# Patient Record
Sex: Male | Born: 1955 | Hispanic: Yes | Marital: Married | Smoking: Never smoker
Health system: Southern US, Community
[De-identification: ages and names within clinical notes are randomized; demographics above are authoritative.]

## PROBLEM LIST (undated history)

## (undated) DIAGNOSIS — I728 Aneurysm of other specified arteries: Secondary | ICD-10-CM

## (undated) DIAGNOSIS — I7121 Aneurysm of the ascending aorta, without rupture: Secondary | ICD-10-CM

## (undated) DIAGNOSIS — N182 Chronic kidney disease, stage 2 (mild): Secondary | ICD-10-CM

## (undated) DIAGNOSIS — M069 Rheumatoid arthritis, unspecified: Secondary | ICD-10-CM

## (undated) DIAGNOSIS — I712 Thoracic aortic aneurysm, without rupture: Secondary | ICD-10-CM

## (undated) DIAGNOSIS — K429 Umbilical hernia without obstruction or gangrene: Secondary | ICD-10-CM

## (undated) DIAGNOSIS — R600 Localized edema: Secondary | ICD-10-CM

## (undated) DIAGNOSIS — I509 Heart failure, unspecified: Secondary | ICD-10-CM

## (undated) DIAGNOSIS — E785 Hyperlipidemia, unspecified: Secondary | ICD-10-CM

## (undated) DIAGNOSIS — Z9989 Dependence on other enabling machines and devices: Secondary | ICD-10-CM

## (undated) DIAGNOSIS — I421 Obstructive hypertrophic cardiomyopathy: Secondary | ICD-10-CM

## (undated) DIAGNOSIS — I493 Ventricular premature depolarization: Secondary | ICD-10-CM

## (undated) DIAGNOSIS — I10 Essential (primary) hypertension: Secondary | ICD-10-CM

## (undated) DIAGNOSIS — M1A9XX0 Chronic gout, unspecified, without tophus (tophi): Secondary | ICD-10-CM

## (undated) DIAGNOSIS — I2510 Atherosclerotic heart disease of native coronary artery without angina pectoris: Secondary | ICD-10-CM

## (undated) DIAGNOSIS — R161 Splenomegaly, not elsewhere classified: Secondary | ICD-10-CM

## (undated) DIAGNOSIS — Z95810 Presence of automatic (implantable) cardiac defibrillator: Secondary | ICD-10-CM

## (undated) DIAGNOSIS — N433 Hydrocele, unspecified: Secondary | ICD-10-CM

## (undated) DIAGNOSIS — I4891 Unspecified atrial fibrillation: Secondary | ICD-10-CM

## (undated) DIAGNOSIS — I700 Atherosclerosis of aorta: Secondary | ICD-10-CM

## (undated) DIAGNOSIS — U071 COVID-19: Secondary | ICD-10-CM

## (undated) DIAGNOSIS — G4733 Obstructive sleep apnea (adult) (pediatric): Secondary | ICD-10-CM

## (undated) DIAGNOSIS — R002 Palpitations: Secondary | ICD-10-CM

## (undated) DIAGNOSIS — J1282 Pneumonia due to coronavirus disease 2019: Secondary | ICD-10-CM

## (undated) HISTORY — DX: Essential (primary) hypertension: I10

## (undated) HISTORY — DX: Aneurysm of the ascending aorta, without rupture: I71.21

## (undated) HISTORY — PX: UMBILICAL HERNIA REPAIR: SHX196

## (undated) HISTORY — DX: Thoracic aortic aneurysm, without rupture: I71.2

## (undated) HISTORY — DX: Chronic kidney disease, stage 2 (mild): N18.2

## (undated) HISTORY — PX: INCISIONAL HERNIA REPAIR: SHX193

## (undated) HISTORY — DX: Aneurysm of other specified arteries: I72.8

## (undated) HISTORY — PX: LAPAROSCOPIC INGUINAL HERNIA REPAIR: SUR788

## (undated) HISTORY — DX: Hyperlipidemia, unspecified: E78.5

## (undated) HISTORY — DX: Obstructive hypertrophic cardiomyopathy: I42.1

## (undated) HISTORY — DX: Umbilical hernia without obstruction or gangrene: K42.9

## (undated) HISTORY — DX: Ventricular premature depolarization: I49.3

---
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
## 2008-12-06 ENCOUNTER — Encounter: Payer: Self-pay | Admitting: Gastroenterology

## 2009-02-23 ENCOUNTER — Ambulatory Visit: Payer: Self-pay

## 2010-11-16 ENCOUNTER — Ambulatory Visit: Payer: Self-pay | Admitting: Cardiology

## 2011-01-11 NOTE — Letter (Signed)
Summary: Pre Visit No Show Letter  [facility]  [address] [place], [place] [address]   Phone: [phone]  Fax: [phone]        December 06, 2008 MRN: [id number]    KAYE MITRO [address] [place], [place]  [address]    Dear Mr. Valenti,   We have been unable to reach you by phone concerning the pre-procedure visit that you missed on 12-06-08. For this reason,your procedure scheduled on          12-17-08  has been cancelled. Our scheduling staff will gladly assist you with rescheduling your appointments at a more convenient time. Please call our office at [phone] between the hours of 8:00am and 5:00pm, press option #2 to reach an appointment scheduler. Please consider updating your contact numbers at this time so that we can reach you by phone in the future with schedule changes or results.    Thank you,    Sherren Kerns RN [facility]

## 2011-03-07 ENCOUNTER — Emergency Department (HOSPITAL_COMMUNITY)
Admission: EM | Admit: 2011-03-07 | Discharge: 2011-03-07 | Disposition: A | Discharge status: Home / Self Care | Payer: BC Managed Care – PPO | Attending: Emergency Medicine | Admitting: Emergency Medicine

## 2011-03-07 DIAGNOSIS — I4949 Other premature depolarization: Secondary | ICD-10-CM | POA: Insufficient documentation

## 2011-03-07 DIAGNOSIS — E876 Hypokalemia: Secondary | ICD-10-CM | POA: Insufficient documentation

## 2011-03-07 DIAGNOSIS — R002 Palpitations: Secondary | ICD-10-CM | POA: Insufficient documentation

## 2011-03-07 DIAGNOSIS — I440 Atrioventricular block, first degree: Secondary | ICD-10-CM | POA: Insufficient documentation

## 2011-03-07 LAB — DIFFERENTIAL
Basophils Absolute: 0 10*3/uL (ref 0.0–0.1)
Basophils Relative: 1 % (ref 0–1)
Eosinophils Absolute: 0.1 10*3/uL (ref 0.0–0.7)
Eosinophils Relative: 1 % (ref 0–5)
Lymphocytes Relative: 26 % (ref 12–46)
Lymphs Abs: 1.5 10*3/uL (ref 0.7–4.0)
Monocytes Absolute: 0.4 10*3/uL (ref 0.1–1.0)
Monocytes Relative: 8 % (ref 3–12)
Neutro Abs: 3.6 10*3/uL (ref 1.7–7.7)
Neutrophils Relative %: 64 % (ref 43–77)

## 2011-03-07 LAB — BASIC METABOLIC PANEL
BUN: 15 mg/dL (ref 6–23)
CO2: 25 mEq/L (ref 19–32)
Calcium: 9.2 mg/dL (ref 8.4–10.5)
Chloride: 105 mEq/L (ref 96–112)
Creatinine, Ser: 1.12 mg/dL (ref 0.4–1.5)
GFR calc Af Amer: >60 mL/min (ref >60)
GFR calc non Af Amer: >60 mL/min (ref >60)
Glucose, Bld: 110 mg/dL — ABNORMAL HIGH (ref 70–99)
Potassium: 3 mEq/L — ABNORMAL LOW (ref 3.5–5.1)
Sodium: 139 mEq/L (ref 135–145)

## 2011-03-07 LAB — CBC
HCT: 40.6 % (ref 39.0–52.0)
Hemoglobin: 14.5 g/dL (ref 13.0–17.0)
MCH: 31.9 pg (ref 26.0–34.0)
MCHC: 35.7 g/dL (ref 30.0–36.0)
MCV: 89.2 fL (ref 78.0–100.0)
Platelets: 195 10*3/uL (ref 150–400)
RBC: 4.55 MIL/uL (ref 4.22–5.81)
RDW: 12.9 % (ref 11.5–15.5)
WBC: 5.6 10*3/uL (ref 4.0–10.5)

## 2012-06-06 ENCOUNTER — Encounter (INDEPENDENT_AMBULATORY_CARE_PROVIDER_SITE_OTHER): Payer: Self-pay | Admitting: General Surgery

## 2012-06-09 ENCOUNTER — Ambulatory Visit (INDEPENDENT_AMBULATORY_CARE_PROVIDER_SITE_OTHER): Payer: BC Managed Care – PPO | Admitting: General Surgery

## 2012-07-22 ENCOUNTER — Encounter (INDEPENDENT_AMBULATORY_CARE_PROVIDER_SITE_OTHER): Payer: Self-pay | Admitting: General Surgery

## 2012-07-23 ENCOUNTER — Ambulatory Visit (INDEPENDENT_AMBULATORY_CARE_PROVIDER_SITE_OTHER): Payer: BC Managed Care – PPO | Admitting: General Surgery

## 2014-04-16 DIAGNOSIS — R079 Chest pain, unspecified: Secondary | ICD-10-CM | POA: Insufficient documentation

## 2014-04-16 DIAGNOSIS — R002 Palpitations: Secondary | ICD-10-CM | POA: Insufficient documentation

## 2014-04-16 DIAGNOSIS — R001 Bradycardia, unspecified: Secondary | ICD-10-CM | POA: Insufficient documentation

## 2014-04-27 DIAGNOSIS — G4730 Sleep apnea, unspecified: Secondary | ICD-10-CM | POA: Insufficient documentation

## 2014-04-27 DIAGNOSIS — E876 Hypokalemia: Secondary | ICD-10-CM

## 2014-04-27 HISTORY — DX: Hypokalemia: E87.6

## 2015-02-22 DIAGNOSIS — M1A0720 Idiopathic chronic gout, left ankle and foot, without tophus (tophi): Secondary | ICD-10-CM | POA: Insufficient documentation

## 2015-02-22 DIAGNOSIS — M1710 Unilateral primary osteoarthritis, unspecified knee: Secondary | ICD-10-CM | POA: Insufficient documentation

## 2015-02-22 DIAGNOSIS — M545 Low back pain, unspecified: Secondary | ICD-10-CM | POA: Insufficient documentation

## 2015-02-22 DIAGNOSIS — I10 Essential (primary) hypertension: Secondary | ICD-10-CM | POA: Insufficient documentation

## 2015-02-22 DIAGNOSIS — R600 Localized edema: Secondary | ICD-10-CM | POA: Insufficient documentation

## 2015-02-22 DIAGNOSIS — Z0000 Encounter for general adult medical examination without abnormal findings: Secondary | ICD-10-CM | POA: Insufficient documentation

## 2015-04-20 DIAGNOSIS — R1031 Right lower quadrant pain: Secondary | ICD-10-CM | POA: Insufficient documentation

## 2015-11-09 ENCOUNTER — Other Ambulatory Visit: Payer: Self-pay

## 2015-11-10 LAB — POTASSIUM: Potassium: 3.6 mmol/L (ref 3.5–5.2)

## 2015-11-10 LAB — URIC ACID: Uric Acid: 7.2 mg/dL (ref 3.7–8.6)

## 2015-12-14 DIAGNOSIS — K635 Polyp of colon: Secondary | ICD-10-CM | POA: Insufficient documentation

## 2015-12-14 DIAGNOSIS — R1031 Right lower quadrant pain: Secondary | ICD-10-CM | POA: Insufficient documentation

## 2015-12-14 DIAGNOSIS — M419 Scoliosis, unspecified: Secondary | ICD-10-CM | POA: Insufficient documentation

## 2016-01-10 DIAGNOSIS — J309 Allergic rhinitis, unspecified: Secondary | ICD-10-CM | POA: Insufficient documentation

## 2016-01-10 DIAGNOSIS — M1A9XX0 Chronic gout, unspecified, without tophus (tophi): Secondary | ICD-10-CM | POA: Insufficient documentation

## 2016-01-10 DIAGNOSIS — G4733 Obstructive sleep apnea (adult) (pediatric): Secondary | ICD-10-CM | POA: Insufficient documentation

## 2016-01-10 DIAGNOSIS — Z79899 Other long term (current) drug therapy: Secondary | ICD-10-CM | POA: Insufficient documentation

## 2016-02-16 ENCOUNTER — Other Ambulatory Visit: Payer: Self-pay | Admitting: Family Medicine

## 2016-02-17 LAB — ELECTROLYTE PANEL
CO2: 19 mmol/L (ref 18–29)
Chloride: 100 mmol/L (ref 96–106)
Potassium: 3.2 mmol/L — ABNORMAL LOW (ref 3.5–5.2)
Sodium: 141 mmol/L (ref 134–144)

## 2016-02-17 LAB — URIC ACID: Uric Acid: 8.3 mg/dL (ref 3.7–8.6)

## 2016-07-10 ENCOUNTER — Other Ambulatory Visit: Payer: Self-pay | Admitting: Family Medicine

## 2016-07-11 LAB — COMPREHENSIVE METABOLIC PANEL
ALT: 22 IU/L (ref 0–44)
AST: 22 IU/L (ref 0–40)
Albumin/Globulin Ratio: 1.9 (ref 1.2–2.2)
Albumin: 4.7 g/dL (ref 3.6–4.8)
Alkaline Phosphatase: 101 IU/L (ref 39–117)
BUN/Creatinine Ratio: 22 (ref 10–24)
BUN: 20 mg/dL (ref 8–27)
Bilirubin Total: 0.5 mg/dL (ref 0.0–1.2)
CO2: 21 mmol/L (ref 18–29)
Calcium: 9.6 mg/dL (ref 8.6–10.2)
Chloride: 98 mmol/L (ref 96–106)
Creatinine, Ser: 0.9 mg/dL (ref 0.76–1.27)
GFR calc Af Amer: 107 mL/min/{1.73_m2} (ref >59)
GFR calc non Af Amer: 93 mL/min/{1.73_m2} (ref >59)
Globulin, Total: 2.5 g/dL (ref 1.5–4.5)
Glucose: 85 mg/dL (ref 65–99)
Potassium: 3.9 mmol/L (ref 3.5–5.2)
Sodium: 139 mmol/L (ref 134–144)
Total Protein: 7.2 g/dL (ref 6.0–8.5)

## 2016-07-11 LAB — URIC ACID: Uric Acid: 9.1 mg/dL — ABNORMAL HIGH (ref 3.7–8.6)

## 2016-09-18 ENCOUNTER — Other Ambulatory Visit: Payer: Self-pay | Admitting: Family Medicine

## 2016-09-19 LAB — CMP12+LP+TP+TSH+6AC+PSA+CBC…
ALT: 28 IU/L (ref 0–44)
AST: 30 IU/L (ref 0–40)
Albumin/Globulin Ratio: 1.8 (ref 1.2–2.2)
Albumin: 4.7 g/dL (ref 3.6–4.8)
Alkaline Phosphatase: 80 IU/L (ref 39–117)
BUN/Creatinine Ratio: 18 (ref 10–24)
BUN: 20 mg/dL (ref 8–27)
Basophils Absolute: 0 10*3/uL (ref 0.0–0.2)
Basos: 1 %
Bilirubin Total: 0.8 mg/dL (ref 0.0–1.2)
Calcium: 9.3 mg/dL (ref 8.6–10.2)
Chloride: 100 mmol/L (ref 96–106)
Chol/HDL Ratio: 5 ratio units (ref 0.0–5.0)
Cholesterol, Total: 196 mg/dL (ref 100–199)
Creatinine, Ser: 1.11 mg/dL (ref 0.76–1.27)
EOS (ABSOLUTE): 0.1 10*3/uL (ref 0.0–0.4)
Eos: 2 %
Estimated CHD Risk: 1 times avg. (ref 0.0–1.0)
Free Thyroxine Index: 2.3 (ref 1.2–4.9)
GFR calc Af Amer: 83 mL/min/{1.73_m2} (ref >59)
GFR calc non Af Amer: 72 mL/min/{1.73_m2} (ref >59)
GGT: 30 IU/L (ref 0–65)
Globulin, Total: 2.6 g/dL (ref 1.5–4.5)
Glucose: 90 mg/dL (ref 65–99)
HDL: 39 mg/dL — ABNORMAL LOW (ref >39)
Hematocrit: 44.9 % (ref 37.5–51.0)
Hemoglobin: 15.6 g/dL (ref 12.6–17.7)
Immature Grans (Abs): 0 10*3/uL (ref 0.0–0.1)
Immature Granulocytes: 0 %
Iron: 149 ug/dL (ref 38–169)
LDH: 248 IU/L — ABNORMAL HIGH (ref 121–224)
LDL Calculated: 128 mg/dL — ABNORMAL HIGH (ref 0–99)
Lymphocytes Absolute: 1.5 10*3/uL (ref 0.7–3.1)
Lymphs: 28 %
MCH: 31.2 pg (ref 26.6–33.0)
MCHC: 34.7 g/dL (ref 31.5–35.7)
MCV: 90 fL (ref 79–97)
Monocytes Absolute: 0.4 10*3/uL (ref 0.1–0.9)
Monocytes: 8 %
Neutrophils Absolute: 3.3 10*3/uL (ref 1.4–7.0)
Neutrophils: 61 %
Phosphorus: 4.1 mg/dL (ref 2.5–4.5)
Platelets: 221 10*3/uL (ref 150–379)
Potassium: 4.1 mmol/L (ref 3.5–5.2)
Prostate Specific Ag, Serum: 1.2 ng/mL (ref 0.0–4.0)
RBC: 5 x10E6/uL (ref 4.14–5.80)
RDW: 13.7 % (ref 12.3–15.4)
Sodium: 139 mmol/L (ref 134–144)
T3 Uptake Ratio: 28 % (ref 24–39)
T4, Total: 8.3 ug/dL (ref 4.5–12.0)
TSH: 1.63 u[IU]/mL (ref 0.450–4.500)
Total Protein: 7.3 g/dL (ref 6.0–8.5)
Triglycerides: 146 mg/dL (ref 0–149)
Uric Acid: 9.4 mg/dL — ABNORMAL HIGH (ref 3.7–8.6)
VLDL Cholesterol Cal: 29 mg/dL (ref 5–40)
WBC: 5.4 10*3/uL (ref 3.4–10.8)

## 2016-09-19 LAB — HGB A1C W/O EAG: Hgb A1c MFr Bld: 5 % (ref 4.8–5.6)

## 2016-10-29 DIAGNOSIS — M25551 Pain in right hip: Secondary | ICD-10-CM | POA: Insufficient documentation

## 2017-01-04 ENCOUNTER — Ambulatory Visit: Payer: Self-pay | Admitting: *Deleted

## 2017-01-04 VITALS — BP 140/88 | HR 53 | Temp 98.8°F

## 2017-01-04 DIAGNOSIS — R0982 Postnasal drip: Secondary | ICD-10-CM

## 2017-01-04 NOTE — Progress Notes (Signed)
Pt in to clinic c/o sore, itchy throat. Sneezing. Denies cough, body aches, chills. Received flu vaccine this year. No fever present or reported. Given cough drops and Benadryl. Use Flonase spray.  F/u from cardiologist visit. Labs WNL in office. Discussed 48hr holter or ziopatch. Declined at that time due to being asymptomatic. F/u visit with cardiology in Feb. Will re-eval need. HR still irregular today, but less frequent irregularities.Return/ER precautions reviewed. No further questions.

## 2017-01-14 ENCOUNTER — Ambulatory Visit: Payer: Self-pay | Admitting: *Deleted

## 2017-01-14 VITALS — BP 154/80 | HR 58

## 2017-01-14 DIAGNOSIS — I10 Essential (primary) hypertension: Secondary | ICD-10-CM

## 2017-01-14 NOTE — Progress Notes (Signed)
Pt in for BP check prior to cardiology f/u appt tomorrow. HR still irregular. Appearing to skip after every 3rd beat. Pt continues to deny sx. Log of BPs sent to pt for appt.

## 2017-02-05 ENCOUNTER — Other Ambulatory Visit: Payer: Self-pay | Admitting: Registered Nurse

## 2017-02-05 ENCOUNTER — Encounter: Payer: Self-pay | Admitting: Registered Nurse

## 2017-02-07 ENCOUNTER — Ambulatory Visit: Payer: Self-pay | Admitting: Registered Nurse

## 2017-02-07 VITALS — BP 160/90 | HR 56

## 2017-02-07 DIAGNOSIS — J00 Acute nasopharyngitis [common cold]: Secondary | ICD-10-CM

## 2017-02-07 DIAGNOSIS — B349 Viral infection, unspecified: Secondary | ICD-10-CM

## 2017-02-07 DIAGNOSIS — R002 Palpitations: Secondary | ICD-10-CM

## 2017-02-07 NOTE — Progress Notes (Signed)
Medication reconciliation performed for medication refills. BP check for refills. Feels fatigued all day. Believes it is r/t the increase in BP med dosage. Denies HTN sx. Reports recent echo normal in setting of irreg HR.   Subjective:    Patient ID: Ryan Glass, male    DOB: 1956/04/29, 61 y.o.   MRN: [id number]  Married 60y/o hispanic male recently had BP medication dose change and echocardiogram for palpitations.  Coworkers sick with URI/flu in building.  He has been feeling fatigued/sweaty on and off with runny nose and muscle aches.  Nose sprays not helping.  Minimal leg swelling when he is taking his lasix.  Denied nausea/vomiting/diarrhea.  PMHx seasonal allergies, hypertension, OSA, colonic polyp, gout, hip pain, lumbago, palpitations, low potassium, hernia PSHx hernia repair and revision      Review of Systems  Constitutional: Positive for fatigue. Negative for activity change, appetite change, chills, diaphoresis, fever and unexpected weight change.  HENT: Positive for congestion, postnasal drip, rhinorrhea and sneezing. Negative for dental problem, drooling, ear discharge, ear pain, facial swelling, hearing loss, mouth sores, nosebleeds, sinus pain, sinus pressure, sore throat, tinnitus, trouble swallowing and voice change.   Eyes: Negative for photophobia, pain, discharge, redness, itching and visual disturbance.  Respiratory: Negative for cough, choking, chest tightness, shortness of breath, wheezing and stridor.   Cardiovascular: Negative for chest pain, palpitations and leg swelling.  Gastrointestinal: Negative for abdominal distention, abdominal pain, blood in stool, constipation, diarrhea, nausea and vomiting.  Endocrine: Negative for cold intolerance and heat intolerance.  Genitourinary: Negative for dysuria.  Musculoskeletal: Positive for myalgias. Negative for arthralgias, back pain, gait problem, joint swelling, neck pain and neck stiffness.  Skin: Negative for color  change, pallor, rash and wound.  Allergic/Immunologic: Positive for environmental allergies. Negative for food allergies and immunocompromised state.  Neurological: Negative for dizziness, tremors, seizures, syncope, facial asymmetry, speech difficulty, weakness, light-headedness, numbness and headaches.  Hematological: Negative for adenopathy. Does not bruise/bleed easily.  Psychiatric/Behavioral: Negative for agitation, behavioral problems, confusion and sleep disturbance.       Objective:   Physical Exam  Constitutional: He is oriented to person, place, and time. Vital signs are normal. He appears well-developed and well-nourished. He is active and cooperative.  Non-toxic appearance. He does not have a sickly appearance. He appears ill. No distress.  HENT:  Head: Normocephalic and atraumatic.  Right Ear: Hearing, external ear and ear canal normal. A middle ear effusion is present.  Left Ear: Hearing, external ear and ear canal normal. A middle ear effusion is present.  Nose: Mucosal edema and rhinorrhea present. No nose lacerations, sinus tenderness, nasal deformity, septal deviation or nasal septal hematoma. No epistaxis.  No foreign bodies. Right sinus exhibits no maxillary sinus tenderness and no frontal sinus tenderness. Left sinus exhibits no maxillary sinus tenderness and no frontal sinus tenderness.  Mouth/Throat: Uvula is midline and mucous membranes are normal. Mucous membranes are not pale, not dry and not cyanotic. He does not have dentures. No oral lesions. No trismus in the jaw. Normal dentition. No dental abscesses, uvula swelling, lacerations or dental caries. Posterior oropharyngeal edema and posterior oropharyngeal erythema present. No oropharyngeal exudate or tonsillar abscesses.  Bilateral allergic shiners; cobblestoning posterior pharynx; bilateral nasal turbinates edema/erythema white discharge; bilateral TMs with air fluid level  Eyes: Conjunctivae, EOM and lids are normal.  Pupils are equal, round, and reactive to light. Right eye exhibits no chemosis, no discharge, no exudate and no hordeolum. No foreign body present in the right eye.  Left eye exhibits no chemosis, no discharge, no exudate and no hordeolum. No foreign body present in the left eye. Right conjunctiva is not injected. Right conjunctiva has no hemorrhage. Left conjunctiva is not injected. Left conjunctiva has no hemorrhage. No scleral icterus. Right eye exhibits normal extraocular motion and no nystagmus. Left eye exhibits normal extraocular motion and no nystagmus. Right pupil is round and reactive. Left pupil is round and reactive. Pupils are equal.  Neck: Trachea normal and normal range of motion. Neck supple. No tracheal tenderness, no spinous process tenderness and no muscular tenderness present. No neck rigidity. No tracheal deviation, no edema, no erythema and normal range of motion present. No thyroid mass and no thyromegaly present.  Cardiovascular: Normal rate, S1 normal, S2 normal, normal heart sounds and intact distal pulses.  An irregularly irregular rhythm present. PMI is not displaced.  Exam reveals no gallop and no friction rub.   No murmur heard. Pulses:      Radial pulses are 2+ on the right side, and 2+ on the left side.  Pulmonary/Chest: Effort normal and breath sounds normal. No stridor. No respiratory distress. He has no decreased breath sounds. He has no wheezes. He has no rhonchi. He has no rales.  sp02 94% room air when speaking  Abdominal: Soft. He exhibits no distension.  Musculoskeletal: Normal range of motion. He exhibits no tenderness.       Right shoulder: Normal.       Left shoulder: Normal.       Right elbow: Normal.      Left elbow: Normal.       Right hip: Normal.       Left hip: Normal.       Right knee: Normal.       Left knee: Normal.       Right ankle: Normal.       Left ankle: Normal.       Cervical back: Normal.       Thoracic back: Normal.       Lumbar back:  Normal.       Right hand: Normal.       Left hand: Normal.       Right lower leg: He exhibits edema.       Left lower leg: He exhibits edema.  Minimal sock line bilateral lower extremities 0-1+/4 edema pitting  Lymphadenopathy:       Head (right side): No submental, no submandibular, no tonsillar, no preauricular, no posterior auricular and no occipital adenopathy present.       Head (left side): No submental, no submandibular, no tonsillar, no preauricular, no posterior auricular and no occipital adenopathy present.    He has no cervical adenopathy.       Right cervical: No superficial cervical, no deep cervical and no posterior cervical adenopathy present.      Left cervical: No superficial cervical, no deep cervical and no posterior cervical adenopathy present.  Neurological: He is alert and oriented to person, place, and time. He displays no atrophy and no tremor. No cranial nerve deficit or sensory deficit. He exhibits normal muscle tone. He displays no seizure activity. Coordination and gait normal. GCS eye subscore is 4. GCS verbal subscore is 5. GCS motor subscore is 6.  Skin: Skin is warm, dry and intact. No abrasion, no bruising, no burn, no ecchymosis, no laceration, no lesion, no petechiae and no rash noted. He is not diaphoretic. No cyanosis or erythema. No pallor. Nails show no clubbing.  Psychiatric: He has a normal mood and affect. His speech is normal and behavior is normal. Judgment and thought content normal. Cognition and memory are normal.  Nursing note and vitals reviewed.         Assessment & Plan:  A-acute rhinitis, viral illness, palpitations  P-Continue flonase 1 spray each nostril BID #1 RF0 Rx given from [facility], saline 2 sprays each nostril q2h prn congestion given 1 UD bottle from clinic stock. Shower twice a day.  Has diclofenac for pain.  Hydrate, rest, elevate legs. If no improvement with plan of care or worsening follow up with [facility] or provider for re-evaluation  .  No evidence of systemic bacterial infection, non toxic and well hydrated.  I do not see where any further testing or imaging is necessary at this time.   I will suggest supportive care, rest, good hygiene and encourage the patient to take adequate fluids.  The patient is to return to clinic or EMERGENCY ROOM if symptoms worsen or change significantly.  Patient verbalized agreement and understanding of treatment plan and had no further questions at this time.   P2:  Hand washing and cover cough  Patient recent EKG and echocardiogram for palpitations normal.  Had cardiology appt.  Discussed palpitations can be caused by medications, electrolyte levels being out of normal range, caffeine and/or illness.  Follow up with cardiology for re-evaluation if worsening in duration/frequency. Consider lab tests as last drawn January 2018. Patient verbalized understanding of information/instructions, agreed with plan of care and had no further questions at this time.

## 2017-02-07 NOTE — Patient Instructions (Addendum)
Start nasal saline 2 sprays each nostril every 2 hours while awake as needed Shower twice a day Continue flonase 1 spray each nostril twice a day Suspect viral illness/and start of seasonal allergy flare If nasonex at home could stop flonase and restart nasonex to see if that works better  Follow up with cardiology if worsening duration or frequency of palpitations/irregular heart beat

## 2017-02-28 ENCOUNTER — Ambulatory Visit: Payer: Self-pay | Admitting: *Deleted

## 2017-02-28 VITALS — BP 154/96 | HR 61

## 2017-02-28 DIAGNOSIS — I10 Essential (primary) hypertension: Secondary | ICD-10-CM

## 2017-02-28 DIAGNOSIS — R5383 Other fatigue: Secondary | ICD-10-CM

## 2017-02-28 NOTE — Progress Notes (Signed)
12:40: Fax received from Dr. America Brown office regarding encounter that was routed to MD. Atenolol "Change back to once daily" per Dr. Lady Gary. Tobi Bastos, CMA contacted pt and informed him and this RN spoke to pt as well. Pt verbalizes understanding to reduce dose back to once daily. No f/u instructions given per Dr. Lady Gary. Asked pt to see RN in one week for BP and HR check. He agrees. No further questions/concerns. Hard copy fax placed in pt's chart.

## 2017-02-28 NOTE — Progress Notes (Signed)
Pt requests BP check. Med change 01/15/17. Atenolol 25mg  increased from once to twice a day. Pt reports extreme fatigue all day now, even to the point of dozing off while driving one morning. Normally to bed at 11pm, now falling asleep early evening with difficulty getting up. Pulse now regular and WNL, 61. No irregularities noted. Pt believes fatigue is r/t dosage increase as it began shortly after starting new dose. Encouraged him to call Dr. office to discuss other options or need for OV. Pt verbalizes understanding and agreement.

## 2017-03-05 ENCOUNTER — Ambulatory Visit: Payer: Self-pay | Admitting: Registered Nurse

## 2017-03-05 VITALS — BP 154/90 | HR 57

## 2017-03-05 DIAGNOSIS — J301 Allergic rhinitis due to pollen: Secondary | ICD-10-CM

## 2017-03-05 DIAGNOSIS — H8113 Benign paroxysmal vertigo, bilateral: Secondary | ICD-10-CM

## 2017-03-05 DIAGNOSIS — H6593 Unspecified nonsuppurative otitis media, bilateral: Secondary | ICD-10-CM

## 2017-03-05 DIAGNOSIS — I10 Essential (primary) hypertension: Secondary | ICD-10-CM

## 2017-03-05 DIAGNOSIS — R002 Palpitations: Secondary | ICD-10-CM

## 2017-03-05 NOTE — Patient Instructions (Addendum)
Take medications as prescribed--refilled amlodipine 10mg  po daily #30 RF0 [facility] for patient contact PCM/cardiology office as patient would like to restart amlodipine 5mg  po BID instead of 10mg  po daily Contact cardiology/Dr Fath and notify him palpitations worsening ER if chest pain, shortness of breath, loss of consciousness, worst headache of life and/or visual changes  Allergic Rhinitis Allergic rhinitis is when the mucous membranes in the nose respond to allergens. Allergens are particles in the air that cause your body to have an allergic reaction. This causes you to release allergic antibodies. Through a chain of events, these eventually cause you to release histamine into the blood stream. Although meant to protect the body, it is this release of histamine that causes your discomfort, such as frequent sneezing, congestion, and an itchy, runny nose. What are the causes? Seasonal allergic rhinitis (hay fever) is caused by pollen allergens that may come from grasses, trees, and weeds. Year-round allergic rhinitis (perennial allergic rhinitis) is caused by allergens such as house dust mites, pet dander, and mold spores. What are the signs or symptoms? Nasal stuffiness (congestion). Itchy, runny nose with sneezing and tearing of the eyes. How is this diagnosed? Your health care provider can help you determine the allergen or allergens that trigger your symptoms. If you and your health care provider are unable to determine the allergen, skin or blood testing may be used. Your health care provider will diagnose your condition after taking your health history and performing a physical exam. Your health care provider may assess you for other related conditions, such as asthma, pink eye, or an ear infection. How is this treated? Allergic rhinitis does not have a cure, but it can be controlled by: Medicines that block allergy symptoms. These may include allergy shots, nasal sprays, and oral  antihistamines. Avoiding the allergen. Hay fever may often be treated with antihistamines in pill or nasal spray forms. Antihistamines block the effects of histamine. There are over-the-counter medicines that may help with nasal congestion and swelling around the eyes. Check with your health care provider before taking or giving this medicine. If avoiding the allergen or the medicine prescribed do not work, there are many new medicines your health care provider can prescribe. Stronger medicine may be used if initial measures are ineffective. Desensitizing injections can be used if medicine and avoidance does not work. Desensitization is when a patient is given ongoing shots until the body becomes less sensitive to the allergen. Make sure you follow up with your health care provider if problems continue. Follow these instructions at home: It is not possible to completely avoid allergens, but you can reduce your symptoms by taking steps to limit your exposure to them. It helps to know exactly what you are allergic to so that you can avoid your specific triggers. Contact a health care provider if: You have a fever. You develop a cough that does not stop easily (persistent). You have shortness of breath. You start wheezing. Symptoms interfere with normal daily activities. This information is not intended to replace advice given to you by your health care provider. Make sure you discuss any questions you have with your health care provider. Document Released: 08/21/2001 Document Revised: 07/27/2016 Document Reviewed: 08/03/2013 Elsevier Interactive Patient Education  2017 [employer].  Palpitations A palpitation is the feeling that your heartbeat is irregular or is faster than normal. It may feel like your heart is fluttering or skipping a beat. Palpitations are usually not a serious problem. They may be caused by  many things, including smoking, caffeine, alcohol, stress, and certain medicines.  Although most causes of palpitations are not serious, palpitations can be a sign of a serious medical problem. In some cases, you may need further medical evaluation. Follow these instructions at home: Pay attention to any changes in your symptoms. Take these actions to help with your condition:  Avoid the following:  Caffeinated coffee, tea, soft drinks, diet pills, and energy drinks.  Chocolate.  Alcohol.  Do not use any tobacco products, such as cigarettes, chewing tobacco, and e-cigarettes. If you need help quitting, ask your health care provider.  Try to reduce your stress and anxiety. Things that can help you relax include:  Yoga.  Meditation.  Physical activity, such as swimming, jogging, or walking.  Biofeedback. This is a method that helps you learn to use your mind to control things in your body, such as your heartbeats.  Get plenty of rest and sleep.  Take over-the-counter and prescription medicines only as told by your health care provider.  Keep all follow-up visits as told by your health care provider. This is important. Contact a health care provider if:  You continue to have a fast or irregular heartbeat after 24 hours.  Your palpitations occur more often. Get help right away if:  You have chest pain or shortness of breath.  You have a severe headache.  You feel dizzy or you faint. This information is not intended to replace advice given to you by your health care provider. Make sure you discuss any questions you have with your health care provider. Document Released: 11/23/2000 Document Revised: 04/30/2016 Document Reviewed: 08/11/2015 Elsevier Interactive Patient Education  2017 [employer]  Benign Positional Vertigo Vertigo is the feeling that you or your surroundings are moving when they are not. Benign positional vertigo is the most common form of vertigo. The cause of this condition is not serious (is benign). This condition is triggered by  certain movements and positions (is positional). This condition can be dangerous if it occurs while you are doing something that could endanger you or others, such as driving. What are the causes? In many cases, the cause of this condition is not known. It may be caused by a disturbance in an area of the inner ear that helps your brain to sense movement and balance. This disturbance can be caused by a viral infection (labyrinthitis), head injury, or repetitive motion. What increases the risk? This condition is more likely to develop in:  Women.  People who are 30 years of age or older. What are the signs or symptoms? Symptoms of this condition usually happen when you move your head or your eyes in different directions. Symptoms may start suddenly, and they usually last for less than a minute. Symptoms may include:  Loss of balance and falling.  Feeling like you are spinning or moving.  Feeling like your surroundings are spinning or moving.  Nausea and vomiting.  Blurred vision.  Dizziness.  Involuntary eye movement (nystagmus). Symptoms can be mild and cause only slight annoyance, or they can be severe and interfere with daily life. Episodes of benign positional vertigo may return (recur) over time, and they may be triggered by certain movements. Symptoms may improve over time. How is this diagnosed? This condition is usually diagnosed by medical history and a physical exam of the head, neck, and ears. You may be referred to a health care provider who specializes in ear, nose, and throat (ENT) problems (otolaryngologist) or a provider  who specializes in disorders of the nervous system (neurologist). You may have additional testing, including:  MRI.  A CT scan.  Eye movement tests. Your health care provider may ask you to change positions quickly while he or she watches you for symptoms of benign positional vertigo, such as nystagmus. Eye movement may be tested with an  electronystagmogram (ENG), caloric stimulation, the Dix-Hallpike test, or the roll test.  An electroencephalogram (EEG). This records electrical activity in your brain.  Hearing tests. How is this treated? Usually, your health care provider will treat this by moving your head in specific positions to adjust your inner ear back to normal. Surgery may be needed in severe cases, but this is rare. In some cases, benign positional vertigo may resolve on its own in 2-4 weeks. Follow these instructions at home: Safety   Move slowly.Avoid sudden body or head movements.  Avoid driving.  Avoid operating heavy machinery.  Avoid doing any tasks that would be dangerous to you or others if a vertigo episode would occur.  If you have trouble walking or keeping your balance, try using a cane for stability. If you feel dizzy or unstable, sit down right away.  Return to your normal activities as told by your health care provider. Ask your health care provider what activities are safe for you. General instructions   Take over-the-counter and prescription medicines only as told by your health care provider.  Avoid certain positions or movements as told by your health care provider.  Drink enough fluid to keep your urine clear or pale yellow.  Keep all follow-up visits as told by your health care provider. This is important. Contact a health care provider if:  You have a fever.  Your condition gets worse or you develop new symptoms.  Your family or friends notice any behavioral changes.  Your nausea or vomiting gets worse.  You have numbness or a "pins and needles" sensation. Get help right away if:  You have difficulty speaking or moving.  You are always dizzy.  You faint.  You develop severe headaches.  You have weakness in your legs or arms.  You have changes in your hearing or vision.  You develop a stiff neck.  You develop sensitivity to light. This information is not  intended to replace advice given to you by your health care provider. Make sure you discuss any questions you have with your health care provider. Document Released: 09/03/2006 Document Revised: 05/03/2016 Document Reviewed: 03/21/2015 Elsevier Interactive Patient Education  2017 [employer].

## 2017-03-05 NOTE — Progress Notes (Signed)
Subjective:    Patient ID: Ryan Glass, male    DOB: 15-Jul-1956, 61 y.o.   MRN: [id number]  60y/o married hispanic male here for not feeling well, dizzyness and palpitations again. Had normal echocardiogram with cardiology Dr Lady Gary at [facility] earlier this year.  Dr Lady Gary prescribed for him to take atenolol 25mg  po BID but patient did not tolerate dosing--falling asleep at work and while driving February so Dr March adjusted dose back to 25mg  po daily.  Patient was feeling better until he ran out of amlodipine last week.  Here today requesting refill of his amlodipine as cannot find his bottle at home.  Last filled January 2018 per paper chart review [facility].  Patient stated ran out of amlodipine 5 days ago still taking all other medications as prescribed. Patient asking if I could change his dosing to 5mg  po BID instead of 10mg  po daily.  Patient reported he has less leg swelling when he was on BID 5mg  dosing.  Patient reported if he takes amlodipine in am has afternoon leg swelling.  If he takes in evening has less leg swelling in am then what occurs in pm with am dosing.  Least leg swelling when he splits dose and takes amlodipine BID. Seasonal allergies acting up also has been using flonase and nasal saline with some relief of symptoms.  Hasn't tried showering prior to bed.  Denied recent illness/cold, swelling of hands/feet, passing out/LOC, visual changes, headache, fever, chills, ear discharge, or rash.      Review of Systems  Constitutional: Negative for activity change, appetite change, chills, diaphoresis, fatigue, fever and unexpected weight change.  HENT: Positive for congestion, postnasal drip and rhinorrhea. Negative for dental problem, drooling, ear discharge, ear pain, facial swelling, hearing loss, mouth sores, nosebleeds, sinus pain, sinus pressure, sneezing, sore throat, tinnitus, trouble swallowing and voice change.   Eyes: Negative for photophobia, pain,  discharge, redness, itching and visual disturbance.  Respiratory: Negative for cough, choking, chest tightness, shortness of breath, wheezing and stridor.   Cardiovascular: Positive for palpitations and leg swelling. Negative for chest pain.  Gastrointestinal: Negative for abdominal distention, abdominal pain, blood in stool, constipation, diarrhea, nausea and vomiting.  Endocrine: Negative for cold intolerance and heat intolerance.  Genitourinary: Negative for dysuria.  Musculoskeletal: Negative for arthralgias, back pain, gait problem, joint swelling, myalgias, neck pain and neck stiffness.  Skin: Negative for color change, pallor, rash and wound.  Allergic/Immunologic: Positive for environmental allergies. Negative for food allergies and immunocompromised state.  Neurological: Positive for dizziness and headaches. Negative for tremors, seizures, syncope, facial asymmetry, speech difficulty, weakness, light-headedness and numbness.  Hematological: Negative for adenopathy. Does not bruise/bleed easily.  Psychiatric/Behavioral: Negative for agitation, behavioral problems, confusion and sleep disturbance.       Objective:   Physical Exam  Constitutional: He is oriented to person, place, and time. Vital signs are normal. He appears well-developed and well-nourished. He is active and cooperative.  Non-toxic appearance. He does not have a sickly appearance. He does not appear ill. No distress.  HENT:  Head: Normocephalic and atraumatic.  Right Ear: Hearing, external ear and ear canal normal. A middle ear effusion is present.  Left Ear: Hearing, external ear and ear canal normal. A middle ear effusion is present.  Nose: Mucosal edema and rhinorrhea present. No nose lacerations, sinus tenderness, nasal deformity, septal deviation or nasal septal hematoma. No epistaxis.  No foreign bodies. Right sinus exhibits no maxillary sinus tenderness and no  frontal sinus tenderness. Left sinus exhibits no  maxillary sinus tenderness and no frontal sinus tenderness.  Mouth/Throat: Uvula is midline and mucous membranes are normal. Mucous membranes are not pale, not dry and not cyanotic. He does not have dentures. No oral lesions. No trismus in the jaw. Normal dentition. No dental abscesses, uvula swelling, lacerations or dental caries. Posterior oropharyngeal edema and posterior oropharyngeal erythema present. No oropharyngeal exudate or tonsillar abscesses.  Cobblestoning posterior pharynx; bilateral allergic shiners; bilateral TMs air fluid level clear; bilateral nasal turbinates edema/erythema clear discharge  Eyes: Conjunctivae, EOM and lids are normal. Pupils are equal, round, and reactive to light. Right eye exhibits no chemosis, no discharge, no exudate and no hordeolum. No foreign body present in the right eye. Left eye exhibits no chemosis, no discharge, no exudate and no hordeolum. No foreign body present in the left eye. Right conjunctiva is not injected. Right conjunctiva has no hemorrhage. Left conjunctiva is not injected. Left conjunctiva has no hemorrhage. No scleral icterus. Right eye exhibits normal extraocular motion and no nystagmus. Left eye exhibits normal extraocular motion and no nystagmus. Right pupil is round and reactive. Left pupil is round and reactive. Pupils are equal.  Neck: Trachea normal and normal range of motion. Neck supple. No tracheal tenderness, no spinous process tenderness and no muscular tenderness present. No neck rigidity. No tracheal deviation, no edema, no erythema and normal range of motion present. No thyroid mass and no thyromegaly present.  Cardiovascular: Normal rate, S1 normal, S2 normal and intact distal pulses.  An irregularly irregular rhythm present. PMI is not displaced.   Pulses:      Radial pulses are 2+ on the right side, and 2+ on the left side.  Pulmonary/Chest: Effort normal and breath sounds normal. No stridor. No respiratory distress. He has no  decreased breath sounds. He has no wheezes. He has no rhonchi. He has no rales.  Abdominal: Soft. He exhibits no distension.  Musculoskeletal: Normal range of motion. He exhibits no edema or tenderness.       Right shoulder: Normal.       Left shoulder: Normal.       Right elbow: Normal.      Left elbow: Normal.       Right hip: Normal.       Left hip: Normal.       Right knee: Normal.       Left knee: Normal.       Right hand: Normal.       Left hand: Normal.  Lymphadenopathy:       Head (right side): No submental, no submandibular, no tonsillar, no preauricular, no posterior auricular and no occipital adenopathy present.       Head (left side): No submental, no submandibular, no tonsillar, no preauricular, no posterior auricular and no occipital adenopathy present.    He has no cervical adenopathy.       Right cervical: No superficial cervical, no deep cervical and no posterior cervical adenopathy present.      Left cervical: No superficial cervical, no deep cervical and no posterior cervical adenopathy present.  Neurological: He is alert and oriented to person, place, and time. He has normal strength. He is not disoriented. He displays no atrophy and no tremor. No cranial nerve deficit or sensory deficit. He exhibits normal muscle tone. He displays no seizure activity. Coordination and gait normal. GCS eye subscore is 4. GCS verbal subscore is 5. GCS motor subscore is 6.  Bilateral hand  grasp equal 5/5; lower extremities 5/5 gait sure and steady in hallway; in/out chair without difficulty  Skin: Skin is warm, dry and intact. No abrasion, no bruising, no burn, no ecchymosis, no laceration, no lesion, no petechiae and no rash noted. He is not diaphoretic. No cyanosis or erythema. No pallor. Nails show no clubbing.  Psychiatric: He has a normal mood and affect. His speech is normal and behavior is normal. Judgment and thought content normal. Cognition and memory are normal.  Nursing note and  vitals reviewed.         Assessment & Plan:  A-Palpitations, Hypertension, Bilateral Otitis Media Effusion; vertigo; seasonal allergic rhinitis  P-Notify PCM/Cardiology Dr Lady Gary Palpitations worsening.  Had run out of amlodipine 10mg  po daily 5 days ago.  Filled 30 days supply for patient from [facility] today and he is to restart medication and follow [facility] with [facility] for BP rechecks this week.  Discussed palpitations could be due to his running out of amlodipine, electrolytes being high or low, caffeine, stress, illness.  Patient to go to ER if worsening, loss of consciousness, visual changes, chest pain, dyspnea/shortness of breath for EKG and labs.  Discussed with patient [facility] does not have EKG machine or same day lab capability.  Patient reported he will follow up with PCM/cardiology regarding amlodipine Rx to see if they will change Rx to 5mg  po BID dosing.  Take medications as presribed by PCM/cardiology.  Patient verbalized understanding information/instructions, agreed with plan of care and had no further questions at this time.  Continue current medications as directed.  Continue to monitor blood pressure at home and maintain log of blood pressure and pulse to bring to follow up appointments.  Continue low sodium diet and exercise program.  Recommended weight loss/weight maintenance to BMI 20-25.  Return to the clinic if any new symptoms.  Patient verbalized agreement and understanding of treatment plan and had no further questions at this time.    Discussed with patient otitis media with effusion probably causing vertigo but could also be age, palpitations, ran out of amlopidine, seasonal allergies.   Discussed signs/symptoms stroke.  Follow up if aphasia, dysphasia, visual changes, weakness, fall, worst headache of life, incoordination, fever, ear discharge. Holding meclizine/antihistamines at this time due to palpitations. Follow up if no  improvement after restarting seasonal allergy medications, amlodipine and showering twice a day plus hydrating. Patient verbalized understanding of information/agreed with plan of care and had no further questions at this time.  Supportive treatment.   No evidence of invasive bacterial infection, non toxic and well hydrated.  This is most likely self limiting viral infection.  I do not see where any further testing or imaging is necessary at this time.   I will suggest supportive care, rest, good hygiene and encourage the patient to take adequate fluids.  The patient is to return to clinic or EMERGENCY ROOM if symptoms worsen or change significantly e.g. ear pain, fever, purulent discharge from ears or bleeding.  Exitcare handout on otitis media with effusion given to patient.  Patient verbalized agreement and understanding of treatment plan.    Patient may use normal saline nasal spray as needed.  Flonase 1 spray each nostril BID at home.  Consider antihistamine but at this time did not recommend due to palpitations.  Other alternative singulair 10mg  po qhs if no improvement with shower at bedtime.  Consider antihistamine or nasal steroid use.  Avoid triggers if possible.  Shower prior  to bedtime if exposed to triggers.  If allergic dust/dust mites recommend mattress/pillow covers/encasements; washing linens, vacuuming, sweeping, dusting weekly.  Call or return to clinic as needed if these symptoms worsen or fail to improve as anticipated.   Exitcare handout on allergic rhinitis given to patient.  Patient verbalized understanding of instructions, agreed with plan of care and had no further questions at this time.  P2:  Avoidance and hand washing.

## 2017-03-08 ENCOUNTER — Ambulatory Visit: Payer: Self-pay | Admitting: *Deleted

## 2017-03-08 VITALS — BP 160/90

## 2017-03-08 DIAGNOSIS — I10 Essential (primary) hypertension: Secondary | ICD-10-CM

## 2017-03-08 NOTE — Progress Notes (Signed)
BP check. Elevated but improved from yesterday. Took extra atenolol dose yesterday afternoon after work. Regular dose at night and extra dose this am. Not as fatigued as he was previously on BID dosing. No palpitations and pulse regular today as opposed to having PVCs yesterday. Is going to try to go back to BID dosing of atenolol to control palpitations.

## 2017-03-18 ENCOUNTER — Ambulatory Visit: Payer: Self-pay | Admitting: *Deleted

## 2017-03-18 VITALS — BP 138/86 | HR 69

## 2017-03-18 DIAGNOSIS — I10 Essential (primary) hypertension: Secondary | ICD-10-CM

## 2017-03-18 NOTE — Progress Notes (Signed)
BP check. Irregular HR returned but PVCs occurring less often. Pt asymptomatic. Feels well on BP med regimen. Returned to twice daily atenolol. Plans to f/u with cardiology in 3-6 months and continue with interval BP checks in clinic.

## 2017-04-04 ENCOUNTER — Ambulatory Visit: Payer: Self-pay | Admitting: Registered Nurse

## 2017-04-04 VITALS — BP 152/105 | HR 57 | Temp 98.1°F | Wt 256.0 lb

## 2017-04-04 DIAGNOSIS — M7989 Other specified soft tissue disorders: Secondary | ICD-10-CM

## 2017-04-04 DIAGNOSIS — R002 Palpitations: Secondary | ICD-10-CM

## 2017-04-04 NOTE — Progress Notes (Signed)
Subjective:    Patient ID: Ryan Glass, male    DOB: 30-Jul-1956, 61 y.o.   MRN: [id number]  61y/o married hispanic male presents to clinic with c/o SOB, fatigue, BLE edema. Reports his amlodipine causes leg swelling normally so he did not take it today so it would not get worse. Normally trace-+1 edema, today +4 BLE.  Pt reports normally able to walk up 15-20 stairs without SOB, today only 3-4 stairs. Feels like he can't catch his breath, needs deeper breaths. Denies chest pain/pressure. Pulse regular today, irregular in recent past due to PVCs. Has taken Furosemide today. 256# today. Last recorded weight 254# at cardiology Office visit 01/15/17.   Allergies acting up had stopped nose spray as cardiology told him could be causing his palpations      Review of Systems  Constitutional: Positive for fatigue. Negative for activity change, appetite change, chills, diaphoresis, fever and unexpected weight change.  HENT: Positive for congestion, postnasal drip and rhinorrhea. Negative for dental problem, drooling, ear discharge, ear pain, facial swelling, hearing loss, mouth sores, nosebleeds, sinus pain, sinus pressure, sneezing, sore throat, tinnitus, trouble swallowing and voice change.   Eyes: Negative for photophobia, pain, discharge, redness, itching and visual disturbance.  Respiratory: Positive for shortness of breath. Negative for cough, choking, chest tightness, wheezing and stridor.   Cardiovascular: Negative for chest pain, palpitations and leg swelling.  Gastrointestinal: Negative for abdominal distention, abdominal pain, blood in stool, constipation, diarrhea, nausea and vomiting.  Endocrine: Negative for cold intolerance and heat intolerance.  Genitourinary: Negative for dysuria.  Musculoskeletal: Positive for joint swelling. Negative for arthralgias, back pain, gait problem, myalgias, neck pain and neck stiffness.  Skin: Negative for color change, pallor, rash and wound.   Allergic/Immunologic: Positive for environmental allergies. Negative for food allergies and immunocompromised state.  Neurological: Negative for dizziness, tremors, seizures, syncope, facial asymmetry, speech difficulty, weakness, light-headedness, numbness and headaches.  Hematological: Negative for adenopathy. Does not bruise/bleed easily.  Psychiatric/Behavioral: Negative for agitation, behavioral problems, confusion and sleep disturbance.       Objective:   Physical Exam  Constitutional: He is oriented to person, place, and time. He appears well-developed and well-nourished. He is active and cooperative.  Non-toxic appearance. He does not have a sickly appearance. He appears ill. No distress.  HENT:  Head: Normocephalic and atraumatic.  Right Ear: Hearing, external ear and ear canal normal. A middle ear effusion is present.  Left Ear: Hearing, external ear and ear canal normal. A middle ear effusion is present.  Nose: Mucosal edema and rhinorrhea present. No nose lacerations, sinus tenderness, nasal deformity, septal deviation or nasal septal hematoma. No epistaxis.  No foreign bodies. Right sinus exhibits no maxillary sinus tenderness and no frontal sinus tenderness. Left sinus exhibits no maxillary sinus tenderness and no frontal sinus tenderness.  Mouth/Throat: Uvula is midline and mucous membranes are normal. Mucous membranes are not pale, not dry and not cyanotic. He does not have dentures. No oral lesions. No trismus in the jaw. Normal dentition. No dental abscesses, uvula swelling, lacerations or dental caries. Posterior oropharyngeal edema and posterior oropharyngeal erythema present. No oropharyngeal exudate or tonsillar abscesses.  Cobblestoning posterior pharynx; bilateral TMs air fluid level clear; bilateral nasal turbinates edema/erythema clear discharge; bilateral allergic shiners  Eyes: Conjunctivae, EOM and lids are normal. Pupils are equal, round, and reactive to light. Right  eye exhibits no chemosis, no discharge, no exudate and no hordeolum. No foreign body present in the right eye. Left eye exhibits  no chemosis, no discharge, no exudate and no hordeolum. No foreign body present in the left eye. Right conjunctiva is not injected. Right conjunctiva has no hemorrhage. Left conjunctiva is not injected. Left conjunctiva has no hemorrhage. No scleral icterus. Right eye exhibits normal extraocular motion and no nystagmus. Left eye exhibits normal extraocular motion and no nystagmus. Right pupil is round and reactive. Left pupil is round and reactive. Pupils are equal.  Neck: Trachea normal and normal range of motion. Neck supple. No tracheal tenderness, no spinous process tenderness and no muscular tenderness present. No neck rigidity. No tracheal deviation, no edema, no erythema and normal range of motion present. No thyroid mass and no thyromegaly present.  Cardiovascular: Normal rate, S1 normal, S2 normal, normal heart sounds and intact distal pulses.  An irregularly irregular rhythm present. PMI is not displaced.  Exam reveals no gallop and no friction rub.   No murmur heard. Pulses:      Radial pulses are 2+ on the right side, and 2+ on the left side.  In 30 seconds had 4 beats pause; 12 beats pause; 5 beats pause; 4 beats pause; 8 beats pause; pitting edema to 2 inches below bilateral knees wearing ankle socks and tennies today  Pulmonary/Chest: Effort normal and breath sounds normal. No stridor. No respiratory distress. He has no decreased breath sounds. He has no wheezes. He has no rhonchi. He has no rales. He exhibits no tenderness.  Speaks full sentences but gets easily winded with activity sp02 within usual range  Abdominal: Soft. He exhibits no distension.  Musculoskeletal: Normal range of motion. He exhibits edema. He exhibits no tenderness or deformity.       Right shoulder: Normal.       Left shoulder: Normal.       Right elbow: Normal.      Left elbow: Normal.        Right hip: Normal.       Left hip: Normal.       Right knee: Normal.       Left knee: Normal.       Right ankle: He exhibits swelling and ecchymosis. He exhibits normal range of motion, no deformity, no laceration and normal pulse. Achilles tendon normal.       Left ankle: He exhibits swelling and ecchymosis. He exhibits normal range of motion, no deformity, no laceration and normal pulse. Achilles tendon normal.       Cervical back: Normal.       Right hand: Normal.       Left hand: Normal.       Right lower leg: He exhibits edema. He exhibits no tenderness, no bony tenderness, no swelling, no deformity and no laceration.       Left lower leg: He exhibits edema. He exhibits no tenderness, no bony tenderness, no swelling, no deformity and no laceration.  Lymphadenopathy:       Head (right side): No submental, no submandibular, no tonsillar, no preauricular, no posterior auricular and no occipital adenopathy present.       Head (left side): No submental, no submandibular, no tonsillar, no preauricular, no posterior auricular and no occipital adenopathy present.    He has no cervical adenopathy.       Right cervical: No superficial cervical, no deep cervical and no posterior cervical adenopathy present.      Left cervical: No superficial cervical, no deep cervical and no posterior cervical adenopathy present.  Neurological: He is alert and oriented to person,  place, and time. He is not disoriented. He displays no atrophy and no tremor. No cranial nerve deficit or sensory deficit. He exhibits normal muscle tone. He displays no seizure activity. Coordination and gait normal. GCS eye subscore is 4. GCS verbal subscore is 5. GCS motor subscore is 6.  Skin: Skin is warm, dry and intact. No abrasion, no bruising, no burn, no ecchymosis, no laceration, no lesion, no petechiae and no rash noted. He is not diaphoretic. No cyanosis or erythema. No pallor. Nails show no clubbing.  Psychiatric: He has a  normal mood and affect. His speech is normal and behavior is normal. Judgment and thought content normal. Cognition and memory are normal.  Nursing note and vitals reviewed.         Assessment & Plan:  A-hypertension, leg swelling, palpitations  P-contact cardiologist office regarding leg swelling and palpitations and weight gain.  Discussed they may increase his lasix dose and to discuss amlodipine dosing/change.  Labs today CMP and magnesium venipuncture x 1 drawn by RN Rolly Salter workman.  Avoid added sodium today.  Hydrate, elevate legs if able.  ER if chest pain, worsening dyspnea with exertion, worsening palpitations.  Continue current medications as prescribed.  Follow up with Rolly Salter tomorrow to review lab results  Continue to monitor blood pressure at home and maintain log of blood pressure and pulse to bring to follow up appointments.  Continue low sodium diet and exercise program.  Recommended weight loss/weight maintenance to BMI 20-25.  Return to the clinic if any new symptoms.  Patient verbalized agreement and understanding of treatment plan and had no further questions at this time.   P2:  Diet and Exercise specific for HTN

## 2017-04-04 NOTE — Patient Instructions (Addendum)
Call cardiologist office and discuss weight gain/worsening swelling legs and palpitations/irregular heart beat Avoid extra salt/caffeine today Blood draw today to check kidney function/electrolytes (potassium/magnesium) Follow up with Rolly Salter RN tomorrow for lab results and repeat weight/BP ER tonight if worsening palpitations, difficulty breathing, chest pain  Heart Failure Heart failure is a condition in which the heart has trouble pumping blood because it has become weak or stiff. This means that the heart does not pump blood efficiently for the body to work well. For some people with heart failure, fluid may back up into the lungs and there may be swelling (edema) in the lower legs. Heart failure is usually a long-term (chronic) condition. It is important for you to take good care of yourself and follow the treatment plan from your health care provider. What are the causes? This condition is caused by some health problems, including:  High blood pressure (hypertension). Hypertension causes the heart muscle to work harder than normal. High blood pressure eventually causes the heart to become stiff and weak.  Coronary artery disease (CAD). CAD is the buildup of cholesterol and fat (plaques) in the arteries of the heart.  Heart attack (myocardial infarction). Injured tissue, which is caused by the heart attack, does not contract as well and the heart's ability to pump blood is weakened.  Abnormal heart valves. When the heart valves do not open and close properly, the heart muscle must pump harder to keep the blood flowing.  Heart muscle disease (cardiomyopathy or myocarditis). Heart muscle disease is damage to the heart muscle from a variety of causes, such as drug or alcohol abuse, infections, or unknown causes. These can increase the risk of heart failure.  Lung disease. When the lungs do not work properly, the heart must work harder. What increases the risk? Risk of heart failure increases  as a person ages. This condition is also more likely to develop in people who:  Are overweight.  Are male.  Smoke or chew tobacco.  Abuse alcohol or illegal drugs.  Have taken medicines that can damage the heart, such as chemotherapy drugs.  Have diabetes.  High blood sugar (glucose) is associated with high fat (lipid) levels in the blood.  Diabetes can also damage tiny blood vessels that carry nutrients to the heart muscle.  Have abnormal heart rhythms.  Have thyroid problems.  Have low blood counts (anemia). What are the signs or symptoms? Symptoms of this condition include:  Shortness of breath with activity, such as when climbing stairs.  Persistent cough.  Swelling of the feet, ankles, legs, or abdomen.  Unexplained weight gain.  Difficulty breathing when lying flat (orthopnea).  Waking from sleep because of the need to sit up and get more air.  Rapid heartbeat.  Fatigue and loss of energy.  Feeling light-headed, dizzy, or close to fainting.  Loss of appetite.  Nausea.  Increased urination during the night (nocturia).  Confusion. How is this diagnosed? This condition is diagnosed based on:  Medical history, symptoms, and a physical exam.  Diagnostic tests, which may include:  Echocardiogram.  Electrocardiogram (ECG).  Chest X-ray.  Blood tests.  Exercise stress test.  Radionuclide scans.  Cardiac catheterization and angiogram. How is this treated? Treatment for this condition is aimed at managing the symptoms of heart failure. Medicines, behavioral changes, or other treatments may be necessary to treat heart failure. Medicines  These may include:  Angiotensin-converting enzyme (ACE) inhibitors. This type of medicine blocks the effects of a blood protein called angiotensin-converting enzyme.  ACE inhibitors relax (dilate) the blood vessels and help to lower blood pressure.  Angiotensin receptor blockers (ARBs). This type of medicine  blocks the actions of a blood protein called angiotensin. ARBs dilate the blood vessels and help to lower blood pressure.  Water pills (diuretics). Diuretics cause the kidneys to remove salt and water from the blood. The extra fluid is removed through urination, leaving a lower volume of blood that the heart has to pump.  Beta blockers. These improve heart muscle strength and they prevent the heart from beating too quickly.  Digoxin. This increases the force of the heartbeat. Healthy behavior changes  These may include:  Reaching and maintaining a healthy weight.  Stopping smoking or chewing tobacco.  Eating heart-healthy foods.  Limiting or avoiding alcohol.  Stopping use of street drugs (illegal drugs).  Physical activity. Other treatments  These may include:  Surgery to open blocked coronary arteries or repair damaged heart valves.  Placement of a biventricular pacemaker to improve heart muscle function (cardiac resynchronization therapy). This device paces both the right ventricle and left ventricle.  Placement of a device to treat serious abnormal heart rhythms (implantable cardioverter defibrillator, or ICD).  Placement of a device to improve the pumping ability of the heart (left ventricular assist device, or LVAD).  Heart transplant. This can cure heart failure, and it is considered for certain patients who do not improve with other therapies. Follow these instructions at home: Medicines   Take over-the-counter and prescription medicines only as told by your health care provider. Medicines are important in reducing the workload of your heart, slowing the progression of heart failure, and improving your symptoms.  Do not stop taking your medicine unless your health care provider told you to do that.  Do not skip any dose of medicine.  Refill your prescriptions before you run out of medicine. You need your medicines every day. Eating and drinking    Eat  heart-healthy foods. Talk with a dietitian to make an eating plan that is right for you.  Choose foods that contain no trans fat and are low in saturated fat and cholesterol. Healthy choices include fresh or frozen fruits and vegetables, fish, lean meats, legumes, fat-free or low-fat dairy products, and whole-grain or high-fiber foods.  Limit salt (sodium) if directed by your health care provider. Sodium restriction may reduce symptoms of heart failure. Ask a dietitian to recommend heart-healthy seasonings.  Use healthy cooking methods instead of frying. Healthy methods include roasting, grilling, broiling, baking, poaching, steaming, and stir-frying.  Limit your fluid intake if directed by your health care provider. Fluid restriction may reduce symptoms of heart failure. Lifestyle   Stop smoking or using chewing tobacco. Nicotine and tobacco can damage your heart and your blood vessels. Do not use nicotine gum or patches before talking to your health care provider.  Limit alcohol intake to no more than 1 drink per day for non-pregnant women and 2 drinks per day for men. One drink equals 12 oz of beer, 5 oz of wine, or 1 oz of hard liquor.  Drinking more than that is harmful to your heart. Tell your health care provider if you drink alcohol several times a week.  Talk with your health care provider about whether any level of alcohol use is safe for you.  If your heart has already been damaged by alcohol or you have severe heart failure, drinking alcohol should be stopped completely.  Stop use of illegal drugs.  Lose weight if directed  by your health care provider. Weight loss may reduce symptoms of heart failure.  Do moderate physical activity if directed by your health care provider. People who are elderly and people with severe heart failure should consult with a health care provider for physical activity recommendations. Monitor important information   Weigh yourself every day.  Keeping track of your weight daily helps you to notice excess fluid sooner.  Weigh yourself every morning after you urinate and before you eat breakfast.  Wear the same amount of clothing each time you weigh yourself.  Record your daily weight. Provide your health care provider with your weight record.  Monitor and record your blood pressure as told by your health care provider.  Check your pulse as told by your health care provider. Dealing with extreme temperatures   If the weather is extremely hot:  Avoid vigorous physical activity.  Use air conditioning or fans or seek a cooler location.  Avoid caffeine and alcohol.  Wear loose-fitting, lightweight, and light-colored clothing.  If the weather is extremely cold:  Avoid vigorous physical activity.  Layer your clothes.  Wear mittens or gloves, a hat, and a scarf when you go outside.  Avoid alcohol. General instructions   Manage other health conditions such as hypertension, diabetes, thyroid disease, or abnormal heart rhythms as told by your health care provider.  Learn to manage stress. If you need help to do this, ask your health care provider.  Plan rest periods when fatigued.  Get ongoing education and support as needed.  Participate in or seek rehabilitation as needed to maintain or improve independence and quality of life.  Stay up to date with immunizations. Keeping current on pneumococcal and influenza immunizations is especially important to prevent respiratory infections.  Keep all follow-up visits as told by your health care provider. This is important. Contact a health care provider if:  You have a rapid weight gain.  You have increasing shortness of breath that is unusual for you.  You are unable to participate in your usual physical activities.  You tire easily.  You cough more than normal, especially with physical activity.  You have any swelling or more swelling in areas such as your hands,  feet, ankles, or abdomen.  You are unable to sleep because it is hard to breathe.  You feel like your heart is beating quickly (palpitations).  You become dizzy or light-headed when you stand up. Get help right away if:  You have difficulty breathing.  You notice or your family notices a change in your awareness, such as having trouble staying awake or having difficulty with concentration.  You have pain or discomfort in your chest.  You have an episode of fainting (syncope). This information is not intended to replace advice given to you by your health care provider. Make sure you discuss any questions you have with your health care provider. Document Released: 11/26/2005 Document Revised: 07/31/2016 Document Reviewed: 06/20/2016 Elsevier Interactive Patient Education  2017 [employer]. Palpitations A palpitation is the feeling that your heartbeat is irregular or is faster than normal. It may feel like your heart is fluttering or skipping a beat. Palpitations are usually not a serious problem. They may be caused by many things, including smoking, caffeine, alcohol, stress, and certain medicines. Although most causes of palpitations are not serious, palpitations can be a sign of a serious medical problem. In some cases, you may need further medical evaluation. Follow these instructions at home: Pay attention to any changes in  your symptoms. Take these actions to help with your condition:  Avoid the following:  Caffeinated coffee, tea, soft drinks, diet pills, and energy drinks.  Chocolate.  Alcohol.  Do not use any tobacco products, such as cigarettes, chewing tobacco, and e-cigarettes. If you need help quitting, ask your health care provider.  Try to reduce your stress and anxiety. Things that can help you relax include:  Yoga.  Meditation.  Physical activity, such as swimming, jogging, or walking.  Biofeedback. This is a method that helps you learn to use your mind to  control things in your body, such as your heartbeats.  Get plenty of rest and sleep.  Take over-the-counter and prescription medicines only as told by your health care provider.  Keep all follow-up visits as told by your health care provider. This is important. Contact a health care provider if:  You continue to have a fast or irregular heartbeat after 24 hours.  Your palpitations occur more often. Get help right away if:  You have chest pain or shortness of breath.  You have a severe headache.  You feel dizzy or you faint. This information is not intended to replace advice given to you by your health care provider. Make sure you discuss any questions you have with your health care provider. Document Released: 11/23/2000 Document Revised: 04/30/2016 Document Reviewed: 08/11/2015 Elsevier Interactive Patient Education  2017 [employer].

## 2017-04-05 ENCOUNTER — Ambulatory Visit: Payer: Self-pay | Admitting: *Deleted

## 2017-04-05 VITALS — BP 158/96 | HR 52

## 2017-04-05 DIAGNOSIS — R600 Localized edema: Secondary | ICD-10-CM

## 2017-04-05 DIAGNOSIS — I10 Essential (primary) hypertension: Secondary | ICD-10-CM

## 2017-04-05 LAB — COMPREHENSIVE METABOLIC PANEL
ALT: 25 IU/L (ref 0–44)
AST: 25 IU/L (ref 0–40)
Albumin/Globulin Ratio: 2 (ref 1.2–2.2)
Albumin: 4.5 g/dL (ref 3.6–4.8)
Alkaline Phosphatase: 76 IU/L (ref 39–117)
BUN/Creatinine Ratio: 15 (ref 10–24)
BUN: 17 mg/dL (ref 8–27)
Bilirubin Total: 0.4 mg/dL (ref 0.0–1.2)
CO2: 22 mmol/L (ref 18–29)
Calcium: 9.2 mg/dL (ref 8.6–10.2)
Chloride: 100 mmol/L (ref 96–106)
Creatinine, Ser: 1.12 mg/dL (ref 0.76–1.27)
GFR calc Af Amer: 82 mL/min/{1.73_m2} (ref >59)
GFR calc non Af Amer: 71 mL/min/{1.73_m2} (ref >59)
Globulin, Total: 2.2 g/dL (ref 1.5–4.5)
Glucose: 100 mg/dL — ABNORMAL HIGH (ref 65–99)
Potassium: 3.7 mmol/L (ref 3.5–5.2)
Sodium: 140 mmol/L (ref 134–144)
Total Protein: 6.7 g/dL (ref 6.0–8.5)

## 2017-04-05 LAB — MAGNESIUM: Magnesium: 2.1 mg/dL (ref 1.6–2.3)

## 2017-04-05 NOTE — Progress Notes (Signed)
Pt seen in clinic yesterday for leg swelling and ShOB. Today sx are improved some. ShOB improved, not quite returned to baseline yet. LE edema +2, improved from +4 yesterday. Pt took an extra dose of Lasix last night. Encouraged to take an extra dose as discussed with NP until swelling has returned to baseline of trace-+1. Pt agreeable to this. Has not called cardiology to discuss sx as they started improving.

## 2017-04-05 NOTE — Progress Notes (Signed)
Spoke with pt this am. Sx improved. Pulse regular, 57. BP 158/96. Did not weigh. Has not called Cardiology. Plans to.

## 2017-04-11 ENCOUNTER — Ambulatory Visit: Payer: Self-pay | Admitting: Registered Nurse

## 2017-04-11 VITALS — BP 159/104 | HR 60 | Temp 97.6°F

## 2017-04-11 DIAGNOSIS — J019 Acute sinusitis, unspecified: Secondary | ICD-10-CM

## 2017-04-11 DIAGNOSIS — I10 Essential (primary) hypertension: Secondary | ICD-10-CM

## 2017-04-11 DIAGNOSIS — H6593 Unspecified nonsuppurative otitis media, bilateral: Secondary | ICD-10-CM

## 2017-04-11 DIAGNOSIS — M7989 Other specified soft tissue disorders: Secondary | ICD-10-CM

## 2017-04-11 MED ORDER — FLUTICASONE PROPIONATE 50 MCG/ACT NA SUSP: 1.0000 | Freq: Two times a day (BID) | NASAL | 0 refills | Status: DC

## 2017-04-11 MED ORDER — SALINE SPRAY 0.65 % NA SOLN: 2.0000 | NASAL | 0 refills | Status: DC

## 2017-04-11 MED ORDER — MONTELUKAST SODIUM 10 MG PO TABS: 10.0000 mg | ORAL_TABLET | Freq: Every day | ORAL | 3 refills | Status: DC

## 2017-04-11 NOTE — Patient Instructions (Signed)
Hypertension Hypertension, commonly called high blood pressure, is when the force of blood pumping through the arteries is too strong. The arteries are the blood vessels that carry blood from the heart throughout the body. Hypertension forces the heart to work harder to pump blood and may cause arteries to become narrow or stiff. Having untreated or uncontrolled hypertension can cause heart attacks, strokes, kidney disease, and other problems. A blood pressure reading consists of a higher number over a lower number. Ideally, your blood pressure should be below 120/80. The first ("top") number is called the systolic pressure. It is a measure of the pressure in your arteries as your heart beats. The second ("bottom") number is called the diastolic pressure. It is a measure of the pressure in your arteries as the heart relaxes. What are the causes? The cause of this condition is not known. What increases the risk? Some risk factors for high blood pressure are under your control. Others are not. Factors you can change   Smoking.  Having type 2 diabetes mellitus, high cholesterol, or both.  Not getting enough exercise or physical activity.  Being overweight.  Having too much fat, sugar, calories, or salt (sodium) in your diet.  Drinking too much alcohol. Factors that are difficult or impossible to change   Having chronic kidney disease.  Having a family history of high blood pressure.  Age. Risk increases with age.  Race. You may be at higher risk if you are African-American.  Gender. Men are at higher risk than women before age 45. After age 65, women are at higher risk than men.  Having obstructive sleep apnea.  Stress. What are the signs or symptoms? Extremely high blood pressure (hypertensive crisis) may cause:  Headache.  Anxiety.  Shortness of breath.  Nosebleed.  Nausea and vomiting.  Severe chest pain.  Jerky movements you cannot control (seizures). How is this  diagnosed? This condition is diagnosed by measuring your blood pressure while you are seated, with your arm resting on a surface. The cuff of the blood pressure monitor will be placed directly against the skin of your upper arm at the level of your heart. It should be measured at least twice using the same arm. Certain conditions can cause a difference in blood pressure between your right and left arms. Certain factors can cause blood pressure readings to be lower or higher than normal (elevated) for a short period of time:  When your blood pressure is higher when you are in a health care provider's office than when you are at home, this is called white coat hypertension. Most people with this condition do not need medicines.  When your blood pressure is higher at home than when you are in a health care provider's office, this is called masked hypertension. Most people with this condition may need medicines to control blood pressure. If you have a high blood pressure reading during one visit or you have normal blood pressure with other risk factors:  You may be asked to return on a different day to have your blood pressure checked again.  You may be asked to monitor your blood pressure at home for 1 week or longer. If you are diagnosed with hypertension, you may have other blood or imaging tests to help your health care provider understand your overall risk for other conditions. How is this treated? This condition is treated by making healthy lifestyle changes, such as eating healthy foods, exercising more, and reducing your alcohol intake. Your health   care provider may prescribe medicine if lifestyle changes are not enough to get your blood pressure under control, and if:  Your systolic blood pressure is above 130.  Your diastolic blood pressure is above 80. Your personal target blood pressure may vary depending on your medical conditions, your age, and other factors. Follow these instructions  at home: Eating and drinking   Eat a diet that is high in fiber and potassium, and low in sodium, added sugar, and fat. An example eating plan is called the DASH (Dietary Approaches to Stop Hypertension) diet. To eat this way:  Eat plenty of fresh fruits and vegetables. Try to fill half of your plate at each meal with fruits and vegetables.  Eat whole grains, such as whole wheat pasta, brown rice, or whole grain bread. Fill about one quarter of your plate with whole grains.  Eat or drink low-fat dairy products, such as skim milk or low-fat yogurt.  Avoid fatty cuts of meat, processed or cured meats, and poultry with skin. Fill about one quarter of your plate with lean proteins, such as fish, chicken without skin, beans, eggs, and tofu.  Avoid premade and processed foods. These tend to be higher in sodium, added sugar, and fat.  Reduce your daily sodium intake. Most people with hypertension should eat less than 1,500 mg of sodium a day.  Limit alcohol intake to no more than 1 drink a day for nonpregnant women and 2 drinks a day for men. One drink equals 12 oz of beer, 5 oz of wine, or 1 oz of hard liquor. Lifestyle   Work with your health care provider to maintain a healthy body weight or to lose weight. Ask what an ideal weight is for you.  Get at least 30 minutes of exercise that causes your heart to beat faster (aerobic exercise) most days of the week. Activities may include walking, swimming, or biking.  Include exercise to strengthen your muscles (resistance exercise), such as pilates or lifting weights, as part of your weekly exercise routine. Try to do these types of exercises for 30 minutes at least 3 days a week.  Do not use any products that contain nicotine or tobacco, such as cigarettes and e-cigarettes. If you need help quitting, ask your health care provider.  Monitor your blood pressure at home as told by your health care provider.  Keep all follow-up visits as told by  your health care provider. This is important. Medicines   Take over-the-counter and prescription medicines only as told by your health care provider. Follow directions carefully. Blood pressure medicines must be taken as prescribed.  Do not skip doses of blood pressure medicine. Doing this puts you at risk for problems and can make the medicine less effective.  Ask your health care provider about side effects or reactions to medicines that you should watch for. Contact a health care provider if:  You think you are having a reaction to a medicine you are taking.  You have headaches that keep coming back (recurring).  You feel dizzy.  You have swelling in your ankles.  You have trouble with your vision. Get help right away if:  You develop a severe headache or confusion.  You have unusual weakness or numbness.  You feel faint.  You have severe pain in your chest or abdomen.  You vomit repeatedly.  You have trouble breathing. Summary  Hypertension is when the force of blood pumping through your arteries is too strong. If this condition is   not controlled, it may put you at risk for serious complications.  Your personal target blood pressure may vary depending on your medical conditions, your age, and other factors. For most people, a normal blood pressure is less than 120/80.  Hypertension is treated with lifestyle changes, medicines, or a combination of both. Lifestyle changes include weight loss, eating a healthy, low-sodium diet, exercising more, and limiting alcohol. This information is not intended to replace advice given to you by your health care provider. Make sure you discuss any questions you have with your health care provider. Document Released: 11/26/2005 Document Revised: 10/24/2016 Document Reviewed: 10/24/2016 Elsevier Interactive Patient Education  2017 [employer] Nasal Allergies Nasal allergies are a reaction to allergens in the air. Allergens are  particles in the air that cause your body to have an allergic reaction. Nasal allergies are not passed from person to person (are not contagious). They cannot be cured, but they can be controlled. What are the causes? Seasonal nasal allergies (hay fever) are caused by pollen allergens that come from grasses, trees, and weeds. Year-round nasal allergies (perennial allergic rhinitis) are caused by allergens such as house dust mites, pet dander, and mold spores. What increases the risk? The following factors may make you more likely to develop this condition:  Having certain health conditions. These include:  Other types of allergies, such as food allergies.  Asthma.  Eczema.  Having a close relative who has allergies or asthma.  Exposure to house dust, pollen, dander, or other allergens at home or at work.  Exposure to air pollution or secondhand smoke when you were a child. What are the signs or symptoms? Symptoms of this condition include:  Sneezing.  Runny nose or stuffy nose (congestion).  Watery (tearing) eyes.  Itchy eyes, nose, mouth, throat, skin, or other area.  Sore throat.  Headache.  Decreased sense of smell or taste.  Fatigue. This may occur if you have trouble sleeping due to allergies.  Swollen eyelids. How is this diagnosed? This condition is diagnosed with a medical history and physical exam. Allergy testing may be done to determine exactly what triggers your nasal allergies. How is this treated? There is no cure for nasal allergies. Treatment focuses on controlling your symptoms, and it may include:  Medicines that block allergy symptoms. These may include allergy shots, nasal sprays, and oral antihistamines.  Avoiding the allergen. Follow these instructions at home:  Avoid the allergen that is causing your symptoms, if possible.  Keep windows closed. If possible, use air conditioning when pollen counts are high.  Do not use fans in your  home.  Do not hang clothes outside to dry.  Wear sunglasses to keep pollen out of your eyes.  Wash your hands right away after you touch household pets.  Take over-the-counter and prescription medicines only as told by your health care provider.  Keep all follow-up visits as told by your health care provider. This is important. Contact a health care provider if:  You have a fever.  You develop a cough that does not go away (is persistent).  You start to wheeze.  Your symptoms do not improve with treatment.  You have thick nasal discharge.  You start to have nosebleeds. Get help right away if:  Your tongue or your lips are swollen.  You have trouble breathing.  You feel light-headed or you feel like you are going to faint.  You have cold sweats. This information is not intended to replace advice given to  you by your health care provider. Make sure you discuss any questions you have with your health care provider. Document Released: 11/26/2005 Document Revised: 04/30/2016 Document Reviewed: 06/08/2015 Else[employer] Interactive Patient Education  2017 [employer] Sinusitis, Adult Sinusitis is soreness and inflammation of your sinuses. Sinuses are hollow spaces in the bones around your face. Your sinuses are located:  Around your eyes.  In the middle of your forehead.  Behind your nose.  In your cheekbones. Your sinuses and nasal passages are lined with a stringy fluid (mucus). Mucus normally drains out of your sinuses. When your nasal tissues become inflamed or swollen, the mucus can become trapped or blocked so air cannot flow through your sinuses. This allows bacteria, viruses, and funguses to grow, which leads to infection. Sinusitis can develop quickly and last for 7?10 days (acute) or for more than 12 weeks (chronic). Sinusitis often develops after a cold. What are the causes? This condition is caused by anything that creates swelling in the sinuses or stops mucus  from draining, including:  Allergies.  Asthma.  Bacterial or viral infection.  Abnormally shaped bones between the nasal passages.  Nasal growths that contain mucus (nasal polyps).  Narrow sinus openings.  Pollutants, such as chemicals or irritants in the air.  A foreign object stuck in the nose.  A fungal infection. This is rare. What increases the risk? The following factors may make you more likely to develop this condition:  Having allergies or asthma.  Having had a recent cold or respiratory tract infection.  Having structural deformities or blockages in your nose or sinuses.  Having a weak immune system.  Doing a lot of swimming or diving.  Overusing nasal sprays.  Smoking. What are the signs or symptoms? The main symptoms of this condition are pain and a feeling of pressure around the affected sinuses. Other symptoms include:  Upper toothache.  Earache.  Headache.  Bad breath.  Decreased sense of smell and taste.  A cough that may get worse at night.  Fatigue.  Fever.  Thick drainage from your nose. The drainage is often green and it may contain pus (purulent).  Stuffy nose or congestion.  Postnasal drip. This is when extra mucus collects in the throat or back of the nose.  Swelling and warmth over the affected sinuses.  Sore throat.  Sensitivity to light. How is this diagnosed? This condition is diagnosed based on symptoms, a medical history, and a physical exam. To find out if your condition is acute or chronic, your health care provider may:  Look in your nose for signs of nasal polyps.  Tap over the affected sinus to check for signs of infection.  View the inside of your sinuses using an imaging device that has a light attached (endoscope). If your health care provider suspects that you have chronic sinusitis, you may also:  Be tested for allergies.  Have a sample of mucus taken from your nose (nasal culture) and checked for  bacteria.  Have a mucus sample examined to see if your sinusitis is related to an allergy. If your sinusitis does not respond to treatment and it lasts longer than 8 weeks, you may have an MRI or CT scan to check your sinuses. These scans also help to determine how severe your infection is. In rare cases, a bone biopsy may be done to rule out more serious types of fungal sinus disease. How is this treated? Treatment for sinusitis depends on the cause and whether your condition  is chronic or acute. If a virus is causing your sinusitis, your symptoms will go away on their own within 10 days. You may be given medicines to relieve your symptoms, including:  Topical nasal decongestants. They shrink swollen nasal passages and let mucus drain from your sinuses.  Antihistamines. These drugs block inflammation that is triggered by allergies. This can help to ease swelling in your nose and sinuses.  Topical nasal corticosteroids. These are nasal sprays that ease inflammation and swelling in your nose and sinuses.  Nasal saline washes. These rinses can help to get rid of thick mucus in your nose. If your condition is caused by bacteria, you will be given an antibiotic medicine. If your condition is caused by a fungus, you will be given an antifungal medicine. Surgery may be needed to correct underlying conditions, such as narrow nasal passages. Surgery may also be needed to remove polyps. Follow these instructions at home: Medicines   Take, use, or apply over-the-counter and prescription medicines only as told by your health care provider. These may include nasal sprays.  If you were prescribed an antibiotic medicine, take it as told by your health care provider. Do not stop taking the antibiotic even if you start to feel better. Hydrate and Humidify   Drink enough water to keep your urine clear or pale yellow. Staying hydrated will help to thin your mucus.  Use a cool mist humidifier to keep the  humidity level in your home above 50%.  Inhale steam for 10-15 minutes, 3-4 times a day or as told by your health care provider. You can do this in the bathroom while a hot shower is running.  Limit your exposure to cool or dry air. Rest   Rest as much as possible.  Sleep with your head raised (elevated).  Make sure to get enough sleep each night. General instructions   Apply a warm, moist washcloth to your face 3-4 times a day or as told by your health care provider. This will help with discomfort.  Wash your hands often with soap and water to reduce your exposure to viruses and other germs. If soap and water are not available, use hand sanitizer.  Do not smoke. Avoid being around people who are smoking (secondhand smoke).  Keep all follow-up visits as told by your health care provider. This is important. Contact a health care provider if:  You have a fever.  Your symptoms get worse.  Your symptoms do not improve within 10 days. Get help right away if:  You have a severe headache.  You have persistent vomiting.  You have pain or swelling around your face or eyes.  You have vision problems.  You develop confusion.  Your neck is stiff.  You have trouble breathing. This information is not intended to replace advice given to you by your health care provider. Make sure you discuss any questions you have with your health care provider. Document Released: 11/26/2005 Document Revised: 07/22/2016 Document Reviewed: 09/21/2015 [employer] Interactive Patient Education  2017 [employer].

## 2017-04-11 NOTE — Progress Notes (Signed)
Pt out of Amlodipine. Has not taken in 5 days. BLE edema +2. ShOB remains the same. More than 1 flight of stairs is difficult for him. Believes some of the ShOB is r/t allergies. Reports some L sided sinus facial pain. Denies nasal congestion/drainage.   Subjective:    Patient ID: Ryan Glass, male    DOB: 02-Dec-1956, 61 y.o.   MRN: [id number]  61y/o married hispanic male ran out of Amlodipine. Has not taken in 5 days. BLE edema +2. ShOB remains the same. More than 1 flight of stairs is difficult for him. Believes some of the SOB is r/t allergies. Reports some L sided sinus facial pain. Denies nasal congestion/drainage.       Review of Systems  Constitutional: Negative for activity change, appetite change, chills, diaphoresis, fatigue, fever and unexpected weight change.  HENT: Positive for congestion, postnasal drip, rhinorrhea, sinus pain and sinus pressure. Negative for dental problem, drooling, ear discharge, ear pain, facial swelling, hearing loss, mouth sores, nosebleeds, sneezing, sore throat, tinnitus, trouble swallowing and voice change.   Eyes: Negative for photophobia, pain, discharge, redness, itching and visual disturbance.  Respiratory: Positive for cough. Negative for choking, chest tightness, shortness of breath, wheezing and stridor.   Cardiovascular: Positive for leg swelling. Negative for chest pain and palpitations.  Gastrointestinal: Negative for abdominal distention, abdominal pain, blood in stool, constipation, diarrhea, nausea and vomiting.  Endocrine: Negative for cold intolerance and heat intolerance.  Genitourinary: Negative for dysuria.  Musculoskeletal: Negative for arthralgias, back pain, gait problem, joint swelling, myalgias, neck pain and neck stiffness.  Skin: Negative for color change, pallor, rash and wound.  Allergic/Immunologic: Positive for environmental allergies. Negative for food allergies and immunocompromised state.  Neurological: Negative for  dizziness, tremors, seizures, syncope, facial asymmetry, speech difficulty, weakness, light-headedness, numbness and headaches.  Hematological: Negative for adenopathy. Does not bruise/bleed easily.  Psychiatric/Behavioral: Negative for agitation, behavioral problems, confusion and sleep disturbance.       Objective:   Physical Exam  Constitutional: He is oriented to person, place, and time. Vital signs are normal. He appears well-developed and well-nourished. He is active and cooperative.  Non-toxic appearance. He does not have a sickly appearance. He appears ill. No distress.  HENT:  Head: Normocephalic and atraumatic.  Right Ear: Hearing, external ear and ear canal normal. A middle ear effusion is present.  Left Ear: Hearing, external ear and ear canal normal. A middle ear effusion is present.  Nose: Mucosal edema and rhinorrhea present. No nose lacerations, sinus tenderness, nasal deformity, septal deviation or nasal septal hematoma. No epistaxis.  No foreign bodies. Right sinus exhibits frontal sinus tenderness. Right sinus exhibits no maxillary sinus tenderness. Left sinus exhibits frontal sinus tenderness. Left sinus exhibits no maxillary sinus tenderness.  Mouth/Throat: Uvula is midline and mucous membranes are normal. Mucous membranes are not pale, not dry and not cyanotic. He does not have dentures. No oral lesions. No trismus in the jaw. Normal dentition. No dental abscesses, uvula swelling, lacerations or dental caries. Posterior oropharyngeal edema and posterior oropharyngeal erythema present. No oropharyngeal exudate or tonsillar abscesses.  Mild frontal tenderness with palpation bilaterally; bilateral allergic shiners; cobblestoning posterior pharynx; bilateral TMs air fluid level clear; bilateral nasal turbinates clear discharge edema erythema  Eyes: EOM and lids are normal. Pupils are equal, round, and reactive to light. Right eye exhibits no chemosis, no discharge, no exudate and no  hordeolum. No foreign body present in the right eye. Left eye exhibits no chemosis, no discharge, no  exudate and no hordeolum. No foreign body present in the left eye. Right conjunctiva is injected. Right conjunctiva has no hemorrhage. Left conjunctiva is injected. Left conjunctiva has no hemorrhage. No scleral icterus. Right eye exhibits normal extraocular motion and no nystagmus. Left eye exhibits normal extraocular motion and no nystagmus. Right pupil is round and reactive. Left pupil is round and reactive. Pupils are equal.  Bilaterally 1-2+/4 bulbar conjunctiva injection  Neck: Trachea normal and normal range of motion. Neck supple. No tracheal tenderness, no spinous process tenderness and no muscular tenderness present. No neck rigidity. No tracheal deviation, no edema, no erythema and normal range of motion present. No thyroid mass and no thyromegaly present.  Cardiovascular: Normal rate, regular rhythm, S1 normal, S2 normal, normal heart sounds and intact distal pulses.  PMI is not displaced.  Exam reveals no gallop and no friction rub.   No murmur heard. Pulses:      Radial pulses are 2+ on the right side, and 2+ on the left side.  Pulmonary/Chest: Effort normal. No stridor. No respiratory distress. He has no decreased breath sounds. He has wheezes in the right upper field and the left upper field. He has no rhonchi. He has no rales.  Faint inspiratory bilateral upper fields wheeze  Abdominal: Soft. He exhibits no distension.  Musculoskeletal: Normal range of motion. He exhibits no tenderness.       Right ankle: He exhibits swelling. He exhibits no ecchymosis and normal pulse. Achilles tendon normal.       Left ankle: He exhibits swelling. He exhibits no ecchymosis. Achilles tendon normal.       Right lower leg: He exhibits edema. He exhibits no tenderness.       Left lower leg: He exhibits edema. He exhibits no tenderness.  1+ bilaterally lower extremity pitting edema  Lymphadenopathy:        Head (right side): No submental, no submandibular, no tonsillar, no preauricular, no posterior auricular and no occipital adenopathy present.       Head (left side): No submental, no submandibular, no tonsillar, no preauricular, no posterior auricular and no occipital adenopathy present.    He has no cervical adenopathy.       Right cervical: No superficial cervical, no deep cervical and no posterior cervical adenopathy present.      Left cervical: No superficial cervical, no deep cervical and no posterior cervical adenopathy present.  Neurological: He is alert and oriented to person, place, and time. He has normal strength. He is not disoriented. He displays no atrophy and no tremor. No cranial nerve deficit or sensory deficit. He exhibits normal muscle tone. He displays no seizure activity. Coordination and gait normal. GCS eye subscore is 4. GCS verbal subscore is 5. GCS motor subscore is 6.  Skin: Skin is warm, dry and intact. No abrasion, no bruising, no burn, no ecchymosis, no laceration, no lesion, no petechiae and no rash noted. He is not diaphoretic. No cyanosis or erythema. No pallor. Nails show no clubbing.  Psychiatric: He has a normal mood and affect. His speech is normal and behavior is normal. Judgment and thought content normal. Cognition and memory are normal.  Nursing note and vitals reviewed.         Assessment & Plan:  A-hypertension, acute rhinosinusitis, bilateral otitis media effusion  P-Patient may use normal saline nasal spray as needed.  Consider antihistamine (allegra 180mg  po daily at home) and flonase 1 spray each nostril bid at home  Rx singulair 10mg  po  qhs #30 Rf3 given today pick up at [employer].  Avoid triggers if possible.  Shower prior to bedtime if exposed to triggers.  If allergic dust/dust mites recommend mattress/pillow covers/encasements; washing linens, vacuuming, sweeping, dusting weekly.  Call or return to clinic as needed if these symptoms  worsen or fail to improve as anticipated.   Exitcare handout on allergic rhinitis given to patient.  Patient verbalized understanding of instructions, agreed with plan of care and had no further questions at this time.  P2:  Avoidance and hand washing.  Supportive treatment.   No evidence of invasive bacterial infection, non toxic and well hydrated.  This is most likely self limiting viral infection.  I do not see where any further testing or imaging is necessary at this time.   I will suggest supportive care, rest, good hygiene and encourage the patient to take adequate fluids.  The patient is to return to clinic or EMERGENCY ROOM if symptoms worsen or change significantly e.g. ear pain, fever, purulent discharge from ears or bleeding.  Exitcare handout on otitis media with effusion given to patient.  Patient verbalized agreement and understanding of treatment plan.    Restart flonase 1 spray each nostril BID, saline 2 sprays each nostril q2h prn congestion.  If no improvement with 48 hours of saline and flonase and singulair will consider antibiotics.  Rx given.  No evidence of systemic bacterial infection, non toxic and well hydrated.  I do not see where any further testing or imaging is necessary at this time.   I will suggest supportive care, rest, good hygiene and encourage the patient to take adequate fluids.  The patient is to return to clinic or EMERGENCY ROOM if symptoms worsen or change significantly.  Exitcare handout on sinusitis given to patient.  Patient verbalized agreement and understanding of treatment plan and had no further questions at this time.   P2:  Hand washing and cover cough  Refilled amlodipine 10mg  po daily #90 RF0 from [facility].  Patient to follow up with Dr [facility] (cardiology) due to bilateral leg swelling improved from last week.  Consider ted hose.  Continue current medications as directed.  Continue to monitor blood pressure at home and maintain log of blood pressure and pulse to  bring to follow up appointments.  Continue low sodium diet and exercise program.  Recommended weight loss/weight maintenance to BMI 20-25.  Return to the clinic if any new symptoms.  Patient verbalized agreement and understanding of treatment plan and had no further questions at this time.   P2:  Diet and Exercise specific for HTN

## 2017-04-16 ENCOUNTER — Ambulatory Visit: Payer: Self-pay | Admitting: Registered Nurse

## 2017-04-16 VITALS — HR 56 | Temp 98.2°F | Resp 18

## 2017-04-16 DIAGNOSIS — H6593 Unspecified nonsuppurative otitis media, bilateral: Secondary | ICD-10-CM

## 2017-04-16 DIAGNOSIS — J019 Acute sinusitis, unspecified: Secondary | ICD-10-CM

## 2017-04-16 NOTE — Progress Notes (Signed)
Subjective:    Patient ID: Ryan Glass, male    DOB: [birth year], 61 y.o.   MRN: [id number]  61y/o married hispanic male here for re-evaluation wheezing and leg swelling.  Patient reported he started singulair 04/11/2017 but ran out of flonase and has not been using nasal saline.  Legs a little more swollen today and he reported intermittent wheezing still especially when going outside.  Has been able to increase activity without getting short of breath e.g. Walking  Has some frontal sinus pressure and nasal congestion along with post nasal drip. Denied fever, chills, rash, nausea, vomiting, diarrhea, dysuria, ear discharge, eye discharge.      Review of Systems  Constitutional: Negative for activity change, appetite change, chills, diaphoresis, fatigue, fever and unexpected weight change.  HENT: Positive for congestion, postnasal drip, sinus pressure and sore throat. Negative for dental problem, drooling, ear discharge, ear pain, facial swelling, hearing loss, mouth sores, nosebleeds, rhinorrhea, sinus pain, sneezing, tinnitus, trouble swallowing and voice change.   Eyes: Negative for photophobia, pain, discharge, redness, itching and visual disturbance.  Respiratory: Positive for wheezing. Negative for cough, choking, chest tightness, shortness of breath and stridor.   Cardiovascular: Positive for leg swelling. Negative for chest pain and palpitations.  Gastrointestinal: Negative for abdominal distention, abdominal pain, blood in stool, constipation, diarrhea, nausea and vomiting.  Endocrine: Negative for cold intolerance and heat intolerance.  Genitourinary: Negative for dysuria.  Musculoskeletal: Negative for arthralgias, back pain, gait problem, joint swelling, myalgias, neck pain and neck stiffness.  Skin: Negative for color change, pallor, rash and wound.  Allergic/Immunologic: Positive for environmental allergies. Negative for food allergies and immunocompromised state.   Neurological: Negative for dizziness, tremors, seizures, syncope, facial asymmetry, speech difficulty, weakness, light-headedness, numbness and headaches.  Hematological: Negative for adenopathy. Does not bruise/bleed easily.  Psychiatric/Behavioral: Negative for agitation, behavioral problems, confusion and sleep disturbance.       Objective:   Physical Exam  Constitutional: He is oriented to person, place, and time. Vital signs are normal. He appears well-developed and well-nourished. He is active and cooperative.  Non-toxic appearance. He does not have a sickly appearance. He does not appear ill. No distress.  HENT:  Head: Normocephalic and atraumatic.  Right Ear: Hearing, external ear and ear canal normal. A middle ear effusion is present.  Left Ear: Hearing, external ear and ear canal normal. A middle ear effusion is present.  Nose: Mucosal edema and rhinorrhea present. No nose lacerations, sinus tenderness, nasal deformity, septal deviation or nasal septal hematoma. No epistaxis.  No foreign bodies. Right sinus exhibits frontal sinus tenderness. Right sinus exhibits no maxillary sinus tenderness. Left sinus exhibits frontal sinus tenderness. Left sinus exhibits no maxillary sinus tenderness.  Mouth/Throat: Uvula is midline and mucous membranes are normal. Mucous membranes are not pale, not dry and not cyanotic. He does not have dentures. No oral lesions. No trismus in the jaw. Normal dentition. No dental abscesses, uvula swelling, lacerations or dental caries. Posterior oropharyngeal edema and posterior oropharyngeal erythema present. No oropharyngeal exudate or tonsillar abscesses.  Bilateral mild frontal tenderness to palpation sinus; clear bilateral discharge nasal turbinates edema/erythema; bilateral allergic shiners lower eyelid swelling; bilateral TMs air fluid level clear; cobblestoning posterior pharynx  Eyes: Conjunctivae, EOM and lids are normal. Pupils are equal, round, and  reactive to light. Right eye exhibits no chemosis, no discharge, no exudate and no hordeolum. No foreign body present in the right eye. Left eye exhibits no chemosis, no discharge, no exudate and no  hordeolum. No foreign body present in the left eye. Right conjunctiva is not injected. Right conjunctiva has no hemorrhage. Left conjunctiva is not injected. Left conjunctiva has no hemorrhage. No scleral icterus. Right eye exhibits normal extraocular motion and no nystagmus. Left eye exhibits normal extraocular motion and no nystagmus. Right pupil is round and reactive. Left pupil is round and reactive. Pupils are equal.  Neck: Trachea normal and normal range of motion. Neck supple. No tracheal tenderness, no spinous process tenderness and no muscular tenderness present. No neck rigidity. No tracheal deviation, no edema, no erythema and normal range of motion present. No thyroid mass and no thyromegaly present.  Cardiovascular: Regular rhythm, S1 normal, S2 normal, normal heart sounds and intact distal pulses.  Bradycardia present.  PMI is not displaced.  Exam reveals no gallop and no friction rub.   No murmur heard. Pulses:      Radial pulses are 2+ on the right side, and 2+ on the left side.  Bilateral lower extremity pitting edema 1-2+/4; able to walk across warehouse to clinic at fast pace and conversationally speak with me  Pulmonary/Chest: Effort normal and breath sounds normal. No stridor. No respiratory distress. He has no decreased breath sounds. He has no wheezes. He has no rhonchi. He has no rales.  No wheezing on auscultation today; speaks full sentences without difficulty  Abdominal: Soft. He exhibits no distension.  Musculoskeletal: Normal range of motion. He exhibits no tenderness.       Right shoulder: Normal.       Left shoulder: Normal.       Right elbow: Normal.      Left elbow: Normal.       Right hip: Normal.       Left hip: Normal.       Right knee: Normal.       Left knee:  Normal.       Right ankle: He exhibits swelling. He exhibits normal range of motion, no ecchymosis, no deformity, no laceration and normal pulse. No tenderness. Achilles tendon normal.       Left ankle: He exhibits swelling. He exhibits normal range of motion, no ecchymosis, no deformity, no laceration and normal pulse. No tenderness. Achilles tendon normal.       Right hand: Normal.       Left hand: Normal.       Right lower leg: He exhibits edema. He exhibits no tenderness, no bony tenderness, no swelling and no deformity.       Left lower leg: He exhibits edema. He exhibits no tenderness, no bony tenderness, no swelling, no deformity and no laceration.  Gait sure and steady in warehouse and clinic; on/off exam table and in/out of chair  Lymphadenopathy:       Head (right side): No submental, no submandibular, no tonsillar, no preauricular, no posterior auricular and no occipital adenopathy present.       Head (left side): No submental, no submandibular, no tonsillar, no preauricular, no posterior auricular and no occipital adenopathy present.    He has no cervical adenopathy.       Right cervical: No superficial cervical, no deep cervical and no posterior cervical adenopathy present.      Left cervical: No superficial cervical, no deep cervical and no posterior cervical adenopathy present.  Neurological: He is alert and oriented to person, place, and time. He has normal strength. He displays no atrophy and no tremor. No cranial nerve deficit or sensory deficit. He exhibits normal muscle  tone. He displays no seizure activity. Coordination and gait normal. GCS eye subscore is 4. GCS verbal subscore is 5. GCS motor subscore is 6.  Bilateral hand grasp equal  Skin: Skin is warm, dry and intact. No abrasion, no bruising, no burn, no ecchymosis, no laceration, no lesion, no petechiae and no rash noted. He is not diaphoretic. No cyanosis or erythema. No pallor. Nails show no clubbing.  Psychiatric: He  has a normal mood and affect. His speech is normal and behavior is normal. Judgment and thought content normal. Cognition and memory are normal.  Nursing note and vitals reviewed.         Assessment & Plan:  A-acute rhinosinusitis and bilateral otitis media effusion  P-Continue singulair 10mg  po qhs and antihistamine OTC po daily at home.  Refilled fluticasone nasal spray 1 spray each nostril BID from [facility] and gave 1 bottle nasal saline to patient from clinic stock use BID in shower and prn 2 sprays each nostril q2h wa prn congestion.  Patient with subacute sinusitis will hold antibiotic at this time and 48 hours use flonase/saline re-evaluate in Thursday clinic if no improvement per patient.  Discussed with patient wheezing he has noticed could be allergy or cardiac related if worsening/palpitations/shortness of breath/chest pain/worst headache of his life seek immediate re-evaluation with cardiology and/or ER of choice.  SP02 and functional exam much improved this week compared to previous and breath sounds without adventitious sounds.  Holding oral steroids at this time due to chronically elevated blood pressure despite medications.  Avoid triggers if possible.  Shower prior to bedtime if exposed to triggers.  If allergic dust/dust mites recommend mattress/pillow covers/encasements; washing linens, vacuuming, sweeping, dusting weekly.  Call or return to clinic as needed if these symptoms worsen or fail to improve as anticipated.   Exitcare handout on allergic rhinitis given to patient.  Patient verbalized understanding of instructions, agreed with plan of care and had no further questions at this time.   Supportive treatment.   No evidence of invasive bacterial infection, non toxic and well hydrated.  This is most likely self limiting viral infection.  I do not see where any further testing or imaging is necessary at this time.   I will suggest supportive care, rest, good hygiene and encourage  the patient to take adequate fluids.  The patient is to return to clinic or EMERGENCY ROOM if symptoms worsen or change significantly e.g. ear pain, fever, purulent discharge from ears or bleeding.  Exitcare handout on otitis media with effusion given to patient.  Patient verbalized agreement and understanding of treatment plan.

## 2017-04-16 NOTE — Patient Instructions (Signed)
Shower twice a day especially before bedtime Use nasal saline in the shower and as needed for congestion each nostril flonase 1 spray each nostril twice a day 15 minutes after nasal saline Follow up re-evaluation if worsening headache, fever greater than 100.5, ear drainage, worsening leg swelling, shortness of breath and/or chest pain  Sinusitis, Adult Sinusitis is soreness and inflammation of your sinuses. Sinuses are hollow spaces in the bones around your face. Your sinuses are located:  Around your eyes.  In the middle of your forehead.  Behind your nose.  In your cheekbones. Your sinuses and nasal passages are lined with a stringy fluid (mucus). Mucus normally drains out of your sinuses. When your nasal tissues become inflamed or swollen, the mucus can become trapped or blocked so air cannot flow through your sinuses. This allows bacteria, viruses, and funguses to grow, which leads to infection. Sinusitis can develop quickly and last for 7?10 days (acute) or for more than 12 weeks (chronic). Sinusitis often develops after a cold. What are the causes? This condition is caused by anything that creates swelling in the sinuses or stops mucus from draining, including:  Allergies.  Asthma.  Bacterial or viral infection.  Abnormally shaped bones between the nasal passages.  Nasal growths that contain mucus (nasal polyps).  Narrow sinus openings.  Pollutants, such as chemicals or irritants in the air.  A foreign object stuck in the nose.  A fungal infection. This is rare. What increases the risk? The following factors may make you more likely to develop this condition:  Having allergies or asthma.  Having had a recent cold or respiratory tract infection.  Having structural deformities or blockages in your nose or sinuses.  Having a weak immune system.  Doing a lot of swimming or diving.  Overusing nasal sprays.  Smoking. What are the signs or symptoms? The main  symptoms of this condition are pain and a feeling of pressure around the affected sinuses. Other symptoms include:  Upper toothache.  Earache.  Headache.  Bad breath.  Decreased sense of smell and taste.  A cough that may get worse at night.  Fatigue.  Fever.  Thick drainage from your nose. The drainage is often green and it may contain pus (purulent).  Stuffy nose or congestion.  Postnasal drip. This is when extra mucus collects in the throat or back of the nose.  Swelling and warmth over the affected sinuses.  Sore throat.  Sensitivity to light. How is this diagnosed? This condition is diagnosed based on symptoms, a medical history, and a physical exam. To find out if your condition is acute or chronic, your health care provider may:  Look in your nose for signs of nasal polyps.  Tap over the affected sinus to check for signs of infection.  View the inside of your sinuses using an imaging device that has a light attached (endoscope). If your health care provider suspects that you have chronic sinusitis, you may also:  Be tested for allergies.  Have a sample of mucus taken from your nose (nasal culture) and checked for bacteria.  Have a mucus sample examined to see if your sinusitis is related to an allergy. If your sinusitis does not respond to treatment and it lasts longer than 8 weeks, you may have an MRI or CT scan to check your sinuses. These scans also help to determine how severe your infection is. In rare cases, a bone biopsy may be done to rule out more serious types of  fungal sinus disease. How is this treated? Treatment for sinusitis depends on the cause and whether your condition is chronic or acute. If a virus is causing your sinusitis, your symptoms will go away on their own within 10 days. You may be given medicines to relieve your symptoms, including:  Topical nasal decongestants. They shrink swollen nasal passages and let mucus drain from your  sinuses.  Antihistamines. These drugs block inflammation that is triggered by allergies. This can help to ease swelling in your nose and sinuses.  Topical nasal corticosteroids. These are nasal sprays that ease inflammation and swelling in your nose and sinuses.  Nasal saline washes. These rinses can help to get rid of thick mucus in your nose. If your condition is caused by bacteria, you will be given an antibiotic medicine. If your condition is caused by a fungus, you will be given an antifungal medicine. Surgery may be needed to correct underlying conditions, such as narrow nasal passages. Surgery may also be needed to remove polyps. Follow these instructions at home: Medicines   Take, use, or apply over-the-counter and prescription medicines only as told by your health care provider. These may include nasal sprays.  If you were prescribed an antibiotic medicine, take it as told by your health care provider. Do not stop taking the antibiotic even if you start to feel better. Hydrate and Humidify   Drink enough water to keep your urine clear or pale yellow. Staying hydrated will help to thin your mucus.  Use a cool mist humidifier to keep the humidity level in your home above 50%.  Inhale steam for 10-15 minutes, 3-4 times a day or as told by your health care provider. You can do this in the bathroom while a hot shower is running.  Limit your exposure to cool or dry air. Rest   Rest as much as possible.  Sleep with your head raised (elevated).  Make sure to get enough sleep each night. General instructions   Apply a warm, moist washcloth to your face 3-4 times a day or as told by your health care provider. This will help with discomfort.  Wash your hands often with soap and water to reduce your exposure to viruses and other germs. If soap and water are not available, use hand sanitizer.  Do not smoke. Avoid being around people who are smoking (secondhand smoke).  Keep all  follow-up visits as told by your health care provider. This is important. Contact a health care provider if:  You have a fever.  Your symptoms get worse.  Your symptoms do not improve within 10 days. Get help right away if:  You have a severe headache.  You have persistent vomiting.  You have pain or swelling around your face or eyes.  You have vision problems.  You develop confusion.  Your neck is stiff.  You have trouble breathing. This information is not intended to replace advice given to you by your health care provider. Make sure you discuss any questions you have with your health care provider. Document Released: 11/26/2005 Document Revised: 07/22/2016 Document Reviewed: 09/21/2015 Elsevier Interactive Patient Education  2017 [employer] Allergic Rhinitis Allergic rhinitis is when the mucous membranes in the nose respond to allergens. Allergens are particles in the air that cause your body to have an allergic reaction. This causes you to release allergic antibodies. Through a chain of events, these eventually cause you to release histamine into the blood stream. Although meant to protect the body, it  is this release of histamine that causes your discomfort, such as frequent sneezing, congestion, and an itchy, runny nose. What are the causes? Seasonal allergic rhinitis (hay fever) is caused by pollen allergens that may come from grasses, trees, and weeds. Year-round allergic rhinitis (perennial allergic rhinitis) is caused by allergens such as house dust mites, pet dander, and mold spores. What are the signs or symptoms?  Nasal stuffiness (congestion).  Itchy, runny nose with sneezing and tearing of the eyes. How is this diagnosed? Your health care provider can help you determine the allergen or allergens that trigger your symptoms. If you and your health care provider are unable to determine the allergen, skin or blood testing may be used. Your health care provider will  diagnose your condition after taking your health history and performing a physical exam. Your health care provider may assess you for other related conditions, such as asthma, pink eye, or an ear infection. How is this treated? Allergic rhinitis does not have a cure, but it can be controlled by:  Medicines that block allergy symptoms. These may include allergy shots, nasal sprays, and oral antihistamines.  Avoiding the allergen. Hay fever may often be treated with antihistamines in pill or nasal spray forms. Antihistamines block the effects of histamine. There are over-the-counter medicines that may help with nasal congestion and swelling around the eyes. Check with your health care provider before taking or giving this medicine. If avoiding the allergen or the medicine prescribed do not work, there are many new medicines your health care provider can prescribe. Stronger medicine may be used if initial measures are ineffective. Desensitizing injections can be used if medicine and avoidance does not work. Desensitization is when a patient is given ongoing shots until the body becomes less sensitive to the allergen. Make sure you follow up with your health care provider if problems continue. Follow these instructions at home: It is not possible to completely avoid allergens, but you can reduce your symptoms by taking steps to limit your exposure to them. It helps to know exactly what you are allergic to so that you can avoid your specific triggers. Contact a health care provider if:  You have a fever.  You develop a cough that does not stop easily (persistent).  You have shortness of breath.  You start wheezing.  Symptoms interfere with normal daily activities. This information is not intended to replace advice given to you by your health care provider. Make sure you discuss any questions you have with your health care provider. Document Released: 08/21/2001 Document Revised: 07/27/2016 Document  Reviewed: 08/03/2013 Elsevier Interactive Patient Education  2017 [employer].

## 2017-04-23 ENCOUNTER — Telehealth: Payer: Self-pay | Admitting: Registered Nurse

## 2017-04-23 ENCOUNTER — Encounter: Payer: Self-pay | Admitting: Registered Nurse

## 2017-04-23 NOTE — Telephone Encounter (Signed)
Married 61y/o male with increased frequency palpations noted when at home in the evening after work; patient still experiencing shortness of breath with exertion also.  Feet/lower leg swelling improved today.  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  Patient stated he is taking all his medications as prescribed.  Showering before bedtime due to high pollen counts/seasonal allergy flare.  Denied fever, nausea ,vomiting, coughing up blood, dizzyness, passing out, headaches, chest pain, visual changes.  Verified with patient how to check his pulse he reported irregular heartbeat.  Discussed with patient recommended EKG/follow up with cardiology today for re-evaluation as EKG not available in [facility].  Instructed patient to contact Dr Lady Gary and [facility].  Discussed with patient to hold any further caffeinated drinks today.  Has had two cups of coffee so far today (tumblers).  Discussed with patient suspect DOE related to his heart but could be lung related due to allergy flare consider PFTs/further work up with PCM/cardiology to rule out asthma/COPD even though history is non-smoker  Seasonal allergies have been worsening.  Patient verbalized understanding information/instructions, agreed with plan of care and had no further questions at this time.

## 2017-04-30 ENCOUNTER — Ambulatory Visit: Payer: Self-pay | Admitting: Registered Nurse

## 2017-04-30 VITALS — BP 160/110 | HR 59

## 2017-04-30 DIAGNOSIS — I10 Essential (primary) hypertension: Secondary | ICD-10-CM

## 2017-04-30 DIAGNOSIS — J302 Other seasonal allergic rhinitis: Secondary | ICD-10-CM

## 2017-04-30 MED ORDER — SALINE SPRAY 0.65 % NA SOLN: 2.0000 | NASAL | 0 refills | Status: DC

## 2017-04-30 MED ORDER — FLUTICASONE PROPIONATE 50 MCG/ACT NA SUSP: 1.0000 | Freq: Two times a day (BID) | NASAL | 0 refills | Status: DC

## 2017-04-30 MED ORDER — ATENOLOL 25 MG PO TABS: 25.0000 mg | ORAL_TABLET | Freq: Two times a day (BID) | ORAL | 0 refills | Status: DC

## 2017-04-30 NOTE — Patient Instructions (Signed)
Allergic Rhinitis Allergic rhinitis is when the mucous membranes in the nose respond to allergens. Allergens are particles in the air that cause your body to have an allergic reaction. This causes you to release allergic antibodies. Through a chain of events, these eventually cause you to release histamine into the blood stream. Although meant to protect the body, it is this release of histamine that causes your discomfort, such as frequent sneezing, congestion, and an itchy, runny nose. What are the causes? Seasonal allergic rhinitis (hay fever) is caused by pollen allergens that may come from grasses, trees, and weeds. Year-round allergic rhinitis (perennial allergic rhinitis) is caused by allergens such as house dust mites, pet dander, and mold spores. What are the signs or symptoms? Nasal stuffiness (congestion). Itchy, runny nose with sneezing and tearing of the eyes. How is this diagnosed? Your health care provider can help you determine the allergen or allergens that trigger your symptoms. If you and your health care provider are unable to determine the allergen, skin or blood testing may be used. Your health care provider will diagnose your condition after taking your health history and performing a physical exam. Your health care provider may assess you for other related conditions, such as asthma, pink eye, or an ear infection. How is this treated? Allergic rhinitis does not have a cure, but it can be controlled by: Medicines that block allergy symptoms. These may include allergy shots, nasal sprays, and oral antihistamines. Avoiding the allergen. Hay fever may often be treated with antihistamines in pill or nasal spray forms. Antihistamines block the effects of histamine. There are over-the-counter medicines that may help with nasal congestion and swelling around the eyes. Check with your health care provider before taking or giving this medicine. If avoiding the allergen or the medicine  prescribed do not work, there are many new medicines your health care provider can prescribe. Stronger medicine may be used if initial measures are ineffective. Desensitizing injections can be used if medicine and avoidance does not work. Desensitization is when a patient is given ongoing shots until the body becomes less sensitive to the allergen. Make sure you follow up with your health care provider if problems continue. Follow these instructions at home: It is not possible to completely avoid allergens, but you can reduce your symptoms by taking steps to limit your exposure to them. It helps to know exactly what you are allergic to so that you can avoid your specific triggers. Contact a health care provider if: You have a fever. You develop a cough that does not stop easily (persistent). You have shortness of breath. You start wheezing. Symptoms interfere with normal daily activities. This information is not intended to replace advice given to you by your health care provider. Make sure you discuss any questions you have with your health care provider. Document Released: 08/21/2001 Document Revised: 07/27/2016 Document Reviewed: 08/03/2013 Elsevier Interactive Patient Education  2017 [employer] Sinus Rinse What is a sinus rinse? A sinus rinse is a simple home treatment that is used to rinse your sinuses with a sterile mixture of salt and water (saline solution). Sinuses are air-filled spaces in your skull behind the bones of your face and forehead that open into your nasal cavity. You will use the following: Saline solution. Neti pot or spray bottle. This releases the saline solution into your nose and through your sinuses. Neti pots and spray bottles can be purchased at [employer], a health food store, or online. When would I  do a sinus rinse? A sinus rinse can help to clear mucus, dirt, dust, or pollen from the nasal cavity. You may do a sinus rinse when you have a cold, a  virus, nasal allergy symptoms, a sinus infection, or stuffiness in the nose or sinuses. If you are considering a sinus rinse: Ask your child's health care provider before performing a sinus rinse on your child. Do not do a sinus rinse if you have had ear or nasal surgery, ear infection, or blocked ears. How do I do a sinus rinse? Wash your hands. Disinfect your device according to the directions provided and then dry it. Use the solution that comes with your device or one that is sold separately in stores. Follow the mixing directions on the package. Fill your device with the amount of saline solution as directed by the device instructions. Stand over a sink and tilt your head sideways over the sink. Place the spout of the device in your upper nostril (the one closer to the ceiling). Gently pour or squeeze the saline solution into the nasal cavity. The liquid should drain to the lower nostril if you are not overly congested. Gently blow your nose. Blowing too hard may cause ear pain. Repeat in the other nostril. Clean and rinse your device with clean water and then air-dry it. Are there risks of a sinus rinse? Sinus rinse is generally very safe and effective. However, there are a few risks, which include: A burning sensation in the sinuses. This may happen if you do not make the saline solution as directed. Make sure to follow all directions when making the saline solution. Infection from contaminated water. This is rare, but possible. Nasal irritation. This information is not intended to replace advice given to you by your health care provider. Make sure you discuss any questions you have with your health care provider. Document Released: 06/23/2014 Document Revised: 10/23/2016 Document Reviewed: 04/13/2014 Elsevier Interactive Patient Education  2017 [employer]. Palpitations A palpitation is the feeling that your heartbeat is irregular or is faster than normal. It may feel like your  heart is fluttering or skipping a beat. Palpitations are usually not a serious problem. They may be caused by many things, including smoking, caffeine, alcohol, stress, and certain medicines. Although most causes of palpitations are not serious, palpitations can be a sign of a serious medical problem. In some cases, you may need further medical evaluation. Follow these instructions at home: Pay attention to any changes in your symptoms. Take these actions to help with your condition:  Avoid the following:  Caffeinated coffee, tea, soft drinks, diet pills, and energy drinks.  Chocolate.  Alcohol.  Do not use any tobacco products, such as cigarettes, chewing tobacco, and e-cigarettes. If you need help quitting, ask your health care provider.  Try to reduce your stress and anxiety. Things that can help you relax include:  Yoga.  Meditation.  Physical activity, such as swimming, jogging, or walking.  Biofeedback. This is a method that helps you learn to use your mind to control things in your body, such as your heartbeats.  Get plenty of rest and sleep.  Take over-the-counter and prescription medicines only as told by your health care provider.  Keep all follow-up visits as told by your health care provider. This is important. Contact a health care provider if:  You continue to have a fast or irregular heartbeat after 24 hours.  Your palpitations occur more often. Get help right away if:  You  have chest pain or shortness of breath.  You have a severe headache.  You feel dizzy or you faint. This information is not intended to replace advice given to you by your health care provider. Make sure you discuss any questions you have with your health care provider. Document Released: 11/23/2000 Document Revised: 04/30/2016 Document Reviewed: 08/11/2015 Elsevier Interactive Patient Education  2017 [employer] Inc[employer] Hypertension Hypertension, commonly called high blood pressure, is when  the force of blood pumping through the arteries is too strong. The arteries are the blood vessels that carry blood from the heart throughout the body. Hypertension forces the heart to work harder to pump blood and may cause arteries to become narrow or stiff. Having untreated or uncontrolled hypertension can cause heart attacks, strokes, kidney disease, and other problems. A blood pressure reading consists of a higher number over a lower number. Ideally, your blood pressure should be below 120/80. The first ("top") number is called the systolic pressure. It is a measure of the pressure in your arteries as your heart beats. The second ("bottom") number is called the diastolic pressure. It is a measure of the pressure in your arteries as the heart relaxes. What are the causes? The cause of this condition is not known. What increases the risk? Some risk factors for high blood pressure are under your control. Others are not. Factors you can change   Smoking.  Having type 2 diabetes mellitus, high cholesterol, or both.  Not getting enough exercise or physical activity.  Being overweight.  Having too much fat, sugar, calories, or salt (sodium) in your diet.  Drinking too much alcohol. Factors that are difficult or impossible to change   Having chronic kidney disease.  Having a family history of high blood pressure.  Age. Risk increases with age.  Race. You may be at higher risk if you are African-American.  Gender. Men are at higher risk than women before age 45. After age 35, women are at higher risk than men.  Having obstructive sleep apnea.  Stress. What are the signs or symptoms? Extremely high blood pressure (hypertensive crisis) may cause:  Headache.  Anxiety.  Shortness of breath.  Nosebleed.  Nausea and vomiting.  Severe chest pain.  Jerky movements you cannot control (seizures). How is this diagnosed? This condition is diagnosed by measuring your blood pressure  while you are seated, with your arm resting on a surface. The cuff of the blood pressure monitor will be placed directly against the skin of your upper arm at the level of your heart. It should be measured at least twice using the same arm. Certain conditions can cause a difference in blood pressure between your right and left arms. Certain factors can cause blood pressure readings to be lower or higher than normal (elevated) for a short period of time:  When your blood pressure is higher when you are in a health care provider's office than when you are at home, this is called white coat hypertension. Most people with this condition do not need medicines.  When your blood pressure is higher at home than when you are in a health care provider's office, this is called masked hypertension. Most people with this condition may need medicines to control blood pressure. If you have a high blood pressure reading during one visit or you have normal blood pressure with other risk factors:  You may be asked to return on a different day to have your blood pressure checked again.  You may be  asked to monitor your blood pressure at home for 1 week or longer. If you are diagnosed with hypertension, you may have other blood or imaging tests to help your health care provider understand your overall risk for other conditions. How is this treated? This condition is treated by making healthy lifestyle changes, such as eating healthy foods, exercising more, and reducing your alcohol intake. Your health care provider may prescribe medicine if lifestyle changes are not enough to get your blood pressure under control, and if:  Your systolic blood pressure is above 130.  Your diastolic blood pressure is above 80. Your personal target blood pressure may vary depending on your medical conditions, your age, and other factors. Follow these instructions at home: Eating and drinking   Eat a diet that is high in fiber and  potassium, and low in sodium, added sugar, and fat. An example eating plan is called the DASH (Dietary Approaches to Stop Hypertension) diet. To eat this way:  Eat plenty of fresh fruits and vegetables. Try to fill half of your plate at each meal with fruits and vegetables.  Eat whole grains, such as whole wheat pasta, brown rice, or whole grain bread. Fill about one quarter of your plate with whole grains.  Eat or drink low-fat dairy products, such as skim milk or low-fat yogurt.  Avoid fatty cuts of meat, processed or cured meats, and poultry with skin. Fill about one quarter of your plate with lean proteins, such as fish, chicken without skin, beans, eggs, and tofu.  Avoid premade and processed foods. These tend to be higher in sodium, added sugar, and fat.  Reduce your daily sodium intake. Most people with hypertension should eat less than 1,500 mg of sodium a day.  Limit alcohol intake to no more than 1 drink a day for nonpregnant women and 2 drinks a day for men. One drink equals 12 oz of beer, 5 oz of wine, or 1 oz of hard liquor. Lifestyle   Work with your health care provider to maintain a healthy body weight or to lose weight. Ask what an ideal weight is for you.  Get at least 30 minutes of exercise that causes your heart to beat faster (aerobic exercise) most days of the week. Activities may include walking, swimming, or biking.  Include exercise to strengthen your muscles (resistance exercise), such as pilates or lifting weights, as part of your weekly exercise routine. Try to do these types of exercises for 30 minutes at least 3 days a week.  Do not use any products that contain nicotine or tobacco, such as cigarettes and e-cigarettes. If you need help quitting, ask your health care provider.  Monitor your blood pressure at home as told by your health care provider.  Keep all follow-up visits as told by your health care provider. This is important. Medicines   Take  over-the-counter and prescription medicines only as told by your health care provider. Follow directions carefully. Blood pressure medicines must be taken as prescribed.  Do not skip doses of blood pressure medicine. Doing this puts you at risk for problems and can make the medicine less effective.  Ask your health care provider about side effects or reactions to medicines that you should watch for. Contact a health care provider if:  You think you are having a reaction to a medicine you are taking.  You have headaches that keep coming back (recurring).  You feel dizzy.  You have swelling in your ankles.  You have  trouble with your vision. Get help right away if:  You develop a severe headache or confusion.  You have unusual weakness or numbness.  You feel faint.  You have severe pain in your chest or abdomen.  You vomit repeatedly.  You have trouble breathing. Summary  Hypertension is when the force of blood pumping through your arteries is too strong. If this condition is not controlled, it may put you at risk for serious complications.  Your personal target blood pressure may vary depending on your medical conditions, your age, and other factors. For most people, a normal blood pressure is less than 120/80.  Hypertension is treated with lifestyle changes, medicines, or a combination of both. Lifestyle changes include weight loss, eating a healthy, low-sodium diet, exercising more, and limiting alcohol. This information is not intended to replace advice given to you by your health care provider. Make sure you discuss any questions you have with your health care provider. Document Released: 11/26/2005 Document Revised: 10/24/2016 Document Reviewed: 10/24/2016 Elsevier Interactive Patient Education  2017 [employer] Inc. DASH Eating Plan DASH stands for "Dietary Approaches to Stop Hypertension." The DASH eating plan is a healthy eating plan that has been shown to reduce high  blood pressure (hypertension). It may also reduce your risk for type 2 diabetes, heart disease, and stroke. The DASH eating plan may also help with weight loss. What are tips for following this plan? General guidelines   Avoid eating more than 2,300 mg (milligrams) of salt (sodium) a day. If you have hypertension, you may need to reduce your sodium intake to 1,500 mg a day.  Limit alcohol intake to no more than 1 drink a day for nonpregnant women and 2 drinks a day for men. One drink equals 12 oz of beer, 5 oz of wine, or 1 oz of hard liquor.  Work with your health care provider to maintain a healthy body weight or to lose weight. Ask what an ideal weight is for you.  Get at least 30 minutes of exercise that causes your heart to beat faster (aerobic exercise) most days of the week. Activities may include walking, swimming, or biking.  Work with your health care provider or diet and nutrition specialist (dietitian) to adjust your eating plan to your individual calorie needs. Reading food labels   Check food labels for the amount of sodium per serving. Choose foods with less than 5 percent of the Daily Value of sodium. Generally, foods with less than 300 mg of sodium per serving fit into this eating plan.  To find whole grains, look for the word "whole" as the first word in the ingredient list. Shopping   Buy products labeled as "low-sodium" or "no salt added."  Buy fresh foods. Avoid canned foods and premade or frozen meals. Cooking   Avoid adding salt when cooking. Use salt-free seasonings or herbs instead of table salt or sea salt. Check with your health care provider or pharmacist before using salt substitutes.  Do not fry foods. Cook foods using healthy methods such as baking, boiling, grilling, and broiling instead.  Cook with heart-healthy oils, such as olive, canola, soybean, or sunflower oil. Meal planning    Eat a balanced diet that includes:  5 or more servings of fruits  and vegetables each day. At each meal, try to fill half of your plate with fruits and vegetables.  Up to 6-8 servings of whole grains each day.  Less than 6 oz of lean meat, poultry, or fish each  day. A 3-oz serving of meat is about the same size as a deck of cards. One egg equals 1 oz.  2 servings of low-fat dairy each day.  A serving of nuts, seeds, or beans 5 times each week.  Heart-healthy fats. Healthy fats called Omega-3 fatty acids are found in foods such as flaxseeds and coldwater fish, like sardines, salmon, and mackerel.  Limit how much you eat of the following:  Canned or prepackaged foods.  Food that is high in trans fat, such as fried foods.  Food that is high in saturated fat, such as fatty meat.  Sweets, desserts, sugary drinks, and other foods with added sugar.  Full-fat dairy products.  Do not salt foods before eating.  Try to eat at least 2 vegetarian meals each week.  Eat more home-cooked food and less restaurant, buffet, and fast food.  When eating at a restaurant, ask that your food be prepared with less salt or no salt, if possible. What foods are recommended? The items listed may not be a complete list. Talk with your dietitian about what dietary choices are best for you. Grains  Whole-grain or whole-wheat bread. Whole-grain or whole-wheat pasta. Brown rice. Orpah Cobb. Bulgur. Whole-grain and low-sodium cereals. Pita bread. Low-fat, low-sodium crackers. Whole-wheat flour tortillas. Vegetables  Fresh or frozen vegetables (raw, steamed, roasted, or grilled). Low-sodium or reduced-sodium tomato and vegetable juice. Low-sodium or reduced-sodium tomato sauce and tomato paste. Low-sodium or reduced-sodium canned vegetables. Fruits  All fresh, dried, or frozen fruit. Canned fruit in natural juice (without added sugar). Meat and other protein foods  Skinless chicken or [place]. Ground chicken or [place]. Pork with fat trimmed off. Fish and seafood. Egg  whites. Dried beans, peas, or lentils. Unsalted nuts, nut butters, and seeds. Unsalted canned beans. Lean cuts of beef with fat trimmed off. Low-sodium, lean deli meat. Dairy  Low-fat (1%) or fat-free (skim) milk. Fat-free, low-fat, or reduced-fat cheeses. Nonfat, low-sodium ricotta or cottage cheese. Low-fat or nonfat yogurt. Low-fat, low-sodium cheese. Fats and oils  Soft margarine without trans fats. Vegetable oil. Low-fat, reduced-fat, or light mayonnaise and salad dressings (reduced-sodium). Canola, safflower, olive, soybean, and sunflower oils. Avocado. Seasoning and other foods  Herbs. Spices. Seasoning mixes without salt. Unsalted popcorn and pretzels. Fat-free sweets. What foods are not recommended? The items listed may not be a complete list. Talk with your dietitian about what dietary choices are best for you. Grains  Baked goods made with fat, such as croissants, muffins, or some breads. Dry pasta or rice meal packs. Vegetables  Creamed or fried vegetables. Vegetables in a cheese sauce. Regular canned vegetables (not low-sodium or reduced-sodium). Regular canned tomato sauce and paste (not low-sodium or reduced-sodium). Regular tomato and vegetable juice (not low-sodium or reduced-sodium). Rosita Fire. Olives. Fruits  Canned fruit in a light or heavy syrup. Fried fruit. Fruit in cream or butter sauce. Meat and other protein foods  Fatty cuts of meat. Ribs. Fried meat. Tomasa Blase. Sausage. Bologna and other processed lunch meats. Salami. Fatback. Hotdogs. Bratwurst. Salted nuts and seeds. Canned beans with added salt. Canned or smoked fish. Whole eggs or egg yolks. Chicken or [place] with skin. Dairy  Whole or 2% milk, cream, and half-and-half. Whole or full-fat cream cheese. Whole-fat or sweetened yogurt. Full-fat cheese. Nondairy creamers. Whipped toppings. Processed cheese and cheese spreads. Fats and oils  Butter. Stick margarine. Lard. Shortening. Ghee. Bacon fat. Tropical oils, such as  coconut, palm kernel, or palm oil. Seasoning and other foods  Salted popcorn and  pretzels. Onion salt, garlic salt, seasoned salt, table salt, and sea salt. Worcestershire sauce. Tartar sauce. Barbecue sauce. Teriyaki sauce. Soy sauce, including reduced-sodium. Steak sauce. Canned and packaged gravies. Fish sauce. Oyster sauce. Cocktail sauce. Horseradish that you find on the shelf. Ketchup. Mustard. Meat flavorings and tenderizers. Bouillon cubes. Hot sauce and Tabasco sauce. Premade or packaged marinades. Premade or packaged taco seasonings. Relishes. Regular salad dressings. Where to find more information:  National Heart, Lung, and Blood Institute: PopSteam.is  American Heart Association: www.heart.org Summary  The DASH eating plan is a healthy eating plan that has been shown to reduce high blood pressure (hypertension). It may also reduce your risk for type 2 diabetes, heart disease, and stroke.  With the DASH eating plan, you should limit salt (sodium) intake to 2,300 mg a day. If you have hypertension, you may need to reduce your sodium intake to 1,500 mg a day.  When on the DASH eating plan, aim to eat more fresh fruits and vegetables, whole grains, lean proteins, low-fat dairy, and heart-healthy fats.  Work with your health care provider or diet and nutrition specialist (dietitian) to adjust your eating plan to your individual calorie needs. This information is not intended to replace advice given to you by your health care provider. Make sure you discuss any questions you have with your health care provider. Document Released: 11/15/2011 Document Revised: 11/19/2016 Document Reviewed: 11/19/2016 Elsevier Interactive Patient Education  2017 [employer].

## 2017-04-30 NOTE — Progress Notes (Addendum)
Subjective:    Patient ID: Ryan Glass, male    DOB: 1956/11/07, 61 y.o.   MRN: [id number]  61y/o married hispanic male presents for medication refill. Ran out of atenolol and potassium medications Saturday. Also needs flonase, Diclofenac and furosemide. BP elevated today. 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  Leg swelling minimal.  Denied visual changes, chest pain, headache.  Noticed irregular pulse again this weekend at home; today not feeling palpitations.  Asking for enough medications to cover his international vacation to [place] end of June/beginning of July also to be dispensed from clinic formulary.  Patient has not called Dr America Brown office regarding palpitations.      Review of Systems  Constitutional: Negative for activity change, appetite change, chills, diaphoresis, fatigue, fever and unexpected weight change.  HENT: Positive for congestion, postnasal drip, rhinorrhea, sinus pressure, sneezing and sore throat. Negative for dental problem, drooling, ear discharge, ear pain, facial swelling, hearing loss, mouth sores, nosebleeds, sinus pain, tinnitus, trouble swallowing and voice change.   Eyes: Negative for photophobia, pain, discharge, redness, itching and visual disturbance.  Respiratory: Positive for shortness of breath. Negative for cough, choking, chest tightness, wheezing and stridor.   Cardiovascular: Positive for palpitations and leg swelling. Negative for chest pain.  Gastrointestinal: Negative for abdominal distention, abdominal pain, blood in stool, constipation, diarrhea, nausea and vomiting.  Endocrine: Negative for cold intolerance and heat intolerance.  Genitourinary: Negative for dysuria.  Musculoskeletal: Positive for myalgias. Negative for arthralgias, back pain, gait problem, joint swelling, neck pain and neck stiffness.  Skin: Negative for color change, pallor, rash and  wound.  Allergic/Immunologic: Positive for environmental allergies. Negative for food allergies and immunocompromised state.  Neurological: Negative for dizziness, tremors, seizures, syncope, facial asymmetry, speech difficulty, weakness, light-headedness, numbness and headaches.  Hematological: Negative for adenopathy. Does not bruise/bleed easily.  Psychiatric/Behavioral: Negative for agitation, behavioral problems, confusion and sleep disturbance.       Objective:   Physical Exam  Constitutional: He is oriented to person, place, and time. He appears well-developed and well-nourished. He is active and cooperative.  Non-toxic appearance. He does not have a sickly appearance. He does not appear ill. No distress.  HENT:  Head: Normocephalic and atraumatic.  Right Ear: Hearing, external ear and ear canal normal. A middle ear effusion is present.  Left Ear: Hearing, external ear and ear canal normal. A middle ear effusion is present.  Nose: Mucosal edema and rhinorrhea present. No nose lacerations, sinus tenderness, nasal deformity, septal deviation or nasal septal hematoma. No epistaxis.  No foreign bodies. Right sinus exhibits no maxillary sinus tenderness and no frontal sinus tenderness. Left sinus exhibits no maxillary sinus tenderness and no frontal sinus tenderness.  Mouth/Throat: Uvula is midline and mucous membranes are normal. Mucous membranes are not pale, not dry and not cyanotic. He does not have dentures. No oral lesions. No trismus in the jaw. Normal dentition. No dental abscesses, uvula swelling, lacerations or dental caries. Posterior oropharyngeal edema and posterior oropharyngeal erythema present. No oropharyngeal exudate or tonsillar abscesses.  Cobblestoning posterior pharynx; bilateral TMs air fluid level clear; bilateral allergic shiners; bilateral nasal turbinates scant yellow discharge  Eyes: Conjunctivae, EOM and lids are normal. Pupils are equal, round, and reactive to light.  Right eye exhibits no chemosis, no discharge, no exudate and no hordeolum. No foreign body present in the right eye. Left eye exhibits no chemosis, no discharge, no  exudate and no hordeolum. No foreign body present in the left eye. Right conjunctiva is not injected. Right conjunctiva has no hemorrhage. Left conjunctiva is not injected. Left conjunctiva has no hemorrhage. No scleral icterus. Right eye exhibits normal extraocular motion and no nystagmus. Left eye exhibits normal extraocular motion and no nystagmus. Right pupil is round and reactive. Left pupil is round and reactive. Pupils are equal.  Neck: Trachea normal, normal range of motion and phonation normal. Neck supple. No tracheal tenderness, no spinous process tenderness and no muscular tenderness present. No neck rigidity. No tracheal deviation, no edema, no erythema and normal range of motion present. No thyroid mass and no thyromegaly present.  Cardiovascular: Normal rate, regular rhythm, S1 normal, S2 normal, normal heart sounds and intact distal pulses.  PMI is not displaced.  Exam reveals no gallop and no friction rub.   No murmur heard. Pulses:      Radial pulses are 2+ on the right side, and 2+ on the left side.  Pulmonary/Chest: Effort normal and breath sounds normal. No stridor. No respiratory distress. He has no decreased breath sounds. He has no wheezes. He has no rhonchi. He has no rales.  Abdominal: Soft. He exhibits no distension. There is no guarding.  Musculoskeletal: He exhibits edema. He exhibits no tenderness or deformity.       Right shoulder: Normal.       Left shoulder: Normal.       Right elbow: Normal.      Left elbow: Normal.       Right hip: Normal.       Left hip: Normal.       Right knee: Normal.       Left knee: Normal.       Right ankle: He exhibits swelling. He exhibits normal range of motion, no ecchymosis, no deformity, no laceration and normal pulse. No tenderness. Achilles tendon normal.       Left  ankle: He exhibits swelling. He exhibits normal range of motion, no ecchymosis, no deformity, no laceration and normal pulse. No tenderness. Achilles tendon normal.       Cervical back: Normal.       Right hand: Normal.       Left hand: Normal.  Scant pitting edema at sockline bilaterally ankle 0-1+/4  Lymphadenopathy:       Head (right side): No submental, no submandibular, no tonsillar, no preauricular, no posterior auricular and no occipital adenopathy present.       Head (left side): No submental, no submandibular, no tonsillar, no preauricular, no posterior auricular and no occipital adenopathy present.    He has no cervical adenopathy.       Right cervical: No superficial cervical, no deep cervical and no posterior cervical adenopathy present.      Left cervical: No superficial cervical, no deep cervical and no posterior cervical adenopathy present.  Neurological: He is alert and oriented to person, place, and time. He has normal strength. He is not disoriented. He displays no atrophy and no tremor. No cranial nerve deficit or sensory deficit. He exhibits normal muscle tone. He displays no seizure activity. Coordination and gait normal. GCS eye subscore is 4. GCS verbal subscore is 5. GCS motor subscore is 6.  Skin: Skin is warm, dry and intact. No abrasion, no bruising, no burn, no ecchymosis, no laceration, no lesion, no petechiae and no rash noted. He is not diaphoretic. No cyanosis or erythema. No pallor. Nails show no clubbing.  Psychiatric: He  has a normal mood and affect. His speech is normal and behavior is normal. Judgment and thought content normal. Cognition and memory are normal.  Nursing note and vitals reviewed.     Assessment & Plan:  A-uncontrolled hypertension, acute seasonal allergic rhinitis and bilateral otitis media effusion  P-Emphasized with patient to count pills left in bottle and not just filling his two week organizer and to plan ahead to prevent running out of  medications on weekends/holidays/clinic closure days.  Rx atenolol 25mg  po BID #[facility] up.  Dispensed another bottle amlodipine 10mg  po daily #30 RF0 to cover international vacation dates June/July.  Dispensed potassium chloride take 2 tabs po BID #240 from [facility] and instructed patient to take his usual dose now as missed doses since Saturday (4 days). Continue current medications as prescribed by Dr (cardiology) and PCM.   Continue to monitor blood pressure at home and maintain log of blood pressure and pulse to bring to follow up appointments.  Continue low sodium diet and exercise program.  Recommended weight loss/weight maintenance to BMI 20-25.  Return to the clinic if any new symptoms. Exitcare handout on hypertension and dash diet given to patient.   Patient verbalized agreement and understanding of treatment plan and had no further questions at this time.   P2:  Diet and Exercise specific for HTN  Continue singulair 10mg  po qhs and antihistamine OTC po daily at home.  Refilled fluticasone Thursday nasal spray 1 spray each nostril BID 2 bottles from [facility].  Continue nasal saline 2 sprays each nostril q2h wa prn congestion at home.  if worsening palpitations/shortness of breath/chest pain/worst headache of his life seek immediate re-evaluation with cardiology and/or ER of choice.  SP02 and functional exam much improved this week compared to previous and breath sounds without adventitious sounds.  Holding oral steroids at this time due to chronically elevated blood pressure despite medications.  Avoid triggers if possible.  Shower prior to bedtime if exposed to triggers.  If allergic dust/dust mites recommend mattress/pillow covers/encasements; washing linens, vacuuming, sweeping, dusting weekly.  Call or return to clinic as needed if these symptoms worsen or fail to improve as anticipated.   Exitcare handout on sinus rinse and allergic rhinitis given to  patient.  Patient verbalized understanding of instructions, agreed with plan of care and had no further questions at this time.   Supportive treatment.   No evidence of invasive bacterial infection, non toxic and well hydrated.  This is most likely self limiting viral infection.  I do not see where any further testing or imaging is necessary at this time.   I will suggest supportive care, rest, good hygiene and encourage the patient to take adequate fluids.  The patient is to return to clinic or EMERGENCY ROOM if symptoms worsen or change significantly e.g. ear pain, fever, purulent discharge from ears or bleeding.  Exitcare handout on otitis media with effusion given to patient.  Patient verbalized agreement and understanding of treatment plan.

## 2017-05-02 ENCOUNTER — Ambulatory Visit: Payer: Self-pay | Admitting: Registered Nurse

## 2017-05-02 VITALS — HR 60 | Resp 18

## 2017-05-02 DIAGNOSIS — I10 Essential (primary) hypertension: Secondary | ICD-10-CM

## 2017-05-02 NOTE — Patient Instructions (Signed)
Palpitaciones (Palpitations) Es la sensacin de sentir que el latido cardaco es irregular o es ms rpido que lo normal. Puede sentir como un aleteo o que falta un latido. Generalmente no es un problema grave. Las causas de las palpitaciones pueden ser diversas, entre ellas, el estrs y consumo de cigarrillos, cafena, alcohol y determinados medicamentos. Si bien la [employer]ley[employer]son de las causas de las palpitaciones no son graves, estas pueden ser un signo de un problema mdico grave. En algunos casos, podra ser necesario hacer ms estudios. INSTRUCCIONES PARA EL CUIDADO EN EL HOGAR Est atento a cualquier cambio en los sntomas. Tome estas medidas para controlar la afeccin:  Evite consumir lo siguiente:  Advice worker que contengan cafena como el caf, el t, los refrescos, las pastillas para Geophysical data processor y las bebidas energizantes.  Chocolate.  Alcohol.  No consuma ningn producto que contenga tabaco, lo que incluye cigarrillos, tabaco de Theatre manager y Administrator, Civil Service. Si necesita ayuda para dejar de fumar, consulte al [employer].  Trate de reducir los niveles de estrs y [place]. Las siguientes actividades pueden ayudarlo a relajarse:  Education administrator yoga.  Meditacin.  Actividad fsica como natacin, trote o caminatas.  Biorretroalimentacin. Este es un mtodo que le ensea a usar la mente para Chief Operating Officer cosas del cuerpo, como los latidos del corazn.  Descanse y duerma lo suficiente.  Tome los medicamentos de venta libre y los recetados solamente como se lo haya indicado el mdico.  Oceanographer a todas las visitas de control como se lo haya indicado el mdico. Esto es importante. SOLICITE ATENCIN MDICA SI:  Contina con latidos cardacos rpidos o irregulares despus de 24 horas.  Las [employer] suceden con ms frecuencia. SOLICITE ATENCIN MDICA DE INMEDIATO SI:  Siente falta de aire o dolor en el pecho.  Tiene un dolor de cabeza intenso.  Se siente mareado o se desmaya. Esta  informacin no tiene Theme park manager el consejo del mdico. Asegrese de hacerle al mdico cualquier pregunta que tenga. Document Released: 09/05/2005 Document Revised: 03/19/2016 Document Reviewed: 08/11/2015 Elsevier Interactive Patient Education  2017 Elsevier Inc. Hipertensin Hypertension La hipertensin, conocida comnmente como presin arterial alta, se produce cuando la sangre bombea en las arterias con mucha fuerza. Las arterias son los vasos sanguneos que transportan la sangre desde el corazn al resto del cuerpo. La hipertensin hace que el corazn haga ms esfuerzo para Insurance account manager y [employer] que las arterias se Armed forces training and education officer o Multimedia programmer. La hipertensin no tratada o no controlada puede causar infarto de miocardio, accidentes cerebrovasculares, enfermedad renal y otros problemas. Una lectura de la presin arterial consiste de un nmero ms alto sobre un nmero ms bajo. En condiciones ideales, la presin arterial debe estar por debajo de 120/80. El primer nmero ("superior") es la presin sistlica. Es la medida de la presin de las arterias cuando el corazn late. El segundo nmero ("inferior") es la presin diastlica. Es la medida de la presin en las arterias cuando el corazn se relaja. Cules son las causas? Se desconoce la causa de esta afeccin. Qu incrementa el riesgo? Algunos factores de riesgo de hipertensin estn bajo su control. Otros no. Factores que puede [employer].  Tener diabetes mellitus tipo 2, colesterol alto, o ambos.  No hacer la cantidad suficiente de actividad fsica o ejercicio.  Tener sobrepeso.  Consumir mucha grasa, azcar, caloras o sal (sodio) en su dieta.  Beber alcohol en exceso. Factores que son difciles o imposibles de modificar   Tener enfermedad renal crnica.  Tener antecedentes familiares  de presin arterial alta.  La edad. Los riesgos aumentan con la edad.  La raza. El riesgo es mayor para las Scientist, research (physical sciences).  El sexo. Antes de los 45aos, los hombres corren ms [employer]. Despus de los 65aos, las mujeres corren ms [employer].  Tener apnea obstructiva del sueo.  El estrs. Cules son los signos o los sntomas? La presin arterial extremadamente alta (crisis hipertensiva) puede provocar:  Dolor de [place].  Ansiedad.  Falta de aire.  Hemorragia nasal.  Nuseas y vmitos.  Dolor de pecho intenso.  Una crisis de movimientos que no puede controlar (convulsiones). Cmo se diagnostica? Esta afeccin se diagnostica midiendo su presin arterial mientras se encuentra sentado, con el brazo apoyado sobre una superficie. El brazalete del tensimetro debe colocarse directamente sobre la piel de la parte superior del brazo y al nivel de su corazn. Debe medirla al [facility] veces en el mismo brazo. Determinadas condiciones pueden causar una diferencia de presin arterial entre el brazo izquierdo y Aeronautical engineer. Ciertos factores pueden provocar que las lecturas de la presin arterial sean inferiores o superiores a lo normal (elevadas) por un perodo corto de tiempo:  Si su presin arterial es ms alta cuando se encuentra en el consultorio del mdico que cuando la mide en su hogar, se denomina "hipertensin de bata blanca". La Harley-Davidson de las personas que tienen esta afeccin no deben ser [employer].  Si su presin arterial es ms alta en el hogar que cuando se encuentra en el consultorio del mdico, se denomina "hipertensin enmascarada". La Harley-Davidson de las personas que tienen esta afeccin deben ser medicadas para Chief Operating Officer la presin arterial. Si tiene una lecturas de presin arterial alta durante una visita o si tiene presin arterial normal con otros factores de riesgo:  Es posible que se le pida que regrese Banker para volver a Chief Operating Officer su presin arterial.  Se le puede pedir que se controle la presin arterial en su casa durante 1 semana o ms. Si se  le diagnostica hipertensin, es posible que se le realicen otros anlisis de sangre o estudios de diagnstico por imgenes para ayudar a su mdico a comprender su riesgo general de tener otras afecciones. Cmo se trata? Esta afeccin se trata haciendo cambios saludables en el estilo de vida, tales como ingerir alimentos saludables, realizar ms ejercicio y reducir el consumo de alcohol. El mdico puede recetarle medicamentos si los cambios en el estilo de vida no son suficientes para Museum/gallery curator la presin arterial y si:  Su presin arterial sistlica est por encima de 130.  Su presin arterial diastlica est por encima de 80. La presin arterial deseada puede variar en funcin de las enfermedades, la edad y otros factores personales. Siga estas instrucciones en su casa: Comida y bebida   Siga una dieta con alto contenido de fibras y [place], y con bajo contenido de sodio, International aid/development worker agregada y Neurosurgeon. Un ejemplo de plan alimenticio es la dieta DASH (Dietary Approaches to Stop Hypertension, Mtodos alimenticios para detener la hipertensin). Para alimentarse de esta manera:  Coma mucha fruta y verdura fresca. Trate de que la mitad del plato de cada comida sea de frutas y verduras.  Coma cereales integrales, como pasta integral, arroz integral y pan integral. Llene aproximadamente un cuarto del plato con cereales integrales.  Coma y beba productos lcteos con bajo contenido de grasa, como leche descremada o yogur bajo en grasas.  Evite la ingesta de cortes de carne grasa, carne  procesada o curada, y carne de ave con piel. Llene aproximadamente un cuarto del plato con protenas magras, como pescado, pollo sin piel, frijoles, huevos y tofu.  Evite ingerir alimentos prehechos o procesados. En general, estos tienen mayor cantidad de sodio, azcar agregada y Steffanie [place].  Reduzca su ingesta diaria de sodio. La mayora de las personas que tienen hipertensin deben comer menos de 1500 mg de sodio por  [employer].  Limite el consumo de alcohol a no ms de 1 medida por da si es mujer y no est Orthoptist y a 2 medidas por da si es hombre. Una medida equivale a 12onzas de cerveza, 5onzas de vino o 1onzas de bebidas alcohlicas de alta graduacin. Estilo de vida   Trabaje con su mdico para mantener un peso saludable o Curator. Pregntele cual es su peso recomendado.  Realice al menos 30 minutos de ejercicio que haga que se acelere su corazn (ejercicio Magazine features editor) la [employer] de la [place]. Estas actividades pueden incluir caminar, nadar o andar en bicicleta.  Incluya ejercicios para fortalecer sus msculos (ejercicios de resistencia), como pilates o levantamiento de pesas, como parte de su rutina semanal de ejercicios. Intente realizar de este tipo de ejercicios al [employer] a la [place].  No consuma ningn producto que contenga nicotina o tabaco, como cigarrillos y Administrator, Civil Service. Si necesita ayuda para dejar de fumar, consulte al mdico.  Contrlese la presin arterial en su casa segn las indicaciones del mdico.  Concurra a todas las visitas de control como se lo haya indicado el mdico. Esto es importante. Medicamentos   [employer] de venta libre y los recetados solamente como se lo haya indicado el mdico. Siga cuidadosamente las indicaciones. Los medicamentos para la presin arterial deben tomarse segn las indicaciones.  No omita las dosis de medicamentos para la presin arterial. Si lo hace, estar en riesgo de tener problemas y puede hacer que los medicamentos sean menos eficaces.  Pregntele a su mdico a qu efectos secundarios o reacciones a los Museum/gallery curator. Comunquese con un mdico si:  Piensa que tiene una reaccin a un medicamento que est tomando.  Tiene dolores de cabeza frecuentes (recurrentes).  Siente mareos.  Tiene hinchazn en los tobillos.  Tiene problemas de visin. Solicite ayuda de  inmediato si:  Siente un dolor de cabeza intenso o confusin.  Siente debilidad inusual o adormecimiento.  Siente que va a desmayarse.  Siente un dolor intenso en el pecho o el abdomen.  Vomita repetidas veces.  Tiene dificultad para respirar. Resumen  La hipertensin se produce cuando la sangre bombea en las arterias con mucha fuerza. Si esta afeccin no se controla, podra correr riesgo de tener complicaciones graves.  La presin arterial deseada puede variar en funcin de las enfermedades, la edad y otros factores personales. Para la [employer], una presin arterial normal es menor que 120/80.  La hipertensin se trata con cambios en el estilo de vida, medicamentos o una combinacin de [employer]. Los [employer] Corporation estilo de vida incluyen prdida de peso, ingerir alimentos sanos, seguir una dieta baja en sodio, hacer ms ejercicio y Glass blower/designer consumo de alcohol. Esta informacin no tiene Theme park manager el consejo del mdico. Asegrese de hacerle al mdico cualquier pregunta que tenga. Document Released: 11/26/2005 Document Revised: 11/07/2016 Document Reviewed: 11/07/2016 Elsevier Interactive Patient Education  2017 [employer].

## 2017-05-02 NOTE — Progress Notes (Signed)
61y/o married hispanic male presents for medication refill running short of atenolol. 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  Leg swelling minimal.  Denied visual changes, chest pain, headache.  Noticed irregular pulse again this weekend at home; today not feeling palpitations.  Asking for enough medications to cover his international vacation to [place] end of June/beginning of July also to be dispensed from clinic formulary.  Patient has not called Dr America Brown office regarding palpitations.     Review of Systems  Constitutional: Negative for activity change, appetite change, chills, diaphoresis, fatigue, fever and unexpected weight change.  HENT: Positive for congestion, postnasal drip, rhinorrhea, sinus pressure, sneezing and sore throat. Negative for dental problem, drooling, ear discharge, ear pain, facial swelling, hearing loss, mouth sores, nosebleeds, sinus pain, tinnitus, trouble swallowing and voice change.   Eyes: Negative for photophobia, pain, discharge, redness, itching and visual disturbance.  Respiratory: Positive for shortness of breath. Negative for cough, choking, chest tightness, wheezing and stridor.   Cardiovascular: Positive for palpitations and leg swelling. Negative for chest pain.  Gastrointestinal: Negative for abdominal distention, abdominal pain, blood in stool, constipation, diarrhea, nausea and vomiting.  Endocrine: Negative for cold intolerance and heat intolerance.  Genitourinary: Negative for dysuria.  Musculoskeletal: Positive for myalgias. Negative for arthralgias, back pain, gait problem, joint swelling, neck pain and neck stiffness.  Skin: Negative for color change, pallor, rash and wound.  Allergic/Immunologic: Positive for environmental allergies. Negative for food allergies and immunocompromised state.  Neurological: Negative for dizziness, tremors, seizures, syncope,  facial asymmetry, speech difficulty, weakness, light-headedness, numbness and headaches.  Hematological: Negative for adenopathy. Does not bruise/bleed easily.  Psychiatric/Behavioral: Negative for agitation, behavioral problems, confusion and sleep disturbance.       Objective:   Physical Exam  Constitutional: He is oriented to person, place, and time. He appears well-developed and well-nourished. He is active and cooperative.  Non-toxic appearance. He does not have a sickly appearance. He does not appear ill. No distress.  HENT:  Head: Normocephalic and atraumatic.  Right Ear: Hearing, external ear and ear canal normal.  Left Ear: Hearing, external ear and ear canal normal. Nose: Mucosal edema and rhinorrhea present. No nose lacerations, sinus tenderness, nasal deformity, septal deviation or nasal septal hematoma. No epistaxis.  No foreign bodies. Right sinus exhibits no maxillary sinus tenderness and no frontal sinus tenderness. Left sinus exhibits no maxillary sinus tenderness and no frontal sinus tenderness.  Mouth/Throat: Uvula is midline and mucous membranes are normal. Mucous membranes are not pale, not dry and not cyanotic. He does not have dentures. No oral lesions. No trismus in the jaw. Normal dentition. No dental abscesses, uvula swelling, lacerations or dental caries. Posterior oropharyngeal edema and posterior oropharyngeal erythema present. No oropharyngeal exudate or tonsillar abscesses.  Cobblestoning posterior pharynx; bilateral allergic shiners; bilateral nasal turbinates scant yellow discharge  Eyes: Conjunctivae, EOM and lids are normal. Pupils are equal, round, and reactive to light. Right eye exhibits no chemosis, no discharge, no exudate and no hordeolum. No foreign body present in the right eye. Left eye exhibits no chemosis, no discharge, no exudate and no hordeolum. No foreign body present in the left eye. Right conjunctiva is not injected. Right conjunctiva has no  hemorrhage. Left conjunctiva is not injected. Left conjunctiva has no hemorrhage. No scleral icterus. Right eye exhibits normal extraocular motion and no nystagmus. Left eye exhibits normal extraocular motion and no nystagmus. Right pupil is  round and reactive. Left pupil is round and reactive. Pupils are equal.  Neck: Trachea normal, normal range of motion and phonation normal. Neck supple. No tracheal tenderness, no spinous process tenderness and no muscular tenderness present. No neck rigidity. No tracheal deviation, no edema, no erythema and normal range of motion present. No thyroid mass and no thyromegaly present.  Cardiovascular: Normal rate, regular rhythm, S1 normal, S2 normal, normal heart sounds and intact distal pulses.  PMI is not displaced.  Exam reveals no gallop and no friction rub.   No murmur heard. Pulses:      Radial pulses are 2+ on the right side, and 2+ on the left side.  Pulmonary/Chest: Effort normal and breath sounds normal. No stridor. No respiratory distress. He has no decreased breath sounds. He has no wheezes. He has no rhonchi. He has no rales.  Abdominal: Soft. He exhibits no distension. There is no guarding.  Musculoskeletal: He exhibits edema. He exhibits no tenderness or deformity.       Right shoulder: Normal.       Left shoulder: Normal.       Right elbow: Normal.      Left elbow: Normal.       Right hip: Normal.       Left hip: Normal.       Right knee: Normal.       Left knee: Normal.       Right ankle: He exhibits swelling. He exhibits normal range of motion, no ecchymosis, no deformity, no laceration and normal pulse. No tenderness. Achilles tendon normal.       Left ankle: He exhibits swelling. He exhibits normal range of motion, no ecchymosis, no deformity, no laceration and normal pulse. No tenderness. Achilles tendon normal.       Cervical back: Normal.       Right hand: Normal.       Left hand: Normal.  Scant pitting edema at sockline bilaterally  ankle 0-1+/4  Lymphadenopathy:       Head (right side): No submental, no submandibular, no tonsillar, no preauricular, no posterior auricular and no occipital adenopathy present.       Head (left side): No submental, no submandibular, no tonsillar, no preauricular, no posterior auricular and no occipital adenopathy present.    He has no cervical adenopathy.       Right cervical: No superficial cervical, no deep cervical and no posterior cervical adenopathy present.      Left cervical: No superficial cervical, no deep cervical and no posterior cervical adenopathy present.  Neurological: He is alert and oriented to person, place, and time. He has normal strength. He is not disoriented. He displays no atrophy and no tremor. No cranial nerve deficit or sensory deficit. He exhibits normal muscle tone. He displays no seizure activity. Coordination and gait normal. GCS eye subscore is 4. GCS verbal subscore is 5. GCS motor subscore is 6.  Skin: Skin is warm, dry and intact. No abrasion, no bruising, no burn, no ecchymosis, no laceration, no lesion, no petechiae and no rash noted. He is not diaphoretic. No cyanosis or erythema. No pallor. Nails show no clubbing.  Psychiatric: He has a normal mood and affect. His speech is normal and behavior is normal. Judgment and thought content normal. Cognition and memory are normal.    Assessment & Plan:  A-uncontrolled hypertension  P-Dispensed atenolol 25mg  po BID #180 RF0 from [facility]. He had not picked up atenolol from [facility] stated he still had a  couple left at home.  Per our records last filled 01/18/2017 received 3 month supply.  Patient has not noticed any further irregular heart beats since Tuesday.  Continue current medications as prescribed by Dr Lady Gary (cardiology) and PCM.   Continue to monitor blood pressure at home and maintain log of blood pressure and pulse to bring to follow up appointments.  Continue low sodium diet and exercise program.  Recommended weight  loss/weight maintenance to BMI 20-25.  Return to the clinic if any new symptoms.   Patient verbalized agreement and understanding of treatment plan and had no further questions at this time.   P2:  Diet and Exercise specific for HTN

## 2017-05-10 ENCOUNTER — Ambulatory Visit: Payer: Self-pay | Admitting: *Deleted

## 2017-05-10 VITALS — BP 136/91 | HR 57

## 2017-05-10 DIAGNOSIS — R002 Palpitations: Secondary | ICD-10-CM

## 2017-05-10 DIAGNOSIS — I499 Cardiac arrhythmia, unspecified: Secondary | ICD-10-CM

## 2017-05-10 DIAGNOSIS — I10 Essential (primary) hypertension: Secondary | ICD-10-CM

## 2017-05-10 NOTE — Progress Notes (Signed)
Pt in for BP and pulse check with RN. BP elevated. Pt reports palpitations were previously intermittent but have now become constant over past week. Checking own pulse throughout day and reports it is always irregular now. Taking atenolol BID, Diovan daily, and lasix daily. Now taking amlodipine 10mg  qhs instead of 5mg  BID. This helps lessen the swelling in his BLE. Today, +1-2.  Pt flying to in 3 weeks, staying for 3 weeks. Encouraged him to call cardiologist for appt prior to traveling since the irreg pulse and his palpitations have become constant and his ShOB has not improved over past 3-4 weeks. Pt agreeable to this. Reports he will call today for an appt.

## 2017-05-17 ENCOUNTER — Ambulatory Visit: Payer: Self-pay | Admitting: *Deleted

## 2017-05-17 VITALS — BP 164/109 | HR 56

## 2017-05-20 ENCOUNTER — Ambulatory Visit: Payer: Self-pay | Admitting: *Deleted

## 2017-05-20 NOTE — Progress Notes (Signed)
Late entry: 05/17/17: Pt denies palpitations. Less ShOB. Has not seen or called cardiology office as advised. BP elevated. Has taken all medications today. Leaving tomorrow for 3 week trip to [place]. ER precautions given, especially regarding flying, DVTs, PE's, etc. Pt verbalizes understanding.

## 2017-05-21 ENCOUNTER — Telehealth: Payer: Self-pay | Admitting: Registered Nurse

## 2017-05-21 ENCOUNTER — Encounter: Payer: Self-pay | Admitting: Registered Nurse

## 2017-05-21 MED ORDER — MONTELUKAST SODIUM 10 MG PO TABS: 10.0000 mg | ORAL_TABLET | Freq: Every day | ORAL | 3 refills | Status: DC

## 2017-05-21 NOTE — Telephone Encounter (Signed)
Patient requested refill singulair 10mg  po qhs #90 RF3.  Patient reported working well for him.  Leaves tomorrow for international vacation x 1 month.  Patient reported he had not scheduled appt with Dr [place].  Denied palpitations at this time or chest pain/shortness of breath.  Has all of his other medications ready for travel.  Patient to follow up with PCM upon return for hypertension and recurrent palpitaitons.  Patient verbalized understanding information/instrucitons, agreed with plan of care and had no further questions at this time.

## 2017-06-18 ENCOUNTER — Ambulatory Visit: Payer: Self-pay | Admitting: Registered Nurse

## 2017-06-18 VITALS — BP 144/99 | HR 59 | Temp 97.9°F

## 2017-06-18 DIAGNOSIS — J0101 Acute recurrent maxillary sinusitis: Secondary | ICD-10-CM

## 2017-06-18 DIAGNOSIS — I10 Essential (primary) hypertension: Secondary | ICD-10-CM

## 2017-06-18 MED ORDER — AMOXICILLIN 875 MG PO TABS: 875.0000 mg | ORAL_TABLET | Freq: Two times a day (BID) | ORAL | 0 refills | Status: DC

## 2017-06-18 MED ORDER — FLUTICASONE PROPIONATE 50 MCG/ACT NA SUSP: 1.0000 | Freq: Two times a day (BID) | NASAL | 0 refills | Status: DC

## 2017-06-18 NOTE — Patient Instructions (Signed)
Schedule follow up with Dr Jabier Mutton for difficulty breathing with exertion consider PFTs (pulmonary function tests) Split flonase 1 spray each nostril twice a day Only take singulair (monteleukast) once a day 10mg  by mouth Start zyrtec 10mg  (cetirizine) by mouth daily OTC Amoxicillin 875mg  by mouth twice a day for 10 days  Shortness of Breath, Adult Shortness of breath is when a person has trouble breathing enough air, or when a person feels like she or he is having trouble breathing in enough air. Shortness of breath could be a sign of medical problem. Follow these instructions at home: Pay attention to any changes in your symptoms. Take these actions to help with your condition:  Do not smoke. Smoking is a common cause of shortness of breath. If you smoke and you need help quitting, ask your health care provider.  Avoid things that can irritate your airways, such as: ? Mold. ? Dust. ? Air pollution. ? Chemical fumes. ? Things that can cause allergy symptoms (allergens), if you have allergies.  Keep your living space clean and free of mold and dust.  Rest as needed. Slowly return to your usual activities.  Take over-the-counter and prescription medicines, including oxygen and inhaled medicines, only as told by your health care provider.  Keep all follow-up visits as told by your health care provider. This is important.  Contact a health care provider if:  Your condition does not improve as soon as expected.  You have a hard time doing your normal activities, even after you rest.  You have new symptoms. Get help right away if:  Your shortness of breath gets worse.  You have shortness of breath when you are resting.  You feel light-headed or you faint.  You have a cough that is not controlled with medicines.  You cough up blood.  You have pain with breathing.  You have pain in your chest, arms, shoulders, or abdomen.  You have a fever.  You cannot walk up stairs or  exercise the way that you normally do. This information is not intended to replace advice given to you by your health care provider. Make sure you discuss any questions you have with your health care provider. Document Released: 08/21/2001 Document Revised: 06/16/2016 Document Reviewed: 05/03/2016 Elsevier Interactive Patient Education  2018 10/21/2001.  Sinusitis, Adult Sinusitis is soreness and inflammation of your sinuses. Sinuses are hollow spaces in the bones around your face. Your sinuses are located:  Around your eyes.  In the middle of your forehead.  Behind your nose.  In your cheekbones.  Your sinuses and nasal passages are lined with a stringy fluid (mucus). Mucus normally drains out of your sinuses. When your nasal tissues become inflamed or swollen, the mucus can become trapped or blocked so air cannot flow through your sinuses. This allows bacteria, viruses, and funguses to grow, which leads to infection. Sinusitis can develop quickly and last for 7?10 days (acute) or for more than 12 weeks (chronic). Sinusitis often develops after a cold. What are the causes? This condition is caused by anything that creates swelling in the sinuses or stops mucus from draining, including:  Allergies.  Asthma.  Bacterial or viral infection.  Abnormally shaped bones between the nasal passages.  Nasal growths that contain mucus (nasal polyps).  Narrow sinus openings.  Pollutants, such as chemicals or irritants in the air.  A foreign object stuck in the nose.  A fungal infection. This is rare.  What increases the risk? The following factors  may make you more likely to develop this condition:  Having allergies or asthma.  Having had a recent cold or respiratory tract infection.  Having structural deformities or blockages in your nose or sinuses.  Having a weak immune system.  Doing a lot of swimming or diving.  Overusing nasal sprays.  Smoking.  What are the signs  or symptoms? The main symptoms of this condition are pain and a feeling of pressure around the affected sinuses. Other symptoms include:  Upper toothache.  Earache.  Headache.  Bad breath.  Decreased sense of smell and taste.  A cough that may get worse at night.  Fatigue.  Fever.  Thick drainage from your nose. The drainage is often green and it may contain pus (purulent).  Stuffy nose or congestion.  Postnasal drip. This is when extra mucus collects in the throat or back of the nose.  Swelling and warmth over the affected sinuses.  Sore throat.  Sensitivity to light.  How is this diagnosed? This condition is diagnosed based on symptoms, a medical history, and a physical exam. To find out if your condition is acute or chronic, your health care provider may:  Look in your nose for signs of nasal polyps.  Tap over the affected sinus to check for signs of infection.  View the inside of your sinuses using an imaging device that has a light attached (endoscope).  If your health care provider suspects that you have chronic sinusitis, you may also:  Be tested for allergies.  Have a sample of mucus taken from your nose (nasal culture) and checked for bacteria.  Have a mucus sample examined to see if your sinusitis is related to an allergy.  If your sinusitis does not respond to treatment and it lasts longer than 8 weeks, you may have an MRI or CT scan to check your sinuses. These scans also help to determine how severe your infection is. In rare cases, a bone biopsy may be done to rule out more serious types of fungal sinus disease. How is this treated? Treatment for sinusitis depends on the cause and whether your condition is chronic or acute. If a virus is causing your sinusitis, your symptoms will go away on their own within 10 days. You may be given medicines to relieve your symptoms, including:  Topical nasal decongestants. They shrink swollen nasal passages and let  mucus drain from your sinuses.  Antihistamines. These drugs block inflammation that is triggered by allergies. This can help to ease swelling in your nose and sinuses.  Topical nasal corticosteroids. These are nasal sprays that ease inflammation and swelling in your nose and sinuses.  Nasal saline washes. These rinses can help to get rid of thick mucus in your nose.  If your condition is caused by bacteria, you will be given an antibiotic medicine. If your condition is caused by a fungus, you will be given an antifungal medicine. Surgery may be needed to correct underlying conditions, such as narrow nasal passages. Surgery may also be needed to remove polyps. Follow these instructions at home: Medicines  Take, use, or apply over-the-counter and prescription medicines only as told by your health care provider. These may include nasal sprays.  If you were prescribed an antibiotic medicine, take it as told by your health care provider. Do not stop taking the antibiotic even if you start to feel better. Hydrate and Humidify  Drink enough water to keep your urine clear or pale yellow. Staying hydrated will help to  thin your mucus.  Use a cool mist humidifier to keep the humidity level in your home above 50%.  Inhale steam for 10-15 minutes, 3-4 times a day or as told by your health care provider. You can do this in the bathroom while a hot shower is running.  Limit your exposure to cool or dry air. Rest  Rest as much as possible.  Sleep with your head raised (elevated).  Make sure to get enough sleep each night. General instructions  Apply a warm, moist washcloth to your face 3-4 times a day or as told by your health care provider. This will help with discomfort.  Wash your hands often with soap and water to reduce your exposure to viruses and other germs. If soap and water are not available, use hand sanitizer.  Do not smoke. Avoid being around people who are smoking (secondhand  smoke).  Keep all follow-up visits as told by your health care provider. This is important. Contact a health care provider if:  You have a fever.  Your symptoms get worse.  Your symptoms do not improve within 10 days. Get help right away if:  You have a severe headache.  You have persistent vomiting.  You have pain or swelling around your face or eyes.  You have vision problems.  You develop confusion.  Your neck is stiff.  You have trouble breathing. This information is not intended to replace advice given to you by your health care provider. Make sure you discuss any questions you have with your health care provider. Document Released: 11/26/2005 Document Revised: 07/22/2016 Document Reviewed: 09/21/2015 Elsevier Interactive Patient Education  2017 [employer].

## 2017-06-18 NOTE — Progress Notes (Signed)
Subjective:    Patient ID: Ryan Glass, male    DOB: 03/21/1956, 61 y.o.   MRN: [id number]  61y/o hispanic married male established patient just returned from international vacation x 3 weeks to [place].  Pt reports maxillary and frontal sinus pain/pressure/HA x1 week. Productive cough with green phlegm.  Pain worst during descent airplane.  Has been taking flonase 2 sprays twice a day and singulair 1-2 tabs po BID due to am rhinitis.  Patient currently not taking any antihistamines.  Palpitations had resolved during vacation but still noticing dyspnea on exertion (SOB)  Ok at rest.  Has not seen Dr Lady Gary or [facility] in previous 2 months.  Due to palpitations prior to vacation had recommended patient see Dr Lady Gary (cardiology) and patient did not schedule appt.  Discussed with patient prior to vacation due to DOE consider PFTs as allergy history also. Patient reports also felt like he had a fever 3 days ago.      Review of Systems  Constitutional: Negative for activity change, appetite change, chills, diaphoresis, fatigue, fever and unexpected weight change.  HENT: Positive for congestion, postnasal drip, rhinorrhea, sinus pain and sinus pressure. Negative for dental problem, drooling, ear discharge, ear pain, facial swelling, hearing loss, mouth sores, nosebleeds, sneezing, sore throat, tinnitus, trouble swallowing and voice change.   Eyes: Negative for photophobia, pain, discharge, redness, itching and visual disturbance.  Respiratory: Positive for cough and shortness of breath. Negative for choking, chest tightness, wheezing and stridor.   Cardiovascular: Negative for chest pain, palpitations and leg swelling.  Gastrointestinal: Negative for abdominal distention, abdominal pain, blood in stool, constipation, diarrhea, nausea and vomiting.  Endocrine: Negative for cold intolerance and heat intolerance.  Genitourinary: Negative for dysuria.  Musculoskeletal: Negative for arthralgias, back pain, gait  problem, joint swelling, myalgias, neck pain and neck stiffness.  Skin: Negative for color change, pallor, rash and wound.  Allergic/Immunologic: Positive for environmental allergies. Negative for food allergies and immunocompromised state.  Neurological: Negative for dizziness, tremors, seizures, syncope, facial asymmetry, speech difficulty, weakness, light-headedness, numbness and headaches.  Hematological: Negative for adenopathy. Does not bruise/bleed easily.  Psychiatric/Behavioral: Negative for agitation, behavioral problems, confusion and sleep disturbance.       Objective:   Physical Exam  Constitutional: He is oriented to person, place, and time. Vital signs are normal. He appears well-developed and well-nourished. He is active and cooperative.  Non-toxic appearance. He does not have a sickly appearance. He appears ill. No distress.  HENT:  Head: Normocephalic and atraumatic.  Right Ear: External ear and ear canal normal. A middle ear effusion is present.  Left Ear: Hearing, external ear and ear canal normal. A middle ear effusion is present.  Nose: Mucosal edema and rhinorrhea present. No nose lacerations, sinus tenderness, nasal deformity, septal deviation or nasal septal hematoma. No epistaxis.  No foreign bodies. Right sinus exhibits maxillary sinus tenderness. Right sinus exhibits no frontal sinus tenderness. Left sinus exhibits maxillary sinus tenderness. Left sinus exhibits no frontal sinus tenderness.  Mouth/Throat: Uvula is midline and mucous membranes are normal. Mucous membranes are not pale, not dry and not cyanotic. He does not have dentures. No oral lesions. No trismus in the jaw. Normal dentition. No dental abscesses, uvula swelling, lacerations or dental caries. Posterior oropharyngeal edema and posterior oropharyngeal erythema present. No oropharyngeal exudate or tonsillar abscesses.  Hoarse voice; bilateral nasal turbinates with edema/erythema clear discharge;  cobblestoning posterior pharynx; bilateral TMs air fluid level clear; bilateral allergic shiners and nonpitting edema 1-2+/4  bilateral lower eyelids  Eyes: Conjunctivae, EOM and lids are normal. Pupils are equal, round, and reactive to light. Right eye exhibits no chemosis, no discharge, no exudate and no hordeolum. No foreign body present in the right eye. Left eye exhibits no chemosis, no discharge, no exudate and no hordeolum. No foreign body present in the left eye. Right conjunctiva is not injected. Right conjunctiva has no hemorrhage. Left conjunctiva is not injected. Left conjunctiva has no hemorrhage. No scleral icterus. Right eye exhibits normal extraocular motion and no nystagmus. Left eye exhibits normal extraocular motion and no nystagmus. Right pupil is round and reactive. Left pupil is round and reactive. Pupils are equal.  Neck: Trachea normal and normal range of motion. Neck supple. No tracheal tenderness, no spinous process tenderness and no muscular tenderness present. No neck rigidity. No tracheal deviation, no edema, no erythema and normal range of motion present. No thyroid mass and no thyromegaly present.  Cardiovascular: Normal rate, regular rhythm, S1 normal, S2 normal, normal heart sounds and intact distal pulses.  PMI is not displaced.  Exam reveals no gallop, no distant heart sounds and no friction rub.   No murmur heard. Pulses:      Radial pulses are 2+ on the right side, and 2+ on the left side.  Radial RRR  Pulmonary/Chest: Effort normal and breath sounds normal. No stridor. No respiratory distress. He has no decreased breath sounds. He has no wheezes. He has no rhonchi. He has no rales.  Abdominal: Soft. Normal appearance. He exhibits no distension, no fluid wave and no ascites. There is no guarding.  Musculoskeletal: Normal range of motion. He exhibits no edema or tenderness.       Right shoulder: Normal.       Left shoulder: Normal.       Right elbow: Normal.      Left  elbow: Normal.       Right hip: Normal.       Left hip: Normal.       Right knee: Normal.       Left knee: Normal.       Cervical back: Normal.       Right hand: Normal.       Left hand: Normal.  Lymphadenopathy:       Head (right side): No submental, no submandibular, no tonsillar, no preauricular, no posterior auricular and no occipital adenopathy present.       Head (left side): No submental, no submandibular, no tonsillar, no preauricular, no posterior auricular and no occipital adenopathy present.    He has no cervical adenopathy.       Right cervical: No superficial cervical, no deep cervical and no posterior cervical adenopathy present.      Left cervical: No superficial cervical, no deep cervical and no posterior cervical adenopathy present.  Neurological: He is alert and oriented to person, place, and time. He has normal strength. He displays no atrophy and no tremor. No cranial nerve deficit or sensory deficit. He exhibits normal muscle tone. He displays no seizure activity. Coordination and gait normal. GCS eye subscore is 4. GCS verbal subscore is 5. GCS motor subscore is 6.  On/off exam table; in/out of chair without difficulty; gait sure and steady hall  Skin: Skin is warm, dry and intact. No abrasion, no bruising, no burn, no ecchymosis, no laceration, no lesion, no petechiae and no rash noted. He is not diaphoretic. No cyanosis or erythema. No pallor. Nails show no clubbing.  Psychiatric: He  has a normal mood and affect. His speech is normal and behavior is normal. Judgment and thought content normal. Cognition and memory are normal.  Nursing note and vitals reviewed.         Assessment & Plan:  A-acute maxillary sinusitis recurrent; acute otitis media effusion bilaterally; hypertension  P-amoxicillin 875mg  po BID x 10 days #20 RF0 dispensed from [facility] start zyrtec 10mg  po daily OTC; singulair 10mg  po qhs only avoid multiple doses per day; flonase refilled 1 spray each  nostril BID #1 RF0 from [facility].  Continue nasal saline 2 sprays each nostril q2h wa OTC.  No evidence of systemic bacterial infection, non toxic and well hydrated.  I do not see where any further testing or imaging is necessary at this time.   I will suggest supportive care, rest, good hygiene and encourage the patient to take adequate fluids.  The patient is to return to clinic or EMERGENCY ROOM if symptoms worsen or change significantly.  Exitcare handout on sinusitis given to patient.  Patient verbalized agreement and understanding of treatment plan and had no further questions at this time.   P2:  Hand washing and cover cough   Patient may use normal saline nasal spray as needed.  Consider antihistamine or nasal steroid use.  Avoid triggers if possible.  Shower prior to bedtime if exposed to triggers.  If allergic dust/dust mites recommend mattress/pillow covers/encasements; washing linens, vacuuming, sweeping, dusting weekly.  Call or return to clinic as needed if these symptoms worsen or fail to improve as anticipated.   Exitcare handout on allergic rhinitis given to patient.  Patient verbalized understanding of instructions, agreed with plan of care and had no further questions at this time.  P2:  Avoidance and hand washing.  Supportive treatment.   No evidence of invasive bacterial infection, non toxic and well hydrated.  This is most likely self limiting viral infection.  I do not see where any further testing or imaging is necessary at this time.   I will suggest supportive care, rest, good hygiene and encourage the patient to take adequate fluids.  The patient is to return to clinic or EMERGENCY ROOM if symptoms worsen or change significantly e.g. ear pain, fever, purulent discharge from ears or bleeding.  Exitcare handout on otitis media with effusion given to patient.  Patient verbalized agreement and understanding of treatment plan.    Schedule follow up with PCM/Dr Fath consider PFTs regarding  DOE and noticed increased exertion with breathing.  Discussed with patient this could be cardiac and/or allergy related.  Continue current medications as directed.  Continue to monitor blood pressure at home and maintain log of blood pressure and pulse to bring to follow up appointments.  Continue low sodium diet and exercise program.  Recommended weight loss/weight maintenance to BMI 20-25.  Return to the clinic if any new symptoms.  Patient verbalized agreement and understanding of treatment plan and had no further questions at this time.   P2:  Diet and Exercise specific for HTN

## 2017-07-15 ENCOUNTER — Ambulatory Visit: Payer: Self-pay | Admitting: *Deleted

## 2017-07-15 VITALS — BP 160/95 | HR 76

## 2017-07-15 DIAGNOSIS — I10 Essential (primary) hypertension: Secondary | ICD-10-CM

## 2017-07-16 ENCOUNTER — Encounter: Payer: Self-pay | Admitting: Registered Nurse

## 2017-07-16 ENCOUNTER — Telehealth: Payer: Self-pay | Admitting: Registered Nurse

## 2017-07-16 NOTE — Telephone Encounter (Signed)
Patient requested bridge refill of medications as appt with cardiology Thursday afternoon Dr Lady Gary but will run out of his medications 8/10 and next available [facility] dispens[facility] clinic 14 Aug.  Palpitations and hypertension continue yesterday despite taking medications as prescribed saw RN Nance Pew in [place] clinic at worksite.  Patient given BP log to share with Dr Lady Gary  BP 07/15/2017 160/95 pulse 76 irregular irregular and patient instructed to contact cardiology by RN.  Patient denied palpitations, chest pain, SOB.  Filled from [facility] atenolol 25mg  po BID #60 Rf0; potassium chloride 2 tabs po BID #120 RF0; diclofenac 75mg  po BID #60 RF0; furoseminde 40mg  po daily #30 RF0 and amlodipine 10mg  po daily #30 RF0.

## 2017-08-20 ENCOUNTER — Encounter: Payer: Self-pay | Admitting: Registered Nurse

## 2017-08-20 ENCOUNTER — Telehealth: Payer: Self-pay | Admitting: Registered Nurse

## 2017-08-20 NOTE — Telephone Encounter (Signed)
Patient saw Dr Lady Gary 07/22/2017 and Dr Meredeth Ide for SOB 07/31/2017 for spirometry.  Has not seen PCM requires new Rx for refills at work pharmacy formulary.  Bridge refill given today 30 day supply potassium chloride take 2 tabs po BID #120 RF0; diclofenac sodium DR 75mg  po BID #60 RF0; amlodipine 10mg  po daily (patient taking 1/2 tab 5mg  po BID #30 RF0; furosemide 40mg  po daily #30 RF0 and atenolol 25mg  po BID #60 RF0  Dispensed from [facility] today to patient and he is to follow up with PCM/cardiology for new Rx.

## 2017-08-30 ENCOUNTER — Ambulatory Visit: Payer: Self-pay | Admitting: *Deleted

## 2017-08-30 VITALS — BP 141/100 | HR 65 | Ht 68.0 in | Wt 247.0 lb

## 2017-08-30 DIAGNOSIS — Z0000 Encounter for general adult medical examination without abnormal findings: Secondary | ICD-10-CM

## 2017-08-30 NOTE — Progress Notes (Signed)
[employer] insurance premium discount evaluation: Labs Drawn. [facility] ROI form signed. Tobacco Free Attestation form signed.  Forms placed in paper chart.  Okay to route to pcp and cardiology per pt.

## 2017-08-31 LAB — CMP12+LP+TP+TSH+6AC+PSA+CBC…
ALT: 30 IU/L (ref 0–44)
AST: 30 IU/L (ref 0–40)
Albumin/Globulin Ratio: 2.2 (ref 1.2–2.2)
Albumin: 4.8 g/dL (ref 3.6–4.8)
Alkaline Phosphatase: 82 IU/L (ref 39–117)
BUN/Creatinine Ratio: 16 (ref 10–24)
BUN: 18 mg/dL (ref 8–27)
Basophils Absolute: 0 10*3/uL (ref 0.0–0.2)
Basos: 1 %
Bilirubin Total: 0.9 mg/dL (ref 0.0–1.2)
Calcium: 9.5 mg/dL (ref 8.6–10.2)
Chloride: 102 mmol/L (ref 96–106)
Chol/HDL Ratio: 5.2 ratio — ABNORMAL HIGH (ref 0.0–5.0)
Cholesterol, Total: 182 mg/dL (ref 100–199)
Creatinine, Ser: 1.12 mg/dL (ref 0.76–1.27)
EOS (ABSOLUTE): 0.1 10*3/uL (ref 0.0–0.4)
Eos: 2 %
Estimated CHD Risk: 1.1 times avg. — ABNORMAL HIGH (ref 0.0–1.0)
Free Thyroxine Index: 1.9 (ref 1.2–4.9)
GFR calc Af Amer: 82 mL/min/{1.73_m2} (ref >59)
GFR calc non Af Amer: 71 mL/min/{1.73_m2} (ref >59)
GGT: 31 IU/L (ref 0–65)
Globulin, Total: 2.2 g/dL (ref 1.5–4.5)
Glucose: 83 mg/dL (ref 65–99)
HDL: 35 mg/dL — ABNORMAL LOW (ref >39)
Hematocrit: 43.8 % (ref 37.5–51.0)
Hemoglobin: 14.7 g/dL (ref 13.0–17.7)
Immature Grans (Abs): 0 10*3/uL (ref 0.0–0.1)
Immature Granulocytes: 0 %
Iron: 96 ug/dL (ref 38–169)
LDH: 218 IU/L (ref 121–224)
LDL Calculated: 119 mg/dL — ABNORMAL HIGH (ref 0–99)
Lymphocytes Absolute: 1.3 10*3/uL (ref 0.7–3.1)
Lymphs: 24 %
MCH: 31.1 pg (ref 26.6–33.0)
MCHC: 33.6 g/dL (ref 31.5–35.7)
MCV: 93 fL (ref 79–97)
Monocytes Absolute: 0.3 10*3/uL (ref 0.1–0.9)
Monocytes: 6 %
Neutrophils Absolute: 3.6 10*3/uL (ref 1.4–7.0)
Neutrophils: 67 %
Phosphorus: 3.7 mg/dL (ref 2.5–4.5)
Platelets: 199 10*3/uL (ref 150–379)
Potassium: 3.9 mmol/L (ref 3.5–5.2)
Prostate Specific Ag, Serum: 1.2 ng/mL (ref 0.0–4.0)
RBC: 4.73 x10E6/uL (ref 4.14–5.80)
RDW: 13.5 % (ref 12.3–15.4)
Sodium: 137 mmol/L (ref 134–144)
T3 Uptake Ratio: 26 % (ref 24–39)
T4, Total: 7.2 ug/dL (ref 4.5–12.0)
TSH: 2 u[IU]/mL (ref 0.450–4.500)
Total Protein: 7 g/dL (ref 6.0–8.5)
Triglycerides: 138 mg/dL (ref 0–149)
Uric Acid: 9 mg/dL — ABNORMAL HIGH (ref 3.7–8.6)
VLDL Cholesterol Cal: 28 mg/dL (ref 5–40)
WBC: 5.4 10*3/uL (ref 3.4–10.8)

## 2017-08-31 LAB — HGB A1C W/O EAG: Hgb A1c MFr Bld: 5.2 % (ref 4.8–5.6)

## 2017-09-02 NOTE — Progress Notes (Signed)
Results reviewed with pt. Low purine diet reviewed with pt. Denies active gout sx. LDL slightly improved from last year, still elevated. HDL worsened. BP elevated. Rechecked today 146/87. Diet and exercise recommendations for lipids, HTN, and wt management (BMI 37) discussed .Routine f/u with pcp and cardiology. Results routed to pcp and cardiology per pt request. Copy of labs provided to pt. No further questions/concerns.

## 2017-09-12 ENCOUNTER — Telehealth: Payer: Self-pay | Admitting: Registered Nurse

## 2017-09-12 ENCOUNTER — Encounter: Payer: Self-pay | Admitting: Registered Nurse

## 2017-09-12 MED ORDER — MONTELUKAST SODIUM 10 MG PO TABS: 10.0000 mg | ORAL_TABLET | Freq: Every day | ORAL | 3 refills | Status: DC

## 2017-09-12 NOTE — Telephone Encounter (Signed)
Patient requested Rx be sent to [facility] for singulair 10mg  po Qhs #90 RF3 as too expensive at $30 for one month supply.  New Rx entered for patient at [facility] and also discussed trying to see if Good Rx discount or cash price is cheaper at pharmacy versus using insurance.  Patient verbalized understanding information/instructions, agreed with plan of care and had no further questions at this time.

## 2017-09-23 ENCOUNTER — Ambulatory Visit: Payer: Self-pay | Admitting: *Deleted

## 2017-09-23 VITALS — BP 150/104

## 2017-09-23 DIAGNOSIS — Z23 Encounter for immunization: Secondary | ICD-10-CM

## 2017-09-23 NOTE — Progress Notes (Signed)
Last tetanus booster 9 years ago. Pt requesting booster today due to working with metal daily.

## 2017-10-03 ENCOUNTER — Telehealth: Payer: Self-pay | Admitting: Registered Nurse

## 2017-10-03 ENCOUNTER — Encounter: Payer: Self-pay | Admitting: Registered Nurse

## 2017-10-03 MED ORDER — FUROSEMIDE 40 MG PO TABS: 40.0000 mg | ORAL_TABLET | Freq: Every day | ORAL | 0 refills | Status: DC

## 2017-10-03 MED ORDER — AMLODIPINE BESYLATE 10 MG PO TABS: 10.0000 mg | ORAL_TABLET | Freq: Every day | ORAL | 0 refills | Status: DC

## 2017-10-03 MED ORDER — ATENOLOL 25 MG PO TABS: 25.0000 mg | ORAL_TABLET | Freq: Two times a day (BID) | ORAL | 0 refills | Status: DC

## 2017-10-03 MED ORDER — POTASSIUM CHLORIDE ER 20 MEQ PO TBCR: 2.0000 | EXTENDED_RELEASE_TABLET | Freq: Two times a day (BID) | ORAL | 0 refills | Status: DC

## 2017-10-03 NOTE — Telephone Encounter (Signed)
Bridge refill of patient work formulary medications.  He is to follow up with PCM/cardiology for renewal of his Rx for atenolol 25mg  po BID, furosemide 40mg  po daily; amlodipine 10mg  po daily and potassium chloride po BID.  Dispensed 30 day supply of each of these medications to patient from [facility] today.  Patient verbalized understanding of information/instructions, agreed with plan of care and had no furhter questions at this time.

## 2017-10-15 ENCOUNTER — Telehealth: Payer: Self-pay | Admitting: Registered Nurse

## 2017-10-15 ENCOUNTER — Encounter: Payer: Self-pay | Admitting: Registered Nurse

## 2017-10-15 MED ORDER — AMLODIPINE BESYLATE 10 MG PO TABS: 10.0000 mg | ORAL_TABLET | Freq: Every day | ORAL | 0 refills | Status: DC

## 2017-10-15 MED ORDER — DICLOFENAC SODIUM 75 MG PO TBEC
75.0000 mg | DELAYED_RELEASE_TABLET | Freq: Two times a day (BID) | ORAL | 0 refills | Status: DC
Start: 2017-10-15 — End: 2017-12-19

## 2017-10-15 NOTE — Telephone Encounter (Signed)
Dr Tresa Endo appt 28 Nov 18 at 1440  for sleep apnea.  Patient has started using breath strips on nose feeling better/breathing easier at night.  He called Dr America Brown office they would like him to do test for asthma but he is wondering why since the other test was normal.  Patient has not picked up singulair because [facility] was [facility] on Sunday when he went there.  Patient reported he called and spoke with Dr America Brown nurse and she was supposed to send magnesium Rx to [facility] and refills for amlodipine and diclofenac to [facility] fax [phone]  Contacted patient's other pharmacy [facility]) and they verified no new/refills for amlodipine or diclofenac received by their facility either.  Patient notified I would bridge refill him another month but we still require Rx for his medication fills routine/chronic.  Discussed that initial breathing test did not have methocholine challenge which tests for asthma.  The test he did ruled out COPD.  Patient verbalized understanding information/instructions, agreed with plan of care and had no further questions at this time.

## 2017-10-16 DIAGNOSIS — Z860100 Personal history of colon polyps, unspecified: Secondary | ICD-10-CM | POA: Insufficient documentation

## 2017-11-06 ENCOUNTER — Ambulatory Visit: Payer: Self-pay | Admitting: Cardiovascular Disease

## 2017-11-11 ENCOUNTER — Ambulatory Visit: Payer: Self-pay | Admitting: *Deleted

## 2017-11-11 VITALS — BP 164/98 | HR 46

## 2017-11-11 DIAGNOSIS — R42 Dizziness and giddiness: Secondary | ICD-10-CM

## 2017-11-11 NOTE — Progress Notes (Signed)
Pt reports dizziness with sudden onset last night in bed. Associated with sweating and feeling hot. Also had one episode diarrhea shortly after this. Some nausea, no vomiting. Tried to lie back down in bed but dizziness/room spinning persisted. Could not sleep. Went and sat in recliner and felt better but some dizziness persisted until this morning at work. He says it is now mostly resolved except if he bends forward, it recurs.  BP elevated today. Pulse irregular, low to mid 40's on average. Typically his pulse has been in mid 50's over past 2-3 months. He reports he took is normal dose of diuretic but that he had to urinate at least 8 times last night, which is much more than usual for him.  Sx could be r/t dehydration, low irregular pulse/high BP, or vertigo (no Hx of per pt). Due to pt's cardiac Hx, suggested he call pcp or cardiology or appt for today in order to have ekg and possibly labs drawn. Pt sts he will try but both offices are difficult to get in to same day. He sts since most sx have resolved now, he is also agreeable to seeing NP in clinic tomorrow if he is unable to be seen today. Pt has no further questions and leaves RN office ambulatory with steady gait.

## 2017-11-21 ENCOUNTER — Ambulatory Visit: Payer: Self-pay | Admitting: *Deleted

## 2017-11-21 VITALS — BP 134/81 | HR 40

## 2017-11-21 DIAGNOSIS — I499 Cardiac arrhythmia, unspecified: Secondary | ICD-10-CM

## 2017-11-21 DIAGNOSIS — I10 Essential (primary) hypertension: Secondary | ICD-10-CM

## 2017-11-21 DIAGNOSIS — J019 Acute sinusitis, unspecified: Secondary | ICD-10-CM

## 2017-11-21 NOTE — Progress Notes (Signed)
Pt reports cold sx. Runny nose, congestion. Ran out of Flonase a while ago so not currently using. Decongestant not offered 2/2 HTN Hx.  BP check today. BP well controlled. Best reading he has had in clinic since April this year. Feels well other than cold sx. Pulse irregular. Denies palpitations. Manual pulse high 40's/low 50's.  Seeing new cardiologist next month for second opinion. Had to reschedule previously arranged appt due to holiday demands at work.  Appt made with NP for tomorrow.

## 2017-11-22 ENCOUNTER — Ambulatory Visit: Payer: Self-pay | Admitting: Registered Nurse

## 2017-11-22 VITALS — BP 138/87 | HR 63 | Temp 98.2°F

## 2017-11-22 DIAGNOSIS — J0101 Acute recurrent maxillary sinusitis: Secondary | ICD-10-CM

## 2017-11-22 DIAGNOSIS — J209 Acute bronchitis, unspecified: Secondary | ICD-10-CM

## 2017-11-22 MED ORDER — SALINE SPRAY 0.65 % NA SOLN: 2.0000 | NASAL | 0 refills | Status: DC

## 2017-11-22 MED ORDER — FLUTICASONE PROPIONATE 50 MCG/ACT NA SUSP
1.0000 | Freq: Two times a day (BID) | NASAL | 0 refills | Status: DC
Start: 2017-11-22 — End: 2018-01-14

## 2017-11-22 MED ORDER — PREDNISONE 10 MG PO TABS: 20.0000 mg | ORAL_TABLET | Freq: Every day | ORAL | 0 refills | Status: DC

## 2017-11-22 MED ORDER — AMOXICILLIN-POT CLAVULANATE 875-125 MG PO TABS: 1.0000 | ORAL_TABLET | Freq: Two times a day (BID) | ORAL | 0 refills | Status: DC

## 2017-11-22 NOTE — Progress Notes (Signed)
Subjective:    Patient ID: Ryan Glass, male    DOB: [birth year], 61 y.o.   MRN: [id number]  61y/o married hispanic male established Pt c/o productive cough, rhinorrhea, chest congestion, sore throat. Denies otalgia. Using nasal saline spray.  Has not picked up his Rx for singulair at [facility] as line too long every time he has gone to get it.  Needs refill on his flonase ran out.  Other coworkers sick with cough and runny nose.  PCM/cardiology appt pending for his hypertension.  Wondering if he should get refill on diclofenac ran out also; he is waiting to discuss with his PCM as knee/back pain intermittent worsened with cold weather.      Review of Systems  Constitutional: Negative for activity change, appetite change, chills, diaphoresis, fatigue, fever and unexpected weight change.  HENT: Positive for congestion, postnasal drip, rhinorrhea, sinus pressure, sinus pain and sore throat. Negative for dental problem, drooling, ear discharge, ear pain, facial swelling, hearing loss, mouth sores, nosebleeds, sneezing, tinnitus, trouble swallowing and voice change.   Eyes: Negative for photophobia, pain, discharge, redness, itching and visual disturbance.  Respiratory: Positive for cough. Negative for choking, chest tightness, shortness of breath, wheezing and stridor.   Cardiovascular: Negative for chest pain, palpitations and leg swelling.  Gastrointestinal: Negative for abdominal distention, abdominal pain, blood in stool, constipation, diarrhea, nausea and vomiting.  Endocrine: Negative for cold intolerance and heat intolerance.  Genitourinary: Negative for dysuria.  Musculoskeletal: Positive for arthralgias. Negative for back pain, gait problem, joint swelling, myalgias, neck pain and neck stiffness.  Skin: Negative for color change, pallor, rash and wound.  Allergic/Immunologic: Positive for environmental allergies. Negative for food allergies and immunocompromised state.  Neurological:  Negative for dizziness, tremors, seizures, syncope, facial asymmetry, speech difficulty, weakness, light-headedness, numbness and headaches.  Hematological: Negative for adenopathy. Does not bruise/bleed easily.  Psychiatric/Behavioral: Negative for agitation, behavioral problems, confusion and sleep disturbance.       Objective:   Physical Exam  Constitutional: He is oriented to person, place, and time. Vital signs are normal. He appears well-developed and well-nourished. He is active and cooperative.  Non-toxic appearance. He does not have a sickly appearance. He appears ill. No distress.  HENT:  Head: Normocephalic and atraumatic.  Right Ear: Hearing, external ear and ear canal normal. A middle ear effusion is present.  Left Ear: Hearing, external ear and ear canal normal. A middle ear effusion is present.  Nose: Mucosal edema and rhinorrhea present. No nose lacerations, sinus tenderness, nasal deformity, septal deviation or nasal septal hematoma. No epistaxis.  No foreign bodies. Right sinus exhibits maxillary sinus tenderness. Right sinus exhibits no frontal sinus tenderness. Left sinus exhibits maxillary sinus tenderness. Left sinus exhibits no frontal sinus tenderness.  Mouth/Throat: Uvula is midline and mucous membranes are normal. Mucous membranes are not pale, not dry and not cyanotic. He does not have dentures. No oral lesions. No trismus in the jaw. Normal dentition. No dental abscesses, uvula swelling, lacerations or dental caries. Posterior oropharyngeal edema and posterior oropharyngeal erythema present. No oropharyngeal exudate or tonsillar abscesses.  Cobblestoning posterior pharynx;  Bilateral TMs air fluid level clear; bilateral allergic shiners; bilateral nasal turbinates edema/erythema clear discharge; nasal congestion/frequent clearing noted in exam room  Eyes: Conjunctivae, EOM and lids are normal. Pupils are equal, round, and reactive to light. Right eye exhibits no chemosis,  no discharge, no exudate and no hordeolum. No foreign body present in the right eye. Left eye exhibits no chemosis, no discharge,  no exudate and no hordeolum. No foreign body present in the left eye. Right conjunctiva is not injected. Right conjunctiva has no hemorrhage. Left conjunctiva is not injected. Left conjunctiva has no hemorrhage. No scleral icterus. Right eye exhibits normal extraocular motion and no nystagmus. Left eye exhibits normal extraocular motion and no nystagmus. Right pupil is round and reactive. Left pupil is round and reactive. Pupils are equal.  Neck: Trachea normal, normal range of motion and phonation normal. Neck supple. No tracheal tenderness and no muscular tenderness present. No neck rigidity. No tracheal deviation, no edema, no erythema and normal range of motion present. No thyroid mass and no thyromegaly present.  Cardiovascular: Normal rate, regular rhythm, S1 normal, S2 normal, normal heart sounds and intact distal pulses. PMI is not displaced. Exam reveals no gallop and no friction rub.  No murmur heard. Pulmonary/Chest: Effort normal. No accessory muscle usage or stridor. No respiratory distress. He has no decreased breath sounds. He has wheezes in the right middle field. He has no rhonchi. He has no rales.  Fine expiratory wheeze RMF; no cough observed in exam room; spoke full sentences without difficulty  Abdominal: Soft. Normal appearance. He exhibits no distension, no fluid wave and no ascites. There is no rigidity and no guarding.  Musculoskeletal: Normal range of motion. He exhibits no edema or tenderness.       Right shoulder: Normal.       Left shoulder: Normal.       Right elbow: Normal.      Left elbow: Normal.       Right hip: Normal.       Left hip: Normal.       Cervical back: Normal.       Thoracic back: Normal.       Right hand: Normal.       Left hand: Normal.  Lymphadenopathy:       Head (right side): No submental, no submandibular, no  tonsillar, no preauricular, no posterior auricular and no occipital adenopathy present.       Head (left side): No submental, no submandibular, no tonsillar, no preauricular, no posterior auricular and no occipital adenopathy present.    He has no cervical adenopathy.       Right cervical: No superficial cervical, no deep cervical and no posterior cervical adenopathy present.      Left cervical: No superficial cervical, no deep cervical and no posterior cervical adenopathy present.  Neurological: He is alert and oriented to person, place, and time. He has normal strength. He displays no atrophy and no tremor. No cranial nerve deficit or sensory deficit. He exhibits normal muscle tone. He displays no seizure activity. Coordination and gait normal. GCS eye subscore is 4. GCS verbal subscore is 5. GCS motor subscore is 6.  On/off exam table; in/out chair without difficulty; gait sure and steady in hallway  Skin: Skin is warm, dry and intact. No abrasion, no bruising, no burn, no ecchymosis, no laceration, no lesion, no petechiae and no rash noted. He is not diaphoretic. No cyanosis or erythema. No pallor. Nails show no clubbing.  Psychiatric: He has a normal mood and affect. His speech is normal and behavior is normal. Judgment and thought content normal. Cognition and memory are normal.  Nursing note and vitals reviewed.         Assessment & Plan:  A-acute recurrent maxillary sinusitis and acute bronchitis  P-Restart flonase 1 spray each nostril BID #1 RF0 dispensed from [facility] to patient, saline  2 sprays each nostril q2h wa prn congestion 1 bottle given from clinic stock  If no improvement with 48 hours of saline and flonase use start augmentin  po BID x 10 days #20 RF0 dispensed from [facility].  Discussed with patient hypokalemia possible medication interaction with his chronic medications and augmentin.  Continue his potassium supplement and eat banana/potato daily if he takes augmentin and return  to clinic if he has symptoms of low potassium.  Electronic Rx given.  Denied personal or family history of ENT cancer.  Shower BID especially prior to bed. No evidence of systemic bacterial infection, non toxic and well hydrated.  I do not see where any further testing or imaging is necessary at this time.   I will suggest supportive care, rest, good hygiene and encourage the patient to take adequate fluids.  The patient is to return to clinic or EMERGENCY ROOM if symptoms worsen or change significantly.  Exitcare handout on sinusitis and sinus rinse given to patient.  Patient verbalized agreement and understanding of treatment plan and had no further questions at this time.   P2:  Hand washing and cover cough   Discussed to use sunscreen/protective clothing as increased risk of sunburn and most common side effect stomach upset take medication with food. Cough lozenges po q2h prn cough given 8 UD from clinic stock.  Prednisone  po daily with breakfast #21 RF0 dispensed from [facility].  Discussed possible side effects increased/decreased appetite, difficulty sleeping, increased blood sugar, increased blood pressure and heart rate.  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   Bronchitis simple, community acquired, may have started as viral (probably respiratory syncytial, parainfluenza, influenza, or adenovirus), but now evidence of acute purulent bronchitis with resultant bronchial edema and mucus formation.  Viruses are the most common cause of bronchial inflammation in otherwise healthy adults with acute bronchitis.  The appearance of sputum is not predictive of whether a bacterial infection is present.  Purulent sputum is most often caused by viral infections.  There are a small portion of those caused by non-viral agents being Mycoplama pneumonia.  Microscopic examination or C&S of sputum in the healthy adult with acute bronchitis is generally not  helpful (usually negative or normal respiratory flora) other considerations being cough from upper respiratory tract infections, sinusitis or allergic syndromes (mild asthma or viral pneumonia).  Differential Diagnoses:  reactive airway disease (asthma, allergic aspergillosis (eosinophilia), chronic bronchitis, respiratory infection (sinusitis, common cold, pneumonia), congestive heart failure, reflux esophagitis, bronchogenic tumor, aspiration syndromes and/or exposure to pulmonary irritants/smoke. Without high fever, severe dyspnea, lack of physical findings or other risk factors, I will hold on a chest radiograph and CBC at this time.  I discussed that approximately 50% of patients with acute bronchitis have a cough that lasts up to three weeks, and 25% for over a month.  Tylenol  one to two tablets every four to six hours as needed for fever or myalgias.  No aspirin. Exitcare handout on bronchitis given to patient.  ER if hemopthysis, SOB, worst chest pain of life.   Patient instructed to follow up in one week or sooner if symptoms worsen.  Patient verbalized agreement and understanding of treatment plan.  P2:  hand washing and cover cough  Patient reported he has plenty of his chronic medications and did not need refills at this time.  Will see PCM within the next month to obtain new Rx for his chronic medications  and fills with [employer] formulary.

## 2017-11-23 NOTE — Patient Instructions (Signed)
Sinusitis, Adult Sinusitis is soreness and inflammation of your sinuses. Sinuses are hollow spaces in the bones around your face. Your sinuses are located:  Around your eyes.  In the middle of your forehead.  Behind your nose.  In your cheekbones.  Your sinuses and nasal passages are lined with a stringy fluid (mucus). Mucus normally drains out of your sinuses. When your nasal tissues become inflamed or swollen, the mucus can become trapped or blocked so air cannot flow through your sinuses. This allows bacteria, viruses, and funguses to grow, which leads to infection. Sinusitis can develop quickly and last for 7?10 days (acute) or for more than 12 weeks (chronic). Sinusitis often develops after a cold. What are the causes? This condition is caused by anything that creates swelling in the sinuses or stops mucus from draining, including:  Allergies.  Asthma.  Bacterial or viral infection.  Abnormally shaped bones between the nasal passages.  Nasal growths that contain mucus (nasal polyps).  Narrow sinus openings.  Pollutants, such as chemicals or irritants in the air.  A foreign object stuck in the nose.  A fungal infection. This is rare.  What increases the risk? The following factors may make you more likely to develop this condition:  Having allergies or asthma.  Having had a recent cold or respiratory tract infection.  Having structural deformities or blockages in your nose or sinuses.  Having a weak immune system.  Doing a lot of swimming or diving.  Overusing nasal sprays.  Smoking.  What are the signs or symptoms? The main symptoms of this condition are pain and a feeling of pressure around the affected sinuses. Other symptoms include:  Upper toothache.  Earache.  Headache.  Bad breath.  Decreased sense of smell and taste.  A cough that may get worse at night.  Fatigue.  Fever.  Thick drainage from your nose. The drainage is often green and  it may contain pus (purulent).  Stuffy nose or congestion.  Postnasal drip. This is when extra mucus collects in the throat or back of the nose.  Swelling and warmth over the affected sinuses.  Sore throat.  Sensitivity to light.  How is this diagnosed? This condition is diagnosed based on symptoms, a medical history, and a physical exam. To find out if your condition is acute or chronic, your health care provider may:  Look in your nose for signs of nasal polyps.  Tap over the affected sinus to check for signs of infection.  View the inside of your sinuses using an imaging device that has a light attached (endoscope).  If your health care provider suspects that you have chronic sinusitis, you may also:  Be tested for allergies.  Have a sample of mucus taken from your nose (nasal culture) and checked for bacteria.  Have a mucus sample examined to see if your sinusitis is related to an allergy.  If your sinusitis does not respond to treatment and it lasts longer than 8 weeks, you may have an MRI or CT scan to check your sinuses. These scans also help to determine how severe your infection is. In rare cases, a bone biopsy may be done to rule out more serious types of fungal sinus disease. How is this treated? Treatment for sinusitis depends on the cause and whether your condition is chronic or acute. If a virus is causing your sinusitis, your symptoms will go away on their own within 10 days. You may be given medicines to relieve your symptoms,   including:  Topical nasal decongestants. They shrink swollen nasal passages and let mucus drain from your sinuses.  Antihistamines. These drugs block inflammation that is triggered by allergies. This can help to ease swelling in your nose and sinuses.  Topical nasal corticosteroids. These are nasal sprays that ease inflammation and swelling in your nose and sinuses.  Nasal saline washes. These rinses can help to get rid of thick mucus in  your nose.  If your condition is caused by bacteria, you will be given an antibiotic medicine. If your condition is caused by a fungus, you will be given an antifungal medicine. Surgery may be needed to correct underlying conditions, such as narrow nasal passages. Surgery may also be needed to remove polyps. Follow these instructions at home: Medicines  Take, use, or apply over-the-counter and prescription medicines only as told by your health care provider. These may include nasal sprays.  If you were prescribed an antibiotic medicine, take it as told by your health care provider. Do not stop taking the antibiotic even if you start to feel better. Hydrate and Humidify  Drink enough water to keep your urine clear or pale yellow. Staying hydrated will help to thin your mucus.  Use a cool mist humidifier to keep the humidity level in your home above 50%.  Inhale steam for 10-15 minutes, 3-4 times a day or as told by your health care provider. You can do this in the bathroom while a hot shower is running.  Limit your exposure to cool or dry air. Rest  Rest as much as possible.  Sleep with your head raised (elevated).  Make sure to get enough sleep each night. General instructions  Apply a warm, moist washcloth to your face 3-4 times a day or as told by your health care provider. This will help with discomfort.  Wash your hands often with soap and water to reduce your exposure to viruses and other germs. If soap and water are not available, use hand sanitizer.  Do not smoke. Avoid being around people who are smoking (secondhand smoke).  Keep all follow-up visits as told by your health care provider. This is important. Contact a health care provider if:  You have a fever.  Your symptoms get worse.  Your symptoms do not improve within 10 days. Get help right away if:  You have a severe headache.  You have persistent vomiting.  You have pain or swelling around your face or  eyes.  You have vision problems.  You develop confusion.  Your neck is stiff.  You have trouble breathing. This information is not intended to replace advice given to you by your health care provider. Make sure you discuss any questions you have with your health care provider. Document Released: 11/26/2005 Document Revised: 07/22/2016 Document Reviewed: 09/21/2015 Elsevier Interactive Patient Education  2017 [employer] Acute Bronchitis, Adult Acute bronchitis is sudden (acute) swelling of the air tubes (bronchi) in the lungs. Acute bronchitis causes these tubes to fill with mucus, which can make it hard to breathe. It can also cause coughing or wheezing. In adults, acute bronchitis usually goes away within 2 weeks. A cough caused by bronchitis may last up to 3 weeks. Smoking, allergies, and asthma can make the condition worse. Repeated episodes of bronchitis may cause further lung problems, such as chronic obstructive pulmonary disease (COPD). What are the causes? This condition can be caused by germs and by substances that irritate the lungs, including:  Cold and flu viruses. This condition  is most often caused by the same virus that causes a cold.  Bacteria.  Exposure to tobacco smoke, dust, fumes, and air pollution.  What increases the risk? This condition is more likely to develop in people who:  Have close contact with someone with acute bronchitis.  Are exposed to lung irritants, such as tobacco smoke, dust, fumes, and vapors.  Have a weak immune system.  Have a respiratory condition such as asthma.  What are the signs or symptoms? Symptoms of this condition include:  A cough.  Coughing up clear, yellow, or green mucus.  Wheezing.  Chest congestion.  Shortness of breath.  A fever.  Body aches.  Chills.  A sore throat.  How is this diagnosed? This condition is usually diagnosed with a physical exam. During the exam, your health care provider may order  tests, such as chest X-rays, to rule out other conditions. He or she may also:  Test a sample of your mucus for bacterial infection.  Check the level of oxygen in your blood. This is done to check for pneumonia.  Do a chest X-ray or lung function testing to rule out pneumonia and other conditions.  Perform blood tests.  Your health care provider will also ask about your symptoms and medical history. How is this treated? Most cases of acute bronchitis clear up over time without treatment. Your health care provider may recommend:  Drinking more fluids. Drinking more makes your mucus thinner, which may make it easier to breathe.  Taking a medicine for a fever or cough.  Taking an antibiotic medicine.  Using an inhaler to help improve shortness of breath and to control a cough.  Using a cool mist vaporizer or humidifier to make it easier to breathe.  Follow these instructions at home: Medicines  Take over-the-counter and prescription medicines only as told by your health care provider.  If you were prescribed an antibiotic, take it as told by your health care provider. Do not stop taking the antibiotic even if you start to feel better. General instructions  Get plenty of rest.  Drink enough fluids to keep your urine clear or pale yellow.  Avoid smoking and secondhand smoke. Exposure to cigarette smoke or irritating chemicals will make bronchitis worse. If you smoke and you need help quitting, ask your health care provider. Quitting smoking will help your lungs heal faster.  Use an inhaler, cool mist vaporizer, or humidifier as told by your health care provider.  Keep all follow-up visits as told by your health care provider. This is important. How is this prevented? To lower your risk of getting this condition again:  Wash your hands often with soap and water. If soap and water are not available, use hand sanitizer.  Avoid contact with people who have cold symptoms.  Try  not to touch your hands to your mouth, nose, or eyes.  Make sure to get the flu shot every year.  Contact a health care provider if:  Your symptoms do not improve in 2 weeks of treatment. Get help right away if:  You cough up blood.  You have chest pain.  You have severe shortness of breath.  You become dehydrated.  You faint or keep feeling like you are going to faint.  You keep vomiting.  You have a severe headache.  Your fever or chills gets worse. This information is not intended to replace advice given to you by your health care provider. Make sure you discuss any questions you have  with your health care provider. Document Released: 01/03/2005 Document Revised: 06/20/2016 Document Reviewed: 05/16/2016 Elsevier Interactive Patient Education  2017 [employer] Sinus Rinse What is a sinus rinse? A sinus rinse is a simple home treatment that is used to rinse your sinuses with a sterile mixture of salt and water (saline solution). Sinuses are air-filled spaces in your skull behind the bones of your face and forehead that open into your nasal cavity. You will use the following:  Saline solution.  Neti pot or spray bottle. This releases the saline solution into your nose and through your sinuses. Neti pots and spray bottles can be purchased at [employer], a health food store, or online.  When would I do a sinus rinse? A sinus rinse can help to clear mucus, dirt, dust, or pollen from the nasal cavity. You may do a sinus rinse when you have a cold, a virus, nasal allergy symptoms, a sinus infection, or stuffiness in the nose or sinuses. If you are considering a sinus rinse:  Ask your child's health care provider before performing a sinus rinse on your child.  Do not do a sinus rinse if you have had ear or nasal surgery, ear infection, or blocked ears.  How do I do a sinus rinse?  Wash your hands.  Disinfect your device according to the directions provided and  then dry it.  Use the solution that comes with your device or one that is sold separately in stores. Follow the mixing directions on the package.  Fill your device with the amount of saline solution as directed by the device instructions.  Stand over a sink and tilt your head sideways over the sink.  Place the spout of the device in your upper nostril (the one closer to the ceiling).  Gently pour or squeeze the saline solution into the nasal cavity. The liquid should drain to the lower nostril if you are not overly congested.  Gently blow your nose. Blowing too hard may cause ear pain.  Repeat in the other nostril.  Clean and rinse your device with clean water and then air-dry it. Are there risks of a sinus rinse? Sinus rinse is generally very safe and effective. However, there are a few risks, which include:  A burning sensation in the sinuses. This may happen if you do not make the saline solution as directed. Make sure to follow all directions when making the saline solution.  Infection from contaminated water. This is rare, but possible.  Nasal irritation.  This information is not intended to replace advice given to you by your health care provider. Make sure you discuss any questions you have with your health care provider. Document Released: 06/23/2014 Document Revised: 10/23/2016 Document Reviewed: 04/13/2014 Elsevier Interactive Patient Education  2017 [employer].

## 2017-12-19 ENCOUNTER — Telehealth: Payer: Self-pay | Admitting: Registered Nurse

## 2017-12-19 ENCOUNTER — Encounter: Payer: Self-pay | Admitting: Registered Nurse

## 2017-12-19 MED ORDER — DICLOFENAC SODIUM 75 MG PO TBEC: 75.0000 mg | DELAYED_RELEASE_TABLET | Freq: Two times a day (BID) | ORAL | 0 refills | Status: DC

## 2017-12-19 NOTE — Telephone Encounter (Signed)
Patient requested refill of diclofenac 75mg  po BID as cold weather flaring his arthritis and cannot see PCM until next month.  Bridge refill #60 RF0 given from [facility] to patient.  His international travel also delayed until Feb to Mar.  Patient reported he has been checking his blood pressure at home and it is much better readings than in clinic.  He will bring in BP machine and log for [place] to review.  He wants to know if he can stop his diuretic because he is urinating for 3 hours constantly in the morning after he takes his pill.  Encouraged patient to continue medication based on his previous clinic blood pressure readings and discussed with him will re-evlauate with Dr [place] and his home BP log.  Patient verbalized understanding information/instructions, agreed with plan of care and had no further questions at this time.

## 2018-01-14 ENCOUNTER — Telehealth: Payer: Self-pay | Admitting: *Deleted

## 2018-01-14 MED ORDER — FLUTICASONE PROPIONATE 50 MCG/ACT NA SUSP: 1.0000 | Freq: Two times a day (BID) | NASAL | 6 refills | Status: DC

## 2018-01-14 NOTE — Telephone Encounter (Signed)
Refilled flonase 1 spray each nostril BID #1 RF6 for patient electronic Rx to pharmacy of his choice.  Patient next appt with PCM 20 Jan 2018 and he is leaving for [place] x 2 weeks 21 Jan 2018.  Patient given 30 day supply of his medications that we carry on formulary today from [facility] atenolol 25mg  po BID #60 RF0, potassium chloride 2 tabs po BID #120 RF0, furosemide 40mg  po daily #30 RF0 and amlodipine 10mg  po daily #30 RF0  Patient verified he is taking medication as prescribed and is expecting PCM to draw labs at this appt.  Patient has been using a machine at home to monitor his blood pressure twice a day.  Instructed patient to bring his machine and log to [facility] appt 20 Jan 2018.  Discussed again with patient weight loss and exercise 150 minutes per day.  Patient taking his blood pressure machine with him on vacation to monitor blood pressure to see if decreases when he doesn't have work stress.  Patient denied palpitations, muscle cramps, fatigue, headache or chest pain.  Patient did state he feels a lot of stress at work and he does think that is raising his blood pressure at work.  Patient still pending scheduling his cardiology follow up with Dr .  Patient is filling his other BP medications/allergy medications at a [facility] because we do not carry them at the [facility] formulary.

## 2018-01-23 ENCOUNTER — Telehealth: Payer: Self-pay | Admitting: *Deleted

## 2018-01-23 NOTE — Telephone Encounter (Signed)
REFERRAL SENT SCHEDULING.

## 2018-02-13 ENCOUNTER — Ambulatory Visit: Payer: Self-pay | Admitting: *Deleted

## 2018-02-13 VITALS — BP 149/88 | HR 44

## 2018-02-13 DIAGNOSIS — I10 Essential (primary) hypertension: Secondary | ICD-10-CM

## 2018-02-13 DIAGNOSIS — I499 Cardiac arrhythmia, unspecified: Secondary | ICD-10-CM

## 2018-02-13 DIAGNOSIS — Z0130 Encounter for examination of blood pressure without abnormal findings: Secondary | ICD-10-CM

## 2018-02-13 NOTE — Progress Notes (Signed)
Requests BP check. Feels well. Asymptomatic. Manual pulse check-irregularly irregular as previous.   Pt voices excitement for upcoming new cardiologist appt next month. Sts he is ready to get his BP more under control and that the MD he is seeing can also manage sleep apnea so he can get a new study or mask if needed.   Pt also concerned he is taking too much Lasix as he feels he has to urinate q15-73min. No obvious signs of dehydration noted as mucous membranes moist, no skin tenting, no tachycardia, no hypotension. Reassured pt that the medication is working well, does not appear to be too high of a dose, and the urinating is the typical action of Lasix, but encouraged him to discuss this at his cardio appt next month.   Also reminded him to inform the MD that they can view our notes from clinic in Epic regarding his BP, irregular pulse, medication changes and fills, and any other communication they may want to have with [place].   He has no further questions or concerns. Recent fill of Diclofenac given to pt with explanation that pcp only sent 2 months worth of fill with Rx and to call in one month for additional refills if he wishes to continue this into the springtime as weather warms up.

## 2018-02-28 ENCOUNTER — Ambulatory Visit: Payer: Self-pay | Admitting: *Deleted

## 2018-02-28 VITALS — BP 144/87 | HR 56

## 2018-02-28 DIAGNOSIS — I10 Essential (primary) hypertension: Secondary | ICD-10-CM

## 2018-02-28 DIAGNOSIS — Z0130 Encounter for examination of blood pressure without abnormal findings: Secondary | ICD-10-CM

## 2018-03-05 ENCOUNTER — Encounter: Payer: Self-pay | Admitting: Registered Nurse

## 2018-03-05 ENCOUNTER — Telehealth: Payer: Self-pay | Admitting: Registered Nurse

## 2018-03-05 MED ORDER — FUROSEMIDE 40 MG PO TABS: 40.0000 mg | ORAL_TABLET | Freq: Every day | ORAL | 0 refills | Status: DC

## 2018-03-05 MED ORDER — AMLODIPINE BESYLATE 10 MG PO TABS: 10.0000 mg | ORAL_TABLET | Freq: Every day | ORAL | 0 refills | Status: DC

## 2018-03-05 MED ORDER — ATENOLOL 25 MG PO TABS: 25.0000 mg | ORAL_TABLET | Freq: Two times a day (BID) | ORAL | 0 refills | Status: DC

## 2018-03-05 MED ORDER — POTASSIUM CHLORIDE ER 20 MEQ PO TBCR: 2.0000 | EXTENDED_RELEASE_TABLET | Freq: Two times a day (BID) | ORAL | 0 refills | Status: DC

## 2018-03-05 NOTE — Telephone Encounter (Signed)
Patient returned from vacation and cardiology appt still pending next month and he will run out of his [facility] formulary medications prior to that time.  Potassium Chloride take 2 tabs po BID #240 RF0, amlodipine 10mg  po daily #90 RF0, lasix 40mg  po daily #60 RF0, atenolol 25mg  po BID #120 RF0 filled from [facility] for patient.  Patient to bring new Rx to clinic after his cardiology appt next month for next fill.  Patient to repeat BP with RN upon medication pickup and will consider CMP as last potassium Sep 2018 WNL but typically lower when tested in spring in the past.  Will discuss with patient at appt tomorrow he scheduled to assess elbow pain.

## 2018-03-06 ENCOUNTER — Ambulatory Visit: Payer: Self-pay | Admitting: Registered Nurse

## 2018-03-06 VITALS — BP 148/86 | HR 55 | Temp 98.0°F

## 2018-03-06 DIAGNOSIS — R2230 Localized swelling, mass and lump, unspecified upper limb: Secondary | ICD-10-CM

## 2018-03-06 DIAGNOSIS — Z8639 Personal history of other endocrine, nutritional and metabolic disease: Secondary | ICD-10-CM

## 2018-03-06 DIAGNOSIS — M7022 Olecranon bursitis, left elbow: Secondary | ICD-10-CM

## 2018-03-06 NOTE — Patient Instructions (Addendum)
Elbow Bursitis Rehab Ask your health care provider which exercises are safe for you. Do exercises exactly as told by your health care provider and adjust them as directed. It is normal to feel mild stretching, pulling, tightness, or discomfort as you do these exercises, but you should stop right away if you feel sudden pain or your pain gets worse. Do not begin these exercises until told by your health care provider. Stretching and range of motion exercises These exercises warm up your muscles and joints and improve the movement and flexibility of your elbow. These exercises also help to relieve pain and swelling. Exercise A: Passive elbow flexion  1. Lie on your back. 2. Extend your left / right arm up into the air, bracing it with your other hand. Allow your left / right arm to relax. 3. Let your left / right elbow bend, allowing your hand to fall slowly toward your chest. You should feel a gentle stretch along the back of your upper arm and elbow. 4. If told by your health care provider, hold a ____none______ hand weight to increase the intensity of this stretch. 5. Hold this position for ___15_______ seconds. 6. Slowly return your left / right arm to the upright position. Repeat _____3_____ times. Complete this exercise ____2______ times a day. Exercise B: Passive elbow extension  1. Lie on your back on a firm bed. Make sure that you are in a comfortable position that allows you relax your arm muscles. 2. Place a folded towel under your left / right upper arm so that your elbow and shoulder are at the same height. 3. Extend your left / right arm so your elbow and hand do not rest on the bed or towel. 4. Let the weight of your hand straighten your elbow. Keep your arm and chest muscles relaxed. You should feel a stretch on the inside of your elbow. 5. If told by your health care provider, increase the intensity of your stretch by adding a small wrist weight or hand weight. 6. Hold this  position for ____15______ seconds. 7. Slowly return to the starting position. Repeat _____3_____ times. Complete this exercise _____2_____ times a day. Strengthening exercises These exercises build strength and endurance in your elbow. Endurance is the ability to use your muscles for a long time, even after they get tired. Exercise C: Elbow extensors, isometric  1. Stand or sit upright on a firm surface. 2. Place your left / right arm so your palm faces your abdomen, at the height of your waist. 3. Place your other hand on the underside of your forearm. Slowly push your left / right arm down, like you were going to straighten your elbow. Resist with your other hand. Push as hard as you can without causing any pain or movement at your left / right elbow. 4. Hold this position for ____15______ seconds. 5. Slowly release the tension in both arms. 6. Let your muscles relax completely before repeating. Repeat _____3_____ times. Complete this exercise ____2______ times a day. Exercise D: Elbow flexors, isometric 1. Stand or sit upright on a firm surface. 2. Place your left / right arm so your hand is palm-up, at the height of your waist. 3. Place your other hand on top of your forearm. Gently push up with your left / right arm, like you were going to bend your elbow. Resist this motion with your other hand. Push as hard as you can without causing any pain or movement at your left / right  elbow. 4. Hold this position for ____15______ seconds. 5. Slowly release the tension in both arms. 6. Let your muscles relax completely before repeating. Repeat _____3_____ times. Complete this exercise ______2____ times a day. This information is not intended to replace advice given to you by your health care provider. Make sure you discuss any questions you have with your health care provider. Document Released: 11/26/2005 Document Revised: 07/31/2016 Document Reviewed: 08/25/2015 Elsevier Interactive Patient  Education  2018 Elsevier Inc. Elbow Bursitis Elbow bursitis is inflammation of the fluid-filled sac (bursa) between the tip of your elbow bone (olecranon) and your skin. Elbow bursitis may also be called olecranon bursitis. Normally, the olecranon bursa has only a small amount of fluid in it to cushion and protect your elbow bone. Elbow bursitis causes fluid to build up inside the bursa. Over time, this swelling and inflammation can cause pain when you bend or lean on your elbow. What are the causes? Elbow bursitis may be caused by:  Elbow injury (acute trauma).  Leaning on hard surfaces for long periods of time.  Infection from an injury that breaks the skin near your elbow.  A bone growth (spur) that forms at the tip of your elbow.  A medical condition that causes inflammation in your body, such as gout or rheumatoid arthritis.  The cause may also be unknown. What are the signs or symptoms? The first sign of elbow bursitis is usually swelling over the tip of your elbow. This can grow to be the size of a golf ball. This may start suddenly or develop gradually. You may also have:  Pain when bending or leaning on your elbow.  Restricted movement of your elbow.  If your bursitis is caused by an infection, symptoms may also include:  Redness, warmth, and tenderness of the elbow.  Drainage of pus from the swollen area over your elbow, if the skin breaks open.  How is this diagnosed? Your health care provider may be able to diagnose elbow bursitis based on your signs and symptoms, especially if you have recently been injured. Your health care provider will also do a physical exam. This may include:  X-rays to look for a bone spur or a bone fracture.  Draining fluid from the bursa to test it for infection.  Blood tests to rule out gout or rheumatoid arthritis.  How is this treated? Treatment for elbow bursitis depends on the cause. Treatment may include:  Medicines. These may  include: ? Over-the-counter medicines to relieve pain and inflammation. ? Antibiotic medicines to fight infection. ? Injections of anti-inflammatory medicines (steroids).  Wrapping your elbow with a bandage.  Draining fluid from the bursa.  Wearing elbow pads.  If your bursitis does not get better with treatment, surgery may be needed to remove the bursa. Follow these instructions at home:  Take medicines only as directed by your health care provider.  If you were prescribed an antibiotic medicine, finish all of it even if you start to feel better.  If your bursitis is caused by an injury, rest your elbow and wear your bandage as directed by your health care provider. You may alsoapply ice to the injured area as directed by your health care provider: ? Put ice in a plastic bag. ? Place a towel between your skin and the bag. ? Leave the ice on for 20 minutes, 2-3 times per day.  Avoid any activities that cause elbow pain.  Use elbow pads or elbow wraps to cushion your elbow. Contact  a health care provider if:  You have a fever.  Your symptoms do not get better with treatment.  Your pain or swelling gets worse.  Your elbow pain or swelling goes away and then returns.  You have drainage of pus from the swollen area over your elbow. This information is not intended to replace advice given to you by your health care provider. Make sure you discuss any questions you have with your health care provider. Document Released: 12/26/2006 Document Revised: 05/03/2016 Document Reviewed: 08/04/2014 Elsevier Interactive Patient Education  2018 Elsevier Inc.  Cisto sinovial (Ganglion Cyst) Um cisto sinovial  um caroo no canceroso preenchido com fluido que ocorre prximo a articulaes ou tendes. Cistos sinoviais crescem em articulaes ou no revestimento de tendes. Eles se desenvolvem mais frequentemente na mo ou no pulso, mas tambm podem se desenvolver no ombro, cotovelo, quadril,  joelho, tornozelo ou p. Os cistos sinoviais redondos ou ovais podem ser do tamanho de uma ervilha ou maiores que [place]. Atividade intensa pode aumentar o tamanho do cisto porque mais fluido comea a se Counselling psychologist. CAUSAS No se sabe o que causa o crescimento de cistos sinoviais. Contudo, eles podem estar relacionados a:  Inflamao ou irritao em torno [employer].  Leses.  Movimentos repetitivos ou uso [employer].  Artrite. FATORES DE RISCO Os fatores de risco incluem:  Ser [employer].  Idade entre 20 e 50 anos. SINAIS E SINTOMAS Os sintomas podem incluir:  Um caroo. Os caroos aparecem mais frequentemente na mo ou no pulso, mas podem ocorrer em [employer] do corpo.  Formigamento.  Dor.  Dormncia.  Fraqueza muscular.  Pegada fraca.  Reduo do Ambulance person. DIAGNSTICO Cistos sinoviais so diagnosticados mais frequentemente com base em um exame fsico. Seu mdico sentir o caroo e poder ilumin-lo. Se ele foi um cisto sinovial, a luz ir atravess-lo. Seu mdico poder solicitar uma radiografia, ultrassonografia ou ressonncia magntica para descartar outros quadros clnicos. TRATAMENTO Cistos sinoviais em geral desaparecem por conta prpria sem tratamento. Em caso de dor ou outros sintomas, tratamento pode ser necessrio. O tratamento tambm ser necessrio caso o cisto sinovial limite seu movimento ou seja infectado. O tratamento pode incluir:  Usar uma rtese ou tala no pulso ou dedo.  Tomar medicamentos anti-inflamatrios.  Drenar o fluido do caroo com uma agulha (aspirao).  Injetar um esteroide na articulao.  Cirurgia para remover o cisto sinovial. INSTRUES PARA TRATAMENTO DOMICILIAR  No pressione o cisto sinovial, nem o espete com agulha ou golpeie.  Tome medicamentos somente conforme orientado por seu mdico.  Use a rtese ou tala conforme orientado por seu mdico.  Observe se ocorrem alteraes em seu cisto  sinovial.  Comparea a todas as consultas de acompanhamento conforme orientado por seu mdico. Isso  importante. PROCURE UM MDICO SE:  Seu cisto sinovial aumentar de tamanho ou ficar mais dolorido.  Ocorrer aumento da vermelhido, listras vermelhas ou inchao.  Pus sair do caroo.  Sentir fraqueza ou dormncia na rea afetada.  Tiver febre ou calafrios. Estas informaes no se destinam a substituir as recomendaes de seu mdico. No deixe de discutir quaisquer dvidas com seu mdico. Document Released: 09/05/2005 Document Revised: 12/17/2014 Document Reviewed: 05/11/2014 Elsevier Interactive Patient Education  2018 Elsevier Inc. Hipocalemia (Hypokalemia) Hipocalemia significa que a quantidade de potssio no sangue  mais baixa que a normal. O potssio  um composto qumico, chamado eletrlito, que auxilia na regulao da quantidade de lquido no corpo. Ele tambm estimula a contrao muscular e ajuda os nervos  funcionarem apropriadamente. A maior parte do potssio do corpo est [employer], e apenas uma pequena quantidade est no sangue. Porque a quantidade no sangue  to pequena, alteraes mnimas podem causar risco de vida. CAUSAS  Antibiticos.  Diarreia e vmitos.  Usar muitos laxantes, o que pode causar diarreia.  Doena renal crnica.  Plulas de gua (diurticos).  Transtornos alimentares (bulimia).  Baixo nvel de magnsio.  Sudorese excessiva. SINAIS E SINTOMAS  Mickel Duhamel.  Constipao.  Fadiga.  Cibras musculares.  Confuso mental.  Batimentos cardacos ignorados ou irregulares (palpitaes).  Formigamento ou dormncia. DIAGNSTICO Seu mdico pode diagnosticar hipocalemia com exames de sangue. Alm de checar seu nvel de potssio, seu mdico pode tambm checar outros testes laboratoriais. TRATAMENTO A hipocalemia pode ser tratada com suplementos de potssio tomados por via oral ou com ajustes feitos na sua medicao atual. Se o seu nvel  de potssio estiver muito baixo, voc pode precisar obter potssio por via intravenosa (IV) e ser monitorado no hospital. Uma dieta rica em potssio tambm  til. Alimentos ricos em potssio so:  Nozes, como amendoins e pistaches.  Sementes, como sementes de [place] e sementes de abbora.  Ervilhas, lentilhas e feijo.  [place] e cereais integrais e pes de farelo.  Frutas e vegetais frescos, como damascos, abacate, bananas, cantalupo, kiwi, laranjas, tomates, aspargo e batatas.  Sucos de Art therapist.  Carnes vermelhas.  Iogurte de fruta. INSTRUES PARA TRATAMENTO DOMICILIAR  Tome todos os medicamentos conforme receitado pelo seu mdico.  Mantenha uma dieta saudvel pela incluso de alimentos nutritivos, como frutas, vegetais, nozes, gros integrais e carnes [place].  Se voc estiver tomando laxante, assegure-se de seguir as orientaes do rtulo. PROCURE UM MDICO SE:  Sua fraqueza piorar.  Sentir seu corao batendo forte ou acelerado.  Vomitar ou tiver diarreia.  For diabtico e tiver problema em manter a glicose sangunea no nvel normal. PROCURE UM MDICO IMEDIATAMENTE SE:  Sentir dor no peito, falta de ar ou tontura.  Vomitar ou tiver diarreia por mais de 2 dias.  Desmaiar. CERTIFIQUE-SE DE:  Compreender estas instrues.  Observar as suas condies.  Procurar um mdico imediatamente se no se sentir bem ou piorar. Estas informaes no se destinam a substituir as recomendaes de seu mdico. No deixe de discutir quaisquer dvidas com seu mdico. Document Released: 11/26/2005 Document Revised: 12/17/2014 Document Reviewed: 06/06/2016 Elsevier Interactive Patient Education  2018 Elsevier Inc.  Ganglion Cyst A ganglion cyst is a noncancerous, fluid-filled lump that occurs near joints or tendons. The ganglion cyst grows out of a joint or the lining of a tendon. It most often develops in the hand or wrist, but it can also develop in the shoulder, elbow,  hip, knee, ankle, or foot. The round or oval ganglion cyst can be the size of a pea or larger than a grape. Increased activity may enlarge the size of the cyst because more fluid starts to build up. What are the causes? It is not known what causes a ganglion cyst to grow. However, it may be related to:  Inflammation or irritation around the joint.  An injury.  Repetitive movements or overuse.  Arthritis.  What increases the risk? Risk factors include:  Being a woman.  Being age 10-50.  What are the signs or symptoms? Symptoms may include:  A lump. This most often appears on the hand or wrist, but it can occur in other areas of the body.  Tingling.  Pain.  Numbness.  Muscle weakness.  Weak grip.  Less  movement in a joint.  How is this diagnosed? Ganglion cysts are most often diagnosed based on a physical exam. Your health care provider will feel the lump and may shine a light alongside it. If it is a ganglion cyst, a light often shines through it. Your health care provider may order an X-ray, ultrasound, or MRI to rule out other conditions. How is this treated? Ganglion cysts usually go away on their own without treatment. If pain or other symptoms are involved, treatment may be needed. Treatment is also needed if the ganglion cyst limits your movement or if it gets infected. Treatment may include:  Wearing a brace or splint on your wrist or finger.  Taking anti-inflammatory medicine.  Draining fluid from the lump with a needle (aspiration).  Injecting a steroid into the joint.  Surgery to remove the ganglion cyst.  Follow these instructions at home:  Do not press on the ganglion cyst, poke it with a needle, or hit it.  Take medicines only as directed by your health care provider.  Wear your brace or splint as directed by your health care provider.  Watch your ganglion cyst for any changes.  Keep all follow-up visits as directed by your health care  provider. This is important. Contact a health care provider if:  Your ganglion cyst becomes larger or more painful.  You have increased redness, red streaks, or swelling.  You have pus coming from the lump.  You have weakness or numbness in the affected area.  You have a fever or chills. This information is not intended to replace advice given to you by your health care provider. Make sure you discuss any questions you have with your health care provider. Document Released: 11/23/2000 Document Revised: 05/03/2016 Document Reviewed: 05/11/2014 Elsevier Interactive Patient Education  2018 [employer].  Hypokalemia Hypokalemia means that the amount of potassium in the blood is lower than normal.Potassium is a chemical that helps regulate the amount of fluid in the body (electrolyte). It also stimulates muscle tightening (contraction) and helps nerves work properly.Normally, most of the body's potassium is inside of cells, and only a very small amount is in the blood. Because the amount in the blood is so small, minor changes to potassium levels in the blood can be life-threatening. What are the causes? This condition may be caused by:  Antibiotic medicine.  Diarrhea or vomiting. Taking too much of a medicine that helps you have a bowel movement (laxative) can cause diarrhea and lead to hypokalemia.  Chronic kidney disease (CKD).  Medicines that help the body get rid of excess fluid (diuretics).  Eating disorders, such as bulimia.  Low magnesium levels in the body.  Sweating a lot.  What are the signs or symptoms? Symptoms of this condition include:  Weakness.  Constipation.  Fatigue.  Muscle cramps.  Mental confusion.  Skipped heartbeats or irregular heartbeat (palpitations).  Tingling or numbness.  How is this diagnosed? This condition is diagnosed with a blood test. How is this treated? Hypokalemia can be treated by taking potassium supplements by mouth or  adjusting the medicines that you take. Treatment may also include eating more foods that contain a lot of potassium. If your potassium level is very low, you may need to get potassium through an IV tube in one of your veins and be monitored in the hospital. Follow these instructions at home:  Take over-the-counter and prescription medicines only as told by your health care provider. This includes vitamins and supplements.  Eat  a healthy diet. A healthy diet includes fresh fruits and vegetables, whole grains, healthy fats, and lean proteins.  If instructed, eat more foods that contain a lot of potassium, such as: ? Nuts, such as peanuts and pistachios. ? Seeds, such as sunflower seeds and pumpkin seeds. ? Peas, lentils, and lima beans. ? Whole grain and bran cereals and breads. ? Fresh fruits and vegetables, such as apricots, avocado, bananas, cantaloupe, kiwi, oranges, tomatoes, asparagus, and potatoes. ? Orange juice. ? Tomato juice. ? Red meats. ? Yogurt.  Keep all follow-up visits as told by your health care provider. This is important. Contact a health care provider if:  You have weakness that gets worse.  You feel your heart pounding or racing.  You vomit.  You have diarrhea.  You have diabetes (diabetes mellitus) and you have trouble keeping your blood sugar (glucose) in your target range. Get help right away if:  You have chest pain.  You have shortness of breath.  You have vomiting or diarrhea that lasts for more than 2 days.  You faint. This information is not intended to replace advice given to you by your health care provider. Make sure you discuss any questions you have with your health care provider. Document Released: 11/26/2005 Document Revised: 07/14/2016 Document Reviewed: 07/14/2016 Elsevier Interactive Patient Education  2018 [employer].

## 2018-03-06 NOTE — Progress Notes (Signed)
Subjective:    Patient ID: Ryan Glass, male    DOB: 03/21/1956, 62 y.o.   MRN: [id number]  61y/o married Hispanic established male patient here for left elbow swelling and pain.  Pain worst with pressure on the elbow leaning on his workstation at work.  Right hand dominant.  PMHx gout, arthritis  Has not been using his diclofenac topical, has been trying to cut down on oral NSAIDS, tylenol.  Recently on vacation to [place].  Has cardiology follow up next month.  Saw PCM in past two months was not having problems at that time.  Denied fever/chills/headache/arm weakness/tingling, rash, bruising or falls.  Patient reports he had labs in February 2019 to check his potassium.  Intermittent palpitations continue no worsening or resolution chronic and has seen cardiology in the past year for this complaint.  Denied chest pain, shortness of breath, swelling in hands/feet, headaches and/or visual changes.     Review of Systems  Constitutional: Negative for activity change, appetite change, chills, diaphoresis, fatigue and fever.  HENT: Negative for trouble swallowing and voice change.   Eyes: Negative for photophobia and visual disturbance.  Respiratory: Negative for cough, chest tightness and shortness of breath.   Cardiovascular: Positive for palpitations. Negative for chest pain and leg swelling.  Gastrointestinal: Negative for diarrhea, nausea and vomiting.  Endocrine: Negative for cold intolerance and heat intolerance.  Genitourinary: Negative for difficulty urinating.  Musculoskeletal: Positive for arthralgias and joint swelling. Negative for gait problem.  Skin: Negative for color change, pallor, rash and wound.  Allergic/Immunologic: Positive for environmental allergies. Negative for food allergies.  Neurological: Negative for dizziness, tremors, seizures, syncope, speech difficulty, weakness, light-headedness, numbness and headaches.  Hematological: Negative for adenopathy. Does not  bruise/bleed easily.  Psychiatric/Behavioral: Negative for agitation, confusion and sleep disturbance.       Objective:   Physical Exam  Constitutional: He is oriented to person, place, and time. Vital signs are normal. He appears well-developed and well-nourished. He is active and cooperative.  Non-toxic appearance. He does not have a sickly appearance. He does not appear ill. No distress.  HENT:  Head: Normocephalic and atraumatic.  Right Ear: Hearing and external ear normal.  Left Ear: Hearing and external ear normal.  Nose: Nose normal.  Mouth/Throat: Uvula is midline, oropharynx is clear and moist and mucous membranes are normal. No oropharyngeal exudate.  Eyes: Pupils are equal, round, and reactive to light. Conjunctivae, EOM and lids are normal. Right eye exhibits no discharge. Left eye exhibits no discharge. No scleral icterus.  Neck: Trachea normal, normal range of motion and phonation normal. Neck supple. No spinous process tenderness and no muscular tenderness present. No neck rigidity. No tracheal deviation, no edema, no erythema and normal range of motion present.  Cardiovascular: Normal rate, regular rhythm, S1 normal, S2 normal, normal heart sounds and intact distal pulses. Exam reveals no gallop, no distant heart sounds and no friction rub.  No murmur heard. Pulses:      Radial pulses are 2+ on the right side, and 2+ on the left side.  Pulmonary/Chest: Effort normal and breath sounds normal. No accessory muscle usage or stridor. No respiratory distress. He has no decreased breath sounds. He has no wheezes. He has no rhonchi. He has no rales.  No cough observed in exam room; spoke full sentences without difficulty  Abdominal: Soft. Normal appearance. He exhibits no distension, no fluid wave and no ascites. There is no rigidity and no guarding.  Musculoskeletal: Normal range of  motion. He exhibits edema and tenderness. He exhibits no deformity.       Right shoulder: Normal.        Left shoulder: Normal.       Right elbow: Normal.      Left elbow: He exhibits swelling. He exhibits normal range of motion, no effusion, no deformity and no laceration. Tenderness found. Olecranon process tenderness noted. No radial head, no medial epicondyle and no lateral epicondyle tenderness noted.       Right wrist: Normal.       Left wrist: Normal.       Right hip: Normal.       Left hip: Normal.       Right knee: Normal.       Left knee: Normal.       Right upper arm: Normal.       Left upper arm: Normal.       Right forearm: Normal.       Left forearm: Normal.       Arms: Olecranon bursa 1-2+/4 nonpitting edema "puffy" slightly tender; full AROM bilateral shoulders/wrists/elbows equal; strength 5/5 bilateral hands; negative neers/atchley scratch/empty can bilaterally; no macular erythema/bruising/abrasions; 2nd MCP nodule1cm mobile with flexion 2nd digit dorsum nontender firm wellcircumscribed no erythema or fluctuance  Lymphadenopathy:    He has no cervical adenopathy.       Right cervical: No superficial cervical, no deep cervical and no posterior cervical adenopathy present.      Left cervical: No superficial cervical, no deep cervical and no posterior cervical adenopathy present.  Neurological: He is alert and oriented to person, place, and time. He has normal strength. He is not disoriented. He displays no atrophy and no tremor. No cranial nerve deficit or sensory deficit. He exhibits normal muscle tone. He displays no seizure activity. Coordination and gait normal. GCS eye subscore is 4. GCS verbal subscore is 5. GCS motor subscore is 6.  Bilateral hand grasp/upper extremity strength equal 5/5; on/off exam table; in/out of chair without difficulty; gait sure and steady in hallway  Skin: Skin is warm, dry and intact. No abrasion, no bruising, no burn, no ecchymosis, no laceration, no lesion, no petechiae and no rash noted. He is not diaphoretic. No cyanosis or erythema. No  pallor. Nails show no clubbing.  Psychiatric: He has a normal mood and affect. His speech is normal and behavior is normal. Judgment and thought content normal. Cognition and memory are normal.          Assessment & Plan:  A-left olecranon bursitis and nodule 2nd MCP joint, hypokalemia hx  P-Avoid direct pressure, massaging left elbow.  Cryotherapy 15 minutes TID, may apply biofreeze 1% QID given 4 UD from clinic stock.  Has diclofenac oral and topical at home.  May apply topical QID or take oral BID.  Avoid impact to left elbow.  Gentle AROM and stetching daily QID.  Exitcare handout on olecranon bursitis printed and given to patient.  DDx gout, arthritis consider imaging and orthopedic consult if no improvement with plan of care.  Patient verbalized understanding information/instructions, agreed with plan of care and had no further questions at this time.  Hypokalemia had labs [facility] in Feb 2019 normal.  Continue KDUR 2 tabs po BID as ordered.  If palpitations worsening increased frequency, weakness, dizzyness, nausea/vomiting, fatigue, muscle spasms follow up for re-evaluation  Keep follow up with cardiology as scheduled next month.  Patient verbalized understanding information/instructions, agreed with plan of care  and had no further questions at this time.  Probable ganglion cyst MCP joint.  Discussed imaging, orthopedics referral/possible surgical removal/biopsy.  Patient requested to hold at this time.  Trial cryotherapy, diclofenac, biofreeze.  Exitcare handout on ganglion cyst.  Patient verbalized understanding information/instructions, agreed with plan of care and had no further questions at this time.

## 2018-03-20 ENCOUNTER — Ambulatory Visit: Payer: Self-pay | Admitting: Registered Nurse

## 2018-03-20 VITALS — BP 139/78 | HR 47 | Temp 98.9°F

## 2018-03-20 DIAGNOSIS — R600 Localized edema: Secondary | ICD-10-CM

## 2018-03-20 DIAGNOSIS — R0789 Other chest pain: Secondary | ICD-10-CM

## 2018-03-20 NOTE — Progress Notes (Signed)
Subjective:    Patient ID: Ryan Glass, male    DOB: 1956/07/04, 62 y.o.   MRN: [id number]  62y/o married [place] male established Pt reports sharp, shooting pain that started last night that occurs in L chest that radiates down L arm. Occurred 3 times last night and 3 times so far today. Will last approx 30-45 seconds then spontaneously resolve.  Denies any assoc sx such as ShOB, diaphoresis, palpitations, etc. Denies feeling irregular HR currently or during episodes, but he has not been able to feel these as often as he previously could approx 2-3 months ago anyway Per RN Nance Pew Manual pulse 48-51 bpm, irregularly irregular during triage. Patient reported he started corn silk oral supplement 2 days ago to help with his gout per recommendation of coworker.  He also restarted singulair last night for his allergies.  He had two cups of coffee this morning.  Denied recent illness, nausea/vomiting/diarrhea/headache/fever/chills/visual changes/dyspnea/dysphagia/dysphasia/swelling in his hands/feet/dysuria or rashes.     Review of Systems  HENT: Positive for postnasal drip and rhinorrhea. Negative for nosebleeds, sinus pressure, sinus pain, tinnitus, trouble swallowing and voice change.   Eyes: Negative for photophobia and visual disturbance.  Respiratory: Negative for cough, choking, chest tightness, shortness of breath, wheezing and stridor.   Cardiovascular: Positive for chest pain and palpitations. Negative for leg swelling.  Gastrointestinal: Positive for blood in stool. Negative for abdominal pain, diarrhea, nausea and vomiting.  Endocrine: Negative for cold intolerance and heat intolerance.  Genitourinary: Negative for difficulty urinating and dysuria.  Musculoskeletal: Negative for gait problem, neck pain and neck stiffness.  Skin: Negative for color change, rash and wound.  Allergic/Immunologic: Positive for environmental allergies. Negative for food allergies.  Neurological:  Negative for dizziness, tremors, seizures, syncope, facial asymmetry, speech difficulty, weakness, light-headedness, numbness and headaches.  Psychiatric/Behavioral: Negative for agitation, confusion and sleep disturbance.       Objective:   Physical Exam  Constitutional: He is oriented to person, place, and time. He appears well-developed and well-nourished. He is active and cooperative.  Non-toxic appearance. He does not have a sickly appearance. He does not appear ill. No distress.  HENT:  Head: Normocephalic and atraumatic.  Right Ear: Hearing and external ear normal.  Left Ear: Hearing and external ear normal.  Nose: Nose normal. No mucosal edema, rhinorrhea, nose lacerations, sinus tenderness, nasal deformity, septal deviation or nasal septal hematoma. No epistaxis.  No foreign bodies. Right sinus exhibits no maxillary sinus tenderness and no frontal sinus tenderness. Left sinus exhibits no maxillary sinus tenderness and no frontal sinus tenderness.  Mouth/Throat: Uvula is midline and mucous membranes are normal. Mucous membranes are not pale, not dry and not cyanotic. No oral lesions. No trismus in the jaw. No dental abscesses or uvula swelling. Posterior oropharyngeal edema and posterior oropharyngeal erythema present. No oropharyngeal exudate or tonsillar abscesses.  Eyes: Pupils are equal, round, and reactive to light. Conjunctivae, EOM and lids are normal. Right eye exhibits no discharge. Left eye exhibits no discharge. No scleral icterus.  Neck: Trachea normal, normal range of motion and phonation normal. Neck supple. No muscular tenderness present. No neck rigidity. No tracheal deviation, no edema, no erythema and normal range of motion present.  Cardiovascular: S1 normal, S2 normal, normal heart sounds and intact distal pulses. A regularly irregular rhythm present. Bradycardia present. Exam reveals no gallop, no distant heart sounds and no friction rub.  No murmur heard. Pulses:       Radial pulses are 2+ on the  right side, and 2+ on the left side.  3 regular than 1 irregular beat on pulse radial and apical in exam room; when walking pulse increases to 60s but when stops walking returns to 40s; bilateral lower legs/ankles 1+/4 pitting edema  Pulmonary/Chest: Effort normal and breath sounds normal. No accessory muscle usage or stridor. No respiratory distress. He has no decreased breath sounds. He has no wheezes. He has no rhonchi. He has no rales.  No cough observed in exam room; spoke full sentences without difficulty  Abdominal: Soft. Normal appearance. He exhibits no distension, no fluid wave and no ascites. Bowel sounds are decreased. There is no tenderness. There is no rigidity and no guarding.  Musculoskeletal: Normal range of motion. He exhibits edema. He exhibits no tenderness or deformity.       Right shoulder: Normal.       Left shoulder: Normal.       Right elbow: Normal.      Left elbow: Normal.       Right hip: Normal.       Left hip: Normal.       Right knee: Normal.       Left knee: Normal.       Right ankle: He exhibits swelling. He exhibits normal range of motion, no ecchymosis, no deformity, no laceration and normal pulse. No tenderness. Achilles tendon normal.       Left ankle: He exhibits swelling. He exhibits normal range of motion, no ecchymosis, no deformity, no laceration and normal pulse. No tenderness. Achilles tendon normal.       Cervical back: Normal.       Thoracic back: Normal.       Lumbar back: Normal.       Right hand: Normal.       Left hand: Normal.       Right lower leg: He exhibits edema. He exhibits no tenderness, no bony tenderness, no swelling, no deformity and no laceration.       Left lower leg: He exhibits edema. He exhibits no tenderness, no bony tenderness, no swelling, no deformity and no laceration.  On/off exam table; in/out of chair without difficulty; bilateral upper and lower extremities strength 5/5 equal; gait sure and  steady in hallway normal heel toe gait  Lymphadenopathy:       Head (right side): No submental, no submandibular, no tonsillar, no preauricular, no posterior auricular and no occipital adenopathy present.       Head (left side): No submental, no submandibular, no tonsillar, no preauricular, no posterior auricular and no occipital adenopathy present.    He has no cervical adenopathy.       Right cervical: No superficial cervical, no deep cervical and no posterior cervical adenopathy present.      Left cervical: No superficial cervical, no deep cervical and no posterior cervical adenopathy present.  Neurological: He is alert and oriented to person, place, and time. He has normal strength. He is not disoriented. He displays no atrophy and no tremor. No cranial nerve deficit or sensory deficit. He exhibits normal muscle tone. He displays no seizure activity. Coordination and gait normal. GCS eye subscore is 4. GCS verbal subscore is 5. GCS motor subscore is 6.  Skin: Skin is warm, dry and intact. No abrasion, no bruising, no burn, no ecchymosis, no petechiae and no rash noted. He is not diaphoretic. No cyanosis or erythema. No pallor. Nails show no clubbing.  Psychiatric: He has a normal mood and affect. His  speech is normal and behavior is normal. Judgment and thought content normal. Cognition and memory are normal.  Nursing note and vitals reviewed.    50 ft hallway walking pulse in 60s denied dizzyness/palpipations/sob/cp; no diaphoresis/sob/dyspnea/wheezing During my exam 3:1 pattern to HR     Assessment & Plan:  A-palpitations and chest pain, seasonal allergic rhinitis  P-Drink gatorade or powerade now low sugar formulation.  Stop corn silk supplement discussed with patient it is a diuretic and probably lowered his electrolyte levels.  Take all his medications as prescribed today  If chest pain returns, worsens in frequency or duration to go to ER/call 911.  Discussed no EKG available at this  clinic [facility].  Must call PCM/cardiology or go to [facility] for EKG and highly encouraged if changes in palpation frequency/duration or chest pain returns especially if accompanied by SOB/diaphoresis/vomiting/visual changes/weakness extremities/dysphagia/dysphasia.  Denied chest pain at time of exam or patient feeling palptiations  Has cardiology appt still pending end of the month keep as scheduled. Notified RN Rolly Salter to draw BMP plus magnesium level if palpitations later today /tomorrow.  Contact 911/EMS for patient transfer if signs/symptoms stroke or recurrence of chest pain and no resolution spontaneously.  Exitcare handout on chest pain, hypokalemia/hypomagnesemia/palptiations.  Patient with history of frequent PVCs on EKG/holter in past year with cardiology along with baseline 0-1+ pitting edema bilateral lower extremities.  Today is consistent with his baseline.  Patient denied DOE any time today or chest pain at rest or with walking at time of appt.  On potassium supplements due to diuretics.  Started herb diuretic on his own this week.  Restarted singulair due to seasonal allergies less likely to be cause of chest pain but discussed with patient if no resolution with stopping corn silk consider trial of stopping singulair also to see if symptoms resolve.  Rest, hydrate alternating gatorade/powerade and water.  Avoid caffeine.  Patient verbalized understanding information/instructions, agreed with plan of care and had no further questions at this time.  Patient may use normal saline nasal spray 2 sprays each nostril q2h wa as needed. flonase 1 spray each nostril BID singulair 10mg  po qhs.  Patient denied personal or family history of ENT cancer.  OTC antihistamine of choice claritin/zyrtec 10mg  po daily.  Avoid triggers if possible.  Shower prior to bedtime if exposed to triggers.  If allergic dust/dust mites recommend mattress/pillow covers/encasements; washing linens, vacuuming, sweeping,  dusting weekly.  Call or return to clinic as needed if these symptoms worsen or fail to improve as anticipated.     Patient verbalized understanding of instructions, agreed with plan of care and had no further questions at this time.  P2:  Avoidance and hand washing.

## 2018-03-20 NOTE — Patient Instructions (Signed)
Nonspecific Chest Pain Chest pain can be caused by many different conditions. There is always a chance that your pain could be related to something serious, such as a heart attack or a blood clot in your lungs. Chest pain can also be caused by conditions that are not life-threatening. If you have chest pain, it is very important to follow up with your health care provider. What are the causes? Causes of this condition include:  Heartburn.  Pneumonia or bronchitis.  Anxiety or stress.  Inflammation around your heart (pericarditis) or lung (pleuritis or pleurisy).  A blood clot in your lung.  A collapsed lung (pneumothorax). This can develop suddenly on its own (spontaneous pneumothorax) or from trauma to the chest.  Shingles infection (varicella-zoster virus).  Heart attack.  Damage to the bones, muscles, and cartilage that make up your chest wall. This can include: ? Bruised bones due to injury. ? Strained muscles or cartilage due to frequent or repeated coughing or overwork. ? Fracture to one or more ribs. ? Sore cartilage due to inflammation (costochondritis).  What increases the risk? Risk factors for this condition may include:  Activities that increase your risk for trauma or injury to your chest.  Respiratory infections or conditions that cause frequent coughing.  Medical conditions or overeating that can cause heartburn.  Heart disease or family history of heart disease.  Conditions or health behaviors that increase your risk of developing a blood clot.  Having had chicken pox (varicella zoster).  What are the signs or symptoms? Chest pain can feel like:  Burning or tingling on the surface of your chest or deep in your chest.  Crushing, pressure, aching, or squeezing pain.  Dull or sharp pain that is worse when you move, cough, or take a deep breath.  Pain that is also felt in your back, neck, shoulder, or arm, or pain that spreads to any of these  areas.  Your chest pain may come and go, or it may stay constant. How is this diagnosed? Lab tests or other studies may be needed to find the cause of your pain. Your health care provider may have you take a test called an ECG (electrocardiogram). An ECG records your heartbeat patterns at the time the test is performed. You may also have other tests, such as:  Transthoracic echocardiogram (TTE). In this test, sound waves are used to create a picture of the heart structures and to look at how blood flows through your heart.  Transesophageal echocardiogram (TEE).This is a more advanced imaging test that takes images from inside your body. It allows your health care provider to see your heart in finer detail.  Cardiac monitoring. This allows your health care provider to monitor your heart rate and rhythm in real time.  Holter monitor. This is a portable device that records your heartbeat and can help to diagnose abnormal heartbeats. It allows your health care provider to track your heart activity for several days, if needed.  Stress tests. These can be done through exercise or by taking medicine that makes your heart beat more quickly.  Blood tests.  Other imaging tests.  How is this treated? Treatment depends on what is causing your chest pain. Treatment may include:  Medicines. These may include: ? Acid blockers for heartburn. ? Anti-inflammatory medicine. ? Pain medicine for inflammatory conditions. ? Antibiotic medicine, if an infection is present. ? Medicines to dissolve blood clots. ? Medicines to treat coronary artery disease (CAD).  Supportive care for conditions that   do not require medicines. This may include: ? Resting. ? Applying heat or cold packs to injured areas. ? Limiting activities until pain decreases.  Follow these instructions at home: Medicines  If you were prescribed an antibiotic, take it as told by your health care provider. Do not stop taking the  antibiotic even if you start to feel better.  Take over-the-counter and prescription medicines only as told by your health care provider. Lifestyle  Do not use any products that contain nicotine or tobacco, such as cigarettes and e-cigarettes. If you need help quitting, ask your health care provider.  Do not drink alcohol.  Make lifestyle changes as directed by your health care provider. These may include: ? Getting regular exercise. Ask your health care provider to suggest some activities that are safe for you. ? Eating a heart-healthy diet. A registered dietitian can help you to learn healthy eating options. ? Maintaining a healthy weight. ? Managing diabetes, if necessary. ? Reducing stress, such as with yoga or relaxation techniques. General instructions  Avoid any activities that bring on chest pain.  If heartburn is the cause for your chest pain, raise (elevate) the head of your bed about 6 inches (15 cm) by putting blocks under the legs. Sleeping with more pillows does not effectively relieve heartburn because it only changes the position of your head.  Keep all follow-up visits as told by your health care provider. This is important. This includes any further testing if your chest pain does not go away. Contact a health care provider if:  Your chest pain does not go away.  You have a rash with blisters on your chest.  You have a fever.  You have chills. Get help right away if:  Your chest pain is worse.  You have a cough that gets worse, or you cough up blood.  You have severe pain in your abdomen.  You have severe weakness.  You faint.  You have sudden, unexplained chest discomfort.  You have sudden, unexplained discomfort in your arms, back, neck, or jaw.  You have shortness of breath at any time.  You suddenly start to sweat, or your skin gets clammy.  You feel nauseous or you vomit.  You suddenly feel light-headed or dizzy.  Your heart begins to beat  quickly, or it feels like it is skipping beats. These symptoms may represent a serious problem that is an emergency. Do not wait to see if the symptoms will go away. Get medical help right away. Call your local emergency services (911 in the [place] Do not drive yourself to the hospital. This information is not intended to replace advice given to you by your health care provider. Make sure you discuss any questions you have with your health care provider. Document Released: 09/05/2005 Document Revised: 08/20/2016 Document Reviewed: 08/20/2016 Elsevier Interactive Patient Education  2017 [employer] Hypomagnesemia Hypomagnesemia is a condition in which the level of magnesium in the blood is low. Magnesium is a mineral that is found in many foods. It is used in many different processes in the body. Hypomagnesemia can affect every organ in the body. It can cause life-threatening problems. What are the causes? Causes of hypomagnesemia include:  Not getting enough magnesium in your diet.  Malnutrition.  Problems with absorbing magnesium from the intestines.  Dehydration.  Alcohol abuse.  Vomiting.  Severe diarrhea.  Some medicines, including medicines that make you urinate more.  Certain diseases, such as kidney disease, diabetes, and overactive thyroid.  What  are the signs or symptoms?  Involuntary shaking or trembling of a body part (tremor).  Confusion.  Muscle weakness.  Sensitivity to light, sound, and touch.  Psychiatric issues, such as depression, irritability, or psychosis.  Sudden tightening of muscles (muscle spasms).  Tingling in the arms and legs.  A feeling of fluttering of the heart. These symptoms are more severe if magnesium levels drop suddenly. How is this diagnosed? To make a diagnosis, your health care provider will do a physical exam and order blood and urine tests. How is this treated? Treatment will depend on the cause and the severity of your  condition. It may involve:  A magnesium supplement. This can be taken in pill form. It can also be given through an IV tube. This is usually done if the condition is severe.  Changes to your diet. You may be directed to eat foods that have a lot of magnesium, such as green leafy vegetables, peas, beans, and nuts.  Eliminating alcohol from your diet.  Follow these instructions at home:  Include foods with magnesium in your diet. Foods that are rich in magnesium include green vegetables, beans, nuts and seeds, and whole grains.  Take medicines only as directed by your health care provider.  Take magnesium supplements if your health care provider instructs you to do that. Take them as directed.  Have your magnesium levels monitored as directed by your health care provider.  When you are active, drink fluids that contain electrolytes.  Keep all follow-up visits as directed by your health care provider. This is important. Contact a health care provider if:  You get worse instead of better.  Your symptoms return. Get help right away if:  Your symptoms are severe. This information is not intended to replace advice given to you by your health care provider. Make sure you discuss any questions you have with your health care provider. Document Released: 08/22/2005 Document Revised: 05/03/2016 Document Reviewed: 07/12/2014 Elsevier Interactive Patient Education  2018 [employer]. Hypokalemia Hypokalemia means that the amount of potassium in the blood is lower than normal.Potassium is a chemical that helps regulate the amount of fluid in the body (electrolyte). It also stimulates muscle tightening (contraction) and helps nerves work properly.Normally, most of the body's potassium is inside of cells, and only a very small amount is in the blood. Because the amount in the blood is so small, minor changes to potassium levels in the blood can be life-threatening. What are the causes? This  condition may be caused by:  Antibiotic medicine.  Diarrhea or vomiting. Taking too much of a medicine that helps you have a bowel movement (laxative) can cause diarrhea and lead to hypokalemia.  Chronic kidney disease (CKD).  Medicines that help the body get rid of excess fluid (diuretics).  Eating disorders, such as bulimia.  Low magnesium levels in the body.  Sweating a lot.  What are the signs or symptoms? Symptoms of this condition include:  Weakness.  Constipation.  Fatigue.  Muscle cramps.  Mental confusion.  Skipped heartbeats or irregular heartbeat (palpitations).  Tingling or numbness.  How is this diagnosed? This condition is diagnosed with a blood test. How is this treated? Hypokalemia can be treated by taking potassium supplements by mouth or adjusting the medicines that you take. Treatment may also include eating more foods that contain a lot of potassium. If your potassium level is very low, you may need to get potassium through an IV tube in one of your veins and  be monitored in the hospital. Follow these instructions at home:  Take over-the-counter and prescription medicines only as told by your health care provider. This includes vitamins and supplements.  Eat a healthy diet. A healthy diet includes fresh fruits and vegetables, whole grains, healthy fats, and lean proteins.  If instructed, eat more foods that contain a lot of potassium, such as: ? Nuts, such as peanuts and pistachios. ? Seeds, such as sunflower seeds and pumpkin seeds. ? Peas, lentils, and lima beans. ? Whole grain and bran cereals and breads. ? Fresh fruits and vegetables, such as apricots, avocado, bananas, cantaloupe, kiwi, oranges, tomatoes, asparagus, and potatoes. ? Orange juice. ? Tomato juice. ? Red meats. ? Yogurt.  Keep all follow-up visits as told by your health care provider. This is important. Contact a health care provider if:  You have weakness that gets  worse.  You feel your heart pounding or racing.  You vomit.  You have diarrhea.  You have diabetes (diabetes mellitus) and you have trouble keeping your blood sugar (glucose) in your target range. Get help right away if:  You have chest pain.  You have shortness of breath.  You have vomiting or diarrhea that lasts for more than 2 days.  You faint. This information is not intended to replace advice given to you by your health care provider. Make sure you discuss any questions you have with your health care provider. Document Released: 11/26/2005 Document Revised: 07/14/2016 Document Reviewed: 07/14/2016 Elsevier Interactive Patient Education  2018 [employer]. Palpitations A palpitation is the feeling that your heartbeat is irregular or is faster than normal. It may feel like your heart is fluttering or skipping a beat. Palpitations are usually not a serious problem. They may be caused by many things, including smoking, caffeine, alcohol, stress, and certain medicines. Although most causes of palpitations are not serious, palpitations can be a sign of a serious medical problem. In some cases, you may need further medical evaluation. Follow these instructions at home: Pay attention to any changes in your symptoms. Take these actions to help with your condition:  Avoid the following: ? Caffeinated coffee, tea, soft drinks, diet pills, and energy drinks. ? Chocolate. ? Alcohol.  Do not use any tobacco products, such as cigarettes, chewing tobacco, and e-cigarettes. If you need help quitting, ask your health care provider.  Try to reduce your stress and anxiety. Things that can help you relax include: ? Yoga. ? Meditation. ? Physical activity, such as swimming, jogging, or walking. ? Biofeedback. This is a method that helps you learn to use your mind to control things in your body, such as your heartbeats.  Get plenty of rest and sleep.  Take over-the-counter and prescription  medicines only as told by your health care provider.  Keep all follow-up visits as told by your health care provider. This is important.  Contact a health care provider if:  You continue to have a fast or irregular heartbeat after 24 hours.  Your palpitations occur more often. Get help right away if:  You have chest pain or shortness of breath.  You have a severe headache.  You feel dizzy or you faint. This information is not intended to replace advice given to you by your health care provider. Make sure you discuss any questions you have with your health care provider. Document Released: 11/23/2000 Document Revised: 04/30/2016 Document Reviewed: 08/11/2015 Elsevier Interactive Patient Education  [employer].

## 2018-03-25 ENCOUNTER — Encounter: Payer: Self-pay | Admitting: Registered Nurse

## 2018-03-25 ENCOUNTER — Telehealth: Payer: Self-pay | Admitting: Registered Nurse

## 2018-03-25 NOTE — Telephone Encounter (Signed)
Contacted patient to follow up regarding chest pain intermittent last week.  Reviewed care everywhere and chart review no visits noted since last appt with [facility].  Patient reported he did not drink gatorade or powerade because it made him nauseas "I had to mix the colonoscopy prep with gatorade and just thinking about it now makes me nauseaus.  So I added a lot of salt to my lunch last Thursday and stopped taking the corn silk supplement and I did not have any further chest pain last week/this weekend or this week"  Patient denied palpitations, shortness of breath, visual changes/headache and is feeling well.  Patient asked if he could take garlic.  Discussed with patient garlic acts similarily to aspirin/thins the blood and could cause bleeding.  Stop garlic if blood noted in urine/stool or bruising noted anywhere on body/gums bleeding.  Discussed with patient taking baby aspirin every day not recommended and each patient to speak with PCM/cardiology before starting a daily aspirin.  Patient has cardiology appt pending 30 Apr and he will speak with cardiologist at that time.  Patient to follow up if chest pain reoccurs same day.  Patient verbalized understanding information/instructions, agreed with plan of care and had no further questions at this time.

## 2018-03-27 ENCOUNTER — Telehealth: Payer: Self-pay | Admitting: Registered Nurse

## 2018-03-27 ENCOUNTER — Encounter: Payer: Self-pay | Admitting: Registered Nurse

## 2018-03-27 NOTE — Telephone Encounter (Signed)
Patient requesting Rx for scopalamine patches for motion sickness planning to go on cruise 20 Apr 2018 and the last time he was on boat had nausea/vomiting and took oral which worked okay but wondering if patch would be better.  Has not tried accupressure bracelet in the past either.  Discussed use with patient and where to buy.  Reviewed Epocrates and it stated scopalamine and potassium chloride solid forms contraaindicated may delay passage through GI tract and increased risk of ulcerative/stenotic lesions.  Will review other options in up to date and follow up with patient.

## 2018-03-27 NOTE — Telephone Encounter (Signed)
Reviewed alternative medications for patient and all antihistamines (meclizine,  Dimenhydrinate)  have same warning as scopalamine with his potassium chloride required for hypokalemia and palpations with furosemide.  Ginger candies, accupressure bands and I can Rx zofran for prn nausea/vomiting for him.  Up to date handout on motion sickness patient information also emailed to patient work address.  Promethazine also interacts with too many of his chronic medications.  Diazepam is another option  But is sedating and unfortunately due to contract constraints I am not allowed to prescribe controlled substances in [facility] for patients but he could discuss with his PCM.  Please notify patient 19 Apr of above and if further questions please telephone me.

## 2018-03-28 NOTE — Telephone Encounter (Signed)
Reviewed all above medication recommendations with pt. He verbalizes understanding regarding avoidance of antihistamines r/t GI ulceration risk as discussed by NP and RN. Asked if stopping potassium for a week was an option, RN informed pt it is not due to risks outweighing benefits-- hypokalemia, palpitations, worsening abnormal rhythms, cardiac arrest vs benefit of nausea/motion sickness relief especially when other alternatives are available. Pt agreeable.  Also advised no promethazine he could ask his pcp about diazepam but declines this, does not want to be sedated.  Reviewed ginger and ginger root options that can be purchased in store and online in many different forms and acupressure bands. Pt sts he will likely look for both of these since they will not interact with anything.  Would like to also have the Zofran prescribed to have on hand if needed.  Pt not leaving for cruise until May 12th. BP checked today 142/98, p48. Asymptomatic with HTN, irregular pulse.

## 2018-04-01 MED ORDER — ONDANSETRON HCL 4 MG PO TABS: 4.0000 mg | ORAL_TABLET | Freq: Two times a day (BID) | ORAL | 0 refills | Status: AC | PRN

## 2018-04-01 NOTE — Addendum Note (Signed)
Addended by: Albina Billet A on: 04/01/2018 11:07 AM   Modules accepted: Orders

## 2018-04-01 NOTE — Telephone Encounter (Signed)
Filled generic zofran for patient 4-8mg  po BID prn nausea/vomiting #20 RF0 for use when on cruise in May 2019.  Electronic Rx to patient pharmacy of choice.

## 2018-04-08 ENCOUNTER — Encounter: Payer: Self-pay | Admitting: Cardiology

## 2018-04-08 ENCOUNTER — Ambulatory Visit (INDEPENDENT_AMBULATORY_CARE_PROVIDER_SITE_OTHER): Payer: PRIVATE HEALTH INSURANCE | Admitting: Cardiology

## 2018-04-08 VITALS — BP 122/88 | HR 62 | Ht 68.0 in | Wt 252.5 lb

## 2018-04-08 DIAGNOSIS — I10 Essential (primary) hypertension: Secondary | ICD-10-CM | POA: Diagnosis not present

## 2018-04-08 DIAGNOSIS — R002 Palpitations: Secondary | ICD-10-CM

## 2018-04-08 DIAGNOSIS — R079 Chest pain, unspecified: Secondary | ICD-10-CM

## 2018-04-08 NOTE — Progress Notes (Signed)
Cardiology Office Note    Date:  04/08/2018   ID:  Ryan Glass, DOB 12-Aug-1956, MRN [id number]  PCP:  [name], MD  Cardiologist:  [name], MD   Chief Complaint  Patient presents with  . New Patient (Initial Visit)    CP and palpitations    History of Present Illness:  Ryan Glass is a 62 y.o. male who is being seen today for the evaluation of chest pain at the request of Ryan Glass, Ryan M, MD.  This is a pleasant 62 year old [place] male with no prior cardiac history recently saw his PCP and complained of sharp shooting pain in the left chest rating down the left arm.  This started on 03/19/2018.  He says it lasted about 30 to 45 seconds at a time but then would spontaneously resolve.  He denies associated symptoms of shortness of breath diaphoresis or rotations.  Apparently he has had palpitations in the past but for the past 2 to 3 months not has not really noticed any palpitations.  His heart rate in PCPs office was noted to be 48 to 51 bpm and was irregular.  Apparently he has had frequent PVCs on Holter monitor in the past and has had a history of lower extremity edema in the past as well.  He also has been taking an herb diuretic on his own and labs done a few days ago showed a potassium 3.8, creatinine 2.05 TSH 0.58 and ALT of 80.   Past Medical History:  Diagnosis Date  . Umbilical hernia     History reviewed. No pertinent surgical history.  Current Medications: Current Meds  Medication Sig  . amLODipine (NORVASC) 10 MG tablet Take 1 tablet (10 mg total) by mouth daily.  Marland Kitchen atenolol (TENORMIN) 25 MG tablet Take 1 tablet (25 mg total) by mouth 2 (two) times daily.  . colchicine 0.6 MG tablet Take 1-2 tablets by mouth daily.  . cyclobenzaprine (FLEXERIL) 5 MG tablet Take 5 mg by mouth.  . diclofenac (VOLTAREN) 75 MG EC tablet Take 1 tablet (75 mg total) by mouth 2 (two) times daily.  . fluticasone (FLONASE) 50 MCG/ACT nasal spray Place 1 spray into both  nostrils 2 (two) times daily.  . furosemide (LASIX) 40 MG tablet Take 1 tablet (40 mg total) by mouth daily.  Marland Kitchen gabapentin (NEURONTIN) 300 MG capsule Take 1 capsule by mouth 3 (three) times daily.  Marland Kitchen losartan-hydrochlorothiazide (HYZAAR) 100-25 MG tablet Take 1 tablet by mouth daily.  . magnesium oxide (MAG-OX) 400 MG tablet Take 1 tablet by mouth daily.  . montelukast (SINGULAIR) 10 MG tablet Take 1 tablet (10 mg total) by mouth at bedtime.  . ondansetron (ZOFRAN) 4 MG tablet Take 1-2 tablets (4-8 mg total) by mouth 2 (two) times daily as needed for up to 10 days for nausea or vomiting.  . Potassium Chloride ER 20 MEQ TBCR Take 2 tablets by mouth 2 (two) times daily.  . sildenafil (REVATIO) 20 MG tablet Take by mouth.    Allergies:   Amlodipine besylate and Aspirin   Social History   Socioeconomic History  . Marital status: Single    Spouse name: Not on file  . Number of children: Not on file  . Years of education: Not on file  . Highest education level: Not on file  Occupational History  . Not on file  Social Needs  . Financial resource strain: Not on file  . Food insecurity:    Worry: Not  on file    Inability: Not on file  . Transportation needs:    Medical: Not on file    Non-medical: Not on file  Tobacco Use  . Smoking status: Never Smoker  . Smokeless tobacco: Never Used  Substance and Sexual Activity  . Alcohol use: Yes  . Drug use: Never  . Sexual activity: Not on file  Lifestyle  . Physical activity:    Days per week: Not on file    Minutes per session: Not on file  . Stress: Not on file  Relationships  . Social connections:    Talks on phone: Not on file    Gets together: Not on file    Attends religious service: Not on file    Active member of club or organization: Not on file    Attends meetings of clubs or organizations: Not on file    Relationship status: Not on file  Other Topics Concern  . Not on file  Social History Narrative  . Not on file      Family History:  The patient's family history is not on file.   ROS:   Please see the history of present illness.    ROS All other systems reviewed and are negative.  No flowsheet data found.     PHYSICAL EXAM:   VS:  BP 122/88   Pulse 62   Ht 5\' 8"  (1.727 Glass)   Wt 252 lb 8 oz (114.5 kg)   SpO2 98%   BMI 38.39 kg/Glass    GEN: Well nourished, well developed, in no acute distress  HEENT: normal  Neck: no JVD, carotid bruits, or masses Cardiac: RRR; no murmurs, rubs, or gallops.  Trace edema.  Intact distal pulses bilaterally.  Respiratory:  clear to auscultation bilaterally, normal work of breathing GI: soft, nontender, nondistended, + BS MS: no deformity or atrophy  Skin: warm and dry, no rash Neuro:  Alert and Oriented x 3, Strength and sensation are intact Psych: euthymic mood, full affect  Wt Readings from Last 3 Encounters:  04/08/18 252 lb 8 oz (114.5 kg)  08/30/17 247 lb (112 kg)  04/04/17 256 lb (116.1 kg)      Studies/Labs Reviewed:   EKG:  EKG is ordered today.  The ekg ordered today demonstrates NSR at 64bpm with PVCs  Recent Labs: 08/30/2017: ALT 30; BUN 18; Creatinine, Ser 1.12; Hemoglobin 14.7; Platelets 199; Potassium 3.9; Sodium 137; TSH 2.000   Lipid Panel    Component Value Date/Time   CHOL 182 08/30/2017 0911   TRIG 138 08/30/2017 0911   HDL 35 (L) 08/30/2017 0911   CHOLHDL 5.2 (H) 08/30/2017 0911   LDLCALC 119 (H) 08/30/2017 0911    Additional studies/ records that were reviewed today include:    Office notes from PCP  ASSESSMENT:    1. Chest pain, unspecified type   2. Palpitations   3. Essential hypertension      PLAN:  In order of problems listed above:  1.  Chest pain -this is atypical and that it is sharp and stabbing and only lasts a few seconds at a time.  He has been having some dyspnea on exertion with minimal activity including going up stairs.  He has a family history of his father having MI in his 82s but the patient  has never smoked.  I will set him up for a 2D echocardiogram to rule out structural heart disease as well as a stress Myoview to rule out inducible ischemia.  2.  Palpitations -he has had a history of PVCs on event monitor in the past.  2D EKG today shows occasional PVCs.  I will repeat event monitor to rule out any other arrhythmia such as atrial fibrillation and assess PVC load.  He is already on atenolol 25 mg twice daily.  3.  Hypertension -BP is controlled on exam today.  He will continue on Hyzaar 100-25 mg daily, atenolol 25 mg twice daily and amlodipine 10 mg daily  4.  Lower extremity edema - this is controlled on Lasix 40 mg daily    Medication Adjustments/Labs and Tests Ordered: Current medicines are reviewed at length with the patient today.  Concerns regarding medicines are outlined above.  Medication changes, Labs and Tests ordered today are listed in the Patient Instructions below.  There are no Patient Instructions on file for this visit.   Signed, [name], MD  04/08/2018 2:20 PM    [facility] [address] [place], [place], [place]  [address] Phone: [phone]; Fax: [phone]

## 2018-04-08 NOTE — Patient Instructions (Signed)
Medication Instructions:  Your physician recommends that you continue on your current medications as directed. Please refer to the Current Medication list given to you today.  If you need a refill on your cardiac medications, please contact your pharmacy first.  Labwork: None ordered   Testing/Procedures: Your physician has requested that you have an echocardiogram. Echocardiography is a painless test that uses sound waves to create images of your heart. It provides your doctor with information about the size and shape of your heart and how well your heart's chambers and valves are working. This procedure takes approximately one hour. There are no restrictions for this procedure.  Your physician has requested that you have en exercise stress myoview. For further information please visit [email or web address] Please follow instruction sheet, as given.  Your physician has recommended that you wear an event monitor. Event monitors are medical devices that record the heart's electrical activity. Doctors most often [place] these monitors to diagnose arrhythmias. Arrhythmias are problems with the speed or rhythm of the heartbeat. The monitor is a small, portable device. You can wear one while you do your normal daily activities. This is usually used to diagnose what is causing palpitations/syncope (passing out).  Follow-Up: Your physician wants you to follow-up as needed with Dr. Mayford Knife   Any Other Special Instructions Will Be Listed Below (If Applicable).   Thank you for choosing [facility]    Lyda Perone, RN  [phone]  If you need a refill on your cardiac medications before your next appointment, please call your pharmacy.

## 2018-04-11 ENCOUNTER — Other Ambulatory Visit: Payer: Self-pay

## 2018-04-11 ENCOUNTER — Ambulatory Visit (HOSPITAL_COMMUNITY): Payer: PRIVATE HEALTH INSURANCE | Attending: Cardiology

## 2018-04-11 DIAGNOSIS — I10 Essential (primary) hypertension: Secondary | ICD-10-CM | POA: Insufficient documentation

## 2018-04-11 DIAGNOSIS — R002 Palpitations: Secondary | ICD-10-CM | POA: Diagnosis not present

## 2018-04-11 DIAGNOSIS — R079 Chest pain, unspecified: Secondary | ICD-10-CM | POA: Insufficient documentation

## 2018-04-14 ENCOUNTER — Encounter: Payer: Self-pay | Admitting: Cardiology

## 2018-04-14 DIAGNOSIS — I77810 Thoracic aortic ectasia: Secondary | ICD-10-CM | POA: Insufficient documentation

## 2018-04-15 ENCOUNTER — Telehealth: Payer: Self-pay

## 2018-04-15 ENCOUNTER — Telehealth: Payer: Self-pay | Admitting: Cardiology

## 2018-04-15 DIAGNOSIS — I77810 Thoracic aortic ectasia: Secondary | ICD-10-CM

## 2018-04-15 NOTE — Addendum Note (Signed)
Addended by: Phineas Semen on: 04/15/2018 04:52 PM   Modules accepted: Orders

## 2018-04-15 NOTE — Telephone Encounter (Signed)
Notes recorded by Phineas Semen, RN on 04/15/2018 at 11:05 AM EDT Patient informed of echo results and informed of Dr. Norris Cross recommendation for a chest and abdominal CT angio to assess entire aorta. Informed taht aortic root is dilated at 55mm and ascending aorta is dilated at 4mm. Patient states he would like for me to speak with his wife in regards to echo results and he is leaving for vacation and would like CT scan done once he returns. I left message on wife's VM to call back in regards to echo results. Informed patient that I would order CT scan to be scheduled once he gets back from vacation. Patient verbalized understanding and thankful for the call.   Notes recorded by Quintella Reichert, MD on 04/14/2018 at 5:24 PM EDT Echo showed normal LVF with EF 60-65% with moderate LVH and increased stiffness of heart muscle, moderately dilated aortic root at 5mm and ascending aorta at 34mm. Please get a chest and abdominal CT angio to assess entire aorta.

## 2018-04-15 NOTE — Telephone Encounter (Signed)
This encounter was created in error - please disregard.

## 2018-04-15 NOTE — Telephone Encounter (Signed)
New message  Pt wife verbalized that she is returning call for RN  Because pt spoken to RN but he is from [place] and wife   stated that he did not understand and he asked that wife call rn

## 2018-04-15 NOTE — Telephone Encounter (Signed)
I returned call to patient's spouse. I explained echo results and Dr. Norris Cross recommendation for Chest and abdominal CTA to assess aorta. Patient informed that our scheduler will call to set up appt. She verbalized understanding and thankful for the call.

## 2018-04-17 ENCOUNTER — Encounter: Payer: Self-pay | Admitting: Registered Nurse

## 2018-04-17 ENCOUNTER — Telehealth: Payer: Self-pay | Admitting: Registered Nurse

## 2018-04-17 ENCOUNTER — Other Ambulatory Visit: Payer: Self-pay | Admitting: *Deleted

## 2018-04-17 NOTE — Telephone Encounter (Signed)
Discussed with patient enlarged aorta on [place] and cardiology recommended CT scan to ensure no aneurysm especially with US findings and chest pain history this winter/spring.  Patient reported he would schedule upon his return from vacation/cruise.  Patient wanted to know what his cost to have the CT scan would be--discussed with him to contact his insurance company and the radiology dept where he is going to schedule exam and they will be able to tell him if copay/deductible and actual cost for procedure.  Discussed with patient important for him to take his blood pressure medications as prescribed to keep blood pressure in normal range as vessal dilation occurs with elevated blood pressures.  Discussed aneurysm is when wall of vessel gets weak like a hernia but the risk of weak area is that it could rupture and bleeding results and due to large size of blood vessel it would be a large/quick blood loss/put his life at risk. Patient verbalized understanding of information, agreed to schedule CT scan and had no further questions at this time.

## 2018-04-17 NOTE — Telephone Encounter (Signed)
Pt returned call to clinic, spoke with NP.

## 2018-04-17 NOTE — Telephone Encounter (Signed)
Error - duplicate

## 2018-04-17 NOTE — Telephone Encounter (Signed)
Patient had questions regarding tests that cardiology ordered stating his US showed enlarged atrium and he is supposed to schedule 1 month cardiac monitor, stress test and CT scan but he was unsure why he needs CT scan.  Discussed with patient I would review his cardiology note in Epic and contact him with further information.  Patient verbalized understanding information/instructions and had no further questions at this time.  Reviewed echo results and cardiology appt note dated 04/08/2018 and tcon 04/15/18  Enlarged aorta on [place]; CT scan recommended by MD Turner.  Telephone message left for patient to contact clinic to review results and Dr Mayford Knife recommendations/check for aortic aneurysm via CT scan due to new sharp chest pain this spring.

## 2018-04-18 ENCOUNTER — Encounter: Payer: Self-pay | Admitting: Cardiology

## 2018-04-29 ENCOUNTER — Other Ambulatory Visit: Payer: PRIVATE HEALTH INSURANCE

## 2018-04-29 DIAGNOSIS — I77810 Thoracic aortic ectasia: Secondary | ICD-10-CM

## 2018-04-30 LAB — BASIC METABOLIC PANEL
BUN/Creatinine Ratio: 17 (ref 10–24)
BUN: 19 mg/dL (ref 8–27)
CO2: 21 mmol/L (ref 20–29)
Calcium: 9.7 mg/dL (ref 8.6–10.2)
Chloride: 102 mmol/L (ref 96–106)
Creatinine, Ser: 1.12 mg/dL (ref 0.76–1.27)
GFR calc Af Amer: 81 mL/min/{1.73_m2} (ref >59)
GFR calc non Af Amer: 70 mL/min/{1.73_m2} (ref >59)
Glucose: 109 mg/dL — ABNORMAL HIGH (ref 65–99)
Potassium: 3.7 mmol/L (ref 3.5–5.2)
Sodium: 142 mmol/L (ref 134–144)

## 2018-05-01 ENCOUNTER — Inpatient Hospital Stay: Admission: RE | Admit: 2018-05-01 | Payer: Self-pay | Source: Ambulatory Visit

## 2018-05-08 ENCOUNTER — Telehealth: Payer: Self-pay | Admitting: Registered Nurse

## 2018-05-08 ENCOUNTER — Encounter: Payer: Self-pay | Admitting: Registered Nurse

## 2018-05-08 MED ORDER — ATENOLOL 25 MG PO TABS
25.0000 mg | ORAL_TABLET | Freq: Two times a day (BID) | ORAL | 0 refills | Status: DC
Start: 2018-05-08 — End: 2018-09-23

## 2018-05-08 MED ORDER — FUROSEMIDE 40 MG PO TABS: 40.0000 mg | ORAL_TABLET | Freq: Every day | ORAL | 0 refills | Status: DC

## 2018-05-08 MED ORDER — POTASSIUM CHLORIDE ER 20 MEQ PO TBCR: 2.0000 | EXTENDED_RELEASE_TABLET | Freq: Two times a day (BID) | ORAL | 0 refills | Status: DC

## 2018-05-08 MED ORDER — AMLODIPINE BESYLATE 10 MG PO TABS: 10.0000 mg | ORAL_TABLET | Freq: Every day | ORAL | 0 refills | Status: DC

## 2018-05-08 NOTE — Telephone Encounter (Signed)
Bridge refill 30 days furosemide 40mg  po daily; potassium chloride take 2 tabs BID #120 RF0 and atenolol 25mg  po BID #60 RF0 dispensed to patient from [facility].  Due to power outtage epic not available for review at that time refilled based on previous paper Rx in his outpatient paper record at [facility].  Discussed with patient he is to follow up with cardiology for new Rx and follow up testing as discussed prior to his vacation/cruise which he just returned to work.  Patient reported he did well and did not have motion sickness due to calmer seas/ocean this vacation.  He was running low on potassium and did not contact clinic last week so has only been taking 1 tab po BID instead of 2 tabs.  Patient needs to reschedule CT scan, HOLTER.  Patient verbalized understanding of information/instructions, agreed with plan of care and had no further questions at this time.  Per reivew of cardiology Dr note patient is to contact his pharmacy for medication refill requests will have RN [facility] contact Dr Mayford Knife office for new Rx amlodipine/atenolol, furosemide and potassium.

## 2018-05-09 NOTE — Telephone Encounter (Signed)
Pt in to clinic reports he has not checked meds filled yesterday for specific needs other than potassium. Will check at home to see if Amlodipine is needed and inform RN on Monday. Believes he is good on all meds except the potassium.   Reports he is scheduled for stress test and holter placement 6/5. Sts he cancelled originally scheduled CT angio chest & abd because he was waiting on [employer] to confirm their authorization directly with him which they had not done yet up until the original scheduled date. Discussed insurance coverage, authorization email he received re: CT angio's, and finance options through [facility]. Directed pt to [facility] customer service for any further clarification and to check his current status of meeting his deductible. Pt appreciative of clarification and sts he will call and reschedule his CT-angio's. Has no further questions/concerns at this time.

## 2018-05-12 ENCOUNTER — Telehealth (HOSPITAL_COMMUNITY): Payer: Self-pay | Admitting: *Deleted

## 2018-05-12 NOTE — Telephone Encounter (Signed)
Patient given detailed instructions per Myocardial Perfusion Study Information Sheet for the test on 05/14/18. Patient notified to arrive 15 minutes early and that it is imperative to arrive on time for appointment to keep from having the test rescheduled.  If you need to cancel or reschedule your appointment, please call the office within 24 hours of your appointment. . Patient verbalized understanding. Zyionna Pesce Jacqueline    

## 2018-05-12 NOTE — Telephone Encounter (Signed)
Noted awaiting further update if he has amlodipine at home or needs bridge refill also

## 2018-05-14 ENCOUNTER — Ambulatory Visit (HOSPITAL_COMMUNITY): Payer: No Typology Code available for payment source | Attending: Cardiology

## 2018-05-14 ENCOUNTER — Encounter (INDEPENDENT_AMBULATORY_CARE_PROVIDER_SITE_OTHER): Payer: Self-pay

## 2018-05-14 ENCOUNTER — Ambulatory Visit: Payer: PRIVATE HEALTH INSURANCE

## 2018-05-14 DIAGNOSIS — R002 Palpitations: Secondary | ICD-10-CM | POA: Insufficient documentation

## 2018-05-14 DIAGNOSIS — I2510 Atherosclerotic heart disease of native coronary artery without angina pectoris: Secondary | ICD-10-CM | POA: Insufficient documentation

## 2018-05-14 DIAGNOSIS — R0609 Other forms of dyspnea: Secondary | ICD-10-CM | POA: Insufficient documentation

## 2018-05-14 DIAGNOSIS — I10 Essential (primary) hypertension: Secondary | ICD-10-CM | POA: Insufficient documentation

## 2018-05-14 DIAGNOSIS — R079 Chest pain, unspecified: Secondary | ICD-10-CM | POA: Insufficient documentation

## 2018-05-14 LAB — MYOCARDIAL PERFUSION IMAGING
LV dias vol: 181 mL (ref 62–150)
LV sys vol: 72 mL
Peak HR: 105 {beats}/min
RATE: 0.29
Rest HR: 65 {beats}/min
SDS: 0
SRS: 0
SSS: 0
TID: 0.92

## 2018-05-14 IMAGING — NM NM MISC PROCEDURE
5 series · 30 of 30 positions shown · non-contrast
Comparison: none

[Series 1: wbr_r-proj_st rest · 6.51mm/px · 6 of 64 frames shown]
[frame 6/64]
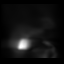
[frame 16/64]
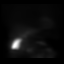
[frame 27/64]
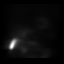
[frame 38/64]
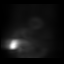
[frame 48/64]
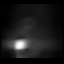
[frame 59/64]
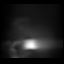

[Series 1: rest · 6.51mm/px · 6 of 64 frames shown]
[frame 6/64]
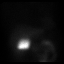
[frame 16/64]
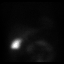
[frame 27/64]
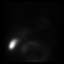
[frame 38/64]
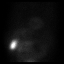
[frame 48/64]
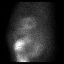
[frame 59/64]
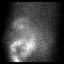

[Series 2: wbr_s-proj_st stress - gated · 6.51mm/px · 6 of 512 frames shown]
[frame 43/512]
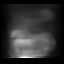
[frame 128/512]
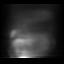
[frame 214/512]
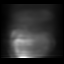
[frame 299/512]
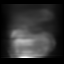
[frame 384/512]
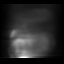
[frame 470/512]
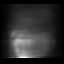

[Series 2: stress - gated · 6.51mm/px · 6 of 512 frames shown]
[frame 43/512  full-range]
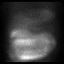
[frame 128/512  full-range]
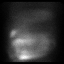
[frame 214/512  full-range]
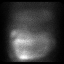
[frame 299/512  full-range]
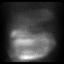
[frame 384/512  full-range]
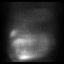
[frame 470/512  full-range]
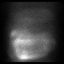

[Series 3: stress - perfusion · 6.51mm/px · 6 of 64 frames shown]
[frame 6/64]
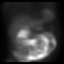
[frame 16/64]
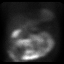
[frame 27/64]
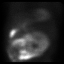
[frame 38/64]
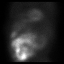
[frame 48/64]
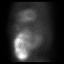
[frame 59/64]
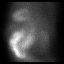

[30 of 30 positions shown; findings below may reference images not displayed]

Canned report from images found in remote index.

Refer to host system for actual result text.

## 2018-05-14 MED ORDER — TECHNETIUM TC 99M TETROFOSMIN IV KIT
30.4000 | PACK | Freq: Once | INTRAVENOUS | Status: AC | PRN
  Administered 2018-05-14: 30.4 via INTRAVENOUS
  Filled 2018-05-14: qty 31

## 2018-05-14 MED ORDER — TECHNETIUM TC 99M TETROFOSMIN IV KIT
10.6000 | PACK | Freq: Once | INTRAVENOUS | Status: AC | PRN
  Administered 2018-05-14: 10.6 via INTRAVENOUS
  Filled 2018-05-14: qty 11

## 2018-05-14 MED ORDER — REGADENOSON 0.4 MG/5ML IV SOLN
0.4000 mg | Freq: Once | INTRAVENOUS | Status: AC
  Administered 2018-05-14: 0.4 mg via INTRAVENOUS

## 2018-05-28 ENCOUNTER — Ambulatory Visit: Payer: PRIVATE HEALTH INSURANCE

## 2018-05-28 ENCOUNTER — Ambulatory Visit (INDEPENDENT_AMBULATORY_CARE_PROVIDER_SITE_OTHER)
Admission: RE | Admit: 2018-05-28 | Discharge: 2018-05-28 | Disposition: A | Discharge status: Home / Self Care | Payer: PRIVATE HEALTH INSURANCE | Source: Ambulatory Visit | Attending: Cardiology | Admitting: Cardiology

## 2018-05-28 DIAGNOSIS — I77810 Thoracic aortic ectasia: Secondary | ICD-10-CM | POA: Diagnosis not present

## 2018-05-28 IMAGING — CT CT ANGIO ABDOMEN
2 of 9 series · 15 of 46 positions shown, 17 images · IV contrast (iopamidol)
Comparison: CT chest, abdomen, and pelvis dated [DATE].

CLINICAL DATA: Thoracic aortic aneurysm follow-up.

EXAM:
CT ANGIOGRAPHY CHEST AND ABDOMEN
TECHNIQUE: Multidetector CT imaging of the chest and abdomen was performed
using the standard protocol during bolus administration of
intravenous contrast. Multiplanar CT image reconstructions and MIPs
were obtained to evaluate the vascular anatomy.
CONTRAST:  100mL [EB] IOPAMIDOL ([EB]) INJECTION 76%

[Series 6: aorta 3.0 i31f 2 · axial · 0.97mm/px · z∈[-462,-78]mm · 12 of 142 slices shown, 14 images]
[im 7/142  soft-tissue]
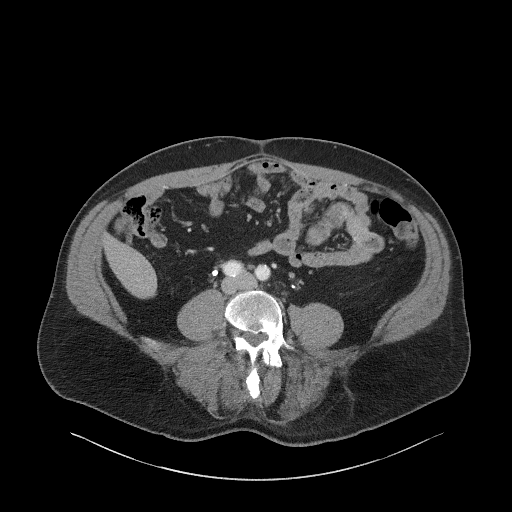
[im 7/142  bone]
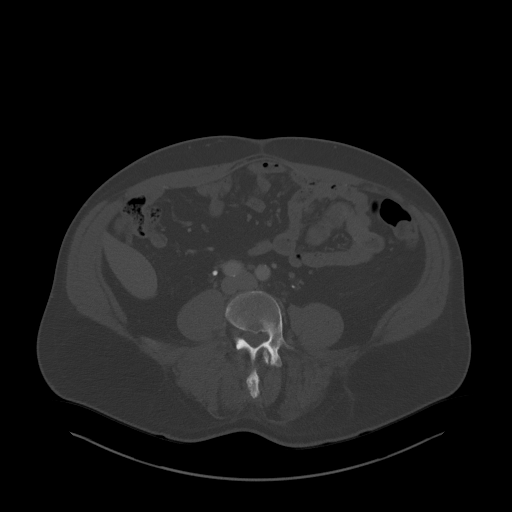
[im 19/142  soft-tissue]
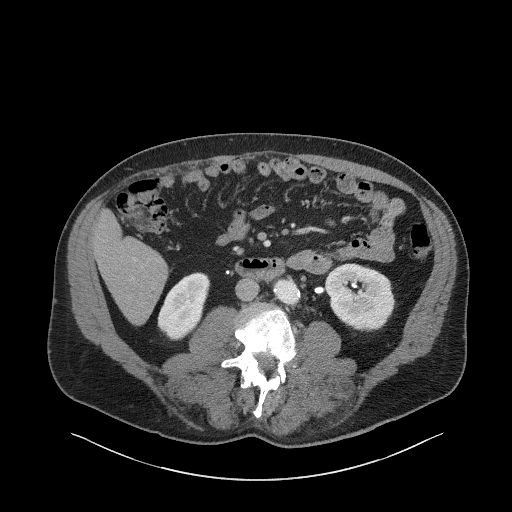
[im 31/142  soft-tissue]
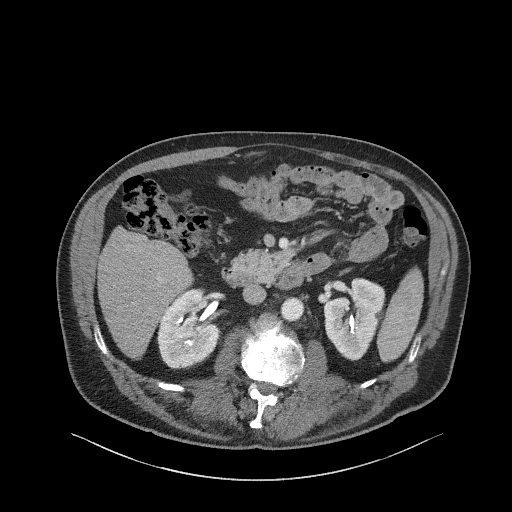
[im 43/142  soft-tissue]
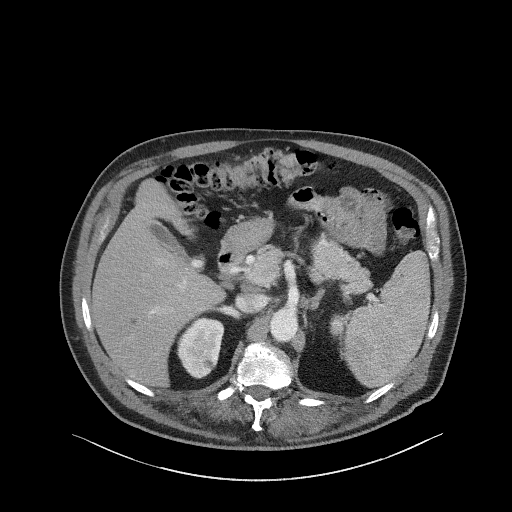
[im 56/142  soft-tissue]
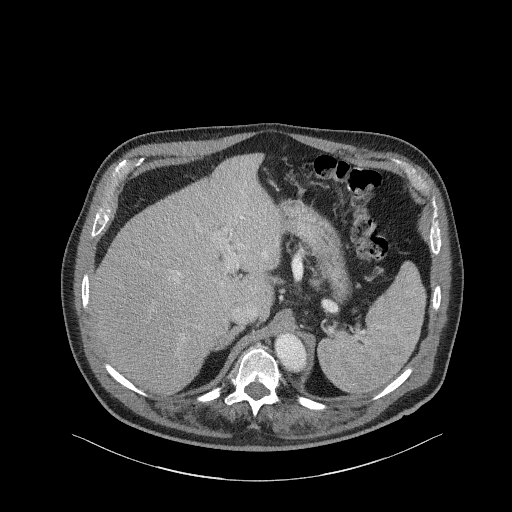
[im 68/142  soft-tissue]
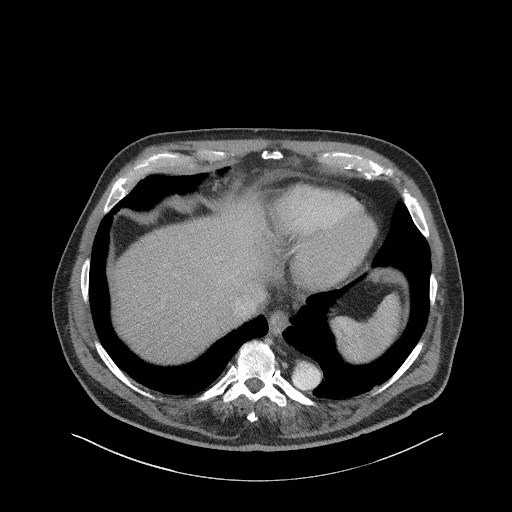
[im 74/142  soft-tissue]
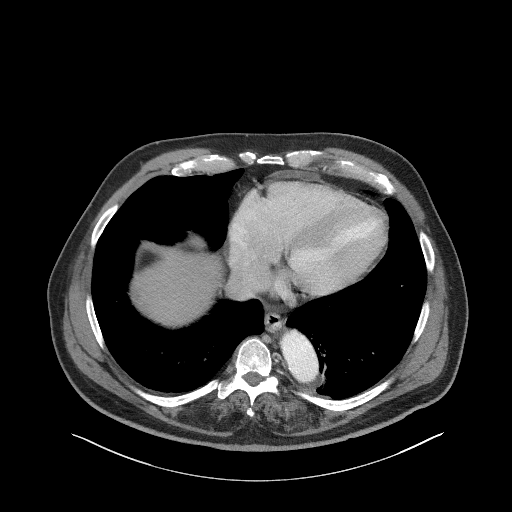
[im 86/142  soft-tissue]
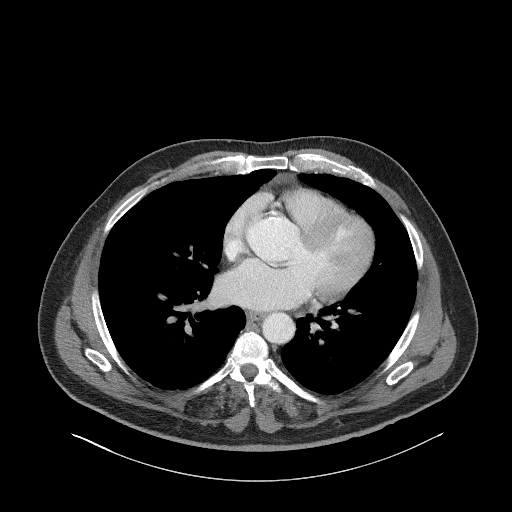
[im 99/142  soft-tissue]
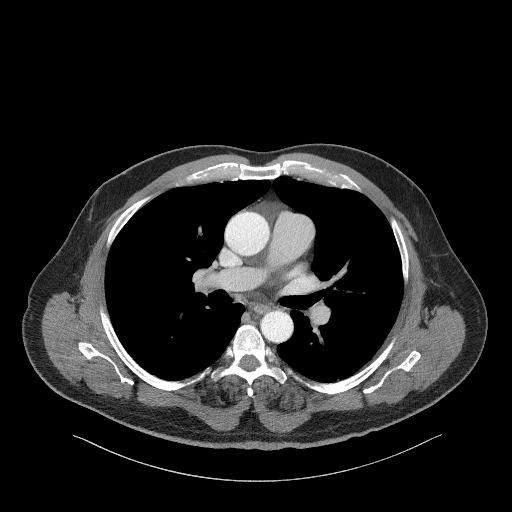
[im 99/142  bone]
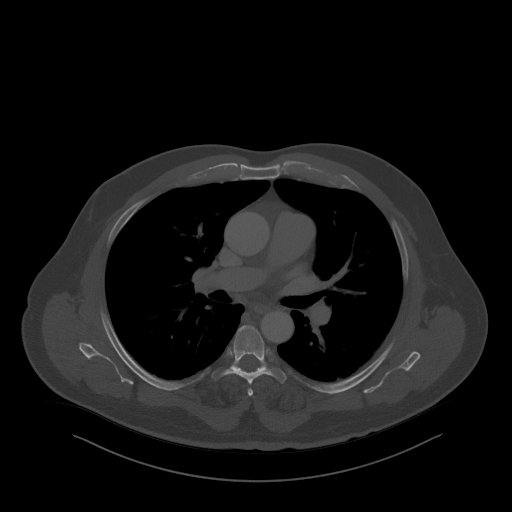
[im 111/142  soft-tissue]
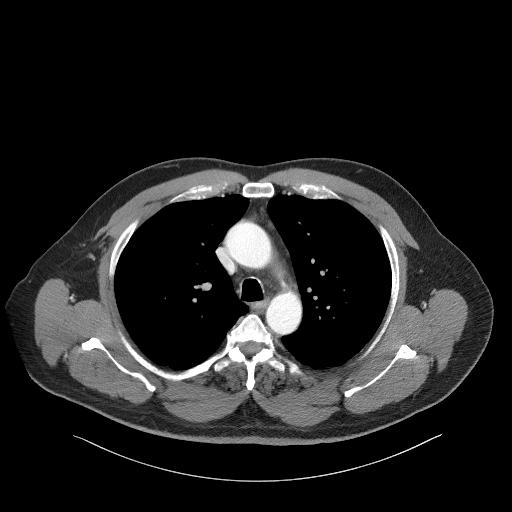
[im 123/142  soft-tissue]
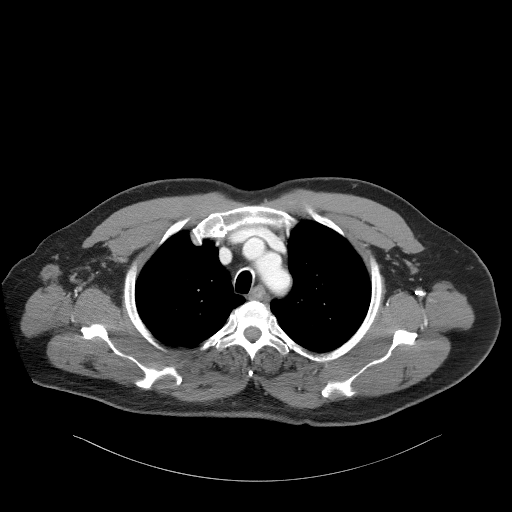
[im 135/142  soft-tissue]
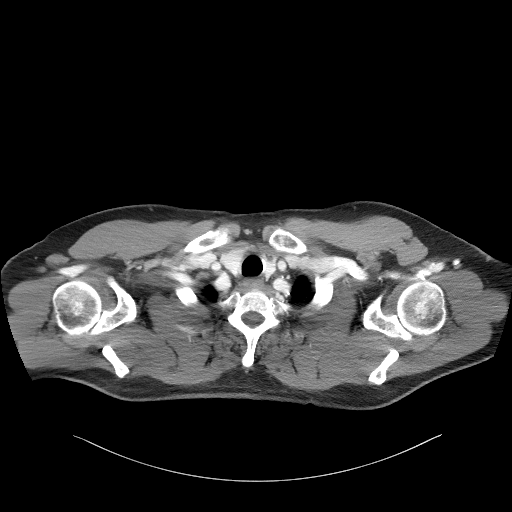

[Series 9: coronals · coronal · 0.83mm/px · 3 of 143 slices shown]
[im 36/143  soft-tissue]
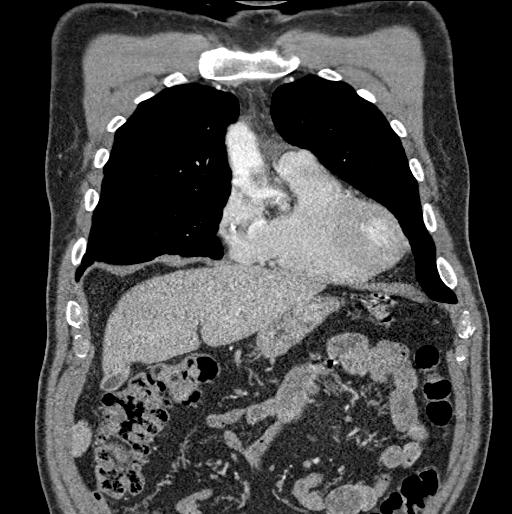
[im 72/143  soft-tissue]
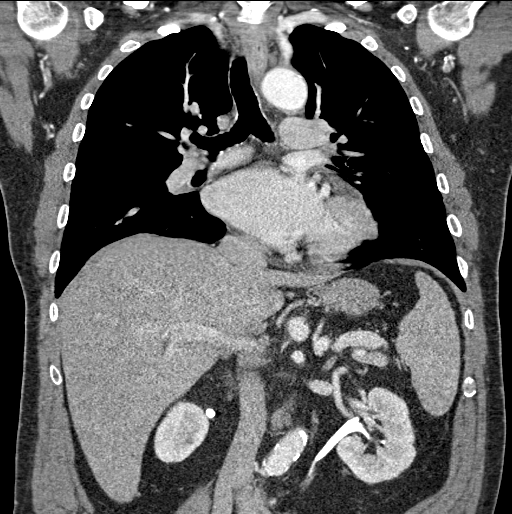
[im 107/143  soft-tissue]
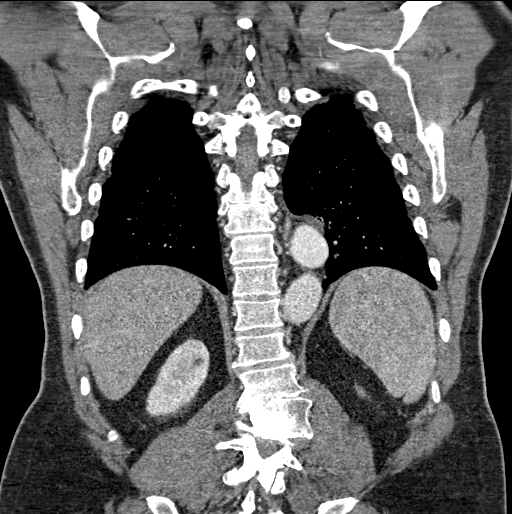

[15 of 46 positions shown; findings below may reference images not displayed]

FINDINGS: CTA CHEST FINDINGS

Cardiovascular: Interval increase in size of ascending thoracic
aortic aneurysm, now measuring 4.3 cm, previously 4.1 cm. No aortic
dissection. Trace pericardial fluid/thickening anteriorly,
unchanged. Three-vessel coronary artery atherosclerosis. No central
pulmonary embolism.

Mediastinum/Nodes: No enlarged mediastinal, hilar, or axillary lymph
nodes. Thyroid gland, trachea, and esophagus demonstrate no
significant findings.

Lungs/Pleura: Unchanged tiny loculated pleural effusion in the
medial aspect of the left hemithorax adjacent to the descending
thoracic aorta. No consolidation or pneumothorax. No suspicious
pulmonary nodule. Minimal bibasilar atelectasis.

Musculoskeletal: No chest wall abnormality. No acute or significant
osseous findings.

Review of the MIP images confirms the above findings.

CTA ABDOMEN FINDINGS

Aorta: Normal caliber aorta without aneurysm, dissection, vasculitis
or significant stenosis. Mild atherosclerotic calcification.

Celiac: Slightly increased ectasia of the proximal celiac artery,
measuring 1.9 cm, previously 1.6 cm. No dissection, vasculitis, or
significant stenosis.

SMA: Patent without evidence of aneurysm, dissection, vasculitis or
significant stenosis.

Renals: Both renal arteries are patent without evidence of aneurysm,
dissection, vasculitis, fibromuscular dysplasia or significant
stenosis.

IMA: Patent proximally without evidence of aneurysm, dissection,
vasculitis or significant stenosis.

Inflow: Patent proximal common iliac arteries without evidence of
aneurysm, dissection, vasculitis or significant stenosis.

Veins: No obvious venous abnormality within the limitations of this
arterial phase study.

Hepatobiliary: Tiny subcentimeter low-density lesion in the right
hepatic lobe remains too small to characterize, but is unchanged. No
new focal liver abnormality. The gallbladder is decompressed. No
biliary dilatation.

Pancreas: Unremarkable. No ductal dilatation or surrounding
inflammatory changes.

Spleen: Mildly enlarged, not significantly changed. No focal
abnormality.

Adrenals/Urinary Tract: The adrenal glands are unremarkable.
Subcentimeter low-density lesions in both kidneys remain too small
to characterize, but are unchanged. No hydronephrosis or renal
calculi.

Stomach/Bowel: The stomach is within normal limits. The visualized
bowel loops are unremarkable. The appendix is normal.

Lymphatic: No enlarged abdominal lymph nodes.

Musculoskeletal: No acute or significant osseous finding. Stable
severe degenerative disc disease at L2-L3 with 6 mm retrolisthesis.

Other: None.

Review of the MIP images confirms the above findings.
IMPRESSION: 1. Slight interval increase in size of ascending thoracic aortic
aneurysm, now measuring 4.3 cm, previously 4.1 cm. Recommend annual
imaging followup by CTA or MRA. This recommendation follows [EB]
ACCF/AHA/AATS/ACR/ASA/SCA/HEC/HEC/HEC/HEC Guidelines for the
Diagnosis and Management of Patients with Thoracic Aortic Disease.
Circulation. [EB]; 121: [id number]
2. Slight interval increase in ectasias of the proximal celiac
artery, now measuring 1.9 cm, previously 1.6 cm. Attention on
follow-up imaging is recommended.
3.  Aortic atherosclerosis ([EB]-[EB]).
4. Mild splenomegaly, unchanged.

## 2018-05-28 MED ORDER — IOPAMIDOL (ISOVUE-370) INJECTION 76%
100.0000 mL | Freq: Once | INTRAVENOUS | Status: AC | PRN
  Administered 2018-05-28: 100 mL via INTRAVENOUS

## 2018-05-30 ENCOUNTER — Telehealth: Payer: Self-pay

## 2018-05-30 DIAGNOSIS — I700 Atherosclerosis of aorta: Secondary | ICD-10-CM

## 2018-05-30 DIAGNOSIS — I719 Aortic aneurysm of unspecified site, without rupture: Secondary | ICD-10-CM

## 2018-05-30 NOTE — Telephone Encounter (Signed)
Notes recorded by Phineas Semen, RN on 05/30/2018 at 5:08 PM EDT Pt made aware of ct scan results. He request that I speak with his wife regarding results. I spoke with pt's wife and made her aware of results and Dr. Norris Cross recommendation to get a chest and abdomen MRI/MRA in 1 year and cardiac MRI. Pt is scheduled for FLP and ALT on 06/03/18 to assess LDL level. He stated understanding and thankful for the call   Notes recorded by Quintella Reichert, MD on 05/29/2018 at 2:41 PM EDT Please get an FLP and ALT. Patient's LDL was not at goal last fall. We will reassess and if greater than 70 will need be placed on statin therapy.   Notes recorded by Quintella Reichert, MD on 05/29/2018 at 2:40 PM EDT Mildly dilated a sending aortic aneurysm at 4.3 cm. Recommend follow-up with MRI/ MRA of the chest and abdomen in 1 year as well as cardiac MRI to assess follow-up on AI and a sending aortic aneurysm as well as celiac artery dilatation

## 2018-06-03 ENCOUNTER — Other Ambulatory Visit: Payer: PRIVATE HEALTH INSURANCE

## 2018-06-03 DIAGNOSIS — I719 Aortic aneurysm of unspecified site, without rupture: Secondary | ICD-10-CM

## 2018-06-03 LAB — LIPID PANEL
Chol/HDL Ratio: 4.6 ratio (ref 0.0–5.0)
Cholesterol, Total: 169 mg/dL (ref 100–199)
HDL: 37 mg/dL — ABNORMAL LOW (ref >39)
LDL Calculated: 109 mg/dL — ABNORMAL HIGH (ref 0–99)
Triglycerides: 116 mg/dL (ref 0–149)
VLDL Cholesterol Cal: 23 mg/dL (ref 5–40)

## 2018-06-03 LAB — ALT: ALT: 32 IU/L (ref 0–44)

## 2018-06-06 ENCOUNTER — Telehealth: Payer: Self-pay

## 2018-06-06 DIAGNOSIS — E785 Hyperlipidemia, unspecified: Secondary | ICD-10-CM

## 2018-06-06 MED ORDER — ATORVASTATIN CALCIUM 10 MG PO TABS: 10.0000 mg | ORAL_TABLET | Freq: Every day | ORAL | 11 refills | Status: DC

## 2018-06-06 NOTE — Telephone Encounter (Signed)
Notes recorded by Phineas Semen, RN on 06/06/2018 at 9:28 AM EDT Pt made aware of lab results. Per Dr. Mayford Knife LDL goal < 70. Pt instructed to Lipitor 10mg  daily and repeat FLP and ALT in 6 weeks. He is in agreement with plan, scheduled for repeat labs on 08/04/18. He stated understanding and thankful for the call  Notes recorded by 08/06/18, MD on 06/04/2018 at 9:55 PM EDT LDL goal < 70. Please start Lipitor 10mg  daily and repeat FLP and ALT in 6 weeks

## 2018-06-17 ENCOUNTER — Encounter: Payer: Self-pay | Admitting: Cardiology

## 2018-07-03 ENCOUNTER — Telehealth: Payer: Self-pay | Admitting: Cardiology

## 2018-07-03 NOTE — Telephone Encounter (Signed)
Tried to contact patient several times for event monitor with no response back mailed letter 06/17/18 -taking order out of system  04/15/18 Called and spoke with patient and he stated that he did not want to be concerned about this test while he is on vacation 5/10-5/19 and would prefer for someone to call him on 5/20 to schedule appt for that week...RG 06-05-18 called to resc monitor, pt states that he will call back on 06-09-18 to resc monitor.dp 06/17/18 LETTER MAILED/D.MILLER

## 2018-07-03 NOTE — Telephone Encounter (Signed)
Left message to call back  

## 2018-07-08 NOTE — Telephone Encounter (Signed)
Letter sent 7/9.

## 2018-07-09 ENCOUNTER — Telehealth: Payer: Self-pay | Admitting: Cardiology

## 2018-07-09 NOTE — Telephone Encounter (Signed)
Follow up:  Patient returning your call, to schedule a event monitor.

## 2018-07-10 ENCOUNTER — Other Ambulatory Visit: Payer: Self-pay

## 2018-07-10 DIAGNOSIS — R002 Palpitations: Secondary | ICD-10-CM

## 2018-07-10 NOTE — Telephone Encounter (Signed)
Follow up    A new order will need to be put in , the old order was cancelled due to lack of response and patient not wanting to have it done.

## 2018-07-10 NOTE — Telephone Encounter (Signed)
Order placed, sent to [facility] to schedule.

## 2018-07-11 ENCOUNTER — Ambulatory Visit (INDEPENDENT_AMBULATORY_CARE_PROVIDER_SITE_OTHER): Payer: No Typology Code available for payment source

## 2018-07-11 DIAGNOSIS — R002 Palpitations: Secondary | ICD-10-CM

## 2018-07-31 ENCOUNTER — Ambulatory Visit: Payer: Self-pay | Admitting: *Deleted

## 2018-07-31 DIAGNOSIS — E7800 Pure hypercholesterolemia, unspecified: Secondary | ICD-10-CM

## 2018-07-31 DIAGNOSIS — Z8639 Personal history of other endocrine, nutritional and metabolic disease: Secondary | ICD-10-CM

## 2018-07-31 DIAGNOSIS — Z79899 Other long term (current) drug therapy: Secondary | ICD-10-CM

## 2018-07-31 NOTE — Progress Notes (Signed)
Cardiology lab orders drawn in clinic per pt request. Lipid panel and ALT ordered for evaluation of Lipitor 10mg  daily that he initiated 8 weeks ago.  BMET added for K+ check per [facility] NP Betancourt.

## 2018-08-01 LAB — BASIC METABOLIC PANEL
BUN/Creatinine Ratio: 15 (ref 10–24)
BUN: 15 mg/dL (ref 8–27)
CO2: 19 mmol/L — ABNORMAL LOW (ref 20–29)
Calcium: 9.6 mg/dL (ref 8.6–10.2)
Chloride: 102 mmol/L (ref 96–106)
Creatinine, Ser: 1.02 mg/dL (ref 0.76–1.27)
GFR calc Af Amer: 91 mL/min/{1.73_m2} (ref >59)
GFR calc non Af Amer: 78 mL/min/{1.73_m2} (ref >59)
Glucose: 80 mg/dL (ref 65–99)
Potassium: 3.9 mmol/L (ref 3.5–5.2)
Sodium: 139 mmol/L (ref 134–144)

## 2018-08-01 LAB — LIPID PANEL
Chol/HDL Ratio: 3.8 ratio (ref 0.0–5.0)
Cholesterol, Total: 156 mg/dL (ref 100–199)
HDL: 41 mg/dL (ref >39)
LDL Calculated: 90 mg/dL (ref 0–99)
Triglycerides: 127 mg/dL (ref 0–149)
VLDL Cholesterol Cal: 25 mg/dL (ref 5–40)

## 2018-08-01 LAB — ALT: ALT: 24 IU/L (ref 0–44)

## 2018-08-04 ENCOUNTER — Other Ambulatory Visit: Payer: Self-pay

## 2018-08-04 ENCOUNTER — Telehealth: Payer: Self-pay

## 2018-08-04 MED ORDER — ATORVASTATIN CALCIUM 20 MG PO TABS: 20.0000 mg | ORAL_TABLET | Freq: Every day | ORAL | 3 refills | Status: DC

## 2018-08-04 NOTE — Progress Notes (Signed)
Left mesg on pt's individual work voicemail that new Rx had been placed for increased dose of Lipitor and refill will be available for pick up in [facility] clinic Tuesday morning. Appt made for 6 weeks out to repeat lipids, ALT in clinic and emailed to pt's calendar. Instructed pt to call or come by office with any questions or concerns.

## 2018-08-04 NOTE — Telephone Encounter (Signed)
Called patient back about lab results. Per Dr. Mayford Knife, LDL goal < 70 with dilated aorta - increase lipitor to 20mg  daily and repeat FLP and ALT in 6 weeks. Patient verbalized understanding. Patient will have lab work done with [facility] and increase lipitor to 20 mg daily.

## 2018-08-04 NOTE — Telephone Encounter (Signed)
-----   Message from Quintella Reichert, MD sent at 08/01/2018  5:52 PM EDT ----- LDL goal < 70 with dilated aorta - increase lipitor to 20mg  daily and repeat FLP and ALT in 6 weeks.

## 2018-08-13 ENCOUNTER — Telehealth: Payer: Self-pay

## 2018-08-13 NOTE — Telephone Encounter (Signed)
Patient is inquiring about the cost of the 24 hr Holter monitor.  His insurance company told him that they will cover 80% of the cost.  Please call patient, thank you.

## 2018-08-13 NOTE — Telephone Encounter (Signed)
Spoke to patient in regards to his Event Monitor.  It was suggested by Dr Mayford Knife that he wear a 24 hr Holter Monitor.  The patient is reluctant because of cost.  I told him to call his insurance company to check the cost and call us if interested.  I told him that the event monitor showed PVCs so the Holter monitor is important.  He verbalized understanding.

## 2018-08-14 ENCOUNTER — Telehealth: Payer: Self-pay | Admitting: Cardiology

## 2018-08-14 DIAGNOSIS — I493 Ventricular premature depolarization: Secondary | ICD-10-CM

## 2018-08-14 NOTE — Telephone Encounter (Signed)
New message   Patient is calling to place an order for a 24 hour monitor and need to know the cost of the monitor. Please call to discuss.

## 2018-08-14 NOTE — Telephone Encounter (Signed)
No message needed °

## 2018-08-18 ENCOUNTER — Ambulatory Visit (INDEPENDENT_AMBULATORY_CARE_PROVIDER_SITE_OTHER): Payer: No Typology Code available for payment source

## 2018-08-18 DIAGNOSIS — I493 Ventricular premature depolarization: Secondary | ICD-10-CM | POA: Diagnosis not present

## 2018-08-25 ENCOUNTER — Telehealth: Payer: Self-pay

## 2018-08-25 DIAGNOSIS — I493 Ventricular premature depolarization: Secondary | ICD-10-CM

## 2018-08-25 NOTE — Telephone Encounter (Signed)
-----   Message from Lars Masson, MD sent at 08/23/2018  8:42 PM EDT ----- Please refer to EP, symptomatic palpitations with very frequent PVCs - 22% of all the beats

## 2018-08-25 NOTE — Telephone Encounter (Signed)
-----   Message from Katarina H Nelson, MD sent at 08/23/2018  8:42 PM EDT ----- Please refer to EP, symptomatic palpitations with very frequent PVCs - 22% of all the beats 

## 2018-08-25 NOTE — Telephone Encounter (Signed)
Notes recorded by Sigurd Sos, RN on 08/25/2018 at 8:34 AM EDT Informed patient of results/recommendations. I put a referral into EP and patient verbalized understanding.

## 2018-09-04 ENCOUNTER — Ambulatory Visit (INDEPENDENT_AMBULATORY_CARE_PROVIDER_SITE_OTHER): Payer: No Typology Code available for payment source | Admitting: Internal Medicine

## 2018-09-04 ENCOUNTER — Encounter: Payer: Self-pay | Admitting: Internal Medicine

## 2018-09-04 ENCOUNTER — Encounter

## 2018-09-04 VITALS — BP 114/74 | Ht 71.0 in | Wt 250.4 lb

## 2018-09-04 DIAGNOSIS — I493 Ventricular premature depolarization: Secondary | ICD-10-CM

## 2018-09-04 MED ORDER — FLECAINIDE ACETATE 100 MG PO TABS: 100.0000 mg | ORAL_TABLET | Freq: Two times a day (BID) | ORAL | 11 refills | Status: DC

## 2018-09-04 NOTE — Progress Notes (Signed)
HPI [name] is referred today by Dr. Mayford Knife for evaluation of PVC's. He is a pleasant middle aged man from [place] who wore a cardiac moniter and was noted to have 21K PVC's in 24 hours. He has minimal symptoms of palpitations but feels tired and has a reduction in energy. He is on multiple meds. He has been on atenolol despite having the PVC's. Allergies  Allergen Reactions  . Amlodipine Besylate Swelling    Leg swelling with 10mg  daily am dosing; decreased with BID 5mg  dosing  . Aspirin Itching     Current Outpatient Medications  Medication Sig Dispense Refill  . atorvastatin (LIPITOR) 20 MG tablet Take 1 tablet (20 mg total) by mouth daily. 90 tablet 3  . colchicine 0.6 MG tablet Take 1-2 tablets by mouth daily.    . cyclobenzaprine (FLEXERIL) 5 MG tablet Take 5 mg by mouth.    . diclofenac (VOLTAREN) 75 MG EC tablet Take 1 tablet (75 mg total) by mouth 2 (two) times daily. 60 tablet 0  . gabapentin (NEURONTIN) 300 MG capsule Take 1 capsule by mouth 3 (three) times daily.    . magnesium oxide (MAG-OX) 400 MG tablet Take 1 tablet by mouth daily.    . montelukast (SINGULAIR) 10 MG tablet Take 1 tablet (10 mg total) by mouth at bedtime. 90 tablet 3  . sildenafil (REVATIO) 20 MG tablet Take by mouth.    amLODipine (NORVASC) 10 MG tablet Take 1 tablet (10 mg total) by mouth daily. 30 tablet 0  . atenolol (TENORMIN) 25 MG tablet Take 1 tablet (25 mg total) by mouth 2 (two) times daily. 60 tablet 0  . fluticasone (FLONASE) 50 MCG/ACT nasal spray Place 1 spray into both nostrils 2 (two) times daily. 16 g 6  . furosemide (LASIX) 40 MG tablet Take 1 tablet (40 mg total) by mouth daily. 30 tablet 0  . losartan-hydrochlorothiazide (HYZAAR) 100-25 MG tablet Take 1 tablet by mouth daily.    . Potassium Chloride ER 20 MEQ TBCR Take 2 tablets by mouth 2 (two) times daily. 120 tablet 0  . sodium chloride (OCEAN) 0.65 % SOLN nasal spray Place 2 sprays into both nostrils every 2 (two) hours  while awake.  0   No current facility-administered medications for this visit.      Past Medical History:  Diagnosis Date  . Dilated aortic root (HCC)    32mm aortic root and 87mm ascending aorta by echo 04/2018  . Umbilical hernia     ROS:   All systems reviewed and negative except as noted in the HPI.   History reviewed. No pertinent surgical history.   History reviewed. No pertinent family history.   Social History   Socioeconomic History  . Marital status: Single    Spouse name: Not on file  . Number of children: Not on file  . Years of education: Not on file  . Highest education level: Not on file  Occupational History  . Not on file  Social Needs  . Financial resource strain: Not on file  . Food insecurity:    Worry: Not on file    Inability: Not on file  . Transportation needs:    Medical: Not on file    Non-medical: Not on file  Tobacco Use  . Smoking status: Never Smoker  . Smokeless tobacco: Never Used  Substance and Sexual Activity  . Alcohol use: Yes  . Drug use: Never  . Sexual activity: Not on  file  Lifestyle  . Physical activity:    Days per week: Not on file    Minutes per session: Not on file  . Stress: Not on file  Relationships  . Social connections:    Talks on phone: Not on file    Gets together: Not on file    Attends religious service: Not on file    Active member of club or organization: Not on file    Attends meetings of clubs or organizations: Not on file    Relationship status: Not on file  . Intimate partner violence:    Fear of current or ex partner: Not on file    Emotionally abused: Not on file    Physically abused: Not on file    Forced sexual activity: Not on file  Other Topics Concern  . Not on file  Social History Narrative  . Not on file     BP 114/74   Ht 5\' 11"  (1.803 m)   Wt 250 lb 6.4 oz (113.6 kg)   SpO2 97%   BMI 34.92 kg/m   Physical Exam:  Well appearing 62 yo man, NAD HEENT:  Unremarkable Neck:  6 cm, JVD, no thyromegally Lymphatics:  No adenopathy Back:  No CVA tenderness Lungs:  Clear with no wheezes HEART:  Regular rate rhythm, no murmurs, no rubs, no clicks Abd:  soft, positive bowel sounds, no organomegally, no rebound, no guarding Ext:  2 plus pulses, no edema, no cyanosis, no clubbing Skin:  No rashes no nodules Neuro:  CN II through XII intact, motor grossly intact  EKG - nsr with PVC's  Assess/Plan: 1. PVC's - I have offered him a trial of flecainide vs catheter ablation. The PVC's appear to be originating near the lateral wall of the inferior apex of the RV.  2. HTN - his blood pressure is controlled.  3. Fatigue - he has preserved LV function. I suspect his symptoms are related to the PVC's but hard to know for sure.  68.D.

## 2018-09-04 NOTE — Patient Instructions (Addendum)
Medication Instructions:  Your physician has recommended you make the following change in your medication:   1.  Start taking flecainide 100 mg --one tablet by mouth twice a day   Labwork:  You will return to [facility] office in 7 days for a 12 lead EKG nurse visit.  Testing/Procedures: Your physician has recommended that you have an ablation. Catheter ablation is a medical procedure used to treat some cardiac arrhythmias (irregular heartbeats). During catheter ablation, a long, thin, flexible tube is put into a blood vessel in your groin (upper thigh), or neck. This tube is called an ablation catheter. It is then guided to your heart through the blood vessel. Radio frequency waves destroy small areas of heart tissue where abnormal heartbeats may cause an arrhythmia to start. Please see the instruction sheet given to you today.  The following days are available for procedures:  If you decide on a day please give me a call:  Dierdre Highman [phone]  Ablation days:  October 4, 7, 28 and 29 November 4, 15, 18, 25    Cardiac Ablation Cardiac ablation is a procedure to disable (ablate) a small amount of heart tissue in very specific places. The heart has many electrical connections. Sometimes these connections are abnormal and can cause the heart to beat very fast or irregularly. Ablating some of the problem areas can improve the heart rhythm or return it to normal. Ablation may be done for people who:  Have Wolff-Parkinson-White syndrome.  Have fast heart rhythms (tachycardia).  Have taken medicines for an abnormal heart rhythm (arrhythmia) that were not effective or caused side effects.  Have a high-risk heartbeat that may be life-threatening.  During the procedure, a small incision is made in the neck or the groin, and a long, thin, flexible tube (catheter) is inserted into the incision and moved to the heart. Small devices (electrodes) on the tip of the catheter will send out  electrical currents. A type of X-ray (fluoroscopy) will be used to help guide the catheter and to provide images of the heart. Tell a health care provider about:  Any allergies you have.  All medicines you are taking, including vitamins, herbs, eye drops, creams, and over-the-counter medicines.  Any problems you or family members have had with anesthetic medicines.  Any blood disorders you have.  Any surgeries you have had.  Any medical conditions you have, such as kidney failure.  Whether you are pregnant or may be pregnant. What are the risks? Generally, this is a safe procedure. However, problems may occur, including:  Infection.  Bruising and bleeding at the catheter insertion site.  Bleeding into the chest, especially into the sac that surrounds the heart. This is a serious complication.  Stroke or blood clots.  Damage to other structures or organs.  Allergic reaction to medicines or dyes.  Need for a permanent pacemaker if the normal electrical system is damaged. A pacemaker is a small computer that sends electrical signals to the heart and helps your heart beat normally.  The procedure not being fully effective. This may not be recognized until months later. Repeat ablation procedures are sometimes required.  What happens before the procedure?  Follow instructions from your health care provider about eating or drinking restrictions.  Ask your health care provider about: ? Changing or stopping your regular medicines. This is especially important if you are taking diabetes medicines or blood thinners. ? Taking medicines such as aspirin and ibuprofen. These medicines can thin your blood. Do  not take these medicines before your procedure if your health care provider instructs you not to.  Plan to have someone take you home from the hospital or clinic.  If you will be going home right after the procedure, plan to have someone with you for 24 hours. What happens during  the procedure?  To lower your risk of infection: ? Your health care team will wash or sanitize their hands. ? Your skin will be washed with soap. ? Hair may be removed from the incision area.  An IV tube will be inserted into one of your veins.  You will be given a medicine to help you relax (sedative).  The skin on your neck or groin will be numbed.  An incision will be made in your neck or your groin.  A needle will be inserted through the incision and into a large vein in your neck or groin.  A catheter will be inserted into the needle and moved to your heart.  Dye may be injected through the catheter to help your surgeon see the area of the heart that needs treatment.  Electrical currents will be sent from the catheter to ablate heart tissue in desired areas. There are three types of energy that may be used to ablate heart tissue: ? Heat (radiofrequency energy). ? Laser energy. ? Extreme cold (cryoablation).  When the necessary tissue has been ablated, the catheter will be removed.  Pressure will be held on the catheter insertion area to prevent excessive bleeding.  A bandage (dressing) will be placed over the catheter insertion area. The procedure may vary among health care providers and hospitals. What happens after the procedure?  Your blood pressure, heart rate, breathing rate, and blood oxygen level will be monitored until the medicines you were given have worn off.  Your catheter insertion area will be monitored for bleeding. You will need to lie still for a few hours to ensure that you do not bleed from the catheter insertion area.  Do not drive for 24 hours or as long as directed by your health care provider. Summary  Cardiac ablation is a procedure to disable (ablate) a small amount of heart tissue in very specific places. Ablating some of the problem areas can improve the heart rhythm or return it to normal.  During the procedure, electrical currents will be  sent from the catheter to ablate heart tissue in desired areas. This information is not intended to replace advice given to you by your health care provider. Make sure you discuss any questions you have with your health care provider. Document Released: 04/14/2009 Document Revised: 10/15/2016 Document Reviewed: 10/15/2016 Elsevier Interactive Patient Education  2018 [employer].   Flecainide tablets What is this medicine? FLECAINIDE (FLEK a nide) is an antiarrhythmic drug. This medicine is used to prevent irregular heart rhythm. It can also slow down fast heartbeats called tachycardia. This medicine may be used for other purposes; ask your health care provider or pharmacist if you have questions. COMMON BRAND NAME(S): Tambocor What should I tell my health care provider before I take this medicine? They need to know if you have any of these conditions: -abnormal levels of potassium in the blood -heart disease including heart rhythm and heart rate problems -kidney or liver disease -recent heart attack -an unusual or allergic reaction to flecainide, local anesthetics, other medicines, foods, dyes, or preservatives -pregnant or trying to get pregnant -breast-feeding How should I use this medicine? Take this medicine by mouth with a  glass of water. Follow the directions on the prescription label. You can take this medicine with or without food. Take your doses at regular intervals. Do not take your medicine more often than directed. Do not stop taking this medicine suddenly. This may cause serious, heart-related side effects. If your doctor wants you to stop the medicine, the dose may be slowly lowered over time to avoid any side effects. Talk to your pediatrician regarding the use of this medicine in children. While this drug may be prescribed for children as young as 1 year of age for selected conditions, precautions do apply. Overdosage: If you think you have taken too much of this medicine  contact a poison control center or emergency room at once. NOTE: This medicine is only for you. Do not share this medicine with others. What if I miss a dose? If you miss a dose, take it as soon as you can. If it is almost time for your next dose, take only that dose. Do not take double or extra doses. What may interact with this medicine? Do not take this medicine with any of the following medications: -amoxapine -arsenic trioxide -certain antibiotics like clarithromycin, erythromycin, gatifloxacin, gemifloxacin, levofloxacin, moxifloxacin, sparfloxacin, or troleandomycin -certain antidepressants called tricyclic antidepressants like amitriptyline, imipramine, or nortriptyline -certain medicines to control heart rhythm like disopyramide, dofetilide, encainide, moricizine, procainamide, propafenone, and quinidine -cisapride -cyclobenzaprine -delavirdine -droperidol -haloperidol -hawthorn -imatinib -levomethadyl -maprotiline -medicines for malaria like chloroquine and halofantrine -pentamidine -phenothiazines like chlorpromazine, mesoridazine, prochlorperazine, thioridazine -pimozide -quinine -ranolazine -ritonavir -sertindole -ziprasidone This medicine may also interact with the following medications: -cimetidine -medicines for angina or high blood pressure -medicines to control heart rhythm like amiodarone and digoxin This list may not describe all possible interactions. Give your health care provider a list of all the medicines, herbs, non-prescription drugs, or dietary supplements you use. Also tell them if you smoke, drink alcohol, or use illegal drugs. Some items may interact with your medicine. What should I watch for while using this medicine? Visit your doctor or health care professional for regular checks on your progress. Because your condition and the use of this medicine carries some risk, it is a good idea to carry an identification card, necklace or bracelet with  details of your condition, medications and doctor or health care professional. Check your blood pressure and pulse rate regularly. Ask your health care professional what your blood pressure and pulse rate should be, and when you should contact him or her. Your doctor or health care professional also may schedule regular blood tests and electrocardiograms to check your progress. You may get drowsy or dizzy. Do not drive, use machinery, or do anything that needs mental alertness until you know how this medicine affects you. Do not stand or sit up quickly, especially if you are an older patient. This reduces the risk of dizzy or fainting spells. Alcohol can make you more dizzy, increase flushing and rapid heartbeats. Avoid alcoholic drinks. What side effects may I notice from receiving this medicine? Side effects that you should report to your doctor or health care professional as soon as possible: -chest pain, continued irregular heartbeats -difficulty breathing -swelling of the legs or feet -trembling, shaking -unusually weak or tired Side effects that usually do not require medical attention (report to your doctor or health care professional if they continue or are bothersome): -blurred vision -constipation -headache -nausea, vomiting -stomach pain This list may not describe all possible side effects. Call your doctor for medical advice about  side effects. You may report side effects to FDA at 1-800-FDA-1088. Where should I keep my medicine? Keep out of the reach of children. Store at room temperature between 15 and 30 degrees C (59 and 86 degrees F). Protect from light. Keep container tightly closed. Throw away any unused medicine after the expiration date. NOTE: This sheet is a summary. It may not cover all possible information. If you have questions about this medicine, talk to your doctor, pharmacist, or health care provider.  2018 Elsevier/Gold Standard (2008-03-31 16:46:09)

## 2018-09-11 ENCOUNTER — Ambulatory Visit: Payer: Self-pay

## 2018-09-16 ENCOUNTER — Ambulatory Visit (INDEPENDENT_AMBULATORY_CARE_PROVIDER_SITE_OTHER): Payer: No Typology Code available for payment source

## 2018-09-16 VITALS — HR 67 | Ht 71.0 in | Wt 250.8 lb

## 2018-09-16 DIAGNOSIS — I493 Ventricular premature depolarization: Secondary | ICD-10-CM

## 2018-09-16 NOTE — Patient Instructions (Signed)
Medication Instructions:  Your physician recommends that you continue on your current medications as directed. Please refer to the Current Medication list given to you today.  Labwork: None ordered.  Testing/Procedures: None ordered.  Follow-Up: Will discuss EKG results with Dr. Ladona Ridgel and call you 09/17/2018.  Any Other Special Instructions Will Be Listed Below (If Applicable).  If you need a refill on your cardiac medications before your next appointment, please call your pharmacy.

## 2018-09-16 NOTE — Progress Notes (Signed)
1.) Reason for visit: EKG for initiation of flecainide 100 mg BID for PVC's  2.) Name of MD requesting visit: Taylor  3.) H&P: Pt started flecainide 100 mg bid 1 week ago for pvc burden.  Pt states first few days on flecainide he felt like he had more energy, but after that he returned to feeling fatigued (how he felt before flecainide)  4.) ROS related to problem:  Repeat EKG today 09/16/2018 shows patient with PVC's in pattern of bigeminy  5.) Assessment and plan per MD: Dr. Katrinka Blazing reviewed EKG.  Nothing to be done today.  Will discuss continued plan of care with Dr. Ladona Ridgel 09/17/2018 and then return call to patient.

## 2018-09-17 ENCOUNTER — Telehealth: Payer: Self-pay

## 2018-09-17 DIAGNOSIS — I493 Ventricular premature depolarization: Secondary | ICD-10-CM

## 2018-09-17 NOTE — Telephone Encounter (Signed)
Call placed to Pt.  Dr. Ladona Ridgel reviewed nurse visit EKG s/p flecainide start.  Per Dr. Richardine Service Pt discontinue flecainide. At this time Dr. Ladona Ridgel would recommend Pt go forward with ablation.  Pt questioned whether there were other medications he could try?  Advised there were but all came with serious risks, side effects and would require another office visit to discuss.  Per Pt copay for additional office visit is prohibitive.  Pt asks when Dr. Ladona Ridgel has next available.  Advised next available procedure day is 10/07/2018.  Pt to discuss with wife.    Will call Pt Friday AM to follow up.

## 2018-09-18 ENCOUNTER — Ambulatory Visit: Payer: Self-pay | Admitting: *Deleted

## 2018-09-18 DIAGNOSIS — Z79899 Other long term (current) drug therapy: Secondary | ICD-10-CM

## 2018-09-18 DIAGNOSIS — E7800 Pure hypercholesterolemia, unspecified: Secondary | ICD-10-CM

## 2018-09-18 NOTE — Progress Notes (Signed)
Labs drawn per order after 07/31/18 draw to increase Lipitor dose and repeat labs in 6 weeks. Will CC ordering provider on results and contact pt via phone upon receipt.

## 2018-09-19 LAB — SPECIMEN STATUS

## 2018-09-19 LAB — LIPID PANEL
Chol/HDL Ratio: 3.3 ratio (ref 0.0–5.0)
Cholesterol, Total: 124 mg/dL (ref 100–199)
HDL: 38 mg/dL — ABNORMAL LOW (ref >39)
LDL Calculated: 61 mg/dL (ref 0–99)
Triglycerides: 126 mg/dL (ref 0–149)
VLDL Cholesterol Cal: 25 mg/dL (ref 5–40)

## 2018-09-19 LAB — ALT: ALT: 28 IU/L (ref 0–44)

## 2018-09-19 NOTE — Progress Notes (Signed)
Reviewed results with pt via phone. Informed pt that LDL is now at goal of <70 and ALT is WNL. He is very pleased about this. Advised him that cardiology office will call with any recommendations or further instructions. He also reports that after discussing with his spouse, he has decided to proceed with the PVC ablation and is expecting a call back today to confirm that scheduling. Encouraged pt to call or f/u in clinic with any further questions/concerns.

## 2018-09-19 NOTE — Telephone Encounter (Signed)
Call returned to Pt.  Pt would like to go ahead and schedule PVC ablation for 10/29.  Will come to office on 09/25/2018 for lab work and to discuss instructions.  Will cont to monitor.

## 2018-09-22 NOTE — Progress Notes (Signed)
Pt advised of Dr. Norris Cross notes to continue current therapy. Results routed to PCP with detail that additional labs can be viewed under Care Everywhere-[place].

## 2018-09-23 ENCOUNTER — Telehealth: Payer: Self-pay | Admitting: Registered Nurse

## 2018-09-23 ENCOUNTER — Encounter: Payer: Self-pay | Admitting: Registered Nurse

## 2018-09-23 MED ORDER — ATENOLOL 25 MG PO TABS: 25.0000 mg | ORAL_TABLET | Freq: Two times a day (BID) | ORAL | 0 refills | Status: DC

## 2018-09-23 MED ORDER — AMLODIPINE BESYLATE 10 MG PO TABS: 10.0000 mg | ORAL_TABLET | Freq: Every day | ORAL | 0 refills | Status: DC

## 2018-09-23 MED ORDER — FUROSEMIDE 40 MG PO TABS: 40.0000 mg | ORAL_TABLET | Freq: Every day | ORAL | 0 refills | Status: DC

## 2018-09-23 MED ORDER — POTASSIUM CHLORIDE ER 20 MEQ PO TBCR: 2.0000 | EXTENDED_RELEASE_TABLET | Freq: Two times a day (BID) | ORAL | 0 refills | Status: DC

## 2018-09-23 MED ORDER — MONTELUKAST SODIUM 10 MG PO TABS: 10.0000 mg | ORAL_TABLET | Freq: Every day | ORAL | 3 refills | Status: DC

## 2018-09-23 MED ORDER — FLUTICASONE PROPIONATE 50 MCG/ACT NA SUSP: 1.0000 | Freq: Two times a day (BID) | NASAL | 6 refills | Status: DC

## 2018-09-23 MED ORDER — SALINE SPRAY 0.65 % NA SOLN: 2.0000 | NASAL | 0 refills | Status: DC

## 2018-09-23 MED ORDER — LOSARTAN POTASSIUM-HCTZ 100-25 MG PO TABS: 1.0000 | ORAL_TABLET | Freq: Every day | ORAL | 0 refills | Status: DC

## 2018-09-23 NOTE — Telephone Encounter (Signed)
Bridge refills of amlodipine 10mg  po daily #90 RF0; atenolol 25mg  po BID #60 RF0, potassium chloride take 2 po BID #120 RF0 and furosemide 40mg  po daily #30 RF0 dispensed from [facility] to patient from [facility] formulary; electronic Rx flonase 1 spray each nostril BID #1 RF6, nasal saline, singulair 10mg  po qhs #90 RF3 and losartan hydrochlorothiazide 100/25mg  po daily #30 RF0 sent to patient pharmacy of choice [facility] and he must pick up.  Patient to get next month supply from cardiology after ablation completed and verified if any dosing changes s/p ablation.  Patient verbalized understanding information/instructions, agreed with plan of care and had no further questions at this time.

## 2018-09-23 NOTE — Telephone Encounter (Signed)
Workup completed

## 2018-09-25 ENCOUNTER — Other Ambulatory Visit: Payer: PRIVATE HEALTH INSURANCE | Admitting: *Deleted

## 2018-09-25 DIAGNOSIS — I493 Ventricular premature depolarization: Secondary | ICD-10-CM

## 2018-09-26 LAB — BASIC METABOLIC PANEL
BUN/Creatinine Ratio: 15 (ref 10–24)
BUN: 19 mg/dL (ref 8–27)
CO2: 24 mmol/L (ref 20–29)
Calcium: 9.4 mg/dL (ref 8.6–10.2)
Chloride: 101 mmol/L (ref 96–106)
Creatinine, Ser: 1.31 mg/dL — ABNORMAL HIGH (ref 0.76–1.27)
GFR calc Af Amer: 67 mL/min/{1.73_m2} (ref >59)
GFR calc non Af Amer: 58 mL/min/{1.73_m2} — ABNORMAL LOW (ref >59)
Glucose: 77 mg/dL (ref 65–99)
Potassium: 3.7 mmol/L (ref 3.5–5.2)
Sodium: 141 mmol/L (ref 134–144)

## 2018-09-26 LAB — CBC WITH DIFFERENTIAL/PLATELET
Basophils Absolute: 0.1 10*3/uL (ref 0.0–0.2)
Basos: 1 %
EOS (ABSOLUTE): 0.1 10*3/uL (ref 0.0–0.4)
Eos: 2 %
Hematocrit: 40.4 % (ref 37.5–51.0)
Hemoglobin: 13.9 g/dL (ref 13.0–17.7)
Immature Grans (Abs): 0 10*3/uL (ref 0.0–0.1)
Immature Granulocytes: 0 %
Lymphocytes Absolute: 1.6 10*3/uL (ref 0.7–3.1)
Lymphs: 29 %
MCH: 32.2 pg (ref 26.6–33.0)
MCHC: 34.4 g/dL (ref 31.5–35.7)
MCV: 94 fL (ref 79–97)
Monocytes Absolute: 0.5 10*3/uL (ref 0.1–0.9)
Monocytes: 9 %
Neutrophils Absolute: 3.2 10*3/uL (ref 1.4–7.0)
Neutrophils: 59 %
Platelets: 179 10*3/uL (ref 150–450)
RBC: 4.32 x10E6/uL (ref 4.14–5.80)
RDW: 12.8 % (ref 12.3–15.4)
WBC: 5.4 10*3/uL (ref 3.4–10.8)

## 2018-10-07 ENCOUNTER — Other Ambulatory Visit: Payer: Self-pay

## 2018-10-07 ENCOUNTER — Ambulatory Visit (HOSPITAL_COMMUNITY)
Admission: RE | Admit: 2018-10-07 | Discharge: 2018-10-07 | Disposition: A | Discharge status: Home / Self Care | Payer: No Typology Code available for payment source | Source: Ambulatory Visit | Attending: Internal Medicine | Admitting: Internal Medicine

## 2018-10-07 ENCOUNTER — Encounter (HOSPITAL_COMMUNITY)
Admission: RE | Disposition: A | Discharge status: Home / Self Care | Payer: Self-pay | Source: Ambulatory Visit | Attending: Internal Medicine

## 2018-10-07 DIAGNOSIS — Z79899 Other long term (current) drug therapy: Secondary | ICD-10-CM | POA: Diagnosis not present

## 2018-10-07 DIAGNOSIS — R5383 Other fatigue: Secondary | ICD-10-CM | POA: Diagnosis not present

## 2018-10-07 DIAGNOSIS — R002 Palpitations: Secondary | ICD-10-CM | POA: Diagnosis present

## 2018-10-07 DIAGNOSIS — I10 Essential (primary) hypertension: Secondary | ICD-10-CM | POA: Diagnosis not present

## 2018-10-07 DIAGNOSIS — Z7951 Long term (current) use of inhaled steroids: Secondary | ICD-10-CM | POA: Insufficient documentation

## 2018-10-07 DIAGNOSIS — I493 Ventricular premature depolarization: Secondary | ICD-10-CM | POA: Insufficient documentation

## 2018-10-07 DIAGNOSIS — Z886 Allergy status to analgesic agent status: Secondary | ICD-10-CM | POA: Insufficient documentation

## 2018-10-07 DIAGNOSIS — Z888 Allergy status to other drugs, medicaments and biological substances status: Secondary | ICD-10-CM | POA: Insufficient documentation

## 2018-10-07 HISTORY — PX: PVC ABLATION: EP1236

## 2018-10-07 SURGERY — PVC ABLATION
Anesthesia: LOCAL

## 2018-10-07 MED ORDER — SODIUM CHLORIDE 0.9% FLUSH: 3.0000 mL | INTRAVENOUS | Status: DC | PRN

## 2018-10-07 MED ORDER — BUPIVACAINE HCL (PF) 0.25 % IJ SOLN
INTRAMUSCULAR | Status: AC
  Filled 2018-10-07: qty 30

## 2018-10-07 MED ORDER — MIDAZOLAM HCL 5 MG/5ML IJ SOLN
INTRAMUSCULAR | Status: AC
  Filled 2018-10-07: qty 5

## 2018-10-07 MED ORDER — HEPARIN (PORCINE) IN NACL 1000-0.9 UT/500ML-% IV SOLN
INTRAVENOUS | Status: DC | PRN
  Administered 2018-10-07: 500 mL

## 2018-10-07 MED ORDER — MIDAZOLAM HCL 5 MG/5ML IJ SOLN
INTRAMUSCULAR | Status: DC | PRN
  Administered 2018-10-07: 2 mg via INTRAVENOUS
  Administered 2018-10-07 (×3): 1 mg via INTRAVENOUS

## 2018-10-07 MED ORDER — SODIUM CHLORIDE 0.9 % IV SOLN: 250.0000 mL | INTRAVENOUS | Status: DC | PRN

## 2018-10-07 MED ORDER — ONDANSETRON HCL 4 MG/2ML IJ SOLN: 4.0000 mg | Freq: Four times a day (QID) | INTRAMUSCULAR | Status: DC | PRN

## 2018-10-07 MED ORDER — SODIUM CHLORIDE 0.9% FLUSH: 3.0000 mL | Freq: Two times a day (BID) | INTRAVENOUS | Status: DC

## 2018-10-07 MED ORDER — FENTANYL CITRATE (PF) 100 MCG/2ML IJ SOLN
INTRAMUSCULAR | Status: AC
  Filled 2018-10-07: qty 2

## 2018-10-07 MED ORDER — HEPARIN SODIUM (PORCINE) 1000 UNIT/ML IJ SOLN
INTRAMUSCULAR | Status: DC | PRN
  Administered 2018-10-07: 1000 [IU] via INTRAVENOUS

## 2018-10-07 MED ORDER — BUPIVACAINE HCL (PF) 0.25 % IJ SOLN
INTRAMUSCULAR | Status: DC | PRN
  Administered 2018-10-07: 30 mL

## 2018-10-07 MED ORDER — FENTANYL CITRATE (PF) 100 MCG/2ML IJ SOLN
INTRAMUSCULAR | Status: DC | PRN
  Administered 2018-10-07: 12.5 ug via INTRAVENOUS
  Administered 2018-10-07 (×2): 25 ug via INTRAVENOUS

## 2018-10-07 MED ORDER — SODIUM CHLORIDE 0.9 % IV SOLN
INTRAVENOUS | Status: DC
  Administered 2018-10-07: 09:00:00 via INTRAVENOUS

## 2018-10-07 MED ORDER — ACETAMINOPHEN 325 MG PO TABS
650.0000 mg | ORAL_TABLET | ORAL | Status: DC | PRN
  Filled 2018-10-07: qty 2

## 2018-10-07 SURGICAL SUPPLY — 11 items
BAG SNAP BAND KOVER 36X36 (MISCELLANEOUS) ×1 IMPLANT
CATH JOSEPH QUAD ALLRED 6F REP (CATHETERS) ×1 IMPLANT
CATH SMTCH THERMOCOOL SF DF (CATHETERS) ×1 IMPLANT
CATH WEBSTER BI DIR CS D-F CRV (CATHETERS) ×1 IMPLANT
PACK EP LATEX FREE (CUSTOM PROCEDURE TRAY) ×2
PACK EP LF (CUSTOM PROCEDURE TRAY) ×1 IMPLANT
PAD PRO RADIOLUCENT 2001M-C (PAD) ×2 IMPLANT
PATCH CARTO3 (PAD) ×1 IMPLANT
SHEATH PINNACLE 6F 10CM (SHEATH) ×1 IMPLANT
SHEATH PINNACLE 8F 10CM (SHEATH) ×2 IMPLANT
TUBING SMART ABLATE COOLFLOW (TUBING) ×1 IMPLANT

## 2018-10-07 NOTE — Discharge Instructions (Signed)

## 2018-10-07 NOTE — H&P (Signed)
HPI Ryan Glass is referred today by Dr. Mayford Knife for evaluation of PVC's. He is a pleasant middle aged (62 years old) man from [place] who wore a cardiac moniter and was noted to have 21K PVC's in 24 hours. He has minimal symptoms of palpitations but feels tired and has a reduction in energy. He is on multiple meds. He has been on atenolol despite having the PVC's.      Allergies  Allergen Reactions  . Amlodipine Besylate Swelling    Leg swelling with 10mg  daily am dosing; decreased with BID 5mg  dosing  . Aspirin Itching           Current Outpatient Medications  Medication Sig Dispense Refill  . atorvastatin (LIPITOR) 20 MG tablet Take 1 tablet (20 mg total) by mouth daily. 90 tablet 3  . colchicine 0.6 MG tablet Take 1-2 tablets by mouth daily.    . cyclobenzaprine (FLEXERIL) 5 MG tablet Take 5 mg by mouth.    . diclofenac (VOLTAREN) 75 MG EC tablet Take 1 tablet (75 mg total) by mouth 2 (two) times daily. 60 tablet 0  . gabapentin (NEURONTIN) 300 MG capsule Take 1 capsule by mouth 3 (three) times daily.    . magnesium oxide (MAG-OX) 400 MG tablet Take 1 tablet by mouth daily.    . montelukast (SINGULAIR) 10 MG tablet Take 1 tablet (10 mg total) by mouth at bedtime. 90 tablet 3  . sildenafil (REVATIO) 20 MG tablet Take by mouth.    amLODipine (NORVASC) 10 MG tablet Take 1 tablet (10 mg total) by mouth daily. 30 tablet 0  . atenolol (TENORMIN) 25 MG tablet Take 1 tablet (25 mg total) by mouth 2 (two) times daily. 60 tablet 0  . fluticasone (FLONASE) 50 MCG/ACT nasal spray Place 1 spray into both nostrils 2 (two) times daily. 16 g 6  . furosemide (LASIX) 40 MG tablet Take 1 tablet (40 mg total) by mouth daily. 30 tablet 0  . losartan-hydrochlorothiazide (HYZAAR) 100-25 MG tablet Take 1 tablet by mouth daily.    . Potassium Chloride ER 20 MEQ TBCR Take 2 tablets by mouth 2 (two) times daily. 120 tablet 0  . sodium chloride (OCEAN) 0.65 % SOLN nasal spray Place 2 sprays into both  nostrils every 2 (two) hours while awake.  0   No current facility-administered medications for this visit.          Past Medical History:  Diagnosis Date  . Dilated aortic root (HCC)    60mm aortic root and 35mm ascending aorta by echo 04/2018  . Umbilical hernia     ROS:   All systems reviewed and negative except as noted in the HPI.   History reviewed. No pertinent surgical history.   History reviewed. No pertinent family history.   Social History        Socioeconomic History  . Marital status: Single    Spouse name: Not on file  . Number of children: Not on file  . Years of education: Not on file  . Highest education level: Not on file  Occupational History  . Not on file  Social Needs  . Financial resource strain: Not on file  . Food insecurity:    Worry: Not on file    Inability: Not on file  . Transportation needs:    Medical: Not on file    Non-medical: Not on file  Tobacco Use  . Smoking status: Never Smoker  . Smokeless tobacco: Never Used  Substance and  Sexual Activity  . Alcohol use: Yes  . Drug use: Never  . Sexual activity: Not on file  Lifestyle  . Physical activity:    Days per week: Not on file    Minutes per session: Not on file  . Stress: Not on file  Relationships  . Social connections:    Talks on phone: Not on file    Gets together: Not on file    Attends religious service: Not on file    Active member of club or organization: Not on file    Attends meetings of clubs or organizations: Not on file    Relationship status: Not on file  . Intimate partner violence:    Fear of current or ex partner: Not on file    Emotionally abused: Not on file    Physically abused: Not on file    Forced sexual activity: Not on file  Other Topics Concern  . Not on file  Social History Narrative  . Not on file     BP 114/74   Ht 5\' 11"  (1.803 m)   Wt 250 lb 6.4 oz (113.6 kg)   SpO2 97%    BMI 34.92 kg/m   Physical Exam:  Well appearing 62 yo man, NAD HEENT: Unremarkable Neck:  6 cm, JVD, no thyromegally Lymphatics:  No adenopathy Back:  No CVA tenderness Lungs:  Clear with no wheezes HEART:  Regular rate rhythm, no murmurs, no rubs, no clicks Abd:  soft, positive bowel sounds, no organomegally, no rebound, no guarding Ext:  2 plus pulses, no edema, no cyanosis, no clubbing Skin:  No rashes no nodules Neuro:  CN II through XII intact, motor grossly intact  EKG - nsr with PVC's  Assess/Plan: 1. PVC's - I have offered him a trial of flecainide vs catheter ablation. The PVC's appear to be originating near the lateral wall of the inferior apex of the RV.  2. HTN - his blood pressure is controlled.  3. Fatigue - he has preserved LV function. I suspect his symptoms are related to the PVC's but hard to know for sure.  68.  EP Attending  Patient seen and examined. Agree with the findings as noted above. The patient presents today for PVC ablation. Since his prior clinic visit, no change.   Gerhard Munch.D.

## 2018-10-07 NOTE — Progress Notes (Signed)
Dr Ladona Ridgel aware of pt's BP.  Does not want to treat at this time.

## 2018-10-07 NOTE — Progress Notes (Addendum)
Site area: RFV x 3 Site Prior to Removal:  Level 0 Pressure Applied For:25 min Manual:   yes Patient Status During Pull:  stable Post Pull Site:  Level 0 Post Pull Instructions Given:  yes Post Pull Pulses Present: palpable Dressing Applied:  clear Bedrest begins @ 1345 till 1945 Comments:

## 2018-10-07 NOTE — Interval H&P Note (Signed)
History and Physical Interval Note:  10/07/2018 10:54 AM  Ryan Glass  has presented today for surgery, with the diagnosis of PVC  The various methods of treatment have been discussed with the patient and family. After consideration of risks, benefits and other options for treatment, the patient has consented to  Procedure(s): PVC ABLATION (N/A) as a surgical intervention .  The patient's history has been reviewed, patient examined, no change in status, stable for surgery.  I have reviewed the patient's chart and labs.  Questions were answered to the patient's satisfaction.     [name]

## 2018-10-08 ENCOUNTER — Encounter (HOSPITAL_COMMUNITY): Payer: Self-pay | Admitting: Internal Medicine

## 2018-10-12 ENCOUNTER — Telehealth: Payer: Self-pay | Admitting: Physician Assistant

## 2018-10-12 NOTE — Telephone Encounter (Signed)
The patient's wife called the answering service after-hours today. Had PVC ablation a few days ago. Overnight developed new swelling, bruising, nodule and some pain at procedure site. Per protocol advised they proceed to ER for eval to r/o post-procedure complication requiring further intervention since office is closed. They will do so and verbalized understanding and gratitude. Ettel Albergo PA-C

## 2018-10-13 ENCOUNTER — Encounter (HOSPITAL_COMMUNITY): Payer: Self-pay | Admitting: Internal Medicine

## 2018-11-05 ENCOUNTER — Ambulatory Visit (INDEPENDENT_AMBULATORY_CARE_PROVIDER_SITE_OTHER): Payer: PRIVATE HEALTH INSURANCE | Admitting: Internal Medicine

## 2018-11-05 ENCOUNTER — Ambulatory Visit: Payer: Self-pay | Admitting: Internal Medicine

## 2018-11-05 ENCOUNTER — Encounter: Payer: Self-pay | Admitting: Internal Medicine

## 2018-11-05 VITALS — BP 128/72 | HR 54 | Ht 69.0 in | Wt 247.0 lb

## 2018-11-05 DIAGNOSIS — I493 Ventricular premature depolarization: Secondary | ICD-10-CM

## 2018-11-05 NOTE — Progress Notes (Signed)
HPI [name] returns today for followup after undergoing PVC ablation several weeks ago. He is a pleasant 62 yo man with over 20K PVC's despite medical therapy with beta blockers. He has done well since his ablation and wants to get off of his beta blocker. No chest pain or sob.  Allergies  Allergen Reactions  . Amlodipine Besylate Swelling    Leg swelling with 10mg  daily am dosing; decreased with BID 5mg  dosing  . Aspirin Itching     Current Outpatient Medications  Medication Sig Dispense Refill  . amLODipine (NORVASC) 10 MG tablet Take 1 tablet (10 mg total) by mouth daily. (Patient taking differently: Take 10 mg by mouth at bedtime. ) 90 tablet 0  . atenolol (TENORMIN) 25 MG tablet Take 1 tablet (25 mg total) by mouth 2 (two) times daily. 60 tablet 0  . atorvastatin (LIPITOR) 20 MG tablet Take 1 tablet (20 mg total) by mouth daily. 90 tablet 3  . colchicine 0.6 MG tablet Take 1-2 tablets by mouth daily as needed (gout).     diclofenac (VOLTAREN) 75 MG EC tablet Take 1 tablet (75 mg total) by mouth 2 (two) times daily. 60 tablet 0  . fluticasone (FLONASE) 50 MCG/ACT nasal spray Place 1 spray into both nostrils 2 (two) times daily. (Patient taking differently: Place 1 spray into both nostrils daily as needed for allergies. ) 16 g 6  . furosemide (LASIX) 40 MG tablet Take 1 tablet (40 mg total) by mouth daily. 30 tablet 0  . losartan-hydrochlorothiazide (HYZAAR) 100-25 MG tablet Take 1 tablet by mouth daily. 30 tablet 0  . magnesium oxide (MAG-OX) 400 MG tablet Take 400 mg by mouth daily.     . montelukast (SINGULAIR) 10 MG tablet Take 1 tablet (10 mg total) by mouth at bedtime. 90 tablet 3  . Potassium Chloride ER 20 MEQ TBCR Take 2 tablets by mouth 2 (two) times daily. 120 tablet 0   No current facility-administered medications for this visit.      Past Medical History:  Diagnosis Date  . Dilated aortic root (HCC)    69mm aortic root and 24mm ascending aorta by echo  04/2018  . Umbilical hernia     ROS:   All systems reviewed and negative except as noted in the HPI.   Past Surgical History:  Procedure Laterality Date  . PVC ABLATION N/A 10/07/2018   Procedure: PVC ABLATION;  Surgeon: 05/2018, MD;  Location: [facility];  Service: Cardiovascular;  Laterality: N/A;     No family history on file.   Social History   Socioeconomic History  . Marital status: Single    Spouse name: Not on file  . Number of children: Not on file  . Years of education: Not on file  . Highest education level: Not on file  Occupational History  . Not on file  Social Needs  . Financial resource strain: Not on file  . Food insecurity:    Worry: Not on file    Inability: Not on file  . Transportation needs:    Medical: Not on file    Non-medical: Not on file  Tobacco Use  . Smoking status: Never Smoker  . Smokeless tobacco: Never Used  Substance and Sexual Activity  . Alcohol use: Yes  . Drug use: Never  . Sexual activity: Not on file  Lifestyle  . Physical activity:    Days per week: Not on file  Minutes per session: Not on file  . Stress: Not on file  Relationships  . Social connections:    Talks on phone: Not on file    Gets together: Not on file    Attends religious service: Not on file    Active member of club or organization: Not on file    Attends meetings of clubs or organizations: Not on file    Relationship status: Not on file  . Intimate partner violence:    Fear of current or ex partner: Not on file    Emotionally abused: Not on file    Physically abused: Not on file    Forced sexual activity: Not on file  Other Topics Concern  . Not on file  Social History Narrative  . Not on file     BP 128/72   Pulse (!) 54   Ht 5\' 9"  (1.753 m)   Wt 247 lb (112 kg)   BMI 36.48 kg/m   Physical Exam:  Well appearing NAD HEENT: Unremarkable Neck:  No JVD, no thyromegally Lymphatics:  No adenopathy Back:  No CVA  tenderness Lungs:  Clear with no wheezes HEART:  Regular rate rhythm, no murmurs, no rubs, no clicks Abd:  soft, positive bowel sounds, no organomegally, no rebound, no guarding Ext:  2 plus pulses, no edema, no cyanosis, no clubbing Skin:  No rashes no nodules Neuro:  CN II through XII intact, motor grossly intact  EKG - nsr with no PVC's   Assess/Plan: 1. PVC's - He has no PVC's on exam today as I listened to 50 heart beats. He will wean off of his atenolol and undergo watchful waiting. 2. HTN - his blood pressure is controlled. Once he is off of the atenolol, we will watch his bp and consider adding some other bp lowering med if needed.  .D.

## 2018-11-05 NOTE — Patient Instructions (Addendum)
Medication Instructions:  Your physician recommends that you continue on your current medications as directed. Please refer to the Current Medication list given to you today.  Wean off your atenolol.  Take one tablet by mouth for one week and then stop taking atenolol.  Labwork: None ordered.  Testing/Procedures: None ordered.  Follow-Up: Your physician wants you to follow-up in: as needed with Dr. Ladona Ridgel.      Any Other Special Instructions Will Be Listed Below (If Applicable).  If you need a refill on your cardiac medications before your next appointment, please call your pharmacy.

## 2018-11-27 ENCOUNTER — Ambulatory Visit: Payer: Self-pay | Admitting: *Deleted

## 2018-11-27 VITALS — BP 131/91 | HR 94

## 2018-11-27 DIAGNOSIS — I10 Essential (primary) hypertension: Secondary | ICD-10-CM

## 2018-11-27 DIAGNOSIS — I493 Ventricular premature depolarization: Secondary | ICD-10-CM

## 2018-11-27 DIAGNOSIS — Z0130 Encounter for examination of blood pressure without abnormal findings: Secondary | ICD-10-CM

## 2018-11-27 NOTE — Progress Notes (Signed)
Medication refill request. Medication reconciliation performed since last cardiology OV MD had pt wean off Atenolol.  At this time this is the only med change per pt. He feels well off this. Does not feel PVCs like he did prior to ablation, nor since coming off Atenolol. Feels like he has some of his energy back.  No f/u appt scheduled with cardiology. Per note, f/u prn with electrophysiology.  Pt had Mag Ox refill from former cardiologist in [facility]. Pt confirmed he plans to stay with [facility], not return to [facility] at this time.

## 2018-12-05 ENCOUNTER — Other Ambulatory Visit: Payer: Self-pay

## 2018-12-05 ENCOUNTER — Other Ambulatory Visit: Payer: Self-pay | Admitting: Registered Nurse

## 2018-12-05 NOTE — Telephone Encounter (Addendum)
Medication prescribed by Dr. Lady Gary, pt's former cardiologist at [facility]. Last Rx'd in Aug 2019. Bridge refill authorized in October to get pt through to [facility] ablation f/u appt with new cardiology office. Pt saw Dr. [name] 11/05/18 and per OV notes, if refills on cardiac meds are needed, pt is to contact his pharmacy. So medication refill denied and RN left message at pharmacy (no one could be reached to speak with) advising them to request this from Dr Lubertha Basque office, being pt's current cardiologist.

## 2018-12-05 NOTE — Telephone Encounter (Signed)
Previously filled by [facility]; pt is requesting Dr. Ladona Ridgel fill this medication.

## 2018-12-07 ENCOUNTER — Other Ambulatory Visit: Payer: Self-pay | Admitting: Registered Nurse

## 2018-12-07 NOTE — Telephone Encounter (Addendum)
Noted if patient has run out will bridge refill losartan hydrochlorothiazide 100/25mg  po daily #30 RF0 until pharmacy is able to communicate with cardiology.

## 2018-12-07 NOTE — Telephone Encounter (Signed)
Bridge refill 30 day supply approved losaratan hydrochlorothiazide 100/25mg  po daily

## 2018-12-08 ENCOUNTER — Telehealth: Payer: Self-pay | Admitting: Internal Medicine

## 2018-12-08 NOTE — Telephone Encounter (Signed)
[facility] pharmacy is requesting a refill on losartan-HCTZ 100-25 mg tablet. Dr. Ladona Ridgel did not prescribe this medication. Would Dr. Ladona Ridgel like to refill this medication? Please address

## 2018-12-08 NOTE — Telephone Encounter (Signed)
Contacted pharmacy to explain to them that Albina Billet, NP prescribed this medication and they needed to contact her office. Pharmacy tech verbalized understanding.

## 2018-12-08 NOTE — Telephone Encounter (Signed)
I think this has been filled by Albina Billet NP

## 2018-12-08 NOTE — Telephone Encounter (Signed)
Pt to follow with [facility] PRN.  Pt should continue to follow with [facility].

## 2018-12-16 ENCOUNTER — Telehealth: Payer: Self-pay | Admitting: *Deleted

## 2018-12-16 ENCOUNTER — Encounter: Payer: Self-pay | Admitting: Registered Nurse

## 2018-12-16 ENCOUNTER — Ambulatory Visit: Payer: Self-pay | Admitting: Registered Nurse

## 2018-12-16 VITALS — BP 152/100 | HR 86 | Temp 97.9°F

## 2018-12-16 DIAGNOSIS — J069 Acute upper respiratory infection, unspecified: Secondary | ICD-10-CM

## 2018-12-16 DIAGNOSIS — B9789 Other viral agents as the cause of diseases classified elsewhere: Principal | ICD-10-CM

## 2018-12-16 NOTE — Telephone Encounter (Signed)
Victorino Dike & Dr. Ladona Ridgel,   Per your t-comm notes 12/08/18, you had directed [facility] to contact our clinic/Tina Betancourt, NP for [name]'s Losartan/HCTZ refill.   I wanted to clarify our position with this patients' meds and request your help with this as well. 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 We do dispense limited medications to employees from our dispensary at no cost to the employee vs them paying a copay at their pharmacy. However, we do not manage chronic Rxs. We need a Rx from the pt's own provider to fill from.  The last fill for [name]'s Losartan/HCTZ from [place] was a bridge refill for 90 days authorizes in October to get him through to his PVC ablation f/u appt with Dr. Ladona Ridgel 11/05/18.    His former cardiologist Dr. Lady Gary managed all of his cardiac/BP meds. However these have not been adjusted in years, until he saw you, despite worsening sx. So since he has now switched his care to you, had his ablation, and was able to wean off Atenolol, we are looking for new Rx's for the remainder of cardiac meds just for general management. Would these be written by you, or will you now have him revert back to seeing Dr. Mayford Knife for general cardiology? If you manage them, do you have a certain time frame you would like him to f/u in vs. prn f/u per your last note?  Meds managed by former cardiologist filled in [facility] health clinic ([facility] in [facility] (will default to print, you may shred as I can view in [facility])):  Amlodipine 10mg  po daily Atorvastatin 20mg  po daily (Rx'd by Dr. Mayford Knife, good until Aug 2020) Furosemide 40mg  po daily Potassium Chloride ER 20mg  po 2 tabs daily  Meds managed by former cardiologist filled externally [facility]):   Losartan-HCTZ 100-25mg  po daily  Magnesium oxide 400mg  po daily  I apologize for the lengthy note. It can be difficult to explain our role and limitations sometimes. Thank you for partnering with [place] in [name]'s care!

## 2018-12-16 NOTE — Patient Instructions (Addendum)
How to Perform a Sinus Rinse A sinus rinse is a home treatment that is used to rinse your sinuses with a sterile mixture of salt and water (saline solution). Sinuses are air-filled spaces in your skull behind the bones of your face and forehead that open into your nasal cavity. A sinus rinse can help to clear mucus, dirt, dust, or pollen from your nasal cavity. You may do a sinus rinse when you have a cold, a virus, nasal allergy symptoms, a sinus infection, or stuffiness in your nose or sinuses. Talk with your health care provider about whether a sinus rinse might help you. What are the risks? A sinus rinse is generally safe and effective. However, there are a few risks, which include:  A burning sensation in your sinuses. This may happen if you do not make the saline solution as directed. Be sure to follow all directions when making the saline solution.  Nasal irritation.  Infection from contaminated water. This is rare, but possible. Do not do a sinus rinse if you have had ear or nasal surgery, ear infection, or blocked ears. Supplies needed:  Saline solution or powder.  Distilled or sterile water may be needed to mix with saline powder. ? You may use boiled and cooled tap water. Boil tap water for 5 minutes; cool until it is lukewarm. Use within 24 hours. ? Do not use regular tap water to mix with the saline solution.  Neti pot or nasal rinse bottle. These supplies release the saline solution into your nose and through your sinuses. Neti pots and nasal rinse bottles can be purchased at [employer], a health food store, or online. How to perform a sinus rinse  1. Wash your hands with soap and water. 2. Wash your device according to the directions that came with the product and then dry it. 3. Use the solution that comes with your product or one that is sold separately in stores. Follow the mixing directions on the package if you need to mix with sterile or distilled  water. 4. Fill the device with the amount of saline solution noted in the device instructions. 5. Stand over a sink and tilt your head sideways over the sink. 6. Place the spout of the device in your upper nostril (the one closer to the ceiling). 7. Gently pour or squeeze the saline solution into your nasal cavity. The liquid should drain out from the lower nostril if you are not too congested. 8. While rinsing, breathe through your open mouth. 9. Gently blow your nose to clear any mucus and rinse solution. Blowing too hard may cause ear pain. 10. Repeat in your other nostril. 11. Clean and rinse your device with clean water and then air-dry it. Talk with your health care provider or pharmacist if you have questions about how to do a sinus rinse. Summary  A sinus rinse is a home treatment that is used to rinse your sinuses with a sterile mixture of salt and water (saline solution).  A sinus rinse is generally safe and effective. Follow all instructions carefully.  Before doing a sinus rinse, talk with your health care provider about whether it would be helpful for you. This information is not intended to replace advice given to you by your health care provider. Make sure you discuss any questions you have with your health care provider. Document Released: 06/23/2014 Document Revised: 09/23/2017 Document Reviewed: 09/23/2017 Elsevier Interactive Patient Education  2019 [employer]. Viral Respiratory Infection A respiratory  infection is an illness that affects part of the respiratory system, such as the lungs, nose, or throat. A respiratory infection that is caused by a virus is called a viral respiratory infection. Common types of viral respiratory infections include:  A cold.  The flu (influenza).  A respiratory syncytial virus (RSV) infection. What are the causes? This condition is caused by a virus. What are the signs or symptoms? Symptoms of this condition include:  A stuffy or  runny nose.  Yellow or green nasal discharge.  A cough.  Sneezing.  Fatigue.  Achy muscles.  A sore throat.  Sweating or chills.  A fever.  A headache. How is this diagnosed? This condition may be diagnosed based on:  Your symptoms.  A physical exam.  Testing of nasal swabs. How is this treated? This condition may be treated with medicines, such as:  Antiviral medicine. This may shorten the length of time a person has symptoms.  Expectorants. These make it easier to cough up mucus.  Decongestant nasal sprays.  Acetaminophen or NSAIDs to relieve fever and pain. Antibiotic medicines are not prescribed for viral infections. This is because antibiotics are designed to kill bacteria. They are not effective against viruses. Follow these instructions at home:  Managing pain and congestion  Take over-the-counter and prescription medicines only as told by your health care provider.  If you have a sore throat, gargle with a salt-water mixture 3-4 times a day or as needed. To make a salt-water mixture, completely dissolve -1 tsp of salt in 1 cup of warm water.  Use nose drops made from salt water to ease congestion and soften raw skin around your nose.  Drink enough fluid to keep your urine pale yellow. This helps prevent dehydration and helps loosen up mucus. General instructions  Rest as much as possible.  Do not drink alcohol.  Do not use any products that contain nicotine or tobacco, such as cigarettes and e-cigarettes. If you need help quitting, ask your health care provider.  Keep all follow-up visits as told by your health care provider. This is important. How is this prevented?   Get an annual flu shot. You may get the flu shot in late summer, fall, or winter. Ask your health care provider when you should get your flu shot.  Avoid exposing others to your respiratory infection. ? Stay home from work or school as told by your health care provider. ? Wash  your hands with soap and water often, especially after you cough or sneeze. If soap and water are not available, use alcohol-based hand sanitizer.  Avoid contact with people who are sick during cold and flu season. This is generally fall and winter. Contact a health care provider if:  Your symptoms last for 10 days or longer.  Your symptoms get worse over time.  You have a fever.  You have severe sinus pain in your face or forehead.  The glands in your jaw or neck become very swollen. Get help right away if you:  Feel pain or pressure in your chest.  Have shortness of breath.  Faint or feel like you will faint.  Have severe and persistent vomiting.  Feel confused or disoriented. Summary  A respiratory infection is an illness that affects part of the respiratory system, such as the lungs, nose, or throat. A respiratory infection that is caused by a virus is called a viral respiratory infection.  Common types of viral respiratory infections are a cold, influenza, and respiratory  syncytial virus (RSV) infection.  Symptoms of this condition include a stuffy or runny nose, cough, sneezing, fatigue, achy muscles, sore throat, and fevers or chills.  Antibiotic medicines are not prescribed for viral infections. This is because antibiotics are designed to kill bacteria. They are not effective against viruses. This information is not intended to replace advice given to you by your health care provider. Make sure you discuss any questions you have with your health care provider. Document Released: 09/05/2005 Document Revised: 01/06/2018 Document Reviewed: 01/06/2018 Elsevier Interactive Patient Education  2019 [employer]  Hypertension Hypertension, commonly called high blood pressure, is when the force of blood pumping through the arteries is too strong. The arteries are the blood vessels that carry blood from the heart throughout the body. Hypertension forces the heart to work harder  to pump blood and may cause arteries to become narrow or stiff. Having untreated or uncontrolled hypertension can cause heart attacks, strokes, kidney disease, and other problems. A blood pressure reading consists of a higher number over a lower number. Ideally, your blood pressure should be below 120/80. The first ("top") number is called the systolic pressure. It is a measure of the pressure in your arteries as your heart beats. The second ("bottom") number is called the diastolic pressure. It is a measure of the pressure in your arteries as the heart relaxes. What are the causes? The cause of this condition is not known. What increases the risk? Some risk factors for high blood pressure are under your control. Others are not. Factors you can change  Smoking.  Having type 2 diabetes mellitus, high cholesterol, or both.  Not getting enough exercise or physical activity.  Being overweight.  Having too much fat, sugar, calories, or salt (sodium) in your diet.  Drinking too much alcohol. Factors that are difficult or impossible to change  Having chronic kidney disease.  Having a family history of high blood pressure.  Age. Risk increases with age.  Race. You may be at higher risk if you are African-American.  Gender. Men are at higher risk than women before age 63. After age 63, women are at higher risk than men.  Having obstructive sleep apnea.  Stress. What are the signs or symptoms? Extremely high blood pressure (hypertensive crisis) may cause:  Headache.  Anxiety.  Shortness of breath.  Nosebleed.  Nausea and vomiting.  Severe chest pain.  Jerky movements you cannot control (seizures). How is this diagnosed? This condition is diagnosed by measuring your blood pressure while you are seated, with your arm resting on a surface. The cuff of the blood pressure monitor will be placed directly against the skin of your upper arm at the level of your heart. It should be  measured at least twice using the same arm. Certain conditions can cause a difference in blood pressure between your right and left arms. Certain factors can cause blood pressure readings to be lower or higher than normal (elevated) for a short period of time:  When your blood pressure is higher when you are in a health care provider's office than when you are at home, this is called white coat hypertension. Most people with this condition do not need medicines.  When your blood pressure is higher at home than when you are in a health care provider's office, this is called masked hypertension. Most people with this condition may need medicines to control blood pressure. If you have a high blood pressure reading during one visit or you have  normal blood pressure with other risk factors:  You may be asked to return on a different day to have your blood pressure checked again.  You may be asked to monitor your blood pressure at home for 1 week or longer. If you are diagnosed with hypertension, you may have other blood or imaging tests to help your health care provider understand your overall risk for other conditions. How is this treated? This condition is treated by making healthy lifestyle changes, such as eating healthy foods, exercising more, and reducing your alcohol intake. Your health care provider may prescribe medicine if lifestyle changes are not enough to get your blood pressure under control, and if:  Your systolic blood pressure is above 130.  Your diastolic blood pressure is above 80. Your personal target blood pressure may vary depending on your medical conditions, your age, and other factors. Follow these instructions at home: Eating and drinking   Eat a diet that is high in fiber and potassium, and low in sodium, added sugar, and fat. An example eating plan is called the DASH (Dietary Approaches to Stop Hypertension) diet. To eat this way: ? Eat plenty of fresh fruits and  vegetables. Try to fill half of your plate at each meal with fruits and vegetables. ? Eat whole grains, such as whole wheat pasta, brown rice, or whole grain bread. Fill about one quarter of your plate with whole grains. ? Eat or drink low-fat dairy products, such as skim milk or low-fat yogurt. ? Avoid fatty cuts of meat, processed or cured meats, and poultry with skin. Fill about one quarter of your plate with lean proteins, such as fish, chicken without skin, beans, eggs, and tofu. ? Avoid premade and processed foods. These tend to be higher in sodium, added sugar, and fat.  Reduce your daily sodium intake. Most people with hypertension should eat less than 1,500 mg of sodium a day.  Limit alcohol intake to no more than 1 drink a day for nonpregnant women and 2 drinks a day for men. One drink equals 12 oz of beer, 5 oz of wine, or 1 oz of hard liquor. Lifestyle   Work with your health care provider to maintain a healthy body weight or to lose weight. Ask what an ideal weight is for you.  Get at least 30 minutes of exercise that causes your heart to beat faster (aerobic exercise) most days of the week. Activities may include walking, swimming, or biking.  Include exercise to strengthen your muscles (resistance exercise), such as pilates or lifting weights, as part of your weekly exercise routine. Try to do these types of exercises for 30 minutes at least 3 days a week.  Do not use any products that contain nicotine or tobacco, such as cigarettes and e-cigarettes. If you need help quitting, ask your health care provider.  Monitor your blood pressure at home as told by your health care provider.  Keep all follow-up visits as told by your health care provider. This is important. Medicines  Take over-the-counter and prescription medicines only as told by your health care provider. Follow directions carefully. Blood pressure medicines must be taken as prescribed.  Do not skip doses of blood  pressure medicine. Doing this puts you at risk for problems and can make the medicine less effective.  Ask your health care provider about side effects or reactions to medicines that you should watch for. Contact a health care provider if:  You think you are having a reaction to  a medicine you are taking.  You have headaches that keep coming back (recurring).  You feel dizzy.  You have swelling in your ankles.  You have trouble with your vision. Get help right away if:  You develop a severe headache or confusion.  You have unusual weakness or numbness.  You feel faint.  You have severe pain in your chest or abdomen.  You vomit repeatedly.  You have trouble breathing. Summary  Hypertension is when the force of blood pumping through your arteries is too strong. If this condition is not controlled, it may put you at risk for serious complications.  Your personal target blood pressure may vary depending on your medical conditions, your age, and other factors. For most people, a normal blood pressure is less than 120/80.  Hypertension is treated with lifestyle changes, medicines, or a combination of both. Lifestyle changes include weight loss, eating a healthy, low-sodium diet, exercising more, and limiting alcohol. This information is not intended to replace advice given to you by your health care provider. Make sure you discuss any questions you have with your health care provider. Document Released: 11/26/2005 Document Revised: 10/24/2016 Document Reviewed: 10/24/2016 Elsevier Interactive Patient Education  2019 [employer].

## 2018-12-16 NOTE — Telephone Encounter (Signed)
New Rxs needed for pt to continue filling meds [facility]. Rxs are dispensed here but managed by pt's provider. Rx's have not been updated by pt's formed cardiologist for years. Now that he has established care with Dr. Mayford Knife, had his ablation, and Dr. Ladona Ridgel has weaned him off Atenolol, updated Rxs are necessary. Pt has not been seen in office by Dr. Mayford Knife since April 2019. 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 Does he need a new OV with Dr. Mayford Knife prior to Rxs being completed? If so, I can inform him of that. 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

## 2018-12-16 NOTE — Telephone Encounter (Signed)
Thank you. I've routed a note to [facility] requesting new Rxs or advisement of f/u with MD Turner.

## 2018-12-16 NOTE — Progress Notes (Signed)
Subjective:    Patient ID: Ryan KindredJose S Wittmeyer, male    DOB: 11/09/1956, 63 y.o.   MRN: [id number]  62y/o [place] established male pt c/o rhinorrhea, HA, sore throat, nonproductive cough, intermittent wheezing,  chills x2 days. Using nasal saline spray, Singulair at home. Flonase using intermittenly prn needs refill.  Discussed losartan hydrochlorothiazide was bridge refilled 30 days by me last week needs to follow up with heartcare for refill long term.  RN Rolly SalterHaley contacting their office as NP refused to fill Rx for patient per [employer] documentation and I bridge refilled to ensure he did not run out of medication.  + sick contacts at work     Review of Systems  Constitutional: Positive for chills and fatigue. Negative for activity change, appetite change, diaphoresis, fever and unexpected weight change.  HENT: Positive for congestion, postnasal drip, rhinorrhea and sore throat. Negative for dental problem, drooling, ear discharge, ear pain, facial swelling, hearing loss, mouth sores, nosebleeds, sinus pressure, sinus pain, sneezing, tinnitus, trouble swallowing and voice change.   Eyes: Negative for photophobia, pain, discharge, redness, itching and visual disturbance.  Respiratory: Positive for cough and wheezing. Negative for choking, chest tightness, shortness of breath and stridor.   Cardiovascular: Negative for chest pain, palpitations and leg swelling.  Gastrointestinal: Negative for abdominal distention, abdominal pain, diarrhea, nausea and vomiting.  Endocrine: Negative for cold intolerance and heat intolerance.  Genitourinary: Negative for dysuria.  Musculoskeletal: Negative for arthralgias, back pain, gait problem, joint swelling, myalgias, neck pain and neck stiffness.  Skin: Negative for color change, pallor, rash and wound.  Allergic/Immunologic: Positive for environmental allergies. Negative for food allergies and immunocompromised state.  Neurological: Positive for headaches.  Negative for dizziness, tremors, seizures, syncope, facial asymmetry, speech difficulty, weakness, light-headedness and numbness.  Hematological: Negative for adenopathy. Does not bruise/bleed easily.  Psychiatric/Behavioral: Positive for sleep disturbance. Negative for agitation, behavioral problems and confusion.       Objective:   Physical Exam Vitals signs and nursing note reviewed.  Constitutional:      General: He is not in acute distress.    Appearance: Normal appearance. He is well-developed. He is obese. He is ill-appearing. He is not toxic-appearing or diaphoretic.  HENT:     Head: Normocephalic and atraumatic.     Jaw: There is normal jaw occlusion. No trismus, tenderness, swelling, pain on movement or malocclusion.     Salivary Glands: Right salivary gland is not diffusely enlarged or tender. Left salivary gland is not diffusely enlarged or tender.     Right Ear: Hearing, ear canal and external ear normal. A middle ear effusion is present.     Left Ear: Hearing, ear canal and external ear normal. A middle ear effusion is present.     Nose: Mucosal edema, congestion and rhinorrhea present. No nasal deformity, septal deviation, signs of injury, laceration or nasal tenderness.     Right Nostril: No epistaxis or occlusion.     Left Nostril: No epistaxis or occlusion.     Right Turbinates: Enlarged and swollen. Not pale.     Left Turbinates: Enlarged and swollen. Not pale.     Right Sinus: No maxillary sinus tenderness or frontal sinus tenderness.     Left Sinus: No maxillary sinus tenderness or frontal sinus tenderness.     Comments: Bilateral allergic shiners; lower eyelids 1+/4 nonpitting edema; clear discharge bilateral nasal turbinates edema/erythema; cobblestoning posterior pharynx; bilateral TMs air fluid level clear    Mouth/Throat:     Lips:  Pink. No lesions.     Mouth: Mucous membranes are moist. Mucous membranes are not pale, not dry and not cyanotic. No lacerations,  oral lesions or angioedema.     Dentition: Normal dentition. Does not have dentures. No dental caries or dental abscesses.     Pharynx: Uvula midline. Posterior oropharyngeal erythema present. No oropharyngeal exudate or uvula swelling.     Tonsils: No tonsillar exudate or tonsillar abscesses. Swelling: 0 on the right. 0 on the left.  Eyes:     General: Lids are normal. No visual field deficit or scleral icterus.       Right eye: No foreign body, discharge or hordeolum.        Left eye: No foreign body, discharge or hordeolum.     Extraocular Movements:     Right eye: Normal extraocular motion and no nystagmus.     Left eye: Normal extraocular motion and no nystagmus.     Conjunctiva/sclera: Conjunctivae normal.     Right eye: Right conjunctiva is not injected. No chemosis, exudate or hemorrhage.    Left eye: Left conjunctiva is not injected. No chemosis, exudate or hemorrhage.    Pupils: Pupils are equal, round, and reactive to light. Pupils are equal.     Right eye: Pupil is round and reactive.     Left eye: Pupil is round and reactive.  Neck:     Musculoskeletal: Normal range of motion and neck supple. Normal range of motion. No edema, erythema, neck rigidity, spinous process tenderness or muscular tenderness.     Thyroid: No thyroid mass or thyromegaly.     Trachea: Trachea and phonation normal. No tracheal tenderness or tracheal deviation.  Cardiovascular:     Rate and Rhythm: Normal rate and regular rhythm.     Chest Wall: PMI is not displaced.     Pulses: Normal pulses.          Radial pulses are 2+ on the right side and 2+ on the left side.     Heart sounds: Normal heart sounds, S1 normal and S2 normal. No murmur. No friction rub. No gallop.   Pulmonary:     Effort: Pulmonary effort is normal. No respiratory distress.     Breath sounds: Normal breath sounds and air entry. No stridor, decreased air movement or transmitted upper airway sounds. No decreased breath sounds, wheezing,  rhonchi or rales.     Comments: Mild intermittent rare cough in exam room; spoke fulls entences without difficulty Abdominal:     General: There is no distension.  Musculoskeletal: Normal range of motion.        General: No tenderness.     Right shoulder: Normal.     Left shoulder: Normal.     Right elbow: Normal.    Left elbow: Normal.     Right hip: Normal.     Left hip: Normal.     Right knee: Normal.     Left knee: Normal.     Cervical back: Normal.     Thoracic back: Normal.     Lumbar back: Normal.     Right hand: Normal.     Left hand: Normal.  Lymphadenopathy:     Head:     Right side of head: No submental, submandibular, tonsillar, preauricular, posterior auricular or occipital adenopathy.     Left side of head: No submental, submandibular, tonsillar, preauricular, posterior auricular or occipital adenopathy.     Cervical: No cervical adenopathy.     Right cervical: No  superficial, deep or posterior cervical adenopathy.    Left cervical: No superficial, deep or posterior cervical adenopathy.  Skin:    General: Skin is warm and dry.     Capillary Refill: Capillary refill takes less than 2 seconds.     Coloration: Skin is not ashen, cyanotic, jaundiced, mottled, pale or sallow.     Findings: No abrasion, bruising, burn, ecchymosis, erythema, laceration, lesion, petechiae, rash or wound.     Nails: There is no clubbing.   Neurological:     Mental Status: He is alert and oriented to person, place, and time.     GCS: GCS eye subscore is 4. GCS verbal subscore is 5. GCS motor subscore is 6.     Cranial Nerves: Cranial nerves are intact. No cranial nerve deficit, dysarthria or facial asymmetry.     Sensory: Sensation is intact. No sensory deficit.     Motor: Motor function is intact. No weakness, tremor, atrophy, abnormal muscle tone or seizure activity.     Coordination: Coordination is intact. Coordination normal.     Gait: Gait is intact. Gait normal.     Comments: Gait  sure and steady in hallway; on/off exam table and in/out of chair without difficulty  Psychiatric:        Attention and Perception: Attention and perception normal.        Mood and Affect: Mood and affect normal.        Speech: Speech normal.        Behavior: Behavior normal. Behavior is cooperative.        Thought Content: Thought content normal.        Cognition and Memory: Cognition and memory normal.        Judgment: Judgment normal.           Assessment & Plan:  A-elevated blood pressure, viral URI without cough  P-consider prednisone if no improvement with plan of care as sp02 decreased today.  Follow up 48 hours for repeat re-evaluation if wheezing worsening, sinus pain/pressure, fever, teeth pain, worsening cough.  Patient may use normal saline nasal spray 2 sprays each nostril q2h wa as needed. flonase 1 spray each nostril BID #1 RF6 sent Oct 2019 electronic to his pharmacy of choice refills remain patient will contact them to pick up today as running low at home.  Given 1 bottle nasal saline from clinic stock.  Phenylephrine 10mg  po q6h prn rhinitis give 4 UD from clinic stock  May use tylenol 1000mg  po QID prn pain.  Discussed post nasal drip causing sore throat.  .  Patient denied personal or family history of ENT cancer.  OTC antihistamine of choice claritin/zyrtec 10mg  po daily.  Avoid triggers if possible.  Shower prior to bedtime   Call or return to clinic as needed if these symptoms worsen or fail to improve as anticipated.   Exitcare handout on nonallergic rhinitis and sinus rinse.  Patient verbalized understanding of instructions, agreed with plan of care and had no further questions at this time.  P2:  Avoidance and hand washing.  Took his medications this am.  Will decrease caffeine intake  Discussed ER if chest pain, worst headache of life, dyspnea or visual changes for re-evaluation.  Will see RN Rolly Salter this week for repeat BP check.  Exitcare handout hypertension.   Patient verbalized understanding information/instructions, agreed with plan of care and had no further questions at this time.

## 2018-12-17 NOTE — Telephone Encounter (Signed)
Ok to reorder but needs to come in for [employer]

## 2018-12-17 NOTE — Telephone Encounter (Signed)
Left patient a voicemail to call back

## 2018-12-17 NOTE — Telephone Encounter (Signed)
Pt requesting refills on these medications Amlodipine, furosemide, potassium chloride ER, losartan-HCTZ and magnesium oxide. Dr. Mayford Knife did not prescribed these medications. Would Dr. Mayford Knife like to refill these medications. Please address

## 2018-12-17 NOTE — Telephone Encounter (Signed)
Patient called today stating that Albina Billet NP refilled the medication as a one time courtesy, but said that his cardiologist would need to continue refilling it.   Patient said that he needs the medication refilled. He would like a call back.

## 2018-12-18 ENCOUNTER — Ambulatory Visit: Payer: Self-pay | Admitting: Registered Nurse

## 2018-12-18 ENCOUNTER — Encounter: Payer: Self-pay | Admitting: Registered Nurse

## 2018-12-18 VITALS — BP 153/105 | HR 75 | Temp 97.7°F

## 2018-12-18 DIAGNOSIS — J069 Acute upper respiratory infection, unspecified: Secondary | ICD-10-CM

## 2018-12-18 DIAGNOSIS — M25542 Pain in joints of left hand: Secondary | ICD-10-CM

## 2018-12-18 DIAGNOSIS — Z79899 Other long term (current) drug therapy: Secondary | ICD-10-CM

## 2018-12-18 MED ORDER — PHENYLEPHRINE HCL 5 MG PO TABS: 5.0000 mg | ORAL_TABLET | Freq: Four times a day (QID) | ORAL | 0 refills | Status: AC | PRN

## 2018-12-18 MED ORDER — SALINE SPRAY 0.65 % NA SOLN: 2.0000 | NASAL | 0 refills | Status: DC

## 2018-12-18 NOTE — Progress Notes (Signed)
Subjective:    Patient ID: Ryan Glass, male    DOB: 15-Apr-1956, 63 y.o.   MRN: [id number]  62y/o [place] established male pt presenting for f/u of viral URI, seen 12/16/2018. Yesterday sx had worsened to where he was having chills, generalized body aches, nonproductive cough he left work early and went home to rest. Sx improved today. Sts he has more energy and chills and body aches still present but less severe. Denies sinus pressure/pain, fever, otalgia. BMET drawn per cardiology request prior to med refills.   Patient also wanted to discuss enlarged knuckles on left hand--5th has been bothering him more recently wondering if we can cut off swollen/enlarged area so he doesn't bump and irritate it.  PMHx gout and arthritis  Last workup with PCM 2018 start to rule out rheumatoid arthritis per chart review Epic care everywhere initial labs completed      Review of Systems  Constitutional: Positive for chills and fatigue. Negative for activity change, appetite change, diaphoresis, fever and unexpected weight change.  HENT: Positive for congestion and postnasal drip. Negative for dental problem, drooling, ear discharge, ear pain, facial swelling, hearing loss, mouth sores, nosebleeds, rhinorrhea, sinus pressure, sinus pain, sneezing, sore throat, tinnitus, trouble swallowing and voice change.   Eyes: Negative for photophobia, pain, discharge, redness, itching and visual disturbance.  Respiratory: Positive for cough. Negative for choking, chest tightness, shortness of breath, wheezing and stridor.   Cardiovascular: Negative for chest pain, palpitations and leg swelling.  Gastrointestinal: Negative for abdominal distention, abdominal pain, blood in stool, constipation, diarrhea, nausea and vomiting.  Endocrine: Negative for cold intolerance and heat intolerance.  Genitourinary: Negative for dysuria.  Musculoskeletal: Positive for arthralgias, joint swelling and myalgias. Negative for back pain,  gait problem, neck pain and neck stiffness.  Skin: Negative for color change, pallor, rash and wound.  Allergic/Immunologic: Positive for environmental allergies. Negative for food allergies and immunocompromised state.  Neurological: Negative for dizziness, tremors, seizures, syncope, facial asymmetry, speech difficulty, weakness, light-headedness, numbness and headaches.  Hematological: Negative for adenopathy. Does not bruise/bleed easily.  Psychiatric/Behavioral: Negative for agitation, behavioral problems, confusion and sleep disturbance.       Objective:   Physical Exam Vitals signs and nursing note reviewed.  Constitutional:      General: He is not in acute distress.    Appearance: Normal appearance. He is well-developed and well-groomed. He is ill-appearing. He is not toxic-appearing or diaphoretic.  HENT:     Head: Normocephalic and atraumatic.     Jaw: There is normal jaw occlusion. No trismus.     Right Ear: Hearing, ear canal and external ear normal. A middle ear effusion is present. There is no impacted cerumen.     Left Ear: Hearing, ear canal and external ear normal. A middle ear effusion is present. There is no impacted cerumen.     Nose: Mucosal edema, congestion and rhinorrhea present. No nasal deformity, septal deviation, laceration or nasal tenderness.     Right Turbinates: Enlarged and swollen. Not pale.     Left Turbinates: Enlarged and swollen. Not pale.     Right Sinus: No maxillary sinus tenderness or frontal sinus tenderness.     Left Sinus: No maxillary sinus tenderness or frontal sinus tenderness.     Mouth/Throat:     Lips: Pink. No lesions.     Mouth: Mucous membranes are moist. Mucous membranes are not pale, not dry and not cyanotic. No injury, lacerations, oral lesions or angioedema.  Dentition: Normal dentition. Does not have dentures. No dental caries or dental abscesses.     Tongue: No lesions.     Pharynx: Uvula midline. Pharyngeal swelling and  posterior oropharyngeal erythema present. No oropharyngeal exudate or uvula swelling.     Tonsils: No tonsillar exudate or tonsillar abscesses. Swelling: 0 on the right. 0 on the left.     Comments: Bilateral allergic shiners; cobblestoning posteiror pharynx; bilateral TMs air fluid level clear; clear discharge bilateral nasal turbinates edema erythema Eyes:     General: Lids are normal. Allergic shiner present. No scleral icterus.       Right eye: No foreign body, discharge or hordeolum.        Left eye: No foreign body, discharge or hordeolum.     Extraocular Movements: Extraocular movements intact.     Right eye: Normal extraocular motion and no nystagmus.     Left eye: Normal extraocular motion and no nystagmus.     Conjunctiva/sclera: Conjunctivae normal.     Right eye: Right conjunctiva is not injected. No chemosis, exudate or hemorrhage.    Left eye: Left conjunctiva is not injected. No chemosis, exudate or hemorrhage.    Pupils: Pupils are equal, round, and reactive to light. Pupils are equal.     Right eye: Pupil is round and reactive.     Left eye: Pupil is round and reactive.  Neck:     Musculoskeletal: Normal range of motion and neck supple. Normal range of motion. No edema, erythema, neck rigidity, spinous process tenderness or muscular tenderness.     Thyroid: No thyroid mass or thyromegaly.     Trachea: Trachea normal. No tracheal tenderness or tracheal deviation.  Cardiovascular:     Rate and Rhythm: Normal rate and regular rhythm.     Chest Wall: PMI is not displaced.     Pulses:          Radial pulses are 2+ on the right side and 2+ on the left side.     Heart sounds: Normal heart sounds, S1 normal and S2 normal. No murmur. No friction rub. No gallop.   Pulmonary:     Effort: Pulmonary effort is normal. No respiratory distress.     Breath sounds: Normal breath sounds and air entry. No stridor, decreased air movement or transmitted upper airway sounds. No decreased breath  sounds, wheezing, rhonchi or rales.     Comments: No cough observed in clinic; spoke full sentences without difficulty Abdominal:     General: There is no distension.     Palpations: Abdomen is soft.  Musculoskeletal: Normal range of motion.        General: No tenderness.     Right shoulder: Normal.     Left shoulder: Normal.     Right elbow: Normal.    Left elbow: Normal.     Right wrist: Normal.     Left wrist: Normal.     Right hip: Normal.     Left hip: Normal.     Right knee: Normal.     Left knee: Normal.     Right ankle: Normal.     Left ankle: Normal.     Cervical back: Normal.     Thoracic back: Normal.     Lumbar back: Normal.     Left hand: He exhibits swelling. He exhibits normal range of motion, no tenderness, no bony tenderness, no deformity and no laceration. Normal sensation noted. Normal strength noted.       Hands:  Comments: Firm nodule anterior MCPs left 2,3 and 5; 1cm or less diameter nonmobile nontender firm well demarcated no increased temperature; skin dry calloused hands  Lymphadenopathy:     Head:     Right side of head: No submental, submandibular, tonsillar, preauricular, posterior auricular or occipital adenopathy.     Left side of head: No submental, submandibular, tonsillar, preauricular, posterior auricular or occipital adenopathy.     Cervical: No cervical adenopathy.     Right cervical: No superficial, deep or posterior cervical adenopathy.    Left cervical: No superficial, deep or posterior cervical adenopathy.  Skin:    General: Skin is warm and dry.     Capillary Refill: Capillary refill takes less than 2 seconds.     Coloration: Skin is not ashen, cyanotic, jaundiced, mottled, pale or sallow.     Findings: No abrasion, abscess, acne, bruising, burn, ecchymosis, erythema, laceration, lesion, petechiae, rash or wound.     Nails: There is no clubbing.   Neurological:     General: No focal deficit present.     Mental Status: He is alert  and oriented to person, place, and time. Mental status is at baseline.     GCS: GCS eye subscore is 4. GCS verbal subscore is 5. GCS motor subscore is 6.     Cranial Nerves: Cranial nerves are intact. No cranial nerve deficit, dysarthria or facial asymmetry.     Sensory: Sensation is intact. No sensory deficit.     Motor: Motor function is intact. No weakness, tremor, atrophy, abnormal muscle tone or seizure activity.     Coordination: Coordination is intact. Coordination normal.     Gait: Gait normal.     Comments: Bilateral hand grasp equal. 5/5; in/out of chair without difficulty; gait sure and steady in hallway  Psychiatric:        Attention and Perception: Attention and perception normal.        Mood and Affect: Mood and affect normal.        Speech: Speech normal.        Behavior: Behavior normal. Behavior is cooperative.        Thought Content: Thought content normal.        Judgment: Judgment normal.           Assessment & Plan:  A-elevated blood pressure, viral URI without cough, left hand joints pain  P-consider prednisone if no improvement with plan of care as sp02 decreased today.avoiding today as bp still elevated discussed with patient  Follow up 48 hours for repeat re-evaluation if wheezing worsening, sinus pain/pressure, fever, teeth pain, worsening cough.  Patient may use normal saline nasal spray 2 sprays each nostril q2h wa as needed. flonase 1 spray each nostril BID #1 RF6 sent Oct 2019 electronic to his pharmacy of choice refills Phenylephrine 5-10mg  po q6h prn rhinitis give 4 UD from clinic stock  May use tylenol 1000mg  po QID prn pain.  Discussed post nasal drip causing sore throat.   Patient denied personal or family history of ENT cancer.  OTC antihistamine of choice claritin/zyrtec 10mg  po daily.  Avoid triggers if possible.  Shower prior to bedtime   Call or return to clinic as needed if these symptoms worsen or fail to improve as anticipated.   Exitcare  handout on viral URI and sinus rinse.  Patient verbalized understanding of instructions, agreed with plan of care and had no further questions at this time.  P2:  Avoidance and hand washing.  BMET  drawn today for cardiology refills.  Took his medications this am.  Will decrease caffeine intake  Discussed ER if chest pain, worst headache of life, dyspnea or visual changes for re-evaluation.  Will see RN Rolly SalterHaley next week for repeat BP check.  Exitcare handout hypertension.  Patient verbalized understanding information/instructions, agreed with plan of care and had no further questions at this time.  Follow up with PCM as started work up for rheumatoid arthritis left hand 2018 initial lab work negative, DDx gout, osteoarthritis, pseudogout  Patient had asked if we could cut off the enlarged portion of MCP joints 2, 3 and 5.  Discussed with him we could not in this clinic but he could discuss with orthopedics to see if surgical candidate.  Discussed with him I thought related to arthritis unsure if osteo/rheumatoid our gout/pseudogout related joint damage.  Exitcare handout gout, arthritis, rheumatoid arthritis.  Continue diclofenac and colchicine prn and watch diet/eat low purine, stay hydrated.  Consider imaging.  Patient verbalized understanding information/instructions, agreed with plan of care and had no further questions at this time.

## 2018-12-18 NOTE — Telephone Encounter (Signed)
Saw Ryan Glass in clinic today for URI f/u. He asked about Rxs. I see Dr. Norris Cross request for a BMET prior to med refills. This was drawn in our clinic today. I will route the results to you tomorrow for review.  His BP has been trending upward recently. He is currently sick so this is likely contributing currently. However, he still likely needs a BP med re-evaluation since they have not been adjusted in many years aside from Dr. Ladona Ridgel removing his Atenolol. What time frame would you like to see him in the office?  Thank you!

## 2018-12-18 NOTE — Patient Instructions (Addendum)
How to Perform a Sinus Rinse A sinus rinse is a home treatment that is used to rinse your sinuses with a sterile mixture of salt and water (saline solution). Sinuses are air-filled spaces in your skull behind the bones of your face and forehead that open into your nasal cavity. A sinus rinse can help to clear mucus, dirt, dust, or pollen from your nasal cavity. You may do a sinus rinse when you have a cold, a virus, nasal allergy symptoms, a sinus infection, or stuffiness in your nose or sinuses. Talk with your health care provider about whether a sinus rinse might help you. What are the risks? A sinus rinse is generally safe and effective. However, there are a few risks, which include:  A burning sensation in your sinuses. This may happen if you do not make the saline solution as directed. Be sure to follow all directions when making the saline solution.  Nasal irritation.  Infection from contaminated water. This is rare, but possible. Do not do a sinus rinse if you have had ear or nasal surgery, ear infection, or blocked ears. Supplies needed:  Saline solution or powder.  Distilled or sterile water may be needed to mix with saline powder. ? You may use boiled and cooled tap water. Boil tap water for 5 minutes; cool until it is lukewarm. Use within 24 hours. ? Do not use regular tap water to mix with the saline solution.  Neti pot or nasal rinse bottle. These supplies release the saline solution into your nose and through your sinuses. Neti pots and nasal rinse bottles can be purchased at [employer], a health food store, or online. How to perform a sinus rinse  1. Wash your hands with soap and water. 2. Wash your device according to the directions that came with the product and then dry it. 3. Use the solution that comes with your product or one that is sold separately in stores. Follow the mixing directions on the package if you need to mix with sterile or distilled  water. 4. Fill the device with the amount of saline solution noted in the device instructions. 5. Stand over a sink and tilt your head sideways over the sink. 6. Place the spout of the device in your upper nostril (the one closer to the ceiling). 7. Gently pour or squeeze the saline solution into your nasal cavity. The liquid should drain out from the lower nostril if you are not too congested. 8. While rinsing, breathe through your open mouth. 9. Gently blow your nose to clear any mucus and rinse solution. Blowing too hard may cause ear pain. 10. Repeat in your other nostril. 11. Clean and rinse your device with clean water and then air-dry it. Talk with your health care provider or pharmacist if you have questions about how to do a sinus rinse. Summary  A sinus rinse is a home treatment that is used to rinse your sinuses with a sterile mixture of salt and water (saline solution).  A sinus rinse is generally safe and effective. Follow all instructions carefully.  Before doing a sinus rinse, talk with your health care provider about whether it would be helpful for you. This information is not intended to replace advice given to you by your health care provider. Make sure you discuss any questions you have with your health care provider. Document Released: 06/23/2014 Document Revised: 09/23/2017 Document Reviewed: 09/23/2017 Elsevier Interactive Patient Education  2019 [employer]. Viral Respiratory Infection A respiratory  infection is an illness that affects part of the respiratory system, such as the lungs, nose, or throat. A respiratory infection that is caused by a virus is called a viral respiratory infection. Common types of viral respiratory infections include:  A cold.  The flu (influenza).  A respiratory syncytial virus (RSV) infection. What are the causes? This condition is caused by a virus. What are the signs or symptoms? Symptoms of this condition include:  A stuffy or  runny nose.  Yellow or green nasal discharge.  A cough.  Sneezing.  Fatigue.  Achy muscles.  A sore throat.  Sweating or chills.  A fever.  A headache. How is this diagnosed? This condition may be diagnosed based on:  Your symptoms.  A physical exam.  Testing of nasal swabs. How is this treated? This condition may be treated with medicines, such as:  Antiviral medicine. This may shorten the length of time a person has symptoms.  Expectorants. These make it easier to cough up mucus.  Decongestant nasal sprays.  Acetaminophen or NSAIDs to relieve fever and pain. Antibiotic medicines are not prescribed for viral infections. This is because antibiotics are designed to kill bacteria. They are not effective against viruses. Follow these instructions at home:  Managing pain and congestion  Take over-the-counter and prescription medicines only as told by your health care provider.  If you have a sore throat, gargle with a salt-water mixture 3-4 times a day or as needed. To make a salt-water mixture, completely dissolve -1 tsp of salt in 1 cup of warm water.  Use nose drops made from salt water to ease congestion and soften raw skin around your nose.  Drink enough fluid to keep your urine pale yellow. This helps prevent dehydration and helps loosen up mucus. General instructions  Rest as much as possible.  Do not drink alcohol.  Do not use any products that contain nicotine or tobacco, such as cigarettes and e-cigarettes. If you need help quitting, ask your health care provider.  Keep all follow-up visits as told by your health care provider. This is important. How is this prevented?   Get an annual flu shot. You may get the flu shot in late summer, fall, or winter. Ask your health care provider when you should get your flu shot.  Avoid exposing others to your respiratory infection. ? Stay home from work or school as told by your health care provider. ? Wash  your hands with soap and water often, especially after you cough or sneeze. If soap and water are not available, use alcohol-based hand sanitizer.  Avoid contact with people who are sick during cold and flu season. This is generally fall and winter. Contact a health care provider if:  Your symptoms last for 10 days or longer.  Your symptoms get worse over time.  You have a fever.  You have severe sinus pain in your face or forehead.  The glands in your jaw or neck become very swollen. Get help right away if you:  Feel pain or pressure in your chest.  Have shortness of breath.  Faint or feel like you will faint.  Have severe and persistent vomiting.  Feel confused or disoriented. Summary  A respiratory infection is an illness that affects part of the respiratory system, such as the lungs, nose, or throat. A respiratory infection that is caused by a virus is called a viral respiratory infection.  Common types of viral respiratory infections are a cold, influenza, and respiratory  syncytial virus (RSV) infection.  Symptoms of this condition include a stuffy or runny nose, cough, sneezing, fatigue, achy muscles, sore throat, and fevers or chills.  Antibiotic medicines are not prescribed for viral infections. This is because antibiotics are designed to kill bacteria. They are not effective against viruses. This information is not intended to replace advice given to you by your health care provider. Make sure you discuss any questions you have with your health care provider. Document Released: 09/05/2005 Document Revised: 01/06/2018 Document Reviewed: 01/06/2018 Elsevier Interactive Patient Education  2019 [employer] Rheumatoid Arthritis Rheumatoid arthritis (RA) is a long-term (chronic) disease that causes inflammation in your joints. RA may start slowly. It usually affects the small joints of the hands and feet. Usually, the same joints are affected on both sides of your body.  Inflammation from RA can also affect other parts of your body, including your heart, eyes, or lungs. RA is an autoimmune disease. That means that your body's defense system (immune system) mistakenly attacks healthy body tissues. There is no cure for RA, but medicines can help your symptoms and halt or slow down the progression of the disease. What are the causes? The exact cause of RA is not known. What increases the risk? This condition is more likely to develop in:  Women.  People who have a family history of RA or other autoimmune diseases. What are the signs or symptoms? Symptoms of this condition vary from person to person. Symptoms usually start gradually. They are often worse in the morning. The first symptom may be morning stiffness that lasts longer than 30 minutes. As RA progresses, symptoms may include:  Pain, stiffness, swelling, warmth, and tenderness in joints on both sides of your body.  Loss of energy.  Loss of appetite.  Weight loss.  Low-grade fever.  Dry eyes and dry mouth.  Firm lumps (rheumatoid nodules) that grow beneath your skin in areas such as your forearm bones near your elbows and on your hands.  Changes in the appearance of joints (deformity) and loss of joint function. Symptoms of RA often come and go. Sometimes, symptoms get worse for a period of time. These are called flares. How is this diagnosed? This condition is diagnosed based on your symptoms, medical history, and physical exam. You may have X-rays or MRI to check for the type of joint changes that are caused by RA. You may also have blood tests to look for:  Proteins (antibodies) that your immune system may make if you have RA. They include rheumatoid factor (RF) and anti-CCP. ? When blood tests show these proteins, you are said to have "seropositive RA." ? When blood tests do not show these proteins, you may have "seronegative RA."  Inflammation in your blood.  A low number of red blood  cells (anemia). How is this treated? The goals of treatment are to relieve pain, reduce inflammation, and slow down or stop joint damage and disability. Treatment may include:  Lifestyle changes. It is important to rest, eat a healthy diet, and exercise.  Medicines. Your health care provider may adjust your medicines every 3 months until treatment goals are reached. Common medicines include: ? Pain relievers (analgesics). ? Corticosteroids and NSAIDs to reduce inflammation. ? Disease-modifying antirheumatic drugs (DMARDs) to try to slow the course of the disease. ? Biologic response modifiers to reduce inflammation and damage.  Physical therapy and occupational therapy.  Surgery, if you have severe joint damage. Joint replacement or fusing of joints may be needed.  Your health care provider will work with you to identify the best treatment option for you based on assessment of the overall disease activity in your body. Follow these instructions at home:  Take over-the-counter and prescription medicines only as told by your health care provider.  Start an exercise program as told by your health care provider.  Rest when you are having a flare.  Return to your normal activities as told by your health care provider. Ask your health care provider what activities are safe for you.  Keep all follow-up visits as told by your health care provider. This is important. Where to find more information  [employer]: www.rheumatology.org  Arthritis Foundation: www.arthritis.org Contact a health care provider if:  You have a flare-up of RA symptoms.  You have a fever.  You have side effects from your medicines. Get help right away if:  You have chest pain.  You have trouble breathing.  You quickly develop a hot, painful joint that is more severe than your usual joint aches. This information is not intended to replace advice given to you by your health care provider.  Make sure you discuss any questions you have with your health care provider. Document Released: 11/23/2000 Document Revised: 05/09/2017 Document Reviewed: 09/08/2015 Elsevier Interactive Patient Education  2019 [employer] Arthritis Arthritis is a term that is commonly used to refer to joint pain or joint disease. There are more than 100 types of arthritis. What are the causes? The most common cause of this condition is wear and tear of a joint. Other causes include:  Gout.  Inflammation of a joint.  An infection of a joint.  Sprains and other injuries near the joint.  A drug reaction or allergic reaction. In some cases, the cause may not be known. What are the signs or symptoms? The main symptom of this condition is pain in the joint with movement. Other symptoms include:  Redness, swelling, or stiffness at a joint.  Warmth coming from the joint.  Fever.  Overall feeling of illness. How is this diagnosed? This condition may be diagnosed with a physical exam and tests, including:  Blood tests.  Urine tests.  Imaging tests, such as MRI, X-rays, or a CT scan. Sometimes, fluid is removed from a joint for testing. How is this treated? Treatment for this condition may involve:  Treatment of the cause, if it is known.  Rest.  Raising (elevating) the joint.  Applying cold or hot packs to the joint.  Medicines to improve symptoms and reduce inflammation.  Injections of a steroid such as cortisone into the joint to help reduce pain and inflammation. Depending on the cause of your arthritis, you may need to make lifestyle changes to reduce stress on your joint. These changes may include exercising more and losing weight. Follow these instructions at home: Medicines  Take over-the-counter and prescription medicines only as told by your health care provider.  Do not take aspirin to relieve pain if gout is suspected. Activity  Rest your joint if told by your health  care provider. Rest is important when your disease is active and your joint feels painful, swollen, or stiff.  Avoid activities that make the pain worse. It is important to balance activity with rest.  Exercise your joint regularly with range-of-motion exercises as told by your health care provider. Try doing low-impact exercise, such as: ? Swimming. ? Water aerobics. ? Biking. ? Walking. Joint Care   If your joint is swollen, keep  it elevated if told by your health care provider.  If your joint feels stiff in the morning, try taking a warm shower.  If directed, apply heat to the joint. If you have diabetes, do not apply heat without permission from your health care provider. ? Put a towel between the joint and the hot pack or heating pad. ? Leave the heat on the area for 20-30 minutes.  If directed, apply ice to the joint: ? Put ice in a plastic bag. ? Place a towel between your skin and the bag. ? Leave the ice on for 20 minutes, 2-3 times per day.  Keep all follow-up visits as told by your health care provider. This is important. Contact a health care provider if:  The pain gets worse.  You have a fever. Get help right away if:  You develop severe joint pain, swelling, or redness.  Many joints become painful and swollen.  You develop severe back pain.  You develop severe weakness in your leg.  You cannot control your bladder or bowels. This information is not intended to replace advice given to you by your health care provider. Make sure you discuss any questions you have with your health care provider. Document Released: 01/03/2005 Document Revised: 05/03/2016 Document Reviewed: 02/21/2015 Elsevier Interactive Patient Education  2019 [employer] Inc[employer] Gout  Gout is a condition that causes painful swelling of the joints. Gout is a type of inflammation of the joints (arthritis). This condition is caused by having too much uric acid in the body. Uric acid is a chemical  that forms when the body breaks down substances called purines. Purines are important for building body proteins. When the body has too much uric acid, sharp crystals can form and build up inside the joints. This causes pain and swelling. Gout attacks can happen quickly and may be very painful (acute gout). Over time, the attacks can affect more joints and become more frequent (chronic gout). Gout can also cause uric acid to build up under the skin and inside the kidneys. What are the causes? This condition is caused by too much uric acid in your blood. This can happen because:  Your kidneys do not remove enough uric acid from your blood. This is the most common cause.  Your body makes too much uric acid. This can happen with some cancers and cancer treatments. It can also occur if your body is breaking down too many red blood cells (hemolytic anemia).  You eat too many foods that are high in purines. These foods include organ meats and some seafood. Alcohol, especially beer, is also high in purines. A gout attack may be triggered by trauma or stress. What increases the risk? You are more likely to develop this condition if you:  Have a family history of gout.  Are male and middle-aged.  Are male and have gone through menopause.  Are obese.  Frequently drink alcohol, especially beer.  Are dehydrated.  Lose weight too quickly.  Have an organ transplant.  Have lead poisoning.  Take certain medicines, including aspirin, cyclosporine, diuretics, levodopa, and niacin.  Have kidney disease.  Have a skin condition called psoriasis. What are the signs or symptoms? An attack of acute gout happens quickly. It usually occurs in just one joint. The most common place is the big toe. Attacks often start at night. Other joints that may be affected include joints of the feet, ankle, knee, fingers, wrist, or elbow. Symptoms of this condition may include:  Severe  pain.  Warmth.  Swelling.  Stiffness.  Tenderness. The affected joint may be very painful to touch.  Shiny, red, or purple skin.  Chills and fever. Chronic gout may cause symptoms more frequently. More joints may be involved. You may also have white or yellow lumps (tophi) on your hands or feet or in other areas near your joints. How is this diagnosed? This condition is diagnosed based on your symptoms, medical history, and physical exam. You may have tests, such as:  Blood tests to measure uric acid levels.  Removal of joint fluid with a thin needle (aspiration) to look for uric acid crystals.  X-rays to look for joint damage. How is this treated? Treatment for this condition has two phases: treating an acute attack and preventing future attacks. Acute gout treatment may include medicines to reduce pain and swelling, including:  NSAIDs.  Steroids. These are strong anti-inflammatory medicines that can be taken by mouth (orally) or injected into a joint.  Colchicine. This medicine relieves pain and swelling when it is taken soon after an attack. It can be given by mouth or through an IV. Preventive treatment may include:  Daily use of smaller doses of NSAIDs or colchicine.  Use of a medicine that reduces uric acid levels in your blood.  Changes to your diet. You may need to see a dietitian about what to eat and drink to prevent gout. Follow these instructions at home: During a gout attack   If directed, put ice on the affected area: ? Put ice in a plastic bag. ? Place a towel between your skin and the bag. ? Leave the ice on for 20 minutes, 2-3 times a day.  Raise (elevate) the affected joint above the level of your heart as often as possible.  Rest the joint as much as possible. If the affected joint is in your leg, you may be given crutches to use.  Follow instructions from your health care provider about eating or drinking restrictions. Avoiding future gout  attacks  Follow a low-purine diet as told by your dietitian or health care provider. Avoid foods and drinks that are high in purines, including liver, kidney, anchovies, asparagus, herring, mushrooms, mussels, and beer.  Maintain a healthy weight or lose weight if you are overweight. If you want to lose weight, talk with your health care provider. It is important that you do not lose weight too quickly.  Start or maintain an exercise program as told by your health care provider. Eating and drinking  Drink enough fluids to keep your urine pale yellow.  If you drink alcohol: ? Limit how much you use to:  0-1 drink a day for women.  0-2 drinks a day for men. ? Be aware of how much alcohol is in your drink. In the [place] one drink equals one 12 oz bottle of beer (355 mL) one 5 oz glass of wine (148 mL), or one 1 oz glass of hard liquor (44 mL). General instructions  Take over-the-counter and prescription medicines only as told by your health care provider.  Do not drive or use heavy machinery while taking prescription pain medicine.  Return to your normal activities as told by your health care provider. Ask your health care provider what activities are safe for you.  Keep all follow-up visits as told by your health care provider. This is important. Contact a health care provider if you have:  Another gout attack.  Continuing symptoms of a gout attack after 10 days  of treatment.  Side effects from your medicines.  Chills or a fever.  Burning pain when you urinate.  Pain in your lower back or belly. Get help right away if you:  Have severe or uncontrolled pain.  Cannot urinate. Summary  Gout is painful swelling of the joints caused by inflammation.  The most common site of pain is the big toe, but it can affect other joints in the body.  Medicines and dietary changes can help to prevent and treat gout attacks. This information is not intended to replace advice given to  you by your health care provider. Make sure you discuss any questions you have with your health care provider. Document Released: 11/23/2000 Document Revised: 06/18/2018 Document Reviewed: 06/18/2018 Elsevier Interactive Patient Education  2019 [employer]  Managing Your Hypertension Hypertension is commonly called high blood pressure. This is when the force of your blood pressing against the walls of your arteries is too strong. Arteries are blood vessels that carry blood from your heart throughout your body. Hypertension forces the heart to work harder to pump blood, and may cause the arteries to become narrow or stiff. Having untreated or uncontrolled hypertension can cause heart attack, stroke, kidney disease, and other problems. What are blood pressure readings? A blood pressure reading consists of a higher number over a lower number. Ideally, your blood pressure should be below 120/80. The first ("top") number is called the systolic pressure. It is a measure of the pressure in your arteries as your heart beats. The second ("bottom") number is called the diastolic pressure. It is a measure of the pressure in your arteries as the heart relaxes. What does my blood pressure reading mean? Blood pressure is classified into four stages. Based on your blood pressure reading, your health care provider may use the following stages to determine what type of treatment you need, if any. Systolic pressure and diastolic pressure are measured in a unit called mm Hg. Normal  Systolic pressure: below 120.  Diastolic pressure: below 80. Elevated  Systolic pressure: 120-129.  Diastolic pressure: below 80. Hypertension stage 1  Systolic pressure: 130-139.  Diastolic pressure: 80-89. Hypertension stage 2  Systolic pressure: 140 or above.  Diastolic pressure: 90 or above. What health risks are associated with hypertension? Managing your hypertension is an important responsibility. Uncontrolled  hypertension can lead to:  A heart attack.  A stroke.  A weakened blood vessel (aneurysm).  Heart failure.  Kidney damage.  Eye damage.  Metabolic syndrome.  Memory and concentration problems. What changes can I make to manage my hypertension? Hypertension can be managed by making lifestyle changes and possibly by taking medicines. Your health care provider will help you make a plan to bring your blood pressure within a normal range. Eating and drinking   Eat a diet that is high in fiber and potassium, and low in salt (sodium), added sugar, and fat. An example eating plan is called the DASH (Dietary Approaches to Stop Hypertension) diet. To eat this way: ? Eat plenty of fresh fruits and vegetables. Try to fill half of your plate at each meal with fruits and vegetables. ? Eat whole grains, such as whole wheat pasta, brown rice, or whole grain bread. Fill about one quarter of your plate with whole grains. ? Eat low-fat diary products. ? Avoid fatty cuts of meat, processed or cured meats, and poultry with skin. Fill about one quarter of your plate with lean proteins such as fish, chicken without skin, beans, eggs, and  tofu. ? Avoid premade and processed foods. These tend to be higher in sodium, added sugar, and fat.  Reduce your daily sodium intake. Most people with hypertension should eat less than 1,500 mg of sodium a day.  Limit alcohol intake to no more than 1 drink a day for nonpregnant women and 2 drinks a day for men. One drink equals 12 oz of beer, 5 oz of wine, or 1 oz of hard liquor. Lifestyle  Work with your health care provider to maintain a healthy body weight, or to lose weight. Ask what an ideal weight is for you.  Get at least 30 minutes of exercise that causes your heart to beat faster (aerobic exercise) most days of the week. Activities may include walking, swimming, or biking.  Include exercise to strengthen your muscles (resistance exercise), such as weight  lifting, as part of your weekly exercise routine. Try to do these types of exercises for 30 minutes at least 3 days a week.  Do not use any products that contain nicotine or tobacco, such as cigarettes and e-cigarettes. If you need help quitting, ask your health care provider.  Control any long-term (chronic) conditions you have, such as high cholesterol or diabetes. Monitoring  Monitor your blood pressure at home as told by your health care provider. Your personal target blood pressure may vary depending on your medical conditions, your age, and other factors.  Have your blood pressure checked regularly, as often as told by your health care provider. Working with your health care provider  Review all the medicines you take with your health care provider because there may be side effects or interactions.  Talk with your health care provider about your diet, exercise habits, and other lifestyle factors that may be contributing to hypertension.  Visit your health care provider regularly. Your health care provider can help you create and adjust your plan for managing hypertension. Will I need medicine to control my blood pressure? Your health care provider may prescribe medicine if lifestyle changes are not enough to get your blood pressure under control, and if:  Your systolic blood pressure is 130 or higher.  Your diastolic blood pressure is 80 or higher. Take medicines only as told by your health care provider. Follow the directions carefully. Blood pressure medicines must be taken as prescribed. The medicine does not work as well when you skip doses. Skipping doses also puts you at risk for problems. Contact a health care provider if:  You think you are having a reaction to medicines you have taken.  You have repeated (recurrent) headaches.  You feel dizzy.  You have swelling in your ankles.  You have trouble with your vision. Get help right away if:  You develop a severe  headache or confusion.  You have unusual weakness or numbness, or you feel faint.  You have severe pain in your chest or abdomen.  You vomit repeatedly.  You have trouble breathing. Summary  Hypertension is when the force of blood pumping through your arteries is too strong. If this condition is not controlled, it may put you at risk for serious complications.  Your personal target blood pressure may vary depending on your medical conditions, your age, and other factors. For most people, a normal blood pressure is less than 120/80.  Hypertension is managed by lifestyle changes, medicines, or both. Lifestyle changes include weight loss, eating a healthy, low-sodium diet, exercising more, and limiting alcohol. This information is not intended to replace advice given to  you by your health care provider. Make sure you discuss any questions you have with your health care provider. Document Released: 08/20/2012 Document Revised: 10/24/2016 Document Reviewed: 10/24/2016 Elsevier Interactive Patient Education  2019 [employer].

## 2018-12-19 LAB — BASIC METABOLIC PANEL
BUN/Creatinine Ratio: 17 (ref 10–24)
BUN: 20 mg/dL (ref 8–27)
CO2: 20 mmol/L (ref 20–29)
Calcium: 9.1 mg/dL (ref 8.6–10.2)
Chloride: 101 mmol/L (ref 96–106)
Creatinine, Ser: 1.17 mg/dL (ref 0.76–1.27)
GFR calc Af Amer: 77 mL/min/{1.73_m2} (ref >59)
GFR calc non Af Amer: 66 mL/min/{1.73_m2} (ref >59)
Glucose: 81 mg/dL (ref 65–99)
Potassium: 3.5 mmol/L (ref 3.5–5.2)
Sodium: 138 mmol/L (ref 134–144)

## 2018-12-19 NOTE — Telephone Encounter (Signed)
Please have patient followup with his PCP for HTN treatment for now and then first available with me

## 2018-12-19 NOTE — Progress Notes (Signed)
Routed result to Dr Mayford Knife this morning. T-comm received from staff at office: "Pt advised Dr. Norris Cross recommendation to see his PMD and will follow up here on 01/16/2019 at 1:20pm.  Pt reports that he is unsure iof her can get in with his PMD prior to that visit.Marland Kitchen advised him will check into sending in a one time refill after Dr. Mayford Knife has a chance to review his BMET drawn yesterday."  Advised pt via private VM of results, asked to verify that he is taking K+ supplement every day, and asked that he inform RN if he hears back from cardiology office on refill status since they were going to "check into one time refill." Will plan to check in with pt upon return to clinic Monday morning to ensure he has heard back from them and if he has medication available until f/u appt.

## 2018-12-19 NOTE — Telephone Encounter (Signed)
Noted. Thank you. If refills are placed, #30 day supplies of any med should go to his pharmacy, as we only have 90 day supplies available. If 90 days, you may enter Amlodipine, Furosemide, Potassium under [facility] pharmacy in [facility] (Will default to print, but you may shred as I will view them on here). Losartan-HCTZ and Mag Oxide should go to external pharmacy as we do not carry these at all. Please let me know if you have any questions. Thank you again.

## 2018-12-19 NOTE — Telephone Encounter (Signed)
Pt advised Dr. Norris Cross recommendation to see his PMD and will follow up here on 01/16/2019 at 1:20pm.  Pt reports that he is unsure iof her can get in with his PMD prior to that visit.Marland Kitchen advised him will check into sending in a one time refill after Dr. Mayford Knife has a chance to review his BMET drawn yesterday.

## 2018-12-22 NOTE — Progress Notes (Signed)
Noted  

## 2018-12-22 NOTE — Progress Notes (Signed)
noted 

## 2018-12-22 NOTE — Telephone Encounter (Signed)
This patient needs to have BP meds and diuretic for LE edema called in by his PCP since he was not referred to me for these problems and his PCP is treating these problems

## 2018-12-23 NOTE — Telephone Encounter (Signed)
Spoke with the patient, Ryan Billet, NP that refilled his medication can only do it one time and PCP is not going to refill HTN medications.

## 2018-12-25 NOTE — Telephone Encounter (Signed)
Below is correct, that NP Betancourt cannot continue managing HTN meds through [facility]. [facility] needed to dispense meds through on-site dispensary.   All HTN Rx's were initiated and managed by pt's former cardiologist Dr. Lady Gary at [facility]. Pt requested referral to [facility] due to the lack of improvement he had for many years under Dr America Brown care. His PCP [name] MD has never managed his HTN meds.   I will advise pt to contact pcp and advise them that cardiology is requesting that they begin managing HTN meds and have pt inform your office of the outcome of that.

## 2018-12-27 NOTE — Telephone Encounter (Signed)
Yes ok to prescribe for 1 month but no more until he has been seen by an extender in our office

## 2018-12-29 MED ORDER — FUROSEMIDE 40 MG PO TABS: 40.0000 mg | ORAL_TABLET | Freq: Every day | ORAL | 0 refills | Status: DC

## 2018-12-29 MED ORDER — AMLODIPINE BESYLATE 10 MG PO TABS: 10.0000 mg | ORAL_TABLET | Freq: Every day | ORAL | 0 refills | Status: DC

## 2018-12-29 MED ORDER — POTASSIUM CHLORIDE ER 20 MEQ PO TBCR: 2.0000 | EXTENDED_RELEASE_TABLET | Freq: Two times a day (BID) | ORAL | 0 refills | Status: DC

## 2018-12-29 MED ORDER — MAGNESIUM OXIDE 400 MG PO TABS: 1.0000 | ORAL_TABLET | Freq: Every day | ORAL | 0 refills | Status: AC

## 2018-12-29 MED ORDER — LOSARTAN POTASSIUM-HCTZ 100-25 MG PO TABS: 1.0000 | ORAL_TABLET | Freq: Every day | ORAL | 0 refills | Status: DC

## 2018-12-29 NOTE — Telephone Encounter (Signed)
Spoke with Dr. Mayford Knife we could prescribe until his appointment with [place] in February. Spoke with the patient he accepted the appointment on 2/7 and the prescriptions were sent in to preferred pharmacy.

## 2018-12-30 ENCOUNTER — Encounter: Payer: Self-pay | Admitting: Cardiology

## 2019-01-16 ENCOUNTER — Ambulatory Visit: Payer: No Typology Code available for payment source | Admitting: Cardiology

## 2019-01-16 ENCOUNTER — Encounter: Payer: Self-pay | Admitting: Cardiology

## 2019-01-16 ENCOUNTER — Telehealth: Payer: Self-pay | Admitting: *Deleted

## 2019-01-16 ENCOUNTER — Encounter (INDEPENDENT_AMBULATORY_CARE_PROVIDER_SITE_OTHER): Payer: Self-pay

## 2019-01-16 VITALS — BP 132/94 | HR 74 | Ht 69.0 in | Wt 245.8 lb

## 2019-01-16 DIAGNOSIS — I712 Thoracic aortic aneurysm, without rupture, unspecified: Secondary | ICD-10-CM

## 2019-01-16 DIAGNOSIS — R079 Chest pain, unspecified: Secondary | ICD-10-CM

## 2019-01-16 DIAGNOSIS — I10 Essential (primary) hypertension: Secondary | ICD-10-CM | POA: Diagnosis not present

## 2019-01-16 DIAGNOSIS — E785 Hyperlipidemia, unspecified: Secondary | ICD-10-CM

## 2019-01-16 DIAGNOSIS — I493 Ventricular premature depolarization: Secondary | ICD-10-CM

## 2019-01-16 DIAGNOSIS — G4733 Obstructive sleep apnea (adult) (pediatric): Secondary | ICD-10-CM

## 2019-01-16 MED ORDER — NEBIVOLOL HCL 5 MG PO TABS: 5.0000 mg | ORAL_TABLET | Freq: Every day | ORAL | 3 refills | Status: DC

## 2019-01-16 NOTE — Telephone Encounter (Addendum)
Per Dr Mayford Knife order placed to [facility] to order a auto titration from 4 to 18 cm H2O for 2 weeks and then get a download. Order fasxed to [id number] Attn:Donna   Kordsmeier, Levin Erp, RN  Reesa Chew, CMA        Hello,  Dr. Mayford Knife ordered CPAP on auto from 4-18 cm H2O and get a download in 2-3 weeks.  Thanks,  [employer]

## 2019-01-16 NOTE — Progress Notes (Signed)
Cardiology Office Note:    Date:  01/16/2019   ID:  Ryan Glass, DOB 08/31/1956, MRN [id number]  PCP:  Angelica ChessmanAguiar, [name], MD  Cardiologist:  No primary care provider on file.    Referring MD: Angelica ChessmanAguiar, [name], MD   Chief Complaint  Patient presents with  . Follow-up    PVCs, HTN    History of Present Illness:    Ryan KindredJose S Maneri is a 63 y.o. 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 Ladona Ridgelaylor, dilated aortic root, hypertension, lower extremity edema and chest pain.  At his last office visit almost a year ago he was complaining of increased palpitations.  Event monitor showed frequent PVCs and 24-hour Holter showed 22% PVC load.Was referred to EP was felt that the PVCs were originating to the lateral wall of the inferior apex of the RV.  He underwent successful pace PVC ablation in November 2019 and was doing well at follow-up visit in November.  The patient went to get off his beta-blocker and this was weaned off.  He is here today for followup and is doing well.  He denies any chest pain or pressure, SOB, DOE, PND, orthopnea, LE edema, dizziness, palpitations or syncope. He is compliant with his meds and is tolerating meds with no SE.    Past Medical History:  Diagnosis Date  . Hyperlipidemia LDL goal <70   . Hypertension   . PVC's (premature ventricular contractions)    Status post PVC ablation by Dr. Ladona Ridgelaylor 2019  . Thoracic aortic aneurysm (HCC)    46 mm aortic root and 39 mm ascending aorta by echo 2019.  Chest CT 05/2018 showed 4.3 cm thoracic aorta.  . Umbilical hernia     Past Surgical History:  Procedure Laterality Date  . PVC ABLATION N/A 10/07/2018   Procedure: PVC ABLATION;  Surgeon: Marinus Mawaylor, Gregg W, MD;  Location: [facility];  Service: Cardiovascular;  Laterality: N/A;    Current Medications: Current Meds  Medication Sig  . amLODipine (NORVASC) 10 MG tablet Take 1 tablet (10 mg total) by mouth daily for 30 days.  Marland Kitchen. atorvastatin (LIPITOR) 20 MG  tablet Take 1 tablet (20 mg total) by mouth daily.  . colchicine 0.6 MG tablet Take 1-2 tablets by mouth daily as needed (gout).   Marland Kitchen. diclofenac (VOLTAREN) 75 MG EC tablet Take 1 tablet (75 mg total) by mouth 2 (two) times daily.  . fluticasone (FLONASE) 50 MCG/ACT nasal spray Place 1 spray into both nostrils 2 (two) times daily.  . furosemide (LASIX) 40 MG tablet Take 1 tablet (40 mg total) by mouth daily for 30 days.  Marland Kitchen. gabapentin (NEURONTIN) 600 MG tablet Take 600 mg by mouth 3 (three) times daily.   Marland Kitchen. losartan-hydrochlorothiazide (HYZAAR) 100-25 MG tablet Take 1 tablet by mouth daily.  . magnesium oxide (MAG-OX) 400 MG tablet Take 1 tablet (400 mg total) by mouth daily for 30 days.  . montelukast (SINGULAIR) 10 MG tablet Take 1 tablet (10 mg total) by mouth at bedtime.  . Potassium Chloride ER 20 MEQ TBCR Take 2 tablets by mouth 2 (two) times daily for 30 days.  . sildenafil (REVATIO) 20 MG tablet Take 1 tablet (20 mg total) by mouth daily as needed.  . sodium chloride (OCEAN) 0.65 % SOLN nasal spray Place 2 sprays into both nostrils every 2 (two) hours while awake.     Allergies:   Amlodipine besylate and Aspirin   Social History  Socioeconomic History  . Marital status: Single    Spouse name: Not on file  . Number of children: Not on file  . Years of education: Not on file  . Highest education level: Not on file  Occupational History  . Not on file  Social Needs  . Financial resource strain: Not on file  . Food insecurity:    Worry: Not on file    Inability: Not on file  . Transportation needs:    Medical: Not on file    Non-medical: Not on file  Tobacco Use  . Smoking status: Never Smoker  . Smokeless tobacco: Never Used  Substance and Sexual Activity  . Alcohol use: Yes  . Drug use: Never  . Sexual activity: Not on file  Lifestyle  . Physical activity:    Days per week: Not on file    Minutes per session: Not on file  . Stress: Not on file  Relationships  .  Social connections:    Talks on phone: Not on file    Gets together: Not on file    Attends religious service: Not on file    Active member of club or organization: Not on file    Attends meetings of clubs or organizations: Not on file    Relationship status: Not on file  Other Topics Concern  . Not on file  Social History Narrative  . Not on file     Family History: The patient's family history is not on file.  ROS:   Please see the history of present illness.    ROS  All other systems reviewed and negative.   EKGs/Labs/Other Studies Reviewed:    The following studies were reviewed today: PVC ablation  EKG:  EKG is not ordered today.    Recent Labs: 09/18/2018: ALT 28 09/25/2018: Hemoglobin 13.9; Platelets 179 12/18/2018: BUN 20; Creatinine, Ser 1.17; Potassium 3.5; Sodium 138   Recent Lipid Panel    Component Value Date/Time   CHOL 124 09/18/2018 0850   TRIG 126 09/18/2018 0850   HDL 38 (L) 09/18/2018 0850   CHOLHDL 3.3 09/18/2018 0850   LDLCALC 61 09/18/2018 0850    Physical Exam:    VS:  BP (!) 132/94   Pulse 74   Ht 5\' 9"  (1.753 m)   Wt 245 lb 12.8 oz (111.5 kg)   SpO2 92%   BMI 36.30 kg/m     Wt Readings from Last 3 Encounters:  01/16/19 245 lb 12.8 oz (111.5 kg)  11/05/18 247 lb (112 kg)  10/07/18 250 lb (113.4 kg)     GEN:  Well nourished, well developed in no acute distress HEENT: Normal NECK: No JVD; No carotid bruits LYMPHATICS: No lymphadenopathy CARDIAC: RRR, no murmurs, rubs, gallops RESPIRATORY:  Clear to auscultation without rales, wheezing or rhonchi  ABDOMEN: Soft, non-tender, non-distended MUSCULOSKELETAL:  No edema; No deformity  SKIN: Warm and dry NEUROLOGIC:  Alert and oriented x 3 PSYCHIATRIC:  Normal affect   ASSESSMENT:    1. PVC (premature ventricular contraction)   2. Thoracic aortic aneurysm without rupture (HCC)   3. Essential hypertension   4. Hyperlipidemia LDL goal <70   5. Chest pain, unspecified type   6.  OSA (obstructive sleep apnea)    PLAN:    In order of problems listed above:  1.  PVCs -he is status post recent PVC ablation by Dr. Ladona Ridgel and was recently seen by Dr. Ladona Ridgel simply and wanted to get off beta-blockers and he has  been titrated off of them and doing well without any palpitations.  2.  Thoracic aortic aneurysm without rupture- echo 04/11/2018 showed aortic root dimension 46 mm and ascending aortic diameter 39 mm.  Chest CT angio 05/28/2018 showed 4.3 cm thoracic aortic aneurysm.  Continue BP control and statin.  We discussed at length today the need to keep his blood pressure under control to prevent enlargement of his thoracic aortic aneurysm.  Encouraged him to try to get into an exercise program to try to lose some weight as well.  3.  Hypertension - BP is borderline controlled on exam today.  He will continue on losartan HCT 100-25 mg daily.  Having a lot of problems with lower extremity edema on the amlodipine so I am going to stop his amlodipine and place him on Bystolic 5 mg daily.  He is going to check his blood pressures daily for a week and call me with results and see if we need to titrate his blood pressure medicines any further..  BP goal is <130/70.  I suspect that his sleep apnea may be playing a role in his hypertension.  4.  Hyperlipidemia - LDL goal is less than 70 given his dilated aorta.  His last LDL was 61 on 09/18/2018 ALT was normal at 28.  He will continue on atorvastatin 20 mg daily.  5.  Chest pain -this has  resolved.  Nuclear stress test 05/2018 showed no ischemia.  2D echo 04/2018 showed normal LV function.  6.  OSA -he has not been compliant in using his CPAP device.  I explained to him that sleep apnea does have an effect on hypertension and we need to try to get his sleep apnea controlled which should help bring his blood pressure down some.  I am going to order a auto titration from 4 to 18 cm H2O for 2 weeks and then get a download.  I encouraged him to be  compliant in using his device all night every night.  Medication Adjustments/Labs and Tests Ordered: Current medicines are reviewed at length with the patient today.  Concerns regarding medicines are outlined above.  No orders of the defined types were placed in this encounter.  No orders of the defined types were placed in this encounter.   Signed, [name], MD  01/16/2019 1:51 PM    [facility]

## 2019-01-16 NOTE — Patient Instructions (Signed)
Medication Instructions:  Stop: Amlodipine  Start: Bystolic 5 mg, daily, by mouth  If you need a refill on your cardiac medications before your next appointment, please call your pharmacy.   Lab work: No If you have labs (blood work) drawn today and your tests are completely normal, you will receive your results only by: Marland Kitchen MyChart Message (if you have MyChart) OR . A paper copy in the mail If you have any lab test that is abnormal or we need to change your treatment, we will call you to review the results.  Testing/Procedures: None  Follow-Up: At [facility], you and your health needs are our priority.  As part of our continuing mission to provide you with exceptional heart care, we have created designated Provider Care Teams.  These Care Teams include your primary Cardiologist (physician) and Advanced Practice Providers (APPs -  Physician Assistants and Nurse Practitioners) who all work together to provide you with the care you need, when you need it. You will need a follow up appointment in 6 months.  Please call our office 2 months in advance to schedule this appointment.  You may see Dr. Mayford Knife   Check your blood pressure daily, 1-2 hours after taking your medication for 1 week and call the office with the results.

## 2019-01-24 ENCOUNTER — Other Ambulatory Visit: Payer: Self-pay | Admitting: Cardiology

## 2019-01-28 NOTE — Telephone Encounter (Signed)
Was only ordered for one month to make sure he attended our OV.

## 2019-01-28 NOTE — Telephone Encounter (Signed)
Pt pharmacy is requesting refill for furosemide and potassium. Pt was instructed to take these for 30 days, does Dr. Mayford Knife want them to be taken long term? Thank you.

## 2019-01-29 DIAGNOSIS — M25561 Pain in right knee: Secondary | ICD-10-CM | POA: Insufficient documentation

## 2019-02-12 ENCOUNTER — Other Ambulatory Visit: Payer: Self-pay | Admitting: Cardiology

## 2019-02-17 ENCOUNTER — Ambulatory Visit: Payer: Self-pay | Admitting: *Deleted

## 2019-02-17 VITALS — BP 182/110

## 2019-02-17 DIAGNOSIS — Z0130 Encounter for examination of blood pressure without abnormal findings: Secondary | ICD-10-CM

## 2019-02-17 DIAGNOSIS — I10 Essential (primary) hypertension: Secondary | ICD-10-CM

## 2019-02-17 NOTE — Progress Notes (Signed)
Pt was supposed to be checking BPs daily x 1 week after starting Bystolic 3 weeks ago. He has not rechecked BP any. Today BP very elevated. Reports he just finished lunch. Had a moderate amount of sodium in it. Requests to return Thursday for repeat check. Denies any palpitations, chest pain, ShOB, arm or jaw pain, n/v. Pulse was regular today. Sts only side effect he has noted since started Bystolic is a headache about 1-2 hours after taking med in the evening. Takes with dinner. Advised him he could push it out a little later to just before bed to try to avoid the evening headache. He sts he will try this. Denies any other questions/concerns.

## 2019-02-23 ENCOUNTER — Telehealth: Payer: Self-pay | Admitting: Cardiology

## 2019-02-23 DIAGNOSIS — I10 Essential (primary) hypertension: Secondary | ICD-10-CM

## 2019-02-23 NOTE — Telephone Encounter (Signed)
Pt c/o BP issue: STAT if pt c/o blurred vision, one-sided weakness or slurred speech  1. What are your last 5 BP readings? 179/117, 190/110, 189/115,   2. Are you having any other symptoms (ex. Dizziness, headache, blurred vision, passed out)? Just a headache. Thinks it's a side effect of the medicine. When he takes the medicine at night, ~1hr later he gets a headache  3. What is your BP issue? BP is too high   Dr. Mayford Knife mentioned that if his BP were to get too high again, that he increase his medication. He wants to know if this will still be the best solution to lower his BP

## 2019-02-23 NOTE — Telephone Encounter (Signed)
I spoke to the patient who is calling because his BP is elevated (179/117; 190/110; 189/115) with headaches.  He was started on Bystolic 5 mg daily at his last OV, but it is not listed on medication list, and was told to call if BP remains elevated.  He would like Dr Norris Cross nurse to call him with any medication adjustments on 3/17.

## 2019-02-24 NOTE — Telephone Encounter (Signed)
Why did they stop the Bystolic

## 2019-02-24 NOTE — Telephone Encounter (Signed)
Spoke with the patient, he has been taking Bystolic, it was cancelled out by a wellness clinic.

## 2019-02-25 MED ORDER — CHLORTHALIDONE 25 MG PO TABS: 25.0000 mg | ORAL_TABLET | Freq: Every day | ORAL | 3 refills | Status: DC

## 2019-02-25 MED ORDER — HYDRALAZINE HCL 10 MG PO TABS: 10.0000 mg | ORAL_TABLET | Freq: Three times a day (TID) | ORAL | 3 refills | Status: DC

## 2019-02-25 MED ORDER — LOSARTAN POTASSIUM 100 MG PO TABS: 100.0000 mg | ORAL_TABLET | Freq: Every day | ORAL | 3 refills | Status: DC

## 2019-02-25 MED ORDER — NEBIVOLOL HCL 5 MG PO TABS: 5.0000 mg | ORAL_TABLET | Freq: Every day | ORAL | 3 refills | Status: DC

## 2019-02-25 NOTE — Telephone Encounter (Signed)
Please find out what his HR has been running and then I will decide what to do with his Bystolic

## 2019-02-25 NOTE — Telephone Encounter (Signed)
He needs to continue on Bystolic.  He NEEDS to stop the Voltaren.  This is likely driving the elevated BP.  He has an aortic aneursym and is at risk for expansion or rupture if we dont get BP under control.  He needs to talk with his PCP or ortho about other pain options but Volatren needs to stop

## 2019-02-25 NOTE — Telephone Encounter (Signed)
The patient did not know his Bystolic was removed from his chart, he has been taking appropriately.

## 2019-02-25 NOTE — Telephone Encounter (Signed)
Spoke with the patient, he expressed understanding about his medication changes. He requested his labs been done at work, sending the message to see if they can draw. The patient is taking Voltaren for chronic pain, he did not want to stop this medication. The patient asked if he was to continue to take Bystolic in addition to the other new medications. Sending to Dr. Mayford Knife for recommendations.

## 2019-02-25 NOTE — Telephone Encounter (Signed)
Please get a copy of the noted from the provider that stopped it

## 2019-02-25 NOTE — Telephone Encounter (Signed)
Please have him stop Voltaren.  Stop Hyzaar and start Losartan 100mg  daily and start Chlorthalidone 25mg  daily.  Also start Hydralazine 10mg  TID.  Check BP and HR twice daily and call on Monday.  If BP remains consistently 170/74mMhg or higher on any day after making med changes he needs to call

## 2019-02-25 NOTE — Telephone Encounter (Signed)
The patient checked his heart rate and blood pressure. It was 193/107 and HR: 55.

## 2019-02-25 NOTE — Telephone Encounter (Signed)
Needs to come to lab for TSH, aldosterone and PRA level, 24 hour urine for cortisol, catecholamines, metanephrines, dopamine, BMET and set up renal duplex to rule out renal artery stenosis.

## 2019-02-26 ENCOUNTER — Other Ambulatory Visit: Payer: No Typology Code available for payment source

## 2019-02-26 ENCOUNTER — Other Ambulatory Visit: Payer: Self-pay

## 2019-02-26 NOTE — Addendum Note (Signed)
Addended by: Dustin Flock on: 02/26/2019 03:57 PM   Modules accepted: Orders

## 2019-02-26 NOTE — Telephone Encounter (Signed)
The patient came to our office for labs, he is going to [facility] on the first floor.

## 2019-02-26 NOTE — Telephone Encounter (Signed)
Spoke with the patient, he will continue to take his bystolic and will stop his Voltaren. I advised him we are still waiting on his office to contact about labs.

## 2019-02-26 NOTE — Telephone Encounter (Signed)
Encounter not needed

## 2019-02-26 NOTE — Telephone Encounter (Signed)
I spoke with [facility] at work today. I informed him that we do not have the capability to complete all of the requested labs, including the 24hr urine collection and that he will need to contact your office to set up a lab appointment for these orders to be collected.   As far as the original Bystolic order, it was not intentionally cancelled. I apologize for the inconvenience and confusion on that. I had to cancel duplicates that pulled in while doing his med reconciliation recently, but was not aware it had actually d/c'd the order. Thank you for renewing that Rx.   Reminded pt to pick up the new medications at his pharmacy to start asap and check BPs with me and/or at home for the next week. Also reiterated need to stop Voltaren and discuss an alternative with primary. He verbalized understanding of all instructions and stated he would call the office to set up the lab appt.

## 2019-02-26 NOTE — Telephone Encounter (Signed)
Thank you so much

## 2019-03-01 LAB — BASIC METABOLIC PANEL
BUN/Creatinine Ratio: 18 (ref 10–24)
BUN: 20 mg/dL (ref 8–27)
CO2: 26 mmol/L (ref 20–29)
Calcium: 9.5 mg/dL (ref 8.6–10.2)
Chloride: 99 mmol/L (ref 96–106)
Creatinine, Ser: 1.14 mg/dL (ref 0.76–1.27)
GFR calc Af Amer: 79 mL/min/{1.73_m2} (ref >59)
GFR calc non Af Amer: 69 mL/min/{1.73_m2} (ref >59)
Glucose: 84 mg/dL (ref 65–99)
Potassium: 3.7 mmol/L (ref 3.5–5.2)
Sodium: 138 mmol/L (ref 134–144)

## 2019-03-01 LAB — TSH: TSH: 2.26 u[IU]/mL (ref 0.450–4.500)

## 2019-03-01 LAB — ALDOSTERONE + RENIN ACTIVITY W/ RATIO
ALDOS/RENIN RATIO: >66.5 — ABNORMAL HIGH (ref 0.0–30.0)
ALDOSTERONE: 11.1 ng/dL (ref 0.0–30.0)
Renin: <0.167 ng/mL/hr — ABNORMAL LOW (ref 0.167–5.380)

## 2019-03-04 LAB — METANEPHRINES, URINE, 24 HOUR
Metaneph Total, Ur: 27 ug/L
Metanephrines, 24H Ur: 76 ug/24 hr (ref 45–290)
Normetanephrine, 24H Ur: 221 ug/24 hr (ref 82–500)
Normetanephrine, Ur: 79 ug/L

## 2019-03-04 LAB — CATECHOLAMINES, FRACTIONATED, URINE, 24 HOUR
Dopamine , 24H Ur: 263 ug/24 hr (ref 0–510)
Dopamine, Rand Ur: 94 ug/L
Epinephrine, 24H Ur: 3 ug/24 hr (ref 0–20)
Epinephrine, Rand Ur: 1 ug/L
Norepinephrine, 24H Ur: 39 ug/24 hr (ref 0–135)
Norepinephrine, Rand Ur: 14 ug/L

## 2019-03-04 LAB — CORTISOL, URINE, FREE
Cortisol (Ur), Free: 14 ug/24 hr (ref 5–64)
Cortisol,F,ug/L,U: 5 ug/L

## 2019-03-06 MED ORDER — HYDRALAZINE HCL 25 MG PO TABS: 25.0000 mg | ORAL_TABLET | Freq: Three times a day (TID) | ORAL | 3 refills | Status: DC

## 2019-03-06 NOTE — Telephone Encounter (Signed)
Sent to J. bowers

## 2019-03-06 NOTE — Telephone Encounter (Signed)
Spoke with the patient, he expressed understanding about increasing his hydralazine. He wanted to wait to see how the medication change did before going to nephology. He had no further questions.

## 2019-03-12 ENCOUNTER — Other Ambulatory Visit: Payer: Self-pay

## 2019-03-12 ENCOUNTER — Ambulatory Visit: Payer: Self-pay | Admitting: *Deleted

## 2019-03-12 VITALS — BP 138/98 | HR 95

## 2019-03-12 DIAGNOSIS — I10 Essential (primary) hypertension: Secondary | ICD-10-CM

## 2019-03-12 DIAGNOSIS — Z0130 Encounter for examination of blood pressure without abnormal findings: Secondary | ICD-10-CM

## 2019-03-12 NOTE — Progress Notes (Signed)
Pt increased Hydralazine to 25mg  TID per cardiology and has been checking BPs at home with improvement to 140's/80's on average. Has noticed an increased pulse rate as well.   Today diastolic is elevated but improved and systolic is much improved. 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  He sts he will try to upload his home BP log tonight or tomorrow for cardiology to review. He is hesitant to see nephrology if BP is improving on increased med. Advised him to just see what Cards recommends after reviewing improved BPs and he is agreeable to this. He has no further questions/concerns.

## 2019-03-19 ENCOUNTER — Telehealth: Payer: Self-pay | Admitting: Cardiology

## 2019-03-19 NOTE — Telephone Encounter (Signed)
  Patient feels he is high risk for the covid19 and he needs a note from his doctor for him to be out of work. Please call to discuss.

## 2019-03-19 NOTE — Telephone Encounter (Signed)
Advised the patient, our office policy is to not write work notes. He expressed understanding.

## 2019-03-23 ENCOUNTER — Encounter (HOSPITAL_COMMUNITY): Payer: Self-pay

## 2019-03-26 ENCOUNTER — Telehealth: Payer: Self-pay | Admitting: Cardiology

## 2019-03-26 NOTE — Telephone Encounter (Signed)
New message  Patient is have trouble with cpap machine pressure. The patient wants it to be set a little higher per Lupita Leash. Please call to discuss. Lupita Leash states that she will fax to [phone] the down load from the cpap machine today.

## 2019-03-27 NOTE — Telephone Encounter (Signed)
Machine is on 4-18 cm H2O this is too low.  Patient wants the pressure increased not getting enough air. please change between 6 and 8 cm. And stay on auto set.  Patient wants a new cpap machine

## 2019-03-28 NOTE — Telephone Encounter (Signed)
Please get me a download on current auto settings

## 2019-03-30 ENCOUNTER — Telehealth: Payer: Self-pay | Admitting: *Deleted

## 2019-03-30 DIAGNOSIS — G4733 Obstructive sleep apnea (adult) (pediatric): Secondary | ICD-10-CM

## 2019-03-30 NOTE — Telephone Encounter (Signed)
-----   Message from Traci R Turner, MD sent at 03/28/2019  8:08 AM EDT ----- Please change auto CPAP to 8-20cm H2O and get a download in 2 weeks.  Please find out what mask patient is wearing 

## 2019-03-30 NOTE — Telephone Encounter (Addendum)
Order placed to [facility] attn: Lupita Leash  lmtcb on what mask patient is using.

## 2019-04-02 ENCOUNTER — Telehealth: Payer: Self-pay | Admitting: *Deleted

## 2019-04-02 NOTE — Telephone Encounter (Signed)
Pressure change has been sent to [facility] and was done on 4/22.  Will get a d/l in 2 weeks Patient is using a full face Mirage Quattro mask.

## 2019-04-02 NOTE — Telephone Encounter (Signed)
Download sent to be scanned.

## 2019-04-02 NOTE — Telephone Encounter (Signed)
-----   Message from Quintella Reichert, MD sent at 03/28/2019  8:08 AM EDT ----- Please change auto CPAP to 8-20cm H2O and get a download in 2 weeks.  Please find out what mask patient is wearing

## 2019-04-14 ENCOUNTER — Telehealth: Payer: Self-pay | Admitting: Cardiology

## 2019-04-14 NOTE — Telephone Encounter (Signed)
New Message:    Patient calling concering a sleep machine. Please call patient.

## 2019-04-20 ENCOUNTER — Telehealth: Payer: Self-pay | Admitting: Cardiology

## 2019-04-20 NOTE — Telephone Encounter (Signed)
This is a Dr. Mayford Knife pt, will route to [address] triage.

## 2019-04-20 NOTE — Telephone Encounter (Signed)
Pt updated with pt instruction for renal duplex. Pt voiced understanding.

## 2019-04-20 NOTE — Telephone Encounter (Signed)
New Message          Patient is calling today to get an explanation of his appt on 05/14.

## 2019-04-23 ENCOUNTER — Ambulatory Visit (HOSPITAL_COMMUNITY)
Admission: RE | Admit: 2019-04-23 | Discharge: 2019-04-23 | Disposition: A | Discharge status: Home / Self Care | Payer: PRIVATE HEALTH INSURANCE | Source: Ambulatory Visit | Attending: Cardiology | Admitting: Cardiology

## 2019-04-23 DIAGNOSIS — I10 Essential (primary) hypertension: Secondary | ICD-10-CM | POA: Insufficient documentation

## 2019-04-23 NOTE — Telephone Encounter (Signed)
Returned call: Patient was in need of a new mask because the one he was given is too big. He has an appointment with [facility] today to get one that fits better. Pt was agreeable to this treatment.

## 2019-04-24 ENCOUNTER — Telehealth: Payer: Self-pay

## 2019-04-24 ENCOUNTER — Telehealth: Payer: Self-pay | Admitting: *Deleted

## 2019-04-24 NOTE — Telephone Encounter (Signed)
Patient is aware and agreeable to AHI being within range at 1.9. Patient is aware and agreeable to being in compliance with machine usage Patient is aware and agreeable to no change in current pressures   

## 2019-04-24 NOTE — Telephone Encounter (Signed)
Notes recorded by Sigurd Sos, RN on 04/24/2019 at 8:34 AM EDT The patient has been notified of the Renal US results and verbalized understanding. All questions (if any) were answered. Sigurd Sos, RN 04/24/2019 8:34 AM

## 2019-04-24 NOTE — Telephone Encounter (Signed)
-----   Message from Quintella Reichert, MD sent at 04/24/2019  8:02 AM EDT ----- No evidence of renal artery stenosis

## 2019-04-24 NOTE — Telephone Encounter (Signed)
-----   Message from Quintella Reichert, MD sent at 04/24/2019  8:00 AM EDT ----- Good AHI and compliance.  Continue current PAP settings.

## 2019-05-11 ENCOUNTER — Telehealth (INDEPENDENT_AMBULATORY_CARE_PROVIDER_SITE_OTHER): Payer: PRIVATE HEALTH INSURANCE | Admitting: Cardiology

## 2019-05-11 DIAGNOSIS — I10 Essential (primary) hypertension: Secondary | ICD-10-CM

## 2019-05-11 NOTE — Telephone Encounter (Signed)
New Message            Patient is calling to see if he can take "Hydroxythloroquine 200 mg"  Another doctor prescribe the medication to the patient for arthritis  The patient states he never gets a call back. Pls call to advise.

## 2019-05-11 NOTE — Telephone Encounter (Signed)
The patient was prescribed Hrdoxychloroquine 200 mg, twice a day. He wants to know if this is safe for his heart.

## 2019-05-12 NOTE — Telephone Encounter (Signed)
Spoke to the patient, he is going to come to the office on 05/15/19 and I will perform an EKG. He is arriving at 10 am. He had no further questions.

## 2019-05-12 NOTE — Telephone Encounter (Signed)
He has no history of prolonged QTc so if he is going to start hydroxychloroquine would repeat an EKG 1 week after starting

## 2019-05-12 NOTE — Telephone Encounter (Signed)
Follow up   Per the previous message patient would like a call back to discuss the medication.

## 2019-05-14 DIAGNOSIS — N5089 Other specified disorders of the male genital organs: Secondary | ICD-10-CM | POA: Insufficient documentation

## 2019-05-15 NOTE — Telephone Encounter (Signed)
EKG reviewed today showing NSR with normal QT interval - please find out how many days he has been on hydroxychloroquine

## 2019-05-15 NOTE — Telephone Encounter (Signed)
The patient started on 05/07/19, 8 days ago.

## 2019-06-24 ENCOUNTER — Other Ambulatory Visit: Payer: Self-pay | Admitting: Registered Nurse

## 2019-06-24 NOTE — Telephone Encounter (Signed)
Electronically approved flonase nasal spray refill for patient to his pharmacy of choice

## 2019-07-13 ENCOUNTER — Encounter: Payer: Self-pay | Admitting: Internal Medicine

## 2019-07-13 ENCOUNTER — Other Ambulatory Visit: Payer: Self-pay

## 2019-07-13 ENCOUNTER — Telehealth: Payer: Self-pay | Admitting: Cardiology

## 2019-07-13 ENCOUNTER — Ambulatory Visit (INDEPENDENT_AMBULATORY_CARE_PROVIDER_SITE_OTHER): Payer: PRIVATE HEALTH INSURANCE | Admitting: Internal Medicine

## 2019-07-13 VITALS — BP 126/67 | HR 76 | Ht 69.0 in | Wt 245.6 lb

## 2019-07-13 DIAGNOSIS — R42 Dizziness and giddiness: Secondary | ICD-10-CM | POA: Diagnosis not present

## 2019-07-13 NOTE — Telephone Encounter (Signed)
Pt and dtr both on phone. Reporting bradycardia this morning.   Per BP cuff, HRs this morning 39 and 35.   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 Pt reports being fatigued/tired.   Denies dizziness/light-headedness/syncope. Advised to keep monitoring and that I would forward this to Dr. [name] for advisement. Aware office would call back today once advised by MD. Both pt and dtr agreeable to plan.

## 2019-07-13 NOTE — Progress Notes (Signed)
Cardiology Office Note   Date:  07/13/2019   ID:  Ryan Glass, Ryan Glass 1956/11/30, MRN [id number]  PCP:  Ryan Chessman, MD  Cardiologist:   Ryan Pates, MD   Patient presents for evaluation of slow heart rate      History of Present Illness: Ryan Glass is a 63 y.o. male with a history of  PVCs.(22% on holter monitor in past)  He is s/p PVC ablation by Rosette Reveal in NOv 2019. PVCs were from RV inflow.  After the procedure he was taken off of b blocker.     The patient also has a hx of dilated aortic root, HTN, chest pain and LE edema   He was last seen in cardiology clinic by Ryan Glass in Feb 2020 At that visit he was placed back on b blocker because of LE swelling with amlodipine   The pt called in today   Said his HR was low and he was feeling a little sleepy   No dizziness    HR on BP monitor in 30s   Told to come in TOday he took BP and BP cuff showed HR in 30;s    BP 130s to 150s/   He says prior to today HR has not been recorded this low  The patient says his breathing  is OK  He denies  chest pressure   No dizziness   No outpatient medications have been marked as taking for the 07/13/19 encounter (Office Visit) with Pricilla Riffle, MD.     Allergies:   Amlodipine besylate and Aspirin   Past Medical History:  Diagnosis Date  . Hyperlipidemia LDL goal <70   . Hypertension   . PVC's (premature ventricular contractions)    Status post PVC ablation by Dr. Ladona Ridgel 2019  . Thoracic aortic aneurysm (HCC)    46 mm aortic root and 39 mm ascending aorta by echo 2019.  Chest CT 05/2018 showed 4.3 cm thoracic aorta.  . Umbilical hernia     Past Surgical History:  Procedure Laterality Date  . PVC ABLATION N/A 10/07/2018   Procedure: PVC ABLATION;  Surgeon: Marinus Maw, MD;  Location: [facility] INVASIVE CV LAB;  Service: Cardiovascular;  Laterality: N/A;     Social History:  The patient  reports that he has never smoked. He has never used smokeless tobacco. He reports current alcohol  use. He reports that he does not use drugs.   Family History:  The patient's family history is not on file.    ROS:  Please see the history of present illness. All other systems are reviewed and  Negative to the above problem except as noted.    PHYSICAL EXAM: VS:  BP 126/67   Pulse 76   Ht 5\' 9"  (1.753 m)   Wt 245 lb 9.6 oz (111.4 kg)   BMI 36.27 kg/m   GEN: Well nourished, well developed, in no acute distress  HEENT: normal  Neck: no JVD, carotid bruits, or masses Cardiac: RRR; no murmurs, rubs, or gallops,no edema  Respiratory:  clear to auscultation bilaterally, normal work of breathing GI: soft, nontender, nondistended, + BS  No hepatomegaly  MS: no deformity Moving all extremities   Skin: warm and dry, no rash Neuro:  Strength and sensation are intact Psych: euthymic mood, full affect   EKG:  EKG is ordered today.  SR 76 bpm with ventricular bigeminy   (PVCs) in LBBB morphology     Lipid Panel  Component Value Date/Time   CHOL 124 09/18/2018 0850   TRIG 126 09/18/2018 0850   HDL 38 (L) 09/18/2018 0850   CHOLHDL 3.3 09/18/2018 0850   LDLCALC 61 09/18/2018 0850      Wt Readings from Last 3 Encounters:  07/13/19 245 lb 9.6 oz (111.4 kg)  01/16/19 245 lb 12.8 oz (111.5 kg)  11/05/18 247 lb (112 kg)      ASSESSMENT AND PLAN:  1  Bradycardia   Pt reports HR from BP monitor of 30s  BP OK   EKG today shows frequent PVCs   Most likely monitor was not able to accurately sense these and read out HR as low     Pt notes some sleepiness but no dizziness  BP again  is OK    2  PVCs   I have reviewed EKG from today with Ryan Glass    It is very different from the preablation  EKG   Today the PVCs appear to be coming from high in LV      Would recomm that the pt get a holter montor to define burden; confirm no other morphologies Will  Also set f/u with Ryan Glass after holter.  3  HTN  Continue current meds        4  CP  Denies     Current medicines are reviewed at  length with the patient today.  The patient does not have concerns regarding medicines.  Signed, Dorris Carnes, MD  07/13/2019 3:31 PM    [facility] [address], [place], [place]  [address] Phone: [phone]; Fax: [phone]

## 2019-07-13 NOTE — Patient Instructions (Addendum)
Medication Instructions:  Your physician recommends that you continue on your current medications as directed. Please refer to the Current Medication list given to you today.  If you need a refill on your cardiac medications before your next appointment, please call your pharmacy.   Lab work: None If you have labs (blood work) drawn today and your tests are completely normal, you will receive your results only by: Marland Kitchen MyChart Message (if you have MyChart) OR . A paper copy in the mail If you have any lab test that is abnormal or we need to change your treatment, we will call you to review the results.  Testing/Procedures: None  Follow-Up: Will be determined later once Dr. [name] reviews your case with another [name].  Any Other Special Instructions Will Be Listed Below (If Applicable).

## 2019-07-13 NOTE — Telephone Encounter (Signed)
New message   STAT if HR is under 50 or over 120 (normal HR is 60-100 beats per minute)  1) What is your heart rate? 39   2) Do you have a log of your heart rate readings (document readings)? yes  3) Do you have any other symptoms? No

## 2019-07-13 NOTE — Telephone Encounter (Signed)
Pt advised of Dr. Theodosia Blender recommendation. Pt agreeable to appt this afternoon w/ DOD, Dr. [name]. Pt aware to wear mask and he will be screened downstairs.      COVID-19 Pre-Screening Questions:  . In the past 7 to 10 days have you had a cough,  shortness of breath, headache, congestion, fever (100 or greater) body aches, chills, sore throat, or sudden loss of taste or sense of smell? NO . Have you been around anyone with known Covid 19.  NO . Have you been around anyone who is awaiting Covid 19 test results in the past 7 to 10 days?  NO . Have you been around anyone who has been exposed to Covid 19, or has mentioned symptoms of Covid 19 within the past 7 to 10 days?  NO  If you have any concerns/questions about symptoms patients report during screening (either on the phone or at threshold). Contact the provider seeing the patient or DOD for further guidance.  If neither are available contact a member of the leadership team.

## 2019-07-13 NOTE — Telephone Encounter (Signed)
Needs to be seen in office today  

## 2019-07-14 ENCOUNTER — Telehealth: Payer: Self-pay | Admitting: Internal Medicine

## 2019-07-14 DIAGNOSIS — I493 Ventricular premature depolarization: Secondary | ICD-10-CM

## 2019-07-14 DIAGNOSIS — R002 Palpitations: Secondary | ICD-10-CM

## 2019-07-14 NOTE — Telephone Encounter (Signed)
SPoke to pt Reviewed EKG with Beckie Salts and compared to previous from October 2019 (preablation)   PVCs are different  From different region  REcomm:   24 hour holter to define burden and morphology F?U in clinic with Beckie Salts in near future

## 2019-07-15 NOTE — Addendum Note (Signed)
Addended by: Stephani Police on: 07/15/2019 08:34 AM   Modules accepted: Orders

## 2019-07-15 NOTE — Telephone Encounter (Signed)
Spoke to the pt and advised him that I will be ordering his monitor and will have his appt made with [name] soon after the we have the results of his monitor... pt agreed and will wait for his phone calls to have this set up.

## 2019-07-17 ENCOUNTER — Telehealth: Payer: Self-pay | Admitting: Radiology

## 2019-07-17 NOTE — Telephone Encounter (Signed)
Enrolled patient for a 3 day Zio monitor to be mailed. Brief instructions were gone over with patient and he knows to expect the monitor to arrive in 3-4 days. 24hr holter was changed to a 3 day Zio for mailing purposes during covid. Patient was instructed he only needed to wear for 1 day per his dr.

## 2019-07-23 ENCOUNTER — Other Ambulatory Visit: Payer: Self-pay

## 2019-07-23 ENCOUNTER — Encounter: Payer: Self-pay | Admitting: Registered Nurse

## 2019-07-23 ENCOUNTER — Telehealth: Payer: Self-pay | Admitting: Registered Nurse

## 2019-07-23 ENCOUNTER — Ambulatory Visit: Payer: Self-pay | Admitting: *Deleted

## 2019-07-23 VITALS — BP 137/98 | HR 68

## 2019-07-23 DIAGNOSIS — I493 Ventricular premature depolarization: Secondary | ICD-10-CM

## 2019-07-23 DIAGNOSIS — I10 Essential (primary) hypertension: Secondary | ICD-10-CM

## 2019-07-23 DIAGNOSIS — Z0130 Encounter for examination of blood pressure without abnormal findings: Secondary | ICD-10-CM

## 2019-07-23 DIAGNOSIS — R002 Palpitations: Secondary | ICD-10-CM

## 2019-07-23 NOTE — Addendum Note (Signed)
Addended by: Beckie Busing on: 07/23/2019 12:28 PM   Modules accepted: Orders

## 2019-07-23 NOTE — Telephone Encounter (Signed)
Patient presented to clinic for blood draw due to palpitations and history of worsening when potassium low.  Last lab draw 04/2019 and results normal low compared to earlier in the year.  Patient is going to check his medication list at home and bring to [place] next week as clinic closed starting tomorrow until 18 Aug and we reconciled his medication list to the best of his memory today.  Patient reported rheumatology started him on hydroxychloroquine in May for arthritis and it has helped to decrease his diclofenac dose to 50mg  po BID prn sometimes only taking once a day.  Previously on diclofenac 75mg  po BID.  He is using tylenol with codeine with prn daily and stated working well to control his pain denied side effects. Discussed with patient hydroxychloroquine has been known to cause palpitations/arrthmias at times and he stated he had seen that on the news due to covid press notices on tv already.  I instructed him to notify his rheumatologist at follow up appt in September that he was having palpitations.   PCM restarted his amlodipine that cardiology discontinued due to BP still not at goal when he was at [facility] office per patient.  He has been taking his potassium and filling at [facility] instead of [employer] formulary.  Patient also reported gout flare knee last week.  Wondering if his uric acid also elevated.  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  He is still taking his lasix.  Patient A&Ox3, skin warm dry and pink, RRR no pedal edema respirations even and unlabored RA, BBS CTA, cap refill less than 2 seconds; PERRL gait sure and steady, radial pulses equal 2+/4 bilateral hand grasp equal in/out of chair without difficulty; spoke full sentences without difficulty wearing universal mask for visit in clinic.  Changed BMP to CMP so I could also evaluate uric acid  level today.  Discussed with patient to avoid concentrated sugars, alcohol, dehydration and take his prescribed medications every day.  Discussed with patient I would call him if any of his labs require medication dose changes otherwise he would receive results message via mychart and he can contact clinic next Tuesday if further questions.  He is to follow up with cardiology as scheduled for his current palpations and seek re-evaluation if worsening with cardiology/ER.  Patient verbalized understanding information/instructions, agreed with plan of care and had no further questions at this time.

## 2019-07-23 NOTE — Addendum Note (Signed)
Addended by: Gerarda Fraction A on: 07/23/2019 11:49 AM   Modules accepted: Orders

## 2019-07-23 NOTE — Progress Notes (Signed)
Pt reports he was seen last week at cardiologist for sleepiness and low HR. HR was recorded in the 30's on his home BP monitor. EKG in office noted frequent PVCs originating from LV, different site than prior, preablation. Order placed for 1 day Zio patch. Being mailed to pt, expecting by end of week. Once zio patch results are back, pt to f/u with EP MD [name]. If repeat ablation needed, pt wants to complete prior to October 1 for insurance deductible purposes.  HR noted to be WNL today. No PVCs or abnormal rhythm noted with manual radial pulse check. Asymptomatic today. BP elevated today. Has had caffeine, 2 cups of coffee today. He reports home BP readings are 130's/70's.

## 2019-07-23 NOTE — Patient Instructions (Signed)
Hypotension As your heart beats, it forces blood through your body. Hypotension, commonly called low blood pressure, is when the force of blood pumping through your arteries is too weak. Arteries are blood vessels that carry blood from the heart throughout the body. Depending on the cause and severity, hypotension may be harmless (benign) or may cause serious problems (be critical). When blood pressure is too low, you may not get enough blood to your brain or to the rest of your organs. This can cause weakness, light-headedness, rapid heartbeat, and fainting. What are the causes? This condition may be caused by:  Blood loss.  Loss of body fluids (dehydration).  Heart problems.  Hormone (endocrine) problems.  Pregnancy.  Severe infection.  Lack of certain nutrients.  Severe allergic reactions (anaphylaxis).  Certain medicines, such as blood pressure medicine or medicines that make the body lose excess fluids (diuretics). Sometimes, hypotension may be caused by not taking medicine as directed, such as taking too much of a certain medicine. What increases the risk? The following factors may make you more likely to develop this condition:  Age. Risk increases as you get older.  Conditions that affect the heart or the central nervous system.  Taking certain medicines, such as blood pressure medicine or diuretics.  Being pregnant. What are the signs or symptoms? Common symptoms of this condition include:  Weakness.  Light-headedness.  Dizziness.  Blurred vision.  Fatigue.  Rapid heartbeat.  Fainting, in severe cases. How is this diagnosed? This condition is diagnosed based on:  Your medical history.  Your symptoms.  Your blood pressure measurement. Your health care provider will check your blood pressure when you are: ? Lying down. ? Sitting. ? Standing. A blood pressure reading is recorded as two numbers, such as "120 over 80" (or 120/80). The first ("top")  number is called the systolic pressure. It is a measure of the pressure in your arteries as your heart beats. The second ("bottom") number is called the diastolic pressure. It is a measure of the pressure in your arteries when your heart relaxes between beats. Blood pressure is measured in a unit called mm Hg. Healthy blood pressure for most adults is 120/80. If your blood pressure is below 90/60, you may be diagnosed with hypotension. Other information or tests that may be used to diagnose hypotension include:  Your other vital signs, such as your heart rate and temperature.  Blood tests.  Tilt table test. For this test, you will be safely secured to a table that moves you from a lying position to an upright position. Your heart rhythm and blood pressure will be monitored during the test. How is this treated? Treatment for this condition may include:  Changing your diet. This may involve eating more salt (sodium) or drinking more water.  Taking medicines to raise your blood pressure.  Changing the dosage of certain medicines you are taking that might be lowering your blood pressure.  Wearing compression stockings. These stockings help to prevent blood clots and reduce swelling in your legs. In some cases, you may need to go to the hospital for:  Fluid replacement. This means you will receive fluids through an IV.  Blood replacement. This means you will receive donated blood through an IV (transfusion).  Treating an infection or heart problems, if this applies.  Monitoring. You may need to be monitored while medicines that you are taking wear off. Follow these instructions at home: Eating and drinking   Drink enough fluid to keep your  urine pale yellow.  Eat a healthy diet, and follow instructions from your health care provider about eating or drinking restrictions. A healthy diet includes: ? Fresh fruits and vegetables. ? Whole grains. ? Lean meats. ? Low-fat dairy  products.  Eat extra salt only as directed. Do not add extra salt to your diet unless your health care provider told you to do that.  Eat frequent, small meals.  Avoid standing up suddenly after eating. Medicines  Take over-the-counter and prescription medicines only as told by your health care provider. ? Follow instructions from your health care provider about changing the dosage of your current medicines, if this applies. ? Do not stop or adjust any of your medicines on your own. General instructions   Wear compression stockings as told by your health care provider.  Get up slowly from lying down or sitting positions. This gives your blood pressure a chance to adjust.  Avoid hot showers and excessive heat as directed by your health care provider.  Return to your normal activities as told by your health care provider. Ask your health care provider what activities are safe for you.  Do not use any products that contain nicotine or tobacco, such as cigarettes, e-cigarettes, and chewing tobacco. If you need help quitting, ask your health care provider.  Keep all follow-up visits as told by your health care provider. This is important. Contact a health care provider if you:  Vomit.  Have diarrhea.  Have a fever for more than 2-3 days.  Feel more thirsty than usual.  Feel weak and tired. Get help right away if you:  Have chest pain.  Have a fast or irregular heartbeat.  Develop numbness in any part of your body.  Cannot move your arms or your legs.  Have trouble speaking.  Become sweaty or feel light-headed.  Faint.  Feel short of breath.  Have trouble staying awake.  Feel confused. Summary  Hypotension is when the force of blood pumping through your arteries is too weak.  Hypotension may be harmless (benign) or may cause serious problems (be critical).  Treatment for this condition may include changing your diet, changing your medicines, and wearing  compression stockings.  In some cases, you may need to go to the hospital for fluid or blood replacement. This information is not intended to replace advice given to you by your health care provider. Make sure you discuss any questions you have with your health care provider. Document Released: 11/26/2005 Document Revised: 05/22/2018 Document Reviewed: 05/22/2018 Elsevier Patient Education  [place]. Palpitations Palpitations are feelings that your heartbeat is irregular or is faster than normal. It may feel like your heart is fluttering or skipping a beat. Palpitations are usually not a serious problem. They may be caused by many things, including smoking, caffeine, alcohol, stress, and certain medicines or drugs. Most causes of palpitations are not serious. However, some palpitations can be a sign of a serious problem. You may need further tests to rule out serious medical problems. Follow these instructions at home:     Pay attention to any changes in your condition. Take these actions to help manage your symptoms: Eating and drinking  Avoid foods and drinks that may cause palpitations. These may include: ? Caffeinated coffee, tea, soft drinks, diet pills, and energy drinks. ? Chocolate. ? Alcohol. Lifestyle  Take steps to reduce your stress and anxiety. Things that can help you relax include: ? Yoga. ? Mind-body activities, such as deep breathing, meditation,  or using words and images to create positive thoughts (guided imagery). ? Physical activity, such as swimming, jogging, or walking. Tell your health care provider if your palpitations increase with activity. If you have chest pain or shortness of breath with activity, do not continue the activity until you are seen by your health care provider. ? Biofeedback. This is a method that helps you learn to use your mind to control things in your body, such as your heartbeat.  Do not use drugs, including cocaine or ecstasy. Do  not use marijuana.  Get plenty of rest and sleep. Keep a regular bed time. General instructions  Take over-the-counter and prescription medicines only as told by your health care provider.  Do not use any products that contain nicotine or tobacco, such as cigarettes and e-cigarettes. If you need help quitting, ask your health care provider.  Keep all follow-up visits as told by your health care provider. This is important. These may include visits for further testing if palpitations do not go away or get worse. Contact a health care provider if you:  Continue to have a fast or irregular heartbeat after 24 hours.  Notice that your palpitations occur more often. Get help right away if you:  Have chest pain or shortness of breath.  Have a severe headache.  Feel dizzy or you faint. Summary  Palpitations are feelings that your heartbeat is irregular or is faster than normal. It may feel like your heart is fluttering or skipping a beat.  Palpitations may be caused by many things, including smoking, caffeine, alcohol, stress, certain medicines, and drugs.  Although most causes of palpitations are not serious, some causes can be a sign of a serious medical problem.  Get help right away if you faint or have chest pain, shortness of breath, a severe headache, or dizziness. This information is not intended to replace advice given to you by your health care provider. Make sure you discuss any questions you have with your health care provider. Document Released: 11/23/2000 Document Revised: 01/08/2018 Document Reviewed: 01/08/2018 Elsevier Patient Education  2020 [employer].

## 2019-07-23 NOTE — Progress Notes (Signed)
Patient with history of hypokalemia on supplementation potassium chloride and last labs May 2020 low normal.  Will do BMP and magnesium nonfasting today.  Noted in patient history of hydroxychloroquine use in past 3 months also.  Has seen cardiology and awaiting zio patch for cardiac monitoring arrival to his house via mail.  BP stable in clinic today.  Avoid known palpitation triggers e.g. not taking potassium supplement oral, increased coffee intake, dehydration.

## 2019-07-24 LAB — COMPREHENSIVE METABOLIC PANEL
ALT: 29 IU/L (ref 0–44)
AST: 30 IU/L (ref 0–40)
Albumin/Globulin Ratio: 2.3 — ABNORMAL HIGH (ref 1.2–2.2)
Albumin: 4.9 g/dL — ABNORMAL HIGH (ref 3.8–4.8)
Alkaline Phosphatase: 85 IU/L (ref 39–117)
BUN/Creatinine Ratio: 16 (ref 10–24)
BUN: 24 mg/dL (ref 8–27)
Bilirubin Total: 0.5 mg/dL (ref 0.0–1.2)
CO2: 21 mmol/L (ref 20–29)
Calcium: 9.5 mg/dL (ref 8.6–10.2)
Chloride: 101 mmol/L (ref 96–106)
Creatinine, Ser: 1.47 mg/dL — ABNORMAL HIGH (ref 0.76–1.27)
GFR calc Af Amer: 58 mL/min/{1.73_m2} — ABNORMAL LOW (ref >59)
GFR calc non Af Amer: 50 mL/min/{1.73_m2} — ABNORMAL LOW (ref >59)
Globulin, Total: 2.1 g/dL (ref 1.5–4.5)
Glucose: 84 mg/dL (ref 65–99)
Potassium: 3.4 mmol/L — ABNORMAL LOW (ref 3.5–5.2)
Sodium: 139 mmol/L (ref 134–144)
Total Protein: 7 g/dL (ref 6.0–8.5)

## 2019-07-24 LAB — MAGNESIUM: Magnesium: 2 mg/dL (ref 1.6–2.3)

## 2019-07-26 ENCOUNTER — Other Ambulatory Visit (INDEPENDENT_AMBULATORY_CARE_PROVIDER_SITE_OTHER): Payer: PRIVATE HEALTH INSURANCE

## 2019-07-26 DIAGNOSIS — I493 Ventricular premature depolarization: Secondary | ICD-10-CM

## 2019-07-26 DIAGNOSIS — R002 Palpitations: Secondary | ICD-10-CM | POA: Diagnosis not present

## 2019-07-27 ENCOUNTER — Telehealth: Payer: Self-pay | Admitting: Internal Medicine

## 2019-07-27 NOTE — Telephone Encounter (Signed)
Patient instructed monitor was ordered by Dr. For 24 hours.  In order to have monitor mailed to patient during covid, we need to order long term holter monitors which are a minimum of 72 hours.  Patient may remove monitor after 24 hours or wear up to 3 days before mailing back to [facility].

## 2019-07-27 NOTE — Telephone Encounter (Signed)
  Patient got the monitor and is confused about if he is to wear the monitor for the three days or just 24 hours. Please call to advise. Can leave detailed message if no answer.

## 2019-07-28 NOTE — Progress Notes (Signed)
Results sent to Ardine Eng, cardiology [place], and EP [name]. Will f/u with pt to schedule nonfasting BMET recheck today. [facility] called to add on Uric acid. Awaiting sample availability confirmation fax.

## 2019-07-28 NOTE — Progress Notes (Signed)
noted 

## 2019-07-30 ENCOUNTER — Ambulatory Visit: Payer: Self-pay | Admitting: *Deleted

## 2019-07-30 ENCOUNTER — Other Ambulatory Visit: Payer: Self-pay

## 2019-07-30 DIAGNOSIS — I493 Ventricular premature depolarization: Secondary | ICD-10-CM

## 2019-07-30 NOTE — Progress Notes (Signed)
Pt reviewed results in MyChart. Drew repeat BMP and uric acid today.  Removed Ziopatch yesterday and plans to mail in tonight.

## 2019-07-30 NOTE — Progress Notes (Signed)
Noted patient reported he has not noticed any palpitations since increasing potassium per my instructions on 24 Jul 2019.  Increased water a little but not too much as he knows this can lower potassium levels further.  Denied swelling hands/feet/muscle weakness/n/v/d.  Discussed lab results should be available tomorrow from [place] to follow up with RN [name].  Patient verbalized understanding information/instructions, agreed with plan of care and had no further questions at this time.

## 2019-07-31 ENCOUNTER — Telehealth: Payer: Self-pay | Admitting: Registered Nurse

## 2019-07-31 ENCOUNTER — Encounter: Payer: Self-pay | Admitting: Registered Nurse

## 2019-07-31 DIAGNOSIS — E876 Hypokalemia: Secondary | ICD-10-CM

## 2019-07-31 LAB — BASIC METABOLIC PANEL
BUN/Creatinine Ratio: 18 (ref 10–24)
BUN: 29 mg/dL — ABNORMAL HIGH (ref 8–27)
CO2: 21 mmol/L (ref 20–29)
Calcium: 9.4 mg/dL (ref 8.6–10.2)
Chloride: 102 mmol/L (ref 96–106)
Creatinine, Ser: 1.6 mg/dL — ABNORMAL HIGH (ref 0.76–1.27)
GFR calc Af Amer: 52 mL/min/{1.73_m2} — ABNORMAL LOW (ref >59)
GFR calc non Af Amer: 45 mL/min/{1.73_m2} — ABNORMAL LOW (ref >59)
Glucose: 86 mg/dL (ref 65–99)
Potassium: 3.7 mmol/L (ref 3.5–5.2)
Sodium: 140 mmol/L (ref 134–144)

## 2019-07-31 LAB — URIC ACID: Uric Acid: 9.8 mg/dL — ABNORMAL HIGH (ref 3.7–8.6)

## 2019-07-31 MED ORDER — SALINE SPRAY 0.65 % NA SOLN: 2.0000 | NASAL | 0 refills | Status: DC

## 2019-07-31 MED ORDER — POTASSIUM CHLORIDE CRYS ER 20 MEQ PO TBCR: EXTENDED_RELEASE_TABLET | ORAL | 0 refills | Status: DC

## 2019-07-31 NOTE — Telephone Encounter (Signed)
Ryan Glass this message keeps coming to me I did send him the low purine diet. Will you follow up with him. I know Ryan Glass is not in the office today.

## 2019-07-31 NOTE — Telephone Encounter (Signed)
I will. Thanks so much.

## 2019-07-31 NOTE — Telephone Encounter (Signed)
See results note hypokalemia resolved with change in potassium chloride dosing (36meq am/hs and 59meq lunch) started 14 Aug but worsening renal function GFR less than 60 and BUN/Cr worsening.  Patient reported started taking amino acid supplement recently.  Will have patient stop use and repeat BMET in one week.  If despite stopping amino acids and hydrating renal function continues to worsen patient will need to discuss stopping nsaids with rheumatology/PCM and discuss his BP meds with cardiology.  Uric acid level elevated patient to avoid known foods to elevate uric acid level and RN [name] to review handout with him today and give him copy.

## 2019-07-31 NOTE — Telephone Encounter (Signed)
Thanks

## 2019-07-31 NOTE — Progress Notes (Signed)
Mesg left on pt's work VM regarding results and notes as above. Have been unable to reach pt today. Laverna Peace NP received pt's question regarding uric acid level and responded to him with instructions on a low purine diet through MyChart. Advised pt on med changes as above and requested he email RN regarding his Gabapentin dosing and knee pain. Advised of repeat labs in one week and sent email calendar invite. Results forwarded to pcp, cardiology, and rheumatology.

## 2019-08-01 NOTE — Telephone Encounter (Signed)
Noted NP [place] sent patient low purine handout.  Will follow up with him on Monday to see if further questions.

## 2019-08-03 NOTE — Progress Notes (Signed)
Spoke with pt in clinic. Reviewed all lab results again. Scheduled him for repeat nonfasting labs for kidney func on Thurs 8/27 at 0930. He has continued his 2 am,1 lunch, 2 pm K+ dosing.   Reviewed possible causes of elevated kidney func results. He reports that he only took half dose of the amino acid supplement for a couple of weeks, not every day, and stopped taking it 3-4 weeks ago.  He also stopped Diclofenac on Friday 07/31/19 due to his concerns regarding kidney func. Also stopped Tylenol #3 as he reported no improvements in pain with this. Sts he was told by his arthritis Dr that she has no further treatment options for him, so he is going to try to find a different provider.  Questioned if he could take a half dose of his Chlorthalidone due to the strong effect of the diuretic. Directed him to discuss this with cardiology, Dr. [name] since she is now managing his BP, and not to adjust or stop BP meds without speaking to her.   Sts he is drinking about 3 30-40oz bottles of water a day. Reports urine is pale yellow.   Confirms he is taking (1) 600mg  tablet of Gabapentin BID.

## 2019-08-04 NOTE — Progress Notes (Signed)
Noted patient has had follow up; repeat labs last drawn 07/30/2019 and next scheduled 08/06/2019

## 2019-08-06 ENCOUNTER — Other Ambulatory Visit: Payer: Self-pay

## 2019-08-06 ENCOUNTER — Ambulatory Visit: Payer: Self-pay | Admitting: *Deleted

## 2019-08-06 VITALS — BP 141/99 | HR 67

## 2019-08-06 DIAGNOSIS — E876 Hypokalemia: Secondary | ICD-10-CM

## 2019-08-06 NOTE — Progress Notes (Signed)
Nonfasting labs to repeat for kidney function. No antiinflammatories at home in 6 days.

## 2019-08-07 ENCOUNTER — Other Ambulatory Visit: Payer: Self-pay | Admitting: Internal Medicine

## 2019-08-07 DIAGNOSIS — I493 Ventricular premature depolarization: Secondary | ICD-10-CM

## 2019-08-07 DIAGNOSIS — R002 Palpitations: Secondary | ICD-10-CM

## 2019-08-07 LAB — BASIC METABOLIC PANEL
BUN/Creatinine Ratio: 17 (ref 10–24)
BUN: 24 mg/dL (ref 8–27)
CO2: 21 mmol/L (ref 20–29)
Calcium: 9.5 mg/dL (ref 8.6–10.2)
Chloride: 102 mmol/L (ref 96–106)
Creatinine, Ser: 1.45 mg/dL — ABNORMAL HIGH (ref 0.76–1.27)
GFR calc Af Amer: 59 mL/min/{1.73_m2} — ABNORMAL LOW (ref >59)
GFR calc non Af Amer: 51 mL/min/{1.73_m2} — ABNORMAL LOW (ref >59)
Glucose: 88 mg/dL (ref 65–99)
Potassium: 3.7 mmol/L (ref 3.5–5.2)
Sodium: 140 mmol/L (ref 134–144)

## 2019-08-07 NOTE — Progress Notes (Signed)
noted 

## 2019-08-07 NOTE — Progress Notes (Signed)
Unable to reach by phone and did not see in dept earlier today. Pt has viewed results in [facility]. Sent email to pt's work email with summary of results and recommendations. Also emailed calendar invite for repeat non fasting labs in one month, 09/03/19 at 0930.

## 2019-08-12 ENCOUNTER — Ambulatory Visit: Payer: PRIVATE HEALTH INSURANCE | Admitting: Physician Assistant

## 2019-08-13 ENCOUNTER — Telehealth: Payer: Self-pay | Admitting: Physician Assistant

## 2019-08-13 ENCOUNTER — Ambulatory Visit (INDEPENDENT_AMBULATORY_CARE_PROVIDER_SITE_OTHER): Payer: PRIVATE HEALTH INSURANCE | Admitting: Physician Assistant

## 2019-08-13 ENCOUNTER — Encounter: Payer: Self-pay | Admitting: Physician Assistant

## 2019-08-13 ENCOUNTER — Encounter: Payer: Self-pay | Admitting: *Deleted

## 2019-08-13 ENCOUNTER — Other Ambulatory Visit: Payer: Self-pay

## 2019-08-13 VITALS — BP 124/71 | HR 76 | Ht 70.0 in | Wt 241.0 lb

## 2019-08-13 DIAGNOSIS — I493 Ventricular premature depolarization: Secondary | ICD-10-CM

## 2019-08-13 DIAGNOSIS — R0602 Shortness of breath: Secondary | ICD-10-CM | POA: Diagnosis not present

## 2019-08-13 DIAGNOSIS — I10 Essential (primary) hypertension: Secondary | ICD-10-CM

## 2019-08-13 MED ORDER — NEBIVOLOL HCL 10 MG PO TABS: 10.0000 mg | ORAL_TABLET | Freq: Every day | ORAL | 3 refills | Status: DC

## 2019-08-13 NOTE — Patient Instructions (Addendum)
Medication Instructions:   START TAKING BYSTOLIC  10 MG ONCE A DAY    If you need a refill on your cardiac medications before your next appointment, please call your pharmacy.   Lab work: NONE ORDERED  TODAY   If you have labs (blood work) drawn today and your tests are completely normal, you will receive your results only by: [facility] MyChart Message (if you have MyChart) OR . A paper copy in the mail If you have any lab test that is abnormal or we need to change your treatment, we will call you to review the results.  Testing/Procedures: Your physician has requested that you have a lexiscan myoview. For further information please visit [email or web address]. Please follow instruction sheet, as given.  Follow-Up: At [facility], you and your health needs are our priority.  As part of our continuing mission to provide you with exceptional heart care, we have created designated Provider Care Teams.  These Care Teams include your primary Cardiologist (physician) and Advanced Practice Providers (APPs -  Physician Assistants and Nurse Practitioners) who all work together to provide you with the care you need, when you need it. You will need a follow up appointment in 1 months. You may see Dr. [name] or one of the following Advanced Practice Providers on your designated Care Team:   [place], PA-C Melina Copa, PA-C . Ermalinda Barrios, PA-C  Any Other Special Instructions Will Be Listed Below (If Applicable).

## 2019-08-13 NOTE — Telephone Encounter (Signed)
Spoke to the patient that we will not be doing the stress test noted after he left had a stress test last year.  He had not scheduled it yet.  I reviewed that his stress test last year was normal, and to further discuss with Dr. [name] any further evaluation she may think is needed.  Let him know as well that I spoke with [name], no need for ablation at this junctur, and agreed with increased BB dose.  He appreciated the follow up call  [name], PA-C

## 2019-08-13 NOTE — Progress Notes (Addendum)
Cardiology Office Note Date:  08/13/2019  Patient ID:  Ryan Glass, Ryan Glass [birth year], MRN [id number] PCP:  [name], MD  Cardiologist/OSA: Dr. [name] Electrophysiologist: Dr. [name]     Chief Complaint: f/u on event monitor  History of Present Illness: Ryan Glass is a 63 y.o. male with history of HTN, HLD, dilated Ao root, and PVC (w.th symptoms of palpitations and fatigue). And OSA (noncompliant with CPAP), chronic pain following wwith rheumatologist "arthralgia", gout  He was referred to [name] after monitoring noted 20K PVC despite being on a BB and underwent PVC ablation Oct/2019. At his f/u with [name] in Nov, he was feeling better and wanted to get off the BB.  Was planned to wean off. He saw Dr. [name] last 01/16/19, he was feeling well, BP not controlled and not using his CPAP, as well having issues with edema, she stopped his amlodipine and started bystolic 5mg  daily It seems after this there was some confusion on how he was taking his meds, he had elevated BP, ultimately his Voltaren and hyzaar were stopped, started on  losartan 100mg  daily Chlorthaidone 25mg  daily Hydralazine 10mg  TID Planned for labs and renal US (noted no RAS)  He had a work in visit with Dr. Leonard Downing 07/13/2019 for findings by his home BP monitor with HR 30's and asked to be seen.  She noted that he had PVCs again, and these likely cause for BP machine report, EKG was reviewed with [name], PVCs were different then the pre-ablation PVCs, and recommended EM to identify frequency, if any other morphologies and follow up.  2 day EM noted  20629 PVCs, 8.2% Patient had a min HR of 41 bpm, max HR of 146 bpm, and avg HR of 62 bpm. Predominant underlying rhythm was Sinus Rhythm. First Degree AV Block was present. Bundle Branch Block/IVCD was present. 1 run of Supraventricular Tachycardia occurred lasting 5 beats with a max rate of 146 bpm (avg 142 bpm). Isolated SVEs were rare (<1.0%), SVE  Couplets were rare (<1.0%), and SVE Triplets were rare (<1.0%). Isolated VEs were frequent (8.2%, 20629), VE Couplets were rare (<1.0%, 527), and VE Triplets were rare (<1.0%, 10). Ventricular Bigeminy and Trigeminy were Present.  PVCs in my review appear to be predominantly of a single morphology though did have another, he also had SR with and without BBB  The patient denies palpitations, skipped, extra beats, no CP or cardiac awareness.  No dizziness, near syncope or syncope. He is still getting winded.  He denies any rest SOB, no symptoms of PND or orthopnea.  No DOE with casual paced walking.  Though with increased pace of walking, staris, or even mild exertional activities he gets SOB.  He moved his daughter to college and carrying her things back/forth, up/down steps made him very SOB.  NO CP.  His BP has been better at home the last couple weeks, and feels like he feels more relaxed in general.  Reports that he is using his CPAP nightly since seeing Dr. [name] last    Past Medical History:  Diagnosis Date  . Hyperlipidemia LDL goal <70   . Hypertension   . PVC's (premature ventricular contractions)    Status post PVC ablation by [name] 2019  . Thoracic aortic aneurysm (HCC)    46 mm aortic root and 39 mm ascending aorta by echo 2019.  Chest CT 05/2018 showed 4.3 cm thoracic aorta.  . Umbilical hernia     Past  Surgical History:  Procedure Laterality Date  . PVC ABLATION N/A 10/07/2018   Procedure: PVC ABLATION;  Surgeon: Marinus Mawaylor, Gregg W, MD;  Location: [facility];  Service: Cardiovascular;  Laterality: N/A;    Current Outpatient Medications  Medication Sig Dispense Refill  . acetaminophen-codeine (TYLENOL #3) 300-30 MG tablet     . amLODipine (NORVASC) 5 MG tablet Take 1 tablet by mouth daily.    Marland Kitchen. atorvastatin (LIPITOR) 20 MG tablet Take 1 tablet (20 mg total) by mouth daily. 90 tablet 3  . chlorthalidone (HYGROTON) 25 MG tablet Take 1 tablet (25 mg total) by  mouth daily. 90 tablet 3  . colchicine 0.6 MG tablet Take 1-2 tablets by mouth daily as needed (gout).     Marland Kitchen. diclofenac (CATAFLAM) 50 MG tablet     . EUFLEXXA 20 MG/2ML SOSY     . fluticasone (FLONASE) 50 MCG/ACT nasal spray INSTILL 1 SPRAY INTO BOTH NOSTRILS TWO TIMES A DAY 16 g 5  . furosemide (LASIX) 40 MG tablet Take 1 tablet (40 mg total) by mouth daily. 90 tablet 3  . gabapentin (NEURONTIN) 600 MG tablet     . hydrALAZINE (APRESOLINE) 25 MG tablet Take 1 tablet (25 mg total) by mouth 3 (three) times daily. 270 tablet 3  . hydroxychloroquine (PLAQUENIL) 200 MG tablet Take by mouth.    . losartan (COZAAR) 100 MG tablet Take 1 tablet (100 mg total) by mouth daily. 90 tablet 3  . montelukast (SINGULAIR) 10 MG tablet Take 1 tablet (10 mg total) by mouth at bedtime. 90 tablet 3  . nabumetone (RELAFEN) 750 MG tablet Relafen 750 mg tablet  Take 1 tablet twice a day by oral route.    . nebivolol (BYSTOLIC) 10 MG tablet Take 1 tablet (10 mg total) by mouth daily. 90 tablet 3  . Potassium Chloride ER 20 MEQ TBCR Take 40 mg by mouth 2 (two) times daily. 486 tablet 3  . potassium chloride SA (K-DUR) 20 MEQ tablet Take 2 tablets (40 mEq total) by mouth 2 (two) times daily in the am and at bedtime. AND 1 tablet (20 mEq total) daily with lunch. 150 tablet 0  . sodium chloride (OCEAN) 0.65 % SOLN nasal spray Place 2 sprays into both nostrils every 2 (two) hours while awake.  0   No current facility-administered medications for this visit.     Allergies:   Amlodipine besylate and Aspirin   Social History:  The patient  reports that he has never smoked. He has never used smokeless tobacco. He reports current alcohol use. He reports that he does not use drugs.   Family History:  The patient's family history includes Cardiomyopathy in his mother; Heart attack in his father.  ROS:  Please see the history of present illness.  All other systems are reviewed and otherwise negative.   PHYSICAL EXAM:  VS:   BP 124/71   Pulse 76   Ht 5\' 10"  (1.778 m)   Wt 241 lb (109.3 kg)   BMI 34.58 kg/m  BMI: Body mass index is 34.58 kg/m. Well nourished, well developed, in no acute distress  HEENT: normocephalic, atraumatic  Neck: no JVD, carotid bruits or masses Cardiac:  RRR; (one extrasystole heard in 60 seconds),no significant murmurs, no rubs, or gallops Lungs:  CTA b/l, no wheezing, rhonchi or rales  Abd: soft, nontender, obese MS: no deformity or atrophy Ext: no edema  Skin: warm and dry, no rash Neuro:  No gross deficits appreciated Psych: euthymic  mood, full affect   EKG:  Not done today   08/07/2019: 2 day monitor [id number] PVC, 8.2% Patient had a min HR of 41 bpm, max HR of 146 bpm, and avg HR of 62 bpm. Predominant underlying rhythm was Sinus Rhythm. First Degree AV Block was present. Bundle Branch Block/IVCD was present. 1 run of Supraventricular Tachycardia occurred lasting 5 beats with a max rate of 146 bpm (avg 142 bpm). Isolated SVEs were rare (<1.0%), SVE Couplets were rare (<1.0%), and SVE Triplets were rare (<1.0%). Isolated VEs were frequent (8.2%, 20629), VE Couplets were rare (<1.0%, 527), and VE Triplets were rare (<1.0%, 10). Ventricular Bigeminy and Trigeminy were Present.  10/07/18: EPS/ablation Results. A. Baseline ECG - the baseline ECG demonstrates NSR with PAC's and PVC's. B. Baseline intervals. The sinus node cycle length was 850 ms.  The PR interval was 198 ms.  The QRS duration was 123 ms.  The HV interval was 60 ms.  The AH interval was 108 ms.  Following ablation, there is no significant change except the sinus node cycle length was 775 ms. C.  Rapid ventricular pacing.  Following ablation, rapid ventricular pacing demonstrated VA dissociation at 600 ms. D.  Programmed ventricular stimulation.  Programmed ventricular stimulation demonstrated VA dissociation at 600 ms. E.  Programmed atrial stimulation.  Programmed atrial stimulation was carried out following  ablation demonstrated an AV node ERP of 500/280.  There is no inducible SVT. F.  Rapid atrial pacing.  Rapid atrial pacing was carried out from the coronary sinus a pacing cycle length of 500 ms and decreased down to 350 ms or AV block was observed.  During rapid atrial pacing, the PR interval was equal to the RR interval but there is no inducible SVT. G.  Arrhythmias observed.  PVCs.  Originating from the RV inflow tract as previously described. H.  Three-dimensional electro-anatomic mapping.  Three-dimensional electro-anatomic mapping was carried out with CARTO.  Originally there was multiple PVC morphologies but very slightly different.  Ablation targeted to the PVC originating from the RV inflow tract, appeared to abolish all of the similarly appearing morphological PVCs. I.  RF energy application.  A total of 17 minutes of RF energy application utilizing a 4 mm irrigated ablation catheter were carried out.   04/11/18: TTE Study Conclusions - Left ventricle: The cavity size was mildly dilated. There was   moderate concentric hypertrophy. Systolic function was normal.   The estimated ejection fraction was in the range of 60% to 65%.   Wall motion was normal; there were no regional wall motion   abnormalities. Features are consistent with a pseudonormal left   ventricular filling pattern, with concomitant abnormal relaxation   and increased filling pressure (grade 2 diastolic dysfunction).   Doppler parameters are consistent with high ventricular filling   pressure. - Aorta: Aortic root dimension: 46 mm (ED). Ascending aortic   diameter: 39 mm (S). - Aortic root: The aortic root was moderately dilated. - Ascending aorta: The ascending aorta was mildly dilated. - Left atrium: The atrium was moderately dilated. - Right ventricle: The cavity size was mildly dilated. Wall   thickness was normal. - Tricuspid valve: There was trivial regurgitation.   Recent Labs: 09/25/2018: Hemoglobin 13.9;  Platelets 179 02/26/2019: TSH 2.260 07/23/2019: ALT 29; Magnesium 2.0 08/06/2019: BUN 24; Creatinine, Ser 1.45; Potassium 3.7; Sodium 140  09/18/2018: Chol/HDL Ratio 3.3; Cholesterol, Total 124; HDL 38; LDL Calculated 61; Triglycerides 126   Estimated Creatinine Clearance: 64.5 mL/min (A) (by  C-G formula based on SCr of 1.45 mg/dL (H)).   Wt Readings from Last 3 Encounters:  08/13/19 241 lb (109.3 kg)  07/13/19 245 lb 9.6 oz (111.4 kg)  01/16/19 245 lb 12.8 oz (111.5 kg)     Other studies reviewed: Additional studies/records reviewed today include: summarized above  ASSESSMENT AND PLAN:  1. PVCs     Prior EKG reviewed by Dr. Ladona Ridgel, morphology of PVC is different then prior     EM appears predominantly to have a single morphology (though had another)     Burden much lower (prior was 22% pre-ablation and a different morphology by notes)  Reviewed monitor findings via telephone with Dr. Ladona Ridgel, no repeat ablation, OK with up-titration of BB       2. HTN 3. Dilated ascending Ao 4. OSA (poor compliance with CPAP)     Recheck on his BP 136/76, I think he has room for additional BB given his Aorta/PVCs     Increase Bystolic to 10mg  daily     Dr.   5. DOE      Likely multifactorial with some DD and obesity, perhaps deconditioning as well     He has some DD on his echo though does not appear volume OL currently     He has no CP, though in an abundance of caution and his significant concerns will have him do a stress test to evaluate an anginal equivalent  ADDENDUM Noted after he left that he had a stress test last year as well...  I will cancel this and defer to Dr. Mayford Knife further work-up, for his DOE        Disposition: F/u with Dr. Mayford Knife in a month to f/u  Current medicines are reviewed at length with the patient today.  The patient did not have any concerns regarding medicines.  Adonis Housekeeper, PA-C 08/13/2019 5:09 PM     [facility] [address] [place] [phone] (office)  [phone] (fax)

## 2019-08-25 NOTE — Telephone Encounter (Signed)
With holter monitor showing much few PVCs would keep on same meds  Reviewed wih Beckie Salts  Does not need to see Dr [name]

## 2019-08-26 ENCOUNTER — Telehealth: Payer: Self-pay | Admitting: Physician Assistant

## 2019-08-26 NOTE — Telephone Encounter (Signed)
Paged by answering service for low blood pressure and pulse.  His blood pressure was 106/56 with pulse of 35 approximately 2 hours ago.  Wife found him very lethargic and drowsy about hour ago.  No syncope.  Patient is feeling fatigue and tired.  He took his losartan, bystolic and hydralazine this morning.  He takes his amlodipine in evening along with hydralazine and recent increase bystolic10 mg. He reports [name] increased Bystolic to 10mg  BID.   During my evaluation his vital improved to 120/74 with pulse of 66 bpm. He felt better.   I have advised patient and wife to hold off antihypertensive regimen this evening. IF SBP goes above 140 he make take hydralazine only.   IF worsening lethargy/dizziness or syncope overnight, patient will seek emergent medical attention. He will call our office in AM with vital reading prior to taking any medications.   Both patient and wife agree with plan and appreciate of call.

## 2019-08-27 ENCOUNTER — Other Ambulatory Visit: Payer: Self-pay

## 2019-08-27 ENCOUNTER — Ambulatory Visit: Payer: Self-pay | Admitting: *Deleted

## 2019-08-27 ENCOUNTER — Telehealth: Payer: Self-pay | Admitting: Cardiology

## 2019-08-27 VITALS — BP 116/68 | HR 40

## 2019-08-27 DIAGNOSIS — I493 Ventricular premature depolarization: Secondary | ICD-10-CM

## 2019-08-27 DIAGNOSIS — Z0130 Encounter for examination of blood pressure without abnormal findings: Secondary | ICD-10-CM

## 2019-08-27 NOTE — Telephone Encounter (Signed)
New message:    Calling to report BP running low HR between 35-42 that is resting manual check. Notes also in Epic.

## 2019-08-27 NOTE — Telephone Encounter (Signed)
Returned call to patient. He states that he called the on call provider last night for low BP and pulse. He was instructed to hold all antihypertensives and only take hydralazine if SBP >140. Patient states that he has not had any of his medicines this AM. He states that he has felt tired and fatigued the past several days. He states that the nurse at his work checked his BP and HR this morning and it was 116/70 and HR 42 bpm. He states that his HR is now 61 bpm. Denies lightheadedness, dizziness, or syncope. Patient has a Hx of PVCs with recent monitor on file. Instructed patient to continue to hold antihypertensive meds and only take hydralazine for SBP >140. ER precautions reviewed with the patient. Will forward to team for review and additional recommendation.

## 2019-08-27 NOTE — Telephone Encounter (Signed)
Please have him come in for EKG to make sure he does not have any heart block - if availaibility see if he can see an extender

## 2019-08-27 NOTE — Telephone Encounter (Signed)
I spoke to the patient about Dr Theodosia Blender recommendations.  He is willing to come in on Friday @ 2 for EKG.  He does not want to make appt with APP due to heavy work load, but I expressed importance.  There are availabilities next week, but he would rather do EKG first and determine from there.

## 2019-08-27 NOTE — Progress Notes (Signed)
Pt in to clinic reporting low BP and HR since last night. Called [facility] on call line and was told to skip BP regimen meds last night and call office this morning with vitals prior to taking meds. Manual BP 116/68 today. HR manually 35-42, irregular, at rest. Pt reports dizziness from last night has resolved. Still feels tired and run down. Also reports a mental fogginess, forgetfulness over past 2 days, eg leaving wallet at home, then leaving it at work.  [facility] office called with this mornings' vitals from clinic per on call PA notes from last night. Pts BP on home monitor at 0730 was 106/56 with "error" on HR, prior to that HR was 35 per home BP machine. Awaiting return call or message.

## 2019-08-28 ENCOUNTER — Ambulatory Visit (INDEPENDENT_AMBULATORY_CARE_PROVIDER_SITE_OTHER): Payer: PRIVATE HEALTH INSURANCE

## 2019-08-28 ENCOUNTER — Telehealth: Payer: Self-pay | Admitting: *Deleted

## 2019-08-28 VITALS — BP 116/78 | HR 71 | Wt 242.0 lb

## 2019-08-28 DIAGNOSIS — I498 Other specified cardiac arrhythmias: Secondary | ICD-10-CM

## 2019-08-28 DIAGNOSIS — I499 Cardiac arrhythmia, unspecified: Secondary | ICD-10-CM | POA: Diagnosis not present

## 2019-08-28 DIAGNOSIS — I10 Essential (primary) hypertension: Secondary | ICD-10-CM

## 2019-08-28 NOTE — Telephone Encounter (Signed)
Called and spoke with pt as f/u for low BP and HR yesterday. He has EKG scheduled at 1400 today at cardiology office. He reports BP this morning at home was 136/62. He held his antihypertensives again as he was instructed to take only hydralazine and only is SBP was >140. Pulse was 62 on monitor this morning. He reports continued fatigue, dizziness which was the worst last night, and mental fog. 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  ER precautions reviewed with pt for over the weekend and encouraged pt to follow recommendations of cardiology office re: ekg results today. Pt verbalizes understanding and agreement with plan of care. No further questions.

## 2019-08-28 NOTE — Progress Notes (Signed)
Pt presents today for a follow up EKG after reporting a low pulse and BP. Pt has c/o DOE, fatigue, daytime somnolence and generalized weakness. Pts BP and HR are WNL today.   EKG was performed and evaluated by DOD, Dr. Rayann Heman. Determined to be bigeminy PVC's, not heart block. Pt ok to go home.   I will forward to Dr. [name] to decide on follow up. EKG to be scanned in.

## 2019-09-01 ENCOUNTER — Other Ambulatory Visit: Payer: Self-pay

## 2019-09-01 ENCOUNTER — Encounter: Payer: Self-pay | Admitting: Registered Nurse

## 2019-09-01 ENCOUNTER — Ambulatory Visit: Payer: Self-pay | Admitting: Registered Nurse

## 2019-09-01 ENCOUNTER — Other Ambulatory Visit: Payer: Self-pay | Admitting: Registered Nurse

## 2019-09-01 VITALS — BP 149/90 | HR 59 | Temp 97.1°F

## 2019-09-01 DIAGNOSIS — R002 Palpitations: Secondary | ICD-10-CM

## 2019-09-01 DIAGNOSIS — I10 Essential (primary) hypertension: Secondary | ICD-10-CM

## 2019-09-01 NOTE — Progress Notes (Signed)
Subjective:    Patient ID: Ryan Glass, male    DOB: 1956/11/01, 63 y.o.   MRN: [id number]  63y/o Hispanic married male established patient; palpitation frequency decreased this morning.  Feeling better today after holding his bystolic last week per cardiology.  Still noticing occasional irregular beats.  SOB/dyspnea and mental fog resolved today and had improved over the course of last week.  Patient had experienced bradycardia and BP lower than his normal which tended to run closer to todays readings most days over the last 3 years prior to his cardiac ablation 2019.  Cardiology adjusted his hypertension medications--bystolic 5 to 10 and hydralazine 10-25 mg doubled Mar 2020. He had cut down on his pain medication diclofenac and tylenol #3 only prn not daily due to Cr increasing/GFR decreasing in August 2020.  Renal function was normal Mar 2020 and Rheumatology had started him on plaquenil/tylenol#12 Apr 2019. He reports he has been taking potassium every day as prescribed TID even though has been on altered BP/diuretic medication schedule x 1 week due to hypotension/bradycardia/palpitations.  Hypokalemia with palpitations worsened 07/23/2019 and resolved with added 81meq at lunch potassium chloride.  Patient due repeat nonfasting BMET scheduled for 09/03/2019 with RN [name] to recheck K and renal function. Per patient this morning BP reading at home was 159/89. Took Hydralazine 25mg  and Potassium chloride 40mg  this morning. Took Bystolic 10mg  last night. In the past week, has only taken Hydralazine twice if SBP >140 per cardiology instructions.  Patient reported has been drinking 3 large cups of water (water bottle) per day "his usual"  Dinner last night had a lot of salt.  Denied gout flare up/ leg swelling this am/headache/visual changes/weakness/tremors/nausea/vomiting or diarrhea.  Pain okay despite cold weather.     Review of Systems  Constitutional: Negative for activity change, appetite change,  chills, diaphoresis, fatigue and fever.  HENT: Negative for trouble swallowing and voice change.   Eyes: Negative for photophobia and visual disturbance.  Respiratory: Positive for shortness of breath. Negative for cough, choking, chest tightness, wheezing and stridor.   Cardiovascular: Positive for palpitations. Negative for chest pain.  Gastrointestinal: Negative for abdominal pain, constipation, diarrhea, nausea and vomiting.  Endocrine: Negative for cold intolerance and heat intolerance.  Genitourinary: Negative for difficulty urinating.  Musculoskeletal: Positive for arthralgias. Negative for gait problem, joint swelling, neck pain and neck stiffness.  Skin: Negative for color change, pallor, rash and wound.  Allergic/Immunologic: Positive for environmental allergies. Negative for food allergies.  Neurological: Negative for dizziness, tremors, seizures, syncope, facial asymmetry, speech difficulty, weakness, light-headedness, numbness and headaches.  Hematological: Negative for adenopathy. Does not bruise/bleed easily.  Psychiatric/Behavioral: Negative for agitation, confusion and sleep disturbance.       Objective:   Physical Exam Vitals signs and nursing note reviewed.  Constitutional:      General: He is awake. He is not in acute distress.    Appearance: Normal appearance. He is well-developed and well-groomed. He is obese. He is not ill-appearing, toxic-appearing or diaphoretic.  HENT:     Head: Normocephalic and atraumatic.     Jaw: There is normal jaw occlusion.     Salivary Glands: Right salivary gland is not diffusely enlarged or tender. Left salivary gland is not diffusely enlarged or tender.     Right Ear: Hearing and external ear normal.     Left Ear: Hearing and external ear normal.     Nose: Nose normal.     Mouth/Throat:     Lips: Pink.  No lesions.     Mouth: Mucous membranes are moist.     Tongue: No lesions.     Pharynx: Oropharynx is clear. Uvula midline.   Eyes:     General: Lids are normal. Vision grossly intact. Gaze aligned appropriately. Allergic shiner present. No visual field deficit or scleral icterus.       Right eye: No discharge.        Left eye: No discharge.     Extraocular Movements: Extraocular movements intact.     Right eye: Normal extraocular motion and no nystagmus.     Left eye: Normal extraocular motion and no nystagmus.     Conjunctiva/sclera: Conjunctivae normal.     Pupils: Pupils are equal, round, and reactive to light.  Neck:     Musculoskeletal: Normal range of motion and neck supple. No edema, erythema, neck rigidity, crepitus, injury or torticollis.     Thyroid: No thyromegaly.     Trachea: Trachea normal.  Cardiovascular:     Rate and Rhythm: Normal rate. Rhythm irregularly irregular. Occasional extrasystoles are present.    Pulses: Normal pulses.          Radial pulses are 2+ on the right side and 2+ on the left side.     Heart sounds: Normal heart sounds.  Pulmonary:     Effort: Pulmonary effort is normal.     Breath sounds: Normal breath sounds and air entry. No stridor, decreased air movement or transmitted upper airway sounds. No decreased breath sounds, wheezing, rhonchi or rales.     Comments: Repeat spo2 room air 98% while sitting and not talking; wearing cloth mask due to covid 19 pandemic; spoke full sentences without difficulty; no cough or throat clearing noted during exam Abdominal:     Palpations: Abdomen is soft.  Musculoskeletal: Normal range of motion.        General: No swelling, tenderness, deformity or signs of injury.     Right shoulder: Normal.     Left shoulder: Normal.     Right elbow: Normal.    Left elbow: Normal.     Right hip: Normal.     Left hip: Normal.     Right knee: Normal.     Left knee: Normal.     Cervical back: Normal.     Thoracic back: Normal.     Lumbar back: Normal.     Right hand: Normal.     Left hand: Normal.     Right lower leg: No edema.     Left lower  leg: No edema.  Lymphadenopathy:     Head:     Right side of head: No submental, submandibular, tonsillar, preauricular, posterior auricular or occipital adenopathy.     Left side of head: No submental, submandibular, tonsillar, preauricular, posterior auricular or occipital adenopathy.     Cervical: No cervical adenopathy.     Right cervical: No superficial cervical adenopathy.    Left cervical: No superficial cervical adenopathy.  Skin:    General: Skin is warm and dry.     Capillary Refill: Capillary refill takes less than 2 seconds.     Coloration: Skin is not ashen, cyanotic, jaundiced, mottled, pale or sallow.     Findings: No abrasion, abscess, acne, bruising, burn, ecchymosis, erythema, signs of injury, laceration, lesion, petechiae, rash or wound.     Nails: There is no clubbing.   Neurological:     General: No focal deficit present.     Mental Status: He is alert  and oriented to person, place, and time. Mental status is at baseline.     GCS: GCS eye subscore is 4. GCS verbal subscore is 5. GCS motor subscore is 6.     Cranial Nerves: Cranial nerves are intact. No cranial nerve deficit, dysarthria or facial asymmetry.     Sensory: Sensation is intact. No sensory deficit.     Motor: Motor function is intact. No weakness, tremor, atrophy, abnormal muscle tone or seizure activity.     Coordination: Coordination is intact.     Gait: Gait is intact. Gait normal.     Comments: In/out of chair without difficulty gait sure and steady in hallway; standing for long periods at workcenter also observed today; bilateral hand grasp 5/5 equal and upper/lower extremity strength equal 5/5  Psychiatric:        Attention and Perception: Attention and perception normal.        Mood and Affect: Mood and affect normal.        Speech: Speech normal.        Behavior: Behavior normal. Behavior is cooperative.        Thought Content: Thought content normal.        Cognition and Memory: Cognition and  memory normal.        Judgment: Judgment normal.      Irregular heartrate; skipped beat averaged every 10 seconds in clinic today Reviewed care everywhere cardiology/rheumatology/PCM notes 2020 and EKG/lab results 2020.     Assessment & Plan:  A-Hypertension, palpitations  P-BMET nonfasting today instead of waiting until Thursday (48 hours).  Discussed with patient to hold the rest of his potassium doses for today.  Discussed with patient high or low potassium can affect his blood pressure/palpitations/weakness/muscle tremors also.   Avoid increased water intake maintain his usual.  Will call him in the morning with his lab results.  Take his blood pressure/diuretic medications per cardiology instructions.  ER precautions given in regards to palpitations if syncope/confusion/worsening/chest pain seek re-evaluation.  VSS in clinic and  symptoms improved but not resolved.  Patient pending follow up with cardiologist/specialist review of his EKG from last week saw NP and determined not in heart block released to home  Exitcare handout palpitations, hyperkalemia and hypokalemia printed and given to patient.  Discussed with patient potassium supplement needs may vary depending on kidney function, diet, water intake and medications especially if holding diuretic.  To ensure if a provider asks him to hold lasix/chlorthalidone he verify what he should do with his potassium.  Reiterated to notify cardiology if experiencing worsening palpitations or changes in his usual blood pressure/pulse (higher or lower than normal and if he is experiencing symptoms e.g. low --mental fog, dyspnea/SOB,  dizzyness, visual changes, nausea/fatigue/vomiting or high (headache,chest pain, visual changes, dyspnea/SOB).  Patient verbalized understanding information/instructions, agreed with plan of care and had no further questions at this time.

## 2019-09-01 NOTE — Telephone Encounter (Signed)
Reviewed cardiology note bigeminy PVCs noted on Friday EKG in their office.  HR and BP normal.  NP clinic today [id number] will check with patient at workcenter for symptoms today if continuing will check potassium level/BMET nonfasting and reinforce ER precautions with him.

## 2019-09-01 NOTE — Patient Instructions (Addendum)
Palpitations Palpitations are feelings that your heartbeat is not normal. Your heartbeat may feel like it is:  Uneven.  Faster than normal.  Fluttering.  Skipping a beat. This is usually not a serious problem. In some cases, you may need tests to rule out any serious problems. Follow these instructions at home: Pay attention to any changes in your condition. Take these actions to help manage your symptoms: Eating and drinking  Avoid: ? Coffee, tea, soft drinks, and energy drinks. ? Chocolate. ? Alcohol. ? Diet pills. Lifestyle   Try to lower your stress. These things can help you relax: ? Yoga. ? Deep breathing and meditation. ? Exercise. ? Using words and images to create positive thoughts (guided imagery). ? Using your mind to control things in your body (biofeedback).  Do not use drugs.  Get plenty of rest and sleep. Keep a regular bed time. General instructions   Take over-the-counter and prescription medicines only as told by your doctor.  Do not use any products that contain nicotine or tobacco, such as cigarettes and e-cigarettes. If you need help quitting, ask your doctor.  Keep all follow-up visits as told by your doctor. This is important. You may need more tests if palpitations do not go away or get worse. Contact a doctor if:  Your symptoms last more than 24 hours.  Your symptoms occur more often. Get help right away if you:  Have chest pain.  Feel short of breath.  Have a very bad headache.  Feel dizzy.  Pass out (faint). Summary  Palpitations are feelings that your heartbeat is uneven or faster than normal. It may feel like your heart is fluttering or skipping a beat.  Avoid food and drinks that may cause palpitations. These include caffeine, chocolate, and alcohol.  Try to lower your stress. Do not smoke or use drugs.  Get help right away if you faint or have chest pain, shortness of breath, a severe headache, or dizziness. This  information is not intended to replace advice given to you by your health care provider. Make sure you discuss any questions you have with your health care provider. Document Released: 09/04/2008 Document Revised: 01/08/2018 Document Reviewed: 01/08/2018 Elsevier Patient Education  2020 [employer]. Hyperkalemia Hyperkalemia is when you have too much potassium in your blood. Potassium helps your body in many ways, but having too much can cause problems. If there is too much potassium in your blood, it can affect how your heart works. Potassium is normally removed from your body by your kidneys. Many things can cause the amount in your blood to be high. Medicines and other treatments can be used to bring the amount to a normal level. Treatment may need to be done in the hospital. Follow these instructions at home:   Take over-the-counter and prescription medicines only as told by your doctor.  Do not take any of the following unless your doctor says it is okay: ? Supplements. ? Natural products. ? Herbs. ? Vitamins.  Limit how much alcohol you drink as told by your doctor.  Do not use drugs. If you need help quitting, ask your doctor.  If you have kidney disease, you may need to follow a low-potassium diet. A food specialist (dietitian) can help you.  Keep all follow-up visits as told by your doctor. This is important. Contact a doctor if:  Your heartbeat is not regular or is very slow.  You feel dizzy (light-headed).  You feel weak.  You feel sick to  your stomach (nauseous).  You have tingling in your hands or feet.  You lose feeling (have numbness) in your hands or feet. Get help right away if:  You are short of breath.  You have chest pain.  You pass out (faint).  You cannot move your muscles. Summary  Hyperkalemia is when you have too much potassium in your blood.  Take over-the-counter and prescription medicines only as told by your doctor.  Limit how much  alcohol you drink as told by your doctor.  Contact a doctor if your heartbeat is not regular. This information is not intended to replace advice given to you by your health care provider. Make sure you discuss any questions you have with your health care provider. Document Released: 11/26/2005 Document Revised: 11/11/2017 Document Reviewed: 11/11/2017 Elsevier Patient Education  2020 [employer].  Hypokalemia Hypokalemia means that the amount of potassium in the blood is lower than normal. Potassium is a chemical (electrolyte) that helps regulate the amount of fluid in the body. It also stimulates muscle tightening (contraction) and helps nerves work properly. Normally, most of the body's potassium is inside cells, and only a very small amount is in the blood. Because the amount in the blood is so small, minor changes to potassium levels in the blood can be life-threatening. What are the causes? This condition may be caused by:  Antibiotic medicine.  Diarrhea or vomiting. Taking too much of a medicine that helps you have a bowel movement (laxative) can cause diarrhea and lead to hypokalemia.  Chronic kidney disease (CKD).  Medicines that help the body get rid of excess fluid (diuretics).  Eating disorders, such as bulimia.  Low magnesium levels in the body.  Sweating a lot. What are the signs or symptoms? Symptoms of this condition include:  Weakness.  Constipation.  Fatigue.  Muscle cramps.  Mental confusion.  Skipped heartbeats or irregular heartbeat (palpitations).  Tingling or numbness. How is this diagnosed? This condition is diagnosed with a blood test. How is this treated? This condition may be treated by:  Taking potassium supplements by mouth.  Adjusting the medicines that you take.  Eating more foods that contain a lot of potassium. If your potassium level is very low, you may need to get potassium through an IV and be monitored in the hospital. Follow  these instructions at home:   Take over-the-counter and prescription medicines only as told by your health care provider. This includes vitamins and supplements.  Eat a healthy diet. A healthy diet includes fresh fruits and vegetables, whole grains, healthy fats, and lean proteins.  If instructed, eat more foods that contain a lot of potassium. This includes: ? Nuts, such as peanuts and pistachios. ? Seeds, such as sunflower seeds and pumpkin seeds. ? Peas, lentils, and lima beans. ? Whole grain and bran cereals and breads. ? Fresh fruits and vegetables, such as apricots, avocado, bananas, cantaloupe, kiwi, oranges, tomatoes, asparagus, and potatoes. ? Orange juice. ? Tomato juice. ? Red meats. ? Yogurt.  Keep all follow-up visits as told by your health care provider. This is important. Contact a health care provider if you:  Have weakness that gets worse.  Feel your heart pounding or racing.  Vomit.  Have diarrhea.  Have diabetes (diabetes mellitus) and you have trouble keeping your blood sugar (glucose) in your target range. Get help right away if you:  Have chest pain.  Have shortness of breath.  Have vomiting or diarrhea that lasts for more than 2 days.  Faint. Summary  Hypokalemia means that the amount of potassium in the blood is lower than normal.  This condition is diagnosed with a blood test.  Hypokalemia may be treated by taking potassium supplements, adjusting the medicines that you take, or eating more foods that are high in potassium.  If your potassium level is very low, you may need to get potassium through an IV and be monitored in the hospital. This information is not intended to replace advice given to you by your health care provider. Make sure you discuss any questions you have with your health care provider. Document Released: 11/26/2005 Document Revised: 07/09/2018 Document Reviewed: 07/09/2018 Elsevier Patient Education  2020 [employer].

## 2019-09-02 LAB — BASIC METABOLIC PANEL
BUN/Creatinine Ratio: 17 (ref 10–24)
BUN: 21 mg/dL (ref 8–27)
CO2: 20 mmol/L (ref 20–29)
Calcium: 9.1 mg/dL (ref 8.6–10.2)
Chloride: 106 mmol/L (ref 96–106)
Creatinine, Ser: 1.22 mg/dL (ref 0.76–1.27)
GFR calc Af Amer: 72 mL/min/{1.73_m2} (ref >59)
GFR calc non Af Amer: 63 mL/min/{1.73_m2} (ref >59)
Glucose: 110 mg/dL — ABNORMAL HIGH (ref 65–99)
Potassium: 3.6 mmol/L (ref 3.5–5.2)
Sodium: 141 mmol/L (ref 134–144)

## 2019-09-03 NOTE — Progress Notes (Signed)
Good morning. Please review previous notes from the past week regarding Ryan Glass's BP readings in [name], [facility]iny EKG on 08/28/19, and recent labs. Pt was instructed to hold all BP meds except Hydralazine if SBP >140. He has taken this for the past 2 days and now has a new onset HA. He is wondering if he needs to restart his other BP meds. Per chart review, after EKG on Friday, we were waiting for Dr. [name] to advise on f/u. Due to fluctuating BPs and pulse, please advise recommendations/appt needs. Thank you.

## 2019-09-03 NOTE — Progress Notes (Signed)
Noted awaiting further guidance from cardiology for patient

## 2019-09-08 ENCOUNTER — Other Ambulatory Visit: Payer: Self-pay | Admitting: Cardiology

## 2019-09-08 NOTE — Progress Notes (Signed)
Pt now scheduled to see Melina Copa, NP on 10/13/2019 for cardiology f/u.

## 2019-09-08 NOTE — Progress Notes (Signed)
Noted appt with cardiology pending

## 2019-09-09 ENCOUNTER — Telehealth: Payer: Self-pay | Admitting: Cardiology

## 2019-09-09 NOTE — Telephone Encounter (Signed)
Left a message for the patient to call back.  

## 2019-09-09 NOTE — Telephone Encounter (Signed)
Patient called stating someone called him yesterday about an appt.

## 2019-09-10 ENCOUNTER — Encounter: Payer: Self-pay | Admitting: Cardiology

## 2019-09-10 ENCOUNTER — Telehealth: Payer: Self-pay

## 2019-09-10 ENCOUNTER — Telehealth: Payer: Self-pay | Admitting: *Deleted

## 2019-09-10 NOTE — Telephone Encounter (Signed)
   TELEPHONE CALL NOTE  This patient has been deemed a candidate for follow-up tele-health visit to limit community exposure during the Covid-19 pandemic. I spoke with the patient via phone to discuss instructions.  A Virtual Office Visit appointment type has been scheduled for 09/11/19 with Dr [name], with "VIDEO" or "TELEPHONE" in the appointment notes - patient prefers Telephone type.   Frederik Schmidt, RN 09/10/2019 12:10 PM   Patient has consented to Virtual Visit by phone with Dr [name] on 10/2 @ 2 pm.

## 2019-09-10 NOTE — Telephone Encounter (Signed)
Patient wanting further advice regarding his medications.  Stated his blood pressure has been reading high for the past week and worsening.  He showed me work BP log 9/29 at 1050 189/107 pulse 52.  9/25 0727 154/95 pulse 54; 1043 149/96 pulse 54 1345 158/98 53; 09/24 @0740  167/101 pulse 54; 100 146/103 pulse 50.  He reports having a headache at some point every day.  Has been taking hydralazine 25mg  po TID prn BP>140/90 for the past week after checking his BP.  Patient also reported he took furosemide 40mg  po this am and stopped his plaquenil 200mg  two days ago because he didn't think it was helping.  He hasn't noticed any skipped beats recently.  Checked manual radial pulse at his desk and 1 irregular beat in 60 seconds HR 58.  Patient is keeping a blood pressure machine at work and one at home.  [facility] will be closed starting tomorrow and reopening 6 Oct at 0830.  Discussed with patient to upload his BP log at home to [facility] when he gets home tonight and follow up with cardiology nurse to reschedule his appt as currently not to be seen until Nov.  Patient notified plaquenil half life 32-50 days per [facility] so he may not notice a difference in how he is feeling for 1-2 months after stopping medication.  Discussed with patient he should notify ordering provider and discuss stopping any medications with pharmacist or provider prior to discontinuing medication as some may require tapering instead of stopping cold [place].  Patient verbalized understanding information/instructions, agreed with plan of care and had no further questions at this time.

## 2019-09-10 NOTE — Telephone Encounter (Signed)
I spoke to the patient and found him an appointment with Dr [name] on 10/2 @ 2:00 pm to f/u on BP medication.  He verbalized understanding.

## 2019-09-10 NOTE — Telephone Encounter (Signed)
I spoke to the patient who is calling because he needs a sooner appointment to discuss medications than 11/3.  Please see if we can get him a virtual with one of Dr Theodosia Blender APPs next week..  Per Dr [name].  Thank you

## 2019-09-10 NOTE — Telephone Encounter (Signed)

## 2019-09-11 ENCOUNTER — Other Ambulatory Visit: Payer: Self-pay

## 2019-09-11 ENCOUNTER — Telehealth (INDEPENDENT_AMBULATORY_CARE_PROVIDER_SITE_OTHER): Payer: PRIVATE HEALTH INSURANCE | Admitting: Cardiology

## 2019-09-11 ENCOUNTER — Encounter: Payer: Self-pay | Admitting: Cardiology

## 2019-09-11 ENCOUNTER — Encounter: Payer: Self-pay | Admitting: *Deleted

## 2019-09-11 VITALS — Ht 70.0 in | Wt 238.0 lb

## 2019-09-11 DIAGNOSIS — E785 Hyperlipidemia, unspecified: Secondary | ICD-10-CM

## 2019-09-11 DIAGNOSIS — I712 Thoracic aortic aneurysm, without rupture, unspecified: Secondary | ICD-10-CM

## 2019-09-11 DIAGNOSIS — I10 Essential (primary) hypertension: Secondary | ICD-10-CM

## 2019-09-11 DIAGNOSIS — I498 Other specified cardiac arrhythmias: Secondary | ICD-10-CM

## 2019-09-11 DIAGNOSIS — G4733 Obstructive sleep apnea (adult) (pediatric): Secondary | ICD-10-CM

## 2019-09-11 MED ORDER — NEBIVOLOL HCL 5 MG PO TABS: 5.0000 mg | ORAL_TABLET | Freq: Every day | ORAL | 4 refills | Status: DC

## 2019-09-11 NOTE — Patient Instructions (Addendum)
Medication Instructions:   PLEASE RESTART BACK TAKING BYSTOLIC 5 MG BY MOUTH DAILY   If you need a refill on your cardiac medications before your next appointment, please call your pharmacy.    Lab work:  ON September 24, 2019 AT 10 AM, SAME DAY AS YOU SEE DAYNA DUNN PA-C--TO CHECK LIPIDS, BMET, AND ALT--PLEASE COME FASTING TO HIS LAB APPOINTMENT  If you have labs (blood work) drawn today and your tests are completely normal, you will receive your results only by: Marland Kitchen MyChart Message (if you have MyChart) OR . A paper copy in the mail If you have any lab test that is abnormal or we need to change your treatment, we will call you to review the results.   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   Follow-Up:  WITH DAYNA DUNN PA-C ON September 24, 2019 AT 10:15 AM AS A REGULAR OFFICE VISIT-YOU WILL HAVE AN EKG DONE AT THIS VISIT--YOU WILL HAVE LAB WORK DONE SAME DAY AT 10 AM  Your physician wants you to follow-up in: 3 MONTHS WITH DR [name] You will receive a reminder letter in the mail two months in advance. If you don't receive a letter, please call our office to schedule the follow-up appointment.

## 2019-09-11 NOTE — Progress Notes (Signed)
Virtual Visit via Video Note   This visit type was conducted due to national recommendations for restrictions regarding the COVID-19 Pandemic (e.g. social distancing) in an effort to limit this patient's exposure and mitigate transmission in our community.  Due to his co-morbid illnesses, this patient is at least at moderate risk for complications without adequate follow up.  This format is felt to be most appropriate for this patient at this time.  All issues noted in this document were discussed and addressed.  A limited physical exam was performed with this format.  Please refer to the patient's chart for his consent to telehealth for [facility].   Evaluation Performed:  Follow-up visit  This visit type was conducted due to national recommendations for restrictions regarding the COVID-19 Pandemic (e.g. social distancing).  This format is felt to be most appropriate for this patient at this time.  All issues noted in this document were discussed and addressed.  No physical exam was performed (except for noted visual exam findings with Video Visits).  Please refer to the patient's chart (MyChart message for video visits and phone note for telephone visits) for the patient's consent to telehealth for [facility].  Date:  09/11/2019   ID:  Ryan Glass, DOB [birth year], MRN [id number]  Patient Location:  Home  Provider location:   [place]  PCP:  [name], MD  Cardiologist:  [name], MD Electrophysiologist:  None   Chief Complaint:  PVCs  History of Present Illness:    Ryan Glass is a 63 y.o. male who presents via audio/video conferencing for a telehealth visit today.    Ryan Glass is a 63 y.o. 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 Ladona Ridgel, dilated aortic root, hypertension, lower extremity edema and chest pain.  At his last office visit almost a year ago he was complaining of increased palpitations.  Event monitor showed frequent PVCs and  24-hour Holter showed 22% PVC load. Was referred to EP was felt that the PVCs were originating to the lateral wall of the inferior apex of the RV.  He underwent successful PVC ablation in November 2019 and was doing well at follow-up visit in November.  The patient went to get off his beta-blocker and this was weaned off.  He is here today for followup and is doing well.  He says about a week ago his BP dropped and noticed that his HR was slow.  He felt fatigued with DOE and weakness.  He stopped his BP meds.  He says it resolved and now his BP is trending upward. Had EKG done in office showing NSR with bigeminal PVCs.  He says that recently his BP has been running 174/60mmHg this am and last night 174/17mHg with HR 60bpm.  He denies any chest pain or pressure, SOB, DOE, PND, orthopnea, LE edema, dizziness, palpitations or syncope. He is compliant with He meds and is tolerating meds with no SE.    The patient does not have symptoms concerning for COVID-19 infection (fever, chills, cough, or new shortness of breath).    Prior CV studies:   The following studies were reviewed today:  none  Past Medical History:  Diagnosis Date  . Hyperlipidemia LDL goal <70   . Hypertension   . PVC's (premature ventricular contractions)    Status post PVC ablation by Dr. Ladona Ridgel 2019  . Thoracic aortic aneurysm (HCC)    46 mm aortic root and 39 mm ascending  aorta by echo 2019.  Chest CT 05/2018 showed 4.3 cm thoracic aorta.  . Umbilical hernia    Past Surgical History:  Procedure Laterality Date  . PVC ABLATION N/A 10/07/2018   Procedure: PVC ABLATION;  Surgeon: Marinus Maw, MD;  [facility];  Service: Cardiovascular;  Laterality: N/A;     Current Meds  Medication Sig  . acetaminophen-codeine (TYLENOL #3) 300-30 MG tablet   . atorvastatin (LIPITOR) 20 MG tablet TAKE ONE TABLET BY MOUTH Ryan  . colchicine 0.6 MG tablet Take 1-2 tablets by mouth Ryan as needed (gout).   Marland Kitchen diclofenac  (CATAFLAM) 50 MG tablet   . fluticasone (FLONASE) 50 MCG/ACT nasal spray INSTILL 1 SPRAY INTO BOTH NOSTRILS TWO TIMES A DAY  . gabapentin (NEURONTIN) 600 MG tablet Take 600 mg by mouth 3 (three) times Ryan.   . hydrALAZINE (APRESOLINE) 25 MG tablet Take 1 tablet (25 mg total) by mouth 3 (three) times Ryan.  . hydroxychloroquine (PLAQUENIL) 200 MG tablet Take by mouth.  . montelukast (SINGULAIR) 10 MG tablet TAKE ONE TABLET BY MOUTH EVERY NIGHT AT BEDTIME  . potassium chloride SA (KLOR-CON) 20 MEQ tablet Take 20 mEq by mouth 2 (two) times Ryan. 2 tablets BID  . [DISCONTINUED] amLODipine (NORVASC) 5 MG tablet Take 1 tablet by mouth Ryan.  . [DISCONTINUED] furosemide (LASIX) 40 MG tablet Take 1 tablet (40 mg total) by mouth Ryan.  . [DISCONTINUED] losartan (COZAAR) 100 MG tablet Take 1 tablet (100 mg total) by mouth Ryan.  . [DISCONTINUED] nebivolol (BYSTOLIC) 10 MG tablet Take 1 tablet (10 mg total) by mouth Ryan.  . [DISCONTINUED] Potassium Chloride ER 20 MEQ TBCR Take 40 mg by mouth 2 (two) times Ryan.     Allergies:   Amlodipine besylate and Aspirin   Social History   Tobacco Use  . Smoking status: Never Smoker  . Smokeless tobacco: Never Used  Substance Use Topics  . Alcohol use: Yes  . Drug use: Never     Family Hx: The patient's family history includes Cardiomyopathy in his mother; Heart attack in his father.  ROS:   Please see the history of present illness.     All other systems reviewed and are negative.   Labs/Other Tests and Data Reviewed:    Recent Labs: 09/25/2018: Hemoglobin 13.9; Platelets 179 02/26/2019: TSH 2.260 07/23/2019: ALT 29; Magnesium 2.0 09/01/2019: BUN 21; Creatinine, Ser 1.22; Potassium 3.6; Sodium 141   Recent Lipid Panel Lab Results  Component Value Date/Time   CHOL 124 09/18/2018 08:50 AM   TRIG 126 09/18/2018 08:50 AM   HDL 38 (L) 09/18/2018 08:50 AM   CHOLHDL 3.3 09/18/2018 08:50 AM   LDLCALC 61 09/18/2018 08:50 AM    Wt  Readings from Last 3 Encounters:  09/11/19 238 lb (108 kg)  08/28/19 242 lb (109.8 kg)  08/13/19 241 lb (109.3 kg)     Objective:    Vital Signs:  Ht 5\' 10"  (1.778 m)   Wt 238 lb (108 kg)   BMI 34.15 kg/m     ASSESSMENT & PLAN:    1.  PVCs -he is s/p PVC ablation but now PVCs are back -PVCs are from different focus than pre ablation -Holter monitor showed PVC load at 8% -EP recommended staying on BB -restart Bystolic 5mg  Ryan since BP elevated.  I do not think his HR was really 35bpm, he has bigeminal PVCs and likely not counted in pulse.   -Followup with PA in 1 week for  in office visit  2.  HTN -BP has trended upward since stopping all BP meds.  -Currently taking Hydralazine 25mg  TID -will add Bystolic 5mg  Ryan back in and have him check his BP Ryan for a week and call with results.  -check BMET in 1 week  3.  Ascending aortic aneursym -4.3cm by Chest CT a year ago -repeat chest CTA to assess for stability  4.  HLD -LDL goal < 70 due to vascular disease -repeat FLP and ALT at time of OV with PA in 1 week -continue atorvastatin 20mg  Ryan  COVID-19 Education: The signs and symptoms of COVID-19 were discussed with the patient and how to seek care for testing (follow up with PCP or arrange E-visit).  The importance of social distancing was discussed today.  Patient Risk:   After full review of this patient's clinical status, I feel that they are at least moderate risk at this time.  Time:   Today, I have spent 20 minutes directly with the patient on telemedicine discussing medical problems including HTN, HDL, PVCs and aortic aneursym.  We also reviewed the symptoms of COVID 19 and the ways to protect against contracting the virus with telehealth technology.  I spent an additional 5 minutes reviewing patient's chart including labs.  Medication Adjustments/Labs and Tests Ordered: Current medicines are reviewed at length with the patient today.  Concerns regarding  medicines are outlined above.  Tests Ordered: No orders of the defined types were placed in this encounter.  Medication Changes: No orders of the defined types were placed in this encounter.   Disposition:  Follow up in 1 week(s) with Melina Copa, PA  Signed, [name], MD  09/11/2019 2:18 PM    [place]

## 2019-09-17 ENCOUNTER — Telehealth: Payer: Self-pay | Admitting: Registered Nurse

## 2019-09-17 DIAGNOSIS — I10 Essential (primary) hypertension: Secondary | ICD-10-CM

## 2019-09-17 NOTE — Telephone Encounter (Signed)
Patient had appt with cardiology 11 Sep 2019.  He was to continue hydralazine 25mg  po TID prn and restart bystolic 5mg  po daily.  Attempted to follow up with patient today to see how his blood pressures have been running, if any worsening of palpitations and if headache resolved but coworker stated he is on vacation and will return Monday 12 Oct.

## 2019-09-22 ENCOUNTER — Other Ambulatory Visit: Payer: Self-pay

## 2019-09-22 ENCOUNTER — Encounter: Payer: Self-pay | Admitting: Physician Assistant

## 2019-09-22 ENCOUNTER — Ambulatory Visit: Payer: Self-pay | Admitting: *Deleted

## 2019-09-22 VITALS — BP 181/112 | HR 68

## 2019-09-22 DIAGNOSIS — Z23 Encounter for immunization: Secondary | ICD-10-CM

## 2019-09-22 DIAGNOSIS — I10 Essential (primary) hypertension: Secondary | ICD-10-CM

## 2019-09-22 DIAGNOSIS — Z0130 Encounter for examination of blood pressure without abnormal findings: Secondary | ICD-10-CM

## 2019-09-22 NOTE — Progress Notes (Signed)
BP elevated today. He reports he is taking Hydralazine TID and Bystolic daily. Denies HTN sx. Updated RN on cardiology plan based on virtual visit conducted 09/11/19. Planning f/u in 1 week, in office visit with PA with fasting lipid panel, BMP, EKG. CTA chest scheduled 10/09/19. Pt voices concern with Cardiology MD and what he felt was their lack of preparation during virtual visit, felt they were suggesting all of the treatments/interventions that they have already completed and felt like things would be missed or unnecessarily  duplicated and cost extra time and money. Explained to pt that some providers review charts ahead of time, some during the appts. Virtual visits are new for many providers and they are trying to juggle in office visits as well. Encouraged pt to discuss concerns with PA during in office visit 10/15 and reminded that clinic is available for questions that he may have regarding processes/tests. He verbalizes understanding. Will attend in office visit on Thursday and update clinic as needed.

## 2019-09-22 NOTE — Progress Notes (Signed)
Cardiology Office Note    Date:  09/24/2019   ID:  Ryan Glass, DOB Mar 19, 1956, MRN [id number]  PCP:  [name], MD  Cardiologist:  [name], MD  Electrophysiologist:  [name], MD   Chief Complaint: f/u BP  History of Present Illness:   Ryan Glass is a 63 y.o. male with history of PVCs s/p PVC ablation by Dr. 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   He was remotely followed by Dr. Lady Gary then established care with Dr. Mayford Knife in 03/2018 for episodic atypical chest pain and frequent PVCs. 2D echo 04/2018 showed EF 60-65%, grade 2 DD, moderately dilated aortic root, trileaflet AV, mildly dilated ascending aorta, moderately dilated LA, mildly dilated RV. Nuclear stress test 05/2018 was normal. 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 He was referred to who felt that the PVCs were originating to the lateral wall of the inferior apex of the RV. He underwent successful PVCablation in November 2019 and initially felt well. The patient wanted to get off his beta-blocker and atenolol was weaned off.   When seen in follow-up 01/2019 his blood pressure was elevated. He was having a lot of edema on amlodipine so was changed to [employer]. He had not been compliant with CPAP which was felt to be contributing. He was seen by DOD Dr. Tenny Craw in 07/2019 as his HR was low in the 30s on home BP cuff. He came to the office and was found to have frequent PVCs (likely not being picked up on BP monitor). Dr. Tenny Craw reviewed with Dr. Ladona Ridgel. The PVCs appeared to be coming from high in the LV, different foci than previous. 2-day event monitor showed min HR of 41 bpm, max HR of 146  bpm, and avg HR of 62, predominant rhythm NSR with first degree AVB, frequent PVCs (8.2%), bundle branch block present, 1 run SVT lasting 5 beats, rare PACs, occasional ventricular bigeminy and trigeminy. He saw Francis Dowse in follow-up with EP 08/13/19 who stated that PVCs by her review appeared to be predominantly of a single morphology though did have another; he also had SR with and without BBB. [name] reviewed with Dr. Ladona Ridgel who felt there was no indication to repeat ablation. She increased Bystolic to 10mg  daily and recommended f/u with Dr. Mayford Knife.   There is a MyChart message 09/10/19 indicating his BP and pulse rate were low so he'd spoken to the on-call provider who recommended to stop all BP medications, and only take hydralazine PRN SBP>140. (Amlodipine, Lasix, losartan and Bystolic were stopped. Of note, his antihypertensive regimen is otherwise a bit tricky to follow. He appears to previously have been on losartan-HCTZ, then chlorthalidone at one point, then losartan on its own without diuretic. At one point over the summer his creatinine got up to 1.6 but do not see that was a time when diuretic was specifically stopped.) He saw Dr. Mayford Knife back virtually 09/11/19 and his blood pressure was trending upward. Dr. Mayford Knife restarted Bystolic 5mg  daily and continued hydralazine.   Last labs 08/2019 showed Cr 1.22, K 3.6, 07/2019 AST/ALT OK, Mg 2.0, 02/2019 TSH wnl, normal catecholamines/metanephrines, abnormal renin/aldo ratio, 09/2018 LDL 61, CBC wnl. He also had a normal renal artery duplex 04/2019. On his  renin/aldo result it states "Normal urinary catecholamines but PRA elevated with normal aldosterone - see telephone note concerning referral to nephrology" but I do not see a corresponding telephone note.  He returns for follow-up overall feeling well. He has not had any further edema. He denies any CP, SOB, palpitations, syncope, or dizziness. His blood pressure has still been running high. He does not  have exact numbers but says it's been variable. He does not recall having any significant low numbers. He does not recall ever being referred to nephrology. He now reports compliance with CPAP.   Past Medical History:  Diagnosis Date  . Aortic atherosclerosis (HCC)   . CKD (chronic kidney disease), stage II   . Hyperlipidemia LDL goal <70   . Hypertension   . Lower extremity edema   . OSA (obstructive sleep apnea)   . PVC's (premature ventricular contractions)    Status post PVC ablation by Dr. Ladona Ridgel 2019 with recurrence of frequent PVCs/bigeminy  . Splenomegaly    a. mild noted by prior CT.  Marland Kitchen Thoracic aortic aneurysm (HCC)    46 mm aortic root and 39 mm ascending aorta by echo 2019.  Chest CT 05/2018 showed 4.3 cm thoracic aorta.  . Umbilical hernia     Past Surgical History:  Procedure Laterality Date  . PVC ABLATION N/A 10/07/2018   Procedure: PVC ABLATION;  Surgeon: Marinus Maw, MD;  Location: [facility] INVASIVE CV LAB;  Service: Cardiovascular;  Laterality: N/A;    Current Medications: Current Meds  Medication Sig  . acetaminophen-codeine (TYLENOL #3) 300-30 MG tablet 1-2 tablets as needed.   Marland Kitchen atorvastatin (LIPITOR) 20 MG tablet TAKE ONE TABLET BY MOUTH DAILY  . colchicine 0.6 MG tablet Take 1-2 tablets by mouth daily as needed (gout).   Marland Kitchen diclofenac (CATAFLAM) 50 MG tablet Take 50 mg by mouth daily.   . fluticasone (FLONASE) 50 MCG/ACT nasal spray INSTILL 1 SPRAY INTO BOTH NOSTRILS TWO TIMES A DAY  . gabapentin (NEURONTIN) 600 MG tablet Take 600 mg by mouth 3 (three) times daily.   . hydrALAZINE (APRESOLINE) 25 MG tablet Take 1 tablet (25 mg total) by mouth 3 (three) times daily.  . hydroxychloroquine (PLAQUENIL) 200 MG tablet Take by mouth daily.   . montelukast (SINGULAIR) 10 MG tablet TAKE ONE TABLET BY MOUTH EVERY NIGHT AT BEDTIME  . nebivolol (BYSTOLIC) 5 MG tablet Take 1 tablet (5 mg total) by mouth daily.  . potassium chloride SA (KLOR-CON) 20 MEQ tablet Take 20 mEq  by mouth 2 (two) times daily. 2 tablets BID     Allergies:   Amlodipine besylate and Aspirin   Social History   Socioeconomic History  . Marital status: Single    Spouse name: Not on file  . Number of children: Not on file  . Years of education: Not on file  . Highest education level: Not on file  Occupational History  . Not on file  Social Needs  . Financial resource strain: Not on file  . Food insecurity    Worry: Not on file    Inability: Not on file  . Transportation needs    Medical: Not on file    Non-medical: Not on file  Tobacco Use  . Smoking status: Never Smoker  . Smokeless tobacco: Never Used  Substance and Sexual Activity  . Alcohol use: Yes  . Drug use: Never  . Sexual activity: Not on file  Lifestyle  . Physical activity    Days per week: Not  on file    Minutes per session: Not on file  . Stress: Not on file  Relationships  . Social Herbalist on phone: Not on file    Gets together: Not on file    Attends religious service: Not on file    Active member of club or organization: Not on file    Attends meetings of clubs or organizations: Not on file    Relationship status: Not on file  Other Topics Concern  . Not on file  Social History Narrative  . Not on file     Family History:  The patient's family history includes Cardiomyopathy in his mother; Heart attack in his father.  ROS:   Please see the history of present illness. All other systems are reviewed and otherwise negative.    EKGs/Labs/Other Studies Reviewed:    Studies reviewed were summarized above.   EKG:  EKG is ordered today, personally reviewed, demonstrating sinus bradycardia 59bpm, 1st degree AVB, rightward axis, one PVC, QTc 432ms, otherwise no acute changes  Recent Labs: 09/25/2018: Hemoglobin 13.9; Platelets 179 02/26/2019: TSH 2.260 07/23/2019: ALT 29; Magnesium 2.0 09/01/2019: BUN 21; Creatinine, Ser 1.22; Potassium 3.6; Sodium 141  Recent Lipid Panel     Component Value Date/Time   CHOL 124 09/18/2018 0850   TRIG 126 09/18/2018 0850   HDL 38 (L) 09/18/2018 0850   CHOLHDL 3.3 09/18/2018 0850   LDLCALC 61 09/18/2018 0850    PHYSICAL EXAM:    VS:  BP (!) 162/90   Pulse (!) 59   Ht 5\' 10"  (1.778 m)   Wt 246 lb 1.9 oz (111.6 kg)   SpO2 97%   BMI 35.31 kg/m   BMI: Body mass index is 35.31 kg/m.  GEN: Well nourished, well developed Hispanic M, in no acute distress HEENT: normocephalic, atraumatic Neck: no JVD, carotid bruits, or masses Cardiac: RRR; no murmurs, rubs, or gallops, no edema  Respiratory:  clear to auscultation bilaterally, normal work of breathing GI: soft, nontender, nondistended, + BS MS: no deformity or atrophy Skin: warm and dry, no rash Neuro:  Alert and Oriented x 3, Strength and sensation are intact, follows commands Psych: euthymic mood, full affect  Wt Readings from Last 3 Encounters:  09/24/19 246 lb 1.9 oz (111.6 kg)  09/11/19 238 lb (108 kg)  08/28/19 242 lb (109.8 kg)     ASSESSMENT & PLAN:   1. Essential HTN - remains uncontrolled at this time. Goal SBP closer to 120 given his TAA. Would recommend to increase hydralazine to 2 tablets (50mg ) TID for now. He says he has plenty of tablets. Will hold off sending in rx as he may require additional dose increase further to 100mg  TID. I asked him to send in his readings for review on Monday (3 hours after taking AM meds). I suspect we would want to add back some degree of losartan, perhaps at 50mg  daily. However, earlier this year he had an abnormal renin/aldo level and nephrology referral was recommended by Dr. [name]. I do not see this was placed. Will refer as previously recommended, and hold off on starting losartan pending their evaluation. Continue Bystolic. 2. Thoracic aortic aneurysm - discussed aneurysm precautions and outlined on AVS. Has f/u CT later this month. BP control will be important. 3. CKD stage II with abnormal renin/aldo level - Dr. [name]  has already ordered f/u labs for today. Refer to nephrology as above. 4. History of PVCs - only one PVC on EKG, and  no significant ectopy on exam today. Continue Bystolic. Tolerating current dose well. 5. Hyperlipidemia - [name] has already placed order for f/u lipid and ALT today.  Disposition: F/u with me in virtual OV in 2 weeks.  Medication Adjustments/Labs and Tests Ordered: Current medicines are reviewed at length with the patient today.  Concerns regarding medicines are outlined above. Medication changes, Labs and Tests ordered today are summarized above and listed in the Patient Instructions accessible in Encounters.   Signed, Laurann Montanaayna N Corabelle Spackman, PA-C  09/24/2019 10:46 AM    [facility] [address], [place], [place]  [address] Phone: [phone]) [phone]; Fax: [phone]) [phone]

## 2019-09-24 ENCOUNTER — Ambulatory Visit (INDEPENDENT_AMBULATORY_CARE_PROVIDER_SITE_OTHER): Payer: PRIVATE HEALTH INSURANCE | Admitting: Physician Assistant

## 2019-09-24 ENCOUNTER — Other Ambulatory Visit: Payer: Self-pay

## 2019-09-24 ENCOUNTER — Encounter: Payer: Self-pay | Admitting: Physician Assistant

## 2019-09-24 ENCOUNTER — Other Ambulatory Visit: Payer: PRIVATE HEALTH INSURANCE | Admitting: *Deleted

## 2019-09-24 ENCOUNTER — Encounter: Payer: Self-pay | Admitting: *Deleted

## 2019-09-24 VITALS — BP 162/90 | HR 59 | Ht 70.0 in | Wt 246.1 lb

## 2019-09-24 DIAGNOSIS — R7989 Other specified abnormal findings of blood chemistry: Secondary | ICD-10-CM

## 2019-09-24 DIAGNOSIS — I498 Other specified cardiac arrhythmias: Secondary | ICD-10-CM

## 2019-09-24 DIAGNOSIS — E785 Hyperlipidemia, unspecified: Secondary | ICD-10-CM

## 2019-09-24 DIAGNOSIS — I712 Thoracic aortic aneurysm, without rupture, unspecified: Secondary | ICD-10-CM

## 2019-09-24 DIAGNOSIS — N182 Chronic kidney disease, stage 2 (mild): Secondary | ICD-10-CM | POA: Diagnosis not present

## 2019-09-24 DIAGNOSIS — I493 Ventricular premature depolarization: Secondary | ICD-10-CM

## 2019-09-24 DIAGNOSIS — I10 Essential (primary) hypertension: Secondary | ICD-10-CM

## 2019-09-24 DIAGNOSIS — G4733 Obstructive sleep apnea (adult) (pediatric): Secondary | ICD-10-CM

## 2019-09-24 LAB — LIPID PANEL
Chol/HDL Ratio: 2.9 ratio (ref 0.0–5.0)
Cholesterol, Total: 104 mg/dL (ref 100–199)
HDL: 36 mg/dL — ABNORMAL LOW (ref >39)
LDL Chol Calc (NIH): 49 mg/dL (ref 0–99)
Triglycerides: 98 mg/dL (ref 0–149)
VLDL Cholesterol Cal: 19 mg/dL (ref 5–40)

## 2019-09-24 LAB — BASIC METABOLIC PANEL
BUN/Creatinine Ratio: 13 (ref 10–24)
BUN: 16 mg/dL (ref 8–27)
CO2: 23 mmol/L (ref 20–29)
Calcium: 9.4 mg/dL (ref 8.6–10.2)
Chloride: 104 mmol/L (ref 96–106)
Creatinine, Ser: 1.24 mg/dL (ref 0.76–1.27)
GFR calc Af Amer: 71 mL/min/{1.73_m2} (ref >59)
GFR calc non Af Amer: 61 mL/min/{1.73_m2} (ref >59)
Glucose: 82 mg/dL (ref 65–99)
Potassium: 4.2 mmol/L (ref 3.5–5.2)
Sodium: 139 mmol/L (ref 134–144)

## 2019-09-24 LAB — ALT: ALT: 21 IU/L (ref 0–44)

## 2019-09-24 MED ORDER — HYDRALAZINE HCL 25 MG PO TABS: 50.0000 mg | ORAL_TABLET | Freq: Three times a day (TID) | ORAL | 3 refills | Status: DC

## 2019-09-24 NOTE — Telephone Encounter (Signed)
Met patient at his workcenter at 1330 late entry.  He stated he met with cardiology PA this morning and had blood drawn and his blood pressure medication was adjusted again increased and he is to take his blood pressure 3 times per day 3 hours after taking his hydralazine new dose of 61m po TID.  Patient reported he will upload his blood pressure log to mychart Sunday night for next telehealth visit with cardiology on Monday.  Patient reported headache today.  Just took new dose hydralazine at 1300 and BP check due 1600 per patient.  Patient has CT scheduled for annual thoracic aneurysm check end of October and he is considering cancelling if copay too high and delaying until next year.  I discussed with patient it is important to continue monitoring aneurysm and keeping his blood pressure under control to prevent aneurysm enlargement. Will follow up with patient next week.  Discussed RN HHildred Alaminin clinic tomorrow and Monday also if he needs assistance/BP check.  Patient verbalized understanding information/instructions, agreed with plan of care and had no further questions at this time.

## 2019-09-24 NOTE — Patient Instructions (Addendum)
Medication Instructions:  Your physician has recommended you make the following change in your medication:  1.  INCREASE the Hydralazine to 2 tablets 3 times a day  *If you need a refill on your cardiac medications before your next appointment, please call your pharmacy*  Lab Work:  If you have labs (blood work) drawn today and your tests are completely normal, you will receive your results only by: Marland Kitchen MyChart Message (if you have MyChart) OR . A paper copy in the mail If you have any lab test that is abnormal or we need to change your treatment, we will call you to review the results.  Testing/Procedures:   You have been referred to [facility] to see a Nephrologist.  They will contact you with an appointment.  Follow-Up: At [facility], you and your health needs are our priority.  As part of our continuing mission to provide you with exceptional heart care, we have created designated Provider Care Teams.  These Care Teams include your primary Cardiologist (physician) and Advanced Practice Providers (APPs -  Physician Assistants and Nurse Practitioners) who all work together to provide you with the care you need, when you need it.  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 The word "aneurysm" refers to a bulge in an artery (blood vessel). Most people think of them in the context of an emergency, but yours was found incidentally. At this point there is nothing you need to do from a procedure standpoint, but there are some important things to keep in mind for day-to-day life.  Mainstays of therapy for aneurysms include very good blood pressure control, healthy lifestyle, and  avoiding tobacco products and street drugs. Research has raised concern that antibiotics in the fluoroquinolone class could be associated with increased risk of having an aneurysm develop or tear. This includes medicines that end in "floxacin," like Cipro or Levaquin. Make sure to discuss this information with other healthcare providers if you require antibiotics.  Since aneurysms can run in families, you should discuss your diagnosis with first degree relatives as they may need to be screened for this. Regular mild-moderate physical exercise is important, but avoid heavy lifting/weight lifting over 30lbs, chopping wood, shoveling snow or digging heavy earth with a shovel. It is best to avoid activities that cause grunting or straining (medically referred to as a "Valsalva maneuver"). This happens when a person bears down against a closed throat to increase the strength of arm or abdominal muscles. There's often a tendency to do this when lifting heavy weights, doing sit-ups, push-ups or chin-ups, etc., but it may be harmful.  This is a finding I would expect to be monitored periodically by your cardiology team. Most unruptured thoracic aortic aneurysms cause no symptoms, so they are often found during exams for other conditions. Contact a health care provider if you develop any discomfort in your upper back, neck, abdomen, trouble swallowing, cough or hoarseness, or unexplained weight loss. Get help right away if you develop severe pain in your upper back or abdomen that may move into your chest and arms, or any other concerning symptoms such as shortness of breath or fever.  The size at  which this typically requires intervention in in the 5.5cm range.       Virtual Visit Pre-Appointment Phone Call  "(Name), I am calling you today to discuss your upcoming appointment. We are currently trying to limit exposure to the virus that causes COVID-19 by seeing patients at home rather than in the  office."  1. "What is the BEST phone number to call the day of the visit?" - include this in appointment notes  2. "Do you have or have access to (through a family member/friend) a smartphone with video capability that we can use for your visit?" a. If yes - list this number in appt notes as "cell" (if different from BEST phone #) and list the appointment type as a VIDEO visit in appointment notes b. If no - list the appointment type as a PHONE visit in appointment notes  3. Confirm consent - "In the setting of the current Covid19 crisis, you are scheduled for a (phone or video) visit with your provider on (date) at (time).  Just as we do with many in-office visits, in order for you to participate in this visit, we must obtain consent.  If you'd like, I can send this to your mychart (if signed up) or email for you to review.  Otherwise, I can obtain your verbal consent now.  All virtual visits are billed to your insurance company just like a normal visit would be.  By agreeing to a virtual visit, we'd like you to understand that the technology does not allow for your provider to perform an examination, and thus may limit your provider's ability to fully assess your condition. If your provider identifies any concerns that need to be evaluated in person, we will make arrangements to do so.  Finally, though the technology is pretty good, we cannot assure that it will always work on either your or our end, and in the setting of a video visit, we may have to convert it to a phone-only visit.  In either situation, we cannot ensure that we have a secure connection.  Are you willing to proceed?" STAFF: Did the patient verbally acknowledge consent to telehealth visit? Document YES/NO here: YES  4. Advise patient to be prepared - "Two hours prior to your appointment, go ahead and check your blood pressure, pulse, oxygen saturation, and your weight (if you have the equipment to check those) and write them all down.  When your visit starts, your provider will ask you for this information. If you have an Apple Watch or Kardia device, please plan to have heart rate information ready on the day of your appointment. Please have a pen and paper handy nearby the day of the visit as well."  5. Give patient instructions for MyChart download to smartphone OR [email or web address] as below if video visit (depending on what platform provider is using)  6. Inform patient they will receive a phone call 15 minutes prior to their appointment time (may be from unknown caller ID) so they should be prepared to answer    TELEPHONE CALL NOTE  Ryan Glass has been deemed a candidate for a follow-up tele-health visit to limit community exposure during the Covid-19 pandemic. I spoke with the patient via phone to ensure availability of phone/video source, confirm preferred email & phone number, and discuss instructions and expectations.  I reminded Ryan Glass to be prepared with any vital sign and/or heart rhythm information that could potentially be obtained via home monitoring, at the time  of his visit. I reminded Ryan Glass to expect a phone call prior to his visit.  Jeanann Lewandowsky, [place] 09/24/2019 10:53 AM   15 Minutes before the appointment time, someone will call you from our office to go over your vital signs, medications, and discuss any symptoms/problems/concerns you may have.  Once they get that information from you, they will let Ryan Glass know you are ready.  At that time you will receive a link via text.  Click on that link and join video.  IF  [email or web address] - The patient will receive a link just prior to their visit by text.     FULL LENGTH CONSENT FOR TELE-HEALTH VISIT   I hereby voluntarily request, consent and authorize [facility] and its employed or contracted physicians, physician assistants, nurse practitioners or other licensed health care professionals (the Practitioner), to provide me with telemedicine  health care services (the "Services") as deemed necessary by the treating Practitioner. I acknowledge and consent to receive the Services by the Practitioner via telemedicine. I understand that the telemedicine visit will involve communicating with the Practitioner through live audiovisual communication technology and the disclosure of certain medical information by electronic transmission. I acknowledge that I have been given the opportunity to request an in-person assessment or other available alternative prior to the telemedicine visit and am voluntarily participating in the telemedicine visit.  I understand that I have the right to withhold or withdraw my consent to the use of telemedicine in the course of my care at any time, without affecting my right to future care or treatment, and that the Practitioner or I may terminate the telemedicine visit at any time. I understand that I have the right to inspect all information obtained and/or recorded in the course of the telemedicine visit and may receive copies of available information for a reasonable fee.  I understand that some of the potential risks of receiving the Services via telemedicine include:  Marland Kitchen Delay or interruption in medical evaluation due to technological equipment failure or disruption; . Information transmitted may not be sufficient (e.g. poor resolution of images) to allow for appropriate medical decision making by the Practitioner; and/or  . In rare instances, security protocols could fail, causing a breach of personal health information.  Furthermore, I acknowledge that it is my responsibility to provide information about my medical history, conditions and care that is complete and accurate to the best of my ability. I acknowledge that Practitioner's advice, recommendations, and/or decision may be based on factors not within their control, such as incomplete or inaccurate data provided by me or distortions of diagnostic images or  specimens that may result from electronic transmissions. I understand that the practice of medicine is not an exact science and that Practitioner makes no warranties or guarantees regarding treatment outcomes. I acknowledge that I will receive a copy of this consent concurrently upon execution via email to the email address I last provided but may also request a printed copy by calling the office of [place].    I understand that my insurance will be billed for this visit.   I have read or had this consent read to me. . I understand the contents of this consent, which adequately explains the benefits and risks of the Services being provided via telemedicine.  . I have been provided ample opportunity to ask questions regarding this consent and the Services and have had my questions answered to my satisfaction. . I give my informed consent for the services  to be provided through the use of telemedicine in my medical care  By participating in this telemedicine visit I agree to the above.

## 2019-09-24 NOTE — Telephone Encounter (Signed)
RN saw pt in clinic 09/22/19. See RN encounter notes.

## 2019-09-24 NOTE — Telephone Encounter (Signed)
Reviewed patient -RN note.  Patient returning to clinic today for repeat BP.  ER precautions to be given again regarding elevated blood pressure.  Consider taking second bystolic tab as patient had been on BID before episode of hypotension and bradycardia.  Please obtain copy of patient BP logs also.

## 2019-09-29 ENCOUNTER — Telehealth: Payer: Self-pay | Admitting: Registered Nurse

## 2019-09-29 ENCOUNTER — Encounter: Payer: Self-pay | Admitting: Registered Nurse

## 2019-09-29 DIAGNOSIS — I10 Essential (primary) hypertension: Secondary | ICD-10-CM

## 2019-09-29 DIAGNOSIS — Z79899 Other long term (current) drug therapy: Secondary | ICD-10-CM

## 2019-09-29 MED ORDER — CHLORTHALIDONE 25 MG PO TABS: ORAL_TABLET | ORAL | 3 refills | Status: DC

## 2019-09-29 NOTE — Telephone Encounter (Signed)
Late entry  Contacted patient as did not see his BP log uploaded to cardiology in Epic today and he told me last week he would send his cardiology provider log on Monday.  He checked his mychart account and draft was saved but not sent and he sent to cardiology and I was able to review in Epic.  His blood pressure still typically running high on new dosing regimen hydralazine and two days with low pulse in the 40s.  Patient reported he has not had increase in palpitations or had chest pain but he has been feeling tired.  Patient to follow up as scheduled with cardiology provider this week, consider calling their office if he does not get a mychart message and continue his BP log.  Patient aware of ER precautions.  Noted in cardiology office visit that they may be increasing his hydralazine dosing this week again and labs drawn 09/24/2019 BMET stable.  Patient verbalized understanding information/instructions, agreed with plan of care and had no further questions at this time.

## 2019-09-29 NOTE — Telephone Encounter (Signed)
His blood pressure does not seem to have changed much with increasing his hydralazine.   I did some digging with the history of his regimen and frequent medicine changes. He'd previously been on chlorthalidone, but a higher dose. It looks like this was stopped in the midst of his low blood pressures.  I would recommend he add back chlorthalidone 25mg  but start 1/2 tablet daily for 3 days and if BP still >888 systolic increase to 1 whole tablet daily. He may have some on hand from earlier rx. Check BMET 1 week. Keep f/u as planned. Send in MyChart message if BPs remain above goal.  Melina Copa PA-C

## 2019-10-01 ENCOUNTER — Encounter: Payer: Self-pay | Admitting: Registered Nurse

## 2019-10-01 ENCOUNTER — Telehealth: Payer: Self-pay | Admitting: Registered Nurse

## 2019-10-01 DIAGNOSIS — I10 Essential (primary) hypertension: Secondary | ICD-10-CM

## 2019-10-01 NOTE — Telephone Encounter (Signed)
Spoke with Ryan Glass over phone this morning. He reports he began taking 1/2 tab Chlorthalidone yesterday. BP this morning at home 168/101 p. 57. Ryan Glass reports just fatigue as HTN sx currently. Plans to increase to 1 tablet Chlorthalidone Saturday if systolic BP remains >497. He does have leftover med at home that he is currently using.  Plan to recheck BMET on 10/27 in employee health clinic. Office lab appt can be cancelled. Will CC provider on results as employee clinic is closed on Wednesdays.   Thank you for working with [place].

## 2019-10-01 NOTE — Telephone Encounter (Signed)
Patient contacted via telephone to follow up HTN after medication/dose changes from cardiology.  He is still feeling tired but denied chest pain/palpitations, visual changes, sob, n/v/d or headache.  RN [name] reviewed cardiology instructions with patient again.  See RN note.  Appt made with RN [name] for blood draw BMET next week per cardiology orders. ER precautions discussed.  HTN stable but uncontrolled and cardiology working on his medication regimen to get it under better control.  Patient to continue his BP logs.   Patient verbalized understanding information/instructions, agreed with plan of care and had no further questions at this time.

## 2019-10-02 NOTE — Telephone Encounter (Signed)
noted 

## 2019-10-06 ENCOUNTER — Other Ambulatory Visit: Payer: Self-pay

## 2019-10-06 ENCOUNTER — Other Ambulatory Visit: Payer: PRIVATE HEALTH INSURANCE

## 2019-10-06 ENCOUNTER — Ambulatory Visit: Payer: Self-pay | Admitting: Registered Nurse

## 2019-10-06 VITALS — BP 181/123 | HR 57

## 2019-10-06 DIAGNOSIS — Z0130 Encounter for examination of blood pressure without abnormal findings: Secondary | ICD-10-CM

## 2019-10-06 DIAGNOSIS — I10 Essential (primary) hypertension: Secondary | ICD-10-CM

## 2019-10-06 NOTE — Progress Notes (Signed)
Subjective:    Patient ID: Ryan Glass, male    DOB: 12-28-1955, 63 y.o.   MRN: [id number]  63y/o Hispanic married established male pt presenting for f/u r/t BP and need for labs per cardiology request after restarting Chlorthalidone last week. Is taking 1 tab daily. Sts his home BP has been running 180's/90's, but at work has been around 200/100. Endorses mild palpitations infrequently. BP very elevated in clinic today. Took BP meds 4 hours ago. Only 16-20oz of coffee today. BMET drawn, nonfasting. CC'd PA [employer] on results. Pt also plans to upload home BP log to MyChart this evening for her to review.  Patient denied chest pain, headache, sob, hand/feet swelling, visual changes, change in his palpitations.  Has decreased his afternoon caffeine intake.  A bit frustrated unable to get blood pressure down  Fatigue/tiredness not as bad today.  Patient asking if wrist bp cuff as accurate as upper arm cuff.       Review of Systems  Constitutional: Positive for fatigue. Negative for activity change, appetite change, chills, diaphoresis and fever.  HENT: Negative for trouble swallowing and voice change.   Eyes: Negative for photophobia and visual disturbance.  Respiratory: Negative for cough, choking, chest tightness, shortness of breath, wheezing and stridor.   Gastrointestinal: Negative for diarrhea, nausea and vomiting.  Endocrine: Negative for cold intolerance and heat intolerance.  Genitourinary: Negative for difficulty urinating.  Musculoskeletal: Negative for gait problem, neck pain and neck stiffness.  Allergic/Immunologic: Positive for environmental allergies. Negative for food allergies.  Neurological: Negative for dizziness, tremors, seizures, syncope, facial asymmetry, speech difficulty, weakness, light-headedness, numbness and headaches.  Hematological: Negative for adenopathy. Does not bruise/bleed easily.  Psychiatric/Behavioral: Negative for agitation, confusion and sleep  disturbance.       Objective:   Physical Exam Vitals signs and nursing note reviewed.  Constitutional:      General: He is awake. He is not in acute distress.    Appearance: Normal appearance. He is well-developed and well-groomed. He is not ill-appearing, toxic-appearing or diaphoretic.  HENT:     Head: Normocephalic and atraumatic.     Jaw: There is normal jaw occlusion.     Right Ear: External ear normal.     Left Ear: External ear normal.     Nose: Nose normal.     Mouth/Throat:     Mouth: Mucous membranes are moist.     Pharynx: Oropharynx is clear.  Eyes:     General: Lids are normal. Vision grossly intact. Gaze aligned appropriately. Allergic shiner present. No visual field deficit or scleral icterus.       Right eye: No discharge.        Left eye: No discharge.     Extraocular Movements: Extraocular movements intact.     Conjunctiva/sclera: Conjunctivae normal.     Pupils: Pupils are equal, round, and reactive to light.  Neck:     Musculoskeletal: Normal range of motion and neck supple. Normal range of motion. No edema, erythema, neck rigidity, crepitus, injury, pain with movement or torticollis.     Trachea: Trachea normal.  Cardiovascular:     Rate and Rhythm: Normal rate and regular rhythm.     Pulses: Normal pulses.          Radial pulses are 2+ on the right side and 2+ on the left side.     Heart sounds: Normal heart sounds.  Pulmonary:     Effort: Pulmonary effort is normal. No respiratory distress.  Breath sounds: Normal breath sounds and air entry. No stridor, decreased air movement or transmitted upper airway sounds. No decreased breath sounds, wheezing, rhonchi or rales.  Abdominal:     General: Abdomen is flat.  Musculoskeletal: Normal range of motion.        General: No swelling, tenderness, deformity or signs of injury.     Right shoulder: Normal.     Left shoulder: Normal.     Right elbow: Normal.    Left elbow: Normal.     Right hip: Normal.      Left hip: Normal.     Right knee: Normal.     Left knee: Normal.     Right ankle: Normal.     Left ankle: Normal.     Cervical back: Normal.     Right hand: Normal.     Left hand: Normal.     Right lower leg: No edema.     Left lower leg: No edema.  Lymphadenopathy:     Head:     Right side of head: No preauricular adenopathy.     Left side of head: No preauricular adenopathy.     Cervical: No cervical adenopathy.     Right cervical: No superficial cervical adenopathy.    Left cervical: No superficial cervical adenopathy.  Skin:    General: Skin is warm and dry.     Capillary Refill: Capillary refill takes less than 2 seconds.     Coloration: Skin is not ashen, cyanotic, jaundiced, mottled, pale or sallow.     Findings: No abrasion, abscess, acne, bruising, burn, ecchymosis, erythema, signs of injury, laceration, lesion, petechiae, rash or wound.     Nails: There is no clubbing.   Neurological:     General: No focal deficit present.     Mental Status: He is alert and oriented to person, place, and time. Mental status is at baseline.     GCS: GCS eye subscore is 4. GCS verbal subscore is 5. GCS motor subscore is 6.     Cranial Nerves: Cranial nerves are intact. No cranial nerve deficit, dysarthria or facial asymmetry.     Sensory: Sensation is intact. No sensory deficit.     Motor: Motor function is intact. No weakness, tremor, atrophy, abnormal muscle tone or seizure activity.     Coordination: Coordination is intact. Coordination normal.     Gait: Gait is intact. Gait normal.     Comments: In/out of chair without difficulty; gait sure and steady in hallway; bilateral hand grasp equal 5/5  Psychiatric:        Attention and Perception: Attention and perception normal.        Mood and Affect: Mood and affect normal.        Speech: Speech normal.        Behavior: Behavior normal. Behavior is cooperative.        Thought Content: Thought content normal.        Cognition and Memory:  Cognition and memory normal.        Judgment: Judgment normal.           Assessment & Plan:  A-essential hypertension  P- Resistant hypertension and patient working with cardiologist to find him regimen he can tolerate.  Bystolic increased to  BID 08/13/2019 lowered his blood pressure and pulse to point of mental fog/bradycardia/hypotension/worsened  palpitations 08/27/2019 stopped and eventually restarted by cardiology daily. TID Hydralazine dose increased and chlorthalidone added back in/lasix stopped.  Plaquenil also started for  arthritis in the past year.  patient to avoid further caffeine intake today.  May continue am coffee at breakfast 8-16 oz but stop lunch/pm coffee/caffeine.  Continue BP medications per cardiology.  Patient is due to take hydralazine 50mg  po lunchtime and recheck his BP on his machine.  Discussed for him to bring his wrist BP machine in from home and will compare with clinic machine.  Patient states numbers on his two machines very close when he checked them against each other.  Hydrate with water to keep urine clear pale yellow and voiding every 4-6 hours while awake.  ER if worst headache of his life, chest pain not relieved with rest, visual changes or syncope.  Lab results will be back tomorrow.  I will call him if anything urgent seen on labs in the morning/[facility] clinic closed tomorrow.  Exitcare handout managing your hypertension.  Patient verbalized understanding information/instructions, agreed with plan of care and had no further questions at this time.

## 2019-10-06 NOTE — Telephone Encounter (Signed)
Patient seen in clinic today for serum draw BMET with RN [facility] cardiology ordered after restarting chlorthalidone 1/2 then whole tab.  Patient has been taking whole tab a couple days now.  Stated feeling a little better.  Blood pressure running higher at work than at home.  Has maintained drinking 1 cup coffee instead of his usual 4 throughout the day large size.  He has been maintaining BP log.  At home 180/85-90 and at work 200/100 this past week.  Encouraged patient to take his medications as prescribed every day and avoid caffeine intake after his breakfast coffee.  ER Precautions discussed with patient e.g. chest pain, dyspnea, worst headache of his life.  Patient reported he doesn't notice palpitations so much but he does have some.  They are not bad at this time.

## 2019-10-06 NOTE — Patient Instructions (Signed)
Managing Your Hypertension Hypertension is commonly called high blood pressure. This is when the force of your blood pressing against the walls of your arteries is too strong. Arteries are blood vessels that carry blood from your heart throughout your body. Hypertension forces the heart to work harder to pump blood, and may cause the arteries to become narrow or stiff. Having untreated or uncontrolled hypertension can cause heart attack, stroke, kidney disease, and other problems. What are blood pressure readings? A blood pressure reading consists of a higher number over a lower number. Ideally, your blood pressure should be below 120/80. The first ("top") number is called the systolic pressure. It is a measure of the pressure in your arteries as your heart beats. The second ("bottom") number is called the diastolic pressure. It is a measure of the pressure in your arteries as the heart relaxes. What does my blood pressure reading mean? Blood pressure is classified into four stages. Based on your blood pressure reading, your health care provider may use the following stages to determine what type of treatment you need, if any. Systolic pressure and diastolic pressure are measured in a unit called mm Hg. Normal  Systolic pressure: below 120.  Diastolic pressure: below 80. Elevated  Systolic pressure: 120-129.  Diastolic pressure: below 80. Hypertension stage 1  Systolic pressure: 130-139.  Diastolic pressure: 80-89. Hypertension stage 2  Systolic pressure: 140 or above.  Diastolic pressure: 90 or above. What health risks are associated with hypertension? Managing your hypertension is an important responsibility. Uncontrolled hypertension can lead to:  A heart attack.  A stroke.  A weakened blood vessel (aneurysm).  Heart failure.  Kidney damage.  Eye damage.  Metabolic syndrome.  Memory and concentration problems. What changes can I make to manage my  hypertension? Hypertension can be managed by making lifestyle changes and possibly by taking medicines. Your health care provider will help you make a plan to bring your blood pressure within a normal range. Eating and drinking   Eat a diet that is high in fiber and potassium, and low in salt (sodium), added sugar, and fat. An example eating plan is called the DASH (Dietary Approaches to Stop Hypertension) diet. To eat this way: ? Eat plenty of fresh fruits and vegetables. Try to fill half of your plate at each meal with fruits and vegetables. ? Eat whole grains, such as whole wheat pasta, brown rice, or whole grain bread. Fill about one quarter of your plate with whole grains. ? Eat low-fat diary products. ? Avoid fatty cuts of meat, processed or cured meats, and poultry with skin. Fill about one quarter of your plate with lean proteins such as fish, chicken without skin, beans, eggs, and tofu. ? Avoid premade and processed foods. These tend to be higher in sodium, added sugar, and fat.  Reduce your daily sodium intake. Most people with hypertension should eat less than 1,500 mg of sodium a day.  Limit alcohol intake to no more than 1 drink a day for nonpregnant women and 2 drinks a day for men. One drink equals 12 oz of beer, 5 oz of wine, or 1 oz of hard liquor. Lifestyle  Work with your health care provider to maintain a healthy body weight, or to lose weight. Ask what an ideal weight is for you.  Get at least 30 minutes of exercise that causes your heart to beat faster (aerobic exercise) most days of the week. Activities may include walking, swimming, or biking.  Include exercise   to strengthen your muscles (resistance exercise), such as weight lifting, as part of your weekly exercise routine. Try to do these types of exercises for 30 minutes at least 3 days a week.  Do not use any products that contain nicotine or tobacco, such as cigarettes and e-cigarettes. If you need help quitting,  ask your health care provider.  Control any long-term (chronic) conditions you have, such as high cholesterol or diabetes. Monitoring  Monitor your blood pressure at home as told by your health care provider. Your personal target blood pressure may vary depending on your medical conditions, your age, and other factors.  Have your blood pressure checked regularly, as often as told by your health care provider. Working with your health care provider  Review all the medicines you take with your health care provider because there may be side effects or interactions.  Talk with your health care provider about your diet, exercise habits, and other lifestyle factors that may be contributing to hypertension.  Visit your health care provider regularly. Your health care provider can help you create and adjust your plan for managing hypertension. Will I need medicine to control my blood pressure? Your health care provider may prescribe medicine if lifestyle changes are not enough to get your blood pressure under control, and if:  Your systolic blood pressure is 130 or higher.  Your diastolic blood pressure is 80 or higher. Take medicines only as told by your health care provider. Follow the directions carefully. Blood pressure medicines must be taken as prescribed. The medicine does not work as well when you skip doses. Skipping doses also puts you at risk for problems. Contact a health care provider if:  You think you are having a reaction to medicines you have taken.  You have repeated (recurrent) headaches.  You feel dizzy.  You have swelling in your ankles.  You have trouble with your vision. Get help right away if:  You develop a severe headache or confusion.  You have unusual weakness or numbness, or you feel faint.  You have severe pain in your chest or abdomen.  You vomit repeatedly.  You have trouble breathing. Summary  Hypertension is when the force of blood pumping  through your arteries is too strong. If this condition is not controlled, it may put you at risk for serious complications.  Your personal target blood pressure may vary depending on your medical conditions, your age, and other factors. For most people, a normal blood pressure is less than 120/80.  Hypertension is managed by lifestyle changes, medicines, or both. Lifestyle changes include weight loss, eating a healthy, low-sodium diet, exercising more, and limiting alcohol. This information is not intended to replace advice given to you by your health care provider. Make sure you discuss any questions you have with your health care provider. Document Released: 08/20/2012 Document Revised: 03/20/2019 Document Reviewed: 10/24/2016 Elsevier Patient Education  2020 [employer]  

## 2019-10-07 ENCOUNTER — Telehealth: Payer: Self-pay | Admitting: *Deleted

## 2019-10-07 LAB — BASIC METABOLIC PANEL
BUN/Creatinine Ratio: 14 (ref 10–24)
BUN: 17 mg/dL (ref 8–27)
CO2: 22 mmol/L (ref 20–29)
Calcium: 9.4 mg/dL (ref 8.6–10.2)
Chloride: 104 mmol/L (ref 96–106)
Creatinine, Ser: 1.23 mg/dL (ref 0.76–1.27)
GFR calc Af Amer: 72 mL/min/{1.73_m2} (ref >59)
GFR calc non Af Amer: 62 mL/min/{1.73_m2} (ref >59)
Glucose: 85 mg/dL (ref 65–99)
Potassium: 4.1 mmol/L (ref 3.5–5.2)
Sodium: 138 mmol/L (ref 134–144)

## 2019-10-07 MED ORDER — AMLODIPINE BESYLATE 5 MG PO TABS: 5.0000 mg | ORAL_TABLET | Freq: Every day | ORAL | 3 refills | Status: DC

## 2019-10-07 NOTE — Telephone Encounter (Signed)
See result note from [facility] yesterday, can you make sure amlodipine gets restarted? Also see commentary about co-management of BP his [employer] team. Melina Copa PA-C

## 2019-10-07 NOTE — Telephone Encounter (Signed)
-----   Message from Charlie Pitter, [place] sent at 10/07/2019  8:02 AM EDT ----- Labs are totally stable after initiation of chlorthalidone. PCP notes indicate blood pressure has still been running high.   When he saw [name] earlier this fall he was on amlodipine 5mg  daily and did not have any swelling at that visit. If he hasn't had any recurrent swelling would re-initiate this medicine next and continue to let us know if BP is running high.  It does look like he is being co-managed by primary care as well. I would feel comfortable with them driving his blood pressure control if he wanted it to come from one source instead of ping ponging back and forth between our office and theirs - consistency will be important given that he's had so many adjustments between different providers. 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 Happy to continue to follow BP but just wanted to make that an option since primary care is so closely involved (does not necessarily need to see cardiology specifically for BP management unless he wants to).  Thanks, [employer]

## 2019-10-08 NOTE — Progress Notes (Signed)
Good morning, Thank you for following up on this. To clarify, Otila Kluver and myself work at [employer] and [employer]. There are 2 of [place] for 400+ employees, so we actually do not provide primary care services. It was due to Ayvin's multiple providers and uncontrolled BP especially in the setting of his PVCs and aneurysm Hx that we got so involved to make sure he was following up and getting the management he badly needed. We do not start or modify chronic meds unless on a temporary basis while working to get a pt in with an external provider. His primary care as previously deferred to cardiology for BP management as well.  Thanks so much for working with [place] and putting up with the multiple communications. Feel free to let us know at any time if you need anything from [place]!

## 2019-10-08 NOTE — Progress Notes (Signed)
noted 

## 2019-10-09 ENCOUNTER — Other Ambulatory Visit: Payer: Self-pay

## 2019-10-09 ENCOUNTER — Ambulatory Visit (INDEPENDENT_AMBULATORY_CARE_PROVIDER_SITE_OTHER)
Admission: RE | Admit: 2019-10-09 | Discharge: 2019-10-09 | Disposition: A | Discharge status: Home / Self Care | Payer: PRIVATE HEALTH INSURANCE | Source: Ambulatory Visit | Attending: Cardiology | Admitting: Cardiology

## 2019-10-09 DIAGNOSIS — I712 Thoracic aortic aneurysm, without rupture, unspecified: Secondary | ICD-10-CM

## 2019-10-09 IMAGING — CT CT ANGIO CHEST
2 of 7 series · 16 of 46 positions shown · IV contrast (OMNIPAQUE 350)
Comparison: Chest CT-[DATE]; [DATE]

CLINICAL DATA: Follow-up thoracic aortic aneurysm.

EXAM:
CT ANGIOGRAPHY CHEST WITH CONTRAST
TECHNIQUE: Multidetector CT imaging of the chest was performed using the
standard protocol during bolus administration of intravenous
contrast. Multiplanar CT image reconstructions and MIPs were
obtained to evaluate the vascular anatomy.
CONTRAST:  100mL OMNIPAQUE IOHEXOL 350 MG/ML SOLN

[Series 4: aorta 3.0 i31f 2 · axial · 0.84mm/px · z∈[-374,-86]mm · 13 of 110 slices shown]
[im 9/110  lung]
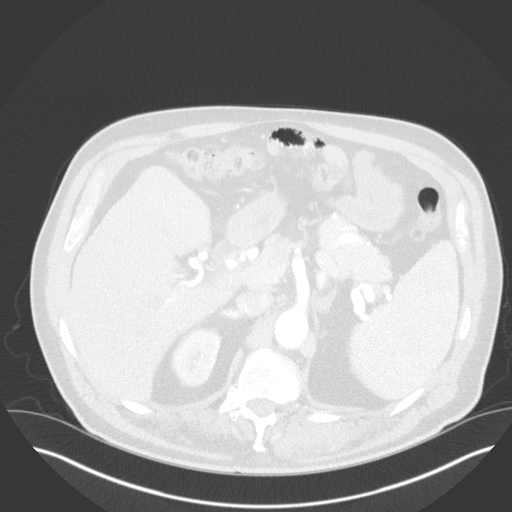
[im 17/110  soft-tissue]
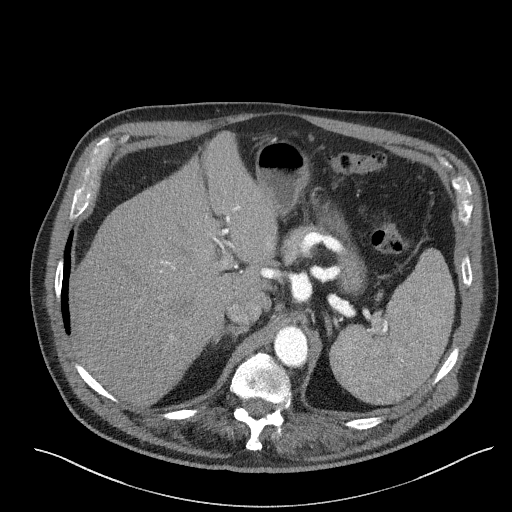
[im 25/110  lung]
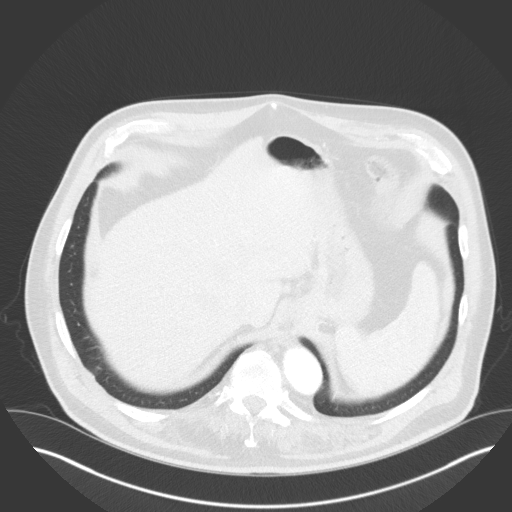
[im 33/110  soft-tissue]
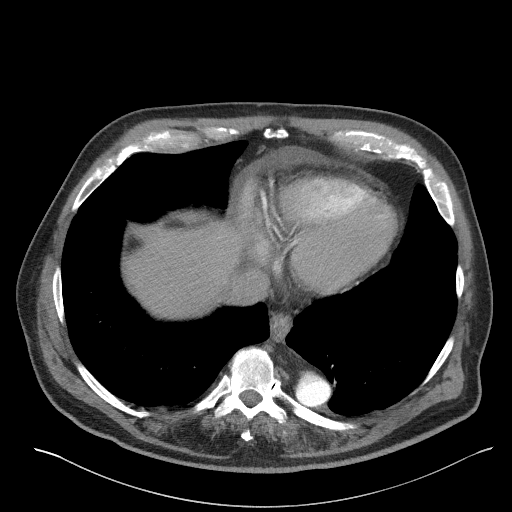
[im 41/110  lung]
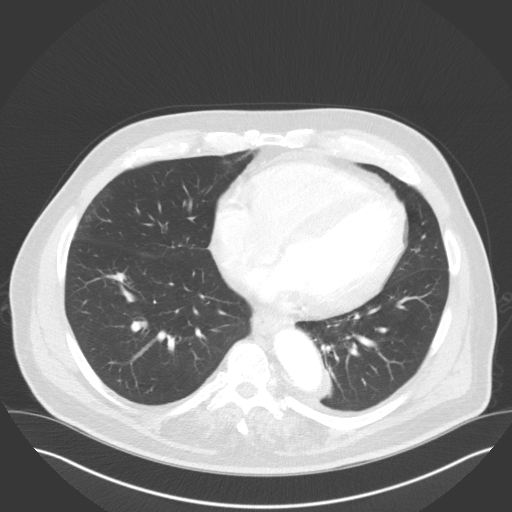
[im 49/110  soft-tissue]
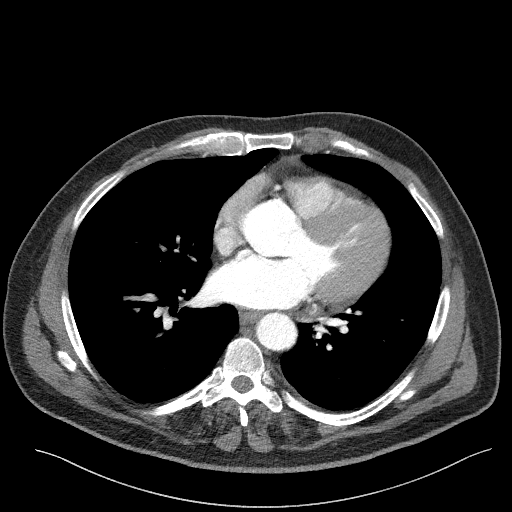
[im 57/110  lung]
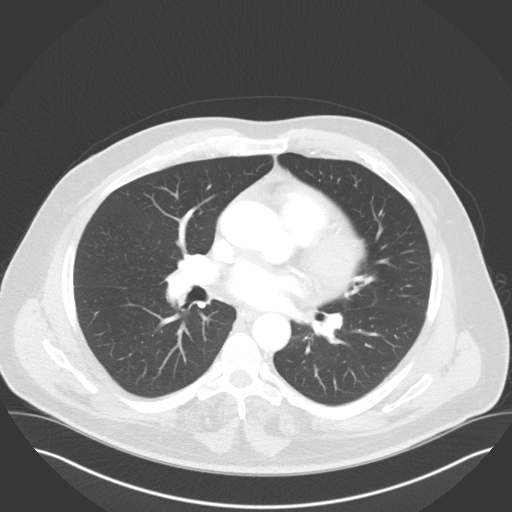
[im 65/110  soft-tissue]
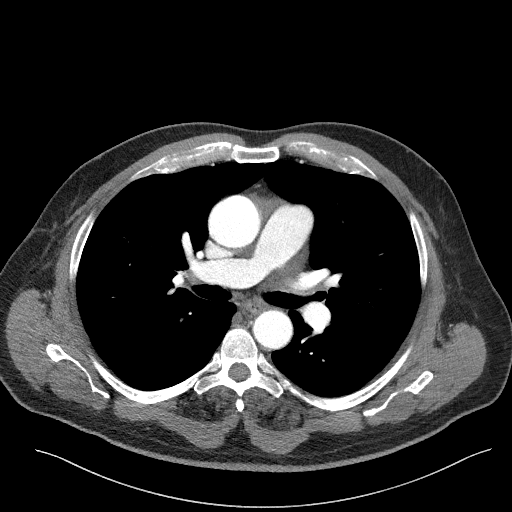
[im 73/110  lung]
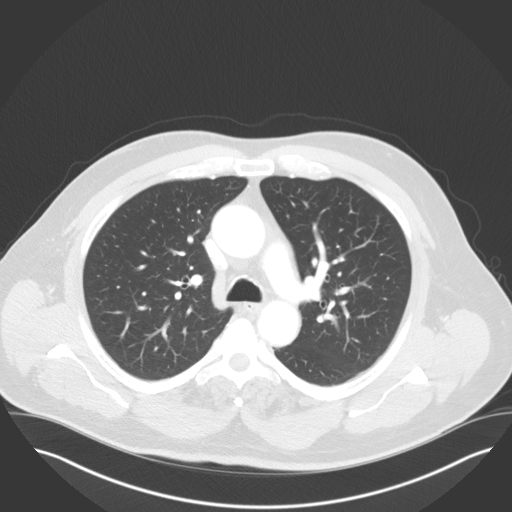
[im 81/110  soft-tissue]
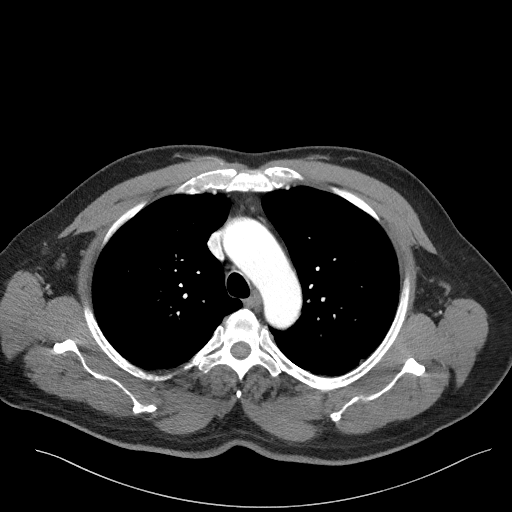
[im 89/110  lung]
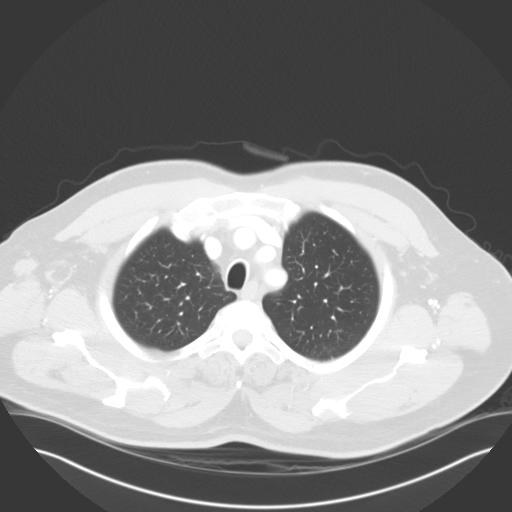
[im 97/110  soft-tissue]
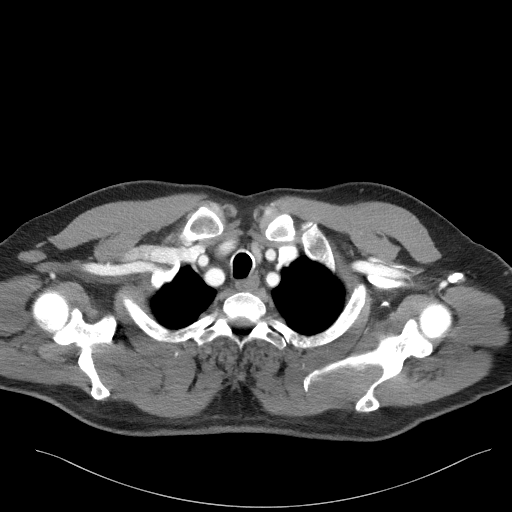
[im 105/110  lung]
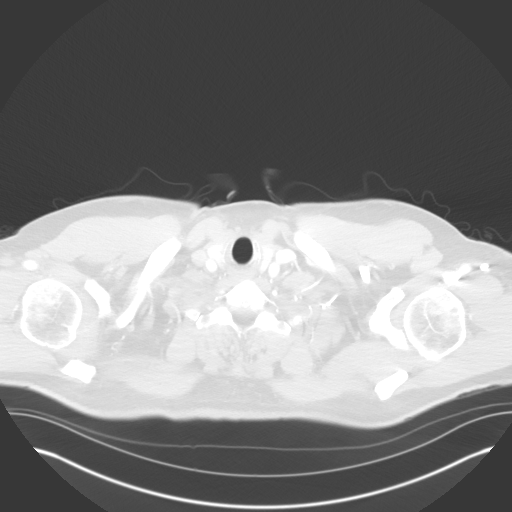

[Series 7: coronals · coronal · 0.65mm/px · 3 of 132 slices shown]
[im 33/132  soft-tissue]
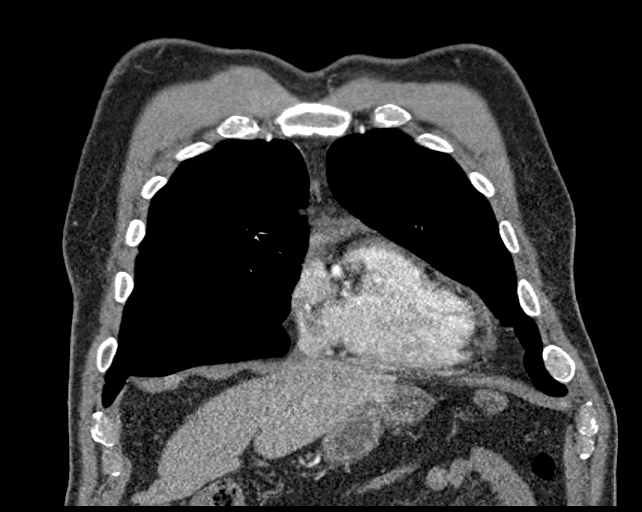
[im 66/132  soft-tissue]
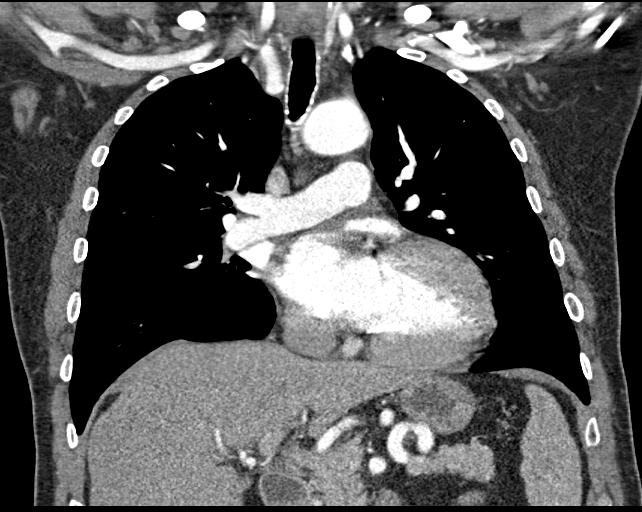
[im 99/132  soft-tissue]
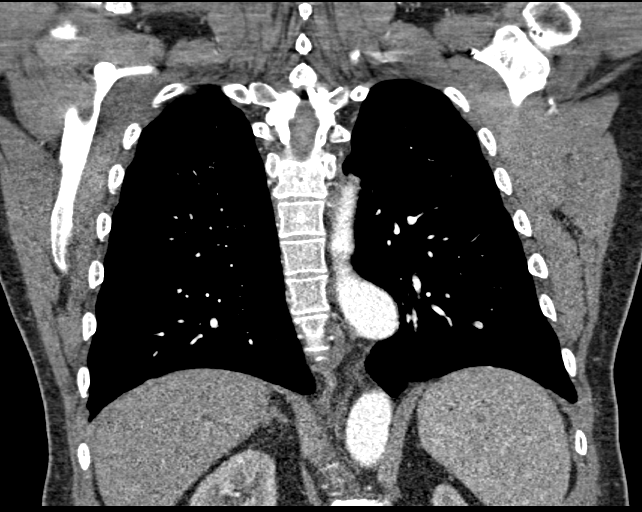

[16 of 46 positions shown; findings below may reference images not displayed]

FINDINGS: Vascular Findings:

Grossly unchanged fusiform aneurysmal dilatation of the ascending
thoracic aorta was measurements as follows. The thoracic aorta
tapers to a normal level at the level of the aortic arch. No
evidence of thoracic aortic dissection or periaortic stranding on
this nongated examination.

There is a minimal amount of calcified atherosclerotic plaque
involving the aortic arch and descending thoracic aorta, not
resulting in a hemodynamically significant stenosis. Note is made of
a small ductus diverticulum. The descending thoracic aorta is noted
to be tortuous, especially at its distal aspect, but of normal
caliber and widely patent.

Conventional configuration of the aortic arch. The branch vessels of
the aortic arch are tortuous though appear widely patent without
hemodynamically significant narrowing.

Cardiomegaly. Trace pericardial effusion with small amount of fluid
seen within the pericardial recess, grossly unchanged.

Although this examination was not tailored for the evaluation the
pulmonary arteries, there are no discrete filling defects within the
central pulmonary arterial tree to suggest central pulmonary
embolism. Borderline enlarged caliber of the main pulmonary artery
measuring 33 mm in diameter.

-------------------------------------------------------------

Thoracic aortic measurements:

Sinotubular junction

42 mm as measured in greatest oblique short axis coronal dimension.

Proximal ascending aorta

43 mm as measured in greatest oblique short axis axial dimension at
the level of the main pulmonary artery (image 46, series 4) and 43
mm in greatest oblique short axis coronal diameter (coronal image
46, series 7), unchanged compared to the [DATE] examination,
previously, 43 mm and 44 mm respectively.

Aortic arch aorta

37 mm as measured in greatest oblique short axis sagittal dimension.

Proximal descending thoracic aorta

33 mm as measured in greatest oblique short axis axial dimension at
the level of the main pulmonary artery.

Distal descending thoracic aorta

29 mm as measured in greatest oblique short axis axial dimension at
the level of the diaphragmatic hiatus.

Review of the MIP images confirms the above findings.

-------------------------------------------------------------

Non-Vascular Findings:

Mediastinum/Lymph Nodes: No bulky mediastinal, hilar axillary
lymphadenopathy.

Lungs/Pleura: No focal airspace opacities. Minimal subsegmental
atelectasis noted about the tortuous descending thoracic aorta,
improved compared to the [EI] examination. Minimal linear
subsegmental atelectasis within the right lower lobe. No new focal
airspace opacities. No pleural effusion or pneumothorax. The central
pulmonary airways appear widely patent.

No discrete pulmonary nodules.

Upper abdomen: Limited early arterial phase evaluation of the upper
abdomen demonstrates fusiform ectasia of the distal aspect of the
celiac trunk prior to the vessel's bifurcation measuring
approximately 1.7 cm in greatest short axis axial diameter (image
96, series 4), unchanged compared to the [EI] examination.

Musculoskeletal: No acute or aggressive osseous abnormalities. Mild
(approximately 25%) compression deformity involving the approximate
T12 vertebral body, unchanged. Mild DDD of C6-C7 with disc space
height loss, endplate irregularity. Regional soft tissues appear
normal. Normal appearance of the thyroid gland.
IMPRESSION: 1. Stable uncomplicated mild fusiform aneurysmal dilatation of the
ascending thoracic aorta measuring 43 mm in diameter, unchanged
compared to the [DATE] examination. Aortic aneurysm NOS
([EI]-[EI]).
2. Similar findings of cardiomegaly, mild enlargement of the caliber
of the main pulmonary artery and trace pericardial effusion. Further
evaluation cardiac echo could be performed as indicated.
3. Stable uncomplicated mild fusiform ectasia of the distal aspect
of the celiac trunk measuring 1.7 cm in diameter, unchanged compared
to the [EI] examination.
4.  Aortic Atherosclerosis ([EI]-[EI]).

## 2019-10-09 MED ORDER — IOHEXOL 350 MG/ML SOLN
100.0000 mL | Freq: Once | INTRAVENOUS | Status: AC | PRN
  Administered 2019-10-09: 100 mL via INTRAVENOUS

## 2019-10-11 NOTE — Progress Notes (Addendum)
Virtual Visit via Video Note   This visit type was conducted due to national recommendations for restrictions regarding the COVID-19 Pandemic (e.g. social distancing) in an effort to limit this patient's exposure and mitigate transmission in our community.  Due to his co-morbid illnesses, this patient is at least at moderate risk for complications without adequate follow up.  This format is felt to be most appropriate for this patient at this time.  All issues noted in this document were discussed and addressed.  A limited physical exam was performed with this format.  The patient's consent for telehealth visit with [facility] was obtained verbally at time of last in-office OV.   The patient was offered a virtual versus in person office visit in an effort to limit physical exposure during the Covid-19 pandemic and they elected to proceed with virtual visit.  Date:  10/12/2019   ID:  Ryan Glass, DOB 26-Nov-1956, MRN [id number]  Patient Location: Car Provider Location: Office ([address] [place])  PCP:  Ryan Peers, MD  Cardiologist:  Ryan Him, MD  Electrophysiologist:  Ryan Peru, MD   Evaluation Performed:  Follow-Up Visit  Chief Complaint:  F/u BP  History of Present Illness:    Ryan Glass is a 63 y.o. 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  He was remotely followed by Dr. Ubaldo Glass then established care with Dr. Radford Glass in 03/2018 for episodic atypical chest pain and frequent PVCs. 2D echo 04/2018 showed EF 60-65%, grade 2 DD, moderately dilated aortic root, trileaflet AV, mildly dilated ascending aorta, moderately dilated LA, mildly dilated RV. Nuclear stress test 05/2018 was normal. 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 He was referred to Ryan Glass who felt that the PVCs were originating to the lateral wall of the inferior apex of the RV. He underwent successful PVCablation in November 2019 and initially did well. Atenolol was weaned off.   When seen in follow-up 01/2019 his blood pressure was elevated. He was having a lot of edema on amlodipine so was changed to [employer]. He had not been compliant with CPAP which was felt to be contributing. He was seen by DOD Dr. Harrington Glass in 07/2019 as his HR was low in the 30s on home BP cuff. He came to the office and was found to have frequent PVCs (likely not being picked up on BP monitor). Dr. Harrington Glass reviewed with Ryan Glass. The PVCs appeared to be coming from high in the LV, different foci than previous. 2-day event monitor showed min HR of 41 bpm, max HR of 146 bpm, and avg HR of 62, predominant rhythm NSR with first degree AVB, frequent PVCs (8.2%), bundle branch block present, 1 run SVT lasting 5 beats, rare PACs, occasional ventricular bigeminy and trigeminy. He saw Ryan Glass in follow-up with EP 08/13/19 who stated that PVCs by her review appeared to be predominantly of a single morphology though did have another; he also had SR with and without BBB. Ryan Glass reviewed with Ryan Glass who felt there was no indication to repeat ablation. She increased Bystolic to 10mg  daily and recommended f/u with Dr. Radford Glass. His blood pressure was normal at  that time. Of note, he also had a normal renal artery duplex 04/2019. On his renin/aldo result, it states "Normal urinary catecholamines but PRA elevated with normal aldosterone - see telephone note concerning referral to nephrology" but I could not find a corresponding telephone note.  There is a MyChart message 09/10/19 indicating his BP and pulse rate were low so he'd spoken to the on-call  provider who recommended to stop all BP medications and only take hydralazine PRN SBP>140. (Amlodipine, Lasix, losartan and Bystolic were stopped. Of note, his antihypertensive regimen is otherwise a bit tricky to follow. He appears to previously have been on losartan-HCTZ, then chlorthalidone at one point, then losartan on its own without diuretic. At one point over the summer his creatinine got up to 1.6 but do not see that was a time when diuretic was specifically stopped.) He saw Dr. Mayford Glass back virtually 09/11/19 and his blood pressure was trending upward. Dr. Mayford Glass restarted Bystolic 5mg  daily and continued hydralazine. I saw Glass back 09/24/19 and his blood pressure was elevated again. Hydralazine was increased and across the course of phone notes we also re-added chlorthalidone and then amlodipine. F/u BMET was stable. Referral to nephrology was placed as Dr. Mayford Glass had previously recommended. Otherwise last labs 09/2019 showed LDL 49, normal ALT, K 4.1, Cr 1.23, 02/2019 TSH, 09/2018 CBC wnl. Follow-up surveillance CTA 10/09/19 showed stable 43mm mild fusiform aneurysmal dilatation of the ascending thoracic aorta, mild enlargement of pulmonary artery and trace pericardial effusion, stable ectasia of celiac trunk, aortic atherosclerosis. Dr. Mayford Glass recommended repeat echo to f/u enlarged pulmonary artery and cardiomegaly which is pending.  He is seen back virtually for follow-up. BP log reviewed (see mychart message). His workplace team is also helping to coordinate his blood pressure and lab checks. BP remains elevated, minimal progress from prior. He does note trace edema with amlodipine but it's tolerable. No recent palpitations, dyspnea or chest pain. He has been compliant with CPAP for most hours of the night. The patient does not have symptoms concerning for COVID-19 infection (fever, chills, cough, or new shortness of breath). We also discussed his CT report.    Past Medical History:  Diagnosis  Date   Aortic atherosclerosis (HCC)    CKD (chronic kidney disease), stage II    Hyperlipidemia LDL goal <70    Hypertension    Lower extremity edema    OSA (obstructive sleep apnea)    PVC's (premature ventricular contractions)    Status post PVC ablation by Dr. Ladona Ridgelaylor 2019 with recurrence of frequent PVCs/bigeminy   Splenomegaly    a. mild noted by prior CT.   Thoracic aortic aneurysm (HCC)    46 mm aortic root and 39 mm ascending aorta by echo 2019.  Chest CT 05/2018 showed 4.3 cm thoracic aorta.   Umbilical hernia    Past Surgical History:  Procedure Laterality Date   PVC ABLATION N/A 10/07/2018   Procedure: PVC ABLATION;  Surgeon: Marinus Mawaylor, Gregg W, MD;  Location: [facility] IN[facility]AS[facility];  Service: Cardiovascular;  Laterality: N/A;     Current Meds  Medication Sig   acetaminophen-codeine (TYLENOL #3) 300-30 MG tablet 1-2 tablets as needed.    amLODipine (NORVASC) 5 MG tablet Take 1 tablet (5 mg total) by mouth daily.   atorvastatin (LIPITOR) 20 MG tablet TAKE ONE TABLET BY MOUTH DAILY   chlorthalidone (HYGROTON) 25 MG tablet Take 1/2 tablet daily for 3 days, if bp is still greater than 130 systolic, increase to 1 tablet daily  colchicine 0.6 MG tablet Take 1-2 tablets by mouth daily as needed (gout).    diclofenac (CATAFLAM) 50 MG tablet Take 50 mg by mouth daily.    fluticasone (FLONASE) 50 MCG/ACT nasal spray INSTILL 1 SPRAY INTO BOTH NOSTRILS TWO TIMES A DAY   gabapentin (NEURONTIN) 600 MG tablet Take 600 mg by mouth 3 (three) times daily.    hydrALAZINE (APRESOLINE) 25 MG tablet Take 2 tablets (50 mg total) by mouth 3 (three) times daily.   hydroxychloroquine (PLAQUENIL) 200 MG tablet Take by mouth daily.    montelukast (SINGULAIR) 10 MG tablet TAKE ONE TABLET BY MOUTH EVERY NIGHT AT BEDTIME   nebivolol (BYSTOLIC) 5 MG tablet Take 1 tablet (5 mg total) by mouth daily.   potassium chloride SA (KLOR-CON) 20 MEQ tablet Take 20 mEq by mouth 2 (two) times  daily. 2 tablets BID     Allergies:   Amlodipine besylate and Aspirin   Social History   Tobacco Use   Smoking status: Never Smoker   Smokeless tobacco: Never Used  Substance Use Topics   Alcohol use: Yes   Drug use: Never     Family Hx: The patient's family history includes Cardiomyopathy in his mother; Heart attack in his father.  ROS:   Please see the history of present illness.    All other systems reviewed and are negative.   Prior CV studies:    Most recent pertinent cardiac studies are outlined above.  Labs/Other Tests and Data Reviewed:    EKG:  An ECG dated 09/24/19 was personally reviewed today and demonstrated:  sinus bradycardia 59bpm, 1st degree AVB, rightward axis, one PVC, QTc , otherwise no acute changes  Recent Labs: 02/26/2019: TSH 2.260 07/23/2019: Magnesium 2.0 09/24/2019: ALT 21 10/06/2019: BUN 17; Creatinine, Ser 1.23; Potassium 4.1; Sodium 138   Recent Lipid Panel Lab Results  Component Value Date/Time   CHOL 104 09/24/2019 11:02 AM   TRIG 98 09/24/2019 11:02 AM   HDL 36 (L) 09/24/2019 11:02 AM   CHOLHDL 2.9 09/24/2019 11:02 AM   LDLCALC 49 09/24/2019 11:02 AM    Wt Readings from Last 3 Encounters:  10/12/19 242 lb (109.8 kg)  09/24/19 246 lb 1.9 oz (111.6 kg)  09/11/19 238 lb (108 kg)     Objective:    Vital Signs:  BP (!) 159/116    Pulse 80    Ht 5\' 10"  (1.778 m)    Wt 242 lb (109.8 kg)    BMI 34.72 kg/m    VS reviewed. General - pleasant hispanic M in no acute distress HEENT - NCAT, EOM intact Pulm - No labored breathing, no coughing during visit, no audible wheezing, speaking in full sentences Neuro - A+Ox3, no slurred speech, answers questions appropriately Psych - Pleasant affect     ASSESSMENT & PLAN:    1. Essential HTN with ongoing medication management - currently uncontrolled but patient is very engaged in his care. His workplace healthcare team is also actively engaged in assisting with this. At one point  he had excellent control earlier this year but regimen was disrupted by labile kidney function and swings towards hypotension as well. He is awaiting renal consultation for low renin ratio as previously requested by Dr. Mayford Knife. He previously had lower extremity edema on higher doses of amlodipine so would hold dose at 5mg  daily for now. Will re-try initiation of losartan at 1/2 tablet of his prior dose which was 100mg . He will take 50mg  daily for now. Recommend BMET at  his workplace on Thursday 11/5 to trend. If BP is still elevated and Cr is stable, will plan to further titrate losartan to 100mg  daily. Will plan virtual f/u again in 2 weeks. 2. CT findings of TAA and enlarged pulmonary artery - Dr. recommended a f/u echocardiogram to reassess his enlarged PA. This is pending. 3. PVCs - quiescent by patient report. Continue BB.  See prior OV from 09/24/19 for comprehensive assessment of other cardiology issues.   COVID-19 Education: Discussed at previous visit, did not revisit today.  Time:   Today, I have spent 11 minutes with the patient with telehealth technology discussing the above problems.     Medication Adjustments/Labs and Tests Ordered: Current medicines are reviewed at length with the patient today.  Concerns regarding medicines are outlined above.   Disposition:  Follow up in 2 weeks virtually with me.  Signed, 09/26/19, PA-C  10/12/2019 3:40 PM    [facility]

## 2019-10-12 ENCOUNTER — Encounter: Payer: Self-pay | Admitting: Physician Assistant

## 2019-10-12 ENCOUNTER — Other Ambulatory Visit: Payer: Self-pay

## 2019-10-12 ENCOUNTER — Telehealth: Payer: Self-pay

## 2019-10-12 ENCOUNTER — Telehealth (INDEPENDENT_AMBULATORY_CARE_PROVIDER_SITE_OTHER): Payer: PRIVATE HEALTH INSURANCE | Admitting: Physician Assistant

## 2019-10-12 VITALS — BP 159/116 | HR 80 | Ht 70.0 in | Wt 242.0 lb

## 2019-10-12 DIAGNOSIS — Z79899 Other long term (current) drug therapy: Secondary | ICD-10-CM

## 2019-10-12 DIAGNOSIS — I493 Ventricular premature depolarization: Secondary | ICD-10-CM

## 2019-10-12 DIAGNOSIS — I712 Thoracic aortic aneurysm, without rupture, unspecified: Secondary | ICD-10-CM

## 2019-10-12 DIAGNOSIS — I517 Cardiomegaly: Secondary | ICD-10-CM

## 2019-10-12 DIAGNOSIS — I10 Essential (primary) hypertension: Secondary | ICD-10-CM

## 2019-10-12 DIAGNOSIS — I288 Other diseases of pulmonary vessels: Secondary | ICD-10-CM

## 2019-10-12 MED ORDER — LOSARTAN POTASSIUM 100 MG PO TABS: 50.0000 mg | ORAL_TABLET | Freq: Every day | ORAL | 3 refills | Status: DC

## 2019-10-12 NOTE — Patient Instructions (Signed)
Medication Instructions:  Your physician has recommended you make the following change in your medication:  1.  START Losartan 100 mg taking ONLY 1/2 TABLET DAILY.. STARTING TODAY ASAP  *If you need a refill on your cardiac medications before your next appointment, please call your pharmacy*  Lab Work: 10/15/2019 AT YOUR WORK:  BMET If you have labs (blood work) drawn today and your tests are completely normal, you will receive your results only by: Marland Kitchen MyChart Message (if you have MyChart) OR . A paper copy in the mail If you have any lab test that is abnormal or we need to change your treatment, we will call you to review the results.  Testing/Procedures: Your physician has requested that you have an echocardiogram 10-19-2019 ARRIVE AT 3:50 P.M. FOR THIS.  Echocardiography is a painless test that uses sound waves to create images of your heart. It provides your doctor with information about the size and shape of your heart and how well your heart's chambers and valves are working. This procedure takes approximately one hour. There are no restrictions for this procedure.    Follow-Up: At [facility], you and your health needs are our priority.  As part of our continuing mission to provide you with exceptional heart care, we have created designated Provider Care Teams.  These Care Teams include your primary Cardiologist (physician) and Advanced Practice Providers (APPs -  Physician Assistants and Nurse Practitioners) who all work together to provide you with the care you need, when you need it.  Your next appointment:   10/26/2019   The format for your next appointment:   Virtual Visit   Provider:   Melina Copa, PA-C  Other Instructions Continue to follow your blood pressure as you have been.

## 2019-10-12 NOTE — Progress Notes (Signed)
Noted patient to have BMET 5 Nov and pending renal c/s/US.  Restarting atenolol 1/2 tab 50mg  per cardiology and holding amlodipine at current dose.

## 2019-10-12 NOTE — Telephone Encounter (Signed)
Notes recorded by Frederik Schmidt, RN on 10/12/2019 at 9:29 AM EST  The patient has been notified of the result and verbalized understanding. All questions (if any) were answered.  Frederik Schmidt, RN 10/12/2019 9:29 AM

## 2019-10-12 NOTE — Telephone Encounter (Signed)
Reviewed at ov

## 2019-10-12 NOTE — Telephone Encounter (Signed)
-----   Message from Sueanne Margarita, MD sent at 10/09/2019  7:55 PM EDT ----- Chest CTA showed stable mild ascending thoracic aortic aneurysm at 23mm unchanged from 2017.  Repeat CTA in 1 year  Please repeat echo to followup on Pulmonary artery enlargement on CT and enlarged LV

## 2019-10-13 ENCOUNTER — Ambulatory Visit: Payer: PRIVATE HEALTH INSURANCE | Admitting: Physician Assistant

## 2019-10-13 NOTE — Progress Notes (Signed)
BMET scheduled for 10/15/19 in clinic. Appt emailed to pt.

## 2019-10-14 NOTE — Progress Notes (Signed)
Late entry; spoke with patient 10/13/2019 at 1215 at his workcenter desk.  He was on break eating snack.  Reported still getting headache in the afternoons and BP still elevated at work and home and had restarted blood pressure medication 1/2 tab per PA Dunn and keeping BP log but hadn't taken his blood pressure this morning. He noticed some lower eyelid swelling and wondering if it is blood pressure medication or something else causing this.  I discussed that if allergies not controlled/sinusitis can also cause lower eyelid swelling.  Patient has been reporting post nasal drip even though he is using flonase and singulair.  Not showering after work every day.  Encouraged patient to use nasal saline and shower after work/prior to bed. He is concerned cardiology going to restart his lasix again and it makes him pee all the time which he doesn't like.  He denied chest pain/vision changes or worst headache of his life.  Wondering if he was going to do heart echo.  I encouraged him to do so.  Patient concerned about missing work.  Discussed with patient they could probably coordinate his echo/office visit if he discussed with staff at cardiology clinic.  Patient stated he would call and follow up with RN [name] for BMET draw also.  Patient verbalized understanding information/instructions, agreed with plan of care and had no further questions at this time.

## 2019-10-14 NOTE — Progress Notes (Signed)
noted 

## 2019-10-15 ENCOUNTER — Ambulatory Visit: Payer: Self-pay | Admitting: *Deleted

## 2019-10-15 ENCOUNTER — Other Ambulatory Visit: Payer: Self-pay

## 2019-10-15 VITALS — BP 147/106 | HR 74

## 2019-10-15 DIAGNOSIS — I10 Essential (primary) hypertension: Secondary | ICD-10-CM

## 2019-10-15 DIAGNOSIS — Z79899 Other long term (current) drug therapy: Secondary | ICD-10-CM

## 2019-10-15 NOTE — Progress Notes (Signed)
BMET per cardiology. Will route results to cardiology PA.

## 2019-10-16 ENCOUNTER — Telehealth: Payer: Self-pay

## 2019-10-16 LAB — BASIC METABOLIC PANEL
BUN/Creatinine Ratio: 15 (ref 10–24)
BUN: 18 mg/dL (ref 8–27)
CO2: 22 mmol/L (ref 20–29)
Calcium: 9.9 mg/dL (ref 8.6–10.2)
Chloride: 106 mmol/L (ref 96–106)
Creatinine, Ser: 1.18 mg/dL (ref 0.76–1.27)
GFR calc Af Amer: 75 mL/min/{1.73_m2} (ref >59)
GFR calc non Af Amer: 65 mL/min/{1.73_m2} (ref >59)
Glucose: 84 mg/dL (ref 65–99)
Potassium: 4 mmol/L (ref 3.5–5.2)
Sodium: 142 mmol/L (ref 134–144)

## 2019-10-16 MED ORDER — LOSARTAN POTASSIUM 100 MG PO TABS: 100.0000 mg | ORAL_TABLET | Freq: Every day | ORAL | 3 refills | Status: DC

## 2019-10-16 NOTE — Telephone Encounter (Signed)
-----   Message from Dayna N Dunn, PA-C sent at 10/16/2019  2:00 PM EST ----- Sending to triage because Jennifer and I are both off. Please let pt know BMET is totally stable. If his BP has still been elevated with resumption of losartan (over 130/80) and tolerating resumption of medicine OK, would go ahead and increase all the way to 1 whole tablet (100mg) daily. Repeat BMET in 1 week and f/u as planned. 

## 2019-10-16 NOTE — Progress Notes (Signed)
Reviewed results with pt over phone. All WNL. Advised pt to keep echo appt and virtual f/u. He verbalizes understanding and agreement. No further questions.

## 2019-10-16 NOTE — Telephone Encounter (Signed)
Notes recorded by Frederik Schmidt, RN on 10/16/2019 at 2:17 PM EST  The patient has been notified of the result and verbalized understanding. All questions (if any) were answered.  Frederik Schmidt, RN 10/16/2019 2:17 PM

## 2019-10-16 NOTE — Telephone Encounter (Signed)
-----   Message from Charlie Pitter, [place] sent at 10/16/2019  2:00 PM EST ----- Sending to triage because Anderson Malta and I are both off. Please let pt know BMET is totally stable. If his BP has still been elevated with resumption of losartan (over 130/80) and tolerating resumption of medicine OK, would go ahead and increase all the way to 1 whole tablet (100mg ) daily. Repeat BMET in 1 week and f/u as planned.

## 2019-10-19 ENCOUNTER — Encounter: Payer: Self-pay | Admitting: Cardiology

## 2019-10-19 ENCOUNTER — Other Ambulatory Visit: Payer: Self-pay

## 2019-10-19 ENCOUNTER — Ambulatory Visit (HOSPITAL_COMMUNITY): Payer: PRIVATE HEALTH INSURANCE | Attending: Internal Medicine

## 2019-10-19 DIAGNOSIS — I517 Cardiomegaly: Secondary | ICD-10-CM | POA: Diagnosis present

## 2019-10-19 DIAGNOSIS — I712 Thoracic aortic aneurysm, without rupture: Secondary | ICD-10-CM | POA: Insufficient documentation

## 2019-10-19 DIAGNOSIS — I288 Other diseases of pulmonary vessels: Secondary | ICD-10-CM | POA: Diagnosis not present

## 2019-10-20 ENCOUNTER — Encounter: Payer: Self-pay | Admitting: Registered Nurse

## 2019-10-20 ENCOUNTER — Telehealth: Payer: Self-pay | Admitting: Registered Nurse

## 2019-10-20 NOTE — Telephone Encounter (Signed)
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  He stated headache no longer occurring in the afternoon with blood pressure medications changes but he is feeling a little tired.  BP still somewhat elevated but a little better per patient.  He didn't have log with him.  He brought in his wrist BP cuff and discussed with him to bring to clinic and RN [name] will take his BP with our machine and his to compare numbers.  Patient reported he is fixing his bicycle at home so he can start exercising that way this week.  He has lab draw scheduled Friday with RN [name] per cardiology for appt scheduled Monday 16 Nov due to increase in his losartan dose.  Nephrology c/s appt pending  Rheumatology scheduled Feb 2021.  Patient was happy his echocardiogram did not show worsening of function.  Discussed with well controlled blood pressure, exercise and healthy diet it could be possible to improve results next year.  Patient had no further questions at this time and verbalized understanding of information/instructions.

## 2019-10-20 NOTE — Progress Notes (Unsigned)
The patient has been notified of the result and verbalized understanding.  All questions (if any) were answered.     

## 2019-10-23 ENCOUNTER — Telehealth: Payer: Self-pay

## 2019-10-23 ENCOUNTER — Other Ambulatory Visit: Payer: Self-pay

## 2019-10-23 ENCOUNTER — Ambulatory Visit: Payer: Self-pay | Admitting: *Deleted

## 2019-10-23 VITALS — BP 159/101 | HR 72

## 2019-10-23 DIAGNOSIS — Z79899 Other long term (current) drug therapy: Secondary | ICD-10-CM

## 2019-10-23 DIAGNOSIS — I10 Essential (primary) hypertension: Secondary | ICD-10-CM

## 2019-10-23 MED ORDER — CHLORTHALIDONE 25 MG PO TABS: 25.0000 mg | ORAL_TABLET | Freq: Every day | ORAL | 3 refills | Status: DC

## 2019-10-23 MED ORDER — NEBIVOLOL HCL 10 MG PO TABS: 10.0000 mg | ORAL_TABLET | Freq: Every day | ORAL | 3 refills | Status: DC

## 2019-10-23 NOTE — Telephone Encounter (Signed)
Left message for patient to call back  

## 2019-10-23 NOTE — Telephone Encounter (Signed)
Error

## 2019-10-23 NOTE — Telephone Encounter (Signed)
As of 11/5 OV, the plan was to get a BMET in 1 week - can you please verify this will be done? 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 Please also clarify rx on his chlorthalidone on med list - I think he is taking 1 whole tablet daily now, just want to be sure so we can accurately reflect this on this list. Let's go up on his Bystolic to 10mg  daily and continue to follow BP like he is doing. Ashwini Jago PA-C

## 2019-10-23 NOTE — Telephone Encounter (Signed)
Good morning, Ryan Glass is actually in clinic with me now. He said he missed your phone call and requested I follow up with you since he is here.   We are drawing the BMET right now. I'l CC Dayna on the results, which will be back in the morning.  He started 1 full tablet 25mg  Chlorthalidone on Day 4 of restarting it. I've made a note on his med list.   I've also informed him of the Bystolic increase to 10mg  daily. He thinks he will have appox 2 weeks supply at home if he doubles up. New Rx to [employer] [facility]	[place] would likely be helpful for him to have available.   He will continue checking BP daily and will upload a new log before each new appt.   Please let me know if there is anything else I can do for you and [facility]. Thanks! Have a great day.       Documentation    Patient was in clinic with Cheri Guppy, RN (see above message). I have sent in an order for new prescription of Bystolic.

## 2019-10-23 NOTE — Telephone Encounter (Signed)
Good morning, Ryan Glass is actually in clinic with me now. He said he missed your phone call and requested I follow up with you since he is here.   We are drawing the BMET right now. I'l CC Dayna on the results, which will be back in the morning.  He started 1 full tablet 25mg  Chlorthalidone on Day 4 of restarting it. I've made a note on his med list.   I've also informed him of the Bystolic increase to 10mg  daily. He thinks he will have appox 2 weeks supply at home if he doubles up. New Rx to [employer] [facility]	[place] would likely be helpful for him to have available.   He will continue checking BP daily and will upload a new log before each new appt.   Please let me know if there is anything else I can do for you and [facility]. Thanks! Have a great day.

## 2019-10-23 NOTE — Telephone Encounter (Signed)
LMTCB 11/13

## 2019-10-23 NOTE — Telephone Encounter (Signed)
Thank you all! Such great teamwork. Appreciative of your help.

## 2019-10-23 NOTE — Progress Notes (Signed)
BMET drawn today. Pt uploaded BP log in MyChart. Cardiology PA recommends increasing Bystolic to 10mg  daily. Pt made aware and confirms he has a couple of weeks supply at home if he doubles dose. Also confirmed he is taking Chlorthalidone 25mg  daily. Pt remains asymptomatic with HTN. Denies palpitations, chest pain, HA, vision changes, etc.

## 2019-10-24 LAB — BASIC METABOLIC PANEL
BUN/Creatinine Ratio: 15 (ref 10–24)
BUN: 18 mg/dL (ref 8–27)
CO2: 19 mmol/L — ABNORMAL LOW (ref 20–29)
Calcium: 9.2 mg/dL (ref 8.6–10.2)
Chloride: 100 mmol/L (ref 96–106)
Creatinine, Ser: 1.2 mg/dL (ref 0.76–1.27)
GFR calc Af Amer: 74 mL/min/{1.73_m2} (ref >59)
GFR calc non Af Amer: 64 mL/min/{1.73_m2} (ref >59)
Glucose: 84 mg/dL (ref 65–99)
Potassium: 3.7 mmol/L (ref 3.5–5.2)
Sodium: 138 mmol/L (ref 134–144)

## 2019-10-25 NOTE — Progress Notes (Signed)
Virtual Visit via Video Note   This visit type was conducted due to national recommendations for restrictions regarding the COVID-19 Pandemic (e.g. social distancing) in an effort to limit this patient's exposure and mitigate transmission in our community.  Due to his co-morbid illnesses, this patient is at least at moderate risk for complications without adequate follow up.  This format is felt to be most appropriate for this patient at this time.  All issues noted in this document were discussed and addressed.  A limited physical exam was performed with this format.  Please refer to the patient's chart for his consent to telehealth for [facility] - verbally consented at last in-office visit.  Date:  10/26/2019   ID:  Ryan Glass, DOB May 31, 1956, MRN [id number]  Patient Location: Car Provider Location: Home  PCP:  [name], MD  Cardiologist:  [name], MD  Electrophysiologist:  [name], MD   Evaluation Performed:  Follow-Up Visit  Chief Complaint: f/u BP  History of Present Illness:    Ryan Glass is a 63 y.o. 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  He was remotely followed by Dr. Lady Gary then established care with Dr. Mayford Knife in 03/2018 for episodic atypical chest pain and frequent PVCs. 2D echo 04/2018 showed EF 60-65%, grade 2 DD, moderately dilated aortic root, trileaflet AV, mildly dilated ascending aorta, moderately dilated LA, mildly dilated RV. Nuclear stress test 05/2018 was normal. 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 He was referred to Dr. Ladona Ridgel who felt that the PVCs were originating to the lateral wall of the inferior apex of the RV. The patient underwent successful PVCablation in November 2019 and initially did well. Atenolol was weaned off.   When seen in follow-up 01/2019 his blood pressure was elevated. He was having a lot of edema on amlodipine so was changed to [employer]. He had not been compliant with CPAP which was felt to be contributing (he has since been compliant). He was seen by DOD Dr. Tenny Craw in 07/2019 as his HR was low in the 30s on home BP monitor. He came to the office and was found to have frequent PVCs (likely not being picked up on BP monitor). Dr. Tenny Craw reviewed with Dr. Ladona Ridgel. The PVCs appeared to be coming from high in the LV, different foci than previous. 2-day event monitor showed min HR of 41 bpm, max HR of 146 bpm, and avg HR of 62, predominant rhythm of NSR with first degree AVB, frequent PVCs (8.2%), bundle branch block present, 1 run SVT lasting 5 beats, rare PACs, occasional ventricular bigeminy and trigeminy. He saw Francis Dowse in follow-up with EP 08/13/19 who stated that PVCs by her review appeared to be predominantly of a single morphology though did have another; he also had SR with and without BBB. [name] reviewed with Dr. Ladona Ridgel who felt there was no indication to repeat ablation. She increased Bystolic to 10mg  daily and recommended f/u with Dr. . His blood pressure was normal at that time. Of note, he also had a normal renal artery duplex 04/2019. On his renin/aldo result, it states "Normal  urinary catecholamines but PRA elevated with normal aldosterone - see telephone note concerning referral to nephrology" but I could not find a corresponding telephone note.  On 08/26/19 the patient called the answering service indicating low pulse rate and blood pressure of 106/56 so he'd spoken to the on-call provider who recommended to stop all BP medications and only take  hydralazine PRN SBP>140. (Amlodipine, Lasix, losartan and Bystolic were stopped. Of note, his antihypertensive regimen had been otherwise a bit tricky to follow. He appears to previously have been on losartan-HCTZ, then chlorthalidone, then losartan on its own without diuretic. At one point over the summer his creatinine got up to 1.6 but do not see that was a time when diuretic was specifically stopped.) F/u EKG in the office to confirm HR actually showed NSR with bigeminal PVCs again, likely accounting for the "low heart rate" on home monitor. After cessation of his antihypertensive regimen, his blood pressure naturally began to increase. Therefore, he has been seen over the last few weeks with gradual re-introduction of his regimen. He is currently on amlodipine 5mg  daily, chlorthalidone 25mg  daily, hydralazine 50mg  TID, losartan 100mg  daily and Bystolic 10mg  daily. His workplace health team is also helping to coordinate his blood pressure and lab checks.  Follow-up surveillance CTA 10/09/19 showed stable 33mm mild fusiform aneurysmal dilatation of the ascending thoracic aorta, mild enlargement of pulmonary artery and trace pericardial effusion, stable ectasia of celiac trunk, aortic atherosclerosis. Dr. [name] recommended repeat echo to f/u enlarged pulmonary artery and cardiomegaly. This showed normal LVEF 60-65%, grade 1 DD, mild MR, aortic root unchanged from prior echo (34mm), no significant pulmonary HTN reported on study. BMET 11/13 showed K 3.7, Cr 1.20 (stable), otherwise last labs 09/2019 showed LDL 49, normal ALT, K 4.1, Cr 1.23, 02/2019 TSH, 09/2018 CBC wnl.  He is seen virtually for follow-up today overall feeling well with recent beta blocker titration. SBP still running 150s but he is feeling well. No recurrent low HR signals from his monitor. No acute complaints otherwise. As always, he is very engaged in his care and asks excellent questions. He received pre-appointment paperwork from  nephrology and will be seeing them soon.  The patient does not have symptoms concerning for COVID-19 infection (fever, chills, cough, or new shortness of breath).    Past Medical History:  Diagnosis Date   Ascending aortic aneurysm ([place])    26mm by echo and 42mm by Chest CTA 10/2019   CKD (chronic kidney disease), stage II    Hyperlipidemia LDL goal <70    Hypertension    Lower extremity edema    OSA (obstructive sleep apnea)    PVC's (premature ventricular contractions)    Status post PVC ablation by [name] 2019 with recurrence of frequent PVCs/bigeminy   Splenomegaly    a. mild noted by prior CT.   Thoracic aortic aneurysm ([facility])    46 mm aortic root and 39 mm ascending aorta by echo 2019.  Chest CT 05/2018 showed 4.3 cm thoracic aorta.   Umbilical hernia    Past Surgical History:  Procedure Laterality Date   PVC ABLATION N/A 10/07/2018   Procedure: PVC ABLATION;  Surgeon: Evans Lance, MD;  Location: [facility];  Service: Cardiovascular;  Laterality: N/A;     Current Meds  Medication Sig   acetaminophen-codeine (TYLENOL #3) 300-30 MG tablet 1-2 tablets as needed.    amLODipine (NORVASC) 5 MG tablet Take 1 tablet (5 mg total) by mouth daily.   atorvastatin (  LIPITOR) 20 MG tablet TAKE ONE TABLET BY MOUTH DAILY   chlorthalidone (HYGROTON) 25 MG tablet Take 1 tablet (25 mg total) by mouth daily.   colchicine 0.6 MG tablet Take 1-2 tablets by mouth daily as needed (gout).    diclofenac (CATAFLAM) 50 MG tablet Take 50 mg by mouth daily.    fluticasone (FLONASE) 50 MCG/ACT nasal spray INSTILL 1 SPRAY INTO BOTH NOSTRILS TWO TIMES A DAY   gabapentin (NEURONTIN) 600 MG tablet Take 600 mg by mouth 3 (three) times daily.    hydrALAZINE (APRESOLINE) 25 MG tablet Take 2 tablets (50 mg total) by mouth 3 (three) times daily.   hydroxychloroquine (PLAQUENIL) 200 MG tablet Take by mouth daily.    losartan (COZAAR) 100 MG tablet Take 1 tablet (100 mg total)  by mouth daily.   montelukast (SINGULAIR) 10 MG tablet TAKE ONE TABLET BY MOUTH EVERY NIGHT AT BEDTIME   nebivolol (BYSTOLIC) 10 MG tablet Take 1 tablet (10 mg total) by mouth daily.   potassium chloride SA (KLOR-CON) 20 MEQ tablet Take 40 mEq by mouth 2 (two) times daily. 2 tablets BID     Allergies:   Amlodipine besylate and Aspirin   Social History   Tobacco Use   Smoking status: Never Smoker   Smokeless tobacco: Never Used  Substance Use Topics   Alcohol use: Yes   Drug use: Never     Family Hx: The patient's family history includes Cardiomyopathy in his mother; Heart attack in his father.  ROS:   Please see the history of present illness.    All other systems reviewed and are negative.   Prior CV studies:    Most recent pertinent cardiac studies are outlined above.  Labs/Other Tests and Data Reviewed:    EKG: An ECG dated 09/24/19 was personally reviewed today and demonstrated:  sinus bradycardia 59bpm, 1st degree AVB, rightward axis, one PVC, QTc 445ms, otherwise no acute changes  Recent Labs: 02/26/2019: TSH 2.260 07/23/2019: Magnesium 2.0 09/24/2019: ALT 21 10/23/2019: BUN 18; Creatinine, Ser 1.20; Potassium 3.7; Sodium 138   Recent Lipid Panel Lab Results  Component Value Date/Time   CHOL 104 09/24/2019 11:02 AM   TRIG 98 09/24/2019 11:02 AM   HDL 36 (L) 09/24/2019 11:02 AM   CHOLHDL 2.9 09/24/2019 11:02 AM   LDLCALC 49 09/24/2019 11:02 AM    Wt Readings from Last 3 Encounters:  10/26/19 236 lb (107 kg)  10/12/19 242 lb (109.8 kg)  09/24/19 246 lb 1.9 oz (111.6 kg)     Objective:    Vital Signs:  BP (!) 151/83    Pulse 68    Ht 5\' 8"  (1.727 m)    Wt 236 lb (107 kg)    BMI 35.88 kg/m    VS reviewed. General - pleasant Hispanic M in no acute distress HEENT - NCAT, EOM intact Pulm - No labored breathing, no coughing during visit, no audible wheezing, speaking in full sentences Neuro - A+Ox3, no slurred speech, answers questions  appropriately Psych - Pleasant affect   ASSESSMENT & PLAN:    1. Essential HTN - blood pressure remains elevated despite multiple medication adjustments. He did not tolerate higher doses of amlodipine due to edema. He has mild edema on current dose but states it's tolerable. Will titrate hydralazine to 100mg  TID. Keep f/u with renal to see if they have any insight given his prior abnormal renin/aldo ratio. Could consider spironolactone as next step in his regimen although he's been less thrilled to take  diuretics. He remains very engaged in his care and will continue to relay updated BP readings via MyChart and with communication through his workplace. 2. TAA and enlarged pulmonary artery - echo reviewed as above. TAA stable. Reviewed with patient today. I previously gave him information on aneurysms in person at last in-person visit. [name] recommended repeat CTA in 1 year (09/2020). Given his young age, it might make more sense to do an MRA to limit radiation exposure. [name] also recommended f/u echo in 10/2020 but it is not clear to me that he would require both.  We can verify arrangement of this study at next OV closer to when it is due. 3. PVCs - no recent low HRs on monitor, which would suggest that PVCs have decreased in burden (he was having pseudobradycardia before, likely from bigeminy). Continue beta blocker as tolerated.  See prior OV from 09/24/19 for comprehensive assessment of other cardiology issues.   COVID-19 Education: Discussed with patient at prior visit so did not revisit today.  Time:   Today, I have spent 15 minutes with the patient with telehealth technology discussing the above problems.     Medication Adjustments/Labs and Tests Ordered: Current medicines are reviewed at length with the patient today.  Concerns regarding medicines are outlined above.   Disposition:  Follow up in 3 months with [name] or myself, OK to be virtual.  Signed, Laurann Montanaayna N Toiya Morrish,  PA-C  10/26/2019 3:33 PM     [facility]

## 2019-10-26 ENCOUNTER — Other Ambulatory Visit: Payer: Self-pay

## 2019-10-26 ENCOUNTER — Telehealth: Payer: Self-pay

## 2019-10-26 ENCOUNTER — Encounter: Payer: Self-pay | Admitting: Physician Assistant

## 2019-10-26 ENCOUNTER — Telehealth (INDEPENDENT_AMBULATORY_CARE_PROVIDER_SITE_OTHER): Payer: PRIVATE HEALTH INSURANCE | Admitting: Physician Assistant

## 2019-10-26 VITALS — BP 151/83 | HR 68 | Ht 68.0 in | Wt 236.0 lb

## 2019-10-26 DIAGNOSIS — I712 Thoracic aortic aneurysm, without rupture, unspecified: Secondary | ICD-10-CM

## 2019-10-26 DIAGNOSIS — I10 Essential (primary) hypertension: Secondary | ICD-10-CM | POA: Diagnosis not present

## 2019-10-26 DIAGNOSIS — I493 Ventricular premature depolarization: Secondary | ICD-10-CM

## 2019-10-26 MED ORDER — HYDRALAZINE HCL 100 MG PO TABS: 100.0000 mg | ORAL_TABLET | Freq: Three times a day (TID) | ORAL | 3 refills | Status: DC

## 2019-10-26 NOTE — Patient Instructions (Addendum)
Medication Instructions:  Your physician has recommended you make the following change in your medication: increase hydralazine to 100 mg by mouth three times daily  *If you need a refill on your cardiac medications before your next appointment, please call your pharmacy*  Lab Work: none If you have labs (blood work) drawn today and your tests are completely normal, you will receive your results only by: Marland Kitchen MyChart Message (if you have MyChart) OR . A paper copy in the mail If you have any lab test that is abnormal or we need to change your treatment, we will call you to review the results.  Testing/Procedures: none  Follow-Up: At [facility], you and your health needs are our priority.  As part of our continuing mission to provide you with exceptional heart care, we have created designated Provider Care Teams.  These Care Teams include your primary Cardiologist (physician) and Advanced Practice Providers (APPs -  Physician Assistants and Nurse Practitioners) who all work together to provide you with the care you need, when you need it.  Your next appointment:   3 months  The format for your next appointment:   Virtual Visit   Provider:   Melina Copa, PA  Other Instructions Continue to check blood pressure and send [place] weekly readings

## 2019-10-26 NOTE — Telephone Encounter (Signed)
-----   Message from Charlie Pitter, PA-C sent at 10/25/2019  8:57 AM EST ----- BMET stable. Continue plan as discussed

## 2019-10-26 NOTE — Telephone Encounter (Signed)
Notes recorded by Frederik Schmidt, RN on 10/26/2019 at 8:26 AM EST  Lpm with results/recommendations 11/16  ------

## 2019-10-27 NOTE — Progress Notes (Signed)
Noted hydralazine 100mg  po TID now and follow up with cardiology in 3 months.  Please contact patient and ensure he is aware and schedule BP follow up with him with his monitor to check against the clinic machine.

## 2019-10-29 NOTE — Progress Notes (Signed)
Good morning. I see that Lisbeth Renshaw is out of the office today. We just spoke with [facility] at work and he reported that he went to pick up his Bystolic refill and had no copay with the 5mg  tabs, but was told he needed to pay >$200 for the 10mg  tabs. Would you please enter a new prescription for him for Bystolic 5mg  #180. This way he also can titrate them as needed as he has had BLE edema with the increased 10mg  dose before. He does report feeling better with the increased Hydralazine dosing. He has been getting readings at home of 130's/80's. Please let me know if you need anything else for [facility]. Thanks for your help!

## 2019-10-29 NOTE — Progress Notes (Signed)
Noted and I visited patient in his workcenter this morning.  Patient feeling much better with new medication regimen and started using a different disposable face mask that fits his face better lower eyelid swelling resolved.

## 2019-10-30 MED ORDER — NEBIVOLOL HCL 5 MG PO TABS: 10.0000 mg | ORAL_TABLET | Freq: Every day | ORAL | 3 refills | Status: DC

## 2019-10-30 NOTE — Addendum Note (Signed)
Addended by: Drue Novel I on: 10/30/2019 12:54 PM   Modules accepted: Orders

## 2019-10-30 NOTE — Progress Notes (Signed)
Triage, can you assist with sending in the Bystolic rx to get 5mg  tabs instead of 10mg  tabs? Should still continue a total of 10mg  daily.    Ryan Glass, just wanted to clarify - Bystolic does not typically cause edema, but amlodipine does - this is why we have only kept him on the 5mg  dose of this.  Thank you again for everyone's help with Ryan Glass. It truly has been a team effort! Ryan Glass

## 2019-10-30 NOTE — Progress Notes (Signed)
noted 

## 2019-10-30 NOTE — Progress Notes (Signed)
Called and spoke to patient. Made him aware that we will send in for bystolic 5 mg tablet. Instructed for patient to take 2 tablets QD for a total of 10 mg QD. Rx sent to preferred pharmacy.

## 2019-10-30 NOTE — Progress Notes (Signed)
Dayna, You are completely correct. My apologies. Thank you for catching that. The cost is Toshiyuki's concern with the 10mg . No idea why insurance makes things this difficult, but it's what they do. Thank you!

## 2019-11-03 ENCOUNTER — Telehealth: Payer: Self-pay | Admitting: Registered Nurse

## 2019-11-03 NOTE — Telephone Encounter (Signed)
Thanks Otila Kluver! Given this specific type of fatigue, I wonder if his CPAP device needs to be looked at as this sounds like it could be related to his OSA. I know lack of use was an issue earlier this year, which was felt to be impacting his BP swings. He more recently reporting using it. I will forward to Gae Bon with our sleep team to discuss with Dr. [name] if there's anything they would do to optimize his settings. Thanks, [employer]

## 2019-11-03 NOTE — Telephone Encounter (Signed)
Patient reported he uploaded his blood pressure log to cardiology yesterday and has been feeling good.  He has noticed he will fall asleep within 5 minutes if sitting in a chair at work or home so he is working standing up at work today at a standing work table.  Discussed with patient sometimes the body needs time to adjust to the new lower blood pressure but to definitely notify PA Dayna if his tiredness/falling asleep during the day not improving or worsening.  Patient to continue his blood pressure log and upload again next week per cardiology.  Patient verbalized understanding information/instructions, agreed with plan of care and had no further questions at this time.

## 2019-11-03 NOTE — Telephone Encounter (Signed)
Noted I will follow up with patient next week on his CPAP compliance also.  [facility] closed until 30 Nov.

## 2019-11-04 NOTE — Telephone Encounter (Signed)
Gae Bon please get a download

## 2019-11-10 ENCOUNTER — Other Ambulatory Visit: Payer: Self-pay | Admitting: Nephrology

## 2019-11-10 NOTE — Telephone Encounter (Signed)
Patient is in air view  Ryan Glass, Ryan Glass 10/11/2019 - 11/09/2019 Patient ID: [id number] DOB: 03/02/1956 Age: 63 years Gender: Male 60.3 Ryan Glass, Ryan Glass Compliance Report Usage 10/11/2019 - 11/09/2019 Usage days 27/30 days (90%) >= 4 hours 27 days (90%) < 4 hours 0 days (0%) Usage hours 150 hours 12 minutes Average usage (total days) 5 hours 0 minutes Average usage (days used) 5 hours 34 minutes Median usage (days used) 5 hours 35 minutes Total used hours (value since last reset - 11/09/2019) 1,190 hours AirSense 10 AutoSet Serial number [id number] Mode AutoSet Min Pressure 8 cmH2O Max Pressure 20 cmH2O EPR Off EPR level 3 Response Standard Therapy Pressure - cmH2O Median: 10.7 95th percentile: 14.3 Maximum: 15.8 Leaks - L/min Median: 27.2 95th percentile: 60.8 Maximum: 99.9 Events per hour AI: 2.0 HI: 3.3 AHI: 5.3 Apnea Index Central: 0.1 Obstructive: 0.2 Unknown: 1.7 RERA Index 2.0 Cheyne-Stokes respiration (average duration per night) 0 minutes (0%) Usage - hours Printed

## 2019-11-10 NOTE — Telephone Encounter (Signed)
Ryan Glass his AHI is 5/hr on auto PAP and he is very compliant with his device

## 2019-11-11 NOTE — Telephone Encounter (Signed)
Some of these BP are outside our ideal reference range.  We have 2 options at this point: 1)  If swelling has not been as much of a problem lately, can try increasing amlodipine back to 10mg  daily. He had edema previously when on this dose, but it's worth retrying - maybe split up into 5mg  BID to see if that reduces edema reaccumulation. I would not yet send in a 10mg  dose yet, but want to see how he does.  Versus option 2-   2) addition of spironolactone 12.5mg  daily. If he did this, I would have him decrease his KCl to 2 tablets (21meq) once daily, with recheck BMET 5-7 days. This is a diuretic which he has been less thrilled about in the past, but does not usually produce as brisk response with urination as Lasix does.  Hannalee Castor PA-C

## 2019-11-11 NOTE — Telephone Encounter (Addendum)
Thank you all 

## 2019-11-12 ENCOUNTER — Other Ambulatory Visit: Payer: Self-pay | Admitting: Cardiology

## 2019-11-12 MED ORDER — AMLODIPINE BESYLATE 5 MG PO TABS: 5.0000 mg | ORAL_TABLET | Freq: Two times a day (BID) | ORAL | 3 refills | Status: DC

## 2019-11-12 MED ORDER — ATORVASTATIN CALCIUM 20 MG PO TABS: 20.0000 mg | ORAL_TABLET | Freq: Every day | ORAL | 3 refills | Status: DC

## 2019-11-12 NOTE — Telephone Encounter (Signed)
Thank you for the great team work!

## 2019-11-12 NOTE — Telephone Encounter (Signed)
Pt called in response to his mychart message re: blood pressure. Pt will try the Amlodipine 5 mg bid. Pt was advised to look for swelling, since he had that reaction in the past with 10 mg tablet. Pt will continue to monitor his bp and send in readings to mychart.

## 2019-11-12 NOTE — Telephone Encounter (Signed)
Noted patient reports he typically falls asleep on the couch for 2 hours before waking up and getting himself to bedroom to sleep and that is why CPAP showing less than 6 hours use per night.  Discussed with patient 5 hours of sleep per night chronically not recommended and to go to sleep so he can rest 7 hours before he has to get up for work otherwise he could be sleep deprived leading to fatigue/jet lag symptoms.  Patient also showed me his BP log from the past week that was sent to [facility] and asked what his BP goal is as he thought 140s/90s with a few 130s/80s was very good.  Discussed with patient 130s/70s most blood pressure readings the goal at this time as we don't want him symptomatic with brain fog/fatigue/dizzyness/postural hypotension either.  I discussed for healthy young adult goal resting BP less than 120/70. Patient also told me he was going to try the split dosing amlodipine 5mg  po BID first to see if he can get to BP goal and if swelling starts again he will then give the spironolactone a trial but he doesn't want to take anything that makes him pee more if he doesn't have to.  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  I reassured him it was due to the cold weather outside as I was cold also standing in his dept but if he has new always feeling cold 24/7 home and work he should notify PCM/cardiology.  Otherwise patient feeling well today not having palpitations/headache/fatigue/swelling ankles/legs.  Patient verbalized understanding information and had no further questions at this time.

## 2019-11-27 NOTE — Telephone Encounter (Signed)
Call placed to pt re: mychart message re: blood pressure and heart rate. Explained message from [employer], PA-C below.  Pt advised that he wasn't really concerned with his heart rate, cause he figured he was skipping a beat, but he was more concerned with his bp.  I advised him that Dayna wasn't too concerned about that since his systolic # was above 836. I discussed with pt the Kardia/Alive Cor monitor to help with accurate heart rate readings and be able to send [place] tracings from his phone. Pt did state he will look into that.  As for bp, pt was advised to continue to monitor it.  If he sees the systolic # drop under 629 to let us know and at time may consider reducing the Bystolic, but for now, we will leave things the way they were. Pt verbalized understanding and was very grateful for the call.

## 2019-11-27 NOTE — Telephone Encounter (Signed)
Please call patient to see how he is feeling with these readings. Feel free to send him this mychart message afterwards - but with the HR readings, just wanted him to have verbal contact.  I'm not too worried about these blood pressure readings since all are above 948 systolic, but we can try decreasing Bystolic back to 5mg  daily. This would also help decreased heart rate. The tricky part about this, though, is that I'm not sure that his heart rate is truly in the 30s-40s. He has a history of frequent PVCs, which do NOT register on a monitor when checking his heart rate. So if he is having a lot of PVCs, his heart rate will show up about half as low as it actually is running. This is what prompted him to see Dr. [name] earlier this year - his home monitor was reading in the 30's but when he came in to the office his heart rate was actually normal, it's just that the PVCs were not registering. I would suggest he look into getting a Kardia/Alive Cor monitor to be able to send [place] a tracing from his phone when this is happening. This will allow [place] to make a quicker more appropriate decision without having to constantly repeat his wearable monitor.  Ryan Glass

## 2019-11-30 ENCOUNTER — Other Ambulatory Visit: Payer: Self-pay | Admitting: Cardiology

## 2019-12-03 ENCOUNTER — Ambulatory Visit: Payer: Self-pay | Admitting: Registered Nurse

## 2019-12-03 ENCOUNTER — Other Ambulatory Visit: Payer: Self-pay

## 2019-12-03 ENCOUNTER — Encounter: Payer: Self-pay | Admitting: Registered Nurse

## 2019-12-03 VITALS — BP 131/76 | HR 63

## 2019-12-03 DIAGNOSIS — R002 Palpitations: Secondary | ICD-10-CM

## 2019-12-03 DIAGNOSIS — J019 Acute sinusitis, unspecified: Secondary | ICD-10-CM

## 2019-12-03 DIAGNOSIS — R600 Localized edema: Secondary | ICD-10-CM

## 2019-12-03 NOTE — Patient Instructions (Signed)
Edema  Edema is an abnormal buildup of fluids in the body tissues and under the skin. Swelling of the legs, feet, and ankles is a common symptom that becomes more likely as you get older. Swelling is also common in looser tissues, like around the eyes. When the affected area is squeezed, the fluid may move out of that spot and leave a dent for a few moments. This dent is called pitting edema. There are many possible causes of edema. Eating too much salt (sodium) and being on your feet or sitting for a long time can cause edema in your legs, feet, and ankles. Hot weather may make edema worse. Common causes of edema include:  Heart failure.  Liver or kidney disease.  Weak leg blood vessels.  Cancer.  An injury.  Pregnancy.  Medicines.  Being obese.  Low protein levels in the blood. Edema is usually painless. Your skin may look swollen or shiny. Follow these instructions at home:  Keep the affected body part raised (elevated) above the level of your heart when you are sitting or lying down.  Do not sit still or stand for long periods of time.  Do not wear tight clothing. Do not wear garters on your upper legs.  Exercise your legs to get your circulation going. This helps to move the fluid back into your blood vessels, and it may help the swelling go down.  Wear elastic bandages or support stockings to reduce swelling as told by your health care provider.  Eat a low-salt (low-sodium) diet to reduce fluid as told by your health care provider.  Depending on the cause of your swelling, you may need to limit how much fluid you drink (fluid restriction).  Take over-the-counter and prescription medicines only as told by your health care provider. Contact a health care provider if:  Your edema does not get better with treatment.  You have heart, liver, or kidney disease and have symptoms of edema.  You have sudden and unexplained weight gain. Get help right away if:  You develop  shortness of breath or chest pain.  You cannot breathe when you lie down.  You develop pain, redness, or warmth in the swollen areas.  You have heart, liver, or kidney disease and suddenly get edema.  You have a fever and your symptoms suddenly get worse. Summary  Edema is an abnormal buildup of fluids in the body tissues and under the skin.  Eating too much salt (sodium) and being on your feet or sitting for a long time can cause edema in your legs, feet, and ankles.  Keep the affected body part raised (elevated) above the level of your heart when you are sitting or lying down. This information is not intended to replace advice given to you by your health care provider. Make sure you discuss any questions you have with your health care provider. Document Released: 11/26/2005 Document Revised: 11/29/2017 Document Reviewed: 12/29/2016 Elsevier Patient Education  2020 [employer]. Palpitations Palpitations are feelings that your heartbeat is irregular or is faster than normal. It may feel like your heart is fluttering or skipping a beat. Palpitations are usually not a serious problem. They may be caused by many things, including smoking, caffeine, alcohol, stress, and certain medicines or drugs. Most causes of palpitations are not serious. However, some palpitations can be a sign of a serious problem. You may need further tests to rule out serious medical problems. Follow these instructions at home:     Pay attention  to any changes in your condition. Take these actions to help manage your symptoms: Eating and drinking  Avoid foods and drinks that may cause palpitations. These may include: ? Caffeinated coffee, tea, soft drinks, diet pills, and energy drinks. ? Chocolate. ? Alcohol. Lifestyle  Take steps to reduce your stress and anxiety. Things that can help you relax include: ? Yoga. ? Mind-body activities, such as deep breathing, meditation, or using words and images to create  positive thoughts (guided imagery). ? Physical activity, such as swimming, jogging, or walking. Tell your health care provider if your palpitations increase with activity. If you have chest pain or shortness of breath with activity, do not continue the activity until you are seen by your health care provider. ? Biofeedback. This is a method that helps you learn to use your mind to control things in your body, such as your heartbeat.  Do not use drugs, including cocaine or ecstasy. Do not use marijuana.  Get plenty of rest and sleep. Keep a regular bed time. General instructions  Take over-the-counter and prescription medicines only as told by your health care provider.  Do not use any products that contain nicotine or tobacco, such as cigarettes and e-cigarettes. If you need help quitting, ask your health care provider.  Keep all follow-up visits as told by your health care provider. This is important. These may include visits for further testing if palpitations do not go away or get worse. Contact a health care provider if you:  Continue to have a fast or irregular heartbeat after 24 hours.  Notice that your palpitations occur more often. Get help right away if you:  Have chest pain or shortness of breath.  Have a severe headache.  Feel dizzy or you faint. Summary  Palpitations are feelings that your heartbeat is irregular or is faster than normal. It may feel like your heart is fluttering or skipping a beat.  Palpitations may be caused by many things, including smoking, caffeine, alcohol, stress, certain medicines, and drugs.  Although most causes of palpitations are not serious, some causes can be a sign of a serious medical problem.  Get help right away if you faint or have chest pain, shortness of breath, a severe headache, or dizziness. This information is not intended to replace advice given to you by your health care provider. Make sure you discuss any questions you have  with your health care provider. Document Released: 11/23/2000 Document Revised: 01/08/2018 Document Reviewed: 01/08/2018 Elsevier Patient Education  2020 [employer].

## 2019-12-03 NOTE — Progress Notes (Signed)
Subjective:    Patient ID: Ryan Glass, male    DOB: 04/25/1956, 63 y.o.   MRN: [id number]  63y/o [place] married established male pt in to clinic at staff request for BP and HR check. Pt concerned with recent BP readings at home that have been in 110's/60's. Questioning if these are too low. Cardiology not concerned with them as long as systolic >782. Pt reports feeling palpitations intermittently but less frequent than prior to ablation. Low HR on automatic home BP machine likely reading PVCs and showing inaccurate HR. Manual pulse check in clinic by RN [name] at 61bpm with full one minute check. 4 irregularities felt during that time; <3sec pause vs dropped beat vs PVC. Reports he has noticed that increased activity causes an increase in his palpitations, eg. Lifting and moving a couch last week caused palpitations, fatigue, and ShOB.  Reviewed Kardia/[employer] monitor benefits as it was suggested by Cardiology for home EKG readings. Pt interested and will look into purchasing with FSA funds during holiday break.  Patient noticed lower eyelids swelling some again hasn't been using nasal saline at work would like refill.  Leg swelling his usual--sock line noticeable.   Patient has noticed increased frequency of palpitations with activity  Review of Systems  Constitutional: Negative for activity change, appetite change, chills, diaphoresis, fatigue and fever.  HENT: Positive for congestion, sinus pressure and sinus pain. Negative for dental problem, drooling, ear discharge, ear pain, facial swelling, hearing loss, mouth sores, nosebleeds, postnasal drip, rhinorrhea, sneezing, sore throat, tinnitus, trouble swallowing and voice change.   Eyes: Negative for photophobia, pain, discharge, redness, itching and visual disturbance.  Respiratory: Positive for shortness of breath. Negative for cough, choking, wheezing and stridor.   Cardiovascular: Positive for palpitations and leg swelling. Negative  for chest pain.  Gastrointestinal: Negative for diarrhea, nausea and vomiting.  Endocrine: Negative for cold intolerance and heat intolerance.  Genitourinary: Negative for difficulty urinating.  Musculoskeletal: Negative for gait problem, neck pain and neck stiffness.  Allergic/Immunologic: Positive for environmental allergies. Negative for food allergies.  Neurological: Positive for weakness. Negative for dizziness, tremors, seizures, syncope, facial asymmetry, speech difficulty, light-headedness, numbness and headaches.  Hematological: Negative for adenopathy. Does not bruise/bleed easily.  Psychiatric/Behavioral: Negative for agitation, confusion and sleep disturbance.       Objective:   Physical Exam Vitals and nursing note reviewed.  Constitutional:      General: He is awake. He is not in acute distress.    Appearance: Normal appearance. He is well-developed and well-groomed. He is not ill-appearing, toxic-appearing or diaphoretic.  HENT:     Head: Normocephalic and atraumatic.     Jaw: There is normal jaw occlusion. No trismus.     Salivary Glands: Right salivary gland is not diffusely enlarged or tender. Left salivary gland is not diffusely enlarged or tender.     Right Ear: Hearing, ear canal and external ear normal. A middle ear effusion is present.     Left Ear: Hearing, ear canal and external ear normal. A middle ear effusion is present.     Nose: Mucosal edema and congestion present. No nasal deformity, septal deviation, laceration or rhinorrhea.     Right Turbinates: Enlarged and swollen. Not pale.     Left Turbinates: Enlarged and swollen. Not pale.     Right Sinus: Maxillary sinus tenderness present. No frontal sinus tenderness.     Left Sinus: Maxillary sinus tenderness present. No frontal sinus tenderness.     Mouth/Throat:  Lips: Pink. No lesions.     Mouth: Mucous membranes are moist. Mucous membranes are not pale, not dry and not cyanotic. No lacerations, oral  lesions or angioedema.     Dentition: No dental abscesses or gum lesions.     Tongue: No lesions. Tongue does not deviate from midline.     Palate: No mass and lesions.     Pharynx: Uvula midline. Pharyngeal swelling and posterior oropharyngeal erythema present. No oropharyngeal exudate or uvula swelling.     Tonsils: No tonsillar exudate or tonsillar abscesses.     Comments: Cobblestoning posterior pharynx; bilateral allergic shiners; bilateral TMs air fluid level; clear discharge bilateral nasal turbinates edema erythema Eyes:     General: Lids are normal. Vision grossly intact. Gaze aligned appropriately. Allergic shiner present. No visual field deficit or scleral icterus.       Right eye: No foreign body, discharge or hordeolum.        Left eye: No foreign body, discharge or hordeolum.     Extraocular Movements: Extraocular movements intact.     Right eye: Normal extraocular motion and no nystagmus.     Left eye: Normal extraocular motion and no nystagmus.     Conjunctiva/sclera: Conjunctivae normal.     Right eye: Right conjunctiva is not injected. No chemosis, exudate or hemorrhage.    Left eye: Left conjunctiva is not injected. No chemosis, exudate or hemorrhage.    Pupils: Pupils are equal, round, and reactive to light. Pupils are equal.     Right eye: Pupil is round and reactive.     Left eye: Pupil is round and reactive.     Comments: Lower eyelids 0-1+/4 nonpitting edema  Neck:     Thyroid: No thyroid mass or thyromegaly.     Trachea: Trachea normal. No tracheal tenderness or tracheal deviation.  Cardiovascular:     Rate and Rhythm: Normal rate. Rhythm irregular.     Chest Wall: PMI is not displaced.     Pulses: No decreased pulses.          Radial pulses are 2+ on the right side and 2+ on the left side.     Heart sounds: Normal heart sounds, S1 normal and S2 normal. No murmur. No friction rub. No gallop.      Comments: I evaluated patient standing and talking; irregular  pulse with pause/skipped beat varying drop/pause 3 beats skip 1; 5 beats skip 1; 12 beats skip 1 then 3 beats skip 1; then 5 beats skip 1 while standing and talking; patient aware of pauses/skipped beats; sock line pitting at ankle 0-1+/4 and up to mid calf with palpation nontender no erythema; RN Rolly Salter copied patient BP log and placed in paper chart. Pulmonary:     Effort: Pulmonary effort is normal. No respiratory distress.     Breath sounds: Normal breath sounds and air entry. No stridor, decreased air movement or transmitted upper airway sounds. No decreased breath sounds, wheezing, rhonchi or rales.     Comments: Spoke full sentences without difficulty; no cough observed; wearing disposable mask due to covid 19 pandemic Abdominal:     General: There is no distension.     Palpations: Abdomen is soft.  Musculoskeletal:        General: Swelling present. No tenderness, deformity or signs of injury. Normal range of motion.     Right shoulder: Normal.     Left shoulder: Normal.     Right elbow: Normal.     Left elbow: Normal.  Right wrist: Normal.     Left wrist: Normal.     Right hand: Normal.     Left hand: Normal.     Cervical back: Normal, normal range of motion and neck supple. No edema, erythema, signs of trauma, rigidity, torticollis or crepitus. No pain with movement. Normal range of motion.     Thoracic back: Normal.     Right hip: Normal.     Left hip: Normal.     Right knee: Normal.     Left knee: Normal.     Right lower leg: 1+ Pitting Edema present.     Left lower leg: 1+ Pitting Edema present.     Comments: Patient carrying lunch box, gift bag to clinic as leaving work after clinic visit; gait sure and steady; bilateral hand grasp equal 5/5; in/out of chair without difficulty; standing and talking in clinic with staff 15 minutes  Lymphadenopathy:     Head:     Right side of head: No submental, submandibular, tonsillar, preauricular, posterior auricular or occipital  adenopathy.     Left side of head: No submental, submandibular, tonsillar, preauricular, posterior auricular or occipital adenopathy.     Cervical: No cervical adenopathy.     Right cervical: No superficial, deep or posterior cervical adenopathy.    Left cervical: No superficial, deep or posterior cervical adenopathy.  Skin:    General: Skin is warm and dry.     Capillary Refill: Capillary refill takes less than 2 seconds.     Coloration: Skin is not ashen, cyanotic, jaundiced, mottled, pale or sallow.     Findings: No abrasion, abscess, acne, bruising, burn, ecchymosis, erythema, signs of injury, laceration, lesion, petechiae, rash or wound.     Nails: There is no clubbing.  Neurological:     General: No focal deficit present.     Mental Status: He is alert and oriented to person, place, and time. Mental status is at baseline.     GCS: GCS eye subscore is 4. GCS verbal subscore is 5. GCS motor subscore is 6.     Cranial Nerves: Cranial nerves are intact. No cranial nerve deficit, dysarthria or facial asymmetry.     Sensory: Sensation is intact. No sensory deficit.     Motor: Motor function is intact. No weakness, tremor, atrophy, abnormal muscle tone or seizure activity.     Coordination: Coordination is intact. Coordination normal.     Gait: Gait is intact. Gait normal.     Comments: Gait sure and steady  Psychiatric:        Attention and Perception: Attention and perception normal.        Mood and Affect: Mood and affect normal.        Speech: Speech normal.        Behavior: Behavior normal. Behavior is cooperative.        Thought Content: Thought content normal.        Cognition and Memory: Cognition and memory normal.        Judgment: Judgment normal.    1+ bilateral lower extremity edema pitting; sitting irregular beat x 3 in 60 seconds; standing and talking skipped beat after 3 regular then 5 regular than 12 regular then 3 regular than 5 regular beats.         Assessment &  Plan:  A-Bilateral lower extremity edema and palpitations, acute rhinosinusitis  P-Patient planning to purchase Kardia app/monitor recommended by PA Dunn (cardiology for christmas on sale) and start using.  Patient  instructed to rest if palpitations worsening with activity and if not improving he should seek evaluation with a provider same day consider labs to check potassium level history of low potassium on diuretics in previous two years; normal this year last check 10/23/2019 .  Discussed there can be different causes e.g. hypokalemia, medication, activity.  Also hypoxia, hypoglycemia, hypomagnesemia or other cardiac dysfunction.  Patient leaving work after clinic visit and planning to go home and rest and celebrate Christmas holiday with his wife and daughters today and tomorrow not working rest of day.  Continue BP log and write symptoms and activities if palpitations occurring more frequently.  This clinic does not have EKG machine to perform EKG while patient having palpitations. Elevate legs when sitting consider compression hose/knee high compression socks while at work as patient typically wears cotton ankle socks.  Exitcare handout printed and given on leg swelling and palpitations. Patient verbalized understanding information/instructions, agreed with plan of care and had no further questions at this time.  Patient may use normal saline nasal spray 2 sprays each nostril q2h wa as needed given 1 bottle from clinic stock encouraged patient to use more frequently then just at home 1-2 times per day.  Exposed to dust/chemicals at work. flonase 50mcg 1 spray each nostril BID. Continue singulair 10mg  po qhs.   Patient denied personal or family history of ENT cancer.   Avoid triggers if possible.  Shower prior to bedtime especially after work.  If allergic dust/dust mites recommend mattress/pillow covers/encasements; washing linens, vacuuming, sweeping, dusting weekly.  Call or return to clinic as needed  if these symptoms worsen or fail to improve as anticipated.  Patient verbalized understanding of instructions, agreed with plan of care and had no further questions at this time.  P2:  Avoidance and hand washing.

## 2019-12-10 ENCOUNTER — Encounter: Payer: Self-pay | Admitting: Registered Nurse

## 2019-12-10 ENCOUNTER — Telehealth: Payer: Self-pay | Admitting: Registered Nurse

## 2019-12-10 NOTE — Telephone Encounter (Signed)
Attempted to follow up with patient 12/08/2019 at workcenter and patient not in the office.  Returned to patient workcenter today and not in office.  Another employee stated that office typically on vacation this week.  Will attempt to contact patient via telephone follow up palpitations.

## 2019-12-10 NOTE — Telephone Encounter (Signed)
Patient contacted via telephone to follow up palpitations from last week.  He reported BP at home 145/80 wake up today  135/60 after breakfast  Headache yesterday and woke up in middle of night didn't sleep well but feeling better/headache resolved now.  Didn't check his blood pressure yesterday evening or night.  Denied frequent palpitations/dyspnea/chest pain.  Patient on vacation until Saturday 12/12/2019 return to work.  Discussed clinic closed until 12/13/2018 after today due to holiday.  Patient will follow up with [facility] next week for re-evaluation.  Patient had no further questions or concerns at this time and verbalized understanding of information/instructions.

## 2019-12-15 ENCOUNTER — Encounter: Payer: Self-pay | Admitting: Registered Nurse

## 2019-12-15 ENCOUNTER — Telehealth: Payer: Self-pay | Admitting: Registered Nurse

## 2019-12-15 NOTE — Telephone Encounter (Signed)
Overall sounds reassuring. Continue current plan! Thank you for the update.

## 2019-12-15 NOTE — Telephone Encounter (Signed)
noted 

## 2019-12-15 NOTE — Telephone Encounter (Signed)
Patient reported during vacation/holiday break he still noted heart skipping beat in afternoons but BP readings good last week.  Discussed with patient to upload his log to mychart. 12/28/2020114/68 HR 66 12/31 at 1015 126/76 hr 68 12/11/2019 at 1114 119/72 HR 59 Pulse RRR no irregular or skipped beats today for 1 minute check patient standing and talking to me in his workcenter. resp even and unlabored.  Trace nonpitting edema ankles.  Patient hasn't ordered kardio machine/app yet but planning to do so.

## 2020-01-19 ENCOUNTER — Telehealth: Payer: PRIVATE HEALTH INSURANCE | Admitting: Physician Assistant

## 2020-02-02 ENCOUNTER — Telehealth: Payer: Self-pay | Admitting: Registered Nurse

## 2020-02-02 ENCOUNTER — Encounter: Payer: Self-pay | Admitting: Registered Nurse

## 2020-02-02 NOTE — Telephone Encounter (Signed)
Patient reported he requested refills at [facility] and told no refills left on amlodipine, losartan or hydralazine.  Patient stated pharmacist contacting cardiology.  Discussed with patient we have amlodipine at [facility] formulary and I could dispense amlodipine to him today/bridge refill as last office visit cardiology 12 November 2019 note states 5mg  po BID #180 RF3.  Filled for patient from [facility] and given 90 day supply amlodipine 5mg  po BID #180 today. Patient said some days better than others for leg swelling.  Today has sock line prior to lunch pitting 1+/4 wearing cotton ankle socks.  Has noticed some palpitations.  Has not app yet.  Rheumatology stopped plaquenil and started him on 3 new medications last week also for arthritis in hands.  He wants to do follow up labs here but rheumatology wants him to be seen in office.  Discussed with patient that this is good plan of care from his provider and once he is on medications/stable he can probably rediscuss with rheumatology to see if labs can be drawn with RN at that time.   Patient also had questions on new medications from rheumatology-methotrexate, prednisone and ??.  Discussed with patient that methotrexate has been used for rheumatoid arthritis, prednisone for gout and arthritis and I will review Epic when I return to my desk as patient stopped me in his workcenter today for third medication name.  Discussed to take prednisone in the morning as can interrupt sleep if taken at night and it can also raise blood pressure to monitor his BP logs for trends.  Patient verbalized understanding information/instructions, agreed with plan of care and had no further questions at this time.  Reviewed Epic upon return to clinic.  Folic acid 1mg  po daily and Tylenol #3 po BID prescribed again for patient by rheumatology.  Will need to remind him to drink fluids/avoid dehydration as had decreased renal function when taking narcotics  previously.  Patient verbalized understanding information/instructions and had no further questions at this time.

## 2020-02-10 ENCOUNTER — Telehealth: Payer: PRIVATE HEALTH INSURANCE | Admitting: Physician Assistant

## 2020-02-11 ENCOUNTER — Telehealth: Payer: Self-pay | Admitting: Cardiology

## 2020-02-11 NOTE — Telephone Encounter (Signed)
We are recommending the COVID-19 vaccine to all of our patients. Cardiac medications (including blood thinners) should not deter anyone from being vaccinated and there is no need to hold any of those medications prior to vaccine administration.     Currently, there is a hotline to call (active 12/18/19) to schedule vaccination appointments as no walk-ins will be accepted.   Number: [phone].    If an appointment is not available please go to [email or web address] to sign up for notification when additional vaccine appointments are available.   If you have further questions or concerns about the vaccine process, please visit [email or web address] or contact your primary care physician.  I have informed the patient of the instructions. 

## 2020-02-23 ENCOUNTER — Encounter: Payer: Self-pay | Admitting: Registered Nurse

## 2020-02-23 ENCOUNTER — Telehealth: Payer: Self-pay | Admitting: Registered Nurse

## 2020-02-23 NOTE — Telephone Encounter (Signed)
Patient reported he received first covid vaccination and he is feeling slightly fatigued but otherwise well.  He had no further questions or concerns at this time and has second vaccination appt scheduled.  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  Patient verbalized understanding information/instructions, agreed with plan of care and had no further questions at this time.

## 2020-02-28 ENCOUNTER — Other Ambulatory Visit: Payer: Self-pay | Admitting: Cardiology

## 2020-03-01 ENCOUNTER — Telehealth: Payer: Self-pay | Admitting: Registered Nurse

## 2020-03-01 NOTE — Telephone Encounter (Signed)
Patient requested refills on potassium chloride, prednisone and atorvastatin.  He has enough amlodipine for right now.  Discussed with patient all other meds we do not carry at [facility] formulary  Patient will pick up on Thursday from clinic staff.  Patient verbalized understanding information/instructions and had no further questions at this time.

## 2020-03-03 ENCOUNTER — Encounter: Payer: Self-pay | Admitting: Registered Nurse

## 2020-03-03 ENCOUNTER — Ambulatory Visit: Payer: Self-pay | Admitting: Registered Nurse

## 2020-03-03 ENCOUNTER — Other Ambulatory Visit: Payer: Self-pay

## 2020-03-03 VITALS — BP 129/73 | HR 66 | Temp 97.3°F | Resp 16

## 2020-03-03 DIAGNOSIS — K439 Ventral hernia without obstruction or gangrene: Secondary | ICD-10-CM

## 2020-03-03 DIAGNOSIS — R1011 Right upper quadrant pain: Secondary | ICD-10-CM

## 2020-03-03 DIAGNOSIS — M5441 Lumbago with sciatica, right side: Secondary | ICD-10-CM

## 2020-03-03 NOTE — Patient Instructions (Signed)
Enlarged Spleen  An enlarged spleen (splenomegaly) is when the spleen is larger than normal. The spleen is an organ that is located in the upper left area of the abdomen, just under the ribs. The spleen is like a storage unit for red blood cells, and it also works to filter and clean the blood. It destroys cells that are damaged or worn out. The spleen is also important for fighting disease. This condition is usually noticed when the spleen is almost twice its normal size. An enlarged spleen is usually a sign of another health problem. What are the causes? This condition may be caused by:  Mononucleosis and other viral infections.  Infection with certain bacteria or parasites.  Liver failure (cirrhosis) and other liver diseases.  Blood diseases, such as hemolytic anemia.  Blood cancers, such as leukemia or Hodgkin's disease. Other causes include:  Tumors and fluid-filled sacs (cysts).  Metabolic disorders, such as Gaucher disease or Niemann-Pick disease.  Pressure or blood clots in the veins of the spleen.  Connective tissue disorders, such as lupus or rheumatoid arthritis. What are the signs or symptoms? Symptoms of this condition include:  Pain in the upper left part of the abdomen. The pain may spread to the left shoulder or get worse when you take a breath.  Feeling full without eating or after eating only a small amount.  Feeling tired.  Chronic infections.  Bleeding or bruising easily. In some cases, there are no symptoms. How is this diagnosed? This condition is diagnosed based on:  A physical exam. Your health care provider will feel the left upper part of your abdomen.  Tests, such as: ? Blood tests to check red and white blood cells and other proteins and enzymes. ? A biopsy. During a biopsy, a tissue sample of the liver or bone marrow may be removed and looked at under a microscope. A biopsy may be done if there is concern that the liver or bone marrow is  causing the enlarged spleen. ? An abdominal ultrasound. ? A CT scan. ? An MRI. How is this treated? Treatment for this condition depends on the cause. Treatment aims to:  Manage the conditions that cause enlargement of the spleen.  Reduce the size of the spleen. Treatment may include:  Medicines to treat infection or disease.  Radiation therapy.  Blood transfusions. If these treatments do not help or if the cause cannot be found, surgery to remove the spleen (splenectomy) may be recommended. After surgery, you may need to have vaccinations or take antibiotics to prevent infections. Follow these instructions at home: Medicines  Take over-the-counter and prescription medicines only as told by your health care provider.  If you were prescribed an antibiotic medicine, take it as told by your health care provider. Do not stop taking the antibiotic even if you start to feel better.  Talk with your health care provider about whether you need vaccinations to help prevent infections. This may be needed if treatment included surgery to remove the spleen. Not having a spleen makes certain infections more dangerous because it weakens the body's disease-fighting system (immune system). General instructions  Follow instructions from your health care provider about limiting your activities. To avoid injury or a ruptured spleen, make sure you: ? Avoid contact sports. ? Wear a seat belt in the car.  Keep all follow-up visits as told by your health care provider. This is important. Contact a health care provider if:  Your symptoms do not improve as expected.  You have a fever or chills.  You feel generally ill.  You have increased pain when you take in a breath. Get help right away if you:  Experience an injury or impact to the spleen area.  Have abdominal pain that becomes severe.  Feel dizzy or you faint.  Feel very weak.  Have cold and clammy skin.  Have sweating for no  reason.  Have chest pain or difficulty breathing. Summary  An enlarged spleen (splenomegaly) is when the spleen is larger than normal.  An enlarged spleen is usually a sign of another health problem.  This condition is treated with medicines, radiation therapy, blood transfusions, vaccines, or surgery. This information is not intended to replace advice given to you by your health care provider. Make sure you discuss any questions you have with your health care provider. Document Revised: 12/25/2018 Document Reviewed: 10/14/2018 Elsevier Patient Education  2020 [employer] Ventral Hernia  A ventral hernia is a bulge of tissue from inside the abdomen that pushes through a weak area of the muscles that form the front wall of the abdomen. The tissues inside the abdomen are inside a sac (peritoneum). These tissues include the small intestine, large intestine, and the fatty tissue that covers the intestines (omentum). Sometimes, the bulge that forms a hernia contains intestines. Other hernias contain only fat. Ventral hernias do not go away without surgical treatment. There are several types of ventral hernias. You may have:  A hernia at an incision site from previous abdominal surgery (incisional hernia).  A hernia just above the belly button (epigastric hernia), or at the belly button (umbilical hernia). These types of hernias can develop from heavy lifting or straining.  A hernia that comes and goes (reducible hernia). It may be visible only when you lift or strain. This type of hernia can be pushed back into the abdomen (reduced).  A hernia that traps abdominal tissue inside the hernia (incarcerated hernia). This type of hernia does not reduce.  A hernia that cuts off blood flow to the tissues inside the hernia (strangulated hernia). The tissues can start to die if this happens. This is a very painful bulge that cannot be reduced. A strangulated hernia is a medical emergency. What are the  causes? This condition is caused by abdominal tissue putting pressure on an area of weakness in the abdominal muscles. What increases the risk? The following factors may make you more likely to develop this condition:  Being male.  Being 9 or older.  Being overweight or obese.  Having had previous abdominal surgery, especially if there was an infection after surgery.  Having had an injury to the abdominal wall.  Having had several pregnancies.  Having a buildup of fluid inside the abdomen (ascites). What are the signs or symptoms? The only symptom of a ventral hernia may be a painless bulge in the abdomen. A reducible hernia may be visible only when you strain, cough, or lift. Other symptoms may include:  Dull pain.  A feeling of pressure. Signs and symptoms of a strangulated hernia may include:  Increasing pain.  Nausea and vomiting.  Pain when pressing on the hernia.  The skin over the hernia turning red or purple.  Constipation.  Blood in the stool (feces). How is this diagnosed? This condition may be diagnosed based on:  Your symptoms.  Your medical history.  A physical exam. You may be asked to cough or strain while standing. These actions increase the pressure inside your abdomen and  force the hernia through the opening in your muscles. Your health care provider may try to reduce the hernia by pressing on it.  Imaging studies, such as an ultrasound or CT scan. How is this treated? This condition is treated with surgery. If you have a strangulated hernia, surgery is done as soon as possible. If your hernia is small and not incarcerated, you may be asked to lose some weight before surgery. Follow these instructions at home:  Follow instructions from your health care provider about eating or drinking restrictions.  If you are overweight, your health care provider may recommend that you increase your activity level and eat a healthier diet.  Do not lift  anything that is heavier than 10 lb (4.5 kg).  Return to your normal activities as told by your health care provider. Ask your health care provider what activities are safe for you. You may need to avoid activities that increase pressure on your hernia.  Take over-the-counter and prescription medicines only as told by your health care provider.  Keep all follow-up visits as told by your health care provider. This is important. Contact a health care provider if:  Your hernia gets larger.  Your hernia becomes painful. Get help right away if:  Your hernia becomes increasingly painful.  You have pain along with any of the following: ? Changes in skin color in the area of the hernia. ? Nausea. ? Vomiting. ? Fever. Summary  A ventral hernia is a bulge of tissue from inside the abdomen that pushes through a weak area of the muscles that form the front wall of the abdomen.  This condition is treated with surgery, which may be urgent depending on your hernia.  Do not lift anything that is heavier than 10 lb (4.5 kg), and follow activity instructions from your health care provider. This information is not intended to replace advice given to you by your health care provider. Make sure you discuss any questions you have with your health care provider. Document Revised: 01/08/2018 Document Reviewed: 06/17/2017 Elsevier Patient Education  2020 [employer] Radicular Pain Radicular pain is a type of pain that spreads from your back or neck along a spinal nerve. Spinal nerves are nerves that leave the spinal cord and go to the muscles. Radicular pain is sometimes called radiculopathy, radiculitis, or a pinched nerve. When you have this type of pain, you may also have weakness, numbness, or tingling in the area of your body that is supplied by the nerve. The pain may feel sharp and burning. Depending on which spinal nerve is affected, the pain may occur in the:  Neck area (cervical radicular  pain). You may also feel pain, numbness, weakness, or tingling in the arms.  Mid-spine area (thoracic radicular pain). You would feel this pain in the back and chest. This type is rare.  Lower back area (lumbar radicular pain). You would feel this pain as low back pain. You may feel pain, numbness, weakness, or tingling in the buttocks or legs. Sciatica is a type of lumbar radicular pain that shoots down the back of the leg. Radicular pain occurs when one of the spinal nerves becomes irritated or squeezed (compressed). It is often caused by something pushing on a spinal nerve, such as one of the bones of the spine (vertebrae) or one of the round cushions between vertebrae (intervertebral disks). This can result from:  An injury.  Wear and tear or aging of a disk.  The growth of a bone  spur that pushes on the nerve. Radicular pain often goes away when you follow instructions from your health care provider for relieving pain at home. Follow these instructions at home: Managing pain      If directed, put ice on the affected area: ? Put ice in a plastic bag. ? Place a towel between your skin and the bag. ? Leave the ice on for 20 minutes, 2-3 times a day.  If directed, apply heat to the affected area as often as told by your health care provider. Use the heat source that your health care provider recommends, such as a moist heat pack or a heating pad. ? Place a towel between your skin and the heat source. ? Leave the heat on for 20-30 minutes. ? Remove the heat if your skin turns bright red. This is especially important if you are unable to feel pain, heat, or cold. You may have a greater risk of getting burned. Activity   Do not sit or rest in bed for long periods of time.  Try to stay as active as possible. Ask your health care provider what type of exercise or activity is best for you.  Avoid activities that make your pain worse, such as bending and lifting.  Do not lift anything  that is heavier than 10 lb (4.5 kg), or the limit that you are told, until your health care provider says that it is safe.  Practice using proper technique when lifting items. Proper lifting technique involves bending your knees and rising up.  Do strength and range-of-motion exercises only as told by your health care provider or physical therapist. General instructions  Take over-the-counter and prescription medicines only as told by your health care provider.  Pay attention to any changes in your symptoms.  Keep all follow-up visits as told by your health care provider. This is important. ? Your health care provider may send you to a physical therapist to help with this pain. Contact a health care provider if:  Your pain and other symptoms get worse.  Your pain medicine is not helping.  Your pain has not improved after a few weeks of home care.  You have a fever. Get help right away if:  You have severe pain, weakness, or numbness.  You have difficulty with bladder or bowel control. Summary  Radicular pain is a type of pain that spreads from your back or neck along a spinal nerve.  When you have radicular pain, you may also have weakness, numbness, or tingling in the area of your body that is supplied by the nerve.  The pain may feel sharp or burning.  Radicular pain may be treated with ice, heat, medicines, or physical therapy. This information is not intended to replace advice given to you by your health care provider. Make sure you discuss any questions you have with your health care provider. Document Revised: 06/10/2018 Document Reviewed: 06/10/2018 Elsevier Patient Education  [place]. Sciatica  Sciatica is pain, numbness, weakness, or tingling along the path of the sciatic nerve. The sciatic nerve starts in the lower back and runs down the back of each leg. The nerve controls the muscles in the lower leg and in the back of the knee. It also provides feeling  (sensation) to the back of the thigh, the lower leg, and the sole of the foot. Sciatica is a symptom of another medical condition that pinches or puts pressure on the sciatic nerve. Sciatica most often only affects one  side of the body. Sciatica usually goes away on its own or with treatment. In some cases, sciatica may come back (recur). What are the causes? This condition is caused by pressure on the sciatic nerve or pinching of the nerve. This may be the result of:  A disk in between the bones of the spine bulging out too far (herniated disk).  Age-related changes in the spinal disks.  A pain disorder that affects a muscle in the buttock.  Extra bone growth near the sciatic nerve.  A break (fracture) of the pelvis.  Pregnancy.  Tumor. This is rare. What increases the risk? The following factors may make you more likely to develop this condition:  Playing sports that place pressure or stress on the spine.  Having poor strength and flexibility.  A history of back injury or surgery.  Sitting for long periods of time.  Doing activities that involve repetitive bending or lifting.  Obesity. What are the signs or symptoms? Symptoms can vary from mild to very severe, and they may include:  Any of these problems in the lower back, leg, hip, or buttock: ? Mild tingling, numbness, or dull aches. ? Burning sensations. ? Sharp pains.  Numbness in the back of the calf or the sole of the foot.  Leg weakness.  Severe back pain that makes movement difficult. Symptoms may get worse when you cough, sneeze, or laugh, or when you sit or stand for long periods of time. How is this diagnosed? This condition may be diagnosed based on:  Your symptoms and medical history.  A physical exam.  Blood tests.  Imaging tests, such as: ? X-rays. ? MRI. ? CT scan. How is this treated? In many cases, this condition improves on its own without treatment. However, treatment may  include:  Reducing or modifying physical activity.  Exercising and stretching.  Icing and applying heat to the affected area.  Medicines that help to: ? Relieve pain and swelling. ? Relax your muscles.  Injections of medicines that help to relieve pain, irritation, and inflammation around the sciatic nerve (steroids).  Surgery. Follow these instructions at home: Medicines  Take over-the-counter and prescription medicines only as told by your health care provider.  Ask your health care provider if the medicine prescribed to you: ? Requires you to avoid driving or using heavy machinery. ? Can cause constipation. You may need to take these actions to prevent or treat constipation:  Drink enough fluid to keep your urine pale yellow.  Take over-the-counter or prescription medicines.  Eat foods that are high in fiber, such as beans, whole grains, and fresh fruits and vegetables.  Limit foods that are high in fat and processed sugars, such as fried or sweet foods. Managing pain      If directed, put ice on the affected area. ? Put ice in a plastic bag. ? Place a towel between your skin and the bag. ? Leave the ice on for 20 minutes, 2-3 times a day.  If directed, apply heat to the affected area. Use the heat source that your health care provider recommends, such as a moist heat pack or a heating pad. ? Place a towel between your skin and the heat source. ? Leave the heat on for 20-30 minutes. ? Remove the heat if your skin turns bright red. This is especially important if you are unable to feel pain, heat, or cold. You may have a greater risk of getting burned. Activity   Return to your  normal activities as told by your health care provider. Ask your health care provider what activities are safe for you.  Avoid activities that make your symptoms worse.  Take brief periods of rest throughout the day. ? When you rest for longer periods, mix in some mild activity or  stretching between periods of rest. This will help to prevent stiffness and pain. ? Avoid sitting for long periods of time without moving. Get up and move around at least one time each hour.  Exercise and stretch regularly, as told by your health care provider.  Do not lift anything that is heavier than 10 lb (4.5 kg) while you have symptoms of sciatica. When you do not have symptoms, you should still avoid heavy lifting, especially repetitive heavy lifting.  When you lift objects, always use proper lifting technique, which includes: ? Bending your knees. ? Keeping the load close to your body. ? Avoiding twisting. General instructions  Maintain a healthy weight. Excess weight puts extra stress on your back.  Wear supportive, comfortable shoes. Avoid wearing high heels.  Avoid sleeping on a mattress that is too soft or too hard. A mattress that is firm enough to support your back when you sleep may help to reduce your pain.  Keep all follow-up visits as told by your health care provider. This is important. Contact a health care provider if:  You have pain that: ? Wakes you up when you are sleeping. ? Gets worse when you lie down. ? Is worse than you have experienced in the past. ? Lasts longer than 4 weeks.  You have an unexplained weight loss. Get help right away if:  You are not able to control when you urinate or have bowel movements (incontinence).  You have: ? Weakness in your lower back, pelvis, buttocks, or legs that gets worse. ? Redness or swelling of your back. ? A burning sensation when you urinate. Summary  Sciatica is pain, numbness, weakness, or tingling along the path of the sciatic nerve.  This condition is caused by pressure on the sciatic nerve or pinching of the nerve.  Sciatica can cause pain, numbness, or tingling in the lower back, legs, hips, and buttocks.  Treatment often includes rest, exercise, medicines, and applying ice or heat. This  information is not intended to replace advice given to you by your health care provider. Make sure you discuss any questions you have with your health care provider. Document Revised: 12/15/2018 Document Reviewed: 12/15/2018 Elsevier Patient Education  2020 [employer]. Sciatica Rehab Ask your health care provider which exercises are safe for you. Do exercises exactly as told by your health care provider and adjust them as directed. It is normal to feel mild stretching, pulling, tightness, or discomfort as you do these exercises. Stop right away if you feel sudden pain or your pain gets worse. Do not begin these exercises until told by your health care provider. Stretching and range-of-motion exercises These exercises warm up your muscles and joints and improve the movement and flexibility of your hips and back. These exercises also help to relieve pain, numbness, and tingling. Sciatic nerve glide 1. Sit in a chair with your head facing down toward your chest. Place your hands behind your back. Let your shoulders slump forward. 2. Slowly straighten one of your legs while you tilt your head back as if you are looking toward the ceiling. Only straighten your leg as far as you can without making your symptoms worse. 3. Hold this position  for ____15______ seconds. 4. Slowly return to the starting position. 5. Repeat with your other leg. Repeat _____3_____ times. Complete this exercise ____3______ times a day. Knee to chest with hip adduction and internal rotation  1. Lie on your back on a firm surface with both legs straight. 2. Bend one of your knees and move it up toward your chest until you feel a gentle stretch in your lower back and buttock. Then, move your knee toward the shoulder that is on the opposite side from your leg. This is hip adduction and internal rotation. ? Hold your leg in this position by holding on to the front of your knee. 3. Hold this position for ____15______  seconds. 4. Slowly return to the starting position. 5. Repeat with your other leg. Repeat _____3_____ times. Complete this exercise ____3______ times a day. Prone extension on elbows  1. Lie on your abdomen on a firm surface. A bed may be too soft for this exercise. 2. Prop yourself up on your elbows. 3. Use your arms to help lift your chest up until you feel a gentle stretch in your abdomen and your lower back. ? This will place some of your body weight on your elbows. If this is uncomfortable, try stacking pillows under your chest. ? Your hips should stay down, against the surface that you are lying on. Keep your hip and back muscles relaxed. 4. Hold this position for ___15_______ seconds. 5. Slowly relax your upper body and return to the starting position. Repeat _____3_____ times. Complete this exercise ___3_______ times a day. Strengthening exercises These exercises build strength and endurance in your back. Endurance is the ability to use your muscles for a long time, even after they get tired. Pelvic tilt This exercise strengthens the muscles that lie deep in the abdomen. 1. Lie on your back on a firm surface. Bend your knees and keep your feet flat on the floor. 2. Tense your abdominal muscles. Tip your pelvis up toward the ceiling and flatten your lower back into the floor. ? To help with this exercise, you may place a small towel under your lower back and try to push your back into the towel. 3. Hold this position for _____15_____ seconds. 4. Let your muscles relax completely before you repeat this exercise. Repeat ____3______ times. Complete this exercise _____3_____ times a day. Alternating arm and leg raises  1. Get on your hands and knees on a firm surface. If you are on a hard floor, you may want to use padding, such as an exercise mat, to cushion your knees. 2. Line up your arms and legs. Your hands should be directly below your shoulders, and your knees should be directly  below your hips. 3. Lift your left leg behind you. At the same time, raise your right arm and straighten it in front of you. ? Do not lift your leg higher than your hip. ? Do not lift your arm higher than your shoulder. ? Keep your abdominal and back muscles tight. ? Keep your hips facing the ground. ? Do not arch your back. ? Keep your balance carefully, and do not hold your breath. 4. Hold this position for ____15______ seconds. 5. Slowly return to the starting position. 6. Repeat with your right leg and your left arm. Repeat ____3______ times. Complete this exercise _____3_____ times a day. Posture and body mechanics Good posture and healthy body mechanics can help to relieve stress in your body's tissues and joints. Body mechanics refers to  the movements and positions of your body while you do your daily activities. Posture is part of body mechanics. Good posture means:  Your spine is in its natural S-curve position (neutral).  Your shoulders are pulled back slightly.  Your head is not tipped forward. Follow these guidelines to improve your posture and body mechanics in your everyday activities. Standing   When standing, keep your spine neutral and your feet about hip width apart. Keep a slight bend in your knees. Your ears, shoulders, and hips should line up.  When you do a task in which you stand in one place for a long time, place one foot up on a stable object that is 2-4 inches (5-10 cm) high, such as a footstool. This helps keep your spine neutral. Sitting   When sitting, keep your spine neutral and keep your feet flat on the floor. Use a footrest, if necessary, and keep your thighs parallel to the floor. Avoid rounding your shoulders, and avoid tilting your head forward.  When working at a desk or a computer, keep your desk at a height where your hands are slightly lower than your elbows. Slide your chair under your desk so you are close enough to maintain good  posture.  When working at a computer, place your monitor at a height where you are looking straight ahead and you do not have to tilt your head forward or downward to look at the screen. Resting  When lying down and resting, avoid positions that are most painful for you.  If you have pain with activities such as sitting, bending, stooping, or squatting, lie in a position in which your body does not bend very much. For example, avoid curling up on your side with your arms and knees near your chest (fetal position).  If you have pain with activities such as standing for a long time or reaching with your arms, lie with your spine in a neutral position and bend your knees slightly. Try the following positions: ? Lying on your side with a pillow between your knees. ? Lying on your back with a pillow under your knees. Lifting   When lifting objects, keep your feet at least shoulder width apart and tighten your abdominal muscles.  Bend your knees and hips and keep your spine neutral. It is important to lift using the strength of your legs, not your back. Do not lock your knees straight out.  Always ask for help to lift heavy or awkward objects. This information is not intended to replace advice given to you by your health care provider. Make sure you discuss any questions you have with your health care provider. Document Revised: 03/20/2019 Document Reviewed: 12/18/2018 [employer] Patient Education  2020 [employer].

## 2020-03-03 NOTE — Telephone Encounter (Signed)
Patient planning month long trip to [place] in May will need refill amlodipine 5mg  po BID (says he has enough for now) and more potassium take 2 po BID #240 dispensed today as did not have enough stock on hand to dispense full quantity to patient today.  He will also need prednisone 10mg  po daily filled as only 84 days dispensed today.  Atorvastatin 20mg  po daily #90 RF0 dispensed from [facility] to patient today and he may need early dispense to ensure he does not run out while traveling internationally.  Discussed with patient and notified him these were the only Rx we are able to fill currently on his medication list.  Patient verbalized understanding information/instructions and had no further questions at this time.

## 2020-03-03 NOTE — Progress Notes (Addendum)
Subjective:    Patient ID: Ryan Glass, male    DOB: 05/09/1956, 64 y.o.   MRN: [id number]  63y/o hispanic male established married patient here for evaluation right abdomen pain and back pain right sided going down his right leg.  Patient has history of umbilical hernia and right inguinal repair with mesh 2016.  Patient reported worst pain in the mornings 9/10 improves as the day goes on.  Eating doesn't change the pain.  He felt like throwing up today and stopped the car while driving on side of the road but he didn't throw up.  If he burps it feels like pressure is relieving.  Denied fever/chills/diarrhea/changes in stool.  He has been taking prednisone for inflammatory arthritis starting 01/29/2020.  He is wondering if he has pancreatitis.  Wants me to review his last abdomen scan results again with him in clinic today and obtain a printed copy.  Wondering if diclofenac he has left over at home would help.  He takes tylenol #3 300/30mg  po prn not every day also for chronic pain.  History of gout also.  Also on atorvastatin for hyperlipidemia.  BMI 38.55  Denied loss of bowel/bladder control, saddle paresthesias or arm/leg weakness.     Review of Systems  Constitutional: Negative for activity change, appetite change, chills, diaphoresis, fatigue, fever and unexpected weight change.  HENT: Negative for trouble swallowing and voice change.   Eyes: Negative for photophobia and visual disturbance.  Respiratory: Negative for cough, shortness of breath, wheezing and stridor.   Cardiovascular: Negative for chest pain and palpitations.  Gastrointestinal: Positive for abdominal pain and nausea. Negative for blood in stool, constipation, diarrhea and vomiting.  Endocrine: Negative for cold intolerance and heat intolerance.  Genitourinary: Negative for difficulty urinating.  Musculoskeletal: Positive for arthralgias, back pain and myalgias. Negative for gait problem, neck pain and neck stiffness.    Skin: Negative for rash.  Allergic/Immunologic: Positive for environmental allergies and immunocompromised state. Negative for food allergies.  Neurological: Negative for dizziness, tremors, seizures, syncope, facial asymmetry, speech difficulty, weakness, light-headedness, numbness and headaches.  Hematological: Negative for adenopathy. Does not bruise/bleed easily.  Psychiatric/Behavioral: Negative for agitation, confusion and sleep disturbance.       Objective:   Physical Exam Vitals reviewed.  Constitutional:      General: He is awake.     Appearance: Normal appearance. He is well-developed and well-groomed. He is obese. He is not ill-appearing, toxic-appearing or diaphoretic.  HENT:     Head: Normocephalic and atraumatic.     Jaw: There is normal jaw occlusion.     Salivary Glands: Right salivary gland is not diffusely enlarged or tender. Left salivary gland is not diffusely enlarged or tender.     Right Ear: Hearing and external ear normal.     Left Ear: Hearing and external ear normal.     Nose: Nose normal.     Mouth/Throat:     Lips: Pink. No lesions.     Mouth: Mucous membranes are moist. No angioedema.     Pharynx: Oropharynx is clear.  Eyes:     General: Lids are normal. Vision grossly intact. Gaze aligned appropriately. Allergic shiner present. No visual field deficit or scleral icterus.       Right eye: No discharge.        Left eye: No discharge.     Extraocular Movements: Extraocular movements intact.     Conjunctiva/sclera: Conjunctivae normal.     Pupils: Pupils are equal, round, and  reactive to light.  Neck:     Thyroid: No thyromegaly.     Trachea: Trachea normal.  Cardiovascular:     Rate and Rhythm: Normal rate and regular rhythm.     Pulses: Normal pulses.          Radial pulses are 2+ on the right side and 2+ on the left side.     Heart sounds: Normal heart sounds, S1 normal and S2 normal. Heart sounds not distant. No murmur. No friction rub. No  gallop.      Comments: Bilateral ankle sock lines Pulmonary:     Effort: Pulmonary effort is normal. No respiratory distress.     Breath sounds: Normal breath sounds and air entry. No stridor, decreased air movement or transmitted upper airway sounds. No decreased breath sounds, wheezing, rhonchi or rales.     Comments: Wearing cloth mask due to covid 19 pandemic; spoke full sentences without difficulty; no cough observed in clinic Abdominal:     General: Bowel sounds are decreased.     Palpations: Abdomen is soft. There is no shifting dullness, fluid wave or pulsatile mass.     Tenderness: There is abdominal tenderness in the right upper quadrant, right lower quadrant, epigastric area and left upper quadrant. There is no right CVA tenderness, left CVA tenderness, guarding or rebound. Negative signs include Murphy's sign.     Hernia: A hernia is present. Hernia is present in the ventral area. There is no hernia in the umbilical area.       Comments: Dull to percussion x 4 quads; tender but not guarding or exquisitely TTP; hypoactive bowel sounds x 4 quads; irregularly shaped ventral hernia linear proximal to epigastric but widens as approaches umbilicus 4-6cmx7cm bulging without abdominal crunch and with sitting today  Musculoskeletal:        General: Swelling present. No tenderness, deformity or signs of injury.     Right shoulder: Normal.     Left shoulder: Normal.     Right elbow: Normal.     Left elbow: Normal.     Cervical back: Normal range of motion and neck supple. No swelling, edema, deformity, erythema, signs of trauma, lacerations, rigidity, torticollis or crepitus.     Thoracic back: No swelling, edema, deformity, signs of trauma, lacerations or tenderness. No scoliosis.     Lumbar back: No swelling, edema, deformity, signs of trauma or lacerations. Decreased range of motion. No scoliosis.       Back:     Right lower leg: Edema present.     Left lower leg: Edema present.   Lymphadenopathy:     Head:     Right side of head: No submental, submandibular, tonsillar or preauricular adenopathy.     Left side of head: No submental, submandibular, tonsillar or preauricular adenopathy.     Cervical: No cervical adenopathy.     Right cervical: No superficial cervical adenopathy.    Left cervical: No superficial cervical adenopathy.  Skin:    General: Skin is warm.     Capillary Refill: Capillary refill takes less than 2 seconds.     Coloration: Skin is not ashen, cyanotic, jaundiced, mottled, pale or sallow.     Findings: No abrasion, abscess, acne, bruising, burn, ecchymosis, erythema, signs of injury, laceration, lesion, petechiae, rash or wound.     Nails: There is no clubbing.  Neurological:     General: No focal deficit present.     Mental Status: He is alert and oriented to person, place,  and time. Mental status is at baseline.     GCS: GCS eye subscore is 4. GCS verbal subscore is 5. GCS motor subscore is 6.     Cranial Nerves: Cranial nerves are intact. No cranial nerve deficit, dysarthria or facial asymmetry.     Sensory: Sensation is intact. No sensory deficit.     Motor: Motor function is intact. No weakness, tremor, atrophy, abnormal muscle tone or seizure activity.     Coordination: Coordination is intact. Coordination normal.     Gait: Gait is intact. Gait normal.     Comments: Gait sure and steady in exam room/hall; in/out of chair without difficulty; sitting to lying supine on exam table slow due to worsening pain back spasms and assist to move from supine to sitting; bilateral hand grasp and lower extremity strength equal 5/5  Psychiatric:        Attention and Perception: Attention and perception normal.        Mood and Affect: Mood and affect normal.        Speech: Speech normal.        Behavior: Behavior normal. Behavior is cooperative.        Thought Content: Thought content normal.        Cognition and Memory: Cognition and memory normal.         Judgment: Judgment normal.       CLINICAL DATA:  Thoracic aortic aneurysm follow-up.  EXAM: CT ANGIOGRAPHY CHEST AND ABDOMEN  TECHNIQUE: Multidetector CT imaging of the chest and abdomen was performed using the standard protocol during bolus administration of intravenous contrast. Multiplanar CT image reconstructions and MIPs were obtained to evaluate the vascular anatomy.  CONTRAST:  ISOVUE-370 IOPAMIDOL (ISOVUE-370) INJECTION 76%  COMPARISON:  CT chest, abdomen, and pelvis dated February 09, 2016.  FINDINGS: CTA CHEST FINDINGS  Cardiovascular: Interval increase in size of ascending thoracic aortic aneurysm, now measuring 4.3 cm, previously 4.1 cm. No aortic dissection. Trace pericardial fluid/thickening anteriorly, unchanged. Three-vessel coronary artery atherosclerosis. No central pulmonary embolism.  Mediastinum/Nodes: No enlarged mediastinal, hilar, or axillary lymph nodes. Thyroid gland, trachea, and esophagus demonstrate no significant findings.  Lungs/Pleura: Unchanged tiny loculated pleural effusion in the medial aspect of the left hemithorax adjacent to the descending thoracic aorta. No consolidation or pneumothorax. No suspicious pulmonary nodule. Minimal bibasilar atelectasis.  Musculoskeletal: No chest wall abnormality. No acute or significant osseous findings.  Review of the MIP images confirms the above findings.  CTA ABDOMEN FINDINGS  Aorta: Normal caliber aorta without aneurysm, dissection, vasculitis or significant stenosis. Mild atherosclerotic calcification.  Celiac: Slightly increased ectasia of the proximal celiac artery, measuring 1.9 cm, previously 1.6 cm. No dissection, vasculitis, or significant stenosis.  SMA: Patent without evidence of aneurysm, dissection, vasculitis or significant stenosis.  Renals: Both renal arteries are patent without evidence of aneurysm, dissection, vasculitis, fibromuscular dysplasia or  significant stenosis.  IMA: Patent proximally without evidence of aneurysm, dissection, vasculitis or significant stenosis.  Inflow: Patent proximal common iliac arteries without evidence of aneurysm, dissection, vasculitis or significant stenosis.  Veins: No obvious venous abnormality within the limitations of this arterial phase study.  Hepatobiliary: Tiny subcentimeter low-density lesion in the right hepatic lobe remains too small to characterize, but is unchanged. No new focal liver abnormality. The gallbladder is decompressed. No biliary dilatation.  Pancreas: Unremarkable. No ductal dilatation or surrounding inflammatory changes.  Spleen: Mildly enlarged, not significantly changed. No focal abnormality.  Adrenals/Urinary Tract: The adrenal glands are unremarkable. Subcentimeter low-density lesions in both  kidneys remain too small to characterize, but are unchanged. No hydronephrosis or renal calculi.  Stomach/Bowel: The stomach is within normal limits. The visualized bowel loops are unremarkable. The appendix is normal.  Lymphatic: No enlarged abdominal lymph nodes.  Musculoskeletal: No acute or significant osseous finding. Stable severe degenerative disc disease at L2-L3 with 6 mm retrolisthesis.  Other: None.  Review of the MIP images confirms the above findings.  IMPRESSION: 1. Slight interval increase in size of ascending thoracic aortic aneurysm, now measuring 4.3 cm, previously 4.1 cm. Recommend annual imaging followup by CTA or MRA. This recommendation follows 2010 ACCF/AHA/AATS/ACR/ASA/SCA/SCAI/SIR/STS/SVM Guidelines for the Diagnosis and Management of Patients with Thoracic Aortic Disease. Circulation. 2010; 121: K481-E563 2. Slight interval increase in ectasias of the proximal celiac artery, now measuring 1.9 cm, previously 1.6 cm. Attention on follow-up imaging is recommended. 3.  Aortic atherosclerosis (ICD10-I70.0). 4. Mild  splenomegaly, unchanged.   Electronically Signed   By: Obie Dredge M.D.   On: 05/29/2018 08:17  CLINICAL DATA:  Follow-up thoracic aortic aneurysm.  EXAM: CT ANGIOGRAPHY CHEST WITH CONTRAST  TECHNIQUE: Multidetector CT imaging of the chest was performed using the standard protocol during bolus administration of intravenous contrast. Multiplanar CT image reconstructions and MIPs were obtained to evaluate the vascular anatomy.  CONTRAST:  OMNIPAQUE IOHEXOL 350 MG/ML SOLN  COMPARISON:  Chest CT-05/28/2018; 02/09/2016  FINDINGS: Vascular Findings:  Grossly unchanged fusiform aneurysmal dilatation of the ascending thoracic aorta was measurements as follows. The thoracic aorta tapers to a normal level at the level of the aortic arch. No evidence of thoracic aortic dissection or periaortic stranding on this nongated examination.  There is a minimal amount of calcified atherosclerotic plaque involving the aortic arch and descending thoracic aorta, not resulting in a hemodynamically significant stenosis. Note is made of a small ductus diverticulum. The descending thoracic aorta is noted to be tortuous, especially at its distal aspect, but of normal caliber and widely patent.  Conventional configuration of the aortic arch. The branch vessels of the aortic arch are tortuous though appear widely patent without hemodynamically significant narrowing.  Cardiomegaly. Trace pericardial effusion with small amount of fluid seen within the pericardial recess, grossly unchanged.  Although this examination was not tailored for the evaluation the pulmonary arteries, there are no discrete filling defects within the central pulmonary arterial tree to suggest central pulmonary embolism. Borderline enlarged caliber of the main pulmonary artery measuring 33 mm in diameter.  -------------------------------------------------------------  Thoracic aortic  measurements:  Sinotubular junction  42 mm as measured in greatest oblique short axis coronal dimension.  Proximal ascending aorta  43 mm as measured in greatest oblique short axis axial dimension at the level of the main pulmonary artery (image 46, series 4) and 43 mm in greatest oblique short axis coronal diameter (coronal image 46, series 7), unchanged compared to the 02/2016 examination, previously, 43 mm and 44 mm respectively.  Aortic arch aorta  37 mm as measured in greatest oblique short axis sagittal dimension.  Proximal descending thoracic aorta  33 mm as measured in greatest oblique short axis axial dimension at the level of the main pulmonary artery.  Distal descending thoracic aorta  29 mm as measured in greatest oblique short axis axial dimension at the level of the diaphragmatic hiatus.  Review of the MIP images confirms the above findings.  -------------------------------------------------------------  Non-Vascular Findings:  Mediastinum/Lymph Nodes: No bulky mediastinal, hilar axillary lymphadenopathy.  Lungs/Pleura: No focal airspace opacities. Minimal subsegmental atelectasis noted about the tortuous descending thoracic  aorta, improved compared to the 2017 examination. Minimal linear subsegmental atelectasis within the right lower lobe. No new focal airspace opacities. No pleural effusion or pneumothorax. The central pulmonary airways appear widely patent.  No discrete pulmonary nodules.  Upper abdomen: Limited early arterial phase evaluation of the upper abdomen demonstrates fusiform ectasia of the distal aspect of the celiac trunk prior to the vessel's bifurcation measuring approximately 1.7 cm in greatest short axis axial diameter (image 96, series 4), unchanged compared to the 2017 examination.  Musculoskeletal: No acute or aggressive osseous abnormalities. Mild (approximately 25%) compression deformity involving the  approximate T12 vertebral body, unchanged. Mild DDD of C6-C7 with disc space height loss, endplate irregularity. Regional soft tissues appear normal. Normal appearance of the thyroid gland.  IMPRESSION: 1. Stable uncomplicated mild fusiform aneurysmal dilatation of the ascending thoracic aorta measuring 43 mm in diameter, unchanged compared to the 02/2016 examination. Aortic aneurysm NOS (ICD10-I71.9). 2. Similar findings of cardiomegaly, mild enlargement of the caliber of the main pulmonary artery and trace pericardial effusion. Further evaluation cardiac echo could be performed as indicated. 3. 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 4.  Aortic Atherosclerosis (ICD10-I70.0).   Electronically Signed   By: Simonne Come M.D.   On: 10/09/2019 15:53 CLINICAL DATA: Right lower quadrant pain x2 months, groin pain  EXAM: CT ABDOMEN AND PELVIS WITH CONTRAST  TECHNIQUE: Multidetector CT imaging of the abdomen and pelvis was performed using the standard protocol following bolus administration of intravenous contrast.  CONTRAST: 100 mL Omnipaque 350 IV  COMPARISON: None.  FINDINGS: Lower chest: Focal patchy opacity in the right middle lobe (series 3/image 10), likely atelectasis.  Hepatobiliary: Liver is within normal limits. No suspicious/enhancing hepatic lesions.  Gallbladder is unremarkable. No intrahepatic or extrahepatic ductal dilatation.  Pancreas: Within normal limits.  Spleen: Within normal limits.  Adrenals/Urinary Tract: Adrenal glands are within normal limits.  Kidneys are within normal limits. No hydronephrosis.  Bladder is underdistended but unremarkable.  Stomach/Bowel: Stomach is within normal limits.  No evidence of bowel obstruction.  Normal appendix.  Vascular/Lymphatic: Atherosclerotic calcifications of the  abdominal aorta and branch vessels.  No suspicious abdominopelvic lymphadenopathy.  Reproductive: Prostate is grossly unremarkable.  Other: No abdominopelvic ascites.  Small fat containing right inguinal hernia.  Small fat containing periumbilical hernia.  Musculoskeletal: Degenerative changes of the visualized thoracolumbar spine, most prominent at L1-2 and L2-3.  IMPRESSION: No evidence of bowel obstruction. Normal appendix.  Small fat containing right inguinal hernia. Small fat containing periumbilical hernia.   Electronically Signed  By: Charline Bills M.D.  On: 07/07/2015 08:19  Other Result Information  Interface, Rad Results In - 07/07/2015  8:21 AM EDT CLINICAL DATA:  Right lower quadrant pain x2 months, groin pain  EXAM: CT ABDOMEN AND PELVIS WITH CONTRAST  TECHNIQUE: Multidetector CT imaging of the abdomen and pelvis was performed using the standard protocol following bolus administration of intravenous contrast.  CONTRAST:  100 mL Omnipaque 350 IV  COMPARISON:  None.  FINDINGS: Lower chest: Focal patchy opacity in the right middle lobe (series 3/image 10), likely atelectasis.  Hepatobiliary: Liver is within normal limits. No suspicious/enhancing hepatic lesions.  Gallbladder is unremarkable. No intrahepatic or extrahepatic ductal dilatation.  Pancreas: Within normal limits.  Spleen: Within normal limits.  Adrenals/Urinary Tract: Adrenal glands are within normal limits.  Kidneys are within normal limits.  No hydronephrosis.  Bladder is underdistended but unremarkable.  Stomach/Bowel: Stomach is within normal limits.  No evidence of bowel obstruction.  Normal appendix.  Vascular/Lymphatic: Atherosclerotic calcifications of the abdominal aorta and branch vessels.  No suspicious abdominopelvic lymphadenopathy.  Reproductive: Prostate is grossly unremarkable.  Other: No abdominopelvic ascites.  Small fat containing right  inguinal hernia.  Small fat containing periumbilical hernia.  Musculoskeletal: Degenerative changes of the visualized thoracolumbar spine, most prominent at L1-2 and L2-3.  IMPRESSION: No evidence of bowel obstruction.  Normal appendix.  Small fat containing right inguinal hernia. Small fat containing periumbilical hernia.   Electronically Signed   By: Julian Hy M.D.   On: 07/07/2015 08:19    normal renal ultrasound May 2020 84  84  81     BUN 8 - 27 mg/dL 24  20  20    Creatinine, Ser 0.76 - 1.27 mg/dL 1.47High   1.14  1.17   GFR calc non Af Amer >59 mL/min/1.73 50Low   69  66   GFR calc Af Amer >59 mL/min/1.73 58Low   79  77   BUN/Creatinine Ratio 10 - 24 16  18  17    Sodium 134 - 144 mmol/L 139  138  138   Potassium 3.5 - 5.2 mmol/L 3.4Low   3.7  3.5   Chloride 96 - 106 mmol/L 101  99  101   CO2 20 - 29 mmol/L 21  26  20    Calcium 8.6 - 10.2 mg/dL 9.5  9.5  9.1   Total Protein 6.0 - 8.5 g/dL 7.0     Albumin 3.8 - 4.8 g/dL 4.9High      Globulin, Total 1.5 - 4.5 g/dL 2.1     Albumin/Globulin Ratio 1.2 - 2.2 2.3High      Bilirubin Total 0.0 - 1.2 mg/dL 0.5     Alkaline Phosphatase 39 - 117 IU/L 85     AST 0 - 40 IU/L 30     ALT 0 - 44 IU/L 29      84  84  85     BUN 8 - 27 mg/dL 18  18  17    Creatinine, Ser 0.76 - 1.27 mg/dL 1.20  1.18  1.23   GFR calc non Af Amer >59 mL/min/1.73 64  65  62   GFR calc Af Amer >59 mL/min/1.73 74  75  72   BUN/Creatinine Ratio 10 - 24 15  15  14    Sodium 134 - 144 mmol/L 138  142  138   Potassium 3.5 - 5.2 mmol/L 3.7  4.0  4.1   Chloride 96 - 106 mmol/L 100  106  104   CO2 20 - 29 mmol/L 19Low   22  22   Calcium 8.6 - 10.2 mg/dL 9.2  9.9  9.4    Results for Ryan, Glass (MRN [id number]) as of 03/03/2020 19:40  Ref. Range 09/25/2018 15:57  WBC Latest Ref Range: 3.4 - 10.8 x10E3/uL 5.4  RBC Latest Ref Range: 4.14 - 5.80 x10E6/uL 4.32  Hemoglobin Latest Ref Range: 13.0 - 17.7 g/dL 13.9  HCT Latest Ref Range: 37.5 - 51.0 %  40.4  MCV Latest Ref Range: 79 - 97 fL 94  MCH Latest Ref Range: 26.6 - 33.0 pg 32.2  MCHC Latest Ref Range: 31.5 - 35.7 g/dL 34.4  RDW Latest Ref Range: 12.3 - 15.4 % 12.8  Platelets Latest Ref Range: 150 - 450 x10E3/uL 179  Neutrophils Latest Ref Range: Not Estab. % 59  Immature Granulocytes Latest Ref Range: Not Estab. %  0  NEUT# Latest Ref Range: 1.4 - 7.0 x10E3/uL 3.2  Lymphocyte # Latest Ref Range: 0.7 - 3.1 x10E3/uL 1.6  Monocytes Absolute Latest Ref Range: 0.1 - 0.9 x10E3/uL 0.5  Basophils Absolute Latest Ref Range: 0.0 - 0.2 x10E3/uL 0.1  Immature Grans (Abs) Latest Ref Range: 0.0 - 0.1 x10E3/uL 0.0  Lymphs Latest Ref Range: Not Estab. % 29  Monocytes Latest Ref Range: Not Estab. % 9  Basos Latest Ref Range: Not Estab. % 1  Eos Latest Ref Range: Not Estab. % 2  EOS (ABSOLUTE) Latest Ref Range: 0.0 - 0.4 x10E3/uL 0.1      There is mild levoscoliosis and multilevel degenerative disc disease of the upper and mid lumbar spine. There is no compression fracture.   Electronically Signed ByLinton Rump On: 02/22/2015 10:33  Result Narrative  CLINICAL DATA:Several week history of low back pain without known injury, heavy lifting is performed day daily  EXAM: LUMBAR SPINE - 2-3 VIEW  COMPARISON:None.  FINDINGS: The lumbar vertebral bodies are preserved in height. There is grade 1 anterolisthesis of L3 with respect L2. No pars defects are demonstrated. There is high-grade disc space narrowing at L1-2 and L2-3. There is gentle levo curvature centered at the L1-L2 level. The pedicles and transverse processes are intact. The observed portions of the sacrum are normal.  Other Result Information  Interface, Rad Results In - 08/06/2017 11:41 AM EDT CLINICAL DATA:  Several week history of low back pain without known injury, heavy lifting is performed day daily  EXAM: LUMBAR SPINE - 2-3 VIEW  COMPARISON:  None.  FINDINGS: The lumbar vertebral bodies are  preserved in height. There is grade 1 anterolisthesis of L3 with respect L2. No pars defects are demonstrated. There is high-grade disc space narrowing at L1-2 and L2-3. There is gentle levo curvature centered at the L1-L2 level. The pedicles and transverse processes are intact. The observed portions of the sacrum are normal.  CONCLUSION: There is mild levoscoliosis and multilevel degenerative disc disease of the upper and mid lumbar spine. There is no compression fracture.   Electronically Signed   By: David  [place]   On: 02/22/2015 10:33    Assessment & Plan:  A-ventral hernia, abdomen pain, low back pain right with right radicular symptoms leg acute  P-Ventral hernia/bulge without abdominal crunch.  Patient unsure if this is worsening for him and this is first time I have noted on exam at rest sitting or supine.  Discussed with patient I recommended follow up with PCM/general surgeon if new pain with bulge.  This type of hernia can be fixed with surgery also just like his umbilical hernia/may or may not use mesh again depends on surgeon.  Unable to print CT abdomen results from Epic for patient sent to him electronically securely per his request.  If painful with or without vomiting/bloody stools seek re-evaluation same day discussed these are symptoms of incarcerated bowel and may need same day surgical intervention to prevent bowel damage or tissue death.  Discussed avoid heavy lifting/breath holding with lifting.  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  Prednisone can be irritating to stomach and elevates blood sugar levels so irritating to pancreas/more insulin needed to be produced.    Encouraged weight loss and avoid weight gain as this puts more pressure on abdominal muscles. 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    Exitcare handout on ventral hernia, enlarged spleen printed and given to patient. Patient verbalized  understanding  information/instructions, agreed with plan of care and had no further questions at this time.   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  Discussed some movement/lifting could have caused that to shift worsening his normal daily back pain and now having radicular symptoms.  Sometimes surgery is done for these type of problems to fuse joint.  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 Home stretches recommended to patient-e.g.lumbar/hip AROM gentle, knee to chest and rock side to side on back. Avoid prolonged static positions.  Self massage or professional prn, foam roller use or tennis/racquetball.  Heat/cryotherapy 15 minutes QID prn.  Consider thermacare patch.  Consider physical therapy referral if no improvement with prescribed therapy from [facility] and/or chiropractic care.  Ensure ergonomics correct desk at work avoid repetitive motions if possible/holding phone/laptop in hand use desk/stand and/or break up lifting items into smaller loads/weights.  Patient was instructed to rest, ice, and ROM exercises.  Activity as tolerated.   Follow up if symptoms persist or worsen especially if loss of bowel/bladder control, arm/leg weakness and/or saddle paresthesias.  Exitcare handout on low back pain with sciatica  rehab exercises given to patient.  Patient verbalized agreement and understanding of treatment plan and had no further questions at this time.  P2:  Injury Prevention and Fitness.  Telephone message left for patient at 1948 checking up on his pain/symptoms.  Stated I would call back again at 1955 since my number comes up private/blocked when dialing from home.  No answer to second call telephone message left for patient I would send him my chart message and call him at work tomorrow to follow up.

## 2020-03-04 ENCOUNTER — Telehealth: Payer: Self-pay | Admitting: Registered Nurse

## 2020-03-04 NOTE — Telephone Encounter (Signed)
Patient contacted via telephone.  He reported back pain same as usual upon awakening this am but doing the stretches helped today.  Patient wondering if he should have surgery on his back and hernia.  Discussed with patient he may find that physical therapy is helpful and may not need surgery but other option to consult with neurosurgeon. Patient asked that I resend his CT abdomen scan results to his gmail account as he did not receive in his work email today.  Patient will check his junk folder on Monday.   Patient reports ventral hernia/bulge still present but nontender.   Denied n/v/d/dysuria. Instructed patient to follow up with his general surgeon who performed his umbilical and incisional hernia repair previously.  Patient reported he would like a new Development worker, international aid.  Discussed with patient RN Rolly Salter can review network provider list with him next week.  Patient may get referral from me or PCM for general surgery, neurosurgery, PT.  Encouraged patient to talk with PCM at next follow up visit regarding his hernia, back pain also.  I recommend exercise, core strengthening and weight loss.  Patient to follow up if new or worsening symptoms for re-evaluation.  Patient verbalized understanding information/instructions, agreed with plan of care and had no further questions at this time.

## 2020-03-07 NOTE — Telephone Encounter (Signed)
Email sent to pt that included a list of general surgery offices and providers that are covered under his insurance and are accepting new patients.

## 2020-03-08 ENCOUNTER — Telehealth: Payer: Self-pay | Admitting: Registered Nurse

## 2020-03-08 ENCOUNTER — Other Ambulatory Visit: Payer: Self-pay

## 2020-03-08 NOTE — Telephone Encounter (Signed)
Patient reported pain improved with exercises and feeling much better with stretching before he gets out of bed in the morning.  Patient reported he received email from RN Rolly Salter with general surgery providers and he will review them.  He does think ventral hernia has enlarged not hurting today; po intake per usual along with voiding and stooling.  Patient given another bottle of prednisone 10mg  po daily #21 RF0 as shipment received and still awaiting shipment of potassium chloride to dispense his other bottles prior to vacation.  Patient has not counted his amlodipine yet to see if will need early fill to international travel in 30 days and will be gone for 30 days in planned.  Patient verbalized understanding information/instructions, agreed with plan of care and had no further questions at this time.

## 2020-03-08 NOTE — Telephone Encounter (Signed)
Spoke with patient in his workcenter today.  He received email from [employer] and will review list of providers for general surgery that are network for his insurance.

## 2020-03-08 NOTE — Telephone Encounter (Signed)
noted 

## 2020-03-10 NOTE — Telephone Encounter (Signed)
Followed up with patient today denied pain and reported he was going to get his second covid vaccination today.  He has not picked a general surgeon off list yet.  He did not have any questions or concerns today.

## 2020-03-21 ENCOUNTER — Other Ambulatory Visit: Payer: Self-pay | Admitting: Registered Nurse

## 2020-03-25 ENCOUNTER — Other Ambulatory Visit: Payer: Self-pay | Admitting: Nephrology

## 2020-03-25 DIAGNOSIS — I10 Essential (primary) hypertension: Secondary | ICD-10-CM

## 2020-03-25 DIAGNOSIS — N182 Chronic kidney disease, stage 2 (mild): Secondary | ICD-10-CM

## 2020-04-07 ENCOUNTER — Other Ambulatory Visit: Payer: Self-pay | Admitting: Urology

## 2020-04-14 ENCOUNTER — Telehealth: Payer: Self-pay | Admitting: Registered Nurse

## 2020-04-14 MED ORDER — AMLODIPINE BESYLATE 5 MG PO TABS: 5.0000 mg | ORAL_TABLET | Freq: Two times a day (BID) | ORAL | 0 refills | Status: DC

## 2020-04-14 NOTE — Telephone Encounter (Signed)
Patient has planned trip to [place] to visit family starting 08 May 2020 and return 08 Jun 2020.  Has received covid vaccinations.  Receives some of his medications from work [employer].  Amlodipine 5mg  po BID #45 RF0, prednisone 10mg  po daily #21 RF0 and atorvastatin 20mg  po daily #90 RF0 dispensed from [facility] to patient today as he reported he will run out during his trip of these medications when he counted pills at home.  Patient last dispense from [facility] formulary 03/03/2020  Prednisone 10mg  #84 atorvastatin 20mg  #90 Potassium Chloride #240 additional prednisone 10mg  #21 on 03/08/2020.  Patient reported he has plenty of potassium at home due to fill from 03/05/2020 also.  Discussed social distancing/mask wear and handwashing/sanitizing with patient during travel.  Patient also requested 1 bottle nasal saline prn use from clinic stock for seasonal allergic rhinitis  Patient verbalized understanding information/instructions, agreed with plan of care and had no further questions at this time.

## 2020-04-25 ENCOUNTER — Other Ambulatory Visit: Payer: Self-pay | Admitting: Nephrology

## 2020-04-26 ENCOUNTER — Other Ambulatory Visit: Payer: PRIVATE HEALTH INSURANCE

## 2020-04-28 ENCOUNTER — Encounter: Payer: Self-pay | Admitting: Registered Nurse

## 2020-04-28 ENCOUNTER — Other Ambulatory Visit: Payer: Self-pay

## 2020-04-28 ENCOUNTER — Ambulatory Visit: Payer: Self-pay | Admitting: Registered Nurse

## 2020-04-28 VITALS — BP 142/85 | HR 60 | Temp 96.7°F

## 2020-04-28 DIAGNOSIS — M25511 Pain in right shoulder: Secondary | ICD-10-CM

## 2020-04-28 DIAGNOSIS — R5383 Other fatigue: Secondary | ICD-10-CM

## 2020-04-28 NOTE — Progress Notes (Signed)
Subjective:    Patient ID: Ryan Glass, male    DOB: [birth year], 64 y.o.   MRN: [id number]  64y/o [place] established male pt c/o fatigue and reporting low oxygen readings on home pulse oximeter. States readings are fine in the morning 96% and above. In the afternoons last few days it has been reading 88-91%. Pt also reports fatigue and somnolence. Will sit at computer at work and after 2 minutes, has to get up and move around or feels like he is going to fall asleep. Going to bed around 2100 and wakes up 0400 as working early shift.  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  Eating lunch typically 1300 daily.  Shift ends 1400.  Takes his blood pressure at home but not every day.  Some blood pressure medication am dosing others afternoon/pm. BP  Log 04/15 1345 120/70 hr 56  03/26/2020@0700  133/82 hr 55; 04/25/2020 128/83 HR 58; 04/28/2020 0920 113/82 HR 59  Also c/o R shoulder pain. No known acute injury. Has noticed more painful reaching up to shoulder level; turning knobs on tubs at work and sliding doors on cabinets  Right hand dominant  Was started on methotrexate and daily prednisone oral in Feb 2021 per Epic by Rheumatology for inflammatory arthritis.  Patient also reports he is postponing his trip to [place] next week to Sept/October due to high Covid case rates there currently and low vaccination rate; businesses close at 8pm and known extended family/friends have been in hospital recently for 60 days.      Review of Systems  Constitutional: Positive for fatigue. Negative for activity change, appetite change, chills, diaphoresis and fever.  HENT: Negative for trouble swallowing and voice change.   Eyes: Negative for photophobia and visual disturbance.  Respiratory: Negative for cough, shortness of breath, wheezing and stridor.   Cardiovascular: Positive for palpitations and leg swelling. Negative for chest pain.  Gastrointestinal: Negative for  abdominal distention, abdominal pain, diarrhea, nausea and vomiting.  Endocrine: Negative for cold intolerance and heat intolerance.  Genitourinary: Negative for difficulty urinating.  Musculoskeletal: Positive for arthralgias and myalgias. Negative for gait problem, joint swelling, neck pain and neck stiffness.  Allergic/Immunologic: Positive for environmental allergies and immunocompromised state. Negative for food allergies.  Neurological: Negative for dizziness, tremors, seizures, syncope, facial asymmetry, speech difficulty, weakness, light-headedness, numbness and headaches.  Hematological: Negative for adenopathy. Does not bruise/bleed easily.  Psychiatric/Behavioral: Negative for agitation, confusion and sleep disturbance.       Objective:   Physical Exam Vitals and nursing note reviewed.  Constitutional:      General: He is awake.     Appearance: Normal appearance. He is well-developed and well-groomed. He is obese.  HENT:     Head: Normocephalic and atraumatic.     Jaw: There is normal jaw occlusion.     Salivary Glands: Right salivary gland is not diffusely enlarged or tender. Left salivary gland is not diffusely enlarged or tender.     Right Ear: Hearing and external ear normal.     Left Ear: Hearing and external ear normal.     Nose: Nose normal.     Mouth/Throat:     Mouth: No angioedema.     Pharynx: Oropharynx is clear.  Eyes:     General: Lids are normal. Vision grossly intact. Gaze aligned appropriately. Allergic shiner present. No visual field deficit or scleral icterus.       Right eye: No discharge.  Left eye: No discharge.     Extraocular Movements: Extraocular movements intact.     Conjunctiva/sclera: Conjunctivae normal.     Pupils: Pupils are equal, round, and reactive to light.  Neck:     Thyroid: No thyromegaly.     Trachea: Trachea and phonation normal.  Cardiovascular:     Rate and Rhythm: Normal rate and regular rhythm.     Pulses: Normal  pulses.          Radial pulses are 2+ on the right side and 2+ on the left side.     Heart sounds: Normal heart sounds.  Pulmonary:     Effort: Pulmonary effort is normal. No respiratory distress.     Breath sounds: Normal breath sounds and air entry. No stridor, decreased air movement or transmitted upper airway sounds. No decreased breath sounds, wheezing, rhonchi or rales.     Comments: Wearing disposable surgical mask due to covid 19 pandemic; spoke full sentences without difficulty; no cough observed in clinic; walked with patient back to dept and he did have some SOB with exertion "you walk really fast" Abdominal:     General: There is no distension.     Palpations: Abdomen is soft.  Musculoskeletal:        General: No swelling, tenderness or deformity.     Right shoulder: No swelling, deformity, effusion, laceration, tenderness, bony tenderness or crepitus. Decreased range of motion. Normal strength. Normal pulse.     Left shoulder: Normal. No swelling, deformity, effusion, laceration, tenderness, bony tenderness or crepitus. Normal range of motion. Normal strength. Normal pulse.     Right upper arm: Normal. No swelling, edema, deformity, lacerations, tenderness or bony tenderness.     Left upper arm: Normal.     Right elbow: Normal.     Left elbow: Normal.     Right hand: Normal.     Left hand: Normal.     Cervical back: Normal, normal range of motion and neck supple.     Thoracic back: Normal.     Lumbar back: Normal.     Right hip: Normal.     Left hip: Normal.     Right knee: Normal.     Left knee: Normal.     Right lower leg: 1+ Edema present.     Left lower leg: 1+ Edema present.     Comments: Slow to raise right hand from 45-75 degrees laterally (abduction); frontal flexion AROM normal/equal speed to left; slow to drop from 90 to 45 degrees; negative empty beer can/neers/atchley scratch tests; pain with adduction against resistance and without resistance; bicipital  groove/tendon/muscle right not TTP; AC joint/clavicle right not TTP  Lymphadenopathy:     Head:     Right side of head: No submental, submandibular or preauricular adenopathy.     Left side of head: No submental, submandibular or preauricular adenopathy.     Cervical: No cervical adenopathy.     Right cervical: No superficial cervical adenopathy.    Left cervical: No superficial cervical adenopathy.  Skin:    General: Skin is warm and dry.     Capillary Refill: Capillary refill takes less than 2 seconds.     Coloration: Skin is not ashen, cyanotic, jaundiced, mottled, pale or sallow.     Findings: No abrasion, abscess, acne, bruising, burn, ecchymosis, erythema, signs of injury, laceration, lesion, petechiae, rash or wound.     Nails: There is no clubbing.  Neurological:     General: No focal deficit present.  Mental Status: He is alert and oriented to person, place, and time. Mental status is at baseline.     GCS: GCS eye subscore is 4. GCS verbal subscore is 5. GCS motor subscore is 6.     Cranial Nerves: Cranial nerves are intact. No cranial nerve deficit, dysarthria or facial asymmetry.     Sensory: Sensation is intact. No sensory deficit.     Motor: Motor function is intact. No weakness, tremor, atrophy, abnormal muscle tone or seizure activity.     Coordination: Coordination is intact. Coordination normal.     Gait: Gait is intact. Gait normal.     Comments: Gait sure and steady in clinic; in/out of chair without difficulty; bilateral hand grasp equal 5/5  Psychiatric:        Attention and Perception: Attention and perception normal.        Mood and Affect: Mood and affect normal.        Speech: Speech normal.        Behavior: Behavior normal. Behavior is cooperative.        Thought Content: Thought content normal.        Cognition and Memory: Cognition and memory normal.        Judgment: Judgment normal.     sp02 did not drop below 96% on room air with continuous  monitoring in clinic during history and physical.  Patient brought his unit and it correlated with clinic equipment.      Assessment & Plan:  A-fatigue, acute right shoulder pain initial visit  P-Discussed could be blood pressure related as had  brain fog/fatigue when blood pressure/heart rate 110s/50s and HR 50s last year.  Will change BP checks to afternoon when fatigue worse as am readings stable and not in range of previous symptoms.  Patient denied any worsening of palpitations recently and noticing them less currently.  Never did buy app to monitor on phone recommended by cardiology.  Last labs this winter.  Denied palpitations and exam today no irregular HR.  Methotrexate per up to date can cause fatigue and last CBC May 2020 (no hx anemia or abnormal TSH per chart review paper and Epic)  CMET Nov 2020 discussed with patient can draw executive panel next week if no abnormal sp02 and bp/hr checks at goal and not low.  Patient reported he has been taking methotrexate for about a month now steady.  Discussed with patient common side effects of methotrexate per up to date.  Encouraged to continue scheduling 8 hours sleep per night to avoid sleep deprivation and wearing his cpap as history of sleep apnea which can also contribute to fatigue.  Exitcare handouts printed and given on living with sleep apnea, fatigue, and methotrexate oral.  Patient verbalized understanding information/instructions, agreed with plan of care and had no further questions at this time.  He will follow up in 1 week with blood pressure log afternoon/evening or when symptomatic measurements sp02/HR/BP for re-evaluation.   Shoulder pain start home exercise program with exercises from shoulder impingement rehab, shoulder pain and shoulder impingement syndrome exitcare printed and given to patient.  Encouraged him to schedule with [employer] office (network provider for patient insurance) for further evaluation and  treatment shoulder impingement symptoms. Discussed probable xrays/MRI/possible injection/PT typical course conservative care. Patient was instructed to rest at home from repetitive motions/impact to right hand/arm, ice/cryotherapy 15 minutes TID prn pain/swelling, and ROM exercises. Activity as tolerated. Patient has rx for tylenol #3 prn use pain and  I recommended biofreeze/tylenol 1000mg  po every 6 hours as needed for pain instead of NSAIDS at this time. Follow up if symptoms persist or worsen while waiting for orthopedics appt/scheduling. Patient verbalized agreement and understanding of treatment plan and had no further questions at this time. P2: Injury Prevention and Fitness.

## 2020-04-28 NOTE — Patient Instructions (Addendum)
Living With Sleep Apnea Sleep apnea is a condition in which breathing pauses or becomes shallow during sleep. Sleep apnea is most commonly caused by a collapsed or blocked airway. People with sleep apnea snore loudly and have times when they gasp and stop breathing for 10 seconds or more during sleep. This happens over and over during the night. This disrupts your sleep and keeps your body from getting the rest that it needs, which can cause tiredness and lack of energy (fatigue) during the day. The breaks in breathing also interrupt the deep sleep that you need to feel rested. Even if you do not completely wake up from the gaps in breathing, your sleep may not be restful. You may also have a headache in the morning and low energy during the day, and you may feel anxious or depressed. How can sleep apnea affect me? Sleep apnea increases your chances of extreme tiredness during the day (daytime fatigue). It can also increase your risk for health conditions, such as:  Heart attack.  Stroke.  Diabetes.  Heart failure.  Irregular heartbeat.  High blood pressure. If you have daytime fatigue as a result of sleep apnea, you may be more likely to:  Perform poorly at school or work.  Fall asleep while driving.  Have difficulty with attention.  Develop depression or anxiety.  Become severely overweight (obese).  Have sexual dysfunction. What actions can I take to manage sleep apnea? Sleep apnea treatment   If you were given a device to open your airway while you sleep, use it only as told by your health care provider. You may be given: ? An oral appliance. This is a custom-made mouthpiece that shifts your lower jaw forward. ? A continuous positive airway pressure (CPAP) device. This device blows air through a mask when you breathe out (exhale). ? A nasal expiratory positive airway pressure (EPAP) device. This device has valves that you put into each nostril. ? A bi-level positive airway  pressure (BPAP) device. This device blows air through a mask when you breathe in (inhale) and breathe out (exhale).  You may need surgery if other treatments do not work for you. Sleep habits  Go to sleep and wake up at the same time every day. This helps set your internal clock (circadian rhythm) for sleeping. ? If you stay up later than usual, such as on weekends, try to get up in the morning within 2 hours of your normal wake time.  Try to get at least 7-9 hours of sleep each night.  Stop computer, tablet, and mobile phone use a few hours before bedtime.  Do not take long naps during the day. If you nap, limit it to 30 minutes.  Have a relaxing bedtime routine. Reading or listening to music may relax you and help you sleep.  Use your bedroom only for sleep. ? Keep your television and computer out of your bedroom. ? Keep your bedroom cool, dark, and quiet. ? Use a supportive mattress and pillows.  Follow your health care provider's instructions for other changes to sleep habits. Nutrition  Do not eat heavy meals in the evening.  Do not have caffeine in the later part of the day. The effects of caffeine can last for more than 5 hours.  Follow your health care provider's or dietitian's instructions for any diet changes. Lifestyle      Do not drink alcohol before bedtime. Alcohol can cause you to fall asleep at first, but then it can cause  you to wake up in the middle of the night and have trouble getting back to sleep.  Do not use any products that contain nicotine or tobacco, such as cigarettes and e-cigarettes. If you need help quitting, ask your health care provider. Medicines  Take over-the-counter and prescription medicines only as told by your health care provider.  Do not use over-the-counter sleep medicine. You can become dependent on this medicine, and it can make sleep apnea worse.  Do not use medicines, such as sedatives and narcotics, unless told by your health  care provider. Activity  Exercise on most days, but avoid exercising in the evening. Exercising near bedtime can interfere with sleeping.  If possible, spend time outside every day. Natural light helps regulate your circadian rhythm. General information  Lose weight if you need to, and maintain a healthy weight.  Keep all follow-up visits as told by your health care provider. This is important.  If you are having surgery, make sure to tell your health care provider that you have sleep apnea. You may need to bring your device with you. Where to find more information Learn more about sleep apnea and daytime fatigue from:  American Sleep Association: sleepassociation.[place]: sleepfoundation.org  National Heart, Lung, and Blood Institute: https://www.[place].biz/ Summary  Sleep apnea can cause daytime fatigue and other serious health conditions.  Both sleep apnea and daytime fatigue can be bad for your health and well-being.  You may need to wear a device while sleeping to help keep your airway open.  If you are having surgery, make sure to tell your health care provider that you have sleep apnea. You may need to bring your device with you.  Making changes to sleep habits, diet, lifestyle, and activity can help you manage sleep apnea. This information is not intended to replace advice given to you by your health care provider. Make sure you discuss any questions you have with your health care provider. Document Revised: 03/20/2019 Document Reviewed: 02/20/2018 Elsevier Patient Education  [place]. Fatigue If you have fatigue, you feel tired all the time and have a lack of energy or a lack of motivation. Fatigue may make it difficult to start or complete tasks because of exhaustion. In general, occasional or mild fatigue is often a normal response to activity or life. However, long-lasting (chronic) or extreme fatigue may be a symptom of a medical  condition. Follow these instructions at home: General instructions  Watch your fatigue for any changes.  Go to bed and get up at the same time every day.  Avoid fatigue by pacing yourself during the day and getting enough sleep at night.  Maintain a healthy weight. Medicines  Take over-the-counter and prescription medicines only as told by your health care provider.  Take a multivitamin, if told by your health care provider.  Do not use herbal or dietary supplements unless they are approved by your health care provider. Activity   Exercise regularly, as told by your health care provider.  Use or practice techniques to help you relax, such as yoga, tai chi, meditation, or massage therapy. Eating and drinking   Avoid heavy meals in the evening.  Eat a well-balanced diet, which includes lean proteins, whole grains, plenty of fruits and vegetables, and low-fat dairy products.  Avoid consuming too much caffeine.  Avoid the use of alcohol.  Drink enough fluid to keep your urine pale yellow. Lifestyle  Change situations that cause you stress. Try to keep your  work and personal schedule in balance.  Do not use any products that contain nicotine or tobacco, such as cigarettes and e-cigarettes. If you need help quitting, ask your health care provider.  Do not use drugs. Contact a health care provider if:  Your fatigue does not get better.  You have a fever.  You suddenly lose or gain weight.  You have headaches.  You have trouble falling asleep or sleeping through the night.  You feel angry, guilty, anxious, or sad.  You are unable to have a bowel movement (constipation).  Your skin is dry.  You have swelling in your legs or another part of your body. Get help right away if:  You feel confused.  Your vision is blurry.  You feel faint or you pass out.  You have a severe headache.  You have severe pain in your abdomen, your back, or the area between your  waist and hips (pelvis).  You have chest pain, shortness of breath, or an irregular or fast heartbeat.  You are unable to urinate, or you urinate less than normal.  You have abnormal bleeding, such as bleeding from the rectum, vagina, nose, lungs, or nipples.  You vomit blood.  You have thoughts about hurting yourself or others. If you ever feel like you may hurt yourself or others, or have thoughts about taking your own life, get help right away. You can go to your nearest emergency department or call:  Your local emergency services (911 in the [place]  A suicide crisis helpline, such as the [place] at 910-449-8487. This is open 24 hours a day. Summary  If you have fatigue, you feel tired all the time and have a lack of energy or a lack of motivation.  Fatigue may make it difficult to start or complete tasks because of exhaustion.  Long-lasting (chronic) or extreme fatigue may be a symptom of a medical condition.  Exercise regularly, as told by your health care provider.  Change situations that cause you stress. Try to keep your work and personal schedule in balance. This information is not intended to replace advice given to you by your health care provider. Make sure you discuss any questions you have with your health care provider. Document Revised: 06/17/2019 Document Reviewed: 08/21/2017 Elsevier Patient Education  [place]. Shoulder Pain Many things can cause shoulder pain, including:  An injury to the shoulder.  Overuse of the shoulder.  Arthritis. The source of the pain can be:  Inflammation.  An injury to the shoulder joint.  An injury to a tendon, ligament, or bone. Follow these instructions at home: Pay attention to changes in your symptoms. Let your health care provider know about them. Follow these instructions to relieve your pain. If you have a sling:  Wear the sling as told by your health care provider. Remove  it only as told by your health care provider.  Loosen the sling if your fingers tingle, become numb, or turn cold and blue.  Keep the sling clean.  If the sling is not waterproof: ? Do not let it get wet. Remove it to shower or bathe.  Move your arm as little as possible, but keep your hand moving to prevent swelling. Managing pain, stiffness, and swelling   If directed, put ice on the painful area: ? Put ice in a plastic bag. ? Place a towel between your skin and the bag. ? Leave the ice on for 20 minutes, 2-3 times per day.  Stop applying ice if it does not help with the pain.  Squeeze a soft ball or a foam pad as much as possible. This helps to keep the shoulder from swelling. It also helps to strengthen the arm. General instructions  Take over-the-counter and prescription medicines only as told by your health care provider.  Keep all follow-up visits as told by your health care provider. This is important. Contact a health care provider if:  Your pain gets worse.  Your pain is not relieved with medicines.  New pain develops in your arm, hand, or fingers. Get help right away if:  Your arm, hand, or fingers: ? Tingle. ? Become numb. ? Become swollen. ? Become painful. ? Turn white or blue. Summary  Shoulder pain can be caused by an injury, overuse, or arthritis.  Pay attention to changes in your symptoms. Let your health care provider know about them.  This condition may be treated with a sling, ice, and pain medicines.  Contact your health care provider if the pain gets worse or new pain develops. Get help right away if your arm, hand, or fingers tingle or become numb, swollen, or painful.  Keep all follow-up visits as told by your health care provider. This is important. This information is not intended to replace advice given to you by your health care provider. Make sure you discuss any questions you have with your health care provider. Document Revised:  06/10/2018 Document Reviewed: 06/10/2018 Elsevier Patient Education  [place]. Shoulder Impingement Syndrome Rehab Ask your health care provider which exercises are safe for you. Do exercises exactly as told by your health care provider and adjust them as directed. It is normal to feel mild stretching, pulling, tightness, or discomfort as you do these exercises. Stop right away if you feel sudden pain or your pain gets worse. Do not begin these exercises until told by your health care provider. Stretching and range-of-motion exercise This exercise warms up your muscles and joints and improves the movement and flexibility of your shoulder. This exercise also helps to relieve pain and stiffness. Passive horizontal adduction In passive adduction, you use your other hand to move the injured arm toward your body. The injured arm does not move on its own. In this movement, your arm is moved across your body in the horizontal plane (horizontal adduction). 1. Sit or stand and pull your left / right elbow across your chest, toward your other shoulder. Stop when you feel a gentle stretch in the back of your shoulder and upper arm. ? Keep your arm at shoulder height. ? Keep your arm as close to your body as you comfortably can. 2. Hold for ____5-15______ seconds. 3. Slowly return to the starting position. Repeat ______3____ times. Complete this exercise ____3______ times a day. Strengthening exercises These exercises build strength and endurance in your shoulder. Endurance is the ability to use your muscles for a long time, even after they get tired. External rotation, isometric This is an exercise in which you press the back of your wrist against a door frame without moving your shoulder joint (isometric). 1. Stand or sit in a doorway, facing the door frame. 2. Bend your left / right elbow and place the back of your wrist against the door frame. Only the back of your wrist should be touching the  frame. Keep your upper arm at your side. 3. Gently press your wrist against the door frame, as if you are trying to push your arm away  from your abdomen (external rotation). Press as hard as you are able without pain. ? Avoid shrugging your shoulder while you press your wrist against the door frame. Keep your shoulder blade tucked down toward the middle of your back. 4. Hold for ___5-15_______ seconds. 5. Slowly release the tension, and relax your muscles completely before you repeat the exercise. Repeat ____3______ times. Complete this exercise ____3______ times a day. Internal rotation, isometric This is an exercise in which you press your palm against a door frame without moving your shoulder joint (isometric). 1. Stand or sit in a doorway, facing the door frame. 2. Bend your left / right elbow and place the palm of your hand against the door frame. Only your palm should be touching the frame. Keep your upper arm at your side. 3. Gently press your hand against the door frame, as if you are trying to push your arm toward your abdomen (internal rotation). Press as hard as you are able without pain. ? Avoid shrugging your shoulder while you press your hand against the door frame. Keep your shoulder blade tucked down toward the middle of your back. 4. Hold for ___5-15_______ seconds. 5. Slowly release the tension, and relax your muscles completely before you repeat the exercise. Repeat _____3_____ times. Complete this exercise _____3_____ times a day. Scapular protraction, supine  1. Lie on your back on a firm surface (supine position). Hold a _____none_____ weight in your left / right hand. 2. Raise your left / right arm straight into the air so your hand is directly above your shoulder joint. 3. Push the weight into the air so your shoulder (scapula) lifts off the surface that you are lying on. The scapula will push up or forward (protraction). Do not move your head, neck, or back. 4. Hold for  __5-15________ seconds. 5. Slowly return to the starting position. Let your muscles relax completely before you repeat this exercise. Repeat ___3_______ times. Complete this exercise ____3______ times a day. Scapular retraction  1. Sit in a stable chair without armrests, or stand up. 2. Secure an exercise band to a stable object in front of you so the band is at shoulder height. 3. Hold one end of the exercise band in each hand. Your palms should face down. 4. Squeeze your shoulder blades together (retraction) and move your elbows slightly behind you. Do not shrug your shoulders upward while you do this. 5. Hold for ____5-15______ seconds. 6. Slowly return to the starting position. Repeat _____3_____ times. Complete this exercise _______3___ times a day. Shoulder extension  1. Sit in a stable chair without armrests, or stand up. 2. Secure an exercise band to a stable object in front of you so the band is above shoulder height. 3. Hold one end of the exercise band in each hand. 4. Straighten your elbows and lift your hands up to shoulder height. 5. Squeeze your shoulder blades together and pull your hands down to the sides of your thighs (extension). Stop when your hands are straight down by your sides. Do not let your hands go behind your body. 6. Hold for __5-15______ seconds. 7. Slowly return to the starting position. Repeat _____3_____ times. Complete this exercise ______3____ times a day. This information is not intended to replace advice given to you by your health care provider. Make sure you discuss any questions you have with your health care provider. Document Revised: 03/20/2019 Document Reviewed: 12/22/2018 Elsevier Patient Education  [place]. Shoulder Impingement Syndrome  Shoulder impingement syndrome  is a condition that causes pain when connective tissues (tendons) surrounding the shoulder joint become pinched. These tendons are part of the group of muscles and  tissues that help to stabilize the shoulder (rotator cuff). Beneath the rotator cuff is a fluid-filled sac (bursa) that allows the muscles and tendons to glide smoothly. The bursa may become swollen or irritated (bursitis). Bursitis, swelling in the rotator cuff tendons, or both conditions can decrease how much space is under a bone in the shoulder joint (acromion), resulting in impingement. What are the causes? Shoulder impingement syndrome may be caused by bursitis or swelling of the rotator cuff tendons, which may result from:  Repetitive overhead arm movements.  Falling onto the shoulder.  Weakness in the shoulder muscles. What increases the risk? You may be more likely to develop this condition if you:  Play sports that involve throwing, such as baseball.  Participate in sports such as tennis, volleyball, and swimming.  Work as a Curator, Games developer, or Architect. Some people are also more likely to develop impingement syndrome because of the shape of their acromion bone. What are the signs or symptoms? The main symptom of this condition is pain on the front or side of the shoulder. The pain may:  Get worse when lifting or raising the arm.  Get worse at night.  Wake you up from sleeping.  Feel sharp when the shoulder is moved and then fade to an ache. Other symptoms may include:  Tenderness.  Stiffness.  Inability to raise the arm above shoulder level or behind the body.  Weakness. How is this diagnosed? This condition may be diagnosed based on:  Your symptoms and medical history.  A physical exam.  Imaging tests, such as: ? X-rays. ? MRI. ? Ultrasound. How is this treated? This condition may be treated by:  Resting your shoulder and avoiding all activities that cause pain or put stress on the shoulder.  Icing your shoulder.  NSAIDs to help reduce pain and swelling.  One or more injections of medicines to numb the area and reduce  inflammation.  Physical therapy.  Surgery. This may be needed if nonsurgical treatments have not helped. Surgery may involve repairing the rotator cuff, reshaping the acromion, or removing the bursa. Follow these instructions at home: Managing pain, stiffness, and swelling   If directed, put ice on the injured area. ? Put ice in a plastic bag. ? Place a towel between your skin and the bag. ? Leave the ice on for 20 minutes, 2-3 times a day. Activity  Rest and return to your normal activities as told by your health care provider. Ask your health care provider what activities are safe for you.  Do exercises as told by your health care provider. General instructions  Do not use any products that contain nicotine or tobacco, such as cigarettes, e-cigarettes, and chewing tobacco. These can delay healing. If you need help quitting, ask your health care provider.  Ask your health care provider when it is safe for you to drive.  Take over-the-counter and prescription medicines only as told by your health care provider.  Keep all follow-up visits as told by your health care provider. This is important. How is this prevented?  Give your body time to rest between periods of activity.  Be safe and responsible while being active. This will help you avoid falls.  Maintain physical fitness, including strength and flexibility. Contact a health care provider if:  Your symptoms have not improved after 1-2  months of treatment and rest.  You cannot lift your arm away from your body. Summary  Shoulder impingement syndrome is a condition that causes pain when connective tissues (tendons) surrounding the shoulder joint become pinched.  The main symptom of this condition is pain on the front or side of the shoulder.  This condition is usually treated with rest, ice, and pain medicines as needed. This information is not intended to replace advice given to you by your health care provider. Make  sure you discuss any questions you have with your health care provider. Document Revised: 03/20/2019 Document Reviewed: 05/21/2018 Elsevier Patient Education  [place]. Methotrexate tablets What is this medicine? METHOTREXATE (METH oh TREX ate) is a chemotherapy drug used to treat cancer including breast cancer, leukemia, and lymphoma. This medicine can also be used to treat psoriasis and certain kinds of arthritis. This medicine may be used for other purposes; ask your health care provider or pharmacist if you have questions. COMMON BRAND NAME(S): Rheumatrex, Trexall What should I tell my health care provider before I take this medicine? They need to know if you have any of these conditions:  fluid in the stomach area or lungs  if you often drink alcohol  infection or immune system problems  kidney disease or on hemodialysis  liver disease  low blood counts, like low white cell, platelet, or red cell counts  lung disease  radiation therapy  stomach ulcers  ulcerative colitis  an unusual or allergic reaction to methotrexate, other medicines, foods, dyes, or preservatives  pregnant or trying to get pregnant  breast-feeding How should I use this medicine? Take this medicine by mouth with a glass of water. Follow the directions on the prescription label. Take your medicine at regular intervals. Do not take it more often than directed. Do not stop taking except on your doctor's advice. Make sure you know why you are taking this medicine and how often you should take it. If this medicine is used for a condition that is not cancer, like arthritis or psoriasis, it should be taken weekly, NOT daily. Taking this medicine more often than directed can cause serious side effects, even death. Talk to your healthcare provider about safe handling and disposal of this medicine. You may need to take special precautions. Talk to your pediatrician regarding the use of this medicine in  children. While this drug may be prescribed for selected conditions, precautions do apply. Overdosage: If you think you have taken too much of this medicine contact a poison control center or emergency room at once. NOTE: This medicine is only for you. Do not share this medicine with others. What if I miss a dose? If you miss a dose, talk with your doctor or health care professional. Do not take double or extra doses. What may interact with this medicine? This medicine may interact with the following medication:  acitretin  aspirin and aspirin-like medicines including salicylates  azathioprine  certain antibiotics like penicillins, tetracycline, and chloramphenicol  cyclosporine  gold  hydroxychloroquine  live virus vaccines  NSAIDs, medicines for pain and inflammation, like ibuprofen or naproxen  other cytotoxic agents  penicillamine  phenylbutazone  phenytoin  probenecid  retinoids such as isotretinoin and tretinoin  steroid medicines like prednisone or cortisone  sulfonamides like sulfasalazine and trimethoprim/sulfamethoxazole  theophylline This list may not describe all possible interactions. Give your health care provider a list of all the medicines, herbs, non-prescription drugs, or dietary supplements you use. Also tell them if  you smoke, drink alcohol, or use illegal drugs. Some items may interact with your medicine. What should I watch for while using this medicine? Avoid alcoholic drinks. This medicine can make you more sensitive to the sun. Keep out of the sun. If you cannot avoid being in the sun, wear protective clothing and use sunscreen. Do not use sun lamps or tanning beds/booths. You may need blood work done while you are taking this medicine. Call your doctor or health care professional for advice if you get a fever, chills or sore throat, or other symptoms of a cold or flu. Do not treat yourself. This drug decreases your body's ability to fight  infections. Try to avoid being around people who are sick. This medicine may increase your risk to bruise or bleed. Call your doctor or health care professional if you notice any unusual bleeding. Check with your doctor or health care professional if you get an attack of severe diarrhea, nausea and vomiting, or if you sweat a lot. The loss of too much body fluid can make it dangerous for you to take this medicine. Talk to your doctor about your risk of cancer. You may be more at risk for certain types of cancers if you take this medicine. Both men and women must use effective birth control with this medicine. Do not become pregnant while taking this medicine or until at least 1 normal menstrual cycle has occurred after stopping it. Women should inform their doctor if they wish to become pregnant or think they might be pregnant. Men should not father a child while taking this medicine and for 3 months after stopping it. There is a potential for serious side effects to an unborn child. Talk to your health care professional or pharmacist for more information. Do not breast-feed an infant while taking this medicine. What side effects may I notice from receiving this medicine? Side effects that you should report to your doctor or health care professional as soon as possible:  allergic reactions like skin rash, itching or hives, swelling of the face, lips, or tongue  breathing problems or shortness of breath  diarrhea  dry, nonproductive cough  low blood counts - this medicine may decrease the number of white blood cells, red blood cells and platelets. You may be at increased risk for infections and bleeding.  mouth sores  redness, blistering, peeling or loosening of the skin, including inside the mouth  signs of infection - fever or chills, cough, sore throat, pain or trouble passing urine  signs and symptoms of bleeding such as bloody or black, tarry stools; red or dark-brown urine; spitting up  blood or brown material that looks like coffee grounds; red spots on the skin; unusual bruising or bleeding from the eye, gums, or nose  signs and symptoms of kidney injury like trouble passing urine or change in the amount of urine  signs and symptoms of liver injury like dark yellow or brown urine; general ill feeling or flu-like symptoms; light-colored stools; loss of appetite; nausea; right upper belly pain; unusually weak or tired; yellowing of the eyes or skin Side effects that usually do not require medical attention (report to your doctor or health care professional if they continue or are bothersome):  dizziness  hair loss  tiredness  upset stomach  vomiting This list may not describe all possible side effects. Call your doctor for medical advice about side effects. You may report side effects to FDA at 1-800-FDA-1088. Where should I keep my  medicine? Keep out of the reach of children. Store at room temperature between 20 and 25 degrees C (68 and 77 degrees F). Protect from light. Throw away any unused medicine after the expiration date. NOTE: This sheet is a summary. It may not cover all possible information. If you have questions about this medicine, talk to your doctor, pharmacist, or health care provider.  2020 Elsevier/Gold Standard (2017-07-18 13:38:43)

## 2020-05-03 ENCOUNTER — Telehealth: Payer: Self-pay | Admitting: Registered Nurse

## 2020-05-03 ENCOUNTER — Encounter: Payer: Self-pay | Admitting: Registered Nurse

## 2020-05-03 NOTE — Telephone Encounter (Signed)
Patient seen at his desk in workcenter today.  Has not been checking BP in afternoon as requested.  Still feeling tired/fatigued.  Has been checking spo2 stated high 80s to low 90s over weekend with his home monitor.  Today 95% sp02 room air and HR 65.  Patient thinks he has appt with rheumatology at [facility] today unsure of time reviewed epic no time noted in care everywhere instructed patient to call provider office to verify date/time of appt as care everywhere stated 05/02/2020 for rheumatology.  Patient will check his blood pressure in the afternoon along with pulse over the next week and bring log to clinic for me to review next week.  Discussed if BP and pulse normal e.g. 120s/70s and HR 60s will check labs if none drawn with rheumatology appt as due CBC  See office note last week.  Patient verbalized understanding information/instructions, agreed with plan of care and had no further questions at this time.

## 2020-05-10 NOTE — Telephone Encounter (Signed)
Patient on vacation this week per coworker.  Attempted to follow up with him at desk for BP log readings from last week today.

## 2020-05-12 ENCOUNTER — Telehealth: Payer: Self-pay

## 2020-05-12 ENCOUNTER — Encounter: Payer: Self-pay | Admitting: Registered Nurse

## 2020-05-12 NOTE — Telephone Encounter (Signed)
Reviewed blood pressure pm log I requested patient to completed started 5/26-05/11/2020 19-2130 readings with BP cuff automatic and sp02 fingertip monitor  105-165/58-89 HR 35-80 and sp02 room air  91-96 HR 31-69.  Patient sent log to his cardiology provider today also and she reviewed and did not recommend medication changes today.  He is due to follow up with her in September if feeling well.  SBP goal 110s per her note.  My chart message sent to patient as he is offsite this week on vacation per coworker.  RN Rolly Salter to schedule appt with patient onsite next week to check for irregular HR/palpitations/BP while at work and sp02 verification.

## 2020-05-12 NOTE — Telephone Encounter (Signed)
I spoke to the patient and informed him that Dayna thought that his BP was great.  I advised him to call his PCP and let them know of his present symptoms.  He verbalized understanding.

## 2020-05-17 NOTE — Telephone Encounter (Signed)
Patient seen in workcenter.  He reported had follow up with rheumatology last week Thursday and provider trying to get insurance approval for injectable medication to treat his condition.  His rheumatologist also notified him of slight anemia.  Patient reported he has noted when heart rate slow increased frequency of palpitations.  Blood pressure reading unchanged over the weekend.  Discussed with patient slow heartrate, low BP and anemia may all be making him feel tired/fatigued.  Reinforced with patient if symptomatic or worsening symptoms/palpitations to ensure he notifies his cardiology provider.  Patient asked for me to review his labs in computer if able to see rheumatology results.  Notified patient I would do so when I returned to my workstation and would email him if any concerns or follow up instructions.  Patient verbalized understanding information/instructions, agreed with plan of care and had no further questions at this time.

## 2020-05-17 NOTE — Telephone Encounter (Signed)
Mild anemia noted on rheumatology labs.  Email sent to patient notifying him his methotrexate can cause anemia but also wanted to make sure he is not having changes in stools e.g. bright red blood, black, tarry or coffee ground stools.  Clinic closed tomorrow but will be onsite again 05/19/2020 and follow up with patient.  Noted rheumatology seeking preapproval for enbrel use (injection) per Dr Herma Carson note care everywhere and follow up in 3 months.

## 2020-05-19 NOTE — Telephone Encounter (Signed)
Followed up with patient at workcenter today.  Denied changes in bowel habits/consistency/color.  Tired/sleepy yesterday driving home after work and when checked sp02 noted HR slow at 40. Patient has noticed when he is feeling very sleepy his heart rate tends to be slower. Discussed with patient to continue symptom log along with heart rate, spo2 and BP and share with [place] and cardiology team especially if symptoms daily.  Discussed iron rich foods, encouraged him to have iron rich food e.g. meat, sardines, haddock, perch, tuna, salmon, oysters, mussels, nuts, broccoli, dried apricots/peaches/plums, beans/lentils, iron fortified cereals/breads, wheat germ, egg noodles enriched cooked, pumpkin,sesame,squash seeds, tofu every meal and per up to date 4-10% patients on methotrexate develop anemia.  Keep scheduled follow up with rheumatology 3 months.  Still awaiting approval from insurance for enbrel.  Discussed with patient chronic diseases, gout, rheumatoid arthritis can cause elevated ESR.  Patient verbalized understanding information/instructions, agreed with plan of care and had no further questions at this time.

## 2020-05-24 NOTE — Telephone Encounter (Signed)
Patient telephoned clinic today stated BP good this weekend 119/59 not as tired/fatigued, continuing to keep log.  Denied palpitation worsening from baseline, syncope, LOC, n/v/d.  Reinforced with patient to continue daily BP/pulse log and to write next to BP if having any symptoms e.g. fatigue, sleepy, palpitations, dizzy, SOB.  Patient stated he would continue log and add symptoms when occur and share with his cardiology provider.  Patient did not have any concerns today feeling well.  Patient verbalized understanding information/instructions and had no further questions at this time.

## 2020-05-26 ENCOUNTER — Other Ambulatory Visit: Payer: Self-pay | Admitting: Registered Nurse

## 2020-06-07 ENCOUNTER — Encounter (HOSPITAL_BASED_OUTPATIENT_CLINIC_OR_DEPARTMENT_OTHER): Payer: Self-pay | Admitting: Urology

## 2020-06-08 ENCOUNTER — Encounter (HOSPITAL_BASED_OUTPATIENT_CLINIC_OR_DEPARTMENT_OTHER): Payer: Self-pay | Admitting: Urology

## 2020-06-08 ENCOUNTER — Other Ambulatory Visit: Payer: Self-pay

## 2020-06-08 NOTE — Progress Notes (Signed)
Spoke w/ via phone for pre-op interview--- PT Lab needs dos---- Istat               Lab results------ current ekg dated 09-14-2019 in epic/ chart COVID test ------ 06-10-2020 @ 1505 Arrive at ------- 1015 NPO after ------ MN w/ exception clear liquids until 0815 then nothing by mouth Medications to take morning of surgery ----- Gabapentin, Hydralazine, Norvasc, Prednisone w/ sips of water Diabetic medication ----- n/a Patient Special Instructions ----- n/a Pre-Op special Istructions ----- n/a Patient verbalized understanding of instructions that were given at this phone interview. Patient denies shortness of breath, chest pain, fever, cough a this phone interview.   Anesthesia Review:  Hx PVCs s/p ablation 2019;  HTN, OSA with cpap ; RA; Chronic gout;  Chart to be reviewed by anesthesia.  PCP: Dr Elvera Lennox. Braxton Feathers  04-28-2020 epic) Cardiologist : Dr Karie Schwalbe. Turner Theron Arista 10-26-2019 epic) Rheumatology: Dr. Ulis Rias (lov 05-12-2020 epic) Nephrology:  Virgina Norfolk PA (lov 10-29-2019 scanned in media) Chest x-ray :  CTA 10-09-2019 epic EKG : 09-14-2019 epic Echo : 10-19-2019 epic Event monitor:  08-07-2019 epic Stress test:  Nuclear 05-14-2018 epic Cardiac Cath : no Activity level:  States no sob with normal activities / stairs , only gets sob if run or go up stairs fast Sleep Study/ CPAP : YES/YES Fasting Blood Sugar :      / Checks Blood Sugar -- times a day:   N/A Blood Thinner/ Instructions Maurice Small Dose: NO ASA / Instructions/ Last Dose :  NO

## 2020-06-09 NOTE — Progress Notes (Signed)
Spoke with angela kabbe np ok to proceed

## 2020-06-09 NOTE — Anesthesia Preprocedure Evaluation (Addendum)
Anesthesia Evaluation  Patient identified by MRN, date of birth, ID band Patient awake    Reviewed: Allergy & Precautions, NPO status , Patient's Chart, lab work & pertinent test results, reviewed documented beta blocker date and time   Airway Mallampati: II  TM Distance: >3 FB Neck ROM: Full    Dental no notable dental hx. (+) Teeth Intact, Caps   Pulmonary sleep apnea and Continuous Positive Airway Pressure Ventilation , COPD,    Pulmonary exam normal breath sounds clear to auscultation       Cardiovascular hypertension, Pt. on medications and Pt. on home beta blockers Normal cardiovascular exam+ dysrhythmias  Rhythm:Regular Rate:Normal  Hx/o PVC's S/P ablation with recurrence  Ascending Aortic aneurysm Stable at 21mm  EKG 09/24/19 Sinus bradycardia, 1st degree AV block, PVC's, right axis  Echo 10/19/19 1. Left ventricular ejection fraction, by visual estimation, is 60 to 65%. The left ventricle has normal function. There is moderately increased left ventricular hypertrophy.  2. Left ventricular diastolic parameters are consistent with Grade I diastolic dysfunction (impaired relaxation).  3. Global right ventricle has normal systolic function.The right ventricular size is normal. No increase in right ventricular wall thickness.  4. Left atrial size was normal.  5. Right atrial size was normal.  6. The mitral valve is normal in structure. Mild mitral valve  regurgitation.  7. The tricuspid valve is normal in structure. Tricuspid valve regurgitation is trivial.  8. The aortic valve is tricuspid. Aortic valve regurgitation is not visualized.  9. The pulmonic valve was grossly normal. Pulmonic valve regurgitation is not visualized.  10. Aneurysm of the aortic root, measuring 46 mm.  11. Aortic root is unchanged from previous echo.    Neuro/Psych negative neurological ROS  negative psych ROS   GI/Hepatic negative GI  ROS, Neg liver ROS,   Endo/Other  Hyperlipidemia Obesity Gout  Renal/GU Renal InsufficiencyRenal disease   Left Hydrocele    Musculoskeletal  (+) Arthritis , Rheumatoid disorders,    Abdominal (+) + obese,   Peds  Hematology negative hematology ROS (+)   Anesthesia Other Findings   Reproductive/Obstetrics                            Anesthesia Physical Anesthesia Plan  ASA: III  Anesthesia Plan: General   Post-op Pain Management:    Induction: Intravenous  PONV Risk Score and Plan: 4 or greater and Ondansetron, Midazolam, Dexamethasone and Treatment may vary due to age or medical condition  Airway Management Planned: LMA  Additional Equipment:   Intra-op Plan:   Post-operative Plan: Extubation in OR  Informed Consent: I have reviewed the patients History and Physical, chart, labs and discussed the procedure including the risks, benefits and alternatives for the proposed anesthesia with the patient or authorized representative who has indicated his/her understanding and acceptance.     Dental advisory given  Plan Discussed with: CRNA and Surgeon  Anesthesia Plan Comments: (See APP note by Joslyn Hy, FNP)       Anesthesia Quick Evaluation

## 2020-06-09 NOTE — Progress Notes (Signed)
Anesthesia Chart Review:   Case: [id number] Date/Time: 06/14/20 1200   Procedure: LEFT  HYDROCELECTOMY ADULT (Left )   Anesthesia type: General   Pre-op diagnosis: left hydrocelectomy   Location: [facility] OR ROOM 1 / [facility]   Surgeons: Noel Christmas, MD      DISCUSSION: Pt is 64 years old with hx thoracic aortic aneurysm (79mm by 10/09/19 CT), PVCs (s/p ablation 10/07/18 with recurrence; per Dr. Ladona Ridgel, no indication for repeat ablation), HTN, CKD, OSA, RA   PROVIDERS: - PCP is [name], MD (notes in care everywhere)  - Cardiologist is [name], MD. Last virtual office visit 10/26/19 with Ronie Spies, PA - EP cardiologist is [name], MD. Last office visit 08/13/19 with Francis Dowse, PA   LABS:  Will be obtained day of surgery   IMAGES: CT angio chest aorta 10/09/19:  1. Stable uncomplicated mild fusiform aneurysmal dilatation of the ascending thoracic aorta measuring 43 mm in diameter, unchanged compared to the 02/2016 examination.  2. Similar findings of cardiomegaly, mild enlargement of the caliber of the main pulmonary artery and trace pericardial effusion. Further evaluation cardiac echo could be performed as indicated. 3. 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 4.  Aortic Atherosclerosis    Renal artery [place] 04/23/19:  - Right: No evidence of right renal artery stenosis. RRV flow present. Normal right Resisitive Index. Normal cortical thickness of right kidney. Normal size right kidney.  - Left: No evidence of left renal artery stenosis. LRV flow present. Normal cortical thickness of the left kidney. Normal left Resistive Index. Normal size of left kidney.  - Mesenteric: Normal Superior Mesenteric artery findings. Areas of limited visceral study include mesenteric arteries, unable to visualize the celiac artery well enough to obtain  Doppler signal.    EKG 09/24/19: sinus bradycardia with 1st degree AV block and occasional PVC   CV: Echo 10/19/19:  1. Left ventricular ejection fraction, by visual estimation, is 60 to 65%. The left ventricle has normal function. There is moderately increased left ventricular hypertrophy.  2. Left ventricular diastolic parameters are consistent with Grade I diastolic dysfunction (impaired relaxation).  3. Global right ventricle has normal systolic function.The right ventricular size is normal. No increase in right ventricular wall thickness.  4. Left atrial size was normal.  5. Right atrial size was normal.  6. The mitral valve is normal in structure. Mild mitral valve regurgitation.  7. The tricuspid valve is normal in structure. Tricuspid valve regurgitation is trivial.  8. The aortic valve is tricuspid. Aortic valve regurgitation is not visualized.  9. The pulmonic valve was grossly normal. Pulmonic valve regurgitation is not visualized.  10. Aneurysm of the aortic root, measuring 46 mm.  11. Aortic root is unchanged from previous echo.  Cardiac event monitor 08/07/19:  - Patient had a min HR of 41 bpm, max HR of 146 bpm, and avg HR of 62 bpm.  - Predominant underlying rhythm was Sinus Rhythm. First Degree AV Block was present. Bundle Branch Block/IVCD was present.  - 1 run of Supraventricular Tachycardia occurred lasting 5 beats with a max rate of 146 bpm (avg 142 bpm). Isolated SVEs were rare (<1.0%), SVE Couplets were rare (<1.0%), and SVE Triplets were rare (<1.0%). Isolated VEs were frequent (8.2%, 20629), VE Couplets were rare (<1.0%, 527), and VE Triplets were rare (<1.0%, 10). Ventricular Bigeminy and Trigeminy were Present.  Nuclear stress test 05/14/18:   Nuclear stress EF: 61%.  There was no ST segment deviation noted during stress.  The study is normal.  The left ventricular ejection fraction is normal (55-65%).   Past Medical History:  Diagnosis Date   . Ascending aortic aneurysm (HCC)    72mm by echo 10-19-2019 and 25mm by Chest CTA 10/09/2019 (both in epic)  . Bilateral lower extremity edema   . Chronic gout without tophus    followed by dr Sharmon Revere    (06-08-2020  per pt last episode right knee 3 wks ago)  . CKD (chronic kidney disease), stage II    nephrology--- Virgina Norfolk PA (10-29-2019 note in epic scanned in  media)  . Hypertension    followed by cardiology, dr t. turner   (05-14-2018 nuclear study in epic , normal perfusion with nuclear ef 61%)  . Left hydrocele   . OSA on CPAP    per pt uses every night  . Palpitations followed by dr t. Mayford Knife   06-08-2020  still feels palipations due to PVCs when exertion but not with chest pain/ discomfort  . PVC's (premature ventricular contractions) cardiologist--- dr t. turner   Status post PVC ablation by Dr. Ladona Ridgel 2019 with recurrence of frequent PVCs/bigeminy;  prior pseudobradycardia r/t pvcs  . Rheumatoid arthritis involving multiple sites [facility])    rheumotology--- dr a. Sharmon Revere  ([facility] in [place])    Past Surgical History:  Procedure Laterality Date  . INCISIONAL HERNIA REPAIR  02-23-2016   @[facility]    LAPAROSCOPIC  . LAPAROSCOPIC INGUINAL HERNIA REPAIR Bilateral 08-22-2015  @[facility]    AND UMBILICAL HERNIA REPAIR  . PVC ABLATION N/A 10/07/2018   Procedure: PVC ABLATION;  Surgeon: , MD;  Location: [facility] INVASIVE CV LAB;  Service: Cardiovascular;  Laterality: N/A;  . UMBILICAL HERNIA REPAIR  child    MEDICATIONS: No current facility-administered medications for this encounter.   10/09/2018 acetaminophen-codeine (TYLENOL #3) 300-30 MG tablet  . allopurinol (ZYLOPRIM) 100 MG tablet  . atorvastatin (LIPITOR) 20 MG tablet  . chlorthalidone (HYGROTON) 25 MG tablet  . colchicine 0.6 MG tablet  . fluticasone (FLONASE) 50 MCG/ACT nasal spray  . folic acid (FOLVITE) 1 MG tablet  . gabapentin (NEURONTIN) 600 MG tablet  . hydrALAZINE (APRESOLINE) 100 MG tablet  . KLOR-CON M20 20  MEQ tablet  . losartan (COZAAR) 100 MG tablet  . montelukast (SINGULAIR) 10 MG tablet  . nebivolol (BYSTOLIC) 5 MG tablet  . predniSONE (DELTASONE) 5 MG tablet  . amLODipine (NORVASC) 5 MG tablet    If labs acceptable day of surgery, I anticipate pt can proceed with surgery as scheduled.  Marinus Maw, FNP-BC [facility]/Anesthesiology Phone: [phone] 06/09/2020 11:45 AM

## 2020-06-10 ENCOUNTER — Other Ambulatory Visit (HOSPITAL_COMMUNITY)
Admission: RE | Admit: 2020-06-10 | Discharge: 2020-06-10 | Disposition: A | Discharge status: Home / Self Care | Payer: PRIVATE HEALTH INSURANCE | Source: Ambulatory Visit | Attending: Urology | Admitting: Urology

## 2020-06-10 DIAGNOSIS — Z20822 Contact with and (suspected) exposure to covid-19: Secondary | ICD-10-CM | POA: Insufficient documentation

## 2020-06-10 DIAGNOSIS — Z01812 Encounter for preprocedural laboratory examination: Secondary | ICD-10-CM | POA: Insufficient documentation

## 2020-06-10 LAB — SARS CORONAVIRUS 2 (TAT 6-24 HRS): SARS Coronavirus 2: NEGATIVE

## 2020-06-11 ENCOUNTER — Other Ambulatory Visit: Payer: Self-pay

## 2020-06-11 ENCOUNTER — Ambulatory Visit
Admission: RE | Admit: 2020-06-11 | Discharge: 2020-06-11 | Disposition: A | Discharge status: Home / Self Care | Payer: PRIVATE HEALTH INSURANCE | Source: Ambulatory Visit | Attending: Nephrology | Admitting: Nephrology

## 2020-06-11 DIAGNOSIS — I10 Essential (primary) hypertension: Secondary | ICD-10-CM

## 2020-06-11 DIAGNOSIS — N182 Chronic kidney disease, stage 2 (mild): Secondary | ICD-10-CM

## 2020-06-11 IMAGING — MR MR MRA ABDOMEN W/ OR W/O CM
10 of 14 series · 36 of 48 positions shown · IV contrast (multihance)
Comparison: Chest CT [DATE], CT [DATE]

CLINICAL DATA: 64-year-old male with a history hypertension, stage
2 chronic kidney disease

EXAM:
MRI ABDOMEN WITHOUT AND WITH CONTRAST
TECHNIQUE: Multiplanar multisequence MR imaging of the abdomen was performed
both before and after the administration of intravenous contrast.
CONTRAST:  20mL MULTIHANCE GADOBENATE DIMEGLUMINE 529 MG/ML IV SOLN

[Series 3: bSSFP · coronal · 5.0mm · 1.41mm/px · 1 of 22 slices shown (1 of 2)]
[im 1/22]
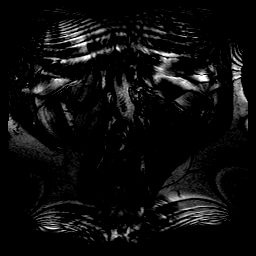

[Series 4: bSSFP · axial · 6.0mm · 1.64mm/px · 1 of 21 slices shown (2 of 2)]
[im 1/21]
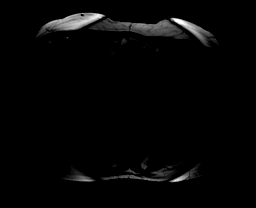

[Series 5: T2 fat-sat · coronal · 5.0mm · 1.64mm/px · 1 of 24 slices shown]
[im 1/24]
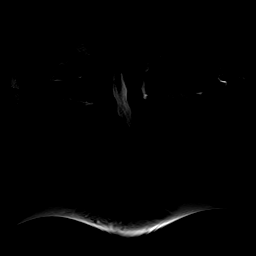

[Series 6: T2 · axial · 6.0mm · 0.82mm/px · z∈[-69,+171]mm · 2 of 33 slices shown]
[im 1/33]
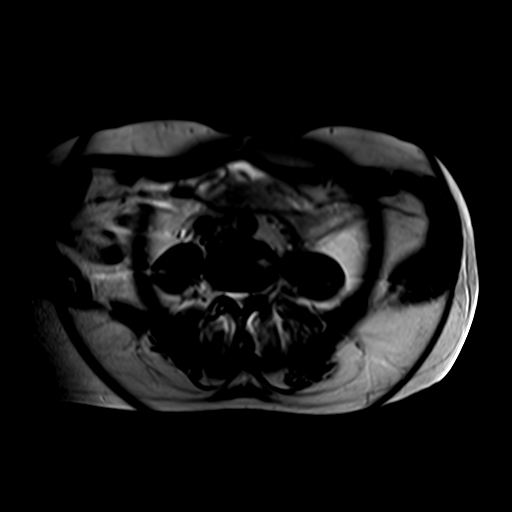
[im 33/33]
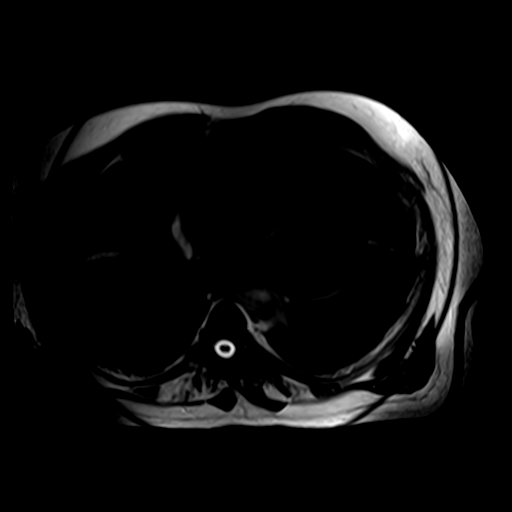

[Series 7: T1 · axial · 6.0mm · 1.64mm/px · z∈[-68,+96]mm · 2 of 23 slices shown (1 of 2)]
[im 1/23]
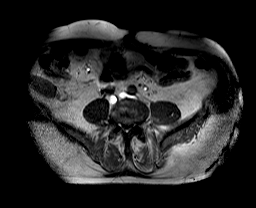
[im 23/23]
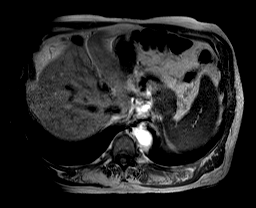

[Series 8: T1 · coronal · 5.5mm · 1.48mm/px · 2 of 19 slices shown (2 of 2)]
[im 1/19]
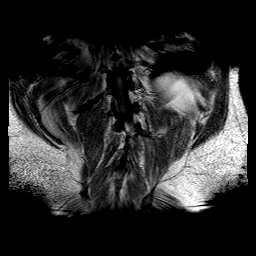
[im 19/19]
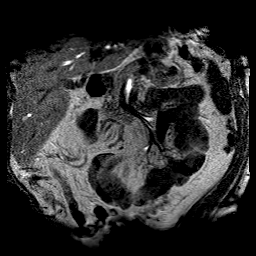

[Series 9: T1 dynamic · axial · non-contrast · 3.5mm · 0.82mm/px · z∈[-68,+96]mm · 4 of 48 slices shown]
[im 1/48]
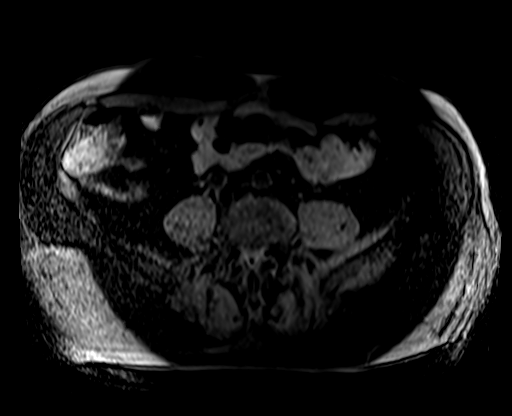
[im 16/48]
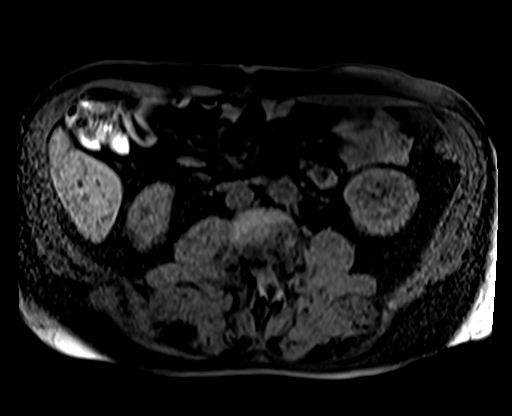
[im 32/48]
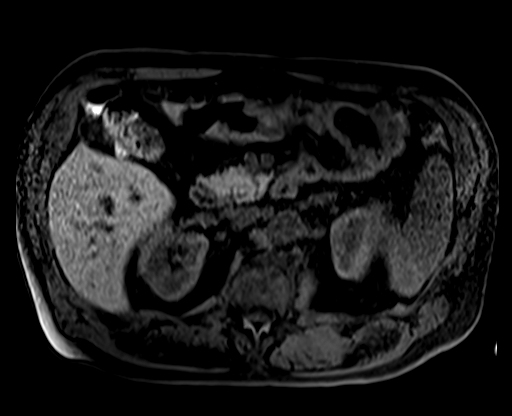
[im 48/48]
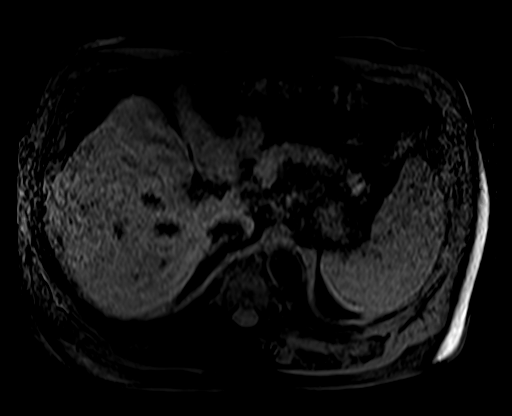

[Series 11: pre 3d_tt=1.0s · coronal · non-contrast · 1.2mm · 0.94mm/px · 8 of 96 slices shown]
[im 1/96]
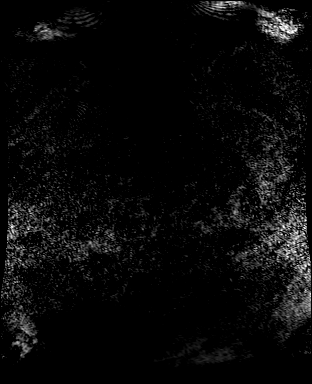
[im 14/96]
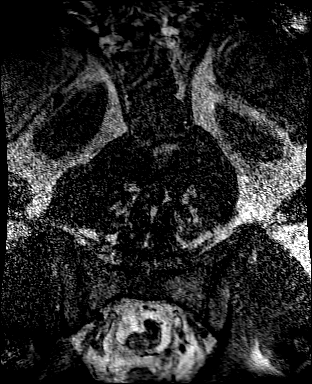
[im 28/96]
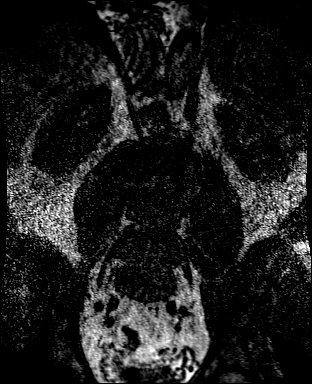
[im 41/96]
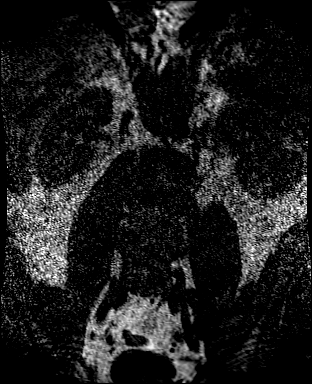
[im 55/96]
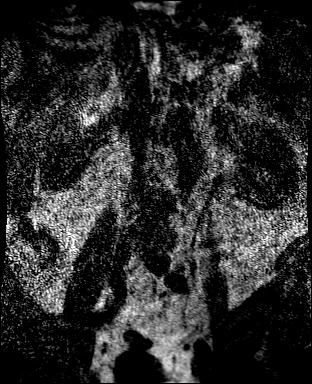
[im 68/96]
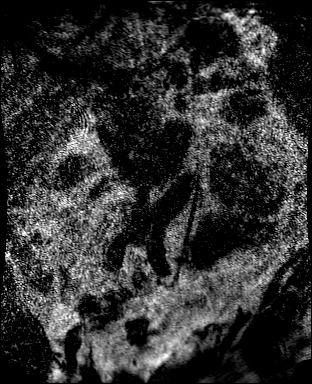
[im 82/96]
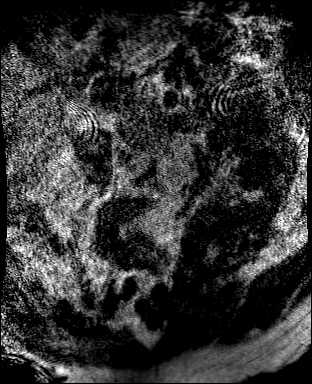
[im 96/96]
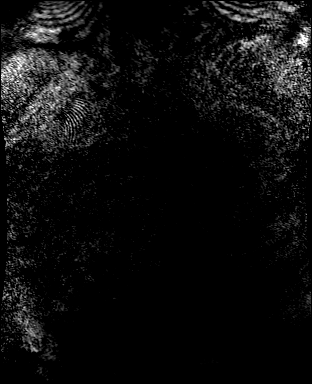

[Series 13: mra_tt=1.0s · coronal · 1.2mm · 0.94mm/px · 8 of 96 slices shown]
[im 1/96]
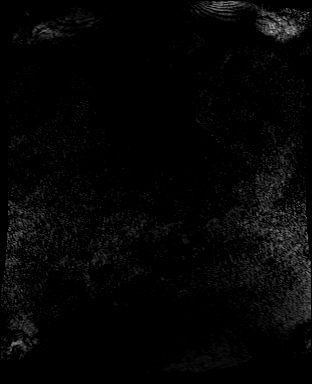
[im 14/96]
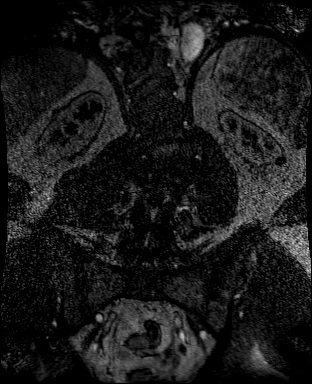
[im 28/96]
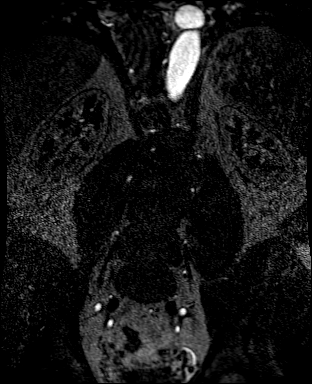
[im 41/96]
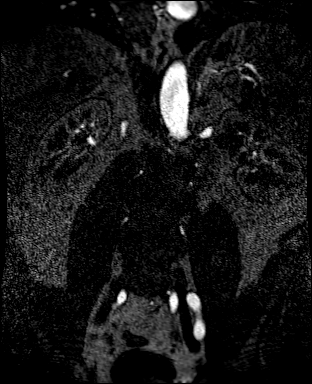
[im 55/96]
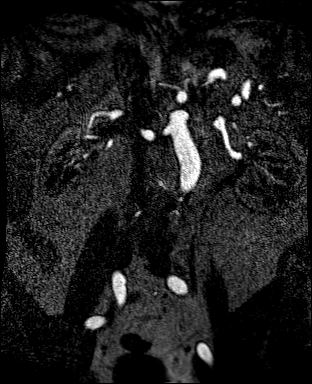
[im 68/96]
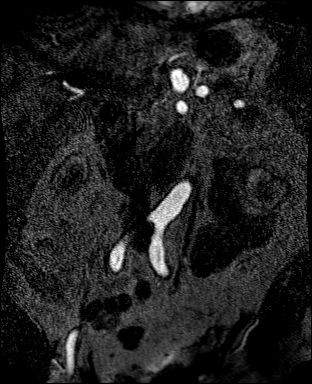
[im 82/96]
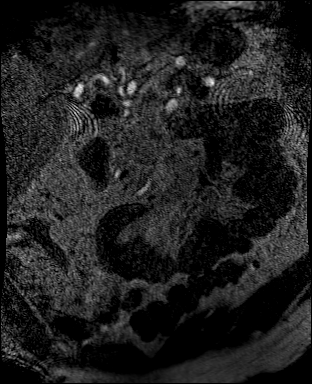
[im 96/96]
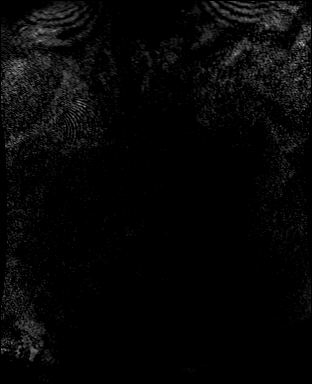

[Series 14: mra_tt=1.0s_sub · coronal · 1.2mm · 0.94mm/px · 7 of 96 slices shown]
[im 1/96]
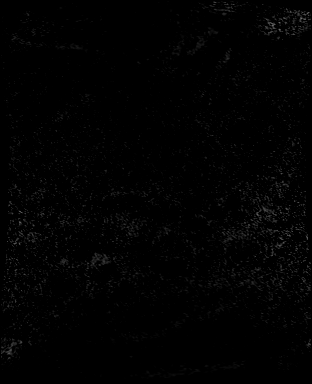
[im 14/96]
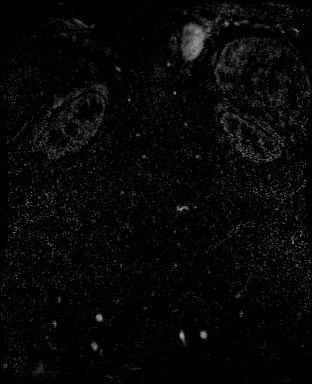
[im 28/96]
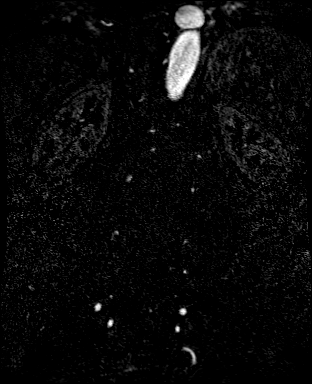
[im 41/96]
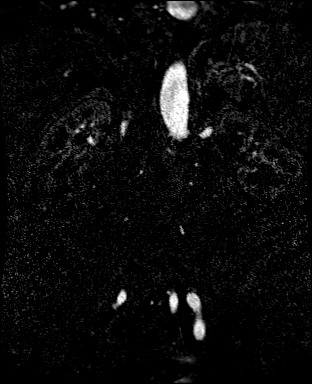
[im 55/96]
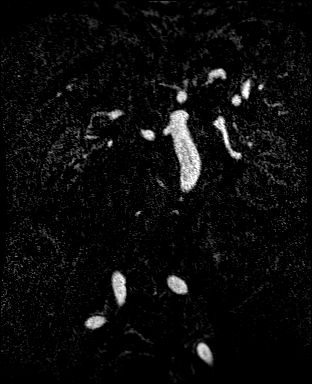
[im 68/96]
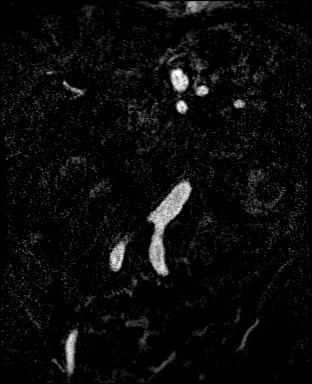
[im 82/96]
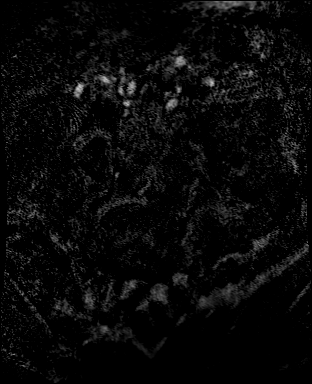

[36 of 48 positions shown; findings below may reference images not displayed]

FINDINGS: VASCULAR:

Aorta:

Tortuous lower thoracic and abdominal aorta, similar to the
comparison. No aneurysm, dissection, or periaortic
fluid/inflammatory changes. No significant calcified or noncalcified
plaque.

Mesenteric arteries: Origin of the celiac artery patent. Ectasia of
the celiac artery just after the origin is similar to the comparison
CT.

SMA patent without significant atherosclerosis. IMA origin is
patent. No significant atherosclerotic changes at the origin of the
mesenteric arteries.

Renal arteries:

Right: Single right renal artery. No pre hilar branches. No
significant stenosis or significant atherosclerotic changes at the
origin the right renal artery. There is no beading of the artery.
Anterior and posterior division rami are patent at the hilum of the
right kidney.

Left:

Single left renal artery. No significant stenosis or atherosclerotic
changes at the origin of left renal artery. No beading. No pre hilar
branches. The rami at the division in the hilum of the left kidney
are patent.

Visualized iliac arteries are patent. No beading of the iliac
arteries to suggest FMD. Bilateral hypogastric arteries are patent.

Lower chest: No acute finding

Hepatobiliary: Uniform signal of the visualized liver. Unremarkable
gallbladder.

Pancreas:  Unremarkable

Spleen: Greatest diameter of the spleen measures over 14 cm. Uniform
signal. No significant change in the diameter compared to prior CT

Adrenals/Urinary Tract: Unremarkable appearance of the adrenal
glands.

Symmetric kidneys.  No hydronephrosis.

Small bilateral T2 hyperintense cysts of the kidneys, compatible
with simple cysts. All of these measure 10 mm or less.

Stomach/Bowel: Unremarkable appearance of the visualized GI tract

Other:  None

Musculoskeletal: Scoliotic curvature of the thoracolumbar spine,
incompletely imaged. Degenerative changes.
IMPRESSION: Single renal arteries bilaterally with no evidence of high-grade
stenosis or FMD.

Unchanged configuration of the proximal celiac artery, with ectasia.

Splenomegaly.

## 2020-06-11 MED ORDER — GADOBENATE DIMEGLUMINE 529 MG/ML IV SOLN
20.0000 mL | Freq: Once | INTRAVENOUS | Status: AC | PRN
  Administered 2020-06-11: 20 mL via INTRAVENOUS

## 2020-06-14 ENCOUNTER — Encounter (HOSPITAL_BASED_OUTPATIENT_CLINIC_OR_DEPARTMENT_OTHER)
Admission: RE | Disposition: A | Discharge status: Home / Self Care | Payer: Self-pay | Source: Home / Self Care | Attending: Urology

## 2020-06-14 ENCOUNTER — Other Ambulatory Visit: Payer: Self-pay

## 2020-06-14 ENCOUNTER — Ambulatory Visit (HOSPITAL_BASED_OUTPATIENT_CLINIC_OR_DEPARTMENT_OTHER): Payer: No Typology Code available for payment source | Admitting: Emergency Medicine

## 2020-06-14 ENCOUNTER — Ambulatory Visit (HOSPITAL_BASED_OUTPATIENT_CLINIC_OR_DEPARTMENT_OTHER)
Admission: RE | Admit: 2020-06-14 | Discharge: 2020-06-14 | Disposition: A | Discharge status: Home / Self Care | Payer: No Typology Code available for payment source | Attending: Urology | Admitting: Urology

## 2020-06-14 ENCOUNTER — Encounter (HOSPITAL_BASED_OUTPATIENT_CLINIC_OR_DEPARTMENT_OTHER): Payer: Self-pay | Admitting: Urology

## 2020-06-14 DIAGNOSIS — Z6834 Body mass index (BMI) 34.0-34.9, adult: Secondary | ICD-10-CM | POA: Diagnosis not present

## 2020-06-14 DIAGNOSIS — E669 Obesity, unspecified: Secondary | ICD-10-CM | POA: Diagnosis not present

## 2020-06-14 DIAGNOSIS — E785 Hyperlipidemia, unspecified: Secondary | ICD-10-CM | POA: Insufficient documentation

## 2020-06-14 DIAGNOSIS — N433 Hydrocele, unspecified: Secondary | ICD-10-CM | POA: Insufficient documentation

## 2020-06-14 DIAGNOSIS — Z79899 Other long term (current) drug therapy: Secondary | ICD-10-CM | POA: Diagnosis not present

## 2020-06-14 DIAGNOSIS — I10 Essential (primary) hypertension: Secondary | ICD-10-CM | POA: Diagnosis not present

## 2020-06-14 DIAGNOSIS — M1990 Unspecified osteoarthritis, unspecified site: Secondary | ICD-10-CM | POA: Insufficient documentation

## 2020-06-14 HISTORY — DX: Obstructive sleep apnea (adult) (pediatric): Z99.89

## 2020-06-14 HISTORY — DX: Hydrocele, unspecified: N43.3

## 2020-06-14 HISTORY — DX: Obstructive sleep apnea (adult) (pediatric): G47.33

## 2020-06-14 HISTORY — DX: Rheumatoid arthritis, unspecified: M06.9

## 2020-06-14 HISTORY — DX: Chronic gout, unspecified, without tophus (tophi): M1A.9XX0

## 2020-06-14 HISTORY — DX: Localized edema: R60.0

## 2020-06-14 HISTORY — PX: HYDROCELE EXCISION: SHX482

## 2020-06-14 HISTORY — DX: Palpitations: R00.2

## 2020-06-14 LAB — POCT I-STAT, CHEM 8
BUN: 17 mg/dL (ref 8–23)
Calcium, Ion: 1.28 mmol/L (ref 1.15–1.40)
Chloride: 101 mmol/L (ref 98–111)
Creatinine, Ser: 1.1 mg/dL (ref 0.61–1.24)
Glucose, Bld: 104 mg/dL — ABNORMAL HIGH (ref 70–99)
HCT: 39 % (ref 39.0–52.0)
Hemoglobin: 13.3 g/dL (ref 13.0–17.0)
Potassium: 3.1 mmol/L — ABNORMAL LOW (ref 3.5–5.1)
Sodium: 140 mmol/L (ref 135–145)
TCO2: 24 mmol/L (ref 22–32)

## 2020-06-14 SURGERY — HYDROCELECTOMY
Anesthesia: General | Site: Scrotum | Laterality: Left

## 2020-06-14 MED ORDER — BUPIVACAINE HCL (PF) 0.25 % IJ SOLN
INTRAMUSCULAR | Status: DC | PRN
  Administered 2020-06-14: 10 mL

## 2020-06-14 MED ORDER — EPHEDRINE SULFATE 50 MG/ML IJ SOLN
INTRAMUSCULAR | Status: DC | PRN
  Administered 2020-06-14: 10 mg via INTRAVENOUS

## 2020-06-14 MED ORDER — DOCUSATE SODIUM 100 MG PO CAPS
100.0000 mg | ORAL_CAPSULE | Freq: Two times a day (BID) | ORAL | 2 refills | Status: DC
Start: 2020-06-14 — End: 2020-08-11

## 2020-06-14 MED ORDER — ONDANSETRON HCL 4 MG/2ML IJ SOLN
INTRAMUSCULAR | Status: AC
  Filled 2020-06-14: qty 2

## 2020-06-14 MED ORDER — HYDROCODONE-ACETAMINOPHEN 5-325 MG PO TABS: 1.0000 | ORAL_TABLET | ORAL | 0 refills | Status: DC | PRN

## 2020-06-14 MED ORDER — GLYCOPYRROLATE PF 0.2 MG/ML IJ SOSY
PREFILLED_SYRINGE | INTRAMUSCULAR | Status: AC
  Filled 2020-06-14: qty 1

## 2020-06-14 MED ORDER — CEFAZOLIN SODIUM-DEXTROSE 2-4 GM/100ML-% IV SOLN
2.0000 g | INTRAVENOUS | Status: AC
  Administered 2020-06-14: 2 g via INTRAVENOUS

## 2020-06-14 MED ORDER — FENTANYL CITRATE (PF) 100 MCG/2ML IJ SOLN
INTRAMUSCULAR | Status: DC | PRN
  Administered 2020-06-14 (×2): 50 ug via INTRAVENOUS

## 2020-06-14 MED ORDER — GLYCOPYRROLATE 0.2 MG/ML IJ SOLN
INTRAMUSCULAR | Status: DC | PRN
  Administered 2020-06-14: .2 mg via INTRAVENOUS

## 2020-06-14 MED ORDER — OXYCODONE HCL 5 MG/5ML PO SOLN: 5.0000 mg | Freq: Once | ORAL | Status: DC | PRN

## 2020-06-14 MED ORDER — LIDOCAINE HCL (CARDIAC) PF 100 MG/5ML IV SOSY
PREFILLED_SYRINGE | INTRAVENOUS | Status: DC | PRN
  Administered 2020-06-14: 60 mg via INTRAVENOUS

## 2020-06-14 MED ORDER — MIDAZOLAM HCL 2 MG/2ML IJ SOLN
INTRAMUSCULAR | Status: AC
  Filled 2020-06-14: qty 2

## 2020-06-14 MED ORDER — LIDOCAINE 2% (20 MG/ML) 5 ML SYRINGE
INTRAMUSCULAR | Status: AC
  Filled 2020-06-14: qty 5

## 2020-06-14 MED ORDER — PROPOFOL 10 MG/ML IV BOLUS
INTRAVENOUS | Status: AC
  Filled 2020-06-14: qty 20

## 2020-06-14 MED ORDER — MEPERIDINE HCL 25 MG/ML IJ SOLN: 6.2500 mg | INTRAMUSCULAR | Status: DC | PRN

## 2020-06-14 MED ORDER — EPHEDRINE 5 MG/ML INJ
INTRAVENOUS | Status: AC
  Filled 2020-06-14: qty 10

## 2020-06-14 MED ORDER — FENTANYL CITRATE (PF) 100 MCG/2ML IJ SOLN
INTRAMUSCULAR | Status: AC
  Filled 2020-06-14: qty 2

## 2020-06-14 MED ORDER — ONDANSETRON HCL 4 MG/2ML IJ SOLN: 4.0000 mg | Freq: Once | INTRAMUSCULAR | Status: DC | PRN

## 2020-06-14 MED ORDER — DEXAMETHASONE SODIUM PHOSPHATE 10 MG/ML IJ SOLN
INTRAMUSCULAR | Status: DC | PRN
  Administered 2020-06-14: 10 mg via INTRAVENOUS

## 2020-06-14 MED ORDER — DEXAMETHASONE SODIUM PHOSPHATE 10 MG/ML IJ SOLN
INTRAMUSCULAR | Status: AC
  Filled 2020-06-14: qty 1

## 2020-06-14 MED ORDER — LACTATED RINGERS IV SOLN: INTRAVENOUS | Status: DC

## 2020-06-14 MED ORDER — MIDAZOLAM HCL 5 MG/5ML IJ SOLN
INTRAMUSCULAR | Status: DC | PRN
  Administered 2020-06-14: 2 mg via INTRAVENOUS

## 2020-06-14 MED ORDER — ONDANSETRON HCL 4 MG/2ML IJ SOLN
INTRAMUSCULAR | Status: DC | PRN
  Administered 2020-06-14: 4 mg via INTRAVENOUS

## 2020-06-14 MED ORDER — PROPOFOL 10 MG/ML IV BOLUS
INTRAVENOUS | Status: DC | PRN
  Administered 2020-06-14: 200 mg via INTRAVENOUS

## 2020-06-14 MED ORDER — FENTANYL CITRATE (PF) 100 MCG/2ML IJ SOLN: 25.0000 ug | INTRAMUSCULAR | Status: DC | PRN

## 2020-06-14 MED ORDER — CEFAZOLIN SODIUM-DEXTROSE 2-4 GM/100ML-% IV SOLN
INTRAVENOUS | Status: AC
  Filled 2020-06-14: qty 100

## 2020-06-14 MED ORDER — OXYCODONE HCL 5 MG PO TABS: 5.0000 mg | ORAL_TABLET | Freq: Once | ORAL | Status: DC | PRN

## 2020-06-14 SURGICAL SUPPLY — 44 items
ADH SKN CLS APL DERMABOND .7 (GAUZE/BANDAGES/DRESSINGS) ×1
BLADE CLIPPER SENSICLIP SURGIC (BLADE) ×2 IMPLANT
BLADE HEX COATED 2.75 (ELECTRODE) ×2 IMPLANT
BLADE SURG 15 STRL LF DISP TIS (BLADE) ×1 IMPLANT
BLADE SURG 15 STRL SS (BLADE) ×2
BNDG GAUZE ELAST 4 BULKY (GAUZE/BANDAGES/DRESSINGS) ×2 IMPLANT
BRIEF STRETCH FOR OB PAD LRG (UNDERPADS AND DIAPERS) ×2 IMPLANT
COVER BACK TABLE 60X90IN (DRAPES) ×2 IMPLANT
COVER MAYO STAND STRL (DRAPES) ×2 IMPLANT
COVER WAND RF STERILE (DRAPES) ×2 IMPLANT
DERMABOND ADVANCED (GAUZE/BANDAGES/DRESSINGS) ×1
DERMABOND ADVANCED .7 DNX12 (GAUZE/BANDAGES/DRESSINGS) IMPLANT
DISSECTOR ROUND CHERRY 3/8 STR (MISCELLANEOUS) IMPLANT
DRAIN PENROSE 0.25X18 (DRAIN) ×2 IMPLANT
DRAPE LAPAROTOMY 100X72 PEDS (DRAPES) ×2 IMPLANT
DRSG TEGADERM 4X4.75 (GAUZE/BANDAGES/DRESSINGS) IMPLANT
ELECT REM PT RETURN 9FT ADLT (ELECTROSURGICAL) ×2
ELECTRODE REM PT RTRN 9FT ADLT (ELECTROSURGICAL) ×1 IMPLANT
GLOVE BIO SURGEON STRL SZ 6.5 (GLOVE) ×2 IMPLANT
GOWN STRL REUS W/TWL LRG LVL3 (GOWN DISPOSABLE) ×2 IMPLANT
KIT TURNOVER CYSTO (KITS) ×2 IMPLANT
NDL HYPO 25X1 1.5 SAFETY (NEEDLE) ×1 IMPLANT
NEEDLE HYPO 25X1 1.5 SAFETY (NEEDLE) ×2 IMPLANT
NS IRRIG 500ML POUR BTL (IV SOLUTION) ×1 IMPLANT
PENCIL BUTTON HOLSTER BLD 10FT (ELECTRODE) ×2 IMPLANT
SET BASIN DAY SURGERY F.S. (CUSTOM PROCEDURE TRAY) ×2 IMPLANT
SUPPORTER ATHLETIC XL (MISCELLANEOUS) ×2
SUPPORTER ATHLETIC XL 3X44-50X (MISCELLANEOUS) IMPLANT
SUT CHROMIC 3 0 SH 27 (SUTURE) ×1 IMPLANT
SUT ETHILON 4 0 PS 2 18 (SUTURE) ×1 IMPLANT
SUT MNCRL AB 4-0 PS2 18 (SUTURE) ×1 IMPLANT
SUT VIC AB 2-0 SH 27 (SUTURE) ×2
SUT VIC AB 2-0 SH 27XBRD (SUTURE) IMPLANT
SUT VIC AB 3-0 SH 27 (SUTURE) ×2
SUT VIC AB 3-0 SH 27X BRD (SUTURE) IMPLANT
SUT VIC AB 3-0 SH 27XBRD (SUTURE) ×1 IMPLANT
SUT VIC AB 4-0 SH 27 (SUTURE)
SUT VIC AB 4-0 SH 27XANBCTRL (SUTURE) ×1 IMPLANT
SYR CONTROL 10ML LL (SYRINGE) ×2 IMPLANT
TOWEL OR 17X26 10 PK STRL BLUE (TOWEL DISPOSABLE) ×3 IMPLANT
TRAY DSU PREP LF (CUSTOM PROCEDURE TRAY) ×2 IMPLANT
TUBE CONNECTING 12X1/4 (SUCTIONS) ×2 IMPLANT
WATER STERILE IRR 500ML POUR (IV SOLUTION) IMPLANT
YANKAUER SUCT BULB TIP NO VENT (SUCTIONS) ×2 IMPLANT

## 2020-06-14 NOTE — Op Note (Signed)
.  Operative Note  Preoperative diagnosis:  1. Left hydrocele  Postoperative diagnosis: 1. Left hydrocele  Procedure(s): 1. Left hydrocelectomy  Surgeon: Kasandra Knudsen, MD  Assistants: none  Anesthesia: Gen.  Complications: none immediate  EBL: 5 mL  Specimens: 1. none  Drains/Catheters: 1. none  Condition following The operation: stable  Intraoperative findings: left hydrocele  Indication: the patient is a 64 year old man with scrotal swelling found to have a left hydrocele confirmed by ultrasound.  Description of procedure:  The patient was identified and consent was obtained. He was taken back to the operating room and placed in the supine position. He was placed under general anesthesia and prepped and draped in the standard sterile fashion.Perioperative antibiotic was given. Timeout was performed. A 4 cm left hemiscrotal transverse incision was made. This was carried down through the dartos and the testicle along with the hydrocele sac was delivered onto the operative field. The gubernacular attachments were released with a combination of blunt dissection and electrocautery. Spot electrocautery was used for hemostasis as necessary. Care was taken to not use electrocautery close to the cord itself. The hydrocele sac was incised and fluid was evacuated. I extended the hydrocele sac incision proximally and distally. Excess sac was excised. This was discarded. The hydrocele sac was everted around the testicle and loosely reapproximated with a 3-0 chromic. Hemostasis was obtained carefully with Bovie electrocautery. I irrigated the scrotal cavity and there was good hemostasis. The testicle was delivered back into the scrotum in its proper anatomical position. The dartos was closed with a running 2-0 vicryl. The skin was closed with interrupted 3-0 chromic sutures. %0.25 marcaine was instilled for anesthetic affect. Dermabond was applied and then a dressing along with scrotal support  was applied. This concluded the operation. The patient tolerated the procedure well and was stable postoperatively.  Plan: The patient will return in several weeks for postoperative wound check.

## 2020-06-14 NOTE — Discharge Instructions (Signed)
Discharge instructions following scrotal surgery  Call your doctor for:  Fever is greater than 100.5  Severe nausea or vomiting  Increasing pain not controlled by pain medication  Increasing redness or drainage from incisions  The number for questions or concerns is [phone]  Activity level: No lifting greater than 10 pounds (about equal to milk) for the next 4 weeks or until cleared to do so at follow-up appointment.  Otherwise activity as tolerated by comfort level.  Diet: May resume your regular diet as tolerated  Driving: No driving while still taking opiate pain medications (weight at least 6-8 hours after last dose).  No driving if you still sore from surgery as it may limit her ability to react quickly if necessary.   Shower/bath: May shower and get incision wet pad dry immediately following.  Do not scrub vigorously for the next 2-3 weeks.  Do not soak incision (ID soaking in bath or swimming) until told he may do so by Dr., as this may promote a wound infection.  Wound care: He may cover wounds with sterile gauze as needed to prevent incisions rubbing on close follow-up in any seepage.  Where tight fitting underpants/scrotal support for at least 2 weeks.  He should apply cold compresses (ice or sac of frozen peas/corn) to your scrotum for at least 48 hours to reduce the swelling for 15 minutes at a time indirectly.  You should expect that his scrotum will swell up initially and then get smaller over the next 2-4 weeks.   Post Anesthesia Home Care Instructions  Activity: Get plenty of rest for the remainder of the day. A responsible individual must stay with you for 24 hours following the procedure.  For the next 24 hours, DO NOT: -Drive a car -Advertising copywriter -Drink alcoholic beverages -Take any medication unless instructed by your physician -Make any legal decisions or sign important papers.  Meals: Start with liquid foods such as gelatin or soup. Progress  to regular foods as tolerated. Avoid greasy, spicy, heavy foods. If nausea and/or vomiting occur, drink only clear liquids until the nausea and/or vomiting subsides. Call your physician if vomiting continues.  Special Instructions/Symptoms: Your throat may feel dry or sore from the anesthesia or the breathing tube placed in your throat during surgery. If this causes discomfort, gargle with warm salt water. The discomfort should disappear within 24 hours.

## 2020-06-14 NOTE — Anesthesia Postprocedure Evaluation (Signed)
Anesthesia Post Note  Patient: Ryan Glass  Procedure(s) Performed: LEFT  HYDROCELECTOMY ADULT (Left Scrotum)     Patient location during evaluation: PACU Anesthesia Type: General Level of consciousness: awake and alert and oriented Pain management: pain level controlled Vital Signs Assessment: post-procedure vital signs reviewed and stable Respiratory status: spontaneous breathing, nonlabored ventilation and respiratory function stable Cardiovascular status: blood pressure returned to baseline and stable Postop Assessment: no apparent nausea or vomiting Anesthetic complications: no   No complications documented.  Last Vitals:  Vitals:   06/14/20 1345 06/14/20 1400  BP: 136/82 128/83  Pulse: 71 73  Resp: 20 19  Temp:  36.7 C  SpO2: 96% 95%    Last Pain:  Vitals:   06/14/20 1400  TempSrc:   PainSc: 0-No pain                 Sacora Hawbaker A.

## 2020-06-14 NOTE — H&P (Signed)
CC/HPI: cc: scrotal swelling   11/03/19: 64 year old man with 3-4 month history of left-sided scrotal swelling. Scrotal ultrasound obtained confirms a large left-sided hydrocele and much smaller right hydrocele. Patient is not in any pain but is bothered by the pulling/hanging of the testicle. It is most bothersome at night when he is not wearing underwear. He denies significant urinary symptoms but does have nocturia 0-2 times at night depending on what he drinks. He denies history of hematuria, stones, UTIs or family history of prostate cancer.   06/10/20: Patient with above noted hx. He is scheduled for left hydrocelectomy on 7/6 with Dr. Arita Miss. Today he states that overall he continues to do well since prior office visit. He continues to have intermittent pulling/hanging sensation of his testicle. He denies significant increase in size since prior office visit. He denies any current pain or erythema to his testicle. He denies any changes in voiding symptoms. No complaints of gross hematuria or dysuria. He denies any recent complaints of fever, chills, nausea, vomiting, or increased shortness of breath. He denies any significant cardiac history and is not on any blood thinners.     ALLERGIES: Aspirin    MEDICATIONS: Acetaminophen-Codeine  Amlodipine Besylate 5 mg tablet  Atorvastatin Calcium 20 mg tablet  Bystolic 5 mg tablet  Chlorthalidone 25 mg tablet  Diclofenac Sodium  Embral  Flonase Allergy Relief  Furosemide 40 mg tablet  Gabapentin 600 mg tablet  Hydralazine Hcl  Hydroxychloroquine Sulfate 200 mg tablet  Losartan Potassium 100 mg tablet  Montelukast Sodium 10 mg tablet  Potassium Chloride     GU PSH: None   NON-GU PSH: None   GU PMH: Hydrocele (Stable), Left, Discussed ultrasound findings with the patient and this is confirmed on physical exam. I discussed management options for the hydrocele which include observation, aspiration and surgery. Patient is bothered enough by the  size that he would like to proceed with a left hydrocelectomy. I reviewed the risks and benefits of the hydrocelectomy including pain, bleeding, hematoma, infection, damage to surrounding structures including testicular artery and vas deferens, need for future treatment, and recurrence. Patient would like to schedule surgery for February 1 work is a little bit slower. We discussed postoperative care including no heavy lifting more than 10 lb for 4-6 weeks. I recommend that he take approximately 7 days off of work. - 11/03/2019    NON-GU PMH: None   FAMILY HISTORY: None   SOCIAL HISTORY: Marital Status: Married Preferred Language: English; Ethnicity: ; Race: Other Race Current Smoking Status: Patient has never smoked.   Tobacco Use Assessment Completed: Used Tobacco in last 30 days? Social Drinker.  Drinks 1 caffeinated drink per day.    REVIEW OF SYSTEMS:    GU Review Male:   Patient denies frequent urination, hard to postpone urination, burning/ pain with urination, get up at night to urinate, leakage of urine, stream starts and stops, trouble starting your stream, have to strain to urinate , erection problems, and penile pain.  Gastrointestinal (Upper):   Patient denies nausea, vomiting, and indigestion/ heartburn.  Gastrointestinal (Lower):   Patient denies diarrhea and constipation.  Constitutional:   Patient denies fever, night sweats, weight loss, and fatigue.  Skin:   Patient denies skin rash/ lesion and itching.  Eyes:   Patient denies blurred vision and double vision.  Ears/ Nose/ Throat:   Patient denies sore throat and sinus problems.  Hematologic/Lymphatic:   Patient denies swollen glands and easy bruising.  Cardiovascular:   Patient denies  leg swelling and chest pains.  Respiratory:   Patient denies cough and shortness of breath.  Endocrine:   Patient denies excessive thirst.  Musculoskeletal:   Patient denies back pain and joint pain.  Neurological:   Patient denies  headaches and dizziness.  Psychologic:   Patient denies depression and anxiety.   VITAL SIGNS:      06/10/2020 08:21 AM  Weight 242 lb / 109.77 kg  Height 69 in / 175.26 cm  BP 114/70 mmHg  Pulse 63 /min  Temperature 97.5 F / 36.3 C  BMI 35.7 kg/m   GU PHYSICAL EXAMINATION:    Scrotum: No lesions. No edema. No cysts. No warts.  Epididymides: Right: no spermatocele, no masses, no cysts, no tenderness, no induration, no enlargement. Left: no spermatocele, no masses, no cysts, no tenderness, no induration, no enlargement.  Testes: 3-5 cm hydrocele left testis. < 3 cm hydrocele right testis. No tenderness, no swelling, no enlargement left testis. No tenderness, no swelling, no enlargement right testis. Normal location left testis. Normal location right testis. No mass, no cyst, no varicocele left testis. No mass, no cyst, no varicocele right testis.   Sphincter Tone: Normal sphincter. No rectal tenderness. No rectal mass.    MULTI-SYSTEM PHYSICAL EXAMINATION:    Constitutional: Well-nourished. No physical deformities. Normally developed. Good grooming.  Respiratory: Normal breath sounds. No labored breathing, no use of accessory muscles.   Cardiovascular: Regular rate and rhythm. No murmur, no gallop. Normal temperature, normal extremity pulses, no swelling, no varicosities.   Neurologic / Psychiatric: Oriented to time, oriented to place, oriented to person. No depression, no anxiety, no agitation.  Gastrointestinal: No mass, no tenderness, no rigidity, non obese abdomen.  Musculoskeletal: Normal gait and station of head and neck.     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 We reviewed his upcoming procedure in detail and I answered all of his questions to the best of my ability. We reviewed preoperative instructions. He will keep scheduled follow-up for upcoming left hydrocelectomy on 07/06. We discussed he may call the office should he have any questions or concerns that arise in the interim. He voiced understanding.

## 2020-06-14 NOTE — Transfer of Care (Signed)
Immediate Anesthesia Transfer of Care Note  Patient: Ryan Glass  Procedure(s) Performed: Procedure(s) (LRB): LEFT  HYDROCELECTOMY ADULT (Left)  Patient Location: PACU  Anesthesia Type: General  Level of Consciousness: awake, sedated, patient cooperative and responds to stimulation  Airway & Oxygen Therapy: Patient Spontanous Breathing and Patient connected to [place] 02 and soft FM   Post-op Assessment: Report given to PACU RN, Post -op Vital signs reviewed and stable and Patient moving all extremities  Post vital signs: Reviewed and stable  Complications: No apparent anesthesia complications

## 2020-06-14 NOTE — Interval H&P Note (Signed)
History and Physical Interval Note:  06/14/2020 12:04 PM  Ryan Glass  has presented today for surgery, with the diagnosis of left hydrocelectomy.  The various methods of treatment have been discussed with the patient and family. After consideration of risks, benefits and other options for treatment, the patient has consented to  Procedure(s): LEFT  HYDROCELECTOMY ADULT (Left) as a surgical intervention.  The patient's history has been reviewed, patient examined, no change in status, stable for surgery.  I have reviewed the patient's chart and labs.  Questions were answered to the patient's satisfaction.     [name]

## 2020-06-14 NOTE — Anesthesia Procedure Notes (Signed)
Procedure Name: LMA Insertion Date/Time: 06/14/2020 12:49 PM Performed by: Jessica Priest, CRNA Pre-anesthesia Checklist: Patient identified, Emergency Drugs available, Suction available, Patient being monitored and Timeout performed Patient Re-evaluated:Patient Re-evaluated prior to induction Oxygen Delivery Method: Circle system utilized Preoxygenation: Pre-oxygenation with 100% oxygen Induction Type: IV induction Ventilation: Mask ventilation without difficulty LMA: LMA inserted LMA Size: 5.0 Number of attempts: 1 Airway Equipment and Method: Bite block Placement Confirmation: positive ETCO2,  breath sounds checked- equal and bilateral and CO2 detector Tube secured with: Tape Dental Injury: Teeth and Oropharynx as per pre-operative assessment

## 2020-06-15 ENCOUNTER — Encounter (HOSPITAL_BASED_OUTPATIENT_CLINIC_OR_DEPARTMENT_OTHER): Payer: Self-pay | Admitting: Urology

## 2020-07-12 ENCOUNTER — Emergency Department (HOSPITAL_COMMUNITY): Payer: No Typology Code available for payment source

## 2020-07-12 ENCOUNTER — Other Ambulatory Visit: Payer: Self-pay

## 2020-07-12 ENCOUNTER — Encounter (HOSPITAL_COMMUNITY): Payer: Self-pay

## 2020-07-12 DIAGNOSIS — N182 Chronic kidney disease, stage 2 (mild): Secondary | ICD-10-CM | POA: Diagnosis not present

## 2020-07-12 DIAGNOSIS — Z79899 Other long term (current) drug therapy: Secondary | ICD-10-CM | POA: Diagnosis not present

## 2020-07-12 DIAGNOSIS — R0602 Shortness of breath: Secondary | ICD-10-CM | POA: Diagnosis present

## 2020-07-12 DIAGNOSIS — U071 COVID-19: Secondary | ICD-10-CM | POA: Insufficient documentation

## 2020-07-12 DIAGNOSIS — I129 Hypertensive chronic kidney disease with stage 1 through stage 4 chronic kidney disease, or unspecified chronic kidney disease: Secondary | ICD-10-CM | POA: Insufficient documentation

## 2020-07-12 LAB — CBC WITH DIFFERENTIAL/PLATELET
Abs Immature Granulocytes: 0.02 10*3/uL (ref 0.00–0.07)
Basophils Absolute: 0 10*3/uL (ref 0.0–0.1)
Basophils Relative: 0 %
Eosinophils Absolute: 0 10*3/uL (ref 0.0–0.5)
Eosinophils Relative: 0 %
HCT: 37.9 % — ABNORMAL LOW (ref 39.0–52.0)
Hemoglobin: 12.8 g/dL — ABNORMAL LOW (ref 13.0–17.0)
Immature Granulocytes: 1 %
Lymphocytes Relative: 12 %
Lymphs Abs: 0.5 10*3/uL — ABNORMAL LOW (ref 0.7–4.0)
MCH: 34 pg (ref 26.0–34.0)
MCHC: 33.8 g/dL (ref 30.0–36.0)
MCV: 100.5 fL — ABNORMAL HIGH (ref 80.0–100.0)
Monocytes Absolute: 0.7 10*3/uL (ref 0.1–1.0)
Monocytes Relative: 18 %
Neutro Abs: 2.8 10*3/uL (ref 1.7–7.7)
Neutrophils Relative %: 69 %
Platelets: 163 10*3/uL (ref 150–400)
RBC: 3.77 MIL/uL — ABNORMAL LOW (ref 4.22–5.81)
RDW: 14 % (ref 11.5–15.5)
WBC: 4.1 10*3/uL (ref 4.0–10.5)
nRBC: 0 % (ref 0.0–0.2)

## 2020-07-12 LAB — COMPREHENSIVE METABOLIC PANEL
ALT: 28 U/L (ref 0–44)
AST: 29 U/L (ref 15–41)
Albumin: 4 g/dL (ref 3.5–5.0)
Alkaline Phosphatase: 71 U/L (ref 38–126)
Anion gap: 10 (ref 5–15)
BUN: 25 mg/dL — ABNORMAL HIGH (ref 8–23)
CO2: 23 mmol/L (ref 22–32)
Calcium: 8.5 mg/dL — ABNORMAL LOW (ref 8.9–10.3)
Chloride: 101 mmol/L (ref 98–111)
Creatinine, Ser: 1.48 mg/dL — ABNORMAL HIGH (ref 0.61–1.24)
GFR calc Af Amer: 57 mL/min — ABNORMAL LOW (ref >60)
GFR calc non Af Amer: 49 mL/min — ABNORMAL LOW (ref >60)
Glucose, Bld: 90 mg/dL (ref 70–99)
Potassium: 3.7 mmol/L (ref 3.5–5.1)
Sodium: 134 mmol/L — ABNORMAL LOW (ref 135–145)
Total Bilirubin: 0.7 mg/dL (ref 0.3–1.2)
Total Protein: 7 g/dL (ref 6.5–8.1)

## 2020-07-12 IMAGING — CR DG CHEST 2V
2 series · 2 of 2 positions shown · non-contrast
Comparison: CT [DATE]

CLINICAL DATA: [5T] positive, shortness of breath

EXAM:
CHEST - 2 VIEW

[w chest pa]
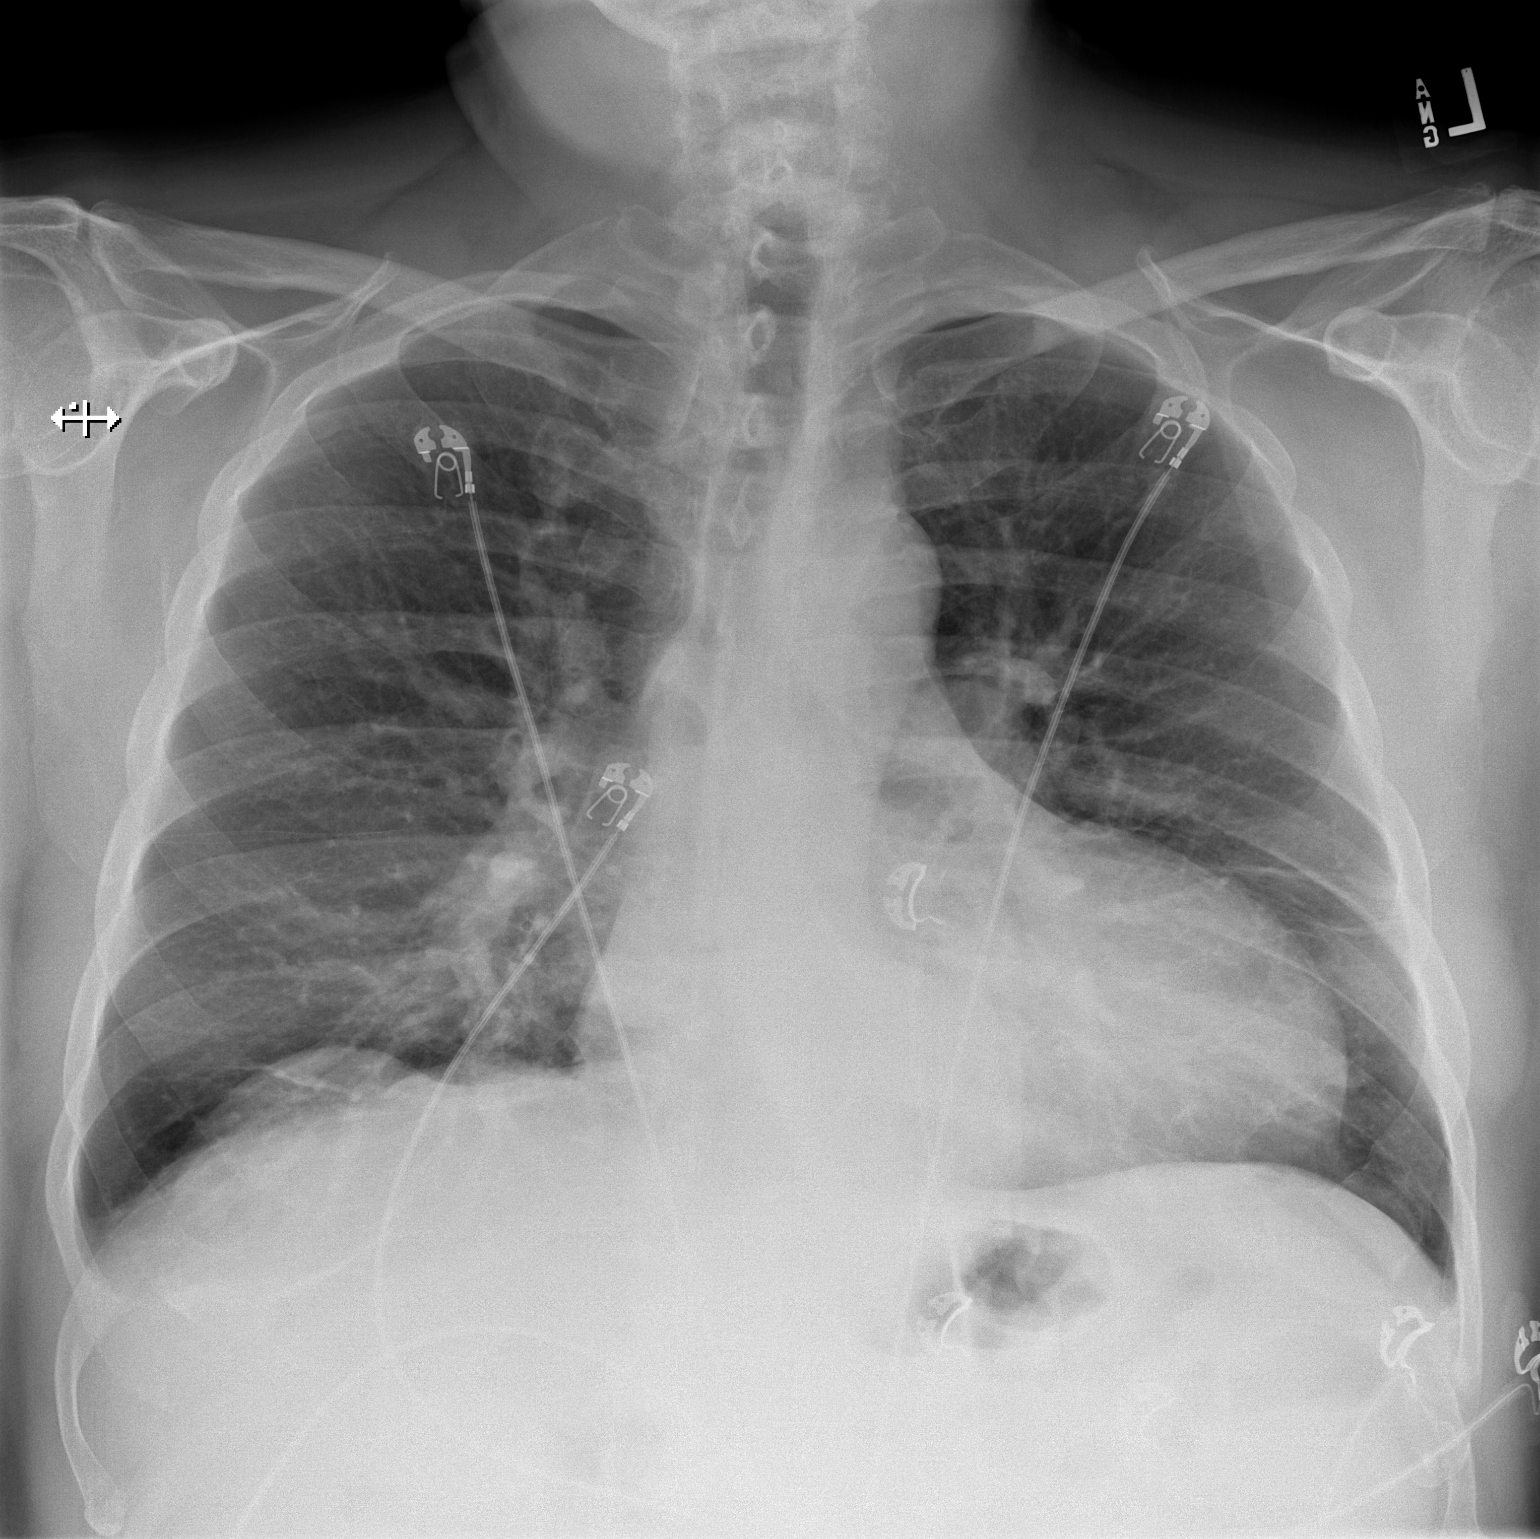

[w chest lat]
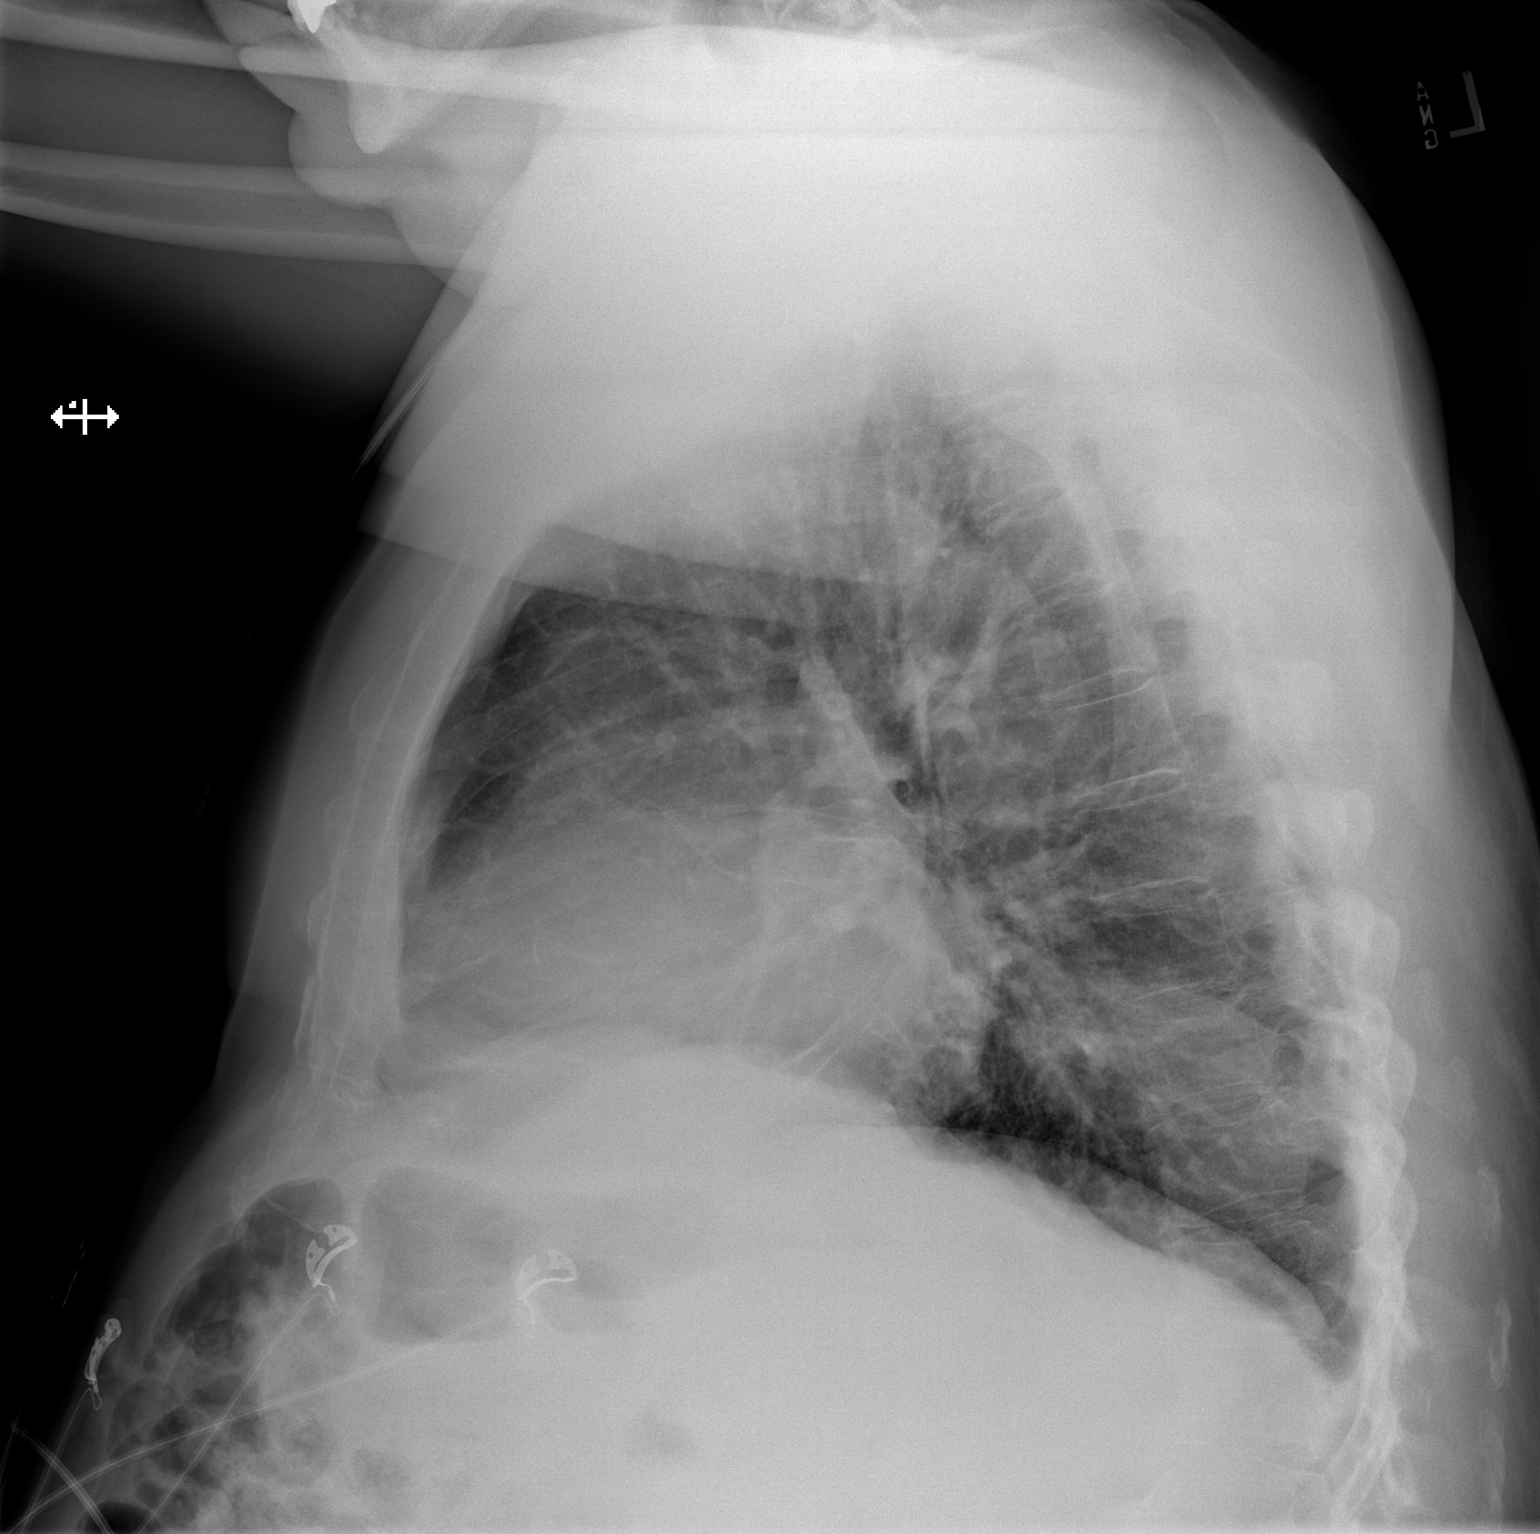

[2 of 2 positions shown; findings below may reference images not displayed]

FINDINGS: There are low lung volumes with central vascular crowding and
additional bandlike opacities in the periphery of the left mid lung
which could reflect subsegmental atelectasis or scarring. More
patchy right infrahilar opacity with airways thickening is present.
No pneumothorax or visible effusion. Cardiomediastinal contours are
unremarkable. No acute osseous or soft tissue abnormality.
Degenerative changes are present in the imaged spine and shoulders.
Telemetry leads overlie the chest.
IMPRESSION: Low volumes and atelectasis with central vascular crowding.

Patchy opacity with airways thickening in the right infrahilar lung
could reflect early infection in the setting of [5T] positivity.

## 2020-07-12 MED ORDER — ACETAMINOPHEN 325 MG PO TABS
650.0000 mg | ORAL_TABLET | Freq: Once | ORAL | Status: AC | PRN
  Administered 2020-07-12: 650 mg via ORAL
  Filled 2020-07-12: qty 2

## 2020-07-12 MED ORDER — SODIUM CHLORIDE 0.9% FLUSH: 3.0000 mL | Freq: Once | INTRAVENOUS | Status: DC

## 2020-07-12 NOTE — ED Triage Notes (Signed)
Patient arrived stating that he has been feeling bad since Friday and tested positive for Covid-19 this week. Reports seen by his PCP and sent here for evaluation due to low oxygen saturation of 89%. Patient 94% in triage. Patient has complaints of shortness of breath.

## 2020-07-13 ENCOUNTER — Emergency Department (HOSPITAL_COMMUNITY)
Admission: EM | Admit: 2020-07-13 | Discharge: 2020-07-13 | Disposition: A | Discharge status: Home / Self Care | Payer: No Typology Code available for payment source | Attending: Emergency Medicine | Admitting: Emergency Medicine

## 2020-07-13 ENCOUNTER — Telehealth: Payer: Self-pay | Admitting: Infectious Diseases

## 2020-07-13 ENCOUNTER — Other Ambulatory Visit: Payer: Self-pay | Admitting: Infectious Diseases

## 2020-07-13 DIAGNOSIS — U071 COVID-19: Secondary | ICD-10-CM

## 2020-07-13 DIAGNOSIS — Z6837 Body mass index (BMI) 37.0-37.9, adult: Secondary | ICD-10-CM

## 2020-07-13 MED ORDER — ALBUTEROL SULFATE HFA 108 (90 BASE) MCG/ACT IN AERS
2.0000 | INHALATION_SPRAY | Freq: Once | RESPIRATORY_TRACT | Status: AC
  Administered 2020-07-13: 2 via RESPIRATORY_TRACT
  Filled 2020-07-13: qty 6.7

## 2020-07-13 NOTE — Telephone Encounter (Signed)
Called to discuss with patient about Covid symptoms and the use of Regeneron, a monoclonal antibody infusion for those with mild to moderate Covid symptoms and at a high risk of hospitalization.  Pt is qualified for this infusion at the [facility] infusion center due to Age >55 and BMI>35.    He was seen at [facility] ED today with SOB x 2d.    He had his March 11th his first Pfizer and 2nd April 1st.   Consent obtained and he is scheduled for infusion Friday.

## 2020-07-13 NOTE — ED Provider Notes (Signed)
[facility]-EMERGENCY DEPT Provider Note   CSN: [id number] Arrival date & time: 07/12/20  1919     History Chief Complaint  Patient presents with  . Shortness of Breath    Ryan Glass is a 64 y.o. male.  The history is provided by the patient and medical records. No language interpreter was used.  Shortness of Breath    64 year old male with significant history of hypertension, CKD, rheumatoid arthritis, ascending aortic aneurysm recently test positive for COVID-19 presenting complaining of shortness of breath.  Patient report for the past 4 to 5 days he has been feeling weak, tired, throbbing headache, and for the past 2 days he also endorsed increase shortness of breath.  Patient also reported having a fever as high as 101 at home improved with Motrin.  He was seen by his PCP yesterday for his symptoms.  During his visit, his O2 sats was noted to be at 89%.  He subsequently tested positive for COVID-19 and PCP recommend patient come to ER for further evaluation.  Today patient states he is feeling better.  He denies any loss of taste or smell denies nausea vomiting or diarrhea denies any significant cough.  He has sleep apnea and uses CPAP.  He denies tobacco abuse.  He denies any recent sick contact.  He has had Covid vaccination with [employer] vaccination in March.  He does not wear oxygen at home.  Furthermore, patient was recently diagnosed with a hydrocele involving his left testicle subsequently developed complication including infection.  He is currently being cared for by his nurse practitioner with regular dressing changes.  He is due to have his wound dressing changed today.  He denies worsening scrotal pain or abdominal pain.    Past Medical History:  Diagnosis Date  . Ascending aortic aneurysm (HCC)    35mm by echo 10-19-2019 and 25mm by Chest CTA 10/09/2019 (both in epic)  . Bilateral lower extremity edema   . Chronic gout without tophus    followed  by dr Sharmon Revere    (06-08-2020  per pt last episode right knee 3 wks ago)  . CKD (chronic kidney disease), stage II    nephrology--- Virgina Norfolk PA (10-29-2019 note in epic scanned in  media)  . Hypertension    followed by cardiology, dr t. turner   (05-14-2018 nuclear study in epic , normal perfusion with nuclear ef 61%)  . Left hydrocele   . OSA on CPAP    per pt uses every night  . Palpitations followed by dr t. Mayford Knife   06-08-2020  still feels palipations due to PVCs when exertion but not with chest pain/ discomfort  . PVC's (premature ventricular contractions) cardiologist--- dr t. turner   Status post PVC ablation by Dr. Ladona Ridgel 2019 with recurrence of frequent PVCs/bigeminy;  prior pseudobradycardia r/t pvcs  . Rheumatoid arthritis involving multiple sites [facility])    rheumotology--- dr a. Sharmon Revere  ([facility] in [place])    Patient Active Problem List   Diagnosis Date Noted  . Ascending aortic aneurysm (HCC)   . Testicular swelling 05/14/2019  . Pain in right knee 01/29/2019  . PVC's (premature ventricular contractions)   . Thoracic aortic aneurysm (HCC)   . Hyperlipidemia LDL goal <70   . PVC (premature ventricular contraction) 10/07/2018  . Dilated aortic root (HCC)   . Hip pain, acute, right 10/29/2016  . Allergic rhinitis 01/10/2016  . Chronic gout without tophus 01/10/2016  . Drug therapy 01/10/2016  . OSA (obstructive  sleep apnea) 01/10/2016  . Colonic polyp 12/14/2015  . Rt groin pain 12/14/2015  . Scoliosis of thoracolumbar spine 12/14/2015  . Right lower quadrant abdominal pain 04/20/2015  . Arthritis of knee 02/22/2015  . Edema of extremities 02/22/2015  . Essential hypertension 02/22/2015  . Left-sided low back pain without sciatica 02/22/2015  . Preventative health care 02/22/2015  . Low serum potassium level 04/27/2014  . Sleep apnea 04/27/2014  . Bradycardia by electrocardiogram 04/16/2014  . Chest pain 04/16/2014    Past Surgical History:  Procedure  Laterality Date  . HYDROCELE EXCISION Left 06/14/2020   Procedure: LEFT  HYDROCELECTOMY ADULT;  Surgeon: Noel Christmas, MD;  Location[facility];  Service: Urology;  Laterality: Left;  . INCISIONAL HERNIA REPAIR  02-23-2016   @[facility]    LAPAROSCOPIC  . LAPAROSCOPIC INGUINAL HERNIA REPAIR Bilateral 08-22-2015  @[facility]    AND UMBILICAL HERNIA REPAIR  . PVC ABLATION N/A 10/07/2018   Procedure: PVC ABLATION;  Surgeon: Marinus Maw, MD;  Location: [facility] INVASIVE CV [facility];  Service: Cardiovascular;  Laterality: N/A;  . UMBILICAL HERNIA REPAIR  child       Family History  Problem Relation Age of Onset  . Cardiomyopathy Mother   . Heart attack Father     Social History   Tobacco Use  . Smoking status: Never Smoker  . Smokeless tobacco: Never Used  Vaping Use  . Vaping Use: Never used  Substance Use Topics  . Alcohol use: Yes    Alcohol/week: 5.0 - 7.0 standard drinks    Types: 5 - 7 Cans of beer per week  . Drug use: Never    Home Medications Prior to Admission medications   Medication Sig Start Date End Date Taking? Authorizing Provider  acetaminophen-codeine (TYLENOL #3) 300-30 MG tablet 1-2 tablets as needed.  05/06/19   [provider]  allopurinol (ZYLOPRIM) 100 MG tablet Take 100 mg by mouth at bedtime.  03/03/20   [provider]  amLODipine (NORVASC) 5 MG tablet Take 1 tablet (5 mg total) by mouth 2 (two) times daily. 04/14/20 05/29/20  Betancourt, Jarold Song, NP  atorvastatin (LIPITOR) 20 MG tablet Take 1 tablet (20 mg total) by mouth daily. Patient taking differently: Take 20 mg by mouth at bedtime.  11/12/19   Quintella Reichert, MD  chlorthalidone (HYGROTON) 25 MG tablet Take 1 tablet (25 mg total) by mouth daily. 10/23/19   Dunn, Tacey Ruiz, PA-C  colchicine 0.6 MG tablet Take 1-2 tablets by mouth daily as needed (gout).  01/31/16   [provider]  docusate sodium (COLACE) 100 MG capsule Take 1 capsule (100 mg total) by mouth 2 (two) times daily.  06/14/20 06/14/21  Noel Christmas, MD  fluticasone (FLONASE) 50 MCG/ACT nasal spray SPRAY ONE SPRAY IN EACH NOSTRIL TWICE DAILY Patient taking differently: Place 1 spray into both nostrils in the morning and at bedtime.  03/22/20   Betancourt, Jarold Song, NP  folic acid (FOLVITE) 1 MG tablet Take 1 tablet by mouth daily. 01/29/20   [provider]  gabapentin (NEURONTIN) 600 MG tablet Take 600 mg by mouth 3 (three) times daily.  02/04/19   [provider]  hydrALAZINE (APRESOLINE) 100 MG tablet Take 1 tablet (100 mg total) by mouth 3 (three) times daily. Patient taking differently: Take 100 mg by mouth 3 (three) times daily.  10/26/19   Dunn, Tacey Ruiz, PA-C  HYDROcodone-acetaminophen (NORCO/VICODIN) 5-325 MG tablet Take 1 tablet by mouth every 4 (four) hours as  needed for moderate pain. 06/14/20 06/14/21  Noel Christmas, MD  KLOR-CON M20 20 MEQ tablet TAKE 2 TABLETS BY MOUTH TWO TIMES A DAY 02/29/20   Dunn, Dayna N, PA-C  losartan (COZAAR) 100 MG tablet TAKE ONE TABLET BY MOUTH DAILY Patient taking differently: Take 100 mg by mouth daily.  02/29/20   Dunn, Dayna N, PA-C  montelukast (SINGULAIR) 10 MG tablet TAKE ONE TABLET BY MOUTH EVERY NIGHT AT BEDTIME 05/27/20   Betancourt, Inetta Fermo A, NP  nebivolol (BYSTOLIC) 5 MG tablet Take 2 tablets (10 mg total) by mouth daily. Patient taking differently: Take 10 mg by mouth at bedtime.  10/30/19   Dunn, Tacey Ruiz, PA-C  predniSONE (DELTASONE) 5 MG tablet Take 10 mg by mouth daily.  01/29/20   [provider]    Allergies    Amlodipine besylate and Aspirin  Review of Systems   Review of Systems  Respiratory: Positive for shortness of breath.   All other systems reviewed and are negative.   Physical Exam Updated Vital Signs BP (!) 148/74 (BP Location: Right Arm)   Pulse 77   Temp 100.3 F (37.9 C) (Oral)   Resp (!) 22   Ht 5\' 8"  (1.727 m)   Wt 111.6 kg   SpO2 95%   BMI 37.40 kg/m   Physical Exam Vitals and nursing note reviewed.    Constitutional:      General: He is not in acute distress.    Appearance: He is well-developed.  HENT:     Head: Atraumatic.  Eyes:     Conjunctiva/sclera: Conjunctivae normal.  Cardiovascular:     Rate and Rhythm: Normal rate and regular rhythm.     Pulses: Normal pulses.     Heart sounds: Normal heart sounds.  Pulmonary:     Effort: Pulmonary effort is normal.     Breath sounds: Normal breath sounds. No wheezing, rhonchi or rales.  Abdominal:     Palpations: Abdomen is soft.     Tenderness: There is no abdominal tenderness.  Genitourinary:    Comments: Chaperone Abby Wooden, RN, was present during scrotal examination.  Left scrotum with an open wound with packing in place.  Packing removed, good granular tissue noted. Musculoskeletal:     Cervical back: Neck supple.  Skin:    Findings: No rash.  Neurological:     Mental Status: He is alert and oriented to person, place, and time.  Psychiatric:        Mood and Affect: Mood normal.     ED Results / Procedures / Treatments   Labs (all labs ordered are listed, but only abnormal results are displayed) Labs Reviewed  COMPREHENSIVE METABOLIC PANEL - Abnormal; Notable for the following components:      Result Value   Sodium 134 (*)    BUN 25 (*)    Creatinine, Ser 1.48 (*)    Calcium 8.5 (*)    GFR calc non Af Amer 49 (*)    GFR calc Af Amer 57 (*)    All other components within normal limits  CBC WITH DIFFERENTIAL/PLATELET - Abnormal; Notable for the following components:   RBC 3.77 (*)    Hemoglobin 12.8 (*)    HCT 37.9 (*)    MCV 100.5 (*)    Lymphs Abs 0.5 (*)    All other components within normal limits    EKG EKG Interpretation  Date/Time:  Tuesday July 12 2020 20:20:47 EDT Ventricular Rate:  74 PR Interval:  QRS Duration: 162 QT Interval:  424 QTC Calculation: 471 R Axis:   92 Text Interpretation: Sinus rhythm Supraventricular bigeminy Borderline prolonged PR interval Probable left atrial  enlargement Right bundle branch block Baseline wander in lead(s) V2 12 Lead; Mason-Likar with QRS widening Otherwise no significant change Confirmed by Melene Plan [phone]) on 07/13/2020 12:16:51 AM   Radiology DG Chest 2 View  Result Date: 07/12/2020 CLINICAL DATA:  COVID-19 positive, shortness of breath EXAM: CHEST - 2 VIEW COMPARISON:  CT 10/09/2019 FINDINGS: There are low lung volumes with central vascular crowding and additional bandlike opacities in the periphery of the left mid lung which could reflect subsegmental atelectasis or scarring. More patchy right infrahilar opacity with airways thickening is present. No pneumothorax or visible effusion. Cardiomediastinal contours are unremarkable. No acute osseous or soft tissue abnormality. Degenerative changes are present in the imaged spine and shoulders. Telemetry leads overlie the chest. IMPRESSION: Low volumes and atelectasis with central vascular crowding. Patchy opacity with airways thickening in the right infrahilar lung could reflect early infection in the setting of COVID-19 positivity. Electronically Signed   By: Kreg Shropshire M.D.   On: 07/12/2020 21:08    Procedures Procedures (including critical care time)  Medications Ordered in ED Medications  sodium chloride flush (NS) 0.9 % injection 3 mL (has no administration in time range)  albuterol (VENTOLIN HFA) 108 (90 Base) MCG/ACT inhaler 2 puff (has no administration in time range)  acetaminophen (TYLENOL) tablet 650 mg (650 mg Oral Given 07/12/20 2014)    ED Course  I have reviewed the triage vital signs and the nursing notes.  Pertinent labs & imaging results that were available during my care of the patient were reviewed by me and considered in my medical decision making (see chart for details).    MDM Rules/Calculators/A&P                          BP 102/80   Pulse 70   Temp 100.3 F (37.9 C) (Oral)   Resp 20   Ht 5\' 8"  (1.727 m)   Wt 111.6 kg   SpO2 98%   BMI 37.40 kg/m    Final Clinical Impression(s) / ED Diagnoses Final diagnoses:  COVID-19 virus infection    Rx / DC Orders ED Discharge Orders    None     10:05 AM Patient presents due to tested positive for COVID-19 at his PCP office and was noted to be hypoxic with an O2 sats of 89% on room air during his visit yesterday.  At this time patient states he is feeling better and denies any significant shortness of breath.  He does not have any evidence of hypoxia in the ED, O2 sats is 97% on room air.  He was febrile but improved with antipyretic.  Chest x-ray does shows patchy opacity with airway thickening in the right infrahilar lung which could reflect early infection in the setting of COVID-19.  Labs otherwise at his baseline.  Patient request to be discharged.  I did reach out to our monoclonal antibody infusion center as patient is a potential candidate for infusion.  They will reach out to him.  Patient also has a wound to his scrotum that is needed to have his dressing changed.  I was able to change this wet-to-dry wound dressing and applied another ABD.  The wound is well in appearance.   Ryan Glass was evaluated in Emergency Department on 07/13/2020 for the  symptoms described in the history of present illness. He was evaluated in the context of the global COVID-19 pandemic, which necessitated consideration that the patient might be at risk for infection with the SARS-CoV-2 virus that causes COVID-19. Institutional protocols and algorithms that pertain to the evaluation of patients at risk for COVID-19 are in a state of rapid change based on information released by regulatory bodies including the CDC and federal and state organizations. These policies and algorithms were followed during the patient's care in the ED.    Fayrene Helper, PA-C 07/13/20 1025    Arby Barrette, MD 07/13/20 1325

## 2020-07-13 NOTE — Medical Student Note (Signed)
[facility]-EMERGENCY DEPT Provider Student Note For educational purposes for Medical, PA and NP students only and not part of the legal medical record.   CSN: [id number] Arrival date & time: 07/12/20  1919      History   Chief Complaint Chief Complaint  Patient presents with  . Shortness of Breath    HPI Ryan Glass is a 64 y.o. male here for SOB after testing positive for COVID on Friday k after noticing fatigue and headache for the past week. He was vaccinated in March and denies any sick contacts. Reports that his O2 sat was 89% at PCP. He denies significant coughing and sore throat, endorses some rhinorrhea. He took one Motrin tablet QD for the past 2 days for a fever of 100.7. Has not taken any other medications. He states that he feels much better since last week. He would like to be discharged but is also requesting a surgical wound in his scrotum to be packed, per his ortho PA.  Other relevant medical hx includes: OSA for which he uses a CPAP. He wants to know if this is OK to continue using.   Lab abnormalities: slightly anemic (12.8) elevated BUN and creatinine (CKD), calcium a little low  CXR:   HPI  Past Medical History:  Diagnosis Date  . Ascending aortic aneurysm (HCC)    41mm by echo 10-19-2019 and 22mm by Chest CTA 10/09/2019 (both in epic)  . Bilateral lower extremity edema   . Chronic gout without tophus    followed by dr Sharmon Revere    (06-08-2020  per pt last episode right knee 3 wks ago)  . CKD (chronic kidney disease), stage II    nephrology--- [facility]gina Norfolk PA (10-29-2019 note in epic scanned in  media)  . Hypertension    followed by cardiology, dr t. turner   (05-14-2018 nuclear study in epic , normal perfusion with nuclear ef 61%)  . Left hydrocele   . OSA on CPAP    per pt uses every night  . Palpitations followed by dr t. Mayford Knife   06-08-2020  still feels palipations due to PVCs when exertion but not with chest pain/ discomfort  . PVC's (premature  ventricular contractions) cardiologist--- dr t. turner   Status post PVC ablation by Dr. Ladona Ridgel 2019 with recurrence of frequent PVCs/bigeminy;  prior pseudobradycardia r/t pvcs  . Rheumatoid arthritis involving multiple sites [facility])    rheumotology--- dr a. Sharmon Revere  ([facility] in [place])    Patient Active Problem List   Diagnosis Date Noted  . Ascending aortic aneurysm (HCC)   . Testicular swelling 05/14/2019  . Pain in right knee 01/29/2019  . PVC's (premature ventricular contractions)   . Thoracic aortic aneurysm (HCC)   . Hyperlipidemia LDL goal <70   . PVC (premature ventricular contraction) 10/07/2018  . Dilated aortic root (HCC)   . Hip pain, acute, right 10/29/2016  . Allergic rhinitis 01/10/2016  . Chronic gout without tophus 01/10/2016  . Drug therapy 01/10/2016  . OSA (obstructive sleep apnea) 01/10/2016  . Colonic polyp 12/14/2015  . Rt groin pain 12/14/2015  . Scoliosis of thoracolumbar spine 12/14/2015  . Right lower quadrant abdominal pain 04/20/2015  . Arthritis of knee 02/22/2015  . Edema of extremities 02/22/2015  . Essential hypertension 02/22/2015  . Left-sided low back pain without sciatica 02/22/2015  . Preventative health care 02/22/2015  . Low serum potassium level 04/27/2014  . Sleep apnea 04/27/2014  . Bradycardia by electrocardiogram 04/16/2014  . Chest pain  04/16/2014    Past Surgical History:  Procedure Laterality Date  . HYDROCELE EXCISION Left 06/14/2020   Procedure: LEFT  HYDROCELECTOMY ADULT;  Surgeon: Noel Christmas, MD;  Location: [facility];  Service: Urology;  Laterality: Left;  . INCISIONAL HERNIA REPAIR  02-23-2016   @[facility]    LAPAROSCOPIC  . LAPAROSCOPIC INGUINAL HERNIA REPAIR Bilateral 08-22-2015  @[facility]    AND UMBILICAL HERNIA REPAIR  . PVC ABLATION N/A 10/07/2018   Procedure: PVC ABLATION;  Surgeon: , MD;  Location: [facility] INVAS[facility];  Service: Cardiovascular;  Laterality: N/A;  . UMBILICAL HERNIA REPAIR   child       Home Medications    Prior to Admission medications   Medication Sig Start Date End Date Taking? Authorizing Provider  acetaminophen-codeine (TYLENOL #3) 300-30 MG tablet 1-2 tablets as needed.  05/06/19   [provider]  allopurinol (ZYLOPRIM) 100 MG tablet Take 100 mg by mouth at bedtime.  03/03/20   [provider]  amLODipine (NORVASC) 5 MG tablet Take 1 tablet (5 mg total) by mouth 2 (two) times daily. 04/14/20 05/29/20  Betancourt, 06/14/20, NP  atorvastatin (LIPITOR) 20 MG tablet Take 1 tablet (20 mg total) by mouth daily. Patient taking differently: Take 20 mg by mouth at bedtime.  11/12/19   Jarold Song, MD  chlorthalidone (HYGROTON) 25 MG tablet Take 1 tablet (25 mg total) by mouth daily. 10/23/19   Dunn, Quintella Reichert, PA-C  colchicine 0.6 MG tablet Take 1-2 tablets by mouth daily as needed (gout).  01/31/16   [provider]  docusate sodium (COLACE) 100 MG capsule Take 1 capsule (100 mg total) by mouth 2 (two) times daily. 06/14/20 06/14/21  08/15/20, MD  fluticasone (FLONASE) 50 MCG/ACT nasal spray SPRAY ONE SPRAY IN EACH NOSTRIL TWICE DAILY Patient taking differently: Place 1 spray into both nostrils in the morning and at bedtime.  03/22/20   Betancourt, Noel Christmas, NP  folic acid (FOLVITE) 1 MG tablet Take 1 tablet by mouth daily. 01/29/20   [provider]  gabapentin (NEURONTIN) 600 MG tablet Take 600 mg by mouth 3 (three) times daily.  02/04/19   [provider]  hydrALAZINE (APRESOLINE) 100 MG tablet Take 1 tablet (100 mg total) by mouth 3 (three) times daily. Patient taking differently: Take 100 mg by mouth 3 (three) times daily.  10/26/19   Dunn, 02/06/19, PA-C  HYDROcodone-acetaminophen (NORCO/VICODIN) 5-325 MG tablet Take 1 tablet by mouth every 4 (four) hours as needed for moderate pain. 06/14/20 06/14/21  08/15/20, MD  KLOR-CON M20 20 MEQ tablet TAKE 2 TABLETS BY MOUTH TWO TIMES A DAY 02/29/20   Dunn, Dayna N, PA-C   losartan (COZAAR) 100 MG tablet TAKE ONE TABLET BY MOUTH DAILY Patient taking differently: Take 100 mg by mouth daily.  02/29/20   Dunn, Dayna N, PA-C  montelukast (SINGULAIR) 10 MG tablet TAKE ONE TABLET BY MOUTH EVERY NIGHT AT BEDTIME 05/27/20   Betancourt, 03/02/20 A, NP  nebivolol (BYSTOLIC) 5 MG tablet Take 2 tablets (10 mg total) by mouth daily. Patient taking differently: Take 10 mg by mouth at bedtime.  10/30/19   Dunn, Inetta Fermo, PA-C  predniSONE (DELTASONE) 5 MG tablet Take 10 mg by mouth daily.  01/29/20   [provider]    Family History Family History  Problem Relation Age of Onset  . Cardiomyopathy Mother   . Heart attack Father     Social History Social History  Tobacco Use  . Smoking status: Never Smoker  . Smokeless tobacco: Never Used  Vaping Use  . Vaping Use: Never used  Substance Use Topics  . Alcohol use: Yes    Alcohol/week: 5.0 - 7.0 standard drinks    Types: 5 - 7 Cans of beer per week  . Drug use: Never     Allergies   Amlodipine besylate and Aspirin   Review of Systems Review of Systems   Physical Exam Updated Vital Signs BP (!) 148/74 (BP Location: Right Arm)   Pulse 77   Temp 100.3 F (37.9 C) (Oral)   Resp (!) 22   Ht 5\' 8"  (1.727 m)   Wt 111.6 kg   SpO2 95%   BMI 37.40 kg/m   Physical Exam Heart  Lungs   ED Treatments / Results  Labs (all labs ordered are listed, but only abnormal results are displayed) Labs Reviewed  COMPREHENSIVE METABOLIC PANEL - Abnormal; Notable for the following components:      Result Value   Sodium 134 (*)    BUN 25 (*)    Creatinine, Ser 1.48 (*)    Calcium 8.5 (*)    GFR calc non Af Amer 49 (*)    GFR calc Af Amer 57 (*)    All other components within normal limits  CBC WITH DIFFERENTIAL/PLATELET - Abnormal; Notable for the following components:   RBC 3.77 (*)    Hemoglobin 12.8 (*)    HCT 37.9 (*)    MCV 100.5 (*)    Lymphs Abs 0.5 (*)    All other components within normal limits     EKG  Radiology DG Chest 2 View  Result Date: 07/12/2020 CLINICAL DATA:  COVID-19 positive, shortness of breath EXAM: CHEST - 2 VIEW COMPARISON:  CT 10/09/2019 FINDINGS: There are low lung volumes with central vascular crowding and additional bandlike opacities in the periphery of the left mid lung which could reflect subsegmental atelectasis or scarring. More patchy right infrahilar opacity with airways thickening is present. No pneumothorax or visible effusion. Cardiomediastinal contours are unremarkable. No acute osseous or soft tissue abnormality. Degenerative changes are present in the imaged spine and shoulders. Telemetry leads overlie the chest. IMPRESSION: Low volumes and atelectasis with central vascular crowding. Patchy opacity with airways thickening in the right infrahilar lung could reflect early infection in the setting of COVID-19 positivity. Electronically Signed   By: Kreg Shropshire M.D.   On: 07/12/2020 21:08    Procedures Procedures (including critical care time)  Medications Ordered in ED Medications  sodium chloride flush (NS) 0.9 % injection 3 mL (has no administration in time range)  acetaminophen (TYLENOL) tablet 650 mg (650 mg Oral Given 07/12/20 2014)     Initial Impression / Assessment and Plan / ED Course  I have reviewed the triage vital signs and the nursing notes.  Pertinent labs & imaging results that were available during my care of the patient were reviewed by me and considered in my medical decision making (see chart for details).   Final Clinical Impressions(s) / ED Diagnoses   Final diagnoses:  None    New Prescriptions New Prescriptions   No medications on file

## 2020-07-13 NOTE — ED Notes (Addendum)
Ambulated pt around room to monitor O2.  O2 remained between 93 and 94.

## 2020-07-13 NOTE — Progress Notes (Signed)
I connected by phone with Ryan Glass on 07/13/2020 at 4:55 PM to discuss the potential use of a new treatment for mild to moderate COVID-19 viral infection in non-hospitalized patients.  This patient is a 64 y.o. male that meets the FDA criteria for Emergency Use Authorization of COVID monoclonal antibody casirivimab/imdevimab.  Has a (+) direct SARS-CoV-2 viral test result  Has mild or moderate COVID-19   Is NOT hospitalized due to COVID-19  Is within 10 days of symptom onset  Has at least one of the high risk factor(s) for progression to severe COVID-19 and/or hospitalization as defined in EUA.  Specific high risk criteria : BMI > 25   I have spoken and communicated the following to the patient or parent/caregiver regarding COVID monoclonal antibody treatment:  1. FDA has authorized the emergency use for the treatment of mild to moderate COVID-19 in adults and pediatric patients with positive results of direct SARS-CoV-2 viral testing who are 2 years of age and older weighing at least 40 kg, and who are at high risk for progressing to severe COVID-19 and/or hospitalization.  2. The significant known and potential risks and benefits of COVID monoclonal antibody, and the extent to which such potential risks and benefits are unknown.  3. Information on available alternative treatments and the risks and benefits of those alternatives, including clinical trials.  4. Patients treated with COVID monoclonal antibody should continue to self-isolate and use infection control measures (e.g., wear mask, isolate, social distance, avoid sharing personal items, clean and disinfect "high touch" surfaces, and frequent handwashing) according to CDC guidelines.   5. The patient or parent/caregiver has the option to accept or refuse COVID monoclonal antibody treatment.  After reviewing this information with the patient, The patient agreed to proceed with receiving casirivimab\imdevimab infusion and  will be provided a copy of the Fact sheet prior to receiving the infusion.   [name] 07/13/2020 4:55 PM

## 2020-07-13 NOTE — ED Notes (Signed)
Pt in bed resting comfortably. No needs expressed at this time.

## 2020-07-13 NOTE — ED Notes (Signed)
Greta Doom, PA at bedside. Scrotal wound dressing changed.

## 2020-07-13 NOTE — Discharge Instructions (Signed)
You have been diagnosed with COVID-19 infection.  Use albuterol inhaler 2 puffs every 4 hours as needed for shortness of breath.  Take Tylenol or ibuprofen at home as needed for fever and body aches.  Follow instruction below.  I have reached out to the infusion center to see if you are a candidate for outpatient treatment.  You will likely receive a phone call from the center in the next several days for an appointment if you qualify.  Please purchase a oximeter to monitor your oxygen level at home.  If you notice also level is below 90 and you are having worsening shortness of breath, please return to the ER for further management.

## 2020-07-14 MED ORDER — SODIUM CHLORIDE 0.9 % IV SOLN
Freq: Once | INTRAVENOUS | Status: AC
  Filled 2020-07-14: qty 600

## 2020-07-15 ENCOUNTER — Ambulatory Visit (HOSPITAL_COMMUNITY)
Admission: RE | Admit: 2020-07-15 | Discharge: 2020-07-15 | Disposition: A | Discharge status: Home / Self Care | Payer: PRIVATE HEALTH INSURANCE | Source: Ambulatory Visit | Attending: Pulmonary Disease | Admitting: Pulmonary Disease

## 2020-07-15 DIAGNOSIS — Z6837 Body mass index (BMI) 37.0-37.9, adult: Secondary | ICD-10-CM | POA: Diagnosis not present

## 2020-07-15 DIAGNOSIS — U071 COVID-19: Secondary | ICD-10-CM | POA: Insufficient documentation

## 2020-07-15 MED ORDER — SODIUM CHLORIDE 0.9 % IV SOLN: INTRAVENOUS | Status: DC | PRN

## 2020-07-15 MED ORDER — EPINEPHRINE 0.3 MG/0.3ML IJ SOAJ: 0.3000 mg | Freq: Once | INTRAMUSCULAR | Status: DC | PRN

## 2020-07-15 MED ORDER — DIPHENHYDRAMINE HCL 50 MG/ML IJ SOLN: 50.0000 mg | Freq: Once | INTRAMUSCULAR | Status: DC | PRN

## 2020-07-15 MED ORDER — ALBUTEROL SULFATE HFA 108 (90 BASE) MCG/ACT IN AERS: 2.0000 | INHALATION_SPRAY | Freq: Once | RESPIRATORY_TRACT | Status: DC | PRN

## 2020-07-15 MED ORDER — FAMOTIDINE IN NACL 20-0.9 MG/50ML-% IV SOLN: 20.0000 mg | Freq: Once | INTRAVENOUS | Status: DC | PRN

## 2020-07-15 MED ORDER — METHYLPREDNISOLONE SODIUM SUCC 125 MG IJ SOLR: 125.0000 mg | Freq: Once | INTRAMUSCULAR | Status: DC | PRN

## 2020-07-15 NOTE — Progress Notes (Signed)
Wound care performed to patient's left anterior scrotum. Wound packed with 2, 2x2 moist gauzes and covered with an abdominal pad. Pt tolerated fine.

## 2020-07-15 NOTE — Discharge Instructions (Signed)

## 2020-07-15 NOTE — Progress Notes (Signed)
  Diagnosis: COVID-19  Physician:dr patrick wright   Procedure: [facility] Med: casirivimab\imdevimab infusion - Provided patient with casirivimab\imdevimab fact sheet for patients, parents and caregivers prior to infusion.  Complications: No immediate complications noted.  Discharge: Discharged home   Ryan Glass R 07/15/2020   

## 2020-08-03 ENCOUNTER — Other Ambulatory Visit: Payer: Self-pay | Admitting: Cardiology

## 2020-08-09 ENCOUNTER — Encounter: Payer: Self-pay | Admitting: Registered Nurse

## 2020-08-09 ENCOUNTER — Telehealth: Payer: Self-pay | Admitting: Registered Nurse

## 2020-08-09 NOTE — Telephone Encounter (Signed)
Patient returned to work s/p hydrocelectomy, post op surgical infection and covid 19 infection received monoclonal antibodies.  Patient to send me image of his vaccination card as he states he received third dose covid vaccine after monoclonal antibodies at [facility].  Discussed with patient he will probably need to repeat his third dose since given within days of monoclonal antibody infusion and I would consult with ID Dr Drue Second to verify.  Patient to send me picture of his vaccination card to upload to epic.  Patient positive test 07/11/2020 at [facility].  Seen at [facility] this week also for ear pain and was diagnosed with bronchitis and given azithromycin for 5 days.  Still having right ear discomfort requested appt with NP Thursday at 1130 scheduled for patient.

## 2020-08-11 ENCOUNTER — Other Ambulatory Visit: Payer: Self-pay

## 2020-08-11 ENCOUNTER — Ambulatory Visit: Payer: Self-pay | Admitting: Registered Nurse

## 2020-08-11 ENCOUNTER — Encounter: Payer: Self-pay | Admitting: Registered Nurse

## 2020-08-11 VITALS — BP 114/72 | HR 71 | Temp 98.2°F

## 2020-08-11 DIAGNOSIS — H6993 Unspecified Eustachian tube disorder, bilateral: Secondary | ICD-10-CM

## 2020-08-11 DIAGNOSIS — G8929 Other chronic pain: Secondary | ICD-10-CM

## 2020-08-11 DIAGNOSIS — J301 Allergic rhinitis due to pollen: Secondary | ICD-10-CM

## 2020-08-11 DIAGNOSIS — M79672 Pain in left foot: Secondary | ICD-10-CM

## 2020-08-11 DIAGNOSIS — H6983 Other specified disorders of Eustachian tube, bilateral: Secondary | ICD-10-CM

## 2020-08-11 MED ORDER — LORATADINE 10 MG PO TABS
10.0000 mg | ORAL_TABLET | Freq: Every day | ORAL | 3 refills | Status: DC
Start: 2020-08-11 — End: 2021-05-26

## 2020-08-11 NOTE — Patient Instructions (Signed)
How to Perform a Sinus Rinse A sinus rinse is a home treatment that is used to rinse your sinuses with a sterile mixture of salt and water (saline solution). Sinuses are air-filled spaces in your skull behind the bones of your face and forehead that open into your nasal cavity. A sinus rinse can help to clear mucus, dirt, dust, or pollen from your nasal cavity. You may do a sinus rinse when you have a cold, a virus, nasal allergy symptoms, a sinus infection, or stuffiness in your nose or sinuses. Talk with your health care provider about whether a sinus rinse might help you. What are the risks? A sinus rinse is generally safe and effective. However, there are a few risks, which include:  A burning sensation in your sinuses. This may happen if you do not make the saline solution as directed. Be sure to follow all directions when making the saline solution.  Nasal irritation.  Infection from contaminated water. This is rare, but possible. Do not do a sinus rinse if you have had ear or nasal surgery, ear infection, or blocked ears. Supplies needed:  Saline solution or powder.  Distilled or sterile water may be needed to mix with saline powder. ? You may use boiled and cooled tap water. Boil tap water for 5 minutes; cool until it is lukewarm. Use within 24 hours. ? Do not use regular tap water to mix with the saline solution.  Neti pot or nasal rinse bottle. These supplies release the saline solution into your nose and through your sinuses. Neti pots and nasal rinse bottles can be purchased at [employer], a health food store, or online. How to perform a sinus rinse  1. Wash your hands with soap and water. 2. Wash your device according to the directions that came with the product and then dry it. 3. Use the solution that comes with your product or one that is sold separately in stores. Follow the mixing directions on the package if you need to mix with sterile or distilled  water. 4. Fill the device with the amount of saline solution noted in the device instructions. 5. Stand over a sink and tilt your head sideways over the sink. 6. Place the spout of the device in your upper nostril (the one closer to the ceiling). 7. Gently pour or squeeze the saline solution into your nasal cavity. The liquid should drain out from the lower nostril if you are not too congested. 8. While rinsing, breathe through your open mouth. 9. Gently blow your nose to clear any mucus and rinse solution. Blowing too hard may cause ear pain. 10. Repeat in your other nostril. 11. Clean and rinse your device with clean water and then air-dry it. Talk with your health care provider or pharmacist if you have questions about how to do a sinus rinse. Summary  A sinus rinse is a home treatment that is used to rinse your sinuses with a sterile mixture of salt and water (saline solution).  A sinus rinse is generally safe and effective. Follow all instructions carefully.  Before doing a sinus rinse, talk with your health care provider about whether it would be helpful for you. This information is not intended to replace advice given to you by your health care provider. Make sure you discuss any questions you have with your health care provider. Document Revised: 09/23/2017 Document Reviewed: 09/23/2017 Elsevier Patient Education  [place]. Allergic Rhinitis, Adult Allergic rhinitis is an allergic reaction  that affects the mucous membrane inside the nose. It causes sneezing, a runny or stuffy nose, and the feeling of mucus going down the back of the throat (postnasal drip). Allergic rhinitis can be mild to severe. There are two types of allergic rhinitis:  Seasonal. This type is also called hay fever. It happens only during certain seasons.  Perennial. This type can happen at any time of the year. What are the causes? This condition happens when the body's defense system (immune system)  responds to certain harmless substances called allergens as though they were germs.  Seasonal allergic rhinitis is triggered by pollen, which can come from grasses, trees, and weeds. Perennial allergic rhinitis may be caused by:  House dust mites.  Pet dander.  Mold spores. What are the signs or symptoms? Symptoms of this condition include:  Sneezing.  Runny or stuffy nose (nasal congestion).  Postnasal drip.  Itchy nose.  Tearing of the eyes.  Trouble sleeping.  Daytime sleepiness. How is this diagnosed? This condition may be diagnosed based on:  Your medical history.  A physical exam.  Tests to check for related conditions, such as: ? Asthma. ? Pink eye. ? Ear infection. ? Upper respiratory infection.  Tests to find out which allergens trigger your symptoms. These may include skin or blood tests. How is this treated? There is no cure for this condition, but treatment can help control symptoms. Treatment may include:  Taking medicines that block allergy symptoms, such as antihistamines. Medicine may be given as a shot, nasal spray, or pill.  Avoiding the allergen.  Desensitization. This treatment involves getting ongoing shots until your body becomes less sensitive to the allergen. This treatment may be done if other treatments do not help.  If taking medicine and avoiding the allergen does not work, new, stronger medicines may be prescribed. Follow these instructions at home:  Find out what you are allergic to. Common allergens include smoke, dust, and pollen.  Avoid the things you are allergic to. These are some things you can do to help avoid allergens: ? Replace carpet with wood, tile, or vinyl flooring. Carpet can trap dander and dust. ? Do not smoke. Do not allow smoking in your home. ? Change your heating and air conditioning filter at least once a month. ? During allergy season:  Keep windows closed as much as possible.  Plan outdoor activities  when pollen counts are lowest. This is usually during the evening hours.  When coming indoors, change clothing and shower before sitting on furniture or bedding.  Take over-the-counter and prescription medicines only as told by your health care provider.  Keep all follow-up visits as told by your health care provider. This is important. Contact a health care provider if:  You have a fever.  You develop a persistent cough.  You make whistling sounds when you breathe (you wheeze).  Your symptoms interfere with your normal daily activities. Get help right away if:  You have shortness of breath. Summary  This condition can be managed by taking medicines as directed and avoiding allergens.  Contact your health care provider if you develop a persistent cough or fever.  During allergy season, keep windows closed as much as possible. This information is not intended to replace advice given to you by your health care provider. Make sure you discuss any questions you have with your health care provider. Document Revised: 11/08/2017 Document Reviewed: 01/03/2017 Elsevier Patient Education  2020 Elsevier Inc. Eustachian Tube Dysfunction  Eustachian tube  dysfunction refers to a condition in which a blockage develops in the narrow passage that connects the middle ear to the back of the nose (eustachian tube). The eustachian tube regulates air pressure in the middle ear by letting air move between the ear and nose. It also helps to drain fluid from the middle ear space. Eustachian tube dysfunction can affect one or both ears. When the eustachian tube does not function properly, air pressure, fluid, or both can build up in the middle ear. What are the causes? This condition occurs when the eustachian tube becomes blocked or cannot open normally. Common causes of this condition include:  Ear infections.  Colds and other infections that affect the nose, mouth, and throat (upper respiratory  tract).  Allergies.  Irritation from cigarette smoke.  Irritation from stomach acid coming up into the esophagus (gastroesophageal reflux). The esophagus is the tube that carries food from the mouth to the stomach.  Sudden changes in air pressure, such as from descending in an airplane or scuba diving.  Abnormal growths in the nose or throat, such as: ? Growths that line the nose (nasal polyps). ? Abnormal growth of cells (tumors). ? Enlarged tissue at the back of the throat (adenoids). What increases the risk? You are more likely to develop this condition if:  You smoke.  You are overweight.  You are a child who has: ? Certain birth defects of the mouth, such as cleft palate. ? Large tonsils or adenoids. What are the signs or symptoms? Common symptoms of this condition include:  A feeling of fullness in the ear.  Ear pain.  Clicking or popping noises in the ear.  Ringing in the ear.  Hearing loss.  Loss of balance.  Dizziness. Symptoms may get worse when the air pressure around you changes, such as when you travel to an area of high elevation, fly on an airplane, or go scuba diving. How is this diagnosed? This condition may be diagnosed based on:  Your symptoms.  A physical exam of your ears, nose, and throat.  Tests, such as those that measure: ? The movement of your eardrum (tympanogram). ? Your hearing (audiometry). How is this treated? Treatment depends on the cause and severity of your condition.  In mild cases, you may relieve your symptoms by moving air into your ears. This is called "popping the ears."  In more severe cases, or if you have symptoms of fluid in your ears, treatment may include: ? Medicines to relieve congestion (decongestants). ? Medicines that treat allergies (antihistamines). ? Nasal sprays or ear drops that contain medicines that reduce swelling (steroids). ? A procedure to drain the fluid in your eardrum (myringotomy). In this  procedure, a small tube is placed in the eardrum to:  Drain the fluid.  Restore the air in the middle ear space. ? A procedure to insert a balloon device through the nose to inflate the opening of the eustachian tube (balloon dilation). Follow these instructions at home: Lifestyle  Do not do any of the following until your health care provider approves: ? Travel to high altitudes. ? Fly in airplanes. ? Work in a P[employer] scheme manager or room. ? Scuba dive.  Do not use any products that contain nicotine or tobacco, such as cigarettes and e-cigarettes. If you need help quitting, ask your health care provider.  Keep your ears dry. Wear fitted earplugs during showering and bathing. Dry your ears completely after. General instructions  Take over-the-counter and prescription medicines only as  told by your health care provider.  Use techniques to help pop your ears as recommended by your health care provider. These may include: ? Chewing gum. ? Yawning. ? Frequent, forceful swallowing. ? Closing your mouth, holding your nose closed, and gently blowing as if you are trying to blow air out of your nose.  Keep all follow-up visits as told by your health care provider. This is important. Contact a health care provider if:  Your symptoms do not go away after treatment.  Your symptoms come back after treatment.  You are unable to pop your ears.  You have: ? A fever. ? Pain in your ear. ? Pain in your head or neck. ? Fluid draining from your ear.  Your hearing suddenly changes.  You become very dizzy.  You lose your balance. Summary  Eustachian tube dysfunction refers to a condition in which a blockage develops in the eustachian tube.  It can be caused by ear infections, allergies, inhaled irritants, or abnormal growths in the nose or throat.  Symptoms include ear pain, hearing loss, or ringing in the ears.  Mild cases are treated with maneuvers to unblock the ears, such as  yawning or ear popping.  Severe cases are treated with medicines. Surgery may also be done (rare). This information is not intended to replace advice given to you by your health care provider. Make sure you discuss any questions you have with your health care provider. Document Revised: 03/18/2018 Document Reviewed: 03/18/2018 Elsevier Patient Education  2020 [employer]. How to Perform a Sinus Rinse A sinus rinse is a home treatment that is used to rinse your sinuses with a sterile mixture of salt and water (saline solution). Sinuses are air-filled spaces in your skull behind the bones of your face and forehead that open into your nasal cavity. A sinus rinse can help to clear mucus, dirt, dust, or pollen from your nasal cavity. You may do a sinus rinse when you have a cold, a virus, nasal allergy symptoms, a sinus infection, or stuffiness in your nose or sinuses. Talk with your health care provider about whether a sinus rinse might help you. What are the risks? A sinus rinse is generally safe and effective. However, there are a few risks, which include:  A burning sensation in your sinuses. This may happen if you do not make the saline solution as directed. Be sure to follow all directions when making the saline solution.  Nasal irritation.  Infection from contaminated water. This is rare, but possible. Do not do a sinus rinse if you have had ear or nasal surgery, ear infection, or blocked ears. Supplies needed:  Saline solution or powder.  Distilled or sterile water may be needed to mix with saline powder. ? You may use boiled and cooled tap water. Boil tap water for 5 minutes; cool until it is lukewarm. Use within 24 hours. ? Do not use regular tap water to mix with the saline solution.  Neti pot or nasal rinse bottle. These supplies release the saline solution into your nose and through your sinuses. Neti pots and nasal rinse bottles can be purchased at [employer], a health  food store, or online. How to perform a sinus rinse  12. Wash your hands with soap and water. 13. Wash your device according to the directions that came with the product and then dry it. 14. Use the solution that comes with your product or one that is sold separately in stores. Follow the  mixing directions on the package if you need to mix with sterile or distilled water. 15. Fill the device with the amount of saline solution noted in the device instructions. 16. Stand over a sink and tilt your head sideways over the sink. 17. Place the spout of the device in your upper nostril (the one closer to the ceiling). 18. Gently pour or squeeze the saline solution into your nasal cavity. The liquid should drain out from the lower nostril if you are not too congested. 19. While rinsing, breathe through your open mouth. 20. Gently blow your nose to clear any mucus and rinse solution. Blowing too hard may cause ear pain. 21. Repeat in your other nostril. 22. Clean and rinse your device with clean water and then air-dry it. Talk with your health care provider or pharmacist if you have questions about how to do a sinus rinse. Summary  A sinus rinse is a home treatment that is used to rinse your sinuses with a sterile mixture of salt and water (saline solution).  A sinus rinse is generally safe and effective. Follow all instructions carefully.  Before doing a sinus rinse, talk with your health care provider about whether it would be helpful for you. This information is not intended to replace advice given to you by your health care provider. Make sure you discuss any questions you have with your health care provider. Document Revised: 09/23/2017 Document Reviewed: 09/23/2017 Elsevier Patient Education  2020 Else[employer] Inc. Foot Pain Many things can cause foot pain. Some common causes are:  An injury.  A sprain.  Arthritis.  Blisters.  Bunions. Follow these instructions at home: Managing pain,  stiffness, and swelling If directed, put ice on the painful area:  Put ice in a plastic bag.  Place a towel between your skin and the bag.  Leave the ice on for 20 minutes, 2-3 times a day.  Activity  Do not stand or walk for long periods.  Return to your normal activities as told by your health care provider. Ask your health care provider what activities are safe for you.  Do stretches to relieve foot pain and stiffness as told by your health care provider.  Do not lift anything that is heavier than 10 lb (4.5 kg), or the limit that you are told, until your health care provider says that it is safe. Lifting a lot of weight can put added pressure on your feet. Lifestyle  Wear comfortable, supportive shoes that fit you well. Do not wear high heels.  Keep your feet clean and dry. General instructions  Take over-the-counter and prescription medicines only as told by your health care provider.  Rub your foot gently.  Pay attention to any changes in your symptoms.  Keep all follow-up visits as told by your health care provider. This is important. Contact a health care provider if:  Your pain does not get better after a few days of self-care.  Your pain gets worse.  You cannot stand on your foot. Get help right away if:  Your foot is numb or tingling.  Your foot or toes are swollen.  Your foot or toes turn white or blue.  You have warmth and redness along your foot. Summary  Common causes of foot pain are injury, sprain, arthritis, blisters or bunions.  Ice, medicines, and comfortable shoes may help foot pain.  Contact your health care provider if your pain does not get better after a few days of self-care. This information is not  intended to replace advice given to you by your health care provider. Make sure you discuss any questions you have with your health care provider. Document Revised: 09/11/2018 Document Reviewed: 09/11/2018 Elsevier Patient Education  2020  [employer].

## 2020-08-11 NOTE — Progress Notes (Signed)
Subjective:    Patient ID: Ryan Glass, male    DOB: 1956/07/12, 64 y.o.   MRN: [id number]  64y/o [place] male established patient here for re-evaluation ear pain/tinnitus and recommendation if he is to repeat his covid booster since he received it a couple days after monoclonal antibody infusion and if he has to delay his flu vaccination for same reason. Positive covid test 07/11/2020 symptomatic home sp02 less than 90% went to [facility] ER for evaluation and scheduled for Monoclonal antibodies received on 15 Jul 2020.  Completed an azithromycin course 8/29 given to him at urgent care for pneumonia/bronchitis.  Patient also had hydrocelectomy in July 2021 and wound infection care provider with [facility].  Patient reported surgical site doing much better now.  Patient needs another bottle of nasal saline, taking his singulair and flonase every day.  Had diphenhydramine with monoclonal infusion and denied urinary retention or dysuria.  Just returned to work this week, stamina still a little less than usual.  Left foot pain also wondering if lump can get shaved off.  Has follow up with rheumatology this month.  Typically gout pain ankle not 5th toe joint/foot that is bothering him now.  He stated lump started a couple years ago.  Denied hot/red to touch but leather shoes pressing on area bothering him along with if he sleeps on his side and pressure over affected area into mattress keeps him from falling asleep.  Denied trauma.  Buying wide shoes to try and accomodate width of foot but not helping any more.  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  PMHx seasonal allergies, gout, rheumatoid arthritis     Review of Systems  Constitutional: Positive for fatigue. Negative for activity change, appetite change, chills, diaphoresis and fever.  HENT: Positive for postnasal drip. Negative for trouble swallowing and voice change.   Eyes: Negative for photophobia and visual disturbance.   Respiratory: Negative for cough, shortness of breath, wheezing and stridor.   Cardiovascular: Negative for chest pain and leg swelling.  Gastrointestinal: Negative for diarrhea, nausea and vomiting.  Endocrine: Negative for cold intolerance and heat intolerance.  Genitourinary: Negative for difficulty urinating.  Musculoskeletal: Positive for arthralgias. Negative for back pain, gait problem, neck pain and neck stiffness.  Skin: Negative for rash.  Allergic/Immunologic: Positive for environmental allergies and immunocompromised state. Negative for food allergies.  Neurological: Negative for dizziness, tremors, seizures, syncope, speech difficulty, weakness, light-headedness, numbness and headaches.  Hematological: Negative for adenopathy. Does not bruise/bleed easily.  Psychiatric/Behavioral: Positive for sleep disturbance. Negative for agitation and confusion.       Objective:   Physical Exam Vitals and nursing note reviewed.  Constitutional:      General: He is awake. He is not in acute distress.    Appearance: Normal appearance. He is well-developed and well-groomed. He is obese. He is not ill-appearing, toxic-appearing or diaphoretic.  HENT:     Head: Normocephalic and atraumatic.     Jaw: There is normal jaw occlusion.     Salivary Glands: Right salivary gland is not diffusely enlarged or tender. Left salivary gland is not diffusely enlarged or tender.     Right Ear: Hearing, ear canal and external ear normal. A middle ear effusion is present. There is no impacted cerumen.     Left Ear: Hearing, ear canal and external ear normal. A middle ear effusion is present. There is no impacted cerumen.     Nose: Nose normal. No congestion.  Right Turbinates: Not enlarged, swollen or pale.     Left Turbinates: Not enlarged, swollen or pale.     Right Sinus: No maxillary sinus tenderness or frontal sinus tenderness.     Left Sinus: No maxillary sinus tenderness or frontal sinus  tenderness.     Mouth/Throat:     Lips: Pink. No lesions.     Mouth: Mucous membranes are moist. No angioedema.     Dentition: No gingival swelling, dental abscesses or gum lesions.     Tongue: No lesions. Tongue does not deviate from midline.     Palate: No mass and lesions.     Pharynx: Uvula midline. Pharyngeal swelling and posterior oropharyngeal erythema present. No oropharyngeal exudate or uvula swelling.     Tonsils: No tonsillar exudate.     Comments: Cobblestoning posterior pharynx; bilateral TMs air fluid level; lower eyelids nonpitting edema 1-2/4 bilaterally; bilateral allergic shiners; nasal turbinates with clear discharge Eyes:     General: Vision grossly intact. Gaze aligned appropriately. Allergic shiner present. No visual field deficit or scleral icterus.       Right eye: No discharge.        Left eye: No discharge.     Extraocular Movements: Extraocular movements intact.     Right eye: Normal extraocular motion and no nystagmus.     Left eye: Normal extraocular motion and no nystagmus.     Conjunctiva/sclera: Conjunctivae normal.     Right eye: Right conjunctiva is not injected. No chemosis.    Left eye: Left conjunctiva is not injected. No chemosis.    Pupils: Pupils are equal, round, and reactive to light.  Neck:     Trachea: Trachea and phonation normal.  Cardiovascular:     Rate and Rhythm: Normal rate and regular rhythm.     Pulses: Normal pulses.          Radial pulses are 2+ on the right side and 2+ on the left side.  Pulmonary:     Effort: Pulmonary effort is normal. No respiratory distress.     Breath sounds: Normal breath sounds and air entry. No stridor, decreased air movement or transmitted upper airway sounds. No wheezing or rhonchi.     Comments: Wearing disposable surgical mask due to covid 19 pandemic; spoke full sentences without difficulty; no cough observed in clinic Abdominal:     Palpations: Abdomen is soft.  Musculoskeletal:        General:  Tenderness and deformity present. No swelling or signs of injury.     Right shoulder: Normal.     Left shoulder: Normal.     Right elbow: Normal.     Left elbow: Normal.     Right hand: Deformity present. No swelling, lacerations, tenderness or bony tenderness. Decreased range of motion. Normal strength. Normal capillary refill. Normal pulse.     Left hand: Deformity present. No swelling, lacerations, tenderness or bony tenderness. Decreased range of motion. Normal strength. Normal capillary refill. Normal pulse.     Cervical back: Normal range of motion and neck supple. No swelling, edema, deformity, erythema, signs of trauma, lacerations, rigidity, tenderness or crepitus. Normal range of motion.     Thoracic back: No swelling, edema, signs of trauma, lacerations or tenderness.     Lumbar back: No swelling, edema, deformity, signs of trauma or lacerations.     Right hip: No deformity, lacerations or crepitus. Normal strength.     Left hip: No deformity, lacerations or crepitus. Normal strength.  Right lower leg: No edema.     Left lower leg: No edema.     Left foot: Normal capillary refill. Deformity, bunion, tenderness and bony tenderness present. No swelling, Charcot foot, foot drop, laceration or crepitus. Normal pulse.       Legs:     Comments: Bilateral hands enlarged MCP joints; MTP joints 1&5 left enlarged and 5th TTP mildly especially laterally no erythema/increased temperature/crepitus/edema/fluctuance 5th MTP or loose body; firm nodule 3cm  Feet:     Left foot:     Skin integrity: Skin integrity normal. No ulcer, blister, skin breakdown, erythema, warmth, callus, dry skin or fissure.  Lymphadenopathy:     Head:     Right side of head: No submental, submandibular, tonsillar, preauricular, posterior auricular or occipital adenopathy.     Left side of head: No submental, submandibular, tonsillar, preauricular, posterior auricular or occipital adenopathy.     Cervical: No cervical  adenopathy.     Right cervical: No superficial cervical adenopathy.    Left cervical: No superficial cervical adenopathy.  Skin:    General: Skin is warm and dry.     Capillary Refill: Capillary refill takes less than 2 seconds.     Coloration: Skin is not ashen, cyanotic, jaundiced, mottled, pale or sallow.     Findings: No abrasion, abscess, acne, bruising, burn, ecchymosis, erythema, signs of injury, laceration, lesion, petechiae, rash or wound.     Nails: There is no clubbing.     Comments: Removed cotton sock left foot for physical exam and leather boat sperry shoes  Neurological:     General: No focal deficit present.     Mental Status: He is alert and oriented to person, place, and time. Mental status is at baseline.     GCS: GCS eye subscore is 4. GCS verbal subscore is 5. GCS motor subscore is 6.     Cranial Nerves: No cranial nerve deficit, dysarthria or facial asymmetry.     Sensory: No sensory deficit.     Motor: No weakness, tremor, atrophy, abnormal muscle tone or seizure activity.     Coordination: Coordination is intact. Coordination normal.     Gait: Gait is intact. Gait normal.     Comments: Gait sure and steady in clinic; on/off exam table without difficulty; bilateral hand grasp equal 5/5  Psychiatric:        Attention and Perception: Attention and perception normal.        Mood and Affect: Mood and affect normal.        Speech: Speech normal.        Behavior: Behavior normal. Behavior is cooperative.        Thought Content: Thought content normal.        Cognition and Memory: Cognition and memory normal.        Judgment: Judgment normal.           Assessment & Plan:  A-seasonal allergic rhinitis, eustachian tube dysfunction, left foot pain  P-Patient may use normal saline nasal spray 2 sprays each nostril q2h wa as needed given 1 bottle from clinic stock, continue flonase 1 spray each nostril BID discussed administration saline first sniff until he  feels draining down throat or out other nostril then wait 10 minutes and blow nose then light inhale flonase shouldn't feel in back of throat flonase. Continue singulair 10mg  po qhs. Patient denied personal or family history of ENT cancer.  OTC antihistamine of choice claritin 10mg  po daily #90 RF3 stop  if urinary retention.  Avoid triggers if possible.  Shower prior to bedtime if exposed to triggers.  If allergic dust/dust mites recommend mattress/pillow covers/encasements; washing linens, vacuuming, sweeping, dusting weekly.  Call or return to clinic as needed if these symptoms worsen or fail to improve as anticipated. Recommended to wait 2 weeks after regeneron before flu vaccination per Tamsen Snider  monoclonal antibody clinic staff; recommended to wait 90 days for covid vaccination--repeat booster 90 days after monoclonal infusion 16 Oct 2020  Reviewed CDC/ACIP/Up to Date and no further guidance could be found at this time if shingles/pneumonia/other vaccinates except Tdap.  Tdap to be given if greater than 5 years and new laceration/puncture wound  Discussed with patient guidance changes daily and to ask when he sees PCM/rheumatology again since he is on prednisone and Embrel.  Case discussed with Dr Sullivan Lone and he had no further guidance at this time regarding timing of vaccinations other than above.  Exitcare handout on allergic rhinitis and sinus rinse printed and given to patient.  Patient verbalized understanding of instructions, agreed with plan of care and had no further questions at this time.  P2:  Avoidance and hand washing.   No evidence of invasive bacterial infection, non toxic and well hydrated.  I do not see where any further testing or imaging is necessary at this time.   I will suggest supportive care, rest, good hygiene and encourage the patient to take adequate fluids.  The patient is to return to clinic or EMERGENCY ROOM if symptoms worsen or change significantly e.g. ear pain, fever,  purulent discharge from ears or bleeding.  Exitcare handout on otitis media and eustachian tube dysfunction printed and given to patient.  Discussed with patient post nasal drip irritates throat/causes swelling blocks eustachian tubes from draining and fluid fills up middle ear.  Bacteria/viruses can grow in fluid and with moving head tube compressed and increases pressure in tube/ear worsening pain.  Studies show will take 30 days for fluid to resolve after post nasal drip controlled with nasal steroid/antihistamine. Antibiotics and steroids do not speed up fluid removal.  Patient verbalized agreement and understanding of treatment plan and had no further questions at this time.  Foot pain discussed I think related to rheumatoid arthritis or gout joint damage/tophi.  Consider podiatry/xray.  Discuss with rheumatologist at his follow up visit.  Prednisone not helping his pain.  Discussed pillow between and under left calf to keep foot off mattress for comfort while sleeping.  Consider canvas or sneaker type fabric shoes that have more give and will not push on affected area as firmly as his current thick leather shoes. Patient also has diclofenac and gabapentin for pain at home. Tylenol 1000mg  po QID prn pain or trial biofreeze topical QID prn pain/cryotherapy 15 minutes QID prn pain and elevate feet/remove shoes when at home/on break prn pain.  Exitcare handout on foot pain printed and given to patient.  Patient verbalized understanding information/instructions, agreed with plan of care and had no further questions at this time.

## 2020-08-23 ENCOUNTER — Telehealth: Payer: Self-pay | Admitting: Registered Nurse

## 2020-08-23 DIAGNOSIS — J301 Allergic rhinitis due to pollen: Secondary | ICD-10-CM

## 2020-08-23 DIAGNOSIS — M79672 Pain in left foot: Secondary | ICD-10-CM

## 2020-08-23 DIAGNOSIS — H6983 Other specified disorders of Eustachian tube, bilateral: Secondary | ICD-10-CM

## 2020-08-23 DIAGNOSIS — G8929 Other chronic pain: Secondary | ICD-10-CM

## 2020-08-23 NOTE — Telephone Encounter (Signed)
Discussed patient recent lab results and xray results with patient at his desk.  He reported he has been sleeping in hammock and reported he wakes up with less back pain then if he sleeps on floor or regular mattress.  Discussed consider podiatry referral for his foot pain if new shoes (new balance have worked well for him in the past current pair "old" per patient/worn out.  Also discussed [employer] on [address] does foot/gait analysis and would advise patient for free what shoes recommended for his foot type no obligation to buy shoes from them to obtain analysis/recommendation.  Patient verbalized understanding when discussed DDD/DJD lumbar spondylosis/anteriolithesis/scoliosis.  GFR  decreased from Feb 2021 patient reported he had not drank much water prior to lab draw and had covid illness previous month.  Patient sp02 at desk 96/97% RA speaking with me.  RR 16  HR 65 examined bilateral canals clear bilateral TMs air fluid level clear no erythema  Patient verbalized understanding information/instructions, agreed with plan of care and will follow up with him later this week when I receive clarification on his covid vaccination from ID.

## 2020-08-23 NOTE — Telephone Encounter (Signed)
Stopped at patient desk for update on his ear symptoms.  Patient denied drainage/fever/chills/worsening pain.  Denied sinus problems/post nasal drip.  Tried rubbing alcohol in ear canal didn't help.  He has questions on xray results foot and back done at rheumatology and also regarding if he needs to repeat his covid 3rd dose vaccination for immunocompromised since monoclonal antibody infusion completed a few days prior to vaccination dose.  DIscussed with patient I would go back to my desk and review his xray reports and call him at [phone] with information.  No answer when I called patient back so left message to follow up Thursday when I return to clinic.  I also discussed previously with patient I needed to follow up with Infectious Disease but I think he will require a repeat third covid dose due to monoclonal antibodies. Need to verify since he also had covid in August timing of next dose but at least 90 days after monoclonal infusion. Flu shot may be given when available.  Specialist reported flu vaccination recommended at least two weeks after monoclonal antibodies.  Patient reported he also had labs done and kidneys okay from what he saw.  CLINICAL DATA: Low back pain   EXAM:  LUMBAR SPINE - 2-3 VIEW   COMPARISON: None.   FINDINGS:  5 nonrib bearing lumbar-type vertebralbodies.   Vertebral body heights are maintained. No acute fracture.  Generalized osteopenia. Severe levoscoliosis of the lumbar spine  centered at L2.   Grade 1 anterolisthesis of L4 on L5 and L5 on S1. No spondylolysis.   Severe degenerative diseasewith disc height loss at L2-3.  Degenerative disease with disc height loss at T12-L1, L1-2, L4-5 and  L5-S1. Bilateral facet arthropathy throughout the lumbar spine most  severe at L4-5 and L5-S1.   SI joints are unremarkable.   Abdominal aortic atherosclerosis.   IMPRESSION:  1. Lumbar spine spondylosis as described above.  2. Severe levoscoliosis of the lumbar  spine centered at L2.    Electronically Signed   By: Elige Ko   On: 08/22/2020 08:16  CLINICAL DATA: Left foot pain. History of rheumatoid arthritis.   EXAM:  LEFT FOOT - COMPLETE 3+ VIEW   COMPARISON: Left foot x-rays dated September 29, 2015.   FINDINGS:  No acute fracture or dislocation. Chronic erosive changes of the  first and fifth MTP joints with surrounding soft tissue swelling. No  progressive joint space narrowing. Unchanged second through fourth  TMT joint space narrowing with subchondral cysts versus erosions.  Bone mineralization is normal.   IMPRESSION:  1. Chronic erosive changes of the first and fifth MTP joints with  surrounding soft tissue swelling, consistent with rheumatoid  arthritis. No progressive erosive changes or joint space narrowing.  2. Unchanged second through fourth TMT joint space narrowing with  subchondral cysts versus erosions.    Electronically Signed  By: Obie Dredge M.D.  On: 08/22/2020 08:19  Sodium 135 - 146 MMOL/L 140        Potassium 3.5 - 5.3 MMOL/L 3.4Low        Chloride 98 - 110 MMOL/L 106        CO2 23 - 30 MMOL/L 25        BUN 8 - 24 MG/DL 30ZSWF        Glucose 70 - 99 MG/DL 093ATFT  Patients taking eltrombopag at doses >/= 100 mg daily may show falsely elevated values of 10% or greater.      Creatinine 0.50 - 1.50 MG/DL 7.32  Calcium 8.5 - 10.5 MG/DL 9.2        Total Protein 6.0 - 8.3 G/DL 6.4  Patients taking eltrombopag at doses >/= 100 mg daily may show falsely elevated values of 10% or greater.      Albumin  3.5 - 5.0 G/DL 4.3        Total Bilirubin 0.1 - 1.2 MG/DL 0.6  Patients taking eltrombopag at doses >/= 100 mg daily may show falsely elevated values of 10% or greater.      Alkaline Phosphatase 25 - 125 IU/L or U/L 68        AST (SGOT) 5 - 40 IU/L or U/L 21        ALT (SGPT) 5 - 50 IU/L or U/L 23        Anion Gap 4 - 14 MMOL/L 8        Est. GFR Non-African American >=60  ML/MIN/1.73 M*2  57Low  GFR estimated by CKD-EPI equations, reportable up to 90 ML/MIN/1.73 M*2      Resulting Agency  [facility]   Specimen Collected: 08/19/20 3:49 PM Last Resulted: 08/19/20 5:23 PM  Received From: [facility]  Result Received: 08/23/20 12:24 PM   Specimen:  Blood  Ref Range & Units 4 d ago  Sed Rate 0 - 15 MM/HR 7      Resulting Agency  [facility]  Specimen Collected: 08/19/20 3:49 PM Last Resulted: 08/19/20 4:32 PM  Received From: [facility]  Result Received: 08/23/20 12:24 PM    Specimen:  Blood  Ref Range & Units 4 d ago  CRP, NON-CARDIAC <5.0 MG/L <5.0      Resulting Agency  [facility]  Specimen Collected: 08/19/20 3:49 PM Last Resulted: 08/19/20 5:23 PM  Received From: [facility]  Result Received: 08/23/20 12:24 PM    Specimen:  Blood  Ref Range & Units 4 d ago  Uric Acid 2.5 - 8.0 MG/DL 7.2      Resulting Agency  [facility]  Specimen Collected: 08/19/20 3:49 PM Last Resulted: 08/19/20 5:23 PM  Received From: [facility]  Result Received: 08/23/20 12:24 PM     Related to CBC and Differential Component 08/19/20 05/12/20 04/24/19 01/20/18 10/29/16 09/29/15  WBC 6.2 7.1 4.9 5.8 -- 5.4   RBC 3.90Low 4.00Low 4.57 4.93 4.85 4.83  Hemoglobin 13.0Low 13.4Low 14.6 15.8 15.1 14.7  Hematocrit 37.7Low 38.7Low 41.3 45.1 44.1 41.6  MCV 96.7High 96.9High 90.4 91.5 90.9 86.1  MCH 33.3High 33.5High 31.9 32.1 31.1 30.4  MCHC 34.4 34.5 35.3 35.1 34.2 35.3  RDW 14.7 15.0 13.5 13.2 13.3 13.9  Platelets 187 184 166 204 194 198  MPV 7.6 8.8 8.7 9.4 10.9 10.6  Neutrophil % 77 78 72 64 -- 55.2  Lymphocyte % 15 14 18 28  28.2 31.6  Monocyte % 6 6 7 6  6.6 9.9  Eosinophil % 0 1 2 1  0.6 2.4   Basophil % 1 1 1 1  0.6 0.7  Neutrophil Absolute 4.8 5.5 3.5 3.7 -- 3.0  Lymphocyte Absolute 0.9Low 1.0 0.9 1.6 1.5 1.7  Monocyte Absolute 0.4 0.4 0.3 0.4 0.4 0.5  Eosinophil Absolute 0.0 0.1 0.1 0.1 0.0 0.1  Basophil Absolute 0.1 0.1 0.0 0.1 0.0 0.0

## 2020-08-26 ENCOUNTER — Telehealth: Payer: Self-pay | Admitting: Registered Nurse

## 2020-08-26 ENCOUNTER — Encounter: Payer: Self-pay | Admitting: Registered Nurse

## 2020-08-26 DIAGNOSIS — E876 Hypokalemia: Secondary | ICD-10-CM

## 2020-08-26 NOTE — Telephone Encounter (Signed)
Follow up from 08/23/2020 noted on labs patient with low potassium level and he is on supplements.  Called patient today to discuss with him most recent blood level and to see if dietary changes or missing doses.  Patient reported he has been forgetting to take his lunch time potassium sometimes but is taking 2 tabs am/pm at home.  Patient denied any worsening of palpitations or new symptoms/cramps/fatigue worsening.  He stated he has been feeling well.  He stated he will try to do better with lunch time dose.  Discussed with patient if he forgets that dose at work to take as soon as he remembers or take 2 the next day at lunch if missed day prior.  Patient verbalized understanding information/instructions and had no further questions at this time.  Patient also reported he had first physical therapy appt and therapist gave him back strengthening exercises and that appt went well and he plans to go to therapy twice a week

## 2020-10-06 ENCOUNTER — Ambulatory Visit: Payer: Self-pay | Admitting: Registered Nurse

## 2020-10-06 ENCOUNTER — Encounter: Payer: Self-pay | Admitting: Registered Nurse

## 2020-10-06 ENCOUNTER — Other Ambulatory Visit: Payer: Self-pay

## 2020-10-06 VITALS — BP 149/85 | HR 75 | Temp 97.5°F

## 2020-10-06 DIAGNOSIS — E876 Hypokalemia: Secondary | ICD-10-CM

## 2020-10-06 DIAGNOSIS — J209 Acute bronchitis, unspecified: Secondary | ICD-10-CM

## 2020-10-06 DIAGNOSIS — J019 Acute sinusitis, unspecified: Secondary | ICD-10-CM

## 2020-10-06 MED ORDER — ALBUTEROL SULFATE 108 (90 BASE) MCG/ACT IN AEPB: 1.0000 | INHALATION_SPRAY | RESPIRATORY_TRACT | 0 refills | Status: DC | PRN

## 2020-10-06 NOTE — Progress Notes (Signed)
Subjective:    Patient ID: Ryan Glass, male    DOB: 06-Aug-1956, 64 y.o.   MRN: [id number]  64y/o Hispanic established male pt reporting shortness of breath with activity since he had Covid 07/13/2020, ER visit and monoclonal antibody infusion. Bronchopneumonia 08/03/2020 treated [facility].  Reports endurance is decreased. Feels very little activity now can make him short of breath and he has to sit and take breaks much more often than he did pre-Covid. At rest O2 sat 91-94% at home.  He has noticed with activity closer to 90%.  He used his albuterol inhaler until he ran out post hospitalization.  Leg swelling his usual no worse.  Has been taking an extra potassium pill as instructed because potassium was low last two blood draws.  Some post nasal drip/congestion with seasonal allergy fall flare.  Would like another bottle of nasal saline  Has been using nasal saline, flonase, singulair.  Denied fever chills/n/v/d/productive cough/teeth hurting or known covid contacts.  He is on methotrexate/embrel and prednisone for psoriatic arthritis.  Finished physical therapy for his back pain/scoliosis last week of September per patient.  Hasn't been doing home exercises as much as when he was in therapy three times per week.  Has been doing hand weights but needs to buy exercise ball and therapy bands for home use still.  Patient reported moving furniture with spouse this weekend and got short of breath/dizzy resolved with rest.   Patient reported had nephrology appt recently also.  Cardiology follow up pending November.     Review of Systems  Constitutional: Positive for fatigue. Negative for activity change, appetite change, chills and diaphoresis.  HENT: Positive for congestion, postnasal drip and sinus pressure. Negative for nosebleeds, sinus pain, sneezing, sore throat, tinnitus and trouble swallowing.   Eyes: Negative for photophobia, pain, discharge, redness, itching and visual disturbance.   Respiratory: Positive for shortness of breath and wheezing. Negative for cough, choking, chest tightness and stridor.   Cardiovascular: Positive for leg swelling. Negative for chest pain.  Gastrointestinal: Negative for abdominal pain, diarrhea, nausea and vomiting.  Endocrine: Negative for cold intolerance and heat intolerance.  Genitourinary: Negative for difficulty urinating.  Musculoskeletal: Positive for back pain. Negative for gait problem, myalgias, neck pain and neck stiffness.  Skin: Negative for color change, pallor, rash and wound.  Allergic/Immunologic: Positive for environmental allergies and immunocompromised state. Negative for food allergies.  Neurological: Positive for dizziness and weakness. Negative for tremors, seizures, syncope, facial asymmetry, speech difficulty, light-headedness, numbness and headaches.  Hematological: Negative for adenopathy. Does not bruise/bleed easily.  Psychiatric/Behavioral: Negative for agitation, confusion and sleep disturbance.       Objective:   Physical Exam Vitals and nursing note reviewed.  Constitutional:      General: He is awake. He is not in acute distress.    Appearance: Normal appearance. He is well-developed and well-groomed. He is obese. He is ill-appearing. He is not toxic-appearing or diaphoretic.     Interventions: He is not intubated. HENT:     Head: Normocephalic and atraumatic.     Jaw: There is normal jaw occlusion.     Salivary Glands: Right salivary gland is not diffusely enlarged or tender. Left salivary gland is not diffusely enlarged or tender.     Right Ear: Hearing, ear canal and external ear normal. A middle ear effusion is present. There is no impacted cerumen.     Left Ear: Hearing, ear canal and external ear normal. A middle ear effusion  is present. There is no impacted cerumen.     Nose: Rhinorrhea present. Rhinorrhea is clear.     Right Turbinates: Not enlarged, swollen or pale.     Left Turbinates: Not  enlarged, swollen or pale.     Right Sinus: No maxillary sinus tenderness or frontal sinus tenderness.     Left Sinus: No maxillary sinus tenderness or frontal sinus tenderness.     Mouth/Throat:     Lips: Pink. No lesions.     Mouth: Mucous membranes are moist. No lacerations, oral lesions or angioedema.     Dentition: No gingival swelling, dental abscesses or gum lesions.     Tongue: No lesions. Tongue does not deviate from midline.     Palate: No mass and lesions.     Pharynx: Uvula midline. Pharyngeal swelling and posterior oropharyngeal erythema present. No oropharyngeal exudate or uvula swelling.     Tonsils: No tonsillar exudate.     Comments: Cobblestoning posterior pharynx; bilateral TMs air fluid level clear; bilateral lower eyelid swelling 1-2+/4 nonpitting; clear discharge bilateral nasal turbinates; bilateral allergic shiners Eyes:     General: Vision grossly intact. Gaze aligned appropriately. Allergic shiner present. No visual field deficit or scleral icterus.       Right eye: No discharge.        Left eye: No discharge.     Extraocular Movements: Extraocular movements intact.     Conjunctiva/sclera: Conjunctivae normal.     Pupils: Pupils are equal, round, and reactive to light.  Neck:     Thyroid: No thyromegaly.     Vascular: No hepatojugular reflux or JVD.     Trachea: Trachea and phonation normal. No tracheal tenderness, abnormal tracheal secretions or tracheal deviation.  Cardiovascular:     Rate and Rhythm: Normal rate and regular rhythm.  No extrasystoles are present.    Chest Wall: PMI is not displaced.     Pulses: Normal pulses. No decreased pulses.          Radial pulses are 2+ on the right side and 2+ on the left side.     Heart sounds: Normal heart sounds, S1 normal and S2 normal. No murmur heard.  No friction rub. No gallop.      Comments: Sock line ankle socks usual mildly pitting 1+ bilaterally lower legs Pulmonary:     Effort: No tachypnea, bradypnea,  accessory muscle usage or respiratory distress. He is not intubated.     Breath sounds: Normal air entry. No stridor or transmitted upper airway sounds. Examination of the right-lower field reveals decreased breath sounds. Examination of the left-lower field reveals decreased breath sounds. Decreased breath sounds present. No wheezing, rhonchi or rales.     Comments: Patient can speak full sentences at rest without difficulty but with ambulation speaks grouping of words between breaths instead of full sentences; RN Rolly Salter did ambulatory sp02 in warehouse with patient and 89-90% RA increased to 92% at rest sitting again; wearing disposable surgical mask due to covid 19 pandemic; no cough observed in clinic Abdominal:     Palpations: Abdomen is soft.  Musculoskeletal:        General: No tenderness or signs of injury.     Right shoulder: No swelling, deformity, laceration or crepitus.     Left shoulder: No swelling, deformity, laceration or crepitus.     Right elbow: No swelling, deformity or lacerations. Normal range of motion.     Left elbow: No swelling, deformity or lacerations. Normal range of motion.  Right hand: Deformity present. No swelling or lacerations. Decreased range of motion.     Left hand: Deformity present. No swelling or lacerations. Decreased range of motion.     Cervical back: Normal range of motion and neck supple. No edema, erythema, signs of trauma, rigidity, tenderness or crepitus. Normal range of motion.     Right lower leg: No tenderness. 1+ Pitting Edema present.     Left lower leg: No tenderness. 1+ Pitting Edema present.     Comments: Hands at baseline  Lymphadenopathy:     Head:     Right side of head: No submental, submandibular, tonsillar, preauricular, posterior auricular or occipital adenopathy.     Left side of head: No submental, submandibular, tonsillar, preauricular, posterior auricular or occipital adenopathy.     Cervical: No cervical adenopathy.      Right cervical: No superficial, deep or posterior cervical adenopathy.    Left cervical: No superficial, deep or posterior cervical adenopathy.  Skin:    General: Skin is warm and dry.     Capillary Refill: Capillary refill takes less than 2 seconds.     Coloration: Skin is not ashen, cyanotic, jaundiced, mottled, pale or sallow.     Findings: No abrasion, abscess, acne, bruising, burn, ecchymosis, erythema, signs of injury, laceration, lesion, petechiae, rash or wound.     Nails: There is no clubbing.  Neurological:     General: No focal deficit present.     Mental Status: He is alert and oriented to person, place, and time. Mental status is at baseline.     GCS: GCS eye subscore is 4. GCS verbal subscore is 5. GCS motor subscore is 6.     Cranial Nerves: Cranial nerves are intact. No cranial nerve deficit, dysarthria or facial asymmetry.     Sensory: Sensation is intact. No sensory deficit.     Motor: Motor function is intact. No weakness, tremor, atrophy, abnormal muscle tone or seizure activity.     Coordination: Coordination is intact. Coordination normal.     Gait: Gait is intact. Gait normal.     Comments: In/out of chair without difficulty and on/off exam table; bilateral hand grasp equal 5/5; gait sure and steady in clinic and hallway  Psychiatric:        Attention and Perception: Attention and perception normal.        Mood and Affect: Mood and affect normal. Mood is not anxious.        Speech: Speech normal.        Behavior: Behavior normal. Behavior is not agitated. Behavior is cooperative.        Thought Content: Thought content normal.        Cognition and Memory: Cognition and memory normal.        Judgment: Judgment normal.      Sodium 135 - 146 MMOL/L 140        Potassium 3.5 - 5.3 MMOL/L 3.4Low        Chloride 98 - 110 MMOL/L 106        CO2 23 - 30 MMOL/L 25        BUN 8 - 24 MG/DL 88PJSR        Glucose 70 - 99 MG/DL 159YVOP  Patients taking eltrombopag  at doses >/= 100 mg daily may show falsely elevated values of 10% or greater.      Creatinine 0.50 - 1.50 MG/DL 9.29        Calcium 8.5 - 10.5 MG/DL 9.2  Total Protein 6.0 - 8.3 G/DL 6.4  Patients taking eltrombopag at doses >/= 100 mg daily may show falsely elevated values of 10% or greater.      Albumin  3.5 - 5.0 G/DL 4.3        Total Bilirubin 0.1 - 1.2 MG/DL 0.6  Patients taking eltrombopag at doses >/= 100 mg daily may show falsely elevated values of 10% or greater.      Alkaline Phosphatase 25 - 125 IU/L or U/L 68        AST (SGOT) 5 - 40 IU/L or U/L 21        ALT (SGPT) 5 - 50 IU/L or U/L 23        Anion Gap 4 - 14 MMOL/L 8        Est. GFR Non-African American >=60 ML/MIN/1.73 M*2  57Low  GFR estimated by CKD-EPI equations, reportable up to 90 ML/MIN/1.73 M*2      Resulting Agency  [facility]   Specimen Collected: 08/19/20 3:49 PM Last Resulted: 08/19/20 5:23 PM  Received From: [facility]  Result Received: 08/23/20 12:24 PM          Assessment & Plan:  A-acute bronchitis, rhinosinusitis and hypokalemia  P-Will check BMET again today to ensure hypokalemia not worsening, pulse regular rate and rhythm today and patient has not noticed flare of palpitations his typical hypokalemia symptom.  Had decreased renal function/low calcium and potassium during covid infection.  Leg swelling at baseline not worse than usual.  Patient will experience some dyspnea with exertion especially when walking cross warehouse at faster than leisurely pace but today even with leisurely pace experiencing some shortness of breath that resolves with sitting 1-2 minutes in clinic.  dyspnea with exertion and sp02 RA lower than his baseline 96-98% office visits this year.  Breath sounds clear but decreased bases.  Patient reported sometimes notes wheezing at home and ran out of albuterol he was using post covid infection.  Electronic  Rx  Refilled albuterol MDI 1-2 puffs po q4-6h prn SOB/wheeze #1 RF0 to his pharmacy of choice [facility].  Start keflex 500mg  po BID x 10 days #30 RF0 dispensed from [facility] to patient.  Patient on prednisone 10mg  po daily for arthritis already and epocrates reports augmentin/doxycycline/azithromycin interacts possibly raising blood levels methotrexate so holding on use of those antibiotics at this time.  If no improvement with albuterol/keflex will send for chest xray and perform CBC.  Rx Albuterol possible side effects increased heart rate, hand tremors.  Patient has home sp02 monitor and BP machine and keeps log.  Encouraged him to continue daily check/log of both and notify clinic staff if decreasing more especially if below 90 and does not improve with rest/albuterol.  Must see provider same day if staying below 90 e.g. PCM/UC/ER.  Discussed I am not in clinic again until next week Tuesday 2 Nov but RN Monday in clinic tomorrow 10/29 and Monday 11/1.  Cardiology follow up pending November already scheduled with PA Dunn.  DDx long covid sequelae, pneumonia, CHF.  Patient has noticed more post nasal drip/congestion weather changed in the past week colder than we have had since May 2021 and windy/rainy/large atmospheric pressure changes this week/thunderstorms discussed could be worsening his symptoms also.   Bronchitis simple, community acquired, may have started as viral (probably respiratory syncytial, parainfluenza, influenza, or adenovirus), but now evidence of acute purulent bronchitis with resultant bronchial edema and mucus  formation.  Viruses are the most common cause of bronchial inflammation in otherwise healthy adults with acute bronchitis.  The appearance of sputum is not predictive of whether a bacterial infection is present.  Purulent sputum is most often caused by viral infections.  There are a small portion of those caused by non-viral agents being Mycoplama pneumonia.  Microscopic  examination or C&S of sputum in the healthy adult with acute bronchitis is generally not helpful (usually negative or normal respiratory flora) other considerations being cough from upper respiratory tract infections, sinusitis or allergic syndromes (mild asthma or viral pneumonia).  Differential Diagnoses:  reactive airway disease (asthma, allergic aspergillosis (eosinophilia), chronic bronchitis, respiratory infection (sinusitis, common cold, pneumonia), congestive heart failure, reflux esophagitis, bronchogenic tumor, aspiration syndromes and/or exposure to pulmonary irritants/smoke. Without high fever, severe dyspnea, lack of physical findings or other risk factors, I will hold on a chest radiograph and CBC at this time.   Exitcare handouts on bronchitis and inhaler use printed and given to patient.  ER if hemopthysis, SOB and sp02 not improving to over 90 with rest/albuterol, worst chest pain of life.   Patient instructed to follow up next week or sooner if symptoms worsen.  Patient verbalized agreement and understanding of treatment plan and had no further questions at this time  P2:  hand washing and cover cough  Discussed this could be virus, cold weather rhinitis or allergy flare with post nasal drip increase.  Would be rare to have breakthrough infection less than 90 days after covid infection and patient had third dose covid vaccine 1 week after infection due to immunocompromised methotrexate use at that time now additionally on daily prednisone and enbrel injections.  Discussed with patient booster dose recommended 6 months covid after 3rd dose but since he had monoclonal antibodies and 3rd dose within 7 days of each other may need to have 3rd dose repeated at 90 days after monoclonal antibody infusion date.  I do recommend flu vaccination also.  Continue flonase 1 spray each nostril BID, saline 2 sprays each nostril q2h wa prn congestion given 1 bottle from clinic stock.  Continue singulair  po  QHS and claritin  po daily.  Start keflex  po BID x 10 days #30 RF0 dispensed from [facility] to patient today  Denied personal or family history of ENT cancer.  Shower BID especially prior to bed. No evidence of systemic bacterial infection, non toxic and well hydrated.  I do not see where any further testing or imaging is necessary at this time.   I will suggest supportive care, rest, good hygiene and encourage the patient to take adequate fluids.  The patient is to return to clinic or EMERGENCY ROOM if symptoms worsen or change significantly.  Exitcare handout on sinusitis and sinus rinse printed and given to patient.  Patient verbalized agreement and understanding of treatment plan and had no further questions at this time.   P2:  Hand washing and cover cough  Patient may use normal saline nasal spray 2 sprays each nostril q2h wa as needed. flonase 1 spray each nostril BID  Singulair  po qhs Patient denied personal or family history of ENT cancer.  OTC antihistamine of choice claritin  po daily.  Avoid triggers if possible.  Shower prior to bedtime if exposed to triggers.  If allergic dust/dust mites recommend mattress/pillow covers/encasements; washing linens, vacuuming, sweeping, dusting weekly.  Call or return to clinic as needed if these symptoms worsen or fail to improve as  anticipated.  Patient verbalized understanding of instructions, agreed with plan of care and had no further questions at this time.  P2:  Avoidance and hand washing.

## 2020-10-06 NOTE — Patient Instructions (Addendum)
Albuterol inhalation powder What is this medicine? ALBUTEROL (al Gaspar Bidding) is a bronchodilator. It helps open up the airways in your lungs to make it easier to breathe. This medicine is used to treat and to prevent bronchospasm. This medicine may be used for other purposes; ask your health care provider or pharmacist if you have questions. COMMON BRAND NAME(S): ProAir digihaler, ProAir RespiClick What should I tell my health care provider before I take this medicine? They need to know if you have any of these conditions:  diabetes  heart disease or irregular heartbeat  high blood pressure  pheochromocytoma  seizures  thyroid disease  an unusual or allergic reaction to albuterol, levalbuterol, lactose, other medicines, foods, dyes, or preservatives  pregnant or trying to get pregnant  breast-feeding How should I use this medicine? This medicine is for inhalation through the mouth. Follow the directions on your prescription label. Take your medicine at regular intervals. Do not use more often than directed. Make sure that you are using your inhaler correctly. Ask you doctor or health care provider if you have any questions. Talk to your pediatrician regarding the use of this medicine in children. While this drug may be prescribed for children as young as 4 years for selected conditions, precautions do apply. Overdosage: If you think you have taken too much of this medicine contact a poison control center or emergency room at once. NOTE: This medicine is only for you. Do not share this medicine with others. What if I miss a dose? If you miss a dose, use it as soon as you can. If it is almost time for your next dose, use only that dose. Do not use double or extra doses. What may interact with this medicine?  anti-infectives like chloroquine and pentamidine  caffeine  cisapride  diuretics  medicines for colds  medicines for depression or for emotional or psychotic  conditions  medicines for weight loss including some herbal products  methadone  some antibiotics like clarithromycin, erythromycin, levofloxacin, and linezolid  some heart medicines  steroid hormones like dexamethasone, cortisone, hydrocortisone  theophylline  thyroid hormones This list may not describe all possible interactions. Give your health care provider a list of all the medicines, herbs, non-prescription drugs, or dietary supplements you use. Also tell them if you smoke, drink alcohol, or use illegal drugs. Some items may interact with your medicine. What should I watch for while using this medicine? Tell your doctor or health care professional if your symptoms do not improve. Do not use extra albuterol. If your asthma or bronchitis gets worse while you are using this medicine, call your doctor right away. What side effects may I notice from receiving this medicine? Side effects that you should report to your doctor or health care professional as soon as possible:  allergic reactions like skin rash, itching or hives, swelling of the face, lips, or tongue  breathing problems  chest pain  feeling faint or lightheaded, falls  high blood pressure  irregular heartbeat  fever  muscle cramps or weakness  pain, tingling, numbness in the hands or feet  vomiting Side effects that usually do not require medical attention (report to your doctor or health care professional if they continue or are bothersome):  changes in taste  cough  dry mouth  headache  nervousness or trembling  stomach upset  stuffy or runny nose  throat irritation  trouble sleeping This list may not describe all possible side effects. Call your doctor for medical  advice about side effects. You may report side effects to FDA at 1-800-FDA-1088. Where should I keep my medicine? Keep out of the reach of children. Store at room temperature between 15 and 25 degrees C (59 and 77 degrees F). Do  not expose inhaler to extreme heat, cold, or humidity. Throw away the inhaler 13 months after removing it from the foil pouch for the first time, when the dose counter displays "0", or after the expiration date on the package, whichever comes first. NOTE: This sheet is a summary. It may not cover all possible information. If you have questions about this medicine, talk to your doctor, pharmacist, or health care provider.  2020 Elsevier/Gold Standard (2019-03-12 12:56:49) Acute Bronchitis, Adult  Acute bronchitis is sudden or acute swelling of the air tubes (bronchi) in the lungs. Acute bronchitis causes these tubes to fill with mucus, which can make it hard to breathe. It can also cause coughing or wheezing. In adults, acute bronchitis usually goes away within 2 weeks. A cough caused by bronchitis may last up to 3 weeks. Smoking, allergies, and asthma can make the condition worse. What are the causes? This condition can be caused by germs and by substances that irritate the lungs, including:  Cold and flu viruses. The most common cause of this condition is the virus that causes the common cold.  Bacteria.  Substances that irritate the lungs, including: ? Smoke from cigarettes and other forms of tobacco. ? Dust and pollen. ? Fumes from chemical products, gases, or burned fuel. ? Other materials that pollute indoor or outdoor air.  Close contact with someone who has acute bronchitis. What increases the risk? The following factors may make you more likely to develop this condition:  A weak body's defense system, also called the immune system.  A condition that affects your lungs and breathing, such as asthma. What are the signs or symptoms? Common symptoms of this condition include:  Lung and breathing problems, such as: ? Coughing. This may bring up clear, yellow, or green mucus from your lungs (sputum). ? Wheezing. ? Having too much mucus in your lungs (chest congestion). ? Having  shortness of breath.  A fever.  Chills.  Aches and pains, including: ? Tightness in your chest and other body aches. ? A sore throat. How is this diagnosed? This condition is usually diagnosed based on:  Your symptoms and medical history.  A physical exam. You may also have other tests, including tests to rule out other conditions, such pneumonia. These tests include:  A test of lung function.  Test of a mucus sample to look for the presence of bacteria.  Tests to check the oxygen level in your blood.  Blood tests.  Chest X-ray. How is this treated? Most cases of acute bronchitis clear up over time without treatment. Your health care provider may recommend:  Drinking more fluids. This can thin your mucus, which may improve your breathing.  Taking a medicine for a fever or cough.  Using a device that gets medicine into your lungs (inhaler) to help improve breathing and control coughing.  Using a vaporizer or a humidifier. These are machines that add water to the air to help you breathe better. Follow these instructions at home: Activity  Get plenty of rest.  Return to your normal activities as told by your health care provider. Ask your health care provider what activities are safe for you. Lifestyle  Drink enough fluid to keep your urine pale yellow.  Do  not drink alcohol.  Do not use any products that contain nicotine or tobacco, such as cigarettes, e-cigarettes, and chewing tobacco. If you need help quitting, ask your health care provider. Be aware that: ? Your bronchitis will get worse if you smoke or breathe in other people's smoke (secondhand smoke). ? Your lungs will heal faster if you quit smoking. General instructions   Take over-the-counter and prescription medicines only as told by your health care provider.  Use an inhaler, vaporizer, or humidifier as told by your health care provider.  If you have a sore throat, gargle with a salt-water mixture  3-4 times a day or as needed. To make a salt-water mixture, completely dissolve -1 tsp (3-6 g) of salt in 1 cup (237 mL) of warm water.  Keep all follow-up visits as told by your health care provider. This is important. How is this prevented? To lower your risk of getting this condition again:  Wash your hands often with soap and water. If soap and water are not available, use hand sanitizer.  Avoid contact with people who have cold symptoms.  Try not to touch your mouth, nose, or eyes with your hands.  Avoid places where there are fumes from chemicals. Breathing these fumes will make your condition worse.  Get the flu shot every year. Contact a health care provider if:  Your symptoms do not improve after 2 weeks of treatment.  You vomit more than once or twice.  You have symptoms of dehydration such as: ? Dark urine. ? Dry skin or eyes. ? Increased thirst. ? Headaches. ? Confusion. ? Muscle cramps. Get help right away if you:  Cough up blood.  Feel pain in your chest.  Have severe shortness of breath.  Faint or keep feeling like you are going to faint.  Have a severe headache.  Have fever or chills that get worse. These symptoms may represent a serious problem that is an emergency. Do not wait to see if the symptoms will go away. Get medical help right away. Call your local emergency services (911 in the [place] Do not drive yourself to the hospital. Summary  Acute bronchitis is sudden (acute) inflammation of the air tubes (bronchi) between the windpipe and the lungs. In adults, acute bronchitis usually goes away within 2 weeks, although coughing may last 3 weeks or longer  Take over-the-counter and prescription medicines only as told by your health care provider.  Drink enough fluid to keep your urine pale yellow.  Contact a health care provider if your symptoms do not improve after 2 weeks of treatment.  Get help right away if you cough up blood, faint, or have  chest pain or shortness of breath. This information is not intended to replace advice given to you by your health care provider. Make sure you discuss any questions you have with your health care provider. Document Revised: 08/10/2019 Document Reviewed: 06/19/2019 Elsevier Patient Education  2020 [employer]. How to Perform a Sinus Rinse A sinus rinse is a home treatment that is used to rinse your sinuses with a sterile mixture of salt and water (saline solution). Sinuses are air-filled spaces in your skull behind the bones of your face and forehead that open into your nasal cavity. A sinus rinse can help to clear mucus, dirt, dust, or pollen from your nasal cavity. You may do a sinus rinse when you have a cold, a virus, nasal allergy symptoms, a sinus infection, or stuffiness in your nose or sinuses. Talk  with your health care provider about whether a sinus rinse might help you. What are the risks? A sinus rinse is generally safe and effective. However, there are a few risks, which include:  A burning sensation in your sinuses. This may happen if you do not make the saline solution as directed. Be sure to follow all directions when making the saline solution.  Nasal irritation.  Infection from contaminated water. This is rare, but possible. Do not do a sinus rinse if you have had ear or nasal surgery, ear infection, or blocked ears. Supplies needed:  Saline solution or powder.  Distilled or sterile water may be needed to mix with saline powder. ? You may use boiled and cooled tap water. Boil tap water for 5 minutes; cool until it is lukewarm. Use within 24 hours. ? Do not use regular tap water to mix with the saline solution.  Neti pot or nasal rinse bottle. These supplies release the saline solution into your nose and through your sinuses. Neti pots and nasal rinse bottles can be purchased at [employer], a health food store, or online. How to perform a sinus rinse  1. Wash  your hands with soap and water. 2. Wash your device according to the directions that came with the product and then dry it. 3. Use the solution that comes with your product or one that is sold separately in stores. Follow the mixing directions on the package if you need to mix with sterile or distilled water. 4. Fill the device with the amount of saline solution noted in the device instructions. 5. Stand over a sink and tilt your head sideways over the sink. 6. Place the spout of the device in your upper nostril (the one closer to the ceiling). 7. Gently pour or squeeze the saline solution into your nasal cavity. The liquid should drain out from the lower nostril if you are not too congested. 8. While rinsing, breathe through your open mouth. 9. Gently blow your nose to clear any mucus and rinse solution. Blowing too hard may cause ear pain. 10. Repeat in your other nostril. 11. Clean and rinse your device with clean water and then air-dry it. Talk with your health care provider or pharmacist if you have questions about how to do a sinus rinse. Summary  A sinus rinse is a home treatment that is used to rinse your sinuses with a sterile mixture of salt and water (saline solution).  A sinus rinse is generally safe and effective. Follow all instructions carefully.  Before doing a sinus rinse, talk with your health care provider about whether it would be helpful for you. This information is not intended to replace advice given to you by your health care provider. Make sure you discuss any questions you have with your health care provider. Document Revised: 09/23/2017 Document Reviewed: 09/23/2017 Elsevier Patient Education  2020 Else[employer] Inc. Sinusitis, Adult Sinusitis is inflammation of your sinuses. Sinuses are hollow spaces in the bones around your face. Your sinuses are located:  Around your eyes.  In the middle of your forehead.  Behind your nose.  In your cheekbones. Mucus normally  drains out of your sinuses. When your nasal tissues become inflamed or swollen, mucus can become trapped or blocked. This allows bacteria, viruses, and fungi to grow, which leads to infection. Most infections of the sinuses are caused by a virus. Sinusitis can develop quickly. It can last for up to 4 weeks (acute) or for more than 12  weeks (chronic). Sinusitis often develops after a cold. What are the causes? This condition is caused by anything that creates swelling in the sinuses or stops mucus from draining. This includes:  Allergies.  Asthma.  Infection from bacteria or viruses.  Deformities or blockages in your nose or sinuses.  Abnormal growths in the nose (nasal polyps).  Pollutants, such as chemicals or irritants in the air.  Infection from fungi (rare). What increases the risk? You are more likely to develop this condition if you:  Have a weak body defense system (immune system).  Do a lot of swimming or diving.  Overuse nasal sprays.  Smoke. What are the signs or symptoms? The main symptoms of this condition are pain and a feeling of pressure around the affected sinuses. Other symptoms include:  Stuffy nose or congestion.  Thick drainage from your nose.  Swelling and warmth over the affected sinuses.  Headache.  Upper toothache.  A cough that may get worse at night.  Extra mucus that collects in the throat or the back of the nose (postnasal drip).  Decreased sense of smell and taste.  Fatigue.  A fever.  Sore throat.  Bad breath. How is this diagnosed? This condition is diagnosed based on:  Your symptoms.  Your medical history.  A physical exam.  Tests to find out if your condition is acute or chronic. This may include: ? Checking your nose for nasal polyps. ? Viewing your sinuses using a device that has a light (endoscope). ? Testing for allergies or bacteria. ? Imaging tests, such as an MRI or CT scan. In rare cases, a bone biopsy may be  done to rule out more serious types of fungal sinus disease. How is this treated? Treatment for sinusitis depends on the cause and whether your condition is chronic or acute.  If caused by a virus, your symptoms should go away on their own within 10 days. You may be given medicines to relieve symptoms. They include: ? Medicines that shrink swollen nasal passages (topical intranasal decongestants). ? Medicines that treat allergies (antihistamines). ? A spray that eases inflammation of the nostrils (topical intranasal corticosteroids). ? Rinses that help get rid of thick mucus in your nose (nasal saline washes).  If caused by bacteria, your health care provider may recommend waiting to see if your symptoms improve. Most bacterial infections will get better without antibiotic medicine. You may be given antibiotics if you have: ? A severe infection. ? A weak immune system.  If caused by narrow nasal passages or nasal polyps, you may need to have surgery. Follow these instructions at home: Medicines  Take, use, or apply over-the-counter and prescription medicines only as told by your health care provider. These may include nasal sprays.  If you were prescribed an antibiotic medicine, take it as told by your health care provider. Do not stop taking the antibiotic even if you start to feel better. Hydrate and humidify   Drink enough fluid to keep your urine pale yellow. Staying hydrated will help to thin your mucus.  Use a cool mist humidifier to keep the humidity level in your home above 50%.  Inhale steam for 10-15 minutes, 3-4 times a day, or as told by your health care provider. You can do this in the bathroom while a hot shower is running.  Limit your exposure to cool or dry air. Rest  Rest as much as possible.  Sleep with your head raised (elevated).  Make sure you get enough sleep  each night. General instructions   Apply a warm, moist washcloth to your face 3-4 times a day or  as told by your health care provider. This will help with discomfort.  Wash your hands often with soap and water to reduce your exposure to germs. If soap and water are not available, use hand sanitizer.  Do not smoke. Avoid being around people who are smoking (secondhand smoke).  Keep all follow-up visits as told by your health care provider. This is important. Contact a health care provider if:  You have a fever.  Your symptoms get worse.  Your symptoms do not improve within 10 days. Get help right away if:  You have a severe headache.  You have persistent vomiting.  You have severe pain or swelling around your face or eyes.  You have vision problems.  You develop confusion.  Your neck is stiff.  You have trouble breathing. Summary  Sinusitis is soreness and inflammation of your sinuses. Sinuses are hollow spaces in the bones around your face.  This condition is caused by nasal tissues that become inflamed or swollen. The swelling traps or blocks the flow of mucus. This allows bacteria, viruses, and fungi to grow, which leads to infection.  If you were prescribed an antibiotic medicine, take it as told by your health care provider. Do not stop taking the antibiotic even if you start to feel better.  Keep all follow-up visits as told by your health care provider. This is important. This information is not intended to replace advice given to you by your health care provider. Make sure you discuss any questions you have with your health care provider. Document Revised: 04/28/2018 Document Reviewed: 04/28/2018 Elsevier Patient Education  2020 [employer].  Shortness of Breath, Adult Shortness of breath is when a person has trouble breathing enough air or when a person feels like she or he is having trouble breathing in enough air. Shortness of breath could be a sign of a medical problem. Follow these instructions at home:   Pay attention to any changes in your  symptoms.  Do not use any products that contain nicotine or tobacco, such as cigarettes, e-cigarettes, and chewing tobacco.  Do not smoke. Smoking is a common cause of shortness of breath. If you need help quitting, ask your health care provider.  Avoid things that can irritate your airways, such as: ? Mold. ? Dust. ? Air pollution. ? Chemical fumes. ? Things that can cause allergy symptoms (allergens), if you have allergies.  Keep your living space clean and free of mold and dust.  Rest as needed. Slowly return to your usual activities.  Take over-the-counter and prescription medicines only as told by your health care provider. This includes oxygen therapy and inhaled medicines.  Keep all follow-up visits as told by your health care provider. This is important. Contact a health care provider if:  Your condition does not improve as soon as expected.  You have a hard time doing your normal activities, even after you rest.  You have new symptoms. Get help right away if:  Your shortness of breath gets worse.  You have shortness of breath when you are resting.  You feel light-headed or you faint.  You have a cough that is not controlled with medicines.  You cough up blood.  You have pain with breathing.  You have pain in your chest, arms, shoulders, or abdomen.  You have a fever.  You cannot walk up stairs or exercise the  way that you normally do. These symptoms may represent a serious problem that is an emergency. Do not wait to see if the symptoms will go away. Get medical help right away. Call your local emergency services (911 in the [place] Do not drive yourself to the hospital. Summary  Shortness of breath is when a person has trouble breathing enough air. It can be a sign of a medical problem.  Avoid things that irritate your lungs, such as smoking, pollution, mold, and dust.  Pay attention to changes in your symptoms and contact your health care provider if you  have a hard time completing daily activities because of shortness of breath. This information is not intended to replace advice given to you by your health care provider. Make sure you discuss any questions you have with your health care provider. Document Revised: 04/28/2018 Document Reviewed: 04/28/2018 Elsevier Patient Education  2020 [employer].  Hypokalemia Hypokalemia means that the amount of potassium in the blood is lower than normal. Potassium is a chemical (electrolyte) that helps regulate the amount of fluid in the body. It also stimulates muscle tightening (contraction) and helps nerves work properly. Normally, most of the body's potassium is inside cells, and only a very small amount is in the blood. Because the amount in the blood is so small, minor changes to potassium levels in the blood can be life-threatening. What are the causes? This condition may be caused by: Antibiotic medicine. Diarrhea or vomiting. Taking too much of a medicine that helps you have a bowel movement (laxative) can cause diarrhea and lead to hypokalemia. Chronic kidney disease (CKD). Medicines that help the body get rid of excess fluid (diuretics). Eating disorders, such as bulimia. Low magnesium levels in the body. Sweating a lot. What are the signs or symptoms? Symptoms of this condition include: Weakness. Constipation. Fatigue. Muscle cramps. Mental confusion. Skipped heartbeats or irregular heartbeat (palpitations). Tingling or numbness. How is this diagnosed? This condition is diagnosed with a blood test. How is this treated? This condition may be treated by: Taking potassium supplements by mouth. Adjusting the medicines that you take. Eating more foods that contain a lot of potassium. If your potassium level is very low, you may need to get potassium through an IV and be monitored in the hospital. Follow these instructions at home:  Take over-the-counter and prescription medicines  only as told by your health care provider. This includes vitamins and supplements. Eat a healthy diet. A healthy diet includes fresh fruits and vegetables, whole grains, healthy fats, and lean proteins. If instructed, eat more foods that contain a lot of potassium. This includes: Nuts, such as peanuts and pistachios. Seeds, such as sunflower seeds and pumpkin seeds. Peas, lentils, and lima beans. Whole grain and bran cereals and breads. Fresh fruits and vegetables, such as apricots, avocado, bananas, cantaloupe, kiwi, oranges, tomatoes, asparagus, and potatoes. Orange juice. Tomato juice. Red meats. Yogurt. Keep all follow-up visits as told by your health care provider. This is important. Contact a health care provider if you: Have weakness that gets worse. Feel your heart pounding or racing. Vomit. Have diarrhea. Have diabetes (diabetes mellitus) and you have trouble keeping your blood sugar (glucose) in your target range. Get help right away if you: Have chest pain. Have shortness of breath. Have vomiting or diarrhea that lasts for more than 2 days. Faint. Summary Hypokalemia means that the amount of potassium in the blood is lower than normal. This condition is diagnosed with a blood test. Hypokalemia  may be treated by taking potassium supplements, adjusting the medicines that you take, or eating more foods that are high in potassium. If your potassium level is very low, you may need to get potassium through an IV and be monitored in the hospital. This information is not intended to replace advice given to you by your health care provider. Make sure you discuss any questions you have with your health care provider. Document Revised: 07/09/2018 Document Reviewed: 07/09/2018 Elsevier Patient Education  2020 [employer].

## 2020-10-07 ENCOUNTER — Telehealth: Payer: Self-pay | Admitting: Physician Assistant

## 2020-10-07 LAB — BASIC METABOLIC PANEL
BUN/Creatinine Ratio: 18 (ref 10–24)
BUN: 19 mg/dL (ref 8–27)
CO2: 21 mmol/L (ref 20–29)
Calcium: 9.7 mg/dL (ref 8.6–10.2)
Chloride: 104 mmol/L (ref 96–106)
Creatinine, Ser: 1.08 mg/dL (ref 0.76–1.27)
GFR calc Af Amer: 83 mL/min/{1.73_m2} (ref >59)
GFR calc non Af Amer: 72 mL/min/{1.73_m2} (ref >59)
Glucose: 97 mg/dL (ref 65–99)
Potassium: 3.7 mmol/L (ref 3.5–5.2)
Sodium: 140 mmol/L (ref 134–144)

## 2020-10-07 NOTE — Progress Notes (Signed)
RN spoke with pt in clinic. Bilateral breath sounds decreased, unchanged from yesterday, but no wheezing, crackling, etc. Pt reported O2 sat of 86-89% at home with 15 min recovery time sitting at table when he arrived home last night. This morning, he used albuterol inh at 0600 and in clinic at 1015 had about 5-7 min recovery period until he felt he was back at his baseline from walking across warehouse to clinic. O2 sat upon arrival was 96% and stayed 95-97% throughout visit.  BLE edema similar to yesterday at about trace-1+. Pt felt like he is a little improved from yesterday. Advised him of [facility] ER recommendations. He reports their office called him this morning and he is aware of recommendations but declines ER at this time. Reviewed red flag symptoms for this weekend, O2 steadily below 90%, chest pain, worsening ShOB, blue tint to lips or face, dizziness, confusion, syncope, etc.  He is concerned for ER wait times as well. Reviewed that [facility] and [facility] tend to have lower waits than main hospitals like [place], [place], [place]. He verbalizes agreement and understanding of red flags and plan of care. Sx and plan discussed with pt with NP Inetta Fermo on speaker phone. Will postpone CBC and chest xray at this time. NP to call pt over the weekend to check in. RN to see pt in clinic on Monday, and scheduled pt for f/u with NP in clinic on Tuesday 11/2, her next day on site.

## 2020-10-07 NOTE — Telephone Encounter (Signed)
Noted PA Dunn recommends patient r/o embolus go to ER.  RN Rolly Salter to follow up with patient this morning to see if symptoms improved with plan of care and if not improved will have patient evaluation by a provider today.

## 2020-10-07 NOTE — Telephone Encounter (Signed)
Placed call to pt.  He has been made aware that Ronie Spies, PA-C's recommendations was for him to go to the ED for evaluation for the problems in phone note below, received from [place], NP.  Pt agreed.

## 2020-10-07 NOTE — Telephone Encounter (Signed)
   Received following message in staff messages -  ===View-only below this line=== ----- Message ----- From: Barbaraann Barthel, NP Sent: 10/06/2020  10:00 PM EDT To: Laurann Montana, PA-C  Ryan Glass having DOE worse than usual drew his BMET today to make sure kidney function/electrolytes not worsening.  He feels this is still covid related restarted albuterol inhaler today.  Cardiac symptoms stable/his baseline e.g. leg swelling and no palpitations/irregular HR in clinic. He is keeping his sp02/bp log at home also 92% sitting 89-90 exertion RA  Sincerely, Albina Billet NP-C     From Tina's note it looks like she is not back in the office until next week - patient appears to have had Covid in August 2021. He is not usually hypoxic so this is very concerning to me. O2 sat is typically 96-97% by our records. Would be concerned that he needs to be ruled out for post-Covid pulmonary embolism high on the differential (and if this is not the case and he is hypoxic from CHF, Covid pneumonitis, residual PNA, this would require more urgent treatment as well). I would recommend he proceed to ED for evaluation. Please call pt to let him know. Will also cc to [facility] to let her know. Selene Peltzer

## 2020-10-09 ENCOUNTER — Telehealth: Payer: Self-pay | Admitting: Registered Nurse

## 2020-10-09 ENCOUNTER — Encounter: Payer: Self-pay | Admitting: Registered Nurse

## 2020-10-09 DIAGNOSIS — J209 Acute bronchitis, unspecified: Secondary | ICD-10-CM

## 2020-10-09 DIAGNOSIS — D649 Anemia, unspecified: Secondary | ICD-10-CM

## 2020-10-09 MED ORDER — ALBUTEROL SULFATE HFA 108 (90 BASE) MCG/ACT IN AERS: 1.0000 | INHALATION_SPRAY | RESPIRATORY_TRACT | 0 refills | Status: DC | PRN

## 2020-10-09 NOTE — Telephone Encounter (Signed)
Telephone message left for patient checking in to see how his breathing and sp02 at home running the past 24 hours.  I will call back again this afternoon after trying to reach a few other patients.  Late entry spoke with patient and RN Rolly Salter via telephone when both in clinic Friday 10/07/2020 at 1024.  Patient verified he spoke with PA Dunn staff and verbalized understanding she would prefer he go to ER today to be checked for pulmonary embolus based on symptoms in past 24 hours.  Discussed with patient I notified PA Dunn of his symptoms since he has upcoming appt with her and to verify if labs need to be drawn again for BMET she requested next week.  Discussed with patient reviewing epic last night 10/06/2020 noted CBC with anemia could be related to methotrexate, occult blood loss or chronic disease anemia.  Holding on CBC and chest xray today as sp02 improved, VSS, symptoms slightly improved and more consistent with bronchitis/early pneumonia which he is being treated for since yesterday.  Patient brought his home sp02 monitor to clinic and it is reading 1-2 % lower than clinic equipment per [employer].  RN Rolly Salter reported patient sp02 RA 92-94% today and trace pitting edema lower extremities today.  Discussed with patient the decreased sp02 could be related to anemia, CHF, bronchitis, pneumonia, covid sequelae, pulmonary embolus, allergy flare.  RN Rolly Salter reported on auscultation lung fields decreased basilar breath sounds again today but otherwise clear.  Patient with shortness of breath after walking across warehouse again but resolves with sitting.  Patient reported he feels about the same but breathing a bit easier since restarting albuterol inhaler and taking keflex.  Reiterated ER precautions with patient e.g. chest pain, syncope, blue lips/face, sp02 RA less than 90% consistently and not improving with albuterol/rest.  Discussed that I would call him this weekend to follow up with him regarding symptoms  and home sp02 monitor readings.  Patient alert and oriented x3, spoke full sentences without difficulty.  Patient verbalized understanding information/instructions, agreed with plan of care and had no further questions at this time.

## 2020-10-09 NOTE — Progress Notes (Signed)
RN Rolly Salter note reviewed and agreed with plan of care

## 2020-10-09 NOTE — Telephone Encounter (Signed)
Patient contacted via telephone.  Reported that he was unable to pick up respiclick proair because pharmacy out of stock and had to order for him will be available Monday or Tuesday but they had the other albuterol available.  Patient reported his albuterol inhaler previously dispensed Aug 2021 still had some doses and he has been using it this weekend.  He is still feeling the same.  Leg swelling the same can just see sock lines.  Denied fever/chills/nausea/vomiting/diarrhea.   Discussed with patient I would call pharmacy to discuss which albuterol they had available and change his Rx if needed so he can pick up today.  Having shortness of breath with going up and down stairs in his house.  Couldn't find his sp02 monitor at home yesterday thinks he left it at work.  Getting headache at night wearing his CPAP so taking it off midway through the night due to pain above his eyes.  He has been taking his keflex and using flonase/nasal saline/singulair and claritin.  Nasal saline typically twice a day.  Wondering if he can take more than that or if salt will affect his blood pressure. Discussed if he was eating a lot of salt it would raise blood pressure/worsen leg swelling but sinus rinse typically only stays local in sinuses and is removed from body with blowing his nose.  Discussed he can use nasal saline every 1-2 hours.  Continue flonase morning and evening (twice a day) and taking his usual allergy medication claritin and singulair once a day 10mg  each.   He reminded me he has appt with PCM later this week and wondering if he should ask for chest xray.  I do agree a chest xray may be indicated this week possibly earlier than his appt at the end of the week and I want him to see RN tomorrow morning in clinic for re-evaluation so she can listen to his lungs and check his oxygen level.  CBC to be drawn nonfasting tomorrow also.  Last CBC Sep improved from Aug 2021 but still slight anemia. Continue ER  precautions if confusion, blue lips/face, chest pain worsening, syncope patient to go to ER for re-evaluation today.   Patient verbalized understanding information/instructions, agreed with plan of care and had no further questions at this time.  Contacted Sep 2021 pharmacy staff and she reported 8.5mg  albuterol MDI available in stock.  New electronic Rx sent to pharmacy and cancelled proair respiclick Rx.  She stated she started processing rx for dispensation and patient can pick up this afternoon.    Patient contacted via telephone again and notified new Rx ordered and can be picked up later today.  Patient verbalized understanding information/instructions, agreed with plan of care and had no further questions at this time.

## 2020-10-09 NOTE — Progress Notes (Signed)
Noted risk of post covid pneumonitis and PE

## 2020-10-10 ENCOUNTER — Ambulatory Visit: Payer: Self-pay | Admitting: *Deleted

## 2020-10-10 ENCOUNTER — Other Ambulatory Visit: Payer: Self-pay

## 2020-10-10 DIAGNOSIS — D649 Anemia, unspecified: Secondary | ICD-10-CM

## 2020-10-10 NOTE — Progress Notes (Signed)
CBC drawn per NP orders. See separate tcon for details.

## 2020-10-10 NOTE — Telephone Encounter (Signed)
Noted improving dyspnea with exertion and resting sp02 RA; still taking keflex, BBS CTA today.  NP appt pending tomorrow continue with plan of care as previously discussed.

## 2020-10-10 NOTE — Telephone Encounter (Signed)
Pt in to clinic for f/u. ShOB upon arrival after walking across warehouse. However not as out of breath as he was on Friday when he did the same thing. About 3-4 minutes for recovery time today. Last used albuterol inh about 0540 today. O2 sat 95% upon arrival. After rest, 94%. Pulse 70, regular. Reports he checked O2 at arrival to work this morning as he left his monitor at work all weekend. Stated that monitor read 91-94% this morning. BP 148/82. Picked up new albuterol yesterday.   BLE edema same as previous, 1+.   Bilateral lungs now CTA, no decreased sounds, wheezing, stridor, etc.   CBC drawn for anemia evaluation today. NP appt scheduled already for tomorrow at 0930.

## 2020-10-11 ENCOUNTER — Ambulatory Visit: Payer: Self-pay | Admitting: Registered Nurse

## 2020-10-11 VITALS — BP 145/93 | HR 62

## 2020-10-11 DIAGNOSIS — J3089 Other allergic rhinitis: Secondary | ICD-10-CM

## 2020-10-11 DIAGNOSIS — D649 Anemia, unspecified: Secondary | ICD-10-CM

## 2020-10-11 DIAGNOSIS — J209 Acute bronchitis, unspecified: Secondary | ICD-10-CM

## 2020-10-11 LAB — CBC WITH DIFFERENTIAL/PLATELET
Basophils Absolute: 0 10*3/uL (ref 0.0–0.2)
Basos: 0 %
EOS (ABSOLUTE): 0 10*3/uL (ref 0.0–0.4)
Eos: 0 %
Hematocrit: 39.1 % (ref 37.5–51.0)
Hemoglobin: 13.8 g/dL (ref 13.0–17.7)
Immature Grans (Abs): 0 10*3/uL (ref 0.0–0.1)
Immature Granulocytes: 0 %
Lymphocytes Absolute: 0.6 10*3/uL — ABNORMAL LOW (ref 0.7–3.1)
Lymphs: 11 %
MCH: 34.9 pg — ABNORMAL HIGH (ref 26.6–33.0)
MCHC: 35.3 g/dL (ref 31.5–35.7)
MCV: 99 fL — ABNORMAL HIGH (ref 79–97)
Monocytes Absolute: 0.2 10*3/uL (ref 0.1–0.9)
Monocytes: 4 %
Neutrophils Absolute: 4.4 10*3/uL (ref 1.4–7.0)
Neutrophils: 85 %
Platelets: 169 10*3/uL (ref 150–450)
RBC: 3.95 x10E6/uL — ABNORMAL LOW (ref 4.14–5.80)
RDW: 14 % (ref 11.6–15.4)
WBC: 5.3 10*3/uL (ref 3.4–10.8)

## 2020-10-11 NOTE — Progress Notes (Signed)
Subjective:    Patient ID: Ryan Glass, male    DOB: January 13, 1956, 64 y.o.   MRN: [id number]  64y/o Hispanic male presenting for follow up of bronchitis/sinusitis. Improving daily. Less ShOB today after walking across warehouse. Less recovery time needed compared to yesterday and Friday. Using Albuterol inh about 3x/day. Feels his fatigue is worst in the evenings after arriving home from work. Got home at 1500 yesterday and slept until 2000.  Able to wear his cpap all night now and not getting headache.  Some ear pain intermittent denied discharge.  Reported he read mychart message on CBC results this morning.  Wondering if he should still get chest xray/discuss with PCM.  covid 07/12/2020 had monoclonal antibody infusion.  Flu shot received at work in the past month.  Leg swelling and back pain his usual not flaring.  Denied palpitations today.     Review of Systems  Constitutional: Positive for fatigue. Negative for activity change, appetite change, chills, diaphoresis and fever.  HENT: Positive for ear pain and postnasal drip. Negative for congestion, dental problem, ear discharge, facial swelling, hearing loss, mouth sores, nosebleeds, rhinorrhea, sinus pressure, sinus pain, sneezing, sore throat, tinnitus and trouble swallowing.   Eyes: Negative for photophobia and visual disturbance.  Respiratory: Positive for shortness of breath. Negative for cough, choking, chest tightness, wheezing and stridor.   Cardiovascular: Positive for leg swelling. Negative for chest pain.  Gastrointestinal: Negative for diarrhea, nausea and vomiting.  Endocrine: Negative for cold intolerance and heat intolerance.  Genitourinary: Negative for difficulty urinating.  Musculoskeletal: Positive for arthralgias, back pain and myalgias. Negative for gait problem, joint swelling, neck pain and neck stiffness.  Skin: Negative for rash.  Allergic/Immunologic: Positive for environmental allergies and immunocompromised  state. Negative for food allergies.  Neurological: Negative for dizziness, tremors, seizures, syncope, facial asymmetry, speech difficulty, weakness, light-headedness, numbness and headaches.  Psychiatric/Behavioral: Negative for agitation, confusion and sleep disturbance.       Objective:   Physical Exam Vitals and nursing note reviewed.  Constitutional:      General: He is awake. He is not in acute distress.    Appearance: Normal appearance. He is well-developed and well-groomed. He is obese. He is not ill-appearing, toxic-appearing or diaphoretic.  HENT:     Head: Normocephalic and atraumatic.     Jaw: There is normal jaw occlusion.     Salivary Glands: Right salivary gland is not diffusely enlarged or tender. Left salivary gland is not diffusely enlarged or tender.     Right Ear: Hearing, ear canal and external ear normal. A middle ear effusion is present. There is no impacted cerumen.     Left Ear: Hearing, ear canal and external ear normal. A middle ear effusion is present. There is no impacted cerumen.     Nose: Nose normal. No congestion or rhinorrhea.     Right Turbinates: Not enlarged, swollen or pale.     Left Turbinates: Not enlarged, swollen or pale.     Right Sinus: No maxillary sinus tenderness or frontal sinus tenderness.     Left Sinus: No maxillary sinus tenderness or frontal sinus tenderness.     Mouth/Throat:     Lips: Pink. No lesions.     Mouth: Mucous membranes are moist. No lacerations, oral lesions or angioedema.     Pharynx: Oropharynx is clear. No oropharyngeal exudate or posterior oropharyngeal erythema.     Comments: Bilateral allergic shiners; lower eyelids 1+/4 nonpitting edema; bilateral TMs air fluid level clear  Eyes:     General: Lids are normal. Vision grossly intact. Gaze aligned appropriately. Allergic shiner present. No visual field deficit or scleral icterus.       Right eye: No discharge.        Left eye: No discharge.     Extraocular Movements:  Extraocular movements intact.     Conjunctiva/sclera: Conjunctivae normal.     Pupils: Pupils are equal, round, and reactive to light.  Neck:     Thyroid: No thyromegaly or thyroid tenderness.     Trachea: Trachea and phonation normal.  Cardiovascular:     Rate and Rhythm: Normal rate and regular rhythm.     Pulses: Normal pulses.          Radial pulses are 2+ on the right side and 2+ on the left side.     Heart sounds: Normal heart sounds. No murmur heard.  No friction rub. No gallop.      Comments: Bilateral sock line 1+ ankles; nonpitting lower extremity equal bilaterally nontender Pulmonary:     Effort: Pulmonary effort is normal. No respiratory distress.     Breath sounds: Normal breath sounds and air entry. No stridor, decreased air movement or transmitted upper airway sounds. No decreased breath sounds, wheezing, rhonchi or rales.     Comments: Patient reported he waited in hallway for a bit before coming into clinic due to line; no shortness of breath noted spoke full sentences without difficulty; no cough observed in exam room; wearing mask due to covid 19 pandemic Abdominal:     Palpations: Abdomen is soft.  Musculoskeletal:        General: No swelling, tenderness or signs of injury. Normal range of motion.     Right shoulder: No swelling, deformity, laceration or crepitus.     Left shoulder: No swelling, deformity, laceration or crepitus.     Right elbow: No swelling, deformity or lacerations.     Left elbow: No swelling, deformity or lacerations.     Right hand: No swelling or lacerations. Normal strength.     Left hand: No swelling or lacerations. Normal strength.     Cervical back: Normal range of motion and neck supple. No swelling, edema, deformity, erythema, signs of trauma, lacerations, rigidity, torticollis, tenderness or crepitus.     Thoracic back: No swelling, edema, signs of trauma or lacerations.     Lumbar back: No swelling, edema, signs of trauma or lacerations.      Right hip: No deformity, lacerations or crepitus. Normal strength.     Left hip: No deformity, lacerations or crepitus. Normal strength.     Right lower leg: 1+ Edema present.     Left lower leg: 1+ Edema present.     Right ankle: Swelling present. No deformity, ecchymosis or lacerations. No tenderness.     Left ankle: Swelling present. No deformity, ecchymosis or lacerations. No tenderness.     Comments: Patient baseline lower extremity swelling and chronic back pain  Lymphadenopathy:     Head:     Right side of head: No submental, submandibular, tonsillar, preauricular, posterior auricular or occipital adenopathy.     Left side of head: No submental, submandibular, tonsillar, preauricular, posterior auricular or occipital adenopathy.     Cervical: No cervical adenopathy.     Right cervical: No superficial cervical adenopathy.    Left cervical: No superficial cervical adenopathy.  Skin:    General: Skin is warm and dry.     Capillary Refill: Capillary refill takes less  than 2 seconds.     Coloration: Skin is not ashen, cyanotic, jaundiced, mottled, pale or sallow.     Findings: No abrasion, abscess, acne, bruising, burn, ecchymosis, erythema, signs of injury, laceration, lesion, petechiae, rash or wound.     Nails: There is no clubbing.  Neurological:     General: No focal deficit present.     Mental Status: He is alert and oriented to person, place, and time. Mental status is at baseline.     GCS: GCS eye subscore is 4. GCS verbal subscore is 5. GCS motor subscore is 6.     Cranial Nerves: Cranial nerves are intact. No cranial nerve deficit, dysarthria or facial asymmetry.     Sensory: Sensation is intact. No sensory deficit.     Motor: Motor function is intact. No weakness, tremor, atrophy, abnormal muscle tone or seizure activity.     Coordination: Coordination is intact. Coordination normal.     Gait: Gait is intact. Gait normal.     Comments: Gait sure and steady in clinic  in/out of chair without difficulty; bilateral hand grasp equal 5/5  Psychiatric:        Attention and Perception: Attention and perception normal.        Mood and Affect: Mood and affect normal.        Speech: Speech normal.        Behavior: Behavior normal. Behavior is cooperative.        Thought Content: Thought content normal.        Cognition and Memory: Cognition and memory normal.        Judgment: Judgment normal.           Assessment & Plan:  A-acute bronchitis subsequent visit, anemia unspecified, nonseasonal allergic rhinitis  P-BMET WNL.  sp02 back to baseline.  Continue albuterol MDI 1-2 puffs po q4-6h prn SOB/wheeze  Finish keflex 500mg  po BID x 10 days started on 28 Oct previously dispensed from [facility] to patient.  Patient on prednisone 10mg  po daily for arthritis already  Rx Albuterol possible side effects increased heart rate, hand tremors.  Patient has home sp02 monitor and BP machine and keeps log.  Encouraged him to continue daily check/log of both and notify clinic staff if decreasing more especially if below 90 and does not improve with rest/albuterol.  Must see provider same day if staying below 90 e.g. PCM/UC/ER.  Discussed I am not in clinic again tomorrow return on 4 Nov and clinic closed 3 Nov.  [facility] and Cardiology follow up pending November already scheduled with PA Dunn.  DDx long covid sequelae, pneumonia, CHF.   Bronchitis simple, community acquired, may have started as viral (probably respiratory syncytial, parainfluenza, influenza, or adenovirus), but now evidence of acute purulent bronchitis with resultant bronchial edema and mucus formation.  Viruses are the most common cause of bronchial inflammation in otherwise healthy adults with acute bronchitis.  The appearance of sputum is not predictive of whether a bacterial infection is present.  Purulent sputum is most often caused by viral infections.  There are a small portion of those caused by non-viral agents  being Mycoplama pneumonia.  Microscopic examination or C&S of sputum in the healthy adult with acute bronchitis is generally not helpful (usually negative or normal respiratory flora) other considerations being cough from upper respiratory tract infections, sinusitis or allergic syndromes (mild asthma or viral pneumonia).  Differential Diagnoses:  reactive airway disease (asthma, allergic aspergillosis (eosinophilia), chronic bronchitis, respiratory infection (sinusitis, common cold,  pneumonia), congestive heart failure, reflux esophagitis, bronchogenic tumor, aspiration syndromes and/or exposure to pulmonary irritants/smoke. Without high fever, severe dyspnea, lack of physical findings or other risk factors, I will hold on a chest radiograph and CBC at this time.  Discuss chest xray with PCM if still feeling dyspnea with exertion as history covid this year and lung inflammation has been noted post covid infection.  Symptoms improving and sp02 back to baseline so holding on imaging today.   Exitcare handouts on bronchitis and inhaler use.  ER if hemopthysis, SOB and sp02 not improving to over 90 with rest/albuterol, worst chest pain of life.   Patient instructed to follow up prn if symptoms worsen.  Patient verbalized agreement and understanding of treatment plan and had no further questions at this time  P2:  hand washing and cover cough  Discussed this could be virus, cold weather rhinitis or allergy flare with post nasal drip increase.  Would be rare to have breakthrough infection less than 90 days after covid infection and patient had third dose covid vaccine 1 week after infection due to immunocompromised methotrexate use at that time now additionally on daily prednisone and enbrel injections.  Discussed with patient booster dose recommended 6 months covid after 3rd dose but since he had monoclonal antibodies and 3rd dose within 7 days of each other may need to have 3rd dose repeated at 90 days after  monoclonal antibody infusion date.  I do recommend flu vaccination also.  Continue flonase 1 spray each nostril BID, saline 2 sprays each nostril q2h wa prn congestion given 1 bottle from clinic stock.  Continue singulair 10mg  po QHS and claritin 10mg  po daily.  Start keflex 500mg  po BID x 10 days #30 RF0 dispensed from [facility] to patient today  Denied personal or family history of ENT cancer.  Shower BID especially prior to bed. No evidence of systemic bacterial infection, non toxic and well hydrated.  I do not see where any further testing or imaging is necessary at this time.   I will suggest supportive care, rest, good hygiene and encourage the patient to take adequate fluids.  The patient is to return to clinic or EMERGENCY ROOM if symptoms worsen or change significantly.  Exitcare handout on sinusitis and sinus rinse printed and given to patient.  Patient verbalized agreement and understanding of treatment plan and had no further questions at this time.   P2:  Hand washing and cover cough  Encouraged patient to continue use normal saline nasal spray 2 sprays each nostril q2h wa as needed. flonase 1 spray each nostril BID  Singulair 10mg  po qhs Patient denied personal or family history of ENT cancer.  OTC antihistamine of choice claritin 10mg  po daily.  Avoid triggers if possible.  Shower prior to bedtime if exposed to triggers.  If allergic dust/dust mites recommend mattress/pillow covers/encasements; washing linens, vacuuming, sweeping, dusting weekly.  Call or return to clinic as needed if these symptoms worsen or fail to improve as anticipated.  Patient verbalized understanding of instructions, agreed with plan of care and had no further questions at this time.  P2:  Avoidance and hand washing.  Anemia could be related to chronic disease (HTN, obesity, arthritis), methotrexate use, post covid sequelae as new this year.  Improving CBC results yesterday but patient still fatigued.  Recent  sinusitis/bronchitis could be exacerbating symptoms as was not tolerating CPAP either/decreased quality of sleep in past week. Patient reported colon cancer screening repeat due this fall encouraged  follow up with his GI provider and to discuss with his rheumatology provider (methotrexate Rx.).  Exitcare handout anemia.  Patient verbalized understanding information/instructions, agreed with plan of care and had no further questions at this time.

## 2020-10-11 NOTE — Patient Instructions (Signed)
How to Use a Metered Dose Inhaler A metered dose inhaler is a handheld device for taking medicine that must be breathed into the lungs (inhaled). The device can be used to deliver a variety of inhaled medicines, including:  Quick relief or rescue medicines, such as bronchodilators.  Controller medicines, such as corticosteroids. The medicine is delivered by pushing down on a metal canister to release a preset amount of spray and medicine. Each device contains the amount of medicine that is needed for a preset number of uses (inhalations). Your health care provider may recommend that you use a spacer with your inhaler to help you take the medicine more effectively. A spacer is a plastic tube with a mouthpiece on one end and an opening that connects to the inhaler on the other end. A spacer holds the medicine in a tube for a short time, which allows you to inhale more medicine. What are the risks? If you do not use your inhaler correctly, medicine might not reach your lungs to help you breathe. Inhaler medicine can cause side effects, such as:  Mouth or throat infection.  Cough.  Hoarseness.  Headache.  Nausea and vomiting.  Lung infection (pneumonia) in people who have a lung condition called COPD. How to use a metered dose inhaler without a spacer  1. Remove the cap from the inhaler. 2. If you are using the inhaler for the first time, shake it for 5 seconds, turn it away from your face, then release 4 puffs into the air. This is called priming. 3. Shake the inhaler for 5 seconds. 4. Position the inhaler so the top of the canister faces up. 5. Put your index finger on the top of the medicine canister. Support the bottom of the inhaler with your thumb. 6. Breathe out normally and as completely as possible, away from the inhaler. 7. Either place the inhaler between your teeth and close your lips tightly around the mouthpiece, or hold the inhaler 1-2 inches (2.5-5 cm) away from your open  mouth. Keep your tongue down out of the way. If you are unsure which technique to use, ask your health care provider. 8. Press the canister down with your index finger to release the medicine, then inhale deeply and slowly through your mouth (not your nose) until your lungs are completely filled. Inhaling should take 4-6 seconds. 9. Hold the medicine in your lungs for 5-10 seconds (10 seconds is best). This helps the medicine get into the small airways of your lungs. 10. With your lips in a tight circle (pursed), breathe out slowly. 11. Repeat steps 3-10 until you have taken the number of puffs that your health care provider directed. Wait about 1 minute between puffs or as directed. 12. Put the cap on the inhaler. 13. If you are using a steroid inhaler, rinse your mouth with water, gargle, and spit out the water. Do not swallow the water. How to use a metered dose inhaler with a spacer  1. Remove the cap from the inhaler. 2. If you are using the inhaler for the first time, shake it for 5 seconds, turn it away from your face, then release 4 puffs into the air. This is called priming. 3. Shake the inhaler for 5 seconds. 4. Place the open end of the spacer onto the inhaler mouthpiece. 5. Position the inhaler so the top of the canister faces up and the spacer mouthpiece faces you. 6. Put your index finger on the top of the medicine canister.  Support the bottom of the inhaler and the spacer with your thumb. 7. Breathe out normally and as completely as possible, away from the spacer. 8. Place the spacer between your teeth and close your lips tightly around it. Keep your tongue down out of the way. 9. Press the canister down with your index finger to release the medicine, then inhale deeply and slowly through your mouth (not your nose) until your lungs are completely filled. Inhaling should take 4-6 seconds. 10. Hold the medicine in your lungs for 5-10 seconds (10 seconds is best). This helps the  medicine get into the small airways of your lungs. 11. With your lips in a tight circle (pursed), breathe out slowly. 12. Repeat steps 3-11 until you have taken the number of puffs that your health care provider directed. Wait about 1 minute between puffs or as directed. 13. Remove the spacer from the inhaler and put the cap on the inhaler. 14. If you are using a steroid inhaler, rinse your mouth with water, gargle, and spit out the water. Do not swallow the water. Follow these instructions at home:  Take your inhaled medicine only as told by your health care provider. Do not use the inhaler more than directed by your health care provider.  Keep all follow-up visits as told by your health care provider. This is important.  If your inhaler has a counter, you can check it to determine how full your inhaler is. If your inhaler does not have a counter, ask your health care provider when you will need to refill your inhaler and write the refill date on a calendar or on your inhaler canister. Note that you cannot know when an inhaler is empty by shaking it.  Follow directions on the package insert for care and cleaning of your inhaler and spacer. Contact a health care provider if:  Symptoms are only partially relieved with your inhaler.  You are having trouble using your inhaler.  You have an increase in phlegm.  You have headaches. Get help right away if:  You feel little or no relief after using your inhaler.  You have dizziness.  You have a fast heart rate.  You have chills or a fever.  You have night sweats.  There is blood in your phlegm. Summary  A metered dose inhaler is a handheld device for taking medicine that must be breathed into the lungs (inhaled).  The medicine is delivered by pushing down on a metal canister to release a preset amount of spray and medicine.  Each device contains the amount of medicine that is needed for a preset number of uses (inhalations). This  information is not intended to replace advice given to you by your health care provider. Make sure you discuss any questions you have with your health care provider. Document Revised: 11/08/2017 Document Reviewed: 10/16/2016 Elsevier Patient Education  [place]. Acute Bronchitis, Adult  Acute bronchitis is sudden or acute swelling of the air tubes (bronchi) in the lungs. Acute bronchitis causes these tubes to fill with mucus, which can make it hard to breathe. It can also cause coughing or wheezing. In adults, acute bronchitis usually goes away within 2 weeks. A cough caused by bronchitis may last up to 3 weeks. Smoking, allergies, and asthma can make the condition worse. What are the causes? This condition can be caused by germs and by substances that irritate the lungs, including:  Cold and flu viruses. The most common cause of this condition is  the virus that causes the common cold.  Bacteria.  Substances that irritate the lungs, including: ? Smoke from cigarettes and other forms of tobacco. ? Dust and pollen. ? Fumes from chemical products, gases, or burned fuel. ? Other materials that pollute indoor or outdoor air.  Close contact with someone who has acute bronchitis. What increases the risk? The following factors may make you more likely to develop this condition:  A weak body's defense system, also called the immune system.  A condition that affects your lungs and breathing, such as asthma. What are the signs or symptoms? Common symptoms of this condition include:  Lung and breathing problems, such as: ? Coughing. This may bring up clear, yellow, or green mucus from your lungs (sputum). ? Wheezing. ? Having too much mucus in your lungs (chest congestion). ? Having shortness of breath.  A fever.  Chills.  Aches and pains, including: ? Tightness in your chest and other body aches. ? A sore throat. How is this diagnosed? This condition is usually diagnosed  based on:  Your symptoms and medical history.  A physical exam. You may also have other tests, including tests to rule out other conditions, such pneumonia. These tests include:  A test of lung function.  Test of a mucus sample to look for the presence of bacteria.  Tests to check the oxygen level in your blood.  Blood tests.  Chest X-ray. How is this treated? Most cases of acute bronchitis clear up over time without treatment. Your health care provider may recommend:  Drinking more fluids. This can thin your mucus, which may improve your breathing.  Taking a medicine for a fever or cough.  Using a device that gets medicine into your lungs (inhaler) to help improve breathing and control coughing.  Using a vaporizer or a humidifier. These are machines that add water to the air to help you breathe better. Follow these instructions at home: Activity  Get plenty of rest.  Return to your normal activities as told by your health care provider. Ask your health care provider what activities are safe for you. Lifestyle  Drink enough fluid to keep your urine pale yellow.  Do not drink alcohol.  Do not use any products that contain nicotine or tobacco, such as cigarettes, e-cigarettes, and chewing tobacco. If you need help quitting, ask your health care provider. Be aware that: ? Your bronchitis will get worse if you smoke or breathe in other people's smoke (secondhand smoke). ? Your lungs will heal faster if you quit smoking. General instructions   Take over-the-counter and prescription medicines only as told by your health care provider.  Use an inhaler, vaporizer, or humidifier as told by your health care provider.  If you have a sore throat, gargle with a salt-water mixture 3-4 times a day or as needed. To make a salt-water mixture, completely dissolve -1 tsp (3-6 g) of salt in 1 cup (237 mL) of warm water.  Keep all follow-up visits as told by your health care provider.  This is important. How is this prevented? To lower your risk of getting this condition again:  Wash your hands often with soap and water. If soap and water are not available, use hand sanitizer.  Avoid contact with people who have cold symptoms.  Try not to touch your mouth, nose, or eyes with your hands.  Avoid places where there are fumes from chemicals. Breathing these fumes will make your condition worse.  Get the flu shot every  year. Contact a health care provider if:  Your symptoms do not improve after 2 weeks of treatment.  You vomit more than once or twice.  You have symptoms of dehydration such as: ? Dark urine. ? Dry skin or eyes. ? Increased thirst. ? Headaches. ? Confusion. ? Muscle cramps. Get help right away if you:  Cough up blood.  Feel pain in your chest.  Have severe shortness of breath.  Faint or keep feeling like you are going to faint.  Have a severe headache.  Have fever or chills that get worse. These symptoms may represent a serious problem that is an emergency. Do not wait to see if the symptoms will go away. Get medical help right away. Call your local emergency services (911 in the [place] Do not drive yourself to the hospital. Summary  Acute bronchitis is sudden (acute) inflammation of the air tubes (bronchi) between the windpipe and the lungs. In adults, acute bronchitis usually goes away within 2 weeks, although coughing may last 3 weeks or longer  Take over-the-counter and prescription medicines only as told by your health care provider.  Drink enough fluid to keep your urine pale yellow.  Contact a health care provider if your symptoms do not improve after 2 weeks of treatment.  Get help right away if you cough up blood, faint, or have chest pain or shortness of breath. This information is not intended to replace advice given to you by your health care provider. Make sure you discuss any questions you have with your health care  provider. Document Revised: 08/10/2019 Document Reviewed: 06/19/2019 Elsevier Patient Education  [place] of Breath, Adult Shortness of breath is when a person has trouble breathing enough air or when a person feels like she or he is having trouble breathing in enough air. Shortness of breath could be a sign of a medical problem. Follow these instructions at home:   Pay attention to any changes in your symptoms.  Do not use any products that contain nicotine or tobacco, such as cigarettes, e-cigarettes, and chewing tobacco.  Do not smoke. Smoking is a common cause of shortness of breath. If you need help quitting, ask your health care provider.  Avoid things that can irritate your airways, such as: ? Mold. ? Dust. ? Air pollution. ? Chemical fumes. ? Things that can cause allergy symptoms (allergens), if you have allergies.  Keep your living space clean and free of mold and dust.  Rest as needed. Slowly return to your usual activities.  Take over-the-counter and prescription medicines only as told by your health care provider. This includes oxygen therapy and inhaled medicines.  Keep all follow-up visits as told by your health care provider. This is important. Contact a health care provider if:  Your condition does not improve as soon as expected.  You have a hard time doing your normal activities, even after you rest.  You have new symptoms. Get help right away if:  Your shortness of breath gets worse.  You have shortness of breath when you are resting.  You feel light-headed or you faint.  You have a cough that is not controlled with medicines.  You cough up blood.  You have pain with breathing.  You have pain in your chest, arms, shoulders, or abdomen.  You have a fever.  You cannot walk up stairs or exercise the way that you normally do. These symptoms may represent a serious problem that is an emergency. Do not wait  to see if the  symptoms will go away. Get medical help right away. Call your local emergency services (911 in the [place] Do not drive yourself to the hospital. Summary  Shortness of breath is when a person has trouble breathing enough air. It can be a sign of a medical problem.  Avoid things that irritate your lungs, such as smoking, pollution, mold, and dust.  Pay attention to changes in your symptoms and contact your health care provider if you have a hard time completing daily activities because of shortness of breath. This information is not intended to replace advice given to you by your health care provider. Make sure you discuss any questions you have with your health care provider. Document Revised: 04/28/2018 Document Reviewed: 04/28/2018 Elsevier Patient Education  [place]. Anemia  Anemia is a condition in which you do not have enough red blood cells or hemoglobin. Hemoglobin is a substance in red blood cells that carries oxygen. When you do not have enough red blood cells or hemoglobin (are anemic), your body cannot get enough oxygen and your organs may not work properly. As a result, you may feel very tired or have other problems. What are the causes? Common causes of anemia include:  Excessive bleeding. Anemia can be caused by excessive bleeding inside or outside the body, including bleeding from the intestine or from periods in women.  Poor nutrition.  Long-lasting (chronic) kidney, thyroid, and liver disease.  Bone marrow disorders.  Cancer and treatments for cancer.  HIV (human immunodeficiency virus) and AIDS (acquired immunodeficiency syndrome).  Treatments for HIV and AIDS.  Spleen problems.  Blood disorders.  Infections, medicines, and autoimmune disorders that destroy red blood cells. What are the signs or symptoms? Symptoms of this condition include:  Minor weakness.  Dizziness.  Headache.  Feeling heartbeats that are irregular or faster than normal  (palpitations).  Shortness of breath, especially with exercise.  Paleness.  Cold sensitivity.  Indigestion.  Nausea.  Difficulty sleeping.  Difficulty concentrating. Symptoms may occur suddenly or develop slowly. If your anemia is mild, you may not have symptoms. How is this diagnosed? This condition is diagnosed based on:  Blood tests.  Your medical history.  A physical exam.  Bone marrow biopsy. Your health care provider may also check your stool (feces) for blood and may do additional testing to look for the cause of your bleeding. You may also have other tests, including:  Imaging tests, such as a CT scan or MRI.  Endoscopy.  Colonoscopy. How is this treated? Treatment for this condition depends on the cause. If you continue to lose a lot of blood, you may need to be treated at a hospital. Treatment may include:  Taking supplements of iron, vitamin Q76, or folic acid.  Taking a hormone medicine (erythropoietin) that can help to stimulate red blood cell growth.  Having a blood transfusion. This may be needed if you lose a lot of blood.  Making changes to your diet.  Having surgery to remove your spleen. Follow these instructions at home:  Take over-the-counter and prescription medicines only as told by your health care provider.  Take supplements only as told by your health care provider.  Follow any diet instructions that you were given.  Keep all follow-up visits as told by your health care provider. This is important. Contact a health care provider if:  You develop new bleeding anywhere in the body. Get help right away if:  You are very weak.  You  are short of breath.  You have pain in your abdomen or chest.  You are dizzy or feel faint.  You have trouble concentrating.  You have bloody or black, tarry stools.  You vomit repeatedly or you vomit up blood. Summary  Anemia is a condition in which you do not have enough red blood cells or  enough of a substance in your red blood cells that carries oxygen (hemoglobin).  Symptoms may occur suddenly or develop slowly.  If your anemia is mild, you may not have symptoms.  This condition is diagnosed with blood tests as well as a medical history and physical exam. Other tests may be needed.  Treatment for this condition depends on the cause of the anemia. This information is not intended to replace advice given to you by your health care provider. Make sure you discuss any questions you have with your health care provider. Document Revised: 11/08/2017 Document Reviewed: 12/28/2016 Elsevier Patient Education  [place].

## 2020-10-13 ENCOUNTER — Telehealth: Payer: Self-pay | Admitting: Registered Nurse

## 2020-10-14 ENCOUNTER — Encounter: Payer: Self-pay | Admitting: Registered Nurse

## 2020-10-14 DIAGNOSIS — E7849 Other hyperlipidemia: Secondary | ICD-10-CM | POA: Insufficient documentation

## 2020-10-14 NOTE — Telephone Encounter (Signed)
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  Patient reported today feeling a little more short of breath with exertion today and had to take off CPAP last night due to headache.  Discussed with patient cold front moved into area, windy and raining today.  Barometric pressure change and he noticed with sinus symptoms.  Continue his allergy medications.  Hasn't used nasal saline that often in past 24 hours increase frequency again.  He has bottle at his desk.  His sp02 monitor at desk showing 96% RA (typically reads 1-2% less than clinic machine).  Speech clear, full sentences without difficulty; no cough/throat/nasal clearing.  Lower extremity edema a little worse day left greater than right pitting edema 1-2+/4 wearing ankle socks and both sock lines noted 1330 today.  Patient has appt with [facility] tomorrow.  He is going to discuss with PCM if chest xray can be done.  Patient also wondering if BP medication that causes leg swelling can be changed.  I discussed with patient PCM/cardiology the best providers to discuss changing BP med regimen as it has taken a long time to get him on right regimen to keep BP at goal.  Patient verbalized understanding information/instructions, agreed with plan of care and had no further questions at this time.

## 2020-10-18 ENCOUNTER — Ambulatory Visit: Payer: Self-pay | Admitting: Registered Nurse

## 2020-10-18 ENCOUNTER — Other Ambulatory Visit: Payer: Self-pay

## 2020-10-18 ENCOUNTER — Encounter: Payer: Self-pay | Admitting: Registered Nurse

## 2020-10-18 VITALS — BP 143/86 | HR 66 | Temp 98.8°F

## 2020-10-18 DIAGNOSIS — Z1211 Encounter for screening for malignant neoplasm of colon: Secondary | ICD-10-CM

## 2020-10-18 DIAGNOSIS — J019 Acute sinusitis, unspecified: Secondary | ICD-10-CM

## 2020-10-18 DIAGNOSIS — R0600 Dyspnea, unspecified: Secondary | ICD-10-CM

## 2020-10-18 DIAGNOSIS — R0609 Other forms of dyspnea: Secondary | ICD-10-CM

## 2020-10-18 DIAGNOSIS — L659 Nonscarring hair loss, unspecified: Secondary | ICD-10-CM

## 2020-10-18 MED ORDER — PREDNISONE 10 MG PO TABS
30.0000 mg | ORAL_TABLET | Freq: Every day | ORAL | 0 refills | Status: AC
Start: 2020-10-18 — End: 2020-10-20

## 2020-10-18 NOTE — Patient Instructions (Signed)
Shortness of Breath, Adult Shortness of breath is when a person has trouble breathing enough air or when a person feels like she or he is having trouble breathing in enough air. Shortness of breath could be a sign of a medical problem. Follow these instructions at home:   Pay attention to any changes in your symptoms.  Do not use any products that contain nicotine or tobacco, such as cigarettes, e-cigarettes, and chewing tobacco.  Do not smoke. Smoking is a common cause of shortness of breath. If you need help quitting, ask your health care provider.  Avoid things that can irritate your airways, such as: ? Mold. ? Dust. ? Air pollution. ? Chemical fumes. ? Things that can cause allergy symptoms (allergens), if you have allergies.  Keep your living space clean and free of mold and dust.  Rest as needed. Slowly return to your usual activities.  Take over-the-counter and prescription medicines only as told by your health care provider. This includes oxygen therapy and inhaled medicines.  Keep all follow-up visits as told by your health care provider. This is important. Contact a health care provider if:  Your condition does not improve as soon as expected.  You have a hard time doing your normal activities, even after you rest.  You have new symptoms. Get help right away if:  Your shortness of breath gets worse.  You have shortness of breath when you are resting.  You feel light-headed or you faint.  You have a cough that is not controlled with medicines.  You cough up blood.  You have pain with breathing.  You have pain in your chest, arms, shoulders, or abdomen.  You have a fever.  You cannot walk up stairs or exercise the way that you normally do. These symptoms may represent a serious problem that is an emergency. Do not wait to see if the symptoms will go away. Get medical help right away. Call your local emergency services (911 in the [place] Do not drive yourself  to the hospital. Summary  Shortness of breath is when a person has trouble breathing enough air. It can be a sign of a medical problem.  Avoid things that irritate your lungs, such as smoking, pollution, mold, and dust.  Pay attention to changes in your symptoms and contact your health care provider if you have a hard time completing daily activities because of shortness of breath. This information is not intended to replace advice given to you by your health care provider. Make sure you discuss any questions you have with your health care provider. Document Revised: 04/28/2018 Document Reviewed: 04/28/2018 Elsevier Patient Education  2020 [employer]. How to Use a Metered Dose Inhaler A metered dose inhaler is a handheld device for taking medicine that must be breathed into the lungs (inhaled). The device can be used to deliver a variety of inhaled medicines, including:  Quick relief or rescue medicines, such as bronchodilators.  Controller medicines, such as corticosteroids. The medicine is delivered by pushing down on a metal canister to release a preset amount of spray and medicine. Each device contains the amount of medicine that is needed for a preset number of uses (inhalations). Your health care provider may recommend that you use a spacer with your inhaler to help you take the medicine more effectively. A spacer is a plastic tube with a mouthpiece on one end and an opening that connects to the inhaler on the other end. A spacer holds the medicine in a  tube for a short time, which allows you to inhale more medicine. What are the risks? If you do not use your inhaler correctly, medicine might not reach your lungs to help you breathe. Inhaler medicine can cause side effects, such as:  Mouth or throat infection.  Cough.  Hoarseness.  Headache.  Nausea and vomiting.  Lung infection (pneumonia) in people who have a lung condition called COPD. How to use a metered dose inhaler  without a spacer  1. Remove the cap from the inhaler. 2. If you are using the inhaler for the first time, shake it for 5 seconds, turn it away from your face, then release 4 puffs into the air. This is called priming. 3. Shake the inhaler for 5 seconds. 4. Position the inhaler so the top of the canister faces up. 5. Put your index finger on the top of the medicine canister. Support the bottom of the inhaler with your thumb. 6. Breathe out normally and as completely as possible, away from the inhaler. 7. Either place the inhaler between your teeth and close your lips tightly around the mouthpiece, or hold the inhaler 1-2 inches (2.5-5 cm) away from your open mouth. Keep your tongue down out of the way. If you are unsure which technique to use, ask your health care provider. 8. Press the canister down with your index finger to release the medicine, then inhale deeply and slowly through your mouth (not your nose) until your lungs are completely filled. Inhaling should take 4-6 seconds. 9. Hold the medicine in your lungs for 5-10 seconds (10 seconds is best). This helps the medicine get into the small airways of your lungs. 10. With your lips in a tight circle (pursed), breathe out slowly. 11. Repeat steps 3-10 until you have taken the number of puffs that your health care provider directed. Wait about 1 minute between puffs or as directed. 12. Put the cap on the inhaler. 13. If you are using a steroid inhaler, rinse your mouth with water, gargle, and spit out the water. Do not swallow the water. How to use a metered dose inhaler with a spacer  1. Remove the cap from the inhaler. 2. If you are using the inhaler for the first time, shake it for 5 seconds, turn it away from your face, then release 4 puffs into the air. This is called priming. 3. Shake the inhaler for 5 seconds. 4. Place the open end of the spacer onto the inhaler mouthpiece. 5. Position the inhaler so the top of the canister faces up  and the spacer mouthpiece faces you. 6. Put your index finger on the top of the medicine canister. Support the bottom of the inhaler and the spacer with your thumb. 7. Breathe out normally and as completely as possible, away from the spacer. 8. Place the spacer between your teeth and close your lips tightly around it. Keep your tongue down out of the way. 9. Press the canister down with your index finger to release the medicine, then inhale deeply and slowly through your mouth (not your nose) until your lungs are completely filled. Inhaling should take 4-6 seconds. 10. Hold the medicine in your lungs for 5-10 seconds (10 seconds is best). This helps the medicine get into the small airways of your lungs. 11. With your lips in a tight circle (pursed), breathe out slowly. 12. Repeat steps 3-11 until you have taken the number of puffs that your health care provider directed. Wait about 1 minute between  puffs or as directed. 13. Remove the spacer from the inhaler and put the cap on the inhaler. 14. If you are using a steroid inhaler, rinse your mouth with water, gargle, and spit out the water. Do not swallow the water. Follow these instructions at home:  Take your inhaled medicine only as told by your health care provider. Do not use the inhaler more than directed by your health care provider.  Keep all follow-up visits as told by your health care provider. This is important.  If your inhaler has a counter, you can check it to determine how full your inhaler is. If your inhaler does not have a counter, ask your health care provider when you will need to refill your inhaler and write the refill date on a calendar or on your inhaler canister. Note that you cannot know when an inhaler is empty by shaking it.  Follow directions on the package insert for care and cleaning of your inhaler and spacer. Contact a health care provider if:  Symptoms are only partially relieved with your inhaler.  You are  having trouble using your inhaler.  You have an increase in phlegm.  You have headaches. Get help right away if:  You feel little or no relief after using your inhaler.  You have dizziness.  You have a fast heart rate.  You have chills or a fever.  You have night sweats.  There is blood in your phlegm. Summary  A metered dose inhaler is a handheld device for taking medicine that must be breathed into the lungs (inhaled).  The medicine is delivered by pushing down on a metal canister to release a preset amount of spray and medicine.  Each device contains the amount of medicine that is needed for a preset number of uses (inhalations). This information is not intended to replace advice given to you by your health care provider. Make sure you discuss any questions you have with your health care provider. Document Revised: 11/08/2017 Document Reviewed: 10/16/2016 Elsevier Patient Education  2020 [employer] Acute Bronchitis, Adult  Acute bronchitis is sudden or acute swelling of the air tubes (bronchi) in the lungs. Acute bronchitis causes these tubes to fill with mucus, which can make it hard to breathe. It can also cause coughing or wheezing. In adults, acute bronchitis usually goes away within 2 weeks. A cough caused by bronchitis may last up to 3 weeks. Smoking, allergies, and asthma can make the condition worse. What are the causes? This condition can be caused by germs and by substances that irritate the lungs, including:  Cold and flu viruses. The most common cause of this condition is the virus that causes the common cold.  Bacteria.  Substances that irritate the lungs, including: ? Smoke from cigarettes and other forms of tobacco. ? Dust and pollen. ? Fumes from chemical products, gases, or burned fuel. ? Other materials that pollute indoor or outdoor air.  Close contact with someone who has acute bronchitis. What increases the risk? The following factors may  make you more likely to develop this condition:  A weak body's defense system, also called the immune system.  A condition that affects your lungs and breathing, such as asthma. What are the signs or symptoms? Common symptoms of this condition include:  Lung and breathing problems, such as: ? Coughing. This may bring up clear, yellow, or green mucus from your lungs (sputum). ? Wheezing. ? Having too much mucus in your lungs (chest congestion). ? Having  shortness of breath.  A fever.  Chills.  Aches and pains, including: ? Tightness in your chest and other body aches. ? A sore throat. How is this diagnosed? This condition is usually diagnosed based on:  Your symptoms and medical history.  A physical exam. You may also have other tests, including tests to rule out other conditions, such pneumonia. These tests include:  A test of lung function.  Test of a mucus sample to look for the presence of bacteria.  Tests to check the oxygen level in your blood.  Blood tests.  Chest X-ray. How is this treated? Most cases of acute bronchitis clear up over time without treatment. Your health care provider may recommend:  Drinking more fluids. This can thin your mucus, which may improve your breathing.  Taking a medicine for a fever or cough.  Using a device that gets medicine into your lungs (inhaler) to help improve breathing and control coughing.  Using a vaporizer or a humidifier. These are machines that add water to the air to help you breathe better. Follow these instructions at home: Activity  Get plenty of rest.  Return to your normal activities as told by your health care provider. Ask your health care provider what activities are safe for you. Lifestyle  Drink enough fluid to keep your urine pale yellow.  Do not drink alcohol.  Do not use any products that contain nicotine or tobacco, such as cigarettes, e-cigarettes, and chewing tobacco. If you need help  quitting, ask your health care provider. Be aware that: ? Your bronchitis will get worse if you smoke or breathe in other people's smoke (secondhand smoke). ? Your lungs will heal faster if you quit smoking. General instructions   Take over-the-counter and prescription medicines only as told by your health care provider.  Use an inhaler, vaporizer, or humidifier as told by your health care provider.  If you have a sore throat, gargle with a salt-water mixture 3-4 times a day or as needed. To make a salt-water mixture, completely dissolve -1 tsp (3-6 g) of salt in 1 cup (237 mL) of warm water.  Keep all follow-up visits as told by your health care provider. This is important. How is this prevented? To lower your risk of getting this condition again:  Wash your hands often with soap and water. If soap and water are not available, use hand sanitizer.  Avoid contact with people who have cold symptoms.  Try not to touch your mouth, nose, or eyes with your hands.  Avoid places where there are fumes from chemicals. Breathing these fumes will make your condition worse.  Get the flu shot every year. Contact a health care provider if:  Your symptoms do not improve after 2 weeks of treatment.  You vomit more than once or twice.  You have symptoms of dehydration such as: ? Dark urine. ? Dry skin or eyes. ? Increased thirst. ? Headaches. ? Confusion. ? Muscle cramps. Get help right away if you:  Cough up blood.  Feel pain in your chest.  Have severe shortness of breath.  Faint or keep feeling like you are going to faint.  Have a severe headache.  Have fever or chills that get worse. These symptoms may represent a serious problem that is an emergency. Do not wait to see if the symptoms will go away. Get medical help right away. Call your local emergency services (911 in the [place] Do not drive yourself to the hospital. Summary  Acute  bronchitis is sudden (acute) inflammation  of the air tubes (bronchi) between the windpipe and the lungs. In adults, acute bronchitis usually goes away within 2 weeks, although coughing may last 3 weeks or longer  Take over-the-counter and prescription medicines only as told by your health care provider.  Drink enough fluid to keep your urine pale yellow.  Contact a health care provider if your symptoms do not improve after 2 weeks of treatment.  Get help right away if you cough up blood, faint, or have chest pain or shortness of breath. This information is not intended to replace advice given to you by your health care provider. Make sure you discuss any questions you have with your health care provider. Document Revised: 08/10/2019 Document Reviewed: 06/19/2019 Elsevier Patient Education  2020 [employer].

## 2020-10-18 NOTE — Progress Notes (Signed)
Subjective:    Patient ID: Ryan Glass, male    DOB: April 05, 1956, 64 y.o.   MRN: [id number]  64y/o hispanic married male here for re-evaluation seen last week for bronchitis/sinusitis 11/2 and was feeling better but worsening again today.  Coworkers telling him he looks a little pale today.  Has not been using albuterol inhaler but still taking singulair and using flonase, nasal saline.  Some frontal headache, denied cough.  Shortness of breath with exertion at work (works in Systems analyst, chemicals) [employer].  Denied fever/chills/hemoptysis/chest pain/n/v/d.  Home sp02 monitor reading a little lower this week again 95% RA.  Saw PCM on Friday was referred to long covid clinic appt but still awaiting scheduling was told they would call him and GI referral received for colon cancer screening and provider office stated unable to complete until Feb 2022.  Patient due Nov for follow up polyps and doesn't want to wait until Feb.  Patient noticed he has been losing more hair recently.  PCM deferred chest xray also per patient.     Review of Systems  Constitutional: Positive for fatigue. Negative for activity change, appetite change, chills, diaphoresis and fever.  HENT: Positive for congestion, postnasal drip, sinus pressure and sinus pain. Negative for drooling, ear discharge, ear pain, facial swelling, hearing loss, mouth sores, nosebleeds, rhinorrhea, sneezing, sore throat, tinnitus, trouble swallowing and voice change.   Eyes: Negative for photophobia and visual disturbance.  Respiratory: Positive for shortness of breath. Negative for cough, choking, wheezing and stridor.   Cardiovascular: Positive for leg swelling. Negative for chest pain and palpitations.  Gastrointestinal: Negative for diarrhea, nausea and vomiting.  Endocrine: Negative for cold intolerance and heat intolerance.  Genitourinary: Negative for difficulty urinating.  Musculoskeletal: Positive for arthralgias, back pain  and myalgias. Negative for gait problem, neck pain and neck stiffness.  Allergic/Immunologic: Positive for environmental allergies and immunocompromised state. Negative for food allergies.  Neurological: Positive for dizziness, weakness, light-headedness and headaches. Negative for tremors, seizures, syncope, facial asymmetry, speech difficulty and numbness.  Hematological: Negative for adenopathy. Does not bruise/bleed easily.  Psychiatric/Behavioral: Negative for agitation, confusion and sleep disturbance.       Objective:   Physical Exam Vitals and nursing note reviewed.  Constitutional:      General: He is awake. He is not in acute distress.    Appearance: Normal appearance. He is well-developed and well-groomed. He is not ill-appearing, toxic-appearing or diaphoretic.  HENT:     Head: Normocephalic and atraumatic.     Jaw: There is normal jaw occlusion.     Salivary Glands: Right salivary gland is not diffusely enlarged. Left salivary gland is not diffusely enlarged.     Right Ear: Hearing and external ear normal.     Left Ear: Hearing and external ear normal.     Nose: Nose normal. No congestion or rhinorrhea.     Mouth/Throat:     Lips: Pink. No lesions.     Mouth: Mucous membranes are moist. No injury, lacerations, oral lesions or angioedema.     Pharynx: Oropharynx is clear.  Eyes:     General: Lids are normal. Vision grossly intact. Gaze aligned appropriately. Allergic shiner present. No visual field deficit or scleral icterus.       Right eye: No discharge.        Left eye: No discharge.     Extraocular Movements: Extraocular movements intact.     Conjunctiva/sclera: Conjunctivae normal.     Right eye: Right conjunctiva  is not injected. No chemosis, exudate or hemorrhage.    Left eye: Left conjunctiva is not injected. No chemosis, exudate or hemorrhage.    Pupils: Pupils are equal, round, and reactive to light.  Neck:     Trachea: Trachea and phonation normal. No  tracheal tenderness or tracheal deviation.  Cardiovascular:     Rate and Rhythm: Normal rate and regular rhythm.     Pulses: Normal pulses.          Radial pulses are 2+ on the right side and 2+ on the left side.     Heart sounds: Normal heart sounds. No murmur heard.  No friction rub. No gallop.      Comments: Trace bilateral lower extremity edema nonpitting Pulmonary:     Effort: Tachypnea present. No respiratory distress.     Breath sounds: Normal air entry. No stridor or transmitted upper airway sounds. Examination of the right-middle field reveals decreased breath sounds. Examination of the left-middle field reveals decreased breath sounds. Examination of the right-lower field reveals decreased breath sounds. Examination of the left-lower field reveals decreased breath sounds. Decreased breath sounds present. No wheezing, rhonchi or rales.     Comments: Speaking short phrases/tachypnea on arrival to clinic after walking across warehouse to clinic but sitting in chair tachypnea resolved RR 16 and spoke full sentences without difficulty within 5 minutes of sitting skin warm dry and pink; wearing disposable face mask due to covid pandemic; no cough/throat clearing/nasal sniffing noted in exam room Abdominal:     General: Abdomen is flat.     Palpations: Abdomen is soft.  Musculoskeletal:        General: No swelling, tenderness, deformity or signs of injury. Normal range of motion.     Right shoulder: Normal.     Left shoulder: Normal.     Right elbow: Normal.     Left elbow: Normal.     Right hand: Normal.     Left hand: Normal.     Cervical back: Normal range of motion and neck supple. No swelling, edema, deformity, erythema, signs of trauma, lacerations, rigidity, torticollis or crepitus. No pain with movement. Normal range of motion.     Thoracic back: No swelling, edema, deformity, signs of trauma or lacerations.     Lumbar back: No swelling, edema, deformity, signs of trauma or  lacerations.     Right lower leg: Edema present.     Left lower leg: Edema present.  Lymphadenopathy:     Cervical: No cervical adenopathy.     Right cervical: No superficial cervical adenopathy.    Left cervical: No superficial cervical adenopathy.  Skin:    General: Skin is warm and dry.     Capillary Refill: Capillary refill takes less than 2 seconds.     Coloration: Skin is not ashen, cyanotic, jaundiced, mottled, pale or sallow.     Findings: No abrasion, abscess, acne, bruising, burn, ecchymosis, erythema, signs of injury, laceration, lesion, petechiae, rash or wound.     Nails: There is no clubbing.  Neurological:     General: No focal deficit present.     Mental Status: He is alert and oriented to person, place, and time. Mental status is at baseline.     GCS: GCS eye subscore is 4. GCS verbal subscore is 5. GCS motor subscore is 6.     Cranial Nerves: Cranial nerves are intact. No cranial nerve deficit, dysarthria or facial asymmetry.     Sensory: Sensation is intact. No sensory  deficit.     Motor: Motor function is intact. No weakness, tremor, atrophy, abnormal muscle tone or seizure activity.     Coordination: Coordination is intact. Coordination normal.     Gait: Gait is intact. Gait normal.     Comments: Gait sure and steady; in/out of chair without difficulty; bilateral hand grasp equal 5/5  Psychiatric:        Attention and Perception: Attention and perception normal.        Mood and Affect: Mood and affect normal.        Speech: Speech normal.        Behavior: Behavior normal. Behavior is cooperative.        Thought Content: Thought content normal.        Cognition and Memory: Cognition and memory normal.        Judgment: Judgment normal.    Patient seen in his workcenter at 1400 was eating lunch/taking break.  sp02 RA 90-95% using his monitor during conversation full sentences. RR16-18  HR stable 71-75.  Patient sitting at his desk.  Feeling a little better after  albuterol MDI 2puffs.  "My oxygen better when I am active versus sitting at my desk and I do feel better working"  Discussed for him to take 30mg  prednisone tomorrow, use albuterol on regular schedule q6h until I see him again Thursday 11/11.  ER precautions as previously discussed.  Patient verbalized understanding information/instructions, agreed with plan of care and had no further questions at this time.       Assessment & Plan:  A-dyspnea with exertion, acute rhinosinusitis, colon cancer screening  Hasn't used albuterol inhaler today use now and recommended every 6 hours on schedule if DOE/shortness of breath not resolving with rest break; increase prednisone to 30mg  x 2 days (previously dispensed 10mg  tabs from [facility] for his daily 10mg  prednisone dosing for rheumatoid arthritis)  sp02 stable from last week but not at his 98% baseline.  [place] today and weather change to 80 degrees from 40-50s last week and still freezing at night.   labs BMET WNL last week.  Lower leg swelling improved today.  Continue albuterol MDI 10-05-2000 1-2 puffs po q4-6h prn SOB/wheeze  Had course of keflex 500mg  po BID x 10 days started on 28 Oct finished. RxAlbuterol possibleside effectsincreased heart rate, hand tremors.Patient has home sp02 monitor and BP machine and keeps log. Encouraged him to continue daily check/log of both and notify clinic staff if decreasing more especially if below 90 and does not improve with rest/albuterol. Must see provider same day if staying below 90 e.g. PCM/UC/ER. Discussed I am not in clinic again tomorrow return on 11 Nov.  I will call him tomorrow to follow up. 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  DDx long covid sequelae, pneumonia, CHF, PE. Bronchitis simple, community acquired, may have started as viral (probably respiratory syncytial, parainfluenza, influenza, or adenovirus), but  now evidence of acute purulent bronchitis with resultant bronchial edema and mucus formation. Viruses are the most common cause of bronchial inflammation in otherwise healthy adults with acute bronchitis. The appearance of sputum is not predictive of whether a bacterial infection is present. Purulent sputum is most often caused by viral infections. There are a small portion of those caused by non-viral agents being Mycoplama pneumonia. Microscopic examination or C&S of sputum in the healthy adult with acute bronchitis is generally not  helpful (usually negative or normal respiratory flora) other considerations being cough from upper respiratory tract infections, sinusitis or allergic syndromes (mild asthma or viral pneumonia). Differential Diagnoses: reactive airway disease (asthma, allergic aspergillosis (eosinophilia), chronic bronchitis, respiratory infection (sinusitis, common cold, pneumonia), congestive heart failure, reflux esophagitis, bronchogenic tumor, aspiration syndromes and/or exposure to pulmonary irritants/smoke. Without high fever, severe dyspnea, lack of physical findings or other risk factors, I will hold on a chest radiograph and CBC at this time. Discuss chest xray with PCM if still feeling dyspnea with exertion as history covid this year and lung inflammation has been noted post covid infection.  Symptoms improving and sp02 back to baseline so holding on imaging today.  Exitcare handoutson shortness of breath, PE, bronchitis and inhaler use. ER if hemopthysis, SOB and sp02 not improving to over 90 with rest/albuterol, worst chest pain of life. Patient instructed to follow up prn if symptoms worsen. Patient verbalized agreement and understanding of treatment planand had no further questions at this timeP2: hand washing and cover cough   Continueflonase 1 spray each nostril BID, patient not using nasal saline as frequently as one week ago--restart nasal saline 2 sprays each  nostril q2h wa prn congestionContinue singulair 10mg  po QHS and claritin 10mg  po daily.Denied personal or family history of ENT cancer. Shower BID especially prior to bed. No evidence of systemic bacterial infection, non toxic and well hydrated. I do not see where any further testing or imaging is necessary at this time. I will suggest supportive care, rest, good hygiene and encourage the patient to take adequate fluids. The patient is to return to clinic or EMERGENCY ROOM if symptoms worsen or change significantly. Patient verbalized agreement and understanding of treatment plan and had no further questions at this time.  P2: Hand washing and cover cough  Patient given listing of [facility] Providers that are network to review.  Discussed [place] typically not taking longer than 1 month for scheduling colon cancer screenings at this time.  Anemia could be related to chronic disease (HTN, obesity, arthritis), methotrexate use, post covid sequelae as new this year but can not rule out recurrent polyps colon/occult blood loss.  Improving CBC results last week but patient still fatigued.  Recent sinusitis/bronchitis could be exacerbating symptoms as was not tolerating CPAP either/decreased quality of sleep in past week. Patient reported colon cancer screening repeat due this fall encouraged follow up with his GI provider and to discuss with his rheumatology provider (methotrexate Rx.).   Patient verbalized understanding information/instructions, agreed with plan of care and had no further questions at this time.  Hair loss could be due to post covid sequelae, methotrexate use, stressors.  Notify long covid clinic provider and rheumatology at next appt.  Patient verbalized understanding information/instructions, agreed with plan of care and had no further questions at this time.

## 2020-10-18 NOTE — Telephone Encounter (Signed)
Patient seen in clinic today see encounter note 10/18/2020

## 2020-10-19 ENCOUNTER — Encounter: Payer: Self-pay | Admitting: Registered Nurse

## 2020-10-19 ENCOUNTER — Telehealth: Payer: Self-pay | Admitting: Registered Nurse

## 2020-10-19 NOTE — Telephone Encounter (Signed)
Patient contacted via telephone to follow up symptoms after starting prednisone 30mg  po qam.  Patient reported feeling a little better today less dyspnea with exertion/shortness of breath and nasal congestion.  Patient reported end of day yesterday he was having a hard time breathing through his nose/congested and he noticed he was mouth breathing later in the day due to nasal congestion, increased nasal mucous and last night had burning in chest that resolved.  Dr office called him today and scheduled his heart US/follow up for 10 Dec per patient also.  sp02 RA patient's monitor ranging 92-94% today  Spoke full sentences without difficulty resp rate 16 A&Ox3 no nasal sniffing/throat clearing or cough noted during 5 minute telephone call.  Patient to follow up in clinic tomorrow for lung sounds, spo2, BP, HR, weight check and symptom update.  Discussed with patient will dispense another bottle of prednisone 10mg  tabs from [facility] and extend his prednisone taper.  He reiterated today that sinus infection/post nasal drip cleared after keflex and this is something new "I may have caught a cold since my nose congestion/increased clear mucous."  Discussed with patient URI viral common in community at this time.  He is still less than 90 days from his covid infection and MAb infusion I do not think this is another covid reinfection.  Continue flonase, aggressive nasal saline use, singulair and albuterol MDI use.  Patient verbalized understanding information/instructions, agreed with plan of care and had no further questions at this time.  He is walk in to clinic for re-evaluation tomorrow between 11-2.

## 2020-10-20 NOTE — Telephone Encounter (Signed)
Saw pt in clinic. Lungs sounds decreased upper lobes for RN. NP reported intermittent infrequent expiratory wheeze R mid lobe. Bilateral bases CTA. O2 sat upon arrival 94%, 96% after sitting 3 minutes. BP 144/88, p77. Returning tomorrow to clinic for BLE edema check and weight check. Got pulmonology appt for 11/15/20. Provided his new bottle of prednisone dispensed from [facility] for the weekend.

## 2020-10-21 ENCOUNTER — Ambulatory Visit: Payer: Self-pay | Admitting: *Deleted

## 2020-10-21 ENCOUNTER — Other Ambulatory Visit: Payer: Self-pay

## 2020-10-21 VITALS — BP 154/94 | HR 69 | Wt 248.0 lb

## 2020-10-21 DIAGNOSIS — I10 Essential (primary) hypertension: Secondary | ICD-10-CM

## 2020-10-21 DIAGNOSIS — R0600 Dyspnea, unspecified: Secondary | ICD-10-CM

## 2020-10-21 DIAGNOSIS — R0609 Other forms of dyspnea: Secondary | ICD-10-CM

## 2020-10-21 NOTE — Telephone Encounter (Signed)
Noted patient seen briefly feeling better on 30mg  prednisone yesterday and lung sounds more airflow noted but mild intermittent wheeze left middle lobe.  Spoke full sentences upon arrival to clinic despite having walked across warehouse.  Discussed I would contact him this weekend via telephone to follow up.  Continue 30mg  prednisone dose Fri, Sat, Sun.  Patient given 21 tabs prednisone 10mg  #21 Rf0 from [facility].  Patient verbalized understanding information/instructions, agreed with plan of care and had no further questions at this time.

## 2020-10-21 NOTE — Progress Notes (Signed)
BLE edema 1+, similar to previous. Wt check 248#. Previous wt found in Epic, 251# on 08/19/20 at Rheumatology. Reports wt is pretty stable for him. At home, he fluctuates between 246-250#. Lung sounds same as yesterday. Intermittent expiratory wheeze R mid lobe. CTA otherwise. Last used Albuterol inh 0545 today. Currently 1110. Going to use it when he returns to workstation.

## 2020-10-23 ENCOUNTER — Other Ambulatory Visit: Payer: Self-pay | Admitting: Physician Assistant

## 2020-10-25 NOTE — Telephone Encounter (Signed)
Patient seen at his desk.  Reported feeling well.  Did not check his sp02 this weekend.  Wondering if he should still take 30mg /3 tabs prednisone per day.  Discussed with patient will need to taper off this week 2 for a couple days then back to his 1 (10mg  daily).  Continue checking his SP02 and if worse DOE or shortness of breath again to notify clinic staff.  Sp02 RA 97% HR 72 with patient monitor at desk.  Will follow up with patient again in 48 hours.  Reminded patient clinic closed tomorrow.  Patient verbalized understanding information/instructions, agreed with plan of care and had no further questions at this time.

## 2020-11-01 NOTE — Telephone Encounter (Signed)
Patient seen at desk reported was taking 15mg -2 5mg  and 1 10mg  for increased prednisone dose not 30mg  Rxd for acute bronchitis.  Patient did not take any extra prednisone today.  Sp02 97% RA HR 63 respirations even and unlabored room air skin warm dry and pink.  Patient reported if still having to walk across half of warehouse length still gets short of breath that resolves after he sits or stands still.  Feeling well at home.  Will follow up with patient again next week.  Discussed with patient Kardia machine cardiology office recommended he purchase is currently on sale.  Patient verbalized understanding information/instructions, agreed with plan of care and had no further questions at this time.

## 2020-11-11 ENCOUNTER — Ambulatory Visit (HOSPITAL_COMMUNITY): Payer: No Typology Code available for payment source | Attending: Cardiovascular Disease

## 2020-11-11 ENCOUNTER — Other Ambulatory Visit: Payer: Self-pay

## 2020-11-11 DIAGNOSIS — I288 Other diseases of pulmonary vessels: Secondary | ICD-10-CM | POA: Insufficient documentation

## 2020-11-11 DIAGNOSIS — I517 Cardiomegaly: Secondary | ICD-10-CM | POA: Insufficient documentation

## 2020-11-11 LAB — ECHOCARDIOGRAM COMPLETE
Area-P 1/2: 2.34 cm2
S' Lateral: 3.1 cm

## 2020-11-14 ENCOUNTER — Telehealth: Payer: Self-pay

## 2020-11-14 ENCOUNTER — Telehealth: Payer: Self-pay | Admitting: *Deleted

## 2020-11-14 DIAGNOSIS — I712 Thoracic aortic aneurysm, without rupture, unspecified: Secondary | ICD-10-CM

## 2020-11-14 DIAGNOSIS — I7121 Aneurysm of the ascending aorta, without rupture: Secondary | ICD-10-CM

## 2020-11-14 NOTE — Telephone Encounter (Signed)
Lab order for BMET placed on this pt per CT protocol. Pt will have BMET drawn prior to scheduled CT Angio of Chest Aorta on 11/29/20, as ordered by Dr. Mayford Knife.

## 2020-11-14 NOTE — Telephone Encounter (Signed)
Left message for patient's wife to call back.  

## 2020-11-14 NOTE — Telephone Encounter (Signed)
-----   Message from [name] Rival, RT sent at 11/14/2020  2:45 PM EST ----- Regarding: Lab Order Needed This PT is scheduled for a CT angio Chest for Dr Mayford Knife on 12/21. Can you please put in an order for a BUN and creatinine?  Thanks, Benna Dunks

## 2020-11-14 NOTE — Telephone Encounter (Signed)
Patient's wife calling for results. She states the patient does not remember them.

## 2020-11-14 NOTE — Telephone Encounter (Signed)
The patient has been notified of the result and verbalized understanding.  All questions (if any) were answered. Theresia Majors, RN 11/14/2020 1:06 PM  Orders placed for chest CT

## 2020-11-14 NOTE — Telephone Encounter (Signed)
-----   Message from Quintella Reichert, MD sent at 11/11/2020  7:31 PM EST ----- Echo showed normal heart function with moderately thickened heart muscle, moderate LAE, mild to moderate AV sclerosis with no stenosis, mildly dilated ascending aorta at 79mm and aortic sinuses at 4.1cm.  Repeat Chest CTA to further assess the aorta

## 2020-11-15 ENCOUNTER — Encounter: Payer: Self-pay | Admitting: Pulmonary Disease

## 2020-11-15 ENCOUNTER — Ambulatory Visit: Payer: No Typology Code available for payment source | Admitting: Pulmonary Disease

## 2020-11-15 ENCOUNTER — Other Ambulatory Visit: Payer: Self-pay

## 2020-11-15 VITALS — BP 134/86 | HR 69 | Ht 68.0 in | Wt 253.0 lb

## 2020-11-15 DIAGNOSIS — U071 COVID-19: Secondary | ICD-10-CM

## 2020-11-15 DIAGNOSIS — R0609 Other forms of dyspnea: Secondary | ICD-10-CM

## 2020-11-15 DIAGNOSIS — R0600 Dyspnea, unspecified: Secondary | ICD-10-CM | POA: Diagnosis not present

## 2020-11-15 DIAGNOSIS — R1310 Dysphagia, unspecified: Secondary | ICD-10-CM | POA: Diagnosis not present

## 2020-11-15 DIAGNOSIS — J1282 Pneumonia due to coronavirus disease 2019: Secondary | ICD-10-CM | POA: Diagnosis not present

## 2020-11-15 NOTE — Progress Notes (Signed)
Synopsis: Referred by Bernadette Hoit, MD for post-covid 19 dyspnea  Subjective:   PATIENT ID: Ryan Glass, Ryan Glass   HPI  Chief Complaint  Patient presents with  . Consult    Pt is being referred due to SOB x3 months now. Pt states it can happen at any time. Pt states he had Covid about 4 months ago and has been having problems with his breathing since.   Ryan Glass is a 64 year old male, never smoker with rheumatoid arthritis and obstructive sleep apnea on CPAP who is referred to pulmonary clinic for Post-covid 19 Pneumonia evaluation.   He continues to have exertional shortness of breath after being diagnosed with covid 19 in 06/2020. He denies cough, wheezing or chest tightness. He also complains of fatigue. He has been using albuterol as needed with some benefit. He denies lower extremity edema.   He has been on CPAP for 10+ years and received a new machine 1-2 years ago. He also reports that the pressure was increased at last visit.    Chest radiograph 07/11/20 shows low lung volumes and atelectasis as well as patchy opacity in the right infrahilar region.  Echo performed on 11/11/20 which showed normal EF and normal RV function.  Past Medical History:  Diagnosis Date  . Ascending aortic aneurysm (HCC)    46mm by echo 10-19-2019 and 43mm by Chest CTA 10/09/2019 (both in epic)  . Bilateral lower extremity edema   . Chronic gout without tophus    followed by dr Sharmon Revere    (06-08-2020  per pt last episode right knee 3 wks ago)  . CKD (chronic kidney disease), stage II    nephrology--- [facility] (10-29-2019 note in epic scanned in  media)  . Hypertension    followed by cardiology, dr t. turner   (05-14-2018 nuclear study in epic , normal perfusion with nuclear ef 61%)  . Left hydrocele   . OSA on CPAP    per pt uses every night  . Palpitations followed by dr t. Mayford Knife   06-08-2020  still feels palipations due to PVCs when  exertion but not with chest pain/ discomfort  . PVC's (premature ventricular contractions) cardiologist--- dr t. turner   Status post PVC ablation by Dr. Ladona Ridgel 2019 with recurrence of frequent PVCs/bigeminy;  prior pseudobradycardia r/t pvcs  . Rheumatoid arthritis involving multiple sites [facility])    rheumotology--- dr a. Sharmon Revere  ([facility] in [place])     Family History  Problem Relation Age of Onset  . Cardiomyopathy Mother   . Heart attack Father      Social History   Socioeconomic History  . Marital status: Married    Spouse name: Not on file  . Number of children: Not on file  . Years of education: Not on file  . Highest education level: Not on file  Occupational History  . Not on file  Tobacco Use  . Smoking status: Never Smoker  . Smokeless tobacco: Never Used  Vaping Use  . Vaping Use: Never used  Substance and Sexual Activity  . Alcohol use: Yes    Alcohol/week: 5.0 - 7.0 standard drinks    Types: 5 - 7 Cans of beer per week  . Drug use: Never  . Sexual activity: Not on file  Other Topics Concern  . Not on file  Social History Narrative  . Not on file   Social Determinants of Health   Financial Resource Strain: Not  on file  Food Insecurity: Not on file  Transportation Needs: Not on file  Physical Activity: Not on file  Stress: Not on file  Social Connections: Not on file  Intimate Partner Violence: Not on file     Allergies  Allergen Reactions  . Amlodipine Besylate Swelling    Leg swelling with 10mg  daily am dosing; decreased with BID 5mg  dosing  . Aspirin Itching     Outpatient Medications Prior to Visit  Medication Sig Dispense Refill  . acetaminophen-codeine (TYLENOL #3) 300-30 MG tablet 1-2 tablets as needed.     albuterol (VENTOLIN HFA) 108 (90 Base) MCG/ACT inhaler Inhale 2 puffs into the lungs every 4 (four) hours as needed for wheezing or shortness of breath.    amLODipine (NORVASC) 5 MG tablet Take 5 mg by mouth in the morning and at  bedtime.    Marland Kitchen atorvastatin (LIPITOR) 20 MG tablet Take 1 tablet (20 mg total) by mouth daily. (Patient taking differently: Take 20 mg by mouth at bedtime. ) 90 tablet 3  . chlorthalidone (HYGROTON) 25 MG tablet TAKE ONE TABLET BY MOUTH DAILY 90 tablet 2  . colchicine 0.6 MG tablet Take 1-2 tablets by mouth daily as needed (gout).     Marland Kitchen diclofenac (VOLTAREN) 75 MG EC tablet Take 75 mg by mouth 2 (two) times daily.    Marland Kitchen SURECLICK 50 MG/ML injection Inject 50 mg into the skin once a week.     . febuxostat (ULORIC) 40 MG tablet Take 1 tablet by mouth daily.    . fluticasone (FLONASE) 50 MCG/ACT nasal spray SPRAY ONE SPRAY IN EACH NOSTRIL TWICE DAILY (Patient taking differently: Place 1 spray into both nostrils in the morning and at bedtime. ) 16 g 4  . folic acid (FOLVITE) 1 MG tablet Take 1 tablet by mouth daily.    Marland Kitchen gabapentin (NEURONTIN) 600 MG tablet Take 600 mg by mouth 3 (three) times daily.     . hydrALAZINE (APRESOLINE) 100 MG tablet Take 1 tablet (100 mg total) by mouth 3 (three) times daily. (Patient taking differently: Take 100 mg by mouth 3 (three) times daily. ) 270 tablet 3  . KLOR-CON M20 20 MEQ tablet TAKE 2 TABLETS BY MOUTH TWO TIMES A DAY 360 tablet 2  . loratadine (CLARITIN) 10 MG tablet Take 1 tablet (10 mg total) by mouth daily. 90 tablet 3  . losartan (COZAAR) 100 MG tablet TAKE ONE TABLET BY MOUTH DAILY (Patient taking differently: Take 100 mg by mouth daily. ) 90 tablet 2  . methotrexate 2.5 MG tablet Take 15 mg by mouth once a week.    . montelukast (SINGULAIR) 10 MG tablet TAKE ONE TABLET BY MOUTH EVERY NIGHT AT BEDTIME 90 tablet 1  . nebivolol (BYSTOLIC) 5 MG tablet Take 2 tablets (10 mg total) by mouth daily. (Patient taking differently: Take 10 mg by mouth at bedtime. ) 180 tablet 3  . predniSONE (DELTASONE) 5 MG tablet Take 10 mg by mouth daily.     . sildenafil (REVATIO) 20 MG tablet Take 1 tablet by mouth daily as needed.    Elgie Collard albuterol (VENTOLIN HFA) 108 (90  Base) MCG/ACT inhaler Inhale 1-2 puffs into the lungs every 4 (four) hours as needed for wheezing or shortness of breath. 8.5 g 0  . amLODipine (NORVASC) 5 MG tablet Take 1 tablet (5 mg total) by mouth 2 (two) times daily. 90 tablet 0   No facility-administered medications prior to visit.    Review  of Systems  Constitutional: Positive for malaise/fatigue. Negative for chills, fever and weight loss.  HENT: Negative for congestion and sore throat.   Eyes: Negative.   Respiratory: Positive for shortness of breath.   Cardiovascular: Negative for chest pain, palpitations, orthopnea, claudication and leg swelling.  Gastrointestinal: Negative for abdominal pain, heartburn, nausea and vomiting.  Genitourinary: Negative.   Musculoskeletal: Negative.   Neurological: Negative for dizziness, weakness and headaches.  Endo/Heme/Allergies: Negative.   Psychiatric/Behavioral: Negative.    Objective:   Vitals:   11/15/20 1539  BP: 134/86  Pulse: 69  SpO2: 93%  Weight: 253 lb (114.8 kg)  Height: 5\' 8"  (1.727 m)     Physical Exam Constitutional:      General: He is not in acute distress.    Appearance: He is obese.  HENT:     Head: Normocephalic and atraumatic.  Eyes:     Conjunctiva/sclera: Conjunctivae normal.  Cardiovascular:     Rate and Rhythm: Normal rate and regular rhythm.     Pulses: Normal pulses.     Heart sounds: Normal heart sounds. No murmur heard.   Pulmonary:     Effort: Pulmonary effort is normal.     Breath sounds: Normal breath sounds. No wheezing, rhonchi or rales.  Abdominal:     General: Bowel sounds are normal.     Palpations: Abdomen is soft.  Musculoskeletal:     Right lower leg: No edema.     Left lower leg: No edema.  Skin:    General: Skin is warm and dry.  Neurological:     General: No focal deficit present.     Mental Status: He is alert.  Psychiatric:        Mood and Affect: Mood normal.        Behavior: Behavior normal.        Thought  Content: Thought content normal.        Judgment: Judgment normal.     CBC    Component Value Date/Time   WBC 5.3 10/10/2020 1148   WBC 4.1 07/12/2020 2230   RBC 3.95 (L) 10/10/2020 1148   RBC 3.77 (L) 07/12/2020 2230   HGB 13.8 10/10/2020 1148   HCT 39.1 10/10/2020 1148   PLT 169 10/10/2020 1148   MCV 99 (H) 10/10/2020 1148   MCH 34.9 (H) 10/10/2020 1148   MCH 34.0 07/12/2020 2230   MCHC 35.3 10/10/2020 1148   MCHC 33.8 07/12/2020 2230   RDW 14.0 10/10/2020 1148   LYMPHSABS 0.6 (L) 10/10/2020 1148   MONOABS 0.7 07/12/2020 2230   EOSABS 0.0 10/10/2020 1148   BASOSABS 0.0 10/10/2020 1148   BMP Latest Ref Rng & Units 10/06/2020 07/12/2020 06/14/2020  Glucose 65 - 99 mg/dL 97 90 08/15/2020)  BUN 8 - 27 mg/dL 19 161(W) 17  Creatinine 0.76 - 1.27 mg/dL 96(E 4.54) 0.98(J  BUN/Creat Ratio 10 - 24 18 - -  Sodium 134 - 144 mmol/L 140 134(L) 140  Potassium 3.5 - 5.2 mmol/L 3.7 3.7 3.1(L)  Chloride 96 - 106 mmol/L 104 101 101  CO2 20 - 29 mmol/L 21 23 -  Calcium 8.6 - 10.2 mg/dL 9.7 1.91) -     Chest imaging: CXR 07/12/20 There are low lung volumes with central vascular crowding and additional bandlike opacities in the periphery of the left mid lung which could reflect subsegmental atelectasis or scarring. More patchy right infrahilar opacity with airways thickening is present. No pneumothorax or visible effusion. Cardiomediastinal contours are unremarkable. No acute  osseous or soft tissue abnormality. Degenerative changes are present in the imaged spine and shoulders. Telemetry leads overlie the chest.  PFT: No flowsheet data found.  Echo: 11/11/20 1. Left ventricular ejection fraction, by estimation, is 60 to 65%. The  left ventricle has normal function. The left ventricle has no regional  wall motion abnormalities. There is moderate left ventricular hypertrophy.  Left ventricular diastolic  parameters were normal.  2. Right ventricular systolic function is normal. The right  ventricular  size is normal.  3. Left atrial size was moderately dilated.  4. Mobile fossa with no obvious PFO by color flow.  5. The mitral valve is normal in structure. Trivial mitral valve  regurgitation. No evidence of mitral stenosis.  6. The aortic valve is tricuspid. Aortic valve regurgitation is not  visualized. Mild to moderate aortic valve sclerosis/calcification is  present, without any evidence of aortic stenosis.  7. Aortic sinuses seem dilated ? 4.1 cm can consider cardiac CTA to  further assess if clinically indicated . Aortic dilatation noted. There is  mild dilatation of the ascending aorta, measuring 39 mm.  8. The inferior vena cava is normal in size with greater than 50%  respiratory variability, suggesting right atrial pressure of 3 mmHg.     Assessment & Plan:   Pneumonia due to COVID-19 virus  Dyspnea on exertion - Plan: Pulmonary Function Test  Dysphagia, unspecified type - Plan: CANCELED: SLP modified barium swallow  Discussion: Ryan Glass is a 63 year old male, never smoker with rheumatoid arthritis and obstructive sleep apnea on CPAP who is referred to pulmonary clinic for Post-covid 19 Pneumonia evaluation.   He continues to have exertional dyspnea which could be related to interstitial lung damage secondary to the covid 19 infection. We will obtain pulmonary function tests to further evaluate this. He also has rheumatoid arthritis which could lead to interstitial lung involvement. Based on any abnormalities of the pulmonary function tests we will consider obtaining a high resolution CT chest scan.   He is to start flovent for any ongoing inflammation that could be occurring from the covid pneumonia.  He does report increased coughing with eating and drinking. He would like to hold off on any barium swallow at this time.   Follow up in 3 months  Melody Comas, MD [facility] Office: [phone]   See Amion for  Pager Details     Current Outpatient Medications:  .  acetaminophen-codeine (TYLENOL #3) 300-30 MG tablet, 1-2 tablets as needed. , Disp: , Rfl:  .  albuterol (VENTOLIN HFA) 108 (90 Base) MCG/ACT inhaler, Inhale 2 puffs into the lungs every 4 (four) hours as needed for wheezing or shortness of breath., Disp: , Rfl:  .  amLODipine (NORVASC) 5 MG tablet, Take 5 mg by mouth in the morning and at bedtime., Disp: , Rfl:  .  atorvastatin (LIPITOR) 20 MG tablet, Take 1 tablet (20 mg total) by mouth daily. (Patient taking differently: Take 20 mg by mouth at bedtime. ), Disp: 90 tablet, Rfl: 3 .  chlorthalidone (HYGROTON) 25 MG tablet, TAKE ONE TABLET BY MOUTH DAILY, Disp: 90 tablet, Rfl: 2 .  colchicine 0.6 MG tablet, Take 1-2 tablets by mouth daily as needed (gout). , Disp: , Rfl:  .  diclofenac (VOLTAREN) 75 MG EC tablet, Take 75 mg by mouth 2 (two) times daily., Disp: , Rfl:  .  ENBREL SURECLICK 50 MG/ML injection, Inject 50 mg into the skin once a week. , Disp: ,  Rfl:  .  febuxostat (ULORIC) 40 MG tablet, Take 1 tablet by mouth daily., Disp: , Rfl:  .  fluticasone (FLONASE) 50 MCG/ACT nasal spray, SPRAY ONE SPRAY IN EACH NOSTRIL TWICE DAILY (Patient taking differently: Place 1 spray into both nostrils in the morning and at bedtime. ), Disp: 16 g, Rfl: 4 .  folic acid (FOLVITE) 1 MG tablet, Take 1 tablet by mouth daily., Disp: , Rfl:  .  gabapentin (NEURONTIN) 600 MG tablet, Take 600 mg by mouth 3 (three) times daily. , Disp: , Rfl:  .  hydrALAZINE (APRESOLINE) 100 MG tablet, Take 1 tablet (100 mg total) by mouth 3 (three) times daily. (Patient taking differently: Take 100 mg by mouth 3 (three) times daily. ), Disp: 270 tablet, Rfl: 3 .  KLOR-CON M20 20 MEQ tablet, TAKE 2 TABLETS BY MOUTH TWO TIMES A DAY, Disp: 360 tablet, Rfl: 2 .  loratadine (CLARITIN) 10 MG tablet, Take 1 tablet (10 mg total) by mouth daily., Disp: 90 tablet, Rfl: 3 .  losartan (COZAAR) 100 MG tablet, TAKE ONE TABLET BY MOUTH DAILY  (Patient taking differently: Take 100 mg by mouth daily. ), Disp: 90 tablet, Rfl: 2 .  methotrexate 2.5 MG tablet, Take 15 mg by mouth once a week., Disp: , Rfl:  .  montelukast (SINGULAIR) 10 MG tablet, TAKE ONE TABLET BY MOUTH EVERY NIGHT AT BEDTIME, Disp: 90 tablet, Rfl: 1 .  nebivolol (BYSTOLIC) 5 MG tablet, Take 2 tablets (10 mg total) by mouth daily. (Patient taking differently: Take 10 mg by mouth at bedtime. ), Disp: 180 tablet, Rfl: 3 .  predniSONE (DELTASONE) 5 MG tablet, Take 10 mg by mouth daily. , Disp: , Rfl:  .  sildenafil (REVATIO) 20 MG tablet, Take 1 tablet by mouth daily as needed., Disp: , Rfl:  .  albuterol (VENTOLIN HFA) 108 (90 Base) MCG/ACT inhaler, Inhale 1-2 puffs into the lungs every 4 (four) hours as needed for wheezing or shortness of breath., Disp: 8.5 g, Rfl: 0 .  amLODipine (NORVASC) 5 MG tablet, Take 1 tablet (5 mg total) by mouth 2 (two) times daily., Disp: 90 tablet, Rfl: 0

## 2020-11-15 NOTE — Patient Instructions (Signed)
We will schedule you for pulmonary function tests  Start flovent 2 puffs twice daily with spacer (rinse mouth after with water)  Continue to use albuterol as needed  We will consider scheduling you for high resolution CT chest scan in the future

## 2020-11-16 ENCOUNTER — Ambulatory Visit (INDEPENDENT_AMBULATORY_CARE_PROVIDER_SITE_OTHER): Payer: No Typology Code available for payment source | Admitting: Pulmonary Disease

## 2020-11-16 DIAGNOSIS — R0600 Dyspnea, unspecified: Secondary | ICD-10-CM | POA: Diagnosis not present

## 2020-11-16 DIAGNOSIS — R0609 Other forms of dyspnea: Secondary | ICD-10-CM

## 2020-11-16 NOTE — Progress Notes (Signed)
Full PFT completed today ? ?

## 2020-11-22 ENCOUNTER — Telehealth: Payer: Self-pay | Admitting: Pulmonary Disease

## 2020-11-22 MED ORDER — FLOVENT HFA 110 MCG/ACT IN AERO
2.0000 | INHALATION_SPRAY | Freq: Two times a day (BID) | RESPIRATORY_TRACT | 11 refills | Status: DC
Start: 2020-11-22 — End: 2020-11-28

## 2020-11-22 NOTE — Telephone Encounter (Signed)
Flovent HFA 110 was prescribed at 12/7 OV but was not sent to pharmacy.  This has been sent to pharmacy.  Pt is aware. Pt is also requesting PFT results.  Dr. Francine Graven please advise on PFT results, thanks!

## 2020-11-24 ENCOUNTER — Other Ambulatory Visit: Payer: No Typology Code available for payment source

## 2020-11-24 ENCOUNTER — Ambulatory Visit: Payer: Self-pay | Admitting: Registered Nurse

## 2020-11-24 ENCOUNTER — Telehealth: Payer: Self-pay | Admitting: Registered Nurse

## 2020-11-24 ENCOUNTER — Other Ambulatory Visit: Payer: Self-pay

## 2020-11-24 ENCOUNTER — Encounter: Payer: Self-pay | Admitting: Registered Nurse

## 2020-11-24 VITALS — BP 143/95 | HR 68 | Temp 98.8°F | Resp 20 | Ht 68.0 in | Wt 244.5 lb

## 2020-11-24 DIAGNOSIS — R0609 Other forms of dyspnea: Secondary | ICD-10-CM

## 2020-11-24 DIAGNOSIS — I10 Essential (primary) hypertension: Secondary | ICD-10-CM

## 2020-11-24 DIAGNOSIS — I712 Thoracic aortic aneurysm, without rupture, unspecified: Secondary | ICD-10-CM

## 2020-11-24 DIAGNOSIS — G9332 Myalgic encephalomyelitis/chronic fatigue syndrome: Secondary | ICD-10-CM

## 2020-11-24 DIAGNOSIS — I7121 Aneurysm of the ascending aorta, without rupture: Secondary | ICD-10-CM

## 2020-11-24 DIAGNOSIS — R0600 Dyspnea, unspecified: Secondary | ICD-10-CM

## 2020-11-24 DIAGNOSIS — U099 Post covid-19 condition, unspecified: Secondary | ICD-10-CM

## 2020-11-24 DIAGNOSIS — R5382 Chronic fatigue, unspecified: Secondary | ICD-10-CM

## 2020-11-24 LAB — PULMONARY FUNCTION TEST
DL/VA % pred: 86 %
DL/VA: 3.6 ml/min/mmHg/L
DLCO cor % pred: 71 %
DLCO cor: 18.55 ml/min/mmHg
DLCO unc % pred: 71 %
DLCO unc: 18.55 ml/min/mmHg
FEF 25-75 Post: 3.83 L/sec
FEF 25-75 Pre: 3.48 L/sec
FEF2575-%Change-Post: 10 %
FEF2575-%Pred-Post: 142 %
FEF2575-%Pred-Pre: 129 %
FEV1-%Change-Post: 0 %
FEV1-%Pred-Post: 101 %
FEV1-%Pred-Pre: 101 %
FEV1-Post: 3.4 L
FEV1-Pre: 3.38 L
FEV1FVC-%Change-Post: 3 %
FEV1FVC-%Pred-Pre: 109 %
FEV6-%Change-Post: -3 %
FEV6-%Pred-Post: 94 %
FEV6-%Pred-Pre: 97 %
FEV6-Post: 4.01 L
FEV6-Pre: 4.14 L
FEV6FVC-%Pred-Post: 105 %
FEV6FVC-%Pred-Pre: 105 %
FVC-%Change-Post: -3 %
FVC-%Pred-Post: 89 %
FVC-%Pred-Pre: 92 %
FVC-Post: 4.01 L
FVC-Pre: 4.14 L
Post FEV1/FVC ratio: 85 %
Post FEV6/FVC ratio: 100 %
Pre FEV1/FVC ratio: 82 %
Pre FEV6/FVC Ratio: 100 %
RV % pred: 121 %
RV: 2.77 L
TLC % pred: 106 %
TLC: 7.23 L

## 2020-11-24 MED ORDER — ALBUTEROL SULFATE HFA 108 (90 BASE) MCG/ACT IN AERS: 1.0000 | INHALATION_SPRAY | RESPIRATORY_TRACT | 0 refills | Status: DC | PRN

## 2020-11-24 NOTE — Progress Notes (Signed)
Subjective:    Patient ID: Ryan Glass, male    DOB: 1956/10/16, 64 y.o.   MRN: [id number]  64y/o married established male patient [place] here for evaluation fatigue, shortness of breath with exertion and joint aches.  I saw pulmonology and had breathing test they ordered fluticasone inhaler but will cost $100 can you fill a different one for me.  Discussed with patient will check with [facility] for alternatives or manufacturer/discount coupons for him and get back with him once able to research other options  Patient also stated albuterol inhaler almost empty needs refill also sent to [facility].  It does help me to breath better for a couple hours since I have been having these problems.  Could this be my heart causing shortness of breath with activity?  Denied any changes in usual leg swelling.  Denied new or worsening palpitations recently.  Pulmonology may do chest CT if symptoms not improving at follow up visit.  I had my lung test done and it showed some problems.  He has cardiology appts for testing scheduled/follow up his chronic conditions. Patient s/p covid Aug 2021 and monoclonal antibody infusion.  Fully covid vaccinated but immunocompromised due to chronic oral steroid use for rheumatoid arthritis.  Temperatures have been fluctuating between freezing at night and 60s during day.  Patient reported thermostat at home set at 42.  Denied known covid contacts or ill coworkers.  Patient reported hasn't check BP at home in a while but has been checking sp02 and it has been his normal.  Would like assistance to set up covid testing to make sure he hasn't been infected again due to worsening fatigue/arthralgias.     Review of Systems  Constitutional: Positive for fatigue. Negative for activity change, appetite change, chills, diaphoresis and fever.  HENT: Negative for trouble swallowing and voice change.   Eyes: Negative for photophobia and visual disturbance.  Respiratory: Positive for  chest tightness and shortness of breath. Negative for cough, wheezing and stridor.   Cardiovascular: Positive for leg swelling.  Gastrointestinal: Negative for diarrhea, nausea and vomiting.  Endocrine: Negative for cold intolerance and heat intolerance.  Genitourinary: Negative for difficulty urinating.  Musculoskeletal: Positive for arthralgias and myalgias. Negative for gait problem, neck pain and neck stiffness.  Skin: Negative for color change, pallor, rash and wound.  Allergic/Immunologic: Positive for environmental allergies and immunocompromised state. Negative for food allergies.  Neurological: Negative for dizziness, tremors, seizures, syncope, facial asymmetry, speech difficulty, weakness, light-headedness, numbness and headaches.  Hematological: Negative for adenopathy. Does not bruise/bleed easily.  Psychiatric/Behavioral: Negative for agitation, confusion and sleep disturbance.       Objective:   Physical Exam Vitals reviewed.  Constitutional:      General: He is awake. He is not in acute distress.Vital signs are normal.     Appearance: Normal appearance. He is well-developed, well-groomed and well-nourished. He is obese. He is ill-appearing. He is not toxic-appearing or diaphoretic.  HENT:     Head: Normocephalic and atraumatic.     Jaw: There is normal jaw occlusion.     Salivary Glands: Right salivary gland is not diffusely enlarged or tender. Left salivary gland is not diffusely enlarged or tender.     Right Ear: Hearing and external ear normal.     Left Ear: Hearing and external ear normal.     Nose: Nose normal. No congestion or rhinorrhea.     Mouth/Throat:     Pharynx: Oropharynx is clear.  Eyes:  General: Lids are normal. Vision grossly intact. Gaze aligned appropriately. Allergic shiner present. No scleral icterus.       Right eye: No discharge.        Left eye: No discharge.     Extraocular Movements: Extraocular movements intact and EOM normal.      Conjunctiva/sclera: Conjunctivae normal.     Pupils: Pupils are equal, round, and reactive to light.  Neck:     Trachea: Trachea normal.  Cardiovascular:     Rate and Rhythm: Normal rate and regular rhythm.     Pulses: Normal pulses and intact distal pulses.          Radial pulses are 2+ on the right side and 2+ on the left side.     Heart sounds: Normal heart sounds. No murmur heard. No friction rub. No gallop.   Pulmonary:     Effort: Pulmonary effort is normal. No respiratory distress.     Breath sounds: Normal breath sounds and air entry. No stridor, decreased air movement or transmitted upper airway sounds. No decreased breath sounds, wheezing, rhonchi or rales.     Comments: Wearing mask due to covid pandemic; spoke full sentences without difficulty after 5 minutes in clinic standing/resting during chart review in epic; no cough in clinic; initially presented with shortness of breath after walking from work[place] across [place] to clinic spoke in short phrases sp02 stable 97% RA Abdominal:     Palpations: Abdomen is soft.  Musculoskeletal:        General: No swelling, tenderness or signs of injury. Normal range of motion.     Right shoulder: No swelling, deformity, effusion or laceration. Normal strength.     Left shoulder: No swelling, deformity, effusion or laceration. Normal strength.     Right elbow: No swelling, deformity, effusion or lacerations.     Left elbow: No swelling, deformity, effusion or lacerations.     Right hand: No swelling or lacerations. Normal strength. Normal capillary refill. Normal pulse.     Left hand: No swelling or lacerations. Normal strength. Normal capillary refill. Normal pulse.     Cervical back: Normal range of motion and neck supple. No swelling, edema, erythema, signs of trauma, lacerations, rigidity, torticollis, tenderness or crepitus. No pain with movement. Normal range of motion.     Thoracic back: No swelling, edema, signs of trauma or  lacerations.     Lumbar back: No swelling, edema, signs of trauma or lacerations.     Right lower leg: 1+ Edema present.     Left lower leg: 2+ Pitting Edema present.  Lymphadenopathy:     Head:     Right side of head: No submental or preauricular adenopathy.     Left side of head: No submental or preauricular adenopathy.     Cervical: No cervical adenopathy.     Right cervical: No superficial cervical adenopathy.    Left cervical: No superficial cervical adenopathy.  Skin:    General: Skin is warm, dry and intact.     Capillary Refill: Capillary refill takes less than 2 seconds.     Coloration: Skin is not ashen, cyanotic, jaundiced, mottled, pale or sallow.     Findings: No abrasion, abscess, acne, bruising, burn, ecchymosis, erythema, signs of injury, laceration, lesion, petechiae, rash or wound. Rash is not crusting, macular, nodular, papular, purpuric, pustular, scaling, urticarial or vesicular.     Nails: There is no clubbing.  Neurological:     General: No focal deficit present.  Mental Status: He is alert and oriented to person, place, and time. Mental status is at baseline.     GCS: GCS eye subscore is 4. GCS verbal subscore is 5. GCS motor subscore is 6.     Cranial Nerves: Cranial nerves are intact. No cranial nerve deficit, dysarthria or facial asymmetry.     Sensory: Sensation is intact. No sensory deficit.     Motor: Motor function is intact. No weakness, tremor, atrophy, abnormal muscle tone or seizure activity.     Coordination: Coordination is intact. Coordination normal.     Gait: Gait is intact. Gait normal.     Comments: Gait sure and steady in clinic; in/out of chair without difficulty; bilateral hand grasp equal 5/5  Psychiatric:        Attention and Perception: Attention and perception normal.        Mood and Affect: Mood and affect, mood and affect normal.        Speech: Speech normal.        Behavior: Behavior normal. Behavior is cooperative.        Thought  Content: Thought content normal.        Cognition and Memory: Cognition, memory and cognition and memory normal.        Judgment: Judgment normal.      Lower eyelids swollen 1+ nonpitting, allergic shiners 1-2+pitting edema left lower extremity and right 1+ Weight 244lbs with shoes on large scale in clinic today Very winded from walking across warehouse when first arrived speaking only in short phrases     Assessment & Plan:  A-fatigue post covid, dyspnea on exertion, elevated blood pressure   P-slightly worse fatigue since covid infection Aug 2021.  May have new viral URI, still mild anemia on labs performed by pulmonology on chart review and cardiology performing follow up testing for his chronic conditions/r/o new post covid changes.  Discussed I can't just look at home and know if fatigue from long covid, cardiovascular or acute illness exacerbation.  It is probably all 3 since viral URI noncovid/nonflu have been circulating in community. Patient with RA colder weather nights could be flaring arthritis. Afebrile today.  At baseline except for elevated BP  Took all his medications today.  Keep scheduled follow up with cardiology.  Patient verbalized understanding information/instructions, agreed with plan of care and had no further questions at this time.  Running low on nasal saline.  Picked up 2 bottles nasal saline from clinic stock one for work and one for home prn use 2 sprays each nostril q2h wa.  Refilled albuterol MDI 1-2 puffs po q4-6h prn prontracted cough/wheeze/chest tightness/shortness of breath not improving with rest after exertion.  PFTs showed mild diffusion less than anticipated rate.  Keep scheduled follow up appts with pulmonology.  Patient verbalized understanding information/instructions, agreed with plan of care and had no further questions at this time.  Patient fully covid vaccinated and boosted and had mab aug 2021 due to his concerns will screen for covid  scheduled tomorrow at [address] [facility] near his home on [address].  Discussed patient to wear mask, stay in car, bring driver's license and insurance card and follow instructions from location staff.  If new or worsening symptoms to email me [email or web address] to discuss further as RN Rolly Salter may not be in clinic tomorrow due to family emergency today.  Due to omicron circulating in [place] breakthrough infection could occur but also noncovid nonflu viruses circulating in community results in fatigue/cough/GI upset.  Eat regular meals/hydrate/pace himself with activities. Exitcare handout fatigue, shortness of breath, managing hypertension. Patient verbalized understanding information/instructions, agreed with plan of care and had no further questions at this time.  Elevated blood pressure avoid caffeine this afternoon.  Ensure taking his prescribed medications every day.  ER if chest pain, worst headache of life, dyspnea or visual changes for re-evaluation.  Patient verbalized understanding information/instructions, agreed with plan of care and had no further questions at this time.

## 2020-11-24 NOTE — Patient Instructions (Addendum)
Fatigue If you have fatigue, you feel tired all the time and have a lack of energy or a lack of motivation. Fatigue may make it difficult to start or complete tasks because of exhaustion. In general, occasional or mild fatigue is often a normal response to activity or life. However, long-lasting (chronic) or extreme fatigue may be a symptom of a medical condition. Follow these instructions at home: General instructions  Watch your fatigue for any changes.  Go to bed and get up at the same time every day.  Avoid fatigue by pacing yourself during the day and getting enough sleep at night.  Maintain a healthy weight. Medicines  Take over-the-counter and prescription medicines only as told by your health care provider.  Take a multivitamin, if told by your health care provider.  Do not use herbal or dietary supplements unless they are approved by your health care provider. Activity   Exercise regularly, as told by your health care provider.  Use or practice techniques to help you relax, such as yoga, tai chi, meditation, or massage therapy. Eating and drinking   Avoid heavy meals in the evening.  Eat a well-balanced diet, which includes lean proteins, whole grains, plenty of fruits and vegetables, and low-fat dairy products.  Avoid consuming too much caffeine.  Avoid the use of alcohol.  Drink enough fluid to keep your urine pale yellow. Lifestyle  Change situations that cause you stress. Try to keep your work and personal schedule in balance.  Do not use any products that contain nicotine or tobacco, such as cigarettes and e-cigarettes. If you need help quitting, ask your health care provider.  Do not use drugs. Contact a health care provider if:  Your fatigue does not get better.  You have a fever.  You suddenly lose or gain weight.  You have headaches.  You have trouble falling asleep or sleeping through the night.  You feel angry, guilty, anxious, or  sad.  You are unable to have a bowel movement (constipation).  Your skin is dry.  You have swelling in your legs or another part of your body. Get help right away if:  You feel confused.  Your vision is blurry.  You feel faint or you pass out.  You have a severe headache.  You have severe pain in your abdomen, your back, or the area between your waist and hips (pelvis).  You have chest pain, shortness of breath, or an irregular or fast heartbeat.  You are unable to urinate, or you urinate less than normal.  You have abnormal bleeding, such as bleeding from the rectum, vagina, nose, lungs, or nipples.  You vomit blood.  You have thoughts about hurting yourself or others. If you ever feel like you may hurt yourself or others, or have thoughts about taking your own life, get help right away. You can go to your nearest emergency department or call:  Your local emergency services (911 in the [place]  A suicide crisis helpline, such as the [place] at 9164775271. This is open 24 hours a day. Summary  If you have fatigue, you feel tired all the time and have a lack of energy or a lack of motivation.  Fatigue may make it difficult to start or complete tasks because of exhaustion.  Long-lasting (chronic) or extreme fatigue may be a symptom of a medical condition.  Exercise regularly, as told by your health care provider.  Change situations that cause you stress. Try to keep your  work and personal schedule in balance. This information is not intended to replace advice given to you by your health care provider. Make sure you discuss any questions you have with your health care provider. Document Revised: 06/17/2019 Document Reviewed: 08/21/2017 Elsevier Patient Education  [place] of Breath, Adult Shortness of breath is when a person has trouble breathing enough air or when a person feels like she or he is having trouble  breathing in enough air. Shortness of breath could be a sign of a medical problem. Follow these instructions at home:   Pay attention to any changes in your symptoms.  Do not use any products that contain nicotine or tobacco, such as cigarettes, e-cigarettes, and chewing tobacco.  Do not smoke. Smoking is a common cause of shortness of breath. If you need help quitting, ask your health care provider.  Avoid things that can irritate your airways, such as: ? Mold. ? Dust. ? Air pollution. ? Chemical fumes. ? Things that can cause allergy symptoms (allergens), if you have allergies.  Keep your living space clean and free of mold and dust.  Rest as needed. Slowly return to your usual activities.  Take over-the-counter and prescription medicines only as told by your health care provider. This includes oxygen therapy and inhaled medicines.  Keep all follow-up visits as told by your health care provider. This is important. Contact a health care provider if:  Your condition does not improve as soon as expected.  You have a hard time doing your normal activities, even after you rest.  You have new symptoms. Get help right away if:  Your shortness of breath gets worse.  You have shortness of breath when you are resting.  You feel light-headed or you faint.  You have a cough that is not controlled with medicines.  You cough up blood.  You have pain with breathing.  You have pain in your chest, arms, shoulders, or abdomen.  You have a fever.  You cannot walk up stairs or exercise the way that you normally do. These symptoms may represent a serious problem that is an emergency. Do not wait to see if the symptoms will go away. Get medical help right away. Call your local emergency services (911 in the [place] Do not drive yourself to the hospital. Summary  Shortness of breath is when a person has trouble breathing enough air. It can be a sign of a medical problem.  Avoid  things that irritate your lungs, such as smoking, pollution, mold, and dust.  Pay attention to changes in your symptoms and contact your health care provider if you have a hard time completing daily activities because of shortness of breath. This information is not intended to replace advice given to you by your health care provider. Make sure you discuss any questions you have with your health care provider. Document Revised: 04/28/2018 Document Reviewed: 04/28/2018 Elsevier Patient Education  [place] Your Hypertension Hypertension is commonly called high blood pressure. This is when the force of your blood pressing against the walls of your arteries is too strong. Arteries are blood vessels that carry blood from your heart throughout your body. Hypertension forces the heart to work harder to pump blood, and may cause the arteries to become narrow or stiff. Having untreated or uncontrolled hypertension can cause heart attack, stroke, kidney disease, and other problems. What are blood pressure readings? A blood pressure reading consists of a higher number over a lower number.  Ideally, your blood pressure should be below 120/80. The first ("top") number is called the systolic pressure. It is a measure of the pressure in your arteries as your heart beats. The second ("bottom") number is called the diastolic pressure. It is a measure of the pressure in your arteries as the heart relaxes. What does my blood pressure reading mean? Blood pressure is classified into four stages. Based on your blood pressure reading, your health care provider may use the following stages to determine what type of treatment you need, if any. Systolic pressure and diastolic pressure are measured in a unit called mm Hg. Normal  Systolic pressure: below 120.  Diastolic pressure: below 80. Elevated  Systolic pressure: 120-129.  Diastolic pressure: below 80. Hypertension stage 1  Systolic pressure:  130-139.  Diastolic pressure: 80-89. Hypertension stage 2  Systolic pressure: 140 or above.  Diastolic pressure: 90 or above. What health risks are associated with hypertension? Managing your hypertension is an important responsibility. Uncontrolled hypertension can lead to:  A heart attack.  A stroke.  A weakened blood vessel (aneurysm).  Heart failure.  Kidney damage.  Eye damage.  Metabolic syndrome.  Memory and concentration problems. What changes can I make to manage my hypertension? Hypertension can be managed by making lifestyle changes and possibly by taking medicines. Your health care provider will help you make a plan to bring your blood pressure within a normal range. Eating and drinking   Eat a diet that is high in fiber and potassium, and low in salt (sodium), added sugar, and fat. An example eating plan is called the DASH (Dietary Approaches to Stop Hypertension) diet. To eat this way: ? Eat plenty of fresh fruits and vegetables. Try to fill half of your plate at each meal with fruits and vegetables. ? Eat whole grains, such as whole wheat pasta, brown rice, or whole grain bread. Fill about one quarter of your plate with whole grains. ? Eat low-fat diary products. ? Avoid fatty cuts of meat, processed or cured meats, and poultry with skin. Fill about one quarter of your plate with lean proteins such as fish, chicken without skin, beans, eggs, and tofu. ? Avoid premade and processed foods. These tend to be higher in sodium, added sugar, and fat.  Reduce your daily sodium intake. Most people with hypertension should eat less than 1,500 mg of sodium a day.  Limit alcohol intake to no more than 1 drink a day for nonpregnant women and 2 drinks a day for men. One drink equals 12 oz of beer, 5 oz of wine, or 1 oz of hard liquor. Lifestyle  Work with your health care provider to maintain a healthy body weight, or to lose weight. Ask what an ideal weight is for  you.  Get at least 30 minutes of exercise that causes your heart to beat faster (aerobic exercise) most days of the week. Activities may include walking, swimming, or biking.  Include exercise to strengthen your muscles (resistance exercise), such as weight lifting, as part of your weekly exercise routine. Try to do these types of exercises for 30 minutes at least 3 days a week.  Do not use any products that contain nicotine or tobacco, such as cigarettes and e-cigarettes. If you need help quitting, ask your health care provider.  Control any long-term (chronic) conditions you have, such as high cholesterol or diabetes. Monitoring  Monitor your blood pressure at home as told by your health care provider. Your personal target blood pressure  may vary depending on your medical conditions, your age, and other factors.  Have your blood pressure checked regularly, as often as told by your health care provider. Working with your health care provider  Review all the medicines you take with your health care provider because there may be side effects or interactions.  Talk with your health care provider about your diet, exercise habits, and other lifestyle factors that may be contributing to hypertension.  Visit your health care provider regularly. Your health care provider can help you create and adjust your plan for managing hypertension. Will I need medicine to control my blood pressure? Your health care provider may prescribe medicine if lifestyle changes are not enough to get your blood pressure under control, and if:  Your systolic blood pressure is 130 or higher.  Your diastolic blood pressure is 80 or higher. Take medicines only as told by your health care provider. Follow the directions carefully. Blood pressure medicines must be taken as prescribed. The medicine does not work as well when you skip doses. Skipping doses also puts you at risk for problems. Contact a health care provider  if:  You think you are having a reaction to medicines you have taken.  You have repeated (recurrent) headaches.  You feel dizzy.  You have swelling in your ankles.  You have trouble with your vision. Get help right away if:  You develop a severe headache or confusion.  You have unusual weakness or numbness, or you feel faint.  You have severe pain in your chest or abdomen.  You vomit repeatedly.  You have trouble breathing. Summary  Hypertension is when the force of blood pumping through your arteries is too strong. If this condition is not controlled, it may put you at risk for serious complications.  Your personal target blood pressure may vary depending on your medical conditions, your age, and other factors. For most people, a normal blood pressure is less than 120/80.  Hypertension is managed by lifestyle changes, medicines, or both. Lifestyle changes include weight loss, eating a healthy, low-sodium diet, exercising more, and limiting alcohol. This information is not intended to replace advice given to you by your health care provider. Make sure you discuss any questions you have with your health care provider. Document Revised: 03/20/2019 Document Reviewed: 10/24/2016 Elsevier Patient Education  [place].  How to Use a Metered Dose Inhaler A metered dose inhaler is a handheld device for taking medicine that must be breathed into the lungs (inhaled). The device can be used to deliver a variety of inhaled medicines, including:  Quick relief or rescue medicines, such as bronchodilators.  Controller medicines, such as corticosteroids. The medicine is delivered by pushing down on a metal canister to release a preset amount of spray and medicine. Each device contains the amount of medicine that is needed for a preset number of uses (inhalations). Your health care provider may recommend that you use a spacer with your inhaler to help you take the medicine more  effectively. A spacer is a plastic tube with a mouthpiece on one end and an opening that connects to the inhaler on the other end. A spacer holds the medicine in a tube for a short time, which allows you to inhale more medicine. What are the risks? If you do not use your inhaler correctly, medicine might not reach your lungs to help you breathe. Inhaler medicine can cause side effects, such as:  Mouth or throat infection.  Cough.  Hoarseness.  Headache.  Nausea and vomiting.  Lung infection (pneumonia) in people who have a lung condition called COPD. How to use a metered dose inhaler without a spacer  1. Remove the cap from the inhaler. 2. If you are using the inhaler for the first time, shake it for 5 seconds, turn it away from your face, then release 4 puffs into the air. This is called priming. 3. Shake the inhaler for 5 seconds. 4. Position the inhaler so the top of the canister faces up. 5. Put your index finger on the top of the medicine canister. Support the bottom of the inhaler with your thumb. 6. Breathe out normally and as completely as possible, away from the inhaler. 7. Either place the inhaler between your teeth and close your lips tightly around the mouthpiece, or hold the inhaler 1-2 inches (2.5-5 cm) away from your open mouth. Keep your tongue down out of the way. If you are unsure which technique to use, ask your health care provider. 8. Press the canister down with your index finger to release the medicine, then inhale deeply and slowly through your mouth (not your nose) until your lungs are completely filled. Inhaling should take 4-6 seconds. 9. Hold the medicine in your lungs for 5-10 seconds (10 seconds is best). This helps the medicine get into the small airways of your lungs. 10. With your lips in a tight circle (pursed), breathe out slowly. 11. Repeat steps 3-10 until you have taken the number of puffs that your health care provider directed. Wait about 1 minute  between puffs or as directed. 12. Put the cap on the inhaler. 13. If you are using a steroid inhaler, rinse your mouth with water, gargle, and spit out the water. Do not swallow the water. How to use a metered dose inhaler with a spacer  1. Remove the cap from the inhaler. 2. If you are using the inhaler for the first time, shake it for 5 seconds, turn it away from your face, then release 4 puffs into the air. This is called priming. 3. Shake the inhaler for 5 seconds. 4. Place the open end of the spacer onto the inhaler mouthpiece. 5. Position the inhaler so the top of the canister faces up and the spacer mouthpiece faces you. 6. Put your index finger on the top of the medicine canister. Support the bottom of the inhaler and the spacer with your thumb. 7. Breathe out normally and as completely as possible, away from the spacer. 8. Place the spacer between your teeth and close your lips tightly around it. Keep your tongue down out of the way. 9. Press the canister down with your index finger to release the medicine, then inhale deeply and slowly through your mouth (not your nose) until your lungs are completely filled. Inhaling should take 4-6 seconds. 10. Hold the medicine in your lungs for 5-10 seconds (10 seconds is best). This helps the medicine get into the small airways of your lungs. 11. With your lips in a tight circle (pursed), breathe out slowly. 12. Repeat steps 3-11 until you have taken the number of puffs that your health care provider directed. Wait about 1 minute between puffs or as directed. 13. Remove the spacer from the inhaler and put the cap on the inhaler. 14. If you are using a steroid inhaler, rinse your mouth with water, gargle, and spit out the water. Do not swallow the water. Follow these instructions at home:  Take your inhaled medicine only  as told by your health care provider. Do not use the inhaler more than directed by your health care provider.  Keep all  follow-up visits as told by your health care provider. This is important.  If your inhaler has a counter, you can check it to determine how full your inhaler is. If your inhaler does not have a counter, ask your health care provider when you will need to refill your inhaler and write the refill date on a calendar or on your inhaler canister. Note that you cannot know when an inhaler is empty by shaking it.  Follow directions on the package insert for care and cleaning of your inhaler and spacer. Contact a health care provider if:  Symptoms are only partially relieved with your inhaler.  You are having trouble using your inhaler.  You have an increase in phlegm.  You have headaches. Get help right away if:  You feel little or no relief after using your inhaler.  You have dizziness.  You have a fast heart rate.  You have chills or a fever.  You have night sweats.  There is blood in your phlegm. Summary  A metered dose inhaler is a handheld device for taking medicine that must be breathed into the lungs (inhaled).  The medicine is delivered by pushing down on a metal canister to release a preset amount of spray and medicine.  Each device contains the amount of medicine that is needed for a preset number of uses (inhalations). This information is not intended to replace advice given to you by your health care provider. Make sure you discuss any questions you have with your health care provider. Document Revised: 11/08/2017 Document Reviewed: 10/16/2016 Elsevier Patient Education  2020 [employer].

## 2020-11-24 NOTE — Telephone Encounter (Signed)
$  100 copay for fluticasone nasal does [facility] have any alternatives?

## 2020-11-24 NOTE — Telephone Encounter (Signed)
Thank you for sending the prescription. His pulmonary function tests show a mild decrease in his diffusion capacity likely related to his covid infection. 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   Thanks again, [employer]

## 2020-11-24 NOTE — Telephone Encounter (Signed)
Called and spoke to pt. Informed him of the recs per Dr. Francine Graven. Pt verbalized understanding and states he was told by the pharmacy that the Flovent will be over $100 the next time he picks up the script. Advised pt to contact his insurance to see why the change and if there are covered alternatives he can afford. Will keep message open to follow up in left messages.

## 2020-11-25 ENCOUNTER — Telehealth: Payer: Self-pay | Admitting: Registered Nurse

## 2020-11-25 ENCOUNTER — Encounter: Payer: Self-pay | Admitting: Registered Nurse

## 2020-11-25 DIAGNOSIS — E876 Hypokalemia: Secondary | ICD-10-CM

## 2020-11-25 DIAGNOSIS — M2550 Pain in unspecified joint: Secondary | ICD-10-CM

## 2020-11-25 LAB — BASIC METABOLIC PANEL
BUN/Creatinine Ratio: 17 (ref 10–24)
BUN: 19 mg/dL (ref 8–27)
CO2: 21 mmol/L (ref 20–29)
Calcium: 9.5 mg/dL (ref 8.6–10.2)
Chloride: 102 mmol/L (ref 96–106)
Creatinine, Ser: 1.11 mg/dL (ref 0.76–1.27)
GFR calc Af Amer: 81 mL/min/{1.73_m2} (ref >59)
GFR calc non Af Amer: 70 mL/min/{1.73_m2} (ref >59)
Glucose: 137 mg/dL — ABNORMAL HIGH (ref 65–99)
Potassium: 3.1 mmol/L — ABNORMAL LOW (ref 3.5–5.2)
Sodium: 140 mmol/L (ref 134–144)

## 2020-11-25 MED ORDER — POTASSIUM CHLORIDE CRYS ER 20 MEQ PO TBCR: EXTENDED_RELEASE_TABLET | ORAL | 0 refills | Status: DC

## 2020-11-25 NOTE — Telephone Encounter (Signed)
Appt made and sent to pt by email. Refill pulled.

## 2020-11-25 NOTE — Telephone Encounter (Signed)
Patient sent BP log for my review as elevated in clinic yesterday acute visit.  BP has trended up over the past 30 days.  Patient reported he had stopped taking potassium chloride at lunch but still taking x2 twice a day.  Instructed to restart at lunch and continue am/pm.  Repeat potassium level in [redacted] week along with magnesium.  Patient with history elevated uric acid/gout and noted yesterday body wide arthralgias since blood being drawn asked if uric acid can be checked also.  Will check discussed diet with patient potassium rich foods and low purine diet.  Hydrate/avoid dehydration.  Has CT next week ordered by cardiology and that is why BMP drawn yesterday.  Fatigue could be related to low potassium.  RN Rolly Salter was unable to find manufacturer couple for fluticasone inhaled.  [facility] only covers advair/generic at lower cost will notify pulmonology office/patient.  My chart message sent to patient.    Patient verbalized understanding nonfasting labs in 1 week with RN Rolly Salter, increase potassium 1 tab lunch daily and continue 2 am/pm by mouth.  No further questions at this time.  Continue BP and sp02 log.  Will dispense another 30 tabs potassium next week to patient from [facility] when I am onsite.

## 2020-11-25 NOTE — Telephone Encounter (Signed)
Good morning. 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 He asked [place] about alternatives for the Flovent inhaler he was Rx'd by Dr. Francine Graven as it will cost $100/mo with his insurance and there are no coupons or discounts available for this. His insurance has not covered this med in a few years. We were able to use fluticasone-salmeterol as an alternative with another employee previously and that has a $10/mo copay. Would this be an acceptable alternative for him in your opinion? If so, could you please place a new Rx for that to his pharmacy, [facility]? If it is not acceptable, please advise so I can go to back to the insurance company for additional help. Thank you.

## 2020-11-25 NOTE — Telephone Encounter (Signed)
Dr. Francine Graven, please see message from Nance Pew, RN and Albina Billet, NP about mutual pt and advise.

## 2020-11-26 NOTE — Telephone Encounter (Signed)
Noted/reviewed RN Rolly Salter note scheduled labs

## 2020-11-27 NOTE — Telephone Encounter (Signed)
Patient contacted via telephone and he reported received negative covid test results today.  Still having knee pain and fatigue.  Feeling a little better since starting potassium at lunch and continuing BID breakfast/dinner.  Has been eating more bananas and trying to decrease purine rich foods such as beer in diet this weekend.  Discussed due to cold weather may want to put heat pack on knees to see if that helps to decrease pain also and hydrate with water in case it is elevated uric acid level/gout flare up along with continuing low purine diet.  Patient scheduled for nonfasting labs on Thursday 23 Dec with RN Rolly Salter.  Discussed message sent to pulmonologist by RN Rolly Salter regarding formulary for [facility] fluticasone alternatives.  Discussed with patient advair has long acting beta agonist and inhaled steroid instead of just inhaled steroid and need approval from pulmonologist to switch.  Patient verbalized understanding information/instructions, agreed with plan of care and had no further questions at this time.  Patient spoke full sentences without difficulty A&Ox3 respirations even and unlabored RA no cough/throat clearing/nasal clearing noted during telephone call duration 5 minutes.

## 2020-11-28 MED ORDER — FLUTICASONE-SALMETEROL 250-50 MCG/DOSE IN AEPB
1.0000 | INHALATION_SPRAY | Freq: Two times a day (BID) | RESPIRATORY_TRACT | 11 refills | Status: DC
Start: 2020-11-28 — End: 2021-09-28

## 2020-11-28 NOTE — Telephone Encounter (Signed)
Medication has been sent to pharmacy as requested. Sending back to [place] as FYI.

## 2020-11-28 NOTE — Telephone Encounter (Signed)
This is an acceptable alternative. I have sent the prescription in. Please let me know if there are any other issues with insurance coverage.  Thanks, Ryan Glass

## 2020-11-28 NOTE — Telephone Encounter (Signed)
Called pt and had to [facility]- will hold in triage since msg not started in triage.

## 2020-11-29 ENCOUNTER — Ambulatory Visit (INDEPENDENT_AMBULATORY_CARE_PROVIDER_SITE_OTHER)
Admission: RE | Admit: 2020-11-29 | Discharge: 2020-11-29 | Disposition: A | Discharge status: Home / Self Care | Payer: No Typology Code available for payment source | Source: Ambulatory Visit | Attending: Cardiology | Admitting: Cardiology

## 2020-11-29 ENCOUNTER — Other Ambulatory Visit: Payer: Self-pay

## 2020-11-29 DIAGNOSIS — I712 Thoracic aortic aneurysm, without rupture, unspecified: Secondary | ICD-10-CM

## 2020-11-29 IMAGING — CT CT ANGIO CHEST
3 of 8 series · 18 of 46 positions shown · IV contrast (OMNIPAQUE 350)
Comparison: CT dated [DATE].

CLINICAL DATA: Thoracic aortic aneurysm follow-up.

EXAM:
CT ANGIOGRAPHY CHEST WITH CONTRAST
TECHNIQUE: Multidetector CT imaging of the chest was performed using the
standard protocol during bolus administration of intravenous
contrast. Multiplanar CT image reconstructions and MIPs were
obtained to evaluate the vascular anatomy.
CONTRAST:  100mL OMNIPAQUE IOHEXOL 350 MG/ML SOLN

[Series 4: aorta 3.0 bf37 2 · axial · 0.83mm/px · z∈[-348,-72]mm · 13 of 108 slices shown]
[im 8/108  lung]
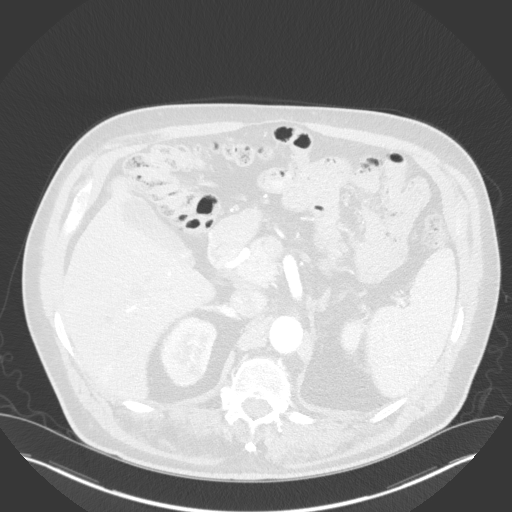
[im 16/108  soft-tissue]
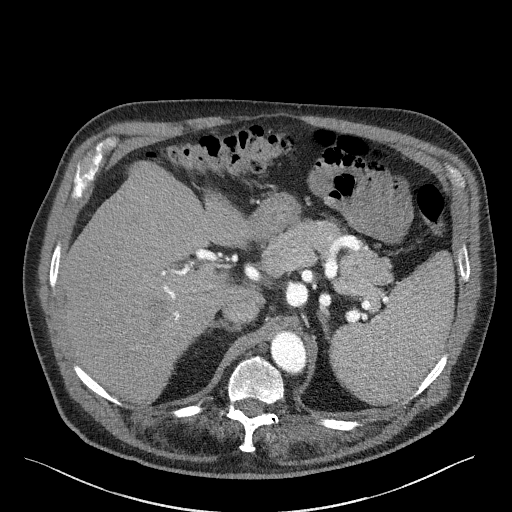
[im 23/108  lung]
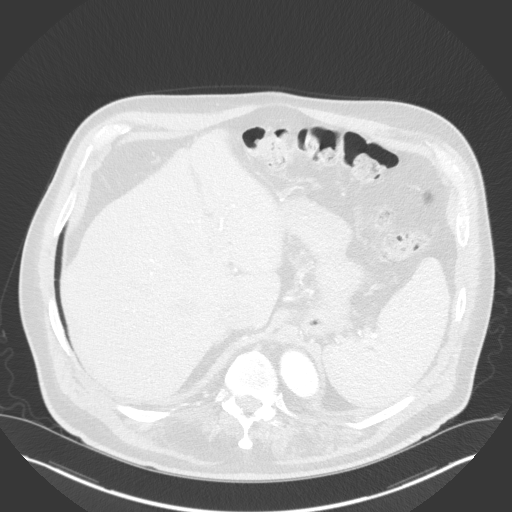
[im 31/108  soft-tissue]
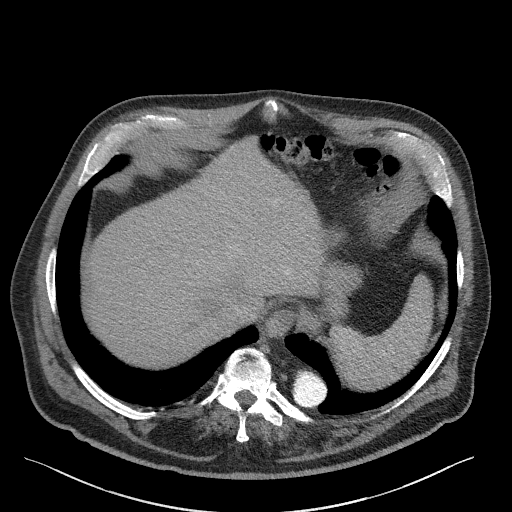
[im 39/108  lung]
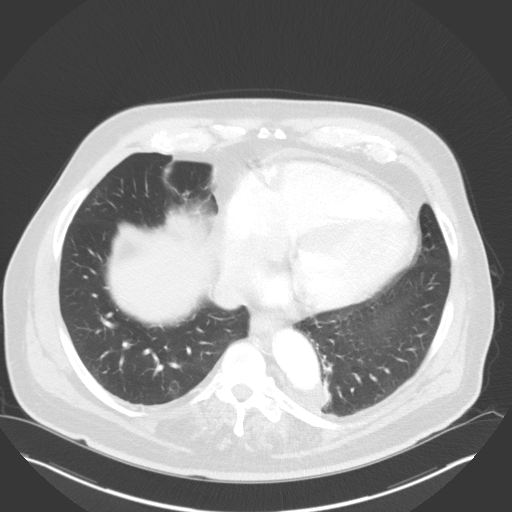
[im 46/108  soft-tissue]
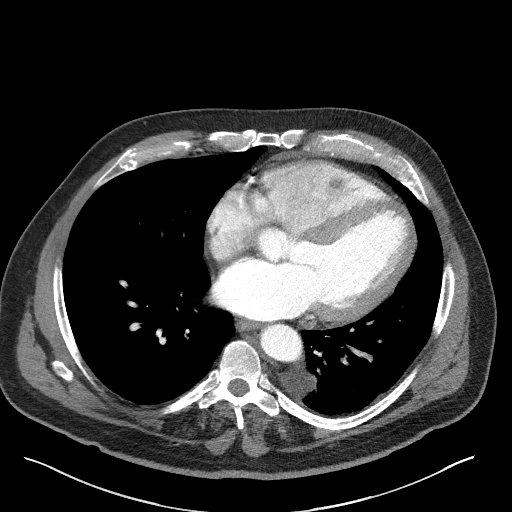
[im 54/108  lung]
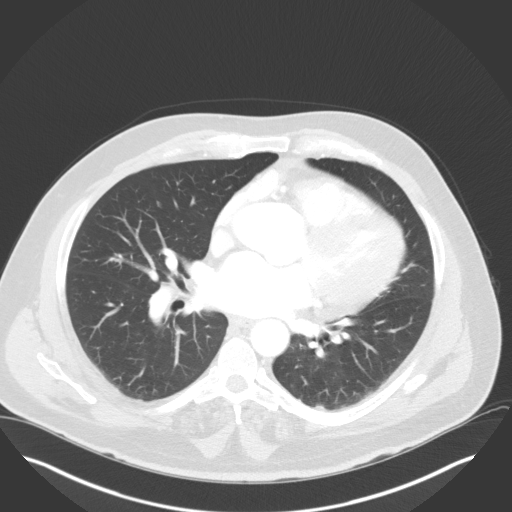
[im 62/108  soft-tissue]
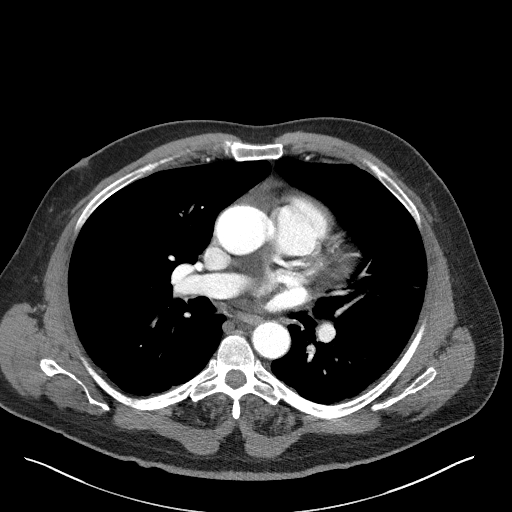
[im 69/108  lung]
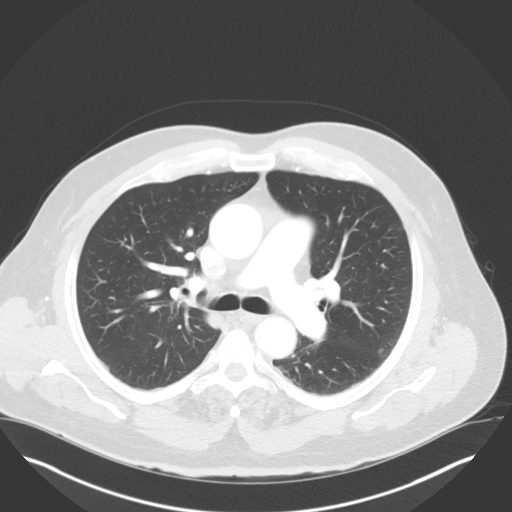
[im 77/108  soft-tissue]
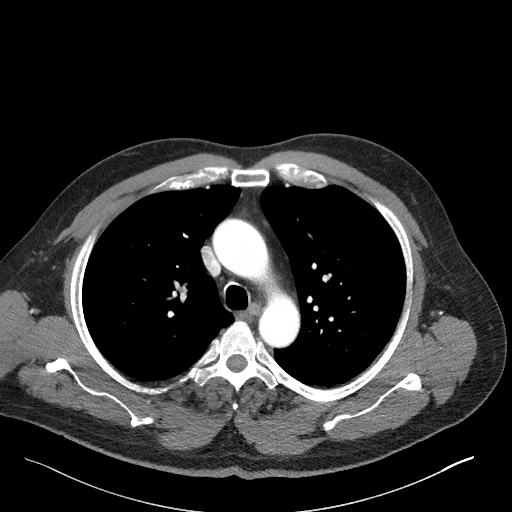
[im 85/108  lung]
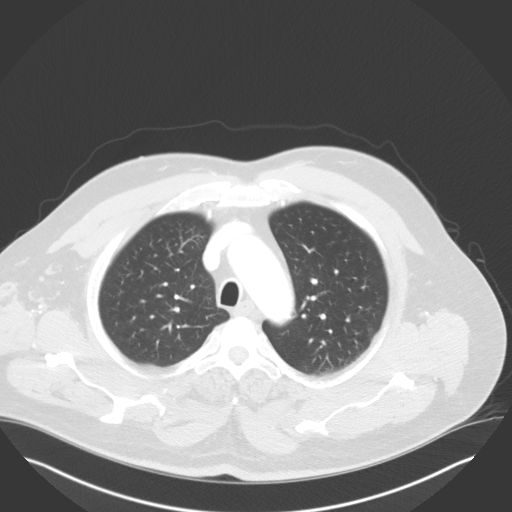
[im 92/108  soft-tissue]
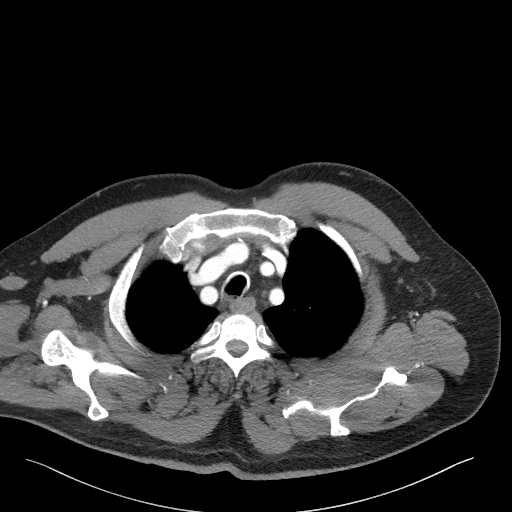
[im 100/108  lung]
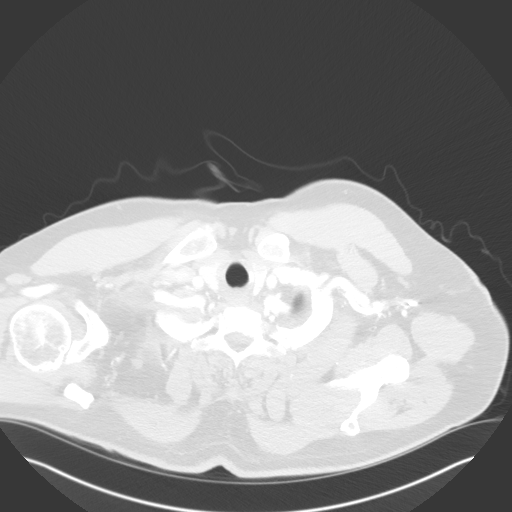

[Series 5: lung · axial · 0.83mm/px · z∈[-348,-304]mm · 2 of 108 slices shown]
[im 8/108  soft-tissue]
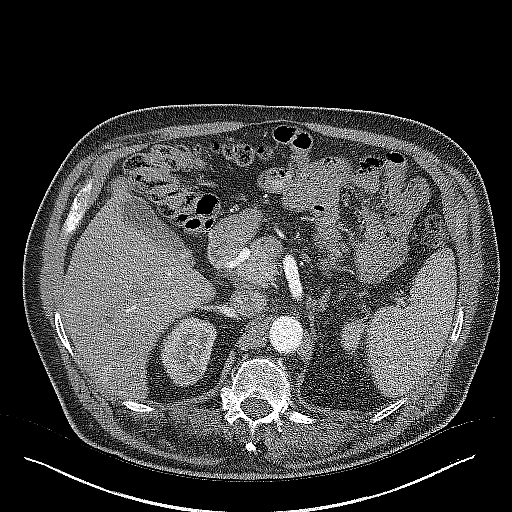
[im 23/108  soft-tissue]
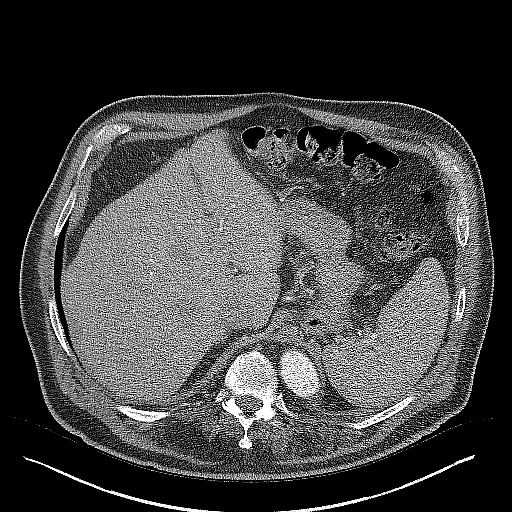

[Series 7: coronals · coronal · 0.66mm/px · 3 of 142 slices shown]
[im 36/142  soft-tissue]
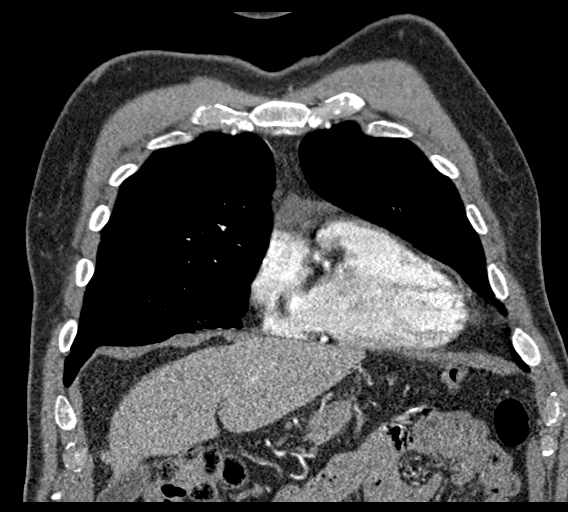
[im 71/142  soft-tissue]
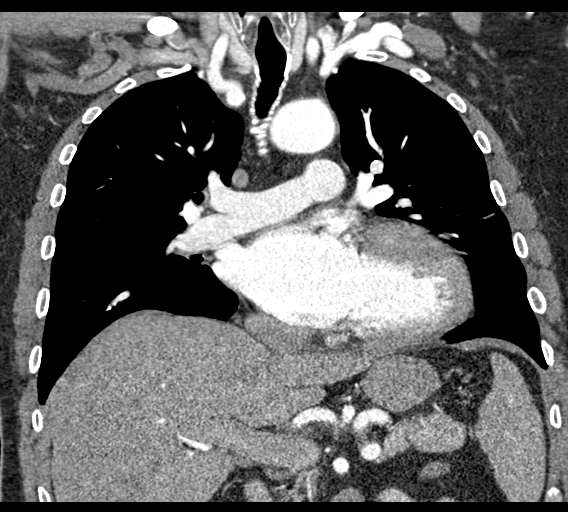
[im 106/142  soft-tissue]
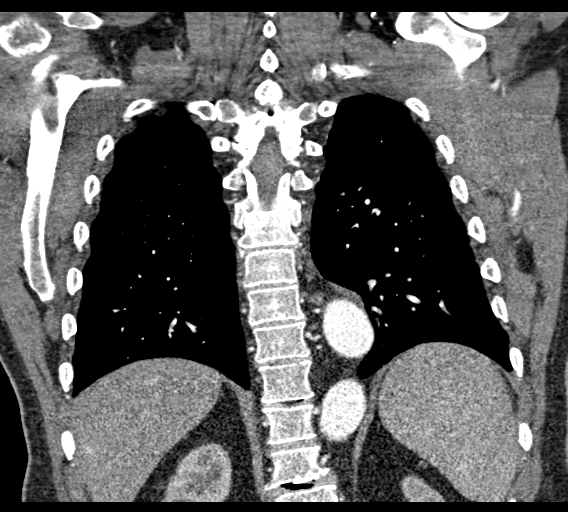

[18 of 46 positions shown; findings below may reference images not displayed]

FINDINGS: Cardiovascular: The ascending thoracic aorta is aneurysmal measuring
approximately 4.4 cm in diameter. The sinus of Valsalva measures
approximately 4.5 cm in diameter. There are mild atherosclerotic
changes of the thoracic aorta. There is no thoracic aortic
dissection. The heart size remains enlarged. There is a small
pericardial effusion. There is no large centrally located pulmonary
embolism.

Mediastinum/Nodes:

-- No mediastinal lymphadenopathy.

-- No hilar lymphadenopathy.

-- No axillary lymphadenopathy.

-- No supraclavicular lymphadenopathy.

--there is a stable small left-sided thyroid nodule measuring
approximately 1.1 cm. No followup recommended (ref: [HOSPITAL]. [DATE]): 143-50).

-  Unremarkable esophagus.

Lungs/Pleura: There is atelectasis at the lung bases. There is a
small left-sided pleural effusion. There is no pneumothorax. No
focal infiltrate. The trachea is unremarkable.

Upper Abdomen: Contrast bolus timing is not optimized for evaluation
of the abdominal organs. There are multiple small hyperattenuating
areas in the patient's liver favored to represent flash hemangiomas.
Again noted is dilatation of the celiac trunk measuring
approximately 1.8 cm.

Musculoskeletal: No chest wall abnormality. No bony spinal canal
stenosis.

Review of the MIP images confirms the above findings.
IMPRESSION: 1. Persistent ascending thoracic aortic aneurysm measuring
approximately 4.4 cm. Recommend annual imaging followup by CTA or
MRA. This recommendation follows [ZP]
ACCF/AHA/AATS/ACR/ASA/SCA/BEU/BEU/BEU/BEU Guidelines for the
Diagnosis and Management of Patients with Thoracic Aortic Disease.
Circulation. [ZP]; 121: [id number]. Aortic aneurysm NOS ([ZP]-[ZP])
2. Stable dilatation of the celiac trunk measuring approximately
cm.
3. Small left-sided pleural effusion.
4. Cardiomegaly with a small pericardial effusion.

Aortic Atherosclerosis ([ZP]-[ZP]).

## 2020-11-29 MED ORDER — IOHEXOL 350 MG/ML SOLN
100.0000 mL | Freq: Once | INTRAVENOUS | Status: AC | PRN
  Administered 2020-11-29: 100 mL via INTRAVENOUS

## 2020-11-29 NOTE — Telephone Encounter (Signed)
Patient given his 30 tabs potassium chloride to take at lunch daily and pending repeat potassium level in 48 hours.  Has CT scan today for cardiology at 1500.  Feeling well and no concerns at this time.

## 2020-11-30 ENCOUNTER — Encounter: Payer: Self-pay | Admitting: Cardiology

## 2020-12-01 ENCOUNTER — Ambulatory Visit: Payer: Self-pay | Admitting: *Deleted

## 2020-12-01 ENCOUNTER — Other Ambulatory Visit: Payer: Self-pay

## 2020-12-01 VITALS — BP 142/85 | HR 74

## 2020-12-01 DIAGNOSIS — E876 Hypokalemia: Secondary | ICD-10-CM

## 2020-12-01 DIAGNOSIS — M2550 Pain in unspecified joint: Secondary | ICD-10-CM

## 2020-12-01 NOTE — Telephone Encounter (Signed)
Noted patient picked up his Rx and started new inhaler denied side effects, voice strong on telephone and no questions or concerns.  Will follow up with cardiology and pulmonology regarding CT scan serial for aneurysm (cardiology) new findings noted lungs and heart.

## 2020-12-01 NOTE — Telephone Encounter (Signed)
Notified pt of approved alternative med on 12/21 and pt stated he was going to go pick it up that evening. He first used last night and this morning. No side effects with it.   He also mentioned that Dr. Francine Graven wanted to see how Chest CTA that was performed on 11/29/20 looked to determine if further imaging was needed. That is available for review now. Thank you for your help with our mutual pt!

## 2020-12-01 NOTE — Progress Notes (Signed)
Non-fasting labs per NP orders. 

## 2020-12-02 LAB — BASIC METABOLIC PANEL
BUN/Creatinine Ratio: 17 (ref 10–24)
BUN: 16 mg/dL (ref 8–27)
CO2: 20 mmol/L (ref 20–29)
Calcium: 9.7 mg/dL (ref 8.6–10.2)
Chloride: 102 mmol/L (ref 96–106)
Creatinine, Ser: 0.92 mg/dL (ref 0.76–1.27)
GFR calc Af Amer: 101 mL/min/{1.73_m2} (ref >59)
GFR calc non Af Amer: 88 mL/min/{1.73_m2} (ref >59)
Glucose: 108 mg/dL — ABNORMAL HIGH (ref 65–99)
Potassium: 3.9 mmol/L (ref 3.5–5.2)
Sodium: 140 mmol/L (ref 134–144)

## 2020-12-02 LAB — URIC ACID: Uric Acid: 4.2 mg/dL (ref 3.8–8.4)

## 2020-12-02 LAB — MAGNESIUM: Magnesium: 1.9 mg/dL (ref 1.6–2.3)

## 2020-12-05 NOTE — Progress Notes (Signed)
Results routed to pcp. Not routed to Rheumatology as pt was seen in their office 12/23 and had same plus additional labs completed r/t joint pain/?gout flare.

## 2020-12-05 NOTE — Telephone Encounter (Signed)
Called and spoke to pt. Pt states he already has a new inhaler and there are no issues. Per pt's chart pt is now using Advair. Pt states he had a CT angio chest/aorta and is questioning if he still needs to have a HRCT and if Dr Francine Graven could look at the recent CT and see what is seen regarding his lungs.   Dr. Francine Graven please advise on if pt needs a HRCT and what the recent CT angio shows regarding his lungs. Thanks.

## 2020-12-06 NOTE — Progress Notes (Signed)
Noted reviewed rheumatology note and patient received joint injection from rheumatology at appt last week

## 2020-12-06 NOTE — Telephone Encounter (Signed)
Patient not in workcenter today when visited will follow up when onsite again 12/08/2020

## 2020-12-07 ENCOUNTER — Other Ambulatory Visit: Payer: Self-pay | Admitting: Physician Assistant

## 2020-12-11 ENCOUNTER — Other Ambulatory Visit: Payer: Self-pay | Admitting: Cardiology

## 2020-12-12 NOTE — Telephone Encounter (Signed)
Dr. Dewald please advise, thanks!  

## 2020-12-13 NOTE — Telephone Encounter (Signed)
We can hold off on HRCT for now. We will see how patient does with inhaler therapy and how his repeat CTA looks.   Thanks, Cletis Athens

## 2020-12-14 NOTE — Telephone Encounter (Signed)
I reviewed the CTA from 12/01/20 and I don't see any parenchymal issues nor is it noted in the radiology read. Based on this I don't think an HRCT would give [place] much more info. His PFTs only show a mild diffusion defect, otherwise spirometry and lung volumes are normal. If his shortness of breath has not improved with the advair he has recently been started on, then I am thinking his dyspnea is less likely related to any underlying lung pathology.    His dyspnea may be related to deconditioning and obesity. We could look into his sleep apnea a bit more, but from my note it seemed like he had a sleep specialist following him. His blood pressure may also be contributing to it as it has been elevated on recent readings.  He certainly can be seen by an NP sooner if he would like.    Thanks, Cletis Athens

## 2020-12-14 NOTE — Telephone Encounter (Signed)
I dont see a HRCT to cancel will forward to triage to close message

## 2020-12-14 NOTE — Telephone Encounter (Signed)
Says he is getting worse with increased dyspnea and wants to be seen sooner.  Wants recommendations .

## 2020-12-15 ENCOUNTER — Other Ambulatory Visit: Payer: Self-pay | Admitting: Physician Assistant

## 2020-12-15 NOTE — Telephone Encounter (Signed)
Pt is returning call regarding results - CB# [phone]

## 2020-12-15 NOTE — Telephone Encounter (Signed)
lmtcb for pt.  

## 2020-12-15 NOTE — Telephone Encounter (Signed)
Called back, no answer.

## 2020-12-20 ENCOUNTER — Telehealth: Payer: Self-pay | Admitting: Registered Nurse

## 2020-12-20 ENCOUNTER — Encounter: Payer: Self-pay | Admitting: Registered Nurse

## 2020-12-20 NOTE — Telephone Encounter (Signed)
Patient is returning phone call. Patient phone number is [phone].

## 2020-12-20 NOTE — Telephone Encounter (Signed)
Lmtcb for pt.  

## 2020-12-20 NOTE — Telephone Encounter (Signed)
Patient is returning phone call. Patient phone number is [phone]. Patient would like to be called at 3:45 pm.

## 2020-12-20 NOTE — Telephone Encounter (Signed)
Called and spoke with pt letting him know the info stated by Dr. Francine Graven and he verbalized understanding. Asked pt if he wanted to be scheduled for a sooner appt by APP or Dr. Francine Graven and he stated at this time he did not want to schedule a sooner appt. Stated to pt if he started having more issues and wanted to be seen to call office for an appt. Nothing further needed.

## 2020-12-20 NOTE — Telephone Encounter (Signed)
LMTCB

## 2020-12-20 NOTE — Telephone Encounter (Signed)
Patient saw email at work covid vaccine booster available at work next week.  He is wondering if he should receive vaccine.  Discussed with patient CDC updated guidance this week for immunocompromised 4th vaccine dose recommended this week and he may schedule appt for dose onsite next Tuesday here at work. Patient needs to bring his vaccine card to clinic for transcription into Epic as does not remember if moderna or pfizer.  Last dose 07/25/2020 #3.  Per CDC "booster dose in people ages 14 years and older who are moderately or severely immunocompromised should receive a single covid 19 booster dose preferably with an mRNA covid 19 vaccine at least 5 months after the additional primary dose".   Patient verbalized understanding information/instructions and had no further questions at this time.

## 2020-12-30 ENCOUNTER — Telehealth: Payer: Self-pay | Admitting: *Deleted

## 2020-12-30 ENCOUNTER — Encounter: Payer: Self-pay | Admitting: Cardiology

## 2020-12-30 ENCOUNTER — Ambulatory Visit (INDEPENDENT_AMBULATORY_CARE_PROVIDER_SITE_OTHER): Payer: No Typology Code available for payment source | Admitting: Cardiology

## 2020-12-30 ENCOUNTER — Other Ambulatory Visit: Payer: Self-pay

## 2020-12-30 VITALS — BP 114/84 | HR 78 | Ht 68.0 in | Wt 257.6 lb

## 2020-12-30 DIAGNOSIS — E7849 Other hyperlipidemia: Secondary | ICD-10-CM | POA: Diagnosis not present

## 2020-12-30 DIAGNOSIS — I493 Ventricular premature depolarization: Secondary | ICD-10-CM

## 2020-12-30 DIAGNOSIS — I7121 Aneurysm of the ascending aorta, without rupture: Secondary | ICD-10-CM

## 2020-12-30 DIAGNOSIS — G4733 Obstructive sleep apnea (adult) (pediatric): Secondary | ICD-10-CM

## 2020-12-30 DIAGNOSIS — I712 Thoracic aortic aneurysm, without rupture, unspecified: Secondary | ICD-10-CM

## 2020-12-30 DIAGNOSIS — R0602 Shortness of breath: Secondary | ICD-10-CM

## 2020-12-30 DIAGNOSIS — I10 Essential (primary) hypertension: Secondary | ICD-10-CM

## 2020-12-30 NOTE — Progress Notes (Signed)
Date:  12/30/2020   ID:  Ryan Glass, DOB [birth year], MRN [id number]  PCP:  [name], MD  Cardiologist:  [name], MD Electrophysiologist:  [name], MD   Chief Complaint:  PVCs  History of Present Illness:    Ryan Glass is a 65 y.o. 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 Ladona Ridgel, dilated aortic root, hypertension, lower extremity edema and chest pain.   Event monitor done for palpitations showed frequent PVCs and 24-hour Holter showed 22% PVC load. He was referred  to EP who felt that the PVCs were originating to the lateral wall of the inferior apex of the RV.  He underwent successful PVC ablation in November 2019. He also has OSA and is on PAP.   He had some problems at one point with fatigue and bradycardia(per patient but felt to be due to nonperfusing PVCs) and stopped all his meds and was seen in the office with EKG showing NSR with bigeminal PVCs and BP was running high as well.  Recently he has been on Bystolic 10mg  daily.   HE is here today for followup and is doing well. He has had SOB for the past 3-4 months after having COVID 19 in August.  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  He also has RA.  He was started on Flovent.  PFTs showed a mild decrease in DLCO felt related to COVID infection.   He had a pleural effusion on the left by CT in Dec 2021.   His pulmonologist wanted to see how he did on inhalers and possibly get HRCT.    He thinks his breathing has improved some.  He denies any chest pain or pressure, PND, orthopnea, LE edema, dizziness, palpitations or syncope. He is compliant with his meds and is tolerating meds with no SE.    He is doing well with his CPAP device and thinks that he has gotten used to it.  He tolerates the mask and feels the pressure is adequate.  Since going on CPAP he feels rested in the am and has no significant daytime sleepiness.  He denies any significant  mouth or nasal dryness or nasal congestion.  He does not think that he snores.     Prior CV studies:   The following studies were reviewed today:  Chest CTA 11/2020  Past Medical History:  Diagnosis Date  . Ascending aortic aneurysm (HCC)    4.4cm on CTA 11/2020  . Bilateral lower extremity edema   . Chronic gout without tophus    followed by dr 12/2020    (06-08-2020  per pt last episode right knee 3 wks ago)  . CKD (chronic kidney disease), stage II    nephrology--- 06-10-2020 [place] (10-29-2019 note in epic scanned in  media)  . Hypertension    followed by cardiology, dr t. Momodou Consiglio   (05-14-2018 nuclear study in epic , normal perfusion with nuclear ef 61%)  . Left hydrocele   . OSA on CPAP    per pt uses every night  . Palpitations followed by dr t. 07-14-2018   06-08-2020  still feels palipations due to PVCs when exertion but not with chest pain/ discomfort  . PVC's (premature ventricular contractions) cardiologist--- dr t. Jacarie Pate   Status post PVC ablation by Dr. 06-10-2020 2019 with recurrence of frequent PVCs/bigeminy;  prior pseudobradycardia r/t pvcs  . Rheumatoid arthritis involving multiple  sites [facility])    rheumotology--- dr a. Sharmon Revere  ([facility] in [place])   Past Surgical History:  Procedure Laterality Date  . HYDROCELE EXCISION Left 06/14/2020   Procedure: LEFT  HYDROCELECTOMY ADULT;  Surgeon: Noel Christmas, MD;  [facility];  Service: Urology;  Laterality: Left;  . INCISIONAL HERNIA REPAIR  02-23-2016   @[facility]    LAPAROSCOPIC  . LAPAROSCOPIC INGUINAL HERNIA REPAIR Bilateral 08-22-2015  @[facility]    AND UMBILICAL HERNIA REPAIR  . PVC ABLATION N/A 10/07/2018   Procedure: PVC ABLATION;  Surgeon: , MD;  Location: [facility] INVASIVE CV LAB;  Service: Cardiovascular;  Laterality: N/A;  . UMBILICAL HERNIA REPAIR  child     Current Meds  Medication Sig  . acetaminophen-codeine (TYLENOL #3) 300-30 MG tablet 1-2 tablets as needed.   10/09/2018 albuterol  (VENTOLIN HFA) 108 (90 Base) MCG/ACT inhaler Inhale 2 puffs into the lungs every 4 (four) hours as needed for wheezing or shortness of breath.  Marinus Maw albuterol (VENTOLIN HFA) 108 (90 Base) MCG/ACT inhaler Inhale 1-2 puffs into the lungs every 4 (four) hours as needed for wheezing or shortness of breath.  Marland Kitchen amLODipine (NORVASC) 5 MG tablet Take 1 tablet (5 mg total) by mouth 2 (two) times daily.  Marland Kitchen atorvastatin (LIPITOR) 20 MG tablet Take 1 tablet (20 mg total) by mouth daily. (Patient taking differently: Take 20 mg by mouth at bedtime.)  . chlorthalidone (HYGROTON) 25 MG tablet TAKE ONE TABLET BY MOUTH DAILY  . colchicine 0.6 MG tablet Take 1-2 tablets by mouth daily as needed (gout).   Marland Kitchen diclofenac (VOLTAREN) 75 MG EC tablet Take 75 mg by mouth 2 (two) times daily.  Marland Kitchen SURECLICK 50 MG/ML injection Inject 50 mg into the skin once a week.   . febuxostat (ULORIC) 40 MG tablet Take 1 tablet by mouth daily.  . fluticasone (FLONASE) 50 MCG/ACT nasal spray SPRAY ONE SPRAY IN EACH NOSTRIL TWICE DAILY (Patient taking differently: Place 1 spray into both nostrils in the morning and at bedtime.)  . Fluticasone-Salmeterol (ADVAIR) 250-50 MCG/DOSE AEPB Inhale 1 puff into the lungs every 12 (twelve) hours.  . folic acid (FOLVITE) 1 MG tablet Take 1 tablet by mouth daily.  Marland Kitchen gabapentin (NEURONTIN) 600 MG tablet Take 600 mg by mouth 3 (three) times daily.   . hydrALAZINE (APRESOLINE) 100 MG tablet Take 1 tablet (100 mg total) by mouth 3 (three) times daily.  Elgie Collard loratadine (CLARITIN) 10 MG tablet Take 1 tablet (10 mg total) by mouth daily.  Marland Kitchen losartan (COZAAR) 100 MG tablet Take 1 tablet (100 mg total) by mouth daily. Please keep upcoming appt in January 2022 with Dr. Marland Kitchen before anymore refills. Thank you  . methotrexate 2.5 MG tablet Take 15 mg by mouth once a week.  . montelukast (SINGULAIR) 10 MG tablet TAKE ONE TABLET BY MOUTH EVERY NIGHT AT BEDTIME  . nebivolol (BYSTOLIC) 5 MG tablet Take 2 tablets (10 mg  total) by mouth at bedtime.  . potassium chloride SA (KLOR-CON M20) 20 MEQ tablet Take 2 tablets (40 mEq total) by mouth 2 (two) times daily AND 1 tablet (20 mEq total) daily with lunch.  . predniSONE (DELTASONE) 5 MG tablet Take 10 mg by mouth daily.   . sildenafil (REVATIO) 20 MG tablet Take 1 tablet by mouth daily as needed.     Allergies:   Amlodipine besylate and Aspirin   Social History   Tobacco Use  . Smoking status: Never Smoker  . Smokeless tobacco:  Never Used  Vaping Use  . Vaping Use: Never used  Substance Use Topics  . Alcohol use: Yes    Alcohol/week: 5.0 - 7.0 standard drinks    Types: 5 - 7 Cans of beer per week  . Drug use: Never     Family Hx: The patient's family history includes Cardiomyopathy in his mother; Heart attack in his father.  ROS:   Please see the history of present illness.     All other systems reviewed and are negative.   Labs/Other Tests and Data Reviewed:    Recent Labs: 07/12/2020: ALT 28 10/10/2020: Hemoglobin 13.8; Platelets 169 12/01/2020: BUN 16; Creatinine, Ser 0.92; Magnesium 1.9; Potassium 3.9; Sodium 140   Recent Lipid Panel Lab Results  Component Value Date/Time   CHOL 104 09/24/2019 11:02 AM   TRIG 98 09/24/2019 11:02 AM   HDL 36 (L) 09/24/2019 11:02 AM   CHOLHDL 2.9 09/24/2019 11:02 AM   LDLCALC 49 09/24/2019 11:02 AM    Wt Readings from Last 3 Encounters:  12/30/20 257 lb 9.6 oz (116.8 kg)  11/24/20 244 lb 8 oz (110.9 kg)  11/15/20 253 lb (114.8 kg)     Objective:    Vital Signs:  BP 114/84   Pulse 78   Ht 5\' 8"  (1.727 m)   Wt 257 lb 9.6 oz (116.8 kg)   SpO2 91%   BMI 39.17 kg/m    GEN: Well nourished, well developed in no acute distress HEENT: Normal NECK: No JVD; No carotid bruits LYMPHATICS: No lymphadenopathy CARDIAC:RRR, no murmurs, rubs, gallops RESPIRATORY:  Clear to auscultation without rales, wheezing or rhonchi  ABDOMEN: Soft, non-tender, non-distended MUSCULOSKELETAL:  No edema; No  deformity  SKIN: Warm and dry NEUROLOGIC:  Alert and oriented x 3 PSYCHIATRIC:  Normal affect    ASSESSMENT & PLAN:    1.  PVCs -s/p PVC ablation  -PVCs reoccurred and are from different focus than pre ablation -Holter monitor showed PVC load at 8% -EP recommended staying on BB -he has not had any palpitations -continue Bystolic 10mg  daily  2.  HTN -BP adequately controlled -continue Bystolic 10mg  daily, Hydralazine 100mg  TID, Chlorthalidone 25mg  daily, Amlodipine 10mg  daily, Losartan 100mg  daily -SCr stable at 0.92 in Dec 2021  3.  Ascending aortic aneurysm/Aortic atherosclerosis -4.4cm by Chest CT a year ago -repeat chest CTA to assess for stability 11/2021 -BP adequately controlled -continue statin  4.  HLD -LDL goal < 70 due to vascular disease -continue atorvastatin 20mg  daily -check FLP and ALT  5.  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  OSA -The patient is tolerating PAP therapy well without any problems. The PAP download was reviewed today and showed an AHI of 11.6/hr on auto CPAP cm H2O with 7% compliance in using more than 4 hours nightly.  The patient has been using and benefiting from PAP use and will continue to benefit from therapy.  -his mask has a large leak likely increasing his apneas -I will order a new mask and encouraged him to use his device every night (he uses 2 machines in different rooms and one has an SD card so this is why his compliance appears low because his download we have today is the newer device)   Medication Adjustments/Labs and Tests Ordered: Current medicines are reviewed at length with the patient today.  Concerns regarding medicines are outlined above.  Tests Ordered: No orders of the defined types were placed  in this encounter.  Medication Changes: No orders of the defined types were placed in this encounter.   Disposition:  followup with me in 1  year  Signed, [name], MD  12/30/2020 3:12 PM    [facility]

## 2020-12-30 NOTE — Telephone Encounter (Signed)
-----   Message from Quintella Reichert, MD sent at 12/30/2020  3:13 PM EST ----- Please order a new full face mask with supplies and get a download in 4 weeks

## 2020-12-30 NOTE — Addendum Note (Signed)
Addended by: Macie Burows on: 12/30/2020 03:24 PM   Modules accepted: Orders

## 2020-12-30 NOTE — Patient Instructions (Signed)
Medication Instructions:  Your physician recommends that you continue on your current medications as directed. Please refer to the Current Medication list given to you today.  *If you need a refill on your cardiac medications before your next appointment, please call your pharmacy*   Lab Work: Tuesday January 03, 2021:  Fasting lipid panel and ALT  If you have labs (blood work) drawn today and your tests are completely normal, you will receive your results only by: Marland Kitchen MyChart Message (if you have MyChart) OR . A paper copy in the mail If you have any lab test that is abnormal or we need to change your treatment, we will call you to review the results.   Testing/Procedures: Your provider requested that you have a Cardiac in December 2022 Cardiac CT Angiography (CTA), is a special type of CT scan that uses a computer to produce multi-dimensional views of major blood vessels throughout the body. In CT angiography, a contrast material is injected through an IV to help visualize the blood vessels   Follow-Up: At [facility], you and your health needs are our priority.  As part of our continuing mission to provide you with exceptional heart care, we have created designated Provider Care Teams.  These Care Teams include your primary Cardiologist (physician) and Advanced Practice Providers (APPs -  Physician Assistants and Nurse Practitioners) who all work together to provide you with the care you need, when you need it.    Your next appointment:   1 year(s)  The format for your next appointment:   In Person  Provider:   You may see [name], MD or one of the following Advanced Practice Providers on your designated Care Team:    Ronie Spies, PA-C  Jacolyn Reedy, PA-C

## 2021-01-03 ENCOUNTER — Encounter: Payer: Self-pay | Admitting: Registered Nurse

## 2021-01-03 ENCOUNTER — Other Ambulatory Visit: Payer: No Typology Code available for payment source

## 2021-01-03 ENCOUNTER — Telehealth: Payer: Self-pay | Admitting: Registered Nurse

## 2021-01-03 ENCOUNTER — Ambulatory Visit: Payer: Self-pay | Admitting: *Deleted

## 2021-01-03 ENCOUNTER — Other Ambulatory Visit: Payer: Self-pay

## 2021-01-03 VITALS — BP 147/99 | HR 65 | Wt 249.0 lb

## 2021-01-03 DIAGNOSIS — R5382 Chronic fatigue, unspecified: Secondary | ICD-10-CM

## 2021-01-03 DIAGNOSIS — U099 Post covid-19 condition, unspecified: Secondary | ICD-10-CM

## 2021-01-03 DIAGNOSIS — G9332 Myalgic encephalomyelitis/chronic fatigue syndrome: Secondary | ICD-10-CM

## 2021-01-03 DIAGNOSIS — R0609 Other forms of dyspnea: Secondary | ICD-10-CM

## 2021-01-03 DIAGNOSIS — R0600 Dyspnea, unspecified: Secondary | ICD-10-CM

## 2021-01-03 DIAGNOSIS — I10 Essential (primary) hypertension: Secondary | ICD-10-CM

## 2021-01-03 NOTE — Telephone Encounter (Signed)
Patient seen in his workcenter today.  Follow up regarding his shortness of breath with exertion and how he is feeling since starting inhaled steroids.  He reported using inhaled steroid inhaler twice a day and sometimes he doesn't think it is dispensing correctly as he can taste medicine sometimes but not other times.  He is still using albuterol inhaler twice a day.  He has definitely noticed when cold outside more symptomatic.  He reported had annual follow up with cardiologist Traci last week and no cardiac concerns.  She thinks his breathing symptoms still related to post covid infection.  Patient hand arthritis and knees flaring.  He is planning to follow up with rheumatology for left knee injection as had right done and it helped for a couple of weeks.  Discussed with patient I would review epic when I returned to my desk and if any other questions or concerns would contact him.  Patient respirations even and unlabored RA no cough/nasal clearing/throat clearing or congestion noted.  Spoke full sentences without difficulty.  PIP/DIP/MCP joints enlarged bilaterally not hot to touch.  Patient reported using ice prn and taking his prescribed medications.  Discussed with patient if hand stiffness recommend gentle AROM/epsom water hot soaks 15 minutes prn or applying heat pack 15 minutes.  If hot/swollen joint I recommend cryotherapy 15 minutes QID prn.  Patient verbalized understanding information/instructions, agreed with plan of care and had no further questions at this time.  Reviewed Epic patient sp02 91% at cardiology and weight gain greater than 10 lbs noted.  Will have patient come to clinic for VS/sp02/weight recheck later today.  RN Rolly Salter notified.

## 2021-01-03 NOTE — Progress Notes (Signed)
Routine BP, Wt, and spot O2 check per NP Inetta Fermo request per [facility] today.

## 2021-01-03 NOTE — Telephone Encounter (Signed)
Order placed to [facility] via community message. 

## 2021-01-03 NOTE — Telephone Encounter (Signed)
Completed. See separate nurse encounter 01/03/21.

## 2021-01-03 NOTE — Telephone Encounter (Signed)
Noted sp02 and weight improved on recheck today.  Patient could not remember eating anything out of ordinary prior to cardiology appt.  He has noticed weight gain over the holidays and trying to lose weight again.  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  Spoke full sentences without difficulty.  No cough/wheeze.

## 2021-01-05 ENCOUNTER — Ambulatory Visit: Payer: Self-pay | Admitting: *Deleted

## 2021-01-05 ENCOUNTER — Telehealth: Payer: Self-pay | Admitting: Registered Nurse

## 2021-01-05 ENCOUNTER — Telehealth: Payer: Self-pay | Admitting: *Deleted

## 2021-01-05 ENCOUNTER — Other Ambulatory Visit: Payer: Self-pay

## 2021-01-05 DIAGNOSIS — E7849 Other hyperlipidemia: Secondary | ICD-10-CM

## 2021-01-05 MED ORDER — AMLODIPINE BESYLATE 5 MG PO TABS: 5.0000 mg | ORAL_TABLET | Freq: Two times a day (BID) | ORAL | 3 refills | Status: DC

## 2021-01-05 MED ORDER — POTASSIUM CHLORIDE CRYS ER 20 MEQ PO TBCR: 40.0000 meq | EXTENDED_RELEASE_TABLET | Freq: Two times a day (BID) | ORAL | 3 refills | Status: DC

## 2021-01-05 MED ORDER — ATORVASTATIN CALCIUM 20 MG PO TABS: 20.0000 mg | ORAL_TABLET | Freq: Every day | ORAL | 3 refills | Status: DC

## 2021-01-05 NOTE — Progress Notes (Signed)
Fasting labs per Cardiology

## 2021-01-05 NOTE — Telephone Encounter (Signed)
Pt's medications were sent to pt's pharmacy as requested. Confirmation received.  

## 2021-01-05 NOTE — Telephone Encounter (Signed)
Patient last filled atorvastatin 20mg  po daily #90 RF0 03/03/2020.  Had lipid panel drawn today for cardiology and pending new Rx.  Bridge refill planned #90 RF0 and patient to bring new Rx for next fill.

## 2021-01-05 NOTE — Telephone Encounter (Signed)
Pt fills some of his meds through workplace dispensary (no cost). Looking for a new Rxs for:  Amlodipine 5mg  po BID #180 RF3  Potassium Chloride po 2 tabs in AM, 1 tab at lunch, and 2 tabs in PM #450 RF3  Last OV 12/30/20  You may enter pharmacy as [facility] and "No Print" as we will pull Rx from within [facility].   Please advise if any questions or concerns.   Drawing FLP and ALT this afternoon. Once results are back tomorrow, will need new Atorvastatin Rx as well (or new Rx if any changes made).   Thank you!

## 2021-01-06 LAB — LIPID PANEL
Chol/HDL Ratio: 2.7 ratio (ref 0.0–5.0)
Cholesterol, Total: 157 mg/dL (ref 100–199)
HDL: 58 mg/dL (ref >39)
LDL Chol Calc (NIH): 87 mg/dL (ref 0–99)
Triglycerides: 58 mg/dL (ref 0–149)
VLDL Cholesterol Cal: 12 mg/dL (ref 5–40)

## 2021-01-06 LAB — ALT: ALT: 34 IU/L (ref 0–44)

## 2021-01-09 ENCOUNTER — Telehealth: Payer: Self-pay | Admitting: *Deleted

## 2021-01-09 DIAGNOSIS — E7849 Other hyperlipidemia: Secondary | ICD-10-CM

## 2021-01-09 MED ORDER — ATORVASTATIN CALCIUM 40 MG PO TABS: 40.0000 mg | ORAL_TABLET | Freq: Every day | ORAL | 3 refills | Status: DC

## 2021-01-09 NOTE — Telephone Encounter (Signed)
-----   Message from Quintella Reichert, MD sent at 01/07/2021  3:21 PM EST ----- LDL not at goal, increase atorvastatin to 40mg  daily and repeat FLP and ALT in 6 weeks

## 2021-01-09 NOTE — Telephone Encounter (Signed)
Pt has been notified of lab results by phone with verbal understanding. Pt is agreeable to plan of care to increase Atorvastatin to 40 mg daily; Rx has been sent to [facility] per pt request; pt also request repeat labs to be done at [employer] as well, pt place of employment. Pt thanked me for the call and the help in letting him get his lab work done at his job since he goes to work early in the morning around 6 am. I will place orders and send in new Rx. Patient notified of result.  Please refer to phone note from today for complete details.   Danielle Rankin, [facility] 01/09/2021 2:41 PM

## 2021-01-12 NOTE — Telephone Encounter (Signed)
See tcon follow up 10/21/2020

## 2021-01-12 NOTE — Telephone Encounter (Signed)
Weight 249lbs on recheck with RN Rolly Salter 01/03/21 VSS  BP improved on recheck 01/05/21

## 2021-01-12 NOTE — Telephone Encounter (Signed)
Patient saw Dr Mayford Knife 12/30/20 cardiology. "Chest CTA showed a stable 4.4cm ascending aortic aneurysm, small pericardial effusion and small left sided pleural effusion.  Pericardial effusion not seen on recent echo. Get chest MRA in 1 year to followup on small aneurysm": Patient seen in clinic this week less shortness of breath with exertion.

## 2021-01-31 ENCOUNTER — Telehealth: Payer: Self-pay | Admitting: *Deleted

## 2021-01-31 DIAGNOSIS — R5383 Other fatigue: Secondary | ICD-10-CM

## 2021-01-31 DIAGNOSIS — R001 Bradycardia, unspecified: Secondary | ICD-10-CM

## 2021-01-31 NOTE — Telephone Encounter (Signed)
Spoke with pt and wife by phone. RN's call woke him up from nap, however he still sounded short of breath even without activity while lying down. Has not checked pulse/oxygen yet. Strongly advised ER evaluation again. Wife reports she agrees but pt is still hesitant to go. She will continue trying to convince him. Reviewed worsening s/sx that would require ER eval. Wife verbalizes understanding of all recommendations. Will f/u with pt again tomorrow morning.

## 2021-01-31 NOTE — Telephone Encounter (Signed)
Pt of [facility]/Dr Mayford Knife. Ryan Glass seen in clinic this morning. Reports ShOB since yesterday. Also felt weak, tired. Able to speak in short sentences only today. Pulse in clinic now 35-44, irregular. O2 sat 96%, BP 126/78. Also had numbness in left arm yesterday. This resolved overnight. Stroke screen negative currently.  EKG capability unavailable.  Please advise if you would like to see [name] in clinic today or have other recommendations.

## 2021-01-31 NOTE — Telephone Encounter (Signed)
Spoke with pt. He is declining ER at this time. He is still at work. Will check in with him again later this afternoon. He sts if any sx worsen, he will proceed to the ED then.

## 2021-01-31 NOTE — Telephone Encounter (Signed)
Please talk to his wife as well to make sure she understands my recommendations

## 2021-01-31 NOTE — Telephone Encounter (Signed)
Chiropodist and Wellness clinic at employer, [employer]. Myself and NP Albina Billet saw him.

## 2021-01-31 NOTE — Telephone Encounter (Signed)
What clinc was he seen in this am

## 2021-01-31 NOTE — Telephone Encounter (Signed)
Spoke with him again. He left work early. He reports fatigue and ShOB slightly worse than earlier today. Last used inhaler around 1130 this morning. I reiterated need for eval in ED. He continues to decline ED. He wants to eat lunch, rest and sts when his wife arrives home from work this afternoon, they will discuss ED. He is going to look for his pulse oximeter at home and monitor O2 level and manually check pulse. I will call him again around 3:30.

## 2021-01-31 NOTE — Telephone Encounter (Signed)
Late entry spoke with patient in clinic earlier today walk in to discuss symptoms he had yesterday and pick up new bottle of nasal saline as running low at home.  Complained of some shortness of breath with exertion today and palpitations. Didn't check sp02 at home this weekend couldn't find sp02 monitor at home. Has been taking his potassium doesn't feel like his usual low potassium level.  Noticed that he gets fatigued and feeling "off" after his weekly methotrexate orally for rheumatoid arthritis.  Last dose yesterday.  Denied leg swelling; checked today and trace at sock line bilateral lower extremities.  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  Breathing effort/pattern similar to post covid symptoms prior to starting inhaled steroids today. Has not used albuterol MDI recently.  Taking his inhaled steroids BID as prescribed.  Skin warm dry and pink  gait sure and steady.  Recommended patient follow up with cardiology and rheumatology since he has noticed these side effects from methotrexate for a while.  Cardiology today since we do not have EKG capability in clinic.  RN Rolly Salter will send PA Dunn message today for patient with VS and symptoms and to find out if he can be seen today or if other recommendations.  Patient reported yesterday had some left arm symptoms which resolved overnight.  Patient stated he will send rheumatology my chart message as due 3 month follow up appt with them next month also.  Reviewed up to date methotrexate side effects with patient include hypotension, chest pain, pericarditis, pericardial effusion, DVT, fatigue, COPD and post-marketing ARDS, pleuritic chest pain, pneumonia.  Patient did not tolerate low BP when HTN meds titrated in the past two years had fatigue when BP lower than 120s/80s and pulse less than 60.  He was very drowsy and fatigued when pulse in 40s or lower.   Patient denied feeling like falling asleep as previous today but feeling fatigued and dyspnea with exertion.  Breathing improved with rest but still 18-20 after sitting in chair 15 minutes today and talking with clinic staff.  Discussed with patient once we heard from cardiology clinic we would update him on recommendations and if any new or worsening symptoms he was to notify clinic staff.  Patient verbalized understanding information/instructions, agreed with plan of care and had no further questions at this time.  Received instructions from Dr Mayford Knife cardiology patient recommended to go to ER [facility] for evaluation today.  Went to patient workcenter and he was eating snack/taking break.  Feeling better, notified of Dr Mayford Knife recommendations for further work up which I agreed patient needs EKG and labs.  He prefers to see if continues to feel better after finishing his snack.  If not feeling better he stated he will talk to supervisor to go home around 1200 today.   If not feeling better after lunch/rest at home he will go to ER. Asked if patient had albuterol inhaler with him at work.  He did; discussed if shortness of breath to take his albuterol MDI 2 puffs to see if shortness of breath improves also.  Patient A&Ox3 spoke full sentences without difficulty sitting at his desk skin warm dry and pink  Discussed if new or worsening symptoms at work I recommend he goes straight to ER and not go home.  Patient stated he would prefer to go to [facility] ER as typically shorter wait time than [facility]  as he lives in [place].  Discussed with patient any of those ERs can handle a cardiac work up if that is what he prefers.  Patient denied dizzyness, chest pain, headache, n/v/d, visual changes at current time.  Biggest complaint fatigue and dyspnea with exertion and palpitations.  Patient with history of ablation for palpitations and worsening palpitations with intermittent low potassium levels on  supplement typically occurs if he skips doses or medication adjustment was made recently since I have known patient.  Patient verbalized understanding information/instructions and had no further questions at this time.  RN Rolly Salter instructed to follow up with patient this afternoon since NP clinic ends at 1200 onsite.  Reviewed RN Rolly Salter notes in Epic this evening.  Will follow up with patient via telephone tomorrow.

## 2021-01-31 NOTE — Telephone Encounter (Signed)
Given his symptoms and low HR he needs to go to ER

## 2021-01-31 NOTE — Telephone Encounter (Signed)
He really needs to go to ER at [facility] to be evaluated - I do not feel comfortable with him waiting given his SOB and low HR

## 2021-02-01 ENCOUNTER — Telehealth: Payer: Self-pay | Admitting: Cardiology

## 2021-02-01 DIAGNOSIS — I712 Thoracic aortic aneurysm, without rupture, unspecified: Secondary | ICD-10-CM | POA: Insufficient documentation

## 2021-02-01 DIAGNOSIS — I7121 Aneurysm of the ascending aorta, without rupture: Secondary | ICD-10-CM | POA: Insufficient documentation

## 2021-02-01 NOTE — Telephone Encounter (Signed)
Follow up with patient via telephone as unable to view his PCM encounter note in full.  Only that EKG performed but no image or results and diagnosis bradycardia and hypertension.  Patient stated he asked PCM office to send EKG to Dr Mayford Knife as it was full of PVCs.  He tried to upload his paper copy to my chart but it stated file too large.  Patient asked if he requires return to work note and I discussed with patient due to his dizziness, shortness of breath and falling this morning at home that yes he does need a work note to return onsite from [facility] or cardiology.  Patient notified HR fax [phone] and Jasmine December extension [phone].  Patient speaking in phrases during conversation with me this afternoon, A&Ox3 no cough/nasal congestion/throat clearing.  Stating feeling better and wants to return to work tomorrow.  Discussed with patient I cannot clear him as I cannot see his EKG and PCM saw him in office today unable to see PE encounter note in Epic at this time.  Patient stated he was going to call PCM/cardiology again for work note and see if anything had been sent to [employer] as he thought HR fax number [phone].  Discussed with patient RN Rolly Salter in the office tomorrow normal hours and I will be onsite 11-2.  Patient verbalized understanding information/instructions, agreed with plan of care and had no further questions at this time.

## 2021-02-01 NOTE — Telephone Encounter (Signed)
SUspect that his SOB is PVC related and likely the reason for his low HR as well.  Please get him in with PA tomorrow

## 2021-02-01 NOTE — Telephone Encounter (Signed)
Reviewed Epic no new encounters for patient noted overnight.  Patient reported 135/65  And repeat 125/58 BPs this morning.  Pulse 36-45.  Still cannot find his home pulse oximeter.  Denied chest pain.  Still not feeling normal.  Feeling out of breath after shower.  Denied n/v/d/fever/chills.  Patient reported had two cups of coffee this morning instead of just one.  Slept better last night but dizzy when he woke up this morning. I discussed hydrating/slow position changes with patient.  He reported he scheduled appt with PCM at 1120 today.  Spouse at work currently and not going to doctor with him.  Patient reported fell in his bedroom this morning due to dizziness.  Discussed not driving if dizzy.  Ate breakfast this morning then took nap.  Feeling a little better now.  Discussed with patient he needs to have evaluation EKG and labs today.  [facility] closed and I am calling him from home.  I will check in with him later this evening again.  Patient spoke full sentences without difficulty respiration rate normal for patient on call today.  No cough/nasal sniffing/congestion or throat clearing.  Patient A&Ox3.  Duration of telephone call 6 minutes. Patient verbalized understanding information/instructions, agreed with plan of care and had no further questions at this time.  HR notified patient personal medical and needs clearance from [facility] to return to work.

## 2021-02-01 NOTE — Telephone Encounter (Signed)
Pt c/o Shortness Of Breath: STAT if SOB developed within the last 24 hours or pt is noticeably SOB on the phone  1. Are you currently SOB (can you hear that pt is SOB on the phone)? Yes I can hear the patient   2. How long have you been experiencing SOB? yesterday  3. Are you SOB when sitting or when up moving around? both  4. Are you currently experiencing any other symptoms? Weak patient went to pcp and they stated that he has PVC.

## 2021-02-01 NOTE — Telephone Encounter (Signed)
The patient went to his PCP today for SOB and bradycardia, an EKG was completed that showed "lots of PVC's" per patient. (he is going to get them to fax this over). They told him to call our office and let us know what is going on. The patient said he is a little short of breath which started a week ago, this is all the time. From PCP note heart rate was 70 at the in office visit and blood pressure 139/80 at 11:40 this am. 105/66 82 current. Patient states HR at home has been in the 30's- 40's at times and he started noticing this yesterday. Yesterday he has had some dizziness and lightheadedness. He has not missed any dose of mediation and the only new medication that he has is a inhaler.    Patient states he needs a doctors note stating it is safe for him to return to work. 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   Will forward to Dr. Mayford Knife and her RN for advisement. ED precautions given.

## 2021-02-02 ENCOUNTER — Encounter: Payer: Self-pay | Admitting: Cardiology

## 2021-02-02 ENCOUNTER — Other Ambulatory Visit: Payer: Self-pay

## 2021-02-02 ENCOUNTER — Ambulatory Visit (INDEPENDENT_AMBULATORY_CARE_PROVIDER_SITE_OTHER): Payer: No Typology Code available for payment source | Admitting: Cardiology

## 2021-02-02 ENCOUNTER — Ambulatory Visit: Payer: Self-pay | Admitting: *Deleted

## 2021-02-02 ENCOUNTER — Ambulatory Visit (INDEPENDENT_AMBULATORY_CARE_PROVIDER_SITE_OTHER): Payer: No Typology Code available for payment source

## 2021-02-02 VITALS — BP 138/76 | HR 50 | Wt 251.0 lb

## 2021-02-02 VITALS — BP 128/96 | HR 68 | Ht 68.0 in | Wt 255.0 lb

## 2021-02-02 DIAGNOSIS — I493 Ventricular premature depolarization: Secondary | ICD-10-CM | POA: Diagnosis not present

## 2021-02-02 DIAGNOSIS — E7849 Other hyperlipidemia: Secondary | ICD-10-CM | POA: Diagnosis not present

## 2021-02-02 DIAGNOSIS — I712 Thoracic aortic aneurysm, without rupture, unspecified: Secondary | ICD-10-CM

## 2021-02-02 DIAGNOSIS — R0602 Shortness of breath: Secondary | ICD-10-CM

## 2021-02-02 DIAGNOSIS — R5383 Other fatigue: Secondary | ICD-10-CM

## 2021-02-02 DIAGNOSIS — R072 Precordial pain: Secondary | ICD-10-CM

## 2021-02-02 DIAGNOSIS — I10 Essential (primary) hypertension: Secondary | ICD-10-CM

## 2021-02-02 DIAGNOSIS — R001 Bradycardia, unspecified: Secondary | ICD-10-CM

## 2021-02-02 LAB — BASIC METABOLIC PANEL
BUN/Creatinine Ratio: 23 (ref 10–24)
BUN: 26 mg/dL (ref 8–27)
CO2: 26 mmol/L (ref 20–29)
Calcium: 10.1 mg/dL (ref 8.6–10.2)
Chloride: 104 mmol/L (ref 96–106)
Creatinine, Ser: 1.11 mg/dL (ref 0.76–1.27)
GFR calc Af Amer: 81 mL/min/{1.73_m2} (ref >59)
GFR calc non Af Amer: 70 mL/min/{1.73_m2} (ref >59)
Glucose: 87 mg/dL (ref 65–99)
Potassium: 3.6 mmol/L (ref 3.5–5.2)
Sodium: 137 mmol/L (ref 134–144)

## 2021-02-02 LAB — CBC
Hematocrit: 41.7 % (ref 37.5–51.0)
Hemoglobin: 14.2 g/dL (ref 13.0–17.7)
MCH: 33.3 pg — ABNORMAL HIGH (ref 26.6–33.0)
MCHC: 34.1 g/dL (ref 31.5–35.7)
MCV: 98 fL — ABNORMAL HIGH (ref 79–97)
Platelets: 178 10*3/uL (ref 150–450)
RBC: 4.26 x10E6/uL (ref 4.14–5.80)
RDW: 14.3 % (ref 11.6–15.4)
WBC: 7.2 10*3/uL (ref 3.4–10.8)

## 2021-02-02 LAB — D-DIMER, QUANTITATIVE: D-DIMER: 0.3 mg/L FEU (ref 0.00–0.49)

## 2021-02-02 LAB — TROPONIN T: Troponin T (Highly Sensitive): 26 ng/L (ref 0–22)

## 2021-02-02 NOTE — Telephone Encounter (Signed)
See RN encounter 2/24. P 45-50 today, irregular. Pt has appt with cardiology this afternoon. He is leaving work now to rest prior to appt as fatigue and ShOB is persistent. Denies chest pain, dizziness.

## 2021-02-02 NOTE — Progress Notes (Signed)
EKG review from pcp office yesterday. Multiple PVCs noted. Vitals check. Pt going home after this to rest prior to Cardiology appt this afternoon.

## 2021-02-02 NOTE — Telephone Encounter (Signed)
Patient seen in clinic today as he was leaving RN appt.  Still reporting dyspnea with exertion but less dizzy today.  Still having palpitations.  Heart rate was low again this am and worsened after taking his medication.  He has log of BP and pulses to show Dr Mayford Knife from yesterday and today at home.  He also had copy of his PCM EKGs (multiple). Patient denied falling since yesterday am and came into work at normal time today but considering going home a little early to rest prior to appt with Dr Mayford Knife, cardiology this afternoon.  Leg swelling same as Tuesday trace nonpitting.  RN Rolly Salter weighed patient today also see RN note.  Manual pulse check 40s by me.  PERRL A&Ox3 speaking full sentences no cough strength extremities 5/5 in/out of chair without difficulty skin warm dry and pink.  Discussed with patient to ask cardiology if he should hold any medicines if pulse below 50 or if BP below specified parameters.  Patient asked if he will need another ablation.  Discussed with patient I do not know but Dr Mayford Knife will do her work up today possibly labs with his history of hypokalemia/worsening bradycardia/palpitations PVCs on EKG. Discussed I would follow up with him via telephone this afternoon or evening. Patient verbalized understanding information/instructions and had no further questions at this time.

## 2021-02-02 NOTE — Patient Instructions (Signed)
Medication Instructions:  Your physician recommends that you continue on your current medications as directed. Please refer to the Current Medication list given to you today.  *If you need a refill on your cardiac medications before your next appointment, please call your pharmacy*   Lab Work: TODAY: BMET, BNP, D-DIMER, TSH, CBC, TROPONIN T  If you have labs (blood work) drawn today and your tests are completely normal, you will receive your results only by: Marland Kitchen MyChart Message (if you have MyChart) OR . A paper copy in the mail If you have any lab test that is abnormal or we need to change your treatment, we will call you to review the results.   Testing/Procedures: Your physician has requested that you have an echocardiogram in 1 week. Echocardiography is a painless test that uses sound waves to create images of your heart. It provides your doctor with information about the size and shape of your heart and how well your heart's chambers and valves are working. This procedure takes approximately one hour. There are no restrictions for this procedure.  Your physician has recommended that you wear an event monitor. Event monitors are medical devices that record the heart's electrical activity. Doctors most often [place] these monitors to diagnose arrhythmias. Arrhythmias are problems with the speed or rhythm of the heartbeat. The monitor is a small, portable device. You can wear one while you do your normal daily activities. This is usually used to diagnose what is causing palpitations/syncope (passing out).  Your physician has requested that you have a lexiscan myoview. For further information please visit [email or web address] Please follow instruction sheet, as given.     Follow-Up: At [facility], you and your health needs are our priority.  As part of our continuing mission to provide you with exceptional heart care, we have created designated Provider Care Teams.  These Care Teams include  your primary Cardiologist (physician) and Advanced Practice Providers (APPs -  Physician Assistants and Nurse Practitioners) who all work together to provide you with the care you need, when you need it.  We recommend signing up for the patient portal called "MyChart".  Sign up information is provided on this After Visit Summary.  MyChart is used to connect with patients for Virtual Visits (Telemedicine).  Patients are able to view lab/test results, encounter notes, upcoming appointments, etc.  Non-urgent messages can be sent to your provider as well.   To learn more about what you can do with MyChart, go to [email or web address].    Your next appointment:   6 month(s)  The format for your next appointment:   In Person  Provider:   [name], MD   Other Instructions You have been referred to see an electrophysiologist.

## 2021-02-02 NOTE — Addendum Note (Signed)
Addended by: Theresia Majors on: 02/02/2021 04:43 PM   Modules accepted: Orders

## 2021-02-02 NOTE — Telephone Encounter (Signed)
Spoke with the patient who states that his heart rate has still been running low still. Has been in the low 40's all morning. He states that he is still short of breath. He denies any chest pain. He does have some lightheadedness at times but does not feel like he is going to pass out. Blood pressures have been in the 120's/60's. I have scheduled the patient with Dr. Mayford Knife today at 4:00pm. Advised patient if symptoms worsen before then, then he needs to go to the ER. Patient verbalized understanding.

## 2021-02-02 NOTE — Progress Notes (Signed)
Date:  02/02/2021   ID:  Ryan Glass, DOB 03-23-1956, MRN [id number]  PCP:  [name], MD  Cardiologist:  [name], MD Electrophysiologist:  [name], MD   Chief Complaint:  PVCs  History of Present Illness:    Ryan Glass is a 65 y.o. 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 Ladona Ridgel, dilated aortic root, hypertension, lower extremity edema and chest pain.   Event monitor done for palpitations showed frequent PVCs and 24-hour Holter showed 22% PVC load. He was referred  to EP who felt that the PVCs were originating to the lateral wall of the inferior apex of the RV.  He underwent successful PVC ablation in November 2019. He also has OSA and is on PAP.   He had some problems at one point with fatigue and bradycardia(per patient but felt to be due to nonperfusing PVCs) and stopped all his meds and was seen in the office with EKG showing NSR with bigeminal PVCs and BP was running high as well.  Recently he has been on Bystolic 10mg  daily.   I saw him last in Jan 2022 and was having problems with  SOB after having COVID 19 in August.  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  He also has RA.  He was started on Flovent.  PFTs showed a mild decrease in DLCO felt related to COVID infection.   He had a pleural effusion on the left by CT in Dec 2021.   His pulmonologist wanted to see how he did on inhalers and possibly get HRCT.    Today he is here because recently he has had more problems with DOE and severe exertional fatigue to the point he really cannot do much in the way of walking or very little exertion.  He also has noticed frequent palpitations as well as low HR in the 30's at times.  He saw his PCP yesterday and EKG showed NSR with very frequent PVCs all of which appeared to be coming from the RVOT.  He also had an episode of left arm pain 2 days ago while sitting that lasted a few hours and resolved on its own.   He did not have chest pain with this but has had some intermittent right sided chest pain with no radiation.    Prior CV studies:   The following studies were reviewed today:  Chest CTA 11/2020  Past Medical History:  Diagnosis Date  . Ascending aortic aneurysm (HCC)    4.4cm on CTA 11/2020  . Bilateral lower extremity edema   . Chronic gout without tophus    followed by dr 12/2020    (06-08-2020  per pt last episode right knee 3 wks ago)  . CKD (chronic kidney disease), stage II    nephrology--- 06-10-2020 [place] (10-29-2019 note in epic scanned in  media)  . Hypertension    followed by cardiology, dr t. Yuniel Blaney   (05-14-2018 nuclear study in epic , normal perfusion with nuclear ef 61%)  . Left hydrocele   . OSA on CPAP    per pt uses every night  . Palpitations followed by dr t. 07-14-2018   06-08-2020  still feels palipations due to PVCs when exertion but not with chest pain/ discomfort  . PVC's (premature ventricular contractions) cardiologist--- dr t. Dyamond Tolosa   Status post PVC ablation by Dr. 06-10-2020 2019 with recurrence of frequent PVCs/bigeminy;  prior pseudobradycardia r/t pvcs  . Rheumatoid arthritis involving multiple sites [facility])    rheumotology--- dr a. Sharmon Revere  ([facility] in [place])   Past Surgical History:  Procedure Laterality Date  . HYDROCELE EXCISION Left 06/14/2020   Procedure: LEFT  HYDROCELECTOMY ADULT;  Surgeon: Noel Christmas, MD;  Location: [facility];  Service: Urology;  Laterality: Left;  . INCISIONAL HERNIA REPAIR  02-23-2016      LAPAROSCOPIC  . LAPAROSCOPIC INGUINAL HERNIA REPAIR Bilateral 08-22-2015     AND UMBILICAL HERNIA REPAIR  . PVC ABLATION N/A 10/07/2018   Procedure: PVC ABLATION;  Surgeon: Marinus Maw, MD;  Location: [facility] INVASIVE CV LAB;  Service: Cardiovascular;  Laterality: N/A;  . UMBILICAL HERNIA REPAIR  child     Current Meds  Medication Sig  . acetaminophen-codeine (TYLENOL #3) 300-30 MG tablet 1-2 tablets as  needed.   Marland Kitchen albuterol (VENTOLIN HFA) 108 (90 Base) MCG/ACT inhaler Inhale 2 puffs into the lungs every 4 (four) hours as needed for wheezing or shortness of breath.  Marland Kitchen amLODipine (NORVASC) 5 MG tablet Take 1 tablet (5 mg total) by mouth 2 (two) times daily.  Marland Kitchen atorvastatin (LIPITOR) 40 MG tablet Take 1 tablet (40 mg total) by mouth daily.  . chlorthalidone (HYGROTON) 25 MG tablet TAKE ONE TABLET BY MOUTH DAILY  . colchicine 0.6 MG tablet Take 1-2 tablets by mouth daily as needed (gout).   Marland Kitchen diclofenac (VOLTAREN) 75 MG EC tablet Take 75 mg by mouth 2 (two) times daily.  Elgie Collard SURECLICK 50 MG/ML injection Inject 50 mg into the skin once a week.   . febuxostat (ULORIC) 40 MG tablet Take 1 tablet by mouth daily.  . fluticasone (FLONASE) 50 MCG/ACT nasal spray SPRAY ONE SPRAY IN EACH NOSTRIL TWICE DAILY  . Fluticasone-Salmeterol (ADVAIR) 250-50 MCG/DOSE AEPB Inhale 1 puff into the lungs every 12 (twelve) hours.  . folic acid (FOLVITE) 1 MG tablet Take 1 tablet by mouth daily.  Marland Kitchen gabapentin (NEURONTIN) 600 MG tablet Take 600 mg by mouth 3 (three) times daily.   . hydrALAZINE (APRESOLINE) 100 MG tablet Take 1 tablet (100 mg total) by mouth 3 (three) times daily.  Marland Kitchen loratadine (CLARITIN) 10 MG tablet Take 1 tablet (10 mg total) by mouth daily.  Marland Kitchen losartan (COZAAR) 100 MG tablet Take 1 tablet (100 mg total) by mouth daily. Please keep upcoming appt in January 2022 with Dr. Mayford Knife before anymore refills. Thank you  . methotrexate 2.5 MG tablet Take 15 mg by mouth once a week.  . montelukast (SINGULAIR) 10 MG tablet TAKE ONE TABLET BY MOUTH EVERY NIGHT AT BEDTIME  . nebivolol (BYSTOLIC) 5 MG tablet Take 2 tablets (10 mg total) by mouth at bedtime.  . potassium chloride SA (KLOR-CON M20) 20 MEQ tablet Take 2 tablets (40 mEq total) by mouth 2 (two) times daily. Take 1 tablet by mouth daily at lunch.  . predniSONE (DELTASONE) 5 MG tablet Take 10 mg by mouth daily.   . sildenafil (REVATIO) 20 MG tablet Take  1 tablet by mouth daily as needed.     Allergies:   Amlodipine besylate and Aspirin   Social History   Tobacco Use  . Smoking status: Never Smoker  . Smokeless tobacco: Never Used  Vaping Use  . Vaping Use: Never used  Substance Use Topics  . Alcohol use: Yes    Alcohol/week: 5.0 - 7.0 standard drinks    Types: 5 - 7 Cans of beer per week  . Drug  use: Never     Family Hx: The patient's family history includes Cardiomyopathy in his mother; Heart attack in his father.  ROS:   Please see the history of present illness.     All other systems reviewed and are negative.   Labs/Other Tests and Data Reviewed:    Recent Labs: 10/10/2020: Hemoglobin 13.8; Platelets 169 12/01/2020: BUN 16; Creatinine, Ser 0.92; Magnesium 1.9; Potassium 3.9; Sodium 140 01/05/2021: ALT 34   Recent Lipid Panel Lab Results  Component Value Date/Time   CHOL 157 01/05/2021 02:09 PM   TRIG 58 01/05/2021 02:09 PM   HDL 58 01/05/2021 02:09 PM   CHOLHDL 2.7 01/05/2021 02:09 PM   LDLCALC 87 01/05/2021 02:09 PM    Wt Readings from Last 3 Encounters:  02/02/21 251 lb (113.9 kg)  01/03/21 249 lb (112.9 kg)  12/30/20 257 lb 9.6 oz (116.8 kg)     Objective:    Vital Signs:  There were no vitals taken for this visit.   GEN: Well nourished, well developed in no acute distress HEENT: Normal NECK: No JVD; No carotid bruits LYMPHATICS: No lymphadenopathy CARDIAC:RRR, no murmurs, rubs, gallops RESPIRATORY:  Clear to auscultation without rales, wheezing or rhonchi  ABDOMEN: Soft, non-tender, non-distended MUSCULOSKELETAL:  No edema; No deformity  SKIN: Warm and dry NEUROLOGIC:  Alert and oriented x 3 PSYCHIATRIC:  Normal affect    ASSESSMENT & PLAN:    1.  PVCs -s/p PVC ablation  -PVCs reoccurred and are from different focus than pre ablation -Holter monitor showed PVC load at 8% -EP recommended staying on BB -recently having more palpitations and PVCs noted at PCP office and EKG confirms this  today -I will repeat a 3 day ziopatch to assess PVC load -continue Bystolic 10mg  daily -he is having a lot of PVCs on EKGs from his PCP all of which appear to be coming from RVOT -I will refer back to Dr. as he is very symptomatic and may need repeat ablation  2.  HTN -BP adequately controlled -continue Bystolic 10mg  daily, Hydralazine 100mg  TID, Chlorthalidone 25mg  daily, Amlodipine 10mg  daily, Losartan 100mg  daily -SCr stable at 0.92 in Dec 2021  3.  Ascending aortic aneurysm/Aortic atherosclerosis -4.4cm by Chest CT  11/2020 -repeat chest CTA to assess for stability 11/2021 -BP adequately controlled -continue statin  4.  HLD -LDL goal < 70 due to vascular disease -continue atorvastatin 20mg  daily -LDL was 87 in January and Atorvastatin was increased to 40mg  daily and he has FLP and ALT pending  5.  DOE -? related to COVID 19 infection with ILD  -followed by Pulmonary and improved with inhalers  -pulmonary considered getting HRCT if sx persist -DOE has worsened and ? If related to PVCs or other etiology -will check BMET, CBC, TSH, DDimer, hsTrop and BNP today -repeat 2D echo to make sure he has not developed a PVC induced DCM -Lexiscan myoview to rule out ischemia>>cannot do coronary CTA due to PVCs -Shared Decision Making/Informed Consent The risks [chest pain, shortness of breath, cardiac arrhythmias, dizziness, blood pressure fluctuations, myocardial infarction, stroke/transient ischemic attack, nausea, vomiting, allergic reaction, radiation exposure, metallic taste sensation and life-threatening complications (estimated to be 1 in 10,000)], benefits (risk stratification, diagnosing coronary artery disease, treatment guidance) and alternatives of a nuclear stress test were discussed in detail with Mr. Ryle and he agrees to proceed.  Medication Adjustments/Labs and Tests Ordered: Current medicines are reviewed at length with the patient today.  Concerns regarding  medicines are outlined above.  Tests Ordered: Orders Placed This Encounter  Procedures  . EKG 12-Lead   Medication Changes: No orders of the defined types were placed in this encounter.   Disposition:  followup with me in 1 year  Signed, [name], MD  02/02/2021 4:12 PM    [facility]

## 2021-02-03 ENCOUNTER — Telehealth: Payer: Self-pay

## 2021-02-03 ENCOUNTER — Telehealth: Payer: Self-pay | Admitting: Cardiology

## 2021-02-03 ENCOUNTER — Other Ambulatory Visit: Payer: No Typology Code available for payment source | Admitting: *Deleted

## 2021-02-03 DIAGNOSIS — R0602 Shortness of breath: Secondary | ICD-10-CM

## 2021-02-03 DIAGNOSIS — I10 Essential (primary) hypertension: Secondary | ICD-10-CM

## 2021-02-03 DIAGNOSIS — R072 Precordial pain: Secondary | ICD-10-CM

## 2021-02-03 LAB — TSH: TSH: 0.897 u[IU]/mL (ref 0.450–4.500)

## 2021-02-03 LAB — PRO B NATRIURETIC PEPTIDE: NT-Pro BNP: 389 pg/mL — ABNORMAL HIGH (ref 0–210)

## 2021-02-03 LAB — TROPONIN T: Troponin T (Highly Sensitive): 28 ng/L (ref 0–22)

## 2021-02-03 MED ORDER — FUROSEMIDE 40 MG PO TABS: 40.0000 mg | ORAL_TABLET | Freq: Every day | ORAL | 3 refills | Status: DC

## 2021-02-03 NOTE — Telephone Encounter (Signed)
The patient has been notified of the result and verbalized understanding.  All questions (if any) were answered. Theresia Majors, RN 02/03/2021 1:58 PM  Patient will stop chlorthalidone and start lasix 40mg  BID for two days then once daily. He will come in for repeat troponin today and repeat BNP, BMET on Monday.

## 2021-02-03 NOTE — Telephone Encounter (Signed)
Patient returned call stated he was told to stop chlorthalidone and start furosemide twice today and tomorrow then once a day.  He has been peeing more today.  Used his home blood pressure machine during telephone call BP 127/66 pulse 90 Patient reported he is still having palpitations.  Patient encouraged to take manual pulse at least once a day and log.  Patient stated his wife will check it for him this weekend.  Some dizziness if he bends over then stands up today.  Discussed with patient he will be urinating more than usual with furosemide and to make slow position changes e.g. lying to sitting then standing slowly this weekend as diuretic will lower blood pressure.  Patient has follow up labs/stress test/imaging scheduled 28 Feb, 1 & 2 Mar all am appts.  He is planning to stay home from work since Dr Mayford Knife stated she needed to see how his labs respond to medication changes before going back to work.  Patient reported still a little short of breath this afternoon.  Patient understood his troponin and pro-BNP elevated.  Patient understands risks of cardiac stress test and is awaiting delivery of his zio patch via mail to apply for monitoring.  Patient A&Ox3 respirations even, spoke full sentences at times and others short phrases.  Still cannot find his home sp02 monitor thinking about buying a new one this weekend since his other one is lost.  Patient had more labs drawn today at cardiology.  Patient asked me to look in epic to see if any work note/return to work note available from Dr Mayford Knife.  I did not see one at this time.  Patient verbalized understanding and had no further questions at this time.  Discussed I will check in with him again this weekend to see if symptoms improving/new or worsening.  Patient stated Dr Mayford Knife was waiting to see what his new lab results showed before writing return to work note.  Discussed with patient I would notify HR he has follow up appts next week with cardiology and work  note pending.  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  Patient verbalized understanding information/instructions, agreed with plan of care and had no further questions at this time.

## 2021-02-03 NOTE — Telephone Encounter (Signed)
Ryan Glass with Dr. Rudene Re office is returning a call to Dr. Norris Cross nurse regarding an MRI request.  She states the patient has not had an MRI with them. However, they are able to fax an EKG report if needed. Please return call to Dr. Rudene Re office to discuss at [phone].

## 2021-02-03 NOTE — Telephone Encounter (Signed)
-----   Message from Quintella Reichert, MD sent at 02/03/2021 12:41 PM EST ----- BNP elevated so DOE may be related to acute CHF - please stop chlorthalidone and change to Lasix 40mg  BID for 2 days and then 40mg  daily.  BMET and BNP on Monday. Please get him back in today for a repeat hsTrop as the first on was minimally elevated but likely related to CHF

## 2021-02-03 NOTE — Telephone Encounter (Signed)
Telephone message left for patient potassium, electrolytes, kidney function, blood sugar, thyroid normal.  ProBNP and troponin elevated patient to follow up with Dr Mayford Knife regarding interpretation of these labs.  Calling to see how he is feeling this morning and if he has zio patch on for monitoring or still waiting for delivery.

## 2021-02-03 NOTE — Telephone Encounter (Signed)
Left message for Ryan Glass letting her know that EKG was received and that we did not need MRI results.

## 2021-02-04 ENCOUNTER — Other Ambulatory Visit: Payer: Self-pay | Admitting: Registered Nurse

## 2021-02-04 DIAGNOSIS — J301 Allergic rhinitis due to pollen: Secondary | ICD-10-CM

## 2021-02-04 LAB — BASIC METABOLIC PANEL
BUN/Creatinine Ratio: 17 (ref 10–24)
BUN: 22 mg/dL (ref 8–27)
CO2: 21 mmol/L (ref 20–29)
Calcium: 10 mg/dL (ref 8.6–10.2)
Chloride: 102 mmol/L (ref 96–106)
Creatinine, Ser: 1.32 mg/dL — ABNORMAL HIGH (ref 0.76–1.27)
GFR calc Af Amer: 65 mL/min/{1.73_m2} (ref >59)
GFR calc non Af Amer: 57 mL/min/{1.73_m2} — ABNORMAL LOW (ref >59)
Glucose: 136 mg/dL — ABNORMAL HIGH (ref 65–99)
Potassium: 3.6 mmol/L (ref 3.5–5.2)
Sodium: 139 mmol/L (ref 134–144)

## 2021-02-04 LAB — PRO B NATRIURETIC PEPTIDE: NT-Pro BNP: 141 pg/mL (ref 0–210)

## 2021-02-05 ENCOUNTER — Encounter: Payer: Self-pay | Admitting: Registered Nurse

## 2021-02-05 NOTE — Telephone Encounter (Signed)
Received pharmacy request for singulair 10mg  po qhs refill for patient.  Electronic refill sent to patient pharmacy of choice #90 [id number].  Reviewed possible adverse reactions as patient with recently worsening palpitations and noted post-marketing that palpitations were reported by patients but no percentage listed in up to date for number of patients reporting this side effect.  Will notify patient as may want to consider stopping singulair to see if palpitations improve.  Last filled 05/27/2020 and initiated 2018 patient taking prn/seasonally not year round.

## 2021-02-05 NOTE — Telephone Encounter (Signed)
Patient contacted via telephone and he reported he has lost 6lbs taking furosemide.  Patient is taking his potassium chloride 2 am 1 lunch and 2 evening consistently every day this weekend.  Patient reported in the past sometimes he would accidentally skip a dose here or there.  Patient reported he is breathing easier now since starting furosemide also.  Patient reported did BID dosing x 2 days and now switching to once a day per his cardiologist Dr Mayford Knife.  Patient reported pulse 75 138/85 yesterday; today 152/89 pulse 53 am; 112/79 pulse 83; 8pm 143/75 pulse 80  Patient A&Ox3 respirations even and unlabored spoke full sentences without difficulty; no cough/nasal congestion/sniffing or throat clearing noted during 9 minute telephone call.  Discussed with patient HR notified he would not be in at beginning of shift due to doctor follow up tests tomorrow.  Patient to notify supervisor/HR if not cleared to return to work tomorrow as on Friday provider stated to patient she would need follow up lab results to determine when he was returning to work.  Patient verbalized understanding information/instructions, agreed with plan of care and had no further questions at this time.

## 2021-02-06 ENCOUNTER — Other Ambulatory Visit: Payer: Self-pay

## 2021-02-06 ENCOUNTER — Telehealth (HOSPITAL_COMMUNITY): Payer: Self-pay

## 2021-02-06 ENCOUNTER — Other Ambulatory Visit: Payer: No Typology Code available for payment source | Admitting: *Deleted

## 2021-02-06 ENCOUNTER — Telehealth: Payer: Self-pay | Admitting: Cardiology

## 2021-02-06 DIAGNOSIS — Z79899 Other long term (current) drug therapy: Secondary | ICD-10-CM

## 2021-02-06 NOTE — Telephone Encounter (Signed)
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

## 2021-02-06 NOTE — Telephone Encounter (Signed)
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

## 2021-02-06 NOTE — Telephone Encounter (Signed)
See newer telephone consults and office visits with cardiology.

## 2021-02-06 NOTE — Telephone Encounter (Signed)
Letter has been faxed.

## 2021-02-06 NOTE — Telephone Encounter (Signed)
Patient wanted to provide a fax number for Dr. Mayford Knife to fax the work letter to his office.  Please fax paperwork to: [phone]

## 2021-02-06 NOTE — Telephone Encounter (Signed)
Spoke with the patient, detailed instructions given. He stated that he would be here for his test. Asked to call back with any questions. S.Romi Rathel EMTP 

## 2021-02-07 ENCOUNTER — Ambulatory Visit (HOSPITAL_COMMUNITY): Payer: No Typology Code available for payment source | Attending: Internal Medicine

## 2021-02-07 ENCOUNTER — Telehealth: Payer: Self-pay | Admitting: *Deleted

## 2021-02-07 ENCOUNTER — Telehealth: Payer: Self-pay

## 2021-02-07 DIAGNOSIS — R002 Palpitations: Secondary | ICD-10-CM

## 2021-02-07 DIAGNOSIS — R072 Precordial pain: Secondary | ICD-10-CM

## 2021-02-07 DIAGNOSIS — Z79899 Other long term (current) drug therapy: Secondary | ICD-10-CM

## 2021-02-07 DIAGNOSIS — I10 Essential (primary) hypertension: Secondary | ICD-10-CM

## 2021-02-07 LAB — MYOCARDIAL PERFUSION IMAGING
LV dias vol: 154 mL (ref 62–150)
LV sys vol: 68 mL
Peak HR: 72 {beats}/min
Rest HR: 56 {beats}/min
SDS: 0
SRS: 0
SSS: 0
TID: 0.95

## 2021-02-07 LAB — BASIC METABOLIC PANEL
BUN/Creatinine Ratio: 20 (ref 10–24)
BUN: 27 mg/dL (ref 8–27)
CO2: 20 mmol/L (ref 20–29)
Calcium: 10.1 mg/dL (ref 8.6–10.2)
Chloride: 102 mmol/L (ref 96–106)
Creatinine, Ser: 1.37 mg/dL — ABNORMAL HIGH (ref 0.76–1.27)
Glucose: 92 mg/dL (ref 65–99)
Potassium: 3.6 mmol/L (ref 3.5–5.2)
Sodium: 143 mmol/L (ref 134–144)
eGFR: 58 mL/min/{1.73_m2} — ABNORMAL LOW (ref >59)

## 2021-02-07 LAB — PRO B NATRIURETIC PEPTIDE: NT-Pro BNP: 47 pg/mL (ref 0–210)

## 2021-02-07 MED ORDER — TECHNETIUM TC 99M TETROFOSMIN IV KIT
30.9000 | PACK | Freq: Once | INTRAVENOUS | Status: AC | PRN
  Administered 2021-02-07: 30.9 via INTRAVENOUS
  Filled 2021-02-07: qty 31

## 2021-02-07 MED ORDER — FUROSEMIDE 20 MG PO TABS: 20.0000 mg | ORAL_TABLET | Freq: Every day | ORAL | 3 refills | Status: DC

## 2021-02-07 MED ORDER — TECHNETIUM TC 99M TETROFOSMIN IV KIT
10.4000 | PACK | Freq: Once | INTRAVENOUS | Status: AC | PRN
  Administered 2021-02-07: 10.4 via INTRAVENOUS
  Filled 2021-02-07: qty 11

## 2021-02-07 MED ORDER — REGADENOSON 0.4 MG/5ML IV SOLN
0.4000 mg | Freq: Once | INTRAVENOUS | Status: AC
  Administered 2021-02-07: 0.4 mg via INTRAVENOUS

## 2021-02-07 NOTE — Telephone Encounter (Signed)
-----   Message from Quintella Reichert, MD sent at 02/07/2021 12:47 PM EST ----- BNP has now normalized but renal function has bumped - please decrease Lasix to 20mg  daily and repeat BMET in 1 week

## 2021-02-07 NOTE — Telephone Encounter (Signed)
The patient has been notified of the result and verbalized understanding.  All questions (if any) were answered. Theresia Majors, RN 02/07/2021 2:42 PM  Patient will decrease Lasix to 20 mg daily. Repeat BMET in one week.

## 2021-02-08 ENCOUNTER — Encounter: Payer: Self-pay | Admitting: *Deleted

## 2021-02-08 ENCOUNTER — Other Ambulatory Visit: Payer: Self-pay

## 2021-02-08 ENCOUNTER — Ambulatory Visit (HOSPITAL_COMMUNITY): Payer: No Typology Code available for payment source | Attending: Cardiology

## 2021-02-08 DIAGNOSIS — R0602 Shortness of breath: Secondary | ICD-10-CM

## 2021-02-08 DIAGNOSIS — I10 Essential (primary) hypertension: Secondary | ICD-10-CM

## 2021-02-08 DIAGNOSIS — I712 Thoracic aortic aneurysm, without rupture, unspecified: Secondary | ICD-10-CM

## 2021-02-08 DIAGNOSIS — E7849 Other hyperlipidemia: Secondary | ICD-10-CM | POA: Diagnosis present

## 2021-02-08 DIAGNOSIS — R072 Precordial pain: Secondary | ICD-10-CM | POA: Diagnosis present

## 2021-02-08 DIAGNOSIS — I493 Ventricular premature depolarization: Secondary | ICD-10-CM | POA: Diagnosis present

## 2021-02-08 LAB — ECHOCARDIOGRAM COMPLETE
Area-P 1/2: 3.33 cm2
S' Lateral: 3.4 cm

## 2021-02-08 NOTE — Telephone Encounter (Signed)
Noted patient completed first two cardiology appts this week and plans to work half days as less shortness of breath.  Will review Epic and see patient in clinic 02/09/2021

## 2021-02-08 NOTE — Telephone Encounter (Signed)
Left message for patient reviewed Epic he is to have follow up BMET in 1 week and decrease furosemide to 20mg  po daily.  Potassium normal.  Slight bump in Cr and GFR will monitor trend next week.  Left message for patient he still needs to drink water do not avoid it entirely.  Nuclear stress test normal results.  Echo done today still waiting for Dr review.  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  Patient to see me tomorrow in clinic.  Patient to call me today if any urgent questions or concerns to [phone]

## 2021-02-09 ENCOUNTER — Encounter: Payer: Self-pay | Admitting: Registered Nurse

## 2021-02-09 ENCOUNTER — Ambulatory Visit: Payer: Self-pay | Admitting: Registered Nurse

## 2021-02-09 VITALS — HR 61 | Resp 16 | Wt 249.0 lb

## 2021-02-09 NOTE — Patient Instructions (Signed)
Palpitations Palpitations are feelings that your heartbeat is irregular or is faster than normal. It may feel like your heart is fluttering or skipping a beat. Palpitations are usually not a serious problem. They may be caused by many things, including smoking, caffeine, alcohol, stress, and certain medicines or drugs. Most causes of palpitations are not serious. However, some palpitations can be a sign of a serious problem. You may need further tests to rule out serious medical problems. Follow these instructions at home: Pay attention to any changes in your condition. Take these actions to help manage your symptoms: Eating and drinking  Avoid foods and drinks that may cause palpitations. These may include: ? Caffeinated coffee, tea, soft drinks, diet pills, and energy drinks. ? Chocolate. ? Alcohol. Lifestyle  Take steps to reduce your stress and anxiety. Things that can help you relax include: ? Yoga. ? Mind-body activities, such as deep breathing, meditation, or using words and images to create positive thoughts (guided imagery). ? Physical activity, such as swimming, jogging, or walking. Tell your health care provider if your palpitations increase with activity. If you have chest pain or shortness of breath with activity, do not continue the activity until you are seen by your health care provider. ? Biofeedback. This is a method that helps you learn to use your mind to control things in your body, such as your heartbeat.  Do not use drugs, including cocaine or ecstasy. Do not use marijuana.  Get plenty of rest and sleep. Keep a regular bed time. General instructions  Take over-the-counter and prescription medicines only as told by your health care provider.  Do not use any products that contain nicotine or tobacco, such as cigarettes and e-cigarettes. If you need help quitting, ask your health care provider.  Keep all follow-up visits as told by your health care provider. This is  important. These may include visits for further testing if palpitations do not go away or get worse.      Contact a health care provider if you:  Continue to have a fast or irregular heartbeat after 24 hours.  Notice that your palpitations occur more often. Get help right away if you:  Have chest pain or shortness of breath.  Have a severe headache.  Feel dizzy or you faint. Summary  Palpitations are feelings that your heartbeat is irregular or is faster than normal. It may feel like your heart is fluttering or skipping a beat.  Palpitations may be caused by many things, including smoking, caffeine, alcohol, stress, certain medicines, and drugs.  Although most causes of palpitations are not serious, some causes can be a sign of a serious medical problem.  Get help right away if you faint or have chest pain, shortness of breath, a severe headache, or dizziness. This information is not intended to replace advice given to you by your health care provider. Make sure you discuss any questions you have with your health care provider. Document Revised: 01/08/2018 Document Reviewed: 01/08/2018 Elsevier Patient Education  2021 [employer]  

## 2021-02-09 NOTE — Progress Notes (Signed)
Subjective:    Patient ID: Ryan Glass, male    DOB: 08/09/1956, 65 y.o.   MRN: [id number]  65y/o married established male here to follow up palpitations.  Seen in his workcenter today.  Weight home scale this morning.  Stated he is to wear his zio patch later today.  Has noticed more palpitations at work this morning and since his lasix dose was decreased to 20mg  by cardiology daily.  Patient concerned he may need another ablation.  Patient glad his stress test turned out okay along with echocardiogram.  Knees have been hurting more.  Notices day of enbrel injection less pain but then next day worse pain.  Noticed some weight gain since lasix decreased also. Has decreased his diclofenac to once a day after told at cardiology kidney function slightly worse.  Previously taking BID on bad knee pain days and usually at least daily. Denied dizziness, vision changes, chest pain.  Some dyspnea with exertion today.  Labs due next week.     Review of Systems  Constitutional: Negative for activity change, appetite change, chills, diaphoresis, fatigue and fever.  HENT: Negative for trouble swallowing and voice change.   Eyes: Negative for photophobia and visual disturbance.  Respiratory: Positive for shortness of breath. Negative for cough, choking, chest tightness, wheezing and stridor.        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  Endocrine: Negative for cold intolerance and heat intolerance.  Genitourinary: Negative for difficulty urinating.  Musculoskeletal: Positive for arthralgias, joint swelling and myalgias. Negative for gait problem, neck pain and neck stiffness.  Skin: Negative for rash.  Allergic/Immunologic: Positive for environmental allergies. Negative for food allergies.  Neurological: Negative for dizziness, tremors, seizures, syncope, facial asymmetry, speech difficulty,  weakness, light-headedness, numbness and headaches.  Hematological: Negative for adenopathy. Does not bruise/bleed easily.  Psychiatric/Behavioral: Negative for agitation, confusion and sleep disturbance.       Objective:   Physical Exam Vitals and nursing note reviewed.  Constitutional:      General: He is awake. He is not in acute distress.    Appearance: Normal appearance. He is well-developed and well-groomed. He is obese. He is not ill-appearing, toxic-appearing or diaphoretic.  HENT:     Head: Normocephalic and atraumatic.     Jaw: There is normal jaw occlusion.     Right Ear: Hearing and external ear normal.     Left Ear: Hearing and external ear normal.     Nose: Nose normal. No congestion or rhinorrhea.     Mouth/Throat:     Pharynx: Oropharynx is clear.  Eyes:     General: Lids are normal. Vision grossly intact. Gaze aligned appropriately. Allergic shiner present. No visual field deficit or scleral icterus.       Right eye: No discharge.        Left eye: No discharge.     Extraocular Movements: Extraocular movements intact.     Conjunctiva/sclera: Conjunctivae normal.     Pupils: Pupils are equal, round, and reactive to light.  Neck:     Trachea: Trachea and phonation normal. No tracheal deviation.  Cardiovascular:     Rate and Rhythm: Normal rate. Rhythm irregularly irregular.     Pulses: Normal pulses.          Radial pulses are 2+ on the right side and 2+ on the left side.     Comments: 1 skipped  beat in 30 seconds Pulmonary:     Effort: Pulmonary effort is normal. No respiratory distress.     Breath sounds: Normal breath sounds and air entry. No stridor or transmitted upper airway sounds. No wheezing or rhonchi.     Comments: No cough noted; spoke full sentences without difficulty; wearing mask due to covid 19 pandemic Abdominal:     Palpations: Abdomen is soft.  Musculoskeletal:        General: Deformity present. No signs of injury.     Cervical back: Normal  range of motion and neck supple. No swelling, edema, erythema, signs of trauma, lacerations, rigidity or crepitus.     Right hip: No lacerations or crepitus. Normal range of motion.     Left hip: No lacerations or crepitus. Normal range of motion.     Right knee: No lacerations. Decreased range of motion.     Left knee: No lacerations. Decreased range of motion.     Right lower leg: No tenderness or bony tenderness. 1+ Edema present.     Left lower leg: No tenderness or bony tenderness. 1+ Pitting Edema present.     Right ankle: Swelling present. No ecchymosis or lacerations. No tenderness. Normal range of motion.     Left ankle: Swelling present. No ecchymosis or lacerations. No tenderness. Normal range of motion.  Lymphadenopathy:     Head:     Right side of head: No preauricular adenopathy.     Left side of head: No preauricular adenopathy.     Cervical: No cervical adenopathy.     Right cervical: No superficial cervical adenopathy.    Left cervical: No superficial cervical adenopathy.  Skin:    General: Skin is warm and dry.     Capillary Refill: Capillary refill takes less than 2 seconds.     Coloration: Skin is not ashen, cyanotic, jaundiced, mottled, pale or sallow.     Findings: No abrasion, abscess, acne, bruising, burn, ecchymosis, erythema, signs of injury, laceration, lesion, petechiae, rash or wound.     Nails: There is no clubbing.  Neurological:     General: No focal deficit present.     Mental Status: He is alert and oriented to person, place, and time. Mental status is at baseline.     GCS: GCS eye subscore is 4. GCS verbal subscore is 5. GCS motor subscore is 6.     Cranial Nerves: Cranial nerves are intact. No cranial nerve deficit, dysarthria or facial asymmetry.     Sensory: Sensation is intact. No sensory deficit.     Motor: Motor function is intact. No weakness, tremor, atrophy, abnormal muscle tone or seizure activity.     Coordination: Coordination is intact.  Coordination normal.     Gait: Gait is intact. Gait normal.     Comments: In/out of chair without difficulty; gait sure and steady; bilateral hand grasp equal 5/5  Psychiatric:        Attention and Perception: Attention and perception normal.        Mood and Affect: Mood and affect normal.        Speech: Speech normal.        Behavior: Behavior normal. Behavior is cooperative.        Thought Content: Thought content normal.        Cognition and Memory: Cognition and memory normal.        Judgment: Judgment normal.           Assessment & Plan:  A-palpitations, bilateral  knee pain   P-Patient noted more palpitations at work today wanted check with clinic staff.  Pending zio patch wear from cardiology came in the mail yesterday and he is supposed to apply tonight.  Had stress test and echocardiogram and labs and meds were adjusted in the past week.  BP back to his normal range and pulse has been running in the 60s now since lasix started.  Patient reported was feeling better with lasix BID dosing and shortness of breath resolved.  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  Taking his potassium as prescribed and all of his other medications.  Enbrel weekly injection was yesterday.  Patient going to call rheumatology to follow up/discuss pain level variations after enbrel injection.  He had decreased diclofenac use to daily since kidney function GFR was decreased Cr elevated at cardiology and that could be why knees hurting a little more also.  Weather temperatures have been swinging hot/cold also and discussed with patient cold weather can worsen arthritis symptoms also.  Patient planning to have BMET drawn with RN Rolly Salter next week ordered by cardiology.  Patient to follow up with cardiology/rheumatology as scheduled and notify his providers if new or worsening symptoms.  Patient verbalized understanding information/instructions,  agreed with plan of care and had no further questions at this time.

## 2021-02-10 ENCOUNTER — Other Ambulatory Visit: Payer: Self-pay

## 2021-02-10 ENCOUNTER — Ambulatory Visit: Payer: Self-pay | Admitting: *Deleted

## 2021-02-10 DIAGNOSIS — H04123 Dry eye syndrome of bilateral lacrimal glands: Secondary | ICD-10-CM

## 2021-02-10 NOTE — Progress Notes (Signed)
EE requests moisturizing eye gtts for allergy symptoms. Given 2 vials and took to employee work station. Follow-up as needed. EE verbalizes understanding.

## 2021-02-13 DIAGNOSIS — R072 Precordial pain: Secondary | ICD-10-CM

## 2021-02-13 DIAGNOSIS — I493 Ventricular premature depolarization: Secondary | ICD-10-CM | POA: Diagnosis not present

## 2021-02-13 DIAGNOSIS — I10 Essential (primary) hypertension: Secondary | ICD-10-CM | POA: Diagnosis not present

## 2021-02-13 DIAGNOSIS — R0602 Shortness of breath: Secondary | ICD-10-CM

## 2021-02-13 DIAGNOSIS — E7849 Other hyperlipidemia: Secondary | ICD-10-CM | POA: Diagnosis not present

## 2021-02-13 DIAGNOSIS — I712 Thoracic aortic aneurysm, without rupture: Secondary | ICD-10-CM

## 2021-02-13 NOTE — Telephone Encounter (Signed)
Orders for repeat BMP this week after seeing bump in creatinine and then lowering Lasix dose to 20mg  noted from cardiology. Reached out to pt, no answer. LVM. Plan to schedule for draw tomorrow 02/14/21.

## 2021-02-14 ENCOUNTER — Telehealth: Payer: Self-pay | Admitting: *Deleted

## 2021-02-14 ENCOUNTER — Other Ambulatory Visit: Payer: Self-pay

## 2021-02-14 ENCOUNTER — Encounter: Payer: Self-pay | Admitting: Registered Nurse

## 2021-02-14 ENCOUNTER — Ambulatory Visit: Payer: Self-pay | Admitting: Registered Nurse

## 2021-02-14 VITALS — BP 146/78 | HR 57 | Temp 98.9°F | Wt 252.0 lb

## 2021-02-14 DIAGNOSIS — R002 Palpitations: Secondary | ICD-10-CM

## 2021-02-14 NOTE — Telephone Encounter (Signed)
Patient had BMET drawn today and BP/weight check see office visit.

## 2021-02-14 NOTE — Telephone Encounter (Signed)
Error. Changed to OV.

## 2021-02-14 NOTE — Telephone Encounter (Signed)
Patient had bmet drawn today and sent to [facility]

## 2021-02-14 NOTE — Progress Notes (Signed)
Subjective:    Patient ID: Ryan Glass, male    DOB: [birth year], 65 y.o.   MRN: [id number]  64y/o Hispanic married established male pt presenting for f/u of palpitations. BMP due today per Cardiology one week after lowering Lasix dose as Creatinine had increased on higher dose. Pt with increased palpitations. Ziopatch was applied yesterday 3/7, 3 day wear. EP appt 03/15/21. Pulse today after walking to clinic, more regular, 80's. After resting in chair, pulse slowed to 40's-mid 50's with more frequent PVCs. Weight up 3# from 02/09/21 OV. Patient reported legs have been more swollen earlier in the day since lasix dose decreased and he has noticed more frequent palpitations and need to urinate more often.  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  Denied chest pain, dizziness, headache, syncope, vision changes.     Review of Systems  Constitutional: Positive for fatigue. Negative for chills, diaphoresis and fever.  HENT: Negative for trouble swallowing and voice change.   Eyes: Negative for photophobia and visual disturbance.  Respiratory: Positive for shortness of breath. Negative for cough, choking, chest tightness, wheezing and stridor.   Cardiovascular: Positive for palpitations and leg swelling. Negative for chest pain.  Gastrointestinal: Negative for diarrhea, nausea and vomiting.  Endocrine: Positive for polyuria.  Genitourinary: Positive for frequency. Negative for difficulty urinating.  Musculoskeletal: Positive for arthralgias and joint swelling. Negative for gait problem, neck pain and neck stiffness.  Skin: Negative for rash.  Allergic/Immunologic: Positive for environmental allergies. Negative for food allergies.  Neurological: Negative for tremors, syncope and weakness.  Hematological: Negative for adenopathy. Does not bruise/bleed easily.  Psychiatric/Behavioral: Negative for agitation, confusion and sleep disturbance.       Objective:    Physical Exam Vitals and nursing note reviewed.  Constitutional:      General: He is awake. He is not in acute distress.    Appearance: Normal appearance. He is well-developed and well-groomed. He is obese. He is not ill-appearing, toxic-appearing or diaphoretic.  HENT:     Head: Normocephalic and atraumatic.     Jaw: There is normal jaw occlusion.     Right Ear: Hearing and external ear normal.     Left Ear: Hearing and external ear normal.     Nose: Nose normal. No congestion or rhinorrhea.     Mouth/Throat:     Pharynx: Oropharynx is clear.  Eyes:     General: Lids are normal. Vision grossly intact. Gaze aligned appropriately. Allergic shiner present. No visual field deficit or scleral icterus.       Right eye: No discharge.        Left eye: No discharge.     Extraocular Movements: Extraocular movements intact.     Conjunctiva/sclera: Conjunctivae normal.     Pupils: Pupils are equal, round, and reactive to light.  Neck:     Trachea: Trachea and phonation normal. No tracheal deviation.  Cardiovascular:     Rate and Rhythm: Rhythm irregular.     Pulses: Normal pulses.          Radial pulses are 2+ on the right side and 2+ on the left side.     Comments: Irregular irregular rhythm; fewer pauses when initially sat down as rate decreased to 40s palpitations became more frequent (irregular rhythm) Pulmonary:     Effort: Pulmonary effort is normal. No respiratory distress.     Breath sounds: Normal breath sounds and air entry. No stridor. No wheezing or rhonchi.  Comments: No cough in clinic; spoke full sentences without difficulty even after immediate arrival walking across warehouse; wearing mask due to covid 19 pandemic Abdominal:     Palpations: Abdomen is soft.  Musculoskeletal:        General: Swelling present. No tenderness, deformity or signs of injury. Normal range of motion.     Right elbow: No swelling, effusion or lacerations. Normal range of motion.     Left elbow:  No swelling, effusion or lacerations. Normal range of motion.     Right hand: No swelling, lacerations or tenderness. Normal strength.     Left hand: No swelling, lacerations or tenderness. Normal strength.     Cervical back: Neck supple. No swelling, edema, deformity, erythema, signs of trauma, lacerations, rigidity, torticollis or crepitus. No pain with movement. Normal range of motion.     Thoracic back: No swelling, edema, signs of trauma or lacerations.     Lumbar back: No swelling, edema, signs of trauma or lacerations.     Right lower leg: 1+ Pitting Edema present.     Left lower leg: 1+ Pitting Edema present.  Lymphadenopathy:     Head:     Right side of head: No submental or preauricular adenopathy.     Left side of head: No submental or preauricular adenopathy.     Cervical: No cervical adenopathy.     Right cervical: No superficial cervical adenopathy.    Left cervical: No superficial cervical adenopathy.  Skin:    General: Skin is warm and dry.     Capillary Refill: Capillary refill takes less than 2 seconds.     Coloration: Skin is not ashen, cyanotic, jaundiced, mottled, pale or sallow.     Findings: No abrasion, abscess, acne, bruising, burn, ecchymosis, erythema, signs of injury, laceration, lesion, petechiae, rash or wound.     Nails: There is no clubbing.  Neurological:     General: No focal deficit present.     Mental Status: He is alert and oriented to person, place, and time. Mental status is at baseline.     GCS: GCS eye subscore is 4. GCS verbal subscore is 5. GCS motor subscore is 6.     Cranial Nerves: Cranial nerves are intact. No cranial nerve deficit, dysarthria or facial asymmetry.     Sensory: Sensation is intact. No sensory deficit.     Motor: Motor function is intact. No weakness, tremor, atrophy, abnormal muscle tone or seizure activity.     Coordination: Coordination is intact. Coordination normal.     Gait: Gait is intact. Gait normal.     Comments:  Gait sure and steady; in/out of chair without difficulty; bilateral hand grasp equal 5/5  Psychiatric:        Attention and Perception: Attention and perception normal.        Mood and Affect: Mood and affect normal.        Speech: Speech normal.        Behavior: Behavior normal. Behavior is cooperative.        Thought Content: Thought content normal.        Cognition and Memory: Cognition and memory normal.        Judgment: Judgment normal.           Assessment & Plan:  A-palpitations  P-patient here for BMET for cardiology and follow up BP/weight/pulse check.  Weight gain since 3/3 and patient has noticed more am edema bilaterally at sock line.  Wearing zio patch and has  follow up scheduled with physiologist.  Taking his medications as prescribed.  Working without difficulty but concerned about palpitations and shortness of breath with exertion not improving long term with medication change directed by cardiology.  Patient to follow up via telephone with cardiology tomorrow as [facility] normal closed day tomorrow/RN NP not onsite Wed/Sat/Sun.  Finish wear of zio patch as instructed by cardiology.  Discussed with patient cardiology may be adjusting his medications again will need to see how kidneys responding to lasix along with electrolytes.  Avoid high caffeine intake, high salt intake, elevate legs when sitting consider compression stockings to knees as wearing ankle socks most days. [facility]care handouts leg swelling, palpitations.  Have re-evaluation same day if syncope, chest pain with a provider. Patient verbalized understanding information/instructions, agreed with plan of care and had no further questions at this time.

## 2021-02-14 NOTE — Patient Instructions (Signed)
Edema  Edema is an abnormal buildup of fluids in the body tissues and under the skin. Swelling of the legs, feet, and ankles is a common symptom that becomes more likely as you get older. Swelling is also common in looser tissues, like around the eyes. When the affected area is squeezed, the fluid may move out of that spot and leave a dent for a few moments. This dent is called pitting edema. There are many possible causes of edema. Eating too much salt (sodium) and being on your feet or sitting for a long time can cause edema in your legs, feet, and ankles. Hot weather may make edema worse. Common causes of edema include:  Heart failure.  Liver or kidney disease.  Weak leg blood vessels.  Cancer.  An injury.  Pregnancy.  Medicines.  Being obese.  Low protein levels in the blood. Edema is usually painless. Your skin may look swollen or shiny. Follow these instructions at home:  Keep the affected body part raised (elevated) above the level of your heart when you are sitting or lying down.  Do not sit still or stand for long periods of time.  Do not wear tight clothing. Do not wear garters on your upper legs.  Exercise your legs to get your circulation going. This helps to move the fluid back into your blood vessels, and it may help the swelling go down.  Wear elastic bandages or support stockings to reduce swelling as told by your health care provider.  Eat a low-salt (low-sodium) diet to reduce fluid as told by your health care provider.  Depending on the cause of your swelling, you may need to limit how much fluid you drink (fluid restriction).  Take over-the-counter and prescription medicines only as told by your health care provider. Contact a health care provider if:  Your edema does not get better with treatment.  You have heart, liver, or kidney disease and have symptoms of edema.  You have sudden and unexplained weight gain. Get help right away if:  You develop  shortness of breath or chest pain.  You cannot breathe when you lie down.  You develop pain, redness, or warmth in the swollen areas.  You have heart, liver, or kidney disease and suddenly get edema.  You have a fever and your symptoms suddenly get worse. Summary  Edema is an abnormal buildup of fluids in the body tissues and under the skin.  Eating too much salt (sodium) and being on your feet or sitting for a long time can cause edema in your legs, feet, and ankles.  Keep the affected body part raised (elevated) above the level of your heart when you are sitting or lying down. This information is not intended to replace advice given to you by your health care provider. Make sure you discuss any questions you have with your health care provider. Document Revised: 04/15/2019 Document Reviewed: 12/29/2016 Elsevier Patient Education  2021 [employer] Inc[employer] Palpitations Palpitations are feelings that your heartbeat is irregular or is faster than normal. It may feel like your heart is fluttering or skipping a beat. Palpitations are usually not a serious problem. They may be caused by many things, including smoking, caffeine, alcohol, stress, and certain medicines or drugs. Most causes of palpitations are not serious. However, some palpitations can be a sign of a serious problem. You may need further tests to rule out serious medical problems. Follow these instructions at home: Pay attention to any changes in your condition. Take  these actions to help manage your symptoms: Eating and drinking  Avoid foods and drinks that may cause palpitations. These may include: ? Caffeinated coffee, tea, soft drinks, diet pills, and energy drinks. ? Chocolate. ? Alcohol. Lifestyle  Take steps to reduce your stress and anxiety. Things that can help you relax include: ? Yoga. ? Mind-body activities, such as deep breathing, meditation, or using words and images to create positive thoughts (guided  imagery). ? Physical activity, such as swimming, jogging, or walking. Tell your health care provider if your palpitations increase with activity. If you have chest pain or shortness of breath with activity, do not continue the activity until you are seen by your health care provider. ? Biofeedback. This is a method that helps you learn to use your mind to control things in your body, such as your heartbeat.  Do not use drugs, including cocaine or ecstasy. Do not use marijuana.  Get plenty of rest and sleep. Keep a regular bed time. General instructions  Take over-the-counter and prescription medicines only as told by your health care provider.  Do not use any products that contain nicotine or tobacco, such as cigarettes and e-cigarettes. If you need help quitting, ask your health care provider.  Keep all follow-up visits as told by your health care provider. This is important. These may include visits for further testing if palpitations do not go away or get worse.      Contact a health care provider if you:  Continue to have a fast or irregular heartbeat after 24 hours.  Notice that your palpitations occur more often. Get help right away if you:  Have chest pain or shortness of breath.  Have a severe headache.  Feel dizzy or you faint. Summary  Palpitations are feelings that your heartbeat is irregular or is faster than normal. It may feel like your heart is fluttering or skipping a beat.  Palpitations may be caused by many things, including smoking, caffeine, alcohol, stress, certain medicines, and drugs.  Although most causes of palpitations are not serious, some causes can be a sign of a serious medical problem.  Get help right away if you faint or have chest pain, shortness of breath, a severe headache, or dizziness. This information is not intended to replace advice given to you by your health care provider. Make sure you discuss any questions you have with your health care  provider. Document Revised: 01/08/2018 Document Reviewed: 01/08/2018 Elsevier Patient Education  2021 [employer].

## 2021-02-15 ENCOUNTER — Other Ambulatory Visit: Payer: Self-pay

## 2021-02-15 ENCOUNTER — Telehealth: Payer: Self-pay

## 2021-02-15 DIAGNOSIS — I10 Essential (primary) hypertension: Secondary | ICD-10-CM

## 2021-02-15 DIAGNOSIS — Z79899 Other long term (current) drug therapy: Secondary | ICD-10-CM

## 2021-02-15 LAB — BASIC METABOLIC PANEL
BUN/Creatinine Ratio: 15 (ref 10–24)
BUN: 18 mg/dL (ref 8–27)
CO2: 18 mmol/L — ABNORMAL LOW (ref 20–29)
Calcium: 9.1 mg/dL (ref 8.6–10.2)
Chloride: 105 mmol/L (ref 96–106)
Creatinine, Ser: 1.21 mg/dL (ref 0.76–1.27)
Glucose: 133 mg/dL — ABNORMAL HIGH (ref 65–99)
Potassium: 3.6 mmol/L (ref 3.5–5.2)
Sodium: 142 mmol/L (ref 134–144)
eGFR: 67 mL/min/{1.73_m2} (ref >59)

## 2021-02-15 MED ORDER — FUROSEMIDE 40 MG PO TABS: 40.0000 mg | ORAL_TABLET | Freq: Every day | ORAL | 3 refills | Status: DC

## 2021-02-15 NOTE — Telephone Encounter (Signed)
The patient has been notified of the result and verbalized understanding.  All questions (if any) were answered. Theresia Majors, RN 02/15/2021 11:09 AM  Patient will increase lasix to 40 mg daily and repeat lab work on Friday.

## 2021-02-15 NOTE — Telephone Encounter (Signed)
-----   Message from Quintella Reichert, MD sent at 02/15/2021  9:56 AM EST ----- Kinnie Feil please have patient increase Lasix back to 40mg  daily and check BMET on Friday.  Please move up appt with Dr Sunday for PVCs and increased palpitations

## 2021-02-16 NOTE — Progress Notes (Signed)
Noted labs rescheduled to Monday 02/20/21 fasting with other ordered labs

## 2021-02-16 NOTE — Progress Notes (Signed)
Pt scheduled for repeat BMP 3/11 at 11:30.

## 2021-02-16 NOTE — Progress Notes (Signed)
Noted follow up BMET scheduled.

## 2021-02-16 NOTE — Progress Notes (Signed)
Pt unavailable for fasting labs tomorrow, as due for repeat FLP and ALT. Rescheduled to Monday 3/14.

## 2021-02-20 ENCOUNTER — Ambulatory Visit: Payer: Self-pay | Admitting: *Deleted

## 2021-02-20 ENCOUNTER — Other Ambulatory Visit: Payer: Self-pay

## 2021-02-20 VITALS — BP 155/93 | HR 71 | Ht 67.5 in | Wt 248.0 lb

## 2021-02-20 DIAGNOSIS — E876 Hypokalemia: Secondary | ICD-10-CM

## 2021-02-20 DIAGNOSIS — E7849 Other hyperlipidemia: Secondary | ICD-10-CM

## 2021-02-20 DIAGNOSIS — I10 Essential (primary) hypertension: Secondary | ICD-10-CM

## 2021-02-20 DIAGNOSIS — Z0000 Encounter for general adult medical examination without abnormal findings: Secondary | ICD-10-CM

## 2021-02-20 NOTE — Telephone Encounter (Signed)
Cardiology ordered increased lasix dose to 40mg  po daily and repeat BMET in one week drawn today.  Creatinine/GFR improved on lasix 20mg  po daily.

## 2021-02-20 NOTE — Telephone Encounter (Signed)
[facility] labs drawn today along with BMET ordered by cardiology after lasix increased to 40mg  again.

## 2021-02-20 NOTE — Progress Notes (Signed)
[employer] insurance premium discount evaluation: Labs Drawn. Replacements ROI form signed. Tobacco Free Attestation form signed.  Forms placed in paper chart.   Routing to pcp, cardiology, and rheumatology.

## 2021-02-21 ENCOUNTER — Ambulatory Visit: Payer: Self-pay | Admitting: *Deleted

## 2021-02-21 VITALS — BP 140/89 | HR 75

## 2021-02-21 DIAGNOSIS — I10 Essential (primary) hypertension: Secondary | ICD-10-CM

## 2021-02-21 LAB — CMP12+LP+TP+TSH+6AC+PSA+CBC…
ALT: 37 IU/L (ref 0–44)
AST: 31 IU/L (ref 0–40)
Albumin/Globulin Ratio: 2.3 — ABNORMAL HIGH (ref 1.2–2.2)
Albumin: 4.8 g/dL (ref 3.8–4.8)
Alkaline Phosphatase: 76 IU/L (ref 44–121)
BUN/Creatinine Ratio: 17 (ref 10–24)
BUN: 21 mg/dL (ref 8–27)
Basophils Absolute: 0 10*3/uL (ref 0.0–0.2)
Basos: 0 %
Bilirubin Total: 0.7 mg/dL (ref 0.0–1.2)
Calcium: 9.7 mg/dL (ref 8.6–10.2)
Chloride: 106 mmol/L (ref 96–106)
Chol/HDL Ratio: 2.1 ratio (ref 0.0–5.0)
Cholesterol, Total: 136 mg/dL (ref 100–199)
Creatinine, Ser: 1.24 mg/dL (ref 0.76–1.27)
EOS (ABSOLUTE): 0 10*3/uL (ref 0.0–0.4)
Eos: 0 %
Estimated CHD Risk: <0.5 times avg. (ref 0.0–1.0)
Free Thyroxine Index: 1.5 (ref 1.2–4.9)
GGT: 27 IU/L (ref 0–65)
Globulin, Total: 2.1 g/dL (ref 1.5–4.5)
Glucose: 108 mg/dL — ABNORMAL HIGH (ref 65–99)
HDL: 64 mg/dL (ref >39)
Hematocrit: 42.7 % (ref 37.5–51.0)
Hemoglobin: 14.4 g/dL (ref 13.0–17.7)
Immature Grans (Abs): 0 10*3/uL (ref 0.0–0.1)
Immature Granulocytes: 0 %
Iron: 80 ug/dL (ref 38–169)
LDH: 272 IU/L — ABNORMAL HIGH (ref 121–224)
LDL Chol Calc (NIH): 60 mg/dL (ref 0–99)
Lymphocytes Absolute: 0.8 10*3/uL (ref 0.7–3.1)
Lymphs: 11 %
MCH: 33.1 pg — ABNORMAL HIGH (ref 26.6–33.0)
MCHC: 33.7 g/dL (ref 31.5–35.7)
MCV: 98 fL — ABNORMAL HIGH (ref 79–97)
Monocytes Absolute: 0.3 10*3/uL (ref 0.1–0.9)
Monocytes: 4 %
Neutrophils Absolute: 5.6 10*3/uL (ref 1.4–7.0)
Neutrophils: 85 %
Phosphorus: 3.4 mg/dL (ref 2.8–4.1)
Platelets: 186 10*3/uL (ref 150–450)
Potassium: 3.7 mmol/L (ref 3.5–5.2)
Prostate Specific Ag, Serum: 1.2 ng/mL (ref 0.0–4.0)
RBC: 4.35 x10E6/uL (ref 4.14–5.80)
RDW: 13.3 % (ref 11.6–15.4)
Sodium: 142 mmol/L (ref 134–144)
T3 Uptake Ratio: 26 % (ref 24–39)
T4, Total: 5.7 ug/dL (ref 4.5–12.0)
TSH: 0.816 u[IU]/mL (ref 0.450–4.500)
Total Protein: 6.9 g/dL (ref 6.0–8.5)
Triglycerides: 55 mg/dL (ref 0–149)
Uric Acid: 8.6 mg/dL — ABNORMAL HIGH (ref 3.8–8.4)
VLDL Cholesterol Cal: 12 mg/dL (ref 5–40)
WBC: 6.7 10*3/uL (ref 3.4–10.8)
eGFR: 65 mL/min/{1.73_m2} (ref >59)

## 2021-02-21 LAB — HGB A1C W/O EAG: Hgb A1c MFr Bld: 5.3 % (ref 4.8–5.6)

## 2021-02-21 NOTE — Progress Notes (Signed)
Yesterday's lab results reviewed. Hard copy of results provided as well as low purine and prediabetes handouts.

## 2021-02-21 NOTE — Patient Instructions (Signed)
Diabetes Care, 44(Suppl 1), D40-C14. https://doi.org/https://doi.org/10.2337/dc21-S003">  Prediabetes Prediabetes is when your blood sugar (blood glucose) level is higher than normal but not high enough for you to be diagnosed with type 2 diabetes. Having prediabetes puts you at risk for developing type 2 diabetes (type 2 diabetes mellitus). With certain lifestyle changes, you may be able to prevent or delay the onset of type 2 diabetes. This is important because type 2 diabetes can lead to serious complications, such as:  Heart disease.  Stroke.  Blindness.  Kidney disease.  Depression.  Poor circulation in the feet and legs. In severe cases, this could lead to surgical removal of a leg (amputation). What are the causes? The exact cause of prediabetes is not known. It may result from insulin resistance. Insulin resistance develops when cells in the body do not respond properly to insulin that the body makes. This can cause excess glucose to build up in the blood. High blood glucose (hyperglycemia) can develop. What increases the risk? The following factors may make you more likely to develop this condition:  You have a family member with type 2 diabetes.  You are older than 45 years.  You had a temporary form of diabetes during a pregnancy (gestational diabetes).  You had polycystic ovary syndrome (PCOS).  You are overweight or obese.  You are inactive (sedentary).  You have a history of heart disease, including problems with cholesterol levels, high levels of blood fats, or high blood pressure. What are the signs or symptoms? You may have no symptoms. If you do have symptoms, they may include:  Increased hunger.  Increased thirst.  Increased urination.  Vision changes, such as blurry vision.  Tiredness (fatigue). How is this diagnosed? This condition can be diagnosed with blood tests. Your blood glucose may be checked with one or more of the following tests:  A  fasting blood glucose (FBG) test. You will not be allowed to eat (you will fast) for at least 8 hours before a blood sample is taken.  An A1C blood test (hemoglobin A1C). This test provides information about blood glucose levels over the previous 2?3 months.  An oral glucose tolerance test (OGTT). This test measures your blood glucose at two points in time: ? After fasting. This is your baseline level. ? Two hours after you drink a beverage that contains glucose. You may be diagnosed with prediabetes if:  Your FBG is 100?125 mg/dL (5.6-6.9 mmol/L).  Your A1C level is 5.7?6.4% (39-46 mmol/mol).  Your OGTT result is 140?199 mg/dL (7.8-11 mmol/L). These blood tests may be repeated to confirm your diagnosis.   How is this treated? Treatment may include dietary and lifestyle changes to help lower your blood glucose and prevent type 2 diabetes from developing. In some cases, medicine may be prescribed to help lower the risk of type 2 diabetes. Follow these instructions at home: Nutrition  Follow a healthy meal plan. This includes eating lean proteins, whole grains, legumes, fresh fruits and vegetables, low-fat dairy products, and healthy fats.  Follow instructions from your health care provider about eating or drinking restrictions.  Meet with a dietitian to create a healthy eating plan that is right for you.   Lifestyle  Do moderate-intensity exercise for at least 30 minutes a day on 5 or more days each week, or as told by your health care provider. A mix of activities may be best, such as: ? Brisk walking, swimming, biking, and weight lifting.  Lose weight as told by your health  care provider. Losing 5-7% of your body weight can reverse insulin resistance.  Do not drink alcohol if: ? Your health care provider tells you not to drink. ? You are pregnant, may be pregnant, or are planning to become pregnant.  If you drink alcohol: ? Limit how much you use to:  0-1 drink a day for  women.  0-2 drinks a day for men. ? Be aware of how much alcohol is in your drink. In the [place] one drink equals one 12 oz bottle of beer (355 mL), one 5 oz glass of wine (148 mL), or one 1 oz glass of hard liquor (44 mL). General instructions  Take over-the-counter and prescription medicines only as told by your health care provider. You may be prescribed medicines that help lower the risk of type 2 diabetes.  Do not use any products that contain nicotine or tobacco, such as cigarettes, e-cigarettes, and chewing tobacco. If you need help quitting, ask your health care provider.  Keep all follow-up visits. This is important. Where to find more information  American Diabetes Association: www.diabetes.org  Academy of Nutrition and Dietetics: www.eatright.org  American Heart Association: www.heart.org Contact a health care provider if:  You have any of these symptoms: ? Increased hunger. ? Increased urination. ? Increased thirst. ? Fatigue. ? Vision changes, such as blurry vision. Get help right away if you:  Have shortness of breath.  Feel confused.  Vomit or feel like you may vomit. Summary  Prediabetes is when your blood sugar (blood glucose)level is higher than normal but not high enough for you to be diagnosed with type 2 diabetes.  Having prediabetes puts you at risk for developing type 2 diabetes (type 2 diabetes mellitus).  Make lifestyle changes such as eating a healthy diet and exercising regularly to help prevent diabetes. Lose weight as told by your health care provider. This information is not intended to replace advice given to you by your health care provider. Make sure you discuss any questions you have with your health care provider. Document Revised: 02/25/2020 Document Reviewed: 02/25/2020 Elsevier Patient Education  2021 Elsevier Inc.  Low-Purine Eating Plan A low-purine eating plan involves making food choices to limit your intake of purine. Purine is  a kind of uric acid. Too much uric acid in your blood can cause certain conditions, such as gout and kidney stones. Eating a low-purine diet can help control these conditions. What are tips for following this plan? Reading food labels  Avoid foods with saturated or Trans fat.  Check the ingredient list of grains-based foods, such as bread and cereal, to make sure that they contain whole grains.  Check the ingredient list of sauces or soups to make sure they do not contain meat or fish.  When choosing soft drinks, check the ingredient list to make sure they do not contain high-fructose corn syrup. Shopping  Buy plenty of fresh fruits and vegetables.  Avoid buying canned or fresh fish.  Buy dairy products labeled as low-fat or nonfat.  Avoid buying premade or processed foods. These foods are often high in fat, salt (sodium), and added sugar.   Cooking  Use olive oil instead of butter when cooking. Oils like olive oil, canola oil, and sunflower oil contain healthy fats. Meal planning  Learn which foods do or do not affect you. If you find out that a food tends to cause your gout symptoms to flare up, avoid eating that food. You can enjoy foods that do not cause  problems. If you have any questions about a food item, talk with your dietitian or health care provider.  Limit foods high in fat, especially saturated fat. Fat makes it harder for your body to get rid of uric acid.  Choose foods that are lower in fat and are lean sources of protein. General guidelines  Limit alcohol intake to no more than 1 drink a day for nonpregnant women and 2 drinks a day for men. One drink equals 12 oz of beer, 5 oz of wine, or 1 oz of hard liquor. Alcohol can affect the way your body gets rid of uric acid.  Drink plenty of water to keep your urine clear or pale yellow. Fluids can help remove uric acid from your body.  If directed by your health care provider, take a vitamin C supplement.  Work with  your health care provider and dietitian to develop a plan to achieve or maintain a healthy weight. Losing weight can help reduce uric acid in your blood. What foods are recommended? The items listed may not be a complete list. Talk with your dietitian about what dietary choices are best for you. Foods low in purines Foods low in purines do not need to be limited. These include:  All fruits.  All low-purine vegetables, pickles, and olives.  Breads, pasta, rice, cornbread, and popcorn. Cake and other baked goods.  All dairy foods.  Eggs, nuts, and nut butters.  Spices and condiments, such as salt, herbs, and vinegar.  Plant oils, butter, and margarine.  Water, sugar-free soft drinks, tea, coffee, and cocoa.  Vegetable-based soups, broths, sauces, and gravies. Foods moderate in purines Foods moderate in purines should be limited to the amounts listed.   cup of asparagus, cauliflower, spinach, mushrooms, or green peas, each day.  2/3 cup uncooked oatmeal, each day.   cup dry wheat bran or wheat germ, each day.  2-3 ounces of meat or poultry, each day.  4-6 ounces of shellfish, such as crab, lobster, oysters, or shrimp, each day.  1 cup cooked beans, peas, or lentils, each day.  Soup, broths, or bouillon made from meat or fish. Limit these foods as much as possible. What foods are not recommended? The items listed may not be a complete list. Talk with your dietitian about what dietary choices are best for you. Limit your intake of foods high in purines, including:  Beer and other alcohol.  Meat-based gravy or sauce.  Canned or fresh fish, such as: ? Anchovies, sardines, herring, and tuna. ? Mussels and scallops. ? Codfish, trout, and haddock.  Tomasa Blase.  Organ meats, such as: ? Liver or kidney. ? Tripe. ? Sweetbreads (thymus gland or pancreas).  Wild Education officer, environmental.  Yeast or yeast extract supplements.  Drinks sweetened with high-fructose corn  syrup. Summary  Eating a low-purine diet can help control conditions caused by too much uric acid in the body, such as gout or kidney stones.  Choose low-purine foods, limit alcohol, and limit foods high in fat.  You will learn over time which foods do or do not affect you. If you find out that a food tends to cause your gout symptoms to flare up, avoid eating that food. This information is not intended to replace advice given to you by your health care provider. Make sure you discuss any questions you have with your health care provider. Document Revised: 03/10/2020 Document Reviewed: 03/10/2020 Elsevier Patient Education  2021 [employer].

## 2021-02-21 NOTE — Telephone Encounter (Signed)
Normal BMET, symptoms improved Creatine still in normal range on lasix 40mg  po daily and patient has had weight loss/decreased swelling and palpitations.  Feeling better but uric acid up low purine diet discussed along with decreasing added sugars in diet.  Patient had family birthday party this weekend and fatty meat and birthday cake probable cause of increase in uric acid level denied gout symptoms at this time.  He also got weight set to start exercising at home.  Discussed with patient start low weight and increased reps due to BP/hypertension and aneurysm history.  Avoid holding breath with lifting and encouraged him to take walks now that outdoor temperatures and weather more pleasant.  Avoid dehydration and drink water versus coffee/caffinated beverages.  Patient verbalized understanding and [facility] handouts printed by RN for patient to discuss with his family members.  Patient will follow up with cardiology.  RN Rolly Salter routed results to Dr Rolly Salter office and patient here today for BP check with RN Mayford Knife.  Patient no dyspnea with exertion/spoke full sentences without difficulty. Patient reported he had read my chart message I sent him earlier today also.  No further questions or concerns from patient at this time.

## 2021-02-21 NOTE — Telephone Encounter (Signed)
Discussed elevated fasting glucose and uric acid with patient today.  Creatinine and GFR stable on lasix 45m po daily.  He will follow up with cardiology and decrease added sugars in diet/avoid dehydration/added sugars in diet and large portions of meat.  Patient given low purine diet/gout and prediabetes handouts from [employer] by [employer] today.  Patient will follow up with cardiology and PCM.  Denied gout symptoms today and started new exercise regimen this weekend.  Has had weight loss/decreased swelling and palpitations with increased lasix dosing from 20 to 493mdaily.  Results for BATIPTON, BALLOWMRN [id number] of 02/21/2021 13:17  Ref. Range 02/06/2021 10:28 02/07/2021 11:05 02/08/2021 08:25 02/14/2021 10:00 02/20/2021 12:15  Sodium Latest Ref Range: 134 - 144 mmol/L 143   142 142  Potassium Latest Ref Range: 3.5 - 5.2 mmol/L 3.6   3.6 3.7  Chloride Latest Ref Range: 96 - 106 mmol/L 102   105 106  CO2 Latest Ref Range: 20 - 29 mmol/L 20   18 (L)   Glucose Latest Ref Range: 65 - 99 mg/dL 92   133 (H) 108 (H)  BUN Latest Ref Range: 8 - 27 mg/dL 27   18 21   Creatinine Latest Ref Range: 0.76 - 1.27 mg/dL 1.37 (H)   1.21 1.24  Calcium Latest Ref Range: 8.6 - 10.2 mg/dL 10.1   9.1 9.7  BUN/Creatinine Ratio Latest Ref Range: 10 - 24  20   15 17   EGFR Latest Ref Range: >59 mL/min/1.73 58 (L)   67 65  Phosphorus Latest Ref Range: 2.8 - 4.1 mg/dL     3.4  Alkaline Phosphatase Latest Ref Range: 44 - 121 IU/L     76  Albumin Latest Ref Range: 3.8 - 4.8 g/dL     4.8  Albumin/Globulin Ratio Latest Ref Range: 1.2 - 2.2      2.3 (H)  Uric Acid Latest Ref Range: 3.8 - 8.4 mg/dL     8.6 (H)

## 2021-02-27 ENCOUNTER — Telehealth: Payer: Self-pay

## 2021-02-27 MED ORDER — DILTIAZEM HCL ER COATED BEADS 180 MG PO CP24: 180.0000 mg | ORAL_CAPSULE | Freq: Every day | ORAL | 3 refills | Status: DC

## 2021-02-27 NOTE — Telephone Encounter (Signed)
-----   Message from Quintella Reichert, MD sent at 02/26/2021  2:41 PM EDT ----- Heart monitor showed increased PVC load of 10% with nonsustained atrial tachycardia as well.  Already on Bystolic 10mg  daily.  Please stop amlodipine and start Cardizem CD 180mg  daily.  FOlowup with PA in 2-3 weeks

## 2021-02-27 NOTE — Telephone Encounter (Signed)
The patient has been notified of the result and verbalized understanding.  All questions (if any) were answered. Theresia Majors, RN 02/27/2021 1:48 PM

## 2021-02-28 ENCOUNTER — Encounter: Payer: Self-pay | Admitting: Registered Nurse

## 2021-02-28 ENCOUNTER — Telehealth: Payer: Self-pay | Admitting: Registered Nurse

## 2021-02-28 NOTE — Telephone Encounter (Addendum)
Patient seen in [facility]center and reported stopped amlodipine today last dose yesterday and was told to start cardiazem by cardiology.  Still having some leg swelling today.  He is wondering when this will go away.  Discussed with patient per epocrates half life amlodipine 30-50 hours so next week we should have a better idea of his baseline ankle swelling off amlodipine and taking cardiazem.  sp02 88-92% RA today BBS CTA spoke full sentences without difficulty left lower extremity pitting edema 1+ and right nonpitting trace.  Skin warm dry and pink.  Feeling well denied palpitations.  Has been taking lasix 40mg  po daily.  Hasn't weighed himself at home this week.  Discussed to take extra prednisone tablet 20mg  total the next 3 days and follow up with me in clinic on 03/02/21 for lung check, weight/BP/leg swelling check.  Discussed I will dispense another prednisone bottle from [facility] to patient on 3/24 also.  Patient verbalized understanding information/instructions, agreed with plan of care and had no further questions at this time.

## 2021-03-02 ENCOUNTER — Ambulatory Visit: Payer: Self-pay | Admitting: *Deleted

## 2021-03-02 ENCOUNTER — Other Ambulatory Visit: Payer: Self-pay

## 2021-03-02 VITALS — BP 139/81 | HR 59 | Wt 249.0 lb

## 2021-03-02 DIAGNOSIS — I10 Essential (primary) hypertension: Secondary | ICD-10-CM

## 2021-03-02 MED ORDER — ALBUTEROL SULFATE HFA 108 (90 BASE) MCG/ACT IN AERS: 2.0000 | INHALATION_SPRAY | RESPIRATORY_TRACT | 2 refills | Status: DC | PRN

## 2021-03-02 NOTE — Telephone Encounter (Signed)
Patient came for follow up BP/weight/sp02 and lung check with RN [name] today.  I met with patient briefly as he was leaving clinic bilateral lower extremities with 0-1+ pitting right and 1-2+ pitting left today.  Weight stable from last clinic visit.  Patient had used albuterol inhaler prior to my assessment BBS CTA and spoke full sentences without difficulty.  Has follow up with cardiology scheduled and picked up formulary [facility] fill prednisone 63m sig take 2 x 3 days #21 RF0 today.  He used his rheumatology pills at home to increase dosing for bronchitis to 270mx 3 days starting 02/28/2021.  Patient reported felt better with increased dosing.  Eyes with grit in am and he has been having more allergic rhinitis/conjunctivitis symptoms with spring bloom.  Taking singulair 1047mo qhs, flonase and nasal saline every day.  Discussed with patient prednisone increases heart rate and BP and do not want to stay on 39m39mnger than needed.  Patient using albuterol inhaler prn wheeze/dyspnea with exertion with good relief this week also  albuterol 90mc87m2 puffs po q4-6h prn wheeze/dyspnea  #1 RF0 sent to his pharmacy of choice electronically today also.  Patient verbalized understanding information/instructions, agreed with plan of care and had no further questions at this time.

## 2021-03-02 NOTE — Progress Notes (Signed)
Pt reports feeling a little more ShOB than usual today.   LLE with pitting 2+ RLE without pitting 2+  O2 sat today 96%  Slight expiratory wheeze R lung. No albuterol use today, only Advair at 0500.   Albuterol inh refill sent to pharmacy of choice. Currently still using BID, AM and QHS.

## 2021-03-04 ENCOUNTER — Other Ambulatory Visit: Payer: Self-pay | Admitting: Physician Assistant

## 2021-03-08 NOTE — Telephone Encounter (Signed)
Attempted to follow up with patient on Tuesday 03/05/21 but he had left work early.  I plan to follow up with him 03/09/21 onsite.

## 2021-03-09 NOTE — Telephone Encounter (Signed)
Patient seen in workcenter.  BBS CTA, sp02 RA 96-97% RA spoke full sentences without difficulty HR 72  Gait sure and steady.  Bilateral ankle/leg edema less than last week 0-1+/4 trace pitting bilaterally.  Patient told me he didn't stop extra prednisone doses and states his arthritis has been feeling better along with his breathing. Patient reported he did enbrel injection yesterday not noticing decreased arthritis symptoms like when he first started medication. But he felt much better with additional 10mg  prednisone per day.  He stated coworkers have noticed he is walking straighter and not with back hunched since starting injectable arthritis medication.  He has appt with rheumatology tomorrow.  Encouraged patient to discuss medications with rheumatologist.   He wants to know if he needs to stop  prednisone 20mg  daily and I emphasized yes as increased steroid doses long term can have negative effects on bones in body, blood pressure, cause weight gain. He is no longer needing for bronchitis.  Patient is supposed to take prednisone 5mg  am/pm for arthritis per rheumatology. Patient stated he has stopped regular sugar sodas, cut sugar in coffee at home in half and has been changing diet to lose weight but no weight lost yet.  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  Patient would like print out of labs to take with him to rheumatology tomorrow.  Discussed RN can print for him.  Patient verbalized understanding information/instructions, agreed with plan of care and had no further questions at this time.

## 2021-03-15 ENCOUNTER — Other Ambulatory Visit: Payer: Self-pay

## 2021-03-15 ENCOUNTER — Ambulatory Visit (INDEPENDENT_AMBULATORY_CARE_PROVIDER_SITE_OTHER): Payer: No Typology Code available for payment source | Admitting: Internal Medicine

## 2021-03-15 ENCOUNTER — Encounter: Payer: Self-pay | Admitting: Internal Medicine

## 2021-03-15 VITALS — BP 134/80 | HR 73 | Ht 70.0 in | Wt 257.0 lb

## 2021-03-15 DIAGNOSIS — Z79899 Other long term (current) drug therapy: Secondary | ICD-10-CM

## 2021-03-15 DIAGNOSIS — I493 Ventricular premature depolarization: Secondary | ICD-10-CM

## 2021-03-15 DIAGNOSIS — R0602 Shortness of breath: Secondary | ICD-10-CM

## 2021-03-15 MED ORDER — FUROSEMIDE 40 MG PO TABS: 40.0000 mg | ORAL_TABLET | Freq: Two times a day (BID) | ORAL | 3 refills | Status: DC

## 2021-03-15 NOTE — Progress Notes (Signed)
HPI Ryan Glass returns today for followup. He is a pleasant 65 yo man with PVC's for which he underwent catheter ablation in 2019. 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 He had 10% PVC's mostly monomorphic on his cardiac monitor. Despite normal LV function he has class 2B dyspnea. He admits to dietary indiscretion with sodium. He denies edema. He has minimal palpitations. He is pending colonoscopy. Allergies  Allergen Reactions  . Amlodipine Besylate Swelling    Leg swelling with 10mg  daily am dosing; decreased with BID 5mg  dosing  . Aspirin Itching     Current Outpatient Medications  Medication Sig Dispense Refill  . acetaminophen-codeine (TYLENOL #3) 300-30 MG tablet 1-2 tablets as needed.     Marland Kitchen albuterol (VENTOLIN HFA) 108 (90 Base) MCG/ACT inhaler Inhale 2 puffs into the lungs every 4 (four) hours as needed for wheezing or shortness of breath. 8 g 2  . atorvastatin (LIPITOR) 40 MG tablet Take 1 tablet (40 mg total) by mouth daily. 90 tablet 3  . colchicine 0.6 MG tablet Take 1-2 tablets by mouth daily as needed (gout).     Marland Kitchen diclofenac (VOLTAREN) 75 MG EC tablet Take 75 mg by mouth 2 (two) times daily.    Marland Kitchen diltiazem (CARDIZEM CD) 180 MG 24 hr capsule Take 1 capsule (180 mg total) by mouth daily. 90 capsule 3  . ENBREL SURECLICK 50 MG/ML injection Inject 50 mg into the skin once a week.     . febuxostat (ULORIC) 40 MG tablet Take 1 tablet by mouth daily.    . fluticasone (FLONASE) 50 MCG/ACT nasal spray SPRAY ONE SPRAY IN EACH NOSTRIL TWICE DAILY 16 g 4  . Fluticasone-Salmeterol (ADVAIR) 250-50 MCG/DOSE AEPB Inhale 1 puff into the lungs every 12 (twelve) hours. 60 each 11  . folic acid (FOLVITE) 1 MG tablet Take 1 tablet by mouth daily.    Marland Kitchen gabapentin (NEURONTIN) 600 MG tablet Take 600 mg by mouth 3 (three)  times daily.     . hydrALAZINE (APRESOLINE) 100 MG tablet Take 1 tablet (100 mg total) by mouth 3 (three) times daily. 270 tablet 0  . loratadine (CLARITIN) 10 MG tablet Take 1 tablet (10 mg total) by mouth daily. 90 tablet 3  . losartan (COZAAR) 100 MG tablet Take 1 tablet (100 mg total) by mouth daily. Please keep upcoming appt in January 2022 with Dr. Mayford Knife before anymore refills. Thank you 30 tablet 0  . methotrexate 2.5 MG tablet Take 15 mg by mouth once a week.    . montelukast (SINGULAIR) 10 MG tablet TAKE ONE TABLET BY MOUTH EVERY NIGHT AT BEDTIME 90 tablet 0  . nebivolol (BYSTOLIC) 5 MG tablet TAKE TWO TABLETS BY MOUTH EVERY NIGHT AT BEDTIME 180 tablet 3  . potassium chloride SA (KLOR-CON M20) 20 MEQ tablet Take 2 tablets (40 mEq total) by mouth 2 (two) times daily. Take 1 tablet by mouth daily at lunch. 450 tablet 3  . predniSONE (DELTASONE) 5 MG tablet Take 10 mg by mouth daily.     . sildenafil (REVATIO) 20 MG tablet Take 1 tablet by mouth daily as needed.    . furosemide (LASIX) 40 MG tablet Take 1 tablet (40 mg total) by mouth 2 (two) times daily. 180 tablet 3   No current facility-administered medications for this visit.  Past Medical History:  Diagnosis Date  . Ascending aortic aneurysm (HCC)    4.4cm on CTA 11/2020 and 4.6cm by echo 02/2021  . Bilateral lower extremity edema   . Chronic gout without tophus    followed by dr Sharmon Revere    (06-08-2020  per pt last episode right knee 3 wks ago)  . CKD (chronic kidney disease), stage II    nephrology--- [facility] (10-29-2019 note in epic scanned in  media)  . Hypertension    followed by cardiology, dr t. turner   (05-14-2018 nuclear study in epic , normal perfusion with nuclear ef 61%)  . Left hydrocele   . OSA on CPAP    per pt uses every night  . Palpitations followed by dr t. Mayford Knife   06-08-2020  still feels palipations due to PVCs when exertion but not with chest pain/ discomfort  . PVC's (premature  ventricular contractions) cardiologist--- dr t. turner   Status post PVC ablation by Dr. Ladona Ridgel 2019 with recurrence of frequent PVCs/bigeminy;  prior pseudobradycardia r/t pvcs  . Rheumatoid arthritis involving multiple sites [facility])    rheumotology--- dr a. ziolkowska  ([facility] in [place])    ROS:   All systems reviewed and negative except as noted in the HPI.   Past Surgical History:  Procedure Laterality Date  . HYDROCELE EXCISION Left 06/14/2020   Procedure: LEFT  HYDROCELECTOMY ADULT;  Surgeon: Noel Christmas, MD;  Location: [facility]  Service: Urology;  Laterality: Left;  . INCISIONAL HERNIA REPAIR  02-23-2016   @[facility]    LAPAROSCOPIC  . LAPAROSCOPIC INGUINAL HERNIA REPAIR Bilateral 08-22-2015  @[facility]    AND UMBILICAL HERNIA REPAIR  . PVC ABLATION N/A 10/07/2018   Procedure: PVC ABLATION;  Surgeon: , MD;  Location: [facility] IN[facility];  Service: Cardiovascular;  Laterality: N/A;  . UMBILICAL HERNIA REPAIR  child     Family History  Problem Relation Age of Onset  . Cardiomyopathy Mother   . Heart attack Father      Social History   Socioeconomic History  . Marital status: Married    Spouse name: Not on file  . Number of children: Not on file  . Years of education: Not on file  . Highest education level: Not on file  Occupational History  . Not on file  Tobacco Use  . Smoking status: Never Smoker  . Smokeless tobacco: Never Used  Vaping Use  . Vaping Use: Never used  Substance and Sexual Activity  . Alcohol use: Yes    Alcohol/week: 5.0 - 7.0 standard drinks    Types: 5 - 7 Cans of beer per week  . Drug use: Never  . Sexual activity: Not on file  Other Topics Concern  . Not on file  Social History Narrative  . Not on file   Social Determinants of Health   Financial Resource Strain: Not on file  Food Insecurity: Not on file  Transportation Needs: Not on file  Physical Activity: Not on file  Stress: Not on file  Social Connections:  Not on file  Intimate Partner Violence: Not on file     BP 134/80   Pulse 73   Ht 5\' 10"  (1.778 m)   Wt 257 lb (116.6 kg)   BMI 36.88 kg/m   Physical Exam:  Well appearing NAD HEENT: Unremarkable Neck:  No JVD, no thyromegally Lymphatics:  No adenopathy Back:  No CVA tenderness Lungs:  Clear with no wheezes HEART:  Regular  rate rhythm, no murmurs, no rubs, no clicks Abd:  soft, positive bowel sounds, no organomegally, no rebound, no guarding Ext:  2 plus pulses, no edema, no cyanosis, no clubbing Skin:  No rashes no nodules Neuro:  CN II through XII intact, motor grossly intact  EKG - nsr with RBBB   Assess/Plan: 1. PVC's - he has developed recurrent PVCs from a different location in the heart. I could ablate but I am certain he would develop yet another spot. I do not think medical therapy is indicated, at least not any AA drugs. He could be switched to a beta blocker. I will defer to Dr. Mayford Knife. 2. Diastolic heart failure -he will increase the lasix to 40 bid and recheck labs. 3. AAA - follow 4. Stage 3 renal insufficiency - he will need this followed carefully with changes in his lasix dose.  Sharlot Gowda Romy Mcgue,MD

## 2021-03-15 NOTE — Patient Instructions (Addendum)
Medication Instructions:  Your physician has recommended you make the following change in your medication:   1.  INCREASE your furosemide 40 mg-  Take one tablet by mouth twice a day  Labwork: You will need a basic metabolic profile (BMP) in 2 weeks.  Please fax results to attn:  Boneta Lucks at [phone]  Testing/Procedures: None ordered.  Follow-Up: Your physician wants you to follow-up in: as needed with [name], MD.  Any Other Special Instructions Will Be Listed Below (If Applicable).  If you need a refill on your cardiac medications before your next appointment, please call your pharmacy.

## 2021-03-16 NOTE — Addendum Note (Signed)
Addended by: Roney Mans A on: 03/16/2021 12:50 PM   Modules accepted: Orders

## 2021-03-30 ENCOUNTER — Other Ambulatory Visit: Payer: Self-pay

## 2021-03-30 ENCOUNTER — Ambulatory Visit: Payer: Self-pay | Admitting: Registered Nurse

## 2021-03-30 ENCOUNTER — Encounter: Payer: Self-pay | Admitting: Registered Nurse

## 2021-03-30 ENCOUNTER — Ambulatory Visit: Payer: Self-pay | Admitting: *Deleted

## 2021-03-30 VITALS — BP 149/102 | HR 60 | Temp 97.8°F

## 2021-03-30 VITALS — BP 155/92 | HR 63

## 2021-03-30 DIAGNOSIS — Z79899 Other long term (current) drug therapy: Secondary | ICD-10-CM

## 2021-03-30 DIAGNOSIS — J019 Acute sinusitis, unspecified: Secondary | ICD-10-CM

## 2021-03-30 DIAGNOSIS — R030 Elevated blood-pressure reading, without diagnosis of hypertension: Secondary | ICD-10-CM

## 2021-03-30 MED ORDER — PREDNISONE 10 MG PO TABS: 20.0000 mg | ORAL_TABLET | Freq: Every day | ORAL | 0 refills | Status: AC

## 2021-03-30 NOTE — Patient Instructions (Signed)
Allergic Rhinitis, Adult  Allergic rhinitis is an allergic reaction that affects the mucous membrane inside the nose. The mucous membrane is the tissue that produces mucus. There are two types of allergic rhinitis:  Seasonal. This type is also called hay fever and happens only during certain seasons.  Perennial. This type can happen at any time of the year. Allergic rhinitis cannot be spread from person to person. This condition can be mild, moderate, or severe. It can develop at any age and may be outgrown. What are the causes? This condition is caused by allergens. These are things that can cause an allergic reaction. Allergens may differ for seasonal allergic rhinitis and perennial allergic rhinitis.  Seasonal allergic rhinitis is triggered by pollen. Pollen can come from grasses, trees, and weeds.  Perennial allergic rhinitis may be triggered by: ? Dust mites. ? Proteins in a pet's urine, saliva, or dander. Dander is dead skin cells from a pet. ? Smoke, mold, or car fumes. What increases the risk? You are more likely to develop this condition if you have a family history of allergies or other conditions related to allergies, including:  Allergic conjunctivitis. This is inflammation of parts of the eyes and eyelids.  Asthma. This condition affects the lungs and makes it hard to breathe.  Atopic dermatitis or eczema. This is long term (chronic) inflammation of the skin.  Food allergies. What are the signs or symptoms? Symptoms of this condition include:  Sneezing or coughing.  A stuffy nose (nasal congestion), itchy nose, or nasal discharge.  Itchy eyes and tearing of the eyes.  A feeling of mucus dripping down the back of your throat (postnasal drip).  Trouble sleeping.  Tiredness or fatigue.  Headache.  Sore throat. How is this diagnosed? This condition may be diagnosed with your symptoms, medical history, and physical exam. Your health care provider may check for  related conditions, such as:  Asthma.  Pink eye. This is eye inflammation caused by infection (conjunctivitis).  Ear infection.  Upper respiratory infection. This is an infection in the nose, throat, or upper airways. You may also have tests to find out which allergens trigger your symptoms. These may include skin tests or blood tests. How is this treated? There is no cure for this condition, but treatment can help control symptoms. Treatment may include:  Taking medicines that block allergy symptoms, such as corticosteroids and antihistamines. Medicine may be given as a shot, nasal spray, or pill.  Avoiding any allergens.  Being exposed again and again to tiny amounts of allergens to help you build a defense against allergens (immunotherapy). This is done if other treatments have not helped. It may include: ? Allergy shots. These are injected medicines that have small amounts of allergen in them. ? Sublingual immunotherapy. This involves taking small doses of a medicine with allergen in it under your tongue. If these treatments do not work, your health care provider may prescribe newer, stronger medicines. Follow these instructions at home: Avoiding allergens Find out what you are allergic to and avoid those allergens. These are some things you can do to help avoid allergens:  If you have perennial allergies: ? Replace carpet with wood, tile, or vinyl flooring. Carpet can trap dander and dust. ? Do not smoke. Do not allow smoking in your home. ? Change your heating and air conditioning filters at least once a month.  If you have seasonal allergies, take these steps during allergy season: ? Keep windows closed as much as possible. ?   Plan outdoor activities when pollen counts are lowest. Check pollen counts before you plan outdoor activities. ? When coming indoors, change clothing and shower before sitting on furniture or bedding.  If you have a pet in the house that produces  allergens: ? Keep the pet out of the bedroom. ? Vacuum, sweep, and dust regularly. General instructions  Take over-the-counter and prescription medicines only as told by your health care provider.  Drink enough fluid to keep your urine pale yellow.  Keep all follow-up visits as told by your health care provider. This is important. Where to find more information  American Academy of Allergy, Asthma & Immunology: www.aaaai.org Contact a health care provider if:  You have a fever.  You develop a cough that does not go away.  You make whistling sounds when you breathe (wheeze).  Your symptoms slow you down or stop you from doing your normal activities each day. Get help right away if:  You have shortness of breath. This symptom may represent a serious problem that is an emergency. Do not wait to see if the symptom will go away. Get medical help right away. Call your local emergency services (911 in the [place] Do not drive yourself to the hospital. Summary  Allergic rhinitis may be managed by taking medicines as directed and avoiding allergens.  If you have seasonal allergies, keep windows closed as much as possible during allergy season.  Contact your health care provider if you develop a fever or a cough that does not go away. This information is not intended to replace advice given to you by your health care provider. Make sure you discuss any questions you have with your health care provider. Document Revised: 01/15/2020 Document Reviewed: 11/24/2019 Elsevier Patient Education  2021 [employer] Nonallergic Rhinitis Nonallergic rhinitis is inflammation of the mucous membrane inside the nose. The mucous membrane is the tissue that produces mucus. This condition is different from having allergic rhinitis, which is an allergy that affects the nose. Allergic rhinitis occurs when the body's defense system, or immune system, reacts to a substance that a person is allergic to  (allergen), such as pollen, pet dander, mold, or dust. Nonallergic rhinitis has many similar symptoms, but it is not caused by allergens. Nonallergic rhinitis can be an acute or chronic problem. This means it can be short-term or long-term. What are the causes? This condition may be caused by many different things. Some common types of nonallergic rhinitis include:  Infectious rhinitis. This is usually caused by an infection in the nose, throat, or upper airways (upper respiratory system).  Vasomotor rhinitis. This is the most common type of chronic nonallergic rhinitis. It is caused by too much blood flow through your nose, and it leads to swelling in your nose. It is triggered by strong odors, cold air, stress, drinking alcohol, cigarette smoke, or changes in the weather.  Occupational rhinitis. This type is caused by triggers in the workplace, such as chemicals, dust, animal dander, or air pollution.  Hormonal rhinitis, in teenage girls and women. This type is caused by an increase in the hormone estrogen and may happen during pregnancy, puberty, or monthly menstrual periods. Hormonal rhinitis gives you fewer symptoms when estrogen levels drop.  Drug-induced rhinitis. Several types of medicines can cause this, such as medicines for high blood pressure or heart disease, aspirin, or NSAIDs.  Nonallergic rhinitis with eosinophilia syndrome (NARES). This type is caused by having too much eosinophil, a type of white blood cell. Other causes  include a reaction to eating hot or spicy foods. This does not usually cause long-term symptoms. In some cases, the cause of nonallergic rhinitis is not known. What increases the risk? You are more likely to develop this condition if:  You are 46-57 years of age.  You are a woman. Women are twice as likely to have this condition. What are the signs or symptoms? Common symptoms of this condition include:  Stuffy nose (nasal congestion).  Runny nose.  A  feeling of mucus dripping down the back of your throat (postnasal drip).  Trouble sleeping.  Tiredness, or fatigue. Other symptoms include:  Sneezing.  Coughing.  Itchy nose.  Bloodshot eyes. How is this diagnosed? This type may be diagnosed based on:  Your symptoms and medical history.  A physical exam.  Allergy testing to rule out allergic rhinitis. You may have skin tests or blood tests. Your health care provider may also take a swab of nasal discharge to look for an increased number of eosinophils. This would be done to confirm a diagnosis of NARES. How is this treated? Treatment for this condition depends on the cause. No single treatment works for everyone. Work with your health care provider to find the best treatment for you. Treatment may include:  Avoiding the things that trigger your symptoms.  Medicines to relieve congestion, such as: ? Steroid nasal spray. There are many types. You may need to try a few to find out which one works best. ? Decongestant medicine. This treats nasal congestion and may be given by mouth or as a nasal spray. These medicines are used only for a short time.  Medicines to relieve a runny nose. These may include antihistamine medicines or anticholinergic nasal sprays.  Nasal irrigation. This involves using a salt-water (saline) spray or saline container called a neti pot. Nasal irrigation helps to clear away mucus and keep your nasal passages moist.  Surgery to remove part of your mucous membrane. This is done in severe cases if the condition has not improved after 6-12 months of treatment.   Follow these instructions at home: Medicines  Take or use over-the-counter and prescription medicines only as told by your health care provider. Do not stop using your medicine even if you start to feel better.  Do not take NSAIDs, such as ibuprofen, or medicines that contain aspirin if they make your symptoms worse. Lifestyle  Do not drink  alcohol if it makes your symptoms worse.  Do not use any products that contain nicotine or tobacco, such as cigarettes, e-cigarettes, and chewing tobacco. If you need help quitting, ask your health care provider.  Avoid secondhand smoke. General instructions  Avoid triggers that make your symptoms worse.  Use nasal irrigation as told by your health care provider.  Get exercise. Exercise may help reduce symptoms for some people.  Sleep with the head of your bed raised. This may reduce nasal congestion when you sleep.  Drink enough fluid to keep your urine pale yellow.  Keep all follow-up visits as told by your health care provider. This is important. Contact a health care provider if:  You have a fever.  Your symptoms are getting worse at home.  Your symptoms do not lessen with medicine.  You develop new symptoms, especially a headache or nosebleed. Summary  Nonallergic rhinitis is inflammation inside the nose that is not caused by allergens. Nonallergic rhinitis can be a short-term or long-term problem.  Treatment may include avoiding the things that trigger your symptoms.  Take or use over-the-counter and prescription medicines only as told by your health care provider. Do not stop using your medicine even if you start to feel better.  Contact a health care provider if your symptoms do not lessen with medicine. This information is not intended to replace advice given to you by your health care provider. Make sure you discuss any questions you have with your health care provider. Document Revised: 10/05/2019 Document Reviewed: 10/05/2019 Elsevier Patient Education  2021 [employer] Inc. How to Perform a Sinus Rinse A sinus rinse is a home treatment that is used to rinse your sinuses with a sterile mixture of salt and water (saline solution). Sinuses are air-filled spaces in your skull behind the bones of your face and forehead that open into your nasal cavity. A sinus rinse can  help to clear mucus, dirt, dust, or pollen from your nasal cavity. You may do a sinus rinse when you have a cold, a virus, nasal allergy symptoms, a sinus infection, or stuffiness in your nose or sinuses. Talk with your health care provider about whether a sinus rinse might help you. What are the risks? A sinus rinse is generally safe and effective. However, there are a few risks, which include:  A burning sensation in your sinuses. This may happen if you do not make the saline solution as directed. Be sure to follow all directions when making the saline solution.  Nasal irritation.  Infection from contaminated water. This is rare, but possible. Do not do a sinus rinse if you have had ear or nasal surgery, ear infection, or blocked ears. Supplies needed:  Saline solution or powder.  Distilled or sterile water to mix with saline powder. ? You may use boiled and cooled tap water. Boil tap water for 5 minutes; cool until it is lukewarm. Use within 24 hours. ? Do not use regular tap water to mix with the saline solution.  Neti pot or nasal rinse bottle. These supplies release the saline solution into your nose and through your sinuses. Neti pots and nasal rinse bottles can be purchased at [employer], a health food store, or online. How to perform a sinus rinse 1. Wash your hands with soap and water. 2. Wash your device according to the directions that came with the product and then dry it. 3. Use the solution that comes with your product or one that is sold separately in stores. Follow the mixing directions on the package to mix with sterile or distilled water. 4. Fill the device with the amount of saline solution noted in the device instructions. 5. Stand over a sink and tilt your head sideways over the sink. 6. Place the spout of the device in your upper nostril (the one closer to the ceiling). 7. Gently pour or squeeze the saline solution into your nasal cavity. The liquid should  drain out from the lower nostril if you are not too congested. 8. While rinsing, breathe through your open mouth. 9. Gently blow your nose to clear any mucus and rinse solution. Blowing too hard may cause ear pain. 10. Repeat in your other nostril. 11. Clean and rinse your device with clean water and then air-dry it. Talk with your health care provider or pharmacist if you have questions about how to do a sinus rinse.   Summary  A sinus rinse is a home treatment that is used to rinse your sinuses with a sterile mixture of salt and water (saline solution).  A sinus rinse  is generally safe and effective. Follow all instructions carefully.  Before doing a sinus rinse, talk with your health care provider about whether it would be helpful for you. This information is not intended to replace advice given to you by your health care provider. Make sure you discuss any questions you have with your health care provider. Document Revised: 09/06/2020 Document Reviewed: 09/06/2020 Elsevier Patient Education  2021 [employer]. Sinusitis, Adult Sinusitis is inflammation of your sinuses. Sinuses are hollow spaces in the bones around your face. Your sinuses are located:  Around your eyes.  In the middle of your forehead.  Behind your nose.  In your cheekbones. Mucus normally drains out of your sinuses. When your nasal tissues become inflamed or swollen, mucus can become trapped or blocked. This allows bacteria, viruses, and fungi to grow, which leads to infection. Most infections of the sinuses are caused by a virus. Sinusitis can develop quickly. It can last for up to 4 weeks (acute) or for more than 12 weeks (chronic). Sinusitis often develops after a cold. What are the causes? This condition is caused by anything that creates swelling in the sinuses or stops mucus from draining. This includes:  Allergies.  Asthma.  Infection from bacteria or viruses.  Deformities or blockages in your nose or  sinuses.  Abnormal growths in the nose (nasal polyps).  Pollutants, such as chemicals or irritants in the air.  Infection from fungi (rare). What increases the risk? You are more likely to develop this condition if you:  Have a weak body defense system (immune system).  Do a lot of swimming or diving.  Overuse nasal sprays.  Smoke. What are the signs or symptoms? The main symptoms of this condition are pain and a feeling of pressure around the affected sinuses. Other symptoms include:  Stuffy nose or congestion.  Thick drainage from your nose.  Swelling and warmth over the affected sinuses.  Headache.  Upper toothache.  A cough that may get worse at night.  Extra mucus that collects in the throat or the back of the nose (postnasal drip).  Decreased sense of smell and taste.  Fatigue.  A fever.  Sore throat.  Bad breath. How is this diagnosed? This condition is diagnosed based on:  Your symptoms.  Your medical history.  A physical exam.  Tests to find out if your condition is acute or chronic. This may include: ? Checking your nose for nasal polyps. ? Viewing your sinuses using a device that has a light (endoscope). ? Testing for allergies or bacteria. ? Imaging tests, such as an MRI or CT scan. In rare cases, a bone biopsy may be done to rule out more serious types of fungal sinus disease. How is this treated? Treatment for sinusitis depends on the cause and whether your condition is chronic or acute.  If caused by a virus, your symptoms should go away on their own within 10 days. You may be given medicines to relieve symptoms. They include: ? Medicines that shrink swollen nasal passages (topical intranasal decongestants). ? Medicines that treat allergies (antihistamines). ? A spray that eases inflammation of the nostrils (topical intranasal corticosteroids). ? Rinses that help get rid of thick mucus in your nose (nasal saline washes).  If caused by  bacteria, your health care provider may recommend waiting to see if your symptoms improve. Most bacterial infections will get better without antibiotic medicine. You may be given antibiotics if you have: ? A severe infection. ? A weak immune  system.  If caused by narrow nasal passages or nasal polyps, you may need to have surgery. Follow these instructions at home: Medicines  Take, use, or apply over-the-counter and prescription medicines only as told by your health care provider. These may include nasal sprays.  If you were prescribed an antibiotic medicine, take it as told by your health care provider. Do not stop taking the antibiotic even if you start to feel better. Hydrate and humidify  Drink enough fluid to keep your urine pale yellow. Staying hydrated will help to thin your mucus.  Use a cool mist humidifier to keep the humidity level in your home above 50%.  Inhale steam for 10-15 minutes, 3-4 times a day, or as told by your health care provider. You can do this in the bathroom while a hot shower is running.  Limit your exposure to cool or dry air.   Rest  Rest as much as possible.  Sleep with your head raised (elevated).  Make sure you get enough sleep each night. General instructions  Apply a warm, moist washcloth to your face 3-4 times a day or as told by your health care provider. This will help with discomfort.  Wash your hands often with soap and water to reduce your exposure to germs. If soap and water are not available, use hand sanitizer.  Do not smoke. Avoid being around people who are smoking (secondhand smoke).  Keep all follow-up visits as told by your health care provider. This is important.   Contact a health care provider if:  You have a fever.  Your symptoms get worse.  Your symptoms do not improve within 10 days. Get help right away if:  You have a severe headache.  You have persistent vomiting.  You have severe pain or swelling around your  face or eyes.  You have vision problems.  You develop confusion.  Your neck is stiff.  You have trouble breathing. Summary  Sinusitis is soreness and inflammation of your sinuses. Sinuses are hollow spaces in the bones around your face.  This condition is caused by nasal tissues that become inflamed or swollen. The swelling traps or blocks the flow of mucus. This allows bacteria, viruses, and fungi to grow, which leads to infection.  If you were prescribed an antibiotic medicine, take it as told by your health care provider. Do not stop taking the antibiotic even if you start to feel better.  Keep all follow-up visits as told by your health care provider. This is important. This information is not intended to replace advice given to you by your health care provider. Make sure you discuss any questions you have with your health care provider. Document Revised: 04/28/2018 Document Reviewed: 04/28/2018 Elsevier Patient Education  2021 [employer].

## 2021-03-30 NOTE — Telephone Encounter (Signed)
RN Rolly Salter saw patient on 03/28/21 for allergies needing more nasal saline.  Contacted patient via telephone today and he feels has sinusitis now and lots of drainage would like NP appt.  Scheduled for 1230 today with me.  Patient verbalized understanding information/instructions, agreed with plan of care and had no further questions at this time.

## 2021-03-30 NOTE — Progress Notes (Signed)
BMP per EP orders after increasing Lasix dose. Pt reports he has to time taking pill just right around his schedule/meetings as he has to urinate every 10-15 minutes for the first 3 hours after taking it.

## 2021-03-30 NOTE — Progress Notes (Signed)
Subjective:    Patient ID: Ryan Glass, male    DOB: 1955-12-13, 65 y.o.   MRN: [id number]  65y/o b[place]ian married male established patient here for evaluation nasal congestion possible sinus infection.  Allergies have been flaring with spring bloom.  Using nasal saline, oral antihistamine unsure which one bought bottle allergy pills at [facility], and nasal Flonase.  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  Having sinus pain/pressure but denied headache/teeth pain/dental problem/ear discharge/fever/chills.  Typically a few days prednisone helps to resolve but patient wondering if he needs antibiotic this time.  Discharge clear still not thick or chunky or foul taste in mouth or smell in nose.  Feeling worse than usual allergies.  Denied sick contacts.  Has had 3 covid vaccinations on methotrexate immunocompromised but has not received second booster yet.  Not having any trouble sleeping.  Rheumatology changing from embrel to humira for arthritis and awaiting insurance authorization approval to pick up new medication  Still on methotrexate and prednisone also.     Review of Systems  Constitutional: Positive for fatigue. Negative for activity change, appetite change, chills, diaphoresis and fever.  HENT: Positive for congestion, postnasal drip, rhinorrhea, sinus pressure, sinus pain and sneezing. Negative for dental problem, drooling, ear discharge, ear pain, facial swelling, hearing loss, mouth sores, nosebleeds, sore throat, tinnitus, trouble swallowing and voice change.   Eyes: Positive for itching. Negative for photophobia, pain, discharge and visual disturbance.  Respiratory: Negative for cough, choking, shortness of breath, wheezing and stridor.   Cardiovascular: Negative for chest pain.  Gastrointestinal: Negative for diarrhea, nausea and vomiting.  Endocrine: Negative for cold intolerance and heat intolerance.  Genitourinary: Negative for difficulty urinating.  Musculoskeletal:  Positive for arthralgias, joint swelling and myalgias. Negative for gait problem, neck pain and neck stiffness.  Skin: Negative for rash.  Allergic/Immunologic: Positive for environmental allergies. Negative for food allergies.  Neurological: Negative for dizziness, tremors, seizures, syncope, facial asymmetry, speech difficulty, weakness, light-headedness, numbness and headaches.  Hematological: Negative for adenopathy. Does not bruise/bleed easily.  Psychiatric/Behavioral: Negative for agitation, confusion and sleep disturbance.       Objective:   Physical Exam Vitals and nursing note reviewed.  Constitutional:      General: He is awake. He is not in acute distress.    Appearance: Normal appearance. He is well-developed and well-groomed. He is obese. He is ill-appearing. He is not toxic-appearing or diaphoretic.  HENT:     Head: Normocephalic and atraumatic.     Jaw: There is normal jaw occlusion. No trismus, tenderness or pain on movement.     Salivary Glands: Right salivary gland is not diffusely enlarged or tender. Left salivary gland is not diffusely enlarged or tender.     Right Ear: Hearing, ear canal and external ear normal. A middle ear effusion is present. There is no impacted cerumen.     Left Ear: Hearing, ear canal and external ear normal. A middle ear effusion is present. There is no impacted cerumen.     Nose: Mucosal edema, congestion and rhinorrhea present. No nasal deformity, septal deviation, signs of injury, laceration or nasal tenderness. Rhinorrhea is clear.     Right Turbinates: Enlarged and swollen. Not pale.     Left Turbinates: Enlarged and swollen. Not pale.     Right Sinus: No maxillary sinus tenderness or frontal sinus tenderness.     Left Sinus: No maxillary sinus tenderness or frontal sinus tenderness.     Mouth/Throat:  Lips: Pink. No lesions.     Mouth: Mucous membranes are moist. Mucous membranes are not pale, not dry and not cyanotic. No injury,  lacerations, oral lesions or angioedema.     Dentition: No dental caries, dental abscesses or gum lesions.     Tongue: No lesions. Tongue does not deviate from midline.     Palate: No mass and lesions.     Pharynx: Uvula midline. Pharyngeal swelling and posterior oropharyngeal erythema present. No oropharyngeal exudate or uvula swelling.     Tonsils: No tonsillar exudate or tonsillar abscesses.     Comments: Cobblestoning posterior pharynx; bilateral allergic shiners; lower eyelids 1+/4 nonpitting edema; bilateral TMs air fluid level clear; clear discharge bilateral nasal turbinates edema/erythema/enlarged; voice nasally/congestion noted Eyes:     General: Lids are normal. Vision grossly intact. Gaze aligned appropriately. Allergic shiner present. No visual field deficit or scleral icterus.       Right eye: No foreign body, discharge or hordeolum.        Left eye: No foreign body, discharge or hordeolum.     Extraocular Movements: Extraocular movements intact.     Right eye: Normal extraocular motion and no nystagmus.     Left eye: Normal extraocular motion and no nystagmus.     Conjunctiva/sclera: Conjunctivae normal.     Right eye: Right conjunctiva is not injected. No chemosis, exudate or hemorrhage.    Left eye: Left conjunctiva is not injected. No chemosis, exudate or hemorrhage.    Pupils: Pupils are equal, round, and reactive to light. Pupils are equal.     Right eye: Pupil is round and reactive.     Left eye: Pupil is round and reactive.  Neck:     Trachea: Trachea and phonation normal. No tracheal tenderness or tracheal deviation.  Cardiovascular:     Rate and Rhythm: Normal rate and regular rhythm.     Chest Wall: PMI is not displaced.     Pulses:          Radial pulses are 2+ on the right side and 2+ on the left side.     Heart sounds: Normal heart sounds, S1 normal and S2 normal. No murmur heard. No friction rub. No gallop.      Comments: Trace bilateral lower extremity edema   Pulmonary:     Effort: Pulmonary effort is normal. No respiratory distress.     Breath sounds: Normal breath sounds and air entry. No stridor or transmitted upper airway sounds. No decreased breath sounds, wheezing, rhonchi or rales.     Comments: Spoke full sentences without difficulty; no cough/throat clearing noted in clinic Abdominal:     General: There is no distension.     Palpations: Abdomen is soft.  Musculoskeletal:        General: No swelling, tenderness, deformity or signs of injury. Normal range of motion.     Cervical back: Normal range of motion and neck supple. No edema, erythema, signs of trauma, rigidity, torticollis or crepitus. Normal range of motion.     Right lower leg: Edema present.     Left lower leg: Edema present.  Lymphadenopathy:     Head:     Right side of head: No submental, submandibular, tonsillar, preauricular, posterior auricular or occipital adenopathy.     Left side of head: No submental, submandibular, tonsillar, preauricular, posterior auricular or occipital adenopathy.     Cervical: No cervical adenopathy.     Right cervical: No superficial, deep or posterior cervical adenopathy.  Left cervical: No superficial, deep or posterior cervical adenopathy.  Skin:    General: Skin is warm and dry.     Capillary Refill: Capillary refill takes less than 2 seconds.     Coloration: Skin is not ashen, cyanotic, jaundiced, mottled, pale or sallow.     Findings: No abrasion, abscess, acne, bruising, burn, ecchymosis, erythema, signs of injury, laceration, lesion, petechiae, rash or wound.     Nails: There is no clubbing.  Neurological:     General: No focal deficit present.     Mental Status: He is alert and oriented to person, place, and time. Mental status is at baseline.     GCS: GCS eye subscore is 4. GCS verbal subscore is 5. GCS motor subscore is 6.     Cranial Nerves: Cranial nerves are intact. No cranial nerve deficit, dysarthria or facial asymmetry.      Sensory: Sensation is intact. No sensory deficit.     Motor: Motor function is intact. No weakness, tremor, atrophy, abnormal muscle tone or seizure activity.     Coordination: Coordination is intact. Coordination normal.     Gait: Gait is intact. Gait normal.     Comments: In/out of chair without difficulty; gait sure and steady in clinic; bilateral hand grasp equal 5/5  Psychiatric:        Attention and Perception: Attention and perception normal.        Mood and Affect: Mood and affect normal.        Speech: Speech normal.        Behavior: Behavior normal. Behavior is cooperative.        Thought Content: Thought content normal.        Cognition and Memory: Cognition and memory normal.        Judgment: Judgment normal.           Assessment & Plan:  A-acute rhinosinusitis and elevated blood pressure reading  P-Continue flonase 1 spray each nostril BID, saline 2 sprays each nostril q2h wa prn congestion given 1 bottle to use at work.  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  Discussed with patient to wear mask when polishing as dust exposure will worsen allergy symptoms.  Use nasal saline on regular basis/on breaks when at work and shower after work at home/prior to bed.  Prednisone 20mg  po with breakfast x 5 days has previously dispensed bottle at home from [facility] pills left over.  Holding antibiotics as clear discharge most likely allergic or viral.  Consider covid/flu testing if worsening symptoms.  Patient to take picture of allergy medication from [employer] and show to clinic staff.    If no improvement with 48 hours of prednisone, saline and flonase use notify clinic staff via email [email or web address], mychart or [phone].  Amoxicillin/doxycycline can elevate methotrexate blood levels per epocrates.  Consider keflex if antibiotics needed.  Denied personal or family history of ENT cancer.  Shower BID especially prior to bed. No evidence of systemic bacterial  infection, non toxic and well hydrated.  I do not see where any further testing or imaging is necessary at this time.   I will suggest supportive care, rest, good hygiene and encourage the patient to take adequate fluids.  The patient is to return to clinic or EMERGENCY ROOM if symptoms worsen or change significantly.  Exitcare handout on sinusitis, allergic rhinitis, nonallergic rhinitis and sinus rinse printed and given to patient.  Patient verbalized agreement and understanding of treatment plan and had no  further questions at this time.   P2:  Hand washing and cover cough  Exertion at work today and acute illness worsening blood pressure will continue to monitor at subsequent visits.  Ankle edema improved with medication changes from cardiology trace consistently now versus 1-2+ on bad days pitting.  Keep regularly scheduled follow up appts with cardiology/PCM.  Continue home BP log and see RN Rolly Salter for BP check at work if symptoms worsening/changing.  Patient is taking medications as prescribed.  Patient verbalized understanding information/instructions, agreed with plan of care and had no further questions at this time.

## 2021-03-31 LAB — BASIC METABOLIC PANEL
BUN/Creatinine Ratio: 19 (ref 10–24)
BUN: 24 mg/dL (ref 8–27)
CO2: 18 mmol/L — ABNORMAL LOW (ref 20–29)
Calcium: 9.3 mg/dL (ref 8.6–10.2)
Chloride: 103 mmol/L (ref 96–106)
Creatinine, Ser: 1.28 mg/dL — ABNORMAL HIGH (ref 0.76–1.27)
Glucose: 94 mg/dL (ref 65–99)
Potassium: 4 mmol/L (ref 3.5–5.2)
Sodium: 140 mmol/L (ref 134–144)
eGFR: 62 mL/min/{1.73_m2} (ref >59)

## 2021-03-31 NOTE — Progress Notes (Signed)
EP MD Ladona Ridgel was CC'd on results. Attempted to contact pt to ask about NSAIDs/Diclofenac. No answer. LVM.

## 2021-03-31 NOTE — Progress Notes (Signed)
Ryan Glass increased his Diclofenac to BID about 2 weeks ago. Said he can drop back down to daily if he needs to. Will increase water intake as well.

## 2021-04-02 ENCOUNTER — Other Ambulatory Visit: Payer: Self-pay | Admitting: Physician Assistant

## 2021-04-02 NOTE — Progress Notes (Signed)
Noted patient had increased diclofenac to BID again recently in addition to furosemide changes.  Cardiology sent results by RN Rolly Salter also.  Will see if cardiology has preference if not will have patient decrease diclofenac to daily prn again instead of BID.

## 2021-04-04 ENCOUNTER — Telehealth: Payer: Self-pay | Admitting: Registered Nurse

## 2021-04-04 DIAGNOSIS — E876 Hypokalemia: Secondary | ICD-10-CM

## 2021-04-04 MED ORDER — POTASSIUM CHLORIDE ER 20 MEQ PO TBCR: EXTENDED_RELEASE_TABLET | ORAL | Status: DC

## 2021-04-04 NOTE — Telephone Encounter (Signed)
Patient has been taking 5-79meq potassium chloride tabs per day and getting fills from [employer] formulary.  Last potassium 4 and on furosemide 40mg  po daily.  Typically gets worsening palpitations when only on 4 tabs per day and when blood K level closer to 3.5  Patient to continue 5 tabs per day while on vacation in next month Rx for patient to take [place] po at lunchtime in addition to 2 tabs po BID from cardiology PA Dunn.  Patient verbalized understanding information/instructions and had no further questions at this time.  Message sent to patient cardiology provider.

## 2021-04-06 NOTE — Telephone Encounter (Signed)
Attempted to follow up with patient not in workcenter and cell phone on his desk.  RN Rolly Salter spoke with patient on Tuesday 04/04/2021 and still having acute rhinosinusitis but no worsening.

## 2021-04-10 NOTE — Telephone Encounter (Signed)
Patient was on am/pm and 20 meq at lunch potassium 4/21.  Will notify patient about 64oz water rule when I am onsite with him tomorrow and I don't think he is exceeding that limit.  He typically drinks coffee and some water.  Thank you!  Albina Billet NP-C

## 2021-04-10 NOTE — Telephone Encounter (Signed)
Hi Tina! I saw where he saw Dr. Ladona Ridgel on 4/6 and he increased his Lasix for his heart failure. Repeat labs on 4/21 showed relatively stable renal function within prior baseline range as well as normal potassium. (Cr mildly up from prior value but stable compared to prior values 1.2-1.3). When the 4/21 labs were checked, to clarify, what was his dose of potassium? That is the dose that I would recommend continuing since his level was normal. Would also make sure he knows that increasing water intake is OK but given CHF should stick to less than 64oz per day max, otherwise it just offsets the Lasix and may frustrate him with increased urine output. Thank you so much! Adrean Findlay

## 2021-04-13 ENCOUNTER — Ambulatory Visit: Payer: Self-pay | Admitting: *Deleted

## 2021-04-13 ENCOUNTER — Other Ambulatory Visit: Payer: Self-pay

## 2021-04-13 VITALS — BP 174/93 | HR 85 | Wt 252.5 lb

## 2021-04-13 DIAGNOSIS — I10 Essential (primary) hypertension: Secondary | ICD-10-CM

## 2021-04-13 NOTE — Telephone Encounter (Addendum)
Patient was sent message and followed up in clinic.  Measured his Yeti cup 30 oz uses ice and water typically fills up 4 times per day breakfast, lunch, afternoon, dinner.  Discussed with patient  max 2 1/2 yetis per day on regular basis due to CHF per cardiology.  Patient verbalized understanding information/instructions, agreed with plan of care and had no further questions at this time.  Last day at work tomorrow and will be on vacation in [place] starting next Tuesday 04/18/2021  Patient seen in clinic today also Has not taken lasix yet today due to meetings and training couldn't leave to go to bathroom planning to take this afternoon.  1-2+/4 pitting edema bilateral lower extremities weight 152lbs with shoes/jeans/polo shirt on utilizing clinic upright scale.  No cough/nasal sniffing/throat clearing in clinic.  Lower eyelids slightly swollen with allergic shiners spoke full sentences without difficulty gait sure and steady skin warm dry and pink respirations even and unlabored A&Ox3.  Discussed travel safety re covid/mask wear/traveler's diarrhea and CDC recommendations for bringing OTC/travel kit meds/supplies with him.  Patient verbalized understanding information/instructions, agreed with plan of care and had no further questions at this time.

## 2021-04-13 NOTE — Telephone Encounter (Signed)
Patient seen 03/30/2021 for labs see results note

## 2021-04-13 NOTE — Progress Notes (Signed)
Routine BP check. Also discussed fluid intake restrictions as pt verbalized some confusion based on what he was told previously by other providers. Re-educated on fluid restrictions r/t CHF and his kidney function. 64oz restriction per Ronie Spies, PA.

## 2021-04-15 NOTE — Telephone Encounter (Signed)
Patient seen in clinic on 04/13/2021 and allergy symptoms greatly improved and no further questions or concerns.  No cough/nasal sniffing or throat clearing noted when patient was in clinic.

## 2021-05-23 ENCOUNTER — Encounter: Payer: Self-pay | Admitting: Registered Nurse

## 2021-05-23 ENCOUNTER — Ambulatory Visit: Payer: Self-pay | Admitting: Registered Nurse

## 2021-05-23 ENCOUNTER — Other Ambulatory Visit: Payer: Self-pay

## 2021-05-23 ENCOUNTER — Telehealth: Payer: Self-pay | Admitting: Cardiology

## 2021-05-23 VITALS — BP 200/90 | HR 48 | Temp 98.3°F | Wt 250.0 lb

## 2021-05-23 DIAGNOSIS — R030 Elevated blood-pressure reading, without diagnosis of hypertension: Secondary | ICD-10-CM

## 2021-05-23 DIAGNOSIS — R0609 Other forms of dyspnea: Secondary | ICD-10-CM

## 2021-05-23 DIAGNOSIS — R0600 Dyspnea, unspecified: Secondary | ICD-10-CM

## 2021-05-23 NOTE — Patient Instructions (Signed)
Shortness of Breath, Adult Shortness of breath means you have trouble breathing. Shortness of breath couldbe a sign of a medical problem. Follow these instructions at home:  Watch for any changes in your symptoms. Do not use any products that contain nicotine or tobacco, such as cigarettes, e-cigarettes, and chewing tobacco. Do not smoke. Smoking can cause shortness of breath. If you need help to quit smoking, ask your doctor. Avoid things that can make it harder to breathe, such as: Mold. Dust. Air pollution. Chemical smells. Things that can cause allergy symptoms (allergens), if you have allergies. Keep your living space clean. Use products that help remove mold and dust. Rest as needed. Slowly return to your normal activities. Take over-the-counter and prescription medicines only as told by your doctor. This includes oxygen therapy and inhaled medicines. Keep all follow-up visits as told by your doctor. This is important. Contact a doctor if: Your condition does not get better as soon as expected. You have a hard time doing your normal activities, even after you rest. You have new symptoms. Get help right away if: Your shortness of breath gets worse. You have trouble breathing when you are resting. You feel light-headed or you pass out (faint). You have a cough that is not helped by medicines. You cough up blood. You have pain with breathing. You have pain in your chest, arms, shoulders, or belly (abdomen). You have a fever. You cannot walk up stairs. You cannot exercise the way you normally do. These symptoms may represent a serious problem that is an emergency. Do not wait to see if the symptoms will go away. Get medical help right away. Call your local emergency services (911 in the [place] Do not drive yourself to the hospital. Summary Shortness of breath is when you have trouble breathing enough air. It can be a sign of a medical problem. Avoid things that make it hard for  you to breathe, such as smoking, pollution, mold, and dust. Watch for any changes in your symptoms. Contact your doctor if you do not get better or you get worse. This information is not intended to replace advice given to you by your health care provider. Make sure you discuss any questions you have with your healthcare provider. Document Revised: 04/28/2018 Document Reviewed: 04/28/2018 Elsevier Patient Education  2022 [employer]. Nonspecific Chest Pain, Adult Chest pain can be caused by many different conditions. It can be caused by a condition that is life-threatening and requires treatment right away. It can also be caused by something that is not life-threatening. If you have chest pain, it can be hard to know the difference, so it is important to get helpright away to make sure that you do not have a serious condition. Some life-threatening causes of chest pain include: Heart attack. A tear in the body's main blood vessel (aortic dissection). Inflammation around your heart (pericarditis). A problem in the lungs, such as a blood clot (pulmonary embolism) or a collapsed lung (pneumothorax). Some non life-threatening causes of chest pain include: Heartburn. Anxiety or stress. Damage to the bones, muscles, and cartilage that make up your chest wall. Pneumonia or bronchitis. Shingles infection (varicella-zoster virus). Chest pain can feel like: Pain or discomfort on the surface of your chest or deep in your chest. Crushing, pressure, aching, or squeezing pain. Burning or tingling. Dull or sharp pain that is worse when you move, cough, or take a deep breath. Pain or discomfort that is also felt in your back, neck, jaw, shoulder,  or arm, or pain that spreads to any of these areas. Your chest pain may come and go. It may also be constant. Your health care provider will do lab tests and other studies to find the cause of your pain.Treatment will depend on the cause of your chest  pain. Follow these instructions at home: Medicines Take over-the-counter and prescription medicines only as told by your health care provider. If you were prescribed an antibiotic, take it as told by your health care provider. Do not stop taking the antibiotic even if you start to feel better. Lifestyle  Rest as directed by your health care provider. Do not use any products that contain nicotine or tobacco, such as cigarettes and e-cigarettes. If you need help quitting, ask your health care provider. Do not drink alcohol. Make healthy lifestyle choices as recommended. These may include: Getting regular exercise. Ask your health care provider to suggest some activities that are safe for you. Eating a heart-healthy diet. This includes plenty of fresh fruits and vegetables, whole grains, low-fat (lean) protein, and low-fat dairy products. A dietitian can help you find healthy eating options. Maintaining a healthy weight. Managing any other health conditions you have, such as high blood pressure (hypertension) or diabetes. Reducing stress, such as with yoga or relaxation techniques.  General instructions Pay attention to any changes in your symptoms. Tell your health care provider about them or any new symptoms. Avoid any activities that cause chest pain. Keep all follow-up visits as told by your health care provider. This is important. This includes visits for any further testing if your chest pain does not go away. Contact a health care provider if: Your chest pain does not go away. You feel depressed. You have a fever. Get help right away if: Your chest pain gets worse. You have a cough that gets worse, or you cough up blood. You have severe pain in your abdomen. You faint. You have sudden, unexplained chest discomfort. You have sudden, unexplained discomfort in your arms, back, neck, or jaw. You have shortness of breath at any time. You suddenly start to sweat, or your skin gets  clammy. You feel nausea or you vomit. You suddenly feel lightheaded or dizzy. You have severe weakness, or unexplained weakness or fatigue. Your heart begins to beat quickly, or it feels like it is skipping beats. These symptoms may represent a serious problem that is an emergency. Do not wait to see if the symptoms will go away. Get medical help right away. Call your local emergency services (911 in the [place] Do not drive yourself to the hospital. Summary Chest pain can be caused by a condition that is serious and requires urgent treatment. It may also be caused by something that is not life-threatening. If you have chest pain, it is very important to see your health care provider. Your health care provider may do lab tests and other studies to find the cause of your pain. Follow your health care provider's instructions on taking medicines, making lifestyle changes, and getting emergency treatment if symptoms become worse. Keep all follow-up visits as told by your health care provider. This includes visits for any further testing if your chest pain does not go away. This information is not intended to replace advice given to you by your health care provider. Make sure you discuss any questions you have with your healthcare provider. Document Revised: 05/29/2018 Document Reviewed: 05/29/2018 Elsevier Patient Education  2022 [employer].

## 2021-05-23 NOTE — Telephone Encounter (Signed)
Left message for Ryan Glass to call back.

## 2021-05-23 NOTE — Telephone Encounter (Signed)
Hayley nurse at the pt's employer is stating the pt is having some abnormal symptoms and the pt needs to be seen today.Advised next available is 8/3.She also stated that there are some clinic notes in Epic

## 2021-05-23 NOTE — Telephone Encounter (Signed)
Spoke with Ryan Glass who states that the patient was seen in clinic today. He reported that he has been having issues with SOB. Reports that SOB started prior to his trip to [place] last week however it has not improved. She states that while on a walk in [place] he developed some chest pressure with radiation to his left arm. He rested and pressure resolved. She reports that the patient has not had anymore episodes of chest pain though continues to be SOB. O2 was 97%. The patient has been given ER precautions. Patient has an appointment with Dr. Mayford Knife tomorrow 6/15

## 2021-05-23 NOTE — Progress Notes (Signed)
Subjective:    Patient ID: Ryan Glass, male    DOB: 24-Nov-1956, 65 y.o.   MRN: [id number]  65y/o [place]ian married male established patient here for evaluation of dypnea with exertion that started while on vacation  Reports when he was walking in large airports he would have to stop to take breaks due to Shortness of breath with exertion. Had some episodes sitting on airplane and would be breathing normally for him but suddenly would become Short of breath like he couldn't catch his breath and this would last 1 minute but every occur every 2-3 minutes so he couldn't rest.  Walking in [place] would become Short of breath and one time developed a pressure/discomfort in the center of chest over to L arm. Had some sweating when short of breath and walking outside in [place] and in the airport.  Resolved with rest. Was on vacation for one month.  Reported it rained a lot there and was eating his favorite foods.  Reports he has never been given nitroglycerin Rx. No active chest pain.  Shopping at [employer] with spouse this weekend he was very out of breath, having trouble with walking from his basement back up to main level of home also this week.  Taking his chronic medications every day.  Not using his albuterol inhaler prn and doesn't have with him at work today.  Patient needs refill of prednisone today and reports he needs to schedule quarterly follow up with rheumatology also.  He doesn't feel the Chauncey Cruel is working well for him.  Has completed 4 doses and started humira prior to leaving on vacation.  Patient reported he has been taking all his daily chronic medications.  Travel covid testing negative.  No known positive close contacts.  Last known covid infection 2021 hospitalized and has received booster vaccinations.  Doesn't feel like bronchitis or sinus infection.  Leg swelling unchanged typical for him.  Denied worsening of palpitations, syncope, vertigo, passing out or falling.  Wondering if this is related  to his thoracic aneurysm.        Review of Systems  Constitutional:  Positive for fatigue. Negative for activity change, appetite change, chills, diaphoresis and fever.  HENT:  Positive for postnasal drip. Negative for congestion, ear discharge, ear pain, sinus pressure, sneezing, sore throat, trouble swallowing and voice change.   Eyes:  Negative for photophobia and visual disturbance.  Respiratory:  Positive for shortness of breath. Negative for cough, choking, wheezing and stridor.   Cardiovascular:  Positive for chest pain and leg swelling. Negative for palpitations.  Gastrointestinal:  Negative for diarrhea, nausea and vomiting.  Endocrine: Negative for cold intolerance and heat intolerance.  Genitourinary:  Negative for difficulty urinating.  Musculoskeletal:  Positive for arthralgias. Negative for gait problem, neck pain and neck stiffness.  Skin:  Negative for rash.  Allergic/Immunologic: Positive for environmental allergies and immunocompromised state. Negative for food allergies.  Neurological:  Positive for weakness. Negative for dizziness, tremors, seizures, syncope, facial asymmetry, speech difficulty, light-headedness, numbness and headaches.  Hematological:  Negative for adenopathy. Does not bruise/bleed easily.  Psychiatric/Behavioral:  Negative for agitation, confusion and sleep disturbance.       Objective:   Physical Exam Vitals and nursing note reviewed.  Constitutional:      General: He is awake. He is not in acute distress.    Appearance: He is well-developed and well-groomed. He is obese. He is ill-appearing. He is not toxic-appearing or diaphoretic.  HENT:     Head: Normocephalic  and atraumatic.     Jaw: There is normal jaw occlusion.     Salivary Glands: Right salivary gland is not diffusely enlarged. Left salivary gland is not diffusely enlarged.     Right Ear: Hearing and external ear normal.     Left Ear: Hearing and external ear normal.     Nose: Nose  normal. No congestion or rhinorrhea.     Right Turbinates: Not enlarged, swollen or pale.     Left Turbinates: Not enlarged, swollen or pale.     Right Sinus: No maxillary sinus tenderness or frontal sinus tenderness.     Left Sinus: No maxillary sinus tenderness or frontal sinus tenderness.     Mouth/Throat:     Lips: Pink. No lesions.     Mouth: Mucous membranes are moist. No oral lesions or angioedema.     Dentition: No dental abscesses or gum lesions.     Tongue: No lesions. Tongue does not deviate from midline.     Palate: No mass and lesions.     Pharynx: Uvula midline. Pharyngeal swelling and posterior oropharyngeal erythema present. No oropharyngeal exudate or uvula swelling.     Tonsils: No tonsillar exudate.     Comments: Cobblestoning posterior pharynx; bilateral allergic shiners;  Eyes:     General: Lids are normal. Vision grossly intact. Gaze aligned appropriately. Allergic shiner present. No scleral icterus.       Right eye: No discharge.        Left eye: No discharge.     Extraocular Movements: Extraocular movements intact.     Conjunctiva/sclera: Conjunctivae normal.     Pupils: Pupils are equal, round, and reactive to light.  Neck:     Thyroid: No thyromegaly.     Trachea: Trachea and phonation normal. No tracheal deviation.  Cardiovascular:     Rate and Rhythm: Normal rate and regular rhythm.     Pulses: Normal pulses.          Carotid pulses are 2+ on the right side and 2+ on the left side.    Heart sounds: Normal heart sounds, S1 normal and S2 normal.     Comments: Trace bilateral nonpitting lower extremity edema no sock lines Pulmonary:     Effort: Tachypnea present. No respiratory distress or retractions.     Breath sounds: Normal breath sounds and air entry. No stridor or transmitted upper airway sounds. No decreased breath sounds, wheezing, rhonchi or rales.     Comments: Spoke in short phrases on arrival to clinic ambulatory wearing surgical mask; after  sitting 5 minutes speaking in sentences and back to his normal baseline respiratory rate/speech pattern after 7-10 minutes; no cough/throat clearing or decrease in sp02 with exertion unlike previous episodes DOE Chest:     Chest wall: No tenderness, crepitus or edema.  Abdominal:     Palpations: Abdomen is soft.  Musculoskeletal:        General: No tenderness or signs of injury. Normal range of motion.     Right shoulder: No swelling or laceration. Normal strength.     Left shoulder: No swelling or laceration. Normal strength.     Right elbow: No swelling, deformity or lacerations. Normal range of motion.     Left elbow: No swelling, deformity or lacerations. Normal range of motion.     Right hand: No lacerations. Normal strength. Normal capillary refill.     Left hand: No lacerations. Normal strength. Normal capillary refill.     Cervical back: Normal range of  motion and neck supple. No swelling, edema, erythema, signs of trauma, lacerations, rigidity, torticollis, tenderness or crepitus.     Thoracic back: No swelling, signs of trauma or lacerations.     Lumbar back: No swelling, signs of trauma or lacerations.     Right lower leg: No tenderness. Edema present.     Left lower leg: No tenderness. Edema present.  Lymphadenopathy:     Head:     Right side of head: No submental, submandibular, tonsillar or preauricular adenopathy.     Left side of head: No submental, submandibular, tonsillar or preauricular adenopathy.     Cervical: No cervical adenopathy.     Right cervical: No superficial cervical adenopathy.    Left cervical: No superficial cervical adenopathy.  Skin:    General: Skin is warm and dry.     Capillary Refill: Capillary refill takes less than 2 seconds.     Coloration: Skin is not ashen, cyanotic, jaundiced, mottled, pale or sallow.     Findings: No abrasion, abscess, acne, bruising, burn, ecchymosis, erythema, signs of injury, laceration, lesion, petechiae, rash or wound.      Nails: There is no clubbing.  Neurological:     General: No focal deficit present.     Mental Status: He is alert and oriented to person, place, and time. Mental status is at baseline.     Cranial Nerves: No cranial nerve deficit.     Motor: No weakness.     Coordination: Coordination normal.     Gait: Gait normal.  Psychiatric:        Attention and Perception: Attention and perception normal.        Mood and Affect: Affect normal. Mood is anxious.        Speech: Speech normal.        Behavior: Behavior normal. Behavior is not agitated. Behavior is cooperative.        Thought Content: Thought content normal.        Cognition and Memory: Cognition and memory normal.        Judgment: Judgment normal.      Manual BP 220/84, p54 at 1105 sp02 RA 97% Manual BP 200/90, p46 at 1116 sp02 RA 97%  Requested RN Rolly Salter assist patient with scheduling follow up with his cardiologist Per RN Rolly Salter [phone] Call placed to Cardiology to see if they want to see pt in the office. No triage nurse available. Mesg sent to triage high priority by front desk. Awaiting return call.  Call returned by Karma Ganja, RN. Appt 9:40a tomorrow 05/24/21 first available appt. Pt did not answer call. Left VM with appt info and ER precautions in the meantime.     Assessment & Plan:  A-Dyspnea with exertion and history of chest pain in the past month  P-Patient is to not perform any strenuous duties today e.g. lifting silver trays/polishing/etc.  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  Discussed with patient on review of up to date humira and methotrexate can sometimes cause respiratory symptoms 5-6% of patients along with hypertension 5%.  Ensure he sends rheumatology message today and discuss at his appt as due quarterly appt with his provider also.  Discussed RN Rolly Salter contacting cardiology and will assist him with scheduling appt with his provider in the next 24 hours.  ER/call 911 today if new or worsening symptoms as  no EKG/stat lab or imaging capability in [facility].  After covid infection 2021 he had rehab and evaluation for DOE which had  improved.  So far has had 4 doses humira and changed immediately prior to his [place] vacation from a different regimen (Enbrel) that was not working for him by rheumatology for psoriatic arthritis.  Patient reported he had only been taking 1 pill instead of 2--20mg  for atorvastatin while on vacation.  He is only taking prednisone  daily and no extra at this time or during vacation. This spring had recurrent bronchitis and shortness of breath that was only responding to prednisone.  Patient has training/sitting all afternoon planned that he does not want to miss/reschedule.  Patient has had 2 cups half decaf/regular today.  Discussed no further caffeine this afternoon due to elevated blood pressure.  Encouraged water intake.  Leg edema trace nonpitting today much improved prior to him going on vacation.  Weight has lost 3 lbs since prior to vacation.  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  Typically only takes 2-3 minutes when he is feeling normal and generally sp02 low 90s and increases to his baseline when short of breath.  BBS CTA, scant nonpitting leg swelling today, apical HR RRR along with monitoring radial pulse.  Discussed new covid variants are circulating also and recommended home covid test today as precaution due to international travel return recently.  Community transmission moderate for home county and high for adjacent counties.  RN Rolly Salter will follow up with patient after speaking with cardiology clinic.  Discussed we had dept meeting from 12-1 today.  Exitcare handouts on chest pain and shortness of breath.  Dispensed refill prednisone  po daily #21 RF0, atorvastatin  po daily #90 RF0 from [facility] to patient today.  Holding filling  more prednisone until patient has follow up with rheumatology to see if medication/dose changes.  Patient reported he had lasix and potassium still at home.  He is to check diclofenac to see if refills needed as last dispensed prior to vacation.  These are the only medications on formulary [facility] all others he fills at [employer]. Ensure he takes his BP, potassium, lasix as prescribed today and on time.  Continue to monitor BP and sp02 at home.  If visual changes, chest pain, worsening of symptoms, worst headache of his life call 911/go to ER.  May see RN Rolly Salter this afternoon if he wants repeat check.   Patient verbalized understanding information/instructions, agreed with plan of care and had no further questions at this time.

## 2021-05-24 ENCOUNTER — Ambulatory Visit (INDEPENDENT_AMBULATORY_CARE_PROVIDER_SITE_OTHER)
Admission: RE | Admit: 2021-05-24 | Discharge: 2021-05-24 | Disposition: A | Discharge status: Home / Self Care | Payer: No Typology Code available for payment source | Source: Ambulatory Visit | Attending: Cardiology | Admitting: Cardiology

## 2021-05-24 ENCOUNTER — Encounter: Payer: Self-pay | Admitting: Cardiology

## 2021-05-24 ENCOUNTER — Ambulatory Visit (INDEPENDENT_AMBULATORY_CARE_PROVIDER_SITE_OTHER): Payer: No Typology Code available for payment source | Admitting: Cardiology

## 2021-05-24 VITALS — BP 124/70 | HR 82 | Ht 70.0 in | Wt 257.4 lb

## 2021-05-24 DIAGNOSIS — I10 Essential (primary) hypertension: Secondary | ICD-10-CM | POA: Diagnosis not present

## 2021-05-24 DIAGNOSIS — I493 Ventricular premature depolarization: Secondary | ICD-10-CM | POA: Diagnosis not present

## 2021-05-24 DIAGNOSIS — R079 Chest pain, unspecified: Secondary | ICD-10-CM | POA: Diagnosis not present

## 2021-05-24 DIAGNOSIS — I712 Thoracic aortic aneurysm, without rupture, unspecified: Secondary | ICD-10-CM

## 2021-05-24 DIAGNOSIS — R0600 Dyspnea, unspecified: Secondary | ICD-10-CM

## 2021-05-24 DIAGNOSIS — E785 Hyperlipidemia, unspecified: Secondary | ICD-10-CM

## 2021-05-24 DIAGNOSIS — R0609 Other forms of dyspnea: Secondary | ICD-10-CM

## 2021-05-24 LAB — CBC
Hematocrit: 35.4 % — ABNORMAL LOW (ref 37.5–51.0)
Hemoglobin: 11.9 g/dL — ABNORMAL LOW (ref 13.0–17.7)
MCH: 33 pg (ref 26.6–33.0)
MCHC: 33.6 g/dL (ref 31.5–35.7)
MCV: 98 fL — ABNORMAL HIGH (ref 79–97)
Platelets: 171 10*3/uL (ref 150–450)
RBC: 3.61 x10E6/uL — ABNORMAL LOW (ref 4.14–5.80)
RDW: 15.3 % (ref 11.6–15.4)
WBC: 6.5 10*3/uL (ref 3.4–10.8)

## 2021-05-24 LAB — BASIC METABOLIC PANEL
BUN/Creatinine Ratio: 15 (ref 10–24)
BUN: 18 mg/dL (ref 8–27)
CO2: 22 mmol/L (ref 20–29)
Calcium: 9.1 mg/dL (ref 8.6–10.2)
Chloride: 107 mmol/L — ABNORMAL HIGH (ref 96–106)
Creatinine, Ser: 1.17 mg/dL (ref 0.76–1.27)
Glucose: 129 mg/dL — ABNORMAL HIGH (ref 65–99)
Potassium: 3.3 mmol/L — ABNORMAL LOW (ref 3.5–5.2)
Sodium: 141 mmol/L (ref 134–144)
eGFR: 69 mL/min/{1.73_m2} (ref >59)

## 2021-05-24 IMAGING — CT CT ANGIO CHEST
2 of 8 series · 13 of 46 positions shown · IV contrast (omnipaque)
Comparison: CT angiogram chest [DATE]
COMPARISON: CT angiogram chest [DATE]
COMPARISON: CT angiogram chest [DATE]

Addendum:
CLINICAL DATA: Shortness of breath

EXAM:
CT ANGIOGRAPHY CHEST WITH CONTRAST
TECHNIQUE: Multidetector CT imaging of the chest was performed using the
standard protocol during bolus administration of intravenous
contrast. Multiplanar CT image reconstructions and MIPs were
obtained to evaluate the vascular anatomy.
CONTRAST:  80mL OMNIPAQUE IOHEXOL 350 MG/ML SOLN

[Series 5: thins · axial · 0.82mm/px · z∈[-301,-36]mm · 10 of 299 slices shown]
[im 17/299  lung]
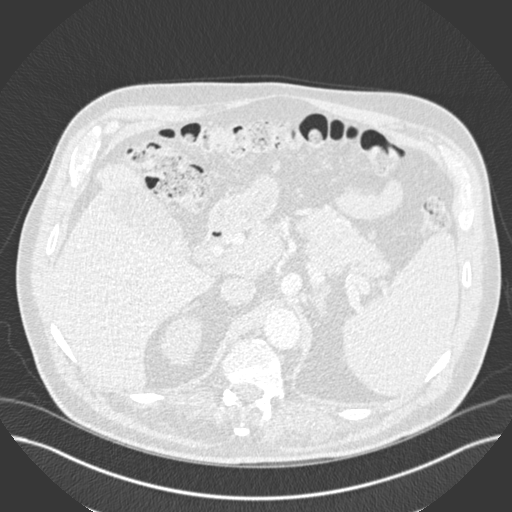
[im 50/299  soft-tissue]
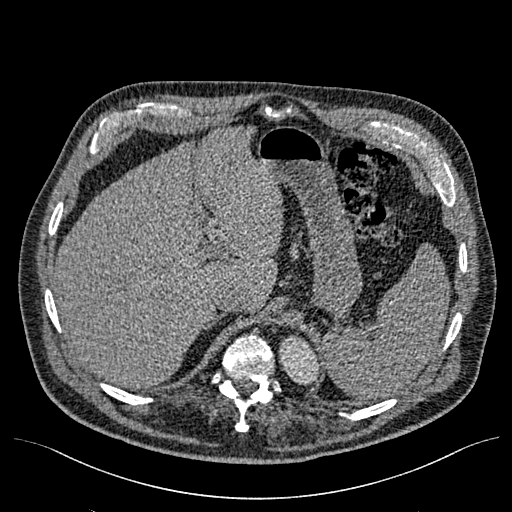
[im 83/299  lung]
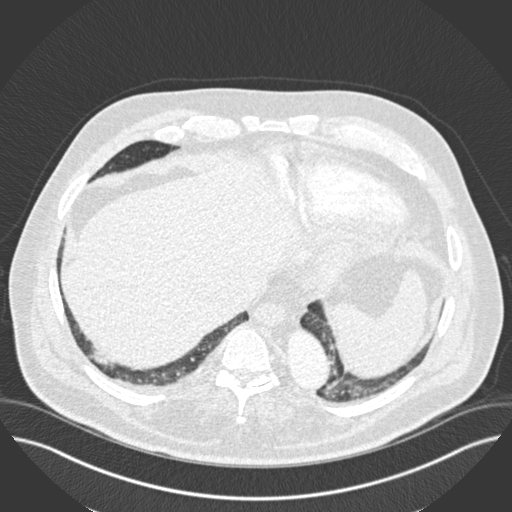
[im 100/299  soft-tissue]
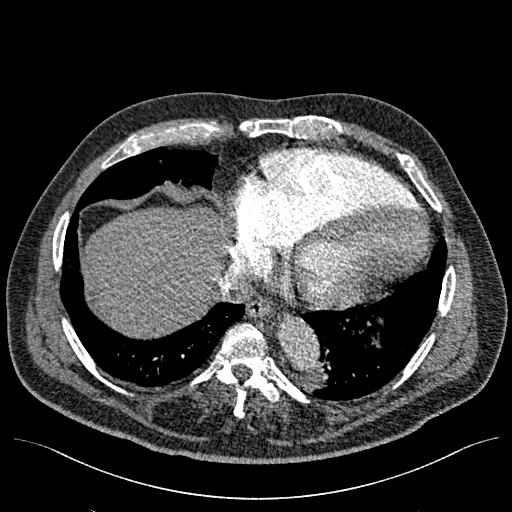
[im 133/299  lung]
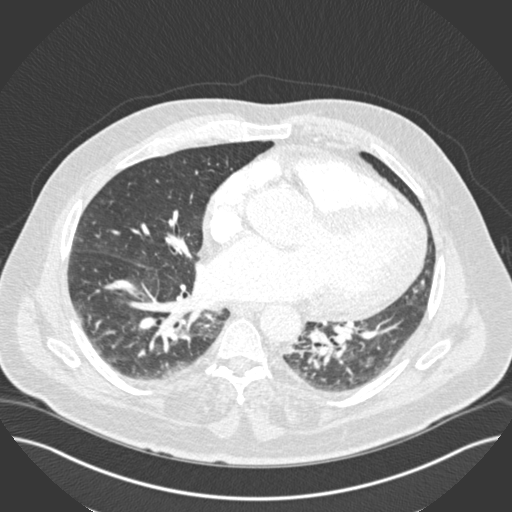
[im 166/299  soft-tissue]
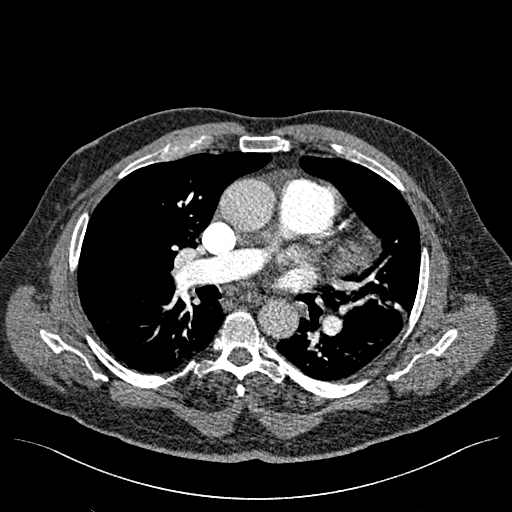
[im 199/299  lung]
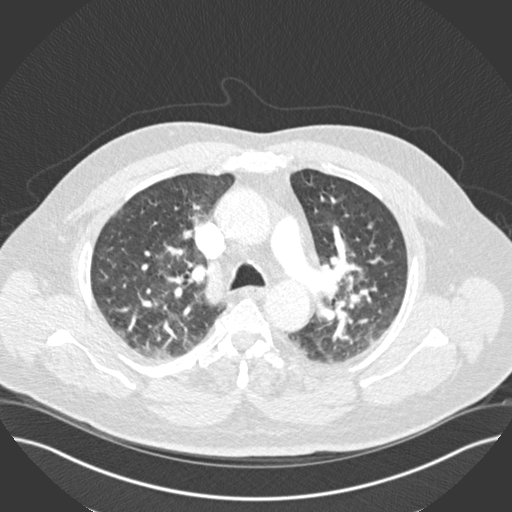
[im 216/299  soft-tissue]
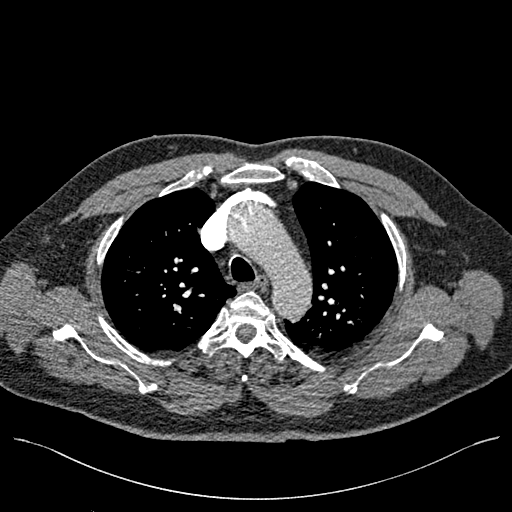
[im 249/299  lung]
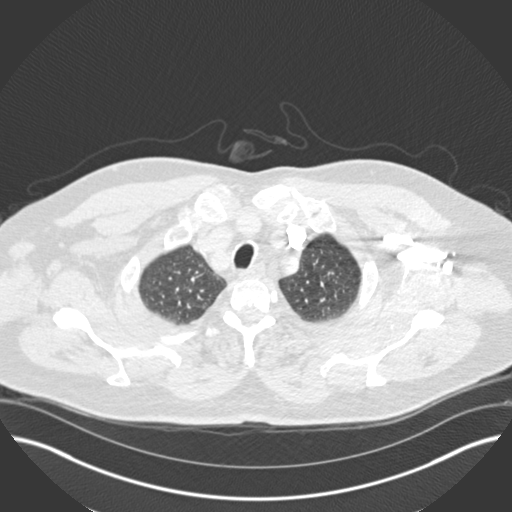
[im 282/299  soft-tissue]
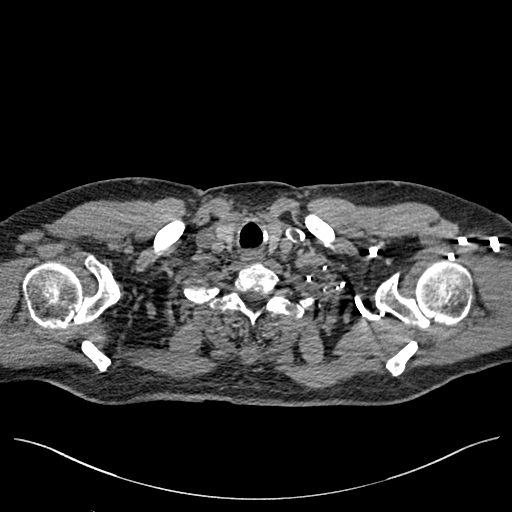

[Series 7: coronal mpr · coronal · 0.59mm/px · 3 of 132 slices shown]
[im 33/132  soft-tissue]
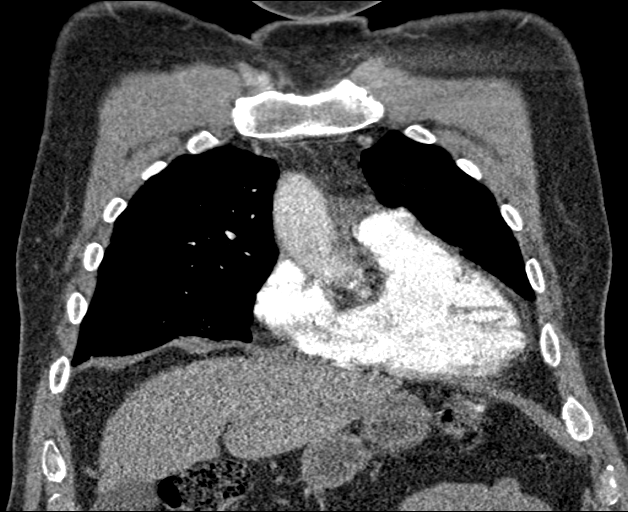
[im 66/132  soft-tissue]
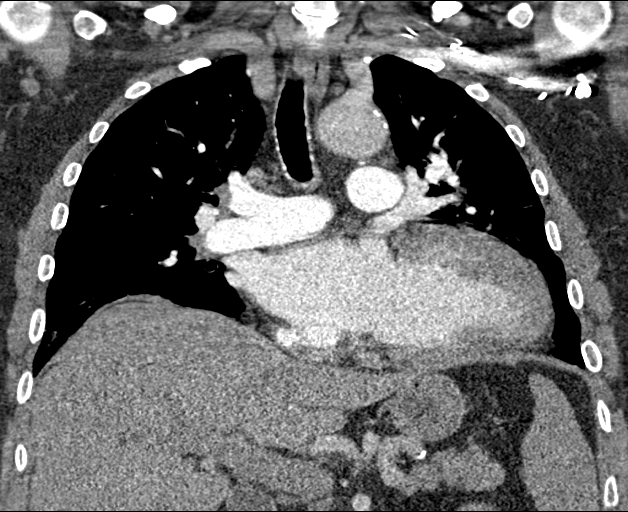
[im 99/132  soft-tissue]
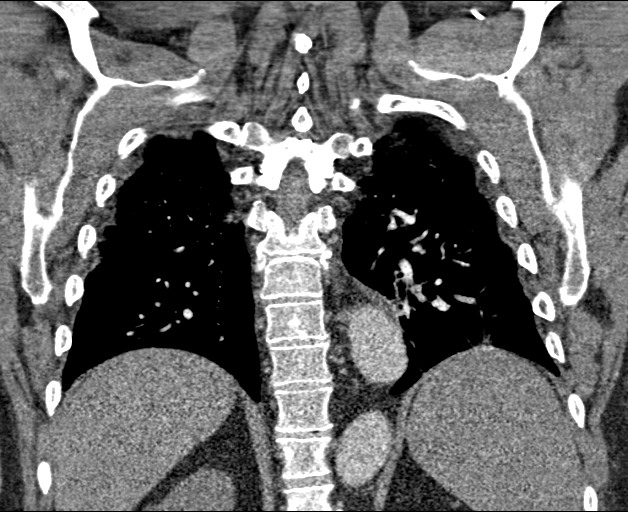

[13 of 46 positions shown; findings below may reference images not displayed]

FINDINGS: Cardiovascular: There is no evident pulmonary embolus. Ascending
thoracic aortic diameter measures 4.4 x 4.4 cm, stable. No thoracic
aortic dissection. Measured diameter at the sinuses of Valsalva
measures 4.3 cm. Measured diameter in the aortic arch measures
cm. Measured diameter of the descending aorta at the main pulmonary
outflow tract measures 3.6 x 3.5 cm. Visualized great vessels appear
unremarkable. There are occasional foci of aortic atherosclerosis.
There is no pericardial effusion or pericardial thickening. Heart is
mildly enlarged.

Mediastinum/Nodes: There is a 1 cm nodular opacity in the left lobe
of the thyroid, stable. There is a 4 mm nodular opacity in the right
lobe of the thyroid. These findings do not warrant additional
imaging surveillance per consensus guidelines. No evident thoracic
adenopathy. No esophageal lesions are evident.

Lungs/Pleura: There are scattered areas of atelectatic change in
each lower lobe and in the posterior segment of the left upper lobe.
There is no edema or consolidation. No pleural effusions. Fatty
prominence extending along the posterior descending aorta is a
stable finding, felt to be of benign etiology. Trachea and major
bronchial structures appear patent. No pneumothorax.

Upper Abdomen: Visualized upper abdominal structures appear
unremarkable.

Musculoskeletal: No blastic or lytic bone lesions. No evident chest
wall lesions.

Review of the MIP images confirms the above findings.
IMPRESSION: 1.  No demonstrable pulmonary embolus.

2. Ascending thoracic aortic diameter measures 4.4 x 4.4 cm.
Measured diameter in the proximal arch measures 3.8 cm. Recommend
annual imaging followup by CTA or MRA. This recommendation follows
[KK] ACCF/AHA/AATS/ACR/ASA/SCA/KHURSHID/KHURSHID/KHURSHID/KHURSHID Guidelines for the
Diagnosis and Management of Patients with Thoracic Aortic Disease.
Circulation.[KK]; 121: [id number]. Aortic aneurysm NOS ([KK]-[KK])
no evident thoracic aortic dissection. There are foci of aortic
atherosclerosis. There is cardiac enlargement.

3. No evident edema or consolidation. Scattered areas of atelectatic
change. No pleural effusions.

4.  No evident adenopathy.

Aortic Atherosclerosis ([KK]-[KK]).

ADDENDUM:
On further review, there is a small peripheral pulmonary embolus in
a lateral segment right lower lobe pulmonary artery branch.

ADDENDUM:
Critical Value/emergent results were called by telephone at the time
of interpretation on [DATE] at [DATE] to provider KHURSHID ,
who verbally acknowledged these results.

*** End of Addendum ***
Addendum:
FINDINGS: Cardiovascular: There is no evident pulmonary embolus. Ascending
thoracic aortic diameter measures 4.4 x 4.4 cm, stable. No thoracic
aortic dissection. Measured diameter at the sinuses of Valsalva
measures 4.3 cm. Measured diameter in the aortic arch measures
cm. Measured diameter of the descending aorta at the main pulmonary
outflow tract measures 3.6 x 3.5 cm. Visualized great vessels appear
unremarkable. There are occasional foci of aortic atherosclerosis.
There is no pericardial effusion or pericardial thickening. Heart is
mildly enlarged.

Mediastinum/Nodes: There is a 1 cm nodular opacity in the left lobe
of the thyroid, stable. There is a 4 mm nodular opacity in the right
lobe of the thyroid. These findings do not warrant additional
imaging surveillance per consensus guidelines. No evident thoracic
adenopathy. No esophageal lesions are evident.

Lungs/Pleura: There are scattered areas of atelectatic change in
each lower lobe and in the posterior segment of the left upper lobe.
There is no edema or consolidation. No pleural effusions. Fatty
prominence extending along the posterior descending aorta is a
stable finding, felt to be of benign etiology. Trachea and major
bronchial structures appear patent. No pneumothorax.

Upper Abdomen: Visualized upper abdominal structures appear
unremarkable.

Musculoskeletal: No blastic or lytic bone lesions. No evident chest
wall lesions.

Review of the MIP images confirms the above findings.
IMPRESSION: 1.  No demonstrable pulmonary embolus.

2. Ascending thoracic aortic diameter measures 4.4 x 4.4 cm.
Measured diameter in the proximal arch measures 3.8 cm. Recommend
annual imaging followup by CTA or MRA. This recommendation follows
[KK] ACCF/AHA/AATS/ACR/ASA/SCA/KHURSHID/KHURSHID/KHURSHID/KHURSHID Guidelines for the
Diagnosis and Management of Patients with Thoracic Aortic Disease.
Circulation.[KK]; 121: [id number]. Aortic aneurysm NOS ([KK]-[KK])
no evident thoracic aortic dissection. There are foci of aortic
atherosclerosis. There is cardiac enlargement.

3. No evident edema or consolidation. Scattered areas of atelectatic
change. No pleural effusions.

4.  No evident adenopathy.

Aortic Atherosclerosis ([KK]-[KK]).

ADDENDUM:
On further review, there is a small peripheral pulmonary embolus in
a lateral segment right lower lobe pulmonary artery branch.

*** End of Addendum ***
FINDINGS: Cardiovascular: There is no evident pulmonary embolus. Ascending
thoracic aortic diameter measures 4.4 x 4.4 cm, stable. No thoracic
aortic dissection. Measured diameter at the sinuses of Valsalva
measures 4.3 cm. Measured diameter in the aortic arch measures
cm. Measured diameter of the descending aorta at the main pulmonary
outflow tract measures 3.6 x 3.5 cm. Visualized great vessels appear
unremarkable. There are occasional foci of aortic atherosclerosis.
There is no pericardial effusion or pericardial thickening. Heart is
mildly enlarged.

Mediastinum/Nodes: There is a 1 cm nodular opacity in the left lobe
of the thyroid, stable. There is a 4 mm nodular opacity in the right
lobe of the thyroid. These findings do not warrant additional
imaging surveillance per consensus guidelines. No evident thoracic
adenopathy. No esophageal lesions are evident.

Lungs/Pleura: There are scattered areas of atelectatic change in
each lower lobe and in the posterior segment of the left upper lobe.
There is no edema or consolidation. No pleural effusions. Fatty
prominence extending along the posterior descending aorta is a
stable finding, felt to be of benign etiology. Trachea and major
bronchial structures appear patent. No pneumothorax.

Upper Abdomen: Visualized upper abdominal structures appear
unremarkable.

Musculoskeletal: No blastic or lytic bone lesions. No evident chest
wall lesions.

Review of the MIP images confirms the above findings.
IMPRESSION: 1.  No demonstrable pulmonary embolus.

2. Ascending thoracic aortic diameter measures 4.4 x 4.4 cm.
Measured diameter in the proximal arch measures 3.8 cm. Recommend
annual imaging followup by CTA or MRA. This recommendation follows
[KK] ACCF/AHA/AATS/ACR/ASA/SCA/KHURSHID/KHURSHID/KHURSHID/KHURSHID Guidelines for the
Diagnosis and Management of Patients with Thoracic Aortic Disease.
Circulation.[KK]; 121: [id number]. Aortic aneurysm NOS ([KK]-[KK])
no evident thoracic aortic dissection. There are foci of aortic
atherosclerosis. There is cardiac enlargement.

3. No evident edema or consolidation. Scattered areas of atelectatic
change. No pleural effusions.

4.  No evident adenopathy.

Aortic Atherosclerosis ([KK]-[KK]).

## 2021-05-24 MED ORDER — IOHEXOL 350 MG/ML SOLN
80.0000 mL | Freq: Once | INTRAVENOUS | Status: AC | PRN
  Administered 2021-05-24: 80 mL via INTRAVENOUS

## 2021-05-24 NOTE — Patient Instructions (Addendum)
Medication Instructions:  Your physician recommends that you continue on your current medications as directed. Please refer to the Current Medication list given to you today.  *If you need a refill on your cardiac medications before your next appointment, please call your pharmacy*   Lab Work: TODAY: CBC If you have labs (blood work) drawn today and your tests are completely normal, you will receive your results only by: MyChart Message (if you have MyChart) OR A paper copy in the mail If you have any lab test that is abnormal or we need to change your treatment, we will call you to review the results.   Testing/Procedures: Your provider has requested that you have chest CTA scan.   Your physician has requested that you have a cardiac catheterization. Cardiac catheterization is used to diagnose and/or treat various heart conditions. Doctors may recommend this procedure for a number of different reasons. The most common reason is to evaluate chest pain. Chest pain can be a symptom of coronary artery disease (CAD), and cardiac catheterization can show whether plaque is narrowing or blocking your heart's arteries. This procedure is also used to evaluate the valves, as well as measure the blood flow and oxygen levels in different parts of your heart. For further information please visit [email or web address] Please follow instruction sheet, as given.   Follow-Up: At [facility], you and your health needs are our priority.  As part of our continuing mission to provide you with exceptional heart care, we have created designated Provider Care Teams.  These Care Teams include your primary Cardiologist (physician) and Advanced Practice Providers (APPs -  Physician Assistants and Nurse Practitioners) who all work together to provide you with the care you need, when you need it.  Your next appointment:   6 month(s)  The format for your next appointment:   In Person  Provider:   You may see [name], MD or one of the following Advanced Practice Providers on your designated Care Team:   Ronie Spies, PA-C Jacolyn Reedy, PA-C   Other Instructions    [facility] CARDIOVASCULAR DIVISION [facility] ST OFFICE [address] [place] [address] Dept: [phone] Loc: [phone]  TRAVEON LOURO  05/24/2021  You are scheduled for a Cardiac Catheterization on Friday, June 17 with Dr. Lorine Bears.  1. Please arrive at the [facility] (Main Entrance A) at [facility]: [address] [place], [place] [address] at 7:00 AM (This time is two hours before your procedure to ensure your preparation). Free valet parking service is available.   Special note: Every effort is made to have your procedure done on time. Please understand that emergencies sometimes delay scheduled procedures.  2. Diet: Do not eat solid foods after midnight.  The patient may have clear liquids until 5am upon the day of the procedure.  3. Labs: You will need to have blood drawn today  4. Medication instructions in preparation for your procedure:   Contrast Allergy: No   Stop taking, Lasix (Furosemide)  Thursday, June 16,   On the morning of your procedure, take your Aspirin and any morning medicines NOT listed above.  You may use sips of water.  5. Plan for one night stay--bring personal belongings. 6. Bring a current list of your medications and current insurance cards. 7. You MUST have a responsible person to drive you home. 8. Someone MUST be with you the first 24 hours after you arrive home  or your discharge will be delayed. 9. Please wear clothes that are easy to get on and off and wear slip-on shoes.  Thank you for allowing [place] to care for you!   -- [facility]

## 2021-05-24 NOTE — Addendum Note (Signed)
Addended by: Theresia Majors on: 05/24/2021 10:51 AM   Modules accepted: Orders

## 2021-05-24 NOTE — Progress Notes (Signed)
Date:  05/24/2021   ID:  Ryan Glass, DOB Apr 22, 1956, MRN [id number]  PCP:  [name], MD  Cardiologist:  [name], MD Electrophysiologist:  [name], MD   Chief Complaint:  PVCs  History of Present Illness:    Ryan Glass is a 65 y.o. 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 Ladona Ridgel, dilated aortic root, hypertension, lower extremity edema and chest pain.   Event monitor done for palpitations showed frequent PVCs and 24-hour Holter showed 22% PVC load. He was referred  to EP who felt that the PVCs were originating to the lateral wall of the inferior apex of the RV.  He underwent successful PVC ablation in November 2019. He also has OSA and is on PAP.   He had some problems at one point with fatigue and bradycardia(per patient but felt to be due to nonperfusing PVCs) and stopped all his meds and was seen in the office with EKG showing NSR with bigeminal PVCs and BP was running high as well.  Recently he has been on Bystolic  daily.   InJan 2022 and he was having problems with  SOB after having COVID 19 in August.  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  He also has RA.  He was started on Flovent.  PFTs showed a mild decrease in DLCO felt related to COVID infection.   He had a pleural effusion on the left by CT in Dec 2021.   His pulmonologist wanted to see how he did on inhalers and possibly get HRCT.    He called a few days ago after getting back from a trip to [place].  While he was there he started having severe DOE to the point he could not walk at all.  He also had chest discomfort that was a pressure radiating into his left arm with diaphoresis that was exertional and off and on and is still having this intermittently with exertion.  He denies any PND or orthopnea and uses his CPAP.  His O2 sat was normal the other day but 92% on RA today.  He is at the point where he is SOB just talking.   Prior CV  studies:   The following studies were reviewed today:  Chest CTA 11/2020  Past Medical History:  Diagnosis Date   Ascending aortic aneurysm (HCC)    4.4cm on CTA 11/2020 and 4.6cm by echo 02/2021   Bilateral lower extremity edema    Chronic gout without tophus    followed by dr Sharmon Revere    (06-08-2020  per pt last episode right knee 3 wks ago)   CKD (chronic kidney disease), stage II    nephrology--- [facility] PA (10-29-2019 note in epic scanned in  media)   Hypertension    followed by cardiology, dr t. Leann Mayweather   (05-14-2018 nuclear study in epic , normal perfusion with nuclear ef 61%)   Left hydrocele    OSA on CPAP    per pt uses every night   Palpitations followed by dr t. Mayford Knife   06-08-2020  still feels palipations due to PVCs when exertion but not with chest pain/ discomfort   PVC's (premature ventricular contractions) cardiologist--- dr t. Brick Ketcher   Status post PVC ablation by Dr. Ladona Ridgel 2019 with recurrence of frequent PVCs/bigeminy;  prior pseudobradycardia r/t pvcs   Rheumatoid arthritis involving multiple sites [facility])    rheumotology--- dr a.  ziolkowska  ([facility] in [place])   Past Surgical History:  Procedure Laterality Date   HYDROCELE EXCISION Left 06/14/2020   Procedure: LEFT  HYDROCELECTOMY ADULT;  Surgeon: Noel Christmas, MD;  Location: [facility];  Service: Urology;  Laterality: Left;   INCISIONAL HERNIA REPAIR  02-23-2016   @[facility]    LAPAROSCOPIC   LAPAROSCOPIC INGUINAL HERNIA REPAIR Bilateral 08-22-2015  @[facility]    AND UMBILICAL HERNIA REPAIR   PVC ABLATION N/A 10/07/2018   Procedure: PVC ABLATION;  Surgeon: , MD;  Location: [facility] INVASIVE CV LAB;  Service: Cardiovascular;  Laterality: N/A;   UMBILICAL HERNIA REPAIR  child     Current Meds  Medication Sig   acetaminophen-codeine (TYLENOL #3) 300-30 MG tablet 1-2 tablets as needed.    albuterol (VENTOLIN HFA) 108 (90 Base) MCG/ACT inhaler Inhale 2 puffs into the lungs every 4 (four)  hours as needed for wheezing or shortness of breath.   atorvastatin (LIPITOR) 40 MG tablet Take 1 tablet (40 mg total) by mouth daily.   chlorthalidone (HYGROTON) 25 MG tablet Take 25 mg by mouth daily.   colchicine 0.6 MG tablet Take 1-2 tablets by mouth daily as needed (gout).    diclofenac (VOLTAREN) 75 MG EC tablet Take 75 mg by mouth 2 (two) times daily.   diltiazem (CARDIZEM CD) 180 MG 24 hr capsule Take 1 capsule (180 mg total) by mouth daily.   febuxostat (ULORIC) 40 MG tablet Take 1 tablet by mouth daily.   fluticasone (FLONASE) 50 MCG/ACT nasal spray SPRAY ONE SPRAY IN EACH NOSTRIL TWICE DAILY   Fluticasone-Salmeterol (ADVAIR) 250-50 MCG/DOSE AEPB Inhale 1 puff into the lungs every 12 (twelve) hours.   folic acid (FOLVITE) 1 MG tablet Take 1 tablet by mouth daily.   furosemide (LASIX) 40 MG tablet Take 1 tablet (40 mg total) by mouth 2 (two) times daily.   gabapentin (NEURONTIN) 600 MG tablet Take 600 mg by mouth 3 (three) times daily.    HUMIRA PEN 40 MG/0.4ML PNKT SMARTSIG:40 Milligram(s) SUB-Q Every 2 Weeks   hydrALAZINE (APRESOLINE) 100 MG tablet TAKE ONE TABLET BY MOUTH THREE TIMES A DAY   KLOR-CON M20 20 MEQ tablet TAKE TWO TABLETS BY MOUTH TWICE A DAY   loratadine (CLARITIN) 10 MG tablet Take 1 tablet (10 mg total) by mouth daily.   losartan (COZAAR) 100 MG tablet Take 1 tablet (100 mg total) by mouth daily. Please keep upcoming appt in January 2022 with Dr. Marinus Maw before anymore refills. Thank you   methotrexate 2.5 MG tablet Take 15 mg by mouth once a week.   montelukast (SINGULAIR) 10 MG tablet TAKE ONE TABLET BY MOUTH EVERY NIGHT AT BEDTIME   nebivolol (BYSTOLIC) 5 MG tablet TAKE TWO TABLETS BY MOUTH EVERY NIGHT AT BEDTIME   Potassium Chloride ER 20 MEQ TBCR Take 1 tablet at lunchtime by mouth in addition to 2 tabs am/pm   predniSONE (DELTASONE) 5 MG tablet Take 10 mg by mouth daily.    sildenafil (REVATIO) 20 MG tablet Take 1 tablet by mouth daily as needed.      Allergies:   Amlodipine besylate and Aspirin   Social History   Tobacco Use   Smoking status: Never   Smokeless tobacco: Never  Vaping Use   Vaping Use: Never used  Substance Use Topics   Alcohol use: Yes    Alcohol/week: 5.0 - 7.0 standard drinks    Types: 5 - 7 Cans of beer per week   Drug use: Never  Family Hx: The patient's family history includes Cardiomyopathy in his mother; Heart attack in his father.  ROS:   Please see the history of present illness.     All other systems reviewed and are negative.   Labs/Other Tests and Data Reviewed:    Recent Labs: 12/01/2020: Magnesium 1.9 02/06/2021: NT-Pro BNP 47 02/20/2021: ALT 37; Hemoglobin 14.4; Platelets 186; TSH 0.816 03/30/2021: BUN 24; Creatinine, Ser 1.28; Potassium 4.0; Sodium 140   Recent Lipid Panel Lab Results  Component Value Date/Time   CHOL 136 02/20/2021 12:15 PM   TRIG 55 02/20/2021 12:15 PM   HDL 64 02/20/2021 12:15 PM   CHOLHDL 2.1 02/20/2021 12:15 PM   LDLCALC 60 02/20/2021 12:15 PM    Wt Readings from Last 3 Encounters:  05/24/21 257 lb 6.4 oz (116.8 kg)  05/23/21 250 lb (113.4 kg)  04/13/21 252 lb 8 oz (114.5 kg)     Objective:    Vital Signs:  BP 124/70   Pulse 82   Ht 5\' 10"  (1.778 m)   Wt 257 lb 6.4 oz (116.8 kg)   SpO2 92%   BMI 36.93 kg/m    GEN: Well nourished, well developed in no acute distress HEENT: Normal NECK: No JVD; No carotid bruits LYMPHATICS: No lymphadenopathy CARDIAC:RRR, no murmurs, rubs, gallops RESPIRATORY:  Clear to auscultation without rales, wheezing or rhonchi  ABDOMEN: Soft, non-tender, non-distended MUSCULOSKELETAL:  No edema; No deformity  SKIN: Warm and dry NEUROLOGIC:  Alert and oriented x 3 PSYCHIATRIC:  Normal affect   EKG was performed in the office today and demonstrated NSR with PVCs and  RBBB  ASSESSMENT & PLAN:    1.  PVCs -s/p PVC ablation  -PVCs reoccurred and are from different focus than pre ablation -Holter monitor showed  PVC load at 8% -EP recommended staying on BB -recently having more palpitations and PVCs noted at PCP office  -repeat a 3 day ziopatch showed 10% PVC load in March 2022 and Amlodipine was changed to Cardizem  -seen back by Dr. Ladona Ridgel in April and recommended continue medical therapy -Continue prescription drug management with Cardizem CD 180mg  daily and Bystolic 10mg  daily   2.  HTN -BP well controlled on exam today -Continue prescription drug management with Bystolic 10mg  daily, Hydralazine 100mg  TID, Chlorthalidone 25mg  daily, Cardizem CD 180mg  daily and Losartan 100mg  daily  -I have personally reviewed and interpreted outside labs performed by patient's PCP which showed SCr 1.28 and K+ 4 in April 2022   3.  Ascending aortic aneurysm/Aortic atherosclerosis -4.4cm by Chest CT  11/2020 -4.6cm by echo in aortic root 02/2021 -repeat chest CTA to assess for stability 11/2021 -BP adequately controlled  4.  HLD -LDL goal < 70 due to vascular disease -I have personally reviewed and interpreted outside labs performed by patient's PCP which showed LDL 60, HDL 64 and ALT 37 in March 2022  -Continue prescription drug management with Atorvastatin 40mg  daily   5.  DOE -? related to COVID 19 infection with ILD  -followed by Pulmonary  -pulmonary considered getting HRCT if sx persist -repeat 2D echo 02/2021 showed normal LVF with EF 60-65% with moderate LVH, normal RV,  -Lexiscan myoview 02/2021 showed no ischemia -he is still having a lot of SOB.  The SOB got worse last week on a trip to [place] and had chest pain with radiation into his left arm and this resolved when he rested.  He felt the CP again this am when walking in the parking lot>>he just  got back from [place] on Saturday -O2 sats low at 92% today -he has not been able to walk much at all due to severe DOE -I will get a stat Chest CTA today to make sure he has not had any PE given the severity of his SOB, decreased O2 sats and recent plane  flight -If not PE then I think we need to do a right and left heart catheterization -Shared Decision Making/Informed Consent The risks [stroke (1 in 1000), death (1 in 1000), kidney failure [usually temporary] (1 in 500), bleeding (1 in 200), allergic reaction [possibly serious] (1 in 200)], benefits (diagnostic support and management of coronary artery disease) and alternatives of a cardiac catheterization were discussed in detail with Mr. Rinkenberger and he is willing to proceed.   Medication Adjustments/Labs and Tests Ordered: Current medicines are reviewed at length with the patient today.  Concerns regarding medicines are outlined above.  Tests Ordered: Orders Placed This Encounter  Procedures   EKG 12-Lead    Medication Changes: No orders of the defined types were placed in this encounter.   Disposition:  followup with me in 1 year  Signed, [name], MD  05/24/2021 10:31 AM    [facility]

## 2021-05-25 ENCOUNTER — Other Ambulatory Visit: Payer: Self-pay

## 2021-05-25 ENCOUNTER — Encounter (HOSPITAL_COMMUNITY): Payer: Self-pay | Admitting: Emergency Medicine

## 2021-05-25 ENCOUNTER — Telehealth: Payer: Self-pay

## 2021-05-25 ENCOUNTER — Inpatient Hospital Stay (HOSPITAL_COMMUNITY)
Admission: EM | Admit: 2021-05-25 | Discharge: 2021-05-30 | DRG: 175 | Disposition: A | Discharge status: Home / Self Care | Payer: No Typology Code available for payment source | Attending: Family Medicine | Admitting: Family Medicine

## 2021-05-25 DIAGNOSIS — I5033 Acute on chronic diastolic (congestive) heart failure: Secondary | ICD-10-CM | POA: Diagnosis present

## 2021-05-25 DIAGNOSIS — R072 Precordial pain: Secondary | ICD-10-CM

## 2021-05-25 DIAGNOSIS — R778 Other specified abnormalities of plasma proteins: Secondary | ICD-10-CM | POA: Diagnosis present

## 2021-05-25 DIAGNOSIS — I10 Essential (primary) hypertension: Secondary | ICD-10-CM | POA: Diagnosis present

## 2021-05-25 DIAGNOSIS — I700 Atherosclerosis of aorta: Secondary | ICD-10-CM | POA: Diagnosis present

## 2021-05-25 DIAGNOSIS — Z888 Allergy status to other drugs, medicaments and biological substances status: Secondary | ICD-10-CM

## 2021-05-25 DIAGNOSIS — I2510 Atherosclerotic heart disease of native coronary artery without angina pectoris: Secondary | ICD-10-CM | POA: Diagnosis present

## 2021-05-25 DIAGNOSIS — D849 Immunodeficiency, unspecified: Secondary | ICD-10-CM | POA: Diagnosis present

## 2021-05-25 DIAGNOSIS — Z6835 Body mass index (BMI) 35.0-35.9, adult: Secondary | ICD-10-CM

## 2021-05-25 DIAGNOSIS — I2699 Other pulmonary embolism without acute cor pulmonale: Secondary | ICD-10-CM | POA: Diagnosis present

## 2021-05-25 DIAGNOSIS — Z20822 Contact with and (suspected) exposure to covid-19: Secondary | ICD-10-CM | POA: Diagnosis present

## 2021-05-25 DIAGNOSIS — I2693 Single subsegmental pulmonary embolism without acute cor pulmonale: Principal | ICD-10-CM | POA: Diagnosis present

## 2021-05-25 DIAGNOSIS — R0600 Dyspnea, unspecified: Secondary | ICD-10-CM

## 2021-05-25 DIAGNOSIS — I712 Thoracic aortic aneurysm, without rupture: Secondary | ICD-10-CM | POA: Diagnosis present

## 2021-05-25 DIAGNOSIS — J9811 Atelectasis: Secondary | ICD-10-CM | POA: Diagnosis present

## 2021-05-25 DIAGNOSIS — Z7952 Long term (current) use of systemic steroids: Secondary | ICD-10-CM

## 2021-05-25 DIAGNOSIS — E871 Hypo-osmolality and hyponatremia: Secondary | ICD-10-CM | POA: Diagnosis present

## 2021-05-25 DIAGNOSIS — I472 Ventricular tachycardia: Secondary | ICD-10-CM | POA: Diagnosis present

## 2021-05-25 DIAGNOSIS — M0689 Other specified rheumatoid arthritis, multiple sites: Secondary | ICD-10-CM | POA: Diagnosis present

## 2021-05-25 DIAGNOSIS — I2721 Secondary pulmonary arterial hypertension: Secondary | ICD-10-CM | POA: Diagnosis present

## 2021-05-25 DIAGNOSIS — I248 Other forms of acute ischemic heart disease: Secondary | ICD-10-CM | POA: Diagnosis present

## 2021-05-25 DIAGNOSIS — R0781 Pleurodynia: Secondary | ICD-10-CM

## 2021-05-25 DIAGNOSIS — I77810 Thoracic aortic ectasia: Secondary | ICD-10-CM | POA: Diagnosis not present

## 2021-05-25 DIAGNOSIS — N1831 Chronic kidney disease, stage 3a: Secondary | ICD-10-CM | POA: Diagnosis present

## 2021-05-25 DIAGNOSIS — I493 Ventricular premature depolarization: Secondary | ICD-10-CM | POA: Diagnosis present

## 2021-05-25 DIAGNOSIS — E876 Hypokalemia: Secondary | ICD-10-CM | POA: Diagnosis present

## 2021-05-25 DIAGNOSIS — Z8249 Family history of ischemic heart disease and other diseases of the circulatory system: Secondary | ICD-10-CM

## 2021-05-25 DIAGNOSIS — G4730 Sleep apnea, unspecified: Secondary | ICD-10-CM | POA: Diagnosis present

## 2021-05-25 DIAGNOSIS — I110 Hypertensive heart disease with heart failure: Secondary | ICD-10-CM | POA: Diagnosis present

## 2021-05-25 DIAGNOSIS — R0602 Shortness of breath: Secondary | ICD-10-CM | POA: Diagnosis not present

## 2021-05-25 DIAGNOSIS — I2729 Other secondary pulmonary hypertension: Secondary | ICD-10-CM | POA: Diagnosis not present

## 2021-05-25 DIAGNOSIS — E662 Morbid (severe) obesity with alveolar hypoventilation: Secondary | ICD-10-CM | POA: Diagnosis present

## 2021-05-25 DIAGNOSIS — E7849 Other hyperlipidemia: Secondary | ICD-10-CM | POA: Diagnosis present

## 2021-05-25 DIAGNOSIS — R0982 Postnasal drip: Secondary | ICD-10-CM | POA: Diagnosis present

## 2021-05-25 DIAGNOSIS — Z79899 Other long term (current) drug therapy: Secondary | ICD-10-CM

## 2021-05-25 DIAGNOSIS — R6889 Other general symptoms and signs: Secondary | ICD-10-CM | POA: Diagnosis not present

## 2021-05-25 DIAGNOSIS — I519 Heart disease, unspecified: Secondary | ICD-10-CM | POA: Diagnosis present

## 2021-05-25 DIAGNOSIS — Z8616 Personal history of COVID-19: Secondary | ICD-10-CM

## 2021-05-25 DIAGNOSIS — I130 Hypertensive heart and chronic kidney disease with heart failure and stage 1 through stage 4 chronic kidney disease, or unspecified chronic kidney disease: Secondary | ICD-10-CM | POA: Diagnosis present

## 2021-05-25 DIAGNOSIS — I2609 Other pulmonary embolism with acute cor pulmonale: Secondary | ICD-10-CM | POA: Diagnosis not present

## 2021-05-25 DIAGNOSIS — R0789 Other chest pain: Secondary | ICD-10-CM | POA: Diagnosis not present

## 2021-05-25 DIAGNOSIS — Z886 Allergy status to analgesic agent status: Secondary | ICD-10-CM | POA: Diagnosis not present

## 2021-05-25 DIAGNOSIS — J3089 Other allergic rhinitis: Secondary | ICD-10-CM | POA: Diagnosis present

## 2021-05-25 DIAGNOSIS — I152 Hypertension secondary to endocrine disorders: Secondary | ICD-10-CM | POA: Diagnosis not present

## 2021-05-25 DIAGNOSIS — R0609 Other forms of dyspnea: Secondary | ICD-10-CM | POA: Diagnosis present

## 2021-05-25 DIAGNOSIS — I4510 Unspecified right bundle-branch block: Secondary | ICD-10-CM | POA: Diagnosis present

## 2021-05-25 DIAGNOSIS — I509 Heart failure, unspecified: Secondary | ICD-10-CM | POA: Diagnosis not present

## 2021-05-25 DIAGNOSIS — M1A0720 Idiopathic chronic gout, left ankle and foot, without tophus (tophi): Secondary | ICD-10-CM | POA: Diagnosis present

## 2021-05-25 DIAGNOSIS — N179 Acute kidney failure, unspecified: Secondary | ICD-10-CM | POA: Diagnosis present

## 2021-05-25 DIAGNOSIS — E1159 Type 2 diabetes mellitus with other circulatory complications: Secondary | ICD-10-CM | POA: Diagnosis not present

## 2021-05-25 DIAGNOSIS — I5022 Chronic systolic (congestive) heart failure: Secondary | ICD-10-CM

## 2021-05-25 DIAGNOSIS — G4733 Obstructive sleep apnea (adult) (pediatric): Secondary | ICD-10-CM | POA: Diagnosis not present

## 2021-05-25 HISTORY — DX: COVID-19: U07.1

## 2021-05-25 HISTORY — DX: Pneumonia due to coronavirus disease 2019: J12.82

## 2021-05-25 LAB — CBC WITH DIFFERENTIAL/PLATELET
Abs Immature Granulocytes: 0.03 10*3/uL (ref 0.00–0.07)
Basophils Absolute: 0 10*3/uL (ref 0.0–0.1)
Basophils Relative: 1 %
Eosinophils Absolute: 0.2 10*3/uL (ref 0.0–0.5)
Eosinophils Relative: 3 %
HCT: 36.4 % — ABNORMAL LOW (ref 39.0–52.0)
Hemoglobin: 12.3 g/dL — ABNORMAL LOW (ref 13.0–17.0)
Immature Granulocytes: 1 %
Lymphocytes Relative: 16 %
Lymphs Abs: 0.9 10*3/uL (ref 0.7–4.0)
MCH: 33.5 pg (ref 26.0–34.0)
MCHC: 33.8 g/dL (ref 30.0–36.0)
MCV: 99.2 fL (ref 80.0–100.0)
Monocytes Absolute: 0.7 10*3/uL (ref 0.1–1.0)
Monocytes Relative: 12 %
Neutro Abs: 3.7 10*3/uL (ref 1.7–7.7)
Neutrophils Relative %: 67 %
Platelets: 193 10*3/uL (ref 150–400)
RBC: 3.67 MIL/uL — ABNORMAL LOW (ref 4.22–5.81)
RDW: 15.1 % (ref 11.5–15.5)
WBC: 5.5 10*3/uL (ref 4.0–10.5)
nRBC: 0 % (ref 0.0–0.2)

## 2021-05-25 LAB — HEPARIN LEVEL (UNFRACTIONATED): Heparin Unfractionated: 0.15 IU/mL — ABNORMAL LOW (ref 0.30–0.70)

## 2021-05-25 LAB — BASIC METABOLIC PANEL
Anion gap: 10 (ref 5–15)
BUN: 12 mg/dL (ref 8–23)
CO2: 22 mmol/L (ref 22–32)
Calcium: 8.7 mg/dL — ABNORMAL LOW (ref 8.9–10.3)
Chloride: 101 mmol/L (ref 98–111)
Creatinine, Ser: 1.21 mg/dL (ref 0.61–1.24)
GFR, Estimated: >60 mL/min (ref >60)
Glucose, Bld: 96 mg/dL (ref 70–99)
Potassium: 3 mmol/L — ABNORMAL LOW (ref 3.5–5.1)
Sodium: 133 mmol/L — ABNORMAL LOW (ref 135–145)

## 2021-05-25 LAB — SARS CORONAVIRUS 2 (TAT 6-24 HRS): SARS Coronavirus 2: NEGATIVE

## 2021-05-25 LAB — HIV ANTIBODY (ROUTINE TESTING W REFLEX): HIV Screen 4th Generation wRfx: NONREACTIVE

## 2021-05-25 LAB — TROPONIN I (HIGH SENSITIVITY)
Troponin I (High Sensitivity): 47 ng/L — ABNORMAL HIGH (ref <18)
Troponin I (High Sensitivity): 55 ng/L — ABNORMAL HIGH (ref <18)
Troponin I (High Sensitivity): 51 ng/L — ABNORMAL HIGH (ref <18)
Troponin I (High Sensitivity): 53 ng/L — ABNORMAL HIGH (ref <18)
Troponin I (High Sensitivity): 55 ng/L — ABNORMAL HIGH (ref <18)

## 2021-05-25 LAB — PROTIME-INR
INR: 1.1 (ref 0.8–1.2)
Prothrombin Time: 13.8 seconds (ref 11.4–15.2)

## 2021-05-25 LAB — APTT: aPTT: 31 seconds (ref 24–36)

## 2021-05-25 MED ORDER — FLUTICASONE FUROATE-VILANTEROL 200-25 MCG/INH IN AEPB
1.0000 | INHALATION_SPRAY | Freq: Every day | RESPIRATORY_TRACT | Status: DC
  Administered 2021-05-26 – 2021-05-30 (×5): 1 via RESPIRATORY_TRACT
  Filled 2021-05-25: qty 28

## 2021-05-25 MED ORDER — HEPARIN (PORCINE) 25000 UT/250ML-% IV SOLN
2250.0000 [IU]/h | INTRAVENOUS | Status: DC
  Administered 2021-05-25: 1950 [IU]/h via INTRAVENOUS
  Administered 2021-05-25: 1700 [IU]/h via INTRAVENOUS
  Administered 2021-05-26 (×2): 2000 [IU]/h via INTRAVENOUS
  Administered 2021-05-29: 2250 [IU]/h via INTRAVENOUS
  Filled 2021-05-25 (×9): qty 250

## 2021-05-25 MED ORDER — HYDRALAZINE HCL 50 MG PO TABS
100.0000 mg | ORAL_TABLET | Freq: Three times a day (TID) | ORAL | Status: DC
  Administered 2021-05-25 – 2021-05-30 (×15): 100 mg via ORAL
  Filled 2021-05-25 (×18): qty 2

## 2021-05-25 MED ORDER — CHLORTHALIDONE 25 MG PO TABS
25.0000 mg | ORAL_TABLET | Freq: Every day | ORAL | Status: DC
  Administered 2021-05-25: 25 mg via ORAL
  Filled 2021-05-25: qty 1

## 2021-05-25 MED ORDER — HEPARIN BOLUS VIA INFUSION
6000.0000 [IU] | Freq: Once | INTRAVENOUS | Status: AC
  Administered 2021-05-25: 6000 [IU] via INTRAVENOUS
  Filled 2021-05-25: qty 6000

## 2021-05-25 MED ORDER — FOLIC ACID 1 MG PO TABS
1.0000 mg | ORAL_TABLET | Freq: Every day | ORAL | Status: DC
  Administered 2021-05-26 – 2021-05-30 (×5): 1 mg via ORAL
  Filled 2021-05-25 (×5): qty 1

## 2021-05-25 MED ORDER — DILTIAZEM HCL ER COATED BEADS 180 MG PO CP24
180.0000 mg | ORAL_CAPSULE | Freq: Every day | ORAL | Status: DC
  Administered 2021-05-26 – 2021-05-29 (×4): 180 mg via ORAL
  Filled 2021-05-25 (×4): qty 1

## 2021-05-25 MED ORDER — NEBIVOLOL HCL 10 MG PO TABS
5.0000 mg | ORAL_TABLET | Freq: Every day | ORAL | Status: DC
  Administered 2021-05-25: 5 mg via ORAL
  Filled 2021-05-25: qty 1

## 2021-05-25 MED ORDER — PREDNISONE 10 MG PO TABS
10.0000 mg | ORAL_TABLET | Freq: Every day | ORAL | Status: DC
  Administered 2021-05-25 – 2021-05-30 (×6): 10 mg via ORAL
  Filled 2021-05-25 (×6): qty 1

## 2021-05-25 MED ORDER — FLUTICASONE PROPIONATE 50 MCG/ACT NA SUSP
1.0000 | Freq: Two times a day (BID) | NASAL | Status: DC
  Administered 2021-05-25 – 2021-05-30 (×9): 1 via NASAL
  Filled 2021-05-25 (×2): qty 16

## 2021-05-25 MED ORDER — ATORVASTATIN CALCIUM 40 MG PO TABS
40.0000 mg | ORAL_TABLET | Freq: Every day | ORAL | Status: DC
  Administered 2021-05-25 – 2021-05-30 (×6): 40 mg via ORAL
  Filled 2021-05-25 (×6): qty 1

## 2021-05-25 MED ORDER — POTASSIUM CHLORIDE CRYS ER 20 MEQ PO TBCR
40.0000 meq | EXTENDED_RELEASE_TABLET | Freq: Two times a day (BID) | ORAL | Status: DC
  Administered 2021-05-25 – 2021-05-27 (×4): 40 meq via ORAL
  Filled 2021-05-25 (×4): qty 2

## 2021-05-25 MED ORDER — HEPARIN BOLUS VIA INFUSION
2900.0000 [IU] | Freq: Once | INTRAVENOUS | Status: AC
  Administered 2021-05-25: 2900 [IU] via INTRAVENOUS
  Filled 2021-05-25: qty 2900

## 2021-05-25 MED ORDER — ACETAMINOPHEN 325 MG PO TABS
650.0000 mg | ORAL_TABLET | Freq: Four times a day (QID) | ORAL | Status: DC | PRN
  Administered 2021-05-26 – 2021-05-27 (×3): 650 mg via ORAL
  Filled 2021-05-25 (×3): qty 2

## 2021-05-25 MED ORDER — LOSARTAN POTASSIUM 50 MG PO TABS
100.0000 mg | ORAL_TABLET | Freq: Every day | ORAL | Status: DC
  Administered 2021-05-25 – 2021-05-30 (×6): 100 mg via ORAL
  Filled 2021-05-25 (×7): qty 2

## 2021-05-25 MED ORDER — ALBUTEROL SULFATE (2.5 MG/3ML) 0.083% IN NEBU
2.5000 mg | INHALATION_SOLUTION | RESPIRATORY_TRACT | Status: DC | PRN
  Filled 2021-05-25: qty 3

## 2021-05-25 MED ORDER — CHLORTHALIDONE 25 MG PO TABS: 25.0000 mg | ORAL_TABLET | Freq: Every day | ORAL | Status: DC

## 2021-05-25 MED ORDER — DILTIAZEM HCL ER COATED BEADS 180 MG PO CP24
180.0000 mg | ORAL_CAPSULE | Freq: Every day | ORAL | Status: DC
  Administered 2021-05-25: 180 mg via ORAL
  Filled 2021-05-25: qty 1

## 2021-05-25 MED ORDER — FUROSEMIDE 40 MG PO TABS
40.0000 mg | ORAL_TABLET | Freq: Two times a day (BID) | ORAL | Status: DC
  Administered 2021-05-26 – 2021-05-27 (×2): 40 mg via ORAL
  Filled 2021-05-25 (×4): qty 1

## 2021-05-25 MED ORDER — GABAPENTIN 600 MG PO TABS
600.0000 mg | ORAL_TABLET | Freq: Three times a day (TID) | ORAL | Status: DC
  Administered 2021-05-25 – 2021-05-28 (×9): 600 mg via ORAL
  Filled 2021-05-25 (×10): qty 1

## 2021-05-25 MED ORDER — NEBIVOLOL HCL 10 MG PO TABS
5.0000 mg | ORAL_TABLET | Freq: Every day | ORAL | Status: DC
  Filled 2021-05-25: qty 1

## 2021-05-25 NOTE — Telephone Encounter (Signed)
Received call from Dr. Margarita Grizzle who is requesting to speak to Dr. Mayford Knife in regards to patient's CT scan. He requested a call back at [phone] as soon as possible.

## 2021-05-25 NOTE — Progress Notes (Signed)
ANTICOAGULATION CONSULT NOTE - Initial Consult  Pharmacy Consult for Heparin Indication: pulmonary embolus  Allergies  Allergen Reactions   Amlodipine Besylate Swelling    Leg swelling with 10mg  daily am dosing; decreased with BID 5mg  dosing   Aspirin Itching    Patient Measurements:   Heparin Dosing Weight: 98.9kg  Vital Signs: Temp: 100 F (37.8 C) (06/16 1019) Temp Source: Oral (06/16 1018) BP: 149/84 (06/16 1019) Pulse Rate: 49 (06/16 1019)  Labs: Recent Labs    05/24/21 1038  HGB 11.9*  HCT 35.4*  PLT 171  CREATININE 1.17    Estimated Creatinine Clearance: 80.6 mL/min (by C-G formula based on SCr of 1.17 mg/dL).   Medical History: Past Medical History:  Diagnosis Date   Ascending aortic aneurysm (HCC)    4.4cm on CTA 11/2020 and 4.6cm by echo 02/2021   Bilateral lower extremity edema    Chronic gout without tophus    followed by dr 12/2020    (06-08-2020  per pt last episode right knee 3 wks ago)   CKD (chronic kidney disease), stage II    nephrology--- Sharmon Revere PA (10-29-2019 note in epic scanned in  media)   Hypertension    followed by cardiology, dr t. turner   (05-14-2018 nuclear study in epic , normal perfusion with nuclear ef 61%)   Left hydrocele    OSA on CPAP    per pt uses every night   Palpitations followed by dr t. 10-31-2019   06-08-2020  still feels palipations due to PVCs when exertion but not with chest pain/ discomfort   PVC's (premature ventricular contractions) cardiologist--- dr t. turner   Status post PVC ablation by Dr. Mayford Knife 2019 with recurrence of frequent PVCs/bigeminy;  prior pseudobradycardia r/t pvcs   Rheumatoid arthritis involving multiple sites [facility])    rheumotology--- dr a. 2020  ([facility] in [place])    Medications:  Scheduled:   heparin  6,000 Units Intravenous Once    Assessment: Patient with PE, worsening shortness of breath over one week. Pharmacy consulted for heparin.   Peripheral pulmonary embolus on  CT. Hgb 11.9, PLT 171 at baseline  Goal of Therapy:  Heparin level 0.3-0.7 units/ml Monitor platelets by anticoagulation protocol: Yes   Plan:  Give 6000 units bolus x 1 Start heparin infusion at 1700 units/hr Check anti-Xa level in 6-8 hours and daily while on heparin Continue to monitor H&H and platelets  [facility], PharmD PGY1 Pharmacy Resident 05/25/2021 10:32 AM

## 2021-05-25 NOTE — ED Triage Notes (Signed)
Pt states he has been having cp, sob for about a week. Pt went to his MD yesterday for this. Pts MD called him today and informed him that he has a PE.

## 2021-05-25 NOTE — Progress Notes (Signed)
Spoke with cardiology fellow Dr. Deforest Hoyles regarding concern from the day team about continuing home medications and nurse call regarding frequent PVCs with some chest pain. Given patient has a history of PVCs and his vitals are stable we should continue the beta blocker and diltiazem. His troponin is a bit elevated but trend appears to be flat at 47>55. He stated that since there is a reason for the chest pain and the troponin is currently flat then continue the home medications, though we can hold chlorthalidone at this time. Dr. Deforest Hoyles stated that the information will be passed along to day cardiologist for consult  - Continue home beta blocker and diltiazem - Monitor blood pressures and heart rate - Continue cardiac telemetery - Continue to hold chlorthalidone - Dr. Deforest Hoyles to pass along information to day cardiology team   Zoi Devine, DO

## 2021-05-25 NOTE — Progress Notes (Signed)
Pt arrived to unit from ED VSS, A/O x 4,  CCMD called ,CHG given, pt oriented to unit,Will continue to monitor. Pt on IV heparin @17ML /hr  , RN

## 2021-05-25 NOTE — Telephone Encounter (Signed)
Spoke with the patient and his wife. She will take him to the ER at [facility] now.

## 2021-05-25 NOTE — H&P (Addendum)
Family Medicine Teaching [facility] Admission History and Physical Service Pager: [phone]  Patient name: Ryan Glass Medical record number: [id number] Date of birth: 07/15/1956 Age: 65 y.o. Gender: male  Primary Care Provider: [name], MD Consultants: None Code Status: Full  Preferred Emergency Contact: Yaakov Guthrie [phone]  Chief Complaint: Dyspnea  Assessment and Plan: Ryan Glass is a 65 y.o. male presenting with . PMH is significant for PVCs s/p PVC ablation (2019), dilated aortic root, HTN, HLD, lower extremity edema,COVID 19 infection, colon polyps s/p polypectomy (2018), OSA  Dyspnea on Exertion 2/2 Acute pulmonary emboli  Chest Pain Patient has had chest pain and difficulty breathing for 2 weeks.  Found on outpatient CTA ordered by Cardiologist.  Instructed to come to ED by Cardiologist following results.  Previously had Heart Cath scheduled on June 17th with Dr. Lorine Bears,  but this was canceled in setting of PE.  Concern for possible DVT, will order Ultrasound.  Chest pain likely due to stable angina.  Possibly exacerbated by PE. Satting 93-95% on RA. Trops 47>55.  PT, PTT normal.  Patient also has history of Aortic Aneurysm which has reamined stable.  Patient also has PVC's that were ablated in 2019 but have returned in past few months.  Plan to admit patient for anticoagulation and monitor overnight with plan to reach out to tommorow for further management.   - Admit to inpatient service Attending, Dr. McDiarmid - Consult cards AM - Continue Heparin gtt - Lasix 40 mg BID (home dose) - Losartan 100 mg, hydral 100 mg  - Albuterol PRN, Breo - Continue to trend Trops - Reach out to Cardiology tomorrow regarding plan/Heart Cath while inpatient - follow up bilateral LE ultrasound - Continuous Cardiac Monitoring  Hypokalemia, Hyponatremia On presentation patient's K+ 3.0, likely in the setting of Chlorthalidone use or poor oral intake due to  ongoing chest pain.  Patient on Klor-Con 40 mg at home. Na 133, will hold chlorthalidone and monitor for improvement. If no improvement consider, low effective arterial blood volume w/ evidence of LE edema on exam.  --Restart patient home Klor-Con --BMP in am --Mag in am  CKD III sCR 1.21, at baseline -AM BMP --Avoid nephrotoxic agents --Monitor closely  PVCs s/p PVC ablation Patient seen recently by cardiology on 05/24/2021 and has PVCs reoccurred and they are from different focus than preablation.  Holter monitor showed PVC load at 8% EP recommended staying on beta-blockers.  Repeat 3-day Zio patch showed 10% PVC load in March 2022 and amlodipine was changed to Cardizem. Home meds: Cardizem 180 mg Qday, Bystolic (nebivolol) 10 mg Q day --Continue hydral; hold cardizem, bystolic, and chlorthalidone  HTN SBP 144-165, DBP 80-101. Home meds: Bystolic 10 mg Q day, Hydral 100 mg TID, Chlorthalidone 25 mg Q day, Cardizem 180 mg Q day, Losartan 100 mg Q day --hold cardizem, bystolic, chlorthalidone -Continue hydral and losartan  Ascending aortic aneurysm I Aortic atherosclerosis Chest CT on 12/21 showed ascending aortic aneurysm 4.4 cm, and 4.6 cm by echo and aortic root on 02/2021.  Repeat chest CTA on 11/2021 showed stable ascending aortic aneurysm. --Control BP  HLD LDL goal <70 vascular disease for this patient.  02/2020 shows LDL 60.  Meds: Atorvastatin 40 mg Q day --Continue home Atorvastatin  RA & Chronic gout of L foot Home meds: Colchicine 0.6 mg, Uloric 40 mg, methotrexate 15 mg once a week, prednisone 10 mg, folic acid, Tylenol, Allopurinol  Q day - Continue Home steroid 10 mg -  Hold rest of home medications  OSA on CPAP --CPAP overnight   FEN/GI: Heart healthy diet Prophylaxis: Heparin gtt  Disposition: Cardiac Telemetry  History of Present Illness:  Ryan Glass is a 65 y.o. male presenting with 2 weeks experiencing short of breath and chest pain with exertion.     Recent trip to [place] for 1 month. Returned Saturday.  Saw cardiology yesterday and got testing. Was called and told to come to ED.  Was supposed to have cath 6/17 but may be cancelled due to this hospitalization.  No pain with PVCs.  Stable aneurysm.  Has chest pain currently that radiates to left shoulder.  Has never had this pain before.  Denies nausea and vomiting.  Has cough.   Drinks beer occasionally every other weekend 2-3 beers. Denies tobacco and illicit drug use.   Review Of Systems: Per HPI with the following additions:   Review of Systems  Constitutional:  Positive for activity change. Negative for diaphoresis and fever.  Respiratory:  Positive for cough.   Cardiovascular:  Positive for chest pain.  Gastrointestinal:  Negative for diarrhea.  Genitourinary:  Negative for difficulty urinating.    Patient Active Problem List   Diagnosis Date Noted   Thoracic aortic aneurysm without rupture (HCC) 02/01/2021   Ascending aortic aneurysm (HCC) 02/01/2021   Other hyperlipidemia 10/14/2020   Testicular swelling 05/14/2019   Pain in right knee 01/29/2019   PVC's (premature ventricular contractions)    PVC (premature ventricular contraction) 10/07/2018   Dilated aortic root (HCC)    Hip pain, acute, right 10/29/2016   Allergic rhinitis 01/10/2016   Chronic gout without tophus 01/10/2016   Drug therapy 01/10/2016   OSA (obstructive sleep apnea) 01/10/2016   Colonic polyp 12/14/2015   Rt groin pain 12/14/2015   Scoliosis of thoracolumbar spine 12/14/2015   Right lower quadrant abdominal pain 04/20/2015   Arthritis of knee 02/22/2015   Edema of extremities 02/22/2015   Essential hypertension 02/22/2015   Left-sided low back pain without sciatica 02/22/2015   Preventative health care 02/22/2015   Low serum potassium level 04/27/2014   Sleep apnea 04/27/2014   Bradycardia by electrocardiogram 04/16/2014   Chest pain 04/16/2014    Past Medical History: Past Medical  History:  Diagnosis Date   Ascending aortic aneurysm (HCC)    4.4cm on CTA 11/2020 and 4.6cm by echo 02/2021   Bilateral lower extremity edema    Chronic gout without tophus    followed by dr Sharmon Revere    (06-08-2020  per pt last episode right knee 3 wks ago)   CKD (chronic kidney disease), stage II    nephrology--- Virgina Norfolk PA (10-29-2019 note in epic scanned in  media)   Hypertension    followed by cardiology, dr t. turner   (05-14-2018 nuclear study in epic , normal perfusion with nuclear ef 61%)   Left hydrocele    OSA on CPAP    per pt uses every night   Palpitations followed by dr t. Mayford Knife   06-08-2020  still feels palipations due to PVCs when exertion but not with chest pain/ discomfort   PVC's (premature ventricular contractions) cardiologist--- dr t. turner   Status post PVC ablation by Dr. Ladona Ridgel 2019 with recurrence of frequent PVCs/bigeminy;  prior pseudobradycardia r/t pvcs   Rheumatoid arthritis involving multiple sites [facility])    rheumotology--- dr a. Sharmon Revere  ([facility] in [place])    Past Surgical History: Past Surgical History:  Procedure Laterality Date   HYDROCELE EXCISION  Left 06/14/2020   Procedure: LEFT  HYDROCELECTOMY ADULT;  Surgeon: Noel Christmas, MD;  Location: [facility];  Service: Urology;  Laterality: Left;   INCISIONAL HERNIA REPAIR  02-23-2016      LAPAROSCOPIC   LAPAROSCOPIC INGUINAL HERNIA REPAIR Bilateral 08-22-2015     AND UMBILICAL HERNIA REPAIR   PVC ABLATION N/A 10/07/2018   Procedure: PVC ABLATION;  Surgeon: Marinus Maw, MD;  Location: [facility] INVASIVE CV [facility];  Service: Cardiovascular;  Laterality: N/A;   UMBILICAL HERNIA REPAIR  child    Social History: Social History   Tobacco Use   Smoking status: Never   Smokeless tobacco: Never  Vaping Use   Vaping Use: Never used  Substance Use Topics   Alcohol use: Yes    Alcohol/week: 5.0 - 7.0 standard drinks    Types: 5 - 7 Cans of beer per week   Drug use:  Never   Additional social history:  Please also refer to relevant sections of EMR.  Family History: Family History  Problem Relation Age of Onset   Cardiomyopathy Mother    Heart attack Father      Allergies and Medications: Allergies  Allergen Reactions   Amlodipine Besylate Swelling    Leg swelling with  daily am dosing; decreased with BID  dosing   Aspirin Itching   No current facility-administered medications on file prior to encounter.   Current Outpatient Medications on File Prior to Encounter  Medication Sig Dispense Refill   acetaminophen-codeine (TYLENOL #3) 300-30 MG tablet 1-2 tablets as needed.      albuterol (VENTOLIN HFA) 108 (90 Base) MCG/ACT inhaler Inhale 2 puffs into the lungs every 4 (four) hours as needed for wheezing or shortness of breath. 8 g 2   atorvastatin (LIPITOR) 40 MG tablet Take 1 tablet (40 mg total) by mouth daily. 90 tablet 3   chlorthalidone (HYGROTON) 25 MG tablet Take 25 mg by mouth daily.     colchicine 0.6 MG tablet Take 1-2 tablets by mouth daily as needed (gout).      diclofenac (VOLTAREN) 75 MG EC tablet Take 75 mg by mouth 2 (two) times daily.     diltiazem (CARDIZEM CD) 180 MG 24 hr capsule Take 1 capsule (180 mg total) by mouth daily. 90 capsule 3   febuxostat (ULORIC) 40 MG tablet Take 1 tablet by mouth daily.     fluticasone (FLONASE) 50 MCG/ACT nasal spray SPRAY ONE SPRAY IN EACH NOSTRIL TWICE DAILY 16 g 4   Fluticasone-Salmeterol (ADVAIR) 250-50 MCG/DOSE AEPB Inhale 1 puff into the lungs every 12 (twelve) hours. 60 each 11   folic acid (FOLVITE) 1 MG tablet Take 1 tablet by mouth daily.     furosemide (LASIX) 40 MG tablet Take 1 tablet (40 mg total) by mouth 2 (two) times daily. 180 tablet 3   gabapentin (NEURONTIN) 600 MG tablet Take 600 mg by mouth 3 (three) times daily.      HUMIRA PEN 40 MG/0.4ML PNKT SMARTSIG:40 Milligram(s) SUB-Q Every 2 Weeks     hydrALAZINE (APRESOLINE) 100 MG tablet TAKE ONE TABLET BY MOUTH THREE  TIMES A DAY 270 tablet 2   KLOR-CON M20 20 MEQ tablet TAKE TWO TABLETS BY MOUTH TWICE A DAY 360 tablet 2   loratadine (CLARITIN) 10 MG tablet Take 1 tablet (10 mg total) by mouth daily. 90 tablet 3   losartan (COZAAR) 100 MG tablet Take 1 tablet (100 mg total) by mouth daily. Please keep upcoming appt in January 2022  with Dr. Mayford Knife before anymore refills. Thank you 30 tablet 0   methotrexate 2.5 MG tablet Take 15 mg by mouth once a week.     montelukast (SINGULAIR) 10 MG tablet TAKE ONE TABLET BY MOUTH EVERY NIGHT AT BEDTIME 90 tablet 0   nebivolol (BYSTOLIC) 5 MG tablet TAKE TWO TABLETS BY MOUTH EVERY NIGHT AT BEDTIME 180 tablet 3   Potassium Chloride ER 20 MEQ TBCR Take 1 tablet at lunchtime by mouth in addition to 2 tabs am/pm 30 tablet    predniSONE (DELTASONE) 5 MG tablet Take 10 mg by mouth daily.      sildenafil (REVATIO) 20 MG tablet Take 1 tablet by mouth daily as needed.      Objective: BP (!) 163/87   Pulse 83   Temp 100 F (37.8 C)   Resp (!) 24   SpO2 94%  Exam:  Physical Exam Constitutional:      General: He is not in acute distress. HENT:     Head: Normocephalic and atraumatic.     Mouth/Throat:     Mouth: Mucous membranes are moist.  Cardiovascular:     Rate and Rhythm: Normal rate. Rhythm irregular.     Pulses: Normal pulses.  Pulmonary:     Breath sounds: No wheezing.     Comments: Labored breathing. Diminished breath sound in right lung base. CTA in all other lung fields.  Chest:     Chest wall: No tenderness.  Abdominal:     General: Abdomen is flat.     Palpations: Abdomen is soft.  Musculoskeletal:     Right lower leg: Edema present.     Left lower leg: Edema present.     Comments: 1+ pitting edema in left leg, 2+ pitting edema in right leg  Skin:    General: Skin is warm.  Neurological:     Mental Status: He is alert. Mental status is at baseline.     Motor: No weakness.  Psychiatric:        Mood and Affect: Mood normal.        Behavior:  Behavior normal.     Labs and Imaging: CBC BMET  Recent Labs  Lab 05/25/21 1020  WBC 5.5  HGB 12.3*  HCT 36.4*  PLT 193   Recent Labs  Lab 05/25/21 1020  NA 133*  K 3.0*  CL 101  CO2 22  BUN 12  CREATININE 1.21  GLUCOSE 96  CALCIUM 8.7*     EKG: Multiple PVC's on EKG, evidence of right heart strain unclear  CT ANGIOGRAPHY CHEST WITH CONTRAST COMPARISON:  CT angiogram chest November 29, 2020 IMPRESSION: 1.  No demonstrable pulmonary embolus. 2. Ascending thoracic aortic diameter measures 4.4 x 4.4 cm. Measured diameter in the proximal arch measures 3.8 cm. Recommend annual imaging followup by CTA or MRA. This recommendation follows 2010 ACCF/AHA/AATS/ACR/ASA/SCA/SCAI/SIR/STS/SVM Guidelines for the Diagnosis and Management of Patients with Thoracic Aortic Disease. Circulation.2010; 121: R154-M086. Aortic aneurysm NOS (ICD10-I71.9) no evident thoracic aortic dissection. There are foci of aortic atherosclerosis. There is cardiac enlargement. 3. No evident edema or consolidation. Scattered areas of atelectatic change. No pleural effusions. 4.  No evident adenopathy.   ADDENDUM REPORT: 05/25/2021 07:49 ADDENDUM: On further review, there is a small peripheral pulmonary embolus in a lateral segment right lower lobe pulmonary artery branch.    Jovita Kussmaul, MD 05/25/2021, 2:27 PM PGY-1, [facility] Family Medicine FPTS Intern pager: [phone], text pages welcome  FPTS Upper-Level Resident Addendum   I have  independently interviewed and examined the patient. I have discussed the above with the original author and agree with their documentation. My edits for correction/addition/clarification are within the document. Please see also any attending notes.   Lavonda Jumbo, DO PGY-2, [facility] 05/25/2021 6:43 PM  FPTS Service pager: [phone] (text pages welcome through [facility])

## 2021-05-25 NOTE — Progress Notes (Signed)
ANTICOAGULATION CONSULT NOTE - Follow Up Consult  Pharmacy Consult for Heparin Indication: pulmonary embolus  Allergies  Allergen Reactions   Amlodipine Besylate Swelling    Leg swelling with 10mg  daily am dosing; decreased with BID 5mg  dosing   Aspirin Itching    Patient Measurements:   Heparin Dosing Weight: 98.9kg  Vital Signs: Temp: 99.6 F (37.6 C) (06/16 2007) Temp Source: Oral (06/16 2007) BP: 156/73 (06/16 2007) Pulse Rate: 93 (06/16 2007)  Labs: Recent Labs    05/24/21 1038 05/25/21 1020 05/25/21 1223 05/25/21 1818  HGB 11.9* 12.3*  --   --   HCT 35.4* 36.4*  --   --   PLT 171 193  --   --   APTT  --  31  --   --   LABPROT  --  13.8  --   --   INR  --  1.1  --   --   HEPARINUNFRC  --   --   --  0.15*  CREATININE 1.17 1.21  --   --   TROPONINIHS  --  47* 55*  --      Estimated Creatinine Clearance: 77.9 mL/min (by C-G formula based on SCr of 1.21 mg/dL).   Medical History: Past Medical History:  Diagnosis Date   Ascending aortic aneurysm (HCC)    4.4cm on CTA 11/2020 and 4.6cm by echo 02/2021   Bilateral lower extremity edema    Chronic gout without tophus    followed by dr 12/2020    (06-08-2020  per pt last episode right knee 3 wks ago)   CKD (chronic kidney disease), stage II    nephrology--- Sharmon Revere PA (10-29-2019 note in epic scanned in  media)   Hypertension    followed by cardiology, dr t. Virgina Norfolk   (05-14-2018 nuclear study in epic , normal perfusion with nuclear ef 61%)   Left hydrocele    OSA on CPAP    per pt uses every night   Palpitations followed by dr t. Mayford Knife   06-08-2020  still feels palipations due to PVCs when exertion but not with chest pain/ discomfort   PVC's (premature ventricular contractions) cardiologist--- dr t. turner   Status post PVC ablation by Dr. Mayford Knife 2019 with recurrence of frequent PVCs/bigeminy;  prior pseudobradycardia r/t pvcs   Rheumatoid arthritis involving multiple sites [facility])     rheumotology--- dr a. 2020  ([facility] in [place])    Medications:  Scheduled:   atorvastatin  40 mg Oral Daily   [START ON 05/26/2021] diltiazem  180 mg Oral Daily   fluticasone  1 spray Each Nare BID   [START ON 05/26/2021] fluticasone furoate-vilanterol  1 puff Inhalation Daily   folic acid  1 mg Oral Daily   furosemide  40 mg Oral BID   gabapentin  600 mg Oral TID   hydrALAZINE  100 mg Oral TID   losartan  100 mg Oral Daily   nebivolol  5 mg Oral QHS   potassium chloride SA  40 mEq Oral BID   predniSONE  10 mg Oral Daily    Assessment: 65 yo male presented on 05/25/2021 with dyspnea. CTA chest with small peripheral PE. Pharmacy consulted to dose heparin. Heparin level 0.15 on heparin 1700 units/hr is subtherapeutic. Per RN, no issues with IV infusion or access. No bleeding noted.   Goal of Therapy:  Heparin level 0.3-0.7 units/ml Monitor platelets by anticoagulation protocol: Yes   Plan:  Heparin 2900 unit bolus x1 Increase heparin to  1950 units/hr Check 6hr heparin level   Gerrit Halls, PharmD Clinical Pharmacist  05/25/2021 8:40 PM

## 2021-05-25 NOTE — ED Provider Notes (Signed)
[name] EMERGENCY DEPARTMENT Provider Note   CSN: [id number] Arrival date & time: 05/25/21  1004     History No chief complaint on file.   Ryan Glass is a 65 y.o. male.  HPI Patient presents with dyspnea, fatigue.  Patient has some chest tightness, though this is less persistent than his dyspnea.  Symptoms began about 4 days ago.  Patient did recently travel to [place], but it seems as though symptoms began prior to returning to the [place].  He notes that particularly while attempting to walk, as though through airports, he has been dyspneic, with substantial decrease in his exercise capacity compared to baseline. He notes multiple medical issues, but no history of pulmonary embolism. Yesterday was seen and evaluated by his cardiologist, had outpatient CT scan performed.  This is noted to be positive for pulmonary embolism. Patient denies fever, vomiting, states that he can do nothing to obtain comfort, no relief from his dyspnea.     Past Medical History:  Diagnosis Date   Ascending aortic aneurysm (HCC)    4.4cm on CTA 11/2020 and 4.6cm by echo 02/2021   Bilateral lower extremity edema    Chronic gout without tophus    followed by dr Sharmon Revere    (06-08-2020  per pt last episode right knee 3 wks ago)   CKD (chronic kidney disease), stage II    nephrology--- [facility] (10-29-2019 note in epic scanned in  media)   Hypertension    followed by cardiology, dr t. Mayford Knife   (05-14-2018 nuclear study in epic , normal perfusion with nuclear ef 61%)   Left hydrocele    OSA on CPAP    per pt uses every night   Palpitations followed by dr t. Mayford Knife   06-08-2020  still feels palipations due to PVCs when exertion but not with chest pain/ discomfort   PVC's (premature ventricular contractions) cardiologist--- dr t. turner   Status post PVC ablation by Dr. Ladona Ridgel 2019 with recurrence of frequent PVCs/bigeminy;  prior pseudobradycardia r/t pvcs   Rheumatoid  arthritis involving multiple sites [facility])    rheumotology--- dr a. Sharmon Revere  ([facility] in [place])    Patient Active Problem List   Diagnosis Date Noted   Thoracic aortic aneurysm without rupture (HCC) 02/01/2021   Ascending aortic aneurysm (HCC) 02/01/2021   Other hyperlipidemia 10/14/2020   Testicular swelling 05/14/2019   Pain in right knee 01/29/2019   PVC's (premature ventricular contractions)    PVC (premature ventricular contraction) 10/07/2018   Dilated aortic root (HCC)    Hip pain, acute, right 10/29/2016   Allergic rhinitis 01/10/2016   Chronic gout without tophus 01/10/2016   Drug therapy 01/10/2016   OSA (obstructive sleep apnea) 01/10/2016   Colonic polyp 12/14/2015   Rt groin pain 12/14/2015   Scoliosis of thoracolumbar spine 12/14/2015   Right lower quadrant abdominal pain 04/20/2015   Arthritis of knee 02/22/2015   Edema of extremities 02/22/2015   Essential hypertension 02/22/2015   Left-sided low back pain without sciatica 02/22/2015   Preventative health care 02/22/2015   Low serum potassium level 04/27/2014   Sleep apnea 04/27/2014   Bradycardia by electrocardiogram 04/16/2014   Chest pain 04/16/2014    Past Surgical History:  Procedure Laterality Date   HYDROCELE EXCISION Left 06/14/2020   Procedure: LEFT  HYDROCELECTOMY ADULT;  Surgeon: Noel Christmas, MD;  Location: [facility];  Service: Urology;  Laterality: Left;   INCISIONAL HERNIA REPAIR  02-23-2016   @[facility]   LAPAROSCOPIC   LAPAROSCOPIC INGUINAL HERNIA REPAIR Bilateral 08-22-2015  @[facility]    AND UMBILICAL HERNIA REPAIR   PVC ABLATION N/A 10/07/2018   Procedure: PVC ABLATION;  Surgeon: 10/09/2018, MD;  Location: [facility] INVASIVE CV LAB;  Service: Cardiovascular;  Laterality: N/A;   UMBILICAL HERNIA REPAIR  child       Family History  Problem Relation Age of Onset   Cardiomyopathy Mother    Heart attack Father     Social History   Tobacco Use   Smoking status: Never   Smokeless  tobacco: Never  Vaping Use   Vaping Use: Never used  Substance Use Topics   Alcohol use: Yes    Alcohol/week: 5.0 - 7.0 standard drinks    Types: 5 - 7 Cans of beer per week   Drug use: Never    Home Medications Prior to Admission medications   Medication Sig Start Date End Date Taking? Authorizing Provider  acetaminophen-codeine (TYLENOL #3) 300-30 MG tablet 1-2 tablets as needed.  05/06/19   [provider]  albuterol (VENTOLIN HFA) 108 (90 Base) MCG/ACT inhaler Inhale 2 puffs into the lungs every 4 (four) hours as needed for wheezing or shortness of breath. 03/02/21   Betancourt, 03/04/21, NP  atorvastatin (LIPITOR) 40 MG tablet Take 1 tablet (40 mg total) by mouth daily. 01/09/21   01/11/21, MD  chlorthalidone (HYGROTON) 25 MG tablet Take 25 mg by mouth daily. 04/02/21   [provider]  colchicine 0.6 MG tablet Take 1-2 tablets by mouth daily as needed (gout).  01/31/16   [provider]  diclofenac (VOLTAREN) 75 MG EC tablet Take 75 mg by mouth 2 (two) times daily. 08/03/20   [provider]  diltiazem (CARDIZEM CD) 180 MG 24 hr capsule Take 1 capsule (180 mg total) by mouth daily. 02/27/21   03/01/21, MD  febuxostat (ULORIC) 40 MG tablet Take 1 tablet by mouth daily. 09/30/20   [provider]  fluticasone (FLONASE) 50 MCG/ACT nasal spray SPRAY ONE SPRAY IN EACH NOSTRIL TWICE DAILY 03/22/20   Betancourt, 03/24/20, NP  Fluticasone-Salmeterol (ADVAIR) 250-50 MCG/DOSE AEPB Inhale 1 puff into the lungs every 12 (twelve) hours. 11/28/20   11/30/20, MD  folic acid (FOLVITE) 1 MG tablet Take 1 tablet by mouth daily. 01/29/20   [provider]  furosemide (LASIX) 40 MG tablet Take 1 tablet (40 mg total) by mouth 2 (two) times daily. 03/15/21 06/13/21  08/14/21, MD  gabapentin (NEURONTIN) 600 MG tablet Take 600 mg by mouth 3 (three) times daily.  02/04/19   [provider]  HUMIRA PEN 40 MG/0.4ML PNKT SMARTSIG:40  Milligram(s) SUB-Q Every 2 Weeks 04/09/21   [provider]  hydrALAZINE (APRESOLINE) 100 MG tablet TAKE ONE TABLET BY MOUTH THREE TIMES A DAY 04/03/21   Dunn, Dayna N, PA-C  KLOR-CON M20 20 MEQ tablet TAKE TWO TABLETS BY MOUTH TWICE A DAY 04/03/21   Dunn, Dayna N, PA-C  loratadine (CLARITIN) 10 MG tablet Take 1 tablet (10 mg total) by mouth daily. 08/11/20 08/11/21  Betancourt, 10/11/21, NP  losartan (COZAAR) 100 MG tablet Take 1 tablet (100 mg total) by mouth daily. Please keep upcoming appt in January 2022 with Dr. February 2022 before anymore refills. Thank you 12/16/20   02/13/21, MD  methotrexate 2.5 MG tablet Take 15 mg by mouth once a week. 06/22/20   [provider]  montelukast (SINGULAIR) 10 MG tablet TAKE ONE TABLET  BY MOUTH EVERY NIGHT AT BEDTIME 02/05/21   Betancourt, Tina A, NP  nebivolol (BYSTOLIC) 5 MG tablet TAKE TWO TABLETS BY MOUTH EVERY NIGHT AT BEDTIME 03/06/21   Quintella Reichert, MD  Potassium Chloride ER 20 MEQ TBCR Take 1 tablet at lunchtime by mouth in addition to 2 tabs am/pm 04/04/21   Betancourt, Jarold Song, NP  predniSONE (DELTASONE) 5 MG tablet Take 10 mg by mouth daily.  01/29/20   [provider]  sildenafil (REVATIO) 20 MG tablet Take 1 tablet by mouth daily as needed. 08/03/20   [provider]    Allergies    Amlodipine besylate and Aspirin  Review of Systems   Review of Systems  Constitutional:        Per HPI, otherwise negative  HENT:         Per HPI, otherwise negative  Respiratory:         Per HPI, otherwise negative  Cardiovascular:        Per HPI, otherwise negative  Gastrointestinal:  Negative for vomiting.  Endocrine:       Negative aside from HPI  Genitourinary:        Neg aside from HPI   Musculoskeletal:        Per HPI, otherwise negative  Skin: Negative.   Neurological:  Negative for syncope.   Physical Exam Updated Vital Signs BP (!) 149/84 (BP Location: Right Arm)   Pulse (!) 49   Temp 100 F (37.8 C)   Resp (!)  26   SpO2 95%   Physical Exam Vitals and nursing note reviewed.  Constitutional:      General: He is not in acute distress.    Appearance: He is well-developed.  HENT:     Head: Normocephalic and atraumatic.  Eyes:     Conjunctiva/sclera: Conjunctivae normal.  Cardiovascular:     Rate and Rhythm: Regular rhythm. Tachycardia present.  Pulmonary:     Effort: Tachypnea and accessory muscle usage present.  Abdominal:     General: There is no distension.  Skin:    General: Skin is warm and dry.  Neurological:     Mental Status: He is alert and oriented to person, place, and time.    ED Results / Procedures / Treatments   Labs (all labs ordered are listed, but only abnormal results are displayed) Labs Reviewed  CBC WITH DIFFERENTIAL/PLATELET - Abnormal; Notable for the following components:      Result Value   RBC 3.67 (*)    Hemoglobin 12.3 (*)    HCT 36.4 (*)    All other components within normal limits  SARS CORONAVIRUS 2 (TAT 6-24 HRS)  BASIC METABOLIC PANEL  PROTIME-INR  APTT  HEPARIN LEVEL (UNFRACTIONATED)  TROPONIN I (HIGH SENSITIVITY)    EKG EKG Interpretation  Date/Time:  Thursday May 25 2021 10:20:31 EDT Ventricular Rate:  105 PR Interval:  188 QRS Duration: 162 QT Interval:  450 QTC Calculation: 594 R Axis:   107 Text Interpretation: Sinus rhythm Premature ventricular complexes Baseline wander Artifact Abnormal ECG Confirmed by Gerhard Munch [id number]) on 05/25/2021 10:56:07 AM  Radiology CT Angio Chest Pulmonary Embolism (PE) W or WO Contrast  Addendum Date: 05/25/2021   ADDENDUM REPORT: 05/25/2021 08:31 ADDENDUM: Critical Value/emergent results were called by telephone at the time of interpretation on 05/25/2021 at 8:31 am to provider [facility] , who verbally acknowledged these results. Electronically Signed   By: Bretta Bang III M.D.   On: 05/25/2021 08:31  Addendum Date: 05/25/2021   ADDENDUM REPORT: 05/25/2021 07:49 ADDENDUM: On further  review, there is a small peripheral pulmonary embolus in a lateral segment right lower lobe pulmonary artery branch. Electronically Signed   By: Bretta Bang III M.D.   On: 05/25/2021 07:49   Result Date: 05/25/2021 CLINICAL DATA:  Shortness of breath EXAM: CT ANGIOGRAPHY CHEST WITH CONTRAST TECHNIQUE: Multidetector CT imaging of the chest was performed using the standard protocol during bolus administration of intravenous contrast. Multiplanar CT image reconstructions and MIPs were obtained to evaluate the vascular anatomy. CONTRAST:  80mL OMNIPAQUE IOHEXOL 350 MG/ML SOLN COMPARISON:  CT angiogram chest November 29, 2020 FINDINGS: Cardiovascular: There is no evident pulmonary embolus. Ascending thoracic aortic diameter measures 4.4 x 4.4 cm, stable. No thoracic aortic dissection. Measured diameter at the sinuses of Valsalva measures 4.3 cm. Measured diameter in the aortic arch measures 3.8 cm. Measured diameter of the descending aorta at the main pulmonary outflow tract measures 3.6 x 3.5 cm. Visualized great vessels appear unremarkable. There are occasional foci of aortic atherosclerosis. There is no pericardial effusion or pericardial thickening. Heart is mildly enlarged. Mediastinum/Nodes: There is a 1 cm nodular opacity in the left lobe of the thyroid, stable. There is a 4 mm nodular opacity in the right lobe of the thyroid. These findings do not warrant additional imaging surveillance per consensus guidelines. No evident thoracic adenopathy. No esophageal lesions are evident. Lungs/Pleura: There are scattered areas of atelectatic change in each lower lobe and in the posterior segment of the left upper lobe. There is no edema or consolidation. No pleural effusions. Fatty prominence extending along the posterior descending aorta is a stable finding, felt to be of benign etiology. Trachea and major bronchial structures appear patent. No pneumothorax. Upper Abdomen: Visualized upper abdominal structures  appear unremarkable. Musculoskeletal: No blastic or lytic bone lesions. No evident chest wall lesions. Review of the MIP images confirms the above findings. IMPRESSION: 1.  No demonstrable pulmonary embolus. 2. Ascending thoracic aortic diameter measures 4.4 x 4.4 cm. Measured diameter in the proximal arch measures 3.8 cm. Recommend annual imaging followup by CTA or MRA. This recommendation follows 2010 ACCF/AHA/AATS/ACR/ASA/SCA/SCAI/SIR/STS/SVM Guidelines for the Diagnosis and Management of Patients with Thoracic Aortic Disease. Circulation.2010; 121: N562-Z308. Aortic aneurysm NOS (ICD10-I71.9) no evident thoracic aortic dissection. There are foci of aortic atherosclerosis. There is cardiac enlargement. 3. No evident edema or consolidation. Scattered areas of atelectatic change. No pleural effusions. 4.  No evident adenopathy. Aortic Atherosclerosis (ICD10-I70.0). Electronically Signed: By: Bretta Bang III M.D. On: 05/24/2021 13:35    Procedures Procedures   Medications Ordered in ED Medications  heparin ADULT infusion 100 units/mL (25000 units/236mL) (1,700 Units/hr Intravenous New Bag/Given 05/25/21 1048)  heparin bolus via infusion 6,000 Units (6,000 Units Intravenous Bolus from Bag 05/25/21 1048)    ED Course  I have reviewed the triage vital signs and the nursing notes.  Pertinent labs & imaging results that were available during my care of the patient were reviewed by me and considered in my medical decision making (see chart for details).  Cardiac 100s sinus tach, with substantial ectopy, abnormal Pulse oximetry 99% with nasal cannula supplementation, abnormal  MDM Rules/Calculators/A&P Adult male presents with ongoing dyspnea, chest tightness. Patient is found to have pulmonary embolism on CT performed just prior to ED arrival.  PE findings consistent with his description of dyspnea, and with this, new oxygen requirement, after discussion with our pharmacists, family medicine  colleagues, patient was admitted for further monitoring, management.  MDM Number of Diagnoses or Management Options Diagnosis management comments: Pulmonary Embolism    Amount and/or Complexity of Data Reviewed Clinical lab tests: ordered and reviewed Tests in the radiology section of CPT: ordered and reviewed Tests in the medicine section of CPT: reviewed and ordered Decide to obtain previous medical records or to obtain history from someone other than the patient: yes Review and summarize past medical records: yes Discuss the patient with other providers: yes Independent visualization of images, tracings, or specimens: yes  Risk of Complications, Morbidity, and/or Mortality Presenting problems: high Diagnostic procedures: high Management options: high  Critical Care Total time providing critical care: 30-74 minutes (35)  Patient Progress Patient progress: stable   Final Clinical Impression(s) / ED Diagnoses Pulmonary embolism   Gerhard Munch, MD 05/25/21 1457

## 2021-05-25 NOTE — ED Provider Notes (Signed)
Emergency Medicine Provider Triage Evaluation Note  Ryan Glass , a 65 y.o. male  was evaluated in triage.  Pt complains of sob. Was seen by cariology and had a ct chest yesterday which showed a small PE. He was sent here for further eval.  Review of Systems  Positive: Sob, chest pain Negative: vomiting  Physical Exam  There were no vitals taken for this visit. Gen:   Awake, no distress   Resp:  Normal effort  MSK:   Moves extremities without difficulty  Other:  No tachypnea  Medical Decision Making  Medically screening exam initiated at 10:18 AM.  Appropriate orders placed.  Ryan Glass was informed that the remainder of the evaluation will be completed by another provider, this initial triage assessment does not replace that evaluation, and the importance of remaining in the ED until their evaluation is complete.  Nursing made aware that pt needs to be prioritized for a room   Rayne Du 05/25/21 1022    Koleen Distance, MD 05/25/21 1105

## 2021-05-25 NOTE — ED Notes (Signed)
Attempted report, call back number left.

## 2021-05-26 ENCOUNTER — Other Ambulatory Visit (HOSPITAL_COMMUNITY): Payer: Self-pay

## 2021-05-26 ENCOUNTER — Encounter (HOSPITAL_COMMUNITY): Admission: RE | Payer: Self-pay | Source: Home / Self Care

## 2021-05-26 ENCOUNTER — Inpatient Hospital Stay (HOSPITAL_COMMUNITY): Payer: No Typology Code available for payment source

## 2021-05-26 ENCOUNTER — Encounter (HOSPITAL_COMMUNITY): Payer: Self-pay | Admitting: Family Medicine

## 2021-05-26 ENCOUNTER — Ambulatory Visit (HOSPITAL_COMMUNITY)
Admission: RE | Admit: 2021-05-26 | Payer: No Typology Code available for payment source | Source: Home / Self Care | Admitting: Cardiovascular Disease

## 2021-05-26 DIAGNOSIS — I493 Ventricular premature depolarization: Secondary | ICD-10-CM | POA: Diagnosis not present

## 2021-05-26 DIAGNOSIS — R0600 Dyspnea, unspecified: Secondary | ICD-10-CM | POA: Diagnosis not present

## 2021-05-26 DIAGNOSIS — I472 Ventricular tachycardia: Secondary | ICD-10-CM

## 2021-05-26 DIAGNOSIS — I2693 Single subsegmental pulmonary embolism without acute cor pulmonale: Secondary | ICD-10-CM | POA: Diagnosis not present

## 2021-05-26 DIAGNOSIS — R0789 Other chest pain: Secondary | ICD-10-CM

## 2021-05-26 DIAGNOSIS — I2699 Other pulmonary embolism without acute cor pulmonale: Secondary | ICD-10-CM

## 2021-05-26 DIAGNOSIS — R0602 Shortness of breath: Secondary | ICD-10-CM

## 2021-05-26 DIAGNOSIS — R6889 Other general symptoms and signs: Secondary | ICD-10-CM

## 2021-05-26 LAB — ECHOCARDIOGRAM COMPLETE
AR max vel: 3.94 cm2
AV Area VTI: 4.24 cm2
AV Area mean vel: 4.47 cm2
AV Mean grad: 7 mmHg
AV Peak grad: 13.1 mmHg
Ao pk vel: 1.81 m/s
Area-P 1/2: 3.27 cm2
Calc EF: 51.6 %
S' Lateral: 4.6 cm
Single Plane A2C EF: 54 %
Single Plane A4C EF: 43.2 %

## 2021-05-26 LAB — BASIC METABOLIC PANEL
Anion gap: 10 (ref 5–15)
Anion gap: 10 (ref 5–15)
BUN: 14 mg/dL (ref 8–23)
BUN: 20 mg/dL (ref 8–23)
CO2: 25 mmol/L (ref 22–32)
CO2: 24 mmol/L (ref 22–32)
Calcium: 9.1 mg/dL (ref 8.9–10.3)
Calcium: 8.8 mg/dL — ABNORMAL LOW (ref 8.9–10.3)
Chloride: 100 mmol/L (ref 98–111)
Chloride: 100 mmol/L (ref 98–111)
Creatinine, Ser: 1.13 mg/dL (ref 0.61–1.24)
Creatinine, Ser: 1.68 mg/dL — ABNORMAL HIGH (ref 0.61–1.24)
GFR, Estimated: >60 mL/min (ref >60)
GFR, Estimated: 45 mL/min — ABNORMAL LOW (ref >60)
Glucose, Bld: 116 mg/dL — ABNORMAL HIGH (ref 70–99)
Glucose, Bld: 135 mg/dL — ABNORMAL HIGH (ref 70–99)
Potassium: 3.1 mmol/L — ABNORMAL LOW (ref 3.5–5.1)
Potassium: 2.9 mmol/L — ABNORMAL LOW (ref 3.5–5.1)
Sodium: 135 mmol/L (ref 135–145)
Sodium: 134 mmol/L — ABNORMAL LOW (ref 135–145)

## 2021-05-26 LAB — CBC
HCT: 37.4 % — ABNORMAL LOW (ref 39.0–52.0)
Hemoglobin: 13.2 g/dL (ref 13.0–17.0)
MCH: 34.1 pg — ABNORMAL HIGH (ref 26.0–34.0)
MCHC: 35.3 g/dL (ref 30.0–36.0)
MCV: 96.6 fL (ref 80.0–100.0)
Platelets: 178 10*3/uL (ref 150–400)
RBC: 3.87 MIL/uL — ABNORMAL LOW (ref 4.22–5.81)
RDW: 15.2 % (ref 11.5–15.5)
WBC: 5.2 10*3/uL (ref 4.0–10.5)
nRBC: 0 % (ref 0.0–0.2)

## 2021-05-26 LAB — HEPARIN LEVEL (UNFRACTIONATED)
Heparin Unfractionated: 0.47 IU/mL (ref 0.30–0.70)
Heparin Unfractionated: 0.24 IU/mL — ABNORMAL LOW (ref 0.30–0.70)

## 2021-05-26 LAB — MAGNESIUM: Magnesium: 1.9 mg/dL (ref 1.7–2.4)

## 2021-05-26 LAB — TSH: TSH: 0.559 u[IU]/mL (ref 0.350–4.500)

## 2021-05-26 SURGERY — RIGHT/LEFT HEART CATH AND CORONARY ANGIOGRAPHY
Anesthesia: LOCAL

## 2021-05-26 MED ORDER — SODIUM CHLORIDE 0.9% FLUSH
3.0000 mL | Freq: Two times a day (BID) | INTRAVENOUS | Status: DC
  Administered 2021-05-27 – 2021-05-29 (×4): 3 mL via INTRAVENOUS

## 2021-05-26 MED ORDER — MAGNESIUM OXIDE -MG SUPPLEMENT 400 (240 MG) MG PO TABS
400.0000 mg | ORAL_TABLET | Freq: Every day | ORAL | Status: DC
  Administered 2021-05-27 – 2021-05-30 (×4): 400 mg via ORAL
  Filled 2021-05-26 (×4): qty 1

## 2021-05-26 MED ORDER — NEBIVOLOL HCL 10 MG PO TABS
10.0000 mg | ORAL_TABLET | Freq: Every day | ORAL | Status: DC
  Administered 2021-05-26 – 2021-05-29 (×4): 10 mg via ORAL
  Filled 2021-05-26 (×4): qty 1

## 2021-05-26 MED ORDER — BENZONATATE 100 MG PO CAPS
100.0000 mg | ORAL_CAPSULE | Freq: Two times a day (BID) | ORAL | Status: DC | PRN
  Administered 2021-05-26 – 2021-05-29 (×3): 100 mg via ORAL
  Filled 2021-05-26 (×4): qty 1

## 2021-05-26 MED ORDER — MAGNESIUM SULFATE IN D5W 1-5 GM/100ML-% IV SOLN
1.0000 g | Freq: Once | INTRAVENOUS | Status: AC
  Administered 2021-05-26: 1 g via INTRAVENOUS
  Filled 2021-05-26: qty 100

## 2021-05-26 MED ORDER — POTASSIUM CHLORIDE 10 MEQ/100ML IV SOLN
10.0000 meq | INTRAVENOUS | Status: AC
  Administered 2021-05-26 (×4): 10 meq via INTRAVENOUS
  Filled 2021-05-26 (×4): qty 100

## 2021-05-26 NOTE — TOC Benefit Eligibility Note (Signed)
Patient Product/process development scientist completed.    The patient is currently admitted and upon discharge could be taking Eliquis 5 mg  The current 30 day co-pay is, $100.00.   The patient is currently admitted and upon discharge could be taking Xarelto 20mg   The current 30 day co-pay is, $100.00.   The patient is insured through     [employer], CPhT Pharmacy Patient Advocate Specialist [facility] Antimicrobial Stewardship Team Direct Number: [phone]  Fax: [phone]

## 2021-05-26 NOTE — Progress Notes (Signed)
*  PRELIMINARY RESULTS* Echocardiogram 2D Echocardiogram has been performed.  Neomia Dear RDCS 05/26/2021, 2:39 PM

## 2021-05-26 NOTE — Progress Notes (Signed)
ANTICOAGULATION CONSULT NOTE  Pharmacy Consult for Heparin Indication: pulmonary embolus  Allergies  Allergen Reactions   Amlodipine Besylate Swelling    Leg swelling with 10mg  daily am dosing; decreased with BID 5mg  dosing   Aspirin Itching    Patient Measurements:   Heparin Dosing Weight: 98.9kg  Vital Signs: Temp: 98.4 F (36.9 C) (06/16 2331) Temp Source: Axillary (06/16 2331) BP: 133/60 (06/16 2331) Pulse Rate: 73 (06/16 2331)  Labs: Recent Labs    05/24/21 1038 05/25/21 1020 05/25/21 1223 05/25/21 1818 05/25/21 2019 05/25/21 2200 05/26/21 0312  HGB 11.9* 12.3*  --   --   --   --  13.2  HCT 35.4* 36.4*  --   --   --   --  37.4*  PLT 171 193  --   --   --   --  178  APTT  --  31  --   --   --   --   --   LABPROT  --  13.8  --   --   --   --   --   INR  --  1.1  --   --   --   --   --   HEPARINUNFRC  --   --   --  0.15*  --   --  0.47  CREATININE 1.17 1.21  --   --   --   --  1.13  TROPONINIHS  --  47*   < > 51* 53* 55*  --    < > = values in this interval not displayed.     Estimated Creatinine Clearance: 83.4 mL/min (by C-G formula based on SCr of 1.13 mg/dL).  Assessment: 65 y.o. male with PE for heparin  Goal of Therapy:  Heparin level 0.3-0.7 units/ml Monitor platelets by anticoagulation protocol: Yes   Plan:  Continue Heparin at current rate  F/U plan for oral anticoagulation  05/28/21, PharmD, BCPS  05/26/2021 4:23 AM

## 2021-05-26 NOTE — Progress Notes (Signed)
Mobility Specialist: Progress Note   05/26/21 1346  Mobility  Activity Ambulated in hall  Level of Assistance Standby assist, set-up cues, supervision of patient - no hands on  Assistive Device  (IV Pole)  Distance Ambulated (ft) 300 ft  Mobility Ambulated independently in hallway  Mobility Response Tolerated fair  Mobility performed by Mobility specialist  $Mobility charge 1 Mobility   Pre-Mobility: 84 HR, 89-90% SpO2 During Mobility: 77-113 HR, 92-93% SpO2 Post-Mobility: 80 HR, 141/62 BP, 93% SpO2  Pt with DOE. Pt had no c/o pain, dizziness, or feeling light headed. Pt back to bed per request with call bell by his side.   [facility] Azhar Yogi Mobility Specialist Mobility Specialist Phone: [phone]

## 2021-05-26 NOTE — Progress Notes (Signed)
Family Medicine Teaching Service Daily Progress Note Intern Pager: [phone]  Patient name: Ryan Glass Medical record number: [id number] Date of birth: May 30, 1956 Age: 65 y.o. Gender: male  Primary Care Provider: [name], MD Consultants: Cardiology Code Status: Full  Pt Overview and Major Events to Date:  6/16: admitted   Assessment and Plan:  Ryan Glass is a 65 year old male who presented with chest pain and dyspnea and was found to have acute PE. PMH significant for PVCs s/p ablation (2019), dilated aortic root, HTN, HLD, OSA, rheumatoid arthritis, and gout.  Acute Pulmonary Embolus Small peripheral PE in lateral segment of right lower lobe pulmonary artery branch noted on outpatient CT. Patient breathing comfortably on room air this morning. -Continue heparin gtt -f/u DVT ultrasound -Obtain echo -Cardiology following, appreciate recommendations  Elevated Troponin Trop 47>55>51>53>55. Likely mild demand ischemia from PE. However, patient sent in by his cardiologist and was scheduled for cath on 05/26/2021. -Cardiology following, appreciate recommendations -?proceed with cath while inpatient  Dyspnea on Exertion  HFpEF Being workup up by cards and pulm since 2020. -Cardiology following, appreciate recommendations -f/u echo -Continue home Lasix 40mg  BID and potassium supplementation BID  PVCs s/p Ablation -Cardiology following, appreciate recommendations -Continue home Diltiazem and nebivolol   HTN Normotensive this morning. -Continue home Losartan 100mg  daily and hydral 100mg  TID -Also continuing home Bystolic -Holding home chlorthalidone  Hypokalemia K 3.1 this morning -Holding home chlorthalidone -Repleted with IV potassium x4 - PO potassium BID -Daily BMP  Rheumatoid Arthritis Stable. On Humira, MTX, and prednisone at home. -Continue home prednisone -folic acid 1mg  daily  Gout Not in acute flare. States he takes colchicine only  as needed at home. Febuxostat listed as daily home med but patient not familiar with this. -Holding febuxostat and colchicine   FEN/GI: heart healthy diet PPx: Heparin gtt   Status is: Inpatient Remains inpatient appropriate because:Ongoing diagnostic testing needed not appropriate for outpatient work up and Inpatient level of care appropriate due to severity of illness  Dispo: The patient is from: Home              Anticipated d/c is to: Home              Patient currently is not medically stable to d/c.   Difficult to place patient No    Subjective:  No acute events overnight. Patient denies complaints this morning. Wondering about his planned cath procedure.  Objective: Temp:  [97.8 F (36.6 C)-100 F (37.8 C)] 97.8 F (36.6 C) (06/17 0424) Pulse Rate:  [29-93] 70 (06/17 0424) Resp:  [13-33] 20 (06/17 0424) BP: (133-165)/(60-101) 137/78 (06/17 0424) SpO2:  [92 %-96 %] 95 % (06/17 0424) Physical Exam: Gen: NAD, pleasant, able to participate in exam Cardiac: RRR, S1 S2 present. normal heart sounds, no murmurs. Resp: CTAB, normal effort, No wheezes, rales or rhonchi Abdomen: soft, non-tender, non-distended Extremities: 1+ pitting edema bilateral lower extremities Skin: warm and dry, no rashes noted Neuro: alert, no obvious focal deficits Psych: Normal affect and mood   Laboratory: Recent Labs  Lab 05/24/21 1038 05/25/21 1020 05/26/21 0312  WBC 6.5 5.5 5.2  HGB 11.9* 12.3* 13.2  HCT 35.4* 36.4* 37.4*  PLT 171 193 178   Recent Labs  Lab 05/24/21 1038 05/25/21 1020 05/26/21 0312  NA 141 133* 135  K 3.3* 3.0* 3.1*  CL 107* 101 100  CO2 22 22 25   BUN 18 12 14   CREATININE 1.17 1.21 1.13  CALCIUM  9.1 8.7* 9.1  GLUCOSE 129* 96 116*      Imaging/Diagnostic Tests: CT Angio Chest Pulmonary Embolism (PE) W or WO Contrast  Addendum Date: 05/25/2021   ADDENDUM REPORT: 05/25/2021 08:31 ADDENDUM: Critical Value/emergent results were called by telephone at the  time of interpretation on 05/25/2021 at 8:31 am to provider [facility] , who verbally acknowledged these results. Electronically Signed   By: Bretta Bang III M.D.   On: 05/25/2021 08:31   Addendum Date: 05/25/2021   ADDENDUM REPORT: 05/25/2021 07:49 ADDENDUM: On further review, there is a small peripheral pulmonary embolus in a lateral segment right lower lobe pulmonary artery branch. Electronically Signed   By: Bretta Bang III M.D.   On: 05/25/2021 07:49   Result Date: 05/25/2021 CLINICAL DATA:  Shortness of breath EXAM: CT ANGIOGRAPHY CHEST WITH CONTRAST TECHNIQUE: Multidetector CT imaging of the chest was performed using the standard protocol during bolus administration of intravenous contrast. Multiplanar CT image reconstructions and MIPs were obtained to evaluate the vascular anatomy. CONTRAST:  103mL OMNIPAQUE IOHEXOL 350 MG/ML SOLN COMPARISON:  CT angiogram chest November 29, 2020 FINDINGS: Cardiovascular: There is no evident pulmonary embolus. Ascending thoracic aortic diameter measures 4.4 x 4.4 cm, stable. No thoracic aortic dissection. Measured diameter at the sinuses of Valsalva measures 4.3 cm. Measured diameter in the aortic arch measures 3.8 cm. Measured diameter of the descending aorta at the main pulmonary outflow tract measures 3.6 x 3.5 cm. Visualized great vessels appear unremarkable. There are occasional foci of aortic atherosclerosis. There is no pericardial effusion or pericardial thickening. Heart is mildly enlarged. Mediastinum/Nodes: There is a 1 cm nodular opacity in the left lobe of the thyroid, stable. There is a 4 mm nodular opacity in the right lobe of the thyroid. These findings do not warrant additional imaging surveillance per consensus guidelines. No evident thoracic adenopathy. No esophageal lesions are evident. Lungs/Pleura: There are scattered areas of atelectatic change in each lower lobe and in the posterior segment of the left upper lobe. There is no edema or  consolidation. No pleural effusions. Fatty prominence extending along the posterior descending aorta is a stable finding, felt to be of benign etiology. Trachea and major bronchial structures appear patent. No pneumothorax. Upper Abdomen: Visualized upper abdominal structures appear unremarkable. Musculoskeletal: No blastic or lytic bone lesions. No evident chest wall lesions. Review of the MIP images confirms the above findings. IMPRESSION: 1.  No demonstrable pulmonary embolus. 2. Ascending thoracic aortic diameter measures 4.4 x 4.4 cm. Measured diameter in the proximal arch measures 3.8 cm. Recommend annual imaging followup by CTA or MRA. This recommendation follows 2010 ACCF/AHA/AATS/ACR/ASA/SCA/SCAI/SIR/STS/SVM Guidelines for the Diagnosis and Management of Patients with Thoracic Aortic Disease. Circulation.2010; 121: J478-G956. Aortic aneurysm NOS (ICD10-I71.9) no evident thoracic aortic dissection. There are foci of aortic atherosclerosis. There is cardiac enlargement. 3. No evident edema or consolidation. Scattered areas of atelectatic change. No pleural effusions. 4.  No evident adenopathy. Aortic Atherosclerosis (ICD10-I70.0). Electronically Signed: By: Bretta Bang III M.D. On: 05/24/2021 13:35     Maury Dus, MD 05/26/2021, 7:32 AM PGY-1, [facility] FPTS Intern pager: [phone], text pages welcome

## 2021-05-26 NOTE — Consult Note (Addendum)
Cardiology Consultation:   Patient ID: Ryan Glass MRN: [id number]; DOB: 10-11-1956  Admit date: 05/25/2021 Date of Consult: 05/26/2021  PCP:  [name], MD   [facility] Providers Cardiologist:  [name], MD  Electrophysiologist:  [name], MD       Patient Profile:   Ryan Glass is a 65 y.o. 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 Clayborne Artist.  History of Present Illness:   Mr. Agar remotely followed by Dr. Lady Gary then established care with Dr. Mayford Knife in 03/2018 for episodic atypical chest pain and frequent PVCs. 2D echo 04/2018 showed EF 60-65%, grade 2 DD, moderately dilated aortic root, mildly dilated ascending aorta, moderately dilated LA, mildly dilated RV. His TAA has been followed by serial CTs. Nuclear stress test 05/2018 was normal. Event monitor at that time showed 22% PVC load. He was referred to Dr. Ladona Ridgel who felt that the PVCs were originating to the lateral wall of the inferior apex of the RV. The patient underwent successful PVC ablation in November 2019 and initially did well. Atenolol was weaned off. He later had addition of Bystolic for blood pressure. He also has a history of edema, previously managed with chlorthalidone. He was seen in 07/2019 due to HR reporting 30s by home monitor. This was felt to be pseudobradycardia in the context of recurrent frequent PVCs. The PVCs appeared to be coming from high in the LV, different foci than previous. Repeat 2-day event monitor showed frequent PVCs (8.2%), bundle branch block present, 1 short run SVT, rare PACs, and occasional ventricular bigeminy and trigeminy. Dr. Ladona Ridgel  recommended titration of Bystolic. He is also very engaged with the occupational health program through his work and is cared for by Albina Billet, FNP ([facility] [employer]). In summer 2021 he had Covid-19 PNA and subsequently developed progressive DOE. He had had 3 vaccines but while on treatment with methotrexate for RA. He was seen by pulmonology. PFTs showed mild decrease in DLCO. Initially there was question of post-Covid ILD but last note suggests this was possibly due to deconditioning/obesity. They reviewed CTA from 11/2020 and did not feel this reflected any interstitial lung disease. He was trialed on inhaler. He saw Dr. Mayford Knife in follow-up earlier this year for his SOB. In 01/2021 he was instructed to d/c chlorthalidone and start Lasix (although notably chlorthalidone appeared back on "taking" list this month). Nuclear stress test 02/2021 was normal and 2D echo showed normal LV function with moderate LVH, moderate BAE, 35mm aortic root aneurysm, 59mm ascending aorta. Repeat monitor 02/2021 showed average HR 71bpm, HR range 52->167bpm, 5 runs NSVT (longest 5 beats), 7 brief runs of SVT, 10% PVCs with couplets, triplets, bigeminy and trigeminy, rare PACs. Dr. Mayford Knife stopped amlodipine and added diltiazem. The patient saw Dr. Ladona Ridgel in follow-up 03/2021 who did not advise any changes in regimen aside from titration of Lasix for edema. Dr. Ladona Ridgel felt that if repeat ablation was pursued the patient would develop PVCs from another location.  The patient recently returned from a monthlong trip to [place]. He took a 3-4 hr flight to [place] then a 10 hr flight to [place] followed by a subsequent 2-3 hour flight  to his home town. About 2 weeks ago while in [place] he noticed significant worsening of his previous dyspnea on exertion. He found that he was not able to do any of his normal activities without getting short of breath. He also has had 4-5 episodes of chest tightness occurring exclusively with higher levels of  activity, relieved with rest. The last time this happened was Monday 6/13 while at work. He was seen in the office Wednesday 6/15. O2 sat was 92% RA. Dr. Mayford Knife ordered CTA and recommended to consider R/LHC if that was negative. Initial read said no PE but follow-up overread suggestive of small peripheral pulmonary embolus in lateral segment of right lower lobe pulmonary artery branch. His TAA was 4.4x4.4cm, continued annual f/u recommended. He was called and instructed to go to the ER. CT also showed scattered areas of atelectasis, no edema, consolidation or pleural effusions. hsTroponins 47->55->51->53->55, initial hyponatremia 133, hypokalemia 3.0, mild anemia Hgb 12.3, Covid negative. LE venous duplex was preliminarily negative. Chlorthalidone was held due to hypokalemia. 2D echo pending. O2 sat 89-95% RA.  He is currently asymptomatic at rest.  Telemetry and EKG is notable for high PVC burden including a few bursts of NSVT 4-7 beats, frequent couplets, triplets, and ventricular bigeminy. No syncope, orthopnea, or worsening edema reported. Has occasional cough with sinus drainage after lying down but this is not new. 2D echo pending.   Past Medical History:  Diagnosis Date   Ascending aortic aneurysm (HCC)    a. CT 05/2021 showed 4.4x4.4cm TAA.   Bilateral lower extremity edema    Chronic gout without tophus    followed by dr Sharmon Revere    (06-08-2020  per pt last episode right knee 3 wks ago)   CKD (chronic kidney disease), stage II    nephrology--- [facility] (10-29-2019 note in epic scanned in  media)   Hypertension    followed by cardiology, dr t. Mayford Knife   (05-14-2018 nuclear study in epic , normal perfusion with nuclear ef 61%)   Left hydrocele    OSA on CPAP    per pt uses every night   Palpitations followed by dr t. Mayford Knife   06-08-2020  still feels palipations due to PVCs when exertion but not with chest pain/ discomfort   Pneumonia due to COVID-19 virus    PVC's (premature  ventricular contractions) cardiologist--- dr t. turner   Status post PVC ablation by Dr. Ladona Ridgel 2019 with recurrence of frequent PVCs/bigeminy;  prior pseudobradycardia r/t pvcs   Rheumatoid arthritis involving multiple sites [facility])    rheumotology--- dr a. Sharmon Revere  ([facility] in [place])    Past Surgical History:  Procedure Laterality Date   HYDROCELE EXCISION Left 06/14/2020   Procedure: LEFT  HYDROCELECTOMY ADULT;  Surgeon: Noel Christmas, MD;  Location: [facility];  Service: Urology;  Laterality: Left;   INCISIONAL HERNIA REPAIR  02-23-2016      LAPAROSCOPIC   LAPAROSCOPIC INGUINAL HERNIA REPAIR Bilateral 08-22-2015     AND UMBILICAL HERNIA REPAIR   PVC ABLATION N/A 10/07/2018   Procedure: PVC ABLATION;  Surgeon: Marinus Maw, MD;  Location: [facility];  Service: Cardiovascular;  Laterality: N/A;   UMBILICAL HERNIA REPAIR  child     Home Medications:  Prior to Admission medications   Medication Sig Start Date End Date Taking? Authorizing Provider  acetaminophen-codeine (TYLENOL #3) 300-30 MG tablet Take 1-2 tablets by mouth as needed for moderate pain. 05/06/19  Yes [provider]  albuterol (  VENTOLIN HFA) 108 (90 Base) MCG/ACT inhaler Inhale 2 puffs into the lungs every 4 (four) hours as needed for wheezing or shortness of breath. 03/02/21  Yes Betancourt, Tina A, NP  atorvastatin (LIPITOR) 40 MG tablet Take 1 tablet (40 mg total) by mouth daily. 01/09/21  Yes Turner, Cornelious Bryant, MD  chlorthalidone (HYGROTON) 25 MG tablet Take 25 mg by mouth daily. 04/02/21  Yes [provider]  colchicine 0.6 MG tablet Take 1-2 tablets by mouth daily as needed (gout).  01/31/16  Yes [provider]  diclofenac (VOLTAREN) 75 MG EC tablet Take 75 mg by mouth 2 (two) times daily. 08/03/20  Yes [provider]  diltiazem (CARDIZEM CD) 180 MG 24 hr capsule Take 1 capsule (180 mg total) by mouth daily. 02/27/21  Yes Turner, Cornelious Bryant, MD  fluticasone  (FLONASE) 50 MCG/ACT nasal spray SPRAY ONE SPRAY IN EACH NOSTRIL TWICE DAILY Patient taking differently: Place 1 spray into both nostrils 2 (two) times daily. 03/22/20  Yes Betancourt, Jarold Song, NP  Fluticasone-Salmeterol (ADVAIR) 250-50 MCG/DOSE AEPB Inhale 1 puff into the lungs every 12 (twelve) hours. 11/28/20  Yes Martina Sinner, MD  folic acid (FOLVITE) 1 MG tablet Take 1 mg by mouth daily. 01/29/20  Yes [provider]  furosemide (LASIX) 40 MG tablet Take 1 tablet (40 mg total) by mouth 2 (two) times daily. 03/15/21 06/13/21 Yes Marinus Maw, MD  gabapentin (NEURONTIN) 600 MG tablet Take 600 mg by mouth 3 (three) times daily.  02/04/19  Yes [provider]  HUMIRA PEN 40 MG/0.4ML PNKT Inject 40 mg as directed every 14 (fourteen) days. 04/09/21  Yes [provider]  hydrALAZINE (APRESOLINE) 100 MG tablet TAKE ONE TABLET BY MOUTH THREE TIMES A DAY Patient taking differently: Take 100 mg by mouth 3 (three) times daily. 04/03/21  Yes Siona Coulston N, PA-C  KLOR-CON M20 20 MEQ tablet TAKE TWO TABLETS BY MOUTH TWICE A DAY Patient taking differently: Take 20 mEq by mouth 2 (two) times daily. 04/03/21  Yes Kourtland Coopman N, PA-C  losartan (COZAAR) 100 MG tablet Take 1 tablet (100 mg total) by mouth daily. Please keep upcoming appt in January 2022 with Dr. Mayford Knife before anymore refills. Thank you 12/16/20  Yes Quintella Reichert, MD  methotrexate 2.5 MG tablet Take 15 mg by mouth every Friday. 06/22/20  Yes [provider]  nebivolol (BYSTOLIC) 5 MG tablet TAKE TWO TABLETS BY MOUTH EVERY NIGHT AT BEDTIME Patient taking differently: Take 5 mg by mouth at bedtime. 03/06/21  Yes Turner, Cornelious Bryant, MD  Potassium Chloride ER 20 MEQ TBCR Take 1 tablet at lunchtime by mouth in addition to 2 tabs am/pm Patient taking differently: Take 20-40 mEq by mouth See admin instructions. Take 1 tablet at lunchtime by mouth in addition to 2 tabs am/pm 04/04/21  Yes Betancourt, Inetta Fermo A, NP  predniSONE  (DELTASONE) 5 MG tablet Take 10 mg by mouth daily.  01/29/20  Yes [provider]  sildenafil (REVATIO) 20 MG tablet Take 20 mg by mouth daily as needed (erectile dysfunction). 08/03/20  Yes [provider]  montelukast (SINGULAIR) 10 MG tablet TAKE ONE TABLET BY MOUTH EVERY NIGHT AT BEDTIME Patient not taking: No sig reported 02/05/21   Barbaraann Barthel, NP    Inpatient Medications: Scheduled Meds:  atorvastatin  40 mg Oral Daily   diltiazem  180 mg Oral Daily   fluticasone  1 spray Each Nare BID   fluticasone furoate-vilanterol  1 puff Inhalation  Daily   folic acid  1 mg Oral Daily   furosemide  40 mg Oral BID   gabapentin  600 mg Oral TID   hydrALAZINE  100 mg Oral TID   losartan  100 mg Oral Daily   [START ON 05/27/2021] magnesium oxide  400 mg Oral Daily   nebivolol  5 mg Oral QHS   potassium chloride SA  40 mEq Oral BID   predniSONE  10 mg Oral Daily   Continuous Infusions:  heparin 2,000 Units/hr (05/26/21 0826)   magnesium sulfate bolus IVPB     PRN Meds: acetaminophen, albuterol  Allergies:    Allergies  Allergen Reactions   Amlodipine Besylate Swelling    Leg swelling with 10mg  daily am dosing; decreased with BID 5mg  dosing   Aspirin Itching    Social History:   Social History   Socioeconomic History   Marital status: Married    Spouse name: Not on file   Number of children: Not on file   Years of education: Not on file   Highest education level: Not on file  Occupational History   Not on file  Tobacco Use   Smoking status: Never   Smokeless tobacco: Never  Vaping Use   Vaping Use: Never used  Substance and Sexual Activity   Alcohol use: Yes    Alcohol/week: 5.0 - 7.0 standard drinks    Types: 5 - 7 Cans of beer per week   Drug use: Never   Sexual activity: Not on file  Other Topics Concern   Not on file  Social History Narrative   Not on file   Social Determinants of Health   Financial Resource Strain: Not on file  Food  Insecurity: Not on file  Transportation Needs: Not on file  Physical Activity: Not on file  Stress: Not on file  Social Connections: Not on file  Intimate Partner Violence: Not on file    Family History:    Family History  Problem Relation Age of Onset   Cardiomyopathy Mother    Heart attack Father      ROS:  Please see the history of present illness.  All other ROS reviewed and negative.     Physical Exam/Data:   Vitals:   05/26/21 0424 05/26/21 0749 05/26/21 0832 05/26/21 1152  BP: 137/78 121/72  133/81  Pulse: 70 86  71  Resp: 20 20  18   Temp: 97.8 F (36.6 C) 98.1 F (36.7 C)  99 F (37.2 C)  TempSrc: Oral Oral  Oral  SpO2: 95% 93% 94%     Intake/Output Summary (Last 24 hours) at 05/26/2021 1441 Last data filed at 05/26/2021 1325 Gross per 24 hour  Intake 1006.9 ml  Output 2250 ml  Net -1243.1 ml   Last 3 Weights 05/24/2021 05/23/2021 04/13/2021  Weight (lbs) 257 lb 6.4 oz 250 lb 252 lb 8 oz  Weight (kg) 116.756 kg 113.399 kg 114.533 kg     There is no height or weight on file to calculate BMI.  General: Well developed, well nourished hispanic M, in no acute distress. Head: Normocephalic, atraumatic, sclera non-icteric, no xanthomas, nares are without discharge. Neck: Negative for carotid bruits. JVP not elevated. Lungs: Clear bilaterally to auscultation without wheezes, rales, or rhonchi. Breathing is unlabored. Heart: RRR S1 S2 without murmurs, rubs, or gallops.  Abdomen: Soft, non-tender, non-distended with normoactive bowel sounds. No rebound/guarding. Extremities: No clubbing or cyanosis. No edema. Distal pedal pulses are 2+ and equal bilaterally. Neuro: Alert and  oriented X 3. Moves all extremities spontaneously. Psych:  Responds to questions appropriately with a normal affect.   EKG:  The EKG was personally reviewed and demonstrates:   NSR with frequent PVCs with couplets and one triplet, RBBB Subsequent tracing appears similar but with one 4 beat run  of ventricular ectopy  Telemetry:  Telemetry was personally reviewed and demonstrates:  outlined above  Relevant CV Studies: Pending  2D Echo 02/2021   1. Left ventricular ejection fraction, by estimation, is 60 to 65%. The  left ventricle has normal function. The left ventricle has no regional  wall motion abnormalities. There is moderate left ventricular hypertrophy.  Left ventricular diastolic  parameters are indeterminate.   2. Right ventricular systolic function is normal. The right ventricular  size is mildly enlarged. Tricuspid regurgitation signal is inadequate for  assessing PA pressure.   3. Left atrial size was moderately dilated.   4. Right atrial size was moderately dilated.   5. The mitral valve is normal in structure. Trivial mitral valve  regurgitation.   6. The aortic valve is tricuspid. Aortic valve regurgitation is not  visualized. No aortic stenosis is present.   7. Aortic dilatation noted. Aneurysm of the aortic root, measuring 46 mm.  There is dilatation of the ascending aorta, measuring 41 mm.   8. The inferior vena cava is normal in size with greater than 50%  respiratory variability, suggesting right atrial pressure of 3 mmHg.  Laboratory Data:  High Sensitivity Troponin:   Recent Labs  Lab 05/25/21 1020 05/25/21 1223 05/25/21 1818 05/25/21 2019 05/25/21 2200  TROPONINIHS 47* 55* 51* 53* 55*     Chemistry Recent Labs  Lab 05/24/21 1038 05/25/21 1020 05/26/21 0312  NA 141 133* 135  K 3.3* 3.0* 3.1*  CL 107* 101 100  CO2 GLUCOSE 129* 96 116*  BUN CREATININE 1.17 1.21 1.13  CALCIUM 9.1 8.7* 9.1  GFRNONAA  --  >60 >60  ANIONGAP  --  10 10    No results for input(s): PROT, ALBUMIN, AST, ALT, ALKPHOS, BILITOT in the last 168 hours. Hematology Recent Labs  Lab 05/24/21 1038 05/25/21 1020 05/26/21 0312  WBC 6.5 5.5 5.2  RBC 3.61* 3.67* 3.87*  HGB 11.9* 12.3* 13.2  HCT 35.4* 36.4* 37.4*  MCV 98* 99.2 96.6  MCH  33.0 33.5 34.1*  MCHC 33.6 33.8 35.3  RDW 15.3 15.1 15.2  PLT 171 193 178   BNPNo results for input(s): BNP, PROBNP in the last 168 hours.  DDimer No results for input(s): DDIMER in the last 168 hours.   Radiology/Studies:  CT Angio Chest Pulmonary Embolism (PE) W or WO Contrast  Addendum Date: 05/25/2021   ADDENDUM REPORT: 05/25/2021 08:31 ADDENDUM: Critical Value/emergent results were called by telephone at the time of interpretation on 05/25/2021 at 8:31 am to provider [facility] , who verbally acknowledged these results. Electronically Signed   By: Bretta Bang III M.D.   On: 05/25/2021 08:31   Addendum Date: 05/25/2021   ADDENDUM REPORT: 05/25/2021 07:49 ADDENDUM: On further review, there is a small peripheral pulmonary embolus in a lateral segment right lower lobe pulmonary artery branch. Electronically Signed   By: Bretta Bang III M.D.   On: 05/25/2021 07:49   Result Date: 05/25/2021 CLINICAL DATA:  Shortness of breath EXAM: CT ANGIOGRAPHY CHEST WITH CONTRAST TECHNIQUE: Multidetector CT imaging of the chest was performed using the standard protocol during bolus administration of intravenous contrast. Multiplanar CT  image reconstructions and MIPs were obtained to evaluate the vascular anatomy. CONTRAST:  80mL OMNIPAQUE IOHEXOL 350 MG/ML SOLN COMPARISON:  CT angiogram chest November 29, 2020 FINDINGS: Cardiovascular: There is no evident pulmonary embolus. Ascending thoracic aortic diameter measures 4.4 x 4.4 cm, stable. No thoracic aortic dissection. Measured diameter at the sinuses of Valsalva measures 4.3 cm. Measured diameter in the aortic arch measures 3.8 cm. Measured diameter of the descending aorta at the main pulmonary outflow tract measures 3.6 x 3.5 cm. Visualized great vessels appear unremarkable. There are occasional foci of aortic atherosclerosis. There is no pericardial effusion or pericardial thickening. Heart is mildly enlarged. Mediastinum/Nodes: There is a 1 cm  nodular opacity in the left lobe of the thyroid, stable. There is a 4 mm nodular opacity in the right lobe of the thyroid. These findings do not warrant additional imaging surveillance per consensus guidelines. No evident thoracic adenopathy. No esophageal lesions are evident. Lungs/Pleura: There are scattered areas of atelectatic change in each lower lobe and in the posterior segment of the left upper lobe. There is no edema or consolidation. No pleural effusions. Fatty prominence extending along the posterior descending aorta is a stable finding, felt to be of benign etiology. Trachea and major bronchial structures appear patent. No pneumothorax. Upper Abdomen: Visualized upper abdominal structures appear unremarkable. Musculoskeletal: No blastic or lytic bone lesions. No evident chest wall lesions. Review of the MIP images confirms the above findings. IMPRESSION: 1.  No demonstrable pulmonary embolus. 2. Ascending thoracic aortic diameter measures 4.4 x 4.4 cm. Measured diameter in the proximal arch measures 3.8 cm. Recommend annual imaging followup by CTA or MRA. This recommendation follows 2010 ACCF/AHA/AATS/ACR/ASA/SCA/SCAI/SIR/STS/SVM Guidelines for the Diagnosis and Management of Patients with Thoracic Aortic Disease. Circulation.2010; 121: Z610-R604. Aortic aneurysm NOS (ICD10-I71.9) no evident thoracic aortic dissection. There are foci of aortic atherosclerosis. There is cardiac enlargement. 3. No evident edema or consolidation. Scattered areas of atelectatic change. No pleural effusions. 4.  No evident adenopathy. Aortic Atherosclerosis (ICD10-I70.0). Electronically Signed: By: Bretta Bang III M.D. On: 05/24/2021 13:35   VAS [place] LOWER EXTREMITY VENOUS (DVT)  Result Date: 05/26/2021  Lower Venous DVT Study Patient Name:  KAEDIN HICKLIN  Date of Exam:   05/26/2021 Medical Rec #: [id number]       Accession #:    [id number] Date of Birth: 1956/06/29        Patient Gender: M Patient Age:   18Y Exam  Location:  [facility] Procedure:      VAS [place] LOWER EXTREMITY VENOUS (DVT) Referring Phys: [id number] ROBERT LOCKWOOD --------------------------------------------------------------------------------  Indications: Pulmonary embolism.  Comparison Study: No prior study Performing Technologist: Gertie Fey MHA, RDMS, RVT, RDCS  Examination Guidelines: A complete evaluation includes B-mode imaging, spectral Doppler, color Doppler, and power Doppler as needed of all accessible portions of each vessel. Bilateral testing is considered an integral part of a complete examination. Limited examinations for reoccurring indications may be performed as noted. The reflux portion of the exam is performed with the patient in reverse Trendelenburg.  +---------+---------------+---------+-----------+----------+--------------+ RIGHT    CompressibilityPhasicitySpontaneityPropertiesThrombus Aging +---------+---------------+---------+-----------+----------+--------------+ CFV      Full           Yes      Yes                                 +---------+---------------+---------+-----------+----------+--------------+ SFJ      Full                                                        +---------+---------------+---------+-----------+----------+--------------+  FV Prox  Full                                                        +---------+---------------+---------+-----------+----------+--------------+ FV Mid   Full                                                        +---------+---------------+---------+-----------+----------+--------------+ FV DistalFull                                                        +---------+---------------+---------+-----------+----------+--------------+ PFV      Full                                                        +---------+---------------+---------+-----------+----------+--------------+ POP      Full           Yes      Yes                                  +---------+---------------+---------+-----------+----------+--------------+ PTV      Full                                                        +---------+---------------+---------+-----------+----------+--------------+ PERO     Full                                                        +---------+---------------+---------+-----------+----------+--------------+   +---------+---------------+---------+-----------+----------+--------------+ LEFT     CompressibilityPhasicitySpontaneityPropertiesThrombus Aging +---------+---------------+---------+-----------+----------+--------------+ CFV      Full           Yes      Yes                                 +---------+---------------+---------+-----------+----------+--------------+ SFJ      Full                                                        +---------+---------------+---------+-----------+----------+--------------+ FV Prox  Full                                                        +---------+---------------+---------+-----------+----------+--------------+  FV Mid   Full                                                        +---------+---------------+---------+-----------+----------+--------------+ FV DistalFull                                                        +---------+---------------+---------+-----------+----------+--------------+ PFV      Full                                                        +---------+---------------+---------+-----------+----------+--------------+ POP      Full           Yes      Yes                                 +---------+---------------+---------+-----------+----------+--------------+ PTV      Full                                                        +---------+---------------+---------+-----------+----------+--------------+ PERO     Full                                                         +---------+---------------+---------+-----------+----------+--------------+     Summary: RIGHT: - There is no evidence of deep vein thrombosis in the lower extremity.  - No cystic structure found in the popliteal fossa.  LEFT: - There is no evidence of deep vein thrombosis in the lower extremity.  - No cystic structure found in the popliteal fossa.  *See table(s) above for measurements and observations.    Preliminary      Assessment and Plan:   1. Dyspnea on exertion/chest pain, possibly concerning for unstable angina - suspect multifactorial, clinical picture made challenging by symptoms possibly suggestive of angina but with high PVC burden on telemetry as well as acute pulmonary embolus as well - await echocardiogram - prior ASA allergy noted with itching so hold off on adding this - continue BB, statin, heparin - per discussion with Dr. Cristal Deer will tentatively arrange left heart cath on Monday - she talked to patient about this. We would avoid a right heart cath at this time given his PE. This is scheduled with Dr. Clifton James at 1:30pm on Monday - cath orders written - if he develops fluid issues over the weekend please adjust pre-cath fluids - we are not convinced that PE is entirely responsible for his symptoms - chart would infer that acute worsening is superimposed on several month history of progression of DOE  2. 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 Small acute pulmonary embolus - likely provoked by several long flights recently - LE venous duplex negative for PE - continue heparin per pharmacy pending completion of procedures  4. Hypokalemia - primary team repleting - got 4 runs IV KCl plus continued home dose of potassium BID with cessation of chlorthalidone  5. Essential HTN - follow in context above - current rx includes diltiazem  daily, Lasix  BID, losartan  daily, hydralazine  TID, nebivolol  nightly - titrate nebivolol as above - chlorthalidone was intended to be discontinued in 01/2021 but appeared back on medicine list this week - agree with discontinuation given need for Lasix to help manage his history of edema - previous renal duplex negative for RAS - would consider spironolactone in the future if needed for HTN management  6. Elevated troponin - low/flat trend, may be due to PE, do not suspect ACS  7. History of lower extremity edema - may reflect some degree of HFpEF - changes made as above  8. OSA - continue CPAP QHS  9. HLD - last LDL 12/2020 was 87 prompting titration of atorvstatin to  daily - check liver/lipids in AM  10. Thoracic aortic aneurysm - 4.4x4.4cm by CT this month, continued annual f/u recommended  Risk Assessment/Risk Scores:     TIMI Risk Score for Unstable Angina or Non-ST Elevation MI:   The patient's TIMI risk score is 3, which indicates a 13% risk of all cause mortality, new or recurrent myocardial infarction or need for urgent revascularization in the next 14 days.   For questions or updates, please contact [facility] Please consult [email or web address] for contact info under    Signed, Laurann Montana, PA-C  05/26/2021 2:41 PM

## 2021-05-26 NOTE — Progress Notes (Addendum)
ANTICOAGULATION CONSULT NOTE  Pharmacy Consult for Heparin Indication: pulmonary embolus  Allergies  Allergen Reactions   Amlodipine Besylate Swelling    Leg swelling with 10mg  daily am dosing; decreased with BID 5mg  dosing   Aspirin Itching    Patient Measurements:   Heparin Dosing Weight: 98.9kg  Vital Signs: Temp: 97.8 F (36.6 C) (06/17 0424) Temp Source: Oral (06/17 0424) BP: 137/78 (06/17 0424) Pulse Rate: 70 (06/17 0424)  Labs: Recent Labs    05/24/21 1038 05/25/21 1020 05/25/21 1223 05/25/21 1818 05/25/21 2019 05/25/21 2200 05/26/21 0312  HGB 11.9* 12.3*  --   --   --   --  13.2  HCT 35.4* 36.4*  --   --   --   --  37.4*  PLT 171 193  --   --   --   --  178  APTT  --  31  --   --   --   --   --   LABPROT  --  13.8  --   --   --   --   --   INR  --  1.1  --   --   --   --   --   HEPARINUNFRC  --   --   --  0.15*  --   --  0.47  CREATININE 1.17 1.21  --   --   --   --  1.13  TROPONINIHS  --  47*   < > 51* 53* 55*  --    < > = values in this interval not displayed.     Estimated Creatinine Clearance: 83.4 mL/min (by C-G formula based on SCr of 1.13 mg/dL).  Assessment: 65 y.o. male with small acute PE 05/25/21. No anticoagulation prior to admission. Pharmacy consulted for heparin.    HL drawn 6 hours after 2nd bolus and rate increase is 0.47, which is therapeutic. Patient received a total of 8900 units of heparin bolus yesterday.  H/H, plt stable. Will increase rate slightly to keep in goal.   Goal of Therapy:  Heparin level 0.3-0.7 units/ml Monitor platelets by anticoagulation protocol: Yes   Plan:  Increase Heparin to 2000 units/hr F/u 6hr confirmatory given high bolus requirement Monitor daily HL, CBC/plt Monitor for signs/symptoms of bleeding  F/U plan for oral anticoagulation  76, PharmD, BCPS, BCCP Clinical Pharmacist  Please check AMION for all [facility] phone numbers After 10:00 PM, call Main Pharmacy [phone]

## 2021-05-26 NOTE — Progress Notes (Signed)
Patient placed on CPAP tonight with 4L bled in. Patient tolerating well.

## 2021-05-26 NOTE — Progress Notes (Signed)
Bilateral lower extremity venous duplex completed. Refer to "CV Proc" under chart review to view preliminary results.  05/26/2021 11:12 AM Eula Fried., MHA, RVT, RDCS, RDMS

## 2021-05-27 ENCOUNTER — Encounter (HOSPITAL_COMMUNITY): Payer: Self-pay | Admitting: Family Medicine

## 2021-05-27 DIAGNOSIS — I5022 Chronic systolic (congestive) heart failure: Secondary | ICD-10-CM

## 2021-05-27 DIAGNOSIS — G4733 Obstructive sleep apnea (adult) (pediatric): Secondary | ICD-10-CM

## 2021-05-27 DIAGNOSIS — I519 Heart disease, unspecified: Secondary | ICD-10-CM

## 2021-05-27 DIAGNOSIS — I77810 Thoracic aortic ectasia: Secondary | ICD-10-CM

## 2021-05-27 DIAGNOSIS — I509 Heart failure, unspecified: Secondary | ICD-10-CM

## 2021-05-27 DIAGNOSIS — I2609 Other pulmonary embolism with acute cor pulmonale: Secondary | ICD-10-CM

## 2021-05-27 DIAGNOSIS — R0600 Dyspnea, unspecified: Secondary | ICD-10-CM | POA: Diagnosis not present

## 2021-05-27 DIAGNOSIS — I110 Hypertensive heart disease with heart failure: Secondary | ICD-10-CM | POA: Diagnosis present

## 2021-05-27 DIAGNOSIS — I2729 Other secondary pulmonary hypertension: Secondary | ICD-10-CM

## 2021-05-27 HISTORY — DX: Chronic systolic (congestive) heart failure: I50.22

## 2021-05-27 HISTORY — DX: Heart failure, unspecified: I50.9

## 2021-05-27 LAB — HEPATIC FUNCTION PANEL
ALT: 24 U/L (ref 0–44)
AST: 20 U/L (ref 15–41)
Albumin: 3 g/dL — ABNORMAL LOW (ref 3.5–5.0)
Alkaline Phosphatase: 63 U/L (ref 38–126)
Bilirubin, Direct: 0.3 mg/dL — ABNORMAL HIGH (ref 0.0–0.2)
Indirect Bilirubin: 0.7 mg/dL (ref 0.3–0.9)
Total Bilirubin: 1 mg/dL (ref 0.3–1.2)
Total Protein: 5.7 g/dL — ABNORMAL LOW (ref 6.5–8.1)

## 2021-05-27 LAB — BASIC METABOLIC PANEL
Anion gap: 9 (ref 5–15)
Anion gap: 9 (ref 5–15)
BUN: 20 mg/dL (ref 8–23)
BUN: 19 mg/dL (ref 8–23)
CO2: 22 mmol/L (ref 22–32)
CO2: 21 mmol/L — ABNORMAL LOW (ref 22–32)
Calcium: 8.7 mg/dL — ABNORMAL LOW (ref 8.9–10.3)
Calcium: 8.7 mg/dL — ABNORMAL LOW (ref 8.9–10.3)
Chloride: 103 mmol/L (ref 98–111)
Chloride: 103 mmol/L (ref 98–111)
Creatinine, Ser: 1.41 mg/dL — ABNORMAL HIGH (ref 0.61–1.24)
Creatinine, Ser: 1.35 mg/dL — ABNORMAL HIGH (ref 0.61–1.24)
GFR, Estimated: 55 mL/min — ABNORMAL LOW (ref >60)
GFR, Estimated: 58 mL/min — ABNORMAL LOW (ref >60)
Glucose, Bld: 102 mg/dL — ABNORMAL HIGH (ref 70–99)
Glucose, Bld: 120 mg/dL — ABNORMAL HIGH (ref 70–99)
Potassium: 3 mmol/L — ABNORMAL LOW (ref 3.5–5.1)
Potassium: 4 mmol/L (ref 3.5–5.1)
Sodium: 134 mmol/L — ABNORMAL LOW (ref 135–145)
Sodium: 133 mmol/L — ABNORMAL LOW (ref 135–145)

## 2021-05-27 LAB — LIPID PANEL
Cholesterol: 123 mg/dL (ref 0–200)
HDL: 49 mg/dL (ref >40)
LDL Cholesterol: 61 mg/dL (ref 0–99)
Total CHOL/HDL Ratio: 2.5 RATIO
Triglycerides: 67 mg/dL (ref <150)
VLDL: 13 mg/dL (ref 0–40)

## 2021-05-27 LAB — HEPARIN LEVEL (UNFRACTIONATED)
Heparin Unfractionated: 0.13 IU/mL — ABNORMAL LOW (ref 0.30–0.70)
Heparin Unfractionated: 0.35 IU/mL (ref 0.30–0.70)
Heparin Unfractionated: 0.47 IU/mL (ref 0.30–0.70)

## 2021-05-27 LAB — CBC
HCT: 36.5 % — ABNORMAL LOW (ref 39.0–52.0)
Hemoglobin: 12.4 g/dL — ABNORMAL LOW (ref 13.0–17.0)
MCH: 33.4 pg (ref 26.0–34.0)
MCHC: 34 g/dL (ref 30.0–36.0)
MCV: 98.4 fL (ref 80.0–100.0)
Platelets: 187 10*3/uL (ref 150–400)
RBC: 3.71 MIL/uL — ABNORMAL LOW (ref 4.22–5.81)
RDW: 15.3 % (ref 11.5–15.5)
WBC: 5.1 10*3/uL (ref 4.0–10.5)
nRBC: 0 % (ref 0.0–0.2)

## 2021-05-27 LAB — MAGNESIUM: Magnesium: 2.1 mg/dL (ref 1.7–2.4)

## 2021-05-27 MED ORDER — POTASSIUM CHLORIDE CRYS ER 20 MEQ PO TBCR
40.0000 meq | EXTENDED_RELEASE_TABLET | Freq: Two times a day (BID) | ORAL | Status: DC
  Administered 2021-05-27 – 2021-05-28 (×2): 40 meq via ORAL
  Filled 2021-05-27 (×2): qty 2

## 2021-05-27 MED ORDER — HEPARIN BOLUS VIA INFUSION
3000.0000 [IU] | Freq: Once | INTRAVENOUS | Status: AC
  Administered 2021-05-27: 3000 [IU] via INTRAVENOUS
  Filled 2021-05-27: qty 3000

## 2021-05-27 MED ORDER — POTASSIUM CHLORIDE 10 MEQ/100ML IV SOLN
10.0000 meq | INTRAVENOUS | Status: AC
  Administered 2021-05-27 (×4): 10 meq via INTRAVENOUS
  Filled 2021-05-27 (×4): qty 100

## 2021-05-27 MED ORDER — SPIRONOLACTONE 12.5 MG HALF TABLET
12.5000 mg | ORAL_TABLET | Freq: Every day | ORAL | Status: DC
  Administered 2021-05-27 – 2021-05-30 (×4): 12.5 mg via ORAL
  Filled 2021-05-27 (×4): qty 1

## 2021-05-27 MED ORDER — FUROSEMIDE 10 MG/ML IJ SOLN
40.0000 mg | Freq: Two times a day (BID) | INTRAMUSCULAR | Status: AC
  Administered 2021-05-27 – 2021-05-28 (×3): 40 mg via INTRAVENOUS
  Filled 2021-05-27 (×3): qty 4

## 2021-05-27 MED ORDER — POTASSIUM CHLORIDE 20 MEQ PO PACK
20.0000 meq | PACK | Freq: Three times a day (TID) | ORAL | Status: DC
  Administered 2021-05-28 – 2021-05-30 (×6): 20 meq via ORAL
  Filled 2021-05-27 (×7): qty 1

## 2021-05-27 NOTE — Progress Notes (Signed)
ANTICOAGULATION CONSULT NOTE  Pharmacy Consult for Heparin Indication: pulmonary embolus  Allergies  Allergen Reactions   Amlodipine Besylate Swelling    Leg swelling with 10mg  daily am dosing; decreased with BID 5mg  dosing   Aspirin Itching    Patient Measurements:   Heparin Dosing Weight: 98.9kg  Vital Signs: Temp: 98.1 F (36.7 C) (06/18 1615) Temp Source: Oral (06/18 1615) BP: 115/65 (06/18 1615) Pulse Rate: 30 (06/18 1615)  Labs: Recent Labs    05/25/21 1020 05/25/21 1223 05/25/21 1818 05/25/21 2019 05/25/21 2200 05/26/21 0312 05/26/21 1625 05/26/21 1924 05/27/21 0138 05/27/21 0948 05/27/21 1758  HGB 12.3*  --   --   --   --  13.2  --   --  12.4*  --   --   HCT 36.4*  --   --   --   --  37.4*  --   --  36.5*  --   --   PLT 193  --   --   --   --  178  --   --  187  --   --   APTT 31  --   --   --   --   --   --   --   --   --   --   LABPROT 13.8  --   --   --   --   --   --   --   --   --   --   INR 1.1  --   --   --   --   --   --   --   --   --   --   HEPARINUNFRC  --    < > 0.15*  --   --  0.47   < >  --  0.13* 0.35 0.47  CREATININE 1.21  --   --   --   --  1.13  --  1.68* 1.41*  --   --   TROPONINIHS 47*   < > 51* 53* 55*  --   --   --   --   --   --    < > = values in this interval not displayed.     Estimated Creatinine Clearance: 66.9 mL/min (A) (by C-G formula based on SCr of 1.41 mg/dL (H)).  Assessment: 65 y.o. male with PE, awaiting cath Monday, for heparin.  Heparin drip 2350 units/hr  and HL therapeutic at 0.47. Hgb stable around 12, PLT 170-190 stable   Goal of Therapy:  Heparin level 0.3-0.7 units/ml Monitor platelets by anticoagulation protocol: Yes   Plan:  Continue heparin 2350 units/hr Daily cbc,heparin level  monitor for signs of bleeding   76 Pharm.D. CPP, BCPS Clinical Pharmacist [phone] 05/27/2021 7:24 PM

## 2021-05-27 NOTE — Progress Notes (Signed)
Family Medicine Teaching Service Daily Progress Note Intern Pager: [phone]  Patient name: [name] Medical record number: [id number] Date of birth: 1956-09-26 Age: 65 y.o. Gender: male  Primary Care Provider: [name], MD Consultants: Cards, Pharmacy Code Status: Full  Pt Overview and Major Events to Date:  6/16: admitted   Assessment and Plan: DARIEN KADING is a 65 year old male who presented with chest pain and dyspnea and was found to have acute PE. PMH significant for PVCs s/p ablation (2019), dilated aortic root, HTN, HLD, OSA, rheumatoid arthritis, and gout.  Chest Pain  PE  PVC's Elevated T and cough likely due to PE.  LDL-61.  ECHO shows EF 45-50%. There is severe asymmetric left ventricular hypertrophy, no wall motion abnormalities. Left ventricular diastolic parameters are consistent with Grade I diastolic dysfunction.   Right ventricular systolic function is moderately reduced. The right ventricular size is moderately enlarged. There is moderately elevated pulmonary artery systolic pressure.Aortic Dilation stable.  Cards following.  Plan for Cath on 6/20.  - Cards following, appreciate recs  - Increase to IV Lasix 40 mg BID - Continue Heparin - Repeat BNP tomorrow  Hypokalemia K-3.0 this AM.  Magnesium 2.1.  PVC's have decreased on monitor.  Likely due to agressively Lasix diuresis    - Replete IV K 40 mEq - Consider obtaining repeat this afternoon - AM BMP, Mg - Add on Spironolactone  AKI Cr- 1.13>1.68>1.41.  Possibly due to heavy diuresis. - AM BMP  FEN/GI: Heart healthy diet PPx: Heparin   Status is: Inpatient  Remains inpatient appropriate because:Inpatient level of care appropriate due to severity of illness  Dispo: The patient is from: Home              Anticipated d/c is to: Home              Patient currently is not medically stable to d/c.   Difficult to place patient No   Subjective:  Patient indicates has worsening cough and  feeling very warm.  Is still feeling dyspneic.  Indicates is urinating frequently.   Objective: Temp:  [97.6 F (36.4 C)-99.4 F (37.4 C)] 98.9 F (37.2 C) (06/18 0526) Pulse Rate:  [60-71] 66 (06/18 0526) Resp:  [18-20] 20 (06/18 0526) BP: (112-146)/(71-97) 146/97 (06/18 0526) SpO2:  [92 %-98 %] 98 % (06/18 0526) Physical Exam:  Physical Exam Constitutional:      General: He is not in acute distress. HENT:     Head: Normocephalic and atraumatic.     Mouth/Throat:     Mouth: Mucous membranes are moist.  Cardiovascular:     Rate and Rhythm: Normal rate and regular rhythm.     Pulses: Normal pulses.  Pulmonary:     Breath sounds: Normal breath sounds.     Comments: Increased respiratory effort Abdominal:     General: Abdomen is flat.     Palpations: Abdomen is soft.  Skin:    General: Skin is warm.  Neurological:     Mental Status: He is alert.     Laboratory: Recent Labs  Lab 05/25/21 1020 05/26/21 0312 05/27/21 0138  WBC 5.5 5.2 5.1  HGB 12.3* 13.2 12.4*  HCT 36.4* 37.4* 36.5*  PLT 193 178 187   Recent Labs  Lab 05/26/21 0312 05/26/21 1924 05/27/21 0138  NA 135 134* 134*  K 3.1* 2.9* 3.0*  CL 100 100 103  CO2 25 24 22   BUN 14 20 20   CREATININE 1.13 1.68* 1.41*  CALCIUM 9.1 8.8* 8.7*  PROT  --   --  5.7*  BILITOT  --   --  1.0  ALKPHOS  --   --  63  ALT  --   --  24  AST  --   --  20  GLUCOSE 116* 135* 102*    Mg- 2.1  Imaging/Diagnostic Tests: No new imaging  Jovita Kussmaul, MD 05/27/2021, 8:29 AM PGY-1, [facility] FPTS Intern pager: [phone], text pages welcome

## 2021-05-27 NOTE — Progress Notes (Signed)
DAILY PROGRESS NOTE   Patient Name: Ryan Glass Date of Encounter: 05/27/2021 Cardiologist: [name], MD  Chief Complaint   No complaints  Patient Profile   Ryan Glass is a 65 y.o. 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 Clayborne Artist.  Subjective   No issues overnight - troponin has been flat around 50-55 x 5 values. Potassium remains low - has had PVC's. Small subsegmental PE noted prior to admission. Creatinine improved to 1.41 today (from 1.68) - plan for possible cath on Monday. Lipids are good. C/o feeling hot - but no documented fever, Tmax 100F. Echo yesterday shows LVEF lower at 45-50%, apparently new moderate RV dysfunction and moderate pulmonary hypertension with RVSP of 50 mmHg.  Objective   Vitals:   05/27/21 0526 05/27/21 0832 05/27/21 0848 05/27/21 0948  BP: (!) 146/97  (!) 145/86   Pulse: 66  74   Resp: 20  20   Temp: 98.9 F (37.2 C)  100 F (37.8 C) 99.4 F (37.4 C)  TempSrc: Oral  Oral Oral  SpO2: 98% 94% 95%     Intake/Output Summary (Last 24 hours) at 05/27/2021 1046 Last data filed at 05/27/2021 0848 Gross per 24 hour  Intake 790.08 ml  Output 600 ml  Net 190.08 ml   There were no vitals filed for this visit.  Physical Exam   General appearance: alert and no distress Neck: no carotid bruit, no JVD, and thyroid not enlarged, symmetric, no tenderness/mass/nodules Lungs: diminished breath sounds bibasilar Heart: regular rate and rhythm and occasional extra beats Abdomen: soft, non-tender; bowel sounds normal; no masses,  no organomegaly Extremities: extremities normal, atraumatic, no cyanosis or edema Pulses:  2+ and symmetric Skin: Skin color, texture, turgor normal. No rashes or lesions Neurologic: Grossly normal Psych: Pleasant  Inpatient Medications    Scheduled Meds:  atorvastatin  40 mg Oral Daily   diltiazem  180 mg Oral Daily   fluticasone  1 spray Each Nare BID   fluticasone furoate-vilanterol  1 puff Inhalation Daily   folic acid  1 mg Oral Daily   furosemide  40 mg Oral BID   gabapentin  600 mg Oral TID   hydrALAZINE  100 mg Oral TID   losartan  100 mg Oral Daily   magnesium oxide  400 mg Oral Daily   nebivolol  10 mg Oral QHS   potassium chloride SA  40 mEq Oral BID   predniSONE  10 mg Oral Daily   sodium chloride flush  3 mL Intravenous Q12H    Continuous Infusions:  heparin 2,350 Units/hr (05/27/21 0448)   potassium chloride      PRN Meds: acetaminophen, albuterol, benzonatate   Labs   Results for orders placed or performed during the hospital encounter of 05/25/21 (from the past 48 hour(s))  SARS CORONAVIRUS 2 (TAT 6-24 HRS) Nasopharyngeal Nasopharyngeal Swab     Status: None   Collection Time: 05/25/21 10:52 AM   Specimen: Nasopharyngeal Swab  Result Value Ref Range   SARS Coronavirus 2 NEGATIVE NEGATIVE    Comment: (NOTE) SARS-CoV-2 target nucleic acids are NOT DETECTED.  The SARS-CoV-2 RNA is generally detectable in upper and lower respiratory  specimens during the acute phase of infection. Negative results do not preclude SARS-CoV-2 infection, do not rule out co-infections with other pathogens, and should not be used as the sole basis for treatment or other patient management decisions. Negative results must be combined with clinical observations, patient history, and epidemiological information. The expected result is Negative.  Fact Sheet for Patients: HairSlick.no  Fact Sheet for Healthcare Providers: quierodirigir.com  This test is not yet approved or cleared by the [place] FDA and   has been authorized for detection and/or diagnosis of SARS-CoV-2 by FDA under an Emergency Use Authorization (EUA). This EUA will remain  in effect (meaning this test can be used) for the duration of the COVID-19 declaration under Se ction 564(b)(1) of the Act, 21 U.S.C. section 360bbb-3(b)(1), unless the authorization is terminated or revoked sooner.  Performed at [facility], [address] [place], [place] [address]   Troponin I (High Sensitivity)     Status: Abnormal   Collection Time: 05/25/21 12:23 PM  Result Value Ref Range   Troponin I (High Sensitivity) 55 (H) <18 ng/L    Comment: (NOTE) Elevated high sensitivity troponin I (hsTnI) values and significant  changes across serial measurements may suggest ACS but many other  chronic and acute conditions are known to elevate hsTnI results.  Refer to the "Links" section for chest pain algorithms and additional  guidance. Performed at [facility], [address] [place], [place] [address]   Heparin level (unfractionated)     Status: Abnormal   Collection Time: 05/25/21  6:18 PM  Result Value Ref Range   Heparin Unfractionated 0.15 (L) 0.30 - 0.70 IU/mL    Comment: (NOTE) The clinical reportable range upper limit is being lowered to >1.10 to align with the FDA approved guidance for the current laboratory assay.  If heparin results are below expected values, and patient dosage has  been confirmed, suggest follow up testing of antithrombin III levels. Performed at [facility], [address] [place], [place] [address]   HIV Antibody (routine testing w rflx)     Status: None   Collection Time: 05/25/21  6:18 PM  Result Value Ref Range   HIV Screen 4th Generation wRfx Non Reactive Non Reactive    Comment: Performed at [facility], [address], [place], [place] [address]  Troponin I (High Sensitivity)     Status: Abnormal   Collection Time: 05/25/21  6:18 PM  Result Value Ref Range    Troponin I (High Sensitivity) 51 (H) <18 ng/L    Comment: (NOTE) Elevated high sensitivity troponin I (hsTnI) values and significant  changes across serial measurements may suggest ACS but many other  chronic and acute conditions are known to elevate hsTnI results.  Refer to the "Links" section for chest pain algorithms and additional  guidance. Performed at [facility], [address], [place], [place] [address]   Troponin I (High Sensitivity)     Status: Abnormal   Collection Time: 05/25/21  8:19 PM  Result Value Ref Range   Troponin I (High Sensitivity) 53 (H) <18 ng/L    Comment: (NOTE) Elevated high sensitivity troponin I (hsTnI) values and significant  changes across serial measurements may suggest ACS but many other  chronic and acute conditions are known to elevate hsTnI results.  Refer to the "Links" section for chest pain algorithms and additional  guidance. Performed at [facility], [address] [place],  [place] [address]   Troponin I (High Sensitivity)     Status: Abnormal   Collection Time: 05/25/21 10:00 PM  Result Value Ref Range   Troponin I (High Sensitivity) 55 (H) <18 ng/L    Comment: (NOTE) Elevated high sensitivity troponin I (hsTnI) values and significant  changes across serial measurements may suggest ACS but many other  chronic and acute conditions are known to elevate hsTnI results.  Refer to the "Links" section for chest pain algorithms and additional  guidance. Performed at [facility], [address] [place], [place] [address]   Basic metabolic panel     Status: Abnormal   Collection Time: 05/26/21  3:12 AM  Result Value Ref Range   Sodium 135 135 - 145 mmol/L   Potassium 3.1 (L) 3.5 - 5.1 mmol/L   Chloride 100 98 - 111 mmol/L   CO2 25 22 - 32 mmol/L   Glucose, Bld 116 (H) 70 - 99 mg/dL    Comment: Glucose reference range applies only to samples taken after fasting for at least 8 hours.   BUN 14 8 - 23 mg/dL    Creatinine, Ser 4.78 0.61 - 1.24 mg/dL   Calcium 9.1 8.9 - 29.5 mg/dL   GFR, Estimated >62 >13 mL/min    Comment: (NOTE) Calculated using the CKD-EPI Creatinine Equation (2021)    Anion gap 10 5 - 15    Comment: Performed at [facility], [address], [place], [place] [address]  Magnesium     Status: None   Collection Time: 05/26/21  3:12 AM  Result Value Ref Range   Magnesium 1.9 1.7 - 2.4 mg/dL    Comment: Performed at [facility], [address] [place], [place] [address]  Heparin level (unfractionated)     Status: None   Collection Time: 05/26/21  3:12 AM  Result Value Ref Range   Heparin Unfractionated 0.47 0.30 - 0.70 IU/mL    Comment: (NOTE) The clinical reportable range upper limit is being lowered to >1.10 to align with the FDA approved guidance for the current laboratory assay.  If heparin results are below expected values, and patient dosage has  been confirmed, suggest follow up testing of antithrombin III levels. Performed at [facility], [address], [place], [place] [address]   CBC     Status: Abnormal   Collection Time: 05/26/21  3:12 AM  Result Value Ref Range   WBC 5.2 4.0 - 10.5 K/uL   RBC 3.87 (L) 4.22 - 5.81 MIL/uL   Hemoglobin 13.2 13.0 - 17.0 g/dL   HCT 41.3 (L) 24.4 - 01.0 %   MCV 96.6 80.0 - 100.0 fL   MCH 34.1 (H) 26.0 - 34.0 pg   MCHC 35.3 30.0 - 36.0 g/dL   RDW 27.2 53.6 - 64.4 %   Platelets 178 150 - 400 K/uL   nRBC 0.0 0.0 - 0.2 %    Comment: Performed at [facility], [address], [place], [place] [address]  Heparin level (unfractionated)     Status: Abnormal   Collection Time: 05/26/21  4:25 PM  Result Value Ref Range   Heparin Unfractionated 0.24 (L) 0.30 - 0.70 IU/mL    Comment: (NOTE) The clinical reportable range upper limit is being lowered to >1.10 to align with the FDA approved guidance for the current laboratory assay.  If heparin results are below expected values, and patient dosage has   been confirmed, suggest follow up  testing of antithrombin III levels. Performed at [facility], [address], [place], [place] [address]   TSH     Status: None   Collection Time: 05/26/21  4:25 PM  Result Value Ref Range   TSH 0.559 0.350 - 4.500 uIU/mL    Comment: Performed by a 3rd Generation assay with a functional sensitivity of <=0.01 uIU/mL. Performed at [facility], [address] [place], [place] [address]   Basic metabolic panel     Status: Abnormal   Collection Time: 05/26/21  7:24 PM  Result Value Ref Range   Sodium 134 (L) 135 - 145 mmol/L   Potassium 2.9 (L) 3.5 - 5.1 mmol/L   Chloride 100 98 - 111 mmol/L   CO2 24 22 - 32 mmol/L   Glucose, Bld 135 (H) 70 - 99 mg/dL    Comment: Glucose reference range applies only to samples taken after fasting for at least 8 hours.   BUN 20 8 - 23 mg/dL   Creatinine, Ser 0.98 (H) 0.61 - 1.24 mg/dL   Calcium 8.8 (L) 8.9 - 10.3 mg/dL   GFR, Estimated 45 (L) >60 mL/min    Comment: (NOTE) Calculated using the CKD-EPI Creatinine Equation (2021)    Anion gap 10 5 - 15    Comment: Performed at [facility], [address], [place], [place] [address]  CBC     Status: Abnormal   Collection Time: 05/27/21  1:38 AM  Result Value Ref Range   WBC 5.1 4.0 - 10.5 K/uL   RBC 3.71 (L) 4.22 - 5.81 MIL/uL   Hemoglobin 12.4 (L) 13.0 - 17.0 g/dL   HCT 78.2 (L) 95.6 - 21.3 %   MCV 98.4 80.0 - 100.0 fL   MCH 33.4 26.0 - 34.0 pg   MCHC 34.0 30.0 - 36.0 g/dL   RDW 08.6 57.8 - 46.9 %   Platelets 187 150 - 400 K/uL   nRBC 0.0 0.0 - 0.2 %    Comment: Performed at [facility], [address], [place], [place] [address]  Heparin level (unfractionated)     Status: Abnormal   Collection Time: 05/27/21  1:38 AM  Result Value Ref Range   Heparin Unfractionated 0.13 (L) 0.30 - 0.70 IU/mL    Comment: (NOTE) The clinical reportable range upper limit is being lowered to >1.10 to align with the FDA approved guidance for the  current laboratory assay.  If heparin results are below expected values, and patient dosage has  been confirmed, suggest follow up testing of antithrombin III levels. Performed at [facility], [address], [place], [place] [address]   Magnesium     Status: None   Collection Time: 05/27/21  1:38 AM  Result Value Ref Range   Magnesium 2.1 1.7 - 2.4 mg/dL    Comment: Performed at [facility], [address] [place], [place] [address]  Basic metabolic panel     Status: Abnormal   Collection Time: 05/27/21  1:38 AM  Result Value Ref Range   Sodium 134 (L) 135 - 145 mmol/L   Potassium 3.0 (L) 3.5 - 5.1 mmol/L   Chloride 103 98 - 111 mmol/L   CO2 22 22 - 32 mmol/L   Glucose, Bld 102 (H) 70 - 99 mg/dL    Comment: Glucose reference range applies only to samples taken after fasting for at least 8 hours.   BUN 20 8 - 23 mg/dL  Creatinine, Ser 1.41 (H) 0.61 - 1.24 mg/dL   Calcium 8.7 (L) 8.9 - 10.3 mg/dL   GFR, Estimated 55 (L) >60 mL/min    Comment: (NOTE) Calculated using the CKD-EPI Creatinine Equation (2021)    Anion gap 9 5 - 15    Comment: Performed at [facility], [address] [place], [place] [address]  Lipid panel     Status: None   Collection Time: 05/27/21  1:38 AM  Result Value Ref Range   Cholesterol 123 0 - 200 mg/dL   Triglycerides 67 <604 mg/dL   HDL 49 >54 mg/dL   Total CHOL/HDL Ratio 2.5 RATIO   VLDL 13 0 - 40 mg/dL   LDL Cholesterol 61 0 - 99 mg/dL    Comment:        Total Cholesterol/HDL:CHD Risk Coronary Heart Disease Risk Table                     Men   Women  1/2 Average Risk   3.4   3.3  Average Risk       5.0   4.4  2 X Average Risk   9.6   7.1  3 X Average Risk  23.4   11.0        Use the calculated Patient Ratio above and the CHD Risk Table to determine the patient's CHD Risk.        ATP III CLASSIFICATION (LDL):  <100     mg/dL   Optimal  098-119  mg/dL   Near or Above                    Optimal  130-159  mg/dL    Borderline  147-829  mg/dL   High  >562     mg/dL   Very High Performed at [facility], [address] [place], [place] [address]   Hepatic function panel     Status: Abnormal   Collection Time: 05/27/21  1:38 AM  Result Value Ref Range   Total Protein 5.7 (L) 6.5 - 8.1 g/dL   Albumin 3.0 (L) 3.5 - 5.0 g/dL   AST 20 15 - 41 U/L   ALT 24 0 - 44 U/L   Alkaline Phosphatase 63 38 - 126 U/L   Total Bilirubin 1.0 0.3 - 1.2 mg/dL   Bilirubin, Direct 0.3 (H) 0.0 - 0.2 mg/dL   Indirect Bilirubin 0.7 0.3 - 0.9 mg/dL    Comment: Performed at [facility], [address] [place], [place] [address]    ECG   N/A  Telemetry   Sinus with PVC's - Personally Reviewed  Radiology    ECHOCARDIOGRAM COMPLETE  Result Date: 05/26/2021    ECHOCARDIOGRAM REPORT   Patient Name:   Ryan Glass Date of Exam: 05/26/2021 Medical Rec #:  [id number]      Height:       70.0 in Accession #:    [id number]     Weight:       257.4 lb Date of Birth:  1956-05-10       BSA:          2.324 m Patient Age:    65 years       BP:           133/81 mmHg Patient Gender: M              HR:  77 bpm. Exam Location:  Inpatient Procedure: 2D Echo, Cardiac Doppler and Color Doppler Indications:    Pulmonary embolus  History:        Patient has prior history of Echocardiogram examinations, most                 recent 02/08/2021.  Sonographer:    Neomia Dear RDCS Referring Phys: [id number] TODD D MCDIARMID IMPRESSIONS  1. Left ventricular ejection fraction, by estimation, is 45 to 50%. The left ventricle has mildly decreased function. The left ventricle has no regional wall motion abnormalities. There is severe asymmetric left ventricular hypertrophy. Left ventricular  diastolic parameters are consistent with Grade I diastolic dysfunction (impaired relaxation).  2. Right ventricular systolic function is moderately reduced. The right ventricular size is moderately enlarged. There is moderately elevated pulmonary artery  systolic pressure.  3. Left atrial size was moderately dilated.  4. Right atrial size was mildly dilated.  5. The mitral valve is normal in structure. Mild mitral valve regurgitation.  6. The aortic valve is normal in structure. Aortic valve regurgitation is not visualized. No aortic stenosis is present.  7. Aortic dilatation noted. There is mild dilatation of the ascending aorta, measuring 41 mm. FINDINGS  Left Ventricle: Left ventricular ejection fraction, by estimation, is 45 to 50%. The left ventricle has mildly decreased function. The left ventricle has no regional wall motion abnormalities. The left ventricular internal cavity size was normal in size. There is severe asymmetric left ventricular hypertrophy. Left ventricular diastolic parameters are consistent with Grade I diastolic dysfunction (impaired relaxation). Right Ventricle: The right ventricular size is moderately enlarged. Right vetricular wall thickness was not well visualized. Right ventricular systolic function is moderately reduced. There is moderately elevated pulmonary artery systolic pressure. The tricuspid regurgitant velocity is 3.27 m/s, and with an assumed right atrial pressure of 8 mmHg, the estimated right ventricular systolic pressure is 50.8 mmHg. Left Atrium: Left atrial size was moderately dilated. Right Atrium: Right atrial size was mildly dilated. Pericardium: There is no evidence of pericardial effusion. Mitral Valve: The mitral valve is normal in structure. Mild mitral valve regurgitation. Tricuspid Valve: The tricuspid valve is grossly normal. Tricuspid valve regurgitation is mild. Aortic Valve: The aortic valve is normal in structure. Aortic valve regurgitation is not visualized. No aortic stenosis is present. Aortic valve mean gradient measures 7.0 mmHg. Aortic valve peak gradient measures 13.1 mmHg. Aortic valve area, by VTI measures 4.24 cm. Pulmonic Valve: The pulmonic valve was normal in structure. Pulmonic valve  regurgitation is not visualized. Aorta: Aortic dilatation noted. There is mild dilatation of the ascending aorta, measuring 41 mm. IAS/Shunts: The atrial septum is grossly normal.  LEFT VENTRICLE PLAX 2D LVIDd:         6.20 cm      Diastology LVIDs:         4.60 cm      LV e' medial:  3.68 cm/s LV PW:         1.10 cm      LV e' lateral: 7.34 cm/s LV IVS:        1.70 cm LVOT diam:     2.90 cm LV SV:         141 LV SV Index:   61 LVOT Area:     6.61 cm  LV Volumes (MOD) LV vol d, MOD A2C: 100.0 ml LV vol d, MOD A4C: 142.0 ml LV vol s, MOD A2C: 46.0 ml LV vol s, MOD A4C: 80.6 ml LV  SV MOD A2C:     54.0 ml LV SV MOD A4C:     142.0 ml LV SV MOD BP:      64.9 ml RIGHT VENTRICLE RV Basal diam:  6.10 cm RV Mid diam:    4.60 cm RV S prime:     20.00 cm/s TAPSE (M-mode): 4.3 cm LEFT ATRIUM              Index LA diam:        4.60 cm  1.98 cm/m LA Vol (A2C):   105.0 ml 45.19 ml/m LA Vol (A4C):   85.0 ml  36.58 ml/m LA Biplane Vol: 101.0 ml 43.47 ml/m  AORTIC VALVE AV Area (Vmax):    3.94 cm AV Area (Vmean):   4.47 cm AV Area (VTI):     4.24 cm AV Vmax:           181.00 cm/s AV Vmean:          118.000 cm/s AV VTI:            0.332 m AV Peak Grad:      13.1 mmHg AV Mean Grad:      7.0 mmHg LVOT Vmax:         108.00 cm/s LVOT Vmean:        79.900 cm/s LVOT VTI:          0.213 m LVOT/AV VTI ratio: 0.64  AORTA Ao Root diam: 4.80 cm Ao Asc diam:  4.10 cm MITRAL VALVE               TRICUSPID VALVE MV Area (PHT): 3.27 cm    TR Peak grad:   42.8 mmHg MV Decel Time: 232 msec    TR Vmax:        327.00 cm/s MV A velocity: 90.70 cm/s                            SHUNTS                            Systemic VTI:  0.21 m                            Systemic Diam: 2.90 cm Kristeen Miss MD Electronically signed by Kristeen Miss MD Signature Date/Time: 05/26/2021/5:04:48 PM    Final    VAS [place] LOWER EXTREMITY VENOUS (DVT)  Result Date: 05/26/2021  Lower Venous DVT Study Patient Name:  Ryan Glass  Date of Exam:   05/26/2021 Medical Rec  #: [id number]       Accession #:    [id number] Date of Birth: 1956/03/23        Patient Gender: M Patient Age:   46Y Exam Location:  [facility] Procedure:      VAS [place] LOWER EXTREMITY VENOUS (DVT) Referring Phys: [id number] ROBERT LOCKWOOD --------------------------------------------------------------------------------  Indications: Pulmonary embolism.  Comparison Study: No prior study Performing Technologist: Gertie Fey MHA, RDMS, RVT, RDCS  Examination Guidelines: A complete evaluation includes B-mode imaging, spectral Doppler, color Doppler, and power Doppler as needed of all accessible portions of each vessel. Bilateral testing is considered an integral part of a complete examination. Limited examinations for reoccurring indications may be performed as noted. The reflux portion of the exam is performed with the patient in reverse Trendelenburg.  +---------+---------------+---------+-----------+----------+--------------+ RIGHT    CompressibilityPhasicitySpontaneityPropertiesThrombus Aging +---------+---------------+---------+-----------+----------+--------------+  CFV      Full           Yes      Yes                                 +---------+---------------+---------+-----------+----------+--------------+ SFJ      Full                                                        +---------+---------------+---------+-----------+----------+--------------+ FV Prox  Full                                                        +---------+---------------+---------+-----------+----------+--------------+ FV Mid   Full                                                        +---------+---------------+---------+-----------+----------+--------------+ FV DistalFull                                                        +---------+---------------+---------+-----------+----------+--------------+ PFV      Full                                                         +---------+---------------+---------+-----------+----------+--------------+ POP      Full           Yes      Yes                                 +---------+---------------+---------+-----------+----------+--------------+ PTV      Full                                                        +---------+---------------+---------+-----------+----------+--------------+ PERO     Full                                                        +---------+---------------+---------+-----------+----------+--------------+   +---------+---------------+---------+-----------+----------+--------------+ LEFT     CompressibilityPhasicitySpontaneityPropertiesThrombus Aging +---------+---------------+---------+-----------+----------+--------------+ CFV      Full           Yes      Yes                                 +---------+---------------+---------+-----------+----------+--------------+  SFJ      Full                                                        +---------+---------------+---------+-----------+----------+--------------+ FV Prox  Full                                                        +---------+---------------+---------+-----------+----------+--------------+ FV Mid   Full                                                        +---------+---------------+---------+-----------+----------+--------------+ FV DistalFull                                                        +---------+---------------+---------+-----------+----------+--------------+ PFV      Full                                                        +---------+---------------+---------+-----------+----------+--------------+ POP      Full           Yes      Yes                                 +---------+---------------+---------+-----------+----------+--------------+ PTV      Full                                                         +---------+---------------+---------+-----------+----------+--------------+ PERO     Full                                                        +---------+---------------+---------+-----------+----------+--------------+     Summary: RIGHT: - There is no evidence of deep vein thrombosis in the lower extremity.  - No cystic structure found in the popliteal fossa.  LEFT: - There is no evidence of deep vein thrombosis in the lower extremity.  - No cystic structure found in the popliteal fossa.  *See table(s) above for measurements and observations. Electronically signed by Coral Else MD on 05/26/2021 at 10:23:44 PM.    Final     Cardiac Studies   See echo above  Assessment   Principal Problem:   Pulmonary embolism [facility]) Active Problems:   Essential hypertension   Sleep apnea   Dilated  aortic root (HCC)   Dyspnea on exertion   Class 2 severe obesity with serious comorbidity and body mass index (BMI) of 35.0 to 35.9 in adult [facility])   Plan   Pulmonary embolism -He is reporting worsening dyspnea on exertion.  Echo yesterday showed LVEF 45 to 50% with global hypokinesis.  There is significant right heart enlargement with RV systolic dysfunction and moderate pulmonary hypertension with RVSP of 51 mmHg.  A prior echo in March 2022 showed an RVSP of 10.8 mmHg, RV systolic function was normal at that time, suggesting that there may be acute RV strain -this seems, however out of proportion to the findings of small peripheral pulmonary embolus of the right lower lobe segmental pulmonary artery branch.  He is on heparin.  Troponins have been flat elevated. There may be acute right heart failure - he is on oral lasix 40 mg BID - check BNP in am. Will switch to IV lasix short term given new RV systolic dysfunction. Frequent PVC's -This could be related cardiomyopathy or related to low potassium which is persistent.  Replete potassium today.  Magnesium is adequate 2.1. May need more aggressive  electrolyte monitoring in the face of diuresis.  Plan for left heart cath on Monday to evaluate for coronary disease. Essential hypertension -Blood pressure remains elevated, will add low-dose spironolactone 12.5 mg daily for additional benefit. OSA -Continue CPAP nightly with oxygen therapy Dilated aortic root -Noted to be as large as 4.4 cm however measured 4.1 cm on echo, continue to monitor  Time Spent Directly with Patient:  I have spent a total of 35 minutes with the patient reviewing hospital notes, telemetry, EKGs, labs and examining the patient as well as establishing an assessment and plan that was discussed personally with the patient.  > 50% of time was spent in direct patient care.  Length of Stay:  LOS: 2 days   Chrystie Nose, MD, [facility], FACP  [place]  [facility]  Medical Director of the [facility] Lipid Disorders &  Cardiovascular Risk Reduction Clinic Diplomate of the American Board of Clinical Lipidology Attending Cardiologist  Direct Dial: [phone]  Fax: [phone]  Website:  [email or web address] 05/27/2021, 10:46 AM

## 2021-05-27 NOTE — Progress Notes (Signed)
Mobility Specialist - Progress Note   05/27/21 1551  Mobility  Activity Ambulated in hall  Level of Assistance Standby assist, set-up cues, supervision of patient - no hands on  Assistive Device None  Distance Ambulated (ft) 470 ft  Mobility Ambulated with assistance in hallway  Mobility Response Tolerated well  Mobility performed by Mobility specialist  $Mobility charge 1 Mobility   Pt's SpO2 stayed >90% on 2L O2 w/ ambulation, 2/4 DOE. Pt back in bed after walk, call bell at side and family in room.   Mamie Levers Mobility Specialist Mobility Specialist Phone: [phone]

## 2021-05-27 NOTE — Progress Notes (Signed)
ANTICOAGULATION CONSULT NOTE  Pharmacy Consult for Heparin Indication: pulmonary embolus  Allergies  Allergen Reactions   Amlodipine Besylate Swelling    Leg swelling with 10mg  daily am dosing; decreased with BID 5mg  dosing   Aspirin Itching    Patient Measurements:   Heparin Dosing Weight: 98.9kg  Vital Signs: Temp: 97.6 F (36.4 C) (06/18 0044) Temp Source: Axillary (06/18 0044) BP: 119/74 (06/18 0044) Pulse Rate: 61 (06/18 0044)  Labs: Recent Labs    05/25/21 1020 05/25/21 1223 05/25/21 1818 05/25/21 2019 05/25/21 2200 05/26/21 0312 05/26/21 1625 05/26/21 1924 05/27/21 0138  HGB 12.3*  --   --   --   --  13.2  --   --  12.4*  HCT 36.4*  --   --   --   --  37.4*  --   --  36.5*  PLT 193  --   --   --   --  178  --   --  187  APTT 31  --   --   --   --   --   --   --   --   LABPROT 13.8  --   --   --   --   --   --   --   --   INR 1.1  --   --   --   --   --   --   --   --   HEPARINUNFRC  --    < > 0.15*  --   --  0.47 0.24*  --  0.13*  CREATININE 1.21  --   --   --   --  1.13  --  1.68* 1.41*  TROPONINIHS 47*   < > 51* 53* 55*  --   --   --   --    < > = values in this interval not displayed.     Estimated Creatinine Clearance: 66.9 mL/min (A) (by C-G formula based on SCr of 1.41 mg/dL (H)).  Assessment: 65 y.o. male with PE, awaiting cath Monday, for heparin  Goal of Therapy:  Heparin level 0.3-0.7 units/ml Monitor platelets by anticoagulation protocol: Yes   Plan:  Heparin 3000 units IV bolus, then increase heparin 2350 units/hr Check heparin level in 6 hours.     76, PharmD, BCPS  05/27/2021 3:28 AM

## 2021-05-27 NOTE — Progress Notes (Addendum)
ANTICOAGULATION CONSULT NOTE  Pharmacy Consult for Heparin Indication: pulmonary embolus  Allergies  Allergen Reactions   Amlodipine Besylate Swelling    Leg swelling with 10mg  daily am dosing; decreased with BID 5mg  dosing   Aspirin Itching    Patient Measurements:   Heparin Dosing Weight: 98.9kg  Vital Signs: Temp: 99.4 F (37.4 C) (06/18 0948) Temp Source: Oral (06/18 0948) BP: 145/86 (06/18 0848) Pulse Rate: 74 (06/18 0848)  Labs: Recent Labs    05/25/21 1020 05/25/21 1223 05/25/21 1818 05/25/21 2019 05/25/21 2200 05/26/21 0312 05/26/21 1625 05/26/21 1924 05/27/21 0138 05/27/21 0948  HGB 12.3*  --   --   --   --  13.2  --   --  12.4*  --   HCT 36.4*  --   --   --   --  37.4*  --   --  36.5*  --   PLT 193  --   --   --   --  178  --   --  187  --   APTT 31  --   --   --   --   --   --   --   --   --   LABPROT 13.8  --   --   --   --   --   --   --   --   --   INR 1.1  --   --   --   --   --   --   --   --   --   HEPARINUNFRC  --    < > 0.15*  --   --  0.47 0.24*  --  0.13* 0.35  CREATININE 1.21  --   --   --   --  1.13  --  1.68* 1.41*  --   TROPONINIHS 47*   < > 51* 53* 55*  --   --   --   --   --    < > = values in this interval not displayed.     Estimated Creatinine Clearance: 66.9 mL/min (A) (by C-G formula based on SCr of 1.41 mg/dL (H)).  Assessment: 65 y.o. male with PE, awaiting cath Monday, for heparin. HL therapeutic at 0.35. Hgb stable around 12, PLT 170-190.   Goal of Therapy:  Heparin level 0.3-0.7 units/ml Monitor platelets by anticoagulation protocol: Yes   Plan:  Continue heparin 2350 units/hr Check heparin level in 8 hours.   Daily cbc, monitor for signs of bleeding   76, PharmD PGY1 Pharmacy Resident 05/27/2021 11:15 AM

## 2021-05-27 NOTE — Evaluation (Signed)
Physical Therapy Evaluation Patient Details Name: Ryan Glass MRN: [id number] DOB: Nov 08, 1956 Today's Date: 05/27/2021   History of Present Illness  Pt is a 65 y/o male presenting with chest pain and dyspnea. Concern for possible DVT. CT angio shows small peripheral pulmonary embolus in  a lateral segment right lower lobe pulmonary artery branch. PMH includes: PVCs s/p PVC ablation (2019), dilated aortic root, HTN, HLD, lower extremity edema,COVID 19 infection, colon polyps s/p polypectomy (2018), OSA.  Clinical Impression  Pt reports not feeling well today, but is agreeable to PT. Pt tolerates ambulation for limited community distances without physical assistance, demonstrating increased WOB. Pt reports tolerating ambulating for longer distances, without SOB at baseline. Pt declines stair training attempt at this time. Pt may benefit from dynamic gait activities and stair training at next session as he has steps to enter home and 2 flights to manage in the house. Pt demonstrates deficits in activity tolerance and will benefit from continued acute PT to aid in return to prior level of function. SPT recommends HHPT to improve tolerance to activity.     Follow Up Recommendations Home health PT    Equipment Recommendations  None recommended by PT    Recommendations for Other Services       Precautions / Restrictions Precautions Precautions: Fall Restrictions Weight Bearing Restrictions: No      Mobility  Bed Mobility Overal bed mobility: Modified Independent             General bed mobility comments: HOB elevated    Transfers Overall transfer level: Independent Equipment used: None                Ambulation/Gait Ambulation/Gait assistance: Min Emergency planning/management officer (Feet): 350 Feet Assistive device: None;IV Pole Gait Pattern/deviations: Step-through pattern;Trunk flexed;Wide base of support Gait velocity: reduced Gait velocity interpretation: 1.31 -  2.62 ft/sec, indicative of limited community ambulator General Gait Details: slow, steady gait with step-through pattern and increased WOB  Stairs            Wheelchair Mobility    Modified Rankin (Stroke Patients Only)       Balance Overall balance assessment: Mild deficits observed, not formally tested                                           Pertinent Vitals/Pain Pain Assessment: Faces Faces Pain Scale: Hurts little more Pain Location: back, R LE Pain Descriptors / Indicators: Discomfort;Grimacing Pain Intervention(s): Limited activity within patient's tolerance;Monitored during session    Home Living Family/patient expects to be discharged to:: Private residence Living Arrangements: Spouse/significant other Available Help at Discharge: Family;Available 24 hours/day Type of Home: House Home Access: Stairs to enter Entrance Stairs-Rails: Can reach both Entrance Stairs-Number of Steps: 5-6 Home Layout: Multi-level Home Equipment: Walker - 2 wheels;Cane - single point      Prior Function Level of Independence: Independent               Hand Dominance        Extremity/Trunk Assessment   Upper Extremity Assessment Upper Extremity Assessment: Overall WFL for tasks assessed    Lower Extremity Assessment Lower Extremity Assessment: Overall WFL for tasks assessed    Cervical / Trunk Assessment Cervical / Trunk Assessment: Normal  Communication   Communication: No difficulties  Cognition Arousal/Alertness: Awake/alert Behavior During Therapy: WFL for tasks assessed/performed Overall Cognitive Status: Within  Functional Limits for tasks assessed                                        General Comments General comments (skin integrity, edema, etc.): Pt on 3 L [place] on arrival, satting between 94-96% at rest.    Exercises     Assessment/Plan    PT Assessment Patient needs continued PT services  PT Problem List  Decreased activity tolerance;Decreased mobility;Decreased knowledge of precautions;Pain       PT Treatment Interventions Gait training;Stair training;Functional mobility training;Therapeutic activities;Therapeutic exercise;Patient/family education    PT Goals (Current goals can be found in the Care Plan section)  Acute Rehab PT Goals Patient Stated Goal: get better and go home PT Goal Formulation: With patient Time For Goal Achievement: 06/10/21 Potential to Achieve Goals: Good    Frequency Min 3X/week   Barriers to discharge        Co-evaluation               AM-PAC PT "6 Clicks" Mobility  Outcome Measure Help needed turning from your back to your side while in a flat bed without using bedrails?: None Help needed moving from lying on your back to sitting on the side of a flat bed without using bedrails?: None Help needed moving to and from a bed to a chair (including a wheelchair)?: None Help needed standing up from a chair using your arms (e.g., wheelchair or bedside chair)?: None Help needed to walk in hospital room?: A Little Help needed climbing 3-5 steps with a railing? : A Little 6 Click Score: 22    End of Session Equipment Utilized During Treatment: Gait belt;Oxygen Activity Tolerance: Patient limited by fatigue Patient left: in chair;with call bell/phone within reach Nurse Communication: Mobility status PT Visit Diagnosis: Other abnormalities of gait and mobility (R26.89);Difficulty in walking, not elsewhere classified (R26.2);Pain Pain - Right/Left: Right Pain - part of body: Leg (and back)    Time: 4268-3419 PT Time Calculation (min) (ACUTE ONLY): 25 min   Charges:   PT Evaluation $PT Eval Low Complexity: 1 Low          Acute Rehab  Pager: [phone]   Waldemar Dickens, SPT  05/27/2021, 12:51 PM

## 2021-05-28 ENCOUNTER — Encounter: Payer: Self-pay | Admitting: Registered Nurse

## 2021-05-28 ENCOUNTER — Inpatient Hospital Stay (HOSPITAL_COMMUNITY): Payer: No Typology Code available for payment source

## 2021-05-28 ENCOUNTER — Telehealth: Payer: Self-pay | Admitting: Registered Nurse

## 2021-05-28 DIAGNOSIS — I2721 Secondary pulmonary arterial hypertension: Secondary | ICD-10-CM

## 2021-05-28 DIAGNOSIS — I2510 Atherosclerotic heart disease of native coronary artery without angina pectoris: Secondary | ICD-10-CM

## 2021-05-28 DIAGNOSIS — R0609 Other forms of dyspnea: Secondary | ICD-10-CM

## 2021-05-28 DIAGNOSIS — I77810 Thoracic aortic ectasia: Secondary | ICD-10-CM | POA: Diagnosis not present

## 2021-05-28 DIAGNOSIS — I2693 Single subsegmental pulmonary embolism without acute cor pulmonale: Secondary | ICD-10-CM

## 2021-05-28 DIAGNOSIS — R0600 Dyspnea, unspecified: Secondary | ICD-10-CM

## 2021-05-28 DIAGNOSIS — I2609 Other pulmonary embolism with acute cor pulmonale: Secondary | ICD-10-CM | POA: Diagnosis not present

## 2021-05-28 LAB — MAGNESIUM: Magnesium: 1.9 mg/dL (ref 1.7–2.4)

## 2021-05-28 LAB — BASIC METABOLIC PANEL
Anion gap: 9 (ref 5–15)
Anion gap: 8 (ref 5–15)
BUN: 15 mg/dL (ref 8–23)
BUN: 20 mg/dL (ref 8–23)
CO2: 23 mmol/L (ref 22–32)
CO2: 21 mmol/L — ABNORMAL LOW (ref 22–32)
Calcium: 8.8 mg/dL — ABNORMAL LOW (ref 8.9–10.3)
Calcium: 8.7 mg/dL — ABNORMAL LOW (ref 8.9–10.3)
Chloride: 102 mmol/L (ref 98–111)
Chloride: 102 mmol/L (ref 98–111)
Creatinine, Ser: 1.21 mg/dL (ref 0.61–1.24)
Creatinine, Ser: 1.57 mg/dL — ABNORMAL HIGH (ref 0.61–1.24)
GFR, Estimated: >60 mL/min (ref >60)
GFR, Estimated: 49 mL/min — ABNORMAL LOW (ref >60)
Glucose, Bld: 96 mg/dL (ref 70–99)
Glucose, Bld: 135 mg/dL — ABNORMAL HIGH (ref 70–99)
Potassium: 3.6 mmol/L (ref 3.5–5.1)
Potassium: 4.2 mmol/L (ref 3.5–5.1)
Sodium: 134 mmol/L — ABNORMAL LOW (ref 135–145)
Sodium: 131 mmol/L — ABNORMAL LOW (ref 135–145)

## 2021-05-28 LAB — BRAIN NATRIURETIC PEPTIDE: B Natriuretic Peptide: 61.4 pg/mL (ref 0.0–100.0)

## 2021-05-28 LAB — CBC
HCT: 35.9 % — ABNORMAL LOW (ref 39.0–52.0)
Hemoglobin: 12.2 g/dL — ABNORMAL LOW (ref 13.0–17.0)
MCH: 33.2 pg (ref 26.0–34.0)
MCHC: 34 g/dL (ref 30.0–36.0)
MCV: 97.6 fL (ref 80.0–100.0)
Platelets: 207 10*3/uL (ref 150–400)
RBC: 3.68 MIL/uL — ABNORMAL LOW (ref 4.22–5.81)
RDW: 15.2 % (ref 11.5–15.5)
WBC: 4.9 10*3/uL (ref 4.0–10.5)
nRBC: 0 % (ref 0.0–0.2)

## 2021-05-28 LAB — URINALYSIS, ROUTINE W REFLEX MICROSCOPIC
Bilirubin Urine: NEGATIVE
Glucose, UA: NEGATIVE mg/dL
Hgb urine dipstick: NEGATIVE
Ketones, ur: NEGATIVE mg/dL
Leukocytes,Ua: NEGATIVE
Nitrite: NEGATIVE
Protein, ur: NEGATIVE mg/dL
Specific Gravity, Urine: 1.01 (ref 1.005–1.030)
pH: 7 (ref 5.0–8.0)

## 2021-05-28 LAB — HEPARIN LEVEL (UNFRACTIONATED)
Heparin Unfractionated: 0.76 IU/mL — ABNORMAL HIGH (ref 0.30–0.70)
Heparin Unfractionated: 0.32 IU/mL (ref 0.30–0.70)

## 2021-05-28 IMAGING — CR DG CHEST 2V
2 series · 2 of 2 positions shown · non-contrast
Comparison: [DATE]

CLINICAL DATA: Dyspnea on exertion

EXAM:
CHEST - 2 VIEW

[chest lat]
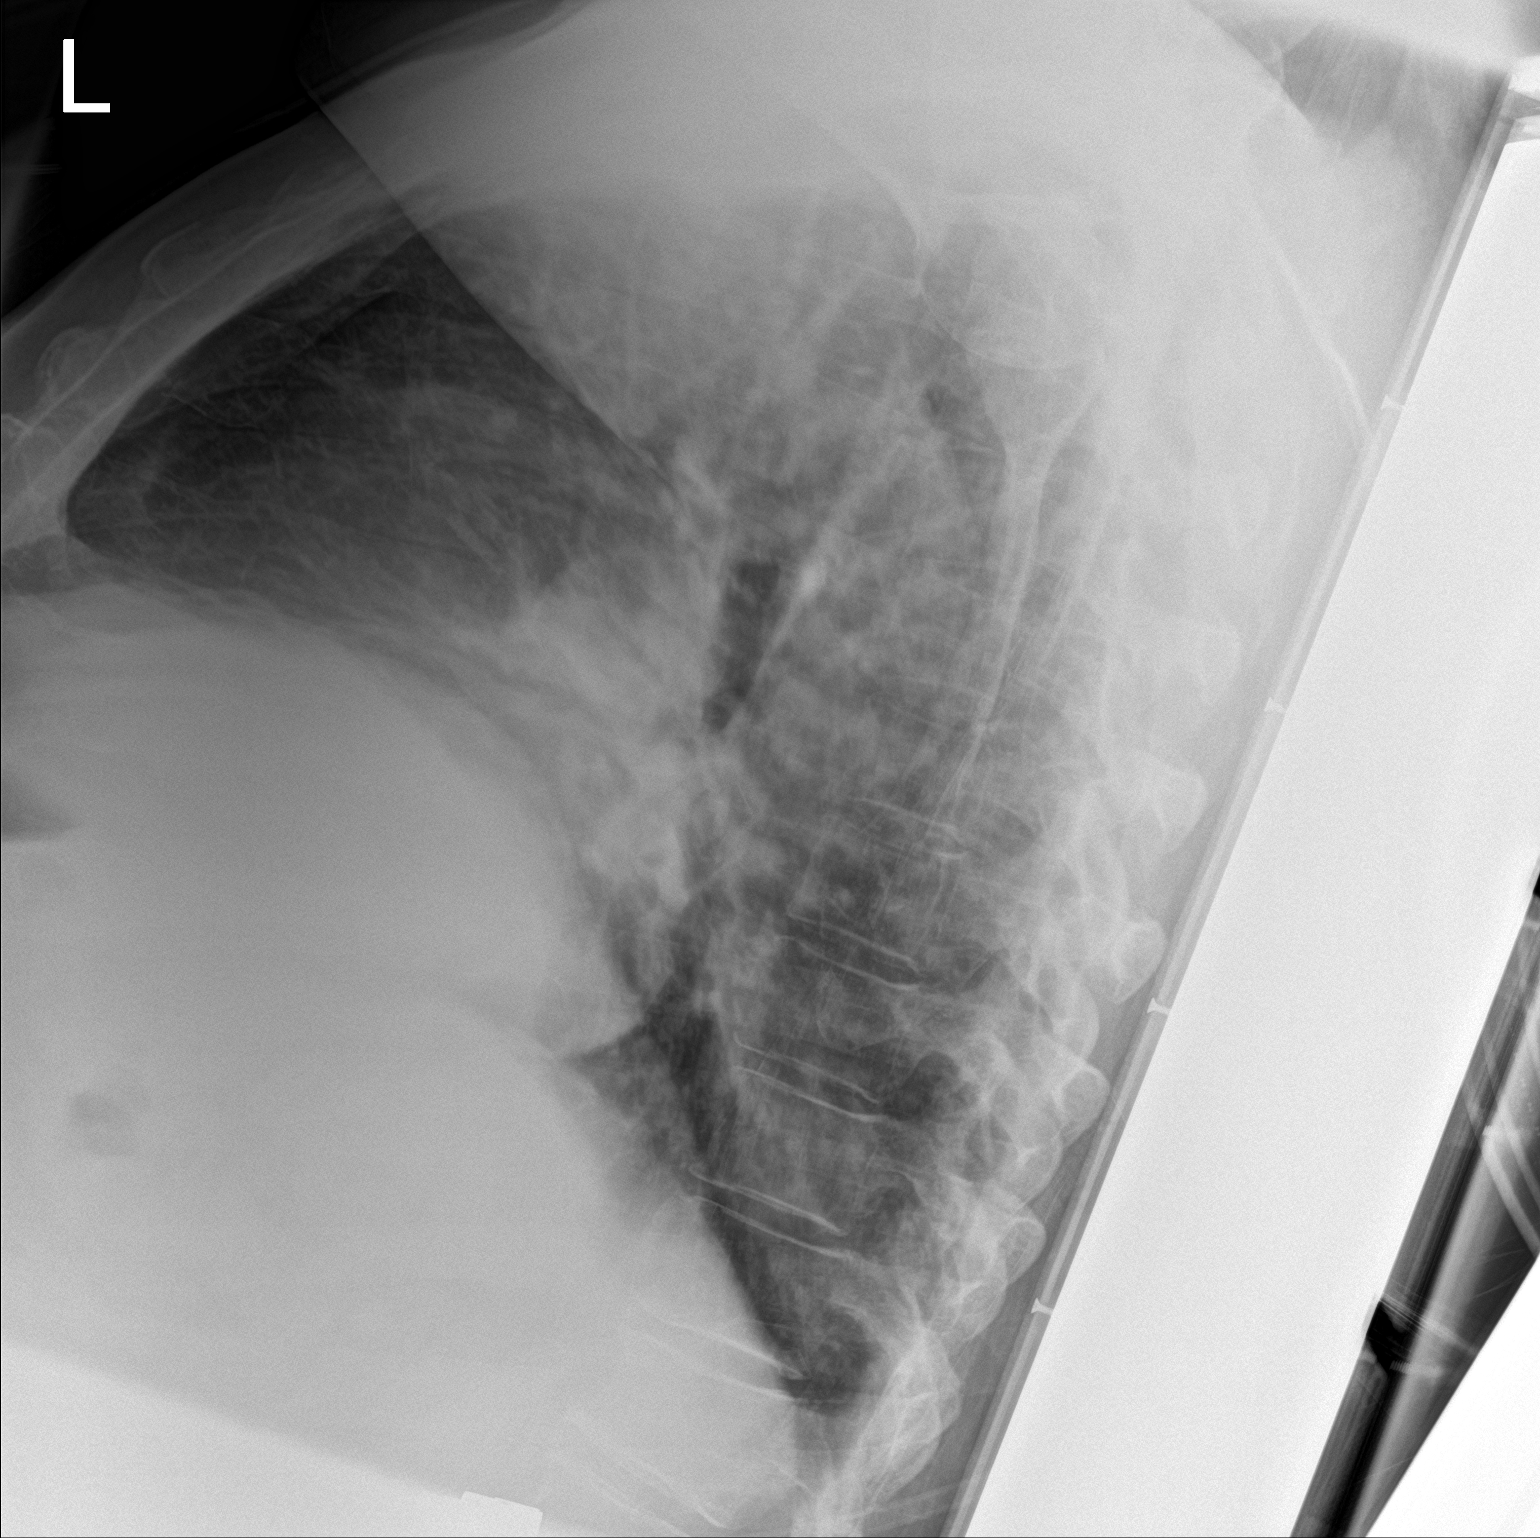

[chest ap]
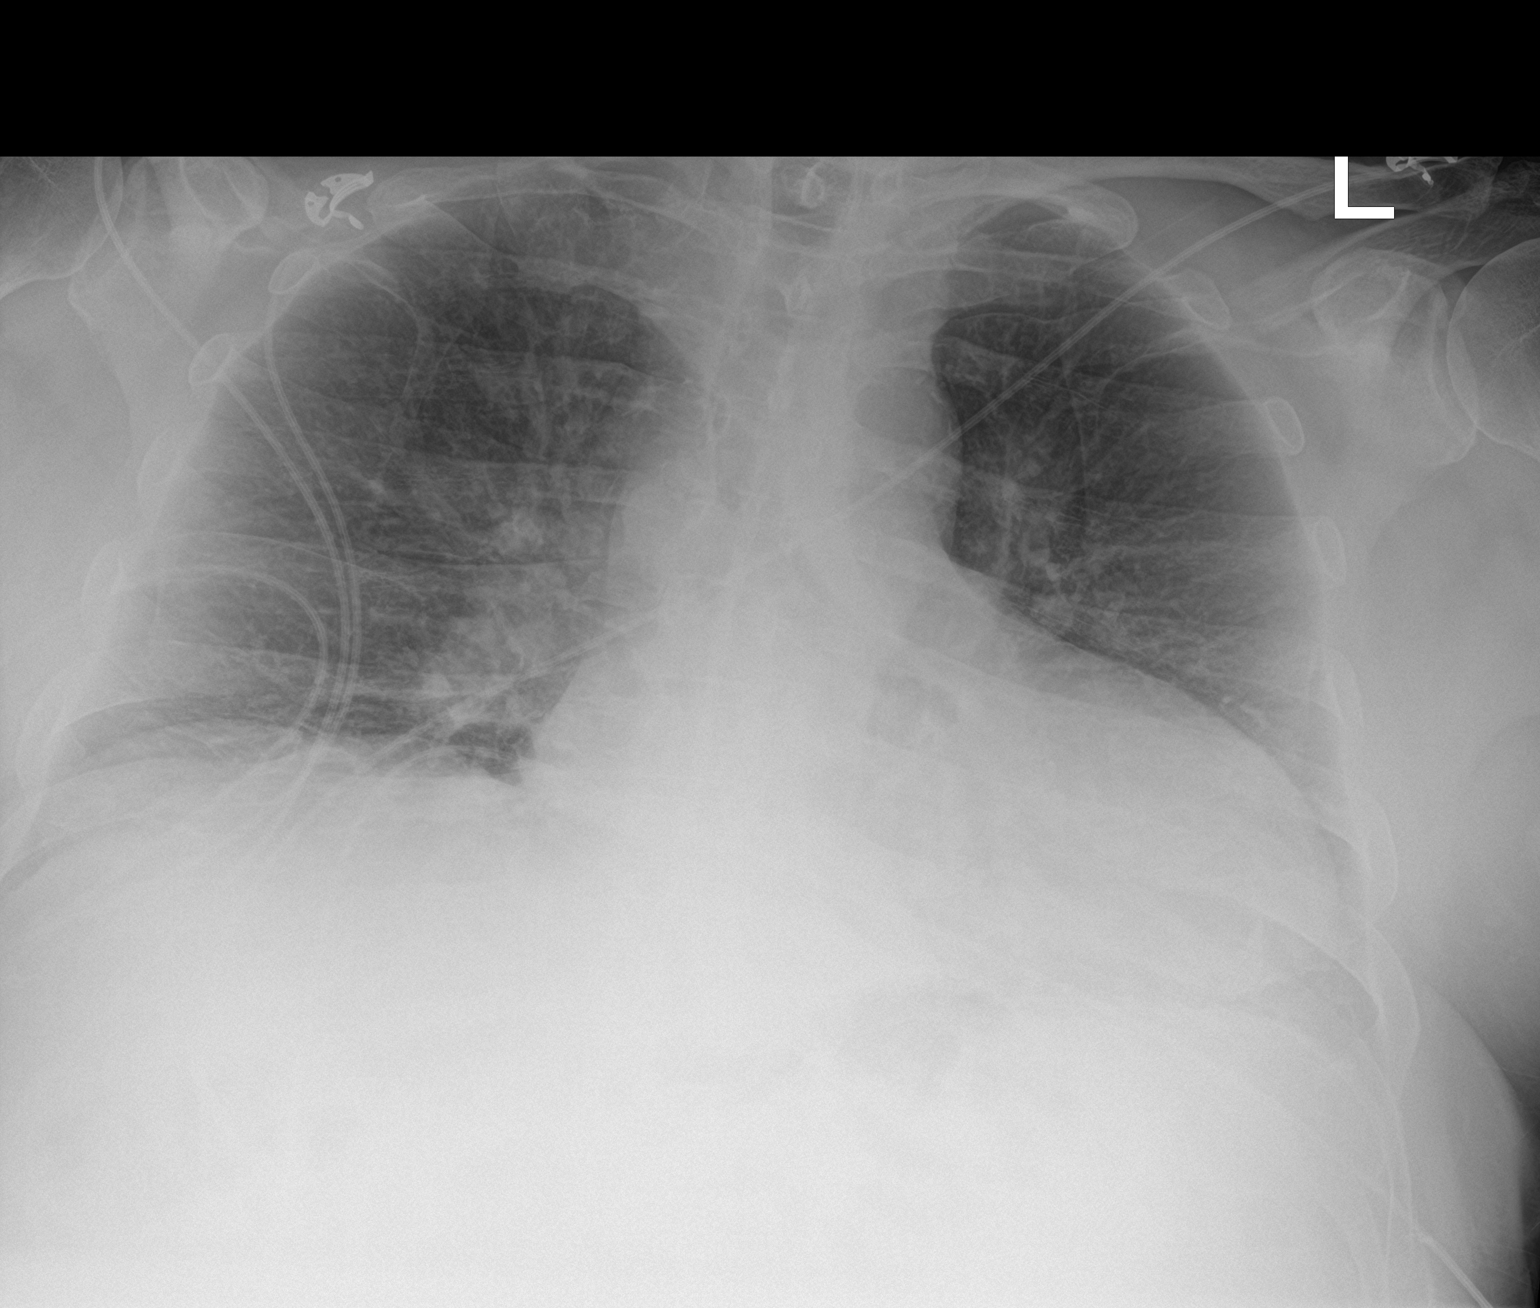

[2 of 2 positions shown; findings below may reference images not displayed]

FINDINGS: Cardiac shadow is enlarged. Mild central vascular congestion is
noted. No focal infiltrate or effusion is seen. No bony abnormality
is noted.
IMPRESSION: Changes of mild CHF.

## 2021-05-28 MED ORDER — FUROSEMIDE 40 MG PO TABS
40.0000 mg | ORAL_TABLET | Freq: Two times a day (BID) | ORAL | Status: DC
  Administered 2021-05-29: 40 mg via ORAL
  Filled 2021-05-28: qty 1

## 2021-05-28 MED ORDER — GABAPENTIN 400 MG PO CAPS
400.0000 mg | ORAL_CAPSULE | Freq: Three times a day (TID) | ORAL | Status: DC
  Administered 2021-05-28 – 2021-05-30 (×6): 400 mg via ORAL
  Filled 2021-05-28 (×6): qty 1

## 2021-05-28 MED ORDER — LORATADINE 10 MG PO TABS
10.0000 mg | ORAL_TABLET | Freq: Every day | ORAL | Status: DC
  Administered 2021-05-28 – 2021-05-30 (×3): 10 mg via ORAL
  Filled 2021-05-28 (×3): qty 1

## 2021-05-28 MED ORDER — GUAIFENESIN-DM 100-10 MG/5ML PO SYRP
5.0000 mL | ORAL_SOLUTION | ORAL | Status: DC | PRN
Start: 2021-05-28 — End: 2021-05-31
  Administered 2021-05-29: 5 mL via ORAL
  Filled 2021-05-28: qty 5
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
## 2021-05-28 NOTE — Progress Notes (Signed)
ANTICOAGULATION CONSULT NOTE  Pharmacy Consult for Heparin Indication: pulmonary embolus  Allergies  Allergen Reactions   Amlodipine Besylate Swelling    Leg swelling with 10mg  daily am dosing; decreased with BID 5mg  dosing   Aspirin Itching    Patient Measurements: Weight: 116.8 kg (257 lb 8 oz) Heparin Dosing Weight: 98.9kg  Vital Signs: Temp: 99.4 F (37.4 C) (06/19 1126) Temp Source: Oral (06/19 1126) BP: 132/87 (06/19 1126) Pulse Rate: 94 (06/19 1126)  Labs: Recent Labs    05/25/21 1818 05/25/21 1818 05/25/21 2019 05/25/21 2200 05/26/21 0312 05/26/21 1625 05/27/21 0138 05/27/21 0948 05/27/21 1758 05/27/21 1947 05/28/21 0143 05/28/21 1435  HGB  --    < >  --   --  13.2  --  12.4*  --   --   --  12.2*  --   HCT  --   --   --   --  37.4*  --  36.5*  --   --   --  35.9*  --   PLT  --   --   --   --  178  --  187  --   --   --  207  --   HEPARINUNFRC 0.15*  --   --   --  0.47   < > 0.13*   < > 0.47  --  0.76* 0.32  CREATININE  --   --   --   --  1.13   < > 1.41*  --   --  1.35* 1.21  --   TROPONINIHS 51*  --  53* 55*  --   --   --   --   --   --   --   --    < > = values in this interval not displayed.     Estimated Creatinine Clearance: 77.9 mL/min (by C-G formula based on SCr of 1.21 mg/dL).  Assessment: 65 y.o. male with PE, awaiting cath Monday, for heparin.  Heparin drip 2350 units/hr  and HL slightly supratherapeutic at 0.76. Hgb stable around 12, PLT 190-200s stable   Per nursing, no signs of bleeding, and labs were drawn from the opposite arm to heparin infusion. Will make a 1unit/kg/hr reduction in gtt rate.  Goal of Therapy:  Heparin level 0.3-0.7 units/ml Monitor platelets by anticoagulation protocol: Yes   Plan:  Decrease heparin to 2250 units/hr HL in 6 hours Daily CBC, heparin level monitor for signs of bleeding  76, PharmD PGY1 Pharmacy Resident 05/28/2021 3:28 PM  ADDENDUM: Repeat HL 0.32, therapeutic, but acute  drop Increase heparin slightly to 2300 units/hr Daily HL and CBC

## 2021-05-28 NOTE — Progress Notes (Addendum)
Family Medicine Teaching Service Daily Progress Note Intern Pager: [phone]  Patient name: Ryan Glass Medical record number: [id number] Date of birth: [birth year] Age: 65 y.o. Gender: male  Primary Care Provider: [name], MD Consultants: Cards, Pharmacy Code Status: Full  Pt Overview and Major Events to Date:  6/16: admitted   Assessment and Plan: Ryan Glass is a 65 year old male who presented with chest pain and dyspnea and was found to have acute PE. PMH significant for PVCs s/p ablation (2019), dilated aortic root, HTN, HLD, OSA, rheumatoid arthritis, and gout.  Chest Pain  PE  PVC's  HFmrEF Cardiology planning [facility] tomorrow. Echo shows moderately reduced systolic function, EF 45-50% and G1DD. Stale aortic dilation.  Patient with increased work of breathing and DOE on room air. BNP 61. Considering SGLT-2 Ryan Glass) for heart failure.   - cardiology following, appreciate recommendations  - Lasix per cardiology  - Continue heparin - consider Farxiga   Fever Had elevated temperature yesterday afternoon (100.20F) likely due to PE but will get CXR, UA/urine culture, blood cultures.  - Follow up UA, urine and blood culture - Follow up CXR    Hypokalemia Potassium 3.6 this AM.  Magnesium 1.9.   Likely due to agressively Lasix diuresis    - Replete IV and po K as needed  - Repeat this afternoon - AM BMP, Mg - Add on Spironolactone  AKI Cr- 1.13>1.68>1.41>>1.21. Serum creatine down-trending.  - AM BMP  FEN/GI: Heart healthy diet PPx: Heparin   Status is: Inpatient  Remains inpatient appropriate because:Inpatient level of care appropriate due to severity of illness  Dispo: The patient is from: Home              Anticipated d/c is to: Home              Patient currently is not medically stable to d/c.   Difficult to place patient No   Subjective:  Pt reports shortness of breath. RN states pt's O2 sats drop when going to the bathroom. He c/o post nasal  drip.    Objective: Temp:  [97.9 F (36.6 C)-100.6 F (38.1 C)] 98.5 F (36.9 C) (06/19 0758) Pulse Rate:  [30-85] 71 (06/19 0758) Resp:  [17-20] 20 (06/19 0758) BP: (115-148)/(65-96) 146/95 (06/19 0758) SpO2:  [90 %-95 %] 94 % (06/19 0758) FiO2 (%):  [32 %] 32 % (06/18 0832) Weight:  [116.8 kg] 116.8 kg (06/19 0347) Physical Exam:  GEN: well developed male in no acute distress  CV: regular rate and rhythm, no murmurs appreciated  RESP: mild increased work of breathing (tachypnea), diminished in R>L ABD: Bowel sounds present. Soft, Nontender, Nondistended. MSK: no LE edema SKIN: warm, dry    Laboratory: Recent Labs  Lab 05/26/21 0312 05/27/21 0138 05/28/21 0143  WBC 5.2 5.1 4.9  HGB 13.2 12.4* 12.2*  HCT 37.4* 36.5* 35.9*  PLT 178 187 207    Recent Labs  Lab 05/27/21 0138 05/27/21 1947 05/28/21 0143  NA 134* 133* 134*  K 3.0* 4.0 3.6  CL 103 103 102  CO2 22 21* 23  BUN 20 19 15   CREATININE 1.41* 1.35* 1.21  CALCIUM 8.7* 8.7* 8.8*  PROT 5.7*  --   --   BILITOT 1.0  --   --   ALKPHOS 63  --   --   ALT 24  --   --   AST 20  --   --   GLUCOSE 102* 120* 96  Mg- 2.1>1.9  Imaging/Diagnostic Tests: No results found.   Ryan Cabal, DO 05/28/2021, 8:27 AM PGY-2, [place] [facility] FPTS Intern pager: [phone], text pages welcome

## 2021-05-28 NOTE — Progress Notes (Signed)
DAILY PROGRESS NOTE   Patient Name: Ryan Glass Date of Encounter: 05/28/2021 Cardiologist: [name], MD  Chief Complaint   Fevers, post-nasal drip/cough  Patient Profile   Ryan Glass is a 65 y.o. 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 Clayborne Artist.  Subjective   Negative 800 cc recorded overnight - 1.5L negative total. Creatinine improved with diuresis. BNP is low.  Still says he does not feel well -dull chest discomfort. Having "fevers", but Tmax <101.5, also c/o draining and upper airway cough. Less PVC's noted overnight.   Objective   Vitals:   05/27/21 2333 05/28/21 0347 05/28/21 0758 05/28/21 0848  BP: 126/89 (!) 148/96 (!) 146/95   Pulse: 60 73 71   Resp: 20 20 20    Temp: 98 F (36.7 C) 97.9 F (36.6 C) 98.5 F (36.9 C)   TempSrc: Oral Oral Oral   SpO2: 90% 93% 94% 95%  Weight:  116.8 kg      Intake/Output Summary (Last 24 hours) at 05/28/2021 1039 Last data filed at 05/27/2021 2334 Gross per 24 hour  Intake --  Output 1000 ml  Net -1000 ml   Filed Weights   05/28/21 0347  Weight: 116.8 kg    Physical Exam   General appearance: alert and no distress Neck: no carotid bruit, no JVD, and thyroid not enlarged, symmetric, no tenderness/mass/nodules Lungs: diminished breath sounds bibasilar Heart: regular rate and rhythm and occasional extra beats Abdomen: soft, non-tender; bowel sounds normal; no masses,  no organomegaly Extremities: extremities normal, atraumatic, no cyanosis or edema Pulses: 2+ and symmetric Skin: Skin color, texture, turgor normal. No rashes or lesions Neurologic: Grossly normal Psych:  Pleasant  Inpatient Medications    Scheduled Meds:  atorvastatin  40 mg Oral Daily   diltiazem  180 mg Oral Daily   fluticasone  1 spray Each Nare BID   fluticasone furoate-vilanterol  1 puff Inhalation Daily   folic acid  1 mg Oral Daily   furosemide  40 mg Intravenous BID   gabapentin  400 mg Oral TID   hydrALAZINE  100 mg Oral TID   losartan  100 mg Oral Daily   magnesium oxide  400 mg Oral Daily   nebivolol  10 mg Oral QHS   potassium chloride  20 mEq Oral TID   potassium chloride  40 mEq Oral BID   predniSONE  10 mg Oral Daily   sodium chloride flush  3 mL Intravenous Q12H   spironolactone  12.5 mg Oral Daily    Continuous Infusions:  heparin 2,250 Units/hr (05/28/21 0928)    PRN Meds: albuterol, benzonatate   Labs   Results for orders placed or performed during the hospital encounter of 05/25/21 (from the past 48 hour(s))  Heparin level (unfractionated)     Status: Abnormal   Collection Time: 05/26/21  4:25 PM  Result Value Ref Range   Heparin Unfractionated 0.24 (L) 0.30 - 0.70 IU/mL    Comment: (NOTE) The clinical reportable range upper limit is being lowered to >1.10 to align with the FDA approved guidance for the current laboratory assay.  If heparin results are below expected values, and patient dosage has  been  confirmed, suggest follow up testing of antithrombin III levels. Performed at [facility], [address], [place], [place] [address]   TSH     Status: None   Collection Time: 05/26/21  4:25 PM  Result Value Ref Range   TSH 0.559 0.350 - 4.500 uIU/mL    Comment: Performed by a 3rd Generation assay with a functional sensitivity of <=0.01 uIU/mL. Performed at [facility], [address], [place], [place] [address]   Basic metabolic panel     Status: Abnormal   Collection Time: 05/26/21  7:24 PM  Result Value Ref Range   Sodium 134 (L) 135 - 145 mmol/L   Potassium 2.9 (L) 3.5 - 5.1 mmol/L   Chloride 100 98 - 111 mmol/L   CO2 24  22 - 32 mmol/L   Glucose, Bld 135 (H) 70 - 99 mg/dL    Comment: Glucose reference range applies only to samples taken after fasting for at least 8 hours.   BUN 20 8 - 23 mg/dL   Creatinine, Ser 0.98 (H) 0.61 - 1.24 mg/dL   Calcium 8.8 (L) 8.9 - 10.3 mg/dL   GFR, Estimated 45 (L) >60 mL/min    Comment: (NOTE) Calculated using the CKD-EPI Creatinine Equation (2021)    Anion gap 10 5 - 15    Comment: Performed at [facility], [address], [place], [place] [address]  CBC     Status: Abnormal   Collection Time: 05/27/21  1:38 AM  Result Value Ref Range   WBC 5.1 4.0 - 10.5 K/uL   RBC 3.71 (L) 4.22 - 5.81 MIL/uL   Hemoglobin 12.4 (L) 13.0 - 17.0 g/dL   HCT 78.2 (L) 95.6 - 21.3 %   MCV 98.4 80.0 - 100.0 fL   MCH 33.4 26.0 - 34.0 pg   MCHC 34.0 30.0 - 36.0 g/dL   RDW 08.6 57.8 - 46.9 %   Platelets 187 150 - 400 K/uL   nRBC 0.0 0.0 - 0.2 %    Comment: Performed at [facility], [address], [place], [place] [address]  Heparin level (unfractionated)     Status: Abnormal   Collection Time: 05/27/21  1:38 AM  Result Value Ref Range   Heparin Unfractionated 0.13 (L) 0.30 - 0.70 IU/mL    Comment: (NOTE) The clinical reportable range upper limit is being lowered to >1.10 to align with the FDA approved guidance for the current laboratory assay.  If heparin results are below expected values, and patient dosage has  been confirmed, suggest follow up testing of antithrombin III levels. Performed at [facility], [address] [place], [place] [address]   Magnesium     Status: None   Collection Time: 05/27/21  1:38 AM  Result Value Ref Range   Magnesium 2.1 1.7 - 2.4 mg/dL    Comment: Performed at [facility], [address] [place], [place] [address]  Basic metabolic panel     Status: Abnormal   Collection Time: 05/27/21  1:38 AM  Result Value Ref Range   Sodium 134 (L) 135 - 145 mmol/L   Potassium 3.0 (L) 3.5 - 5.1 mmol/L   Chloride 103 98 - 111  mmol/L   CO2 22 22 - 32 mmol/L   Glucose, Bld 102 (H) 70 - 99 mg/dL    Comment: Glucose reference range applies only to samples taken after fasting for at least 8 hours.   BUN 20 8 -  23 mg/dL   Creatinine, Ser 1.61 (H) 0.61 - 1.24 mg/dL   Calcium 8.7 (L) 8.9 - 10.3 mg/dL   GFR, Estimated 55 (L) >60 mL/min    Comment: (NOTE) Calculated using the CKD-EPI Creatinine Equation (2021)    Anion gap 9 5 - 15    Comment: Performed at [facility], [address] [place], [place] [address]  Lipid panel     Status: None   Collection Time: 05/27/21  1:38 AM  Result Value Ref Range   Cholesterol 123 0 - 200 mg/dL   Triglycerides 67 <540 mg/dL   HDL 49 >98 mg/dL   Total CHOL/HDL Ratio 2.5 RATIO   VLDL 13 0 - 40 mg/dL   LDL Cholesterol 61 0 - 99 mg/dL    Comment:        Total Cholesterol/HDL:CHD Risk Coronary Heart Disease Risk Table                     Men   Women  1/2 Average Risk   3.4   3.3  Average Risk       5.0   4.4  2 X Average Risk   9.6   7.1  3 X Average Risk  23.4   11.0        Use the calculated Patient Ratio above and the CHD Risk Table to determine the patient's CHD Risk.        ATP III CLASSIFICATION (LDL):  <100     mg/dL   Optimal  119-147  mg/dL   Near or Above                    Optimal  130-159  mg/dL   Borderline  829-562  mg/dL   High  >130     mg/dL   Very High Performed at [facility], [address], [place], [place] [address]   Hepatic function panel     Status: Abnormal   Collection Time: 05/27/21  1:38 AM  Result Value Ref Range   Total Protein 5.7 (L) 6.5 - 8.1 g/dL   Albumin 3.0 (L) 3.5 - 5.0 g/dL   AST 20 15 - 41 U/L   ALT 24 0 - 44 U/L   Alkaline Phosphatase 63 38 - 126 U/L   Total Bilirubin 1.0 0.3 - 1.2 mg/dL   Bilirubin, Direct 0.3 (H) 0.0 - 0.2 mg/dL   Indirect Bilirubin 0.7 0.3 - 0.9 mg/dL    Comment: Performed at [facility], [address] [place], [place] [address]  Heparin level (unfractionated)     Status: None    Collection Time: 05/27/21  9:48 AM  Result Value Ref Range   Heparin Unfractionated 0.35 0.30 - 0.70 IU/mL    Comment: (NOTE) The clinical reportable range upper limit is being lowered to >1.10 to align with the FDA approved guidance for the current laboratory assay.  If heparin results are below expected values, and patient dosage has  been confirmed, suggest follow up testing of antithrombin III levels. Performed at [facility], [address], [place], [place] [address]   Heparin level (unfractionated)     Status: None   Collection Time: 05/27/21  5:58 PM  Result Value Ref Range   Heparin Unfractionated 0.47 0.30 - 0.70 IU/mL    Comment: (NOTE) The clinical reportable range upper limit is being lowered to >1.10 to align with the FDA approved guidance for the  current laboratory assay.  If heparin results are below expected values, and patient dosage has  been confirmed, suggest follow up testing of antithrombin III levels. Performed at [facility], [address], [place], [place] [address]   Basic metabolic panel     Status: Abnormal   Collection Time: 05/27/21  7:47 PM  Result Value Ref Range   Sodium 133 (L) 135 - 145 mmol/L   Potassium 4.0 3.5 - 5.1 mmol/L   Chloride 103 98 - 111 mmol/L   CO2 21 (L) 22 - 32 mmol/L   Glucose, Bld 120 (H) 70 - 99 mg/dL    Comment: Glucose reference range applies only to samples taken after fasting for at least 8 hours.   BUN 19 8 - 23 mg/dL   Creatinine, Ser 6.04 (H) 0.61 - 1.24 mg/dL   Calcium 8.7 (L) 8.9 - 10.3 mg/dL   GFR, Estimated 58 (L) >60 mL/min    Comment: (NOTE) Calculated using the CKD-EPI Creatinine Equation (2021)    Anion gap 9 5 - 15    Comment: Performed at [facility], [address] [place], [place] [address]  CBC     Status: Abnormal   Collection Time: 05/28/21  1:43 AM  Result Value Ref Range   WBC 4.9 4.0 - 10.5 K/uL   RBC 3.68 (L) 4.22 - 5.81 MIL/uL   Hemoglobin 12.2 (L) 13.0 - 17.0  g/dL   HCT 11.9 (L) 14.7 - 82.9 %   MCV 97.6 80.0 - 100.0 fL   MCH 33.2 26.0 - 34.0 pg   MCHC 34.0 30.0 - 36.0 g/dL   RDW 56.2 13.0 - 86.5 %   Platelets 207 150 - 400 K/uL   nRBC 0.0 0.0 - 0.2 %    Comment: Performed at [facility], [address] [place], [place] [address]  Heparin level (unfractionated)     Status: Abnormal   Collection Time: 05/28/21  1:43 AM  Result Value Ref Range   Heparin Unfractionated 0.76 (H) 0.30 - 0.70 IU/mL    Comment: (NOTE) The clinical reportable range upper limit is being lowered to >1.10 to align with the FDA approved guidance for the current laboratory assay.  If heparin results are below expected values, and patient dosage has  been confirmed, suggest follow up testing of antithrombin III levels. Performed at [facility], [address] [place], [place] [address]   Magnesium     Status: None   Collection Time: 05/28/21  1:43 AM  Result Value Ref Range   Magnesium 1.9 1.7 - 2.4 mg/dL    Comment: Performed at [facility], [address], [place], [place] [address]  Basic metabolic panel     Status: Abnormal   Collection Time: 05/28/21  1:43 AM  Result Value Ref Range   Sodium 134 (L) 135 - 145 mmol/L   Potassium 3.6 3.5 - 5.1 mmol/L   Chloride 102 98 - 111 mmol/L   CO2 23 22 - 32 mmol/L   Glucose, Bld 96 70 - 99 mg/dL    Comment: Glucose reference range applies only to samples taken after fasting for at least 8 hours.   BUN 15 8 - 23 mg/dL   Creatinine, Ser 4.40 0.61 - 1.24 mg/dL   Calcium 8.8 (L) 8.9 - 10.3 mg/dL   GFR, Estimated >10 >27 mL/min    Comment: (NOTE) Calculated using the CKD-EPI Creatinine Equation (2021)    Anion gap 9  5 - 15    Comment: Performed at [facility], [address], [place], [place] [address]  Brain natriuretic peptide     Status: None   Collection Time: 05/28/21  1:43 AM  Result Value Ref Range   B Natriuretic Peptide 61.4 0.0 - 100.0 pg/mL    Comment: Performed at [facility], [address] [place], [place] [address]    ECG   N/A  Telemetry   Sinus with PVC's - Personally Reviewed  Radiology    ECHOCARDIOGRAM COMPLETE  Result Date: 05/26/2021    ECHOCARDIOGRAM REPORT   Patient Name:   Ryan Glass Date of Exam: 05/26/2021 Medical Rec #:  [id number]      Height:       70.0 in Accession #:    [id number]     Weight:       257.4 lb Date of Birth:  1956/08/23       BSA:          2.324 m Patient Age:    65 years       BP:           133/81 mmHg Patient Gender: M              HR:           77 bpm. Exam Location:  Inpatient Procedure: 2D Echo, Cardiac Doppler and Color Doppler Indications:    Pulmonary embolus  History:        Patient has prior history of Echocardiogram examinations, most                 recent 02/08/2021.  Sonographer:    Neomia Dear RDCS Referring Phys: [id number] TODD D MCDIARMID IMPRESSIONS  1. Left ventricular ejection fraction, by estimation, is 45 to 50%. The left ventricle has mildly decreased function. The left ventricle has no regional wall motion abnormalities. There is severe asymmetric left ventricular hypertrophy. Left ventricular  diastolic parameters are consistent with Grade I diastolic dysfunction (impaired relaxation).  2. Right ventricular systolic function is moderately reduced. The right ventricular size is moderately enlarged. There is moderately elevated pulmonary artery systolic pressure.  3. Left atrial size was moderately dilated.  4. Right atrial size was mildly dilated.  5. The mitral valve is normal in structure. Mild mitral valve regurgitation.  6. The aortic valve is normal in structure. Aortic valve regurgitation is not visualized. No aortic stenosis is present.  7. Aortic dilatation noted. There is mild dilatation of the ascending aorta, measuring 41 mm. FINDINGS  Left Ventricle: Left ventricular ejection fraction, by estimation, is 45 to 50%. The left ventricle has mildly decreased function. The left ventricle has no  regional wall motion abnormalities. The left ventricular internal cavity size was normal in size. There is severe asymmetric left ventricular hypertrophy. Left ventricular diastolic parameters are consistent with Grade I diastolic dysfunction (impaired relaxation). Right Ventricle: The right ventricular size is moderately enlarged. Right vetricular wall thickness was not well visualized. Right ventricular systolic function is moderately reduced. There is moderately elevated pulmonary artery systolic pressure. The tricuspid regurgitant velocity is 3.27 m/s, and with an assumed right atrial pressure of 8 mmHg, the estimated right ventricular systolic pressure is 50.8 mmHg. Left Atrium: Left atrial size was moderately dilated. Right Atrium: Right atrial size was mildly dilated. Pericardium: There is no evidence of pericardial effusion. Mitral Valve: The mitral valve is normal in structure. Mild mitral valve regurgitation. Tricuspid Valve: The tricuspid valve  is grossly normal. Tricuspid valve regurgitation is mild. Aortic Valve: The aortic valve is normal in structure. Aortic valve regurgitation is not visualized. No aortic stenosis is present. Aortic valve mean gradient measures 7.0 mmHg. Aortic valve peak gradient measures 13.1 mmHg. Aortic valve area, by VTI measures 4.24 cm. Pulmonic Valve: The pulmonic valve was normal in structure. Pulmonic valve regurgitation is not visualized. Aorta: Aortic dilatation noted. There is mild dilatation of the ascending aorta, measuring 41 mm. IAS/Shunts: The atrial septum is grossly normal.  LEFT VENTRICLE PLAX 2D LVIDd:         6.20 cm      Diastology LVIDs:         4.60 cm      LV e' medial:  3.68 cm/s LV PW:         1.10 cm      LV e' lateral: 7.34 cm/s LV IVS:        1.70 cm LVOT diam:     2.90 cm LV SV:         141 LV SV Index:   61 LVOT Area:     6.61 cm  LV Volumes (MOD) LV vol d, MOD A2C: 100.0 ml LV vol d, MOD A4C: 142.0 ml LV vol s, MOD A2C: 46.0 ml LV vol s, MOD A4C:  80.6 ml LV SV MOD A2C:     54.0 ml LV SV MOD A4C:     142.0 ml LV SV MOD BP:      64.9 ml RIGHT VENTRICLE RV Basal diam:  6.10 cm RV Mid diam:    4.60 cm RV S prime:     20.00 cm/s TAPSE (M-mode): 4.3 cm LEFT ATRIUM              Index LA diam:        4.60 cm  1.98 cm/m LA Vol (A2C):   105.0 ml 45.19 ml/m LA Vol (A4C):   85.0 ml  36.58 ml/m LA Biplane Vol: 101.0 ml 43.47 ml/m  AORTIC VALVE AV Area (Vmax):    3.94 cm AV Area (Vmean):   4.47 cm AV Area (VTI):     4.24 cm AV Vmax:           181.00 cm/s AV Vmean:          118.000 cm/s AV VTI:            0.332 m AV Peak Grad:      13.1 mmHg AV Mean Grad:      7.0 mmHg LVOT Vmax:         108.00 cm/s LVOT Vmean:        79.900 cm/s LVOT VTI:          0.213 m LVOT/AV VTI ratio: 0.64  AORTA Ao Root diam: 4.80 cm Ao Asc diam:  4.10 cm MITRAL VALVE               TRICUSPID VALVE MV Area (PHT): 3.27 cm    TR Peak grad:   42.8 mmHg MV Decel Time: 232 msec    TR Vmax:        327.00 cm/s MV A velocity: 90.70 cm/s                            SHUNTS  Systemic VTI:  0.21 m                            Systemic Diam: 2.90 cm Kristeen Miss MD Electronically signed by Kristeen Miss MD Signature Date/Time: 05/26/2021/5:04:48 PM    Final    VAS [place] LOWER EXTREMITY VENOUS (DVT)  Result Date: 05/26/2021  Lower Venous DVT Study Patient Name:  Ryan Glass  Date of Exam:   05/26/2021 Medical Rec #: [id number]       Accession #:    [id number] Date of Birth: [birth year]        Patient Gender: M Patient Age:   60Y Exam Location:  [facility] Procedure:      VAS [place] LOWER EXTREMITY VENOUS (DVT) Referring Phys: [id number] ROBERT LOCKWOOD --------------------------------------------------------------------------------  Indications: Pulmonary embolism.  Comparison Study: No prior study Performing Technologist: Gertie Fey MHA, RDMS, RVT, RDCS  Examination Guidelines: A complete evaluation includes B-mode imaging, spectral Doppler, color Doppler, and power Doppler  as needed of all accessible portions of each vessel. Bilateral testing is considered an integral part of a complete examination. Limited examinations for reoccurring indications may be performed as noted. The reflux portion of the exam is performed with the patient in reverse Trendelenburg.  +---------+---------------+---------+-----------+----------+--------------+ RIGHT    CompressibilityPhasicitySpontaneityPropertiesThrombus Aging +---------+---------------+---------+-----------+----------+--------------+ CFV      Full           Yes      Yes                                 +---------+---------------+---------+-----------+----------+--------------+ SFJ      Full                                                        +---------+---------------+---------+-----------+----------+--------------+ FV Prox  Full                                                        +---------+---------------+---------+-----------+----------+--------------+ FV Mid   Full                                                        +---------+---------------+---------+-----------+----------+--------------+ FV DistalFull                                                        +---------+---------------+---------+-----------+----------+--------------+ PFV      Full                                                        +---------+---------------+---------+-----------+----------+--------------+ POP      Full  Yes      Yes                                 +---------+---------------+---------+-----------+----------+--------------+ PTV      Full                                                        +---------+---------------+---------+-----------+----------+--------------+ PERO     Full                                                        +---------+---------------+---------+-----------+----------+--------------+    +---------+---------------+---------+-----------+----------+--------------+ LEFT     CompressibilityPhasicitySpontaneityPropertiesThrombus Aging +---------+---------------+---------+-----------+----------+--------------+ CFV      Full           Yes      Yes                                 +---------+---------------+---------+-----------+----------+--------------+ SFJ      Full                                                        +---------+---------------+---------+-----------+----------+--------------+ FV Prox  Full                                                        +---------+---------------+---------+-----------+----------+--------------+ FV Mid   Full                                                        +---------+---------------+---------+-----------+----------+--------------+ FV DistalFull                                                        +---------+---------------+---------+-----------+----------+--------------+ PFV      Full                                                        +---------+---------------+---------+-----------+----------+--------------+ POP      Full           Yes      Yes                                 +---------+---------------+---------+-----------+----------+--------------+ PTV  Full                                                        +---------+---------------+---------+-----------+----------+--------------+ PERO     Full                                                        +---------+---------------+---------+-----------+----------+--------------+     Summary: RIGHT: - There is no evidence of deep vein thrombosis in the lower extremity.  - No cystic structure found in the popliteal fossa.  LEFT: - There is no evidence of deep vein thrombosis in the lower extremity.  - No cystic structure found in the popliteal fossa.  *See table(s) above for measurements and observations. Electronically signed  by Coral Else MD on 05/26/2021 at 10:23:44 PM.    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  Echo yesterday showed LVEF 45 to 50% with global hypokinesis.  There is significant right heart enlargement with RV systolic dysfunction and moderate pulmonary hypertension with RVSP of 51 mmHg.  A prior echo in March 2022 showed an RVSP of 10.8 mmHg, RV systolic function was normal at that time, suggesting that there may be acute RV strain -this seems, however out of proportion to the findings of small peripheral pulmonary embolus of the right lower lobe segmental pulmonary artery branch.  He is on heparin.  Troponins have been flat elevated. Switched to IV lasix  yesterday.  BNP only 61 - creatinine improved to 1.21 today. Will resume home oral lasix tomorrow.  Frequent PVC's -Improved - potassium normal. Plan for left heart cath on Monday to evaluate for coronary disease. Essential hypertension -Blood pressure improved - low dose aldactone added OSA -Continue CPAP nightly with oxygen therapy Dilated aortic root -Noted to be as large as 4.4 cm however measured 4.1 cm on echo, continue to monitor 6.  Cough/post-nasal drip  - has tessalon ordered  - Will provide Robitussin DM PRN for cough/drainage 7.  "Fevers"  - he is concerned about "fever" - was 100.86F overnight, 100F  Tmax yesterday - suspect this is inflammation d/t PE  resolution  No leukocytosis  Time Spent Directly with Patient:  I have spent a total of 35 minutes with the patient reviewing hospital notes, telemetry, EKGs, labs and examining the patient as well as establishing an assessment and plan that was discussed personally with the patient.  > 50% of time was spent in direct patient care.  Length of Stay:  LOS: 3 days   Chrystie Nose, MD, [facility], FACP  [place]  [facility]  Medical Director of the [facility] Lipid Disorders &  Cardiovascular Risk Reduction Clinic Diplomate of the American Board of Clinical Lipidology Attending Cardiologist  Direct Dial: [phone]  Fax: [phone]  Website:  [email or web address] Nicely Thorvald Orsino 05/28/2021, 10:39 AM

## 2021-05-28 NOTE — Progress Notes (Signed)
Mobility Specialist - Progress Note   05/28/21 1425  Mobility  Activity Refused mobility   Pt just finished washing. He reports he feels SOB and generally weak and would like to differ ambulation to tomorrow.   Mamie Levers Mobility Specialist Mobility Specialist Phone: [phone]

## 2021-05-28 NOTE — Progress Notes (Signed)
ANTICOAGULATION CONSULT NOTE  Pharmacy Consult for Heparin Indication: pulmonary embolus  Allergies  Allergen Reactions   Amlodipine Besylate Swelling    Leg swelling with 10mg  daily am dosing; decreased with BID 5mg  dosing   Aspirin Itching    Patient Measurements: Weight: 116.8 kg (257 lb 8 oz) Heparin Dosing Weight: 98.9kg  Vital Signs: Temp: 97.9 F (36.6 C) (06/19 0347) Temp Source: Oral (06/19 0347) BP: 148/96 (06/19 0347) Pulse Rate: 73 (06/19 0347)  Labs: Recent Labs    05/25/21 1020 05/25/21 1223 05/25/21 1818 05/25/21 2019 05/25/21 2200 05/26/21 0312 05/26/21 1625 05/27/21 0138 05/27/21 0948 05/27/21 1758 05/27/21 1947 05/28/21 0143  HGB 12.3*  --   --   --   --  13.2  --  12.4*  --   --   --  12.2*  HCT 36.4*  --   --   --   --  37.4*  --  36.5*  --   --   --  35.9*  PLT 193  --   --   --   --  178  --  187  --   --   --  207  APTT 31  --   --   --   --   --   --   --   --   --   --   --   LABPROT 13.8  --   --   --   --   --   --   --   --   --   --   --   INR 1.1  --   --   --   --   --   --   --   --   --   --   --   HEPARINUNFRC  --    < > 0.15*  --   --  0.47   < > 0.13* 0.35 0.47  --  0.76*  CREATININE 1.21  --   --   --   --  1.13   < > 1.41*  --   --  1.35* 1.21  TROPONINIHS 47*   < > 51* 53* 55*  --   --   --   --   --   --   --    < > = values in this interval not displayed.    Estimated Creatinine Clearance: 77.9 mL/min (by C-G formula based on SCr of 1.21 mg/dL).  Assessment: 65 y.o. male with PE, awaiting cath Monday, for heparin.  Heparin drip 2350 units/hr  and HL slightly supratherapeutic at 0.76. Hgb stable around 12, PLT 190-200s stable   Per nursing, no signs of bleeding, and labs were drawn from the opposite arm to heparin infusion. Will make a 1unit/kg/hr reduction in gtt rate.  Goal of Therapy:  Heparin level 0.3-0.7 units/ml Monitor platelets by anticoagulation protocol: Yes   Plan:  Decrease heparin to 2250 units/hr HL  in 6 hours Daily CBC, heparin level monitor for signs of bleeding  76, PharmD PGY1 Pharmacy Resident 05/28/2021 7:13 AM

## 2021-05-29 ENCOUNTER — Encounter (HOSPITAL_COMMUNITY)
Admission: EM | Disposition: A | Discharge status: Home / Self Care | Payer: Self-pay | Source: Home / Self Care | Attending: Family Medicine

## 2021-05-29 DIAGNOSIS — I77810 Thoracic aortic ectasia: Secondary | ICD-10-CM | POA: Diagnosis not present

## 2021-05-29 DIAGNOSIS — I509 Heart failure, unspecified: Secondary | ICD-10-CM

## 2021-05-29 DIAGNOSIS — R072 Precordial pain: Secondary | ICD-10-CM | POA: Diagnosis not present

## 2021-05-29 DIAGNOSIS — R778 Other specified abnormalities of plasma proteins: Secondary | ICD-10-CM | POA: Diagnosis present

## 2021-05-29 DIAGNOSIS — I10 Essential (primary) hypertension: Secondary | ICD-10-CM

## 2021-05-29 DIAGNOSIS — Z6835 Body mass index (BMI) 35.0-35.9, adult: Secondary | ICD-10-CM

## 2021-05-29 DIAGNOSIS — I2609 Other pulmonary embolism with acute cor pulmonale: Secondary | ICD-10-CM | POA: Diagnosis not present

## 2021-05-29 DIAGNOSIS — R0600 Dyspnea, unspecified: Secondary | ICD-10-CM | POA: Diagnosis not present

## 2021-05-29 HISTORY — PX: RIGHT/LEFT HEART CATH AND CORONARY ANGIOGRAPHY: CATH118266

## 2021-05-29 LAB — BASIC METABOLIC PANEL
Anion gap: 10 (ref 5–15)
BUN: 21 mg/dL (ref 8–23)
CO2: 21 mmol/L — ABNORMAL LOW (ref 22–32)
Calcium: 9.3 mg/dL (ref 8.9–10.3)
Chloride: 105 mmol/L (ref 98–111)
Creatinine, Ser: 1.28 mg/dL — ABNORMAL HIGH (ref 0.61–1.24)
GFR, Estimated: >60 mL/min (ref >60)
Glucose, Bld: 99 mg/dL (ref 70–99)
Potassium: 4 mmol/L (ref 3.5–5.1)
Sodium: 136 mmol/L (ref 135–145)

## 2021-05-29 LAB — CBC
HCT: 38.6 % — ABNORMAL LOW (ref 39.0–52.0)
Hemoglobin: 13 g/dL (ref 13.0–17.0)
MCH: 33.2 pg (ref 26.0–34.0)
MCHC: 33.7 g/dL (ref 30.0–36.0)
MCV: 98.7 fL (ref 80.0–100.0)
Platelets: 194 10*3/uL (ref 150–400)
RBC: 3.91 MIL/uL — ABNORMAL LOW (ref 4.22–5.81)
RDW: 15.3 % (ref 11.5–15.5)
WBC: 5.2 10*3/uL (ref 4.0–10.5)
nRBC: 0 % (ref 0.0–0.2)

## 2021-05-29 LAB — POCT I-STAT EG7
Acid-base deficit: 6 mmol/L — ABNORMAL HIGH (ref 0.0–2.0)
Bicarbonate: 18.3 mmol/L — ABNORMAL LOW (ref 20.0–28.0)
Calcium, Ion: 0.82 mmol/L — CL (ref 1.15–1.40)
HCT: 29 % — ABNORMAL LOW (ref 39.0–52.0)
Hemoglobin: 9.9 g/dL — ABNORMAL LOW (ref 13.0–17.0)
O2 Saturation: 71 %
Potassium: 3.2 mmol/L — ABNORMAL LOW (ref 3.5–5.1)
Sodium: 147 mmol/L — ABNORMAL HIGH (ref 135–145)
TCO2: 19 mmol/L — ABNORMAL LOW (ref 22–32)
pCO2, Ven: 30.9 mmHg — ABNORMAL LOW (ref 44.0–60.0)
pH, Ven: 7.382 (ref 7.250–7.430)
pO2, Ven: 37 mmHg (ref 32.0–45.0)

## 2021-05-29 LAB — POCT I-STAT 7, (LYTES, BLD GAS, ICA,H+H)
Acid-base deficit: 2 mmol/L (ref 0.0–2.0)
Bicarbonate: 22.2 mmol/L (ref 20.0–28.0)
Calcium, Ion: 1.22 mmol/L (ref 1.15–1.40)
HCT: 35 % — ABNORMAL LOW (ref 39.0–52.0)
Hemoglobin: 11.9 g/dL — ABNORMAL LOW (ref 13.0–17.0)
O2 Saturation: 99 %
Potassium: 4.3 mmol/L (ref 3.5–5.1)
Sodium: 138 mmol/L (ref 135–145)
TCO2: 23 mmol/L (ref 22–32)
pCO2 arterial: 33.4 mmHg (ref 32.0–48.0)
pH, Arterial: 7.43 (ref 7.350–7.450)
pO2, Arterial: 126 mmHg — ABNORMAL HIGH (ref 83.0–108.0)

## 2021-05-29 LAB — HEPARIN LEVEL (UNFRACTIONATED): Heparin Unfractionated: 0.73 IU/mL — ABNORMAL HIGH (ref 0.30–0.70)

## 2021-05-29 LAB — MAGNESIUM: Magnesium: 1.9 mg/dL (ref 1.7–2.4)

## 2021-05-29 SURGERY — RIGHT/LEFT HEART CATH AND CORONARY ANGIOGRAPHY
Anesthesia: LOCAL

## 2021-05-29 MED ORDER — LIDOCAINE HCL (PF) 1 % IJ SOLN
INTRAMUSCULAR | Status: DC | PRN
  Administered 2021-05-29: 2 mL

## 2021-05-29 MED ORDER — VERAPAMIL HCL 2.5 MG/ML IV SOLN
INTRAVENOUS | Status: DC | PRN
  Administered 2021-05-29: 10 mL via INTRA_ARTERIAL

## 2021-05-29 MED ORDER — SODIUM CHLORIDE 0.9% FLUSH: 3.0000 mL | INTRAVENOUS | Status: DC | PRN

## 2021-05-29 MED ORDER — FENTANYL CITRATE (PF) 100 MCG/2ML IJ SOLN
INTRAMUSCULAR | Status: DC | PRN
  Administered 2021-05-29: 25 ug via INTRAVENOUS

## 2021-05-29 MED ORDER — HEPARIN (PORCINE) 25000 UT/250ML-% IV SOLN
2250.0000 [IU]/h | INTRAVENOUS | Status: DC
  Administered 2021-05-29 – 2021-05-30 (×2): 2250 [IU]/h via INTRAVENOUS
  Filled 2021-05-29 (×2): qty 250

## 2021-05-29 MED ORDER — FENTANYL CITRATE (PF) 100 MCG/2ML IJ SOLN
INTRAMUSCULAR | Status: AC
  Filled 2021-05-29: qty 2

## 2021-05-29 MED ORDER — SODIUM CHLORIDE 0.9 % WEIGHT BASED INFUSION
1.0000 mL/kg/h | INTRAVENOUS | Status: DC
  Administered 2021-05-29: 1 mL/kg/h via INTRAVENOUS

## 2021-05-29 MED ORDER — HEPARIN SODIUM (PORCINE) 1000 UNIT/ML IJ SOLN
INTRAMUSCULAR | Status: DC | PRN
  Administered 2021-05-29: 6000 [IU] via INTRAVENOUS

## 2021-05-29 MED ORDER — MIDAZOLAM HCL 2 MG/2ML IJ SOLN
INTRAMUSCULAR | Status: DC | PRN
  Administered 2021-05-29: 1 mg via INTRAVENOUS

## 2021-05-29 MED ORDER — VERAPAMIL HCL 2.5 MG/ML IV SOLN
INTRAVENOUS | Status: AC
  Filled 2021-05-29: qty 2

## 2021-05-29 MED ORDER — LIDOCAINE HCL (PF) 1 % IJ SOLN
INTRAMUSCULAR | Status: AC
  Filled 2021-05-29: qty 30

## 2021-05-29 MED ORDER — LABETALOL HCL 5 MG/ML IV SOLN: 10.0000 mg | INTRAVENOUS | Status: AC | PRN

## 2021-05-29 MED ORDER — SODIUM CHLORIDE 0.9 % IV SOLN: 250.0000 mL | INTRAVENOUS | Status: DC | PRN

## 2021-05-29 MED ORDER — HEPARIN (PORCINE) IN NACL 1000-0.9 UT/500ML-% IV SOLN
INTRAVENOUS | Status: AC
  Filled 2021-05-29: qty 1000

## 2021-05-29 MED ORDER — SODIUM CHLORIDE 0.9 % IV SOLN: INTRAVENOUS | Status: AC

## 2021-05-29 MED ORDER — MIDAZOLAM HCL 2 MG/2ML IJ SOLN
INTRAMUSCULAR | Status: AC
  Filled 2021-05-29: qty 2

## 2021-05-29 MED ORDER — SODIUM CHLORIDE 0.9% FLUSH
3.0000 mL | Freq: Two times a day (BID) | INTRAVENOUS | Status: DC
  Administered 2021-05-30: 3 mL via INTRAVENOUS

## 2021-05-29 MED ORDER — HEPARIN (PORCINE) IN NACL 1000-0.9 UT/500ML-% IV SOLN
INTRAVENOUS | Status: DC | PRN
  Administered 2021-05-29 (×3): 500 mL

## 2021-05-29 MED ORDER — IOHEXOL 350 MG/ML SOLN
INTRAVENOUS | Status: DC | PRN
  Administered 2021-05-29: 70 mL via INTRA_ARTERIAL

## 2021-05-29 MED ORDER — DILTIAZEM HCL ER COATED BEADS 180 MG PO CP24: 180.0000 mg | ORAL_CAPSULE | Freq: Every day | ORAL | Status: DC

## 2021-05-29 MED ORDER — HEPARIN SODIUM (PORCINE) 1000 UNIT/ML IJ SOLN
INTRAMUSCULAR | Status: AC
  Filled 2021-05-29: qty 1

## 2021-05-29 MED ORDER — HYDRALAZINE HCL 20 MG/ML IJ SOLN: 10.0000 mg | INTRAMUSCULAR | Status: AC | PRN

## 2021-05-29 MED ORDER — SODIUM CHLORIDE 0.9 % WEIGHT BASED INFUSION
3.0000 mL/kg/h | INTRAVENOUS | Status: DC
  Administered 2021-05-29: 3 mL/kg/h via INTRAVENOUS

## 2021-05-29 MED ORDER — DILTIAZEM HCL ER COATED BEADS 180 MG PO CP24
180.0000 mg | ORAL_CAPSULE | Freq: Every day | ORAL | Status: DC
  Administered 2021-05-30: 180 mg via ORAL
  Filled 2021-05-29: qty 1

## 2021-05-29 SURGICAL SUPPLY — 12 items
CATH 5FR JL3.5 JR4 ANG PIG MP (CATHETERS) ×1 IMPLANT
CATH BALLN WEDGE 5F 110CM (CATHETERS) ×1 IMPLANT
CATH INFINITI 5FR AL1 (CATHETERS) ×1 IMPLANT
DEVICE RAD COMP TR BAND LRG (VASCULAR PRODUCTS) ×1 IMPLANT
GLIDESHEATH SLEND SS 6F .021 (SHEATH) ×1 IMPLANT
GUIDEWIRE INQWIRE 1.5J.035X260 (WIRE) IMPLANT
INQWIRE 1.5J .035X260CM (WIRE) ×2
KIT HEART LEFT (KITS) ×2 IMPLANT
PACK CARDIAC CATHETERIZATION (CUSTOM PROCEDURE TRAY) ×2 IMPLANT
SHEATH GLIDE SLENDER 4/5FR (SHEATH) ×1 IMPLANT
TRANSDUCER W/STOPCOCK (MISCELLANEOUS) ×2 IMPLANT
TUBING CIL FLEX 10 FLL-RA (TUBING) ×2 IMPLANT

## 2021-05-29 NOTE — Telephone Encounter (Signed)
Dr Mayford Knife saw patient today in hospital (cardiology).  Cardiac Cath scheduled for 1330 today.  Potassium normal on labs and creatinine improved.

## 2021-05-29 NOTE — Progress Notes (Signed)
ANTICOAGULATION CONSULT NOTE  Pharmacy Consult for Heparin Indication: pulmonary embolus 05/25/21   Allergies  Allergen Reactions   Amlodipine Besylate Swelling    Leg swelling with 10mg  daily am dosing; decreased with BID 5mg  dosing   Aspirin Itching    Patient Measurements: Weight: 118.2 kg (260 lb 9.3 oz) Heparin Dosing Weight: 98.9kg  Vital Signs: Temp: 98.5 F (36.9 C) (06/20 1202) Temp Source: Oral (06/20 1202) BP: 116/68 (06/20 1700) Pulse Rate: 62 (06/20 1700)  Labs: Recent Labs    05/27/21 0138 05/27/21 0948 05/28/21 0143 05/28/21 1435 05/28/21 1621 05/29/21 0257 05/29/21 1642 05/29/21 1646  HGB 12.4*  --  12.2*  --   --  13.0 9.9* 11.9*  HCT 36.5*  --  35.9*  --   --  38.6* 29.0* 35.0*  PLT 187  --  207  --   --  194  --   --   HEPARINUNFRC 0.13*   < > 0.76* 0.32  --  0.73*  --   --   CREATININE 1.41*   < > 1.21  --  1.57* 1.28*  --   --    < > = values in this interval not displayed.     Estimated Creatinine Clearance: 74.1 mL/min (A) (by C-G formula based on SCr of 1.28 mg/dL (H)).  Assessment: 65 y.o. male with small acute PE 05/25/21. No anticoagulation prior to admission. Pharmacy consulted for heparin.     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 OK to restart heparin 6 hours post sheath removal. H/H , plt stable.   Goal of Therapy:  Heparin level 0.3-0.7 units/ml Monitor platelets by anticoagulation protocol: Yes   Plan:  Restart heparin infusion at 2250 units/hr at 2300 Monitor daily HL, CBC/plt Monitor for signs/symptoms of bleeding  F/u oral anticoagulation plan    05/27/21, PharmD, BCPS, [facility] Clinical Pharmacist  Please check AMION for all [facility] Pharmacy phone numbers After 10:00 PM, call Main Pharmacy [phone]

## 2021-05-29 NOTE — Progress Notes (Signed)
ANTICOAGULATION CONSULT NOTE  Pharmacy Consult for Heparin Indication: pulmonary embolus  Allergies  Allergen Reactions   Amlodipine Besylate Swelling    Leg swelling with 10mg  daily am dosing; decreased with BID 5mg  dosing   Aspirin Itching    Patient Measurements: Weight: 118.2 kg (260 lb 9.3 oz) Heparin Dosing Weight: 98.9kg  Vital Signs: Temp: 97.8 F (36.6 C) (06/20 0744) Temp Source: Oral (06/20 0744) BP: 142/90 (06/20 0744) Pulse Rate: 60 (06/20 0744)  Labs: Recent Labs    05/27/21 0138 05/27/21 0948 05/28/21 0143 05/28/21 1435 05/28/21 1621 05/29/21 0257  HGB 12.4*  --  12.2*  --   --  13.0  HCT 36.5*  --  35.9*  --   --  38.6*  PLT 187  --  207  --   --  194  HEPARINUNFRC 0.13*   < > 0.76* 0.32  --  0.73*  CREATININE 1.41*   < > 1.21  --  1.57* 1.28*   < > = values in this interval not displayed.     Estimated Creatinine Clearance: 74.1 mL/min (A) (by C-G formula based on SCr of 1.28 mg/dL (H)).  Assessment: 65 y.o. male with PE, awaiting cath Monday scheduled for 1330. Pharmacy consulted to dose heparin. Heparin drip currently running at 2300 units/hr CBC: Hgb 13, Plt 194 (stable) HL: 0.73 supratherapeutic   Spoke with nursing who denies signs/sx of bleeding and confirms no issues with infusion or line  Goal of Therapy:  Heparin level 0.3-0.7 units/ml Monitor platelets by anticoagulation protocol: Yes   Plan:  Decrease heparin infusion to 2250 units / hr F/u cath results  F/u s/sx bleeding, CBC

## 2021-05-29 NOTE — Progress Notes (Signed)
 DAILY PROGRESS NOTE   Patient Name: Ryan Glass Date of Encounter: 05/29/2021 Cardiologist: Alioune Hodgkin, MD  Chief Complaint   Afebrile for 24 hours  Patient Profile   Ryan Glass is a 65 y.o. 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 Lilland and found to have acute PE with RV failure and mildly reduced LVF with EF 45-50%.   Subjective   Had fever yesterday which has resolved.  Denies any chest pain or pressure and SOB improved,.   Objective   Vitals:   05/28/21 2333 05/29/21 0434 05/29/21 0744 05/29/21 0845  BP: 133/89 (!) 137/95 (!) 142/90   Pulse: 94 65 60   Resp: 12 14 (!) 21   Temp: 98.5 F (36.9 C) 98.5 F (36.9 C) 97.8 F (36.6 C)   TempSrc: Oral Oral Oral   SpO2:  95% 99% 93%  Weight:  118.2 kg      Intake/Output Summary (Last 24 hours) at 05/29/2021 0931 Last data filed at 05/29/2021 0443 Gross per 24 hour  Intake --  Output 300 ml  Net -300 ml    Filed Weights   05/28/21 0347 05/29/21 0434  Weight: 116.8 kg 118.2 kg    Physical Exam  GEN: Well nourished, well developed in no acute distress HEENT: Normal NECK: No JVD; No carotid bruits LYMPHATICS: No lymphadenopathy CARDIAC:RRR, no murmurs, rubs, gallops RESPIRATORY:  Clear to auscultation without rales, wheezing or rhonchi  ABDOMEN: Soft, non-tender, non-distended MUSCULOSKELETAL:  No edema; No deformity  SKIN: Warm and dry NEUROLOGIC:  Alert and oriented x 3 PSYCHIATRIC:  Normal affect   Inpatient Medications    Scheduled Meds:  atorvastatin  40 mg Oral Daily   diltiazem  180 mg Oral Daily   fluticasone  1 spray Each Nare BID   fluticasone  furoate-vilanterol  1 puff Inhalation Daily   folic acid  1 mg Oral Daily   furosemide  40 mg Oral BID   gabapentin  400 mg Oral TID   hydrALAZINE  100 mg Oral TID   loratadine  10 mg Oral Daily   losartan  100 mg Oral Daily   magnesium oxide  400 mg Oral Daily   nebivolol  10 mg Oral QHS   potassium chloride  20 mEq Oral TID   predniSONE  10 mg Oral Daily   sodium chloride flush  3 mL Intravenous Q12H   spironolactone  12.5 mg Oral Daily    Continuous Infusions:  sodium chloride     sodium chloride 1 mL/kg/hr (05/29/21 0759)   heparin 2,250 Units/hr (05/29/21 0904)    PRN Meds: sodium chloride, albuterol, benzonatate, guaiFENesin-dextromethorphan, sodium chloride flush   Labs   Results for orders placed or performed during the hospital encounter of 05/25/21 (from the past 48 hour(s))  Heparin level (unfractionated)     Status: None   Collection Time: 05/27/21  9:48 AM  Result Value Ref Range   Heparin Unfractionated 0.35 0.30 - 0.70 IU/mL    Comment: (NOTE) The clinical reportable range upper limit is being lowered to >1.10 to align with the FDA approved guidance for the current laboratory assay.  If heparin results are below expected values,   and patient dosage has  been confirmed, suggest follow up testing of antithrombin III levels. Performed at [facility], [address] [place], [place] [address]   Heparin level (unfractionated)     Status: None   Collection Time: 05/27/21  5:58 PM  Result Value Ref Range   Heparin Unfractionated 0.47 0.30 - 0.70 IU/mL    Comment: (NOTE) The clinical reportable range upper limit is being lowered to >1.10 to align with the FDA approved guidance for the current laboratory assay.  If heparin results are below expected values, and patient dosage has  been confirmed, suggest follow up testing of antithrombin III levels. Performed at [facility], [address] [place], [place] [address]   Basic metabolic panel      Status: Abnormal   Collection Time: 05/27/21  7:47 PM  Result Value Ref Range   Sodium 133 (L) 135 - 145 mmol/L   Potassium 4.0 3.5 - 5.1 mmol/L   Chloride 103 98 - 111 mmol/L   CO2 21 (L) 22 - 32 mmol/L   Glucose, Bld 120 (H) 70 - 99 mg/dL    Comment: Glucose reference range applies only to samples taken after fasting for at least 8 hours.   BUN 19 8 - 23 mg/dL   Creatinine, Ser 1.35 (H) 0.61 - 1.24 mg/dL   Calcium 8.7 (L) 8.9 - 10.3 mg/dL   GFR, Estimated 58 (L) >60 mL/min    Comment: (NOTE) Calculated using the CKD-EPI Creatinine Equation (2021)    Anion gap 9 5 - 15    Comment: Performed at [facility], [address] [place], [place] [address]  CBC     Status: Abnormal   Collection Time: 05/28/21  1:43 AM  Result Value Ref Range   WBC 4.9 4.0 - 10.5 K/uL   RBC 3.68 (L) 4.22 - 5.81 MIL/uL   Hemoglobin 12.2 (L) 13.0 - 17.0 g/dL   HCT 35.9 (L) 39.0 - 52.0 %   MCV 97.6 80.0 - 100.0 fL   MCH 33.2 26.0 - 34.0 pg   MCHC 34.0 30.0 - 36.0 g/dL   RDW 15.2 11.5 - 15.5 %   Platelets 207 150 - 400 K/uL   nRBC 0.0 0.0 - 0.2 %    Comment: Performed at [facility], [address] [place], [place] [address]  Heparin level (unfractionated)     Status: Abnormal   Collection Time: 05/28/21  1:43 AM  Result Value Ref Range   Heparin Unfractionated 0.76 (H) 0.30 - 0.70 IU/mL    Comment: (NOTE) The clinical reportable range upper limit is being lowered to >1.10 to align with the FDA approved guidance for the current laboratory assay.  If heparin results are below expected values, and patient dosage has  been confirmed, suggest follow up testing of antithrombin III levels. Performed at [facility], [address] [place], [place] [address]   Magnesium     Status: None   Collection Time: 05/28/21  1:43 AM  Result Value Ref Range   Magnesium 1.9 1.7 - 2.4 mg/dL    Comment: Performed at [facility], [address] [place], [place] [address]  Basic  metabolic panel     Status: Abnormal   Collection Time: 05/28/21  1:43 AM  Result Value Ref Range   Sodium 134 (L) 135 - 145 mmol/L   Potassium 3.6 3.5 - 5.1 mmol/L   Chloride 102 98 - 111 mmol/L     CO2 23 22 - 32 mmol/L   Glucose, Bld 96 70 - 99 mg/dL    Comment: Glucose reference range applies only to samples taken after fasting for at least 8 hours.   BUN 15 8 - 23 mg/dL   Creatinine, Ser 1.21 0.61 - 1.24 mg/dL   Calcium 8.8 (L) 8.9 - 10.3 mg/dL   GFR, Estimated >60 >60 mL/min    Comment: (NOTE) Calculated using the CKD-EPI Creatinine Equation (2021)    Anion gap 9 5 - 15    Comment: Performed at [facility], [address] [place], [place] [address]  Brain natriuretic peptide     Status: None   Collection Time: 05/28/21  1:43 AM  Result Value Ref Range   B Natriuretic Peptide 61.4 0.0 - 100.0 pg/mL    Comment: Performed at [facility], [address] [place], [place] [address]  Urinalysis, Routine w reflex microscopic Urine, Clean Catch     Status: None   Collection Time: 05/28/21 11:32 AM  Result Value Ref Range   Color, Urine YELLOW YELLOW   APPearance CLEAR CLEAR   Specific Gravity, Urine 1.010 1.005 - 1.030   pH 7.0 5.0 - 8.0   Glucose, UA NEGATIVE NEGATIVE mg/dL   Hgb urine dipstick NEGATIVE NEGATIVE   Bilirubin Urine NEGATIVE NEGATIVE   Ketones, ur NEGATIVE NEGATIVE mg/dL   Protein, ur NEGATIVE NEGATIVE mg/dL   Nitrite NEGATIVE NEGATIVE   Leukocytes,Ua NEGATIVE NEGATIVE    Comment: Microscopic not done on urines with negative protein, blood, leukocytes, nitrite, or glucose < 500 mg/dL. Performed at [facility], [address] [place], [place] [address]   Culture, blood (single)     Status: None (Preliminary result)   Collection Time: 05/28/21 11:45 AM   Specimen: BLOOD  Result Value Ref Range   Specimen Description BLOOD LEFT ANTECUBITAL    Special Requests      BOTTLES DRAWN AEROBIC ONLY Blood Culture adequate volume   Culture      NO  GROWTH < 24 HOURS Performed at [facility], [address] [place], [place] [address]    Report Status PENDING   Heparin level (unfractionated)     Status: None   Collection Time: 05/28/21  2:35 PM  Result Value Ref Range   Heparin Unfractionated 0.32 0.30 - 0.70 IU/mL    Comment: (NOTE) The clinical reportable range upper limit is being lowered to >1.10 to align with the FDA approved guidance for the current laboratory assay.  If heparin results are below expected values, and patient dosage has  been confirmed, suggest follow up testing of antithrombin III levels. Performed at [facility], [address] , [place] [address]   Basic metabolic panel     Status: Abnormal   Collection Time: 05/28/21  4:21 PM  Result Value Ref Range   Sodium 131 (L) 135 - 145 mmol/L   Potassium 4.2 3.5 - 5.1 mmol/L   Chloride 102 98 - 111 mmol/L   CO2 21 (L) 22 - 32 mmol/L   Glucose, Bld 135 (H) 70 - 99 mg/dL    Comment: Glucose reference range applies only to samples taken after fasting for at least 8 hours.   BUN 20 8 - 23 mg/dL   Creatinine, Ser 1.57 (H) 0.61 - 1.24 mg/dL   Calcium 8.7 (L) 8.9 - 10.3 mg/dL   GFR, Estimated 49 (L) >60 mL/min    Comment: (NOTE) Calculated using the   CKD-EPI Creatinine Equation (2021)    Anion gap 8 5 - 15    Comment: Performed at [facility], [address] [place], [place] [address]  CBC     Status: Abnormal   Collection Time: 05/29/21  2:57 AM  Result Value Ref Range   WBC 5.2 4.0 - 10.5 K/uL   RBC 3.91 (L) 4.22 - 5.81 MIL/uL   Hemoglobin 13.0 13.0 - 17.0 g/dL   HCT 38.6 (L) 39.0 - 52.0 %   MCV 98.7 80.0 - 100.0 fL   MCH 33.2 26.0 - 34.0 pg   MCHC 33.7 30.0 - 36.0 g/dL   RDW 15.3 11.5 - 15.5 %   Platelets 194 150 - 400 K/uL   nRBC 0.0 0.0 - 0.2 %    Comment: Performed at [facility], [address] [place], [place] [address]  Heparin level (unfractionated)     Status: Abnormal   Collection Time: 05/29/21  2:57 AM   Result Value Ref Range   Heparin Unfractionated 0.73 (H) 0.30 - 0.70 IU/mL    Comment: (NOTE) The clinical reportable range upper limit is being lowered to >1.10 to align with the FDA approved guidance for the current laboratory assay.  If heparin results are below expected values, and patient dosage has  been confirmed, suggest follow up testing of antithrombin III levels. Performed at [facility], [address] [place], [place] [address]   Magnesium     Status: None   Collection Time: 05/29/21  2:57 AM  Result Value Ref Range   Magnesium 1.9 1.7 - 2.4 mg/dL    Comment: Performed at [facility], [address] [place], [place] [address]  Basic metabolic panel     Status: Abnormal   Collection Time: 05/29/21  2:57 AM  Result Value Ref Range   Sodium 136 135 - 145 mmol/L   Potassium 4.0 3.5 - 5.1 mmol/L   Chloride 105 98 - 111 mmol/L   CO2 21 (L) 22 - 32 mmol/L   Glucose, Bld 99 70 - 99 mg/dL    Comment: Glucose reference range applies only to samples taken after fasting for at least 8 hours.   BUN 21 8 - 23 mg/dL   Creatinine, Ser 1.28 (H) 0.61 - 1.24 mg/dL   Calcium 9.3 8.9 - 10.3 mg/dL   GFR, Estimated >60 >60 mL/min    Comment: (NOTE) Calculated using the CKD-EPI Creatinine Equation (2021)    Anion gap 10 5 - 15    Comment: Performed at [facility], [address] [place], [place] [address]    ECG   No new EKG to review  Telemetry   NSR with PVCs - Personally Reviewed  Radiology    DG Chest 2 View  Result Date: 05/28/2021 CLINICAL DATA:  Dyspnea on exertion EXAM: CHEST - 2 VIEW COMPARISON:  05/24/2021 FINDINGS: Cardiac shadow is enlarged. Mild central vascular congestion is noted. No focal infiltrate or effusion is seen. No bony abnormality is noted. IMPRESSION: Changes of mild CHF. Electronically Signed   By: Mark  Lukens M.D.   On: 05/28/2021 15:30    Cardiac Studies   See echo above  Assessment/Plan:   Pulmonary embolism/RV  failure -He is reporting worsening dyspnea on exertion.   -Echo this admission showed LVEF 45 to 50% with global hypokinesis and significant right heart enlargement with RV systolic dysfunction and moderate pulmonary hypertension with RVSP of 51 mmHg.   -A prior   echo in March 2022 showed an RVSP of 10.8 mmHg, RV systolic function was normal at that time, suggesting that there may be acute RV strain which seems out of proportion to the findings of small peripheral pulmonary embolus of the right lower lobe segmental pulmonary artery branch.   -trops mildly elevated but flat and likely related to PE -He is on heparin.   -BNP only 61 but may be inaccurate in setting of obesity -given IV Lasix yesterday but I&O's are incomplete>>net neg 1.87L since admit -SCr improved to 1.28 today -he does not appear volume overloaded on exam today and will change back to Lasix 40mg BID (home dose) -he is NPO for right and left heart cath today  Frequent PVC's -potassium normal.  -seen by EP outpt and recommended continuing BB -now with mildly reduced LVF ? PVC induced DCM but need to rule out CAD (recent nuclear stress test with no ischemia) -plan for right and left heart cath today to rule out CAD -recent nuclear stress test with no ischemia but given new LV and RV dysfunction  Essential hypertension -Blood pressure improved  -stop Cardizem due to LV dysfunction -continue Bystolic 10mg daily, Hydralazine 100mg TID, Losartan 100mg daily -continue spiro 12.5mg daily -SCr stable at 1.28  OSA -Continue CPAP nightly with oxygen therapy  Dilated aortic root -Noted to be as large as 4.4 cm however measured 4.1 cm on echo, continue to monitor  6.  Cough/post-nasal drip - has tessalon ordered - Will provide Robitussin DM PRN for cough/drainage  7.  "Fevers" - he is concerned about "fever" - was 100.6F yesterday but now resolved - suspect this is inflammation d/t PE resolution  - No leukocytosis  Time  Spent Directly with Patient:  I have spent a total of 35 minutes with patient reviewing 2D echo , telemetry, EKGs, labs and examining patient as well as establishing an assessment and plan that was discussed with the patient.  > 50% of time was spent in direct patient care.     Length of Stay:  LOS: 4 days   Raeanne Deschler, MD FACC [facility] 05/29/2021, 9:31 AM   

## 2021-05-29 NOTE — Progress Notes (Addendum)
Cardiac Cath 05/29/2021 reviewed as below:  Conclusion    Prox RCA lesion is 20% stenosed. Prox Cx to Mid Cx lesion is 20% stenosed. Mid LAD lesion is 30% stenosed.   Mild non-obstructive CAD Normal right and left heart pressures   Recommendations: Medical management of mild CAD. Will resume IV heparin 6 hours post sheath pull given acute PE. Would transition to Eliquis or Xarelto tomorrow.  His SOB is far out of proportion to his very small PE.  Minimal CAD on cath and normal filling pressures on RHC with no pulmonary HTN.  RAP , PAP 25/12mmHg with mean PAP , mean PCWP and LVEDP and therefore not volume overloaded.   I personally reviewed the echo images and I feel that his RV is mildly dilated but RVF is normal and LVF is also normal.  I do not think his SOB is cardiac related.  Agree with placing on DOAC for PE.  Recommend Pulmonary consult. Will restart Cardizem CD 180mg  daily (home dose that was stopped after echo showed mild LV dysfunction but LVF is actually normal).  Stop lasix as he is not volume overloaded and restart Chlorthalidone 25mg  daily for HTN  [facility] will sign off.   Medication Recommendations:  Atorvastatin 40mg  daily, Chlorthalidone 25mg  daily, Cardizem CD 180mg  daily,Hydralazine 100mg  TID, Losartan 100mg  daily, Bystolic 10mg  daily, spiro 12.5mg  daily and Kdur (dose to be determined at discharge) Other recommendations (labs, testing, etc):  none Follow up as an outpatient:  2-3 weeks in Cardiology office for followup of BP

## 2021-05-29 NOTE — Interval H&P Note (Signed)
History and Physical Interval Note:  05/29/2021 4:02 PM  Ryan Glass  has presented today for surgery, with the diagnosis of chest pain,shortness of breath.  The various methods of treatment have been discussed with the patient and family. After consideration of risks, benefits and other options for treatment, the patient has consented to  Procedure(s): LEFT HEART CATH AND CORONARY ANGIOGRAPHY (N/A) as a surgical intervention.  The patient's history has been reviewed, patient examined, no change in status, stable for surgery.  I have reviewed the patient's chart and labs.  Questions were answered to the patient's satisfaction.    Cath Lab Visit (complete for each Cath Lab visit)  Clinical Evaluation Leading to the Procedure:   ACS: Yes.    Non-ACS:    Anginal Classification: CCS III  Anti-ischemic medical therapy: No Therapy  Non-Invasive Test Results: No non-invasive testing performed  Prior CABG: No previous CABG        Verne Carrow

## 2021-05-29 NOTE — H&P (View-Only) (Signed)
DAILY PROGRESS NOTE   Patient Name: Ryan Glass Date of Encounter: 05/29/2021 Cardiologist: [name], MD  Chief Complaint   Afebrile for 24 hours  Patient Profile   Ryan Glass is a 65 y.o. 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 Clayborne Artist and found to have acute PE with RV failure and mildly reduced LVF with EF 45-50%.   Subjective   Had fever yesterday which has resolved.  Denies any chest pain or pressure and SOB improved,.   Objective   Vitals:   05/28/21 2333 05/29/21 0434 05/29/21 0744 05/29/21 0845  BP: 133/89 (!) 137/95 (!) 142/90   Pulse: 94 65 60   Resp: 12 14 (!) 21   Temp: 98.5 F (36.9 C) 98.5 F (36.9 C) 97.8 F (36.6 C)   TempSrc: Oral Oral Oral   SpO2:  95% 99% 93%  Weight:  118.2 kg      Intake/Output Summary (Last 24 hours) at 05/29/2021 0931 Last data filed at 05/29/2021 0443 Gross per 24 hour  Intake --  Output 300 ml  Net -300 ml    Filed Weights   05/28/21 0347 05/29/21 0434  Weight: 116.8 kg 118.2 kg    Physical Exam  GEN: Well nourished, well developed in no acute distress HEENT: Normal NECK: No JVD; No carotid bruits LYMPHATICS: No lymphadenopathy CARDIAC:RRR, no murmurs, rubs, gallops RESPIRATORY:  Clear to auscultation without rales, wheezing or rhonchi  ABDOMEN: Soft, non-tender, non-distended MUSCULOSKELETAL:  No edema; No deformity  SKIN: Warm and dry NEUROLOGIC:  Alert and oriented x 3 PSYCHIATRIC:  Normal affect   Inpatient Medications    Scheduled Meds:  atorvastatin  40 mg Oral Daily   diltiazem  180 mg Oral Daily   fluticasone  1 spray Each Nare BID   fluticasone  furoate-vilanterol  1 puff Inhalation Daily   folic acid  1 mg Oral Daily   furosemide  40 mg Oral BID   gabapentin  400 mg Oral TID   hydrALAZINE  100 mg Oral TID   loratadine  10 mg Oral Daily   losartan  100 mg Oral Daily   magnesium oxide  400 mg Oral Daily   nebivolol  10 mg Oral QHS   potassium chloride  20 mEq Oral TID   predniSONE  10 mg Oral Daily   sodium chloride flush  3 mL Intravenous Q12H   spironolactone  12.5 mg Oral Daily    Continuous Infusions:  sodium chloride     sodium chloride 1 mL/kg/hr (05/29/21 0759)   heparin 2,250 Units/hr (05/29/21 0904)    PRN Meds: sodium chloride, albuterol, benzonatate, guaiFENesin-dextromethorphan, sodium chloride flush   Labs   Results for orders placed or performed during the hospital encounter of 05/25/21 (from the past 48 hour(s))  Heparin level (unfractionated)     Status: None   Collection Time: 05/27/21  9:48 AM  Result Value Ref Range   Heparin Unfractionated 0.35 0.30 - 0.70 IU/mL    Comment: (NOTE) The clinical reportable range upper limit is being lowered to >1.10 to align with the FDA approved guidance for the current laboratory assay.  If heparin results are below expected values,  and patient dosage has  been confirmed, suggest follow up testing of antithrombin III levels. Performed at [facility], [address] [place], [place] [address]   Heparin level (unfractionated)     Status: None   Collection Time: 05/27/21  5:58 PM  Result Value Ref Range   Heparin Unfractionated 0.47 0.30 - 0.70 IU/mL    Comment: (NOTE) The clinical reportable range upper limit is being lowered to >1.10 to align with the FDA approved guidance for the current laboratory assay.  If heparin results are below expected values, and patient dosage has  been confirmed, suggest follow up testing of antithrombin III levels. Performed at [facility], [address], [place], [place] [address]   Basic metabolic panel      Status: Abnormal   Collection Time: 05/27/21  7:47 PM  Result Value Ref Range   Sodium 133 (L) 135 - 145 mmol/L   Potassium 4.0 3.5 - 5.1 mmol/L   Chloride 103 98 - 111 mmol/L   CO2 21 (L) 22 - 32 mmol/L   Glucose, Bld 120 (H) 70 - 99 mg/dL    Comment: Glucose reference range applies only to samples taken after fasting for at least 8 hours.   BUN 19 8 - 23 mg/dL   Creatinine, Ser 5.46 (H) 0.61 - 1.24 mg/dL   Calcium 8.7 (L) 8.9 - 10.3 mg/dL   GFR, Estimated 58 (L) >60 mL/min    Comment: (NOTE) Calculated using the CKD-EPI Creatinine Equation (2021)    Anion gap 9 5 - 15    Comment: Performed at [facility], [address], [place], [place] [address]  CBC     Status: Abnormal   Collection Time: 05/28/21  1:43 AM  Result Value Ref Range   WBC 4.9 4.0 - 10.5 K/uL   RBC 3.68 (L) 4.22 - 5.81 MIL/uL   Hemoglobin 12.2 (L) 13.0 - 17.0 g/dL   HCT 00.9 (L) 38.1 - 82.9 %   MCV 97.6 80.0 - 100.0 fL   MCH 33.2 26.0 - 34.0 pg   MCHC 34.0 30.0 - 36.0 g/dL   RDW 93.7 16.9 - 67.8 %   Platelets 207 150 - 400 K/uL   nRBC 0.0 0.0 - 0.2 %    Comment: Performed at [facility], [address] [place], [place] [address]  Heparin level (unfractionated)     Status: Abnormal   Collection Time: 05/28/21  1:43 AM  Result Value Ref Range   Heparin Unfractionated 0.76 (H) 0.30 - 0.70 IU/mL    Comment: (NOTE) The clinical reportable range upper limit is being lowered to >1.10 to align with the FDA approved guidance for the current laboratory assay.  If heparin results are below expected values, and patient dosage has  been confirmed, suggest follow up testing of antithrombin III levels. Performed at [facility], [address], [place], [place] [address]   Magnesium     Status: None   Collection Time: 05/28/21  1:43 AM  Result Value Ref Range   Magnesium 1.9 1.7 - 2.4 mg/dL    Comment: Performed at [facility], [address] [place], [place] [address]  Basic  metabolic panel     Status: Abnormal   Collection Time: 05/28/21  1:43 AM  Result Value Ref Range   Sodium 134 (L) 135 - 145 mmol/L   Potassium 3.6 3.5 - 5.1 mmol/L   Chloride 102 98 - 111 mmol/L  CO2 23 22 - 32 mmol/L   Glucose, Bld 96 70 - 99 mg/dL    Comment: Glucose reference range applies only to samples taken after fasting for at least 8 hours.   BUN 15 8 - 23 mg/dL   Creatinine, Ser 1.76 0.61 - 1.24 mg/dL   Calcium 8.8 (L) 8.9 - 10.3 mg/dL   GFR, Estimated >16 >07 mL/min    Comment: (NOTE) Calculated using the CKD-EPI Creatinine Equation (2021)    Anion gap 9 5 - 15    Comment: Performed at [facility], [address] [place], [place] [address]  Brain natriuretic peptide     Status: None   Collection Time: 05/28/21  1:43 AM  Result Value Ref Range   B Natriuretic Peptide 61.4 0.0 - 100.0 pg/mL    Comment: Performed at [facility], [address] [place], [place] [address]  Urinalysis, Routine w reflex microscopic Urine, Clean Catch     Status: None   Collection Time: 05/28/21 11:32 AM  Result Value Ref Range   Color, Urine YELLOW YELLOW   APPearance CLEAR CLEAR   Specific Gravity, Urine 1.010 1.005 - 1.030   pH 7.0 5.0 - 8.0   Glucose, UA NEGATIVE NEGATIVE mg/dL   Hgb urine dipstick NEGATIVE NEGATIVE   Bilirubin Urine NEGATIVE NEGATIVE   Ketones, ur NEGATIVE NEGATIVE mg/dL   Protein, ur NEGATIVE NEGATIVE mg/dL   Nitrite NEGATIVE NEGATIVE   Leukocytes,Ua NEGATIVE NEGATIVE    Comment: Microscopic not done on urines with negative protein, blood, leukocytes, nitrite, or glucose < 500 mg/dL. Performed at [facility], [address], [place], [place] [address]   Culture, blood (single)     Status: None (Preliminary result)   Collection Time: 05/28/21 11:45 AM   Specimen: BLOOD  Result Value Ref Range   Specimen Description BLOOD LEFT ANTECUBITAL    Special Requests      BOTTLES DRAWN AEROBIC ONLY Blood Culture adequate volume   Culture      NO  GROWTH < 24 HOURS Performed at [facility], [address] [place], [place] [address]    Report Status PENDING   Heparin level (unfractionated)     Status: None   Collection Time: 05/28/21  2:35 PM  Result Value Ref Range   Heparin Unfractionated 0.32 0.30 - 0.70 IU/mL    Comment: (NOTE) The clinical reportable range upper limit is being lowered to >1.10 to align with the FDA approved guidance for the current laboratory assay.  If heparin results are below expected values, and patient dosage has  been confirmed, suggest follow up testing of antithrombin III levels. Performed at [facility], [address] [place], [place] [address]   Basic metabolic panel     Status: Abnormal   Collection Time: 05/28/21  4:21 PM  Result Value Ref Range   Sodium 131 (L) 135 - 145 mmol/L   Potassium 4.2 3.5 - 5.1 mmol/L   Chloride 102 98 - 111 mmol/L   CO2 21 (L) 22 - 32 mmol/L   Glucose, Bld 135 (H) 70 - 99 mg/dL    Comment: Glucose reference range applies only to samples taken after fasting for at least 8 hours.   BUN 20 8 - 23 mg/dL   Creatinine, Ser 2.99 (H) 0.61 - 1.24 mg/dL   Calcium 8.7 (L) 8.9 - 10.3 mg/dL   GFR, Estimated 49 (L) >60 mL/min    Comment: (NOTE) Calculated using the  CKD-EPI Creatinine Equation (2021)    Anion gap 8 5 - 15    Comment: Performed at [facility], [address], [place], [place] [address]  CBC     Status: Abnormal   Collection Time: 05/29/21  2:57 AM  Result Value Ref Range   WBC 5.2 4.0 - 10.5 K/uL   RBC 3.91 (L) 4.22 - 5.81 MIL/uL   Hemoglobin 13.0 13.0 - 17.0 g/dL   HCT 81.1 (L) 91.4 - 78.2 %   MCV 98.7 80.0 - 100.0 fL   MCH 33.2 26.0 - 34.0 pg   MCHC 33.7 30.0 - 36.0 g/dL   RDW 95.6 21.3 - 08.6 %   Platelets 194 150 - 400 K/uL   nRBC 0.0 0.0 - 0.2 %    Comment: Performed at [facility], [address] [place], [place] [address]  Heparin level (unfractionated)     Status: Abnormal   Collection Time: 05/29/21  2:57 AM   Result Value Ref Range   Heparin Unfractionated 0.73 (H) 0.30 - 0.70 IU/mL    Comment: (NOTE) The clinical reportable range upper limit is being lowered to >1.10 to align with the FDA approved guidance for the current laboratory assay.  If heparin results are below expected values, and patient dosage has  been confirmed, suggest follow up testing of antithrombin III levels. Performed at [facility], [address], [place], [place] [address]   Magnesium     Status: None   Collection Time: 05/29/21  2:57 AM  Result Value Ref Range   Magnesium 1.9 1.7 - 2.4 mg/dL    Comment: Performed at [facility], [address] [place], [place] [address]  Basic metabolic panel     Status: Abnormal   Collection Time: 05/29/21  2:57 AM  Result Value Ref Range   Sodium 136 135 - 145 mmol/L   Potassium 4.0 3.5 - 5.1 mmol/L   Chloride 105 98 - 111 mmol/L   CO2 21 (L) 22 - 32 mmol/L   Glucose, Bld 99 70 - 99 mg/dL    Comment: Glucose reference range applies only to samples taken after fasting for at least 8 hours.   BUN 21 8 - 23 mg/dL   Creatinine, Ser 2.44 (H) 0.61 - 1.24 mg/dL   Calcium 9.3 8.9 - 01.0 mg/dL   GFR, Estimated >27 >25 mL/min    Comment: (NOTE) Calculated using the CKD-EPI Creatinine Equation (2021)    Anion gap 10 5 - 15    Comment: Performed at [facility], [address] [place], [place] [address]    ECG   No new EKG to review  Telemetry   NSR with PVCs - Personally Reviewed  Radiology    DG Chest 2 View  Result Date: 05/28/2021 CLINICAL DATA:  Dyspnea on exertion EXAM: CHEST - 2 VIEW COMPARISON:  05/24/2021 FINDINGS: Cardiac shadow is enlarged. Mild central vascular congestion is noted. No focal infiltrate or effusion is seen. No bony abnormality is noted. IMPRESSION: Changes of mild CHF. Electronically Signed   By: Alcide Clever M.D.   On: 05/28/2021 15:30    Cardiac Studies   See echo above  Assessment/Plan:   Pulmonary embolism/RV  failure -He is reporting worsening dyspnea on exertion.   -Echo this admission showed LVEF 45 to 50% with global hypokinesis and significant right heart enlargement with RV systolic dysfunction and moderate pulmonary hypertension with RVSP of 51 mmHg.   -A prior  echo in March 2022 showed an RVSP of 10.8 mmHg, RV systolic function was normal at that time, suggesting that there may be acute RV strain which seems out of proportion to the findings of small peripheral pulmonary embolus of the right lower lobe segmental pulmonary artery branch.   -trops mildly elevated but flat and likely related to PE -He is on heparin.   -BNP only 61 but may be inaccurate in setting of obesity -given IV Lasix yesterday but I&O's are incomplete>>net neg 1.87L since admit -SCr improved to 1.28 today -he does not appear volume overloaded on exam today and will change back to Lasix 40mg  BID (home dose) -he is NPO for right and left heart cath today  Frequent PVC's -potassium normal.  -seen by EP outpt and recommended continuing BB -now with mildly reduced LVF ? PVC induced DCM but need to rule out CAD (recent nuclear stress test with no ischemia) -plan for right and left heart cath today to rule out CAD -recent nuclear stress test with no ischemia but given new LV and RV dysfunction  Essential hypertension -Blood pressure improved  -stop Cardizem due to LV dysfunction -continue Bystolic 10mg  daily, Hydralazine 100mg  TID, Losartan 100mg  daily -continue spiro 12.5mg  daily -SCr stable at 1.28  OSA -Continue CPAP nightly with oxygen therapy  Dilated aortic root -Noted to be as large as 4.4 cm however measured 4.1 cm on echo, continue to monitor  6.  Cough/post-nasal drip - has tessalon ordered - Will provide Robitussin DM PRN for cough/drainage  7.  "Fevers" - he is concerned about "fever" - was 100.84F yesterday but now resolved - suspect this is inflammation d/t PE resolution  - No leukocytosis  Time  Spent Directly with Patient:  I have spent a total of 35 minutes with patient reviewing 2D echo , telemetry, EKGs, labs and examining patient as well as establishing an assessment and plan that was discussed with the patient.  > 50% of time was spent in direct patient care.     Length of Stay:  LOS: 4 days   , MD [facility] 05/29/2021, 9:31 AM

## 2021-05-29 NOTE — Progress Notes (Addendum)
Family Medicine Teaching Service Daily Progress Note Intern Pager: [phone]  Patient name: Ryan Glass Medical record number: [id number] Date of birth: Dec 03, 1956 Age: 65 y.o. Gender: male  Primary Care Provider: [name], MD Consultants: Cards Code Status: Full  Pt Overview and Major Events to Date:  6/16: admitted   Assessment and Plan: Ryan Glass is a 65 year old male who presented with chest pain and dyspnea and was found to have acute PE. PMH significant for PVCs s/p ablation (2019), dilated aortic root, HTN, HLD, OSA, rheumatoid arthritis, and gout.   Chest Pain  PE  PVC's  HFmrEF Remained Afebrile.  PVC's decreasing.  Still endorses some SOB.  Plan for Cath today   - Cardiology owing appreciate recs - Heparin GTT - Decreased to Oral Lasix 40 mg BID - Consider Farsiga  Fever Afbrile over last 24 hours.  No pneumonia on chest X-Ray.  UA negative.  Blood culture pending.  Thought likely due to PE. - follow up BCx   Hypokalemia/Hypomagnesemia Potassium 4.0.  Mag-1.9.  On Oral Lasix 40 mg BID - Continue oral mag and  - AM BMP, Mg  AKI Improved from yesterday.  1.57>1.28. - AM BMP  FEN/GI: NPO for procedure PPx: Heparin gtt   Status is: Inpatient  Remains inpatient appropriate because:Ongoing diagnostic testing needed not appropriate for outpatient work up  Dispo: The patient is from: Home              Anticipated d/c is to: Home              Patient currently is not medically stable to d/c.   Difficult to place patient No    Subjective:  Patient indicates cough and fever have improved but still feeling short of breath.  Objective: Temp:  [98.5 F (36.9 C)-99.4 F (37.4 C)] 98.5 F (36.9 C) (06/20 0434) Pulse Rate:  [65-94] 65 (06/20 0434) Resp:  [12-20] 14 (06/20 0434) BP: (122-146)/(67-95) 137/95 (06/20 0434) SpO2:  [93 %-97 %] 95 % (06/20 0434) FiO2 (%):  [21 %] 21 % (06/19 0848) Weight:  [449.6 kg] 118.2 kg (06/20 0434) Physical  Exam:  Physical Exam HENT:     Head: Normocephalic and atraumatic.     Mouth/Throat:     Mouth: Mucous membranes are moist.  Cardiovascular:     Rate and Rhythm: Normal rate and regular rhythm.     Pulses: Normal pulses.  Pulmonary:     Effort: Pulmonary effort is normal.     Breath sounds: Normal breath sounds.  Skin:    General: Skin is warm.  Neurological:     General: No focal deficit present.     Mental Status: He is alert.     Laboratory: Recent Labs  Lab 05/27/21 0138 05/28/21 0143 05/29/21 0257  WBC 5.1 4.9 5.2  HGB 12.4* 12.2* 13.0  HCT 36.5* 35.9* 38.6*  PLT 187 207 194   Recent Labs  Lab 05/27/21 0138 05/27/21 1947 05/28/21 0143 05/28/21 1621 05/29/21 0257  NA 134*   < > 134* 131* 136  K 3.0*   < > 3.6 4.2 4.0  CL 103   < > 102 102 105  CO2 22   < > 23 21* 21*  BUN 20   < > 15 20 21   CREATININE 1.41*   < > 1.21 1.57* 1.28*  CALCIUM 8.7*   < > 8.8* 8.7* 9.3  PROT 5.7*  --   --   --   --  BILITOT 1.0  --   --   --   --   ALKPHOS 63  --   --   --   --   ALT 24  --   --   --   --   AST 20  --   --   --   --   GLUCOSE 102*   < > 96 135* 99   < > = values in this interval not displayed.   Magnesium- 1.9  Imaging/Diagnostic Tests: EXAM: CHEST - 2 VIEW   COMPARISON:  05/24/2021   FINDINGS: Cardiac shadow is enlarged. Mild central vascular congestion is noted. No focal infiltrate or effusion is seen. No bony abnormality is noted.   IMPRESSION: Changes of mild CHF.  Jovita Kussmaul, MD 05/29/2021, 7:15 AM PGY-1, [facility] Family Medicine FPTS Intern pager: [phone], text pages welcome

## 2021-05-29 NOTE — Telephone Encounter (Signed)
Cardiac Cath complete and Dr Mayford Knife recommending pulmonology consult as patient had mild CAD/stenosis and shortness of breath inconsistent with size of PE on CT scan.

## 2021-05-30 ENCOUNTER — Encounter (HOSPITAL_COMMUNITY): Payer: Self-pay | Admitting: Cardiovascular Disease

## 2021-05-30 DIAGNOSIS — J3089 Other allergic rhinitis: Secondary | ICD-10-CM

## 2021-05-30 DIAGNOSIS — I110 Hypertensive heart disease with heart failure: Secondary | ICD-10-CM

## 2021-05-30 DIAGNOSIS — E1159 Type 2 diabetes mellitus with other circulatory complications: Secondary | ICD-10-CM | POA: Diagnosis not present

## 2021-05-30 DIAGNOSIS — R0602 Shortness of breath: Secondary | ICD-10-CM

## 2021-05-30 DIAGNOSIS — I2699 Other pulmonary embolism without acute cor pulmonale: Secondary | ICD-10-CM | POA: Diagnosis not present

## 2021-05-30 DIAGNOSIS — G4733 Obstructive sleep apnea (adult) (pediatric): Secondary | ICD-10-CM | POA: Diagnosis not present

## 2021-05-30 DIAGNOSIS — I2510 Atherosclerotic heart disease of native coronary artery without angina pectoris: Secondary | ICD-10-CM | POA: Diagnosis present

## 2021-05-30 DIAGNOSIS — I152 Hypertension secondary to endocrine disorders: Secondary | ICD-10-CM

## 2021-05-30 LAB — CBC
HCT: 36.7 % — ABNORMAL LOW (ref 39.0–52.0)
Hemoglobin: 12.6 g/dL — ABNORMAL LOW (ref 13.0–17.0)
MCH: 33.5 pg (ref 26.0–34.0)
MCHC: 34.3 g/dL (ref 30.0–36.0)
MCV: 97.6 fL (ref 80.0–100.0)
Platelets: 209 10*3/uL (ref 150–400)
RBC: 3.76 MIL/uL — ABNORMAL LOW (ref 4.22–5.81)
RDW: 15.3 % (ref 11.5–15.5)
WBC: 6 10*3/uL (ref 4.0–10.5)
nRBC: 0 % (ref 0.0–0.2)

## 2021-05-30 LAB — BASIC METABOLIC PANEL
Anion gap: 8 (ref 5–15)
BUN: 23 mg/dL (ref 8–23)
CO2: 21 mmol/L — ABNORMAL LOW (ref 22–32)
Calcium: 8.7 mg/dL — ABNORMAL LOW (ref 8.9–10.3)
Chloride: 106 mmol/L (ref 98–111)
Creatinine, Ser: 1.36 mg/dL — ABNORMAL HIGH (ref 0.61–1.24)
GFR, Estimated: 58 mL/min — ABNORMAL LOW (ref >60)
Glucose, Bld: 80 mg/dL (ref 70–99)
Potassium: 4.2 mmol/L (ref 3.5–5.1)
Sodium: 135 mmol/L (ref 135–145)

## 2021-05-30 LAB — URINE CULTURE: Culture: <10000 — AB

## 2021-05-30 LAB — HEPARIN LEVEL (UNFRACTIONATED)
Heparin Unfractionated: <0.1 IU/mL — ABNORMAL LOW (ref 0.30–0.70)
Heparin Unfractionated: 0.52 IU/mL (ref 0.30–0.70)

## 2021-05-30 MED ORDER — CHLORTHALIDONE 25 MG PO TABS
25.0000 mg | ORAL_TABLET | Freq: Every day | ORAL | Status: DC
  Administered 2021-05-30: 25 mg via ORAL
  Filled 2021-05-30 (×2): qty 1

## 2021-05-30 MED ORDER — APIXABAN 5 MG PO TABS
10.0000 mg | ORAL_TABLET | Freq: Two times a day (BID) | ORAL | Status: DC
  Administered 2021-05-30: 10 mg via ORAL
  Filled 2021-05-30: qty 2

## 2021-05-30 MED ORDER — APIXABAN 5 MG PO TABS: 5.0000 mg | ORAL_TABLET | Freq: Two times a day (BID) | ORAL | Status: DC

## 2021-05-30 MED ORDER — SPIRONOLACTONE 25 MG PO TABS: 12.5000 mg | ORAL_TABLET | Freq: Every day | ORAL | 0 refills | Status: DC

## 2021-05-30 MED ORDER — APIXABAN 5 MG PO TABS: 10.0000 mg | ORAL_TABLET | Freq: Two times a day (BID) | ORAL | 0 refills | Status: DC

## 2021-05-30 MED ORDER — POTASSIUM CHLORIDE ER 20 MEQ PO TBCR: EXTENDED_RELEASE_TABLET | ORAL | Status: DC

## 2021-05-30 MED ORDER — APIXABAN 5 MG PO TABS: 5.0000 mg | ORAL_TABLET | Freq: Two times a day (BID) | ORAL | 0 refills | Status: DC

## 2021-05-30 MED ORDER — POTASSIUM CHLORIDE CRYS ER 20 MEQ PO TBCR: 20.0000 meq | EXTENDED_RELEASE_TABLET | Freq: Two times a day (BID) | ORAL | Status: DC

## 2021-05-30 NOTE — Progress Notes (Addendum)
ANTICOAGULATION CONSULT NOTE  Pharmacy Consult for Heparin Indication: pulmonary embolus 05/25/21   Allergies  Allergen Reactions   Amlodipine Besylate Swelling    Leg swelling with 10mg  daily am dosing; decreased with BID 5mg  dosing   Aspirin Itching    Patient Measurements: Height: 5\' 10"  (177.8 cm) Weight: 118.2 kg (260 lb 9.3 oz) IBW/kg (Calculated) : 73 Heparin Dosing Weight: 98.9kg  Vital Signs: Temp: 98.4 F (36.9 C) (06/21 0803) Temp Source: Oral (06/21 0803) BP: 130/95 (06/21 0803) Pulse Rate: 67 (06/21 0803)  Labs: Recent Labs    05/28/21 0143 05/28/21 1435 05/28/21 1621 05/29/21 0257 05/29/21 1642 05/29/21 1646 05/30/21 0103 05/30/21 0822  HGB 12.2*  --   --  13.0 9.9* 11.9* 12.6*  --   HCT 35.9*  --   --  38.6* 29.0* 35.0* 36.7*  --   PLT 207  --   --  194  --   --  209  --   HEPARINUNFRC 0.76*   < >  --  0.73*  --   --  <0.10* 0.52  CREATININE 1.21  --  1.57* 1.28*  --   --  1.36*  --    < > = values in this interval not displayed.     Estimated Creatinine Clearance: 69.8 mL/min (A) (by C-G formula based on SCr of 1.36 mg/dL (H)).  Assessment: 65 y.o. male with small acute PE 05/25/21. No anticoagulation prior to admission. Pharmacy consulted for heparin.  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 OK to restart heparin 6 hours post sheath removal.   CBC: Hgb 12.6, Plt 209  Heparin level: 0.52 (therapeutic)  Goal of Therapy:  Heparin level 0.3-0.7 units/ml Monitor platelets by anticoagulation protocol: Yes   Plan:  Continue heparin infusion at 2250 units/hr Monitor daily HL, CBC/plt Monitor for signs/symptoms of bleeding  F/u oral anticoagulation plan   76, PharmD PGY1 Pharmacy Resident 05/30/2021 9:45 AM   ---------------------- Pharmacy to transition from heparin gtt to apixaban (Eliquis) for long term anticoagulation after PE    Plan:  Discontinue heparin gtt Give first dose of Eliquis when heparin infusion is  stopped Start Eliquis 10mg  BID x7 days (until 6/28) then 5mg  BID thereafter  F/u s/sx bleeding and CBC

## 2021-05-30 NOTE — Progress Notes (Signed)
Mobility Specialist - Progress Note   05/30/21 1044  Mobility  Activity Ambulated in hall  Level of Assistance Standby assist, set-up cues, supervision of patient - no hands on  Assistive Device Other (Comment) (IV stand)  Distance Ambulated (ft) 450 ft  Mobility Ambulated with assistance in hallway  Mobility Response Tolerated well  Mobility performed by Mobility specialist  $Mobility charge 1 Mobility   Pre-mobility, RA: 76 HR, 96% SpO2 Post-mobility, 2L O2: 79 HR, 92% SpO2  Pt's SpO2 was 96% on 2L O2 while ambulating, and ranged from 91% while on RA for the second half of his walk. 2/4 DOE throughout. Pt placed on 2L O2 after walk for comfort. He was left sitting up on edge of bed after walk, call bell at side.   Mamie Levers Mobility Specialist Mobility Specialist Phone: [phone]

## 2021-05-30 NOTE — Progress Notes (Signed)
Physical Therapy Treatment Patient Details Name: KYLLIAN CLINGERMAN MRN: [id number] DOB: [birth year] Today's Date: 05/30/2021    History of Present Illness Pt is a 65 y/o male presenting with chest pain and dyspnea. CT angio shows small peripheral pulmonary embolus in  a lateral segment right lower lobe pulmonary artery branch. PMH includes: PVCs s/p PVC ablation (2019), dilated aortic root, HTN, HLD, lower extremity edema,COVID 19 infection (with long COVID effects per pulmonolgy note), colon polyps s/p polypectomy (2018), OSA.    PT Comments    Pt making good progress. He is mobilizing in room independently.  Able to ambulate 400' and performed a flight of steps with supervision.  Pt did get very short of breath with increased RR on stairs but sats were 91%  on RA.  Pt is concerned about getting his breathing back to normal - he has f/u appt with pulmonologist.  Educated on gradual increase in endurance, monitoring oxygen, and taking rest breaks.     Follow Up Recommendations  Outpatient PT (vs cardiopulmonary rehab)     Equipment Recommendations  None recommended by PT    Recommendations for Other Services       Precautions / Restrictions Precautions Precautions: None    Mobility  Bed Mobility Overal bed mobility: Modified Independent             General bed mobility comments: HOB elevated    Transfers Overall transfer level: Independent Equipment used: None Transfers: Sit to/from Stand Sit to Stand: Independent            Ambulation/Gait Ambulation/Gait assistance: Independent Gait Distance (Feet): 400 Feet Assistive device: None Gait Pattern/deviations: Step-through pattern     General Gait Details: Had supervision for hallway ambulation but has been mobilizing in room independently.  Pt with DOE of 1/4 with ambulation and sats 94% on RA.   Stairs Stairs: Yes Stairs assistance: Min guard Stair Management: One rail Right;Alternating pattern;Step to  pattern Number of Stairs: 20 General stair comments: Pt performed 1 flight of stairs with alternating pattern up and cued for step to pattern down for safety.  He did take a standing rest break on the landing half way.  Sats were 91-92% on stairs but DOE of 3/4 and increased respiratory rate. Pt reports very short of breath.   Wheelchair Mobility    Modified Rankin (Stroke Patients Only)       Balance Overall balance assessment: Needs assistance Sitting-balance support: No upper extremity supported Sitting balance-Leahy Scale: Normal     Standing balance support: No upper extremity supported Standing balance-Leahy Scale: Good                              Cognition Arousal/Alertness: Awake/alert Behavior During Therapy: WFL for tasks assessed/performed Overall Cognitive Status: Within Functional Limits for tasks assessed                                        Exercises      General Comments General comments (skin integrity, edema, etc.): Pt on RA with sats 94% rest and with walking and 91% on stairs.  He does get very short of breath with stairs and required rest break.  Pt reports this is not his normal (does seem that he has had some SOB since having COVID in 2021 but not as severe).  Pt and wife  were educated on compensation technique including rest breaks.  Also, recommended pulse oximeter to monitor and make sure sats >90%.  Pt concerned about his SOB and wanting to know if it will ever improve, educated that his organs are getting necessary oxygen if sats >90% and that He has f/u scheduled with pulmonolgy outpt to further assess.      Pertinent Vitals/Pain Pain Assessment: No/denies pain    Home Living                      Prior Function            PT Goals (current goals can now be found in the care plan section) Acute Rehab PT Goals Patient Stated Goal: get better and go home PT Goal Formulation: With patient Time For Goal  Achievement: 06/10/21 Potential to Achieve Goals: Good Progress towards PT goals: Progressing toward goals    Frequency    Min 3X/week      PT Plan Current plan remains appropriate    Co-evaluation              AM-PAC PT "6 Clicks" Mobility   Outcome Measure  Help needed turning from your back to your side while in a flat bed without using bedrails?: None Help needed moving from lying on your back to sitting on the side of a flat bed without using bedrails?: None Help needed moving to and from a bed to a chair (including a wheelchair)?: None Help needed standing up from a chair using your arms (e.g., wheelchair or bedside chair)?: None Help needed to walk in hospital room?: None Help needed climbing 3-5 steps with a railing? : A Little 6 Click Score: 23    End of Session   Activity Tolerance: Patient tolerated treatment well Patient left: in bed;with call bell/phone within reach Nurse Communication: Mobility status PT Visit Diagnosis: Other abnormalities of gait and mobility (R26.89);Difficulty in walking, not elsewhere classified (R26.2)     Time: 1657-9038 PT Time Calculation (min) (ACUTE ONLY): 24 min  Charges:  $Gait Training: 8-22 mins $Therapeutic Activity: 8-22 mins                     Anise Salvo, PT Acute Rehab Services Pager [phone] [facility] [phone]    Rayetta Humphrey 05/30/2021, 5:55 PM

## 2021-05-30 NOTE — Progress Notes (Signed)
Patient indicated nervous about discharging before being seen by Pulmonologist.  Spoke with Pulm and indicated they would see patient.  Went to see patient and updated with plan.    Jovita Kussmaul, MD 05/30/2021, 3:39 PM PGY-1, [facility] Family Medicine Service pager [phone]

## 2021-05-30 NOTE — Telephone Encounter (Signed)
Per Epic review patient discharged this evening after pulmonary consult.  NP Pulmonary noted "Would have him follow up in clinic to review CPAP download, consider repeat PFTs and order a  CPET to further delineate nature of his dyspnea. Suspect deconditioning. No hypercapnia on ABG.     I've asked him to keep a puls-ox and make note of his sats at home in case they are dropping. He had desaturation without hypoxemia on his PFTs today.   Continue flonase, anti-histamine, montelukast for his rhinitis     He is ok to go back to work Monday 6/27 "  Will follow up with patient via telephone tomorrow.  HR notified

## 2021-05-30 NOTE — Progress Notes (Signed)
D/C tele and Ivs. Went over AVS with pt and all questions were addressed.   Lawson Radar, RN

## 2021-05-30 NOTE — Consult Note (Signed)
NAME:  Ryan Glass, MRN:  [id number], DOB:  11-Sep-1956, LOS: 5 ADMISSION DATE:  05/25/2021, CONSULTATION DATE:  6/21 REFERRING MD:  FPTS, CHIEF COMPLAINT:  dyspnea   History of Present Illness:  65yo male with hx HTN, OSA, RA on Humira admitted 6/16 with dyspnea and chest pain and was found to have acute PE.  Hx of COVID in 07/2020 with concern for long COVID symptoms.  He was seen in December 2021 for evaluation of long COVID with documented exertional shortness of breath since at least August 2021. PFT's at that time showed mild diffusion defect otherwise normal. He has a CPAP at home, new machine within the last 1-2 years, but also has a second machine he wears when he sleeps in his recliner. Never smoker.  Works in [employer] as a Merchandiser, retail.   He was treated with IV heparin and underwent LHC which showed no sig blockage, he was transitioned to DOAC.  After cath it was felt his dyspnea may be out of proportion to size of PE and pulmonary was consulted for evaluation.      He is from [place] and recently traveled there for one month where he underwent dental work.  He did not receive general anesthesia / local only. Reports sinus drainage with post nasal drip & seasonal allergies. Has noted non-productive cough.    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 Mayford Knife), Pneumonia due to COVID-19 virus, PVC's (premature ventricular contractions) (cardiologist--- dr t. Mayford Knife), and Rheumatoid arthritis involving multiple sites [facility]).   Significant Hospital Events: Including procedures, antibiotic start and stop dates in addition to other pertinent events   Cath 05/29/21 >> Prox RCA lesion is 20% stenosed. Prox Cx to Mid  Cx lesion is 20% stenosed. Mid LAD lesion is 30% stenosed.  Mild non-obstructive CAD.  Normal right and left heart pressures. Recommendations: Medical management of mild CAD. Will resume IV heparin 6 hours post sheath pull given acute PE. Would transition to Eliquis or Xarelto tomorrow.  Interim History / Subjective:  Afebrile  I/O 1.5L UOP, - in last 24 hours  Reports ongoing shortness of breath at rest and with exertion   Objective   Blood pressure 109/70, pulse 70, temperature 98.6 F (37 C), temperature source Oral, resp. rate (!) 23, height  (1.778 m), weight 118.2 kg, SpO2 94 %.        Intake/Output Summary (Last 24 hours) at 05/30/2021 1558 Last data filed at 05/30/2021 1525 Gross per 24 hour  Intake 982.6 ml  Output 1500 ml  Net -517.4 ml   Filed Weights   05/28/21 0347 05/29/21 0434  Weight: 116.8 kg 118.2 kg    Examination: General: adult male lying in bed in NAD, wife at bedside  HENT: nasal quality of voice, septum does not appear deviated on exam, pupils equal/reactive  Lungs: non-labored at rest, able to speak full sentences without dyspnea during exam, posterior base crackles on right Cardiovascular: s1s2 RRR, no m/r/g Abdomen: soft, bsx4 active  Extremities: warm/dry, no edema, normal muscle tone, changes of RA noted in hands Neuro: AAOx4, speech clear, MAE, normal strength  Labs/imaging that I havepersonally reviewed  (right click and "Reselect all SmartList Selections" daily)  CTA chest 6/15>> small peripheral pulmonary embolus in  a lateral segment right lower lobe pulmonary artery branch.  ABG 7.43/33/126/22  Scr 1.36, hgb 12.6  Resolved Hospital Problem list     Assessment & Plan:   Dyspnea  Post Nasal Drip / Allergies  Segmental PE (R) Previous hx since COVID in 07/2020 with pulmonary evaluation 11/2020. Thought to be r/t deconditioning post COVID and obesity with underlying sleep apnea.  No parenchymal issues seen on CT at that time. Now  c/b small segmental PE - doubt this is contributing to dyspnea burden given size.  Suspect component of deconditioning given recent one month off work for vacation, dental procedure, obesity and pain from RA limiting activity.    PLAN -  D/c home with DOAC for PE PCP and cardiology f/u at discharge  Outpt pulm f/u arranged, consider repeat PFT Continue OTC allergy medication, flonase, singulair Encourage minimum 30 minutes per day of walking Consider outpatient cardiopulmonary rehab  OK to return to work on 6/22  No desaturations noted with exertion  Medication review shows he is on prednisone 10mg  at baseline, consider weaning off if able. Defer to primary.    Labs   CBC: Recent Labs  Lab 05/25/21 1020 05/26/21 0312 05/27/21 0138 05/28/21 0143 05/29/21 0257 05/29/21 1642 05/29/21 1646 05/30/21 0103  WBC 5.5 5.2 5.1 4.9 5.2  --   --  6.0  NEUTROABS 3.7  --   --   --   --   --   --   --   HGB 12.3* 13.2 12.4* 12.2* 13.0 9.9* 11.9* 12.6*  HCT 36.4* 37.4* 36.5* 35.9* 38.6* 29.0* 35.0* 36.7*  MCV 99.2 96.6 98.4 97.6 98.7  --   --  97.6  PLT 193 178 187 207 194  --   --  209    Basic Metabolic Panel: Recent Labs  Lab 05/26/21 0312 05/26/21 1924 05/27/21 0138 05/27/21 1947 05/28/21 0143 05/28/21 1621 05/29/21 0257 05/29/21 1642 05/29/21 1646 05/30/21 0103  NA 135   < > 134* 133* 134* 131* 136 147* 138 135  K 3.1*   < > 3.0* 4.0 3.6 4.2 4.0 3.2* 4.3 4.2  CL 100   < > 103 103 102 102 105  --   --  106  CO2 25   < > 22 21* 23 21* 21*  --   --  21*  GLUCOSE 116*   < > 102* 120* 96 135* 99  --   --  80  BUN 14   < > 20 19 15 20 21   --   --  23  CREATININE 1.13   < > 1.41* 1.35* 1.21 1.57* 1.28*  --   --  1.36*  CALCIUM 9.1   < > 8.7* 8.7* 8.8* 8.7* 9.3  --   --  8.7*  MG 1.9  --  2.1  --  1.9  --  1.9  --   --   --    < > = values in this interval not displayed.   GFR: Estimated Creatinine Clearance: 69.8 mL/min (A) (by C-G formula based on SCr of 1.36 mg/dL  (H)). Recent Labs  Lab 05/27/21 0138 05/28/21 0143 05/29/21 0257 05/30/21 0103  WBC 5.1 4.9 5.2 6.0    Liver Function Tests: Recent Labs  Lab 05/27/21 0138  AST 20  ALT 24  ALKPHOS 63  BILITOT 1.0  PROT 5.7*  ALBUMIN 3.0*   No results for input(s): LIPASE, AMYLASE in the last 168 hours. No results for input(s): AMMONIA in the  last 168 hours.  ABG    Component Value Date/Time   PHART 7.430 05/29/2021 1646   PCO2ART 33.4 05/29/2021 1646   PO2ART 126 (H) 05/29/2021 1646   HCO3 22.2 05/29/2021 1646   TCO2 23 05/29/2021 1646   ACIDBASEDEF 2.0 05/29/2021 1646   O2SAT 99.0 05/29/2021 1646     Coagulation Profile: Recent Labs  Lab 05/25/21 1020  INR 1.1    Cardiac Enzymes: No results for input(s): CKTOTAL, CKMB, CKMBINDEX, TROPONINI in the last 168 hours.  HbA1C: Hgb A1c MFr Bld  Date/Time Value Ref Range Status  02/20/2021 12:15 PM 5.3 4.8 - 5.6 % Final    Comment:             Prediabetes: 5.7 - 6.4          Diabetes: >6.4          Glycemic control for adults with diabetes: <7.0   08/30/2017 09:11 AM 5.2 4.8 - 5.6 % Final    Comment:             Prediabetes: 5.7 - 6.4          Diabetes: >6.4          Glycemic control for adults with diabetes: <7.0     CBG: No results for input(s): GLUCAP in the last 168 hours.  Review of Systems: Positives in [place]  Gen: Denies fever, chills, weight change, fatigue, night sweats HEENT: Denies blurred vision, double vision, hearing loss, tinnitus, sinus congestion, rhinorrhea, sore throat, neck stiffness, dysphagia PULM: Denies shortness of breath, cough, sputum production, hemoptysis, wheezing CV: Denies chest pain, edema, orthopnea, paroxysmal nocturnal dyspnea, palpitations GI: Denies abdominal pain, nausea, vomiting, diarrhea, hematochezia, melena, constipation, change in bowel habits GU: Denies dysuria, hematuria, polyuria, oliguria, urethral discharge Endocrine: Denies hot or cold intolerance, polyuria, polyphagia  or appetite change Derm: Denies rash, dry skin, scaling or peeling skin change Heme: Denies easy bruising, bleeding, bleeding gums Neuro: Denies headache, numbness, weakness, slurred speech, loss of memory or consciousness   Past Medical History:  He,  has a past medical history of Ascending aortic aneurysm (HCC), Bilateral lower extremity edema, Chronic gout without tophus, CKD (chronic kidney disease), stage II, 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 Mayford Knife), Pneumonia due to COVID-19 virus, PVC's (premature ventricular contractions) (cardiologist--- dr t. Mayford Knife), and Rheumatoid arthritis involving multiple sites [facility]).   Surgical History:   Past Surgical History:  Procedure Laterality Date   HYDROCELE EXCISION Left 06/14/2020   Procedure: LEFT  HYDROCELECTOMY ADULT;  Surgeon: Noel Christmas, MD;  Location: [facility];  Service: Urology;  Laterality: Left;   INCISIONAL HERNIA REPAIR  02-23-2016      LAPAROSCOPIC   LAPAROSCOPIC INGUINAL HERNIA REPAIR Bilateral 08-22-2015     AND UMBILICAL HERNIA REPAIR   PVC ABLATION N/A 10/07/2018   Procedure: PVC ABLATION;  Surgeon: Marinus Maw, MD;  Location: [facility] INVASIVE [facility];  Service: Cardiovascular;  Laterality: N/A;   RIGHT/LEFT HEART CATH AND CORONARY ANGIOGRAPHY N/A 05/29/2021   Procedure: RIGHT/LEFT HEART CATH AND CORONARY ANGIOGRAPHY;  Surgeon: Kathleene Hazel, MD;  Location: [facility] INVASIVE CV LAB;  Service: Cardiovascular;  Laterality: N/A;   UMBILICAL HERNIA REPAIR  child     Social History:   reports that he has never smoked. He has never used smokeless tobacco. He reports current alcohol use of about 5.0 - 7.0 standard drinks of alcohol per week.  He reports that he does not use drugs.   Family History:  His family history includes Cardiomyopathy in his mother; Heart attack in his father.    Allergies Allergies  Allergen Reactions   Amlodipine Besylate Swelling    Leg swelling with  daily am dosing; decreased with BID  dosing   Aspirin Itching     Home Medications  Prior to Admission medications   Medication Sig Start Date End Date Taking? Authorizing Provider  acetaminophen-codeine (TYLENOL #3) 300-30 MG tablet Take 1-2 tablets by mouth as needed for moderate pain. 05/06/19  Yes [provider]  albuterol (VENTOLIN HFA) 108 (90 Base) MCG/ACT inhaler Inhale 2 puffs into the lungs every 4 (four) hours as needed for wheezing or shortness of breath. 03/02/21  Yes Betancourt, Tina A, NP  atorvastatin (LIPITOR) 40 MG tablet Take 1 tablet (40 mg total) by mouth daily. 01/09/21  Yes Turner, Cornelious Bryant, MD  chlorthalidone (HYGROTON) 25 MG tablet Take 25 mg by mouth daily. 04/02/21  Yes [provider]  colchicine 0.6 MG tablet Take 1-2 tablets by mouth daily as needed (gout).  01/31/16  Yes [provider]  diclofenac (VOLTAREN) 75 MG EC tablet Take 75 mg by mouth 2 (two) times daily. 08/03/20  Yes [provider]  diltiazem (CARDIZEM CD) 180 MG 24 hr capsule Take 1 capsule (180 mg total) by mouth daily. 02/27/21  Yes Turner, Cornelious Bryant, MD  fluticasone (FLONASE) 50 MCG/ACT nasal spray SPRAY ONE SPRAY IN EACH NOSTRIL TWICE DAILY Patient taking differently: Place 1 spray into both nostrils 2 (two) times daily. 03/22/20  Yes Betancourt, Jarold Song, NP  Fluticasone-Salmeterol (ADVAIR) 250-50 MCG/DOSE AEPB Inhale 1 puff into the lungs every 12 (twelve) hours. 11/28/20  Yes Martina Sinner, MD  folic acid (FOLVITE) 1 MG tablet Take 1 mg by mouth daily. 01/29/20  Yes [provider]  furosemide (LASIX) 40 MG tablet Take 1 tablet (40 mg total) by mouth 2 (two) times daily. 03/15/21 06/13/21 Yes Marinus Maw, MD  gabapentin (NEURONTIN) 600 MG tablet Take 600 mg by mouth 3 (three) times daily.  02/04/19  Yes [provider]  HUMIRA PEN 40 MG/0.4ML  PNKT Inject 40 mg as directed every 14 (fourteen) days. 04/09/21  Yes [provider]  hydrALAZINE (APRESOLINE) 100 MG tablet TAKE ONE TABLET BY MOUTH THREE TIMES A DAY Patient taking differently: Take 100 mg by mouth 3 (three) times daily. 04/03/21  Yes Dunn, Dayna N, PA-C  KLOR-CON M20 20 MEQ tablet TAKE TWO TABLETS BY MOUTH TWICE A DAY Patient taking differently: Take 20 mEq by mouth 2 (two) times daily. 04/03/21  Yes Dunn, Dayna N, PA-C  losartan (COZAAR) 100 MG tablet Take 1 tablet (100 mg total) by mouth daily. Please keep upcoming appt in January 2022 with Dr. Mayford Knife before anymore refills. Thank you 12/16/20  Yes Quintella Reichert, MD  methotrexate 2.5 MG tablet Take 15 mg by mouth every Friday. 06/22/20  Yes [provider]  nebivolol (BYSTOLIC) 5 MG tablet TAKE TWO TABLETS BY MOUTH EVERY NIGHT AT BEDTIME Patient taking differently: Take 5 mg by mouth at bedtime. 03/06/21  Yes Turner, Cornelious Bryant, MD  Potassium Chloride ER 20 MEQ TBCR Take 1 tablet at lunchtime by mouth in addition to 2 tabs am/pm Patient taking differently: Take 20-40 mEq by mouth See admin instructions. Take 1 tablet at lunchtime by mouth in addition to 2 tabs am/pm 04/04/21  Yes Betancourt, Jarold Song, NP  predniSONE (DELTASONE) 5  MG tablet Take 10 mg by mouth daily.  01/29/20  Yes [provider]  sildenafil (REVATIO) 20 MG tablet Take 20 mg by mouth daily as needed (erectile dysfunction). 08/03/20  Yes [provider]  montelukast (SINGULAIR) 10 MG tablet TAKE ONE TABLET BY MOUTH EVERY NIGHT AT BEDTIME Patient not taking: No sig reported 02/05/21   Betancourt, Jarold Song, NP     Canary Brim, MSN, APRN, NP-C, AGACNP-BC [facility] 05/30/2021, 5:29 PM   Please see [email or web address] for pager details.   From 7A-7P if no response, please call [phone] After hours, please call ELink [phone]

## 2021-05-30 NOTE — Progress Notes (Signed)
Family Medicine Teaching Service Daily Progress Note Intern Pager: [phone]  Patient name: Ryan Glass Medical record number: [id number] Date of birth: [birth year] Age: 65 y.o. Gender: male  Primary Care Provider: [name], MD Consultants: Full Code Status: Cards  Pt Overview and Major Events to Date:  6/16: Admitted 6/20: LHC  Assessment and Plan: Ryan Glass is a 65 year old male who presented with chest pain and dyspnea and was found to have acute PE. PMH significant for PVCs s/p ablation (2019), dilated aortic root, HTN, HLD, OSA, rheumatoid arthritis, and gout.   Chest Pain  PE  PVC's  HFmrEF LHC showed no significant blockage.  Cardilogy reccomennd switching patient to DOAC and consult pulm.   - Stop Heparin drip - Switch to DOAC - Follow up with Pulm  AKI Creatinine- 1.36.  Stop Lasix - Restart Clorthalidone  Hypokalemia K-4.3.  Plan to continue Spironolactone.  Switch to 20 mg BID oral K. - Oral K supplementation - AM BMP  FEN/GI: Heart Healthy diet PPx: DOAC   Status is: Inpatient  Remains inpatient appropriate because:Ongoing diagnostic testing needed not appropriate for outpatient work up  Dispo: The patient is from: Home              Anticipated d/c is to: Home              Patient currently is not medically stable to d/c.   Difficult to place patient No   Subjective:  Patient indicates still having difficulty breathing.    Objective: Temp:  [97.6 F (36.4 C)-99.1 F (37.3 C)] 98.4 F (36.9 C) (06/21 0803) Pulse Rate:  [59-100] 67 (06/21 0803) Resp:  [15-40] 19 (06/21 0803) BP: (101-147)/(50-95) 130/95 (06/21 0803) SpO2:  [91 %-98 %] 93 % (06/21 0803)  Physical Exam HENT:     Head: Normocephalic and atraumatic.     Mouth/Throat:     Mouth: Mucous membranes are moist.  Cardiovascular:     Rate and Rhythm: Normal rate and regular rhythm.     Pulses: Normal pulses.  Pulmonary:     Breath sounds: Normal breath sounds.      Comments: Incrreased effort Skin:    General: Skin is warm.  Neurological:     General: No focal deficit present.     Mental Status: He is alert.  Psychiatric:        Mood and Affect: Mood normal.        Behavior: Behavior normal.     Laboratory: Recent Labs  Lab 05/28/21 0143 05/29/21 0257 05/29/21 1642 05/29/21 1646 05/30/21 0103  WBC 4.9 5.2  --   --  6.0  HGB 12.2* 13.0 9.9* 11.9* 12.6*  HCT 35.9* 38.6* 29.0* 35.0* 36.7*  PLT 207 194  --   --  209   Recent Labs  Lab 05/27/21 0138 05/27/21 1947 05/28/21 1621 05/29/21 0257 05/29/21 1642 05/29/21 1646 05/30/21 0103  NA 134*   < > 131* 136 147* 138 135  K 3.0*   < > 4.2 4.0 3.2* 4.3 4.2  CL 103   < > 102 105  --   --  106  CO2 22   < > 21* 21*  --   --  21*  BUN 20   < > 20 21  --   --  23  CREATININE 1.41*   < > 1.57* 1.28*  --   --  1.36*  CALCIUM 8.7*   < > 8.7* 9.3  --   --  8.7*  PROT 5.7*  --   --   --   --   --   --   BILITOT 1.0  --   --   --   --   --   --   ALKPHOS 63  --   --   --   --   --   --   ALT 24  --   --   --   --   --   --   AST 20  --   --   --   --   --   --   GLUCOSE 102*   < > 135* 99  --   --  80   < > = values in this interval not displayed.    Imaging/Diagnostic Tests: No new imaging  Jovita Kussmaul, MD 05/30/2021, 8:17 AM PGY-1, [facility] Family Medicine FPTS Intern pager: [phone], text pages welcome

## 2021-05-30 NOTE — Discharge Instructions (Addendum)
Dear Ryan Glass,   Thank you for letting us participate in your care! In this section, you will find a brief hospital admission summary of why you were admitted to the hospital, what happened during your admission, your diagnosis/diagnoses, and recommended follow up.  You were admitted because you were experiencing chets pain and difficulty breathing.  Your testing revealed Pulmonary embolism (blood clot in lungs) and overall great functioning heart.  You were diagnosed with ***. You were treated with Heparin.  You were also seen by Pulmonology. They recommended following up with them outpatient.  Your oxygen saturations were good and you were discharged from the hospital for meeting this goal.    POST-HOSPITAL & CARE INSTRUCTIONS We stopped your Lasix and started you on a medication called Spironolactone.  With these changes, I recommend decreasing potassium pill to 1 in the morning and 1 at night.   Please let PCP/Specialists know of any changes in medications that were made.  Please see medications section of this packet for any medication changes.   DOCTOR'S APPOINTMENTS & FOLLOW UP Future Appointments  Date Time Provider Department Center  06/15/2021 11:30 AM Parrett, Virgel Bouquet, NP [facility] None  07/11/2021 11:15 AM Dyann Kief, PA-C [facility]  11/30/2021  8:30 AM [facility] 1 [facility]     Thank you for choosing [facility]! Take care and be well!  Family Medicine Teaching Service Inpatient Team [place]  [facility]  [address] [place], [place] [address] [phone]    Information on my medicine - ELIQUIS (apixaban)   Why was Eliquis prescribed for you? Eliquis was prescribed to treat blood clots that may have been found in the veins of your legs (deep vein thrombosis) or in your lungs (pulmonary embolism) and to reduce the risk of them occurring again.  What do You need to know about Eliquis ? The  starting dose is 10 mg (two 5 mg tablets) taken TWICE daily for the FIRST SEVEN (7) DAYS, then on Tuesday June 06, 2021 (06/06/2021) the dose is reduced to ONE 5 mg tablet taken TWICE daily.  Eliquis may be taken with or without food.   Try to take the dose about the same time in the morning and in the evening. If you have difficulty swallowing the tablet whole please discuss with your pharmacist how to take the medication safely.  Take Eliquis exactly as prescribed and DO NOT stop taking Eliquis without talking to the doctor who prescribed the medication.  Stopping may increase your risk of developing a new blood clot.  Refill your prescription before you run out.  After discharge, you should have regular check-up appointments with your healthcare provider that is prescribing your Eliquis.    What do you do if you miss a dose? If a dose of ELIQUIS is not taken at the scheduled time, take it as soon as possible on the same day and twice-daily administration should be resumed. The dose should not be doubled to make up for a missed dose.  Important Safety Information A possible side effect of Eliquis is bleeding. You should call your healthcare provider right away if you experience any of the following: Bleeding from an injury or your nose that does not stop. Unusual colored urine (red or dark brown) or unusual colored stools (red or black). Unusual bruising for unknown reasons. A serious fall or if you hit your head (even if there is no bleeding).  Some medicines may  interact with Eliquis and might increase your risk of bleeding or clotting while on Eliquis. To help avoid this, consult your healthcare provider or pharmacist prior to using any new prescription or non-prescription medications, including herbals, vitamins, non-steroidal anti-inflammatory drugs (NSAIDs) and supplements.  This website has more information on Eliquis (apixaban): http://www.eliquis.com/eliquis/home

## 2021-05-31 ENCOUNTER — Encounter: Payer: Self-pay | Admitting: Registered Nurse

## 2021-05-31 ENCOUNTER — Telehealth: Payer: Self-pay | Admitting: Registered Nurse

## 2021-05-31 DIAGNOSIS — J301 Allergic rhinitis due to pollen: Secondary | ICD-10-CM

## 2021-05-31 MED ORDER — MONTELUKAST SODIUM 10 MG PO TABS: 10.0000 mg | ORAL_TABLET | Freq: Every day | ORAL | 0 refills | Status: DC

## 2021-05-31 NOTE — Telephone Encounter (Signed)
Spoke with patient and spouse via telephone today.  Reported happy to be home but some questions on his medicine specifically hydralazine, potassium and singulair.  He stated lasix was stopped along with singulair in hospital.  Patient was expecting allergy medication and singulair at pharmacy yesterday after pulmonary consult.  Discussed with patient can buy over the counter claritin/zyrtec/allegra generic at [facility] to take once a day.  Noted that Dr Pecola Leisure discontinued singulair prescription and will contact him to find out reasoning as unable to find in chart and pulmonary wanted patient to continue use.  Patient also asking if he should continue hydralazine as on AVS states to ask his provider as he was receiving IV in the hospital and has oral at home.  Discussed I would contact Dr Mayford Knife for him to verify dosing.  Discussed her note in his chart states to continue three times per day.  Reiterated with patient important that he take his lipitor every day as plaques seen in blood vessels during cardiac catheterization.  Patient asking why potassium doses were lowered.  Discussed with patient lasix stopped but if he notices his usual symptoms of low potassium to notify his providers so blood test can be completed and his doses adjusted if low again.  Encouraged patient to rest today as not getting good sleep in hospital/s/p cardiac catheterization, pulmonary embolism and body needs to recover.  Eat regular meals/hydrate.  Monitor his sp02 when new monitor arrives from [facility] today and contact his provider if readings less than 90 consistently at rest.  Patient reported still feeling short of breath.  Discussed I would call him back after I received updates from Drs Mayford Knife and [facility].  Patient A&Ox3 no cough/throat clearing or nasal sniffing noted during call.  Patient speaking in sentences but sometimes needing breath midsentence.  Duration of this call 17 minutes.  Patient and spouse verbalized understanding  information/instructions, agreed with plan of care and had no further questions or concerns at this time.

## 2021-05-31 NOTE — Telephone Encounter (Signed)
Case discussed with Dr Pecola Leisure and patient to continue singulair 10mg  po daily #90 RF0 sent to his pharmacy of choice.  Patient to discuss with pulmonology at 7 Jul appt if he is to continue daily use and for Rx refill.  Dr 9 Jul reiterated patient to take hydralazine 100mg  po TID.  Patient speaking in sentences but sometimes has to take breath in middle of sentence.  He reported still a little short of breath.  New SP02 monitor ordered yesterday from Mayford Knife and should be arriving today so hasn't been able to take assessment since hospital discharge.  Resting today.  Slept better last night and does not plan to return to work until next Monday.  Patient notified I am on vacation starting Friday 6/24.  RN Sunday will be in clinic with PA Ratcliffe Tues and Thur next week.  Patient to bring monitor with him to compare with clinic monitor to see if both reading similarly next week.  Patient notified of above, verbalized understanding information/instructions, agreed with plan of care and had no further questions at this time.  Total telephone time 21 minutes today.

## 2021-05-31 NOTE — Telephone Encounter (Signed)
Patient AVS says to ask provider if he should take hydralazine 100mg  po TID.  Reviewed inpatient notes and Dr wrote for patient to continue after cardiac cath completed.  Patient notified but stated I would contact Dr Mayford Knife to re-verify since he had many changes to his medicine during hospitalization.  He also has questions about singulair.  Pulmonary told him to continue taking along with flonase and antihistamine and that is written in NP note but Dr Mayford Knife discontinued singulair and no Rx waiting for patient at pharmacy for allergy medicine.  I notified patient I would contact Dr Pecola Leisure to find out why singulair discontinued as not clear in inpatient notes.  Patient aware his lasix stopped.  He is concerned his potassium changed from Pecola Leisure BID and at lunch to BID now.  Discussed with patient that due to stopping lasix his team thinking he may not need as much now but he should notify PCM/cardiology if feeling like his potassium low/increased palpitations for level recheck since required IV potassium in hospital.  Patient pulmonary appt 7 July.

## 2021-06-01 ENCOUNTER — Telehealth: Payer: Self-pay | Admitting: Adult Health

## 2021-06-01 ENCOUNTER — Telehealth: Payer: Self-pay | Admitting: Cardiology

## 2021-06-01 NOTE — Telephone Encounter (Signed)
Spoke to patient and relayed below message/recommendations.  Patient will print letter off of mychart.  He will keep scheduled visit for 06/15/2021  Advised patient to go to ED if sx worsen prior to his appt. Nothing further needed at this time.

## 2021-06-01 NOTE — Telephone Encounter (Signed)
JD patient last seen 11/15/2020 for PNA pending OV 06/15/2021.  Recent admission 05/25/2021 for PE. Patient is scheduled to return to work on Monday, however he does not feel that he will be ready to return to work due to breathing issues.  He reports of sob with exertion and occ at rest, prod cough with yellow sputum, dizziness with exertion and occ wheezing.  He is using ventolin and advair BID.  He does no have supplemental oxygen.  Denies fever, chills or sweats since being discharged. Per patient, breathing is baseline since discharge.  He would like a letter to remain out of work until his appt on 06/15/2021.   Tammy, please advise. Thanks

## 2021-06-01 NOTE — Telephone Encounter (Signed)
Patient was recently in the hospital for about 1 week. He states while admitted he was advised to contact his cardiologist to clarify which medications he should be taking. Please advise.

## 2021-06-01 NOTE — Telephone Encounter (Signed)
That is fine if he wants to be out of work until then we can send a work note in He has not been seen in the office since December and did not keep his 65-month follow-up  He was seen by cardiology and admitted with new PE found.  If he is having acute symptoms will need ov for evaluation  Please contact office for sooner follow up if symptoms do not improve or worsen or seek emergency care

## 2021-06-01 NOTE — Telephone Encounter (Signed)
Left a message for pt to call back

## 2021-06-01 NOTE — Telephone Encounter (Signed)
Pt stated that he is having difficulty breathing, coughing a lot, dizziness, wheezing; states that he was in hospital for 6 days for blood clots on his lungs and he said that he doesn't know if he can make it until July 7th bc of his condition. Pls regard; [phone]

## 2021-06-01 NOTE — Telephone Encounter (Signed)
Patient contacted via telephone today.  Feeling worse today than yesterday.  Lowest sp02 88-89 when putting on shoes/socks.  Otherwise running 92-92% on RA.  Patient doesn't feel he can return to work on Monday with this shortness of breath.  He contacted pulmonology to see if his appt could be sooner but they stated it could not but could given him work excuse until follow up appt.  Patient spoke full sentences without difficulty fewer breaths in middle of sentences today.  Patient concerned about walking across warehouse/exertion worsening breathing.  Discussed dust/increased warehouse temperatures and exertion will probably worsening his dyspnea with exertion.  I recommended staying home to recuperate if still needing the time.  Patient verbalized understanding information/instructions, agreed with plan of care and had no further questions at this time.

## 2021-06-02 ENCOUNTER — Encounter: Payer: Self-pay | Admitting: Family Medicine

## 2021-06-02 LAB — CULTURE, BLOOD (SINGLE)
Culture: NO GROWTH
Special Requests: ADEQUATE

## 2021-06-02 NOTE — Hospital Course (Addendum)
Ryan Glass is a 65 y.o. male presenting with . PMH is significant for PVCs s/p PVC ablation (2019), dilated aortic root, HTN, HLD, lower extremity edema,COVID 19 infection, colon polyps s/p polypectomy (2018), OSA.   Dyspnea on Exertion 2/2 Acute pulmonary emboli  Chest Pain Patient sent to ED after finding Pulmonary Emboli on outpatient CTA ordered by Cardiologist.  Received IV Heparin got Right and Left Heart cath which showed no significant vessel stenosis or obstruction.  Remained woth good O2 saturation even while ambulating.  Cards work-up normal but patient remained dyspneic.  Recommended Pulm consult who saw patient and recommended outpatient follow-up for further testing.  Heparin drip stopped and patient discharged with Eliquis therapy.   Hypokalemia Patient arrived with Hypokalemia.  On 100 mEq daily at home.  Continued to replete while patient being diuresed on Lasix.  Also started on Spironolactone and eventually Lasix stopped and changed to home medication of Chlorthalidone.  Potassium nomrlaized by time of discharge.  Given medication changes, patient discharged on 40 mEq daily.    AKI Patient presented with normal kidney function.  Underwent heavy diuresis and developed mild AKI with Creatinine of 1.36 by time of discharge.  Believed would resolve on own given stopping of IV diuresis.   PVCs s/p PVC ablation Initially had frequent PVC's on Telemetry.  This improved throughout hospital stay.  Much less frequent by time of discharge.

## 2021-06-02 NOTE — Telephone Encounter (Signed)
Follow up:     Patient returning a call back from yesterday and patient would like for the doctor to look over all his medications.

## 2021-06-02 NOTE — Telephone Encounter (Signed)
Called patient back and advised him that Dr. Mayford Knife also recommends that he go to the ER. The patient's wife was on the phone as well. She is in agreement with plan and states that she will convince the patient to go.

## 2021-06-02 NOTE — Telephone Encounter (Signed)
Spoke with the patient who reports confusion in regards to his medications. Patient did not have his list with him but specific questions have been answered. Encouraged patient to review medication list from when he was discharged from the hospital.  Patient is very short of breath on the phone. He states that his O2 has been dropping below 90% when he walks. He denies chest pain. He also states that he was walking earlier and he had numbness in his legs. Patient states that he has not been feeling any better since leaving the hospital. I have encouraged the patient to go to the ER for evaluation due to worsening symptoms. Patient is hesitant to go. He states that if he continues to feel worse then he will go.

## 2021-06-02 NOTE — Discharge Summary (Signed)
Family Medicine Teaching [facility] Discharge Summary  Patient name: Ryan Glass Medical record number: [id number] Date of birth: 08/13/1956 Age: 65 y.o. Gender: male Date of Admission: 05/25/2021  Date of Discharge: 05/30/21 Admitting Physician: Leighton Roach McDiarmid, MD  Primary Care Provider: [name], MD Consultants: Cardiology, Pulmonology  Indication for Hospitalization: PE  Discharge Diagnoses/Problem List:  PVCs s/p PVC ablation (2019), dilated aortic root, HTN, HLD, lower extremity edema,COVID 19 infection, colon polyps s/p polypectomy (2018), OSA.   Disposition: Able to be discharged home safely  Discharge Condition: Stable  Discharge Exam:  Temp:  [97.6 F (36.4 C)-99.1 F (37.3 C)] 98.4 F (36.9 C) (06/21 0803) Pulse Rate:  [59-100] 67 (06/21 0803) Resp:  [15-40] 19 (06/21 0803) BP: (101-147)/(50-95) 130/95 (06/21 0803) SpO2:  [91 %-98 %] 93 % (06/21 0803)   Physical Exam HENT:    Head: Normocephalic and atraumatic.    Mouth/Throat:    Mouth: Mucous membranes are moist. Cardiovascular:    Rate and Rhythm: Normal rate and regular rhythm.    Pulses: Normal pulses. Pulmonary:    Breath sounds: Normal breath sounds.    Comments: Incrreased effort Skin:    General: Skin is warm. Neurological:    General: No focal deficit present.    Mental Status: He is alert. Psychiatric:        Mood and Affect: Mood normal.        Behavior: Behavior normal.  Brief Hospital Course:  Ryan Glass is a 65 y.o. male presenting with . PMH is significant for PVCs s/p PVC ablation (2019), dilated aortic root, HTN, HLD, lower extremity edema,COVID 19 infection, colon polyps s/p polypectomy (2018), OSA.   Dyspnea on Exertion 2/2 Acute pulmonary emboli  Chest Pain Patient sent to ED after finding Pulmonary Emboli on outpatient CTA ordered by Cardiologist.  Received IV Heparin got Right and Left Heart cath which showed no significant vessel stenosis or obstruction.   Remained woth good O2 saturation even while ambulating.  Cards work-up normal but patient remained dyspneic.  Recommended Pulm consult who saw patient and recommended outpatient follow-up for further testing.   Hypokalemia Patient arrived with Hypokalemia.  On 100 mEq daily at home.  Continued to replete while patient being diuresed on Lasix.  Also started on Spironolactone and eventually Lasix stopped and changed to home medication of Chlorthalidone.  Potassium nomrlaized by time of discharge.  Given medication changes, patient discharged on 40 mEq daily.    AKI Patient presented with normal kidney function.  Underwent heavy diuresis and developed mild AKI with Creatinine of 1.36 by time of discharge.  Believed would resolve on own given stopping of IV diuresis.   PVCs s/p PVC ablation Initially had frequent PVC's on Telemetry.  This improved throughout hospital stay.  Much less frequent by time of discharge.           Issues for Follow Up:  AKI/Potassium- Follow-up BMP and adjust Potassium supplementation and medication regimen as needed Dyspnea-  Continue to follow-up with Cardiology and Pulmonology outpatient  Significant Procedures: Left & Right Cath  Significant Labs and Imaging:  Recent Labs  Lab 05/28/21 0143 05/29/21 0257 05/29/21 1642 05/29/21 1646 05/30/21 0103  WBC 4.9 5.2  --   --  6.0  HGB 12.2* 13.0 9.9* 11.9* 12.6*  HCT 35.9* 38.6* 29.0* 35.0* 36.7*  PLT 207 194  --   --  209   Recent Labs  Lab 05/27/21 0138 05/27/21 1947 05/28/21 0143 05/28/21 1621 05/29/21 0257  05/29/21 1642 05/29/21 1646 05/30/21 0103  NA 134* 133* 134* 131* 136 147* 138 135  K 3.0* 4.0 3.6 4.2 4.0 3.2* 4.3 4.2  CL 103 103 102 102 105  --   --  106  CO2 22 21* 23 21* 21*  --   --  21*  GLUCOSE 102* 120* 96 135* 99  --   --  80  BUN 20 19 15 20 21   --   --  23  CREATININE 1.41* 1.35* 1.21 1.57* 1.28*  --   --  1.36*  CALCIUM 8.7* 8.7* 8.8* 8.7* 9.3  --   --  8.7*  MG 2.1  --   1.9  --  1.9  --   --   --   ALKPHOS 63  --   --   --   --   --   --   --   AST 20  --   --   --   --   --   --   --   ALT 24  --   --   --   --   --   --   --   ALBUMIN 3.0*  --   --   --   --   --   --   --      Results/Tests Pending at Time of Discharge: None  Discharge Medications:  Allergies as of 05/30/2021       Reactions   Amlodipine Besylate Swelling   Leg swelling with 10mg  daily am dosing; decreased with BID 5mg  dosing   Aspirin Itching        Medication List     STOP taking these medications    furosemide 40 MG tablet Commonly known as: LASIX   montelukast 10 MG tablet Commonly known as: SINGULAIR       TAKE these medications    acetaminophen-codeine 300-30 MG tablet Commonly known as: TYLENOL #3 Take 1-2 tablets by mouth as needed for moderate pain.   albuterol 108 (90 Base) MCG/ACT inhaler Commonly known as: VENTOLIN HFA Inhale 2 puffs into the lungs every 4 (four) hours as needed for wheezing or shortness of breath.   apixaban 5 MG Tabs tablet Commonly known as: ELIQUIS Take 2 tablets (10 mg total) by mouth 2 (two) times daily.   apixaban 5 MG Tabs tablet Commonly known as: ELIQUIS Take 1 tablet (5 mg total) by mouth 2 (two) times daily. Start taking on: June 06, 2021   atorvastatin 40 MG tablet Commonly known as: LIPITOR Take 1 tablet (40 mg total) by mouth daily.   chlorthalidone 25 MG tablet Commonly known as: HYGROTON Take 25 mg by mouth daily.   colchicine 0.6 MG tablet Take 1-2 tablets by mouth daily as needed (gout).   diclofenac 75 MG EC tablet Commonly known as: VOLTAREN Take 75 mg by mouth 2 (two) times daily.   diltiazem 180 MG 24 hr capsule Commonly known as: Cardizem CD Take 1 capsule (180 mg total) by mouth daily.   fluticasone 50 MCG/ACT nasal spray Commonly known as: FLONASE SPRAY ONE SPRAY IN EACH NOSTRIL TWICE DAILY What changed: See the new instructions.   Fluticasone-Salmeterol 250-50 MCG/DOSE  Aepb Commonly known as: ADVAIR Inhale 1 puff into the lungs every 12 (twelve) hours.   folic acid 1 MG tablet Commonly known as: FOLVITE Take 1 mg by mouth daily.   gabapentin 600 MG tablet Commonly known as: NEURONTIN Take 600 mg by mouth 3 (three)  times daily.   Humira Pen 40 MG/0.4ML Pnkt Generic drug: Adalimumab Inject 40 mg as directed every 14 (fourteen) days.   losartan 100 MG tablet Commonly known as: COZAAR Take 1 tablet (100 mg total) by mouth daily. Please keep upcoming appt in January 2022 with Dr. Mayford Knife before anymore refills. Thank you   methotrexate 2.5 MG tablet Take 15 mg by mouth every Friday.   nebivolol 5 MG tablet Commonly known as: BYSTOLIC TAKE TWO TABLETS BY MOUTH EVERY NIGHT AT BEDTIME What changed: See the new instructions.   Potassium Chloride ER 20 MEQ Tbcr Take 1 tablet in the morning and 1 at night What changed: additional instructions   potassium chloride SA 20 MEQ tablet Commonly known as: Klor-Con M20 Take 1 tablet (20 mEq total) by mouth 2 (two) times daily.   predniSONE 5 MG tablet Commonly known as: DELTASONE Take 10 mg by mouth daily.   sildenafil 20 MG tablet Commonly known as: REVATIO Take 20 mg by mouth daily as needed (erectile dysfunction).   spironolactone 25 MG tablet Commonly known as: ALDACTONE Take 0.5 tablets (12.5 mg total) by mouth daily.       ASK your doctor about these medications    hydrALAZINE 100 MG tablet Commonly known as: APRESOLINE TAKE ONE TABLET BY MOUTH THREE TIMES A DAY        Discharge Instructions: Please refer to Patient Instructions section of EMR for full details.  Patient was counseled important signs and symptoms that should prompt return to medical care, changes in medications, dietary instructions, activity restrictions, and follow up appointments.   Follow-Up Appointments:  Follow-up Information     Dyann Kief, PA-C Follow up on 07/11/2021.   Specialty: Cardiology Why:  @8 :15am for hospital follow up with Dr. PA/NP. Please arrive 10 minutes early Contact information: [address] [place] [phone]         [phone], MD. Schedule an appointment as soon as possible for a visit in 3 day(s).   Specialty: Family Medicine Contact information: [address] [place] [address]         [phone], MD .   Specialty: Cardiology Contact information: [phone] [address]. [address] [place] [phone]         [phone], MD .   Specialty: Cardiology Contact information: [phone] [address] [place] [phone]         [phone], NP Follow up on 06/15/2021.   Specialty: Pulmonary Disease Why: Appointment at 11:30 AM.  Please arrive at 11:15 for check in. Contact information: [address] [place] [phone]                 [phone], MD 06/02/2021, 10:50 PM PGY-1, [facility] Family [facility]

## 2021-06-04 NOTE — Telephone Encounter (Signed)
Patient contacted NP today went walking with spouse outside yesterday and today and sp02 dropped into high 70s when walking outside and returned to 90s once inside air conditioning and resting.  He is using his prescribed budesonide inhaler but not albuterol prn.  Discussed with patient if sp02 doesn't return to baseline with rest to use his albuterol 2 puffs po q4-6h prn protracted cough/chest tightness/wheezing.  HR 51. BP 150/88 and  sp02 during patient call 95% RA.  Patient reported leg swelling usual and weight stable not gaining.  A&Ox3 Patient spoke full sentences without difficulty today; no cough/nasal congestion or throat clearing noted during 6 minute telephone call.  He wants to return to work tomorrow but spouse wants him to recuperate at home per pulmonologist.   Discussed with patient I agree with his spouse since sp02 dropping with exertion his body  needs some more time to recuperate from blood clot.  If spo2 not sustaining, new or worsening symptoms he should notify his pulmonologist/cardiologist this week.  Patient aware I am not in the office this week but on vacation M-F returning 5 July.  HR notified patient continues personal medical next appt 7 Jul. Patient verbalized understanding information/instructions, agreed with plan of care and had no further questions at this time.

## 2021-06-06 ENCOUNTER — Inpatient Hospital Stay (HOSPITAL_BASED_OUTPATIENT_CLINIC_OR_DEPARTMENT_OTHER)
Admission: EM | Admit: 2021-06-06 | Discharge: 2021-06-08 | DRG: 189 | Disposition: A | Discharge status: Home / Self Care | Payer: No Typology Code available for payment source | Attending: Family Medicine | Admitting: Family Medicine

## 2021-06-06 ENCOUNTER — Emergency Department (HOSPITAL_BASED_OUTPATIENT_CLINIC_OR_DEPARTMENT_OTHER): Payer: No Typology Code available for payment source

## 2021-06-06 ENCOUNTER — Other Ambulatory Visit: Payer: Self-pay

## 2021-06-06 DIAGNOSIS — Z7952 Long term (current) use of systemic steroids: Secondary | ICD-10-CM

## 2021-06-06 DIAGNOSIS — J9621 Acute and chronic respiratory failure with hypoxia: Secondary | ICD-10-CM | POA: Diagnosis not present

## 2021-06-06 DIAGNOSIS — I712 Thoracic aortic aneurysm, without rupture: Secondary | ICD-10-CM | POA: Diagnosis present

## 2021-06-06 DIAGNOSIS — R0902 Hypoxemia: Secondary | ICD-10-CM

## 2021-06-06 DIAGNOSIS — I2721 Secondary pulmonary arterial hypertension: Secondary | ICD-10-CM | POA: Diagnosis present

## 2021-06-06 DIAGNOSIS — M0609 Rheumatoid arthritis without rheumatoid factor, multiple sites: Secondary | ICD-10-CM | POA: Diagnosis present

## 2021-06-06 DIAGNOSIS — G4733 Obstructive sleep apnea (adult) (pediatric): Secondary | ICD-10-CM | POA: Diagnosis present

## 2021-06-06 DIAGNOSIS — I509 Heart failure, unspecified: Secondary | ICD-10-CM

## 2021-06-06 DIAGNOSIS — N1831 Chronic kidney disease, stage 3a: Secondary | ICD-10-CM | POA: Diagnosis present

## 2021-06-06 DIAGNOSIS — Z7901 Long term (current) use of anticoagulants: Secondary | ICD-10-CM

## 2021-06-06 DIAGNOSIS — R0609 Other forms of dyspnea: Secondary | ICD-10-CM | POA: Diagnosis present

## 2021-06-06 DIAGNOSIS — M069 Rheumatoid arthritis, unspecified: Secondary | ICD-10-CM | POA: Diagnosis present

## 2021-06-06 DIAGNOSIS — Z86711 Personal history of pulmonary embolism: Secondary | ICD-10-CM

## 2021-06-06 DIAGNOSIS — J9620 Acute and chronic respiratory failure, unspecified whether with hypoxia or hypercapnia: Secondary | ICD-10-CM | POA: Diagnosis present

## 2021-06-06 DIAGNOSIS — R0600 Dyspnea, unspecified: Secondary | ICD-10-CM | POA: Diagnosis present

## 2021-06-06 DIAGNOSIS — R0602 Shortness of breath: Secondary | ICD-10-CM

## 2021-06-06 DIAGNOSIS — F419 Anxiety disorder, unspecified: Secondary | ICD-10-CM | POA: Diagnosis present

## 2021-06-06 DIAGNOSIS — I493 Ventricular premature depolarization: Secondary | ICD-10-CM | POA: Diagnosis present

## 2021-06-06 DIAGNOSIS — M1A0720 Idiopathic chronic gout, left ankle and foot, without tophus (tophi): Secondary | ICD-10-CM | POA: Diagnosis present

## 2021-06-06 DIAGNOSIS — Z8616 Personal history of COVID-19: Secondary | ICD-10-CM

## 2021-06-06 DIAGNOSIS — I130 Hypertensive heart and chronic kidney disease with heart failure and stage 1 through stage 4 chronic kidney disease, or unspecified chronic kidney disease: Secondary | ICD-10-CM | POA: Diagnosis present

## 2021-06-06 DIAGNOSIS — I2699 Other pulmonary embolism without acute cor pulmonale: Secondary | ICD-10-CM | POA: Diagnosis present

## 2021-06-06 DIAGNOSIS — R079 Chest pain, unspecified: Secondary | ICD-10-CM

## 2021-06-06 DIAGNOSIS — Z79899 Other long term (current) drug therapy: Secondary | ICD-10-CM

## 2021-06-06 DIAGNOSIS — N179 Acute kidney failure, unspecified: Secondary | ICD-10-CM | POA: Diagnosis present

## 2021-06-06 DIAGNOSIS — I2510 Atherosclerotic heart disease of native coronary artery without angina pectoris: Secondary | ICD-10-CM | POA: Diagnosis present

## 2021-06-06 DIAGNOSIS — Z7951 Long term (current) use of inhaled steroids: Secondary | ICD-10-CM

## 2021-06-06 DIAGNOSIS — Z8249 Family history of ischemic heart disease and other diseases of the circulatory system: Secondary | ICD-10-CM

## 2021-06-06 DIAGNOSIS — I110 Hypertensive heart disease with heart failure: Secondary | ICD-10-CM | POA: Diagnosis present

## 2021-06-06 DIAGNOSIS — E7849 Other hyperlipidemia: Secondary | ICD-10-CM | POA: Diagnosis present

## 2021-06-06 DIAGNOSIS — I5022 Chronic systolic (congestive) heart failure: Secondary | ICD-10-CM

## 2021-06-06 LAB — COMPREHENSIVE METABOLIC PANEL
ALT: 29 U/L (ref 0–44)
AST: 28 U/L (ref 15–41)
Albumin: 3.7 g/dL (ref 3.5–5.0)
Alkaline Phosphatase: 74 U/L (ref 38–126)
Anion gap: 9 (ref 5–15)
BUN: 27 mg/dL — ABNORMAL HIGH (ref 8–23)
CO2: 20 mmol/L — ABNORMAL LOW (ref 22–32)
Calcium: 9.5 mg/dL (ref 8.9–10.3)
Chloride: 108 mmol/L (ref 98–111)
Creatinine, Ser: 1.65 mg/dL — ABNORMAL HIGH (ref 0.61–1.24)
GFR, Estimated: 46 mL/min — ABNORMAL LOW (ref >60)
Glucose, Bld: 129 mg/dL — ABNORMAL HIGH (ref 70–99)
Potassium: 4.3 mmol/L (ref 3.5–5.1)
Sodium: 137 mmol/L (ref 135–145)
Total Bilirubin: 0.6 mg/dL (ref 0.3–1.2)
Total Protein: 6.5 g/dL (ref 6.5–8.1)

## 2021-06-06 LAB — CBC WITH DIFFERENTIAL/PLATELET
Abs Immature Granulocytes: 0.06 10*3/uL (ref 0.00–0.07)
Basophils Absolute: 0 10*3/uL (ref 0.0–0.1)
Basophils Relative: 0 %
Eosinophils Absolute: 0 10*3/uL (ref 0.0–0.5)
Eosinophils Relative: 0 %
HCT: 37.1 % — ABNORMAL LOW (ref 39.0–52.0)
Hemoglobin: 12.5 g/dL — ABNORMAL LOW (ref 13.0–17.0)
Immature Granulocytes: 1 %
Lymphocytes Relative: 18 %
Lymphs Abs: 1.2 10*3/uL (ref 0.7–4.0)
MCH: 33.2 pg (ref 26.0–34.0)
MCHC: 33.7 g/dL (ref 30.0–36.0)
MCV: 98.4 fL (ref 80.0–100.0)
Monocytes Absolute: 0.5 10*3/uL (ref 0.1–1.0)
Monocytes Relative: 8 %
Neutro Abs: 5 10*3/uL (ref 1.7–7.7)
Neutrophils Relative %: 73 %
Platelets: 270 10*3/uL (ref 150–400)
RBC: 3.77 MIL/uL — ABNORMAL LOW (ref 4.22–5.81)
RDW: 15 % (ref 11.5–15.5)
WBC: 6.8 10*3/uL (ref 4.0–10.5)
nRBC: 0 % (ref 0.0–0.2)

## 2021-06-06 LAB — RESP PANEL BY RT-PCR (FLU A&B, COVID) ARPGX2
Influenza A by PCR: NEGATIVE
Influenza B by PCR: NEGATIVE
SARS Coronavirus 2 by RT PCR: NEGATIVE

## 2021-06-06 LAB — TROPONIN I (HIGH SENSITIVITY)
Troponin I (High Sensitivity): 14 ng/L (ref <18)
Troponin I (High Sensitivity): 13 ng/L (ref <18)

## 2021-06-06 LAB — LACTIC ACID, PLASMA
Lactic Acid, Venous: 2.2 mmol/L (ref 0.5–1.9)
Lactic Acid, Venous: 1.3 mmol/L (ref 0.5–1.9)

## 2021-06-06 LAB — BRAIN NATRIURETIC PEPTIDE: B Natriuretic Peptide: 74.2 pg/mL (ref 0.0–100.0)

## 2021-06-06 LAB — MAGNESIUM: Magnesium: 1.9 mg/dL (ref 1.7–2.4)

## 2021-06-06 LAB — TSH: TSH: 0.975 u[IU]/mL (ref 0.350–4.500)

## 2021-06-06 LAB — LIPASE, BLOOD: Lipase: 41 U/L (ref 11–51)

## 2021-06-06 IMAGING — CT CT ANGIO CHEST
2 of 10 series · 18 of 36 positions shown · IV contrast (omnipaque)
Comparison: [DATE].

CLINICAL DATA: Hypoxia.

EXAM:
CT ANGIOGRAPHY CHEST WITH CONTRAST
TECHNIQUE: Multidetector CT imaging of the chest was performed using the
standard protocol during bolus administration of intravenous
contrast. Multiplanar CT image reconstructions and MIPs were
obtained to evaluate the vascular anatomy.
CONTRAST:  100mL OMNIPAQUE IOHEXOL 350 MG/ML SOLN

[Series 8: pe thins · axial · 0.86mm/px · z∈[+1092,+1396]mm · 17 of 342 slices shown]
[im 19/342  lung]
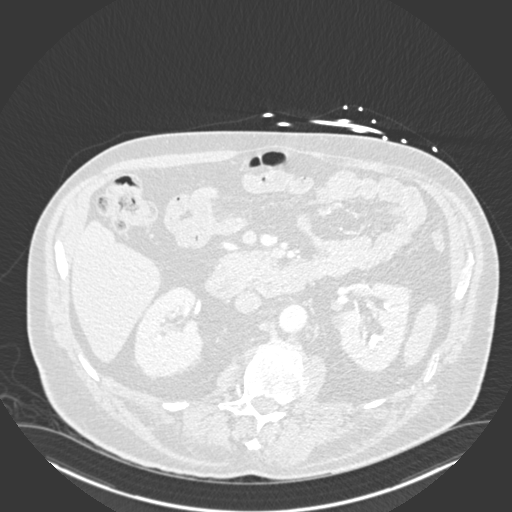
[im 38/342  mediastinal]
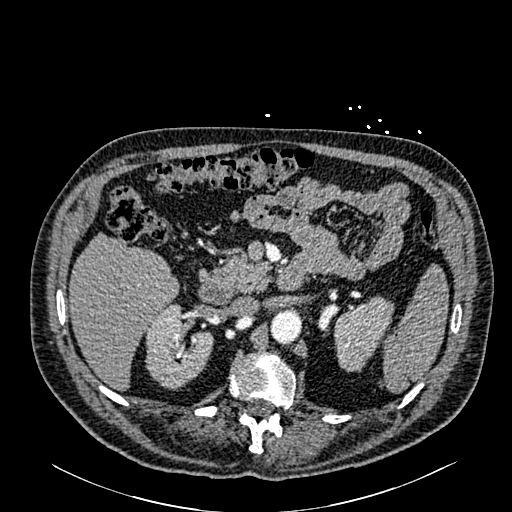
[im 57/342  lung]
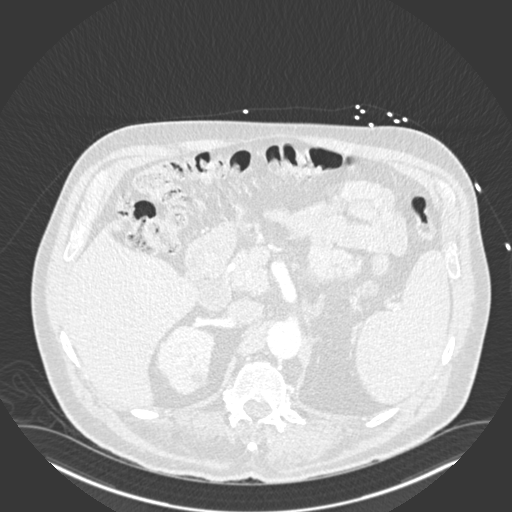
[im 76/342  mediastinal]
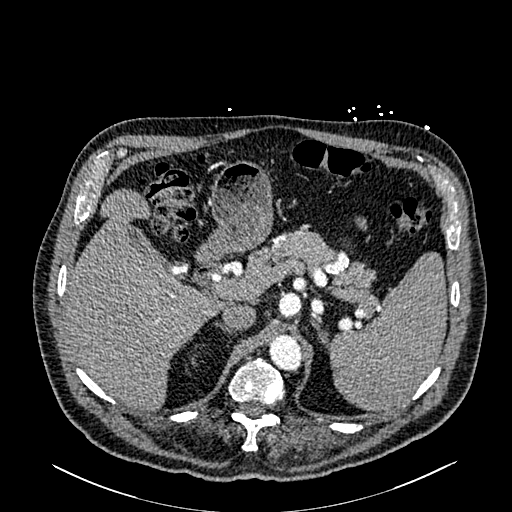
[im 95/342  lung]
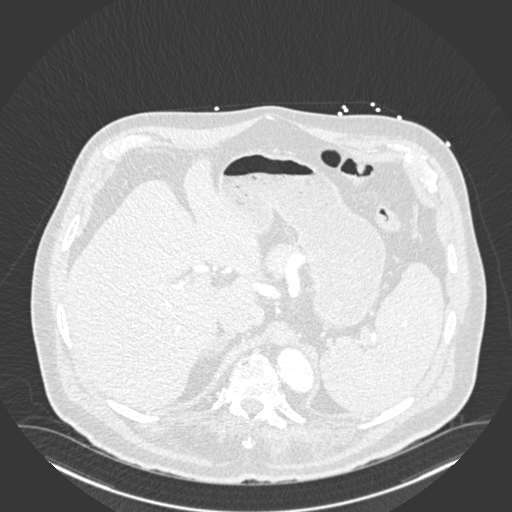
[im 114/342  mediastinal]
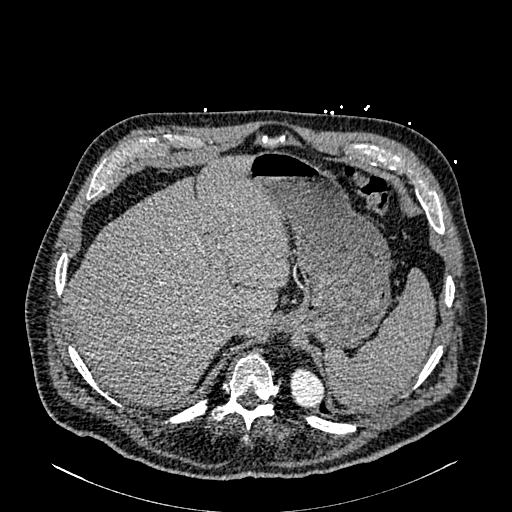
[im 133/342  lung]
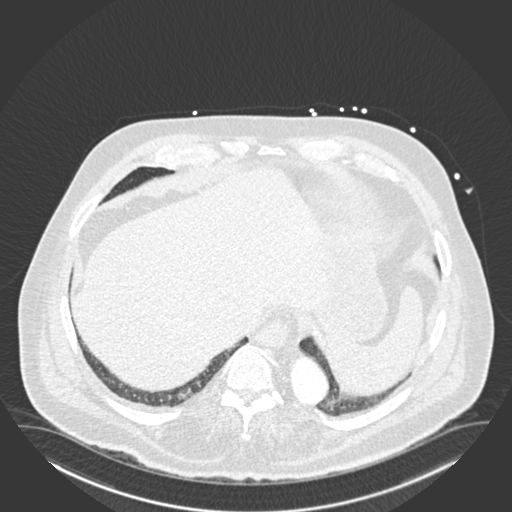
[im 152/342  mediastinal]
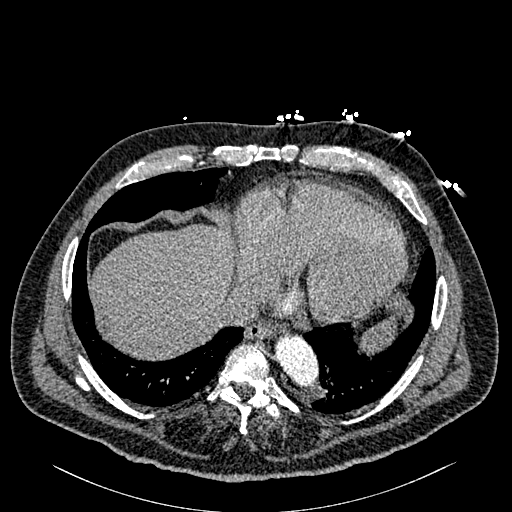
[im 171/342  lung]
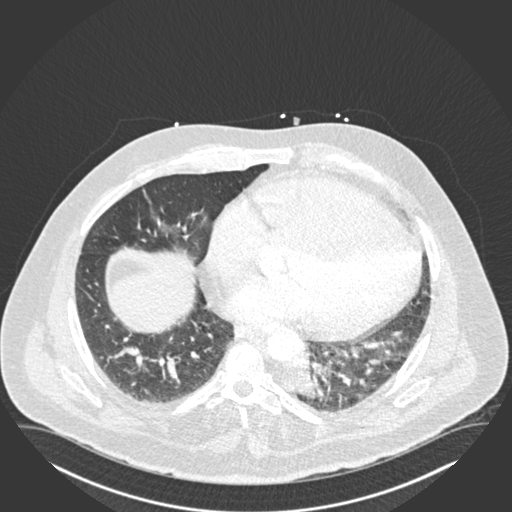
[im 190/342  mediastinal]
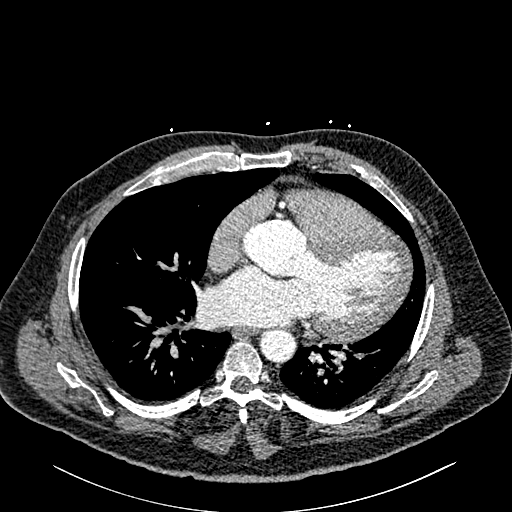
[im 209/342  lung]
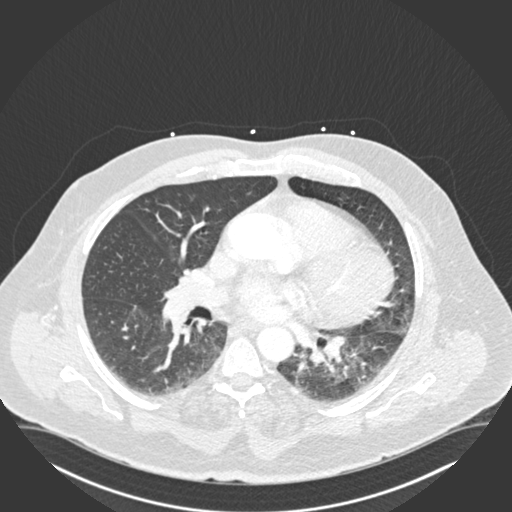
[im 228/342  mediastinal]
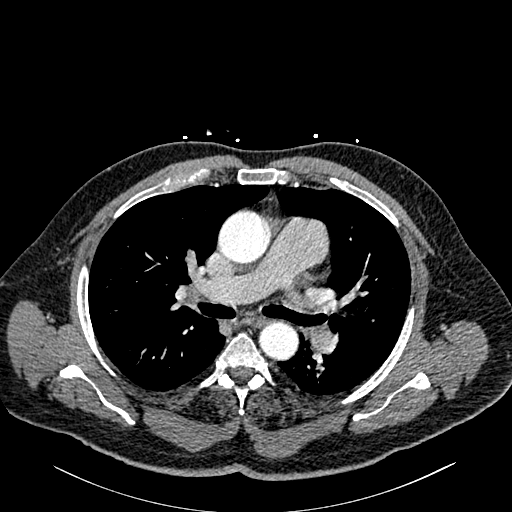
[im 247/342  lung]
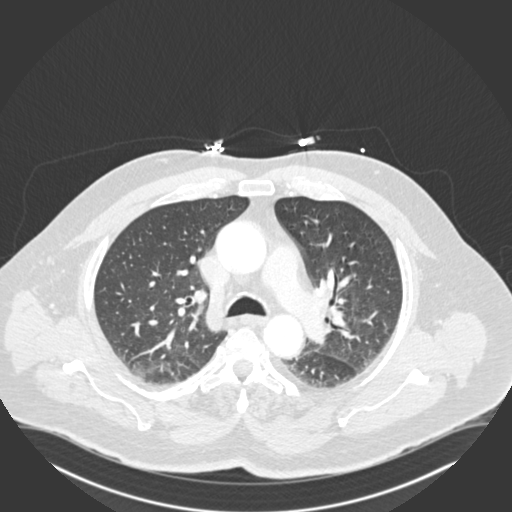
[im 266/342  mediastinal]
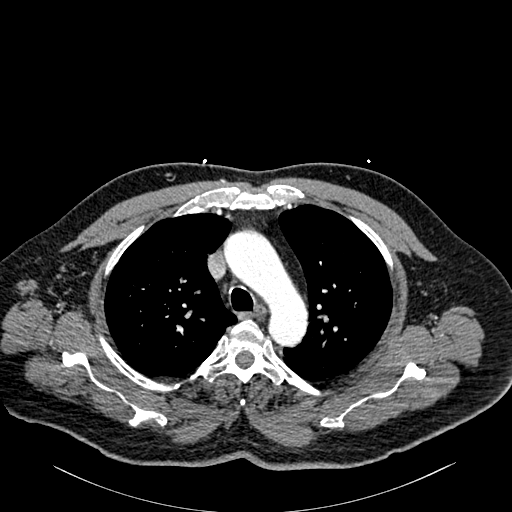
[im 285/342  lung]
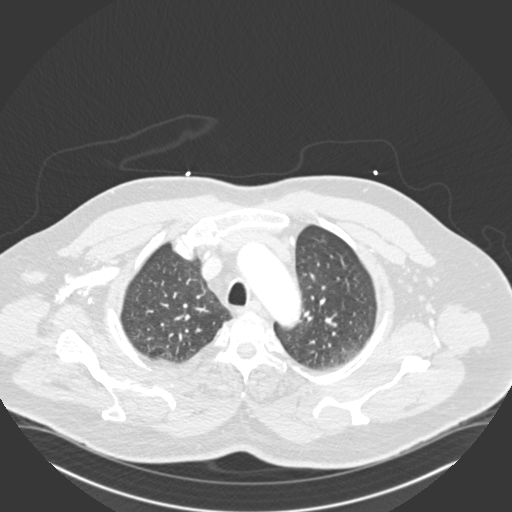
[im 304/342  mediastinal]
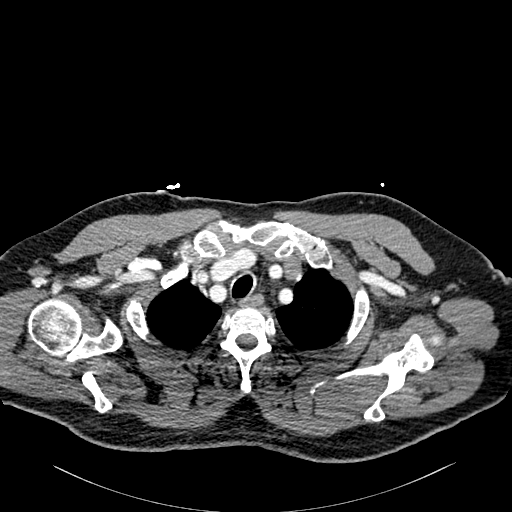
[im 323/342  lung]
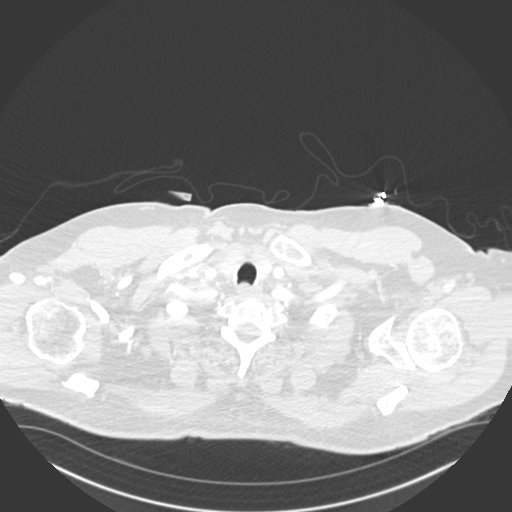

[Series 9: pe coronal mpr · coronal · 0.71mm/px · 1 of 185 slices shown]
[im 93/185  mediastinal]
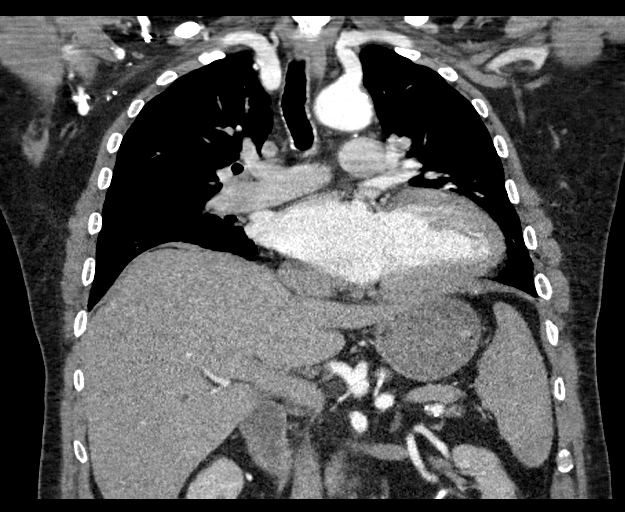

[18 of 36 positions shown; findings below may reference images not displayed]

FINDINGS: Cardiovascular: There is inadequate contrast opacification of
pulmonary arteries to evaluate for pulmonary embolus. Preferential
filling of the thoracic aorta is noted. No dissection is noted.
cm ascending thoracic aortic aneurysm is noted. Mild cardiomegaly is
noted. No pericardial effusion is noted.

Mediastinum/Nodes: No enlarged mediastinal, hilar, or axillary lymph
nodes. Thyroid gland, trachea, and esophagus demonstrate no
significant findings.

Lungs/Pleura: Lungs are clear. No pleural effusion or pneumothorax.

Upper Abdomen: No acute abnormality.

Musculoskeletal: No chest wall abnormality. No acute or significant
osseous findings.

Review of the MIP images confirms the above findings.
IMPRESSION: Inadequate contrast opacification of pulmonary arteries is noted
most likely due to timing issue, and therefore pulmonary embolus
cannot be excluded on the basis of this exam. There is no evidence
of thoracic aortic dissection.

4.4 cm ascending thoracic aortic aneurysm. Recommend annual imaging
followup by CTA or MRA. This recommendation follows [RL]
ACCF/AHA/AATS/ACR/ASA/SCA/KADIN/KADIN/KADIN/KADIN Guidelines for the
Diagnosis and Management of Patients with Thoracic Aortic Disease.
Circulation. [RL]; 121: [id number]. Aortic aneurysm NOS
([RL]-[RL]).

## 2021-06-06 IMAGING — CT CT ANGIO CHEST
2 of 8 series · 19 of 36 positions shown · IV contrast (omnipaque)
Comparison: CT [DATE], [DATE]

CLINICAL DATA: Hypoxia

EXAM:
CT ANGIOGRAPHY CHEST WITH CONTRAST
TECHNIQUE: Multidetector CT imaging of the chest was performed using the
standard protocol during bolus administration of intravenous
contrast. Multiplanar CT image reconstructions and MIPs were
obtained to evaluate the vascular anatomy.
CONTRAST:  80mL OMNIPAQUE IOHEXOL 350 MG/ML SOLN

[Series 5: pe thins · axial · 0.87mm/px · z∈[+1055,+1362]mm · 18 of 345 slices shown]
[im 19/345  lung]
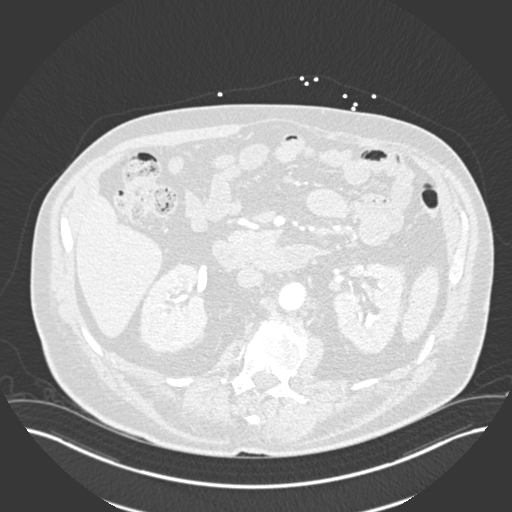
[im 37/345  mediastinal]
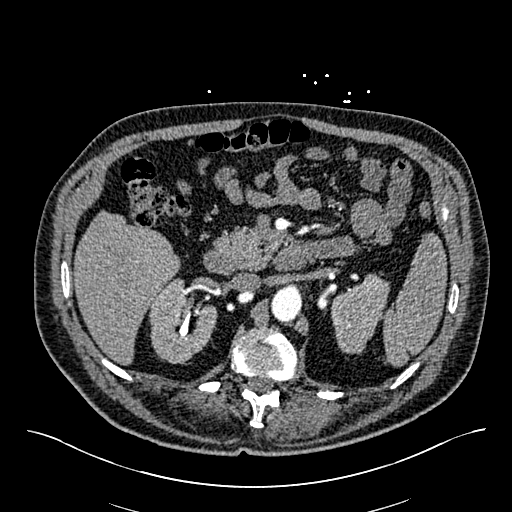
[im 55/345  lung]
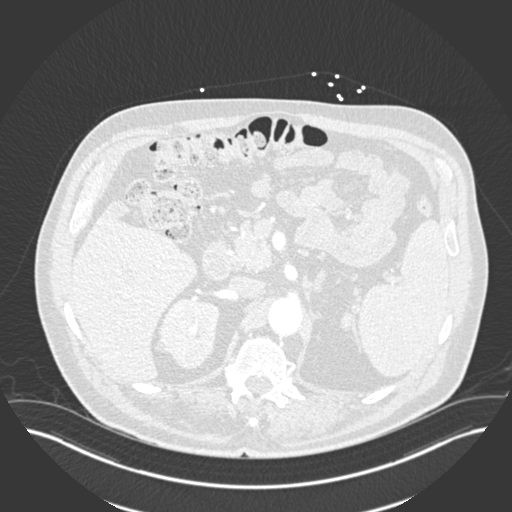
[im 73/345  mediastinal]
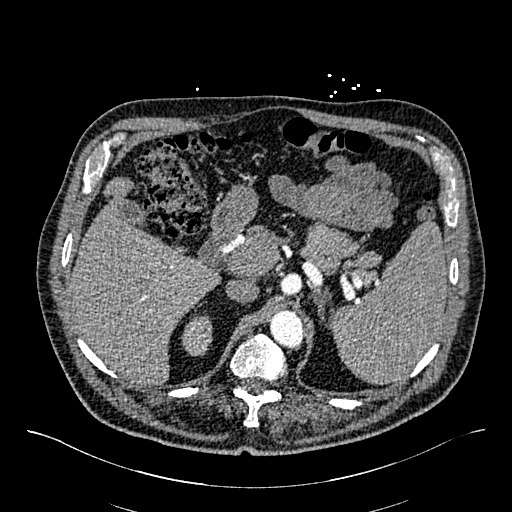
[im 91/345  lung]
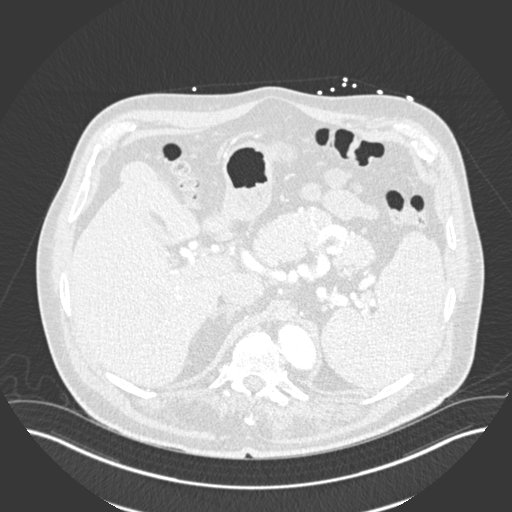
[im 109/345  mediastinal]
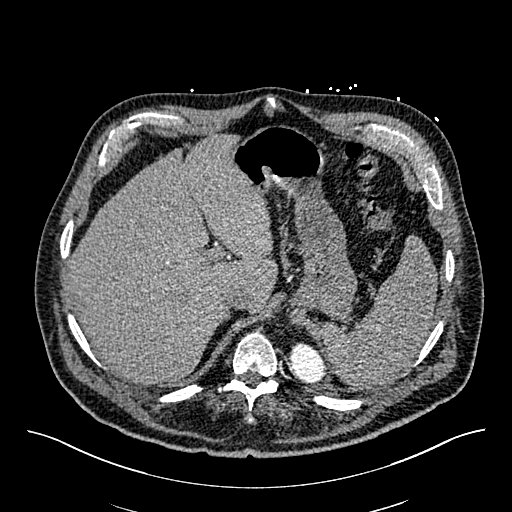
[im 127/345  lung]
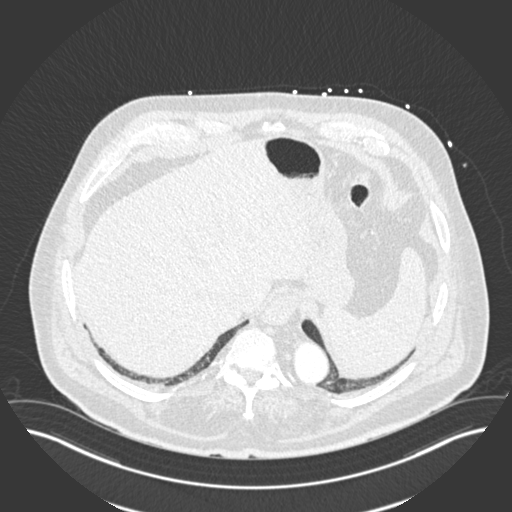
[im 145/345  mediastinal]
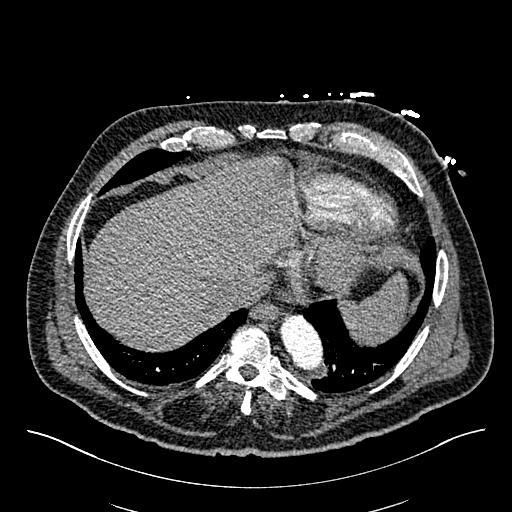
[im 163/345  lung]
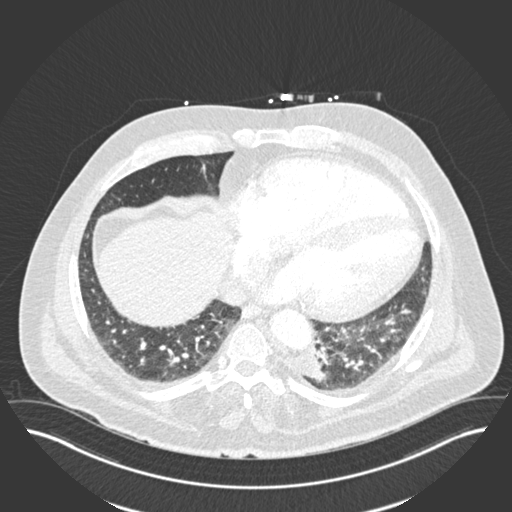
[im 182/345  mediastinal]
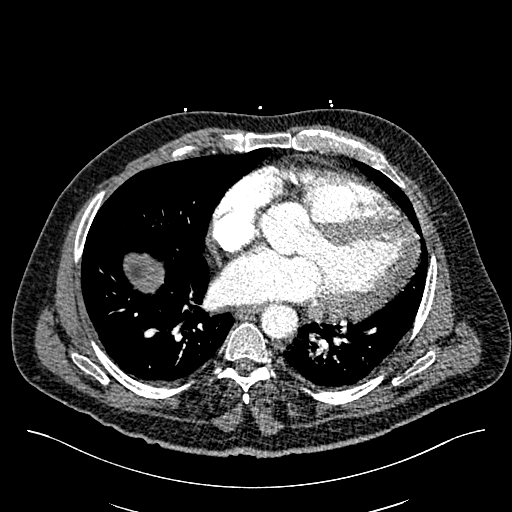
[im 200/345  lung]
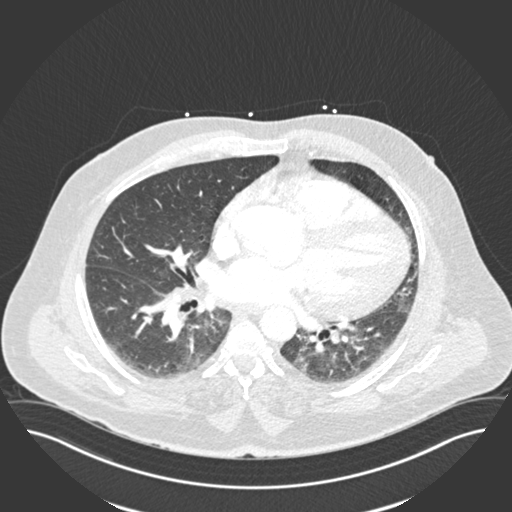
[im 218/345  mediastinal]
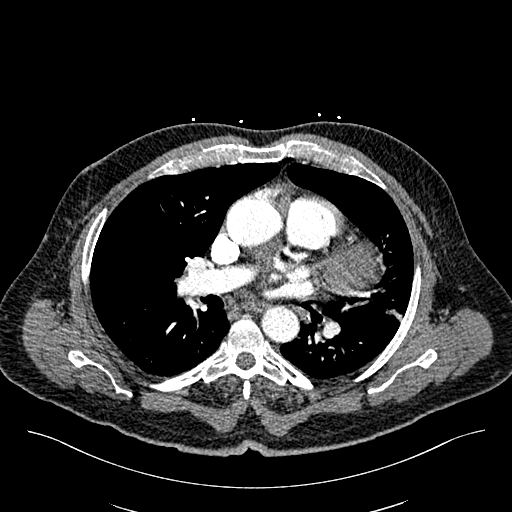
[im 236/345  lung]
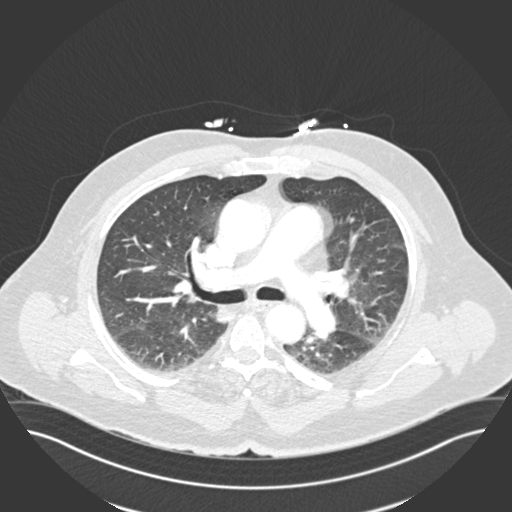
[im 254/345  mediastinal]
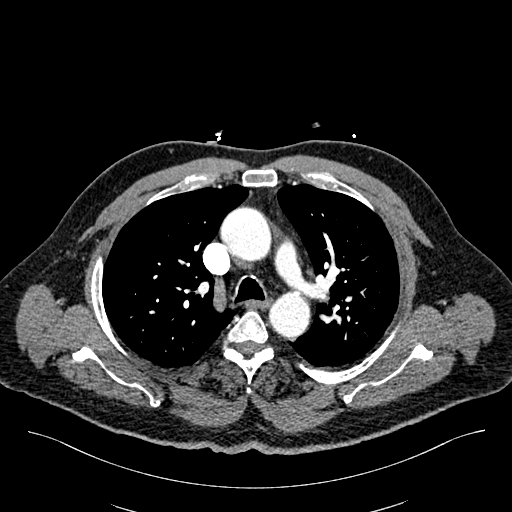
[im 272/345  lung]
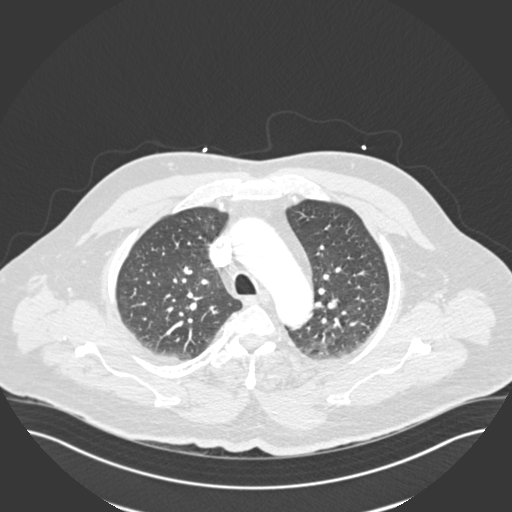
[im 290/345  mediastinal]
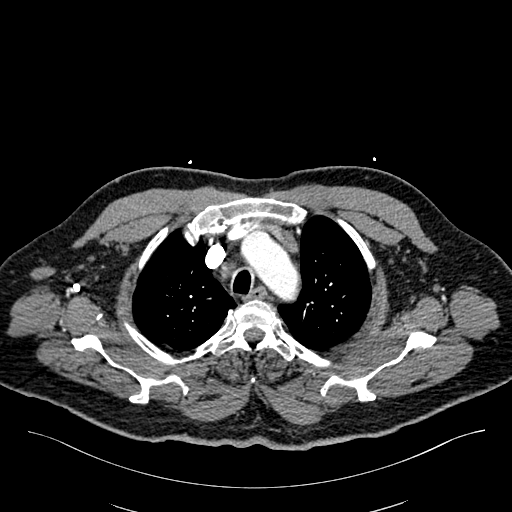
[im 308/345  lung]
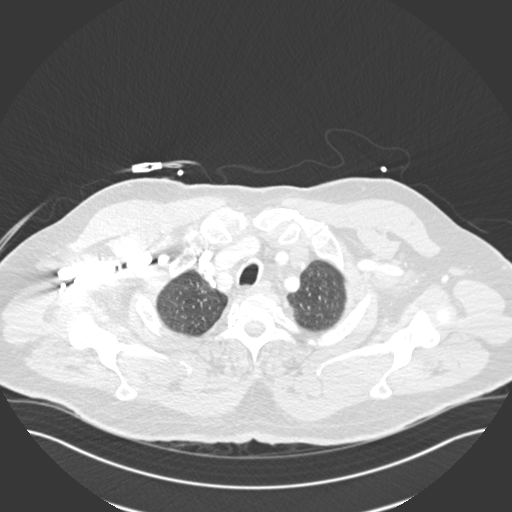
[im 326/345  mediastinal]
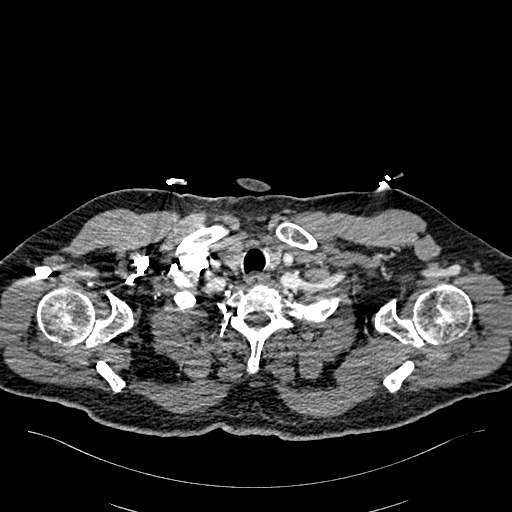

[Series 7: pe coronal mpr · coronal · 0.71mm/px · 1 of 175 slices shown]
[im 88/175  mediastinal]
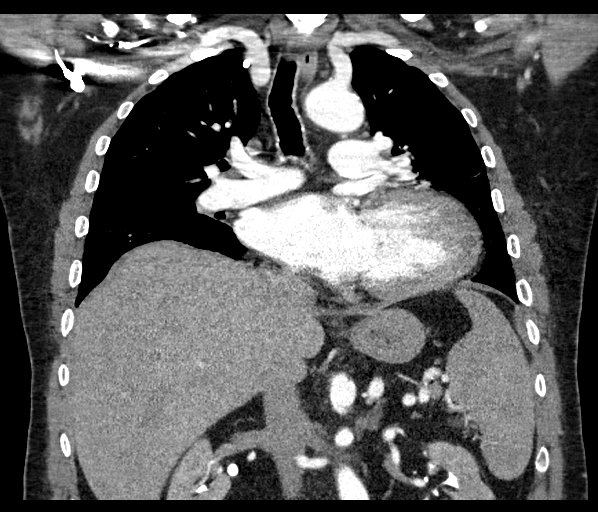

[19 of 36 positions shown; findings below may reference images not displayed]

FINDINGS: Cardiovascular: Satisfactory opacification of the pulmonary arteries
to the segmental level. No evidence of pulmonary embolism. Mild
cardiomegaly. Coronary vascular calcification. Ascending aortic
aneurysm measuring up to 4.4 cm. No dissection is seen. Mild
atherosclerosis. No pericardial effusion.

Mediastinum/Nodes: Midline trachea. No thyroid mass. No suspicious
adenopathy. Esophagus within normal limits.

Lungs/Pleura: Lungs are clear. No pleural effusion or pneumothorax.

Upper Abdomen: No acute abnormality

Musculoskeletal: No chest wall abnormality. No acute or significant
osseous findings.

Review of the MIP images confirms the above findings.
IMPRESSION: 1. Negative for acute pulmonary embolus or aortic dissection.
2. Aneurysmal dilatation of the ascending thoracic aorta up to
cm. Recommend annual imaging followup by CTA or MRA. This
recommendation follows [26]
ACCF/AHA/AATS/ACR/ASA/SCA/CHE WAH/CHE WAH/CHE WAH/CHE WAH Guidelines for the
Diagnosis and Management of Patients with Thoracic Aortic Disease.
Circulation. [26]; 121: [id number]. Aortic aneurysm NOS ([26]-[26])

Aortic Atherosclerosis ([26]-[26]).Aortic aneurysm NOS
([26]-[26]).

## 2021-06-06 MED ORDER — IOHEXOL 350 MG/ML SOLN
100.0000 mL | Freq: Once | INTRAVENOUS | Status: AC | PRN
  Administered 2021-06-06: 100 mL via INTRAVENOUS

## 2021-06-06 MED ORDER — SODIUM CHLORIDE 0.9 % IV BOLUS
500.0000 mL | Freq: Once | INTRAVENOUS | Status: AC
  Administered 2021-06-06: 500 mL via INTRAVENOUS

## 2021-06-06 MED ORDER — IOHEXOL 350 MG/ML SOLN
80.0000 mL | Freq: Once | INTRAVENOUS | Status: AC | PRN
  Administered 2021-06-06: 80 mL via INTRAVENOUS

## 2021-06-06 NOTE — ED Notes (Signed)
Pt appears to have improved breathing after being on O2. States he is doing better and able to rest

## 2021-06-06 NOTE — ED Notes (Signed)
Patient transported to CT 

## 2021-06-06 NOTE — ED Triage Notes (Signed)
Sob. 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 He is taking Eliquis 5 mg. O2 sats 88% at triage r/a.

## 2021-06-06 NOTE — ED Notes (Addendum)
Pt requesting something to eat. OK per RN Yvonna Alanis. Pt chose a [employer] Meatloaf. Pt is also requesting apple juice to drink.

## 2021-06-06 NOTE — ED Notes (Signed)
Pt up to bedside to urinate. Became increasingly short of breath, started throwing more PVCs

## 2021-06-06 NOTE — ED Provider Notes (Signed)
[facility] EMERGENCY DEPARTMENT Provider Note   CSN: [id number] Arrival date & time: 06/06/21  1629     History Chief Complaint  Patient presents with   Shortness of Breath    Ryan Glass is a 65 y.o. male.  The history is provided by the patient, medical records and a significant other. No language interpreter was used.  Shortness of Breath Severity:  Severe Onset quality:  Gradual Duration:  2 days Timing:  Constant Progression:  Waxing and waning Chronicity:  Recurrent Relieved by:  Rest Worsened by:  Exertion Ineffective treatments:  None tried Associated symptoms: chest pain   Associated symptoms: no abdominal pain, no cough, no diaphoresis, no fever, no headaches, no hemoptysis, no neck pain, no sputum production, no vomiting and no wheezing   Risk factors: hx of PE/DVT       Past Medical History:  Diagnosis Date   Ascending aortic aneurysm (HCC)    a. CT 05/2021 showed 4.4x4.4cm TAA.   Bilateral lower extremity edema    Chronic gout without tophus    followed by dr Sharmon Revere    (06-08-2020  per pt last episode right knee 3 wks ago)   CKD (chronic kidney disease), stage II    nephrology--- [place] PA (10-29-2019 note in epic scanned in  media)   Heart failure with mid-range ejection fraction [facility]) 05/27/2021   05/26/21: Left ventricular ejection fraction, by estimation, is 45 to 50%. There is severe asymmetric left ventricular hypertrophy. G1DD present.    Hypertension    followed by cardiology, dr t. turner   (05-14-2018 nuclear study in epic , normal perfusion with nuclear ef 61%)   Left hydrocele    Low serum potassium level 04/27/2014   OSA on CPAP    per pt uses every night   Palpitations followed by dr t. Mayford Knife   06-08-2020  still feels palipations due to PVCs when exertion but not with chest pain/ discomfort   Pneumonia due to COVID-19 virus    PVC's (premature ventricular contractions) cardiologist--- dr t. turner   Status post  PVC ablation by Dr. Ladona Ridgel 2019 with recurrence of frequent PVCs/bigeminy;  prior pseudobradycardia r/t pvcs   Rheumatoid arthritis involving multiple sites [facility])    rheumotology--- dr a. Sharmon Revere  ([facility] in [place])    Patient Active Problem List   Diagnosis Date Noted   CAD (coronary artery disease), non-obstructive 05/30/2021   Elevated troponin    Precordial chest pain    Heart failure with mid-range ejection fraction (HCC) 05/27/2021   LVH (left ventricular hypertrophy) due to hypertensive disease, with heart failure (HCC) 05/27/2021   Right ventricular dysfunction 05/27/2021   Pulmonary artery hypertension (HCC) 05/27/2021   Class 2 severe obesity with serious comorbidity and body mass index (BMI) of 35.0 to 35.9 in adult (HCC) 05/26/2021   Pulmonary embolism (HCC) 05/25/2021   Dyspnea on exertion    Thoracic aortic aneurysm without rupture (HCC) 02/01/2021   Ascending aortic aneurysm (HCC) 02/01/2021   Other hyperlipidemia 10/14/2020   Testicular swelling 05/14/2019   Pain in right knee 01/29/2019   PVC's (premature ventricular contractions)    PVC (premature ventricular contraction) 10/07/2018   Dilated aortic root (HCC)    Allergic rhinitis 01/10/2016   Chronic gout without tophus 01/10/2016   Drug therapy 01/10/2016   OSA (obstructive sleep apnea) 01/10/2016   Colonic polyp 12/14/2015   Rt groin pain 12/14/2015   Scoliosis of thoracolumbar spine 12/14/2015   Right lower quadrant abdominal pain  04/20/2015   Arthritis of knee 02/22/2015   Edema of extremities 02/22/2015   Essential hypertension 02/22/2015   Preventative health care 02/22/2015   Sleep apnea 04/27/2014   Bradycardia by electrocardiogram 04/16/2014   Chest pain 04/16/2014    Past Surgical History:  Procedure Laterality Date   HYDROCELE EXCISION Left 06/14/2020   Procedure: LEFT  HYDROCELECTOMY ADULT;  Surgeon: Noel Christmas, MD;  Location: [facility];  Service: Urology;  Laterality:  Left;   INCISIONAL HERNIA REPAIR  02-23-2016   @[facility]    LAPAROSCOPIC   LAPAROSCOPIC INGUINAL HERNIA REPAIR Bilateral 08-22-2015  @[facility]    AND UMBILICAL HERNIA REPAIR   PVC ABLATION N/A 10/07/2018   Procedure: PVC ABLATION;  Surgeon: , MD;  Location: [facility] INVASIVE CV [facility];  Service: Cardiovascular;  Laterality: N/A;   RIGHT/LEFT HEART CATH AND CORONARY ANGIOGRAPHY N/A 05/29/2021   Procedure: RIGHT/LEFT HEART CATH AND CORONARY ANGIOGRAPHY;  Surgeon: Marinus Maw, MD;  Location: [facility] INVASIVE CV LAB;  Service: Cardiovascular;  Laterality: N/A;   UMBILICAL HERNIA REPAIR  child       Family History  Problem Relation Age of Onset   Cardiomyopathy Mother    Heart attack Father     Social History   Tobacco Use   Smoking status: Never   Smokeless tobacco: Never  Vaping Use   Vaping Use: Never used  Substance Use Topics   Alcohol use: Yes    Alcohol/week: 5.0 - 7.0 standard drinks    Types: 5 - 7 Cans of beer per week   Drug use: Never    Home Medications Prior to Admission medications   Medication Sig Start Date End Date Taking? Authorizing Provider  acetaminophen-codeine (TYLENOL #3) 300-30 MG tablet Take 1-2 tablets by mouth as needed for moderate pain. 05/06/19   [provider]  albuterol (VENTOLIN HFA) 108 (90 Base) MCG/ACT inhaler Inhale 2 puffs into the lungs every 4 (four) hours as needed for wheezing or shortness of breath. 03/02/21   Betancourt, 05/08/19, NP  apixaban (ELIQUIS) 5 MG TABS tablet Take 2 tablets (10 mg total) by mouth 2 (two) times daily. 05/30/21   Maness, Jarold Song, MD  apixaban (ELIQUIS) 5 MG TABS tablet Take 1 tablet (5 mg total) by mouth 2 (two) times daily. 06/06/21   Maness, Loistine Chance, MD  atorvastatin (LIPITOR) 40 MG tablet Take 1 tablet (40 mg total) by mouth daily. 01/09/21   Loistine Chance, MD  chlorthalidone (HYGROTON) 25 MG tablet Take 25 mg by mouth daily. 04/02/21   [provider]  colchicine 0.6 MG tablet Take 1-2  tablets by mouth daily as needed (gout).  01/31/16   [provider]  diclofenac (VOLTAREN) 75 MG EC tablet Take 75 mg by mouth 2 (two) times daily. 08/03/20   [provider]  diltiazem (CARDIZEM CD) 180 MG 24 hr capsule Take 1 capsule (180 mg total) by mouth daily. 02/27/21   08/05/20, MD  fluticasone (FLONASE) 50 MCG/ACT nasal spray SPRAY ONE SPRAY IN EACH NOSTRIL TWICE DAILY 03/22/20   Betancourt, Quintella Reichert, NP  Fluticasone-Salmeterol (ADVAIR) 250-50 MCG/DOSE AEPB Inhale 1 puff into the lungs every 12 (twelve) hours. 11/28/20   Jarold Song, MD  folic acid (FOLVITE) 1 MG tablet Take 1 mg by mouth daily. 01/29/20   [provider]  gabapentin (NEURONTIN) 600 MG tablet Take 600 mg by mouth 3 (three) times daily.  02/04/19   [provider]  HUMIRA PEN 40 MG/0.4ML PNKT  Inject 40 mg as directed every 14 (fourteen) days. 04/09/21   [provider]  hydrALAZINE (APRESOLINE) 100 MG tablet TAKE ONE TABLET BY MOUTH THREE TIMES A DAY Patient taking differently: Take 100 mg by mouth 3 (three) times daily. 04/03/21   Dunn, Tacey Ruiz, PA-C  losartan (COZAAR) 100 MG tablet Take 1 tablet (100 mg total) by mouth daily. Please keep upcoming appt in January 2022 with Dr. Mayford Knife before anymore refills. Thank you 12/16/20   Quintella Reichert, MD  methotrexate 2.5 MG tablet Take 15 mg by mouth every Friday. 06/22/20   [provider]  montelukast (SINGULAIR) 10 MG tablet Take 1 tablet (10 mg total) by mouth at bedtime. 05/31/21   Betancourt, Jarold Song, NP  nebivolol (BYSTOLIC) 5 MG tablet TAKE TWO TABLETS BY MOUTH EVERY NIGHT AT BEDTIME 03/06/21   Quintella Reichert, MD  Potassium Chloride ER 20 MEQ TBCR Take 1 tablet in the morning and 1 at night 05/30/21   Maness, Loistine Chance, MD  potassium chloride SA (KLOR-CON M20) 20 MEQ tablet Take 1 tablet (20 mEq total) by mouth 2 (two) times daily. 05/30/21   Maness, Loistine Chance, MD  predniSONE (DELTASONE) 5 MG tablet Take 10 mg by mouth daily.   01/29/20   [provider]  sildenafil (REVATIO) 20 MG tablet Take 20 mg by mouth daily as needed (erectile dysfunction). 08/03/20   [provider]  spironolactone (ALDACTONE) 25 MG tablet Take 0.5 tablets (12.5 mg total) by mouth daily. 05/31/21   Maness, Loistine Chance, MD    Allergies    Amlodipine besylate and Aspirin  Review of Systems   Review of Systems  Constitutional:  Negative for chills, diaphoresis, fatigue and fever.  HENT:  Negative for congestion.   Respiratory:  Positive for chest tightness and shortness of breath. Negative for cough, hemoptysis, sputum production, wheezing and stridor.   Cardiovascular:  Positive for chest pain and palpitations. Negative for leg swelling.  Gastrointestinal:  Negative for abdominal pain, constipation, diarrhea, nausea and vomiting.  Genitourinary:  Negative for flank pain.  Musculoskeletal:  Negative for back pain and neck pain.  Skin:  Negative for wound.  Neurological:  Negative for syncope, light-headedness and headaches.  Psychiatric/Behavioral:  Negative for agitation.   All other systems reviewed and are negative.  Physical Exam Updated Vital Signs BP (!) 147/76 (BP Location: Left Arm)   Pulse 94   Temp 98.2 F (36.8 C) (Oral)   Resp (!) 22   Ht  (1.778 m)   Wt 118.2 kg   SpO2 (!) 88%   BMI 37.39 kg/m   Physical Exam Vitals and nursing note reviewed.  Constitutional:      General: He is not in acute distress.    Appearance: He is well-developed. He is not ill-appearing, toxic-appearing or diaphoretic.  HENT:     Head: Normocephalic and atraumatic.     Mouth/Throat:     Pharynx: No oropharyngeal exudate.  Eyes:     Conjunctiva/sclera: Conjunctivae normal.     Pupils: Pupils are equal, round, and reactive to light.  Cardiovascular:     Rate and Rhythm: Regular rhythm. Tachycardia present. Extrasystoles are present.    Heart sounds: No murmur heard. Pulmonary:     Effort: Pulmonary effort is normal.  Tachypnea present. No respiratory distress.     Breath sounds: Rhonchi present. No wheezing or rales.  Chest:     Chest wall: No tenderness.  Abdominal:     Palpations: Abdomen is soft.  Tenderness: There is no abdominal tenderness.  Musculoskeletal:     Cervical back: Neck supple.     Right lower leg: No tenderness. No edema.     Left lower leg: No tenderness. No edema.  Skin:    General: Skin is warm and dry.     Capillary Refill: Capillary refill takes less than 2 seconds.     Findings: No erythema.  Neurological:     Mental Status: He is alert.    ED Results / Procedures / Treatments   Labs (all labs ordered are listed, but only abnormal results are displayed) Labs Reviewed  CBC WITH DIFFERENTIAL/PLATELET - Abnormal; Notable for the following components:      Result Value   RBC 3.77 (*)    Hemoglobin 12.5 (*)    HCT 37.1 (*)    All other components within normal limits  COMPREHENSIVE METABOLIC PANEL - Abnormal; Notable for the following components:   CO2 20 (*)    Glucose, Bld 129 (*)    BUN 27 (*)    Creatinine, Ser 1.65 (*)    GFR, Estimated 46 (*)    All other components within normal limits  LACTIC ACID, PLASMA - Abnormal; Notable for the following components:   Lactic Acid, Venous 2.2 (*)    All other components within normal limits  RESP PANEL BY RT-PCR (FLU A&B, COVID) ARPGX2  BRAIN NATRIURETIC PEPTIDE  LACTIC ACID, PLASMA  LIPASE, BLOOD  TSH  MAGNESIUM  TROPONIN I (HIGH SENSITIVITY)  TROPONIN I (HIGH SENSITIVITY)    EKG EKG Interpretation  Date/Time:  Tuesday June 06 2021 16:47:19 EDT Ventricular Rate:  111 PR Interval:  193 QRS Duration: 174 QT Interval:  460 QTC Calculation: 521 R Axis:   64 Text Interpretation: Sinus tachycardia Paired ventricular premature complexes LAE, consider biatrial enlargement Right bundle branch block When compared to prior, similar numerous PVC. No STEMI Confirmed by Theda Belfast ([id number]) on 06/06/2021 5:04:37  PM  Radiology CT Angio Chest PE W and/or Wo Contrast  Result Date: 06/06/2021 CLINICAL DATA:  Hypoxia EXAM: CT ANGIOGRAPHY CHEST WITH CONTRAST TECHNIQUE: Multidetector CT imaging of the chest was performed using the standard protocol during bolus administration of intravenous contrast. Multiplanar CT image reconstructions and MIPs were obtained to evaluate the vascular anatomy. CONTRAST:  69mL OMNIPAQUE IOHEXOL 350 MG/ML SOLN COMPARISON:  CT 06/06/2021, 05/24/2021 FINDINGS: Cardiovascular: Satisfactory opacification of the pulmonary arteries to the segmental level. No evidence of pulmonary embolism. Mild cardiomegaly. Coronary vascular calcification. Ascending aortic aneurysm measuring up to 4.4 cm. No dissection is seen. Mild atherosclerosis. No pericardial effusion. Mediastinum/Nodes: Midline trachea. No thyroid mass. No suspicious adenopathy. Esophagus within normal limits. Lungs/Pleura: Lungs are clear. No pleural effusion or pneumothorax. Upper Abdomen: No acute abnormality Musculoskeletal: No chest wall abnormality. No acute or significant osseous findings. Review of the MIP images confirms the above findings. IMPRESSION: 1. Negative for acute pulmonary embolus or aortic dissection. 2. Aneurysmal dilatation of the ascending thoracic aorta up to 4.4 cm. Recommend annual imaging followup by CTA or MRA. This recommendation follows 2010 ACCF/AHA/AATS/ACR/ASA/SCA/SCAI/SIR/STS/SVM Guidelines for the Diagnosis and Management of Patients with Thoracic Aortic Disease. Circulation. 2010; 121: R416-L845. Aortic aneurysm NOS (ICD10-I71.9) Aortic Atherosclerosis (ICD10-I70.0).Aortic aneurysm NOS (ICD10-I71.9). Electronically Signed   By: Jasmine Pang M.D.   On: 06/06/2021 21:20   CT Angio Chest PE W and/or Wo Contrast  Result Date: 06/06/2021 CLINICAL DATA:  Hypoxia. EXAM: CT ANGIOGRAPHY CHEST WITH CONTRAST TECHNIQUE: Multidetector CT imaging of the chest was performed using the  standard protocol during bolus  administration of intravenous contrast. Multiplanar CT image reconstructions and MIPs were obtained to evaluate the vascular anatomy. CONTRAST:  OMNIPAQUE IOHEXOL 350 MG/ML SOLN COMPARISON:  May 24, 2021. FINDINGS: Cardiovascular: There is inadequate contrast opacification of pulmonary arteries to evaluate for pulmonary embolus. Preferential filling of the thoracic aorta is noted. No dissection is noted. 4.4 cm ascending thoracic aortic aneurysm is noted. Mild cardiomegaly is noted. No pericardial effusion is noted. Mediastinum/Nodes: No enlarged mediastinal, hilar, or axillary lymph nodes. Thyroid gland, trachea, and esophagus demonstrate no significant findings. Lungs/Pleura: Lungs are clear. No pleural effusion or pneumothorax. Upper Abdomen: No acute abnormality. Musculoskeletal: No chest wall abnormality. No acute or significant osseous findings. Review of the MIP images confirms the above findings. IMPRESSION: Inadequate contrast opacification of pulmonary arteries is noted most likely due to timing issue, and therefore pulmonary embolus cannot be excluded on the basis of this exam. There is no evidence of thoracic aortic dissection. 4.4 cm ascending thoracic aortic aneurysm. Recommend annual imaging followup by CTA or MRA. This recommendation follows 2010 ACCF/AHA/AATS/ACR/ASA/SCA/SCAI/SIR/STS/SVM Guidelines for the Diagnosis and Management of Patients with Thoracic Aortic Disease. Circulation. 2010; 121: R604-V409. Aortic aneurysm NOS (ICD10-I71.9). Electronically Signed   By: Lupita Raider M.D.   On: 06/06/2021 18:01    Procedures Procedures   Medications Ordered in ED Medications  iohexol (OMNIPAQUE) 350 MG/ML injection 100 mL (100 mLs Intravenous Contrast Given 06/06/21 1735)  sodium chloride 0.9 % bolus 500 mL (500 mLs Intravenous New Bag/Given 06/06/21 2114)  iohexol (OMNIPAQUE) 350 MG/ML injection 80 mL (80 mLs Intravenous Contrast Given 06/06/21 2105)     ED Course  I have reviewed  the triage vital signs and the nursing notes.  Pertinent labs & imaging results that were available during my care of the patient were reviewed by me and considered in my medical decision making (see chart for details).    MDM Rules/Calculators/A&P                          EZERIAH LUTY is a 64 y.o. male with a past medical history significant for chronic PVCs, known ascending aortic root aneurysm, CAD with recent cath showing 20 and 30% blockages managed medically, obesity, LVH, hypertension, hyperlipidemia, and recent pulmonary embolism on Eliquis therapy who presents at the direction of both PCP and cardiologist for evaluation admission for recurrent chest pain, shortness of breath, and hypoxia at home.  According to family, patient was admitted and was discharged about a week ago after having pulmonary embolism causing symptoms.  Patient also had a heart cath that showed 20 and 30% blockages in the various vessels but no PCI was needed at that time.  Patient chronically has PVCs and palpitations.  Patient reports that over the last day or 2, he has been having worsening exertional shortness of breath and chest pain with a burning and aching in his central chest.  It does not radiate.  Does not go to his back.  Denies any new injuries.  According to family, oxygen saturations were in the 70s at home when he tried to ambulate yesterday and was persistent in the 80s on room air.  Patient does not take oxygen at home.  When they spoke to the PCP and cardiology team, they told him to come in for admission.  On arrival, oxygen is in the 80s and he was started on 4 L nasal cannula to maintain oxygen saturations in  the 90s.  He is tachypneic and intermittently had tachycardia with numerous PVCs.  There were several episodes several beat runs of PVCs concerning for V. tach.  On exam, lungs have some mild coarseness but otherwise no significant wheezing or rales.  Chest was nontender and I cannot reproduce  his discomfort.  No murmur on my exam.  Intact sensation, strength, and pulses in extremities.  Abdomen nontender.  Patient resting comfortably but tachypneic.  Clinically I am concerned about either worsened pulmonary embolism, new heart strain, or a cardiac cause of symptoms.  We will get screening labs including BNP, troponin, and a COVID swab for admission.  As he is hypoxic, he will need admission.  We will get a CT PE study to make sure there is not a large new clot.  At rest he is not having any chest pain.  Anticipate admission after work-up is completed.  9:27 PM Repeat CT scan does not show evidence of new pulmonary embolism.  Thus, patient has not failed outpatient Eliquis.  Unclear why he is hypoxic but he is requiring oxygen now.  He will need admission.  As patient was just discharged from the family medicine teaching service 1 week ago, will call them back for admission.   Final Clinical Impression(s) / ED Diagnoses Final diagnoses:  Hypoxia  SOB (shortness of breath)  Exertional chest pain  Frequent PVCs    Rx / DC Orders ED Discharge Orders     None      Clinical Impression: 1. Hypoxia   2. SOB (shortness of breath)   3. Exertional chest pain   4. Frequent PVCs     Disposition: Admit  This note was prepared with assistance of Dragon voice recognition software. Occasional wrong-word or sound-a-like substitutions may have occurred due to the inherent limitations of voice recognition software.     Alyzae Hawkey, Canary Brim, MD 06/06/21 2248

## 2021-06-06 NOTE — ED Notes (Signed)
Accompanied pt to CT scan.

## 2021-06-07 ENCOUNTER — Encounter: Payer: Self-pay | Admitting: Family Medicine

## 2021-06-07 ENCOUNTER — Inpatient Hospital Stay (HOSPITAL_COMMUNITY): Payer: No Typology Code available for payment source

## 2021-06-07 ENCOUNTER — Encounter (HOSPITAL_COMMUNITY): Payer: Self-pay | Admitting: Family Medicine

## 2021-06-07 DIAGNOSIS — G4733 Obstructive sleep apnea (adult) (pediatric): Secondary | ICD-10-CM | POA: Diagnosis present

## 2021-06-07 DIAGNOSIS — Z86711 Personal history of pulmonary embolism: Secondary | ICD-10-CM | POA: Diagnosis not present

## 2021-06-07 DIAGNOSIS — I493 Ventricular premature depolarization: Secondary | ICD-10-CM | POA: Diagnosis present

## 2021-06-07 DIAGNOSIS — Z7952 Long term (current) use of systemic steroids: Secondary | ICD-10-CM | POA: Diagnosis not present

## 2021-06-07 DIAGNOSIS — M0609 Rheumatoid arthritis without rheumatoid factor, multiple sites: Secondary | ICD-10-CM | POA: Insufficient documentation

## 2021-06-07 DIAGNOSIS — I5022 Chronic systolic (congestive) heart failure: Secondary | ICD-10-CM | POA: Diagnosis present

## 2021-06-07 DIAGNOSIS — Z8616 Personal history of COVID-19: Secondary | ICD-10-CM | POA: Diagnosis not present

## 2021-06-07 DIAGNOSIS — I2510 Atherosclerotic heart disease of native coronary artery without angina pectoris: Secondary | ICD-10-CM | POA: Diagnosis present

## 2021-06-07 DIAGNOSIS — R0602 Shortness of breath: Secondary | ICD-10-CM | POA: Diagnosis present

## 2021-06-07 DIAGNOSIS — Z8249 Family history of ischemic heart disease and other diseases of the circulatory system: Secondary | ICD-10-CM | POA: Diagnosis not present

## 2021-06-07 DIAGNOSIS — I2782 Chronic pulmonary embolism: Secondary | ICD-10-CM | POA: Diagnosis not present

## 2021-06-07 DIAGNOSIS — F419 Anxiety disorder, unspecified: Secondary | ICD-10-CM | POA: Diagnosis present

## 2021-06-07 DIAGNOSIS — J9620 Acute and chronic respiratory failure, unspecified whether with hypoxia or hypercapnia: Secondary | ICD-10-CM | POA: Diagnosis present

## 2021-06-07 DIAGNOSIS — I2609 Other pulmonary embolism with acute cor pulmonale: Secondary | ICD-10-CM | POA: Diagnosis not present

## 2021-06-07 DIAGNOSIS — M1A0720 Idiopathic chronic gout, left ankle and foot, without tophus (tophi): Secondary | ICD-10-CM | POA: Diagnosis present

## 2021-06-07 DIAGNOSIS — I2721 Secondary pulmonary arterial hypertension: Secondary | ICD-10-CM | POA: Diagnosis present

## 2021-06-07 DIAGNOSIS — I130 Hypertensive heart and chronic kidney disease with heart failure and stage 1 through stage 4 chronic kidney disease, or unspecified chronic kidney disease: Secondary | ICD-10-CM | POA: Diagnosis present

## 2021-06-07 DIAGNOSIS — J9621 Acute and chronic respiratory failure with hypoxia: Secondary | ICD-10-CM | POA: Diagnosis present

## 2021-06-07 DIAGNOSIS — R0600 Dyspnea, unspecified: Secondary | ICD-10-CM | POA: Diagnosis not present

## 2021-06-07 DIAGNOSIS — Z79899 Other long term (current) drug therapy: Secondary | ICD-10-CM | POA: Diagnosis not present

## 2021-06-07 DIAGNOSIS — R0902 Hypoxemia: Secondary | ICD-10-CM | POA: Diagnosis not present

## 2021-06-07 DIAGNOSIS — I712 Thoracic aortic aneurysm, without rupture: Secondary | ICD-10-CM | POA: Diagnosis present

## 2021-06-07 DIAGNOSIS — N179 Acute kidney failure, unspecified: Secondary | ICD-10-CM | POA: Diagnosis present

## 2021-06-07 DIAGNOSIS — M069 Rheumatoid arthritis, unspecified: Secondary | ICD-10-CM | POA: Diagnosis present

## 2021-06-07 DIAGNOSIS — Z7951 Long term (current) use of inhaled steroids: Secondary | ICD-10-CM | POA: Diagnosis not present

## 2021-06-07 DIAGNOSIS — N1831 Chronic kidney disease, stage 3a: Secondary | ICD-10-CM | POA: Diagnosis present

## 2021-06-07 DIAGNOSIS — Z7901 Long term (current) use of anticoagulants: Secondary | ICD-10-CM | POA: Diagnosis not present

## 2021-06-07 DIAGNOSIS — E7849 Other hyperlipidemia: Secondary | ICD-10-CM | POA: Diagnosis present

## 2021-06-07 HISTORY — DX: Personal history of COVID-19: Z86.16

## 2021-06-07 LAB — RETICULOCYTES
Immature Retic Fract: 8.2 % (ref 2.3–15.9)
RBC.: 3.65 MIL/uL — ABNORMAL LOW (ref 4.22–5.81)
Retic Count, Absolute: 78.1 10*3/uL (ref 19.0–186.0)
Retic Ct Pct: 2.1 % (ref 0.4–3.1)

## 2021-06-07 LAB — BASIC METABOLIC PANEL
Anion gap: 9 (ref 5–15)
BUN: 20 mg/dL (ref 8–23)
CO2: 21 mmol/L — ABNORMAL LOW (ref 22–32)
Calcium: 9.3 mg/dL (ref 8.9–10.3)
Chloride: 108 mmol/L (ref 98–111)
Creatinine, Ser: 1.26 mg/dL — ABNORMAL HIGH (ref 0.61–1.24)
GFR, Estimated: >60 mL/min (ref >60)
Glucose, Bld: 86 mg/dL (ref 70–99)
Potassium: 3.7 mmol/L (ref 3.5–5.1)
Sodium: 138 mmol/L (ref 135–145)

## 2021-06-07 LAB — CBC
HCT: 34.8 % — ABNORMAL LOW (ref 39.0–52.0)
Hemoglobin: 11.4 g/dL — ABNORMAL LOW (ref 13.0–17.0)
MCH: 33 pg (ref 26.0–34.0)
MCHC: 32.8 g/dL (ref 30.0–36.0)
MCV: 100.9 fL — ABNORMAL HIGH (ref 80.0–100.0)
Platelets: 211 10*3/uL (ref 150–400)
RBC: 3.45 MIL/uL — ABNORMAL LOW (ref 4.22–5.81)
RDW: 15.1 % (ref 11.5–15.5)
WBC: 4.2 10*3/uL (ref 4.0–10.5)
nRBC: 0 % (ref 0.0–0.2)

## 2021-06-07 LAB — VITAMIN B12: Vitamin B-12: 146 pg/mL — ABNORMAL LOW (ref 180–914)

## 2021-06-07 LAB — CORTISOL-AM, BLOOD: Cortisol - AM: 7.9 ug/dL (ref 6.7–22.6)

## 2021-06-07 IMAGING — CT CT CHEST HIGH RESOLUTION W/O CM
2 of 5 series · 15 of 36 positions shown, 18 images · non-contrast
Comparison: [DATE], [DATE], [DATE]

CLINICAL DATA: Interstitial lung disease

EXAM:
CT CHEST WITHOUT CONTRAST
TECHNIQUE: Multidetector CT imaging of the chest was performed following the
standard protocol without intravenous contrast. High resolution
imaging of the lungs, as well as inspiratory and expiratory imaging,
was performed.

[Series 5: high resolution 2.0 b30f · axial · 0.91mm/px · z∈[-58,+216]mm · 12 of 153 slices shown, 15 images]
[im 8/153  mediastinal]
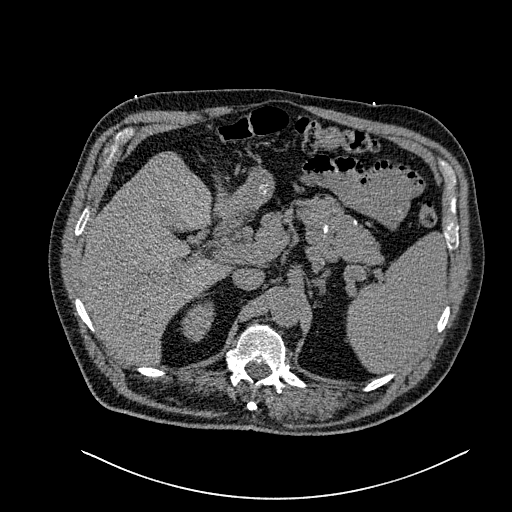
[im 8/153  lung]
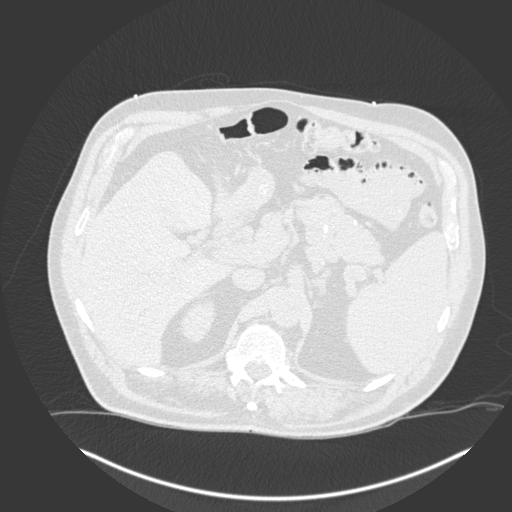
[im 22/153  lung]
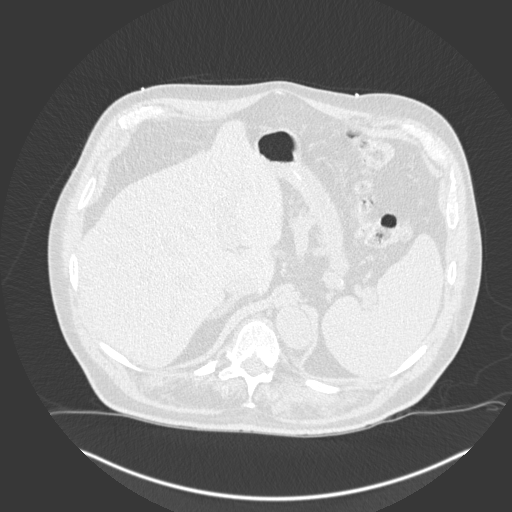
[im 37/153  lung]
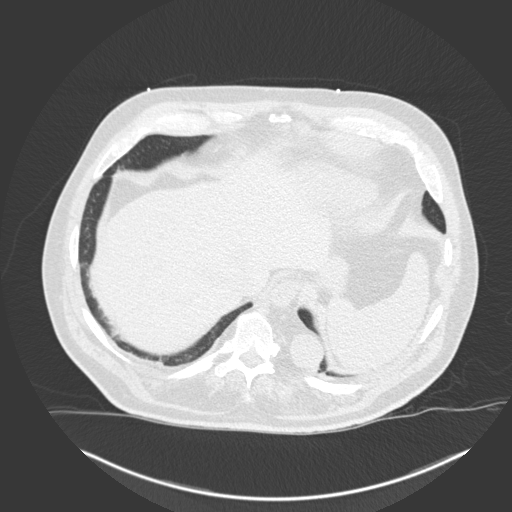
[im 44/153  lung]
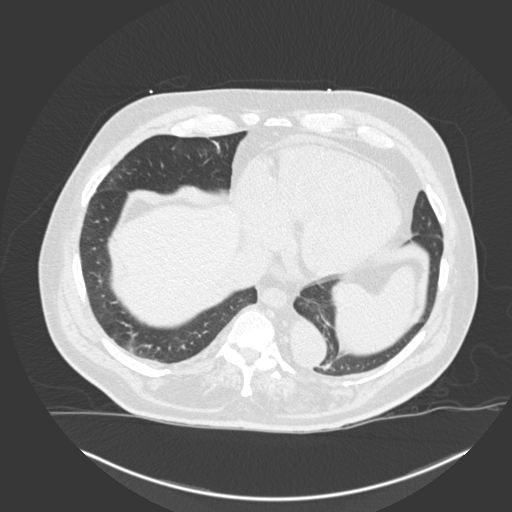
[im 58/153  mediastinal]
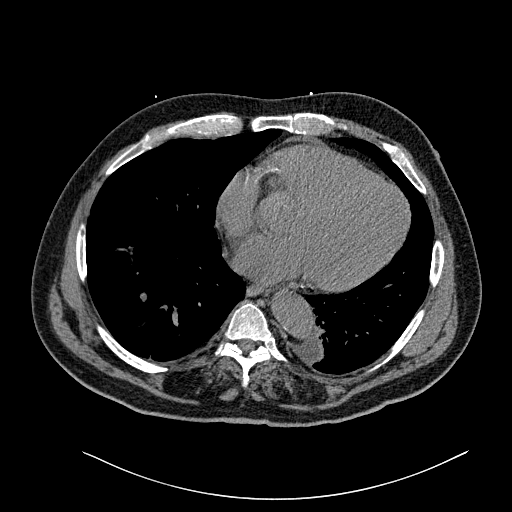
[im 58/153  lung]
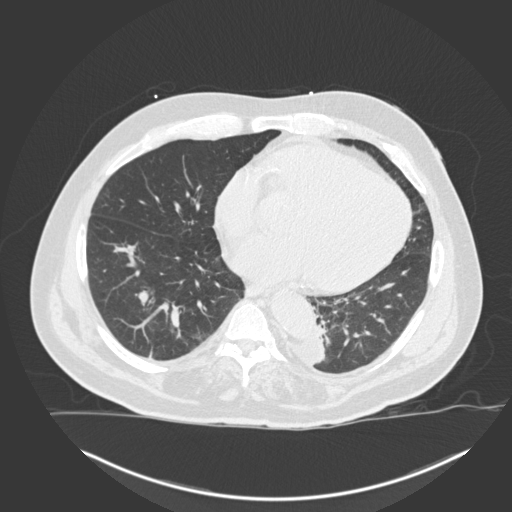
[im 73/153  lung]
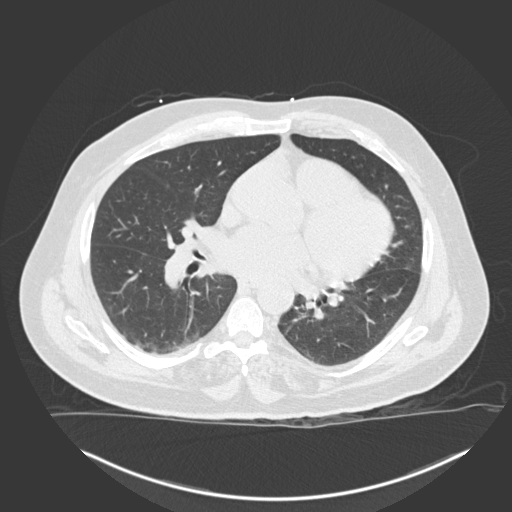
[im 80/153  lung]
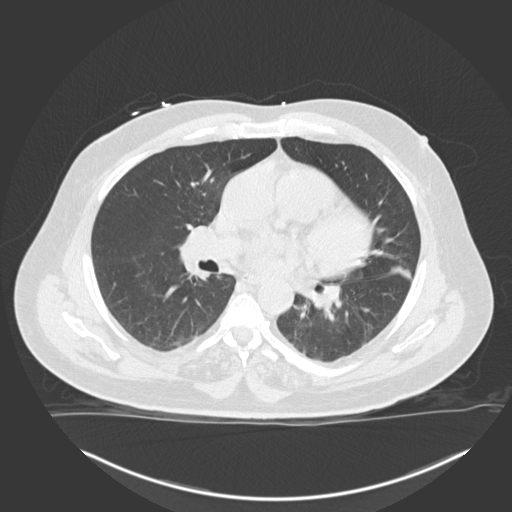
[im 95/153  lung]
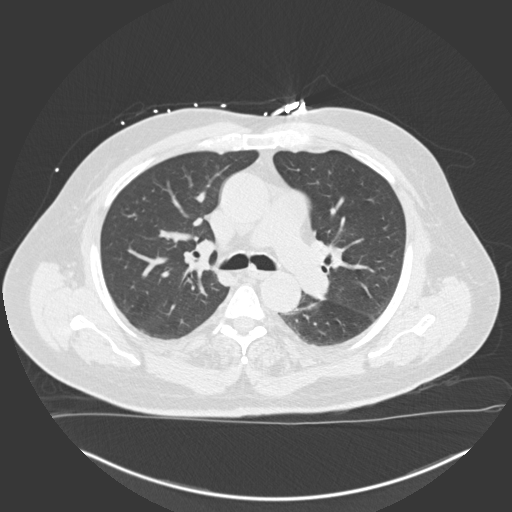
[im 109/153  mediastinal]
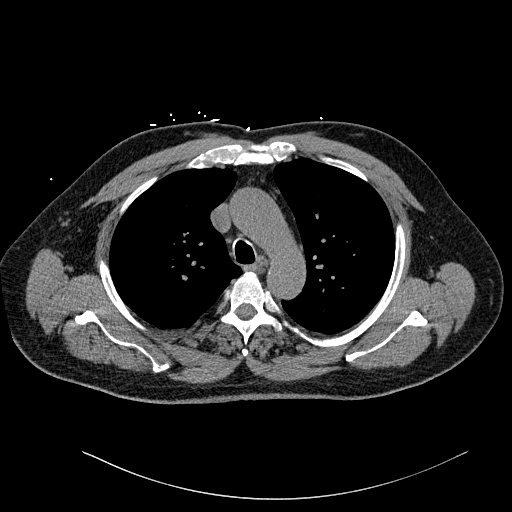
[im 109/153  lung]
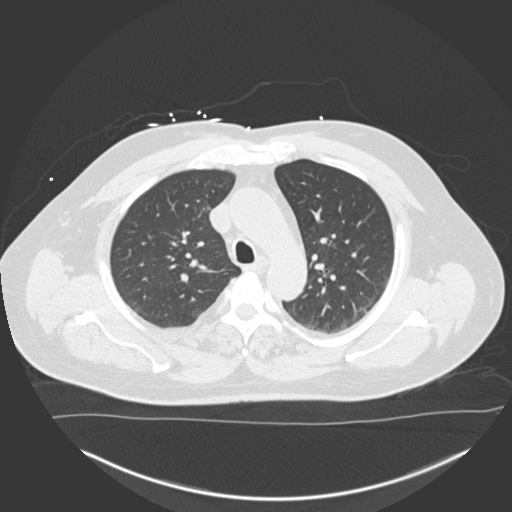
[im 116/153  lung]
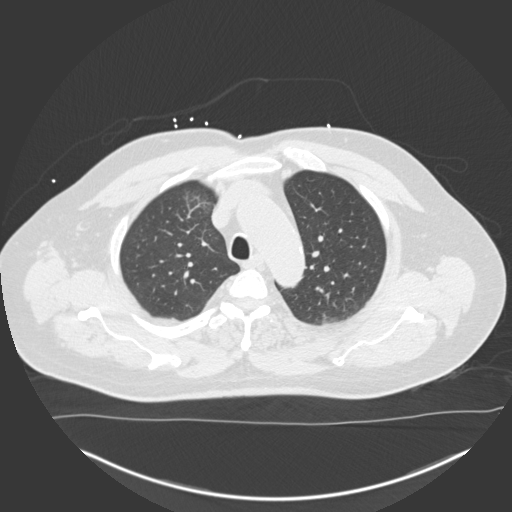
[im 131/153  lung]
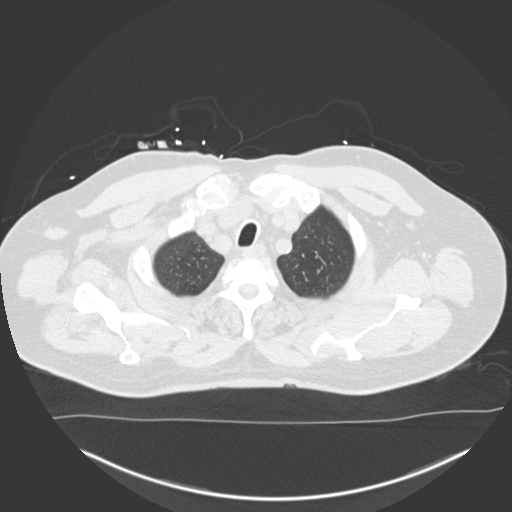
[im 145/153  lung]
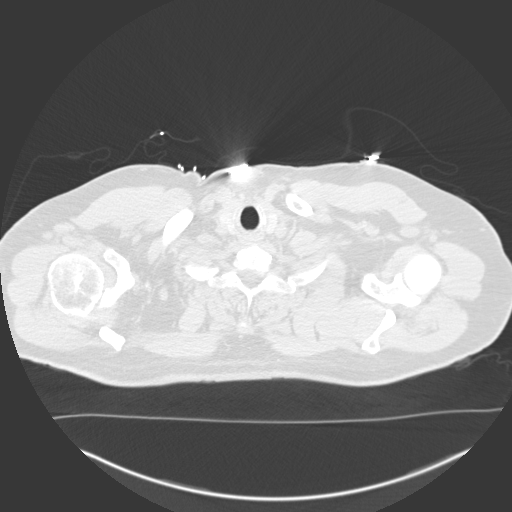

[Series 10: coronal · coronal · 0.61mm/px · 3 of 151 slices shown]
[im 31/151  lung]
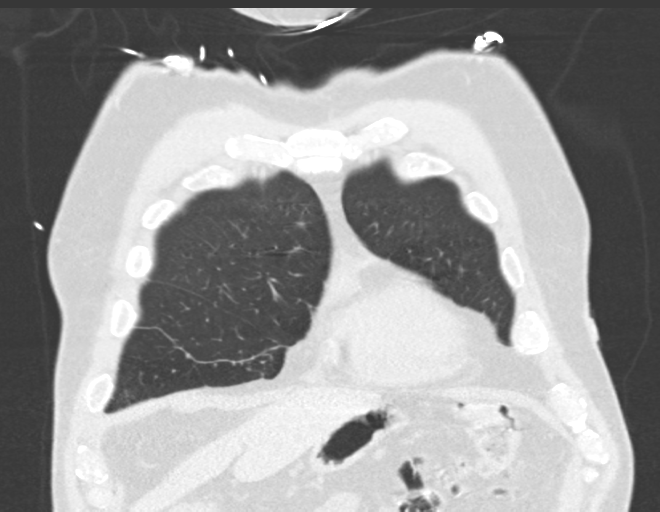
[im 61/151  lung]
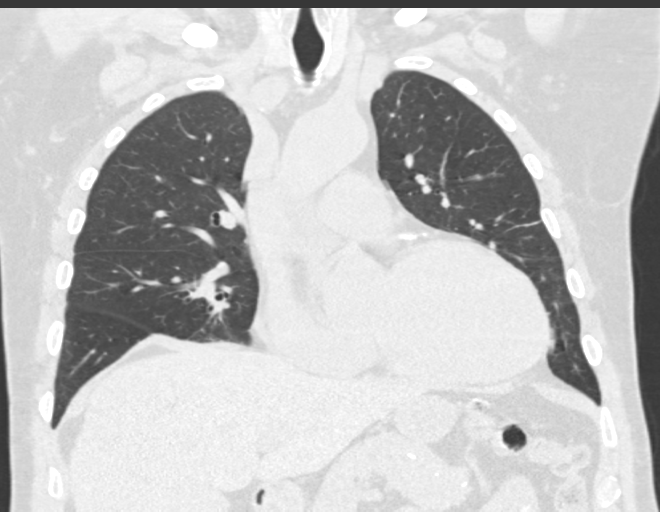
[im 91/151  lung]
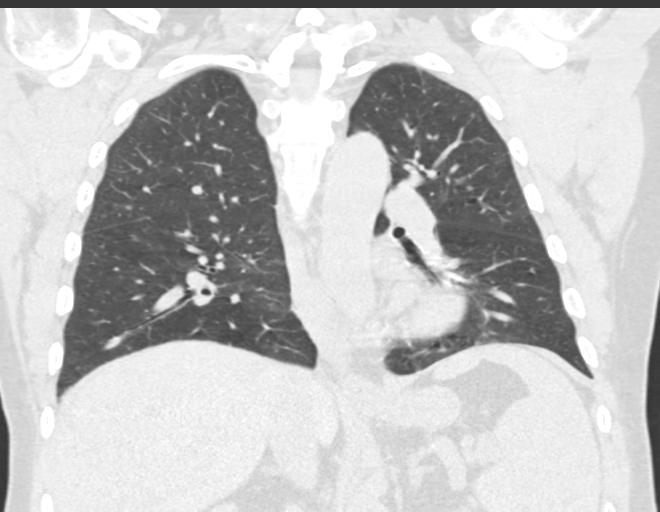

[15 of 36 positions shown; findings below may reference images not displayed]

FINDINGS: Cardiovascular: Aortic atherosclerosis. Unchanged enlargement of the
tubular ascending thoracic aorta, measuring up to 4.4 x 4.4 cm.
Cardiomegaly. Left and right coronary artery calcifications. No
pericardial effusion.

Mediastinum/Nodes: No enlarged mediastinal, hilar, or axillary lymph
nodes. Thyroid gland, trachea, and esophagus demonstrate no
significant findings.

Lungs/Pleura: Bland appearing, bandlike scarring and or partial
atelectasis of the dependent lungs, unchanged compared to prior
examination. No significant air trapping on expiratory phase imaging
no pleural effusion or pneumothorax.

Upper Abdomen: No acute abnormality.

Musculoskeletal: No chest wall mass or suspicious bone lesions
identified.
IMPRESSION: 1. Bland appearing, bandlike scarring and or partial atelectasis of
the dependent lungs, unchanged compared to prior examination. No
evidence of fibrotic interstitial lung disease. No significant air
trapping on expiratory phase imaging.
2. Cardiomegaly and coronary artery disease.
3. Unchanged enlargement of the tubular ascending thoracic aorta,
measuring up to 4.4 x 4.4 cm. Recommend annual imaging followup by
CTA or MRA if not otherwise imaged. This recommendation follows [U8]
ACCF/AHA/AATS/ACR/ASA/SCA/EMMANUELLA/EMMANUELLA/EMMANUELLA/EMMANUELLA Guidelines for the
Diagnosis and Management of Patients with Thoracic Aortic Disease.
Circulation. [U8]; 121: E266-e369. Aortic aneurysm NOS ([U8]-[U8])

Aortic Atherosclerosis ([U8]-[U8]).

## 2021-06-07 MED ORDER — NEBIVOLOL HCL 5 MG PO TABS
5.0000 mg | ORAL_TABLET | Freq: Two times a day (BID) | ORAL | Status: DC
  Administered 2021-06-07 – 2021-06-08 (×3): 5 mg via ORAL
  Filled 2021-06-07 (×4): qty 1

## 2021-06-07 MED ORDER — PREDNISONE 10 MG PO TABS
10.0000 mg | ORAL_TABLET | Freq: Every day | ORAL | Status: DC
Start: 2021-06-07 — End: 2021-06-08
  Administered 2021-06-07 – 2021-06-08 (×2): 10 mg via ORAL
  Filled 2021-06-07 (×3): qty 1

## 2021-06-07 MED ORDER — FOLIC ACID 1 MG PO TABS
1.0000 mg | ORAL_TABLET | Freq: Every day | ORAL | Status: DC
  Administered 2021-06-07 – 2021-06-08 (×2): 1 mg via ORAL
  Filled 2021-06-07 (×2): qty 1

## 2021-06-07 MED ORDER — GUAIFENESIN-DM 100-10 MG/5ML PO SYRP
5.0000 mL | ORAL_SOLUTION | ORAL | Status: DC | PRN
  Administered 2021-06-07 – 2021-06-08 (×3): 5 mL via ORAL
  Filled 2021-06-07 (×3): qty 5

## 2021-06-07 MED ORDER — ALBUTEROL SULFATE (2.5 MG/3ML) 0.083% IN NEBU: 2.5000 mg | INHALATION_SOLUTION | RESPIRATORY_TRACT | Status: DC | PRN

## 2021-06-07 MED ORDER — ACETAMINOPHEN 325 MG PO TABS
650.0000 mg | ORAL_TABLET | Freq: Four times a day (QID) | ORAL | Status: DC | PRN
  Administered 2021-06-07: 650 mg via ORAL
  Filled 2021-06-07: qty 2

## 2021-06-07 MED ORDER — GABAPENTIN 300 MG PO CAPS
600.0000 mg | ORAL_CAPSULE | Freq: Three times a day (TID) | ORAL | Status: DC
  Administered 2021-06-07 – 2021-06-08 (×4): 600 mg via ORAL
  Filled 2021-06-07 (×4): qty 2

## 2021-06-07 MED ORDER — DILTIAZEM HCL ER COATED BEADS 180 MG PO CP24
180.0000 mg | ORAL_CAPSULE | Freq: Every day | ORAL | Status: DC
  Administered 2021-06-07 – 2021-06-08 (×2): 180 mg via ORAL
  Filled 2021-06-07 (×2): qty 1

## 2021-06-07 MED ORDER — MONTELUKAST SODIUM 10 MG PO TABS
10.0000 mg | ORAL_TABLET | Freq: Every day | ORAL | Status: DC
  Administered 2021-06-07: 10 mg via ORAL
  Filled 2021-06-07: qty 1

## 2021-06-07 MED ORDER — ATORVASTATIN CALCIUM 40 MG PO TABS
40.0000 mg | ORAL_TABLET | Freq: Every day | ORAL | Status: DC
  Administered 2021-06-07 – 2021-06-08 (×2): 40 mg via ORAL
  Filled 2021-06-07 (×2): qty 1

## 2021-06-07 MED ORDER — HYDRALAZINE HCL 50 MG PO TABS
100.0000 mg | ORAL_TABLET | Freq: Three times a day (TID) | ORAL | Status: DC
  Administered 2021-06-07 – 2021-06-08 (×4): 100 mg via ORAL
  Filled 2021-06-07 (×3): qty 2

## 2021-06-07 MED ORDER — SPIRONOLACTONE 12.5 MG HALF TABLET
12.5000 mg | ORAL_TABLET | Freq: Every day | ORAL | Status: DC
  Administered 2021-06-07 – 2021-06-08 (×2): 12.5 mg via ORAL
  Filled 2021-06-07 (×2): qty 1

## 2021-06-07 MED ORDER — CYANOCOBALAMIN 1000 MCG/ML IJ SOLN
1000.0000 ug | INTRAMUSCULAR | Status: DC
  Administered 2021-06-07: 1000 ug via INTRAMUSCULAR
  Filled 2021-06-07: qty 1

## 2021-06-07 MED ORDER — POTASSIUM CHLORIDE CRYS ER 20 MEQ PO TBCR
20.0000 meq | EXTENDED_RELEASE_TABLET | Freq: Two times a day (BID) | ORAL | Status: DC
  Administered 2021-06-07 – 2021-06-08 (×3): 20 meq via ORAL
  Filled 2021-06-07 (×3): qty 1

## 2021-06-07 MED ORDER — APIXABAN 5 MG PO TABS
5.0000 mg | ORAL_TABLET | Freq: Two times a day (BID) | ORAL | Status: DC
  Administered 2021-06-07 – 2021-06-08 (×3): 5 mg via ORAL
  Filled 2021-06-07 (×3): qty 1

## 2021-06-07 MED ORDER — FLUTICASONE PROPIONATE 50 MCG/ACT NA SUSP
1.0000 | Freq: Every day | NASAL | Status: DC
  Administered 2021-06-07 – 2021-06-08 (×2): 1 via NASAL
  Filled 2021-06-07: qty 16

## 2021-06-07 MED ORDER — FLUTICASONE FUROATE-VILANTEROL 200-25 MCG/INH IN AEPB
1.0000 | INHALATION_SPRAY | Freq: Every day | RESPIRATORY_TRACT | Status: DC
  Administered 2021-06-07 – 2021-06-08 (×2): 1 via RESPIRATORY_TRACT
  Filled 2021-06-07: qty 28

## 2021-06-07 MED ORDER — LOSARTAN POTASSIUM 50 MG PO TABS
100.0000 mg | ORAL_TABLET | Freq: Every day | ORAL | Status: DC
  Administered 2021-06-07 – 2021-06-08 (×2): 100 mg via ORAL
  Filled 2021-06-07 (×2): qty 2

## 2021-06-07 MED ORDER — VITAMIN B-12 1000 MCG PO TABS: 1000.0000 ug | ORAL_TABLET | ORAL | Status: DC

## 2021-06-07 NOTE — Progress Notes (Signed)
FPTS Interim Progress Note  S:Patient feels that he is more short of breath this admission than his last one. He wants to know what is making him short of breath because there wasn't an answer last time.   O: BP (!) 148/100 (BP Location: Right Arm)   Pulse (!) 50   Temp 97.7 F (36.5 C) (Oral)   Resp 18   Ht 5\' 11"  (1.803 m)   Wt 112.9 kg   SpO2 97%   BMI 34.73 kg/m   General: NAD, well-appearing, sitting up in bed eating breakfast CV: RRR, no murmur appreciated Pulmonary: Patient comfortably on 2 L, no increased work of breathing, able to speak in full sentences  A/P: Hypoxia with dyspnea in the setting of history of PE Patient continues to be stable on 2 L O2 with no increased work of breathing.  Patient subjectively says that he still is having increasing shortness of breath.  Pulmonology was consulted and will see the patient. - Continuous pulse ox monitoring - Continue treatment per H&P - Pulmonology consulted, appreciate recs - Continue supplemental O2 as required to maintain saturations >92%  Ryan Lardizabal, DO 06/07/2021, 8:30 AM PGY-1, [facility] pager [phone]

## 2021-06-07 NOTE — Progress Notes (Addendum)
Physical Therapy Treatment & Discharge Patient Details Name: Ryan Glass MRN: [id number] DOB: 12/01/1956 Today's Date: 06/07/2021    History of Present Illness Pt is a 65 y.o. male admitted 06/06/21 with dyspnea and cough. CTA negative for acute PE. Of note, recent admission 2 wks prior for PE. 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   PT Comments    Pt seen for additional session with request for stair training. Pt independent with ambulation; pt tolerated stair training well, able to ascend/descend flight of stairs, stability improved with rail support. Pt's main limiting factor continues to be DOE; verbal cues for activity pacing, pursed lip breathing. Reviewed educ re: activity recommendations, energy conservation strategies, importance of mobility. Pt's wife present and supportive. Pt has met short-term acute PT goals, reports no further questions or concerns; encouraged more frequent OOB activity. Will d/c acute PT.  SpO2 >/94% on RA with activity   Follow Up Recommendations  No PT follow up (cardiopulmonary rehab)     Equipment Recommendations  None recommended by PT    Recommendations for Other Services       Precautions / Restrictions Precautions Precautions: Other (comment) Precaution Comments: DOE Restrictions Weight Bearing Restrictions: No    Mobility  Bed Mobility Overal bed mobility: Independent                  Transfers Overall transfer level: Independent Equipment used: None Transfers: Sit to/from Stand              Ambulation/Gait Ambulation/Gait assistance: Independent Social research officer, government (Feet): 450 Feet Assistive device: None Gait Pattern/deviations: WFL(Within Functional Limits)         Stairs Stairs: Yes Stairs assistance: Supervision Stair Management: No rails;One rail Left;Alternating pattern;Step to pattern Number of Stairs: 12 General stair comments:  Ascending steps alternating pattern without UE support, supervision for safety; descend with single rail support, step-to pattern, stability improved. DOE 3/4 requiring cues for activity pacing and pursed lip breathing   Wheelchair Mobility    Modified Rankin (Stroke Patients Only)       Balance Overall balance assessment: No apparent balance deficits (not formally assessed)                                          Cognition Arousal/Alertness: Awake/alert Behavior During Therapy: WFL for tasks assessed/performed Overall Cognitive Status: Within Functional Limits for tasks assessed                                        Exercises      General Comments General comments (skin integrity, edema, etc.): Wife present and supportive; educ both on activity recommendations, current SpO2      Pertinent Vitals/Pain Pain Assessment: No/denies pain Pain Intervention(s): Monitored during session    Home Living                      Prior Function            PT Goals (current goals can now be found in the care plan section) Progress towards PT goals: Goals met/education completed, patient discharged from PT    Frequency    Min 3X/week      PT Plan Current plan remains  appropriate    Co-evaluation              AM-PAC PT "6 Clicks" Mobility   Outcome Measure  Help needed turning from your back to your side while in a flat bed without using bedrails?: None Help needed moving from lying on your back to sitting on the side of a flat bed without using bedrails?: None Help needed moving to and from a bed to a chair (including a wheelchair)?: None Help needed standing up from a chair using your arms (e.g., wheelchair or bedside chair)?: None Help needed to walk in hospital room?: None Help needed climbing 3-5 steps with a railing? : A Little 6 Click Score: 23    End of Session   Activity Tolerance: Patient tolerated treatment  well Patient left: in bed;with call bell/phone within reach;with nursing/sitter in room;with family/visitor present Nurse Communication: Mobility status PT Visit Diagnosis: Other abnormalities of gait and mobility (R26.89);Difficulty in walking, not elsewhere classified (R26.2)     Time: 9924-2683 PT Time Calculation (min) (ACUTE ONLY): 17 min  Charges:  $Gait Training: 8-22 mins                     Mabeline Caras, PT, DPT Acute Rehabilitation Services  Pager [phone] Office [place] 06/07/2021, 2:28 PM

## 2021-06-07 NOTE — Evaluation (Signed)
Physical Therapy Evaluation Patient Details Name: Ryan Glass MRN: [id number] DOB: 1956-10-03 Today's Date: 06/07/2021   History of Present Illness  Pt is a 65 y.o. male admitted 06/06/21 with dyspnea and cough. CTA negative for acute PE. Of note, recent admission 2 wks prior for PE. 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   Clinical Impression  Pt presents with an overall decrease in functional mobility secondary to above. PTA, pt independent, but limited by DOE. Today, pt independent with activity, requiring educ re: activity pacing, pursed lip breathing, energy conservation. Pt requesting additional session for stair training, as this is difficult with significant SOB at home. Pt would benefit from continued acute PT services to maximize functional mobility and independence prior to d/c home.    SpO2 95% on RA   Follow Up Recommendations No PT follow up (cardiopulmonary rehab)    Equipment Recommendations  None recommended by PT    Recommendations for Other Services       Precautions / Restrictions Precautions Precautions: Other (comment) Precaution Comments: DOE Restrictions Weight Bearing Restrictions: No      Mobility  Bed Mobility Overal bed mobility: Independent                  Transfers Overall transfer level: Independent Equipment used: None Transfers: Sit to/from Stand Sit to Stand: Modified independent (Device/Increase time) Stand pivot transfers: Modified independent (Device/Increase time);Supervision (secondary to SOB)          Ambulation/Gait Ambulation/Gait assistance: Independent Gait Distance (Feet): 350 Feet Assistive device: None Gait Pattern/deviations: Step-through pattern;Decreased stride length Gait velocity: Decreased   General Gait Details: Slow, steady ambulation without DME; intermittent standing rest breaks secondary to SOB, cues for activity pacing and  pursed lip breathing. SpO2 95% on RA  Stairs Stairs:  (pt declined, but would like to perform next session)          Wheelchair Mobility    Modified Rankin (Stroke Patients Only)       Balance Overall balance assessment: No apparent balance deficits (not formally assessed)                                           Pertinent Vitals/Pain Pain Assessment: No/denies pain Pain Intervention(s): Monitored during session    Home Living Family/patient expects to be discharged to:: Private residence Living Arrangements: Spouse/significant other Available Help at Discharge: Family;Available 24 hours/day Type of Home: House Home Access: Stairs to enter Entrance Stairs-Rails: Can reach both Entrance Stairs-Number of Steps: 5-6 Home Layout: Multi-level Home Equipment: Walker - 2 wheels;Cane - single point      Prior Function Level of Independence: Independent         Comments: Independent without DME, but has been limited by DOE     Hand Dominance   Dominant Hand: Right    Extremity/Trunk Assessment   Upper Extremity Assessment Upper Extremity Assessment: Overall WFL for tasks assessed    Lower Extremity Assessment Lower Extremity Assessment: Overall WFL for tasks assessed       Communication   Communication: No difficulties  Cognition Arousal/Alertness: Awake/alert Behavior During Therapy: WFL for tasks assessed/performed Overall Cognitive Status: Within Functional Limits for tasks assessed  General Comments General comments (skin integrity, edema, etc.): Pt reports owning pulse ox and checking SpO2 at home; main limiting factor is SOB with activity. Educ re: energy conservation, activity pacing, pursed lip breathing, activity recommendations    Exercises     Assessment/Plan    PT Assessment Patient needs continued PT services  PT Problem List Decreased activity tolerance;Decreased  mobility       PT Treatment Interventions Gait training;Stair training;Functional mobility training;Therapeutic activities;Therapeutic exercise;Patient/family education    PT Goals (Current goals can be found in the Care Plan section)  Acute Rehab PT Goals Patient Stated Goal: Improved breathing with activity PT Goal Formulation: With patient Time For Goal Achievement: 06/21/21 Potential to Achieve Goals: Good    Frequency Min 3X/week   Barriers to discharge        Co-evaluation               AM-PAC PT "6 Clicks" Mobility  Outcome Measure Help needed turning from your back to your side while in a flat bed without using bedrails?: None Help needed moving from lying on your back to sitting on the side of a flat bed without using bedrails?: None Help needed moving to and from a bed to a chair (including a wheelchair)?: None Help needed standing up from a chair using your arms (e.g., wheelchair or bedside chair)?: None Help needed to walk in hospital room?: None Help needed climbing 3-5 steps with a railing? : A Little 6 Click Score: 23    End of Session   Activity Tolerance: Patient tolerated treatment well Patient left: in chair;with call bell/phone within reach Nurse Communication: Mobility status PT Visit Diagnosis: Other abnormalities of gait and mobility (R26.89);Difficulty in walking, not elsewhere classified (R26.2)    Time: 9604-5409 PT Time Calculation (min) (ACUTE ONLY): 17 min   Charges:   PT Evaluation $PT Eval Low Complexity: 1 Low        Ina Homes, PT, DPT Acute Rehabilitation Services  Pager [phone] Office [phone]  Malachy Chamber 06/07/2021, 10:26 AM

## 2021-06-07 NOTE — H&P (Addendum)
Family Medicine [facility] Admission History and Physical Service Pager: [phone]  Patient name: Ryan Glass Medical record number: [id number] Date of birth: 04-28-1956 Age: 65 y.o. Gender: male  Primary Care Provider: [name], MD Consultants: None Code Status: Full Code Preferred Emergency Contact: Wife, Rush Salce, [phone]  Chief Complaint: SOB  Assessment and Plan: [name] is a 65 y.o. male presenting with dyspnea. PMH is significant for PVCs s/p PVC ablation (2019), dilated aortic root, HTN, HLD, lower extremity edema,COVID 19 infection, colon polyps s/p polypectomy (2018), OSA, and Hx of PE.  Acute Hypoxia  Dyspnea Patient has continued to have dyspnea and cough since previous hospital discharge.  No O2 requirement previously.  Yesterday, patient reports that he desatted to 76% at home and in ED to 80's. Patient has had exertional dyspnea since August 2021 following COVID-19 infection.  Worsened around 1 month ago.  Currently breathing comfortably on 2L .  Lungs CTAB with no wheezes or crackles. Trops negative x2. TSH and Lipase normal. BNP WNL. COVID negative. Hospitalized for distal subsegmental PE 2 weeks ago.  Currently on Eliquis.  Repeat CTA in ED shows no repeat PE. Seen by Cardiology last admission & had RHC & LHC on 05/29/21 that showed mild non-obstructive CAD.  Underwent extensive diuresis given significant peripheral edema.  Dyspnea previously thought by Cardiology service not to be Cardiac in origin.  Seen by Pulmonology during last admission.  Discharged with plan for outpatient follow-up and PFT's given no hypoxia at time of D/C.  Continue to monitor O2 requirement.  Will consult pulm for further evaluation/work-up - Admit to FPTS, Attending Dr McDiarmid - VS per unit - consider consult Pulmonology & cardiology  - Cardiac Monitoring - Wean O2 as tolerated, sats >92% - Continuous Pulse Ox - Continue home Singulair, Breo Elipta,  and Albuterol nebulizer q4hr PRN - PT/OT eval and treat  Hx of Pulmonary Embolism CTA completed in ED with negative results for PE.  Recent hospitalization for PE and LHC.  Received Heparin drip while hospitalized 2 weeks ago.  On Eliquis 10 mg BID day 7/7 with plan to switch to Eliquis 5 mg BID.  Not believed to be cause of recent dyspnea. - Continue Eliquis 5 mg BID   PVCs s/p PVC ablation in 2019 PVCs reoccurred and they are from different focus than preablation. Holter monitor showed PVC load at 8% EP recommended continuing beta-blockers.  Repeat 3-day Zio patch showed 10% PVC load in March 2022 and amlodipine was changed to Cardizem.  Patient follows closely with Cardiology outpatient and was seen by cardiology during last hospitalization. PVC's improved with diuresis and K repletion.  PVC's now frequent again on admission   Home meds: Cardizem 180 mg Qday, Bystolic 10 mg Q day.  Can consider Cardiology consult if persistent. - Continuous Cardiac Monitoring - Monitor for PVC's - Continue Cardizem and Bistolic   HTN  Stable Aortic Aneurysm SBP 140's-150's. Want Tight BP control for Aortic Aneurysm of 4.4 cm.  Has been stable for several years.  Home meds: Bystolic 10 mg Q day, Hydral 100 mg TID, Chlorthalidone 25 mg Q day, Cardizem 180 mg Q day, Losartan 100 mg Q day, and Spironolactone 12.5 mg daily.   - continue losartan & chlorthalidone  - Continue Bystolic, Hydral, Cardizem, and Spironolactone   Hx of Hypokalemia K- 4.2, Mg- 2.1.  Currently on Spironolactone and K tablet 20 mg BID. - Continue Spironolactone - Continue Oral K 20 mEq BID - Daily  BMP   AKI on CKD stage 3a Cr slightly elevated at 1.65.  Baseline around 1.2-1.3. Improved on repeat to 1.26.  - Daily BMP - Avoid nephrotoxic medications   HFmrEF Last ECHO on 05/29/21 showed EF 45 to 50%. The left ventricle has mildly decreased function. The left ventricle has no regional wall motion abnormalities. There is severe  asymmetric left ventricular hypertrophy. Left ventricular diastolic parameters are consistent with Grade I diastolic dysfunction.  Right ventricular systolic function is moderately reduced. The right ventricular size is moderately enlarged. There is moderately elevated pulmonary artery systolic pressure. Does not appear fluid overloaded. - Continue Bystolic, Hydral, Cardizem, and Spironolactone, losartan and chlorthalidone    HLD LDL goal <70 vascular disease for this patient.  05/27/21 shows LDL 61.  Meds: Atorvastatin 40 mg Q day. --Continue home Atorvastatin   RA & Chronic gout of L foot Home meds: Colchicine 0.6 mg, Uloric 40 mg, methotrexate 15 mg once a week, prednisone 10 mg, folic acid, Tylenol, Allopurinol  Q day - Continue Home steroid 10 mg - Hold rest of home medications   OSA on CPAP - CPAP qHS  FEN/GI: Heart healthy diet Prophylaxis: Eliquis  Disposition: Med-Tele  History of Present Illness:  Ryan Glass is a 65 y.o. male presenting with SOB.   Patient presented to med center [place] with difficulty breathing. He reports no leg swelling. He continues to have a cough. He reports feeling SOB with any exertion and used home pulse oximetry that showed low values as low as 76% while ambulating yesterday. He has been trying to stay as active as possible. He reports that he has had low energy and low desire to do much around the house.   He reports he has been able to take Eliquis as prescribed. He has also been taking K twice daily.   Patient denies smoking history. He reports occasional alcohol consumption.   Review Of Systems: Per HPI with the following additions:    Review of Systems Constitutional:  Negative for fever. HENT:  Positive for rhinorrhea. Negative for sore throat.   Eyes:  Negative for visual disturbance. Respiratory:  Positive for cough and shortness of breath.   Cardiovascular:  Positive for palpitations. Negative for chest pain and leg  swelling. Gastrointestinal:  Negative for constipation, diarrhea, nausea and vomiting. Genitourinary:  Negative for difficulty urinating, dysuria and frequency. Skin:  Negative for rash. Neurological:  Positive for weakness and light-headedness. Negative for syncope. Psychiatric/Behavioral:  Positive for confusion.       Patient Active Problem List   Diagnosis Date Noted   Hypoxia 06/06/2021   CAD (coronary artery disease), non-obstructive 05/30/2021   Elevated troponin    Precordial chest pain    Heart failure with mid-range ejection fraction (HCC) 05/27/2021   LVH (left ventricular hypertrophy) due to hypertensive disease, with heart failure (HCC) 05/27/2021   Right ventricular dysfunction 05/27/2021   Pulmonary artery hypertension (HCC) 05/27/2021   Class 2 severe obesity with serious comorbidity and body mass index (BMI) of 35.0 to 35.9 in adult [facility]) 05/26/2021   Pulmonary embolism (HCC) 05/25/2021   Dyspnea on exertion    Thoracic aortic aneurysm without rupture (HCC) 02/01/2021   Ascending aortic aneurysm (HCC) 02/01/2021   Other hyperlipidemia 10/14/2020   Testicular swelling 05/14/2019   Pain in right knee 01/29/2019   PVC's (premature ventricular contractions)    PVC (premature ventricular contraction) 10/07/2018   Dilated aortic root (HCC)    Allergic rhinitis 01/10/2016   Chronic gout  without tophus 01/10/2016   Drug therapy 01/10/2016   OSA (obstructive sleep apnea) 01/10/2016   Colonic polyp 12/14/2015   Rt groin pain 12/14/2015   Scoliosis of thoracolumbar spine 12/14/2015   Right lower quadrant abdominal pain 04/20/2015   Arthritis of knee 02/22/2015   Edema of extremities 02/22/2015   Essential hypertension 02/22/2015   Preventative health care 02/22/2015   Sleep apnea 04/27/2014   Bradycardia by electrocardiogram 04/16/2014   Chest pain 04/16/2014    Past Medical History: Past Medical History:  Diagnosis Date   Ascending aortic aneurysm (HCC)    a.  CT 05/2021 showed 4.4x4.4cm TAA.   Bilateral lower extremity edema    Chronic gout without tophus    followed by dr Sharmon Revere    (06-08-2020  per pt last episode right knee 3 wks ago)   CKD (chronic kidney disease), stage II    nephrology--- [facility] (10-29-2019 note in epic scanned in  media)   Heart failure with mid-range ejection fraction [facility]) 05/27/2021   05/26/21: Left ventricular ejection fraction, by estimation, is 45 to 50%. There is severe asymmetric left ventricular hypertrophy. G1DD present.    Hypertension    followed by cardiology, dr t. turner   (05-14-2018 nuclear study in epic , normal perfusion with nuclear ef 61%)   Left hydrocele    Low serum potassium level 04/27/2014   OSA on CPAP    per pt uses every night   Palpitations followed by dr t. Mayford Knife   06-08-2020  still feels palipations due to PVCs when exertion but not with chest pain/ discomfort   Pneumonia due to COVID-19 virus    PVC's (premature ventricular contractions) cardiologist--- dr t. turner   Status post PVC ablation by Dr. Ladona Ridgel 2019 with recurrence of frequent PVCs/bigeminy;  prior pseudobradycardia r/t pvcs   Rheumatoid arthritis involving multiple sites [facility])    rheumotology--- dr a. Sharmon Revere  ([facility] in [place])    Past Surgical History: Past Surgical History:  Procedure Laterality Date   HYDROCELE EXCISION Left 06/14/2020   Procedure: LEFT  HYDROCELECTOMY ADULT;  Surgeon: Noel Christmas, MD;  Location: [facility];  Service: Urology;  Laterality: Left;   INCISIONAL HERNIA REPAIR  02-23-2016      LAPAROSCOPIC   LAPAROSCOPIC INGUINAL HERNIA REPAIR Bilateral 08-22-2015     AND UMBILICAL HERNIA REPAIR   PVC ABLATION N/A 10/07/2018   Procedure: PVC ABLATION;  Surgeon: Marinus Maw, MD;  Location: [facility] INVASIVE CV LAB;  Service: Cardiovascular;  Laterality: N/A;   RIGHT/LEFT HEART CATH AND CORONARY ANGIOGRAPHY N/A 05/29/2021   Procedure: RIGHT/LEFT HEART CATH AND  CORONARY ANGIOGRAPHY;  Surgeon: Kathleene Hazel, MD;  Location: [facility] INVASIVE CV LAB;  Service: Cardiovascular;  Laterality: N/A;   UMBILICAL HERNIA REPAIR  child    Social History: Social History   Tobacco Use   Smoking status: Never   Smokeless tobacco: Never  Vaping Use   Vaping Use: Never used  Substance Use Topics   Alcohol use: Yes    Alcohol/week: 5.0 - 7.0 standard drinks    Types: 5 - 7 Cans of beer per week   Drug use: Never   Additional social history:  Please also refer to relevant sections of EMR.  Family History: Family History  Problem Relation Age of Onset   Cardiomyopathy Mother    Heart attack Father     Allergies and Medications: Allergies  Allergen Reactions   Amlodipine Besylate Swelling    Leg swelling  with 10mg  daily am dosing; decreased with BID 5mg  dosing   Aspirin Itching   No current facility-administered medications on file prior to encounter.   Current Outpatient Medications on File Prior to Encounter  Medication Sig Dispense Refill   acetaminophen-codeine (TYLENOL #3) 300-30 MG tablet Take 1-2 tablets by mouth as needed for moderate pain.     albuterol (VENTOLIN HFA) 108 (90 Base) MCG/ACT inhaler Inhale 2 puffs into the lungs every 4 (four) hours as needed for wheezing or shortness of breath. 8 g 2   apixaban (ELIQUIS) 5 MG TABS tablet Take 2 tablets (10 mg total) by mouth 2 (two) times daily. 14 tablet 0   apixaban (ELIQUIS) 5 MG TABS tablet Take 1 tablet (5 mg total) by mouth 2 (two) times daily. 60 tablet 0   atorvastatin (LIPITOR) 40 MG tablet Take 1 tablet (40 mg total) by mouth daily. 90 tablet 3   chlorthalidone (HYGROTON) 25 MG tablet Take 25 mg by mouth daily.     colchicine 0.6 MG tablet Take 1-2 tablets by mouth daily as needed (gout).      diclofenac (VOLTAREN) 75 MG EC tablet Take 75 mg by mouth 2 (two) times daily.     diltiazem (CARDIZEM CD) 180 MG 24 hr capsule Take 1 capsule (180 mg total) by mouth daily. 90 capsule 3    fluticasone (FLONASE) 50 MCG/ACT nasal spray SPRAY ONE SPRAY IN EACH NOSTRIL TWICE DAILY 16 g 4   Fluticasone-Salmeterol (ADVAIR) 250-50 MCG/DOSE AEPB Inhale 1 puff into the lungs every 12 (twelve) hours. 60 each 11   folic acid (FOLVITE) 1 MG tablet Take 1 mg by mouth daily.     gabapentin (NEURONTIN) 600 MG tablet Take 600 mg by mouth 3 (three) times daily.      HUMIRA PEN 40 MG/0.4ML PNKT Inject 40 mg as directed every 14 (fourteen) days.     hydrALAZINE (APRESOLINE) 100 MG tablet TAKE ONE TABLET BY MOUTH THREE TIMES A DAY (Patient taking differently: Take 100 mg by mouth 3 (three) times daily.) 270 tablet 2   losartan (COZAAR) 100 MG tablet Take 1 tablet (100 mg total) by mouth daily. Please keep upcoming appt in January 2022 with Dr. Mayford Knife before anymore refills. Thank you 30 tablet 0   methotrexate 2.5 MG tablet Take 15 mg by mouth every Friday.     montelukast (SINGULAIR) 10 MG tablet Take 1 tablet (10 mg total) by mouth at bedtime. 90 tablet 0   nebivolol (BYSTOLIC) 5 MG tablet TAKE TWO TABLETS BY MOUTH EVERY NIGHT AT BEDTIME 180 tablet 3   Potassium Chloride ER 20 MEQ TBCR Take 1 tablet in the morning and 1 at night 60 tablet    potassium chloride SA (KLOR-CON M20) 20 MEQ tablet Take 1 tablet (20 mEq total) by mouth 2 (two) times daily.     predniSONE (DELTASONE) 5 MG tablet Take 10 mg by mouth daily.      sildenafil (REVATIO) 20 MG tablet Take 20 mg by mouth daily as needed (erectile dysfunction).     spironolactone (ALDACTONE) 25 MG tablet Take 0.5 tablets (12.5 mg total) by mouth daily. 30 tablet 0    Objective: BP (!) 147/98 (BP Location: Left Arm)   Pulse 71   Temp 98 F (36.7 C) (Oral)   Resp 19   Ht 5\' 11"  (1.803 m)   Wt 112.9 kg   SpO2 95%   BMI 34.73 kg/m  Exam:  Physical Exam Constitutional:  General: He is not in acute distress.    Appearance: He is not ill-appearing.  HENT:     Head: Normocephalic.     Mouth/Throat:     Mouth: Mucous membranes are  moist.  Cardiovascular:     Rate and Rhythm: Normal rate and regular rhythm.     Pulses: Normal pulses.  Pulmonary:     Effort: Pulmonary effort is normal.     Breath sounds: Normal breath sounds. No decreased breath sounds, wheezing, rhonchi or rales.  Abdominal:     General: Bowel sounds are normal.     Palpations: Abdomen is soft.     Tenderness: There is no abdominal tenderness.  Musculoskeletal:     Comments: Mild non-pitting edema bilaterally  Skin:    General: Skin is warm.  Neurological:     General: No focal deficit present.     Mental Status: He is alert.  Psychiatric:        Mood and Affect: Mood normal.        Behavior: Behavior normal.     Labs and Imaging: CBC BMET  Recent Labs  Lab 06/06/21 1703  WBC 6.8  HGB 12.5*  HCT 37.1*  PLT 270    Recent Labs  Lab 06/06/21 1703  NA 137  K 4.3  CL 108  CO2 20*  BUN 27*  CREATININE 1.65*  GLUCOSE 129*  CALCIUM 9.5      EKG: NS, frequent PVC's  Jovita Kussmaul, MD 06/07/2021, 3:46 AM PGY-1, [place] [facility] FPTS Intern pager: [phone], text pages welcome    FPTS Upper-Level Resident Addendum   I have independently interviewed and examined the patient. I have discussed the above with the Dr. Pecola Leisure and agree with the documentation. My edits for correction/addition/clarification are included above. Please see any attending notes.   Ronnald Ramp, MD PGY-2, [place] [facility] 06/07/2021 6:21 AM  FPTS Service pager: [phone] (text pages welcome through [facility])

## 2021-06-07 NOTE — Plan of Care (Signed)
  Problem: Health Behavior/Discharge Planning: Goal: Ability to manage health-related needs will improve Outcome: Progressing   

## 2021-06-07 NOTE — Hospital Course (Addendum)
Ryan Glass is a 65 y.o. male presenting with dyspnea. PMH is significant for PVCs s/p PVC ablation (2019), dilated aortic root, HTN, HLD, lower extremity edema,COVID 19 infection, colon polyps s/p polypectomy (2018), OSA, and Hx of PE.  Acute on chronic respiratory failure Patient initially admitted with increased work of breathing and home O2 measurements in the 70s with exertion.  In the ED, patient was found to have an O2 saturation of 87%.  CTA was completed twice in the ED as the first one completed could not rule out PE and a repeat CT was completed which showed no PE. Pulmonology was consulted and patient underwent high-resolution CT which showed no interstitial lung disease and no change from prior imaging.  Patient was monitored on pulse ox during activity and was able to maintain his saturations >94%.  Unsure what exactly caused the transient hypoxia, possible relation to fluid retention though BNP low and LV function was improved.  Patient advised to keep the weight record and O2 saturation monitoring and follow-up in office.    All other chronic conditions were stable during hospitalization at home medications continued as appropriate.  See H&P for further information.   Issues for follow-up: Follow-up anxiety and consider treatment outpatient Patient to follow-up with pulmonology on 7/7, may need a CPET if persistent. Patient to keep a weight record and monitor O2 saturations Unchanged enlargement of the tubular ascending thoracic aorta, measuring up to 4.4 x 4.4 cm. Recommend annual imaging followup by CTA or MRA if not otherwise imaged 5.  Recheck potassium on BMP at follow up

## 2021-06-07 NOTE — Consult Note (Signed)
Name: [place] MRN: [id number] DOB: Nov 18, 1956    ADMISSION DATE:  06/06/2021 CONSULTATION DATE:  06/07/2021   REFERRING MD :  Sheryle Hail  CHIEF COMPLAINT: Dyspnea on exertion, hypoxia  BRIEF PATIENT DESCRIPTION: 65 year old admitted with dyspnea on exertion and hypoxia He has a history of recent subsegmental PE 05/2021, maintained on Eliquis, COVID infection in 07/2020  SIGNIFICANT EVENTS  05/2021 hospitalization pulmonary consult >> dyspnea out of proportion and predates subsegmental acute PE  STUDIES:  LHC 05/29/2021 minimal CAD. RHC, normal pressures  Echo 05/26/2021 EF 45 to 50%, grade 1 diastolic dysfunction, RVSP 51, dilated aortic root 41 mm  PFTs 11/2020 normal ratio, no airway obstruction, no restriction, DLCO 71%  CT chest  11/2020, no evidence of ILD  CTA chest 6/15>> small peripheral pulmonary embolus in a lateral segment right lower lobe pulmonary artery branch.   ABG 7.43/33/126/22  HISTORY OF PRESENT ILLNESS: 65 year old man originally from [place] with past medical history of RA on Humira and OSA maintained on CPAP.  He had a history of COVID infection in 07/2020 with concern for long COVID symptoms.  He was evaluated 11/2020 in our office by Dr. Francine Graven with shortness of breath symptoms ongoing since August.  PFTs at present did not show any evidence of airway obstruction or restriction but had an isolated decrease in DLCO.  CT did not show any evidence of ILD He was admitted 05/2021 with dyspnea and chest pain after a recent trip to [place], found to have an acute subsegmental PE.  He was treated with IV heparin and underwent left heart cath which showed minimal CAD, surprisingly right heart cath did not show any evidence of elevated pressures which were noted on echo.  He was transitioned to Eliquis completed 7 days of the higher dose and has now been maintained on 5 mg twice daily .  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  He noted  that his oxygen saturation at home when walking was as low as 76% on 1 occasion.  He reports dyspnea on climbing stairs  He has a prior history of PVC ablation by Dr. Ladona Ridgel and a dilated aortic root and has been evaluated by cardiology as an outpatient.  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 Mayford Knife), Pneumonia due to COVID-19 virus, PVC's (premature ventricular contractions) (cardiologist--- dr t. Mayford Knife), and Rheumatoid arthritis involving multiple sites [facility]).  has a past surgical history that includes PVC ABLATION (N/A, 10/07/2018); Incisional hernia repair (02-23-2016   @[facility] ); Laparoscopic inguinal hernia repair (Bilateral, 08-22-2015  @[facility] ); Umbilical hernia repair (child); Hydrocele surgery (Left, 06/14/2020); and RIGHT/LEFT HEART CATH AND CORONARY ANGIOGRAPHY (N/A, 05/29/2021). Prior to Admission medications   Medication Sig Start Date End Date Taking? Authorizing Provider  acetaminophen-codeine (TYLENOL #3) 300-30 MG tablet Take 1-2 tablets by mouth as needed for moderate pain. 05/06/19   [provider]  albuterol (VENTOLIN HFA) 108 (90 Base) MCG/ACT inhaler Inhale 2 puffs into the lungs every 4 (four) hours as needed for wheezing or shortness of breath. 03/02/21   Betancourt, 05/08/19, NP  apixaban (ELIQUIS) 5 MG  TABS tablet Take 2 tablets (10 mg total) by mouth 2 (two) times daily. 05/30/21   Maness, Loistine Chance, MD  apixaban (ELIQUIS) 5 MG TABS tablet Take 1 tablet (5 mg total) by mouth 2 (two) times daily. 06/06/21   Maness, Loistine Chance, MD  atorvastatin (LIPITOR) 40 MG tablet  Take 1 tablet (40 mg total) by mouth daily. 01/09/21   Quintella Reichert, MD  chlorthalidone (HYGROTON) 25 MG tablet Take 25 mg by mouth daily. 04/02/21   [provider]  colchicine 0.6 MG tablet Take 1-2 tablets by mouth daily as needed (gout).  01/31/16   [provider]  diclofenac (VOLTAREN) 75 MG EC tablet Take 75 mg by mouth 2 (two) times daily. 08/03/20   [provider]  diltiazem (CARDIZEM CD) 180 MG 24 hr capsule Take 1 capsule (180 mg total) by mouth daily. 02/27/21   Quintella Reichert, MD  fluticasone (FLONASE) 50 MCG/ACT nasal spray SPRAY ONE SPRAY IN EACH NOSTRIL TWICE DAILY 03/22/20   Betancourt, Jarold Song, NP  Fluticasone-Salmeterol (ADVAIR) 250-50 MCG/DOSE AEPB Inhale 1 puff into the lungs every 12 (twelve) hours. 11/28/20   Martina Sinner, MD  folic acid (FOLVITE) 1 MG tablet Take 1 mg by mouth daily. 01/29/20   [provider]  gabapentin (NEURONTIN) 600 MG tablet Take 600 mg by mouth 3 (three) times daily.  02/04/19   [provider]  HUMIRA PEN 40 MG/0.4ML PNKT Inject 40 mg as directed every 14 (fourteen) days. 04/09/21   [provider]  hydrALAZINE (APRESOLINE) 100 MG tablet TAKE ONE TABLET BY MOUTH THREE TIMES A DAY Patient taking differently: Take 100 mg by mouth 3 (three) times daily. 04/03/21   Dunn, Tacey Ruiz, PA-C  losartan (COZAAR) 100 MG tablet Take 1 tablet (100 mg total) by mouth daily. Please keep upcoming appt in January 2022 with Dr. Mayford Knife before anymore refills. Thank you 12/16/20   Quintella Reichert, MD  methotrexate 2.5 MG tablet Take 15 mg by mouth every Friday. 06/22/20   [provider]  montelukast (SINGULAIR) 10 MG tablet Take 1 tablet (10 mg total) by mouth at bedtime. 05/31/21   Betancourt, Jarold Song, NP  nebivolol (BYSTOLIC) 5 MG tablet TAKE TWO TABLETS BY MOUTH EVERY NIGHT AT BEDTIME Patient taking differently: Take 10 mg by mouth at bedtime. 03/06/21   Quintella Reichert, MD  Potassium Chloride ER 20 MEQ TBCR Take 1  tablet in the morning and 1 at night Patient taking differently: Take 20 mEq by mouth in the morning and at bedtime. 05/30/21   Maness, Loistine Chance, MD  potassium chloride SA (KLOR-CON M20) 20 MEQ tablet Take 1 tablet (20 mEq total) by mouth 2 (two) times daily. 05/30/21   Maness, Loistine Chance, MD  predniSONE (DELTASONE) 5 MG tablet Take 10 mg by mouth daily.  01/29/20   [provider]  sildenafil (REVATIO) 20 MG tablet Take 20 mg by mouth daily as needed (erectile dysfunction). 08/03/20   [provider]  spironolactone (ALDACTONE) 25 MG tablet Take 0.5 tablets (12.5 mg total) by mouth daily. 05/31/21   Maness, Loistine Chance, MD   Allergies  Allergen Reactions   Amlodipine Besylate Swelling    Leg swelling with  daily am dosing; decreased with BID  dosing   Aspirin Itching    FAMILY HISTORY:  family history includes Cardiomyopathy in his mother; Heart attack in his father. SOCIAL HISTORY:  reports that he has never smoked. He has never used smokeless tobacco. He reports current alcohol use  of about 5.0 - 7.0 standard drinks of alcohol per week. He reports that he does not use drugs.  REVIEW OF SYSTEMS:   Constitutional: Negative for fever, chills, weight loss, malaise/fatigue and diaphoresis.  HENT: Negative for hearing loss, ear pain, nosebleeds, congestion, sore throat, neck pain, tinnitus and ear discharge.   Eyes: Negative for blurred vision, double vision, photophobia, pain, discharge and redness.  Respiratory: Negative for cough, hemoptysis, sputum production, wheezing and stridor.   Cardiovascular: Negative for chest pain, palpitations, orthopnea, claudication, leg swelling and PND.  Gastrointestinal: Negative for heartburn, nausea, vomiting, abdominal pain, diarrhea, constipation, blood in stool and melena.  Genitourinary: Negative for dysuria, urgency, frequency, hematuria and flank pain.  Musculoskeletal: Negative for myalgias, back pain, joint pain and falls.  Skin:  Negative for itching and rash.  Neurological: Negative for dizziness, tingling, tremors, sensory change, speech change, focal weakness, seizures, loss of consciousness, weakness and headaches.  Endo/Heme/Allergies: Negative for environmental allergies and polydipsia. Does not bruise/bleed easily.  SUBJECTIVE:   VITAL SIGNS: Temp:  [97.7 F (36.5 C)-98.2 F (36.8 C)] 97.9 F (36.6 C) (06/29 0854) Pulse Rate:  [29-95] 71 (06/29 0854) Resp:  [16-25] 18 (06/29 0737) BP: (128-151)/(61-101) 150/99 (06/29 0854) SpO2:  [87 %-99 %] 97 % (06/29 0854) Weight:  [112.9 kg-118.2 kg] 112.9 kg (06/29 0100)  PHYSICAL EXAMINATION: Gen. Pleasant, obese, in no distress, normal affect ENT - no pallor,icterus, no post nasal drip, class 2 airway Neck: No JVD, no thyromegaly, no carotid bruits Lungs: no use of accessory muscles, no dullness to percussion, bibasal dry rales no rhonchi  Cardiovascular: Rhythm regular, heart sounds  normal, no murmurs or gallops, 1+ peripheral edema Abdomen: soft and non-tender, no hepatosplenomegaly, BS normal. Musculoskeletal: No deformities, no cyanosis or clubbing Neuro:  alert, non focal, no tremors   Recent Labs  Lab 06/06/21 1703 06/07/21 0344  NA 137 138  K 4.3 3.7  CL 108 108  CO2 20* 21*  BUN 27* 20  CREATININE 1.65* 1.26*  GLUCOSE 129* 86   Recent Labs  Lab 06/06/21 1703 06/07/21 0344  HGB 12.5* 11.4*  HCT 37.1* 34.8*  WBC 6.8 4.2  PLT 270 211   CT Angio Chest PE W and/or Wo Contrast  Result Date: 06/06/2021 CLINICAL DATA:  Hypoxia EXAM: CT ANGIOGRAPHY CHEST WITH CONTRAST TECHNIQUE: Multidetector CT imaging of the chest was performed using the standard protocol during bolus administration of intravenous contrast. Multiplanar CT image reconstructions and MIPs were obtained to evaluate the vascular anatomy. CONTRAST:  80mL OMNIPAQUE IOHEXOL 350 MG/ML SOLN COMPARISON:  CT 06/06/2021, 05/24/2021 FINDINGS: Cardiovascular: Satisfactory opacification of  the pulmonary arteries to the segmental level. No evidence of pulmonary embolism. Mild cardiomegaly. Coronary vascular calcification. Ascending aortic aneurysm measuring up to 4.4 cm. No dissection is seen. Mild atherosclerosis. No pericardial effusion. Mediastinum/Nodes: Midline trachea. No thyroid mass. No suspicious adenopathy. Esophagus within normal limits. Lungs/Pleura: Lungs are clear. No pleural effusion or pneumothorax. Upper Abdomen: No acute abnormality Musculoskeletal: No chest wall abnormality. No acute or significant osseous findings. Review of the MIP images confirms the above findings. IMPRESSION: 1. Negative for acute pulmonary embolus or aortic dissection. 2. Aneurysmal dilatation of the ascending thoracic aorta up to 4.4 cm. Recommend annual imaging followup by CTA or MRA. This recommendation follows 2010 ACCF/AHA/AATS/ACR/ASA/SCA/SCAI/SIR/STS/SVM Guidelines for the Diagnosis and Management of Patients with Thoracic Aortic Disease. Circulation. 2010; 121: M841-L244. Aortic aneurysm NOS (ICD10-I71.9) Aortic Atherosclerosis (ICD10-I70.0).Aortic aneurysm NOS (ICD10-I71.9). Electronically Signed   By: Adrian Prows.D.  On: 06/06/2021 21:20   CT Angio Chest PE W and/or Wo Contrast  Result Date: 06/06/2021 CLINICAL DATA:  Hypoxia. EXAM: CT ANGIOGRAPHY CHEST WITH CONTRAST TECHNIQUE: Multidetector CT imaging of the chest was performed using the standard protocol during bolus administration of intravenous contrast. Multiplanar CT image reconstructions and MIPs were obtained to evaluate the vascular anatomy. CONTRAST:  OMNIPAQUE IOHEXOL 350 MG/ML SOLN COMPARISON:  May 24, 2021. FINDINGS: Cardiovascular: There is inadequate contrast opacification of pulmonary arteries to evaluate for pulmonary embolus. Preferential filling of the thoracic aorta is noted. No dissection is noted. 4.4 cm ascending thoracic aortic aneurysm is noted. Mild cardiomegaly is noted. No pericardial effusion is noted.  Mediastinum/Nodes: No enlarged mediastinal, hilar, or axillary lymph nodes. Thyroid gland, trachea, and esophagus demonstrate no significant findings. Lungs/Pleura: Lungs are clear. No pleural effusion or pneumothorax. Upper Abdomen: No acute abnormality. Musculoskeletal: No chest wall abnormality. No acute or significant osseous findings. Review of the MIP images confirms the above findings. IMPRESSION: Inadequate contrast opacification of pulmonary arteries is noted most likely due to timing issue, and therefore pulmonary embolus cannot be excluded on the basis of this exam. There is no evidence of thoracic aortic dissection. 4.4 cm ascending thoracic aortic aneurysm. Recommend annual imaging followup by CTA or MRA. This recommendation follows 2010 ACCF/AHA/AATS/ACR/ASA/SCA/SCAI/SIR/STS/SVM Guidelines for the Diagnosis and Management of Patients with Thoracic Aortic Disease. Circulation. 2010; 121: H607-P710. Aortic aneurysm NOS (ICD10-I71.9). Electronically Signed   By: Lupita Raider M.D.   On: 06/06/2021 18:01    ASSESSMENT / PLAN:  Dyspnea on exertion for at least 6 months, progressive. Transient hypoxia, admitted with saturation 88%, now improved to 97% and does not desaturate on ambulation today Recent subsegmental PE -which appears to have resolved on CT angiogram 6/28  -Pulmonary artery pressures were noted to be elevated on echo but not on right heart cath.  PFTs showed isolated decrease in DLCO suggesting pulmonary vascular disease.  No significant restriction were noted.  Serial CT angiograms were independently reviewed which showed bibasal atelectasis.  With dry bibasilar crackles, this raises the question of occult ILD not picked up on CT angiogram. We will obtain high-resolution CT chest to clarify.  -Other possibility for transient hypoxia could be fluid retention, although LV function seems to have improved and BNP was low, this may be on the basis of chronic steroids and salt intake.   He is maintained on Aldactone  Discussed with patient and his daughter Jaclynn Guarneri Outpatient follow-up has been arranged for 7/7 at pulmonary office   Cyril Mourning MD. [facility].  Pulmonary & Critical care Pager : [phone]  If no response to pager , please call [phone] until 7 pm After 7:00 pm call Elink  [phone]    06/07/2021, 9:01 AM

## 2021-06-07 NOTE — Therapy (Signed)
Occupational Therapy Evaluation Patient Details Name: [place] MRN: [id number] DOB: [birth year] Today's Date: 06/07/2021    History of Present Illness Pt is a 65 y.o. male admitted 06/06/21 with dyspnea and cough. CTA negative for acute PE. Of note, recent admission 2 wks prior for PE. 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   Clinical Impression   Pt was seen for OT assessment today and is motivated for therapy. He is with noted generalized weakness and c/o SOB during ADL's but is overall Mod I-Supervision assist. O2 SATs during ADL today while standing at sink and performing toilet transfer, was 95% despite increased SOB and pt taking rest break. He should benefit from acute OT to address need for pt/family education, energy conservation techniques and assistance in increasing activity tolerance via therapeutic activity. Issue EC techniques next visit and discuss possible a/e.    Follow Up Recommendations  No OT follow up;Supervision - Intermittent (PRN family assistance during bathing in tub due to PMH, SOB and overall deconditioning)    Equipment Recommendations  None recommended by OT    Recommendations for Other Services PT consult     Precautions / Restrictions Precautions Precautions: Other (comment) Precaution Comments: DOE Restrictions Weight Bearing Restrictions: No      Mobility Bed Mobility Overal bed mobility: Independent                  Transfers Overall transfer level: Independent Equipment used: None Transfers: Sit to/from Stand Sit to Stand: Modified independent (Device/Increase time) Stand pivot transfers: Modified independent (Device/Increase time);Supervision (secondary to SOB)            Balance Overall balance assessment: No apparent balance deficits (not formally assessed)                                         ADL either performed or  assessed with clinical judgement   ADL Overall ADL's : Modified independent (Pt becomes SOB with little ADL activity however pulse ox was 95% after standing at sink for ADL's.)                                             Vision   Vision Assessment?:  (NT)     Perception     Praxis      Pertinent Vitals/Pain Pain Assessment: No/denies pain Pain Intervention(s): Monitored during session     Hand Dominance Right   Extremity/Trunk Assessment Upper Extremity Assessment Upper Extremity Assessment: Overall WFL for tasks assessed   Lower Extremity Assessment Lower Extremity Assessment: Overall WFL for tasks assessed       Communication Communication Communication: No difficulties   Cognition Arousal/Alertness: Awake/alert Behavior During Therapy: WFL for tasks assessed/performed Overall Cognitive Status: Within Functional Limits for tasks assessed                                     General Comments  Pt reports owning pulse ox and checking SpO2 at home; main limiting factor is SOB with activity. Educ re: energy conservation, activity pacing, pursed lip breathing, activity recommendations    Exercises     Shoulder Instructions  Home Living Family/patient expects to be discharged to:: Private residence Living Arrangements: Spouse/significant other Available Help at Discharge: Family;Available 24 hours/day Type of Home: House Home Access: Stairs to enter [employer] of Steps: 5-6 Entrance Stairs-Rails: Can reach both Home Layout: Multi-level Alternate Level Stairs-Number of Steps: Flight Alternate Level Stairs-Rails: Left;Right Bathroom Shower/Tub: Chief Strategy Officer: Standard     Home Equipment: Environmental consultant - 2 wheels;Cane - single point          Prior Functioning/Environment Level of Independence: Independent        Comments: Independent without DME, but has been limited by DOE        OT  Problem List: Decreased strength;Decreased activity tolerance;Decreased knowledge of use of DME or AE      OT Treatment/Interventions: Self-care/ADL training;Energy conservation;DME and/or AE instruction;Therapeutic activities;Patient/family education    OT Goals(Current goals can be found in the care plan section) Acute Rehab OT Goals Patient Stated Goal: Improved breathing with activity OT Goal Formulation: With patient Time For Goal Achievement: 06/21/21 Potential to Achieve Goals: Good ADL Goals Pt Will Perform Grooming: Independently;standing Pt Will Perform Lower Body Dressing: with modified independence;with adaptive equipment;sit to/from stand Pt Will Perform Tub/Shower Transfer: Tub transfer;with modified independence;ambulating Additional ADL Goal #1: Pt will Independently state 2-3 energy conservation techniques for ADL's and homemaking tasks and implement w/o verbal cues  OT Frequency: Min 2X/week   Barriers to D/C:            Co-evaluation              AM-PAC OT "6 Clicks" Daily Activity     Outcome Measure Help from another person eating meals?: None Help from another person taking care of personal grooming?: None Help from another person toileting, which includes using toliet, bedpan, or urinal?: None Help from another person bathing (including washing, rinsing, drying)?: A Little Help from another person to put on and taking off regular upper body clothing?: A Little Help from another person to put on and taking off regular lower body clothing?: A Little 6 Click Score: 21   End of Session    Activity Tolerance:   Patient left:    OT Visit Diagnosis: Muscle weakness (generalized) (M62.81);Other (comment);Unsteadiness on feet (R26.81) (Shortness of breath and generalized weakness with functional activitiy and ambulation)                Time: 0820-0900 OT Time Calculation (min): 40 min Charges:  OT General Charges $OT Visit: 1 Visit OT Evaluation $OT  Eval Low Complexity: 1 Low OT Treatments $Self Care/Home Management : 23-37 mins   Tanaia Hawkey Beth Dixon, OTR/L 06/07/2021, 11:10 AM

## 2021-06-08 DIAGNOSIS — I2782 Chronic pulmonary embolism: Secondary | ICD-10-CM

## 2021-06-08 DIAGNOSIS — I493 Ventricular premature depolarization: Secondary | ICD-10-CM

## 2021-06-08 DIAGNOSIS — I2609 Other pulmonary embolism with acute cor pulmonale: Secondary | ICD-10-CM

## 2021-06-08 LAB — BASIC METABOLIC PANEL
Anion gap: 9 (ref 5–15)
BUN: 17 mg/dL (ref 8–23)
CO2: 21 mmol/L — ABNORMAL LOW (ref 22–32)
Calcium: 9.3 mg/dL (ref 8.9–10.3)
Chloride: 107 mmol/L (ref 98–111)
Creatinine, Ser: 1.1 mg/dL (ref 0.61–1.24)
GFR, Estimated: >60 mL/min (ref >60)
Glucose, Bld: 98 mg/dL (ref 70–99)
Potassium: 3.9 mmol/L (ref 3.5–5.1)
Sodium: 137 mmol/L (ref 135–145)

## 2021-06-08 MED ORDER — POTASSIUM CHLORIDE ER 20 MEQ PO TBCR: 20.0000 meq | EXTENDED_RELEASE_TABLET | Freq: Two times a day (BID) | ORAL | Status: DC

## 2021-06-08 MED ORDER — NEBIVOLOL HCL 5 MG PO TABS: 10.0000 mg | ORAL_TABLET | Freq: Every evening | ORAL | Status: DC

## 2021-06-08 MED ORDER — HYDRALAZINE HCL 100 MG PO TABS: 100.0000 mg | ORAL_TABLET | Freq: Three times a day (TID) | ORAL | Status: DC

## 2021-06-08 MED ORDER — FLUTICASONE PROPIONATE 50 MCG/ACT NA SUSP: 1.0000 | Freq: Every day | NASAL | Status: AC

## 2021-06-08 NOTE — Discharge Summary (Signed)
Family Medicine Teaching [facility] Discharge Summary  Patient name: Ryan Glass Medical record number: [id number] Date of birth: 25-Feb-1956 Age: 65 y.o. Gender: male Date of Admission: 06/06/2021  Date of Discharge: 06/08/2021 Admitting Physician: Leighton Roach McDiarmid, MD  Primary Care Provider: [name], MD Consultants: Pulmonology  Indication for Hospitalization: Hypoxia  Discharge Diagnoses/Problem List:  Acute on chronic respiratory failure OSA PVCs History of pulmonary embolism Dyspnea on exertion HF with midrange ejection fraction LVH CAD, nonobstructive On chronic therapy for rheumatoid arthritis  Disposition: Home  Discharge Condition: Stable, improved  Discharge Exam:  General: Well-appearing, NAD, sitting up in bed CV: RRR, no murmur appreciated no JVD appreciated Respiratory: Breathing abdomen room air, no increased work of breathing, clear to auscultation bilaterally Abdomen: Soft, nontender, nondistended, bowel sounds present Extremities: Moving all extremities equally and appropriately, no deformities noted, no cyanosis, no peripheral edema  Brief Hospital Course:  Ryan Glass is a 65 y.o. male presenting with dyspnea. PMH is significant for PVCs s/p PVC ablation (2019), dilated aortic root, HTN, HLD, lower extremity edema,COVID 19 infection, colon polyps s/p polypectomy (2018), OSA, and Hx of PE.  Acute on chronic respiratory failure Patient initially admitted with increased work of breathing and home O2 measurements in the 70s with exertion.  In the ED, patient was found to have an O2 saturation of 87%.  CTA was completed twice in the ED as the first one completed could not rule out PE and a repeat CT was completed which showed no PE. Pulmonology was consulted and patient underwent high-resolution CT which showed no interstitial lung disease and no change from prior imaging.  Patient was monitored on pulse ox during activity and was able to maintain  his saturations >94%.  Unsure what exactly caused the transient hypoxia, possible relation to fluid retention though BNP low and LV function was improved.  Patient advised to keep the weight record and O2 saturation monitoring and follow-up in office.    All other chronic conditions were stable during hospitalization at home medications continued as appropriate.  See H&P for further information.   Issues for follow-up: Follow-up anxiety and consider treatment outpatient Patient to follow-up with pulmonology on 7/7, may need a CPET if persistent. Patient to keep a weight record and monitor O2 saturations Unchanged enlargement of the tubular ascending thoracic aorta, measuring up to 4.4 x 4.4 cm. Recommend annual imaging followup by CTA or MRA if not otherwise imaged 5.  Recheck potassium on BMP at follow up    Significant Procedures: None  Significant Labs and Imaging:  Recent Labs  Lab 06/06/21 1703 06/07/21 0344  WBC 6.8 4.2  HGB 12.5* 11.4*  HCT 37.1* 34.8*  PLT 270 211   Recent Labs  Lab 06/06/21 1703 06/07/21 0344 06/08/21 0157  NA 137 138 137  K 4.3 3.7 3.9  CL 108 108 107  CO2 20* 21* 21*  GLUCOSE 129* 86 98  BUN 27* 20 17  CREATININE 1.65* 1.26* 1.10  CALCIUM 9.5 9.3 9.3  MG 1.9  --   --   ALKPHOS 74  --   --   AST 28  --   --   ALT 29  --   --   ALBUMIN 3.7  --   --     CT Angio Chest PE W and/or Wo Contrast  Result Date: 06/06/2021 CLINICAL DATA:  Hypoxia EXAM: CT ANGIOGRAPHY CHEST WITH CONTRAST TECHNIQUE: Multidetector CT imaging of the chest was performed using the standard  protocol during bolus administration of intravenous contrast. Multiplanar CT image reconstructions and MIPs were obtained to evaluate the vascular anatomy. CONTRAST:  42mL OMNIPAQUE IOHEXOL 350 MG/ML SOLN COMPARISON:  CT 06/06/2021, 05/24/2021 FINDINGS: Cardiovascular: Satisfactory opacification of the pulmonary arteries to the segmental level. No evidence of pulmonary embolism. Mild  cardiomegaly. Coronary vascular calcification. Ascending aortic aneurysm measuring up to 4.4 cm. No dissection is seen. Mild atherosclerosis. No pericardial effusion. Mediastinum/Nodes: Midline trachea. No thyroid mass. No suspicious adenopathy. Esophagus within normal limits. Lungs/Pleura: Lungs are clear. No pleural effusion or pneumothorax. Upper Abdomen: No acute abnormality Musculoskeletal: No chest wall abnormality. No acute or significant osseous findings. Review of the MIP images confirms the above findings. IMPRESSION: 1. Negative for acute pulmonary embolus or aortic dissection. 2. Aneurysmal dilatation of the ascending thoracic aorta up to 4.4 cm. Recommend annual imaging followup by CTA or MRA. This recommendation follows 2010 ACCF/AHA/AATS/ACR/ASA/SCA/SCAI/SIR/STS/SVM Guidelines for the Diagnosis and Management of Patients with Thoracic Aortic Disease. Circulation. 2010; 121: G387-F643. Aortic aneurysm NOS (ICD10-I71.9) Aortic Atherosclerosis (ICD10-I70.0).Aortic aneurysm NOS (ICD10-I71.9). Electronically Signed   By: Jasmine Pang M.D.   On: 06/06/2021 21:20   CT Angio Chest PE W and/or Wo Contrast  Result Date: 06/06/2021 CLINICAL DATA:  Hypoxia. EXAM: CT ANGIOGRAPHY CHEST WITH CONTRAST TECHNIQUE: Multidetector CT imaging of the chest was performed using the standard protocol during bolus administration of intravenous contrast. Multiplanar CT image reconstructions and MIPs were obtained to evaluate the vascular anatomy. CONTRAST:  OMNIPAQUE IOHEXOL 350 MG/ML SOLN COMPARISON:  May 24, 2021. FINDINGS: Cardiovascular: There is inadequate contrast opacification of pulmonary arteries to evaluate for pulmonary embolus. Preferential filling of the thoracic aorta is noted. No dissection is noted. 4.4 cm ascending thoracic aortic aneurysm is noted. Mild cardiomegaly is noted. No pericardial effusion is noted. Mediastinum/Nodes: No enlarged mediastinal, hilar, or axillary lymph nodes. Thyroid gland,  trachea, and esophagus demonstrate no significant findings. Lungs/Pleura: Lungs are clear. No pleural effusion or pneumothorax. Upper Abdomen: No acute abnormality. Musculoskeletal: No chest wall abnormality. No acute or significant osseous findings. Review of the MIP images confirms the above findings. IMPRESSION: Inadequate contrast opacification of pulmonary arteries is noted most likely due to timing issue, and therefore pulmonary embolus cannot be excluded on the basis of this exam. There is no evidence of thoracic aortic dissection. 4.4 cm ascending thoracic aortic aneurysm. Recommend annual imaging followup by CTA or MRA. This recommendation follows 2010 ACCF/AHA/AATS/ACR/ASA/SCA/SCAI/SIR/STS/SVM Guidelines for the Diagnosis and Management of Patients with Thoracic Aortic Disease. Circulation. 2010; 121: P295-J884. Aortic aneurysm NOS (ICD10-I71.9). Electronically Signed   By: Lupita Raider M.D.   On: 06/06/2021 18:01   CT Chest High Resolution  Result Date: 06/07/2021 CLINICAL DATA:  Interstitial lung disease EXAM: CT CHEST WITHOUT CONTRAST TECHNIQUE: Multidetector CT imaging of the chest was performed following the standard protocol without intravenous contrast. High resolution imaging of the lungs, as well as inspiratory and expiratory imaging, was performed. COMPARISON:  06/06/2021, 05/24/2021, 11/29/2020 FINDINGS: Cardiovascular: Aortic atherosclerosis. Unchanged enlargement of the tubular ascending thoracic aorta, measuring up to 4.4 x 4.4 cm. Cardiomegaly. Left and right coronary artery calcifications. No pericardial effusion. Mediastinum/Nodes: No enlarged mediastinal, hilar, or axillary lymph nodes. Thyroid gland, trachea, and esophagus demonstrate no significant findings. Lungs/Pleura: Parke Simmers appearing, bandlike scarring and or partial atelectasis of the dependent lungs, unchanged compared to prior examination. No significant air trapping on expiratory phase imaging no pleural effusion or  pneumothorax. Upper Abdomen: No acute abnormality. Musculoskeletal: No chest wall mass or suspicious bone  lesions identified. IMPRESSION: 1. Bland appearing, bandlike scarring and or partial atelectasis of the dependent lungs, unchanged compared to prior examination. No evidence of fibrotic interstitial lung disease. No significant air trapping on expiratory phase imaging. 2. Cardiomegaly and coronary artery disease. 3. Unchanged enlargement of the tubular ascending thoracic aorta, measuring up to 4.4 x 4.4 cm. Recommend annual imaging followup by CTA or MRA if not otherwise imaged. This recommendation follows 2010 ACCF/AHA/AATS/ACR/ASA/SCA/SCAI/SIR/STS/SVM Guidelines for the Diagnosis and Management of Patients with Thoracic Aortic Disease. Circulation. 2010; 121: I356-Y616. Aortic aneurysm NOS (ICD10-I71.9) Aortic Atherosclerosis (ICD10-I70.0). Electronically Signed   By: Lauralyn Primes M.D.   On: 06/07/2021 15:12     Results/Tests Pending at Time of Discharge: None  Discharge Medications:  Allergies as of 06/08/2021       Reactions   Amlodipine Besylate Swelling   Leg swelling with 10mg  daily am dosing; decreased with BID 5mg  dosing   Aspirin Itching        Medication List     STOP taking these medications    amoxicillin 875 MG tablet Commonly known as: AMOXIL   diclofenac 75 MG EC tablet Commonly known as: VOLTAREN       TAKE these medications    acetaminophen-codeine 300-30 MG tablet Commonly known as: TYLENOL #3 Take 1-2 tablets by mouth as needed for moderate pain.   albuterol 108 (90 Base) MCG/ACT inhaler Commonly known as: VENTOLIN HFA Inhale 2 puffs into the lungs every 4 (four) hours as needed for wheezing or shortness of breath.   apixaban 5 MG Tabs tablet Commonly known as: ELIQUIS Take 1 tablet (5 mg total) by mouth 2 (two) times daily.   atorvastatin 40 MG tablet Commonly known as: LIPITOR Take 1 tablet (40 mg total) by mouth daily.   chlorthalidone 25 MG  tablet Commonly known as: HYGROTON Take 25 mg by mouth daily.   colchicine 0.6 MG tablet Take 1-2 tablets by mouth daily as needed (gout).   diltiazem 180 MG 24 hr capsule Commonly known as: Cardizem CD Take 1 capsule (180 mg total) by mouth daily.   Fluticasone-Salmeterol 250-50 MCG/DOSE Aepb Commonly known as: ADVAIR Inhale 1 puff into the lungs every 12 (twelve) hours.   folic acid 1 MG tablet Commonly known as: FOLVITE Take 1 mg by mouth daily.   gabapentin 600 MG tablet Commonly known as: NEURONTIN Take 600 mg by mouth 3 (three) times daily.   Humira Pen 40 MG/0.4ML Pnkt Generic drug: Adalimumab Inject 40 mg as directed every 14 (fourteen) days.   losartan 100 MG tablet Commonly known as: COZAAR Take 1 tablet (100 mg total) by mouth daily. Please keep upcoming appt in January 2022 with Dr. Mayford Knife before anymore refills. Thank you   methotrexate 2.5 MG tablet Take 15 mg by mouth every Friday.   montelukast 10 MG tablet Commonly known as: SINGULAIR Take 1 tablet (10 mg total) by mouth at bedtime.   predniSONE 5 MG tablet Commonly known as: DELTASONE Take 5 mg by mouth daily.   sildenafil 20 MG tablet Commonly known as: REVATIO Take 20 mg by mouth daily as needed (erectile dysfunction).   spironolactone 25 MG tablet Commonly known as: ALDACTONE Take 0.5 tablets (12.5 mg total) by mouth daily.       ASK your doctor about these medications    fluticasone 50 MCG/ACT nasal spray Commonly known as: FLONASE SPRAY ONE SPRAY IN EACH NOSTRIL TWICE DAILY   hydrALAZINE 100 MG tablet Commonly known as: APRESOLINE TAKE ONE TABLET  BY MOUTH THREE TIMES A DAY   nebivolol 5 MG tablet Commonly known as: BYSTOLIC TAKE TWO TABLETS BY MOUTH EVERY NIGHT AT BEDTIME   Potassium Chloride ER 20 MEQ Tbcr Take 1 tablet in the morning and 1 at night        Discharge Instructions: Please refer to Patient Instructions section of EMR for full details.  Patient was  counseled important signs and symptoms that should prompt return to medical care, changes in medications, dietary instructions, activity restrictions, and follow up appointments.   Follow-Up Appointments:  Follow-up Information     [name], MD. Schedule an appointment as soon as possible for a visit in 3 day(s).   Specialty: Family Medicine Contact information: [address] [place] [address] [phone]         Quintella Reichert, MD .   Specialty: Cardiology Contact information: [phone] [address] [place] [address] [phone]         Marinus Maw, MD .   Specialty: Cardiology Contact information: [phone] [address] [place] [address] [phone]         Julio Sicks, NP. Go on 06/15/2021.   Specialty: Pulmonary Disease Why: Your appointment is at 11:30 AM, please arrive at least 15 minutes early. Contact information: [address] [place] [address] [phone]                 Evelena Leyden, DO 06/08/2021, 10:58 AM PGY-1, [facility]

## 2021-06-08 NOTE — Plan of Care (Signed)
  Problem: Health Behavior/Discharge Planning: Goal: Ability to manage health-related needs will improve Outcome: Adequate for Discharge   Problem: Education: Goal: Ability to demonstrate management of disease process will improve Outcome: Adequate for Discharge Goal: Ability to verbalize understanding of medication therapies will improve Outcome: Adequate for Discharge   Problem: Education: Goal: Ability to verbalize understanding of medication therapies will improve Outcome: Adequate for Discharge   Problem: Activity: Goal: Capacity to carry out activities will improve Outcome: Adequate for Discharge   Problem: Cardiac: Goal: Ability to achieve and maintain adequate cardiopulmonary perfusion will improve Outcome: Adequate for Discharge   Problem: Acute Rehab PT Goals(only PT should resolve) Goal: Pt Will Go Up/Down Stairs Outcome: Adequate for Discharge

## 2021-06-08 NOTE — Progress Notes (Signed)
D/C instructions given and reviewed. Tele and IV removed, tolerated well. Will notify staff once wife arrives to transport home.

## 2021-06-08 NOTE — Discharge Instructions (Signed)
You were admitted to the hospital due to having an episode of low oxygen saturations and was placed on oxygen while here initially.  He did well overnight and were able to ambulate without needing oxygen to maintain her saturations.  Pulmonology saw you and you underwent a CT scan that showed no signs of lung disease at this time.  You have a follow-up appoint with pulmonology in the outpatient setting on 7/7, please make sure to attend this appointment as well as make an appointment with your PCP in the next several days.

## 2021-06-08 NOTE — Progress Notes (Signed)
SATURATION QUALIFICATIONS: (This note is used to comply with regulatory documentation for home oxygen)  Patient Saturations on Room Air at Rest = 97%  Patient Saturations on Room Air while Ambulating = 93%  Patient ambulated approximately 450 feet. Resting O2 sat 97% on RA. Resting HR 70 Patient stated that he felt a little SOB while ambulating. O2 sat remained greater than or equal to 93%. HR 84 Patient resting on side of bed. O2 sat 98% on RA. HR 67.

## 2021-06-08 NOTE — Progress Notes (Addendum)
Name: Ryan Glass MRN: [id number] DOB: Mar 14, 1956    ADMISSION DATE:  06/06/2021 CONSULTATION DATE:  06/08/2021   REFERRING MD :  Sheryle Hail  CHIEF COMPLAINT: Dyspnea on exertion, hypoxia  BRIEF PATIENT DESCRIPTION: 65 year old admitted with dyspnea on exertion and hypoxia He has a history of recent subsegmental PE 05/2021, maintained on Eliquis, COVID infection in 07/2020  SIGNIFICANT EVENTS  05/2021 hospitalization pulmonary consult >> dyspnea out of proportion and predates subsegmental acute PE  STUDIES:  LHC 05/29/2021 minimal CAD. RHC, normal pressures  Echo 05/26/2021 EF 45 to 50%, grade 1 diastolic dysfunction, RVSP 51, dilated aortic root 41 mm  PFTs 11/2020 normal ratio, no airway obstruction, no restriction, DLCO 71%  CT chest  11/2020, no evidence of ILD  CTA chest 6/15>> small peripheral pulmonary embolus in a lateral segment right lower lobe pulmonary artery branch.   ABG 7.43/33/126/22  HISTORY OF PRESENT ILLNESS: 65 year old man originally from [place] with past medical history of RA on Humira and OSA maintained on CPAP.  He had a history of COVID infection in 07/2020 with concern for long COVID symptoms.  He was evaluated 11/2020 in our office by Dr. Francine Graven with shortness of breath symptoms ongoing since August.  PFTs at present did not show any evidence of airway obstruction or restriction but had an isolated decrease in DLCO.  CT did not show any evidence of ILD He was admitted 05/2021 with dyspnea and chest pain after a recent trip to [place], found to have an acute subsegmental PE.  He was treated with IV heparin and underwent left heart cath which showed minimal CAD, surprisingly right heart cath did not show any evidence of elevated pressures which were noted on echo.  He was transitioned to Eliquis completed 7 days of the higher dose and has now been maintained on 5 mg twice daily .  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  He noted  that his oxygen saturation at home when walking was as low as 76% on 1 occasion.  He reports dyspnea on climbing stairs  He has a prior history of PVC ablation by Dr. Ladona Ridgel and a dilated aortic root and has been evaluated by cardiology as an outpatient.  Event monitor showed frequent PVCs and 24-hour Holter showed 22% PVC load prior to ablation SUBJECTIVE:   No chest pain or dyspnea. Has been on room air since yesterday. Afebrile, no cough   VITAL SIGNS: Temp:  [97.6 F (36.4 C)-97.9 F (36.6 C)] 97.6 F (36.4 C) (06/30 0343) Pulse Rate:  [50-74] 60 (06/30 0631) Resp:  [18-24] 18 (06/30 0343) BP: (118-153)/(66-99) 153/91 (06/30 0631) SpO2:  [94 %-98 %] 94 % (06/30 0343) Weight:  [111.9 kg] 111.9 kg (06/30 0310)  PHYSICAL EXAMINATION: Gen. Pleasant, obese, in no distress, normal affect ENT - no pallor,icterus, no post nasal drip, class 2 airway Neck: No JVD, no thyromegaly, no carotid bruits Lungs: Clear breath sounds bilateral, no accessory muscle use Cardiovascular: Rhythm regular, heart sounds  normal, no murmurs or gallops, no peripheral edema Abdomen: soft and non-tender, no hepatosplenomegaly, BS normal. Musculoskeletal: No deformities, no cyanosis or clubbing Neuro:  alert, non focal, no tremors   Recent Labs  Lab 06/06/21 1703 06/07/21 0344 06/08/21 0157  NA 137 138 137  K 4.3 3.7 3.9  CL 108 108 107  CO2 20* 21* 21*  BUN 27* 20 17  CREATININE 1.65* 1.26* 1.10  GLUCOSE 129* 86 98  Recent Labs  Lab 06/06/21 1703 06/07/21 0344  HGB 12.5* 11.4*  HCT 37.1* 34.8*  WBC 6.8 4.2  PLT 270 211      ASSESSMENT / PLAN:  Dyspnea on exertion for at least 6 months, progressive. Transient hypoxia, admitted with saturation 88%, now improved to 97%, no desaturation on ambulation Recent subsegmental PE -which appears to have resolved on CT angiogram 6/28  -Pulmonary artery pressures were noted to be elevated on echo but not on right heart cath.  PFTs showed  isolated decrease in DLCO suggesting pulmonary vascular disease.  No significant restriction were noted.  Serial CT angiograms were independently reviewed  showed bibasal atelectasis.  HRCT does not show any evidence of pulmonary fibrosis  -Other possibility for transient hypoxia could be fluid retention, although LV function seems to have improved and BNP was low, this may be on the basis of chronic steroids and salt intake.  He is maintained on Aldactone -Possibility of reaction to Humira was considered but seems unlikely  I have advised him to keep a weight record and O2 saturation and present it to the office on follow-up.  May need CPET to clarify if persistent Outpatient follow-up has been arranged for 7/7 at pulmonary office   Cyril Mourning MD. [facility]. [facility] Pager : [phone]  If no response to pager , please call [phone] until 7 pm After 7:00 pm call Elink  [phone]    06/08/2021, 8:28 AM

## 2021-06-09 NOTE — Telephone Encounter (Signed)
Spoke with pt by phone. He reports he went to Dr. [name] on 6/28 and O2 was 78%. They told him to go to ED. He went to [facility] and was eventually transferred to [facility] and admitted.   Plan for him to still f/u with Pulmonology on 7/7 and further plans made from there. Possible additional imaging. No new orders on d/c. O2 was low to mid 90's on RA on d/c.   Weak, tired, still ShOB at home. He has misplaced the 2nd pulse oximeter he had purchased again so unable to check O2 at home. Going to look in the house again today but very winded after short walks even within the house. Endorses confusion when talking about the monitor. Reminded him that this is likely related to the low oxygen he has been experiencing. Denies new sx.   He denies further questions or concerns at the time, just ready to feel better and find out what is going on. Has RN and NP contact numbers if needed prior to next check in.

## 2021-06-11 NOTE — Telephone Encounter (Signed)
Late entry. Spoke with patient on 2 Jul via telephone s/p discharge from hospital.  He reported he saw PCM PA and sp02 in 53s RA and HR low was sent to [facility] and was there 17-2300 per patient and after CT scan sent to [facility] again for 2 days.  He is wondering if he should go to [facility] for second opinion on why he is having shortness of breath especially when walking outside.  He reported weight today down to 240lbs a t home no swelling in his legs but not feeling any better.  He hadn't checked his home sp02 today still having some runny notes and cough.  Stated he is still on antibiotic amoxicillin.  Hospital staff told him to stop diclofenac and amoxicillin but he did not stop amoxicillin only diclofenac.  Patient reported he slept last night from 2330 until 0730 usually he wakes up to urinate.  He was exhausted yesterday.  During telephone call pa tient checked BP 118/87 HR 46 sp02 91% RA spoke full sentences without difficulty A&OX3 no cough/nasal congestion/sniffing or throat clearing noted during 14 minute phone call.  Discussed hourly deep breathing when awake.  Patient does not have incentive spirometer at home.   Patient requested refill of proair inhaler to [facility] pharmacy as counter running low.  Next follow up 7 Jul with pulmonologist.  Patient stated he did n ot eat well in hospital due to "nasty food" and getting woken up at 0300 for weight checks very fatigued.  Proair refill called in to [facility] location as [facility] does not allow electronic Rxs #1 RF0.  HR notified patient not cleared to return onsite and follow up pending with pulmonologist.  Patient verbalized understanding information/instructions, agreed with plan of care and had no further questions at this time.

## 2021-06-14 ENCOUNTER — Other Ambulatory Visit: Payer: Self-pay

## 2021-06-14 MED ORDER — LOSARTAN POTASSIUM 100 MG PO TABS: 100.0000 mg | ORAL_TABLET | Freq: Every day | ORAL | 3 refills | Status: DC

## 2021-06-14 NOTE — Telephone Encounter (Signed)
Chart review patient has PCM and pulmonology appts scheduled for tomorrow follow up hospitalization

## 2021-06-15 ENCOUNTER — Ambulatory Visit (INDEPENDENT_AMBULATORY_CARE_PROVIDER_SITE_OTHER): Payer: No Typology Code available for payment source | Admitting: Adult Health

## 2021-06-15 ENCOUNTER — Encounter: Payer: Self-pay | Admitting: Adult Health

## 2021-06-15 ENCOUNTER — Telehealth: Payer: Self-pay | Admitting: Adult Health

## 2021-06-15 ENCOUNTER — Other Ambulatory Visit: Payer: Self-pay

## 2021-06-15 VITALS — BP 120/80 | HR 60 | Temp 98.0°F | Ht 70.0 in | Wt 250.6 lb

## 2021-06-15 DIAGNOSIS — R0600 Dyspnea, unspecified: Secondary | ICD-10-CM | POA: Diagnosis not present

## 2021-06-15 DIAGNOSIS — I5022 Chronic systolic (congestive) heart failure: Secondary | ICD-10-CM

## 2021-06-15 DIAGNOSIS — R0609 Other forms of dyspnea: Secondary | ICD-10-CM

## 2021-06-15 DIAGNOSIS — G4733 Obstructive sleep apnea (adult) (pediatric): Secondary | ICD-10-CM | POA: Diagnosis not present

## 2021-06-15 DIAGNOSIS — Z8616 Personal history of COVID-19: Secondary | ICD-10-CM

## 2021-06-15 DIAGNOSIS — I2699 Other pulmonary embolism without acute cor pulmonale: Secondary | ICD-10-CM | POA: Diagnosis not present

## 2021-06-15 DIAGNOSIS — I2510 Atherosclerotic heart disease of native coronary artery without angina pectoris: Secondary | ICD-10-CM

## 2021-06-15 DIAGNOSIS — I509 Heart failure, unspecified: Secondary | ICD-10-CM

## 2021-06-15 NOTE — Progress Notes (Signed)
@Patient  ID: Ryan Glass, male    DOB: March 22, 1956, 65 y.o.   MRN: [id number]  Chief Complaint  Patient presents with   Follow-up    Referring provider: [name], MD  HPI: 65 year old male never smoker seen for pulmonary consult November 15, 2020 for shortness of breath post COVID infection and COVID-pneumonia (COVID-positive July 2021) medical history significant for rheumatoid arthritis and sleep apnea,    TEST/EVENTS :  Echo performed on 11/11/20 which showed normal EF and normal RV function.  06/15/2021 Follow up : PE, Dyspnea , Post hospital follow up , OSA  Patient presents for a post hospital follow-up.  Patient has been hospitalized twice over the last month.  He was initially admitted May 25, 2021 with worsening shortness of breath after found to have a PE outpatient setting after he had traveled by airplane. CT chest angio May 25, 2021 small peripheral pulmonary embolus in the lateral segment right lower lobe pulmonary artery branch.  He was treated with IV heparin and transitioned to Eliquis.  2D echo showed EF 45 to 50%, mildly decreased LV function.  Severe asymmetrical left ventricular hypertrophy, grade 1 diastolic dysfunction, moderately reduced right ventricular systolic function.  Moderately enlarged ventricular size.  Moderately elevated pulmonary artery systolic pressure. He did have chest pain and ongoing shortness of breath.  He was seen by cardiology.  Underwent a cardiac cath that showed nonobstructive disease.  No pulmonary hypertension on right heart cath.  RV was mildly dilated but RV function was normal and LVEF was normal.  Unfortunately patient was readmitted June 07, 2021 for ongoing shortness of breath and hypoxemia. He was consulted by pulmonary.High-resolution CT chest June 07, 2021 showed bandlike scarring and partial atelectasis in the dependent lungs unchanged from prior exam.  No evidence of fibrotic interstitial lung disease.  A sending  thoracic aorta enlargement measuring 4.4 cm.  Since discharge he is feeling some better but continues to get short of breath with heavy activities.  He especially notes shortness of breath if he is trying to go up a hill or incline or stairs.  He has low activity tolerance.  Lower extremity swelling has been on and off.  He remains on chlorthalidone and Aldactone.  Patient says he does check his O2 saturations at home and when he is doing heavy activity outside especially if it is hot he will notice his O2 saturations dropping into the 80s.  Today in office walk test showed O2 saturations decreased to 88% on room air with ambulation.  Greater than 90% on room air at rest.  Patient does have obstructive sleep apnea is on nocturnal CPAP.  He says he wears a CPAP every night.  Does not miss any nights.  Feels rested.  Wears a CPAP for about 6 hours each night.  Does have to CPAP which he uses 1 in if he is in his recliner and 1 in his bedroom  He reports no prior history of blood clots.  Patient had flown to [place] prior to admission.  He works full-time.  Has not been able to go back to work.  Says he just wears out of energy very easily.   Allergies  Allergen Reactions   Amlodipine Besylate Swelling    Leg swelling with 10mg  daily am dosing; decreased with BID 5mg  dosing   Aspirin Itching    Immunization History  Administered Date(s) Administered   Influenza,inj,Quad PF,6+ Mos 09/09/2014, 09/10/2015, 10/09/2016, 10/01/2017, 09/26/2018, 09/22/2019, 09/09/2020   Influenza,inj,quad, With  Preservative 09/09/2014, 09/10/2015   Influenza-Unspecified 09/09/2014, 09/10/2015, 10/09/2016, 10/01/2017   Moderna SARS-COV2 Booster Vaccination 12/27/2020   PFIZER(Purple Top)SARS-COV-2 Vaccination 02/18/2020, 03/10/2020, 07/25/2020   Tdap 09/07/2008, 09/23/2017   Zoster Recombinat (Shingrix) 02/05/2018, 06/30/2018    Past Medical History:  Diagnosis Date   Ascending aortic aneurysm (HCC)    a. CT 05/2021  showed 4.4x4.4cm TAA.   Bilateral lower extremity edema    Chronic gout without tophus    followed by dr Sharmon Revere    (06-08-2020  per pt last episode right knee 3 wks ago)   CKD (chronic kidney disease), stage II    nephrology--- [facility] (10-29-2019 note in epic scanned in  media)   Heart failure with mid-range ejection fraction [facility]) 05/27/2021   05/26/21: Left ventricular ejection fraction, by estimation, is 45 to 50%. There is severe asymmetric left ventricular hypertrophy. G1DD present.    History of COVID-19 06/07/2021   Hypertension    followed by cardiology, dr t. turner   (05-14-2018 nuclear study in epic , normal perfusion with nuclear ef 61%)   Left hydrocele    Low serum potassium level 04/27/2014   OSA on CPAP    per pt uses every night   Palpitations followed by dr t. Mayford Knife   06-08-2020  still feels palipations due to PVCs when exertion but not with chest pain/ discomfort   Pneumonia due to COVID-19 virus    PVC's (premature ventricular contractions) cardiologist--- dr t. turner   Status post PVC ablation by Dr. Ladona Ridgel 2019 with recurrence of frequent PVCs/bigeminy;  prior pseudobradycardia r/t pvcs   Rheumatoid arthritis involving multiple sites [facility])    rheumotology--- dr a. Sharmon Revere  ([facility] in [place])    Tobacco History: Social History   Tobacco Use  Smoking Status Never  Smokeless Tobacco Never   Counseling given: Not Answered   Outpatient Medications Prior to Visit  Medication Sig Dispense Refill   acetaminophen-codeine (TYLENOL #3) 300-30 MG tablet Take 1-2 tablets by mouth as needed for moderate pain.     albuterol (VENTOLIN HFA) 108 (90 Base) MCG/ACT inhaler Inhale 2 puffs into the lungs every 4 (four) hours as needed for wheezing or shortness of breath. 8 g 2   apixaban (ELIQUIS) 5 MG TABS tablet Take 1 tablet (5 mg total) by mouth 2 (two) times daily. 60 tablet 0   atorvastatin (LIPITOR) 40 MG tablet Take 1 tablet (40 mg total) by mouth daily. 90  tablet 3   chlorthalidone (HYGROTON) 25 MG tablet Take 25 mg by mouth daily.     colchicine 0.6 MG tablet Take 1-2 tablets by mouth daily as needed (gout).      diltiazem (CARDIZEM CD) 180 MG 24 hr capsule Take 1 capsule (180 mg total) by mouth daily. 90 capsule 3   fluticasone (FLONASE) 50 MCG/ACT nasal spray Place 1 spray into both nostrils daily.     Fluticasone-Salmeterol (ADVAIR) 250-50 MCG/DOSE AEPB Inhale 1 puff into the lungs every 12 (twelve) hours. 60 each 11   folic acid (FOLVITE) 1 MG tablet Take 1 mg by mouth daily.     gabapentin (NEURONTIN) 600 MG tablet Take 600 mg by mouth 3 (three) times daily.      HUMIRA PEN 40 MG/0.4ML PNKT Inject 40 mg as directed every 14 (fourteen) days.     hydrALAZINE (APRESOLINE) 100 MG tablet Take 1 tablet (100 mg total) by mouth 3 (three) times daily.     losartan (COZAAR) 100 MG tablet Take 1 tablet (100  mg total) by mouth daily. 90 tablet 3   methotrexate 2.5 MG tablet Take 15 mg by mouth every Friday.     montelukast (SINGULAIR) 10 MG tablet Take 1 tablet (10 mg total) by mouth at bedtime. 90 tablet 0   nebivolol (BYSTOLIC) 5 MG tablet Take 2 tablets (10 mg total) by mouth at bedtime.     Potassium Chloride ER 20 MEQ TBCR Take 20 mEq by mouth in the morning and at bedtime.     predniSONE (DELTASONE) 5 MG tablet Take 5 mg by mouth daily.     sildenafil (REVATIO) 20 MG tablet Take 20 mg by mouth daily as needed (erectile dysfunction).     spironolactone (ALDACTONE) 25 MG tablet Take 0.5 tablets (12.5 mg total) by mouth daily. 30 tablet 0   No facility-administered medications prior to visit.     Review of Systems:   Constitutional:   No  weight loss, night sweats,  Fevers, chills,  +fatigue, or  lassitude.  HEENT:   No headaches,  Difficulty swallowing,  Tooth/dental problems, or  Sore throat,                No sneezing, itching, ear ache, nasal congestion, post nasal drip,   CV:  No chest pain,  Orthopnea, PND, +swelling in lower  extremities, anasarca, dizziness, palpitations, syncope.   GI  No heartburn, indigestion, abdominal pain, nausea, vomiting, diarrhea, change in bowel habits, loss of appetite, bloody stools.   Resp:    No chest wall deformity  Skin: no rash or lesions.  GU: no dysuria, change in color of urine, no urgency or frequency.  No flank pain, no hematuria   MS:  No joint pain or swelling.  No decreased range of motion.  No back pain.    Physical Exam  BP 120/80 (BP Location: Left Arm, Patient Position: Sitting, Cuff Size: Normal)   Pulse 60   Temp 98 F (36.7 C) (Oral)   Ht  (1.778 m)   Wt 250 lb 9.6 oz (113.7 kg)   SpO2 97%   BMI 35.96 kg/m   GEN: A/Ox3; pleasant , NAD, well nourished    HEENT:  [place]/AT,  , NOSE-clear, THROAT-clear, no lesions, no postnasal drip or exudate noted.   NECK:  Supple w/ fair ROM; no JVD; normal carotid impulses w/o bruits; no thyromegaly or nodules palpated; no lymphadenopathy.    RESP  Clear  P & A; w/o, wheezes/ rales/ or rhonchi. no accessory muscle use, no dullness to percussion  CARD:  RRR, no m/r/g, tr-1 peripheral edema, pulses intact, no cyanosis or clubbing.  GI:   Soft & nt; nml bowel sounds; no organomegaly or masses detected.   Musco: Warm bil, no deformities or joint swelling noted.   Neuro: alert, no focal deficits noted.    Skin: Warm, no lesions or rashes    Lab Results:  CBC    Component Value Date/Time   WBC 4.2 06/07/2021 0344   RBC 3.65 (L) 06/07/2021 1014   RBC 3.45 (L) 06/07/2021 0344   HGB 11.4 (L) 06/07/2021 0344   HGB 11.9 (L) 05/24/2021 1038   HCT 34.8 (L) 06/07/2021 0344   HCT 35.4 (L) 05/24/2021 1038   PLT 211 06/07/2021 0344   PLT 171 05/24/2021 1038   MCV 100.9 (H) 06/07/2021 0344   MCV 98 (H) 05/24/2021 1038   MCH 33.0 06/07/2021 0344   MCHC 32.8 06/07/2021 0344   RDW 15.1 06/07/2021 0344   RDW 15.3 05/24/2021  1038   LYMPHSABS 1.2 06/06/2021 1703   LYMPHSABS 0.8 02/20/2021 1215   MONOABS 0.5  06/06/2021 1703   EOSABS 0.0 06/06/2021 1703   EOSABS 0.0 02/20/2021 1215   BASOSABS 0.0 06/06/2021 1703   BASOSABS 0.0 02/20/2021 1215    BMET    Component Value Date/Time   NA 137 06/08/2021 0157   NA 141 05/24/2021 1038   K 3.9 06/08/2021 0157   CL 107 06/08/2021 0157   CO2 21 (L) 06/08/2021 0157   GLUCOSE 98 06/08/2021 0157   BUN 17 06/08/2021 0157   BUN 18 05/24/2021 1038   CREATININE 1.10 06/08/2021 0157   CALCIUM 9.3 06/08/2021 0157   GFRNONAA >60 06/08/2021 0157   GFRAA 65 02/03/2021 1515    BNP    Component Value Date/Time   BNP 74.2 06/06/2021 1703    ProBNP    Component Value Date/Time   PROBNP 47 02/06/2021 1028    Imaging: DG Chest 2 View  Result Date: 05/28/2021 CLINICAL DATA:  Dyspnea on exertion EXAM: CHEST - 2 VIEW COMPARISON:  05/24/2021 FINDINGS: Cardiac shadow is enlarged. Mild central vascular congestion is noted. No focal infiltrate or effusion is seen. No bony abnormality is noted. IMPRESSION: Changes of mild CHF. Electronically Signed   By: Alcide Clever M.D.   On: 05/28/2021 15:30   CT Angio Chest PE W and/or Wo Contrast  Result Date: 06/06/2021 CLINICAL DATA:  Hypoxia EXAM: CT ANGIOGRAPHY CHEST WITH CONTRAST TECHNIQUE: Multidetector CT imaging of the chest was performed using the standard protocol during bolus administration of intravenous contrast. Multiplanar CT image reconstructions and MIPs were obtained to evaluate the vascular anatomy. CONTRAST:  70mL OMNIPAQUE IOHEXOL 350 MG/ML SOLN COMPARISON:  CT 06/06/2021, 05/24/2021 FINDINGS: Cardiovascular: Satisfactory opacification of the pulmonary arteries to the segmental level. No evidence of pulmonary embolism. Mild cardiomegaly. Coronary vascular calcification. Ascending aortic aneurysm measuring up to 4.4 cm. No dissection is seen. Mild atherosclerosis. No pericardial effusion. Mediastinum/Nodes: Midline trachea. No thyroid mass. No suspicious adenopathy. Esophagus within normal limits.  Lungs/Pleura: Lungs are clear. No pleural effusion or pneumothorax. Upper Abdomen: No acute abnormality Musculoskeletal: No chest wall abnormality. No acute or significant osseous findings. Review of the MIP images confirms the above findings. IMPRESSION: 1. Negative for acute pulmonary embolus or aortic dissection. 2. Aneurysmal dilatation of the ascending thoracic aorta up to 4.4 cm. Recommend annual imaging followup by CTA or MRA. This recommendation follows 2010 ACCF/AHA/AATS/ACR/ASA/SCA/SCAI/SIR/STS/SVM Guidelines for the Diagnosis and Management of Patients with Thoracic Aortic Disease. Circulation. 2010; 121: O756-E332. Aortic aneurysm NOS (ICD10-I71.9) Aortic Atherosclerosis (ICD10-I70.0).Aortic aneurysm NOS (ICD10-I71.9). Electronically Signed   By: Jasmine Pang M.D.   On: 06/06/2021 21:20   CT Angio Chest PE W and/or Wo Contrast  Result Date: 06/06/2021 CLINICAL DATA:  Hypoxia. EXAM: CT ANGIOGRAPHY CHEST WITH CONTRAST TECHNIQUE: Multidetector CT imaging of the chest was performed using the standard protocol during bolus administration of intravenous contrast. Multiplanar CT image reconstructions and MIPs were obtained to evaluate the vascular anatomy. CONTRAST:  OMNIPAQUE IOHEXOL 350 MG/ML SOLN COMPARISON:  May 24, 2021. FINDINGS: Cardiovascular: There is inadequate contrast opacification of pulmonary arteries to evaluate for pulmonary embolus. Preferential filling of the thoracic aorta is noted. No dissection is noted. 4.4 cm ascending thoracic aortic aneurysm is noted. Mild cardiomegaly is noted. No pericardial effusion is noted. Mediastinum/Nodes: No enlarged mediastinal, hilar, or axillary lymph nodes. Thyroid gland, trachea, and esophagus demonstrate no significant findings. Lungs/Pleura: Lungs are clear. No pleural effusion or pneumothorax. Upper Abdomen:  No acute abnormality. Musculoskeletal: No chest wall abnormality. No acute or significant osseous findings. Review of the MIP images  confirms the above findings. IMPRESSION: Inadequate contrast opacification of pulmonary arteries is noted most likely due to timing issue, and therefore pulmonary embolus cannot be excluded on the basis of this exam. There is no evidence of thoracic aortic dissection. 4.4 cm ascending thoracic aortic aneurysm. Recommend annual imaging followup by CTA or MRA. This recommendation follows 2010 ACCF/AHA/AATS/ACR/ASA/SCA/SCAI/SIR/STS/SVM Guidelines for the Diagnosis and Management of Patients with Thoracic Aortic Disease. Circulation. 2010; 121: T614-E315. Aortic aneurysm NOS (ICD10-I71.9). Electronically Signed   By: Lupita Raider M.D.   On: 06/06/2021 18:01   CT Angio Chest Pulmonary Embolism (PE) W or WO Contrast  Addendum Date: 05/25/2021   ADDENDUM REPORT: 05/25/2021 08:31 ADDENDUM: Critical Value/emergent results were called by telephone at the time of interpretation on 05/25/2021 at 8:31 am to provider [facility] , who verbally acknowledged these results. Electronically Signed   By: Bretta Bang III M.D.   On: 05/25/2021 08:31   Addendum Date: 05/25/2021   ADDENDUM REPORT: 05/25/2021 07:49 ADDENDUM: On further review, there is a small peripheral pulmonary embolus in a lateral segment right lower lobe pulmonary artery branch. Electronically Signed   By: Bretta Bang III M.D.   On: 05/25/2021 07:49   Result Date: 05/25/2021 CLINICAL DATA:  Shortness of breath EXAM: CT ANGIOGRAPHY CHEST WITH CONTRAST TECHNIQUE: Multidetector CT imaging of the chest was performed using the standard protocol during bolus administration of intravenous contrast. Multiplanar CT image reconstructions and MIPs were obtained to evaluate the vascular anatomy. CONTRAST:  19mL OMNIPAQUE IOHEXOL 350 MG/ML SOLN COMPARISON:  CT angiogram chest November 29, 2020 FINDINGS: Cardiovascular: There is no evident pulmonary embolus. Ascending thoracic aortic diameter measures 4.4 x 4.4 cm, stable. No thoracic aortic dissection.  Measured diameter at the sinuses of Valsalva measures 4.3 cm. Measured diameter in the aortic arch measures 3.8 cm. Measured diameter of the descending aorta at the main pulmonary outflow tract measures 3.6 x 3.5 cm. Visualized great vessels appear unremarkable. There are occasional foci of aortic atherosclerosis. There is no pericardial effusion or pericardial thickening. Heart is mildly enlarged. Mediastinum/Nodes: There is a 1 cm nodular opacity in the left lobe of the thyroid, stable. There is a 4 mm nodular opacity in the right lobe of the thyroid. These findings do not warrant additional imaging surveillance per consensus guidelines. No evident thoracic adenopathy. No esophageal lesions are evident. Lungs/Pleura: There are scattered areas of atelectatic change in each lower lobe and in the posterior segment of the left upper lobe. There is no edema or consolidation. No pleural effusions. Fatty prominence extending along the posterior descending aorta is a stable finding, felt to be of benign etiology. Trachea and major bronchial structures appear patent. No pneumothorax. Upper Abdomen: Visualized upper abdominal structures appear unremarkable. Musculoskeletal: No blastic or lytic bone lesions. No evident chest wall lesions. Review of the MIP images confirms the above findings. IMPRESSION: 1.  No demonstrable pulmonary embolus. 2. Ascending thoracic aortic diameter measures 4.4 x 4.4 cm. Measured diameter in the proximal arch measures 3.8 cm. Recommend annual imaging followup by CTA or MRA. This recommendation follows 2010 ACCF/AHA/AATS/ACR/ASA/SCA/SCAI/SIR/STS/SVM Guidelines for the Diagnosis and Management of Patients with Thoracic Aortic Disease. Circulation.2010; 121: Q008-Q761. Aortic aneurysm NOS (ICD10-I71.9) no evident thoracic aortic dissection. There are foci of aortic atherosclerosis. There is cardiac enlargement. 3. No evident edema or consolidation. Scattered areas of atelectatic change. No pleural  effusions. 4.  No evident adenopathy. Aortic Atherosclerosis (ICD10-I70.0). Electronically Signed: By: Bretta Bang III M.D. On: 05/24/2021 13:35   CARDIAC CATHETERIZATION  Result Date: 05/29/2021  Prox RCA lesion is 20% stenosed.  Prox Cx to Mid Cx lesion is 20% stenosed.  Mid LAD lesion is 30% stenosed.  Mild non-obstructive CAD Normal right and left heart pressures Recommendations: Medical management of mild CAD. Will resume IV heparin 6 hours post sheath pull given acute PE. Would transition to Eliquis or Xarelto tomorrow.   CT Chest High Resolution  Result Date: 06/07/2021 CLINICAL DATA:  Interstitial lung disease EXAM: CT CHEST WITHOUT CONTRAST TECHNIQUE: Multidetector CT imaging of the chest was performed following the standard protocol without intravenous contrast. High resolution imaging of the lungs, as well as inspiratory and expiratory imaging, was performed. COMPARISON:  06/06/2021, 05/24/2021, 11/29/2020 FINDINGS: Cardiovascular: Aortic atherosclerosis. Unchanged enlargement of the tubular ascending thoracic aorta, measuring up to 4.4 x 4.4 cm. Cardiomegaly. Left and right coronary artery calcifications. No pericardial effusion. Mediastinum/Nodes: No enlarged mediastinal, hilar, or axillary lymph nodes. Thyroid gland, trachea, and esophagus demonstrate no significant findings. Lungs/Pleura: Parke Simmers appearing, bandlike scarring and or partial atelectasis of the dependent lungs, unchanged compared to prior examination. No significant air trapping on expiratory phase imaging no pleural effusion or pneumothorax. Upper Abdomen: No acute abnormality. Musculoskeletal: No chest wall mass or suspicious bone lesions identified. IMPRESSION: 1. Bland appearing, bandlike scarring and or partial atelectasis of the dependent lungs, unchanged compared to prior examination. No evidence of fibrotic interstitial lung disease. No significant air trapping on expiratory phase imaging. 2. Cardiomegaly and  coronary artery disease. 3. Unchanged enlargement of the tubular ascending thoracic aorta, measuring up to 4.4 x 4.4 cm. Recommend annual imaging followup by CTA or MRA if not otherwise imaged. This recommendation follows 2010 ACCF/AHA/AATS/ACR/ASA/SCA/SCAI/SIR/STS/SVM Guidelines for the Diagnosis and Management of Patients with Thoracic Aortic Disease. Circulation. 2010; 121: Z610-R604. Aortic aneurysm NOS (ICD10-I71.9) Aortic Atherosclerosis (ICD10-I70.0). Electronically Signed   By: Lauralyn Primes M.D.   On: 06/07/2021 15:12   ECHOCARDIOGRAM COMPLETE  Result Date: 05/26/2021    ECHOCARDIOGRAM REPORT   Patient Name:   ARAMIS WEIL Date of Exam: 05/26/2021 Medical Rec #:  [id number]      Height:       70.0 in Accession #:    [id number]     Weight:       257.4 lb Date of Birth:  [birth year]       BSA:          2.324 m Patient Age:    65 years       BP:           133/81 mmHg Patient Gender: M              HR:           77 bpm. Exam Location:  Inpatient Procedure: 2D Echo, Cardiac Doppler and Color Doppler Indications:    Pulmonary embolus  History:        Patient has prior history of Echocardiogram examinations, most                 recent 02/08/2021.  Sonographer:    Neomia Dear RDCS Referring Phys: [id number] TODD D MCDIARMID IMPRESSIONS  1. Left ventricular ejection fraction, by estimation, is 45 to 50%. The left ventricle has mildly decreased function. The left ventricle has no regional wall motion abnormalities. There is severe asymmetric left ventricular hypertrophy. Left ventricular  diastolic parameters are consistent with Grade I  diastolic dysfunction (impaired relaxation).  2. Right ventricular systolic function is moderately reduced. The right ventricular size is moderately enlarged. There is moderately elevated pulmonary artery systolic pressure.  3. Left atrial size was moderately dilated.  4. Right atrial size was mildly dilated.  5. The mitral valve is normal in structure. Mild mitral valve regurgitation.  6.  The aortic valve is normal in structure. Aortic valve regurgitation is not visualized. No aortic stenosis is present.  7. Aortic dilatation noted. There is mild dilatation of the ascending aorta, measuring 41 mm. FINDINGS  Left Ventricle: Left ventricular ejection fraction, by estimation, is 45 to 50%. The left ventricle has mildly decreased function. The left ventricle has no regional wall motion abnormalities. The left ventricular internal cavity size was normal in size. There is severe asymmetric left ventricular hypertrophy. Left ventricular diastolic parameters are consistent with Grade I diastolic dysfunction (impaired relaxation). Right Ventricle: The right ventricular size is moderately enlarged. Right vetricular wall thickness was not well visualized. Right ventricular systolic function is moderately reduced. There is moderately elevated pulmonary artery systolic pressure. The tricuspid regurgitant velocity is 3.27 m/s, and with an assumed right atrial pressure of 8 mmHg, the estimated right ventricular systolic pressure is 50.8 mmHg. Left Atrium: Left atrial size was moderately dilated. Right Atrium: Right atrial size was mildly dilated. Pericardium: There is no evidence of pericardial effusion. Mitral Valve: The mitral valve is normal in structure. Mild mitral valve regurgitation. Tricuspid Valve: The tricuspid valve is grossly normal. Tricuspid valve regurgitation is mild. Aortic Valve: The aortic valve is normal in structure. Aortic valve regurgitation is not visualized. No aortic stenosis is present. Aortic valve mean gradient measures 7.0 mmHg. Aortic valve peak gradient measures 13.1 mmHg. Aortic valve area, by VTI measures 4.24 cm. Pulmonic Valve: The pulmonic valve was normal in structure. Pulmonic valve regurgitation is not visualized. Aorta: Aortic dilatation noted. There is mild dilatation of the ascending aorta, measuring 41 mm. IAS/Shunts: The atrial septum is grossly normal.  LEFT VENTRICLE  PLAX 2D LVIDd:         6.20 cm      Diastology LVIDs:         4.60 cm      LV e' medial:  3.68 cm/s LV PW:         1.10 cm      LV e' lateral: 7.34 cm/s LV IVS:        1.70 cm LVOT diam:     2.90 cm LV SV:         141 LV SV Index:   61 LVOT Area:     6.61 cm  LV Volumes (MOD) LV vol d, MOD A2C: 100.0 ml LV vol d, MOD A4C: 142.0 ml LV vol s, MOD A2C: 46.0 ml LV vol s, MOD A4C: 80.6 ml LV SV MOD A2C:     54.0 ml LV SV MOD A4C:     142.0 ml LV SV MOD BP:      64.9 ml RIGHT VENTRICLE RV Basal diam:  6.10 cm RV Mid diam:    4.60 cm RV S prime:     20.00 cm/s TAPSE (M-mode): 4.3 cm LEFT ATRIUM              Index LA diam:        4.60 cm  1.98 cm/m LA Vol (A2C):   105.0 ml 45.19 ml/m LA Vol (A4C):   85.0 ml  36.58 ml/m LA Biplane Vol: 101.0 ml 43.47 ml/m  AORTIC VALVE AV Area (Vmax):    3.94 cm AV Area (Vmean):   4.47 cm AV Area (VTI):     4.24 cm AV Vmax:           181.00 cm/s AV Vmean:          118.000 cm/s AV VTI:            0.332 m AV Peak Grad:      13.1 mmHg AV Mean Grad:      7.0 mmHg LVOT Vmax:         108.00 cm/s LVOT Vmean:        79.900 cm/s LVOT VTI:          0.213 m LVOT/AV VTI ratio: 0.64  AORTA Ao Root diam: 4.80 cm Ao Asc diam:  4.10 cm MITRAL VALVE               TRICUSPID VALVE MV Area (PHT): 3.27 cm    TR Peak grad:   42.8 mmHg MV Decel Time: 232 msec    TR Vmax:        327.00 cm/s MV A velocity: 90.70 cm/s                            SHUNTS                            Systemic VTI:  0.21 m                            Systemic Diam: 2.90 cm Kristeen Miss MD Electronically signed by Kristeen Miss MD Signature Date/Time: 05/26/2021/5:04:48 PM    Final    VAS [place] LOWER EXTREMITY VENOUS (DVT)  Result Date: 05/26/2021  Lower Venous DVT Study Patient Name:  LAWRENCE MITCH  Date of Exam:   05/26/2021 Medical Rec #: [id number]       Accession #:    [id number] Date of Birth: February 26, 1956        Patient Gender: M Patient Age:   88Y Exam Location:  [facility] Procedure:      VAS [place] LOWER EXTREMITY VENOUS  (DVT) Referring Phys: [id number] ROBERT LOCKWOOD --------------------------------------------------------------------------------  Indications: Pulmonary embolism.  Comparison Study: No prior study Performing Technologist: Gertie Fey MHA, RDMS, RVT, RDCS  Examination Guidelines: A complete evaluation includes B-mode imaging, spectral Doppler, color Doppler, and power Doppler as needed of all accessible portions of each vessel. Bilateral testing is considered an integral part of a complete examination. Limited examinations for reoccurring indications may be performed as noted. The reflux portion of the exam is performed with the patient in reverse Trendelenburg.  +---------+---------------+---------+-----------+----------+--------------+ RIGHT    CompressibilityPhasicitySpontaneityPropertiesThrombus Aging +---------+---------------+---------+-----------+----------+--------------+ CFV      Full           Yes      Yes                                 +---------+---------------+---------+-----------+----------+--------------+ SFJ      Full                                                        +---------+---------------+---------+-----------+----------+--------------+  FV Prox  Full                                                        +---------+---------------+---------+-----------+----------+--------------+ FV Mid   Full                                                        +---------+---------------+---------+-----------+----------+--------------+ FV DistalFull                                                        +---------+---------------+---------+-----------+----------+--------------+ PFV      Full                                                        +---------+---------------+---------+-----------+----------+--------------+ POP      Full           Yes      Yes                                  +---------+---------------+---------+-----------+----------+--------------+ PTV      Full                                                        +---------+---------------+---------+-----------+----------+--------------+ PERO     Full                                                        +---------+---------------+---------+-----------+----------+--------------+   +---------+---------------+---------+-----------+----------+--------------+ LEFT     CompressibilityPhasicitySpontaneityPropertiesThrombus Aging +---------+---------------+---------+-----------+----------+--------------+ CFV      Full           Yes      Yes                                 +---------+---------------+---------+-----------+----------+--------------+ SFJ      Full                                                        +---------+---------------+---------+-----------+----------+--------------+ FV Prox  Full                                                        +---------+---------------+---------+-----------+----------+--------------+  FV Mid   Full                                                        +---------+---------------+---------+-----------+----------+--------------+ FV DistalFull                                                        +---------+---------------+---------+-----------+----------+--------------+ PFV      Full                                                        +---------+---------------+---------+-----------+----------+--------------+ POP      Full           Yes      Yes                                 +---------+---------------+---------+-----------+----------+--------------+ PTV      Full                                                        +---------+---------------+---------+-----------+----------+--------------+ PERO     Full                                                         +---------+---------------+---------+-----------+----------+--------------+     Summary: RIGHT: - There is no evidence of deep vein thrombosis in the lower extremity.  - No cystic structure found in the popliteal fossa.  LEFT: - There is no evidence of deep vein thrombosis in the lower extremity.  - No cystic structure found in the popliteal fossa.  *See table(s) above for measurements and observations. Electronically signed by Coral Else MD on 05/26/2021 at 10:23:44 PM.    Final       PFT Results Latest Ref Rng & Units 11/16/2020  FVC-Pre L 4.14  FVC-Predicted Pre % 92  FVC-Post L 4.01  FVC-Predicted Post % 89  Pre FEV1/FVC % % 82  Post FEV1/FCV % % 85  FEV1-Pre L 3.38  FEV1-Predicted Pre % 101  FEV1-Post L 3.40  DLCO uncorrected ml/min/mmHg 18.55  DLCO UNC% % 71  DLCO corrected ml/min/mmHg 18.55  DLCO COR %Predicted % 71  DLVA Predicted % 86  TLC L 7.23  TLC % Predicted % 106  RV % Predicted % 121    No results found for: NITRICOXIDE      Assessment & Plan:   Pulmonary embolism (HCC) Probable provoked PE (airplane trip -prior to admission) -no prior history of VTE. Venous Dopplers were negative for DVT Given patient's initial echo with decreased RV function and pulmonary hypertension.  Would continue on Eliquis for at least  6 months. Patient education on anticoagulation given. Follow-up right and left cath showed no significant pulmonary hypertension and normal RV and LV function. Patient continues to have ongoing fatigue shortness of breath and decreased activity tolerance.  May be out of work for an additional 4 weeks and we will reevaluate on return.  Plan  Patient Instructions  Continue on Advair 1 puff Twice daily, rinse after use.  Begin Oxygen 2l/m with activity .  Continue on Eliquis  Twice daily   Do not take NSAIDs , ibuprofen , motrin, advil, diclofenac, etc.  Follow up with cardiology as planned next month .  Continue on CPAP At bedtime  .  Follow up  with Dr. Francine Graven in 4 weeks and As needed   Please contact office for sooner follow up if symptoms do not improve or worsen or seek emergency care         OSA (obstructive sleep apnea) Continue on CPAP at bedtime.  History of COVID-19 Patient appears to be having long-haul COVID symptoms with ongoing fatigue, low activity tolerance, general malaise.  Continue with supportive care and activity as tolerated  Heart failure with mid-range ejection fraction (HCC) Follow-up left and right heart cath showed improvement in LV and RV function.  Continue with follow-up with cardiology. Appears euvolemic on exam.  Continue with current regimen  Class 2 severe obesity with serious comorbidity and body mass index (BMI) of 35.0 to 35.9 in adult (HCC) Healthy weight loss discussed  Dyspnea on exertion Dyspnea with exertion suspect is multifactorial.  Patient does have some mild desaturations with ambulation.   Over the last year patient has had COVID-19 along with COVID-pneumonia.  Previous CT does not show evidence of interstitial lung disease.  Did show some by basilar scarring. Previous PFT showed normal lung function with a slightly decreased diffusing capacity. Initial echo after PE did show some decreased RV and LV dysfunction.  However follow-up right and left heart cath showed improvement.,  Patient does have underlying obstructive sleep apnea however no pulmonary hypertension on right heart cath was noted. For now we will begin oxygen with activity.  Continue with activity as tolerated May have a component of some reactive airways for now continue on Advair.  Plan  Patient Instructions  Continue on Advair 1 puff Twice daily, rinse after use.  Begin Oxygen 2l/m with activity .  Continue on Eliquis  Twice daily   Do not take NSAIDs , ibuprofen , motrin, advil, diclofenac, etc.  Follow up with cardiology as planned next month .  Continue on CPAP At bedtime  .  Follow up with Dr. Francine Graven  in 4 weeks and As needed   Please contact office for sooner follow up if symptoms do not improve or worsen or seek emergency care       I spent  45  minutes dedicated to the care of this patient on the date of this encounter to include pre-visit review of records, face-to-face time with the patient discussing conditions above, post visit ordering of testing, clinical documentation with the electronic health record, making appropriate referrals as documented, and communicating necessary findings to members of the patients care team.     Rubye Oaks, NP 06/15/2021

## 2021-06-15 NOTE — Assessment & Plan Note (Signed)
Healthy weight loss discussed 

## 2021-06-15 NOTE — Assessment & Plan Note (Signed)
Dyspnea with exertion suspect is multifactorial.  Patient does have some mild desaturations with ambulation.   Over the last year patient has had COVID-19 along with COVID-pneumonia.  Previous CT does not show evidence of interstitial lung disease.  Did show some by basilar scarring. Previous PFT showed normal lung function with a slightly decreased diffusing capacity. Initial echo after PE did show some decreased RV and LV dysfunction.  However follow-up right and left heart cath showed improvement.,  Patient does have underlying obstructive sleep apnea however no pulmonary hypertension on right heart cath was noted. For now we will begin oxygen with activity.  Continue with activity as tolerated May have a component of some reactive airways for now continue on Advair.  Plan  Patient Instructions  Continue on Advair 1 puff Twice daily, rinse after use.  Begin Oxygen 2l/m with activity .  Continue on Eliquis 5mg  Twice daily   Do not take NSAIDs , ibuprofen , motrin, advil, diclofenac, etc.  Follow up with cardiology as planned next month .  Continue on CPAP At bedtime  .  Follow up with Dr. in 4 weeks and As needed   Please contact office for sooner follow up if symptoms do not improve or worsen or seek emergency care

## 2021-06-15 NOTE — Telephone Encounter (Signed)
Noted will call patient this weekend to follow up initiating home oxygen and extended work excuse

## 2021-06-15 NOTE — Assessment & Plan Note (Signed)
Continue on CPAP at bedtime 

## 2021-06-15 NOTE — Telephone Encounter (Signed)
Noted on chart review after pulmonology appt today that pt is being kept out of work for an additional 4 weeks until next appt 07/20/21. Letter printed and provided to HR.  Pt also being started on oxygen 2 lpm with activity.  Called pt. No answer. LVM advising HR has new letter and to call if any needs arise.  Seeing pcp this afternoon at 1500.

## 2021-06-15 NOTE — Assessment & Plan Note (Addendum)
Follow-up left and right heart cath showed improvement in LV and RV function.  Continue with follow-up with cardiology. Appears euvolemic on exam.  Continue with current regimen

## 2021-06-15 NOTE — Assessment & Plan Note (Signed)
Patient appears to be having long-haul COVID symptoms with ongoing fatigue, low activity tolerance, general malaise.  Continue with supportive care and activity as tolerated

## 2021-06-15 NOTE — Assessment & Plan Note (Signed)
Probable provoked PE (airplane trip -prior to admission) -no prior history of VTE. Venous Dopplers were negative for DVT Given patient's initial echo with decreased RV function and pulmonary hypertension.  Would continue on Eliquis for at least 6 months. Patient education on anticoagulation given. Follow-up right and left cath showed no significant pulmonary hypertension and normal RV and LV function. Patient continues to have ongoing fatigue shortness of breath and decreased activity tolerance.  May be out of work for an additional 4 weeks and we will reevaluate on return.  Plan  Patient Instructions  Continue on Advair 1 puff Twice daily, rinse after use.  Begin Oxygen 2l/m with activity .  Continue on Eliquis 5mg  Twice daily   Do not take NSAIDs , ibuprofen , motrin, advil, diclofenac, etc.  Follow up with cardiology as planned next month .  Continue on CPAP At bedtime  .  Follow up with Dr. in 4 weeks and As needed   Please contact office for sooner follow up if symptoms do not improve or worsen or seek emergency care

## 2021-06-15 NOTE — Patient Instructions (Addendum)
Continue on Advair 1 puff Twice daily, rinse after use.  Begin Oxygen 2l/m with activity .  Continue on Eliquis 5mg  Twice daily   Do not take NSAIDs , ibuprofen , motrin, advil, diclofenac, etc.  Follow up with cardiology as planned next month .  Continue on CPAP At bedtime  .  Follow up with Dr. in 4 weeks and As needed   Please contact office for sooner follow up if symptoms do not improve or worsen or seek emergency care

## 2021-06-15 NOTE — Telephone Encounter (Signed)
Call returned to patient, confirmed DOB. Made aware letter is available via mychart. Voiced understanding.   Nothing further needed at this time.

## 2021-06-17 MED ORDER — APIXABAN 5 MG PO TABS: 5.0000 mg | ORAL_TABLET | Freq: Two times a day (BID) | ORAL | 5 refills | Status: DC

## 2021-06-17 NOTE — Telephone Encounter (Signed)
Patient contacted via telephone, duration of call 5 minutes and he reported that he is still waiting for his oxygen to be delivered.  Resting and watching TV last night he noted a little short of breath and sp02 87% RA.  Better today and staying inside in the air conditioning.  Discussed with patient he should stay inside when heat index over 100 until he has his home oxygen and I don't recommend exertion/exercising at this time.  His body is still recovering from blood clot in the lungs.  Patient notified RN Rolly Salter gave HR his work excuse (printed from [employer]) on Thursday.  Discussed I would follow up with patient again tomorrow via telephone.  Patient A&Ox3 spoke full sentences without difficulty sometimes taking breath in middle of sentence.  Denied chest pain/dizziness.  He reported was noted to have more PVCs on EKG at [facility] appt 06/15/21.  Discussed with patient if hypoxic this is not uncommon as hypoxia can trigger PVCs.  Patient verbalized understanding information/instructions, agreed with plan of care and had no further questions at this time.

## 2021-06-17 NOTE — Addendum Note (Signed)
Addended by: Julio Sicks on: 06/17/2021 10:23 PM   Modules accepted: Orders

## 2021-06-18 NOTE — Telephone Encounter (Signed)
Patient contacted via telephone.  He stated feeling a little better.  Took a nap and sp02 currently 93% RA not feeling as many palpitations today.  Still waiting for his home oxygen delivery.  Patient stated going to call pulmonology office tomorrow to get information regarding company that is supposed to be delivering his supplies.  Patient reported since temperatures lower today 60s and raining he was able to go outside and enjoy the fresh air.  Patient A&Ox3 spoke full sentences without difficulty no cough/nasal sniffing or throat clearing noted during 3 minute telephone call.

## 2021-06-19 ENCOUNTER — Telehealth: Payer: Self-pay | Admitting: Adult Health

## 2021-06-19 DIAGNOSIS — J9611 Chronic respiratory failure with hypoxia: Secondary | ICD-10-CM

## 2021-06-19 NOTE — Telephone Encounter (Signed)
Called and spoke with patient. He was calling to check on the status of the O2 order from last Thursday. I informed him that unfortunately the order was never placed but I did see documentation that [facility] had wanted O2 for him.   I explained him to that I would go ahead and the place the order today. He stated that he is not currently established with a DME company. He would like to be called back as soon as the PCCs have found a company for him. I advised him that I would call him.   Order has placed.

## 2021-06-21 NOTE — Telephone Encounter (Signed)
Telephone message left for patient noted in Epic Pulmonary had to put in oxygen order and was going to contact him with delivery date once known.

## 2021-06-21 NOTE — Telephone Encounter (Signed)
Patient returned call and stated oxygen delivered yesterday.  He has noticed that by end of day his oxygen level drops into the mid 80s (85 yesterday afternoon).  After oxygen was delivered he started using it immediately.  He connected oxygen to his CPAP last night.  He used portable oxygen when going to [employer] today and worked well.  Patient reported portable oxygen only lasts 3.5 hours for a tank  Patient also stated a nurse from [employer] today called to check on him.  Discussed with patient insurance companies typically do have case managers on staff and not usual to follow up with patients who have been hospitalized more than one time in the past month.  Patient stated he doesn't  need prednisone filled from [facility] he is picking up from [employer] along with his blood thinner Rx.  Patient A&Ox3 spoke full sentences without difficulty no cough/throat clearing or nasal sniffing noted during 5 minute telephone call.

## 2021-06-21 NOTE — Telephone Encounter (Signed)
Noted in Epic Care Everywhere patient saw rheumatology and new Rx also yesterday will fill prednisone for patient from [facility] when onsite 06/22/21.

## 2021-06-22 NOTE — Telephone Encounter (Signed)
Patient contacted NP to notify her [facility] Dr Cephus Richer recommended patient see cardiologist Dr Lillard Anes at [facility] and appt scheduled for 14 Jul 2021. Patient was asking if Dr Laurene Footman had different training than his current cardiologist Dr Mayford Knife.  Discussed with patient Dr Mayford Knife graduated from medical school a couple years prior to Dr Laurene Footman and has additional certifications that are not listed on Dr Carollee Leitz [facility] staff information page.  Discussed certifications are additional training and tests to show competency in that area of study. Patient notified I have not worked with or had patients referred to Dr Laurene Footman in the past so I do not have any personal knowledge/working relationship with him.  Sometimes a second opinion can be helpful and other times it may only reinforce current plan of care.  Patient A&Ox3 spoke full sentences without difficulty no cough/nasal congestion/throat clearing/wheezing noted during 11 minute telephone call.

## 2021-06-27 ENCOUNTER — Telehealth: Payer: Self-pay | Admitting: *Deleted

## 2021-06-27 NOTE — Telephone Encounter (Signed)
Received paperwork from [facility], paperwork given to Daralyn.

## 2021-06-28 NOTE — Progress Notes (Signed)
Cardiology Office Note    Date:  07/11/2021   ID:  Ryan Glass, Ryan Glass Aug 29, 1956, MRN [id number]   PCP:  [name], MD   [facility]  Cardiologist:  [name], MD  Advanced Practice Provider:  No care team member to display Electrophysiologist:  [name], MD   [phone]   No chief complaint on file.   History of Present Illness:  Ryan Glass is a 65 y.o. male  with a hx of PVCs s/p PVC ablation by Dr. 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  He was found to have an acute PE with RV failure mildly reduced LVEF 45 to 50%.  Right and left heart cath 05/29/2021 20% proximal RCA, 20% proximal to mid circumflex, 30% mid LAD normal right and left heart pressures.  His shortness of breath was felt to be out of proportion to his very small PE.  Dr. Mayford Knife reviewed the echo images and felt his RV was mildly dilated but RV EF is normal and LVEF is normal.  She recommended pulmonary consult and restarted diltiazem 180 mg daily and stop Lasix because he was not fluid overloaded also restarted chlorthalidone 25 mg daily for hypertension.  Patient comes in for f/u. Still complains of DOE with little activity. He's not using his O2 when walking. Gets sleepy driving but not wearing O2. Says HR down to 35-40s at times. Saw Pulmonary and put on Oxygen although he didn't bring it today. PCP EKG bradycardia and frequent PVC's. Complains of leg swelling. Eats out 3 times a week, usually fast food. 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   Past Medical History:  Diagnosis Date   Ascending aortic aneurysm (HCC)    a. CT 05/2021 showed 4.4x4.4cm TAA.    Bilateral lower extremity edema    Chronic gout without tophus    followed by dr Sharmon Revere    (06-08-2020  per pt last episode right knee 3 wks ago)   CKD (chronic kidney disease), stage II    nephrology--- [place] [place] [facility] (10-29-2019 note in epic scanned in  media)   Heart failure with mid-range ejection fraction [facility]) 05/27/2021   05/26/21: Left ventricular ejection fraction, by estimation, is 45 to 50%. There is severe asymmetric left ventricular hypertrophy. G1DD present.    History of COVID-19 06/07/2021   Hypertension    followed by cardiology, dr t. turner   (05-14-2018 nuclear study in epic , normal perfusion with nuclear ef 61%)   Left hydrocele    Low serum potassium level 04/27/2014   OSA on CPAP    per pt uses every night   Palpitations followed by dr t. Mayford Knife   06-08-2020  still feels palipations due to PVCs when exertion but not with chest pain/ discomfort   Pneumonia due to COVID-19 virus    PVC's (premature ventricular contractions) cardiologist--- dr t. turner   Status post PVC ablation by Dr. Ladona Ridgel 2019 with recurrence of frequent PVCs/bigeminy;  prior pseudobradycardia r/t pvcs   Rheumatoid arthritis involving multiple sites [facility])    rheumotology--- dr a. Sharmon Revere  ([facility] in [place])    Past Surgical History:  Procedure Laterality Date  HYDROCELE EXCISION Left 06/14/2020   Procedure: LEFT  HYDROCELECTOMY ADULT;  Surgeon: Noel Christmas, MD;  Location: [facility];  Service: Urology;  Laterality: Left;   INCISIONAL HERNIA REPAIR  02-23-2016      LAPAROSCOPIC   LAPAROSCOPIC INGUINAL HERNIA REPAIR Bilateral 08-22-2015     AND UMBILICAL HERNIA REPAIR   PVC ABLATION N/A 10/07/2018   Procedure: PVC ABLATION;  Surgeon: Marinus Maw, MD;  Location: [facility] INVAS[facility];  Service: Cardiovascular;  Laterality: N/A;   RIGHT/LEFT HEART CATH AND CORONARY ANGIOGRAPHY N/A 05/29/2021   Procedure: RIGHT/LEFT HEART CATH AND CORONARY ANGIOGRAPHY;  Surgeon:  Kathleene Hazel, MD;  Location: [facility] INVASIVE CV LAB;  Service: Cardiovascular;  Laterality: N/A;   UMBILICAL HERNIA REPAIR  child    Current Medications: Current Meds  Medication Sig   acetaminophen-codeine (TYLENOL #3) 300-30 MG tablet Take 1-2 tablets by mouth as needed for moderate pain.   albuterol (VENTOLIN HFA) 108 (90 Base) MCG/ACT inhaler Inhale 2 puffs into the lungs every 4 (four) hours as needed for wheezing or shortness of breath.   apixaban (ELIQUIS) 5 MG TABS tablet Take 1 tablet (5 mg total) by mouth 2 (two) times daily.   atorvastatin (LIPITOR) 40 MG tablet Take 1 tablet (40 mg total) by mouth daily.   chlorthalidone (HYGROTON) 25 MG tablet Take 25 mg by mouth daily.   colchicine 0.6 MG tablet Take 1-2 tablets by mouth daily as needed (gout).    diltiazem (CARDIZEM CD) 180 MG 24 hr capsule Take 1 capsule (180 mg total) by mouth daily.   fluticasone (FLONASE) 50 MCG/ACT nasal spray Place 1 spray into both nostrils daily.   Fluticasone-Salmeterol (ADVAIR) 250-50 MCG/DOSE AEPB Inhale 1 puff into the lungs every 12 (twelve) hours.   folic acid (FOLVITE) 1 MG tablet Take 1 mg by mouth daily.   furosemide (LASIX) 40 MG tablet Take 1 tablet (40 mg total) by mouth as needed. Take 1 tablet by mouth daily for 3 days, then decrease to only as needed for increase weight gain   gabapentin (NEURONTIN) 600 MG tablet Take 600 mg by mouth 3 (three) times daily.    HUMIRA PEN 40 MG/0.4ML PNKT Inject 40 mg as directed every 14 (fourteen) days.   hydrALAZINE (APRESOLINE) 100 MG tablet Take 1 tablet (100 mg total) by mouth 3 (three) times daily.   losartan (COZAAR) 100 MG tablet Take 1 tablet (100 mg total) by mouth daily.   methotrexate 2.5 MG tablet Take 15 mg by mouth every Friday.   montelukast (SINGULAIR) 10 MG tablet Take 1 tablet (10 mg total) by mouth at bedtime.   nebivolol (BYSTOLIC) 5 MG tablet Take 2 tablets (10 mg total) by mouth at bedtime.   Potassium Chloride ER 20 MEQ TBCR  Take 20 mEq by mouth in the morning and at bedtime.   predniSONE (DELTASONE) 5 MG tablet Take 5 mg by mouth daily.   sildenafil (REVATIO) 20 MG tablet Take 20 mg by mouth daily as needed (erectile dysfunction).   spironolactone (ALDACTONE) 25 MG tablet Take 0.5 tablets (12.5 mg total) by mouth daily.     Allergies:   Amlodipine besylate and Aspirin   Social History   Socioeconomic History   Marital status: Married    Spouse name: Not on file   Number of children: Not on file   Years of education: Not on file   Highest education level: Not on file  Occupational History   Not on file  Tobacco Use   Smoking status: Never   Smokeless tobacco: Never  Vaping Use   Vaping Use: Never used  Substance and Sexual Activity   Alcohol use: Yes    Alcohol/week: 5.0 - 7.0 standard drinks    Types: 5 - 7 Cans of beer per week   Drug use: Never   Sexual activity: Not on file  Other Topics Concern   Not on file  Social History Narrative   Not on file   Social Determinants of Health   Financial Resource Strain: Not on file  Food Insecurity: Not on file  Transportation Needs: Not on file  Physical Activity: Not on file  Stress: Not on file  Social Connections: Not on file     Family History:  The patient's  family history includes Cardiomyopathy in his mother; Heart attack in his father.   ROS:   Please see the history of present illness.    ROS All other systems reviewed and are negative.   PHYSICAL EXAM:   VS:  BP 134/90   Pulse (!) 55   Ht  (1.778 m)   Wt 255 lb (115.7 kg)   SpO2 94%   BMI 36.59 kg/m   Physical Exam  GEN: Well nourished, well developed, in no acute distress  HEENT: normal  Neck: no JVD, carotid bruits, or masses Cardiac:RRR; no murmurs, rubs, or gallops  Respiratory: Decreased breath sounds but clear to auscultation bilaterally, normal work of breathing GI: soft, nontender, nondistended, + BS Ext: 1-2 edema bilaterally Neuro:  Alert and Oriented  x 3, Strength and sensation are intact Psych: euthymic mood, full affect  Wt Readings from Last 3 Encounters:  07/11/21 255 lb (115.7 kg)  06/15/21 250 lb 9.6 oz (113.7 kg)  06/08/21 246 lb 11.2 oz (111.9 kg)      Studies/Labs Reviewed:   EKG:  EKG is not ordered today.     Recent Labs: 02/06/2021: NT-Pro BNP 47 06/06/2021: ALT 29; B Natriuretic Peptide 74.2; Magnesium 1.9; TSH 0.975 06/07/2021: Hemoglobin 11.4; Platelets 211 06/08/2021: BUN 17; Creatinine, Ser 1.10; Potassium 3.9; Sodium 137   Lipid Panel    Component Value Date/Time   CHOL 123 05/27/2021 0138   CHOL 136 02/20/2021 1215   TRIG 67 05/27/2021 0138   HDL 49 05/27/2021 0138   HDL 64 02/20/2021 1215   CHOLHDL 2.5 05/27/2021 0138   VLDL 13 05/27/2021 0138   LDLCALC 61 05/27/2021 0138   LDLCALC 60 02/20/2021 1215    Additional studies/ records that were reviewed today include:  Cardiac Cath 05/29/2021 reviewed as below:   Conclusion     Prox RCA lesion is 20% stenosed. Prox Cx to Mid Cx lesion is 20% stenosed. Mid LAD lesion is 30% stenosed.   Mild non-obstructive CAD Normal right and left heart pressures   Recommendations: Medical management of mild CAD. Will resume IV heparin 6 hours post sheath pull given acute PE. Would transition to Eliquis or Xarelto tomorrow.   Echo 05/26/21 IMPRESSIONS     1. Left ventricular ejection fraction, by estimation, is 45 to 50%. The  left ventricle has mildly decreased function. The left ventricle has no  regional wall motion abnormalities. There is severe asymmetric left  ventricular hypertrophy. Left ventricular   diastolic parameters are consistent with Grade I diastolic dysfunction  (impaired relaxation).   2. Right ventricular systolic function is moderately reduced. The right  ventricular size is moderately enlarged. There is moderately elevated  pulmonary artery systolic pressure.  3. Left atrial size was moderately dilated.   4. Right atrial size was  mildly dilated.   5. The mitral valve is normal in structure. Mild mitral valve  regurgitation.   6. The aortic valve is normal in structure. Aortic valve regurgitation is  not visualized. No aortic stenosis is present.   7. Aortic dilatation noted. There is mild dilatation of the ascending  aorta, measuring 41 mm.    CT 06/07/21 IMPRESSION: 1. Bland appearing, bandlike scarring and or partial atelectasis of the dependent lungs, unchanged compared to prior examination. No evidence of fibrotic interstitial lung disease. No significant air trapping on expiratory phase imaging. 2. Cardiomegaly and coronary artery disease. 3. Unchanged enlargement of the tubular ascending thoracic aorta, measuring up to 4.4 x 4.4 cm. Recommend annual imaging followup by CTA or MRA if not otherwise imaged. This recommendation follows 2010 ACCF/AHA/AATS/ACR/ASA/SCA/SCAI/SIR/STS/SVM Guidelines for the Diagnosis and Management of Patients with Thoracic Aortic Disease. Circulation. 2010; 121: K025-K270. Aortic aneurysm NOS (ICD10-I71.9)   Aortic Atherosclerosis (ICD10-I70.0).     Electronically Signed   By: Lauralyn Primes M.D.   On: 06/07/2021 15:12  Risk Assessment/Calculations:         ASSESSMENT:    1. Other acute pulmonary embolism, unspecified whether acute cor pulmonale present (HCC)   2. Coronary artery disease involving native coronary artery of native heart without angina pectoris   3. Essential hypertension   4. PVC's (premature ventricular contractions)   5. OSA (obstructive sleep apnea)   6. Thoracic aortic aneurysm without rupture (HCC)      PLAN:  In order of problems listed above:  Pulmonary embolism on Eliquis  CAD minimal on cath, R/L heart pressures normal. DOE out of proportion to CAD.  Echo shows EF to be 45 to 50% but reviewed by Dr. Mayford Knife felt RV and LV were normal. referred to pulmonary  Dyspnea on exertion/COPD placed on home O2 by pulmonary for O2 sats in the 80s  with walking.  Patient does not always uses oxygen and continues to complain of dyspnea on exertion.  Told him he needs to wear his oxygen with walking.  HTN on diltiazem and chlorthalidone blood pressure up today.  Eating a high salt diet.  2 g sodium diet.  Lower extremity edema suspect secondary to high salt diet.  Does have grade 1 DD on echo.  Takes Lasix as needed. Leg swelling today. Should take for 3 days then prn.  Freq PVC's previous ablation with recurrence and history of pseudo bradycardia related to PVCs.  His continues to complain of heart rates in the 30s and 40s I suspect this is due to the PVCs.  We will place 1 week monitor to assess since restarted on Cardizem and Bystolic.  OSA on CPAP  Thoracic aortic aneurysm 4.4 cm on CT angio 06/06/2021  Shared Decision Making/Informed Consent        Medication Adjustments/Labs and Tests Ordered: Current medicines are reviewed at length with the patient today.  Concerns regarding medicines are outlined above.  Medication changes, Labs and Tests ordered today are listed in the Patient Instructions below. Patient Instructions  Medication Instructions:  Start Furosemide (Lasix) 40 mg for 3 days, then decrease to only as needed for increase weight gain.   *If you need a refill on your cardiac medications before your next appointment, please call your pharmacy*   Lab Work: None ordered   If you have labs (blood work) drawn today and your tests are completely normal, you will  receive your results only by: MyChart Message (if you have MyChart) OR A paper copy in the mail If you have any lab test that is abnormal or we need to change your treatment, we will call you to review the results.   Testing/Procedures: Your physician has ordered for you to wear a Zio monitor for 7 days    Follow-Up: At [facility], you and your health needs are our priority.  As part of our continuing mission to provide you with exceptional heart care,  we have created designated Provider Care Teams.  These Care Teams include your primary Cardiologist (physician) and Advanced Practice Providers (APPs -  Physician Assistants and Nurse Practitioners) who all work together to provide you with the care you need, when you need it.  We recommend signing up for the patient portal called "MyChart".  Sign up information is provided on this After Visit Summary.  MyChart is used to connect with patients for Virtual Visits (Telemedicine).  Patients are able to view lab/test results, encounter notes, upcoming appointments, etc.  Non-urgent messages can be sent to your provider as well.   To learn more about what you can do with MyChart, go to [email or web address].    Your next appointment:   2 month(s)  The format for your next appointment:   In Person  Provider:   You may see [name], MD or one of the following Advanced Practice Providers on your designated Care Team:   Ronie Spies, PA-C Jacolyn Reedy, PA-C   Other Instructions PLEASE USE YOUR OXYGEN TO HELP WITH YOUR SHORTNESS OF BREATH.   ZIO XT- Long Term Monitor Instructions  Your physician has requested you wear a ZIO patch monitor for 7 days.  This is a single patch monitor. [employer] supplies one patch monitor per enrollment. Additional stickers are not available. Please do not apply patch if you will be having a Nuclear Stress Test,  Echocardiogram, Cardiac CT, MRI, or Chest Xray during the period you would be wearing the  monitor. The patch cannot be worn during these tests. You cannot remove and re-apply the  ZIO XT patch monitor.  Your ZIO patch monitor will be mailed 3 day USPS to your address on file. It may take 3-5 days  to receive your monitor after you have been enrolled.  Once you have received your monitor, please review the enclosed instructions. Your monitor has already been registered assigning a specific monitor serial # to you.  Billing and Patient Assistance Program  Information  We have supplied [employer] with any of your insurance information on file for billing purposes. [employer] offers a sliding scale Patient Assistance Program for patients that do not have  insurance, or whose insurance does not completely cover the cost of the ZIO monitor.  You must apply for the Patient Assistance Program to qualify for this discounted rate.  To apply, please call [employer] at [phone], select option 4, select option 2, ask to apply for  Patient Assistance Program. Meredeth Ide will ask your household income, and how many people  are in your household. They will quote your out-of-pocket cost based on that information.  [employer] will also be able to set up a 24-month, interest-free payment plan if needed.  Applying the monitor   Shave hair from upper left chest.  Hold abrader disc by orange tab. Rub abrader in 40 strokes over the upper left chest as  indicated in your monitor instructions.  Clean area with 4 enclosed alcohol pads. Let dry.  Apply patch as indicated in monitor instructions. Patch will be placed under collarbone on left  side of chest with arrow pointing upward.  Rub patch adhesive wings for 2 minutes. Remove white label marked "1". Remove the white  label marked "2". Rub patch adhesive wings for 2 additional minutes.  While looking in a mirror, press and release button in center of patch. A small green light will  flash 3-4 times. This will be your only indicator that the monitor has been turned on.  Do not shower for the first 24 hours. You may shower after the first 24 hours.  Press the button if you feel a symptom. You will hear a small click. Record Date, Time and  Symptom in the Patient Logbook.  When you are ready to remove the patch, follow instructions on the last 2 pages of Patient  Logbook. Stick patch monitor onto the last page of Patient Logbook.  Place Patient Logbook in the blue and white box. Use locking tab on box and tape box closed   securely. The blue and white box has prepaid postage on it. Please place it in the mailbox as  soon as possible. Your physician should have your test results approximately 7 days after the  monitor has been mailed back to [facility].  Call [facility] Customer Care at [phone] if you have questions regarding  your ZIO XT patch monitor. Call them immediately if you see an orange light blinking on your  monitor.  If your monitor falls off in less than 4 days, contact our Monitor department at [phone].  If your monitor becomes loose or falls off after 4 days call [employer] at [phone] for  suggestions on securing your monitor  Two Gram Sodium Diet 2000 mg  What is Sodium? Sodium is a mineral found naturally in many foods. The most significant source of sodium in the diet is table salt, which is about 40% sodium.  Processed, convenience, and preserved foods also contain a large amount of sodium.  The body needs only 500 mg of sodium daily to function,  A normal diet provides more than enough sodium even if you do not use salt.  Why Limit Sodium? A build up of sodium in the body can cause thirst, increased blood pressure, shortness of breath, and water retention.  Decreasing sodium in the diet can reduce edema and risk of heart attack or stroke associated with high blood pressure.  Keep in mind that there are many other factors involved in these health problems.  Heredity, obesity, lack of exercise, cigarette smoking, stress and what you eat all play a role.  General Guidelines: Do not add salt at the table or in cooking.  One teaspoon of salt contains over 2 grams of sodium. Read food labels Avoid processed and convenience foods Ask your dietitian before eating any foods not dicussed in the menu planning guidelines Consult your physician if you wish to use a salt substitute or a sodium containing medication such as antacids.  Limit milk and milk products to 16 oz (2 cups) per  day.  Shopping Hints: READ LABELS!! "Dietetic" does not necessarily mean low sodium. Salt and other sodium ingredients are often added to foods during processing.    Menu Planning Guidelines Food Group Choose More Often Avoid  Beverages (see also the milk group All fruit juices, low-sodium, salt-free vegetables juices, low-sodium carbonated beverages Regular vegetable or tomato juices, commercially softened water used for drinking or cooking  Breads and Cereals Enriched  white, wheat, rye and pumpernickel bread, hard rolls and dinner rolls; muffins, cornbread and waffles; most dry cereals, cooked cereal without added salt; unsalted crackers and breadsticks; low sodium or homemade bread crumbs Bread, rolls and crackers with salted tops; quick breads; instant hot cereals; pancakes; commercial bread stuffing; self-rising flower and biscuit mixes; regular bread crumbs or cracker crumbs  Desserts and Sweets Desserts and sweets mad with mild should be within allowance Instant pudding mixes and cake mixes  Fats Butter or margarine; vegetable oils; unsalted salad dressings, regular salad dressings limited to 1 Tbs; light, sour and heavy cream Regular salad dressings containing bacon fat, bacon bits, and salt pork; snack dips made with instant soup mixes or processed cheese; salted nuts  Fruits Most fresh, frozen and canned fruits Fruits processed with salt or sodium-containing ingredient (some dried fruits are processed with sodium sulfites        Vegetables Fresh, frozen vegetables and low- sodium canned vegetables Regular canned vegetables, sauerkraut, pickled vegetables, and others prepared in brine; frozen vegetables in sauces; vegetables seasoned with ham, bacon or salt pork  Condiments, Sauces, Miscellaneous  Salt substitute with physician's approval; pepper, herbs, spices; vinegar, lemon or lime juice; hot pepper sauce; garlic powder, onion powder, low sodium soy sauce (1 Tbs.); low sodium  condiments (ketchup, chili sauce, mustard) in limited amounts (1 tsp.) fresh ground horseradish; unsalted tortilla chips, pretzels, potato chips, popcorn, salsa (1/4 cup) Any seasoning made with salt including garlic salt, celery salt, onion salt, and seasoned salt; sea salt, rock salt, kosher salt; meat tenderizers; monosodium glutamate; mustard, regular soy sauce, barbecue, sauce, chili sauce, teriyaki sauce, steak sauce, Worcestershire sauce, and most flavored vinegars; canned gravy and mixes; regular condiments; salted snack foods, olives, picles, relish, horseradish sauce, catsup   Food preparation: Try these seasonings Meats:    Pork Sage, onion Serve with applesauce  Chicken Poultry seasoning, thyme, parsley Serve with cranberry sauce  Lamb Curry powder, rosemary, garlic, thyme Serve with mint sauce or jelly  Veal Marjoram, basil Serve with current jelly, cranberry sauce  Beef Pepper, bay leaf Serve with dry mustard, unsalted chive butter  Fish Bay leaf, dill Serve with unsalted lemon butter, unsalted parsley butter  Vegetables:    Asparagus Lemon juice   Broccoli Lemon juice   Carrots Mustard dressing parsley, mint, nutmeg, glazed with unsalted butter and sugar   Green beans Marjoram, lemon juice, nutmeg,dill seed   Tomatoes Basil, marjoram, onion   Spice /blend for [employer]" 4 tsp ground thyme 1 tsp ground sage 3 tsp ground rosemary 4 tsp ground marjoram   Test your knowledge A product that says "Salt Free" may still contain sodium. True or False Garlic Powder and Hot Pepper Sauce an be used as alternative seasonings.True or False Processed foods have more sodium than fresh foods.  True or False Canned Vegetables have less sodium than froze True or False   WAYS TO DECREASE YOUR SODIUM INTAKE Avoid the use of added salt in cooking and at the table.  Table salt (and other prepared seasonings which contain salt) is probably one of the greatest sources of sodium in the diet.   Unsalted foods can gain flavor from the sweet, sour, and butter taste sensations of herbs and spices.  Instead of using salt for seasoning, try the following seasonings with the foods listed.  Remember: how you use them to enhance natural food flavors is limited only by your creativity... Allspice-Meat, fish, eggs, fruit, peas, red and yellow vegetables Almond Extract-Fruit  baked goods Anise Seed-Sweet breads, fruit, carrots, beets, cottage cheese, cookies (tastes like licorice) Basil-Meat, fish, eggs, vegetables, rice, vegetables salads, soups, sauces Bay Leaf-Meat, fish, stews, poultry Burnet-Salad, vegetables (cucumber-like flavor) Caraway Seed-Bread, cookies, cottage cheese, meat, vegetables, cheese, rice Cardamon-Baked goods, fruit, soups Celery Powder or seed-Salads, salad dressings, sauces, meatloaf, soup, bread.Do not use  celery salt Chervil-Meats, salads, fish, eggs, vegetables, cottage cheese (parsley-like flavor) Chili Power-Meatloaf, chicken cheese, corn, eggplant, egg dishes Chives-Salads cottage cheese, egg dishes, soups, vegetables, sauces Cilantro-Salsa, casseroles Cinnamon-Baked goods, fruit, pork, lamb, chicken, carrots Cloves-Fruit, baked goods, fish, pot roast, green beans, beets, carrots Coriander-Pastry, cookies, meat, salads, cheese (lemon-orange flavor) Cumin-Meatloaf, fish,cheese, eggs, cabbage,fruit pie (caraway flavor) [employer], fruit, eggs, fish, poultry, cottage cheese, vegetables Dill Seed-Meat, cottage cheese, poultry, vegetables, fish, salads, bread Fennel Seed-Bread, cookies, apples, pork, eggs, fish, beets, cabbage, cheese, Licorice-like flavor Garlic-(buds or powder) Salads, meat, poultry, fish, bread, butter, vegetables, potatoes.Do not  use garlic salt Ginger-Fruit, vegetables, baked goods, meat, fish, poultry Horseradish Root-Meet, vegetables, butter Lemon Juice or Extract-Vegetables, fruit, tea, baked goods, fish salads Mace-Baked goods fruit,  vegetables, fish, poultry (taste like nutmeg) Maple Extract-Syrups Marjoram-Meat, chicken, fish, vegetables, breads, green salads (taste like Sage) Mint-Tea, lamb, sherbet, vegetables, desserts, carrots, cabbage Mustard, Dry or Seed-Cheese, eggs, meats, vegetables, poultry Nutmeg-Baked goods, fruit, chicken, eggs, vegetables, desserts Onion Powder-Meat, fish, poultry, vegetables, cheese, eggs, bread, rice salads (Do not use   Onion salt) Orange Extract-Desserts, baked goods Oregano-Pasta, eggs, cheese, onions, pork, lamb, fish, chicken, vegetables, green salads Paprika-Meat, fish, poultry, eggs, cheese, vegetables Parsley Flakes-Butter, vegetables, meat fish, poultry, eggs, bread, salads (certain forms may   Contain sodium Pepper-Meat fish, poultry, vegetables, eggs Peppermint Extract-Desserts, baked goods Poppy Seed-Eggs, bread, cheese, fruit dressings, baked goods, noodles, vegetables, cottage  [employer], poultry, meat, fish, cauliflower, turnips,eggs bread Saffron-Rice, bread, veal, chicken, fish, eggs Sage-Meat, fish, poultry, onions, eggplant, tomateos, pork, stews Savory-Eggs, salads, poultry, meat, rice, vegetables, soups, pork Tarragon-Meat, poultry, fish, eggs, butter, vegetables (licorice-like flavor)  Thyme-Meat, poultry, fish, eggs, vegetables, (clover-like flavor), sauces, soups Tumeric-Salads, butter, eggs, fish, rice, vegetables (saffron-like flavor) Vanilla Extract-Baked goods, candy Vinegar-Salads, vegetables, meat marinades Walnut Extract-baked goods, candy   2. Choose your Foods Wisely   The following is a list of foods to avoid which are high in sodium:  Meats-Avoid all smoked, canned, salt cured, dried and kosher meat and fish as well as Anchovies   [employer] meats:Bologna, Liverwurst, Pastrami Canned meat or fish  Marinated herring Caviar    Pepperoni Corned Beef   Pizza Dried chipped  beef  Salami Frozen breaded fish or meat Salt pork Frankfurters or hot dogs  Sardines Gefilte fish   Sausage Ham (boiled ham, Proscuitto Smoked butt    spiced ham)   Spam      TV Dinners Vegetables Canned vegetables (Regular) Relish Canned mushrooms  Sauerkraut Olives    Tomato juice Pickles  Bakery and Dessert Products Canned puddings  Cream pies Cheesecake   Decorated cakes Cookies  Beverages/Juices Tomato juice, regular  Gatorade   V-8 vegetable juice, regular  Breads and Cereals Biscuit mixes   Salted potato chips, corn chips, pretzels Bread stuffing mixes  Salted crackers and rolls Pancake and waffle mixes Self-rising flour  Seasonings Accent    Meat sauces Barbecue sauce  Meat tenderizer Catsup    Monosodium glutamate (MSG) Celery salt   Onion salt Chili sauce   Prepared mustard Garlic salt   Salt, seasoned salt, sea  salt Gravy mixes   Soy sauce Horseradish   Steak sauce Ketchup   Tartar sauce Lite salt    Teriyaki sauce Marinade mixes   Worcestershire sauce  Others Baking powder   Cocoa and cocoa mixes Baking soda   Commercial casserole mixes Candy-caramels, chocolate  Dehydrated soups    Bars, fudge,nougats  Instant rice and pasta mixes Canned broth or soup  Maraschino cherries Cheese, aged and processed cheese and cheese spreads  Learning Assessment Quiz  Indicated T (for True) or F (for False) for each of the following statements:  _____ Fresh fruits and vegetables and unprocessed grains are generally low in sodium _____ Water may contain a considerable amount of sodium, depending on the source _____ You can always tell if a food is high in sodium by tasting it _____ Certain laxatives my be high in sodium and should be avoided unless prescribed   by a physician or pharmacist _____ Salt substitutes may be used freely by anyone on a sodium restricted diet _____ Sodium is present in table salt, food additives and as a natural component of   most  foods _____ Table salt is approximately 90% sodium _____ Limiting sodium intake may help prevent excess fluid accumulation in the body _____ On a sodium-restricted diet, seasonings such as bouillon soy sauce, and    cooking wine should be used in place of table salt _____ On an ingredient list, a product which lists monosodium glutamate as the first   ingredient is an appropriate food to include on a low sodium diet  Circle the best answer(s) to the following statements (Hint: there may be more than one correct answer)  11. On a low-sodium diet, some acceptable snack items are:    A. Olives  F. Bean dip   K. Grapefruit juice    B. Salted Pretzels G. Commercial Popcorn   L. Canned peaches    C. Carrot Sticks  H. Bouillon   M. Unsalted nuts   D. [place] fries  I. Peanut butter crackers N. Salami   E. Sweet pickles J. Tomato Juice   O. Pizza  12.  Seasonings that may be used freely on a reduced - sodium diet include   A. Lemon wedges F.Monosodium glutamate K. Celery seed    B.Soysauce   G. Pepper   L. Mustard powder   C. Sea salt  H. Cooking wine  M. Onion flakes   D. Vinegar  E. Prepared horseradish N. Salsa   E. Sage   J. Worcestershire sauce  O. [address]      Elson Clan, PA-C  07/11/2021 11:39 AM    [facility] [address] [place], [place], [place]  [address] Phone: [phone]; Fax: [phone]

## 2021-06-28 NOTE — Telephone Encounter (Signed)
Emailed attending physician initial report to Marshell Levan on 7/18 and attending physician progress report on 7/20 for completion.

## 2021-06-28 NOTE — Telephone Encounter (Signed)
Patient contacted via telephone stated took first dose of increased methotrexate 7 pills on Saturday 06/24/21 and patient reported rheumatology also increased prednisone back to 10mg  after was decreased while hospitalized.  Patient reported knee pain was worse on prednisone 5mg .  Patient doesn't think humira and prednisone working well for arthritis.  Diclofenac helped more but had to stop once started on blood thinner.  Patient also reported the shot he received in the butt did not help the pain either.  Patient has follow up with Dr cardiology 07/11/21 at 1115, 5 Aug with [facility] and 8/11 with pulmonology.  Patient going to keep [facility] appt for second opinion cardiology after talking with wife and his three adult daughters.  Patient reported in the afternoon goes to park with spouse and dog and sp02 decreasing to 83-85% room air.  [employer] the other day shopping short trip and when returned sp02 85% room air.  Patient using oxygen at night when sleeping with CPAP and if afternoon nap.  Uses if he goes to [employer] but not to restaurant to eat or walking around his house.  Typically if staying in house 92-96% sp02 RA.  He has noticed sp02 always lower afternoon and evenings even if he doesn't leave the house.  He reported his spouse is not allowing him to drive until his oxygen levels back above 90%.  Patient reported he did discuss with rheumatology if his breathing problems could be related to humira/methotrexate/other medicine he takes and providers in hospital/rheumatology did not think so.  His heart rate sometimes low 35-45.  His blood pressure is under better control now per patient.  Discussed post covid, medications, blood clot all could be causing his symptoms.  Further follow up/testing indicated.  Patient frustrated as cardiology states lung problem and pulmonology states heart problem and no one has an answer for him.  Reiterated with patient to stay inside when heat index over 100 when  possible as humidity/heat will make it harder for him to breath.  Discussed with patient to wear his oxygen when sp02 falls below 90.  Spoke with patient 12 minutes full sentences no cough/throat clearing or nasal congestion A&OX3  Patient verbalized understanding information/instructions, agreed with plan of care and had no further questions at this time.

## 2021-07-01 ENCOUNTER — Telehealth: Payer: Self-pay | Admitting: Adult Health

## 2021-07-01 NOTE — Telephone Encounter (Signed)
Rec'd disability forms - prepared forms for Ryan Glass's signature. - pr

## 2021-07-02 NOTE — Telephone Encounter (Signed)
Patient contacted via phone stated symptoms similar to last check in sp02 decreasing in afternoons/worsening dyspnea when outside in heat (when heat index over 100).  Patient reported best sp02 readings 94-95 RA but most around 90 without oxygen.  Still sleeping with O2 home and currently wearing as just came in from outside.  Patient reported left foot no swelling but right foot has been swollen for over a week.  Palpitations his usual notices more when resting/getting ready to sleep but doesn't notice when active during day.  Heart has been decreased in the 30s again.  Discussed with patient bring log to cardiology so provider can see trend as heart rate trending low again and can worsen PVCs may need medication adjustment again.  Sometimes he has been going up the stairs and not getting short of breath now.  But yesterday was not one of those days.  He feels that overall he is feeling a little better.  He wishes improvement would be faster.  A&Ox3 spoke full sentences during 6 minute telephone call no congestion/throat clearing or coughing.  Patient to keep his upcoming follow up appts with cardiology/pulmonology and bring BP/sp02/heart rate log with him.  Patient verbalized understanding information/instructions, agreed with plan of care and had no further questions at this time.

## 2021-07-04 ENCOUNTER — Telehealth: Payer: Self-pay | Admitting: Adult Health

## 2021-07-05 NOTE — Telephone Encounter (Signed)
Called and spoke to pt. Pt states his disability forms have still not be returned to [employer] yet with the date of return (8/12). Per phone note 7/23 TP is needing to sign the forms to be returned. Pt states the deadline is approaching and voiced this be rushed.   Will forward message to [employer].

## 2021-07-09 NOTE — Telephone Encounter (Signed)
Weekly follow up with patient attempted no answer left message.  Noted patient still waiting for pulmonary to complete [employer] Disability paperwork that was received in office 06/27/21 per Epic notes.

## 2021-07-10 NOTE — Telephone Encounter (Signed)
Patient returned call stated feeling more fatigued and falling asleep if he sits still at home or in the car again.  Has noticed pulse is low again 30s/40s.  Discussed with patient follow up with cardiology this week show BP/pulse log and notify them symptoms occurring again with low pulse as his medication had to be adjusted previously for similar symptoms.  Patient with some shortness of breath during 5 minute telephone call.  Asking if he should receive booster #3 for covid since last vaccination in January and on methotrexate and humira.  Discussed with patient I was currently not at home but I would review guidance and contact him again with CDC/ACIP recommendations.  Patient speaking full sentences but taking breath in middle of sentences frequently.  A&Ox3 no cough/wheezing/congestion/throat clearing noted during call.  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  Wearing oxygen at night with CPAP.  Patient verbalized understanding information/instructions, agreed with plan of care and had no further questions at this time.

## 2021-07-11 ENCOUNTER — Telehealth: Payer: Self-pay | Admitting: Registered Nurse

## 2021-07-11 ENCOUNTER — Encounter: Payer: Self-pay | Admitting: Physician Assistant

## 2021-07-11 ENCOUNTER — Ambulatory Visit (INDEPENDENT_AMBULATORY_CARE_PROVIDER_SITE_OTHER): Payer: No Typology Code available for payment source | Admitting: Physician Assistant

## 2021-07-11 ENCOUNTER — Other Ambulatory Visit: Payer: Self-pay

## 2021-07-11 ENCOUNTER — Ambulatory Visit (INDEPENDENT_AMBULATORY_CARE_PROVIDER_SITE_OTHER): Payer: No Typology Code available for payment source

## 2021-07-11 VITALS — BP 134/90 | HR 55 | Ht 70.0 in | Wt 255.0 lb

## 2021-07-11 DIAGNOSIS — I712 Thoracic aortic aneurysm, without rupture, unspecified: Secondary | ICD-10-CM

## 2021-07-11 DIAGNOSIS — I493 Ventricular premature depolarization: Secondary | ICD-10-CM

## 2021-07-11 DIAGNOSIS — I2510 Atherosclerotic heart disease of native coronary artery without angina pectoris: Secondary | ICD-10-CM | POA: Diagnosis not present

## 2021-07-11 DIAGNOSIS — I2699 Other pulmonary embolism without acute cor pulmonale: Secondary | ICD-10-CM

## 2021-07-11 DIAGNOSIS — I10 Essential (primary) hypertension: Secondary | ICD-10-CM | POA: Diagnosis not present

## 2021-07-11 DIAGNOSIS — G4733 Obstructive sleep apnea (adult) (pediatric): Secondary | ICD-10-CM

## 2021-07-11 MED ORDER — FUROSEMIDE 40 MG PO TABS
40.0000 mg | ORAL_TABLET | ORAL | 3 refills | Status: DC | PRN
Start: 2021-07-11 — End: 2021-10-12

## 2021-07-11 NOTE — Telephone Encounter (Signed)
Per CDC covid vaccination tool: Your responses  Moderately or severely immunocompromised Age: 65 years or older Primary series: Pfizer-BioNTech or Moderna Booster: Pfizer-BioNTech or Moderna at least 4 months ago Based on your responses, you should also get a 2nd booster (using Pfizer-BioNTech or Moderna).  You are considered up to date with your COVID-19 vaccines immediately after getting your 2nd booster.  In addition, you may be eligible for Evusheld, a medicine given by your healthcare provider to help prevent you from getting COVID-19. Talk to your healthcare provider to find out if this option is right for you.  My chart message sent to patient he is eligible for covid vaccine #5 if information from Epic is up to date on his vaccines and #4 was administered 12/17/20 per current guidance.

## 2021-07-11 NOTE — Patient Instructions (Addendum)
Medication Instructions:  Start Furosemide (Lasix) 40 mg for 3 days, then decrease to only as needed for increase weight gain.   *If you need a refill on your cardiac medications before your next appointment, please call your pharmacy*   Lab Work: None ordered   If you have labs (blood work) drawn today and your tests are completely normal, you will receive your results only by: MyChart Message (if you have MyChart) OR A paper copy in the mail If you have any lab test that is abnormal or we need to change your treatment, we will call you to review the results.   Testing/Procedures: Your physician has ordered for you to wear a Zio monitor for 7 days    Follow-Up: At [facility], you and your health needs are our priority.  As part of our continuing mission to provide you with exceptional heart care, we have created designated Provider Care Teams.  These Care Teams include your primary Cardiologist (physician) and Advanced Practice Providers (APPs -  Physician Assistants and Nurse Practitioners) who all work together to provide you with the care you need, when you need it.  We recommend signing up for the patient portal called "MyChart".  Sign up information is provided on this After Visit Summary.  MyChart is used to connect with patients for Virtual Visits (Telemedicine).  Patients are able to view lab/test results, encounter notes, upcoming appointments, etc.  Non-urgent messages can be sent to your provider as well.   To learn more about what you can do with MyChart, go to [email or web address].    Your next appointment:   2 month(s)  The format for your next appointment:   In Person  Provider:   You may see [name], MD or one of the following Advanced Practice Providers on your designated Care Team:   Ronie Spies, PA-C Jacolyn Reedy, PA-C   Other Instructions PLEASE USE YOUR OXYGEN TO HELP WITH YOUR SHORTNESS OF BREATH.   ZIO XT- Long Term Monitor  Instructions  Your physician has requested you wear a ZIO patch monitor for 7 days.  This is a single patch monitor. [employer] supplies one patch monitor per enrollment. Additional stickers are not available. Please do not apply patch if you will be having a Nuclear Stress Test,  Echocardiogram, Cardiac CT, MRI, or Chest Xray during the period you would be wearing the  monitor. The patch cannot be worn during these tests. You cannot remove and re-apply the  ZIO XT patch monitor.  Your ZIO patch monitor will be mailed 3 day USPS to your address on file. It may take 3-5 days  to receive your monitor after you have been enrolled.  Once you have received your monitor, please review the enclosed instructions. Your monitor has already been registered assigning a specific monitor serial # to you.  Billing and Patient Assistance Program Information  We have supplied [employer] with any of your insurance information on file for billing purposes. [employer] offers a sliding scale Patient Assistance Program for patients that do not have  insurance, or whose insurance does not completely cover the cost of the ZIO monitor.  You must apply for the Patient Assistance Program to qualify for this discounted rate.  To apply, please call [employer] at [phone], select option 4, select option 2, ask to apply for  Patient Assistance Program. Meredeth Ide will ask your household income, and how many people  are in your household. They will quote your out-of-pocket cost based on that information.  [employer] will also be able to set up a 27-month, interest-free payment plan if needed.  Applying the monitor   Shave hair from upper left chest.  Hold abrader disc by orange tab. Rub abrader in 40 strokes over the upper left chest as  indicated in your monitor instructions.  Clean area with 4 enclosed alcohol pads. Let dry.  Apply patch as indicated in monitor instructions. Patch will be placed under collarbone on left  side of  chest with arrow pointing upward.  Rub patch adhesive wings for 2 minutes. Remove white label marked "1". Remove the white  label marked "2". Rub patch adhesive wings for 2 additional minutes.  While looking in a mirror, press and release button in center of patch. A small green light will  flash 3-4 times. This will be your only indicator that the monitor has been turned on.  Do not shower for the first 24 hours. You may shower after the first 24 hours.  Press the button if you feel a symptom. You will hear a small click. Record Date, Time and  Symptom in the Patient Logbook.  When you are ready to remove the patch, follow instructions on the last 2 pages of Patient  Logbook. Stick patch monitor onto the last page of Patient Logbook.  Place Patient Logbook in the blue and white box. Use locking tab on box and tape box closed  securely. The blue and white box has prepaid postage on it. Please place it in the mailbox as  soon as possible. Your physician should have your test results approximately 7 days after the  monitor has been mailed back to [facility].  Call [facility] Customer Care at [phone] if you have questions regarding  your ZIO XT patch monitor. Call them immediately if you see an orange light blinking on your  monitor.  If your monitor falls off in less than 4 days, contact our Monitor department at [phone].  If your monitor becomes loose or falls off after 4 days call [employer] at [phone] for  suggestions on securing your monitor  Two Gram Sodium Diet 2000 mg  What is Sodium? Sodium is a mineral found naturally in many foods. The most significant source of sodium in the diet is table salt, which is about 40% sodium.  Processed, convenience, and preserved foods also contain a large amount of sodium.  The body needs only 500 mg of sodium daily to function,  A normal diet provides more than enough sodium even if you do not use salt.  Why Limit Sodium? A  build up of sodium in the body can cause thirst, increased blood pressure, shortness of breath, and water retention.  Decreasing sodium in the diet can reduce edema and risk of heart attack or stroke associated with high blood pressure.  Keep in mind that there are many other factors involved in these health problems.  Heredity, obesity, lack of exercise, cigarette smoking, stress and what you eat all play a role.  General Guidelines: Do not add salt at the table or in cooking.  One teaspoon of salt contains over 2 grams of sodium. Read food labels Avoid processed and convenience foods Ask your dietitian before eating any foods not dicussed in the menu planning guidelines Consult your physician if you wish to use a salt substitute or a sodium containing medication such as antacids.  Limit milk and milk products to 16 oz (2 cups) per day.  Shopping Hints: READ LABELS!! "Dietetic" does not necessarily  mean low sodium. Salt and other sodium ingredients are often added to foods during processing.    Menu Planning Guidelines Food Group Choose More Often Avoid  Beverages (see also the milk group All fruit juices, low-sodium, salt-free vegetables juices, low-sodium carbonated beverages Regular vegetable or tomato juices, commercially softened water used for drinking or cooking  Breads and Cereals Enriched white, wheat, rye and pumpernickel bread, hard rolls and dinner rolls; muffins, cornbread and waffles; most dry cereals, cooked cereal without added salt; unsalted crackers and breadsticks; low sodium or homemade bread crumbs Bread, rolls and crackers with salted tops; quick breads; instant hot cereals; pancakes; commercial bread stuffing; self-rising flower and biscuit mixes; regular bread crumbs or cracker crumbs  Desserts and Sweets Desserts and sweets mad with mild should be within allowance Instant pudding mixes and cake mixes  Fats Butter or margarine; vegetable oils; unsalted salad dressings,  regular salad dressings limited to 1 Tbs; light, sour and heavy cream Regular salad dressings containing bacon fat, bacon bits, and salt pork; snack dips made with instant soup mixes or processed cheese; salted nuts  Fruits Most fresh, frozen and canned fruits Fruits processed with salt or sodium-containing ingredient (some dried fruits are processed with sodium sulfites        Vegetables Fresh, frozen vegetables and low- sodium canned vegetables Regular canned vegetables, sauerkraut, pickled vegetables, and others prepared in brine; frozen vegetables in sauces; vegetables seasoned with ham, bacon or salt pork  Condiments, Sauces, Miscellaneous  Salt substitute with physician's approval; pepper, herbs, spices; vinegar, lemon or lime juice; hot pepper sauce; garlic powder, onion powder, low sodium soy sauce (1 Tbs.); low sodium condiments (ketchup, chili sauce, mustard) in limited amounts (1 tsp.) fresh ground horseradish; unsalted tortilla chips, pretzels, potato chips, popcorn, salsa (1/4 cup) Any seasoning made with salt including garlic salt, celery salt, onion salt, and seasoned salt; sea salt, rock salt, kosher salt; meat tenderizers; monosodium glutamate; mustard, regular soy sauce, barbecue, sauce, chili sauce, teriyaki sauce, steak sauce, Worcestershire sauce, and most flavored vinegars; canned gravy and mixes; regular condiments; salted snack foods, olives, picles, relish, horseradish sauce, catsup   Food preparation: Try these seasonings Meats:    Pork Sage, onion Serve with applesauce  Chicken Poultry seasoning, thyme, parsley Serve with cranberry sauce  Lamb Curry powder, rosemary, garlic, thyme Serve with mint sauce or jelly  Veal Marjoram, basil Serve with current jelly, cranberry sauce  Beef Pepper, bay leaf Serve with dry mustard, unsalted chive butter  Fish [place] leaf, dill Serve with unsalted lemon butter, unsalted parsley butter  Vegetables:    Asparagus Lemon juice   Broccoli  Lemon juice   Carrots Mustard dressing parsley, mint, nutmeg, glazed with unsalted butter and sugar   Green beans Marjoram, lemon juice, nutmeg,dill seed   Tomatoes Basil, marjoram, onion   Spice /blend for [employer]" 4 tsp ground thyme 1 tsp ground sage 3 tsp ground rosemary 4 tsp ground marjoram   Test your knowledge A product that says "Salt Free" may still contain sodium. True or False Garlic Powder and Hot Pepper Sauce an be used as alternative seasonings.True or False Processed foods have more sodium than fresh foods.  True or False Canned Vegetables have less sodium than froze True or False   WAYS TO DECREASE YOUR SODIUM INTAKE Avoid the use of added salt in cooking and at the table.  Table salt (and other prepared seasonings which contain salt) is probably one of the greatest sources of sodium in the  diet.  Unsalted foods can gain flavor from the sweet, sour, and butter taste sensations of herbs and spices.  Instead of using salt for seasoning, try the following seasonings with the foods listed.  Remember: how you use them to enhance natural food flavors is limited only by your creativity... Allspice-Meat, fish, eggs, fruit, peas, red and yellow vegetables Almond Extract-Fruit baked goods Anise Seed-Sweet breads, fruit, carrots, beets, cottage cheese, cookies (tastes like licorice) Basil-Meat, fish, eggs, vegetables, rice, vegetables salads, soups, sauces Bay Leaf-Meat, fish, stews, poultry Burnet-Salad, vegetables (cucumber-like flavor) Caraway Seed-Bread, cookies, cottage cheese, meat, vegetables, cheese, rice Cardamon-Baked goods, fruit, soups Celery Powder or seed-Salads, salad dressings, sauces, meatloaf, soup, bread.Do not use  celery salt Chervil-Meats, salads, fish, eggs, vegetables, cottage cheese (parsley-like flavor) Chili Power-Meatloaf, chicken cheese, corn, eggplant, egg dishes Chives-Salads cottage cheese, egg dishes, soups, vegetables, sauces Cilantro-Salsa,  casseroles Cinnamon-Baked goods, fruit, pork, lamb, chicken, carrots Cloves-Fruit, baked goods, fish, pot roast, green beans, beets, carrots Coriander-Pastry, cookies, meat, salads, cheese (lemon-orange flavor) Cumin-Meatloaf, fish,cheese, eggs, cabbage,fruit pie (caraway flavor) [employer], fruit, eggs, fish, poultry, cottage cheese, vegetables Dill Seed-Meat, cottage cheese, poultry, vegetables, fish, salads, bread Fennel Seed-Bread, cookies, apples, pork, eggs, fish, beets, cabbage, cheese, Licorice-like flavor Garlic-(buds or powder) Salads, meat, poultry, fish, bread, butter, vegetables, potatoes.Do not  use garlic salt Ginger-Fruit, vegetables, baked goods, meat, fish, poultry Horseradish Root-Meet, vegetables, butter Lemon Juice or Extract-Vegetables, fruit, tea, baked goods, fish salads Mace-Baked goods fruit, vegetables, fish, poultry (taste like nutmeg) Maple Extract-Syrups Marjoram-Meat, chicken, fish, vegetables, breads, green salads (taste like Sage) Mint-Tea, lamb, sherbet, vegetables, desserts, carrots, cabbage Mustard, Dry or Seed-Cheese, eggs, meats, vegetables, poultry Nutmeg-Baked goods, fruit, chicken, eggs, vegetables, desserts Onion Powder-Meat, fish, poultry, vegetables, cheese, eggs, bread, rice salads (Do not use   Onion salt) Orange Extract-Desserts, baked goods Oregano-Pasta, eggs, cheese, onions, pork, lamb, fish, chicken, vegetables, green salads Paprika-Meat, fish, poultry, eggs, cheese, vegetables Parsley Flakes-Butter, vegetables, meat fish, poultry, eggs, bread, salads (certain forms may   Contain sodium Pepper-Meat fish, poultry, vegetables, eggs Peppermint Extract-Desserts, baked goods Poppy Seed-Eggs, bread, cheese, fruit dressings, baked goods, noodles, vegetables, cottage  [employer], poultry, meat, fish, cauliflower, turnips,eggs bread Saffron-Rice, bread, veal, chicken, fish, eggs Sage-Meat, fish,  poultry, onions, eggplant, tomateos, pork, stews Savory-Eggs, salads, poultry, meat, rice, vegetables, soups, pork Tarragon-Meat, poultry, fish, eggs, butter, vegetables (licorice-like flavor)  Thyme-Meat, poultry, fish, eggs, vegetables, (clover-like flavor), sauces, soups Tumeric-Salads, butter, eggs, fish, rice, vegetables (saffron-like flavor) Vanilla Extract-Baked goods, candy Vinegar-Salads, vegetables, meat marinades Walnut Extract-baked goods, candy   2. Choose your Foods Wisely   The following is a list of foods to avoid which are high in sodium:  Meats-Avoid all smoked, canned, salt cured, dried and kosher meat and fish as well as Anchovies   [employer] meats:Bologna, Liverwurst, Pastrami Canned meat or fish  Marinated herring Caviar    Pepperoni Corned Beef   Pizza Dried chipped beef  Salami Frozen breaded fish or meat Salt pork Frankfurters or hot dogs  Sardines Gefilte fish   Sausage Ham (boiled ham, Proscuitto Smoked butt    spiced ham)   Spam      TV Dinners Vegetables Canned vegetables (Regular) Relish Canned mushrooms  Sauerkraut Olives    Tomato juice Pickles  Bakery and Dessert Products Canned puddings  Cream pies Cheesecake   Decorated cakes Cookies  Beverages/Juices Tomato juice, regular  Gatorade   V-8 vegetable juice, regular  Breads and Cereals Biscuit mixes  Salted potato chips, corn chips, pretzels Bread stuffing mixes  Salted crackers and rolls Pancake and waffle mixes Self-rising flour  Seasonings Accent    Meat sauces Barbecue sauce  Meat tenderizer Catsup    Monosodium glutamate (MSG) Celery salt   Onion salt Chili sauce   Prepared mustard Garlic salt   Salt, seasoned salt, sea salt Gravy mixes   Soy sauce Horseradish   Steak sauce Ketchup   Tartar sauce Lite salt    Teriyaki sauce Marinade mixes   Worcestershire sauce  Others Baking powder   Cocoa and cocoa mixes Baking soda   Commercial casserole  mixes Candy-caramels, chocolate  Dehydrated soups    Bars, fudge,nougats  Instant rice and pasta mixes Canned broth or soup  Maraschino cherries Cheese, aged and processed cheese and cheese spreads  Learning Assessment Quiz  Indicated T (for True) or F (for False) for each of the following statements:  _____ Fresh fruits and vegetables and unprocessed grains are generally low in sodium _____ Water may contain a considerable amount of sodium, depending on the source _____ You can always tell if a food is high in sodium by tasting it _____ Certain laxatives my be high in sodium and should be avoided unless prescribed   by a physician or pharmacist _____ Salt substitutes may be used freely by anyone on a sodium restricted diet _____ Sodium is present in table salt, food additives and as a natural component of   most foods _____ Table salt is approximately 90% sodium _____ Limiting sodium intake may help prevent excess fluid accumulation in the body _____ On a sodium-restricted diet, seasonings such as bouillon soy sauce, and    cooking wine should be used in place of table salt _____ On an ingredient list, a product which lists monosodium glutamate as the first   ingredient is an appropriate food to include on a low sodium diet  Circle the best answer(s) to the following statements (Hint: there may be more than one correct answer)  11. On a low-sodium diet, some acceptable snack items are:    A. Olives  F. Bean dip   K. Grapefruit juice    B. Salted Pretzels G. Commercial Popcorn   L. Canned peaches    C. Carrot Sticks  H. Bouillon   M. Unsalted nuts   D. [place] fries  I. Peanut butter crackers N. Salami   E. Sweet pickles J. Tomato Juice   O. Pizza  12.  Seasonings that may be used freely on a reduced - sodium diet include   A. Lemon wedges F.Monosodium glutamate K. Celery seed    B.Soysauce   G. Pepper   L. Mustard powder   C. Sea salt  H. Cooking wine  M. Onion flakes   D.  Vinegar  E. Prepared horseradish N. Salsa   E. Sage   J. Worcestershire sauce  O. Chutney

## 2021-07-11 NOTE — Progress Notes (Unsigned)
Patient enrolled for [employer] to mail a 7 day ZIO XT monitor to address on file. 

## 2021-07-12 NOTE — Telephone Encounter (Signed)
Patient seen in cardiology today 5 lb weight gain; zio patch 7 day ordered, to take lasix x 3 days then prn weight gain. Wear oxygen when walking and follow up with pulmonology.

## 2021-07-16 DIAGNOSIS — I493 Ventricular premature depolarization: Secondary | ICD-10-CM

## 2021-07-18 NOTE — Telephone Encounter (Signed)
Reviewed Epic Care Everywhere no cardiology [facility] appt confirmed last week.

## 2021-07-20 ENCOUNTER — Encounter: Payer: Self-pay | Admitting: Pulmonary Disease

## 2021-07-20 ENCOUNTER — Ambulatory Visit (INDEPENDENT_AMBULATORY_CARE_PROVIDER_SITE_OTHER): Payer: No Typology Code available for payment source | Admitting: Pulmonary Disease

## 2021-07-20 ENCOUNTER — Other Ambulatory Visit: Payer: Self-pay

## 2021-07-20 VITALS — BP 150/70 | HR 71 | Temp 98.2°F | Ht 71.0 in | Wt 254.0 lb

## 2021-07-20 DIAGNOSIS — G4733 Obstructive sleep apnea (adult) (pediatric): Secondary | ICD-10-CM

## 2021-07-20 DIAGNOSIS — J9611 Chronic respiratory failure with hypoxia: Secondary | ICD-10-CM

## 2021-07-20 DIAGNOSIS — I2699 Other pulmonary embolism without acute cor pulmonale: Secondary | ICD-10-CM

## 2021-07-20 NOTE — Patient Instructions (Addendum)
We will schedule you for pulmonary function tests as soon as possible to monitor for any changes to your lung function.   If your pulmonary function tests are worse than before, we will check a high resolution CT Chest scan.  I will discuss your case with the cardiology service for any other tests/treatments we need to pursue.

## 2021-07-20 NOTE — Progress Notes (Signed)
Synopsis: Referred by Bernadette Hoit, MD for post-covid 19 dyspnea  Subjective:   PATIENT ID: Ryan Glass GENDER: male DOB: 01/30/1956, MRN: [id number]  HPI  Chief Complaint  Patient presents with   Consult    Coughing, sob.    Ryan Glass is a 65 year old male, never smoker with hypertension, rheumatoid arthritis and obstructive sleep apnea on CPAP who returns to pulmonary clinic for respiratory failure.   He was admitted in June 2022 for progressive dyspnea and chest pain where he was found to have a segmental pulmonary emboli after traveling to [place] and having dental implant surgery done. He was treated with heparin and then transitioned to eliquis.   Echo 05/26/21 showed LVEF 45-50% (down from 60-65% 02/08/21) with severe asymetric left ventricular hypertrophy. Grade I diastolic dysfunction. RV systolic function is moderately reduced and RV size is moderately enlarged with RVSP 50.51mmHg. His echo on 02/08/21 previously showed normal RV function and mildly enlarged size. There is dilation in the RA and LA on both the 6/17 and 3/2 echos.   LHC 05/29/2021 showed minimal CAD and RHC showed normal pressures. RV 26/1, PA 25/4, PA mean 14, PW mean 12, LVEDP 8.  HRCT Chest on 06/07/21 shows band like scarring or partial atelectasis of the dependent lungs which is unchanged from prior studies. No air trapping noted. No subpleural reticulation noted.   PFTs 11/2020 normal ratio, no airway obstruction, no restriction, DLCO 71%  He was started on humira 4 months ago for his rheumatoid arthritis.   He is a never smoker but has significant second hand smoke exposure.   He continues to have significant shortness of breath. He complains of lower extremity edema. He is using CPAP at night with 3L of oxygen.   Past Medical History:  Diagnosis Date   Ascending aortic aneurysm (HCC)    a. CT 05/2021 showed 4.4x4.4cm TAA.   Bilateral lower extremity edema    Chronic gout without tophus    followed  by dr Sharmon Revere    (06-08-2020  per pt last episode right knee 3 wks ago)   CKD (chronic kidney disease), stage II    nephrology--- [facility] PA (10-29-2019 note in epic scanned in  media)   Heart failure with mid-range ejection fraction [facility]) 05/27/2021   05/26/21: Left ventricular ejection fraction, by estimation, is 45 to 50%. There is severe asymmetric left ventricular hypertrophy. G1DD present.    History of COVID-19 06/07/2021   Hypertension    followed by cardiology, dr t. turner   (05-14-2018 nuclear study in epic , normal perfusion with nuclear ef 61%)   Left hydrocele    Low serum potassium level 04/27/2014   OSA on CPAP    per pt uses every night   Palpitations followed by dr t. Mayford Knife   06-08-2020  still feels palipations due to PVCs when exertion but not with chest pain/ discomfort   Pneumonia due to COVID-19 virus    PVC's (premature ventricular contractions) cardiologist--- dr t. turner   Status post PVC ablation by Dr. Ladona Ridgel 2019 with recurrence of frequent PVCs/bigeminy;  prior pseudobradycardia r/t pvcs   Rheumatoid arthritis involving multiple sites [facility])    rheumotology--- dr a. Sharmon Revere  ([facility] in [place])     Family History  Problem Relation Age of Onset   Cardiomyopathy Mother    Heart attack Father      Social History   Socioeconomic History   Marital status: Married    Spouse name: Not  on file   Number of children: Not on file   Years of education: Not on file   Highest education level: Not on file  Occupational History   Not on file  Tobacco Use   Smoking status: Never   Smokeless tobacco: Never  Vaping Use   Vaping Use: Never used  Substance and Sexual Activity   Alcohol use: Yes    Alcohol/week: 5.0 - 7.0 standard drinks    Types: 5 - 7 Cans of beer per week   Drug use: Never   Sexual activity: Not on file  Other Topics Concern   Not on file  Social History Narrative   Not on file   Social Determinants of Health   Financial Resource  Strain: Not on file  Food Insecurity: Not on file  Transportation Needs: Not on file  Physical Activity: Not on file  Stress: Not on file  Social Connections: Not on file  Intimate Partner Violence: Not on file     Allergies  Allergen Reactions   Amlodipine Besylate Swelling    Leg swelling with  daily am dosing; decreased with BID  dosing   Aspirin Itching     Outpatient Medications Prior to Visit  Medication Sig Dispense Refill   acetaminophen-codeine (TYLENOL #3) 300-30 MG tablet Take 1-2 tablets by mouth as needed for moderate pain.     albuterol (VENTOLIN HFA) 108 (90 Base) MCG/ACT inhaler Inhale 2 puffs into the lungs every 4 (four) hours as needed for wheezing or shortness of breath. 8 g 2   apixaban (ELIQUIS) 5 MG TABS tablet Take 1 tablet (5 mg total) by mouth 2 (two) times daily. 60 tablet 5   atorvastatin (LIPITOR) 40 MG tablet Take 1 tablet (40 mg total) by mouth daily. 90 tablet 3   chlorthalidone (HYGROTON) 25 MG tablet Take 25 mg by mouth daily.     colchicine 0.6 MG tablet Take 1-2 tablets by mouth daily as needed (gout).      diltiazem (CARDIZEM CD) 180 MG 24 hr capsule Take 1 capsule (180 mg total) by mouth daily. 90 capsule 3   fluticasone (FLONASE) 50 MCG/ACT nasal spray Place 1 spray into both nostrils daily.     Fluticasone-Salmeterol (ADVAIR) 250-50 MCG/DOSE AEPB Inhale 1 puff into the lungs every 12 (twelve) hours. 60 each 11   folic acid (FOLVITE) 1 MG tablet Take 1 mg by mouth daily.     furosemide (LASIX) 40 MG tablet Take 1 tablet (40 mg total) by mouth as needed. Take 1 tablet by mouth daily for 3 days, then decrease to only as needed for increase weight gain 30 tablet 3   gabapentin (NEURONTIN) 600 MG tablet Take 600 mg by mouth 3 (three) times daily.      HUMIRA PEN 40 MG/0.4ML PNKT Inject 40 mg as directed every 14 (fourteen) days.     hydrALAZINE (APRESOLINE) 100 MG tablet Take 1 tablet (100 mg total) by mouth 3 (three) times daily.     losartan  (COZAAR) 100 MG tablet Take 1 tablet (100 mg total) by mouth daily. 90 tablet 3   methotrexate 2.5 MG tablet Take 15 mg by mouth every Friday.     montelukast (SINGULAIR) 10 MG tablet Take 1 tablet (10 mg total) by mouth at bedtime. 90 tablet 0   nebivolol (BYSTOLIC) 5 MG tablet Take 2 tablets (10 mg total) by mouth at bedtime.     Potassium Chloride ER 20 MEQ TBCR Take 20 mEq by mouth in  the morning and at bedtime.     predniSONE (DELTASONE) 5 MG tablet Take 5 mg by mouth daily.     sildenafil (REVATIO) 20 MG tablet Take 20 mg by mouth daily as needed (erectile dysfunction).     spironolactone (ALDACTONE) 25 MG tablet Take 0.5 tablets (12.5 mg total) by mouth daily. 30 tablet 0   No facility-administered medications prior to visit.    Review of Systems  Constitutional:  Positive for malaise/fatigue. Negative for chills, fever and weight loss.  HENT:  Negative for congestion and sore throat.   Eyes: Negative.   Respiratory:  Positive for shortness of breath. Negative for cough, hemoptysis, sputum production and wheezing.   Cardiovascular:  Positive for leg swelling. Negative for chest pain, palpitations, orthopnea and claudication.  Gastrointestinal:  Negative for abdominal pain, heartburn, nausea and vomiting.  Genitourinary: Negative.   Musculoskeletal: Negative.   Neurological:  Negative for dizziness, weakness and headaches.  Endo/Heme/Allergies: Negative.   Psychiatric/Behavioral: Negative.     Objective:   Vitals:   07/20/21 1632  BP: (!) 150/70  Pulse: 71  Temp: 98.2 F (36.8 C)  TempSrc: Oral  SpO2: 94%  Weight: 254 lb (115.2 kg)  Height:  (1.803 m)    Physical Exam Constitutional:      General: He is not in acute distress.    Appearance: He is obese.  HENT:     Head: Normocephalic and atraumatic.  Eyes:     Conjunctiva/sclera: Conjunctivae normal.  Cardiovascular:     Rate and Rhythm: Normal rate and regular rhythm.     Pulses: Normal pulses.     Heart  sounds: Normal heart sounds. No murmur heard. Pulmonary:     Effort: Pulmonary effort is normal.     Breath sounds: Normal breath sounds. No wheezing, rhonchi or rales.  Abdominal:     General: Bowel sounds are normal.     Palpations: Abdomen is soft.  Musculoskeletal:     Right lower leg: No edema.     Left lower leg: No edema.  Skin:    General: Skin is warm and dry.  Neurological:     General: No focal deficit present.     Mental Status: He is alert.  Psychiatric:        Mood and Affect: Mood normal.        Behavior: Behavior normal.        Thought Content: Thought content normal.        Judgment: Judgment normal.    CBC    Component Value Date/Time   WBC 4.2 06/07/2021 0344   RBC 3.65 (L) 06/07/2021 1014   RBC 3.45 (L) 06/07/2021 0344   HGB 11.4 (L) 06/07/2021 0344   HGB 11.9 (L) 05/24/2021 1038   HCT 34.8 (L) 06/07/2021 0344   HCT 35.4 (L) 05/24/2021 1038   PLT 211 06/07/2021 0344   PLT 171 05/24/2021 1038   MCV 100.9 (H) 06/07/2021 0344   MCV 98 (H) 05/24/2021 1038   MCH 33.0 06/07/2021 0344   MCHC 32.8 06/07/2021 0344   RDW 15.1 06/07/2021 0344   RDW 15.3 05/24/2021 1038   LYMPHSABS 1.2 06/06/2021 1703   LYMPHSABS 0.8 02/20/2021 1215   MONOABS 0.5 06/06/2021 1703   EOSABS 0.0 06/06/2021 1703   EOSABS 0.0 02/20/2021 1215   BASOSABS 0.0 06/06/2021 1703   BASOSABS 0.0 02/20/2021 1215   BMP Latest Ref Rng & Units 06/08/2021 06/07/2021 06/06/2021  Glucose 70 - 99 mg/dL 98 86 161(W)  BUN 8 - 23 mg/dL 17 20 16(X)  Creatinine 0.61 - 1.24 mg/dL 0.96 0.45(W) 0.98(J)  BUN/Creat Ratio 10 - 24 - - -  Sodium 135 - 145 mmol/L 137 138 137  Potassium 3.5 - 5.1 mmol/L 3.9 3.7 4.3  Chloride 98 - 111 mmol/L 107 108 108  CO2 22 - 32 mmol/L 21(L) 21(L) 20(L)  Calcium 8.9 - 10.3 mg/dL 9.3 9.3 9.5   Chest imaging: HRCT Chest 06/07/21 1. Bland appearing, bandlike scarring and or partial atelectasis of the dependent lungs, unchanged compared to prior examination. No evidence  of fibrotic interstitial lung disease. No significant air trapping on expiratory phase imaging. 2. Cardiomegaly and coronary artery disease. 3. Unchanged enlargement of the tubular ascending thoracic aorta, measuring up to 4.4 x 4.4 cm. Recommend annual imaging followup by CTA or MRA if not otherwise imaged.  CXR 07/12/20 There are low lung volumes with central vascular crowding and additional bandlike opacities in the periphery of the left mid lung which could reflect subsegmental atelectasis or scarring. More patchy right infrahilar opacity with airways thickening is present. No pneumothorax or visible effusion. Cardiomediastinal contours are unremarkable. No acute osseous or soft tissue abnormality. Degenerative changes are present in the imaged spine and shoulders. Telemetry leads overlie the chest.  PFT: PFT Results Latest Ref Rng & Units 11/16/2020  FVC-Pre L 4.14  FVC-Predicted Pre % 92  FVC-Post L 4.01  FVC-Predicted Post % 89  Pre FEV1/FVC % % 82  Post FEV1/FCV % % 85  FEV1-Pre L 3.38  FEV1-Predicted Pre % 101  FEV1-Post L 3.40  DLCO uncorrected ml/min/mmHg 18.55  DLCO UNC% % 71  DLCO corrected ml/min/mmHg 18.55  DLCO COR %Predicted % 71  DLVA Predicted % 86  TLC L 7.23  TLC % Predicted % 106  RV % Predicted % 121  PFT 2021: Mild diffusion defect.   Echo:  05/26/21 LVEF 45 to 50%.  Severe asymmetric left ventricular hypertrophy.  Grade 1 diastolic dysfunction.  RV systolic function is moderately reduced.  RV size is moderately enlarged.  Moderately elevated pulmonary arterial systolic pressure.  Left atrium size is moderately dilated.  Right atrial size is mildly dilated.  02/08/21 LVEF 60-65%. Moderate LVH. RV systolic function is normal. RV is mildly enlarged.      Assessment & Plan:   Chronic respiratory failure with hypoxia (HCC)  Pulmonary embolism, other, unspecified chronicity, unspecified whether acute cor pulmonale present (HCC)  OSA (obstructive sleep  apnea)  Discussion: Ryan Glass is a 65 year old male, never smoker with hypertension, rheumatoid arthritis and obstructive sleep apnea on CPAP who returns to pulmonary clinic for respiratory failure.   Based on his pulmonary function tests 11/2020 and his recent CT chest he does not have obstructive lung disease or evidence of interstitial lung disease. We will repeat pulmonary function tests to monitor for any changes. If changes are present we will repeat a HRCT chest for further evaluation of parenchymal disease that may not have been evident on prior scans as he is on humira for rheumatoid arthritis, both of which can lead to inflammation of the lungs. Methotrexate can also lead to pneumonitis reaction.  If we do repeat HRCT chest I will request prone positioning protocol.   I am also concerned about his cardiac function as he has had  a drop in his EF based on recent TTE as well as a decrease in his right ventricular systolic function. He has evidence of hypertensive heart disease with LVH. He continues  to have lower extremity edema. RHC was not evident for pulmonary hypertension. He may need his diuretic regimen adjusted.  If his PFTs are unchanged, then I would recommend a CPAP titration study to ensure his sleep apnea regimen is adequate. We can also consider ECHO with bubble study to rule out shunt physiology.   He may also benefit from cardiopulmonary rehab as there may be a component of deconditioning occurring as he has decreased activity since the end of last year.  Follow up in 6 weeks.  Melody Comas, MD [facility] Office: [phone]   See Amion for Pager Details     Current Outpatient Medications:    acetaminophen-codeine (TYLENOL #3) 300-30 MG tablet, Take 1-2 tablets by mouth as needed for moderate pain., Disp: , Rfl:    albuterol (VENTOLIN HFA) 108 (90 Base) MCG/ACT inhaler, Inhale 2 puffs into the lungs every 4 (four) hours as needed for  wheezing or shortness of breath., Disp: 8 g, Rfl: 2   apixaban (ELIQUIS) 5 MG TABS tablet, Take 1 tablet (5 mg total) by mouth 2 (two) times daily., Disp: 60 tablet, Rfl: 5   atorvastatin (LIPITOR) 40 MG tablet, Take 1 tablet (40 mg total) by mouth daily., Disp: 90 tablet, Rfl: 3   chlorthalidone (HYGROTON) 25 MG tablet, Take 25 mg by mouth daily., Disp: , Rfl:    colchicine 0.6 MG tablet, Take 1-2 tablets by mouth daily as needed (gout). , Disp: , Rfl:    diltiazem (CARDIZEM CD) 180 MG 24 hr capsule, Take 1 capsule (180 mg total) by mouth daily., Disp: 90 capsule, Rfl: 3   fluticasone (FLONASE) 50 MCG/ACT nasal spray, Place 1 spray into both nostrils daily., Disp: , Rfl:    Fluticasone-Salmeterol (ADVAIR) 250-50 MCG/DOSE AEPB, Inhale 1 puff into the lungs every 12 (twelve) hours., Disp: 60 each, Rfl: 11   folic acid (FOLVITE) 1 MG tablet, Take 1 mg by mouth daily., Disp: , Rfl:    furosemide (LASIX) 40 MG tablet, Take 1 tablet (40 mg total) by mouth as needed. Take 1 tablet by mouth daily for 3 days, then decrease to only as needed for increase weight gain, Disp: 30 tablet, Rfl: 3   gabapentin (NEURONTIN) 600 MG tablet, Take 600 mg by mouth 3 (three) times daily. , Disp: , Rfl:    HUMIRA PEN 40 MG/0.4ML PNKT, Inject 40 mg as directed every 14 (fourteen) days., Disp: , Rfl:    hydrALAZINE (APRESOLINE) 100 MG tablet, Take 1 tablet (100 mg total) by mouth 3 (three) times daily., Disp: , Rfl:    losartan (COZAAR) 100 MG tablet, Take 1 tablet (100 mg total) by mouth daily., Disp: 90 tablet, Rfl: 3   methotrexate 2.5 MG tablet, Take 15 mg by mouth every Friday., Disp: , Rfl:    montelukast (SINGULAIR) 10 MG tablet, Take 1 tablet (10 mg total) by mouth at bedtime., Disp: 90 tablet, Rfl: 0   nebivolol (BYSTOLIC) 5 MG tablet, Take 2 tablets (10 mg total) by mouth at bedtime., Disp: , Rfl:    Potassium Chloride ER 20 MEQ TBCR, Take 20 mEq by mouth in the morning and at bedtime., Disp: , Rfl:    predniSONE  (DELTASONE) 5 MG tablet, Take 5 mg by mouth daily., Disp: , Rfl:    sildenafil (REVATIO) 20 MG tablet, Take 20 mg by mouth daily as needed (erectile dysfunction)., Disp: , Rfl:    spironolactone (ALDACTONE) 25 MG tablet, Take 0.5 tablets (12.5 mg total) by mouth  daily., Disp: 30 tablet, Rfl: 0

## 2021-07-20 NOTE — Telephone Encounter (Signed)
Patient contacted via telephone reported pulmonology appt today.  Wearing zio patch and last day 07/23/21 will mail back 07/24/21.  Next PCM appt 07/30/21 Dr Cephus Richer.  Patient unsure of return to work date.  Leg/feet swelling usual not better or worse.  Sp02 RA on awakening 93-94% after taking off cpap with oxygen overnight.  When walking the dog or going outside to walk in the afternoon 86-87% last occurred 2 days ago 84%.  Taking pictures with his phone if sp02 less than 90 on RA.  Wife mainly driving if they go anywhere together as he sometimes is falling asleep in the car.  He stated he was counseled by cardiology to wear oxygen when outside/walking.  He cancelled [facility] appt.  A&Ox3 spoke full sentences without difficulty; no cough/congestion/throat clearing or needing to take breath in middle of sentences.  Discussed with patient I would review Epic tomorrow for pulmonology note and call him if I had further questions.   RN Rolly Salter updated HR regarding RTW pending pulmonology and cardiology evaluations.  Patient verbalized understanding information/instructions, agreed with plan of care and had no further questions at this time.

## 2021-07-21 NOTE — Telephone Encounter (Signed)
Summary pulmonary appt 07/20/21  Ryan Glass is a 65 year old male, never smoker with hypertension, rheumatoid arthritis and obstructive sleep apnea on CPAP who returns to pulmonary clinic for respiratory failure.    Based on his pulmonary function tests 11/2020 and his recent CT chest he does not have obstructive lung disease or evidence of interstitial lung disease. We will repeat pulmonary function tests to monitor for any changes. If changes are present we will repeat a HRCT chest for further evaluation of parenchymal disease that may not have been evident on prior scans as he is on humira for rheumatoid arthritis, both of which can lead to inflammation of the lungs. Methotrexate can also lead to pneumonitis reaction.  If we do repeat HRCT chest I will request prone positioning protocol.    I am also concerned about his cardiac function as he has had  a drop in his EF based on recent TTE as well as a decrease in his right ventricular systolic function. He has evidence of hypertensive heart disease with LVH. He continues to have lower extremity edema. RHC was not evident for pulmonary hypertension. He may need his diuretic regimen adjusted.   If his PFTs are unchanged, then I would recommend a CPAP titration study to ensure his sleep apnea regimen is adequate. We can also consider ECHO with bubble study to rule out shunt physiology.    He may also benefit from cardiopulmonary rehab as there may be a component of deconditioning occurring as he has decreased activity since the end of last year.   Follow up in 6 weeks.   Melody Comas, MD [facility] Office: [phone]

## 2021-07-24 ENCOUNTER — Other Ambulatory Visit: Payer: Self-pay | Admitting: Adult Health

## 2021-07-25 ENCOUNTER — Telehealth: Payer: Self-pay | Admitting: Pulmonary Disease

## 2021-07-25 NOTE — Telephone Encounter (Signed)
Patient's disability paperwork from [employer] was received via fax on 8/12.   I called and spoke to the patient and he requested the form be filled out to extend his disability until 09/08/2021.  Current disability expires 07/31/21.  I let him know that Dr. Francine Graven will not be back in the office until 8/23 to sign this form.  have emailed it to [facility] for processing.

## 2021-07-25 NOTE — Telephone Encounter (Signed)
Pt is wanting to know if disability paperwork from [facility] was received to fill out. Pls regard; [phone].

## 2021-07-26 NOTE — Telephone Encounter (Signed)
PFTs 08/01/21 1500 appt  Next pulmonology appt 09/05/21 per epic.  Patient also requested [employer] disability paperwork be extended to 09/08/21.  His provider out of office until 08/01/21 and pulmonary office staff notified him paperwork would not be completed until next week.  Patient contacted via telephone and confirmed above.  Patient also verified he had 11 teeth pulled lower jaw all in one day while in [place] and dental implant pins placed in his lower jaw.  Denied infections or canal root surgery required.  Patient asked if this could have contributed to his PE.  Discussed long flight/immobility prolonged or dental infection has been known to contribute to clots/heart problems.  Patient reported 80s sp02 during the day RA 85-87%.  Dyspnea on exertion/swelling/palpitations the same. Notices palpitations in his throat more when sitting quietly.  Discussed with patient if noticing more palpitations and not wearing oxygen to apply oxygen and see if they decrease in frequency.  Patient reported still feeling tired.  Patient reported wearing oxygen when outside the house but not typically when in the house moving around except with cpap. When waking up in the morning sp02 90s% RA.   If in recliner watching TV he is wearing oxygen at home.   Zio patch mailed back today.  Patient asked if he has sufficient supply of his medications and he reported that he does.  Discussed if needs PDRX refills prior to return onsite to notify clinic staff. Patient in care with spouse driving during call; duration 9 minutes spoke full sentences without difficulty A&Ox3 no cough/congestion/throat clearing or stopping to inspire mid sentence.  Patient verbalized understanding information/instructions, agreed with plan of care and had no further questions at this time.

## 2021-07-28 ENCOUNTER — Other Ambulatory Visit: Payer: Self-pay | Admitting: Family Medicine

## 2021-07-28 NOTE — Telephone Encounter (Signed)
Prepared paperwork with extension to 09/08/2021 - emailed to Darilyn to obtain Dr. Lanora Manis signature -pr

## 2021-08-01 ENCOUNTER — Other Ambulatory Visit: Payer: Self-pay

## 2021-08-01 ENCOUNTER — Ambulatory Visit (INDEPENDENT_AMBULATORY_CARE_PROVIDER_SITE_OTHER): Payer: No Typology Code available for payment source | Admitting: Pulmonary Disease

## 2021-08-01 DIAGNOSIS — J9611 Chronic respiratory failure with hypoxia: Secondary | ICD-10-CM | POA: Diagnosis not present

## 2021-08-01 LAB — PULMONARY FUNCTION TEST
DL/VA % pred: 100 %
DL/VA: 4.17 ml/min/mmHg/L
DLCO cor % pred: 99 %
DLCO cor: 25.93 ml/min/mmHg
DLCO unc % pred: 89 %
DLCO unc: 23.24 ml/min/mmHg
FEF 25-75 Post: 2.76 L/sec
FEF 25-75 Pre: 2.44 L/sec
FEF2575-%Change-Post: 13 %
FEF2575-%Pred-Post: 105 %
FEF2575-%Pred-Pre: 92 %
FEV1-%Change-Post: 2 %
FEV1-%Pred-Post: 90 %
FEV1-%Pred-Pre: 87 %
FEV1-Post: 2.99 L
FEV1-Pre: 2.91 L
FEV1FVC-%Change-Post: 5 %
FEV1FVC-%Pred-Pre: 103 %
FEV6-%Change-Post: -3 %
FEV6-%Pred-Post: 85 %
FEV6-%Pred-Pre: 88 %
FEV6-Post: 3.6 L
FEV6-Pre: 3.74 L
FEV6FVC-%Change-Post: 0 %
FEV6FVC-%Pred-Post: 105 %
FEV6FVC-%Pred-Pre: 105 %
FVC-%Change-Post: -2 %
FVC-%Pred-Post: 82 %
FVC-%Pred-Pre: 84 %
FVC-Post: 3.65 L
FVC-Pre: 3.75 L
Post FEV1/FVC ratio: 82 %
Post FEV6/FVC ratio: 100 %
Pre FEV1/FVC ratio: 78 %
Pre FEV6/FVC Ratio: 100 %
RV % pred: 126 %
RV: 2.89 L
TLC % pred: 102 %
TLC: 6.95 L

## 2021-08-01 NOTE — Progress Notes (Signed)
PFT done today. 

## 2021-08-01 NOTE — Telephone Encounter (Signed)
Dr. Francine Graven signed form to extend disability to 09/08/21 - faxed to [facility] - fax# [phone]

## 2021-08-02 ENCOUNTER — Emergency Department (HOSPITAL_COMMUNITY): Payer: No Typology Code available for payment source

## 2021-08-02 ENCOUNTER — Other Ambulatory Visit: Payer: Self-pay

## 2021-08-02 ENCOUNTER — Telehealth: Payer: Self-pay | Admitting: Registered Nurse

## 2021-08-02 ENCOUNTER — Encounter: Payer: Self-pay | Admitting: Registered Nurse

## 2021-08-02 ENCOUNTER — Telehealth: Payer: Self-pay | Admitting: Cardiology

## 2021-08-02 ENCOUNTER — Encounter (HOSPITAL_COMMUNITY): Payer: Self-pay | Admitting: Emergency Medicine

## 2021-08-02 ENCOUNTER — Inpatient Hospital Stay (HOSPITAL_COMMUNITY)
Admission: EM | Admit: 2021-08-02 | Discharge: 2021-08-08 | DRG: 227 | Disposition: A | Discharge status: Home / Self Care | Payer: No Typology Code available for payment source | Attending: Cardiology | Admitting: Cardiology

## 2021-08-02 DIAGNOSIS — Z888 Allergy status to other drugs, medicaments and biological substances status: Secondary | ICD-10-CM | POA: Diagnosis not present

## 2021-08-02 DIAGNOSIS — I712 Thoracic aortic aneurysm, without rupture: Secondary | ICD-10-CM | POA: Diagnosis present

## 2021-08-02 DIAGNOSIS — I130 Hypertensive heart and chronic kidney disease with heart failure and stage 1 through stage 4 chronic kidney disease, or unspecified chronic kidney disease: Secondary | ICD-10-CM | POA: Diagnosis present

## 2021-08-02 DIAGNOSIS — I2510 Atherosclerotic heart disease of native coronary artery without angina pectoris: Secondary | ICD-10-CM | POA: Diagnosis present

## 2021-08-02 DIAGNOSIS — I4510 Unspecified right bundle-branch block: Secondary | ICD-10-CM | POA: Diagnosis present

## 2021-08-02 DIAGNOSIS — I422 Other hypertrophic cardiomyopathy: Secondary | ICD-10-CM | POA: Diagnosis present

## 2021-08-02 DIAGNOSIS — Z7901 Long term (current) use of anticoagulants: Secondary | ICD-10-CM

## 2021-08-02 DIAGNOSIS — R0902 Hypoxemia: Secondary | ICD-10-CM | POA: Diagnosis not present

## 2021-08-02 DIAGNOSIS — I10 Essential (primary) hypertension: Secondary | ICD-10-CM | POA: Diagnosis not present

## 2021-08-02 DIAGNOSIS — Z86711 Personal history of pulmonary embolism: Secondary | ICD-10-CM | POA: Diagnosis not present

## 2021-08-02 DIAGNOSIS — I472 Ventricular tachycardia, unspecified: Secondary | ICD-10-CM

## 2021-08-02 DIAGNOSIS — N182 Chronic kidney disease, stage 2 (mild): Secondary | ICD-10-CM | POA: Diagnosis present

## 2021-08-02 DIAGNOSIS — Z886 Allergy status to analgesic agent status: Secondary | ICD-10-CM | POA: Diagnosis not present

## 2021-08-02 DIAGNOSIS — I499 Cardiac arrhythmia, unspecified: Secondary | ICD-10-CM

## 2021-08-02 DIAGNOSIS — I5042 Chronic combined systolic (congestive) and diastolic (congestive) heart failure: Secondary | ICD-10-CM | POA: Diagnosis present

## 2021-08-02 DIAGNOSIS — I480 Paroxysmal atrial fibrillation: Secondary | ICD-10-CM | POA: Diagnosis not present

## 2021-08-02 DIAGNOSIS — Z8249 Family history of ischemic heart disease and other diseases of the circulatory system: Secondary | ICD-10-CM | POA: Diagnosis not present

## 2021-08-02 DIAGNOSIS — Z20822 Contact with and (suspected) exposure to covid-19: Secondary | ICD-10-CM | POA: Diagnosis present

## 2021-08-02 DIAGNOSIS — Z7952 Long term (current) use of systemic steroids: Secondary | ICD-10-CM | POA: Diagnosis not present

## 2021-08-02 DIAGNOSIS — M1A9XX0 Chronic gout, unspecified, without tophus (tophi): Secondary | ICD-10-CM | POA: Diagnosis present

## 2021-08-02 DIAGNOSIS — G4733 Obstructive sleep apnea (adult) (pediatric): Secondary | ICD-10-CM | POA: Diagnosis present

## 2021-08-02 DIAGNOSIS — Z6835 Body mass index (BMI) 35.0-35.9, adult: Secondary | ICD-10-CM

## 2021-08-02 DIAGNOSIS — I493 Ventricular premature depolarization: Secondary | ICD-10-CM

## 2021-08-02 DIAGNOSIS — M069 Rheumatoid arthritis, unspecified: Secondary | ICD-10-CM | POA: Diagnosis present

## 2021-08-02 DIAGNOSIS — Z79899 Other long term (current) drug therapy: Secondary | ICD-10-CM

## 2021-08-02 DIAGNOSIS — I421 Obstructive hypertrophic cardiomyopathy: Secondary | ICD-10-CM | POA: Diagnosis not present

## 2021-08-02 DIAGNOSIS — Z95810 Presence of automatic (implantable) cardiac defibrillator: Secondary | ICD-10-CM

## 2021-08-02 DIAGNOSIS — Z8616 Personal history of COVID-19: Secondary | ICD-10-CM | POA: Diagnosis not present

## 2021-08-02 LAB — HEPATIC FUNCTION PANEL
ALT: 26 U/L (ref 0–44)
AST: 20 U/L (ref 15–41)
Albumin: 3.7 g/dL (ref 3.5–5.0)
Alkaline Phosphatase: 71 U/L (ref 38–126)
Bilirubin, Direct: 0.2 mg/dL (ref 0.0–0.2)
Indirect Bilirubin: 0.5 mg/dL (ref 0.3–0.9)
Total Bilirubin: 0.7 mg/dL (ref 0.3–1.2)
Total Protein: 6.1 g/dL — ABNORMAL LOW (ref 6.5–8.1)

## 2021-08-02 LAB — TROPONIN I (HIGH SENSITIVITY): Troponin I (High Sensitivity): 11 ng/L (ref <18)

## 2021-08-02 LAB — BASIC METABOLIC PANEL
Anion gap: 8 (ref 5–15)
BUN: 22 mg/dL (ref 8–23)
CO2: 21 mmol/L — ABNORMAL LOW (ref 22–32)
Calcium: 9.1 mg/dL (ref 8.9–10.3)
Chloride: 107 mmol/L (ref 98–111)
Creatinine, Ser: 1.1 mg/dL (ref 0.61–1.24)
GFR, Estimated: >60 mL/min (ref >60)
Glucose, Bld: 133 mg/dL — ABNORMAL HIGH (ref 70–99)
Potassium: 3.8 mmol/L (ref 3.5–5.1)
Sodium: 136 mmol/L (ref 135–145)

## 2021-08-02 LAB — CBC
HCT: 40.5 % (ref 39.0–52.0)
Hemoglobin: 13.4 g/dL (ref 13.0–17.0)
MCH: 34.4 pg — ABNORMAL HIGH (ref 26.0–34.0)
MCHC: 33.1 g/dL (ref 30.0–36.0)
MCV: 104.1 fL — ABNORMAL HIGH (ref 80.0–100.0)
Platelets: 168 10*3/uL (ref 150–400)
RBC: 3.89 MIL/uL — ABNORMAL LOW (ref 4.22–5.81)
RDW: 15.3 % (ref 11.5–15.5)
WBC: 5.5 10*3/uL (ref 4.0–10.5)
nRBC: 0 % (ref 0.0–0.2)

## 2021-08-02 LAB — MAGNESIUM: Magnesium: 2 mg/dL (ref 1.7–2.4)

## 2021-08-02 LAB — RESP PANEL BY RT-PCR (FLU A&B, COVID) ARPGX2
Influenza A by PCR: NEGATIVE
Influenza B by PCR: NEGATIVE
SARS Coronavirus 2 by RT PCR: NEGATIVE

## 2021-08-02 LAB — BRAIN NATRIURETIC PEPTIDE: B Natriuretic Peptide: 50.5 pg/mL (ref 0.0–100.0)

## 2021-08-02 IMAGING — CR DG CHEST 2V
2 series · 2 of 2 positions shown · non-contrast
Comparison: [DATE]

CLINICAL DATA: Shortness of breath

EXAM:
CHEST - 2 VIEW

[chest pa]
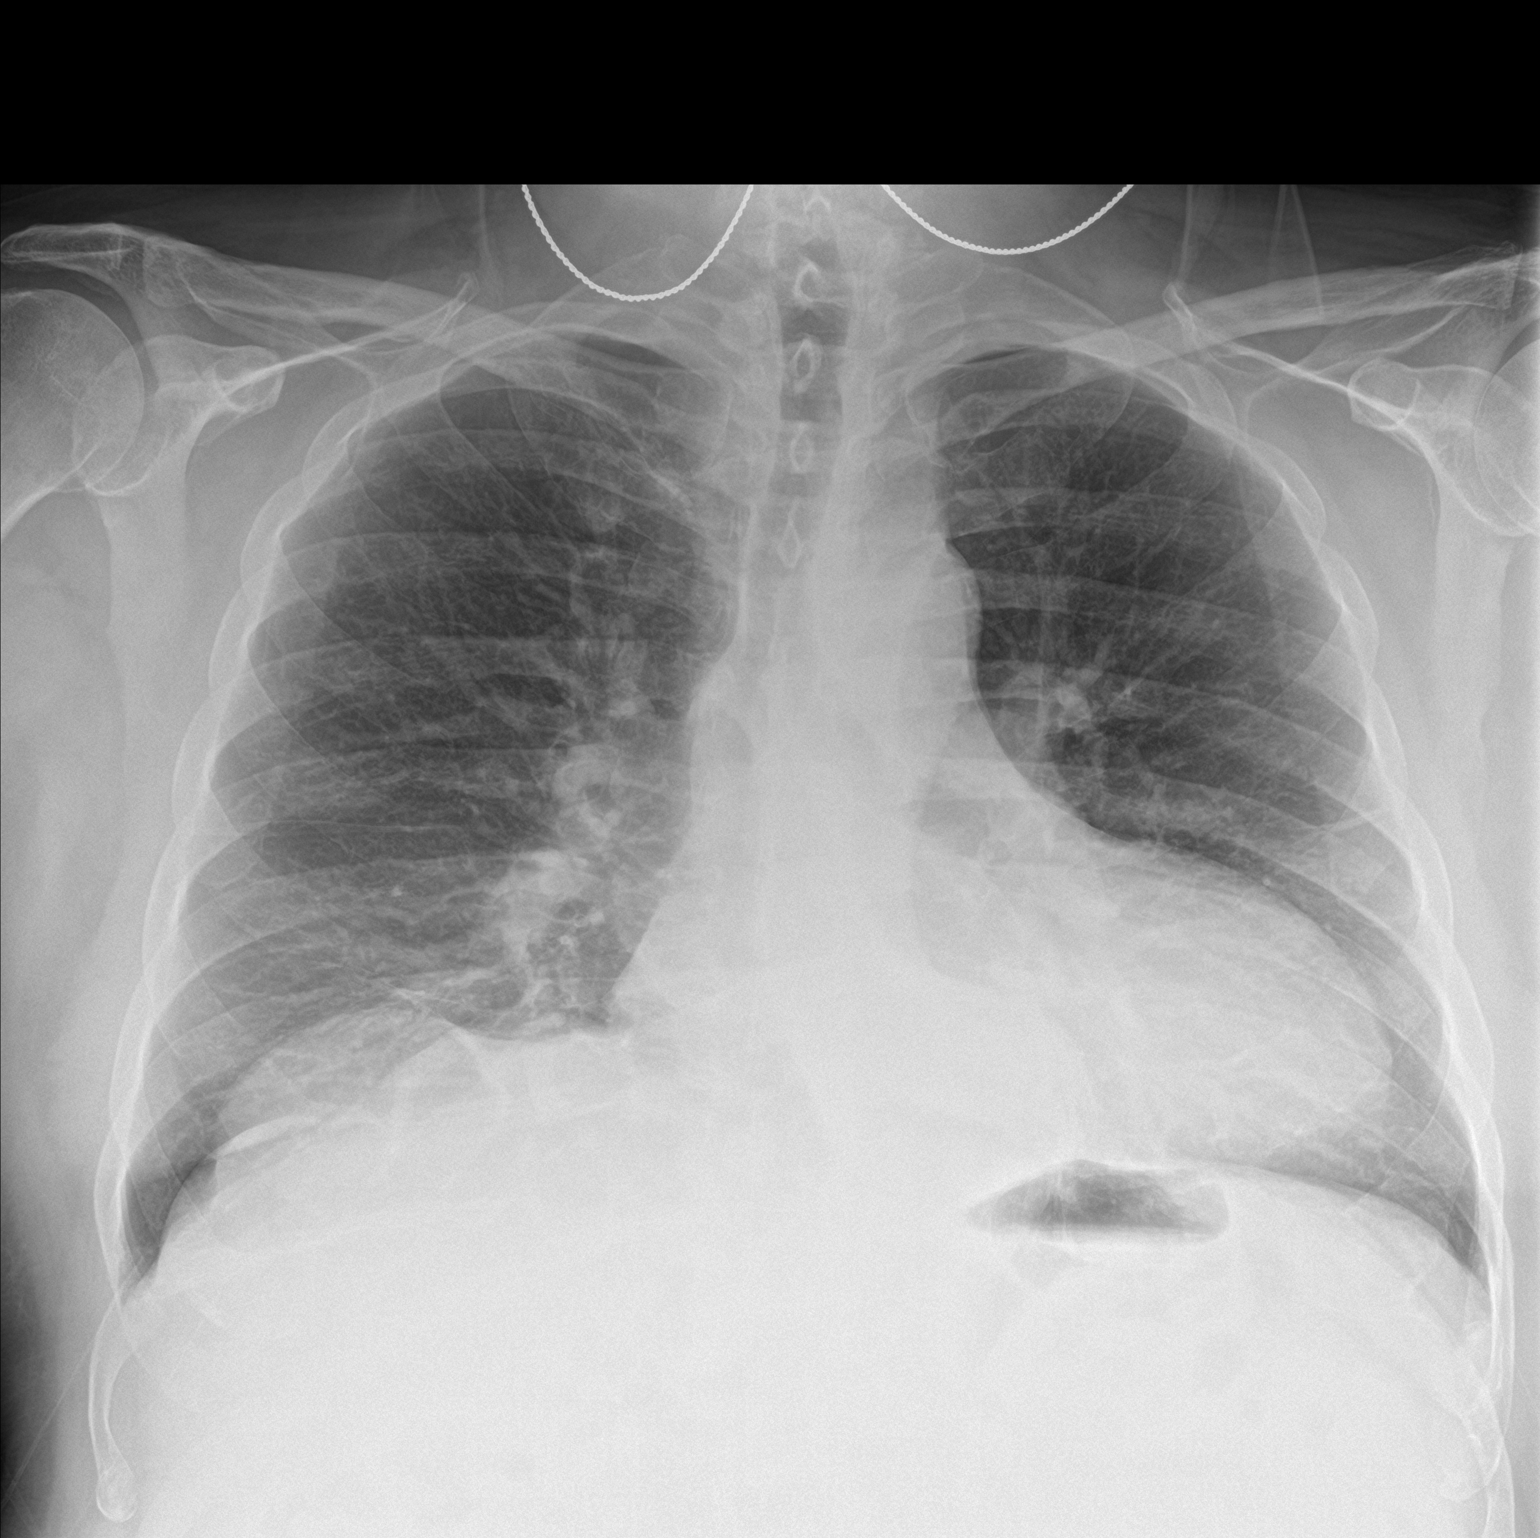

[chest lat]
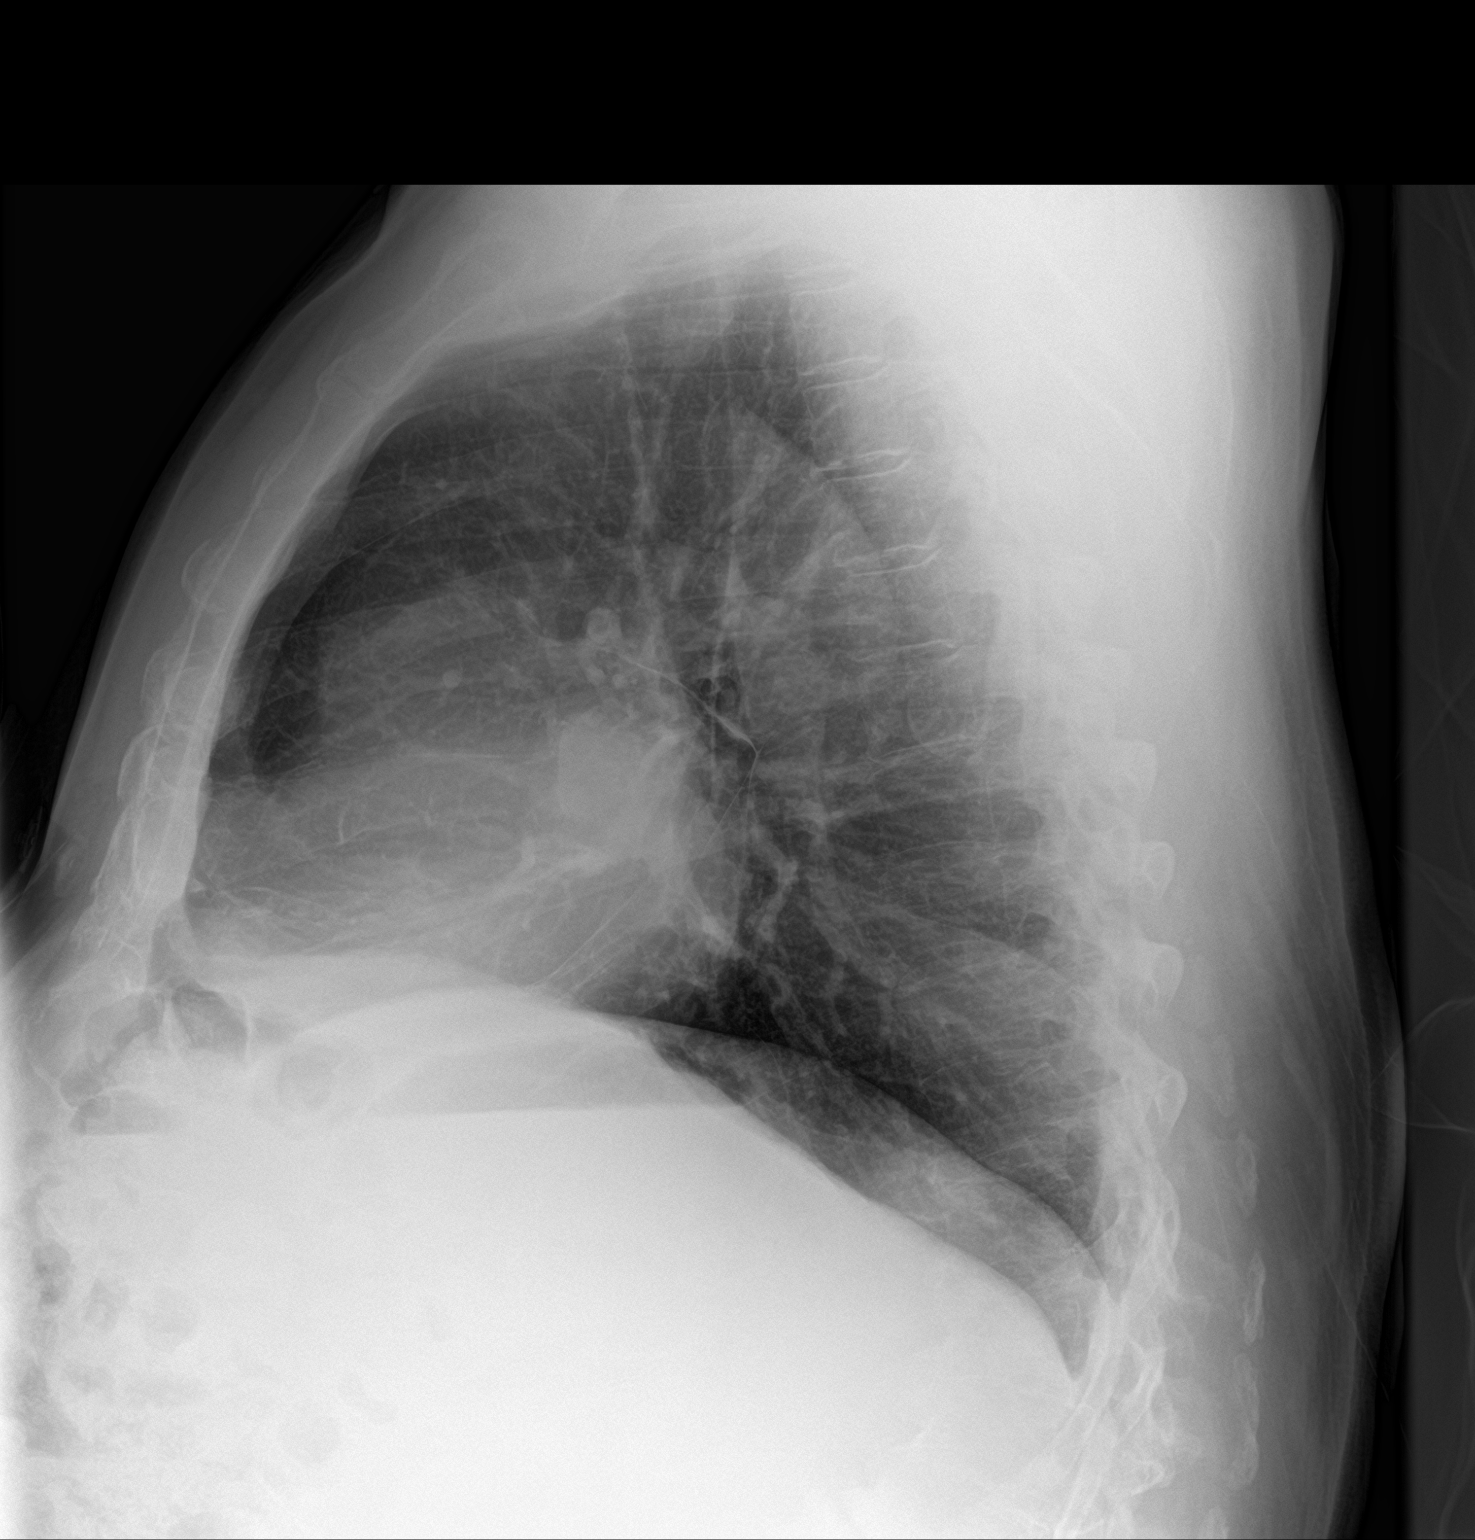

[2 of 2 positions shown; findings below may reference images not displayed]

FINDINGS: Cardiomegaly. Linear atelectasis or scarring. No overt edema or
effusions. No acute bony abnormality.
IMPRESSION: Bibasilar atelectasis or scarring.

Cardiomegaly.

No active disease.

## 2021-08-02 MED ORDER — PROPRANOLOL HCL 20 MG PO TABS
20.0000 mg | ORAL_TABLET | Freq: Three times a day (TID) | ORAL | Status: DC
  Filled 2021-08-02: qty 1

## 2021-08-02 MED ORDER — GABAPENTIN 300 MG PO CAPS
600.0000 mg | ORAL_CAPSULE | Freq: Three times a day (TID) | ORAL | Status: DC
  Administered 2021-08-03 – 2021-08-08 (×14): 600 mg via ORAL
  Filled 2021-08-02 (×15): qty 2

## 2021-08-02 MED ORDER — MONTELUKAST SODIUM 10 MG PO TABS
10.0000 mg | ORAL_TABLET | Freq: Every day | ORAL | Status: DC
  Administered 2021-08-03 – 2021-08-07 (×5): 10 mg via ORAL
  Filled 2021-08-02 (×6): qty 1

## 2021-08-02 MED ORDER — METHOTREXATE 2.5 MG PO TABS
15.0000 mg | ORAL_TABLET | ORAL | Status: DC
  Filled 2021-08-02: qty 6

## 2021-08-02 MED ORDER — POTASSIUM CHLORIDE CRYS ER 20 MEQ PO TBCR
20.0000 meq | EXTENDED_RELEASE_TABLET | Freq: Every day | ORAL | Status: DC
  Administered 2021-08-03 – 2021-08-08 (×5): 20 meq via ORAL
  Filled 2021-08-02 (×5): qty 1

## 2021-08-02 MED ORDER — MOMETASONE FURO-FORMOTEROL FUM 200-5 MCG/ACT IN AERO
2.0000 | INHALATION_SPRAY | Freq: Two times a day (BID) | RESPIRATORY_TRACT | Status: DC
  Administered 2021-08-03 – 2021-08-08 (×11): 2 via RESPIRATORY_TRACT
  Filled 2021-08-02: qty 8.8

## 2021-08-02 MED ORDER — LOSARTAN POTASSIUM 50 MG PO TABS
100.0000 mg | ORAL_TABLET | Freq: Every day | ORAL | Status: DC
  Administered 2021-08-03 – 2021-08-08 (×6): 100 mg via ORAL
  Filled 2021-08-02 (×6): qty 2

## 2021-08-02 MED ORDER — ACETAMINOPHEN 325 MG PO TABS
650.0000 mg | ORAL_TABLET | ORAL | Status: DC | PRN
  Administered 2021-08-03 – 2021-08-06 (×11): 650 mg via ORAL
  Filled 2021-08-02 (×11): qty 2

## 2021-08-02 MED ORDER — ATORVASTATIN CALCIUM 40 MG PO TABS
40.0000 mg | ORAL_TABLET | Freq: Every day | ORAL | Status: DC
  Administered 2021-08-03 – 2021-08-08 (×5): 40 mg via ORAL
  Filled 2021-08-02 (×5): qty 1

## 2021-08-02 MED ORDER — HYDRALAZINE HCL 50 MG PO TABS
50.0000 mg | ORAL_TABLET | Freq: Four times a day (QID) | ORAL | Status: DC | PRN
  Administered 2021-08-07 (×2): 50 mg via ORAL
  Filled 2021-08-02 (×4): qty 1

## 2021-08-02 MED ORDER — PREDNISONE 10 MG PO TABS
5.0000 mg | ORAL_TABLET | Freq: Every day | ORAL | Status: DC
Start: 2021-08-03 — End: 2021-08-08
  Administered 2021-08-03 – 2021-08-08 (×5): 5 mg via ORAL
  Filled 2021-08-02 (×6): qty 1

## 2021-08-02 MED ORDER — SPIRONOLACTONE 12.5 MG HALF TABLET
12.5000 mg | ORAL_TABLET | Freq: Every day | ORAL | Status: DC
  Administered 2021-08-03 – 2021-08-08 (×6): 12.5 mg via ORAL
  Filled 2021-08-02 (×6): qty 1

## 2021-08-02 NOTE — Telephone Encounter (Signed)
Patient called asking for status - he called [employer] this morning and they did not have the form.  I gave him the fax # and told him it was sent at 3:30p so it may not have been sorted into his claim yet.  He will call them again.

## 2021-08-02 NOTE — Telephone Encounter (Signed)
Zio results: 7 day wear  1 symptomatic episode of VT on strip 4, 5 minutes average rate of 159.  Symptomatic Afib longest duration 9 minutes  Full results in chart for review.

## 2021-08-02 NOTE — Telephone Encounter (Signed)
Patient received call from cardiology instructing him to go to ER. He is calling to find out if he can wait until tomorrow. Reviewed Epic and zio patch noted symptomatic VT and afib.  Dr Mayford Knife wants patient to go to ER today.    Patient reported today he is not feeling the greatest but doesn't feel like when he pushed button to record on zio patch.  Discussed with patient afib and VT can lead to more blood clots and/or heart/brain not receiving enough oxygen.  Patient currently not wearing oxygen.  Instructed him to wear his home oxygen as hypoxia can worsen these heart rhythms.  Instructed patient to go to ER.  Discussed it is possible he could be admitted today.  Spouse recently fell/hurt wrist and suffered head injury.  Patient concerned about leaving her alone.  Discussed with patient I would call and check in on him again tomorrow.  Spouse overheard conversation with patient and encouraging him to go to ER now also.  Patient stated he will go to ER today  Patient  A&Ox3 spoke full sentences without difficulty no shortness of breath audible during 8 minute telephone call.

## 2021-08-02 NOTE — ED Provider Notes (Signed)
[facility] EMERGENCY DEPARTMENT Provider Note   CSN: [id number] Arrival date & time: 08/02/21  2055     History Chief Complaint  Patient presents with   Shortness of Breath   Chest Pain    Ryan Glass is a 65 y.o. male.  65 year old male with prior medical history as detailed below presents for evaluation.  Patient was referred to the ED from cardiology.  Patient with Zio patch with recorded episode of V. tach lasting 4 to 5 minutes.  Rate recorded was 159.  Patient was also recorded with a 9-minute episode of A. fib.  Patient is currently asymptomatic.  He denies any current pain or discomfort.  Denies current shortness of breath.  He denies recent fever.  Patient denies prior history of arrhythmia.  Patient is known to Dr. [name] of [facility] cardiology.  Patient is on Eliquis.  The history is provided by the patient.  Illness Location:  Patient referred to ED for arrhythmia.  Patient with reported episode of V. tach. Severity:  Moderate Onset quality:  Sudden Timing:  Unable to specify Progression:  Unable to specify Chronicity:  New     Past Medical History:  Diagnosis Date   Ascending aortic aneurysm ([place])    a. CT 05/2021 showed 4.4x4.4cm TAA.   Bilateral lower extremity edema    Chronic gout without tophus    followed by dr Gerilyn Nestle    (06-08-2020  per pt last episode right knee 3 wks ago)   CKD (chronic kidney disease), stage II    nephrology--- Jen Mow PA (10-29-2019 note in epic scanned in  media)   Heart failure with mid-range ejection fraction [facility]) 05/27/2021   05/26/21: Left ventricular ejection fraction, by estimation, is 45 to 50%. There is severe asymmetric left ventricular hypertrophy. G1DD present.    History of COVID-19 06/07/2021   Hypertension    followed by cardiology, dr t. turner   (05-14-2018 nuclear study in epic , normal perfusion with nuclear ef 61%)   Left hydrocele    Low serum potassium level 04/27/2014   OSA on  CPAP    per pt uses every night   Palpitations followed by dr t. [name]   06-08-2020  still feels palipations due to PVCs when exertion but not with chest pain/ discomfort   Pneumonia due to COVID-19 virus    PVC's (premature ventricular contractions) cardiologist--- dr t. turner   Status post PVC ablation by [name] 2019 with recurrence of frequent PVCs/bigeminy;  prior pseudobradycardia r/t pvcs   Rheumatoid arthritis involving multiple sites [facility])    rheumotology--- dr a. Gerilyn Nestle  ([facility] in [place])    Patient Active Problem List   Diagnosis Date Noted   Acute on chronic respiratory failure ([place]) 06/07/2021   History of COVID-19 06/07/2021   Rheumatoid arthritis involving multiple sites on Humira ([place]) 06/07/2021   On chronic prednisone therapy for Rheumatoid Arthritis 06/07/2021   CAD (coronary artery disease), non-obstructive 05/30/2021   Elevated troponin    Precordial chest pain    Heart failure with mid-range ejection fraction ([place]) 05/27/2021   LVH (left ventricular hypertrophy) due to hypertensive disease, with heart failure ([place]) 05/27/2021   Class 2 severe obesity with serious comorbidity and body mass index (BMI) of 35.0 to 35.9 in adult [facility]) 05/26/2021   Pulmonary embolism ([place]) 05/25/2021   Dyspnea on exertion    Thoracic aortic aneurysm without rupture ([place]) 02/01/2021   Ascending aortic aneurysm ([place]) 02/01/2021   Other hyperlipidemia 10/14/2020  Testicular swelling 05/14/2019   Pain in right knee 01/29/2019   PVC's (premature ventricular contractions)    PVC (premature ventricular contraction) 10/07/2018   Dilated aortic root ([facility])    Allergic rhinitis 01/10/2016   Chronic gout without tophus 01/10/2016   Drug therapy 01/10/2016   OSA (obstructive sleep apnea) 01/10/2016   Colonic polyp 12/14/2015   Rt groin pain 12/14/2015   Scoliosis of thoracolumbar spine 12/14/2015   Right lower quadrant abdominal pain 04/20/2015   Arthritis of knee 02/22/2015   Edema  of extremities 02/22/2015   Essential hypertension 02/22/2015   Preventative health care 02/22/2015   Bradycardia by electrocardiogram 04/16/2014   Chest pain 04/16/2014    Past Surgical History:  Procedure Laterality Date   HYDROCELE EXCISION Left 06/14/2020   Procedure: LEFT  HYDROCELECTOMY ADULT;  Surgeon: Robley Fries, MD;  Location: [facility];  Service: Urology;  Laterality: Left;   INCISIONAL HERNIA REPAIR  02-23-2016   _0    LAPAROSCOPIC   LAPAROSCOPIC INGUINAL HERNIA REPAIR Bilateral 08-22-2015  <[id number]>_7<\/[id number]>_6    AND UMBILICAL HERNIA REPAIR   PVC ABLATION N/A 10/07/2018   Procedure: PVC ABLATION;  Surgeon: Evans Lance, MD;  Location: [place] [facility];  Service: Cardiovascular;  Laterality: N/A;   RIGHT/LEFT HEART CATH AND CORONARY ANGIOGRAPHY N/A 05/29/2021   Procedure: RIGHT/LEFT HEART CATH AND CORONARY ANGIOGRAPHY;  Surgeon: Burnell Blanks, MD;  Location: [place] [facility];  Service: Cardiovascular;  Laterality: N/A;   UMBILICAL HERNIA REPAIR  child       Family History  Problem Relation Age of Onset   Cardiomyopathy Mother    Heart attack Father     Social History   Tobacco Use   Smoking status: Never   Smokeless tobacco: Never  Vaping Use   Vaping Use: Never used  Substance Use Topics   Alcohol use: Yes    Alcohol/week: 5.0 - 7.0 standard drinks    Types: 5 - 7 Cans of beer per week   Drug use: Never    Home Medications Prior to Admission medications   Medication Sig Start Date End Date Taking? Authorizing Provider  acetaminophen-codeine (TYLENOL #3) 300-30 MG tablet Take 1-2 tablets by mouth as needed for moderate pain. 05/06/19   [provider]  albuterol (VENTOLIN HFA) 108 (90 Base) MCG/ACT inhaler Inhale 2 puffs into the lungs every 4 (four) hours as needed for wheezing or shortness of breath. 03/02/21   Betancourt, Aura Fey, NP  apixaban (ELIQUIS) 5 MG TABS tablet Take 1 tablet (5 mg total) by mouth 2 (two) times  daily. 06/17/21   Parrett, Fonnie Mu, NP  atorvastatin (LIPITOR) 40 MG tablet Take 1 tablet (40 mg total) by mouth daily. 01/09/21   Sueanne Margarita, MD  chlorthalidone (HYGROTON) 25 MG tablet Take 25 mg by mouth daily. 04/02/21   [provider]  colchicine 0.6 MG tablet Take 1-2 tablets by mouth daily as needed (gout).  01/31/16   [provider]  diltiazem (CARDIZEM CD) 180 MG 24 hr capsule Take 1 capsule (180 mg total) by mouth daily. 02/27/21   Sueanne Margarita, MD  fluticasone (FLONASE) 50 MCG/ACT nasal spray Place 1 spray into both nostrils daily. 06/08/21   Lilland, Alana, DO  Fluticasone-Salmeterol (ADVAIR) 250-50 MCG/DOSE AEPB Inhale 1 puff into the lungs every 12 (twelve) hours. 11/28/20   Freddi Starr, MD  folic acid (FOLVITE) 1 MG tablet Take 1 mg by mouth daily. 01/29/20   [provider]  furosemide (LASIX) 40 MG tablet Take 1 tablet (40 mg total) by mouth as needed. Take 1 tablet by mouth daily for 3 days, then decrease to only as needed for increase weight gain 07/11/21   Imogene Burn, PA-C  gabapentin (NEURONTIN) 600 MG tablet Take 600 mg by mouth 3 (three) times daily.  02/04/19   [provider]  HUMIRA PEN 40 MG/0.4ML PNKT Inject 40 mg as directed every 14 (fourteen) days. 04/09/21   [provider]  hydrALAZINE (APRESOLINE) 100 MG tablet Take 1 tablet (100 mg total) by mouth 3 (three) times daily. 06/08/21   Lilland, Alana, DO  losartan (COZAAR) 100 MG tablet Take 1 tablet (100 mg total) by mouth daily. 06/14/21   Sueanne Margarita, MD  methotrexate 2.5 MG tablet Take 15 mg by mouth every Friday. 06/22/20   [provider]  montelukast (SINGULAIR) 10 MG tablet Take 1 tablet (10 mg total) by mouth at bedtime. 05/31/21   Betancourt, Aura Fey, NP  nebivolol (BYSTOLIC) 5 MG tablet Take 2 tablets (10 mg total) by mouth at bedtime. 06/08/21   Lilland, Alana, DO  Potassium Chloride ER 20 MEQ TBCR Take 20 mEq by mouth in the morning and at bedtime.  06/08/21   Lilland, Alana, DO  predniSONE (DELTASONE) 5 MG tablet Take 5 mg by mouth daily. 01/29/20   [provider]  sildenafil (REVATIO) 20 MG tablet Take 20 mg by mouth daily as needed (erectile dysfunction). 08/03/20   [provider]  spironolactone (ALDACTONE) 25 MG tablet Take 0.5 tablets (12.5 mg total) by mouth daily. 05/31/21   Maness, Arnette Norris, MD    Allergies    Amlodipine besylate and Aspirin  Review of Systems   Review of Systems  All other systems reviewed and are negative.  Physical Exam Updated Vital Signs BP (!) 156/92 (BP Location: Right Arm)   Pulse 68   Temp 97.6 F (36.4 C) (Oral)   Resp 16   Ht 5' 11" (1.803 m)   Wt 115 kg   SpO2 98%   BMI 35.36 kg/m   Physical Exam Vitals and nursing note reviewed.  Constitutional:      General: He is not in acute distress.    Appearance: Normal appearance. He is well-developed.  HENT:     Head: Normocephalic and atraumatic.  Eyes:     Conjunctiva/sclera: Conjunctivae normal.     Pupils: Pupils are equal, round, and reactive to light.  Cardiovascular:     Rate and Rhythm: Normal rate and regular rhythm.     Heart sounds: Normal heart sounds.  Pulmonary:     Effort: Pulmonary effort is normal. No respiratory distress.     Breath sounds: Normal breath sounds.  Abdominal:     General: There is no distension.     Palpations: Abdomen is soft.     Tenderness: There is no abdominal tenderness.  Musculoskeletal:        General: No deformity. Normal range of motion.     Cervical back: Normal range of motion and neck supple.  Skin:    General: Skin is warm and dry.  Neurological:     General: No focal deficit present.     Mental Status: He is alert and oriented to person, place, and time.    ED Results / Procedures / Treatments   Labs (all labs ordered are listed, but only abnormal results are displayed) Labs Reviewed  RESP PANEL BY RT-PCR (FLU A&B, COVID) [id number]  BASIC METABOLIC  PANEL  CBC   MAGNESIUM  TROPONIN I (HIGH SENSITIVITY)    EKG EKG Interpretation  Date/Time:  Wednesday August 02 2021 21:02:42 EDT Ventricular Rate:  69 PR Interval:  218 QRS Duration: 168 QT Interval:  452 QTC Calculation: 484 R Axis:   81 Text Interpretation: Sinus rhythm with 1st degree A-V block Possible Left atrial enlargement Right bundle branch block Abnormal ECG Confirmed by Dene Gentry [phone]) on 08/02/2021 9:26:08 PM  Radiology DG Chest 2 View  Result Date: 08/02/2021 CLINICAL DATA:  Shortness of breath EXAM: CHEST - 2 VIEW COMPARISON:  05/28/2021 FINDINGS: Cardiomegaly. Linear atelectasis or scarring. No overt edema or effusions. No acute bony abnormality. IMPRESSION: Bibasilar atelectasis or scarring. Cardiomegaly. No active disease. Electronically Signed   By: Rolm Baptise M.D.   On: 08/02/2021 21:29   LONG TERM MONITOR (3-14 DAYS)  Result Date: 08/02/2021  Predominant rhythm was normal sinus rhythm with average heart rate 65bpm and ranged from 42 to 119bpm.  Frequent PVCs, ventricular couplets, bigminal PVCs - PVC load 12.6%.  Occasional PACs and atrial couplets and triplets  Ventricular tachycardia lasting 5 minutes and 11 seconds at 184bpm.  Possible atrial fibrillation for 9 minutes and 42 seconds  Patient was symptomatic with afib and VT.  Patch Wear Time:  7 days and 9 hours (2022-08-07T21:26:23-0400 to 2022-08-15T06:29:36-0400) Patient had a min HR of 42 bpm, max HR of 184 bpm, and avg HR of 64 bpm. Predominant underlying rhythm was Sinus Rhythm. Bundle Branch Block/IVCD was present. 1 run of Ventricular Tachycardia occurred lasting 5 mins 11 secs with a max rate of 184 bpm (avg 159 bpm). Atrial Fibrillation occurred (<1% burden), ranging from 90-148 bpm (avg of 116 bpm), the longest lasting 9 mins 42 secs with an avg rate of 116 bpm. True duration of Atrial Fibrillation episodes difficult to ascertain due to the presence of artifact. Ventricular Tachycardia and Atrial  Fibrillation were detected within +/- 45 seconds of symptomatic patient event(s). Isolated SVEs were occasional (1.1%, 6480), SVE Couplets were rare (<1.0%, 276), and SVE Triplets were rare (<1.0%, 32). Isolated VEs were frequent (12.6%, 77054), VE Couplets were rare (<1.0%, 1495), and VE Triplets were rare (<1.0%, 238). Ventricular Bigeminy and Trigeminy were present. MD notification criteria for Ventricular Tachycardia  met - report posted prior to notification per account request (MD).    Procedures Procedures   Medications Ordered in ED Medications - No data to display  ED Course  I have reviewed the triage vital signs and the nursing notes.  Pertinent labs & imaging results that were available during my care of the patient were reviewed by me and considered in my medical decision making (see chart for details).    MDM Rules/Calculators/A&P                           MDM  MSE complete  Ryan Glass was evaluated in Emergency Department on 08/02/2021 for the symptoms described in the history of present illness. He was evaluated in the context of the global COVID-19 pandemic, which necessitated consideration that the patient might be at risk for infection with the SARS-CoV-2 virus that causes COVID-19. Institutional protocols and algorithms that pertain to the evaluation of patients at risk for COVID-19 are in a state of rapid change based on information released by regulatory bodies including the CDC and federal and state organizations. These policies and algorithms were followed during the patient's care in the ED.  Patient  is presenting to the ED with apparent capture of episode of V. tach.  V. tach episode lasted for roughly 4-5 minutes with a heart rate up to 159.  Upon arrival to the ED the patient is comfortable and without complaint.  Case discussed with cardiology - Dr. Kalman Shan - who will come and evaluate for admission.  Patient understands plan of care.   Final Clinical  Impression(s) / ED Diagnoses Final diagnoses:  Cardiac arrhythmia, unspecified cardiac arrhythmia type    Rx / DC Orders ED Discharge Orders     None        Valarie Merino, MD 08/02/21 [id number]

## 2021-08-02 NOTE — ED Triage Notes (Signed)
Pt sent her by Dr, pt was wearing cardiac monitor, received a call from dr saying he had a run of vtach. Pt c/o shob, and cp that radiates into L arm

## 2021-08-02 NOTE — Telephone Encounter (Signed)
[place] is calling to report abnormal results.

## 2021-08-02 NOTE — Telephone Encounter (Signed)
Ryan Reichert, MD  08/02/2021  5:02 PM EDT     Patient had sustained symptomatic ventricular tachycardia on monitor - please call patient and tell him to go to the ER  Spoke with the patient and advised him to go to the ER per Dr. Mayford Knife. Patient verbalized understanding.

## 2021-08-03 ENCOUNTER — Inpatient Hospital Stay (HOSPITAL_COMMUNITY): Payer: No Typology Code available for payment source

## 2021-08-03 DIAGNOSIS — I480 Paroxysmal atrial fibrillation: Secondary | ICD-10-CM

## 2021-08-03 DIAGNOSIS — I472 Ventricular tachycardia: Secondary | ICD-10-CM

## 2021-08-03 DIAGNOSIS — R0902 Hypoxemia: Secondary | ICD-10-CM

## 2021-08-03 DIAGNOSIS — I10 Essential (primary) hypertension: Secondary | ICD-10-CM

## 2021-08-03 LAB — MRSA NEXT GEN BY PCR, NASAL: MRSA by PCR Next Gen: NOT DETECTED

## 2021-08-03 LAB — ECHOCARDIOGRAM COMPLETE BUBBLE STUDY
Area-P 1/2: 2.36 cm2
S' Lateral: 2.7 cm

## 2021-08-03 LAB — TROPONIN I (HIGH SENSITIVITY): Troponin I (High Sensitivity): 13 ng/L (ref <18)

## 2021-08-03 MED ORDER — PROPRANOLOL HCL 10 MG PO TABS
10.0000 mg | ORAL_TABLET | Freq: Three times a day (TID) | ORAL | Status: DC
  Administered 2021-08-04 – 2021-08-08 (×11): 10 mg via ORAL
  Filled 2021-08-03 (×16): qty 1

## 2021-08-03 MED ORDER — APIXABAN 5 MG PO TABS
5.0000 mg | ORAL_TABLET | Freq: Two times a day (BID) | ORAL | Status: DC
  Administered 2021-08-03 – 2021-08-05 (×4): 5 mg via ORAL
  Filled 2021-08-03 (×4): qty 1

## 2021-08-03 NOTE — Plan of Care (Signed)

## 2021-08-03 NOTE — Telephone Encounter (Signed)
Reviewed PFT results worsening from previous.  Provider completed [employer] Disability paperwork.   Patient zio patch with symptomatic afib and v tach noted and patient instructed by cardiology  Dr Mayford Knife to go to ER 08/02/21  Patient was admitted per Epic with telemetry.  Troponin negative.  Closing this encounter as patient sent my chart message/newer encounter open.

## 2021-08-03 NOTE — Consult Note (Addendum)
Cardiology Consultation:   Patient ID: Ryan Glass MRN: [id number]; DOB: 13-Feb-1956  Admit date: 08/02/2021 Date of Consult: 08/03/2021  PCP:  [name], MD   [facility] Providers Cardiologist:  [name], MD  EP: Dr. Ladona Ridgel    Patient Profile:   Ryan Glass is a 65 y.o. 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 Ladona Ridgel) with recurrence since then, RBBB, TAA, CKD (II), HTN, RA(chronic therapy with Humira, methotrexate, prednisone), OSA w/CPAP,  who is being seen 08/03/2021 for the evaluation of AFib, VT at the request of Dr. Mayford Knife.  History of Present Illness:   [name] last saw Dr. Ladona Ridgel April 2022, he recaped hx of PVCs and was s/p catheter ablation in 2019. 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 He had worn another monitor and found to have recurrent PVCs (10% burden), not felt yet to warrant AAD, recommended consideration to switch to BB though left to Dr. Mayford Knife his primary cardiologist.(Though looks like he was on Bystolic at tat time) Lasix was increased for some diastolic HF.  He was hospitalized June 2022 for CP/SOB he had an out pt CT for that found a small PE, symptoms seem out of proportion to his small PE and R/LHC was pursued and an echo. He was found with minimal, non-obstructive CAD, RAP , PAP 25/40mmHg with mean PAP , mean PCWP and LVEDP  Echo was read with LVEF 40-45 with asymmetrical LVH,  though Dr. Mayford Knife felt the echo in fact noted normal LVEF and RV function as well and recommended that a pulm eval be requested. During this stay he was noted to have frequent PVCs on telemetry, resumed on his home diltiazem Discharged on Eliquis 2/2 PE  At his cardiology appt 07/11/21 he continued to c/o SOB/DOE with minimal activity, mentioned seeing pulmonary and  started on O2 though not wearing regularly, reported HRs 35-40 at times at hom, eating out with fast foods and noted some LE swelling. He was advised to wear his O2 as directed by pulmonary Recommended 3 days of his PRN lasix and reduce his salt intake Planned a 1 week monitor to evaluate pt's concerns about slow HRs though suspect to be his PVCs.  His monitor read noting sustained VT ( ?) and new brief AFib associated with symptoms and recommended by Dr. Mayford Knife to go to the hospital for further evaluation and management.  LABS K+ 3.8 Mag 2.0 BUN/Creat 22/1.10 HS Trop 11,13 BNP 50.5 WBC 5.5 H/H 13/40 Plts 168   Monitor noted PVC  burden on 12.6%  Sustained WCT on 07/21/21 4:43PM HR 150-160bpm (max by monitor 184) Duration 50min11sec  07/22/21 9:-3PM episode labeled AF Rates 90's-140's Duration 102min42sec  On admission, his dilt and bystolic were held and started on propanolol 10mg  TID His Eliquis held (for possible procedures) Echo ordered  He has not felt well in months, generally weak and without energy.  Chronically SOB with better and worse days with this. He was in [place] in April and while there acutely his SOB/DOE was worse,  No near syncope or syncope but says sometimes when up and doing things will briefly feel a little off balance. He has not fainted. He does not recall specific times of symptoms with his monitor, pushed the button when he felt his PVCs (feels palpitations  on his throat with these), he had a few episodes of fleeting chest pains, and once or twice momentary or a  few seconds off feeling off balance.  He has never fainted  He reports compliance with CPAP/O23L with his CPAP at night and he says at the recommendation of his pulm wears O2 with activities and PRN  Past Medical History:  Diagnosis Date   Ascending aortic aneurysm (HCC)    a. CT 05/2021 showed 4.4x4.4cm TAA.   Bilateral lower extremity edema    Chronic gout without tophus     followed by dr Sharmon Revere    (06-08-2020  per pt last episode right knee 3 wks ago)   CKD (chronic kidney disease), stage II    nephrology--- [facility] PA (10-29-2019 note in epic scanned in  media)   Heart failure with mid-range ejection fraction [facility]) 05/27/2021   05/26/21: Left ventricular ejection fraction, by estimation, is 45 to 50%. There is severe asymmetric left ventricular hypertrophy. G1DD present.    History of COVID-19 06/07/2021   Hypertension    followed by cardiology, dr t. turner   (05-14-2018 nuclear study in epic , normal perfusion with nuclear ef 61%)   Left hydrocele    Low serum potassium level 04/27/2014   OSA on CPAP    per pt uses every night   Palpitations followed by dr t. Mayford Knife   06-08-2020  still feels palipations due to PVCs when exertion but not with chest pain/ discomfort   Pneumonia due to COVID-19 virus    PVC's (premature ventricular contractions) cardiologist--- dr t. turner   Status post PVC ablation by Dr. Ladona Ridgel 2019 with recurrence of frequent PVCs/bigeminy;  prior pseudobradycardia r/t pvcs   Rheumatoid arthritis involving multiple sites [facility])    rheumotology--- dr a. Sharmon Revere  ([facility] in [place])    Past Surgical History:  Procedure Laterality Date   HYDROCELE EXCISION Left 06/14/2020   Procedure: LEFT  HYDROCELECTOMY ADULT;  Surgeon: Noel Christmas, MD;  Location: [facility];  Service: Urology;  Laterality: Left;   INCISIONAL HERNIA REPAIR  02-23-2016      LAPAROSCOPIC   LAPAROSCOPIC INGUINAL HERNIA REPAIR Bilateral 08-22-2015     AND UMBILICAL HERNIA REPAIR   PVC ABLATION N/A 10/07/2018   Procedure: PVC ABLATION;  Surgeon: Marinus Maw, MD;  Location: [facility] INVASIVE CV LAB;  Service: Cardiovascular;  Laterality: N/A;   RIGHT/LEFT HEART CATH AND CORONARY ANGIOGRAPHY N/A 05/29/2021   Procedure: RIGHT/LEFT HEART CATH AND CORONARY ANGIOGRAPHY;  Surgeon: Kathleene Hazel, MD;  Location: [facility] INVASIVE CV LAB;   Service: Cardiovascular;  Laterality: N/A;   UMBILICAL HERNIA REPAIR  child     Home Medications:  Prior to Admission medications   Medication Sig Start Date End Date Taking? Authorizing Provider  acetaminophen-codeine (TYLENOL #3) 300-30 MG tablet Take 1-2 tablets by mouth as needed for moderate pain. 05/06/19  Yes [provider]  albuterol (VENTOLIN HFA) 108 (90 Base) MCG/ACT inhaler Inhale 2 puffs into the lungs every 4 (four) hours as needed for wheezing or shortness of breath. 03/02/21  Yes Betancourt, Jarold Song, NP  apixaban (ELIQUIS) 5 MG TABS tablet Take 1 tablet (5 mg total) by mouth 2 (two) times daily. 06/17/21  Yes Parrett, Tammy S, NP  atorvastatin (LIPITOR) 40 MG tablet Take 1 tablet (40 mg total) by mouth daily. 01/09/21  Yes Turner, Cornelious Bryant, MD  chlorthalidone (HYGROTON) 25 MG tablet Take 25 mg by mouth daily. 04/02/21  Yes [provider]  colchicine 0.6 MG tablet Take 1-2 tablets by mouth daily as needed (gout).  01/31/16  Yes [provider]  diltiazem (CARDIZEM CD) 180 MG 24 hr capsule Take 1 capsule (180 mg total) by mouth daily. 02/27/21  Yes Turner, Cornelious Bryant, MD  fluticasone (FLONASE) 50 MCG/ACT nasal spray Place 1 spray into both nostrils daily. 06/08/21  Yes Lilland, Alana, DO  Fluticasone-Salmeterol (ADVAIR) 250-50 MCG/DOSE AEPB Inhale 1 puff into the lungs every 12 (twelve) hours. 11/28/20  Yes Martina Sinner, MD  folic acid (FOLVITE) 1 MG tablet Take 1 mg by mouth daily. 01/29/20  Yes [provider]  furosemide (LASIX) 40 MG tablet Take 1 tablet (40 mg total) by mouth as needed. Take 1 tablet by mouth daily for 3 days, then decrease to only as needed for increase weight gain 07/11/21  Yes Dyann Kief, PA-C  gabapentin (NEURONTIN) 600 MG tablet Take 600 mg by mouth 3 (three) times daily.  02/04/19  Yes [provider]  HUMIRA PEN 40 MG/0.4ML PNKT Inject 40 mg as directed every 14 (fourteen) days. 04/09/21  Yes [provider]   hydrALAZINE (APRESOLINE) 100 MG tablet Take 1 tablet (100 mg total) by mouth 3 (three) times daily. 06/08/21  Yes Lilland, Alana, DO  losartan (COZAAR) 100 MG tablet Take 1 tablet (100 mg total) by mouth daily. 06/14/21  Yes Quintella Reichert, MD  methotrexate 2.5 MG tablet Take 15 mg by mouth every Friday. 06/22/20  Yes [provider]  montelukast (SINGULAIR) 10 MG tablet Take 1 tablet (10 mg total) by mouth at bedtime. 05/31/21  Yes Betancourt, Jarold Song, NP  nebivolol (BYSTOLIC) 5 MG tablet Take 2 tablets (10 mg total) by mouth at bedtime. 06/08/21  Yes Lilland, Alana, DO  Potassium Chloride ER 20 MEQ TBCR Take 20 mEq by mouth in the morning and at bedtime. 06/08/21  Yes Lilland, Alana, DO  predniSONE (DELTASONE) 5 MG tablet Take 5 mg by mouth daily. 01/29/20  Yes [provider]  sildenafil (REVATIO) 20 MG tablet Take 20 mg by mouth daily as needed (erectile dysfunction). 08/03/20  Yes [provider]  spironolactone (ALDACTONE) 25 MG tablet Take 0.5 tablets (12.5 mg total) by mouth daily. 05/31/21  Yes Maness, Loistine Chance, MD    Inpatient Medications: Scheduled Meds:  atorvastatin  40 mg Oral Daily   gabapentin  600 mg Oral TID   losartan  100 mg Oral Daily   [START ON 08/04/2021] methotrexate  15 mg Oral Q Fri   mometasone-formoterol  2 puff Inhalation BID   montelukast  10 mg Oral QHS   potassium chloride SA  20 mEq Oral Daily   predniSONE  5 mg Oral Q breakfast   propranolol  10 mg Oral TID   spironolactone  12.5 mg Oral Daily   Continuous Infusions:  PRN Meds: acetaminophen, hydrALAZINE  Allergies:    Allergies  Allergen Reactions   Amlodipine Besylate Swelling    Leg swelling with  daily am dosing; decreased with BID  dosing   Aspirin Itching    Social History:   Social History   Socioeconomic History   Marital status: Married    Spouse name: Not on file   Number of children: Not on file   Years of education: Not on file   Highest education  level: Not on file  Occupational History   Not on file  Tobacco Use   Smoking status: Never   Smokeless tobacco: Never  Vaping Use  Vaping Use: Never used  Substance and Sexual Activity   Alcohol use: Yes    Alcohol/week: 5.0 - 7.0 standard drinks    Types: 5 - 7 Cans of beer per week   Drug use: Never   Sexual activity: Not on file  Other Topics Concern   Not on file  Social History Narrative   Not on file   Social Determinants of Health   Financial Resource Strain: Not on file  Food Insecurity: Not on file  Transportation Needs: Not on file  Physical Activity: Not on file  Stress: Not on file  Social Connections: Not on file  Intimate Partner Violence: Not on file    Family History:   Family History  Problem Relation Age of Onset   Cardiomyopathy Mother    Heart attack Father      ROS:  Please see the history of present illness.  All other ROS reviewed and negative.     Physical Exam/Data:   Vitals:   08/02/21 2347 08/03/21 0300 08/03/21 0347 08/03/21 0746  BP: (!) 143/99 (!) 151/92 (!) 146/91 (!) 155/97  Pulse: 65 62 (!) 53   Resp: Temp: 97.6 F (36.4 C)  97.7 F (36.5 C) 98.1 F (36.7 C)  TempSrc: Oral  Oral Oral  SpO2: 96% 91% 95%   Weight: 114.8 kg     Height:  (1.803 m)       Intake/Output Summary (Last 24 hours) at 08/03/2021 0831 Last data filed at 08/03/2021 0800 Gross per 24 hour  Intake 240 ml  Output 500 ml  Net -260 ml   Last 3 Weights 08/02/2021 08/02/2021 07/20/2021  Weight (lbs) 253 lb 1.4 oz 253 lb 8.5 oz 254 lb  Weight (kg) 114.8 kg 115 kg 115.214 kg     Body mass index is 35.3 kg/m.  General:  Well nourished, well developed, in no acute distress HEENT: normal Lymph: no adenopathy Neck: no JVD Endocrine:  No thryomegaly Vascular: No carotid bruits  Cardiac:  RRR; no murmurs, gallops or rubs Lungs:  CTA b/l, no wheezing, rhonchi or rales  Abd: soft, nontender, obese Ext: trace edema (he reports is his  baseline) Musculoskeletal:  No deformities Skin: warm and dry  Neuro:  no focal abnormalities noted Psych:  Normal affect   EKG:  The EKG was personally reviewed and demonstrates:    SR 69bpm, RBBB (known for  him)  Telemetry:  Telemetry was personally reviewed and demonstrates:   SR, he has intermittently frequent ectopy, PVCs perhaps of 2 morphologies  Relevant CV Studies:  Monitor 8/7-15/2022 Predominant rhythm was normal sinus rhythm with average heart rate 65bpm and ranged from 42 to 119bpm. Frequent PVCs, ventricular couplets, bigminal PVCs - PVC load 12.6%. Occasional PACs and atrial couplets and triplets Ventricular tachycardia lasting 5 minutes and 11 seconds at 184bpm. Possible atrial fibrillation for 9 minutes and 42 seconds Patient was symptomatic with afib and VT.    05/29/21: R/LHC Prox RCA lesion is 20% stenosed. Prox Cx to Mid Cx lesion is 20% stenosed. Mid LAD lesion is 30% stenosed.   Mild non-obstructive CAD Normal right and left heart pressures   Recommendations: Medical management of mild CAD. Will resume IV heparin 6 hours post sheath pull given acute PE. Would transition to Eliquis or Xarelto tomorrow   NOTE by Dr. Mayford Knife 05/29/21 reads: "His SOB is far out of proportion to his very small PE.  Minimal CAD on cath and normal filling pressures  on RHC with no pulmonary HTN.  RAP , PAP 25/97mmHg with mean PAP , mean PCWP and LVEDP and therefore not volume overloaded.    I personally reviewed the echo images and I feel that his RV is mildly dilated but RVF is normal and LVF is also normal.  I do not think his SOB is cardiac related.  Agree with placing on DOAC for PE.  Recommend Pulmonary consult"  05/26/21: TTE (done during hospitalization for acute PE) IMPRESSIONS   1. Left ventricular ejection fraction, by estimation, is 45 to 50%. The  left ventricle has mildly decreased function. The left ventricle has no  regional wall motion  abnormalities. There is severe asymmetric left  ventricular hypertrophy. Left ventricular   diastolic parameters are consistent with Grade I diastolic dysfunction  (impaired relaxation).   2. Right ventricular systolic function is moderately reduced. The right  ventricular size is moderately enlarged. There is moderately elevated  pulmonary artery systolic pressure.   3. Left atrial size was moderately dilated.   4. Right atrial size was mildly dilated.   5. The mitral valve is normal in structure. Mild mitral valve  regurgitation.   6. The aortic valve is normal in structure. Aortic valve regurgitation is  not visualized. No aortic stenosis is present.   7. Aortic dilatation noted. There is mild dilatation of the ascending  aorta, measuring 41 mm.     02/08/21: TTE  1. Left ventricular ejection fraction, by estimation, is 60 to 65%. The  left ventricle has normal function. The left ventricle has no regional  wall motion abnormalities. There is moderate left ventricular hypertrophy.  Left ventricular diastolic  parameters are indeterminate.   2. Right ventricular systolic function is normal. The right ventricular  size is mildly enlarged. Tricuspid regurgitation signal is inadequate for  assessing PA pressure.   3. Left atrial size was moderately dilated.   4. Right atrial size was moderately dilated.   5. The mitral valve is normal in structure. Trivial mitral valve  regurgitation.   6. The aortic valve is tricuspid. Aortic valve regurgitation is not  visualized. No aortic stenosis is present.   7. Aortic dilatation noted. Aneurysm of the aortic root, measuring 46 mm.  There is dilatation of the ascending aorta, measuring 41 mm.   8. The inferior vena cava is normal in size with greater than 50%  respiratory variability, suggesting right atrial pressure of 3 mmHg.   10/07/2018: EPS/Ablation (Dr. Ladona Ridgel) Conclusion: Successful EP study and catheter ablation of symptomatic PVCs  originating from the RV inflow tract.  Successful ablation occurred just below the AV node and just apical of the His bundle region.  During RF energy application at the successful site, there was transient accelerated junctional rhythm.  However no heart block was demonstrated   Laboratory Data:  High Sensitivity Troponin:   Recent Labs  Lab 08/02/21 2116 08/03/21 0016  TROPONINIHS 11 13     Chemistry Recent Labs  Lab 08/02/21 2116  NA 136  K 3.8  CL 107  CO2 21*  GLUCOSE 133*  BUN 22  CREATININE 1.10  CALCIUM 9.1  GFRNONAA >60  ANIONGAP 8    Recent Labs  Lab 08/02/21 2116  PROT 6.1*  ALBUMIN 3.7  AST 20  ALT 26  ALKPHOS 71  BILITOT 0.7   Hematology Recent Labs  Lab 08/02/21 2116  WBC 5.5  RBC 3.89*  HGB 13.4  HCT 40.5  MCV 104.1*  MCH 34.4*  MCHC 33.1  RDW 15.3  PLT 168   BNP Recent Labs  Lab 08/02/21 2201  BNP 50.5    DDimer No results for input(s): DDIMER in the last 168 hours.   Radiology/Studies:  DG Chest 2 View Result Date: 08/02/2021 CLINICAL DATA:  Shortness of breath EXAM: CHEST - 2 VIEW COMPARISON:  05/28/2021 FINDINGS: Cardiomegaly. Linear atelectasis or scarring. No overt edema or effusions. No acute bony abnormality. IMPRESSION: Bibasilar atelectasis or scarring. Cardiomegaly. No active disease. Electronically Signed   By: Charlett Nose M.D.   On: 08/02/2021 21:29     Assessment and Plan:   WCT is c/w VT + pt triggered episode, but no symptoms of near syncope or syncope Mostly palpitations with all his pt triggered episodes, some brief CP  2. AFib New diagnosis for him CHA2DS2Vasc is 78 (age, HTN, PE history and mild NOD) Already on eliquis for his PE  3. PVCs 12% burden   He has not had syncope or near syncope Updated echo is pending BB has been changed on admission to propanolol  I am not certain that we need to think about ICD yet Echo clarification of his EF, LVH (described on last echo as severe asymmetric LVH, no  description of obstruction IVS measure 94mm, PW 42mm) Agree with mention of cMRI I will let him eat and resume his eliquis for now  ADDEND Dr. Johney Frame has seen the patient and reviewed his monitor Discussed case with Dr. Ladona Ridgel Will get C.MRI pending the findings for final recommendations Verapamil and propanolol if low risk MRI, if high risk findings though would consider ICD   Risk Assessment/Risk Scores:     For questions or updates, please contact [facility] Please consult [email or web address] for contact info under    Signed, Sheilah Pigeon, PA-C  08/03/2021 8:31 AM   I have seen, examined the patient, and reviewed the above assessment and plan.  Changes to above are made where necessary.  On exam, RRR.  The patient has had prior ventricular ectopy and is s/p ablation by Dr Ladona Ridgel.  I suspect that VT seen on event monitor may have been RVOT in origin. We will obtain cardiac MRI to exclude ARVD.  If MRI is low risk, then I would not advise ICD given that he has not had syncope and was minimally symptomatic/ hemodynamically stable.  I have spoken with Dr Mayford Knife and Dr Ladona Ridgel regarding this plan.  Dr Ladona Ridgel to follow-up tomorrow.   Co Sign: Hillis Range, MD 08/03/2021 4:11 PM

## 2021-08-03 NOTE — Progress Notes (Signed)
Echocardiogram 2D Echocardiogram has been performed.  Chrisma Hurlock F Jenipher Havel RDCS 08/03/2021, 1:06 PM 

## 2021-08-03 NOTE — H&P (Signed)
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   History of Present Illness    He has a history of frequent PVCs and underwent ablation of a PVC originating from RV inflow near the His bundle in 2019. He has had recurrence of the PVCs and associated pseudobradycardia. He continued to report bradycardia in the 30s and 40s at his last clinic visit so a 1 week monitor was placed to assess since he had also been restarted on diltiazem and Bystolic. He was called today regarding the results of the monitor which showed a 5 minute run of monomorphic VT with a rate up to 184 bpm. He also had at least 9-10 min of likely atrial fibrillation and PVC burden of 12.6%. Due to the VT he was told to come in to the hospital.  On arrival here he reports that he occasionally has a feeling in the base of his neck that he has trouble describing, but feels like maybe a tightening and fluttering sensation. This can last up to 10 min. He did have at least one of these episodes during the monitoring period for which he triggered an event. He denies any syncope or exertional chest pain, but does continue to have significant exertional dyspnea which is chronic and overall unchanged - work-up for this is ongoing. He denies any recent weight  gain, swelling, or orthopnea. Reports compliance with CPAP.Marland Kitchen Troponin and BNP sent in the ED were both normal and labs otherwise are unrevealing. ECG shows a SR with first degree AVB and a wide fractionated RBBB, previously demonstrated, but no acute changes. As of 5am, he has not had any further VT or AF while on telemetry here.  Past Medical History   Past Medical History:  Diagnosis Date   Ascending aortic aneurysm ([place])    a. CT 05/2021 showed 4.4x4.4cm TAA.   Bilateral lower extremity edema    Chronic gout without tophus    followed by dr Gerilyn Nestle    (06-08-2020  per pt last episode right knee 3 wks ago)   CKD (chronic kidney disease), stage II    nephrology--- Jen Mow PA (10-29-2019 note in epic scanned in  media)   Heart failure with mid-range ejection fraction [facility]) 05/27/2021   05/26/21: Left ventricular ejection fraction, by estimation, is 45 to 50%. There is severe asymmetric left ventricular hypertrophy. G1DD present.    History of COVID-19 06/07/2021   Hypertension    followed by cardiology, dr t. turner   (05-14-2018 nuclear study in epic , normal perfusion with nuclear ef 61%)   Left hydrocele    Low serum potassium level 04/27/2014   OSA on CPAP    per pt uses every night   Palpitations followed by dr t. [name]   06-08-2020  still feels palipations due to PVCs when exertion but not  with chest pain/ discomfort   Pneumonia due to COVID-19 virus    PVC's (premature ventricular contractions) cardiologist--- dr t. turner   Status post PVC ablation by [name] 2019 with recurrence of frequent PVCs/bigeminy;  prior pseudobradycardia r/t pvcs   Rheumatoid arthritis involving multiple sites [facility])    rheumotology--- dr a. Gerilyn Nestle  ([facility] in [place])    Past Surgical History:  Procedure Laterality Date   HYDROCELE EXCISION Left 06/14/2020   Procedure: LEFT  HYDROCELECTOMY ADULT;  Surgeon: Robley Fries, MD;  Location: [facility];  Service: Urology;   Laterality: Left;   INCISIONAL HERNIA REPAIR  02-23-2016   _0    LAPAROSCOPIC   LAPAROSCOPIC INGUINAL HERNIA REPAIR Bilateral 08-22-2015  <[id number]_1<\/[id number]>_4    AND UMBILICAL HERNIA REPAIR   PVC ABLATION N/A 10/07/2018   Procedure: PVC ABLATION;  Surgeon: Evans Lance, MD;  Location: [facility] [facility];  Service: Cardiovascular;  Laterality: N/A;   RIGHT/LEFT HEART CATH AND CORONARY ANGIOGRAPHY N/A 05/29/2021   Procedure: RIGHT/LEFT HEART CATH AND CORONARY ANGIOGRAPHY;  Surgeon: Burnell Blanks, MD;  Location: [place] [facility];  Service: Cardiovascular;  Laterality: N/A;   UMBILICAL HERNIA REPAIR  child     Allergies Allergies  Allergen Reactions   Amlodipine Besylate Swelling    Leg swelling with 42m daily am dosing; decreased with BID 548mdosing   Aspirin Itching    Home Medications    Prior to Admission medications   Medication Sig Start Date End Date Taking? Authorizing Provider  acetaminophen-codeine (TYLENOL #3) 300-30 MG tablet Take 1-2 tablets by mouth as needed for moderate pain. 05/06/19  Yes [provider]  albuterol (VENTOLIN HFA) 108 (90 Base) MCG/ACT inhaler Inhale 2 puffs into the lungs every 4 (four) hours as needed for wheezing or shortness of breath. 03/02/21  Yes Betancourt, TiAura FeyNP  apixaban (ELIQUIS) 5 MG TABS tablet Take 1 tablet (5 mg total) by mouth 2 (two) times daily. 06/17/21  Yes Parrett, Tammy S, NP  atorvastatin (LIPITOR) 40 MG tablet Take 1 tablet (40 mg total) by mouth daily. 01/09/21  Yes Turner, TrEber HongMD  chlorthalidone (HYGROTON) 25 MG tablet Take 25 mg by mouth daily. 04/02/21  Yes [provider]  colchicine 0.6 MG tablet Take 1-2 tablets by mouth daily as needed (gout).  01/31/16  Yes [provider]  diltiazem (CARDIZEM CD) 180 MG 24 hr capsule Take 1 capsule (180 mg total) by mouth daily. 02/27/21  Yes Turner, TrEber HongMD  fluticasone (FLONASE) 50 MCG/ACT nasal spray Place 1 spray into both nostrils daily. 06/08/21  Yes  Lilland, Alana, DO  Fluticasone-Salmeterol (ADVAIR) 250-50 MCG/DOSE AEPB Inhale 1 puff into the lungs every 12 (twelve) hours. 11/28/20  Yes DeFreddi StarrMD  folic acid (FOLVITE) 1 MG tablet Take 1 mg by mouth daily. 01/29/20  Yes [provider]  furosemide (LASIX) 40 MG tablet Take 1 tablet (40 mg total) by mouth as needed. Take 1 tablet by mouth daily for 3 days, then decrease to only as needed for increase weight gain 07/11/21  Yes LeImogene BurnPA-C  gabapentin (NEURONTIN) 600 MG tablet Take 600 mg by mouth 3 (three) times daily.  02/04/19  Yes [provider]  HUMIRA PEN 40 MG/0.4ML PNKT Inject 40 mg as directed every 14 (fourteen) days. 04/09/21  Yes [provider]  hydrALAZINE (APRESOLINE) 100 MG tablet Take 1 tablet (100 mg total) by mouth 3 (three) times daily. 06/08/21  Yes Lilland,  Alana, DO  losartan (COZAAR) 100 MG tablet Take 1 tablet (100 mg total) by mouth daily. 06/14/21  Yes Sueanne Margarita, MD  methotrexate 2.5 MG tablet Take 15 mg by mouth every Friday. 06/22/20  Yes [provider]  montelukast (SINGULAIR) 10 MG tablet Take 1 tablet (10 mg total) by mouth at bedtime. 05/31/21  Yes Betancourt, Aura Fey, NP  nebivolol (BYSTOLIC) 5 MG tablet Take 2 tablets (10 mg total) by mouth at bedtime. 06/08/21  Yes Lilland, Alana, DO  Potassium Chloride ER 20 MEQ TBCR Take 20 mEq by mouth in the morning and at bedtime. 06/08/21  Yes Lilland, Alana, DO  predniSONE (DELTASONE) 5 MG tablet Take 5 mg by mouth daily. 01/29/20  Yes [provider]  sildenafil (REVATIO) 20 MG tablet Take 20 mg by mouth daily as needed (erectile dysfunction). 08/03/20  Yes [provider]  spironolactone (ALDACTONE) 25 MG tablet Take 0.5 tablets (12.5 mg total) by mouth daily. 05/31/21  Yes Maness, Arnette Norris, MD    Family History    Family History  Problem Relation Age of Onset   Cardiomyopathy Mother    Heart attack Father    He indicated that his mother is  deceased. He indicated that his father is deceased. He indicated that his maternal grandmother is deceased. He indicated that his maternal grandfather is deceased. He indicated that his paternal grandmother is deceased. He indicated that his paternal grandfather is deceased.   Social History    Social History   Socioeconomic History   Marital status: Married    Spouse name: Not on file   Number of children: Not on file   Years of education: Not on file   Highest education level: Not on file  Occupational History   Not on file  Tobacco Use   Smoking status: Never   Smokeless tobacco: Never  Vaping Use   Vaping Use: Never used  Substance and Sexual Activity   Alcohol use: Yes    Alcohol/week: 5.0 - 7.0 standard drinks    Types: 5 - 7 Cans of beer per week   Drug use: Never   Sexual activity: Not on file  Other Topics Concern   Not on file  Social History Narrative   Not on file   Social Determinants of Health   Financial Resource Strain: Not on file  Food Insecurity: Not on file  Transportation Needs: Not on file  Physical Activity: Not on file  Stress: Not on file  Social Connections: Not on file  Intimate Partner Violence: Not on file     Review of Systems    General:  No chills, fever, night sweats or weight changes.  Cardiovascular:  No chest pain, dyspnea on exertion, edema, orthopnea, palpitations, paroxysmal nocturnal dyspnea. Dermatological: No rash, lesions/masses Respiratory: No cough, dyspnea Urologic: No hematuria, dysuria Abdominal:   No nausea, vomiting, diarrhea, bright red blood per rectum, melena, or hematemesis Neurologic:  No visual changes, wkns, changes in mental status. All other systems reviewed and are otherwise negative except as noted above.  Physical Exam    BP (!) 143/99 (BP Location: Right Arm)   Pulse 65   Temp 97.6 F (36.4 C) (Oral)   Resp 15   Ht _0  (1.803 m)   Wt 114.8 kg   SpO2 96%   BMI 35.30 kg/m  General: Alert,  NAD HEENT: Normal  Neck: No bruits or JVD. Lungs:  Resp regular and unlabored, CTA bilaterally. Heart: Regular rhythm, no s3, s4,  or murmurs. Abdomen: Soft, non-tender, non-distended, BS +.  Extremities: Warm. No clubbing, cyanosis or edema. DP/PT/Radials 2+ and equal bilaterally. Psych: Normal affect. Neuro: Alert and oriented. No gross focal deficits. No abnormal movements.  Labs    Troponin (Point of Care Test) No results for input(s): TROPIPOC in the last 72 hours. No results for input(s): CKTOTAL, CKMB, TROPONINI in the last 72 hours. Lab Results  Component Value Date   WBC 5.5 08/02/2021   HGB 13.4 08/02/2021   HCT 40.5 08/02/2021   MCV 104.1 (H) 08/02/2021   PLT 168 08/02/2021    Recent Labs  Lab 08/02/21 2116  NA 136  K 3.8  CL 107  CO2 21*  BUN 22  CREATININE 1.10  CALCIUM 9.1  PROT 6.1*  BILITOT 0.7  ALKPHOS 71  ALT 26  AST 20  GLUCOSE 133*   Lab Results  Component Value Date   CHOL 123 05/27/2021   HDL 49 05/27/2021   LDLCALC 61 05/27/2021   TRIG 67 05/27/2021   Lab Results  Component Value Date   DDIMER 0.30 02/02/2021     Radiology Studies    DG Chest 2 View  Result Date: 08/02/2021 CLINICAL DATA:  Shortness of breath EXAM: CHEST - 2 VIEW COMPARISON:  05/28/2021 FINDINGS: Cardiomegaly. Linear atelectasis or scarring. No overt edema or effusions. No acute bony abnormality. IMPRESSION: Bibasilar atelectasis or scarring. Cardiomegaly. No active disease. Electronically Signed   By: Rolm Baptise M.D.   On: 08/02/2021 21:29   LONG TERM MONITOR (3-14 DAYS)  Result Date: 08/02/2021  Predominant rhythm was normal sinus rhythm with average heart rate 65bpm and ranged from 42 to 119bpm.  Frequent PVCs, ventricular couplets, bigminal PVCs - PVC load 12.6%.  Occasional PACs and atrial couplets and triplets  Ventricular tachycardia lasting 5 minutes and 11 seconds at 184bpm.  Possible atrial fibrillation for 9 minutes and 42 seconds  Patient was  symptomatic with afib and VT.  Patch Wear Time:  7 days and 9 hours (2022-08-07T21:26:23-0400 to 2022-08-15T06:29:36-0400) Patient had a min HR of 42 bpm, max HR of 184 bpm, and avg HR of 64 bpm. Predominant underlying rhythm was Sinus Rhythm. Bundle Branch Block/IVCD was present. 1 run of Ventricular Tachycardia occurred lasting 5 mins 11 secs with a max rate of 184 bpm (avg 159 bpm). Atrial Fibrillation occurred (<1% burden), ranging from 90-148 bpm (avg of 116 bpm), the longest lasting 9 mins 42 secs with an avg rate of 116 bpm. True duration of Atrial Fibrillation episodes difficult to ascertain due to the presence of artifact. Ventricular Tachycardia and Atrial Fibrillation were detected within +/- 45 seconds of symptomatic patient event(s). Isolated SVEs were occasional (1.1%, 6480), SVE Couplets were rare (<1.0%, 276), and SVE Triplets were rare (<1.0%, 32). Isolated VEs were frequent (12.6%, 77054), VE Couplets were rare (<1.0%, 1495), and VE Triplets were rare (<1.0%, 238). Ventricular Bigeminy and Trigeminy were present. MD notification criteria for Ventricular Tachycardia  met - report posted prior to notification per account request (MD).    ECG & Cardiac Imaging    ECG: SR with first degree AVB, wide fractionated RBBB, left atrial enlargement - personally reviewed.  Recent Echocardiogram and right/left heart cath personally reviewed - see EMR.   Assessment & Plan    Sustained monomorphic ventricular tachycardia History of frequent PVCs s/p ablation His recent coronary angiography showed no obstructive CAD and he does not have anginal chest pain, so this is unlikely ischemic in origin. He has never had  imaging for scar, which may be reasonable give recent question of a slight decrease in his LV function. Unable to localize the VT due to single lead strip. Prior PVCs were RV origin near his bundle. Though VT was sustained for ~ 5 min and likely related to his reported symptoms, they do not  suggest an overtly unstable VT. Further evaluation may be warranted prior to ICD implantation - would involve EP - Consult EP in the morning - previous ablation by [name] - Repeat TTE ordered, but would also consider CMR to characterize any scar and better quantitate LV function - NPO pending decision for ICD - Bystolic and diltiazem held - started on propranolol in the interim - Continuous monitoring - no further VT thus far in-house  Paroxysmal AF. Due to presence of wide RBBB and short R-R, a little difficult to definitively confirm on single-lead monitor with at least some degree of baseline artifact, but this seems to be most likely AF. Overall burden is relatively low at 9 min over 1 week. CHADS2-VASc is at least 2, which likely underestimates his stroke risk. He has been on anticoagulation for PE and also already on CCB and BB for the PVCs. I would favor indefinite anticoagulation.  Recent pulmonary embolism, intermediate-risk: found to have segmental PE in the setting of new dyspnea and hypoxemia in June. TTE at the time per my review showed severe RV dilation and was read as mildly reduced LVEF but this was subsequently determined to be normal (unable to get images to play to review). Currently, he is compensated and euvolemic. - Will obtain follow-up echo, esp given new diagnosis of VT - Hold Eliquis for possible procedures   Chronic exertional hypoxemia: being evaluated by pulmonology - refer to recent consult. He is oxygenating well on room air while at rest. - 2L with exertion and 3L at night with CPAP - Will add bubble study to echo per pulmonology outpatient consult note - Dulera substituted for non-formulary inhaler while admitted  Hypertension: currently hypertensive. Requires multidrug regimen. - Continue losartan 15m and Aldactone 12.580m - Hold diltiazem and Bystolic and start propranolol as above - Will change hydralazine 10034mID to 104m49mD prn BP > 180 in case we  need BP room acutely overnight for additional AADs or sedation for cardioversion.  OSA on CPAP, compliant: - CPAP at night with 3L O2  CKD stage II - Renal function at baseline. Continue ARB and MRA  Rheumatoid arthritis. - continue prednisone. Should not be due for Humira during this admission  Chronic gout  - colchicine prn per home regimen   Nutrition: NPO DVT ppx: SCDs Advanced Care Planning: Full Code   Signed, ScotMarykay Lex 08/03/2021, 12:19 AM

## 2021-08-04 ENCOUNTER — Inpatient Hospital Stay (HOSPITAL_COMMUNITY): Payer: No Typology Code available for payment source

## 2021-08-04 DIAGNOSIS — I472 Ventricular tachycardia: Secondary | ICD-10-CM

## 2021-08-04 IMAGING — MR MR CARD MORPHOLOGY WO/W CM
45 of 48 series · 45 of 48 positions shown · IV contrast (Contrast agent)
Comparison: none

CLINICAL DATA: 65M with history of frequent PVCs s/p ablation in
[28], CKD, RTOYOTA, OSA, PE p/w VT

EXAM:
CARDIAC MRI
TECHNIQUE: The patient was scanned on a 1.5 Tesla Siemens magnet. A dedicated
cardiac coil was used. Functional imaging was done using Fiesta
sequences. [DATE], and 4 chamber views were done to assess for RWMA's.
Modified RTOYOTA rule using a short axis stack was used to
calculate an ejection fraction on a dedicated work station using
Circle software. The patient received 10 cc of Gadavist. After 10
minutes inversion recovery sequences were used to assess for
infiltration and scar tissue.
CONTRAST:  10 cc  of Gadavist

[Series 5: [id number] · axial · 8.0mm · 1.41mm/px · 1 of 18 slices shown]
[im 1/18]
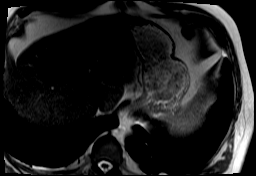

[Series 10: bSSFP · sagittal · 8.0mm · 1.79mm/px · 1 of 25 slices shown (1 of 22)]
[im 1/25]
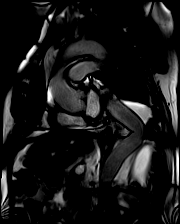

[Series 11: bSSFP · sagittal · 8.0mm · 1.79mm/px · 1 of 25 slices shown (2 of 22)]
[im 1/25]
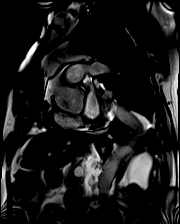

[Series 12: bSSFP · sagittal · 8.0mm · 1.79mm/px · 1 of 25 slices shown (3 of 22)]
[im 1/25]
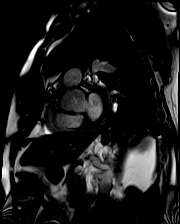

[Series 13: bSSFP · sagittal · 8.0mm · 1.79mm/px · 1 of 25 slices shown (4 of 22)]
[im 1/25]
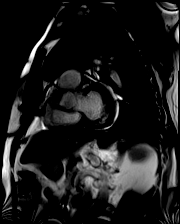

[Series 14: bSSFP · sagittal · 8.0mm · 1.79mm/px · 1 of 25 slices shown (5 of 22)]
[im 1/25]
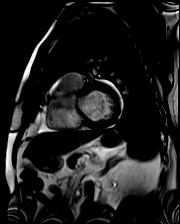

[Series 15: bSSFP · sagittal · 8.0mm · 1.79mm/px · 1 of 25 slices shown (6 of 22)]
[im 1/25]
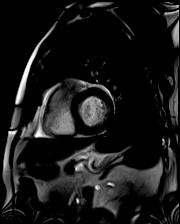

[Series 16: bSSFP · sagittal · 8.0mm · 1.79mm/px · 1 of 25 slices shown (7 of 22)]
[im 1/25]
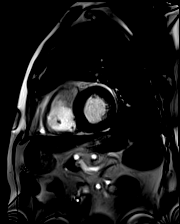

[Series 17: bSSFP · sagittal · 8.0mm · 1.79mm/px · 1 of 25 slices shown (8 of 22)]
[im 1/25]
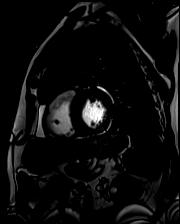

[Series 18: bSSFP · sagittal · 8.0mm · 1.79mm/px · 1 of 25 slices shown (9 of 22)]
[im 1/25]
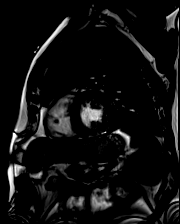

[Series 19: bSSFP · sagittal · 8.0mm · 1.79mm/px · 1 of 25 slices shown (10 of 22)]
[im 1/25]
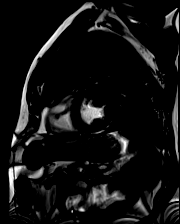

[Series 20: bSSFP · sagittal · 8.0mm · 1.79mm/px · 1 of 25 slices shown (11 of 22)]
[im 1/25]
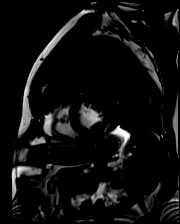

[Series 21: bSSFP · sagittal · 8.0mm · 1.79mm/px · 1 of 25 slices shown (12 of 22)]
[im 1/25]
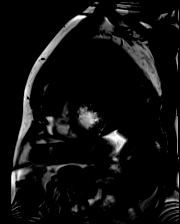

[Series 22: bSSFP · sagittal · 8.0mm · 1.79mm/px · 1 of 25 slices shown (13 of 22)]
[im 1/25]
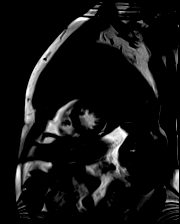

[Series 23: bSSFP · sagittal · 8.0mm · 1.79mm/px · 1 of 25 slices shown (14 of 22)]
[im 1/25]
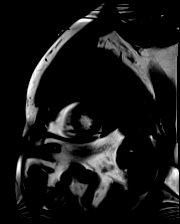

[Series 24: bSSFP · sagittal · 8.0mm · 1.79mm/px · 1 of 25 slices shown (15 of 22)]
[im 1/25]
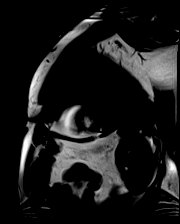

[Series 25: bSSFP · sagittal · 8.0mm · 1.79mm/px · 1 of 25 slices shown (16 of 22)]
[im 1/25]
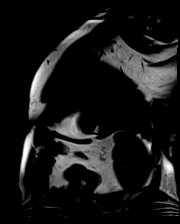

[Series 26: bSSFP · sagittal · 8.0mm · 1.79mm/px · 1 of 25 slices shown (17 of 22)]
[im 1/25]
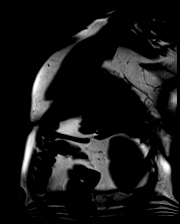

[Series 27: bSSFP · sagittal · 8.0mm · 1.79mm/px · 1 of 25 slices shown (18 of 22)]
[im 1/25]
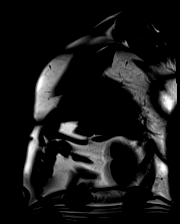

[Series 28: bSSFP · sagittal · 8.0mm · 1.79mm/px · 1 of 25 slices shown (19 of 22)]
[im 1/25]
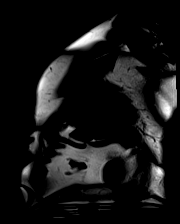

[Series 29: bSSFP · axial · 6.0mm · 1.41mm/px · 1 of 25 slices shown (20 of 22)]
[im 1/25]
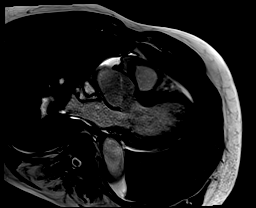

[Series 30: bSSFP · coronal · 6.0mm · 1.41mm/px · 1 of 25 slices shown (21 of 22)]
[im 1/25]
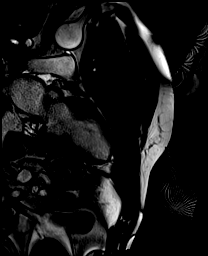

[Series 31: bSSFP · axial · 6.0mm · 1.41mm/px · 1 of 25 slices shown (22 of 22)]
[im 1/25]
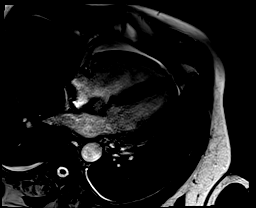

[Series 32: STIR · axial · 6.0mm · 2.02mm/px · 1 of 1 slices shown (1 of 2)]
[im 1/1]
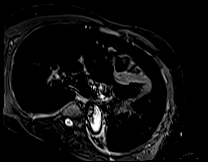

[Series 34: STIR · axial · 6.0mm · 2.02mm/px · 1 of 1 slices shown (2 of 2)]
[im 1/1]
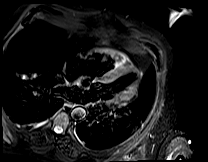

[Series 35: (id)_long_t1 · oblique · 8.0mm · 1.56mm/px · 1 of 24 slices shown]
[im 1/24]
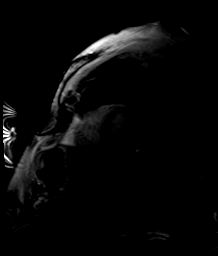

[Series 36: (id)_long_t1_moco · oblique · 8.0mm · 1.56mm/px · 1 of 24 slices shown]
[im 1/24]
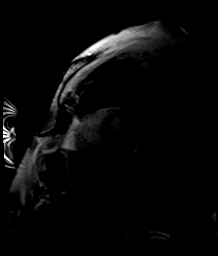

[Series 37: (id)_long_t1_moco_t1 · oblique · 8.0mm · 1.56mm/px · 1 of 6 slices shown]
[im 1/6]
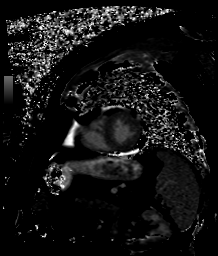

[Series 39: cine_trufi_cs_rt_short axis · oblique · 8.0mm · 1.73mm/px · 1 of 19 slices shown (1 of 17)]
[im 1/19]
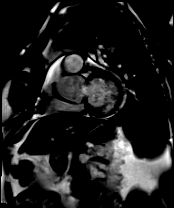

[Series 39: cine_trufi_cs_rt_short axis · oblique · 8.0mm · 1.73mm/px · 1 of 19 slices shown (2 of 17)]
[im 1/19]
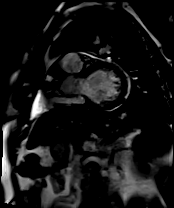

[Series 39: cine_trufi_cs_rt_short axis · oblique · 8.0mm · 1.73mm/px · 1 of 19 slices shown (3 of 17)]
[im 1/19]
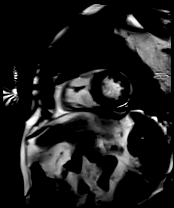

[Series 39: cine_trufi_cs_rt_short axis · oblique · 8.0mm · 1.73mm/px · 1 of 19 slices shown (4 of 17)]
[im 1/19]
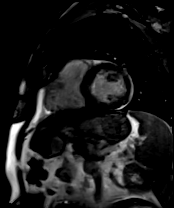

[Series 39: cine_trufi_cs_rt_short axis · oblique · 8.0mm · 1.73mm/px · 1 of 19 slices shown (5 of 17)]
[im 1/19]
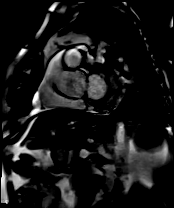

[Series 39: cine_trufi_cs_rt_short axis · oblique · 8.0mm · 1.73mm/px · 1 of 19 slices shown (6 of 17)]
[im 1/19]
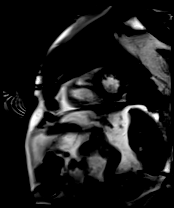

[Series 39: cine_trufi_cs_rt_short axis · oblique · 8.0mm · 1.73mm/px · 1 of 19 slices shown (7 of 17)]
[im 1/19]
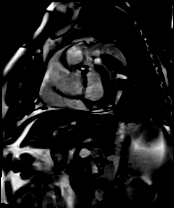

[Series 39: cine_trufi_cs_rt_short axis · oblique · 8.0mm · 1.73mm/px · 1 of 19 slices shown (8 of 17)]
[im 1/19]
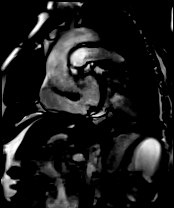

[Series 39: cine_trufi_cs_rt_short axis · oblique · 8.0mm · 1.73mm/px · 1 of 19 slices shown (9 of 17)]
[im 1/19]
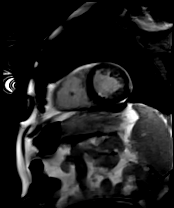

[Series 39: cine_trufi_cs_rt_short axis · oblique · 8.0mm · 1.73mm/px · 1 of 19 slices shown (10 of 17)]
[im 1/19]
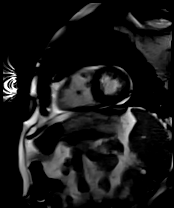

[Series 39: cine_trufi_cs_rt_short axis · oblique · 8.0mm · 1.73mm/px · 1 of 19 slices shown (11 of 17)]
[im 1/19]
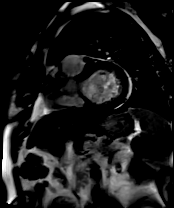

[Series 39: cine_trufi_cs_rt_short axis · oblique · 8.0mm · 1.73mm/px · 1 of 19 slices shown (12 of 17)]
[im 1/19]
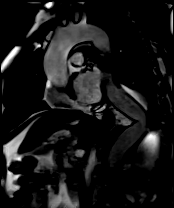

[Series 39: cine_trufi_cs_rt_short axis · oblique · 8.0mm · 1.73mm/px · 1 of 19 slices shown (13 of 17)]
[im 1/19]
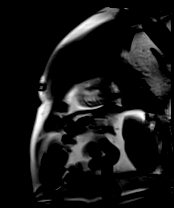

[Series 39: cine_trufi_cs_rt_short axis · oblique · 8.0mm · 1.73mm/px · 1 of 19 slices shown (14 of 17)]
[im 1/19]
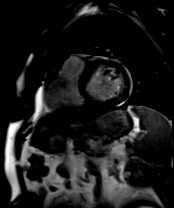

[Series 39: cine_trufi_cs_rt_short axis · oblique · 8.0mm · 1.73mm/px · 1 of 19 slices shown (15 of 17)]
[im 1/19]
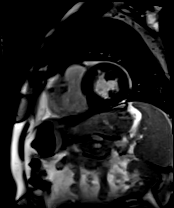

[Series 39: cine_trufi_cs_rt_short axis · oblique · 8.0mm · 1.73mm/px · 1 of 19 slices shown (16 of 17)]
[im 1/19]
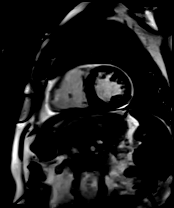

[Series 39: cine_trufi_cs_rt_short axis · oblique · 8.0mm · 1.73mm/px · 1 of 19 slices shown (17 of 17)]
[im 1/19]
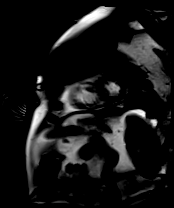

[45 of 48 positions shown; findings below may reference images not displayed]

FINDINGS: Left ventricle:

-Difficult to quantify volumes due to significant motion artifact

-Basal septum measures 18mm (10mm in basal lateral wall)

-Normal size

-Normal systolic function

-Nonspecific elevation in ECV (30%)

-Patchy LGE in RV insertion site and basal lateral wall. LGE
accounts for 1% of total myocardial mass

LV EF: 52% (Normal 56-78%)

Absolute volumes:

LV EDV: 168mL (Normal 77-195 mL)

LV ESV: 81mL (Normal 19-72 mL)

LV SV: 87mL (Normal 51-133 mL)

CO: 4.3L/min (Normal 2.8-8.8 L/min)

Indexed volumes:

LV EDV: 70mL/sq-m (Normal 47-92 mL/sq-m)

LV ESV: 34mL/sq-m (Normal 13-30 mL/sq-m)

LV SV: 36mL/sq-m (Normal 32-62 mL/sq-m)

CI: 1.8L/min/sq-m (Normal 1.7-4.2 L/min/sq-m)

Right ventricle: Normal size with mild systolic dysfunction

RV EF:  45% (Normal 47-74%)

Absolute volumes:

RV EDV: 182mL (Normal 88-227 mL)

RV ESV: 100mL (Normal 23-103 mL)

RV SV: 83mL (Normal 52-138 mL)

CO: 4.1L/min (Normal 2.8-8.8 L/min)

Indexed volumes:

RV EDV: 76mL/sq-m (Normal 55-105 mL/sq-m)

RV ESV: 42mL/sq-m (Normal 15-43 mL/sq-m)

RV SV: 35mL/sq-m (Normal 32-64 mL/sq-m)

CI: 1.7L/min/sq-m (Normal 1.7-4.2 L/min/sq-m)

Left atrium: Normal size

Right atrium: Normal size

Mitral valve: No significant regurgitation

Aortic valve: No significant regurgitation

Tricuspid valve: No significant regurgitation

Pulmonic valve: No significant regurgitation

Aorta: Dilated ascending aorta measuring 43mm

Pericardium: Small effusion
IMPRESSION: 1. Asymmetric LV hypertrophy measuring 18mm in basal septum (10mm in
basal lateral wall). This meets criteria for hypertrophic
cardiomyopathy

2. Patchy late gadolinium enhancement at RV insertion site and basal
lateral wall. LGE accounts for 1% of total myocardial mass. While
this LGE pattern is consistent with HCM, would also consider Fabry
disease as it can present with basal lateral LGE and LVH. Would
recommend checking alpha-galactosidase A level

3. Normal LV size and systolic function (EF 52%), though was
difficult to quantify volumes/EF due to significant motion artifact
during acquisition

4.  Normal RV size with mild systolic dysfunction (EF 45%)

5.  Small pericardial effusion

6.  Dilated ascending aorta measuring 43mm

## 2021-08-04 MED ORDER — METHOTREXATE 2.5 MG PO TABS
15.0000 mg | ORAL_TABLET | ORAL | Status: DC
  Administered 2021-08-06: 15 mg via ORAL
  Filled 2021-08-04: qty 6

## 2021-08-04 MED ORDER — GADOBUTROL 1 MMOL/ML IV SOLN
10.0000 mL | Freq: Once | INTRAVENOUS | Status: AC | PRN
  Administered 2021-08-04: 10 mL via INTRAVENOUS

## 2021-08-04 MED ORDER — VERAPAMIL HCL ER 180 MG PO TBCR
180.0000 mg | EXTENDED_RELEASE_TABLET | Freq: Every day | ORAL | Status: DC
  Administered 2021-08-04 – 2021-08-08 (×5): 180 mg via ORAL
  Filled 2021-08-04 (×5): qty 1

## 2021-08-04 NOTE — Telephone Encounter (Signed)
Mr. Triggs called and said that [employer] notified him they needed additional information to process the disability claim.  He is currently in the hospital and I let him know I would call the ins. company.  I called [employer] and found out they are missing ov notes from 8/1 to now.  I have faxed 8/11 notes to [phone].  I attempted to call Mr. Holan back, he does not have voice mail so I could not leave a mgs.

## 2021-08-04 NOTE — Progress Notes (Addendum)
Progress Note  Patient Name: Ryan Glass Date of Encounter: 08/04/2021  [facility] Cardiologist: [name], MD  EP: Dr. Ladona Ridgel  Subjective   Doing OK, hopes to go home  Inpatient Medications    Scheduled Meds:  apixaban  5 mg Oral BID   atorvastatin  40 mg Oral Daily   gabapentin  600 mg Oral TID   losartan  100 mg Oral Daily   methotrexate  15 mg Oral Q Fri   mometasone-formoterol  2 puff Inhalation BID   montelukast  10 mg Oral QHS   potassium chloride SA  20 mEq Oral Daily   predniSONE  5 mg Oral Q breakfast   propranolol  10 mg Oral TID   spironolactone  12.5 mg Oral Daily   Continuous Infusions:  PRN Meds: acetaminophen, hydrALAZINE   Vital Signs    Vitals:   08/03/21 2300 08/03/21 2324 08/04/21 0300 08/04/21 0335  BP:  (!) 153/103  (!) 152/99  Pulse:  60  (!) 57  Resp: 17 19 16 17   Temp:  97.6 F (36.4 C)  97.6 F (36.4 C)  TempSrc:  Oral  Oral  SpO2:  97%  96%  Weight:      Height:        Intake/Output Summary (Last 24 hours) at 08/04/2021 0741 Last data filed at 08/03/2021 1927 Gross per 24 hour  Intake 240 ml  Output 800 ml  Net -560 ml   Last 3 Weights 08/02/2021 08/02/2021 07/20/2021  Weight (lbs) 253 lb 1.4 oz 253 lb 8.5 oz 254 lb  Weight (kg) 114.8 kg 115 kg 115.214 kg      Telemetry    SR, occ- at times more frequent PVCs - Personally Reviewed  ECG    No new EKGs - Personally Reviewed  Physical Exam   GEN: No acute distress.   Neck: No JVD Cardiac: RRR, no murmurs, rubs, or gallops.  Respiratory: CTA b/l. GI: Soft, nontender, non-distended  MS: No edema; No deformity. Neuro:  Nonfocal  Psych: Normal affect   Labs    High Sensitivity Troponin:   Recent Labs  Lab 08/02/21 2116 08/03/21 0016  TROPONINIHS 11 13      Chemistry Recent Labs  Lab 08/02/21 2116  NA 136  K 3.8  CL 107  CO2 21*  GLUCOSE 133*  BUN 22  CREATININE 1.10  CALCIUM 9.1  PROT 6.1*  ALBUMIN 3.7  AST 20  ALT 26  ALKPHOS 71   BILITOT 0.7  GFRNONAA >60  ANIONGAP 8     Hematology Recent Labs  Lab 08/02/21 2116  WBC 5.5  RBC 3.89*  HGB 13.4  HCT 40.5  MCV 104.1*  MCH 34.4*  MCHC 33.1  RDW 15.3  PLT 168    BNP Recent Labs  Lab 08/02/21 2201  BNP 50.5     DDimer No results for input(s): DDIMER in the last 168 hours.   Radiology      Cardiac Studies   08/03/21: TTE IMPRESSIONS   1. Left ventricular ejection fraction, by estimation, is 60 to 65%. The  left ventricle has normal function. The left ventricle has no regional  wall motion abnormalities. The left ventricular internal cavity size was  mildly dilated. There is mild left  ventricular hypertrophy. Left ventricular diastolic parameters are  consistent with Grade I diastolic dysfunction (impaired relaxation).   2. Right ventricular systolic function is mildly reduced. The right  ventricular size is mildly enlarged.   3. Left  atrial size was mildly dilated.   4. The mitral valve is normal in structure. No evidence of mitral valve  regurgitation. No evidence of mitral stenosis.   5. The aortic valve is tricuspid. Aortic valve regurgitation is not  visualized. Mild aortic valve sclerosis is present, with no evidence of  aortic valve stenosis.   6. Aortic dilatation noted. There is moderate dilatation of the aortic  root, measuring 46 mm. There is mild dilatation of the ascending aorta,  measuring 40 mm.   7. Agitated saline contrast bubble study was positive with shunting  observed within 3-6 cardiac cycles suggestive of interatrial shunt.    Monitor 8/7-15/2022 Predominant rhythm was normal sinus rhythm with average heart rate 65bpm and ranged from 42 to 119bpm. Frequent PVCs, ventricular couplets, bigminal PVCs - PVC load 12.6%. Occasional PACs and atrial couplets and triplets Ventricular tachycardia lasting 5 minutes and 11 seconds at 184bpm. Possible atrial fibrillation for 9 minutes and 42 seconds Patient was symptomatic  with afib and VT.      05/29/21: R/LHC Prox RCA lesion is 20% stenosed. Prox Cx to Mid Cx lesion is 20% stenosed. Mid LAD lesion is 30% stenosed.   Mild non-obstructive CAD Normal right and left heart pressures   Recommendations: Medical management of mild CAD. Will resume IV heparin 6 hours post sheath pull given acute PE. Would transition to Eliquis or Xarelto tomorrow     NOTE by Dr. Mayford Knife 05/29/21 reads: "His SOB is far out of proportion to his very small PE.  Minimal CAD on cath and normal filling pressures on RHC with no pulmonary HTN.  RAP , PAP 25/41mmHg with mean PAP , mean PCWP and LVEDP and therefore not volume overloaded.    I personally reviewed the echo images and I feel that his RV is mildly dilated but RVF is normal and LVF is also normal.  I do not think his SOB is cardiac related.  Agree with placing on DOAC for PE.  Recommend Pulmonary consult"   05/26/21: TTE (done during hospitalization for acute PE) IMPRESSIONS   1. Left ventricular ejection fraction, by estimation, is 45 to 50%. The  left ventricle has mildly decreased function. The left ventricle has no  regional wall motion abnormalities. There is severe asymmetric left  ventricular hypertrophy. Left ventricular   diastolic parameters are consistent with Grade I diastolic dysfunction  (impaired relaxation).   2. Right ventricular systolic function is moderately reduced. The right  ventricular size is moderately enlarged. There is moderately elevated  pulmonary artery systolic pressure.   3. Left atrial size was moderately dilated.   4. Right atrial size was mildly dilated.   5. The mitral valve is normal in structure. Mild mitral valve  regurgitation.   6. The aortic valve is normal in structure. Aortic valve regurgitation is  not visualized. No aortic stenosis is present.   7. Aortic dilatation noted. There is mild dilatation of the ascending  aorta, measuring 41 mm.         02/08/21: TTE  1. Left ventricular ejection fraction, by estimation, is 60 to 65%. The  left ventricle has normal function. The left ventricle has no regional  wall motion abnormalities. There is moderate left ventricular hypertrophy.  Left ventricular diastolic  parameters are indeterminate.   2. Right ventricular systolic function is normal. The right ventricular  size is mildly enlarged. Tricuspid regurgitation signal is inadequate for  assessing PA pressure.   3. Left atrial size was moderately  dilated.   4. Right atrial size was moderately dilated.   5. The mitral valve is normal in structure. Trivial mitral valve  regurgitation.   6. The aortic valve is tricuspid. Aortic valve regurgitation is not  visualized. No aortic stenosis is present.   7. Aortic dilatation noted. Aneurysm of the aortic root, measuring 46 mm.  There is dilatation of the ascending aorta, measuring 41 mm.   8. The inferior vena cava is normal in size with greater than 50%  respiratory variability, suggesting right atrial pressure of 3 mmHg.    10/07/2018: EPS/Ablation (Dr. Ladona Ridgel) Conclusion: Successful EP study and catheter ablation of symptomatic PVCs originating from the RV inflow tract.  Successful ablation occurred just below the AV node and just apical of the His bundle region.  During RF energy application at the successful site, there was transient accelerated junctional rhythm.  However no heart block was demonstrated    Patient Profile     65 y.o. 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 Ladona Ridgel) with recurrence since then, RBBB, TAA, CKD (II), HTN, RA(chronic therapy with Humira, methotrexate, prednisone), OSA w/CPAP, June 2022 found with small PE advised to the ER 2/2 abnormal out patient heart monitor with VT and AF  Assessment & Plan    WCT is c/w VT + pt triggered episode, but no symptoms of near syncope or syncope Mostly palpitations with all his pt triggered episodes, some brief CP   2.  AFib New diagnosis for him CHA2DS2Vasc is 53 (age, HTN, PE history and mild NOD) Already on eliquis for his PE   3. PVCs Hx of ablation 2019 12% burden     He has not had syncope or near syncope BB has been changed on admission to propanolol Echo yesterday noted, preserved LVEF, no WMA,  mild LVH, + bubble study with intra-atrial shunt  MRI today to help guide decision for his VT management   For questions or updates, please contact [facility] Please consult [email or web address] for contact info under        Signed, Sheilah Pigeon, PA-C  08/04/2021, 7:41 AM    EP Attending  Patient seen and examined. Agree with above. MRI scan demonstrates an HCM. He has gadolinium uptake. He has sustained monomorphic VT. I have discussed the treatment options with the patient and his family and I have recommended he remain in the hospital and plan for ICD insertion on 8/29.   Ryan Gowda Davida Falconi,MD

## 2021-08-04 NOTE — Telephone Encounter (Signed)
See new tcon patient zio patch with afib and VT and cardiology instructed patient to go to ER.  Pulmonary ordered PFTs and further imaging.

## 2021-08-04 NOTE — Discharge Summary (Addendum)
DISCHARGE SUMMARY    Patient ID: Ryan Glass,  MRN: [id number], DOB/AGE: 13-Apr-1956 65 y.o.  Admit date: 08/02/2021 Discharge date: 08/03/21  Primary Care Physician:Aguiar, Genia Del, MD  Primary Cardiologist: Dr. Mayford Knife Electrophysiologist: Dr. Ladona Ridgel  Primary Discharge Diagnosis:  VT Paroxysmal Afib CHA2DS2Vasc is 5 (On Eliquis appropriately dosed prior to admission w/hx of PE)  Secondary Discharge Diagnosis:  PVCs Prior ablation RBBB HTN RA chronic therapy with Humira, methotrexate, prednisone 5. OSA  w/CPAP 6. Recent small PE Also has HS O2 with his CPAP and wears PRN o2 at home Follows with pulmonary   Allergies  Allergen Reactions   Amlodipine Besylate Swelling    Leg swelling with 10mg  daily am dosing; decreased with BID 5mg  dosing   Aspirin Itching     Procedures This Admission:  ICD implant 08/07/21, Dr. CONCLUSIONS:   1. Hypertrophic cardiomyopathy with sustained monomorphic VT  2. Successful ICD implantation.   3. DFT less than or equal to 20 joules.   4. No early apparent complications.   Brief HPI: Ryan Glass is a 65 y.o. male w/PMHx PVCs (ablated 2019 by Dr. 76) with recurrence since then, RBBB, TAA, CKD (II), HTN, RA(chronic therapy with Humira, methotrexate, prednisone), OSA w/CPAP, PE, (June 2022).  Out patient with recent cardiology follow up not feeling well, continued to c/o SOB/DOE with minimal activity, mentioned seeing pulmonary and started on O2 though not wearing regularly, reported HRs 35-40 at times at hom, eating out with fast foods and noted some LE swelling. He was advised to wear his O2 as directed by pulmonary Recommended 3 days of his PRN lasix and reduce his salt intake Planned a 1 week monitor to evaluate pt's concerns about slow HRs though suspect to be his PVCs. The monitor noted an episode of stained WCT on 07/21/21 4:43PM, HR 150-160bpm (max by monitor 184) and om 07/22/21 new AFib Duration 64min11sec rates  90's-140's. He was called and advised to go to the hospital 2/2 the VT  Hospital Course:  The patient was admitted his labs unremarkable, and feeling at his basleine without specific complaints day of discharge.  His home bystolic and diltiazem were held and started on propanolol, echo was ordered, EP consulted He has not had any syncope, telemetry noted intermittently frequent PVS of 2 morphologies. TTE read LVEF 60-65%, no WMA and only mild LVH, RV function mildly reduced, mildly enlarged.  + bubble study c/w interatrial shunt.  He was started on verapamil for BP and his AF/VT/PVCs  He was monitored on telemetry through his stay which demonstrated SR, 1st degree AVblock, infrequent PVCs.    C.MRI was ordered to evaluate for any infiltrative diseases/abnormalities to held guide management of his VT.   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  CXR 08/08/21 noted no pneumothorax, noted atelectasis vs effusion.  Lings sound clear, saturations are good on RA, his home diuretic resumed.  ICD site was stable, no hematoma or bleeding.  Wound care and activity restrictions were discussed with the patient. He was examined by Dr. 08/09/21 and considered stable for discharge to home.   Will change to coreg 6.25mg  BID for his Betablocker and better BP management and verapamil for CCB   Follow up is in place Resume Eliquis  Saturday 9/3  Current BP 160/94   Physical Exam: Vitals:   08/08/21 0200 08/08/21 0300 08/08/21 0400 08/08/21 0735  BP: (!) 160/116  (!) 160/108   Pulse: 63 60 61   Resp:      Temp:      TempSrc:      SpO2: 93% 96% 95% 94%  Weight:      Height:        GEN- The patient is well appearing, alert and oriented x 3 today.   HEENT: normocephalic, atraumatic;  sclera clear, conjunctiva pink; hearing intact; oropharynx clear Lungs-  CTA b/l, normal work of breathing.  No wheezes, rales, rhonchi Heart-  RRR, no murmurs, rubs or gallops, PMI not laterally displaced GI- soft, non-tender, non-distended Extremities- no clubbing, cyanosis, or edema MS- no significant deformity or atrophy Skin- warm and dry, no rash or lesion hematoma/ecchymosis Psych- euthymic mood, full affect Neuro- no gross defecits  Labs:   Lab Results  Component Value Date   WBC 5.5 08/02/2021   HGB 13.4 08/02/2021   HCT 40.5 08/02/2021   MCV 104.1 (H) 08/02/2021   PLT 168 08/02/2021    Recent Labs  Lab 08/02/21 2116 08/08/21 1123  NA 136 136  K 3.8 4.1  CL 107 104  CO2 21* 23  BUN 22 16  CREATININE 1.10 1.04  CALCIUM 9.1 9.2  PROT 6.1*  --   BILITOT 0.7  --   ALKPHOS 71  --   ALT 26  --   AST 20  --   GLUCOSE 133* 95    Discharge Medications:  Allergies as of 08/08/2021       Reactions   Amlodipine Besylate Swelling   Leg swelling with 10mg  daily am dosing; decreased with BID 5mg  dosing   Aspirin Itching        Medication List     STOP taking these medications    diltiazem 180 MG 24 hr capsule Commonly known as: Cardizem CD   nebivolol 5 MG tablet Commonly known as: BYSTOLIC       TAKE these medications    acetaminophen-codeine 300-30 MG tablet Commonly known as: TYLENOL #3 Take 1-2 tablets by mouth as needed for moderate pain.   albuterol 108 (90 Base) MCG/ACT inhaler Commonly known as: VENTOLIN HFA Inhale 2 puffs into the lungs every 4 (four) hours as needed for wheezing or shortness of breath.   apixaban 5 MG Tabs tablet Commonly known as: ELIQUIS Take 1 tablet (5 mg total) by mouth 2 (two) times daily. Notes to patient: Resume om Saturday 08/12/21   atorvastatin 40 MG tablet Commonly known as: LIPITOR Take 1 tablet (40 mg total) by mouth daily.   carvedilol 6.25 MG tablet Commonly known as: COREG Take 1 tablet (6.25 mg  total) by mouth 2 (two) times daily with a meal.   chlorthalidone 25 MG tablet Commonly known as: HYGROTON Take 25 mg by mouth daily.   colchicine 0.6 MG tablet Take 1-2 tablets by mouth daily as needed (gout).   fluticasone 50 MCG/ACT nasal spray Commonly known as: FLONASE Place 1 spray into both nostrils daily.   Fluticasone-Salmeterol 250-50 MCG/DOSE Aepb Commonly known as: ADVAIR Inhale 1 puff into the lungs every 12 (twelve) hours.   folic acid 1 MG tablet Commonly known as: FOLVITE Take 1 mg by mouth daily.   furosemide 40 MG tablet Commonly known as: LASIX Take 1 tablet (40 mg total) by mouth as needed. Take 1 tablet by mouth daily for 3  days, then decrease to only as needed for increase weight gain   gabapentin 600 MG tablet Commonly known as: NEURONTIN Take 600 mg by mouth 3 (three) times daily.   Humira Pen 40 MG/0.4ML Pnkt Generic drug: Adalimumab Inject 40 mg as directed every 14 (fourteen) days.   hydrALAZINE 50 MG tablet Commonly known as: APRESOLINE Take 1 tablet (50 mg total) by mouth 2 (two) times daily. What changed:  medication strength how much to take when to take this   losartan 100 MG tablet Commonly known as: COZAAR Take 1 tablet (100 mg total) by mouth daily.   methotrexate 2.5 MG tablet Take 15 mg by mouth every Sunday.   montelukast 10 MG tablet Commonly known as: SINGULAIR Take 1 tablet (10 mg total) by mouth at bedtime.   Potassium Chloride ER 20 MEQ Tbcr Take 20 mEq by mouth in the morning and at bedtime.   predniSONE 5 MG tablet Commonly known as: DELTASONE Take 5 mg by mouth daily.   sildenafil 20 MG tablet Commonly known as: REVATIO Take 20 mg by mouth daily as needed (erectile dysfunction).   spironolactone 25 MG tablet Commonly known as: ALDACTONE Take 0.5 tablets (12.5 mg total) by mouth daily.   verapamil 180 MG CR tablet Commonly known as: CALAN-SR Take 1 tablet (180 mg total) by mouth daily. Start taking on:  August 09, 2021               Discharge Care Instructions  (From admission, onward)           Start     Ordered   08/08/21 0000  Discharge wound care:       Comments: As per AVS   08/08/21 1254            Disposition:  Home    Discharge Instructions     Diet - low sodium heart healthy   Complete by: As directed    Discharge wound care:   Complete by: As directed    As per AVS   Increase activity slowly   Complete by: As directed        Follow-up Information     Marinus Maw, MD Follow up.   Specialty: Cardiology Why: 11/13/21 @ 3:45PM Contact information: [address] [place] [address] [phone]         Graciella Freer, PA-C Follow up.   Specialty: Physician Assistant Why: 08/18/21 @ 10:20AM, wound check visit Contact information: [address] [place] [address] [phone]         Sheilah Pigeon, PA-C Follow up.   Specialty: Cardiology Why: 09/11/21 @ 8:45AM Contact information: [address] [place] [address] [phone]                 Duration of Discharge Encounter: Greater than 30 minutes including physician time.  Norma Fredrickson, PA-C 08/08/2021 12:59 PM  EP Attending  Patient seen and examined. Agree with above. He is stable for DC. His ICD has been interrogated under my direction and demonstrates normal DDD ICD function. His CXR demonstrates no PTX. He will be discharged home with usual followup. He will restart his Eliquis on 9/3.   Sharlot Gowda Pragya Lofaso,MD

## 2021-08-04 NOTE — Progress Notes (Signed)
Per previous notes, pt doing well on RA at rest. RT in to see pt to set up CPAP, pt had [place] in nose but O2 was off. Pt slightly winded while speaking with RT, so turned O2 to 2L. Pt knows to take [place] off if he feels he doesn't need O2 (per home use), and can place back on if needed. Auto CPAP (6-18) set up for pt with FFM and O2 ready to be bled in. Pt to notify for RT when ready for bed and I will assist with CPAP. RT will continue to monitor.

## 2021-08-05 DIAGNOSIS — I472 Ventricular tachycardia: Secondary | ICD-10-CM | POA: Diagnosis not present

## 2021-08-05 MED ORDER — APIXABAN 5 MG PO TABS
5.0000 mg | ORAL_TABLET | Freq: Two times a day (BID) | ORAL | Status: AC
  Administered 2021-08-05 – 2021-08-06 (×2): 5 mg via ORAL
  Filled 2021-08-05 (×2): qty 1

## 2021-08-05 MED ORDER — COLCHICINE 0.6 MG PO TABS
0.6000 mg | ORAL_TABLET | Freq: Every day | ORAL | Status: DC | PRN
  Administered 2021-08-05 – 2021-08-08 (×3): 0.6 mg via ORAL
  Filled 2021-08-05 (×4): qty 1

## 2021-08-05 NOTE — Plan of Care (Signed)

## 2021-08-05 NOTE — Progress Notes (Signed)
Progress Note  Patient Name: Ryan Glass Date of Encounter: 08/05/2021  [facility] Cardiologist: [name], MD  EP: Dr. Ladona Ridgel  Subjective   Currently feeling well without complaint  Inpatient Medications    Scheduled Meds:  apixaban  5 mg Oral BID   atorvastatin  40 mg Oral Daily   gabapentin  600 mg Oral TID   losartan  100 mg Oral Daily   [START ON 08/06/2021] methotrexate  15 mg Oral Q Sun   mometasone-formoterol  2 puff Inhalation BID   montelukast  10 mg Oral QHS   potassium chloride SA  20 mEq Oral Daily   predniSONE  5 mg Oral Q breakfast   propranolol  10 mg Oral TID   spironolactone  12.5 mg Oral Daily   verapamil  180 mg Oral Daily   Continuous Infusions:  PRN Meds: acetaminophen, hydrALAZINE   Vital Signs    Vitals:   08/05/21 0331 08/05/21 0834 08/05/21 0837 08/05/21 0852  BP: (!) 144/103 (!) 163/111 (!) 159/97   Pulse: (!) 54   76  Resp: 15 18  18   Temp: 97.6 F (36.4 C) 98.1 F (36.7 C)    TempSrc: Axillary Oral    SpO2: 98%   94%  Weight:      Height:       No intake or output data in the 24 hours ending 08/05/21 1028  Last 3 Weights 08/02/2021 08/02/2021 07/20/2021  Weight (lbs) 253 lb 1.4 oz 253 lb 8.5 oz 254 lb  Weight (kg) 114.8 kg 115 kg 115.214 kg      Telemetry    Sinus rhythm, PVCs-personally reviewed  ECG    No new  Physical Exam   GEN: Well nourished, well developed, in no acute distress  HEENT: normal  Neck: no JVD, carotid bruits, or masses Cardiac: RRR; no murmurs, rubs, or gallops,no edema  Respiratory:  clear to auscultation bilaterally, normal work of breathing GI: soft, nontender, nondistended, + BS MS: no deformity or atrophy  Skin: warm and dry Neuro:  Strength and sensation are intact Psych: euthymic mood, full affect   Labs    High Sensitivity Troponin:   Recent Labs  Lab 08/02/21 2116 08/03/21 0016  TROPONINIHS 11 13       Chemistry Recent Labs  Lab 08/02/21 2116  NA 136  K 3.8   CL 107  CO2 21*  GLUCOSE 133*  BUN 22  CREATININE 1.10  CALCIUM 9.1  PROT 6.1*  ALBUMIN 3.7  AST 20  ALT 26  ALKPHOS 71  BILITOT 0.7  GFRNONAA >60  ANIONGAP 8      Hematology Recent Labs  Lab 08/02/21 2116  WBC 5.5  RBC 3.89*  HGB 13.4  HCT 40.5  MCV 104.1*  MCH 34.4*  MCHC 33.1  RDW 15.3  PLT 168     BNP Recent Labs  Lab 08/02/21 2201  BNP 50.5      DDimer No results for input(s): DDIMER in the last 168 hours.   Radiology      Cardiac Studies   08/03/21: TTE IMPRESSIONS   1. Left ventricular ejection fraction, by estimation, is 60 to 65%. The  left ventricle has normal function. The left ventricle has no regional  wall motion abnormalities. The left ventricular internal cavity size was  mildly dilated. There is mild left  ventricular hypertrophy. Left ventricular diastolic parameters are  consistent with Grade I diastolic dysfunction (impaired relaxation).   2. Right ventricular systolic function  is mildly reduced. The right  ventricular size is mildly enlarged.   3. Left atrial size was mildly dilated.   4. The mitral valve is normal in structure. No evidence of mitral valve  regurgitation. No evidence of mitral stenosis.   5. The aortic valve is tricuspid. Aortic valve regurgitation is not  visualized. Mild aortic valve sclerosis is present, with no evidence of  aortic valve stenosis.   6. Aortic dilatation noted. There is moderate dilatation of the aortic  root, measuring 46 mm. There is mild dilatation of the ascending aorta,  measuring 40 mm.   7. Agitated saline contrast bubble study was positive with shunting  observed within 3-6 cardiac cycles suggestive of interatrial shunt.    Monitor 8/7-15/2022 Predominant rhythm was normal sinus rhythm with average heart rate 65bpm and ranged from 42 to 119bpm. Frequent PVCs, ventricular couplets, bigminal PVCs - PVC load 12.6%. Occasional PACs and atrial couplets and triplets Ventricular  tachycardia lasting 5 minutes and 11 seconds at 184bpm. Possible atrial fibrillation for 9 minutes and 42 seconds Patient was symptomatic with afib and VT.      05/29/21: R/LHC Prox RCA lesion is 20% stenosed. Prox Cx to Mid Cx lesion is 20% stenosed. Mid LAD lesion is 30% stenosed.   Mild non-obstructive CAD Normal right and left heart pressures   Recommendations: Medical management of mild CAD. Ryan Glass resume IV heparin 6 hours post sheath pull given acute PE. Would transition to Eliquis or Xarelto tomorrow     NOTE by Dr. Mayford Knife 05/29/21 reads: "His SOB is far out of proportion to his very small PE.  Minimal CAD on cath and normal filling pressures on RHC with no pulmonary HTN.  RAP , PAP 25/65mmHg with mean PAP , mean PCWP and LVEDP and therefore not volume overloaded.    I personally reviewed the echo images and I feel that his RV is mildly dilated but RVF is normal and LVF is also normal.  I do not think his SOB is cardiac related.  Agree with placing on DOAC for PE.  Recommend Pulmonary consult"   05/26/21: TTE (done during hospitalization for acute PE) IMPRESSIONS   1. Left ventricular ejection fraction, by estimation, is 45 to 50%. The  left ventricle has mildly decreased function. The left ventricle has no  regional wall motion abnormalities. There is severe asymmetric left  ventricular hypertrophy. Left ventricular   diastolic parameters are consistent with Grade I diastolic dysfunction  (impaired relaxation).   2. Right ventricular systolic function is moderately reduced. The right  ventricular size is moderately enlarged. There is moderately elevated  pulmonary artery systolic pressure.   3. Left atrial size was moderately dilated.   4. Right atrial size was mildly dilated.   5. The mitral valve is normal in structure. Mild mitral valve  regurgitation.   6. The aortic valve is normal in structure. Aortic valve regurgitation is  not visualized. No  aortic stenosis is present.   7. Aortic dilatation noted. There is mild dilatation of the ascending  aorta, measuring 41 mm.        02/08/21: TTE  1. Left ventricular ejection fraction, by estimation, is 60 to 65%. The  left ventricle has normal function. The left ventricle has no regional  wall motion abnormalities. There is moderate left ventricular hypertrophy.  Left ventricular diastolic  parameters are indeterminate.   2. Right ventricular systolic function is normal. The right ventricular  size is mildly enlarged. Tricuspid regurgitation signal  is inadequate for  assessing PA pressure.   3. Left atrial size was moderately dilated.   4. Right atrial size was moderately dilated.   5. The mitral valve is normal in structure. Trivial mitral valve  regurgitation.   6. The aortic valve is tricuspid. Aortic valve regurgitation is not  visualized. No aortic stenosis is present.   7. Aortic dilatation noted. Aneurysm of the aortic root, measuring 46 mm.  There is dilatation of the ascending aorta, measuring 41 mm.   8. The inferior vena cava is normal in size with greater than 50%  respiratory variability, suggesting right atrial pressure of 3 mmHg.    10/07/2018: EPS/Ablation (Dr. Ladona Ridgel) Conclusion: Successful EP study and catheter ablation of symptomatic PVCs originating from the RV inflow tract.  Successful ablation occurred just below the AV node and just apical of the His bundle region.  During RF energy application at the successful site, there was transient accelerated junctional rhythm.  However no heart block was demonstrated    Patient Profile     65 y.o. 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  Wide-complex  tachycardia consistent with VT: No symptoms of syncope or near syncope.  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  Also glycoside is pending.  We Ryan Glass plan for ICD implant on Monday.  2.  Atrial fibrillation: New diagnosis for him.  CHA2DS2-VASc of 5.  Currently on Eliquis.  3.  PVCs: 12% burden.  Status post ablation in 2019.  Continue to monitor.    For questions or updates, please contact [facility] Please consult [email or web address] for contact info under        Signed, Ryan Schrodt Jorja Loa, MD  08/05/2021, 10:28 AM

## 2021-08-05 NOTE — Plan of Care (Signed)

## 2021-08-05 NOTE — Telephone Encounter (Signed)
See tcon patient instructed zio patch showed v tach and afib per Dr Mayford Knife and recommended to go to ER.  Was admitted.  Cardiology update today still inpatient "1.  Wide-complex tachycardia consistent with VT: No symptoms of syncope or near syncope.  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  Also glycoside is pending.  We will plan for ICD implant on Monday.   2.  Atrial fibrillation: New diagnosis for him.  CHA2DS2-VASc of 5.  Currently on Eliquis.   3.  PVCs: 12% burden.  Status post ablation in 2019.  Continue to monitor."

## 2021-08-05 NOTE — Progress Notes (Signed)
Patient's right foot posteriorly is red and visibly swollen in comparison to left. Patient admits to this being painful and states, "I think this is gout starting again." Patient on colchicine at home on a regular basis. Physician paged to request colchicine here. Continue to monitor.

## 2021-08-06 DIAGNOSIS — I472 Ventricular tachycardia: Secondary | ICD-10-CM | POA: Diagnosis not present

## 2021-08-06 LAB — MRSA NEXT GEN BY PCR, NASAL: MRSA by PCR Next Gen: NOT DETECTED

## 2021-08-06 LAB — SURGICAL PCR SCREEN
MRSA, PCR: NEGATIVE
Staphylococcus aureus: POSITIVE — AB

## 2021-08-06 MED ORDER — CHLORHEXIDINE GLUCONATE CLOTH 2 % EX PADS
6.0000 | MEDICATED_PAD | Freq: Every day | CUTANEOUS | Status: DC
  Administered 2021-08-07: 6 via TOPICAL

## 2021-08-06 MED ORDER — SODIUM CHLORIDE 0.9 % IV SOLN: INTRAVENOUS | Status: DC

## 2021-08-06 MED ORDER — MUPIROCIN 2 % EX OINT
1.0000 "application " | TOPICAL_OINTMENT | Freq: Two times a day (BID) | CUTANEOUS | Status: DC
  Administered 2021-08-07 – 2021-08-08 (×4): 1 via NASAL
  Filled 2021-08-06: qty 22

## 2021-08-06 MED ORDER — ACETAMINOPHEN 325 MG PO TABS
650.0000 mg | ORAL_TABLET | Freq: Four times a day (QID) | ORAL | Status: DC | PRN
  Administered 2021-08-07: 650 mg via ORAL
  Filled 2021-08-06: qty 2

## 2021-08-06 MED ORDER — CEFAZOLIN SODIUM-DEXTROSE 2-4 GM/100ML-% IV SOLN
2.0000 g | INTRAVENOUS | Status: AC
  Administered 2021-08-07: 2 g via INTRAVENOUS
  Filled 2021-08-06: qty 100

## 2021-08-06 MED ORDER — SODIUM CHLORIDE 0.9 % IV SOLN
80.0000 mg | INTRAVENOUS | Status: AC
  Administered 2021-08-07: 80 mg

## 2021-08-06 MED ORDER — OXYCODONE-ACETAMINOPHEN 5-325 MG PO TABS
1.0000 | ORAL_TABLET | Freq: Four times a day (QID) | ORAL | Status: DC | PRN
  Administered 2021-08-06 – 2021-08-08 (×7): 1 via ORAL
  Filled 2021-08-06 (×7): qty 1

## 2021-08-06 NOTE — Progress Notes (Signed)
Progress Note  Patient Name: Ryan Glass Date of Encounter: 08/06/2021  [facility] Cardiologist: [name], MD  EP: Dr. Ladona Ridgel  Subjective   Currently feeling well without complaint.  Inpatient Medications    Scheduled Meds:  apixaban  5 mg Oral BID   atorvastatin  40 mg Oral Daily   gabapentin  600 mg Oral TID   losartan  100 mg Oral Daily   methotrexate  15 mg Oral Q Sun   mometasone-formoterol  2 puff Inhalation BID   montelukast  10 mg Oral QHS   potassium chloride SA  20 mEq Oral Daily   predniSONE  5 mg Oral Q breakfast   propranolol  10 mg Oral TID   spironolactone  12.5 mg Oral Daily   verapamil  180 mg Oral Daily   Continuous Infusions:  PRN Meds: acetaminophen, colchicine, hydrALAZINE   Vital Signs    Vitals:   08/06/21 0300 08/06/21 0348 08/06/21 0728 08/06/21 0740  BP: (!) 166/122 (!) 179/86  (!) 166/104  Pulse: 61   62  Resp: 16   17  Temp: (!) 97.3 F (36.3 C)   (!) 97.5 F (36.4 C)  TempSrc: Oral   Oral  SpO2: 92%  95% 95%  Weight:      Height:        Intake/Output Summary (Last 24 hours) at 08/06/2021 0920 Last data filed at 08/06/2021 0743 Gross per 24 hour  Intake 1180 ml  Output --  Net 1180 ml    Last 3 Weights 08/02/2021 08/02/2021 07/20/2021  Weight (lbs) 253 lb 1.4 oz 253 lb 8.5 oz 254 lb  Weight (kg) 114.8 kg 115 kg 115.214 kg      Telemetry    Sinus rhythm with PVCs-personally reviewed  ECG    No new  Physical Exam   GEN: Well nourished, well developed, in no acute distress  HEENT: normal  Neck: no JVD, carotid bruits, or masses Cardiac: RRR; no murmurs, rubs, or gallops,no edema  Respiratory:  clear to auscultation bilaterally, normal work of breathing GI: soft, nontender, nondistended, + BS MS: no deformity or atrophy  Skin: warm and dry Neuro:  Strength and sensation are intact Psych: euthymic mood, full affect    Labs    High Sensitivity Troponin:   Recent Labs  Lab 08/02/21 2116  08/03/21 0016  TROPONINIHS 11 13       Chemistry Recent Labs  Lab 08/02/21 2116  NA 136  K 3.8  CL 107  CO2 21*  GLUCOSE 133*  BUN 22  CREATININE 1.10  CALCIUM 9.1  PROT 6.1*  ALBUMIN 3.7  AST 20  ALT 26  ALKPHOS 71  BILITOT 0.7  GFRNONAA >60  ANIONGAP 8      Hematology Recent Labs  Lab 08/02/21 2116  WBC 5.5  RBC 3.89*  HGB 13.4  HCT 40.5  MCV 104.1*  MCH 34.4*  MCHC 33.1  RDW 15.3  PLT 168     BNP Recent Labs  Lab 08/02/21 2201  BNP 50.5      DDimer No results for input(s): DDIMER in the last 168 hours.   Radiology      Cardiac Studies   08/03/21: TTE IMPRESSIONS   1. Left ventricular ejection fraction, by estimation, is 60 to 65%. The  left ventricle has normal function. The left ventricle has no regional  wall motion abnormalities. The left ventricular internal cavity size was  mildly dilated. There is mild left  ventricular hypertrophy.  Left ventricular diastolic parameters are  consistent with Grade I diastolic dysfunction (impaired relaxation).   2. Right ventricular systolic function is mildly reduced. The right  ventricular size is mildly enlarged.   3. Left atrial size was mildly dilated.   4. The mitral valve is normal in structure. No evidence of mitral valve  regurgitation. No evidence of mitral stenosis.   5. The aortic valve is tricuspid. Aortic valve regurgitation is not  visualized. Mild aortic valve sclerosis is present, with no evidence of  aortic valve stenosis.   6. Aortic dilatation noted. There is moderate dilatation of the aortic  root, measuring 46 mm. There is mild dilatation of the ascending aorta,  measuring 40 mm.   7. Agitated saline contrast bubble study was positive with shunting  observed within 3-6 cardiac cycles suggestive of interatrial shunt.    Monitor 8/7-15/2022 Predominant rhythm was normal sinus rhythm with average heart rate 65bpm and ranged from 42 to 119bpm. Frequent PVCs, ventricular  couplets, bigminal PVCs - PVC load 12.6%. Occasional PACs and atrial couplets and triplets Ventricular tachycardia lasting 5 minutes and 11 seconds at 184bpm. Possible atrial fibrillation for 9 minutes and 42 seconds Patient was symptomatic with afib and VT.      05/29/21: R/LHC Prox RCA lesion is 20% stenosed. Prox Cx to Mid Cx lesion is 20% stenosed. Mid LAD lesion is 30% stenosed.   Mild non-obstructive CAD Normal right and left heart pressures   Recommendations: Medical management of mild CAD. Jasimine Simms resume IV heparin 6 hours post sheath pull given acute PE. Would transition to Eliquis or Xarelto tomorrow     NOTE by Dr. Mayford Knife 05/29/21 reads: "His SOB is far out of proportion to his very small PE.  Minimal CAD on cath and normal filling pressures on RHC with no pulmonary HTN.  RAP , PAP 25/65mmHg with mean PAP , mean PCWP and LVEDP and therefore not volume overloaded.    I personally reviewed the echo images and I feel that his RV is mildly dilated but RVF is normal and LVF is also normal.  I do not think his SOB is cardiac related.  Agree with placing on DOAC for PE.  Recommend Pulmonary consult"   05/26/21: TTE (done during hospitalization for acute PE) IMPRESSIONS   1. Left ventricular ejection fraction, by estimation, is 45 to 50%. The  left ventricle has mildly decreased function. The left ventricle has no  regional wall motion abnormalities. There is severe asymmetric left  ventricular hypertrophy. Left ventricular   diastolic parameters are consistent with Grade I diastolic dysfunction  (impaired relaxation).   2. Right ventricular systolic function is moderately reduced. The right  ventricular size is moderately enlarged. There is moderately elevated  pulmonary artery systolic pressure.   3. Left atrial size was moderately dilated.   4. Right atrial size was mildly dilated.   5. The mitral valve is normal in structure. Mild mitral valve   regurgitation.   6. The aortic valve is normal in structure. Aortic valve regurgitation is  not visualized. No aortic stenosis is present.   7. Aortic dilatation noted. There is mild dilatation of the ascending  aorta, measuring 41 mm.        02/08/21: TTE  1. Left ventricular ejection fraction, by estimation, is 60 to 65%. The  left ventricle has normal function. The left ventricle has no regional  wall motion abnormalities. There is moderate left ventricular hypertrophy.  Left ventricular diastolic  parameters are  indeterminate.   2. Right ventricular systolic function is normal. The right ventricular  size is mildly enlarged. Tricuspid regurgitation signal is inadequate for  assessing PA pressure.   3. Left atrial size was moderately dilated.   4. Right atrial size was moderately dilated.   5. The mitral valve is normal in structure. Trivial mitral valve  regurgitation.   6. The aortic valve is tricuspid. Aortic valve regurgitation is not  visualized. No aortic stenosis is present.   7. Aortic dilatation noted. Aneurysm of the aortic root, measuring 46 mm.  There is dilatation of the ascending aorta, measuring 41 mm.   8. The inferior vena cava is normal in size with greater than 50%  respiratory variability, suggesting right atrial pressure of 3 mmHg.    10/07/2018: EPS/Ablation (Dr. Ladona Ridgel) Conclusion: Successful EP study and catheter ablation of symptomatic PVCs originating from the RV inflow tract.  Successful ablation occurred just below the AV node and just apical of the His bundle region.  During RF energy application at the successful site, there was transient accelerated junctional rhythm.  However no heart block was demonstrated    Patient Profile     65 y.o. 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 Ladona Ridgel) with recurrence since then, RBBB, TAA, CKD (II), HTN, RA(chronic therapy with Humira, methotrexate, prednisone), OSA w/CPAP, June 2022 found with small PE advised  to the ER 2/2 abnormal out patient heart monitor with VT and AF  Assessment & Plan    1.  Wide-complex tachycardia consistent with VT: No symptoms of syncope and or near syncope.  Cardiac MRI with evidence of symmetric hypertrophy consistent with hypertrophic cardiomyopathy.  Also possibility of Fabry disease.  Apical Actisite ACE pending.  We Hayli Milligan plan for ICD tomorrow.  2.  Atrial fibrillation: This is a new diagnosis for him.  Currently on Eliquis.  CHA2DS2-VASc of 5.  3.  PVCs: 12% burden.  Status post ablation in 2019.  Continue to monitor.   For questions or updates, please contact [facility] Please consult [email or web address] for contact info under        Signed, Allycia Pitz Jorja Loa, MD  08/06/2021, 9:20 AM

## 2021-08-06 NOTE — Telephone Encounter (Signed)
Patient contacted via telephone and he reported after MRI he was having back pain.  He received pain medication today and is feeling better.  Patient reported ICD procedure planned Monday and discharge should be 24 hours later.  Patient reported he is ready to go home.  Dr Ladona Ridgel scheduled to do his ICD procedure.  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  Discussed with patient to ensure any work restrictions are listed on his hospital discharge paperwork.  Discussed with patient I have only told HR that he is in the hospital and no other medical information is shared.  Patient reported his daughters came to visit but spouse has stayed at home.  Patient had no further questions or concerns at this time.  A&Ox3 spoke full sentences without difficulty no cough/congestion/wheezing/throat clearing during 13 minute telephone call.

## 2021-08-07 ENCOUNTER — Encounter (HOSPITAL_COMMUNITY)
Admission: EM | Disposition: A | Discharge status: Home / Self Care | Payer: Self-pay | Source: Home / Self Care | Attending: Cardiology

## 2021-08-07 DIAGNOSIS — I421 Obstructive hypertrophic cardiomyopathy: Secondary | ICD-10-CM | POA: Diagnosis not present

## 2021-08-07 DIAGNOSIS — I472 Ventricular tachycardia: Secondary | ICD-10-CM | POA: Diagnosis not present

## 2021-08-07 HISTORY — PX: ICD IMPLANT: EP1208

## 2021-08-07 SURGERY — ICD IMPLANT

## 2021-08-07 MED ORDER — LORAZEPAM 2 MG/ML IJ SOLN
1.0000 mg | Freq: Once | INTRAMUSCULAR | Status: AC
  Administered 2021-08-07: 1 mg via INTRAVENOUS
  Filled 2021-08-07: qty 1

## 2021-08-07 MED ORDER — LIDOCAINE HCL (PF) 1 % IJ SOLN
INTRAMUSCULAR | Status: DC | PRN
  Administered 2021-08-07: 60 mL

## 2021-08-07 MED ORDER — SODIUM CHLORIDE 0.9 % IV SOLN
INTRAVENOUS | Status: AC
  Filled 2021-08-07: qty 2

## 2021-08-07 MED ORDER — ONDANSETRON HCL 4 MG/2ML IJ SOLN: 4.0000 mg | Freq: Four times a day (QID) | INTRAMUSCULAR | Status: DC | PRN

## 2021-08-07 MED ORDER — ACETAMINOPHEN 325 MG PO TABS: 325.0000 mg | ORAL_TABLET | ORAL | Status: DC | PRN

## 2021-08-07 MED ORDER — CEFAZOLIN SODIUM-DEXTROSE 2-4 GM/100ML-% IV SOLN
INTRAVENOUS | Status: AC
  Filled 2021-08-07: qty 100

## 2021-08-07 MED ORDER — FENTANYL CITRATE (PF) 100 MCG/2ML IJ SOLN
INTRAMUSCULAR | Status: AC
  Filled 2021-08-07: qty 2

## 2021-08-07 MED ORDER — HEPARIN (PORCINE) IN NACL 1000-0.9 UT/500ML-% IV SOLN
INTRAVENOUS | Status: DC | PRN
  Administered 2021-08-07: 500 mL

## 2021-08-07 MED ORDER — HEPARIN (PORCINE) IN NACL 1000-0.9 UT/500ML-% IV SOLN
INTRAVENOUS | Status: AC
  Filled 2021-08-07: qty 500

## 2021-08-07 MED ORDER — MIDAZOLAM HCL 5 MG/5ML IJ SOLN
INTRAMUSCULAR | Status: DC | PRN
  Administered 2021-08-07 (×2): 2 mg via INTRAVENOUS
  Administered 2021-08-07: 1 mg via INTRAVENOUS
  Administered 2021-08-07: 2 mg via INTRAVENOUS
  Administered 2021-08-07: 1 mg via INTRAVENOUS
  Administered 2021-08-07: 2 mg via INTRAVENOUS

## 2021-08-07 MED ORDER — LIDOCAINE HCL (PF) 1 % IJ SOLN
INTRAMUSCULAR | Status: AC
  Filled 2021-08-07: qty 60

## 2021-08-07 MED ORDER — FENTANYL CITRATE (PF) 100 MCG/2ML IJ SOLN
INTRAMUSCULAR | Status: DC | PRN
  Administered 2021-08-07: 12.5 ug via INTRAVENOUS
  Administered 2021-08-07 (×2): 25 ug via INTRAVENOUS
  Administered 2021-08-07: 12.5 ug via INTRAVENOUS
  Administered 2021-08-07: 25 ug via INTRAVENOUS

## 2021-08-07 MED ORDER — MIDAZOLAM HCL 5 MG/5ML IJ SOLN
INTRAMUSCULAR | Status: AC
  Filled 2021-08-07: qty 5

## 2021-08-07 MED ORDER — CEFAZOLIN SODIUM-DEXTROSE 1-4 GM/50ML-% IV SOLN
1.0000 g | Freq: Four times a day (QID) | INTRAVENOUS | Status: AC
  Administered 2021-08-07 – 2021-08-08 (×3): 1 g via INTRAVENOUS
  Filled 2021-08-07 (×4): qty 50

## 2021-08-07 SURGICAL SUPPLY — 9 items
CABLE SURGICAL S-101-97-12 (CABLE) ×2 IMPLANT
ICD GALLANT DR CDDRA500Q (ICD Generator) ×1 IMPLANT
LEAD DURATA 7122Q-65CM (Lead) ×1 IMPLANT
LEAD TENDRIL MRI 52CM LPA1200M (Lead) ×1 IMPLANT
MAT PREVALON FULL STRYKER (MISCELLANEOUS) ×1 IMPLANT
PAD PRO RADIOLUCENT 2001M-C (PAD) ×2 IMPLANT
SHEATH 7FR PRELUDE SNAP 13 (SHEATH) ×1 IMPLANT
SHEATH 8FR PRELUDE SNAP 13 (SHEATH) ×1 IMPLANT
TRAY PACEMAKER INSERTION (PACKS) ×2 IMPLANT

## 2021-08-07 NOTE — Telephone Encounter (Signed)
Per Epic patient ICD device procedure scheduled for 1600 today.

## 2021-08-07 NOTE — Progress Notes (Addendum)
Progress Note  Patient Name: Ryan Glass Date of Encounter: 08/07/2021  [facility] Cardiologist: [name], MD  EP: Dr. Ladona Ridgel  Subjective   Low back R back, started to give him pain after laying on the MRI bad, otherwise just antsy to get home  Inpatient Medications    Scheduled Meds:  atorvastatin  40 mg Oral Daily   Chlorhexidine Gluconate Cloth  6 each Topical Daily   gabapentin  600 mg Oral TID   gentamicin irrigation  80 mg Irrigation On Call   losartan  100 mg Oral Daily   methotrexate  15 mg Oral Q Sun   mometasone-formoterol  2 puff Inhalation BID   montelukast  10 mg Oral QHS   mupirocin ointment  1 application Nasal BID   potassium chloride SA  20 mEq Oral Daily   predniSONE  5 mg Oral Q breakfast   propranolol  10 mg Oral TID   spironolactone  12.5 mg Oral Daily   verapamil  180 mg Oral Daily   Continuous Infusions:  sodium chloride 50 mL/hr at 08/07/21 0641    ceFAZolin (ANCEF) IV     PRN Meds: acetaminophen, colchicine, hydrALAZINE, oxyCODONE-acetaminophen   Vital Signs    Vitals:   08/07/21 0057 08/07/21 0217 08/07/21 0302 08/07/21 0740  BP:  (!) 193/117 140/85 (!) 167/99  Pulse: (!) 44  66 70  Resp: 16  19   Temp:   97.9 F (36.6 C) (!) 97.4 F (36.3 C)  TempSrc:   Oral Oral  SpO2: 92%  95% 91%  Weight:      Height:        Intake/Output Summary (Last 24 hours) at 08/07/2021 0805 Last data filed at 08/07/2021 0430 Gross per 24 hour  Intake 960 ml  Output --  Net 960 ml   Last 3 Weights 08/02/2021 08/02/2021 07/20/2021  Weight (lbs) 253 lb 1.4 oz 253 lb 8.5 oz 254 lb  Weight (kg) 114.8 kg 115 kg 115.214 kg      Telemetry    SR, infrequent PVCs - Personally Reviewed  ECG    No new EKGs - Personally Reviewed  Physical Exam   GEN: No acute distress.   Neck: No JVD Cardiac: RRR, no murmurs, rubs, or gallops.  Respiratory: CTA b/l. GI: Soft, nontender, non-distended  MS: No edema; No deformity. Neuro:  Nonfocal  Psych:  Normal affect   Labs    High Sensitivity Troponin:   Recent Labs  Lab 08/02/21 2116 08/03/21 0016  TROPONINIHS 11 13      Chemistry Recent Labs  Lab 08/02/21 2116  NA 136  K 3.8  CL 107  CO2 21*  GLUCOSE 133*  BUN 22  CREATININE 1.10  CALCIUM 9.1  PROT 6.1*  ALBUMIN 3.7  AST 20  ALT 26  ALKPHOS 71  BILITOT 0.7  GFRNONAA >60  ANIONGAP 8     Hematology Recent Labs  Lab 08/02/21 2116  WBC 5.5  RBC 3.89*  HGB 13.4  HCT 40.5  MCV 104.1*  MCH 34.4*  MCHC 33.1  RDW 15.3  PLT 168    BNP Recent Labs  Lab 08/02/21 2201  BNP 50.5     DDimer No results for input(s): DDIMER in the last 168 hours.   Radiology      Cardiac Studies    08/04/21; c.MRI IMPRESSION: 1. Asymmetric LV hypertrophy measuring 44mm in basal septum (31mm in basal lateral wall). This meets criteria for hypertrophic cardiomyopathy   2.  Patchy late gadolinium enhancement at RV insertion site and basal lateral wall. LGE accounts for 1% of total myocardial mass. While this LGE pattern is consistent with HCM, would also consider Fabry disease as it can present with basal lateral LGE and LVH. Would recommend checking alpha-galactosidase A level   3. Normal LV size and systolic function (EF 52%), though was difficult to quantify volumes/EF due to significant motion artifact during acquisition   4.  Normal RV size with mild systolic dysfunction (EF 45%)   5.  Small pericardial effusion   6.  Dilated ascending aorta measuring 84mm   08/03/21: TTE IMPRESSIONS   1. Left ventricular ejection fraction, by estimation, is 60 to 65%. The  left ventricle has normal function. The left ventricle has no regional  wall motion abnormalities. The left ventricular internal cavity size was  mildly dilated. There is mild left  ventricular hypertrophy. Left ventricular diastolic parameters are  consistent with Grade I diastolic dysfunction (impaired relaxation).   2. Right ventricular systolic  function is mildly reduced. The right  ventricular size is mildly enlarged.   3. Left atrial size was mildly dilated.   4. The mitral valve is normal in structure. No evidence of mitral valve  regurgitation. No evidence of mitral stenosis.   5. The aortic valve is tricuspid. Aortic valve regurgitation is not  visualized. Mild aortic valve sclerosis is present, with no evidence of  aortic valve stenosis.   6. Aortic dilatation noted. There is moderate dilatation of the aortic  root, measuring 46 mm. There is mild dilatation of the ascending aorta,  measuring 40 mm.   7. Agitated saline contrast bubble study was positive with shunting  observed within 3-6 cardiac cycles suggestive of interatrial shunt.    Monitor 8/7-15/2022 Predominant rhythm was normal sinus rhythm with average heart rate 65bpm and ranged from 42 to 119bpm. Frequent PVCs, ventricular couplets, bigminal PVCs - PVC load 12.6%. Occasional PACs and atrial couplets and triplets Ventricular tachycardia lasting 5 minutes and 11 seconds at 184bpm. Possible atrial fibrillation for 9 minutes and 42 seconds Patient was symptomatic with afib and VT.      05/29/21: R/LHC Prox RCA lesion is 20% stenosed. Prox Cx to Mid Cx lesion is 20% stenosed. Mid LAD lesion is 30% stenosed.   Mild non-obstructive CAD Normal right and left heart pressures   Recommendations: Medical management of mild CAD. Will resume IV heparin 6 hours post sheath pull given acute PE. Would transition to Eliquis or Xarelto tomorrow     NOTE by Dr. Mayford Knife 05/29/21 reads: "His SOB is far out of proportion to his very small PE.  Minimal CAD on cath and normal filling pressures on RHC with no pulmonary HTN.  RAP , PAP 25/6mmHg with mean PAP , mean PCWP and LVEDP and therefore not volume overloaded.    I personally reviewed the echo images and I feel that his RV is mildly dilated but RVF is normal and LVF is also normal.  I do not think his  SOB is cardiac related.  Agree with placing on DOAC for PE.  Recommend Pulmonary consult"   05/26/21: TTE (done during hospitalization for acute PE) IMPRESSIONS   1. Left ventricular ejection fraction, by estimation, is 45 to 50%. The  left ventricle has mildly decreased function. The left ventricle has no  regional wall motion abnormalities. There is severe asymmetric left  ventricular hypertrophy. Left ventricular   diastolic parameters are consistent with Grade I diastolic dysfunction  (  impaired relaxation).   2. Right ventricular systolic function is moderately reduced. The right  ventricular size is moderately enlarged. There is moderately elevated  pulmonary artery systolic pressure.   3. Left atrial size was moderately dilated.   4. Right atrial size was mildly dilated.   5. The mitral valve is normal in structure. Mild mitral valve  regurgitation.   6. The aortic valve is normal in structure. Aortic valve regurgitation is  not visualized. No aortic stenosis is present.   7. Aortic dilatation noted. There is mild dilatation of the ascending  aorta, measuring 41 mm.        02/08/21: TTE  1. Left ventricular ejection fraction, by estimation, is 60 to 65%. The  left ventricle has normal function. The left ventricle has no regional  wall motion abnormalities. There is moderate left ventricular hypertrophy.  Left ventricular diastolic  parameters are indeterminate.   2. Right ventricular systolic function is normal. The right ventricular  size is mildly enlarged. Tricuspid regurgitation signal is inadequate for  assessing PA pressure.   3. Left atrial size was moderately dilated.   4. Right atrial size was moderately dilated.   5. The mitral valve is normal in structure. Trivial mitral valve  regurgitation.   6. The aortic valve is tricuspid. Aortic valve regurgitation is not  visualized. No aortic stenosis is present.   7. Aortic dilatation noted. Aneurysm of the aortic root,  measuring 46 mm.  There is dilatation of the ascending aorta, measuring 41 mm.   8. The inferior vena cava is normal in size with greater than 50%  respiratory variability, suggesting right atrial pressure of 3 mmHg.    10/07/2018: EPS/Ablation (Dr. Ladona Ridgel) Conclusion: Successful EP study and catheter ablation of symptomatic PVCs originating from the RV inflow tract.  Successful ablation occurred just below the AV node and just apical of the His bundle region.  During RF energy application at the successful site, there was transient accelerated junctional rhythm.  However no heart block was demonstrated    Patient Profile     65 y.o. 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 Ladona Ridgel) with recurrence since then, RBBB, TAA, CKD (II), HTN, RA(chronic therapy with Humira, methotrexate, prednisone), OSA w/CPAP, June 2022 found with small PE advised to the ER 2/2 abnormal out patient heart monitor with VT and AF  Assessment & Plan    WCT is c/w VT + pt triggered episode, but no symptoms of near syncope or syncope Mostly palpitations with all his pt triggered episodes, some brief CP No VT here On propanolol, verapamil  C.MRI abnormal (report above) Planned for ICD implant today Alpha gal result is pending  Follow up procedural and post procedural questions answered, he remains agreeable for implant   2. AFib New diagnosis for him CHA2DS2Vasc is 43 (age, HTN, PE history and mild NOD) Already on eliquis for his PE Eliquis held for ICD implant today Resume post device implant pending pocket stability Pt is ambulatory and up ad lib, no SCDs   3. PVCs Hx of ablation 2019 12% burden on monitor Minimal here on current regime    4. Low back pain  Exacerbated by laying for MRI Getting percocet (is a home med for his arthritis) with short term relief   5. RA On home meds Humira not due until this Thursday Anticipate he will be home then   For questions or updates, please contact [facility] Please consult [email or web address] for contact info  under     Signed, Sheilah Pigeon, PA-C  08/07/2021, 8:05 AM    EP Attending  Patient seen and examined. Agree with the findings as noted above. The patient is stable over the weekend. I have reviewed the indications for ICD insertion. HCM and sustained monomorphic VT and he wishes to proceed.  Sharlot Gowda Janelys Glassner,MD

## 2021-08-08 ENCOUNTER — Inpatient Hospital Stay (HOSPITAL_COMMUNITY): Payer: No Typology Code available for payment source

## 2021-08-08 ENCOUNTER — Encounter (HOSPITAL_COMMUNITY): Payer: Self-pay | Admitting: Internal Medicine

## 2021-08-08 DIAGNOSIS — I472 Ventricular tachycardia: Secondary | ICD-10-CM | POA: Diagnosis not present

## 2021-08-08 DIAGNOSIS — I421 Obstructive hypertrophic cardiomyopathy: Secondary | ICD-10-CM | POA: Diagnosis not present

## 2021-08-08 LAB — BASIC METABOLIC PANEL
Anion gap: 9 (ref 5–15)
BUN: 16 mg/dL (ref 8–23)
CO2: 23 mmol/L (ref 22–32)
Calcium: 9.2 mg/dL (ref 8.9–10.3)
Chloride: 104 mmol/L (ref 98–111)
Creatinine, Ser: 1.04 mg/dL (ref 0.61–1.24)
GFR, Estimated: >60 mL/min (ref >60)
Glucose, Bld: 95 mg/dL (ref 70–99)
Potassium: 4.1 mmol/L (ref 3.5–5.1)
Sodium: 136 mmol/L (ref 135–145)

## 2021-08-08 IMAGING — CR DG CHEST 2V
2 series · 2 of 2 positions shown · non-contrast
Comparison: [DATE]

CLINICAL DATA: ICD placement

EXAM:
CHEST - 2 VIEW

[chest lat]
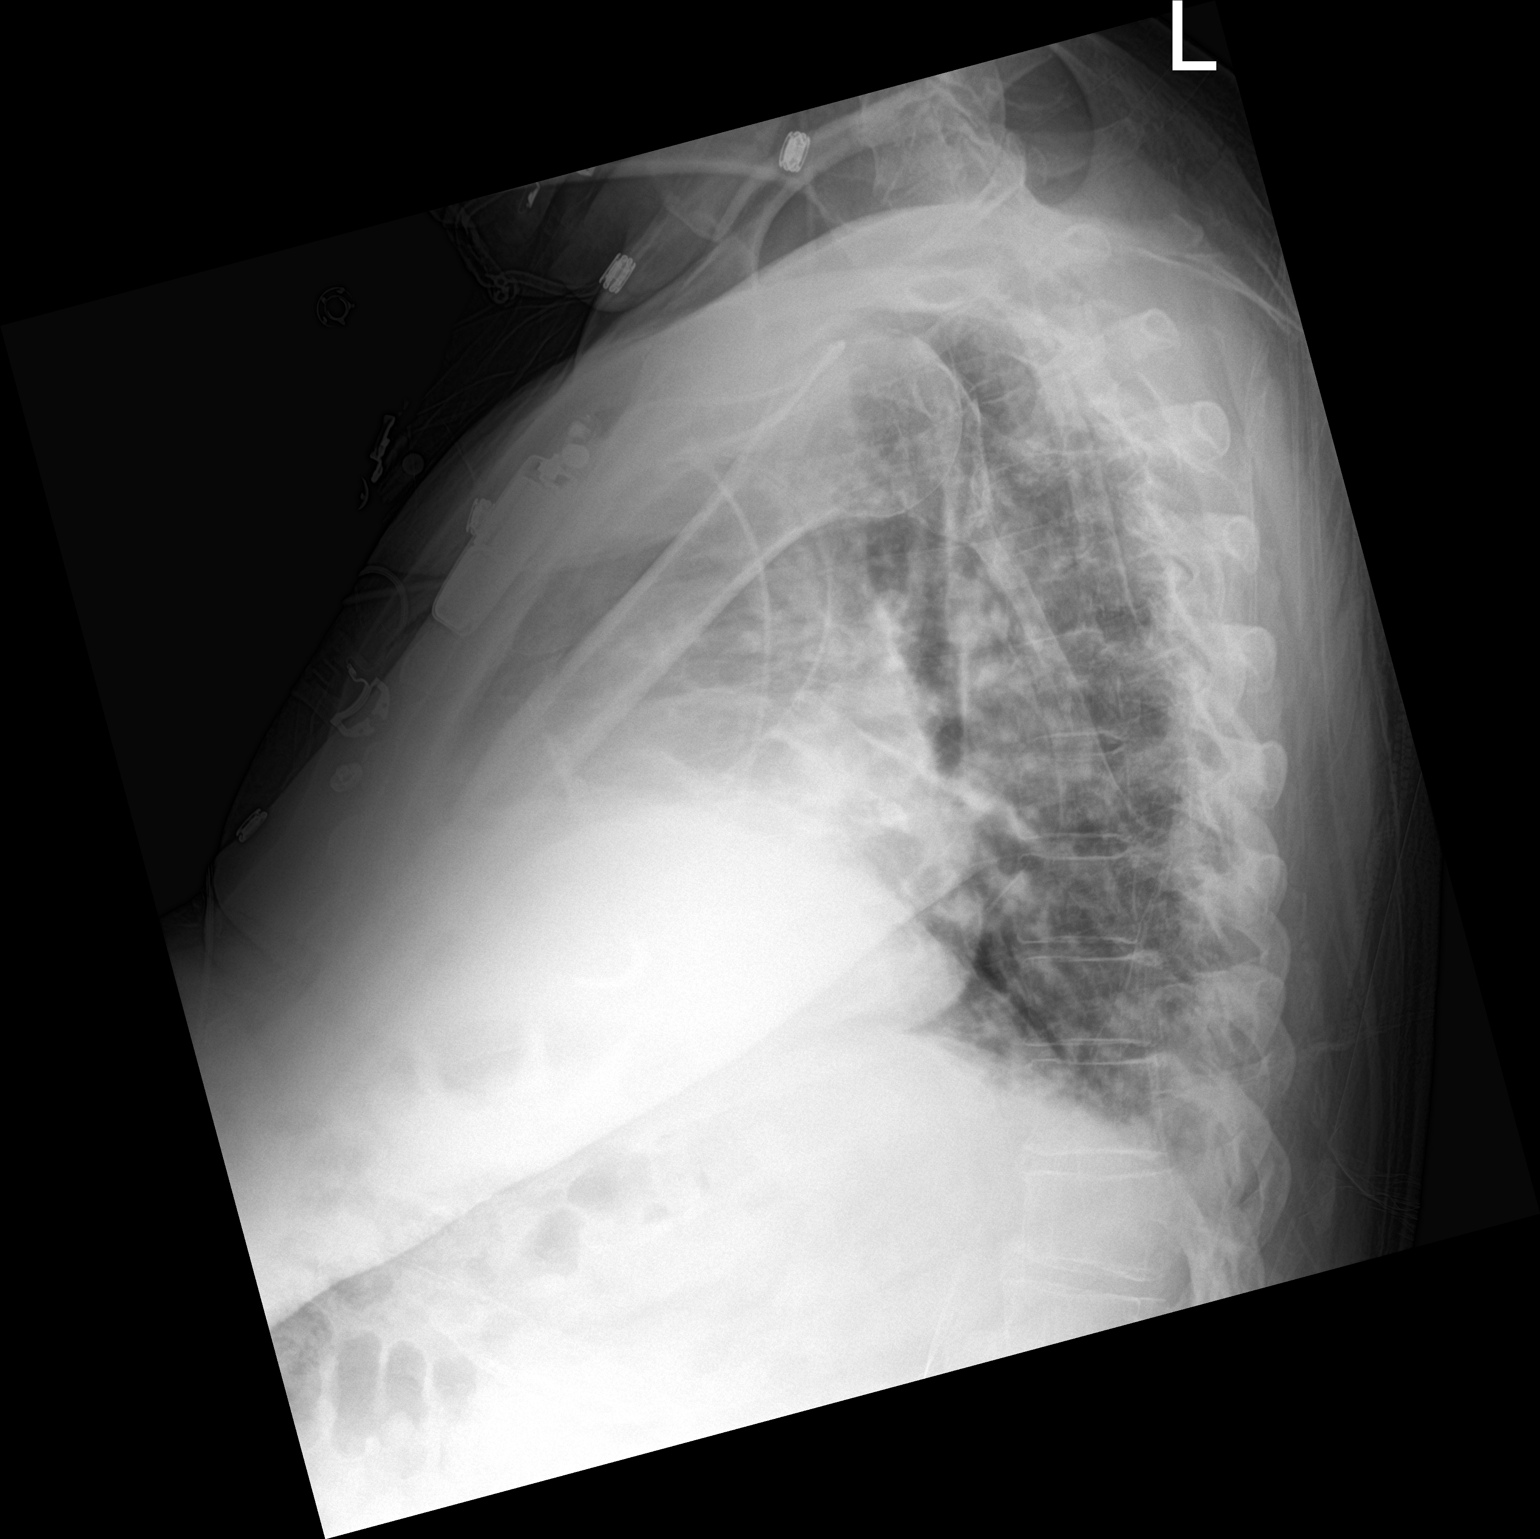

[chest ap]
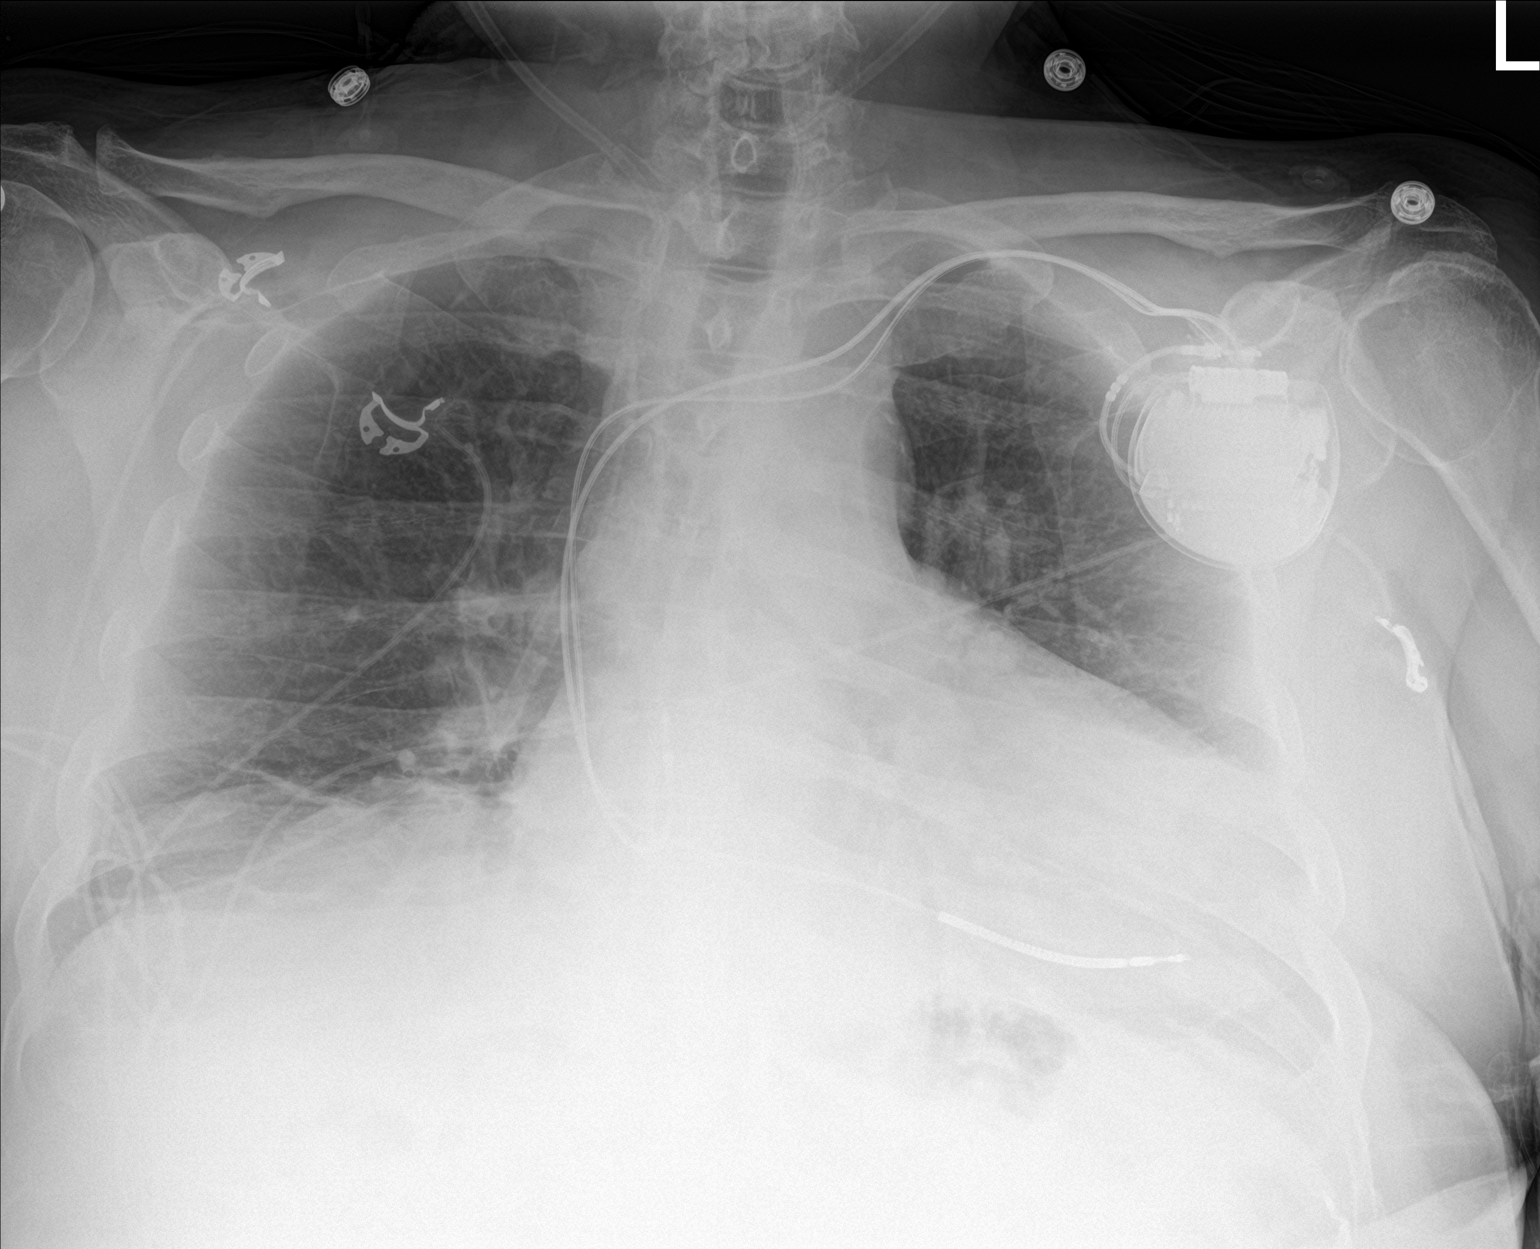

[2 of 2 positions shown; findings below may reference images not displayed]

FINDINGS: LEFT subclavian ICD with leads projecting at RIGHT atrium and RIGHT
ventricle.

Enlargement of cardiac silhouette with pulmonary vascular
congestion.

Atherosclerotic calcification aorta.

Decreased lung volumes with bibasilar atelectasis.

No gross infiltrate, pleural effusion or pneumothorax.

Osseous structures unremarkable.
IMPRESSION: No pneumothorax following ICD placement.

Enlargement of cardiac silhouette with pulmonary vascular
congestion.

Bibasilar atelectasis.

## 2021-08-08 MED ORDER — HYDRALAZINE HCL 50 MG PO TABS
50.0000 mg | ORAL_TABLET | Freq: Two times a day (BID) | ORAL | 6 refills | Status: DC
Start: 2021-08-08 — End: 2021-08-15

## 2021-08-08 MED ORDER — CARVEDILOL 6.25 MG PO TABS
6.2500 mg | ORAL_TABLET | Freq: Two times a day (BID) | ORAL | Status: DC
  Administered 2021-08-08: 6.25 mg via ORAL
  Filled 2021-08-08: qty 1

## 2021-08-08 MED ORDER — VERAPAMIL HCL ER 180 MG PO TBCR: 180.0000 mg | EXTENDED_RELEASE_TABLET | Freq: Every day | ORAL | 6 refills | Status: DC

## 2021-08-08 MED ORDER — CARVEDILOL 6.25 MG PO TABS: 6.2500 mg | ORAL_TABLET | Freq: Two times a day (BID) | ORAL | 6 refills | Status: DC

## 2021-08-08 MED ORDER — CHLORTHALIDONE 25 MG PO TABS
25.0000 mg | ORAL_TABLET | Freq: Every day | ORAL | Status: DC
  Administered 2021-08-08: 25 mg via ORAL
  Filled 2021-08-08: qty 1

## 2021-08-08 NOTE — Plan of Care (Signed)

## 2021-08-08 NOTE — Plan of Care (Signed)

## 2021-08-08 NOTE — Discharge Instructions (Signed)
    Supplemental Discharge Instructions for  Pacemaker/Defibrillator Patients   Activity No heavy lifting or vigorous activity with your left/right arm for 6 to 8 weeks.  Do not raise your left/right arm above your head for one week.  Gradually raise your affected arm as drawn below.              08/12/21                      08/13/21                    08/14/21                   08/15/21 __  NO DRIVING for  1 week   ; you may begin driving on   06/16/28  .  WOUND CARE Keep the wound area clean and dry.  Do not get this area wet , no showers for one week; you may shower on  08/15/21   . The tape/steri-strips on your wound will fall off; do not pull them off.  No bandage is needed on the site.  DO  NOT apply any creams, oils, or ointments to the wound area. If you notice any drainage or discharge from the wound, any swelling or bruising at the site, or you develop a fever > 101? F after you are discharged home, call the office at once.  Special Instructions You are still able to use cellular telephones; use the ear opposite the side where you have your pacemaker/defibrillator.  Avoid carrying your cellular phone near your device. When traveling through airports, show security personnel your identification card to avoid being screened in the metal detectors.  Ask the security personnel to use the hand wand. Avoid arc welding equipment, MRI testing (magnetic resonance imaging), TENS units (transcutaneous nerve stimulators).  Call the office for questions about other devices. Avoid electrical appliances that are in poor condition or are not properly grounded. Microwave ovens are safe to be near or to operate.  Additional information for defibrillator patients should your device go off: If your device goes off ONCE and you feel fine afterward, notify the device clinic nurses. If your device goes off ONCE and you do not feel well afterward, call 911. If your device goes off TWICE, call 911. If your  device goes off THREE times in one day, call 911.  DO NOT DRIVE YOURSELF OR A FAMILY MEMBER WITH A DEFIBRILLATOR TO THE HOSPITAL--CALL 911.

## 2021-08-09 MED ORDER — SPIRONOLACTONE 25 MG PO TABS: 12.5000 mg | ORAL_TABLET | Freq: Every day | ORAL | 0 refills | Status: DC

## 2021-08-09 NOTE — Telephone Encounter (Signed)
Tammy from Dr Rudene Re office returned my call and stated patient would be seen either Thursday or Friday this week due to hypoxia and recently discharged from hospital. Patient to go to ER if worsening of symptoms e.g. needing more than 4L oxygen or palpitations worsening.   Dr Francine Graven replied he is following up with Dr Excell Seltzer and Dr Mayford Knife (cardiology) to discuss patient case.  Patient contacted at 1453 via telephone.  He stated he received text from [facility] medication ready for pick up and daughter will pick up for him.  Tammy from Dr Rudene Re office called him and moved his appt to next Tuesday.  New appt date/time not in Epic yet on review.  Patient reported he has been resting in hammock and sp02 93% on 3L this afternoon.  Has noticed palpitations this afternoon at his throat states moderate in intensity.  Leg swelling mild per patient.  Feeling better this afternoon than this morning.  Patient notified I left messages for pulmonology/cardiology and spoke with Dr Rudene Re nurse.  I will call patient again tomorrow to follow up.  Patient can contact me if further questions or concerns.  Patient A&Ox3 spoke full sentences without difficulty A&Ox3 no cough/shortness of breath/wheezing/throat clearing or congestion audible during 4 minute telephone call.

## 2021-08-09 NOTE — Telephone Encounter (Signed)
RN Rolly Salter notified me patient discharged yesterday.  Attempted follow up with patient 08/09/21 via telephone no answer left message.  Patient had some medication changes to ensure he is remembering changes today/updating his pill minder at home.  STOP taking these medications     diltiazem 180 MG 24 hr capsule Commonly known as: Cardizem CD    nebivolol 5 MG tablet Commonly known as: BYSTOLIC           TAKE these medications     acetaminophen-codeine 300-30 MG tablet Commonly known as: TYLENOL #3 Take 1-2 tablets by mouth as needed for moderate pain.    albuterol 108 (90 Base) MCG/ACT inhaler Commonly known as: VENTOLIN HFA Inhale 2 puffs into the lungs every 4 (four) hours as needed for wheezing or shortness of breath.    apixaban 5 MG Tabs tablet Commonly known as: ELIQUIS Take 1 tablet (5 mg total) by mouth 2 (two) times daily. Notes to patient: Resume om Saturday 08/12/21    atorvastatin 40 MG tablet Commonly known as: LIPITOR Take 1 tablet (40 mg total) by mouth daily.    carvedilol 6.25 MG tablet Commonly known as: COREG Take 1 tablet (6.25 mg total) by mouth 2 (two) times daily with a meal.    chlorthalidone 25 MG tablet Commonly known as: HYGROTON Take 25 mg by mouth daily.    colchicine 0.6 MG tablet Take 1-2 tablets by mouth daily as needed (gout).    fluticasone 50 MCG/ACT nasal spray Commonly known as: FLONASE Place 1 spray into both nostrils daily.    Fluticasone-Salmeterol 250-50 MCG/DOSE Aepb Commonly known as: ADVAIR Inhale 1 puff into the lungs every 12 (twelve) hours.    folic acid 1 MG tablet Commonly known as: FOLVITE Take 1 mg by mouth daily.    furosemide 40 MG tablet Commonly known as: LASIX Take 1 tablet (40 mg total) by mouth as needed. Take 1 tablet by mouth daily for 3 days, then decrease to only as needed for increase weight gain    gabapentin 600 MG tablet Commonly known as: NEURONTIN Take 600 mg by mouth 3 (three) times daily.     Humira Pen 40 MG/0.4ML Pnkt Generic drug: Adalimumab Inject 40 mg as directed every 14 (fourteen) days.    hydrALAZINE 50 MG tablet Commonly known as: APRESOLINE Take 1 tablet (50 mg total) by mouth 2 (two) times daily. What changed:  medication strength how much to take when to take this    losartan 100 MG tablet Commonly known as: COZAAR Take 1 tablet (100 mg total) by mouth daily.    methotrexate 2.5 MG tablet Take 15 mg by mouth every Sunday.    montelukast 10 MG tablet Commonly known as: SINGULAIR Take 1 tablet (10 mg total) by mouth at bedtime.    Potassium Chloride ER 20 MEQ Tbcr Take 20 mEq by mouth in the morning and at bedtime.    predniSONE 5 MG tablet Commonly known as: DELTASONE Take 5 mg by mouth daily.    sildenafil 20 MG tablet Commonly known as: REVATIO Take 20 mg by mouth daily as needed (erectile dysfunction).    spironolactone 25 MG tablet Commonly known as: ALDACTONE Take 0.5 tablets (12.5 mg total) by mouth daily.    verapamil 180 MG CR tablet Commonly known as: CALAN-SR Take 1 tablet (180 mg total) by mouth daily. Start taking on: August 09, 2021  Discharge Care Instructions  (From admission, onward)                 Start     Ordered    08/08/21 0000   Discharge wound care:       Comments: As per AVS   08/08/21 1254                  Disposition:  Home       Discharge Instructions       Diet - low sodium heart healthy   Complete by: As directed      Discharge wound care:   Complete by: As directed      As per AVS    Increase activity slowly   Complete by: As directed             Follow-up Information       Marinus Maw, MD Follow up.   Specialty: Cardiology Why: 11/13/21 @ 3:45PM Contact information: [address] [place] [address] [phone]              Graciella Freer, PA-C Follow up.   Specialty: Physician Assistant Why: 08/18/21 @  10:20AM, wound check visit Contact information: [address] [place] [address] [phone]              Sheilah Pigeon, PA-C Follow up.   Specialty: Cardiology Why: 09/11/21 @ 8:45AM Contact information: [address] [place] [address] [phone]

## 2021-08-09 NOTE — Telephone Encounter (Signed)
Patient returned call stated still having some confusion since surgery/foggy memory and spo2 this am 86% wearing 3-4L oxygen home tank today when typically only needs 2L.  He stated [employer] nurse instructed him to increase to 3L.  Patient spouse and daughter at home with him today.  Hasn't been sleeping well.  Patient reported Monday night after surgery nurse told him he was trying to get out of bed and confused also.  Patient, daughter and spouse going through AVS paperwork today and noted he does not have any spironolactone at home.  Needs new Rx.  They contacted PCM office and soonest available appt next week Friday.  Hospital staff told daughter patient to be seen within 3 days after discharge from hospital.  Family members stated [facility] office staff notified of this but stated first available 1 week+.  Patient A&Ox3 spoke full sentences without difficulty on speakerphone with spouse/daughter and patient today.  Reviewed AVS medication and follow up instructions.  Discussed 1 lab test result still pending.  Question regarding if patient MRSA test prior to surgery positive 8/28.  Noted in Epic 3 MRSA tests negative and 1 staph aureus test positive on 8/28.  Patient has ointment to put on wounds from surgery.  Discussed to continue ointment use.  Spouse notified me she is still recovering from concussion after fall and was notified she also has broken bone in leg now in addition to wrist.  Adult daughter from [place] with parents at household to help with driving/etc at this time since patient instructed no driving for 1 week.  Daughter stated when patient at rest sp02 greater than 90 on 3L but when walking around drops to under 90.  Attempted to reach Dr Garnetta Buddy nurse [phone] but receive message that number not in service.  Attempted second contact number [phone] and was instructed to speak with Patty but she was assisting another patient left message.  [facility]  contacted and spironolactone 25mg  tabs in stock and Rx recevied electronically for patient refill that I sent earlier today sig t1/2 po daily #30 RF0.  Staff member stated text would be sent to patient when Rx available for pick up.  Pulmonology and cardiology team notified sent secure message patient with decreased sp02 today at home and patient not feeling his normal mental acuity but answering questions appropriately and speech clear.

## 2021-08-10 NOTE — Telephone Encounter (Signed)
Patient contacted via telephone stated took spironolactone around 1900 yesterday.  Noticed palpitations more when trying to fall asleep last night about the same as afternoon in hammock (medium) per patient.  This morning sp02 93-94% on O2 3L home oxygen slept well back pain resolved since ICD implant surgery.  Patient asked if any new instructions from Dr Francine Graven or Cardiology and I notified him Dr Francine Graven was speaking with Dr Turner/Dr Excell Seltzer but I did not see any new notes/instructions in Epic today.  Dr [name] appt states 08/16/21 in Epic.  Patient stated daughter set up his pill minder off his discharge paperwork yesterday to ensure all his doses/meds were updated in his minder to match new instructions.  Patient denied any worsening of palpitations or leg swelling today "usual"  Patient A&Ox3 spoke full sentences without difficulty no cough/throat clearing or congestion nasal audible during 5 minute telephone call.  Discussed with patient I would follow up with him via telephone again tomorrow.  Patient verbalized understanding information/instructions, agreed with plan of care and had no further questions at this time.

## 2021-08-12 NOTE — Telephone Encounter (Signed)
Patient contacted via telephone stated feeling well having a lazy day today.  Took nap in middle of the day for a couple hours.  Some pain when moving arm up to ear level had previous restrictions until today not to lift arm same side ICD implant.  Stated Dr Norris Cross nurse called him and he will see Dr Mayford Knife this week Tuesday at 1030 and Dr [name] at 1540.  Friday has follow up with PA Tillery for ICD at 1020.  Reminded patient he was to restart eliquis today.  Patient stated didn't wear oxygen during nap but did with CPAP at night and if active.  3 liters use continues.  92% spo02 this morning hasn't rechecked again today. Stated palpitations same denied worsening.  Leg swelling the usual.  Spoke full sentences without difficulty.  A&Ox3.  No audible wheezing, shortness of breath/congestion or throat clearing noted during 8 minute telephone call.  Patient stated [employer] case management nurse has been assigned to him and following up with him on a regular basis also.  Patient instructed to contact me this holiday weekend at [phone] if questions or concerns.  Patient verbalized understanding information/instructions, agreed with plan of care and had no further questions at this time.

## 2021-08-14 NOTE — Telephone Encounter (Signed)
Patient contacted via telephone stated yesterday was cooking without oxygen on and got short of breath.  Checked sp02 88% applied oxygen but was still short of breath until he sat down after cooking (stirring food).  Improved to 93% with oxygen when resting 3L.  Has not used albuterol inhaler only his steroid twice a day.  Weight yesterday 246lbs has not been taking extra fluid pill because Dr Ladona Ridgel told him to only take if weight greater than 250lbs.  BP this am before taking his medication 0800 166/94 HR 78 spo2 92% RA 0830 took meds  1100 rechecked BP 129/78 HR 82 sp02 94% with 3L oxygen.  He stated when walking up stairs without oxygen sp02 drops to 88%.  Did not notice any palpitations today.  Leg edema his usual sock line.  Patient has appts with [facility] and cardiology Dr Mayford Knife tomorrow.  Pulmonology scheduled for end of month.  He was asking if high resolution chest CT can be done at pulmonology office and I told him that I did not know and that would be a question for their office.  He asked if heart MRI done in hospital showed lungs-reviewed scan and it does not patient notified.  Reviewed Dr Francine Graven last note and PFTs, bubble echo have been completed.  Prone HR chest CT still pending.  Discussed with patient Dr Francine Graven wants patient to follow up with cardiology due to positive bubble echo. Discussed with patient I would follow up with him again on Wednesday 9/7 via telephone.  Patient A&Ox3 spoke full sentences without difficulty no wheezing/cough/congestion/shortness of breath/throat clearing noted during 17 minute telephone call.  Patient verbalized understanding information, agreed with plan of care and had no further questions at this time.

## 2021-08-15 ENCOUNTER — Telehealth: Payer: Self-pay | Admitting: Pulmonary Disease

## 2021-08-15 ENCOUNTER — Encounter: Payer: Self-pay | Admitting: Cardiology

## 2021-08-15 ENCOUNTER — Ambulatory Visit (INDEPENDENT_AMBULATORY_CARE_PROVIDER_SITE_OTHER): Payer: No Typology Code available for payment source | Admitting: Cardiology

## 2021-08-15 ENCOUNTER — Other Ambulatory Visit: Payer: Self-pay

## 2021-08-15 VITALS — BP 150/84 | HR 57 | Ht 71.0 in | Wt 252.2 lb

## 2021-08-15 DIAGNOSIS — R0600 Dyspnea, unspecified: Secondary | ICD-10-CM

## 2021-08-15 DIAGNOSIS — I422 Other hypertrophic cardiomyopathy: Secondary | ICD-10-CM | POA: Insufficient documentation

## 2021-08-15 DIAGNOSIS — I421 Obstructive hypertrophic cardiomyopathy: Secondary | ICD-10-CM | POA: Diagnosis not present

## 2021-08-15 DIAGNOSIS — I472 Ventricular tachycardia, unspecified: Secondary | ICD-10-CM

## 2021-08-15 DIAGNOSIS — R600 Localized edema: Secondary | ICD-10-CM

## 2021-08-15 DIAGNOSIS — I2510 Atherosclerotic heart disease of native coronary artery without angina pectoris: Secondary | ICD-10-CM

## 2021-08-15 DIAGNOSIS — Q2112 Patent foramen ovale: Secondary | ICD-10-CM

## 2021-08-15 DIAGNOSIS — R0609 Other forms of dyspnea: Secondary | ICD-10-CM

## 2021-08-15 DIAGNOSIS — Q211 Atrial septal defect: Secondary | ICD-10-CM | POA: Diagnosis not present

## 2021-08-15 DIAGNOSIS — I10 Essential (primary) hypertension: Secondary | ICD-10-CM

## 2021-08-15 DIAGNOSIS — I2699 Other pulmonary embolism without acute cor pulmonale: Secondary | ICD-10-CM

## 2021-08-15 LAB — BASIC METABOLIC PANEL
BUN/Creatinine Ratio: 15 (ref 10–24)
BUN: 18 mg/dL (ref 8–27)
CO2: 20 mmol/L (ref 20–29)
Calcium: 9.8 mg/dL (ref 8.6–10.2)
Chloride: 103 mmol/L (ref 96–106)
Creatinine, Ser: 1.19 mg/dL (ref 0.76–1.27)
Glucose: 82 mg/dL (ref 65–99)
Potassium: 4.2 mmol/L (ref 3.5–5.2)
Sodium: 138 mmol/L (ref 134–144)
eGFR: 68 mL/min/{1.73_m2} (ref >59)

## 2021-08-15 LAB — CBC
Hematocrit: 40.4 % (ref 37.5–51.0)
Hemoglobin: 13.8 g/dL (ref 13.0–17.7)
MCH: 33.7 pg — ABNORMAL HIGH (ref 26.6–33.0)
MCHC: 34.2 g/dL (ref 31.5–35.7)
MCV: 99 fL — ABNORMAL HIGH (ref 79–97)
Platelets: 193 10*3/uL (ref 150–450)
RBC: 4.09 x10E6/uL — ABNORMAL LOW (ref 4.14–5.80)
RDW: 13.6 % (ref 11.6–15.4)
WBC: 9.5 10*3/uL (ref 3.4–10.8)

## 2021-08-15 LAB — ALPHA GALACTOSIDASE: Alpha galactosidase, serum: 57.2 nmol/h/mg{prot}

## 2021-08-15 MED ORDER — HYDRALAZINE HCL 50 MG PO TABS: 50.0000 mg | ORAL_TABLET | Freq: Three times a day (TID) | ORAL | 3 refills | Status: DC

## 2021-08-15 NOTE — Telephone Encounter (Signed)
Patient was seen by [facility] and cardiology today. TEE scheduled for Friday.  PCM note still not fully available in Epic for review tonight.  Summary cardiology note PFO -noted to be small on recent echo with L?R shunting -doubt this is the cause of his SOB and ongoing hypoxemia but Pulmonary has no other etiology for persistent hypoxemia -will set up TEE to further evaluate -Shared Decision Making/Informed Consent The risks [esophageal damage, perforation (1:10,000 risk), bleeding, pharyngeal hematoma as well as other potential complications associated with conscious sedation including aspiration, arrhythmia, respiratory failure and death], benefits (treatment guidance and diagnostic support) and alternatives of a transesophageal echocardiogram were discussed in detail with Ryan Glass and he is willing to proceed.    HOCM -noted on recent cMRI done for workup fo VT -s/p AICD implant by Dr. Laurina Bustle -minimal on cath, R/L heart pressures normal.  -DOE out of proportion to CAD.   -continue statin and BB -no ASA due to DOAC   Dyspnea on exertion/COPD  -placed on home O2 by pulmonary for O2 sats in the 80s with walking.   -workup in progress for ongoing hypoxemia   HTN  -Bp borderline controlled on exam today -continue Losartan 100mg  daily, Chlorthalidone 25mg  daily, carvedilol 6.25mg  BID, spiro 12.5mg  daily and Verapamil 180mg  daily -increase Hydralazine to 50mg  TID -Bp check daily for a week and call with results   Lower extremity edema  -suspect secondary to high salt diet.   -Does have grade 1 DD on echo but had normal filling pressure on cath.   -Takes Lasix as needed.    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   -s/p AICD secondary to VT and dx of HOCM on cMRI -continue Verapamil 180mg  daily and Carvedilol 6.25mg  BID   Acute PE -followed by Pulmonary -small and not felt to be cause of persistent  hypoxemia -continue DOAC   Thoracic aortic aneurysm  -4.4 cm on CT angio 06/06/2021 -followed with yearly CTA

## 2021-08-15 NOTE — Telephone Encounter (Signed)
Called and spoke with patient. He stated that he was not requesting a CT but wanted to make sure that he does not need one. I reviewed his last OV with him as well as the PFT. He is aware that he does not need a CT.   Nothing further needed at time of call.

## 2021-08-15 NOTE — H&P (View-Only) (Signed)
 Cardiology Office Note    Date:  08/15/2021   ID:  [name], DOB 03/21/1956, MRN [id number]   PCP:  Aguiar, [name], MD   [facility]  Cardiologist:  Wilian Kwong, MD  Electrophysiologist:  Gregg Taylor, MD  Chief Complaint  Patient presents with   Follow-up    VT, hypoxemia, HTN, PVCs, CAD, LE edema, PE, aortic aneurysm     History of Present Illness:  Ryan Glass is a 65 y.o. male  with a hx of PVCs s/p PVC ablation by Dr. 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  He was found to have an acute PE with RV failure mildly reduced LVEF 45 to 50%.  Right and left heart cath 05/29/2021  showed 20% proximal RCA, 20% proximal to mid circumflex, 30% mid LAD normal right and left heart pressures.  His shortness of breath was felt to be out of proportion to his very small PE.  I reviewed the echo images and felt his RV was mildly dilated but RV EF was normal and LVEF was normal. He was referred for pulmonary consult due to ongoing SOB and restarted diltiazem 180 mg daily and Lasix was stopped because he was not fluid overloaded.  He was restarted on chlorthalidone 25 mg daily for hypertension.  He was see back in the office in August by Michelle Lenze, PA and was still complaining of DOE with little activity. He also was complaining of low HRs.  He saw pulmonary for Dyspnea on exertion/COPD placed on home O2 by pulmonary for O2 sats in the 80s with walking.   He was noted on an EKG done by PCP to have bradycardia and frequent PVC's. He was also complaining of complaining of leg swelling but had been eating fast food at least 3 times weekly.      He wore a heart monitor to assess bradycardia and PVC  load and was noted to have symptomatic ventricular tachycardia on monitor as well as PAF and was admitted to ER. Echo showed normal LVF with EF 60-65% with mild LVH, mildly reduced RVF, mild RVE and positive PFO.  He was started on Verapamil for AFib/VT/PVCs and monitored on tele.  cMRI showed findings c/w HOCM with LVEF 52% and RVEF 45%.  He underwent ICD placement on 08/07/2021. He was discharged home on Carvedilol and Verapamil.    He is now here today for workup for TEE which Pulmonary has requested due to ongoing DOE and O2 requirements to assess PFO noted on Echo.  After review of echo images and discussion with Dr. Cooper, it is felt that this is likely not the etiology of his ongoing hypoxemia but Pulmonary had no other etiology and has requested further workup of his PFO.  He says that he occasionally will feel chest pressure when coughing and still feels SOB.  He occasionally as some mild RLE edema at the ankle.  Denies any palpitations, dizziness or syncope.     Past Medical History:  Diagnosis Date   Ascending aortic aneurysm (HCC)    a. CT 05/2021 showed 4.4x4.4cm TAA.   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 There is severe asymmetric left ventricular hypertrophy. G1DD present.    History of COVID-19 06/07/2021   HOCM (hypertrophic obstructive cardiomyopathy) (HCC)    s/p ICD 07/2021   Hypertension    followed by cardiology, dr t. Shequila Neglia   (05-14-2018 nuclear study in epic , normal perfusion with nuclear ef 61%)   Left hydrocele    Low serum potassium level 04/27/2014   OSA on CPAP    per pt uses every night    Palpitations followed by dr t. Duane Trias   06-08-2020  still feels palipations due to PVCs when exertion but not with chest pain/ discomfort   Pneumonia due to COVID-19 virus    PVC's (premature ventricular contractions) cardiologist--- dr t. Dotti Busey   Status post PVC ablation by Dr. Taylor 2019 with recurrence of frequent PVCs/bigeminy;  prior pseudobradycardia r/t pvcs   Rheumatoid arthritis involving multiple sites (HCC)    rheumotology--- dr a. ziolkowska  ([facility] in [place])    Past Surgical History:  Procedure Laterality Date   HYDROCELE EXCISION Left 06/14/2020   Procedure: LEFT  HYDROCELECTOMY ADULT;  Surgeon: Pace, Maryellen D, MD;  Location: [facility];  Service: Urology;  Laterality: Left;   ICD IMPLANT N/A 08/07/2021   Procedure: ICD IMPLANT;  Surgeon: Taylor, Gregg W, MD;  Location: [facility];  Service: Cardiovascular;  Laterality: N/A;   INCISIONAL HERNIA REPAIR  02-23-2016   @[facility]   LAPAROSCOPIC   LAPAROSCOPIC INGUINAL HERNIA REPAIR Bilateral 08-22-2015  @[facility]   AND UMBILICAL HERNIA REPAIR   PVC ABLATION N/A 10/07/2018   Procedure: PVC ABLATION;  Surgeon: Taylor, Gregg W, MD;  Location: [facility];  Service: Cardiovascular;  Laterality: N/A;   RIGHT/LEFT HEART CATH AND CORONARY ANGIOGRAPHY N/A 05/29/2021   Procedure: RIGHT/LEFT HEART CATH AND CORONARY ANGIOGRAPHY;  Surgeon: McAlhany, Christopher D, MD;  Location: [facility];  Service: Cardiovascular;  Laterality: N/A;   UMBILICAL HERNIA REPAIR  child    Current Medications: Current Meds  Medication Sig   acetaminophen-codeine (TYLENOL #3) 300-30 MG tablet Take 1-2 tablets by mouth as needed for moderate pain.   albuterol (VENTOLIN HFA) 108 (90 Base) MCG/ACT inhaler Inhale 2 puffs into the lungs every 4 (four) hours as needed for wheezing or shortness of breath.   apixaban (ELIQUIS) 5 MG TABS tablet Take 1 tablet (5 mg total) by mouth 2 (two) times daily.   atorvastatin (LIPITOR) 40 MG tablet  Take 1 tablet (40 mg total) by mouth daily.   carvedilol (COREG) 6.25 MG tablet Take 1 tablet (6.25 mg total) by mouth 2 (two) times daily with a meal.   chlorthalidone (HYGROTON) 25 MG tablet Take 25 mg by mouth daily.   colchicine 0.6 MG tablet Take 1-2 tablets by mouth daily as needed (gout).    fluticasone (FLONASE) 50 MCG/ACT nasal spray Place 1 spray into both nostrils daily.   Fluticasone-Salmeterol (ADVAIR) 250-50 MCG/DOSE AEPB Inhale 1 puff into the lungs every 12 (twelve) hours.   folic acid (FOLVITE) 1 MG tablet Take 1 mg by mouth daily.   furosemide (LASIX) 40 MG tablet Take 1 tablet (40 mg total) by mouth as needed. Take 1 tablet   by mouth daily for 3 days, then decrease to only as needed for increase weight gain   gabapentin (NEURONTIN) 600 MG tablet Take 600 mg by mouth 3 (three) times daily.    HUMIRA PEN 40 MG/0.4ML PNKT Inject 40 mg as directed every 14 (fourteen) days.   hydrALAZINE (APRESOLINE) 50 MG tablet Take 1 tablet (50 mg total) by mouth 3 (three) times daily.   losartan (COZAAR) 100 MG tablet Take 1 tablet (100 mg total) by mouth daily.   methotrexate 2.5 MG tablet Take 15 mg by mouth every Sunday.   montelukast (SINGULAIR) 10 MG tablet Take 1 tablet (10 mg total) by mouth at bedtime.   Potassium Chloride ER 20 MEQ TBCR Take 20 mEq by mouth in the morning and at bedtime.   predniSONE (DELTASONE) 5 MG tablet Take 5 mg by mouth daily.   sildenafil (REVATIO) 20 MG tablet Take 20 mg by mouth daily as needed (erectile dysfunction).   spironolactone (ALDACTONE) 25 MG tablet Take 0.5 tablets (12.5 mg total) by mouth daily.   verapamil (CALAN-SR) 180 MG CR tablet Take 1 tablet (180 mg total) by mouth daily.   [DISCONTINUED] hydrALAZINE (APRESOLINE) 50 MG tablet Take 1 tablet (50 mg total) by mouth 2 (two) times daily.     Allergies:   Amlodipine besylate and Aspirin   Social History   Socioeconomic History   Marital status: Married    Spouse name: Not on file   Number  of children: Not on file   Years of education: Not on file   Highest education level: Not on file  Occupational History   Not on file  Tobacco Use   Smoking status: Never   Smokeless tobacco: Never  Vaping Use   Vaping Use: Never used  Substance and Sexual Activity   Alcohol use: Yes    Alcohol/week: 5.0 - 7.0 standard drinks    Types: 5 - 7 Cans of beer per week   Drug use: Never   Sexual activity: Not on file  Other Topics Concern   Not on file  Social History Narrative   Not on file   Social Determinants of Health   Financial Resource Strain: Not on file  Food Insecurity: Not on file  Transportation Needs: Not on file  Physical Activity: Not on file  Stress: Not on file  Social Connections: Not on file     Family History:  The patient's  family history includes Cardiomyopathy in his mother; Heart attack in his father.   ROS:   Please see the history of present illness.    ROS All other systems reviewed and are negative.   PHYSICAL EXAM:   VS:  BP (!) 150/84   Pulse (!) 57   Ht 5' 11" (1.803 m)   Wt 252 lb 3.2 oz (114.4 kg)   SpO2 95%   BMI 35.17 kg/m   Physical Exam  GEN: Well nourished, well developed in no acute distress HEENT: Normal NECK: No JVD; No carotid bruits LYMPHATICS: No lymphadenopathy CARDIAC:RRR, no murmurs, rubs, gallops RESPIRATORY:  Clear to auscultation without rales, wheezing or rhonchi  ABDOMEN: Soft, non-tender, non-distended MUSCULOSKELETAL:  No edema; No deformity  SKIN: Warm and dry NEUROLOGIC:  Alert and oriented x 3 PSYCHIATRIC:  Normal affect   Wt Readings from Last 3 Encounters:  08/15/21 252 lb 3.2 oz (114.4 kg)  08/02/21 253 lb 1.4 oz (114.8 kg)  07/20/21 254 lb (115.2 kg)      Studies/Labs Reviewed:   EKG:    EKG is not ordered today.     Recent Labs: 02/06/2021: NT-Pro BNP 47 06/06/2021: TSH 0.975 08/02/2021: ALT 26; B Natriuretic Peptide 50.5; Hemoglobin 13.4; Magnesium 2.0; Platelets 168 08/08/2021: BUN 16;  Creatinine, Ser 1.04; Potassium 4.1; Sodium 136   Lipid Panel    Component Value Date/Time   CHOL 123 05/27/2021 0138   CHOL 136 02/20/2021 1215   TRIG 67 05/27/2021 0138   HDL 49 05/27/2021 0138   HDL 64 02/20/2021 1215   CHOLHDL 2.5 05/27/2021 0138   VLDL 13 05/27/2021 0138   LDLCALC 61 05/27/2021 0138   LDLCALC 60 02/20/2021 1215    Additional studies/ records that were reviewed today include:  Cardiac Cath 05/29/2021 reviewed as below:   Conclusion     Prox RCA lesion is 20% stenosed. Prox Cx to Mid Cx lesion is 20% stenosed. Mid LAD lesion is 30% stenosed.   Mild non-obstructive CAD Normal right and left heart pressures   Recommendations: Medical management of mild CAD. Will resume IV heparin 6 hours post sheath pull given acute PE. Would transition to Eliquis or Xarelto tomorrow.   Echo 05/26/21 IMPRESSIONS     1. Left ventricular ejection fraction, by estimation, is 45 to 50%. The  left ventricle has mildly decreased function. The left ventricle has no  regional wall motion abnormalities. There is severe asymmetric left  ventricular hypertrophy. Left ventricular   diastolic parameters are consistent with Grade I diastolic dysfunction  (impaired relaxation).   2. Right ventricular systolic function is moderately reduced. The right  ventricular size is moderately enlarged. There is moderately elevated  pulmonary artery systolic pressure.   3. Left atrial size was moderately dilated.   4. Right atrial size was mildly dilated.   5. The mitral valve is normal in structure. Mild mitral valve  regurgitation.   6. The aortic valve is normal in structure. Aortic valve regurgitation is  not visualized. No aortic stenosis is present.   7. Aortic dilatation noted. There is mild dilatation of the ascending  aorta, measuring 41 mm.    CT 06/07/21 IMPRESSION: 1. Bland appearing, bandlike scarring and or partial atelectasis of the dependent lungs, unchanged compared to  prior examination. No evidence of fibrotic interstitial lung disease. No significant air trapping on expiratory phase imaging. 2. Cardiomegaly and coronary artery disease. 3. Unchanged enlargement of the tubular ascending thoracic aorta, measuring up to 4.4 x 4.4 cm. Recommend annual imaging followup by CTA or MRA if not otherwise imaged. This recommendation follows 2010 ACCF/AHA/AATS/ACR/ASA/SCA/SCAI/SIR/STS/SVM Guidelines for the Diagnosis and Management of Patients with Thoracic Aortic Disease. Circulation. 2010; 121: E266-e369. Aortic aneurysm NOS (ICD10-I71.9)   Aortic Atherosclerosis (ICD10-I70.0).     Electronically Signed   By: Alex  Bibbey M.D.   On: 06/07/2021 15:12  cMRI 07/2021 IMPRESSION: 1. Asymmetric LV hypertrophy measuring 18mm in basal septum (10mm in basal lateral wall). This meets criteria for hypertrophic cardiomyopathy   2. Patchy late gadolinium enhancement at RV insertion site and basal lateral wall. LGE accounts for 1% of total myocardial mass. While this LGE pattern is consistent with HCM, would also consider Fabry disease as it can present with basal lateral LGE and LVH. Would recommend checking alpha-galactosidase A level   3. Normal LV size and systolic function (EF 52%), though was difficult to quantify volumes/EF due to significant motion artifact during acquisition   4.  Normal RV size with mild systolic dysfunction (EF 45%)   5.  Small pericardial effusion   6.  Dilated ascending   aorta measuring 43mm  Risk Assessment/Calculations:         ASSESSMENT:    1. PFO (patent foramen ovale)   2. HOCM (hypertrophic obstructive cardiomyopathy) (HCC)   3. Coronary artery disease involving native coronary artery of native heart without angina pectoris   4. DOE (dyspnea on exertion)   5. Essential hypertension   6. Leg edema   7. Ventricular tachycardia (HCC)   8. Other acute pulmonary embolism, unspecified whether acute cor pulmonale present  (HCC)      PLAN:   PFO -noted to be small on recent echo with L?R shunting -doubt this is the cause of his SOB and ongoing hypoxemia but Pulmonary has no other etiology for persistent hypoxemia -will set up TEE to further evaluate -Shared Decision Making/Informed Consent The risks [esophageal damage, perforation (1:10,000 risk), bleeding, pharyngeal hematoma as well as other potential complications associated with conscious sedation including aspiration, arrhythmia, respiratory failure and death], benefits (treatment guidance and diagnostic support) and alternatives of a transesophageal echocardiogram were discussed in detail with Mr. Herrig and he is willing to proceed.   HOCM -noted on recent cMRI done for workup fo VT -s/p AICD implant by Dr. Taylor  ASCAD -minimal on cath, R/L heart pressures normal.  -DOE out of proportion to CAD.   -continue statin and BB -no ASA due to DOAC  Dyspnea on exertion/COPD  -placed on home O2 by pulmonary for O2 sats in the 80s with walking.   -workup in progress for ongoing hypoxemia  HTN  -Bp borderline controlled on exam today -continue Losartan 100mg daily, Chlorthalidone 25mg daily, carvedilol 6.25mg BID, spiro 12.5mg daily and Verapamil 180mg daily -increase Hydralazine to 50mg TID -Bp check daily for a week and call with results  Lower extremity edema  -suspect secondary to high salt diet.   -Does have grade 1 DD on echo but had normal filling pressure on cath.   -Takes Lasix as needed.   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   -s/p AICD secondary to VT and dx of HOCM on cMRI -continue Verapamil 180mg daily and Carvedilol 6.25mg BID  Acute PE -followed by Pulmonary -small and not felt to be cause of persistent hypoxemia -continue DOAC  Thoracic aortic aneurysm  -4.4 cm on CT angio 06/06/2021 -followed with yearly CTA  Shared Decision Making/Informed Consent         Medication Adjustments/Labs and Tests Ordered: Current medicines are reviewed at length with the patient today.  Concerns regarding medicines are outlined above.  Medication changes, Labs and Tests ordered today are listed in the Patient Instructions below. Patient Instructions  Medication Instructions:  Your physician recommends that you continue on your current medications as directed. Please refer to the Current Medication list given to you today.  *If you need a refill on your cardiac medications before your next appointment, please call your pharmacy*   Lab Work: TODAY: BMET and CBC If you have labs (blood work) drawn today and your tests are completely normal, you will receive your results only by: MyChart Message (if you have MyChart) OR A paper copy in the mail If you have any lab test that is abnormal or we need to change your treatment, we will call you to review the results.   Testing/Procedures: Your physician has requested that you have a TEE. During a TEE, sound waves are used to create images of your heart. It provides your doctor with information about   the size and shape of your heart and how well your heart's chambers and valves are working. In this test, a transducer is attached to the end of a flexible tube that's guided down your throat and into your esophagus (the tube leading from you mouth to your stomach) to get a more detailed image of your heart. You are not awake for the procedure. Please see the instruction sheet given to you today. For further information please visit [email or web address].   Follow-Up: At [facility], you and your health needs are our priority.  As part of our continuing mission to provide you with exceptional heart care, we have created designated Provider Care Teams.  These Care Teams include your primary Cardiologist (physician) and Advanced Practice Providers (APPs -  Physician Assistants and Nurse Practitioners) who all work  together to provide you with the care you need, when you need it.    Other Instructions You are scheduled for a TEE on Friday, September 9th with Dr. Nishan.  Please arrive at the [facility] Tower (Main Entrance A) at [facility]: [address] [place], [place] [address] at 11:30am am. (1 hour prior to procedure unless lab work is needed; if lab work is needed arrive 1.5 hours ahead)  DIET: Nothing to eat or drink after midnight except a sip of water with medications (see medication instructions below)  FYI: For your safety, and to allow us to monitor your vital signs accurately during the surgery/procedure we request that   if you have artificial nails, gel coating, SNS etc. Please have those removed prior to your surgery/procedure. Not having the nail coverings /polish removed may result in cancellation or delay of your surgery/procedure.   Medication Instructions: Hold chlorhtalidone, furosemide, and spironolactone the morning of your procedure  Continue your anticoagulant: Eliquis You will need to continue your anticoagulant after your procedure until you  are told by your  Provider that it is safe to stop   Labs: TODAY  You must have a responsible person to drive you home and stay in the waiting area during your procedure. Failure to do so could result in cancellation.  Bring your insurance cards.  *Special Note: Every effort is made to have your procedure done on time. Occasionally there are emergencies that occur at the hospital that may cause delays. Please be patient if a delay does occur.     Signed, Lamonte Hartt, MD  08/15/2021 11:00 AM    [facility] [address], [place], [place]  [address] Phone: [phone]; Fax: [phone]    

## 2021-08-15 NOTE — Progress Notes (Signed)
Cardiology Office Note    Date:  08/15/2021   ID:  Ryan Glass, DOB 1956/12/04, MRN [id number]   PCP:  [name], MD   [facility]  Cardiologist:  [name], MD  Electrophysiologist:  [name], MD  Chief Complaint  Patient presents with   Follow-up    VT, hypoxemia, HTN, PVCs, CAD, LE edema, PE, aortic aneurysm     History of Present Illness:  Ryan Glass is a 65 y.o. male  with a hx of PVCs s/p PVC ablation by Dr. 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  He was found to have an acute PE with RV failure mildly reduced LVEF 45 to 50%.  Right and left heart cath 05/29/2021  showed 20% proximal RCA, 20% proximal to mid circumflex, 30% mid LAD normal right and left heart pressures.  His shortness of breath was felt to be out of proportion to his very small PE.  I reviewed the echo images and felt his RV was mildly dilated but RV EF was normal and LVEF was normal. He was referred for pulmonary consult due to ongoing SOB and restarted diltiazem 180 mg daily and Lasix was stopped because he was not fluid overloaded.  He was restarted on chlorthalidone 25 mg daily for hypertension.  He was see back in the office in August by Herma Carson, PA and was still complaining of DOE with little activity. He also was complaining of low HRs.  He saw pulmonary for Dyspnea on exertion/COPD placed on home O2 by pulmonary for O2 sats in the 80s with walking.   He was noted on an EKG done by PCP to have bradycardia and frequent PVC's. He was also complaining of complaining of leg swelling but had been eating fast food at least 3 times weekly.      He wore a heart monitor to assess bradycardia and PVC  load and was noted to have symptomatic ventricular tachycardia on monitor as well as PAF and was admitted to ER. Echo showed normal LVF with EF 60-65% with mild LVH, mildly reduced RVF, mild RVE and positive PFO.  He was started on Verapamil for AFib/VT/PVCs and monitored on tele.  cMRI showed findings c/w HOCM with LVEF 52% and RVEF 45%.  He underwent ICD placement on 08/07/2021. He was discharged home on Carvedilol and Verapamil.    He is now here today for workup for TEE which Pulmonary has requested due to ongoing DOE and O2 requirements to assess PFO noted on Echo.  After review of echo images and discussion with Dr. Excell Seltzer, it is felt that this is likely not the etiology of his ongoing hypoxemia but Pulmonary had no other etiology and has requested further workup of his PFO.  He says that he occasionally will feel chest pressure when coughing and still feels SOB.  He occasionally as some mild RLE edema at the ankle.  Denies any palpitations, dizziness or syncope.     Past Medical History:  Diagnosis Date   Ascending aortic aneurysm (HCC)    a. CT 05/2021 showed 4.4x4.4cm TAA.   Bilateral lower extremity edema    Chronic gout without tophus    followed by dr Sharmon Revere    (06-08-2020  per  pt last episode right knee 3 wks ago)   CKD (chronic kidney disease), stage II    nephrology--- Virgina Norfolk PA (10-29-2019 note in epic scanned in  media)   Heart failure with mid-range ejection fraction [facility]) 05/27/2021   05/26/21: Left ventricular ejection fraction, by estimation, is 45 to 50%. There is severe asymmetric left ventricular hypertrophy. G1DD present.    History of COVID-19 06/07/2021   HOCM (hypertrophic obstructive cardiomyopathy) (HCC)    s/p ICD 07/2021   Hypertension    followed by cardiology, dr t. Varvara Legault   (05-14-2018 nuclear study in epic , normal perfusion with nuclear ef 61%)   Left hydrocele    Low serum potassium level 04/27/2014   OSA on CPAP    per pt uses every night    Palpitations followed by dr t. Mayford Knife   06-08-2020  still feels palipations due to PVCs when exertion but not with chest pain/ discomfort   Pneumonia due to COVID-19 virus    PVC's (premature ventricular contractions) cardiologist--- dr t. Deissy Guilbert   Status post PVC ablation by Dr. Ladona Ridgel 2019 with recurrence of frequent PVCs/bigeminy;  prior pseudobradycardia r/t pvcs   Rheumatoid arthritis involving multiple sites [facility])    rheumotology--- dr a. Sharmon Revere  ([facility] in [place])    Past Surgical History:  Procedure Laterality Date   HYDROCELE EXCISION Left 06/14/2020   Procedure: LEFT  HYDROCELECTOMY ADULT;  Surgeon: Noel Christmas, MD;  [facility]: [facility];  Service: Urology;  Laterality: Left;   ICD IMPLANT N/A 08/07/2021   Procedure: ICD IMPLANT;  Surgeon: Marinus Maw, MD;  Location: [facility] INVASIVE CV LAB;  Service: Cardiovascular;  Laterality: N/A;   INCISIONAL HERNIA REPAIR  02-23-2016   @[facility]    LAPAROSCOPIC   LAPAROSCOPIC INGUINAL HERNIA REPAIR Bilateral 08-22-2015  @[facility]    AND UMBILICAL HERNIA REPAIR   PVC ABLATION N/A 10/07/2018   Procedure: PVC ABLATION;  Surgeon: , MD;  Location: [facility] INVASIVE CV LAB;  Service: Cardiovascular;  Laterality: N/A;   RIGHT/LEFT HEART CATH AND CORONARY ANGIOGRAPHY N/A 05/29/2021   Procedure: RIGHT/LEFT HEART CATH AND CORONARY ANGIOGRAPHY;  Surgeon: Marinus Maw, MD;  Location: [facility] INVASIVE CV LAB;  Service: Cardiovascular;  Laterality: N/A;   UMBILICAL HERNIA REPAIR  child    Current Medications: Current Meds  Medication Sig   acetaminophen-codeine (TYLENOL #3) 300-30 MG tablet Take 1-2 tablets by mouth as needed for moderate pain.   albuterol (VENTOLIN HFA) 108 (90 Base) MCG/ACT inhaler Inhale 2 puffs into the lungs every 4 (four) hours as needed for wheezing or shortness of breath.   apixaban (ELIQUIS) 5 MG TABS tablet Take 1 tablet (5 mg total) by mouth 2 (two) times daily.   atorvastatin (LIPITOR) 40 MG tablet  Take 1 tablet (40 mg total) by mouth daily.   carvedilol (COREG) 6.25 MG tablet Take 1 tablet (6.25 mg total) by mouth 2 (two) times daily with a meal.   chlorthalidone (HYGROTON) 25 MG tablet Take 25 mg by mouth daily.   colchicine 0.6 MG tablet Take 1-2 tablets by mouth daily as needed (gout).    fluticasone (FLONASE) 50 MCG/ACT nasal spray Place 1 spray into both nostrils daily.   Fluticasone-Salmeterol (ADVAIR) 250-50 MCG/DOSE AEPB Inhale 1 puff into the lungs every 12 (twelve) hours.   folic acid (FOLVITE) 1 MG tablet Take 1 mg by mouth daily.   furosemide (LASIX) 40 MG tablet Take 1 tablet (40 mg total) by mouth as needed. Take 1 tablet  by mouth daily for 3 days, then decrease to only as needed for increase weight gain   gabapentin (NEURONTIN) 600 MG tablet Take 600 mg by mouth 3 (three) times daily.    HUMIRA PEN 40 MG/0.4ML PNKT Inject 40 mg as directed every 14 (fourteen) days.   hydrALAZINE (APRESOLINE) 50 MG tablet Take 1 tablet (50 mg total) by mouth 3 (three) times daily.   losartan (COZAAR) 100 MG tablet Take 1 tablet (100 mg total) by mouth daily.   methotrexate 2.5 MG tablet Take 15 mg by mouth every Sunday.   montelukast (SINGULAIR) 10 MG tablet Take 1 tablet (10 mg total) by mouth at bedtime.   Potassium Chloride ER 20 MEQ TBCR Take 20 mEq by mouth in the morning and at bedtime.   predniSONE (DELTASONE) 5 MG tablet Take 5 mg by mouth daily.   sildenafil (REVATIO) 20 MG tablet Take 20 mg by mouth daily as needed (erectile dysfunction).   spironolactone (ALDACTONE) 25 MG tablet Take 0.5 tablets (12.5 mg total) by mouth daily.   verapamil (CALAN-SR) 180 MG CR tablet Take 1 tablet (180 mg total) by mouth daily.   [DISCONTINUED] hydrALAZINE (APRESOLINE) 50 MG tablet Take 1 tablet (50 mg total) by mouth 2 (two) times daily.     Allergies:   Amlodipine besylate and Aspirin   Social History   Socioeconomic History   Marital status: Married    Spouse name: Not on file   Number  of children: Not on file   Years of education: Not on file   Highest education level: Not on file  Occupational History   Not on file  Tobacco Use   Smoking status: Never   Smokeless tobacco: Never  Vaping Use   Vaping Use: Never used  Substance and Sexual Activity   Alcohol use: Yes    Alcohol/week: 5.0 - 7.0 standard drinks    Types: 5 - 7 Cans of beer per week   Drug use: Never   Sexual activity: Not on file  Other Topics Concern   Not on file  Social History Narrative   Not on file   Social Determinants of Health   Financial Resource Strain: Not on file  Food Insecurity: Not on file  Transportation Needs: Not on file  Physical Activity: Not on file  Stress: Not on file  Social Connections: Not on file     Family History:  The patient's  family history includes Cardiomyopathy in his mother; Heart attack in his father.   ROS:   Please see the history of present illness.    ROS All other systems reviewed and are negative.   PHYSICAL EXAM:   VS:  BP (!) 150/84   Pulse (!) 57   Ht 5\' 11"  (1.803 m)   Wt 252 lb 3.2 oz (114.4 kg)   SpO2 95%   BMI 35.17 kg/m   Physical Exam  GEN: Well nourished, well developed in no acute distress HEENT: Normal NECK: No JVD; No carotid bruits LYMPHATICS: No lymphadenopathy CARDIAC:RRR, no murmurs, rubs, gallops RESPIRATORY:  Clear to auscultation without rales, wheezing or rhonchi  ABDOMEN: Soft, non-tender, non-distended MUSCULOSKELETAL:  No edema; No deformity  SKIN: Warm and dry NEUROLOGIC:  Alert and oriented x 3 PSYCHIATRIC:  Normal affect   Wt Readings from Last 3 Encounters:  08/15/21 252 lb 3.2 oz (114.4 kg)  08/02/21 253 lb 1.4 oz (114.8 kg)  07/20/21 254 lb (115.2 kg)      Studies/Labs Reviewed:   EKG:  EKG is not ordered today.     Recent Labs: 02/06/2021: NT-Pro BNP 47 06/06/2021: TSH 0.975 08/02/2021: ALT 26; B Natriuretic Peptide 50.5; Hemoglobin 13.4; Magnesium 2.0; Platelets 168 08/08/2021: BUN 16;  Creatinine, Ser 1.04; Potassium 4.1; Sodium 136   Lipid Panel    Component Value Date/Time   CHOL 123 05/27/2021 0138   CHOL 136 02/20/2021 1215   TRIG 67 05/27/2021 0138   HDL 49 05/27/2021 0138   HDL 64 02/20/2021 1215   CHOLHDL 2.5 05/27/2021 0138   VLDL 13 05/27/2021 0138   LDLCALC 61 05/27/2021 0138   LDLCALC 60 02/20/2021 1215    Additional studies/ records that were reviewed today include:  Cardiac Cath 05/29/2021 reviewed as below:   Conclusion     Prox RCA lesion is 20% stenosed. Prox Cx to Mid Cx lesion is 20% stenosed. Mid LAD lesion is 30% stenosed.   Mild non-obstructive CAD Normal right and left heart pressures   Recommendations: Medical management of mild CAD. Will resume IV heparin 6 hours post sheath pull given acute PE. Would transition to Eliquis or Xarelto tomorrow.   Echo 05/26/21 IMPRESSIONS     1. Left ventricular ejection fraction, by estimation, is 45 to 50%. The  left ventricle has mildly decreased function. The left ventricle has no  regional wall motion abnormalities. There is severe asymmetric left  ventricular hypertrophy. Left ventricular   diastolic parameters are consistent with Grade I diastolic dysfunction  (impaired relaxation).   2. Right ventricular systolic function is moderately reduced. The right  ventricular size is moderately enlarged. There is moderately elevated  pulmonary artery systolic pressure.   3. Left atrial size was moderately dilated.   4. Right atrial size was mildly dilated.   5. The mitral valve is normal in structure. Mild mitral valve  regurgitation.   6. The aortic valve is normal in structure. Aortic valve regurgitation is  not visualized. No aortic stenosis is present.   7. Aortic dilatation noted. There is mild dilatation of the ascending  aorta, measuring 41 mm.    CT 06/07/21 IMPRESSION: 1. Bland appearing, bandlike scarring and or partial atelectasis of the dependent lungs, unchanged compared to  prior examination. No evidence of fibrotic interstitial lung disease. No significant air trapping on expiratory phase imaging. 2. Cardiomegaly and coronary artery disease. 3. Unchanged enlargement of the tubular ascending thoracic aorta, measuring up to 4.4 x 4.4 cm. Recommend annual imaging followup by CTA or MRA if not otherwise imaged. This recommendation follows 2010 ACCF/AHA/AATS/ACR/ASA/SCA/SCAI/SIR/STS/SVM Guidelines for the Diagnosis and Management of Patients with Thoracic Aortic Disease. Circulation. 2010; 121: Z610-R604. Aortic aneurysm NOS (ICD10-I71.9)   Aortic Atherosclerosis (ICD10-I70.0).     Electronically Signed   By: Lauralyn Primes M.D.   On: 06/07/2021 15:12  cMRI 07/2021 IMPRESSION: 1. Asymmetric LV hypertrophy measuring 18mm in basal septum (10mm in basal lateral wall). This meets criteria for hypertrophic cardiomyopathy   2. Patchy late gadolinium enhancement at RV insertion site and basal lateral wall. LGE accounts for 1% of total myocardial mass. While this LGE pattern is consistent with HCM, would also consider Fabry disease as it can present with basal lateral LGE and LVH. Would recommend checking alpha-galactosidase A level   3. Normal LV size and systolic function (EF 52%), though was difficult to quantify volumes/EF due to significant motion artifact during acquisition   4.  Normal RV size with mild systolic dysfunction (EF 45%)   5.  Small pericardial effusion   6.  Dilated ascending  aorta measuring 43mm  Risk Assessment/Calculations:         ASSESSMENT:    1. PFO (patent foramen ovale)   2. HOCM (hypertrophic obstructive cardiomyopathy) (HCC)   3. Coronary artery disease involving native coronary artery of native heart without angina pectoris   4. DOE (dyspnea on exertion)   5. Essential hypertension   6. Leg edema   7. Ventricular tachycardia (HCC)   8. Other acute pulmonary embolism, unspecified whether acute cor pulmonale present  (HCC)      PLAN:   PFO -noted to be small on recent echo with L?R shunting -doubt this is the cause of his SOB and ongoing hypoxemia but Pulmonary has no other etiology for persistent hypoxemia -will set up TEE to further evaluate -Shared Decision Making/Informed Consent The risks [esophageal damage, perforation (1:10,000 risk), bleeding, pharyngeal hematoma as well as other potential complications associated with conscious sedation including aspiration, arrhythmia, respiratory failure and death], benefits (treatment guidance and diagnostic support) and alternatives of a transesophageal echocardiogram were discussed in detail with Mr. Marrow and he is willing to proceed.   HOCM -noted on recent cMRI done for workup fo VT -s/p AICD implant by Dr. Laurina Bustle -minimal on cath, R/L heart pressures normal.  -DOE out of proportion to CAD.   -continue statin and BB -no ASA due to DOAC  Dyspnea on exertion/COPD  -placed on home O2 by pulmonary for O2 sats in the 80s with walking.   -workup in progress for ongoing hypoxemia  HTN  -Bp borderline controlled on exam today -continue Losartan  daily, Chlorthalidone  daily, carvedilol 6.25mg  BID, spiro 12.5mg  daily and Verapamil  daily -increase Hydralazine to  TID -Bp check daily for a week and call with results  Lower extremity edema  -suspect secondary to high salt diet.   -Does have grade 1 DD on echo but had normal filling pressure on cath.   -Takes Lasix as needed.   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   -s/p AICD secondary to VT and dx of HOCM on cMRI -continue Verapamil  daily and Carvedilol 6.25mg  BID  Acute PE -followed by Pulmonary -small and not felt to be cause of persistent hypoxemia -continue DOAC  Thoracic aortic aneurysm  -4.4 cm on CT angio 06/06/2021 -followed with yearly CTA  Shared Decision Making/Informed Consent         Medication Adjustments/Labs and Tests Ordered: Current medicines are reviewed at length with the patient today.  Concerns regarding medicines are outlined above.  Medication changes, Labs and Tests ordered today are listed in the Patient Instructions below. Patient Instructions  Medication Instructions:  Your physician recommends that you continue on your current medications as directed. Please refer to the Current Medication list given to you today.  *If you need a refill on your cardiac medications before your next appointment, please call your pharmacy*   Lab Work: TODAY: BMET and CBC If you have labs (blood work) drawn today and your tests are completely normal, you will receive your results only by: MyChart Message (if you have MyChart) OR A paper copy in the mail If you have any lab test that is abnormal or we need to change your treatment, we will call you to review the results.   Testing/Procedures: Your physician has requested that you have a TEE. During a TEE, sound waves are used to create images of your heart. It provides your doctor with information about  the size and shape of your heart and how well your heart's chambers and valves are working. In this test, a transducer is attached to the end of a flexible tube that's guided down your throat and into your esophagus (the tube leading from you mouth to your stomach) to get a more detailed image of your heart. You are not awake for the procedure. Please see the instruction sheet given to you today. For further information please visit [email or web address]   Follow-Up: At [facility], you and your health needs are our priority.  As part of our continuing mission to provide you with exceptional heart care, we have created designated Provider Care Teams.  These Care Teams include your primary Cardiologist (physician) and Advanced Practice Providers (APPs -  Physician Assistants and Nurse Practitioners) who all work  together to provide you with the care you need, when you need it.    Other Instructions You are scheduled for a TEE on Friday, September 9th with Dr. Eden Emms.  Please arrive at the [facility] (Main Entrance A) at [facility]: [address] [place], [place] [address] at 11:30am am. (1 hour prior to procedure unless lab work is needed; if lab work is needed arrive 1.5 hours ahead)  DIET: Nothing to eat or drink after midnight except a sip of water with medications (see medication instructions below)  FYI: For your safety, and to allow [place] to monitor your vital signs accurately during the surgery/procedure we request that   if you have artificial nails, gel coating, SNS etc. Please have those removed prior to your surgery/procedure. Not having the nail coverings /polish removed may result in cancellation or delay of your surgery/procedure.   Medication Instructions: Hold chlorhtalidone, furosemide, and spironolactone the morning of your procedure  Continue your anticoagulant: Eliquis You will need to continue your anticoagulant after your procedure until you  are told by your  Provider that it is safe to stop   Labs: TODAY  You must have a responsible person to drive you home and stay in the waiting area during your procedure. Failure to do so could result in cancellation.  Bring your insurance cards.  *Special Note: Every effort is made to have your procedure done on time. Occasionally there are emergencies that occur at the hospital that may cause delays. Please be patient if a delay does occur.     Signed, [name], MD  08/15/2021 11:00 AM    [facility] [address] [place], [place], [place]  [address] Phone: [phone]; Fax: [phone]

## 2021-08-15 NOTE — Patient Instructions (Signed)
Medication Instructions:  Your physician recommends that you continue on your current medications as directed. Please refer to the Current Medication list given to you today.  *If you need a refill on your cardiac medications before your next appointment, please call your pharmacy*   Lab Work: TODAY: BMET and CBC If you have labs (blood work) drawn today and your tests are completely normal, you will receive your results only by: MyChart Message (if you have MyChart) OR A paper copy in the mail If you have any lab test that is abnormal or we need to change your treatment, we will call you to review the results.   Testing/Procedures: Your physician has requested that you have a TEE. During a TEE, sound waves are used to create images of your heart. It provides your doctor with information about the size and shape of your heart and how well your heart's chambers and valves are working. In this test, a transducer is attached to the end of a flexible tube that's guided down your throat and into your esophagus (the tube leading from you mouth to your stomach) to get a more detailed image of your heart. You are not awake for the procedure. Please see the instruction sheet given to you today. For further information please visit [email or web address]   Follow-Up: At [facility], you and your health needs are our priority.  As part of our continuing mission to provide you with exceptional heart care, we have created designated Provider Care Teams.  These Care Teams include your primary Cardiologist (physician) and Advanced Practice Providers (APPs -  Physician Assistants and Nurse Practitioners) who all work together to provide you with the care you need, when you need it.    Other Instructions You are scheduled for a TEE on Friday, September 9th with Dr. Eden Emms.  Please arrive at the [facility] (Main Entrance A) at [facility]: [address] [place], [place] [address] at 11:30am am. (1  hour prior to procedure unless lab work is needed; if lab work is needed arrive 1.5 hours ahead)  DIET: Nothing to eat or drink after midnight except a sip of water with medications (see medication instructions below)  FYI: For your safety, and to allow [place] to monitor your vital signs accurately during the surgery/procedure we request that   if you have artificial nails, gel coating, SNS etc. Please have those removed prior to your surgery/procedure. Not having the nail coverings /polish removed may result in cancellation or delay of your surgery/procedure.   Medication Instructions: Hold chlorhtalidone, furosemide, and spironolactone the morning of your procedure  Continue your anticoagulant: Eliquis You will need to continue your anticoagulant after your procedure until you  are told by your  Provider that it is safe to stop   Labs: TODAY  You must have a responsible person to drive you home and stay in the waiting area during your procedure. Failure to do so could result in cancellation.  Bring your insurance cards.  *Special Note: Every effort is made to have your procedure done on time. Occasionally there are emergencies that occur at the hospital that may cause delays. Please be patient if a delay does occur.

## 2021-08-16 ENCOUNTER — Ambulatory Visit: Payer: No Typology Code available for payment source | Admitting: Student

## 2021-08-16 ENCOUNTER — Telehealth: Payer: Self-pay | Admitting: Pulmonary Disease

## 2021-08-16 NOTE — Telephone Encounter (Signed)
I have called and LM on VM for Whitney to call us back about this pt and message.

## 2021-08-16 NOTE — Telephone Encounter (Signed)
Called and spoke with Whitney from [employer] which is the [employer] and she calling because the patient is currently using [employer] for his DME and needs to switch to one that is in network. Whitney states patient needs to change oxygen companies to [employer], [employer], [employer], Programme researcher, broadcasting/film/video, and Camera operator.   Will route to [facility] pool

## 2021-08-17 ENCOUNTER — Other Ambulatory Visit: Payer: Self-pay | Admitting: Cardiology

## 2021-08-17 DIAGNOSIS — Q2112 Patent foramen ovale: Secondary | ICD-10-CM

## 2021-08-17 DIAGNOSIS — Q211 Atrial septal defect: Secondary | ICD-10-CM

## 2021-08-18 ENCOUNTER — Encounter (HOSPITAL_COMMUNITY): Payer: Self-pay | Admitting: Cardiovascular Disease

## 2021-08-18 ENCOUNTER — Ambulatory Visit (INDEPENDENT_AMBULATORY_CARE_PROVIDER_SITE_OTHER): Payer: No Typology Code available for payment source | Admitting: Student

## 2021-08-18 ENCOUNTER — Other Ambulatory Visit: Payer: Self-pay

## 2021-08-18 ENCOUNTER — Ambulatory Visit (HOSPITAL_BASED_OUTPATIENT_CLINIC_OR_DEPARTMENT_OTHER): Payer: No Typology Code available for payment source

## 2021-08-18 ENCOUNTER — Ambulatory Visit (HOSPITAL_COMMUNITY): Payer: No Typology Code available for payment source | Admitting: Certified Registered"

## 2021-08-18 ENCOUNTER — Encounter (HOSPITAL_COMMUNITY)
Admission: RE | Disposition: A | Discharge status: Home / Self Care | Payer: Self-pay | Source: Home / Self Care | Attending: Cardiovascular Disease

## 2021-08-18 ENCOUNTER — Ambulatory Visit (HOSPITAL_COMMUNITY)
Admission: RE | Admit: 2021-08-18 | Discharge: 2021-08-18 | Disposition: A | Discharge status: Home / Self Care | Payer: No Typology Code available for payment source | Attending: Cardiovascular Disease | Admitting: Cardiovascular Disease

## 2021-08-18 DIAGNOSIS — Z8616 Personal history of COVID-19: Secondary | ICD-10-CM | POA: Diagnosis not present

## 2021-08-18 DIAGNOSIS — N182 Chronic kidney disease, stage 2 (mild): Secondary | ICD-10-CM | POA: Diagnosis not present

## 2021-08-18 DIAGNOSIS — Z8249 Family history of ischemic heart disease and other diseases of the circulatory system: Secondary | ICD-10-CM | POA: Diagnosis not present

## 2021-08-18 DIAGNOSIS — I313 Pericardial effusion (noninflammatory): Secondary | ICD-10-CM | POA: Diagnosis not present

## 2021-08-18 DIAGNOSIS — I130 Hypertensive heart and chronic kidney disease with heart failure and stage 1 through stage 4 chronic kidney disease, or unspecified chronic kidney disease: Secondary | ICD-10-CM | POA: Insufficient documentation

## 2021-08-18 DIAGNOSIS — I472 Ventricular tachycardia, unspecified: Secondary | ICD-10-CM

## 2021-08-18 DIAGNOSIS — Q211 Atrial septal defect: Secondary | ICD-10-CM | POA: Insufficient documentation

## 2021-08-18 DIAGNOSIS — I351 Nonrheumatic aortic (valve) insufficiency: Secondary | ICD-10-CM | POA: Diagnosis not present

## 2021-08-18 DIAGNOSIS — Z95810 Presence of automatic (implantable) cardiac defibrillator: Secondary | ICD-10-CM | POA: Diagnosis not present

## 2021-08-18 DIAGNOSIS — I421 Obstructive hypertrophic cardiomyopathy: Secondary | ICD-10-CM | POA: Insufficient documentation

## 2021-08-18 DIAGNOSIS — I2510 Atherosclerotic heart disease of native coronary artery without angina pectoris: Secondary | ICD-10-CM | POA: Insufficient documentation

## 2021-08-18 DIAGNOSIS — I712 Thoracic aortic aneurysm, without rupture: Secondary | ICD-10-CM | POA: Diagnosis not present

## 2021-08-18 DIAGNOSIS — I509 Heart failure, unspecified: Secondary | ICD-10-CM | POA: Diagnosis not present

## 2021-08-18 DIAGNOSIS — R0609 Other forms of dyspnea: Secondary | ICD-10-CM | POA: Insufficient documentation

## 2021-08-18 DIAGNOSIS — I340 Nonrheumatic mitral (valve) insufficiency: Secondary | ICD-10-CM

## 2021-08-18 DIAGNOSIS — J449 Chronic obstructive pulmonary disease, unspecified: Secondary | ICD-10-CM | POA: Diagnosis not present

## 2021-08-18 DIAGNOSIS — Z7901 Long term (current) use of anticoagulants: Secondary | ICD-10-CM | POA: Diagnosis not present

## 2021-08-18 DIAGNOSIS — Z7951 Long term (current) use of inhaled steroids: Secondary | ICD-10-CM | POA: Insufficient documentation

## 2021-08-18 DIAGNOSIS — Z79899 Other long term (current) drug therapy: Secondary | ICD-10-CM | POA: Diagnosis not present

## 2021-08-18 DIAGNOSIS — Q2112 Patent foramen ovale: Secondary | ICD-10-CM

## 2021-08-18 DIAGNOSIS — Z886 Allergy status to analgesic agent status: Secondary | ICD-10-CM | POA: Insufficient documentation

## 2021-08-18 DIAGNOSIS — R609 Edema, unspecified: Secondary | ICD-10-CM | POA: Insufficient documentation

## 2021-08-18 DIAGNOSIS — I2699 Other pulmonary embolism without acute cor pulmonale: Secondary | ICD-10-CM | POA: Insufficient documentation

## 2021-08-18 HISTORY — PX: TEE WITHOUT CARDIOVERSION: SHX5443

## 2021-08-18 HISTORY — PX: BUBBLE STUDY: SHX6837

## 2021-08-18 LAB — CUP PACEART INCLINIC DEVICE CHECK
Battery Remaining Longevity: 90 mo
Brady Statistic RA Percent Paced: 28 %
Brady Statistic RV Percent Paced: 16 %
Date Time Interrogation Session: 20220909105716
HighPow Impedance: 57.375
Implantable Lead Implant Date: 20220829
Implantable Lead Implant Date: 20220829
Implantable Lead Location: 753859
Implantable Lead Location: 753860
Implantable Pulse Generator Implant Date: 20220829
Lead Channel Impedance Value: 400 Ohm
Lead Channel Impedance Value: 437.5 Ohm
Lead Channel Pacing Threshold Amplitude: 0.5 V
Lead Channel Pacing Threshold Amplitude: 0.5 V
Lead Channel Pacing Threshold Amplitude: 0.75 V
Lead Channel Pacing Threshold Pulse Width: 0.5 ms
Lead Channel Pacing Threshold Pulse Width: 0.5 ms
Lead Channel Pacing Threshold Pulse Width: 0.5 ms
Lead Channel Sensing Intrinsic Amplitude: 3.6 mV
Lead Channel Sensing Intrinsic Amplitude: 8.9 mV
Lead Channel Setting Pacing Amplitude: 1 V
Lead Channel Setting Pacing Amplitude: 3.5 V
Lead Channel Setting Pacing Pulse Width: 0.5 ms
Lead Channel Setting Sensing Sensitivity: 0.5 mV
Pulse Gen Serial Number: 810029878

## 2021-08-18 SURGERY — ECHOCARDIOGRAM, TRANSESOPHAGEAL
Anesthesia: Monitor Anesthesia Care

## 2021-08-18 MED ORDER — SODIUM CHLORIDE 0.9 % IV SOLN: INTRAVENOUS | Status: DC

## 2021-08-18 MED ORDER — PROPOFOL 500 MG/50ML IV EMUL
INTRAVENOUS | Status: DC | PRN
  Administered 2021-08-18: 200 ug/kg/min via INTRAVENOUS

## 2021-08-18 NOTE — Progress Notes (Addendum)
  Echocardiogram TEE has been performed.  Roosvelt Maser F 08/18/2021, 1:26 PM

## 2021-08-18 NOTE — CV Procedure (Signed)
TEE: Anesthesia:  Propofol   EF 60% Redundant and mobile atrial septum with moderate PFO Positive bubble with right to left shunt especially with valsalva and abdominal pressure Mild TR Normal left sided PV;s and RUPV unable to visualize RLPV Normal AV/MV Dilated aortic root and ascending thoracic aorta AICD wires in RA/RV Small pericardial effusion   Will have Dr Excell Seltzer review  Charlton Haws MD [facility]

## 2021-08-18 NOTE — Discharge Instructions (Signed)

## 2021-08-18 NOTE — Interval H&P Note (Signed)
History and Physical Interval Note:  08/18/2021 11:50 AM  Ryan Glass  has presented today for surgery, with the diagnosis of PFO.  The various methods of treatment have been discussed with the patient and family. After consideration of risks, benefits and other options for treatment, the patient has consented to  Procedure(s): TRANSESOPHAGEAL ECHOCARDIOGRAM (TEE) (N/A) as a surgical intervention.  The patient's history has been reviewed, patient examined, no change in status, stable for surgery.  I have reviewed the patient's chart and labs.  Questions were answered to the patient's satisfaction.     Charlton Haws

## 2021-08-18 NOTE — Telephone Encounter (Signed)
This will require a new order & pt will need to be requalified for O2.

## 2021-08-18 NOTE — Transfer of Care (Signed)
Immediate Anesthesia Transfer of Care Note  Patient: Ryan Glass  Procedure(s) Performed: TRANSESOPHAGEAL ECHOCARDIOGRAM (TEE) BUBBLE STUDY  Patient Location: Endoscopy Unit  Anesthesia Type:MAC  Level of Consciousness: sedated, patient cooperative and responds to stimulation  Airway & Oxygen Therapy: Patient Spontanous Breathing and Patient connected to nasal cannula oxygen  Post-op Assessment: Report given to RN and Post -op Vital signs reviewed and stable  Post vital signs: Reviewed and stable  Last Vitals:  Vitals Value Taken Time  BP 118/78 08/18/21 1323  Temp    Pulse 57 08/18/21 1324  Resp 18 08/18/21 1324  SpO2 93 % 08/18/21 1324  Vitals shown include unvalidated device data.  Last Pain:  Vitals:   08/18/21 1148  TempSrc: Temporal  PainSc: 0-No pain         Complications: No notable events documented.

## 2021-08-18 NOTE — Anesthesia Preprocedure Evaluation (Signed)
Anesthesia Evaluation  Patient identified by MRN, date of birth, ID band Patient awake    Reviewed: Allergy & Precautions, NPO status , Patient's Chart, lab work & pertinent test results  Airway Mallampati: II  TM Distance: >3 FB Neck ROM: Full    Dental  (+) Edentulous Upper   Pulmonary sleep apnea and Continuous Positive Airway Pressure Ventilation , PE   Pulmonary exam normal breath sounds clear to auscultation       Cardiovascular hypertension, Pt. on medications and Pt. on home beta blockers + CAD and +CHF  Normal cardiovascular exam+ Cardiac Defibrillator  Rhythm:Regular Rate:Normal     Neuro/Psych negative neurological ROS     GI/Hepatic negative GI ROS, Neg liver ROS,   Endo/Other  negative endocrine ROS  Renal/GU Renal disease     Musculoskeletal  (+) Arthritis ,   Abdominal (+) + obese,   Peds  Hematology HLD   Anesthesia Other Findings PFO  Reproductive/Obstetrics                             Anesthesia Physical Anesthesia Plan  ASA: 3  Anesthesia Plan: MAC   Post-op Pain Management:    Induction: Intravenous  PONV Risk Score and Plan: 1 and Propofol infusion and Treatment may vary due to age or medical condition  Airway Management Planned: Nasal Cannula  Additional Equipment:   Intra-op Plan:   Post-operative Plan:   Informed Consent: I have reviewed the patients History and Physical, chart, labs and discussed the procedure including the risks, benefits and alternatives for the proposed anesthesia with the patient or authorized representative who has indicated his/her understanding and acceptance.     Dental advisory given  Plan Discussed with: CRNA  Anesthesia Plan Comments:         Anesthesia Quick Evaluation

## 2021-08-18 NOTE — Telephone Encounter (Signed)
Called and spoke with pt letting him know that we could switch DMEs but he would need to have a requalifying walk performed again and a new order placed due to switching DMEs.  Stated to pt that we could get this taken care of at his upcoming OV with Dr. Francine Graven and he verbalized understanding. Note has been placed in pt's upcoming appt stating this. Nothing further needed.

## 2021-08-18 NOTE — Progress Notes (Signed)
Wound check appointment. Steri-strips removed. Wound without redness or edema. Incision edges approximated, wound well healed. Normal device function. Thresholds, sensing, and impedances consistent with implant measurements. Device programmed at 3.5V for extra safety margin until 3 month visit. Histogram distribution appropriate for patient and level of activity. 1 episode of AF ~ 1 day long. On Eliquis. No ventricular arrhythmias noted. Patient educated about wound care, arm mobility, lifting restrictions, shock plan. ROV in 3 months with Dr. Ladona Ridgel.

## 2021-08-19 ENCOUNTER — Encounter (HOSPITAL_COMMUNITY): Payer: Self-pay | Admitting: Cardiovascular Disease

## 2021-08-19 NOTE — Anesthesia Postprocedure Evaluation (Signed)
Anesthesia Post Note  Patient: Ryan Glass  Procedure(s) Performed: TRANSESOPHAGEAL ECHOCARDIOGRAM (TEE) BUBBLE STUDY     Patient location during evaluation: Endoscopy Anesthesia Type: MAC Level of consciousness: awake Pain management: pain level controlled Vital Signs Assessment: post-procedure vital signs reviewed and stable Respiratory status: spontaneous breathing, nonlabored ventilation, respiratory function stable and patient connected to nasal cannula oxygen Cardiovascular status: stable and blood pressure returned to baseline Postop Assessment: no apparent nausea or vomiting Anesthetic complications: no   No notable events documented.  Last Vitals:  Vitals:   08/18/21 1333 08/18/21 1342  BP: 136/90 137/81  Pulse: (!) 59 (!) 59  Resp: 14 16  Temp:    SpO2: 97% 97%    Last Pain:  Vitals:   08/18/21 1342  TempSrc:   PainSc: 0-No pain                 Destanie Tibbetts P Anelis Hrivnak

## 2021-08-20 ENCOUNTER — Telehealth: Payer: Self-pay | Admitting: Registered Nurse

## 2021-08-21 ENCOUNTER — Telehealth: Payer: Self-pay

## 2021-08-21 NOTE — Telephone Encounter (Signed)
Per Dr. Mayford Knife, scheduled patient for PFO consult with Dr. Excell Seltzer 08/24/2021. He was grateful for call and agrees with plan.

## 2021-08-21 NOTE — Telephone Encounter (Signed)
-----   Message from Tonny Bollman, MD sent at 08/21/2021  8:46 AM EDT ----- These cases can be really hard to sort out. Shunt runs don't help because it's R--->L instead of L--->R shunt. I'm happy to see him to discuss closure. Suspect the most prudent thing to do if he doesn't have a pulmonary explanation for hypoxemia is to close the PFO and see if his O2 sat improves.  ----- Message ----- From: Quintella Reichert, MD Sent: 08/20/2021  10:29 PM EDT To: Wendall Stade, MD, Julio Sicks, NP, #  Kathlene November any thoughts? ----- Message ----- From: Wendall Stade, MD Sent: 08/18/2021   2:20 PM EDT To: Julio Sicks, NP, Quintella Reichert, MD, #  Did patients TEE today for "unexplained hypoxia/dyspnea"  he has an atrial septal aneurysm with moderate PFO Consider f/u with Excell Seltzer to consider closing Also wonder if he should have platypnea orthodeoxia r/o with bubble study sitting/standing in office Could not do under anesthesia

## 2021-08-24 ENCOUNTER — Other Ambulatory Visit: Payer: Self-pay | Admitting: *Deleted

## 2021-08-24 ENCOUNTER — Encounter: Payer: Self-pay | Admitting: Cardiovascular Disease

## 2021-08-24 ENCOUNTER — Other Ambulatory Visit: Payer: Self-pay

## 2021-08-24 ENCOUNTER — Ambulatory Visit (INDEPENDENT_AMBULATORY_CARE_PROVIDER_SITE_OTHER): Payer: No Typology Code available for payment source | Admitting: Cardiovascular Disease

## 2021-08-24 VITALS — BP 138/78 | HR 65 | Ht 70.0 in | Wt 251.8 lb

## 2021-08-24 DIAGNOSIS — Q2112 Patent foramen ovale: Secondary | ICD-10-CM

## 2021-08-24 DIAGNOSIS — Q211 Atrial septal defect: Secondary | ICD-10-CM

## 2021-08-24 MED ORDER — ASPIRIN EC 81 MG PO TBEC: 81.0000 mg | DELAYED_RELEASE_TABLET | Freq: Every day | ORAL | 3 refills | Status: DC

## 2021-08-24 NOTE — Telephone Encounter (Addendum)
Reviewed Epic notes patient saw Dr Tonny Bollman cardiology today and noted POF closure scheduled for 09/06/21 per Epic.  HR notified surgery scheduled 09/06/21

## 2021-08-24 NOTE — Progress Notes (Signed)
Cardiology Office Note:    Date:  08/28/2021   ID:  Ryan Glass, DOB [birth year], MRN [id number]  PCP:  [name], MD   [facility] Providers Cardiologist:  [name], MD Electrophysiologist:  [name], MD     Referring MD: [name], MD   Chief Complaint  Patient presents with   Shortness of Breath    History of Present Illness:    Ryan Glass is a 65 y.o. male with a hx of multiple medical problems, referred for consideration of PFO closure for treatment of hypoxemia and concern of right to left shunting as an etiology.  The patient has a fairly complex medical history.  He has rheumatoid arthritis on chronic prednisone, obstructive sleep apnea, mild stage II chronic kidney disease, PVCs status post PVC ablation ultimately undergoing ICD implantation, thoracic aortic aneurysm, and hypoxemia now on home O2.  Since June 2022 and the patient was found to have pulmonary embolism, he has had significant shortness of breath.  His shortness of breath is felt to be out of proportion to what was considered a small pulmonary embolus.  He ultimately has required home oxygen because of exertional dyspnea and oxygen desaturation with walking.  O2 sats have been in the 80s with ambulation.  The patient has undergone multiple recent studies outlined below.  These have included a cardiac MRI which was suggestive of hypertrophic cardiomyopathy with asymmetric LVH and patchy late gadolinium enhancement.  A CAT scan of the chest showed bandlike scarring and/or partial atelectasis of the dependent lungs but no evidence of interstitial lung disease or significant air trapping.  The ascending aorta is dilated up to a maximum diameter of 4.4 cm.  Cardiac catheterization in June 2022 showed no obstructive CAD.  TEE demonstrated normal LVEF of 55 to 60%, positive shunting through a PFO with positive bubble study and color-flow Doppler demonstrating bidirectional shunt, small pericardial  effusion, and no significant valvular disease.  The patient is here with his wife today. He continues to struggle with dyspnea with low level activity. No edema, orthopnea, PND, or chest pain. He has occasional heart palpitations.   Past Medical History:  Diagnosis Date   Ascending aortic aneurysm (HCC)    a. CT 05/2021 showed 4.4x4.4cm TAA.   Bilateral lower extremity edema    Chronic gout without tophus    followed by dr Sharmon Revere    (06-08-2020  per pt last episode right knee 3 wks ago)   CKD (chronic kidney disease), stage II    nephrology--- [facility] (10-29-2019 note in epic scanned in  media)   Heart failure with mid-range ejection fraction [facility]) 05/27/2021   05/26/21: Left ventricular ejection fraction, by estimation, is 45 to 50%. There is severe asymmetric left ventricular hypertrophy. G1DD present.    History of COVID-19 06/07/2021   HOCM (hypertrophic obstructive cardiomyopathy) (HCC)    s/p ICD 07/2021   Hypertension    followed by cardiology, dr t. turner   (05-14-2018 nuclear study in epic , normal perfusion with nuclear ef 61%)   Left hydrocele    Low serum potassium level 04/27/2014   OSA on CPAP    per pt uses every night   Palpitations followed by dr t. Mayford Knife   06-08-2020  still feels palipations due to PVCs when exertion but not with chest pain/ discomfort   Pneumonia due to COVID-19 virus    PVC's (premature ventricular contractions) cardiologist--- dr t. turner   Status post PVC ablation  by Dr. Ladona Ridgel 2019 with recurrence of frequent PVCs/bigeminy;  prior pseudobradycardia r/t pvcs   Rheumatoid arthritis involving multiple sites [facility])    rheumotology--- dr a. Sharmon Revere  ([facility] in [place])    Past Surgical History:  Procedure Laterality Date   BUBBLE STUDY  08/18/2021   Procedure: BUBBLE STUDY;  Surgeon: Wendall Stade, MD;  Location: [facility] ENDOSCOPY;  Service: Cardiovascular;;   HYDROCELE EXCISION Left 06/14/2020   Procedure: LEFT  HYDROCELECTOMY ADULT;   Surgeon: Noel Christmas, MD;  Location: [facility];  Service: Urology;  Laterality: Left;   ICD IMPLANT N/A 08/07/2021   Procedure: ICD IMPLANT;  Surgeon: Marinus Maw, MD;  Location: [facility] INVASIVE CV LAB;  Service: Cardiovascular;  Laterality: N/A;   INCISIONAL HERNIA REPAIR  02-23-2016   @[facility]    LAPAROSCOPIC   LAPAROSCOPIC INGUINAL HERNIA REPAIR Bilateral 08-22-2015  @[facility]    AND UMBILICAL HERNIA REPAIR   PVC ABLATION N/A 10/07/2018   Procedure: PVC ABLATION;  Surgeon: , MD;  Location: [facility] INVASIVE CV LAB;  Service: Cardiovascular;  Laterality: N/A;   RIGHT/LEFT HEART CATH AND CORONARY ANGIOGRAPHY N/A 05/29/2021   Procedure: RIGHT/LEFT HEART CATH AND CORONARY ANGIOGRAPHY;  Surgeon: Marinus Maw, MD;  Location: [facility] INVASIVE CV LAB;  Service: Cardiovascular;  Laterality: N/A;   TEE WITHOUT CARDIOVERSION N/A 08/18/2021   Procedure: TRANSESOPHAGEAL ECHOCARDIOGRAM (TEE);  Surgeon: Kathleene Hazel, MD;  Location: [facility] ENDOSCOPY;  Service: Cardiovascular;  Laterality: N/A;   UMBILICAL HERNIA REPAIR  child    Current Medications: Current Meds  Medication Sig   acetaminophen-codeine (TYLENOL #3) 300-30 MG tablet Take 1-2 tablets by mouth as needed for moderate pain.   albuterol (VENTOLIN HFA) 108 (90 Base) MCG/ACT inhaler Inhale 2 puffs into the lungs every 4 (four) hours as needed for wheezing or shortness of breath.   apixaban (ELIQUIS) 5 MG TABS tablet Take 1 tablet (5 mg total) by mouth 2 (two) times daily.   aspirin EC 81 MG tablet Take 1 tablet (81 mg total) by mouth daily. Swallow whole.   atorvastatin (LIPITOR) 40 MG tablet Take 1 tablet (40 mg total) by mouth daily.   carvedilol (COREG) 6.25 MG tablet Take 1 tablet (6.25 mg total) by mouth 2 (two) times daily with a meal.   chlorthalidone (HYGROTON) 25 MG tablet Take 25 mg by mouth daily.   colchicine 0.6 MG tablet Take 1-2 tablets by mouth daily as needed (gout).    fluticasone (FLONASE) 50 MCG/ACT  nasal spray Place 1 spray into both nostrils daily.   Fluticasone-Salmeterol (ADVAIR) 250-50 MCG/DOSE AEPB Inhale 1 puff into the lungs every 12 (twelve) hours.   folic acid (FOLVITE) 1 MG tablet Take 1 mg by mouth daily.   furosemide (LASIX) 40 MG tablet Take 1 tablet (40 mg total) by mouth as needed. Take 1 tablet by mouth daily for 3 days, then decrease to only as needed for increase weight gain   gabapentin (NEURONTIN) 600 MG tablet Take 600 mg by mouth 3 (three) times daily.    HUMIRA PEN 40 MG/0.4ML PNKT Inject 40 mg as directed every 14 (fourteen) days.   hydrALAZINE (APRESOLINE) 50 MG tablet Take 1 tablet (50 mg total) by mouth 3 (three) times daily.   losartan (COZAAR) 100 MG tablet Take 1 tablet (100 mg total) by mouth daily.   methotrexate 2.5 MG tablet Take 15 mg by mouth every Sunday.   montelukast (SINGULAIR) 10 MG tablet Take 1 tablet (10 mg total) by mouth at  bedtime.   Potassium Chloride ER 20 MEQ TBCR Take 20 mEq by mouth in the morning and at bedtime.   predniSONE (DELTASONE) 5 MG tablet Take 5 mg by mouth daily.   sildenafil (REVATIO) 20 MG tablet Take 20 mg by mouth daily as needed (erectile dysfunction).   spironolactone (ALDACTONE) 25 MG tablet Take 0.5 tablets (12.5 mg total) by mouth daily.   verapamil (CALAN-SR) 180 MG CR tablet Take 1 tablet (180 mg total) by mouth daily.     Allergies:   Amlodipine besylate and Aspirin   Social History   Socioeconomic History   Marital status: Married    Spouse name: Not on file   Number of children: Not on file   Years of education: Not on file   Highest education level: Not on file  Occupational History   Not on file  Tobacco Use   Smoking status: Never   Smokeless tobacco: Never  Vaping Use   Vaping Use: Never used  Substance and Sexual Activity   Alcohol use: Yes    Alcohol/week: 5.0 - 7.0 standard drinks    Types: 5 - 7 Cans of beer per week   Drug use: Never   Sexual activity: Not on file  Other Topics Concern    Not on file  Social History Narrative   Not on file   Social Determinants of Health   Financial Resource Strain: Not on file  Food Insecurity: Not on file  Transportation Needs: Not on file  Physical Activity: Not on file  Stress: Not on file  Social Connections: Not on file     Family History: The patient's family history includes Cardiomyopathy in his mother; Heart attack in his father.  ROS:   Please see the history of present illness.    All other systems reviewed and are negative.  EKGs/Labs/Other Studies Reviewed:    The following studies were reviewed today: Cardiac Cath: Conclusion    Prox RCA lesion is 20% stenosed. Prox Cx to Mid Cx lesion is 20% stenosed. Mid LAD lesion is 30% stenosed.   Mild non-obstructive CAD Normal right and left heart pressures   Recommendations: Medical management of mild CAD. Will resume IV heparin 6 hours post sheath pull given acute PE. Would transition to Eliquis or Xarelto tomorrow.   TEE: 1. Will have Dr Excell Seltzer review regarding closure of PFO in regard to  patients hypoxemai.   2. Left ventricular ejection fraction, by estimation, is 55 to 60%. The  left ventricle has normal function. There is mild left ventricular  hypertrophy. Left ventricular diastolic function could not be evaluated.   3. AICD wires in RV/RA. Right ventricular systolic function is moderately  reduced. The right ventricular size is moderately enlarged.   4. R/L upper pulmonary veins and LLPV normal Unable to visualize RLPV.  Left atrial size was mildly dilated. No left atrial/left atrial appendage  thrombus was detected.   5. Evidence of atrial level shunting detected by color flow Doppler.  Agitated saline contrast bubble study was positive with shunting observed  within 3-6 cardiac cycles suggestive of interatrial shunt.   6. Right atrial size was mildly dilated.   7. A small pericardial effusion is present. The pericardial effusion is  anterior to  the right ventricle.   8. The mitral valve is grossly normal. Mild mitral valve regurgitation.   9. The aortic valve is tricuspid. Aortic valve regurgitation is mild.  10. Aortic dilatation noted. 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 There is mild dilatation of the ascending aorta,  measuring 41 mm.    Recent Labs: 02/06/2021: NT-Pro BNP 47 06/06/2021: TSH 0.975 08/02/2021: ALT 26; B Natriuretic Peptide 50.5; Magnesium 2.0 08/15/2021: BUN 18; Creatinine, Ser 1.19; Hemoglobin 13.8; Platelets 193; Potassium 4.2; Sodium 138  Recent Lipid Panel    Component Value Date/Time   CHOL 123 05/27/2021 0138   CHOL 136 02/20/2021 1215   TRIG 67 05/27/2021 0138   HDL 49 05/27/2021 0138   HDL 64 02/20/2021 1215   CHOLHDL 2.5 05/27/2021 0138   VLDL 13 05/27/2021 0138   LDLCALC 61 05/27/2021 0138   LDLCALC 60 02/20/2021 1215     Risk Assessment/Calculations:           Physical Exam:    VS:  BP 138/78   Pulse 65   Ht 5\' 10"  (1.778 m)   Wt 251 lb 12.8 oz (114.2 kg)   SpO2 93%   BMI 36.13 kg/m     Wt Readings from Last 3 Encounters:  08/24/21 251 lb 12.8 oz (114.2 kg)  08/18/21 252 lb (114.3 kg)  08/15/21 252 lb 3.2 oz (114.4 kg)     GEN:  Well nourished, well developed in no acute distress HEENT: Normal NECK: No JVD; No carotid bruits LYMPHATICS: No lymphadenopathy CARDIAC: RRR, no murmurs, rubs, gallops RESPIRATORY:  Clear to auscultation without rales, wheezing or rhonchi  ABDOMEN: Soft, non-tender, non-distended MUSCULOSKELETAL:  No edema; No deformity  SKIN: Warm and dry NEUROLOGIC:  Alert and oriented x 3 PSYCHIATRIC:  Normal affect   ASSESSMENT:    1. PFO (patent foramen ovale)    PLAN:    In order of problems listed above:  The patient has a PFO with evidence of right-to-left shunt based on positive bubble study and color doppler evaluation on recent TEE. 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 I monitored  the patient's O2 saturation lying in the supine position, standing position, and with walking today in the office. He did not desaturate during today's evaluation but O2 desaturation has been documented during recent evaluations and the patient is now O2 dependent. Pulmonary evaluation has been reviewed and there has been multidisciplinary discussion between, myself, Dr 10/15/21 (the patient's cardiologist), and Dr Mayford Knife (pulmonologist). The patient does have a dilated aortic root which has been associated with orthodeoxia/platypnea syndrome. The degree of shortness of breath and hypoxemia is felt to be out of proportion to his lung disease. We discussed transcatheter PFO closure in detail today with full review of the procedural indications, potential risks and benefits, and a step-by-step review of the procedure. The patient understands the need for SBE prophylaxis x 6 months when indicated. He understands procedural risks of serious complications such as stroke, MI, vascular injury, infection, major bleeding, arrhythmia, device embolization, cardiac injury, tamponade, need for emergency surgery, and death, are all very low and expected to occur in less than 1% of cases. He provides full informed consent for transcatheter PFO closure.         Medication Adjustments/Labs and Tests Ordered: Current medicines are reviewed at length with the patient today.  Concerns regarding medicines are outlined above.  No orders of the defined types were placed in this encounter.  Meds ordered this encounter  Medications   aspirin EC 81 MG tablet    Sig: Take 1 tablet (81 mg total) by mouth daily. Swallow whole.    Dispense:  90 tablet    Refill:  3  Patient Instructions  CLOSURE INSTRUCTIONS (09/06/2021): You are scheduled for a PFO CLOSURE on Wednesday, September 28 with Dr. Tonny Bollman.  1. Please arrive at the [facility] (Main Entrance A) at [facility]: [address] [place], [place]  [address] at 10:30 AM (This time is two hours before your procedure to ensure your preparation). Free valet parking service is available. You are allowed ONE visitor in the waiting room during your procedure. Both you and your guest must wear masks. Special note: Every effort is made to have your procedure done on time. Please understand that emergencies sometimes delay scheduled procedures.  2. Diet: Make sure to stay well hydrated the day before your procedure! Do not eat solid foods after midnight.  You may have clear liquids until 5am upon the day of the procedure.  3. Medication instructions in preparation for your procedure:  1) START ASPIRIN 81 mg 5 days prior to your closure (start 09/01/2021) 2) HOLD ELIQUIS the night before and morning of your procedure.  2) MAKE SURE TO TAKE YOUR ASPIRIN the morning of your procedure  3) Hold Lasix and hydralazine the morning of your procedure   4) Other meds may be taken as directed with a sip of water.  4. Plan for one night stay--bring personal belongings (this is a disclaimer, not an intention. We want you to go home!). 5. Bring a current list of your medications and current insurance cards. 6. You MUST have a responsible person to drive you home. 7. Someone MUST be with you the first 24 hours after you arrive home or your discharge will be delayed. 8. Please wear clothes that are easy to get on and off and wear slip-on shoes.   FOLLOW-UP APPOINTMENT: Please keep your follow-up appointment with Dr. Mayford Knife on 11/2.   Signed, Tonny Bollman, MD  08/28/2021 5:08 PM    [facility]

## 2021-08-24 NOTE — Patient Instructions (Addendum)
CLOSURE INSTRUCTIONS (09/06/2021): You are scheduled for a PFO CLOSURE on Wednesday, September 28 with Dr. Tonny Bollman.  1. Please arrive at the [facility] (Main Entrance A) at [facility]: [address] [place], [place] [address] at 10:30 AM (This time is two hours before your procedure to ensure your preparation). Free valet parking service is available. You are allowed ONE visitor in the waiting room during your procedure. Both you and your guest must wear masks. Special note: Every effort is made to have your procedure done on time. Please understand that emergencies sometimes delay scheduled procedures.  2. Diet: Make sure to stay well hydrated the day before your procedure! Do not eat solid foods after midnight.  You may have clear liquids until 5am upon the day of the procedure.  3. Medication instructions in preparation for your procedure:  1) START ASPIRIN 81 mg 5 days prior to your closure (start 09/01/2021) 2) HOLD ELIQUIS the night before and morning of your procedure.  2) MAKE SURE TO TAKE YOUR ASPIRIN the morning of your procedure  3) Hold Lasix and hydralazine the morning of your procedure   4) Other meds may be taken as directed with a sip of water.  4. Plan for one night stay--bring personal belongings (this is a disclaimer, not an intention. We want you to go home!). 5. Bring a current list of your medications and current insurance cards. 6. You MUST have a responsible person to drive you home. 7. Someone MUST be with you the first 24 hours after you arrive home or your discharge will be delayed. 8. Please wear clothes that are easy to get on and off and wear slip-on shoes.   FOLLOW-UP APPOINTMENT: Please keep your follow-up appointment with Dr. Mayford Knife on 11/2.

## 2021-08-28 ENCOUNTER — Encounter: Payer: Self-pay | Admitting: Cardiovascular Disease

## 2021-08-28 NOTE — Telephone Encounter (Signed)
Late entry patient returned call 08/27/21 and stated has been feeling about the same.  Blood pressures in the am before taking medication 140/90s and a couple hours after medication 120/70s.  RA sp02 95-96% in the morning but if active continues to drop and lowest on 08/27/21 was 87% while trying to help spouse with home project.  Patient stated he had discussed PFO closure in detail with Dr Excell Seltzer and had no questions or concerns.  He has follow up appt with Dr Francine Graven, pulmonology prior to PFO closure but is wondering if Dr Francine Graven will reschedule that appt to see if any improvement after PFO closure in his pulmonary symptoms.  Discussed with patient I would follow up with him again next weekend via telephone.  Patient A&Ox3 spoke full sentences without difficulty no cough/nasal congestion or throat clearing noted during 5 minute telephone call.  HR notified procedure and specialty follow up appts continue end of month.  Patient verbalized understanding information/instructions, agreed with plan of care and had no further questions at this time.

## 2021-08-29 ENCOUNTER — Telehealth: Payer: Self-pay | Admitting: Cardiology

## 2021-08-29 NOTE — Telephone Encounter (Signed)
  Are you calling in reference to your FMLA or disability form? yes   What is your question in regards to FMLA or disability form? Calling to see if we received the fax from his insurance. And if so have the papers been filled out   Do you need copies of your medical records? Yes please   Are you waiting on a nurse to call you back with results or are you wanting copies of your results? Patient would like a call back to know if it was received or not.     Please route to Medical Records or your medical records site representative

## 2021-08-31 ENCOUNTER — Telehealth: Payer: Self-pay | Admitting: Cardiology

## 2021-08-31 DIAGNOSIS — Z0279 Encounter for issue of other medical certificate: Secondary | ICD-10-CM

## 2021-08-31 NOTE — Telephone Encounter (Signed)
Forms from [facility] received on 08/31/2021. Completed patient auth attached. Took form to Dr. Mayford Knife box for completion. JMM 08/29/2021

## 2021-09-05 ENCOUNTER — Ambulatory Visit (INDEPENDENT_AMBULATORY_CARE_PROVIDER_SITE_OTHER): Payer: No Typology Code available for payment source | Admitting: Pulmonary Disease

## 2021-09-05 ENCOUNTER — Other Ambulatory Visit: Payer: Self-pay

## 2021-09-05 ENCOUNTER — Telehealth: Payer: Self-pay | Admitting: *Deleted

## 2021-09-05 ENCOUNTER — Encounter: Payer: Self-pay | Admitting: Cardiovascular Disease

## 2021-09-05 ENCOUNTER — Encounter: Payer: Self-pay | Admitting: Pulmonary Disease

## 2021-09-05 VITALS — BP 118/82 | HR 51 | Temp 97.9°F | Ht 70.0 in | Wt 254.2 lb

## 2021-09-05 DIAGNOSIS — G4733 Obstructive sleep apnea (adult) (pediatric): Secondary | ICD-10-CM

## 2021-09-05 DIAGNOSIS — R001 Bradycardia, unspecified: Secondary | ICD-10-CM | POA: Diagnosis not present

## 2021-09-05 DIAGNOSIS — J9611 Chronic respiratory failure with hypoxia: Secondary | ICD-10-CM

## 2021-09-05 DIAGNOSIS — I2699 Other pulmonary embolism without acute cor pulmonale: Secondary | ICD-10-CM

## 2021-09-05 NOTE — Telephone Encounter (Addendum)
PFO closure scheduled at [facility] for: Wednesday September 06, 2021 12:30 PM Arrive [facility] Main Entrance [facility]) at: 10:30 AM   No solid food after midnight prior to cath, clear liquids until 5 AM day of procedure.  Medication instructions: Hold: -Eliquis-none PM 09/05/21 and AM 09/06/21 -Lasix/KCl-AM of procedure -Spironolactone-AM of procedure -Chlorthalidone-AM of procedure  Except hold medications usual morning medications can be taken pre-cath with sips of water including aspirin 81 mg. Patient reports he did start aspirin 5 days ago.    Confirmed patient has responsible adult to drive home post procedure and be with patient first 24 hours after arriving home.   You are allowed one visitor in the waiting room during the time you are at the hospital for your procedure. Both you and your visitor must wear a mask once you enter the hospital.   Patient reports does not currently have any symptoms concerning for COVID-19 and no household members with COVID-19 like illness.      Reviewed procedure /mask/visitor instructions with patient.

## 2021-09-05 NOTE — Progress Notes (Signed)
Spoke with Dr Francine Graven who has the patient in his office.  He reports that the patient has had a very slow heart rate over the last few weeks and in his office, even with walking the patient, his heart rate is in the 40s.  He is obtaining an EKG.  In addition, the patient is not hypoxemic with walking.  This is the second time the patient has been walked in our offices where his oxygen saturations have remained well above 92% off of O2.  We agree that his PFO closure procedure should be delayed until he undergoes further evaluation regarding his heart rate.  The indication for PFO closure was orthodeoxia and this has not been documented on recent pulse oximetry with ambulation.  We will cancel his procedure for tomorrow.  Will reach out to the device clinic to see if they can interrogate his recently implanted device for better heart rate evaluation.  The patient will hold his carvedilol and verapamil until further notification.  He may be having frequent PVCs as an etiology of this.  Tonny Bollman 09/05/2021 5:38 PM

## 2021-09-05 NOTE — Progress Notes (Signed)
Synopsis: Referred by Bernadette Hoit, MD for post-covid 19 dyspnea  Subjective:   PATIENT ID: Ryan Glass GENDER: male DOB: November 15, 1956, MRN: [id number]  HPI  Chief Complaint  Patient presents with   Follow-up    6 wk f/u, needs to be re-qualified for O2. Still using 3L of O2. States he has been more SOB since last visit.    Ryan Glass is a 65 year old male, never smoker with hypertension, rheumatoid arthritis and obstructive sleep apnea on CPAP who returns to pulmonary clinic for respiratory failure.   He was admitted in June 2022 for progressive dyspnea and chest pain where he was found to have a segmental pulmonary emboli after traveling to [place] and having dental implant surgery done. He was treated with heparin and then transitioned to eliquis. He was discharged home on supplemental oxygen as he had desaturations in his SpO2 at that time.   Echo 05/26/21 showed LVEF 45-50% (down from 60-65% 02/08/21) with severe asymetric left ventricular hypertrophy. Grade I diastolic dysfunction. RV systolic function is moderately reduced and RV size is moderately enlarged with RVSP 50.49mmHg. His echo on 02/08/21 previously showed normal RV function and mildly enlarged size. There is dilation in the RA and LA on both the 6/17 and 3/2 echos.   LHC 05/29/2021 showed minimal CAD and RHC showed normal pressures. RV 26/1, PA 25/4, PA mean 14, PW mean 12, LVEDP 8.  HRCT Chest on 06/07/21 shows band like scarring or partial atelectasis of the dependent lungs which is unchanged from prior studies. No air trapping noted. No subpleural reticulation noted.   PFTs 11/2020 normal ratio, no airway obstruction, no restriction, DLCO 71%. Repeat PFTs 08/01/21 show normal ratio, no obstruction or restriction and DLCO 89%.   He again had oxygen desaturations and required 2L of supplemental oxygen with ambulation at last visit on 07/20/21.   He was admitted 08/02/21 for concerns of ventricular trachycardia noted on his Zio  extended cardiac monitor. TTE bubble study on 08/03/21 showed positive shunting within 3-6 cardiac cycles suggestive of interatrial shunt. The echo was also concerning for hypertrophic cardiomyopathy. He under went ICD placement on 08/07/21. He was discharged home with carvedilol and verapamil for rate control medications.  Discussions were held between his cardiology team and myself regarding his dyspnea out of proportion to his testing results in regards to his HRCT chest scan and recent PFTs. We decided to move forward with closure of his PFO as he had shunt activity noted on TTE and TEE.  Today in clinic patient did not have desaturations in his SpO2 which is similar to Dr. Earmon Phoenix findings in clinic on 08/24/21. The patient walked a total of 5 laps without decline in his O2 today. Patient's heart rate did drop into the 40s with increased activity. Patient has HR, BP and SpO2 diary which indicates he has had a heart rate in the 30-40s since 9/16-9/17. He does complain of dizziness and feeling light headed at times.   Past Medical History:  Diagnosis Date   Ascending aortic aneurysm (HCC)    a. CT 05/2021 showed 4.4x4.4cm TAA.   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 There is severe asymmetric left ventricular hypertrophy. G1DD present.    History of COVID-19 06/07/2021   HOCM (hypertrophic obstructive cardiomyopathy) (HCC)    s/p ICD 07/2021   Hypertension    followed by cardiology, dr t. turner   (05-14-2018 nuclear study in epic , normal perfusion with nuclear ef 61%)   Left hydrocele    Low serum potassium level  04/27/2014   OSA on CPAP    per pt uses every night   Palpitations followed by dr t. Mayford Knife   06-08-2020  still feels palipations due to PVCs when exertion but not with chest pain/ discomfort   Pneumonia due to COVID-19 virus    PVC's (premature ventricular contractions) cardiologist--- dr t. turner   Status post PVC ablation by Dr. Ladona Ridgel 2019 with recurrence of frequent PVCs/bigeminy;  prior pseudobradycardia r/t pvcs   Rheumatoid arthritis involving multiple sites [facility])    rheumotology--- dr a. Sharmon Revere  ([facility] in [place])     Family History  Problem Relation Age of Onset   Cardiomyopathy Mother    Heart attack Father      Social History   Socioeconomic History   Marital status: Married    Spouse name: Not on file   Number of children: Not on file   Years of education: Not on file   Highest education level: Not on file  Occupational History   Not on file  Tobacco Use   Smoking status: Never   Smokeless tobacco: Never  Vaping Use   Vaping Use: Never used  Substance and Sexual Activity   Alcohol use: Yes    Alcohol/week: 5.0 - 7.0 standard drinks    Types: 5 - 7 Cans of beer per week   Drug use: Never   Sexual activity: Not on file  Other Topics Concern   Not on file  Social History Narrative   Not on file   Social Determinants of Health   Financial Resource Strain: Not on file  Food Insecurity: Not on file  Transportation Needs: Not on file  Physical Activity: Not on file  Stress: Not on file  Social Connections: Not on file  Intimate Partner Violence: Not on file     Allergies  Allergen Reactions   Amlodipine Besylate Swelling    Leg swelling with 10mg  daily am dosing; decreased with BID 5mg  dosing   Aspirin Itching     Outpatient Medications Prior to Visit  Medication Sig Dispense Refill   acetaminophen-codeine (TYLENOL #3) 300-30 MG tablet Take 1-2 tablets by mouth every 12 (twelve) hours as needed for moderate pain.     albuterol (VENTOLIN HFA) 108  (90 Base) MCG/ACT inhaler Inhale 2 puffs into the lungs every 4 (four) hours as needed for wheezing or shortness of breath. 8 g 2   apixaban (ELIQUIS) 5 MG TABS tablet Take 1 tablet (5 mg total) by mouth 2 (two) times daily. 60 tablet 5   aspirin EC 81 MG tablet Take 1 tablet (81 mg total) by mouth daily. Swallow whole. 90 tablet 3   atorvastatin (LIPITOR) 40 MG tablet Take 1 tablet (40 mg total) by mouth daily. 90 tablet 3   carvedilol (COREG) 6.25 MG tablet Take 1 tablet (6.25 mg total) by mouth 2 (two) times daily with a meal. 60 tablet 6   chlorthalidone (HYGROTON) 25 MG tablet Take 25 mg by mouth daily.     colchicine 0.6 MG tablet  Take 1-2 tablets by mouth daily as needed (gout).      fluticasone (FLONASE) 50 MCG/ACT nasal spray Place 1 spray into both nostrils daily. (Patient taking differently: Place 2 sprays into both nostrils at bedtime.)     Fluticasone-Salmeterol (ADVAIR) 250-50 MCG/DOSE AEPB Inhale 1 puff into the lungs every 12 (twelve) hours. 60 each 11   folic acid (FOLVITE) 1 MG tablet Take 1 mg by mouth daily.     furosemide (LASIX) 40 MG tablet Take 1 tablet (40 mg total) by mouth as needed. Take 1 tablet by mouth daily for 3 days, then decrease to only as needed for increase weight gain 30 tablet 3   gabapentin (NEURONTIN) 600 MG tablet Take 600 mg by mouth 3 (three) times daily.      HUMIRA PEN 40 MG/0.4ML PNKT Inject 40 mg as directed every 14 (fourteen) days.     hydrALAZINE (APRESOLINE) 50 MG tablet Take 1 tablet (50 mg total) by mouth 3 (three) times daily. (Patient taking differently: Take 50 mg by mouth 2 (two) times daily.) 270 tablet 3   losartan (COZAAR) 100 MG tablet Take 1 tablet (100 mg total) by mouth daily. 90 tablet 3   methotrexate 2.5 MG tablet Take 15 mg by mouth every Sunday.     montelukast (SINGULAIR) 10 MG tablet Take 1 tablet (10 mg total) by mouth at bedtime. 90 tablet 0   Potassium Chloride ER 20 MEQ TBCR Take 20 mEq by mouth in the morning and at  bedtime.     predniSONE (DELTASONE) 5 MG tablet Take 5 mg by mouth daily.     sildenafil (REVATIO) 20 MG tablet Take 20 mg by mouth daily as needed (erectile dysfunction).     spironolactone (ALDACTONE) 25 MG tablet Take 0.5 tablets (12.5 mg total) by mouth daily. 30 tablet 0   verapamil (CALAN-SR) 180 MG CR tablet Take 1 tablet (180 mg total) by mouth daily. 30 tablet 6   No facility-administered medications prior to visit.    Review of Systems  Constitutional:  Positive for malaise/fatigue. Negative for chills, fever and weight loss.  HENT:  Negative for congestion and sore throat.   Eyes: Negative.   Respiratory:  Positive for shortness of breath. Negative for cough, hemoptysis, sputum production and wheezing.   Cardiovascular:  Positive for palpitations and leg swelling. Negative for chest pain, orthopnea and claudication.  Gastrointestinal:  Negative for abdominal pain, heartburn, nausea and vomiting.  Genitourinary: Negative.   Musculoskeletal: Negative.   Neurological:  Positive for dizziness. Negative for weakness and headaches.  Endo/Heme/Allergies: Negative.   Psychiatric/Behavioral: Negative.     Objective:   Vitals:   09/05/21 1627  BP: 118/82  Pulse: (!) 51  Temp: 97.9 F (36.6 C)  TempSrc: Oral  SpO2: 99%  Weight: 254 lb 3.2 oz (115.3 kg)  Height: 5\' 10"  (1.778 m)    Physical Exam Constitutional:      General: He is not in acute distress.    Appearance: He is obese.  HENT:     Head: Normocephalic and atraumatic.  Eyes:     Conjunctiva/sclera: Conjunctivae normal.  Cardiovascular:     Rate and Rhythm: Regular rhythm. Bradycardia present.     Pulses: Normal pulses.     Heart sounds: Normal heart sounds. No murmur heard. Pulmonary:     Effort: Pulmonary effort is normal.     Breath sounds: Normal breath sounds. No wheezing, rhonchi or rales.  Abdominal:     General: Bowel sounds  are normal.     Palpations: Abdomen is soft.  Musculoskeletal:     Right  lower leg: No edema.     Left lower leg: No edema.  Skin:    General: Skin is warm and dry.  Neurological:     General: No focal deficit present.     Mental Status: He is alert.  Psychiatric:        Mood and Affect: Mood normal.        Behavior: Behavior normal.        Thought Content: Thought content normal.        Judgment: Judgment normal.    CBC    Component Value Date/Time   WBC 9.5 08/15/2021 1052   WBC 5.5 08/02/2021 2116   RBC 4.09 (L) 08/15/2021 1052   RBC 3.89 (L) 08/02/2021 2116   HGB 13.8 08/15/2021 1052   HCT 40.4 08/15/2021 1052   PLT 193 08/15/2021 1052   MCV 99 (H) 08/15/2021 1052   MCH 33.7 (H) 08/15/2021 1052   MCH 34.4 (H) 08/02/2021 2116   MCHC 34.2 08/15/2021 1052   MCHC 33.1 08/02/2021 2116   RDW 13.6 08/15/2021 1052   LYMPHSABS 1.2 06/06/2021 1703   LYMPHSABS 0.8 02/20/2021 1215   MONOABS 0.5 06/06/2021 1703   EOSABS 0.0 06/06/2021 1703   EOSABS 0.0 02/20/2021 1215   BASOSABS 0.0 06/06/2021 1703   BASOSABS 0.0 02/20/2021 1215   BMP Latest Ref Rng & Units 08/15/2021 08/08/2021 08/02/2021  Glucose 65 - 99 mg/dL 82 95 086(V)  BUN 8 - 27 mg/dL Creatinine 0.76 - 1.27 mg/dL 7.84 6.96 2.95  BUN/Creat Ratio 10 - 24 15 - -  Sodium 134 - 144 mmol/L 138 136 136  Potassium 3.5 - 5.2 mmol/L 4.2 4.1 3.8  Chloride 96 - 106 mmol/L 103 104 107  CO2 20 - 29 mmol/L 20 23 21(L)  Calcium 8.6 - 10.2 mg/dL 9.8 9.2 9.1   Chest imaging: HRCT Chest 06/07/21 1. Bland appearing, bandlike scarring and or partial atelectasis of the dependent lungs, unchanged compared to prior examination. No evidence of fibrotic interstitial lung disease. No significant air trapping on expiratory phase imaging. 2. Cardiomegaly and coronary artery disease. 3. Unchanged enlargement of the tubular ascending thoracic aorta, measuring up to 4.4 x 4.4 cm. Recommend annual imaging followup by CTA or MRA if not otherwise imaged.  CXR 07/12/20 There are low lung volumes with central  vascular crowding and additional bandlike opacities in the periphery of the left mid lung which could reflect subsegmental atelectasis or scarring. More patchy right infrahilar opacity with airways thickening is present. No pneumothorax or visible effusion. Cardiomediastinal contours are unremarkable. No acute osseous or soft tissue abnormality. Degenerative changes are present in the imaged spine and shoulders. Telemetry leads overlie the chest.  PFT: PFT Results Latest Ref Rng & Units 08/01/2021 11/16/2020  FVC-Pre L 3.75 4.14  FVC-Predicted Pre % 84 92  FVC-Post L 3.65 4.01  FVC-Predicted Post % 82 89  Pre FEV1/FVC % % 78 82  Post FEV1/FCV % % 82 85  FEV1-Pre L 2.91 3.38  FEV1-Predicted Pre % 87 101  FEV1-Post L 2.99 3.40  DLCO uncorrected ml/min/mmHg 23.24 18.55  DLCO UNC% % 89 71  DLCO corrected ml/min/mmHg 25.93 18.55  DLCO COR %Predicted % 99 71  DLVA Predicted % 100 86  TLC L 6.95 7.23  TLC % Predicted % 102 106  RV % Predicted % 126 121  PFT 2021: Mild diffusion  defect.  PFT 2022: within normal limits  Echo:  08/18/21 - TEE LVEF 55-60%. Mild left ventricular hypertrophy. RV systolic function is moderately reduced. RV is moderately enlarged. Atrial level shunt noted by color flow and agitated saline. RA mildly dilated.   05/26/21 LVEF 45 to 50%.  Severe asymmetric left ventricular hypertrophy.  Grade 1 diastolic dysfunction.  RV systolic function is moderately reduced.  RV size is moderately enlarged.  Moderately elevated pulmonary arterial systolic pressure.  Left atrium size is moderately dilated.  Right atrial size is mildly dilated.  02/08/21 LVEF 60-65%. Moderate LVH. RV systolic function is normal. RV is mildly enlarged.      Assessment & Plan:   Chronic respiratory failure with hypoxia (HCC) - Plan: 6 minute walk  Pulmonary embolism, other, unspecified chronicity, unspecified whether acute cor pulmonale present (HCC)  OSA (obstructive sleep apnea) - Plan: Cpap  titration  Bradycardia - Plan: EKG 12-Lead, EKG 12-Lead  Discussion: Ryan Glass is a 65 year old male, never smoker with hypertension, rheumatoid arthritis and obstructive sleep apnea on CPAP who returns to pulmonary clinic for respiratory failure.   His recent ambulatory SpO2 monitoring does not indicate desaturations below 88% in our clinic today and at the cardiology clinic on 9/15. He is having issues with bradycardia at this time as noted on his walked with a HR in the 40s. He has a vitals diary which indicates trouble with bradycardia since 9/16 or 9/17. He does have associated dizziness/light headedness. I spoke with Dr.Cooper on the phone and he was able to review EKG from clinic today which is showing a PVC after each paced beat. He has arranged for follow up in the device clinic for further interogation of his recent heart rate changes. We will hold his carvedilol and verapamil at this time until further notice from his cardiology team.   We will arrange for 6 minute walk test to determine if he needs supplemental O2 with extended ambulation. Otherwise he may not require oxygen. This also decreases the concern for significant interatrial shunt physiology so we will hold off on the shunt closure procedure at this time.   We will schedule the patient for CPAP titration study to ensure he is receiving adequate nocturnal PAP support.   He would benefit from cardiopulmonary rehab as there may be a component of deconditioning occurring as he has decreased activity since the end of last year.  Follow up in 4 weeks.  >60 minutes was spent on this visit in coordination with other specialties for his care, review of tests and completion of documentation.  Melody Comas, MD [facility] Office: [phone]   See Amion for Pager Details     Current Outpatient Medications:    acetaminophen-codeine (TYLENOL #3) 300-30 MG tablet, Take 1-2 tablets by mouth every 12  (twelve) hours as needed for moderate pain., Disp: , Rfl:    albuterol (VENTOLIN HFA) 108 (90 Base) MCG/ACT inhaler, Inhale 2 puffs into the lungs every 4 (four) hours as needed for wheezing or shortness of breath., Disp: 8 g, Rfl: 2   apixaban (ELIQUIS) 5 MG TABS tablet, Take 1 tablet (5 mg total) by mouth 2 (two) times daily., Disp: 60 tablet, Rfl: 5   aspirin EC 81 MG tablet, Take 1 tablet (81 mg total) by mouth daily. Swallow whole., Disp: 90 tablet, Rfl: 3   atorvastatin (LIPITOR) 40 MG tablet, Take 1 tablet (40 mg total) by mouth daily., Disp: 90 tablet, Rfl: 3  carvedilol (COREG) 6.25 MG tablet, Take 1 tablet (6.25 mg total) by mouth 2 (two) times daily with a meal., Disp: 60 tablet, Rfl: 6   chlorthalidone (HYGROTON) 25 MG tablet, Take 25 mg by mouth daily., Disp: , Rfl:    colchicine 0.6 MG tablet, Take 1-2 tablets by mouth daily as needed (gout). , Disp: , Rfl:    fluticasone (FLONASE) 50 MCG/ACT nasal spray, Place 1 spray into both nostrils daily. (Patient taking differently: Place 2 sprays into both nostrils at bedtime.), Disp: , Rfl:    Fluticasone-Salmeterol (ADVAIR) 250-50 MCG/DOSE AEPB, Inhale 1 puff into the lungs every 12 (twelve) hours., Disp: 60 each, Rfl: 11   folic acid (FOLVITE) 1 MG tablet, Take 1 mg by mouth daily., Disp: , Rfl:    furosemide (LASIX) 40 MG tablet, Take 1 tablet (40 mg total) by mouth as needed. Take 1 tablet by mouth daily for 3 days, then decrease to only as needed for increase weight gain, Disp: 30 tablet, Rfl: 3   gabapentin (NEURONTIN) 600 MG tablet, Take 600 mg by mouth 3 (three) times daily. , Disp: , Rfl:    HUMIRA PEN 40 MG/0.4ML PNKT, Inject 40 mg as directed every 14 (fourteen) days., Disp: , Rfl:    hydrALAZINE (APRESOLINE) 50 MG tablet, Take 1 tablet (50 mg total) by mouth 3 (three) times daily. (Patient taking differently: Take 50 mg by mouth 2 (two) times daily.), Disp: 270 tablet, Rfl: 3   losartan (COZAAR) 100 MG tablet, Take 1 tablet (100 mg  total) by mouth daily., Disp: 90 tablet, Rfl: 3   methotrexate 2.5 MG tablet, Take 15 mg by mouth every Sunday., Disp: , Rfl:    montelukast (SINGULAIR) 10 MG tablet, Take 1 tablet (10 mg total) by mouth at bedtime., Disp: 90 tablet, Rfl: 0   Potassium Chloride ER 20 MEQ TBCR, Take 20 mEq by mouth in the morning and at bedtime., Disp: , Rfl:    predniSONE (DELTASONE) 5 MG tablet, Take 5 mg by mouth daily., Disp: , Rfl:    sildenafil (REVATIO) 20 MG tablet, Take 20 mg by mouth daily as needed (erectile dysfunction)., Disp: , Rfl:    spironolactone (ALDACTONE) 25 MG tablet, Take 0.5 tablets (12.5 mg total) by mouth daily., Disp: 30 tablet, Rfl: 0   verapamil (CALAN-SR) 180 MG CR tablet, Take 1 tablet (180 mg total) by mouth daily., Disp: 30 tablet, Rfl: 6

## 2021-09-05 NOTE — Patient Instructions (Addendum)
Hold your carvedilol and Verapamil medications until further instructions from Dr. Excell Seltzer or myself.  I spoke with Dr. Excell Seltzer, we will cancel your procedure for now. He will schedule you an appointment in the office to check your ICD device.   We will schedule you for a 6 minute walk test in 1-2 weeks.  We will schedule you for a CPAP titration study at the [facility].

## 2021-09-06 ENCOUNTER — Encounter: Payer: Self-pay | Admitting: Pulmonary Disease

## 2021-09-06 ENCOUNTER — Encounter (HOSPITAL_COMMUNITY): Admission: RE | Payer: Self-pay | Source: Ambulatory Visit

## 2021-09-06 ENCOUNTER — Telehealth: Payer: Self-pay | Admitting: Cardiovascular Disease

## 2021-09-06 ENCOUNTER — Ambulatory Visit (HOSPITAL_COMMUNITY)
Admission: RE | Admit: 2021-09-06 | Payer: No Typology Code available for payment source | Source: Ambulatory Visit | Admitting: Cardiovascular Disease

## 2021-09-06 SURGERY — PATENT FORAMEN OVALE (PFO) CLOSURE
Anesthesia: LOCAL

## 2021-09-06 NOTE — Progress Notes (Signed)
Spoke with patient, assisted with sending manual transmission.  Pt questioned if he was having his procedure today.  Advised patient that today's procedure has been postponed, pending further assessment.  Dr. Earmon Phoenix nurse will contact him when procedure is to be rescheduled.

## 2021-09-06 NOTE — Telephone Encounter (Signed)
The patient understands closure is on hold for now. Confirmed appointment with [name] on Monday.  The patient reports he needs a note stating he can return to work. 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 He will contact Pulmonary about working in the meantime. He will speak with Luster Landsberg about working on Monday.

## 2021-09-06 NOTE — Telephone Encounter (Signed)
Noted in Epic patient PFO closure for today cancelled today after bradycardia even with exertion in pulmonary office and no hypoxia with ambulation on room air.  Dr Francine Graven consulted with Dr Excell Seltzer and patient has follow up 09/11/21 with cardiology and was instructed to change carvedilol and verapamil dosing.  Will follow up with patient via telephone.  HR notified patient has follow up with specialist and procedure cancelled.  Noted patient has asked to return to work but not yet cleared to do so.

## 2021-09-06 NOTE — Telephone Encounter (Signed)
Patient states he spoke with Dr. Earmon Phoenix nurse today and was told he would receive a call back, but has not heard anything yet.

## 2021-09-06 NOTE — Progress Notes (Deleted)
Cardiology Office Note Date:  09/06/2021  Patient ID:  Ryan Glass [birth year], MRN [id number] PCP:  [name], MD  Cardiologist:  Dr. Mayford Knife Electrophysiologist: Dr. Ladona Ridgel  ***refresh   Chief Complaint: ***  History of Present Illness: Ryan Glass is a 65 y.o. male with history of  PVCs (ablated 2019 by Dr. Ladona Ridgel) with recurrence since then, RBBB, TAA, CKD (II), HTN, RA(chronic therapy with Humira, methotrexate, prednisone), OSA w/CPAP, small PEnew AFib  Of late he saw Dr. Ladona Ridgel April 2022, he recaped hx of PVCs and was s/p catheter ablation in 2019. 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 He had worn another monitor and found to have recurrent PVCs (10% burden), not felt yet to warrant AAD, recommended consideration to switch to BB though left to Dr. Mayford Knife his primary cardiologist.(Though looks like he was on Bystolic at tat time) Lasix was increased for some diastolic HF.   He was hospitalized June 2022 for CP/SOB he had an out pt CT for that found a small PE, symptoms seem out of proportion to his small PE and R/LHC was pursued and an echo. He was found with minimal, non-obstructive CAD, RAP , PAP 25/3mmHg with mean PAP , mean PCWP and LVEDP  Echo was read with LVEF 40-45 with asymmetrical LVH,  though Dr. Mayford Knife felt the echo in fact noted normal LVEF and RV function as well and recommended that a pulm eval be requested. During this stay he was noted to have frequent PVCs on telemetry, resumed on his home diltiazem Discharged on Eliquis 2/2 PE   At his cardiology appt 07/11/21 he continued to c/o SOB/DOE with minimal activity, mentioned seeing pulmonary and started on O2 though not wearing regularly, reported HRs 35-40 at times at hom, eating out with fast foods and noted some  LE swelling. He was advised to wear his O2 as directed by pulmonary Recommended 3 days of his PRN lasix and reduce his salt intake Planned a 1 week monitor to evaluate pt's concerns about slow HRs though suspect to be his PVCs.   His monitor read noting sustained VT ( ?) and new brief AFib associated with symptoms and recommended to go to the ER.  He was admitted 08/02/21 and EP consulted. 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 The Alpha gal result was pending.  Subsequently out patient pulmonary requested TEE to further evaluate his small PFO for a possible etiology of his SOB >>> TEE noted moderate PFO Positive bubble with right to left shunt especially with valsalva and abdominal pressure >> referred to Dr. 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 Francine Graven and Dr. Excell Seltzer with office visit with good ambulating O2 sats OFF O2, and reported HRs in the 40's.  Was decided to pursue evaluation into slow HRs 1st rather then PFO closure  Alpha galactosidase, serum was wnl, 57.2 (negative for deficiency)  *** bleeding, labs, eliquis *** PVC burden (?), AFib burden *** acute implant outputs *** symptoms, SOB, palps... *** Observed bradycardia by other providers, ? PVCs  Device information Abbott dual chamber ICD implanted 08/07/21, secondary prevention Hx of sustained VT and HCM    Past Medical History:  Diagnosis Date   Ascending aortic aneurysm (HCC)    a. CT 05/2021 showed 4.4x4.4cm TAA.   Bilateral lower extremity edema    Chronic gout without tophus    followed by dr Sharmon Revere    (06-08-2020  per pt last episode right knee 3 wks ago)   CKD (chronic kidney disease), stage II     nephrology--- [facility] PA (10-29-2019 note in epic scanned in  media)   Heart failure with mid-range ejection fraction [facility]) 05/27/2021   05/26/21: Left ventricular ejection fraction, by estimation, is 45 to 50%. There is severe asymmetric left ventricular hypertrophy. G1DD present.    History of COVID-19 06/07/2021   HOCM (hypertrophic obstructive cardiomyopathy) (HCC)    s/p ICD 07/2021   Hypertension    followed by cardiology, dr t. turner   (05-14-2018 nuclear study in epic , normal perfusion with nuclear ef 61%)   Left hydrocele    Low serum potassium level 04/27/2014   OSA on CPAP    per pt uses every night   Palpitations followed by dr t. Mayford Knife   06-08-2020  still feels palipations due to PVCs when exertion but not with chest pain/ discomfort   Pneumonia due to COVID-19 virus    PVC's (premature ventricular contractions) cardiologist--- dr t. turner   Status post PVC ablation by Dr. Ladona Ridgel 2019 with recurrence of frequent PVCs/bigeminy;  prior pseudobradycardia r/t pvcs   Rheumatoid arthritis involving multiple sites [facility])    rheumotology--- dr a. Sharmon Revere  ([facility] in [place])    Past Surgical History:  Procedure Laterality Date   BUBBLE STUDY  08/18/2021   Procedure: BUBBLE STUDY;  Surgeon: Wendall Stade, MD;  Location: [facility] ENDOSCOPY;  Service: Cardiovascular;;   HYDROCELE EXCISION Left 06/14/2020   Procedure: LEFT  HYDROCELECTOMY ADULT;  Surgeon: Noel Christmas, MD;  Location: [facility];  Service: Urology;  Laterality: Left;   ICD IMPLANT N/A 08/07/2021   Procedure: ICD IMPLANT;  Surgeon: Marinus Maw, MD;  Location: [facility] INVASIVE CV LAB;  Service: Cardiovascular;  Laterality: N/A;   INCISIONAL HERNIA REPAIR  02-23-2016   @[facility]    LAPAROSCOPIC   LAPAROSCOPIC INGUINAL HERNIA REPAIR Bilateral 08-22-2015  @[facility]    AND UMBILICAL HERNIA REPAIR   PVC ABLATION N/A 10/07/2018   Procedure: PVC ABLATION;  Surgeon: , MD;  Location: [facility] INVASIVE CV LAB;   Service: Cardiovascular;  Laterality: N/A;   RIGHT/LEFT HEART CATH AND CORONARY ANGIOGRAPHY N/A 05/29/2021   Procedure: RIGHT/LEFT HEART CATH AND CORONARY ANGIOGRAPHY;  Surgeon: Marinus Maw, MD;  Location: [facility] INVASIVE CV LAB;  Service: Cardiovascular;  Laterality: N/A;   TEE WITHOUT CARDIOVERSION N/A 08/18/2021   Procedure: TRANSESOPHAGEAL ECHOCARDIOGRAM (TEE);  Surgeon: Kathleene Hazel, MD;  Location: [facility] ENDOSCOPY;  Service: Cardiovascular;  Laterality: N/A;   UMBILICAL HERNIA REPAIR  child    Current Outpatient Medications  Medication Sig Dispense Refill   acetaminophen-codeine (TYLENOL #3) 300-30 MG tablet Take 1-2 tablets by mouth every 12 (twelve) hours as needed for moderate pain.     albuterol (VENTOLIN HFA) 108 (90 Base) MCG/ACT inhaler Inhale 2 puffs into the lungs every 4 (four) hours as needed for wheezing or shortness of breath. 8 g 2   apixaban (ELIQUIS) 5 MG TABS tablet Take 1 tablet (5 mg total) by mouth 2 (two) times daily. 60 tablet 5  aspirin EC 81 MG tablet Take 1 tablet (81 mg total) by mouth daily. Swallow whole. 90 tablet 3   atorvastatin (LIPITOR) 40 MG tablet Take 1 tablet (40 mg total) by mouth daily. 90 tablet 3   carvedilol (COREG) 6.25 MG tablet Take 1 tablet (6.25 mg total) by mouth 2 (two) times daily with a meal. 60 tablet 6   chlorthalidone (HYGROTON) 25 MG tablet Take 25 mg by mouth daily.     colchicine 0.6 MG tablet Take 1-2 tablets by mouth daily as needed (gout).      fluticasone (FLONASE) 50 MCG/ACT nasal spray Place 1 spray into both nostrils daily. (Patient taking differently: Place 2 sprays into both nostrils at bedtime.)     Fluticasone-Salmeterol (ADVAIR) 250-50 MCG/DOSE AEPB Inhale 1 puff into the lungs every 12 (twelve) hours. 60 each 11   folic acid (FOLVITE) 1 MG tablet Take 1 mg by mouth daily.     furosemide (LASIX) 40 MG tablet Take 1 tablet (40 mg total) by mouth as needed. Take 1 tablet by mouth daily for 3 days, then decrease to only  as needed for increase weight gain 30 tablet 3   gabapentin (NEURONTIN) 600 MG tablet Take 600 mg by mouth 3 (three) times daily.      HUMIRA PEN 40 MG/0.4ML PNKT Inject 40 mg as directed every 14 (fourteen) days.     hydrALAZINE (APRESOLINE) 50 MG tablet Take 1 tablet (50 mg total) by mouth 3 (three) times daily. (Patient taking differently: Take 50 mg by mouth 2 (two) times daily.) 270 tablet 3   losartan (COZAAR) 100 MG tablet Take 1 tablet (100 mg total) by mouth daily. 90 tablet 3   methotrexate 2.5 MG tablet Take 15 mg by mouth every Sunday.     montelukast (SINGULAIR) 10 MG tablet Take 1 tablet (10 mg total) by mouth at bedtime. 90 tablet 0   Potassium Chloride ER 20 MEQ TBCR Take 20 mEq by mouth in the morning and at bedtime.     predniSONE (DELTASONE) 5 MG tablet Take 5 mg by mouth daily.     sildenafil (REVATIO) 20 MG tablet Take 20 mg by mouth daily as needed (erectile dysfunction).     spironolactone (ALDACTONE) 25 MG tablet Take 0.5 tablets (12.5 mg total) by mouth daily. 30 tablet 0   verapamil (CALAN-SR) 180 MG CR tablet Take 1 tablet (180 mg total) by mouth daily. 30 tablet 6   No current facility-administered medications for this visit.    Allergies:   Amlodipine besylate and Aspirin   Social History:  The patient  reports that he has never smoked. He has never used smokeless tobacco. He reports current alcohol use of about 5.0 - 7.0 standard drinks per week. He reports that he does not use drugs.   Family History:  The patient's family history includes Cardiomyopathy in his mother; Heart attack in his father.  ROS:  Please see the history of present illness.    All other systems are reviewed and otherwise negative.   PHYSICAL EXAM:  VS:  There were no vitals taken for this visit. BMI: There is no height or weight on file to calculate BMI. Well nourished, well developed, in no acute distress HEENT: normocephalic, atraumatic Neck: no JVD, carotid bruits or  masses Cardiac:  *** RRR; no significant murmurs, no rubs, or gallops Lungs:  *** CTA b/l, no wheezing, rhonchi or rales Abd: soft, nontender MS: no deformity or *** atrophy Ext: *** no edema  Skin: warm and dry, no rash Neuro:  No gross deficits appreciated Psych: euthymic mood, full affect  *** ICD site is stable, no tethering or discomfort   EKG:  not done today  Device interrogation done today and reviewed by myself:  ***    08/03/21: TTE IMPRESSIONS   1. Left ventricular ejection fraction, by estimation, is 60 to 65%. The  left ventricle has normal function. The left ventricle has no regional  wall motion abnormalities. The left ventricular internal cavity size was  mildly dilated. There is mild left  ventricular hypertrophy. Left ventricular diastolic parameters are  consistent with Grade I diastolic dysfunction (impaired relaxation).   2. Right ventricular systolic function is mildly reduced. The right  ventricular size is mildly enlarged.   3. Left atrial size was mildly dilated.   4. The mitral valve is normal in structure. No evidence of mitral valve  regurgitation. No evidence of mitral stenosis.   5. The aortic valve is tricuspid. Aortic valve regurgitation is not  visualized. Mild aortic valve sclerosis is present, with no evidence of  aortic valve stenosis.   6. Aortic dilatation noted. There is moderate dilatation of the aortic  root, measuring 46 mm. There is mild dilatation of the ascending aorta,  measuring 40 mm.   7. Agitated saline contrast bubble study was positive with shunting  observed within 3-6 cardiac cycles suggestive of interatrial shunt.      Monitor 8/7-15/2022 Predominant rhythm was normal sinus rhythm with average heart rate 65bpm and ranged from 42 to 119bpm. Frequent PVCs, ventricular couplets, bigminal PVCs - PVC load 12.6%. Occasional PACs and atrial couplets and triplets Ventricular tachycardia lasting 5 minutes and 11 seconds at  184bpm. Possible atrial fibrillation for 9 minutes and 42 seconds Patient was symptomatic with afib and VT.      05/29/21: R/LHC Prox RCA lesion is 20% stenosed. Prox Cx to Mid Cx lesion is 20% stenosed. Mid LAD lesion is 30% stenosed.   Mild non-obstructive CAD Normal right and left heart pressures   Recommendations: Medical management of mild CAD. Will resume IV heparin 6 hours post sheath pull given acute PE. Would transition to Eliquis or Xarelto tomorrow  05/26/21: TTE (done during hospitalization for acute PE) IMPRESSIONS   1. Left ventricular ejection fraction, by estimation, is 45 to 50%. The  left ventricle has mildly decreased function. The left ventricle has no  regional wall motion abnormalities. There is severe asymmetric left  ventricular hypertrophy. Left ventricular   diastolic parameters are consistent with Grade I diastolic dysfunction  (impaired relaxation).   2. Right ventricular systolic function is moderately reduced. The right  ventricular size is moderately enlarged. There is moderately elevated  pulmonary artery systolic pressure.   3. Left atrial size was moderately dilated.   4. Right atrial size was mildly dilated.   5. The mitral valve is normal in structure. Mild mitral valve  regurgitation.   6. The aortic valve is normal in structure. Aortic valve regurgitation is  not visualized. No aortic stenosis is present.   7. Aortic dilatation noted. There is mild dilatation of the ascending  aorta, measuring 41 mm.    10/07/2018: EPS/Ablation (Dr. Ladona Ridgel) Conclusion: Successful EP study and catheter ablation of symptomatic PVCs originating from the RV inflow tract.  Successful ablation occurred just below the AV node and just apical of the His bundle region.  During RF energy application at the successful site, there was transient accelerated junctional rhythm.  However no heart  block was demonstrated  Recent Labs: 02/06/2021: NT-Pro BNP 47 06/06/2021: TSH  0.975 08/02/2021: ALT 26; B Natriuretic Peptide 50.5; Magnesium 2.0 08/15/2021: BUN 18; Creatinine, Ser 1.19; Hemoglobin 13.8; Platelets 193; Potassium 4.2; Sodium 138  05/27/2021: Cholesterol 123; HDL 49; LDL Cholesterol 61; Total CHOL/HDL Ratio 2.5; Triglycerides 67; VLDL 13   CrCl cannot be calculated (Patient's most recent lab result is older than the maximum 21 days allowed.).   Wt Readings from Last 3 Encounters:  09/05/21 254 lb 3.2 oz (115.3 kg)  08/24/21 251 lb 12.8 oz (114.2 kg)  08/18/21 252 lb (114.3 kg)     Other studies reviewed: Additional studies/records reviewed today include: summarized above  ASSESSMENT AND PLAN:  ICD *** HCM Sustained MMVT, PVCs Hx of prior PVC ablation with return of ectopy *** VT events  Paroxysmal Afib CHA2DS2Vasc is 5, on *** Eliquis, appropriately dosed *** burden  5. PE On eliquis  6. PFO Deferred to Drs Mayford Knife and Excell Seltzer  7. SOB ***   Disposition: F/u with ***  Current medicines are reviewed at length with the patient today.  The patient did not have any concerns regarding medicines.  Norma Fredrickson, PA-C 09/06/2021 12:13 PM     [facility] [address] [place] [address] [phone] (office)  [phone] (fax)

## 2021-09-07 ENCOUNTER — Telehealth: Payer: Self-pay | Admitting: Pulmonary Disease

## 2021-09-07 ENCOUNTER — Telehealth: Payer: Self-pay | Admitting: Registered Nurse

## 2021-09-07 ENCOUNTER — Encounter: Payer: Self-pay | Admitting: Registered Nurse

## 2021-09-07 DIAGNOSIS — J301 Allergic rhinitis due to pollen: Secondary | ICD-10-CM

## 2021-09-07 MED ORDER — MONTELUKAST SODIUM 10 MG PO TABS: 10.0000 mg | ORAL_TABLET | Freq: Every day | ORAL | 3 refills | Status: DC

## 2021-09-07 NOTE — Telephone Encounter (Signed)
Ok to write a work note to be out of work until 09/19/21.   Thanks, Cletis Athens

## 2021-09-07 NOTE — Telephone Encounter (Signed)
Called and spoke with patient who is calling because he would like work letter to be out of work until 09/19/2021. He states that he is supposed to the 6 minute walk on that day and then OV follow up on 09/28/21.   Dr. Francine Graven please advise

## 2021-09-07 NOTE — Telephone Encounter (Signed)
Patient returned call stated Ryan Glass already notified him that Rx ready for pick up.  This weekend had some shortness of breath with exertion walking up hill in [facility] had sp02 monitor in pocket checked reading and 77% return to greater than 90% RA after he was able to rest at top of hill and dyspnea resolved. Denied any worsening of palpitations/leg swelling recently.  Stopped carvedilol and verapamil as instructed and blood pressure his usual today and feeling well. Patient has follow up with cardiology Monday 3 Oct and pulmonology 6 minute walk test scheduled for 11 Oct.  Patient would like to return to work if cleared by cardiology Monday but still has pulmonology 6 minute walk test pending 11 Oct.  Disability insurance would require another 2 week wait (no pay) if cleared to return 3 Oct by cardiology but then pulmonology decides return to disability at 11 Oct appt.  Discussed with patient follow up with his pulmonologist regarding return to work determination will most likely be coordination between cardiology and pulmonology and not final until 11 Oct appt.  Patient spouse has started PT for concussion related balance issues and is still not cleared to drive.  Patient A&Ox3 spoke full sentences without difficulty respirations even and unlabored no cough/wheeze/shortness of breath audible during 10 minute telephone call.  Patient verbalized understanding information/instructions, agreed with plan of care and had no further questions at this time.

## 2021-09-07 NOTE — Telephone Encounter (Signed)
Follow up:    Patient wife stating that the nurse that called yesterday did not giver them a clear answer. Stating that he is ok. Patient would like to speak with some one.

## 2021-09-07 NOTE — Telephone Encounter (Signed)
Received request from Karin Golden refill singulair 10mg  po qhs #[id number] approved and electronically sent to patient pharmacy.  Telephone message left for patient refill sent to his pharmacy and to contact clinic staff to follow up cancellation of PFO closure yesterday by cardiology Dr .  Patient has follow up cardiology appt Monday 09/11/21.

## 2021-09-07 NOTE — Telephone Encounter (Signed)
Returned call to Pt.  Pt was concerned he had sent a transmission and had not heard back about the results.  Pt's PFO closure was delayed d/t frequent PVC's.  Request for device transmission was requested.  Per device clinic- heart rate range 40's-300's but mainly 60-90 BPM.  PVC burden 10% per device.  Pt has follow up with RU on Monday.  All Pt's questions answered.  He will ask RU about returning to work at appointment on Monday.

## 2021-09-08 ENCOUNTER — Inpatient Hospital Stay (HOSPITAL_COMMUNITY)
Admission: EM | Admit: 2021-09-08 | Discharge: 2021-09-12 | DRG: 309 | Disposition: A | Discharge status: Home / Self Care | Payer: No Typology Code available for payment source | Attending: Internal Medicine | Admitting: Internal Medicine

## 2021-09-08 ENCOUNTER — Encounter (HOSPITAL_COMMUNITY): Payer: Self-pay | Admitting: Cardiology

## 2021-09-08 ENCOUNTER — Emergency Department (HOSPITAL_COMMUNITY): Payer: No Typology Code available for payment source

## 2021-09-08 ENCOUNTER — Other Ambulatory Visit: Payer: Self-pay

## 2021-09-08 ENCOUNTER — Telehealth: Payer: Self-pay

## 2021-09-08 DIAGNOSIS — I7121 Aneurysm of the ascending aorta, without rupture: Secondary | ICD-10-CM | POA: Diagnosis present

## 2021-09-08 DIAGNOSIS — I480 Paroxysmal atrial fibrillation: Secondary | ICD-10-CM | POA: Diagnosis present

## 2021-09-08 DIAGNOSIS — Z7901 Long term (current) use of anticoagulants: Secondary | ICD-10-CM | POA: Diagnosis not present

## 2021-09-08 DIAGNOSIS — N182 Chronic kidney disease, stage 2 (mild): Secondary | ICD-10-CM | POA: Diagnosis present

## 2021-09-08 DIAGNOSIS — E876 Hypokalemia: Secondary | ICD-10-CM | POA: Diagnosis present

## 2021-09-08 DIAGNOSIS — M069 Rheumatoid arthritis, unspecified: Secondary | ICD-10-CM | POA: Diagnosis present

## 2021-09-08 DIAGNOSIS — I422 Other hypertrophic cardiomyopathy: Secondary | ICD-10-CM | POA: Diagnosis present

## 2021-09-08 DIAGNOSIS — Q2112 Patent foramen ovale: Secondary | ICD-10-CM | POA: Diagnosis not present

## 2021-09-08 DIAGNOSIS — I2510 Atherosclerotic heart disease of native coronary artery without angina pectoris: Secondary | ICD-10-CM | POA: Diagnosis present

## 2021-09-08 DIAGNOSIS — I447 Left bundle-branch block, unspecified: Secondary | ICD-10-CM | POA: Diagnosis present

## 2021-09-08 DIAGNOSIS — I421 Obstructive hypertrophic cardiomyopathy: Secondary | ICD-10-CM | POA: Diagnosis present

## 2021-09-08 DIAGNOSIS — Z7982 Long term (current) use of aspirin: Secondary | ICD-10-CM

## 2021-09-08 DIAGNOSIS — I472 Ventricular tachycardia, unspecified: Principal | ICD-10-CM

## 2021-09-08 DIAGNOSIS — Z20822 Contact with and (suspected) exposure to covid-19: Secondary | ICD-10-CM | POA: Diagnosis present

## 2021-09-08 DIAGNOSIS — Z79631 Long term (current) use of antimetabolite agent: Secondary | ICD-10-CM

## 2021-09-08 DIAGNOSIS — Z8249 Family history of ischemic heart disease and other diseases of the circulatory system: Secondary | ICD-10-CM | POA: Diagnosis not present

## 2021-09-08 DIAGNOSIS — G4733 Obstructive sleep apnea (adult) (pediatric): Secondary | ICD-10-CM | POA: Diagnosis present

## 2021-09-08 DIAGNOSIS — I5022 Chronic systolic (congestive) heart failure: Secondary | ICD-10-CM | POA: Diagnosis present

## 2021-09-08 DIAGNOSIS — I130 Hypertensive heart and chronic kidney disease with heart failure and stage 1 through stage 4 chronic kidney disease, or unspecified chronic kidney disease: Secondary | ICD-10-CM | POA: Diagnosis present

## 2021-09-08 DIAGNOSIS — R072 Precordial pain: Secondary | ICD-10-CM | POA: Diagnosis not present

## 2021-09-08 DIAGNOSIS — Z7952 Long term (current) use of systemic steroids: Secondary | ICD-10-CM

## 2021-09-08 DIAGNOSIS — Z888 Allergy status to other drugs, medicaments and biological substances status: Secondary | ICD-10-CM

## 2021-09-08 DIAGNOSIS — Z8616 Personal history of COVID-19: Secondary | ICD-10-CM

## 2021-09-08 DIAGNOSIS — Z95810 Presence of automatic (implantable) cardiac defibrillator: Secondary | ICD-10-CM

## 2021-09-08 DIAGNOSIS — R008 Other abnormalities of heart beat: Secondary | ICD-10-CM | POA: Diagnosis present

## 2021-09-08 DIAGNOSIS — M1A9XX0 Chronic gout, unspecified, without tophus (tophi): Secondary | ICD-10-CM | POA: Diagnosis present

## 2021-09-08 DIAGNOSIS — I4510 Unspecified right bundle-branch block: Secondary | ICD-10-CM | POA: Diagnosis present

## 2021-09-08 DIAGNOSIS — Z79899 Other long term (current) drug therapy: Secondary | ICD-10-CM

## 2021-09-08 DIAGNOSIS — Z7951 Long term (current) use of inhaled steroids: Secondary | ICD-10-CM | POA: Diagnosis not present

## 2021-09-08 DIAGNOSIS — Z886 Allergy status to analgesic agent status: Secondary | ICD-10-CM

## 2021-09-08 DIAGNOSIS — R002 Palpitations: Secondary | ICD-10-CM

## 2021-09-08 DIAGNOSIS — I10 Essential (primary) hypertension: Secondary | ICD-10-CM | POA: Diagnosis not present

## 2021-09-08 DIAGNOSIS — Z7969 Long term (current) use of other immunomodulators and immunosuppressants: Secondary | ICD-10-CM

## 2021-09-08 DIAGNOSIS — R079 Chest pain, unspecified: Secondary | ICD-10-CM

## 2021-09-08 LAB — CBC WITH DIFFERENTIAL/PLATELET
Abs Immature Granulocytes: 0.03 10*3/uL (ref 0.00–0.07)
Basophils Absolute: 0 10*3/uL (ref 0.0–0.1)
Basophils Relative: 1 %
Eosinophils Absolute: 0.1 10*3/uL (ref 0.0–0.5)
Eosinophils Relative: 2 %
HCT: 41.2 % (ref 39.0–52.0)
Hemoglobin: 14 g/dL (ref 13.0–17.0)
Immature Granulocytes: 1 %
Lymphocytes Relative: 19 %
Lymphs Abs: 1.2 10*3/uL (ref 0.7–4.0)
MCH: 34.5 pg — ABNORMAL HIGH (ref 26.0–34.0)
MCHC: 34 g/dL (ref 30.0–36.0)
MCV: 101.5 fL — ABNORMAL HIGH (ref 80.0–100.0)
Monocytes Absolute: 0.4 10*3/uL (ref 0.1–1.0)
Monocytes Relative: 7 %
Neutro Abs: 4.5 10*3/uL (ref 1.7–7.7)
Neutrophils Relative %: 70 %
Platelets: 142 10*3/uL — ABNORMAL LOW (ref 150–400)
RBC: 4.06 MIL/uL — ABNORMAL LOW (ref 4.22–5.81)
RDW: 13.9 % (ref 11.5–15.5)
WBC: 6.2 10*3/uL (ref 4.0–10.5)
nRBC: 0 % (ref 0.0–0.2)

## 2021-09-08 LAB — COMPREHENSIVE METABOLIC PANEL
ALT: 30 U/L (ref 0–44)
AST: 29 U/L (ref 15–41)
Albumin: 3.7 g/dL (ref 3.5–5.0)
Alkaline Phosphatase: 60 U/L (ref 38–126)
Anion gap: 8 (ref 5–15)
BUN: 18 mg/dL (ref 8–23)
CO2: 22 mmol/L (ref 22–32)
Calcium: 9.3 mg/dL (ref 8.9–10.3)
Chloride: 108 mmol/L (ref 98–111)
Creatinine, Ser: 1.25 mg/dL — ABNORMAL HIGH (ref 0.61–1.24)
GFR, Estimated: >60 mL/min (ref >60)
Glucose, Bld: 108 mg/dL — ABNORMAL HIGH (ref 70–99)
Potassium: 3.7 mmol/L (ref 3.5–5.1)
Sodium: 138 mmol/L (ref 135–145)
Total Bilirubin: 1 mg/dL (ref 0.3–1.2)
Total Protein: 5.8 g/dL — ABNORMAL LOW (ref 6.5–8.1)

## 2021-09-08 LAB — BRAIN NATRIURETIC PEPTIDE: B Natriuretic Peptide: 116.3 pg/mL — ABNORMAL HIGH (ref 0.0–100.0)

## 2021-09-08 LAB — TROPONIN I (HIGH SENSITIVITY)
Troponin I (High Sensitivity): 15 ng/L (ref <18)
Troponin I (High Sensitivity): 15 ng/L (ref <18)

## 2021-09-08 LAB — RESP PANEL BY RT-PCR (FLU A&B, COVID) ARPGX2
Influenza A by PCR: NEGATIVE
Influenza B by PCR: NEGATIVE
SARS Coronavirus 2 by RT PCR: NEGATIVE

## 2021-09-08 LAB — MAGNESIUM: Magnesium: 1.8 mg/dL (ref 1.7–2.4)

## 2021-09-08 IMAGING — DX DG CHEST 1V PORT
1 series · 1 of 1 positions shown · non-contrast
Comparison: Chest x-ray [DATE].

CLINICAL DATA: 65-year-old male with history of chest pain.
Ventricular tachycardia.

EXAM:
PORTABLE CHEST 1 VIEW

[chest ap]
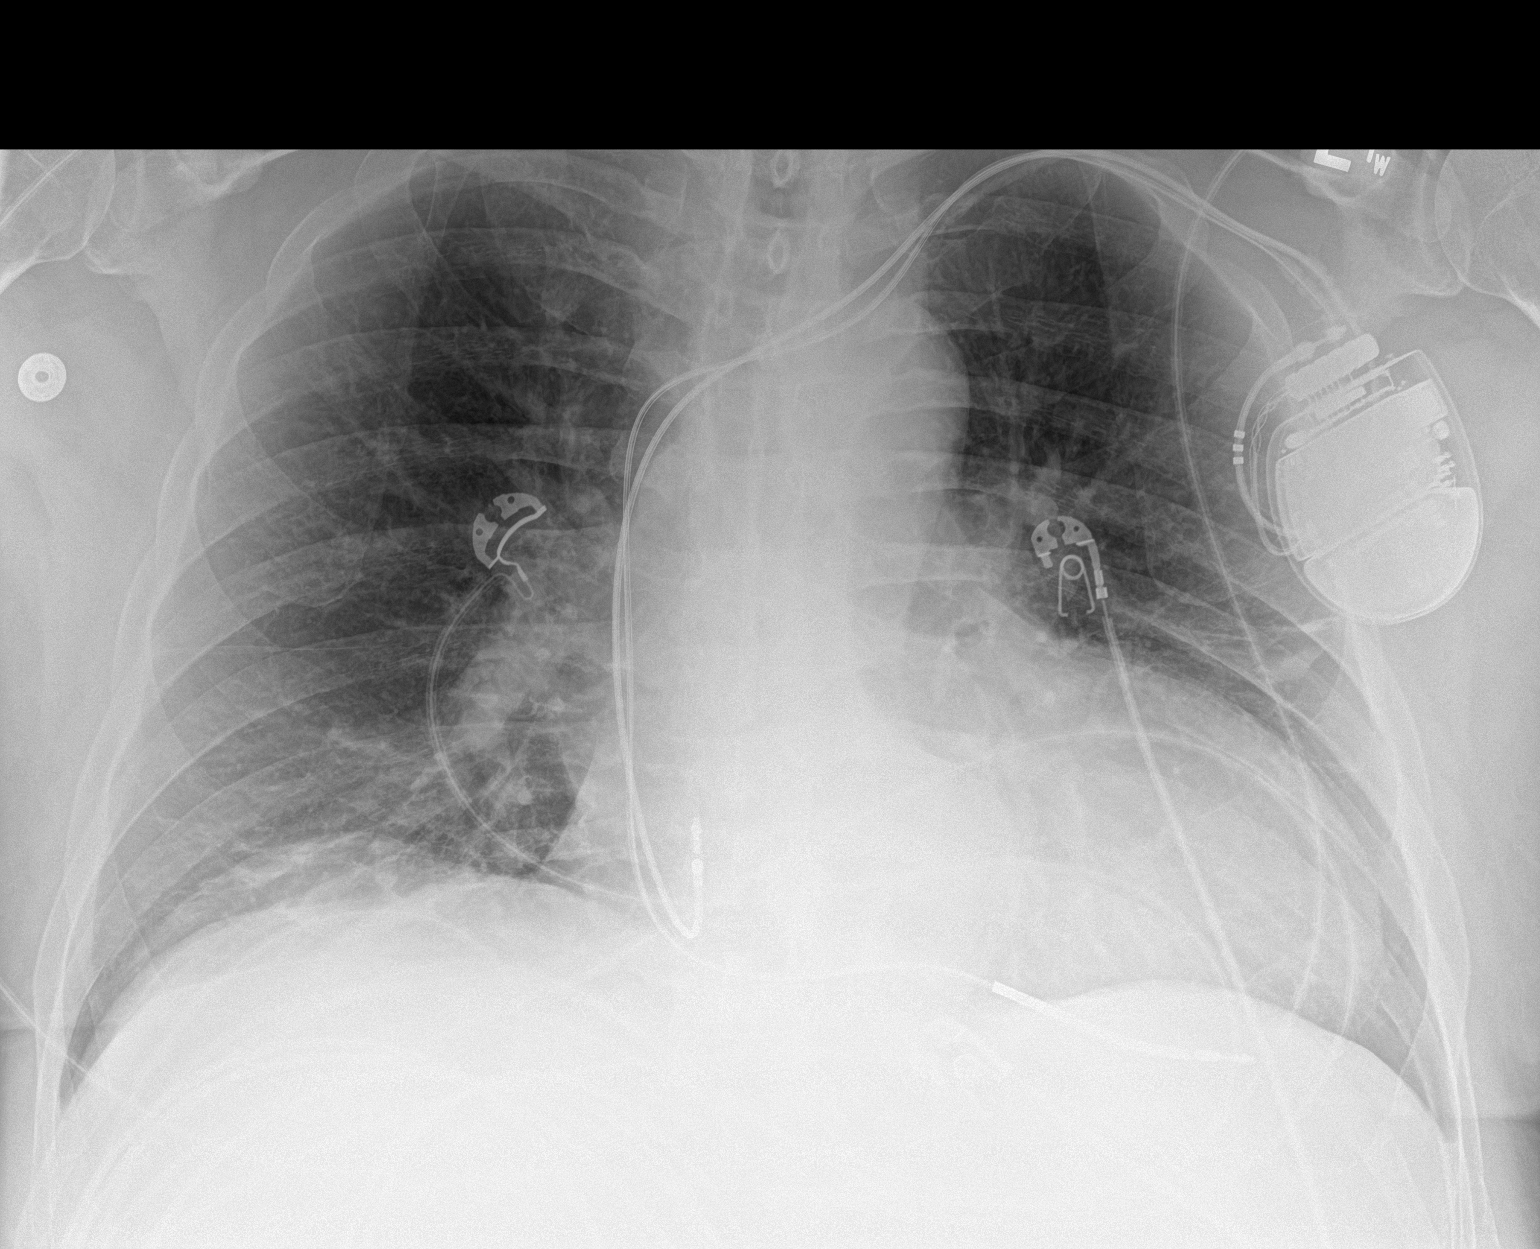

[1 of 1 positions shown; findings below may reference images not displayed]

FINDINGS: Linear areas of atelectasis are noted in the left mid lung and right
lung base. Lung volumes are low. No consolidative airspace disease.
No pleural effusions. No pneumothorax. No evidence of pulmonary
edema. Heart size is mildly enlarged. Upper mediastinal contours are
within normal limits. Left-sided pacemaker/AICD in place with lead
tips projecting over the expected location of the right atrium and
right ventricle.
IMPRESSION: 1. Low lung volumes with areas of subsegmental atelectasis in the
left mid lung and right lung base.
2. Mild cardiomegaly.

## 2021-09-08 MED ORDER — MONTELUKAST SODIUM 10 MG PO TABS
10.0000 mg | ORAL_TABLET | Freq: Every day | ORAL | Status: DC
  Administered 2021-09-08 – 2021-09-11 (×4): 10 mg via ORAL
  Filled 2021-09-08 (×4): qty 1

## 2021-09-08 MED ORDER — PREDNISONE 5 MG PO TABS
5.0000 mg | ORAL_TABLET | Freq: Every day | ORAL | Status: DC
  Administered 2021-09-09 – 2021-09-12 (×4): 5 mg via ORAL
  Filled 2021-09-08 (×4): qty 1

## 2021-09-08 MED ORDER — HYDRALAZINE HCL 25 MG PO TABS
50.0000 mg | ORAL_TABLET | Freq: Two times a day (BID) | ORAL | Status: DC
  Administered 2021-09-08 – 2021-09-09 (×2): 50 mg via ORAL
  Filled 2021-09-08 (×2): qty 2

## 2021-09-08 MED ORDER — SOTALOL HCL 80 MG PO TABS: 80.0000 mg | ORAL_TABLET | Freq: Two times a day (BID) | ORAL | Status: DC

## 2021-09-08 MED ORDER — ACETAMINOPHEN 325 MG PO TABS
650.0000 mg | ORAL_TABLET | ORAL | Status: DC | PRN
  Administered 2021-09-10 – 2021-09-12 (×6): 650 mg via ORAL
  Filled 2021-09-08 (×6): qty 2

## 2021-09-08 MED ORDER — POTASSIUM CHLORIDE CRYS ER 20 MEQ PO TBCR
40.0000 meq | EXTENDED_RELEASE_TABLET | Freq: Once | ORAL | Status: AC
  Administered 2021-09-08: 40 meq via ORAL
  Filled 2021-09-08: qty 2

## 2021-09-08 MED ORDER — FLUTICASONE PROPIONATE 50 MCG/ACT NA SUSP
1.0000 | Freq: Every day | NASAL | Status: DC
  Administered 2021-09-09 – 2021-09-12 (×4): 1 via NASAL
  Filled 2021-09-08: qty 16

## 2021-09-08 MED ORDER — SPIRONOLACTONE 12.5 MG HALF TABLET
12.5000 mg | ORAL_TABLET | Freq: Every day | ORAL | Status: DC
  Administered 2021-09-09: 12.5 mg via ORAL
  Filled 2021-09-08 (×2): qty 1

## 2021-09-08 MED ORDER — FLUTICASONE FUROATE-VILANTEROL 200-25 MCG/INH IN AEPB
1.0000 | INHALATION_SPRAY | Freq: Every day | RESPIRATORY_TRACT | Status: DC
  Administered 2021-09-09 – 2021-09-12 (×3): 1 via RESPIRATORY_TRACT
  Filled 2021-09-08: qty 28

## 2021-09-08 MED ORDER — CHLORTHALIDONE 25 MG PO TABS
25.0000 mg | ORAL_TABLET | Freq: Every day | ORAL | Status: DC
  Administered 2021-09-09 – 2021-09-12 (×4): 25 mg via ORAL
  Filled 2021-09-08 (×4): qty 1

## 2021-09-08 MED ORDER — METHOTREXATE 2.5 MG PO TABS
15.0000 mg | ORAL_TABLET | ORAL | Status: DC
Start: 2021-09-10 — End: 2021-09-08

## 2021-09-08 MED ORDER — APIXABAN 5 MG PO TABS
5.0000 mg | ORAL_TABLET | Freq: Two times a day (BID) | ORAL | Status: DC
  Administered 2021-09-08 – 2021-09-12 (×8): 5 mg via ORAL
  Filled 2021-09-08 (×8): qty 1

## 2021-09-08 MED ORDER — MAGNESIUM SULFATE 2 GM/50ML IV SOLN
2.0000 g | Freq: Once | INTRAVENOUS | Status: AC
  Administered 2021-09-08: 2 g via INTRAVENOUS
  Filled 2021-09-08: qty 50

## 2021-09-08 MED ORDER — ALBUTEROL SULFATE (2.5 MG/3ML) 0.083% IN NEBU: 3.0000 mL | INHALATION_SOLUTION | RESPIRATORY_TRACT | Status: DC | PRN

## 2021-09-08 MED ORDER — METHOTREXATE 2.5 MG PO TABS
15.0000 mg | ORAL_TABLET | ORAL | Status: DC
  Administered 2021-09-10: 15 mg via ORAL
  Filled 2021-09-08: qty 6

## 2021-09-08 MED ORDER — ATORVASTATIN CALCIUM 40 MG PO TABS
40.0000 mg | ORAL_TABLET | Freq: Every day | ORAL | Status: DC
  Administered 2021-09-09 – 2021-09-11 (×3): 40 mg via ORAL
  Filled 2021-09-08 (×3): qty 1

## 2021-09-08 MED ORDER — SODIUM CHLORIDE 0.9% FLUSH
3.0000 mL | Freq: Two times a day (BID) | INTRAVENOUS | Status: DC
Start: 2021-09-08 — End: 2021-09-12
  Administered 2021-09-08 – 2021-09-12 (×9): 3 mL via INTRAVENOUS

## 2021-09-08 MED ORDER — LOSARTAN POTASSIUM 50 MG PO TABS
100.0000 mg | ORAL_TABLET | Freq: Every day | ORAL | Status: DC
  Administered 2021-09-09 – 2021-09-12 (×4): 100 mg via ORAL
  Filled 2021-09-08 (×5): qty 2

## 2021-09-08 MED ORDER — SODIUM CHLORIDE 0.9 % IV SOLN: 250.0000 mL | INTRAVENOUS | Status: DC | PRN

## 2021-09-08 MED ORDER — POTASSIUM CHLORIDE CRYS ER 20 MEQ PO TBCR
20.0000 meq | EXTENDED_RELEASE_TABLET | Freq: Two times a day (BID) | ORAL | Status: DC
  Administered 2021-09-09 (×2): 20 meq via ORAL
  Filled 2021-09-08 (×3): qty 1

## 2021-09-08 MED ORDER — SOTALOL HCL 80 MG PO TABS
80.0000 mg | ORAL_TABLET | Freq: Two times a day (BID) | ORAL | Status: DC
Start: 2021-09-09 — End: 2021-09-09
  Filled 2021-09-08: qty 1

## 2021-09-08 MED ORDER — SODIUM CHLORIDE 0.9% FLUSH: 3.0000 mL | INTRAVENOUS | Status: DC | PRN

## 2021-09-08 MED ORDER — POTASSIUM CHLORIDE ER 20 MEQ PO TBCR: 20.0000 meq | EXTENDED_RELEASE_TABLET | Freq: Two times a day (BID) | ORAL | Status: DC

## 2021-09-08 MED ORDER — GABAPENTIN 600 MG PO TABS
600.0000 mg | ORAL_TABLET | Freq: Three times a day (TID) | ORAL | Status: DC
  Administered 2021-09-08 – 2021-09-12 (×12): 600 mg via ORAL
  Filled 2021-09-08 (×12): qty 1

## 2021-09-08 NOTE — ED Triage Notes (Signed)
Pt bib ems from home with reports of a burning chest pain. Pt in bigeminy with ems. AICD in place, pt reports he has been in VT 9 times since yesterday with no shocks delivered. Pt in NAD.

## 2021-09-08 NOTE — H&P (Signed)
H&P   Patient ID: Ryan Glass MRN: [id number]; DOB: 03/26/1956  Admit date: 09/08/2021 Date of Consult: 09/08/2021  PCP:  [name], MD   [facility] Providers Cardiologist:  [name], MD  Electrophysiologist:  [name], MD    Patient Profile:   Ryan Glass is a 65 y.o. male with a hx of with a hx of PVCs (ablated 2019 by Dr. Ladona Ridgel) with recurrence since then, RBBB, TAA, CKD (II), HTN, RA(chronic therapy with Humira, methotrexate, prednisone), OSA w/CPAP, June 2022 found with small PE,  VT, HCM, AFib who is being seen 09/08/2021 for the evaluation of VT at the request of Dr. Rush Landmark.  History of Present Illness:   [name] recently admitted 08/03/21 for sustained MMVT and new onset Afib found on an out patient monitor, w/u noted c.MRI findings abnormal c/w HCM underwent ICD implant and medicines adjusted discharged 08/08/21.  He saw Dr. Excell Seltzer 08/24/21 for consideration of PFO closure, though with subsequent visits with pulmonology his did not desaturate with ambulation and in discussion previously with dr. Excell Seltzer, no plans for closure feeling that his SOB not associated with his PFO and more likely 2/2 PVCs/ectopy.   Today the office was alerted for VT episodes and ATP therapies, pt reported feeling some symptoms of some chest burning and advised to go to the hospital.  The patient reports that in  general tends to not feel perfect though yesterday AM was a better day initially he did eventually feels somewhat waxing/waning burning in his chest, perhaps associated with weakness, not particularly any change in SOB No near syncope or syncope.  He was about to eat breakfast this Am when the office called him to make him aware of his device transmission he was a little surprised that he had been treated since he had not felt anything. He was nervous and mentioned some burning in his chest but otherwise felt like he was ok. He and his wife became very worried and  called 911  LABS had to be re-drawn are still pending  ICD information/interrogation Implanted 08/07/21 Abbott dual chamber device  Transmission from yesterday Device measurements are ok 18%AP, 21%VP <1% AF burden  Presenting AS/VS, PVCs 6 total ATP therapies, 7 episodes 5 episodes required 2 ATP schemes to convert VT 163 -200bpm     Past Medical History:  Diagnosis Date   Ascending aortic aneurysm (HCC)    a. CT 05/2021 showed 4.4x4.4cm TAA.   Bilateral lower extremity edema    Chronic gout without tophus    followed by dr Sharmon Revere    (06-08-2020  per pt last episode right knee 3 wks ago)   CKD (chronic kidney disease), stage II    nephrology--- Virgina Norfolk PA (10-29-2019 note in epic scanned in  media)   Heart failure with mid-range ejection fraction [facility]) 05/27/2021   05/26/21: Left ventricular ejection fraction, by estimation, is 45 to 50%. There is severe asymmetric left ventricular hypertrophy. G1DD present.    History of COVID-19 06/07/2021   HOCM (hypertrophic obstructive cardiomyopathy) (HCC)    s/p ICD 07/2021   Hypertension    followed by cardiology, dr t. turner   (05-14-2018 nuclear study in epic , normal perfusion with nuclear ef 61%)   Left hydrocele    Low serum potassium level 04/27/2014   OSA on CPAP    per pt uses every night   Palpitations followed by dr t. Mayford Knife   06-08-2020  still feels palipations due to PVCs when  exertion but not with chest pain/ discomfort   Pneumonia due to COVID-19 virus    PVC's (premature ventricular contractions) cardiologist--- dr t. turner   Status post PVC ablation by Dr. Ladona Ridgel 2019 with recurrence of frequent PVCs/bigeminy;  prior pseudobradycardia r/t pvcs   Rheumatoid arthritis involving multiple sites [facility])    rheumotology--- dr a. Sharmon Revere  ([facility] in [place])    Past Surgical History:  Procedure Laterality Date   BUBBLE STUDY  08/18/2021   Procedure: BUBBLE STUDY;  Surgeon: Wendall Stade, MD;  Location: [facility]  ENDOSCOPY;  Service: Cardiovascular;;   HYDROCELE EXCISION Left 06/14/2020   Procedure: LEFT  HYDROCELECTOMY ADULT;  Surgeon: Noel Christmas, MD;  Location: [facility];  Service: Urology;  Laterality: Left;   ICD IMPLANT N/A 08/07/2021   Procedure: ICD IMPLANT;  Surgeon: Marinus Maw, MD;  Location: [facility] INVASIVE CV LAB;  Service: Cardiovascular;  Laterality: N/A;   INCISIONAL HERNIA REPAIR  02-23-2016   @[facility]    LAPAROSCOPIC   LAPAROSCOPIC INGUINAL HERNIA REPAIR Bilateral 08-22-2015  @[facility]    AND UMBILICAL HERNIA REPAIR   PVC ABLATION N/A 10/07/2018   Procedure: PVC ABLATION;  Surgeon: , MD;  Location: [facility] INVASIVE CV LAB;  Service: Cardiovascular;  Laterality: N/A;   RIGHT/LEFT HEART CATH AND CORONARY ANGIOGRAPHY N/A 05/29/2021   Procedure: RIGHT/LEFT HEART CATH AND CORONARY ANGIOGRAPHY;  Surgeon: Marinus Maw, MD;  Location: [facility] INVASIVE CV LAB;  Service: Cardiovascular;  Laterality: N/A;   TEE WITHOUT CARDIOVERSION N/A 08/18/2021   Procedure: TRANSESOPHAGEAL ECHOCARDIOGRAM (TEE);  Surgeon: Kathleene Hazel, MD;  Location: [facility] ENDOSCOPY;  Service: Cardiovascular;  Laterality: N/A;   UMBILICAL HERNIA REPAIR  child     Home Medications:  Prior to Admission medications   Medication Sig Start Date End Date Taking? Authorizing Provider  acetaminophen-codeine (TYLENOL #3) 300-30 MG tablet Take 1-2 tablets by mouth every 12 (twelve) hours as needed for moderate pain. 05/06/19   [provider]  albuterol (VENTOLIN HFA) 108 (90 Base) MCG/ACT inhaler Inhale 2 puffs into the lungs every 4 (four) hours as needed for wheezing or shortness of breath. 03/02/21   Betancourt, 05/08/19, NP  apixaban (ELIQUIS) 5 MG TABS tablet Take 1 tablet (5 mg total) by mouth 2 (two) times daily. 06/17/21   Parrett, Jarold Song, NP  aspirin EC 81 MG tablet Take 1 tablet (81 mg total) by mouth daily. Swallow whole. 08/24/21   Virgel Bouquet, MD  atorvastatin (LIPITOR) 40 MG tablet Take 1  tablet (40 mg total) by mouth daily. 01/09/21   Tonny Bollman, MD  carvedilol (COREG) 6.25 MG tablet Take 1 tablet (6.25 mg total) by mouth 2 (two) times daily with a meal. 08/08/21   Quintella Reichert, PA-C  chlorthalidone (HYGROTON) 25 MG tablet Take 25 mg by mouth daily. 04/02/21   [provider]  colchicine 0.6 MG tablet Take 1-2 tablets by mouth daily as needed (gout).  01/31/16   [provider]  fluticasone (FLONASE) 50 MCG/ACT nasal spray Place 1 spray into both nostrils daily. Patient taking differently: Place 2 sprays into both nostrils at bedtime. 06/08/21   Lilland, Alana, DO  Fluticasone-Salmeterol (ADVAIR) 250-50 MCG/DOSE AEPB Inhale 1 puff into the lungs every 12 (twelve) hours. 11/28/20   06/10/21, MD  folic acid (FOLVITE) 1 MG tablet Take 1 mg by mouth daily. 01/29/20   [provider]  furosemide (LASIX) 40 MG tablet Take 1 tablet (40 mg total) by mouth as  needed. Take 1 tablet by mouth daily for 3 days, then decrease to only as needed for increase weight gain 07/11/21   Dyann Kief, PA-C  gabapentin (NEURONTIN) 600 MG tablet Take 600 mg by mouth 3 (three) times daily.  02/04/19   [provider]  HUMIRA PEN 40 MG/0.4ML PNKT Inject 40 mg as directed every 14 (fourteen) days. 04/09/21   [provider]  hydrALAZINE (APRESOLINE) 50 MG tablet Take 1 tablet (50 mg total) by mouth 3 (three) times daily. Patient taking differently: Take 50 mg by mouth 2 (two) times daily. 08/15/21   Quintella Reichert, MD  losartan (COZAAR) 100 MG tablet Take 1 tablet (100 mg total) by mouth daily. 06/14/21   Quintella Reichert, MD  methotrexate 2.5 MG tablet Take 15 mg by mouth every Sunday. 06/22/20   [provider]  montelukast (SINGULAIR) 10 MG tablet Take 1 tablet (10 mg total) by mouth at bedtime. 09/07/21   Betancourt, Jarold Song, NP  nebivolol (BYSTOLIC) 5 MG tablet Take by mouth. 09/01/21   [provider]  Potassium Chloride ER 20 MEQ TBCR  Take 20 mEq by mouth in the morning and at bedtime. 06/08/21   Lilland, Alana, DO  predniSONE (DELTASONE) 5 MG tablet Take 5 mg by mouth daily. 01/29/20   [provider]  sildenafil (REVATIO) 20 MG tablet Take 20 mg by mouth daily as needed (erectile dysfunction). 08/03/20   [provider]  spironolactone (ALDACTONE) 25 MG tablet Take 0.5 tablets (12.5 mg total) by mouth daily. 08/09/21   Betancourt, Jarold Song, NP  verapamil (CALAN-SR) 180 MG CR tablet Take 1 tablet (180 mg total) by mouth daily. 08/09/21   Sheilah Pigeon, PA-C    Inpatient Medications: Scheduled Meds:  Continuous Infusions:  PRN Meds:   Allergies:    Allergies  Allergen Reactions   Amlodipine Besylate Swelling    Leg swelling with 10mg  daily am dosing; decreased with BID 5mg  dosing   Aspirin Itching    Social History:   Social History   Socioeconomic History   Marital status: Married    Spouse name: Not on file   Number of children: Not on file   Years of education: Not on file   Highest education level: Not on file  Occupational History   Not on file  Tobacco Use   Smoking status: Never   Smokeless tobacco: Never  Vaping Use   Vaping Use: Never used  Substance and Sexual Activity   Alcohol use: Yes    Alcohol/week: 5.0 - 7.0 standard drinks    Types: 5 - 7 Cans of beer per week   Drug use: Never   Sexual activity: Not on file  Other Topics Concern   Not on file  Social History Narrative   Not on file   Social Determinants of Health   Financial Resource Strain: Not on file  Food Insecurity: Not on file  Transportation Needs: Not on file  Physical Activity: Not on file  Stress: Not on file  Social Connections: Not on file  Intimate Partner Violence: Not on file    Family History:   Family History  Problem Relation Age of Onset   Cardiomyopathy Mother    Heart attack Father      ROS:  Please see the history of present illness.  All other ROS reviewed and negative.      Physical Exam/Data:   Vitals:   09/08/21 1145 09/08/21 1200 09/08/21 1315  BP: 09/10/21)  148/98 (!) 140/106 (!) 142/79  Pulse: (!) 40 64 (!) 42  Resp: (!) 23 (!) 23 (!) 24  SpO2: 96% 96% 92%   No intake or output data in the 24 hours ending 09/08/21 1336 Last 3 Weights 09/05/2021 08/24/2021 08/18/2021  Weight (lbs) 254 lb 3.2 oz 251 lb 12.8 oz 252 lb  Weight (kg) 115.304 kg 114.216 kg 114.306 kg     There is no height or weight on file to calculate BMI.  General:  Well nourished, well developed, in no acute distress HEENT: normal Neck: no JVD Vascular: No carotid bruits; Distal pulses 2+ bilaterally Cardiac:  RRR; extrasystoles, no murmurs, gallops or rubs Lungs:  CTA b/l, no wheezing, rhonchi or rales  Abd: soft, nontender, no hepatomegaly  Ext: no edema Musculoskeletal:  No deformities Skin: warm and dry  Neuro:  no focal abnormalities noted Psych:  Normal affect, a little emotional ICD site is well healed, no fluctuation or erythema  EKG:  The EKG was personally reviewed and demonstrates:    SR 91bpm, PVCs, couplets  Telemetry:  Telemetry was personally reviewed and demonstrates:   SR 80's, very frequent PVCs, often couplets, rare 3-4beat NSVT, bursts of AIVR  Relevant CV Studies:  08/04/21; c.MRI IMPRESSION: 1. Asymmetric LV hypertrophy measuring 48mm in basal septum (4mm in basal lateral wall). This meets criteria for hypertrophic cardiomyopathy   2. Patchy late gadolinium enhancement at RV insertion site and basal lateral wall. LGE accounts for 1% of total myocardial mass. While this LGE pattern is consistent with HCM, would also consider Fabry disease as it can present with basal lateral LGE and LVH. Would recommend checking alpha-galactosidase A level   3. Normal LV size and systolic function (EF 52%), though was difficult to quantify volumes/EF due to significant motion artifact during acquisition   4.  Normal RV size with mild systolic dysfunction (EF 45%)    5.  Small pericardial effusion   6.  Dilated ascending aorta measuring 53mm     08/03/21: TTE IMPRESSIONS   1. Left ventricular ejection fraction, by estimation, is 60 to 65%. The  left ventricle has normal function. The left ventricle has no regional  wall motion abnormalities. The left ventricular internal cavity size was  mildly dilated. There is mild left  ventricular hypertrophy. Left ventricular diastolic parameters are  consistent with Grade I diastolic dysfunction (impaired relaxation).   2. Right ventricular systolic function is mildly reduced. The right  ventricular size is mildly enlarged.   3. Left atrial size was mildly dilated.   4. The mitral valve is normal in structure. No evidence of mitral valve  regurgitation. No evidence of mitral stenosis.   5. The aortic valve is tricuspid. Aortic valve regurgitation is not  visualized. Mild aortic valve sclerosis is present, with no evidence of  aortic valve stenosis.   6. Aortic dilatation noted. There is moderate dilatation of the aortic  root, measuring 46 mm. There is mild dilatation of the ascending aorta,  measuring 40 mm.   7. Agitated saline contrast bubble study was positive with shunting  observed within 3-6 cardiac cycles suggestive of interatrial shunt.      Monitor 8/7-15/2022 Predominant rhythm was normal sinus rhythm with average heart rate 65bpm and ranged from 42 to 119bpm. Frequent PVCs, ventricular couplets, bigminal PVCs - PVC load 12.6%. Occasional PACs and atrial couplets and triplets Ventricular tachycardia lasting 5 minutes and 11 seconds at 184bpm. Possible atrial fibrillation for 9 minutes and 42 seconds Patient was  symptomatic with afib and VT.      05/29/21: R/LHC Prox RCA lesion is 20% stenosed. Prox Cx to Mid Cx lesion is 20% stenosed. Mid LAD lesion is 30% stenosed.   Mild non-obstructive CAD Normal right and left heart pressures   Recommendations: Medical management of mild CAD. Will  resume IV heparin 6 hours post sheath pull given acute PE. Would transition to Eliquis or Xarelto tomorrow     NOTE by Dr. Mayford Knife 05/29/21 reads: "His SOB is far out of proportion to his very small PE.  Minimal CAD on cath and normal filling pressures on RHC with no pulmonary HTN.  RAP , PAP 25/51mmHg with mean PAP , mean PCWP and LVEDP and therefore not volume overloaded.    I personally reviewed the echo images and I feel that his RV is mildly dilated but RVF is normal and LVF is also normal.  I do not think his SOB is cardiac related.  Agree with placing on DOAC for PE.  Recommend Pulmonary consult"   05/26/21: TTE (done during hospitalization for acute PE) IMPRESSIONS   1. Left ventricular ejection fraction, by estimation, is 45 to 50%. The  left ventricle has mildly decreased function. The left ventricle has no  regional wall motion abnormalities. There is severe asymmetric left  ventricular hypertrophy. Left ventricular   diastolic parameters are consistent with Grade I diastolic dysfunction  (impaired relaxation).   2. Right ventricular systolic function is moderately reduced. The right  ventricular size is moderately enlarged. There is moderately elevated  pulmonary artery systolic pressure.   3. Left atrial size was moderately dilated.   4. Right atrial size was mildly dilated.   5. The mitral valve is normal in structure. Mild mitral valve  regurgitation.   6. The aortic valve is normal in structure. Aortic valve regurgitation is  not visualized. No aortic stenosis is present.   7. Aortic dilatation noted. There is mild dilatation of the ascending  aorta, measuring 41 mm.        02/08/21: TTE  1. Left ventricular ejection fraction, by estimation, is 60 to 65%. The  left ventricle has normal function. The left ventricle has no regional  wall motion abnormalities. There is moderate left ventricular hypertrophy.  Left ventricular diastolic  parameters are  indeterminate.   2. Right ventricular systolic function is normal. The right ventricular  size is mildly enlarged. Tricuspid regurgitation signal is inadequate for  assessing PA pressure.   3. Left atrial size was moderately dilated.   4. Right atrial size was moderately dilated.   5. The mitral valve is normal in structure. Trivial mitral valve  regurgitation.   6. The aortic valve is tricuspid. Aortic valve regurgitation is not  visualized. No aortic stenosis is present.   7. Aortic dilatation noted. Aneurysm of the aortic root, measuring 46 mm.  There is dilatation of the ascending aorta, measuring 41 mm.   8. The inferior vena cava is normal in size with greater than 50%  respiratory variability, suggesting right atrial pressure of 3 mmHg.    10/07/2018: EPS/Ablation (Dr. Ladona Ridgel) Conclusion: Successful EP study and catheter ablation of symptomatic PVCs originating from the RV inflow tract.  Successful ablation occurred just below the AV node and just apical of the His bundle region.  During RF energy application at the successful site, there was transient accelerated junctional rhythm.  However no heart block was demonstrated  Laboratory Data:  High Sensitivity Troponin:  No results for  input(s): TROPONINIHS in the last 720 hours.   ChemistryNo results for input(s): NA, K, CL, CO2, GLUCOSE, BUN, CREATININE, CALCIUM, MG, GFRNONAA, GFRAA, ANIONGAP in the last 168 hours.  No results for input(s): PROT, ALBUMIN, AST, ALT, ALKPHOS, BILITOT in the last 168 hours. Lipids No results for input(s): CHOL, TRIG, HDL, LABVLDL, LDLCALC, CHOLHDL in the last 168 hours.  HematologyNo results for input(s): WBC, RBC, HGB, HCT, MCV, MCH, MCHC, RDW, PLT in the last 168 hours. Thyroid No results for input(s): TSH, FREET4 in the last 168 hours.  BNPNo results for input(s): BNP, PROBNP in the last 168 hours.  DDimer No results for input(s): DDIMER in the last 168 hours.   Radiology/Studies:  DG Chest  Portable 1 View  Result Date: 09/08/2021 CLINICAL DATA:  65 year old male with history of chest pain. Ventricular tachycardia. EXAM: PORTABLE CHEST 1 VIEW COMPARISON:  Chest x-ray 08/08/2021. FINDINGS: Linear areas of atelectasis are noted in the left mid lung and right lung base. Lung volumes are low. No consolidative airspace disease. No pleural effusions. No pneumothorax. No evidence of pulmonary edema. Heart size is mildly enlarged. Upper mediastinal contours are within normal limits. Left-sided pacemaker/AICD in place with lead tips projecting over the expected location of the right atrium and right ventricle. IMPRESSION: 1. Low lung volumes with areas of subsegmental atelectasis in the left mid lung and right lung base. 2. Mild cardiomegaly. Electronically Signed   By: Trudie Reed M.D.   On: 09/08/2021 12:40     Assessment and Plan:   VT HCM 7 episodes, 5 required 2 spins of ATP He has very frequent PVCs, often with couplets as well as some AIVR 60's-70's DOE in last few months perhaps 2/2 this as well  Will admit with plans for sotalol pending his labs Not volume OL by exam, CXR, BNP  Paroxysmal Afib CHA2DS2Vasc is 5, on Eliquis  Hx of PE On Eliquis  OSA W/CPAP  HTN Home meds  RA Home meds   Risk Assessment/Risk Scores:    For questions or updates, please contact [facility] Please consult [email or web address] for contact info under    Signed, Sheilah Pigeon, PA-C  09/08/2021 1:36 PM

## 2021-09-08 NOTE — Telephone Encounter (Signed)
Merlin alert for NSVT, VT-1 and VT-2 with successful ATP therapy. There are 6 NSVT, 1 VT-1, and 6 VT-2 episodes 9/29 EGM's show sustained VT with successful ATP therapy Pt has f/u scheulded for Monday Oct 3rd. Route to triage. LR  Successful telephone call to patient to follow up on s/s of VT/ ATP therapies and med compliance. EMR reviewed. Patient states he is not feeling well and having a "burning" in his chest. Informed patient of remote monitor findings and advised patient to call 911. Patient hesitant however wife states she will call for patient. Patient advised to go to [facility] ED. [employer] Barrister's clerk notified that patient would be presenting to ED.

## 2021-09-08 NOTE — ED Provider Notes (Signed)
[facility] EMERGENCY DEPARTMENT Provider Note   CSN: [id number] Arrival date & time: 09/08/21  1135     History Chief Complaint  Patient presents with   Chest Pain    Ryan Glass is a 65 y.o. male.  The history is provided by the patient and medical records. No language interpreter was used.  Chest Pain Pain location:  Substernal area Pain quality: aching and burning   Pain radiates to:  Does not radiate Pain severity:  Moderate Onset quality:  Unable to specify Duration:  3 minutes Timing:  Sporadic Progression:  Waxing and waning Chronicity:  Recurrent Relieved by:  Nothing Worsened by:  Nothing Ineffective treatments:  None tried Associated symptoms: fatigue (lightheaded epispdes) and palpitations   Associated symptoms: no abdominal pain, no back pain, no cough, no diaphoresis, no fever, no headache, no nausea, no numbness, no shortness of breath and no vomiting       Past Medical History:  Diagnosis Date   Ascending aortic aneurysm (HCC)    a. CT 05/2021 showed 4.4x4.4cm TAA.   Bilateral lower extremity edema    Chronic gout without tophus    followed by dr Sharmon Revere    (06-08-2020  per pt last episode right knee 3 wks ago)   CKD (chronic kidney disease), stage II    nephrology--- Virgina Norfolk PA (10-29-2019 note in epic scanned in  media)   Heart failure with mid-range ejection fraction [facility]) 05/27/2021   05/26/21: Left ventricular ejection fraction, by estimation, is 45 to 50%. There is severe asymmetric left ventricular hypertrophy. G1DD present.    History of COVID-19 06/07/2021   HOCM (hypertrophic obstructive cardiomyopathy) (HCC)    s/p ICD 07/2021   Hypertension    followed by cardiology, dr t. turner   (05-14-2018 nuclear study in epic , normal perfusion with nuclear ef 61%)   Left hydrocele    Low serum potassium level 04/27/2014   OSA on CPAP    per pt uses every night   Palpitations followed by dr t. Mayford Knife   06-08-2020   still feels palipations due to PVCs when exertion but not with chest pain/ discomfort   Pneumonia due to COVID-19 virus    PVC's (premature ventricular contractions) cardiologist--- dr t. turner   Status post PVC ablation by Dr. Ladona Ridgel 2019 with recurrence of frequent PVCs/bigeminy;  prior pseudobradycardia r/t pvcs   Rheumatoid arthritis involving multiple sites [facility])    rheumotology--- dr a. Sharmon Revere  ([facility] in [place])    Patient Active Problem List   Diagnosis Date Noted   HOCM (hypertrophic obstructive cardiomyopathy) (HCC) 08/15/2021   Ventricular tachycardia (HCC) 08/02/2021   Acute on chronic respiratory failure (HCC) 06/07/2021   History of COVID-19 06/07/2021   Rheumatoid arthritis involving multiple sites on Humira (HCC) 06/07/2021   On chronic prednisone therapy for Rheumatoid Arthritis 06/07/2021   CAD (coronary artery disease), non-obstructive 05/30/2021   Elevated troponin    Precordial chest pain    Heart failure with mid-range ejection fraction (HCC) 05/27/2021   LVH (left ventricular hypertrophy) due to hypertensive disease, with heart failure (HCC) 05/27/2021   Class 2 severe obesity with serious comorbidity and body mass index (BMI) of 35.0 to 35.9 in adult [facility]) 05/26/2021   Pulmonary embolism (HCC) 05/25/2021   Dyspnea on exertion    Thoracic aortic aneurysm without rupture (HCC) 02/01/2021   Ascending aortic aneurysm (HCC) 02/01/2021   Other hyperlipidemia 10/14/2020   Testicular swelling 05/14/2019   Pain in right knee  01/29/2019   PVC's (premature ventricular contractions)    PVC (premature ventricular contraction) 10/07/2018   Dilated aortic root (HCC)    Allergic rhinitis 01/10/2016   Chronic gout without tophus 01/10/2016   Drug therapy 01/10/2016   OSA (obstructive sleep apnea) 01/10/2016   Colonic polyp 12/14/2015   Rt groin pain 12/14/2015   Scoliosis of thoracolumbar spine 12/14/2015   Right lower quadrant abdominal pain 04/20/2015   Arthritis of  knee 02/22/2015   Edema of extremities 02/22/2015   Essential hypertension 02/22/2015   Preventative health care 02/22/2015   Bradycardia by electrocardiogram 04/16/2014   Chest pain 04/16/2014    Past Surgical History:  Procedure Laterality Date   BUBBLE STUDY  08/18/2021   Procedure: BUBBLE STUDY;  Surgeon: Wendall Stade, MD;  Location: [facility] ENDOSCOPY;  Service: Cardiovascular;;   HYDROCELE EXCISION Left 06/14/2020   Procedure: LEFT  HYDROCELECTOMY ADULT;  Surgeon: Noel Christmas, MD;  Location: [facility];  Service: Urology;  Laterality: Left;   ICD IMPLANT N/A 08/07/2021   Procedure: ICD IMPLANT;  Surgeon: Marinus Maw, MD;  Location: [facility] INVASIVE CV LAB;  Service: Cardiovascular;  Laterality: N/A;   INCISIONAL HERNIA REPAIR  02-23-2016   @[facility]    LAPAROSCOPIC   LAPAROSCOPIC INGUINAL HERNIA REPAIR Bilateral 08-22-2015  @[facility]    AND UMBILICAL HERNIA REPAIR   PVC ABLATION N/A 10/07/2018   Procedure: PVC ABLATION;  Surgeon: , MD;  Location: [facility] INVASIVE CV LAB;  Service: Cardiovascular;  Laterality: N/A;   RIGHT/LEFT HEART CATH AND CORONARY ANGIOGRAPHY N/A 05/29/2021   Procedure: RIGHT/LEFT HEART CATH AND CORONARY ANGIOGRAPHY;  Surgeon: Marinus Maw, MD;  Location: [facility] INVASIVE CV LAB;  Service: Cardiovascular;  Laterality: N/A;   TEE WITHOUT CARDIOVERSION N/A 08/18/2021   Procedure: TRANSESOPHAGEAL ECHOCARDIOGRAM (TEE);  Surgeon: Kathleene Hazel, MD;  Location: [facility] ENDOSCOPY;  Service: Cardiovascular;  Laterality: N/A;   UMBILICAL HERNIA REPAIR  child       Family History  Problem Relation Age of Onset   Cardiomyopathy Mother    Heart attack Father     Social History   Tobacco Use   Smoking status: Never   Smokeless tobacco: Never  Vaping Use   Vaping Use: Never used  Substance Use Topics   Alcohol use: Yes    Alcohol/week: 5.0 - 7.0 standard drinks    Types: 5 - 7 Cans of beer per week   Drug use: Never    Home Medications Prior  to Admission medications   Medication Sig Start Date End Date Taking? Authorizing Provider  acetaminophen-codeine (TYLENOL #3) 300-30 MG tablet Take 1-2 tablets by mouth every 12 (twelve) hours as needed for moderate pain. 05/06/19   [provider]  albuterol (VENTOLIN HFA) 108 (90 Base) MCG/ACT inhaler Inhale 2 puffs into the lungs every 4 (four) hours as needed for wheezing or shortness of breath. 03/02/21   Betancourt, 05/08/19, NP  apixaban (ELIQUIS) 5 MG TABS tablet Take 1 tablet (5 mg total) by mouth 2 (two) times daily. 06/17/21   Parrett, Jarold Song, NP  aspirin EC 81 MG tablet Take 1 tablet (81 mg total) by mouth daily. Swallow whole. 08/24/21   Virgel Bouquet, MD  atorvastatin (LIPITOR) 40 MG tablet Take 1 tablet (40 mg total) by mouth daily. 01/09/21   Tonny Bollman, MD  carvedilol (COREG) 6.25 MG tablet Take 1 tablet (6.25 mg total) by mouth 2 (two) times daily with a meal. 08/08/21   Quintella Reichert,  PA-C  chlorthalidone (HYGROTON) 25 MG tablet Take 25 mg by mouth daily. 04/02/21   [provider]  colchicine 0.6 MG tablet Take 1-2 tablets by mouth daily as needed (gout).  01/31/16   [provider]  fluticasone (FLONASE) 50 MCG/ACT nasal spray Place 1 spray into both nostrils daily. Patient taking differently: Place 2 sprays into both nostrils at bedtime. 06/08/21   Lilland, Alana, DO  Fluticasone-Salmeterol (ADVAIR) 250-50 MCG/DOSE AEPB Inhale 1 puff into the lungs every 12 (twelve) hours. 11/28/20   Martina Sinner, MD  folic acid (FOLVITE) 1 MG tablet Take 1 mg by mouth daily. 01/29/20   [provider]  furosemide (LASIX) 40 MG tablet Take 1 tablet (40 mg total) by mouth as needed. Take 1 tablet by mouth daily for 3 days, then decrease to only as needed for increase weight gain 07/11/21   Dyann Kief, PA-C  gabapentin (NEURONTIN) 600 MG tablet Take 600 mg by mouth 3 (three) times daily.  02/04/19   [provider]  HUMIRA PEN 40 MG/0.4ML PNKT  Inject 40 mg as directed every 14 (fourteen) days. 04/09/21   [provider]  hydrALAZINE (APRESOLINE) 50 MG tablet Take 1 tablet (50 mg total) by mouth 3 (three) times daily. Patient taking differently: Take 50 mg by mouth 2 (two) times daily. 08/15/21   Quintella Reichert, MD  losartan (COZAAR) 100 MG tablet Take 1 tablet (100 mg total) by mouth daily. 06/14/21   Quintella Reichert, MD  methotrexate 2.5 MG tablet Take 15 mg by mouth every Sunday. 06/22/20   [provider]  montelukast (SINGULAIR) 10 MG tablet Take 1 tablet (10 mg total) by mouth at bedtime. 09/07/21   Betancourt, Jarold Song, NP  Potassium Chloride ER 20 MEQ TBCR Take 20 mEq by mouth in the morning and at bedtime. 06/08/21   Lilland, Alana, DO  predniSONE (DELTASONE) 5 MG tablet Take 5 mg by mouth daily. 01/29/20   [provider]  sildenafil (REVATIO) 20 MG tablet Take 20 mg by mouth daily as needed (erectile dysfunction). 08/03/20   [provider]  spironolactone (ALDACTONE) 25 MG tablet Take 0.5 tablets (12.5 mg total) by mouth daily. 08/09/21   Betancourt, Jarold Song, NP  verapamil (CALAN-SR) 180 MG CR tablet Take 1 tablet (180 mg total) by mouth daily. 08/09/21   Sheilah Pigeon, PA-C    Allergies    Amlodipine besylate and Aspirin  Review of Systems   Review of Systems  Constitutional:  Positive for fatigue (lightheaded epispdes). Negative for chills, diaphoresis and fever.  HENT:  Negative for congestion.   Eyes:  Negative for visual disturbance.  Respiratory:  Negative for cough and shortness of breath.   Cardiovascular:  Positive for chest pain and palpitations.  Gastrointestinal:  Negative for abdominal pain, constipation, diarrhea, nausea and vomiting.  Genitourinary:  Negative for flank pain and frequency.  Musculoskeletal:  Negative for back pain, neck pain and neck stiffness.  Skin:  Negative for rash and wound.  Neurological:  Negative for light-headedness, numbness and headaches.   Psychiatric/Behavioral:  Negative for agitation.   All other systems reviewed and are negative.  Physical Exam Updated Vital Signs There were no vitals taken for this visit.  Physical Exam Vitals and nursing note reviewed.  Constitutional:      General: He is not in acute distress.    Appearance: He is well-developed. He is not ill-appearing, toxic-appearing or diaphoretic.  HENT:     Head: Normocephalic  and atraumatic.  Eyes:     Conjunctiva/sclera: Conjunctivae normal.     Pupils: Pupils are equal, round, and reactive to light.  Cardiovascular:     Rate and Rhythm: Normal rate. Rhythm irregular. Extrasystoles are present.    Heart sounds: Murmur heard.  Pulmonary:     Effort: Pulmonary effort is normal. No respiratory distress.     Breath sounds: Normal breath sounds. No decreased breath sounds, wheezing, rhonchi or rales.  Chest:     Chest wall: No tenderness.  Abdominal:     Palpations: Abdomen is soft.     Tenderness: There is no abdominal tenderness.  Musculoskeletal:     Cervical back: Neck supple.     Right lower leg: Edema present.     Left lower leg: Edema present.  Skin:    General: Skin is warm and dry.     Capillary Refill: Capillary refill takes less than 2 seconds.  Neurological:     General: No focal deficit present.     Mental Status: He is alert.  Psychiatric:        Mood and Affect: Mood normal.    ED Results / Procedures / Treatments   Labs (all labs ordered are listed, but only abnormal results are displayed) Labs Reviewed  CBC WITH DIFFERENTIAL/PLATELET - Abnormal; Notable for the following components:      Result Value   RBC 4.06 (*)    MCV 101.5 (*)    MCH 34.5 (*)    Platelets 142 (*)    All other components within normal limits  COMPREHENSIVE METABOLIC PANEL - Abnormal; Notable for the following components:   Glucose, Bld 108 (*)    Creatinine, Ser 1.25 (*)    Total Protein 5.8 (*)    All other components within normal limits   BRAIN NATRIURETIC PEPTIDE - Abnormal; Notable for the following components:   B Natriuretic Peptide 116.3 (*)    All other components within normal limits  RESP PANEL BY RT-PCR (FLU A&B, COVID) ARPGX2  SARS CORONAVIRUS 2 (TAT 6-24 HRS)  MAGNESIUM  TROPONIN I (HIGH SENSITIVITY)  TROPONIN I (HIGH SENSITIVITY)    EKG EKG Interpretation  Date/Time:  Friday September 08 2021 11:40:11 EDT Ventricular Rate:  91 PR Interval:  214 QRS Duration: 168 QT Interval:  448 QTC Calculation: 473 R Axis:   85 Text Interpretation: Sinus rhythm Paired ventricular premature complexes Borderline prolonged PR interval Left atrial enlargement Right bundle branch block When compared to prior, more PVC. No STEMI Confirmed by Theda Belfast ([id number]) on 09/08/2021 11:45:22 AM  Radiology DG Chest Portable 1 View  Result Date: 09/08/2021 CLINICAL DATA:  65 year old male with history of chest pain. Ventricular tachycardia. EXAM: PORTABLE CHEST 1 VIEW COMPARISON:  Chest x-ray 08/08/2021. FINDINGS: Linear areas of atelectasis are noted in the left mid lung and right lung base. Lung volumes are low. No consolidative airspace disease. No pleural effusions. No pneumothorax. No evidence of pulmonary edema. Heart size is mildly enlarged. Upper mediastinal contours are within normal limits. Left-sided pacemaker/AICD in place with lead tips projecting over the expected location of the right atrium and right ventricle. IMPRESSION: 1. Low lung volumes with areas of subsegmental atelectasis in the left mid lung and right lung base. 2. Mild cardiomegaly. Electronically Signed   By: Trudie Reed M.D.   On: 09/08/2021 12:40    Procedures Procedures   Medications Ordered in ED Medications  acetaminophen (TYLENOL) tablet 650 mg (has no administration in time range)  sodium chloride  flush (NS) 0.9 % injection 3 mL (has no administration in time range)  sodium chloride flush (NS) 0.9 % injection 3 mL (has no administration in  time range)  0.9 %  sodium chloride infusion (has no administration in time range)  apixaban (ELIQUIS) tablet 5 mg (has no administration in time range)  potassium chloride SA (KLOR-CON) CR tablet 40 mEq (has no administration in time range)  magnesium sulfate IVPB 2 g 50 mL (has no administration in time range)  methotrexate (RHEUMATREX) tablet 15 mg (has no administration in time range)  atorvastatin (LIPITOR) tablet 40 mg (has no administration in time range)  chlorthalidone (HYGROTON) tablet 25 mg (has no administration in time range)  hydrALAZINE (APRESOLINE) tablet 50 mg (has no administration in time range)  losartan (COZAAR) tablet 100 mg (has no administration in time range)  spironolactone (ALDACTONE) tablet 12.5 mg (has no administration in time range)  predniSONE (DELTASONE) tablet 5 mg (has no administration in time range)  gabapentin (NEURONTIN) tablet 600 mg (has no administration in time range)  Potassium Chloride ER TBCR 20 mEq (has no administration in time range)  albuterol (VENTOLIN HFA) 108 (90 Base) MCG/ACT inhaler 2 puff (has no administration in time range)  fluticasone (FLONASE) 50 MCG/ACT nasal spray 1 spray (has no administration in time range)  fluticasone furoate-vilanterol (BREO ELLIPTA) 200-25 MCG/INH 1 puff (has no administration in time range)  montelukast (SINGULAIR) tablet 10 mg (has no administration in time range)  sotalol (BETAPACE) tablet 80 mg (has no administration in time range)    ED Course  I have reviewed the triage vital signs and the nursing notes.  Pertinent labs & imaging results that were available during my care of the patient were reviewed by me and considered in my medical decision making (see chart for details).    MDM Rules/Calculators/A&P                           Ryan Glass is a 65 y.o. male with a past medical history significant for HOCM with AICD, thoracic aortic aneurysm, admission recently for ventricular tachycardia,  pulmonary embolism on Eliquis therapy, hypertension, gout, chronic PVCs, and obesity who presents at the direction of his cardiologist for chest pain.  According to patient, since yesterday he has been having episodes of chest discomfort and was called by cardiology today that said he had around 11 episodes of ventricular tachycardia some of them needing to be aborted with ATP pacing.  Patient denies any large shocks that he felt but he does report he is having episodes of discomfort with a burning/aching discomfort that comes and goes.  He reports no shortness of breath but does report feeling lightheaded at times.  He denies nausea, vomiting, diaphoresis, or syncope.  He denies recent fevers, chills, ingestion, cough.  Denies any constipation, diarrhea, or urinary changes.  On exam, lungs were clear and chest was nontender.  Abdomen was nontender.  He does have a murmur.  Good pulses in extremities.  Legs are slightly edematous but he reports is at his baseline.  EKG shows numerous PVCs but no STEMI.  As patient was sent in by cardiology, we will call them for recommendations.  We will get screening labs including a preadmit COVID test.  We will interrogate his AICD as well.  Anticipate evaluation by cardiology with recommendations.  Cardiology will admit for further management.   Final Clinical Impression(s) / ED Diagnoses Final diagnoses:  Nonspecific  chest pain  Palpitations  Ventricular tachycardia (HCC)     Clinical Impression: 1. Nonspecific chest pain   2. Palpitations   3. Ventricular tachycardia (HCC)     Disposition: Admit  This note was prepared with assistance of Dragon voice recognition software. Occasional wrong-word or sound-a-like substitutions may have occurred due to the inherent limitations of voice recognition software.     Darran Gabay, Canary Brim, MD 09/08/21 [phone]

## 2021-09-08 NOTE — Progress Notes (Addendum)
Labs reviewed K+ 3.7 Mag 1.8 Creat 1.25 (Calc CrCl is 96)  K+ and mag replacement ordered EKG is reviewed with Dr. Lalla Brothers, QTc is acceptable to start sotalol 80mg  BID planned  Will plan to start drug in AM  , PA-C

## 2021-09-09 DIAGNOSIS — I472 Ventricular tachycardia, unspecified: Principal | ICD-10-CM

## 2021-09-09 LAB — BASIC METABOLIC PANEL
Anion gap: 9 (ref 5–15)
BUN: 15 mg/dL (ref 8–23)
CO2: 23 mmol/L (ref 22–32)
Calcium: 9 mg/dL (ref 8.9–10.3)
Chloride: 106 mmol/L (ref 98–111)
Creatinine, Ser: 1.23 mg/dL (ref 0.61–1.24)
GFR, Estimated: >60 mL/min (ref >60)
Glucose, Bld: 74 mg/dL (ref 70–99)
Potassium: 4 mmol/L (ref 3.5–5.1)
Sodium: 138 mmol/L (ref 135–145)

## 2021-09-09 LAB — MAGNESIUM: Magnesium: 2.2 mg/dL (ref 1.7–2.4)

## 2021-09-09 MED ORDER — SOTALOL HCL 120 MG PO TABS
120.0000 mg | ORAL_TABLET | Freq: Two times a day (BID) | ORAL | Status: DC
  Administered 2021-09-09 – 2021-09-12 (×7): 120 mg via ORAL
  Filled 2021-09-09 (×9): qty 1

## 2021-09-09 MED ORDER — HYDRALAZINE HCL 50 MG PO TABS
50.0000 mg | ORAL_TABLET | Freq: Three times a day (TID) | ORAL | Status: DC
  Administered 2021-09-09 – 2021-09-10 (×3): 50 mg via ORAL
  Filled 2021-09-09 (×3): qty 1

## 2021-09-09 NOTE — Progress Notes (Signed)
Patient Name: Ryan Glass     Patient Profile:    Ryan Glass is a  65 y.o. [place] male with a hx of with a hx of PVCs (ablated 2019 by Dr. Ladona Ridgel) with recurrent PVC and VT sustained since then,in stting of HCM (by MRI--no LGE), afib and  RBBB, TAA, CKD (II), HTN, RA(chronic therapy with Humira, methotrexate, prednisone), OSA w/CPAP, June 2022 found with small PE,  admitted  09/08/2021 with VT and decision to initiate sotalol.   SUBJECTIVE: Without complaints of chest pain or shortness of breath but some palpitations  Past Medical History:  Diagnosis Date   Ascending aortic aneurysm (HCC)    a. CT 05/2021 showed 4.4x4.4cm TAA.   Bilateral lower extremity edema    Chronic gout without tophus    followed by dr Sharmon Revere    (06-08-2020  per pt last episode right knee 3 wks ago)   CKD (chronic kidney disease), stage II    nephrology--- Virgina Norfolk PA (10-29-2019 note in epic scanned in  media)   Heart failure with mid-range ejection fraction [facility]) 05/27/2021   05/26/21: Left ventricular ejection fraction, by estimation, is 45 to 50%. There is severe asymmetric left ventricular hypertrophy. G1DD present.    History of COVID-19 06/07/2021   HOCM (hypertrophic obstructive cardiomyopathy) (HCC)    s/p ICD 07/2021   Hypertension    followed by cardiology, dr t. turner   (05-14-2018 nuclear study in epic , normal perfusion with nuclear ef 61%)   Left hydrocele    Low serum potassium level 04/27/2014   OSA on CPAP    per pt uses every night   Palpitations followed by dr t. Mayford Knife   06-08-2020  still feels palipations due to PVCs when exertion but not with chest pain/ discomfort   Pneumonia due to COVID-19 virus    PVC's (premature ventricular contractions) cardiologist--- dr t. turner   Status post PVC ablation by Dr. Ladona Ridgel 2019 with recurrence of frequent PVCs/bigeminy;  prior pseudobradycardia r/t pvcs   Rheumatoid arthritis involving multiple sites [facility])     rheumotology--- dr a. Sharmon Revere  ([facility] in [place])    Scheduled Meds:  Scheduled Meds:  apixaban  5 mg Oral BID   atorvastatin  40 mg Oral Daily   chlorthalidone  25 mg Oral Daily   fluticasone  1 spray Each Nare Daily   fluticasone furoate-vilanterol  1 puff Inhalation Daily   gabapentin  600 mg Oral TID with meals   hydrALAZINE  50 mg Oral Q8H   losartan  100 mg Oral Daily   [START ON 09/10/2021] methotrexate  15 mg Oral Weekly   montelukast  10 mg Oral QHS   potassium chloride  20 mEq Oral BID   predniSONE  5 mg Oral Q breakfast   sodium chloride flush  3 mL Intravenous Q12H   sotalol  80 mg Oral Q12H   spironolactone  12.5 mg Oral Daily   Continuous Infusions:  sodium chloride     sodium chloride, acetaminophen, albuterol, sodium chloride flush    PHYSICAL EXAM Vitals:   09/09/21 0556 09/09/21 0716 09/09/21 0800 09/09/21 0835  BP: (!) 146/108 (!) 149/108  (!) 134/104  Pulse:    75  Resp:      Temp:      TempSrc:      SpO2:   98% 95%  Weight:      Height:          Well  developed and nourished in no acute distress HENT normal Neck supple with JVP-  flat  Clear Irregular rate and rhythm, no murmurs or gallops Abd-soft with active BS No Clubbing cyanosis edema Skin-warm and dry A & Oriented  Grossly normal sensory and motor function  ECG sinus with frequent PVCs ECG 10/22 (post dose #1) QTC 507 with a QRS duration of 160  Intake/Output Summary (Last 24 hours) at 09/09/2021 0933 Last data filed at 09/09/2021 0730 Gross per 24 hour  Intake 663 ml  Output --  Net 663 ml    LABS: Basic Metabolic Panel: Recent Labs  Lab 09/08/21 1200 09/09/21 0138  NA 138 138  K 3.7 4.0  CL 108 106  CO2 22 23  GLUCOSE 108* 74  BUN 18 15  CREATININE 1.25* 1.23  CALCIUM 9.3 9.0  MG 1.8 2.2   Cardiac Enzymes: No results for input(s): CKTOTAL, CKMB, CKMBINDEX, TROPONINI in the last 72 hours. CBC: Recent Labs  Lab 09/08/21 1200  WBC 6.2  NEUTROABS 4.5  HGB 14.0   HCT 41.2  MCV 101.5*  PLT 142*   PROTIME: No results for input(s): LABPROT, INR in the last 72 hours. Liver Function Tests: Recent Labs    09/08/21 1200  AST 29  ALT 30  ALKPHOS 60  BILITOT 1.0  PROT 5.8*  ALBUMIN 3.7   No results for input(s): LIPASE, AMYLASE in the last 72 hours. 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 Merita Norton  Lengthy discussion genetic implications of HCM with the patient.  This was new to him.  He will need a outpatient consultation with Dr. Jomarie Longs  Blood pressure is poorly controlled but a number of changes have been made since admission, including the discontinuation of his carvedilol, the initiation of hydralazine and low-dose spironolactone.  I suspect he will need more of both.  He is allergic to amlodipine.      Signed, Sherryl Manges MD  09/09/2021

## 2021-09-09 NOTE — Progress Notes (Signed)
Post sotolol dose EKG called to Dr. Graciela Husbands.

## 2021-09-09 NOTE — Progress Notes (Signed)
Notified by nursing that BP continues to run high. Notes reviewed. Patient was on carvedilol at home which was not continued on admission. EP ream plans initiation of sotalol today. Per nurse patient has stated he feels a little funny but no specific localizing symptoms. He got his hydralazine early this AM - usually takes TID but only ordered BID here so will increase to TID. I asked nurse team to give spironolactone and losartan now. Will otherwise await EP review of BP plan and EKG this AM for next steps if any further titration is needed. Labs are stable.

## 2021-09-09 NOTE — Progress Notes (Signed)
Pt's SBP 150's-160's DBP 110's-120's. Pt also stated hasn't slept much & that he felt funny. Falk-Martin, MD made aware. Per Falk-Martin okay to go ahead & give 10 AM dose of PO hydralazine 50mg . Per PTA med list... pt normally takes this medication TID but med was only ordered BID starting at 09/08/21 @  22:00. Will continue to monitor

## 2021-09-09 NOTE — Progress Notes (Signed)
Pharmacy Consult for Sotalol Electrolyte Replacement  Pharmacy consulted to assist in monitoring and replacing electrolytes in this 65 y.o. male admitted on 09/08/2021 undergoing sotalol initiation . First sotalol dose: 09/09/2021  Labs:    Component Value Date/Time   K 4.0 09/09/2021 0138   MG 2.2 09/09/2021 0138     Plan: Potassium: K = 4  K >/= 4: Appropriate to initiate Sotalol, no replacement needed    Magnesium: Mg = 2.2 Mg >2: Appropriate to initiate Sotalol, no replacement needed    Thank you for involving pharmacy in this patient's care.  Arnette Felts, PharmD PGY1 Ambulatory Care Pharmacy Resident 09/09/2021 10:12 AM  **Pharmacist phone directory can be found on [email or web address] listed under [facility]**

## 2021-09-09 NOTE — Progress Notes (Signed)
RN notified of 10 beats VT at 0947.  Trigeminy and Bigeminy noted post VT episode.   Patient denies shortness of breath, chest pain, or dizziness.  K=4.0 Mg=2.2 (09/09/21 at 0138).  EKG obtained.    Dr. Graciela Husbands paged, stated OK to proceed with Sotolol Load and that he increased the dose of Sotolol from 80mg  to 120mg .

## 2021-09-09 NOTE — Progress Notes (Addendum)
Patient complained of intermittent shortness of breath.  O2 sats = 96% on RA.  Patient placed on continuous pulse OX monitoring.

## 2021-09-10 DIAGNOSIS — I10 Essential (primary) hypertension: Secondary | ICD-10-CM

## 2021-09-10 DIAGNOSIS — I480 Paroxysmal atrial fibrillation: Secondary | ICD-10-CM

## 2021-09-10 DIAGNOSIS — E876 Hypokalemia: Secondary | ICD-10-CM

## 2021-09-10 LAB — BASIC METABOLIC PANEL
Anion gap: 9 (ref 5–15)
BUN: 16 mg/dL (ref 8–23)
CO2: 23 mmol/L (ref 22–32)
Calcium: 9.5 mg/dL (ref 8.9–10.3)
Chloride: 104 mmol/L (ref 98–111)
Creatinine, Ser: 1.12 mg/dL (ref 0.61–1.24)
GFR, Estimated: >60 mL/min (ref >60)
Glucose, Bld: 87 mg/dL (ref 70–99)
Potassium: 3.5 mmol/L (ref 3.5–5.1)
Sodium: 136 mmol/L (ref 135–145)

## 2021-09-10 LAB — MAGNESIUM: Magnesium: 1.9 mg/dL (ref 1.7–2.4)

## 2021-09-10 MED ORDER — HYDRALAZINE HCL 50 MG PO TABS
75.0000 mg | ORAL_TABLET | Freq: Three times a day (TID) | ORAL | Status: DC
  Administered 2021-09-10 – 2021-09-11 (×3): 75 mg via ORAL
  Filled 2021-09-10 (×3): qty 1

## 2021-09-10 MED ORDER — POTASSIUM CHLORIDE CRYS ER 20 MEQ PO TBCR
20.0000 meq | EXTENDED_RELEASE_TABLET | Freq: Once | ORAL | Status: AC
  Administered 2021-09-10: 20 meq via ORAL
  Filled 2021-09-10: qty 1

## 2021-09-10 MED ORDER — MAGNESIUM SULFATE 2 GM/50ML IV SOLN
2.0000 g | Freq: Once | INTRAVENOUS | Status: AC
  Administered 2021-09-10: 2 g via INTRAVENOUS
  Filled 2021-09-10: qty 50

## 2021-09-10 MED ORDER — SPIRONOLACTONE 25 MG PO TABS
25.0000 mg | ORAL_TABLET | Freq: Every day | ORAL | Status: DC
  Administered 2021-09-10 – 2021-09-12 (×3): 25 mg via ORAL
  Filled 2021-09-10 (×3): qty 1

## 2021-09-10 MED ORDER — POTASSIUM CHLORIDE CRYS ER 20 MEQ PO TBCR: 40.0000 meq | EXTENDED_RELEASE_TABLET | Freq: Two times a day (BID) | ORAL | Status: DC

## 2021-09-10 MED ORDER — OXYCODONE HCL 5 MG PO TABS
5.0000 mg | ORAL_TABLET | Freq: Once | ORAL | Status: AC | PRN
  Administered 2021-09-10: 5 mg via ORAL
  Filled 2021-09-10: qty 1

## 2021-09-10 MED ORDER — POTASSIUM CHLORIDE CRYS ER 20 MEQ PO TBCR
40.0000 meq | EXTENDED_RELEASE_TABLET | Freq: Every day | ORAL | Status: DC
  Administered 2021-09-10 – 2021-09-11 (×2): 40 meq via ORAL
  Filled 2021-09-10: qty 2

## 2021-09-10 NOTE — Progress Notes (Addendum)
Notified by nursing that pt c/o headache. Tylenol earlier did not really help. No other concerning sx reported. Reviewed options. Migraine agents less ideal given elevated BP, caffeine, arrhythmias. Tramadol has risk of QT prolongation. There are no hydrocodone preparations available that don't already have additional acetaminophen in. Will try one time dose of oxycodone. PDMP reviewed. Sukhraj Esquivias PA-C

## 2021-09-10 NOTE — Progress Notes (Signed)
Pharmacy Consult for Sotalol Electrolyte Replacement- Follow Up  Pharmacy consulted to assist in monitoring and replacing electrolytes in this 65 y.o. male admitted on 09/08/2021 undergoing sotalol initiation . First sotalol dose: 09/09/2021  Labs:    Component Value Date/Time   K 3.5 09/10/2021 0613   MG 1.9 09/10/2021 9741     Plan: Potassium: K = 3.5 K 3.5-3.7:  Give KCl 60 mEq po x1  Per Cardiology: KCl 40 meq PO x 1 and inc spironolactone to 25 mg daily Consulted Cardiology and will also give extra KCl 20 mEq PO x 1 to complete total dose per protocol  Magnesium: Mg 1.8-2: Give Mg 2 gm IV x1   Thank you for involving pharmacy in this patient's care.  Arnette Felts, PharmD PGY1 Ambulatory Care Pharmacy Resident 09/10/2021 9:47 AM  **Pharmacist phone directory can be found on [email or web address] listed under [facility]**

## 2021-09-10 NOTE — Progress Notes (Addendum)
Patient Name: Nicole Kindred     Patient Profile:    MAHMUD KEITHLY is a  65 y.o. [place] male with a hx of with a hx of PVCs (ablated 2019 by Dr. Ladona Ridgel) with recurrent PVC and VT sustained since then,in stting of HCM (by MRI--no LGE), afib and  RBBB, TAA, CKD (II), HTN, RA(chronic therapy with Humira, methotrexate, prednisone), OSA w/CPAP, June 2022 found with small PE,  admitted  09/08/2021 with VT and decision to initiate sotalol.   SUBJECTIVE: denies any chest pain or SOB this am.  Dose complain of HA today.   Past Medical History:  Diagnosis Date   Ascending aortic aneurysm (HCC)    a. CT 05/2021 showed 4.4x4.4cm TAA.   Bilateral lower extremity edema    Chronic gout without tophus    followed by dr Sharmon Revere    (06-08-2020  per pt last episode right knee 3 wks ago)   CKD (chronic kidney disease), stage II    nephrology--- [place]ina [place] PA (10-29-2019 note in epic scanned in  media)   Heart failure with mid-range ejection fraction [facility]) 05/27/2021   05/26/21: Left ventricular ejection fraction, by estimation, is 45 to 50%. There is severe asymmetric left ventricular hypertrophy. G1DD present.    History of COVID-19 06/07/2021   HOCM (hypertrophic obstructive cardiomyopathy) (HCC)    s/p ICD 07/2021   Hypertension    followed by cardiology, dr t. Neely Kammerer   (05-14-2018 nuclear study in epic , normal perfusion with nuclear ef 61%)   Left hydrocele    Low serum potassium level 04/27/2014   OSA on CPAP    per pt uses every night   Palpitations followed by dr t. Mayford Knife   06-08-2020  still feels palipations due to PVCs when exertion but not with chest pain/ discomfort   Pneumonia due to COVID-19 virus    PVC's (premature ventricular contractions) cardiologist--- dr t. Climmie Buelow   Status post PVC ablation by Dr. Ladona Ridgel 2019 with recurrence of frequent PVCs/bigeminy;  prior pseudobradycardia r/t pvcs   Rheumatoid arthritis involving multiple sites [facility])    rheumotology---  dr a. Sharmon Revere  ([facility] in [place])    Scheduled Meds:  Scheduled Meds:  apixaban  5 mg Oral BID   atorvastatin  40 mg Oral Daily   chlorthalidone  25 mg Oral Daily   fluticasone  1 spray Each Nare Daily   fluticasone furoate-vilanterol  1 puff Inhalation Daily   gabapentin  600 mg Oral TID with meals   hydrALAZINE  50 mg Oral Q8H   losartan  100 mg Oral Daily   methotrexate  15 mg Oral Weekly   montelukast  10 mg Oral QHS   potassium chloride  20 mEq Oral BID   predniSONE  5 mg Oral Q breakfast   sodium chloride flush  3 mL Intravenous Q12H   sotalol  120 mg Oral Q12H   spironolactone  12.5 mg Oral Daily   Continuous Infusions:  sodium chloride     sodium chloride, acetaminophen, albuterol, oxyCODONE, sodium chloride flush    PHYSICAL EXAM Vitals:   09/09/21 2024 09/10/21 0008 09/10/21 0520 09/10/21 0603  BP: (!) 148/112 (!) 133/105 (!) 153/106 (!) 149/110  Pulse: 87  80   Resp: 16  16   Temp: 98.5 F (36.9 C)  98.5 F (36.9 C)   TempSrc: Oral  Oral   SpO2: 99%  95%   Weight:   111.1 kg   Height:  GEN: Well nourished, well developed in no acute distress HEENT: Normal NECK: No JVD; No carotid bruits LYMPHATICS: No lymphadenopathy CARDIAC:RRR, no murmurs, rubs, gallops RESPIRATORY:  Clear to auscultation without rales, wheezing or rhonchi  ABDOMEN: Soft, non-tender, non-distended MUSCULOSKELETAL:  No edema; No deformity  SKIN: Warm and dry NEUROLOGIC:  Alert and oriented x 3 PSYCHIATRIC:  Normal affect    ECG personally reviewed today and shows NSR with V pacing and intermittent AV pacing ECG today shows atrial paced V sensed with RBBB and PVC.  QTC ( yesterday)  Intake/Output Summary (Last 24 hours) at 09/10/2021 0902 Last data filed at 09/10/2021 2426 Gross per 24 hour  Intake 723 ml  Output --  Net 723 ml     LABS: Basic Metabolic Panel: Recent Labs  Lab 09/08/21 1200 09/09/21 0138 09/10/21 0613  NA 138 138 136  K 3.7 4.0 3.5   CL 108 106 104  CO2 22 23 23   GLUCOSE 108* 74 87  BUN 18 15 16   CREATININE 1.25* 1.23 1.12  CALCIUM 9.3 9.0 9.5  MG 1.8 2.2 1.9    Cardiac Enzymes: No results for input(s): CKTOTAL, CKMB, CKMBINDEX, TROPONINI in the last 72 hours. CBC: Recent Labs  Lab 09/08/21 1200  WBC 6.2  NEUTROABS 4.5  HGB 14.0  HCT 41.2  MCV 101.5*  PLT 142*    PROTIME: No results for input(s): LABPROT, INR in the last 72 hours. Liver Function Tests: Recent Labs    09/08/21 1200  AST 29  ALT 30  ALKPHOS 60  BILITOT 1.0  PROT 5.8*  ALBUMIN 3.7    No results for input(s): LIPASE, AMYLASE in the last 72 hours. BNP: BNP (last 3 results) Recent Labs    06/06/21 1703 08/02/21 2201 09/08/21 1400  BNP 74.2 50.5 116.3*     ProBNP (last 3 results) Recent Labs    02/02/21 1643 02/03/21 1515 02/06/21 1028  PROBNP 389* 141 47       ASSESSMENT AND PLAN:  Ventricular tachycardia/PVCs  LBBB/Inferior axis wth QS avR/avL consistent with summit origin and with variable (and some very long) ID  HCM with asymmetric septal hypertrophy  (18/10)  HTN -BP remains poorly controlled at 133/124mmHg -Continue Losartan 100mg  daily, chlorthalidone 25mg  daily -increase Hydralazine to 75mg  TID and 02/08/21 to 25mg  daily -BMET in am -he is allergic to amlodipine  ICD   -per EP  AFib -maintaining A pacing V sensing on EKG and tele -continue Sotolol 120mg  BID (QTC stable) and Apixaban 5mg  BID  HOCM -cMRI with severe ASSH (22mm septum and 61mm basal lateral wall) with patchy LGE at RV insertion site and basal lateral wall -noted on cMRI report that Fabry's dz can present with basal lateral LGE and LVH so will check alpha-galactosidase A level -LVF normal with EF 52% and RVF 45%needs outpt consult with Dr. -s/p ICD  Dilated ascending aorta -72mm on cMRI -will need serial followup  Hypokalemia -borderline low K+ today -increasing spiro which should help -give Kdur Cleda Daub today -BMET  in am  I have spent a total of 30 minutes with patient reviewing cMRI , telemetry, EKGs, labs and examining patient as well as establishing an assessment and plan that was discussed with the patient.  > 50% of time was spent in direct patient care.     Signed, MD  09/10/2021

## 2021-09-11 ENCOUNTER — Encounter: Payer: No Typology Code available for payment source | Admitting: Physician Assistant

## 2021-09-11 DIAGNOSIS — I10 Essential (primary) hypertension: Secondary | ICD-10-CM | POA: Diagnosis not present

## 2021-09-11 LAB — BASIC METABOLIC PANEL
Anion gap: 9 (ref 5–15)
BUN: 17 mg/dL (ref 8–23)
CO2: 23 mmol/L (ref 22–32)
Calcium: 9 mg/dL (ref 8.9–10.3)
Chloride: 102 mmol/L (ref 98–111)
Creatinine, Ser: 1.28 mg/dL — ABNORMAL HIGH (ref 0.61–1.24)
GFR, Estimated: >60 mL/min (ref >60)
Glucose, Bld: 81 mg/dL (ref 70–99)
Potassium: 3.4 mmol/L — ABNORMAL LOW (ref 3.5–5.1)
Sodium: 134 mmol/L — ABNORMAL LOW (ref 135–145)

## 2021-09-11 LAB — MAGNESIUM: Magnesium: 2.1 mg/dL (ref 1.7–2.4)

## 2021-09-11 MED ORDER — POTASSIUM CHLORIDE CRYS ER 20 MEQ PO TBCR
40.0000 meq | EXTENDED_RELEASE_TABLET | ORAL | Status: AC
  Filled 2021-09-11: qty 2

## 2021-09-11 MED ORDER — HYDRALAZINE HCL 50 MG PO TABS
100.0000 mg | ORAL_TABLET | Freq: Three times a day (TID) | ORAL | Status: DC
  Administered 2021-09-11 – 2021-09-12 (×4): 100 mg via ORAL
  Filled 2021-09-11 (×4): qty 2

## 2021-09-11 NOTE — Care Management (Signed)
09-11-21 0932 Benefits check submitted for sotalol. Case Manager will follow for cost.

## 2021-09-11 NOTE — Progress Notes (Addendum)
Electrophysiology Rounding Note  Patient Name: ABDIAZIZ Glass Date of Encounter: 09/11/2021  Primary Cardiologist: [name], MD Electrophysiologist: [name], MD   Subjective   The patient is doing well today.  At this time, the patient denies chest pain, shortness of breath, or any new concerns. He is worried about short term disability and going back to work.   Inpatient Medications    Scheduled Meds:  apixaban  5 mg Oral BID   atorvastatin  40 mg Oral Daily   chlorthalidone  25 mg Oral Daily   fluticasone  1 spray Each Nare Daily   fluticasone furoate-vilanterol  1 puff Inhalation Daily   gabapentin  600 mg Oral TID with meals   hydrALAZINE  100 mg Oral Q8H   losartan  100 mg Oral Daily   methotrexate  15 mg Oral Weekly   montelukast  10 mg Oral QHS   potassium chloride  40 mEq Oral Daily   potassium chloride  40 mEq Oral STAT   predniSONE  5 mg Oral Q breakfast   sodium chloride flush  3 mL Intravenous Q12H   sotalol  120 mg Oral Q12H   spironolactone  25 mg Oral Daily   Continuous Infusions:  sodium chloride     PRN Meds: sodium chloride, acetaminophen, albuterol, sodium chloride flush   Vital Signs    Vitals:   09/10/21 1501 09/10/21 2057 09/11/21 0352 09/11/21 0530  BP: (!) 126/92 (!) 129/97 124/87 (!) 136/110  Pulse: 80 80 80   Resp: 19 20 18    Temp: 97.7 F (36.5 C) (!) 97.3 F (36.3 C) 97.9 F (36.6 C)   TempSrc: Oral Oral Oral   SpO2: 95% 97% 94%   Weight:   111.9 kg   Height:        Intake/Output Summary (Last 24 hours) at 09/11/2021 0754 Last data filed at 09/11/2021 0357 Gross per 24 hour  Intake 366 ml  Output 1 ml  Net 365 ml   Filed Weights   09/09/21 0358 09/10/21 0520 09/11/21 0352  Weight: 112.3 kg 111.1 kg 111.9 kg    Physical Exam    GEN- The patient is well appearing, alert and oriented x 3 today.   Head- normocephalic, atraumatic Eyes-  Sclera clear, conjunctiva pink Ears- hearing intact Oropharynx-  clear Neck- supple Lungs- Clear to ausculation bilaterally, normal work of breathing Heart- Regular rate and rhythm, no murmurs, rubs or gallops GI- soft, NT, ND, + BS Extremities- no clubbing or cyanosis. No edema Skin- no rash or lesion Psych- euthymic mood, full affect Neuro- strength and sensation are intact  Labs    CBC Recent Labs    09/08/21 1200  WBC 6.2  NEUTROABS 4.5  HGB 14.0  HCT 41.2  MCV 101.5*  PLT 142*   Basic Metabolic Panel Recent Labs    09/10/21 0613 09/11/21 0508  NA 136 134*  K 3.5 3.4*  CL 104 102  CO2 23 23  GLUCOSE 87 81  BUN 16 17  CREATININE 1.12 1.28*  CALCIUM 9.5 9.0  MG 1.9 2.1   Liver Function Tests Recent Labs    09/08/21 1200  AST 29  ALT 30  ALKPHOS 60  BILITOT 1.0  PROT 5.8*  ALBUMIN 3.7   No results for input(s): LIPASE, AMYLASE in the last 72 hours. Cardiac Enzymes No results for input(s): CKTOTAL, CKMB, CKMBINDEX, TROPONINI in the last 72 hours.   Telemetry    NSR with PVCs up to bigeminy, no  further sustained VT (personally reviewed)  Radiology    No results found.  Patient Profile     Ryan Glass is a 65 y.o. male with a past medical history significant for HCM with asymmetric septal hypertrophy, VT, PVCs, LBBB, HTN, s/p ICD, PAF, and RA.  he was admitted for VT storm   Assessment & Plan    VT Quiescent currently on sotalol 120 mg BID. QTc OK by EKG last night If QTc remains stable today, potentially home tomorrow. Keep K > 4.0 and Mg > 2.0   2. HTN Increase hydralazine to 100 mg TID Continue spironolactone  3. HCM with asymmetric septal hypertrophy Will need outpatient consultation with Dr. Jomarie Longs  4. PAF On eliqius for CHA2DS2/VASc of at least 3   5. Hypokalemia K 3.4. Supp ordered      For questions or updates, please contact [facility] Please consult [email or web address] for contact info under Cardiology/STEMI.  Signed, Graciella Freer, PA-C  09/11/2021, 7:54 AM   EP  Attending  Patient seen and examined. Agree with the findings as noted above. The patient is doing well with sotalol. He has not had any more sustained VT. Very brief NSVT and still with some PVC's on 120 bid of sotalol. We will continue with plan for DC home tomorrow provided VT is quiet. Potassium is being repleted.   Sharlot Gowda Camilla Skeen,MD

## 2021-09-11 NOTE — Progress Notes (Signed)
Pharmacy Consult for Sotalol Electrolyte Replacement- Follow Up  Pharmacy consulted to assist in monitoring and replacing electrolytes in this 65 y.o. male admitted on 09/08/2021 undergoing sotalol initiation . First sotalol dose: 09/09/21  Labs:    Component Value Date/Time   K 3.4 (L) 09/11/2021 0508   MG 2.1 09/11/2021 0508     Plan: Potassium: K < 3.5:  Give KCl 40 mEq q4hr x2   Magnesium: Mg > 2: No additional supplementation needed   Fredonia Highland, PharmD, BCPS, [facility] Clinical Pharmacist [phone] Please check AMION for all [facility] Pharmacy numbers 09/11/2021

## 2021-09-11 NOTE — Telephone Encounter (Signed)
Follow Up:      Patient is calling to check on the status of his paperwork. He says he needs this asap please, so he can get paid.,

## 2021-09-11 NOTE — Telephone Encounter (Signed)
Called and spoke with patient who is currently admitted into the hospital to let him know the letter has been completed and is ready for pick up. Patient states that it needs to be faxed to [facility] I have faxedOV notes from 09/05/21 and note to [phone]. Nothing further needed at this time.

## 2021-09-11 NOTE — TOC Benefit Eligibility Note (Signed)
Transition of Care [facility]) Benefit Eligibility Note    Patient Details  Name: Ryan Glass MRN: [id number] Date of Birth: 1955/12/13   Medication/Dose: SOTALOL ( BETAPACE ) TABLET  120 MG  EVERY 12 HRS  Covered?: Yes  Tier:  (1 DRUG)  Prescription Coverage Preferred Pharmacy: [facility] [address]  Spoke with Person/Company/Phone Number:: VICTOR  @ [employer] #  [phone]  Co-Pay: $10.00  Prior Approval: No  Deductible:  (NO DEDUCTIBEL WITH PLAN  /  OUT-OF-POCKET)       Mardene Sayer Phone Number: 09/11/2021, 11:36 AM

## 2021-09-12 ENCOUNTER — Encounter: Payer: Self-pay | Admitting: Student

## 2021-09-12 ENCOUNTER — Other Ambulatory Visit (HOSPITAL_COMMUNITY): Payer: Self-pay

## 2021-09-12 ENCOUNTER — Telehealth: Payer: Self-pay | Admitting: Registered Nurse

## 2021-09-12 ENCOUNTER — Encounter: Payer: Self-pay | Admitting: Registered Nurse

## 2021-09-12 LAB — BASIC METABOLIC PANEL
Anion gap: 8 (ref 5–15)
Anion gap: 9 (ref 5–15)
BUN: 21 mg/dL (ref 8–23)
BUN: 23 mg/dL (ref 8–23)
CO2: 22 mmol/L (ref 22–32)
CO2: 21 mmol/L — ABNORMAL LOW (ref 22–32)
Calcium: 9.3 mg/dL (ref 8.9–10.3)
Calcium: 9.6 mg/dL (ref 8.9–10.3)
Chloride: 104 mmol/L (ref 98–111)
Chloride: 104 mmol/L (ref 98–111)
Creatinine, Ser: 1.21 mg/dL (ref 0.61–1.24)
Creatinine, Ser: 1.28 mg/dL — ABNORMAL HIGH (ref 0.61–1.24)
GFR, Estimated: >60 mL/min (ref >60)
GFR, Estimated: >60 mL/min (ref >60)
Glucose, Bld: 85 mg/dL (ref 70–99)
Glucose, Bld: 77 mg/dL (ref 70–99)
Potassium: 3.3 mmol/L — ABNORMAL LOW (ref 3.5–5.1)
Potassium: 3.3 mmol/L — ABNORMAL LOW (ref 3.5–5.1)
Sodium: 134 mmol/L — ABNORMAL LOW (ref 135–145)
Sodium: 134 mmol/L — ABNORMAL LOW (ref 135–145)

## 2021-09-12 LAB — MAGNESIUM: Magnesium: 2 mg/dL (ref 1.7–2.4)

## 2021-09-12 MED ORDER — SPIRONOLACTONE 25 MG PO TABS: 25.0000 mg | ORAL_TABLET | Freq: Every day | ORAL | 6 refills | Status: DC

## 2021-09-12 MED ORDER — HYDRALAZINE HCL 100 MG PO TABS: 100.0000 mg | ORAL_TABLET | Freq: Three times a day (TID) | ORAL | 6 refills | Status: DC

## 2021-09-12 MED ORDER — POTASSIUM CHLORIDE CRYS ER 20 MEQ PO TBCR
40.0000 meq | EXTENDED_RELEASE_TABLET | ORAL | Status: AC
  Administered 2021-09-12 (×2): 40 meq via ORAL
  Filled 2021-09-12 (×2): qty 2

## 2021-09-12 MED ORDER — POTASSIUM CHLORIDE CRYS ER 20 MEQ PO TBCR: 40.0000 meq | EXTENDED_RELEASE_TABLET | Freq: Every day | ORAL | Status: DC

## 2021-09-12 MED ORDER — SOTALOL HCL 120 MG PO TABS
120.0000 mg | ORAL_TABLET | Freq: Two times a day (BID) | ORAL | 6 refills | Status: DC
  Filled 2021-09-12: qty 60, 30d supply, fill #0

## 2021-09-12 MED ORDER — MAGNESIUM SULFATE 2 GM/50ML IV SOLN
2.0000 g | Freq: Once | INTRAVENOUS | Status: AC
  Administered 2021-09-12: 2 g via INTRAVENOUS
  Filled 2021-09-12: qty 50

## 2021-09-12 MED ORDER — POTASSIUM CHLORIDE CRYS ER 20 MEQ PO TBCR: 40.0000 meq | EXTENDED_RELEASE_TABLET | Freq: Two times a day (BID) | ORAL | 6 refills | Status: DC

## 2021-09-12 NOTE — Progress Notes (Signed)
Pharmacy Consult for Sotalol Electrolyte Replacement- Follow Up  Pharmacy consulted to assist in monitoring and replacing electrolytes in this 65 y.o. male admitted on 09/08/2021 undergoing sotalol initiation . First sotalol dose: 09/09/21  Labs:    Component Value Date/Time   K 3.3 (L) 09/12/2021 0646   MG 2.0 09/12/2021 0646     Plan: Potassium: K < 3.5:  Give KCl 40 mEq q4hr x2   Magnesium: Mg 1.8-2: Give Mg 2 gm IV x1    Fredonia Highland, PharmD, [place], [facility] Clinical Pharmacist [phone] Please check AMION for all [facility] Pharmacy numbers 09/12/2021

## 2021-09-12 NOTE — Telephone Encounter (Signed)
Spoke with the patient who states that he needs FMLA paperwork filled out with start date of 9/27. Patient states that he discussed with Dr. Ladona Ridgel this morning prior to discharge.

## 2021-09-12 NOTE — Telephone Encounter (Signed)
Patient contacted via telephone after noting in Epic hospitalized 09/08/21 at [facility].  Patient was instructed by cardiology to go to hospital via ambulance for VT noted on his implantable device.  Patient started on sotalol and medications BP adjusted again.  Potassium a little low today.  Patient was told by PA he would be going home this afternoon after potassium replacement.  Patient ready to go back home due to unpalatable food in hospital and unable to rest/sleep well.  Discussed with patient I would follow up with him via telephone again tomorrow.  Patient verbalized understanding information/instructions, agreed with plan of care and had no further questions at this time.

## 2021-09-12 NOTE — Progress Notes (Signed)
Work note

## 2021-09-12 NOTE — Discharge Summary (Addendum)
ELECTROPHYSIOLOGY DISCHARGE SUMMARY    Patient ID: Ryan Glass,  MRN: [id number], DOB/AGE: 06/19/1956 65 y.o.  Admit date: 09/08/2021 Discharge date: 09/12/2021  Primary Care Physician: [name], MD  Primary Cardiologist: [name], MD  Electrophysiologist: Dr. Ladona Ridgel  Primary Discharge Diagnosis:  Ventricular tachycardia with ICD therapy  Secondary Discharge Diagnosis:  HTN HCM with asymmetric septal hypertrophy PAF Hypokalemia  Procedures This Admission:  Sotalol Load  Brief HPI: Ryan Glass is a 65 y.o. male with a history of HCM with asymmetric septal hypertrophy, VT, PVCs, LBBB, HTN, ICD placement, PAF, and RA admitted for VT storm.   Hospital Course:  The patient was admitted 09/08/21 per device clinic after alert showed VT storm with multiple episodes requiring VT. Presented to [facility] and decision made to load on Sotalol.  He was monitored on telemetry which demonstrated gradual improvement of his VT. Still with occasional NSVT and PVCs up to bigeminy, but no further sustained VT on sotalol.  Pt required ongoing supplementation for hypokalemia. QTc remained stable when measured manually and corrected for QRS or PVCs. The patient was examined am of 09/12/21 and considered to be stable for discharge. He was determined safe for discharge with low K with increased supp and close recheck as an outpatient given that he already has an ICD implanted. [place] driving guidelines of no driving x 6 months after appropriate ICD therapy reviewed with the patient who verbalized understanding.  The patient will be seen back by  EP APP in 1 week for post hospital care on sotalol.  Physical Exam: Vitals:   09/11/21 2155 09/12/21 0554 09/12/21 0751 09/12/21 1257  BP: (!) 123/101 (!) 136/113  131/77  Pulse:  80 82   Resp:  19 18   Temp:  97.8 F (36.6 C)    TempSrc:  Oral    SpO2:  95% 95%   Weight:  111.5 kg    Height:        GEN- The patient is well appearing, alert and  oriented x 3 today.   HEENT: normocephalic, atraumatic; sclera clear, conjunctiva pink; hearing intact; oropharynx clear; neck supple  Lungs- Clear to ausculation bilaterally, normal work of breathing.  No wheezes, rales, rhonchi Heart- Somewhat irregular rate and rhythm due to ectopy, no murmurs, rubs or gallops  GI- soft, non-tender, non-distended, bowel sounds present  Extremities- no clubbing, cyanosis, or edema; DP/PT/radial pulses 2+ bilaterally, groin without hematoma/bruit MS- no significant deformity or atrophy Skin- warm and dry, no rash or lesion Psych- euthymic mood, full affect Neuro- strength and sensation are intact   Labs:   Lab Results  Component Value Date   WBC 6.2 09/08/2021   HGB 14.0 09/08/2021   HCT 41.2 09/08/2021   MCV 101.5 (H) 09/08/2021   PLT 142 (L) 09/08/2021    Recent Labs  Lab 09/08/21 1200 09/09/21 0138 09/12/21 1046  NA 138   < > 134*  K 3.7   < > 3.3*  CL 108   < > 104  CO2 22   < > 21*  BUN 18   < > 23  CREATININE 1.25*   < > 1.28*  CALCIUM 9.3   < > 9.6  PROT 5.8*  --   --   BILITOT 1.0  --   --   ALKPHOS 60  --   --   ALT 30  --   --   AST 29  --   --   GLUCOSE 108*   < >  77   < > = values in this interval not displayed.     Discharge Medications:  Allergies as of 09/12/2021       Reactions   Amlodipine Besylate Swelling, Other (See Comments)   Leg swelling with 10mg  daily am dosing; decreased with BID 5mg  dosing   Aspirin Itching        Medication List     STOP taking these medications    carvedilol 6.25 MG tablet Commonly known as: COREG   Potassium Chloride ER 20 MEQ Tbcr   verapamil 180 MG CR tablet Commonly known as: CALAN-SR       TAKE these medications    acetaminophen-codeine 300-30 MG tablet Commonly known as: TYLENOL #3 Take 1-2 tablets by mouth every 12 (twelve) hours as needed for moderate pain.   albuterol 108 (90 Base) MCG/ACT inhaler Commonly known as: VENTOLIN HFA Inhale 2 puffs into  the lungs every 4 (four) hours as needed for wheezing or shortness of breath. What changed:  when to take this additional instructions   apixaban 5 MG Tabs tablet Commonly known as: ELIQUIS Take 1 tablet (5 mg total) by mouth 2 (two) times daily.   aspirin EC 81 MG tablet Take 1 tablet (81 mg total) by mouth daily. Swallow whole.   atorvastatin 40 MG tablet Commonly known as: LIPITOR Take 1 tablet (40 mg total) by mouth daily. What changed: when to take this   chlorthalidone 25 MG tablet Commonly known as: HYGROTON Take 25 mg by mouth at bedtime.   colchicine 0.6 MG tablet Take 1-2 tablets by mouth daily as needed (for gout flares).   diclofenac 75 MG EC tablet Commonly known as: VOLTAREN Take 75 mg by mouth in the morning.   fluticasone 50 MCG/ACT nasal spray Commonly known as: FLONASE Place 1 spray into both nostrils daily. What changed:  how much to take when to take this   Fluticasone-Salmeterol 250-50 MCG/DOSE Aepb Commonly known as: ADVAIR Inhale 1 puff into the lungs every 12 (twelve) hours.   folic acid 1 MG tablet Commonly known as: FOLVITE Take 1 mg by mouth daily.   furosemide 40 MG tablet Commonly known as: LASIX Take 1 tablet (40 mg total) by mouth as needed. Take 1 tablet by mouth daily for 3 days, then decrease to only as needed for increase weight gain What changed:  when to take this additional instructions   gabapentin 600 MG tablet Commonly known as: NEURONTIN Take 600 mg by mouth with breakfast, with lunch, and with evening meal.   Humira Pen 40 MG/0.4ML Pnkt Generic drug: Adalimumab Inject 40 mg into the skin every 14 (fourteen) days.   hydrALAZINE 100 MG tablet Commonly known as: APRESOLINE Take 1 tablet (100 mg total) by mouth every 8 (eight) hours. What changed:  medication strength how much to take when to take this   losartan 100 MG tablet Commonly known as: COZAAR Take 1 tablet (100 mg total) by mouth daily.    methotrexate 2.5 MG tablet Take 15 mg by mouth every Sunday.   montelukast 10 MG tablet Commonly known as: SINGULAIR Take 1 tablet (10 mg total) by mouth at bedtime.   potassium chloride SA 20 MEQ tablet Commonly known as: KLOR-CON Take 2 tablets (40 mEq total) by mouth 2 (two) times daily.   predniSONE 5 MG tablet Commonly known as: DELTASONE Take 5 mg by mouth daily with breakfast.   sildenafil 20 MG tablet Commonly known as: REVATIO Take 20 mg by mouth  daily as needed (erectile dysfunction).   sotalol 120 MG tablet Commonly known as: BETAPACE Take 1 tablet (120 mg total) by mouth every 12 (twelve) hours.   spironolactone 25 MG tablet Commonly known as: ALDACTONE Take 1 tablet (25 mg total) by mouth daily. Start taking on: September 13, 2021 What changed: how much to take        Disposition:    Follow-up Information     [facility] Follow up.   Why: 10/5 at 130 for lab.   Can come any time 0800 - 1200 or 130 - 400 pm Contact information: [address] [place] [address] [phone]        Graciella Freer, PA-C Follow up.   Specialty: Physician Assistant Why: on 10/11 at 0900 for post hospital follow up Contact information: [address] [place] [address] [phone]                 Duration of Discharge Encounter: Greater than 30 minutes including physician time.  Dustin Flock, PA-C  09/12/2021 12:59 PM  EP Attending  Patient seen and examined. Agree with the findings as noted above. The patient is improved. He has had no more sustained VT or ICD therapies. He denies chest pain or sob. His potassium is low but will be repleted. His volume status is at baseline. He will be discharged home with followup as noted above.   Sharlot Gowda Alanni Vader,MD

## 2021-09-13 ENCOUNTER — Other Ambulatory Visit: Payer: Self-pay

## 2021-09-13 ENCOUNTER — Other Ambulatory Visit: Payer: No Typology Code available for payment source | Admitting: *Deleted

## 2021-09-13 ENCOUNTER — Other Ambulatory Visit (HOSPITAL_COMMUNITY): Payer: Self-pay

## 2021-09-13 DIAGNOSIS — Z79899 Other long term (current) drug therapy: Secondary | ICD-10-CM

## 2021-09-13 DIAGNOSIS — I10 Essential (primary) hypertension: Secondary | ICD-10-CM

## 2021-09-13 LAB — BASIC METABOLIC PANEL
BUN/Creatinine Ratio: 21 (ref 10–24)
BUN: 39 mg/dL — ABNORMAL HIGH (ref 8–27)
CO2: 18 mmol/L — ABNORMAL LOW (ref 20–29)
Calcium: 9.7 mg/dL (ref 8.6–10.2)
Chloride: 103 mmol/L (ref 96–106)
Creatinine, Ser: 1.9 mg/dL — ABNORMAL HIGH (ref 0.76–1.27)
Glucose: 86 mg/dL (ref 70–99)
Potassium: 4.7 mmol/L (ref 3.5–5.2)
Sodium: 137 mmol/L (ref 134–144)
eGFR: 39 mL/min/{1.73_m2} — ABNORMAL LOW (ref >59)

## 2021-09-13 NOTE — Telephone Encounter (Signed)
Results for Ryan Glass, Ryan Glass (MRN [id number]) as of 09/13/2021 20:26  Ref. Range 09/12/2021 10:46 09/13/2021 55:20  BASIC METABOLIC PANEL Unknown Rpt (A) Rpt (A)  Sodium Latest Ref Range: 134 - 144 mmol/L 134 (L) 137  Potassium Latest Ref Range: 3.5 - 5.2 mmol/L 3.3 (L) 4.7  Chloride Latest Ref Range: 96 - 106 mmol/L 104 103  CO2 Latest Ref Range: 20 - 29 mmol/L 21 (L) 18 (L)  Glucose Latest Ref Range: 70 - 99 mg/dL 77 86  BUN Latest Ref Range: 8 - 27 mg/dL 23 39 (H)  Creatinine Latest Ref Range: 0.76 - 1.27 mg/dL 1.28 (H) 1.90 (H)  Calcium Latest Ref Range: 8.6 - 10.2 mg/dL 9.6 9.7  Anion gap Latest Ref Range: 5 - 15  9   BUN/Creatinine Ratio Latest Ref Range: 10 - 24   21  eGFR Latest Ref Range: >59 mL/min/1.73  39 (L)  GFR, Estimated Latest Ref Range: >60 mL/min >60   Patient notified I talked with pharmacist and he can continue potassium chloride extended release tablets as both of his prescriptions are extended release formulations.  Patient reported he reviewed lab results from today on my chart.  Reviewed again with patient potassium now normal, BUN/CR elevated and GFR decreased.  He reported he was not drinking much water in the hospital only when taking his medication.  He will ensure he is back to drinking his usual amount of water 1-2 Yeti cups per day.  Patient denied flank/back pain.  Urinating without difficulty.  Reviewed up to date sotalol information and no adverse renal function effects typically noted.  Patient did notice bruise under armpit today unsure what caused it.  Patient to continue to monitor and if enlarging to notify his care team as platelets low on last CBC and on blood thinner.  Patient A&Ox3 spoke full sentences without difficulty no cough/congestion/throat clearing/shortness of breath or wheezing noted during 10 minute telephone call.  Patient verbalized understanding information/instructions, agreed with plan of care and had no further questions at this time.

## 2021-09-13 NOTE — Telephone Encounter (Signed)
Late entry spoke with patient and spouse 1400 today duration of call 18 minutes.  Patient reported he had blood drawn today to recheck potassium level and results still pending.  Patient asking if he has to go to pharmacy to get new potassium Rx as he still has potassium chloride ER tablets at home.  Discussed with patient I would check with pharmacist to see if any pharmacological difference between tablets and new potassium chloride crys ER 20 meq 2 times daily ordered on discharge summary as I could not find information on up to date to compare the two formulas.  Patient reported feeling well.  Has follow up appt for 6 min walk test with pulmonology on 11 Oct and cardiology.  He is hoping to be able to return to work on 12 Oct.  Discussed discharge instructions with patient and noted that cardiology note states no driving x 6 months.  Reiterated this with patient and spouse today.  Reviewed [place] DMV site information on medical evaluations with patient.  Patient stated cardiologist was not submitting him to [facility] for formal review at time of hospital discharge.  [email or web address]   Patient stated he has all ordered medications but he did receive notice refills are available for pick up at [facility] and he is planning to go tomorrow to pick them up.  Patient also has follow up appts with sleep med and pulmonology 10/20 & 10/21 noted in epic.  Patient reported weight/blood pressure/heart rate are his typical today no concerns.  Denied leg swelling.  Patient notified I would call him back when I had further information from the pharmacist regarding potassium Rx.  Patient A&Ox3 spoke full sentences without difficulty no cough/wheezing/shortness of breath/congestion audible.  HR notified patient has follow up evals scheduled 11 Oct.   Patient verbalized understanding information/instructions,  agreed with plan of care and had no further questions at this time.

## 2021-09-13 NOTE — Telephone Encounter (Addendum)
Reviewed Epic patient given RTW note 09/20/2021 s/p hospital discharge.  Discharge summary "The patient was admitted 09/08/21 per device clinic after alert showed VT storm with multiple episodes requiring VT. Presented to [facility] and decision made to load on Sotalol.  He was monitored on telemetry which demonstrated gradual improvement of his VT. Still with occasional NSVT and PVCs up to bigeminy, but no further sustained VT on sotalol.  Pt required ongoing supplementation for hypokalemia. QTc remained stable when measured manually and corrected for QRS or PVCs. The patient was examined am of 09/12/21 and considered to be stable for discharge. He was determined safe for discharge with low K with increased supp and close recheck as an outpatient given that he already has an ICD implanted. [place] driving guidelines of no driving x 6 months after appropriate ICD therapy reviewed with the patient who verbalized understanding.  The patient will be seen back by  EP APP in 1 week for post hospital care on sotalol."  Will contact patient via telephone this afternoon.

## 2021-09-14 ENCOUNTER — Telehealth: Payer: Self-pay | Admitting: Pulmonary Disease

## 2021-09-14 LAB — ALPHA GALACTOSIDASE: Alpha galactosidase, serum: 38.7 nmol/hr/mg prt (ref >35.5)

## 2021-09-14 NOTE — Telephone Encounter (Signed)
Rec'd fax from [employer] of a Physician's Statement-Progress Report.  Sent to [facility] for completion.  Will need to be signed by Dr. Francine Graven.

## 2021-09-15 NOTE — Telephone Encounter (Signed)
Patient with questions about 09/13/2021 labs again elevated creatinine/BUN.  Patient asking if diclofenac can worsen kidney function.  Notified patient it can strain kidneys.  He asked if tylenol better for kidneys and discussed tylenol typically does not put strain on kidneys.  Patient increased water intake since discharge from hospital.  Feeling well.  Has follow up appts 11 Oct and hoping to be back at work 12 Oct.  Patient stated he would see me in clinic 13 Oct if back onsite at work.  Patient instructed to take his blood pressure medications as prescribed along with potassium supplements.  Patient A&Ox3 spoke full sentences without difficulty verbalized understanding of information/instructions and had no further questions at this time.

## 2021-09-19 ENCOUNTER — Other Ambulatory Visit: Payer: Self-pay

## 2021-09-19 ENCOUNTER — Ambulatory Visit (INDEPENDENT_AMBULATORY_CARE_PROVIDER_SITE_OTHER): Payer: No Typology Code available for payment source | Admitting: Student

## 2021-09-19 ENCOUNTER — Encounter: Payer: Self-pay | Admitting: Student

## 2021-09-19 ENCOUNTER — Ambulatory Visit (INDEPENDENT_AMBULATORY_CARE_PROVIDER_SITE_OTHER): Payer: No Typology Code available for payment source

## 2021-09-19 ENCOUNTER — Encounter: Payer: Self-pay | Admitting: *Deleted

## 2021-09-19 VITALS — BP 132/100 | HR 80 | Ht 70.0 in | Wt 255.8 lb

## 2021-09-19 DIAGNOSIS — I472 Ventricular tachycardia, unspecified: Secondary | ICD-10-CM

## 2021-09-19 DIAGNOSIS — Q2112 Patent foramen ovale: Secondary | ICD-10-CM

## 2021-09-19 DIAGNOSIS — Z4502 Encounter for adjustment and management of automatic implantable cardiac defibrillator: Secondary | ICD-10-CM

## 2021-09-19 DIAGNOSIS — J9611 Chronic respiratory failure with hypoxia: Secondary | ICD-10-CM

## 2021-09-19 DIAGNOSIS — I2510 Atherosclerotic heart disease of native coronary artery without angina pectoris: Secondary | ICD-10-CM

## 2021-09-19 LAB — CUP PACEART INCLINIC DEVICE CHECK
Battery Remaining Longevity: 58 mo
Brady Statistic RA Percent Paced: 93 %
Brady Statistic RV Percent Paced: 53 %
Date Time Interrogation Session: 20221011093907
HighPow Impedance: 60.75 Ohm
Implantable Lead Implant Date: 20220829
Implantable Lead Implant Date: 20220829
Implantable Lead Location: 753859
Implantable Lead Location: 753860
Implantable Pulse Generator Implant Date: 20220829
Lead Channel Impedance Value: 412.5 Ohm
Lead Channel Impedance Value: 487.5 Ohm
Lead Channel Pacing Threshold Amplitude: 0.5 V
Lead Channel Pacing Threshold Amplitude: 0.5 V
Lead Channel Pacing Threshold Amplitude: 0.5 V
Lead Channel Pacing Threshold Amplitude: 0.5 V
Lead Channel Pacing Threshold Pulse Width: 0.5 ms
Lead Channel Pacing Threshold Pulse Width: 0.5 ms
Lead Channel Pacing Threshold Pulse Width: 0.5 ms
Lead Channel Pacing Threshold Pulse Width: 0.5 ms
Lead Channel Sensing Intrinsic Amplitude: 12 mV
Lead Channel Sensing Intrinsic Amplitude: 3.5 mV
Lead Channel Setting Pacing Amplitude: 3.5 V
Lead Channel Setting Pacing Amplitude: 3.5 V
Lead Channel Setting Pacing Pulse Width: 0.5 ms
Lead Channel Setting Sensing Sensitivity: 0.5 mV
Pulse Gen Serial Number: 810029878

## 2021-09-19 NOTE — Patient Instructions (Signed)
Medication Instructions:  Your physician recommends that you continue on your current medications as directed. Please refer to the Current Medication list given to you today.  *If you need a refill on your cardiac medications before your next appointment, please call your pharmacy*   Lab Work: TODAY:  BMET, MAG, & PRO BNP If you have labs (blood work) drawn today and your tests are completely normal, you will receive your results only by: MyChart Message (if you have MyChart) OR A paper copy in the mail If you have any lab test that is abnormal or we need to change your treatment, we will call you to review the results.   Testing/Procedures: None ordered   Follow-Up: At [facility], you and your health needs are our priority.  As part of our continuing mission to provide you with exceptional heart care, we have created designated Provider Care Teams.  These Care Teams include your primary Cardiologist (physician) and Advanced Practice Providers (APPs -  Physician Assistants and Nurse Practitioners) who all work together to provide you with the care you need, when you need it.  We recommend signing up for the patient portal called "MyChart".  Sign up information is provided on this After Visit Summary.  MyChart is used to connect with patients for Virtual Visits (Telemedicine).  Patients are able to view lab/test results, encounter notes, upcoming appointments, etc.  Non-urgent messages can be sent to your provider as well.   To learn more about what you can do with MyChart, go to [email or web address].    Your next appointment:   Keep the appointment already scheduled  The format for your next appointment:   In Person  Provider:      Other Instructions

## 2021-09-19 NOTE — Progress Notes (Signed)
Six Minute Walk - 09/19/21 1644       Six Minute Walk   Medications taken before test (dose and time) Tylenol #3 300-30mg , eliquis 5mg , aspirin 81mg , chlorthalidone 25mg , volteran 75mg , folic acid 1mg , gabapentin 600mg , hydralazine 100mg , losartan potassium 100mg , potassium chloride , prednisone 5mg , sotalol 120mg , spironolactone 25mg  taken at 8am    Supplemental oxygen during test? No    Lap distance in meters  34 meters    Laps Completed 14    Partial lap (in meters) 0 meters    Baseline BP (sitting) 130/90    Baseline Heartrate 80    Baseline Dyspnea (Borg Scale) 3    Baseline Fatigue (Borg Scale) 3    Baseline SPO2 97 %      End of Test Values    BP (sitting) 180/100    Heartrate 107    Dyspnea (Borg Scale) 10    Fatigue (Borg Scale) 10    SPO2 92 %      2 Minutes Post Walk Values   BP (sitting) 160/94    Heartrate 80    SPO2 97 %    Stopped or paused before six minutes? No    Other Symptoms at end of exercise: Dizziness   not a new symptom with that amount of walking     Interpretation   Distance completed 476 meters    Tech Comments: pt walked at a brisk pace, coughed occasionally during the walk. pt was "huffing and puffing" during the walk. pt did not stop/pause during walk. sats remained stable.

## 2021-09-19 NOTE — Telephone Encounter (Signed)
Reviewed Epic pulmonology and cardiology appts today.  Patient 6 minute walk test sp02 RA 92-97%.  Potassium stable.  BP still elevated diastolic 100 today.  No further VT on his monitor download today.  Work excuse note through 29 Sep 2021/he has follow up appt scheduled with specialist for re-evaluation.  HR notified.  Will follow up with patient via telephone tomorrow 09/20/21  BMET Results for Thayne, Echo S (MRN [id number]) as of 09/19/2021 19:23  Ref. Range 09/19/2021 09:38  BASIC METABOLIC PANEL Unknown Rpt  Sodium Latest Ref Range: 134 - 144 mmol/L 140  Potassium Latest Ref Range: 3.5 - 5.2 mmol/L 4.3  Chloride Latest Ref Range: 96 - 106 mmol/L 106  CO2 Latest Ref Range: 20 - 29 mmol/L 21  Glucose Latest Ref Range: 70 - 99 mg/dL 75  BUN Latest Ref Range: 8 - 27 mg/dL 18  Creatinine Latest Ref Range: 0.76 - 1.27 mg/dL 1.22  Calcium Latest Ref Range: 8.6 - 10.2 mg/dL 9.6  BUN/Creatinine Ratio Latest Ref Range: 10 - 24  15  eGFR Latest Ref Range: >59 mL/min/1.73 66  Magnesium Latest Ref Range: 1.6 - 2.3 mg/dL 1.9  NT-Pro BNP Unknown WILL FOLLOW   

## 2021-09-19 NOTE — Progress Notes (Signed)
Electrophysiology Office Note Date: 09/19/2021  ID:  FRANCISCOJAVIER Glass, DOB 11/13/1956, MRN [id number]  PCP: [name], MD Primary Cardiologist: [name], MD Electrophysiologist: [name], MD   CC: Routine ICD follow-up  Ryan Glass is a 65 y.o. male seen today for [name], MD for post hospital follow up.  Since discharge from hospital the patient reports doing OK. He remains SOB with moderate exertion. He goes to Pulmonary today for . He has not had any ICD therapies. Denies edema, lightheadedness, or syncope. BP at home running high, but checking 10-20 minutes after taking am meds. BP relatively stable on arrival here today, approx 1.5 hours after taking meds despite having rushed in.   Device History: St. Jude Dual Chamber ICD implanted 08/07/21 for VT History of appropriate therapy: Yes History of AAD therapy: Yes; currently on sotalol    Past Medical History:  Diagnosis Date   Ascending aortic aneurysm    a. CT 05/2021 showed 4.4x4.4cm TAA.   Bilateral lower extremity edema    Chronic gout without tophus    followed by dr Sharmon Revere    (06-08-2020  per pt last episode right knee 3 wks ago)   CKD (chronic kidney disease), stage II    nephrology--- [facility] PA (10-29-2019 note in epic scanned in  media)   Heart failure with mid-range ejection fraction [facility]) 05/27/2021   05/26/21: Left ventricular ejection fraction, by estimation, is 45 to 50%. There is severe asymmetric left ventricular hypertrophy. G1DD present.    History of COVID-19 06/07/2021   HOCM (hypertrophic obstructive cardiomyopathy) (HCC)    s/p ICD 07/2021   Hypertension    followed by cardiology, dr t. turner   (05-14-2018 nuclear study in epic , normal perfusion with nuclear ef 61%)   Left hydrocele    Low serum potassium level 04/27/2014   OSA on CPAP    per pt uses every night   Palpitations followed by dr t. Mayford Knife   06-08-2020  still feels palipations due to PVCs when  exertion but not with chest pain/ discomfort   Pneumonia due to COVID-19 virus    PVC's (premature ventricular contractions) cardiologist--- dr t. turner   Status post PVC ablation by Dr. Ladona Ridgel 2019 with recurrence of frequent PVCs/bigeminy;  prior pseudobradycardia r/t pvcs   Rheumatoid arthritis involving multiple sites [facility])    rheumotology--- dr a. Sharmon Revere  ([facility] in [place])   Past Surgical History:  Procedure Laterality Date   BUBBLE STUDY  08/18/2021   Procedure: BUBBLE STUDY;  Surgeon: Wendall Stade, MD;  Location: [facility] ENDOSCOPY;  Service: Cardiovascular;;   HYDROCELE EXCISION Left 06/14/2020   Procedure: LEFT  HYDROCELECTOMY ADULT;  Surgeon: Noel Christmas, MD;  Location: [facility];  Service: Urology;  Laterality: Left;   ICD IMPLANT N/A 08/07/2021   Procedure: ICD IMPLANT;  Surgeon: Marinus Maw, MD;  Location: [facility] INVASIVE CV LAB;  Service: Cardiovascular;  Laterality: N/A;   INCISIONAL HERNIA REPAIR  02-23-2016   @[facility]    LAPAROSCOPIC   LAPAROSCOPIC INGUINAL HERNIA REPAIR Bilateral 08-22-2015  @[facility]    AND UMBILICAL HERNIA REPAIR   PVC ABLATION N/A 10/07/2018   Procedure: PVC ABLATION;  Surgeon: , MD;  Location: [facility] INVASIVE CV LAB;  Service: Cardiovascular;  Laterality: N/A;   RIGHT/LEFT HEART CATH AND CORONARY ANGIOGRAPHY N/A 05/29/2021   Procedure: RIGHT/LEFT HEART CATH AND CORONARY ANGIOGRAPHY;  Surgeon: Marinus Maw, MD;  Location: [facility] INVASIVE CV LAB;  Service:  Cardiovascular;  Laterality: N/A;   TEE WITHOUT CARDIOVERSION N/A 08/18/2021   Procedure: TRANSESOPHAGEAL ECHOCARDIOGRAM (TEE);  Surgeon: Wendall Stade, MD;  Location: [facility] ENDOSCOPY;  Service: Cardiovascular;  Laterality: N/A;   UMBILICAL HERNIA REPAIR  child    Current Outpatient Medications  Medication Sig Dispense Refill   acetaminophen-codeine (TYLENOL #3) 300-30 MG tablet Take 1-2 tablets by mouth every 12 (twelve) hours as needed for moderate pain.     albuterol  (VENTOLIN HFA) 108 (90 Base) MCG/ACT inhaler Inhale 2 puffs into the lungs every 4 (four) hours as needed for wheezing or shortness of breath. (Patient taking differently: Inhale 2 puffs into the lungs See admin instructions. Inhale 2 puffs into the lungs at bedtime and an additional 2 puffs every 6 hours as needed for shortness of breath or wheezing) 8 g 2   apixaban (ELIQUIS) 5 MG TABS tablet Take 1 tablet (5 mg total) by mouth 2 (two) times daily. 60 tablet 5   aspirin EC 81 MG tablet Take 1 tablet (81 mg total) by mouth daily. Swallow whole. 90 tablet 3   atorvastatin (LIPITOR) 40 MG tablet Take 1 tablet (40 mg total) by mouth daily. (Patient taking differently: Take 40 mg by mouth at bedtime.) 90 tablet 3   chlorthalidone (HYGROTON) 25 MG tablet Take 25 mg by mouth at bedtime.     colchicine 0.6 MG tablet Take 1-2 tablets by mouth daily as needed (for gout flares).     diclofenac (VOLTAREN) 75 MG EC tablet Take 75 mg by mouth in the morning.     fluticasone (FLONASE) 50 MCG/ACT nasal spray Place 1 spray into both nostrils daily. (Patient taking differently: Place 2 sprays into both nostrils at bedtime.)     Fluticasone-Salmeterol (ADVAIR) 250-50 MCG/DOSE AEPB Inhale 1 puff into the lungs every 12 (twelve) hours. 60 each 11   folic acid (FOLVITE) 1 MG tablet Take 1 mg by mouth daily.     furosemide (LASIX) 40 MG tablet Take 1 tablet (40 mg total) by mouth as needed. Take 1 tablet by mouth daily for 3 days, then decrease to only as needed for increase weight gain (Patient taking differently: Take 40 mg by mouth See admin instructions. Take 40 mg by mouth daily for 3 days, then decrease to only as needed for increase weight gain) 30 tablet 3   gabapentin (NEURONTIN) 600 MG tablet Take 600 mg by mouth with breakfast, with lunch, and with evening meal.     HUMIRA PEN 40 MG/0.4ML PNKT Inject 40 mg into the skin every 14 (fourteen) days.     hydrALAZINE (APRESOLINE) 100 MG tablet Take 1 tablet (100 mg  total) by mouth every 8 (eight) hours. 90 tablet 6   losartan (COZAAR) 100 MG tablet Take 1 tablet (100 mg total) by mouth daily. 90 tablet 3   methotrexate 2.5 MG tablet Take 15 mg by mouth every Sunday.     montelukast (SINGULAIR) 10 MG tablet Take 1 tablet (10 mg total) by mouth at bedtime. 90 tablet 3   potassium chloride SA (KLOR-CON) 20 MEQ tablet Take 2 tablets (40 mEq total) by mouth 2 (two) times daily. 120 tablet 6   predniSONE (DELTASONE) 5 MG tablet Take 5 mg by mouth daily with breakfast.     sildenafil (REVATIO) 20 MG tablet Take 20 mg by mouth daily as needed (erectile dysfunction).     sotalol (BETAPACE) 120 MG tablet Take 1 tablet (120 mg total) by mouth every 12 (twelve) hours. 60  tablet 6   spironolactone (ALDACTONE) 25 MG tablet Take 1 tablet (25 mg total) by mouth daily. 30 tablet 6   No current facility-administered medications for this visit.    Allergies:   Amlodipine besylate and Aspirin   Social History: Social History   Socioeconomic History   Marital status: Married    Spouse name: Not on file   Number of children: Not on file   Years of education: Not on file   Highest education level: Not on file  Occupational History   Not on file  Tobacco Use   Smoking status: Never   Smokeless tobacco: Never  Vaping Use   Vaping Use: Never used  Substance and Sexual Activity   Alcohol use: Yes    Alcohol/week: 5.0 - 7.0 standard drinks    Types: 5 - 7 Cans of beer per week   Drug use: Never   Sexual activity: Not on file  Other Topics Concern   Not on file  Social History Narrative   Not on file   Social Determinants of Health   Financial Resource Strain: Not on file  Food Insecurity: Not on file  Transportation Needs: Not on file  Physical Activity: Not on file  Stress: Not on file  Social Connections: Not on file  Intimate Partner Violence: Not on file    Family History: Family History  Problem Relation Age of Onset   Cardiomyopathy Mother     Heart attack Father     Review of Systems: All other systems reviewed and are otherwise negative except as noted above.   Physical Exam: Vitals:   09/19/21 0911  BP: (!) 132/100  Pulse: 80  SpO2: 97%  Weight: 255 lb 12.8 oz (116 kg)  Height: 5\' 10"  (1.778 m)     GEN- The patient is well appearing, alert and oriented x 3 today.   HEENT: normocephalic, atraumatic; sclera clear, conjunctiva pink; hearing intact; oropharynx clear; neck supple, no JVP Lymph- no cervical lymphadenopathy Lungs- Clear to ausculation bilaterally, normal work of breathing.  No wheezes, rales, rhonchi Heart- Regular rate and rhythm, no murmurs, rubs or gallops, PMI not laterally displaced GI- soft, non-tender, non-distended, bowel sounds present, no hepatosplenomegaly Extremities- no clubbing or cyanosis. No edema; DP/PT/radial pulses 2+ bilaterally MS- no significant deformity or atrophy Skin- warm and dry, no rash or lesion; ICD pocket well healed Psych- euthymic mood, full affect Neuro- strength and sensation are intact  ICD interrogation- reviewed in detail today,  See PACEART report  EKG:  EKG is ordered today. Personal review of EKG ordered today shows NSR at 80 bpm with RBBB. QTc stable on sotalol.   Recent Labs: 02/06/2021: NT-Pro BNP 47 06/06/2021: TSH 0.975 09/08/2021: ALT 30; B Natriuretic Peptide 116.3; Hemoglobin 14.0; Platelets 142 09/12/2021: Magnesium 2.0 09/13/2021: BUN 39; Creatinine, Ser 1.90; Potassium 4.7; Sodium 137   Wt Readings from Last 3 Encounters:  09/19/21 255 lb 12.8 oz (116 kg)  09/12/21 245 lb 12.8 oz (111.5 kg)  09/05/21 254 lb 3.2 oz (115.3 kg)     Other studies Reviewed: Additional studies/ records that were reviewed today include: Previous EP office notes.   Assessment and Plan:  1.  VT s/p St. Jude dual chamber ICD  euvolemic today Stable on an appropriate medical regimen Normal ICD function See Pace Art report No changes today. QT stable on sotalol 120  mg BID. No further VT. Keep K > 4.0 and Mg > 2.0    2. HTN Stable on current  regimen    3. HCM with asymmetric septal hypertrophy Recommend outpatient consultation with Dr. Jomarie Longs   4. PAF On eliqius for CHA2DS2/VASc of at least 3   5. Hypokalemia BMET today.    Current medicines are reviewed at length with the patient today.    Labs/ tests ordered today include:  Orders Placed This Encounter  Procedures   Basic metabolic panel   Magnesium   Pro b natriuretic peptide (BNP)   EKG 12-Lead    Disposition:   Follow up with Dr. Ladona Ridgel as scheduled in December.    Dustin Flock, PA-C  09/19/2021 9:35 AM  [facility] [address] [place] [address] [phone] (office) [phone] (fax)

## 2021-09-20 ENCOUNTER — Telehealth: Payer: Self-pay | Admitting: Internal Medicine

## 2021-09-20 LAB — MAGNESIUM: Magnesium: 1.9 mg/dL (ref 1.6–2.3)

## 2021-09-20 LAB — BASIC METABOLIC PANEL
BUN/Creatinine Ratio: 15 (ref 10–24)
BUN: 18 mg/dL (ref 8–27)
CO2: 21 mmol/L (ref 20–29)
Calcium: 9.6 mg/dL (ref 8.6–10.2)
Chloride: 106 mmol/L (ref 96–106)
Creatinine, Ser: 1.22 mg/dL (ref 0.76–1.27)
Glucose: 75 mg/dL (ref 70–99)
Potassium: 4.3 mmol/L (ref 3.5–5.2)
Sodium: 140 mmol/L (ref 134–144)
eGFR: 66 mL/min/{1.73_m2} (ref >59)

## 2021-09-20 LAB — PRO B NATRIURETIC PEPTIDE: NT-Pro BNP: 355 pg/mL (ref 0–376)

## 2021-09-20 NOTE — Telephone Encounter (Signed)
Noted in Epic cardiology completed RTW note for 25 Sep 2021.with no restrictions.  Attempted to reach patient via telephone tonight to verify if he plans to return 17 Oct per cardiology or waiting until after pulmonary follow up 09/28/21 with Dr Francine Graven.

## 2021-09-20 NOTE — Telephone Encounter (Signed)
Form completed by Dr. Ladona Ridgel and faxed to [facility] on 09/20/2021. Patient notied and ready for pick up. JMM 09/20/2021

## 2021-09-21 ENCOUNTER — Other Ambulatory Visit: Payer: Self-pay

## 2021-09-21 DIAGNOSIS — R0602 Shortness of breath: Secondary | ICD-10-CM

## 2021-09-21 MED ORDER — MAGNESIUM OXIDE 400 MG PO CAPS: 400.0000 mg | ORAL_CAPSULE | Freq: Every day | ORAL | 3 refills | Status: DC

## 2021-09-21 NOTE — Telephone Encounter (Signed)
Patient returned call stated not planning to come back onsite at work until cleared to return by pulmonology 09/29/21.at the earliest.  Completed his 6 minute walk test this week.  Feeling better blood pressure still elevated but breathing and palpitations have improved with sotalol use. Able to do more activities at home this past week. Patient was instructed by cardiology to take blood pressure later in the day and not just an hour or two after taking his medications.  He is to send in his blood pressure log next week.  Patient looking forward to restarting work again.  No further questions or concerns at this time.  A&Ox3 spoke full sentences without difficulty no cough/wheezing/shortness of breath/throat clearing or congestion audible during 6 minute telephone call.

## 2021-09-27 ENCOUNTER — Encounter: Payer: Self-pay | Admitting: Registered Nurse

## 2021-09-27 ENCOUNTER — Telehealth: Payer: Self-pay | Admitting: Registered Nurse

## 2021-09-27 DIAGNOSIS — I10 Essential (primary) hypertension: Secondary | ICD-10-CM

## 2021-09-27 NOTE — Telephone Encounter (Signed)
Patient contacted NP spironolactone 25mg  po daily did not have any refills when he contacted automated line.  Upon epic review PA [place] had placed new Rx for patient at last office visit 09/12/21.  Discussed with patient probably just has a new Rx number so automated line didn't recognize patient request as authorized.  Contacted pharmacy staff who verified patient has active Rx and will fill for him and will be available for pick up with sotalol.  Patient contacted via telephone again notified new Rx is being processed and to ensure he uses new Rx number with automated line for refill request next month.  Patient verbalized understanding information/instructions, agreed with plan of care and had no further questions at this time.

## 2021-09-28 ENCOUNTER — Other Ambulatory Visit (HOSPITAL_COMMUNITY): Payer: Self-pay

## 2021-09-28 ENCOUNTER — Encounter: Payer: Self-pay | Admitting: Pulmonary Disease

## 2021-09-28 ENCOUNTER — Other Ambulatory Visit: Payer: Self-pay

## 2021-09-28 ENCOUNTER — Ambulatory Visit (INDEPENDENT_AMBULATORY_CARE_PROVIDER_SITE_OTHER): Payer: No Typology Code available for payment source | Admitting: Pulmonary Disease

## 2021-09-28 VITALS — BP 134/86 | HR 80 | Ht 70.0 in | Wt 255.2 lb

## 2021-09-28 DIAGNOSIS — I2699 Other pulmonary embolism without acute cor pulmonale: Secondary | ICD-10-CM | POA: Diagnosis not present

## 2021-09-28 DIAGNOSIS — G4733 Obstructive sleep apnea (adult) (pediatric): Secondary | ICD-10-CM

## 2021-09-28 DIAGNOSIS — J4520 Mild intermittent asthma, uncomplicated: Secondary | ICD-10-CM

## 2021-09-28 DIAGNOSIS — I5022 Chronic systolic (congestive) heart failure: Secondary | ICD-10-CM

## 2021-09-28 MED ORDER — FLUTICASONE FUROATE-VILANTEROL 100-25 MCG/ACT IN AEPB: 1.0000 | INHALATION_SPRAY | Freq: Every day | RESPIRATORY_TRACT | 5 refills | Status: DC

## 2021-09-28 NOTE — Patient Instructions (Signed)
Use breo ellipta 1 puff daily  Continue to use albuterol as needed  You are safe to return to work with out restrictions  Please follow up with [place] after you have your sleep study. You can call to schedule a follow up appointment 1 week after your study is completed.

## 2021-09-28 NOTE — Progress Notes (Signed)
Synopsis: Referred by Bernadette Hoit, MD for post-covid 19 dyspnea  Subjective:   PATIENT ID: Ryan Glass GENDER: male DOB: 1956-07-07, MRN: [id number]  HPI  Chief Complaint  Patient presents with   Follow-up    1 mo f/u. States he has been doing well since last visit.    Artez Regis is a 65 year old male, never smoker with hypertension, rheumatoid arthritis and obstructive sleep apnea on CPAP who returns to pulmonary clinic shortness of breath.   Patient is feeling better since last visit and he has been transition to sotalol therapy and remains off carvedilol and diltiazem due to bradycardia.  He has been followed closely by his cardiology team and seems to be in a better spot at this time regarding his cardiac function.  He continues to have shortness of breath with exertion though.  He has been taken off supplemental oxygen therapy based on his recent clinic checks with no desaturations upon ambulation.  OV 09/05/21 He was admitted in June 2022 for progressive dyspnea and chest pain where he was found to have a segmental pulmonary emboli after traveling to [place] and having dental implant surgery done. He was treated with heparin and then transitioned to eliquis. He was discharged home on supplemental oxygen as he had desaturations in his SpO2 at that time.   Echo 05/26/21 showed LVEF 45-50% (down from 60-65% 02/08/21) with severe asymetric left ventricular hypertrophy. Grade I diastolic dysfunction. RV systolic function is moderately reduced and RV size is moderately enlarged with RVSP 50.24mmHg. His echo on 02/08/21 previously showed normal RV function and mildly enlarged size. There is dilation in the RA and LA on both the 6/17 and 3/2 echos.   LHC 05/29/2021 showed minimal CAD and RHC showed normal pressures. RV 26/1, PA 25/4, PA mean 14, PW mean 12, LVEDP 8.  HRCT Chest on 06/07/21 shows band like scarring or partial atelectasis of the dependent lungs which is unchanged from prior  studies. No air trapping noted. No subpleural reticulation noted.   PFTs 11/2020 normal ratio, no airway obstruction, no restriction, DLCO 71%. Repeat PFTs 08/01/21 show normal ratio, no obstruction or restriction and DLCO 89%.   He again had oxygen desaturations and required 2L of supplemental oxygen with ambulation at last visit on 07/20/21.   He was admitted 08/02/21 for concerns of ventricular trachycardia noted on his Zio extended cardiac monitor. TTE bubble study on 08/03/21 showed positive shunting within 3-6 cardiac cycles suggestive of interatrial shunt. The echo was also concerning for hypertrophic cardiomyopathy. He under went ICD placement on 08/07/21. He was discharged home with carvedilol and verapamil for rate control medications.  Discussions were held between his cardiology team and myself regarding his dyspnea out of proportion to his testing results in regards to his HRCT chest scan and recent PFTs. We decided to move forward with closure of his PFO as he had shunt activity noted on TTE and TEE.  Today in clinic patient did not have desaturations in his SpO2 which is similar to Dr. Earmon Phoenix findings in clinic on 08/24/21. The patient walked a total of 5 laps without decline in his O2 today. Patient's heart rate did drop into the 40s with increased activity. Patient has HR, BP and SpO2 diary which indicates he has had a heart rate in the 30-40s since 9/16-9/17. He does complain of dizziness and feeling light headed at times.   Past Medical History:  Diagnosis Date   Ascending aortic aneurysm    a.  CT 05/2021 showed 4.4x4.4cm TAA.   Bilateral lower extremity edema    Chronic gout without tophus    followed by dr Sharmon Revere    (06-08-2020  per pt last episode right knee 3 wks ago)   CKD (chronic kidney disease), stage II    nephrology--- [facility] (10-29-2019 note in epic scanned in  media)   Heart failure with mid-range ejection fraction [facility]) 05/27/2021   05/26/21: Left  ventricular ejection fraction, by estimation, is 45 to 50%. There is severe asymmetric left ventricular hypertrophy. G1DD present.    History of COVID-19 06/07/2021   HOCM (hypertrophic obstructive cardiomyopathy) (HCC)    s/p ICD 07/2021   Hypertension    followed by cardiology, dr t. turner   (05-14-2018 nuclear study in epic , normal perfusion with nuclear ef 61%)   Left hydrocele    Low serum potassium level 04/27/2014   OSA on CPAP    per pt uses every night   Palpitations followed by dr t. Mayford Knife   06-08-2020  still feels palipations due to PVCs when exertion but not with chest pain/ discomfort   Pneumonia due to COVID-19 virus    PVC's (premature ventricular contractions) cardiologist--- dr t. turner   Status post PVC ablation by Dr. Ladona Ridgel 2019 with recurrence of frequent PVCs/bigeminy;  prior pseudobradycardia r/t pvcs   Rheumatoid arthritis involving multiple sites [facility])    rheumotology--- dr a. Sharmon Revere  ([facility] in [place])     Family History  Problem Relation Age of Onset   Cardiomyopathy Mother    Heart attack Father      Social History   Socioeconomic History   Marital status: Married    Spouse name: Not on file   Number of children: Not on file   Years of education: Not on file   Highest education level: Not on file  Occupational History   Not on file  Tobacco Use   Smoking status: Never   Smokeless tobacco: Never  Vaping Use   Vaping Use: Never used  Substance and Sexual Activity   Alcohol use: Yes    Alcohol/week: 5.0 - 7.0 standard drinks    Types: 5 - 7 Cans of beer per week   Drug use: Never   Sexual activity: Not on file  Other Topics Concern   Not on file  Social History Narrative   Not on file   Social Determinants of Health   Financial Resource Strain: Not on file  Food Insecurity: Not on file  Transportation Needs: Not on file  Physical Activity: Not on file  Stress: Not on file  Social Connections: Not on file  Intimate Partner Violence:  Not on file     Allergies  Allergen Reactions   Amlodipine Besylate Swelling and Other (See Comments)    Leg swelling with 10mg  daily am dosing; decreased with BID 5mg  dosing   Aspirin Itching     Outpatient Medications Prior to Visit  Medication Sig Dispense Refill   acetaminophen-codeine (TYLENOL #3) 300-30 MG tablet Take 1-2 tablets by mouth every 12 (twelve) hours as needed for moderate pain.     albuterol (VENTOLIN HFA) 108 (90 Base) MCG/ACT inhaler Inhale 2 puffs into the lungs every 4 (four) hours as needed for wheezing or shortness of breath. (Patient taking differently: Inhale 2 puffs into the lungs See admin instructions. Inhale 2 puffs into the lungs at bedtime and an additional 2 puffs every 6 hours as needed for shortness of breath or wheezing) 8  g 2   apixaban (ELIQUIS) 5 MG TABS tablet Take 1 tablet (5 mg total) by mouth 2 (two) times daily. 60 tablet 5   aspirin EC 81 MG tablet Take 1 tablet (81 mg total) by mouth daily. Swallow whole. 90 tablet 3   atorvastatin (LIPITOR) 40 MG tablet Take 1 tablet (40 mg total) by mouth daily. (Patient taking differently: Take 40 mg by mouth at bedtime.) 90 tablet 3   chlorthalidone (HYGROTON) 25 MG tablet Take 25 mg by mouth at bedtime.     colchicine 0.6 MG tablet Take 1-2 tablets by mouth daily as needed (for gout flares).     diclofenac (VOLTAREN) 75 MG EC tablet Take 75 mg by mouth in the morning.     fluticasone (FLONASE) 50 MCG/ACT nasal spray Place 1 spray into both nostrils daily. (Patient taking differently: Place 2 sprays into both nostrils at bedtime.)     folic acid (FOLVITE) 1 MG tablet Take 1 mg by mouth daily.     furosemide (LASIX) 40 MG tablet Take 1 tablet (40 mg total) by mouth as needed. Take 1 tablet by mouth daily for 3 days, then decrease to only as needed for increase weight gain (Patient taking differently: Take 40 mg by mouth See admin instructions. Take 40 mg by mouth daily for 3 days, then decrease to only as needed  for increase weight gain) 30 tablet 3   gabapentin (NEURONTIN) 600 MG tablet Take 600 mg by mouth with breakfast, with lunch, and with evening meal.     HUMIRA PEN 40 MG/0.4ML PNKT Inject 40 mg into the skin every 14 (fourteen) days.     hydrALAZINE (APRESOLINE) 100 MG tablet Take 1 tablet (100 mg total) by mouth every 8 (eight) hours. 90 tablet 6   losartan (COZAAR) 100 MG tablet Take 1 tablet (100 mg total) by mouth daily. 90 tablet 3   Magnesium Oxide 400 MG CAPS Take 1 capsule (400 mg total) by mouth daily. 90 capsule 3   methotrexate 2.5 MG tablet Take 15 mg by mouth every Sunday.     montelukast (SINGULAIR) 10 MG tablet Take 1 tablet (10 mg total) by mouth at bedtime. 90 tablet 3   potassium chloride SA (KLOR-CON) 20 MEQ tablet Take 2 tablets (40 mEq total) by mouth 2 (two) times daily. 120 tablet 6   predniSONE (DELTASONE) 5 MG tablet Take 5 mg by mouth daily with breakfast.     sildenafil (REVATIO) 20 MG tablet Take 20 mg by mouth daily as needed (erectile dysfunction).     sotalol (BETAPACE) 120 MG tablet Take 1 tablet (120 mg total) by mouth every 12 (twelve) hours. 60 tablet 6   spironolactone (ALDACTONE) 25 MG tablet Take 1 tablet (25 mg total) by mouth daily. 30 tablet 6   Fluticasone-Salmeterol (ADVAIR) 250-50 MCG/DOSE AEPB Inhale 1 puff into the lungs every 12 (twelve) hours. 60 each 11   No facility-administered medications prior to visit.    Review of Systems  Constitutional:  Negative for chills, fever, malaise/fatigue and weight loss.  HENT:  Negative for congestion and sore throat.   Eyes: Negative.   Respiratory:  Positive for shortness of breath. Negative for cough, hemoptysis, sputum production and wheezing.   Cardiovascular:  Negative for chest pain, palpitations, orthopnea, claudication and leg swelling.  Gastrointestinal:  Negative for abdominal pain, heartburn, nausea and vomiting.  Genitourinary: Negative.   Musculoskeletal: Negative.   Neurological:  Negative  for dizziness, weakness and headaches.  Endo/Heme/Allergies: Negative.  Psychiatric/Behavioral: Negative.     Objective:   Vitals:   09/28/21 1633  BP: 134/86  Pulse: 80  SpO2: 96%  Weight: 255 lb 3.2 oz (115.8 kg)  Height: 5\' 10"  (1.778 m)    Physical Exam Constitutional:      General: He is not in acute distress.    Appearance: He is obese.  HENT:     Head: Normocephalic and atraumatic.  Eyes:     Conjunctiva/sclera: Conjunctivae normal.  Cardiovascular:     Rate and Rhythm: Regular rhythm. Bradycardia present.     Pulses: Normal pulses.     Heart sounds: Normal heart sounds. No murmur heard. Pulmonary:     Effort: Pulmonary effort is normal.     Breath sounds: Normal breath sounds. No wheezing, rhonchi or rales.  Abdominal:     General: Bowel sounds are normal.     Palpations: Abdomen is soft.  Musculoskeletal:     Right lower leg: No edema.     Left lower leg: No edema.  Skin:    General: Skin is warm and dry.  Neurological:     General: No focal deficit present.     Mental Status: He is alert.  Psychiatric:        Mood and Affect: Mood normal.        Behavior: Behavior normal.        Thought Content: Thought content normal.        Judgment: Judgment normal.    CBC    Component Value Date/Time   WBC 6.2 09/08/2021 1200   RBC 4.06 (L) 09/08/2021 1200   HGB 14.0 09/08/2021 1200   HGB 13.8 08/15/2021 1052   HCT 41.2 09/08/2021 1200   HCT 40.4 08/15/2021 1052   PLT 142 (L) 09/08/2021 1200   PLT 193 08/15/2021 1052   MCV 101.5 (H) 09/08/2021 1200   MCV 99 (H) 08/15/2021 1052   MCH 34.5 (H) 09/08/2021 1200   MCHC 34.0 09/08/2021 1200   RDW 13.9 09/08/2021 1200   RDW 13.6 08/15/2021 1052   LYMPHSABS 1.2 09/08/2021 1200   LYMPHSABS 0.8 02/20/2021 1215   MONOABS 0.4 09/08/2021 1200   EOSABS 0.1 09/08/2021 1200   EOSABS 0.0 02/20/2021 1215   BASOSABS 0.0 09/08/2021 1200   BASOSABS 0.0 02/20/2021 1215   BMP Latest Ref Rng & Units 09/19/2021 09/13/2021  09/12/2021  Glucose 70 - 99 mg/dL 75 86 77  BUN 8 - 27 mg/dL 18 11/12/2021) 23  Creatinine 0.76 - 1.27 mg/dL 16(B 8.46) 6.59(D)  BUN/Creat Ratio 10 - 24 15 21  -  Sodium 134 - 144 mmol/L 140 137 134(L)  Potassium 3.5 - 5.2 mmol/L 4.3 4.7 3.3(L)  Chloride 96 - 106 mmol/L 106 103 104  CO2 20 - 29 mmol/L 21 18(L) 21(L)  Calcium 8.6 - 10.2 mg/dL 9.6 9.7 9.6   Chest imaging: HRCT Chest 06/07/21 1. Bland appearing, bandlike scarring and or partial atelectasis of the dependent lungs, unchanged compared to prior examination. No evidence of fibrotic interstitial lung disease. No significant air trapping on expiratory phase imaging. 2. Cardiomegaly and coronary artery disease. 3. Unchanged enlargement of the tubular ascending thoracic aorta, measuring up to 4.4 x 4.4 cm. Recommend annual imaging followup by CTA or MRA if not otherwise imaged.  CXR 07/12/20 There are low lung volumes with central vascular crowding and additional bandlike opacities in the periphery of the left mid lung which could reflect subsegmental atelectasis or scarring. More patchy right infrahilar opacity with airways thickening  is present. No pneumothorax or visible effusion. Cardiomediastinal contours are unremarkable. No acute osseous or soft tissue abnormality. Degenerative changes are present in the imaged spine and shoulders. Telemetry leads overlie the chest.  PFT: PFT Results Latest Ref Rng & Units 08/01/2021 11/16/2020  FVC-Pre L 3.75 4.14  FVC-Predicted Pre % 84 92  FVC-Post L 3.65 4.01  FVC-Predicted Post % 82 89  Pre FEV1/FVC % % 78 82  Post FEV1/FCV % % 82 85  FEV1-Pre L 2.91 3.38  FEV1-Predicted Pre % 87 101  FEV1-Post L 2.99 3.40  DLCO uncorrected ml/min/mmHg 23.24 18.55  DLCO UNC% % 89 71  DLCO corrected ml/min/mmHg 25.93 18.55  DLCO COR %Predicted % 99 71  DLVA Predicted % 100 86  TLC L 6.95 7.23  TLC % Predicted % 102 106  RV % Predicted % 126 121  PFT 2021: Mild diffusion defect.  PFT 2022:  within normal limits  Echo:  08/18/21 - TEE LVEF 55-60%. Mild left ventricular hypertrophy. RV systolic function is moderately reduced. RV is moderately enlarged. Atrial level shunt noted by color flow and agitated saline. RA mildly dilated.   05/26/21 LVEF 45 to 50%.  Severe asymmetric left ventricular hypertrophy.  Grade 1 diastolic dysfunction.  RV systolic function is moderately reduced.  RV size is moderately enlarged.  Moderately elevated pulmonary arterial systolic pressure.  Left atrium size is moderately dilated.  Right atrial size is mildly dilated.  02/08/21 LVEF 60-65%. Moderate LVH. RV systolic function is normal. RV is mildly enlarged.      Assessment & Plan:   Pulmonary embolism, other, unspecified chronicity, unspecified whether acute cor pulmonale present (HCC)  OSA (obstructive sleep apnea)  Heart failure with mid-range ejection fraction (HCC)  Mild intermittent reactive airway disease without complication - Plan: fluticasone furoate-vilanterol (BREO ELLIPTA) 100-25 MCG/ACT AEPB  Discussion: Dyllen Menning is a 65 year old male, never smoker with hypertension, rheumatoid arthritis and obstructive sleep apnea on CPAP who returns to pulmonary clinic for shortness of breath.   He continues to have ongoing exertional shortness of breath which is likely due to his heart failure and deconditioning at this time.  We will continue ICS/LABA inhaler therapy for reactive airways disease as he does report intermittent wheezing and benefit from these inhalers.  He is working on rescheduling his CPAP titration study and he is to call our office after this test is performed and we will set up a follow-up visit to review these results.  He will benefit from cardiopulmonary rehab and is currently on the waiting list.  He has a stationary bike and elliptical machine at home I encouraged him to start working on increasing his physical activity slowly.  Melody Comas, MD  Pulmonary  & Critical Care Office: [phone]     Current Outpatient Medications:    acetaminophen-codeine (TYLENOL #3) 300-30 MG tablet, Take 1-2 tablets by mouth every 12 (twelve) hours as needed for moderate pain., Disp: , Rfl:    albuterol (VENTOLIN HFA) 108 (90 Base) MCG/ACT inhaler, Inhale 2 puffs into the lungs every 4 (four) hours as needed for wheezing or shortness of breath. (Patient taking differently: Inhale 2 puffs into the lungs See admin instructions. Inhale 2 puffs into the lungs at bedtime and an additional 2 puffs every 6 hours as needed for shortness of breath or wheezing), Disp: 8 g, Rfl: 2   apixaban (ELIQUIS) 5 MG TABS tablet, Take 1 tablet (5 mg total) by mouth 2 (two) times daily., Disp: 60 tablet, Rfl:  5   aspirin EC 81 MG tablet, Take 1 tablet (81 mg total) by mouth daily. Swallow whole., Disp: 90 tablet, Rfl: 3   atorvastatin (LIPITOR) 40 MG tablet, Take 1 tablet (40 mg total) by mouth daily. (Patient taking differently: Take 40 mg by mouth at bedtime.), Disp: 90 tablet, Rfl: 3   chlorthalidone (HYGROTON) 25 MG tablet, Take 25 mg by mouth at bedtime., Disp: , Rfl:    colchicine 0.6 MG tablet, Take 1-2 tablets by mouth daily as needed (for gout flares)., Disp: , Rfl:    diclofenac (VOLTAREN) 75 MG EC tablet, Take 75 mg by mouth in the morning., Disp: , Rfl:    fluticasone (FLONASE) 50 MCG/ACT nasal spray, Place 1 spray into both nostrils daily. (Patient taking differently: Place 2 sprays into both nostrils at bedtime.), Disp: , Rfl:    fluticasone furoate-vilanterol (BREO ELLIPTA) 100-25 MCG/ACT AEPB, Inhale 1 puff into the lungs daily., Disp: 30 each, Rfl: 5   folic acid (FOLVITE) 1 MG tablet, Take 1 mg by mouth daily., Disp: , Rfl:    furosemide (LASIX) 40 MG tablet, Take 1 tablet (40 mg total) by mouth as needed. Take 1 tablet by mouth daily for 3 days, then decrease to only as needed for increase weight gain (Patient taking differently: Take 40 mg by mouth See admin  instructions. Take 40 mg by mouth daily for 3 days, then decrease to only as needed for increase weight gain), Disp: 30 tablet, Rfl: 3   gabapentin (NEURONTIN) 600 MG tablet, Take 600 mg by mouth with breakfast, with lunch, and with evening meal., Disp: , Rfl:    HUMIRA PEN 40 MG/0.4ML PNKT, Inject 40 mg into the skin every 14 (fourteen) days., Disp: , Rfl:    hydrALAZINE (APRESOLINE) 100 MG tablet, Take 1 tablet (100 mg total) by mouth every 8 (eight) hours., Disp: 90 tablet, Rfl: 6   losartan (COZAAR) 100 MG tablet, Take 1 tablet (100 mg total) by mouth daily., Disp: 90 tablet, Rfl: 3   Magnesium Oxide 400 MG CAPS, Take 1 capsule (400 mg total) by mouth daily., Disp: 90 capsule, Rfl: 3   methotrexate 2.5 MG tablet, Take 15 mg by mouth every Sunday., Disp: , Rfl:    montelukast (SINGULAIR) 10 MG tablet, Take 1 tablet (10 mg total) by mouth at bedtime., Disp: 90 tablet, Rfl: 3   potassium chloride SA (KLOR-CON) 20 MEQ tablet, Take 2 tablets (40 mEq total) by mouth 2 (two) times daily., Disp: 120 tablet, Rfl: 6   predniSONE (DELTASONE) 5 MG tablet, Take 5 mg by mouth daily with breakfast., Disp: , Rfl:    sildenafil (REVATIO) 20 MG tablet, Take 20 mg by mouth daily as needed (erectile dysfunction)., Disp: , Rfl:    sotalol (BETAPACE) 120 MG tablet, Take 1 tablet (120 mg total) by mouth every 12 (twelve) hours., Disp: 60 tablet, Rfl: 6   spironolactone (ALDACTONE) 25 MG tablet, Take 1 tablet (25 mg total) by mouth daily., Disp: 30 tablet, Rfl: 6

## 2021-09-29 ENCOUNTER — Encounter: Payer: Self-pay | Admitting: Pulmonary Disease

## 2021-09-29 ENCOUNTER — Ambulatory Visit (HOSPITAL_BASED_OUTPATIENT_CLINIC_OR_DEPARTMENT_OTHER): Payer: No Typology Code available for payment source | Admitting: Pulmonary Disease

## 2021-09-30 ENCOUNTER — Encounter: Payer: Self-pay | Admitting: Registered Nurse

## 2021-09-30 ENCOUNTER — Telehealth: Payer: Self-pay | Admitting: Registered Nurse

## 2021-09-30 DIAGNOSIS — R0609 Other forms of dyspnea: Secondary | ICD-10-CM

## 2021-09-30 NOTE — Telephone Encounter (Signed)
Patient had pulmonology follow up yesterday and cleared to return to work.  He is off supplemental oxygen but still having shortness of breath with exertion and referral for cardiopulmonary rehab placed and patient on waiting list.  Cardiology follow up labs 10/04/21 and appt 10/11/21 with Dr [name].      Results for Ryan Glass, Ryan Glass (MRN [id number]) as of 09/30/2021 17:16  Ref. Range 09/13/2021 11:20 09/19/2021 09:38  Sodium Latest Ref Range: 134 - 144 mmol/L 137 140  Potassium Latest Ref Range: 3.5 - 5.2 mmol/L 4.7 4.3  Chloride Latest Ref Range: 96 - 106 mmol/L 103 106  CO2 Latest Ref Range: 20 - 29 mmol/L 18 (L) 21  Glucose Latest Ref Range: 70 - 99 mg/dL 86 75  BUN Latest Ref Range: 8 - 27 mg/dL 39 (H) 18  Creatinine Latest Ref Range: 0.76 - 1.27 mg/dL 1.90 (H) 1.22  Calcium Latest Ref Range: 8.6 - 10.2 mg/dL 9.7 9.6  BUN/Creatinine Ratio Latest Ref Range: 10 - _0 eGFR Latest Ref Range: >59 mL/min/1.73 39 (L) 66  Magnesium Latest Ref Range: 1.6 - 2.3 mg/dL  1.9  NT-Pro BNP Latest Ref Range: 0 - 376 pg/mL  Ryan Glass is a 65 year old male, never smoker with hypertension, rheumatoid arthritis and obstructive sleep apnea on CPAP who returns to pulmonary clinic for shortness of breath.    He continues to have ongoing exertional shortness of breath which is likely due to his heart failure and deconditioning at this time.  We will continue ICS/LABA inhaler therapy for reactive airways disease as he does report intermittent wheezing and benefit from these inhalers.   He is working on rescheduling his CPAP titration study and he is to call our office after this test is performed and we will set up a follow-up visit to review these results.   He will benefit from cardiopulmonary rehab and is currently on the waiting list.   He has a stationary bike and elliptical machine at home I encouraged him to start working on increasing his physical activity slowly.   Ryan Jackson,  MD [facility] Office: [phone]  Patient reported went back to work 09/29/21  Stated drank his normal amount of water in his yeti cup and didn't have increased caffeine intake.  Day at work was the most active he had been since out of work/hospitalizations. Woke up this am with headache 158/114 at 0300  Took hydralazine and was able to fall back asleep.  Patient woke up and headache 90% improved BP 154/102 before am meds.  He hasn't checked his blood pressure again since this am.  Patient reported feeling well now and no concerns.  Patient asked if his BMET can be drawn at [facility] and discussed with patient he will just need to scheduled with RN Ryan Glass next week. Discussed RN Ryan Glass in clinic Monday and I return on Tuesday am. Patient A&Ox3 spoke full sentences without difficulty.  No cough/wheezing/shortness of breath or throat clearing noted during 5 minute telephone call.  Patient verbalized understanding information/instructions, agreed with plan of care and had no further questions at this time.

## 2021-10-03 ENCOUNTER — Telehealth: Payer: Self-pay | Admitting: *Deleted

## 2021-10-03 ENCOUNTER — Ambulatory Visit: Payer: Self-pay | Admitting: *Deleted

## 2021-10-03 VITALS — BP 119/76 | HR 80

## 2021-10-03 DIAGNOSIS — J4541 Moderate persistent asthma with (acute) exacerbation: Secondary | ICD-10-CM

## 2021-10-03 NOTE — Telephone Encounter (Signed)
Scheduling pt for labs ahead of 10/11/21 appt with Dr. Mayford Knife. Can you please confirm if BMET is only lab that is needed or if others required in order to avoid multiple lab draws?  Thank you!

## 2021-10-03 NOTE — Telephone Encounter (Signed)
Great. Thank you so much for the quick response.

## 2021-10-03 NOTE — Telephone Encounter (Signed)
Patient presented to clinic today for lab draw verified BMET with cardiology office nonfasting.  Courier had already come for the day patient rescheduled to 10/27 for nonfasting BMET with RN Rolly Salter as clinic closed tomorrow.  Patient also requesting help with his inhaler Rx as pharmacy stated $100 copay.  Patient unsure which inhaler he tried to pick up as he did not have his phone with him.  Will bring name on Thursday to clinic.  Patient wanted to verify which meds are formulary and if any similar to what he was prescribed.  Discussed albuterol and Breo on his Epic chart.  Patient verbalized understanding information/instructions, agreed with plan of care and had no further questions at this time.

## 2021-10-04 ENCOUNTER — Other Ambulatory Visit: Payer: No Typology Code available for payment source

## 2021-10-05 ENCOUNTER — Other Ambulatory Visit: Payer: Self-pay

## 2021-10-05 ENCOUNTER — Ambulatory Visit: Payer: Self-pay | Admitting: *Deleted

## 2021-10-05 ENCOUNTER — Encounter: Payer: Self-pay | Admitting: *Deleted

## 2021-10-05 DIAGNOSIS — R0602 Shortness of breath: Secondary | ICD-10-CM

## 2021-10-05 NOTE — Telephone Encounter (Signed)
Message sent to Dr Francine Graven formulary options for patient insurance include Wixela, Advair Diskus or fluticasone salmeterol.  Manufacturer coupon unavailable for [employer] discontinued by manufacturer May 2022.  Patient previous copay with insurance was $10 pharmacy now wanting $100.  Can his inhaler be changed to one of the above formulary options?

## 2021-10-05 NOTE — Telephone Encounter (Signed)
Patient in clinic today for BMET draw requested by [facility] cardiology.  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  Sp02 RA 95% today improved from 10/03/21 90-92%.  Patient has follow up with Dr Mayford Knife scheduled next week.  Patient verified Earlie Server now requiring $100 copay and asking if manufacturer coupon or formulary alternative.  Discussed will check manufacturer website and [employer] formulary and sent Dr Francine Graven message.  BMET drawn today.  Walked with patient to desk to check name of inhaler and patient gait regular with some dyspnea with exertion that resolves when standing/resting.  Results will be available tomorrow patient nonfasting.  Patient verbalized understanding information/instructions, agreed with plan of care and had no further questions at this time.

## 2021-10-05 NOTE — Progress Notes (Signed)
BMET per cardiology orders

## 2021-10-06 ENCOUNTER — Telehealth: Payer: Self-pay | Admitting: *Deleted

## 2021-10-06 ENCOUNTER — Encounter: Payer: Self-pay | Admitting: *Deleted

## 2021-10-06 DIAGNOSIS — R0902 Hypoxemia: Secondary | ICD-10-CM

## 2021-10-06 DIAGNOSIS — K921 Melena: Secondary | ICD-10-CM

## 2021-10-06 LAB — BASIC METABOLIC PANEL
BUN/Creatinine Ratio: 19 (ref 10–24)
BUN: 25 mg/dL (ref 8–27)
CO2: 17 mmol/L — ABNORMAL LOW (ref 20–29)
Calcium: 8.9 mg/dL (ref 8.6–10.2)
Chloride: 108 mmol/L — ABNORMAL HIGH (ref 96–106)
Creatinine, Ser: 1.3 mg/dL — ABNORMAL HIGH (ref 0.76–1.27)
Glucose: 138 mg/dL — ABNORMAL HIGH (ref 70–99)
Potassium: 4.4 mmol/L (ref 3.5–5.2)
Sodium: 141 mmol/L (ref 134–144)
eGFR: 61 mL/min/{1.73_m2} (ref >59)

## 2021-10-06 MED ORDER — FLUTICASONE-SALMETEROL 100-50 MCG/ACT IN AEPB: 1.0000 | INHALATION_SPRAY | Freq: Two times a day (BID) | RESPIRATORY_TRACT | 0 refills | Status: DC

## 2021-10-06 NOTE — Telephone Encounter (Signed)
Pt in to clinic c/o cold sx. Congestion, productive cough, fatigue and last night was having chills. 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 Spoke with NP Inetta Fermo over phone and she approved for extra home covid test in clinic to be used. If negative pt may RTW with mask wear and clinic will f/u this weekend. If positive he will need to leave site.   O2 96%, p80, BP 134/96.   Negative covid test in clinic. Pt allowed to return to work area for remainder of shift with mask use and to leave if any new sx develop.

## 2021-10-06 NOTE — Telephone Encounter (Signed)
Case discussed with RN Rolly Salter via telephone.  Covid test negative.  Could be RSV, influenza, covid.  Patient to wear mask at work and repeat covid test in 48 hours at home this weekend.  Patient shift ends at 1500 today and he does not work weekends.  Sp02 improved from earlier this week.  Continue allergy medication and breo ellipta inhaler until runs out than transition to new fluticasone salmeterol 100/50 1 puff twice a day rx that was sent to pharmacy today.

## 2021-10-06 NOTE — Telephone Encounter (Signed)
Dr Francine Graven agreed patient could be transitioned to fluticasone salmeterol 100/50 po BID from [facility] due to cost.  New Rx entered for patient and he will need to follow up with PCM/pulmonology for refill next month.  Patient with RN Rolly Salter in clinic today cough/rhinitis.  Covid test being performed.  Discussed this could be allergy flare, RSV, influenza or covid.  Wear mask when at work and when around others.  New electronic rx fluticasone-salmeterol 100/50 1 puff BID #60 RF0 sent to patient pharmacy of choice [facility], [place].  Patient verbalized understanding information/instructions, agreed with plan of care and had no further questions at this time.

## 2021-10-07 ENCOUNTER — Other Ambulatory Visit: Payer: Self-pay

## 2021-10-07 ENCOUNTER — Inpatient Hospital Stay (HOSPITAL_COMMUNITY)
Admission: EM | Admit: 2021-10-07 | Discharge: 2021-10-12 | DRG: 193 | Disposition: A | Discharge status: Home / Self Care | Payer: No Typology Code available for payment source | Attending: Internal Medicine | Admitting: Internal Medicine

## 2021-10-07 ENCOUNTER — Emergency Department (HOSPITAL_COMMUNITY): Payer: No Typology Code available for payment source

## 2021-10-07 ENCOUNTER — Encounter (HOSPITAL_COMMUNITY): Payer: Self-pay

## 2021-10-07 DIAGNOSIS — K254 Chronic or unspecified gastric ulcer with hemorrhage: Secondary | ICD-10-CM | POA: Diagnosis present

## 2021-10-07 DIAGNOSIS — K2971 Gastritis, unspecified, with bleeding: Secondary | ICD-10-CM | POA: Diagnosis present

## 2021-10-07 DIAGNOSIS — Z6835 Body mass index (BMI) 35.0-35.9, adult: Secondary | ICD-10-CM | POA: Diagnosis not present

## 2021-10-07 DIAGNOSIS — I130 Hypertensive heart and chronic kidney disease with heart failure and stage 1 through stage 4 chronic kidney disease, or unspecified chronic kidney disease: Secondary | ICD-10-CM | POA: Diagnosis present

## 2021-10-07 DIAGNOSIS — M069 Rheumatoid arthritis, unspecified: Secondary | ICD-10-CM | POA: Diagnosis present

## 2021-10-07 DIAGNOSIS — Z20822 Contact with and (suspected) exposure to covid-19: Secondary | ICD-10-CM | POA: Diagnosis present

## 2021-10-07 DIAGNOSIS — I421 Obstructive hypertrophic cardiomyopathy: Secondary | ICD-10-CM | POA: Diagnosis present

## 2021-10-07 DIAGNOSIS — N179 Acute kidney failure, unspecified: Secondary | ICD-10-CM | POA: Diagnosis present

## 2021-10-07 DIAGNOSIS — J189 Pneumonia, unspecified organism: Principal | ICD-10-CM

## 2021-10-07 DIAGNOSIS — E1122 Type 2 diabetes mellitus with diabetic chronic kidney disease: Secondary | ICD-10-CM | POA: Diagnosis present

## 2021-10-07 DIAGNOSIS — E669 Obesity, unspecified: Secondary | ICD-10-CM | POA: Diagnosis present

## 2021-10-07 DIAGNOSIS — I4892 Unspecified atrial flutter: Secondary | ICD-10-CM | POA: Diagnosis present

## 2021-10-07 DIAGNOSIS — R0609 Other forms of dyspnea: Secondary | ICD-10-CM | POA: Diagnosis not present

## 2021-10-07 DIAGNOSIS — Z86711 Personal history of pulmonary embolism: Secondary | ICD-10-CM

## 2021-10-07 DIAGNOSIS — Z8774 Personal history of (corrected) congenital malformations of heart and circulatory system: Secondary | ICD-10-CM

## 2021-10-07 DIAGNOSIS — Z8616 Personal history of COVID-19: Secondary | ICD-10-CM | POA: Diagnosis not present

## 2021-10-07 DIAGNOSIS — N182 Chronic kidney disease, stage 2 (mild): Secondary | ICD-10-CM | POA: Diagnosis present

## 2021-10-07 DIAGNOSIS — Z8719 Personal history of other diseases of the digestive system: Secondary | ICD-10-CM

## 2021-10-07 DIAGNOSIS — I2510 Atherosclerotic heart disease of native coronary artery without angina pectoris: Secondary | ICD-10-CM | POA: Diagnosis present

## 2021-10-07 DIAGNOSIS — I440 Atrioventricular block, first degree: Secondary | ICD-10-CM | POA: Diagnosis present

## 2021-10-07 DIAGNOSIS — I10 Essential (primary) hypertension: Secondary | ICD-10-CM | POA: Diagnosis present

## 2021-10-07 DIAGNOSIS — Z7901 Long term (current) use of anticoagulants: Secondary | ICD-10-CM

## 2021-10-07 DIAGNOSIS — J440 Chronic obstructive pulmonary disease with acute lower respiratory infection: Secondary | ICD-10-CM | POA: Diagnosis present

## 2021-10-07 DIAGNOSIS — I5042 Chronic combined systolic (congestive) and diastolic (congestive) heart failure: Secondary | ICD-10-CM | POA: Diagnosis present

## 2021-10-07 DIAGNOSIS — I3139 Other pericardial effusion (noninflammatory): Secondary | ICD-10-CM | POA: Diagnosis present

## 2021-10-07 DIAGNOSIS — I471 Supraventricular tachycardia: Secondary | ICD-10-CM | POA: Diagnosis present

## 2021-10-07 DIAGNOSIS — G4733 Obstructive sleep apnea (adult) (pediatric): Secondary | ICD-10-CM | POA: Diagnosis not present

## 2021-10-07 DIAGNOSIS — J9601 Acute respiratory failure with hypoxia: Secondary | ICD-10-CM | POA: Diagnosis present

## 2021-10-07 DIAGNOSIS — Z7982 Long term (current) use of aspirin: Secondary | ICD-10-CM

## 2021-10-07 DIAGNOSIS — T502X5A Adverse effect of carbonic-anhydrase inhibitors, benzothiadiazides and other diuretics, initial encounter: Secondary | ICD-10-CM | POA: Diagnosis present

## 2021-10-07 DIAGNOSIS — I472 Ventricular tachycardia, unspecified: Secondary | ICD-10-CM | POA: Diagnosis present

## 2021-10-07 DIAGNOSIS — Z8673 Personal history of transient ischemic attack (TIA), and cerebral infarction without residual deficits: Secondary | ICD-10-CM

## 2021-10-07 DIAGNOSIS — R0600 Dyspnea, unspecified: Secondary | ICD-10-CM | POA: Diagnosis not present

## 2021-10-07 DIAGNOSIS — T45515A Adverse effect of anticoagulants, initial encounter: Secondary | ICD-10-CM | POA: Diagnosis present

## 2021-10-07 DIAGNOSIS — K432 Incisional hernia without obstruction or gangrene: Secondary | ICD-10-CM | POA: Diagnosis present

## 2021-10-07 DIAGNOSIS — E876 Hypokalemia: Secondary | ICD-10-CM | POA: Diagnosis not present

## 2021-10-07 DIAGNOSIS — I4510 Unspecified right bundle-branch block: Secondary | ICD-10-CM | POA: Diagnosis present

## 2021-10-07 DIAGNOSIS — Z79899 Other long term (current) drug therapy: Secondary | ICD-10-CM

## 2021-10-07 DIAGNOSIS — M1990 Unspecified osteoarthritis, unspecified site: Secondary | ICD-10-CM | POA: Diagnosis present

## 2021-10-07 DIAGNOSIS — E785 Hyperlipidemia, unspecified: Secondary | ICD-10-CM | POA: Diagnosis present

## 2021-10-07 DIAGNOSIS — K922 Gastrointestinal hemorrhage, unspecified: Secondary | ICD-10-CM | POA: Diagnosis not present

## 2021-10-07 DIAGNOSIS — I7121 Aneurysm of the ascending aorta, without rupture: Secondary | ICD-10-CM | POA: Diagnosis present

## 2021-10-07 DIAGNOSIS — Z7951 Long term (current) use of inhaled steroids: Secondary | ICD-10-CM

## 2021-10-07 DIAGNOSIS — D6832 Hemorrhagic disorder due to extrinsic circulating anticoagulants: Secondary | ICD-10-CM | POA: Diagnosis present

## 2021-10-07 DIAGNOSIS — Z6836 Body mass index (BMI) 36.0-36.9, adult: Secondary | ICD-10-CM | POA: Diagnosis not present

## 2021-10-07 DIAGNOSIS — Z888 Allergy status to other drugs, medicaments and biological substances status: Secondary | ICD-10-CM

## 2021-10-07 DIAGNOSIS — I480 Paroxysmal atrial fibrillation: Secondary | ICD-10-CM | POA: Diagnosis present

## 2021-10-07 DIAGNOSIS — I422 Other hypertrophic cardiomyopathy: Secondary | ICD-10-CM | POA: Diagnosis present

## 2021-10-07 DIAGNOSIS — K259 Gastric ulcer, unspecified as acute or chronic, without hemorrhage or perforation: Secondary | ICD-10-CM | POA: Diagnosis not present

## 2021-10-07 DIAGNOSIS — E861 Hypovolemia: Secondary | ICD-10-CM | POA: Diagnosis present

## 2021-10-07 DIAGNOSIS — Z95810 Presence of automatic (implantable) cardiac defibrillator: Secondary | ICD-10-CM

## 2021-10-07 DIAGNOSIS — D62 Acute posthemorrhagic anemia: Secondary | ICD-10-CM | POA: Diagnosis present

## 2021-10-07 DIAGNOSIS — Z886 Allergy status to analgesic agent status: Secondary | ICD-10-CM

## 2021-10-07 DIAGNOSIS — T39395A Adverse effect of other nonsteroidal anti-inflammatory drugs [NSAID], initial encounter: Secondary | ICD-10-CM | POA: Diagnosis present

## 2021-10-07 DIAGNOSIS — Z8249 Family history of ischemic heart disease and other diseases of the circulatory system: Secondary | ICD-10-CM

## 2021-10-07 DIAGNOSIS — R002 Palpitations: Secondary | ICD-10-CM | POA: Diagnosis present

## 2021-10-07 DIAGNOSIS — M1A9XX0 Chronic gout, unspecified, without tophus (tophi): Secondary | ICD-10-CM | POA: Diagnosis present

## 2021-10-07 LAB — RESP PANEL BY RT-PCR (FLU A&B, COVID) ARPGX2
Influenza A by PCR: NEGATIVE
Influenza B by PCR: NEGATIVE
SARS Coronavirus 2 by RT PCR: NEGATIVE

## 2021-10-07 LAB — TYPE AND SCREEN
ABO/RH(D): B NEG
Antibody Screen: NEGATIVE

## 2021-10-07 LAB — BLOOD GAS, VENOUS
Acid-base deficit: 4.3 mmol/L — ABNORMAL HIGH (ref 0.0–2.0)
Bicarbonate: 19.5 mmol/L — ABNORMAL LOW (ref 20.0–28.0)
Drawn by: 1679
FIO2: 40
O2 Saturation: 82.3 %
Patient temperature: 37.2
pCO2, Ven: 31.8 mmHg — ABNORMAL LOW (ref 44.0–60.0)
pH, Ven: 7.406 (ref 7.250–7.430)
pO2, Ven: 50.5 mmHg — ABNORMAL HIGH (ref 32.0–45.0)

## 2021-10-07 LAB — I-STAT CHEM 8, ED
BUN: 43 mg/dL — ABNORMAL HIGH (ref 8–23)
Calcium, Ion: 1.07 mmol/L — ABNORMAL LOW (ref 1.15–1.40)
Chloride: 109 mmol/L (ref 98–111)
Creatinine, Ser: 1.1 mg/dL (ref 0.61–1.24)
Glucose, Bld: 145 mg/dL — ABNORMAL HIGH (ref 70–99)
HCT: 34 % — ABNORMAL LOW (ref 39.0–52.0)
Hemoglobin: 11.6 g/dL — ABNORMAL LOW (ref 13.0–17.0)
Potassium: 3.4 mmol/L — ABNORMAL LOW (ref 3.5–5.1)
Sodium: 141 mmol/L (ref 135–145)
TCO2: 19 mmol/L — ABNORMAL LOW (ref 22–32)

## 2021-10-07 LAB — PROTIME-INR
INR: 1.2 (ref 0.8–1.2)
Prothrombin Time: 15 seconds (ref 11.4–15.2)

## 2021-10-07 LAB — I-STAT VENOUS BLOOD GAS, ED
Acid-base deficit: 4 mmol/L — ABNORMAL HIGH (ref 0.0–2.0)
Bicarbonate: 19.1 mmol/L — ABNORMAL LOW (ref 20.0–28.0)
Calcium, Ion: 1.09 mmol/L — ABNORMAL LOW (ref 1.15–1.40)
HCT: 33 % — ABNORMAL LOW (ref 39.0–52.0)
Hemoglobin: 11.2 g/dL — ABNORMAL LOW (ref 13.0–17.0)
O2 Saturation: 90 %
Potassium: 3.4 mmol/L — ABNORMAL LOW (ref 3.5–5.1)
Sodium: 140 mmol/L (ref 135–145)
TCO2: 20 mmol/L — ABNORMAL LOW (ref 22–32)
pCO2, Ven: 26.6 mmHg — ABNORMAL LOW (ref 44.0–60.0)
pH, Ven: 7.465 — ABNORMAL HIGH (ref 7.250–7.430)
pO2, Ven: 54 mmHg — ABNORMAL HIGH (ref 32.0–45.0)

## 2021-10-07 LAB — CBC WITH DIFFERENTIAL/PLATELET
Abs Immature Granulocytes: 0.03 10*3/uL (ref 0.00–0.07)
Basophils Absolute: 0 10*3/uL (ref 0.0–0.1)
Basophils Relative: 0 %
Eosinophils Absolute: 0 10*3/uL (ref 0.0–0.5)
Eosinophils Relative: 0 %
HCT: 36 % — ABNORMAL LOW (ref 39.0–52.0)
Hemoglobin: 11.7 g/dL — ABNORMAL LOW (ref 13.0–17.0)
Immature Granulocytes: 1 %
Lymphocytes Relative: 17 %
Lymphs Abs: 1.1 10*3/uL (ref 0.7–4.0)
MCH: 34.5 pg — ABNORMAL HIGH (ref 26.0–34.0)
MCHC: 32.5 g/dL (ref 30.0–36.0)
MCV: 106.2 fL — ABNORMAL HIGH (ref 80.0–100.0)
Monocytes Absolute: 0.3 10*3/uL (ref 0.1–1.0)
Monocytes Relative: 5 %
Neutro Abs: 4.9 10*3/uL (ref 1.7–7.7)
Neutrophils Relative %: 77 %
Platelets: 185 10*3/uL (ref 150–400)
RBC: 3.39 MIL/uL — ABNORMAL LOW (ref 4.22–5.81)
RDW: 14.3 % (ref 11.5–15.5)
WBC: 6.3 10*3/uL (ref 4.0–10.5)
nRBC: 0 % (ref 0.0–0.2)

## 2021-10-07 LAB — URINALYSIS, ROUTINE W REFLEX MICROSCOPIC
Bilirubin Urine: NEGATIVE
Glucose, UA: NEGATIVE mg/dL
Hgb urine dipstick: NEGATIVE
Ketones, ur: NEGATIVE mg/dL
Leukocytes,Ua: NEGATIVE
Nitrite: NEGATIVE
Protein, ur: NEGATIVE mg/dL
Specific Gravity, Urine: 1.011 (ref 1.005–1.030)
pH: 5 (ref 5.0–8.0)

## 2021-10-07 LAB — ABO/RH: ABO/RH(D): B NEG

## 2021-10-07 LAB — PROCALCITONIN: Procalcitonin: 0.93 ng/mL

## 2021-10-07 LAB — COMPREHENSIVE METABOLIC PANEL
ALT: 26 U/L (ref 0–44)
AST: 28 U/L (ref 15–41)
Albumin: 3.6 g/dL (ref 3.5–5.0)
Alkaline Phosphatase: 51 U/L (ref 38–126)
Anion gap: 11 (ref 5–15)
BUN: 48 mg/dL — ABNORMAL HIGH (ref 8–23)
CO2: 19 mmol/L — ABNORMAL LOW (ref 22–32)
Calcium: 9 mg/dL (ref 8.9–10.3)
Chloride: 108 mmol/L (ref 98–111)
Creatinine, Ser: 1.13 mg/dL (ref 0.61–1.24)
GFR, Estimated: >60 mL/min (ref >60)
Glucose, Bld: 152 mg/dL — ABNORMAL HIGH (ref 70–99)
Potassium: 3.9 mmol/L (ref 3.5–5.1)
Sodium: 138 mmol/L (ref 135–145)
Total Bilirubin: 1.1 mg/dL (ref 0.3–1.2)
Total Protein: 5.9 g/dL — ABNORMAL LOW (ref 6.5–8.1)

## 2021-10-07 LAB — TROPONIN I (HIGH SENSITIVITY)
Troponin I (High Sensitivity): 12 ng/L (ref <18)
Troponin I (High Sensitivity): 12 ng/L (ref <18)

## 2021-10-07 LAB — HEPARIN LEVEL (UNFRACTIONATED): Heparin Unfractionated: >1.1 IU/mL — ABNORMAL HIGH (ref 0.30–0.70)

## 2021-10-07 LAB — HEMOGLOBIN AND HEMATOCRIT, BLOOD
HCT: 32 % — ABNORMAL LOW (ref 39.0–52.0)
Hemoglobin: 10.5 g/dL — ABNORMAL LOW (ref 13.0–17.0)

## 2021-10-07 LAB — LACTIC ACID, PLASMA
Lactic Acid, Venous: 2 mmol/L (ref 0.5–1.9)
Lactic Acid, Venous: 2 mmol/L (ref 0.5–1.9)

## 2021-10-07 LAB — APTT: aPTT: 35 seconds (ref 24–36)

## 2021-10-07 LAB — POC OCCULT BLOOD, ED: Fecal Occult Bld: POSITIVE — AB

## 2021-10-07 LAB — BRAIN NATRIURETIC PEPTIDE: B Natriuretic Peptide: 33.9 pg/mL (ref 0.0–100.0)

## 2021-10-07 IMAGING — DX DG CHEST 1V PORT
1 series · 1 of 1 positions shown · non-contrast
Comparison: [DATE]

CLINICAL DATA: Shortness of breath

EXAM:
PORTABLE CHEST 1 VIEW

[chest ap]
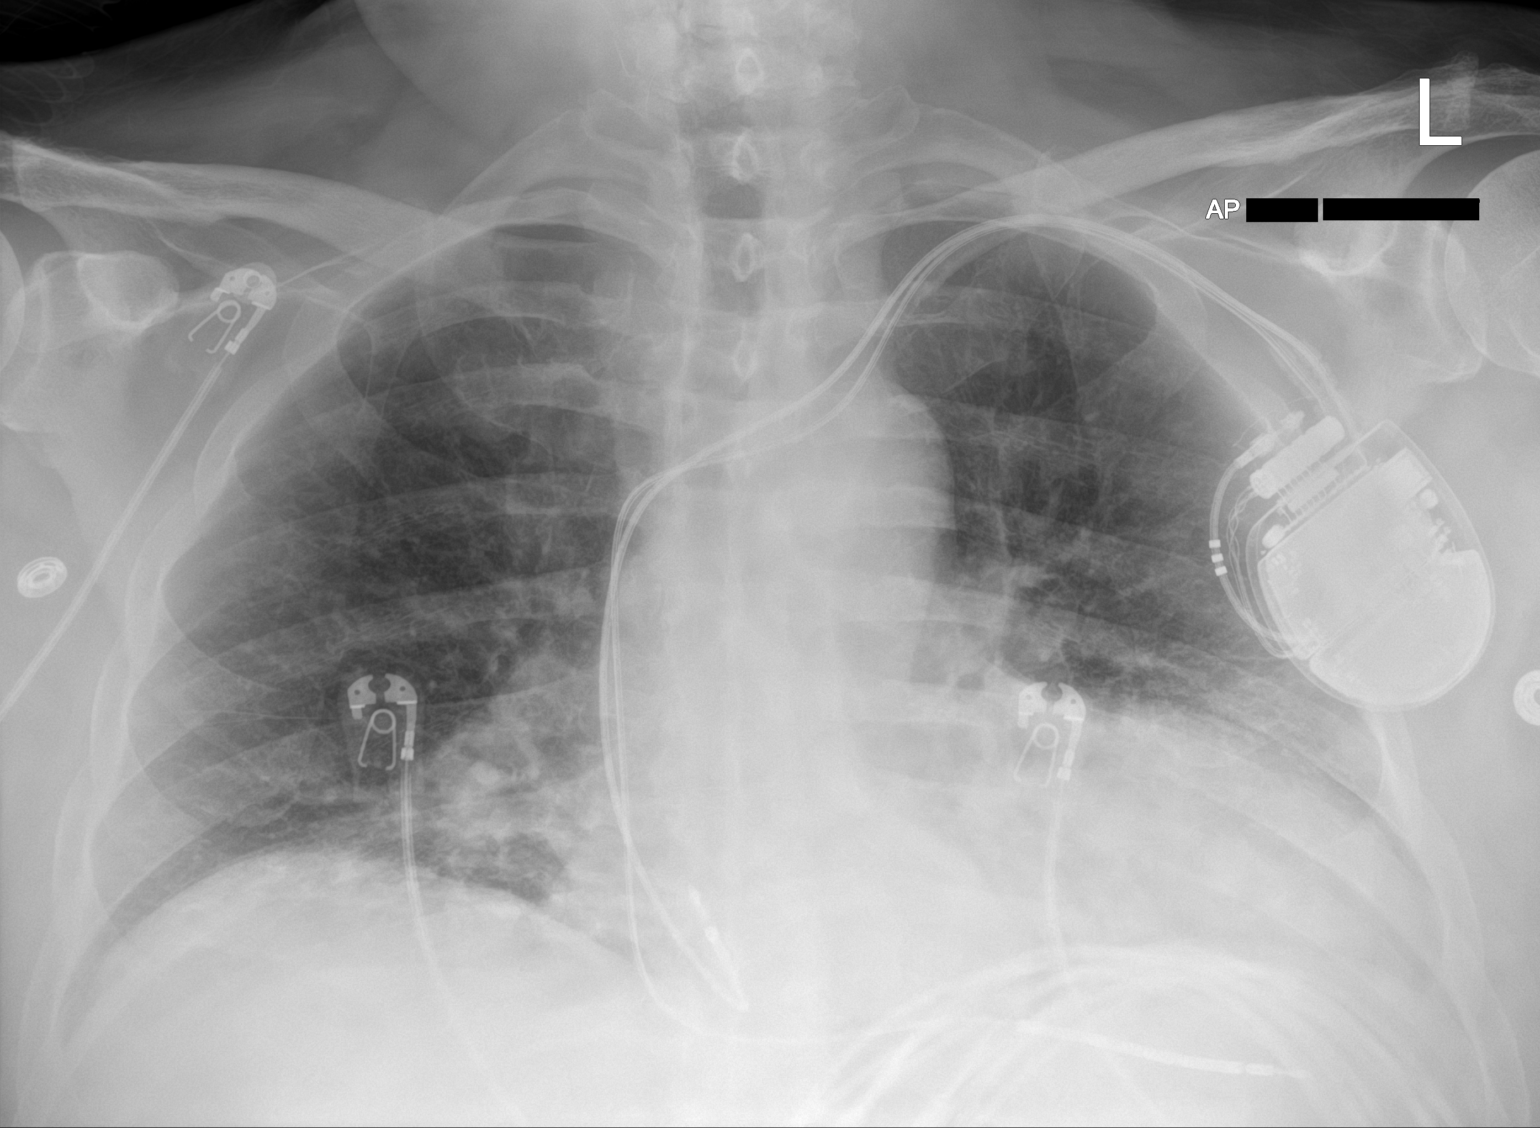

[1 of 1 positions shown; findings below may reference images not displayed]

FINDINGS: Chronic cardiopericardial enlargement. Aortic tortuosity. Low volume
chest with indistinct density at the bases. Dual-chamber pacer leads
from the left in unremarkable position. Artifact from EKG leads.
IMPRESSION: Low volume chest with atelectasis or infiltrate at the bases.

Chronic cardiomegaly.  No edema.

## 2021-10-07 MED ORDER — LOSARTAN POTASSIUM 50 MG PO TABS: 100.0000 mg | ORAL_TABLET | Freq: Every day | ORAL | Status: DC

## 2021-10-07 MED ORDER — AMIODARONE HCL IN DEXTROSE 360-4.14 MG/200ML-% IV SOLN
60.0000 mg/h | INTRAVENOUS | Status: DC
  Administered 2021-10-07: 60 mg/h via INTRAVENOUS

## 2021-10-07 MED ORDER — SOTALOL HCL 120 MG PO TABS
120.0000 mg | ORAL_TABLET | Freq: Two times a day (BID) | ORAL | Status: DC
  Administered 2021-10-07 – 2021-10-12 (×8): 120 mg via ORAL
  Filled 2021-10-07 (×11): qty 1

## 2021-10-07 MED ORDER — METOPROLOL TARTRATE 25 MG PO TABS
25.0000 mg | ORAL_TABLET | Freq: Two times a day (BID) | ORAL | Status: DC
  Administered 2021-10-07 – 2021-10-09 (×3): 25 mg via ORAL
  Filled 2021-10-07 (×4): qty 1

## 2021-10-07 MED ORDER — SODIUM CHLORIDE 0.9 % IV SOLN: 500.0000 mg | Freq: Once | INTRAVENOUS | Status: DC

## 2021-10-07 MED ORDER — AMIODARONE LOAD VIA INFUSION
150.0000 mg | Freq: Once | INTRAVENOUS | Status: AC
  Administered 2021-10-07: 150 mg via INTRAVENOUS
  Filled 2021-10-07: qty 83.34

## 2021-10-07 MED ORDER — GABAPENTIN 300 MG PO CAPS
600.0000 mg | ORAL_CAPSULE | Freq: Three times a day (TID) | ORAL | Status: DC
  Administered 2021-10-07 – 2021-10-12 (×14): 600 mg via ORAL
  Filled 2021-10-07 (×14): qty 2

## 2021-10-07 MED ORDER — SPIRONOLACTONE 25 MG PO TABS: 25.0000 mg | ORAL_TABLET | Freq: Every day | ORAL | Status: DC

## 2021-10-07 MED ORDER — MAGNESIUM SULFATE IN D5W 1-5 GM/100ML-% IV SOLN
1.0000 g | Freq: Once | INTRAVENOUS | Status: AC
  Administered 2021-10-07: 1 g via INTRAVENOUS
  Filled 2021-10-07: qty 100

## 2021-10-07 MED ORDER — GUAIFENESIN ER 600 MG PO TB12
600.0000 mg | ORAL_TABLET | Freq: Two times a day (BID) | ORAL | Status: DC
  Administered 2021-10-07 – 2021-10-12 (×10): 600 mg via ORAL
  Filled 2021-10-07 (×10): qty 1

## 2021-10-07 MED ORDER — AMIODARONE HCL IN DEXTROSE 360-4.14 MG/200ML-% IV SOLN
INTRAVENOUS | Status: AC
  Filled 2021-10-07: qty 200

## 2021-10-07 MED ORDER — FUROSEMIDE 10 MG/ML IJ SOLN
40.0000 mg | Freq: Once | INTRAMUSCULAR | Status: AC
  Administered 2021-10-07: 40 mg via INTRAVENOUS
  Filled 2021-10-07: qty 4

## 2021-10-07 MED ORDER — PREDNISONE 5 MG PO TABS
5.0000 mg | ORAL_TABLET | Freq: Every day | ORAL | Status: DC
  Administered 2021-10-08 – 2021-10-12 (×5): 5 mg via ORAL
  Filled 2021-10-07 (×6): qty 1

## 2021-10-07 MED ORDER — HYDRALAZINE HCL 50 MG PO TABS: 100.0000 mg | ORAL_TABLET | Freq: Three times a day (TID) | ORAL | Status: DC

## 2021-10-07 MED ORDER — SODIUM CHLORIDE 0.9 % IV SOLN
1.0000 g | INTRAVENOUS | Status: DC
  Administered 2021-10-08 – 2021-10-09 (×2): 1 g via INTRAVENOUS
  Filled 2021-10-07 (×2): qty 10

## 2021-10-07 MED ORDER — ATORVASTATIN CALCIUM 40 MG PO TABS
40.0000 mg | ORAL_TABLET | Freq: Every day | ORAL | Status: DC
  Administered 2021-10-07 – 2021-10-11 (×5): 40 mg via ORAL
  Filled 2021-10-07 (×5): qty 1

## 2021-10-07 MED ORDER — AMIODARONE HCL IN DEXTROSE 360-4.14 MG/200ML-% IV SOLN: 30.0000 mg/h | INTRAVENOUS | Status: DC

## 2021-10-07 MED ORDER — CHLORTHALIDONE 25 MG PO TABS: 25.0000 mg | ORAL_TABLET | Freq: Every day | ORAL | Status: DC

## 2021-10-07 MED ORDER — LEVALBUTEROL HCL 0.63 MG/3ML IN NEBU: 0.6300 mg | INHALATION_SOLUTION | Freq: Four times a day (QID) | RESPIRATORY_TRACT | Status: DC | PRN

## 2021-10-07 MED ORDER — SODIUM CHLORIDE 0.9 % IV SOLN
1.0000 g | Freq: Once | INTRAVENOUS | Status: AC
  Administered 2021-10-07: 1 g via INTRAVENOUS
  Filled 2021-10-07: qty 10

## 2021-10-07 MED ORDER — ACETAMINOPHEN-CODEINE #3 300-30 MG PO TABS: 1.0000 | ORAL_TABLET | Freq: Two times a day (BID) | ORAL | Status: DC | PRN

## 2021-10-07 MED ORDER — POTASSIUM CHLORIDE CRYS ER 20 MEQ PO TBCR: 40.0000 meq | EXTENDED_RELEASE_TABLET | ORAL | Status: AC

## 2021-10-07 MED ORDER — LOSARTAN POTASSIUM 50 MG PO TABS
100.0000 mg | ORAL_TABLET | Freq: Every day | ORAL | Status: DC
  Filled 2021-10-07: qty 2

## 2021-10-07 MED ORDER — ACETAMINOPHEN 325 MG PO TABS
650.0000 mg | ORAL_TABLET | Freq: Four times a day (QID) | ORAL | Status: DC | PRN
  Administered 2021-10-10 (×2): 650 mg via ORAL
  Filled 2021-10-07 (×2): qty 2

## 2021-10-07 MED ORDER — PANTOPRAZOLE 80MG IVPB - SIMPLE MED
80.0000 mg | Freq: Once | INTRAVENOUS | Status: AC
  Administered 2021-10-07: 80 mg via INTRAVENOUS
  Filled 2021-10-07: qty 80

## 2021-10-07 MED ORDER — TRIMETHOBENZAMIDE HCL 100 MG/ML IM SOLN
200.0000 mg | Freq: Three times a day (TID) | INTRAMUSCULAR | Status: DC | PRN
  Filled 2021-10-07: qty 2

## 2021-10-07 MED ORDER — HEPARIN (PORCINE) 25000 UT/250ML-% IV SOLN
1400.0000 [IU]/h | INTRAVENOUS | Status: DC
  Administered 2021-10-07: 1400 [IU]/h via INTRAVENOUS
  Filled 2021-10-07: qty 250

## 2021-10-07 MED ORDER — ACETAMINOPHEN 650 MG RE SUPP: 650.0000 mg | Freq: Four times a day (QID) | RECTAL | Status: DC | PRN

## 2021-10-07 MED ORDER — SODIUM CHLORIDE 0.9% FLUSH
3.0000 mL | Freq: Two times a day (BID) | INTRAVENOUS | Status: DC
  Administered 2021-10-08 – 2021-10-11 (×7): 3 mL via INTRAVENOUS

## 2021-10-07 NOTE — ED Notes (Signed)
Pt had 1 small episode of emesis, Bipap mask removed just prior to, admitting provider made aware, orders for antiemetics. Pt placed on 15 L NRB at this time

## 2021-10-07 NOTE — Consult Note (Addendum)
Cardiology Consultation:   Patient ID: Ryan Glass MRN: [id number]; DOB: [birth year]  Admit date: 10/07/2021 Date of Consult: 10/07/2021  PCP:  [name], MD   [facility] Cardiologist:  [name], MD  Electrophysiologist:  [name], MD       Patient Profile:   Ryan Glass is a 65 y.o. 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 Rodena Medin.  History of Present Illness:   Ryan Glass presented to the ED with acute hypoxic respiratory failure.  He has a complicated past medical history, recently with plans to undergo PFO closure 09/06/2021 as this was felt to be contributing to his shortness of breath however this was canceled due to increased PVC burden.  He was last seen by cardiology at an outpatient visit with Ryan Saber, PA-C 09/19/2021 at which time he had ongoing complaints of SOB with moderate exertion.  No medication changes occurred at this visit and he was recommended to follow-up with Dr. Ladona Glass as scheduled in December.  Prior to this he was hospitalized 07/2021 for VT/A. fib and again in 08/2021 for VT storm.  His last echocardiogram 07/2021 which showed EF 60-65% with mild LVH, mildly reduced RV systolic function, mild RV and positive PFO.  He had a cardiac MR which showed findings consistent with hokum with LVEF 52% and RVEF 45%. He had an ICD placed 07/2021 for secondary prevention and was started on sotalol.  He subsequently underwent TEE which showed EF 55 to 60%, mild LVH, moderately reduced RV SF, mild biatrial enlargement, mild MR, mild AI, mild ascending aorta dilation (44 mm) and moderate aortic root dilation (45 mm) evidence of interatrial shunting.    At the time of this evaluation he  reports ongoing shortness of breath.  He reports shortness of breath has been his primary issue since May 2022.  He has been following with pulmonology who felt some of his shortness of breath and hypoxia were related to PFO as above.  Heart rate has also been elevated up to 140s with paroxysmal atrial fibrillation which he has a known history of.  He reported hypoxia with O2 77% at home overnight as he was not able to tolerate his CPAP.  He reports waking up in the night to have a bowel movement, and noted melena.  Subsequent bowel movement with bright red blood.  No prior history of GI bleed.  He is on Eliquis for history of A. fib and PE 05/2021.  He has not had any issues with chest pain, palpitations, lightheadedness, syncope, lower extremity edema.  Past Medical History:  Diagnosis Date   Ascending aortic aneurysm    a. CT 05/2021 showed 4.4x4.4cm TAA.   Bilateral lower extremity edema    Chronic gout without tophus    followed by dr Ryan Glass    (06-08-2020  per pt last episode right knee 3 wks ago)   CKD (chronic kidney disease), stage II    nephrology--- Ryan Norfolk PA (10-29-2019 note in epic scanned in  media)   Heart failure with mid-range ejection fraction [facility]) 05/27/2021   05/26/21: Left ventricular ejection fraction, by estimation, is 45 to 50%. There is severe asymmetric left ventricular hypertrophy. G1DD present.    History of COVID-19 06/07/2021  HOCM (hypertrophic obstructive cardiomyopathy) (HCC)    s/p ICD 07/2021   Hypertension    followed by cardiology, dr t. turner   (05-14-2018 nuclear study in epic , normal perfusion with nuclear ef 61%)   Left hydrocele    Low serum potassium level 04/27/2014   OSA on CPAP    per pt uses every night   Palpitations followed by dr t. Mayford Knife   06-08-2020  still feels palipations due to PVCs when exertion but not with chest pain/ discomfort   Pneumonia due to COVID-19 virus    PVC's (premature ventricular contractions)  cardiologist--- dr t. turner   Status post PVC ablation by Dr. Ladona Glass 2019 with recurrence of frequent PVCs/bigeminy;  prior pseudobradycardia r/t pvcs   Rheumatoid arthritis involving multiple sites [facility])    rheumotology--- dr a. Ryan Glass  ([facility] in [place])    Past Surgical History:  Procedure Laterality Date   BUBBLE STUDY  08/18/2021   Procedure: BUBBLE STUDY;  Surgeon: Wendall Stade, MD;  Location: [facility] ENDOSCOPY;  Service: Cardiovascular;;   HYDROCELE EXCISION Left 06/14/2020   Procedure: LEFT  HYDROCELECTOMY ADULT;  Surgeon: Noel Christmas, MD;  Location: [facility];  Service: Urology;  Laterality: Left;   ICD IMPLANT N/A 08/07/2021   Procedure: ICD IMPLANT;  Surgeon: Marinus Maw, MD;  Location: [facility] INVASIVE CV LAB;  Service: Cardiovascular;  Laterality: N/A;   INCISIONAL HERNIA REPAIR  02-23-2016   @[facility]    LAPAROSCOPIC   LAPAROSCOPIC INGUINAL HERNIA REPAIR Bilateral 08-22-2015  @[facility]    AND UMBILICAL HERNIA REPAIR   PVC ABLATION N/A 10/07/2018   Procedure: PVC ABLATION;  Surgeon: , MD;  Location: [facility] INVASIVE CV LAB;  Service: Cardiovascular;  Laterality: N/A;   RIGHT/LEFT HEART CATH AND CORONARY ANGIOGRAPHY N/A 05/29/2021   Procedure: RIGHT/LEFT HEART CATH AND CORONARY ANGIOGRAPHY;  Surgeon: Marinus Maw, MD;  Location: [facility] INVASIVE CV LAB;  Service: Cardiovascular;  Laterality: N/A;   TEE WITHOUT CARDIOVERSION N/A 08/18/2021   Procedure: TRANSESOPHAGEAL ECHOCARDIOGRAM (TEE);  Surgeon: Kathleene Hazel, MD;  Location: [facility] ENDOSCOPY;  Service: Cardiovascular;  Laterality: N/A;   UMBILICAL HERNIA REPAIR  child     Home Medications:  Prior to Admission medications   Medication Sig Start Date End Date Taking? Authorizing Provider  Acetaminophen-Codeine 300-30 MG tablet Take 1 tablet by mouth every 12 (twelve) hours as needed. 09/25/21   [provider]  albuterol (VENTOLIN HFA) 108 (90 Base) MCG/ACT inhaler Inhale 2 puffs into the lungs every  4 (four) hours as needed for wheezing or shortness of breath. Patient taking differently: Inhale 2 puffs into the lungs See admin instructions. Inhale 2 puffs into the lungs at bedtime and an additional 2 puffs every 6 hours as needed for shortness of breath or wheezing 03/02/21   Betancourt, 09/27/21, NP  apixaban (ELIQUIS) 5 MG TABS tablet Take 1 tablet (5 mg total) by mouth 2 (two) times daily. 06/17/21   Parrett, Jarold Song, NP  aspirin EC 81 MG tablet Take 1 tablet (81 mg total) by mouth daily. Swallow whole. 08/24/21   Virgel Bouquet, MD  atorvastatin (LIPITOR) 40 MG tablet Take 1 tablet (40 mg total) by mouth daily. Patient taking differently: Take 40 mg by mouth at bedtime. 01/09/21   Tonny Bollman, MD  chlorthalidone (HYGROTON) 25 MG tablet Take 25 mg by mouth at bedtime. 04/02/21   [provider]  colchicine 0.6 MG tablet Take 1-2 tablets by mouth daily as needed (for  gout flares). 01/31/16   [provider]  diclofenac (VOLTAREN) 75 MG EC tablet Take 75 mg by mouth in the morning.    [provider]  fluticasone (FLONASE) 50 MCG/ACT nasal spray Place 1 spray into both nostrils daily. Patient taking differently: Place 2 sprays into both nostrils at bedtime. 06/08/21   Lilland, Alana, DO  fluticasone-salmeterol (ADVAIR DISKUS) 100-50 MCG/ACT AEPB Inhale 1 puff into the lungs 2 (two) times daily. 10/06/21 11/05/21  Betancourt, Jarold Song, NP  folic acid (FOLVITE) 1 MG tablet Take 1 mg by mouth daily. 01/29/20   [provider]  furosemide (LASIX) 40 MG tablet Take 1 tablet (40 mg total) by mouth as needed. Take 1 tablet by mouth daily for 3 days, then decrease to only as needed for increase weight gain Patient taking differently: Take 40 mg by mouth See admin instructions. Take 40 mg by mouth daily for 3 days, then decrease to only as needed for increase weight gain 07/11/21   Dyann Kief, PA-C  gabapentin (NEURONTIN) 600 MG tablet Take 600 mg by mouth with breakfast,  with lunch, and with evening meal. 02/04/19   [provider]  HUMIRA PEN 40 MG/0.4ML PNKT Inject 40 mg into the skin every 14 (fourteen) days. 04/09/21   [provider]  hydrALAZINE (APRESOLINE) 100 MG tablet Take 1 tablet (100 mg total) by mouth every 8 (eight) hours. 09/12/21   Graciella Freer, PA-C  losartan (COZAAR) 100 MG tablet Take 1 tablet (100 mg total) by mouth daily. 06/14/21   Quintella Reichert, MD  Magnesium Oxide 400 MG CAPS Take 1 capsule (400 mg total) by mouth daily. 09/21/21   Graciella Freer, PA-C  methotrexate 2.5 MG tablet Take 15 mg by mouth every Sunday. 06/22/20   [provider]  montelukast (SINGULAIR) 10 MG tablet Take 1 tablet (10 mg total) by mouth at bedtime. 09/07/21   Betancourt, Jarold Song, NP  potassium chloride SA (KLOR-CON) 20 MEQ tablet Take 2 tablets (40 mEq total) by mouth 2 (two) times daily. 09/12/21   Graciella Freer, PA-C  predniSONE (DELTASONE) 5 MG tablet Take 5 mg by mouth daily with breakfast. 01/29/20   [provider]  sildenafil (REVATIO) 20 MG tablet Take 20 mg by mouth daily as needed (erectile dysfunction). 08/03/20   [provider]  sotalol (BETAPACE) 120 MG tablet Take 1 tablet (120 mg total) by mouth every 12 (twelve) hours. 09/12/21   Graciella Freer, PA-C  spironolactone (ALDACTONE) 25 MG tablet Take 1 tablet (25 mg total) by mouth daily. 09/13/21   Graciella Freer, PA-C    Inpatient Medications: Scheduled Meds:  metoprolol tartrate  25 mg Oral BID   potassium chloride  40 mEq Oral STAT   sodium chloride flush  3 mL Intravenous Q12H   Continuous Infusions:  cefTRIAXone (ROCEPHIN)  IV 1 g (10/07/21 1539)   pantoprazole (PROTONIX) IV 80 mg (10/07/21 1546)   PRN Meds:   Allergies:    Allergies  Allergen Reactions   Amlodipine Besylate Swelling and Other (See Comments)    Leg swelling with  daily am dosing; decreased with BID  dosing   Aspirin Itching     Social History:   Social History   Socioeconomic History   Marital status: Married    Spouse name: Not on file   Number of children: Not on file   Years of education: Not on file   Highest education level: Not on file  Occupational  History   Not on file  Tobacco Use   Smoking status: Never   Smokeless tobacco: Never  Vaping Use   Vaping Use: Never used  Substance and Sexual Activity   Alcohol use: Yes    Alcohol/week: 5.0 - 7.0 standard drinks    Types: 5 - 7 Cans of beer per week   Drug use: Never   Sexual activity: Not on file  Other Topics Concern   Not on file  Social History Narrative   Not on file   Social Determinants of Health   Financial Resource Strain: Not on file  Food Insecurity: Not on file  Transportation Needs: Not on file  Physical Activity: Not on file  Stress: Not on file  Social Connections: Not on file  Intimate Partner Violence: Not on file    Family History:    Family History  Problem Relation Age of Onset   Cardiomyopathy Mother    Heart attack Father      ROS:  Please see the history of present illness.  All other ROS reviewed and negative.     Physical Exam/Data:   Vitals:   10/07/21 1145 10/07/21 1300 10/07/21 1330 10/07/21 1502  BP: 115/83 116/88 (!) 134/91   Pulse: 94 79 96 (!) 134  Resp: 16 (!) 25 (!) 25 (!) 32  Temp:      TempSrc:      SpO2: 93% 95% 100% 100%  Weight:      Height:        Intake/Output Summary (Last 24 hours) at 10/07/2021 1602 Last data filed at 10/07/2021 1525 Gross per 24 hour  Intake --  Output 1750 ml  Net -1750 ml   Last 3 Weights 10/07/2021 09/28/2021 09/19/2021  Weight (lbs) 255 lb 4.7 oz 255 lb 3.2 oz 255 lb 12.8 oz  Weight (kg) 115.8 kg 115.758 kg 116.03 kg     Body mass index is 36.63 kg/m.  General:  Well nourished, well developed, sitting upright in ER gurney with BiPAP in place.  HEENT: sclera anicteric Neck: no JVD Vascular: No carotid bruits; Distal pulses 2+  bilaterally Cardiac:  normal S1, S2; IRIR; no murmurs, rubs, or gallops Lungs:  clear to auscultation bilaterally, no wheezing, rhonchi or rales  Abd: soft, obese, nontender, no hepatomegaly  Ext: no edema Musculoskeletal:  No deformities, BUE and BLE strength normal and equal Skin: warm and dry  Neuro:  CNs 2-12 intact, no focal abnormalities noted Psych:  Normal affect   EKG:  The EKG was personally reviewed and demonstrates:  initially with sinus rhythm, rate 88 bpm, chronic RBBB/LPFB, PAC, no STE/D. Repeat EKG with atrial fibrillation with RVR with rate 149 bpm Telemetry:  Telemetry was personally reviewed and demonstrates:  intermittent sinus rhythm/sinus tachycardia/Afib with RVR  Relevant CV Studies: TEE 08/2021: 1. Will have Dr Excell Seltzer review regarding closure of PFO in regard to  patients hypoxemai.   2. Left ventricular ejection fraction, by estimation, is 55 to 60%. The  left ventricle has normal function. There is mild left ventricular  hypertrophy. Left ventricular diastolic function could not be evaluated.   3. AICD wires in RV/RA. Right ventricular systolic function is moderately  reduced. The right ventricular size is moderately enlarged.   4. R/L upper pulmonary veins and LLPV normal Unable to visualize RLPV.  Left atrial size was mildly dilated. No left atrial/left atrial appendage  thrombus was detected.   5. Evidence of atrial level shunting detected by color flow Doppler.  Agitated  saline contrast bubble study was positive with shunting observed  within 3-6 cardiac cycles suggestive of interatrial shunt.   6. Right atrial size was mildly dilated.   7. A small pericardial effusion is present. The pericardial effusion is  anterior to the right ventricle.   8. The mitral valve is grossly normal. Mild mitral valve regurgitation.   9. The aortic valve is tricuspid. Aortic valve regurgitation is mild.  10. Aortic dilatation noted. There is moderate dilatation of the  aortic  root, measuring 45 mm. There is mild dilatation of the ascending aorta,  measuring 41 mm.   Conclusion(s)/Recommendation(s): Unable to perform while anesthetized in  endoscopy but consider r/o component  of platypnea orthodeoxia with bubble study in standing and supine  positions in office.   Echocardiogram 07/2021: 1. Left ventricular ejection fraction, by estimation, is 60 to 65%. The  left ventricle has normal function. The left ventricle has no regional  wall motion abnormalities. The left ventricular internal cavity size was  mildly dilated. There is mild left  ventricular hypertrophy. Left ventricular diastolic parameters are  consistent with Grade I diastolic dysfunction (impaired relaxation).   2. Right ventricular systolic function is mildly reduced. The right  ventricular size is mildly enlarged.   3. Left atrial size was mildly dilated.   4. The mitral valve is normal in structure. No evidence of mitral valve  regurgitation. No evidence of mitral stenosis.   5. The aortic valve is tricuspid. Aortic valve regurgitation is not  visualized. Mild aortic valve sclerosis is present, with no evidence of  aortic valve stenosis.   6. Aortic dilatation noted. There is moderate dilatation of the aortic  root, measuring 46 mm. There is mild dilatation of the ascending aorta,  measuring 40 mm.   7. Agitated saline contrast bubble study was positive with shunting  observed within 3-6 cardiac cycles suggestive of interatrial shunt.  R/LHC 05/2021: Prox RCA lesion is 20% stenosed. Prox Cx to Mid Cx lesion is 20% stenosed. Mid LAD lesion is 30% stenosed.   Mild non-obstructive CAD Normal right and left heart pressures   Recommendations: Medical management of mild CAD. Will resume IV heparin 6 hours post sheath pull given acute PE. Would transition to Eliquis or Xarelto tomorrow.   Laboratory Data:  High Sensitivity Troponin:   Recent Labs  Lab 09/08/21 1200 09/08/21 1400  10/07/21 1031 10/07/21 1231  TROPONINIHS 15 15 12 12      Chemistry Recent Labs  Lab 10/05/21 1130 10/07/21 1031 10/07/21 1253  NA 141 138 140  141  K 4.4 3.9 3.4*  3.4*  CL 108* 108 109  CO2 17* 19*  --   GLUCOSE 138* 152* 145*  BUN 25 48* 43*  CREATININE 1.30* 1.13 1.10  CALCIUM 8.9 9.0  --   GFRNONAA  --  >60  --   ANIONGAP  --  11  --     Recent Labs  Lab 10/07/21 1031  PROT 5.9*  ALBUMIN 3.6  AST 28  ALT 26  ALKPHOS 51  BILITOT 1.1   Lipids No results for input(s): CHOL, TRIG, HDL, LABVLDL, LDLCALC, CHOLHDL in the last 168 hours.  Hematology Recent Labs  Lab 10/07/21 1031 10/07/21 1253  WBC 6.3  --   RBC 3.39*  --   HGB 11.7* 11.2*  11.6*  HCT 36.0* 33.0*  34.0*  MCV 106.2*  --   MCH 34.5*  --   MCHC 32.5  --   RDW 14.3  --  PLT 185  --    Thyroid No results for input(s): TSH, FREET4 in the last 168 hours.  BNP Recent Labs  Lab 10/07/21 1031  BNP 33.9    DDimer No results for input(s): DDIMER in the last 168 hours.   Radiology/Studies:  DG Chest Port 1 View  Result Date: 10/07/2021 CLINICAL DATA:  Shortness of breath EXAM: PORTABLE CHEST 1 VIEW COMPARISON:  09/08/2021 FINDINGS: Chronic cardiopericardial enlargement. Aortic tortuosity. Low volume chest with indistinct density at the bases. Dual-chamber pacer leads from the left in unremarkable position. Artifact from EKG leads. IMPRESSION: Low volume chest with atelectasis or infiltrate at the bases. Chronic cardiomegaly.  No edema. Electronically Signed   By: Tiburcio Pea M.D.   On: 10/07/2021 11:20     Assessment and Plan:   Acute hypoxic respiratory failure in patient with chronic combined CHF: Patient has had complaints of DOE since 04/2021.  He has followed with pulmonology with history of PE 05/2021.  Recent theory that PFO was contributing to his shortness of breath and intermittent hypoxia.  Initially planned for closure 09/06/2021 however this was canceled due to elevated PVC burden.   Now presenting with hypoxia overnight.  He reports he was unable to tolerate his CPAP.  O2 sats dropped to 77% prompting EMS activation.  He is maintaining O2 sat of 100% in the ED on BiPAP.  Chest x-ray without acute findings.  BNP WNL.  He does not appear significantly volume overloaded on exam.  Recurrent PE less likely given compliance with Eliquis. -We will hold off IV diuresis at this time.  Continue home p.o. Lasix -Recommend anticoagulation with heparin gtt. for management of PE 05/2021 pending further GI evaluation.  Paroxysmal atrial fibrillation: Noted to have intermittent episodes of paroxysmal A. fib in the ED with rates up to 140s at times.  Overall asymptomatic to palpitations.  He is anticoagulated with Eliquis. - We will add metoprolol tartrate 25 mg twice daily - Continue sotalol - Recommend heparin gtt. pending further GI work-up.  GI bleed: Patient presented with melena and hematochezia.  No prior history of GI bleed.  He is on Eliquis for history of A. fib and PE 05/2021.  Hemoglobin down to 11.2 from 14 09/08/2021.  FOBT positive.. - Recommend transition to heparin drip pending further GI work-up. -Consider GI consult if hemoglobin continues to drown trend and/or continues to have melena/hematochezia  HCM: Noted on cardiac MR 07/2021 to further evaluate VT.  Now s/p ICD 07/2021.  History of VT: Required admission 07/2021 for VT/new onset A. fib and underwent ICD implantation that admission.  Had subsequent hospitalization 08/2021 for VT storm for which she was started on sotalol -Continue sotalol  PFO: Noted to have interatrial shunting on TEE 08/2021.  Initially planned to undergo closure with Dr. Excell Seltzer 09/06/2021 however it appears this was canceled due to increased PVC burden. -Anticipate outpatient follow-up with Dr. Excell Seltzer to determine timing of PFO closure.  HTN: BP overall stable -Continue home medications  HLD: LDL 61 05/2021 -Continue atorvastatin Risk Assessment/Risk  Scores:   New York Heart Association (NYHA) Functional Class NYHA Class III  CHA2DS2-VASc Score = 6  This indicates a 9.7% annual risk of stroke. The patient's score is based upon: CHF History: 1 HTN History: 1 Diabetes History: 0 Stroke History: 2 Vascular Disease History: 1 Age Score: 1 Gender Score: 0     For questions or updates, please contact [facility] Please consult [email or web address] for contact info under  Signed, Beatriz Stallion, PA-C  10/07/2021 4:02 PM As above, patient seen and examined.  Briefly he is a 65 year old male with past medical history of chronic combined systolic/diastolic congestive heart failure, hypertrophic obstructive cardiomyopathy, nonobstructive coronary disease, previous PVC ablation, paroxysmal atrial fibrillation, patent foramen ovale, hypertension, hyperlipidemia, COPD, obstructive sleep apnea, previous pulmonary embolus on chronic anticoagulation, rheumatoid arthritis, chronic dyspnea admitted with dyspnea/hypoxia, paroxysmal atrial fibrillation and GI bleed.  Patient had a pulmonary embolus in June 2022.  Cardiac catheterization June 2022 showed minor nonobstructive coronary disease, normal right and left heart pressures.  Transesophageal echocardiogram September 2022 showed normal LV function, mild left ventricular hypertrophy, moderate right ventricular enlargement, moderate RV dysfunction, mild left atrial enlargement, PFO, mild right atrial enlargement, small pericardial effusion, mild mitral regurgitation, mild aortic insufficiency, dilated aortic root at 45 mm.  Patient has chronic dyspnea of uncertain etiology.  High resolution CT June 2022 showed atelectasis.  Also with previous ICD implant.  Because of persistent dyspnea he was seen by Dr. Excell Seltzer and was originally scheduled for closure of PFO.  However he became bradycardic with PVCs and apparently this was canceled.  He states that he was unable to wear his CPAP last evening and his  saturations dropped into the 70s and 80s.  He noted dyspnea.  No chest pain, palpitations or syncope.  He went to the bathroom and felt dizzy.  He states he had a black bowel movement followed by hematochezia.  He was brought to the emergency room and cardiology now asked to evaluate.  In the emergency room he is going in and out of atrial fibrillation with rapid ventricular response. Potassium 3.4, BUN 43, creatinine 1.10; Hgb 11.6 Hemoccult positive Electrocardiogram shows normal sinus rhythm, first-degree AV block, PAC, right bundle branch block.  Follow-up shows atrial fibrillation with rapid ventricular response.  1 dyspnea/hypoxia-this has been a chronic issue.  Etiology unclear.  He does not appear to be volume overloaded on examination and we will not diurese at this point.  He has been on anticoagulation at home and I therefore think that repeat pulmonary embolus is unlikely.  He has had an extensive pulmonary evaluation.  There was some discussion about closing his PFO but that apparently has been delayed.  Would wean O2 as tolerated and follow saturations.  2 paroxysmal atrial fibrillation-while in the emergency room patient was in and out of atrial fibrillation with rapid ventricular response.  Would resume home dose of sotalol and not treat with amiodarone.  Add metoprolol 25 mg twice daily.  We will hold on apixaban given possible GI bleed.  Treat with heparin in the interim.  3 GI bleed-hemoglobin unchanged.  Would continue to follow.  May need GI evaluation.  We will continue IV heparin and hold apixaban for possible GI evaluation.  4 history of hypertrophic cardiomyopathy  5 history of VT-resume sotalol.  Previous ICD in place.  6 patent foramen ovale-we will need follow-up with Dr. Mayford Knife and Dr. Excell Seltzer for further management.  7 chronic combined systolic/diastolic congestive heart failure-he is euvolemic.  We will continue diuretics at preadmission dose.  8 hypertension-continue  preadmission medications.  Olga Millers, MD

## 2021-10-07 NOTE — Progress Notes (Signed)
ANTICOAGULATION CONSULT NOTE - Initial Consult  Pharmacy Consult for heparin Indication: atrial fibrillation  Allergies  Allergen Reactions   Amlodipine Besylate Swelling and Other (See Comments)    Leg swelling with 10 mg daily am dosing; decreased with BID 5 mg dosing   Aspirin Itching    Patient Measurements: Height: 5\' 10"  (177.8 cm) Weight: 115.8 kg (255 lb 4.7 oz) IBW/kg (Calculated) : 73 Heparin Dosing Weight: 98.6 kg  Vital Signs: Temp: 97.3 F (36.3 C) (10/29 1024) Temp Source: Temporal (10/29 1024) BP: 129/77 (10/29 1830) Pulse Rate: 70 (10/29 1830)  Labs: Recent Labs    10/05/21 1130 10/07/21 1031 10/07/21 1231 10/07/21 1253  HGB  --  11.7*  --  11.2*  11.6*  HCT  --  36.0*  --  33.0*  34.0*  PLT  --  185  --   --   CREATININE 1.30* 1.13  --  1.10  TROPONINIHS  --  12 12  --     Estimated Creatinine Clearance: 85.3 mL/min (by C-G formula based on SCr of 1.1 mg/dL).   Medical History: Past Medical History:  Diagnosis Date   Ascending aortic aneurysm    a. CT 05/2021 showed 4.4x4.4cm TAA.   Bilateral lower extremity edema    Chronic gout without tophus    followed by dr 06/2021    (06-08-2020  per pt last episode right knee 3 wks ago)   CKD (chronic kidney disease), stage II    nephrology--- 06-10-2020 PA (10-29-2019 note in epic scanned in  media)   Heart failure with mid-range ejection fraction [facility]) 05/27/2021   05/26/21: Left ventricular ejection fraction, by estimation, is 45 to 50%. There is severe asymmetric left ventricular hypertrophy. G1DD present.    History of COVID-19 06/07/2021   HOCM (hypertrophic obstructive cardiomyopathy) (HCC)    s/p ICD 07/2021   Hypertension    followed by cardiology, dr t. turner   (05-14-2018 nuclear study in epic , normal perfusion with nuclear ef 61%)   Left hydrocele    Low serum potassium level 04/27/2014   OSA on CPAP    per pt uses every night   Palpitations followed by dr t. 04/29/2014    06-08-2020  still feels palipations due to PVCs when exertion but not with chest pain/ discomfort   Pneumonia due to COVID-19 virus    PVC's (premature ventricular contractions) cardiologist--- dr t. turner   Status post PVC ablation by Dr. 06-10-2020 2019 with recurrence of frequent PVCs/bigeminy;  prior pseudobradycardia r/t pvcs   Rheumatoid arthritis involving multiple sites [facility])    rheumotology--- dr a. [facility]  ([facility] in [place])    Medications:  see MAR  Assessment: 65 yo M with pAF (PTA eliquis) coming in with acute hypoxic resp failure. Also with melena + hematochezia. Hgb 11 from 14 back in Sept. FOBT+. Cards rec to do heparin gtt while GI work up pending. Holding PTA eliquis, last dose yesterday at 1800.  Goal of Therapy:  Heparin level 0.3-0.7 units/ml aPTT 66-102 seconds Monitor platelets by anticoagulation protocol: Yes   Plan:  Start heparin infusion at 1400 units/hr Check anti-Xa level in 6 hours and daily while on heparin Continue to monitor H&H and platelets  09-28-1977, PharmD, [facility] Clinical Pharmacist ED RPh Phone: [phone] Main RX: [phone]

## 2021-10-07 NOTE — Significant Event (Addendum)
Night coverage note:  Pt with BP 106/72, HR 80s:  Informed by RN that he has: Lopressor, losartan, and hydralazine due.  1) stopping hydralazine for the moment 2) losartan is done daily, will hold for tonight and plan on giving at 10am if BP more elevated at that time. 3) Lopressor BID was just given at 1556, not sure why it is showing as due now, but told RN not to give until tomorrow morning.

## 2021-10-07 NOTE — ED Notes (Signed)
Pt bumped up to 6L due to saturations dropping to 91%. Maintaining at 93 to 96%

## 2021-10-07 NOTE — ED Notes (Signed)
Dr. Katrinka Blazing made aware of pts continuous runs of vtach, paging cardiology at this time. EKG captured

## 2021-10-07 NOTE — Telephone Encounter (Signed)
Patient contacted NP via telephone woke up having black stools at 0400, short of breath.  Now having bright red blood stools. Patient on eliquis  Spouse reporting patient not acting like his normal self and sp02 decreased 70s during night and has improved some upon getting up this morning.  Asked patient if still having cold symptoms or abdomen pain and he did not answer question only asked again if he should go to hospital.  Agreed patient should go to hospital.  Spouse asking if she should take patient to hospital/ER.  Agreed patient should be taken to ER for further evaluation and treatment.  Recommended ER that is attached to a hospital not a standalone ER that cannot admit patients.  Patient A&Ox3 spoke full sentences without difficulty.  No cough/congestion/throat clearing noted during 3 minute telephone call.  Discussed I will call patient again this afternoon to follow up.  Patient and spouse verbalized understanding information/instructions, agreed with plan of care and had no further questions at this time.  BMET nonfasting results 10/05/21 worsening creatinine.  Cardiology appt scheduled for mid week with Dr [name].  Patient returned to working full time 1 week ago.  Results for ZAHI, PLASKETT (MRN [id number]) as of 10/07/2021 09:06  Ref. Range 10/05/2021 11:30  Sodium Latest Ref Range: 134 - 144 mmol/L 141  Potassium Latest Ref Range: 3.5 - 5.2 mmol/L 4.4  Chloride Latest Ref Range: 96 - 106 mmol/L 108 (H)  CO2 Latest Ref Range: 20 - 29 mmol/L 17 (L)  Glucose Latest Ref Range: 70 - 99 mg/dL 138 (H)  BUN Latest Ref Range: 8 - 27 mg/dL 25  Creatinine Latest Ref Range: 0.76 - 1.27 mg/dL 1.30 (H)  Calcium Latest Ref Range: 8.6 - 10.2 mg/dL 8.9  BUN/Creatinine Ratio Latest Ref Range: 10 - 24  19  eGFR Latest Ref Range: >59 mL/min/1.73 61

## 2021-10-07 NOTE — ED Notes (Signed)
Pt called out stating he needed a bigger BiPAP mask, pt took off mask, NRB placed on pt, RT made aware and is coming to bedside

## 2021-10-07 NOTE — ED Notes (Signed)
Interrogating st Jude ICD at this time

## 2021-10-07 NOTE — H&P (Signed)
History and Physical    Ryan Glass WUX:[id number] DOB: [birth year] DOA: 10/07/2021  Referring MD/NP/PA: Kristine Royal, MD PCP: [name], MD  Patient coming from: Home with EMS  Chief Complaint: Shortness of breath  I have personally briefly reviewed patient's old medical records in [facility]   HPI: Ryan Glass is a 65 y.o. male with medical history significant of combined systolic and diastolic CHF, HOCM, VT s/p ICD, CAD, PAF, HTN, HLD, COPD, PE in 05/2021 on LE, OSA on CPAP who presents with complaints of shortness of breath.  Patient reports that he has had progressively worsening shortness of breath especially with exertion the last 1-2 weeks.  Complained of being unable to lay flat at night due to symptoms and worsened lower extremity.  Denies having any ICD shocks to his knowledge.  He has had a productive cough with greenish sputum production.  At home patient reports that his oxygen saturations had been dropping.  Normally he is not on oxygen at baseline.  This morning he did report having a bowel movement for which he did note bright red blood present.  In addition to Eliquis he is also taking Voltaren 75 mg tablets daily for history of arthritis.  He previously had a colonoscopy approximately 3 years ago with a Dr. Nedra Hai in [facility] where he reports a polyp which had to be removed.  He was scheduled to have a follow-up colonoscopy in May, but had to rescheduled due to other medical problems.  Patient reports a significant history of drinking alcohol, but has not had a drink in 3 months.  Upon EMS arrival patient was noted to have O2 saturations of 78% on room air.  Placed on CPAP with some improvement in work of breathing.  ED Course: Upon admission into the emergency department patient was noted to be afebrile, pulse 79-1 34 in atrial flutter, respirations 16-32, blood pressures 113/94-134/91, and O2 saturation were initially maintained after being switched over to  BiPAP.  Labs significant for hemoglobin 11.7-> 11.6-> 11.2, BUN 48, creatinine 1.13, troponin 12->12, and lactic acid 2->2.  Chest x-ray noted low volume chest with atelectasis or infiltrate at the bases and chronic cardiomegaly with no edema appreciated.  Stool guaiacs were noted to be positive for blood.  Urinalysis showed no signs of infection.  Influenza and COVID-19 screening were negative.  Patient had been given Lasix 40 mg IV, Protonix 80 mg x 1 dose, Rocephin 1 g IV 1 dose, and loaded with amiodarone   Review of Systems  Constitutional:  Positive for malaise/fatigue.  Eyes:  Negative for photophobia and pain.  Respiratory:  Positive for cough, sputum production and shortness of breath.   Cardiovascular:  Positive for chest pain. Negative for leg swelling.  Gastrointestinal:  Positive for blood in stool, nausea and vomiting. Negative for abdominal pain.  Psychiatric/Behavioral:  Negative for substance abuse.    Past Medical History:  Diagnosis Date   Ascending aortic aneurysm    a. CT 05/2021 showed 4.4x4.4cm TAA.   Bilateral lower extremity edema    Chronic gout without tophus    followed by dr Sharmon Revere    (06-08-2020  per pt last episode right knee 3 wks ago)   CKD (chronic kidney disease), stage II    nephrology--- Virgina Norfolk PA (10-29-2019 note in epic scanned in  media)   Heart failure with mid-range ejection fraction [facility]) 05/27/2021   05/26/21: Left ventricular ejection fraction, by estimation, is 45 to 50%. There  is severe asymmetric left ventricular hypertrophy. G1DD present.    History of COVID-19 06/07/2021   HOCM (hypertrophic obstructive cardiomyopathy) (HCC)    s/p ICD 07/2021   Hypertension    followed by cardiology, dr t. turner   (05-14-2018 nuclear study in epic , normal perfusion with nuclear ef 61%)   Left hydrocele    Low serum potassium level 04/27/2014   OSA on CPAP    per pt uses every night   Palpitations followed by dr t. Mayford Knife   06-08-2020   still feels palipations due to PVCs when exertion but not with chest pain/ discomfort   Pneumonia due to COVID-19 virus    PVC's (premature ventricular contractions) cardiologist--- dr t. turner   Status post PVC ablation by Dr. Ladona Ridgel 2019 with recurrence of frequent PVCs/bigeminy;  prior pseudobradycardia r/t pvcs   Rheumatoid arthritis involving multiple sites [facility])    rheumotology--- dr a. Sharmon Revere  ([facility] in [place])    Past Surgical History:  Procedure Laterality Date   BUBBLE STUDY  08/18/2021   Procedure: BUBBLE STUDY;  Surgeon: Wendall Stade, MD;  Location: [facility] ENDOSCOPY;  Service: Cardiovascular;;   HYDROCELE EXCISION Left 06/14/2020   Procedure: LEFT  HYDROCELECTOMY ADULT;  Surgeon: Noel Christmas, MD;  Location: [facility];  Service: Urology;  Laterality: Left;   ICD IMPLANT N/A 08/07/2021   Procedure: ICD IMPLANT;  Surgeon: Marinus Maw, MD;  Location: [facility] INVASIVE CV LAB;  Service: Cardiovascular;  Laterality: N/A;   INCISIONAL HERNIA REPAIR  02-23-2016      LAPAROSCOPIC   LAPAROSCOPIC INGUINAL HERNIA REPAIR Bilateral 08-22-2015     AND UMBILICAL HERNIA REPAIR   PVC ABLATION N/A 10/07/2018   Procedure: PVC ABLATION;  Surgeon: Marinus Maw, MD;  Location: [facility] INVASIVE CV LAB;  Service: Cardiovascular;  Laterality: N/A;   RIGHT/LEFT HEART CATH AND CORONARY ANGIOGRAPHY N/A 05/29/2021   Procedure: RIGHT/LEFT HEART CATH AND CORONARY ANGIOGRAPHY;  Surgeon: Kathleene Hazel, MD;  Location: [facility] INVASIVE CV LAB;  Service: Cardiovascular;  Laterality: N/A;   TEE WITHOUT CARDIOVERSION N/A 08/18/2021   Procedure: TRANSESOPHAGEAL ECHOCARDIOGRAM (TEE);  Surgeon: Wendall Stade, MD;  Location: [facility] ENDOSCOPY;  Service: Cardiovascular;  Laterality: N/A;   UMBILICAL HERNIA REPAIR  child     reports that he has never smoked. He has never used smokeless tobacco. He reports current alcohol use of about 5.0 - 7.0 standard drinks per week. He reports that he does not  use drugs.  Allergies  Allergen Reactions   Amlodipine Besylate Swelling and Other (See Comments)    Leg swelling with  daily am dosing; decreased with BID  dosing   Aspirin Itching    Family History  Problem Relation Age of Onset   Cardiomyopathy Mother    Heart attack Father     Prior to Admission medications   Medication Sig Start Date End Date Taking? Authorizing Provider  Acetaminophen-Codeine 300-30 MG tablet Take 1 tablet by mouth every 12 (twelve) hours as needed. 09/25/21   [provider]  albuterol (VENTOLIN HFA) 108 (90 Base) MCG/ACT inhaler Inhale 2 puffs into the lungs every 4 (four) hours as needed for wheezing or shortness of breath. Patient taking differently: Inhale 2 puffs into the lungs See admin instructions. Inhale 2 puffs into the lungs at bedtime and an additional 2 puffs every 6 hours as needed for shortness of breath or wheezing 03/02/21   Betancourt, Jarold Song, NP  apixaban (ELIQUIS) 5 MG TABS tablet Take  1 tablet (5 mg total) by mouth 2 (two) times daily. 06/17/21   Parrett, Virgel Bouquet, NP  aspirin EC 81 MG tablet Take 1 tablet (81 mg total) by mouth daily. Swallow whole. 08/24/21   Tonny Bollman, MD  atorvastatin (LIPITOR) 40 MG tablet Take 1 tablet (40 mg total) by mouth daily. Patient taking differently: Take 40 mg by mouth at bedtime. 01/09/21   Quintella Reichert, MD  chlorthalidone (HYGROTON) 25 MG tablet Take 25 mg by mouth at bedtime. 04/02/21   [provider]  colchicine 0.6 MG tablet Take 1-2 tablets by mouth daily as needed (for gout flares). 01/31/16   [provider]  diclofenac (VOLTAREN) 75 MG EC tablet Take 75 mg by mouth in the morning.    [provider]  fluticasone (FLONASE) 50 MCG/ACT nasal spray Place 1 spray into both nostrils daily. Patient taking differently: Place 2 sprays into both nostrils at bedtime. 06/08/21   Lilland, Alana, DO  fluticasone-salmeterol (ADVAIR DISKUS) 100-50 MCG/ACT AEPB Inhale 1 puff  into the lungs 2 (two) times daily. 10/06/21 11/05/21  Betancourt, Jarold Song, NP  folic acid (FOLVITE) 1 MG tablet Take 1 mg by mouth daily. 01/29/20   [provider]  furosemide (LASIX) 40 MG tablet Take 1 tablet (40 mg total) by mouth as needed. Take 1 tablet by mouth daily for 3 days, then decrease to only as needed for increase weight gain Patient taking differently: Take 40 mg by mouth See admin instructions. Take 40 mg by mouth daily for 3 days, then decrease to only as needed for increase weight gain 07/11/21   Dyann Kief, PA-C  gabapentin (NEURONTIN) 600 MG tablet Take 600 mg by mouth with breakfast, with lunch, and with evening meal. 02/04/19   [provider]  HUMIRA PEN 40 MG/0.4ML PNKT Inject 40 mg into the skin every 14 (fourteen) days. 04/09/21   [provider]  hydrALAZINE (APRESOLINE) 100 MG tablet Take 1 tablet (100 mg total) by mouth every 8 (eight) hours. 09/12/21   Graciella Freer, PA-C  losartan (COZAAR) 100 MG tablet Take 1 tablet (100 mg total) by mouth daily. 06/14/21   Quintella Reichert, MD  Magnesium Oxide 400 MG CAPS Take 1 capsule (400 mg total) by mouth daily. 09/21/21   Graciella Freer, PA-C  methotrexate 2.5 MG tablet Take 15 mg by mouth every Sunday. 06/22/20   [provider]  montelukast (SINGULAIR) 10 MG tablet Take 1 tablet (10 mg total) by mouth at bedtime. 09/07/21   Betancourt, Jarold Song, NP  potassium chloride SA (KLOR-CON) 20 MEQ tablet Take 2 tablets (40 mEq total) by mouth 2 (two) times daily. 09/12/21   Graciella Freer, PA-C  predniSONE (DELTASONE) 5 MG tablet Take 5 mg by mouth daily with breakfast. 01/29/20   [provider]  sildenafil (REVATIO) 20 MG tablet Take 20 mg by mouth daily as needed (erectile dysfunction). 08/03/20   [provider]  sotalol (BETAPACE) 120 MG tablet Take 1 tablet (120 mg total) by mouth every 12 (twelve) hours. 09/12/21   Graciella Freer, PA-C   spironolactone (ALDACTONE) 25 MG tablet Take 1 tablet (25 mg total) by mouth daily. 09/13/21   Graciella Freer, PA-C    Physical Exam:  Constitutional: Obese male who appears to be acutely ill Vitals:   10/07/21 1145 10/07/21 1300 10/07/21 1330 10/07/21 1502  BP: 115/83 116/88 (!) 134/91   Pulse: 94 79 96 (!) 134  Resp: 16 (!)  25 (!) 25 (!) 32  Temp:      TempSrc:      SpO2: 93% 95% 100% 100%  Weight:      Height:       Eyes: PERRL, lids and conjunctivae normal ENMT: Mucous membranes are moist. Posterior pharynx clear of any exudate or lesions. Neck: normal, supple, no masses, no thyromegaly Respiratory: Tachypneic with decreased overall aeration no significant wheezes appreciated.  Patient currently on a nonrebreather with O2 saturations maintained Cardiovascular: Irregularly irregular tachycardic.  Trace lower extremity edema  abdomen: no tenderness, no masses palpated. No hepatosplenomegaly. Bowel sounds positive.  Musculoskeletal: no clubbing / cyanosis. No joint deformity upper and lower extremities. Good ROM, no contractures. Normal muscle tone.  Skin: no rashes, lesions, ulcers. No induration Neurologic: CN 2-12 grossly intact. Sensation intact, DTR normal. Strength 5/5 in all 4.  Psychiatric: Normal judgment and insight. Alert and oriented x 3. Normal mood.     Labs on Admission: I have personally reviewed following labs and imaging studies  CBC: Recent Labs  Lab 10/07/21 1031 10/07/21 1253  WBC 6.3  --   NEUTROABS 4.9  --   HGB 11.7* 11.2*  11.6*  HCT 36.0* 33.0*  34.0*  MCV 106.2*  --   PLT 185  --    Basic Metabolic Panel: Recent Labs  Lab 10/05/21 1130 10/07/21 1031 10/07/21 1253  NA 141 138 140  141  K 4.4 3.9 3.4*  3.4*  CL 108* 108 109  CO2 17* 19*  --   GLUCOSE 138* 152* 145*  BUN 25 48* 43*  CREATININE 1.30* 1.13 1.10  CALCIUM 8.9 9.0  --    GFR: Estimated Creatinine Clearance: 85.3 mL/min (by C-G formula based on SCr of 1.1  mg/dL). Liver Function Tests: Recent Labs  Lab 10/07/21 1031  AST 28  ALT 26  ALKPHOS 51  BILITOT 1.1  PROT 5.9*  ALBUMIN 3.6   No results for input(s): LIPASE, AMYLASE in the last 168 hours. No results for input(s): AMMONIA in the last 168 hours. Coagulation Profile: No results for input(s): INR, PROTIME in the last 168 hours. Cardiac Enzymes: No results for input(s): CKTOTAL, CKMB, CKMBINDEX, TROPONINI in the last 168 hours. BNP (last 3 results) Recent Labs    02/03/21 1515 02/06/21 1028 09/19/21 0938  PROBNP 141 47 355   HbA1C: No results for input(s): HGBA1C in the last 72 hours. CBG: No results for input(s): GLUCAP in the last 168 hours. Lipid Profile: No results for input(s): CHOL, HDL, LDLCALC, TRIG, CHOLHDL, LDLDIRECT in the last 72 hours. Thyroid Function Tests: No results for input(s): TSH, T4TOTAL, FREET4, T3FREE, THYROIDAB in the last 72 hours. Anemia Panel: No results for input(s): VITAMINB12, FOLATE, FERRITIN, TIBC, IRON, RETICCTPCT in the last 72 hours. Urine analysis:    Component Value Date/Time   COLORURINE STRAW (A) 10/07/2021 1031   APPEARANCEUR CLEAR 10/07/2021 1031   LABSPEC 1.011 10/07/2021 1031   PHURINE 5.0 10/07/2021 1031   GLUCOSEU NEGATIVE 10/07/2021 1031   HGBUR NEGATIVE 10/07/2021 1031   BILIRUBINUR NEGATIVE 10/07/2021 1031   KETONESUR NEGATIVE 10/07/2021 1031   PROTEINUR NEGATIVE 10/07/2021 1031   NITRITE NEGATIVE 10/07/2021 1031   LEUKOCYTESUR NEGATIVE 10/07/2021 1031   Sepsis Labs: Recent Results (from the past 240 hour(s))  Resp Panel by RT-PCR (Flu A&B, Covid) Nasopharyngeal Swab     Status: None   Collection Time: 10/07/21 10:32 AM   Specimen: Nasopharyngeal Swab; Nasopharyngeal(NP) swabs in vial transport medium  Result Value Ref Range  Status   SARS Coronavirus 2 by RT PCR NEGATIVE NEGATIVE Final    Comment: (NOTE) SARS-CoV-2 target nucleic acids are NOT DETECTED.  The SARS-CoV-2 RNA is generally detectable in upper  respiratory specimens during the acute phase of infection. The lowest concentration of SARS-CoV-2 viral copies this assay can detect is 138 copies/mL. A negative result does not preclude SARS-Cov-2 infection and should not be used as the sole basis for treatment or other patient management decisions. A negative result may occur with  improper specimen collection/handling, submission of specimen other than nasopharyngeal swab, presence of viral mutation(s) within the areas targeted by this assay, and inadequate number of viral copies(<138 copies/mL). A negative result must be combined with clinical observations, patient history, and epidemiological information. The expected result is Negative.  Fact Sheet for Patients:  BloggerCourse.com  Fact Sheet for Healthcare Providers:  SeriousBroker.it  This test is no t yet approved or cleared by the [place] FDA and  has been authorized for detection and/or diagnosis of SARS-CoV-2 by FDA under an Emergency Use Authorization (EUA). This EUA will remain  in effect (meaning this test can be used) for the duration of the COVID-19 declaration under Section 564(b)(1) of the Act, 21 U.S.C.section 360bbb-3(b)(1), unless the authorization is terminated  or revoked sooner.       Influenza A by PCR NEGATIVE NEGATIVE Final   Influenza B by PCR NEGATIVE NEGATIVE Final    Comment: (NOTE) The Xpert Xpress SARS-CoV-2/FLU/RSV plus assay is intended as an aid in the diagnosis of influenza from Nasopharyngeal swab specimens and should not be used as a sole basis for treatment. Nasal washings and aspirates are unacceptable for Xpert Xpress SARS-CoV-2/FLU/RSV testing.  Fact Sheet for Patients: BloggerCourse.com  Fact Sheet for Healthcare Providers: SeriousBroker.it  This test is not yet approved or cleared by the [place] FDA and has been  authorized for detection and/or diagnosis of SARS-CoV-2 by FDA under an Emergency Use Authorization (EUA). This EUA will remain in effect (meaning this test can be used) for the duration of the COVID-19 declaration under Section 564(b)(1) of the Act, 21 U.S.C. section 360bbb-3(b)(1), unless the authorization is terminated or revoked.  Performed at [facility], [address], [place], [place] [address]      Radiological Exams on Admission: DG Chest Port 1 View  Result Date: 10/07/2021 CLINICAL DATA:  Shortness of breath EXAM: PORTABLE CHEST 1 VIEW COMPARISON:  09/08/2021 FINDINGS: Chronic cardiopericardial enlargement. Aortic tortuosity. Low volume chest with indistinct density at the bases. Dual-chamber pacer leads from the left in unremarkable position. Artifact from EKG leads. IMPRESSION: Low volume chest with atelectasis or infiltrate at the bases. Chronic cardiomegaly.  No edema. Electronically Signed   By: Tiburcio Pea M.D.   On: 10/07/2021 11:20    EKG: Independently reviewed.  Repeat EKG noted atrial flutter with a 2:1 heart block.  Assessment/Plan Acute respiratory failure with hypoxia possible pneumonia: Patient presents with complaints of progressively worsening shortness of breath productive cough with greenish sputum production.  Found to have O2 saturations as low as 77% on room air currently maintaining O2 saturations on nonrebreather.  Chest x-ray low lung volumes with atelectasis or infiltrate at the bases.  He reported compliance with Eliquis so lower suspicion for recurrent PE.  Patient's reports of shortness of breath were thought possibly related with history of PFO, but also question possibility for pneumonia. -Admit to a progressive bed -Continuous pulse oximetry with nasal cannula oxygen maintain O2 saturation -Check procalcitonin -  Continue empiric antibiotics of Rocephin   Paroxysmal atrial fibrillation on chronic anticoagulation Eliquis: Patient was  noted to be atrial fibrillation with heart rates up to the 150s. -Hold Eliquis -Heparin drip due to recent history of pulmonary embolus -Continue sotalol -Cardiology added on metoprolol 25 mg twice daily -Goal of at least potassium 4 and magnesium 2.  Will replace electrolytes as needed   Acute blood loss anemia secondary to GI bleed: Patient reported that he had been having dark stools over the last few days at home.  Hemoglobin noted to be 11.7->11.2 with microcytic anemia, but baseline previously had been 14 g/dL on 9/21.  Stool guaiacs were noted to be positive.  Patient had been given 80 mg of Protonix IV.  Patient admits that he has been on Voltaren. -Type and screen for possible need of blood products -Serial monitoring of H&H -Clear liquid diet and n.p.o. after midnight -Discontinue Voltaren tablets -Due to prolonged QT interval  -GI consulted, will follow-up for any further recommendations  History of VT: Patient was noted to have several runs of SVT while in the ED.  Just recently required admission in 07/2019 for VT/new-onset A. fib and underwent ICD implantation. -Continue medications as seen above  Chronic diastolic congestive heart failure HCOM noted on prior cardiac MR from 07/2021: On physical exam patient with trace lower extremity edema.  Last EF noted to be 55 to 60% with TEE.  BNP was noted to be 33.9.  Patient has been given 40 mg in the ED. -Strict I&Os and daily weights -Continue diuretics chlorthalidone and spironolactone  Prolonged QT interval: QT interval noted to be in the 600s.  Avoided Protonix drip at this time due to the.  Essential hypertension: Blood pressures currently maintained.  Home blood pressure regimen includes chlorthalidone 25 mg nightly, hydralazine 100 mg every 8 hours, losartan 100 mg daily, sotalol 120 mg every 12 hours, and Aldactone 25 mg daily. -Continue current home medication regimen  Hypokalemia: Acute.  Potassium noted to be 3.4 on  admission -Give 40 mEq potassium chloride p.o. -Continue to monitor and place as needed  History of pulmonary embolus: 05/2021 -Heparin drip per pharmacy in place of until able to be evaluated GI  Hyperlipidemia -Continue atorvastatin  OSA -Continue CPAP at night  Obesity: BMI 36.63 kg/m   DVT prophylaxis: SCD Code Status: Full Disposition Plan: To be determined Consults called: Cardiology, [place] [facility], PCCM Admission status: Inpatient, likely require more than 2 midnight stay.  Clydie Braun MD [facility]   If 7PM-7AM, please contact night-coverage   10/07/2021, 3:37 PM

## 2021-10-07 NOTE — ED Triage Notes (Signed)
Pt from home with ems for resp distress. Pt was 78% when EMS arrived, does not wear oxygen at home. Pt immediately placed on CPAP with some improvement of WOB. Pt given albuterol en route. Hx of COPD and CHF. Recently received a flu and COVID vaccine.   BP 138/94 Hr89 Pt arrives alert and oriented.

## 2021-10-07 NOTE — ED Notes (Signed)
Critical lactic acid level of 2.0 given to Dr Rodena Medin

## 2021-10-07 NOTE — ED Notes (Signed)
Pt placed on 5L O2 via [facility]. Saturations maintaining at 95-97

## 2021-10-07 NOTE — ED Provider Notes (Addendum)
[facility] EMERGENCY DEPARTMENT Provider Note   CSN: [id number] Arrival date & time: 10/07/21  1018     History Chief Complaint  Patient presents with   Respiratory Distress    Ryan Glass is a 65 y.o. male.  65 year old male with prior medical history as detailed below presents for evaluation.  Patient reports gradual increase in weight over the last 4 to 5 days.  He reports approximately 5 pounds of weight gain.  He reports increased difficulty lying flat sleeping over the last several days.  He reports significant dyspnea this morning which is why he called EMS.  EMS noted room air sats of approximately 78%.  Patient placed on CPAP while in route.  Patient reports feeling improved.  Patient denies associated chest pain.  He denies recent fever or cough.  The history is provided by the patient and the EMS personnel.  Shortness of Breath Severity:  Moderate Onset quality:  Gradual Duration:  4 days Timing:  Constant Progression:  Worsening Chronicity:  Recurrent Relieved by:  Nothing Worsened by:  Nothing     Past Medical History:  Diagnosis Date   Ascending aortic aneurysm    a. CT 05/2021 showed 4.4x4.4cm TAA.   Bilateral lower extremity edema    Chronic gout without tophus    followed by dr Sharmon Revere    (06-08-2020  per pt last episode right knee 3 wks ago)   CKD (chronic kidney disease), stage II    nephrology--- [place] PA (10-29-2019 note in epic scanned in  media)   Heart failure with mid-range ejection fraction [facility] 05/27/2021   05/26/21: Left ventricular ejection fraction, by estimation, is 45 to 50%. There is severe asymmetric left ventricular hypertrophy. G1DD present.    History of COVID-19 06/07/2021   HOCM (hypertrophic obstructive cardiomyopathy) (HCC)    s/p ICD 07/2021   Hypertension    followed by cardiology, dr t. turner   (05-14-2018 nuclear study in epic , normal perfusion with nuclear ef 61%)   Left hydrocele     Low serum potassium level 04/27/2014   OSA on CPAP    per pt uses every night   Palpitations followed by dr t. Mayford Knife   06-08-2020  still feels palipations due to PVCs when exertion but not with chest pain/ discomfort   Pneumonia due to COVID-19 virus    PVC's (premature ventricular contractions) cardiologist--- dr t. turner   Status post PVC ablation by Dr. Ladona Ridgel 2019 with recurrence of frequent PVCs/bigeminy;  prior pseudobradycardia r/t pvcs   Rheumatoid arthritis involving multiple sites [facility])    rheumotology--- dr a. Sharmon Revere  ([facility] in [place])    Patient Active Problem List   Diagnosis Date Noted   VT (ventricular tachycardia) 09/08/2021   HOCM (hypertrophic obstructive cardiomyopathy) (HCC) 08/15/2021   Ventricular tachycardia 08/02/2021   Acute on chronic respiratory failure (HCC) 06/07/2021   History of COVID-19 06/07/2021   Rheumatoid arthritis involving multiple sites on Humira (HCC) 06/07/2021   On chronic prednisone therapy for Rheumatoid Arthritis 06/07/2021   CAD (coronary artery disease), non-obstructive 05/30/2021   Elevated troponin    Precordial chest pain    Heart failure with mid-range ejection fraction (HCC) 05/27/2021   LVH (left ventricular hypertrophy) due to hypertensive disease, with heart failure (HCC) 05/27/2021   Class 2 severe obesity with serious comorbidity and body mass index (BMI) of 35.0 to 35.9 in adult [facility]) 05/26/2021   Pulmonary embolism (HCC) 05/25/2021   Dyspnea on exertion  Thoracic aortic aneurysm without rupture 02/01/2021   Ascending aortic aneurysm 02/01/2021   Other hyperlipidemia 10/14/2020   Testicular swelling 05/14/2019   Pain in right knee 01/29/2019   PVC's (premature ventricular contractions)    PVC (premature ventricular contraction) 10/07/2018   Dilated aortic root (HCC)    Allergic rhinitis 01/10/2016   Chronic gout without tophus 01/10/2016   Drug therapy 01/10/2016   OSA (obstructive sleep apnea) 01/10/2016    Colonic polyp 12/14/2015   Rt groin pain 12/14/2015   Scoliosis of thoracolumbar spine 12/14/2015   Right lower quadrant abdominal pain 04/20/2015   Arthritis of knee 02/22/2015   Edema of extremities 02/22/2015   Essential hypertension 02/22/2015   Preventative health care 02/22/2015   Bradycardia by electrocardiogram 04/16/2014   Chest pain 04/16/2014    Past Surgical History:  Procedure Laterality Date   BUBBLE STUDY  08/18/2021   Procedure: BUBBLE STUDY;  Surgeon: Wendall Stade, MD;  Location: [facility] ENDOSCOPY;  Service: Cardiovascular;;   HYDROCELE EXCISION Left 06/14/2020   Procedure: LEFT  HYDROCELECTOMY ADULT;  Surgeon: Noel Christmas, MD;  Location: [facility];  Service: Urology;  Laterality: Left;   ICD IMPLANT N/A 08/07/2021   Procedure: ICD IMPLANT;  Surgeon: Marinus Maw, MD;  Location: [facility] INVASIVE CV LAB;  Service: Cardiovascular;  Laterality: N/A;   INCISIONAL HERNIA REPAIR  02-23-2016   @[facility]    LAPAROSCOPIC   LAPAROSCOPIC INGUINAL HERNIA REPAIR Bilateral 08-22-2015  @[facility]    AND UMBILICAL HERNIA REPAIR   PVC ABLATION N/A 10/07/2018   Procedure: PVC ABLATION;  Surgeon: , MD;  Location: [facility] INVASIVE CV LAB;  Service: Cardiovascular;  Laterality: N/A;   RIGHT/LEFT HEART CATH AND CORONARY ANGIOGRAPHY N/A 05/29/2021   Procedure: RIGHT/LEFT HEART CATH AND CORONARY ANGIOGRAPHY;  Surgeon: Marinus Maw, MD;  Location: [facility] INVASIVE CV LAB;  Service: Cardiovascular;  Laterality: N/A;   TEE WITHOUT CARDIOVERSION N/A 08/18/2021   Procedure: TRANSESOPHAGEAL ECHOCARDIOGRAM (TEE);  Surgeon: Kathleene Hazel, MD;  Location: [facility] ENDOSCOPY;  Service: Cardiovascular;  Laterality: N/A;   UMBILICAL HERNIA REPAIR  child       Family History  Problem Relation Age of Onset   Cardiomyopathy Mother    Heart attack Father     Social History   Tobacco Use   Smoking status: Never   Smokeless tobacco: Never  Vaping Use   Vaping Use: Never used   Substance Use Topics   Alcohol use: Yes    Alcohol/week: 5.0 - 7.0 standard drinks    Types: 5 - 7 Cans of beer per week   Drug use: Never    Home Medications Prior to Admission medications   Medication Sig Start Date End Date Taking? Authorizing Provider  Acetaminophen-Codeine 300-30 MG tablet Take 1 tablet by mouth every 12 (twelve) hours as needed. 09/25/21   [provider]  albuterol (VENTOLIN HFA) 108 (90 Base) MCG/ACT inhaler Inhale 2 puffs into the lungs every 4 (four) hours as needed for wheezing or shortness of breath. Patient taking differently: Inhale 2 puffs into the lungs See admin instructions. Inhale 2 puffs into the lungs at bedtime and an additional 2 puffs every 6 hours as needed for shortness of breath or wheezing 03/02/21   Betancourt, 09/27/21, NP  apixaban (ELIQUIS) 5 MG TABS tablet Take 1 tablet (5 mg total) by mouth 2 (two) times daily. 06/17/21   Parrett, Jarold Song, NP  aspirin EC 81 MG tablet Take 1 tablet (81 mg total) by  mouth daily. Swallow whole. 08/24/21   Tonny Bollman, MD  atorvastatin (LIPITOR) 40 MG tablet Take 1 tablet (40 mg total) by mouth daily. Patient taking differently: Take 40 mg by mouth at bedtime. 01/09/21   Quintella Reichert, MD  chlorthalidone (HYGROTON) 25 MG tablet Take 25 mg by mouth at bedtime. 04/02/21   [provider]  colchicine 0.6 MG tablet Take 1-2 tablets by mouth daily as needed (for gout flares). 01/31/16   [provider]  diclofenac (VOLTAREN) 75 MG EC tablet Take 75 mg by mouth in the morning.    [provider]  fluticasone (FLONASE) 50 MCG/ACT nasal spray Place 1 spray into both nostrils daily. Patient taking differently: Place 2 sprays into both nostrils at bedtime. 06/08/21   Lilland, Alana, DO  fluticasone-salmeterol (ADVAIR DISKUS) 100-50 MCG/ACT AEPB Inhale 1 puff into the lungs 2 (two) times daily. 10/06/21 11/05/21  Betancourt, Jarold Song, NP  folic acid (FOLVITE) 1 MG tablet Take 1 mg by mouth  daily. 01/29/20   [provider]  furosemide (LASIX) 40 MG tablet Take 1 tablet (40 mg total) by mouth as needed. Take 1 tablet by mouth daily for 3 days, then decrease to only as needed for increase weight gain Patient taking differently: Take 40 mg by mouth See admin instructions. Take 40 mg by mouth daily for 3 days, then decrease to only as needed for increase weight gain 07/11/21   Dyann Kief, PA-C  gabapentin (NEURONTIN) 600 MG tablet Take 600 mg by mouth with breakfast, with lunch, and with evening meal. 02/04/19   [provider]  HUMIRA PEN 40 MG/0.4ML PNKT Inject 40 mg into the skin every 14 (fourteen) days. 04/09/21   [provider]  hydrALAZINE (APRESOLINE) 100 MG tablet Take 1 tablet (100 mg total) by mouth every 8 (eight) hours. 09/12/21   Graciella Freer, PA-C  losartan (COZAAR) 100 MG tablet Take 1 tablet (100 mg total) by mouth daily. 06/14/21   Quintella Reichert, MD  Magnesium Oxide 400 MG CAPS Take 1 capsule (400 mg total) by mouth daily. 09/21/21   Graciella Freer, PA-C  methotrexate 2.5 MG tablet Take 15 mg by mouth every Sunday. 06/22/20   [provider]  montelukast (SINGULAIR) 10 MG tablet Take 1 tablet (10 mg total) by mouth at bedtime. 09/07/21   Betancourt, Jarold Song, NP  potassium chloride SA (KLOR-CON) 20 MEQ tablet Take 2 tablets (40 mEq total) by mouth 2 (two) times daily. 09/12/21   Graciella Freer, PA-C  predniSONE (DELTASONE) 5 MG tablet Take 5 mg by mouth daily with breakfast. 01/29/20   [provider]  sildenafil (REVATIO) 20 MG tablet Take 20 mg by mouth daily as needed (erectile dysfunction). 08/03/20   [provider]  sotalol (BETAPACE) 120 MG tablet Take 1 tablet (120 mg total) by mouth every 12 (twelve) hours. 09/12/21   Graciella Freer, PA-C  spironolactone (ALDACTONE) 25 MG tablet Take 1 tablet (25 mg total) by mouth daily. 09/13/21   Graciella Freer, PA-C    Allergies     Amlodipine besylate and Aspirin  Review of Systems   Review of Systems  Respiratory:  Positive for shortness of breath.   All other systems reviewed and are negative.  Physical Exam Updated Vital Signs BP 115/83   Pulse 94   Temp (!) 97.3 F (36.3 C) (Temporal)   Resp 16   Ht 5\' 10"  (1.778 m)   Wt 115.8 kg  SpO2 93%   BMI 36.63 kg/m   Physical Exam Vitals and nursing note reviewed.  Constitutional:      General: He is not in acute distress.    Appearance: Normal appearance. He is well-developed.  HENT:     Head: Normocephalic and atraumatic.  Eyes:     Conjunctiva/sclera: Conjunctivae normal.     Pupils: Pupils are equal, round, and reactive to light.  Cardiovascular:     Rate and Rhythm: Normal rate and regular rhythm.     Heart sounds: Normal heart sounds.  Pulmonary:     Effort: Pulmonary effort is normal. No respiratory distress.     Comments: Decreased BS at bases  Abdominal:     General: There is no distension.     Palpations: Abdomen is soft.     Tenderness: There is no abdominal tenderness.  Musculoskeletal:        General: No deformity. Normal range of motion.     Cervical back: Normal range of motion and neck supple.  Skin:    General: Skin is warm and dry.  Neurological:     General: No focal deficit present.     Mental Status: He is alert and oriented to person, place, and time.    ED Results / Procedures / Treatments   Labs (all labs ordered are listed, but only abnormal results are displayed) Labs Reviewed  CBC WITH DIFFERENTIAL/PLATELET - Abnormal; Notable for the following components:      Result Value   RBC 3.39 (*)    Hemoglobin 11.7 (*)    HCT 36.0 (*)    MCV 106.2 (*)    MCH 34.5 (*)    All other components within normal limits  URINALYSIS, ROUTINE W REFLEX MICROSCOPIC - Abnormal; Notable for the following components:   Color, Urine STRAW (*)    All other components within normal limits  RESP PANEL BY RT-PCR (FLU A&B, COVID)  ARPGX2  CULTURE, BLOOD (ROUTINE X 2)  CULTURE, BLOOD (ROUTINE X 2)  COMPREHENSIVE METABOLIC PANEL  LACTIC ACID, PLASMA  LACTIC ACID, PLASMA  BRAIN NATRIURETIC PEPTIDE  BLOOD GAS, VENOUS  I-STAT CHEM 8, ED  TROPONIN I (HIGH SENSITIVITY)  TROPONIN I (HIGH SENSITIVITY)    EKG EKG Interpretation  Date/Time:  Saturday October 07 2021 10:33:13 EDT Ventricular Rate:  88 PR Interval:  191 QRS Duration: 184 QT Interval:  423 QTC Calculation: 512 R Axis:   95 Text Interpretation: Sinus rhythm Atrial premature complex RBBB and LPFB Repol abnrm suggests ischemia, diffuse leads Confirmed by Kristine Royal [phone]) on 10/07/2021 10:52:16 AM  Radiology DG Chest Port 1 View  Result Date: 10/07/2021 CLINICAL DATA:  Shortness of breath EXAM: PORTABLE CHEST 1 VIEW COMPARISON:  09/08/2021 FINDINGS: Chronic cardiopericardial enlargement. Aortic tortuosity. Low volume chest with indistinct density at the bases. Dual-chamber pacer leads from the left in unremarkable position. Artifact from EKG leads. IMPRESSION: Low volume chest with atelectasis or infiltrate at the bases. Chronic cardiomegaly.  No edema. Electronically Signed   By: Tiburcio Pea M.D.   On: 10/07/2021 11:20    Procedures Procedures   Medications Ordered in ED Medications  furosemide (LASIX) injection 40 mg (40 mg Intravenous Given 10/07/21 1036)    ED Course  I have reviewed the triage vital signs and the nursing notes.  Pertinent labs & imaging results that were available during my care of the patient were reviewed by me and considered in my medical decision making (see chart for details).    MDM  Rules/Calculators/A&P                         CRITICAL CARE Performed by: Wynetta Fines   Total critical care time: 45 minutes  Critical care time was exclusive of separately billable procedures and treating other patients.  Critical care was necessary to treat or prevent imminent or life-threatening  deterioration.  Critical care was time spent personally by me on the following activities: development of treatment plan with patient and/or surrogate as well as nursing, discussions with consultants, evaluation of patient's response to treatment, examination of patient, obtaining history from patient or surrogate, ordering and performing treatments and interventions, ordering and review of laboratory studies, ordering and review of radiographic studies, pulse oximetry and re-evaluation of patient's condition.    MDM  MSE complete  Ryan Glass was evaluated in Emergency Department on 10/07/2021 for the symptoms described in the history of present illness. He was evaluated in the context of the global COVID-19 pandemic, which necessitated consideration that the patient might be at risk for infection with the SARS-CoV-2 virus that causes COVID-19. Institutional protocols and algorithms that pertain to the evaluation of patients at risk for COVID-19 are in a state of rapid change based on information released by regulatory bodies including the CDC and federal and state organizations. These policies and algorithms were followed during the patient's care in the ED.   Patient is presenting with complaint of significant shortness of breath.  Patient reports gradual onset of symptoms over the last several days.  He also reports gradual increase in weight.  He reports approximately 5 pounds weight gain.  Has reported significant hypoxia on their initial evaluation.  Patient was placed on CPAP and then BiPAP.  He appears to be comfortable on BiPAP.  Patient's presentation is concerning for possible CHF exacerbation.  Patient without overt evidence of infectious process from history.  However, patient's x-ray is read as possible early infiltrates.  Will cover with antibiotics.  Dr. Jens Som is aware of case and will consult (cardiology).  Patient would benefit from admission.  Medicine service is  aware of case and will evaluate for same.  1400 - Patient reported passage of bloody stool earlier this morning.  He did not report this on his initial evaluation.  Rectal reveals melanotic stool.  Guaiac is positive.  1410 - Patient appears to have gone in to [facility]. Minimally symptomatic. Hx of PAF. Will start amiodarone for rate control.     Final Clinical Impression(s) / ED Diagnoses Final diagnoses:  Dyspnea, unspecified type  Gastrointestinal hemorrhage, unspecified gastrointestinal hemorrhage type    Rx / DC Orders ED Discharge Orders     None        Wynetta Fines, MD 10/07/21 1426    Wynetta Fines, MD 10/07/21 1427

## 2021-10-07 NOTE — Progress Notes (Signed)
   10/07/21 2101  Assess: MEWS Score  Temp 99.2 F (37.3 C)  BP 106/72  Pulse Rate 80  ECG Heart Rate 83  Resp (!) 27  Level of Consciousness Alert  SpO2 97 %  O2 Device Nasal Cannula  O2 Flow Rate (L/min) 5 L/min  Assess: MEWS Score  MEWS Temp 0  MEWS Systolic 0  MEWS Pulse 0  MEWS RR 2  MEWS LOC 0  MEWS Score 2  MEWS Score Color Yellow  Treat  Pain Scale 0-10  Pain Score 0  Patients Stated Pain Goal 0  Take Vital Signs  Increase Vital Sign Frequency  Yellow: Q 2hr X 2 then Q 4hr X 2, if remains yellow, continue Q 4hrs  Escalate  MEWS: Escalate Yellow: discuss with charge nurse/RN and consider discussing with provider and RRT  Notify: Charge Nurse/RN  Name of Charge Nurse/RN Notified Jill RN  Date Charge Nurse/RN Notified 10/07/21  Time Charge Nurse/RN Notified 2104  Document  Patient Outcome Stabilized after interventions  Progress note created (see row info) Yes

## 2021-10-08 ENCOUNTER — Telehealth: Payer: Self-pay | Admitting: Registered Nurse

## 2021-10-08 ENCOUNTER — Inpatient Hospital Stay (HOSPITAL_COMMUNITY): Payer: No Typology Code available for payment source | Admitting: Anesthesiology

## 2021-10-08 ENCOUNTER — Encounter (HOSPITAL_COMMUNITY)
Admission: EM | Disposition: A | Discharge status: Home / Self Care | Payer: Self-pay | Source: Home / Self Care | Attending: Internal Medicine

## 2021-10-08 ENCOUNTER — Encounter (HOSPITAL_COMMUNITY): Payer: Self-pay | Admitting: Internal Medicine

## 2021-10-08 DIAGNOSIS — K922 Gastrointestinal hemorrhage, unspecified: Secondary | ICD-10-CM

## 2021-10-08 DIAGNOSIS — I480 Paroxysmal atrial fibrillation: Secondary | ICD-10-CM

## 2021-10-08 DIAGNOSIS — J9601 Acute respiratory failure with hypoxia: Secondary | ICD-10-CM | POA: Diagnosis not present

## 2021-10-08 DIAGNOSIS — I10 Essential (primary) hypertension: Secondary | ICD-10-CM

## 2021-10-08 DIAGNOSIS — D62 Acute posthemorrhagic anemia: Secondary | ICD-10-CM | POA: Diagnosis not present

## 2021-10-08 DIAGNOSIS — G4733 Obstructive sleep apnea (adult) (pediatric): Secondary | ICD-10-CM

## 2021-10-08 DIAGNOSIS — K259 Gastric ulcer, unspecified as acute or chronic, without hemorrhage or perforation: Secondary | ICD-10-CM

## 2021-10-08 DIAGNOSIS — K2971 Gastritis, unspecified, with bleeding: Secondary | ICD-10-CM

## 2021-10-08 DIAGNOSIS — E538 Deficiency of other specified B group vitamins: Secondary | ICD-10-CM

## 2021-10-08 DIAGNOSIS — Z86711 Personal history of pulmonary embolism: Secondary | ICD-10-CM

## 2021-10-08 DIAGNOSIS — R0600 Dyspnea, unspecified: Secondary | ICD-10-CM

## 2021-10-08 DIAGNOSIS — I421 Obstructive hypertrophic cardiomyopathy: Secondary | ICD-10-CM

## 2021-10-08 DIAGNOSIS — J189 Pneumonia, unspecified organism: Secondary | ICD-10-CM

## 2021-10-08 DIAGNOSIS — M898X6 Other specified disorders of bone, lower leg: Secondary | ICD-10-CM

## 2021-10-08 HISTORY — PX: ESOPHAGOGASTRODUODENOSCOPY: SHX5428

## 2021-10-08 HISTORY — PX: BIOPSY: SHX5522

## 2021-10-08 LAB — CBC
HCT: 28.8 % — ABNORMAL LOW (ref 39.0–52.0)
HCT: 28.6 % — ABNORMAL LOW (ref 39.0–52.0)
Hemoglobin: 9.7 g/dL — ABNORMAL LOW (ref 13.0–17.0)
Hemoglobin: 9.6 g/dL — ABNORMAL LOW (ref 13.0–17.0)
MCH: 33.9 pg (ref 26.0–34.0)
MCH: 34.3 pg — ABNORMAL HIGH (ref 26.0–34.0)
MCHC: 33.7 g/dL (ref 30.0–36.0)
MCHC: 33.6 g/dL (ref 30.0–36.0)
MCV: 100.7 fL — ABNORMAL HIGH (ref 80.0–100.0)
MCV: 102.1 fL — ABNORMAL HIGH (ref 80.0–100.0)
Platelets: 142 10*3/uL — ABNORMAL LOW (ref 150–400)
Platelets: 144 10*3/uL — ABNORMAL LOW (ref 150–400)
RBC: 2.86 MIL/uL — ABNORMAL LOW (ref 4.22–5.81)
RBC: 2.8 MIL/uL — ABNORMAL LOW (ref 4.22–5.81)
RDW: 14.3 % (ref 11.5–15.5)
RDW: 14.4 % (ref 11.5–15.5)
WBC: 8.8 10*3/uL (ref 4.0–10.5)
WBC: 7.2 10*3/uL (ref 4.0–10.5)
nRBC: 0 % (ref 0.0–0.2)
nRBC: 0 % (ref 0.0–0.2)

## 2021-10-08 LAB — BASIC METABOLIC PANEL
Anion gap: 11 (ref 5–15)
BUN: 56 mg/dL — ABNORMAL HIGH (ref 8–23)
CO2: 19 mmol/L — ABNORMAL LOW (ref 22–32)
Calcium: 8.6 mg/dL — ABNORMAL LOW (ref 8.9–10.3)
Chloride: 106 mmol/L (ref 98–111)
Creatinine, Ser: 1.72 mg/dL — ABNORMAL HIGH (ref 0.61–1.24)
GFR, Estimated: 44 mL/min — ABNORMAL LOW (ref >60)
Glucose, Bld: 147 mg/dL — ABNORMAL HIGH (ref 70–99)
Potassium: 2.9 mmol/L — ABNORMAL LOW (ref 3.5–5.1)
Sodium: 136 mmol/L (ref 135–145)

## 2021-10-08 LAB — APTT
aPTT: 78 seconds — ABNORMAL HIGH (ref 24–36)
aPTT: 33 seconds (ref 24–36)

## 2021-10-08 SURGERY — EGD (ESOPHAGOGASTRODUODENOSCOPY)
Anesthesia: Monitor Anesthesia Care

## 2021-10-08 MED ORDER — SUCRALFATE 1 GM/10ML PO SUSP: 1.0000 g | Freq: Three times a day (TID) | ORAL | Status: DC

## 2021-10-08 MED ORDER — SUCRALFATE 1 GM/10ML PO SUSP
1.0000 g | Freq: Three times a day (TID) | ORAL | Status: DC
  Administered 2021-10-08 – 2021-10-12 (×16): 1 g via ORAL
  Filled 2021-10-08 (×16): qty 10

## 2021-10-08 MED ORDER — PROPOFOL 10 MG/ML IV BOLUS
INTRAVENOUS | Status: DC | PRN
  Administered 2021-10-08: 20 mg via INTRAVENOUS

## 2021-10-08 MED ORDER — POTASSIUM CHLORIDE CRYS ER 20 MEQ PO TBCR
40.0000 meq | EXTENDED_RELEASE_TABLET | Freq: Once | ORAL | Status: AC
  Administered 2021-10-08: 40 meq via ORAL
  Filled 2021-10-08: qty 2

## 2021-10-08 MED ORDER — PANTOPRAZOLE SODIUM 40 MG PO TBEC
40.0000 mg | DELAYED_RELEASE_TABLET | Freq: Two times a day (BID) | ORAL | Status: DC
  Administered 2021-10-08 – 2021-10-12 (×9): 40 mg via ORAL
  Filled 2021-10-08 (×9): qty 1

## 2021-10-08 MED ORDER — PROTAMINE SULFATE 10 MG/ML IV SOLN
25.0000 mg | Freq: Once | INTRAVENOUS | Status: AC
  Administered 2021-10-08: 25 mg via INTRAVENOUS
  Filled 2021-10-08: qty 2.5

## 2021-10-08 MED ORDER — SODIUM CHLORIDE 0.45 % IV SOLN: INTRAVENOUS | Status: AC

## 2021-10-08 MED ORDER — PROPOFOL 500 MG/50ML IV EMUL
INTRAVENOUS | Status: DC | PRN
  Administered 2021-10-08: 125 ug/kg/min via INTRAVENOUS

## 2021-10-08 MED ORDER — SODIUM CHLORIDE 0.9 % IV SOLN: INTRAVENOUS | Status: DC

## 2021-10-08 NOTE — Progress Notes (Addendum)
[facility] PROGRESS NOTE   Ryan Glass [id number] DOB: 09/16/1956 DOA: 10/07/2021  PCP: [name], MD  Brief History/Interval Summary: 65 y.o. male with medical history significant of combined systolic and diastolic CHF, HOCM, VT s/p ICD, CAD, PAF, HTN, HLD, COPD, PE in 05/2021 on LE, OSA on CPAP who presented with complaints of shortness of breath.  Symptoms are suggestive of orthopnea and PND.  Denies having any ICD shocks to his knowledge.  Also had a productive cough with greenish sputum.  Not on oxygen at home.  Also mentioned blood in stool recently.  Upon EMS arrival patient was noted to have O2 saturations of 78% on room air.  Chest x-ray showed concern for atelectasis versus infiltrate at the bases.  Patient was given antibiotics.  Was hospitalized for further management.  Initially placed on IV heparin but then developed GI bleed so this was discontinued and reversed with protamine.  Also noted to be in atrial fibrillation with RVR.  Discussions were held with the cardiology and patient was given amiodarone.  Reason for Visit: GI bleeding.  Atrial fibrillation with RVR.  Consultants: Audiology.  Gastroenterology  Procedures:    EGD 10/30 Impression:               - The examined portions of the nasopharynx,                            oropharynx and larynx were normal.                           - Normal esophagus.                           - Non-bleeding gastric ulcer with no stigmata of                            bleeding. Biopsied. Suspect this is related to                            diclofenac use                           - Gastritis with hemorrhage. Biopsied.                           - Normal examined duodenum.   Subjective/Interval History: Patient denies any shortness of breath or chest pain this morning.  Denies any dizziness lightheadedness.  No abdominal pain.  Had bloody bowel movement at 5 AM this morning which was also dark in color.  Has not  had any further episodes of bleeding or bowel movements.  No nausea or vomiting.    Assessment/Plan:  Acute respiratory failure with hypoxia/community-acquired pneumonia X-ray suggested infiltrates bilaterally.  No evidence for aspiration.  Patient required BiPAP briefly.  Seems to be stable this morning.  On nasal cannula oxygen at 5 L/min.  Saturations in the early 90s.  Con WBC noted to be normal today.  Procalcitonin 0.93.  ABG done last night reviewed.  Continue antibiotics.  Low suspicion for PE since patient had been compliant with Eliquis.  Upper GI bleed Patient had dark stool which was blood-tinged this morning.  Hemoglobin has dropped to  9.7.  Patient was seen by gastroenterology.  Patient placed on PPI.  EGD shows evidence for gastric ulcer and gastritis with hemorrhage likely due to NSAIDs.  Discontinue all NSAIDs.  Acute blood loss anemia Drop in hemoglobin noted.  Has not required transfusion yet.  We will continue to monitor closely.  Acute kidney injury Creatinine noted to be 1.72 today.  Likely due to volume loss from GI bleeding.  Avoid nephrotoxic agents.  Monitor urine output.  Gently hydrate.  Recheck labs tomorrow.  Paroxysmal atrial fibrillation with RVR Noted to be in RVR at the time of admission.  Cardiology was consulted.  Sotalol was continued.  Metoprolol was added by cardiology.  Heart rate seems to be better controlled.  Correct electrolytes.  History of ventricular tachycardia Recently required admission in August for VT/new onset A. fib.  Underwent ICD placement.  Continue to monitor on telemetry.  History of pulmonary embolism This was in June 2022.  Looks like a small PE was detected.  No DVT was noted.  Reasonable to hold anticoagulation for a few days if needed.  Looks like gastroenterology has cleared for him to resume Eliquis starting tomorrow.  Chronic diastolic CHF/HOCM Fairly euvolemic currently.  Did get Lasix overnight.  Noted to be on  chlorthalidone spironolactone at home.  This can be resumed soon.  Prolonged QT interval Monitor on telemetry.  History of PFO Per cardiology  Essential hypertension Monitor blood pressures closely.  Hypokalemia Will be repleted.  Hyperlipidemia Continue statin.  Obstructive sleep apnea CPAP  Obesity Estimated body mass index is 32.58 kg/m as calculated from the following:   Height as of this encounter: 5\' 10"  (1.778 m).   Weight as of this encounter: 103 kg.   DVT Prophylaxis: Resume Eliquis from tomorrow.  SCDs for now. Code Status: Full code Family Communication: Discussed with patient Disposition Plan: Hopefully return home when improved  Status is: Inpatient  Remains inpatient appropriate because: GI bleeding, atrial fibrillation, acute respiratory failure with hypoxia      Medications: Scheduled:  [MAR Hold] atorvastatin  40 mg Oral QHS   [MAR Hold] gabapentin  600 mg Oral TID with meals   [MAR Hold] guaiFENesin  600 mg Oral BID   [MAR Hold] metoprolol tartrate  25 mg Oral BID   pantoprazole  40 mg Oral BID   [MAR Hold] potassium chloride  40 mEq Oral STAT   [MAR Hold] predniSONE  5 mg Oral Q breakfast   [MAR Hold] sodium chloride flush  3 mL Intravenous Q12H   [MAR Hold] sotalol  120 mg Oral Q12H   sucralfate  1 g Oral TID WC & HS   Continuous:  sodium chloride 20 mL/hr at 10/08/21 1041   [MAR Hold] cefTRIAXone (ROCEPHIN)  IV     PRN:[MAR Hold] acetaminophen **OR** [MAR Hold] acetaminophen, [MAR Hold] acetaminophen-codeine, [MAR Hold] levalbuterol, [MAR Hold] trimethobenzamide  Antibiotics: Anti-infectives (From admission, onward)    Start     Dose/Rate Route Frequency Ordered Stop   10/08/21 1530  [MAR Hold]  cefTRIAXone (ROCEPHIN) 1 g in sodium chloride 0.9 % 100 mL IVPB        (MAR Hold since Sun 10/08/2021 at 1055.Hold Reason: Transfer to a Procedural area)   1 g 200 mL/hr over 30 Minutes Intravenous Every 24 hours 10/07/21 1842     10/07/21  1345  cefTRIAXone (ROCEPHIN) 1 g in sodium chloride 0.9 % 100 mL IVPB        1 g 200 mL/hr over 30  Minutes Intravenous  Once 10/07/21 1341 10/07/21 1628   10/07/21 1345  azithromycin (ZITHROMAX) 500 mg in sodium chloride 0.9 % 250 mL IVPB  Status:  Discontinued        500 mg 250 mL/hr over 60 Minutes Intravenous  Once 10/07/21 1341 10/07/21 1424       Objective:  Vital Signs  Vitals:   10/08/21 0800 10/08/21 0849 10/08/21 1058 10/08/21 1136  BP: 117/75 113/74 129/78 91/66  Pulse: 80 95 83 86  Resp: 20  14 18   Temp: 98.7 F (37.1 C) 97.9 F (36.6 C) 98.6 F (37 C) (!) 97.2 F (36.2 C)  TempSrc: Oral Oral Temporal   SpO2: 95% 92% 93% 95%  Weight:      Height:        Intake/Output Summary (Last 24 hours) at 10/08/2021 1148 Last data filed at 10/08/2021 1128 Gross per 24 hour  Intake 995.95 ml  Output 1700 ml  Net -704.05 ml   Filed Weights   10/07/21 1025 10/07/21 2101 10/08/21 0500  Weight: 115.8 kg 102.9 kg 103 kg    General appearance: Awake alert.  In no distress Resp: Clear to auscultation bilaterally.  Normal effort Cardio: S1-S2 is normal regular.  No S3-S4.  No rubs murmurs or bruit GI: Abdomen is soft.  Nontender nondistended.  Bowel sounds are present normal.  No masses organomegaly Extremities: No edema.  Full range of motion of lower extremities. Neurologic: Alert and oriented x3.  No focal neurological deficits.    Lab Results:  Data Reviewed: I have personally reviewed following labs and imaging studies  CBC: Recent Labs  Lab 10/07/21 1031 10/07/21 1253 10/07/21 2107 10/08/21 0907  WBC 6.3  --   --  8.8  NEUTROABS 4.9  --   --   --   HGB 11.7* 11.2*  11.6* 10.5* 9.7*  HCT 36.0* 33.0*  34.0* 32.0* 28.8*  MCV 106.2*  --   --  100.7*  PLT 185  --   --  142*    Basic Metabolic Panel: Recent Labs  Lab 10/05/21 1130 10/07/21 1031 10/07/21 1253 10/08/21 0318  NA 141 138 140  141 136  K 4.4 3.9 3.4*  3.4* 2.9*  CL 108* 108 109 106   CO2 17* 19*  --  19*  GLUCOSE 138* 152* 145* 147*  BUN 25 48* 43* 56*  CREATININE 1.30* 1.13 1.10 1.72*  CALCIUM 8.9 9.0  --  8.6*    GFR: Estimated Creatinine Clearance: 51.5 mL/min (A) (by C-G formula based on SCr of 1.72 mg/dL (H)).  Liver Function Tests: Recent Labs  Lab 10/07/21 1031  AST 28  ALT 26  ALKPHOS 51  BILITOT 1.1  PROT 5.9*  ALBUMIN 3.6     Coagulation Profile: Recent Labs  Lab 10/07/21 2107  INR 1.2     BNP (last 3 results) Recent Labs    02/03/21 1515 02/06/21 1028 09/19/21 0938  PROBNP 141 47 355     Recent Results (from the past 240 hour(s))  Culture, blood (routine x 2)     Status: None (Preliminary result)   Collection Time: 10/07/21 10:30 AM   Specimen: BLOOD  Result Value Ref Range Status   Specimen Description BLOOD RIGHT ANTECUBITAL  Final   Special Requests   Final    BOTTLES DRAWN AEROBIC AND ANAEROBIC Blood Culture adequate volume   Culture   Final    NO GROWTH < 24 HOURS Performed at [facility], [address], [place], [place] [address]    Report Status PENDING  Incomplete  Resp Panel by RT-PCR (Flu A&B, Covid) Nasopharyngeal Swab     Status: None   Collection Time: 10/07/21 10:32 AM   Specimen: Nasopharyngeal Swab; Nasopharyngeal(NP) swabs in vial transport medium  Result Value Ref Range Status   SARS Coronavirus 2 by RT PCR NEGATIVE NEGATIVE Final    Comment: (NOTE) SARS-CoV-2 target nucleic acids are NOT DETECTED.  The SARS-CoV-2 RNA is generally detectable in upper respiratory specimens during the acute phase of infection. The lowest concentration of SARS-CoV-2 viral copies this assay can detect is 138 copies/mL. A negative result does not preclude SARS-Cov-2 infection and should not be used as the sole basis for treatment or other patient management decisions. A negative result may occur with  improper specimen collection/handling, submission of specimen other than nasopharyngeal swab, presence of viral  mutation(s) within the areas targeted by this assay, and inadequate number of viral copies(<138 copies/mL). A negative result must be combined with clinical observations, patient history, and epidemiological information. The expected result is Negative.  Fact Sheet for Patients:  EntrepreneurPulse.com.au  Fact Sheet for Healthcare Providers:  IncredibleEmployment.be  This test is no t yet approved or cleared by the [place] FDA and  has been authorized for detection and/or diagnosis of SARS-CoV-2 by FDA under an Emergency Use Authorization (EUA). This EUA will remain  in effect (meaning this test can be used) for the duration of the COVID-19 declaration under Section 564(b)(1) of the Act, 21 U.S.C.section 360bbb-3(b)(1), unless the authorization is terminated  or revoked sooner.       Influenza A by PCR NEGATIVE NEGATIVE Final   Influenza B by PCR NEGATIVE NEGATIVE Final    Comment: (NOTE) The Xpert Xpress SARS-CoV-2/FLU/RSV plus assay is intended as an aid in the diagnosis of influenza from Nasopharyngeal swab specimens and should not be used as a sole basis for treatment. Nasal washings and aspirates are unacceptable for Xpert Xpress SARS-CoV-2/FLU/RSV testing.  Fact Sheet for Patients: EntrepreneurPulse.com.au  Fact Sheet for Healthcare Providers: IncredibleEmployment.be  This test is not yet approved or cleared by the [place] FDA and has been authorized for detection and/or diagnosis of SARS-CoV-2 by FDA under an Emergency Use Authorization (EUA). This EUA will remain in effect (meaning this test can be used) for the duration of the COVID-19 declaration under Section 564(b)(1) of the Act, 21 U.S.C. section 360bbb-3(b)(1), unless the authorization is terminated or revoked.  Performed at [facility], [place] [address] [place], [place] [address]   Culture, blood (routine x 2)      Status: None (Preliminary result)   Collection Time: 10/07/21  9:07 PM   Specimen: BLOOD RIGHT HAND  Result Value Ref Range Status   Specimen Description BLOOD RIGHT HAND  Final   Special Requests AEROBIC BOTTLE ONLY Blood Culture adequate volume  Final   Culture   Final    NO GROWTH < 12 HOURS Performed at [facility], [place] [address] [place], [place] [address]    Report Status PENDING  Incomplete      Radiology Studies: DG Chest Port 1 View  Result Date: 10/07/2021 CLINICAL DATA:  Shortness of breath EXAM: PORTABLE CHEST 1 VIEW COMPARISON:  09/08/2021 FINDINGS: Chronic cardiopericardial enlargement. Aortic tortuosity. Low volume chest with indistinct density at the bases. Dual-chamber pacer leads from the left in unremarkable position. Artifact from EKG leads. IMPRESSION: Low volume chest with atelectasis or infiltrate at the bases. Chronic  cardiomegaly.  No edema. Electronically Signed   By: Jorje Guild M.D.   On: 10/07/2021 11:20       LOS: 1 day   [facility] Pager on [email or web address]  10/08/2021, 11:48 AM

## 2021-10-08 NOTE — Progress Notes (Signed)
Notified by CCMD that pt's HR in 150s. Pt found be on the edge of bed  with dinner tray. Pt denied any palpitation. Pt asymptomatic. But verbalized being weak. Notified  Dr Mahala Menghini. Order received to give pt Metoprolol early( scheduled at 2200).and recheck VS in 25 minutes. If HR still stays elevated, reach out to Night team.

## 2021-10-08 NOTE — Progress Notes (Incomplete)
°   10/08/21 1811  Assess: MEWS Score  Temp 98 F (36.7 C)  BP (!) 148/133  Pulse Rate (!) 125  ECG Heart Rate (!) 139  Resp 19  Level of Consciousness Alert  SpO2 94 %  O2 Device Nasal Cannula  O2 Flow Rate (L/min) 5 L/min  Assess: MEWS Score  MEWS Temp 0  MEWS Systolic 0  MEWS Pulse 3  MEWS RR 0  MEWS LOC 0  MEWS Score 3  MEWS Score Color Yellow  Assess: if the MEWS score is Yellow or Red  Were vital signs taken at a resting state? Yes  Focused Assessment No change from prior assessment  Early Detection of Sepsis Score *See Row Information* Low  MEWS guidelines implemented *See Row Information* No, previously yellow, continue vital signs every 4 hours  Treat  MEWS Interventions Administered scheduled meds/treatments

## 2021-10-08 NOTE — Progress Notes (Addendum)
ANTICOAGULATION CONSULT NOTE  Pharmacy Consult for heparin Indication: atrial fibrillation, PE 05/2021  Allergies  Allergen Reactions   Amlodipine Besylate Swelling and Other (See Comments)    Leg swelling with 10 mg daily am dosing; decreased with BID 5 mg dosing   Aspirin Itching    Patient Measurements: Height: 5\' 10"  (177.8 cm) Weight: 102.9 kg (226 lb 13.7 oz) IBW/kg (Calculated) : 73 Heparin Dosing Weight: 98.6 kg  Vital Signs: Temp: 98.4 F (36.9 C) (10/30 0438) Temp Source: Axillary (10/30 0438) BP: 122/73 (10/30 0438) Pulse Rate: 64 (10/30 0438)  Labs: Recent Labs    10/07/21 1031 10/07/21 1231 10/07/21 1253 10/07/21 2107 10/08/21 0318  HGB 11.7*  --  11.2*  11.6* 10.5*  --   HCT 36.0*  --  33.0*  34.0* 32.0*  --   PLT 185  --   --   --   --   APTT  --   --   --  35 78*  LABPROT  --   --   --  15.0  --   INR  --   --   --  1.2  --   HEPARINUNFRC  --   --   --  >1.10*  --   CREATININE 1.13  --  1.10  --  1.72*  TROPONINIHS 12 12  --   --   --      Estimated Creatinine Clearance: 51.5 mL/min (A) (by C-G formula based on SCr of 1.72 mg/dL (H)).  Assessment: 65 yo M with pAF (PTA eliquis) coming in with acute hypoxic resp failure. Also with melena + hematochezia. Hgb 11 from 14 back in Sept. FOBT+. Cards rec to do heparin gtt while GI work up pending. Holding PTA eliquis, last dose yesterday at 1800.  Heparin level >1.1 (affected by Eliquis), aPTT 78 sec (therapeutic) on gtt at 1400 units/hr. No bleeding noted.  Goal of Therapy:  Heparin level 0.3-0.7 units/ml aPTT 66-102 seconds Monitor platelets by anticoagulation protocol: Yes   Plan:  Continue heparin infusion at 1400 units/hr Will f/u confirmatory aPTT in 6 hours  09-28-1977, PharmD, BCPS Please see amion for complete clinical pharmacist phone list 10/08/2021 4:50 AM  Addendum (0600): Contacted by Dr. 10/10/2021 - pt with large bloody BM. Heparin gtt discontinued and order for heparin reversal  with protamine. Noted that pt may need to be considered for IVC filter.  Plan:  Protamine 25mg  x 1  F/u 2 hr PTT  Julian Reil, PharmD, BCPS Please see amion for complete clinical pharmacist phone list 10/08/2021 6:07 AM

## 2021-10-08 NOTE — Transfer of Care (Signed)
Immediate Anesthesia Transfer of Care Note  Patient: Ryan Glass  Procedure(s) Performed: ESOPHAGOGASTRODUODENOSCOPY (EGD) BIOPSY  Patient Location: PACU  Anesthesia Type:MAC  Level of Consciousness: sedated  Airway & Oxygen Therapy: Patient Spontanous Breathing and Patient connected to face mask oxygen  Post-op Assessment: Report given to RN and Post -op Vital signs reviewed and stable  Post vital signs: Reviewed and stable  Last Vitals:  Vitals Value Taken Time  BP 84/66 10/08/21 1136  Temp    Pulse 75 10/08/21 1138  Resp 16 10/08/21 1138  SpO2 91 % 10/08/21 1138  Vitals shown include unvalidated device data.  Last Pain:  Vitals:   10/08/21 1058  TempSrc: Temporal  PainSc: 0-No pain      Patients Stated Pain Goal: 0 (74/12/87 8676)  Complications: No notable events documented.

## 2021-10-08 NOTE — Anesthesia Preprocedure Evaluation (Addendum)
Anesthesia Evaluation  Patient identified by MRN, date of birth, ID band Patient awake    Reviewed: Allergy & Precautions, NPO status , Patient's Chart, lab work & pertinent test results, reviewed documented beta blocker date and time   Airway Mallampati: II  TM Distance: >3 FB Neck ROM: Full    Dental  (+) Dental Advisory Given, Edentulous Upper   Pulmonary sleep apnea and Continuous Positive Airway Pressure Ventilation ,    Pulmonary exam normal breath sounds clear to auscultation       Cardiovascular hypertension, Pt. on medications and Pt. on home beta blockers + CAD, + Peripheral Vascular Disease (Ascending aneurysm) and +CHF  Normal cardiovascular exam+ Cardiac Defibrillator  Rhythm:Regular Rate:Normal  HOCM  TEE 08/18/21: 1. Will have Dr Burt Knack review regarding closure of PFO in regard to  patients hypoxemai.  2. Left ventricular ejection fraction, by estimation, is 55 to 60%. The  left ventricle has normal function. There is mild left ventricular  hypertrophy. Left ventricular diastolic function could not be evaluated.  3. AICD wires in RV/RA. Right ventricular systolic function is moderately  reduced. The right ventricular size is moderately enlarged.  4. R/L upper pulmonary veins and LLPV normal Unable to visualize RLPV.  Left atrial size was mildly dilated. No left atrial/left atrial appendage  thrombus was detected.  5. Evidence of atrial level shunting detected by color flow Doppler.  Agitated saline contrast bubble study was positive with shunting observed  within 3-6 cardiac cycles suggestive of interatrial shunt.  6. Right atrial size was mildly dilated.  7. A small pericardial effusion is present. The pericardial effusion is  anterior to the right ventricle.  8. The mitral valve is grossly normal. Mild mitral valve regurgitation.  9. The aortic valve is tricuspid. Aortic valve regurgitation is mild.   10. Aortic dilatation noted. 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 There is mild dilatation of the ascending aorta,  measuring 41 mm.    Neuro/Psych negative neurological ROS     GI/Hepatic negative GI ROS, Neg liver ROS,   Endo/Other  Obesity   Renal/GU Renal InsufficiencyRenal disease     Musculoskeletal  (+) Arthritis , Rheumatoid disorders,    Abdominal   Peds  Hematology  (+) Blood dyscrasia (Thrombocytopenia; Eliquis), anemia ,   Anesthesia Other Findings Day of surgery medications reviewed with the patient.  Reproductive/Obstetrics                            Anesthesia Physical Anesthesia Plan  ASA: 4  Anesthesia Plan: MAC   Post-op Pain Management:    Induction: Intravenous  PONV Risk Score and Plan: 1 and Propofol infusion and Treatment may vary due to age or medical condition  Airway Management Planned: Nasal Cannula and Natural Airway  Additional Equipment:   Intra-op Plan:   Post-operative Plan:   Informed Consent: I have reviewed the patients History and Physical, chart, labs and discussed the procedure including the risks, benefits and alternatives for the proposed anesthesia with the patient or authorized representative who has indicated his/her understanding and acceptance.     Dental advisory given  Plan Discussed with: CRNA and Anesthesiologist  Anesthesia Plan Comments:         Anesthesia Quick Evaluation

## 2021-10-08 NOTE — Telephone Encounter (Signed)
Reviewed Epic patient with worsening anemia on labs today.  EGD results from this am  Findings:      The examined portions of the nasopharynx, oropharynx and larynx were       normal.      The examined esophagus was normal.      One non-bleeding cratered gastric ulcer with no stigmata of bleeding was       found in the prepyloric region of the stomach. The lesion was 10 mm in       largest dimension. Biopsies were taken with a cold forceps for       histology. Estimated blood loss was minimal.      Scattered moderate inflammation with hemorrhage characterized by       erosions, erythema and friability was found in the gastric body and in       the gastric antrum. Biopsies were taken with a cold forceps for       Helicobacter pylori testing. Estimated blood loss was minimal.      The exam of the stomach was otherwise normal.      The examined duodenum was normal. Impression:               - The examined portions of the nasopharynx,                            oropharynx and larynx were normal.                           - Normal esophagus.                           - Non-bleeding gastric ulcer with no stigmata of                            bleeding. Biopsied. Suspect this is related to                            diclofenac use                           - Gastritis with hemorrhage. Biopsied.                           - Normal examined duodenum. Recommendation:           - Return patient to hospital ward for ongoing care.                           - Clear liquid diet today. Can advance to regular                            diet tomorrow if no evidence of recurrent bleeding.                           - Resume Eliquis (apixaban) at prior dose tomorrow.                            Would prefer to hold off on heparin gtt, but if  patient very high risk for embolism/VTE, okay to                            resume in 2-3 hours.                           - Await pathology  results.                           - Use Protonix (pantoprazole) 40 mg PO BID for 8                            weeks, then once daily indefinitely.                           - Use sucralfate suspension 1 gram PO QID for 4                            weeks.                           - Perform an H. pylori serology today.                           - Avoid all NSAIDs indefinitely                           - Repeat upper endoscopy in 8 weeks to check                            healing. This will need to be performed in the                            hospital setting due to patient's cardiac                            comorbidities                           - Our office will contact him to schedule a follow                            up clinic visit prior to the procedure. Procedure Code(s):        --- Professional ---                           [phone], Esophagogastroduodenoscopy, flexible,                            transoral; with biopsy, single or multiple Diagnosis Code(s):        --- Professional ---                           K25.9, Gastric ulcer, unspecified as acute or  chronic, without hemorrhage or perforation                           K29.71, Gastritis, unspecified, with bleeding CPT copyright 2019 [employer]. All rights reserved. The codes documented in this report are preliminary and upon coder review may  be revised to meet current compliance requirements. Scott E. Candis Schatz, MD 10/08/2021 11:41:04 AM

## 2021-10-08 NOTE — Progress Notes (Incomplete)
HR 86

## 2021-10-08 NOTE — Anesthesia Postprocedure Evaluation (Signed)
Anesthesia Post Note  Patient: Ryan Glass  Procedure(s) Performed: ESOPHAGOGASTRODUODENOSCOPY (EGD) BIOPSY     Patient location during evaluation: Endoscopy Anesthesia Type: MAC Level of consciousness: awake and alert Pain management: pain level controlled Vital Signs Assessment: post-procedure vital signs reviewed and stable Respiratory status: spontaneous breathing, nonlabored ventilation, respiratory function stable and patient connected to nasal cannula oxygen Cardiovascular status: stable and blood pressure returned to baseline Postop Assessment: no apparent nausea or vomiting Anesthetic complications: no   No notable events documented.  Last Vitals:  Vitals:   10/08/21 1900 10/08/21 1911  BP:  108/69  Pulse: 86 82  Resp:    Temp:    SpO2: 90% 92%    Last Pain:  Vitals:   10/08/21 1811  TempSrc: Oral  PainSc:                  Catalina Gravel

## 2021-10-08 NOTE — Progress Notes (Addendum)
Progress Note  Patient Name: Ryan Glass Date of Encounter: 10/08/2021  [facility] Cardiologist: [name], MD   Subjective   Currently in Endo for EGD/Colonscopy for GIB.  Hbg dropped to 9.7 today. O2 sats 93% on 5L O2  Inpatient Medications    Scheduled Meds:  [MAR Hold] atorvastatin  40 mg Oral QHS   [MAR Hold] gabapentin  600 mg Oral TID with meals   [MAR Hold] guaiFENesin  600 mg Oral BID   [MAR Hold] metoprolol tartrate  25 mg Oral BID   pantoprazole  40 mg Oral BID   [MAR Hold] potassium chloride  40 mEq Oral STAT   [MAR Hold] predniSONE  5 mg Oral Q breakfast   [MAR Hold] sodium chloride flush  3 mL Intravenous Q12H   [MAR Hold] sotalol  120 mg Oral Q12H   sucralfate  1 g Oral TID WC & HS   Continuous Infusions:  sodium chloride 20 mL/hr at 10/08/21 1041   [MAR Hold] cefTRIAXone (ROCEPHIN)  IV     PRN Meds: [MAR Hold] acetaminophen **OR** [MAR Hold] acetaminophen, [MAR Hold] acetaminophen-codeine, [MAR Hold] levalbuterol, [MAR Hold] trimethobenzamide   Vital Signs    Vitals:   10/08/21 0800 10/08/21 0849 10/08/21 1058 10/08/21 1136  BP: 117/75 113/74 129/78 91/66  Pulse: 80 95 83 86  Resp: 20  14 18   Temp: 98.7 F (37.1 C) 97.9 F (36.6 C) 98.6 F (37 C) (!) 97.2 F (36.2 C)  TempSrc: Oral Oral Temporal   SpO2: 95% 92% 93% 95%  Weight:      Height:        Intake/Output Summary (Last 24 hours) at 10/08/2021 1143 Last data filed at 10/08/2021 1128 Gross per 24 hour  Intake 995.95 ml  Output 1700 ml  Net -704.05 ml   Last 3 Weights 10/08/2021 10/07/2021 10/07/2021  Weight (lbs) 227 lb 1.2 oz 226 lb 13.7 oz 255 lb 4.7 oz  Weight (kg) 103 kg 102.9 kg 115.8 kg      Telemetry    NSR - Personally Reviewed  ECG    No new EKG to review - Personally Reviewed  Physical Exam   GEN: No acute distress.   Neck: No JVD Cardiac: RRR, no murmurs, rubs, or gallops.  Respiratory: Clear to auscultation bilaterally. GI: Soft, nontender,  non-distended  MS: No edema; No deformity. Neuro:  Nonfocal  Psych: Normal affect   Labs    High Sensitivity Troponin:   Recent Labs  Lab 09/08/21 1200 09/08/21 1400 10/07/21 1031 10/07/21 1231  TROPONINIHS 15 15 12 12       Chemistry Recent Labs  Lab 10/05/21 1130 10/07/21 1031 10/07/21 1253 10/08/21 0318  NA 141 138 140  141 136  K 4.4 3.9 3.4*  3.4* 2.9*  CL 108* 108 109 106  CO2 17* 19*  --  19*  GLUCOSE 138* 152* 145* 147*  BUN 25 48* 43* 56*  CREATININE 1.30* 1.13 1.10 1.72*  CALCIUM 8.9 9.0  --  8.6*  PROT  --  5.9*  --   --   ALBUMIN  --  3.6  --   --   AST  --  28  --   --   ALT  --  26  --   --   ALKPHOS  --  51  --   --   BILITOT  --  1.1  --   --   GFRNONAA  --  >60  --  44*  ANIONGAP  --  11  --  11     Hematology Recent Labs  Lab 10/07/21 1031 10/07/21 1253 10/07/21 2107 10/08/21 0907  WBC 6.3  --   --  8.8  RBC 3.39*  --   --  2.86*  HGB 11.7* 11.2*  11.6* 10.5* 9.7*  HCT 36.0* 33.0*  34.0* 32.0* 28.8*  MCV 106.2*  --   --  100.7*  MCH 34.5*  --   --  33.9  MCHC 32.5  --   --  33.7  RDW 14.3  --   --  14.3  PLT 185  --   --  142*    BNP Recent Labs  Lab 10/07/21 1031  BNP 33.9     DDimer No results for input(s): DDIMER in the last 168 hours.   CHA2DS2-VASc Score = 6  This indicates a 9.7% annual risk of stroke. The patient's score is based upon: CHF History: 1 HTN History: 1 Diabetes History: 0 Stroke History: 2 Vascular Disease History: 1 Age Score: 1 Gender Score: 0  Radiology    DG Chest Port 1 View  Result Date: 10/07/2021 CLINICAL DATA:  Shortness of breath EXAM: PORTABLE CHEST 1 VIEW COMPARISON:  09/08/2021 FINDINGS: Chronic cardiopericardial enlargement. Aortic tortuosity. Low volume chest with indistinct density at the bases. Dual-chamber pacer leads from the left in unremarkable position. Artifact from EKG leads. IMPRESSION: Low volume chest with atelectasis or infiltrate at the bases. Chronic  cardiomegaly.  No edema. Electronically Signed   By: Tiburcio Pea M.D.   On: 10/07/2021 11:20    Cardiac Studies   TEE 08/2021: 1. Will have Dr Excell Seltzer review regarding closure of PFO in regard to  patients hypoxemai.   2. Left ventricular ejection fraction, by estimation, is 55 to 60%. The  left ventricle has normal function. There is mild left ventricular  hypertrophy. Left ventricular diastolic function could not be evaluated.   3. AICD wires in RV/RA. Right ventricular systolic function is moderately  reduced. The right ventricular size is moderately enlarged.   4. R/L upper pulmonary veins and LLPV normal Unable to visualize RLPV.  Left atrial size was mildly dilated. No left atrial/left atrial appendage  thrombus was detected.   5. Evidence of atrial level shunting detected by color flow Doppler.  Agitated saline contrast bubble study was positive with shunting observed  within 3-6 cardiac cycles suggestive of interatrial shunt.   6. Right atrial size was mildly dilated.   7. A small pericardial effusion is present. The pericardial effusion is  anterior to the right ventricle.   8. The mitral valve is grossly normal. Mild mitral valve regurgitation.   9. The aortic valve is tricuspid. Aortic valve regurgitation is mild.  10. Aortic dilatation noted. 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 There is mild dilatation of the ascending aorta,  measuring 41 mm.   Conclusion(s)/Recommendation(s): Unable to perform while anesthetized in  endoscopy but consider r/o component  of platypnea orthodeoxia with bubble study in standing and supine  positions in office.    Echocardiogram 07/2021: 1. Left ventricular ejection fraction, by estimation, is 60 to 65%. The  left ventricle has normal function. The left ventricle has no regional  wall motion abnormalities. The left ventricular internal cavity size was  mildly dilated. There is mild left  ventricular  hypertrophy. Left ventricular diastolic parameters are  consistent with Grade I diastolic dysfunction (impaired relaxation).   2. Right ventricular systolic function  is mildly reduced. The right  ventricular size is mildly enlarged.   3. Left atrial size was mildly dilated.   4. The mitral valve is normal in structure. No evidence of mitral valve  regurgitation. No evidence of mitral stenosis.   5. The aortic valve is tricuspid. Aortic valve regurgitation is not  visualized. Mild aortic valve sclerosis is present, with no evidence of  aortic valve stenosis.   6. Aortic dilatation noted. There is moderate dilatation of the aortic  root, measuring 46 mm. There is mild dilatation of the ascending aorta,  measuring 40 mm.   7. Agitated saline contrast bubble study was positive with shunting  observed within 3-6 cardiac cycles suggestive of interatrial shunt.   R/LHC 05/2021: Prox RCA lesion is 20% stenosed. Prox Cx to Mid Cx lesion is 20% stenosed. Mid LAD lesion is 30% stenosed.   Mild non-obstructive CAD Normal right and left heart pressures   Recommendations: Medical management of mild CAD. Will resume IV heparin 6 hours post sheath pull given acute PE. Would transition to Eliquis or Xarelto tomorrow.   Patient Profile     65 y.o. 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 Rodena Medin.    Assessment & Plan    Acute hypoxic respiratory failure in patient with chronic combined CHF: Patient has had complaints of DOE since 04/2021.  He has followed with pulmonology with history of PE 05/2021.  Recent theory was that PFO was contributing to his shortness of breath and intermittent hypoxia.  Initially planned for closure 09/06/2021 however this  was canceled due to elevated PVC burden.   - Now presenting with hypoxia overnight.   - He reports he was unable to tolerate his CPAP.  O2 sats dropped to 77% prompting EMS activation.   - He was maintaining O2 sat of 100% in the ED on BiPAP.   - Chest x-ray without acute findings.  BNP WNL.   - Recurrent PE less likely given compliance with Eliquis. - admitted with GIB and Hbg down to 9.7 which could be contributing - he does not appear volume overloaded on exam today - hold diuretics and ACE inhibitor given AKI - Recommend anticoagulation with heparin gtt. for management of PE 05/2021 pending further GI evaluation.   Paroxysmal atrial fibrillation: Noted to have intermittent episodes of paroxysmal A. fib in the ED with rates up to 140s at times.  Overall asymptomatic to palpitations.   - He is anticoagulated with Eliquis. - continue Lopressor 25mg  BID added this admit - Continue sotalol 120mg  BID - Recommend heparin gtt. pending further GI work-up.   GI bleed: Patient presented with melena and hematochezia.  No prior history of GI bleed.  He is on Eliquis for history of A. fib and PE 05/2021.   - Hemoglobin down to 9.7 from 14 on 09/08/2021.  FOBT positive.. - Currently on IV Heparin for hx of PE - currently in endo getting EGD/colonoscopy   HCM: Noted on cardiac MR 07/2021 to further evaluate VT.   - Now s/p ICD 07/2021.   History of VT:  - Required admission 07/2021 for VT/new onset A. fib and underwent ICD implantation that admission.  -Had subsequent hospitalization 08/2021 for VT storm for which s-he was started  on sotalol -Continue sotalol 120mg  BID for now -Discussed with Dr. Ladona Ridgel with EP and recommended continuation of Sotolol>>started on Lopressor this admit -not a candidate for Amio   PFO: Noted to have interatrial shunting on TEE 08/2021.   - Initially planned to undergo closure with Dr. Excell Seltzer 09/06/2021 however it appears this was canceled due to increased PVC burden. -  Anticipate outpatient follow-up with Dr. Excell Seltzer to determine timing of PFO closure.   HTN: BP overall stable at 129/3mmHg this am -Chlorthalidone, losartan and spironolactone on hold due to AKI -Restart hydralazine 100 mg every 8 hours if BP remains stable   HLD: LDL 61 05/2021 -Continue atorvastatin  Hypokalemia: -Potassium 2.9 today -Replete per TRH to keep potassium >4 and mag >2 history of ventricular arrhythmias and atrial fibrillation    I have spent a total of 30 minutes with patient reviewing 2D echo, office and hospital notes, Structural heart consult note , telemetry, EKGs, labs and examining patient as well as establishing an assessment and plan that was discussed with the patient.  > 50% of time was spent in direct patient care.     For questions or updates, please contact [facility] Please consult [email or web address] for contact info under         Signed, [name], MD  10/08/2021, 11:43 AM

## 2021-10-08 NOTE — Op Note (Signed)
[facility] Patient Name: Ryan Glass Procedure Date : 10/08/2021 MRN: [id number] Attending MD: Gladstone Pih. Candis Schatz , MD Date of Birth: Feb 19, 1956 CSN: [id number] Age: 65 Admit Type: Inpatient Procedure:                Upper GI endoscopy Indications:              Suspected upper gastrointestinal bleeding Providers:                Nicki Reaper E. Candis Schatz, MD, Doristine Johns, RN,                            Jaci Carrel, RN, [facility] Technician,                            Technician Referring MD:              Medicines:                Monitored Anesthesia Care Complications:            No immediate complications. Estimated Blood Loss:     Estimated blood loss was minimal. Procedure:                Pre-Anesthesia Assessment:                           - Prior to the procedure, a History and Physical                            was performed, and patient medications and                            allergies were reviewed. The patient's tolerance of                            previous anesthesia was also reviewed. The risks                            and benefits of the procedure and the sedation                            options and risks were discussed with the patient.                            All questions were answered, and informed consent                            was obtained. Prior Anticoagulants: The patient has                            taken heparin, last dose was day of procedure. ASA                            Grade Assessment: IV - A patient with severe  systemic disease that is a constant threat to life.                            After reviewing the risks and benefits, the patient                            was deemed in satisfactory condition to undergo the                            procedure.                           After obtaining informed consent, the endoscope was                            passed under direct vision.  Throughout the                            procedure, the patient's blood pressure, pulse, and                            oxygen saturations were monitored continuously. The                            [id number]-[id number] ([id number]) Olympus endoscope was introduced                            through the mouth, and advanced to the third part                            of duodenum. The upper GI endoscopy was                            accomplished without difficulty. The patient                            tolerated the procedure well. Scope In: Scope Out: Findings:      The examined portions of the nasopharynx, oropharynx and larynx were       normal.      The examined esophagus was normal.      One non-bleeding cratered gastric ulcer with no stigmata of bleeding was       found in the prepyloric region of the stomach. The lesion was 10 mm in       largest dimension. Biopsies were taken with a cold forceps for       histology. Estimated blood loss was minimal.      Scattered moderate inflammation with hemorrhage characterized by       erosions, erythema and friability was found in the gastric body and in       the gastric antrum. Biopsies were taken with a cold forceps for       Helicobacter pylori testing. Estimated blood loss was minimal.      The exam of the stomach was otherwise normal.      The examined duodenum was normal. Impression:               -  The examined portions of the nasopharynx,                            oropharynx and larynx were normal.                           - Normal esophagus.                           - Non-bleeding gastric ulcer with no stigmata of                            bleeding. Biopsied. Suspect this is related to                            diclofenac use                           - Gastritis with hemorrhage. Biopsied.                           - Normal examined duodenum. Recommendation:           - Return patient to hospital ward for ongoing care.                            - Clear liquid diet today. Can advance to regular                            diet tomorrow if no evidence of recurrent bleeding.                           - Resume Eliquis (apixaban) at prior dose tomorrow.                            Would prefer to hold off on heparin gtt, but if                            patient very high risk for embolism/VTE, okay to                            resume in 2-3 hours.                           - Await pathology results.                           - Use Protonix (pantoprazole) 40 mg PO BID for 8                            weeks, then once daily indefinitely.                           - Use sucralfate suspension 1 gram PO QID for 4  weeks.                           - Perform an H. pylori serology today.                           - Avoid all NSAIDs indefinitely                           - Repeat upper endoscopy in 8 weeks to check                            healing. This will need to be performed in the                            hospital setting due to patient's cardiac                            comorbidities                           - Our office will contact him to schedule a follow                            up clinic visit prior to the procedure. Procedure Code(s):        --- Professional ---                           [phone], Esophagogastroduodenoscopy, flexible,                            transoral; with biopsy, single or multiple Diagnosis Code(s):        --- Professional ---                           K25.9, Gastric ulcer, unspecified as acute or                            chronic, without hemorrhage or perforation                           K29.71, Gastritis, unspecified, with bleeding CPT copyright 2019 American Medical Association. All rights reserved. The codes documented in this report are preliminary and upon coder review may  be revised to meet current compliance requirements. Milah Recht E. Candis Schatz,  MD 10/08/2021 11:41:04 AM This report has been signed electronically. Number of Addenda: 0

## 2021-10-08 NOTE — Telephone Encounter (Signed)
Patient contacted NP via telephone this am.  EGD planned for today to try to determine source of blood.  Patient asked that HR be notified of hospitalization as he did not think he would be discharged today.  Patient notified HR was notified of hospitalization yesterday.  Discussed with patient will continue to review epic and follow up with him via telephone this week.  Patient reported currently NPO awaiting procedure  Patient A&Ox3 spoke full sentences without difficulty no cough/throat clearing or nasal congestion noted.  Patient verbalized understanding information/instructions, agreed with plan of care and had no further questions at this time.

## 2021-10-08 NOTE — Telephone Encounter (Signed)
Component Ref Range & Units 3 d ago  (10/05/21) 2 wk ago  (09/19/21) 3 wk ago  (09/13/21) 3 wk ago  (09/12/21) 3 wk ago  (09/12/21) 3 wk ago  (09/11/21) 4 wk ago  (09/10/21)  Glucose 70 - 99 mg/dL 138 High   75 CM  86 CM  77 CM  85 CM  81 CM  87 CM   BUN 8 - 27 mg/dL 25  18  39 High   23 R  21 R  17 R  16 R   Creatinine, Ser 0.76 - 1.27 mg/dL 1.30 High   1.22  1.90 High   1.28 High  R  1.21 R  1.28 High  R  1.12 R   eGFR >59 mL/min/1.73 61  66  39 Low        BUN/Creatinine Ratio 10 - 24 19  15  21        Sodium 134 - 144 mmol/L 141  140  137  134 Low  R  134 Low  R  134 Low  R  136 R   Potassium 3.5 - 5.2 mmol/L 4.4  4.3  4.7  3.3 Low  R  3.3 Low  R  3.4 Low  R  3.5 R   Chloride 96 - 106 mmol/L 108 High   106  103  104 R  104 R  102 R  104 R   CO2 20 - 29 mmol/L 17 Low   21  18 Low   21 Low  R  22 R  23 R  23 R   Calcium 8.6 - 10.2 mg/dL 8.9  9.6  9.7  9.6 R  9.3 R  9.0 R  9.5 R   Resulting Agency  [facility] [place] [facility] [place] [facility] [place] [facility]  Creatinine increasing slightly will encourage patient to drink more water as having URI symptoms also.  Potassium normal.  Dr Erin Fulling agreed with change to Advair Diskus from [facility] due to non coverage by insurance new Rx sent to patient preferred pharmacy See tcon 10/06/21.

## 2021-10-08 NOTE — Significant Event (Signed)
Patient with large dark red bloody BM.  1) stopping heparin drip 2) Protamine per pharmd 3) draw CBC now 4) type and screen already done it looks like 5) looks like GI already consulted by admitting team 6) pt already on clear liquid diet 7) holding diuretics 8) holding losartan (also looks like hes got mild AKI this morning with creat of 1.7) 9) day team may wish to consider IR consult for IVC filter depending on GI recs, duration off anticoagulation, etc.

## 2021-10-08 NOTE — Telephone Encounter (Signed)
See new tcon 10/08/21 patient hospitalized for GI bleed and hypoxia.

## 2021-10-08 NOTE — Consult Note (Signed)
[facility] Consult: 8:17 AM 10/08/2021  LOS: 1 day    Referring Provider: Dr Barnie Del  Primary Care Physician:  [name], MD in [facility] Primary Gastroenterologist:  Dr. Devona Konig at [facility]     Reason for Consultation: Bloody bowel movement.   HPI: Ryan Glass is a 65 y.o. male.  PMH CAD.  CHF.  HOCM.  V. tach.  S/p ICD.  PE.  PAF.  Chronic Eliquis.  COPD.  OSA on CPAP.  PVCs, status post PVC ablation with recurrence.  TEE 08/18/2021 with LVEF 55 to 60%, positive bubble study suggesting intra-arterial shunt, small pericardial effusion, mild mitral and aortic regurgitation, moderate aortic root and ascending aorta dilation.  COVID-19 pneumonia summer 2022.  Rheumatoid arthritis on Voltaren, Methotrexate.  Colonoscopy w polypectomy remotely.  Colonoscopy in 2019 with no recurrent polyps.  Repeat colonoscopy delayed as patient was out of town but EGD was rescheduled for May 2023.  Presented to ED yesterday morning complaining of difficulty breathing.  Progressive dyspnea at rest and especially with exertion for several weeks.  Positive orthopnea.  LE edema stable..  Productive cough with greenish sputum.  Declining oxygen sats, 78% on room air per EMS.   Yesterday morning he passed a large volume of burgundy blood.  Had 2 episodes yesterday and one early this morning.  No associated abdominal pain.  Not nauseated but after CPAP applied yesterday he vomited up nonbloody, small-volume yellow bilious vomit, no emesis since.  Denies history of heartburn, dysphagia, loss of appetite, early satiety. Taking diclofenac daily for a long time.  About a week ago after PO bid for worsening bilateral knee pain.  Occasional blood when he blows his nose but no true epistaxis.  No prior blood transfusion  or issues with anemia.  No knowledge of liver disease or diverticulosis.  Hgb 11.7 >> 10.5, was 60-month ago.  MCV 106.  Platelets 185, 142 a month ago.  INR 1.2.  FOBT positive Normal sodium.  Potassium 2.9.  BUN/creatinine 56/1.7, was 25/1.3 a couple of days ago.  LFTs normal.  Troponins normal.  Lactic acid elevated at 2.  COVID-19 negative. CXR with bilateral basilar atelectasis vs infiltrates.  Cardiomegaly, no edema   Patient works at [employer] as a Merchandiser, retail.  Job involves a lot of walking but not heavy lifting. History of alcohol use about 3 beers on a weekend.  A few months ago stopped drinking but had a couple of beers 2 weeks ago.  Past Medical History:  Diagnosis Date   Ascending aortic aneurysm    a. CT 05/2021 showed 4.4x4.4cm TAA.   Bilateral lower extremity edema    Chronic gout without tophus    followed by dr Sharmon Revere    (06-08-2020  per pt last episode right knee 3 wks ago)   CKD (chronic kidney disease), stage II    nephrology--- Virgina Norfolk PA (10-29-2019 note in epic scanned in  media)   Heart failure with mid-range ejection fraction (HCC) 05/27/2021   05/26/21: Left ventricular ejection  fraction, by estimation, is 45 to 50%. There is severe asymmetric left ventricular hypertrophy. G1DD present.    History of COVID-19 06/07/2021   HOCM (hypertrophic obstructive cardiomyopathy) (HCC)    s/p ICD 07/2021   Hypertension    followed by cardiology, dr t. turner   (05-14-2018 nuclear study in epic , normal perfusion with nuclear ef 61%)   Left hydrocele    Low serum potassium level 04/27/2014   OSA on CPAP    per pt uses every night   Palpitations followed by dr t. Mayford Knife   06-08-2020  still feels palipations due to PVCs when exertion but not with chest pain/ discomfort   Pneumonia due to COVID-19 virus    PVC's (premature ventricular contractions) cardiologist--- dr t. turner   Status post PVC ablation by Dr. Ladona Ridgel 2019 with recurrence of frequent  PVCs/bigeminy;  prior pseudobradycardia r/t pvcs   Rheumatoid arthritis involving multiple sites [facility])    rheumotology--- dr a. Sharmon Revere  ([facility] in [place])    Past Surgical History:  Procedure Laterality Date   BUBBLE STUDY  08/18/2021   Procedure: BUBBLE STUDY;  Surgeon: Wendall Stade, MD;  Location: [facility] ENDOSCOPY;  Service: Cardiovascular;;   HYDROCELE EXCISION Left 06/14/2020   Procedure: LEFT  HYDROCELECTOMY ADULT;  Surgeon: Noel Christmas, MD;  Location: [facility];  Service: Urology;  Laterality: Left;   ICD IMPLANT N/A 08/07/2021   Procedure: ICD IMPLANT;  Surgeon: Marinus Maw, MD;  Location: [facility] INVASIVE CV LAB;  Service: Cardiovascular;  Laterality: N/A;   INCISIONAL HERNIA REPAIR  02-23-2016   @[facility]    LAPAROSCOPIC   LAPAROSCOPIC INGUINAL HERNIA REPAIR Bilateral 08-22-2015  @[facility]    AND UMBILICAL HERNIA REPAIR   PVC ABLATION N/A 10/07/2018   Procedure: PVC ABLATION;  Surgeon: , MD;  Location: [facility] INVASIVE CV LAB;  Service: Cardiovascular;  Laterality: N/A;   RIGHT/LEFT HEART CATH AND CORONARY ANGIOGRAPHY N/A 05/29/2021   Procedure: RIGHT/LEFT HEART CATH AND CORONARY ANGIOGRAPHY;  Surgeon: Marinus Maw, MD;  Location: [facility] INVASIVE CV LAB;  Service: Cardiovascular;  Laterality: N/A;   TEE WITHOUT CARDIOVERSION N/A 08/18/2021   Procedure: TRANSESOPHAGEAL ECHOCARDIOGRAM (TEE);  Surgeon: Kathleene Hazel, MD;  Location: [facility] ENDOSCOPY;  Service: Cardiovascular;  Laterality: N/A;   UMBILICAL HERNIA REPAIR  child    Prior to Admission medications   Medication Sig Start Date End Date Taking? Authorizing Provider  Acetaminophen-Codeine 300-30 MG tablet Take 1-2 tablets by mouth every 12 (twelve) hours as needed for pain. 09/25/21  Yes [provider]  albuterol (VENTOLIN HFA) 108 (90 Base) MCG/ACT inhaler Inhale 2 puffs into the lungs every 4 (four) hours as needed for wheezing or shortness of breath. 03/02/21  Yes Betancourt, 09/27/21, NP  apixaban  (ELIQUIS) 5 MG TABS tablet Take 1 tablet (5 mg total) by mouth 2 (two) times daily. 06/17/21  Yes Parrett, Tammy S, NP  atorvastatin (LIPITOR) 40 MG tablet Take 1 tablet (40 mg total) by mouth daily. Patient taking differently: Take 40 mg by mouth at bedtime. 01/09/21  Yes Turner, 08/18/21, MD  chlorthalidone (HYGROTON) 25 MG tablet Take 25 mg by mouth at bedtime. 04/02/21  Yes [provider]  colchicine 0.6 MG tablet Take 0.6-1.2 mg by mouth daily as needed (for gout flares). 01/31/16  Yes [provider]  fluticasone (FLONASE) 50 MCG/ACT nasal spray Place 1 spray into both nostrils daily. Patient taking differently: Place 2 sprays into both nostrils at bedtime. 06/08/21  Yes  Lilland, Alana, DO  folic acid (FOLVITE) 1 MG tablet Take 1 mg by mouth daily. 01/29/20  Yes [provider]  furosemide (LASIX) 40 MG tablet Take 1 tablet (40 mg total) by mouth as needed. Take 1 tablet by mouth daily for 3 days, then decrease to only as needed for increase weight gain Patient taking differently: Take 40 mg by mouth 2 (two) times daily as needed (as directed for increased weight gain). 07/11/21  Yes Dyann Kief, PA-C  gabapentin (NEURONTIN) 600 MG tablet Take 600 mg by mouth with breakfast, with lunch, and with evening meal. 02/04/19  Yes [provider]  HUMIRA PEN 40 MG/0.4ML PNKT Inject 40 mg into the skin every 14 (fourteen) days. 04/09/21  Yes [provider]  hydrALAZINE (APRESOLINE) 100 MG tablet Take 1 tablet (100 mg total) by mouth every 8 (eight) hours. 09/12/21  Yes Graciella Freer, PA-C  losartan (COZAAR) 100 MG tablet Take 1 tablet (100 mg total) by mouth daily. 06/14/21  Yes Quintella Reichert, MD  Magnesium Oxide 400 MG CAPS Take 1 capsule (400 mg total) by mouth daily. 09/21/21  Yes Graciella Freer, PA-C  methotrexate 2.5 MG tablet Take 15 mg by mouth every Sunday. 06/22/20  Yes [provider]  montelukast (SINGULAIR) 10 MG tablet Take 1  tablet (10 mg total) by mouth at bedtime. 09/07/21  Yes Betancourt, Jarold Song, NP  potassium chloride SA (KLOR-CON) 20 MEQ tablet Take 2 tablets (40 mEq total) by mouth 2 (two) times daily. 09/12/21  Yes Graciella Freer, PA-C  predniSONE (DELTASONE) 5 MG tablet Take 5 mg by mouth daily with breakfast. 01/29/20  Yes [provider]  sotalol (BETAPACE) 120 MG tablet Take 1 tablet (120 mg total) by mouth every 12 (twelve) hours. 09/12/21  Yes Graciella Freer, PA-C  spironolactone (ALDACTONE) 25 MG tablet Take 1 tablet (25 mg total) by mouth daily. 09/13/21  Yes Graciella Freer, PA-C  aspirin EC 81 MG tablet Take 1 tablet (81 mg total) by mouth daily. Swallow whole. Patient not taking: No sig reported 08/24/21   Tonny Bollman, MD  fluticasone-salmeterol (ADVAIR DISKUS) 100-50 MCG/ACT AEPB Inhale 1 puff into the lungs 2 (two) times daily. Patient not taking: No sig reported 10/06/21 11/05/21  Betancourt, Inetta Fermo A, NP  sildenafil (REVATIO) 20 MG tablet Take 20 mg by mouth daily as needed (erectile dysfunction). 08/03/20   [provider]    Scheduled Meds:  atorvastatin  40 mg Oral QHS   gabapentin  600 mg Oral TID with meals   guaiFENesin  600 mg Oral BID   metoprolol tartrate  25 mg Oral BID   potassium chloride  40 mEq Oral STAT   predniSONE  5 mg Oral Q breakfast   sodium chloride flush  3 mL Intravenous Q12H   sotalol  120 mg Oral Q12H   Infusions:  cefTRIAXone (ROCEPHIN)  IV     PRN Meds: acetaminophen **OR** acetaminophen, acetaminophen-codeine, levalbuterol, trimethobenzamide   Allergies as of 10/07/2021 - Review Complete 10/07/2021  Allergen Reaction Noted   Amlodipine besylate Swelling and Other (See Comments) 01/10/2016   Aspirin Itching 02/22/2015    Family History  Problem Relation Age of Onset   Cardiomyopathy Mother    Heart attack Father     Social History   Socioeconomic History   Marital status: Married    Spouse name: Not on  file   Number of children: Not on file   Years of education: Not  on file   Highest education level: Not on file  Occupational History   Not on file  Tobacco Use   Smoking status: Never   Smokeless tobacco: Never  Vaping Use   Vaping Use: Never used  Substance and Sexual Activity   Alcohol use: Not Currently    Alcohol/week: 5.0 - 7.0 standard drinks    Types: 5 - 7 Cans of beer per week    Comment: Stop drinking alcohol 3 months ago   Drug use: Never   Sexual activity: Not on file  Other Topics Concern   Not on file  Social History Narrative   Not on file   Social Determinants of Health   Financial Resource Strain: Not on file  Food Insecurity: Not on file  Transportation Needs: Not on file  Physical Activity: Not on file  Stress: Not on file  Social Connections: Not on file  Intimate Partner Violence: Not on file    REVIEW OF SYSTEMS: Constitutional: Fatigue ENT: Scant blood in nasal sputum on occasion. Pulm: Productive cough as per HPI. CV:  No palpitations, no LE edema.  No angina GU:  No hematuria, no frequency.  No dysuria. GI: No heartburn, no dysphagia. Heme: Denies unusual or excessive bleeding or bruising Transfusions: None ever Neuro:  No headaches, no peripheral tingling or numbness.  No syncope, no seizures. Derm:  No itching, no rash or sores.  Endocrine:  No sweats or chills.  No polyuria or dysuria Immunization: Reviewed.   PHYSICAL EXAM: Vital signs in last 24 hours: Vitals:   10/08/21 0438 10/08/21 0800  BP: 122/73 117/75  Pulse: 64 80  Resp: 20 20  Temp: 98.4 F (36.9 C) 98.7 F (37.1 C)  SpO2: 98% 95%   Wt Readings from Last 3 Encounters:  10/08/21 103 kg  09/28/21 115.8 kg  09/19/21 116 kg    General: Overweight, comfortable, pleasant.  Does not look acutely ill.  Appears stated age. Head: No signs of head trauma.  Symmetric facies. Eyes: Conjunctiva slightly pale.  No scleral icterus.  EOMI Ears: Not hard of hearing Nose: No  discharge or congestion Mouth: Dentures in place.  Tongue midline.  Oral mucosa moist, pink, clear Neck: No JVD Lungs: A few rales in the left base.  No dyspnea.  Good breath sounds bilaterally.  No cough. Heart: RRR.  No MRG.  Heart sounds are distant. Abdomen: Soft without tenderness.  No HSM, masses, bruits, hernias.   Rectal: Deferred.  Patient showed me a picture of his first episode of hematochezia.  The commode water is completely burgundy wine colored. Musc/Skeltl: No joint redness, swelling.  Some arthritic deformity of the knees. Extremities: No edema. Neurologic: Pleasant, fully oriented.  Fluid speech.  Moves all 4 limbs without gross deficit, strength not formally tested. Skin: No telangiectasia, no rash, no sores, no suspicious lesions. Nodes: No cervical adenopathy. Psych: Calm, pleasant, cooperative.  Asks pertinent, intelligent questions.  Intake/Output from previous day: 10/29 0701 - 10/30 0700 In: 866 [P.O.:720; I.V.:63; IV Piggyback:83] Out: 2050 [Urine:2050] Intake/Output this shift: Total I/O In: 30 [P.O.:30] Out: -   LAB RESULTS: Recent Labs    10/07/21 1031 10/07/21 1253 10/07/21 2107  WBC 6.3  --   --   HGB 11.7* 11.2*  11.6* 10.5*  HCT 36.0* 33.0*  34.0* 32.0*  PLT 185  --   --    BMET Lab Results  Component Value Date   NA 136 10/08/2021   NA 141 10/07/2021   NA  140 10/07/2021   K 2.9 (L) 10/08/2021   K 3.4 (L) 10/07/2021   K 3.4 (L) 10/07/2021   CL 106 10/08/2021   CL 109 10/07/2021   CL 108 10/07/2021   CO2 19 (L) 10/08/2021   CO2 19 (L) 10/07/2021   CO2 17 (L) 10/05/2021   GLUCOSE 147 (H) 10/08/2021   GLUCOSE 145 (H) 10/07/2021   GLUCOSE 152 (H) 10/07/2021   BUN 56 (H) 10/08/2021   BUN 43 (H) 10/07/2021   BUN 48 (H) 10/07/2021   CREATININE 1.72 (H) 10/08/2021   CREATININE 1.10 10/07/2021   CREATININE 1.13 10/07/2021   CALCIUM 8.6 (L) 10/08/2021   CALCIUM 9.0 10/07/2021   CALCIUM 8.9 10/05/2021   LFT Recent Labs     10/07/21 1031  PROT 5.9*  ALBUMIN 3.6  AST 28  ALT 26  ALKPHOS 51  BILITOT 1.1   PT/INR Lab Results  Component Value Date   INR 1.2 10/07/2021   INR 1.1 05/25/2021   Hepatitis Panel No results for input(s): HEPBSAG, HCVAB, HEPAIGM, HEPBIGM in the last 72 hours. C-Diff No components found for: CDIFF Lipase     Component Value Date/Time   LIPASE 41 06/06/2021 1703    Drugs of Abuse  No results found for: LABOPIA, COCAINSCRNUR, LABBENZ, AMPHETMU, THCU, LABBARB   RADIOLOGY STUDIES: DG Chest Port 1 View  Result Date: 10/07/2021 CLINICAL DATA:  Shortness of breath EXAM: PORTABLE CHEST 1 VIEW COMPARISON:  09/08/2021 FINDINGS: Chronic cardiopericardial enlargement. Aortic tortuosity. Low volume chest with indistinct density at the bases. Dual-chamber pacer leads from the left in unremarkable position. Artifact from EKG leads. IMPRESSION: Low volume chest with atelectasis or infiltrate at the bases. Chronic cardiomegaly.  No edema. Electronically Signed   By: Tiburcio Pea M.D.   On: 10/07/2021 11:20     IMPRESSION:   Painless hematochezia.   History colon polyps.  Overdue for surveillance colonoscopy but this is set up for May 2023.  No previous endoscopy.  ROS negative for upper GI pathology.  However he has been taking diclofenac with recent acceleration of dosing.  No previous EGD.     Chronic Eliquis.  Last dose was 10/28 at 6 PM.  Cardiomyopathy.  HOCM.   OSA on CPAP.  Progressive respiratory insufficiency, acute hypoxia.    PLAN:     EGD planned for today.  Suspect will require colonoscopy (for which he is overdue for polyp surveillance) before discharge.  Procedure discussed with patient and he is agreeable to proceed.   Jennye Moccasin  10/08/2021, 8:17 AM Phone [phone]

## 2021-10-09 ENCOUNTER — Inpatient Hospital Stay (HOSPITAL_COMMUNITY): Payer: No Typology Code available for payment source

## 2021-10-09 ENCOUNTER — Encounter (HOSPITAL_COMMUNITY): Payer: Self-pay | Admitting: Gastroenterology

## 2021-10-09 DIAGNOSIS — K922 Gastrointestinal hemorrhage, unspecified: Secondary | ICD-10-CM | POA: Diagnosis not present

## 2021-10-09 DIAGNOSIS — I480 Paroxysmal atrial fibrillation: Secondary | ICD-10-CM | POA: Diagnosis not present

## 2021-10-09 DIAGNOSIS — R0600 Dyspnea, unspecified: Secondary | ICD-10-CM | POA: Diagnosis not present

## 2021-10-09 DIAGNOSIS — I421 Obstructive hypertrophic cardiomyopathy: Secondary | ICD-10-CM | POA: Diagnosis not present

## 2021-10-09 DIAGNOSIS — E876 Hypokalemia: Secondary | ICD-10-CM

## 2021-10-09 DIAGNOSIS — J9601 Acute respiratory failure with hypoxia: Secondary | ICD-10-CM | POA: Diagnosis not present

## 2021-10-09 DIAGNOSIS — D62 Acute posthemorrhagic anemia: Secondary | ICD-10-CM | POA: Diagnosis not present

## 2021-10-09 LAB — BASIC METABOLIC PANEL
Anion gap: 9 (ref 5–15)
BUN: 42 mg/dL — ABNORMAL HIGH (ref 8–23)
CO2: 21 mmol/L — ABNORMAL LOW (ref 22–32)
Calcium: 8.4 mg/dL — ABNORMAL LOW (ref 8.9–10.3)
Chloride: 107 mmol/L (ref 98–111)
Creatinine, Ser: 1.36 mg/dL — ABNORMAL HIGH (ref 0.61–1.24)
GFR, Estimated: 58 mL/min — ABNORMAL LOW (ref >60)
Glucose, Bld: 105 mg/dL — ABNORMAL HIGH (ref 70–99)
Potassium: 3.2 mmol/L — ABNORMAL LOW (ref 3.5–5.1)
Sodium: 137 mmol/L (ref 135–145)

## 2021-10-09 LAB — CBC
HCT: 30.2 % — ABNORMAL LOW (ref 39.0–52.0)
Hemoglobin: 9.8 g/dL — ABNORMAL LOW (ref 13.0–17.0)
MCH: 33.4 pg (ref 26.0–34.0)
MCHC: 32.5 g/dL (ref 30.0–36.0)
MCV: 103.1 fL — ABNORMAL HIGH (ref 80.0–100.0)
Platelets: 139 10*3/uL — ABNORMAL LOW (ref 150–400)
RBC: 2.93 MIL/uL — ABNORMAL LOW (ref 4.22–5.81)
RDW: 14.6 % (ref 11.5–15.5)
WBC: 5.5 10*3/uL (ref 4.0–10.5)
nRBC: 0 % (ref 0.0–0.2)

## 2021-10-09 LAB — H PYLORI, IGM, IGG, IGA AB
H Pylori IgG: 0.29 Index Value (ref 0.00–0.79)
H. Pylogi, Iga Abs: <9 units (ref 0.0–8.9)
H. Pylogi, Igm Abs: <9 units (ref 0.0–8.9)

## 2021-10-09 LAB — APTT: aPTT: 32 seconds (ref 24–36)

## 2021-10-09 IMAGING — DX DG CHEST 1V PORT
1 series · 1 of 1 positions shown · non-contrast
Comparison: Previous studies including the examination of
[DATE]

CLINICAL DATA: Pneumonia

EXAM:
PORTABLE CHEST 1 VIEW

[chest ap]
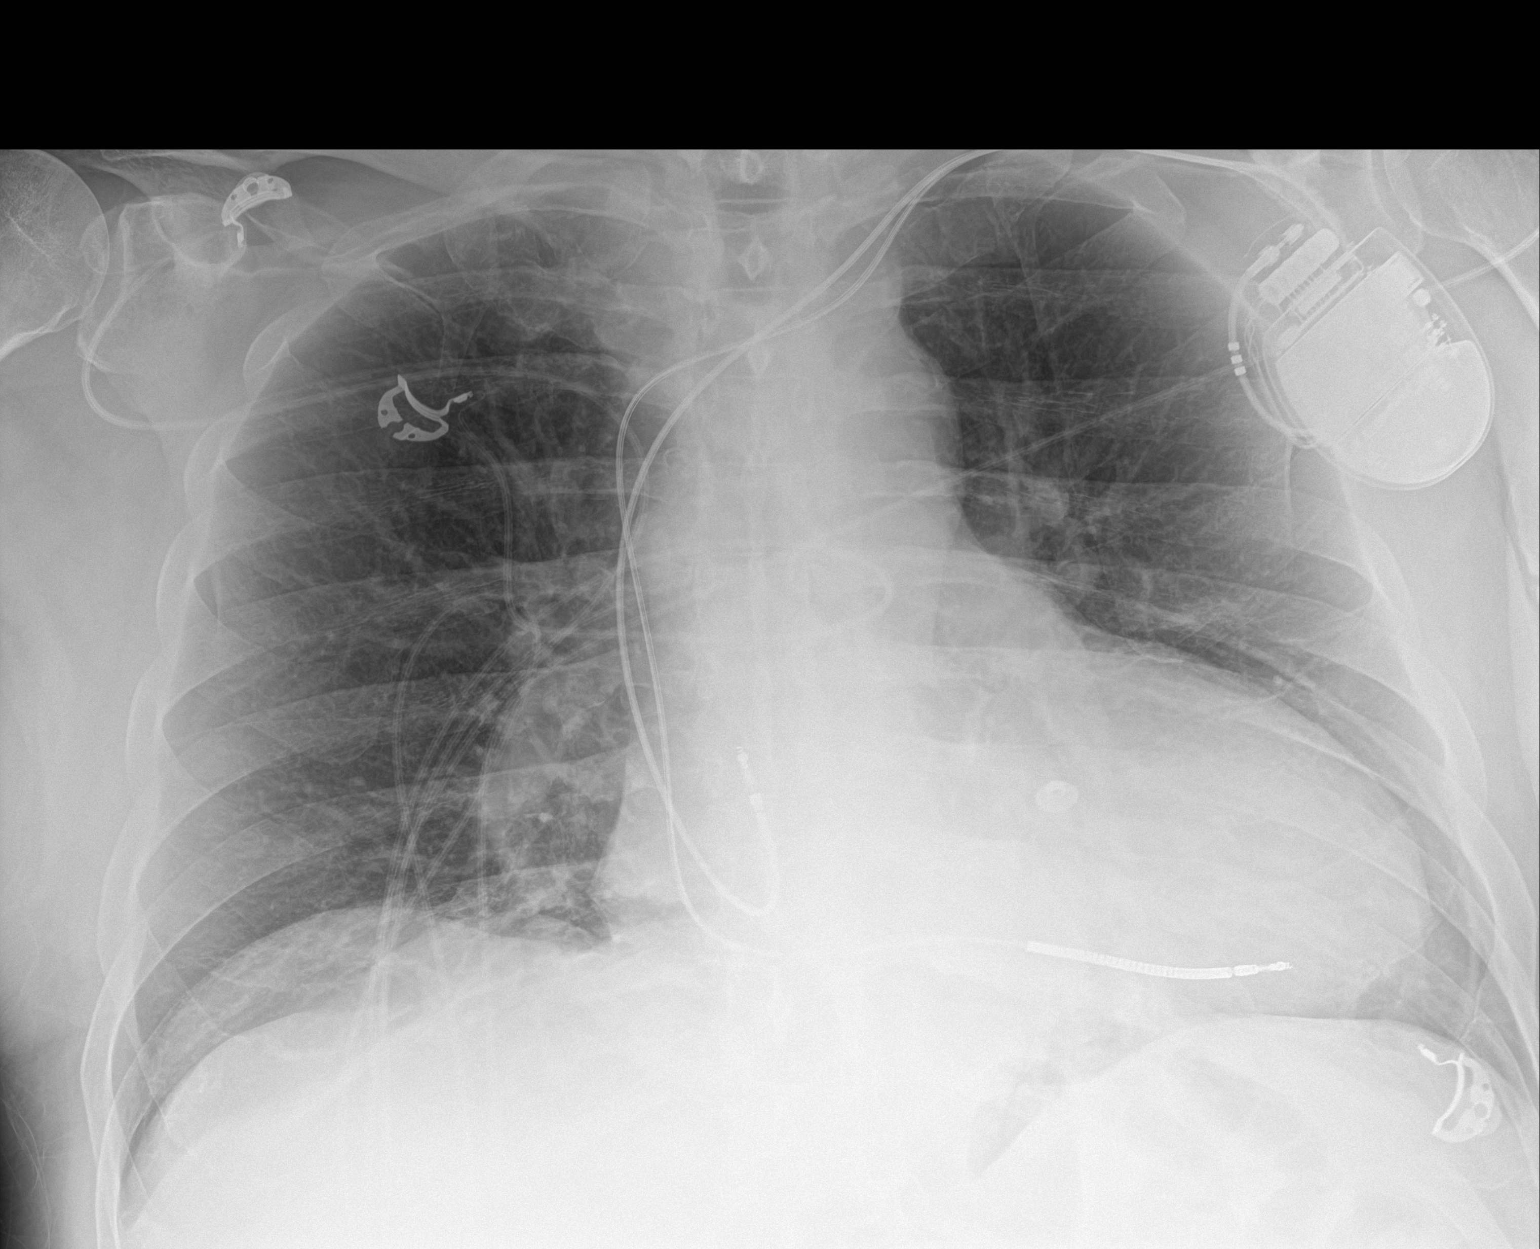

[1 of 1 positions shown; findings below may reference images not displayed]

FINDINGS: Transverse diameter of heart is increased. Pacemaker/defibrillator
battery is seen in the left infraclavicular region. There are no
signs of pulmonary edema. There is interval decrease in infiltrates
in both lower lung fields suggesting resolving
pneumonia/atelectasis. There is no significant pleural effusion or
pneumothorax.
IMPRESSION: There is interval improvement in aeration of both lower lung fields
suggesting resolving atelectasis/pneumonitis. There are no new
infiltrates or signs of pulmonary edema.

## 2021-10-09 MED ORDER — ALUM & MAG HYDROXIDE-SIMETH 200-200-20 MG/5ML PO SUSP
30.0000 mL | ORAL | Status: DC | PRN
  Administered 2021-10-09: 30 mL via ORAL
  Filled 2021-10-09: qty 30

## 2021-10-09 MED ORDER — METOPROLOL TARTRATE 25 MG PO TABS
25.0000 mg | ORAL_TABLET | Freq: Once | ORAL | Status: AC
  Administered 2021-10-09: 25 mg via ORAL
  Filled 2021-10-09: qty 1

## 2021-10-09 MED ORDER — POTASSIUM CHLORIDE CRYS ER 20 MEQ PO TBCR
40.0000 meq | EXTENDED_RELEASE_TABLET | Freq: Once | ORAL | Status: AC
  Administered 2021-10-09: 40 meq via ORAL
  Filled 2021-10-09: qty 2

## 2021-10-09 MED ORDER — APIXABAN 5 MG PO TABS
5.0000 mg | ORAL_TABLET | Freq: Two times a day (BID) | ORAL | Status: DC
  Administered 2021-10-10 – 2021-10-12 (×5): 5 mg via ORAL
  Filled 2021-10-09 (×6): qty 1

## 2021-10-09 MED ORDER — METOPROLOL TARTRATE 50 MG PO TABS
50.0000 mg | ORAL_TABLET | Freq: Two times a day (BID) | ORAL | Status: DC
  Administered 2021-10-09 – 2021-10-12 (×6): 50 mg via ORAL
  Filled 2021-10-09 (×6): qty 1

## 2021-10-09 NOTE — Progress Notes (Signed)
PT unsure is he wants to use his cpap tonight. RT instructed him to call if he decided he wanted to go on it.

## 2021-10-09 NOTE — Progress Notes (Addendum)
[facility] PROGRESS NOTE   Ryan Glass [id number] DOB: [birth year] DOA: 10/07/2021  PCP: [name], MD  Brief History/Interval Summary: 65 y.o. male with medical history significant of combined systolic and diastolic CHF, HOCM, VT s/p ICD, CAD, PAF, HTN, HLD, COPD, PE in 05/2021 on LE, OSA on CPAP who presented with complaints of shortness of breath.  Symptoms are suggestive of orthopnea and PND.  Denies having any ICD shocks to his knowledge.  Also had a productive cough with greenish sputum.  Not on oxygen at home.  Also mentioned blood in stool recently.  Upon EMS arrival patient was noted to have O2 saturations of 78% on room air.  Chest x-ray showed concern for atelectasis versus infiltrate at the bases.  Patient was given antibiotics.  Was hospitalized for further management.  Initially placed on IV heparin but then developed GI bleed so this was discontinued and reversed with protamine.  Also noted to be in atrial fibrillation with RVR.  Discussions were held with the cardiology and patient was given amiodarone.  Reason for Visit: GI bleeding.  Atrial fibrillation with RVR.  Consultants: Cardiology.  Gastroenterology  Procedures:    EGD 10/30 Impression:               - The examined portions of the nasopharynx,                            oropharynx and larynx were normal.                           - Normal esophagus.                           - Non-bleeding gastric ulcer with no stigmata of                            bleeding. Biopsied. Suspect this is related to                            diclofenac use                           - Gastritis with hemorrhage. Biopsied.                           - Normal examined duodenum.   Subjective/Interval History: Patient mentioned that he had a bowel movement at 11:00 last night but he flushed the toilet before nursing staff to look at it.  He thinks he saw small amount of blood in the toilet bowl but is not sure.  Denies  any chest pain.  Continues to have some shortness of breath.  Occasional cough.  No nausea or vomiting.     Assessment/Plan:  Acute respiratory failure with hypoxia/community-acquired pneumonia Chest x-ray suggested infiltrates bilaterally.  No evidence for aspiration.  Patient required BiPAP briefly.  Has been stable on nasal cannula at 4 to 5 L over the last 24 hours.  Saturations noted to be in the mid to late 90s.  Discussed with nursing staff to try and wean down the oxygen further. WBC remains normal.  Procalcitonin was 0.93.  He remains on ceftriaxone. Low suspicion for PE since patient had  been compliant with Eliquis. Chest x-ray was done this morning which shows improvement in the lung findings.  No pulmonary edema noted.  Upper GI bleed secondary to gastritis/gastric ulcer Patient seen by gastroenterology and underwent EGD on 10/30.  Showed evidence for gastric ulcer along with gastritis with hemorrhage.  Thought to be secondary to NSAIDs.  Hemoglobin stable.  Continue to monitor.  Continue PPI.  Hemoglobin did drop but did not require any blood transfusion.  Stable over the last 24 hours.  Discontinue all NSAIDs.  Acute blood loss anemia Drop in hemoglobin noted.  Has not required transfusion yet.  We will continue to monitor closely.  Acute kidney injury Creatinine was noted to be 1.72 yesterday.  Baseline is normal.  Likely due to hypovolemia.  Started on IV fluids with improvement.  Continue for another 24 hours.  Avoid nephrotoxic agents.  Monitor urine output.    Paroxysmal atrial fibrillation with RVR Noted to be in RVR at the time of admission.  Cardiology was consulted.  Sotalol was continued.  Metoprolol was added by cardiology.  Heart rate noted to be poorly controlled since last night and this morning.  Cardiology to see today.  Replace potassium.  History of ventricular tachycardia Recently required admission in August for VT/new onset A. fib.  Underwent ICD placement.   Continue to monitor on telemetry.  History of pulmonary embolism This was in June 2022.  Looks like a small PE was detected.  No DVT was noted.  Anticoagulation held due to GI bleed.  Gastroenterology has cleared for him to resume Eliquis today.  We will start from tonight.    Chronic diastolic CHF/HOCM Fairly euvolemic currently.  Noted to be on chlorthalidone spironolactone at home.  Will resume once renal function has stabilized.  Prolonged QT interval Monitor on telemetry.  History of PFO Per cardiology  Essential hypertension Blood pressure is reasonably well controlled.  Hypokalemia Will be repleted.  Check magnesium  Hyperlipidemia Continue statin.  Obstructive sleep apnea CPAP  Obesity Estimated body mass index is 34.8 kg/m as calculated from the following:   Height as of this encounter: 5\' 10"  (1.778 m).   Weight as of this encounter: 110 kg.   DVT Prophylaxis: We will resume Eliquis tonight.  SCDs. Code Status: Full code Family Communication: Discussed with patient Disposition Plan: Hopefully return home when improved  Status is: Inpatient  Remains inpatient appropriate because: GI bleeding, atrial fibrillation, acute respiratory failure with hypoxia      Medications: Scheduled:  atorvastatin  40 mg Oral QHS   gabapentin  600 mg Oral TID with meals   guaiFENesin  600 mg Oral BID   metoprolol tartrate  25 mg Oral Once   metoprolol tartrate  50 mg Oral BID   pantoprazole  40 mg Oral BID   predniSONE  5 mg Oral Q breakfast   sodium chloride flush  3 mL Intravenous Q12H   sotalol  120 mg Oral Q12H   sucralfate  1 g Oral TID WC & HS   Continuous:  sodium chloride 75 mL/hr at 10/09/21 0847   cefTRIAXone (ROCEPHIN)  IV 1 g (10/08/21 1638)   [id number] **OR** acetaminophen, acetaminophen-codeine, alum & mag hydroxide-simeth, levalbuterol, trimethobenzamide  Antibiotics: Anti-infectives (From admission, onward)    Start     Dose/Rate Route  Frequency Ordered Stop   10/08/21 1530  cefTRIAXone (ROCEPHIN) 1 g in sodium chloride 0.9 % 100 mL IVPB        1 g 200 mL/hr over  30 Minutes Intravenous Every 24 hours 10/07/21 1842     10/07/21 1345  cefTRIAXone (ROCEPHIN) 1 g in sodium chloride 0.9 % 100 mL IVPB        1 g 200 mL/hr over 30 Minutes Intravenous  Once 10/07/21 1341 10/07/21 1628   10/07/21 1345  azithromycin (ZITHROMAX) 500 mg in sodium chloride 0.9 % 250 mL IVPB  Status:  Discontinued        500 mg 250 mL/hr over 60 Minutes Intravenous  Once 10/07/21 1341 10/07/21 1424       Objective:  Vital Signs  Vitals:   10/09/21 0439 10/09/21 0800 10/09/21 0802 10/09/21 0805  BP:   105/79 112/82  Pulse:  (!) 101 (!) 108 66  Resp:    16  Temp:      TempSrc:      SpO2:    97%  Weight: 110 kg     Height:        Intake/Output Summary (Last 24 hours) at 10/09/2021 0950 Last data filed at 10/09/2021 0133 Gross per 24 hour  Intake 100 ml  Output 500 ml  Net -400 ml    Filed Weights   10/07/21 2101 10/08/21 0500 10/09/21 0439  Weight: 102.9 kg 103 kg 110 kg    General appearance: Awake alert.  In no distress Resp: Normal effort at rest.  Few crackles bilateral bases.  No wheezing or rhonchi. Cardio: S1-S2 is irregularly irregular, tachycardic GI: Abdomen is soft.  Nontender nondistended.  Bowel sounds are present normal.  No masses organomegaly Extremities: No edema.  Full range of motion of lower extremities. Neurologic: Alert and oriented x3.  No focal neurological deficits.     Lab Results:  Data Reviewed: I have personally reviewed following labs and imaging studies  CBC: Recent Labs  Lab 10/07/21 1031 10/07/21 1253 10/07/21 2107 10/08/21 0907 10/08/21 1337 10/09/21 0216  WBC 6.3  --   --  8.8 7.2 5.5  NEUTROABS 4.9  --   --   --   --   --   HGB 11.7* 11.2*  11.6* 10.5* 9.7* 9.6* 9.8*  HCT 36.0* 33.0*  34.0* 32.0* 28.8* 28.6* 30.2*  MCV 106.2*  --   --  100.7* 102.1* 103.1*  PLT 185  --   --   142* 144* 139*     Basic Metabolic Panel: Recent Labs  Lab 10/05/21 1130 10/07/21 1031 10/07/21 1253 10/08/21 0318 10/09/21 0216  NA 141 138 140  141 136 137  K 4.4 3.9 3.4*  3.4* 2.9* 3.2*  CL 108* 108 109 106 107  CO2 17* 19*  --  19* 21*  GLUCOSE 138* 152* 145* 147* 105*  BUN 25 48* 43* 56* 42*  CREATININE 1.30* 1.13 1.10 1.72* 1.36*  CALCIUM 8.9 9.0  --  8.6* 8.4*     GFR: Estimated Creatinine Clearance: 67.2 mL/min (A) (by C-G formula based on SCr of 1.36 mg/dL (H)).  Liver Function Tests: Recent Labs  Lab 10/07/21 1031  AST 28  ALT 26  ALKPHOS 51  BILITOT 1.1  PROT 5.9*  ALBUMIN 3.6      Coagulation Profile: Recent Labs  Lab 10/07/21 2107  INR 1.2      BNP (last 3 results) Recent Labs    02/03/21 1515 02/06/21 1028 09/19/21 0938  PROBNP 141 47 355      Recent Results (from the past 240 hour(s))  Culture, blood (routine x 2)     Status: None (Preliminary result)  Collection Time: 10/07/21 10:30 AM   Specimen: BLOOD  Result Value Ref Range Status   Specimen Description BLOOD RIGHT ANTECUBITAL  Final   Special Requests   Final    BOTTLES DRAWN AEROBIC AND ANAEROBIC Blood Culture adequate volume   Culture   Final    NO GROWTH 2 DAYS Performed at [facility], [place] [address] [place], [place] [address]    Report Status PENDING  Incomplete  Resp Panel by RT-PCR (Flu A&B, Covid) Nasopharyngeal Swab     Status: None   Collection Time: 10/07/21 10:32 AM   Specimen: Nasopharyngeal Swab; Nasopharyngeal(NP) swabs in vial transport medium  Result Value Ref Range Status   SARS Coronavirus 2 by RT PCR NEGATIVE NEGATIVE Final    Comment: (NOTE) SARS-CoV-2 target nucleic acids are NOT DETECTED.  The SARS-CoV-2 RNA is generally detectable in upper respiratory specimens during the acute phase of infection. The lowest concentration of SARS-CoV-2 viral copies this assay can detect is 138 copies/mL. A negative result does not preclude  SARS-Cov-2 infection and should not be used as the sole basis for treatment or other patient management decisions. A negative result may occur with  improper specimen collection/handling, submission of specimen other than nasopharyngeal swab, presence of viral mutation(s) within the areas targeted by this assay, and inadequate number of viral copies(<138 copies/mL). A negative result must be combined with clinical observations, patient history, and epidemiological information. The expected result is Negative.  Fact Sheet for Patients:  EntrepreneurPulse.com.au  Fact Sheet for Healthcare Providers:  IncredibleEmployment.be  This test is no t yet approved or cleared by the [place] FDA and  has been authorized for detection and/or diagnosis of SARS-CoV-2 by FDA under an Emergency Use Authorization (EUA). This EUA will remain  in effect (meaning this test can be used) for the duration of the COVID-19 declaration under Section 564(b)(1) of the Act, 21 U.S.C.section 360bbb-3(b)(1), unless the authorization is terminated  or revoked sooner.       Influenza A by PCR NEGATIVE NEGATIVE Final   Influenza B by PCR NEGATIVE NEGATIVE Final    Comment: (NOTE) The Xpert Xpress SARS-CoV-2/FLU/RSV plus assay is intended as an aid in the diagnosis of influenza from Nasopharyngeal swab specimens and should not be used as a sole basis for treatment. Nasal washings and aspirates are unacceptable for Xpert Xpress SARS-CoV-2/FLU/RSV testing.  Fact Sheet for Patients: EntrepreneurPulse.com.au  Fact Sheet for Healthcare Providers: IncredibleEmployment.be  This test is not yet approved or cleared by the [place] FDA and has been authorized for detection and/or diagnosis of SARS-CoV-2 by FDA under an Emergency Use Authorization (EUA). This EUA will remain in effect (meaning this test can be used) for the duration of  the COVID-19 declaration under Section 564(b)(1) of the Act, 21 U.S.C. section 360bbb-3(b)(1), unless the authorization is terminated or revoked.  Performed at [facility], [place] [address] [place], [place] [address]   Culture, blood (routine x 2)     Status: None (Preliminary result)   Collection Time: 10/07/21  9:07 PM   Specimen: BLOOD RIGHT HAND  Result Value Ref Range Status   Specimen Description BLOOD RIGHT HAND  Final   Special Requests AEROBIC BOTTLE ONLY Blood Culture adequate volume  Final   Culture   Final    NO GROWTH 2 DAYS Performed at [facility], [place] [address] [place], [place] [address]    Report Status PENDING  Incomplete       Radiology  Studies: DG Chest Port 1 View  Result Date: 10/09/2021 CLINICAL DATA:  Pneumonia EXAM: PORTABLE CHEST 1 VIEW COMPARISON:  Previous studies including the examination of 10/07/2021 FINDINGS: Transverse diameter of heart is increased. Pacemaker/defibrillator battery is seen in the left infraclavicular region. There are no signs of pulmonary edema. There is interval decrease in infiltrates in both lower lung fields suggesting resolving pneumonia/atelectasis. There is no significant pleural effusion or pneumothorax. IMPRESSION: There is interval improvement in aeration of both lower lung fields suggesting resolving atelectasis/pneumonitis. There are no new infiltrates or signs of pulmonary edema. Electronically Signed   By: Ernie Avena M.D.   On: 10/09/2021 08:57   DG Chest Port 1 View  Result Date: 10/07/2021 CLINICAL DATA:  Shortness of breath EXAM: PORTABLE CHEST 1 VIEW COMPARISON:  09/08/2021 FINDINGS: Chronic cardiopericardial enlargement. Aortic tortuosity. Low volume chest with indistinct density at the bases. Dual-chamber pacer leads from the left in unremarkable position. Artifact from EKG leads. IMPRESSION: Low volume chest with atelectasis or infiltrate at the bases. Chronic cardiomegaly.  No edema.  Electronically Signed   By: Tiburcio Pea M.D.   On: 10/07/2021 11:20       LOS: 2 days   Marrianne Sica Rito Ehrlich  [facility] Pager on [email or web address]  10/09/2021, 9:50 AM

## 2021-10-09 NOTE — Progress Notes (Signed)
Brief GI Progress Note: Hb has remained stable. Gastric ulcer was suspected to be the source of the patient's bleed. GI will sign off for now. Please call if any new questions arise.

## 2021-10-09 NOTE — Telephone Encounter (Signed)
Patient asked to be discharged today and care team recommended continue inpatient due to oxygen use/anemia/continued hematochezia.  H/H stable today.  Plan to restart eliquis and monitor patient.  Still replacing electrolytes.

## 2021-10-09 NOTE — Progress Notes (Signed)
Progress Note  Patient Name: Ryan Glass Date of Encounter: 10/09/2021  [facility] Cardiologist: [name], MD   Subjective   EGD performed 10/30 noted cratered gastric ulcer and moderate gastric inflammation. Ulcer thought to be source of bleeding.  He had a bowel movement just before I saw him this AM. There are small clots and some melena. He showed me a picture from yesterday, which was basically maroon, so this is an improvement.  He is asking to go home today. I discussed that we need to improve his heart rate, restart anticoagulation and make sure he tolerates, and determine if he needs O2 going home. Also communicated with primary team.  Inpatient Medications    Scheduled Meds:  apixaban  5 mg Oral BID   atorvastatin  40 mg Oral QHS   gabapentin  600 mg Oral TID with meals   guaiFENesin  600 mg Oral BID   metoprolol tartrate  50 mg Oral BID   pantoprazole  40 mg Oral BID   predniSONE  5 mg Oral Q breakfast   sodium chloride flush  3 mL Intravenous Q12H   sotalol  120 mg Oral Q12H   sucralfate  1 g Oral TID WC & HS   Continuous Infusions:  sodium chloride 50 mL/hr at 10/09/21 1000   cefTRIAXone (ROCEPHIN)  IV 1 g (10/08/21 1638)   PRN Meds: acetaminophen **OR** acetaminophen, acetaminophen-codeine, alum & mag hydroxide-simeth, levalbuterol, trimethobenzamide   Vital Signs    Vitals:   10/09/21 0800 10/09/21 0802 10/09/21 0805 10/09/21 1021  BP:  105/79 112/82 99/67  Pulse: (!) 101 (!) 108 66 90  Resp:   16   Temp:      TempSrc:      SpO2:   97%   Weight:      Height:        Intake/Output Summary (Last 24 hours) at 10/09/2021 1025 Last data filed at 10/09/2021 0133 Gross per 24 hour  Intake 100 ml  Output 500 ml  Net -400 ml   Last 3 Weights 10/09/2021 10/08/2021 10/07/2021  Weight (lbs) 242 lb 8.1 oz 227 lb 1.2 oz 226 lb 13.7 oz  Weight (kg) 110 kg 103 kg 102.9 kg      Telemetry    Intermittent sinus rhythm and afib, rates 80s-110s  - Personally Reviewed  ECG    No new since 10/07/21 - Personally Reviewed  Physical Exam   GEN: No acute distress.   Neck: No JVD Cardiac: tachycardic, irregularly irregular, no murmurs, rubs, or gallops.  Respiratory: Clear to auscultation bilaterally. GI: Soft, nontender, non-distended  MS: No edema; No deformity. Neuro:  Nonfocal  Psych: Normal affect   Labs    High Sensitivity Troponin:   Recent Labs  Lab 10/07/21 1031 10/07/21 1231  TROPONINIHS 12 12     Chemistry Recent Labs  Lab 10/07/21 1031 10/07/21 1253 10/08/21 0318 10/09/21 0216  NA 138 140  141 136 137  K 3.9 3.4*  3.4* 2.9* 3.2*  CL 108 109 106 107  CO2 19*  --  19* 21*  GLUCOSE 152* 145* 147* 105*  BUN 48* 43* 56* 42*  CREATININE 1.13 1.10 1.72* 1.36*  CALCIUM 9.0  --  8.6* 8.4*  PROT 5.9*  --   --   --   ALBUMIN 3.6  --   --   --   AST 28  --   --   --   ALT 26  --   --   --  ALKPHOS 51  --   --   --   BILITOT 1.1  --   --   --   GFRNONAA >60  --  44* 58*  ANIONGAP 11  --  11 9    Lipids No results for input(s): CHOL, TRIG, HDL, LABVLDL, LDLCALC, CHOLHDL in the last 168 hours.  Hematology Recent Labs  Lab 10/08/21 0907 10/08/21 1337 10/09/21 0216  WBC 8.8 7.2 5.5  RBC 2.86* 2.80* 2.93*  HGB 9.7* 9.6* 9.8*  HCT 28.8* 28.6* 30.2*  MCV 100.7* 102.1* 103.1*  MCH 33.9 34.3* 33.4  MCHC 33.7 33.6 32.5  RDW 14.3 14.4 14.6  PLT 142* 144* 139*   Thyroid No results for input(s): TSH, FREET4 in the last 168 hours.  BNP Recent Labs  Lab 10/07/21 1031  BNP 33.9    DDimer No results for input(s): DDIMER in the last 168 hours.   Radiology    DG Chest Port 1 View  Result Date: 10/09/2021 CLINICAL DATA:  Pneumonia EXAM: PORTABLE CHEST 1 VIEW COMPARISON:  Previous studies including the examination of 10/07/2021 FINDINGS: Transverse diameter of heart is increased. Pacemaker/defibrillator battery is seen in the left infraclavicular region. There are no signs of pulmonary edema. There is  interval decrease in infiltrates in both lower lung fields suggesting resolving pneumonia/atelectasis. There is no significant pleural effusion or pneumothorax. IMPRESSION: There is interval improvement in aeration of both lower lung fields suggesting resolving atelectasis/pneumonitis. There are no new infiltrates or signs of pulmonary edema. Electronically Signed   By: Ernie Avena M.D.   On: 10/09/2021 08:57   DG Chest Port 1 View  Result Date: 10/07/2021 CLINICAL DATA:  Shortness of breath EXAM: PORTABLE CHEST 1 VIEW COMPARISON:  09/08/2021 FINDINGS: Chronic cardiopericardial enlargement. Aortic tortuosity. Low volume chest with indistinct density at the bases. Dual-chamber pacer leads from the left in unremarkable position. Artifact from EKG leads. IMPRESSION: Low volume chest with atelectasis or infiltrate at the bases. Chronic cardiomegaly.  No edema. Electronically Signed   By: Tiburcio Pea M.D.   On: 10/07/2021 11:20    Cardiac Studies   TEE 08/2021: 1. Will have Dr Excell Seltzer review regarding closure of PFO in regard to  patients hypoxemai.   2. Left ventricular ejection fraction, by estimation, is 55 to 60%. The  left ventricle has normal function. There is mild left ventricular  hypertrophy. Left ventricular diastolic function could not be evaluated.   3. AICD wires in RV/RA. Right ventricular systolic function is moderately  reduced. The right ventricular size is moderately enlarged.   4. R/L upper pulmonary veins and LLPV normal Unable to visualize RLPV.  Left atrial size was mildly dilated. No left atrial/left atrial appendage  thrombus was detected.   5. Evidence of atrial level shunting detected by color flow Doppler.  Agitated saline contrast bubble study was positive with shunting observed  within 3-6 cardiac cycles suggestive of interatrial shunt.   6. Right atrial size was mildly dilated.   7. A small pericardial effusion is present. The pericardial effusion is   anterior to the right ventricle.   8. The mitral valve is grossly normal. Mild mitral valve regurgitation.   9. The aortic valve is tricuspid. Aortic valve regurgitation is mild.  10. Aortic dilatation noted. 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 There is mild dilatation of the ascending aorta,  measuring 41 mm.   Conclusion(s)/Recommendation(s): Unable to perform while anesthetized in  endoscopy but consider r/o component  of platypnea orthodeoxia with bubble study in standing and supine  positions in office.    Echocardiogram 07/2021: 1. Left ventricular ejection fraction, by estimation, is 60 to 65%. The  left ventricle has normal function. The left ventricle has no regional  wall motion abnormalities. The left ventricular internal cavity size was  mildly dilated. There is mild left  ventricular hypertrophy. Left ventricular diastolic parameters are  consistent with Grade I diastolic dysfunction (impaired relaxation).   2. Right ventricular systolic function is mildly reduced. The right  ventricular size is mildly enlarged.   3. Left atrial size was mildly dilated.   4. The mitral valve is normal in structure. No evidence of mitral valve  regurgitation. No evidence of mitral stenosis.   5. The aortic valve is tricuspid. Aortic valve regurgitation is not  visualized. Mild aortic valve sclerosis is present, with no evidence of  aortic valve stenosis.   6. Aortic dilatation noted. There is moderate dilatation of the aortic  root, measuring 46 mm. There is mild dilatation of the ascending aorta,  measuring 40 mm.   7. Agitated saline contrast bubble study was positive with shunting  observed within 3-6 cardiac cycles suggestive of interatrial shunt.   R/LHC 05/2021: Prox RCA lesion is 20% stenosed. Prox Cx to Mid Cx lesion is 20% stenosed. Mid LAD lesion is 30% stenosed.   Mild non-obstructive CAD Normal right and left heart pressures    Recommendations: Medical management of mild CAD. Will resume IV heparin 6 hours post sheath pull given acute PE. Would transition to Eliquis or Xarelto tomorrow.   Patient Profile     65 y.o. male with PMH chronic systolic and diastolic heart failure, HOCM, VT s/p ICD, nonobstructive CAD, PVCs s/p ablation, paroxysmal atrial fibrillation, PFO, hypertension, hyperlipidemia, history of PE 05/2021, OSA on CPAP, rheumatoid arthritis who presented with shortness of breath and found to have GI bleed.  Assessment & Plan    Shortness of breath Presentation with Acute hypoxic respiratory failure OSA on CPAP (though reports being unable to tolerate) Chronic systolic and diastolic heart failure with recovered EF PFO -appears euvolemic on exam -O2 down to 2L by nasal cannula. CXR this AM clear. Need to determine if he needs home O2. Would walk and evaluate later today.  Paroxysmal atrial fibrillation History of recent pulmonary embolism Gastric ulcer, GI bleeding Nonobstructive CAD -chadsvasc=6, recent PE. Both require anticoagulation. Was on apixaban 5 mg BID prior to admission -EGD with gastric ulcer and gastritis. Restart oral anticoagulation today per GI note. However, Hgb down today, had small clots/melena in stool. Will discuss timing with primary team -given ulcer, reasonable to hold aspirin until he completes ulcer treatment, as his CAD is nonobstructive -I personally reviewed his last device check. He was a-paced >90% of the time, with just scattered afib. Suspect his elevated heart rates are due to his anemia (prior hemoglobin 14, nadir 9.6 this admission) -given that sotalol is reverse use dependent, suspect his faster heart rate are making this less effective. Will increase metoprolol slightly today to 50 mg BID, cautious given borderline blood pressures  HOCM History of VT s/p ICD Frequent PVCs s/p ablation, with recurrence of PVCs -continue sotalol and metoprolol as  above  Hypertension -home regimen is hydralazine 100 mg TID, spironolactone 25 mg daily, furosemide 40 mg daily, losartan 100 mg daily, chlorthalidone 25 mg daily -allergy to amlodipine -with acute kidney injury, spironolactone, losartan on hold -would not use both furosemide and chlorthalidone.   Hyperlipidemia -last  lipids 6.2022 reviewed, LDL 61 -continue atorvastatin 40 mg daily  Acute kidney injury -Cr 1.36 today, baseline appears to be 1.1-1.2, peak 1.72 this admission  Hypokalemia: -K 3.2, aim for >4 given arrhythmia history. Supplemented today -takes spironolactone, losartan, K supplement at home   For questions or updates, please contact [place] Please consult [email or web address] for contact info under        Signed, Buford Dresser, MD  10/09/2021, 10:25 AM

## 2021-10-10 DIAGNOSIS — J9601 Acute respiratory failure with hypoxia: Secondary | ICD-10-CM | POA: Diagnosis not present

## 2021-10-10 DIAGNOSIS — I480 Paroxysmal atrial fibrillation: Secondary | ICD-10-CM | POA: Diagnosis not present

## 2021-10-10 DIAGNOSIS — R0609 Other forms of dyspnea: Secondary | ICD-10-CM

## 2021-10-10 DIAGNOSIS — D62 Acute posthemorrhagic anemia: Secondary | ICD-10-CM

## 2021-10-10 DIAGNOSIS — K922 Gastrointestinal hemorrhage, unspecified: Secondary | ICD-10-CM | POA: Diagnosis not present

## 2021-10-10 DIAGNOSIS — I10 Essential (primary) hypertension: Secondary | ICD-10-CM | POA: Diagnosis not present

## 2021-10-10 LAB — CBC
HCT: 26.7 % — ABNORMAL LOW (ref 39.0–52.0)
Hemoglobin: 8.8 g/dL — ABNORMAL LOW (ref 13.0–17.0)
MCH: 33.5 pg (ref 26.0–34.0)
MCHC: 33 g/dL (ref 30.0–36.0)
MCV: 101.5 fL — ABNORMAL HIGH (ref 80.0–100.0)
Platelets: 131 10*3/uL — ABNORMAL LOW (ref 150–400)
RBC: 2.63 MIL/uL — ABNORMAL LOW (ref 4.22–5.81)
RDW: 14.6 % (ref 11.5–15.5)
WBC: 3.6 10*3/uL — ABNORMAL LOW (ref 4.0–10.5)
nRBC: 0 % (ref 0.0–0.2)

## 2021-10-10 LAB — MAGNESIUM: Magnesium: 2 mg/dL (ref 1.7–2.4)

## 2021-10-10 LAB — BASIC METABOLIC PANEL
Anion gap: 7 (ref 5–15)
BUN: 27 mg/dL — ABNORMAL HIGH (ref 8–23)
CO2: 23 mmol/L (ref 22–32)
Calcium: 8.1 mg/dL — ABNORMAL LOW (ref 8.9–10.3)
Chloride: 107 mmol/L (ref 98–111)
Creatinine, Ser: 1.23 mg/dL (ref 0.61–1.24)
GFR, Estimated: >60 mL/min (ref >60)
Glucose, Bld: 103 mg/dL — ABNORMAL HIGH (ref 70–99)
Potassium: 3.3 mmol/L — ABNORMAL LOW (ref 3.5–5.1)
Sodium: 137 mmol/L (ref 135–145)

## 2021-10-10 LAB — HEMOGLOBIN AND HEMATOCRIT, BLOOD
HCT: 27.7 % — ABNORMAL LOW (ref 39.0–52.0)
Hemoglobin: 9.3 g/dL — ABNORMAL LOW (ref 13.0–17.0)

## 2021-10-10 MED ORDER — GUAIFENESIN 100 MG/5ML PO LIQD
5.0000 mL | ORAL | Status: DC | PRN
  Administered 2021-10-10 (×2): 5 mL via ORAL
  Filled 2021-10-10 (×2): qty 5

## 2021-10-10 MED ORDER — SPIRONOLACTONE 25 MG PO TABS
25.0000 mg | ORAL_TABLET | Freq: Every day | ORAL | Status: DC
  Administered 2021-10-10 – 2021-10-12 (×3): 25 mg via ORAL
  Filled 2021-10-10 (×3): qty 1

## 2021-10-10 MED ORDER — POTASSIUM CHLORIDE CRYS ER 20 MEQ PO TBCR
40.0000 meq | EXTENDED_RELEASE_TABLET | Freq: Two times a day (BID) | ORAL | Status: AC
  Administered 2021-10-10 (×2): 40 meq via ORAL
  Filled 2021-10-10 (×2): qty 2

## 2021-10-10 MED ORDER — FUROSEMIDE 10 MG/ML IJ SOLN
20.0000 mg | Freq: Once | INTRAMUSCULAR | Status: AC
  Administered 2021-10-10: 20 mg via INTRAVENOUS
  Filled 2021-10-10: qty 2

## 2021-10-10 MED ORDER — CEFDINIR 300 MG PO CAPS
300.0000 mg | ORAL_CAPSULE | Freq: Two times a day (BID) | ORAL | Status: DC
  Administered 2021-10-10 – 2021-10-12 (×5): 300 mg via ORAL
  Filled 2021-10-10 (×6): qty 1

## 2021-10-10 NOTE — Progress Notes (Signed)
Patient continues to feel unwell. Worsening cough and low grade temp. Trending upward. MD aware.

## 2021-10-10 NOTE — Evaluation (Signed)
Physical Therapy Evaluation Patient Details Name: Ryan Glass MRN: [id number] DOB: 22-Mar-1956 Today's Date: 10/10/2021  History of Present Illness  Pt is a 65 y/o M who presented 10/07/21 with SOB and found to have gastric ulcer and GI bleed. PMH includes chronic systolic and diastolic heart failure, HOCM, VT s/p ICD, nonobstructive CAD, PVCs s/p ablation, paroxysmal atrial fibrillation, PFO, hypertension, hyperlipidemia, history of PE 05/2021, OSA on CPAP, rheumatoid arthritis.  Clinical Impression  This patient was seen for test of O2 sats with room air to see how his system handles the activity.  Total walk of 110' with baseline 92% sat with pulse 81, after walk with pursed lip breathing pulse 165 and sat 97%, then settled to 95% and pulse 80 after brief rest.  Did not test walking on O2 as HR was primary issue, which pt endorses as similar to yesterday's walk.   Pt is able to walk with no AD but recommend either outpatient PT or cardiac rehab to manage his symptoms during mobility, esp as pt has been so independent and working.  Follow acutely for goals of PT to test him on stairs for home when ready.       Recommendations for follow up therapy are one component of a multi-disciplinary discharge planning process, led by the attending physician.  Recommendations may be updated based on patient status, additional functional criteria and insurance authorization.  Follow Up Recommendations Outpatient PT    Assistance Recommended at Discharge PRN  Functional Status Assessment Patient has had a recent decline in their functional status and demonstrates the ability to make significant improvements in function in a reasonable and predictable amount of time.  Equipment Recommendations  None recommended by PT    Recommendations for Other Services       Precautions / Restrictions Precautions Precautions: Fall;Other (comment) (ck O2 sats, HR) Precaution Comments: monitor O2 and  HR Restrictions Weight Bearing Restrictions: No      Mobility  Bed Mobility Overal bed mobility: Modified Independent             General bed mobility comments: elevation HOB    Transfers Overall transfer level: Needs assistance Equipment used: None Transfers: Sit to/from Stand Sit to Stand: Supervision (for safety)                Ambulation/Gait Ambulation/Gait assistance: Min guard Gait Distance (Feet): 110 Feet Assistive device: None Gait Pattern/deviations: Step-through pattern;Wide base of support;Decreased stride length Gait velocity: reduced Gait velocity interpretation: <1.31 ft/sec, indicative of household ambulator General Gait Details: managed his exertion with pursed lip breathing and on room air  Stairs            Wheelchair Mobility    Modified Rankin (Stroke Patients Only)       Balance Overall balance assessment: Needs assistance Sitting-balance support: Feet supported Sitting balance-Leahy Scale: Good     Standing balance support: No upper extremity supported Standing balance-Leahy Scale: Fair                               Pertinent Vitals/Pain Pain Assessment: No/denies pain    Home Living Family/patient expects to be discharged to:: Private residence Living Arrangements: Spouse/significant other Available Help at Discharge: Family;Available PRN/intermittently Type of Home: House Home Access: Stairs to enter Entrance Stairs-Rails: Can reach both Entrance Stairs-Number of Steps: 5-6 Alternate Level Stairs-Number of Steps: Flight Home Layout: Multi-level Home Equipment: Grab bars - tub/shower Additional Comments:  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, driving and able to take care of himself    Prior Function Prior Level of Function : Independent/Modified Independent               ADLs Comments: does not own or use an AD     Hand Dominance   Dominant Hand: Right     Extremity/Trunk Assessment   Upper Extremity Assessment Upper Extremity Assessment: Defer to OT evaluation    Lower Extremity Assessment Lower Extremity Assessment: Overall WFL for tasks assessed    Cervical / Trunk Assessment Cervical / Trunk Assessment: Normal  Communication   Communication: No difficulties  Cognition Arousal/Alertness: Awake/alert Behavior During Therapy: WFL for tasks assessed/performed Overall Cognitive Status: Within Functional Limits for tasks assessed                                          General Comments General comments (skin integrity, edema, etc.): pt was at baseline 92% sat with pulse 81, after walk with pursed lip breathing pulse 165 and sat 97%, then settled to 95% and pulse 80    Exercises     Assessment/Plan    PT Assessment Patient needs continued PT services  PT Problem List Decreased activity tolerance;Decreased mobility;Cardiopulmonary status limiting activity       PT Treatment Interventions DME instruction;Gait training;Stair training;Functional mobility training;Therapeutic activities;Therapeutic exercise;Balance training;Neuromuscular re-education;Patient/family education    PT Goals (Current goals can be found in the Care Plan section)  Acute Rehab PT Goals Patient Stated Goal: to go back to work PT Goal Formulation: With patient Time For Goal Achievement: 10/24/21 Potential to Achieve Goals: Good    Frequency Min 3X/week   Barriers to discharge Decreased caregiver support home with wife who works Adult nurse               AM-PAC PT "6 Clicks" Mobility  Outcome Measure Help needed turning from your back to your side while in a flat bed without using bedrails?: None Help needed moving from lying on your back to sitting on the side of a flat bed without using bedrails?: A Little Help needed moving to and from a bed to a chair (including a wheelchair)?: A Little Help needed standing  up from a chair using your arms (e.g., wheelchair or bedside chair)?: A Little Help needed to walk in hospital room?: A Little Help needed climbing 3-5 steps with a railing? : A Little 6 Click Score: 19    End of Session Equipment Utilized During Treatment: Gait belt Activity Tolerance: Treatment limited secondary to medical complications (Comment) Patient left: in bed;with call bell/phone within reach;with bed alarm set Nurse Communication: Mobility status;Other (comment) (pulses and sats for room air walk) PT Visit Diagnosis: Other (comment) (PAF with exertion to walk)    Time: [id number] PT Time Calculation (min) (ACUTE ONLY): 19 min   Charges:   PT Evaluation $PT Eval Moderate Complexity: 1 Mod         Ivar Drape 10/10/2021, 1:15 PM  Samul Dada, PT PhD Acute Rehab Dept. Number: [facility] [id number] and [facility] [phone]

## 2021-10-10 NOTE — Progress Notes (Addendum)
Ryan Glass   Ryan Glass [id number] DOB: [birth year] DOA: 10/07/2021  PCP: [name], MD  Brief History/Interval Summary: 65 y.o. male with medical history significant of combined systolic and diastolic CHF, HOCM, VT s/p ICD, CAD, PAF, HTN, HLD, COPD, PE in 05/2021 on LE, OSA on CPAP who presented with complaints of shortness of breath.  Symptoms are suggestive of orthopnea and PND.  Denies having any ICD shocks to his knowledge.  Also had a productive cough with greenish sputum.  Not on oxygen at home.  Also mentioned blood in stool recently.  Upon EMS arrival patient was noted to have O2 saturations of 78% on room air.  Chest x-ray showed concern for atelectasis versus infiltrate at the bases.  Patient was given antibiotics.  Was hospitalized for further management.  Initially placed on IV heparin but then developed GI bleed so this was discontinued and reversed with protamine.  Also noted to be in atrial fibrillation with RVR.  Discussions were held with the cardiology and patient was given amiodarone.   Underwent upper endoscopy which showed gastric ulcer and gastritis.  Eliquis was resumed last night.  Drop in hemoglobin noted today.  We will recheck later today and tomorrow.  Reason for Visit: GI bleeding.  Atrial fibrillation with RVR.  Consultants: Cardiology.  Gastroenterology  Procedures:    EGD 10/30 Impression:               - The examined portions of the nasopharynx,                            oropharynx and larynx were normal.                           - Normal esophagus.                           - Non-bleeding gastric ulcer with no stigmata of                            bleeding. Biopsied. Suspect this is related to                            diclofenac use                           - Gastritis with hemorrhage. Biopsied.                           - Normal examined duodenum.   Subjective/Interval History: Patient mention he had a bowel  movement earlier this morning which was mostly brown in color with only small amount of blood.  Denies any abdominal pain nausea vomiting.  Continues to have a cough with yellowish expectoration.  No chest pain.  Shortness of breath has improved.    Assessment/Plan:  Acute respiratory failure with hypoxia/community-acquired pneumonia Chest x-ray suggested infiltrates bilaterally.  No evidence for aspiration.  Influenza and COVID test were negative.   Initially required BiPAP.  Transition to nasal cannula oxygen.  Now on room air.  Change ceftriaxone to [facility] today. WBC remains normal.  Procalcitonin was 0.93.   Low suspicion for PE since patient had been compliant with Eliquis.  Chest x-ray was done yesterday which shows improvement in the lung findings.  No pulmonary edema noted.  Upper GI bleed secondary to gastritis/gastric ulcer Patient seen by gastroenterology and underwent EGD on 10/30.  Showed evidence for gastric ulcer along with gastritis with hemorrhage.  Thought to be secondary to NSAIDs.   Hemoglobin had been stable but drop in hemoglobin noted this morning to 8.8.  Does not appear that patient has had any significant bleeding.  We will recheck again this afternoon and tomorrow.  Even though Eliquis was resumed last night it appears that patient refused to take his dose last night. Continue PPI.  H. pylori titers have been negative.  Acute blood loss anemia Drop in hemoglobin noted.  Has not required transfusion yet.  As mentioned above drop in hemoglobin noted.  Will be rechecked today and tomorrow.  Acute kidney injury/hypokalemia Creatinine was noted to be 1.72 on 10/30.  Patient was given IV fluids due to concern for hypovolemia from GI bleed.  Has improved to 1.23 today.  IV fluids have been discontinued.  Replace potassium.  Magnesium is 2.0.  Paroxysmal atrial fibrillation with RVR Noted to be in RVR at the time of admission.  Cardiology was consulted.  Sotalol was  continued.  Metoprolol was added by cardiology.  Heart rate was noted to be poorly controlled yesterday.  Beta-blocker dose was increased.  Seems to be better today at least at rest but did go up to 150s with ambulation.  Came back to 88 at rest.  Continue to monitor for now. Even though Eliquis was resumed last night it appears that patient refused to take his dose last night.  History of ventricular tachycardia Recently required admission in August for VT/new onset A. fib.  Underwent ICD placement.  Continue to monitor on telemetry.  History of pulmonary embolism This was in June 2022.  Looks like a small PE was detected.  No DVT was noted.  Even though Eliquis was resumed last night it appears that patient refused to take his dose last night.  Chronic diastolic CHF/HOCM Fairly euvolemic currently.  Noted to be on chlorthalidone spironolactone at home.  Held due to renal failure.  Continue to hold today.  Anticipate that these can be resumed at discharge.  Prolonged QT interval Monitor on telemetry.  History of PFO Per cardiology  Essential hypertension Blood pressure is reasonably well controlled.  Hyperlipidemia Continue statin.  Obstructive sleep apnea CPAP  Obesity Estimated body mass index is 34.18 kg/m as calculated from the following:   Height as of this encounter: 5\' 10"  (1.778 m).   Weight as of this encounter: 108 kg.   DVT Prophylaxis: Back on Eliquis Code Status: Full code Family Communication: Discussed with patient Disposition Plan: Hopefully return home when improved  Status is: Inpatient  Remains inpatient appropriate because: GI bleeding, atrial fibrillation, acute respiratory failure with hypoxia      Medications: Scheduled:  apixaban  5 mg Oral BID   atorvastatin  40 mg Oral QHS   gabapentin  600 mg Oral TID with meals   guaiFENesin  600 mg Oral BID   metoprolol tartrate  50 mg Oral BID   pantoprazole  40 mg Oral BID   potassium chloride  40  mEq Oral BID   predniSONE  5 mg Oral Q breakfast   sodium chloride flush  3 mL Intravenous Q12H   sotalol  120 mg Oral Q12H   spironolactone  25 mg Oral Daily   sucralfate  1 g Oral TID WC & HS   Continuous:  cefTRIAXone (ROCEPHIN)  IV Stopped (10/09/21 1534)   TKZ:[id number] **OR** acetaminophen, acetaminophen-codeine, alum & mag hydroxide-simeth, guaiFENesin, levalbuterol, trimethobenzamide  Antibiotics: Anti-infectives (From admission, onward)    Start     Dose/Rate Route Frequency Ordered Stop   10/08/21 1530  cefTRIAXone (ROCEPHIN) 1 g in sodium chloride 0.9 % 100 mL IVPB        1 g 200 mL/hr over 30 Minutes Intravenous Every 24 hours 10/07/21 1842     10/07/21 1345  cefTRIAXone (ROCEPHIN) 1 g in sodium chloride 0.9 % 100 mL IVPB        1 g 200 mL/hr over 30 Minutes Intravenous  Once 10/07/21 1341 10/07/21 1628   10/07/21 1345  azithromycin (ZITHROMAX) 500 mg in sodium chloride 0.9 % 250 mL IVPB  Status:  Discontinued        500 mg 250 mL/hr over 60 Minutes Intravenous  Once 10/07/21 1341 10/07/21 1424       Objective:  Vital Signs  Vitals:   10/10/21 0538 10/10/21 0917 10/10/21 0946 10/10/21 0951  BP: 124/87 136/82  136/82  Pulse: 80 81  81  Resp:  18    Temp: 99.5 F (37.5 C) 99.6 F (37.6 C) 100.1 F (37.8 C)   TempSrc: Oral Oral    SpO2: 93% 95%    Weight: 108 kg     Height:        Intake/Output Summary (Last 24 hours) at 10/10/2021 1156 Last data filed at 10/10/2021 0935 Gross per 24 hour  Intake 1950.81 ml  Output 1225 ml  Net 725.81 ml    Filed Weights   10/08/21 0500 10/09/21 0439 10/10/21 0538  Weight: 103 kg 110 kg 108 kg    General appearance: Awake alert.  In no distress Resp: Normal effort at rest.  Coarse breath sound bilaterally with few crackles at the bases.  No wheezing or rhonchi. Cardio: S1-S2 is normal regular.  No S3-S4.  No rubs murmurs or bruit GI: Abdomen is soft.  Nontender nondistended.  Bowel sounds are present normal.   No masses organomegaly Extremities: No edema.  Full range of motion of lower extremities. Neurologic: Alert and oriented x3.  No focal neurological deficits.      Lab Results:  Data Reviewed: I have personally reviewed following labs and imaging studies  CBC: Recent Labs  Lab 10/07/21 1031 10/07/21 1253 10/07/21 2107 10/08/21 0907 10/08/21 1337 10/09/21 0216 10/10/21 0251  WBC 6.3  --   --  8.8 7.2 5.5 3.6*  NEUTROABS 4.9  --   --   --   --   --   --   HGB 11.7*   < > 10.5* 9.7* 9.6* 9.8* 8.8*  HCT 36.0*   < > 32.0* 28.8* 28.6* 30.2* 26.7*  MCV 106.2*  --   --  100.7* 102.1* 103.1* 101.5*  PLT 185  --   --  142* 144* 139* 131*   < > = values in this interval not displayed.     Basic Metabolic Panel: Recent Labs  Lab 10/05/21 1130 10/07/21 1031 10/07/21 1253 10/08/21 0318 10/09/21 0216 10/10/21 0251  NA 141 138 140  141 136 137 137  K 4.4 3.9 3.4*  3.4* 2.9* 3.2* 3.3*  CL 108* 108 109 106 107 107  CO2 17* 19*  --  19* 21* 23  GLUCOSE 138* 152* 145* 147* 105* 103*  BUN 25 48* 43* 56* 42* 27*  CREATININE 1.30* 1.13 1.10 1.72* 1.36* 1.23  CALCIUM 8.9 9.0  --  8.6* 8.4* 8.1*  MG  --   --   --   --   --  2.0     GFR: Estimated Creatinine Clearance: 73.7 mL/min (by C-G formula based on SCr of 1.23 mg/dL).  Liver Function Tests: Recent Labs  Lab 10/07/21 1031  AST 28  ALT 26  ALKPHOS 51  BILITOT 1.1  PROT 5.9*  ALBUMIN 3.6      Coagulation Profile: Recent Labs  Lab 10/07/21 2107  INR 1.2      BNP (last 3 results) Recent Labs    02/03/21 1515 02/06/21 1028 09/19/21 0938  PROBNP 141 47 355      Recent Results (from the past 240 hour(s))  Culture, blood (routine x 2)     Status: None (Preliminary result)   Collection Time: 10/07/21 10:30 AM   Specimen: BLOOD  Result Value Ref Range Status   Specimen Description BLOOD RIGHT ANTECUBITAL  Final   Special Requests   Final    BOTTLES DRAWN AEROBIC AND ANAEROBIC Blood Culture adequate  volume   Culture   Final    NO GROWTH 3 DAYS Performed at [facility], [place] [address], [place], [place] [address]    Report Status PENDING  Incomplete  Resp Panel by RT-PCR (Flu A&B, Covid) Nasopharyngeal Swab     Status: None   Collection Time: 10/07/21 10:32 AM   Specimen: Nasopharyngeal Swab; Nasopharyngeal(NP) swabs in vial transport medium  Result Value Ref Range Status   SARS Coronavirus 2 by RT PCR NEGATIVE NEGATIVE Final    Comment: (Glass) SARS-CoV-2 target nucleic acids are NOT DETECTED.  The SARS-CoV-2 RNA is generally detectable in upper respiratory specimens during the acute phase of infection. The lowest concentration of SARS-CoV-2 viral copies this assay can detect is 138 copies/mL. A negative result does not preclude SARS-Cov-2 infection and should not be used as the sole basis for treatment or other patient management decisions. A negative result may occur with  improper specimen collection/handling, submission of specimen other than nasopharyngeal swab, presence of viral mutation(s) within the areas targeted by this assay, and inadequate number of viral copies(<138 copies/mL). A negative result must be combined with clinical observations, patient history, and epidemiological information. The expected result is Negative.  Fact Sheet for Patients:  EntrepreneurPulse.com.au  Fact Sheet for Healthcare Providers:  IncredibleEmployment.be  This test is no t yet approved or cleared by the [place] FDA and  has been authorized for detection and/or diagnosis of SARS-CoV-2 by FDA under an Emergency Use Authorization (EUA). This EUA will remain  in effect (meaning this test can be used) for the duration of the COVID-19 declaration under Section 564(b)(1) of the Act, 21 U.S.C.section 360bbb-3(b)(1), unless the authorization is terminated  or revoked sooner.       Influenza A by PCR NEGATIVE NEGATIVE Final   Influenza B by  PCR NEGATIVE NEGATIVE Final    Comment: (Glass) The Xpert Xpress SARS-CoV-2/FLU/RSV plus assay is intended as an aid in the diagnosis of influenza from Nasopharyngeal swab specimens and should not be used as a sole basis for treatment. Nasal washings and aspirates are unacceptable for Xpert Xpress SARS-CoV-2/FLU/RSV testing.  Fact Sheet for Patients: EntrepreneurPulse.com.au  Fact Sheet for Healthcare Providers: IncredibleEmployment.be  This test is not yet approved or cleared by the [place] FDA and has been authorized for detection and/or diagnosis of SARS-CoV-2 by FDA under an Emergency  Use Authorization (EUA). This EUA will remain in effect (meaning this test can be used) for the duration of the COVID-19 declaration under Section 564(b)(1) of the Act, 21 U.S.C. section 360bbb-3(b)(1), unless the authorization is terminated or revoked.  Performed at [facility], [place] [address] [place], [place] [address]   Culture, blood (routine x 2)     Status: None (Preliminary result)   Collection Time: 10/07/21  9:07 PM   Specimen: BLOOD RIGHT HAND  Result Value Ref Range Status   Specimen Description BLOOD RIGHT HAND  Final   Special Requests AEROBIC BOTTLE ONLY Blood Culture adequate volume  Final   Culture   Final    NO GROWTH 3 DAYS Performed at [facility], [place] [address] [place], [place] [address]    Report Status PENDING  Incomplete       Radiology Studies: DG Chest Port 1 View  Result Date: 10/09/2021 CLINICAL DATA:  Pneumonia EXAM: PORTABLE CHEST 1 VIEW COMPARISON:  Previous studies including the examination of 10/07/2021 FINDINGS: Transverse diameter of heart is increased. Pacemaker/defibrillator battery is seen in the left infraclavicular region. There are no signs of pulmonary edema. There is interval decrease in infiltrates in both lower lung fields suggesting resolving pneumonia/atelectasis. There is no significant  pleural effusion or pneumothorax. IMPRESSION: There is interval improvement in aeration of both lower lung fields suggesting resolving atelectasis/pneumonitis. There are no new infiltrates or signs of pulmonary edema. Electronically Signed   By: Elmer Picker M.D.   On: 10/09/2021 08:57       LOS: 3 days   [facility] Pager on [email or web address]  10/10/2021, 11:56 AM

## 2021-10-10 NOTE — Telephone Encounter (Signed)
Reviewed Epic notes for previous 24 hours.  Spoke with patient via telephone 7 minutes spoke full sentences without difficulty no cough/throat clearing audible during conversation but patient did have nasal congestion.  Unable to wear cpap last night due to congestion.  Patient nasal saline at home he will have family member bring his bottle to hospital tomorrow to use for sinus rinses.  On RA now and at rest sp02 greater than 90%.  Patient notified during walk during therapy dropped into 80s and began not to feel well returned to baseline once sitting in chair and resting.  Discussed with patient anemia will affect oxygen delivery to tissues/organs and can be contributed to increased heartrate and drop in sp02 during ambulation.  Fewer Red Blood Cells to carry oxygen in the body.  Patient concerned h/h dropped on am lab draw.  Discussed slightly improved at lunch recheck.  Patient reported he had eliquis this am. Discussed with patient ulcer/bleeding resulted in anemia. Discussed with patient HR Tonya recommended he call [employer] regarding disability insurance so he does not miss paycheck this week.  Patient reported he didn't call today because unsure of discharge date and when can return to work.  Patient stated he would call [employer] POC tomorrow after talking with his doctors in am.  Discussed with patient RTW date not currently discussed in Epic.  Discharge date possible 24-48 hours if no further GI bleeding with restart of eliguis and sp02 stable.  Spouse with him at hospital.  Discussed potassium still low along with calcium.  Kidney function normal today labs.  Patient and spouse questions answered and discussed will follow up with them again tomorrow via telephone.  Patient and spouse verbalized understanding information/instructions, agreed with plan of care and had no further questions at this time.

## 2021-10-10 NOTE — Evaluation (Signed)
Occupational Therapy Evaluation Patient Details Name: Ryan Glass MRN: [id number] DOB: [birth year] Today's Date: 10/10/2021   History of Present Illness Pt is a 65 y/o M who presented 10/07/21 with SOB and found to have gastric ulcer and GI bleed. PMH includes chronic systolic and diastolic heart failure, HOCM, VT s/p ICD, nonobstructive CAD, PVCs s/p ablation, paroxysmal atrial fibrillation, PFO, hypertension, hyperlipidemia, history of PE 05/2021, OSA on CPAP, rheumatoid arthritis.   Clinical Impression   Pt presents with decreased balance and activity tolerance. Pt currently requiring supervision with LB ADLs and functional transfers/mobility. Pt reports being independent at baseline, working and driving. Pt lives with spouse who works but will be available to provide assistance as needed after 2:00 pm most days. Pt should be safe to return home without any further skilled OT services once medically cleared. Would likely benefit from [facility] for use as shower chair at home to improve safety and conserve energy while showering. Will follow acutely to improve safety/independence with ADLs and educate on energy conservation.      Recommendations for follow up therapy are one component of a multi-disciplinary discharge planning process, led by the attending physician.  Recommendations may be updated based on patient status, additional functional criteria and insurance authorization.   Follow Up Recommendations  No OT follow up    Assistance Recommended at Discharge Intermittent Supervision/Assistance  Functional Status Assessment  Patient has had a recent decline in their functional status and demonstrates the ability to make significant improvements in function in a reasonable and predictable amount of time.  Equipment Recommendations  BSC    Recommendations for Other Services PT consult     Precautions / Restrictions Precautions Precautions: Fall;Other (comment) Precaution Comments: monitor  O2 and HR Restrictions Weight Bearing Restrictions: No      Mobility Bed Mobility Overal bed mobility: Modified Independent             General bed mobility comments: HOB elevated and use of rails    Transfers Overall transfer level: Needs assistance Equipment used: None Transfers: Sit to/from Stand Sit to Stand: Supervision                  Balance Overall balance assessment: Mild deficits observed, not formally tested                                         ADL either performed or assessed with clinical judgement   ADL Overall ADL's : Needs assistance/impaired Eating/Feeding: Independent   Grooming: Supervision/safety;Standing   Upper Body Bathing: Independent;Sitting   Lower Body Bathing: Supervison/ safety;Sit to/from stand   Upper Body Dressing : Independent;Sitting   Lower Body Dressing: Supervision/safety;Sit to/from stand   Toilet Transfer: Retail banker;Ambulation;Grab bars   Toileting- Clothing Manipulation and Hygiene: Independent       Functional mobility during ADLs: Supervision/safety General ADL Comments: Limited by balance and activity tolerance.     Vision   Vision Assessment?: No apparent visual deficits     Perception     Praxis      Pertinent Vitals/Pain Pain Assessment: No/denies pain     Hand Dominance Right   Extremity/Trunk Assessment Upper Extremity Assessment Upper Extremity Assessment: Overall WFL for tasks assessed   Lower Extremity Assessment Lower Extremity Assessment: Defer to PT evaluation       Communication Communication Communication: No difficulties   Cognition Arousal/Alertness: Awake/alert Behavior During Therapy:  WFL for tasks assessed/performed Overall Cognitive Status: Within Functional Limits for tasks assessed                                       General Comments  O2 95% on RA on arrival. Decreased to 87% during amublation and  toilet transfer with pt visibly SOB requiring rest. Recovered back to 94% after approx. 1-2 mins of rest.    Exercises     Shoulder Instructions      Home Living Family/patient expects to be discharged to:: Private residence Living Arrangements: Spouse/significant other Available Help at Discharge: Family;Available PRN/intermittently Type of Home: House Home Access: Stairs to enter [employer] of Steps: 5-6 Entrance Stairs-Rails: Can reach both Home Layout: Multi-level Alternate Level Stairs-Number of Steps: Flight Alternate Level Stairs-Rails: Left;Right Bathroom Shower/Tub: Chief Strategy Officer: Standard     Home Equipment: Grab bars - tub/shower          Prior Functioning/Environment Prior Level of Function : Independent/Modified Independent;Working/employed;Driving               ADLs Comments: 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        OT Problem List: Decreased activity tolerance;Impaired balance (sitting and/or standing);Decreased safety awareness;Decreased knowledge of use of DME or AE;Decreased knowledge of precautions;Cardiopulmonary status limiting activity      OT Treatment/Interventions: Self-care/ADL training;Therapeutic exercise;Neuromuscular education;Energy conservation;DME and/or AE instruction;Therapeutic activities;Patient/family education;Balance training    OT Goals(Current goals can be found in the care plan section) Acute Rehab OT Goals Patient Stated Goal: return to work OT Goal Formulation: With patient Time For Goal Achievement: 10/24/21 Potential to Achieve Goals: Good  OT Frequency: Min 2X/week   Barriers to D/C:            Co-evaluation              AM-PAC OT "6 Clicks" Daily Activity     Outcome Measure Help from another person eating meals?: None Help from another person taking care of personal grooming?: A Little Help from another  person toileting, which includes using toliet, bedpan, or urinal?: A Little Help from another person bathing (including washing, rinsing, drying)?: A Little Help from another person to put on and taking off regular upper body clothing?: None Help from another person to put on and taking off regular lower body clothing?: A Little 6 Click Score: 20   End of Session Equipment Utilized During Treatment: Gait belt Nurse Communication: Mobility status  Activity Tolerance: Patient limited by fatigue Patient left: in bed;with call bell/phone within reach  OT Visit Diagnosis: Unsteadiness on feet (R26.81)                Time: 7353-2992 OT Time Calculation (min): 17 min Charges:  OT General Charges $OT Visit: 1 Visit OT Evaluation $OT Eval Moderate Complexity: 1 Mod  Teven Mittman C, OT/L  Acute Rehab [phone]  Lenice Llamas 10/10/2021, 10:57 AM

## 2021-10-10 NOTE — Progress Notes (Signed)
Progress Note  Patient Name: Ryan Glass Date of Encounter: 10/10/2021  [facility] Cardiologist: [name], MD   Subjective   CBC this AM down from 9.8 to 8.8. Received PM dose of apixaban yesterday and AM dose today. Did have BM this AM but did not note increase in melena/hematochezia, states that it appeared more normal than yesterday. Has walked some, feels fatigue, heart rate increases with activity. Overall though feels better but still easily fatigued.  Inpatient Medications    Scheduled Meds:  apixaban  5 mg Oral BID   atorvastatin  40 mg Oral QHS   cefdinir  300 mg Oral Q12H   gabapentin  600 mg Oral TID with meals   guaiFENesin  600 mg Oral BID   metoprolol tartrate  50 mg Oral BID   pantoprazole  40 mg Oral BID   potassium chloride  40 mEq Oral BID   predniSONE  5 mg Oral Q breakfast   sodium chloride flush  3 mL Intravenous Q12H   sotalol  120 mg Oral Q12H   spironolactone  25 mg Oral Daily   sucralfate  1 g Oral TID WC & HS   Continuous Infusions:   PRN Meds: acetaminophen **OR** acetaminophen, acetaminophen-codeine, alum & mag hydroxide-simeth, guaiFENesin, levalbuterol, trimethobenzamide   Vital Signs    Vitals:   10/10/21 0538 10/10/21 0917 10/10/21 0946 10/10/21 0951  BP: 124/87 136/82  136/82  Pulse: 80 81  81  Resp:  18    Temp: 99.5 F (37.5 C) 99.6 F (37.6 C) 100.1 F (37.8 C)   TempSrc: Oral Oral    SpO2: 93% 95%    Weight: 108 kg     Height:        Intake/Output Summary (Last 24 hours) at 10/10/2021 1210 Last data filed at 10/10/2021 0935 Gross per 24 hour  Intake 1840.81 ml  Output 1225 ml  Net 615.81 ml   Last 3 Weights 10/10/2021 10/09/2021 10/08/2021  Weight (lbs) 238 lb 3.2 oz 242 lb 8.1 oz 227 lb 1.2 oz  Weight (kg) 108.047 kg 110 kg 103 kg      Telemetry    Since 10 PM 10/31, predominantly atrial paced, ventricular sensed- Personally Reviewed  ECG    No new since 10/07/21 - Personally Reviewed  Physical  Exam   GEN: Well nourished, well developed in no acute distress NECK: JVD at clavicle at 30 degrees CARDIAC: regular rhythm, normal S1 and S2, no rubs or gallops. No murmur. VASCULAR: Radial pulses 2+ bilaterally.  RESPIRATORY:  Clear to auscultation without rales, wheezing or rhonchi  ABDOMEN: Soft, non-tender, non-distended MUSCULOSKELETAL:  Moves all 4 limbs independently SKIN: Warm and dry, no significant edema NEUROLOGIC:  No focal neuro deficits noted. PSYCHIATRIC:  Normal affect    Labs    High Sensitivity Troponin:   Recent Labs  Lab 10/07/21 1031 10/07/21 1231  TROPONINIHS 12 12     Chemistry Recent Labs  Lab 10/07/21 1031 10/07/21 1253 10/08/21 0318 10/09/21 0216 10/10/21 0251  NA 138   < > 136 137 137  K 3.9   < > 2.9* 3.2* 3.3*  CL 108   < > 106 107 107  CO2 19*  --  19* 21* 23  GLUCOSE 152*   < > 147* 105* 103*  BUN 48*   < > 56* 42* 27*  CREATININE 1.13   < > 1.72* 1.36* 1.23  CALCIUM 9.0  --  8.6* 8.4* 8.1*  MG  --   --   --   --  2.0  PROT 5.9*  --   --   --   --   ALBUMIN 3.6  --   --   --   --   AST 28  --   --   --   --   ALT 26  --   --   --   --   ALKPHOS 51  --   --   --   --   BILITOT 1.1  --   --   --   --   GFRNONAA >60  --  44* 58* >60  ANIONGAP 11  --  11 9 7    < > = values in this interval not displayed.    Lipids No results for input(s): CHOL, TRIG, HDL, LABVLDL, LDLCALC, CHOLHDL in the last 168 hours.  Hematology Recent Labs  Lab 10/08/21 1337 10/09/21 0216 10/10/21 0251  WBC 7.2 5.5 3.6*  RBC 2.80* 2.93* 2.63*  HGB 9.6* 9.8* 8.8*  HCT 28.6* 30.2* 26.7*  MCV 102.1* 103.1* 101.5*  MCH 34.3* 33.4 33.5  MCHC 33.6 32.5 33.0  RDW 14.4 14.6 14.6  PLT 144* 139* 131*   Thyroid No results for input(s): TSH, FREET4 in the last 168 hours.  BNP Recent Labs  Lab 10/07/21 1031  BNP 33.9    DDimer No results for input(s): DDIMER in the last 168 hours.   Radiology    DG Chest Port 1 View  Result Date: 10/09/2021 CLINICAL  DATA:  Pneumonia EXAM: PORTABLE CHEST 1 VIEW COMPARISON:  Previous studies including the examination of 10/07/2021 FINDINGS: Transverse diameter of heart is increased. Pacemaker/defibrillator battery is seen in the left infraclavicular region. There are no signs of pulmonary edema. There is interval decrease in infiltrates in both lower lung fields suggesting resolving pneumonia/atelectasis. There is no significant pleural effusion or pneumothorax. IMPRESSION: There is interval improvement in aeration of both lower lung fields suggesting resolving atelectasis/pneumonitis. There are no new infiltrates or signs of pulmonary edema. Electronically Signed   By: Elmer Picker M.D.   On: 10/09/2021 08:57    Cardiac Studies   TEE 08/2021: 1. Will have Dr Burt Knack review regarding closure of PFO in regard to  patients hypoxemai.   2. Left ventricular ejection fraction, by estimation, is 55 to 60%. The  left ventricle has normal function. There is mild left ventricular  hypertrophy. Left ventricular diastolic function could not be evaluated.   3. AICD wires in RV/RA. Right ventricular systolic function is moderately  reduced. The right ventricular size is moderately enlarged.   4. R/L upper pulmonary veins and LLPV normal Unable to visualize RLPV.  Left atrial size was mildly dilated. No left atrial/left atrial appendage  thrombus was detected.   5. Evidence of atrial level shunting detected by color flow Doppler.  Agitated saline contrast bubble study was positive with shunting observed  within 3-6 cardiac cycles suggestive of interatrial shunt.   6. Right atrial size was mildly dilated.   7. A small pericardial effusion is present. The pericardial effusion is  anterior to the right ventricle.   8. The mitral valve is grossly normal. Mild mitral valve regurgitation.   9. The aortic valve is tricuspid. Aortic valve regurgitation is mild.  10. Aortic dilatation noted. 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 There is mild dilatation of the ascending aorta,  measuring 41 mm.   Conclusion(s)/Recommendation(s): Unable to perform while anesthetized in  endoscopy but consider r/o component  of platypnea  orthodeoxia with bubble study in standing and supine  positions in office.    Echocardiogram 07/2021: 1. Left ventricular ejection fraction, by estimation, is 60 to 65%. The  left ventricle has normal function. The left ventricle has no regional  wall motion abnormalities. The left ventricular internal cavity size was  mildly dilated. There is mild left  ventricular hypertrophy. Left ventricular diastolic parameters are  consistent with Grade I diastolic dysfunction (impaired relaxation).   2. Right ventricular systolic function is mildly reduced. The right  ventricular size is mildly enlarged.   3. Left atrial size was mildly dilated.   4. The mitral valve is normal in structure. No evidence of mitral valve  regurgitation. No evidence of mitral stenosis.   5. The aortic valve is tricuspid. Aortic valve regurgitation is not  visualized. Mild aortic valve sclerosis is present, with no evidence of  aortic valve stenosis.   6. Aortic dilatation noted. There is moderate dilatation of the aortic  root, measuring 46 mm. There is mild dilatation of the ascending aorta,  measuring 40 mm.   7. Agitated saline contrast bubble study was positive with shunting  observed within 3-6 cardiac cycles suggestive of interatrial shunt.   R/LHC 05/2021: Prox RCA lesion is 20% stenosed. Prox Cx to Mid Cx lesion is 20% stenosed. Mid LAD lesion is 30% stenosed.   Mild non-obstructive CAD Normal right and left heart pressures   Recommendations: Medical management of mild CAD. Will resume IV heparin 6 hours post sheath pull given acute PE. Would transition to Eliquis or Xarelto tomorrow.   Patient Profile     65 y.o. male with PMH chronic systolic and diastolic heart failure,  HOCM, VT s/p ICD, nonobstructive CAD, PVCs s/p ablation, paroxysmal atrial fibrillation, PFO, hypertension, hyperlipidemia, history of PE 05/2021, OSA on CPAP, rheumatoid arthritis who presented with shortness of breath and found to have GI bleed.  Assessment & Plan    Shortness of breath Presentation with Acute hypoxic respiratory failure OSA on CPAP (though reports being unable to tolerate) Chronic systolic and diastolic heart failure with recovered EF PFO -appears slightly volume up today, restarting spironolactone -weaned off O2, now on room air with sats in the mid-90s -admission weight 102.9 kg, current weight 108 kg, but noted as net negative. Suspect I/O inaccurate. He was hypovolemic on admission, so would not aim for admission weight as goal  Paroxysmal atrial fibrillation History of recent pulmonary embolism Gastric ulcer, GI bleeding Nonobstructive CAD -chadsvasc=6, recent PE. Both require anticoagulation. Was on apixaban 5 mg BID prior to admission -EGD with gastric ulcer and gastritis. Hgb down slightly today but he reports clinical improvement in his bowel movements. 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 Would  restart oral lasix tomorrow as needed for weight gain/swelling as long as K improved.  Hyperlipidemia -last lipids 6.2022 reviewed, LDL 61 -continue atorvastatin 40 mg daily  Acute kidney injury, resolved -Cr 1.23 today, baseline appears to be 1.1-1.2, peak 1.72 this admission -restarting spironolactone today -restart losartan when BP allows  Hypokalemia: -K 3.3 again today, aim for >4 given arrhythmia history. Supplemented today -takes spironolactone, losartan, K supplement at home. Restarting spironolactone today. Would stop home chlorthalidone, will likely not need K supplement unless he requires lasix  For questions or updates, please contact [place] Please consult [email or web address] for contact info under        Signed, Buford Dresser, MD  10/10/2021, 12:10 PM

## 2021-10-10 NOTE — Progress Notes (Signed)
Mobility Specialist Progress Note    10/10/21 1540  Mobility  Activity Ambulated in hall  Level of Assistance Independent  Assistive Device None  Distance Ambulated (ft) 55 ft  Mobility Ambulated independently in hallway  Mobility Response Tolerated poorly  Mobility performed by Mobility specialist  $Mobility charge 1 Mobility   Pre-Mobility: 80 HR, 131/83 BP, 94% SpO2 During Mobility: 84% SpO2 Post-Mobility: 80 HR, BP, 97% SpO2  Pt received in bed and c/o feeling feverish. Started to feel lightheaded and SpO2 read 86%. Encouraged pursed lip breathing but got chair and rolled back to bed. Upon return to room SpO2 recovered to 97%. RN notified. Pt left in bed with call bell in reach.   [place] Mobility Specialist  Mobility Specialist Phone: [phone]

## 2021-10-11 ENCOUNTER — Ambulatory Visit: Payer: No Typology Code available for payment source | Admitting: Cardiology

## 2021-10-11 DIAGNOSIS — Z86711 Personal history of pulmonary embolism: Secondary | ICD-10-CM | POA: Diagnosis not present

## 2021-10-11 DIAGNOSIS — R0609 Other forms of dyspnea: Secondary | ICD-10-CM | POA: Diagnosis not present

## 2021-10-11 DIAGNOSIS — I421 Obstructive hypertrophic cardiomyopathy: Secondary | ICD-10-CM | POA: Diagnosis not present

## 2021-10-11 DIAGNOSIS — I480 Paroxysmal atrial fibrillation: Secondary | ICD-10-CM | POA: Diagnosis not present

## 2021-10-11 DIAGNOSIS — D62 Acute posthemorrhagic anemia: Secondary | ICD-10-CM | POA: Diagnosis not present

## 2021-10-11 DIAGNOSIS — K254 Chronic or unspecified gastric ulcer with hemorrhage: Secondary | ICD-10-CM

## 2021-10-11 DIAGNOSIS — J9601 Acute respiratory failure with hypoxia: Secondary | ICD-10-CM | POA: Diagnosis not present

## 2021-10-11 LAB — CBC
HCT: 26.6 % — ABNORMAL LOW (ref 39.0–52.0)
Hemoglobin: 9.2 g/dL — ABNORMAL LOW (ref 13.0–17.0)
MCH: 34.7 pg — ABNORMAL HIGH (ref 26.0–34.0)
MCHC: 34.6 g/dL (ref 30.0–36.0)
MCV: 100.4 fL — ABNORMAL HIGH (ref 80.0–100.0)
Platelets: 126 10*3/uL — ABNORMAL LOW (ref 150–400)
RBC: 2.65 MIL/uL — ABNORMAL LOW (ref 4.22–5.81)
RDW: 14.5 % (ref 11.5–15.5)
WBC: 3.7 10*3/uL — ABNORMAL LOW (ref 4.0–10.5)
nRBC: 0 % (ref 0.0–0.2)

## 2021-10-11 LAB — BASIC METABOLIC PANEL
Anion gap: 7 (ref 5–15)
BUN: 25 mg/dL — ABNORMAL HIGH (ref 8–23)
CO2: 22 mmol/L (ref 22–32)
Calcium: 7.9 mg/dL — ABNORMAL LOW (ref 8.9–10.3)
Chloride: 103 mmol/L (ref 98–111)
Creatinine, Ser: 1.45 mg/dL — ABNORMAL HIGH (ref 0.61–1.24)
GFR, Estimated: 53 mL/min — ABNORMAL LOW (ref >60)
Glucose, Bld: 95 mg/dL (ref 70–99)
Potassium: 3.5 mmol/L (ref 3.5–5.1)
Sodium: 132 mmol/L — ABNORMAL LOW (ref 135–145)

## 2021-10-11 LAB — SURGICAL PATHOLOGY

## 2021-10-11 MED ORDER — POTASSIUM CHLORIDE CRYS ER 20 MEQ PO TBCR
40.0000 meq | EXTENDED_RELEASE_TABLET | Freq: Once | ORAL | Status: AC
  Administered 2021-10-11: 40 meq via ORAL
  Filled 2021-10-11: qty 2

## 2021-10-11 MED ORDER — FUROSEMIDE 40 MG PO TABS
40.0000 mg | ORAL_TABLET | Freq: Every day | ORAL | Status: DC
  Administered 2021-10-11 – 2021-10-12 (×2): 40 mg via ORAL
  Filled 2021-10-11 (×2): qty 1

## 2021-10-11 MED ORDER — MENTHOL 3 MG MT LOZG
1.0000 | LOZENGE | OROMUCOSAL | Status: DC | PRN
  Administered 2021-10-11 (×2): 3 mg via ORAL
  Filled 2021-10-11: qty 9

## 2021-10-11 NOTE — Progress Notes (Signed)
Mobility Specialist Progress Note    10/11/21 1631  Mobility  Activity Ambulated in hall  Level of Assistance Standby assist, set-up cues, supervision of patient - no hands on  Assistive Device Four wheel walker  Distance Ambulated (ft) 230 ft  Mobility Ambulated with assistance in hallway  Mobility Response Tolerated well  Mobility performed by Mobility specialist  $Mobility charge 1 Mobility   Pre-Mobility: 80 HR During Mobility: 86% SpO2 Post-Mobility: 80 HR, 92% SpO2  Pt received sitting EOB and agreeable but feeling "a little feverish". SpO2 dropped to 85-86% but pt had no complaints and would recover with pursed lip breathing technique to 92%. HR maintained in the 80s throughout walk and did not stop for any breaks. Returned to sitting EOB with call bell in reach.   [place] Mobility Specialist  Mobility Specialist Phone: [phone]

## 2021-10-11 NOTE — Progress Notes (Signed)
[facility] PROGRESS NOTE   Ryan Glass [id number] DOB: Oct 21, 1956 DOA: 10/07/2021  PCP: Ryan Peers, MD  Brief History/Interval Summary: 65 y.o. male with medical history significant of combined systolic and diastolic CHF, HOCM, VT s/p ICD, CAD, PAF, HTN, HLD, COPD, PE in 05/2021 on LE, OSA on CPAP who presented with complaints of shortness of breath.  Symptoms are suggestive of orthopnea and PND.  Denies having any ICD shocks to his knowledge.  Also had a productive cough with greenish sputum.  Not on oxygen at home.  Also mentioned blood in stool recently.  Upon EMS arrival patient was noted to have O2 saturations of 78% on room air.  Chest x-ray showed concern for atelectasis versus infiltrate at the bases.  Patient was given antibiotics.  Was hospitalized for further management.  Initially placed on IV heparin but then developed GI bleed so this was discontinued and reversed with protamine.  Also noted to be in atrial fibrillation with RVR.  Discussions were held with the cardiology and patient was given amiodarone.   Underwent upper endoscopy which showed gastric ulcer and gastritis.  Eliquis was 10-10-2021. Hgb stable over last 24 hours.  Reason for Visit: GI bleeding.  Atrial fibrillation with RVR.  Consultants: Cardiology.  Gastroenterology  Procedures:    EGD 10/30 Impression:               - The examined portions of the nasopharynx,                            oropharynx and larynx were normal.                           - Normal esophagus.                           - Non-bleeding gastric ulcer with no stigmata of                            bleeding. Biopsied. Suspect this is related to                            diclofenac use                           - Gastritis with hemorrhage. Biopsied.                           - Normal examined duodenum.   Subjective/Interval History:  Pt states he feels bad today. Showed me a picture of black stools this AM. States  he feels so bad because he hasn't had anything good to eat since admission. Pt's niece brought in some fruit for patient this AM. Pt states he wants [employer] for lunch.  Discussed with pt's dtr Ryan Glass. Pt agreeable to staying another night to re-assess his ability to walk. CNA that was in the room stated she had the patient on another unit last month and notes that pt is significantly weaker this admission compared to last month.  Dtr(Ryan Glass) worried about pt's frequent hospital admission in the last 6 months. Wants to know what can be done to prevent further admission.  Assessment/Plan:  Acute respiratory failure with hypoxia/community-acquired pneumonia  Chest x-ray suggested infiltrates bilaterally.  No evidence for aspiration.  Influenza and COVID test were negative.   Initially required BiPAP.  Transition to nasal cannula oxygen.  Now on room air.  Change ceftriaxone to [facility] today. WBC remains normal.  Procalcitonin was 0.93.   Low suspicion for PE since patient had been compliant with Eliquis. Chest x-ray was done yesterday which shows improvement in the lung findings.  No pulmonary edema noted.  Resolving. On po abx with cefdinir.  Pt ambulated today with and without O2. Lowest O2 sats got were 89%. Dtr(Ryan Glass) who is a Charity fundraiser knows that pt does not qualify for home O2 with RA sats of 89%.  Upper GI bleed secondary to gastritis/gastric ulcer Patient seen by gastroenterology and underwent EGD on 10/30.  Showed evidence for gastric ulcer along with gastritis with hemorrhage.  Thought to be secondary to NSAIDs.   Hemoglobin had been stable but drop in hemoglobin noted this morning to 8.8.  Does not appear that patient has had any significant bleeding.  Continue PPI.  H. pylori titers have been negative.  Eliquis restarted on 10-10-2021. Repeat HgB around 9.2 g/dl. Will repeat HgB on 10-12-2021  Acute blood loss anemia Due to GI bleed. Has not required transfusion. HgB stable at 9.2  g/dl  Acute kidney injury/hypokalemia Creatinine was noted to be 1.72 on 10/30.  Patient was given IV fluids due to concern for hypovolemia from GI bleed.  Has improved to 1.23 today.  IV fluids have been discontinued.  Replace potassium.  Magnesium is 2.0. Resolved. Scr between 1.2-1.4.  cardiology has stopped chlorthalidone.  Paroxysmal atrial fibrillation with RVR Noted to be in RVR at the time of admission.  Cardiology was consulted.  Sotalol was continued.  Metoprolol was added by cardiology.  Heart rate was noted to be poorly controlled yesterday.  Beta-blocker dose was increased.  Seems to be better today at least at rest but did go up to 150s with ambulation.  Came back to 88 at rest.  Continue to monitor for now. Even though Eliquis was resumed last night(10-10-2021) it appears that patient refused to take his dose last night.  Cardiology has changed meds to include lopressor and sotalol. Pt is off cardizem now.  History of ventricular tachycardia Recently required admission in August for VT/new onset A. fib.  Underwent ICD placement.  Continue to monitor on telemetry.  History of pulmonary embolism This was in June 2022.  Looks like a small PE was detected.  No DVT was noted.  Even though Eliquis was resumed last night it appears that patient refused to take his dose last night.  Eliquis restarted on 10-10-2021. No further bleeding since then.  Chronic diastolic CHF/HOCM Fairly euvolemic currently.  Noted to be on chlorthalidone, spironolactone at home.  Held due to renal failure.  Continue to hold today.  Anticipate that these can be resumed at discharge.  Cardiology has decided to stop chlorthalidone. Changed lasix to 40 mg daily prn(taking 40 meq kcl when taking lasix). Continue with aldactone 25 mg daily.  Cardiology f/u appointment with dr. Mayford Knife on 10-26-2021. Family aware of 10-26-2021 appointment.  Prolonged QT interval Monitor on telemetry.  History of PFO Per  cardiology  Essential hypertension Blood pressure is reasonably well controlled.  Hyperlipidemia Continue statin.  Obstructive sleep apnea CPAP  Obesity Estimated body mass index is 34.05 kg/m as calculated from the following:   Height as of this encounter: 5\' 10"  (1.778 m).   Weight as of this  encounter: 107.6 kg.   DVT Prophylaxis: Back on Eliquis Code Status: Full code Family Communication: Discussed with patient Disposition Plan: Hopefully return home when improved  Status is: Inpatient  Remains inpatient appropriate because: GI bleeding, atrial fibrillation, acute respiratory failure with hypoxia  Medications: Scheduled:  apixaban  5 mg Oral BID   atorvastatin  40 mg Oral QHS   cefdinir  300 mg Oral Q12H   furosemide  40 mg Oral Daily   gabapentin  600 mg Oral TID with meals   guaiFENesin  600 mg Oral BID   metoprolol tartrate  50 mg Oral BID   pantoprazole  40 mg Oral BID   predniSONE  5 mg Oral Q breakfast   sodium chloride flush  3 mL Intravenous Q12H   sotalol  120 mg Oral Q12H   spironolactone  25 mg Oral Daily   sucralfate  1 g Oral TID WC & HS   Continuous:   BDZ:[id number] **OR** acetaminophen, acetaminophen-codeine, alum & mag hydroxide-simeth, guaiFENesin, levalbuterol, menthol-cetylpyridinium, trimethobenzamide  Antibiotics: Anti-infectives (From admission, onward)    Start     Dose/Rate Route Frequency Ordered Stop   10/10/21 1300  cefdinir (OMNICEF) capsule 300 mg        300 mg Oral Every 12 hours 10/10/21 1204     10/08/21 1530  cefTRIAXone (ROCEPHIN) 1 g in sodium chloride 0.9 % 100 mL IVPB  Status:  Discontinued        1 g 200 mL/hr over 30 Minutes Intravenous Every 24 hours 10/07/21 1842 10/10/21 1204   10/07/21 1345  cefTRIAXone (ROCEPHIN) 1 g in sodium chloride 0.9 % 100 mL IVPB        1 g 200 mL/hr over 30 Minutes Intravenous  Once 10/07/21 1341 10/07/21 1628   10/07/21 1345  azithromycin (ZITHROMAX) 500 mg in sodium chloride 0.9  % 250 mL IVPB  Status:  Discontinued        500 mg 250 mL/hr over 60 Minutes Intravenous  Once 10/07/21 1341 10/07/21 1424       Objective:  Vital Signs  Vitals:   10/11/21 0944 10/11/21 1039 10/11/21 1207 10/11/21 1530  BP:  121/85  124/84  Pulse: 82 80  81  Resp:  20  (!) 21  Temp:  98.7 F (37.1 C) 98.5 F (36.9 C) 99.1 F (37.3 C)  TempSrc:  Oral  Oral  SpO2:  97%  96%  Weight:      Height:        Intake/Output Summary (Last 24 hours) at 10/11/2021 1623 Last data filed at 10/11/2021 1509 Gross per 24 hour  Intake 200 ml  Output 925 ml  Net -725 ml    Filed Weights   10/09/21 0439 10/10/21 0538 10/11/21 0619  Weight: 110 kg 108 kg 107.6 kg    Physical Exam Vitals and nursing note reviewed.  Constitutional:      General: He is not in acute distress.    Appearance: Normal appearance. He is not ill-appearing, toxic-appearing or diaphoretic.  HENT:     Head: Normocephalic and atraumatic.     Nose: Nose normal. No rhinorrhea.  Eyes:     General: No scleral icterus. Cardiovascular:     Rate and Rhythm: Normal rate and regular rhythm.     Pulses: Normal pulses.     Comments: HR consistent at 80 bpm. Paced. Pulmonary:     Effort: Pulmonary effort is normal.     Breath sounds: Examination of the right-upper field reveals decreased  breath sounds. Examination of the left-upper field reveals decreased breath sounds. Examination of the right-middle field reveals decreased breath sounds. Examination of the left-middle field reveals decreased breath sounds. Examination of the right-lower field reveals decreased breath sounds. Examination of the left-lower field reveals decreased breath sounds. Decreased breath sounds present. No wheezing or rhonchi.     Comments: Coarse BS bilaterally No wheezing Abdominal:     General: Bowel sounds are normal. There is no distension.     Palpations: Abdomen is soft.     Tenderness: There is no abdominal tenderness. There is no guarding  or rebound.     Hernia: No hernia is present.  Musculoskeletal:     Right lower leg: No edema.     Left lower leg: No edema.  Skin:    General: Skin is warm and dry.     Capillary Refill: Capillary refill takes less than 2 seconds.  Neurological:     General: No focal deficit present.     Mental Status: He is alert and oriented to person, place, and time.    Lab Results:  Data Reviewed: I have personally reviewed following labs and imaging studies  CBC: Recent Labs  Lab 10/07/21 1031 10/07/21 1253 10/08/21 0907 10/08/21 1337 10/09/21 0216 10/10/21 0251 10/10/21 1137 10/11/21 0246  WBC 6.3  --  8.8 7.2 5.5 3.6*  --  3.7*  NEUTROABS 4.9  --   --   --   --   --   --   --   HGB 11.7*   < > 9.7* 9.6* 9.8* 8.8* 9.3* 9.2*  HCT 36.0*   < > 28.8* 28.6* 30.2* 26.7* 27.7* 26.6*  MCV 106.2*  --  100.7* 102.1* 103.1* 101.5*  --  100.4*  PLT 185  --  142* 144* 139* 131*  --  126*   < > = values in this interval not displayed.    Basic Metabolic Panel: Recent Labs  Lab 10/07/21 1031 10/07/21 1253 10/08/21 0318 10/09/21 0216 10/10/21 0251 10/11/21 0246  NA 138 140  141 136 137 137 132*  K 3.9 3.4*  3.4* 2.9* 3.2* 3.3* 3.5  CL 108 109 106 107 107 103  CO2 19*  --  19* 21* 23 22  GLUCOSE 152* 145* 147* 105* 103* 95  BUN 48* 43* 56* 42* 27* 25*  CREATININE 1.13 1.10 1.72* 1.36* 1.23 1.45*  CALCIUM 9.0  --  8.6* 8.4* 8.1* 7.9*  MG  --   --   --   --  2.0  --      GFR: Estimated Creatinine Clearance: 62.4 mL/min (A) (by C-G formula based on SCr of 1.45 mg/dL (H)).  Liver Function Tests: Recent Labs  Lab 10/07/21 1031  AST 28  ALT 26  ALKPHOS 51  BILITOT 1.1  PROT 5.9*  ALBUMIN 3.6      Coagulation Profile: Recent Labs  Lab 10/07/21 2107  INR 1.2      BNP (last 3 results) Recent Labs    02/03/21 1515 02/06/21 1028 09/19/21 0938  PROBNP 141 47 355     Recent Results (from the past 240 hour(s))  Culture, blood (routine x 2)     Status: None  (Preliminary result)   Collection Time: 10/07/21 10:30 AM   Specimen: BLOOD  Result Value Ref Range Status   Specimen Description BLOOD RIGHT ANTECUBITAL  Final   Special Requests   Final    BOTTLES DRAWN AEROBIC AND ANAEROBIC Blood Culture adequate volume  Culture   Final    NO GROWTH 4 DAYS Performed at [facility], [place] [address], [place], [place] [address]    Report Status PENDING  Incomplete  Resp Panel by RT-PCR (Flu A&B, Covid) Nasopharyngeal Swab     Status: None   Collection Time: 10/07/21 10:32 AM   Specimen: Nasopharyngeal Swab; Nasopharyngeal(NP) swabs in vial transport medium  Result Value Ref Range Status   SARS Coronavirus 2 by RT PCR NEGATIVE NEGATIVE Final    Comment: (NOTE) SARS-CoV-2 target nucleic acids are NOT DETECTED.  The SARS-CoV-2 RNA is generally detectable in upper respiratory specimens during the acute phase of infection. The lowest concentration of SARS-CoV-2 viral copies this assay can detect is 138 copies/mL. A negative result does not preclude SARS-Cov-2 infection and should not be used as the sole basis for treatment or other patient management decisions. A negative result may occur with  improper specimen collection/handling, submission of specimen other than nasopharyngeal swab, presence of viral mutation(s) within the areas targeted by this assay, and inadequate number of viral copies(<138 copies/mL). A negative result must be combined with clinical observations, patient history, and epidemiological information. The expected result is Negative.  Fact Sheet for Patients:  EntrepreneurPulse.com.au  Fact Sheet for Healthcare Providers:  IncredibleEmployment.be  This test is no t yet approved or cleared by the [place] FDA and  has been authorized for detection and/or diagnosis of SARS-CoV-2 by FDA under an Emergency Use Authorization (EUA). This EUA will remain  in effect (meaning this test can  be used) for the duration of the COVID-19 declaration under Section 564(b)(1) of the Act, 21 U.S.C.section 360bbb-3(b)(1), unless the authorization is terminated  or revoked sooner.       Influenza A by PCR NEGATIVE NEGATIVE Final   Influenza B by PCR NEGATIVE NEGATIVE Final    Comment: (NOTE) The Xpert Xpress SARS-CoV-2/FLU/RSV plus assay is intended as an aid in the diagnosis of influenza from Nasopharyngeal swab specimens and should not be used as a sole basis for treatment. Nasal washings and aspirates are unacceptable for Xpert Xpress SARS-CoV-2/FLU/RSV testing.  Fact Sheet for Patients: EntrepreneurPulse.com.au  Fact Sheet for Healthcare Providers: IncredibleEmployment.be  This test is not yet approved or cleared by the [place] FDA and has been authorized for detection and/or diagnosis of SARS-CoV-2 by FDA under an Emergency Use Authorization (EUA). This EUA will remain in effect (meaning this test can be used) for the duration of the COVID-19 declaration under Section 564(b)(1) of the Act, 21 U.S.C. section 360bbb-3(b)(1), unless the authorization is terminated or revoked.  Performed at [facility], [place] [address] [place], [place] [address]   Culture, blood (routine x 2)     Status: None (Preliminary result)   Collection Time: 10/07/21  9:07 PM   Specimen: BLOOD RIGHT HAND  Result Value Ref Range Status   Specimen Description BLOOD RIGHT HAND  Final   Special Requests AEROBIC BOTTLE ONLY Blood Culture adequate volume  Final   Culture   Final    NO GROWTH 4 DAYS Performed at [facility], [place] [address], [place], [place] [address]    Report Status PENDING  Incomplete       Radiology Studies: No results found.    LOS: 4 days   Disposition: Home in next 24-48 hours. May need home health PT.   Kristopher Oppenheim  [facility] Pager on [email or web address]  10/11/2021, 4:23 PM

## 2021-10-11 NOTE — Progress Notes (Signed)
SATURATION QUALIFICATIONS: (This note is used to comply with regulatory documentation for home oxygen)  Patient Saturations on Room Air at Rest = 91%  Patient Saturations on Room Air while Ambulating >/= 89 %  Patient Saturations on 2 Liters of oxygen while Ambulating >/= 89 %  Please briefly explain why patient needs home oxygen: Pt able to maintain sats >/= 89% on RA and when on 2L O2, therefore may not need home O2.   Raymond Gurney, PT, DPT Acute Rehabilitation Services  Pager: [phone] Office: [phone]

## 2021-10-11 NOTE — Progress Notes (Signed)
Occupational Therapy Treatment Patient Details Name: Ryan Glass MRN: [id number] DOB: 1956-02-04 Today's Date: 10/11/2021   History of present illness Pt is a 65 y/o M who presented 10/07/21 with SOB and found to have gastric ulcer and GI bleed. PMH includes chronic systolic and diastolic heart failure, HOCM, VT s/p ICD, nonobstructive CAD, PVCs s/p ablation, paroxysmal atrial fibrillation, PFO, hypertension, hyperlipidemia, history of PE 05/2021, OSA on CPAP, rheumatoid arthritis.   OT comments  Pt progressing well towards OT goals this session. Focus of session was energy conservation strategies and self-monitoring SpO2 and HR (also work of breathing). Pt able to implement some of these strategies during grooming session and short ambulation in hallway to meet PT. POC remains appropriate and OT will continue to follow acutely.   Recommendations for follow up therapy are one component of a multi-disciplinary discharge planning process, led by the attending physician.  Recommendations may be updated based on patient status, additional functional criteria and insurance authorization.    Follow Up Recommendations  No OT follow up    Assistance Recommended at Discharge Intermittent Supervision/Assistance  Equipment Recommendations  BSC    Recommendations for Other Services      Precautions / Restrictions Precautions Precautions: Fall;Other (comment) (ck O2 sats, HR) Precaution Comments: monitor O2 and HR Restrictions Weight Bearing Restrictions: No       Mobility Bed Mobility Overal bed mobility: Modified Independent             General bed mobility comments: able to come EOB without assist, manages sheets and lines    Transfers Overall transfer level: Needs assistance Equipment used: Rollator (4 wheels) Transfers: Sit to/from Stand Sit to Stand: Supervision           General transfer comment: Educated pt on use of rollator brakes prior to sitting and standing for  safety, good compliance noted. Supervision for safety, no LOB.     Balance Overall balance assessment: Needs assistance Sitting-balance support: Feet supported Sitting balance-Leahy Scale: Good     Standing balance support: No upper extremity supported Standing balance-Leahy Scale: Fair Standing balance comment: Ambulates without LOB or UE support, balance deficits noted with perturbations of coughing though.                           ADL either performed or assessed with clinical judgement   ADL Overall ADL's : Needs assistance/impaired     Grooming: Supervision/safety;Standing;Oral care;Wash/dry hands Grooming Details (indicate cue type and reason): sink level                 Toilet Transfer: Retail banker;Ambulation;Grab bars   Toileting- Clothing Manipulation and Hygiene: Independent         General ADL Comments: focus on energy conservation, self-monitoring DOE and O2/HR     Vision       Perception     Praxis      Cognition Arousal/Alertness: Awake/alert Behavior During Therapy: WFL for tasks assessed/performed Overall Cognitive Status: Within Functional Limits for tasks assessed                                            Exercises     Shoulder Instructions       General Comments Re-enforced energy conservation techniques reviewed by OT earlier; SpO2 >/= 89% on RA throughout; HR in 80s; educated pt  on benefit of rollator in community at this time    Pertinent Vitals/ Pain       Pain Assessment: Faces Faces Pain Scale: No hurt Pain Intervention(s): Monitored during session  Home Living                                          Prior Functioning/Environment              Frequency  Min 2X/week        Progress Toward Goals  OT Goals(current goals can now be found in the care plan section)  Progress towards OT goals: Progressing toward goals  Acute Rehab OT  Goals Patient Stated Goal: get back to work OT Goal Formulation: With patient Time For Goal Achievement: 10/24/21 Potential to Achieve Goals: Good  Plan Discharge plan remains appropriate;Frequency remains appropriate;Equipment recommendations need to be updated    Co-evaluation                 AM-PAC OT "6 Clicks" Daily Activity     Outcome Measure   Help from another person eating meals?: None Help from another person taking care of personal grooming?: A Little Help from another person toileting, which includes using toliet, bedpan, or urinal?: A Little Help from another person bathing (including washing, rinsing, drying)?: A Little Help from another person to put on and taking off regular upper body clothing?: None Help from another person to put on and taking off regular lower body clothing?: A Little 6 Click Score: 20    End of Session Equipment Utilized During Treatment: Gait belt;Oxygen (2L)  OT Visit Diagnosis: Unsteadiness on feet (R26.81);Muscle weakness (generalized) (M62.81)   Activity Tolerance Patient tolerated treatment well   Patient Left Other (comment) (walking with PT in hallway)   Nurse Communication Mobility status        Time: [id number] OT Time Calculation (min): 23 min  Charges: OT General Charges $OT Visit: 1 Visit OT Treatments $Self Care/Home Management : 8-22 mins $Therapeutic Activity: 8-22 mins Jesse Sans OTR/L Acute Rehabilitation Services Pager: [phone] Office: [place] 10/11/2021, 12:58 PM

## 2021-10-11 NOTE — Progress Notes (Signed)
Physical Therapy Treatment Patient Details Name: Ryan Glass MRN: [id number] DOB: 01/02/1956 Today's Date: 10/11/2021   History of Present Illness Pt is a 65 y/o M who presented 10/07/21 with SOB and found to have gastric ulcer and GI bleed. PMH includes chronic systolic and diastolic heart failure, HOCM, VT s/p ICD, nonobstructive CAD, PVCs s/p ablation, paroxysmal atrial fibrillation, PFO, hypertension, hyperlipidemia, history of PE 05/2021, OSA on CPAP, rheumatoid arthritis.    PT Comments    Pt is making good progress with mobility, ambulating with a rollator and without UE support along with negotiating stairs with 1 UE support at a min guard-supervision level. He does display deficits in aerobic endurance, displaying DOE, but maintaining HR in 80s and SpO2 >/= 89% on RA throughout. Re-enforced energy conservation techniques reviewed by OT and educated pt on potential benefits of using rollator for community mobility due to his deficits in activity tolerance. Will continue to follow acutely. Current recommendations remain appropriate.    Recommendations for follow up therapy are one component of a multi-disciplinary discharge planning process, led by the attending physician.  Recommendations may be updated based on patient status, additional functional criteria and insurance authorization.  Follow Up Recommendations  Outpatient PT     Assistance Recommended at Discharge PRN  Equipment Recommendations  Rollator (4 wheels)    Recommendations for Other Services       Precautions / Restrictions Precautions Precautions: Fall;Other (comment) (ck O2 sats, HR) Precaution Comments: monitor O2 and HR Restrictions Weight Bearing Restrictions: No     Mobility  Bed Mobility Overal bed mobility: Modified Independent             General bed mobility comments: Returns to supine with mod I with HOB elevated.    Transfers Overall transfer level: Needs assistance Equipment used:  Rollator (4 wheels) Transfers: Sit to/from Stand Sit to Stand: Supervision           General transfer comment: Educated pt on use of rollator brakes prior to sitting and standing for safety, good compliance noted. Supervision for safety, no LOB.    Ambulation/Gait Ambulation/Gait assistance: Min guard;Supervision Gait Distance (Feet): 250 Feet Assistive device: None;Rollator (4 wheels) Gait Pattern/deviations: Step-through pattern;Wide base of support;Decreased stride length Gait velocity: reduced Gait velocity interpretation: <1.8 ft/sec, indicate of risk for recurrent falls General Gait Details: Educated pt on pursed lip breathing due to noted dyspnea, but maintained SpO2 >/= 89% on 2 L and even on RA throughout. Only stagger during coughing bouts, otherwise fairly steady with rollator and without UE support, min guard-supervision for safety. Educated pt to pace self.   Stairs Stairs: Yes Stairs assistance: Min guard Stair Management: One rail Left;One rail Right;Alternating pattern;Step to pattern;Forwards Number of Stairs: 10 General stair comments: Ascends with L rail and descends with R to simulate home set-up, alternating reciprocal and step-to gait pattern. No LOB, extra time though, min guard for safety.   Wheelchair Mobility    Modified Rankin (Stroke Patients Only)       Balance Overall balance assessment: Needs assistance Sitting-balance support: Feet supported Sitting balance-Leahy Scale: Good     Standing balance support: No upper extremity supported Standing balance-Leahy Scale: Fair Standing balance comment: Ambulates without LOB or UE support, balance deficits noted with perturbations of coughing though.                            Cognition Arousal/Alertness: Awake/alert Behavior During Therapy: WFL for tasks  assessed/performed Overall Cognitive Status: Within Functional Limits for tasks assessed                                           Exercises      General Comments General comments (skin integrity, edema, etc.): Re-enforced energy conservation techniques reviewed by OT earlier; SpO2 >/= 89% on RA throughout; HR in 80s; educated pt on benefit of rollator in community at this time      Pertinent Vitals/Pain Pain Assessment: Faces Faces Pain Scale: No hurt Pain Intervention(s): Monitored during session    Home Living                          Prior Function            PT Goals (current goals can now be found in the care plan section) Acute Rehab PT Goals Patient Stated Goal: to go home PT Goal Formulation: With patient Time For Goal Achievement: 10/24/21 Potential to Achieve Goals: Good Progress towards PT goals: Progressing toward goals    Frequency    Min 3X/week      PT Plan Equipment recommendations need to be updated    Co-evaluation              AM-PAC PT "6 Clicks" Mobility   Outcome Measure  Help needed turning from your back to your side while in a flat bed without using bedrails?: None Help needed moving from lying on your back to sitting on the side of a flat bed without using bedrails?: None Help needed moving to and from a bed to a chair (including a wheelchair)?: A Little Help needed standing up from a chair using your arms (e.g., wheelchair or bedside chair)?: A Little Help needed to walk in hospital room?: A Little Help needed climbing 3-5 steps with a railing? : A Little 6 Click Score: 20    End of Session Equipment Utilized During Treatment: Gait belt Activity Tolerance: Patient tolerated treatment well Patient left: in bed;with call bell/phone within reach Nurse Communication: Mobility status PT Visit Diagnosis: Unsteadiness on feet (R26.81);Difficulty in walking, not elsewhere classified (R26.2)     Time: 7858-8502 PT Time Calculation (min) (ACUTE ONLY): 16 min  Charges:  $Gait Training: 8-22 mins                     Raymond Gurney,  PT, DPT Acute Rehabilitation Services  Pager: [phone] Office: [phone]    Jewel Baize 10/11/2021, 11:26 AM

## 2021-10-11 NOTE — Progress Notes (Signed)
Progress Note  Patient Name: Ryan Glass Date of Encounter: 10/11/2021  [facility] Cardiologist: [name], MD   Subjective   CBC stable this AM. Noted that yesterday his O2 sats dropped into the 80s with ambulation and improved after rest. He continues to feel unwell, mostly fatigued. No new GI bleeding noted.   Inpatient Medications    Scheduled Meds:  apixaban  5 mg Oral BID   atorvastatin  40 mg Oral QHS   cefdinir  300 mg Oral Q12H   furosemide  40 mg Oral Daily   gabapentin  600 mg Oral TID with meals   guaiFENesin  600 mg Oral BID   metoprolol tartrate  50 mg Oral BID   pantoprazole  40 mg Oral BID   potassium chloride  40 mEq Oral Once   predniSONE  5 mg Oral Q breakfast   sodium chloride flush  3 mL Intravenous Q12H   sotalol  120 mg Oral Q12H   spironolactone  25 mg Oral Daily   sucralfate  1 g Oral TID WC & HS   Continuous Infusions:   PRN Meds: acetaminophen **OR** acetaminophen, acetaminophen-codeine, alum & mag hydroxide-simeth, guaiFENesin, levalbuterol, trimethobenzamide   Vital Signs    Vitals:   10/10/21 1933 10/11/21 0101 10/11/21 0619 10/11/21 0739  BP: 108/75 121/89 115/76 108/81  Pulse: 80 80 80 79  Resp: (!) 21 16  20   Temp: 99.8 F (37.7 C) 99.1 F (37.3 C) 99.5 F (37.5 C) 98.9 F (37.2 C)  TempSrc: Oral Oral Oral Oral  SpO2: 92% 97% 94% 94%  Weight:   107.6 kg   Height:        Intake/Output Summary (Last 24 hours) at 10/11/2021 0929 Last data filed at 10/11/2021 [id number] Gross per 24 hour  Intake 200 ml  Output 775 ml  Net -575 ml   Last 3 Weights 10/11/2021 10/10/2021 10/09/2021  Weight (lbs) 237 lb 4.8 oz 238 lb 3.2 oz 242 lb 8.1 oz  Weight (kg) 107.639 kg 108.047 kg 110 kg      Telemetry    predominantly atrial paced, ventricular sensed- Personally Reviewed  ECG    No new since 10/07/21 - Personally Reviewed  Physical Exam   GEN: Well nourished, well developed in no acute distress NECK: JVD flat at 90  degrees CARDIAC: regular rhythm, normal S1 and S2, no rubs or gallops. No murmur. VASCULAR: Radial pulses 2+ bilaterally.  RESPIRATORY:  Coarse today, worse in lower lung fields, no wheezing ABDOMEN: Soft, non-tender, non-distended MUSCULOSKELETAL:  Moves all 4 limbs independently SKIN: Warm and dry, no significant edema NEUROLOGIC:  No focal neuro deficits noted. PSYCHIATRIC:  Normal affect    Labs    High Sensitivity Troponin:   Recent Labs  Lab 10/07/21 1031 10/07/21 1231  TROPONINIHS 12 12     Chemistry Recent Labs  Lab 10/07/21 1031 10/07/21 1253 10/09/21 0216 10/10/21 0251 10/11/21 0246  NA 138   < > 137 137 132*  K 3.9   < > 3.2* 3.3* 3.5  CL 108   < > 107 107 103  CO2 19*   < > 21* 23 22  GLUCOSE 152*   < > 105* 103* 95  BUN 48*   < > 42* 27* 25*  CREATININE 1.13   < > 1.36* 1.23 1.45*  CALCIUM 9.0   < > 8.4* 8.1* 7.9*  MG  --   --   --  2.0  --   PROT 5.9*  --   --   --   --  ALBUMIN 3.6  --   --   --   --   AST 28  --   --   --   --   ALT 26  --   --   --   --   ALKPHOS 51  --   --   --   --   BILITOT 1.1  --   --   --   --   GFRNONAA >60   < > 58* >60 53*  ANIONGAP 11   < > 9 7 7    < > = values in this interval not displayed.    Lipids No results for input(s): CHOL, TRIG, HDL, LABVLDL, LDLCALC, CHOLHDL in the last 168 hours.  Hematology Recent Labs  Lab 10/09/21 0216 10/10/21 0251 10/10/21 1137 10/11/21 0246  WBC 5.5 3.6*  --  3.7*  RBC 2.93* 2.63*  --  2.65*  HGB 9.8* 8.8* 9.3* 9.2*  HCT 30.2* 26.7* 27.7* 26.6*  MCV 103.1* 101.5*  --  100.4*  MCH 33.4 33.5  --  34.7*  MCHC 32.5 33.0  --  34.6  RDW 14.6 14.6  --  14.5  PLT 139* 131*  --  126*   Thyroid No results for input(s): TSH, FREET4 in the last 168 hours.  BNP Recent Labs  Lab 10/07/21 1031  BNP 33.9    DDimer No results for input(s): DDIMER in the last 168 hours.   Radiology    No results found.  Cardiac Studies   TEE 08/2021: 1. Will have Dr Burt Knack review regarding  closure of PFO in regard to  patients hypoxemai.   2. Left ventricular ejection fraction, by estimation, is 55 to 60%. The  left ventricle has normal function. There is mild left ventricular  hypertrophy. Left ventricular diastolic function could not be evaluated.   3. AICD wires in RV/RA. Right ventricular systolic function is moderately  reduced. The right ventricular size is moderately enlarged.   4. R/L upper pulmonary veins and LLPV normal Unable to visualize RLPV.  Left atrial size was mildly dilated. No left atrial/left atrial appendage  thrombus was detected.   5. Evidence of atrial level shunting detected by color flow Doppler.  Agitated saline contrast bubble study was positive with shunting observed  within 3-6 cardiac cycles suggestive of interatrial shunt.   6. Right atrial size was mildly dilated.   7. A small pericardial effusion is present. The pericardial effusion is  anterior to the right ventricle.   8. The mitral valve is grossly normal. Mild mitral valve regurgitation.   9. The aortic valve is tricuspid. Aortic valve regurgitation is mild.  10. Aortic dilatation noted. 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 There is mild dilatation of the ascending aorta,  measuring 41 mm.   Conclusion(s)/Recommendation(s): Unable to perform while anesthetized in  endoscopy but consider r/o component  of platypnea orthodeoxia with bubble study in standing and supine  positions in office.    Echocardiogram 07/2021: 1. Left ventricular ejection fraction, by estimation, is 60 to 65%. The  left ventricle has normal function. The left ventricle has no regional  wall motion abnormalities. The left ventricular internal cavity size was  mildly dilated. There is mild left  ventricular hypertrophy. Left ventricular diastolic parameters are  consistent with Grade I diastolic dysfunction (impaired relaxation).   2. Right ventricular systolic function is mildly  reduced. The right  ventricular size is mildly enlarged.   3. Left atrial size  was mildly dilated.   4. The mitral valve is normal in structure. No evidence of mitral valve  regurgitation. No evidence of mitral stenosis.   5. The aortic valve is tricuspid. Aortic valve regurgitation is not  visualized. Mild aortic valve sclerosis is present, with no evidence of  aortic valve stenosis.   6. Aortic dilatation noted. There is moderate dilatation of the aortic  root, measuring 46 mm. There is mild dilatation of the ascending aorta,  measuring 40 mm.   7. Agitated saline contrast bubble study was positive with shunting  observed within 3-6 cardiac cycles suggestive of interatrial shunt.   R/LHC 05/2021: Prox RCA lesion is 20% stenosed. Prox Cx to Mid Cx lesion is 20% stenosed. Mid LAD lesion is 30% stenosed.   Mild non-obstructive CAD Normal right and left heart pressures   Recommendations: Medical management of mild CAD. Will resume IV heparin 6 hours post sheath pull given acute PE. Would transition to Eliquis or Xarelto tomorrow.   Patient Profile     65 y.o. male with PMH chronic systolic and diastolic heart failure, HOCM, VT s/p ICD, nonobstructive CAD, PVCs s/p ablation, paroxysmal atrial fibrillation, PFO, hypertension, hyperlipidemia, history of PE 05/2021, OSA on CPAP, rheumatoid arthritis who presented with shortness of breath and found to have GI bleed.  Assessment & Plan    Shortness of breath Presentation with Acute hypoxic respiratory failure OSA on CPAP (though reports being unable to tolerate) Chronic systolic and diastolic heart failure with recovered EF PFO -appears euvolemic today -weaned off O2, now on room air with sats in the mid-90s at rest, but noted to drop with exertion -admission weight 102.9 kg, current weight 107.6 kg, but noted as net negative 1.5 L. Suspect I/O inaccurate. He was hypovolemic on admission, so would not aim for admission weight as  goal  Paroxysmal atrial fibrillation History of recent pulmonary embolism Gastric ulcer, GI bleeding Nonobstructive CAD -chadsvasc=6, recent PE. Both require anticoagulation. Was on apixaban 5 mg BID prior to admission -EGD with gastric ulcer and gastritis. Hgb has now stabilized -given ulcer, reasonable to hold aspirin until he completes ulcer treatment, as his CAD is nonobstructive -with increase in metoprolol to 50 mg BID, now back to predominant A pacing, suspect due to reverse use dependence at lower HR with sotalol  HOCM History of VT s/p ICD Frequent PVCs s/p ablation, with recurrence of PVCs -continue sotalol and metoprolol as above  Hypertension -home regimen is hydralazine 100 mg TID, spironolactone 25 mg daily, furosemide 40 mg daily, losartan 100 mg daily, chlorthalidone 25 mg daily -allergy to amlodipine -restarted spironolactone, tolerating -would restart losartan when BP allows, likely as an outpatient. -would not use both furosemide and chlorthalidone at discharge. Will start daily oral lasix today and can be ordered as needed for weight gain/swelling at discharge (would take potassium when he takes lasix)  Hyperlipidemia -last lipids 6.2022 reviewed, LDL 61 -continue atorvastatin 40 mg daily  Acute kidney injury, resolved -Cr 1.45 today, baseline appears to be 1.1-1.2, peak 1.72 this admission -did receive one dose of IV lasix yesterday PM -restarted spironolactone -restart losartan when BP allows, likely as an outpatient  Hypokalemia: -K 3.5 today, aim for >4 given arrhythmia history. I will supplement today -takes spironolactone, losartan, K supplement at home. Restarted spironolactone. Would stop home chlorthalidone, will likely not need K supplement unless he requires lasix  [facility] will sign off.   Medication Recommendations:    CONTINUE Apixaban 5 mg twice daily Atorvastatin 40  mg daily Sotalol 120 mg every 12 hours Spironolactone 25 mg  daily  CHANGE Furosemide 40 mg daily as needed for weight gain/swelling Take 40 meQ potassium when you take the furosemide (do not take on days with no furosemide)  HOLD Aspirin 81 mg daily (while being treated for ulcers) Hydralazine 100 mg every 8 hours (until blood pressure rises/follow up visit) Losartan 100 mg daily (until seen for follow up)  START Metoprolol tartrate 50 mg BID  STOP Chlorthalidone 25 mg  Other recommendations (labs, testing, etc):  follow up BMET at outpatient Follow up as an outpatient:  He had an appt with Dr. Mayford Knife scheduled today. We will reschedule this for him.   For questions or updates, please contact [facility] Please consult [email or web address] for contact info under        Signed, Jodelle Red, MD  10/11/2021, 9:29 AM

## 2021-10-11 NOTE — Telephone Encounter (Signed)
Patient contacted via telephone stated still having some cough.  Hoping to go home today waiting for doctor to come back for re-evaluation.  Walked better today with therapist and oxygen lowest 89% RA and with 2L oxygen [place].  Labs stable.  Patient contacted [place] and paperwork being faxed to hospital for completion.  Dr Mayford Knife appt cardiology rescheduled to 10/26/21 patient notified per Epic.  Dr Cristal Deer adjusted his medications   Paroxysmal atrial fibrillation History of recent pulmonary embolism Gastric ulcer, GI bleeding Nonobstructive CAD -chadsvasc=6, recent PE. Both require anticoagulation. Was on apixaban 5 mg BID prior to admission -EGD with gastric ulcer and gastritis. Hgb has now stabilized -given ulcer, reasonable to hold aspirin until he completes ulcer treatment, as his CAD is nonobstructive -with increase in metoprolol to 50 mg BID, now back to predominant A pacing, suspect due to reverse use dependence at lower HR with sotalol   HOCM History of VT s/p ICD Frequent PVCs s/p ablation, with recurrence of PVCs -continue sotalol and metoprolol as above  Hypertension -home regimen is hydralazine 100 mg TID, spironolactone 25 mg daily, furosemide 40 mg daily, losartan 100 mg daily, chlorthalidone 25 mg daily -allergy to amlodipine -restarted spironolactone, tolerating -would restart losartan when BP allows, likely as an outpatient. -would not use both furosemide and chlorthalidone at discharge. Will start daily oral lasix today and can be ordered as needed for weight gain/swelling at discharge (would take potassium when he takes lasix)   Hyperlipidemia -last lipids 6.2022 reviewed, LDL 61 -continue atorvastatin 40 mg daily   Acute kidney injury, resolved -Cr 1.45 today, baseline appears to be 1.1-1.2, peak 1.72 this admission -did receive one dose of IV lasix yesterday PM -restarted spironolactone -restart losartan when BP allows, likely as an outpatient    Hypokalemia: -K 3.5 today, aim for >4 given arrhythmia history. I will supplement today -takes spironolactone, losartan, K supplement at home. Restarted spironolactone. Would stop home chlorthalidone, will likely not need K supplement unless he requires lasix  Patient A&Ox3 spoke full sentences without difficulty duration of call 5 minutes.  Mild nonproductive cough noted during call.  No throat clearing or nasal congestion audible.  Discussed with patient will follow up with him via telephone again tomorrow.  Patient verbalized understanding information/instructions, agreed with plan of care and had no further questions at this time.  HR notified discharge anticipated in next 24 hours.

## 2021-10-12 LAB — CULTURE, BLOOD (ROUTINE X 2)
Culture: NO GROWTH
Culture: NO GROWTH
Special Requests: ADEQUATE
Special Requests: ADEQUATE

## 2021-10-12 LAB — BASIC METABOLIC PANEL
Anion gap: 7 (ref 5–15)
BUN: 22 mg/dL (ref 8–23)
CO2: 23 mmol/L (ref 22–32)
Calcium: 8.1 mg/dL — ABNORMAL LOW (ref 8.9–10.3)
Chloride: 106 mmol/L (ref 98–111)
Creatinine, Ser: 1.32 mg/dL — ABNORMAL HIGH (ref 0.61–1.24)
GFR, Estimated: 60 mL/min — ABNORMAL LOW (ref >60)
Glucose, Bld: 91 mg/dL (ref 70–99)
Potassium: 3.2 mmol/L — ABNORMAL LOW (ref 3.5–5.1)
Sodium: 136 mmol/L (ref 135–145)

## 2021-10-12 LAB — CBC
HCT: 27.1 % — ABNORMAL LOW (ref 39.0–52.0)
Hemoglobin: 9.2 g/dL — ABNORMAL LOW (ref 13.0–17.0)
MCH: 33.7 pg (ref 26.0–34.0)
MCHC: 33.9 g/dL (ref 30.0–36.0)
MCV: 99.3 fL (ref 80.0–100.0)
Platelets: 146 10*3/uL — ABNORMAL LOW (ref 150–400)
RBC: 2.73 MIL/uL — ABNORMAL LOW (ref 4.22–5.81)
RDW: 14.6 % (ref 11.5–15.5)
WBC: 4 10*3/uL (ref 4.0–10.5)
nRBC: 0 % (ref 0.0–0.2)

## 2021-10-12 LAB — MAGNESIUM: Magnesium: 2 mg/dL (ref 1.7–2.4)

## 2021-10-12 MED ORDER — CEFDINIR 300 MG PO CAPS: 300.0000 mg | ORAL_CAPSULE | Freq: Two times a day (BID) | ORAL | 0 refills | Status: DC

## 2021-10-12 MED ORDER — GUAIFENESIN ER 600 MG PO TB12: 600.0000 mg | ORAL_TABLET | Freq: Two times a day (BID) | ORAL | 0 refills | Status: AC

## 2021-10-12 MED ORDER — SUCRALFATE 1 GM/10ML PO SUSP: 1.0000 g | Freq: Three times a day (TID) | ORAL | 0 refills | Status: DC

## 2021-10-12 MED ORDER — PANTOPRAZOLE SODIUM 40 MG PO TBEC: 40.0000 mg | DELAYED_RELEASE_TABLET | Freq: Two times a day (BID) | ORAL | 1 refills | Status: DC

## 2021-10-12 MED ORDER — POTASSIUM CHLORIDE CRYS ER 20 MEQ PO TBCR
40.0000 meq | EXTENDED_RELEASE_TABLET | Freq: Once | ORAL | Status: AC
  Administered 2021-10-12: 40 meq via ORAL
  Filled 2021-10-12: qty 2

## 2021-10-12 MED ORDER — METOPROLOL TARTRATE 50 MG PO TABS: 50.0000 mg | ORAL_TABLET | Freq: Two times a day (BID) | ORAL | 1 refills | Status: DC

## 2021-10-12 MED ORDER — FUROSEMIDE 40 MG PO TABS: 40.0000 mg | ORAL_TABLET | Freq: Every day | ORAL | 3 refills | Status: DC | PRN

## 2021-10-12 NOTE — TOC Initial Note (Signed)
Transition of Care [facility]) - Initial/Assessment Note    Patient Details  Name: Ryan Glass MRN: [id number] Date of Birth: [birth year]  Transition of Care [facility]) CM/SW Contact:    Bethena Roys, RN Phone Number: 10/12/2021, 1:11 PM  Clinical Narrative: Risk for readmission assessment completed. Patient is from home with family support. States he gets to appointments without any issues. Patient is agreeable to outpatient PT at the [facility]. Ambulatory referral submitted via Epic. Office to call the family with scheduled times. No further needs identified at this time.                  Expected Discharge Plan: Home/Self Care Barriers to Discharge: No Barriers Identified   Patient Goals and CMS Choice Patient states their goals for this hospitalization and ongoing recovery are:: to return home-plan for dc home with outpatient PT   Choice offered to / list presented to : NA  Expected Discharge Plan and Services Expected Discharge Plan: Home/Self Care In-house Referral: NA Discharge Planning Services: CM Consult Post Acute Care Choice: NA                   DME Arranged: N/A DME Agency: NA     Prior Living Arrangements/Services   Lives with:: Spouse          Need for Family Participation in Patient Care: Yes (Comment) Care giver support system in place?: Yes (comment)      Activities of Daily Living Home Assistive Devices/Equipment: None ADL Screening (condition at time of admission) Patient's cognitive ability adequate to safely complete daily activities?: Yes Is the patient deaf or have difficulty hearing?: No Does the patient have difficulty seeing, even when wearing glasses/contacts?: No Does the patient have difficulty concentrating, remembering, or making decisions?: No Patient able to express need for assistance with ADLs?: No Does the patient have difficulty dressing or bathing?: No Independently performs ADLs?: Yes (appropriate for  developmental age) Does the patient have difficulty walking or climbing stairs?: No Weakness of Legs: None Weakness of Arms/Hands: None  Permission Sought/Granted Permission sought to share information with : Case Manager Permission granted to share information with : Yes, Verbal Permission Granted     Permission granted to share info w AGENCY: [facility]        Emotional Assessment Appearance:: Appears stated age Attitude/Demeanor/Rapport: Engaged Affect (typically observed): Appropriate Orientation: : Oriented to Self, Oriented to Situation, Oriented to Place, Oriented to  Time Alcohol / Substance Use: Not Applicable Psych Involvement: No (comment)  Admission diagnosis:  Acute respiratory failure with hypoxia (HCC) [J96.01] Gastrointestinal hemorrhage, unspecified gastrointestinal hemorrhage type [K92.2] Dyspnea, unspecified type [R06.00] Patient Active Problem List   Diagnosis Date Noted   Dyspnea    Gastrointestinal hemorrhage    Acute respiratory failure with hypoxia ([place]) 10/07/2021   ABLA (acute blood loss anemia) 10/07/2021   History of pulmonary embolus (PE) 10/07/2021   AF (paroxysmal atrial fibrillation) ([place]) 10/07/2021   VT (ventricular tachycardia) 09/08/2021   HOCM (hypertrophic obstructive cardiomyopathy) ([place]) 08/15/2021   Ventricular tachycardia 08/02/2021   Acute on chronic respiratory failure ([place]) 06/07/2021   History of COVID-19 06/07/2021   Rheumatoid arthritis involving multiple sites on Humira ([place]) 06/07/2021   On chronic prednisone therapy for Rheumatoid Arthritis 06/07/2021   CAD (coronary artery disease), non-obstructive 05/30/2021   Elevated troponin    Precordial chest pain    Heart failure with mid-range ejection fraction ([place]) 05/27/2021   LVH (left ventricular hypertrophy)  due to hypertensive disease, with heart failure (HCC) 05/27/2021   Class 2 severe obesity with serious comorbidity and body mass index (BMI) of 35.0 to  35.9 in adult [facility]) 05/26/2021   Pulmonary embolism (HCC) 05/25/2021   Dyspnea on exertion    Thoracic aortic aneurysm without rupture 02/01/2021   Ascending aortic aneurysm 02/01/2021   Other hyperlipidemia 10/14/2020   Testicular swelling 05/14/2019   Pain in right knee 01/29/2019   PVC's (premature ventricular contractions)    PVC (premature ventricular contraction) 10/07/2018   Dilated aortic root (HCC)    Allergic rhinitis 01/10/2016   Chronic gout without tophus 01/10/2016   Drug therapy 01/10/2016   OSA (obstructive sleep apnea) 01/10/2016   Colonic polyp 12/14/2015   Rt groin pain 12/14/2015   Scoliosis of thoracolumbar spine 12/14/2015   Right lower quadrant abdominal pain 04/20/2015   Arthritis of knee 02/22/2015   Edema of extremities 02/22/2015   Essential hypertension 02/22/2015   Preventative health care 02/22/2015   Bradycardia by electrocardiogram 04/16/2014   Chest pain 04/16/2014   PCP:  [name], MD Pharmacy:   [facility] [id number] - [place], [place] - [address] [place] [address] Phone: [phone] Fax: [phone]  [facility] ([facility]) - [place], [place] - [address] [place] [address] Phone: [phone] Fax: [phone]   Readmission Risk Interventions Readmission Risk Prevention Plan 10/12/2021  Transportation Screening Complete  Medication Review (RN Care Manager) Complete  PCP or Specialist appointment within 3-5 days of discharge Complete  HRI or Home Care Consult Complete  SW Recovery Care/Counseling Consult Complete  Palliative Care Screening Not Applicable  Skilled Nursing Facility Not Applicable  Some recent data might be hidden

## 2021-10-12 NOTE — Telephone Encounter (Signed)
Patient to be discharged today. PCM visit and follow up labs recommended along with restarting BP meds once pressure back to baseline. Noted patient ambulated in hallway much further on RA today sp02 90s entire time.

## 2021-10-12 NOTE — Discharge Summary (Signed)
Physician Discharge Summary  Ryan Glass [id number] DOB: [birth year] DOA: 10/07/2021  PCP: Ryan Peers, MD  Admit date: 10/07/2021 Discharge date: 10/12/2021  Admitted From: home Disposition:  home  Recommendations for Outpatient Follow-up:  Follow up with PCP in 1-2 weeks Please obtain BMP/CBC in one week Please follow up hypertension.  Restart losartan if/when BP will tolerate.    Home Health: None  Equipment/Devices: none   Discharge Condition: Stable  CODE STATUS: Full  Diet recommendation: Heart Healthy     Discharge Diagnoses: Active Problems:   Essential hypertension   OSA (obstructive sleep apnea)   Class 2 severe obesity with serious comorbidity and body mass index (BMI) of 35.0 to 35.9 in adult [facility])   HOCM (hypertrophic obstructive cardiomyopathy) (HCC)   Acute respiratory failure with hypoxia (HCC)   ABLA (acute blood loss anemia)   History of pulmonary embolus (PE)   AF (paroxysmal atrial fibrillation) (HCC)   Dyspnea   Gastrointestinal hemorrhage    Summary of HPI and Hospital Course:  "65 y.o. male with medical history significant of combined systolic and diastolic CHF, HOCM, VT s/p ICD, CAD, PAF, HTN, HLD, COPD, PE in 05/2021 on LE, OSA on CPAP who presented with complaints of shortness of breath.  Symptoms are suggestive of orthopnea and PND.  Denies having any ICD shocks to his knowledge.  Also had a productive cough with greenish sputum.  Not on oxygen at home.  Also mentioned blood in stool recently.   Upon EMS arrival patient was noted to have O2 saturations of 78% on room air.  Chest x-ray showed concern for atelectasis versus infiltrate at the bases.  Patient was given antibiotics.  Was hospitalized for further management.  Initially placed on IV heparin but then developed GI bleed so this was discontinued and reversed with protamine.  Also noted to be in atrial fibrillation with RVR.  Discussions were held with the cardiology and patient was  given amiodarone.    Underwent upper endoscopy which showed gastric ulcer and gastritis.  Eliquis was 10-10-2021. Hgb stable over last 24 hours."      Acute respiratory failure with hypoxia Community-acquired pneumonia Chest x-ray suggested infiltrates bilaterally.   No evidence for aspiration.   Influenza and COVID test were negative.   Initially required BiPAP.   Transitioned to nasal cannula oxygen and weaned to room air. Treated with ceftriaxone >> Omnicef. WBC remains normal.   Procalcitonin was 0.93.   Low suspicion for PE since patient had been compliant with Eliquis. Repeat CXR showed some improvement in the lung findings. No pulmonary edema noted. Patient maintains oxygen sats at rest and with ambulation, does not qualify for home oxygen at this time.   Discharge Plan:  2 more days Omnicef to complete 7 day course.   Lasix daily PRN wt gain.   Off chlorthalidone. Stopped losartan at d/c - to resume as outpatient if BP will tolerated.    Upper GI bleed secondary to gastritis/gastric ulcer Patient seen by gastroenterology and underwent EGD on 10/30 which showed evidence for gastric ulcer along with gastritis with hemorrhage.  Thought to be secondary to NSAIDs.   Continue PPI.   H. pylori titers have been negative. Eliquis restarted on 10-10-2021.  Hbg stable.   Acute blood loss anemia Due to GI bleed. Has not required transfusion.  Hemoglobin stable. Mgmt as above.   Acute kidney injury/hypokalemia Creatinine was noted to be 1.72 on 10/30.   Treated with IV fluids due to concern for  hypovolemia from GI bleed.  Improved.  IV fluids have been discontinued.   Chlorthalidone was stopped and potassium replaced. Monitor BMP in follow up.   Paroxysmal atrial fibrillation with RVR Noted to be in RVR at the time of admission.   Cardiology was consulted.   Sotalol was continued.   Metoprolol was added by cardiology.  Beta-blocker dose was increased for better HR  control. Eliquis was resumed 10-10-2021.   Cardiology has changed meds to include lopressor and sotalol. Pt is off cardizem now with HR's stable.   History of ventricular tachycardia Recently required admission in August for VT/new onset A. fib.  Underwent ICD placement.  Monitored on telemetry.   History of pulmonary embolism This was in June 2022.  Looks like a small PE was detected.  No DVT was noted.  Even though Eliquis was resumed last night it appears that patient refused to take his dose last night.  Eliquis restarted on 10-10-2021. No further bleeding since then.   Chronic diastolic CHF/HOCM Fairly euvolemic currently.   Takes chlorthalidone, spironolactone at home - held due to renal failure.    Cardiology stopped chlorthalidone.  Changed lasix to 40 mg daily prn Continue with aldactone 25 mg daily.   Cardiology f/u appointment with dr. [name] on 10-26-2021. Family aware of 10-26-2021 appointment.  Prolonged QT interval Monitor on telemetry.   History of PFO Per cardiology  Essential hypertension Blood pressure is reasonably well controlled.   Hyperlipidemia Continue statin.  Obstructive sleep apnea CPAP  Obesity Estimated body mass index is 0000000  Complicates overall care and prognosis.  Recommend lifestyle modifications including physical activity and diet for weight loss and overall long-term health.    Discharge Instructions   Discharge Instructions     (HEART FAILURE PATIENTS) Call MD:  Anytime you have any of the following symptoms: 1) 3 pound weight gain in 24 hours or 5 pounds in 1 week 2) shortness of breath, with or without a dry hacking cough 3) swelling in the hands, feet or stomach 4) if you have to sleep on extra pillows at night in order to breathe.   Complete by: As directed    Ambulatory referral to Physical Therapy   Complete by: As directed    Outpatient Physical Therapy-Evaluation and Treatment-   Call MD for:  extreme fatigue   Complete  by: As directed    Call MD for:  persistant dizziness or light-headedness   Complete by: As directed    Call MD for:  persistant nausea and vomiting   Complete by: As directed    Call MD for:  severe uncontrolled pain   Complete by: As directed    Call MD for:  temperature >100.4   Complete by: As directed    Diet - low sodium heart healthy   Complete by: As directed    Discharge instructions   Complete by: As directed    Please take cefdinir (aka Omnicef) - antibiotic for 2 more days for pneumonia.    Please take all medications as prescribed.   Use Lasix (furosemide) daily AS NEEDED for weight gain, as outlined below. We stopped chlorthalidone. Also stopped losartan for now to prevent dropping your BP too low.   Losartan might need to be restarted when you follow up with your outpatient doctors.   Increase activity slowly   Complete by: As directed       Allergies as of 10/12/2021       Reactions   Amlodipine Besylate Swelling,  Other (See Comments)   Leg swelling with 10 mg daily am dosing; decreased with BID 5 mg dosing   Aspirin Itching        Medication List     STOP taking these medications    aspirin EC 81 MG tablet   chlorthalidone 25 MG tablet Commonly known as: HYGROTON   fluticasone-salmeterol 100-50 MCG/ACT Aepb Commonly known as: Advair Diskus   losartan 100 MG tablet Commonly known as: COZAAR       TAKE these medications    Acetaminophen-Codeine 300-30 MG tablet Take 1-2 tablets by mouth every 12 (twelve) hours as needed for pain.   albuterol 108 (90 Base) MCG/ACT inhaler Commonly known as: VENTOLIN HFA Inhale 2 puffs into the lungs every 4 (four) hours as needed for wheezing or shortness of breath.   apixaban 5 MG Tabs tablet Commonly known as: ELIQUIS Take 1 tablet (5 mg total) by mouth 2 (two) times daily.   atorvastatin 40 MG tablet Commonly known as: LIPITOR Take 1 tablet (40 mg total) by mouth daily. What changed: when to take  this   cefdinir 300 MG capsule Commonly known as: OMNICEF Take 1 capsule (300 mg total) by mouth every 12 (twelve) hours for 2 days.   colchicine 0.6 MG tablet Take 0.6-1.2 mg by mouth daily as needed (for gout flares).   fluticasone 50 MCG/ACT nasal spray Commonly known as: FLONASE Place 1 spray into both nostrils daily. What changed:  how much to take when to take this   folic acid 1 MG tablet Commonly known as: FOLVITE Take 1 mg by mouth daily.   furosemide 40 MG tablet Commonly known as: LASIX Take 1 tablet (40 mg total) by mouth daily as needed (weight gain). Take 1 tablet by mouth daily for 3 days, then decrease to only as needed for increase weight gain What changed:  when to take this reasons to take this   gabapentin 600 MG tablet Commonly known as: NEURONTIN Take 600 mg by mouth with breakfast, with lunch, and with evening meal.   guaiFENesin 600 MG 12 hr tablet Commonly known as: MUCINEX Take 1 tablet (600 mg total) by mouth 2 (two) times daily for 7 days.   Humira Pen 40 MG/0.4ML Pnkt Generic drug: Adalimumab Inject 40 mg into the skin every 14 (fourteen) days.   hydrALAZINE 100 MG tablet Commonly known as: APRESOLINE Take 1 tablet (100 mg total) by mouth every 8 (eight) hours.   Magnesium Oxide 400 MG Caps Take 1 capsule (400 mg total) by mouth daily.   methotrexate 2.5 MG tablet Take 15 mg by mouth every Sunday.   metoprolol tartrate 50 MG tablet Commonly known as: LOPRESSOR Take 1 tablet (50 mg total) by mouth 2 (two) times daily.   montelukast 10 MG tablet Commonly known as: SINGULAIR Take 1 tablet (10 mg total) by mouth at bedtime.   pantoprazole 40 MG tablet Commonly known as: PROTONIX Take 1 tablet (40 mg total) by mouth 2 (two) times daily.   potassium chloride SA 20 MEQ tablet Commonly known as: KLOR-CON Take 2 tablets (40 mEq total) by mouth 2 (two) times daily.   predniSONE 5 MG tablet Commonly known as: DELTASONE Take 5 mg by  mouth daily with breakfast.   sildenafil 20 MG tablet Commonly known as: REVATIO Take 20 mg by mouth daily as needed (erectile dysfunction).   sotalol 120 MG tablet Commonly known as: BETAPACE Take 1 tablet (120 mg total) by mouth every 12 (twelve) hours.  spironolactone 25 MG tablet Commonly known as: ALDACTONE Take 1 tablet (25 mg total) by mouth daily.   sucralfate 1 GM/10ML suspension Commonly known as: CARAFATE Take 10 mLs (1 g total) by mouth 4 (four) times daily -  with meals and at bedtime.        Follow-up Information     Sueanne Margarita, MD Follow up.   Specialty: Cardiology Why: Hospital follow-up with Cardiology scheduled for 10/26/2021 at 1:40pm. Please arrive 15 minutes early for check-in. If this date/time does not work for you, please call our office to reschedule. Contact information: [id number] [place] [address] [place] Follow up.   Specialty: Rehabilitation Why: Office to call the patient with visit times. If the office has not called within 3 business days, please call the office. Contact information: [place]. [id number] [employer] [id number] [phone]               Allergies  Allergen Reactions   Amlodipine Besylate Swelling and Other (See Comments)    Leg swelling with 10 mg daily am dosing; decreased with BID 5 mg dosing   Aspirin Itching     If you experience worsening of your admission symptoms, develop shortness of breath, life threatening emergency, suicidal or homicidal thoughts you must seek medical attention immediately by calling 911 or calling your MD immediately  if symptoms less severe.    Please note   You were cared for by a hospitalist during your hospital stay. If you have any questions about your discharge medications or the care you received while you were in the hospital after you are discharged, you  can call the unit and asked to speak with the hospitalist on call if the hospitalist that took care of you is not available. Once you are discharged, your primary care physician will handle any further medical issues. Please note that NO REFILLS for any discharge medications will be authorized once you are discharged, as it is imperative that you return to your primary care physician (or establish a relationship with a primary care physician if you do not have one) for your aftercare needs so that they can reassess your need for medications and monitor your lab values.   Consultations: Cardiology    Procedures/Studies: DG Chest Port 1 View  Result Date: 10/09/2021 CLINICAL DATA:  Pneumonia EXAM: PORTABLE CHEST 1 VIEW COMPARISON:  Previous studies including the examination of 10/07/2021 FINDINGS: Transverse diameter of heart is increased. Pacemaker/defibrillator battery is seen in the left infraclavicular region. There are no signs of pulmonary edema. There is interval decrease in infiltrates in both lower lung fields suggesting resolving pneumonia/atelectasis. There is no significant pleural effusion or pneumothorax. IMPRESSION: There is interval improvement in aeration of both lower lung fields suggesting resolving atelectasis/pneumonitis. There are no new infiltrates or signs of pulmonary edema. Electronically Signed   By: Elmer Picker M.D.   On: 10/09/2021 08:57   DG Chest Port 1 View  Result Date: 10/07/2021 CLINICAL DATA:  Shortness of breath EXAM: PORTABLE CHEST 1 VIEW COMPARISON:  09/08/2021 FINDINGS: Chronic cardiopericardial enlargement. Aortic tortuosity. Low volume chest with indistinct density at the bases. Dual-chamber pacer leads from the left in unremarkable position. Artifact from EKG leads. IMPRESSION: Low volume chest with atelectasis or infiltrate at the bases. Chronic cardiomegaly.  No edema. Electronically Signed   By: Jorje Guild M.D.   On:  10/07/2021 11:20   CUP  PACEART INCLINIC DEVICE CHECK  Result Date: 09/19/2021 ICD check in clinic. Normal device function. Thresholds and sensing consistent with previous device measurements. Impedance trends stable over time. No mode switches. No ventricular arrhythmias. Histogram distribution appropriate for patient and level of  activity. No changes made this session. Device programmed at acute safety margins. Device programmed to optimize intrinsic conduction. Estimated longevity 4 yr, 10 mo. Pt enrolled in remote follow-up. Patient education completed       Subjective: Pt feels well this AM.  No fever/chills, palpitations, chest pain, dizziness or lightheadedness, SOB or other complaints.  Feels ready to go home.   Discharge Exam: Vitals:   10/12/21 0811 10/12/21 1025  BP: 103/74 104/74  Pulse: 81 78  Resp: 19 20  Temp: 98.3 F (36.8 C) 97.9 F (36.6 C)  SpO2: 95% 94%   Vitals:   10/12/21 0500 10/12/21 0556 10/12/21 0811 10/12/21 1025  BP:  118/87 103/74 104/74  Pulse:  79 81 78  Resp:   19 20  Temp:  (!) 97.4 F (36.3 C) 98.3 F (36.8 C) 97.9 F (36.6 C)  TempSrc:  Oral Oral Oral  SpO2:  96% 95% 94%  Weight: 106.9 kg     Height:        General: Pt is alert, awake, not in acute distress Cardiovascular: RRR, S1/S2 +, no rubs, no gallops Respiratory: CTA bilaterally, no wheezing, no rhonchi Abdominal: Soft, NT, ND, bowel sounds + Extremities: no edema, no cyanosis    The results of significant diagnostics from this hospitalization (including imaging, microbiology, ancillary and laboratory) are listed below for reference.     Microbiology: Recent Results (from the past 240 hour(s))  Culture, blood (routine x 2)     Status: None   Collection Time: 10/07/21 10:30 AM   Specimen: BLOOD  Result Value Ref Range Status   Specimen Description BLOOD RIGHT ANTECUBITAL  Final   Special Requests   Final    BOTTLES DRAWN AEROBIC AND ANAEROBIC Blood Culture adequate volume   Culture   Final     NO GROWTH 5 DAYS Performed at [facility], [address] [place], [place] [address]    Report Status 10/12/2021 FINAL  Final  Resp Panel by RT-PCR (Flu A&B, Covid) Nasopharyngeal Swab     Status: None   Collection Time: 10/07/21 10:32 AM   Specimen: Nasopharyngeal Swab; Nasopharyngeal(NP) swabs in vial transport medium  Result Value Ref Range Status   SARS Coronavirus 2 by RT PCR NEGATIVE NEGATIVE Final    Comment: (NOTE) SARS-CoV-2 target nucleic acids are NOT DETECTED.  The SARS-CoV-2 RNA is generally detectable in upper respiratory specimens during the acute phase of infection. The lowest concentration of SARS-CoV-2 viral copies this assay can detect is 138 copies/mL. A negative result does not preclude SARS-Cov-2 infection and should not be used as the sole basis for treatment or other patient management decisions. A negative result may occur with  improper specimen collection/handling, submission of specimen other than nasopharyngeal swab, presence of viral mutation(s) within the areas targeted by this assay, and inadequate number of viral copies(<138 copies/mL). A negative result must be combined with clinical observations, patient history, and epidemiological information. The expected result is Negative.  Fact Sheet for Patients:  EntrepreneurPulse.com.au  Fact Sheet for Healthcare Providers:  IncredibleEmployment.be  This test is no t yet approved or cleared by the [place] FDA and  has been authorized for detection and/or diagnosis of SARS-CoV-2  by FDA under an Emergency Use Authorization (EUA). This EUA will remain  in effect (meaning this test can be used) for the duration of the COVID-19 declaration under Section 564(b)(1) of the Act, 21 U.S.C.section 360bbb-3(b)(1), unless the authorization is terminated  or revoked sooner.       Influenza A by PCR NEGATIVE NEGATIVE Final   Influenza B by PCR NEGATIVE NEGATIVE  Final    Comment: (NOTE) The Xpert Xpress SARS-CoV-2/FLU/RSV plus assay is intended as an aid in the diagnosis of influenza from Nasopharyngeal swab specimens and should not be used as a sole basis for treatment. Nasal washings and aspirates are unacceptable for Xpert Xpress SARS-CoV-2/FLU/RSV testing.  Fact Sheet for Patients: EntrepreneurPulse.com.au  Fact Sheet for Healthcare Providers: IncredibleEmployment.be  This test is not yet approved or cleared by the [place] FDA and has been authorized for detection and/or diagnosis of SARS-CoV-2 by FDA under an Emergency Use Authorization (EUA). This EUA will remain in effect (meaning this test can be used) for the duration of the COVID-19 declaration under Section 564(b)(1) of the Act, 21 U.S.C. section 360bbb-3(b)(1), unless the authorization is terminated or revoked.  Performed at [facility], [place] [address] [place], [place] [address]   Culture, blood (routine x 2)     Status: None   Collection Time: 10/07/21  9:07 PM   Specimen: BLOOD RIGHT HAND  Result Value Ref Range Status   Specimen Description BLOOD RIGHT HAND  Final   Special Requests AEROBIC BOTTLE ONLY Blood Culture adequate volume  Final   Culture   Final    NO GROWTH 5 DAYS Performed at [facility], [place] [address], [place], [place] [address]    Report Status 10/12/2021 FINAL  Final     Labs: BNP (last 3 results) Recent Labs    08/02/21 2201 09/08/21 1400 10/07/21 1031  BNP 50.5 116.3* [id number]   Basic Metabolic Panel: Recent Labs  Lab 10/08/21 0318 10/09/21 0216 10/10/21 0251 10/11/21 0246 10/12/21 0257  NA 136 137 137 132* 136  K 2.9* 3.2* 3.3* 3.5 3.2*  CL 106 107 107 103 106  CO2 19* 21* 23 22 23   GLUCOSE 147* 105* 103* 95 91  BUN 56* 42* 27* 25* 22  CREATININE 1.72* 1.36* 1.23 1.45* 1.32*  CALCIUM 8.6* 8.4* 8.1* 7.9* 8.1*  MG  --   --  2.0  --  2.0   Liver Function Tests: Recent Labs   Lab 10/07/21 1031  AST 28  ALT 26  ALKPHOS 51  BILITOT 1.1  PROT 5.9*  ALBUMIN 3.6   No results for input(s): LIPASE, AMYLASE in the last 168 hours. No results for input(s): AMMONIA in the last 168 hours. CBC: Recent Labs  Lab 10/07/21 1031 10/07/21 1253 10/08/21 1337 10/09/21 0216 10/10/21 0251 10/10/21 1137 10/11/21 0246 10/12/21 0257  WBC 6.3   < > 7.2 5.5 3.6*  --  3.7* 4.0  NEUTROABS 4.9  --   --   --   --   --   --   --   HGB 11.7*   < > 9.6* 9.8* 8.8* 9.3* 9.2* 9.2*  HCT 36.0*   < > 28.6* 30.2* 26.7* 27.7* 26.6* 27.1*  MCV 106.2*   < > 102.1* 103.1* 101.5*  --  100.4* 99.3  PLT 185   < > 144* 139* 131*  --  126* 146*   < > = values in this interval not displayed.   Cardiac Enzymes: No results  for input(s): CKTOTAL, CKMB, CKMBINDEX, TROPONINI in the last 168 hours. BNP: Invalid input(s): POCBNP CBG: No results for input(s): GLUCAP in the last 168 hours. D-Dimer No results for input(s): DDIMER in the last 72 hours. Hgb A1c No results for input(s): HGBA1C in the last 72 hours. Lipid Profile No results for input(s): CHOL, HDL, LDLCALC, TRIG, CHOLHDL, LDLDIRECT in the last 72 hours. Thyroid function studies No results for input(s): TSH, T4TOTAL, T3FREE, THYROIDAB in the last 72 hours.  Invalid input(s): FREET3 Anemia work up No results for input(s): VITAMINB12, FOLATE, FERRITIN, TIBC, IRON, RETICCTPCT in the last 72 hours. Urinalysis    Component Value Date/Time   COLORURINE STRAW (A) 10/07/2021 1031   APPEARANCEUR CLEAR 10/07/2021 1031   LABSPEC 1.011 10/07/2021 1031   PHURINE 5.0 10/07/2021 1031   GLUCOSEU NEGATIVE 10/07/2021 1031   HGBUR NEGATIVE 10/07/2021 1031   BILIRUBINUR NEGATIVE 10/07/2021 1031   KETONESUR NEGATIVE 10/07/2021 1031   PROTEINUR NEGATIVE 10/07/2021 1031   NITRITE NEGATIVE 10/07/2021 1031   LEUKOCYTESUR NEGATIVE 10/07/2021 1031   Sepsis Labs Invalid input(s): PROCALCITONIN,  WBC,  LACTICIDVEN Microbiology Recent Results (from  the past 240 hour(s))  Culture, blood (routine x 2)     Status: None   Collection Time: 10/07/21 10:30 AM   Specimen: BLOOD  Result Value Ref Range Status   Specimen Description BLOOD RIGHT ANTECUBITAL  Final   Special Requests   Final    BOTTLES DRAWN AEROBIC AND ANAEROBIC Blood Culture adequate volume   Culture   Final    NO GROWTH 5 DAYS Performed at [facility], [address] [place], [place] [address]    Report Status 10/12/2021 FINAL  Final  Resp Panel by RT-PCR (Flu A&B, Covid) Nasopharyngeal Swab     Status: None   Collection Time: 10/07/21 10:32 AM   Specimen: Nasopharyngeal Swab; Nasopharyngeal(NP) swabs in vial transport medium  Result Value Ref Range Status   SARS Coronavirus 2 by RT PCR NEGATIVE NEGATIVE Final    Comment: (NOTE) SARS-CoV-2 target nucleic acids are NOT DETECTED.  The SARS-CoV-2 RNA is generally detectable in upper respiratory specimens during the acute phase of infection. The lowest concentration of SARS-CoV-2 viral copies this assay can detect is 138 copies/mL. A negative result does not preclude SARS-Cov-2 infection and should not be used as the sole basis for treatment or other patient management decisions. A negative result may occur with  improper specimen collection/handling, submission of specimen other than nasopharyngeal swab, presence of viral mutation(s) within the areas targeted by this assay, and inadequate number of viral copies(<138 copies/mL). A negative result must be combined with clinical observations, patient history, and epidemiological information. The expected result is Negative.  Fact Sheet for Patients:  BloggerCourse.com  Fact Sheet for Healthcare Providers:  SeriousBroker.it  This test is no t yet approved or cleared by the [place] FDA and  has been authorized for detection and/or diagnosis of SARS-CoV-2 by FDA under an Emergency Use Authorization (EUA).  This EUA will remain  in effect (meaning this test can be used) for the duration of the COVID-19 declaration under Section 564(b)(1) of the Act, 21 U.S.C.section 360bbb-3(b)(1), unless the authorization is terminated  or revoked sooner.       Influenza A by PCR NEGATIVE NEGATIVE Final   Influenza B by PCR NEGATIVE NEGATIVE Final    Comment: (NOTE) The Xpert Xpress SARS-CoV-2/FLU/RSV plus assay is intended as an aid in the diagnosis of influenza from Nasopharyngeal swab specimens and should not  be used as a sole basis for treatment. Nasal washings and aspirates are unacceptable for Xpert Xpress SARS-CoV-2/FLU/RSV testing.  Fact Sheet for Patients: EntrepreneurPulse.com.au  Fact Sheet for Healthcare Providers: IncredibleEmployment.be  This test is not yet approved or cleared by the [place] FDA and has been authorized for detection and/or diagnosis of SARS-CoV-2 by FDA under an Emergency Use Authorization (EUA). This EUA will remain in effect (meaning this test can be used) for the duration of the COVID-19 declaration under Section 564(b)(1) of the Act, 21 U.S.C. section 360bbb-3(b)(1), unless the authorization is terminated or revoked.  Performed at [facility], [place] [address], [place], [place] [address]   Culture, blood (routine x 2)     Status: None   Collection Time: 10/07/21  9:07 PM   Specimen: BLOOD RIGHT HAND  Result Value Ref Range Status   Specimen Description BLOOD RIGHT HAND  Final   Special Requests AEROBIC BOTTLE ONLY Blood Culture adequate volume  Final   Culture   Final    NO GROWTH 5 DAYS Performed at [facility], [place] [address] [place], [place] [address]    Report Status 10/12/2021 FINAL  Final     Time coordinating discharge: Over 30 minutes  SIGNED:   Ezekiel Slocumb, DO [facility] 10/12/2021, 1:32 PM   If 7PM-7AM, please contact night-coverage [email or web address]

## 2021-10-12 NOTE — Progress Notes (Signed)
Was told patient will call when ready for CPAP HS.

## 2021-10-12 NOTE — Progress Notes (Signed)
Mobility Specialist Progress Note    10/12/21 1614  Mobility  Activity Ambulated in hall  Level of Assistance Modified independent, requires aide device or extra time  Assistive Device Four wheel walker  Distance Ambulated (ft) 205 ft  Mobility Ambulated with assistance in hallway  Mobility Response Tolerated well  Mobility performed by Mobility specialist  $Mobility charge 1 Mobility   Pt received in bed and agreeable. Encouraged focus on breathing technique and maintained O2 sat of 91-96% during walk. Returned to bed with call bell in reach.   [place] Mobility Specialist  Mobility Specialist Phone: [phone]

## 2021-10-13 ENCOUNTER — Ambulatory Visit (INDEPENDENT_AMBULATORY_CARE_PROVIDER_SITE_OTHER): Payer: No Typology Code available for payment source | Admitting: Primary Care

## 2021-10-13 ENCOUNTER — Other Ambulatory Visit: Payer: Self-pay

## 2021-10-13 ENCOUNTER — Ambulatory Visit (INDEPENDENT_AMBULATORY_CARE_PROVIDER_SITE_OTHER): Payer: No Typology Code available for payment source

## 2021-10-13 ENCOUNTER — Telehealth: Payer: Self-pay | Admitting: Pulmonary Disease

## 2021-10-13 ENCOUNTER — Encounter: Payer: Self-pay | Admitting: Primary Care

## 2021-10-13 VITALS — BP 112/82 | HR 80 | Temp 98.6°F | Ht 70.0 in | Wt 237.8 lb

## 2021-10-13 DIAGNOSIS — J189 Pneumonia, unspecified organism: Secondary | ICD-10-CM

## 2021-10-13 DIAGNOSIS — J9611 Chronic respiratory failure with hypoxia: Secondary | ICD-10-CM

## 2021-10-13 IMAGING — DX DG CHEST 2V
2 series · 2 of 2 positions shown · non-contrast
Comparison: [DATE]

CLINICAL DATA: Community-acquired pneumonia

EXAM:
CHEST - 2 VIEW

[chest pa]
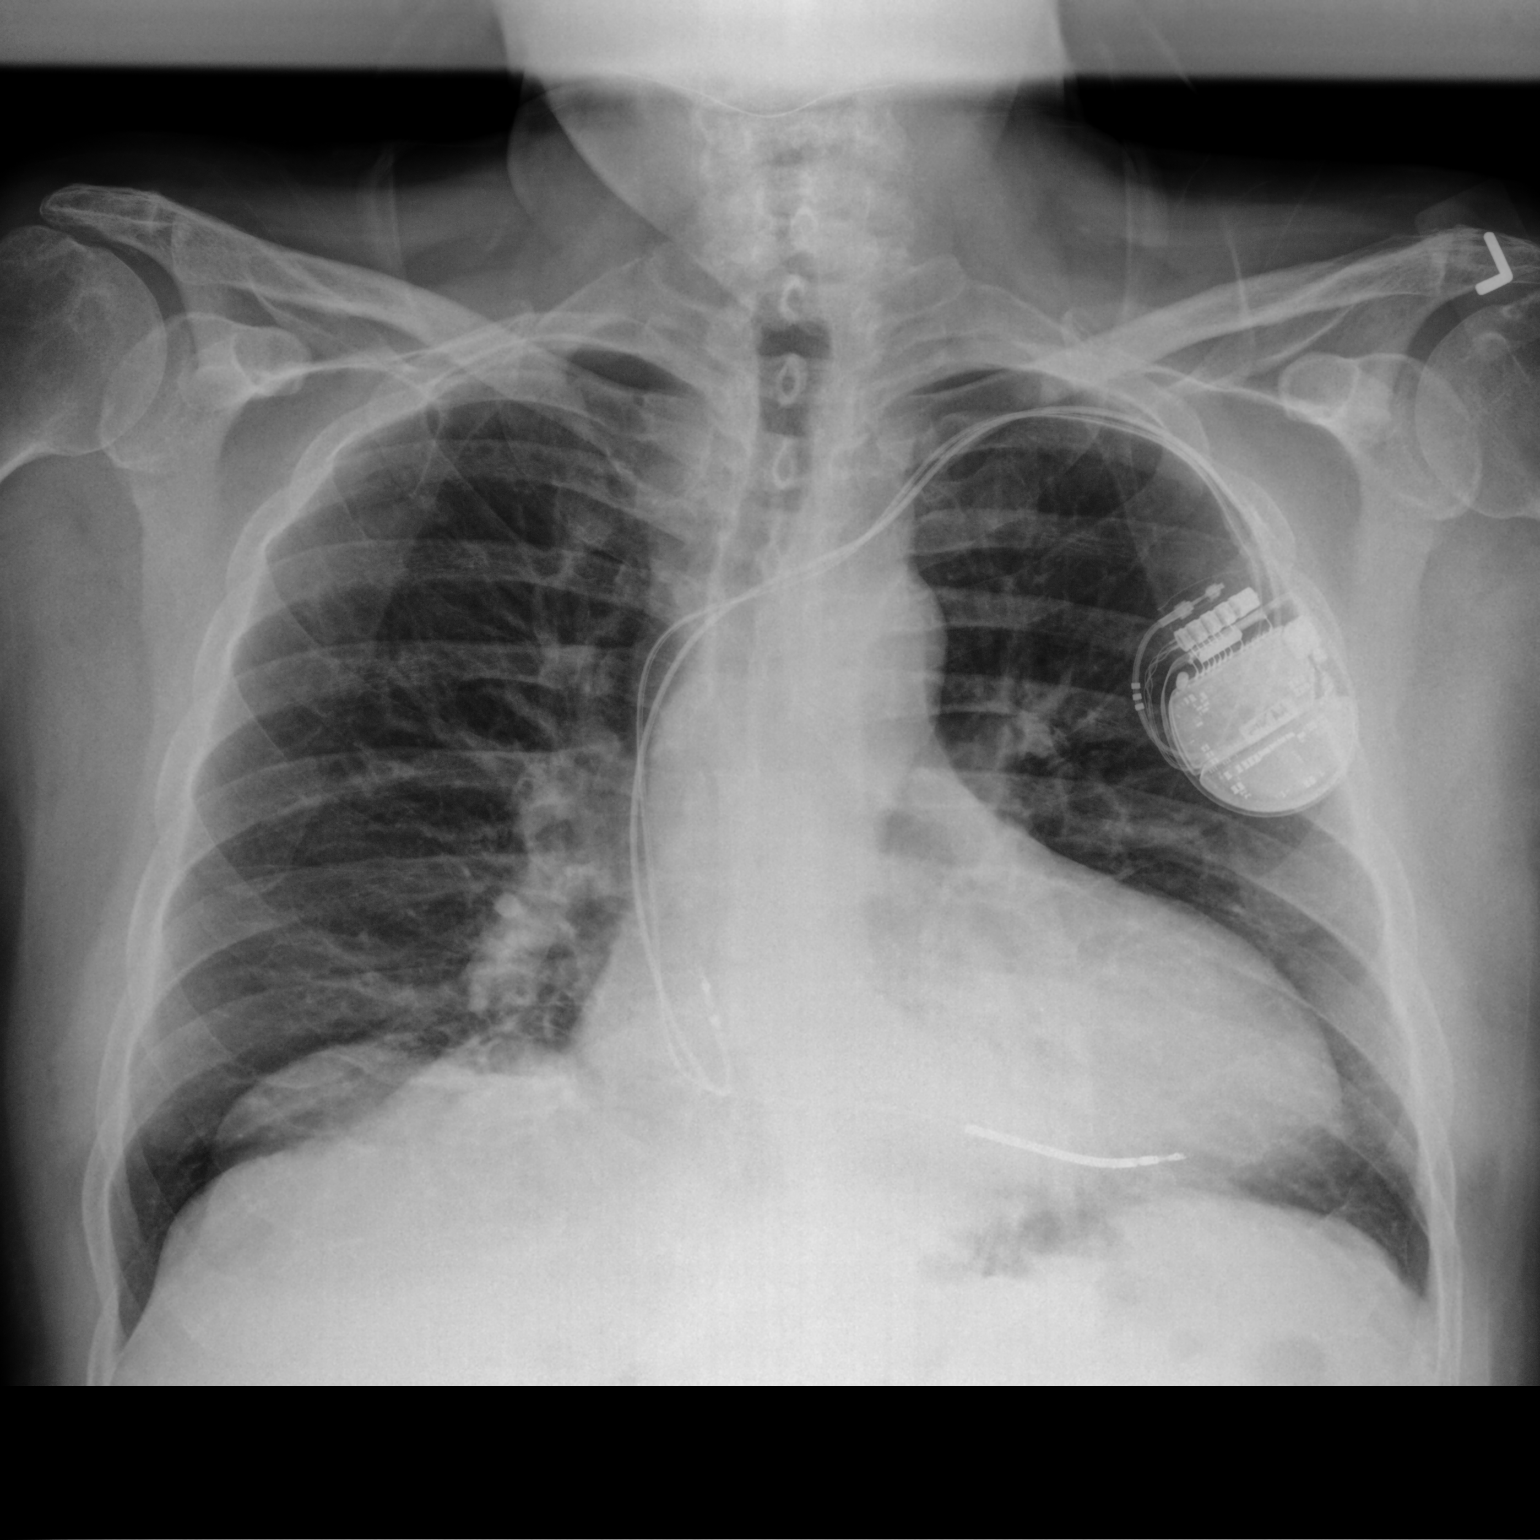

[chest lat]
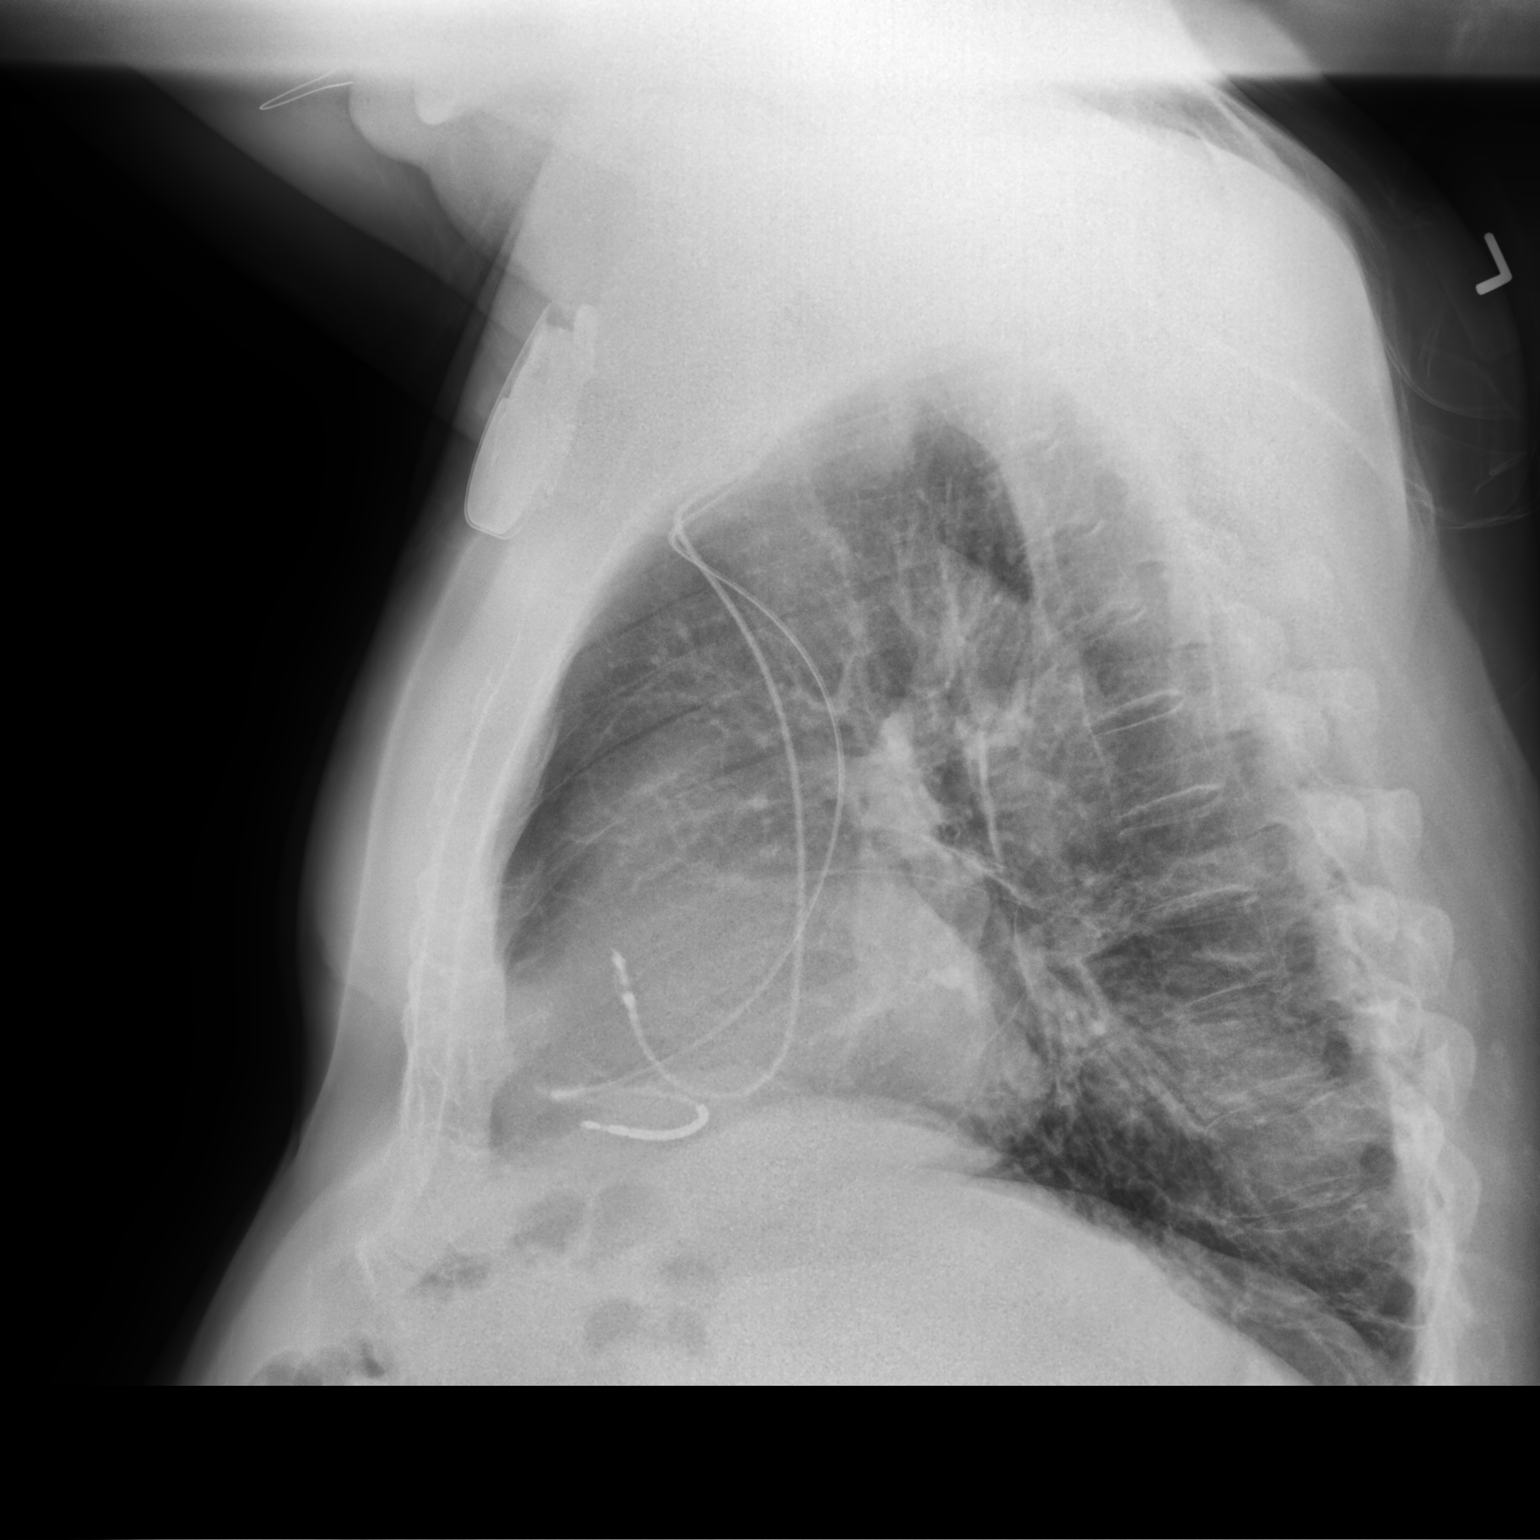

[2 of 2 positions shown; findings below may reference images not displayed]

FINDINGS: Dual lead AICD noted.  Mild cardiomegaly.  No edema.

Mild medial retrocardiac density persists and is nonspecific. The
right lung appears clear. No significant bony abnormality.
IMPRESSION: 1. Roughly similar appearance to the prior exam. There is a small
amount of residual medial left basilar opacity on the frontal
projection which is poorly corroborated on the lateral projection,
but could be due to atelectasis, residual pneumonia, or localized
pleural fluid.
2. Mild cardiomegaly.

## 2021-10-13 MED ORDER — PREDNISONE 10 MG PO TABS: ORAL_TABLET | ORAL | 0 refills | Status: DC

## 2021-10-13 MED ORDER — CEFDINIR 300 MG PO CAPS: ORAL_CAPSULE | ORAL | 0 refills | Status: DC

## 2021-10-13 MED ORDER — PROMETHAZINE-DM 6.25-15 MG/5ML PO SYRP: 5.0000 mL | ORAL_SOLUTION | Freq: Four times a day (QID) | ORAL | 0 refills | Status: DC | PRN

## 2021-10-13 NOTE — Telephone Encounter (Signed)
[facility] phone number is [phone] option 1 then [phone].

## 2021-10-13 NOTE — Progress Notes (Signed)
@Patient  ID: , male    DOB: Jul 02, 1956, 65 y.o.   MRN: [id number]  Chief Complaint  Patient presents with   Follow-up    Pt states he have pneumonia saturation drops below 88%    Referring provider: [id number], MD  HPI: 66 year old male, never smoked. PMH significant for HTN, rheumatoid arthritis, OSA on CPAP, pulmonary embolism in June 2022, diastolic heart failure. Patient of Dr. 02-12-1992, last seen on 09/28/21.    Previous [facility] pulmonary encounter: 09/28/21- Ryan Glass Ryan Glass is a 64 year old male, never smoker with hypertension, rheumatoid arthritis and obstructive sleep apnea on CPAP who returns to pulmonary clinic shortness of breath.   Patient is feeling better since last visit and he has been transition to sotalol therapy and remains off carvedilol and diltiazem due to bradycardia.  He has been followed closely by his cardiology team and seems to be in a better spot at this time regarding his cardiac function.  He continues to have shortness of breath with exertion though.  He has been taken off supplemental oxygen therapy based on his recent clinic checks with no desaturations upon ambulation.  OV 09/05/21 He was admitted in June 2022 for progressive dyspnea and chest pain where he was found to have a segmental pulmonary emboli after traveling to July 2022 and having dental implant surgery done. He was treated with heparin and then transitioned to eliquis. He was discharged home on supplemental oxygen as he had desaturations in his SpO2 at that time.   Echo 05/26/21 showed LVEF 45-50% (down from 60-65% 02/08/21) with severe asymetric left ventricular hypertrophy. Grade I diastolic dysfunction. RV systolic function is moderately reduced and RV size is moderately enlarged with RVSP 50.51mmHg. His echo on 02/08/21 previously showed normal RV function and mildly enlarged size. There is dilation in the RA and LA on both the 6/17 and 3/2 echos.   LHC 05/29/2021 showed  minimal CAD and RHC showed normal pressures. RV 26/1, PA 25/4, PA mean 14, PW mean 12, LVEDP 8.  HRCT Chest on 06/07/21 shows band like scarring or partial atelectasis of the dependent lungs which is unchanged from prior studies. No air trapping noted. No subpleural reticulation noted.   PFTs 11/2020 normal ratio, no airway obstruction, no restriction, DLCO 71%. Repeat PFTs 08/01/21 show normal ratio, no obstruction or restriction and DLCO 89%.   He again had oxygen desaturations and required 2L of supplemental oxygen with ambulation at last visit on 07/20/21.   He was admitted 08/02/21 for concerns of ventricular trachycardia noted on his Zio extended cardiac monitor. TTE bubble study on 08/03/21 showed positive shunting within 3-6 cardiac cycles suggestive of interatrial shunt. The echo was also concerning for hypertrophic cardiomyopathy. He under went ICD placement on 08/07/21. He was discharged home with carvedilol and verapamil for rate control medications.  Discussions were held between his cardiology team and myself regarding his dyspnea out of proportion to his testing results in regards to his HRCT chest scan and recent PFTs. We decided to move forward with closure of his PFO as he had shunt activity noted on TTE and TEE.  Today in clinic patient did not have desaturations in his SpO2 which is similar to Ryan Glass findings in clinic on 08/24/21. The patient walked a total of 5 laps without decline in his O2 today. Patient's heart rate did drop into the 40s with increased activity. Patient has HR, BP and SpO2 diary which indicates he has had  a heart rate in the 30-40s since 9/16-9/17. He does complain of dizziness and feeling light headed at times.   Hospital course:  Brief History/Interval Summary: 65 y.o. male with medical history significant of combined systolic and diastolic CHF, HOCM, VT s/p ICD, CAD, PAF, HTN, HLD, COPD, PE in 05/2021 on LE, OSA on CPAP who presented with complaints of  shortness of breath.  Symptoms are suggestive of orthopnea and PND.  Denies having any ICD shocks to his knowledge.  Also had a productive cough with greenish sputum.  Not on oxygen at home.  Also mentioned blood in stool recently.  Upon EMS arrival patient was noted to have O2 saturations of 78% on room air.  Chest x-ray showed concern for atelectasis versus infiltrate at the bases.  Patient was given antibiotics.  Was hospitalized for further management.  Initially placed on IV heparin but then developed GI bleed so this was discontinued and reversed with protamine.  Also noted to be in atrial fibrillation with RVR.  Discussions were held with the cardiology and patient was given amiodarone.    Underwent upper endoscopy which showed gastric ulcer and gastritis.  Eliquis was 10-10-2021. Hgb stable over last 24 hours.  10/13/2021- Interim hx  Patient presents today for hospital follow-up. He was admitted from 10/06/21-10/12/21 for acute respiratory failure with hypoxia, CAP and upper GIB. CXR on 10/07/21 showed low volume chest with indistinct density at the bases. Repeat chest imaging on 10/09/21 showed interval improvement in aeration of both lower lung fields suggesting resolving atelectasis/pneumonia. No new infiltrates of signs of pulmonary edema. He was discharged on Omnicef for total 7 days. Pulmonary was not consulted.   Patient is accompanied by his wife today. He does not feel well. He still has a cough and gets out of beath. They report getting low oxygen levels on pulse oximeter at home and feel he was discharged too early.    Allergies  Allergen Reactions   Amlodipine Besylate Swelling and Other (See Comments)    Leg swelling with 10 mg daily am dosing; decreased with BID 5 mg dosing   Aspirin Itching    Immunization History  Administered Date(s) Administered   Influenza,inj,Quad PF,6+ Mos 09/09/2014, 09/10/2015, 10/09/2016, 10/01/2017, 09/26/2018, 09/22/2019, 09/09/2020    Influenza,inj,quad, With Preservative 09/09/2014, 09/10/2015   Influenza-Unspecified 09/09/2014, 09/10/2015, 10/09/2016, 10/01/2017   Moderna SARS-COV2 Booster Vaccination 12/27/2020   PFIZER(Purple Top)SARS-COV-2 Vaccination 02/18/2020, 03/10/2020, 07/25/2020   Tdap 09/07/2008, 09/23/2017   Zoster Recombinat (Shingrix) 02/05/2018, 06/30/2018    Past Medical History:  Diagnosis Date   Ascending aortic aneurysm    a. CT 05/2021 showed 4.4x4.4cm TAA.   Bilateral lower extremity edema    Chronic gout without tophus    followed by dr Gerilyn Nestle    (06-08-2020  per pt last episode right knee 3 wks ago)   CKD (chronic kidney disease), stage II    nephrology--- Jen Mow PA (10-29-2019 note in epic scanned in  media)   Heart failure with mid-range ejection fraction [facility]) 05/27/2021   05/26/21: Left ventricular ejection fraction, by estimation, is 45 to 50%. There is severe asymmetric left ventricular hypertrophy. G1DD present.    History of COVID-19 06/07/2021   HOCM (hypertrophic obstructive cardiomyopathy) ([place])    s/p ICD 07/2021   Hypertension    followed by cardiology, dr t. turner   (05-14-2018 nuclear study in epic , normal perfusion with nuclear ef 61%)   Left hydrocele    Low serum potassium level 04/27/2014   OSA  on CPAP    per pt uses every night   Palpitations followed by dr t. [name]   06-08-2020  still feels palipations due to PVCs when exertion but not with chest pain/ discomfort   Pneumonia due to COVID-19 virus    PVC's (premature ventricular contractions) cardiologist--- dr t. turner   Status post PVC ablation by [name] 2019 with recurrence of frequent PVCs/bigeminy;  prior pseudobradycardia r/t pvcs   Rheumatoid arthritis involving multiple sites [facility])    rheumotology--- dr a. Gerilyn Nestle  ([facility] in [place])    Tobacco History: Social History   Tobacco Use  Smoking Status Never  Smokeless Tobacco Never   Counseling given: Not Answered   Outpatient  Medications Prior to Visit  Medication Sig Dispense Refill   Acetaminophen-Codeine 300-30 MG tablet Take 1-2 tablets by mouth every 12 (twelve) hours as needed for pain.     albuterol (VENTOLIN HFA) 108 (90 Base) MCG/ACT inhaler Inhale 2 puffs into the lungs every 4 (four) hours as needed for wheezing or shortness of breath. 8 g 2   apixaban (ELIQUIS) 5 MG TABS tablet Take 1 tablet (5 mg total) by mouth 2 (two) times daily. 60 tablet 5   atorvastatin (LIPITOR) 40 MG tablet Take 1 tablet (40 mg total) by mouth daily. (Patient taking differently: Take 40 mg by mouth at bedtime.) 90 tablet 3   colchicine 0.6 MG tablet Take 0.6-1.2 mg by mouth daily as needed (for gout flares).     fluticasone (FLONASE) 50 MCG/ACT nasal spray Place 1 spray into both nostrils daily. (Patient taking differently: Place 2 sprays into both nostrils at bedtime.)     folic acid (FOLVITE) 1 MG tablet Take 1 mg by mouth daily.     furosemide (LASIX) 40 MG tablet Take 1 tablet (40 mg total) by mouth daily as needed (weight gain). Take 1 tablet by mouth daily for 3 days, then decrease to only as needed for increase weight gain 30 tablet 3   gabapentin (NEURONTIN) 600 MG tablet Take 600 mg by mouth with breakfast, with lunch, and with evening meal.     guaiFENesin (MUCINEX) 600 MG 12 hr tablet Take 1 tablet (600 mg total) by mouth 2 (two) times daily for 7 days. 14 tablet 0   HUMIRA PEN 40 MG/0.4ML PNKT Inject 40 mg into the skin every 14 (fourteen) days.     hydrALAZINE (APRESOLINE) 100 MG tablet Take 1 tablet (100 mg total) by mouth every 8 (eight) hours. 90 tablet 6   Magnesium Oxide 400 MG CAPS Take 1 capsule (400 mg total) by mouth daily. 90 capsule 3   methotrexate 2.5 MG tablet Take 15 mg by mouth every Sunday.     metoprolol tartrate (LOPRESSOR) 50 MG tablet Take 1 tablet (50 mg total) by mouth 2 (two) times daily. 60 tablet 1   montelukast (SINGULAIR) 10 MG tablet Take 1 tablet (10 mg total) by mouth at bedtime. 90 tablet  3   pantoprazole (PROTONIX) 40 MG tablet Take 1 tablet (40 mg total) by mouth 2 (two) times daily. 60 tablet 1   potassium chloride SA (KLOR-CON) 20 MEQ tablet Take 2 tablets (40 mEq total) by mouth 2 (two) times daily. 120 tablet 6   predniSONE (DELTASONE) 5 MG tablet Take 5 mg by mouth daily with breakfast.     sildenafil (REVATIO) 20 MG tablet Take 20 mg by mouth daily as needed (erectile dysfunction).     sotalol (BETAPACE) 120 MG tablet Take 1 tablet (  120 mg total) by mouth every 12 (twelve) hours. 60 tablet 6   spironolactone (ALDACTONE) 25 MG tablet Take 1 tablet (25 mg total) by mouth daily. 30 tablet 6   sucralfate (CARAFATE) 1 GM/10ML suspension Take 10 mLs (1 g total) by mouth 4 (four) times daily -  with meals and at bedtime. 420 mL 0   cefdinir (OMNICEF) 300 MG capsule Take 1 capsule (300 mg total) by mouth every 12 (twelve) hours for 2 days. 4 capsule 0   No facility-administered medications prior to visit.   Review of Systems  Review of Systems  Constitutional:  Positive for fatigue.  HENT:  Positive for congestion.   Respiratory:  Positive for cough and shortness of breath. Negative for chest tightness and wheezing.     Physical Exam  BP 112/82 (BP Location: Right Arm, Patient Position: Sitting, Cuff Size: Normal)   Pulse 80   Temp 98.6 F (37 C) (Oral)   Ht 5\' 10"  (1.778 m)   Wt 237 lb 12.8 oz (107.9 kg)   SpO2 94%   BMI 34.12 kg/m  Physical Exam Constitutional:      Appearance: Normal appearance.  HENT:     Head: Normocephalic and atraumatic.  Cardiovascular:     Rate and Rhythm: Normal rate and regular rhythm.  Pulmonary:     Effort: Pulmonary effort is normal.     Breath sounds: Normal breath sounds. No wheezing or rales.  Musculoskeletal:        General: Normal range of motion.  Skin:    General: Skin is warm and dry.  Neurological:     General: No focal deficit present.     Mental Status: He is alert and oriented to person, place, and time. Mental  status is at baseline.  Psychiatric:        Mood and Affect: Mood normal.        Behavior: Behavior normal.        Thought Content: Thought content normal.        Judgment: Judgment normal.     Lab Results:  CBC    Component Value Date/Time   WBC 4.0 10/12/2021 0257   RBC 2.73 (L) 10/12/2021 0257   HGB 9.2 (L) 10/12/2021 0257   HGB 13.8 08/15/2021 1052   HCT 27.1 (L) 10/12/2021 0257   HCT 40.4 08/15/2021 1052   PLT 146 (L) 10/12/2021 0257   PLT 193 08/15/2021 1052   MCV 99.3 10/12/2021 0257   MCV 99 (H) 08/15/2021 1052   MCH 33.7 10/12/2021 0257   MCHC 33.9 10/12/2021 0257   RDW 14.6 10/12/2021 0257   RDW 13.6 08/15/2021 1052   LYMPHSABS 1.1 10/07/2021 1031   LYMPHSABS 0.8 02/20/2021 1215   MONOABS 0.3 10/07/2021 1031   EOSABS 0.0 10/07/2021 1031   EOSABS 0.0 02/20/2021 1215   BASOSABS 0.0 10/07/2021 1031   BASOSABS 0.0 02/20/2021 1215    BMET    Component Value Date/Time   NA 136 10/12/2021 0257   NA 141 10/05/2021 1130   K 3.2 (L) 10/12/2021 0257   CL 106 10/12/2021 0257   CO2 23 10/12/2021 0257   GLUCOSE 91 10/12/2021 0257   BUN 22 10/12/2021 0257   BUN 25 10/05/2021 1130   CREATININE 1.32 (H) 10/12/2021 0257   CALCIUM 8.1 (L) 10/12/2021 0257   GFRNONAA 60 (L) 10/12/2021 0257   GFRAA 65 02/03/2021 1515    BNP    Component Value Date/Time   BNP 33.9 10/07/2021 1031  ProBNP    Component Value Date/Time   PROBNP 355 09/19/2021 0938    Imaging: DG Chest Port 1 View  Result Date: 10/09/2021 CLINICAL DATA:  Pneumonia EXAM: PORTABLE CHEST 1 VIEW COMPARISON:  Previous studies including the examination of 10/07/2021 FINDINGS: Transverse diameter of heart is increased. Pacemaker/defibrillator battery is seen in the left infraclavicular region. There are no signs of pulmonary edema. There is interval decrease in infiltrates in both lower lung fields suggesting resolving pneumonia/atelectasis. There is no significant pleural effusion or pneumothorax.  IMPRESSION: There is interval improvement in aeration of both lower lung fields suggesting resolving atelectasis/pneumonitis. There are no new infiltrates or signs of pulmonary edema. Electronically Signed   By: Elmer Picker M.D.   On: 10/09/2021 08:57   DG Chest Port 1 View  Result Date: 10/07/2021 CLINICAL DATA:  Shortness of breath EXAM: PORTABLE CHEST 1 VIEW COMPARISON:  09/08/2021 FINDINGS: Chronic cardiopericardial enlargement. Aortic tortuosity. Low volume chest with indistinct density at the bases. Dual-chamber pacer leads from the left in unremarkable position. Artifact from EKG leads. IMPRESSION: Low volume chest with atelectasis or infiltrate at the bases. Chronic cardiomegaly.  No edema. Electronically Signed   By: Jorje Guild M.D.   On: 10/07/2021 11:20   CUP PACEART INCLINIC DEVICE CHECK  Result Date: 09/19/2021 ICD check in clinic. Normal device function. Thresholds and sensing consistent with previous device measurements. Impedance trends stable over time. No mode switches. No ventricular arrhythmias. Histogram distribution appropriate for patient and level of  activity. No changes made this session. Device programmed at acute safety margins. Device programmed to optimize intrinsic conduction. Estimated longevity 4 yr, 10 mo. Pt enrolled in remote follow-up. Patient education completed    Assessment & Plan:   CAP (community acquired pneumonia) - Slowly improving; Patient has residual symptoms of shortness of breath and cough. He had faint rhonchi right lower lobe on exam. Ambulatory walk test today on forehead probe did not reveal oxygen desaturation below 95% RA. We will extend cefdinir 300mg  additional three days and send in prednisone taper. Recommend he continue Mucinex twice daily and take promethazine-DM 40ml QID for cough. Needs repeat CXR today. FU in 2-4 weeks with Dr. Erin Fulling.   40 mins spent on case; > 50% face to faced with patient   Martyn Ehrich,  NP 10/13/2021

## 2021-10-13 NOTE — Telephone Encounter (Signed)
Will call Whitney on Monday. Patient has been walked numerous times and did not qualify for O2. Home health referral can be placed by PCP as this has never been discussed in our office.

## 2021-10-13 NOTE — Telephone Encounter (Signed)
Patient seen by pulmonology sp02 94% RA.  Walk test completed in clinic and patient remained in the 90s per chart review. Repeat chest xray  CLINICAL DATA:  Community-acquired pneumonia   EXAM: CHEST - 2 VIEW   COMPARISON:  10/09/2021   FINDINGS: Dual lead AICD noted.  Mild cardiomegaly.  No edema.   Mild medial retrocardiac density persists and is nonspecific. The right lung appears clear. No significant bony abnormality.   IMPRESSION: 1. Roughly similar appearance to the prior exam. There is a small amount of residual medial left basilar opacity on the frontal projection which is poorly corroborated on the lateral projection, but could be due to atelectasis, residual pneumonia, or localized pleural fluid. 2. Mild cardiomegaly.     Electronically Signed   By: Gaylyn Rong M.D.   On: 10/13/2021 17:40  Pulmonology called in prednisone taper and extended cefdinir course also.   Recommend he continue Mucinex twice daily and take promethazine-DM 40ml QID for cough. FU in 2-4 weeks with Dr. Francine Graven. Will follow up with patient via telephone tomorrow.

## 2021-10-13 NOTE — Patient Instructions (Addendum)
Recommendations: - We will extend antibiotic additional 3 days and send in prednisone taper  - We will send in prescription cough medicine  - Continue Mucinex twice daily   Orders: - Check ambulatory O2 saturation on forehead probe  - Please provide patient with note that states patient can return to work part time on November 14th and full-time on November 21st. He should not lift more than 10 lbs.   Follow-up: - 2-4 week with Dr. Francine Graven

## 2021-10-13 NOTE — Assessment & Plan Note (Addendum)
-   Slowly improving; Patient has residual symptoms of shortness of breath and cough. He had faint rhonchi right lower lobe on exam. Ambulatory walk test today on forehead probe did not reveal oxygen desaturation below 95% RA. We will extend cefdinir 300mg  additional three days and send in prednisone taper. Recommend he continue Mucinex twice daily and take promethazine-DM 54ml QID for cough. Needs repeat CXR today. FU in 2-4 weeks with Dr. 4m.

## 2021-10-13 NOTE — Telephone Encounter (Signed)
Patient contacted via telephone stated discharged to home last night.  Called PCM office this am for hospital discharge follow up appt and was scheduled for next week.  Pulmonology appt today 1630.  Patient and spouse reported that patient is having trouble getting sp02 above 90 at home this morning.  He did not sleep well last night or use his CPAP.  Patient planning to do his am inhaler now.  Discussed to use his albuterol inhaler 2 puffs now also.  Spouse reported sp02 improved while sitting at kitchen table talking with me to 90-91% RA  Patient feeling tired and planning to take nap before pulmonology appt.  Discussed wearing cpap for nap.  Discussed spouse or daughter can contact me at [phone] if questions or concerns.  Discussed finish using Breo Ellipta inhaler until no more puffs remaining then start Advair Diskus that he picked up from pharmacy today. Discussed advair diskus and breo ellipta have 1 medication in common fluticasone inhaled but the the second different  vilanterol versus salmeterol so he may notice a difference when he starts to use advair diskus but medication change due to cost was discussed with Dr Francine Graven (pulmonology).   Spouse stated she assisted patient to go through medication minders and adjust all his medications to match discharge summary instructions this morning as he was getting confused/losing train of thought.  Patient is taking omnicef for pneumonia.  Patient A&Ox3 spoke full sentences without difficulty.  1 cough mild during 10 minute telephone call patient reported mucous is yellow.  No wheezing, shortness of breath/nasal congestion/sniffing or throat clearing audible during call.  Discussed with patient and spouse I would follow up with them via telephone again tomorrow.  Spouse and patient verbalized understanding information/instructions, agreed with plan of care and had no further questions at this time.

## 2021-10-15 NOTE — Telephone Encounter (Signed)
Patient contacted via telephone stated feeling a bit better today more energy and was able to go up and down the stairs.  Didn't check sp02 again today as still in his wife's bag.  Cough improved with prescription cough medicine.  Discussed with patient to check his sp02 tonight.  Will call to follow up with him again tomorrow.  Patient A&Ox3 spoke full sentences without difficulty; no throat clearing or congestion noted mild nonproductive cough during 2 minute call.  Patient verbalized understanding information/instructions, agreed with plan of care and had no further questions at this time.

## 2021-10-16 NOTE — Telephone Encounter (Signed)
Ryan Glass with [employer] calling to see if a referral can be placed for home health services. Pt is upset because he is very SOB all the time and was just released from the hospital 11/3.This was pt's 5th time being in hosptal since 05/2021. Please advise [phone] option [phone]

## 2021-10-16 NOTE — Telephone Encounter (Signed)
Called and spoke with Whitney at [facility] at [phone] option [phone] who states that the patient is very upset because he wants oxygen and is short of breath all the time. Advised her that patient was just seen on 10/13/21 and did not qualify for oxygen and didn't drop below 95%. Advised her that I would have to send a message to the provider to see if they are willing to do an order for home health and call her back.  Dr. Francine Graven or Waynetta Sandy since you recently saw patient please advise

## 2021-10-16 NOTE — Progress Notes (Signed)
Please let patient know CXR appears similar to previous, small amount residual left opacity. No changes. Complete abx as prescribed and follow up with DR. Dewald in 2-4 weeks as directed

## 2021-10-16 NOTE — Telephone Encounter (Signed)
Home health is fine. He did not qualify for oxygen, if he is not feeling well than needs to present back to ED.

## 2021-10-16 NOTE — Progress Notes (Signed)
Ryan Glass, The biopsies taken from your stomach were notable for mild chronic gastritis (inflammation), but there was no evidence of Helicobacter pylori infection. The stomach ulcers were most likely related to medications (NSAIDs) commonly used to treat pain.  Please refrain from NSAID usage.  Continue to take your acid suppressing medication twice a day for 8 weeks. A repeat upper endoscopy is typically performed 8 weeks after gastric ulcers are identified to ensure that they have healed.  Your procedure would need to be performed in the hospital setting due to your significant cardiac comorbidities.  I would like to see you in the GI clinic prior to scheduling this procedure to ensure that the benefits of the procedure outweigh the potential risks.

## 2021-10-17 ENCOUNTER — Other Ambulatory Visit: Payer: Self-pay | Admitting: *Deleted

## 2021-10-17 DIAGNOSIS — J9611 Chronic respiratory failure with hypoxia: Secondary | ICD-10-CM

## 2021-10-17 NOTE — Telephone Encounter (Signed)
Referal was placed for rehab.  Nothing further is needed.

## 2021-10-17 NOTE — Telephone Encounter (Signed)
Can we make sure referral was placed for cardiopulmonary rehab - stat order

## 2021-10-17 NOTE — Telephone Encounter (Signed)
Patient contacted via telephone and stated sp02 RA yesterday before bedtime 90%; this am on awakening 94% after CPAP during sleep.  Patient has follow up with [facility] tomorrow.  Discussed on discharge it was recommended patient have labs CBC/BMET in 1 week.  Discussed with patient anemia will cause some fatigue and he may notice less tolerance for exercise/feeling short of breath. Patient has 10/24/21 appt scheduled for cardiac rehab.  Current work excuse ends 10 Nov.  Discussed with patient to discuss extension work excuse with PCM if he doesn't feel he will have stamina to work full shift by Monday.  Discussed with patient anemia is not the same as bronchitis or pneumonia flare up/down for resolution time e.g. return to baseline.  Anemia will take longer for red blood cells to be made.  Patient is also still taking prednisone taper on 30mg  at this time. Patient is not taking diclofenac and is taking tylenol for pain. Patient reported cough improved and he is still taking cough medicine also prn.  Patient reported blood pressure this am 98/60 at home.  Patient A&Ox3 spoke full sentences without difficulty no cough/throat clearing or nasal congestion audible during 8 minute telephone call.  Patient verbalized understanding information/instructions, agreed with plan of care and had no further questions at this time.  HR notified PCM appt tomorrow.

## 2021-10-18 NOTE — Telephone Encounter (Signed)
Patient saw PCM today and had labs drawn; potassium/BUN/ALT elevated; Hct/Hgb steady still anemic and GFR 53.  Full provider note still pending in Care everywhere.  Rheumatology appt 10/20/21 and cardiac rehab appt 10/24/21  WBC 8.1 4.4 - 11.0 x 10*3/uL   10/18/2021 3:35 PM EST [facility]    RBC 3.17 (L) 4.50 - 5.90 x 10*6/uL   10/18/2021 3:35 PM EST [facility]    Hemoglobin 10.9 (L) 14.0 - 17.5 G/DL   26/83/4196 2:22 PM EST [facility]    Hematocrit 31.8 (L) 41.5 - 50.4 %   10/18/2021 3:35 PM EST [facility]    MCV 100.3 (H) 80.0 - 96.0 FL   10/18/2021 3:35 PM EST [facility]    MCH 34.2 (H) 27.5 - 33.2 PG   10/18/2021 3:35 PM EST [facility]    MCHC 34.1 33.0 - 37.0 G/DL   97/98/9211 9:41 PM EST [facility]    RDW 14.9 12.3 - 17.0 %   10/18/2021 3:35 PM EST [facility]    Platelets 340 150 - 450 X 10*3/uL   10/18/2021 3:35 PM EST [facility]    MPV 8.4 6.8 - 10.2 FL   10/18/2021 3:35 PM EST [facility]    Neutrophil % 87 %   10/18/2021 3:35 PM EST [facility]    Lymphocyte % 8 %   10/18/2021 3:35 PM EST [facility]    Monocyte % 5 %   10/18/2021 3:35 PM EST [facility]    Eosinophil % 0 %   10/18/2021 3:35 PM EST [facility]    Basophil % 0 %   10/18/2021 3:35 PM EST [facility]    Neutrophil Absolute 7.1 1.8 - 7.8 x 10*3/uL   10/18/2021 3:35 PM EST [facility]    Lymphocyte Absolute 0.7 (L) 1.0 - 4.8 x 10*3/uL   10/18/2021 3:35 PM EST [facility]    Monocyte Absolute 0.4 0.0 - 0.8 x 10*3/uL   10/18/2021 3:35 PM EST [facility]    Eosinophil Absolute 0.0 0.0 - 0.5 x 10*3/uL   10/18/2021 3:35 PM EST [facility]    Basophil Absolute 0.0 0.0 - 0.2 x 10*3/uL   10/18/2021 3:35 PM EST [facility]     Lab Results -  (ABNORMAL) CBC and Differential (10/18/2021 10:38 AM EST) Specimen (Source) Anatomical Location / Laterality Collection Method / Volume Collection Time Received Time  Blood   Clinic Venipuncture / Unknown 10/18/2021 10:38 AM EST 10/18/2021 3:19 PM EST   Lab Results - (ABNORMAL) CBC and Differential (10/18/2021 10:38 AM EST) Authorizing Provider Result Type  Rafaela Wynn Banker MD LAB BLOOD ORDERABLES   Lab Results - (ABNORMAL) CBC and Differential (10/18/2021 10:38 AM EST) Performing Organization Address City/State/ZIP Code Phone Number  [facility]   CLIA# [id number]   [address]   [place],  [address]        Back to top of Lab Results    (ABNORMAL) Comprehensive Metabolic Panel (10/18/2021 10:38 AM EST) Lab Results - (ABNORMAL) Comprehensive Metabolic Panel (10/18/2021 10:38 AM EST) Component Value Ref Range Test Method Analysis Time Performed At Pathologist Signature  Sodium 137 135 - 146 MMOL/L   10/18/2021 4:08 PM EST [facility]  Potassium 5.4 (H) 3.5 - 5.3 MMOL/L   10/18/2021 4:08 PM EST [facility]    Chloride 111 (H) 98 - 110 MMOL/L   10/18/2021 4:08 PM EST [facility]    CO2 21 (L) 23 - 30 MMOL/L   10/18/2021 4:08 PM EST [facility]    BUN 29 (H) 8 - 24 MG/DL   10/18/2021 4:08 PM EST [facility]    Glucose 98 70 - 99 MG/DL   10/18/2021 4:08 PM EST [facility]    Comment: Patients taking eltrombopag at doses >/= 100 mg daily may show falsely elevated values of 10% or greater.  Creatinine 1.47 0.50 - 1.50 MG/DL   10/18/2021 4:08 PM EST [facility]    Calcium 9.0 8.5 - 10.5 MG/DL   10/18/2021 4:08 PM EST [facility]    Total Protein 6.4 6.0 - 8.3 G/DL   10/18/2021 4:08 PM EST [facility]    Comment: Patients taking eltrombopag at doses >/= 100 mg daily may show falsely elevated values of 10% or greater.  Albumin  4.0 3.5 - 5.0 G/DL    10/18/2021 4:08 PM EST [facility]    Total Bilirubin 0.5 0.1 - 1.2 MG/DL   10/18/2021 4:08 PM EST [facility]    Comment: Patients taking eltrombopag at doses >/= 100 mg daily may show falsely elevated values of 10% or greater.  Alkaline Phosphatase 84 25 - 125 IU/L or U/L   10/18/2021 4:08 PM EST [facility]    AST (SGOT) 35 5 - 40 IU/L or U/L   10/18/2021 4:08 PM EST [facility]    ALT (SGPT) 54 (H) 5 - 50 IU/L or U/L   10/18/2021 4:08 PM EST [facility]    Anion Gap 5 4 - 14 MMOL/L   10/18/2021 4:08 PM EST [facility]    Est. GFR 53 (L) >=60 ML/MIN/1.73 M*2

## 2021-10-24 ENCOUNTER — Ambulatory Visit: Payer: No Typology Code available for payment source | Admitting: Physical Therapy

## 2021-10-24 ENCOUNTER — Telehealth: Payer: Self-pay | Admitting: Primary Care

## 2021-10-24 NOTE — Telephone Encounter (Signed)
Request for updated physician's statement and ov notes rec'd from [facility] on 11/4.  Form has been filled in and sent to E. Clent Ridges, NP for signature.

## 2021-10-24 NOTE — Telephone Encounter (Signed)
HR Ryan Glass notified me patient planning to stay home until all restrictions cleared.

## 2021-10-25 ENCOUNTER — Ambulatory Visit: Payer: No Typology Code available for payment source | Attending: Internal Medicine | Admitting: Physical Therapy

## 2021-10-25 NOTE — Telephone Encounter (Signed)
Patient contacted via telephone follow up.  Patient reported he is at work today-scheduled half days but worked until [employer].  Feeling like he has fluid in lungs today and wheezing some.  Took lasix yesterday but held today due to meetings/couldn't be running to bathroom.  Discussed with patient to check blood pressure when he gets home today as clinic closed Wednesdays and if blood pressure elevated/leg swelling to take lasix this afternoon.  Patient reported weight at home 241lbs yesterday.  Patient stated has been fatigued every day this week after work.  Taking nap after lunch waking up to eat dinner then going to sleep again and sleeping all night.  He stated he is using CPAP at night.  Discussed wearing CPAP during long naps also.  Discussed using albuterol inhaler now if feeling wheezing.  Patient unsure if he has albuterol inhaler with him will check his lunchbox.  Patient spoke full sentences without difficulty A&Ox3 spoke full sentences without difficulty no wheezing/shortness of breath/cough/throat clearing/congestion audible during 7 minute telephone call.  Patient saw rheumatology 10/20/21 sp02 90% RA.  Methotrexate held due to GI bleed.  Humira stopped due to CHF.  Trying to get authorization for Orencia therapy.  Next appt in 2 months.  Has first cardiac rehab appt this afternoon scheduled.  Cardiology Dr Mayford Knife scheduled for tomorrow 11/17.  Patient stated knees really bothering him since diclofenac/methotrexate stopped.  Using tylenol am/pm but not getting any relief.  Discussed with patient if joints hot/swollen ice and if stiff/normal temp consider heat.  Will distribute to patient neoprene knee sleeve tomorrow from clinic supply to see if helps due to limitations on medications due to GI bleed/cardiac history.  Patient will come to [facility] tomorrow am for check of lung sounds/SP02/BP check and knee sleeve.  HR Tonya and RN Rolly Salter notified patient returned to work 11/14 1/2  days per his restrictions.  Patient verbalized understanding information/instructions, agreed with plan of care and had no further questions at this time.

## 2021-10-26 ENCOUNTER — Ambulatory Visit (INDEPENDENT_AMBULATORY_CARE_PROVIDER_SITE_OTHER): Payer: No Typology Code available for payment source | Admitting: Cardiology

## 2021-10-26 ENCOUNTER — Encounter: Payer: Self-pay | Admitting: Cardiology

## 2021-10-26 ENCOUNTER — Ambulatory Visit: Payer: Self-pay | Admitting: *Deleted

## 2021-10-26 ENCOUNTER — Telehealth (HOSPITAL_COMMUNITY): Payer: Self-pay

## 2021-10-26 ENCOUNTER — Other Ambulatory Visit: Payer: Self-pay

## 2021-10-26 VITALS — BP 136/96 | HR 80 | Resp 22

## 2021-10-26 VITALS — BP 128/82 | HR 80 | Ht 70.0 in | Wt 242.0 lb

## 2021-10-26 DIAGNOSIS — R0609 Other forms of dyspnea: Secondary | ICD-10-CM | POA: Diagnosis not present

## 2021-10-26 DIAGNOSIS — I493 Ventricular premature depolarization: Secondary | ICD-10-CM

## 2021-10-26 DIAGNOSIS — I2699 Other pulmonary embolism without acute cor pulmonale: Secondary | ICD-10-CM

## 2021-10-26 DIAGNOSIS — R600 Localized edema: Secondary | ICD-10-CM

## 2021-10-26 DIAGNOSIS — I7121 Aneurysm of the ascending aorta, without rupture: Secondary | ICD-10-CM

## 2021-10-26 DIAGNOSIS — I10 Essential (primary) hypertension: Secondary | ICD-10-CM | POA: Diagnosis not present

## 2021-10-26 DIAGNOSIS — R0602 Shortness of breath: Secondary | ICD-10-CM

## 2021-10-26 DIAGNOSIS — I421 Obstructive hypertrophic cardiomyopathy: Secondary | ICD-10-CM | POA: Diagnosis not present

## 2021-10-26 DIAGNOSIS — Q2112 Patent foramen ovale: Secondary | ICD-10-CM

## 2021-10-26 NOTE — Telephone Encounter (Signed)
Signed updated disability form was faxed to [employer] [facility] fax# [phone].  Included ov notes from 10/20 and 11/4 and chest x-ray results from 11/4.

## 2021-10-26 NOTE — Addendum Note (Signed)
Addended by: Theresia Majors on: 10/26/2021 03:11 PM   Modules accepted: Orders

## 2021-10-26 NOTE — Patient Instructions (Signed)
Medication Instructions:   Your physician recommends that you continue on your current medications as directed. Please refer to the Current Medication list given to you today.  *If you need a refill on your cardiac medications before your next appointment, please call your pharmacy*   Lab Work:  TODAY-BMET  If you have labs (blood work) drawn today and your tests are completely normal, you will receive your results only by: . MyChart Message (if you have MyChart) OR . A paper copy in the mail If you have any lab test that is abnormal or we need to change your treatment, we will call you to review the results.    Follow-Up: At [facility], you and your health needs are our priority.  As part of our continuing mission to provide you with exceptional heart care, we have created designated Provider Care Teams.  These Care Teams include your primary Cardiologist (physician) and Advanced Practice Providers (APPs -  Physician Assistants and Nurse Practitioners) who all work together to provide you with the care you need, when you need it.  Your next appointment:   3 months  The format for your next appointment:   In Person  Provider:   Traci Turner, MD    

## 2021-10-26 NOTE — Telephone Encounter (Signed)
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  BBS with crackles bases.  Spoke full sentences without difficulty mild dyspnea after walking from clinic after visit with RN Rolly Salter to his dept. Gait sure and steady in warehouse. A&Ox3 no cough/congestion/throat clearing noted.  Skin warm dry and pink.  Lower eyelids 1-2+/4 nonpitting edema and allergic shiners; lower leg edema scant.  Patient reported he took lasix before bed yesterday and peed all night.  Weight 247lbs prior to bed and 239 lbs this am after waking up.  Breathing feels better.  Didn't use albuterol inhaler yesterday.  Was tired again yesterday after shift ate lunch went to take nap before his cardiac rehab appt and slept through appt work up at dinner.  Ate dinner and fell asleep after dinner slept through the night except when waking up and had to urinate.  Patient concerned his memory is not sharp since his medications were changed at last hospital visit.  Stated he will discuss with cardiologist Dr Mayford Knife today.  He has missed scheduled meetings and fatigued after work.  This is bothering him a great deal.  Will follow up with patient this weekend via telephone.  Patient verbalized understanding information/instructions, agreed with plan of care and had no further questions at this time.  Discussed with RN Rolly Salter follow up with patient regarding if he wants to trial neoprene knee sleeve for knee pain when he returns tomorrow as she forgot to fit and dispense to patient today because he was in hurry to leave and not be late for cardiology appt.

## 2021-10-26 NOTE — Progress Notes (Signed)
Cardiology Office Note    Date:  10/26/2021   ID:  Ryan Glass, DOB 1956/11/08, MRN [id number]   PCP:  [name], MD   [facility]  Cardiologist:  [name], MD  Electrophysiologist:  [name], MD  Chief Complaint  Patient presents with   Follow-up    PFO, HOCM, CAD, hypertension, dyslipidemia, edema, shortness of breath, PVCs, recent PE    History of Present Illness:  Ryan Glass is a 65 y.o. male  with a hx of PVCs s/p PVC ablation by Dr. 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  He was found to have an acute PE with RV failure mildly reduced LVEF 45 to 50%.  Right and left heart cath 05/29/2021  showed 20% proximal RCA, 20% proximal to mid circumflex, 30% mid LAD normal right and left heart pressures.  His shortness of breath was felt to be out of proportion to his very small PE.  I reviewed the echo images and felt his RV was mildly dilated but RV EF was normal and LVEF was normal. He was referred for pulmonary consult due to ongoing SOB and restarted diltiazem 180 mg daily and Lasix was stopped because he was not fluid overloaded.  He was restarted on chlorthalidone 25 mg daily for hypertension.  He was see back in the office in August by Herma Carson, PA and was still complaining of DOE with little activity. He also was complaining of low HRs.  He saw pulmonary for Dyspnea on exertion/COPD placed on home O2 by pulmonary for O2 sats in the 80s with walking.   He was noted on an EKG done by PCP to have bradycardia and frequent PVC's. He was also complaining of complaining of leg swelling but had been eating fast food at least 3 times weekly.      He wore a heart monitor to  assess bradycardia and PVC load and was noted to have symptomatic ventricular tachycardia on monitor as well as PAF and was admitted to ER. Echo showed normal LVF with EF 60-65% with mild LVH, mildly reduced RVF, mild RVE and positive PFO.  He was started on Verapamil for AFib/VT/PVCs and monitored on tele.  cMRI showed findings c/w HOCM with LVEF 52% and RVEF 45%.  He underwent ICD placement on 08/07/2021. He was discharged home on Carvedilol and Verapamil.    Since I saw him last he was hospitalized at the end of October with worsening shortness of breath and acute hypoxic respiratory failure.  He was eventually weaned off oxygen and was hypovolemic on admission.  Unfortunately he was found to have a GI bleed and underwent EGD showing gastric ulcer and gastritis.  Hemoglobin was stabilized at discharge sent.  It was recommended that he hold aspirin until his ulcer treatment was completed.  His losartan had been held because of soft blood pressures in the hospital.  His chlorthalidone was stopped and he was continued on oral Lasix and spironolactone restarted.  He also needs follow-up with Dr. Excell Seltzer outpatient for timing of PFO closure which was already supposed to have occurred but was put on hold due to increased PVC burden.  He is here today for followup and is doing about the same as he  was at discharge. He still is SOB which is about the same as when he was in hospital and occurs when walking up stairs. He denies any chest pain or pressure, PND, orthopnea, LE edema, dizziness, palpitations or syncope. HE is compliant with his meds and is tolerating meds with no SE.     Past Medical History:  Diagnosis Date   Ascending aortic aneurysm    a. CT 05/2021 showed 4.4x4.4cm TAA.   Bilateral lower extremity edema    Chronic gout without tophus    followed by dr Gerilyn Nestle    (06-08-2020  per pt last episode right knee 3 wks ago)   CKD (chronic kidney disease), stage II    nephrology--- Jen Mow  PA (10-29-2019 note in epic scanned in  media)   Heart failure with mid-range ejection fraction [facility]) 05/27/2021   05/26/21: Left ventricular ejection fraction, by estimation, is 45 to 50%. There is severe asymmetric left ventricular hypertrophy. G1DD present.    History of COVID-19 06/07/2021   HOCM (hypertrophic obstructive cardiomyopathy) ([place])    s/p ICD 07/2021   Hypertension    followed by cardiology, dr t. Deaveon Schoen   (05-14-2018 nuclear study in epic , normal perfusion with nuclear ef 61%)   Left hydrocele    Low serum potassium level 04/27/2014   OSA on CPAP    per pt uses every night   Palpitations followed by dr t. [name]   06-08-2020  still feels palipations due to PVCs when exertion but not with chest pain/ discomfort   Pneumonia due to COVID-19 virus    PVC's (premature ventricular contractions) cardiologist--- dr t. Merced Hanners   Status post PVC ablation by [name] 2019 with recurrence of frequent PVCs/bigeminy;  prior pseudobradycardia r/t pvcs   Rheumatoid arthritis involving multiple sites [facility])    rheumotology--- dr a. Gerilyn Nestle  ([facility] in [place])    Past Surgical History:  Procedure Laterality Date   BIOPSY  10/08/2021   Procedure: BIOPSY;  Surgeon: Daryel November, MD;  Location: [place];  Service: Gastroenterology;;   Kathleen Argue STUDY  08/18/2021   Procedure: BUBBLE STUDY;  Surgeon: Josue Hector, MD;  Location: [facility] ENDOSCOPY;  Service: Cardiovascular;;   ESOPHAGOGASTRODUODENOSCOPY N/A 10/08/2021   Procedure: ESOPHAGOGASTRODUODENOSCOPY (EGD);  Surgeon: Daryel November, MD;  Location: [place];  Service: Gastroenterology;  Laterality: N/A;   HYDROCELE EXCISION Left 06/14/2020   Procedure: LEFT  HYDROCELECTOMY ADULT;  Surgeon: Robley Fries, MD;  Location: [facility];  Service: Urology;  Laterality: Left;   ICD IMPLANT N/A 08/07/2021   Procedure: ICD IMPLANT;  Surgeon: Evans Lance, MD;  Location: [place] CV LAB;  Service: Cardiovascular;   Laterality: N/A;   INCISIONAL HERNIA REPAIR  02-23-2016   @[facility]    LAPAROSCOPIC   LAPAROSCOPIC INGUINAL HERNIA REPAIR Bilateral 08-22-2015  @[facility]    AND UMBILICAL HERNIA REPAIR   PVC ABLATION N/A 10/07/2018   Procedure: PVC ABLATION;  Surgeon: Evans Lance, MD;  Location: [place] [facility];  Service: Cardiovascular;  Laterality: N/A;   RIGHT/LEFT HEART CATH AND CORONARY ANGIOGRAPHY N/A 05/29/2021   Procedure: RIGHT/LEFT HEART CATH AND CORONARY ANGIOGRAPHY;  Surgeon: Burnell Blanks, MD;  Location: [place] [facility];  Service: Cardiovascular;  Laterality: N/A;   TEE WITHOUT CARDIOVERSION N/A 08/18/2021   Procedure: TRANSESOPHAGEAL ECHOCARDIOGRAM (TEE);  Surgeon: Josue Hector, MD;  Location: [facility] ENDOSCOPY;  Service: Cardiovascular;  Laterality: N/A;   UMBILICAL HERNIA REPAIR  child    Current Medications: Current  Meds  Medication Sig   Acetaminophen-Codeine 300-30 MG tablet Take 1-2 tablets by mouth every 12 (twelve) hours as needed for pain.   albuterol (VENTOLIN HFA) 108 (90 Base) MCG/ACT inhaler Inhale 2 puffs into the lungs every 4 (four) hours as needed for wheezing or shortness of breath.   apixaban (ELIQUIS) 5 MG TABS tablet Take 1 tablet (5 mg total) by mouth 2 (two) times daily.   atorvastatin (LIPITOR) 40 MG tablet Take 1 tablet (40 mg total) by mouth daily. (Patient taking differently: Take 40 mg by mouth at bedtime.)   cefdinir (OMNICEF) 300 MG capsule Take 1 capsule twice daily for additional three days   colchicine 0.6 MG tablet Take 0.6-1.2 mg by mouth daily as needed (for gout flares).   fluticasone (FLONASE) 50 MCG/ACT nasal spray Place 1 spray into both nostrils daily. (Patient taking differently: Place 2 sprays into both nostrils at bedtime.)   folic acid (FOLVITE) 1 MG tablet Take 1 mg by mouth daily.   furosemide (LASIX) 40 MG tablet Take 1 tablet (40 mg total) by mouth daily as needed (weight gain). Take 1 tablet by mouth daily for 3 days, then decrease to only  as needed for increase weight gain   gabapentin (NEURONTIN) 600 MG tablet Take 600 mg by mouth with breakfast, with lunch, and with evening meal.   HUMIRA PEN 40 MG/0.4ML PNKT Inject 40 mg into the skin every 14 (fourteen) days.   hydrALAZINE (APRESOLINE) 100 MG tablet Take 1 tablet (100 mg total) by mouth every 8 (eight) hours.   Magnesium Oxide 400 MG CAPS Take 1 capsule (400 mg total) by mouth daily.   methotrexate 2.5 MG tablet Take 15 mg by mouth every Sunday.   metoprolol tartrate (LOPRESSOR) 50 MG tablet Take 1 tablet (50 mg total) by mouth 2 (two) times daily.   montelukast (SINGULAIR) 10 MG tablet Take 1 tablet (10 mg total) by mouth at bedtime.   pantoprazole (PROTONIX) 40 MG tablet Take 1 tablet (40 mg total) by mouth 2 (two) times daily.   potassium chloride SA (KLOR-CON) 20 MEQ tablet Take 2 tablets (40 mEq total) by mouth 2 (two) times daily.   predniSONE (DELTASONE) 10 MG tablet Take 4 tabs po daily x 2 days; then 3 tabs for 2 days; then 2 tabs for 2 days; then 1 tab for 2 days   predniSONE (DELTASONE) 5 MG tablet Take 5 mg by mouth daily with breakfast.   promethazine-dextromethorphan (PROMETHAZINE-DM) 6.25-15 MG/5ML syrup Take 5 mLs by mouth 4 (four) times daily as needed for cough.   sildenafil (REVATIO) 20 MG tablet Take 20 mg by mouth daily as needed (erectile dysfunction).   sotalol (BETAPACE) 120 MG tablet Take 1 tablet (120 mg total) by mouth every 12 (twelve) hours.   spironolactone (ALDACTONE) 25 MG tablet Take 1 tablet (25 mg total) by mouth daily.   sucralfate (CARAFATE) 1 GM/10ML suspension Take 10 mLs (1 g total) by mouth 4 (four) times daily -  with meals and at bedtime.     Allergies:   Amlodipine besylate and Aspirin   Social History   Socioeconomic History   Marital status: Married    Spouse name: Not on file   Number of children: Not on file   Years of education: Not on file   Highest education level: Not on file  Occupational History   Not on file   Tobacco Use   Smoking status: Never   Smokeless tobacco: Never  Vaping Use  Vaping Use: Never used  Substance and Sexual Activity   Alcohol use: Not Currently    Alcohol/week: 5.0 - 7.0 standard drinks    Types: 5 - 7 Cans of beer per week    Comment: Stop drinking alcohol 3 months ago   Drug use: Never   Sexual activity: Not on file  Other Topics Concern   Not on file  Social History Narrative   Not on file   Social Determinants of Health   Financial Resource Strain: Not on file  Food Insecurity: Not on file  Transportation Needs: Not on file  Physical Activity: Not on file  Stress: Not on file  Social Connections: Not on file     Family History:  The patient's  family history includes Cardiomyopathy in his mother; Heart attack in his father.   ROS:   Please see the history of present illness.    ROS All other systems reviewed and are negative.   PHYSICAL EXAM:   VS:  BP 128/82   Pulse 80   Ht 5\' 10"  (1.778 m)   Wt 242 lb (109.8 kg)   SpO2 90%   BMI 34.72 kg/m   Physical Exam  GEN: Well nourished, well developed in no acute distress HEENT: Normal NECK: No JVD; No carotid bruits LYMPHATICS: No lymphadenopathy CARDIAC:RRR, no murmurs, rubs, gallops RESPIRATORY:  Clear to auscultation without rales, wheezing or rhonchi  ABDOMEN: Soft, non-tender, non-distended MUSCULOSKELETAL:  No edema; No deformity  SKIN: Warm and dry NEUROLOGIC:  Alert and oriented x 3 PSYCHIATRIC:  Normal affect   Wt Readings from Last 3 Encounters:  10/26/21 242 lb (109.8 kg)  10/13/21 237 lb 12.8 oz (107.9 kg)  10/12/21 235 lb 9.6 oz (106.9 kg)      Studies/Labs Reviewed:   EKG:  EKG is not ordered today.     Recent Labs: 06/06/2021: TSH 0.975 09/19/2021: NT-Pro BNP 355 10/07/2021: ALT 26; B Natriuretic Peptide 33.9 10/12/2021: BUN 22; Creatinine, Ser 1.32; Hemoglobin 9.2; Magnesium 2.0; Platelets 146; Potassium 3.2; Sodium 136   Lipid Panel    Component Value Date/Time    CHOL 123 05/27/2021 0138   CHOL 136 02/20/2021 1215   TRIG 67 05/27/2021 0138   HDL 49 05/27/2021 0138   HDL 64 02/20/2021 1215   CHOLHDL 2.5 05/27/2021 0138   VLDL 13 05/27/2021 0138   LDLCALC 61 05/27/2021 0138   LDLCALC 60 02/20/2021 1215    Additional studies/ records that were reviewed today include:  Cardiac Cath 05/29/2021 reviewed as below:   Conclusion     Prox RCA lesion is 20% stenosed. Prox Cx to Mid Cx lesion is 20% stenosed. Mid LAD lesion is 30% stenosed.   Mild non-obstructive CAD Normal right and left heart pressures   Recommendations: Medical management of mild CAD. Will resume IV heparin 6 hours post sheath pull given acute PE. Would transition to Eliquis or Xarelto tomorrow.   Echo 05/26/21 IMPRESSIONS     1. Left ventricular ejection fraction, by estimation, is 45 to 50%. The  left ventricle has mildly decreased function. The left ventricle has no  regional wall motion abnormalities. There is severe asymmetric left  ventricular hypertrophy. Left ventricular   diastolic parameters are consistent with Grade I diastolic dysfunction  (impaired relaxation).   2. Right ventricular systolic function is moderately reduced. The right  ventricular size is moderately enlarged. There is moderately elevated  pulmonary artery systolic pressure.   3. Left atrial size was moderately dilated.   4.  Right atrial size was mildly dilated.   5. The mitral valve is normal in structure. Mild mitral valve  regurgitation.   6. The aortic valve is normal in structure. Aortic valve regurgitation is  not visualized. No aortic stenosis is present.   7. Aortic dilatation noted. There is mild dilatation of the ascending  aorta, measuring 41 mm.    CT 06/07/21 IMPRESSION: 1. Bland appearing, bandlike scarring and or partial atelectasis of the dependent lungs, unchanged compared to prior examination. No evidence of fibrotic interstitial lung disease. No significant air trapping  on expiratory phase imaging. 2. Cardiomegaly and coronary artery disease. 3. Unchanged enlargement of the tubular ascending thoracic aorta, measuring up to 4.4 x 4.4 cm. Recommend annual imaging followup by CTA or MRA if not otherwise imaged. This recommendation follows 2010 ACCF/AHA/AATS/ACR/ASA/SCA/SCAI/SIR/STS/SVM Guidelines for the Diagnosis and Management of Patients with Thoracic Aortic Disease. Circulation. 2010; 121: T267-T245. Aortic aneurysm NOS (ICD10-I71.9)   Aortic Atherosclerosis (ICD10-I70.0).     Electronically Signed   By: Lauralyn Primes M.D.   On: 06/07/2021 15:12  cMRI 07/2021 IMPRESSION: 1. Asymmetric LV hypertrophy measuring 59mm in basal septum (4mm in basal lateral wall). This meets criteria for hypertrophic cardiomyopathy   2. Patchy late gadolinium enhancement at RV insertion site and basal lateral wall. LGE accounts for 1% of total myocardial mass. While this LGE pattern is consistent with HCM, would also consider Fabry disease as it can present with basal lateral LGE and LVH. Would recommend checking alpha-galactosidase A level   3. Normal LV size and systolic function (EF 52%), though was difficult to quantify volumes/EF due to significant motion artifact during acquisition   4.  Normal RV size with mild systolic dysfunction (EF 45%)   5.  Small pericardial effusion   6.  Dilated ascending aorta measuring 95mm    ASSESSMENT:    1. PFO (patent foramen ovale)   2. HOCM (hypertrophic obstructive cardiomyopathy) (HCC)   3. DOE (dyspnea on exertion)   4. Essential hypertension   5. Leg edema   6. PVC's (premature ventricular contractions)   7. Other acute pulmonary embolism without acute cor pulmonale (HCC)   8. Aneurysm of ascending aorta without rupture      PLAN:   PFO -noted to be small on recent echo with L?R shunting -doubt this is the cause of his SOB and ongoing hypoxemia but Pulmonary has no other etiology for persistent  hypoxemia -he was set up for PFO closure but cancelled due to significant PVCs -I will get him back in to see Dr. Excell Seltzer  HOCM -noted on recent cMRI done for workup fo VT -s/p AICD implant by Dr. Ladona Ridgel -I will refer to Dr. Izora Ribas for further evaluation  ASCAD -minimal on cath, R/L heart pressures normal.  -DOE out of proportion to CAD.   -continue statin and BB -no ASA due to DOAC  Dyspnea on exertion/COPD  -placed on home O2 by pulmonary for O2 sats in the 80s with walking.   -workup in progress for ongoing hypoxemia per pulmonary but no obvious cause except possible shunting with PFO -O2 sats on RA today are 90%  HTN  -Bp controlled on exam today -continue xylazine 100 mg every 8 hours, Lopressor 50 mg twice daily spironolactone 25 mg daily with as needed refills   Chronic diastolic heart failure/lower extremity edema  -suspect secondary to high salt diet.   -He does not appear volume overloaded on exam today -Does have grade 1 DD on echo but  had normal filling pressure on cath.   -Continue Lasix 40 mg daily as needed for edema and weight gain greater than 3 pounds in a day and 5 pounds in a week -I have encouraged him to weigh himself daily  Freq PVC's /VT -s/p previous ablation with recurrence and history of pseudo bradycardia related to PVCs.   -s/p AICD secondary to VT and dx of HOCM on cMRI -continue sotalol 120 mg twice daily and Lopressor 50 mg twice daily  Acute PE -followed by Pulmonary -small and not felt to be cause of persistent hypoxemia -continue DOAC  Thoracic aortic aneurysm  -4.4 cm on CT angio 06/06/2021 -followed with yearly CTA  Shared Decision Making/Informed Consent        Medication Adjustments/Labs and Tests Ordered: Current medicines are reviewed at length with the patient today.  Concerns regarding medicines are outlined above.  Medication changes, Labs and Tests ordered today are listed in the Patient Instructions below. There are  no Patient Instructions on file for this visit.    Signed, [name], MD  10/26/2021 1:17 PM    [facility] [place], [place], [place]  [address] Phone: [phone]; Fax: [phone]

## 2021-10-26 NOTE — Progress Notes (Signed)
Spoke with the pt and notified of results and he verbalized understanding. I offered to make his f/u appt and he declined bc he was not able to at the time and stated he would call back to scheduled 2-4 wk rov with JD.

## 2021-10-26 NOTE — Telephone Encounter (Signed)
Called patient to see if he is interested in the Pulmonary Rehab Program. Patient expressed interest. Explained scheduling process, patient verbalized understanding. Also adv pt where we are with scheduling for PR and that we have a backlog of 1-4 months. 

## 2021-10-27 ENCOUNTER — Telehealth: Payer: Self-pay

## 2021-10-27 NOTE — Telephone Encounter (Signed)
Per Dr. Mayford Knife, called to arrange visit with Dr. Excell Seltzer to rediscuss PFO.  Left message to call back.

## 2021-10-28 NOTE — Telephone Encounter (Signed)
Patient contacted via telephone stated today not a good day for him.  Body aches and not feeling great had gone to [employer] and told wife they had to leave because not feeling well.  Got home and checked sp02 high 80s RA.  When speaking to me on phone now 92%  spo2 RA sitting in recliner Weight 238lbs this am.  Patient taking 4 potassium pills Last potassium level at [facility] 5.4.  Drinking his usual amount of water from yeti cup as any other day.  Discussed his first week back at work.  Extended his day every day more than 4 hours, walked dog.  Still anemic.  Denied palpitations today.  Discussed cold air harder to get oxygen out of air into lung blood vessels as temperatures freezing mornings and nights also constricts blood vessels.  Patient stated he did get heating pad and it does help when he uses on knees at home.  Discussed rest this weekend, hydrate, eat healthy meals as he is still recovering from GI bleed also.  Patient stated he tried to get BMET drawn on Friday with RN Rolly Salter but she was teaching CPR.  Will need BMET drawn Monday.  RN Rolly Salter to fit and give him neoprene knee sleeve from clinic stock Monday also.  Discussed alternative pain medication is pepper cream pain blocks pain signals to nerves can help and doesn't affect blood clotting if knee sleeve/heat/tylenol not sufficient can try.  Discussed I would send in Rx to [facility] for him next week if neoprene knee sleeves keeping heat/compression/support on painful areas not helping enough.  Patient to do covid test tomorrow if still having body aches.  Patient A&Ox3 spoke full sentences without difficulty no cough/congestion/throat clearing audible during 17 minute telephone call.  He stated follow up with Dr Excell Seltzer pending for PFO closure re-evaluation.  He also has appt for follow up with Dr Francine Graven.  Discussed I would call him again tomorrow afternoon but if questions or concerns I am available for telephone consult.  Discussed at church  1230-1500 teaching tomorrow.  Patient verbalized understanding information/instructions, agreed with plan of care and had no further questions at this time.

## 2021-10-29 ENCOUNTER — Encounter (HOSPITAL_COMMUNITY): Payer: Self-pay | Admitting: *Deleted

## 2021-10-29 NOTE — Progress Notes (Signed)
Received referral from Dr. Francine Graven for this pt to participate in pulmonary rehab with the diagnosis of Chronic respiratory failure with Hypoxia. Clinical review of pt follow up appt on 11/4 with Ames Dura NP with Dr. Francine Graven Pulmonary office note.  Also reviewed progress notes in [facility] Pt with Covid Risk Score -  10. Pt appropriate for scheduling for Pulmonary rehab.   Will forward to support staff for scheduling  as there is a wait list and verification. Alanson Aly, BSN Cardiac and Emergency planning/management officer

## 2021-10-29 NOTE — Telephone Encounter (Signed)
Patient contacted via telephone  Feeling a little better but still having muscle aches.  Denied fever or chills but feeling cold today.  Outside temps 40s.  Stated sp02 90% this am and 93% currently RA.  242lbs weight this am.  No lasix today.  Patient has pulmonology appt 11/22 with Dr Francine Graven.  Patient stated he feels better after sleeping with oxygen connected to cpap.  Patient also stated letter came in the mail new rheumatology medication not approved and he must be tried on another first.  He is going to contact rheumatology tomorrow to follow up medication due to knee pain.  Covid home test negative.  Discussed bone aches could be from arthritis or bone marrow working to produce new blood cells after GI bleed.  Patient not eating meat daily consider vitamin with iron until CBC normal/iron level normal  Discussed epsom salt steam bath may also help him to feel better.  If URI symptoms with fever to notify me and repeat covid test and if negative will send in flu antivirals for patient.  Discussed flu is circulating in community.  Discussed aches/pain exacerbated had to stop rheumatology meds/nsaids/steroids and only on tylenol for pain at this time.  Restarted work last week also after two hospitalizations/out on disability for weeks.  Patient had me on speaker phone so spouse could listen/ask questions also.  Patient and spouse verbalized understanding information/instructions, agreed with plan of care and had no further questions at this time.  Patient A&Ox3 spoke full sentences without difficulty no wheezing/shortness of breath/cough/congestion or throat clearing audible during 10 minute telephone call.

## 2021-10-30 NOTE — Telephone Encounter (Signed)
Pt contacted in regards to scheduling follow-up with Dr Excell Seltzer.  Appointment scheduled on 12/7 at 3:40 PM.

## 2021-10-31 ENCOUNTER — Ambulatory Visit (INDEPENDENT_AMBULATORY_CARE_PROVIDER_SITE_OTHER): Payer: No Typology Code available for payment source | Admitting: Pulmonary Disease

## 2021-10-31 ENCOUNTER — Encounter: Payer: Self-pay | Admitting: Pulmonary Disease

## 2021-10-31 ENCOUNTER — Ambulatory Visit: Payer: Self-pay | Admitting: *Deleted

## 2021-10-31 ENCOUNTER — Other Ambulatory Visit: Payer: Self-pay

## 2021-10-31 VITALS — BP 114/78 | HR 82 | Temp 98.3°F | Ht 70.0 in | Wt 241.0 lb

## 2021-10-31 DIAGNOSIS — J4520 Mild intermittent asthma, uncomplicated: Secondary | ICD-10-CM | POA: Diagnosis not present

## 2021-10-31 DIAGNOSIS — Q2112 Patent foramen ovale: Secondary | ICD-10-CM

## 2021-10-31 DIAGNOSIS — I2699 Other pulmonary embolism without acute cor pulmonale: Secondary | ICD-10-CM

## 2021-10-31 DIAGNOSIS — R0609 Other forms of dyspnea: Secondary | ICD-10-CM

## 2021-10-31 DIAGNOSIS — G4733 Obstructive sleep apnea (adult) (pediatric): Secondary | ICD-10-CM

## 2021-10-31 DIAGNOSIS — R600 Localized edema: Secondary | ICD-10-CM

## 2021-10-31 DIAGNOSIS — I421 Obstructive hypertrophic cardiomyopathy: Secondary | ICD-10-CM

## 2021-10-31 DIAGNOSIS — I493 Ventricular premature depolarization: Secondary | ICD-10-CM

## 2021-10-31 DIAGNOSIS — I10 Essential (primary) hypertension: Secondary | ICD-10-CM

## 2021-10-31 DIAGNOSIS — I7121 Aneurysm of the ascending aorta, without rupture: Secondary | ICD-10-CM

## 2021-10-31 NOTE — Progress Notes (Signed)
Synopsis: Referred by Rolan Lipa, MD for post-covid 19 dyspnea  Subjective:   PATIENT ID: Ryan Glass GENDER: male DOB: 1956-09-13, MRN: [id number]  HPI  Chief Complaint  Patient presents with   Follow-up    Patient says he's still having trouble with his breathing   Angeldaniel Pepitone is a 65 year old male, never smoker with hypertension, rheumatoid arthritis and obstructive sleep apnea on CPAP who returns to pulmonary clinic shortness of breath.   He continues to have exertional shortness of breath. He has not started cardiopulmonary rehab yet. He is having a hard time being able to complete his work as he returned recently. He complains of being tired and fatiguing easily.   He remains on advair 100-31mcg 1 puff twice daily as he reports benefit from the inhaler.   He remains off supplemental oxygen at this time.   He has not had CPAP titration study.  OV 09/28/21 Patient is feeling better since last visit and he has been transition to sotalol therapy and remains off carvedilol and diltiazem due to bradycardia.  He has been followed closely by his cardiology team and seems to be in a better spot at this time regarding his cardiac function.  He continues to have shortness of breath with exertion though.  He has been taken off supplemental oxygen therapy based on his recent clinic checks with no desaturations upon ambulation.  OV 09/05/21 He was admitted in June 2022 for progressive dyspnea and chest pain where he was found to have a segmental pulmonary emboli after traveling to [place] and having dental implant surgery done. He was treated with heparin and then transitioned to eliquis. He was discharged home on supplemental oxygen as he had desaturations in his SpO2 at that time.   Echo 05/26/21 showed LVEF 45-50% (down from 60-65% 02/08/21) with severe asymetric left ventricular hypertrophy. Grade I diastolic dysfunction. RV systolic function is moderately reduced and RV size is  moderately enlarged with RVSP 50.27mmHg. His echo on 02/08/21 previously showed normal RV function and mildly enlarged size. There is dilation in the RA and LA on both the 6/17 and 3/2 echos.   [place] 05/29/2021 showed minimal CAD and RHC showed normal pressures. RV 26/1, PA 25/4, PA mean 14, PW mean 12, LVEDP 8.  HRCT Chest on 06/07/21 shows band like scarring or partial atelectasis of the dependent lungs which is unchanged from prior studies. No air trapping noted. No subpleural reticulation noted.   PFTs 11/2020 normal ratio, no airway obstruction, no restriction, DLCO 71%. Repeat PFTs 08/01/21 show normal ratio, no obstruction or restriction and DLCO 89%.   He again had oxygen desaturations and required 2L of supplemental oxygen with ambulation at last visit on 07/20/21.   He was admitted 08/02/21 for concerns of ventricular trachycardia noted on his Zio extended cardiac monitor. TTE bubble study on 08/03/21 showed positive shunting within 3-6 cardiac cycles suggestive of interatrial shunt. The echo was also concerning for hypertrophic cardiomyopathy. He under went ICD placement on 08/07/21. He was discharged home with carvedilol and verapamil for rate control medications.  Discussions were held between his cardiology team and myself regarding his dyspnea out of proportion to his testing results in regards to his HRCT chest scan and recent PFTs. We decided to move forward with closure of his PFO as he had shunt activity noted on TTE and TEE.  Today in clinic patient did not have desaturations in his SpO2 which is similar to Dr. Antionette Char findings in clinic  on 08/24/21. The patient walked a total of 5 laps without decline in his O2 today. Patient's heart rate did drop into the 40s with increased activity. Patient has HR, BP and SpO2 diary which indicates he has had a heart rate in the 30-40s since 9/16-9/17. He does complain of dizziness and feeling light headed at times.   Past Medical History:  Diagnosis Date    Ascending aortic aneurysm    a. CT 05/2021 showed 4.4x4.4cm TAA.   Bilateral lower extremity edema    Chronic gout without tophus    followed by dr Sharmon Revere    (06-08-2020  per pt last episode right knee 3 wks ago)   CKD (chronic kidney disease), stage II    nephrology--- [facility] (10-29-2019 note in epic scanned in  media)   Heart failure with mid-range ejection fraction [facility]) 05/27/2021   05/26/21: Left ventricular ejection fraction, by estimation, is 45 to 50%. There is severe asymmetric left ventricular hypertrophy. G1DD present.    History of COVID-19 06/07/2021   HOCM (hypertrophic obstructive cardiomyopathy) (HCC)    s/p ICD 07/2021   Hypertension    followed by cardiology, dr t. turner   (05-14-2018 nuclear study in epic , normal perfusion with nuclear ef 61%)   Left hydrocele    Low serum potassium level 04/27/2014   OSA on CPAP    per pt uses every night   Palpitations followed by dr t. Mayford Knife   06-08-2020  still feels palipations due to PVCs when exertion but not with chest pain/ discomfort   Pneumonia due to COVID-19 virus    PVC's (premature ventricular contractions) cardiologist--- dr t. turner   Status post PVC ablation by Dr. Ladona Ridgel 2019 with recurrence of frequent PVCs/bigeminy;  prior pseudobradycardia r/t pvcs   Rheumatoid arthritis involving multiple sites [facility])    rheumotology--- dr a. Sharmon Revere  ([facility] in [place])     Family History  Problem Relation Age of Onset   Cardiomyopathy Mother    Heart attack Father      Social History   Socioeconomic History   Marital status: Married    Spouse name: Not on file   Number of children: Not on file   Years of education: Not on file   Highest education level: Not on file  Occupational History   Not on file  Tobacco Use   Smoking status: Never   Smokeless tobacco: Never  Vaping Use   Vaping Use: Never used  Substance and Sexual Activity   Alcohol use: Not Currently    Alcohol/week: 5.0 - 7.0  standard drinks    Types: 5 - 7 Cans of beer per week    Comment: Stop drinking alcohol 3 months ago   Drug use: Never   Sexual activity: Not on file  Other Topics Concern   Not on file  Social History Narrative   Not on file   Social Determinants of Health   Financial Resource Strain: Not on file  Food Insecurity: Not on file  Transportation Needs: Not on file  Physical Activity: Not on file  Stress: Not on file  Social Connections: Not on file  Intimate Partner Violence: Not on file     Allergies  Allergen Reactions   Amlodipine Besylate Swelling and Other (See Comments)    Leg swelling with 10 mg daily am dosing; decreased with BID 5 mg dosing   Aspirin Itching     Outpatient Medications Prior to Visit  Medication Sig Dispense Refill  Acetaminophen-Codeine 300-30 MG tablet Take 1-2 tablets by mouth every 12 (twelve) hours as needed for pain.     albuterol (VENTOLIN HFA) 108 (90 Base) MCG/ACT inhaler Inhale 2 puffs into the lungs every 4 (four) hours as needed for wheezing or shortness of breath. 8 g 2   apixaban (ELIQUIS) 5 MG TABS tablet Take 1 tablet (5 mg total) by mouth 2 (two) times daily. 60 tablet 5   atorvastatin (LIPITOR) 40 MG tablet Take 1 tablet (40 mg total) by mouth daily. (Patient taking differently: Take 40 mg by mouth at bedtime.) 90 tablet 3   colchicine 0.6 MG tablet Take 0.6-1.2 mg by mouth daily as needed (for gout flares).     fluticasone (FLONASE) 50 MCG/ACT nasal spray Place 1 spray into both nostrils daily. (Patient taking differently: Place 2 sprays into both nostrils at bedtime.)     folic acid (FOLVITE) 1 MG tablet Take 1 mg by mouth daily.     furosemide (LASIX) 40 MG tablet Take 1 tablet (40 mg total) by mouth daily as needed (weight gain). Take 1 tablet by mouth daily for 3 days, then decrease to only as needed for increase weight gain 30 tablet 3   gabapentin (NEURONTIN) 600 MG tablet Take 600 mg by mouth with breakfast, with lunch, and with  evening meal.     hydrALAZINE (APRESOLINE) 100 MG tablet Take 1 tablet (100 mg total) by mouth every 8 (eight) hours. 90 tablet 6   Magnesium Oxide 400 MG CAPS Take 1 capsule (400 mg total) by mouth daily. 90 capsule 3   metoprolol tartrate (LOPRESSOR) 50 MG tablet Take 1 tablet (50 mg total) by mouth 2 (two) times daily. 60 tablet 1   montelukast (SINGULAIR) 10 MG tablet Take 1 tablet (10 mg total) by mouth at bedtime. 90 tablet 3   pantoprazole (PROTONIX) 40 MG tablet Take 1 tablet (40 mg total) by mouth 2 (two) times daily. 60 tablet 1   potassium chloride SA (KLOR-CON) 20 MEQ tablet Take 2 tablets (40 mEq total) by mouth 2 (two) times daily. 120 tablet 6   predniSONE (DELTASONE) 5 MG tablet Take 5 mg by mouth daily with breakfast.     promethazine-dextromethorphan (PROMETHAZINE-DM) 6.25-15 MG/5ML syrup Take 5 mLs by mouth 4 (four) times daily as needed for cough. 118 mL 0   sildenafil (REVATIO) 20 MG tablet Take 20 mg by mouth daily as needed (erectile dysfunction).     sotalol (BETAPACE) 120 MG tablet Take 1 tablet (120 mg total) by mouth every 12 (twelve) hours. 60 tablet 6   spironolactone (ALDACTONE) 25 MG tablet Take 1 tablet (25 mg total) by mouth daily. 30 tablet 6   sucralfate (CARAFATE) 1 GM/10ML suspension Take 10 mLs (1 g total) by mouth 4 (four) times daily -  with meals and at bedtime. 420 mL 0   cefdinir (OMNICEF) 300 MG capsule Take 1 capsule twice daily for additional three days 6 capsule 0   HUMIRA PEN 40 MG/0.4ML PNKT Inject 40 mg into the skin every 14 (fourteen) days.     predniSONE (DELTASONE) 10 MG tablet Take 4 tabs po daily x 2 days; then 3 tabs for 2 days; then 2 tabs for 2 days; then 1 tab for 2 days 20 tablet 0   methotrexate 2.5 MG tablet Take 15 mg by mouth every Sunday. (Patient not taking: Reported on 10/31/2021)     No facility-administered medications prior to visit.   Review of Systems  Constitutional:  Negative for chills, fever, malaise/fatigue and weight  loss.  HENT:  Negative for congestion and sore throat.   Eyes: Negative.   Respiratory:  Positive for shortness of breath. Negative for cough, hemoptysis, sputum production and wheezing.   Cardiovascular:  Negative for chest pain, palpitations, orthopnea, claudication and leg swelling.  Gastrointestinal:  Negative for abdominal pain, heartburn, nausea and vomiting.  Genitourinary: Negative.   Musculoskeletal: Negative.   Neurological:  Negative for dizziness, weakness and headaches.  Endo/Heme/Allergies: Negative.   Psychiatric/Behavioral: Negative.     Objective:   Vitals:   10/31/21 1617  BP: 114/78  Pulse: 82  Temp: 98.3 F (36.8 C)  TempSrc: Oral  SpO2: 97%  Weight: 241 lb (109.3 kg)  Height: 5\' 10"  (1.778 m)    Physical Exam Constitutional:      General: He is not in acute distress.    Appearance: He is obese.  HENT:     Head: Normocephalic and atraumatic.  Eyes:     Conjunctiva/sclera: Conjunctivae normal.  Cardiovascular:     Rate and Rhythm: Normal rate and regular rhythm.     Pulses: Normal pulses.     Heart sounds: Normal heart sounds. No murmur heard. Pulmonary:     Effort: Pulmonary effort is normal.     Breath sounds: Normal breath sounds. No wheezing, rhonchi or rales.  Abdominal:     General: Bowel sounds are normal.     Palpations: Abdomen is soft.  Musculoskeletal:     Right lower leg: No edema.     Left lower leg: No edema.  Skin:    General: Skin is warm and dry.  Neurological:     General: No focal deficit present.     Mental Status: He is alert.  Psychiatric:        Mood and Affect: Mood normal.        Behavior: Behavior normal.        Thought Content: Thought content normal.        Judgment: Judgment normal.    CBC    Component Value Date/Time   WBC 4.0 10/12/2021 0257   RBC 2.73 (L) 10/12/2021 0257   HGB 9.2 (L) 10/12/2021 0257   HGB 13.8 08/15/2021 1052   HCT 27.1 (L) 10/12/2021 0257   HCT 40.4 08/15/2021 1052   PLT 146 (L)  10/12/2021 0257   PLT 193 08/15/2021 1052   MCV 99.3 10/12/2021 0257   MCV 99 (H) 08/15/2021 1052   MCH 33.7 10/12/2021 0257   MCHC 33.9 10/12/2021 0257   RDW 14.6 10/12/2021 0257   RDW 13.6 08/15/2021 1052   LYMPHSABS 1.1 10/07/2021 1031   LYMPHSABS 0.8 02/20/2021 1215   MONOABS 0.3 10/07/2021 1031   EOSABS 0.0 10/07/2021 1031   EOSABS 0.0 02/20/2021 1215   BASOSABS 0.0 10/07/2021 1031   BASOSABS 0.0 02/20/2021 1215   BMP Latest Ref Rng & Units 10/31/2021 10/12/2021 10/11/2021  Glucose 70 - 99 mg/dL 94 91 95  BUN 8 - 27 mg/dL 16 22 25(H)  Creatinine 0.76 - 1.27 mg/dL 1.37(H) 1.32(H) 1.45(H)  BUN/Creat Ratio 10 - 24 12 - -  Sodium 134 - 144 mmol/L 139 136 132(L)  Potassium 3.5 - 5.2 mmol/L 4.5 3.2(L) 3.5  Chloride 96 - 106 mmol/L 106 106 103  CO2 20 - 29 mmol/L 16(L) 23 22  Calcium 8.6 - 10.2 mg/dL 9.6 8.1(L) 7.9(L)   Chest imaging: HRCT Chest 06/07/21 1. Bland appearing, bandlike scarring and or partial atelectasis of the dependent  lungs, unchanged compared to prior examination. No evidence of fibrotic interstitial lung disease. No significant air trapping on expiratory phase imaging. 2. Cardiomegaly and coronary artery disease. 3. Unchanged enlargement of the tubular ascending thoracic aorta, measuring up to 4.4 x 4.4 cm. Recommend annual imaging followup by CTA or MRA if not otherwise imaged.  CXR 07/12/20 There are low lung volumes with central vascular crowding and additional bandlike opacities in the periphery of the left mid lung which could reflect subsegmental atelectasis or scarring. More patchy right infrahilar opacity with airways thickening is present. No pneumothorax or visible effusion. Cardiomediastinal contours are unremarkable. No acute osseous or soft tissue abnormality. Degenerative changes are present in the imaged spine and shoulders. Telemetry leads overlie the chest.  PFT: PFT Results Latest Ref Rng & Units 08/01/2021 11/16/2020  FVC-Pre L 3.75 4.14   FVC-Predicted Pre % 84 92  FVC-Post L 3.65 4.01  FVC-Predicted Post % 82 89  Pre FEV1/FVC % % 78 82  Post FEV1/FCV % % 82 85  FEV1-Pre L 2.91 3.38  FEV1-Predicted Pre % 87 101  FEV1-Post L 2.99 3.40  DLCO uncorrected ml/min/mmHg 23.24 18.55  DLCO UNC% % 89 71  DLCO corrected ml/min/mmHg 25.93 18.55  DLCO COR %Predicted % 99 71  DLVA Predicted % 100 86  TLC L 6.95 7.23  TLC % Predicted % 102 106  RV % Predicted % 126 121  PFT 2021: Mild diffusion defect.  PFT 2022: within normal limits  Echo:  08/18/21 - TEE LVEF 55-60%. Mild left ventricular hypertrophy. RV systolic function is moderately reduced. RV is moderately enlarged. Atrial level shunt noted by color flow and agitated saline. RA mildly dilated.   05/26/21 LVEF 45 to 50%.  Severe asymmetric left ventricular hypertrophy.  Grade 1 diastolic dysfunction.  RV systolic function is moderately reduced.  RV size is moderately enlarged.  Moderately elevated pulmonary arterial systolic pressure.  Left atrium size is moderately dilated.  Right atrial size is mildly dilated.  02/08/21 LVEF 60-65%. Moderate LVH. RV systolic function is normal. RV is mildly enlarged.      Assessment & Plan:   Mild intermittent reactive airway disease without complication  OSA (obstructive sleep apnea)  Discussion: Keiler Noria is a 65 year old male, never smoker with hypertension, rheumatoid arthritis reactive airways disease and obstructive sleep apnea on CPAP who returns to pulmonary clinic for shortness of breath.   He continues to have ongoing exertional shortness of breath which is likely due to his heart failure and deconditioning at this time.  We will continue ICS/LABA inhaler therapy for reactive airways disease as he does report intermittent wheezing and benefit from these inhalers.  He needs to have a CPAP titration study performed to ensure he is on adequate sleep apnea treatment. This will have significant cardiovascular benefit.   He  will benefit from cardiopulmonary rehab and is currently on the waiting list.  He has a stationary bike and elliptical machine at home I encouraged him to start working on increasing his physical activity slowly.  He does not have a need for supplemental oxygen so I do not feel PFO closure is needed at this time.  [name], MD [facility] Office: [phone]     Current Outpatient Medications:    Acetaminophen-Codeine 300-30 MG tablet, Take 1-2 tablets by mouth every 12 (twelve) hours as needed for pain., Disp: , Rfl:    albuterol (VENTOLIN HFA) 108 (90 Base) MCG/ACT inhaler, Inhale 2 puffs into the  lungs every 4 (four) hours as needed for wheezing or shortness of breath., Disp: 8 g, Rfl: 2   apixaban (ELIQUIS) 5 MG TABS tablet, Take 1 tablet (5 mg total) by mouth 2 (two) times daily., Disp: 60 tablet, Rfl: 5   atorvastatin (LIPITOR) 40 MG tablet, Take 1 tablet (40 mg total) by mouth daily. (Patient taking differently: Take 40 mg by mouth at bedtime.), Disp: 90 tablet, Rfl: 3   colchicine 0.6 MG tablet, Take 0.6-1.2 mg by mouth daily as needed (for gout flares)., Disp: , Rfl:    fluticasone (FLONASE) 50 MCG/ACT nasal spray, Place 1 spray into both nostrils daily. (Patient taking differently: Place 2 sprays into both nostrils at bedtime.), Disp: , Rfl:    folic acid (FOLVITE) 1 MG tablet, Take 1 mg by mouth daily., Disp: , Rfl:    furosemide (LASIX) 40 MG tablet, Take 1 tablet (40 mg total) by mouth daily as needed (weight gain). Take 1 tablet by mouth daily for 3 days, then decrease to only as needed for increase weight gain, Disp: 30 tablet, Rfl: 3   gabapentin (NEURONTIN) 600 MG tablet, Take 600 mg by mouth with breakfast, with lunch, and with evening meal., Disp: , Rfl:    hydrALAZINE (APRESOLINE) 100 MG tablet, Take 1 tablet (100 mg total) by mouth every 8 (eight) hours., Disp: 90 tablet, Rfl: 6   Magnesium Oxide 400 MG CAPS, Take 1 capsule (400 mg total) by  mouth daily., Disp: 90 capsule, Rfl: 3   metoprolol tartrate (LOPRESSOR) 50 MG tablet, Take 1 tablet (50 mg total) by mouth 2 (two) times daily., Disp: 60 tablet, Rfl: 1   montelukast (SINGULAIR) 10 MG tablet, Take 1 tablet (10 mg total) by mouth at bedtime., Disp: 90 tablet, Rfl: 3   pantoprazole (PROTONIX) 40 MG tablet, Take 1 tablet (40 mg total) by mouth 2 (two) times daily., Disp: 60 tablet, Rfl: 1   potassium chloride SA (KLOR-CON) 20 MEQ tablet, Take 2 tablets (40 mEq total) by mouth 2 (two) times daily., Disp: 120 tablet, Rfl: 6   predniSONE (DELTASONE) 5 MG tablet, Take 5 mg by mouth daily with breakfast., Disp: , Rfl:    promethazine-dextromethorphan (PROMETHAZINE-DM) 6.25-15 MG/5ML syrup, Take 5 mLs by mouth 4 (four) times daily as needed for cough., Disp: 118 mL, Rfl: 0   sildenafil (REVATIO) 20 MG tablet, Take 20 mg by mouth daily as needed (erectile dysfunction)., Disp: , Rfl:    sotalol (BETAPACE) 120 MG tablet, Take 1 tablet (120 mg total) by mouth every 12 (twelve) hours., Disp: 60 tablet, Rfl: 6   spironolactone (ALDACTONE) 25 MG tablet, Take 1 tablet (25 mg total) by mouth daily., Disp: 30 tablet, Rfl: 6   sucralfate (CARAFATE) 1 GM/10ML suspension, Take 10 mLs (1 g total) by mouth 4 (four) times daily -  with meals and at bedtime., Disp: 420 mL, Rfl: 0   Abatacept (ORENCIA CLICKJECT) 0000000 MG/ML SOAJ, Inject into the skin once a week., Disp: , Rfl:    fluticasone-salmeterol (ADVAIR) 100-50 MCG/ACT AEPB, INHALE ONE PUFF BY MOUTH TWICE A DAY, Disp: 60 each, Rfl: 0

## 2021-10-31 NOTE — Progress Notes (Signed)
Nonfasting lab drawn successfully.

## 2021-10-31 NOTE — Patient Instructions (Signed)
Recommend checking Vitamin B12 and Vitamin D levels for your fatigue. Your vitamin B12 level was low in 05/2021 at 146.   Recommend getting the CPAP titration study checked.

## 2021-11-01 ENCOUNTER — Encounter: Payer: Self-pay | Admitting: Registered Nurse

## 2021-11-01 LAB — BASIC METABOLIC PANEL
BUN/Creatinine Ratio: 12 (ref 10–24)
BUN: 16 mg/dL (ref 8–27)
CO2: 16 mmol/L — ABNORMAL LOW (ref 20–29)
Calcium: 9.6 mg/dL (ref 8.6–10.2)
Chloride: 106 mmol/L (ref 96–106)
Creatinine, Ser: 1.37 mg/dL — ABNORMAL HIGH (ref 0.76–1.27)
Glucose: 94 mg/dL (ref 70–99)
Potassium: 4.5 mmol/L (ref 3.5–5.2)
Sodium: 139 mmol/L (ref 134–144)
eGFR: 57 mL/min/{1.73_m2} — ABNORMAL LOW (ref >59)

## 2021-11-01 LAB — SPECIMEN STATUS REPORT
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
## 2021-11-02 ENCOUNTER — Other Ambulatory Visit: Payer: Self-pay | Admitting: Primary Care

## 2021-11-05 NOTE — Telephone Encounter (Signed)
Patient returned call stated he did get one neoprene knee sleeve from clinic RN and it is helping but doesn't stop all knee pain.  He is wondering if he can use topical diclofenac as wife noticed gel available.  Discussed with patient still risk of GI bleed with topical diclofenac per research studies on NIH website up to 10% patients studied.  Discussed with patient can take OTC tylenol 325mg  (3) every 6 hours or 500mg  (2) every 6 hours when he is not taking his tylenol #3.  Discussed if taking one tylenol #3 may only take 1 500 or 2 325mg  plain tylenol with it.   If he is taking 2 tylenol #3 then he can only add 1 tylenol 325mg .  We have unit dose 325mg  in clinic if he needs to get some as [facility] only selling 500mg  in stock per website for [facility] location.   Patient stated he will buy at [facility] this weekend.  Discussed with patient I would check what is in stock online for multivitamin and acetaminophen (tylenol generic) and send him email.  Patient not currently taking multivitamin or iron eating regular diet with meat.  Patient reported he stopped alcohol intake after trip to [place].  Patient stated after working until 2pm yesterday 11/25 his knees hurt so bad he was barely able to walk to car in parking lot.  He considered using a wheelchair.  Patient stated his knees stiff and walking very slow.  He is concerned about pain since starting 11/06/21 full shifts at work again.  Discussed with patient to see RN tomorrow to get a second knee sleeve.  Take tylenol as discussed above every 6 hours  Discussed with patient to rest this weekend, elevate legs, heating pad or epsom salt bath soat.  Discussed capsaicin cream again is an option if tylenol not providing enough relief.  Patient is following up with rheumatology since he was notified new medication authorization for was denied by insurance.  Patient received letter in the mail this week.  Noted in Epic patient has telephone call in to  rheumatology for pain medication on 11/03/21.  Patient also discussed his pulmonology appt with Dr [place].  B12 and Vitamin D levels ordered for patient and he can have them drawn with RN 12/25 when next labs due.  Patient also asked about low CO2 level on last BMET. Discussed with patient chronic kidney disease/anemia most likely factoring into low CO2 level.  Per up to date acetaminophen intake could also factor into acidosis.  Patient worked longer hours this week consider lactic acidosis also.   Duration of telephone call 16 minutes Patient A&Ox3 spoke full sentences without difficulty no cough/congestion/throat clearing/wheezing/shortness of breath audible during telephone call.  Patient verbalized understanding information/instructions, agreed with plan of care and had no further questions at this time.

## 2021-11-08 ENCOUNTER — Ambulatory Visit (INDEPENDENT_AMBULATORY_CARE_PROVIDER_SITE_OTHER): Payer: No Typology Code available for payment source

## 2021-11-08 ENCOUNTER — Other Ambulatory Visit: Payer: Self-pay | Admitting: Registered Nurse

## 2021-11-08 DIAGNOSIS — J4541 Moderate persistent asthma with (acute) exacerbation: Secondary | ICD-10-CM

## 2021-11-08 DIAGNOSIS — R232 Flushing: Secondary | ICD-10-CM

## 2021-11-08 DIAGNOSIS — I421 Obstructive hypertrophic cardiomyopathy: Secondary | ICD-10-CM

## 2021-11-08 DIAGNOSIS — Z95810 Presence of automatic (implantable) cardiac defibrillator: Secondary | ICD-10-CM | POA: Diagnosis not present

## 2021-11-08 LAB — CUP PACEART REMOTE DEVICE CHECK
Battery Remaining Longevity: 56 mo
Battery Remaining Percentage: 93 %
Battery Voltage: 2.99 V
Brady Statistic AP VP Percent: 44 %
Brady Statistic AP VS Percent: 52 %
Brady Statistic AS VP Percent: 1 %
Brady Statistic AS VS Percent: 2.9 %
Brady Statistic RA Percent Paced: 92 %
Brady Statistic RV Percent Paced: 44 %
Date Time Interrogation Session: 20221130012134
HighPow Impedance: 59 Ohm
Implantable Lead Implant Date: 20220829
Implantable Lead Implant Date: 20220829
Implantable Lead Location: 753859
Implantable Lead Location: 753860
Implantable Pulse Generator Implant Date: 20220829
Lead Channel Impedance Value: 440 Ohm
Lead Channel Impedance Value: 440 Ohm
Lead Channel Pacing Threshold Amplitude: 0.5 V
Lead Channel Pacing Threshold Amplitude: 0.5 V
Lead Channel Pacing Threshold Pulse Width: 0.5 ms
Lead Channel Pacing Threshold Pulse Width: 0.5 ms
Lead Channel Sensing Intrinsic Amplitude: 12 mV
Lead Channel Sensing Intrinsic Amplitude: 2.7 mV
Lead Channel Setting Pacing Amplitude: 3.5 V
Lead Channel Setting Pacing Amplitude: 3.5 V
Lead Channel Setting Pacing Pulse Width: 0.5 ms
Lead Channel Setting Sensing Sensitivity: 0.5 mV
Pulse Gen Serial Number: 810029878

## 2021-11-08 NOTE — Telephone Encounter (Signed)
Dr Francine Graven recommended patient continue LABA/ICS  Refill electronic Rx sent to his pharmacy of choice and will follow up with patient 11/09/21 at worksite clinic.

## 2021-11-08 NOTE — Telephone Encounter (Signed)
Secure message sent to patient pulmonary provider Dr Francine Graven verifying if patient inhaler should continue or be stopped completely.

## 2021-11-09 NOTE — Telephone Encounter (Signed)
Patient returned call stated he did pick up other knee neoprene knee sleeve and helping.  Taking tylenol #3 in am with 1 tylenol 500mg , started multivitamin.  Questions on how much and how often he can take of tylenol again.  Discussed if taking one Tylenol #3 can take 1 tylenol 500mg  OTC at 0600 then repeat tylenol 500mg  2 tabs at 1200, 1600 and 2200 hours.  Patient stated also still applying heat and it helps also but in afternoon with only Tylenol #3 and 1 tylenol 500mg  knees hurting a lot in afternoon/evening with no redosing.  Discussed max dosing is 2 - 500mg  tylenol every 6 hours and if taking tylenol #3 have to decrease to 1 tab for that 6 hour period.  Patient stated  received letter in mail that new Rx rheumatology recommended was approved by insurance now and expecting delivery to start it tomorrow.   Patient also asked to verify if inhaler refill is formulary and per [facility] 2022 and 2023 January formulary received from insurance it is Tier 1 for fluticasone salmeterol.   Patient verbalized understanding information/instructions, agreed with plan of care and had no further questions at this time.

## 2021-11-13 ENCOUNTER — Telehealth: Payer: Self-pay | Admitting: *Deleted

## 2021-11-13 ENCOUNTER — Ambulatory Visit (INDEPENDENT_AMBULATORY_CARE_PROVIDER_SITE_OTHER): Payer: No Typology Code available for payment source | Admitting: Internal Medicine

## 2021-11-13 ENCOUNTER — Other Ambulatory Visit: Payer: Self-pay

## 2021-11-13 ENCOUNTER — Encounter: Payer: Self-pay | Admitting: *Deleted

## 2021-11-13 ENCOUNTER — Encounter: Payer: Self-pay | Admitting: Internal Medicine

## 2021-11-13 VITALS — BP 130/82 | HR 80 | Ht 70.0 in | Wt 245.2 lb

## 2021-11-13 DIAGNOSIS — I10 Essential (primary) hypertension: Secondary | ICD-10-CM | POA: Diagnosis not present

## 2021-11-13 DIAGNOSIS — I421 Obstructive hypertrophic cardiomyopathy: Secondary | ICD-10-CM

## 2021-11-13 DIAGNOSIS — Z0189 Encounter for other specified special examinations: Secondary | ICD-10-CM

## 2021-11-13 DIAGNOSIS — Q2112 Patent foramen ovale: Secondary | ICD-10-CM

## 2021-11-13 DIAGNOSIS — I472 Ventricular tachycardia, unspecified: Secondary | ICD-10-CM

## 2021-11-13 DIAGNOSIS — I7121 Aneurysm of the ascending aorta, without rupture: Secondary | ICD-10-CM

## 2021-11-13 NOTE — Telephone Encounter (Signed)
-----   Message from Fabiola Backer, RT sent at 11/13/2021  8:21 AM EST ----- Regarding: BMET NEEDED Can someone please add a BMET order for this patient? I will call him for scheduling... Thanks

## 2021-11-13 NOTE — Telephone Encounter (Signed)
Order for BMET placed as requested by CT Dept, and per CT protocol. Will make CT dept aware of BMET placed.

## 2021-11-13 NOTE — Patient Instructions (Addendum)
Medication Instructions:  Your physician recommends that you continue on your current medications as directed. Please refer to the Current Medication list given to you today.  *If you need a refill on your cardiac medications before your next appointment, please call your pharmacy*   Lab Work: None ordered   Testing/Procedures: None ordered   Follow-Up: At [facility], you and your health needs are our priority.  As part of our continuing mission to provide you with exceptional heart care, we have created designated Provider Care Teams.  These Care Teams include your primary Cardiologist (physician) and Advanced Practice Providers (APPs -  Physician Assistants and Nurse Practitioners) who all work together to provide you with the care you need, when you need it.  Remote monitoring is used to monitor your Pacemaker or ICD from home. This monitoring reduces the number of office visits required to check your device to one time per year. It allows [place] to keep an eye on the functioning of your device to ensure it is working properly. You are scheduled for a device check from home on 02/07/2022. You may send your transmission at any time that day. If you have a wireless device, the transmission will be sent automatically. After your physician reviews your transmission, you will receive a postcard with your next transmission date.  Your next appointment:   1 year(s)  The format for your next appointment:   In Person  Provider:   Dr. Ladona Ridgel   Thank you for choosing [facility]!!   [phone]    Other Instructions

## 2021-11-13 NOTE — Progress Notes (Signed)
HPI [name] returns today for followup. He is a pleasant 65 yo man with PVC's for which he underwent catheter ablation in 2019. 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 He had 10% PVC's mostly monomorphic on his cardiac monitor. Despite normal LV function he has class 2B dyspnea. He admits to dietary indiscretion with sodium. He denies edema. He has minimal palpitations. He was hospitalized several weeks ago with VT and worsening CHF. He was placed on sotalol.  Allergies  Allergen Reactions   Amlodipine Besylate Swelling and Other (See Comments)    Leg swelling with 10 mg daily am dosing; decreased with BID 5 mg dosing   Aspirin Itching     Current Outpatient Medications  Medication Sig Dispense Refill   Acetaminophen-Codeine 300-30 MG tablet Take 1-2 tablets by mouth every 12 (twelve) hours as needed for pain.     albuterol (VENTOLIN HFA) 108 (90 Base) MCG/ACT inhaler Inhale 2 puffs into the lungs every 4 (four) hours as needed for wheezing or shortness of breath. 8 g 2   apixaban (ELIQUIS) 5 MG TABS tablet Take 1 tablet (5 mg total) by mouth 2 (two) times daily. 60 tablet 5   atorvastatin (LIPITOR) 40 MG tablet Take 1 tablet (40 mg total) by mouth daily. (Patient taking differently: Take 40 mg by mouth at bedtime.) 90 tablet 3   colchicine 0.6 MG tablet Take 0.6-1.2 mg by mouth daily as needed (for gout flares).     fluticasone (FLONASE) 50 MCG/ACT nasal spray Place 1 spray into both nostrils daily. (Patient taking differently: Place 2 sprays into both nostrils at bedtime.)     fluticasone-salmeterol (ADVAIR) 100-50 MCG/ACT AEPB INHALE ONE PUFF BY MOUTH TWICE A DAY 60 each 0   folic acid (FOLVITE) 1 MG tablet Take 1 mg by mouth daily.     furosemide (LASIX) 40 MG tablet Take 1 tablet (40 mg total) by mouth daily  as needed (weight gain). Take 1 tablet by mouth daily for 3 days, then decrease to only as needed for increase weight gain 30 tablet 3   gabapentin (NEURONTIN) 600 MG tablet Take 600 mg by mouth with breakfast, with lunch, and with evening meal.     hydrALAZINE (APRESOLINE) 100 MG tablet Take 1 tablet (100 mg total) by mouth every 8 (eight) hours. 90 tablet 6   Magnesium Oxide 400 MG CAPS Take 1 capsule (400 mg total) by mouth daily. 90 capsule 3   metoprolol tartrate (LOPRESSOR) 50 MG tablet Take 1 tablet (50 mg total) by mouth 2 (two) times daily. 60 tablet 1   montelukast (SINGULAIR) 10 MG tablet Take 1 tablet (10 mg total) by mouth at bedtime. 90 tablet 3   pantoprazole (PROTONIX) 40 MG tablet Take 1 tablet (40 mg total) by mouth 2 (two) times daily. 60 tablet 1   potassium chloride SA (KLOR-CON) 20 MEQ tablet Take 2 tablets (40 mEq total) by mouth 2 (two) times daily. 120 tablet 6   predniSONE (DELTASONE) 5 MG tablet Take 5 mg by mouth daily with breakfast.     promethazine-dextromethorphan (PROMETHAZINE-DM) 6.25-15 MG/5ML syrup Take 5 mLs by mouth 4 (four) times daily as needed for cough. 118 mL 0   sildenafil (REVATIO) 20 MG tablet Take 20 mg by mouth daily as needed (erectile dysfunction).  sotalol (BETAPACE) 120 MG tablet Take 1 tablet (120 mg total) by mouth every 12 (twelve) hours. 60 tablet 6   spironolactone (ALDACTONE) 25 MG tablet Take 1 tablet (25 mg total) by mouth daily. 30 tablet 6   sucralfate (CARAFATE) 1 GM/10ML suspension Take 10 mLs (1 g total) by mouth 4 (four) times daily -  with meals and at bedtime. 420 mL 0   No current facility-administered medications for this visit.     Past Medical History:  Diagnosis Date   Ascending aortic aneurysm    a. CT 05/2021 showed 4.4x4.4cm TAA.   Bilateral lower extremity edema    Chronic gout without tophus    followed by dr Gerilyn Nestle    (06-08-2020  per pt last episode right knee 3 wks ago)   CKD (chronic kidney disease),  stage II    nephrology--- Jen Mow PA (10-29-2019 note in epic scanned in  media)   Heart failure with mid-range ejection fraction [facility]) 05/27/2021   05/26/21: Left ventricular ejection fraction, by estimation, is 45 to 50%. There is severe asymmetric left ventricular hypertrophy. G1DD present.    History of COVID-19 06/07/2021   HOCM (hypertrophic obstructive cardiomyopathy) ([place])    s/p ICD 07/2021   Hypertension    followed by cardiology, dr t. turner   (05-14-2018 nuclear study in epic , normal perfusion with nuclear ef 61%)   Left hydrocele    Low serum potassium level 04/27/2014   OSA on CPAP    per pt uses every night   Palpitations followed by dr t. [name]   06-08-2020  still feels palipations due to PVCs when exertion but not with chest pain/ discomfort   Pneumonia due to COVID-19 virus    PVC's (premature ventricular contractions) cardiologist--- dr t. turner   Status post PVC ablation by [name] 2019 with recurrence of frequent PVCs/bigeminy;  prior pseudobradycardia r/t pvcs   Rheumatoid arthritis involving multiple sites [facility])    rheumotology--- dr a. Gerilyn Nestle  ([facility] in [place])    ROS:   All systems reviewed and negative except as noted in the HPI.   Past Surgical History:  Procedure Laterality Date   BIOPSY  10/08/2021   Procedure: BIOPSY;  Surgeon: Daryel November, MD;  Location: [place];  Service: Gastroenterology;;   Kathleen Argue STUDY  08/18/2021   Procedure: BUBBLE STUDY;  Surgeon: Josue Hector, MD;  Location: [facility] ENDOSCOPY;  Service: Cardiovascular;;   ESOPHAGOGASTRODUODENOSCOPY N/A 10/08/2021   Procedure: ESOPHAGOGASTRODUODENOSCOPY (EGD);  Surgeon: Daryel November, MD;  Location: [place];  Service: Gastroenterology;  Laterality: N/A;   HYDROCELE EXCISION Left 06/14/2020   Procedure: LEFT  HYDROCELECTOMY ADULT;  Surgeon: Robley Fries, MD;  Location: [facility];  Service: Urology;  Laterality: Left;   ICD IMPLANT N/A  08/07/2021   Procedure: ICD IMPLANT;  Surgeon: Evans Lance, MD;  Location: [place] CV LAB;  Service: Cardiovascular;  Laterality: N/A;   INCISIONAL HERNIA REPAIR  02-23-2016   @[facility]    LAPAROSCOPIC   LAPAROSCOPIC INGUINAL HERNIA REPAIR Bilateral 08-22-2015  @[facility]    AND UMBILICAL HERNIA REPAIR   PVC ABLATION N/A 10/07/2018   Procedure: PVC ABLATION;  Surgeon: Evans Lance, MD;  Location: [place] CV LAB;  Service: Cardiovascular;  Laterality: N/A;   RIGHT/LEFT HEART CATH AND CORONARY ANGIOGRAPHY N/A 05/29/2021   Procedure: RIGHT/LEFT HEART CATH AND CORONARY ANGIOGRAPHY;  Surgeon: Burnell Blanks, MD;  Location: [place] CV LAB;  Service: Cardiovascular;  Laterality: N/A;  TEE WITHOUT CARDIOVERSION N/A 08/18/2021   Procedure: TRANSESOPHAGEAL ECHOCARDIOGRAM (TEE);  Surgeon: Wendall Stade, MD;  Location: [facility] ENDOSCOPY;  Service: Cardiovascular;  Laterality: N/A;   UMBILICAL HERNIA REPAIR  child     Family History  Problem Relation Age of Onset   Cardiomyopathy Mother    Heart attack Father      Social History   Socioeconomic History   Marital status: Married    Spouse name: Not on file   Number of children: Not on file   Years of education: Not on file   Highest education level: Not on file  Occupational History   Not on file  Tobacco Use   Smoking status: Never   Smokeless tobacco: Never  Vaping Use   Vaping Use: Never used  Substance and Sexual Activity   Alcohol use: Not Currently    Alcohol/week: 5.0 - 7.0 standard drinks    Types: 5 - 7 Cans of beer per week    Comment: Stop drinking alcohol 3 months ago   Drug use: Never   Sexual activity: Not on file  Other Topics Concern   Not on file  Social History Narrative   Not on file   Social Determinants of Health   Financial Resource Strain: Not on file  Food Insecurity: Not on file  Transportation Needs: Not on file  Physical Activity: Not on file  Stress: Not on file  Social Connections:  Not on file  Intimate Partner Violence: Not on file     BP 130/82   Pulse 80   Ht 5\' 10"  (1.778 m)   Wt 245 lb 3.2 oz (111.2 kg)   SpO2 94%   BMI 35.18 kg/m   Physical Exam:  Well appearing NAD HEENT: Unremarkable Neck:  No JVD, no thyromegally Lymphatics:  No adenopathy Back:  No CVA tenderness Lungs:  Clear with no wheezes HEART:  Regular rate rhythm, no murmurs, no rubs, no clicks Abd:  soft, positive bowel sounds, no organomegally, no rebound, no guarding Ext:  2 plus pulses, no edema, no cyanosis, no clubbing Skin:  No rashes no nodules Neuro:  CN II through XII intact, motor grossly intact  EKG - nsr with atrial pacing and RBBB  DEVICE  Normal device function.  See PaceArt for details.   Assess/Plan:  1. PVC's - he has developed recurrent PVCs and VT from a different location in the heart. He will continue sotalol. 2. Diastolic heart failure -he will increase the lasix to 40 bid and recheck labs. 3. AAA - follow 4. Stage 3 renal insufficiency - he will need this followed carefully with changes in his lasix dose.   Aydn Ferrara,MD

## 2021-11-15 ENCOUNTER — Ambulatory Visit (INDEPENDENT_AMBULATORY_CARE_PROVIDER_SITE_OTHER): Payer: No Typology Code available for payment source | Admitting: Cardiovascular Disease

## 2021-11-15 ENCOUNTER — Other Ambulatory Visit (INDEPENDENT_AMBULATORY_CARE_PROVIDER_SITE_OTHER): Payer: No Typology Code available for payment source | Admitting: *Deleted

## 2021-11-15 ENCOUNTER — Other Ambulatory Visit: Payer: Self-pay

## 2021-11-15 ENCOUNTER — Encounter: Payer: Self-pay | Admitting: Cardiovascular Disease

## 2021-11-15 VITALS — BP 114/80 | HR 80 | Ht 70.0 in | Wt 240.8 lb

## 2021-11-15 DIAGNOSIS — Q2112 Patent foramen ovale: Secondary | ICD-10-CM

## 2021-11-15 DIAGNOSIS — I10 Essential (primary) hypertension: Secondary | ICD-10-CM

## 2021-11-15 NOTE — Patient Instructions (Signed)
Medication Instructions:  Your physician recommends that you continue on your current medications as directed. Please refer to the Current Medication list given to you today.  *If you need a refill on your cardiac medications before your next appointment, please call your pharmacy  Follow-Up: At [facility], you and your health needs are our priority.  As part of our continuing mission to provide you with exceptional heart care, we have created designated Provider Care Teams.  These Care Teams include your primary Cardiologist (physician) and Advanced Practice Providers (APPs -  Physician Assistants and Nurse Practitioners) who all work together to provide you with the care you need, when you need it.  Follow up with Dr. Excell Seltzer as needed.

## 2021-11-15 NOTE — Telephone Encounter (Signed)
Spoke with patient in workcenter.  He started new rheumatology medication last Friday Orencia.  Patient asking if it can cause any side effects as has noticed flushing/red face intermittently sometimes at work/sometimes at home.  Similar to when he drinks wine.  He has stopped all wine and fish intake thinking they were related but still happening.  Taking multivitamin with breakfast along with tylenol for knee pain.  Discussed with patient per up to date orencia can cause hypermagnesemia will check magnesium level with BMET, Vitamins D and B12.  RN Rolly Salter notified.  Also noted can cause hypertension.  Discussed with patient high dose niacin can also cause flushing but typically within the first hour afterwards.  Since red face, feeling hot and lasts about 90 minutes to 2 hours occurring in afternoon and taking multivitamin at breakfast I do not think they are related.  Patient going to keep log of days/times/location when this symptom occurring.  Discussed with patient to notify his rheumatology and cardiology providers of this symptom also at next appt.  Discussed with patient I would review up to date for adverse effects of medicines on his active list also when I return to the office. Wearing knee sleeves and dosing tylenol on regular 6 hour schedule and they are helping.  Unsure if rheumatology injection has started working now too but knees are feeling better.  Not wearing knee sleeves today because washed them last night and still damp this morning letting them dry completely before wearing.  Knees still sore at end of day.  Patient stated weight has remained steady and leg swelling minimal.  Denied headache/dizziness/palpitations.  Patient A&Ox3 spoke full sentences without difficulty gait sure and steady.  No wheezing/shortness of breath/congestion/throat clearing or cough noted in workcenter/warehouse.  Patient verbalized understanding information/instructions, agreed with plan of care and had no further  questions at this time.

## 2021-11-15 NOTE — Progress Notes (Signed)
Cardiology Office Note:    Date:  11/15/2021   ID:  Ryan Glass, DOB 03-21-1956, MRN [id number]  PCP:  [name], MD   [facility] Providers Cardiologist:  [name], MD Electrophysiologist:  [name], MD     Referring MD: [name], MD   Chief Complaint  Patient presents with   Shortness of Breath   History of Present Illness:    Ryan Glass is a 65 y.o. male with a hx of chronic dyspnea with congestive heart failure, who was evaluated here August 24, 2021 for consideration of PFO closure.  There was a concern of orthodeoxia platypnea syndrome in the setting of his known PFO.  The patient has a complex medical history with rheumatoid arthritis on chronic prednisone, obstructive sleep apnea, PVCs status post PVC ablation and later treatment with sotalol, thoracic aortic aneurysm, and chronic respiratory failure.  Patient has a history of oxygen desaturation, but over recent months he has not been found to have hypoxemia with ambulation over multiple medical contacts.  The patient is here alone today.  He reports stable shortness of breath, no longer requiring oxygen.  He had no recent chest pain or pressure.  He has had mild leg swelling which is now improved on furosemide.  Past Medical History:  Diagnosis Date   Ascending aortic aneurysm    a. CT 05/2021 showed 4.4x4.4cm TAA.   Bilateral lower extremity edema    Chronic gout without tophus    followed by dr Sharmon Revere    (06-08-2020  per pt last episode right knee 3 wks ago)   CKD (chronic kidney disease), stage II    nephrology--- [facility] (10-29-2019 note in epic scanned in  media)   Heart failure with mid-range ejection fraction [facility]) 05/27/2021   05/26/21: Left ventricular ejection fraction, by estimation, is 45 to 50%. There is severe asymmetric left ventricular hypertrophy. G1DD present.    History of COVID-19 06/07/2021   HOCM (hypertrophic obstructive cardiomyopathy) (HCC)     s/p ICD 07/2021   Hypertension    followed by cardiology, dr t. turner   (05-14-2018 nuclear study in epic , normal perfusion with nuclear ef 61%)   Left hydrocele    Low serum potassium level 04/27/2014   OSA on CPAP    per pt uses every night   Palpitations followed by dr t. Mayford Knife   06-08-2020  still feels palipations due to PVCs when exertion but not with chest pain/ discomfort   Pneumonia due to COVID-19 virus    PVC's (premature ventricular contractions) cardiologist--- dr t. turner   Status post PVC ablation by Dr. Ladona Ridgel 2019 with recurrence of frequent PVCs/bigeminy;  prior pseudobradycardia r/t pvcs   Rheumatoid arthritis involving multiple sites [facility])    rheumotology--- dr a. Sharmon Revere  ([facility] in [place])    Past Surgical History:  Procedure Laterality Date   BIOPSY  10/08/2021   Procedure: BIOPSY;  Surgeon: Jenel Lucks, MD;  Location: [facility] ENDOSCOPY;  Service: Gastroenterology;;   Thressa Sheller STUDY  08/18/2021   Procedure: BUBBLE STUDY;  Surgeon: Wendall Stade, MD;  Location: [facility] ENDOSCOPY;  Service: Cardiovascular;;   ESOPHAGOGASTRODUODENOSCOPY N/A 10/08/2021   Procedure: ESOPHAGOGASTRODUODENOSCOPY (EGD);  Surgeon: Jenel Lucks, MD;  Location: [facility] ENDOSCOPY;  Service: Gastroenterology;  Laterality: N/A;   HYDROCELE EXCISION Left 06/14/2020   Procedure: LEFT  HYDROCELECTOMY ADULT;  Surgeon: Noel Christmas, MD;  [facility];  Service: Urology;  Laterality: Left;  ICD IMPLANT N/A 08/07/2021   Procedure: ICD IMPLANT;  Surgeon: Evans Lance, MD;  Location: [place] CV LAB;  Service: Cardiovascular;  Laterality: N/A;   INCISIONAL HERNIA REPAIR  02-23-2016   @[facility]    LAPAROSCOPIC   LAPAROSCOPIC INGUINAL HERNIA REPAIR Bilateral 08-22-2015  @[facility]    AND UMBILICAL HERNIA REPAIR   PVC ABLATION N/A 10/07/2018   Procedure: PVC ABLATION;  Surgeon: Evans Lance, MD;  Location: [place] [facility];  Service: Cardiovascular;  Laterality: N/A;   RIGHT/LEFT  HEART CATH AND CORONARY ANGIOGRAPHY N/A 05/29/2021   Procedure: RIGHT/LEFT HEART CATH AND CORONARY ANGIOGRAPHY;  Surgeon: Burnell Blanks, MD;  Location: [place] [facility];  Service: Cardiovascular;  Laterality: N/A;   TEE WITHOUT CARDIOVERSION N/A 08/18/2021   Procedure: TRANSESOPHAGEAL ECHOCARDIOGRAM (TEE);  Surgeon: Josue Hector, MD;  Location: [place];  Service: Cardiovascular;  Laterality: N/A;   UMBILICAL HERNIA REPAIR  child    Current Medications: Current Meds  Medication Sig   Abatacept (ORENCIA CLICKJECT) 0000000 MG/ML SOAJ Inject into the skin once a week.   Acetaminophen-Codeine 300-30 MG tablet Take 1-2 tablets by mouth every 12 (twelve) hours as needed for pain.   albuterol (VENTOLIN HFA) 108 (90 Base) MCG/ACT inhaler Inhale 2 puffs into the lungs every 4 (four) hours as needed for wheezing or shortness of breath.   apixaban (ELIQUIS) 5 MG TABS tablet Take 1 tablet (5 mg total) by mouth 2 (two) times daily.   atorvastatin (LIPITOR) 40 MG tablet Take 1 tablet (40 mg total) by mouth daily. (Patient taking differently: Take 40 mg by mouth at bedtime.)   colchicine 0.6 MG tablet Take 0.6-1.2 mg by mouth daily as needed (for gout flares).   fluticasone (FLONASE) 50 MCG/ACT nasal spray Place 1 spray into both nostrils daily. (Patient taking differently: Place 2 sprays into both nostrils at bedtime.)   fluticasone-salmeterol (ADVAIR) 100-50 MCG/ACT AEPB INHALE ONE PUFF BY MOUTH TWICE A DAY   folic acid (FOLVITE) 1 MG tablet Take 1 mg by mouth daily.   furosemide (LASIX) 40 MG tablet Take 1 tablet (40 mg total) by mouth daily as needed (weight gain). Take 1 tablet by mouth daily for 3 days, then decrease to only as needed for increase weight gain   gabapentin (NEURONTIN) 600 MG tablet Take 600 mg by mouth with breakfast, with lunch, and with evening meal.   hydrALAZINE (APRESOLINE) 100 MG tablet Take 1 tablet (100 mg total) by mouth every 8 (eight) hours.   Magnesium Oxide 400  MG CAPS Take 1 capsule (400 mg total) by mouth daily.   metoprolol tartrate (LOPRESSOR) 50 MG tablet Take 1 tablet (50 mg total) by mouth 2 (two) times daily.   montelukast (SINGULAIR) 10 MG tablet Take 1 tablet (10 mg total) by mouth at bedtime.   pantoprazole (PROTONIX) 40 MG tablet Take 1 tablet (40 mg total) by mouth 2 (two) times daily.   potassium chloride SA (KLOR-CON) 20 MEQ tablet Take 2 tablets (40 mEq total) by mouth 2 (two) times daily.   predniSONE (DELTASONE) 5 MG tablet Take 5 mg by mouth daily with breakfast.   promethazine-dextromethorphan (PROMETHAZINE-DM) 6.25-15 MG/5ML syrup Take 5 mLs by mouth 4 (four) times daily as needed for cough.   sildenafil (REVATIO) 20 MG tablet Take 20 mg by mouth daily as needed (erectile dysfunction).   sotalol (BETAPACE) 120 MG tablet Take 1 tablet (120 mg total) by mouth every 12 (twelve) hours.   spironolactone (ALDACTONE) 25 MG tablet Take  1 tablet (25 mg total) by mouth daily.   sucralfate (CARAFATE) 1 GM/10ML suspension Take 10 mLs (1 g total) by mouth 4 (four) times daily -  with meals and at bedtime.     Allergies:   Amlodipine besylate and Aspirin   Social History   Socioeconomic History   Marital status: Married    Spouse name: Not on file   Number of children: Not on file   Years of education: Not on file   Highest education level: Not on file  Occupational History   Not on file  Tobacco Use   Smoking status: Never   Smokeless tobacco: Never  Vaping Use   Vaping Use: Never used  Substance and Sexual Activity   Alcohol use: Not Currently    Alcohol/week: 5.0 - 7.0 standard drinks    Types: 5 - 7 Cans of beer per week    Comment: Stop drinking alcohol 3 months ago   Drug use: Never   Sexual activity: Not on file  Other Topics Concern   Not on file  Social History Narrative   Not on file   Social Determinants of Health   Financial Resource Strain: Not on file  Food Insecurity: Not on file  Transportation Needs: Not  on file  Physical Activity: Not on file  Stress: Not on file  Social Connections: Not on file     Family History: The patient's family history includes Cardiomyopathy in his mother; Heart attack in his father.  ROS:   Please see the history of present illness.    All other systems reviewed and are negative.  EKGs/Labs/Other Studies Reviewed:    The following studies were reviewed today: TEE 08/22/2021: FINDINGS   Left Ventricle: Left ventricular ejection fraction, by estimation, is 55  to 60%. The left ventricle has normal function. The left ventricular  internal cavity size was normal in size. There is mild left ventricular  hypertrophy. Left ventricular diastolic   function could not be evaluated.   Right Ventricle: AICD wires in RV/RA. The right ventricular size is  moderately enlarged. Right vetricular wall thickness was not assessed.  Right ventricular systolic function is moderately reduced.   Left Atrium: R/L upper pulmonary veins and LLPV normal Unable to visualize  RLPV. Left atrial size was mildly dilated. No left atrial/left atrial  appendage thrombus was detected.   Right Atrium: Right atrial size was mildly dilated.   Pericardium: A small pericardial effusion is present. The pericardial  effusion is anterior to the right ventricle.   Mitral Valve: The mitral valve is grossly normal. Mild mitral valve  regurgitation.   Tricuspid Valve: The tricuspid valve is normal in structure. Tricuspid  valve regurgitation is mild.   Aortic Valve: The aortic valve is tricuspid. Aortic valve regurgitation is  mild.   Pulmonic Valve: The pulmonic valve was normal in structure. Pulmonic valve  regurgitation is mild.   Aorta: Aortic dilatation noted. 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 There is mild dilatation of the ascending aorta,  measuring 41 mm.   IAS/Shunts: Evidence of atrial level shunting detected by color flow  Doppler. Agitated  saline contrast was given intravenously to evaluate for  intracardiac shunting. Agitated saline contrast bubble study was positive  with shunting observed within 3-6  cardiac cycles suggestive of interatrial shunt. Atrial septal aneurysm  with moderate sized PFO Right to left shunting particularly with valsalva  and abdominal pressure.   EKG:  EKG is not  ordered today.    Recent Labs: 06/06/2021: TSH 0.975 09/19/2021: NT-Pro BNP 355 10/07/2021: ALT 26; B Natriuretic Peptide 33.9 10/12/2021: Hemoglobin 9.2; Magnesium 2.0; Platelets 146 10/31/2021: BUN 16; Creatinine, Ser 1.37; Potassium 4.5; Sodium 139  Recent Lipid Panel    Component Value Date/Time   CHOL 123 05/27/2021 0138   CHOL 136 02/20/2021 1215   TRIG 67 05/27/2021 0138   HDL 49 05/27/2021 0138   HDL 64 02/20/2021 1215   CHOLHDL 2.5 05/27/2021 0138   VLDL 13 05/27/2021 0138   LDLCALC 61 05/27/2021 0138   LDLCALC 60 02/20/2021 1215     Risk Assessment/Calculations:           Physical Exam:    VS:  BP 114/80   Pulse 80   Ht 5\' 10"  (1.778 m)   Wt 240 lb 12.8 oz (109.2 kg)   SpO2 96%   BMI 34.55 kg/m     Wt Readings from Last 3 Encounters:  11/15/21 240 lb 12.8 oz (109.2 kg)  11/13/21 245 lb 3.2 oz (111.2 kg)  10/31/21 241 lb (109.3 kg)     GEN:  Well nourished, well developed in no acute distress HEENT: Normal NECK: No JVD; No carotid bruits LYMPHATICS: No lymphadenopathy CARDIAC: RRR, no murmurs, rubs, gallops RESPIRATORY:  Clear to auscultation without rales, wheezing or rhonchi  ABDOMEN: Soft, non-tender, non-distended MUSCULOSKELETAL:  No edema; No deformity  SKIN: Warm and dry NEUROLOGIC:  Alert and oriented x 3 PSYCHIATRIC:  Normal affect   ASSESSMENT:    1. PFO (patent foramen ovale)    PLAN:    In order of problems listed above:  I reviewed the patient's transesophageal echo again.  He does have a PFO but it is not a large PFO with traumatic right to left shunting.  The patient is no  longer hypoxemic.  I think his chronic dyspnea is multifactorial as previously outlined.  He has chronic congestive heart failure, obesity, and deconditioning.  I do not think that transcatheter PFO closure would benefit him.  I would be happy to see him back in the future if problems arise.  The role of PFO and orthodeoxia/platypnea is reviewed with the patient and he understands that he does not exhibit typical symptoms with lack of hypoxemia at this point.  He will continue to work on weight loss, diet, and a graded exercise program.  He will continue on his current medical program for chronic combined systolic and diastolic heart failure.    Cardiac Rehabilitation Eligibility Assessment       Medication Adjustments/Labs and Tests Ordered: Current medicines are reviewed at length with the patient today.  Concerns regarding medicines are outlined above.  No orders of the defined types were placed in this encounter.  No orders of the defined types were placed in this encounter.   Patient Instructions  Medication Instructions:  Your physician recommends that you continue on your current medications as directed. Please refer to the Current Medication list given to you today.  *If you need a refill on your cardiac medications before your next appointment, please call your pharmacy  Follow-Up: At [facility], you and your health needs are our priority.  As part of our continuing mission to provide you with exceptional heart care, we have created designated Provider Care Teams.  These Care Teams include your primary Cardiologist (physician) and Advanced Practice Providers (APPs -  Physician Assistants and Nurse Practitioners) who all work together to provide you with the care you need, when  you need it.  Follow up with Dr. Burt Knack as needed.    Signed, Sherren Mocha, MD  11/15/2021 4:35 PM    [place]

## 2021-11-16 ENCOUNTER — Encounter: Payer: Self-pay | Admitting: Registered Nurse

## 2021-11-16 ENCOUNTER — Telehealth: Payer: Self-pay | Admitting: Registered Nurse

## 2021-11-16 DIAGNOSIS — I25118 Atherosclerotic heart disease of native coronary artery with other forms of angina pectoris: Secondary | ICD-10-CM

## 2021-11-16 NOTE — Progress Notes (Signed)
Remote ICD transmission.   

## 2021-11-16 NOTE — Telephone Encounter (Signed)
Patient requested refill atorvastatin 40mg  po daily #90 RF0.   Dispensed 90 tabs to patient today from [facility].   Discussed with patient will need new Rx for next fill.  Has appt scheduled with Dr cardiology in February 2023.  Saw Dr March 2023 yesterday and was counseled did not feel PFO closure would benefit him at this time.  Patient to start cardiac rehab and follow diet/liquid intake guidelines for heart failure and follow up with pulmonology.   Patient stated missed last cardiac rehab appt and needs to reschedule and he stated he will contact that office this week to do so.  Patient reported he has bike at home and weights/rubber bands resistance and using them weekly.  Patient stated rheumatology also told him to decrease knee exercises and increase prednisone to 10mg  from 5mg  daily and continue taking tylenol with codeine to help with pain.  Patient asking which medications he takes are formulary at [facility] also.  Discussed metoprolol tartrate 50mg , atorvastatin 40mg , potassium chloride , furosemide 40mg  and prednisone 10mg  available from onsite [facility] dispensary.  Patient stated weight has been between 238-240lbs daily.  He took lasix this am.  Sp02 96-97% RA in clinic today after walking across warehouse to clinic today.  HR 80s.  Patient asking when he should notice a difference with starting orencia--per up to date and orencia drug information page weeks to months before full effect noted.  Discussed with patient it prevents new inflammation but doesn't flare down existing inflammation.  His prednisone is used to flare down existing inflammation.  Per orencia website 3 to 6 months before full effect after starting medication.  Patient second dose to be administered tomorrow.  Discussed with patient hydralazine and orencia can cause flushing and to monitoring symptoms to see if starting after his hydralazine taken 3 times per day or after his orencia administration or magnesium.   Patient stated he will keep log of symptoms with time.  Patient stated 1 bottle of potassium remaining then will come to clinic for fill.  Patient A&Ox3 spoke full sentences without difficulty. Bilateral allergic shiners and lower eyelid nonpitting edema today 1-2+/4; no cough/throat clearing noted.   Some nasal congestion.  Spoke full sentences without difficulty.  BBS CTA.  Patient verbalized understanding information/instructions, agreed with plan of care and had no further questions at this time.

## 2021-11-17 ENCOUNTER — Encounter: Payer: Self-pay | Admitting: Pulmonary Disease

## 2021-11-20 NOTE — Progress Notes (Signed)
Cardiology Office Note:    Date:  11/21/2021   ID:  CLEARNCE LEJA, DOB 1956-05-10, MRN [id number]  PCP:  [name], MD   [facility] Providers Cardiologist:  [name], MD Electrophysiologist:  [name], MD     Referring MD: Quintella Reichert, MD   Consult for HCM  History of Present Illness:    Ryan Glass is a 65 y.o. 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  Had O2 desats and has seen pulmonary and Dr. Excell Seltzer for PFO closure.  He has started diuretics and cardiopulmonary rehab. Had had VT, but this in RVIT PVCs and has started sotalol.  CMR showed hypertrophy that can be consistent with HCM.  Seen 11/21/21.  Patient notes that he is doing better.   He notes that he a history of high blood pressure, he has had HTN issues for years (has had BP as high as 200 for the past 20 years). O2 has improved with BP control and cardiopulmonary rehab.  Still having borderline low O2.  Last lasix was once this week (takes for weight above 240 lbs (went from 244 to 240 on his scale).   Discussed with his daughter, Dia Sitter, virtually. They note that he has no chest pain. His breathing has worsened dramatically for the last 6 months, since PE diagnosis (normal RHC at that time with minimal non obstructive CAD) He notes tight fitting pants.  He denies LE swelling. She notes that he has has changes in his BB and that prior hospitalitis were hoping to get him to just APVP. She notes that it was unclear if there was to be PFO closure or not.  She notes that while the HCM information seems reasonable, that his QOL has diminished with the breathing and would prefer deferring testing that does not focus on this until it is resolved.  No history of HCM. Dad died of MI at age of 55. No history of SCD in family- mother has cardiomegaly NOS.  Ambulatory Blood pressure unclear: he has  forgetten his results but thinks he is around 135-140 SBP.   Past Medical History:  Diagnosis Date   Ascending aortic aneurysm    a. CT 05/2021 showed 4.4x4.4cm TAA.   Bilateral lower extremity edema    Chronic gout without tophus    followed by dr Sharmon Revere    (06-08-2020  per pt last episode right knee 3 wks ago)   CKD (chronic kidney disease), stage II    nephrology--- Virgina Norfolk PA (10-29-2019 note in epic scanned in  media)   Heart failure with mid-range ejection fraction [facility]) 05/27/2021   05/26/21: Left ventricular ejection fraction, by estimation, is 45 to 50%. There is severe asymmetric left ventricular hypertrophy. G1DD present.    History of COVID-19 06/07/2021   HOCM (hypertrophic obstructive cardiomyopathy) (HCC)    s/p ICD 07/2021   Hypertension    followed by cardiology, dr t. turner   (05-14-2018 nuclear study in epic , normal perfusion with nuclear ef 61%)   Left hydrocele    Low serum potassium level 04/27/2014   OSA on CPAP    per pt uses every night   Palpitations followed by dr t. Mayford Knife   06-08-2020  still feels palipations due to PVCs when exertion but not with chest pain/ discomfort   Pneumonia due to COVID-19 virus    PVC's (premature ventricular contractions) cardiologist--- dr t.  turner   Status post PVC ablation by [name] 2019 with recurrence of frequent PVCs/bigeminy;  prior pseudobradycardia r/t pvcs   Rheumatoid arthritis involving multiple sites [facility])    rheumotology--- dr a. Gerilyn Nestle  ([facility] in [place])    Past Surgical History:  Procedure Laterality Date   BIOPSY  10/08/2021   Procedure: BIOPSY;  Surgeon: Daryel November, MD;  Location: [place];  Service: Gastroenterology;;   Kathleen Argue STUDY  08/18/2021   Procedure: BUBBLE STUDY;  Surgeon: Josue Hector, MD;  Location: [facility] ENDOSCOPY;  Service: Cardiovascular;;   ESOPHAGOGASTRODUODENOSCOPY N/A 10/08/2021   Procedure: ESOPHAGOGASTRODUODENOSCOPY (EGD);  Surgeon: Daryel November, MD;   Location: [place];  Service: Gastroenterology;  Laterality: N/A;   HYDROCELE EXCISION Left 06/14/2020   Procedure: LEFT  HYDROCELECTOMY ADULT;  Surgeon: Robley Fries, MD;  Location: [facility];  Service: Urology;  Laterality: Left;   ICD IMPLANT N/A 08/07/2021   Procedure: ICD IMPLANT;  Surgeon: Evans Lance, MD;  Location: [place] CV LAB;  Service: Cardiovascular;  Laterality: N/A;   INCISIONAL HERNIA REPAIR  02-23-2016   @[facility]    LAPAROSCOPIC   LAPAROSCOPIC INGUINAL HERNIA REPAIR Bilateral 08-22-2015  @[facility]    AND UMBILICAL HERNIA REPAIR   PVC ABLATION N/A 10/07/2018   Procedure: PVC ABLATION;  Surgeon: Evans Lance, MD;  Location: [place] CV LAB;  Service: Cardiovascular;  Laterality: N/A;   RIGHT/LEFT HEART CATH AND CORONARY ANGIOGRAPHY N/A 05/29/2021   Procedure: RIGHT/LEFT HEART CATH AND CORONARY ANGIOGRAPHY;  Surgeon: Burnell Blanks, MD;  Location: [place] CV LAB;  Service: Cardiovascular;  Laterality: N/A;   TEE WITHOUT CARDIOVERSION N/A 08/18/2021   Procedure: TRANSESOPHAGEAL ECHOCARDIOGRAM (TEE);  Surgeon: Josue Hector, MD;  Location: [place];  Service: Cardiovascular;  Laterality: N/A;   UMBILICAL HERNIA REPAIR  child    Current Medications: Current Meds  Medication Sig   Abatacept (ORENCIA CLICKJECT) 0000000 MG/ML SOAJ Inject into the skin once a week.   Acetaminophen-Codeine 300-30 MG tablet Take 1-2 tablets by mouth every 12 (twelve) hours as needed for pain.   albuterol (VENTOLIN HFA) 108 (90 Base) MCG/ACT inhaler Inhale 2 puffs into the lungs every 4 (four) hours as needed for wheezing or shortness of breath.   apixaban (ELIQUIS) 5 MG TABS tablet Take 1 tablet (5 mg total) by mouth 2 (two) times daily.   atorvastatin (LIPITOR) 40 MG tablet Take 1 tablet (40 mg total) by mouth daily.   colchicine 0.6 MG tablet Take 0.6-1.2 mg by mouth daily as needed (for gout flares).   diltiazem (CARDIZEM CD) 180 MG 24 hr capsule Take 180 mg  by mouth daily.   fluticasone (FLONASE) 50 MCG/ACT nasal spray Place 1 spray into both nostrils daily.   fluticasone-salmeterol (ADVAIR) 100-50 MCG/ACT AEPB INHALE ONE PUFF BY MOUTH TWICE A DAY   folic acid (FOLVITE) 1 MG tablet Take 1 mg by mouth daily.   furosemide (LASIX) 40 MG tablet Take 1 tablet (40 mg total) by mouth daily as needed (weight gain). Take 1 tablet by mouth daily for 3 days, then decrease to only as needed for increase weight gain   gabapentin (NEURONTIN) 600 MG tablet Take 600 mg by mouth with breakfast, with lunch, and with evening meal.   hydrALAZINE (APRESOLINE) 100 MG tablet Take 1 tablet (100 mg total) by mouth every 8 (eight) hours.   Magnesium Oxide 400 MG CAPS Take 1 capsule (400 mg total) by mouth daily.   metoprolol tartrate (LOPRESSOR) 50  MG tablet Take 1 tablet (50 mg total) by mouth 2 (two) times daily.   montelukast (SINGULAIR) 10 MG tablet Take 1 tablet (10 mg total) by mouth at bedtime.   pantoprazole (PROTONIX) 40 MG tablet Take 1 tablet (40 mg total) by mouth 2 (two) times daily.   potassium chloride SA (KLOR-CON) 20 MEQ tablet Take 2 tablets (40 mEq total) by mouth 2 (two) times daily.   predniSONE (DELTASONE) 5 MG tablet Take 5 mg by mouth daily with breakfast.   promethazine-dextromethorphan (PROMETHAZINE-DM) 6.25-15 MG/5ML syrup Take 5 mLs by mouth 4 (four) times daily as needed for cough.   sildenafil (REVATIO) 20 MG tablet Take 20 mg by mouth daily as needed (erectile dysfunction).   sotalol (BETAPACE) 120 MG tablet Take 1 tablet (120 mg total) by mouth every 12 (twelve) hours.   spironolactone (ALDACTONE) 25 MG tablet Take 1 tablet (25 mg total) by mouth daily.   sucralfate (CARAFATE) 1 GM/10ML suspension Take 10 mLs (1 g total) by mouth 4 (four) times daily -  with meals and at bedtime.     Allergies:   Amlodipine besylate and Aspirin   Social History   Socioeconomic History   Marital status: Married    Spouse name: Not on file   Number of  children: Not on file   Years of education: Not on file   Highest education level: Not on file  Occupational History   Not on file  Tobacco Use   Smoking status: Never   Smokeless tobacco: Never  Vaping Use   Vaping Use: Never used  Substance and Sexual Activity   Alcohol use: Not Currently    Alcohol/week: 5.0 - 7.0 standard drinks    Types: 5 - 7 Cans of beer per week    Comment: Stop drinking alcohol 3 months ago   Drug use: Never   Sexual activity: Not on file  Other Topics Concern   Not on file  Social History Narrative   Not on file   Social Determinants of Health   Financial Resource Strain: Not on file  Food Insecurity: Not on file  Transportation Needs: Not on file  Physical Activity: Not on file  Stress: Not on file  Social Connections: Not on file    Social: [place], I spoke with his daugther  Family History: The patient's family history includes Cardiomyopathy in his mother; Heart attack in his father.  ROS:   Please see the history of present illness.     All other systems reviewed and are negative.  EKGs/Labs/Other Studies Reviewed:    The following studies were reviewed today:  Cardiac Event Monitoring: Date: 08/02/21 Results: Predominant rhythm was normal sinus rhythm with average heart rate 65bpm and ranged from 42 to 119bpm. Frequent PVCs, ventricular couplets, bigminal PVCs - PVC load 12.6%. Occasional PACs and atrial couplets and triplets Ventricular tachycardia lasting 5 minutes and 11 seconds at 184bpm. Possible atrial fibrillation for 9 minutes and 42 seconds Patient was symptomatic with afib and VT.    Transthoracic Echocardiogram: Date: 05/26/21 Results:  1. Left ventricular ejection fraction, by estimation, is 45 to 50%. The  left ventricle has mildly decreased function. The left ventricle has no  regional wall motion abnormalities. There is severe asymmetric left  ventricular hypertrophy. Left ventricular   diastolic parameters  are consistent with Grade I diastolic dysfunction  (impaired relaxation).   2. Right ventricular systolic function is moderately reduced. The right  ventricular size is moderately enlarged. There is moderately elevated  pulmonary artery systolic pressure.   3. Left atrial size was moderately dilated.   4. Right atrial size was mildly dilated.   5. The mitral valve is normal in structure. Mild mitral valve  regurgitation.   6. The aortic valve is normal in structure. Aortic valve regurgitation is  not visualized. No aortic stenosis is present.   7. Aortic dilatation noted. There is mild dilatation of the ascending  aorta, measuring 41 mm.   Transesophageal Echocardiogram: Date: 08/18/21 Results: 1. Will have Dr Burt Knack review regarding closure of PFO in regard to  patients hypoxemai.   2. Left ventricular ejection fraction, by estimation, is 55 to 60%. The  left ventricle has normal function. There is mild left ventricular  hypertrophy. Left ventricular diastolic function could not be evaluated.   3. AICD wires in RV/RA. Right ventricular systolic function is moderately  reduced. The right ventricular size is moderately enlarged.   4. R/L upper pulmonary veins and LLPV normal Unable to visualize RLPV.  Left atrial size was mildly dilated. No left atrial/left atrial appendage  thrombus was detected.   5. Evidence of atrial level shunting detected by color flow Doppler.  Agitated saline contrast bubble study was positive with shunting observed  within 3-6 cardiac cycles suggestive of interatrial shunt.   6. Right atrial size was mildly dilated.   7. A small pericardial effusion is present. The pericardial effusion is  anterior to the right ventricle.   8. The mitral valve is grossly normal. Mild mitral valve regurgitation.   9. The aortic valve is tricuspid. Aortic valve regurgitation is mild.  10. Aortic dilatation noted. There is moderate dilatation of the aortic  root, measuring 45  mm. There is mild dilatation of the ascending aorta,  measuring 41 mm.    NM Stress Testing : Date: 02/07/21 Results: The left ventricular ejection fraction is normal (55-65%). Nuclear stress EF: 56%. There was no ST segment deviation noted during stress. No T wave inversion was noted during stress. This is a low risk study.   No reversible ischemia. LVEF 56% with normal wall motion. This is a low risk study. No prior for comparison.  CMR: Date: 2022 Results: There is no decrease in T1 signal which would be expected in Fabry's Disease IMPRESSION: 1. Asymmetric LV hypertrophy measuring 1mm in basal septum (52mm in basal lateral wall). This meets criteria for hypertrophic cardiomyopathy   2. Patchy late gadolinium enhancement at RV insertion site and basal lateral wall. LGE accounts for 1% of total myocardial mass. While this LGE pattern is consistent with HCM, would also consider Fabry disease as it can present with basal lateral LGE and LVH. Would recommend checking alpha-galactosidase A level   3. Normal LV size and systolic function (EF 123456), though was difficult to quantify volumes/EF due to significant motion artifact during acquisition   4.  Normal RV size with mild systolic dysfunction (EF [id number])   5.  Small pericardial effusion   6.  Dilated ascending aorta measuring 22mm   Recent Labs: 06/06/2021: TSH 0.975 09/19/2021: NT-Pro BNP 355 10/07/2021: ALT 26; B Natriuretic Peptide 33.9 10/12/2021: Hemoglobin 9.2; Magnesium 2.0; Platelets 146 10/31/2021: BUN 16; Creatinine, Ser 1.37; Potassium 4.5; Sodium 139  Recent Lipid Panel    Component Value Date/Time   CHOL 123 05/27/2021 0138   CHOL 136 02/20/2021 1215   TRIG 67 05/27/2021 0138   HDL 49 05/27/2021 0138   HDL 64 02/20/2021 1215   CHOLHDL 2.5 05/27/2021 0138   VLDL 13  05/27/2021 0138   LDLCALC 61 05/27/2021 0138   LDLCALC 60 02/20/2021 1215    Physical Exam:    VS:  BP (!) 145/93   Pulse 81   Ht 5\' 10"   (1.778 m)   Wt 245 lb (111.1 kg)   SpO2 95%   BMI 35.15 kg/m     Wt Readings from Last 3 Encounters:  11/21/21 245 lb (111.1 kg)  11/15/21 240 lb 12.8 oz (109.2 kg)  11/13/21 245 lb 3.2 oz (111.2 kg)    Gen: No distress, morbid obesity Neck: No JVD Cardiac: No Rubs or Gallops, no Murmur, regular rhythm with no irregular heart beats radial pulses Respiratory: Clear to auscultation bilaterally, normal effort, normal  respiratory rate GI: Soft, nontenderly distended with positive fluid wave MS: +1 bilateral pitting edema;  moves all extremities Integument: Skin feels warm Neuro:  At time of evaluation, alert and oriented to person/place/time/situation  Psych: Normal affect  ASSESSMENT:    1. Hypertrophic cardiomyopathy ([place])   2. VT (ventricular tachycardia)   3. AF (paroxysmal atrial fibrillation) (HCC)    PLAN:    Concern for Hypertrophic cardiomyopathy vs hypertensive phenocopy in the setting of prolonged  HTN S/p SJ ICD (RVIT PVCs, sees [name], etiology unrelated to above) With Diastolic HF component Complicated by CKD Stage IIIa PFO who sub acute hypoxemia Mild thoracic aortic aneurysm 44 mm  last eval 07/2021 History of possible PE;  - we will continue metoprolol and aldactone 25 mg, and hydralazine - patient to check amb BM at home, if elevated mildly will increase metoprolol, if significantly start IMDUR (there is no evidence of LVOT obstruction) - in one week we recommended an alpha galactosidase Fabry's eval (though CMR parametric mapping not consistent), a BMP, BMP and Mg.  Patient asked [place] to write these down as he prefers to get his labs drawn by the PA at work. - if the DDX for HCM is limited to de novo vs hypertensive phenocopy, we will discuss genetic testing and family screening at next visit - had planned 3 month f/u, but patient's daughter has asked if he defer HCM eval until improvement in his SOB, which is reasonable - if not improvement of SOB, and with  ? PE history (sub-optimal imaging on multiple scans) V/Q scan and RHC could be considered.   Time Spent Directly with Patient:   I have spent a total of 60 minutes with the patient reviewing notes, imaging, EKGs, labs and examining the patient as well as establishing an assessment and plan that was discussed personally with the patient.  > 50% of time was spent in direct patient care and daughter in regard to each of the above medical problems and the role of his medications, in addition to what HCM is.      Cardiac Rehabilitation Eligibility Assessment            Medication Adjustments/Labs and Tests Ordered: Current medicines are reviewed at length with the patient today.  Concerns regarding medicines are outlined above.  No orders of the defined types were placed in this encounter.  No orders of the defined types were placed in this encounter.   Patient Instructions  Medication Instructions:  Your physician recommends that you continue on your current medications as directed. Please refer to the Current Medication list given to you today.  *If you need a refill on your cardiac medications before your next appointment, please call your pharmacy*   Lab Work: IN 1 WEEK: Basic Metabolic  Panel   BNP   Alpha galactosidase   If you have labs (blood work) drawn today and your tests are completely normal, you will receive your results only by: MyChart Message (if you have MyChart) OR A paper copy in the mail If you have any lab test that is abnormal or we need to change your treatment, we will call you to review the results.   Testing/Procedures: NONE   Follow-Up: At [facility], you and your health needs are our priority.  As part of our continuing mission to provide you with exceptional heart care, we have created designated Provider Care Teams.  These Care Teams include your primary Cardiologist (physician) and Advanced Practice Providers (APPs -  Physician Assistants  and Nurse Practitioners) who all work together to provide you with the care you need, when you need it.    Your next appointment:   4 month(s)  The format for your next appointment:   In Person  Provider:   Rudean Haskell, MD     :1}      Signed, Werner Lean, MD  11/21/2021 5:56 PM    [place]

## 2021-11-21 ENCOUNTER — Encounter: Payer: Self-pay | Admitting: Internal Medicine

## 2021-11-21 ENCOUNTER — Other Ambulatory Visit: Payer: Self-pay

## 2021-11-21 ENCOUNTER — Ambulatory Visit (INDEPENDENT_AMBULATORY_CARE_PROVIDER_SITE_OTHER): Payer: No Typology Code available for payment source | Admitting: Internal Medicine

## 2021-11-21 VITALS — BP 145/93 | HR 81 | Ht 70.0 in | Wt 245.0 lb

## 2021-11-21 DIAGNOSIS — I472 Ventricular tachycardia, unspecified: Secondary | ICD-10-CM

## 2021-11-21 DIAGNOSIS — I422 Other hypertrophic cardiomyopathy: Secondary | ICD-10-CM | POA: Diagnosis not present

## 2021-11-21 DIAGNOSIS — I480 Paroxysmal atrial fibrillation: Secondary | ICD-10-CM | POA: Diagnosis not present

## 2021-11-21 NOTE — Patient Instructions (Signed)
Medication Instructions:  Your physician recommends that you continue on your current medications as directed. Please refer to the Current Medication list given to you today.  *If you need a refill on your cardiac medications before your next appointment, please call your pharmacy*   Lab Work: IN 1 WEEK: Basic Metabolic Panel   BNP   Alpha galactosidase   If you have labs (blood work) drawn today and your tests are completely normal, you will receive your results only by: MyChart Message (if you have MyChart) OR A paper copy in the mail If you have any lab test that is abnormal or we need to change your treatment, we will call you to review the results.   Testing/Procedures: NONE   Follow-Up: At [facility], you and your health needs are our priority.  As part of our continuing mission to provide you with exceptional heart care, we have created designated Provider Care Teams.  These Care Teams include your primary Cardiologist (physician) and Advanced Practice Providers (APPs -  Physician Assistants and Nurse Practitioners) who all work together to provide you with the care you need, when you need it.    Your next appointment:   4 month(s)  The format for your next appointment:   In Person  Provider:   Riley Lam, MD     :1}

## 2021-11-25 ENCOUNTER — Telehealth: Payer: Self-pay | Admitting: Registered Nurse

## 2021-11-25 ENCOUNTER — Encounter: Payer: Self-pay | Admitting: Registered Nurse

## 2021-11-25 NOTE — Telephone Encounter (Signed)
Patient seen in clinic 11/23/21 denied any episodes of flushing in the past 8 days.  Had appt with cardiology 11/21/21 heart failure specialist.  Going to restart PT.  Has labs pending next week BMET/BNP/alpha galactosidase will need drawn at cardiology.  Next follow up cardiology heart failure 15 May at 1620.

## 2021-11-25 NOTE — Telephone Encounter (Signed)
Late entry patient asking if labs ordered by cardiology can be drawn at [facility] due next week BMP, BNP and alpha galactosidase.  Discussed with patient BMP and BNP can be drawn onsite but unsure if alpha galactosidase can be done.  RN Rolly Salter will need to speak with [facility].  RN Rolly Salter notified and stated it cannot be performed at this clinic and patient will need to schedule at cardiology office.  She will notify patient.  Patient stated He had a good visit with Dr Izora Ribas yesterday.  Feeling well no new or worsening symptoms.  A&Ox3 spoke full sentences without difficulty skin warm dry and pink gait sure and steady in clinic.  Respirations even and unlabored after walking across warehouse to clinic.  Patient verbalized understanding information/instructions and had no further questions at this time.

## 2021-11-27 ENCOUNTER — Ambulatory Visit: Payer: No Typology Code available for payment source | Admitting: Gastroenterology

## 2021-11-27 NOTE — Telephone Encounter (Signed)
Pt informed Fri 12/16 by phone that he will need to schedule labs at cardiology. He verbalizes understanding and will call for appt.

## 2021-11-28 NOTE — Telephone Encounter (Signed)
Patient contacted NP stated had flushing later in the day after orencia injection that morning.  Discussed with patient to notify his rheumatology provider also.  Discussed with patient I would read further information on this medication and notify him if I found any information that could be helpful.  Patient A&Ox3 spoke full sentences without difficulty was going to lunch room gait sure and steady skin warm dry and pink gait sure and steady respirations even and unlabored.  Patient verbalized understanding information/instructions had no further questions at this time.

## 2021-11-29 ENCOUNTER — Other Ambulatory Visit: Payer: No Typology Code available for payment source

## 2021-11-29 ENCOUNTER — Other Ambulatory Visit: Payer: Self-pay

## 2021-11-29 DIAGNOSIS — Q2112 Patent foramen ovale: Secondary | ICD-10-CM

## 2021-11-29 DIAGNOSIS — I7121 Aneurysm of the ascending aorta, without rupture: Secondary | ICD-10-CM

## 2021-11-29 DIAGNOSIS — I10 Essential (primary) hypertension: Secondary | ICD-10-CM

## 2021-11-29 DIAGNOSIS — Z0189 Encounter for other specified special examinations: Secondary | ICD-10-CM

## 2021-11-29 NOTE — Telephone Encounter (Signed)
Reviewed prescriber drug insert for orencia and up to date.  Flushing, hypertension, allergic reaction and infection risk (cellulitis) possible with orencia use.  Patient notified and instructed to check blood pressure if having flushing at home or come to clinic if at work.  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

## 2021-11-30 ENCOUNTER — Inpatient Hospital Stay: Admission: RE | Admit: 2021-11-30 | Payer: No Typology Code available for payment source | Source: Ambulatory Visit

## 2021-11-30 ENCOUNTER — Telehealth: Payer: Self-pay | Admitting: Internal Medicine

## 2021-11-30 DIAGNOSIS — I422 Other hypertrophic cardiomyopathy: Secondary | ICD-10-CM

## 2021-11-30 LAB — BASIC METABOLIC PANEL
BUN/Creatinine Ratio: 20 (ref 10–24)
BUN: 35 mg/dL — ABNORMAL HIGH (ref 8–27)
CO2: 17 mmol/L — ABNORMAL LOW (ref 20–29)
Calcium: 10 mg/dL (ref 8.6–10.2)
Chloride: 106 mmol/L (ref 96–106)
Creatinine, Ser: 1.78 mg/dL — ABNORMAL HIGH (ref 0.76–1.27)
Glucose: 113 mg/dL — ABNORMAL HIGH (ref 70–99)
Potassium: 3.9 mmol/L (ref 3.5–5.2)
Sodium: 138 mmol/L (ref 134–144)
eGFR: 42 mL/min/{1.73_m2} — ABNORMAL LOW (ref >59)

## 2021-11-30 NOTE — Telephone Encounter (Signed)
Called pt informed that I contacted [facility] and BNP and alpha galactosidase can not be drawn from blood collected 11/29/21.  Pt will come in for labs on 12/05/21.  Pt had no questions or concerns.

## 2021-11-30 NOTE — Telephone Encounter (Signed)
Pt reached out returning a phone call to [employer]. She reached back  out to me informing me of the following:  "Baron Sane,  Patient was calling me for assistance.  Dr Izora Ribas saw him and ordered - in one week we recommended an alpha galactosidase Fabry's eval (though CMR parametric mapping not consistent), a BMP, BMP and Mg.  When he looked in my chart only the BMET was drawn at cardiology not the others.  We cannot do alpha galactosidase at this location for him and he didn't want to have multiple blood draws.  Can you please let the nurse know that is why he was calling--I don't think [facility] can be called to add on alpha galactosidase but your provider would know better--this lab not something we draw at [employer].  Sincerely,  Albina Billet NP-C"   Please Advise

## 2021-12-04 NOTE — Telephone Encounter (Signed)
Reviewed Epic patient cardiology provider office followed up with patient and required scheduling another lab draw pending.

## 2021-12-05 ENCOUNTER — Other Ambulatory Visit: Payer: Self-pay

## 2021-12-05 ENCOUNTER — Other Ambulatory Visit: Payer: No Typology Code available for payment source | Admitting: *Deleted

## 2021-12-05 DIAGNOSIS — I422 Other hypertrophic cardiomyopathy: Secondary | ICD-10-CM

## 2021-12-05 NOTE — Telephone Encounter (Signed)
Spoke with patient in workcenter.  Stated cardiology discussed worsening creatinine with him.  Discussed lasix probably cause of elevation and continuing to monitor closely.  Patient reported weight has been steady 235lbs and drinking his 2L water per day and not going over. Cardiology lab draw rescheduled with provider's office.  Patient also reported this weekend during cold front and below zero windchill knees hurting a great deal.  Used heating pad but not neoprene sleeves.  Reminded patient they provider compression support and warmth to help with arthritis.  Patient stated he will start rewearing them tomorrow.  Patient joked he needs to move to warmer weather climate.  Patient A&Ox3 spoke full sentences walking across warehouse skin warm dry and pink respirations even and unlabored RA.  Gait sure and steady.  No wheezing or shortness of breath/congestion/cough or throat clearing noted.  Patient verbalized understanding information/instructions, agreed with plan of care and had no further questions at this time.

## 2021-12-07 ENCOUNTER — Other Ambulatory Visit: Payer: Self-pay | Admitting: *Deleted

## 2021-12-07 ENCOUNTER — Ambulatory Visit: Payer: Self-pay | Admitting: Registered Nurse

## 2021-12-07 ENCOUNTER — Other Ambulatory Visit: Payer: Self-pay

## 2021-12-07 ENCOUNTER — Encounter: Payer: Self-pay | Admitting: Registered Nurse

## 2021-12-07 VITALS — BP 133/92 | HR 80 | Temp 97.8°F

## 2021-12-07 DIAGNOSIS — I422 Other hypertrophic cardiomyopathy: Secondary | ICD-10-CM

## 2021-12-07 DIAGNOSIS — H6982 Other specified disorders of Eustachian tube, left ear: Secondary | ICD-10-CM

## 2021-12-07 DIAGNOSIS — J4521 Mild intermittent asthma with (acute) exacerbation: Secondary | ICD-10-CM

## 2021-12-07 DIAGNOSIS — R590 Localized enlarged lymph nodes: Secondary | ICD-10-CM

## 2021-12-07 DIAGNOSIS — R7989 Other specified abnormal findings of blood chemistry: Secondary | ICD-10-CM

## 2021-12-07 MED ORDER — AMOXICILLIN-POT CLAVULANATE 875-125 MG PO TABS: 1.0000 | ORAL_TABLET | Freq: Two times a day (BID) | ORAL | 0 refills | Status: AC

## 2021-12-07 MED ORDER — PREDNISONE 10 MG PO TABS: ORAL_TABLET | ORAL | 0 refills | Status: DC

## 2021-12-07 NOTE — Progress Notes (Signed)
65y/o established Hispanic male   Subjective:    Patient ID: Ryan Glass, male    DOB: August 08, 1956, 65 y.o.   MRN: [id number]  65y/o Ryan Glass married male established patient here for evaluation swelling around AICD implant left chest and left ear intermittent hurting swelling below ear in neck. "Usually the device very hard and square in my chest and now swelling around it"  Has noted fatigue in the past week after work.  Forgot to put on knee neoprene sleeves for work today.  Still using heating pad at night with automatic turn off when Ryan Glass goes to bed and turns on again when Ryan Glass wakes up to urinate in the middle of the night and that has been helping a lot during this cold weather for his knee arthritis.  Weights stable 235lbs.  Denied palpitations/dizziness/fever/chills/URI symptoms/ear discharge/shortness of breath.      Review of Systems  Constitutional:  Positive for fatigue. Negative for activity change, appetite change, chills, diaphoresis, fever and unexpected weight change.  HENT:  Positive for ear pain. Negative for congestion, drooling, ear discharge, facial swelling, hearing loss, mouth sores, nosebleeds, postnasal drip, rhinorrhea, sinus pressure, sinus pain, sneezing, sore throat, tinnitus, trouble swallowing and voice change.   Eyes:  Negative for photophobia, pain, discharge, redness, itching and visual disturbance.  Respiratory:  Negative for cough, shortness of breath, wheezing and stridor.   Cardiovascular:  Negative for chest pain.  Gastrointestinal:  Negative for constipation, diarrhea and vomiting.  Endocrine: Negative for cold intolerance and heat intolerance.  Genitourinary:  Negative for difficulty urinating.  Musculoskeletal:  Positive for arthralgias, myalgias and neck pain. Negative for neck stiffness.  Skin:  Negative for rash.  Allergic/Immunologic: Positive for environmental allergies. Negative for food allergies.  Neurological:  Negative for dizziness, tremors,  seizures, syncope, facial asymmetry, speech difficulty, weakness, light-headedness, numbness and headaches.  Hematological:  Negative for adenopathy. Bruises/bleeds easily.  Psychiatric/Behavioral:  Negative for agitation, confusion and sleep disturbance.       Objective:   Physical Exam Vitals and nursing note reviewed.  Constitutional:      General: Ryan Glass is awake. Ryan Glass is not in acute distress.    Appearance: Normal appearance. Ryan Glass is well-developed, well-groomed and overweight. Ryan Glass is not ill-appearing, toxic-appearing or diaphoretic.  HENT:     Head: Normocephalic and atraumatic.     Jaw: There is normal jaw occlusion. No trismus, tenderness, swelling, pain on movement or malocclusion.     Salivary Glands: Right salivary gland is not diffusely enlarged or tender. Left salivary gland is not diffusely enlarged or tender.     Right Ear: Hearing, ear canal and external ear normal. No decreased hearing noted. No drainage, swelling or tenderness. A middle ear effusion is present. There is no impacted cerumen. No mastoid tenderness. No PE tube. No hemotympanum. Tympanic membrane is not injected, scarred, perforated, erythematous, retracted or bulging.     Left Ear: Hearing, ear canal and external ear normal. No decreased hearing noted. No drainage, swelling or tenderness. A middle ear effusion is present. There is no impacted cerumen. No mastoid tenderness. No PE tube. No hemotympanum. Tympanic membrane is not injected, scarred, perforated, erythematous, retracted or bulging.     Ears:      Comments: No pain with tragal or pinnal movement    Nose: Nose normal. No congestion or rhinorrhea.     Right Turbinates: Not enlarged, swollen or pale.     Left Turbinates: Not enlarged, swollen or pale.     Right  Sinus: No maxillary sinus tenderness or frontal sinus tenderness.     Left Sinus: No maxillary sinus tenderness or frontal sinus tenderness.     Mouth/Throat:     Lips: Pink. No lesions.     Mouth:  Mucous membranes are moist. No lacerations, oral lesions or angioedema.     Dentition: No dental abscesses or gum lesions.     Tongue: No lesions. Tongue does not deviate from midline.     Palate: No mass and lesions.     Pharynx: Uvula midline. Pharyngeal swelling and posterior oropharyngeal erythema present. No oropharyngeal exudate or uvula swelling.     Tonsils: No tonsillar exudate or tonsillar abscesses. 0 on the right. 0 on the left.     Comments: Cobblestoning posterior pharynx; bilateral TMs air fluid level clear; bilateral allergic shiners; lower eyelids 1+/4 nonpitting edema; clear discharge bilateral turbinates nasal  Eyes:     General: Lids are normal. Vision grossly intact. Gaze aligned appropriately. Allergic shiner present. No scleral icterus.       Right eye: No discharge.        Left eye: No discharge.     Extraocular Movements: Extraocular movements intact.     Conjunctiva/sclera: Conjunctivae normal.     Pupils: Pupils are equal, round, and reactive to light.  Neck:     Vascular: Normal carotid pulses.     Trachea: Trachea and phonation normal. No tracheal tenderness or tracheal deviation.   Cardiovascular:     Rate and Rhythm: Normal rate and regular rhythm.     Pulses: Normal pulses.          Radial pulses are 2+ on the right side and 2+ on the left side.     Heart sounds: Normal heart sounds.     Comments: Scant bilateral lower extremity edema sock line only (less than usual 1-2+/4 depending on time of day for this patient baseline) Pulmonary:     Effort: Pulmonary effort is normal. No respiratory distress.     Breath sounds: Normal breath sounds and air entry. No stridor, decreased air movement or transmitted upper airway sounds. No decreased breath sounds, wheezing, rhonchi or rales.     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  Taking a breath between every couple of words today on initial arrival.  Improved after sitting in chair for blood pressure 5 minutes.  Sp02 RA did not return to baseline 95-96% during time in clinic.  Patient unsure if albuterol at his desk but if available will take 2 puffs otherwise will administer as sooner as arrives home.  Took 20mg  prednisone oral in clinic first dose of prescribed taper.  Patient had already taken 5mg  this am at home for rheumatology condition. Chest:     Chest wall: Swelling and edema present. No lacerations, tenderness or crepitus. There is no dullness to percussion.       Comments: Nondiscrete borders swelling 1-2+/4 nonpitting left upper anterior chest surrounding AICD implant; no increased temperature/erythema/crepitus/tenderness Abdominal:     Palpations: Abdomen is soft.  Musculoskeletal:        General: Swelling present. No tenderness, deformity or signs of injury. Normal range of motion.     Right shoulder: No swelling, laceration or tenderness. Normal strength.     Left shoulder: No swelling, laceration or tenderness. Normal strength.     Right elbow: No swelling or  lacerations. Normal range of motion.     Left elbow: No swelling or lacerations. Normal range of motion.     Right hand: No swelling. Normal strength. Normal capillary refill.     Left hand: No swelling. Normal strength. Normal capillary refill.     Cervical back: Normal range of motion and neck supple. Swelling and edema present. No deformity, erythema, signs of trauma, lacerations, rigidity, spasms, torticollis, tenderness or crepitus. Pain with movement present. No muscular tenderness. Normal range of motion.     Thoracic back: No swelling, edema, deformity, signs of trauma or lacerations.     Right lower leg: No tenderness. Edema present.     Left lower leg: No tenderness. Edema present.  Lymphadenopathy:     Head:     Right side of head: No submandibular or preauricular  adenopathy.     Left side of head: No submandibular or preauricular adenopathy.     Cervical: Cervical adenopathy present.     Right cervical: No superficial or deep cervical adenopathy.    Left cervical: Superficial cervical adenopathy present. No deep cervical adenopathy.     Upper Body:     Right upper body: No supraclavicular, axillary or pectoral adenopathy.     Left upper body: No supraclavicular, axillary or pectoral adenopathy.  Skin:    General: Skin is warm and dry.     Capillary Refill: Capillary refill takes less than 2 seconds.     Coloration: Skin is not ashen, cyanotic, jaundiced, mottled, pale or sallow.     Findings: No abrasion, bruising, burn, ecchymosis, erythema, signs of injury, laceration, lesion, petechiae, rash or wound.     Nails: There is no clubbing.  Neurological:     General: No focal deficit present.     Mental Status: Ryan Glass is alert and oriented to person, place, and time. Mental status is at baseline.     GCS: GCS eye subscore is 4. GCS verbal subscore is 5. GCS motor subscore is 6.     Cranial Nerves: Cranial nerves 2-12 are intact. No cranial nerve deficit, dysarthria or facial asymmetry.     Sensory: Sensation is intact. No sensory deficit.     Motor: Motor function is intact. No weakness, tremor, atrophy, abnormal muscle tone or seizure activity.     Coordination: Coordination is intact. Coordination normal.     Gait: Gait is intact. Gait normal.     Comments: In/out of chair without difficulty; gait sure and steady in clinic; bilateral hand grasp equal 5/5  Psychiatric:        Attention and Perception: Attention and perception normal.        Mood and Affect: Mood and affect normal.        Speech: Speech normal.        Behavior: Behavior normal. Behavior is cooperative.        Thought Content: Thought content normal.        Cognition and Memory: Cognition and memory normal.        Judgment: Judgment normal.    2209 left message for patient at work  notified Dr [name] would like to see patient in office and patient should call Dr [name] office for appt date and time regarding swelling around AICD left anterior chest.     Assessment & Plan:  A-localized enlarged lymph nodes, reactive airway disease mild with acute exacerbation, eustachian tube dysfunction left  P-Patient came to [facility] today puffy 1+/4 nonpitting edema around AICD implant today  and left anterior cervical lymph nodes swollen not hot to touch or any rash  Sp02 decreased from baseline 90% and less with exertion but returns to 90-91% at rest  on chart review 94-96% earlier this month for cardiology appts x 3 office visits; BBS CTA; no URI symptoms; I started him on augmentin 875mg  po BID x 10 days #20 RF0 dispensed from [facility] to patient and prednisone additional 20mg  x 2 days then 10mg x2days then 5mg x2 days dispensed prednisone 10mg  from [facility] #21 RF0 to patient today and started first doses in clinic.  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  Sees pulmonology Dr .  Has not used albuterol inhaler today only maintenance inhaler. Instructed him to use albuterol 2 puffs on a 6h schedule when awake the next 48 hours. Instructed him to transmit his AICD device tonight also as reporting fatigue in evenings after work for the past week.  Check BP at home if feeling fatigued along with home sp02 monitor.  Patient to contact me if below sp02 90% with albuterol and prednisone use. Patient with history of bradycardia and hypotension resulting in fatigue also.  VSS in clinic today no arrhythmia on exam.  EKG not available in this clinic so not performed.  Pulse RRR radial and apical.  Weight stable/denied palpitations/afebrile/lower leg edema scant sock line today which is very good for him.  Some fluid in ears but not infected so some of pain could be eustachian tube dysfunction left.  Since swelling around AICD implant new  Dr sent secure message notification. Exitcare handouts given on COPD exacerbation, lymphadenopathy and eustachian tube dysfunction.   Discussed if home sp02 consistently less than 90% will need to be seen at pulmonology/ER.   If fever/chills/worsening swelling in chest/new rash/red streaks or difficulty breathing that does not resolve with rest/inhaler needs to be re-evaluated at ER same day. Patient to follow up with RN for re-evaluation tomorrow of swelling lymph nodes and around AICD and next NP clinic Tuesday 12 Dec 2021  Patient has work from home number to call [phone] if questions or concerns when clinic closed or email [email or web address].  Patient verbalized understanding information/instructions, agreed with plan of care and had no further questions at this time.   No evidence of invasive bacterial infection, non toxic and well hydrated.  I do not see where any further testing or imaging is necessary at this time.   I will suggest supportive care, rest, good hygiene and encourage the patient to take adequate fluids.  The patient is to return to clinic or EMERGENCY ROOM if symptoms worsen or change significantly e.g. ear pain, fever, purulent discharge from ears or bleeding.  Exitcare handout on otitis media and eustachian tube dysfunction printed and given to patient.  Discussed with patient post nasal drip irritates throat/causes swelling blocks eustachian tubes from draining and fluid fills up middle ear.  Bacteria/viruses can grow in fluid and with moving head tube compressed and increases pressure in tube/ear worsening pain.  Studies show will take 30 days for fluid to resolve after post nasal drip controlled with nasal steroid/antihistamine. Antibiotics and steroids do not speed up fluid removal.  Patient verbalized agreement and understanding of treatment plan and had no further questions at this time.

## 2021-12-07 NOTE — Patient Instructions (Signed)
Chronic Obstructive Pulmonary Disease Exacerbation Chronic obstructive pulmonary disease (COPD) is a long-term (chronic) condition that affects the lungs. COPD is a general term that can be used to describe many different lung problems that cause lung inflammation and limit airflow, including chronic bronchitis and emphysema. COPD exacerbations are episodes when breathing symptoms flare up, become much worse, and require extra treatment. COPD exacerbations are usually caused by infections. Without treatment, COPD exacerbations can be severe and even life threatening. Frequent COPD exacerbations can cause further damage to the lungs. What are the causes? This condition may be caused by: Respiratory infections, including viral and bacterial infections. Exposure to smoke. Exposure to air pollution, chemical fumes, or dust. Things that can cause an allergic reaction (allergens). Not taking your usual COPD medicines as directed. Underlying medical problems, such as congestive heart failure or infections not involving the lungs. In many cases, the cause of this condition is not known. What increases the risk? The following factors may make you more likely to develop this condition: Smoking cigarettes. Being an older adult. Having frequent prior COPD exacerbations. What are the signs or symptoms? Symptoms of this condition include: Increased coughing. Increased production of mucus from your lungs. Increased wheezing and shortness of breath. Rapid or labored breathing. Chest tightness. Less energy than usual. Sleep disruption from symptoms. Confusion Increased sleepiness. Often, these symptoms happen or get worse even with the use of medicines. How is this diagnosed? This condition is diagnosed based on: Your medical history. A physical exam. You may also have tests, including: A chest X-ray. Blood tests. Lung (pulmonary) function tests. How is this treated? Treatment for this condition  depends on the severity and cause of the symptoms. You may need to be admitted to a hospital for treatment. Some of the treatments commonly used to treat COPD exacerbations are: Antibiotic medicines. These may be used for severe exacerbations caused by a lung infection, such as pneumonia. Bronchodilators. These are inhaled medicines that expand the air passages and allow increased airflow. They may make your breathing more comfortable. Steroid medicines. These act to reduce inflammation in the airways. They may be given with an inhaler, taken by mouth, or given through an IV tube inserted into one of your veins. Supplemental oxygen therapy. Airway clearing techniques, such as noninvasive ventilation (NIV) and positive expiratory pressure (PEP). These provide respiratory support through a mask or other noninvasive device. An example of this would be using a continuous positive airway pressure (CPAP) machine to improve delivery of oxygen into your lungs. Follow these instructions at home: Medicines Take over-the-counter and prescription medicines only as told by your health care provider. It is important to use correct technique with inhaled medicines. If you were prescribed an antibiotic medicine or oral steroid, take it as told by your health care provider. Do not stop taking the medicine even if you start to feel better. Lifestyle Do not use any products that contain nicotine or tobacco. These products include cigarettes, chewing tobacco, and vaping devices, such as e-cigarettes. If you need help quitting, ask your health care provider. Eat a healthy diet. Exercise regularly. Get enough sleep. Most adults need 7 or more hours per night. Avoid exposure to all substances that irritate the airway, especially tobacco smoke. Regularly wash your hands with soap and water for at least 20 seconds. If soap and water are not available, use hand sanitizer. This may help prevent you from getting  infections. During flu season, avoid enclosed spaces that are crowded with people. General  instructions Drink enough fluid to keep your urine pale yellow, unless you have a medical condition that requires fluid restriction. Use a cool mist vaporizer. This humidifies the air and makes it easier for you to clear your chest when you cough. If you have a home nebulizer and oxygen, continue to use them as told by your health care provider. Keep all follow-up visits. This is important. How is this prevented? Stay up-to-date on pneumococcal and flu (influenza) vaccines. A flu shot is recommended every year to help prevent exacerbations. Quitting smoking is very important in preventing COPD from getting worse and in preventing exacerbations from happening as often. Follow all instructions for pulmonary rehabilitation after a recent exacerbation. This can help prevent future exacerbations. Work with your health care provider to develop and follow an action plan. This tells you what steps to take when you experience certain symptoms. Contact a health care provider if: You have a worsening of your regular COPD symptoms. Get help right away if: You have worsening shortness of breath, even when resting. You have trouble talking. You have severe chest pain. You cough up blood. You have a fever. You have weakness, vomit repeatedly, or faint. You feel confused. You are not able to sleep because of your symptoms. You have trouble doing daily activities. These symptoms may represent a serious problem that is an emergency. Do not wait to see if the symptoms will go away. Get medical help right away. Call your local emergency services (911 in the [place] Do not drive yourself to the hospital. Summary COPD exacerbations are episodes when breathing symptoms become much worse and require extra treatment above your normal treatment. Exacerbations can be severe and even life threatening. Frequent COPD exacerbations  can cause further damage to your lungs. COPD exacerbations are usually triggered by infections such as the flu, colds, and even pneumonia. Treatment for this condition depends on the severity and cause of the symptoms. You may need to be admitted to a hospital for treatment. Quitting smoking is very important to prevent COPD from getting worse and to prevent exacerbations from happening as often. This information is not intended to replace advice given to you by your health care provider. Make sure you discuss any questions you have with your health care provider. Document Revised: 10/04/2020 Document Reviewed: 10/04/2020 Elsevier Patient Education  2022 [employer] Eustachian Tube Dysfunction Eustachian tube dysfunction refers to a condition in which a blockage develops in the narrow passage that connects the middle ear to the back of the nose (eustachian tube). The eustachian tube regulates air pressure in the middle ear by letting air move between the ear and nose. It also helps to drain fluid from the middle ear space. Eustachian tube dysfunction can affect one or both ears. When the eustachian tube does not function properly, air pressure, fluid, or both can build up in the middle ear. What are the causes? This condition occurs when the eustachian tube becomes blocked or cannot open normally. Common causes of this condition include: Ear infections. Colds and other infections that affect the nose, mouth, and throat (upper respiratory tract). Allergies. Irritation from cigarette smoke. Irritation from stomach acid coming up into the esophagus (gastroesophageal reflux). The esophagus is the part of the body that moves food from the mouth to the stomach. Sudden changes in air pressure, such as from descending in an airplane or scuba diving. Abnormal growths in the nose or throat, such as: Growths that line the nose (nasal polyps). Abnormal growth  of cells (tumors). Enlarged tissue at the  back of the throat (adenoids). What increases the risk? You are more likely to develop this condition if: You smoke. You are overweight. You are a child who has: Certain birth defects of the mouth, such as cleft palate. Large tonsils or adenoids. What are the signs or symptoms? Common symptoms of this condition include: A feeling of fullness in the ear. Ear pain. Clicking or popping noises in the ear. Ringing in the ear (tinnitus). Hearing loss. Loss of balance. Dizziness. Symptoms may get worse when the air pressure around you changes, such as when you travel to an area of high elevation, fly on an airplane, or go scuba diving. How is this diagnosed? This condition may be diagnosed based on: Your symptoms. A physical exam of your ears, nose, and throat. Tests, such as those that measure: The movement of your eardrum. Your hearing (audiometry). How is this treated? Treatment depends on the cause and severity of your condition. In mild cases, you may relieve your symptoms by moving air into your ears. This is called "popping the ears." In more severe cases, or if you have symptoms of fluid in your ears, treatment may include: Medicines to relieve congestion (decongestants). Medicines that treat allergies (antihistamines). Nasal sprays or ear drops that contain medicines that reduce swelling (steroids). A procedure to drain the fluid in your eardrum. In this procedure, a small tube may be placed in the eardrum to: Drain the fluid. Restore the air in the middle ear space. A procedure to insert a balloon device through the nose to inflate the opening of the eustachian tube (balloon dilation). Follow these instructions at home: Lifestyle Do not do any of the following until your health care provider approves: Travel to high altitudes. Fly in airplanes. Work in a Estate agent or room. Scuba dive. Do not use any products that contain nicotine or tobacco. These products  include cigarettes, chewing tobacco, and vaping devices, such as e-cigarettes. If you need help quitting, ask your health care provider. Keep your ears dry. Wear fitted earplugs during showering and bathing. Dry your ears completely after. General instructions Take over-the-counter and prescription medicines only as told by your health care provider. Use techniques to help pop your ears as recommended by your health care provider. These may include: Chewing gum. Yawning. Frequent, forceful swallowing. Closing your mouth, holding your nose closed, and gently blowing as if you are trying to blow air out of your nose. Keep all follow-up visits. This is important. Contact a health care provider if: Your symptoms do not go away after treatment. Your symptoms come back after treatment. You are unable to pop your ears. You have: A fever. Pain in your ear. Pain in your head or neck. Fluid draining from your ear. Your hearing suddenly changes. You become very dizzy. You lose your balance. Get help right away if: You have a sudden, severe increase in any of your symptoms. Summary Eustachian tube dysfunction refers to a condition in which a blockage develops in the eustachian tube. It can be caused by ear infections, allergies, inhaled irritants, or abnormal growths in the nose or throat. Symptoms may include ear pain or fullness, hearing loss, or ringing in the ears. Mild cases are treated with techniques to unblock the ears, such as yawning or chewing gum. More severe cases are treated with medicines or procedures. This information is not intended to replace advice given to you by your health care provider. Make sure you  discuss any questions you have with your health care provider. Document Revised: 02/06/2021 Document Reviewed: 02/06/2021 Elsevier Patient Education  2022 [employer] Lymphadenopathy Lymphadenopathy means that your lymph glands are swollen or larger than normal. Lymph  glands, also called lymph nodes, are collections of tissue that filter excess fluid, bacteria, viruses, and waste from your bloodstream. They are part of your body's disease-fighting system (immune system), which protects your body from germs. There may be different causes of lymphadenopathy, depending on where it is in your body. Some types go away on their own. Lymphadenopathy can occur anywhere that you have lymph glands, including these areas: Neck (cervical lymphadenopathy). Chest (mediastinal lymphadenopathy). Lungs (hilar lymphadenopathy). Underarms (axillary lymphadenopathy). Groin (inguinal lymphadenopathy). When your immune system responds to germs, infection-fighting cells and fluid build up in your lymph glands. This causes some swelling and enlargement. If the lymph nodes do not go back to normal size after you have an infection or disease, your health care provider may do tests. These tests help to monitor your condition and find the reason why the glands are still swollen and enlarged. Follow these instructions at home:  Get plenty of rest. Your health care provider may recommend over-the-counter medicines for pain. Take over-the-counter and prescription medicines only as told by your health care provider. If directed, apply heat to swollen lymph glands as often as told by your health care provider. Use the heat source that your health care provider recommends, such as a moist heat pack or a heating pad. Place a towel between your skin and the heat source. Leave the heat on for 20-30 minutes. Remove the heat if your skin turns bright red. This is especially important if you are unable to feel pain, heat, or cold. You may have a greater risk of getting burned. Check your affected lymph glands every day for changes. Check other lymph gland areas as told by your health care provider. Check for changes such as: More swelling. Sudden increase in size. Redness or pain. Hardness. Keep  all follow-up visits. This is important. Contact a health care provider if you have: Lymph glands that: Are still swollen after 2 weeks. Have suddenly gotten bigger or the swelling spreads. Are red, painful, or hard. Fluid leaking from the skin near an enlarged lymph gland. Problems with breathing. A fever, chills, or night sweats. Fatigue. A sore throat. Pain in your abdomen. Weight loss. Get help right away if you have: Severe pain. Chest pain. Shortness of breath. These symptoms may represent a serious problem that is an emergency. Do not wait to see if the symptoms will go away. Get medical help right away. Call your local emergency services (911 in the [place] Do not drive yourself to the hospital. Summary Lymphadenopathy means that your lymph glands are swollen or larger than normal. Lymph glands, also called lymph nodes, are collections of tissue that filter excess fluid, bacteria, viruses, and waste from the bloodstream. They are part of your body's disease-fighting system (immune system). Lymphadenopathy can occur anywhere that you have lymph glands. If the lymph nodes do not go back to normal size after you have an infection or disease, your health care provider may do tests to monitor your condition and find the reason why the glands are still swollen and enlarged. Check your affected lymph glands every day for changes. Check other lymph gland areas as told by your health care provider. This information is not intended to replace advice given to you by your health care provider.  Make sure you discuss any questions you have with your health care provider. Document Revised: 09/21/2020 Document Reviewed: 09/21/2020 Elsevier Patient Education  2022 [employer].

## 2021-12-08 ENCOUNTER — Other Ambulatory Visit: Payer: Self-pay | Admitting: Registered Nurse

## 2021-12-08 ENCOUNTER — Ambulatory Visit: Payer: Self-pay | Admitting: *Deleted

## 2021-12-08 VITALS — HR 80

## 2021-12-08 DIAGNOSIS — R590 Localized enlarged lymph nodes: Secondary | ICD-10-CM

## 2021-12-08 DIAGNOSIS — J4541 Moderate persistent asthma with (acute) exacerbation: Secondary | ICD-10-CM

## 2021-12-08 NOTE — Progress Notes (Signed)
Pt in to clinic for f/u L neck swelling. Reports he feels swelling is a little worse today compared to yesterday. RN agrees. Slightly worse. Pt reports increase in pain over L neck though also, especially when turning head to the left. Feels like a pressure that increases. New today is pain in the back of the neck when looking upward. No pain chin to chest. Mild pain/pressure in L neck when turning head to R.  Pt also notes increase in swelling over ICD. Edges of device are less defined than normal, feels worse to him compared to yesterday.  spO2 91-93%  He will call D. Taylor's office today for appt as per NP Inetta Fermo reported Dr. Ladona Ridgel wanting to see pt in office for swelling.

## 2021-12-08 NOTE — Telephone Encounter (Signed)
Patient seen in office 12/07/21 see note and pending cardiology follow up appt.  Patient confirmed he will contact Dr Lubertha Basque office to schedule this am.

## 2021-12-09 ENCOUNTER — Telehealth: Payer: Self-pay | Admitting: Registered Nurse

## 2021-12-09 ENCOUNTER — Encounter: Payer: Self-pay | Admitting: Registered Nurse

## 2021-12-09 DIAGNOSIS — R222 Localized swelling, mass and lump, trunk: Secondary | ICD-10-CM

## 2021-12-09 DIAGNOSIS — R590 Localized enlarged lymph nodes: Secondary | ICD-10-CM

## 2021-12-09 NOTE — Telephone Encounter (Signed)
Reviewed RN Rolly Salter note in Epic for patient follow up in clinic yesterday.  Some worsening of symptoms.  Patient contacted via telephone to follow up response to augmentin 875mg  po BID.  Patient reported has taken 5 doses so far in addition to prednisone for RAD flare.  Sp02 93% RA this am.  Yesterday felt hot at work but no thermometer took temperature when he arrived home and 98.73F.  Woke up this morning and seems more puffy around AICD implant left anterior chest and red streak over his insertion scar which was not there yesterday.  Neck pain a little better but still having decreased range of motion due to pain and swelling. He stated feeling some vibration when touching AICD implant where that did not occur before and swelling/puffy around AICD.  Appt scheduled for Wednesday with his cardiology provider APP next week for evaluation Dr Monday office. Spouse reported can see neck swelling today whereas on Thursday I could only palpate it.  Asked if any shortness of breath, dizziness, headache, fever or chills today and patient denied.  Patient reported had some dizziness walking to car after finishing work shift yesterday but resolved.  Patient A&Ox3 spoke full sentences without difficulty respirations even and unlabored no audible cough/congestion/throat clearing during 10 minute telephone call.  Discussed with patient that swelling should be improving after 48 hours on antibiotics and he may need to go to ER for further evaluation blood/imaging tests this weekend especially if signs of infection worsening.  Discussed with patient I would contact on call cardiology provider and call him back with further instructions.  Patient and spouse verbalized understanding information/instructions, agreed with plan of care and had no further questions at this time.

## 2021-12-09 NOTE — Telephone Encounter (Signed)
Patient contacted via telephone and discussed that vibration from AICD not uncommon and AICD pocket infections rare per cardiology provider especially if fully healed from insertion.  Discussed if worsening signs of infection red streak, fever, chills, worsening swelling/pain he is to go to ER for re-evaluation and further testing this weekend.  Continue taking augmentin and prednisone as dispensed to him on Thursday.  Discussed with patient I would call him again tomorrow to follow up via telephone.  Patient A&Ox3 spoke full sentences without difficulty respirations even and unlabored; no audible cough/congestion/throat clearing during 3 minute telephone call.  Patient notified his fluticasone/salmeterol Rx refilled and sent to [facility] also.  Patient verbalized understanding information/instructions, agreed with plan of care and had no further questions at this time.

## 2021-12-10 NOTE — Telephone Encounter (Signed)
Patient contacted via telephone.  Patient reported today sp02 93% RA this am.  Last night after dinner ate out 88-89% spo2 RA when they got home.  His other symptoms about the same last night and this am.  Denied worsening swelling or redness to chest/neck.  Pain might be a little better in neck today.  Patient tried to do some work around house and had to go in basement to get tools and got short of breath this am.  Discussed with patient to not decrease to 10mg  prednisone today but continue at 20mg  today and tomorrow.  Patient checked weight 236lbs at bedtime last night.  Taking lasix every day weight stable 235-236lbs this past week.  Has noticed a little bit more short of breath with exertion today.  Knee pain continues and puts hand on wall when walking in house to give himself support.  Encouraged neoprene knee sleeve use and continue rheumatology medication as directed by his specialist for rheumatoid arthritis.  Denied dizziness/headache or fever.  Checked temperature last night night and normal 98  Redness over AICD scar same as yesterday.  Still having pain with looking towards ceiling or rotation left.  Discussed with patient again ER today if fever/chills/worsening shortness of breath/sp02 staying less than 90% after albuterol/prednisone, chest redness spreading or worsening swelling chest/neck.  Patient spoke full sentences without difficulty; no cough/congestion/throat clearing audible during 8 minute telephone call.  Patient to see RN tomorrow in clinic for re-evaluation.  Patient verbalized understanding information/instructions, agreed with plan of care and had no further questions at this time.  RN Redge Gainer notified to do re-evaluation of patient 12/11/2021 neck/chest/breath sounds and sp02 in the am and to contact me if worsening.

## 2021-12-11 ENCOUNTER — Other Ambulatory Visit: Payer: Self-pay

## 2021-12-11 ENCOUNTER — Ambulatory Visit: Payer: Self-pay | Admitting: *Deleted

## 2021-12-11 VITALS — BP 136/93 | HR 80

## 2021-12-11 DIAGNOSIS — R222 Localized swelling, mass and lump, trunk: Secondary | ICD-10-CM

## 2021-12-11 DIAGNOSIS — R590 Localized enlarged lymph nodes: Secondary | ICD-10-CM

## 2021-12-11 NOTE — Progress Notes (Signed)
Swelling in L neck has improved some over the weekend. Swelling trace in neck, 1+ from below L clavicle to top if ICD. Borders of ICD more well-defined over top half compared to Friday. Less puffy. Bottom half with swelling similar to Friday. Borders less defined than top but still able to easily palpate device. Pt denies pain over device. Does endorse some tightness still with turning head to L but reports this is much improved from last week. Able to move head to right, upwards and down to chest without pain or decreased ROM.  Has used both advair and albuterol inhalers this morning, 92% spO2 in clinic. Lowest reading while talking was 91%. Questioning if he needs to attend cardiology appt Wed as he has work meeting before and PT that evening and it will be a busy day. Strongly advised pt to keep the appt and proceed to ER if any worsening sx, ShOB, chest pain. He is agreeable. Will f/u again tomorrow for recheck.

## 2021-12-11 NOTE — Telephone Encounter (Signed)
Spoke with RN [name] via telephone and patient neck/chest swelling decreased today and sp02 stable 91% RA in clinic with nurse today.  Patient has appt in 48 hours with cardiology.  I will re-evaluate him again tomorrow 12/12/2021.

## 2021-12-12 ENCOUNTER — Ambulatory Visit: Payer: Self-pay | Admitting: Registered Nurse

## 2021-12-12 ENCOUNTER — Encounter: Payer: Self-pay | Admitting: Registered Nurse

## 2021-12-12 VITALS — BP 129/95 | HR 80

## 2021-12-12 DIAGNOSIS — J4521 Mild intermittent asthma with (acute) exacerbation: Secondary | ICD-10-CM

## 2021-12-12 DIAGNOSIS — R590 Localized enlarged lymph nodes: Secondary | ICD-10-CM

## 2021-12-12 DIAGNOSIS — R222 Localized swelling, mass and lump, trunk: Secondary | ICD-10-CM

## 2021-12-12 MED ORDER — LORATADINE 10 MG PO TABS
10.0000 mg | ORAL_TABLET | Freq: Every day | ORAL | 11 refills | Status: DC
Start: 2021-12-12 — End: 2022-01-04

## 2021-12-12 MED ORDER — PREDNISONE 10 MG PO TABS: ORAL_TABLET | ORAL | 0 refills | Status: AC

## 2021-12-12 NOTE — Telephone Encounter (Signed)
See office note dated 12/12/2021 swelling improved; spo2 still not at baseline prednisone increased to 20mg  for additional 3 days; patient has follow up with cardiology 12/13/2021

## 2021-12-12 NOTE — Progress Notes (Signed)
Subjective:    Patient ID: Ryan Glass, male    DOB: [birth year], 66 y.o.   MRN: [id number]  65y/o Hispanic established male pt presenting for f/u of L neck and chest swelling. "My lower eyelids more swollen today"  "Neck pain/swelling and chest swelling improved a little today.  Especially my chest not as puffy around AICD."  Denied fever/chills/palpitations/headache, sinus pain/tenderness, dental pain, shortness of breath or cough.  Using his albuterol and fluticasone/salmeterol inhalers.  Today 10mg  prednisone this am tapering down.  Weight steady on home scale 236lbs.  Still taking augmentin 875mg  twice a day.  Last office visit with NP 12/29.  Cardiology appt Dr [name] scheduled for tomorrow f/u swelling around AICD, along with Dr Turner/chest CT and cardiac rehab.  Patient tried phenylephrine OTC from clinic stock yesterday for post nasal drip helping a little.  Not taking any allergy medication at this time.  Using nasal saline 2-3 times per day.     Review of Systems  Constitutional:  Positive for fatigue. Negative for activity change, appetite change, chills, diaphoresis and fever.  HENT:  Positive for ear pain and postnasal drip. Negative for congestion, dental problem, ear discharge, facial swelling, hearing loss, mouth sores, nosebleeds, sinus pressure, sinus pain, sore throat, tinnitus, trouble swallowing and voice change.   Eyes:  Negative for photophobia, pain, discharge, redness, itching and visual disturbance.  Respiratory:  Negative for cough, choking, shortness of breath, wheezing and stridor.   Cardiovascular:  Negative for chest pain, palpitations and leg swelling.  Gastrointestinal:  Negative for abdominal pain, diarrhea, nausea and vomiting.  Endocrine: Negative for cold intolerance and heat intolerance.  Genitourinary:  Negative for difficulty urinating.  Musculoskeletal:  Positive for arthralgias, myalgias and neck pain. Negative for gait problem and neck stiffness.   Skin:  Negative for color change and rash.  Allergic/Immunologic: Positive for environmental allergies and immunocompromised state. Negative for food allergies.  Neurological:  Negative for dizziness, tremors, seizures, syncope, facial asymmetry, speech difficulty, weakness, light-headedness, numbness and headaches.  Hematological:  Negative for adenopathy. Does not bruise/bleed easily.  Psychiatric/Behavioral:  Negative for agitation, confusion and sleep disturbance.       Objective:   Physical Exam Vitals and nursing note reviewed.  Constitutional:      General: He is awake. He is not in acute distress.    Appearance: Normal appearance. He is well-developed and well-groomed. He is obese. He is not ill-appearing, toxic-appearing or diaphoretic.  HENT:     Head: Normocephalic and atraumatic.     Jaw: There is normal jaw occlusion. No trismus, tenderness, swelling or pain on movement.     Salivary Glands: Right salivary gland is not diffusely enlarged or tender. Left salivary gland is not diffusely enlarged or tender.     Right Ear: Hearing, ear canal and external ear normal. A middle ear effusion is present.     Left Ear: Hearing, ear canal and external ear normal. A middle ear effusion is present.     Nose: No nasal deformity, septal deviation, signs of injury, laceration, nasal tenderness, mucosal edema, congestion or rhinorrhea.     Right Turbinates: Enlarged. Not pale.     Left Turbinates: Enlarged. Not pale.     Right Sinus: No maxillary sinus tenderness or frontal sinus tenderness.     Left Sinus: No maxillary sinus tenderness or frontal sinus tenderness.     Mouth/Throat:     Lips: Pink. No lesions.     Mouth: Mucous membranes are moist.  Mucous membranes are not pale, not dry and not cyanotic. No lacerations, oral lesions or angioedema.     Dentition: Abnormal dentition. No gingival swelling, dental abscesses or gum lesions.     Tongue: No lesions. Tongue does not deviate from  midline.     Palate: No mass and lesions.     Pharynx: Uvula midline. Pharyngeal swelling and posterior oropharyngeal erythema present. No oropharyngeal exudate or uvula swelling.     Tonsils: No tonsillar exudate or tonsillar abscesses. 0 on the right. 0 on the left.     Comments: Cobblestoning posterior pharynx; bilateral TMs air fluid level clear; bilateral lower eyelids 1-2+/4 nonpitting edema and allergic shiners Eyes:     General: Lids are normal. Vision grossly intact. Gaze aligned appropriately. Allergic shiner present. No scleral icterus.       Right eye: No foreign body, discharge or hordeolum.        Left eye: No foreign body, discharge or hordeolum.     Extraocular Movements:     Right eye: Normal extraocular motion and no nystagmus.     Left eye: Normal extraocular motion and no nystagmus.     Conjunctiva/sclera: Conjunctivae normal.     Right eye: Right conjunctiva is not injected. No chemosis, exudate or hemorrhage.    Left eye: Left conjunctiva is not injected. No chemosis, exudate or hemorrhage.    Pupils: Pupils are equal, round, and reactive to light. Pupils are equal.     Right eye: Pupil is round and reactive.     Left eye: Pupil is round and reactive.  Neck:     Thyroid: No thyroid mass or thyromegaly.     Trachea: Trachea and phonation normal. No tracheal tenderness or tracheal deviation.      Comments: Left anterior cervical lymph node swelling decreased from last exam 1+/4 today and more diffuse today; noted that neck horizontal crease skin obliterated left compared to right today; AROM improved left rotation and extension cervical compared to 12/07/21 Cardiovascular:     Rate and Rhythm: Normal rate and regular rhythm.     Pulses: Normal pulses.          Radial pulses are 2+ on the right side and 2+ on the left side.     Heart sounds: Normal heart sounds, S1 normal and S2 normal. No murmur heard.   No friction rub. No gallop.     Comments: No sock lines this am  ankles Pulmonary:     Effort: Pulmonary effort is normal. No respiratory distress.     Breath sounds: Normal breath sounds and air entry. No stridor, decreased air movement or transmitted upper airway sounds. No decreased breath sounds, wheezing, rhonchi or rales.     Comments: Spoke full sentences without difficulty; no cough observed in exam room; sp02 89-91% while speaking in exam room; patient not seen on arrival to clinic today unable to compare if short of breath after walking across warehouse Chest:     Chest wall: Swelling, tenderness and edema present. No lacerations, deformity or crepitus.       Comments: Left anterior upper chest swelling surrounding AICD improved compared to last week today 0-1+/4 most pronounced distal to AICD today whereas last week superior to AICD; scar  superior to AICD slight erythema noted today warm and dry Abdominal:     General: There is no distension.     Palpations: Abdomen is soft.  Musculoskeletal:        General: No swelling, tenderness, deformity  or signs of injury. Normal range of motion.     Right elbow: No swelling, effusion or lacerations. Normal range of motion.     Left elbow: No swelling, effusion or lacerations. Normal range of motion.     Right hand: No swelling or lacerations. Normal strength.     Left hand: No swelling or lacerations. Normal strength.     Cervical back: Normal range of motion and neck supple. Swelling and edema present. No erythema, signs of trauma, lacerations, rigidity, spasms, torticollis, tenderness or crepitus. Pain with movement present. No spinous process tenderness or muscular tenderness. Normal range of motion.     Thoracic back: No swelling, edema, signs of trauma, lacerations or tenderness.     Right lower leg: No lacerations, tenderness or bony tenderness. No edema.     Left lower leg: No lacerations, tenderness or bony tenderness. No edema.  Lymphadenopathy:     Head:     Right side of head: No submental,  submandibular, tonsillar, preauricular, posterior auricular or occipital adenopathy.     Left side of head: No submental, submandibular, tonsillar, preauricular, posterior auricular or occipital adenopathy.     Cervical: Cervical adenopathy present.     Right cervical: No superficial, deep or posterior cervical adenopathy.    Left cervical: Superficial cervical adenopathy present. No deep or posterior cervical adenopathy.     Upper Body:     Right upper body: No supraclavicular adenopathy.     Left upper body: No supraclavicular adenopathy.  Skin:    General: Skin is warm and dry.     Capillary Refill: Capillary refill takes less than 2 seconds.     Coloration: Skin is not ashen, cyanotic, jaundiced, mottled, pale or sallow.     Findings: Rash present. No abrasion, abscess, acne, bruising, burn, ecchymosis, erythema, signs of injury, laceration, lesion, petechiae or wound. Rash is macular. Rash is not crusting, nodular, papular, purpuric, pustular, scaling, urticarial or vesicular.     Nails: There is no clubbing.       Neurological:     General: No focal deficit present.     Mental Status: He is alert and oriented to person, place, and time. Mental status is at baseline.     GCS: GCS eye subscore is 4. GCS verbal subscore is 5. GCS motor subscore is 6.     Cranial Nerves: Cranial nerves 2-12 are intact. No cranial nerve deficit, dysarthria or facial asymmetry.     Sensory: Sensation is intact. No sensory deficit.     Motor: Motor function is intact. No weakness, tremor, atrophy, abnormal muscle tone or seizure activity.     Coordination: Coordination is intact. Coordination normal.     Gait: Gait is intact. Gait normal.     Comments: Gait sure and steady in clinic; in/out of chair without difficulty; bilateral hand grasp equal 5/5   Psychiatric:        Attention and Perception: Attention and perception normal.        Mood and Affect: Mood and affect normal.        Speech: Speech normal.         Behavior: Behavior normal. Behavior is cooperative.        Thought Content: Thought content normal.        Cognition and Memory: Cognition and memory normal.        Judgment: Judgment normal.          Assessment & Plan:   A-left cervical lymphadenopathy subsequent visit,  chest swelling subsequent visit, reactive airway disease with acute exacerbation subsequent visit  P-Continue augmentin 875mg  po BID until 10 day course completed.  Increase additional prednisone to 20mg  again x 3 days then 10mg x3 days then 5mg  x 4 days as not tolerating tapering down to 10mg  (still has left over tabs from previously dispensed from [facility] to patient).  Continue fluticasone-salmerterol 100-75mcg 1 puff BID and albuterol inhaler 46mcg/act 2 puffs every 4 hours on schedule.  Lymphadenopathy and chest swelling around AICD improving on augmentin but not resolved.  Cardiology Dr [name] appt tomorrow keep as scheduled.  DIscuss with his cardiology provider the vibration he is feeling when touching AICD, along with swelling around AICD last week and intermittent flushing and fatigue that has been occurring over the past month.  Patient to continue monitoring sp02 at home along with daily weights and notifying clinic staff/his specialty providers if new/worsening symptoms.  Lymph node and chest swelling improved today from last week but not resolved.  Sp02 still not back to baseline BBS CTA.  Patient not feeling as short of breath with exertion/speech today as last week.  Keep his scheduled cardiology appts for PT, CT and AICD 12/13/2021.  Patient verbalized understanding information/instructions, agreed with plan of care and had no further questions at this time.  Discussed with patient new lower eyelid swelling related to sinus inflammation today.  Adding claritin 10mg  po daily given 4 UD from clinic stock today as worsening sinus inflammation/lower eyelid swelling and increase nasal saline use to 2 sprays every 2 hours  not TID use.  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  Continue singulair 10mg  po qhs.  May continue phenylephrine 5-10mg  po q6h if helping also.  Augmentin covers for bacterial sinus infection and prednisone helps with inflammation for bacterial/allergic/viral sinusitis also.  Patient verbalized understanding information/instructions, agreed with plan of care and had no further questions at this time

## 2021-12-12 NOTE — Patient Instructions (Signed)
How to Perform a Sinus Rinse °A sinus rinse is a home treatment that is used to rinse your sinuses with a germ-free (sterile) mixture of salt and water (saline solution). Sinuses are air-filled spaces in your skull that are behind the bones of your face and forehead. They open into your nasal cavity. °A sinus rinse can help to clear mucus, dirt, dust, or pollen from your nasal cavity. You may do a sinus rinse when you have a cold, a virus, nasal allergy symptoms, a sinus infection, or stuffiness in your nose or sinuses. °What are the risks? °A sinus rinse is generally safe and effective. However, there are a few risks, which include: °A burning sensation in your sinuses. This may happen if you do not make the saline solution as directed. Be sure to follow all directions when making the saline solution. °Nasal irritation. °Infection. This may be from unclean supplies or from contaminated water. Infection from contaminated water is rare, but possible. °Do not do a sinus rinse if you have had ear or nasal surgery, ear infection, or plugged ears, unless recommended by your health care provider. °Supplies needed: °Saline solution or powder. °Distilled or sterile water to mix with saline powder. °You may use boiled and cooled tap water. Boil tap water for 5 minutes; cool until it is lukewarm. Use within 24 hours. °Do not use regular tap water to mix with the saline solution. °Neti pot or nasal rinse bottle. These supplies release the saline solution into your nose and through your sinuses. Neti pots and nasal rinse bottles can be purchased at your local pharmacy, a health food store, or online. °How to perform a sinus rinse ° °Wash your hands with soap and water for at least 20 seconds. If soap and water are not available, use hand sanitizer. °Wash your device according to the directions that came with the product and then dry it. °Use the solution that comes with your product or one that is sold separately in stores.  Follow the mixing directions on the package to mix with sterile or distilled water. °Fill the device with the amount of saline solution noted in the device instructions. °Stand by a sink and tilt your head sideways over the sink. °Place the spout of the device in your upper nostril (the one closer to the ceiling). °Gently pour or squeeze the saline solution into your nasal cavity. The liquid should drain out from the lower nostril if you are not too congested. °While rinsing, breathe through your open mouth. °Gently blow your nose to clear any mucus and rinse solution. Blowing too hard may cause ear pain. °Turn your head in the other direction and repeat in your other nostril. °Clean and rinse your device with clean water and then air-dry it. °Talk with your health care provider or pharmacist if you have questions about how to do a sinus rinse. °Summary °A sinus rinse is a home treatment that is used to rinse your sinuses with a sterile mixture of salt and water (saline solution). °You may do a sinus rinse when you have a cold, a virus, nasal allergy symptoms, a sinus infection, or stuffiness in your nose or sinuses. °A sinus rinse is generally safe and effective. Follow all instructions carefully. °This information is not intended to replace advice given to you by your health care provider. Make sure you discuss any questions you have with your health care provider. °Document Revised: 05/15/2021 Document Reviewed: 05/15/2021 °Elsevier Patient Education © 2022 [employer] °Sinusitis,   Adult °Sinusitis is inflammation of your sinuses. Sinuses are hollow spaces in the bones around your face. Your sinuses are located: °Around your eyes. °In the middle of your forehead. °Behind your nose. °In your cheekbones. °Mucus normally drains out of your sinuses. When your nasal tissues become inflamed or swollen, mucus can become trapped or blocked. This allows bacteria, viruses, and fungi to grow, which leads to infection. Most  infections of the sinuses are caused by a virus. °Sinusitis can develop quickly. It can last for up to 4 weeks (acute) or for more than 12 weeks (chronic). Sinusitis often develops after a cold. °What are the causes? °This condition is caused by anything that creates swelling in the sinuses or stops mucus from draining. This includes: °Allergies. °Asthma. °Infection from bacteria or viruses. °Deformities or blockages in your nose or sinuses. °Abnormal growths in the nose (nasal polyps). °Pollutants, such as chemicals or irritants in the air. °Infection from fungi (rare). °What increases the risk? °You are more likely to develop this condition if you: °Have a weak body defense system (immune system). °Do a lot of swimming or diving. °Overuse nasal sprays. °Smoke. °What are the signs or symptoms? °The main symptoms of this condition are pain and a feeling of pressure around the affected sinuses. Other symptoms include: °Stuffy nose or congestion. °Thick drainage from your nose. °Swelling and warmth over the affected sinuses. °Headache. °Upper toothache. °A cough that may get worse at night. °Extra mucus that collects in the throat or the back of the nose (postnasal drip). °Decreased sense of smell and taste. °Fatigue. °A fever. °Sore throat. °Bad breath. °How is this diagnosed? °This condition is diagnosed based on: °Your symptoms. °Your medical history. °A physical exam. °Tests to find out if your condition is acute or chronic. This may include: °Checking your nose for nasal polyps. °Viewing your sinuses using a device that has a light (endoscope). °Testing for allergies or bacteria. °Imaging tests, such as an MRI or CT scan. °In rare cases, a bone biopsy may be done to rule out more serious types of fungal sinus disease. °How is this treated? °Treatment for sinusitis depends on the cause and whether your condition is chronic or acute. °If caused by a virus, your symptoms should go away on their own within 10 days.  You may be given medicines to relieve symptoms. They include: °Medicines that shrink swollen nasal passages (topical intranasal decongestants). °Medicines that treat allergies (antihistamines). °A spray that eases inflammation of the nostrils (topical intranasal corticosteroids). °Rinses that help get rid of thick mucus in your nose (nasal saline washes). °If caused by bacteria, your health care provider may recommend waiting to see if your symptoms improve. Most bacterial infections will get better without antibiotic medicine. You may be given antibiotics if you have: °A severe infection. °A weak immune system. °If caused by narrow nasal passages or nasal polyps, you may need to have surgery. °Follow these instructions at home: °Medicines °Take, use, or apply over-the-counter and prescription medicines only as told by your health care provider. These may include nasal sprays. °If you were prescribed an antibiotic medicine, take it as told by your health care provider. Do not stop taking the antibiotic even if you start to feel better. °Hydrate and humidify ° °Drink enough fluid to keep your urine pale yellow. Staying hydrated will help to thin your mucus. °Use a cool mist humidifier to keep the humidity level in your home above 50%. °Inhale steam for 10-15 minutes, 3-4 times   a day, or as told by your health care provider. You can do this in the bathroom while a hot shower is running. °Limit your exposure to cool or dry air. °Rest °Rest as much as possible. °Sleep with your head raised (elevated). °Make sure you get enough sleep each night. °General instructions ° °Apply a warm, moist washcloth to your face 3-4 times a day or as told by your health care provider. This will help with discomfort. °Wash your hands often with soap and water to reduce your exposure to germs. If soap and water are not available, use hand sanitizer. °Do not smoke. Avoid being around people who are smoking (secondhand smoke). °Keep all  follow-up visits as told by your health care provider. This is important. °Contact a health care provider if: °You have a fever. °Your symptoms get worse. °Your symptoms do not improve within 10 days. °Get help right away if: °You have a severe headache. °You have persistent vomiting. °You have severe pain or swelling around your face or eyes. °You have vision problems. °You develop confusion. °Your neck is stiff. °You have trouble breathing. °Summary °Sinusitis is soreness and inflammation of your sinuses. Sinuses are hollow spaces in the bones around your face. °This condition is caused by nasal tissues that become inflamed or swollen. The swelling traps or blocks the flow of mucus. This allows bacteria, viruses, and fungi to grow, which leads to infection. °If you were prescribed an antibiotic medicine, take it as told by your health care provider. Do not stop taking the antibiotic even if you start to feel better. °Keep all follow-up visits as told by your health care provider. This is important. °This information is not intended to replace advice given to you by your health care provider. Make sure you discuss any questions you have with your health care provider. °Document Revised: 04/28/2018 Document Reviewed: 04/28/2018 °Elsevier Patient Education © 2022 [employer] ° °

## 2021-12-13 ENCOUNTER — Ambulatory Visit: Payer: No Typology Code available for payment source | Attending: Internal Medicine | Admitting: Physical Therapy

## 2021-12-13 ENCOUNTER — Other Ambulatory Visit: Payer: Self-pay

## 2021-12-13 ENCOUNTER — Ambulatory Visit (INDEPENDENT_AMBULATORY_CARE_PROVIDER_SITE_OTHER)
Admission: RE | Admit: 2021-12-13 | Discharge: 2021-12-13 | Disposition: A | Discharge status: Home / Self Care | Payer: No Typology Code available for payment source | Source: Ambulatory Visit | Attending: Cardiology | Admitting: Cardiology

## 2021-12-13 ENCOUNTER — Ambulatory Visit (INDEPENDENT_AMBULATORY_CARE_PROVIDER_SITE_OTHER): Payer: No Typology Code available for payment source | Admitting: Student

## 2021-12-13 ENCOUNTER — Encounter: Payer: Self-pay | Admitting: Student

## 2021-12-13 ENCOUNTER — Encounter: Payer: Self-pay | Admitting: Physical Therapy

## 2021-12-13 VITALS — BP 140/78 | Ht 70.0 in | Wt 238.0 lb

## 2021-12-13 DIAGNOSIS — M6281 Muscle weakness (generalized): Secondary | ICD-10-CM | POA: Diagnosis present

## 2021-12-13 DIAGNOSIS — Z4502 Encounter for adjustment and management of automatic implantable cardiac defibrillator: Secondary | ICD-10-CM

## 2021-12-13 DIAGNOSIS — I472 Ventricular tachycardia, unspecified: Secondary | ICD-10-CM

## 2021-12-13 DIAGNOSIS — I7121 Aneurysm of the ascending aorta, without rupture: Secondary | ICD-10-CM | POA: Diagnosis not present

## 2021-12-13 DIAGNOSIS — R0600 Dyspnea, unspecified: Secondary | ICD-10-CM | POA: Insufficient documentation

## 2021-12-13 DIAGNOSIS — R262 Difficulty in walking, not elsewhere classified: Secondary | ICD-10-CM | POA: Insufficient documentation

## 2021-12-13 DIAGNOSIS — I480 Paroxysmal atrial fibrillation: Secondary | ICD-10-CM

## 2021-12-13 DIAGNOSIS — I10 Essential (primary) hypertension: Secondary | ICD-10-CM

## 2021-12-13 DIAGNOSIS — I422 Other hypertrophic cardiomyopathy: Secondary | ICD-10-CM | POA: Diagnosis not present

## 2021-12-13 DIAGNOSIS — I712 Thoracic aortic aneurysm, without rupture, unspecified: Secondary | ICD-10-CM | POA: Diagnosis not present

## 2021-12-13 LAB — CUP PACEART INCLINIC DEVICE CHECK
Battery Remaining Longevity: 84 mo
Brady Statistic RA Percent Paced: 97 %
Brady Statistic RV Percent Paced: 45 %
Date Time Interrogation Session: 20230104125421
HighPow Impedance: 69.75 Ohm
Implantable Lead Implant Date: 20220829
Implantable Lead Implant Date: 20220829
Implantable Lead Location: 753859
Implantable Lead Location: 753860
Implantable Pulse Generator Implant Date: 20220829
Lead Channel Impedance Value: 437.5 Ohm
Lead Channel Impedance Value: 450 Ohm
Lead Channel Pacing Threshold Amplitude: 0.5 V
Lead Channel Pacing Threshold Amplitude: 0.5 V
Lead Channel Pacing Threshold Amplitude: 0.75 V
Lead Channel Pacing Threshold Amplitude: 0.75 V
Lead Channel Pacing Threshold Pulse Width: 0.5 ms
Lead Channel Pacing Threshold Pulse Width: 0.5 ms
Lead Channel Pacing Threshold Pulse Width: 0.5 ms
Lead Channel Pacing Threshold Pulse Width: 0.5 ms
Lead Channel Sensing Intrinsic Amplitude: 12 mV
Lead Channel Sensing Intrinsic Amplitude: 4.3 mV
Lead Channel Setting Pacing Amplitude: 2 V
Lead Channel Setting Pacing Amplitude: 2.5 V
Lead Channel Setting Pacing Pulse Width: 0.5 ms
Lead Channel Setting Sensing Sensitivity: 0.5 mV
Pulse Gen Serial Number: 810029878

## 2021-12-13 IMAGING — CT CT ANGIO CHEST
3 of 8 series · 18 of 46 positions shown · IV contrast (omnipaque)
Comparison: [DATE] CTA chest PE protocol, [DATE] TIA
protocol

CLINICAL DATA: Thoracic aneurysm follow-up

EXAM:
CT ANGIOGRAPHY CHEST WITH CONTRAST
TECHNIQUE: Multidetector CT imaging of the chest was performed using the
standard protocol during bolus administration of intravenous
contrast. Multiplanar CT image reconstructions and MIPs were
obtained to evaluate the vascular anatomy.
CONTRAST:  75mL OMNIPAQUE IOHEXOL 350 MG/ML SOLN

[Series 4: aorta 3.0 bf37 2 · axial · 0.83mm/px · z∈[-334,-46]mm · 13 of 112 slices shown]
[im 8/112  lung]
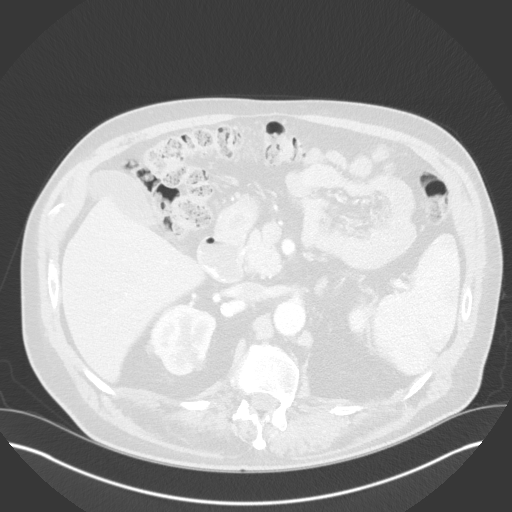
[im 16/112  soft-tissue]
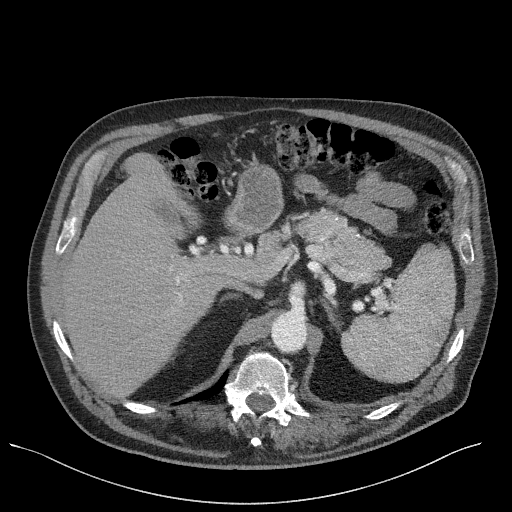
[im 24/112  lung]
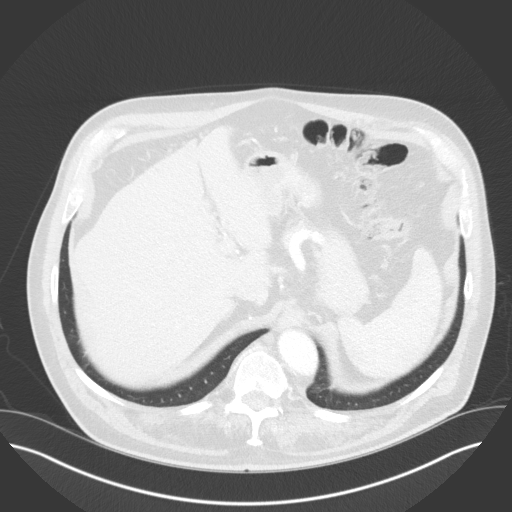
[im 32/112  soft-tissue]
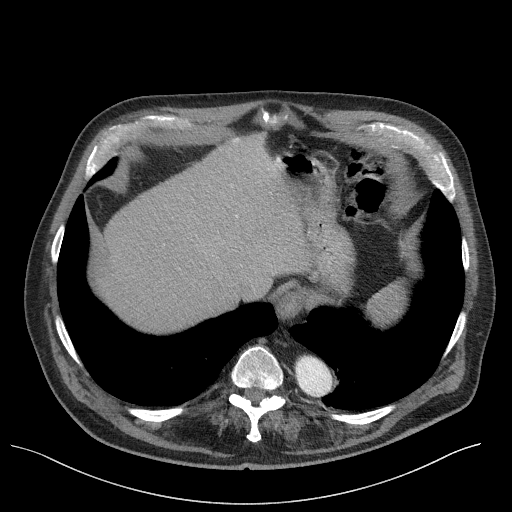
[im 40/112  lung]
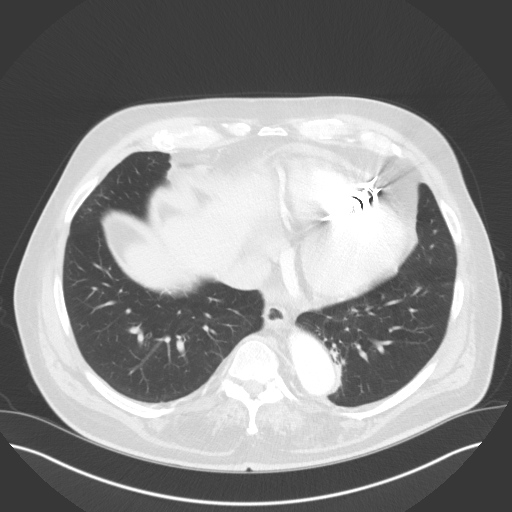
[im 48/112  soft-tissue]
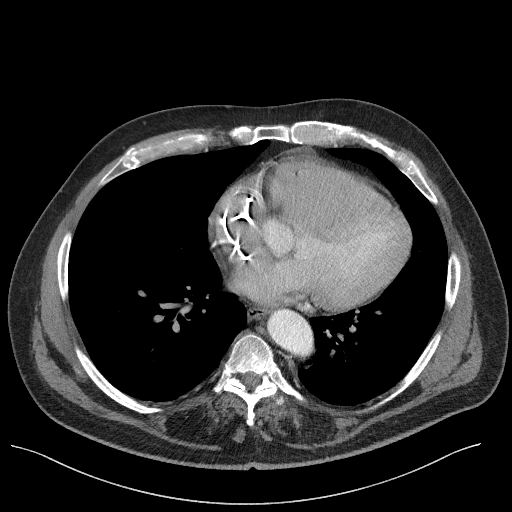
[im 56/112  lung]
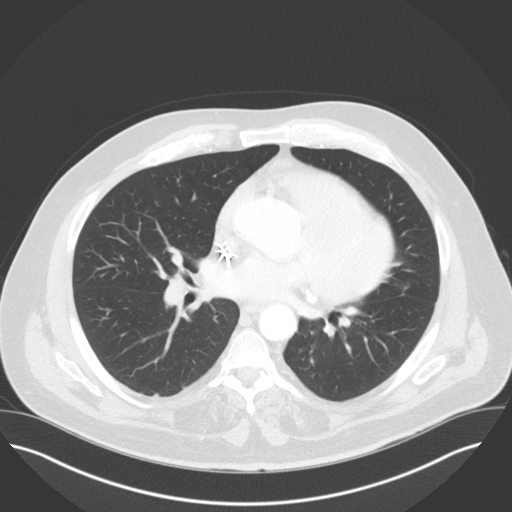
[im 64/112  soft-tissue]
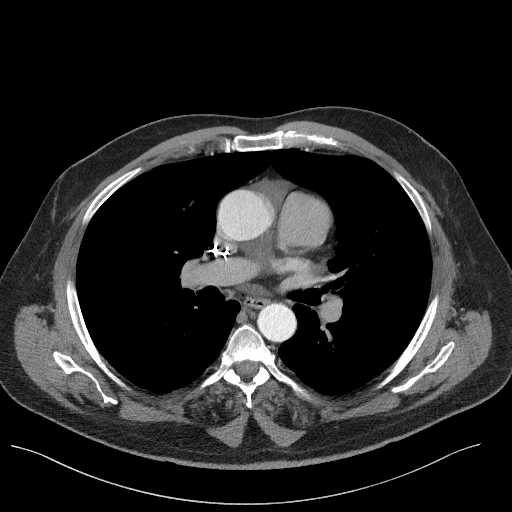
[im 72/112  lung]
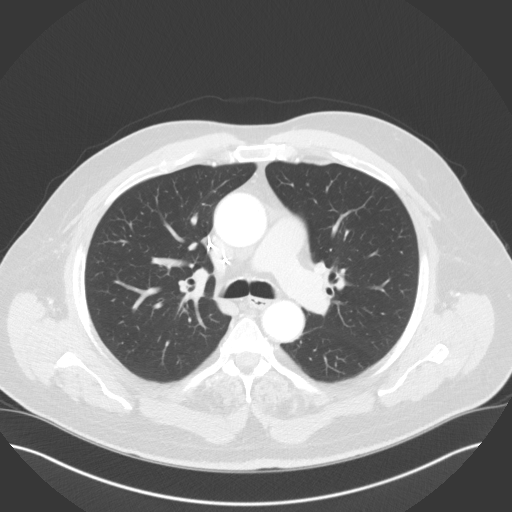
[im 80/112  soft-tissue]
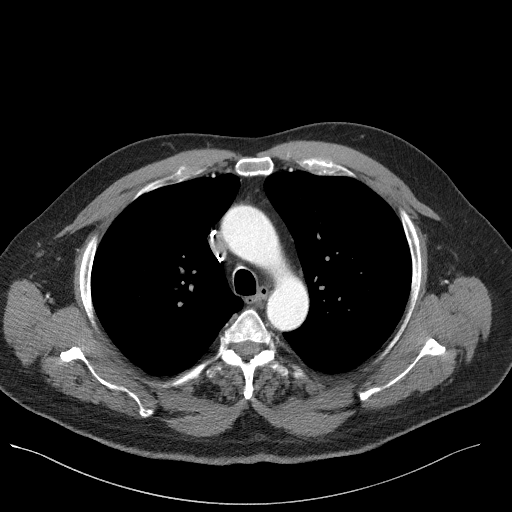
[im 88/112  lung]
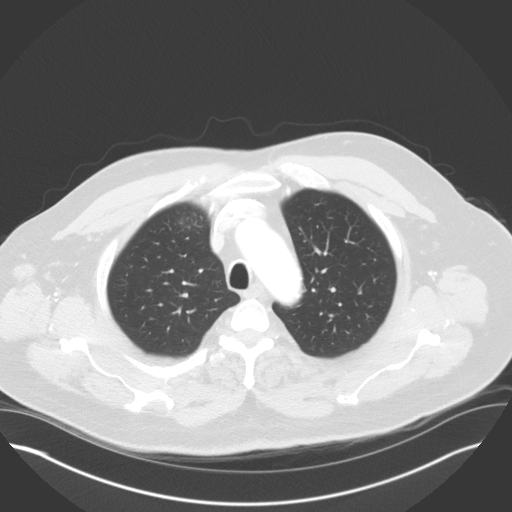
[im 96/112  soft-tissue]
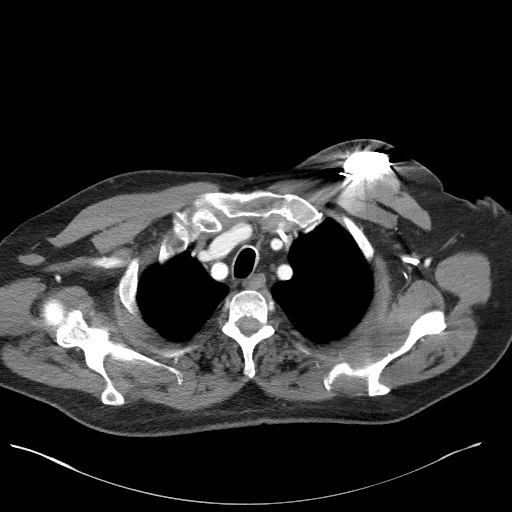
[im 104/112  lung]
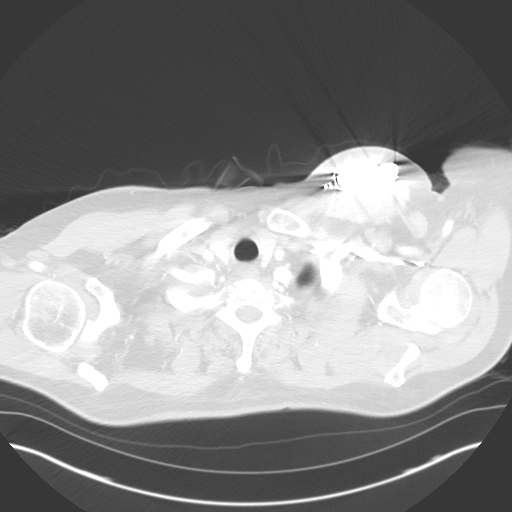

[Series 5: lung · axial · 0.83mm/px · z∈[-334,-286]mm · 2 of 112 slices shown]
[im 8/112  soft-tissue]
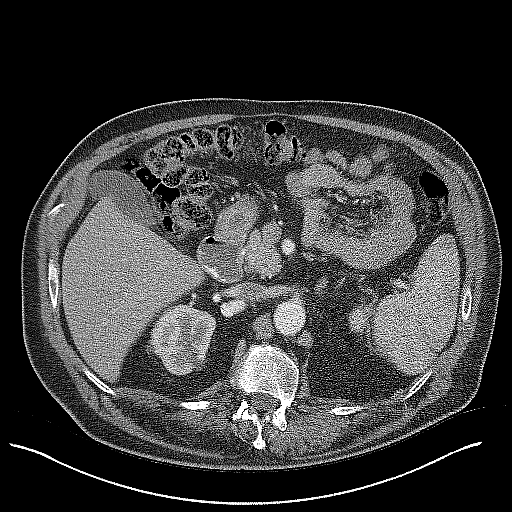
[im 24/112  soft-tissue]
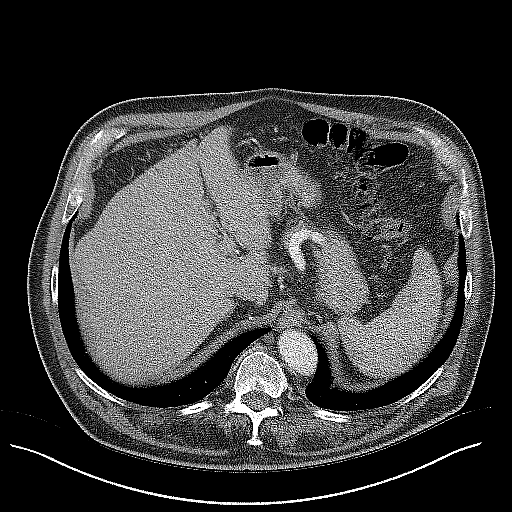

[Series 7: coronals · coronal · 0.68mm/px · 3 of 136 slices shown]
[im 34/136  soft-tissue]
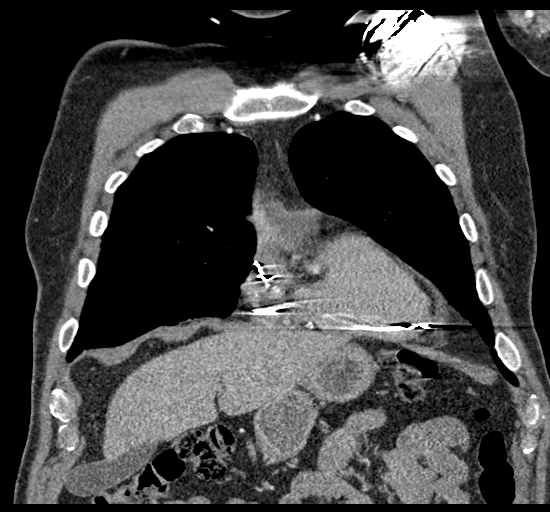
[im 68/136  soft-tissue]
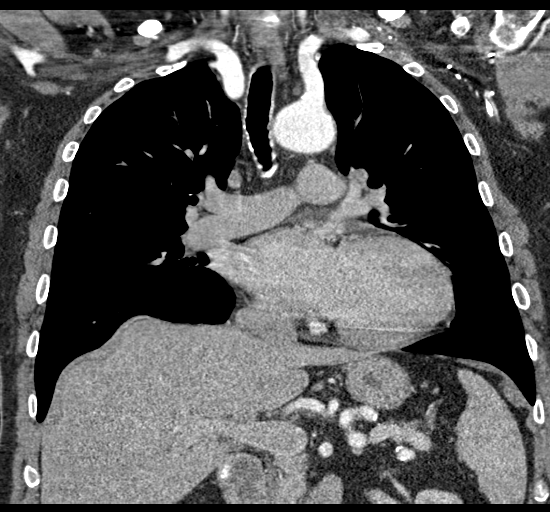
[im 102/136  soft-tissue]
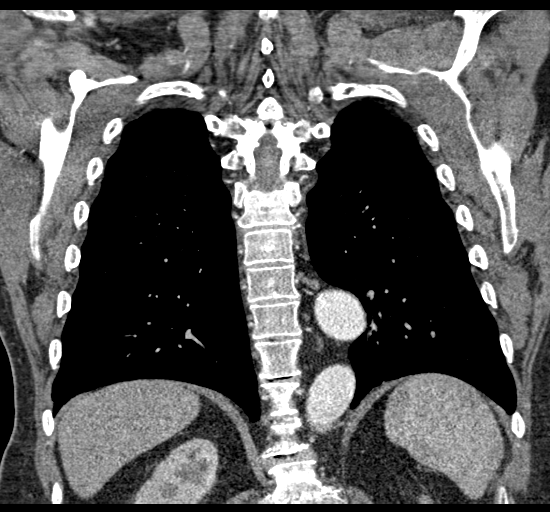

[18 of 46 positions shown; findings below may reference images not displayed]

FINDINGS: Cardiovascular: Redemonstrated aneurysmal ascending thoracic aorta
measuring up to 4.4 cm in diameter, unchanged, which tapers to
measure up to 3.6 cm in diameter at the arch and 3.2 cm in the
descending thoracic aorta. No evidence of dissection or para-aortic
stranding.

Redemonstrated cardiomegaly and small pericardial effusion. Although
the study is not timed for evaluation of pulmonary embolism, no
central pulmonary embolism is seen. Interval placement of left chest
cardiac device, with leads in the right atrium and ventricle.

Mediastinum/Nodes: No enlarged mediastinal, hilar, or axillary lymph
nodes. Thyroid gland, trachea, and esophagus demonstrate no
significant findings.

Lungs/Pleura: Lungs are clear. No pleural effusion or pneumothorax.

Upper Abdomen: Dilatation of the celiac trunk, measuring to 1.8 cm,
unchanged. Vascular calcifications. Small low-density lesions in the
kidneys, too small to characterize but likely renal cysts.

Musculoskeletal: No acute osseous abnormality. Redemonstrated
degenerative changes with mild wedging in the lower thoracic and
upper lumbar spine.

Review of the MIP images confirms the above findings.
IMPRESSION: 1. Overall unchanged ascending thoracic aortic aneurysm, measuring
approximately 4 4 cm. Recommend annual imaging followup by CTA or
MRA. This recommendation follows [Q8]
ACCF/AHA/AATS/ACR/ASA/SCA/YANETTE/YANETTE/YANETTE/YANETTE Guidelines for the
Diagnosis and Management of Patients with Thoracic Aortic Disease.
Circulation. [Q8]; 121: [id number]. Aortic aneurysm NOS ([Q8]-[Q8])
2. Stable dilatation of the celiac trunk, measuring approximately
1.8 cm.
3. Cardiomegaly with a small pericardial effusion.

## 2021-12-13 MED ORDER — IOHEXOL 350 MG/ML SOLN
75.0000 mL | Freq: Once | INTRAVENOUS | Status: AC | PRN
  Administered 2021-12-13: 75 mL via INTRAVENOUS

## 2021-12-13 NOTE — Therapy (Signed)
[facility] [place], [place], [address] Phone: [phone]   Fax:  [phone]  Physical Therapy Evaluation  Patient Details  Name: Ryan Glass MRN: [id number] Date of Birth: 04-10-1956 Referring Provider (PT): Sherren Mocha   Encounter Date: 12/13/2021   PT End of Session - 12/13/21 1911     Visit Number 1    Number of Visits 18    Date for PT Re-Evaluation 02/14/22    PT Start Time [id number]    PT Stop Time 1800    PT Time Calculation (min) 42 min    Activity Tolerance Patient limited by fatigue    Behavior During Therapy [facility] for tasks assessed/performed             Past Medical History:  Diagnosis Date   Ascending aortic aneurysm    a. CT 05/2021 showed 4.4x4.4cm TAA.   Bilateral lower extremity edema    Chronic gout without tophus    followed by dr Gerilyn Nestle    (06-08-2020  per pt last episode right knee 3 wks ago)   CKD (chronic kidney disease), stage II    nephrology--- Jen Mow PA (10-29-2019 note in epic scanned in  media)   Heart failure with mid-range ejection fraction [facility]) 05/27/2021   05/26/21: Left ventricular ejection fraction, by estimation, is 45 to 50%. There is severe asymmetric left ventricular hypertrophy. G1DD present.    History of COVID-19 06/07/2021   HOCM (hypertrophic obstructive cardiomyopathy) ([place])    s/p ICD 07/2021   Hypertension    followed by cardiology, dr t. turner   (05-14-2018 nuclear study in epic , normal perfusion with nuclear ef 61%)   Left hydrocele    Low serum potassium level 04/27/2014   OSA on CPAP    per pt uses every night   Palpitations followed by dr t. [name]   06-08-2020  still feels palipations due to PVCs when exertion but not with chest pain/ discomfort   Pneumonia due to COVID-19 virus    PVC's (premature ventricular contractions) cardiologist--- dr t. turner   Status post PVC ablation by [name] 2019 with recurrence of frequent  PVCs/bigeminy;  prior pseudobradycardia r/t pvcs   Rheumatoid arthritis involving multiple sites [facility])    rheumotology--- dr a. Gerilyn Nestle  ([facility] in [place])    Past Surgical History:  Procedure Laterality Date   BIOPSY  10/08/2021   Procedure: BIOPSY;  Surgeon: Daryel November, MD;  Location: [place];  Service: Gastroenterology;;   Kathleen Argue STUDY  08/18/2021   Procedure: BUBBLE STUDY;  Surgeon: Josue Hector, MD;  Location: [facility] ENDOSCOPY;  Service: Cardiovascular;;   ESOPHAGOGASTRODUODENOSCOPY N/A 10/08/2021   Procedure: ESOPHAGOGASTRODUODENOSCOPY (EGD);  Surgeon: Daryel November, MD;  Location: [facility];  Service: Gastroenterology;  Laterality: N/A;   HYDROCELE EXCISION Left 06/14/2020   Procedure: LEFT  HYDROCELECTOMY ADULT;  Surgeon: Robley Fries, MD;  Location: [facility];  Service: Urology;  Laterality: Left;   ICD IMPLANT N/A 08/07/2021   Procedure: ICD IMPLANT;  Surgeon: Evans Lance, MD;  Location: [place] CV LAB;  Service: Cardiovascular;  Laterality: N/A;   INCISIONAL HERNIA REPAIR  02-23-2016   @[facility]    LAPAROSCOPIC   LAPAROSCOPIC INGUINAL HERNIA REPAIR Bilateral 08-22-2015  @[facility]    AND UMBILICAL HERNIA REPAIR   PVC ABLATION N/A 10/07/2018   Procedure: PVC ABLATION;  Surgeon: Evans Lance, MD;  Location: [place] CV LAB;  Service: Cardiovascular;  Laterality: N/A;  RIGHT/LEFT HEART CATH AND CORONARY ANGIOGRAPHY N/A 05/29/2021   Procedure: RIGHT/LEFT HEART CATH AND CORONARY ANGIOGRAPHY;  Surgeon: Burnell Blanks, MD;  Location: [place] CV LAB;  Service: Cardiovascular;  Laterality: N/A;   TEE WITHOUT CARDIOVERSION N/A 08/18/2021   Procedure: TRANSESOPHAGEAL ECHOCARDIOGRAM (TEE);  Surgeon: Josue Hector, MD;  Location: [facility] ENDOSCOPY;  Service: Cardiovascular;  Laterality: N/A;   UMBILICAL HERNIA REPAIR  child    There were no vitals filed for this visit.    Subjective Assessment - 12/13/21 1721     Subjective Patient was  confused about why he was present. He thought he was receiving respiratory therapy for his breathing. Therapist educated him to the benefits of PT for strengthening, activity tolerance, which will help his breathing. He reports that he moves a lot at home. He has a 2 story house and is very SOB when he climbs the steps. he has arthritis, with painful.    Pertinent History RA, L ventricular hypertrophy, chronic desaturation, PVC with ICD implant 8/22,    Limitations Walking    How long can you sit comfortably? 30 minutes-develops L back pain.    How long can you stand comfortably? No breathing issues, but back pain again.    How long can you walk comfortably? If he walks fairly slowly, he can walk about 30 minutes.    Patient Stated Goals Decreased back pain, decreased SOB.    Currently in Pain? No/denies                [facility] PT Assessment - 12/13/21 0001       Assessment   Medical Diagnosis Dyspnea    Referring Provider (PT) Sherren Mocha      Balance Screen   Has the patient fallen in the past 6 months No      [place] residence    Living Arrangements Spouse/significant other    Type of [place] to enter    Entrance Stairs-Number of Steps 5    Entrance Stairs-Rails Cannot reach both    [place]    Additional Comments Has a mancave in the basement and bed and bath on top floor so he uses all 3 levels.      Prior Function   Level of Independence Independent    Vocation Full time employment    Vocation Requirements sits at desk and also walks around-[employer]    Leisure Rides a bike, but has not done in a long time.      Posture/Postural Control   Posture Comments Patient with flattened upper spinal curves, apparent thoracolumbar scoliosis iwth L lower rib hump, severe extension at thoracolumbar junction, with "pouch" of soft tissue on L just proximal to iliac crest.      ROM /  Strength   AROM / PROM / Strength AROM;Strength      AROM   Overall AROM Comments BUE and BLE AROM WFL.      Strength   Overall Strength Comments BUE strength grossly WFL. BLE MMT 4+/5, but demosntrate functional weakness with fatigue. Trunk with poor postural control.      Transfers   Five time sit to stand comments  19.08 sec, SATS 96, HR 80 BPM      Ambulation/Gait   Gait Comments Patient's SATS remained < 90% at rest, in spite of deep breathing. His SATS were higher during exercise than at rest.  6 Minute Walk- Baseline   6 Minute Walk- Baseline yes    HR (bpm) 87    02 Sat (%RA) 94 %      6 Minute walk- Post Test   6 Minute Walk Post Test yes    HR (bpm) 80    02 Sat (%RA) 94 %      6 minute walk test results    Aerobic Endurance Distance Walked 800    Endurance additional comments Patient had to stop after 5.15 minutes. His SATS and HR remained stable ah=nd higher during acitivty than at rest, Dropping down to 88%/ 80 BPM after 2 minutes of rest post test.                        Objective measurements completed on examination: See above findings.                PT Education - 12/13/21 1911     Education Details Patient educated to POC, therapist's concerns regarding resting SATS.    Person(s) Educated Patient    Methods Explanation    Comprehension Verbalized understanding              PT Short Term Goals - 12/13/21 1924       PT SHORT TERM GOAL #1   Title I with basic HEP    Time 4    Period Weeks    Status New    Target Date 01/10/22               PT Long Term Goals - 12/13/21 1924       PT LONG TERM GOAL #1   Title I with final HEP.    Time 9    Period Weeks    Status New    Target Date 02/14/22      PT LONG TERM GOAL #2   Title Patient will maintain SATS >89% at rest and during all activity.    Baseline <88% at rest    Time 9    Period Weeks    Status New    Target Date 02/14/22      PT LONG TERM  GOAL #3   Title Patient will walk at least 15 minutes without having to stop, maintianing SATS >90%    Baseline 5.15 minutes    Time 9    Period Weeks    Status New    Target Date 02/14/22      PT LONG TERM GOAL #4   Title Patient will perform all daily functional activities with back pain < 3/10.    Time 9    Period Weeks    Status New    Target Date 02/14/22                    Plan - 12/13/21 1912     Clinical Impression Statement Patient referred to PT after bout of pneumonia, with extensive cardiac history, including ablation, ICD, L ventricular hypertorphy, chronic desaturation. He arrives today very SOB, Resting STAS remained < 88% in spite of depp breathing techniques. SATS actually increased with activity, but returned to below 90% with rest. he demosntrates poor posture, very weak core, functionally weak BLE, very poor activity tolerance, C/O back pain. he will benefit from PT for strength, balance, gait and activity training with close monitoring of vitals. His low SATS have been reporte dto both Dr Excell Seltzer and Dr Francine Graven and will await any further instruction  from them as well.    Personal Factors and Comorbidities Fitness;Past/Current Experience;Comorbidity 2    Comorbidities L ventricular hypertrophy, chronic desaturation.    Examination-Activity Limitations Locomotion Level;Reach Overhead;Squat;Carry;Lift;Stairs;Stand    Examination-Participation Restrictions Cleaning;Occupation;Yard Work    [employer] Evolving/Moderate complexity    Clinical Decision Making Moderate    Rehab Potential Good    PT Frequency 2x / week    PT Duration Other (comment)   9w   PT Treatment/Interventions ADLs/Self Care Home Management;Electrical Stimulation;Neuromuscular re-education;Manual techniques;Energy conservation;Balance training;Therapeutic exercise;Therapeutic activities;Functional mobility training;Stair training;Gait training;Patient/family education     PT Next Visit Plan HEP    Consulted and Agree with Plan of Care Patient             Patient will benefit from skilled therapeutic intervention in order to improve the following deficits and impairments:  Difficulty walking, Cardiopulmonary status limiting activity, Decreased endurance, Decreased activity tolerance, Pain, Postural dysfunction, Decreased strength, Decreased mobility, Decreased balance, Improper body mechanics  Visit Diagnosis: Difficulty in walking, not elsewhere classified  Muscle weakness (generalized)     Problem List Patient Active Problem List   Diagnosis Date Noted   CAP (community acquired pneumonia) 10/13/2021   Dyspnea    Gastrointestinal hemorrhage    Acute respiratory failure with hypoxia ([place]) 10/07/2021   ABLA (acute blood loss anemia) 10/07/2021   History of pulmonary embolus (PE) 10/07/2021   AF (paroxysmal atrial fibrillation) ([place]) 10/07/2021   VT (ventricular tachycardia) 09/08/2021   Hypertrophic cardiomyopathy ([place]) 08/15/2021   Ventricular tachycardia 08/02/2021   Acute on chronic respiratory failure ([place]) 06/07/2021   History of COVID-19 06/07/2021   Rheumatoid arthritis involving multiple sites on Humira ([place]) 06/07/2021   On chronic prednisone therapy for Rheumatoid Arthritis 06/07/2021   CAD (coronary artery disease), non-obstructive 05/30/2021   Elevated troponin    Precordial chest pain    Heart failure with mid-range ejection fraction ([place]) 05/27/2021   LVH (left ventricular hypertrophy) due to hypertensive disease, with heart failure ([place]) 05/27/2021   Class 2 severe obesity with serious comorbidity and body mass index (BMI) of 35.0 to 35.9 in adult [facility]) 05/26/2021   Pulmonary embolism ([place]) 05/25/2021   Dyspnea on exertion    Thoracic aortic aneurysm without rupture 02/01/2021   Ascending aortic aneurysm 02/01/2021   Other hyperlipidemia 10/14/2020   Testicular swelling 05/14/2019   Pain in right knee 01/29/2019   PVC's  (premature ventricular contractions)    PVC (premature ventricular contraction) 10/07/2018   Dilated aortic root ([facility])    Allergic rhinitis 01/10/2016   Chronic gout without tophus 01/10/2016   Drug therapy 01/10/2016   OSA (obstructive sleep apnea) 01/10/2016   Colonic polyp 12/14/2015   Rt groin pain 12/14/2015   Scoliosis of thoracolumbar spine 12/14/2015   Right lower quadrant abdominal pain 04/20/2015   Arthritis of knee 02/22/2015   Edema of extremities 02/22/2015   Essential hypertension 02/22/2015   Preventative health care 02/22/2015   Bradycardia by electrocardiogram 04/16/2014    Marcelina Morel, DPT 12/13/2021, 7:31 PM  [place]. [place], [place], [address] Phone: [phone]   Fax:  [phone]  Name: Ryan Glass MRN: [id number] Date of Birth: Nov 16, 1956

## 2021-12-13 NOTE — Progress Notes (Signed)
Electrophysiology Office Note Date: 12/13/2021  ID:  Ryan, Glass Jun 03, 1956, MRN [id number]  PCP: [name], MD Primary Cardiologist: [name], MD Electrophysiologist: [name], MD   CC: Routine ICD follow-up  Ryan Glass is a 66 y.o. male seen today for [name], MD for acute visit due to reports of edema around his ICD .  Since last being seen in our clinic the patient reports doing swelling and irritation in his left neck/swollen left anterior cervical lymph nodes.  Pt first noticed when it was tender to put his seatbelt over the area.    Device History: St. Jude Dual Chamber ICD implanted 08/07/21 for VT History of appropriate therapy: Yes History of AAD therapy: Yes; currently on sotalol    Past Medical History:  Diagnosis Date   Ascending aortic aneurysm    a. CT 05/2021 showed 4.4x4.4cm TAA.   Bilateral lower extremity edema    Chronic gout without tophus    followed by dr Gerilyn Nestle    (06-08-2020  per pt last episode right knee 3 wks ago)   CKD (chronic kidney disease), stage II    nephrology--- Jen Mow PA (10-29-2019 note in epic scanned in  media)   Heart failure with mid-range ejection fraction [facility]) 05/27/2021   05/26/21: Left ventricular ejection fraction, by estimation, is 45 to 50%. There is severe asymmetric left ventricular hypertrophy. G1DD present.    History of COVID-19 06/07/2021   HOCM (hypertrophic obstructive cardiomyopathy) ([place])    s/p ICD 07/2021   Hypertension    followed by cardiology, dr t. turner   (05-14-2018 nuclear study in epic , normal perfusion with nuclear ef 61%)   Left hydrocele    Low serum potassium level 04/27/2014   OSA on CPAP    per pt uses every night   Palpitations followed by dr t. [name]   06-08-2020  still feels palipations due to PVCs when exertion but not with chest pain/ discomfort   Pneumonia due to COVID-19 virus    PVC's (premature ventricular contractions) cardiologist--- dr t.  turner   Status post PVC ablation by [name] 2019 with recurrence of frequent PVCs/bigeminy;  prior pseudobradycardia r/t pvcs   Rheumatoid arthritis involving multiple sites [facility])    rheumotology--- dr a. Gerilyn Nestle  ([facility] in [place])   Past Surgical History:  Procedure Laterality Date   BIOPSY  10/08/2021   Procedure: BIOPSY;  Surgeon: Daryel November, MD;  Location: [place];  Service: Gastroenterology;;   Kathleen Argue STUDY  08/18/2021   Procedure: BUBBLE STUDY;  Surgeon: Josue Hector, MD;  Location: [facility] ENDOSCOPY;  Service: Cardiovascular;;   ESOPHAGOGASTRODUODENOSCOPY N/A 10/08/2021   Procedure: ESOPHAGOGASTRODUODENOSCOPY (EGD);  Surgeon: Daryel November, MD;  Location: [place];  Service: Gastroenterology;  Laterality: N/A;   HYDROCELE EXCISION Left 06/14/2020   Procedure: LEFT  HYDROCELECTOMY ADULT;  Surgeon: Robley Fries, MD;  Location: [facility];  Service: Urology;  Laterality: Left;   ICD IMPLANT N/A 08/07/2021   Procedure: ICD IMPLANT;  Surgeon: Evans Lance, MD;  Location: [place] CV LAB;  Service: Cardiovascular;  Laterality: N/A;   INCISIONAL HERNIA REPAIR  02-23-2016   @[facility]    LAPAROSCOPIC   LAPAROSCOPIC INGUINAL HERNIA REPAIR Bilateral 08-22-2015  @[facility]    AND UMBILICAL HERNIA REPAIR   PVC ABLATION N/A 10/07/2018   Procedure: PVC ABLATION;  Surgeon: Evans Lance, MD;  Location: [place] CV LAB;  Service: Cardiovascular;  Laterality: N/A;   RIGHT/LEFT  HEART CATH AND CORONARY ANGIOGRAPHY N/A 05/29/2021   Procedure: RIGHT/LEFT HEART CATH AND CORONARY ANGIOGRAPHY;  Surgeon: Burnell Blanks, MD;  Location: [place] CV LAB;  Service: Cardiovascular;  Laterality: N/A;   TEE WITHOUT CARDIOVERSION N/A 08/18/2021   Procedure: TRANSESOPHAGEAL ECHOCARDIOGRAM (TEE);  Surgeon: Josue Hector, MD;  Location: [place];  Service: Cardiovascular;  Laterality: N/A;   UMBILICAL HERNIA REPAIR  child    Current Outpatient Medications   Medication Sig Dispense Refill   Abatacept (ORENCIA CLICKJECT) 0000000 MG/ML SOAJ Inject into the skin once a week.     Acetaminophen-Codeine 300-30 MG tablet Take 1-2 tablets by mouth every 12 (twelve) hours as needed for pain.     albuterol (VENTOLIN HFA) 108 (90 Base) MCG/ACT inhaler Inhale 2 puffs into the lungs every 4 (four) hours as needed for wheezing or shortness of breath. 8 g 2   amoxicillin-clavulanate (AUGMENTIN) 875-125 MG tablet Take 1 tablet by mouth every 12 (twelve) hours for 10 days. 20 tablet 0   apixaban (ELIQUIS) 5 MG TABS tablet Take 1 tablet (5 mg total) by mouth 2 (two) times daily. 60 tablet 5   atorvastatin (LIPITOR) 40 MG tablet Take 1 tablet (40 mg total) by mouth daily. 90 tablet 3   carvedilol (COREG) 6.25 MG tablet Take 6.25 mg by mouth 2 (two) times daily.     colchicine 0.6 MG tablet Take 0.6-1.2 mg by mouth daily as needed (for gout flares).     diltiazem (CARDIZEM CD) 180 MG 24 hr capsule Take 180 mg by mouth daily.     fluticasone (FLONASE) 50 MCG/ACT nasal spray Place 1 spray into both nostrils daily.     fluticasone-salmeterol (ADVAIR) 100-50 MCG/ACT AEPB INHALE ONE PUFF BY MOUTH TWICE A DAY 60 each 0   folic acid (FOLVITE) 1 MG tablet Take 1 mg by mouth daily.     furosemide (LASIX) 40 MG tablet Take 1 tablet (40 mg total) by mouth daily as needed (weight gain). Take 1 tablet by mouth daily for 3 days, then decrease to only as needed for increase weight gain 30 tablet 3   gabapentin (NEURONTIN) 600 MG tablet Take 600 mg by mouth with breakfast, with lunch, and with evening meal.     hydrALAZINE (APRESOLINE) 100 MG tablet Take 1 tablet (100 mg total) by mouth every 8 (eight) hours. 90 tablet 6   loratadine (CLARITIN) 10 MG tablet Take 1 tablet (10 mg total) by mouth daily for 7 days. 30 tablet 11   Magnesium Oxide 400 MG CAPS Take 1 capsule (400 mg total) by mouth daily. 90 capsule 3   metoprolol tartrate (LOPRESSOR) 50 MG tablet Take 1 tablet (50 mg total) by  mouth 2 (two) times daily. 60 tablet 1   montelukast (SINGULAIR) 10 MG tablet Take 1 tablet (10 mg total) by mouth at bedtime. 90 tablet 3   pantoprazole (PROTONIX) 40 MG tablet Take 1 tablet (40 mg total) by mouth 2 (two) times daily. 60 tablet 1   potassium chloride SA (KLOR-CON) 20 MEQ tablet Take 2 tablets (40 mEq total) by mouth 2 (two) times daily. 120 tablet 6   predniSONE (DELTASONE) 10 MG tablet Take 2 tablets (20 mg total) by mouth daily with breakfast for 3 days, THEN 1 tablet (10 mg total) daily with breakfast for 3 days, THEN 0.5 tablets (5 mg total) daily with breakfast for 3 days. 21 tablet 0   predniSONE (DELTASONE) 5 MG tablet Take 5 mg by mouth daily with  breakfast.     promethazine-dextromethorphan (PROMETHAZINE-DM) 6.25-15 MG/5ML syrup Take 5 mLs by mouth 4 (four) times daily as needed for cough. 118 mL 0   sildenafil (REVATIO) 20 MG tablet Take 20 mg by mouth daily as needed (erectile dysfunction).     sotalol (BETAPACE) 120 MG tablet Take 1 tablet (120 mg total) by mouth every 12 (twelve) hours. 60 tablet 6   spironolactone (ALDACTONE) 25 MG tablet Take 1 tablet (25 mg total) by mouth daily. 30 tablet 6   verapamil (CALAN-SR) 180 MG CR tablet Take 180 mg by mouth daily.     No current facility-administered medications for this visit.    Allergies:   Amlodipine besylate and Aspirin   Social History: Social History   Socioeconomic History   Marital status: Married    Spouse name: Not on file   Number of children: Not on file   Years of education: Not on file   Highest education level: Not on file  Occupational History   Not on file  Tobacco Use   Smoking status: Never   Smokeless tobacco: Never  Vaping Use   Vaping Use: Never used  Substance and Sexual Activity   Alcohol use: Not Currently    Alcohol/week: 5.0 - 7.0 standard drinks    Types: 5 - 7 Cans of beer per week    Comment: Stop drinking alcohol 3 months ago   Drug use: Never   Sexual activity: Not on  file  Other Topics Concern   Not on file  Social History Narrative   Not on file   Social Determinants of Health   Financial Resource Strain: Not on file  Food Insecurity: Not on file  Transportation Needs: Not on file  Physical Activity: Not on file  Stress: Not on file  Social Connections: Not on file  Intimate Partner Violence: Not on file    Family History: Family History  Problem Relation Age of Onset   Cardiomyopathy Mother    Heart attack Father     Review of Systems: All other systems reviewed and are otherwise negative except as noted above.   Physical Exam: There were no vitals filed for this visit.   GEN- The patient is well appearing, alert and oriented x 3 today.   HEENT: normocephalic, atraumatic; sclera clear, conjunctiva pink; hearing intact; oropharynx clear; neck supple, no JVP Lymph- no cervical lymphadenopathy Lungs- Clear to ausculation bilaterally, normal work of breathing.  No wheezes, rales, rhonchi Heart- Regular rate and rhythm, no murmurs, rubs or gallops, PMI not laterally displaced GI- soft, non-tender, non-distended, bowel sounds present, no hepatosplenomegaly Extremities- no clubbing or cyanosis. No edema; DP/PT/radial pulses 2+ bilaterally MS- no significant deformity or atrophy Skin- warm and dry, no rash or lesion; ICD pocket well healed Psych- euthymic mood, full affect Neuro- strength and sensation are intact  ICD interrogation- reviewed in detail today,  See PACEART report  EKG:  EKG is not ordered today.  Recent Labs: 06/06/2021: TSH 0.975 10/07/2021: ALT 26; B Natriuretic Peptide 33.9 10/12/2021: Hemoglobin 9.2; Magnesium 2.0; Platelets 146 11/29/2021: BUN 35; Creatinine, Ser 1.78; Potassium 3.9; Sodium 138 12/05/2021: NT-Pro BNP 183   Wt Readings from Last 3 Encounters:  11/21/21 245 lb (111.1 kg)  11/15/21 240 lb 12.8 oz (109.2 kg)  11/13/21 245 lb 3.2 oz (111.2 kg)     Other studies Reviewed: Additional studies/  records that were reviewed today include: Previous EP office notes.   Assessment and Plan:  ICD site Examined by  myself and Dr. Curt Bears. No irritation or edema surrounding the site. No inflammatory changes, and skin moves freely over the top of the device. Not tender to touch.  Pt IS tender to the touch along his left collar bone and left neck, out of proportion to exam, but still mild.  On Augmentin + Prednisone per His occupational clinic ? For ? COPD exacerbation +/- eustachian tube dysfunction  2.  VT s/p St. Jude dual chamber ICD  euvolemic today Stable on an appropriate medical regimen Normal ICD function See Pace Art report No changes today  3. HTN Stable on current regimen    4. HCM with asymmetric septal hypertrophy Have recommended outpatient consultation with Dr. Broadus John   5. PAF On eliqius for CHA2DS2/VASc of at least 3   Current medicines are reviewed at length with the patient today.    There do not appear to be any acute issues with his site pocket proper. Cannot explain neck and shoulder superficial tenderness.  Dr. Curt Bears reviewed site as well and agreed no acute issue.  Disposition:   Follow up with  EP-APP or Device clinic in 3-4 weeks on a day [name] is also in the office.      Jacalyn Lefevre, PA-C  12/13/2021 12:30 PM  [address] [place] [phone] (office) [phone] (fax)

## 2021-12-13 NOTE — Patient Instructions (Signed)
Medication Instructions:  Your physician recommends that you continue on your current medications as directed. Please refer to the Current Medication list given to you today.  *If you need a refill on your cardiac medications before your next appointment, please call your pharmacy*   Lab Work: None  If you have labs (blood work) drawn today and your tests are completely normal, you will receive your results only by: MyChart Message (if you have MyChart) OR A paper copy in the mail If you have any lab test that is abnormal or we need to change your treatment, we will call you to review the results.   Follow-Up: At [facility], you and your health needs are our priority.  As part of our continuing mission to provide you with exceptional heart care, we have created designated Provider Care Teams.  These Care Teams include your primary Cardiologist (physician) and Advanced Practice Providers (APPs -  Physician Assistants and Nurse Practitioners) who all work together to provide you with the care you need, when you need it.  

## 2021-12-14 ENCOUNTER — Telehealth: Payer: Self-pay | Admitting: Pulmonary Disease

## 2021-12-14 ENCOUNTER — Inpatient Hospital Stay: Admission: RE | Admit: 2021-12-14 | Payer: No Typology Code available for payment source | Source: Ambulatory Visit

## 2021-12-14 ENCOUNTER — Encounter: Payer: Self-pay | Admitting: Physical Therapy

## 2021-12-14 NOTE — Therapy (Signed)
[facility] [address] [place], [place], [address] Phone: [phone]   Fax:  [phone]  Patient Details  Name: [name] MRN: [id number] Date of Birth: 07/07/1956 Referring Provider:  No ref. provider found  Encounter Date: 12/14/2021  Phone call placed to Dr Francine Graven to inform the Pulmonologist of patient's resting SATS during PT evaluation. Resting SATS remained 83-88%, even with focused deep breathing in attempts to elevate them. SATS actually rose during activity, remaining 93-95%, but then returned to below 90% after rest. Iona Beard, PT 12/14/2021, 9:20 AM  [facility] [address] [place], [place], [address] Phone: [phone]   Fax:  [phone]

## 2021-12-14 NOTE — Telephone Encounter (Signed)
Called patient but he did not answer. Left message for him to call us back.  

## 2021-12-15 ENCOUNTER — Encounter: Payer: Self-pay | Admitting: Registered Nurse

## 2021-12-15 ENCOUNTER — Telehealth: Payer: Self-pay | Admitting: Registered Nurse

## 2021-12-15 DIAGNOSIS — J4521 Mild intermittent asthma with (acute) exacerbation: Secondary | ICD-10-CM

## 2021-12-15 NOTE — Telephone Encounter (Signed)
Patient seen in clinic 12/14/2021 see tcon dated 12/15/2021 symptoms improving.  Encounter closed.

## 2021-12-15 NOTE — Telephone Encounter (Signed)
Patient to clinic for refill metoprolol tartrate 50mg  po BID #180 from [facility].  Patient swelling and pain decreased left anterior neck and left upper anterior chest.  Saw cardiology, PT and had CT scan yesterday.  Patient reported provider stated not an infection around his AICD.  SP02 improved today 94-95% RA; not short of breath after walking across warehouse.  Able to see skin crease left lateral neck today but still not symmetrical with right side.  Nonpitting edema 0-1+/4.  AICD outline visible today crisp edges and much improved edema almost completely back to baseline of no swelling 0-1+/4 today.  No palpable lymphadenopathy anterior cervical.  Bilateral TMs air fluid level clear; bilateral allergic shiners persist along with lower eyelid swelling 1-2+/4.  No cough/throat clearing/nasal congestion audible in clinic x 5 minute conversation.  Reviewed cardiology epic note PA [place].  CT scan results still not available transcription.  PT rehab provider noted patient had decreased sp02 after exertion but not during exertion and sent message to patient pulmonologist Dr Francine Graven.  Patient reported still taking additional 20mg  prednisone and using albuterol inhaler on schedule along with his fluticasone salmeterol 100-76mcg po BID.  Day 8 of augmentin 875mg  today.  BBS CTA and scant lower extremity edema today.  Home weight check stable.  Patient to follow up for re-evaluation if new or worsening symptoms with tapering down of prednisone to 10mg  and completion of augmentin 10 day course.  Patient A&Ox3 spoke full sentences without difficulty skin warm dry and pink gait sure and steady clinic.  In/out of chair without difficulty.   Patient verbalized understanding information/instructions, agreed with plan of care and had no further questions at this time.

## 2021-12-17 ENCOUNTER — Encounter: Payer: Self-pay | Admitting: Cardiology

## 2021-12-17 DIAGNOSIS — I728 Aneurysm of other specified arteries: Secondary | ICD-10-CM | POA: Insufficient documentation

## 2021-12-18 ENCOUNTER — Other Ambulatory Visit: Payer: Self-pay

## 2021-12-18 ENCOUNTER — Other Ambulatory Visit: Payer: Self-pay | Admitting: *Deleted

## 2021-12-18 DIAGNOSIS — R232 Flushing: Secondary | ICD-10-CM

## 2021-12-18 DIAGNOSIS — M898X6 Other specified disorders of bone, lower leg: Secondary | ICD-10-CM

## 2021-12-18 DIAGNOSIS — E538 Deficiency of other specified B group vitamins: Secondary | ICD-10-CM

## 2021-12-18 LAB — ALPHA GALACTOSIDASE: Alpha-Galactosidase activity: 65.9 nmol/hr/mg prt (ref >35.5)

## 2021-12-18 LAB — PRO B NATRIURETIC PEPTIDE: NT-Pro BNP: 183 pg/mL (ref 0–376)

## 2021-12-19 ENCOUNTER — Encounter: Payer: Self-pay | Admitting: Registered Nurse

## 2021-12-19 ENCOUNTER — Ambulatory Visit: Payer: Self-pay | Admitting: Registered Nurse

## 2021-12-19 VITALS — BP 120/86 | HR 76 | Temp 97.2°F | Resp 18

## 2021-12-19 DIAGNOSIS — R222 Localized swelling, mass and lump, trunk: Secondary | ICD-10-CM

## 2021-12-19 DIAGNOSIS — R7989 Other specified abnormal findings of blood chemistry: Secondary | ICD-10-CM

## 2021-12-19 DIAGNOSIS — R5383 Other fatigue: Secondary | ICD-10-CM

## 2021-12-19 DIAGNOSIS — J4521 Mild intermittent asthma with (acute) exacerbation: Secondary | ICD-10-CM

## 2021-12-19 LAB — BASIC METABOLIC PANEL
BUN/Creatinine Ratio: 24 (ref 10–24)
BUN: 33 mg/dL — ABNORMAL HIGH (ref 8–27)
CO2: 16 mmol/L — ABNORMAL LOW (ref 20–29)
Calcium: 9.7 mg/dL (ref 8.6–10.2)
Chloride: 108 mmol/L — ABNORMAL HIGH (ref 96–106)
Creatinine, Ser: 1.4 mg/dL — ABNORMAL HIGH (ref 0.76–1.27)
Glucose: 95 mg/dL (ref 70–99)
Potassium: 4.7 mmol/L (ref 3.5–5.2)
Sodium: 139 mmol/L (ref 134–144)
eGFR: 56 mL/min/{1.73_m2} — ABNORMAL LOW (ref >59)

## 2021-12-19 LAB — VITAMIN D 25 HYDROXY (VIT D DEFICIENCY, FRACTURES): Vit D, 25-Hydroxy: 24.4 ng/mL — ABNORMAL LOW (ref 30.0–100.0)

## 2021-12-19 LAB — VITAMIN B12: Vitamin B-12: 343 pg/mL (ref 232–1245)

## 2021-12-19 LAB — MAGNESIUM: Magnesium: 2.4 mg/dL — ABNORMAL HIGH (ref 1.6–2.3)

## 2021-12-19 MED ORDER — DOXYCYCLINE HYCLATE 100 MG PO TABS: 100.0000 mg | ORAL_TABLET | Freq: Two times a day (BID) | ORAL | 0 refills | Status: AC

## 2021-12-19 MED ORDER — MAGNESIUM OXIDE 400 MG PO CAPS: 400.0000 mg | ORAL_CAPSULE | ORAL | 3 refills | Status: DC

## 2021-12-19 MED ORDER — TRELEGY ELLIPTA 100-62.5-25 MCG/ACT IN AEPB: 1.0000 | INHALATION_SPRAY | Freq: Every day | RESPIRATORY_TRACT | 0 refills | Status: AC

## 2021-12-19 NOTE — Patient Instructions (Addendum)
Start doxycycline 159m every 12 hours if coughing up cloudy tan/brown/green mucous or if red streaks on chest around AICD and call cardiology if red streaks occur  Pick up new inhaler and start 1 puff daily; stop advair (fluticasone-salmeterol)  Continue albuterol 2 puffs every 4 hours as needed for shortness of breath/wheezing  Decrease magnesium 4063mto every other day since you started multivitamin with 10063magnesium every day your level slightly elevated with daily dosing  Recheck vitamin D level in 3 months since you just start 400IU daily with multivitamin and level below 30 consider adding 1000 units over the counter supplement Vitamin D 3  Asthma Attack Asthma attack, also called acute bronchospasm, is the sudden narrowing and tightening of the air passages, which limits the amount of oxygen that can get into the lungs. The narrowing is caused by inflammation and tightening of the muscles in the air tubes (bronchi) in the lungs. Too much mucus is also produced, which narrows the airways more. This can cause trouble breathing, loud breathing (wheezing), and coughing. The goal of treatment is to open the airways in the lungs and reduce inflammation. What are the causes? Possible causes or triggers of this condition include: Animal dander, dust mites, or cockroaches. Mold, pollen from trees or grass, or cold air. Air pollutants such as dust, household cleaners, aerosol sprays, strong chemicals, strong odors, and smoke of any kind. Stress or strong emotions such as crying or laughing hard. Exercise or activity that requires a lot of energy. Substances in foods and drinks, such as dried fruits and wine, called sulfites. Certain medicines or medical conditions such as: Aspirin or beta-blockers. Infections or inflammatory conditions, such as a flu (influenza), a cold, pneumonia, or inflammation of the nasal membranes (rhinitis). Gastroesophageal reflux disease (GERD). GERD is a condition  in which stomach acid backs up into your esophagus and spills into your trachea (windpipe), which can irritate your airways. What are the signs or symptoms? Symptoms of this condition include: Wheezing. This may sound like whistling while breathing. This may only happen at night. Excessive coughing. This may only happen at night. Chest tightness or pain. Shortness of breath. Feeling like you cannot get enough air no matter how hard you breathe (air hunger). How is this diagnosed? This condition may be diagnosed based on: Your medical history. Your symptoms. A physical exam. Tests to check for other causes of your symptoms or other conditions that may have triggered your asthma attack. These tests may include: A chest X-ray. Blood tests. Tests to assess lung function, such as breathing into a device that measures how much air you can inhale and exhale (spirometry). How is this treated? Treatment for this condition depends on the severity and cause of your asthma attack. For mild attacks, you may receive medicines through a hand-held inhaler (metered dose inhaler, or MDI) or through a device that turns liquid medicine into a mist (nebulizer). These medicines include: Quick relief or rescue medicines that quickly relax the airways and lungs. Long-acting medicines that are used daily to prevent (control) your asthma symptoms. For moderate or severe attacks, you may be treated with steroid medicines by mouth or through an IV injection at the hospital. For severe attacks, you may need oxygen therapy or a breathing machine (ventilator). If your asthma attack was caused by an infection from bacteria, you will be given antibiotic medicines. Follow these instructions at home: Medicines Take over-the-counter and prescription medicines only as told by your health care provider. Keep your  medicines up-to-date. Make sure you have all of your medicines available at all times. If you were prescribed an  antibiotic medicine, take it as told by your health care provider. Do not stop taking the antibiotic even if you start to feel better. Tell your doctor if you may be pregnant to make sure your asthma medicine is safe to use during pregnancy. Avoiding triggers  Keep track of things that trigger your asthma attacks. Avoid exposure to these triggers. Do not use any products that contain nicotine or tobacco, such as cigarettes, e-cigarettes, and chewing tobacco. If you need help quitting, ask your health care provider. When there is a lot of pollen, air pollution, or humidity, keep windows closed and use an air conditioner or go to places with air conditioning. Asthma action plan Work with your health care provider to make a written plan for managing and treating your asthma attacks (asthma action plan). This plan should include: A list of your asthma triggers and how to avoid them. A list of symptoms that you may have during an asthma attack. Information about which medicine to take, when to take the medicine and how much of the medicine to take. Information to help you understand your peak flow measurements. Daily actions that you can take to control your asthma symptoms. Contact information for your health care providers. If you have an asthma attack, act quickly. Follow the emergency steps on your written asthma action plan. This may prevent you from needing to go to the hospital. General instructions Avoid excessive exercise or activity until your asthma attack goes away. Ask your health care provider what activities are safe for you and when you can return to your normal activities. Stay up to date on all your vaccines, such as flu and pneumonia vaccines. Drink enough fluid to keep your urine pale yellow. Staying hydrated helps keep mucus in your lungs thin so it can be coughed up easily. Do not use alcohol until you have recovered. Keep all follow-up visits as told by your health care  provider. This is important. Asthma requires careful medical care. Contact a health care provider if: You have followed your action plan for 1 hour and your peak flow reading is still at 50-79%. This is in the yellow zone, which means "caution." You need to use your quick reliever medicine more frequently than normal. Your medicines are causing side effects, such as rash, itching, swelling, or trouble breathing. Your symptoms do not improve after 48 hours. You cough up mucus that is thicker than usual. You have a fever. Get help right away if: Your peak flow reading is less than 50% of your personal best. This is in the red zone, which means "danger." You have trouble breathing. You develop chest pain or discomfort. Your medicines no longer seem to be helping. You are coughing up bloody mucus. You have a fever and your symptoms suddenly get worse. You have trouble swallowing. You feel very tired, and breathing becomes tiring. These symptoms may represent a serious problem that is an emergency. Do not wait to see if the symptoms will go away. Get medical help right away. Call your local emergency services (911 in the [place] Do not drive yourself to the hospital. Summary Asthma attacks are caused by narrowing or tightness in air passages, which causes shortness of breath, coughing, and loud breathing (wheezing). Many things can trigger an asthma attack, such as allergens, weather changes, exercise, strong odors, and smoke of any kind. If you have  an asthma attack, act quickly. Follow the emergency steps on your written asthma action plan. Get help right away if you have severe trouble breathing, chest pain, or fever, or if your home medicines are no longer helping with your symptoms. This information is not intended to replace advice given to you by your health care provider. Make sure you discuss any questions you have with your health care provider. Document Revised: 11/24/2019 Document  Reviewed: 11/24/2019 Elsevier Patient Education  2022 [place] Prevention, Adult Although you may not be able to change the fact that you have asthma, you can take actions to prevent episodes of asthma (asthma attacks). How can this condition affect me? Asthma attacks (flare ups) can cause trouble breathing, high-pitched whistling sounds when you breathe, most often when you breathe out (wheeze), and coughing. They may keep you from doing activities you like to do. What can increase my risk? Coming into contact with things that cause asthma symptoms (asthma triggers) can put you at risk for an asthma attack. Common asthma triggers include: Things you are allergic to (allergens), such as: Dust mite and cockroach droppings. Pet dander. Mold. Pollen from trees and grasses. Food allergies. This might be a specific food or added chemicals called sulfites. Irritants, such as: Weather changes including very cold, dry, or humid air. Smoke. This includes campfire smoke, air pollution, and tobacco smoke. Strong odors from aerosol sprays and fumes from perfume, candles, and household cleaners. Other triggers, such as: Certain medicines. This includes NSAIDs, such as ibuprofen and aspirin. Viral respiratory infections (colds), including runny nose (rhinitis) or infection in the sinuses (sinusitis). Activity, including exercise, laughing, or crying. Not using inhaled medicines (corticosteroids) as told. What actions can I take to prevent an asthma attack? Stay healthy. Stay up to date on all immunizations as told by your health care provider, including the yearly flu (influenza) vaccine and pneumonia vaccine. Many asthma attacks can be prevented by carefully following your written asthma action plan. Follow your asthma action plan Work with your health care provider to create a written asthma action plan. This plan should include: A list of your asthma triggers and how to avoid or  reduce them. A list of symptoms that you may have during an asthma attack. Information about which medicine to take, when to take the medicine, and how much of the medicine to take. Information to help you understand your peak flow measurements. Daily actions that you can take to prevent (control) your asthma symptoms. Contact information for your health care providers. If you have an asthma attack, act quickly. Follow the emergency steps on your written asthma action plan. This may prevent you from needing to go to the hospital. Monitor your asthma. To do this: Use your peak flow meter every morning and every evening for 2-3 weeks or as told by your health care provider. Record the results in a journal. A drop in your peak flow numbers on one or more days may mean that you are starting to have an asthma attack, even if you are not having symptoms. When you have asthma symptoms, write them down in a journal. Write down in your journal how often you need to use your fast-acting rescue inhaler. If you are using your rescue inhaler more often, it may mean that your asthma is not under control. Talk with your health care provider about adjusting your asthma treatment plan to help you prevent future asthma attacks and gain better control of your condition.  Lifestyle Avoid or reduce contact with known outdoor allergens by staying indoors, keeping windows closed, and using air conditioning when pollen and mold counts are high. Do not use any products that contain nicotine or tobacco. These products include cigarettes, chewing tobacco, and vaping devices, such as e-cigarettes. If you need help quitting, ask your health care provider. If you are overweight, consider a weight-loss plan. Find ways to cope with stress and your feelings, such as mindfulness, relaxation, or breathing exercises. Ask your health care provider if a breathing exercise program (pulmonary rehabilitation) may be helpful to control  symptoms and improve your quality of life. Medicines  Take over-the-counter and prescription medicines only as told by your health care provider. Do not stop taking your medicine and do not take less medicine even if you start to feel better. Let your health care provider know: How often you use your rescue inhaler. How often you have symptoms when you are taking your regular medicines. If you wake up at night because of asthma symptoms. If you have more trouble with your breathing when you exercise. Activity Do your normal activities as told by your health care provider. Ask your health care provider what activities are safe for you. Some people have asthma symptoms or more asthma symptoms when they exercise. This is called exercise-induced bronchoconstriction (EIB). If you have this problem, talk with your health care provider about how to manage EIB. Some tips to follow include: Use your fast-acting inhaler before exercise. Exercise indoors if it is very cold, humid, or the pollen and mold counts are high. Warm up and cool down before and after exercise. Stop exercising right away if your asthma symptoms start or get worse. Where to find more information Asthma and Allergy Foundation of America: www.aafa.org Centers for Disease Control and Prevention: http://www.wolf.info/ American Lung Association: www.lung.org National Heart, Lung, and Blood Institute: https://wilson-eaton.com/ World Health Organization: RoleLink.com.br Get help right away if: You have followed your written asthma action plan and your symptoms are not improving. Summary Asthma attacks (flare ups) can cause trouble breathing, high-pitched whistling sounds when you breathe, most often when you breathe out (wheeze), and coughing. Work with your health care provider to create a written asthma action plan. Do not stop taking your medicine and do not take less medicine even if you are feeling better. Do not use any products that contain  nicotine or tobacco. These products include cigarettes, chewing tobacco, and vaping devices, such as e-cigarettes. If you need help quitting, ask your health care provider. This information is not intended to replace advice given to you by your health care provider. Make sure you discuss any questions you have with your health care provider. Document Revised: 05/24/2021 Document Reviewed: 05/24/2021 Elsevier Patient Education  [place]. Fatigue If you have fatigue, you feel tired all the time and have a lack of energy or a lack of motivation. Fatigue may make it difficult to start or complete tasks because of exhaustion. In general, occasional or mild fatigue is often a normal response to activity or life. However, long-lasting (chronic) or extreme fatigue may be a symptom of a medical condition. Follow these instructions at home: General instructions Watch your fatigue for any changes. Go to bed and get up at the same time every day. Avoid fatigue by pacing yourself during the day and getting enough sleep at night. Maintain a healthy weight. Medicines Take over-the-counter and prescription medicines only as told by your health care provider. Take a multivitamin, if  told by your health care provider.  Do not use herbal or dietary supplements unless they are approved by your health care provider. Activity  Exercise regularly, as told by your health care provider. Use or practice techniques to help you relax, such as yoga, tai chi, meditation, or massage therapy. Eating and drinking  Avoid heavy meals in the evening. Eat a well-balanced diet, which includes lean proteins, whole grains, plenty of fruits and vegetables, and low-fat dairy products. Avoid consuming too much caffeine. Avoid the use of alcohol. Drink enough fluid to keep your urine pale yellow. Lifestyle Change situations that cause you stress. Try to keep your work and personal schedule in balance. Do not use any  products that contain nicotine or tobacco, such as cigarettes and e-cigarettes. If you need help quitting, ask your health care provider. Do not use drugs. Contact a health care provider if: Your fatigue does not get better. You have a fever. You suddenly lose or gain weight. You have headaches. You have trouble falling asleep or sleeping through the night. You feel angry, guilty, anxious, or sad. You are unable to have a bowel movement (constipation). Your skin is dry. You have swelling in your legs or another part of your body. Get help right away if: You feel confused. Your vision is blurry. You feel faint or you pass out. You have a severe headache. You have severe pain in your abdomen, your back, or the area between your waist and hips (pelvis). You have chest pain, shortness of breath, or an irregular or fast heartbeat. You are unable to urinate, or you urinate less than normal. You have abnormal bleeding, such as bleeding from the rectum, vagina, nose, lungs, or nipples. You vomit blood. You have thoughts about hurting yourself or others. If you ever feel like you may hurt yourself or others, or have thoughts about taking your own life, get help right away. You can go to your nearest emergency department or call: Your local emergency services (911 in the [place] A suicide crisis helpline, such as the [facility] at (510)239-1772 or 988 in the [place]. This is open 24 hours a day. Summary If you have fatigue, you feel tired all the time and have a lack of energy or a lack of motivation. Fatigue may make it difficult to start or complete tasks because of exhaustion. Long-lasting (chronic) or extreme fatigue may be a symptom of a medical condition. Exercise regularly, as told by your health care provider. Change situations that cause you stress. Try to keep your work and personal schedule in balance. This information is not intended to replace advice given to  you by your health care provider. Make sure you discuss any questions you have with your health care provider. Document Revised: 06/21/2021 Document Reviewed: 10/06/2020 Elsevier Patient Education  2022 [place].  Hypomagnesemia Hypomagnesemia is a condition in which the level of magnesium in the blood is too low. Magnesium is a mineral that is found in many foods. It is used in many different processes in the body. Hypomagnesemia can affect every organ in the body. In severe cases, it can cause life-threatening problems. What are the causes? This condition may be caused by: Not getting enough magnesium in your diet or not having enough healthy foods to eat (malnutrition). Problems with magnesium absorption in the intestines. Dehydration. Excessive use of alcohol. Vomiting. Severe or long-term (chronic) diarrhea. Some medicines, including medicines that make you urinate more often (diuretics). Certain diseases, such as  kidney disease, diabetes, celiac disease, and overactive thyroid. What are the signs or symptoms? Symptoms of this condition include: Loss of appetite, nausea, and vomiting. Involuntary shaking or trembling of a body part (tremor). Muscle weakness or tingling in the arms and legs. Sudden tightening of muscles (muscle spasms). Confusion. Psychiatric issues, such as: Depression and irritability. Psychosis. A feeling of fluttering of the heart (palpitations). Seizures. These symptoms are more severe if magnesium levels drop suddenly. How is this diagnosed? This condition may be diagnosed based on: Your symptoms and medical history. A physical exam. Blood and urine tests. How is this treated? Treatment depends on the cause and the severity of the condition. It may be treated by: Taking a magnesium supplement. This can be taken in pill form. If the condition is severe, magnesium is usually given through an IV. Making changes to your diet. You may be directed to eat  foods that have a lot of magnesium, such as green leafy vegetables, peas, beans, and nuts. Not drinking alcohol. If you are struggling not to drink, ask your health care provider for help. Follow these instructions at home: Eating and drinking   Make sure that your diet includes foods with magnesium. Foods that have a lot of magnesium in them include: Green leafy vegetables, such as spinach and broccoli. Beans and peas. Nuts and seeds, such as almonds and sunflower seeds. Whole grains, such as whole grain bread and fortified cereals. Drink fluids that contain salts and minerals (electrolytes), such as sports drinks, when you are active. Do not drink alcohol. General instructions Take over-the-counter and prescription medicines only as told by your health care provider. Take magnesium supplements as directed if your health care provider tells you to take them. Have your magnesium levels monitored as told by your health care provider. Keep all follow-up visits. This is important. Contact a health care provider if: You get worse instead of better. Your symptoms return. Get help right away if: You develop severe muscle weakness. You have trouble breathing. You feel that your heart is racing. These symptoms may represent a serious problem that is an emergency. Do not wait to see if the symptoms will go away. Get medical help right away. Call your local emergency services (911 in the [place] Do not drive yourself to the hospital. Summary Hypomagnesemia is a condition in which the level of magnesium in the blood is too low. Hypomagnesemia can affect every organ in the body. Treatment may include eating more foods that contain magnesium, taking magnesium supplements, and not drinking alcohol. Have your magnesium levels monitored as told by your health care provider. This information is not intended to replace advice given to you by your health care provider. Make sure you discuss any questions  you have with your health care provider. Document Revised: 04/25/2021 Document Reviewed: 04/25/2021 Elsevier Patient Education  2022 [place]. Vitamin D Deficiency Vitamin D deficiency is when your body does not have enough vitamin D. Vitamin D is important to your body for many reasons: It helps the body absorb two important minerals--calcium and phosphorus. It plays a role in bone health. It may help to prevent some diseases, such as diabetes and multiple sclerosis. It plays a role in muscle function, including heart function. If vitamin D deficiency is severe, it can cause a condition in which your bones become soft. In adults, this condition is called osteomalacia. In children, this condition is called rickets. What are the causes? This condition may be caused by: Not eating  enough foods that contain vitamin D. Not getting enough natural sun exposure. Having certain digestive system diseases that make it difficult for your body to absorb vitamin D. These diseases include Crohn's disease, chronic pancreatitis, and cystic fibrosis. Having a surgery in which a part of the stomach or a part of the small intestine is removed. Having chronic kidney disease or liver disease. What increases the risk? You are more likely to develop this condition if you: Are older. Do not spend much time outdoors. Live in a long-term care facility. Have had broken bones. Have weak or thin bones (osteoporosis). Have a disease or condition that changes how the body absorbs vitamin D. Have dark skin. Take certain medicines, such as steroid medicines or certain seizure medicines. Are overweight or obese. What are the signs or symptoms? In mild cases of vitamin D deficiency, there may not be any symptoms. If the condition is severe, symptoms may include: Bone pain. Muscle pain. Falling often. Broken bones caused by a minor injury. How is this diagnosed? This condition may be diagnosed with blood tests.  Imaging tests such as X-rays may also be done to look for changes in the bone. How is this treated? Treatment for this condition may depend on what caused the condition. Treatment options include: Taking vitamin D supplements. Your health care provider will suggest what dose is best for you. Taking a calcium supplement. Your health care provider will suggest what dose is best for you. Follow these instructions at home: Eating and drinking  Eat foods that contain vitamin D. Choices include: Fortified dairy products, cereals, or juices. Fortified means that vitamin D has been added to the food. Check the label on the package to see if the food is fortified. Fatty fish, such as salmon or trout. Eggs. Oysters. Mushrooms. The items listed above may not be a complete list of recommended foods and beverages. Contact a dietitian for more information. General instructions Take medicines and supplements only as told by your health care provider. Get regular, safe exposure to natural sunlight. Do not use a tanning bed. Maintain a healthy weight. Lose weight if needed. Keep all follow-up visits as told by your health care provider. This is important. How is this prevented? You can get vitamin D by: Eating foods that naturally contain vitamin D. Eating or drinking products that have been fortified with vitamin D, such as cereals, juices, and dairy products (including milk). Taking a vitamin D supplement or a multivitamin supplement that contains vitamin D. Being in the sun. Your body naturally makes vitamin D when your skin is exposed to sunlight. Your body changes the sunlight into a form of the vitamin that it can use. Contact a health care provider if: Your symptoms do not go away. You feel nauseous or you vomit. You have fewer bowel movements than usual or are constipated. Summary Vitamin D deficiency is when your body does not have enough vitamin D. Vitamin D is important to your body for  good bone health and muscle function, and it may help prevent some diseases. Vitamin D deficiency is primarily treated through supplementation. Your health care provider will suggest what dose is best for you. You can get vitamin D by eating foods that contain vitamin D, by being in the sun, and by taking a vitamin D supplement or a multivitamin supplement that contains vitamin D. This information is not intended to replace advice given to you by your health care provider. Make sure you discuss any questions you  have with your health care provider. Document Revised: 08/04/2018 Document Reviewed: 08/04/2018 Elsevier Patient Education  [place].

## 2021-12-19 NOTE — Progress Notes (Addendum)
Subjective:    Patient ID: Ryan Glass, male    DOB: 1956/04/19, 66 y.o.   MRN: [id number]  65y/o established married male pt c/o exhaustion since waking up this morning. No energy. Denies any other symptoms took last prednisone and augmentin yesterday.  Swelling around AICD has started again today but no redness.  Overall weight same and taking his medications as prescribed.  Using his fluticasone albuterol inhaler twice a day one puff and albuterol prn.  Used albuterol this am before work approximately 0500.  Feeling a bit more winded with activity denied wheezing/fever/chills/cough.  Lower eyelids still swollen but no new congestion nasal/discharge/cough/sinus pain/teeth pain/headache/dizziness/palpitations  Still taking his nose sprays and singulair along with claritin OTC that started last week..   Denied known sick contacts.     Review of Systems  Constitutional:  Positive for fatigue. Negative for activity change, appetite change, chills, diaphoresis, fever and unexpected weight change.  HENT:  Negative for congestion, dental problem, drooling, ear discharge, ear pain, hearing loss, mouth sores, nosebleeds, postnasal drip, rhinorrhea, sinus pressure, sinus pain, sneezing, sore throat, tinnitus, trouble swallowing and voice change.   Eyes:  Negative for photophobia, pain, discharge, redness, itching and visual disturbance.  Respiratory:  Positive for shortness of breath. Negative for cough, choking, chest tightness, wheezing and stridor.   Cardiovascular:  Negative for chest pain, palpitations and leg swelling.  Gastrointestinal:  Negative for diarrhea, nausea and vomiting.  Endocrine: Negative for cold intolerance and heat intolerance.  Genitourinary:  Negative for difficulty urinating.  Musculoskeletal:  Positive for arthralgias and back pain. Negative for neck pain and neck stiffness.  Skin:  Negative for color change, rash and wound.  Allergic/Immunologic: Positive for environmental  allergies.  Neurological:  Negative for dizziness, tremors, seizures, syncope, facial asymmetry, speech difficulty, weakness, light-headedness, numbness and headaches.  Hematological:  Negative for adenopathy. Does not bruise/bleed easily.  Psychiatric/Behavioral:  Negative for agitation, confusion and sleep disturbance.       Objective:   Physical Exam Vitals and nursing note reviewed.  Constitutional:      General: He is awake. He is not in acute distress.    Appearance: Normal appearance. He is well-developed and well-groomed. He is obese. He is not ill-appearing, toxic-appearing or diaphoretic.  HENT:     Head: Normocephalic and atraumatic.     Jaw: There is normal jaw occlusion.     Salivary Glands: Right salivary gland is not diffusely enlarged or tender. Left salivary gland is not diffusely enlarged or tender.     Right Ear: Hearing, ear canal and external ear normal. No decreased hearing noted. No drainage, swelling or tenderness. A middle ear effusion is present. Tympanic membrane is not injected, perforated, erythematous or retracted.     Left Ear: Hearing, ear canal and external ear normal. No decreased hearing noted. No drainage, swelling or tenderness. A middle ear effusion is present. Tympanic membrane is not injected, perforated, erythematous or retracted.     Nose: Nose normal. No congestion or rhinorrhea.     Right Turbinates: Not pale.     Left Turbinates: Not pale.     Right Sinus: No maxillary sinus tenderness or frontal sinus tenderness.     Left Sinus: No maxillary sinus tenderness or frontal sinus tenderness.     Mouth/Throat:     Lips: Pink. No lesions.     Mouth: Mucous membranes are moist. No lacerations, oral lesions or angioedema.     Dentition: Abnormal dentition. Has dentures. No gingival  swelling, dental abscesses or gum lesions.     Tongue: No lesions. Tongue does not deviate from midline.     Palate: No mass and lesions.     Pharynx: Uvula midline.  Pharyngeal swelling and posterior oropharyngeal erythema present.     Tonsils: No tonsillar exudate or tonsillar abscesses. 0 on the right. 0 on the left.     Comments: Cobblestoning posterior pharynx; bilateral allergic shiners; lower eyelids 1-2+/4 nonpitting edema; bilateral TMs air fluid level clear Eyes:     General: Lids are normal. Vision grossly intact. Gaze aligned appropriately. Allergic shiner present. No scleral icterus.       Right eye: No discharge.        Left eye: No discharge.     Extraocular Movements: Extraocular movements intact.     Right eye: No nystagmus.     Left eye: No nystagmus.     Conjunctiva/sclera: Conjunctivae normal.     Pupils: Pupils are equal, round, and reactive to light.  Neck:     Thyroid: No thyromegaly or thyroid tenderness.     Trachea: Trachea and phonation normal. No tracheal tenderness, abnormal tracheal secretions or tracheal deviation.  Cardiovascular:     Rate and Rhythm: Normal rate and regular rhythm.     Pulses: Normal pulses.          Radial pulses are 2+ on the right side and 2+ on the left side.     Heart sounds: Normal heart sounds, S1 normal and S2 normal. No murmur heard.   No friction rub. No gallop.     Comments: Trace nonpitting edema bilateral lower extremities at sock line Pulmonary:     Effort: Pulmonary effort is normal. No respiratory distress.     Breath sounds: Normal breath sounds and air entry. No stridor, decreased air movement or transmitted upper airway sounds. No decreased breath sounds, wheezing, rhonchi or rales.     Comments: Spoke full sentences without difficulty; no cough observed in exam room; on arrival to clinic slightly increased respiratory 18-20 rate but speaking full sentences without difficulty after walking across warehouse and sp02 steady 90-91% RA immediately checked. Chest:     Chest wall: Swelling, tenderness and edema present.  Breasts:    Right: Normal.     Left: No bleeding, nipple discharge  or skin change.    Abdominal:     General: Abdomen is flat.  Musculoskeletal:        General: No swelling, tenderness, deformity or signs of injury. Normal range of motion.     Right shoulder: No swelling, deformity, effusion or laceration. Normal range of motion. Normal strength.     Left shoulder: No swelling, deformity, effusion or laceration. Normal range of motion. Normal strength.     Right hand: No lacerations. Normal strength. Normal capillary refill.     Left hand: No lacerations. Normal strength. Normal capillary refill.     Cervical back: Normal range of motion and neck supple. No swelling, edema, deformity, erythema, signs of trauma, lacerations, rigidity, torticollis, tenderness or crepitus. No pain with movement or muscular tenderness.     Thoracic back: No swelling, edema, deformity, signs of trauma, lacerations, spasms or tenderness.     Right lower leg: No lacerations, tenderness or bony tenderness. 1+ Edema present.     Left lower leg: No lacerations, tenderness or bony tenderness. 1+ Edema present.  Lymphadenopathy:     Head:     Right side of head: No submental, submandibular, tonsillar, preauricular, posterior  auricular or occipital adenopathy.     Left side of head: No submental, submandibular, tonsillar, preauricular, posterior auricular or occipital adenopathy.     Cervical: No cervical adenopathy.     Right cervical: No superficial, deep or posterior cervical adenopathy.    Left cervical: No superficial, deep or posterior cervical adenopathy.     Upper Body:     Right upper body: No supraclavicular adenopathy.     Left upper body: No supraclavicular or axillary adenopathy.  Skin:    General: Skin is warm and dry.     Capillary Refill: Capillary refill takes less than 2 seconds.     Coloration: Skin is not ashen, cyanotic, jaundiced, mottled, pale or sallow.     Findings: No abrasion, abscess, acne, bruising, burn, ecchymosis, erythema, signs of injury,  laceration, lesion, petechiae, rash or wound.     Nails: There is no clubbing.  Neurological:     General: No focal deficit present.     Mental Status: He is alert and oriented to person, place, and time. Mental status is at baseline.     GCS: GCS eye subscore is 4. GCS verbal subscore is 5. GCS motor subscore is 6.     Cranial Nerves: Cranial nerves 2-12 are intact. No cranial nerve deficit, dysarthria or facial asymmetry.     Sensory: Sensation is intact. No sensory deficit.     Motor: Motor function is intact. No weakness, tremor, atrophy, abnormal muscle tone or seizure activity.     Coordination: Coordination is intact. Coordination normal.     Gait: Gait is intact. Gait normal.     Comments: In/out of chair without difficulty; gait sure and steady in clinic; bilateral hand grasp equal 5/5  Psychiatric:        Attention and Perception: Attention and perception normal.        Mood and Affect: Mood and affect normal.        Speech: Speech normal.        Behavior: Behavior normal. Behavior is cooperative.        Thought Content: Thought content normal.        Cognition and Memory: Cognition and memory normal.        Judgment: Judgment normal.     Latest Reference Range & Units 12/18/21 47:42  BASIC METABOLIC PANEL  Rpt !  Sodium 134 - 144 mmol/L 139  Potassium 3.5 - 5.2 mmol/L 4.7  Chloride 96 - 106 mmol/L 108 (H)  CO2 20 - 29 mmol/L 16 (L)  Glucose 70 - 99 mg/dL 95  BUN 8 - 27 mg/dL 33 (H)  Creatinine 0.76 - 1.27 mg/dL 1.40 (H)  Calcium 8.6 - 10.2 mg/dL 9.7  BUN/Creatinine Ratio 10 - 24  24  eGFR >59 mL/min/1.73 56 (L)  Magnesium 1.6 - 2.3 mg/dL 2.4 (H)  Vitamin D, 25-Hydroxy 30.0 - 100.0 ng/mL 24.4 (L)  Vitamin B12 232 - 1,245 pg/mL 343  !: Data is abnormal (H): Data is abnormally high (L): Data is abnormally low Rpt: View report in Results Review for more information  Latest Reference Range & Units 10/12/21 02:57  WBC 4.0 - 10.5 K/uL 4.0  RBC 4.22 - 5.81 MIL/uL 2.73  (L)  Hemoglobin 13.0 - 17.0 g/dL 9.2 (L)  HCT 39.0 - 52.0 % 27.1 (L)  MCV 80.0 - 100.0 fL 99.3  MCH 26.0 - 34.0 pg 33.7  MCHC 30.0 - 36.0 g/dL 33.9  RDW 11.5 - 15.5 % 14.6  Platelets 150 - 400  K/uL 146 (L)  nRBC 0.0 - 0.2 % 0.0  (L): Data is abnormally low Lab results from 12/18/2021 discussed with patient in clinic verbally.  Patient verbalized understanding agreed with plan of care and had no further questions at this time.    Assessment & Plan:  A-mild intermittent reactive airway disease with acute exacerbation, hypermagnesemia, swelling in chest, fatigue unspecified, low vitamin d  P-SPo2 trending down again today and increasing dyspnea with exertion.  Suspect cold front a factor in his symptoms as below freezing last night and he is at work 0600.  Using his fluticasone/salmeterol and albuterol.  Prednisone taper ended yesterday just on his low daily dose for rheumatology now.  Taking his claritin/singulair/flonase/nasal saline and encouraged him to continue.  Patient was started on orencia by rheumatology 11/08/21 previously on methotrexate and prednisone.  Still on low dose daily prednisone/RA flare knees.  History of covid hospitalization without intubation/pneumonia/PE and had chest CT showing small pleural effusion last week.  Consulted with patient's pulmonologist Dr [name].  Recommended to hold any further prednisone taper and start trelegy ellipta 100/62.5/25mcg dosing 1 puff daily #60 RF0 Rx sent to patient pharmacy and patient instructed to stop fluticasone-salmeterol once starting trelegy.  Manufacturer's coupon found to assist patient with copay requirement $100 his insurance.  Tier 2 quantity limited with [employer].  Patient instructed to stay inside as much as possible when temperatures outside near or below freezing.  Exitcare handouts on asthma attacks and preventing asthma attacks printed and given to patient.  Will follow up with patient Thursday 12/21/21 during NP  clinic.  Patient to contact me if new or worsening symptoms prior to that time.  If sp02 less than 90% RA and not improving with albuterol/rest he needs to be evaluated by a provider PCM/pulmonology.  Patient to schedule follow up with Dr Erin Fulling near 01/31/22 per last office visit. Patient had started PT/rehab recently and next appt 12/25/21. Patient verbalized understanding information/instructions, agreed with plan of care and had no further questions at this time.  Patient had started daily multivitamin with 175m magnesium in addition to his 4038mdaily and now magnesium slightly elevated 2.4 may be contributing to his occasional flushing.  Discussed to decrease magnesium supplement 40054mo every other day and continue multivitamin with 100m37mily and will recheck magnesium level in 1 month nonfasting.  Exitcare handout on hypomagnesemia printed and given to patient.  Patient verbalized understanding information/instructions, agreed with plan of care and had no further questions at this time.  Edema noted today around AICD again without erythema and no edema neck/lymphadenopathy; slightly tender at lateral border and that is where swelling greatest.  Swelling obscuring borders of device again where typically device clearly borders visible in left anterior chest.  PA Tillery contacted and notified and scheduled follow up with patient 12/28/2021. He did not recommend antibiotics at this time based on evaluation of patient last week when swelling almost completely resolved but still minor lymphadenopathy present cervical.  Discussed with patient to monitor for worsening especially red streaks/fever/chills.  If swelling increasing take pictures with his phone camera again tomorrow.  Took photo today so he can show his cardiology provider progression of swelling at follow up.  Patient given doxycycline 100mg75mBID #20 RF0 from [facility] today.  Hold and start if red streaks around AICD/fever/chills/productive cough  tan/green opaque mucous as clinic closed tomorrow.  Patient can contact me at work from home number [phone]/concerns.  Patient verbalized understanding information/instructions,  agreed with plan of care and had no further questions at this time.  Patient with history of covid/reactive airway disease with acute exacerbation/GI bleed related anemia and recently started PT/rehab cardiac/pulmonary, OSA, CKD, heart failure with cardiac hypertrophy, rheumatoid arthritis, hypertension, and low vitamin D.  All could be contributing to fatigue especially if falling asleep on couch without CPAP.  Creatinine and GFR slightly improved on repeat labs today but still not back to normal range.  Continue hydrating to his fluid restriction of 2 liters, avoiding NSAIDS will continue to monitor fatigue as also recently started multivitamin with iron 82m daily.  Thyroid normal last check Jun 2022.  Hgba1c and executive panel scheduled to be checked at work for insurance discount April 2023.  Consider checking Hgba1c and CBC sooner if worsening symptoms.  No covid home testing indicated at this time but if new URI symptoms/headache will recommend covid testing also due to continued lower eyelid swelling/sinus inflammation/allergic shiners and XBB variant increasing in state along with local testing approaching 25% positive rate.  Flu and RSV decreasing but parainfluenza/adenovirus still circulating locally also.  Exitcare handout on fatigue printed and given to patient.  Patient verbalized understanding information/instructions, agreed with plan of care and had no further questions at this time.  Patient instructed to continue multivitamin with 400IU daily.  Consider adding 1000 IU daily if spouse has OTC at home and recheck Vitamin D level in 3 months nonfasting schedule with RN HHildred Alamin  Discussed fat soluble vitamin and will take time to build up his body levels.  Exitcare handout vitamin D deficiency printed and  given to patient.  Patient verbalized understanding information/instructions, agreed with plan of care and had no further questions at this time.  TGerarda FractionNP-C

## 2021-12-20 ENCOUNTER — Encounter: Payer: Self-pay | Admitting: Registered Nurse

## 2021-12-21 ENCOUNTER — Other Ambulatory Visit: Payer: Self-pay

## 2021-12-21 ENCOUNTER — Ambulatory Visit: Payer: Self-pay | Admitting: Registered Nurse

## 2021-12-21 ENCOUNTER — Encounter: Payer: Self-pay | Admitting: Registered Nurse

## 2021-12-21 VITALS — HR 68 | Resp 16

## 2021-12-21 DIAGNOSIS — G8929 Other chronic pain: Secondary | ICD-10-CM

## 2021-12-21 DIAGNOSIS — M25561 Pain in right knee: Secondary | ICD-10-CM

## 2021-12-21 DIAGNOSIS — J4521 Mild intermittent asthma with (acute) exacerbation: Secondary | ICD-10-CM

## 2021-12-21 DIAGNOSIS — R5383 Other fatigue: Secondary | ICD-10-CM

## 2021-12-21 DIAGNOSIS — R222 Localized swelling, mass and lump, trunk: Secondary | ICD-10-CM
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
## 2021-12-21 NOTE — Telephone Encounter (Signed)
Called and spoke to pt. Pt states he has been having an increase in SOB, and hypoxia for 2-3 weeks. Pt states he keeps his pulse/ox around and checks it frequently throughout the day. Pt states when he is active it can range from the mid to high 80s. Pt has been scheduled an appt tomorrow 1/13 at 430pm. Pt states he has to work today and tomorrow. Advised pt he should not be working and should go home. Pt refused this. Pt states if he feels worse (lethargy, increase in ShOB, CP/tightness, disorientation) to go to ED. Pt verbalized understanding.    Will forward to TP as FYI for appt tomorrow.

## 2021-12-22 ENCOUNTER — Encounter: Payer: Self-pay | Admitting: Adult Health

## 2021-12-22 ENCOUNTER — Ambulatory Visit (INDEPENDENT_AMBULATORY_CARE_PROVIDER_SITE_OTHER): Payer: No Typology Code available for payment source | Admitting: Adult Health

## 2021-12-22 DIAGNOSIS — R0609 Other forms of dyspnea: Secondary | ICD-10-CM | POA: Diagnosis not present

## 2021-12-22 DIAGNOSIS — J4530 Mild persistent asthma, uncomplicated: Secondary | ICD-10-CM | POA: Diagnosis not present

## 2021-12-22 DIAGNOSIS — G4733 Obstructive sleep apnea (adult) (pediatric): Secondary | ICD-10-CM

## 2021-12-22 NOTE — Patient Instructions (Signed)
Continue on CPAP At bedtime   Continue on TRELEGY 1 puff daily  Albuterol inhaler As needed   Activity as tolerated.  Follow up with Dr. Francine Gravenewald in 4-6 weeks and As needed    Please contact office for sooner follow up if symptoms do not improve or worsen or seek emergency care

## 2021-12-22 NOTE — Progress Notes (Signed)
@Patient  ID: Ryan Glass, male    DOB: May 16, 1956, 66 y.o.   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  CT chest angio May 25, 2021 small peripheral pulmonary embolus in the lateral segment right lower lobe pulmonary artery branch.  He was treated with IV heparin and transitioned to Eliquis.  2D echo showed EF 45 to 50%, mildly decreased LV function.  Severe asymmetrical left ventricular hypertrophy, grade 1 diastolic dysfunction, moderately reduced right ventricular systolic function.  Moderately enlarged ventricular size.  Moderately elevated pulmonary artery systolic pressure.   Underwent a cardiac cath that showed nonobstructive disease.  No pulmonary hypertension on right heart cath.  RV was mildly dilated but RV function was normal and LVEF was normal.  .High-resolution CT chest June 07, 2021 showed bandlike scarring and partial atelectasis in the dependent lungs unchanged from prior exam.  No evidence of fibrotic interstitial lung disease.  A sending thoracic aorta enlargement measuring 4.4 cm.  PFTs 11/2020 normal ratio, no airway obstruction, no restriction, DLCO 71%. Repeat PFTs 08/01/21 show normal ratio, no obstruction or restriction and DLCO 89%.     He was admitted 08/02/21 for concerns of ventricular trachycardia noted on his Zio extended cardiac monitor. TTE bubble study on 08/03/21 showed positive shunting within 3-6 cardiac cycles suggestive of interatrial shunt. The echo was also concerning for hypertrophic cardiomyopathy. He under went ICD placement on 08/07/21. He was discharged  home with carvedilol and verapamil for rate control medications.   10/2021- no desats with ambulation , not moving forward with PFT closure.   12/22/2021 Follow up : Asthma, OSA , PE  Patient returns for 3 month follow up . He is followed for suspected Mild asthma . Currently on  On Trelegy daily. He was recently changed from Advair to Trelegy unsure if helping with dyspnea . Complains of ongoing dyspnea with activity , decreased activity tolerance. Says at home O2 sats drop at home . Patient complains that his breathing is not getting much better.  He continues to have shortness of breath with activities. Has had an extensive work-up with cardiology. Recently on antibiotics for bronchitis.  Cough and congestion are improved.  Patient's pulse oximeter at home shows that his oxygen levels drop in 80s.  However today patient was walked in the office with O2 saturations remaining 97% with ambulation on room air.  Heart rate was at 80 bpm.  As patient sat down O2 saturations did drop to 92 to 93% but never desatted below 90% .  Patient wears CPAP every single night.  CPAP download was requested.  Patient is unsure who his DME company is.  Recent CT chest 12/13/21 showed stable TAA, clear lungs    Allergies  Allergen Reactions   Amlodipine Besylate Swelling and Other (See Comments)    Leg swelling with 10 mg daily am dosing; decreased with BID 5 mg dosing   Aspirin Itching    Immunization History  Administered Date(s) Administered   Influenza,inj,Quad PF,6+ Mos 09/09/2014, 09/10/2015, 10/09/2016, 10/01/2017, 09/26/2018, 09/22/2019, 09/09/2020   Influenza,inj,quad, With  Preservative 09/09/2014, 09/10/2015   Influenza-Unspecified 09/09/2014, 09/10/2015, 10/09/2016, 10/01/2017   Moderna SARS-COV2 Booster Vaccination 12/27/2020   PFIZER(Purple Top)SARS-COV-2 Vaccination 02/18/2020, 03/10/2020, 07/25/2020   Tdap 09/07/2008, 09/23/2017   Zoster Recombinat (Shingrix) 02/05/2018, 06/30/2018    Past  Medical History:  Diagnosis Date   Ascending aortic aneurysm    a. CT 05/2021 showed 4.4x4.4cm TAA. Stable on chest CTA 12/2021 at 4.4cm.   Bilateral lower extremity edema    Celiac artery dilatation (HCC)    1.8cm at the celiac trunk on Chest CT 12/2021   Chronic gout without tophus    followed by dr Gerilyn Nestle    (06-08-2020  per pt last episode right knee 3 wks ago)   CKD (chronic kidney disease), stage II    nephrology--- Jen Mow PA (10-29-2019 note in epic scanned in  media)   Heart failure with mid-range ejection fraction ([place]) 05/27/2021   05/26/21: Left ventricular ejection fraction, by estimation, is 45 to 50%. There is severe asymmetric left ventricular hypertrophy. G1DD present.    History of COVID-19 06/07/2021   HOCM (hypertrophic obstructive cardiomyopathy) ([place])    s/p ICD 07/2021   Hypertension    followed by cardiology, dr t. turner   (05-14-2018 nuclear study in epic , normal perfusion with nuclear ef 61%)   Left hydrocele    Low serum potassium level 04/27/2014   OSA on CPAP    per pt uses every night   Palpitations followed by dr t. [name]   06-08-2020  still feels palipations due to PVCs when exertion but not with chest pain/ discomfort   Pneumonia due to COVID-19 virus    PVC's (premature ventricular contractions) cardiologist--- dr t. turner   Status post PVC ablation by [name] 2019 with recurrence of frequent PVCs/bigeminy;  prior pseudobradycardia r/t pvcs   Rheumatoid arthritis involving multiple sites [facility])    rheumotology--- dr a. Gerilyn Nestle  ([facility] in [place])    Tobacco History: Social History   Tobacco Use  Smoking Status Never  Smokeless Tobacco Never   Counseling given: Not Answered   Outpatient Medications Prior to Visit  Medication Sig Dispense Refill   Abatacept (ORENCIA CLICKJECT) 0000000 MG/ML SOAJ Inject into the skin once a week.     Abatacept (ORENCIA CLICKJECT) 0000000 MG/ML SOAJ Inject into the skin.     Acetaminophen-Codeine 300-30 MG  tablet Take 1-2 tablets by mouth every 12 (twelve) hours as needed for pain.     albuterol (VENTOLIN HFA) 108 (90 Base) MCG/ACT inhaler Inhale 2 puffs into the lungs every 4 (four) hours as needed for wheezing or shortness of breath. 8 g 2   apixaban (ELIQUIS) 5 MG TABS tablet Take 1 tablet (5 mg total) by mouth 2 (two) times daily. 60 tablet 5   atorvastatin (LIPITOR) 40 MG tablet Take 1 tablet (40 mg total) by mouth daily. 90 tablet 3   carvedilol (COREG) 6.25 MG tablet Take 6.25 mg by mouth 2 (two) times daily.     colchicine 0.6 MG tablet Take 0.6-1.2 mg by mouth daily as needed (for gout flares).     diltiazem (CARDIZEM CD) 180 MG 24 hr capsule Take 180 mg by mouth daily.     doxycycline (VIBRA-TABS) 100 MG tablet Take 1 tablet (100 mg total) by mouth 2 (two) times daily for 7 days. 20 tablet 0   fluticasone (FLONASE) 50 MCG/ACT nasal spray Place 1 spray into both nostrils daily.     Fluticasone-Umeclidin-Vilant (TRELEGY ELLIPTA) 100-62.5-25 MCG/ACT AEPB Inhale 1 puff  into the lungs daily. 60 each 0   folic acid (FOLVITE) 1 MG tablet Take 1 mg by mouth daily.     furosemide (LASIX) 40 MG tablet Take 1 tablet (40 mg total) by mouth daily as needed (weight gain). Take 1 tablet by mouth daily for 3 days, then decrease to only as needed for increase weight gain 30 tablet 3   gabapentin (NEURONTIN) 600 MG tablet Take 600 mg by mouth with breakfast, with lunch, and with evening meal.     hydrALAZINE (APRESOLINE) 100 MG tablet Take 1 tablet (100 mg total) by mouth every 8 (eight) hours. 90 tablet 6   Magnesium Oxide 400 MG CAPS Take 1 capsule (400 mg total) by mouth every other day. While taking daily multivitamin with 100mg  magnesium every day 90 capsule 3   metoprolol tartrate (LOPRESSOR) 50 MG tablet Take 1 tablet (50 mg total) by mouth 2 (two) times daily. 60 tablet 1   montelukast (SINGULAIR) 10 MG tablet Take 1 tablet (10 mg total) by mouth at bedtime. 90 tablet 3   pantoprazole (PROTONIX) 40 MG  tablet Take 1 tablet (40 mg total) by mouth 2 (two) times daily. 60 tablet 1   potassium chloride SA (KLOR-CON) 20 MEQ tablet Take 2 tablets (40 mEq total) by mouth 2 (two) times daily. 120 tablet 6   predniSONE (DELTASONE) 5 MG tablet Take 5 mg by mouth daily with breakfast.     predniSONE (DELTASONE) 5 MG tablet Take by mouth.     promethazine-dextromethorphan (PROMETHAZINE-DM) 6.25-15 MG/5ML syrup Take 5 mLs by mouth 4 (four) times daily as needed for cough. 118 mL 0   sildenafil (REVATIO) 20 MG tablet Take 20 mg by mouth daily as needed (erectile dysfunction).     sotalol (BETAPACE) 120 MG tablet Take 1 tablet (120 mg total) by mouth every 12 (twelve) hours. 60 tablet 6   spironolactone (ALDACTONE) 25 MG tablet Take 1 tablet (25 mg total) by mouth daily. 30 tablet 6   verapamil (CALAN-SR) 180 MG CR tablet Take 180 mg by mouth daily.     loratadine (CLARITIN) 10 MG tablet Take 1 tablet (10 mg total) by mouth daily for 7 days. 30 tablet 11   No facility-administered medications prior to visit.     Review of Systems:   Constitutional:   No  weight loss, night sweats,  Fevers, chills, + fatigue, or  lassitude.  HEENT:   No headaches,  Difficulty swallowing,  Tooth/dental problems, or  Sore throat,                No sneezing, itching, ear ache, nasal congestion, post nasal drip,   CV:  No chest pain,  Orthopnea, PND, swelling in lower extremities, anasarca, dizziness, palpitations, syncope.   GI  No heartburn, indigestion, abdominal pain, nausea, vomiting, diarrhea, change in bowel habits, loss of appetite, bloody stools.   Resp: .  No chest wall deformity  Skin: no rash or lesions.  GU: no dysuria, change in color of urine, no urgency or frequency.  No flank pain, no hematuria   MS:  No joint pain or swelling.  No decreased range of motion.  No back pain.    Physical Exam  BP 128/70 (BP Location: Left Arm, Patient Position: Sitting, Cuff Size: Normal)    Pulse 80    Temp 98.8  F (37.1 C) (Oral)    Ht 5\' 10"  (1.778 m)    Wt 232 lb (105.2 kg)    SpO2 93%  BMI 33.29 kg/m   GEN: A/Ox3; pleasant , NAD, well nourished    HEENT:  [place]/AT,   NOSE-clear, THROAT-clear, no lesions, no postnasal drip or exudate noted.   NECK:  Supple w/ fair ROM; no JVD; normal carotid impulses w/o bruits; no thyromegaly or nodules palpated; no lymphadenopathy.    RESP  Clear  P & A; w/o, wheezes/ rales/ or rhonchi. no accessory muscle use, no dullness to percussion  CARD:  RRR, no m/r/g, tr  peripheral edema, pulses intact, no cyanosis or clubbing.  GI:   Soft & nt; nml bowel sounds; no organomegaly or masses detected.   Musco: Warm bil, no deformities or joint swelling noted.   Neuro: alert, no focal deficits noted.    Skin: Warm, no lesions or rashes    Lab Results:    BMET   BNP   Imaging: CT ANGIO CHEST AORTA W/CM & OR WO/CM  Result Date: 12/13/2021 CLINICAL DATA:  Thoracic aneurysm follow-up EXAM: CT ANGIOGRAPHY CHEST WITH CONTRAST TECHNIQUE: Multidetector CT imaging of the chest was performed using the standard protocol during bolus administration of intravenous contrast. Multiplanar CT image reconstructions and MIPs were obtained to evaluate the vascular anatomy. CONTRAST:  56mL OMNIPAQUE IOHEXOL 350 MG/ML SOLN COMPARISON:  06/06/2021 CTA chest PE protocol, 11/29/2020 TIA protocol FINDINGS: Cardiovascular: Redemonstrated aneurysmal ascending thoracic aorta measuring up to 4.4 cm in diameter, unchanged, which tapers to measure up to 3.6 cm in diameter at the arch and 3.2 cm in the descending thoracic aorta. No evidence of dissection or para-aortic stranding. Redemonstrated cardiomegaly and small pericardial effusion. Although the study is not timed for evaluation of pulmonary embolism, no central pulmonary embolism is seen. Interval placement of left chest cardiac device, with leads in the right atrium and ventricle. Mediastinum/Nodes: No enlarged mediastinal, hilar, or  axillary lymph nodes. Thyroid gland, trachea, and esophagus demonstrate no significant findings. Lungs/Pleura: Lungs are clear. No pleural effusion or pneumothorax. Upper Abdomen: Dilatation of the celiac trunk, measuring to 1.8 cm, unchanged. Vascular calcifications. Small low-density lesions in the kidneys, too small to characterize but likely renal cysts. Musculoskeletal: No acute osseous abnormality. Redemonstrated degenerative changes with mild wedging in the lower thoracic and upper lumbar spine. Review of the MIP images confirms the above findings. IMPRESSION: 1. Overall unchanged ascending thoracic aortic aneurysm, measuring approximately 4 4 cm. Recommend annual imaging followup by CTA or MRA. This recommendation follows 2010 ACCF/AHA/AATS/ACR/ASA/SCA/SCAI/SIR/STS/SVM Guidelines for the Diagnosis and Management of Patients with Thoracic Aortic Disease. Circulation. 2010; [id number]. Aortic aneurysm NOS (ICD10-I71.9) 2. Stable dilatation of the celiac trunk, measuring approximately 1.8 cm. 3. Cardiomegaly with a small pericardial effusion. Electronically Signed   By: Merilyn Baba M.D.   On: 12/13/2021 14:42   CUP PACEART INCLINIC DEVICE CHECK  Result Date: 12/13/2021 ICD check in clinic. Normal device function. Thresholds and sensing consistent with previous device measurements. Impedance trends stable over time. No mode switches. No ventricular arrhythmias. Histogram distribution appropriate for patient and level of  activity. No changes made this session. Device programmed at appropriate safety margins. Device programmed to optimize intrinsic conduction. Estimated longevity 7 years. Pt enrolled in remote follow-up. Patient education completed including shock plan. Auditory/vibratory alert demonstrated.     PFT Results Latest Ref Rng & Units 08/01/2021 11/16/2020  FVC-Pre L 3.75 4.14  FVC-Predicted Pre % 84 92  FVC-Post L 3.65 4.01  FVC-Predicted Post % 82 89  Pre FEV1/FVC % % 78 82  Post  FEV1/FCV % % 82 85  FEV1-Pre  L 2.91 3.38  FEV1-Predicted Pre % 87 101  FEV1-Post L 2.99 3.40  DLCO uncorrected ml/min/mmHg 23.24 18.55  DLCO UNC% % 89 71  DLCO corrected ml/min/mmHg 25.93 18.55  DLCO COR %Predicted % 99 71  DLVA Predicted % 100 86  TLC L 6.95 7.23  TLC % Predicted % 102 106  RV % Predicted % 126 121    No results found for: NITRICOXIDE      Assessment & Plan:   No problem-specific Assessment & Plan notes found for this encounter.     Rexene Edison, NP 12/22/2021

## 2021-12-23 NOTE — Telephone Encounter (Signed)
Epic reviewed patient seen by NP at pulmonology office yesterday 12/22/2021  Sp02 RA improved to 93-97% since starting trelegy inhaler.  Pulmonology would like patient to:  Continue on CPAP At bedtime   Continue on TRELEGY 1 puff daily  Albuterol inhaler As needed   Activity as tolerated.  Follow up with Dr. Francine Graven in 4-6 weeks and As needed    Will follow up with patient via telephone tomorrow 12/24/21.

## 2021-12-25 ENCOUNTER — Ambulatory Visit: Payer: No Typology Code available for payment source | Admitting: Physical Therapy

## 2021-12-25 NOTE — Telephone Encounter (Signed)
Patient seen in [facility]center today.  Has not picked up trelegy inhaler yet as pharmacy did not apply coupon.  Still using his advair inhaler.  Last albuterol use prior to leaving house this am.  Breathing not feeling well today.  Fatigued.  Sp02 at home still reading in the 80s even after battery change.  He has second opinion with [facility] cardiology this week.  Denied worsening swelling around AICD same as last week. On exam edema around AICD decreased compared to last evaluation 1/12 but still not at baseline where device outline sharp and clear anterior left upper chest.  No erythema/increased temperature or fluctuance on palpation.  Still slightly tender lateral to AICD.  Patient may cancel his follow up with PA [place] since swelling has improved again. Denied gaining weight/leg swelling.  No edema on check today. denied redness/fever/chills.  Lower eyelids still swollen  Check RA sp02 95% when active/talking but when sits at rest will drop to 88% and when he starts talking improve to 95% again.  HR 66 RRR denied palpitations and pulses regular.  BBS CTA but decreased bases.  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  Continue his singulair, claritin, flonase and nasal saline use also.  Patient considering [facility] pulmonology second opinion also. Did not feel like NP at Dr Lanora Manis office listened to him regarding symptoms/how he was feeling.   Patient spoke full sentences without difficulty; no shortness of breath/dyspnea/wheezing/rhonchi or rales on exam.  Gait sure and steady, Skin warm dry and pink.  No cough/nasal congestion/throat clearing during 10 minute conversation.  Patient to contact RN Rolly Salter for assistance with manufacturer coupon for trelegy.  Patient verbalized understanding information/instructions, agreed with plan of care and had no further questions at time.

## 2021-12-26 DIAGNOSIS — J45909 Unspecified asthma, uncomplicated: Secondary | ICD-10-CM | POA: Insufficient documentation

## 2021-12-26 NOTE — Assessment & Plan Note (Signed)
Patient has ongoing dyspnea with activity.  Suspect this is multifactorial with deconditioning, cardiomyopathy, PFO, pulmonary hypertension.  No desaturations with ambulation are noted. With continue with cardiopulmonary rehab. Need CPAP download to evaluate control.  Once we get this can decide if he needs a overnight oximetry test on CPAP or going forward with a CPAP titration study if indicated Recent work-up showed no evidence of interstitial lung disease on high-resolution CT chest.  CT earlier this month showed clear lungs. For now we will continue on asthma maintenance regimen.  Patient is to keep follow-up with cardiology.  Plan  Patient Instructions  Continue on CPAP At bedtime   Continue on TRELEGY 1 puff daily  Albuterol inhaler As needed   Activity as tolerated.  Follow up with Dr. Francine Graven in 4-6 weeks and As needed    Please contact office for sooner follow up if symptoms do not improve or worsen or seek emergency care    '

## 2021-12-26 NOTE — Assessment & Plan Note (Signed)
Continue on CPAP At bedtime   Need CPAP download to check control and compliance  Patient will call back with DME company   Plan  Patient Instructions  Continue on CPAP At bedtime   Continue on TRELEGY 1 puff daily  Albuterol inhaler As needed   Activity as tolerated.  Follow up with Dr. Francine Graven in 4-6 weeks and As needed    Please contact office for sooner follow up if symptoms do not improve or worsen or seek emergency care

## 2021-12-26 NOTE — Assessment & Plan Note (Signed)
Recent flare - clinically appears improved .  Continue to have ongoing dyspnea despite normal PFT . Recent CT chest showed clear lungs.  HRCT 05/2021 with neg ILD, stable scarring .  No desats with ambulation today in office  For now continue on Trelegy .  Asthma action plan discussed   Plan  Patient Instructions  Continue on CPAP At bedtime   Continue on TRELEGY 1 puff daily  Albuterol inhaler As needed   Activity as tolerated.  Follow up with Dr. Francine Graven in 4-6 weeks and As needed    Please contact office for sooner follow up if symptoms do not improve or worsen or seek emergency care

## 2021-12-28 ENCOUNTER — Ambulatory Visit: Payer: No Typology Code available for payment source | Admitting: Physical Therapy

## 2021-12-28 ENCOUNTER — Encounter: Payer: No Typology Code available for payment source | Admitting: Student

## 2021-12-28 DIAGNOSIS — Q2112 Patent foramen ovale: Secondary | ICD-10-CM | POA: Insufficient documentation

## 2022-01-01 ENCOUNTER — Encounter: Payer: Self-pay | Admitting: Physical Therapy

## 2022-01-01 ENCOUNTER — Ambulatory Visit: Payer: No Typology Code available for payment source | Admitting: Physical Therapy

## 2022-01-01 ENCOUNTER — Other Ambulatory Visit: Payer: Self-pay

## 2022-01-01 DIAGNOSIS — R262 Difficulty in walking, not elsewhere classified: Secondary | ICD-10-CM

## 2022-01-01 DIAGNOSIS — M6281 Muscle weakness (generalized): Secondary | ICD-10-CM

## 2022-01-01 NOTE — Therapy (Signed)
[place]. [place], [place], [address] Phone: [phone]   Fax:  [phone]  Physical Therapy Treatment  Patient Details  Name: Ryan Glass MRN: [id number] Date of Birth: 08-Oct-1956 Referring Provider (PT): Sherren Mocha   Encounter Date: 01/01/2022   PT End of Session - 01/01/22 1705     Visit Number 2    Number of Visits 18    Date for PT Re-Evaluation 02/14/22    PT Start Time [id number]    PT Stop Time 1713    PT Time Calculation (min) 38 min    Activity Tolerance Patient limited by fatigue;Patient tolerated treatment well;Other (comment)   low SATS with rest.   Behavior During Therapy [facility] for tasks assessed/performed             Past Medical History:  Diagnosis Date   Ascending aortic aneurysm    a. CT 05/2021 showed 4.4x4.4cm TAA. Stable on chest CTA 12/2021 at 4.4cm.   Bilateral lower extremity edema    Celiac artery dilatation (HCC)    1.8cm at the celiac trunk on Chest CT 12/2021   Chronic gout without tophus    followed by dr Gerilyn Nestle    (06-08-2020  per pt last episode right knee 3 wks ago)   CKD (chronic kidney disease), stage II    nephrology--- Jen Mow PA (10-29-2019 note in epic scanned in  media)   Heart failure with mid-range ejection fraction ([place]) 05/27/2021   05/26/21: Left ventricular ejection fraction, by estimation, is 45 to 50%. There is severe asymmetric left ventricular hypertrophy. G1DD present.    History of COVID-19 06/07/2021   HOCM (hypertrophic obstructive cardiomyopathy) ([place])    s/p ICD 07/2021   Hypertension    followed by cardiology, dr t. turner   (05-14-2018 nuclear study in epic , normal perfusion with nuclear ef 61%)   Left hydrocele    Low serum potassium level 04/27/2014   OSA on CPAP    per pt uses every night   Palpitations followed by dr t. [name]   06-08-2020  still feels palipations due to PVCs when exertion but not with chest pain/ discomfort    Pneumonia due to COVID-19 virus    PVC's (premature ventricular contractions) cardiologist--- dr t. turner   Status post PVC ablation by [name] 2019 with recurrence of frequent PVCs/bigeminy;  prior pseudobradycardia r/t pvcs   Rheumatoid arthritis involving multiple sites [facility])    rheumotology--- dr a. Gerilyn Nestle  ([facility] [place])    Past Surgical History:  Procedure Laterality Date   BIOPSY  10/08/2021   Procedure: BIOPSY;  Surgeon: Daryel November, MD;  Location: [place];  Service: Gastroenterology;;   Kathleen Argue STUDY  08/18/2021   Procedure: BUBBLE STUDY;  Surgeon: Josue Hector, MD;  Location: [facility] ENDOSCOPY;  Service: Cardiovascular;;   ESOPHAGOGASTRODUODENOSCOPY N/A 10/08/2021   Procedure: ESOPHAGOGASTRODUODENOSCOPY (EGD);  Surgeon: Daryel November, MD;  Location: [place];  Service: Gastroenterology;  Laterality: N/A;   HYDROCELE EXCISION Left 06/14/2020   Procedure: LEFT  HYDROCELECTOMY ADULT;  Surgeon: Robley Fries, MD;  Location: [facility];  Service: Urology;  Laterality: Left;   ICD IMPLANT N/A 08/07/2021   Procedure: ICD IMPLANT;  Surgeon: Evans Lance, MD;  Location: [place] CV LAB;  Service: Cardiovascular;  Laterality: N/A;   INCISIONAL HERNIA REPAIR  02-23-2016   @[facility]    LAPAROSCOPIC   LAPAROSCOPIC INGUINAL HERNIA REPAIR Bilateral 08-22-2015  @[facility]   AND UMBILICAL HERNIA REPAIR   PVC ABLATION N/A 10/07/2018   Procedure: PVC ABLATION;  Surgeon: Evans Lance, MD;  Location: [place] CV LAB;  Service: Cardiovascular;  Laterality: N/A;   RIGHT/LEFT HEART CATH AND CORONARY ANGIOGRAPHY N/A 05/29/2021   Procedure: RIGHT/LEFT HEART CATH AND CORONARY ANGIOGRAPHY;  Surgeon: Burnell Blanks, MD;  Location: [place] CV LAB;  Service: Cardiovascular;  Laterality: N/A;   TEE WITHOUT CARDIOVERSION N/A 08/18/2021   Procedure: TRANSESOPHAGEAL ECHOCARDIOGRAM (TEE);  Surgeon: Josue Hector, MD;  Location: [facility] ENDOSCOPY;  Service:  Cardiovascular;  Laterality: N/A;   UMBILICAL HERNIA REPAIR  child    There were no vitals filed for this visit.   Subjective Assessment - 01/01/22 1635     Subjective Patient reports that his SATS are still dropping into the 80's at rest. He saw his Pulmonologist who again assessed him after ambulation, with SATS in the 90's. They apparently dropped to low 90's at rest, but not below.    Pertinent History RA, L ventricular hypertrophy, chronic desaturation, PVC with ICD implant 8/22,    Currently in Pain? Yes    Pain Score 3     Pain Location Knee    Pain Orientation Left;Right    Pain Descriptors / Indicators Aching    Pain Type Chronic pain    Pain Onset More than a month ago    Pain Frequency Intermittent    Aggravating Factors  weather, walking                               [facility] Adult PT Treatment/Exercise - 01/01/22 0001       Exercises   Exercises Knee/Hip      Knee/Hip Exercises: Aerobic   Nustep L4 x 6 minutes      Knee/Hip Exercises: Supine   Bridges Strengthening;Both;1 set;10 reps    Bridges with Clamshell Strengthening;Both;1 set;10 reps   Green t band resistance.   Straight Leg Raises Strengthening;Both;1 set;10 reps    Other Supine Knee/Hip Exercises supine marches iwth pelvic tilt and stable trunk. 2 x 5 with each leg.                     PT Education - 01/01/22 1704     Education Details HEP, breathing techniaues during exercise.    Person(s) Educated Patient    Methods Explanation;Demonstration;Handout    Comprehension Returned demonstration;Verbalized understanding              PT Short Term Goals - 01/01/22 1716       PT SHORT TERM GOAL #1   Title I with basic HEP    Baseline Iniitated.    Time 3    Period Weeks    Status New    Target Date 01/10/22               PT Long Term Goals - 12/13/21 1924       PT LONG TERM GOAL #1   Title I with final HEP.    Time 9    Period Weeks    Status New     Target Date 02/14/22      PT LONG TERM GOAL #2   Title Patient will maintain SATS >89% at rest and during all activity.    Baseline <88% at rest    Time 9    Period Weeks    Status New    Target  Date 02/14/22      PT LONG TERM GOAL #3   Title Patient will walk at least 15 minutes without having to stop, maintianing SATS >90%    Baseline 5.15 minutes    Time 9    Period Weeks    Status New    Target Date 02/14/22      PT LONG TERM GOAL #4   Title Patient will perform all daily functional activities with back pain < 3/10.    Time 9    Period Weeks    Status New    Target Date 02/14/22                   Plan - 01/01/22 1714     Clinical Impression Statement Patient has seen the NP at his cardiologist, who reported that his resting SATS did not drop below 92% in the office. However, they once again droped into the high 80's at times today. Improved to 90% with deep breathing. Initiated HEP and he tolerated that well, emphasizing breathing throughout, SATS remained >/= 92%.    Personal Factors and Comorbidities Fitness;Past/Current Experience;Comorbidity 2    Comorbidities L ventricular hypertrophy, chronic desaturation.    Examination-Activity Limitations Locomotion Level;Reach Overhead;Squat;Carry;Lift;Stairs;Stand    Examination-Participation Restrictions Cleaning;Occupation;Yard Work    Merchant [employer] officer Evolving/Moderate complexity    Clinical Decision Making Moderate    Rehab Potential Good    PT Frequency 2x / week    PT Duration Other (comment)   9w   PT Treatment/Interventions ADLs/Self Care Home Management;Electrical Stimulation;Neuromuscular re-education;Manual techniques;Energy conservation;Balance training;Therapeutic exercise;Therapeutic activities;Functional mobility training;Stair training;Gait training;Patient/family education    PT Next Visit Plan HEP    PT Home Exercise Plan [email or web address]    Consulted and Agree with Plan of Care Patient              Patient will benefit from skilled therapeutic intervention in order to improve the following deficits and impairments:  Difficulty walking, Cardiopulmonary status limiting activity, Decreased endurance, Decreased activity tolerance, Pain, Postural dysfunction, Decreased strength, Decreased mobility, Decreased balance, Improper body mechanics  Visit Diagnosis: Difficulty in walking, not elsewhere classified  Muscle weakness (generalized)     Problem List Patient Active Problem List   Diagnosis Date Noted   Asthma 12/26/2021   Celiac artery dilatation ([place]) 12/17/2021   CAP (community acquired pneumonia) 10/13/2021   Dyspnea    Gastrointestinal hemorrhage    Acute respiratory failure with hypoxia ([place]) 10/07/2021   ABLA (acute blood loss anemia) 10/07/2021   History of pulmonary embolus (PE) 10/07/2021   AF (paroxysmal atrial fibrillation) ([place]) 10/07/2021   VT (ventricular tachycardia) 09/08/2021   Hypertrophic cardiomyopathy ([place]) 08/15/2021   Ventricular tachycardia 08/02/2021   Acute on chronic respiratory failure ([place]) 06/07/2021   History of COVID-19 06/07/2021   Rheumatoid arthritis involving multiple sites on Humira ([place]) 06/07/2021   On chronic prednisone therapy for Rheumatoid Arthritis 06/07/2021   CAD (coronary artery disease), non-obstructive 05/30/2021   Elevated troponin    Precordial chest pain    Heart failure with mid-range ejection fraction ([place]) 05/27/2021   LVH (left ventricular hypertrophy) due to hypertensive disease, with heart failure ([place]) 05/27/2021   Class 2 severe obesity with serious comorbidity and body mass index (BMI) of 35.0 to 35.9 in adult [facility]) 05/26/2021   Pulmonary embolism ([place]) 05/25/2021   Dyspnea on exertion    Thoracic aortic aneurysm without rupture 02/01/2021   Ascending aortic aneurysm 02/01/2021   Other hyperlipidemia 10/14/2020   Testicular swelling  05/14/2019   Pain in right knee 01/29/2019   PVC's (premature  ventricular contractions)    PVC (premature ventricular contraction) 10/07/2018   Dilated aortic root (HCC)    Allergic rhinitis 01/10/2016   Chronic gout without tophus 01/10/2016   Drug therapy 01/10/2016   OSA (obstructive sleep apnea) 01/10/2016   Colonic polyp 12/14/2015   Rt groin pain 12/14/2015   Scoliosis of thoracolumbar spine 12/14/2015   Right lower quadrant abdominal pain 04/20/2015   Arthritis of knee 02/22/2015   Edema of extremities 02/22/2015   Essential hypertension 02/22/2015   Preventative health care 02/22/2015   Bradycardia by electrocardiogram 04/16/2014    Marcelina Morel, DPT 01/01/2022, 5:17 PM  [place]. [place], [place], [address] Phone: [phone]   Fax:  [phone]  Name: Ryan Glass MRN: [id number] Date of Birth: August 24, 1956

## 2022-01-01 NOTE — Patient Instructions (Signed)
Access Code: [email or web address] URL: [email or web address] Date: 01/01/2022 Prepared by: Oley Balm  Exercises Supine Bridge - 1 x daily - 7 x weekly - 2 sets - 10 reps Hooklying Clamshell with Resistance - 1 x daily - 7 x weekly - 2 sets - 10 reps Supine March with Resistance Band - 1 x daily - 7 x weekly - 2 sets - 10 reps Supine Straight Leg Raises - 1 x daily - 7 x weekly - 2 sets - 10 reps Straight Leg Raise with External Rotation - 1 x daily - 7 x weekly - 2 sets - 10 reps

## 2022-01-03 ENCOUNTER — Other Ambulatory Visit: Payer: Self-pay

## 2022-01-03 ENCOUNTER — Ambulatory Visit: Payer: No Typology Code available for payment source | Admitting: Physical Therapy

## 2022-01-03 ENCOUNTER — Encounter: Payer: Self-pay | Admitting: Physical Therapy

## 2022-01-03 DIAGNOSIS — R262 Difficulty in walking, not elsewhere classified: Secondary | ICD-10-CM

## 2022-01-03 DIAGNOSIS — M6281 Muscle weakness (generalized): Secondary | ICD-10-CM

## 2022-01-03 NOTE — Telephone Encounter (Signed)
Patient saw [place] cardiology for initial visit 12/28/2021 has follow up echo and appt in 1 month.  Dr Corlis Leak also would like him to see [facility] to ensure well controlled on sotalol.  Dr Corlis Leak also told him to stop metoprolol since he is on sotalol.  Patient reported had cardiac PT Monday and today.  Knees really sore unsure if related to therapy or the cold weather.  Noted by his therapist that oxygen still dropping into the 80s at time.  She has had him performing deep breaths and typically his shortness of breath and sp02 levels improve.  Reviewed PT therapy note and on 1/23 noted sp02s dropping into 80s during PT session per his provider. Has not started new inhaler yet tried to pick up Friday 1/20 but could not find trelegy ellipta manufacturer coupon in his email so did not pick up.  Will have RN [name] resend to him again tomorrow.  Patient still using his fluticasone salmeterol at this time.  Stated swelling in his chest around AICD improved.  Reviewed Dr Corlis Leak office note and did not mention his previous covid infection.  Discussed with patient to ensure Dr Corlis Leak notified he was hospitalized for covid infection.  Discussed with patient covid/PE/PFO/hypertersion/aneurysm/CHF/OSA/hypertrophic cardiomyopathy/CAD/PVCs can all have an effect on his symptoms/cardiac/pulmonary function/exercise (exertion) tolerance.  He is aware his case is complex.  He stated he felt listened to and his questions answered at consult.  He has remembered when he was younger and playing a lot of soccer he would sometimes get short of breath.  He is now wondering if that was related to PFO.  Discussed with patient  also if after starting new inhaler he feels better/less symptoms would know if shortness of breath related to lungs or heart symptoms also.   Discussed I would follow up with patient tomorrow in [facility] since his knees bothering him to listen to lungs/check leg/chest swelling/sp02/BP.  RN [name] will contact him tomorrow  regarding manufacturer coupon/resending again.  Patient A&Ox3 spoke full sentences without difficulty during 19 minute telephone call.  Patient verbalized understanding information/instructions, agreed with plan of care and had no further questions at this time.

## 2022-01-03 NOTE — Therapy (Signed)
[place]. [place], [place], [address] Phone: [phone]   Fax:  [phone]  Physical Therapy Treatment  Patient Details  Name: Ryan Glass MRN: [id number] Date of Birth: 11/12/1956 Referring Provider (PT): Sherren Mocha   Encounter Date: 01/03/2022   PT End of Session - 01/03/22 1750     Visit Number 3    Number of Visits 18    Date for PT Re-Evaluation 02/14/22    PT Start Time [id number]    PT Stop Time 1756    PT Time Calculation (min) 38 min    Activity Tolerance Patient limited by fatigue;Patient tolerated treatment well;Other (comment)   low SATS with rest.   Behavior During Therapy [facility] for tasks assessed/performed             Past Medical History:  Diagnosis Date   Ascending aortic aneurysm    a. CT 05/2021 showed 4.4x4.4cm TAA. Stable on chest CTA 12/2021 at 4.4cm.   Bilateral lower extremity edema    Celiac artery dilatation (HCC)    1.8cm at the celiac trunk on Chest CT 12/2021   Chronic gout without tophus    followed by dr Gerilyn Nestle    (06-08-2020  per pt last episode right knee 3 wks ago)   CKD (chronic kidney disease), stage II    nephrology--- Jen Mow PA (10-29-2019 note in epic scanned in  media)   Heart failure with mid-range ejection fraction ([place]) 05/27/2021   05/26/21: Left ventricular ejection fraction, by estimation, is 45 to 50%. There is severe asymmetric left ventricular hypertrophy. G1DD present.    History of COVID-19 06/07/2021   HOCM (hypertrophic obstructive cardiomyopathy) ([place])    s/p ICD 07/2021   Hypertension    followed by cardiology, dr t. turner   (05-14-2018 nuclear study in epic , normal perfusion with nuclear ef 61%)   Left hydrocele    Low serum potassium level 04/27/2014   OSA on CPAP    per pt uses every night   Palpitations followed by dr t. [name]   06-08-2020  still feels palipations due to PVCs when exertion but not with chest pain/ discomfort    Pneumonia due to COVID-19 virus    PVC's (premature ventricular contractions) cardiologist--- dr t. turner   Status post PVC ablation by [name] 2019 with recurrence of frequent PVCs/bigeminy;  prior pseudobradycardia r/t pvcs   Rheumatoid arthritis involving multiple sites [facility])    rheumotology--- dr a. Gerilyn Nestle  ([facility] [place])    Past Surgical History:  Procedure Laterality Date   BIOPSY  10/08/2021   Procedure: BIOPSY;  Surgeon: Daryel November, MD;  Location: [place];  Service: Gastroenterology;;   Kathleen Argue STUDY  08/18/2021   Procedure: BUBBLE STUDY;  Surgeon: Josue Hector, MD;  Location: [facility] ENDOSCOPY;  Service: Cardiovascular;;   ESOPHAGOGASTRODUODENOSCOPY N/A 10/08/2021   Procedure: ESOPHAGOGASTRODUODENOSCOPY (EGD);  Surgeon: Daryel November, MD;  Location: [place];  Service: Gastroenterology;  Laterality: N/A;   HYDROCELE EXCISION Left 06/14/2020   Procedure: LEFT  HYDROCELECTOMY ADULT;  Surgeon: Robley Fries, MD;  Location: [facility];  Service: Urology;  Laterality: Left;   ICD IMPLANT N/A 08/07/2021   Procedure: ICD IMPLANT;  Surgeon: Evans Lance, MD;  Location: [place] CV LAB;  Service: Cardiovascular;  Laterality: N/A;   INCISIONAL HERNIA REPAIR  02-23-2016   @[facility]    LAPAROSCOPIC   LAPAROSCOPIC INGUINAL HERNIA REPAIR Bilateral 08-22-2015  @[facility]   AND UMBILICAL HERNIA REPAIR   PVC ABLATION N/A 10/07/2018   Procedure: PVC ABLATION;  Surgeon: Evans Lance, MD;  Location: [place] CV LAB;  Service: Cardiovascular;  Laterality: N/A;   RIGHT/LEFT HEART CATH AND CORONARY ANGIOGRAPHY N/A 05/29/2021   Procedure: RIGHT/LEFT HEART CATH AND CORONARY ANGIOGRAPHY;  Surgeon: Burnell Blanks, MD;  Location: [place] CV LAB;  Service: Cardiovascular;  Laterality: N/A;   TEE WITHOUT CARDIOVERSION N/A 08/18/2021   Procedure: TRANSESOPHAGEAL ECHOCARDIOGRAM (TEE);  Surgeon: Josue Hector, MD;  Location: [facility] ENDOSCOPY;  Service:  Cardiovascular;  Laterality: N/A;   UMBILICAL HERNIA REPAIR  child    There were no vitals filed for this visit.   Subjective Assessment - 01/03/22 1723     Subjective Pateint reports that his knees were sore yesterday. He is not sure if it was from the exercises or due to the weather. His knees feel better today.    Pertinent History RA, L ventricular hypertrophy, chronic desaturation, PVC with ICD implant 8/22,    Currently in Pain? No/denies                               [facility] Adult PT Treatment/Exercise - 01/03/22 0001       Exercises   Exercises Shoulder      Knee/Hip Exercises: Aerobic   Nustep L4 x 6 minutes      Knee/Hip Exercises: Machines for Strengthening   Cybex Knee Flexion 15#, 2 x 10, for knee flexion ROM for pain reduction.      Shoulder Exercises: ROM/Strengthening   Lat Pull 20 reps    Lat Pull Limitations 25#    Cybex Press 20 reps    Cybex Press Limitations 15#    Cybex Row 20 reps    Cybex Row Limitations 15#                       PT Short Term Goals - 01/03/22 1733       PT SHORT TERM GOAL #1   Title I with basic HEP               PT Long Term Goals - 12/13/21 1924       PT LONG TERM GOAL #1   Title I with final HEP.    Time 9    Period Weeks    Status New    Target Date 02/14/22      PT LONG TERM GOAL #2   Title Patient will maintain SATS >89% at rest and during all activity.    Baseline <88% at rest    Time 9    Period Weeks    Status New    Target Date 02/14/22      PT LONG TERM GOAL #3   Title Patient will walk at least 15 minutes without having to stop, maintianing SATS >90%    Baseline 5.15 minutes    Time 9    Period Weeks    Status New    Target Date 02/14/22      PT LONG TERM GOAL #4   Title Patient will perform all daily functional activities with back pain < 3/10.    Time 9    Period Weeks    Status New    Target Date 02/14/22                   Plan - 01/03/22  1745     Clinical Impression Statement Patient reports legs were sore yesterday, not sure it was due to weather or workout, but they felt better today. Treatment emphasized upper body and trunk strength, and also performed mainly active ROM with minimal resistance for B knees. he also noted a nodule on his wrist flexor tendon. Therapsit provided tendon gliding exercises as HEP. SATS fluctuated a lot today, moving up and down as much as 6% within 3 deep breaths.    Personal Factors and Comorbidities Fitness;Past/Current Experience;Comorbidity 2    Comorbidities L ventricular hypertrophy, chronic desaturation.    Examination-Activity Limitations Locomotion Level;Reach Overhead;Squat;Carry;Lift;Stairs;Stand    Examination-Participation Restrictions Cleaning;Occupation;Yard Work    Merchant [employer] officer Evolving/Moderate complexity    Clinical Decision Making Moderate    Rehab Potential Good    PT Frequency 2x / week    PT Duration Other (comment)   9w   PT Treatment/Interventions ADLs/Self Care Home Management;Electrical Stimulation;Neuromuscular re-education;Manual techniques;Energy conservation;Balance training;Therapeutic exercise;Therapeutic activities;Functional mobility training;Stair training;Gait training;Patient/family education    PT Home Exercise Plan Tendon glide handout for hands.    Consulted and Agree with Plan of Care Patient             Patient will benefit from skilled therapeutic intervention in order to improve the following deficits and impairments:  Difficulty walking, Cardiopulmonary status limiting activity, Decreased endurance, Decreased activity tolerance, Pain, Postural dysfunction, Decreased strength, Decreased mobility, Decreased balance, Improper body mechanics  Visit Diagnosis: Difficulty in walking, not elsewhere classified  Muscle weakness (generalized)     Problem List Patient Active Problem List   Diagnosis Date Noted   Asthma 12/26/2021    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   CAP (community acquired pneumonia) 10/13/2021   Dyspnea    Gastrointestinal hemorrhage    Acute respiratory failure with hypoxia ([place]) 10/07/2021   ABLA (acute blood loss anemia) 10/07/2021   History of pulmonary embolus (PE) 10/07/2021   AF (paroxysmal atrial fibrillation) ([place]) 10/07/2021   VT (ventricular tachycardia) 09/08/2021   Hypertrophic cardiomyopathy ([place]) 08/15/2021   Ventricular tachycardia 08/02/2021   Acute on chronic respiratory failure ([place]) 06/07/2021   History of COVID-19 06/07/2021   Rheumatoid arthritis involving multiple sites on Humira ([place]) 06/07/2021   On chronic prednisone therapy for Rheumatoid Arthritis 06/07/2021   CAD (coronary artery disease), non-obstructive 05/30/2021   Elevated troponin    Precordial chest pain    Heart failure with mid-range ejection fraction ([place]) 05/27/2021   LVH (left ventricular hypertrophy) due to hypertensive disease, with heart failure ([place]) 05/27/2021   Class 2 severe obesity with serious comorbidity and body mass index (BMI) of 35.0 to 35.9 in adult ([place]) 05/26/2021   Pulmonary embolism ([place]) 05/25/2021   Dyspnea on exertion    Thoracic aortic aneurysm without rupture 02/01/2021   Ascending aortic aneurysm 02/01/2021   Other hyperlipidemia 10/14/2020   Testicular swelling 05/14/2019   Pain in right knee 01/29/2019   PVC's (premature ventricular contractions)    PVC (premature ventricular contraction) 10/07/2018   Dilated aortic root (HCC)    Allergic rhinitis 01/10/2016   Chronic gout without tophus 01/10/2016   Drug therapy 01/10/2016   OSA (obstructive sleep apnea) 01/10/2016   Colonic polyp 12/14/2015   Rt groin pain 12/14/2015   Scoliosis of thoracolumbar spine 12/14/2015   Right lower quadrant abdominal pain 04/20/2015   Arthritis of knee 02/22/2015   Edema of extremities 02/22/2015   Essential hypertension 02/22/2015   Preventative health care 02/22/2015    Bradycardia by electrocardiogram  04/16/2014    Marcelina Morel, DPT 01/03/2022, 5:56 PM  [place]. [place], [place], [address] Phone: [phone]   Fax:  [phone]  Name: Ryan Glass MRN: [id number] Date of Birth: [birth year]

## 2022-01-03 NOTE — Telephone Encounter (Signed)
Patient seen in office 12/12/21 see office note.

## 2022-01-04 ENCOUNTER — Ambulatory Visit: Payer: Self-pay | Admitting: Registered Nurse

## 2022-01-04 ENCOUNTER — Encounter: Payer: Self-pay | Admitting: Registered Nurse

## 2022-01-04 VITALS — BP 149/95 | HR 80

## 2022-01-04 DIAGNOSIS — J4521 Mild intermittent asthma with (acute) exacerbation: Secondary | ICD-10-CM

## 2022-01-04 DIAGNOSIS — E6609 Other obesity due to excess calories: Secondary | ICD-10-CM

## 2022-01-04 DIAGNOSIS — I10 Essential (primary) hypertension: Secondary | ICD-10-CM

## 2022-01-04 DIAGNOSIS — Z6834 Body mass index (BMI) 34.0-34.9, adult: Secondary | ICD-10-CM

## 2022-01-04 NOTE — Patient Instructions (Addendum)
Pick up Trelegy Ellipta inhaler at pharmacy and start use 1 puff daily  Use incentive spirometer daily and as needed when sp02 less than 90  Use albuterol inhaler 2 puffs by mouth every 4 hours as needed low sp02 reading (less than 90), short of breath or wheezing  Continue physical therapy/cardiac rehab appts  Keep follow up with [facility] cardiologist in 1 month  Hypoxia Hypoxia is a condition that happens when there is a lack of oxygen in the body's tissues and organs. When there is not enough oxygen, organs cannot work as they should. This causes serious problems throughout the body and in the brain. What are the causes? This condition may be caused by: Exposure to high altitude. A collapsed lung (pneumothorax). Lung infection (pneumonia). Lung injury. Long-term (chronic) lung disease, such as chronic obstructive pulmonary disease (COPD) or emphysema. Fluid collecting in the chest cavity (congestive heart failure), or blood collecting in the chest cavity (hemothorax). Food, saliva, or vomit getting into the airway (aspiration). Reduced blood flow (ischemia). Severe blood loss. Slow or shallow breathing (hypoventilation). Blood disorders, such as anemia. Carbon monoxide or cyanide poisoning. The heart suddenly stopping (cardiac arrest). Medicines or recreational drugs with severe sedating effects. Drowning. Choking. What are the signs or symptoms? Symptoms of this condition include: Headache. Feeling tired (fatigue). Forgetfulness. Nausea. Confusion. Shortness of breath. Dizziness. Bluish color of the skin, lips, or nail beds (cyanosis). Change in consciousness or awareness. If hypoxia is not treated, it can lead to convulsions, loss of consciousness (coma), or brain damage, which can be life-threatening. How is this diagnosed? This condition may be diagnosed based on: A physical exam. Blood tests. A test that measures how much oxygen is in your blood (pulse oximetry).  This is done with a sensor that is placed on your finger, toe, or earlobe. Imaging, such as a chest X-ray or CT scan. Tests to check your lung function (pulmonary function tests). A test to check the electrical activity of your heart (electrocardiogram, ECG). You may have other tests to determine the cause of your hypoxia. How is this treated? Treatment for this condition depends on what is causing the hypoxia. You will likely be treated with oxygen therapy. This may be done by giving you oxygen through a face mask or through tubes in your nose. Your health care provider may also recommend other therapies to treat the underlying cause of your hypoxia. Follow these instructions at home: Take over-the-counter and prescription medicines only as told by your health care provider. Do not use any products that contain nicotine or tobacco. These products include cigarettes, chewing tobacco, and vaping devices, such as e-cigarettes. If you need help quitting, ask your health care provider. Avoid secondhand smoke. Work with your health care provider to manage any chronic conditions you have that may be causing hypoxia, such as COPD. Keep all follow-up visits. This is important. Contact a health care provider if: You have a fever. You become extremely short of breath when you exercise. Get help right away if: Your shortness of breath gets worse, especially with normal or very little activity. You have trouble breathing, even after treatment. Your skin, lips, or nail beds have a bluish color. You become confused or you cannot think properly. You have chest pain. These symptoms may be an emergency. Get help right away. Call 911. Do not wait to see if the symptoms will go away. Do not drive yourself to the hospital. Summary Hypoxia is a condition that happens when there is  a lack of oxygen in the body's tissues and organs. If hypoxia is not treated, it can lead to convulsions, loss of consciousness  (coma), or brain damage. Symptoms of hypoxia can include a headache, shortness of breath, confusion, nausea, and a bluish skin color. Hypoxia has many possible causes, including exposure to high altitude, carbon monoxide poisoning, or other health issues, such as blood disorders or cardiac arrest. Hypoxia is usually treated with oxygen therapy. This information is not intended to replace advice given to you by your health care provider. Make sure you discuss any questions you have with your health care provider. Document Revised: 06/27/2021 Document Reviewed: 06/27/2021 Elsevier Patient Education  2022 [place] Your Hypertension Hypertension, also called high blood pressure, is when the force of the blood pressing against the walls of the arteries is too strong. Arteries are blood vessels that carry blood from your heart throughout your body. Hypertension forces the heart to work harder to pump blood and may cause the arteries to become narrow or stiff. Understanding blood pressure readings Your personal target blood pressure may vary depending on your medical conditions, your age, and other factors. A blood pressure reading includes a higher number over a lower number. Ideally, your blood pressure should be below 120/80. You should know that: The first, or top, number is called the systolic pressure. It is a measure of the pressure in your arteries as your heart beats. The second, or bottom number, is called the diastolic pressure. It is a measure of the pressure in your arteries as the heart relaxes. Blood pressure is classified into four stages. Based on your blood pressure reading, your health care provider may use the following stages to determine what type of treatment you need, if any. Systolic pressure and diastolic pressure are measured in a unit called mmHg. Normal Systolic pressure: below 123456. Diastolic pressure: below 80. Elevated Systolic pressure: [id number]. Diastolic  pressure: below 80. Hypertension stage 1 Systolic pressure: 0000000. Diastolic pressure: [id number]. Hypertension stage 2 Systolic pressure: [id number] or above. Diastolic pressure: 90 or above. How can this condition affect me? Managing your hypertension is an important responsibility. Over time, hypertension can damage the arteries and decrease blood flow to important parts of the body, including the brain, heart, and kidneys. Having untreated or uncontrolled hypertension can lead to: A heart attack. A stroke. A weakened blood vessel (aneurysm). Heart failure. Kidney damage. Eye damage. Metabolic syndrome. Memory and concentration problems. Vascular dementia. What actions can I take to manage this condition? Hypertension can be managed by making lifestyle changes and possibly by taking medicines. Your health care provider will help you make a plan to bring your blood pressure within a normal range. Nutrition  Eat a diet that is high in fiber and potassium, and low in salt (sodium), added sugar, and fat. An example eating plan is called the Dietary Approaches to Stop Hypertension (DASH) diet. To eat this way: Eat plenty of fresh fruits and vegetables. Try to fill one-half of your plate at each meal with fruits and vegetables. Eat whole grains, such as whole-wheat pasta, brown rice, or whole-grain bread. Fill about one-fourth of your plate with whole grains. Eat low-fat dairy products. Avoid fatty cuts of meat, processed or cured meats, and poultry with skin. Fill about one-fourth of your plate with lean proteins such as fish, chicken without skin, beans, eggs, and tofu. Avoid pre-made and processed foods. These tend to be higher in sodium, added sugar, and fat. Reduce your daily sodium  intake. Most people with hypertension should eat less than 1,500 mg of sodium a day. Lifestyle  Work with your health care provider to maintain a healthy body weight or to lose weight. Ask what an ideal weight is  for you. Get at least 30 minutes of exercise that causes your heart to beat faster (aerobic exercise) most days of the week. Activities may include walking, swimming, or biking. Include exercise to strengthen your muscles (resistance exercise), such as weight lifting, as part of your weekly exercise routine. Try to do these types of exercises for 30 minutes at least 3 days a week. Do not use any products that contain nicotine or tobacco, such as cigarettes, e-cigarettes, and chewing tobacco. If you need help quitting, ask your health care provider. Control any long-term (chronic) conditions you have, such as high cholesterol or diabetes. Identify your sources of stress and find ways to manage stress. This may include meditation, deep breathing, or making time for fun activities. Alcohol use Do not drink alcohol if: Your health care provider tells you not to drink. You are pregnant, may be pregnant, or are planning to become pregnant. If you drink alcohol: Limit how much you use to: 0-1 drink a day for women. 0-2 drinks a day for men. Be aware of how much alcohol is in your drink. In the [place] one drink equals one 12 oz bottle of beer (355 mL), one 5 oz glass of wine (148 mL), or one 1 oz glass of hard liquor (44 mL). Medicines Your health care provider may prescribe medicine if lifestyle changes are not enough to get your blood pressure under control and if: Your systolic blood pressure is 130 or higher. Your diastolic blood pressure is 80 or higher. Take medicines only as told by your health care provider. Follow the directions carefully. Blood pressure medicines must be taken as told by your health care provider. The medicine does not work as well when you skip doses. Skipping doses also puts you at risk for problems. Monitoring Before you monitor your blood pressure: Do not smoke, drink caffeinated beverages, or exercise within 30 minutes before taking a measurement. Use the bathroom and  empty your bladder (urinate). Sit quietly for at least 5 minutes before taking measurements. Monitor your blood pressure at home as told by your health care provider. To do this: Sit with your back straight and supported. Place your feet flat on the floor. Do not cross your legs. Support your arm on a flat surface, such as a table. Make sure your upper arm is at heart level. Each time you measure, take two or three readings one minute apart and record the results. You may also need to have your blood pressure checked regularly by your health care provider. General information Talk with your health care provider about your diet, exercise habits, and other lifestyle factors that may be contributing to hypertension. Review all the medicines you take with your health care provider because there may be side effects or interactions. Keep all visits as told by your health care provider. Your health care provider can help you create and adjust your plan for managing your high blood pressure. Where to find more information National Heart, Lung, and Blood Institute: https://wilson-eaton.com/ American Heart Association: www.heart.org Contact a health care provider if: You think you are having a reaction to medicines you have taken. You have repeated (recurrent) headaches. You feel dizzy. You have swelling in your ankles. You have trouble with your vision. Get help  right away if: You develop a severe headache or confusion. You have unusual weakness or numbness, or you feel faint. You have severe pain in your chest or abdomen. You vomit repeatedly. You have trouble breathing. These symptoms may represent a serious problem that is an emergency. Do not wait to see if the symptoms will go away. Get medical help right away. Call your local emergency services (911 in the [place] Do not drive yourself to the hospital. Summary Hypertension is when the force of blood pumping through your arteries is too strong. If  this condition is not controlled, it may put you at risk for serious complications. Your personal target blood pressure may vary depending on your medical conditions, your age, and other factors. For most people, a normal blood pressure is less than 120/80. Hypertension is managed by lifestyle changes, medicines, or both. Lifestyle changes to help manage hypertension include losing weight, eating a healthy, low-sodium diet, exercising more, stopping smoking, and limiting alcohol. This information is not intended to replace advice given to you by your health care provider. Make sure you discuss any questions you have with your health care provider. Document Revised: 12/14/2019 Document Reviewed: 10/27/2019 Elsevier Patient Education  2022 [place] Eating Plan DASH stands for Dietary Approaches to Stop Hypertension. The DASH eating plan is a healthy eating plan that has been shown to: Reduce high blood pressure (hypertension). Reduce your risk for type 2 diabetes, heart disease, and stroke. Help with weight loss. What are tips for following this plan? Reading food labels Check food labels for the amount of salt (sodium) per serving. Choose foods with less than 5 percent of the Daily Value of sodium. Generally, foods with less than 300 milligrams (mg) of sodium per serving fit into this eating plan. To find whole grains, look for the word "whole" as the first word in the ingredient list. Shopping Buy products labeled as "low-sodium" or "no salt added." Buy fresh foods. Avoid canned foods and pre-made or frozen meals. Cooking Avoid adding salt when cooking. Use salt-free seasonings or herbs instead of table salt or sea salt. Check with your health care provider or pharmacist before using salt substitutes. Do not fry foods. Cook foods using healthy methods such as baking, boiling, grilling, roasting, and broiling instead. Cook with heart-healthy oils, such as olive, canola, avocado,  soybean, or sunflower oil. Meal planning  Eat a balanced diet that includes: 4 or more servings of fruits and 4 or more servings of vegetables each day. Try to fill one-half of your plate with fruits and vegetables. 6-8 servings of whole grains each day. Less than 6 oz (170 g) of lean meat, poultry, or fish each day. A 3-oz (85-g) serving of meat is about the same size as a deck of cards. One egg equals 1 oz (28 g). 2-3 servings of low-fat dairy each day. One serving is 1 cup (237 mL). 1 serving of nuts, seeds, or beans 5 times each week. 2-3 servings of heart-healthy fats. Healthy fats called omega-3 fatty acids are found in foods such as walnuts, flaxseeds, fortified milks, and eggs. These fats are also found in cold-water fish, such as sardines, salmon, and mackerel. Limit how much you eat of: Canned or prepackaged foods. Food that is high in trans fat, such as some fried foods. Food that is high in saturated fat, such as fatty meat. Desserts and other sweets, sugary drinks, and other foods with added sugar. Full-fat dairy products. Do not salt foods before  eating. Do not eat more than 4 egg yolks a week. Try to eat at least 2 vegetarian meals a week. Eat more home-cooked food and less restaurant, buffet, and fast food. Lifestyle When eating at a restaurant, ask that your food be prepared with less salt or no salt, if possible. If you drink alcohol: Limit how much you use to: 0-1 drink a day for women who are not pregnant. 0-2 drinks a day for men. Be aware of how much alcohol is in your drink. In the [place] one drink equals one 12 oz bottle of beer (355 mL), one 5 oz glass of wine (148 mL), or one 1 oz glass of hard liquor (44 mL). General information Avoid eating more than 2,300 mg of salt a day. If you have hypertension, you may need to reduce your sodium intake to 1,500 mg a day. Work with your health care provider to maintain a healthy body weight or to lose weight. Ask what an  ideal weight is for you. Get at least 30 minutes of exercise that causes your heart to beat faster (aerobic exercise) most days of the week. Activities may include walking, swimming, or biking. Work with your health care provider or dietitian to adjust your eating plan to your individual calorie needs. What foods should I eat? Fruits All fresh, dried, or frozen fruit. Canned fruit in natural juice (without added sugar). Vegetables Fresh or frozen vegetables (raw, steamed, roasted, or grilled). Low-sodium or reduced-sodium tomato and vegetable juice. Low-sodium or reduced-sodium tomato sauce and tomato paste. Low-sodium or reduced-sodium canned vegetables. Grains Whole-grain or whole-wheat bread. Whole-grain or whole-wheat pasta. Brown rice. [place]. Bulgur. Whole-grain and low-sodium cereals. Pita bread. Low-fat, low-sodium crackers. Whole-wheat flour tortillas. Meats and other proteins Skinless chicken or [place]. Ground chicken or [place]. Pork with fat trimmed off. Fish and seafood. Egg whites. Dried beans, peas, or lentils. Unsalted nuts, nut butters, and seeds. Unsalted canned beans. Lean cuts of beef with fat trimmed off. Low-sodium, lean precooked or cured meat, such as sausages or meat loaves. Dairy Low-fat (1%) or fat-free (skim) milk. Reduced-fat, low-fat, or fat-free cheeses. Nonfat, low-sodium ricotta or cottage cheese. Low-fat or nonfat yogurt. Low-fat, low-sodium cheese. Fats and oils Soft margarine without trans fats. Vegetable oil. Reduced-fat, low-fat, or light mayonnaise and salad dressings (reduced-sodium). Canola, safflower, olive, avocado, soybean, and sunflower oils. Avocado. Seasonings and condiments Herbs. Spices. Seasoning mixes without salt. Other foods Unsalted popcorn and pretzels. Fat-free sweets. The items listed above may not be a complete list of foods and beverages you can eat. Contact a dietitian for more information. What foods should I  avoid? Fruits Canned fruit in a light or heavy syrup. Fried fruit. Fruit in cream or butter sauce. Vegetables Creamed or fried vegetables. Vegetables in a cheese sauce. Regular canned vegetables (not low-sodium or reduced-sodium). Regular canned tomato sauce and paste (not low-sodium or reduced-sodium). Regular tomato and vegetable juice (not low-sodium or reduced-sodium). Angie Fava. Olives. Grains Baked goods made with fat, such as croissants, muffins, or some breads. Dry pasta or rice meal packs. Meats and other proteins Fatty cuts of meat. Ribs. Fried meat. Berniece Salines. Bologna, salami, and other precooked or cured meats, such as sausages or meat loaves. Fat from the back of a pig (fatback). Bratwurst. Salted nuts and seeds. Canned beans with added salt. Canned or smoked fish. Whole eggs or egg yolks. Chicken or [place] with skin. Dairy Whole or 2% milk, cream, and half-and-half. Whole or full-fat cream cheese. Whole-fat or sweetened yogurt.  Full-fat cheese. Nondairy creamers. Whipped toppings. Processed cheese and cheese spreads. Fats and oils Butter. Stick margarine. Lard. Shortening. Ghee. Bacon fat. Tropical oils, such as coconut, palm kernel, or palm oil. Seasonings and condiments Onion salt, garlic salt, seasoned salt, table salt, and sea salt. Worcestershire sauce. Tartar sauce. Barbecue sauce. Teriyaki sauce. Soy sauce, including reduced-sodium. Steak sauce. Canned and packaged gravies. Fish sauce. Oyster sauce. Cocktail sauce. Store-bought horseradish. Ketchup. Mustard. Meat flavorings and tenderizers. Bouillon cubes. Hot sauces. Pre-made or packaged marinades. Pre-made or packaged taco seasonings. Relishes. Regular salad dressings. Other foods Salted popcorn and pretzels. The items listed above may not be a complete list of foods and beverages you should avoid. Contact a dietitian for more information. Where to find more information National Heart, Lung, and Blood Institute:  https://wilson-eaton.com/ American Heart Association: www.heart.org Academy of Nutrition and Dietetics: www.eatright.[place]: www.kidney.org Summary The DASH eating plan is a healthy eating plan that has been shown to reduce high blood pressure (hypertension). It may also reduce your risk for type 2 diabetes, heart disease, and stroke. When on the DASH eating plan, aim to eat more fresh fruits and vegetables, whole grains, lean proteins, low-fat dairy, and heart-healthy fats. With the DASH eating plan, you should limit salt (sodium) intake to 2,300 mg a day. If you have hypertension, you may need to reduce your sodium intake to 1,500 mg a day. Work with your health care provider or dietitian to adjust your eating plan to your individual calorie needs. This information is not intended to replace advice given to you by your health care provider. Make sure you discuss any questions you have with your health care provider. Document Revised: 10/30/2019 Document Reviewed: 10/30/2019 Elsevier Patient Education  2022 [employer].

## 2022-01-04 NOTE — Progress Notes (Signed)
Subjective:    Patient ID: Ryan Glass, male    DOB: 10/30/1956, 66 y.o.   MRN: [id number]  65y/o [place] married male established patient follow up blood pressure after stopping metoprolol per [facility] cardiology on 29 Dec 2021.  Patient has not yet picked up trelegy ellipta inhaler due to copay $100 and couldn't find manufacturer coupon to help with cost in his email.  Has still been using his salmeterol-fluticasone inhaler 1 puff twice a day.   Patient reported when at rest home/PT sp02 RA dropping into 80s.  Typically if active 90s.  Has not been using incentive spirometer or albuterol inhaler.  Has not felt short of breath/wheezing.  Denied cough/fever/sinus infection/URI.  Has been taking singulair and claritin daily 10mg  along with flonase and nasal saline.  Patient seen in workcenter due to knees hurting a great deal this week.  Unsure if due to physical therapy appts/cold weather or combination.  Weight stable on home scale along with leg swelling.  Did not take lasix this am.  Swelling around AICD has almost completely resolved again.  Denied palpitations/fatigue.  Has been processing silver orders at work and new left wrist pain and bump anterior wrist noted this week.  Tylenol helping to relieve pain.  Denied hand/finger weakness/rash.  Notes if getting items out of bed of his truck with left hand pain in shoulder left but does not occur if he uses right hand.     Review of Systems  Constitutional:  Positive for activity change. Negative for appetite change, chills, diaphoresis, fatigue and fever.  HENT:  Negative for congestion, dental problem, ear discharge, ear pain, facial swelling, hearing loss, mouth sores, nosebleeds, postnasal drip, rhinorrhea, sinus pressure, sinus pain, sneezing, sore throat, tinnitus, trouble swallowing and voice change.   Eyes:  Negative for photophobia and visual disturbance.  Respiratory:  Negative for cough, choking, chest tightness, shortness of breath,  wheezing and stridor.   Cardiovascular:  Positive for leg swelling. Negative for chest pain and palpitations.  Gastrointestinal:  Negative for diarrhea, nausea and vomiting.  Endocrine: Negative for cold intolerance and heat intolerance.  Genitourinary:  Negative for difficulty urinating.  Musculoskeletal:  Positive for arthralgias, gait problem and myalgias. Negative for neck stiffness.  Skin:  Negative for rash.  Allergic/Immunologic: Positive for environmental allergies. Negative for food allergies.  Neurological:  Negative for dizziness, tremors, seizures, syncope, facial asymmetry, speech difficulty, weakness, light-headedness, numbness and headaches.  Hematological:  Negative for adenopathy. Does not bruise/bleed easily.  Psychiatric/Behavioral:  Negative for agitation, confusion and sleep disturbance.       Objective:   Physical Exam Vitals and nursing note reviewed.  Constitutional:      General: He is awake. He is not in acute distress.    Appearance: Normal appearance. He is well-developed and well-groomed. He is obese. He is not ill-appearing, toxic-appearing or diaphoretic.  HENT:     Head: Normocephalic and atraumatic.     Jaw: There is normal jaw occlusion.     Salivary Glands: Right salivary gland is not diffusely enlarged. Left salivary gland is not diffusely enlarged.     Right Ear: Hearing and external ear normal.     Left Ear: Hearing and external ear normal.     Nose: Nose normal. No congestion or rhinorrhea.     Mouth/Throat:     Lips: Pink. No lesions.     Mouth: Mucous membranes are moist. No lacerations, oral lesions or angioedema.     Dentition: Has dentures.  No gum lesions.     Tongue: No lesions. Tongue does not deviate from midline.     Palate: No mass and lesions.     Pharynx: Oropharynx is clear. Uvula midline. No uvula swelling.     Comments: Cobblestoning posterior pharynx;  Eyes:     General: Lids are normal. Vision grossly intact. Gaze aligned  appropriately. Allergic shiner present. No scleral icterus.       Right eye: No discharge.        Left eye: No discharge.     Extraocular Movements: Extraocular movements intact.     Conjunctiva/sclera: Conjunctivae normal.     Pupils: Pupils are equal, round, and reactive to light.     Comments: Bilateral allergic shiners; lower eyelid swelling nonpitting 1+/4  Neck:     Trachea: Trachea normal.  Cardiovascular:     Rate and Rhythm: Normal rate and regular rhythm.     Pulses: Normal pulses.          Radial pulses are 2+ on the right side and 2+ on the left side.     Heart sounds: Normal heart sounds, S1 normal and S2 normal. Heart sounds not distant. No murmur heard.   No friction rub. No gallop.     Comments: 0-1+/4 bilateral nonpitting edema lower extremities/ankles sock line Pulmonary:     Effort: Pulmonary effort is normal. No respiratory distress.     Breath sounds: Normal breath sounds and air entry. No stridor, decreased air movement or transmitted upper airway sounds. No decreased breath sounds, wheezing, rhonchi or rales.     Comments: Spoke full sentences without difficulty; no cough or throat clearing observed; sp02 92% when talking and decreases to 87-88% if resting/not speaking in chair; increases to 90% with pursed lip breathing at rest; once he begins talking consistently again 92-93% RA Chest:     Chest wall: Swelling present. No lacerations, tenderness, crepitus or edema.       Comments: Edges of AICD not sharp to visual inspection today Abdominal:     Palpations: Abdomen is soft.  Musculoskeletal:        General: Tenderness present. No signs of injury.     Right forearm: No swelling, edema, lacerations or tenderness.     Left forearm: No swelling, edema, lacerations or tenderness.     Right wrist: No swelling, effusion, lacerations, tenderness, bony tenderness, snuff box tenderness or crepitus. Decreased range of motion. Normal pulse.     Left wrist: Swelling and  tenderness present. No effusion, lacerations, bony tenderness, snuff box tenderness or crepitus. Decreased range of motion. Normal pulse.     Right hand: No swelling or lacerations. Normal strength. Normal capillary refill.     Left hand: No swelling or lacerations. Normal strength. Normal capillary refill.     Cervical back: Normal range of motion and neck supple. No swelling, edema, deformity, erythema, signs of trauma, lacerations, rigidity, tenderness, bony tenderness or crepitus. No pain with movement.     Thoracic back: No swelling, edema, deformity, signs of trauma or lacerations.     Right lower leg: Edema present.     Left lower leg: Edema present.     Comments: Valve leaflet pronounced anterior wrist left; no bruising; localized edema 1+/4 nonpitting; negative finkelsteins left; bilateral hand grasp equal 5/5; bilateral finger and wrist AROM decreased flexion/extension  Lymphadenopathy:     Head:     Right side of head: No submandibular or preauricular adenopathy.     Left side of  head: No submandibular or preauricular adenopathy.     Cervical: No cervical adenopathy.     Right cervical: No superficial cervical adenopathy.    Left cervical: No superficial cervical adenopathy.  Skin:    General: Skin is warm and dry.     Capillary Refill: Capillary refill takes less than 2 seconds.     Coloration: Skin is not ashen, cyanotic, jaundiced, mottled, pale or sallow.     Findings: No abrasion, abscess, acne, bruising, burn, ecchymosis, erythema, signs of injury, laceration, lesion, petechiae, rash or wound.     Nails: There is no clubbing.  Neurological:     General: No focal deficit present.     Mental Status: He is alert and oriented to person, place, and time. Mental status is at baseline.     GCS: GCS eye subscore is 4. GCS verbal subscore is 5. GCS motor subscore is 6.     Cranial Nerves: No cranial nerve deficit, dysarthria or facial asymmetry.     Sensory: Sensation is intact. No  sensory deficit.     Motor: Motor function is intact. No weakness, tremor, atrophy, abnormal muscle tone or seizure activity.     Coordination: Coordination is intact. Coordination normal.     Gait: Gait is intact. Gait normal.     Comments: In/out of chair without difficulty; gait sure and steady in workcenter; bilateral hand grasp equal 5/5  Psychiatric:        Attention and Perception: Attention and perception normal.        Mood and Affect: Mood and affect normal.        Speech: Speech normal.        Behavior: Behavior normal. Behavior is cooperative.        Thought Content: Thought content normal.        Cognition and Memory: Cognition and memory normal.        Judgment: Judgment normal.          Assessment & Plan:  A-mild intermittent reactive airway disease with acute exacerbation, essential hypertension, class 1 obesity BMI 34-34.9  P-Pick up trelegy ellipta inhaler from [employer] today.  Manufacturers coupon resent to patient via text by RN [name].  Located on patient phone and ensured patient aware which numbers to give pharmacist for discount.  Start use tomorrow 1 puff daily.  Use albuterol prn 2 puffs po q4h.  Restart incentive spirometer use at home.  Continue to monitor sp02 at home and if trending downward notify clinic staff/pulmonology especially if consistently below 90.  Hypoxia exitcare handout.  Discussed if sp02 improves with new inhaler then related to lung problem causing low sp02 readings.  Patient at this time unsure if cardiac problems worsening sp02 readings.  Discussed cold air, RAD exacerbation/allergies can be worsening his readings this week also.  Patient reports at home spo2 will rise to 98% when not at work that day.  Patient exposed to dust at [facility].  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    Patient verbalized understanding information/instructions, agreed with plan of care and had no further questions at this time.  Slightly worse BP  off metoprolol x 1 week.  Will continue to monitor next week.  Patient to continue lasix prn for CHF, sotalol, hydralazine, spironolactone.  Has follow up with [facility] cardiology in 1 month.  Continue cardiac rehab appts.  Patient verbalized understanding information/instructions, agreed with plan of care and had no further questions at this time.  Continue weight loss efforts.

## 2022-01-05 ENCOUNTER — Telehealth (HOSPITAL_COMMUNITY): Payer: Self-pay

## 2022-01-05 NOTE — Telephone Encounter (Signed)
Called patient to see if he was interested in participating in the Pulmonary Rehab Program. Patient stated yes. Patient will come in for orientation on 02/09/2022@10 :30am and will attend the 10:15am exercise class.   Pensions consultant.

## 2022-01-07 ENCOUNTER — Other Ambulatory Visit: Payer: Self-pay | Admitting: Registered Nurse

## 2022-01-07 DIAGNOSIS — J4541 Moderate persistent asthma with (acute) exacerbation: Secondary | ICD-10-CM

## 2022-01-08 ENCOUNTER — Other Ambulatory Visit: Payer: Self-pay

## 2022-01-08 ENCOUNTER — Ambulatory Visit: Payer: No Typology Code available for payment source | Admitting: Physical Therapy

## 2022-01-08 ENCOUNTER — Encounter: Payer: Self-pay | Admitting: Physical Therapy

## 2022-01-08 DIAGNOSIS — R262 Difficulty in walking, not elsewhere classified: Secondary | ICD-10-CM | POA: Diagnosis not present

## 2022-01-08 DIAGNOSIS — M6281 Muscle weakness (generalized): Secondary | ICD-10-CM

## 2022-01-08 NOTE — Therapy (Signed)
Ryan Glass. [place], [place], [address] Phone: [phone]   Fax:  [phone]  Physical Therapy Treatment  Patient Details  Name: Ryan Glass MRN: [id number] Date of Birth: [birth year] Referring Provider (PT): Sherren Mocha   Encounter Date: 01/08/2022   PT End of Session - 01/08/22 1704     Visit Number 4    Number of Visits 18    Date for PT Re-Evaluation 02/14/22    PT Start Time 1633    PT Stop Time 1712    PT Time Calculation (min) 39 min    Activity Tolerance Patient limited by fatigue;Patient tolerated treatment well;Other (comment)   low SATS with rest.   Behavior During Therapy [facility] for tasks assessed/performed             Past Medical History:  Diagnosis Date   Ascending aortic aneurysm    a. CT 05/2021 showed 4.4x4.4cm TAA. Stable on chest CTA 12/2021 at 4.4cm.   Bilateral lower extremity edema    Celiac artery dilatation (HCC)    1.8cm at the celiac trunk on Chest CT 12/2021   Chronic gout without tophus    followed by dr Gerilyn Nestle    (06-08-2020  per pt last episode right knee 3 wks ago)   CKD (chronic kidney disease), stage II    nephrology--- Jen Mow PA (10-29-2019 note in epic scanned in  media)   Heart failure with mid-range ejection fraction ([place]) 05/27/2021   05/26/21: Left ventricular ejection fraction, by estimation, is 45 to 50%. There is severe asymmetric left ventricular hypertrophy. G1DD present.    History of COVID-19 06/07/2021   HOCM (hypertrophic obstructive cardiomyopathy) ([place])    s/p ICD 07/2021   Hypertension    followed by cardiology, dr t. turner   (05-14-2018 nuclear study in epic , normal perfusion with nuclear ef 61%)   Left hydrocele    Low serum potassium level 04/27/2014   OSA on CPAP    per pt uses every night   Palpitations followed by dr t. [name]   06-08-2020  still feels palipations due to PVCs when exertion but not with chest pain/ discomfort    Pneumonia due to COVID-19 virus    PVC's (premature ventricular contractions) cardiologist--- dr t. turner   Status post PVC ablation by [name] 2019 with recurrence of frequent PVCs/bigeminy;  prior pseudobradycardia r/t pvcs   Rheumatoid arthritis involving multiple sites [facility])    rheumotology--- dr a. Gerilyn Nestle  ([facility] in [place])    Past Surgical History:  Procedure Laterality Date   BIOPSY  10/08/2021   Procedure: BIOPSY;  Surgeon: Daryel November, MD;  Location: [facility];  Service: Gastroenterology;;   Kathleen Argue STUDY  08/18/2021   Procedure: BUBBLE STUDY;  Surgeon: Josue Hector, MD;  Location: [facility] ENDOSCOPY;  Service: Cardiovascular;;   ESOPHAGOGASTRODUODENOSCOPY N/A 10/08/2021   Procedure: ESOPHAGOGASTRODUODENOSCOPY (EGD);  Surgeon: Daryel November, MD;  Location: [facility];  Service: Gastroenterology;  Laterality: N/A;   HYDROCELE EXCISION Left 06/14/2020   Procedure: LEFT  HYDROCELECTOMY ADULT;  Surgeon: Robley Fries, MD;  Location: [facility];  Service: Urology;  Laterality: Left;   ICD IMPLANT N/A 08/07/2021   Procedure: ICD IMPLANT;  Surgeon: Evans Lance, MD;  Location: [place] CV LAB;  Service: Cardiovascular;  Laterality: N/A;   INCISIONAL HERNIA REPAIR  02-23-2016   @[facility]    LAPAROSCOPIC   LAPAROSCOPIC INGUINAL HERNIA REPAIR Bilateral 08-22-2015  @[facility]   AND UMBILICAL HERNIA REPAIR   PVC ABLATION N/A 10/07/2018   Procedure: PVC ABLATION;  Surgeon: Evans Lance, MD;  Location: [facility] CV LAB;  Service: Cardiovascular;  Laterality: N/A;   RIGHT/LEFT HEART CATH AND CORONARY ANGIOGRAPHY N/A 05/29/2021   Procedure: RIGHT/LEFT HEART CATH AND CORONARY ANGIOGRAPHY;  Surgeon: Burnell Blanks, MD;  Location: [place] CV LAB;  Service: Cardiovascular;  Laterality: N/A;   TEE WITHOUT CARDIOVERSION N/A 08/18/2021   Procedure: TRANSESOPHAGEAL ECHOCARDIOGRAM (TEE);  Surgeon: Josue Hector, MD;  Location: [facility] ENDOSCOPY;  Service:  Cardiovascular;  Laterality: N/A;   UMBILICAL HERNIA REPAIR  child    There were no vitals filed for this visit.   Subjective Assessment - 01/08/22 1638     Subjective Patient reports decreased knee pain today. He is performing HEP.    Pertinent History RA, L ventricular hypertrophy, chronic desaturation, PVC with ICD implant 8/22,    Currently in Pain? No/denies                               [facility] Adult PT Treatment/Exercise - 01/08/22 0001       Knee/Hip Exercises: Aerobic   Nustep L4 x 6 minutes      Knee/Hip Exercises: Standing   Hip Flexion Stengthening;Both;1 set;10 reps    Hip Flexion Limitations G Tband, only able to complete 8 reps with good form.    Hip Abduction Stengthening;Both;1 set;Knee straight    Abduction Limitations G Tband-8 reps    Hip Extension Stengthening;Both;1 set    Extension Limitations G Tband 8 reps      Shoulder Exercises: Standing   External Rotation Strengthening;Both;20 reps;Theraband    Theraband Level (Shoulder External Rotation) Level 3 (Green)    Extension Strengthening;Both;20 reps;Theraband    Theraband Level (Shoulder Extension) Level 3 (Green)    Row Strengthening;Both;20 reps;Theraband    Theraband Level (Shoulder Row) Level 3 Nyoka Cowden)                     PT Education - 01/08/22 1656     Education Details Updated HEP, use low weights when he returns to his weight bench exercises.              PT Short Term Goals - 01/08/22 1709       PT SHORT TERM GOAL #1   Title I with basic HEP    Status Achieved               PT Long Term Goals - 12/13/21 1924       PT LONG TERM GOAL #1   Title I with final HEP.    Time 9    Period Weeks    Status New    Target Date 02/14/22      PT LONG TERM GOAL #2   Title Patient will maintain SATS >89% at rest and during all activity.    Baseline <88% at rest    Time 9    Period Weeks    Status New    Target Date 02/14/22      PT LONG TERM  GOAL #3   Title Patient will walk at least 15 minutes without having to stop, maintianing SATS >90%    Baseline 5.15 minutes    Time 9    Period Weeks    Status New    Target Date 02/14/22      PT LONG TERM GOAL #  4   Title Patient will perform all daily functional activities with back pain < 3/10.    Time 9    Period Weeks    Status New    Target Date 02/14/22                   Plan - 01/08/22 1654     Clinical Impression Statement HEP is going well. His pain is up and down, appears to be affected by the weather. He asked if he should use heat or ice. He reports good pain relief with pain, so encouraged him to continue, but educated him that he can use either. He also expressed interest in using his weight set at home. initaited some upper body exercises for postural control.    Personal Factors and Comorbidities Fitness;Past/Current Experience;Comorbidity 2    Comorbidities L ventricular hypertrophy, chronic desaturation.    Examination-Activity Limitations Locomotion Level;Reach Overhead;Squat;Carry;Lift;Stairs;Stand    Examination-Participation Restrictions Cleaning;Occupation;Yard Work    [employer] officer Evolving/Moderate complexity    Rehab Potential Good    PT Frequency 2x / week    PT Duration Other (comment)   9w   PT Treatment/Interventions ADLs/Self Care Home Management;Electrical Stimulation;Neuromuscular re-education;Manual techniques;Energy conservation;Balance training;Therapeutic exercise;Therapeutic activities;Functional mobility training;Stair training;Gait training;Patient/family education    PT Next Visit Plan Update program, core stregnth.    PT Home Exercise Plan Tendon glide handout for hands.  [id number]    Consulted and Agree with Plan of Care Patient             Patient will benefit from skilled therapeutic intervention in order to improve the following deficits and impairments:  Difficulty walking, Cardiopulmonary status  limiting activity, Decreased endurance, Decreased activity tolerance, Pain, Postural dysfunction, Decreased strength, Decreased mobility, Decreased balance, Improper body mechanics  Visit Diagnosis: Difficulty in walking, not elsewhere classified  Muscle weakness (generalized)     Problem List Patient Active Problem List   Diagnosis Date Noted   PFO (patent foramen ovale) 12/28/2021   Asthma 12/26/2021   Celiac artery dilatation ([place]) 12/17/2021   CAP (community acquired pneumonia) 10/13/2021   Dyspnea    Gastrointestinal hemorrhage    Acute respiratory failure with hypoxia ([place]) 10/07/2021   ABLA (acute blood loss anemia) 10/07/2021   History of pulmonary embolus ([place]) 10/07/2021   AF (paroxysmal atrial fibrillation) ([place]) 10/07/2021   Hypertrophic cardiomyopathy ([place]) 08/15/2021   Ventricular tachycardia 08/02/2021   Ventricular tachycardia, unspecified 08/02/2021   Acute on chronic respiratory failure ([place]) 06/07/2021   History of COVID-19 06/07/2021   Rheumatoid arthritis involving multiple sites on Humira ([place]) 06/07/2021   On chronic prednisone therapy for Rheumatoid Arthritis 06/07/2021   CAD (coronary artery disease), non-obstructive 05/30/2021   Elevated troponin    Precordial chest pain    Heart failure with mid-range ejection fraction ([place]) 05/27/2021   LVH (left ventricular hypertrophy) due to hypertensive disease, with heart failure ([place]) 05/27/2021   Class 2 severe obesity with serious comorbidity and body mass index (BMI) of 35.0 to 35.9 in adult [facility]) 05/26/2021   Pulmonary embolism ([place]) 05/25/2021   Dyspnea on exertion    Thoracic aortic aneurysm without rupture 02/01/2021   Ascending aortic aneurysm 02/01/2021   Other hyperlipidemia 10/14/2020   Testicular swelling 05/14/2019   Pain in right knee 01/29/2019   PVC (premature ventricular contraction) 10/07/2018   Ventricular premature depolarization 10/07/2018   Dilated aortic root (HCC)    Allergic  rhinitis 01/10/2016   Chronic gout without tophus 01/10/2016   Drug therapy  01/10/2016   OSA (obstructive sleep apnea) 01/10/2016   Colonic polyp 12/14/2015   Rt groin pain 12/14/2015   Scoliosis of thoracolumbar spine 12/14/2015   Right lower quadrant abdominal pain 04/20/2015   Arthritis of knee 02/22/2015   Edema of extremities 02/22/2015   Essential hypertension 02/22/2015   Preventative health care 02/22/2015   Bradycardia by electrocardiogram 04/16/2014    Marcelina Morel, DPT 01/08/2022, 5:11 PM  [place] [place], [place], [address] Phone: [phone]   Fax:  [phone]  Name: NYCHOLAS PEOT MRN: [id number] Date of Birth: 1955/12/20

## 2022-01-08 NOTE — Patient Instructions (Signed)
Access Code: [email or web address] URL: [email or web address] Date: 01/08/2022 Prepared by: Oley BalmSusan Paelyn Smick  Exercises Supine Bridge - 1 x daily - 7 x weekly - 2 sets - 10 reps Hooklying Clamshell with Resistance - 1 x daily - 7 x weekly - 2 sets - 10 reps Supine March with Resistance Band - 1 x daily - 7 x weekly - 2 sets - 10 reps Straight Leg Raise with External Rotation - 1 x daily - 7 x weekly - 2 sets - 10 reps Shoulder Extension with Resistance - 1 x daily - 7 x weekly - 3 sets - 10 reps Standing 3-Way Leg Reach with Resistance at Ankles and Counter Support - 1 x daily - 7 x weekly - 3 sets - 10 reps Shoulder Extension with Resistance - 1 x daily - 7 x weekly - 3 sets - 10 reps Standing Bilateral Low Shoulder Row with Anchored Resistance - 1 x daily - 7 x weekly - 3 sets - 10 reps Shoulder External Rotation and Scapular Retraction with Resistance - 1 x daily - 7 x weekly - 3 sets - 10 reps

## 2022-01-08 NOTE — Telephone Encounter (Signed)
Spoke with patient via telephone and he stated going to [employer] today to pick up new inhaler Rx Trelegy Ellipta does not need fluticasone salmeterol at this time.  Patient to contact me if manufacturer coupon does not work.  Patient A&Ox3 spoke full sentences without difficulty during 5 minute telephone call.  No audible cough/congestion/throat clearing, wheezing or shortness of breath.  Patient notified me was contacted that space available for pulmonary rehab class now 0830 or 1030.  1330 class not available until April or May.  Patient needs to clear with supervisor if able to do 0830 class x 8 weeks before signing up.  Patient asked if he should still do class.  I encouraged him that yes it was important to strengthen his lungs prior to heat/humidity of the summer months.  Patient verbalized understanding information/instructions, agreed with plan of care and had no further questions at this time.

## 2022-01-09 NOTE — Telephone Encounter (Signed)
Patient notified me because he has medicare part A unable to use manufacturer coupon with [employer] insurance.  He has another refill of fluticasone at home at this time and going to continue use with albuterol prn.  Pulmonology rehab class schedule pending hoping to start in February.

## 2022-01-10 ENCOUNTER — Other Ambulatory Visit: Payer: Self-pay

## 2022-01-10 ENCOUNTER — Ambulatory Visit: Payer: No Typology Code available for payment source | Attending: Internal Medicine | Admitting: Physical Therapy

## 2022-01-10 ENCOUNTER — Encounter: Payer: Self-pay | Admitting: Physical Therapy

## 2022-01-10 DIAGNOSIS — M6281 Muscle weakness (generalized): Secondary | ICD-10-CM | POA: Diagnosis present

## 2022-01-10 DIAGNOSIS — G8929 Other chronic pain: Secondary | ICD-10-CM | POA: Insufficient documentation

## 2022-01-10 DIAGNOSIS — M25562 Pain in left knee: Secondary | ICD-10-CM | POA: Diagnosis present

## 2022-01-10 DIAGNOSIS — M25561 Pain in right knee: Secondary | ICD-10-CM | POA: Insufficient documentation

## 2022-01-10 DIAGNOSIS — R262 Difficulty in walking, not elsewhere classified: Secondary | ICD-10-CM | POA: Insufficient documentation

## 2022-01-10 NOTE — Telephone Encounter (Signed)
Patient seen in workcenter.  BBS lower lobes with decreased airflow and intermittent crackles.  Patient reported he did not do fluticasone salmeterol this am only albuterol.  Discussed with patient important to use his fluticasone salmeterol every day.  Discussed it is a dry powder not propelled administration.  He needs to suck the powder into his lungs from the inhaler.  Patient stated he feels at times nothing it coming out.  Counter says 20 puffs left.  He has a new one also available at home.  He is going to finish puffs on current and then start refill.  Patient was not able to pick up Trelegy Ellipta due to copay $100 and manufacturer coupon could not be applied since he has medicare part A and [employer].  Manufacturer coupon does not work with [employer].  Discussed with patient fluticasone salmeterol is his long acting inhaler and albuterol is short acting.  Encouraged patient to use his incentive spirometer today.  Sp02 RA 92% when talking and 89% at rest deep breaths/not talking.  Usual leg edema.  No wheezing/shortness of breath.  Spoke full sentences without difficulty.  Gait sure and steady in workcenter.  Skin warm dry and pink.  Patient reported he has appt 17 Feb with Dr Glori Luis [facility] at 1510 in [place], [place] echo with contrast [facility] cardiology of [place] 9 Mar and follow up with Dr Laurene Footman cardiology 9 Mar at [place].  Patient was contacted for pulmonary rehab scheduling but has not yet committed to date and time for the 8 week program.  He prefers late afternoon but morning and mid-day are the only slots open at this time.  Recheck patient on 01/11/22.  Patient verbalized understanding information/instructions, agreed with plan of care and had no further questions at this time.

## 2022-01-10 NOTE — Telephone Encounter (Signed)
Pt asked if a 1:15pm class came available for pulmonary rehab he would want that slot do to work. A slot became available and pt wanted to switch. Switched pt's appts from 10:15am to 1:15pm.

## 2022-01-10 NOTE — Therapy (Signed)
[facility] [address] [place], [place], [address] Phone: [phone]   Fax:  [phone]  Physical Therapy Treatment  Patient Details  Name: Ryan Glass MRN: [id number] Date of Birth: 1956-05-03 Referring Provider (PT): Tonny Bollman   Encounter Date: 01/10/2022   PT End of Session - 01/10/22 1705     Visit Number 5    Number of Visits 18    Date for PT Re-Evaluation 02/14/22    PT Start Time 1636    PT Stop Time 1715    PT Time Calculation (min) 39 min    Activity Tolerance Patient tolerated treatment well;Patient limited by fatigue    Behavior During Therapy [facility] for tasks assessed/performed             Past Medical History:  Diagnosis Date   Ascending aortic aneurysm    a. CT 05/2021 showed 4.4x4.4cm TAA. Stable on chest CTA 12/2021 at 4.4cm.   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 There is severe asymmetric left ventricular hypertrophy. G1DD present.    History of COVID-19 06/07/2021   HOCM (hypertrophic obstructive cardiomyopathy) (HCC)    s/p ICD 07/2021   Hypertension    followed by cardiology, dr t. turner   (05-14-2018 nuclear study in epic , normal perfusion with nuclear ef 61%)   Left hydrocele    Low serum potassium level 04/27/2014   OSA on CPAP    per pt uses every night   Palpitations followed by dr t. Mayford Knife   06-08-2020  still feels palipations due to PVCs when exertion but not with chest pain/ discomfort   Pneumonia due to COVID-19 virus     PVC's (premature ventricular contractions) cardiologist--- dr t. turner   Status post PVC ablation by Dr. Ladona Ridgel 2019 with recurrence of frequent PVCs/bigeminy;  prior pseudobradycardia r/t pvcs   Rheumatoid arthritis involving multiple sites [facility])    rheumotology--- dr a. Sharmon Revere  ([facility] in [place])    Past Surgical History:  Procedure Laterality Date   BIOPSY  10/08/2021   Procedure: BIOPSY;  Surgeon: Jenel Lucks, MD;  Location[facility] ENDOSCOPY;  Service: Gastroenterology;;   Thressa Sheller STUDY  08/18/2021   Procedure: BUBBLE STUDY;  Surgeon: Wendall Stade, MD;  Location: [facility] ENDOSCOPY;  Service: Cardiovascular;;   ESOPHAGOGASTRODUODENOSCOPY N/A 10/08/2021   Procedure: ESOPHAGOGASTRODUODENOSCOPY (EGD);  Surgeon: Jenel Lucks, MD;  Location: [facility] ENDOSCOPY;  Service: Gastroenterology;  Laterality: N/A;   HYDROCELE EXCISION Left 06/14/2020   Procedure: LEFT  HYDROCELECTOMY ADULT;  Surgeon: Noel Christmas, MD;  Location: [facility];  Service: Urology;  Laterality: Left;   ICD IMPLANT N/A 08/07/2021   Procedure: ICD IMPLANT;  Surgeon: Marinus Maw, MD;  Location: [facility] INVASIVE CV LAB;  Service: Cardiovascular;  Laterality: N/A;   INCISIONAL HERNIA REPAIR  02-23-2016   @[facility]    LAPAROSCOPIC   LAPAROSCOPIC INGUINAL HERNIA REPAIR Bilateral 08-22-2015  @[facility]    AND UMBILICAL HERNIA REPAIR  PVC ABLATION N/A 10/07/2018   Procedure: PVC ABLATION;  Surgeon: Evans Lance, MD;  Location: [facility] CV LAB;  Service: Cardiovascular;  Laterality: N/A;   RIGHT/LEFT HEART CATH AND CORONARY ANGIOGRAPHY N/A 05/29/2021   Procedure: RIGHT/LEFT HEART CATH AND CORONARY ANGIOGRAPHY;  Surgeon: Burnell Blanks, MD;  Location: [facility] CV LAB;  Service: Cardiovascular;  Laterality: N/A;   TEE WITHOUT CARDIOVERSION N/A 08/18/2021   Procedure: TRANSESOPHAGEAL ECHOCARDIOGRAM (TEE);  Surgeon: Josue Hector, MD;  Location[facility] ENDOSCOPY;  Service: Cardiovascular;  Laterality: N/A;    UMBILICAL HERNIA REPAIR  child    There were no vitals filed for this visit.   Subjective Assessment - 01/10/22 1645     Subjective Patient reports increased pain and stiffness today, probably due to cold and rainy weather. He feels more tired today.    Pertinent History RA, L ventricular hypertrophy, chronic desaturation, PVC with ICD implant 8/22,    Limitations Walking    How long can you sit comfortably? 30 minutes-develops L back pain.    How long can you stand comfortably? No breathing issues, but back pain again.    How long can you walk comfortably? If he walks fairly slowly, he can walk about 30 minutes.    Patient Stated Goals Decreased back pain, decreased SOB.    Currently in Pain? Yes    Pain Score 8     Pain Location Knee    Pain Orientation Left    Pain Descriptors / Indicators Aching    Pain Type Chronic pain    Pain Onset More than a month ago    Pain Frequency Intermittent    Aggravating Factors  weather , walking    Effect of Pain on Daily Activities L knee hurts about 3-4/10.                               [place] Adult PT Treatment/Exercise - 01/10/22 0001       Knee/Hip Exercises: Aerobic   Nustep L5 x 6 minutes      Knee/Hip Exercises: Supine   Straight Leg Raises Strengthening;Both;1 set;10 reps    Straight Leg Raise with External Rotation Strengthening;Both;1 set;10 reps    Straight Leg Raise with External Rotation Limitations Also performed with IR x 10 reps                       PT Short Term Goals - 01/08/22 1709       PT SHORT TERM GOAL #1   Title I with basic HEP    Status Achieved               PT Long Term Goals - 01/10/22 1654       PT LONG TERM GOAL #1   Title I with final HEP.    Time 9    Period Weeks    Status On-going    Target Date 02/14/22      PT LONG TERM GOAL #2   Title Patient will maintain SATS >89% at rest and during all activity.    Baseline Still dips below 89% at rest, but  improved recovery with deep breath, hold for 2 sec, release.    Time 5    Period Weeks    Status On-going    Target Date 02/14/22      PT LONG TERM GOAL #3   Title Patient will walk at least  15 minutes without having to stop, maintianing SATS >90%    Baseline 5.15 minutes    Time 6    Period Weeks    Status On-going    Target Date 02/14/22      PT LONG TERM GOAL #4   Title Patient will perform all daily functional activities with back pain < 3/10.    Baseline 4/10    Time 6    Period Weeks    Status On-going    Target Date 02/14/22                   Plan - 01/10/22 1719     Clinical Impression Statement Patient fatigued today, and also reports increased L knee pain, possibly due to weather. he participated well in a paced treatment, allowing for recovery between activities. SATS remains >/=90% at work and rest. Performed BLE strenthening with straight knee due to pain. He demosntrated some difficulty with hip abd B.    Personal Factors and Comorbidities Fitness;Past/Current Experience;Comorbidity 2    Comorbidities L ventricular hypertrophy, chronic desaturation.    Examination-Activity Limitations Locomotion Level;Reach Overhead;Squat;Carry;Lift;Stairs;Stand    Examination-Participation Restrictions Cleaning;Occupation;Yard Work    [employer]chant [employer] officer Evolving/Moderate complexity    Clinical Decision Making Moderate    Rehab Potential Good    PT Frequency 2x / week    PT Duration Other (comment)    PT Treatment/Interventions ADLs/Self Care Home Management;Electrical Stimulation;Neuromuscular re-education;Manual techniques;Energy conservation;Balance training;Therapeutic exercise;Therapeutic activities;Functional mobility training;Stair training;Gait training;Patient/family education    PT Next Visit Plan Update program, core stregnth.    PT Home Exercise Plan Tendon glide handout for hands.  [id number]    Consulted and Agree with Plan of Care Patient              Patient will benefit from skilled therapeutic intervention in order to improve the following deficits and impairments:  Difficulty walking, Cardiopulmonary status limiting activity, Decreased endurance, Decreased activity tolerance, Pain, Postural dysfunction, Decreased strength, Decreased mobility, Decreased balance, Improper body mechanics  Visit Diagnosis: Difficulty in walking, not elsewhere classified  Muscle weakness (generalized)  Chronic pain of left knee  Chronic pain of right knee     Problem List Patient Active Problem List   Diagnosis Date Noted   PFO (patent foramen ovale) 12/28/2021   Asthma 12/26/2021   Celiac artery dilatation ([place]) 12/17/2021   CAP (community acquired pneumonia) 10/13/2021   Dyspnea    Gastrointestinal hemorrhage    Acute respiratory failure with hypoxia ([place]) 10/07/2021   ABLA (acute blood loss anemia) 10/07/2021   History of pulmonary embolus ([place]) 10/07/2021   AF (paroxysmal atrial fibrillation) ([place]) 10/07/2021   Hypertrophic cardiomyopathy ([place]) 08/15/2021   Ventricular tachycardia 08/02/2021   Ventricular tachycardia, unspecified 08/02/2021   Acute on chronic respiratory failure ([place]) 06/07/2021   History of COVID-19 06/07/2021   Rheumatoid arthritis involving multiple sites on Humira ([place]) 06/07/2021   On chronic prednisone therapy for Rheumatoid Arthritis 06/07/2021   CAD (coronary artery disease), non-obstructive 05/30/2021   Elevated troponin    Precordial chest pain    Heart failure with mid-range ejection fraction ([place]) 05/27/2021   LVH (left ventricular hypertrophy) due to hypertensive disease, with heart failure ([place]) 05/27/2021   Class 2 severe obesity with serious comorbidity and body mass index (BMI) of 35.0 to 35.9 in adult [facility]) 05/26/2021   Pulmonary embolism ([place]) 05/25/2021   Dyspnea on exertion    Thoracic aortic aneurysm without rupture 02/01/2021   Ascending aortic aneurysm 02/01/2021  Other  hyperlipidemia 10/14/2020   Testicular swelling 05/14/2019   Pain in right knee 01/29/2019   PVC (premature ventricular contraction) 10/07/2018   Ventricular premature depolarization 10/07/2018   Dilated aortic root (HCC)    Allergic rhinitis 01/10/2016   Chronic gout without tophus 01/10/2016   Drug therapy 01/10/2016   OSA (obstructive sleep apnea) 01/10/2016   Colonic polyp 12/14/2015   Rt groin pain 12/14/2015   Scoliosis of thoracolumbar spine 12/14/2015   Right lower quadrant abdominal pain 04/20/2015   Arthritis of knee 02/22/2015   Edema of extremities 02/22/2015   Essential hypertension 02/22/2015   Preventative health care 02/22/2015   Bradycardia by electrocardiogram 04/16/2014    Marcelina Morel, DPT 01/10/2022, 5:22 PM  [address] [place], [place], [address] Phone: [phone]   Fax:  [phone]  Name: GARNIE BLODGETT MRN: [id number] Date of Birth: 11/03/1956

## 2022-01-12 NOTE — Telephone Encounter (Signed)
Patient seen in workcenter sp02 99% RA today while speaking and 95% at rest.  BBS CTA and trace pedal edema.  Weight last night  236lbs. Patient stated took fluticasone salmeterol yesterday afternoon but not this am.  Albuterol 2 puffs this am prior to work.  Had rehab appt yesterday and was tired afterwards.  Cardiac rehab scheduled and will start 02/09/22 per patient he was able to get his preferred time slot as another patient cancelled.  Patient A&Ox3 spoke full sentences without difficulty skin warm dry and pink.

## 2022-01-16 NOTE — Telephone Encounter (Signed)
Patient contacted clinic staff asked if NP could come to his desk as still having knee pain.  Some swelling noted around AICD again today denied redness/fever/chills/pain.  Has noticed more vibration sensation also.  He reported he had left over amoxicillin at home and started it again on Saturday and ran out of pills this am.   Denied pain in chest/neck or enlarged lymph nodes. Did incentive spirometer this weekend.  Has noticed at night before bed when he uses home sp02 reading 88% RA.  Using his cpap at night and stated slept well last night didn't wake up once to pee.  Patient reported usually wakes up 1-2 times per night usually.  Weight 234lbs this am.  Patient took lasix.  Denied leg swelling this am.  Patient asking again when he should take his lasix discussed I would review his chart when I returned to office and follow up with him.  Patient does not have therapy this week next appt next Thursday per patient.  Patient using his singulair, flonase, nasal saline but feels that singulair not working as well anymore. Lower eyelids swollen.  Denied teeth pain/sinus pain.  Has not been taking claritin generic in am discussed with patient to restart.  Can buy at [facility] typically a bottle for one year supply $10.  No congestion/cough/throat clearing audible.  Discussed with patient to use incentive spirometer at night and see if oxygen level improves.  Discussed with patient I would review his chart again for instructions regarding prn lasix use.  Patient asked if he can take more mg of his singulair like 2 pills.  Discussed with patient 10mg  is the maximum singulair dose.  Patient [facility] as [facility] scheduled later this month.  Patient puffiness around AICD mild today 0-1+/4 nonpitting no erythema/crepitus/heat/pain with palpation.  Patient RA sp02 sitting and talking today 98% but without talking gradually drops down to 88% and with deep breathing will improve to above 90.  With  talking sustained at 98%.  Encouraged patient to continue rehab appts, incentive spirometer use, pursed lip breathing if not at home and sp02 low, use albuterol inhaler prn and continue salmeterol/fluticasone inhaler every day.  Patient reported 1 episode in am of flushing this weekend after taking his medications.  Discussed with patient this was most likely side effect of medication but to continue logging time/dates/frequency and discuss with cardiology at next appt.  Will follow up with patient in 48 hours.  Patient spoke full sentences without difficulty no cough/congestion/throat clearing.  Noted some crackles bases of lungs again today on auscultation otherwise clear mid and upper.  Skin warm dry and pink.  Capillary refill less than 2 seconds.  Patient verbalized understanding information/instructions, agreed with plan of care and had no further questions at this time.  Epic reviewed lasix prescribed by Dr Denton LankGriffith Oct hospitalization for heart failure "Call MD:  Anytime you have any of the following symptoms: 1) 3 pound weight gain in 24 hours or 5 pounds in 1 week 2) shortness of breath, with or without a dry hacking cough 3) swelling in the hands, feet or stomach 4) if you have to sleep on extra pillows at night in order to breathe.  "  Message sent to patient preferred email with instructions.

## 2022-01-18 NOTE — Telephone Encounter (Signed)
Spoke with patient in workcenter.  Patient reported has slept past 3 nights all night through and not needing to get up to urinate wearing cpap.  Discussed with patient if he sits in recliner and elevates legs prior to bed it will help his legs to be less swollen at end of day and help to move fluid from legs back into circulation prior to bed.  Patient did note he has sat in recliner the previous couple of nights after dinner and prior to bed.  Patient reported he has had a little discomfort around AICD when moving left arm.  "Feels like I am wearing a bandaid and skin getting caught underneath"  Denied any shortness of breath, rash, worsening swelling around AICD.  Patient A&Ox3 BBS CTA skin warm dry and pink.  Nonpitting edema surrounding AICD left anterior chest 1+/4 no increased temperature/erythema/crepitus with palpation.  Leg edema 1+/sock lines today nonpitting.  Bilateral lower eyelids swollen 2+/4 bilateral allergic shiners. Skin warm dry and pink.  Spoke full sentences without difficulty.  Patient asking my recommendation regarding if he should continue orencia or try rinvoq instead as he received brochure in the mail and rinvoq is a pill versus injectable.  Discussed with patient per manufacturer information 3 to 6 months of treatment needed to know how his body will respond.  He started in November.  Manufacturer information sent to patient via email (screen shots).  Will review if rinvoq formulary for patient insurance.  Patient reported hands and knees still painful.  I encouraged patient to continue his PT/weight loss efforts and re-evaluate his response this summer.  Patient considering retiring from work this year as he wants to be able to travel and do things before his health may worsen as he ages.  Patient would like to travel internationally and have travel trailer in [place] for national travel.  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  Patient verbalized understanding information/instructions, agreed with plan of care and had no further questions at this time.

## 2022-01-23 DIAGNOSIS — K922 Gastrointestinal hemorrhage, unspecified: Secondary | ICD-10-CM

## 2022-01-23 DIAGNOSIS — R778 Other specified abnormalities of plasma proteins: Secondary | ICD-10-CM | POA: Diagnosis present

## 2022-01-23 DIAGNOSIS — R0609 Other forms of dyspnea: Secondary | ICD-10-CM | POA: Diagnosis present

## 2022-01-23 DIAGNOSIS — Z95810 Presence of automatic (implantable) cardiac defibrillator: Secondary | ICD-10-CM | POA: Insufficient documentation

## 2022-01-26 ENCOUNTER — Encounter: Payer: Self-pay | Admitting: Cardiology

## 2022-01-26 ENCOUNTER — Other Ambulatory Visit: Payer: Self-pay

## 2022-01-26 ENCOUNTER — Ambulatory Visit (INDEPENDENT_AMBULATORY_CARE_PROVIDER_SITE_OTHER): Payer: No Typology Code available for payment source | Admitting: Cardiology

## 2022-01-26 VITALS — BP 130/80 | HR 60 | Ht 70.0 in | Wt 235.2 lb

## 2022-01-26 DIAGNOSIS — I5032 Chronic diastolic (congestive) heart failure: Secondary | ICD-10-CM

## 2022-01-26 DIAGNOSIS — Q2112 Patent foramen ovale: Secondary | ICD-10-CM

## 2022-01-26 DIAGNOSIS — I2782 Chronic pulmonary embolism: Secondary | ICD-10-CM

## 2022-01-26 DIAGNOSIS — R0609 Other forms of dyspnea: Secondary | ICD-10-CM | POA: Diagnosis not present

## 2022-01-26 DIAGNOSIS — I421 Obstructive hypertrophic cardiomyopathy: Secondary | ICD-10-CM | POA: Diagnosis not present

## 2022-01-26 DIAGNOSIS — I10 Essential (primary) hypertension: Secondary | ICD-10-CM

## 2022-01-26 DIAGNOSIS — I7121 Aneurysm of the ascending aorta, without rupture: Secondary | ICD-10-CM

## 2022-01-26 DIAGNOSIS — I472 Ventricular tachycardia, unspecified: Secondary | ICD-10-CM

## 2022-01-26 DIAGNOSIS — I2510 Atherosclerotic heart disease of native coronary artery without angina pectoris: Secondary | ICD-10-CM | POA: Diagnosis not present

## 2022-01-26 MED ORDER — POTASSIUM CHLORIDE CRYS ER 20 MEQ PO TBCR: EXTENDED_RELEASE_TABLET | ORAL | 3 refills | Status: DC

## 2022-01-26 MED ORDER — NEBIVOLOL HCL 5 MG PO TABS: 5.0000 mg | ORAL_TABLET | Freq: Every day | ORAL | 3 refills | Status: DC

## 2022-01-26 NOTE — Patient Instructions (Addendum)
Medication Instructions:  Your physician has recommended you make the following change in your medication:   START Bystolic 5 mg taking 1 daily  REDUCE the Potassium to only when you take a Lasix (Furosemide)  *If you need a refill on your cardiac medications before your next appointment, please call your pharmacy*   Lab Work: 1 WEEK:  BMET   If you have labs (blood work) drawn today and your tests are completely normal, you will receive your results only by: MyChart Message (if you have MyChart) OR A paper copy in the mail If you have any lab test that is abnormal or we need to change your treatment, we will call you to review the results.   Testing/Procedures: Your physician recommends that you have a VQ Scan ([facility]).  This is done at [facility]  Your physician has recommended that you have a cardiopulmonary stress test (CPX). CPX testing is a non-invasive measurement of heart and lung function. It replaces a traditional treadmill stress test. This type of test provides a tremendous amount of information that relates not only to your present condition but also for future outcomes. This test combines measurements of you ventilation, respiratory gas exchange in the lungs, electrocardiogram (EKG), blood pressure and physical response before, during, and following an exercise protocol.    Follow-Up: At [facility], you and your health needs are our priority.  As part of our continuing mission to provide you with exceptional heart care, we have created designated Provider Care Teams.  These Care Teams include your primary Cardiologist (physician) and Advanced Practice Providers (APPs -  Physician Assistants and Nurse Practitioners) who all work together to provide you with the care you need, when you need it.  We recommend signing up for the patient portal called "MyChart".  Sign up information is provided on this After Visit Summary.  MyChart is used to connect with  patients for Virtual Visits (Telemedicine).  Patients are able to view lab/test results, encounter notes, upcoming appointments, etc.  Non-urgent messages can be sent to your provider as well.   To learn more about what you can do with MyChart, go to [email or web address].    Your next appointment:     The format for your next appointment:   In Person  Provider:      Other Instructions Ventilation Perfusion Scan A ventilation-perfusion scan is a procedure to look at airflow and blood flow in the lungs. It is most often done to look for a blood clot that may have traveled to the lungs (pulmonary embolus). During this scan, radioactive compounds are injected into the bloodstream and are also breathed in. The compounds are not harmful. They are given at very low doses and stay in the body for a short time. After the compounds are injected and breathed in, a camera is used to scan the lungs. Tell a health care provider about: Any allergies you have. All medicines you are taking, including vitamins, herbs, eye drops, creams, and over-the-counter medicines. Any blood disorders you have. Any surgeries you have had. Any medical conditions you have. Whether you are pregnant or may be pregnant. Whether or not you are breastfeeding. What are the risks? Generally, this is a safe procedure. However, problems may occur, including an allergic reaction to the radioactive compounds. What happens before the procedure? Do not use any products that contain nicotine or tobacco before the procedure. These products include cigarettes, chewing tobacco, and vaping devices, such as e-cigarettes. If  you need help quitting, ask your health care provider. Ask your health care provider about changing or stopping your regular medicines. This is especially important if you are taking diabetes medicines or blood thinners. What happens during the procedure? An IV will be inserted into one of your veins. The IV will  stay in place for the entire exam. A small amount of radioactive material will be injected into your bloodstream. Your lungs will be scanned with a camera. This camera will record the images. You will be asked to inhale a second radioactive compound. Take deep breaths as directed by the health care provider. Your lungs will be scanned again. Your IV will be removed. The procedure may vary among health care providers and hospitals. What can I expect after the procedure? You may go home unless your health care provider instructs you differently. You may continue with your normal activities and diet as instructed by your health care provider. It is up to you to get the results of your procedure. Ask your health care provider, or the department that is doing the procedure, when your results will be ready. Summary A ventilation-perfusion scan is a procedure to look at airflow and blood flow in the lungs. It is most often done to look for blood clots that may have traveled to the lungs. The radioactive compounds used during the procedure are not harmful. Your lungs will be scanned with a camera. This camera will record the images. It is up to you to get the results of your procedure. Ask your health care provider, or the department that is doing the procedure, when your results will be ready. This information is not intended to replace advice given to you by your health care provider. Make sure you discuss any questions you have with your health care provider. Document Revised: 08/09/2021 Document Reviewed: 10/28/2020 Elsevier Patient Education  2022 [employer]   Cardiopulmonary Exercise Stress Test Cardiopulmonary exercise testing (CPET) is a test that checks how the heart and lungs react to exercise. This is called "exercise capacity." During this test, you will walk or run on a treadmill or pedal on a stationary bike. As you walk or run, tests will be done on your heart and lungs. You may have  this test to: Find out why you are short of breath. Check for exercise intolerance. See how your lungs work. See how your heart works. Check whether your heart or lungs are responding to treatments. Check if you have a heart or lung problem. Check if you are healthy enough to have surgery. Tell your doctor about: Any allergies you have. All medicines you are taking. Any problems you or family members have had with anesthetic medicines. Any blood disorders you have. Any surgeries you have had. Any medical conditions you have. Whether you are pregnant or may be pregnant. What are the risks? Generally, this is a safe test. However, problems may occur, including: Chest pain. Shortness of breath. Leg pain. Irregular heartbeat. What happens before the procedure? Follow instructions from your doctor about what you cannot eat or drink. Ask your doctor about changing or stopping your normal medicines. Wear loose, comfortable clothing and shoes. If you use an inhaler, bring it with you to the test. What happens during the procedure?  A blood pressure cuff will be placed on your arm. Stick-on patches (electrodes) will be placed on your chest. They will be attached to an EKG machine. A clip-on monitor that shows the amount of oxygen in your  blood will be placed on your finger (pulse oximeter). A clip will be placed on your nose and a mouthpiece will be placed in your mouth. This may be held in place with a headpiece. You will breathe through the mouthpiece during the test. You will be asked to start exercising. You will be closely watched while you exercise. The amount of effort for your exercise will be slowly increased. During exercise, the test will measure: Your heart rate. Your heart rhythm. Your oxygen blood level. The amount of oxygen and carbon dioxide that you breathe out. The test will end when: You have finished the test. You have reached your maximum ability to  exercise. You have chest or leg pain, dizziness, or shortness of breath. The test may vary among doctors and hospitals. What can I expect after the test? Your blood pressure, heart rate, breathing rate, and blood oxygen level will be monitored until you leave the hospital or clinic. Summary Cardiopulmonary exercise testing (CPET) is a test that checks how your heart and lungs react to exercise. Follow your doctor's instructions about food and drink, and what medicines to change or stop. During this test, you will walk or run on a treadmill or pedal on a stationary bike. Tests will be done as you run or walk. This information is not intended to replace advice given to you by your health care provider. Make sure you discuss any questions you have with your health care provider. Document Revised: 02/12/2019 Document Reviewed: 07/31/2018 Elsevier Patient Education  2022 [employer].

## 2022-01-26 NOTE — Progress Notes (Addendum)
Cardiology Office Note    Date:  01/26/2022   ID:  Ryan Glass, DOB Aug 14, 1956, MRN [id number]   PCP:  [name], MD   [place]  Cardiologist:  [name], MD  Electrophysiologist:  [name], MD  Chief Complaint  Patient presents with   Coronary Artery Disease   Shortness of Breath   Hypertension   Congestive Heart Failure    History of Present Illness:  Ryan Glass is a 66 y.o. male  with a hx of PVCs s/p PVC ablation by 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  He was found to have an acute PE with RV failure mildly reduced LVEF 45 to 50%.  Right and left heart cath 05/29/2021  showed 20% proximal RCA, 20% proximal to mid circumflex, 30% mid LAD normal right and left heart pressures.  His shortness of breath was felt to be out of proportion to his very small PE.  I reviewed the echo images and felt his RV was mildly dilated but RV EF was normal and LVEF was normal. He was referred for pulmonary consult due to ongoing SOB and restarted diltiazem 180 mg daily and Lasix was stopped because he was not fluid overloaded.  He was restarted on chlorthalidone 25 mg daily for hypertension.  He was see back in the office in August by Estella Husk, PA and was still complaining of DOE with little activity. He also was complaining of low HRs.  He saw pulmonary for Dyspnea on exertion/COPD placed on home O2 by pulmonary for O2 sats in the 80s with walking.   He was noted on an EKG done by PCP to have bradycardia and frequent PVC's. He was also complaining of complaining of leg swelling but had been eating fast food at least 3 times weekly.      He wore a heart monitor to assess  bradycardia and PVC load and was noted to have symptomatic ventricular tachycardia on monitor as well as PAF and was admitted to ER. Echo showed normal LVF with EF 60-65% with mild LVH, mildly reduced RVF, mild RVE and positive PFO.  He was started on Verapamil for AFib/VT/PVCs and monitored on tele.  cMRI showed findings c/w HOCM with LVEF 52% and RVEF 45%.  He underwent ICD placement on 08/07/2021. He was discharged home on Carvedilol and Verapamil.    He was hospitalized at the end of October with worsening shortness of breath and acute hypoxic respiratory failure.  He was eventually weaned off oxygen and was hypovolemic on admission.  Unfortunately he was found to have a GI bleed and underwent EGD showing gastric ulcer and gastritis.  Hemoglobin was stabilized at discharge sent.  It was recommended that he hold aspirin until his ulcer treatment was completed.  His losartan had been held because of soft blood pressures in the hospital.  His chlorthalidone was stopped and he was continued on oral Lasix and spironolactone restarted.    He was seen by Dr. Burt Knack on 11/15/2021 for follow-up of his PFO.  At that time the patient was no longer hypoxemia and it was felt that his chronic dyspnea was multifactorial from chronic CHF, obesity and deconditioning it was not felt that transcatheter PFO closure would  benefit him.  He was also referred to Dr. Owens Shark for evaluation of HOCM.  It was unclear whether he truly had HOCM versus hypertensive Fina copy in the setting of prolonged hypertension.  His meds were continued and it was recommended that he check ambulatory blood pressure at home and if significantly elevated start Imdur since there was no evidence of LVOT obstruction.  It was recommended that an alpha galactosidase level be checked to rule out Fabry's disease.  Genetic testing was also discussed but family wanted to wait until they saw him back in 3 months.    He is here today for followup.  SInce I  saw him last he went to [facility] and EP for a second opinion.  They were concerned that his sx may be related to PVCs and was referred to EP at [facility].  It was felt that he did not need 2 BB and his metoprolol was stopped and kept on Sotolol despite having HOCM.   He continues to have SOB.  He is not on O2 and O2 sats today are 93%.  He is with his daughter today and both are very frustrated that no one can find the cause of his SOB and intermittent drops in his O2 sats at home as low as 89%. He denies any chest pain or pressure, PND, orthopnea, dizziness, palpitations or syncope. He has chronic LE edema.  He is compliant with his meds and is tolerating meds with no SE.     Past Medical History:  Diagnosis Date   Ascending aortic aneurysm    a. CT 05/2021 showed 4.4x4.4cm TAA. Stable on chest CTA 12/2021 at 4.4cm.   Bilateral lower extremity edema    Celiac artery dilatation (HCC)    1.8cm at the celiac trunk on Chest CT 12/2021   Chronic gout without tophus    followed by dr Gerilyn Nestle    (06-08-2020  per pt last episode right knee 3 wks ago)   CKD (chronic kidney disease), stage II    nephrology--- Jen Mow PA (10-29-2019 note in epic scanned in  media)   Heart failure with mid-range ejection fraction ([place]) 05/27/2021   05/26/21: Left ventricular ejection fraction, by estimation, is 45 to 50%. There is severe asymmetric left ventricular hypertrophy. G1DD present.    History of COVID-19 06/07/2021   HOCM (hypertrophic obstructive cardiomyopathy) ([place])    s/p ICD 07/2021   Hypertension    followed by cardiology, dr t. Araminta Zorn   (05-14-2018 nuclear study in epic , normal perfusion with nuclear ef 61%)   Left hydrocele    Low serum potassium level 04/27/2014   OSA on CPAP    per pt uses every night   Palpitations followed by dr t. [name]   06-08-2020  still feels palipations due to PVCs when exertion but not with chest pain/ discomfort   Pneumonia due to COVID-19 virus    PVC's  (premature ventricular contractions) cardiologist--- dr t. Marcell Chavarin   Status post PVC ablation by [name] 2019 with recurrence of frequent PVCs/bigeminy;  prior pseudobradycardia r/t pvcs   Rheumatoid arthritis involving multiple sites [facility])    rheumotology--- dr a. Gerilyn Nestle  ([facility] in [place])    Past Surgical History:  Procedure Laterality Date   BIOPSY  10/08/2021   Procedure: BIOPSY;  Surgeon: Daryel November, MD;  Location: [place];  Service: Gastroenterology;;   Kathleen Argue STUDY  08/18/2021   Procedure: BUBBLE STUDY;  Surgeon: Josue Hector, MD;  Location: [place] ENDOSCOPY;  Service: Cardiovascular;;   ESOPHAGOGASTRODUODENOSCOPY N/A 10/08/2021   Procedure: ESOPHAGOGASTRODUODENOSCOPY (EGD);  Surgeon: Daryel November, MD;  Location: [place];  Service: Gastroenterology;  Laterality: N/A;   HYDROCELE EXCISION Left 06/14/2020   Procedure: LEFT  HYDROCELECTOMY ADULT;  Surgeon: Robley Fries, MD;  Location: [facility];  Service: Urology;  Laterality: Left;   ICD IMPLANT N/A 08/07/2021   Procedure: ICD IMPLANT;  Surgeon: Evans Lance, MD;  Location: [place] CV LAB;  Service: Cardiovascular;  Laterality: N/A;   INCISIONAL HERNIA REPAIR  02-23-2016   @[facility]    LAPAROSCOPIC   LAPAROSCOPIC INGUINAL HERNIA REPAIR Bilateral 08-22-2015  @[facility]    AND UMBILICAL HERNIA REPAIR   PVC ABLATION N/A 10/07/2018   Procedure: PVC ABLATION;  Surgeon: Evans Lance, MD;  Location: [place] CV LAB;  Service: Cardiovascular;  Laterality: N/A;   RIGHT/LEFT HEART CATH AND CORONARY ANGIOGRAPHY N/A 05/29/2021   Procedure: RIGHT/LEFT HEART CATH AND CORONARY ANGIOGRAPHY;  Surgeon: Burnell Blanks, MD;  Location: [place] CV LAB;  Service: Cardiovascular;  Laterality: N/A;   TEE WITHOUT CARDIOVERSION N/A 08/18/2021   Procedure: TRANSESOPHAGEAL ECHOCARDIOGRAM (TEE);  Surgeon: Josue Hector, MD;  Location: [place];  Service: Cardiovascular;  Laterality: N/A;   UMBILICAL  HERNIA REPAIR  child    Current Medications: Current Meds  Medication Sig   Abatacept (ORENCIA CLICKJECT) 0000000 MG/ML SOAJ Inject into the skin once a week.   Acetaminophen-Codeine 300-30 MG tablet Take 1-2 tablets by mouth every 12 (twelve) hours as needed for pain.   albuterol (VENTOLIN HFA) 108 (90 Base) MCG/ACT inhaler Inhale 2 puffs into the lungs every 4 (four) hours as needed for wheezing or shortness of breath.   apixaban (ELIQUIS) 5 MG TABS tablet Take 1 tablet (5 mg total) by mouth 2 (two) times daily.   atorvastatin (LIPITOR) 40 MG tablet Take 1 tablet (40 mg total) by mouth daily.   colchicine 0.6 MG tablet Take 1-2 tablets by mouth daily.   fluticasone (FLONASE) 50 MCG/ACT nasal spray Place 1 spray into both nostrils daily.   fluticasone-salmeterol (ADVAIR) 250-50 MCG/ACT AEPB    Fluticasone-Umeclidin-Vilant (TRELEGY ELLIPTA) 100-62.5-25 MCG/ACT AEPB Inhale 1 puff into the lungs daily.   folic acid (FOLVITE) 1 MG tablet Take 1 mg by mouth daily.   furosemide (LASIX) 40 MG tablet Take 1 tablet (40 mg total) by mouth daily as needed (weight gain). Take 1 tablet by mouth daily for 3 days, then decrease to only as needed for increase weight gain   gabapentin (NEURONTIN) 600 MG tablet Take 600 mg by mouth with breakfast, with lunch, and with evening meal.   hydrALAZINE (APRESOLINE) 100 MG tablet Take 100 mg by mouth 3 (three) times daily.   loratadine (CLARITIN) 10 MG tablet Take 10 mg by mouth daily.   Magnesium Oxide 400 MG CAPS Take 1 capsule (400 mg total) by mouth every other day. While taking daily multivitamin with 100mg  magnesium every day   montelukast (SINGULAIR) 10 MG tablet Take 1 tablet (10 mg total) by mouth at bedtime.   nebivolol (BYSTOLIC) 5 MG tablet Take 1 tablet (5 mg total) by mouth daily.   pantoprazole (PROTONIX) 40 MG tablet Take 1 tablet (40 mg total) by mouth 2 (two) times daily.   potassium chloride SA (KLOR-CON M) 20 MEQ tablet Take 1 tablet by  mouth only  when you have to use a Lasix   predniSONE (DELTASONE) 5 MG tablet Take 5 mg by mouth daily with breakfast.  sildenafil (REVATIO) 20 MG tablet Take 20 mg by mouth daily as needed (erectile dysfunction).   sotalol (BETAPACE) 120 MG tablet Take 1 tablet (120 mg total) by mouth every 12 (twelve) hours.   spironolactone (ALDACTONE) 25 MG tablet Take 1 tablet (25 mg total) by mouth daily.   [DISCONTINUED] potassium chloride SA (KLOR-CON) 20 MEQ tablet Take 2 tablets (40 mEq total) by mouth 2 (two) times daily.     Allergies:   Amlodipine besylate and Aspirin   Social History   Socioeconomic History   Marital status: Married    Spouse name: Not on file   Number of children: Not on file   Years of education: Not on file   Highest education level: Not on file  Occupational History   Not on file  Tobacco Use   Smoking status: Never   Smokeless tobacco: Never  Vaping Use   Vaping Use: Never used  Substance and Sexual Activity   Alcohol use: Not Currently    Alcohol/week: 5.0 - 7.0 standard drinks    Types: 5 - 7 Cans of beer per week    Comment: Stop drinking alcohol 3 months ago   Drug use: Never   Sexual activity: Not on file  Other Topics Concern   Not on file  Social History Narrative   Not on file   Social Determinants of Health   Financial Resource Strain: Not on file  Food Insecurity: Not on file  Transportation Needs: Not on file  Physical Activity: Not on file  Stress: Not on file  Social Connections: Not on file     Family History:  The patient's  family history includes Cardiomyopathy in his mother; Heart attack in his father.   ROS:   Please see the history of present illness.    ROS All other systems reviewed and are negative.   PHYSICAL EXAM:   VS:  BP 130/80    Pulse 60    Ht 5\' 10"  (1.778 m)    Wt 235 lb 3.2 oz (106.7 kg)    SpO2 93%    BMI 33.75 kg/m   Physical Exam  GEN: Well nourished, well developed in no acute distress HEENT: Normal NECK: No JVD;  No carotid bruits LYMPHATICS: No lymphadenopathy CARDIAC:RRR, no murmurs, rubs, gallops RESPIRATORY:  Clear to auscultation without rales, wheezing or rhonchi  ABDOMEN: Soft, non-tender, non-distended MUSCULOSKELETAL:  No edema; No deformity  SKIN: Warm and dry NEUROLOGIC:  Alert and oriented x 3 PSYCHIATRIC:  Normal affect   Wt Readings from Last 3 Encounters:  01/26/22 235 lb 3.2 oz (106.7 kg)  12/22/21 232 lb (105.2 kg)  12/13/21 238 lb (108 kg)      Studies/Labs Reviewed:   EKG:  EKG is not ordered today.     Recent Labs: 06/06/2021: TSH 0.975 10/07/2021: ALT 26; B Natriuretic Peptide 33.9 10/12/2021: Hemoglobin 9.2; Platelets 146 12/05/2021: NT-Pro BNP 183 12/18/2021: BUN 33; Creatinine, Ser 1.40; Magnesium 2.4; Potassium 4.7; Sodium 139   Lipid Panel    Component Value Date/Time   CHOL 123 05/27/2021 0138   CHOL 136 02/20/2021 1215   TRIG 67 05/27/2021 0138   HDL 49 05/27/2021 0138   HDL 64 02/20/2021 1215   CHOLHDL 2.5 05/27/2021 0138   VLDL 13 05/27/2021 0138   LDLCALC 61 05/27/2021 0138   LDLCALC 60 02/20/2021 1215    Additional studies/ records that were reviewed today include:  Cardiac Cath 05/29/2021 reviewed as below:   Conclusion  Prox RCA lesion is 20% stenosed. Prox Cx to Mid Cx lesion is 20% stenosed. Mid LAD lesion is 30% stenosed.   Mild non-obstructive CAD Normal right and left heart pressures   Recommendations: Medical management of mild CAD. Will resume IV heparin 6 hours post sheath pull given acute PE. Would transition to Eliquis or Xarelto tomorrow.   Echo 05/26/21 IMPRESSIONS     1. Left ventricular ejection fraction, by estimation, is 45 to 50%. The  left ventricle has mildly decreased function. The left ventricle has no  regional wall motion abnormalities. There is severe asymmetric left  ventricular hypertrophy. Left ventricular   diastolic parameters are consistent with Grade I diastolic dysfunction  (impaired relaxation).    2. Right ventricular systolic function is moderately reduced. The right  ventricular size is moderately enlarged. There is moderately elevated  pulmonary artery systolic pressure.   3. Left atrial size was moderately dilated.   4. Right atrial size was mildly dilated.   5. The mitral valve is normal in structure. Mild mitral valve  regurgitation.   6. The aortic valve is normal in structure. Aortic valve regurgitation is  not visualized. No aortic stenosis is present.   7. Aortic dilatation noted. There is mild dilatation of the ascending  aorta, measuring 41 mm.    CT 06/07/21 IMPRESSION: 1. Bland appearing, bandlike scarring and or partial atelectasis of the dependent lungs, unchanged compared to prior examination. No evidence of fibrotic interstitial lung disease. No significant air trapping on expiratory phase imaging. 2. Cardiomegaly and coronary artery disease. 3. Unchanged enlargement of the tubular ascending thoracic aorta, measuring up to 4.4 x 4.4 cm. Recommend annual imaging followup by CTA or MRA if not otherwise imaged. This recommendation follows 2010 ACCF/AHA/AATS/ACR/ASA/SCA/SCAI/SIR/STS/SVM Guidelines for the Diagnosis and Management of Patients with Thoracic Aortic Disease. Circulation. 2010; [id number]. Aortic aneurysm NOS (ICD10-I71.9)   Aortic Atherosclerosis (ICD10-I70.0).     Electronically Signed   By: Eddie Candle M.D.   On: 06/07/2021 15:12  cMRI 07/2021 IMPRESSION: 1. Asymmetric LV hypertrophy measuring 61mm in basal septum (41mm in basal lateral wall). This meets criteria for hypertrophic cardiomyopathy   2. Patchy late gadolinium enhancement at RV insertion site and basal lateral wall. LGE accounts for 1% of total myocardial mass. While this LGE pattern is consistent with HCM, would also consider Fabry disease as it can present with basal lateral LGE and LVH. Would recommend checking alpha-galactosidase A level   3. Normal LV size and  systolic function (EF [id number]), though was difficult to quantify volumes/EF due to significant motion artifact during acquisition   4.  Normal RV size with mild systolic dysfunction (EF [id number])   5.  Small pericardial effusion   6.  Dilated ascending aorta measuring 104mm    ASSESSMENT:    1. PFO (patent foramen ovale)   2. HOCM (hypertrophic obstructive cardiomyopathy) ([place])   3. Coronary artery disease involving native coronary artery of native heart without angina pectoris   4. DOE (dyspnea on exertion)   5. Essential hypertension   6. Chronic diastolic heart failure ([place])   7. VT (ventricular tachycardia)   8. Chronic pulmonary embolism, unspecified pulmonary embolism type, unspecified whether acute cor pulmonale present ([place])   9. Aneurysm of ascending aorta without rupture      PLAN:   PFO -noted to be small on recent echo with L?R shunting -doubt this is the cause of his SOB and ongoing hypoxemia but Pulmonary has no other etiology for persistent hypoxemia -  Evaluated by Dr. Burt Knack with structural heart who felt that his shortness of breath was not related to PFO and a PFO closure would not benefit him  HOCM -noted on recent cMRI done for workup fo VT -s/p AICD implant by [name] -Followed Dr. Gasper Sells and work-up in progress -alpha galactosidase  to rule out Fabry's disease was negative -He has follow-up in 3 months in  HOCM clinic to discuss genetic testing  ASCAD -minimal on cath, R/L heart pressures normal.  -DOE out of proportion to CAD.   -He has not had any chest pain or pressure -restart BB but with beta 1 selective BB with Bystolic 5mg  daily and statin therapy with as needed refills -no ASA due to DOAC  Dyspnea on exertion/COPD  -placed on home O2 by pulmonary for O2 sats were in the 80s with walking.   -workup for ongoing hypoxemia per pulmonary but no obvious cause except possible shunting with PFO -O2 sats on RA today are 93% on RA and he has not  been on O2 lately and sats have for the most part remained in the 90's -I do not think his SOB is related to underlying cardiac disease and I explained this to him today -I have recommended getting a VQ scan to rule out peripheral PE -I have also recommended a CPXT to help elucidate whether this is pulmonary, cardiac or body habitus limiting SOB  HTN  -BP is adequately controlled on exam today. -Continue hydralazine 100 mg every 8 hours and spironolactone 25 mg daily with as needed refills -his BB was stopped and we are restarting Bystolic 5mg  daily  Chronic diastolic heart failure/lower extremity edema  -suspect secondary to high salt diet.   -He does not appear volume overloaded on exam today. -Does have grade 1 DD on echo but had normal filling pressure on cath.   -Continue Lasix 40 mg daily as needed for edema and weight greater than 3 pounds in 1 day or 5 pounds 1 week with.  Refills -I instructed him to only take the potassium when he takes the Lasix -check BMET in 1 week  Freq PVC's /VT -s/p previous ablation with recurrence and history of pseudo bradycardia related to PVCs.   -s/p AICD secondary to VT and dx of HOCM on cMRI -Last ICD check showed PVC load <1% -Continue sotalol 120 mg twice daily  -restart BB with Bystolic 5mg  daily -Followed by EP  Acute PE -followed by Pulmonary -small and not felt to be cause of persistent hypoxemia -continue DOAC -I am going get a VQ scan to rule out peripheral PE  Thoracic aortic aneurysm  -4.4 cm on CT angio 12/2021 -followed with yearly CTA  I had a long discussion with Mr. Marez and his daughter over 45 minutes reviewing all his medical history, cardiac and pulmonary studies that have been done.  I do not think that his shortness of breath is cardiac in etiology.  His O2 sats have been above 90% now for some time off oxygen.  I reviewed with him that he needs to be on a beta-blocker as well as Betapace not only to suppress the PVCs  but also for his HOCM.  I am going to restart beta-blocker therapy with Bystolic 5 mg daily in hopes that that would cause less fatigue during the day.  I do not think PVCs are causing his symptoms as his last PVC load this past month on ICD check was less than 1%.  We will get  a VQ scan to make sure we have not missed any ongoing peripheral PEs.  I will also get a cardiopulmonary stress test to try to determine if shortness of breath is pulmonary limiting, cardiac limiting or due to his body habitus.  Overall I suspect that his shortness of breath is more related to obesity and prior COVID infection.  His last echo in August 22 showed normal LV function with EF 60 to 65% with mildly reduced RV systolic function.  I agree with Dr. Burt Knack but I do not think his PFO is significant to cause desaturations and shortness of breath especially in light of a normal recent right heart cath.  If his cardiopulmonary stress test shows no cardiac imitation and VQ scan is normal I have recommended that he follow-up with [name] for his ICD and VT with Dr. Gasper Sells for his HOCM long-term.  I can see him PRN.  Shared Decision Making/Informed Consent        Medication Adjustments/Labs and Tests Ordered: Current medicines are reviewed at length with the patient today.  Concerns regarding medicines are outlined above.  Medication changes, Labs and Tests ordered today are listed in the Patient Instructions below. Patient Instructions  Medication Instructions:  Your physician has recommended you make the following change in your medication:   RESTART Metoprolol 50 mg taking 1 twice a day  REDUCE the Potassium to only when you take a Lasix (Furosemide)  *If you need a refill on your cardiac medications before your next appointment, please call your pharmacy*   Lab Work: TODAY:  BMET   If you have labs (blood work) drawn today and your tests are completely normal, you will receive your results only  by: [place] (if you have MyChart) OR A paper copy in the mail If you have any lab test that is abnormal or we need to change your treatment, we will call you to review the results.   Testing/Procedures: Your physician recommends that you have a VQ Scan ([facility]).  This is done at [place]: At [facility], you and your health needs are our priority.  As part of our continuing mission to provide you with exceptional heart care, we have created designated Provider Care Teams.  These Care Teams include your primary Cardiologist (physician) and Advanced Practice Providers (APPs -  Physician Assistants and Nurse Practitioners) who all work together to provide you with the care you need, when you need it.  We recommend signing up for the patient portal called "MyChart".  Sign up information is provided on this After Visit Summary.  MyChart is used to connect with patients for Virtual Visits (Telemedicine).  Patients are able to view lab/test results, encounter notes, upcoming appointments, etc.  Non-urgent messages can be sent to your provider as well.   To learn more about what you can do with MyChart, go to [email or web address].    Your next appointment:   6 month(s)  The format for your next appointment:   In Person  Provider:   [name], MD     Other Instructions Ventilation Perfusion Scan A ventilation-perfusion scan is a procedure to look at airflow and blood flow in the lungs. It is most often done to look for a blood clot that may have traveled to the lungs (pulmonary embolus). During this scan, radioactive compounds are injected into the bloodstream and are also breathed in. The compounds are not harmful. They are given at very  low doses and stay in the body for a short time. After the compounds are injected and breathed in, a camera is used to scan the lungs. Tell a health care provider about: Any allergies you have. All  medicines you are taking, including vitamins, herbs, eye drops, creams, and over-the-counter medicines. Any blood disorders you have. Any surgeries you have had. Any medical conditions you have. Whether you are pregnant or may be pregnant. Whether or not you are breastfeeding. What are the risks? Generally, this is a safe procedure. However, problems may occur, including an allergic reaction to the radioactive compounds. What happens before the procedure? Do not use any products that contain nicotine or tobacco before the procedure. These products include cigarettes, chewing tobacco, and vaping devices, such as e-cigarettes. If you need help quitting, ask your health care provider. Ask your health care provider about changing or stopping your regular medicines. This is especially important if you are taking diabetes medicines or blood thinners. What happens during the procedure? An IV will be inserted into one of your veins. The IV will stay in place for the entire exam. A small amount of radioactive material will be injected into your bloodstream. Your lungs will be scanned with a camera. This camera will record the images. You will be asked to inhale a second radioactive compound. Take deep breaths as directed by the health care provider. Your lungs will be scanned again. Your IV will be removed. The procedure may vary among health care providers and hospitals. What can I expect after the procedure? You may go home unless your health care provider instructs you differently. You may continue with your normal activities and diet as instructed by your health care provider. It is up to you to get the results of your procedure. Ask your health care provider, or the department that is doing the procedure, when your results will be ready. Summary A ventilation-perfusion scan is a procedure to look at airflow and blood flow in the lungs. It is most often done to look for blood clots that may have  traveled to the lungs. The radioactive compounds used during the procedure are not harmful. Your lungs will be scanned with a camera. This camera will record the images. It is up to you to get the results of your procedure. Ask your health care provider, or the department that is doing the procedure, when your results will be ready. This information is not intended to replace advice given to you by your health care provider. Make sure you discuss any questions you have with your health care provider. Document Revised: 08/09/2021 Document Reviewed: 10/28/2020 Elsevier Patient Education  [address]      Signed, [name], MD  01/26/2022 12:41 PM    [address], [place], [place]  [address] Phone: [phone]; Fax: [phone]

## 2022-01-26 NOTE — Addendum Note (Signed)
Addended by: Burnetta Sabin on: 01/26/2022 01:10 PM   Modules accepted: Orders

## 2022-01-31 ENCOUNTER — Encounter: Payer: Self-pay | Admitting: Physical Therapy

## 2022-01-31 ENCOUNTER — Ambulatory Visit: Payer: No Typology Code available for payment source | Admitting: Physical Therapy

## 2022-01-31 ENCOUNTER — Other Ambulatory Visit: Payer: Self-pay

## 2022-01-31 DIAGNOSIS — M25561 Pain in right knee: Secondary | ICD-10-CM

## 2022-01-31 DIAGNOSIS — M6281 Muscle weakness (generalized): Secondary | ICD-10-CM

## 2022-01-31 DIAGNOSIS — R262 Difficulty in walking, not elsewhere classified: Secondary | ICD-10-CM | POA: Diagnosis not present

## 2022-01-31 DIAGNOSIS — M25562 Pain in left knee: Secondary | ICD-10-CM

## 2022-01-31 DIAGNOSIS — G8929 Other chronic pain: Secondary | ICD-10-CM

## 2022-01-31 NOTE — Therapy (Signed)
[address] [place], [place], [address] Phone: [phone]   Fax:  [phone]  Physical Therapy Treatment  Patient Details  Name: Ryan Glass MRN: [id number] Date of Birth: 1956/05/12 Referring Provider (PT): Sherren Mocha   Encounter Date: 01/31/2022   PT End of Session - 01/31/22 1712     Visit Number 6    Number of Visits 18    Date for PT Re-Evaluation 02/14/22    PT Start Time 1630    PT Stop Time 1712    PT Time Calculation (min) 42 min    Activity Tolerance Patient tolerated treatment well;Patient limited by fatigue    Behavior During Therapy [facility] for tasks assessed/performed             Past Medical History:  Diagnosis Date   Ascending aortic aneurysm    a. CT 05/2021 showed 4.4x4.4cm TAA. Stable on chest CTA 12/2021 at 4.4cm.   Bilateral lower extremity edema    Celiac artery dilatation (HCC)    1.8cm at the celiac trunk on Chest CT 12/2021   Chronic gout without tophus    followed by dr Gerilyn Nestle    (06-08-2020  per pt last episode right knee 3 wks ago)   CKD (chronic kidney disease), stage II    nephrology--- Jen Mow PA (10-29-2019 note in epic scanned in  media)   Heart failure with mid-range ejection fraction ([place]) 05/27/2021   05/26/21: Left ventricular ejection fraction, by estimation, is 45 to 50%. There is severe asymmetric left ventricular hypertrophy. G1DD present.    History of COVID-19 06/07/2021   HOCM (hypertrophic obstructive cardiomyopathy) ([place])    s/p ICD 07/2021   Hypertension    followed by cardiology, dr t. turner   (05-14-2018 nuclear study in epic , normal perfusion with nuclear ef 61%)   Left hydrocele    Low serum potassium level 04/27/2014   OSA on CPAP    per pt uses every night   Palpitations followed by dr t. [name]   06-08-2020  still feels palipations due to PVCs when exertion but not with chest pain/ discomfort   Pneumonia due to COVID-19 virus     PVC's (premature ventricular contractions) cardiologist--- dr t. turner   Status post PVC ablation by [name] 2019 with recurrence of frequent PVCs/bigeminy;  prior pseudobradycardia r/t pvcs   Rheumatoid arthritis involving multiple sites [facility])    rheumotology--- dr a. Gerilyn Nestle  ([facility] in [place])    Past Surgical History:  Procedure Laterality Date   BIOPSY  10/08/2021   Procedure: BIOPSY;  Surgeon: Daryel November, MD;  Location: [facility];  Service: Gastroenterology;;   Kathleen Argue STUDY  08/18/2021   Procedure: BUBBLE STUDY;  Surgeon: Josue Hector, MD;  [facility]: [facility] ENDOSCOPY;  Service: Cardiovascular;;   ESOPHAGOGASTRODUODENOSCOPY N/A 10/08/2021   Procedure: ESOPHAGOGASTRODUODENOSCOPY (EGD);  Surgeon: Daryel November, MD;  Location: [facility];  Service: Gastroenterology;  Laterality: N/A;   HYDROCELE EXCISION Left 06/14/2020   Procedure: LEFT  HYDROCELECTOMY ADULT;  Surgeon: Robley Fries, MD;  Location: [facility];  Service: Urology;  Laterality: Left;   ICD IMPLANT N/A 08/07/2021   Procedure: ICD IMPLANT;  Surgeon: Evans Lance, MD;  Location: [place] CV LAB;  Service: Cardiovascular;  Laterality: N/A;   INCISIONAL HERNIA REPAIR  02-23-2016   @[facility]    LAPAROSCOPIC   LAPAROSCOPIC INGUINAL HERNIA REPAIR Bilateral 08-22-2015  @[facility]    AND UMBILICAL HERNIA REPAIR  PVC ABLATION N/A 10/07/2018   Procedure: PVC ABLATION;  Surgeon: Evans Lance, MD;  Location: [place] [facility];  Service: Cardiovascular;  Laterality: N/A;   RIGHT/LEFT HEART CATH AND CORONARY ANGIOGRAPHY N/A 05/29/2021   Procedure: RIGHT/LEFT HEART CATH AND CORONARY ANGIOGRAPHY;  Surgeon: Burnell Blanks, MD;  Location: [place] [facility];  Service: Cardiovascular;  Laterality: N/A;   TEE WITHOUT CARDIOVERSION N/A 08/18/2021   Procedure: TRANSESOPHAGEAL ECHOCARDIOGRAM (TEE);  Surgeon: Josue Hector, MD;  Location[facility] ENDOSCOPY;  Service: Cardiovascular;  Laterality: N/A;    UMBILICAL HERNIA REPAIR  child    There were no vitals filed for this visit.   Subjective Assessment - 01/31/22 1633     Subjective Patient reports that he has seen his Cardiologist, who made some adjustments to his pacemaker. He had been performing HEP, but backed off last week due to knee pain. He has an upcoming appointment iwht his Arthritis Dr on Friday.    Pertinent History RA, L ventricular hypertrophy, chronic desaturation, PVC with ICD implant 8/22,                               [facility] Adult PT Treatment/Exercise - 01/31/22 0001       Knee/Hip Exercises: Standing   Walking with Sports Cord 20# resistance 5 reps each direction. SATS 95%, HR 92, very SOB after each set. After third set his SATS dropped to 88%, provided extended recovery and short walking to increase them.                     PT Education - 01/31/22 1711     Education Details Try HEP to see which exercises bother his knees.    Methods Explanation    Comprehension Verbalized understanding              PT Short Term Goals - 01/08/22 1709       PT SHORT TERM GOAL #1   Title I with basic HEP    Status Achieved               PT Long Term Goals - 01/31/22 1802       PT LONG TERM GOAL #1   Title I with final HEP.    Time 3    Period Weeks    Status On-going    Target Date 02/14/22      PT LONG TERM GOAL #2   Title Patient will maintain SATS >89% at rest and during all activity.    Baseline More stable, but had 1 incident of SATS below 90%.    Time 3    Period Weeks    Status On-going    Target Date 02/14/22      PT LONG TERM GOAL #3   Title Patient will walk at least 15 minutes without having to stop, maintianing SATS >90%    Baseline 5.15 minutes    Time 6    Period Weeks    Status On-going    Target Date 02/14/22      PT LONG TERM GOAL #4   Title Patient will perform all daily functional activities with back pain < 3/10.    Baseline 3/10    Time  6    Period Weeks    Status Achieved    Target Date 02/14/22  Plan - 01/31/22 1639     Clinical Impression Statement Patient reports he was unable to schedule visits and this is why he has had a gap in his treamtent. He also has had increaed knee pain, has an appointment on Friday with his arthritis Dr. he participated well, continues to have the issue of his SATS dropping when sitting. At one point they stayed at 89 until he got up and took a 71' walk, after which his SATS were at 94%.    Personal Factors and Comorbidities Fitness;Past/Current Experience;Comorbidity 2    Comorbidities L ventricular hypertrophy, chronic desaturation.    Examination-Activity Limitations Locomotion Level;Reach Overhead;Squat;Carry;Lift;Stairs;Stand    Examination-Participation Restrictions Cleaning;Occupation;Yard Work    Conservation officer, historic buildings Evolving/Moderate complexity    Clinical Decision Making Moderate    Rehab Potential Good    PT Frequency 2x / week    PT Duration 8 weeks    PT Treatment/Interventions ADLs/Self Care Home Management;Electrical Stimulation;Neuromuscular re-education;Manual techniques;Energy conservation;Balance training;Therapeutic exercise;Therapeutic activities;Functional mobility training;Stair training;Gait training;Patient/family education    PT Next Visit Plan Update program, core stregnth.Asses to[place] to HEP and treatment activity from Wed.    PT Home Exercise Plan Tendon glide handout for hands.  [id number]    Consulted and Agree with Plan of Care Patient             Patient will benefit from skilled therapeutic intervention in order to improve the following deficits and impairments:  Difficulty walking, Cardiopulmonary status limiting activity, Decreased endurance, Decreased activity tolerance, Pain, Postural dysfunction, Decreased strength, Decreased mobility, Decreased balance, Improper body mechanics  Visit Diagnosis: Difficulty  in walking, not elsewhere classified  Muscle weakness (generalized)  Chronic pain of left knee  Chronic pain of right knee     Problem List Patient Active Problem List   Diagnosis Date Noted   PFO (patent foramen ovale) 12/28/2021   Asthma 12/26/2021   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   CAP (community acquired pneumonia) 10/13/2021   Dyspnea    Gastrointestinal hemorrhage    Acute respiratory failure with hypoxia (HCC) 10/07/2021   ABLA (acute blood loss anemia) 10/07/2021   History of pulmonary embolus (PE) 10/07/2021   AF (paroxysmal atrial fibrillation) (HCC) 10/07/2021   Hypertrophic cardiomyopathy (HCC) 08/15/2021   Ventricular tachycardia 08/02/2021   Ventricular tachycardia, unspecified 08/02/2021   Acute on chronic respiratory failure (HCC) 06/07/2021   History of COVID-19 06/07/2021   Rheumatoid arthritis involving multiple sites on Humira (HCC) 06/07/2021   On chronic prednisone therapy for Rheumatoid Arthritis 06/07/2021   CAD (coronary artery disease), non-obstructive 05/30/2021   Elevated troponin    Precordial chest pain    Heart failure with mid-range ejection fraction (HCC) 05/27/2021   LVH (left ventricular hypertrophy) due to hypertensive disease, with heart failure (HCC) 05/27/2021   Class 2 severe obesity with serious comorbidity and body mass index (BMI) of 35.0 to 35.9 in adult (HCC) 05/26/2021   Pulmonary embolism (HCC) 05/25/2021   Dyspnea on exertion    Thoracic aortic aneurysm without rupture 02/01/2021   Ascending aortic aneurysm 02/01/2021   Other hyperlipidemia 10/14/2020   Testicular swelling 05/14/2019   Pain in right knee 01/29/2019   PVC (premature ventricular contraction) 10/07/2018   Ventricular premature depolarization 10/07/2018   Dilated aortic root (HCC)    Allergic rhinitis 01/10/2016   Chronic gout without tophus 01/10/2016   Drug therapy 01/10/2016   OSA (obstructive sleep apnea) 01/10/2016   Colonic polyp  12/14/2015   Rt groin pain 12/14/2015   Scoliosis  of thoracolumbar spine 12/14/2015   Right lower quadrant abdominal pain 04/20/2015   Arthritis of knee 02/22/2015   Edema of extremities 02/22/2015   Essential hypertension 02/22/2015   Preventative health care 02/22/2015   Bradycardia by electrocardiogram 04/16/2014    Marcelina Morel, DPT 01/31/2022, 6:04 PM  [address] [place], [place], [address] Phone: [phone]   Fax:  [phone]  Name: Ryan Glass MRN: [id number] Date of Birth: [birth year]

## 2022-02-01 ENCOUNTER — Encounter: Payer: Self-pay | Admitting: *Deleted

## 2022-02-01 ENCOUNTER — Telehealth: Payer: Self-pay | Admitting: *Deleted

## 2022-02-01 DIAGNOSIS — M25561 Pain in right knee: Secondary | ICD-10-CM

## 2022-02-01 DIAGNOSIS — G8929 Other chronic pain: Secondary | ICD-10-CM

## 2022-02-01 DIAGNOSIS — R0609 Other forms of dyspnea: Secondary | ICD-10-CM

## 2022-02-01 DIAGNOSIS — Z8719 Personal history of other diseases of the digestive system: Secondary | ICD-10-CM

## 2022-02-01 NOTE — Telephone Encounter (Signed)
Saw pt in workcenter. He reports feeling pretty well currently. Saw [facility] for second opinion. They felt the ICD pocket looked good, no concerns there. They did question why pt's PM settings are set to keep HR at 80 all the time. Sts pacing between 78-98% of the time. Already used 30% of ICD generator battery life. [facility] not changing settings unless pt decides to move to them for full Cards and EP care. Pt debating moving to [facility] due to location and not able to see pt if admitted to [place] pt he could send MyChart mesg to Dr. Tanna Furry office asking about pacing settings and make more decisions based on their response. He also is considering moving Cards provider, even if not to [facility], but to someone else within [facility] office r/t Dr. [name] bedside manner/charting throughout appts and not talking to him much.  Arthritis flaring in knees bad currently. Able to get arthritis MD April appt moved to tomorrow. Coworker suggested a topical lotion to pt for arthritis and he reports it is helping but wants to make sure no interaction with other meds like Diclofenac gel. "[place] Dream Arthritis Pain Relief Cream". Histamine Dihydrochloride 0.025% Topical analgesic. Discussed with NP Otila Kluver. Unable to locate active ingredient in [place]TD. Will continue researching.

## 2022-02-01 NOTE — Telephone Encounter (Signed)
Spoke with patient in workcenter as RN [name] had notified me patient had bought OTC topical cream for knee pain and wanted me to review ingredients to ensure no contraindications with his other medications.  None noted and discussed with patient if helping he can continue use at this time.  Patient stated he was given new Rx from rheumatology for diclofenac gel but when he went to pick up from the pharmacy noted he should not take if GI bleed.  Discussed with patient yes the gel could cause GI bleed and since he was recently hospitalized for GI bleed I do not recommend taking diclofenac pills or gel at this time.  Patient is going to hold diclofenac and continue [place] dream cream it has been helping.  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  Knees are without rash/bruising or erythema.  Trace pedal edema left at sock line and 1+/4 pitting right leg at sock line.  Patient met with [facility] cardiology last week for second opinion and he has 1 last appt for echo with bubble test.  Due to long distance from their office and he typically is only admitted to [facility] or [facility] system patient plans to continue with [facility] team. Patient reported his daughter would like for him to see [facility] Cardiology team. Patient stated [facility] cardiology recommended adjusting pacemaker settings to lower baseline heart rate and to allow heart rate to adjust when exerting as that may help his dyspnea with exertion/fatigue symptoms.  Patient recommended to discuss with his [facility] provider via my chart to see if another appt needs to be made as he is currently in cardiac and pulmonary rehab therapy.  Patient heart rate history reviewed in paper chart at [facility] clinic and prior to pacemaker insertion typically patient 60-70 and rarely in low 80s on his office visits from 2012-present.  Only in the past year has  he been bradycardic on a regular basis 40-50s.  Patient AICD pocket with minimal swelling today no erythema or TTP.  Patient concerned battery already down 30% per [facility] cardiologist and has not been in place one year yet.  Patient A&Ox3 spoke full sentences without difficulty skin warm dry and pink no cough/congestion/shortness of breath/wheezing audible.  Discussed with patient I would review [facility] notes this evening and follow up with him if anything we didn't discuss or that I needed to review with him.  Encouraged patient to keep rheumatology appt with his provider tomorrow.  Patient verbalized understanding information/instructions, agreed with plan of care and had no further questions at this time.  Reviewed RN [name] note and agreed with plan of care.

## 2022-02-05 ENCOUNTER — Encounter: Payer: Self-pay | Admitting: Physical Therapy

## 2022-02-05 ENCOUNTER — Other Ambulatory Visit: Payer: Self-pay

## 2022-02-05 ENCOUNTER — Ambulatory Visit: Payer: No Typology Code available for payment source | Admitting: Physical Therapy

## 2022-02-05 DIAGNOSIS — R262 Difficulty in walking, not elsewhere classified: Secondary | ICD-10-CM | POA: Diagnosis not present

## 2022-02-05 DIAGNOSIS — G8929 Other chronic pain: Secondary | ICD-10-CM

## 2022-02-05 DIAGNOSIS — M6281 Muscle weakness (generalized): Secondary | ICD-10-CM

## 2022-02-05 NOTE — Therapy (Signed)
[facility]. [place], [place], [address] Phone: [phone]   Fax:  [phone]  Physical Therapy Treatment  Patient Details  Name: Ryan Glass MRN: [id number] Date of Birth: [birth year] Referring Provider (PT): Sherren Mocha   Encounter Date: 02/05/2022   PT End of Session - 02/05/22 1708     Visit Number 7    Date for PT Re-Evaluation 02/14/22    PT Start Time 1632    PT Stop Time 1710    PT Time Calculation (min) 38 min    Activity Tolerance Patient tolerated treatment well;Patient limited by fatigue    Behavior During Therapy [facility] for tasks assessed/performed             Past Medical History:  Diagnosis Date   Ascending aortic aneurysm    a. CT 05/2021 showed 4.4x4.4cm TAA. Stable on chest CTA 12/2021 at 4.4cm.   Bilateral lower extremity edema    Celiac artery dilatation (HCC)    1.8cm at the celiac trunk on Chest CT 12/2021   Chronic gout without tophus    followed by dr Gerilyn Nestle    (06-08-2020  per pt last episode right knee 3 wks ago)   CKD (chronic kidney disease), stage II    nephrology--- Jen Mow PA (10-29-2019 note in epic scanned in  media)   Heart failure with mid-range ejection fraction ([place]) 05/27/2021   05/26/21: Left ventricular ejection fraction, by estimation, is 45 to 50%. There is severe asymmetric left ventricular hypertrophy. G1DD present.    History of COVID-19 06/07/2021   HOCM (hypertrophic obstructive cardiomyopathy) ([place])    s/p ICD 07/2021   Hypertension    followed by cardiology, dr t. turner   (05-14-2018 nuclear study in epic , normal perfusion with nuclear ef 61%)   Left hydrocele    Low serum potassium level 04/27/2014   OSA on CPAP    per pt uses every night   Palpitations followed by dr t. [name]   06-08-2020  still feels palipations due to PVCs when exertion but not with chest pain/ discomfort   Pneumonia due to COVID-19 virus    PVC's (premature  ventricular contractions) cardiologist--- dr t. turner   Status post PVC ablation by [name] 2019 with recurrence of frequent PVCs/bigeminy;  prior pseudobradycardia r/t pvcs   Rheumatoid arthritis involving multiple sites [facility])    rheumotology--- dr a. Gerilyn Nestle  ([facility] in [place])    Past Surgical History:  Procedure Laterality Date   BIOPSY  10/08/2021   Procedure: BIOPSY;  Surgeon: Daryel November, MD;  Location: [place];  Service: Gastroenterology;;   Kathleen Argue STUDY  08/18/2021   Procedure: BUBBLE STUDY;  Surgeon: Josue Hector, MD;  Location: [facility] ENDOSCOPY;  Service: Cardiovascular;;   ESOPHAGOGASTRODUODENOSCOPY N/A 10/08/2021   Procedure: ESOPHAGOGASTRODUODENOSCOPY (EGD);  Surgeon: Daryel November, MD;  Location: [place];  Service: Gastroenterology;  Laterality: N/A;   HYDROCELE EXCISION Left 06/14/2020   Procedure: LEFT  HYDROCELECTOMY ADULT;  Surgeon: Robley Fries, MD;  Location[facility];  Service: Urology;  Laterality: Left;   ICD IMPLANT N/A 08/07/2021   Procedure: ICD IMPLANT;  Surgeon: Evans Lance, MD;  Location: [place] CV LAB;  Service: Cardiovascular;  Laterality: N/A;   INCISIONAL HERNIA REPAIR  02-23-2016   @[facility]    LAPAROSCOPIC   LAPAROSCOPIC INGUINAL HERNIA REPAIR Bilateral 08-22-2015  @[facility]    AND UMBILICAL HERNIA REPAIR   PVC ABLATION N/A 10/07/2018   Procedure:  PVC ABLATION;  Surgeon: Evans Lance, MD;  Location: [place] CV LAB;  Service: Cardiovascular;  Laterality: N/A;   RIGHT/LEFT HEART CATH AND CORONARY ANGIOGRAPHY N/A 05/29/2021   Procedure: RIGHT/LEFT HEART CATH AND CORONARY ANGIOGRAPHY;  Surgeon: Burnell Blanks, MD;  Location: [place] CV LAB;  Service: Cardiovascular;  Laterality: N/A;   TEE WITHOUT CARDIOVERSION N/A 08/18/2021   Procedure: TRANSESOPHAGEAL ECHOCARDIOGRAM (TEE);  Surgeon: Josue Hector, MD;  Location: [facility] ENDOSCOPY;  Service: Cardiovascular;  Laterality: N/A;   UMBILICAL HERNIA REPAIR   child    There were no vitals filed for this visit.   Subjective Assessment - 02/05/22 1636     Subjective Patient reports he had B knee Xrays-reveal B knee arthritis. He also reports fatigue today, no more than usual.    Pertinent History RA, L ventricular hypertrophy, chronic desaturation, PVC with ICD implant 8/22,    Currently in Pain? Yes    Pain Score 5     Pain Location Knee    Pain Orientation Left;Right    Pain Descriptors / Indicators Aching    Pain Type Chronic pain    Pain Onset More than a month ago    Pain Frequency Intermittent    Pain Relieving Factors Tylenol, gel                               [facility] Adult PT Treatment/Exercise - 02/05/22 0001       Knee/Hip Exercises: Aerobic   Nustep L5 x 6 minutes      Knee/Hip Exercises: Machines for Strengthening   Cybex Knee Flexion 35# 2 x 15      Knee/Hip Exercises: Standing   Terminal Knee Extension Strengthening;Right;2 sets;10 reps    Terminal Knee Extension Limitations Ball resistance    Hip Abduction Stengthening;Both;2 sets;10 reps    Abduction Limitations G Tband    Lateral Step Up Left;1 set;10 reps;Hand Hold: 0;Step Height: 6"   Attempted with R LE, but it was too painful in knee                    PT Education - 02/05/22 1646     Education Details Benefits of strenghtening hips and knees to improve knee stability.    Person(s) Educated Patient    Methods Explanation;Demonstration    Comprehension Verbalized understanding;Returned demonstration              PT Short Term Goals - 01/08/22 1709       PT SHORT TERM GOAL #1   Title I with basic HEP    Status Achieved               PT Long Term Goals - 01/31/22 1802       PT LONG TERM GOAL #1   Title I with final HEP.    Time 3    Period Weeks    Status On-going    Target Date 02/14/22      PT LONG TERM GOAL #2   Title Patient will maintain SATS >89% at rest and during all activity.    Baseline  More stable, but had 1 incident of SATS below 90%.    Time 3    Period Weeks    Status On-going    Target Date 02/14/22      PT LONG TERM GOAL #3   Title Patient will walk at least 15 minutes without having to stop,  maintianing SATS >90%    Baseline 5.15 minutes    Time 6    Period Weeks    Status On-going    Target Date 02/14/22      PT LONG TERM GOAL #4   Title Patient will perform all daily functional activities with back pain < 3/10.    Baseline 3/10    Time 6    Period Weeks    Status Achieved    Target Date 02/14/22                   Plan - 02/05/22 1648     Clinical Impression Statement Patient had Xrays, which revealed B knee arthritis. He has an appointment iwth his Dr to review. Perofrmed B LE strengthening activities, tolerated well.    Personal Factors and Comorbidities Fitness;Past/Current Experience;Comorbidity 2    Comorbidities L ventricular hypertrophy, chronic desaturation.    Examination-Activity Limitations Locomotion Level;Reach Overhead;Squat;Carry;Lift;Stairs;Stand    Examination-Participation Restrictions Cleaning;Occupation;Yard Work    [employer] Evolving/Moderate complexity    Clinical Decision Making Moderate    Rehab Potential Good    PT Frequency 2x / week    PT Duration 8 weeks    PT Treatment/Interventions ADLs/Self Care Home Management;Electrical Stimulation;Neuromuscular re-education;Manual techniques;Energy conservation;Balance training;Therapeutic exercise;Therapeutic activities;Functional mobility training;Stair training;Gait training;Patient/family education    PT Next Visit Plan Re-assess-possibly hold treatment as he will be beginning pulmonary rehab next week.    PT Home Exercise Plan [id number]    Consulted and Agree with Plan of Care Patient             Patient will benefit from skilled therapeutic intervention in order to improve the following deficits and impairments:  Difficulty walking,  Cardiopulmonary status limiting activity, Decreased endurance, Decreased activity tolerance, Pain, Postural dysfunction, Decreased strength, Decreased mobility, Decreased balance, Improper body mechanics  Visit Diagnosis: Difficulty in walking, not elsewhere classified  Muscle weakness (generalized)  Chronic pain of left knee  Chronic pain of right knee     Problem List Patient Active Problem List   Diagnosis Date Noted   PFO (patent foramen ovale) 12/28/2021   Asthma 12/26/2021   Celiac artery dilatation ([place]) 12/17/2021   CAP (community acquired pneumonia) 10/13/2021   Dyspnea    Gastrointestinal hemorrhage    Acute respiratory failure with hypoxia ([place]) 10/07/2021   ABLA (acute blood loss anemia) 10/07/2021   History of pulmonary embolus ([place]) 10/07/2021   AF (paroxysmal atrial fibrillation) ([place]) 10/07/2021   Hypertrophic cardiomyopathy ([place]) 08/15/2021   Ventricular tachycardia 08/02/2021   Ventricular tachycardia, unspecified 08/02/2021   Acute on chronic respiratory failure ([place]) 06/07/2021   History of COVID-19 06/07/2021   Rheumatoid arthritis involving multiple sites on Humira ([place]) 06/07/2021   On chronic prednisone therapy for Rheumatoid Arthritis 06/07/2021   CAD (coronary artery disease), non-obstructive 05/30/2021   Elevated troponin    Precordial chest pain    Heart failure with mid-range ejection fraction ([place]) 05/27/2021   LVH (left ventricular hypertrophy) due to hypertensive disease, with heart failure ([place]) 05/27/2021   Class 2 severe obesity with serious comorbidity and body mass index (BMI) of 35.0 to 35.9 in adult [facility]) 05/26/2021   Pulmonary embolism ([place]) 05/25/2021   Dyspnea on exertion    Thoracic aortic aneurysm without rupture 02/01/2021   Ascending aortic aneurysm 02/01/2021   Other hyperlipidemia 10/14/2020   Testicular swelling 05/14/2019   Pain in right knee 01/29/2019   PVC (premature ventricular contraction) 10/07/2018   Ventricular  premature depolarization 10/07/2018  Dilated aortic root (HCC)    Allergic rhinitis 01/10/2016   Chronic gout without tophus 01/10/2016   Drug therapy 01/10/2016   OSA (obstructive sleep apnea) 01/10/2016   Colonic polyp 12/14/2015   Rt groin pain 12/14/2015   Scoliosis of thoracolumbar spine 12/14/2015   Right lower quadrant abdominal pain 04/20/2015   Arthritis of knee 02/22/2015   Edema of extremities 02/22/2015   Essential hypertension 02/22/2015   Preventative health care 02/22/2015   Bradycardia by electrocardiogram 04/16/2014    Marcelina Morel, DPT 02/05/2022, 5:10 PM  [place]. [place], [place], [address] Phone: [phone]   Fax:  [phone]  Name: EZE TONTHAT MRN: [id number] Date of Birth: Jan 22, 1956

## 2022-02-06 ENCOUNTER — Other Ambulatory Visit: Payer: Self-pay | Admitting: Cardiology

## 2022-02-06 ENCOUNTER — Ambulatory Visit (HOSPITAL_COMMUNITY)
Admission: RE | Admit: 2022-02-06 | Discharge: 2022-02-06 | Disposition: A | Discharge status: Home / Self Care | Payer: No Typology Code available for payment source | Source: Ambulatory Visit | Attending: Cardiology | Admitting: Cardiology

## 2022-02-06 DIAGNOSIS — I10 Essential (primary) hypertension: Secondary | ICD-10-CM

## 2022-02-06 DIAGNOSIS — R0609 Other forms of dyspnea: Secondary | ICD-10-CM

## 2022-02-06 DIAGNOSIS — I472 Ventricular tachycardia, unspecified: Secondary | ICD-10-CM

## 2022-02-06 DIAGNOSIS — I7121 Aneurysm of the ascending aorta, without rupture: Secondary | ICD-10-CM

## 2022-02-06 DIAGNOSIS — I2782 Chronic pulmonary embolism: Secondary | ICD-10-CM

## 2022-02-06 DIAGNOSIS — Q2112 Patent foramen ovale: Secondary | ICD-10-CM

## 2022-02-06 DIAGNOSIS — I421 Obstructive hypertrophic cardiomyopathy: Secondary | ICD-10-CM

## 2022-02-06 DIAGNOSIS — I5032 Chronic diastolic (congestive) heart failure: Secondary | ICD-10-CM | POA: Diagnosis present

## 2022-02-06 DIAGNOSIS — I2510 Atherosclerotic heart disease of native coronary artery without angina pectoris: Secondary | ICD-10-CM

## 2022-02-06 IMAGING — DX DG CHEST 2V
2 series · 2 of 2 positions shown · non-contrast
Comparison: [DATE]

CLINICAL DATA: Shortness of breath

EXAM:
CHEST - 2 VIEW

[chest pa]
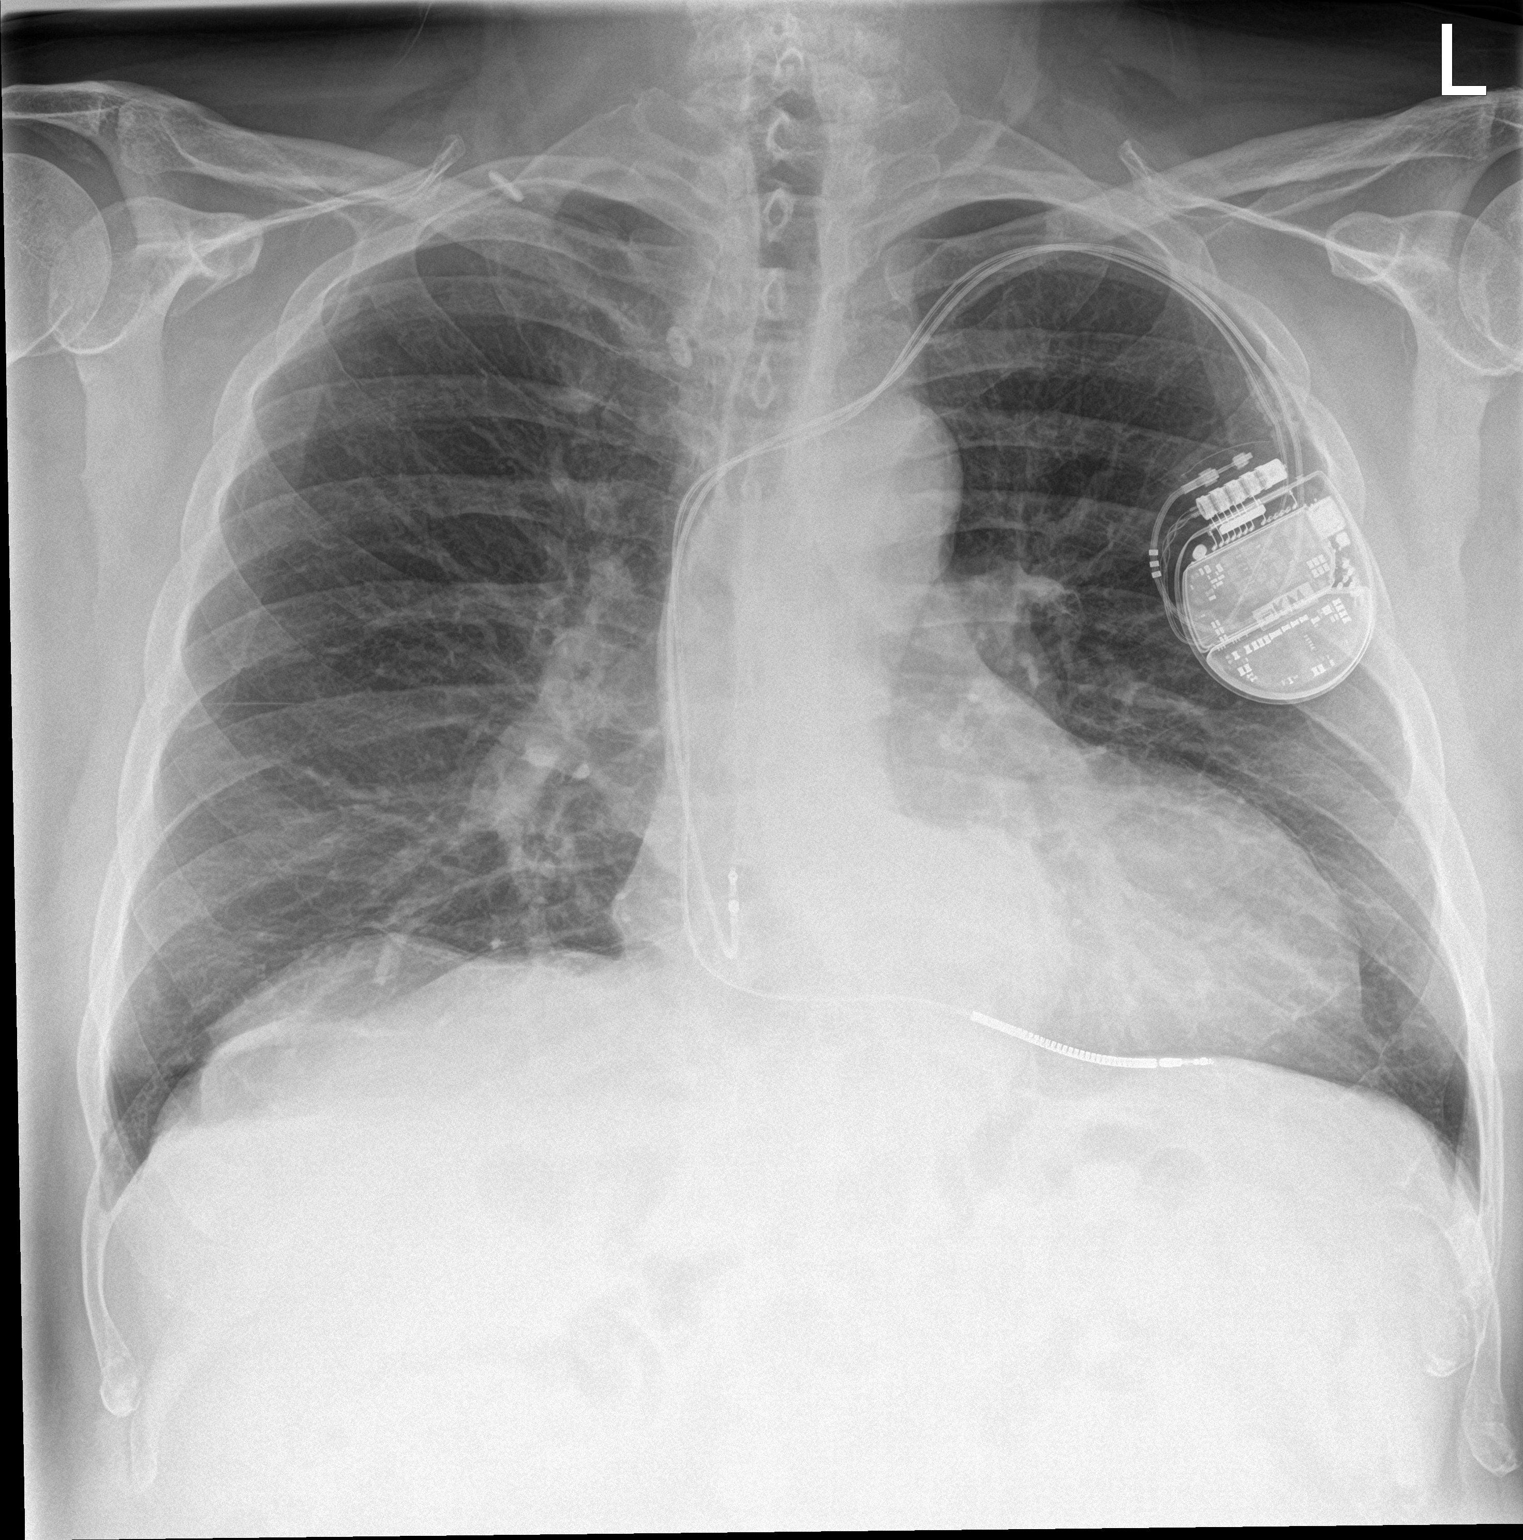

[chest lat]
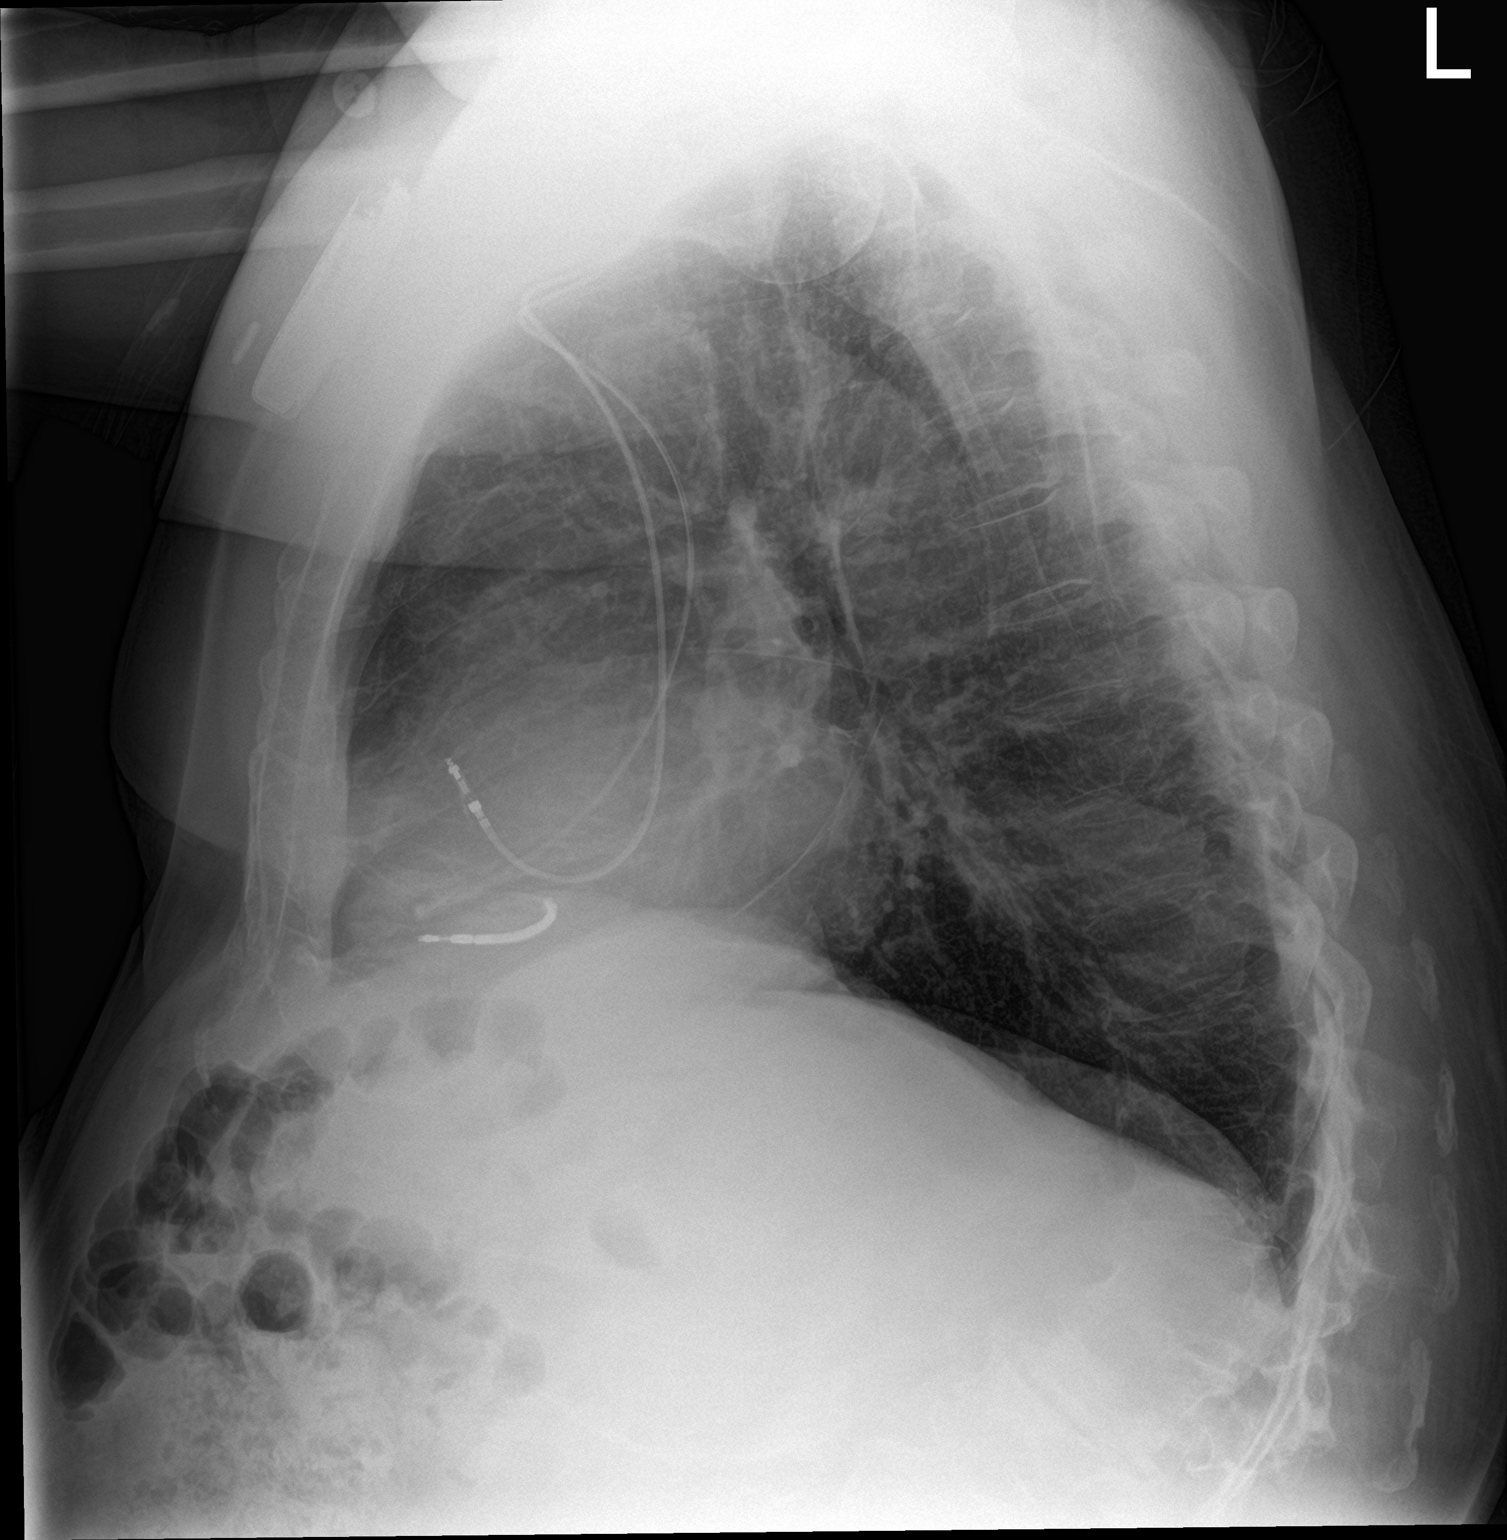

[2 of 2 positions shown; findings below may reference images not displayed]

FINDINGS: Left AICD remains in place, unchanged. Heart is upper limits normal
in size. Lungs clear. No effusions or acute bony abnormality.
IMPRESSION: No active cardiopulmonary disease.

## 2022-02-06 IMAGING — NM NM PULMONARY PERF PARTICULATE
8 series · 8 of 8 positions shown · non-contrast
Comparison: None.

CLINICAL DATA: Chest pain.

EXAM:
NUCLEAR MEDICINE PERFUSION LUNG SCAN
TECHNIQUE: Perfusion images were obtained in multiple projections after
intravenous injection of radiopharmaceutical.
Ventilation scans intentionally deferred if perfusion scan and chest
x-ray adequate for interpretation during COVID 19 epidemic.
RADIOPHARMACEUTICALS:  4.2 mCi [GH] MAA IV

[Series 1: ant/post perf · 4.14mm/px · 1 of 1 slices shown (1 of 2)]
[im 1/1]
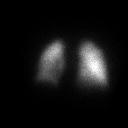

[Series 1: ant/post perf · 4.14mm/px · 1 of 1 slices shown (2 of 2)]
[im 1/1]
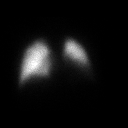

[Series 2: lao/rpo perf · 4.14mm/px · 1 of 1 slices shown (1 of 2)]
[im 1/1]
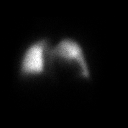

[Series 2: lao/rpo perf · 4.14mm/px · 1 of 1 slices shown (2 of 2)]
[im 1/1]
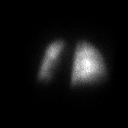

[Series 3: lpo/rao perf · 4.14mm/px · 1 of 1 slices shown (1 of 2)]
[im 1/1]
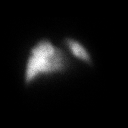

[Series 3: lpo/rao perf · 4.14mm/px · 1 of 1 slices shown (2 of 2)]
[im 1/1]
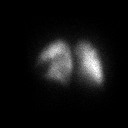

[Series 4: lt lat/rt lat perf · 4.14mm/px · 1 of 1 slices shown (1 of 2)]
[im 1/1]
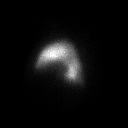

[Series 4: lt lat/rt lat perf · 4.14mm/px · 1 of 1 slices shown (2 of 2)]
[im 1/1]
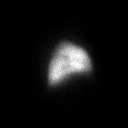

[8 of 8 positions shown; findings below may reference images not displayed]

FINDINGS: Normal homogeneous distribution of tracer activity is seen
throughout both lungs without focal, segmental or subsegmental
perfusion defects.
IMPRESSION: Normal nuclear medicine perfusion lung scan.

## 2022-02-06 MED ORDER — TECHNETIUM TO 99M ALBUMIN AGGREGATED
4.2000 | Freq: Once | INTRAVENOUS | Status: AC | PRN
  Administered 2022-02-06: 4.2 via INTRAVENOUS

## 2022-02-06 NOTE — Telephone Encounter (Signed)
Patient to clinic today asking if I had time to review [facility] office note.  Discussed I had and some questions regarding his histogram findings and pending follow up call with cardiology.  Patient has VQ scan scheduled [facility] at [facility] ordered by Dr [name].  Patient asked if he should keep appt and I encouraged him to complete test as may give answer to his shortness of breath with exertion if heart or lung related question.  Patient stated he has appt this afternoon and will keep.  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  Sp02 97% RA speaking in short phrases.  Did his inhalers this am.  BBS bases diminished; no rales/rhonchi/wheezing auscultated or audible.  No pedal edema on exam.  Skin warm dry and pink gait sure and steady in clinic A&Ox3.  Patient reported weight in his good range.  Patient verbalized understanding information/instructions, agreed with plan of care and had no further questions at this time.

## 2022-02-07 ENCOUNTER — Other Ambulatory Visit: Payer: Self-pay

## 2022-02-07 ENCOUNTER — Encounter: Payer: Self-pay | Admitting: Physical Therapy

## 2022-02-07 ENCOUNTER — Telehealth (HOSPITAL_COMMUNITY): Payer: Self-pay | Admitting: *Deleted

## 2022-02-07 ENCOUNTER — Ambulatory Visit (INDEPENDENT_AMBULATORY_CARE_PROVIDER_SITE_OTHER): Payer: No Typology Code available for payment source

## 2022-02-07 ENCOUNTER — Ambulatory Visit: Payer: No Typology Code available for payment source | Attending: Internal Medicine | Admitting: Physical Therapy

## 2022-02-07 DIAGNOSIS — M6281 Muscle weakness (generalized): Secondary | ICD-10-CM | POA: Diagnosis present

## 2022-02-07 DIAGNOSIS — R262 Difficulty in walking, not elsewhere classified: Secondary | ICD-10-CM | POA: Diagnosis not present

## 2022-02-07 DIAGNOSIS — M25562 Pain in left knee: Secondary | ICD-10-CM | POA: Insufficient documentation

## 2022-02-07 DIAGNOSIS — M25561 Pain in right knee: Secondary | ICD-10-CM | POA: Insufficient documentation

## 2022-02-07 DIAGNOSIS — I421 Obstructive hypertrophic cardiomyopathy: Secondary | ICD-10-CM

## 2022-02-07 DIAGNOSIS — G8929 Other chronic pain: Secondary | ICD-10-CM | POA: Diagnosis present

## 2022-02-07 LAB — CUP PACEART REMOTE DEVICE CHECK
Battery Remaining Longevity: 77 mo
Battery Remaining Percentage: 89 %
Battery Voltage: 2.99 V
Brady Statistic AP VP Percent: 58 %
Brady Statistic AP VS Percent: 41 %
Brady Statistic AS VP Percent: 1 %
Brady Statistic AS VS Percent: 1 %
Brady Statistic RA Percent Paced: 99 %
Brady Statistic RV Percent Paced: 58 %
Date Time Interrogation Session: 20230301022817
HighPow Impedance: 63 Ohm
Implantable Lead Implant Date: 20220829
Implantable Lead Implant Date: 20220829
Implantable Lead Location: 753859
Implantable Lead Location: 753860
Implantable Pulse Generator Implant Date: 20220829
Lead Channel Impedance Value: 380 Ohm
Lead Channel Impedance Value: 430 Ohm
Lead Channel Pacing Threshold Amplitude: 0.75 V
Lead Channel Pacing Threshold Amplitude: 0.75 V
Lead Channel Pacing Threshold Pulse Width: 0.5 ms
Lead Channel Pacing Threshold Pulse Width: 0.5 ms
Lead Channel Sensing Intrinsic Amplitude: 12 mV
Lead Channel Sensing Intrinsic Amplitude: 3.8 mV
Lead Channel Setting Pacing Amplitude: 2 V
Lead Channel Setting Pacing Amplitude: 2.5 V
Lead Channel Setting Pacing Pulse Width: 0.5 ms
Lead Channel Setting Sensing Sensitivity: 0.5 mV
Pulse Gen Serial Number: 810029878

## 2022-02-07 NOTE — Therapy (Signed)
[place] [place], [place], [address] Phone: [phone]   Fax:  [phone]  Physical Therapy Treatment  Patient Details  Name: Ryan Glass MRN: [id number] Date of Birth: Jul 13, 1956 Referring Provider (PT): Sherren Mocha   Encounter Date: 02/07/2022   PT End of Session - 02/07/22 1713     Visit Number 8    Date for PT Re-Evaluation 02/14/22    PT Start Time 1631    PT Stop Time 1710    PT Time Calculation (min) 39 min    Activity Tolerance Patient tolerated treatment well;Patient limited by fatigue    Behavior During Therapy [facility] for tasks assessed/performed             Past Medical History:  Diagnosis Date   Ascending aortic aneurysm    a. CT 05/2021 showed 4.4x4.4cm TAA. Stable on chest CTA 12/2021 at 4.4cm.   Bilateral lower extremity edema    Celiac artery dilatation (HCC)    1.8cm at the celiac trunk on Chest CT 12/2021   Chronic gout without tophus    followed by dr Gerilyn Nestle    (06-08-2020  per pt last episode right knee 3 wks ago)   CKD (chronic kidney disease), stage II    nephrology--- Jen Mow PA (10-29-2019 note in epic scanned in  media)   Heart failure with mid-range ejection fraction ([place]) 05/27/2021   05/26/21: Left ventricular ejection fraction, by estimation, is 45 to 50%. There is severe asymmetric left ventricular hypertrophy. G1DD present.    History of COVID-19 06/07/2021   HOCM (hypertrophic obstructive cardiomyopathy) ([place])    s/p ICD 07/2021   Hypertension    followed by cardiology, dr t. turner   (05-14-2018 nuclear study in epic , normal perfusion with nuclear ef 61%)   Left hydrocele    Low serum potassium level 04/27/2014   OSA on CPAP    per pt uses every night   Palpitations followed by dr t. [name]   06-08-2020  still feels palipations due to PVCs when exertion but not with chest pain/ discomfort   Pneumonia due to COVID-19 virus    PVC's (premature  ventricular contractions) cardiologist--- dr t. turner   Status post PVC ablation by [name] 2019 with recurrence of frequent PVCs/bigeminy;  prior pseudobradycardia r/t pvcs   Rheumatoid arthritis involving multiple sites [facility])    rheumotology--- dr a. Gerilyn Nestle  ([facility] in [place])    Past Surgical History:  Procedure Laterality Date   BIOPSY  10/08/2021   Procedure: BIOPSY;  Surgeon: Daryel November, MD;  Location: [facility];  Service: Gastroenterology;;   Kathleen Argue STUDY  08/18/2021   Procedure: BUBBLE STUDY;  Surgeon: Josue Hector, MD;  Location: [facility] ENDOSCOPY;  Service: Cardiovascular;;   ESOPHAGOGASTRODUODENOSCOPY N/A 10/08/2021   Procedure: ESOPHAGOGASTRODUODENOSCOPY (EGD);  Surgeon: Daryel November, MD;  Location: [facility];  Service: Gastroenterology;  Laterality: N/A;   HYDROCELE EXCISION Left 06/14/2020   Procedure: LEFT  HYDROCELECTOMY ADULT;  Surgeon: Robley Fries, MD;  Location: [facility];  Service: Urology;  Laterality: Left;   ICD IMPLANT N/A 08/07/2021   Procedure: ICD IMPLANT;  Surgeon: Evans Lance, MD;  Location: [facility] CV LAB;  Service: Cardiovascular;  Laterality: N/A;   INCISIONAL HERNIA REPAIR  02-23-2016   @[facility]    LAPAROSCOPIC   LAPAROSCOPIC INGUINAL HERNIA REPAIR Bilateral 08-22-2015  @[facility]    AND UMBILICAL HERNIA REPAIR   PVC ABLATION N/A 10/07/2018   Procedure:  PVC ABLATION;  Surgeon: Evans Lance, MD;  Location: [place] CV LAB;  Service: Cardiovascular;  Laterality: N/A;   RIGHT/LEFT HEART CATH AND CORONARY ANGIOGRAPHY N/A 05/29/2021   Procedure: RIGHT/LEFT HEART CATH AND CORONARY ANGIOGRAPHY;  Surgeon: Burnell Blanks, MD;  Location: [place] CV LAB;  Service: Cardiovascular;  Laterality: N/A;   TEE WITHOUT CARDIOVERSION N/A 08/18/2021   Procedure: TRANSESOPHAGEAL ECHOCARDIOGRAM (TEE);  Surgeon: Josue Hector, MD;  Location: [facility] ENDOSCOPY;  Service: Cardiovascular;  Laterality: N/A;   UMBILICAL HERNIA REPAIR   child    There were no vitals filed for this visit.   Subjective Assessment - 02/07/22 1638     Subjective Patient to start Pulmonary Rehab next week. Plan to hold PT while he assesses his tolerance and he will call back to schedule visits if/when he feels ready to resume. he reports that his knees ar esore today.    Pertinent History RA, L ventricular hypertrophy, chronic desaturation, PVC with ICD implant 8/22,    Limitations Walking    Currently in Pain? Yes    Pain Score 2     Pain Location Knee    Pain Orientation Left    Pain Descriptors / Indicators Aching    Pain Type Chronic pain    Pain Onset More than a month ago    Pain Frequency Intermittent                               [facility] Adult PT Treatment/Exercise - 02/07/22 0001       Knee/Hip Exercises: Aerobic   Elliptical L1 x 4 minutes    Nustep L5 x 6 minutes      Knee/Hip Exercises: Standing   Forward Step Up Both;1 set;10 reps    Forward Step Up Limitations Onto Air Ex pad, drive opposite knee up, UUE support on post.    Other Standing Knee Exercises Monster walks iwth Red Tband resistance, 20 steps forward and back    Other Standing Knee Exercises Alternating taps on 3" steps, quick feet x 30 seconds. No UE support, no unsteadiness noted.                     PT Education - 02/07/22 1712     Education Details Patient is starting REspiratory therapy next week. Plan to hold PT until he determinesif he can handle both activities. he will call back to schedule more visits if needed. Educated to safe use of elliptical as he has one at home.    Person(s) Educated Patient    Methods Explanation    Comprehension Verbalized understanding              PT Short Term Goals - 01/08/22 1709       PT SHORT TERM GOAL #1   Title I with basic HEP    Status Achieved               PT Long Term Goals - 02/07/22 1716       PT LONG TERM GOAL #1   Title I with final HEP.    Time 3     Period Weeks    Status On-going    Target Date 02/14/22      PT LONG TERM GOAL #2   Title Patient will maintain SATS >89% at rest and during all activity.    Baseline More stable, but had 1 incident of SATS below  90%.    Time 3    Period Weeks    Status Achieved    Target Date 02/14/22      PT LONG TERM GOAL #3   Title Patient will walk at least 15 minutes without having to stop, maintianing SATS >90%    Baseline 5.15 minutes    Time 6    Period Weeks    Status On-going    Target Date 02/14/22      PT LONG TERM GOAL #4   Title Patient will perform all daily functional activities with back pain < 3/10.    Baseline 3/10    Time 6    Period Weeks    Status Achieved    Target Date 02/14/22                   Plan - 02/07/22 1713     Clinical Impression Statement Still no results from xrays. Patient is starting Pulmonary Rehab next week and plans to hold PT for a week or 2 until he determines how he can handle both activities. He verbalized understanding that he needs to call back and schedule appointments. He reported L knee pain today, 2/10. He tolerated all activities well, modified to minimize strain on B knees.    Personal Factors and Comorbidities Fitness;Past/Current Experience;Comorbidity 2    Comorbidities L ventricular hypertrophy, chronic desaturation.    Examination-Activity Limitations Locomotion Level;Reach Overhead;Squat;Carry;Lift;Stairs;Stand    Examination-Participation Restrictions Cleaning;Occupation;Yard Work    [employer] Evolving/Moderate complexity    Clinical Decision Making Moderate    Rehab Potential Good    PT Frequency 2x / week    PT Duration 8 weeks    PT Treatment/Interventions ADLs/Self Care Home Management;Electrical Stimulation;Neuromuscular re-education;Manual techniques;Energy conservation;Balance training;Therapeutic exercise;Therapeutic activities;Functional mobility training;Stair training;Gait  training;Patient/family education    PT Next Visit Plan Re-assess-possibly hold treatment as he will be beginning pulmonary rehab next week.    PT Home Exercise Plan [id number]    Consulted and Agree with Plan of Care Patient             Patient will benefit from skilled therapeutic intervention in order to improve the following deficits and impairments:  Difficulty walking, Cardiopulmonary status limiting activity, Decreased endurance, Decreased activity tolerance, Pain, Postural dysfunction, Decreased strength, Decreased mobility, Decreased balance, Improper body mechanics  Visit Diagnosis: Difficulty in walking, not elsewhere classified  Muscle weakness (generalized)  Chronic pain of left knee  Chronic pain of right knee     Problem List Patient Active Problem List   Diagnosis Date Noted   PFO (patent foramen ovale) 12/28/2021   Asthma 12/26/2021   Celiac artery dilatation ([place]) 12/17/2021   CAP (community acquired pneumonia) 10/13/2021   Dyspnea    Gastrointestinal hemorrhage    Acute respiratory failure with hypoxia ([place]) 10/07/2021   ABLA (acute blood loss anemia) 10/07/2021   History of pulmonary embolus ([place]) 10/07/2021   AF (paroxysmal atrial fibrillation) ([place]) 10/07/2021   Hypertrophic cardiomyopathy ([place]) 08/15/2021   Ventricular tachycardia 08/02/2021   Ventricular tachycardia, unspecified 08/02/2021   Acute on chronic respiratory failure ([place]) 06/07/2021   History of COVID-19 06/07/2021   Rheumatoid arthritis involving multiple sites on Humira ([place]) 06/07/2021   On chronic prednisone therapy for Rheumatoid Arthritis 06/07/2021   CAD (coronary artery disease), non-obstructive 05/30/2021   Elevated troponin    Precordial chest pain    Heart failure with mid-range ejection fraction ([place]) 05/27/2021   LVH (left ventricular hypertrophy) due to hypertensive disease, with  heart failure ([place]) 05/27/2021   Class 2 severe obesity with serious comorbidity and body  mass index (BMI) of 35.0 to 35.9 in adult [facility]) 05/26/2021   Pulmonary embolism ([place]) 05/25/2021   Dyspnea on exertion    Thoracic aortic aneurysm without rupture 02/01/2021   Ascending aortic aneurysm 02/01/2021   Other hyperlipidemia 10/14/2020   Testicular swelling 05/14/2019   Pain in right knee 01/29/2019   PVC (premature ventricular contraction) 10/07/2018   Ventricular premature depolarization 10/07/2018   Dilated aortic root (HCC)    Allergic rhinitis 01/10/2016   Chronic gout without tophus 01/10/2016   Drug therapy 01/10/2016   OSA (obstructive sleep apnea) 01/10/2016   Colonic polyp 12/14/2015   Rt groin pain 12/14/2015   Scoliosis of thoracolumbar spine 12/14/2015   Right lower quadrant abdominal pain 04/20/2015   Arthritis of knee 02/22/2015   Edema of extremities 02/22/2015   Essential hypertension 02/22/2015   Preventative health care 02/22/2015   Bradycardia by electrocardiogram 04/16/2014    Marcelina Morel, DPT 02/07/2022, 5:18 PM  [address] [place] [place], [place], [address] Phone: [phone]   Fax:  [phone]  Name: Ryan Glass MRN: [id number] Date of Birth: 1956-09-25

## 2022-02-07 NOTE — Telephone Encounter (Signed)
Reviewed Epic rheumatology has noted worsening of knee osteoarthritis severity and noted to have bone spurs in feet also now.  Referred to podiatry.  F/u recommended in 4 months continue prednisone, orencia and T#3.  Pulmonary perfusion study yesterday results still pending. ?

## 2022-02-08 NOTE — Telephone Encounter (Signed)
Patient notified chest xray and lung perfusion scan normal.  Patient has walk test scheduled for tomorrow and next week echo with bubble test and follow up with Dr Laurene Footman at [facility].  Patient stated rheumatology gave him referral for podiatry and orthopedics for feet/knee pain.  Rheumatology recommended freezing therapy.  Discussed with patient I am not familiar with this therapy for knee arthritis. Discussed with patient rheumatology/xrays knees showing severe arthritis has he considered knee replacements/discussed with surgeon?  Patient stated not yet but planning to do so this year as pain worsening along with mobility.  Patient concerned he is taking orencia, prednisone, tylenol #3 and topical cream and still not getting much relief from pain.  Discussed with patient typically prior to knee replacement PT required to strengthen leg muscles.  Patient reported right foot great toe bothering him a great deal also and numb at times and noticed the joint is getting larger ?bunion.  Encouraged him to schedule appt with podiatry.  Patient planning to see providers that are in the same building with his PCM.  Wearing hiking boots today at work.  Patient asked if he should cancel 8 Mar cardiopulmonary exercise test appt at [facility].  I discussed with patient that I recommended he keep appt.  Patient concerned since knees bothering him and starting pulmonary rehab and has had to reschedule cardiac rehab appts due to knee pain.  Patient also asking if safe for him to travel to [place] in September for vacation due to previous blood clot after air travel.  Discussed with patient to wear compression socks, get up and walk during flight when allowed and perform heel/toe pumps when sitting hourly also along with taking his anticoagulation medicine as prescribed will help him to prevent future blood clots.  Avoiding dehydration also.   Patient stated he was going to send Dr Ladona Ridgel message regarding adjusting his pacemaker pulse  rate today--he forgot to do so last week.   Patient verbalized understanding information and had no further questions at this time. ?

## 2022-02-08 NOTE — Telephone Encounter (Signed)
CLINICAL DATA:  Chest pain. ?  ?EXAM: ?NUCLEAR MEDICINE PERFUSION LUNG SCAN ?  ?TECHNIQUE: ?Perfusion images were obtained in multiple projections after ?intravenous injection of radiopharmaceutical. ?  ?Ventilation scans intentionally deferred if perfusion scan and chest ?x-ray adequate for interpretation during COVID 19 epidemic. ?  ?RADIOPHARMACEUTICALS:  4.2 mCi Tc-63m MAA IV ?  ?COMPARISON:  None. ?  ?FINDINGS: ?Normal homogeneous distribution of tracer activity is seen ?throughout both lungs without focal, segmental or subsegmental ?perfusion defects. ?  ?IMPRESSION: ?Normal nuclear medicine perfusion lung scan. ?  ?  ?Electronically Signed ?  By: Aram Candela M.D. ?  On: 02/07/2022 20:45 ?  ?

## 2022-02-08 NOTE — Telephone Encounter (Signed)
CLINICAL DATA:  Shortness of breath ?  ?EXAM: ?CHEST - 2 VIEW ?  ?COMPARISON:  10/13/2021 ?  ?FINDINGS: ?Left AICD remains in place, unchanged. Heart is upper limits normal ?in size. Lungs clear. No effusions or acute bony abnormality. ?  ?IMPRESSION: ?No active cardiopulmonary disease. ?  ?  ?Electronically Signed ?  By: Charlett Nose M.D. ?  On: 02/07/2022 19:55 ?

## 2022-02-09 ENCOUNTER — Other Ambulatory Visit: Payer: Self-pay

## 2022-02-09 ENCOUNTER — Encounter (HOSPITAL_COMMUNITY)
Admission: RE | Admit: 2022-02-09 | Discharge: 2022-02-09 | Disposition: A | Discharge status: Home / Self Care | Payer: No Typology Code available for payment source | Source: Ambulatory Visit | Attending: Pulmonary Disease | Admitting: Pulmonary Disease

## 2022-02-09 ENCOUNTER — Encounter (HOSPITAL_COMMUNITY): Payer: Self-pay

## 2022-02-09 VITALS — BP 142/80 | HR 80 | Ht 70.0 in | Wt 242.9 lb

## 2022-02-09 DIAGNOSIS — J9611 Chronic respiratory failure with hypoxia: Secondary | ICD-10-CM | POA: Diagnosis not present

## 2022-02-09 NOTE — Progress Notes (Addendum)
Pulmonary Individual Treatment Plan  Patient Details  Name: DANIELL TINDER MRN: [id number] Date of Birth: 09/30/1956 Referring Provider:   April Manson Pulmonary Rehab Walk Test from 02/09/2022 in [place]  Referring Provider Dewald       Initial Encounter Date:  Flowsheet Row Pulmonary Rehab Walk Test from 02/09/2022 in [place]  Date 02/09/22       Visit Diagnosis: Chronic respiratory failure with hypoxia ([place])  Patient's Home Medications on Admission:   Current Outpatient Medications:    Abatacept (ORENCIA CLICKJECT) 0000000 MG/ML SOAJ, Inject into the skin once a week., Disp: , Rfl:    Acetaminophen-Codeine 300-30 MG tablet, Take 1-2 tablets by mouth every 12 (twelve) hours as needed for pain., Disp: , Rfl:    albuterol (VENTOLIN HFA) 108 (90 Base) MCG/ACT inhaler, Inhale 2 puffs into the lungs every 4 (four) hours as needed for wheezing or shortness of breath., Disp: 8 g, Rfl: 2   apixaban (ELIQUIS) 5 MG TABS tablet, Take 1 tablet by mouth 2 (two) times daily., Disp: , Rfl:    atorvastatin (LIPITOR) 40 MG tablet, Take 1 tablet (40 mg total) by mouth daily., Disp: 90 tablet, Rfl: 3   colchicine 0.6 MG tablet, Take 1-2 tablets by mouth daily., Disp: , Rfl:    fluticasone (FLONASE) 50 MCG/ACT nasal spray, Place 1 spray into both nostrils daily., Disp: , Rfl:    fluticasone-salmeterol (ADVAIR) 250-50 MCG/ACT AEPB, , Disp: , Rfl:    Fluticasone-Umeclidin-Vilant (TRELEGY ELLIPTA) 100-62.5-25 MCG/ACT AEPB, Inhale 1 puff into the lungs daily., Disp: 60 each, Rfl: 0   folic acid (FOLVITE) 1 MG tablet, Take 1 mg by mouth daily., Disp: , Rfl:    furosemide (LASIX) 40 MG tablet, Take 1 tablet (40 mg total) by mouth daily as needed (weight gain). Take 1 tablet by mouth daily for 3 days, then decrease to only as needed for increase weight gain, Disp: 30 tablet, Rfl: 3   gabapentin (NEURONTIN) 600 MG tablet, Take 600 mg by mouth with  breakfast, with lunch, and with evening meal., Disp: , Rfl:    hydrALAZINE (APRESOLINE) 100 MG tablet, Take 100 mg by mouth 3 (three) times daily., Disp: , Rfl:    loratadine (CLARITIN) 10 MG tablet, Take 10 mg by mouth daily., Disp: , Rfl:    Magnesium Oxide 400 MG CAPS, Take 1 capsule (400 mg total) by mouth every other day. While taking daily multivitamin with 100mg  magnesium every day, Disp: 90 capsule, Rfl: 3   montelukast (SINGULAIR) 10 MG tablet, Take 1 tablet (10 mg total) by mouth at bedtime., Disp: 90 tablet, Rfl: 3   nebivolol (BYSTOLIC) 5 MG tablet, Take 1 tablet (5 mg total) by mouth daily., Disp: 90 tablet, Rfl: 3   pantoprazole (PROTONIX) 40 MG tablet, Take 1 tablet (40 mg total) by mouth 2 (two) times daily., Disp: 60 tablet, Rfl: 1   potassium chloride SA (KLOR-CON M) 20 MEQ tablet, Take 1 tablet by  mouth only when you have to use a Lasix, Disp: 90 tablet, Rfl: 3   predniSONE (DELTASONE) 5 MG tablet, Take 5 mg by mouth daily with breakfast., Disp: , Rfl:    sildenafil (REVATIO) 20 MG tablet, Take 20 mg by mouth daily as needed (erectile dysfunction)., Disp: , Rfl:    sotalol (BETAPACE) 120 MG tablet, Take 1 tablet (120 mg total) by mouth every 12 (twelve) hours., Disp: 60 tablet, Rfl: 6   spironolactone (ALDACTONE) 25  MG tablet, Take 1 tablet (25 mg total) by mouth daily., Disp: 30 tablet, Rfl: 6  Past Medical History: Past Medical History:  Diagnosis Date   Ascending aortic aneurysm    a. CT 05/2021 showed 4.4x4.4cm TAA. Stable on chest CTA 12/2021 at 4.4cm.   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 There is severe asymmetric left ventricular hypertrophy. G1DD present.    History of COVID-19 06/07/2021   HOCM (hypertrophic obstructive cardiomyopathy) (HCC)    s/p ICD 07/2021   Hypertension    followed by cardiology, dr t. turner   (05-14-2018 nuclear study in epic , normal perfusion with nuclear ef 61%)   Left hydrocele    Low serum potassium level 04/27/2014   OSA on CPAP    per pt uses every night   Palpitations followed by dr t. Mayford Knife   06-08-2020  still feels palipations due to PVCs when exertion but not with chest pain/ discomfort   Pneumonia due to COVID-19 virus    PVC's (premature ventricular contractions) cardiologist--- dr t. turner   Status post PVC ablation by Dr. Ladona Ridgel 2019 with recurrence of frequent PVCs/bigeminy;  prior pseudobradycardia r/t pvcs   Rheumatoid arthritis involving multiple sites [facility])    rheumotology--- dr a. Sharmon Revere  ([facility] in [place])    Tobacco Use: Social History   Tobacco Use  Smoking Status Never  Smokeless Tobacco Never    Labs: Recent Review Flowsheet Data     Labs for ITP Cardiac and Pulmonary Rehab Latest Ref Rng & Units 05/29/2021 05/29/2021 10/07/2021 10/07/2021 10/07/2021   Cholestrol 0 - 200 mg/dL - - - - -   LDLCALC 0 - 99 mg/dL - - - - -   HDL >97 mg/dL - - - - -   Trlycerides <150 mg/dL - - - - -   Hemoglobin A1c 4.8 - 5.6 % - - - - -   PHART 7.350 - 7.450 - 7.430 - - -   PCO2ART 32.0 - 48.0 mmHg - 33.4 - - -   HCO3 20.0 - 28.0 mmol/L 18.3(L) 22.2 - 19.1(L) 19.5(L)   TCO2 22 - 32 mmol/L 19(L) 23 19(L) 20(L) -   ACIDBASEDEF 0.0 - 2.0 mmol/L 6.0(H) 2.0 - 4.0(H) 4.3(H)   O2SAT % 71.0 99.0 - 90.0 82.3       Capillary Blood Glucose: No results found for: GLUCAP   Pulmonary Assessment Scores:  Pulmonary Assessment Scores     Row Name 02/09/22 1109         ADL UCSD   ADL Phase Entry     SOB Score total 74       CAT Score   CAT Score 17        mMRC Score   mMRC Score 4             UCSD: Self-administered rating of dyspnea associated with activities of daily living (ADLs) 6-point scale (0 = "not at all" to 5 = "maximal or unable  to do because of breathlessness")  Scoring Scores range from 0 to 120.  Minimally important difference is 5 units  CAT: CAT can identify the health impairment of COPD patients and is better correlated with disease progression.  CAT has a scoring range of zero to 40. The CAT score is classified into four groups of low (less than 10), medium (10 - 20), high (21-30) and very high (31-40) based on the impact level of disease on health status. A CAT score over 10 suggests significant symptoms.  A worsening CAT score could be explained by an exacerbation, poor medication adherence, poor inhaler technique, or progression of COPD or comorbid conditions.  CAT MCID is 2 points  mMRC: mMRC (Modified Medical Research Council) Dyspnea Scale is used to assess the degree of baseline functional disability in patients of respiratory disease due to dyspnea. No minimal important difference is established. A decrease in score of 1 point or greater is considered a positive change.   Pulmonary Function Assessment:  Pulmonary Function Assessment - 02/09/22 1112       Breath   Bilateral Breath Sounds Clear    Shortness of Breath Yes;Limiting activity;Fear of Shortness of Breath             Exercise Target Goals: Exercise Program Goal: Individual exercise prescription set using results from initial 6 min walk test and THRR while considering  patients activity barriers and safety.   Exercise Prescription Goal: Initial exercise prescription builds to 30-45 minutes a day of aerobic activity, 2-3 days per week.  Home exercise guidelines will be given to patient during program as part of exercise prescription that the participant will acknowledge.  Activity Barriers & Risk Stratification:  Activity Barriers &  Cardiac Risk Stratification - 02/09/22 1044       Activity Barriers & Cardiac Risk Stratification   Activity Barriers Arthritis;Shortness of Breath;Muscular Weakness;Deconditioning;Back Problems    Cardiac Risk Stratification Moderate             6 Minute Walk:  6 Minute Walk     Row Name 02/09/22 1200         6 Minute Walk   Phase Initial     Distance 1285 feet     Walk Time 6 minutes     # of Rest Breaks 0     MPH 2.43     METS 2.95     RPE 13     Perceived Dyspnea  3     VO2 Peak 10.31     Symptoms No     Resting HR 80 bpm     Resting BP 142/80     Resting Oxygen Saturation  92 %     Exercise Oxygen Saturation  during 6 min walk 93 %     Max Ex. HR 108 bpm     Max Ex. BP 148/80     2 Minute Post BP 138/78       Interval HR   1 Minute HR 99     2 Minute HR 100     3 Minute HR 99     4 Minute HR 100     5 Minute HR 108     6 Minute HR 95     2 Minute Post HR 81     Interval Heart Rate? Yes       Interval Oxygen   Interval Oxygen? Yes     Baseline Oxygen Saturation % 92 %     1 Minute  Oxygen Saturation % 94 %     1 Minute Liters of Oxygen 0 L     2 Minute Oxygen Saturation % 93 %     2 Minute Liters of Oxygen 0 L     3 Minute Oxygen Saturation % 94 %     3 Minute Liters of Oxygen 0 L     4 Minute Oxygen Saturation % 94 %     4 Minute Liters of Oxygen 0 L     5 Minute Oxygen Saturation % 94 %     5 Minute Liters of Oxygen 0 L     6 Minute Oxygen Saturation % 94 %     6 Minute Liters of Oxygen 0 L     2 Minute Post Oxygen Saturation % 94 %     2 Minute Post Liters of Oxygen 0 L              Oxygen Initial Assessment:  Oxygen Initial Assessment - 02/09/22 1034       Home Oxygen   Home Oxygen Device None    Sleep Oxygen Prescription CPAP    Home Exercise Oxygen Prescription None    Home Resting Oxygen Prescription None    Compliance with Home Oxygen Use Yes      Initial 6 min Walk   Oxygen Used None      Program Oxygen Prescription    Program Oxygen Prescription None      Intervention   Short Term Goals To learn and exhibit compliance with exercise, home and travel O2 prescription;To learn and understand importance of monitoring SPO2 with pulse oximeter and demonstrate accurate use of the pulse oximeter.;To learn and understand importance of maintaining oxygen saturations>88%;To learn and demonstrate proper pursed lip breathing techniques or other breathing techniques. ;To learn and demonstrate proper use of respiratory medications    Long  Term Goals Exhibits compliance with exercise, home  and travel O2 prescription;Verbalizes importance of monitoring SPO2 with pulse oximeter and return demonstration;Maintenance of O2 saturations>88%;Exhibits proper breathing techniques, such as pursed lip breathing or other method taught during program session;Compliance with respiratory medication;Demonstrates proper use of MDIs             Oxygen Re-Evaluation:   Oxygen Discharge (Final Oxygen Re-Evaluation):   Initial Exercise Prescription:  Initial Exercise Prescription - 02/09/22 1200       Date of Initial Exercise RX and Referring Provider   Date 02/09/22    Referring Provider Dewald    Expected Discharge Date 04/12/22      Recumbant Bike   Level 1    Watts 25    Minutes 15      Track   Minutes 15      Prescription Details   Frequency (times per week) 2    Duration Progress to 30 minutes of continuous aerobic without signs/symptoms of physical distress      Intensity   THRR 40-80% of Max Heartrate 62-124    Ratings of Perceived Exertion 11-13    Perceived Dyspnea 0-4      Progression   Progression Continue to progress workloads to maintain intensity without signs/symptoms of physical distress.      Resistance Training   Training Prescription Yes    Weight blue bands    Reps 10-15             Perform Capillary Blood Glucose checks as needed.  Exercise Prescription Changes:   Exercise  Comments:   Exercise Goals and  Review:   Exercise Goals     Row Name 02/09/22 1047             Exercise Goals   Increase Physical Activity Yes       Intervention Provide advice, education, support and counseling about physical activity/exercise needs.;Develop an individualized exercise prescription for aerobic and resistive training based on initial evaluation findings, risk stratification, comorbidities and participant's personal goals.       Expected Outcomes Short Term: Attend rehab on a regular basis to increase amount of physical activity.;Long Term: Add in home exercise to make exercise part of routine and to increase amount of physical activity.;Long Term: Exercising regularly at least 3-5 days a week.       Increase Strength and Stamina Yes       Intervention Provide advice, education, support and counseling about physical activity/exercise needs.;Develop an individualized exercise prescription for aerobic and resistive training based on initial evaluation findings, risk stratification, comorbidities and participant's personal goals.       Expected Outcomes Short Term: Increase workloads from initial exercise prescription for resistance, speed, and METs.;Short Term: Perform resistance training exercises routinely during rehab and add in resistance training at home;Long Term: Improve cardiorespiratory fitness, muscular endurance and strength as measured by increased METs and functional capacity (6MWT)       Able to understand and use rate of perceived exertion (RPE) scale Yes       Intervention Provide education and explanation on how to use RPE scale       Expected Outcomes Short Term: Able to use RPE daily in rehab to express subjective intensity level;Long Term:  Able to use RPE to guide intensity level when exercising independently       Able to understand and use Dyspnea scale Yes       Intervention Provide education and explanation on how to use Dyspnea scale       Expected  Outcomes Short Term: Able to use Dyspnea scale daily in rehab to express subjective sense of shortness of breath during exertion;Long Term: Able to use Dyspnea scale to guide intensity level when exercising independently       Knowledge and understanding of Target Heart Rate Range (THRR) Yes       Intervention Provide education and explanation of THRR including how the numbers were predicted and where they are located for reference       Expected Outcomes Short Term: Able to state/look up THRR;Long Term: Able to use THRR to govern intensity when exercising independently;Short Term: Able to use daily as guideline for intensity in rehab       Understanding of Exercise Prescription Yes       Intervention Provide education, explanation, and written materials on patient's individual exercise prescription       Expected Outcomes Short Term: Able to explain program exercise prescription;Long Term: Able to explain home exercise prescription to exercise independently                Exercise Goals Re-Evaluation :   Discharge Exercise Prescription (Final Exercise Prescription Changes):   Nutrition:  Target Goals: Understanding of nutrition guidelines, daily intake of sodium 1500mg , cholesterol 200mg , calories 30% from fat and 7% or less from saturated fats, daily to have 5 or more servings of fruits and vegetables.  Biometrics:  Pre Biometrics - 02/09/22 1013       Pre Biometrics   Grip Strength 21 kg  Nutrition Therapy Plan and Nutrition Goals:   Nutrition Assessments:  MEDIFICTS Score Key: ?70 Need to make dietary changes  40-70 Heart Healthy Diet ? 40 Therapeutic Level Cholesterol Diet   Picture Your Plate Scores: [id number] Unhealthy dietary pattern with much room for improvement. 41-50 Dietary pattern unlikely to meet recommendations for good health and room for improvement. 51-60 More healthful dietary pattern, with some room for improvement.  >60 Healthy dietary  pattern, although there may be some specific behaviors that could be improved.    Nutrition Goals Re-Evaluation:   Nutrition Goals Discharge (Final Nutrition Goals Re-Evaluation):   Psychosocial: Target Goals: Acknowledge presence or absence of significant depression and/or stress, maximize coping skills, provide positive support system. Participant is able to verbalize types and ability to use techniques and skills needed for reducing stress and depression.  Initial Review & Psychosocial Screening:  Initial Psych Review & Screening - 02/09/22 1037       Initial Review   Current issues with None Identified      Family Dynamics   Good Support System? Yes    Comments wife and 3 daughters      Barriers   Psychosocial barriers to participate in program There are no identifiable barriers or psychosocial needs.      Screening Interventions   Interventions Encouraged to exercise             Quality of Life Scores:  Scores of 19 and below usually indicate a poorer quality of life in these areas.  A difference of  2-3 points is a clinically meaningful difference.  A difference of 2-3 points in the total score of the Quality of Life Index has been associated with significant improvement in overall quality of life, self-image, physical symptoms, and general health in studies assessing change in quality of life.  PHQ-9: Recent Review Flowsheet Data     Depression screen [facility] 2/9 02/09/2022   Decreased Interest 0   Down, Depressed, Hopeless 0   PHQ - 2 Score 0   Altered sleeping 0   Tired, decreased energy 0   Change in appetite 0   Feeling bad or failure about yourself  0   Trouble concentrating 0   Moving slowly or fidgety/restless 0   Suicidal thoughts 0   PHQ-9 Score 0   Difficult doing work/chores Not difficult at all      Interpretation of Total Score  Total Score Depression Severity:  1-4 = Minimal depression, 5-9 = Mild depression, 10-14 = Moderate depression,  15-19 = Moderately severe depression, 20-27 = Severe depression   Psychosocial Evaluation and Intervention:  Psychosocial Evaluation - 02/09/22 1038       Psychosocial Evaluation & Interventions   Interventions Encouraged to exercise with the program and follow exercise prescription    Expected Outcomes For Jovonni to participate in Pulmonary Rehab    Continue Psychosocial Services  Follow up required by staff             Psychosocial Re-Evaluation:   Psychosocial Discharge (Final Psychosocial Re-Evaluation):   Education: Education Goals: Education classes will be provided on a weekly basis, covering required topics. Participant will state understanding/return demonstration of topics presented.  Learning Barriers/Preferences:  Learning Barriers/Preferences - 02/09/22 1040       Learning Barriers/Preferences   Learning Barriers Language   First language is [place]   Learning Preferences Individual Instruction;Computer/Internet;Skilled Demonstration;Written Material;Video             Education Topics: Risk Factor Reduction:  -  Group instruction that is supported by a PowerPoint presentation. Instructor discusses the definition of a risk factor, different risk factors for pulmonary disease, and how the heart and lungs work together.     Nutrition for Pulmonary Patient:  -Group instruction provided by PowerPoint slides, verbal discussion, and written materials to support subject matter. The instructor gives an explanation and review of healthy diet recommendations, which includes a discussion on weight management, recommendations for fruit and vegetable consumption, as well as protein, fluid, caffeine, fiber, sodium, sugar, and alcohol. Tips for eating when patients are short of breath are discussed.   Pursed Lip Breathing:  -Group instruction that is supported by demonstration and informational handouts. Instructor discusses the benefits of pursed lip and diaphragmatic  breathing and detailed demonstration on how to preform both.     Oxygen Safety:  -Group instruction provided by PowerPoint, verbal discussion, and written material to support subject matter. There is an overview of What is Oxygen and Why do we need it.  Instructor also reviews how to create a safe environment for oxygen use, the importance of using oxygen as prescribed, and the risks of noncompliance. There is a brief discussion on traveling with oxygen and resources the patient may utilize.   Oxygen Equipment:  -Group instruction provided by [facility] Staff utilizing handouts, written materials, and equipment demonstrations.   Signs and Symptoms:  -Group instruction provided by written material and verbal discussion to support subject matter. Warning signs and symptoms of infection, stroke, and heart attack are reviewed and when to call the physician/911 reinforced. Tips for preventing the spread of infection discussed.   Advanced Directives:  -Group instruction provided by verbal instruction and written material to support subject matter. Instructor reviews Advanced Directive laws and proper instruction for filling out document.   Pulmonary Video:  -Group video education that reviews the importance of medication and oxygen compliance, exercise, good nutrition, pulmonary hygiene, and pursed lip and diaphragmatic breathing for the pulmonary patient.   Exercise for the Pulmonary Patient:  -Group instruction that is supported by a PowerPoint presentation. Instructor discusses benefits of exercise, core components of exercise, frequency, duration, and intensity of an exercise routine, importance of utilizing pulse oximetry during exercise, safety while exercising, and options of places to exercise outside of rehab.     Pulmonary Medications:  -Verbally interactive group education provided by instructor with focus on inhaled medications and proper administration.   Anatomy and  Physiology of the Respiratory System and Intimacy:  -Group instruction provided by PowerPoint, verbal discussion, and written material to support subject matter. Instructor reviews respiratory cycle and anatomical components of the respiratory system and their functions. Instructor also reviews differences in obstructive and restrictive respiratory diseases with examples of each. Intimacy, Sex, and Sexuality differences are reviewed with a discussion on how relationships can change when diagnosed with pulmonary disease. Common sexual concerns are reviewed.   MD DAY -A group question and answer session with a medical doctor that allows participants to ask questions that relate to their pulmonary disease state.   OTHER EDUCATION -Group or individual verbal, written, or video instructions that support the educational goals of the pulmonary rehab program.   Holiday Eating Survival Tips:  -Group instruction provided by PowerPoint slides, verbal discussion, and written materials to support subject matter. The instructor gives patients tips, tricks, and techniques to help them not only survive but enjoy the holidays despite the onslaught of food that accompanies the holidays.   Knowledge Questionnaire Score:  Knowledge Questionnaire Score -  02/09/22 1100       Knowledge Questionnaire Score   Pre Score 16/18             Core Components/Risk Factors/Patient Goals at Admission:  Personal Goals and Risk Factors at Admission - 02/09/22 1039       Core Components/Risk Factors/Patient Goals on Admission    Weight Management Weight Loss    Improve shortness of breath with ADL's Yes    Intervention Provide education, individualized exercise plan and daily activity instruction to help decrease symptoms of SOB with activities of daily living.    Expected Outcomes Short Term: Improve cardiorespiratory fitness to achieve a reduction of symptoms when performing ADLs;Long Term: Be able to perform more  ADLs without symptoms or delay the onset of symptoms             Core Components/Risk Factors/Patient Goals Review:    Core Components/Risk Factors/Patient Goals at Discharge (Final Review):    ITP Comments:  Dr. Rodman Pickle is Medical Director for Pulmonary Rehab at [facility].

## 2022-02-09 NOTE — Progress Notes (Signed)
Ryan Glass 66 y.o. male ?Pulmonary Rehab Orientation Note ?This patient who was referred to Pulmonary Rehab by Dr. Francine Graven with the diagnosis of Chronic respiratory failure with hypoxia arrived today in Cardiac and Pulmonary Rehab. He arrived with ambulatory normal gait. He does not carry portable oxygen. He has had oxygen in the past, but there were complications with his oxygen company. Per pt, Maxtyn uses oxygen never. Color good, skin warm and dry. Patient is oriented to time and place. Patient's medical history, psychosocial health, and medications reviewed. Psychosocial assessment reveals pt lives with spouse. 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 Pt hobbies include spending time with family, watching movies, and traveling to the beach.Pt reports his stress level is high. Areas of stress/anxiety include work. Ifeoluwa is hoping to retire soon so that him and his wife can travel. He is from [place] and would love to visit his home country for 2-3 months. Pt does not exhibit signs of depression. PHQ2/9 score 0/0. Tarif shows fair  coping skills with positive outlook on life. Offered emotional support and reassurance. Will continue to monitor. Physical assessment performed by Karlene Lineman, RN. Please see their orientation physical assessment note. Penn reports he  does take medications as prescribed. Patient states he  follows a regular  diet. The patient has been trying to lose weight through a healthy diet and exercise program.. Pt's weight will be monitored closely. Demonstration and practice of PLB using pulse oximeter. Renel able to return demonstration satisfactorily. Safety and hand hygiene in the exercise area reviewed with patient. Jasher voices understanding of the information reviewed. Department expectations discussed with patient and achievable goals were set. The patient shows enthusiasm about attending the program and we look forward to working with [facility] Xavion completed a 6 min walk test today and is  scheduled to begin exercise on 02/13/22 at 1:15pm.  ? ?1000-1140 ?Joya San, MS, ACSM-CEP ?  ?

## 2022-02-12 NOTE — Progress Notes (Signed)
Pulmonary Rehab Orientation Physical Assessment Note ? ?Physical assessment reveals  Pt is alert and oriented x 3.  Heart rate is normal, breath sounds clear to auscultation, no wheezes, rales, or rhonchi. Reports non-productive cough. Bowel sounds present.  Pt denies abdominal discomfort, nausea, vomiting or diarrhea. Grip strength equal, strong. Distal pulses palpable; trace swelling to lower extremities. Alanson Aly, BSN ?Cardiac and Pulmonary Rehab Nurse Navigator  ? ?

## 2022-02-13 ENCOUNTER — Encounter (HOSPITAL_COMMUNITY): Payer: No Typology Code available for payment source

## 2022-02-13 ENCOUNTER — Other Ambulatory Visit: Payer: Self-pay

## 2022-02-13 ENCOUNTER — Encounter (HOSPITAL_COMMUNITY)
Admission: RE | Admit: 2022-02-13 | Discharge: 2022-02-13 | Disposition: A | Discharge status: Home / Self Care | Payer: No Typology Code available for payment source | Source: Ambulatory Visit | Attending: Pulmonary Disease | Admitting: Pulmonary Disease

## 2022-02-13 VITALS — Wt 241.8 lb

## 2022-02-13 DIAGNOSIS — J9611 Chronic respiratory failure with hypoxia: Secondary | ICD-10-CM

## 2022-02-13 NOTE — Progress Notes (Signed)
Daily Session Note ? ?Patient Details  ?Name: ANTAEUS KAREL ?MRN: [id number] ?Date of Birth: August 10, 1956 ?Referring Provider:   ?Flowsheet Row Pulmonary Rehab Walk Test from 02/09/2022 in [place]  ?Referring Provider Dewald  ? ?  ? ? ?Encounter Date: 02/13/2022 ? ?Check In: ? Session Check In - 02/13/22 1336   ? ?  ? Check-In  ? Supervising physician immediately available to respond to emergencies [facility] immediately available   ? Physician(s) Dr. Ula Lingo   ? Location [facility]   ? Staff Present Maurice Small, RN, BSN;Lisa Ysidro Evert, RN;Macio Kissoon Shadybrook, RN, Quentin Ore, MS, ACSM-CEP, Exercise Physiologist;Annedrea Rosezella Florida, RN, MHA   ? Virtual Visit No   ? Medication changes reported     No   ? Fall or balance concerns reported    No   ? Tobacco Cessation No Change   ? Warm-up and Cool-down Performed as group-led instruction   ? Resistance Training Performed Yes   ? VAD Patient? No   ? PAD/SET Patient? No   ?  ? Pain Assessment  ? Currently in Pain? No/denies   ? Multiple Pain Sites No   ? ?  ?  ? ?  ? ? ?Capillary Blood Glucose: ?No results found for this or any previous visit (from the past 24 hour(s)). ? ? Exercise Prescription Changes - 02/13/22 1502   ? ?  ? Response to Exercise  ? Blood Pressure (Admit) 130/80   ? Blood Pressure (Exercise) 158/84   ? Blood Pressure (Exit) 128/84   ? Heart Rate (Admit) 82 bpm   ? Heart Rate (Exercise) 92 bpm   ? Heart Rate (Exit) 83 bpm   ? Oxygen Saturation (Admit) 94 %   ? Oxygen Saturation (Exercise) 94 %   ? Oxygen Saturation (Exit) 93 %   ? Rating of Perceived Exertion (Exercise) 11   ? Perceived Dyspnea (Exercise) 2   ? Duration Continue with 30 min of aerobic exercise without signs/symptoms of physical distress.   ? Intensity THRR unchanged   ?  ? Resistance Training  ? Training Prescription Yes   ? Weight blue bands   ? Reps 10-15   ?  ? NuStep  ? Level 1   ? SPM 70   ? Minutes 15   ?  ? Track  ? Minutes 15    ? ?  ?  ? ?  ? ? ?Social History  ? ?Tobacco Use  ?Smoking Status Never  ?Smokeless Tobacco Never  ? ? ?Goals Met:  ?Proper associated with RPD/PD & O2 Sat ?Exercise tolerated well ?No report of concerns or symptoms today ?Strength training completed today ? ?Goals Unmet:  ?Not Applicable ? ?Comments: Service time is from 1323 to 1440 ? ? ? ?Dr. Rodman Pickle is Medical Director for Pulmonary Rehab at [facility].  ?

## 2022-02-14 ENCOUNTER — Ambulatory Visit (HOSPITAL_COMMUNITY): Payer: No Typology Code available for payment source | Attending: Cardiology

## 2022-02-14 DIAGNOSIS — I5032 Chronic diastolic (congestive) heart failure: Secondary | ICD-10-CM | POA: Insufficient documentation

## 2022-02-14 DIAGNOSIS — R0600 Dyspnea, unspecified: Secondary | ICD-10-CM | POA: Insufficient documentation

## 2022-02-14 DIAGNOSIS — I2510 Atherosclerotic heart disease of native coronary artery without angina pectoris: Secondary | ICD-10-CM

## 2022-02-14 DIAGNOSIS — I110 Hypertensive heart disease with heart failure: Secondary | ICD-10-CM | POA: Diagnosis not present

## 2022-02-14 DIAGNOSIS — Q2112 Patent foramen ovale: Secondary | ICD-10-CM | POA: Diagnosis not present

## 2022-02-14 DIAGNOSIS — I472 Ventricular tachycardia, unspecified: Secondary | ICD-10-CM | POA: Diagnosis not present

## 2022-02-14 DIAGNOSIS — I7121 Aneurysm of the ascending aorta, without rupture: Secondary | ICD-10-CM | POA: Diagnosis not present

## 2022-02-14 DIAGNOSIS — I2782 Chronic pulmonary embolism: Secondary | ICD-10-CM | POA: Insufficient documentation

## 2022-02-14 DIAGNOSIS — I10 Essential (primary) hypertension: Secondary | ICD-10-CM

## 2022-02-14 DIAGNOSIS — I421 Obstructive hypertrophic cardiomyopathy: Secondary | ICD-10-CM

## 2022-02-14 DIAGNOSIS — R0609 Other forms of dyspnea: Secondary | ICD-10-CM | POA: Diagnosis not present

## 2022-02-14 NOTE — Progress Notes (Signed)
Remote ICD transmission.   

## 2022-02-15 ENCOUNTER — Encounter (HOSPITAL_COMMUNITY): Payer: No Typology Code available for payment source

## 2022-02-15 ENCOUNTER — Telehealth: Payer: Self-pay | Admitting: Registered Nurse

## 2022-02-15 DIAGNOSIS — E7849 Other hyperlipidemia: Secondary | ICD-10-CM

## 2022-02-15 MED ORDER — ATORVASTATIN CALCIUM 40 MG PO TABS: 40.0000 mg | ORAL_TABLET | Freq: Every day | ORAL | 0 refills | Status: DC

## 2022-02-15 NOTE — Telephone Encounter (Signed)
Patient last filled atorvastatin 16m po daily #90 from [facility] 11/16/21 per paperchart review.  [facility] and instructed to continue at current dose but new rx not given expired 01/09/2022.  Bridge refill dispensed to patient today and instructed to obtain new Rx with next cardiology appt/send Dr TRadford Paxmy chart message to renew.   Last Labs June/Oct 2022.   ? Latest Reference Range & Units Most Recent  ?Chloride 96 - 106 mmol/L 108 (H) ?12/18/21 12:44  ?CO2 20 - 29 mmol/L 16 (L) ?12/18/21 12:44  ?Glucose 70 - 99 mg/dL 95 ?12/18/21 12:44  ?BUN 8 - 27 mg/dL 33 (H) ?12/18/21 12:44  ?Creatinine 0.76 - 1.27 mg/dL 1.40 (H) ?12/18/21 12:44  ?Calcium 8.6 - 10.2 mg/dL 9.7 ?12/18/21 12:44  ?Anion gap 5 - 15  7 ?10/12/21 02:57  ?BUN/Creatinine Ratio 10 - 24  24 ?12/18/21 12:44  ?eGFR >59 mL/min/1.73 56 (L) ?12/18/21 12:44  ?Calcium Ionized 1.15 - 1.40 mmol/L ?1.15 - 1.40 mmol/L 1.09 (L) ?10/07/21 12:53 ?1.07 (L) ?10/07/21 12:53  ?Phosphorus 2.8 - 4.1 mg/dL 3.4 ?02/20/21 12:15  ?Magnesium 1.6 - 2.3 mg/dL 2.4 (H) ?12/18/21 12:44  ?Alkaline Phosphatase 38 - 126 U/L 51 ?10/07/21 10:31  ?Albumin 3.5 - 5.0 g/dL 3.6 ?10/07/21 10:31  ?Albumin/Globulin Ratio 1.2 - 2.2  2.3 (H) ?02/20/21 12:15  ?Uric Acid 3.8 - 8.4 mg/dL 8.6 (H) ?02/20/21 12:15  ?Lipase 11 - 51 U/L 41 ?06/06/21 17:03  ?AST 15 - 41 U/L 28 ?10/07/21 10:31  ?ALT 0 - 44 U/L 26 ?10/07/21 10:31  ?Total Protein 6.5 - 8.1 g/dL 5.9 (L) ?10/07/21 10:31  ?Bilirubin, Direct 0.0 - 0.2 mg/dL 0.2 ?08/02/21 21:16  ?Indirect Bilirubin 0.3 - 0.9 mg/dL 0.5 ?08/02/21 21:16  ?Total Bilirubin 0.3 - 1.2 mg/dL 1.1 ?10/07/21 10:31  ?GGT 0 - 65 IU/L 27 ?02/20/21 12:15  ?GFR, Estimated >60 mL/min 60 (L) ?10/12/21 02:57  ?GFR, Est Non African American >59 mL/min/1.73 57 (L) ?02/03/21 15:15  ?GFR, Est African American >59 mL/min/1.73 65 ?02/03/21 15:15  ?B Natriuretic Peptide 0.0 - 100.0 pg/mL 33.9 ?10/07/21 10:31  ?Estimated CHD Risk 0.0 - 1.0 times avg.  <  0.5 ?02/20/21 12:15  ?LDH 121 - 224 IU/L 272 (H) ?02/20/21 12:15  ?NT-Pro BNP 0 - 376 pg/mL 183 ?12/05/21 16:37  ?Troponin I (High Sensitivity) <18 ng/L 12 ?10/07/21 12:31  ?Total CHOL/HDL Ratio RATIO 2.5 ?05/27/21 01:38  ?Cholesterol 0 - 200 mg/dL 123 ?05/27/21 01:38  ?Cholesterol, Total 100 - 199 mg/dL 136 ?02/20/21 12:15  ?HDL Cholesterol >40 mg/dL 49 ?05/27/21 01:38  ?LDL (calc) 0 - 99 mg/dL 61 ?05/27/21 01:38  ?Triglycerides <150 mg/dL 67 ?05/27/21 01:38  ?VLDL 0 - 40 mg/dL 13 ?05/27/21 01:38  ?VLDL Cholesterol Cal 5 - 40 mg/dL 12 ?02/20/21 12:15  ?LDL Chol Calc (NIH) 0 - 99 mg/dL 60 ?02/20/21 12:15  ?(H): Data is abnormally high ?(L): Data is abnormally low ? ? Patient verbalized understanding information/instructions, agreed with plan of care and had no further questions at this time. ?

## 2022-02-17 NOTE — Telephone Encounter (Signed)
Reviewed Epic Dr Corlis Leak cardiology [facility] note 02/15/22 "ASSESSMENT AND PLAN: ?1. Hypertrophic cardiomyopathy. Patient has evidence for hypertrophic cardiomyopathy by cardiac MRI performed in the [facility] health [facility]. He did not have significant outflow tract gradient on his echocardiograms. Without significant gradient, I do not think hypertrophic cardiomyopathy explains his shortness of breath. With hypertrophic cardiomyopathy, first-degree relatives should be screened and I have discussed this with the patient. I have also discussed with the patient and his daughter the option of seeing Dr. Derinda Sis. ? ?2. PFO. Patient had evidence for PFO by echocardiogram. He has been evaluated for platypnea orthodeoxia and I think this is probably a less likely diagnosis. He does have a small increase in his shunt upon standing but not enough that I think this likely accounts for his symptoms. ? ?3. Shortness of breath. Patient has significant shortness of breath which may be related mostly to lung disease. He did have abnormal CT scan last year in the [facility] system. Patient reports his pulmonologist thinks that he has a cardiac issue while the cardiologist have advised him that his shortness of breath is related to his lungs. I have encouraged him to exercise 30 minutes daily and see how he progresses with that. If he does not have improvement, could consider right heart catheterization to assess for pulmonary hypertension or CPX testing. ? ?4. VT and symptomatic PVCs. Patient has a prior history of VT and has an ICD in place. He is currently receiving sotalol.  ? ?I will see the patient back in this clinic in 3 months."  Will follow up with patient Tuesday 3/14 to see if questions regarding instructions. ?

## 2022-02-20 ENCOUNTER — Encounter (HOSPITAL_COMMUNITY)
Admission: RE | Admit: 2022-02-20 | Discharge: 2022-02-20 | Disposition: A | Discharge status: Home / Self Care | Payer: No Typology Code available for payment source | Source: Ambulatory Visit | Attending: Pulmonary Disease | Admitting: Pulmonary Disease

## 2022-02-20 ENCOUNTER — Telehealth: Payer: Self-pay

## 2022-02-20 ENCOUNTER — Other Ambulatory Visit: Payer: Self-pay

## 2022-02-20 ENCOUNTER — Encounter (HOSPITAL_COMMUNITY): Payer: No Typology Code available for payment source

## 2022-02-20 DIAGNOSIS — Q2112 Patent foramen ovale: Secondary | ICD-10-CM

## 2022-02-20 DIAGNOSIS — J9611 Chronic respiratory failure with hypoxia: Secondary | ICD-10-CM | POA: Diagnosis not present

## 2022-02-20 DIAGNOSIS — I421 Obstructive hypertrophic cardiomyopathy: Secondary | ICD-10-CM

## 2022-02-20 NOTE — Progress Notes (Signed)
Daily Session Note ? ?Patient Details  ?Name: Ryan Glass ?MRN: [id number] ?Date of Birth: [birth year] ?Referring Provider:   ?Flowsheet Row Pulmonary Rehab Walk Test from 02/09/2022 in [place]  ?Referring Provider Dewald  ? ?  ? ? ?Encounter Date: 02/20/2022 ? ?Check In: ? Session Check In - 02/20/22 1330   ? ?  ? Check-In  ? Supervising physician immediately available to respond to emergencies [facility] MD immediately available   ? Physician(s) Dr Alfredia Ferguson   ? Location [facility]   ? Staff Present Rodney Langton, RN;Other;Kaylee Rosana Hoes, MS, ACSM-CEP, Exercise Physiologist   ? Virtual Visit No   ? Medication changes reported     No   ? Fall or balance concerns reported    No   ? Tobacco Cessation No Change   ? Warm-up and Cool-down Performed as group-led instruction   ? Resistance Training Performed Yes   ? VAD Patient? No   ? PAD/SET Patient? No   ?  ? Pain Assessment  ? Currently in Pain? No/denies   ? Pain Score 0-No pain   ? ?  ?  ? ?  ? ? ?Capillary Blood Glucose: ?No results found for this or any previous visit (from the past 24 hour(s)). ? ? ? ?Social History  ? ?Tobacco Use  ?Smoking Status Never  ?Smokeless Tobacco Never  ? ? ?Goals Met:  ?Exercise tolerated well ?No report of concerns or symptoms today ?Strength training completed today ? ?Goals Unmet:  ?Not Applicable ? ?Comments: Service time is from 1326 to 1438 ? ? ? ?Dr. Rodman Pickle is Medical Director for Pulmonary Rehab at [facility].  ?

## 2022-02-20 NOTE — Telephone Encounter (Signed)
Per [name], patient needs appointment ASAP with Dr. Pilar Jarvisaylor/EP App for RR increase secondary to shortness of breath. Most recent remote 3/1. Corvue below threshold. Will discuss with Dr. Ladona Ridgelaylor and see if this is something that can be achieved with a device clinic appointment/industry assist if advisable.  ? ? ? ? ? ? ?

## 2022-02-20 NOTE — Telephone Encounter (Signed)
-----   Message from Elyse Hsu sent at 02/20/2022 11:43 AM EDT ----- ? ?----- Message ----- ?From: Theresia Majors, RN ?Sent: 02/19/2022   7:40 AM EDT ?To: Elyse Hsu ? ?Good Morning! ?Could you help me get this patient scheduled with EP? He sees Dr. Ladona Ridgel, but appointment can be with an extender.  ?Thank you!! ?----- Message ----- ?From: Quintella Reichert, MD ?Sent: 02/18/2022   6:29 PM EDT ?To: Theresia Majors, RN ? 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 ? ? ?

## 2022-02-20 NOTE — Telephone Encounter (Signed)
Spoke with patient in workcenter.  Reviewed test results and Dr Corlis Leak notes with patient via his my chart log in and my Haiku app.   His daughter was notified that recommended she have testing for cardiomyopathy.  Patient reported he thinks his mother had enlarged heart also.   He still hasn't decided but planning to keep [facility] Cardiology team since they would visit him in hospital and closer to his home.  Daughter is still pressing for him to switch to [facility] cardiology.  Patient working on exercise 30 minutes per day/continuing pulmonary rehab.  Encouraged patient to finish entire course of rehab.  Patient asked if I had reviewed his test results.  Discussed I could not see his latest results still.  He stated they became available last night/logged into my chart and showed to me.  FEV/FVC1 in expected range.  Spo2 stayed in 90s.  Some PVCs but no sustained arrhthymias.  Stated stress test was ceased due to dyspnea.   Patient trying to decrease portion sizes for meals to help with weight loss and has cut out 2 of his 3 daily coke consumption.  Adding 2 teaspoon sugar to coffee in the am going to cut down on that also.  Discussed with patient coffee healthier choice than coke if I had to give him a recommendation.  Discussed read research article on arthritis this weekend and extra added salt/sugar in diet has been linked with worsening disease.  Patient spoke with coworker and planning to schedule appt with his orthopedic provider/[facility] for consultation regarding knees.  Rheumatology ended up sending in referral for pain management for patient and patient doesn't want to just take more pain medication he would like to treat problem.  Discussed pain management could do nerve block, treat symptoms with medication but not treat arthritis.  Rheumatology and orthopedics would be treating arthritis.  Patient to send my chart message to Dr [name] regarding pacemaker rate adjustment still he has not done yet.  He  was confused if [facility] provider had adjusted pacemaker already.  Discussed with patient that from chart it stated since he was consulting and not treating provider he had not adjusted baseline rate.  Patient A&Ox3 spoke full sentences without difficulty agreed with plan of care and had no further questions at this time. Spoke full sentences without difficulty scant pedal edema no audible cough/congestion/wheezing/throat clearing during 15 minute conversation.  Skin warm dry and pink.  Patient agreed with plan of care and had no further questions at this time. ?

## 2022-02-22 ENCOUNTER — Encounter (HOSPITAL_COMMUNITY): Payer: No Typology Code available for payment source

## 2022-02-22 ENCOUNTER — Other Ambulatory Visit: Payer: Self-pay

## 2022-02-22 ENCOUNTER — Encounter (HOSPITAL_COMMUNITY)
Admission: RE | Admit: 2022-02-22 | Discharge: 2022-02-22 | Disposition: A | Discharge status: Home / Self Care | Payer: No Typology Code available for payment source | Source: Ambulatory Visit | Attending: Pulmonary Disease | Admitting: Pulmonary Disease

## 2022-02-22 DIAGNOSIS — J9611 Chronic respiratory failure with hypoxia: Secondary | ICD-10-CM | POA: Diagnosis not present

## 2022-02-22 NOTE — Progress Notes (Signed)
Daily Session Note ? ?Patient Details  ?Name: Ryan Glass ?MRN: [id number] ?Date of Birth: Sep 28, 1956 ?Referring Provider:   ?Flowsheet Row Pulmonary Rehab Walk Test from 02/09/2022 in [place]  ?Referring Provider Dewald  ? ?  ? ? ?Encounter Date: 02/22/2022 ? ?Check In: ? Session Check In - 02/22/22 1328   ? ?  ? Pain Assessment  ? Currently in Pain? Yes   ? Pain Score 4    ? Pain Location Knee   ? Pain Orientation Right;Left   ? ?  ?  ? ?  ? ? ?Capillary Blood Glucose: ?No results found for this or any previous visit (from the past 24 hour(s)). ? ? ? ?Social History  ? ?Tobacco Use  ?Smoking Status Never  ?Smokeless Tobacco Never  ? ? ?Goals Met:  ?Proper associated with RPD/PD & O2 Sat ?Exercise tolerated well ?No report of concerns or symptoms today ?Strength training completed today ? ?Goals Unmet:  ?Not Applicable ? ?Comments: Service time is from 1315 to 1430 ? ? ? ?Dr. Rodman Pickle is Medical Director for Pulmonary Rehab at [facility].  ?

## 2022-02-23 ENCOUNTER — Telehealth: Payer: Self-pay | Admitting: Cardiology

## 2022-02-23 NOTE — Addendum Note (Signed)
Addended by: Theresia MajorsVERBEY, Maurina Fawaz on: 02/23/2022 08:15 AM ? ? Modules accepted: Orders ? ?

## 2022-02-23 NOTE — Telephone Encounter (Signed)
Ryan Glass, patient's case manager with his insurance, states the patient requested she call. She says he was told he would be referred to another doctor, but is not sure why. She would like a call back to her at: [phone], option 1 ex: [phone] ? ?

## 2022-02-23 NOTE — Telephone Encounter (Signed)
Spoke with the patient and advised on need to see EP and CHF.  ?Will send message to schedulers to get him set up.  ?

## 2022-02-23 NOTE — Telephone Encounter (Signed)
Spoke with the patient's case manager and advised on reason for seeing EP back and referral to CHF clinic.  ?

## 2022-02-27 ENCOUNTER — Encounter (HOSPITAL_COMMUNITY): Payer: No Typology Code available for payment source

## 2022-02-27 ENCOUNTER — Other Ambulatory Visit: Payer: Self-pay

## 2022-02-27 ENCOUNTER — Encounter (HOSPITAL_COMMUNITY)
Admission: RE | Admit: 2022-02-27 | Discharge: 2022-02-27 | Disposition: A | Discharge status: Home / Self Care | Payer: No Typology Code available for payment source | Source: Ambulatory Visit | Attending: Pulmonary Disease | Admitting: Pulmonary Disease

## 2022-02-27 DIAGNOSIS — J9611 Chronic respiratory failure with hypoxia: Secondary | ICD-10-CM

## 2022-03-01 ENCOUNTER — Encounter (HOSPITAL_COMMUNITY)
Admission: RE | Admit: 2022-03-01 | Discharge: 2022-03-01 | Disposition: A | Discharge status: Home / Self Care | Payer: No Typology Code available for payment source | Source: Ambulatory Visit | Attending: Pulmonary Disease | Admitting: Pulmonary Disease

## 2022-03-01 ENCOUNTER — Other Ambulatory Visit: Payer: Self-pay

## 2022-03-01 ENCOUNTER — Encounter (HOSPITAL_COMMUNITY): Payer: No Typology Code available for payment source

## 2022-03-01 DIAGNOSIS — J9611 Chronic respiratory failure with hypoxia: Secondary | ICD-10-CM

## 2022-03-01 NOTE — Progress Notes (Signed)
Daily Session Note ? ?Patient Details  ?Name: Ryan Glass ?MRN: [id number] ?Date of Birth: 05/12/1956 ?Referring Provider:   ?Flowsheet Row Pulmonary Rehab Walk Test from 02/09/2022 in [place]  ?Referring Provider Dewald  ? ?  ? ? ?Encounter Date: 03/01/2022 ? ?Check In: ? Session Check In - 03/01/22 1414   ? ?  ? Check-In  ? Supervising physician immediately available to respond to emergencies [facility] immediately available   ? Physician(s) Dr. Pietro Cassis   ? Location [facility]   ? Staff Present Rosebud Poles, RN, Quentin Ore, MS, ACSM-CEP, Exercise Physiologist;Jetta Gilford Rile BS, ACSM EP-C, Exercise Physiologist;Carlette Wilber Oliphant, RN, BSN   ? Virtual Visit No   ? Medication changes reported     No   ? Fall or balance concerns reported    No   ? Tobacco Cessation No Change   ? Warm-up and Cool-down Performed as group-led instruction   ? Resistance Training Performed Yes   ? VAD Patient? No   ? PAD/SET Patient? No   ?  ? Pain Assessment  ? Currently in Pain? No/denies   ? Multiple Pain Sites No   ? ?  ?  ? ?  ? ? ?Capillary Blood Glucose: ?No results found for this or any previous visit (from the past 24 hour(s)). ? ? ? ?Social History  ? ?Tobacco Use  ?Smoking Status Never  ?Smokeless Tobacco Never  ? ? ?Goals Met:  ?Proper associated with RPD/PD & O2 Sat ?Exercise tolerated well ?No report of concerns or symptoms today ?Strength training completed today ? ?Goals Unmet:  ?Not Applicable ? ?Comments: Service time is from 1320 to 1440.  ? ? ?Dr. Rodman Pickle is Medical Director for Pulmonary Rehab at [facility].  ?

## 2022-03-01 NOTE — Telephone Encounter (Signed)
Patient reported therapy has been going well.  Leaving for next session soon.  Has scheduled appt with orthopedics for knee evaluation.  His therapist also recommended another provider for consideration if total knee required.  Discussed with patient to check and ensure new provider network with his insurance.  RN Rolly Salter can assist if he needs help checking.  Gait sure and steady walking across warehouse.  Spoke full sentences without difficulty while walking.  Skin warm dry and pink.  Patient verbalized understanding information/instructions and had no further questions at this time. ?

## 2022-03-06 ENCOUNTER — Encounter (HOSPITAL_COMMUNITY)
Admission: RE | Admit: 2022-03-06 | Discharge: 2022-03-06 | Disposition: A | Discharge status: Home / Self Care | Payer: No Typology Code available for payment source | Source: Ambulatory Visit | Attending: Pulmonary Disease | Admitting: Pulmonary Disease

## 2022-03-06 ENCOUNTER — Ambulatory Visit: Payer: Self-pay | Admitting: Registered Nurse

## 2022-03-06 ENCOUNTER — Other Ambulatory Visit: Payer: Self-pay

## 2022-03-06 ENCOUNTER — Encounter: Payer: Self-pay | Admitting: Registered Nurse

## 2022-03-06 ENCOUNTER — Encounter (HOSPITAL_COMMUNITY): Payer: No Typology Code available for payment source

## 2022-03-06 VITALS — BP 155/100 | HR 80 | Temp 98.4°F | Resp 16 | Ht 70.0 in | Wt 244.0 lb

## 2022-03-06 DIAGNOSIS — J069 Acute upper respiratory infection, unspecified: Secondary | ICD-10-CM

## 2022-03-06 DIAGNOSIS — I10 Essential (primary) hypertension: Secondary | ICD-10-CM

## 2022-03-06 DIAGNOSIS — J9611 Chronic respiratory failure with hypoxia: Secondary | ICD-10-CM

## 2022-03-06 MED ORDER — PREDNISONE 10 MG PO TABS: ORAL_TABLET | ORAL | 0 refills | Status: AC

## 2022-03-06 NOTE — Progress Notes (Signed)
Incomplete Session Note ? ?Patient Details  ?Name: Ryan Glass  ?MRN: [id number] ?Date of Birth: 09/08/1956 ?Referring Provider:   ?Flow[facility]  ?Referring Provider Dewald  ? ?  ? ? ?Ryan Glass  did not complete his rehab session.   came in today for PR exercise. He had been to his PCP today because he was not feeling well. He states that he feels like he is getting a cold. His PCP  gave him a diagnosis of a viral URI. He did have a Covid test today and it was negative. Checked pt in and he said he felt bad.BP 176/100 HR 83 RA saturation 94% and temperature orally was 97.8.Instructed pt that I felt he should not exercise today and go home and rest. He agreed to this.He also was given another Covid test to take on Thursday. I instructed pt to call on Thursday morning to let me know how he is feeling and to inform me of his Covid test results. He voices understanding. Pt discharged in stable condition. ?

## 2022-03-06 NOTE — Patient Instructions (Addendum)
Repeat covid test in 48 hours ? ?How to Perform a Sinus Rinse ?A sinus rinse is a home treatment that is used to rinse your sinuses with a germ-free (sterile) mixture of salt and water (saline solution). Sinuses are air-filled spaces in your skull that are behind the bones of your face and forehead. They open into your nasal cavity. ?A sinus rinse can help to clear mucus, dirt, dust, or pollen from your nasal cavity. You may do a sinus rinse when you have a cold, a virus, nasal allergy symptoms, a sinus infection, or stuffiness in your nose or sinuses. ?What are the risks? ?A sinus rinse is generally safe and effective. However, there are a few risks, which include: ?A burning sensation in your sinuses. This may happen if you do not make the saline solution as directed. Be sure to follow all directions when making the saline solution. ?Nasal irritation. ?Infection. This may be from unclean supplies or from contaminated water. Infection from contaminated water is rare, but possible. ?Do not do a sinus rinse if you have had ear or nasal surgery, ear infection, or plugged ears, unless recommended by your health care provider. ?Supplies needed: ?Saline solution or powder. ?Distilled or sterile water to mix with saline powder. ?You may use boiled and cooled tap water. Boil tap water for 5 minutes; cool until it is lukewarm. Use within 24 hours. ?Do not use regular tap water to mix with the saline solution. ?Neti pot or nasal rinse bottle. These supplies release the saline solution into your nose and through your sinuses. Neti pots and nasal rinse bottles can be purchased at [employer], a health food store, or online. ?How to perform a sinus rinse ? ?Wash your hands with soap and water for at least 20 seconds. If soap and water are not available, use hand sanitizer. ?Wash your device according to the directions that came with the product and then dry it. ?Use the solution that comes with your product or one that  is sold separately in stores. Follow the mixing directions on the package to mix with sterile or distilled water. ?Fill the device with the amount of saline solution noted in the device instructions. ?Stand by a sink and tilt your head sideways over the sink. ?Place the spout of the device in your upper nostril (the one closer to the ceiling). ?Gently pour or squeeze the saline solution into your nasal cavity. The liquid should drain out from the lower nostril if you are not too congested. ?While rinsing, breathe through your open mouth. ?Gently blow your nose to clear any mucus and rinse solution. Blowing too hard may cause ear pain. ?Turn your head in the other direction and repeat in your other nostril. ?Clean and rinse your device with clean water and then air-dry it. ?Talk with your health care provider or pharmacist if you have questions about how to do a sinus rinse. ?Summary ?A sinus rinse is a home treatment that is used to rinse your sinuses with a sterile mixture of salt and water (saline solution). ?You may do a sinus rinse when you have a cold, a virus, nasal allergy symptoms, a sinus infection, or stuffiness in your nose or sinuses. ?A sinus rinse is generally safe and effective. Follow all instructions carefully. ?This information is not intended to replace advice given to you by your health care provider. Make sure you discuss any questions you have with your health care provider. ?Document Revised: 05/15/2021 Document Reviewed: 05/15/2021 ?Elsevier  Patient Education ? 2022 [employer] ?Sinusitis, Adult ?Sinusitis is inflammation of your sinuses. Sinuses are hollow spaces in the bones around your face. Your sinuses are located: ?Around your eyes. ?In the middle of your forehead. ?Behind your nose. ?In your cheekbones. ?Mucus normally drains out of your sinuses. When your nasal tissues become inflamed or swollen, mucus can become trapped or blocked. This allows bacteria, viruses, and fungi to grow,  which leads to infection. Most infections of the sinuses are caused by a virus. ?Sinusitis can develop quickly. It can last for up to 4 weeks (acute) or for more than 12 weeks (chronic). Sinusitis often develops after a cold. ?What are the causes? ?This condition is caused by anything that creates swelling in the sinuses or stops mucus from draining. This includes: ?Allergies. ?Asthma. ?Infection from bacteria or viruses. ?Deformities or blockages in your nose or sinuses. ?Abnormal growths in the nose (nasal polyps). ?Pollutants, such as chemicals or irritants in the air. ?Infection from fungi (rare). ?What increases the risk? ?You are more likely to develop this condition if you: ?Have a weak body defense system (immune system). ?Do a lot of swimming or diving. ?Overuse nasal sprays. ?Smoke. ?What are the signs or symptoms? ?The main symptoms of this condition are pain and a feeling of pressure around the affected sinuses. Other symptoms include: ?Stuffy nose or congestion. ?Thick drainage from your nose. ?Swelling and warmth over the affected sinuses. ?Headache. ?Upper toothache. ?A cough that may get worse at night. ?Extra mucus that collects in the throat or the back of the nose (postnasal drip). ?Decreased sense of smell and taste. ?Fatigue. ?A fever. ?Sore throat. ?Bad breath. ?How is this diagnosed? ?This condition is diagnosed based on: ?Your symptoms. ?Your medical history. ?A physical exam. ?Tests to find out if your condition is acute or chronic. This may include: ?Checking your nose for nasal polyps. ?Viewing your sinuses using a device that has a light (endoscope). ?Testing for allergies or bacteria. ?Imaging tests, such as an MRI or CT scan. ?In rare cases, a bone biopsy may be done to rule out more serious types of fungal sinus disease. ?How is this treated? ?Treatment for sinusitis depends on the cause and whether your condition is chronic or acute. ?If caused by a virus, your symptoms should go away  on their own within 10 days. You may be given medicines to relieve symptoms. They include: ?Medicines that shrink swollen nasal passages (topical intranasal decongestants). ?Medicines that treat allergies (antihistamines). ?A spray that eases inflammation of the nostrils (topical intranasal corticosteroids). ?Rinses that help get rid of thick mucus in your nose (nasal saline washes). ?If caused by bacteria, your health care provider may recommend waiting to see if your symptoms improve. Most bacterial infections will get better without antibiotic medicine. You may be given antibiotics if you have: ?A severe infection. ?A weak immune system. ?If caused by narrow nasal passages or nasal polyps, you may need to have surgery. ?Follow these instructions at home: ?Medicines ?Take, use, or apply over-the-counter and prescription medicines only as told by your health care provider. These may include nasal sprays. ?If you were prescribed an antibiotic medicine, take it as told by your health care provider. Do not stop taking the antibiotic even if you start to feel better. ?Hydrate and humidify ? ?Drink enough fluid to keep your urine pale yellow. Staying hydrated will help to thin your mucus. ?Use a cool mist humidifier to keep the humidity level in your home above 50%. ?  Inhale steam for 10-15 minutes, 3-4 times a day, or as told by your health care provider. You can do this in the bathroom while a hot shower is running. ?Limit your exposure to cool or dry air. ?Rest ?Rest as much as possible. ?Sleep with your head raised (elevated). ?Make sure you get enough sleep each night. ?General instructions ? ?Apply a warm, moist washcloth to your face 3-4 times a day or as told by your health care provider. This will help with discomfort. ?Wash your hands often with soap and water to reduce your exposure to germs. If soap and water are not available, use hand sanitizer. ?Do not smoke. Avoid being around people who are smoking  (secondhand smoke). ?Keep all follow-up visits as told by your health care provider. This is important. ?Contact a health care provider if: ?You have a fever. ?Your symptoms get worse. ?Your symptoms do n

## 2022-03-06 NOTE — Progress Notes (Signed)
? ?Subjective:  ? ? Patient ID: Ryan Glass, male    DOB: 08/07/1956, 66 y.o.   MRN: [id number] ? ?65y/o established [place] married male pt c/o runny nose, watery eyes, sinus congestion, nonproductive cough and body aches. Symptoms started yesterday. Home covid test performed today was negative.   Denied known sick contacts.  Has not been taking any oral antihistamines only flonase, nasal saline, singulair daily.  Cardiopulmonary therapy earlier this week went well.  Has started using voltaren gel topical for knee pain as OTC tylenol not helping along with cream.   Has seen rheumatology and referred to pain management.  Patient also self-referred to orthopedics and appts scheduled/pending. Denied chest pain/dizziness/visual changes/palpitations/shortness of breath.  Patient feels that therapy has been helping his dyspnea with exertion and sp02 checks at rest have been improving and not going as low as before into 80s and staying in 90s now  Home am weight 244lbs before eating. ? ? ? ? ?Review of Systems  ?Constitutional:  Positive for fatigue. Negative for activity change, appetite change, chills, diaphoresis and fever.  ?HENT:  Positive for congestion, postnasal drip, rhinorrhea, sinus pressure and sinus pain. Negative for dental problem, drooling, ear discharge, ear pain, facial swelling, hearing loss, mouth sores, nosebleeds, sneezing, sore throat, tinnitus, trouble swallowing and voice change.   ?Eyes:  Positive for discharge. Negative for photophobia, pain, redness, itching and visual disturbance.  ?Respiratory:  Positive for cough. Negative for choking, chest tightness, shortness of breath, wheezing and stridor.   ?Cardiovascular:  Positive for leg swelling. Negative for chest pain and palpitations.  ?Gastrointestinal:  Negative for abdominal pain, blood in stool, diarrhea, nausea and vomiting.  ?Endocrine: Negative for cold intolerance and heat intolerance.  ?Genitourinary:  Negative for difficulty  urinating.  ?Musculoskeletal:  Positive for arthralgias and gait problem. Negative for neck stiffness.  ?Skin:  Negative for rash.  ?Allergic/Immunologic: Positive for environmental allergies. Negative for food allergies.  ?Neurological:  Negative for dizziness, tremors, seizures, syncope, facial asymmetry, speech difficulty, weakness, light-headedness, numbness and headaches.  ?Hematological:  Negative for adenopathy. Does not bruise/bleed easily.  ?Psychiatric/Behavioral:  Negative for agitation, confusion and sleep disturbance.   ? ?   ?Objective:  ? Physical Exam ?Vitals and nursing note reviewed.  ?Constitutional:   ?   General: He is awake. He is not in acute distress. ?   Appearance: He is well-developed and well-groomed. He is obese. He is ill-appearing. He is not toxic-appearing or diaphoretic.  ?HENT:  ?   Head: Normocephalic and atraumatic. No right periorbital erythema.  ?   Jaw: There is normal jaw occlusion. No trismus.  ?   Salivary Glands: Right salivary gland is not diffusely enlarged or tender. Left salivary gland is not diffusely enlarged or tender.  ?   Right Ear: Hearing, ear canal and external ear normal. No decreased hearing noted. A middle ear effusion is present.  ?   Left Ear: Hearing, ear canal and external ear normal. No decreased hearing noted. A middle ear effusion is present.  ?   Nose: Mucosal edema, congestion and rhinorrhea present. No nasal deformity, septal deviation or laceration. Rhinorrhea is clear.  ?   Right Turbinates: Enlarged and swollen. Not pale.  ?   Left Turbinates: Enlarged and swollen. Not pale.  ?   Right Sinus: Maxillary sinus tenderness and frontal sinus tenderness present.  ?   Left Sinus: Maxillary sinus tenderness and frontal sinus tenderness present.  ?   Mouth/Throat:  ?  Lips: Pink. No lesions.  ?   Mouth: Mucous membranes are moist. Mucous membranes are not pale, not dry and not cyanotic. No lacerations, oral lesions or angioedema.  ?   Dentition: Abnormal  dentition. Has dentures. No dental abscesses or gum lesions.  ?   Tongue: No lesions. Tongue does not deviate from midline.  ?   Palate: No mass and lesions.  ?   Pharynx: Uvula midline. Pharyngeal swelling and posterior oropharyngeal erythema present. No oropharyngeal exudate or uvula swelling.  ?   Tonsils: No tonsillar exudate or tonsillar abscesses. 0 on the right. 0 on the left.  ?   Comments: Cobblestoning posterior pharynx; bilateral TMs air fluid level clear; bilateral allergic shiners and lower eyelid swelling 1-2+/4; nasal congestion audible; maxillary and frontal sinuses TTP bilaterally equally ?Eyes:  ?   General: Lids are normal. Vision grossly intact. Gaze aligned appropriately. Allergic shiner present. No scleral icterus.    ?   Right eye: No foreign body, discharge or hordeolum.     ?   Left eye: No foreign body, discharge or hordeolum.  ?   Extraocular Movements: Extraocular movements intact.  ?   Right eye: Normal extraocular motion and no nystagmus.  ?   Left eye: Normal extraocular motion and no nystagmus.  ?   Conjunctiva/sclera: Conjunctivae normal.  ?   Right eye: Right conjunctiva is not injected. No chemosis, exudate or hemorrhage. ?   Left eye: Left conjunctiva is not injected. No chemosis, exudate or hemorrhage. ?   Pupils: Pupils are equal, round, and reactive to light. Pupils are equal.  ?   Right eye: Pupil is round and reactive.  ?   Left eye: Pupil is round and reactive.  ?Neck:  ?   Thyroid: No thyroid mass or thyromegaly.  ?   Trachea: Trachea and phonation normal. No tracheal tenderness or tracheal deviation.  ?Cardiovascular:  ?   Rate and Rhythm: Normal rate and regular rhythm.  ?   Pulses: Normal pulses.     ?     Radial pulses are 2+ on the right side and 2+ on the left side.  ?   Heart sounds: Normal heart sounds, S1 normal and S2 normal. No murmur heard. ?  No friction rub.  ?   Comments: Nonpitting lower leg edema ?Pulmonary:  ?   Effort: Pulmonary effort is normal. No  respiratory distress.  ?   Breath sounds: Decreased air movement present. No stridor or transmitted upper airway sounds. Examination of the right-lower field reveals decreased breath sounds. Examination of the left-lower field reveals decreased breath sounds. Decreased breath sounds present. No wheezing, rhonchi or rales.  ?   Comments: Spoke full sentences without difficulty; no cough observed in exam room ?Abdominal:  ?   General: There is no distension.  ?   Palpations: Abdomen is soft.  ?Musculoskeletal:     ?   General: No tenderness. Normal range of motion.  ?   Cervical back: Normal range of motion and neck supple. No edema, erythema, signs of trauma, rigidity, torticollis, tenderness or crepitus. No pain with movement, spinous process tenderness or muscular tenderness. Normal range of motion.  ?   Right lower leg: 1+ Edema present.  ?   Left lower leg: 1+ Edema present.  ?Lymphadenopathy:  ?   Head:  ?   Right side of head: No submental, submandibular, tonsillar, preauricular, posterior auricular or occipital adenopathy.  ?   Left side of head: No submental, submandibular,  tonsillar, preauricular, posterior auricular or occipital adenopathy.  ?   Cervical: No cervical adenopathy.  ?   Right cervical: No superficial, deep or posterior cervical adenopathy. ?   Left cervical: No superficial, deep or posterior cervical adenopathy.  ?Skin: ?   General: Skin is warm and dry.  ?   Capillary Refill: Capillary refill takes less than 2 seconds.  ?   Coloration: Skin is not ashen, cyanotic, jaundiced, mottled, pale or sallow.  ?   Findings: No abrasion, abscess, acne, bruising, burn, ecchymosis, erythema, signs of injury, laceration, lesion, petechiae, rash or wound.  ?   Nails: There is no clubbing.  ?Neurological:  ?   General: No focal deficit present.  ?   Mental Status: He is alert and oriented to person, place, and time. Mental status is at baseline.  ?   GCS: GCS eye subscore is 4. GCS verbal subscore is 5. GCS  motor subscore is 6.  ?   Cranial Nerves: Cranial nerves 2-12 are intact. No cranial nerve deficit, dysarthria or facial asymmetry.  ?   Sensory: No sensory deficit.  ?   Motor: Motor function is intact. No wea

## 2022-03-07 ENCOUNTER — Telehealth: Payer: Self-pay | Admitting: Registered Nurse

## 2022-03-07 NOTE — Telephone Encounter (Signed)
Patient returned call had orthopedics evaluation of knees today.  Saw provider therapist recommended.  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  He was told knee replacement recommended and typically would last 30 years.  X-rays knees performed by provider today.  Discussed he would need to complete physical therapy and get clearance from pulmonology and cardiology prior to scheduling surgery.  Patient reported knee bone on bone both sides.  Patient reported URI symptoms improved today.  Plans to go back to work tomorrow.  A&Ox3 spoke full sentences without difficulty, no audible cough/congestion/throat clearing during 6 minute call.  Discussed with patient I would follow up with him tomorrow in workcenter for re-evaluation.  Patient verbalized understanding information/instructions, agreed with plan of care and had no further questions at this time. ?

## 2022-03-07 NOTE — Progress Notes (Signed)
Pulmonary Individual Treatment Plan ? ?Patient Details  ?Name: Ryan Glass ?MRN: [id number] ?Date of Birth: 1956/03/21 ?Referring Provider:   ?Flowsheet Row Pulmonary Rehab Walk Test from 02/09/2022 in [place]  ?Referring Provider Dewald  ? ?  ? ? ?Initial Encounter Date:  ?Flowsheet Row Pulmonary Rehab Walk Test from 02/09/2022 in [place]  ?Date 02/09/22  ? ?  ? ? ?Visit Diagnosis: Chronic respiratory failure with hypoxia (HCC) ? ?Patient's Home Medications on Admission:  ? ?Current Outpatient Medications:  ?  Abatacept (ORENCIA CLICKJECT) 0000000 MG/ML SOAJ, Inject into the skin once a week., Disp: , Rfl:  ?  Acetaminophen-Codeine 300-30 MG tablet, Take 1-2 tablets by mouth every 12 (twelve) hours as needed for pain., Disp: , Rfl:  ?  albuterol (VENTOLIN HFA) 108 (90 Base) MCG/ACT inhaler, Inhale 2 puffs into the lungs every 4 (four) hours as needed for wheezing or shortness of breath., Disp: 8 g, Rfl: 2 ?  apixaban (ELIQUIS) 5 MG TABS tablet, Take 1 tablet by mouth 2 (two) times daily., Disp: , Rfl:  ?  atorvastatin (LIPITOR) 40 MG tablet, Take 1 tablet (40 mg total) by mouth daily., Disp: 90 tablet, Rfl: 0 ?  colchicine 0.6 MG tablet, Take 1-2 tablets by mouth daily., Disp: , Rfl:  ?  diclofenac Sodium (VOLTAREN) 1 % GEL, Apply topically., Disp: , Rfl:  ?  fluticasone (FLONASE) 50 MCG/ACT nasal spray, Place 1 spray into both nostrils daily., Disp: , Rfl:  ?  fluticasone-salmeterol (ADVAIR) 250-50 MCG/ACT AEPB, , Disp: , Rfl:  ?  folic acid (FOLVITE) 1 MG tablet, Take 1 mg by mouth daily., Disp: , Rfl:  ?  furosemide (LASIX) 40 MG tablet, Take 1 tablet (40 mg total) by mouth daily as needed (weight gain). Take 1 tablet by mouth daily for 3 days, then decrease to only as needed for increase weight gain, Disp: 30 tablet, Rfl: 3 ?  gabapentin (NEURONTIN) 600 MG tablet, Take 600 mg by mouth with breakfast, with lunch, and with evening meal., Disp: , Rfl:   ?  hydrALAZINE (APRESOLINE) 100 MG tablet, Take 100 mg by mouth 3 (three) times daily., Disp: , Rfl:  ?  loratadine (CLARITIN) 10 MG tablet, Take 10 mg by mouth daily., Disp: , Rfl:  ?  Magnesium Oxide 400 MG CAPS, Take 1 capsule (400 mg total) by mouth every other day. While taking daily multivitamin with 100mg  magnesium every day, Disp: 90 capsule, Rfl: 3 ?  montelukast (SINGULAIR) 10 MG tablet, Take 1 tablet (10 mg total) by mouth at bedtime., Disp: 90 tablet, Rfl: 3 ?  nebivolol (BYSTOLIC) 5 MG tablet, Take 1 tablet (5 mg total) by mouth daily., Disp: 90 tablet, Rfl: 3 ?  pantoprazole (PROTONIX) 40 MG tablet, Take 1 tablet (40 mg total) by mouth 2 (two) times daily., Disp: 60 tablet, Rfl: 1 ?  potassium chloride SA (KLOR-CON M) 20 MEQ tablet, Take 1 tablet by  mouth only when you have to use a Lasix, Disp: 90 tablet, Rfl: 3 ?  predniSONE (DELTASONE) 10 MG tablet, Take 2 tablets (20 mg total) by mouth daily with breakfast for 2 days, THEN 1 tablet (10 mg total) daily with breakfast for 2 days, THEN 0.5 tablets (5 mg total) daily with breakfast for 2 days., Disp: 21 tablet, Rfl: 0 ?  sildenafil (REVATIO) 20 MG tablet, Take 20 mg by mouth daily as needed (erectile dysfunction)., Disp: , Rfl:  ?  sotalol (BETAPACE)  120 MG tablet, Take 1 tablet (120 mg total) by mouth every 12 (twelve) hours., Disp: 60 tablet, Rfl: 6 ?  spironolactone (ALDACTONE) 25 MG tablet, Take 1 tablet (25 mg total) by mouth daily., Disp: 30 tablet, Rfl: 6 ? ?Past Medical History: ?Past Medical History:  ?Diagnosis Date  ? Ascending aortic aneurysm   ? a. CT 05/2021 showed 4.4x4.4cm TAA. Stable on chest CTA 12/2021 at 4.4cm.  ? Bilateral lower extremity edema   ? Celiac artery dilatation (HCC)   ? 1.8cm at the celiac trunk on Chest CT 12/2021  ? Chronic gout without tophus   ? followed by dr Gerilyn Nestle    (06-08-2020  per pt last episode right knee 3 wks ago)  ? CKD (chronic kidney disease), stage II   ? nephrology--- Jen Mow PA  (10-29-2019 note in epic scanned in  media)  ? Heart failure with mid-range ejection fraction ([place]) 05/27/2021  ? 05/26/21: Left ventricular ejection fraction, by estimation, is 45 to 50%. There is severe asymmetric left ventricular hypertrophy. G1DD present.   ? History of COVID-19 06/07/2021  ? HOCM (hypertrophic obstructive cardiomyopathy) ([place])   ? s/p ICD 07/2021  ? Hypertension   ? followed by cardiology, dr t. turner   (05-14-2018 nuclear study in epic , normal perfusion with nuclear ef 61%)  ? Left hydrocele   ? Low serum potassium level 04/27/2014  ? OSA on CPAP   ? per pt uses every night  ? Palpitations followed by dr t. turner  ? 06-08-2020  still feels palipations due to PVCs when exertion but not with chest pain/ discomfort  ? Pneumonia due to COVID-19 virus   ? PVC's (premature ventricular contractions) cardiologist--- dr t. turner  ? Status post PVC ablation by [name] 2019 with recurrence of frequent PVCs/bigeminy;  prior pseudobradycardia r/t pvcs  ? Rheumatoid arthritis involving multiple sites [facility])   ? rheumotology--- dr a. Gerilyn Nestle  ([facility] in [place])  ? ? ?Tobacco Use: ?Social History  ? ?Tobacco Use  ?Smoking Status Never  ?Smokeless Tobacco Never  ? ? ?Labs: ?Review Flowsheet   ? ?  ?  Latest Ref Rng & Units 01/05/2021 02/20/2021 05/27/2021 05/29/2021  ?Labs for ITP Cardiac and Pulmonary Rehab  ?Cholestrol 0 - 200 mg/dL 157   136   123     ?LDL (calc) 0 - 99 mg/dL 87   60   61     ?HDL-C >40 mg/dL 58   64   49     ?Trlycerides <150 mg/dL 58   55   67     ?Hemoglobin A1c 4.8 - 5.6 %  5.3      ?PH, Arterial 7.350 - 7.450    7.430    ?PCO2 arterial 32.0 - 48.0 mmHg    33.4    ?Bicarbonate 20.0 - 28.0 mmol/L    22.2    ? 18.3    ?TCO2 22 - 32 mmol/L    23    ? 19    ?Acid-base deficit 0.0 - 2.0 mmol/L    2.0    ? 6.0    ?O2 Saturation %    99.0    ? 71.0    ? ?  10/07/2021  ?Labs for ITP Cardiac and Pulmonary Rehab  ?Cholestrol   ?LDL (calc)   ?HDL-C   ?Trlycerides   ?Hemoglobin A1c   ?PH, Arterial    ?PCO2 arterial   ?Bicarbonate 19.5    ? 19.1    ?TCO2  19    ? 20    ?Acid-base deficit 4.3    ? 4.0    ?O2 Saturation 82.3    ? 90.0    ?  ? ? Multiple values from one day are sorted in reverse-chronological order  ?  ?  ? ? ?Capillary Blood Glucose: ?No results found for: GLUCAP ? ? ?Pulmonary Assessment Scores: ? Pulmonary Assessment Scores   ? ? [place] Name 02/09/22 1109  ?  ?  ?  ? ADL UCSD  ? ADL Phase Entry    ? SOB Score total 74    ?  ? CAT Score  ? CAT Score 17    ?  ? mMRC Score  ? mMRC Score 4    ? ?  ?  ? ?  ? ?UCSD: ?Self-administered rating of dyspnea associated with activities of daily living (ADLs) ?6-point scale (0 = "not at all" to 5 = "maximal or unable to do because of breathlessness")  ?Scoring Scores range from 0 to 120.  Minimally important difference is 5 units ? ?CAT: ?CAT can identify the health impairment of COPD patients and is better correlated with disease progression.  ?CAT has a scoring range of zero to 40. The CAT score is classified into four groups of low (less than 10), medium (10 - 20), high (21-30) and very high (31-40) based on the impact level of disease on health status. A CAT score over 10 suggests significant symptoms.  A worsening CAT score could be explained by an exacerbation, poor medication adherence, poor inhaler technique, or progression of COPD or comorbid conditions.  ?CAT MCID is 2 points ? ?mMRC: ?mMRC (Modified Medical Research Council) Dyspnea Scale is used to assess the degree of baseline functional disability in patients of respiratory disease due to dyspnea. ?No minimal important difference is established. A decrease in score of 1 point or greater is considered a positive change.  ? ?Pulmonary Function Assessment: ? Pulmonary Function Assessment - 02/09/22 1112   ? ?  ? Breath  ? Bilateral Breath Sounds Clear   ? Shortness of Breath Yes;Limiting activity;Fear of Shortness of Breath   ? ?  ?  ? ?  ? ? ?Exercise Target Goals: ?Exercise Program Goal: ?Individual  exercise prescription set using results from initial 6 min walk test and THRR while considering  patient?s activity barriers and safety.  ? ?Exercise Prescription Goal: ?Initial exercise prescription builds to 30

## 2022-03-08 ENCOUNTER — Encounter (HOSPITAL_COMMUNITY): Payer: No Typology Code available for payment source

## 2022-03-08 ENCOUNTER — Encounter (HOSPITAL_COMMUNITY)
Admission: RE | Admit: 2022-03-08 | Discharge: 2022-03-08 | Disposition: A | Discharge status: Home / Self Care | Payer: No Typology Code available for payment source | Source: Ambulatory Visit | Attending: Pulmonary Disease | Admitting: Pulmonary Disease

## 2022-03-08 DIAGNOSIS — J9611 Chronic respiratory failure with hypoxia: Secondary | ICD-10-CM

## 2022-03-08 NOTE — Progress Notes (Signed)
Daily Session Note ? ?Patient Details  ?Name: Ryan Glass ?MRN: [id number] ?Date of Birth: 04-Dec-1956 ?Referring Provider:   ?Flowsheet Row Pulmonary Rehab Walk Test from 02/09/2022 in [place]  ?Referring Provider Dewald  ? ?  ? ? ?Encounter Date: 03/08/2022 ? ?Check In: ? Session Check In - 03/08/22 1333   ? ?  ? Check-In  ? Supervising physician immediately available to respond to emergencies [facility] immediately available   ? Physician(s) Dr. Maryland Pink   ? Location [facility]   ? Staff Present Elmon Else, MS, ACSM-CEP, Exercise Physiologist;Lisa Ysidro Evert, RN;Other   ? Virtual Visit No   ? Medication changes reported     No   ? Fall or balance concerns reported    No   ? Tobacco Cessation No Change   ? Warm-up and Cool-down Performed on first and last piece of equipment   ? Resistance Training Performed Yes   ? VAD Patient? No   ? PAD/SET Patient? No   ?  ? Pain Assessment  ? Currently in Pain? Yes   ? Pain Location Knee   ? Pain Orientation Right;Left   ? Pain Descriptors / Indicators Constant;Aching   ? Pain Type Chronic pain   ? Multiple Pain Sites No   ? ?  ?  ? ?  ? ? ?Capillary Blood Glucose: ?No results found for this or any previous visit (from the past 24 hour(s)). ? ? ? ?Social History  ? ?Tobacco Use  ?Smoking Status Never  ?Smokeless Tobacco Never  ? ? ?Goals Met:  ?Proper associated with RPD/PD & O2 Sat ?Independence with exercise equipment ?Exercise tolerated well ?No report of concerns or symptoms today ?Strength training completed today ? ?Goals Unmet:  ?Not Applicable ? ?Comments: Service time is from 1318 to 1437.  ? ? ?Dr. Rodman Pickle is Medical Director for Pulmonary Rehab at [facility].  ?

## 2022-03-11 NOTE — Telephone Encounter (Signed)
Patient contacted via telephone.  He stated home covid test negative today.  Still some congestion but feeling better.  Asking how long recovery takes after knee replacement surgery and discussed with patient other employees have been out 6-8 weeks.  Discussed with patient body needs to heal and working on muscle flexibility and strength after surgery again because muscles manipulated during surgery and typically tight.  Patient A&Ox3 spoke full sentences without difficulty no audible cough/throat clearing some nasal congestion noted.  This is his second week of symptoms.  Patient plans to be at work tomorrow.  Patient verbalized understanding information/instructions and had no further questions at this time. ?

## 2022-03-13 ENCOUNTER — Encounter (HOSPITAL_COMMUNITY)
Admission: RE | Admit: 2022-03-13 | Discharge: 2022-03-13 | Disposition: A | Discharge status: Home / Self Care | Payer: No Typology Code available for payment source | Source: Ambulatory Visit | Attending: Pulmonary Disease | Admitting: Pulmonary Disease

## 2022-03-13 ENCOUNTER — Encounter (HOSPITAL_COMMUNITY): Payer: No Typology Code available for payment source

## 2022-03-13 VITALS — Wt 244.3 lb

## 2022-03-13 DIAGNOSIS — Z7952 Long term (current) use of systemic steroids: Secondary | ICD-10-CM | POA: Insufficient documentation

## 2022-03-13 DIAGNOSIS — I712 Thoracic aortic aneurysm, without rupture, unspecified: Secondary | ICD-10-CM | POA: Diagnosis not present

## 2022-03-13 DIAGNOSIS — E1122 Type 2 diabetes mellitus with diabetic chronic kidney disease: Secondary | ICD-10-CM | POA: Insufficient documentation

## 2022-03-13 DIAGNOSIS — Z86711 Personal history of pulmonary embolism: Secondary | ICD-10-CM | POA: Diagnosis not present

## 2022-03-13 DIAGNOSIS — E669 Obesity, unspecified: Secondary | ICD-10-CM | POA: Insufficient documentation

## 2022-03-13 DIAGNOSIS — Z8616 Personal history of COVID-19: Secondary | ICD-10-CM | POA: Insufficient documentation

## 2022-03-13 DIAGNOSIS — M069 Rheumatoid arthritis, unspecified: Secondary | ICD-10-CM | POA: Insufficient documentation

## 2022-03-13 DIAGNOSIS — Z7901 Long term (current) use of anticoagulants: Secondary | ICD-10-CM | POA: Insufficient documentation

## 2022-03-13 DIAGNOSIS — I130 Hypertensive heart and chronic kidney disease with heart failure and stage 1 through stage 4 chronic kidney disease, or unspecified chronic kidney disease: Secondary | ICD-10-CM | POA: Insufficient documentation

## 2022-03-13 DIAGNOSIS — N182 Chronic kidney disease, stage 2 (mild): Secondary | ICD-10-CM | POA: Insufficient documentation

## 2022-03-13 DIAGNOSIS — I5042 Chronic combined systolic (congestive) and diastolic (congestive) heart failure: Secondary | ICD-10-CM | POA: Diagnosis not present

## 2022-03-13 DIAGNOSIS — I5082 Biventricular heart failure: Secondary | ICD-10-CM | POA: Insufficient documentation

## 2022-03-13 DIAGNOSIS — M25569 Pain in unspecified knee: Secondary | ICD-10-CM | POA: Insufficient documentation

## 2022-03-13 DIAGNOSIS — Z79899 Other long term (current) drug therapy: Secondary | ICD-10-CM | POA: Insufficient documentation

## 2022-03-13 DIAGNOSIS — I2729 Other secondary pulmonary hypertension: Secondary | ICD-10-CM | POA: Diagnosis not present

## 2022-03-13 DIAGNOSIS — I2510 Atherosclerotic heart disease of native coronary artery without angina pectoris: Secondary | ICD-10-CM | POA: Insufficient documentation

## 2022-03-13 DIAGNOSIS — J9611 Chronic respiratory failure with hypoxia: Secondary | ICD-10-CM

## 2022-03-13 DIAGNOSIS — I4510 Unspecified right bundle-branch block: Secondary | ICD-10-CM | POA: Insufficient documentation

## 2022-03-13 DIAGNOSIS — I480 Paroxysmal atrial fibrillation: Secondary | ICD-10-CM | POA: Diagnosis not present

## 2022-03-13 DIAGNOSIS — Z95810 Presence of automatic (implantable) cardiac defibrillator: Secondary | ICD-10-CM | POA: Diagnosis not present

## 2022-03-13 DIAGNOSIS — Q2112 Patent foramen ovale: Secondary | ICD-10-CM | POA: Insufficient documentation

## 2022-03-13 DIAGNOSIS — Z01810 Encounter for preprocedural cardiovascular examination: Secondary | ICD-10-CM | POA: Diagnosis not present

## 2022-03-13 DIAGNOSIS — R0609 Other forms of dyspnea: Secondary | ICD-10-CM | POA: Insufficient documentation

## 2022-03-13 DIAGNOSIS — R9439 Abnormal result of other cardiovascular function study: Secondary | ICD-10-CM | POA: Insufficient documentation

## 2022-03-13 NOTE — Progress Notes (Signed)
Daily Session Note ? ?Patient Details  ?Name: Ryan Glass ?MRN: [id number] ?Date of Birth: [birth year] ?Referring Provider:   ?Flowsheet Row Pulmonary Rehab Walk Test from 02/09/2022 in [place]  ?Referring Provider Dewald  ? ?  ? ? ?Encounter Date: 03/13/2022 ? ?Check In: ? Session Check In - 03/13/22 1456   ? ?  ? Check-In  ? Supervising physician immediately available to respond to emergencies [facility] immediately available   ? Physician(s) Dr. Maryland Pink   ? [facility]   ? Staff Present Elmon Else, MS, ACSM-CEP, Exercise Physiologist;Mikyah Alamo Ysidro Evert, RN;David Lilyan Punt, MS, ACSM-CEP, CCRP, Exercise Physiologist   ? Virtual Visit No   ? Medication changes reported     No   ? Fall or balance concerns reported    No   ? Tobacco Cessation No Change   ? Warm-up and Cool-down Performed on first and last piece of equipment   ? Resistance Training Performed Yes   ? VAD Patient? No   ? PAD/SET Patient? No   ?  ? Pain Assessment  ? Currently in Pain? Yes   ? Pain Score 6    ? Pain Location Knee   ? Pain Orientation Right;Left   ? Pain Descriptors / Indicators Aching   ? Pain Type Chronic pain   ? Pain Onset More than a month ago   ? Pain Frequency Intermittent   ? Multiple Pain Sites No   ? ?  ?  ? ?  ? ? ?Capillary Blood Glucose: ?No results found for this or any previous visit (from the past 24 hour(s)). ? ? Exercise Prescription Changes - 03/13/22 1500   ? ?  ? Response to Exercise  ? Blood Pressure (Admit) 122/84   ? Blood Pressure (Exercise) 140/90   ? Blood Pressure (Exit) 106/60   ? Heart Rate (Admit) 81 bpm   ? Heart Rate (Exercise) 83 bpm   ? Heart Rate (Exit) 85 bpm   ? Oxygen Saturation (Admit) 93 %   ? Oxygen Saturation (Exercise) 93 %   ? Oxygen Saturation (Exit) 91 %   ? Rating of Perceived Exertion (Exercise) 11   ? Perceived Dyspnea (Exercise) 2   ? Duration Continue with 30 min of aerobic exercise without signs/symptoms of physical distress.   ?  Intensity THRR unchanged   ?  ? Progression  ? Progression Continue to progress workloads to maintain intensity without signs/symptoms of physical distress.   ?  ? Resistance Training  ? Training Prescription Yes   ? Weight Bule bands   ? Reps 10-15   ? Time 10 Minutes   ?  ? NuStep  ? Level 2   ? SPM 80   ? Minutes 15   ? METs 2.1   ? ?  ?  ? ?  ? ? ?Social History  ? ?Tobacco Use  ?Smoking Status Never  ?Smokeless Tobacco Never  ? ? ?Goals Met:  ?Exercise tolerated well ?No report of concerns or symptoms today ?Strength training completed today ? ?Goals Unmet:  ?Not Applicable ? ?Comments: Service time is from 1331 to 1445 ? ? ? ?Dr. Rodman Pickle is Medical Director for Pulmonary Rehab at [facility].  ?

## 2022-03-14 ENCOUNTER — Telehealth: Payer: Self-pay | Admitting: Adult Health

## 2022-03-14 NOTE — Telephone Encounter (Signed)
Epic review noted patient kept rehab appt yesterday afternoon.  Nustep and resistance training completed. ?

## 2022-03-15 ENCOUNTER — Encounter (HOSPITAL_COMMUNITY): Payer: No Typology Code available for payment source

## 2022-03-15 ENCOUNTER — Ambulatory Visit (INDEPENDENT_AMBULATORY_CARE_PROVIDER_SITE_OTHER): Payer: No Typology Code available for payment source

## 2022-03-15 DIAGNOSIS — I472 Ventricular tachycardia, unspecified: Secondary | ICD-10-CM

## 2022-03-15 LAB — CUP PACEART INCLINIC DEVICE CHECK
Date Time Interrogation Session: 20230406163313
Implantable Lead Implant Date: 20220829
Implantable Lead Implant Date: 20220829
Implantable Lead Location: 753859
Implantable Lead Location: 753860
Implantable Pulse Generator Implant Date: 20220829
Pulse Gen Serial Number: 810029878

## 2022-03-15 NOTE — Progress Notes (Signed)
Patient seen in device clinic to adjust RR. Threshold programmed from Auto (-0.5) to 1.5. Patient walked down the hallway and back into device clinic, rate increased from 80's to low 90's - low 100's.  ?

## 2022-03-15 NOTE — Patient Instructions (Signed)
Please call if you are not feeling improvements ? ?Device Clinic ?[phone] ?

## 2022-03-19 NOTE — Telephone Encounter (Signed)
Called and spoke with patient who states that Dr. Turner Danielsowan with [facility] is doing knee replacement surgery. Will call them tomorrow to request form ?

## 2022-03-20 ENCOUNTER — Encounter (HOSPITAL_COMMUNITY)
Admission: RE | Admit: 2022-03-20 | Discharge: 2022-03-20 | Disposition: A | Discharge status: Home / Self Care | Payer: No Typology Code available for payment source | Source: Ambulatory Visit | Attending: Pulmonary Disease | Admitting: Pulmonary Disease

## 2022-03-20 ENCOUNTER — Encounter (HOSPITAL_COMMUNITY): Payer: No Typology Code available for payment source

## 2022-03-20 DIAGNOSIS — J9611 Chronic respiratory failure with hypoxia: Secondary | ICD-10-CM

## 2022-03-20 DIAGNOSIS — Z01810 Encounter for preprocedural cardiovascular examination: Secondary | ICD-10-CM | POA: Diagnosis not present

## 2022-03-20 NOTE — Progress Notes (Addendum)
Ryan Glass 66 y.o. male ?Nutrition Note ?Ryan QuickJose is motivated to make lifestyle changes to aid with cardiac/pulmonary rehab. Patient has medical history of chronic respiratory failure with hypoxia, HTN, thoracic aortic aneurysm without rupture, heart failure with mid range ejection fraction, CAD, AF, OSA, acute on chronic respiratory failure. He is hoping to have a knee replacement in the near future. He lives at home with his wife. She has diabetes. They both enjoy sweets/sugar. They are not doing much cooking at this point and eating very few fruits or vegetables.  ? ?Labs: B12 343, vitamin D 24.4  ? ?24 hour recall:  ?Breakfast: 2 eggs, toast, coffee ?Lunch: leftovers or deli meat sandwich ?Dinner: out to [employer]-plain hamburger OR sandwich with cream cheese, jelly, coffee  ? ?Nutrition Diagnosis ?Inappropriate intake of saturated fats related excessive consumption of fatty meats and processed foods as evidenced by pt's diet recall  ?Excessive mineral intake (sodium) related to overconsumption of highly processed and restaurant foods as evidenced by diet recall and diagnosis of HTN ?Inadequate fiber intake related to lack of previous education about dietary fiber as evidenced by estimated daily fiber intake of 15 g. ? ?Nutrition Intervention ?Pt?s individual nutrition plan reviewed with pt. ?Benefits of adopting Heart Healthy diet discussed. ?Continue client-centered nutrition education by RD, as part of interdisciplinary care. ? ?Monitor/Evaluation: ?Patient reports motivation to make lifestyle changes for adherence to heart healthy diet recommendation, blood sugar control, and weight management. We discussed increasing dietary fiber through implementing regular fruit and vegetable intake. Handouts/notes given. Patient amicable to RD suggestions and verbalizes understanding. Will follow-up as needed.  ? ?10 minutes spent in review of topics related to a heart healthy diet including sodium intake, blood sugar  control, weight management, and fiber intake. ? ?Goal(s) ?Increase fiber intake. Choose >2 fruits and >3 vegetables servings per day.  ?Switch to whole grains from white grains/refined grains.  ?Pt to identify and limit food sources of saturated fat, trans fat, refined carbohydrates and sodium ?Pt to identify food quantities necessary to achieve weight loss of 6-24 lb at graduation from cardiac rehab.  ?Pt able to name foods that affect blood glucose. Continue to limit simple sugars, refined carbohydrates, sugary beverages, etc.  ?Pt to describe the benefit of including lean protein/plant proteins, fruits, vegetables, whole grains, nuts/seeds, and low-fat dairy products in a heart healthy meal plan. ?Pt to practice mindful and intuitive eating exercises ? ?Plan:  ?Will provide client-centered nutrition education as part of interdisciplinary care ?Monitor and evaluate progress toward nutrition goal with team. ? ? ?Lelon MastSamantha BelarusSpain, MS, RDN, LDN ? ?

## 2022-03-20 NOTE — Telephone Encounter (Signed)
Called and left message for Lurena Joiner at [facility] to fax over forms ?

## 2022-03-20 NOTE — Progress Notes (Signed)
Daily Session Note ? ?Patient Details  ?Name: Ryan Glass ?MRN: [id number] ?Date of Birth: 05-Mar-1956 ?Referring Provider:   ?Flowsheet Row Pulmonary Rehab Walk Test from 02/09/2022 in [place]  ?Referring Provider Dewald  ? ?  ? ? ?Encounter Date: 03/20/2022 ? ?Check In: ? Session Check In - 03/20/22 1429   ? ?  ? Check-In  ? Supervising physician immediately available to respond to emergencies [facility] immediately available   ? Physician(s) Dr. Maryland Pink   ? Location [facility]   ? Staff Present Elmon Else, MS, ACSM-CEP, Exercise Physiologist;Autumm Hattery Vicente Serene, MS, ACSM CEP, Exercise Physiologist   ? Virtual Visit No   ? Medication changes reported     No   ? Fall or balance concerns reported    No   ? Tobacco Cessation No Change   ? Warm-up and Cool-down Performed as group-led instruction   ? Resistance Training Performed Yes   ? VAD Patient? No   ? PAD/SET Patient? No   ?  ? Pain Assessment  ? Currently in Pain? No/denies   ? Multiple Pain Sites No   ? ?  ?  ? ?  ? ? ?Capillary Blood Glucose: ?No results found for this or any previous visit (from the past 24 hour(s)). ? ? ? ?Social History  ? ?Tobacco Use  ?Smoking Status Never  ?Smokeless Tobacco Never  ? ? ?Goals Met:  ?Exercise tolerated well ?No report of concerns or symptoms today ?Strength training completed today ? ?Goals Unmet:  ?Not Applicable ? ?Comments: Service time is from 1327 to 1433 ? ? ? ?Dr. Rodman Pickle is Medical Director for Pulmonary Rehab at [facility].  ?

## 2022-03-22 ENCOUNTER — Ambulatory Visit: Payer: Self-pay | Admitting: *Deleted

## 2022-03-22 ENCOUNTER — Encounter (HOSPITAL_COMMUNITY)
Admission: RE | Admit: 2022-03-22 | Discharge: 2022-03-22 | Disposition: A | Discharge status: Home / Self Care | Payer: No Typology Code available for payment source | Source: Ambulatory Visit | Attending: Pulmonary Disease | Admitting: Pulmonary Disease

## 2022-03-22 ENCOUNTER — Other Ambulatory Visit: Payer: Self-pay | Admitting: Registered Nurse

## 2022-03-22 ENCOUNTER — Encounter (HOSPITAL_COMMUNITY): Payer: No Typology Code available for payment source

## 2022-03-22 VITALS — BP 141/102 | Ht 68.0 in | Wt 242.0 lb

## 2022-03-22 DIAGNOSIS — Z0000 Encounter for general adult medical examination without abnormal findings: Secondary | ICD-10-CM

## 2022-03-22 DIAGNOSIS — Z6836 Body mass index (BMI) 36.0-36.9, adult: Secondary | ICD-10-CM

## 2022-03-22 DIAGNOSIS — J9611 Chronic respiratory failure with hypoxia: Secondary | ICD-10-CM

## 2022-03-22 DIAGNOSIS — E559 Vitamin D deficiency, unspecified: Secondary | ICD-10-CM

## 2022-03-22 DIAGNOSIS — I10 Essential (primary) hypertension: Secondary | ICD-10-CM

## 2022-03-22 DIAGNOSIS — Z01810 Encounter for preprocedural cardiovascular examination: Secondary | ICD-10-CM | POA: Diagnosis not present

## 2022-03-22 NOTE — Progress Notes (Signed)
Daily Session Note ? ?Patient Details  ?Name: Ryan Glass ?MRN: [id number] ?Date of Birth: 03-05-1956 ?Referring Provider:   ?Flowsheet Row Pulmonary Rehab Walk Test from 02/09/2022 in [place]  ?Referring Provider Dewald  ? ?  ? ? ?Encounter Date: 03/22/2022 ? ?Check In: ? Session Check In - 03/22/22 1422   ? ?  ? Check-In  ? Supervising physician immediately available to respond to emergencies [facility] immediately available   ? Physician(s) Dr. Maryland Pink   ? Location [facility]   ? Staff Present Rosebud Poles, RN, Milus Glazier, MS, ACSM-CEP, CCRP, Exercise Physiologist;Carlette Wilber Oliphant, RN, Quentin Ore, MS, ACSM-CEP, Exercise Physiologist   ? Virtual Visit No   ? Medication changes reported     No   ? Fall or balance concerns reported    No   ? Tobacco Cessation No Change   ? Warm-up and Cool-down Performed as group-led instruction   ? Resistance Training Performed Yes   ? VAD Patient? No   ? PAD/SET Patient? No   ?  ? Pain Assessment  ? Currently in Pain? No/denies   ? Multiple Pain Sites No   ? ?  ?  ? ?  ? ? ?Capillary Blood Glucose: ?No results found for this or any previous visit (from the past 24 hour(s)). ? ? ? ?Social History  ? ?Tobacco Use  ?Smoking Status Never  ?Smokeless Tobacco Never  ? ? ?Goals Met:  ?Proper associated with RPD/PD & O2 Sat ?Exercise tolerated well ?No report of concerns or symptoms today ?Strength training completed today ? ?Goals Unmet:  ?Not Applicable ? ?Comments: Service time is from 1320 to 1435 ? ? ? ?Dr. Rodman Pickle is Medical Director for Pulmonary Rehab at [facility].  ?

## 2022-03-22 NOTE — Progress Notes (Signed)
Be Well insurance premium discount evaluation: Labs Drawn. Replacements ROI form signed. Tobacco Free Attestation form signed.  Forms placed in paper chart.  

## 2022-03-23 ENCOUNTER — Encounter: Payer: Self-pay | Admitting: Registered Nurse

## 2022-03-23 ENCOUNTER — Telehealth: Payer: Self-pay | Admitting: *Deleted

## 2022-03-23 DIAGNOSIS — E559 Vitamin D deficiency, unspecified: Secondary | ICD-10-CM

## 2022-03-23 LAB — CMP12+LP+TP+TSH+6AC+PSA+CBC…
ALT: 24 IU/L (ref 0–44)
AST: 22 IU/L (ref 0–40)
Albumin/Globulin Ratio: 2.9 — ABNORMAL HIGH (ref 1.2–2.2)
Albumin: 4.4 g/dL (ref 3.8–4.8)
Alkaline Phosphatase: 83 IU/L (ref 44–121)
BUN/Creatinine Ratio: 13 (ref 10–24)
BUN: 15 mg/dL (ref 8–27)
Basophils Absolute: 0.1 10*3/uL (ref 0.0–0.2)
Basos: 1 %
Bilirubin Total: 0.8 mg/dL (ref 0.0–1.2)
Calcium: 9.1 mg/dL (ref 8.6–10.2)
Chloride: 106 mmol/L (ref 96–106)
Chol/HDL Ratio: 2.9 ratio (ref 0.0–5.0)
Cholesterol, Total: 144 mg/dL (ref 100–199)
Creatinine, Ser: 1.12 mg/dL (ref 0.76–1.27)
EOS (ABSOLUTE): 0.1 10*3/uL (ref 0.0–0.4)
Eos: 1 %
Estimated CHD Risk: <0.5 times avg. (ref 0.0–1.0)
Free Thyroxine Index: 1.7 (ref 1.2–4.9)
GGT: 27 IU/L (ref 0–65)
Globulin, Total: 1.5 g/dL (ref 1.5–4.5)
Glucose: 75 mg/dL (ref 70–99)
HDL: 49 mg/dL (ref >39)
Hematocrit: 41.4 % (ref 37.5–51.0)
Hemoglobin: 14 g/dL (ref 13.0–17.7)
Immature Grans (Abs): 0 10*3/uL (ref 0.0–0.1)
Immature Granulocytes: 0 %
Iron: 115 ug/dL (ref 38–169)
LDH: 193 IU/L (ref 121–224)
LDL Chol Calc (NIH): 71 mg/dL (ref 0–99)
Lymphocytes Absolute: 1.6 10*3/uL (ref 0.7–3.1)
Lymphs: 24 %
MCH: 31.3 pg (ref 26.6–33.0)
MCHC: 33.8 g/dL (ref 31.5–35.7)
MCV: 92 fL (ref 79–97)
Monocytes Absolute: 0.6 10*3/uL (ref 0.1–0.9)
Monocytes: 9 %
Neutrophils Absolute: 4.4 10*3/uL (ref 1.4–7.0)
Neutrophils: 65 %
Phosphorus: 3.6 mg/dL (ref 2.8–4.1)
Platelets: 165 10*3/uL (ref 150–450)
Potassium: 4.3 mmol/L (ref 3.5–5.2)
Prostate Specific Ag, Serum: 1.2 ng/mL (ref 0.0–4.0)
RBC: 4.48 x10E6/uL (ref 4.14–5.80)
RDW: 13.2 % (ref 11.6–15.4)
Sodium: 141 mmol/L (ref 134–144)
T3 Uptake Ratio: 28 % (ref 24–39)
T4, Total: 5.9 ug/dL (ref 4.5–12.0)
TSH: 1.71 u[IU]/mL (ref 0.450–4.500)
Total Protein: 5.9 g/dL — ABNORMAL LOW (ref 6.0–8.5)
Triglycerides: 135 mg/dL (ref 0–149)
Uric Acid: 8.3 mg/dL (ref 3.8–8.4)
VLDL Cholesterol Cal: 24 mg/dL (ref 5–40)
WBC: 6.7 10*3/uL (ref 3.4–10.8)
eGFR: 72 mL/min/{1.73_m2} (ref >59)

## 2022-03-23 LAB — HGB A1C W/O EAG: Hgb A1c MFr Bld: 5.4 % (ref 4.8–5.6)

## 2022-03-23 NOTE — Telephone Encounter (Signed)
This is a complex patient with ongoing workup for PVCs and HOCM. He was last seen by Dr. Mayford Knife who did not think his dyspnea and hypoxia were cardiac in nature.  ? ?He has an appt with Dr. Gala Romney 04/05/22. I will defer preop risk stratification to Dr. Gala Romney at that visit.  ? ?

## 2022-03-23 NOTE — Addendum Note (Signed)
Addended by: Albina Billet A on: 03/23/2022 12:33 PM ? ? Modules accepted: Orders, Level of Service ? ?

## 2022-03-23 NOTE — Telephone Encounter (Signed)
? ?  Pre-operative Risk Assessment  ?  ?Patient Name: Ryan Glass  ?DOB: 05/14/1956 ?MRN: [id number]  ? ? LOOKS LIKE PT MAY BE FOLLOWED BY 2 CARDIOLOGY OFFICES; LOOKS LIKE HE IS BEING SEEN AT [facility]; DR.   Campbell Riches ? ?Request for Surgical Clearance   ? ?Procedure:   LEFT KNEE ARTHROPLASTY ? ?Date of Surgery:  Clearance TBD                              ?   ?Surgeon:  DR. Frederik Pear ?Surgeon's Group or Practice Name:  [facility] ?Phone number:  [phone] ATTN: [place] ?Fax number:  [phone] ?  ?Type of Clearance Requested:   ?- Medical  ?- Pharmacy:  Hold Apixaban (Eliquis)   ?  ?Type of Anesthesia:  Spinal ?  ?Additional requests/questions:   ? ?Signed, ?Julaine Hua   ?03/23/2022, 11:22 AM  ? ?

## 2022-03-25 MED ORDER — CHOLECALCIFEROL 1.25 MG (50000 UT) PO TABS: 1.0000 | ORAL_TABLET | ORAL | 0 refills | Status: DC

## 2022-03-25 NOTE — Telephone Encounter (Signed)
Patient contacted via telephone stated had reviewed my chart.  Reviewed exec panel/Vitamin D/Hgab1c previous results compared to now.  Discussed with history of elevated BUN/Cr and low eGFR/anemia total protein low not unexpected and should correct with improved kidney function and diet/if following rehab dietitian advice.  Patient trying to increase green vegetables in diet along with others.  Discussed snow peas/unshelled garden peas, green beans, green bell peppers, cucumbers in addition to leafy vegetables. ? ?Patient confirmed [place] preferred pharmacy.  Electronic Rx cholecalciferol 50,000 units weekly #12 RF0 sent to his pharmacy of choice.  Repeat blood level in 3 months.  Goal 60 for blood level.  Exitcare handout on vitamin D deficiency sent to patient my chart. ? ?Patient stated his knee pain has worsened since increasing exercise for pulmonary and cardiac rehab. 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  Discussed with patient to stop in clinic again for repeat blood pressure check when knee pain improved.  Notified patient cholesterol and blood sugar passed for Be Well but blood pressure did not.  Patient verbalized understanding information/instructions, agreed with plan of care and had no further questions at this time. ?

## 2022-03-26 NOTE — Telephone Encounter (Signed)
I spoke with patient and made him aware the APP recommendations. He states his surgeon tomorrow and he wants to know why he needs to see Dr Gala Romney? He also wants to know why Dr Mayford Knife can not clear him for his procedure. He states he does not want to wait until his appointment with Dr Gala Romney and wants to know what can we do? I advised him that I would send a message to the provider and we would go from there. He voiced understanding.  ?

## 2022-03-26 NOTE — Telephone Encounter (Addendum)
Callback staff: Please let patient know I reviewed with Dr. Mayford Knife. Given that he has had significant issues with his oxygen saturation and concern about a potential condition called exercise-induced pulmonary hypertension, Dr. Mayford Knife will not clear him until he is seen by Dr. Gala Romney for evaluation of this. It is very important he keep followup because we want to ensure his heart and lungs are safe for surgery.  ? ?I added "preop" FYI to upcoming appt so Dr. Gala Romney is aware to address, will cc him here as well as FYI.  ? ?Will otherwise remove from preop APP box, separate third party input not needed at this time. ?

## 2022-03-26 NOTE — Telephone Encounter (Signed)
Patient with complex PMH. Last OV with Dr. [name] 01/2022. Has appt with Dr. Haroldine Laws 04/05/22. I agree with Dara Hoyer original recommendation that he should see CHF clinic first before clearing for knee surgery. However, per CMA note, "He states his surgeon tomorrow and he wants to know why he needs to see Dr Haroldine Laws? He also wants to know why Dr [name] can not clear him for his procedure. He states he does not want to wait until his appointment with Dr Haroldine Laws and wants to know what can we do?" ? ?Will route to Dr. [name] to get her input on whether she would prefer patient see Dr. Haroldine Laws first as well or if she feels comfortable providing a recommendation on pre-surgical clearance at this time. Patient has chronic dyspnea therefore unable to clear via traditional virtual visit algorithm. Dr. [name] - Please route response to P CV DIV PREOP (the pre-op pool). Thank you. ?

## 2022-03-27 ENCOUNTER — Encounter (HOSPITAL_COMMUNITY): Payer: No Typology Code available for payment source

## 2022-03-27 ENCOUNTER — Encounter (HOSPITAL_COMMUNITY)
Admission: RE | Admit: 2022-03-27 | Discharge: 2022-03-27 | Disposition: A | Discharge status: Home / Self Care | Payer: No Typology Code available for payment source | Source: Ambulatory Visit | Attending: Pulmonary Disease | Admitting: Pulmonary Disease

## 2022-03-27 DIAGNOSIS — Z01810 Encounter for preprocedural cardiovascular examination: Secondary | ICD-10-CM | POA: Diagnosis not present

## 2022-03-27 DIAGNOSIS — J9611 Chronic respiratory failure with hypoxia: Secondary | ICD-10-CM

## 2022-03-27 NOTE — Telephone Encounter (Signed)
I have left a message for the pt that Dr. Mayford Knife is not giving clearance at this moment. Dr. Mayford Knife will be awaiting input form Dr. Gala Romney appt 04/05/22 before making a final decision in surgery clearance. I will send FYI to surgeon office as well.  ?

## 2022-03-27 NOTE — Progress Notes (Signed)
Daily Session Note ? ?Patient Details  ?Name: Ryan Glass ?MRN: [id number] ?Date of Birth: [birth year] ?Referring Provider:   ?Flowsheet Row Pulmonary Rehab Walk Test from 02/09/2022 in [place]  ?Referring Provider Dewald  ? ?  ? ? ?Encounter Date: 03/27/2022 ? ?Check In: ? Session Check In - 03/27/22 1510   ? ?  ? Check-In  ? Supervising physician immediately available to respond to emergencies [facility] immediately available   ? Physician(s) Dr. Pietro Cassis   ? Location [facility]   ? Staff Present Rosebud Poles, RN, Quentin Ore, MS, ACSM-CEP, Exercise Physiologist;Lisa Ysidro Evert, RN;Portia Rollene Rotunda, RN, BSN   ? Virtual Visit No   ? Medication changes reported     No   ? Fall or balance concerns reported    No   ? Tobacco Cessation No Change   ? Warm-up and Cool-down Performed as group-led instruction   ? Resistance Training Performed Yes   ? VAD Patient? No   ? PAD/SET Patient? No   ?  ? Pain Assessment  ? Currently in Pain? No/denies   ? Multiple Pain Sites No   ? ?  ?  ? ?  ? ? ?Capillary Blood Glucose: ?No results found for this or any previous visit (from the past 24 hour(s)). ? ? Exercise Prescription Changes - 03/27/22 1500   ? ?  ? Response to Exercise  ? Blood Pressure (Admit) 134/82   ? Blood Pressure (Exercise) 120/70   ? Blood Pressure (Exit) 108/70   ? Heart Rate (Admit) 91 bpm   ? Heart Rate (Exercise) 108 bpm   ? Heart Rate (Exit) 92 bpm   ? Oxygen Saturation (Admit) 91 %   ? Oxygen Saturation (Exercise) 92 %   ? Oxygen Saturation (Exit) 91 %   ? Rating of Perceived Exertion (Exercise) 11   ? Perceived Dyspnea (Exercise) 2   ? Duration Continue with 30 min of aerobic exercise without signs/symptoms of physical distress.   ? Intensity THRR unchanged   ?  ? Progression  ? Progression Continue to progress workloads to maintain intensity without signs/symptoms of physical distress.   ?  ? Resistance Training  ? Training Prescription Yes   ? Weight blue  bands   ? Reps 10-15   ? Time 10 Minutes   ?  ? NuStep  ? Level 3   ? SPM 80   ? Minutes 15   ? METs 2   ?  ? Arm Ergometer  ? Level 2   ? Watts --   48 rpm  ? Minutes 15   ? ?  ?  ? ?  ? ? ?Social History  ? ?Tobacco Use  ?Smoking Status Never  ?Smokeless Tobacco Never  ? ? ?Goals Met:  ?Proper associated with RPD/PD & O2 Sat ?Exercise tolerated well ?No report of concerns or symptoms today ?Strength training completed today ? ?Goals Unmet:  ?Not Applicable ? ?Comments: Service time is from 1325 to 1430 ? ? ? ?Dr. Rodman Pickle is Medical Director for Pulmonary Rehab at [facility].  ?

## 2022-03-29 ENCOUNTER — Encounter (HOSPITAL_COMMUNITY): Payer: No Typology Code available for payment source

## 2022-03-29 ENCOUNTER — Encounter (HOSPITAL_COMMUNITY)
Admission: RE | Admit: 2022-03-29 | Discharge: 2022-03-29 | Disposition: A | Discharge status: Home / Self Care | Payer: No Typology Code available for payment source | Source: Ambulatory Visit | Attending: Pulmonary Disease | Admitting: Pulmonary Disease

## 2022-03-29 DIAGNOSIS — Z01810 Encounter for preprocedural cardiovascular examination: Secondary | ICD-10-CM | POA: Diagnosis not present

## 2022-03-29 DIAGNOSIS — J9611 Chronic respiratory failure with hypoxia: Secondary | ICD-10-CM

## 2022-03-29 NOTE — Progress Notes (Signed)
Daily Session Note ? ?Patient Details  ?Name: Ryan Glass ?MRN: [id number] ?Date of Birth: 15-Nov-1956 ?Referring Provider:   ?Flowsheet Row Pulmonary Rehab Walk Test from 02/09/2022 in [place]  ?Referring Provider Dewald  ? ?  ? ? ?Encounter Date: 03/29/2022 ? ?Check In: ? Session Check In - 03/29/22 1417   ? ?  ? Check-In  ? Supervising physician immediately available to respond to emergencies [facility] immediately available   ? Physician(s) Dr. Pietro Cassis   ? Location [facility]   ? Staff Present Rosebud Poles, RN, Quentin Ore, MS, ACSM-CEP, Exercise Physiologist;Elanah Osmanovic Ysidro Evert, RN   ? Virtual Visit No   ? Medication changes reported     No   ? Fall or balance concerns reported    No   ? Tobacco Cessation No Change   ? Warm-up and Cool-down Performed as group-led instruction   ? Resistance Training Performed Yes   ? VAD Patient? No   ? PAD/SET Patient? No   ?  ? Pain Assessment  ? Currently in Pain? No/denies   ? Multiple Pain Sites No   ? ?  ?  ? ?  ? ? ?Capillary Blood Glucose: ?No results found for this or any previous visit (from the past 24 hour(s)). ? ? ? ?Social History  ? ?Tobacco Use  ?Smoking Status Never  ?Smokeless Tobacco Never  ? ? ?Goals Met:  ?Proper associated with RPD/PD & O2 Sat ?Exercise tolerated well ?No report of concerns or symptoms today ?Strength training completed today ? ?Goals Unmet:  ?Not Applicable ? ?Comments: Service time is from 1320 to 1435 ? ? ? ?Dr. Rodman Pickle is Medical Director for Pulmonary Rehab at [facility].  ?

## 2022-03-29 NOTE — Progress Notes (Signed)
Home Exercise Prescription ?I have reviewed a Home Exercise Prescription with Nicole Kindred. Brodin is currently biking and using the elliptical for 2 non-rehab days/wk 30 min/day. I encouraged him to increase his frequency after he graduates the program. He agreed with my recommendations. Adran is motivated to exercise and improve his functional capacity. He has all the resources he need to exercise at home. I am confident in [place] carrying out an exercise regimen at home. The patient stated that their goals were to decrease weight to 210 lbs and decrease knee pain with exercise. We reviewed exercise guidelines, target heart rate during exercise, RPE Scale, weather conditions, endpoints for exercise, warmup and cool down. The patient is encouraged to come to me with any questions. I will continue to follow up with the patient to assist them with progression and safety.   ? ?Joya San, MS, ACSM-CEP ?03/29/2022 ?3:09 PM  ?

## 2022-04-03 ENCOUNTER — Encounter (HOSPITAL_COMMUNITY): Payer: No Typology Code available for payment source

## 2022-04-03 ENCOUNTER — Encounter (HOSPITAL_COMMUNITY)
Admission: RE | Admit: 2022-04-03 | Discharge: 2022-04-03 | Disposition: A | Discharge status: Home / Self Care | Payer: No Typology Code available for payment source | Source: Ambulatory Visit | Attending: Pulmonary Disease | Admitting: Pulmonary Disease

## 2022-04-03 DIAGNOSIS — J9611 Chronic respiratory failure with hypoxia: Secondary | ICD-10-CM

## 2022-04-03 DIAGNOSIS — Z01810 Encounter for preprocedural cardiovascular examination: Secondary | ICD-10-CM | POA: Diagnosis not present

## 2022-04-03 NOTE — Telephone Encounter (Signed)
Last visit patient had ongoing symptoms.  He was was to follow-up in 4 to 6 weeks.  We will need to set up a follow-up appointment with his MD - Dr Francine Graven .  ?Need to make sure he is medically stable ?

## 2022-04-03 NOTE — Progress Notes (Signed)
Daily Session Note ? ?Patient Details  ?Name: Ryan Glass ?MRN: [id number] ?Date of Birth: [birth year] ?Referring Provider:   ?Flowsheet Row Pulmonary Rehab Walk Test from 02/09/2022 in [place]  ?Referring Provider Dewald  ? ?  ? ? ?Encounter Date: 04/03/2022 ? ?Check In: ? Session Check In - 04/03/22 1416   ? ?  ? Check-In  ? Supervising physician immediately available to respond to emergencies [facility] immediately available   ? Physician(s) Dr. Pietro Cassis   ? Location [facility]   ? Staff Present Rosebud Poles, RN, Milus Glazier, MS, ACSM-CEP, CCRP, Exercise Physiologist;Kaylee Rosana Hoes, MS, ACSM-CEP, Exercise Physiologist;Donica Derouin Ysidro Evert, RN   ? Virtual Visit No   ? Medication changes reported     No   ? Fall or balance concerns reported    No   ? Tobacco Cessation No Change   ? Warm-up and Cool-down Performed as group-led instruction   ? Resistance Training Performed Yes   ? VAD Patient? No   ? PAD/SET Patient? No   ?  ? Pain Assessment  ? Currently in Pain? No/denies   ? Multiple Pain Sites No   ? ?  ?  ? ?  ? ? ?Capillary Blood Glucose: ?No results found for this or any previous visit (from the past 24 hour(s)). ? ? ? ?Social History  ? ?Tobacco Use  ?Smoking Status Never  ?Smokeless Tobacco Never  ? ? ?Goals Met:  ?Proper associated with RPD/PD & O2 Sat ?Exercise tolerated well ?No report of concerns or symptoms today ?Strength training completed today ? ?Goals Unmet:  ?Not Applicable ? ?Comments: Service time is from 1520 to 1435 ? ? ? ?Dr. Rodman Pickle is Medical Director for Pulmonary Rehab at [facility].  ?

## 2022-04-03 NOTE — Telephone Encounter (Signed)
Fax received from Dr. Gean BirchwoodFrank Rowan with [facility] to perform a Left knee arthroplasty on patient.  Patient needs surgery clearance. Patient was seen on 12/22/2021. Office protocol is a risk assessment can be sent to surgeon if patient has been seen in 60 days or less.  ? ?[facility] to [facility] for risk assessment or recommendations if patient needs to be seen in office prior to surgical procedure.   ?

## 2022-04-03 NOTE — Telephone Encounter (Signed)
Patient seen in workcenter congestion resolved.  Awaiting clearance from congestive heart failure clinic and pulmonology for knee replacement.  Patient saw PCM last week 03/26/22 for pre-op clearance.  Last rehab appt for cardiac/pulmonary next week per patient and he will continue elliptical and bike at home.  Demonstrated heel slides and straight leg raises to patient for quadriceps strengthening/flexibility.  Discussed with patient will check magnesium level with vitamin D level in 3 months.   Noted RBC and hgb slightly low on PCM labs and creatinine slightly increased.  Patient stated he is still taking iron, increasing protein in diet along with fruits and vegetables.  Lasix use more frequently most likely cause of bump in creatinine up a tenth and GFR decreased to 50.  Patient reported Dr Mayford Knife cardiology not signing off for surgery until he sees CHF provider.   Patient reported took second vitamin D dose yesterday tolerating without difficulty.  Gait sure and steady in warehouse.  A&Ox3 spoke full sentences without difficulty  skin warm dry and pink.  No congestion/cough/shortness of breath noted.  Patient reported he feels respiratory effort has decreased with therapy and he is getting better airflow.  Knees still causing his discomfort and wants to proceed with surgery as soon as possible.  Patient verbalized understanding information/instructions, agreed with plan of care and had no further questions at this time. ?

## 2022-04-03 NOTE — Telephone Encounter (Signed)
Next Appt ?With Pulmonology Ryan Bayley, NP) ?04/05/2022 at 11:30 AM ?

## 2022-04-03 NOTE — Telephone Encounter (Signed)
Tried calling the pt to schedule appt with Dr Francine Graven. There was no answer- LMTCB and will send back to the surgery clearance pool.  ?

## 2022-04-04 NOTE — Progress Notes (Signed)
Pulmonary Individual Treatment Plan ? ?Patient Details  ?Name: Ryan Glass ?MRN: [id number] ?Date of Birth: 01-15-1956 ?Referring Provider:   ?Flowsheet Row Pulmonary Rehab Walk Test from 02/09/2022 in [place]  ?Referring Provider Dewald  ? ?  ? ? ?Initial Encounter Date:  ?Flowsheet Row Pulmonary Rehab Walk Test from 02/09/2022 in [place]  ?Date 02/09/22  ? ?  ? ? ?Visit Diagnosis: Chronic respiratory failure with hypoxia (HCC) ? ?Patient's Home Medications on Admission:  ? ?Current Outpatient Medications:  ?  Abatacept (ORENCIA CLICKJECT) 0000000 MG/ML SOAJ, Inject into the skin once a week., Disp: , Rfl:  ?  Acetaminophen-Codeine 300-30 MG tablet, Take 1-2 tablets by mouth every 12 (twelve) hours as needed for pain., Disp: , Rfl:  ?  albuterol (VENTOLIN HFA) 108 (90 Base) MCG/ACT inhaler, Inhale 2 puffs into the lungs every 4 (four) hours as needed for wheezing or shortness of breath., Disp: 8 g, Rfl: 2 ?  apixaban (ELIQUIS) 5 MG TABS tablet, Take 1 tablet by mouth 2 (two) times daily., Disp: , Rfl:  ?  atorvastatin (LIPITOR) 40 MG tablet, Take 1 tablet (40 mg total) by mouth daily., Disp: 90 tablet, Rfl: 0 ?  Cholecalciferol 1.25 MG (50000 UT) TABS, Take 1 tablet by mouth once a week., Disp: 12 tablet, Rfl: 0 ?  colchicine 0.6 MG tablet, Take 1-2 tablets by mouth daily., Disp: , Rfl:  ?  diclofenac Sodium (VOLTAREN) 1 % GEL, Apply topically., Disp: , Rfl:  ?  fluticasone (FLONASE) 50 MCG/ACT nasal spray, Place 1 spray into both nostrils daily., Disp: , Rfl:  ?  fluticasone-salmeterol (ADVAIR) 250-50 MCG/ACT AEPB, , Disp: , Rfl:  ?  folic acid (FOLVITE) 1 MG tablet, Take 1 mg by mouth daily., Disp: , Rfl:  ?  furosemide (LASIX) 40 MG tablet, Take 1 tablet (40 mg total) by mouth daily as needed (weight gain). Take 1 tablet by mouth daily for 3 days, then decrease to only as needed for increase weight gain, Disp: 30 tablet, Rfl: 3 ?  gabapentin  (NEURONTIN) 600 MG tablet, Take 600 mg by mouth with breakfast, with lunch, and with evening meal., Disp: , Rfl:  ?  hydrALAZINE (APRESOLINE) 100 MG tablet, Take 100 mg by mouth 3 (three) times daily., Disp: , Rfl:  ?  loratadine (CLARITIN) 10 MG tablet, Take 10 mg by mouth daily., Disp: , Rfl:  ?  Magnesium Oxide 400 MG CAPS, Take 1 capsule (400 mg total) by mouth every other day. While taking daily multivitamin with 100mg  magnesium every day, Disp: 90 capsule, Rfl: 3 ?  montelukast (SINGULAIR) 10 MG tablet, Take 1 tablet (10 mg total) by mouth at bedtime., Disp: 90 tablet, Rfl: 3 ?  nebivolol (BYSTOLIC) 5 MG tablet, Take 1 tablet (5 mg total) by mouth daily., Disp: 90 tablet, Rfl: 3 ?  pantoprazole (PROTONIX) 40 MG tablet, Take 1 tablet (40 mg total) by mouth 2 (two) times daily., Disp: 60 tablet, Rfl: 1 ?  potassium chloride SA (KLOR-CON M) 20 MEQ tablet, Take 1 tablet by  mouth only when you have to use a Lasix, Disp: 90 tablet, Rfl: 3 ?  sildenafil (REVATIO) 20 MG tablet, Take 20 mg by mouth daily as needed (erectile dysfunction)., Disp: , Rfl:  ?  sotalol (BETAPACE) 120 MG tablet, Take 1 tablet (120 mg total) by mouth every 12 (twelve) hours., Disp: 60 tablet, Rfl: 6 ?  spironolactone (ALDACTONE) 25 MG tablet, Take 1  tablet (25 mg total) by mouth daily., Disp: 30 tablet, Rfl: 6 ? ?Past Medical History: ?Past Medical History:  ?Diagnosis Date  ? Ascending aortic aneurysm   ? a. CT 05/2021 showed 4.4x4.4cm TAA. Stable on chest CTA 12/2021 at 4.4cm.  ? Bilateral lower extremity edema   ? Celiac artery dilatation (HCC)   ? 1.8cm at the celiac trunk on Chest CT 12/2021  ? Chronic gout without tophus   ? followed by dr Gerilyn Nestle    (06-08-2020  per pt last episode right knee 3 wks ago)  ? CKD (chronic kidney disease), stage II   ? nephrology--- Jen Mow PA (10-29-2019 note in epic scanned in  media)  ? Heart failure with mid-range ejection fraction ([place]) 05/27/2021  ? 05/26/21: Left ventricular ejection  fraction, by estimation, is 45 to 50%. There is severe asymmetric left ventricular hypertrophy. G1DD present.   ? History of COVID-19 06/07/2021  ? HOCM (hypertrophic obstructive cardiomyopathy) ([place])   ? s/p ICD 07/2021  ? Hypertension   ? followed by cardiology, dr t. turner   (05-14-2018 nuclear study in epic , normal perfusion with nuclear ef 61%)  ? Left hydrocele   ? Low serum potassium level 04/27/2014  ? OSA on CPAP   ? per pt uses every night  ? Palpitations followed by dr t. turner  ? 06-08-2020  still feels palipations due to PVCs when exertion but not with chest pain/ discomfort  ? Pneumonia due to COVID-19 virus   ? PVC's (premature ventricular contractions) cardiologist--- dr t. turner  ? Status post PVC ablation by [name] 2019 with recurrence of frequent PVCs/bigeminy;  prior pseudobradycardia r/t pvcs  ? Rheumatoid arthritis involving multiple sites [facility])   ? rheumotology--- dr a. Gerilyn Nestle  ([facility] in [place])  ? ? ?Tobacco Use: ?Social History  ? ?Tobacco Use  ?Smoking Status Never  ?Smokeless Tobacco Never  ? ? ?Labs: ?Review Flowsheet   ? ?  ?  Latest Ref Rng & Units 02/20/2021 05/27/2021 05/29/2021 10/07/2021  ?Labs for ITP Cardiac and Pulmonary Rehab  ?Cholestrol 100 - 199 mg/dL 136   123      ?LDL (calc) 0 - 99 mg/dL 60   61      ?HDL-C >39 mg/dL 64   49      ?Trlycerides 0 - 149 mg/dL 55   67      ?Hemoglobin A1c 4.8 - 5.6 % 5.3       ?PH, Arterial 7.350 - 7.450   7.430     ?PCO2 arterial 32.0 - 48.0 mmHg   33.4     ?Bicarbonate 20.0 - 28.0 mmol/L   22.2    ? 18.3   19.5    ? 19.1    ?TCO2 22 - 32 mmol/L   23    ? 19   19    ? 20    ?Acid-base deficit 0.0 - 2.0 mmol/L   2.0    ? 6.0   4.3    ? 4.0    ?O2 Saturation %   99.0    ? 71.0   82.3    ? 90.0    ? ?  03/22/2022  ?Labs for ITP Cardiac and Pulmonary Rehab  ?Cholestrol 144    ?LDL (calc) 71    ?HDL-C 49    ?Trlycerides 135    ?Hemoglobin A1c 5.4    ?PH, Arterial   ?PCO2 arterial   ?Bicarbonate   ?TCO2   ?Acid-base deficit   ?  O2 Saturation   ?   ? ? Multiple values from one day are sorted in reverse-chronological order  ?  ?  ? ? ?Capillary Blood Glucose: ?No results found for: GLUCAP ? ? ?Pulmonary Assessment Scores: ? Pulmonary Assessment Scores   ? ? [place] Name 02/09/22 1109  ?  ?  ?  ? ADL UCSD  ? ADL Phase Entry    ? SOB Score total 74    ?  ? CAT Score  ? CAT Score 17    ?  ? mMRC Score  ? mMRC Score 4    ? ?  ?  ? ?  ? ?UCSD: ?Self-administered rating of dyspnea associated with activities of daily living (ADLs) ?6-point scale (0 = "not at all" to 5 = "maximal or unable to do because of breathlessness")  ?Scoring Scores range from 0 to 120.  Minimally important difference is 5 units ? ?CAT: ?CAT can identify the health impairment of COPD patients and is better correlated with disease progression.  ?CAT has a scoring range of zero to 40. The CAT score is classified into four groups of low (less than 10), medium (10 - 20), high (21-30) and very high (31-40) based on the impact level of disease on health status. A CAT score over 10 suggests significant symptoms.  A worsening CAT score could be explained by an exacerbation, poor medication adherence, poor inhaler technique, or progression of COPD or comorbid conditions.  ?CAT MCID is 2 points ? ?mMRC: ?mMRC (Modified Medical Research Council) Dyspnea Scale is used to assess the degree of baseline functional disability in patients of respiratory disease due to dyspnea. ?No minimal important difference is established. A decrease in score of 1 point or greater is considered a positive change.  ? ?Pulmonary Function Assessment: ? Pulmonary Function Assessment - 02/09/22 1112   ? ?  ? Breath  ? Bilateral Breath Sounds Clear   ? Shortness of Breath Yes;Limiting activity;Fear of Shortness of Breath   ? ?  ?  ? ?  ? ? ?Exercise Target Goals: ?Exercise Program Goal: ?Individual exercise prescription set using results from initial 6 min walk test and THRR while considering  patient?s activity barriers and safety.   ? ?Exercise Prescription Goal: ?Initial exercise prescription builds to 30-45 minutes a day of aerobic activity, 2-3 days per week.  Home exercise guidelines will be given to patient during program as part of exercise

## 2022-04-04 NOTE — Telephone Encounter (Signed)
See tcon dated 03/23/22 ?

## 2022-04-05 ENCOUNTER — Encounter (HOSPITAL_COMMUNITY): Payer: No Typology Code available for payment source

## 2022-04-05 ENCOUNTER — Ambulatory Visit (INDEPENDENT_AMBULATORY_CARE_PROVIDER_SITE_OTHER): Payer: No Typology Code available for payment source | Admitting: Primary Care

## 2022-04-05 ENCOUNTER — Encounter: Payer: Self-pay | Admitting: Primary Care

## 2022-04-05 ENCOUNTER — Encounter (HOSPITAL_COMMUNITY)
Admission: RE | Admit: 2022-04-05 | Discharge: 2022-04-05 | Disposition: A | Discharge status: Home / Self Care | Payer: No Typology Code available for payment source | Source: Ambulatory Visit | Attending: Pulmonary Disease | Admitting: Pulmonary Disease

## 2022-04-05 ENCOUNTER — Encounter (HOSPITAL_COMMUNITY): Payer: Self-pay | Admitting: Internal Medicine

## 2022-04-05 ENCOUNTER — Ambulatory Visit (HOSPITAL_BASED_OUTPATIENT_CLINIC_OR_DEPARTMENT_OTHER)
Admission: RE | Admit: 2022-04-05 | Discharge: 2022-04-05 | Disposition: A | Discharge status: Home / Self Care | Payer: No Typology Code available for payment source | Source: Ambulatory Visit | Attending: Internal Medicine | Admitting: Internal Medicine

## 2022-04-05 VITALS — BP 158/88 | HR 89 | Wt 246.8 lb

## 2022-04-05 DIAGNOSIS — I4510 Unspecified right bundle-branch block: Secondary | ICD-10-CM | POA: Insufficient documentation

## 2022-04-05 DIAGNOSIS — J4530 Mild persistent asthma, uncomplicated: Secondary | ICD-10-CM | POA: Diagnosis not present

## 2022-04-05 DIAGNOSIS — M25562 Pain in left knee: Secondary | ICD-10-CM | POA: Insufficient documentation

## 2022-04-05 DIAGNOSIS — Z86711 Personal history of pulmonary embolism: Secondary | ICD-10-CM | POA: Insufficient documentation

## 2022-04-05 DIAGNOSIS — Z7182 Exercise counseling: Secondary | ICD-10-CM | POA: Insufficient documentation

## 2022-04-05 DIAGNOSIS — Z79899 Other long term (current) drug therapy: Secondary | ICD-10-CM | POA: Insufficient documentation

## 2022-04-05 DIAGNOSIS — I2510 Atherosclerotic heart disease of native coronary artery without angina pectoris: Secondary | ICD-10-CM | POA: Insufficient documentation

## 2022-04-05 DIAGNOSIS — Z791 Long term (current) use of non-steroidal anti-inflammatories (NSAID): Secondary | ICD-10-CM | POA: Insufficient documentation

## 2022-04-05 DIAGNOSIS — I493 Ventricular premature depolarization: Secondary | ICD-10-CM | POA: Insufficient documentation

## 2022-04-05 DIAGNOSIS — M2540 Effusion, unspecified joint: Secondary | ICD-10-CM | POA: Insufficient documentation

## 2022-04-05 DIAGNOSIS — I5042 Chronic combined systolic (congestive) and diastolic (congestive) heart failure: Secondary | ICD-10-CM | POA: Insufficient documentation

## 2022-04-05 DIAGNOSIS — J9611 Chronic respiratory failure with hypoxia: Secondary | ICD-10-CM

## 2022-04-05 DIAGNOSIS — I480 Paroxysmal atrial fibrillation: Secondary | ICD-10-CM | POA: Insufficient documentation

## 2022-04-05 DIAGNOSIS — Z7952 Long term (current) use of systemic steroids: Secondary | ICD-10-CM | POA: Insufficient documentation

## 2022-04-05 DIAGNOSIS — N182 Chronic kidney disease, stage 2 (mild): Secondary | ICD-10-CM | POA: Insufficient documentation

## 2022-04-05 DIAGNOSIS — G4733 Obstructive sleep apnea (adult) (pediatric): Secondary | ICD-10-CM | POA: Diagnosis not present

## 2022-04-05 DIAGNOSIS — R000 Tachycardia, unspecified: Secondary | ICD-10-CM | POA: Insufficient documentation

## 2022-04-05 DIAGNOSIS — I2729 Other secondary pulmonary hypertension: Secondary | ICD-10-CM | POA: Insufficient documentation

## 2022-04-05 DIAGNOSIS — Z7901 Long term (current) use of anticoagulants: Secondary | ICD-10-CM | POA: Insufficient documentation

## 2022-04-05 DIAGNOSIS — M1712 Unilateral primary osteoarthritis, left knee: Secondary | ICD-10-CM | POA: Insufficient documentation

## 2022-04-05 DIAGNOSIS — Z8249 Family history of ischemic heart disease and other diseases of the circulatory system: Secondary | ICD-10-CM | POA: Insufficient documentation

## 2022-04-05 DIAGNOSIS — R9439 Abnormal result of other cardiovascular function study: Secondary | ICD-10-CM | POA: Insufficient documentation

## 2022-04-05 DIAGNOSIS — M069 Rheumatoid arthritis, unspecified: Secondary | ICD-10-CM | POA: Insufficient documentation

## 2022-04-05 DIAGNOSIS — Q2112 Patent foramen ovale: Secondary | ICD-10-CM | POA: Insufficient documentation

## 2022-04-05 DIAGNOSIS — I130 Hypertensive heart and chronic kidney disease with heart failure and stage 1 through stage 4 chronic kidney disease, or unspecified chronic kidney disease: Secondary | ICD-10-CM | POA: Insufficient documentation

## 2022-04-05 DIAGNOSIS — Z95810 Presence of automatic (implantable) cardiac defibrillator: Secondary | ICD-10-CM | POA: Insufficient documentation

## 2022-04-05 DIAGNOSIS — M2550 Pain in unspecified joint: Secondary | ICD-10-CM | POA: Insufficient documentation

## 2022-04-05 DIAGNOSIS — R9431 Abnormal electrocardiogram [ECG] [EKG]: Secondary | ICD-10-CM | POA: Insufficient documentation

## 2022-04-05 DIAGNOSIS — Z01810 Encounter for preprocedural cardiovascular examination: Secondary | ICD-10-CM

## 2022-04-05 DIAGNOSIS — R0609 Other forms of dyspnea: Secondary | ICD-10-CM | POA: Insufficient documentation

## 2022-04-05 DIAGNOSIS — E669 Obesity, unspecified: Secondary | ICD-10-CM | POA: Insufficient documentation

## 2022-04-05 DIAGNOSIS — I421 Obstructive hypertrophic cardiomyopathy: Secondary | ICD-10-CM | POA: Insufficient documentation

## 2022-04-05 DIAGNOSIS — I50812 Chronic right heart failure: Secondary | ICD-10-CM | POA: Insufficient documentation

## 2022-04-05 DIAGNOSIS — I5082 Biventricular heart failure: Secondary | ICD-10-CM | POA: Insufficient documentation

## 2022-04-05 DIAGNOSIS — Z01811 Encounter for preprocedural respiratory examination: Secondary | ICD-10-CM | POA: Diagnosis not present

## 2022-04-05 NOTE — Assessment & Plan Note (Addendum)
-   Patient is low-intermediate risk for prolonged mechanical ventilation and/or post op pulmonary complications from surgery d/t history of sleep apnea and reactive airways disease. Pulmonary function testing was normal in August 2022 and CXR was normal on 02/07/22. He is optimized for surgery from pulmonary standpoint, ultimate clearance will be decided by surgeon.  ? ?Recommend ?1. Short duration of surgery as much as possible and avoid paralytic if possible ?2. Recovery in step down or ICU with Pulmonary consultation if needed ?3. DVT prophylaxis ?4. Aggressive pulmonary toilet with o2, bronchodilatation, and incentive spirometry and early ambulation ? ? ? ?

## 2022-04-05 NOTE — Patient Instructions (Signed)
Thank you for your visit today. ? ?Dr. Leory Plowman will forward your visit note to your Orthopedic surgeon clearing you for surgery. ? ?Please follow up with Dr. Mayford Knife as no follow up is required with the Heart Failure Team. ? ?If you have any questions or concerns before your next appointment please send [place] a message through [place] or call our office at [phone].   ? ?TO LEAVE A MESSAGE FOR THE NURSE SELECT OPTION 2, PLEASE LEAVE A MESSAGE INCLUDING: ?YOUR NAME ?DATE OF BIRTH ?CALL BACK NUMBER ?REASON FOR CALL**this is important as we prioritize the call backs ? ?YOU WILL RECEIVE A CALL BACK THE SAME DAY AS LONG AS YOU CALL BEFORE 4:00 PM ? ?At the [facility], you and your health needs are our priority. As part of our continuing mission to provide you with exceptional heart care, we have created designated Provider Care Teams. These Care Teams include your primary Cardiologist (physician) and Advanced Practice Providers (APPs- Physician Assistants and Nurse Practitioners) who all work together to provide you with the care you need, when you need it.  ? ?You may see any of the following providers on your designated Care Team at your next follow up: ?Dr Arvilla Meres ?Dr Marca Ancona ?Tonye Becket, NP ?Robbie Lis, PA ?Jessica [place]ford,NP ?Anna Genre, PA ?Karle Plumber, PharmD ? ? ?Please be sure to bring in all your medications bottles to every appointment.  ? ?

## 2022-04-05 NOTE — Assessment & Plan Note (Signed)
-   Stable: No acute respiratory symptoms or recent exacerbations. Rare SABA use. Continue Advair 250-53mcg one puff twice daily and prn albuterol hfa q 6 hours ?

## 2022-04-05 NOTE — Assessment & Plan Note (Signed)
Stable ?Continue CPAP at bedtime ?

## 2022-04-05 NOTE — Patient Instructions (Addendum)
You are low-intermediate risk for prolonged mechanical ventilation and/or post op pulmonary complications from surgery d/t history of sleep apnea and reactive airways disease. Pulmonary function testing was normal in August 2022 and CXR was normal on 02/07/22. You are optimized for surgery from pulmonary standpoint, ultimate clearance will be decided by your surgeon.  ? ?Recommend you continue to wear CPAP every night and use Advair as directed twice daily. After surgery ambulate as soon as you are allowed, wear compression stockings and use incentive spirometer every hour while awake ? ?Orders: ?Ambulatory walk test today on room air  ? ?Follow-up: ?3 months with Dr. Francine Graven  ?

## 2022-04-05 NOTE — Progress Notes (Signed)
? ?@Patient  ID: Ryan Glass, male    DOB: [birth year], 66 y.o.   MRN: [id number] ? ?Chief Complaint  ?Patient presents with  ? Follow-up  ?  Surgical Clearance, sob-same  ? ? ?Referring provider: ?[name], MD ? ?HPI: ?66 year old male, never smoked. PMH significant for HTN, combined heart failure, rheumatoid arthritis, OSA on CPAP, pulmonary embolism in June [id number], diastolic heart failure. Patient of Dr. Erin Fulling.   ? ?Previous [facility] pulmonary encounter: ?10/13/2021- Interim hx  ?Patient presents today for hospital follow-up. He was admitted from 10/06/21-10/12/21 for acute respiratory failure with hypoxia, CAP and upper GIB. CXR on 10/07/21 showed low volume chest with indistinct density at the bases. Repeat chest imaging on 10/09/21 showed interval improvement in aeration of both lower lung fields suggesting resolving atelectasis/pneumonia. No new infiltrates of signs of pulmonary edema. He was discharged on Omnicef for total 7 days. Pulmonary was not consulted.  ? ?Patient is accompanied by his wife today. He does not feel well. He still has a cough and gets out of beath. They report getting low oxygen levels on pulse oximeter at home and feel he was discharged too early.  ? ? ?12/22/2021 Follow up : Asthma, OSA , PE  ?Patient returns for 3 month follow up . He is followed for suspected Mild asthma . Currently on  ?On Trelegy daily. He was recently changed from Advair to Trelegy unsure if helping with dyspnea . Complains of ongoing dyspnea with activity , decreased activity tolerance. Says at home O2 sats drop at home . Patient complains that his breathing is not getting much better.  He continues to have shortness of breath with activities. ?Has had an extensive work-up with cardiology. ?Recently on antibiotics for bronchitis.  Cough and congestion are improved.  Patient's pulse oximeter at home shows that his oxygen levels drop in 80s.  However today patient was walked in the office with O2 saturations  remaining 97% with ambulation on room air.  Heart rate was at 80 bpm.  As patient sat down O2 saturations did drop to 92 to 93% but never desatted below 90% .  ?Patient wears CPAP every single night.  CPAP download was requested.  Patient is unsure who his DME company is.  ?Recent CT chest 12/13/21 showed stable TAA, clear lungs  ? ?04/05/2022- Interim hx  ?Patient presents today for surgical clearance. Followed by our office for mild intermittent asthma and OSA. He is planning for left knee replacement with Dr. Mayer Camel, date has not been scheduled.   ? ?His breathing is stable, he has no acute respiratory symptoms. He rarely gets out of breath. He is maintained on Advair 250-79mcg one puff twice daily. He has not needed to use Albuterol rescue inhaler. He had normal pulmonary function testing in August 2022. Oxygen today was 98% on room air. He is compliant with CPAP most nights, he will occasionally fall asleep before putting it on. Denies sob, chest tightness, wheezing or cough.  ? ?Pulmonary function testing: ?08/01/21 PFTs >> FVC 3.65 (82%), FEV1 2.99 (90%), ratio 82, TLC 102, DLCOcor 25.93 (99%) ? ?Imaging: ?02/07/22 CXR >> Left AICD remains in place, unchanged. Heart is upper limits normal in size. Lungs clear. No effusions or acute bony abnormality  ? ?Allergies  ?Allergen Reactions  ? Amlodipine Besylate Swelling and Other (See Comments)  ?  Leg swelling with 10 mg daily am dosing; decreased with BID 5 mg dosing  ? Aspirin Itching  ? ? ?Immunization History  ?  Administered Date(s) Administered  ? Influenza,inj,Quad PF,6+ Mos 09/09/2014, 09/10/2015, 10/09/2016, 10/01/2017, 09/26/2018, 09/22/2019, 09/09/2020  ? Influenza,inj,quad, With Preservative 09/09/2014, 09/10/2015  ? Influenza-Unspecified 09/09/2014, 09/10/2015, 10/09/2016, 10/01/2017  ? Moderna SARS-COV2 Booster Vaccination 12/27/2020  ? PFIZER(Purple Top)SARS-COV-2 Vaccination 02/18/2020, 03/10/2020, 07/25/2020  ? Tdap 09/07/2008, 09/23/2017  ? Zoster  Recombinat (Shingrix) 02/05/2018, 06/30/2018  ? ? ?Past Medical History:  ?Diagnosis Date  ? Ascending aortic aneurysm ([place])   ? a. CT 05/2021 showed 4.4x4.4cm TAA. Stable on chest CTA 12/2021 at 4.4cm.  ? Bilateral lower extremity edema   ? Celiac artery dilatation (HCC)   ? 1.8cm at the celiac trunk on Chest CT 12/2021  ? Chronic gout without tophus   ? followed by dr Gerilyn Nestle    (06-08-2020  per pt last episode right knee 3 wks ago)  ? CKD (chronic kidney disease), stage II   ? nephrology--- Jen Mow PA (10-29-2019 note in epic scanned in  media)  ? Heart failure with mid-range ejection fraction ([place]) 05/27/2021  ? 05/26/21: Left ventricular ejection fraction, by estimation, is 45 to 50%. There is severe asymmetric left ventricular hypertrophy. G1DD present.   ? History of COVID-19 06/07/2021  ? HOCM (hypertrophic obstructive cardiomyopathy) ([place])   ? s/p ICD 07/2021  ? Hypertension   ? followed by cardiology, dr t. turner   (05-14-2018 nuclear study in epic , normal perfusion with nuclear ef 61%)  ? Left hydrocele   ? Low serum potassium level 04/27/2014  ? OSA on CPAP   ? per pt uses every night  ? Palpitations followed by dr t. turner  ? 06-08-2020  still feels palipations due to PVCs when exertion but not with chest pain/ discomfort  ? Pneumonia due to COVID-19 virus   ? PVC's (premature ventricular contractions) cardiologist--- dr t. turner  ? Status post PVC ablation by [name] 2019 with recurrence of frequent PVCs/bigeminy;  prior pseudobradycardia r/t pvcs  ? Rheumatoid arthritis involving multiple sites [facility])   ? rheumotology--- dr a. Gerilyn Nestle  ([facility] in [place])  ? ? ?Tobacco History: ?Social History  ? ?Tobacco Use  ?Smoking Status Never  ?Smokeless Tobacco Never  ? ?Counseling given: Not Answered ? ? ?Outpatient Medications Prior to Visit  ?Medication Sig Dispense Refill  ? Abatacept (ORENCIA CLICKJECT) 0000000 MG/ML SOAJ Inject into the skin once a week.    ? Acetaminophen-Codeine 300-30 MG tablet  Take 1-2 tablets by mouth every 12 (twelve) hours as needed for pain.    ? albuterol (VENTOLIN HFA) 108 (90 Base) MCG/ACT inhaler Inhale 2 puffs into the lungs every 4 (four) hours as needed for wheezing or shortness of breath. 8 g 2  ? apixaban (ELIQUIS) 5 MG TABS tablet Take 1 tablet by mouth 2 (two) times daily.    ? atorvastatin (LIPITOR) 40 MG tablet Take 1 tablet (40 mg total) by mouth daily. 90 tablet 0  ? Cholecalciferol 1.25 MG (50000 UT) TABS Take 1 tablet by mouth once a week. 12 tablet 0  ? colchicine 0.6 MG tablet Take 1-2 tablets by mouth daily.    ? diclofenac Sodium (VOLTAREN) 1 % GEL Apply topically.    ? fluticasone (FLONASE) 50 MCG/ACT nasal spray Place 1 spray into both nostrils daily.    ? fluticasone-salmeterol (ADVAIR) 250-50 MCG/ACT AEPB     ? folic acid (FOLVITE) 1 MG tablet Take 1 mg by mouth daily.    ? furosemide (LASIX) 40 MG tablet Take 1 tablet (40 mg total) by mouth daily as needed (  weight gain). Take 1 tablet by mouth daily for 3 days, then decrease to only as needed for increase weight gain 30 tablet 3  ? gabapentin (NEURONTIN) 600 MG tablet Take 600 mg by mouth with breakfast, with lunch, and with evening meal.    ? hydrALAZINE (APRESOLINE) 100 MG tablet Take 100 mg by mouth 3 (three) times daily.    ? loratadine (CLARITIN) 10 MG tablet Take 10 mg by mouth daily.    ? Magnesium Oxide 400 MG CAPS Take 1 capsule (400 mg total) by mouth every other day. While taking daily multivitamin with 100mg  magnesium every day 90 capsule 3  ? montelukast (SINGULAIR) 10 MG tablet Take 1 tablet (10 mg total) by mouth at bedtime. 90 tablet 3  ? nebivolol (BYSTOLIC) 5 MG tablet Take 1 tablet (5 mg total) by mouth daily. 90 tablet 3  ? pantoprazole (PROTONIX) 40 MG tablet Take 1 tablet (40 mg total) by mouth 2 (two) times daily. 60 tablet 1  ? potassium chloride SA (KLOR-CON M) 20 MEQ tablet Take 1 tablet by  mouth only when you have to use a Lasix 90 tablet 3  ? sildenafil (REVATIO) 20 MG tablet Take  20 mg by mouth daily as needed (erectile dysfunction).    ? sotalol (BETAPACE) 120 MG tablet Take 1 tablet (120 mg total) by mouth every 12 (twelve) hours. 60 tablet 6  ? spironolactone (ALDACTONE) 25 MG tablet Take

## 2022-04-05 NOTE — Progress Notes (Signed)
Daily Session Note ? ?Patient Details  ?Name: Ryan Glass ?MRN: [id number] ?Date of Birth: 10-May-1956 ?Referring Provider:   ?Flowsheet Row Pulmonary Rehab Walk Test from 02/09/2022 in [place]  ?Referring Provider Dewald  ? ?  ? ? ?Encounter Date: 04/05/2022 ? ?Check In: ? Session Check In - 04/05/22 1420   ? ?  ? Check-In  ? Supervising physician immediately available to respond to emergencies [facility] immediately available   ? Physician(s) Dr, Bonner Puna   ? Location [facility]   ? Staff Present Rodney Langton, Cathleen Fears, MS, ACSM-CEP, Exercise Physiologist;David Makemson, MS, ACSM-CEP, CCRP, Exercise Physiologist   ? Virtual Visit No   ? Medication changes reported     No   ? Fall or balance concerns reported    No   ? Tobacco Cessation No Change   ? Warm-up and Cool-down Performed as group-led instruction   ? Resistance Training Performed Yes   ? VAD Patient? No   ? PAD/SET Patient? No   ?  ? Pain Assessment  ? Currently in Pain? No/denies   ? Multiple Pain Sites No   ? ?  ?  ? ?  ? ? ?Capillary Blood Glucose: ?No results found for this or any previous visit (from the past 24 hour(s)). ? ? ? ?Social History  ? ?Tobacco Use  ?Smoking Status Never  ?Smokeless Tobacco Never  ? ? ?Goals Met:  ?Proper associated with RPD/PD & O2 Sat ?Exercise tolerated well ?No report of concerns or symptoms today ?Strength training completed today ? ?Goals Unmet:  ?Not Applicable ? ?Comments: Service time is from 1317 to 1438 ? ? ? ?Dr. Rodman Pickle is Medical Director for Pulmonary Rehab at [facility].  ?

## 2022-04-05 NOTE — Progress Notes (Signed)
?Advanced Heart Failure Clinic Consult Note  ? ?Referring Physician: Dr. [name]  ?PCP: [name], MD ?PCP-Cardiologist: [name], MD  ? ?Reason for Consultation: Evaluation for ? exercise induced pulmonary hypertension and clearance for knee surgery  ? ?HPI: ? ?66 y/o male w/ h/o PVCs s/p PVC ablation by [name] w/ recurrence, PAF, RBBB, bradycardia, chronic diastolic heart failure and ? HOCM, chronic right sided heart failure, h/o PE, mild CAD, HTN, Stage II CKD and RA on chronic prednisone.  ? ?Admitted 6/22 w/ dyspnea and CP and diagnosed w/ acute PE. Echo showed mildly reduced LVEF 45-45% and mildly reduced RV. [facility] 6/22 showed mid nonobstructive CAD w/ 20% pRCA, 20% pLCx and 30% mLAD. He was placed on Eliquis for tx of PE but continued to have dyspnea. His shortness of breath was felt to be out of proportion to his very small PE. He was subsequently referred for pulmonology for further evaluation. Felt to have mild COPD. Placed on PRN home O2 and referred to cardiopulmonary rehab.  ? ?RHC 6/22: RA 5 PA 25/4 (14) PCWP 12 Fick 6.2//2.7 ? ?Had repeat 8/22 which showed normalization of LVEF 60-65% with mild LVH, mildly reduced RVF, mild RVE and positive PFO. He was started on Verapamil for AFib/VT/PVCs and monitored on tele.  cMRI showed findings c/w HOCM with LVEF 52% and RVEF 45%.  He underwent ICD placement on 08/07/2021. He was discharged home on Carvedilol and Verapamil.   ? ?Was referred to Dr. Copper for consideration for PFO closure but given size, closure was felt to be of little benefit.  He was also referred to Dr. Owens Shark for evaluation of HOCM. Uncertain if actually HOCM vs hypertensive phenocopy in the setting of prolonged HTN. MD recommended an alpha galactosidase Fabry's eval (pending).   ? ?He is now being considered for left knee arthoplasty surgery for arthritis and needing surgical clearance. He had recent cardiopulmonary test 3/23 that demonstrated elevation of VeVCo2,  suggesting the possibility exercise induced pulmonary HTN, but in conjunction with a low PETCO2 likely reflects a component of end-exercise hyperventilation. ? ?We have been asked to further evaluate and clear for surgery.  ? ?He reports improvement in exercise tolerance/exertional dyspnea since starting cardiopulmonary rehab, though still limited some by arthritic knee pain. No resting dyspnea. Denies exertional CP. Device interrogation shows stable impedence. No VT/VF.  ? ? ?CPX 02/2022  ?FVC 3.36 (75%)      ?FEV1 2.77 (81%)        ?FEV1/FVC 82 (107%)        ?MVV 90 (66%)      ? ?Resting HR: 84 Peak HR: 102   (66% age predicted max HR)  ?BP rest: 122/62 BP peak: 140/66  ?Peak VO2: 20.6 (99% predicted peak VO2)  ?VE/VCO2 slope:  41  ?OUES: 2.51  ?Peak RER: 1.02  ?VE/MVV:  113% \ ?PETCO2 at peak:  25  ? ? ?Review of Systems: [y] = yes, [ ]  = no  ? ?General: Weight gain [ ] ; Weight loss [ ] ; Anorexia [ ] ; Fatigue [ ] ; Fever [ ] ; Chills [ ] ; Weakness [ ]   ?Cardiac: Chest pain/pressure [ ] ; Resting SOB [ ] ; Exertional SOB [Y ]; Orthopnea [ ] ; Pedal Edema [ ] ; Palpitations [ ] ; Syncope [ ] ; Presyncope [ ] ; Paroxysmal nocturnal dyspnea[ ]   ?Pulmonary: Cough [ ] ; Wheezing[ ] ; Hemoptysis[ ] ; Sputum [ ] ; Snoring [ ]   ?GI: Vomiting[ ] ; Dysphagia[ ] ; Melena[ ] ; Hematochezia [ ] ; Heartburn[ ] ; Abdominal pain [ ] ;  Constipation [ ] ; Diarrhea [ ] ; BRBPR [ ]   ?GU: Hematuria[ ] ; Dysuria [ ] ; Nocturia[ ]   ?Vascular: Pain in legs with walking [ ] ; Pain in feet with lying flat [ ] ; Non-healing sores [ ] ; Stroke [ ] ; TIA [ ] ; Slurred speech [ ] ;  ?Neuro: Headaches[ ] ; Vertigo[ ] ; Seizures[ ] ; Paresthesias[ ] ;Blurred vision [ ] ; Diplopia [ ] ; Vision changes [ ]   ?Ortho/Skin: Arthritis [ Y]; Joint pain [Y ]; Muscle pain [ ] ; Joint swelling [Y ]; Back Pain [ ] ; Rash [ ]   ?Psych: Depression[ ] ; Anxiety[ ]   ?Heme: Bleeding problems [ ] ; Clotting disorders [ ] ; Anemia [ ]   ?Endocrine: Diabetes [ ] ; Thyroid dysfunction[ ]  ? ? ?Past Medical  History:  ?Diagnosis Date  ? Ascending aortic aneurysm ([place])   ? a. CT 05/2021 showed 4.4x4.4cm TAA. Stable on chest CTA 12/2021 at 4.4cm.  ? Bilateral lower extremity edema   ? Celiac artery dilatation (HCC)   ? 1.8cm at the celiac trunk on Chest CT 12/2021  ? Chronic gout without tophus   ? followed by dr Gerilyn Nestle    (06-08-2020  per pt last episode right knee 3 wks ago)  ? CKD (chronic kidney disease), stage II   ? nephrology--- Jen Mow PA (10-29-2019 note in epic scanned in  media)  ? Heart failure with mid-range ejection fraction ([place]) 05/27/2021  ? 05/26/21: Left ventricular ejection fraction, by estimation, is 45 to 50%. There is severe asymmetric left ventricular hypertrophy. G1DD present.   ? History of COVID-19 06/07/2021  ? HOCM (hypertrophic obstructive cardiomyopathy) ([place])   ? s/p ICD 07/2021  ? Hypertension   ? followed by cardiology, dr t. turner   (05-14-2018 nuclear study in epic , normal perfusion with nuclear ef 61%)  ? Left hydrocele   ? Low serum potassium level 04/27/2014  ? OSA on CPAP   ? per pt uses every night  ? Palpitations followed by dr t. turner  ? 06-08-2020  still feels palipations due to PVCs when exertion but not with chest pain/ discomfort  ? Pneumonia due to COVID-19 virus   ? PVC's (premature ventricular contractions) cardiologist--- dr t. turner  ? Status post PVC ablation by [name] 2019 with recurrence of frequent PVCs/bigeminy;  prior pseudobradycardia r/t pvcs  ? Rheumatoid arthritis involving multiple sites [facility])   ? rheumotology--- dr a. Gerilyn Nestle  ([facility] in [place])  ? ? ?Current Outpatient Medications  ?Medication Sig Dispense Refill  ? Abatacept (ORENCIA CLICKJECT) 0000000 MG/ML SOAJ Inject into the skin once a week.    ? Acetaminophen-Codeine 300-30 MG tablet Take 1-2 tablets by mouth every 12 (twelve) hours as needed for pain.    ? albuterol (VENTOLIN HFA) 108 (90 Base) MCG/ACT inhaler Inhale 2 puffs into the lungs every 4 (four) hours as needed for wheezing or  shortness of breath. 8 g 2  ? apixaban (ELIQUIS) 5 MG TABS tablet Take 1 tablet by mouth 2 (two) times daily.    ? atorvastatin (LIPITOR) 40 MG tablet Take 1 tablet (40 mg total) by mouth daily. 90 tablet 0  ? Cholecalciferol 1.25 MG (50000 UT) TABS Take 1 tablet by mouth once a week. 12 tablet 0  ? colchicine 0.6 MG tablet Take 1-2 tablets by mouth daily.    ? diclofenac Sodium (VOLTAREN) 1 % GEL Apply topically.    ? fluticasone (FLONASE) 50 MCG/ACT nasal spray Place 1 spray into both nostrils daily.    ? fluticasone-salmeterol (ADVAIR) 250-50 MCG/ACT AEPB     ?  folic acid (FOLVITE) 1 MG tablet Take 1 mg by mouth daily.    ? furosemide (LASIX) 40 MG tablet Take 1 tablet (40 mg total) by mouth daily as needed (weight gain). Take 1 tablet by mouth daily for 3 days, then decrease to only as needed for increase weight gain 30 tablet 3  ? gabapentin (NEURONTIN) 600 MG tablet Take 600 mg by mouth with breakfast, with lunch, and with evening meal.    ? hydrALAZINE (APRESOLINE) 100 MG tablet Take 100 mg by mouth 3 (three) times daily.    ? loratadine (CLARITIN) 10 MG tablet Take 10 mg by mouth daily.    ? Magnesium Oxide 400 MG CAPS Take 1 capsule (400 mg total) by mouth every other day. While taking daily multivitamin with 100mg  magnesium every day 90 capsule 3  ? montelukast (SINGULAIR) 10 MG tablet Take 1 tablet (10 mg total) by mouth at bedtime. 90 tablet 3  ? nebivolol (BYSTOLIC) 5 MG tablet Take 1 tablet (5 mg total) by mouth daily. 90 tablet 3  ? pantoprazole (PROTONIX) 40 MG tablet Take 1 tablet (40 mg total) by mouth 2 (two) times daily. 60 tablet 1  ? potassium chloride SA (KLOR-CON M) 20 MEQ tablet Take 1 tablet by  mouth only when you have to use a Lasix 90 tablet 3  ? sildenafil (REVATIO) 20 MG tablet Take 20 mg by mouth daily as needed (erectile dysfunction).    ? sotalol (BETAPACE) 120 MG tablet Take 1 tablet (120 mg total) by mouth every 12 (twelve) hours. 60 tablet 6  ? spironolactone (ALDACTONE) 25 MG  tablet Take 1 tablet (25 mg total) by mouth daily. 30 tablet 6  ? ?No current facility-administered medications for this encounter.  ? ? ?Allergies  ?Allergen Reactions  ? Amlodipine Besylate Swelling and Other

## 2022-04-09 LAB — SPECIMEN STATUS REPORT

## 2022-04-09 LAB — VITAMIN D 25 HYDROXY (VIT D DEFICIENCY, FRACTURES): Vit D, 25-Hydroxy: 23.1 ng/mL — ABNORMAL LOW (ref 30.0–100.0)

## 2022-04-10 ENCOUNTER — Encounter (HOSPITAL_COMMUNITY)
Admission: RE | Admit: 2022-04-10 | Discharge: 2022-04-10 | Disposition: A | Discharge status: Home / Self Care | Payer: No Typology Code available for payment source | Source: Ambulatory Visit | Attending: Pulmonary Disease | Admitting: Pulmonary Disease

## 2022-04-10 ENCOUNTER — Encounter (HOSPITAL_COMMUNITY): Payer: No Typology Code available for payment source

## 2022-04-10 VITALS — Wt 244.5 lb

## 2022-04-10 DIAGNOSIS — J9611 Chronic respiratory failure with hypoxia: Secondary | ICD-10-CM | POA: Insufficient documentation

## 2022-04-10 NOTE — Progress Notes (Signed)
Daily Session Note ? ?Patient Details  ?Name: Ryan Glass ?MRN: [id number] ?Date of Birth: [birth year] ?Referring Provider:   ?Flowsheet Row Pulmonary Rehab Walk Test from 02/09/2022 in [place]  ?Referring Provider Dewald  ? ?  ? ? ?Encounter Date: 04/10/2022 ? ?Check In: ? Session Check In - 04/10/22 1459   ? ?  ? Check-In  ? Supervising physician immediately available to respond to emergencies [facility] immediately available   ? Physician(s) Dr, Bonner Puna   ? Location [facility]   ? Staff Present Elmon Else, MS, ACSM-CEP, Exercise Physiologist;Joan Leonia Reeves, RN, BSN;Carlette Wilber Oliphant, RN, BSN;Ramon Dredge, RN, MHA   ? Virtual Visit No   ? Medication changes reported     No   ? Fall or balance concerns reported    No   ? Tobacco Cessation No Change   ? Warm-up and Cool-down Performed as group-led instruction   ? Resistance Training Performed Yes   ? VAD Patient? No   ? PAD/SET Patient? No   ?  ? Pain Assessment  ? Currently in Pain? Yes   ? Pain Score 5    ? Pain Location Back   ? Pain Orientation Lower   ? Pain Descriptors / Indicators Aching   ? Pain Type Chronic pain   ? Pain Onset More than a month ago   ? Pain Frequency Intermittent   ? Multiple Pain Sites Yes   ?  ? 2nd Pain Site  ? Pain Score 5   ? Pain Location Knee   ? Pain Orientation Left;Right   ? Pain Descriptors / Indicators Aching   ? Pain Type Chronic pain   ? Pain Onset More than a month ago   ? Pain Frequency Intermittent   ? ?  ?  ? ?  ? ? ?Capillary Blood Glucose: ?No results found for this or any previous visit (from the past 24 hour(s)). ? ? Exercise Prescription Changes - 04/10/22 1500   ? ?  ? Response to Exercise  ? Blood Pressure (Admit) 152/84   ? Blood Pressure (Exercise) 138/70   ? Blood Pressure (Exit) 106/74   ? Heart Rate (Admit) 90 bpm   ? Heart Rate (Exercise) 106 bpm   ? Heart Rate (Exit) 90 bpm   ? Oxygen Saturation (Admit) 93 %   ? Oxygen Saturation (Exercise) 91 %   ?  Oxygen Saturation (Exit) 90 %   ? Rating of Perceived Exertion (Exercise) 11   ? Perceived Dyspnea (Exercise) 1   ? Duration Continue with 30 min of aerobic exercise without signs/symptoms of physical distress.   ? Intensity THRR unchanged   ?  ? Progression  ? Progression Continue to progress workloads to maintain intensity without signs/symptoms of physical distress.   ?  ? Resistance Training  ? Training Prescription Yes   ? Weight blue bands   ? Reps 10-15   ? Time 10 Minutes   ?  ? NuStep  ? Level 4   ? SPM 90   ? Minutes 15   ? METs 2.7   ?  ? Arm Ergometer  ? Level 2   ? Watts 46   ? Minutes 15   ? ?  ?  ? ?  ? ? ?Social History  ? ?Tobacco Use  ?Smoking Status Never  ?Smokeless Tobacco Never  ? ? ?Goals Met:  ?Proper associated with RPD/PD & O2 Sat ?Independence with exercise equipment ?Exercise tolerated well ?  No report of concerns or symptoms today ?Strength training completed today ? ?Goals Unmet:  ?Not Applicable ? ?Comments: Service time is from 1330 to 1435.  ? ? ?Dr. Rodman Pickle is Medical Director for Pulmonary Rehab at [facility].  ?

## 2022-04-11 ENCOUNTER — Telehealth (HOSPITAL_COMMUNITY): Payer: Self-pay | Admitting: *Deleted

## 2022-04-11 NOTE — Telephone Encounter (Signed)
Last office note faxed to [facility] at [phone].  Office note needed for surgical clearance.  ?

## 2022-04-12 ENCOUNTER — Encounter (HOSPITAL_COMMUNITY)
Admission: RE | Admit: 2022-04-12 | Discharge: 2022-04-12 | Disposition: A | Discharge status: Home / Self Care | Payer: No Typology Code available for payment source | Source: Ambulatory Visit | Attending: Pulmonary Disease | Admitting: Pulmonary Disease

## 2022-04-12 ENCOUNTER — Encounter (HOSPITAL_COMMUNITY): Payer: No Typology Code available for payment source

## 2022-04-12 ENCOUNTER — Other Ambulatory Visit: Payer: Self-pay | Admitting: Orthopedic Surgery

## 2022-04-12 DIAGNOSIS — J9611 Chronic respiratory failure with hypoxia: Secondary | ICD-10-CM | POA: Diagnosis not present

## 2022-04-12 NOTE — Progress Notes (Signed)
Daily Session Note ? ?Patient Details  ?Name: Ryan Glass ?MRN: [id number] ?Date of Birth: May 23, 1956 ?Referring Provider:   ?Flowsheet Row Pulmonary Rehab Walk Test from 02/09/2022 in [place]  ?Referring Provider Dewald  ? ?  ? ? ?Encounter Date: 04/12/2022 ? ?Check In: ? Session Check In - 04/12/22 1425   ? ?  ? Check-In  ? Supervising physician immediately available to respond to emergencies [facility] immediately available   ? Physician(s) Dr. Karleen Hampshire   ? Location [facility]   ? Staff Present Rosebud Poles, RN, BSN;Carlette Wilber Oliphant, RN, Quentin Ore, MS, ACSM-CEP, Exercise Physiologist;David Makemson, MS, ACSM-CEP, CCRP, Exercise Physiologist   ? Virtual Visit No   ? Medication changes reported     No   ? Fall or balance concerns reported    No   ? Tobacco Cessation No Change   ? Warm-up and Cool-down Performed as group-led instruction   ? Resistance Training Performed Yes   ? VAD Patient? No   ? PAD/SET Patient? No   ?  ? Pain Assessment  ? Currently in Pain? No/denies   ? ?  ?  ? ?  ? ? ?Capillary Blood Glucose: ?No results found for this or any previous visit (from the past 24 hour(s)). ? ? ? ?Social History  ? ?Tobacco Use  ?Smoking Status Never  ?Smokeless Tobacco Never  ? ? ?Goals Met:  ?Exercise tolerated well ?No report of concerns or symptoms today ? ?Goals Unmet:  ?Not Applicable ? ?Comments: Service time is from 1306 to 52. Completed post 6 MWT. ? ? ?Dr. Rodman Pickle is Medical Director for Pulmonary Rehab at [facility].  ?

## 2022-04-12 NOTE — Telephone Encounter (Signed)
Ov notes and clearance form have been faxed back to [facility]. Nothing further needed at this time.  °

## 2022-04-19 ENCOUNTER — Encounter: Payer: Self-pay | Admitting: Registered Nurse

## 2022-04-19 ENCOUNTER — Telehealth: Payer: Self-pay | Admitting: Registered Nurse

## 2022-04-19 ENCOUNTER — Other Ambulatory Visit: Payer: Self-pay

## 2022-04-19 DIAGNOSIS — T50905A Adverse effect of unspecified drugs, medicaments and biological substances, initial encounter: Secondary | ICD-10-CM

## 2022-04-19 MED ORDER — SOTALOL HCL 120 MG PO TABS: 120.0000 mg | ORAL_TABLET | Freq: Two times a day (BID) | ORAL | 8 refills | Status: DC

## 2022-04-19 NOTE — Telephone Encounter (Signed)
Patient reported has knee replacement surgery scheduled for 04/30/22 at [place].  Post op appt 05/10/22 at [facility] [phone].  Postponed travel to [place] to Aug/Sep hoping to having second knee completed this summer.  Patient here to clarify which medications/supplements to stop one week before surgery per his surgeon's list--multivitamin, NSAIDS all including topical diclofenac gel.  Patient to notify his surgeon regarding eliquis use per paperwork   Instructed patient to contact office today.  Patient reported was instructed for dosing already pre/post op.  Patient checking at home if needs more potassium.  Dose changed to 1 pill daily if taking lasix by cardiology Dr [name].  Patient reported lasix typically every other day now.  Patient denied palpitations or symptoms similar to when potassium low in the past last level checked in April normal and potassium dose changed around same time.  Patient did not want to have level rechecked today or feel he needed it rechecked at this time.  He thinks he has 2 bottles potassium remaining at home and will notify RN [name] tomorrow if he has less than 60 tabs as will not be at work next week and surgery scheduled for week following and anticipate 6-8 weeks before he will be back at work.  Discussed can mail refill to him if needed.  Patient looking forward to surgery as knee pain has been worsening and he wants to be more active again.  Patient verbalized understanding information/instructions, agreed with plan of care and had no further questions at this time.  Per paper chart last potassium fill PDRx Jan 2023 was on 2 tabs BID dosing at that time given 90 days supply. ?

## 2022-04-19 NOTE — Telephone Encounter (Signed)
Patient reported has knee replacement surgery scheduled for 04/30/22 at [address] [facility].  Post op appt 05/10/22 at [address] [phone].  Postponed travel to [place] to Aug/Sep hoping to having second knee completed this summer.  Patient here to clarify which medications on stop one week before surgery list--multivitamin, NSAIDS including topical diclofenac gel.  Patient to notify his surgery on eliquis per paperwork   Instructed patient to contact office today.  Patient reported was instructed for dosing already pre/post op.  Patient checking at home if needs more potassium.  Dose changed to 1 pill daily if taking lasix.  Patient reported lasix typically every other day now.  Dr [name] changed his Rx.  Patient denied palpitations or symptoms similar to when potassium low in the past last level checked in April normal and potassium dose changed around same time.  Patient did not want to have level rechecked today or feel he needed it rechecked at this time.  He thinks he has 2 bottles potassium remaining at home and will notify RN Rolly SalterHaley tomorrow if he has less than 60 tabs as will not be at work next week and surgery scheduled for week following.  Discussed can mail refill to him if needed.  Patient verbalized understanding information/instructions, agreed with plan of care and had no further questions at this time.  Per paper chart last potassium fill PDRx Jan 2023 was on 2 tabs BID dosing at that time given 90 days supply. ?

## 2022-04-20 ENCOUNTER — Other Ambulatory Visit: Payer: Self-pay | Admitting: Internal Medicine

## 2022-04-23 ENCOUNTER — Ambulatory Visit: Payer: No Typology Code available for payment source | Admitting: Internal Medicine

## 2022-04-24 NOTE — Progress Notes (Signed)
Discharge Progress Report ? ?Patient Details  ?Name: Ryan Glass ?MRN: [id number] ?Date of Birth: [birth year] ?Referring Provider:   ?Flowsheet Row Pulmonary Rehab Walk Test from 02/09/2022 in [facility] [place]  ?Referring Provider Dewald  ? ?  ? ? ? ?Number of Visits: 16 ? ?Reason for Discharge:  ?Patient has met program and personal goals. ? ?Smoking History:  ?Social History  ? ?Tobacco Use  ?Smoking Status Never  ?Smokeless Tobacco Never  ? ? ?Diagnosis:  ?Chronic respiratory failure with hypoxia ([place]) ? ?ADL UCSD: ? Pulmonary Assessment Scores   ? ? [place] Name 02/09/22 1109 04/11/22 0857 04/12/22 1459  ?  ? ADL [facility]SD  ? ADL Phase Entry Exit --  ? SOB Score total 74 -- 13  ?  ? CAT Score  ? CAT Score 17 13 --  ?  ? mMRC Score  ? mMRC Score 4 -- 1  ? ?  ?  ? ?  ? ? ?Initial Exercise Prescription: ? Initial Exercise Prescription - 02/09/22 1200   ? ?  ? Date of Initial Exercise RX and Referring Provider  ? Date 02/09/22   ? Referring Provider Dewald   ? Expected Discharge Date 04/12/22   ?  ? [place] Bike  ? Level --   ? Watts --   ? Minutes --   ?  ? NuStep  ? Level 1   ? SPM 70   ? Minutes 15   ?  ? Track  ? Minutes 15   ?  ? Prescription Details  ? Frequency (times per week) 2   ? Duration Progress to 30 minutes of continuous aerobic without signs/symptoms of physical distress   ?  ? Intensity  ? THRR 40-80% of Max Heartrate 62-124   ? Ratings of Perceived Exertion 11-13   ? Perceived Dyspnea 0-4   ?  ? Progression  ? Progression Continue to progress workloads to maintain intensity without signs/symptoms of physical distress.   ?  ? Resistance Training  ? Training Prescription Yes   ? Weight blue bands   ? Reps 10-15   ? ?  ?  ? ?  ? ? ?Discharge Exercise Prescription (Final Exercise Prescription Changes): ? Exercise Prescription Changes - 04/10/22 1500   ? ?  ? Response to Exercise  ? Blood Pressure (Admit) 152/84   ? Blood Pressure (Exercise) 138/70   ? Blood Pressure (Exit) 106/74   ?  Heart Rate (Admit) 90 bpm   ? Heart Rate (Exercise) 106 bpm   ? Heart Rate (Exit) 90 bpm   ? Oxygen Saturation (Admit) 93 %   ? Oxygen Saturation (Exercise) 91 %   ? Oxygen Saturation (Exit) 90 %   ? Rating of Perceived Exertion (Exercise) 11   ? Perceived Dyspnea (Exercise) 1   ? Duration Continue with 30 min of aerobic exercise without signs/symptoms of physical distress.   ? Intensity THRR unchanged   ?  ? Progression  ? Progression Continue to progress workloads to maintain intensity without signs/symptoms of physical distress.   ?  ? Resistance Training  ? Training Prescription Yes   ? Weight blue bands   ? Reps 10-15   ? Time 10 Minutes   ?  ? NuStep  ? Level 4   ? SPM 90   ? Minutes 15   ? METs 2.7   ?  ? Arm Ergometer  ? Level 2   ? Watts 46   ? Minutes  15   ? ?  ?  ? ?  ? ? ?Functional Capacity: ? 6 Minute Walk   ? ? [place] Name 02/09/22 1200 04/12/22 1526  ?  ?  ? 6 Minute Walk  ? Phase Initial Discharge   ? Distance 1285 feet 1410 feet   ? Distance % Change -- 9.73 %   ? Distance Feet Change -- 125 ft   ? Walk Time 6 minutes 6 minutes   ? # of Rest Breaks 0 0   ? MPH 2.43 2.43   ? METS 2.95 3.27   ? RPE 13 7   ? Perceived Dyspnea  3 1   ? VO2 Peak 10.31 11.43   ? Symptoms No No   ? Resting HR 80 bpm 96 bpm   ? Resting BP 142/80 138/82   ? Resting Oxygen Saturation  92 % 97 %   ? Exercise Oxygen Saturation  during 6 min walk 93 % 94 %   ? Max Ex. HR 108 bpm 125 bpm   ? Max Ex. BP 148/80 162/88   ? 2 Minute Post BP 138/78 140/80   ?  ? Interval HR  ? 1 Minute HR 99 105   ? 2 Minute HR 100 111   ? 3 Minute HR 99 102   ? 4 Minute HR 100 125   ? 5 Minute HR 108 104   ? 6 Minute HR 95 112   ? 2 Minute Post HR 81 84   ? Interval Heart Rate? Yes Yes   ?  ? Interval Oxygen  ? Interval Oxygen? Yes Yes   ? Baseline Oxygen Saturation % 92 % 97 %   ? 1 Minute Oxygen Saturation % 94 % 96 %   ? 1 Minute Liters of Oxygen 0 L 0 L   ? 2 Minute Oxygen Saturation % 93 % 94 %   ? 2 Minute Liters of Oxygen 0 L 0 L   ? 3 Minute  Oxygen Saturation % 94 % 95 %   ? 3 Minute Liters of Oxygen 0 L 0 L   ? 4 Minute Oxygen Saturation % 94 % 95 %   ? 4 Minute Liters of Oxygen 0 L 0 L   ? 5 Minute Oxygen Saturation % 94 % 95 %   ? 5 Minute Liters of Oxygen 0 L 0 L   ? 6 Minute Oxygen Saturation % 94 % 96 %   ? 6 Minute Liters of Oxygen 0 L 0 L   ? 2 Minute Post Oxygen Saturation % 94 % 94 %   ? 2 Minute Post Liters of Oxygen 0 L 0 L   ? ?  ?  ? ?  ? ? ?Psychological, QOL, Others - Outcomes: ?PHQ 2/9: ? ?  04/12/2022  ?  3:28 PM 02/09/2022  ? 10:37 AM  ?Depression screen PHQ 2/9  ?Decreased Interest 0 0  ?Down, Depressed, Hopeless 0 0  ?PHQ - 2 Score 0 0  ?Altered sleeping 0 0  ?Tired, decreased energy 0 0  ?Change in appetite 0 0  ?Feeling bad or failure about yourself  0 0  ?Trouble concentrating 0 0  ?Moving slowly or fidgety/restless 0 0  ?Suicidal thoughts 0 0  ?PHQ-9 Score 0 0  ?Difficult doing work/chores Not difficult at all Not difficult at all  ? ? ?Quality of Life: ? ? ?Personal Goals: ?Goals established at orientation with interventions provided to work toward goal. ?  Personal Goals and Risk Factors at Admission - 02/09/22 1039   ? ?  ? Core Components/Risk Factors/Patient Goals on Admission  ?  Weight Management Weight Loss   ? Improve shortness of breath with ADL's Yes   ? Intervention Provide education, individualized exercise plan and daily activity instruction to help decrease symptoms of SOB with activities of daily living.   ? Expected Outcomes Short Term: Improve cardiorespiratory fitness to achieve a reduction of symptoms when performing ADLs;Long Term: Be able to perform more ADLs without symptoms or delay the onset of symptoms   ? ?  ?  ? ?  ?  ? ?Personal Goals Discharge: ? Goals and Risk Factor Review   ? ? [place] Name 03/01/22 0932 03/27/22 0912  ?  ?  ?  ?  ? Core Components/Risk Factors/Patient Goals Review  ? Personal Goals Review Weight Management/Obesity;Develop more efficient breathing techniques such as purse lipped breathing  and diaphragmatic breathing and practicing self-pacing with activity.;Increase knowledge of respiratory medications and ability to use respiratory devices properly.;Improve shortness of breath with ADL's Weight Management/Obesity;Develop more efficient breathing techniques such as purse lipped breathing and diaphragmatic breathing and practicing self-pacing with activity.;Increase knowledge of respiratory medications and ability to use respiratory devices properly.;Improve shortness of breath with ADL's     ? Review Jobany has attended 4 exercise sessions and has not lost any weight, he is limited by osteoarthristis in both knees and is making an appt with Dr. Mayer Camel for evaluation of a knee replacement.  He is not able to take NSAIDS due to a gastric ulcer.  He is only able to exercise on the nustep d/t knee pain. Nino has been not been able to lose any weight and is progressing slowly with his exercise workloads due to bilateral knee pain.  He is in the process of scheduling for one knee replacement and has been told both knees need to be replaced.     ? Expected Outcomes See admission goals See admission goals.     ? ?  ?  ? ?  ? ? ?Exercise Goals and Review: ? Exercise Goals   ? ? [place] Name 02/09/22 1047 02/27/22 0828 03/29/22 0809  ?  ?  ?  ? Exercise Goals  ? Increase Physical Activity Yes Yes Yes    ? Intervention Provide advice, education, support and counseling about physical activity/exercise needs.;Develop an individualized exercise prescription for aerobic and resistive training based on initial evaluation findings, risk stratification, comorbidities and participant's personal goals. Provide advice, education, support and counseling about physical activity/exercise needs.;Develop an individualized exercise prescription for aerobic and resistive training based on initial evaluation findings, risk stratification, comorbidities and participant's personal goals. Provide advice, education, support and counseling  about physical activity/exercise needs.;Develop an individualized exercise prescription for aerobic and resistive training based on initial evaluation findings, risk stratification, comorbidities and par

## 2022-04-26 ENCOUNTER — Telehealth: Payer: Self-pay | Admitting: Physician Assistant

## 2022-04-26 DIAGNOSIS — M1712 Unilateral primary osteoarthritis, left knee: Secondary | ICD-10-CM | POA: Diagnosis present

## 2022-04-26 NOTE — Telephone Encounter (Signed)
Spoke w/ pt about Eliquis dosing pre-op. See 03/23/22 original clearance note for details where I documented on this.

## 2022-04-26 NOTE — Patient Instructions (Addendum)
DUE TO COVID-19 ONLY TWO VISITORS  (aged 66 and older)  ARE ALLOWED TO COME WITH YOU AND STAY IN THE WAITING ROOM ONLY DURING PRE OP AND PROCEDURE.   **NO VISITORS ARE ALLOWED IN THE SHORT STAY AREA OR RECOVERY ROOM!!**   Your procedure is scheduled on: 04/30/22   Report to [facility] Main Entrance    Report to admitting at 8:20 AM   Call this number if you have problems the morning of surgery [phone]   Do not eat food :After Midnight.   After Midnight you may have the following liquids until 8:05 AM DAY OF SURGERY  Water Black Coffee (sugar ok, NO MILK/CREAM OR CREAMERS)  Tea (sugar ok, NO MILK/CREAM OR CREAMERS) regular and decaf                             Plain Jell-O (NO RED)                                           Fruit ices (not with fruit pulp, NO RED)                                     Popsicles (NO RED)                                                                  Juice: apple, WHITE grape, WHITE cranberry Sports drinks like Gatorade (NO RED) Clear broth(vegetable,chicken,beef)   The day of surgery:  Drink ONE (1) Pre-Surgery Clear Ensure at 8:05 AM the morning of surgery. Drink in one sitting. Do not sip.  This drink was given to you during your hospital  pre-op appointment visit. Nothing else to drink after completing the  Pre-Surgery Clear Ensure.          If you have questions, please contact your surgeon's office.   FOLLOW BOWEL PREP AND ANY ADDITIONAL PRE OP INSTRUCTIONS YOU RECEIVED FROM YOUR SURGEON'S OFFICE!!!     Oral Hygiene is also important to reduce your risk of infection.                                    Remember - BRUSH YOUR TEETH THE MORNING OF SURGERY WITH YOUR REGULAR TOOTHPASTE   Take these medicines the morning of surgery with A SIP OF WATER: Codeine, Inhalers, Atorvastatin, Gabapentin, Claritin, Hydralazine, Nebivolol, Sotalol.   These are anesthesia recommendations for holding your anticoagulants.  Please contact your  prescribing physician to confirm IF it is safe to hold your anticoagulants for this length of time.   Eliquis Apixaban   72 hours   Xarelto Rivaroxaban   72 hours  Plavix Clopidogrel   120 hours  Pletal Cilostazol   120 hours    Bring CPAP mask and tubing day of surgery.  You may not have any metal on your body including jewelry, and body piercing             Do not wear lotions, powders, cologne, or deodorant  Do not shave  48 hours prior to surgery.    Do not bring valuables to the hospital. [place].   Contacts, dentures or bridgework may not be worn into surgery.    Patients discharged on the day of surgery will not be allowed to drive home.  Someone NEEDS to stay with you for the first 24 hours after anesthesia.   Special Instructions: Bring a copy of your healthcare power of attorney and living will documents         the day of surgery if you haven't scanned them before.              Please read over the following fact sheets you were given: IF YOU HAVE QUESTIONS ABOUT YOUR PRE-OP INSTRUCTIONS PLEASE CALL [facility] - Preparing for Surgery Before surgery, you can play an important role.  Because skin is not sterile, your skin needs to be as free of germs as possible.  You can reduce the number of germs on your skin by washing with CHG (chlorahexidine gluconate) soap before surgery.  CHG is an antiseptic cleaner which kills germs and bonds with the skin to continue killing germs even after washing. Please DO NOT use if you have an allergy to CHG or antibacterial soaps.  If your skin becomes reddened/irritated stop using the CHG and inform your nurse when you arrive at Short Stay. Do not shave (including legs and underarms) for at least 48 hours prior to the first CHG shower.  You may shave your face/neck.  Please follow these instructions carefully:  1.  Shower with CHG Soap  the night before surgery and the  morning of surgery.  2.  If you choose to wash your hair, wash your hair first as usual with your normal  shampoo.  3.  After you shampoo, rinse your hair and body thoroughly to remove the shampoo.                             4.  Use CHG as you would any other liquid soap.  You can apply chg directly to the skin and wash.  Gently with a scrungie or clean washcloth.  5.  Apply the CHG Soap to your body ONLY FROM THE NECK DOWN.   Do   not use on face/ open                           Wound or open sores. Avoid contact with eyes, ears mouth and   genitals (private parts).                       Wash face,  Genitals (private parts) with your normal soap.             6.  Wash thoroughly, paying special attention to the area where your    surgery  will be performed.  7.  Thoroughly rinse your body with warm water from the neck down.  8.  DO NOT shower/wash with your normal soap after using and rinsing  off the CHG Soap.                9.  Pat yourself dry with a clean towel.            10.  Wear clean pajamas.            11.  Place clean sheets on your bed the night of your first shower and do not  sleep with pets. Day of Surgery : Do not apply any lotions/deodorants the morning of surgery.  Please wear clean clothes to the hospital/surgery center.  FAILURE TO FOLLOW THESE INSTRUCTIONS MAY RESULT IN THE CANCELLATION OF YOUR SURGERY  PATIENT SIGNATURE_________________________________  NURSE SIGNATURE__________________________________  ________________________________________________________________________   Ryan Glass  An incentive spirometer is a tool that can help keep your lungs clear and active. This tool measures how well you are filling your lungs with each breath. Taking long deep breaths may help reverse or decrease the chance of developing breathing (pulmonary) problems (especially infection) following: A long period of time when you are unable to  move or be active. BEFORE THE PROCEDURE  If the spirometer includes an indicator to show your best effort, your nurse or respiratory therapist will set it to a desired goal. If possible, sit up straight or lean slightly forward. Try not to slouch. Hold the incentive spirometer in an upright position. INSTRUCTIONS FOR USE  Sit on the edge of your bed if possible, or sit up as far as you can in bed or on a chair. Hold the incentive spirometer in an upright position. Breathe out normally. Place the mouthpiece in your mouth and seal your lips tightly around it. Breathe in slowly and as deeply as possible, raising the piston or the ball toward the top of the column. Hold your breath for 3-5 seconds or for as long as possible. Allow the piston or ball to fall to the bottom of the column. Remove the mouthpiece from your mouth and breathe out normally. Rest for a few seconds and repeat Steps 1 through 7 at least 10 times every 1-2 hours when you are awake. Take your time and take a few normal breaths between deep breaths. The spirometer may include an indicator to show your best effort. Use the indicator as a goal to work toward during each repetition. After each set of 10 deep breaths, practice coughing to be sure your lungs are clear. If you have an incision (the cut made at the time of surgery), support your incision when coughing by placing a pillow or rolled up towels firmly against it. Once you are able to get out of bed, walk around indoors and cough well. You may stop using the incentive spirometer when instructed by your caregiver.  RISKS AND COMPLICATIONS Take your time so you do not get dizzy or light-headed. If you are in pain, you may need to take or ask for pain medication before doing incentive spirometry. It is harder to take a deep breath if you are having pain. AFTER USE Rest and breathe slowly and easily. It can be helpful to keep track of a log of your progress. Your caregiver can  provide you with a simple table to help with this. If you are using the spirometer at home, follow these instructions: [place] IF:  You are having difficultly using the spirometer. You have trouble using the spirometer as often as instructed. Your pain medication is not giving enough relief while using the spirometer. You develop fever  of 100.5 F (38.1 C) or higher. SEEK IMMEDIATE MEDICAL CARE IF:  You cough up bloody sputum that had not been present before. You develop fever of 102 F (38.9 C) or greater. You develop worsening pain at or near the incision site. MAKE SURE YOU:  Understand these instructions. Will watch your condition. Will get help right away if you are not doing well or get worse. Document Released: 04/08/2007 Document Revised: 02/18/2012 Document Reviewed: 06/09/2007 ExitCare Patient Information 2014 Exit[employer], [place].   ________________________________________________________________________  WHAT IS A BLOOD TRANSFUSION? Blood Transfusion Information  A transfusion is the replacement of blood or some of its parts. Blood is made up of multiple cells which provide different functions. Red blood cells carry oxygen and are used for blood loss replacement. White blood cells fight against infection. Platelets control bleeding. Plasma helps clot blood. Other blood products are available for specialized needs, such as hemophilia or other clotting disorders. BEFORE THE TRANSFUSION  Who gives blood for transfusions?  Healthy volunteers who are fully evaluated to make sure their blood is safe. This is blood bank blood. Transfusion therapy is the safest it has ever been in the practice of medicine. Before blood is taken from a donor, a complete history is taken to make sure that person has no history of diseases nor engages in risky social behavior (examples are intravenous drug use or sexual activity with multiple partners). The donor's travel history is screened to  minimize risk of transmitting infections, such as malaria. The donated blood is tested for signs of infectious diseases, such as HIV and hepatitis. The blood is then tested to be sure it is compatible with you in order to minimize the chance of a transfusion reaction. If you or a relative donates blood, this is often done in anticipation of surgery and is not appropriate for emergency situations. It takes many days to process the donated blood. RISKS AND COMPLICATIONS Although transfusion therapy is very safe and saves many lives, the main dangers of transfusion include:  Getting an infectious disease. Developing a transfusion reaction. This is an allergic reaction to something in the blood you were given. Every precaution is taken to prevent this. The decision to have a blood transfusion has been considered carefully by your caregiver before blood is given. Blood is not given unless the benefits outweigh the risks. AFTER THE TRANSFUSION Right after receiving a blood transfusion, you will usually feel much better and more energetic. This is especially true if your red blood cells have gotten low (anemic). The transfusion raises the level of the red blood cells which carry oxygen, and this usually causes an energy increase. The nurse administering the transfusion will monitor you carefully for complications. HOME CARE INSTRUCTIONS  No special instructions are needed after a transfusion. You may find your energy is better. Speak with your caregiver about any limitations on activity for underlying diseases you may have. SEEK MEDICAL CARE IF:  Your condition is not improving after your transfusion. You develop redness or irritation at the intravenous (IV) site. SEEK IMMEDIATE MEDICAL CARE IF:  Any of the following symptoms occur over the next 12 hours: Shaking chills. You have a temperature by mouth above 102 F (38.9 C), not controlled by medicine. Chest, back, or muscle pain. People around you feel  you are not acting correctly or are confused. Shortness of breath or difficulty breathing. Dizziness and fainting. You get a rash or develop hives. You have a decrease in urine output. Your urine turns a  dark color or changes to pink, red, or brown. Any of the following symptoms occur over the next 10 days: You have a temperature by mouth above 102 F (38.9 C), not controlled by medicine. Shortness of breath. Weakness after normal activity. The white part of the eye turns yellow (jaundice). You have a decrease in the amount of urine or are urinating less often. Your urine turns a dark color or changes to pink, red, or brown. Document Released: 11/23/2000 Document Revised: 02/18/2012 Document Reviewed: 07/12/2008 [facility] Patient Information 2014 [place], [place].  _______________________________________________________________________

## 2022-04-26 NOTE — Telephone Encounter (Signed)
   Patient Name: Ryan Glass  DOB: March 17, 1956 MRN: [id number]  Primary Cardiologist: [name], MD  Chart reviewed as part of pre-operative protocol coverage. I was asked by operator to review anticoagulation recommendations. Per notes, Dr. Gala Romney evaluated patient for pre-op clearance at recent OV and granted permission to proceed with knee surgery as requested. Eliquis not specifically referenced in that note. I have spoken urgently with our pharmD Laural Golden about how long to hold. He reviewed chart and recommends a 3 day hold, last dose tonight for surgery Mon 5/22. I spoke with the patient and relayed these recommendations. He verbalized understanding and read them back to me. The patient was advised that if he develops new symptoms prior to surgery or surgery gets delayed to contact our office and he verbalized understanding.  Will route this update to requesting provider via Epic fax function. Please call with questions.   Laurann Montana, PA-C 04/26/2022, 5:07 PM

## 2022-04-26 NOTE — H&P (Signed)
TOTAL KNEE ADMISSION H&P  Patient is being admitted for left total knee arthroplasty.  Subjective:  Chief Complaint:left knee pain.  HPI: Ryan Glass, 66 y.o. male, has a history of pain and functional disability in the left knee due to arthritis and has failed non-surgical conservative treatments for greater than 12 weeks to includeNSAID's and/or analgesics, corticosteriod injections, flexibility and strengthening excercises, use of assistive devices, weight reduction as appropriate, and activity modification.  Onset of symptoms was gradual, starting  several  years ago with gradually worsening course since that time. The patient noted no past surgery on the left knee(s).  Patient currently rates pain in the left knee(s) at 10 out of 10 with activity. Patient has night pain, worsening of pain with activity and weight bearing, pain that interferes with activities of daily living, pain with passive range of motion, crepitus, and joint swelling.  Patient has evidence of subchondral sclerosis, periarticular osteophytes, joint subluxation, and joint space narrowing by imaging studies.  There is no active infection.  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) 08/02/2021   Ventricular tachycardia, unspecified ([place]) 08/02/2021   Acute on chronic  respiratory failure ([place]) 06/07/2021   History of COVID-19 06/07/2021   Rheumatoid arthritis involving multiple sites on Humira ([place]) 06/07/2021   On chronic prednisone therapy for Rheumatoid Arthritis 06/07/2021   CAD (coronary artery disease), non-obstructive 05/30/2021   Precordial chest pain    Heart failure with mid-range ejection fraction ([place]) 05/27/2021   LVH (left ventricular hypertrophy) due to hypertensive disease, with heart failure ([place]) 05/27/2021   Class 2 severe obesity with serious comorbidity and body mass index (BMI) of 35.0 to 35.9 in adult [facility]) 05/26/2021   Pulmonary embolism ([place]) 05/25/2021   Thoracic aortic aneurysm without rupture ([place]) 02/01/2021   Ascending aortic aneurysm ([place]) 02/01/2021   Other hyperlipidemia 10/14/2020   Testicular swelling 05/14/2019   Pain in right knee 01/29/2019   PVC (premature ventricular contraction) 10/07/2018   Ventricular premature depolarization 10/07/2018   Dilated aortic root ([facility])    Allergic rhinitis 01/10/2016   Chronic gout without tophus 01/10/2016   Drug therapy 01/10/2016   OSA (obstructive sleep apnea) 01/10/2016   Colonic polyp 12/14/2015   Rt groin pain 12/14/2015   Scoliosis of thoracolumbar spine 12/14/2015   Right lower quadrant abdominal pain 04/20/2015   Arthritis of knee 02/22/2015   Edema of extremities 02/22/2015   Essential hypertension 02/22/2015   Preventative health care 02/22/2015   Bradycardia by electrocardiogram 04/16/2014   Past Medical History:  Diagnosis Date   Ascending aortic aneurysm ([place])    a. CT 05/2021 showed 4.4x4.4cm TAA. Stable on chest CTA 12/2021 at 4.4cm.   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 There is severe asymmetric left ventricular hypertrophy. G1DD present.    History of COVID-19 06/07/2021   HOCM (hypertrophic obstructive cardiomyopathy) ([place])    s/p ICD 07/2021   Hypertension    followed by cardiology, dr t. turner   (05-14-2018 nuclear study in epic , normal perfusion with nuclear ef 61%)   Left hydrocele    Low serum potassium level 04/27/2014   OSA on CPAP    per pt uses every night   Palpitations followed by dr t. [name]   06-08-2020  still feels palipations due to PVCs when exertion but not with chest pain/ discomfort   Pneumonia due to COVID-19 virus    PVC's (premature ventricular contractions) cardiologist--- dr t. turner   Status post PVC ablation by [name] 2019 with recurrence of frequent PVCs/bigeminy;  prior pseudobradycardia r/t pvcs   Rheumatoid arthritis involving multiple sites [facility])    rheumotology--- dr a. Gerilyn Nestle  ([facility] in [place])    Past Surgical History:  Procedure Laterality Date   BIOPSY  10/08/2021   Procedure: BIOPSY;  Surgeon: Daryel November, MD;  Location: [place];  Service: Gastroenterology;;   Kathleen Argue STUDY  08/18/2021   Procedure: BUBBLE STUDY;  Surgeon: Josue Hector, MD;  Location: [facility] ENDOSCOPY;  Service: Cardiovascular;;   ESOPHAGOGASTRODUODENOSCOPY N/A 10/08/2021   Procedure: ESOPHAGOGASTRODUODENOSCOPY (EGD);  Surgeon: Daryel November, MD;  Location: [place];  Service: Gastroenterology;  Laterality: N/A;   HYDROCELE EXCISION Left 06/14/2020   Procedure: LEFT  HYDROCELECTOMY ADULT;  Surgeon: Robley Fries, MD;  Location: [facility];  Service: Urology;  Laterality: Left;   ICD IMPLANT N/A 08/07/2021   Procedure: ICD IMPLANT;  Surgeon: Evans Lance, MD;  Location: [place] CV LAB;  Service: Cardiovascular;  Laterality: N/A;    INCISIONAL HERNIA REPAIR  02-23-2016   @[facility]    LAPAROSCOPIC   LAPAROSCOPIC INGUINAL HERNIA REPAIR Bilateral 08-22-2015  @[facility]    AND UMBILICAL HERNIA REPAIR   PVC ABLATION N/A 10/07/2018   Procedure: PVC ABLATION;  Surgeon: Evans Lance, MD;  Location: [place] CV LAB;  Service: Cardiovascular;  Laterality: N/A;   RIGHT/LEFT HEART CATH AND CORONARY ANGIOGRAPHY N/A 05/29/2021   Procedure: RIGHT/LEFT HEART CATH AND CORONARY ANGIOGRAPHY;  Surgeon: Burnell Blanks, MD;  Location: [place] CV LAB;  Service: Cardiovascular;  Laterality: N/A;   TEE WITHOUT CARDIOVERSION N/A 08/18/2021   Procedure: TRANSESOPHAGEAL ECHOCARDIOGRAM (TEE);  Surgeon: Josue Hector, MD;  Location: [facility] ENDOSCOPY;  Service: Cardiovascular;  Laterality: N/A;   [place]  child    No current facility-administered medications for this encounter.   Current Outpatient Medications  Medication Sig Dispense Refill Last Dose   Abatacept (ORENCIA CLICKJECT) 0000000 MG/ML SOAJ Inject 125 mg into the skin once a week.      Acetaminophen-Codeine 300-30 MG tablet Take 1-2 tablets by mouth every 12 (twelve) hours as needed for pain.      albuterol (VENTOLIN HFA) 108 (90 Base) MCG/ACT inhaler Inhale 2 puffs into the lungs every 4 (four) hours as needed for wheezing or shortness of breath. 8 g 2    apixaban (ELIQUIS) 5 MG TABS tablet Take  5 mg by mouth 2 (two) times daily.      atorvastatin (LIPITOR) 40 MG tablet Take 1 tablet (40 mg total) by mouth daily. 90 tablet 0    Cholecalciferol 1.25 MG (50000 UT) TABS Take 1 tablet by mouth once a week. 12 tablet 0    colchicine 0.6 MG tablet Take 0.6 mg by mouth daily.      fluticasone (FLONASE) 50 MCG/ACT nasal spray Place 1 spray into both nostrils daily. (Patient taking differently: Place 1 spray into both nostrils daily as needed for allergies.)      fluticasone-salmeterol (ADVAIR) 250-50 MCG/ACT AEPB Inhale 1 puff into the lungs 2 (two) times daily as needed  (shortness of breath).      folic acid (FOLVITE) 1 MG tablet Take 1 mg by mouth daily.      furosemide (LASIX) 40 MG tablet Take 1 tablet (40 mg total) by mouth daily as needed (weight gain). Take 1 tablet by mouth daily for 3 days, then decrease to only as needed for increase weight gain 30 tablet 3    gabapentin (NEURONTIN) 600 MG tablet Take 600 mg by mouth with breakfast, with lunch, and with evening meal.      hydrALAZINE (APRESOLINE) 100 MG tablet Take 100 mg by mouth 3 (three) times daily.      loratadine (CLARITIN) 10 MG tablet Take 10 mg by mouth daily.      Magnesium Oxide 400 MG CAPS Take 1 capsule (400 mg total) by mouth every other day. While taking daily multivitamin with 100mg  magnesium every day 90 capsule 3    montelukast (SINGULAIR) 10 MG tablet Take 1 tablet (10 mg total) by mouth at bedtime. 90 tablet 3    nebivolol (BYSTOLIC) 5 MG tablet Take 1 tablet (5 mg total) by mouth daily. 90 tablet 3    potassium chloride SA (KLOR-CON M) 20 MEQ tablet Take 1 tablet by  mouth only when you have to use a Lasix 90 tablet 3    sotalol (BETAPACE) 120 MG tablet Take 1 tablet (120 mg total) by mouth every 12 (twelve) hours. 60 tablet 8    spironolactone (ALDACTONE) 25 MG tablet Take 1 tablet (25 mg total) by mouth daily. 30 tablet 6    pantoprazole (PROTONIX) 40 MG tablet Take 1 tablet (40 mg total) by mouth 2 (two) times daily. (Patient not taking: Reported on 04/25/2022) 60 tablet 1 Not Taking   sildenafil (REVATIO) 20 MG tablet Take 20 mg by mouth daily as needed (erectile dysfunction).      Allergies  Allergen Reactions   Amlodipine Besylate Swelling and Other (See Comments)    Leg swelling with 10 mg daily am dosing; decreased with BID 5 mg dosing   Aspirin Itching    Social History   Tobacco Use   Smoking status: Never   Smokeless tobacco: Never  Substance Use Topics   Alcohol use: Not Currently    Alcohol/week: 5.0 - 7.0 standard drinks    Types: 5 - 7 Cans of beer per week     Comment: Stop drinking alcohol 3 months ago    Family History  Problem Relation Age of Onset   Cardiomyopathy Mother    Heart attack Father      Review of Systems  Constitutional: Negative.   HENT:  Positive for sinus pain.   Eyes: Negative.   Respiratory:  Positive for shortness of breath.   Cardiovascular:  Positive for leg swelling.       Htn  Endocrine: Negative.   Genitourinary: Negative.   Musculoskeletal:  Positive for arthralgias.  Allergic/Immunologic: Negative.   Neurological: Negative.   Hematological: Negative.   Psychiatric/Behavioral: Negative.     Objective:  Physical Exam Constitutional:      Appearance: Normal appearance. He is normal weight.  HENT:     Head: Normocephalic and atraumatic.     Nose: Nose normal.  Eyes:     Pupils: Pupils are equal, round, and reactive to light.  Cardiovascular:     Pulses: Normal pulses.  Pulmonary:     Effort: Pulmonary effort is normal.  Musculoskeletal:     Cervical back: Normal range of motion and neck supple.     Comments: he has a range from 5-120 bilaterally.  He does have continued edema in his lower legs/ankle region.  Calves are minimally tender.  Skin:    General: Skin is warm and dry.  Neurological:     General: No focal deficit present.     Mental Status: He is alert and oriented to person, place, and time. Mental status is at baseline.  Psychiatric:        Mood and Affect: Mood normal.        Behavior: Behavior normal.        Thought Content: Thought content normal.        Judgment: Judgment normal.    Vital signs in last 24 hours:    Labs:   Estimated body mass index is 37.17 kg/m as calculated from the following:   Height as of 04/05/22: 5\' 8"  (1.727 m).   Weight as of 04/10/22: 110.9 kg.   Imaging Review Plain radiographs demonstrate AP Rosenberg lateral and sunrise x-rays show bilateral end-stage arthritis medial compartment bone-on-bone with erosion of the medial tibial plateaus 1-2  mm.  Lateral subluxation of tibia is been the femurs 4-5 mm bilaterally.   Assessment/Plan:  End stage arthritis, left knee   The patient history, physical examination, clinical judgment of the provider and imaging studies are consistent with end stage degenerative joint disease of the left knee(s) and total knee arthroplasty is deemed medically necessary. The treatment options including medical management, injection therapy arthroscopy and arthroplasty were discussed at length. The risks and benefits of total knee arthroplasty were presented and reviewed. The risks due to aseptic loosening, infection, stiffness, patella tracking problems, thromboembolic complications and other imponderables were discussed. The patient acknowledged the explanation, agreed to proceed with the plan and consent was signed. Patient is being admitted for inpatient treatment for surgery, pain control, PT, OT, prophylactic antibiotics, VTE prophylaxis, progressive ambulation and ADL's and discharge planning. The patient is planning to be discharged home with home health services    Anticipated LOS equal to or greater than 2 midnights due to - Age 64 and older with one or more of the following:  - Obesity  - Expected need for hospital services (PT, OT, Nursing) required for safe  discharge  - Anticipated need for postoperative skilled nursing care or inpatient rehab  - Active co-morbidities: Cardiac Arrhythmia and heart failure

## 2022-04-26 NOTE — Telephone Encounter (Signed)
Patient is following up regarding clearance for upcoming procedure. Specifically, he would like to discuss whether he is able to hold Eliquis, ASAP as procedure is scheduled for 5/22. Looks like clearance was deferred to 4/27 appointment with Dr. Gala Romney, but no advisement on medication provided. Pre-op covering unavailable for direct advisement to patient. Please advise on blood thinner as able.

## 2022-04-26 NOTE — Progress Notes (Addendum)
COVID Vaccine Completed: yes x4  Date of COVID positive in last 90 days:  PCP - Rolan Lipa, MD Cardiologist - [name], MD Electrophysiologist- [name], MD  Cardiac clearance by Glori Bickers 04/05/22 Epic Pulmonary clearance by Geraldo Pitter 04/05/22 Epic Medical clearance by Rolan Lipa 03/20/22 Epic  Chest x-ray - 02/06/22 Epic EKG - 04/05/22 Epic Stress Test - 02/14/22 Epic  ECHO - 02/15/22 [facility] Cardiac Cath - 05/29/21 Epic Pacemaker/ICD device last checked: 03/15/22 Epic  Spinal Cord Stimulator: n/a  Bowel Prep - no  Sleep Study - yes, positive CPAP - yes every night  Fasting Blood Sugar - n/a Checks Blood Sugar _____ times a day  Blood Thinner Instructions: Eliquis, hold 3 days Aspirin Instructions: Last Dose: 04/26/22 1800  Activity level: Can go up a flight of stairs and perform activities of daily living without stopping and without symptoms of chest pain or shortness of breath.       Anesthesia review: HTN,  a fib, PVC, PE, CAD, HF, OSA, ICD  Patient denies shortness of breath, fever, cough and chest pain at PAT appointment   Patient verbalized understanding of instructions that were given to them at the PAT appointment. Patient was also instructed that they will need to review over the PAT instructions again at home before surgery.

## 2022-04-27 ENCOUNTER — Encounter (HOSPITAL_COMMUNITY): Payer: Self-pay

## 2022-04-27 ENCOUNTER — Other Ambulatory Visit: Payer: Self-pay | Admitting: Physician Assistant

## 2022-04-27 ENCOUNTER — Encounter (HOSPITAL_COMMUNITY)
Admission: RE | Admit: 2022-04-27 | Discharge: 2022-04-27 | Disposition: A | Discharge status: Home / Self Care | Payer: No Typology Code available for payment source | Source: Ambulatory Visit | Attending: Orthopedic Surgery | Admitting: Orthopedic Surgery

## 2022-04-27 ENCOUNTER — Encounter: Payer: Self-pay | Admitting: Internal Medicine

## 2022-04-27 VITALS — BP 141/98 | HR 88 | Temp 98.3°F | Resp 16 | Ht 69.0 in | Wt 240.2 lb

## 2022-04-27 DIAGNOSIS — M1712 Unilateral primary osteoarthritis, left knee: Secondary | ICD-10-CM | POA: Diagnosis not present

## 2022-04-27 DIAGNOSIS — I4891 Unspecified atrial fibrillation: Secondary | ICD-10-CM | POA: Insufficient documentation

## 2022-04-27 DIAGNOSIS — M069 Rheumatoid arthritis, unspecified: Secondary | ICD-10-CM | POA: Diagnosis not present

## 2022-04-27 DIAGNOSIS — I509 Heart failure, unspecified: Secondary | ICD-10-CM | POA: Insufficient documentation

## 2022-04-27 DIAGNOSIS — Z8616 Personal history of COVID-19: Secondary | ICD-10-CM | POA: Diagnosis not present

## 2022-04-27 DIAGNOSIS — N182 Chronic kidney disease, stage 2 (mild): Secondary | ICD-10-CM | POA: Insufficient documentation

## 2022-04-27 DIAGNOSIS — Z95810 Presence of automatic (implantable) cardiac defibrillator: Secondary | ICD-10-CM | POA: Diagnosis not present

## 2022-04-27 DIAGNOSIS — Z01812 Encounter for preprocedural laboratory examination: Secondary | ICD-10-CM | POA: Diagnosis present

## 2022-04-27 DIAGNOSIS — G4733 Obstructive sleep apnea (adult) (pediatric): Secondary | ICD-10-CM | POA: Insufficient documentation

## 2022-04-27 DIAGNOSIS — I130 Hypertensive heart and chronic kidney disease with heart failure and stage 1 through stage 4 chronic kidney disease, or unspecified chronic kidney disease: Secondary | ICD-10-CM | POA: Insufficient documentation

## 2022-04-27 DIAGNOSIS — I2510 Atherosclerotic heart disease of native coronary artery without angina pectoris: Secondary | ICD-10-CM | POA: Diagnosis not present

## 2022-04-27 DIAGNOSIS — Z01818 Encounter for other preprocedural examination: Secondary | ICD-10-CM

## 2022-04-27 DIAGNOSIS — I480 Paroxysmal atrial fibrillation: Secondary | ICD-10-CM

## 2022-04-27 HISTORY — DX: Atherosclerotic heart disease of native coronary artery without angina pectoris: I25.10

## 2022-04-27 HISTORY — DX: Unspecified atrial fibrillation: I48.91

## 2022-04-27 HISTORY — DX: Heart failure, unspecified: I50.9

## 2022-04-27 LAB — CBC
HCT: 43.5 % (ref 39.0–52.0)
Hemoglobin: 15 g/dL (ref 13.0–17.0)
MCH: 32.8 pg (ref 26.0–34.0)
MCHC: 34.5 g/dL (ref 30.0–36.0)
MCV: 95.2 fL (ref 80.0–100.0)
Platelets: 165 10*3/uL (ref 150–400)
RBC: 4.57 MIL/uL (ref 4.22–5.81)
RDW: 12.9 % (ref 11.5–15.5)
WBC: 7.6 10*3/uL (ref 4.0–10.5)
nRBC: 0 % (ref 0.0–0.2)

## 2022-04-27 LAB — BASIC METABOLIC PANEL
Anion gap: 8 (ref 5–15)
BUN: 24 mg/dL — ABNORMAL HIGH (ref 8–23)
CO2: 23 mmol/L (ref 22–32)
Calcium: 9.5 mg/dL (ref 8.9–10.3)
Chloride: 108 mmol/L (ref 98–111)
Creatinine, Ser: 1.24 mg/dL (ref 0.61–1.24)
GFR, Estimated: >60 mL/min (ref >60)
Glucose, Bld: 98 mg/dL (ref 70–99)
Potassium: 4.1 mmol/L (ref 3.5–5.1)
Sodium: 139 mmol/L (ref 135–145)

## 2022-04-27 LAB — SURGICAL PCR SCREEN
MRSA, PCR: NEGATIVE
Staphylococcus aureus: POSITIVE — AB

## 2022-04-27 NOTE — Progress Notes (Signed)
PERIOPERATIVE PRESCRIPTION FOR IMPLANTED CARDIAC DEVICE PROGRAMMING  Patient Information: Name:  Ryan Glass  DOB:  [birth year]  MRN:  [id number]    Planned Procedure:  left total knee arthroplasty  Surgeon:  Dr. Mayer Camel  Date of Procedure:  04/30/22  Cautery will be used.  Position during surgery:  unknown   Please send documentation back to:  [facility] (Fax # [phone])  Device Information:  Clinic EP Physician:  [name], MD   Device Type:  Defibrillator Manufacturer and Phone #:  [employer]: [phone] Pacemaker Dependent?:  No. Date of Last Device Check:  04/05/22 ~ Remote Normal Device Function?:  Yes.    Electrophysiologist's Recommendations:  Have magnet available. Provide continuous ECG monitoring when magnet is used or reprogramming is to be performed.  Procedure should not interfere with device function.  No device programming or magnet placement needed.  Per Device Clinic Standing Orders, Simone Curia, RN  8:43 AM 04/27/2022

## 2022-04-27 NOTE — Progress Notes (Signed)
Anesthesia Chart Review   Case: [id number] Date/Time: 04/30/22 1050   Procedure: LEFT TOTAL KNEE ARTHROPLASTY (Left: Knee)   Anesthesia type: Spinal   Pre-op diagnosis: LEFT KNEE OSTEOARTHRITIS   Location: [facility] ROOM 07 / [facility]   Surgeons: Frederik Pear, MD       DISCUSSION:66 y.o. never smoker with h/o OSA on cpap, HTN, CKD Stage II, RA, AAA, AICD in place (last device check 03/15/2022, device orders in progress note 04/27/2022), atrial fibrillation (on Eliquis), CHF, CAD, PE 2022, left knee OA scheduled for above procedure 04/30/2022 with Dr. Frederik Pear.   Per cardiology preoperative evaluation 04/26/2022, "Chart reviewed as part of pre-operative protocol coverage. I was asked by operator to review anticoagulation recommendations. Per notes, Dr. Haroldine Laws evaluated patient for pre-op clearance at recent [place] and granted permission to proceed with knee surgery as requested. Eliquis not specifically referenced in that note. I have spoken urgently with our pharmD Karren Cobble about how long to hold. He reviewed chart and recommends a 3 day hold, last dose tonight for surgery Mon 5/22. I spoke with the patient and relayed these recommendations. He verbalized understanding and read them back to me. The patient was advised that if he develops new symptoms prior to surgery or surgery gets delayed to contact our office and he verbalized understanding."  Pt seen by pulmonology 04/05/2022. Per OV note, "Patient is low-intermediate risk for prolonged mechanical ventilation and/or post op pulmonary complications from surgery d/t history of sleep apnea and reactive airways disease. Pulmonary function testing was normal in August 2022 and CXR was normal on 02/07/22. He is optimized for surgery from pulmonary standpoint, ultimate clearance will be decided by surgeon."  Anticipate pt can proceed with planned procedure barring acute status change.   VS: BP (!) 141/98   Pulse 88   Temp 36.8 C (Oral)   Resp 16   Ht 5\' 9"   (1.753 m)   Wt 109 kg   SpO2 97%   BMI 35.47 kg/m   PROVIDERS: [name], MD is PCP   Cardiologist - [name], MD  Electrophysiologist- [name], MD LABS: Labs reviewed: Acceptable for surgery. (all labs ordered are listed, but only abnormal results are displayed)  Labs Reviewed  SURGICAL PCR SCREEN  BASIC METABOLIC PANEL  CBC  TYPE AND SCREEN     IMAGES:   EKG: 04/05/2022 Rate 90 bpm  Atrial-paced rhythm with prolonged AV conduction Right bundle branch block Marked T-wave abnormality, consider inferolateral ischemia Abnormal ECG When compared with ECG of 07-Oct-2021 18:37, PREVIOUS ECG IS PRESENT Wide QRS tachycardia NO LONGER PRESENT  CV: Echo 08/18/2021 1. Will have Dr Burt Knack review regarding closure of PFO in regard to  patients hypoxemai.   2. Left ventricular ejection fraction, by estimation, is 55 to 60%. The  left ventricle has normal function. There is mild left ventricular  hypertrophy. Left ventricular diastolic function could not be evaluated.   3. AICD wires in RV/RA. Right ventricular systolic function is moderately  reduced. The right ventricular size is moderately enlarged.   4. R/L upper pulmonary veins and LLPV normal Unable to visualize RLPV.  Left atrial size was mildly dilated. No left atrial/left atrial appendage  thrombus was detected.   5. Evidence of atrial level shunting detected by color flow Doppler.  Agitated saline contrast bubble study was positive with shunting observed  within 3-6 cardiac cycles suggestive of interatrial shunt.   6. Right atrial size was mildly dilated.   7. A small pericardial  effusion is present. The pericardial effusion is  anterior to the right ventricle.   8. The mitral valve is grossly normal. Mild mitral valve regurgitation.   9. The aortic valve is tricuspid. Aortic valve regurgitation is mild.  10. Aortic dilatation noted. 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  There is mild dilatation of the ascending aorta,  measuring 41 mm.  Past Medical History:  Diagnosis Date   Ascending aortic aneurysm ([place])    a. CT 05/2021 showed 4.4x4.4cm TAA. Stable on chest CTA 12/2021 at 4.4cm.   Atrial fibrillation (H[facility])    Bilateral lower extremity edema    Celiac artery dilatation (HCC)    1.8cm at the celiac trunk on Chest CT 12/2021   CHF (congestive heart failure) (H[facility])    Chronic gout without tophus    followed by dr Gerilyn Nestle    (06-08-2020  per pt last episode right knee 3 wks ago)   CKD (chronic kidney disease), stage II    nephrology--- Jen Mow PA (10-29-2019 note in epic scanned in  media)   Coronary artery disease    Heart failure with mid-range ejection fraction ([place]) 05/27/2021   05/26/21: Left ventricular ejection fraction, by estimation, is 45 to 50%. There is severe asymmetric left ventricular hypertrophy. G1DD present.    History of COVID-19 06/07/2021   HOCM (hypertrophic obstructive cardiomyopathy) ([place])    s/p ICD 07/2021   Hypertension    followed by cardiology, dr t. turner   (05-14-2018 nuclear study in epic , normal perfusion with nuclear ef 61%)   Left hydrocele    Low serum potassium level 04/27/2014   OSA on CPAP    per pt uses every night   Palpitations followed by dr t. [name]   06-08-2020  still feels palipations due to PVCs when exertion but not with chest pain/ discomfort   Pneumonia due to COVID-19 virus    PVC's (premature ventricular contractions) cardiologist--- dr t. turner   Status post PVC ablation by [name] 2019 with recurrence of frequent PVCs/bigeminy;  prior pseudobradycardia r/t pvcs   Rheumatoid arthritis involving multiple sites [facility])    rheumotology--- dr a. Gerilyn Nestle  ([facility] in [place])    Past Surgical History:  Procedure Laterality Date   BIOPSY  10/08/2021   Procedure: BIOPSY;  Surgeon: Daryel November, MD;  Location: [place];  Service: Gastroenterology;;   Kathleen Argue STUDY  08/18/2021    Procedure: BUBBLE STUDY;  Surgeon: Josue Hector, MD;  Location: [facility] END[facility]COPY;  Service: Cardiovascular;;   ESOPHAGOGASTRODUODENOSCOPY N/A 10/08/2021   Procedure: ESOPHAGOGASTRODUODENOSCOPY (EGD);  Surgeon: Daryel November, MD;  Location: [place];  Service: Gastroenterology;  Laterality: N/A;   HYDROCELE EXCISION Left 06/14/2020   Procedure: LEFT  HYDROCELECTOMY ADULT;  Surgeon: Robley Fries, MD;  Location: [facility];  Service: Urology;  Laterality: Left;   ICD IMPLANT N/A 08/07/2021   Procedure: ICD IMPLANT;  Surgeon: Evans Lance, MD;  Location: [place] CV LAB;  Service: Cardiovascular;  Laterality: N/A;   INCISIONAL HERNIA REPAIR  02-23-2016   @[facility]    LAPAROSCOPIC   LAPAROSCOPIC INGUINAL HERNIA REPAIR Bilateral 08-22-2015  @[facility]    AND UMBILICAL HERNIA REPAIR   PVC ABLATION N/A 10/07/2018   Procedure: PVC ABLATION;  Surgeon: Evans Lance, MD;  Location: [place] CV LAB;  Service: Cardiovascular;  Laterality: N/A;   RIGHT/LEFT HEART CATH AND CORONARY ANGIOGRAPHY N/A 05/29/2021   Procedure: RIGHT/LEFT HEART CATH AND CORONARY ANGIOGRAPHY;  Surgeon: Burnell Blanks, MD;  Location: [place] CV LAB;  Service: Cardiovascular;  Laterality: N/A;   TEE WITHOUT CARDIOVERSION N/A 08/18/2021   Procedure: TRANSESOPHAGEAL ECHOCARDIOGRAM (TEE);  Surgeon: Josue Hector, MD;  Location: [facility] ENDOSCOPY;  Service: Cardiovascular;  Laterality: N/A;   UMBILICAL HERNIA REPAIR  child    MEDICATIONS:  Abatacept (ORENCIA CLICKJECT) 0000000 MG/ML SOAJ   Acetaminophen-Codeine 300-30 MG tablet   albuterol (VENTOLIN HFA) 108 (90 Base) MCG/ACT inhaler   apixaban (ELIQUIS) 5 MG TABS tablet   atorvastatin (LIPITOR) 40 MG tablet   Cholecalciferol 1.25 MG (50000 UT) TABS   colchicine 0.6 MG tablet   fluticasone (FLONASE) 50 MCG/ACT nasal spray   fluticasone-salmeterol (ADVAIR) 250-50 MCG/ACT AEPB   folic acid (FOLVITE) 1 MG tablet   furosemide (LASIX) 40 MG tablet    gabapentin (NEURONTIN) 600 MG tablet   hydrALAZINE (APRESOLINE) 100 MG tablet   loratadine (CLARITIN) 10 MG tablet   Magnesium Oxide 400 MG CAPS   montelukast (SINGULAIR) 10 MG tablet   nebivolol (BYSTOLIC) 5 MG tablet   pantoprazole (PROTONIX) 40 MG tablet   potassium chloride SA (KLOR-CON M) 20 MEQ tablet   sildenafil (REVATIO) 20 MG tablet   sotalol (BETAPACE) 120 MG tablet   spironolactone (ALDACTONE) 25 MG tablet   No current facility-administered medications for this encounter.     Konrad Felix Ward, PA-C [facility] [phone]

## 2022-04-27 NOTE — Telephone Encounter (Signed)
Clearance notes have been faxed to requesting office. See notes from Ronie Spies, [facility]

## 2022-04-27 NOTE — Anesthesia Preprocedure Evaluation (Addendum)
Anesthesia Evaluation  Patient identified by MRN, date of birth, ID band Patient awake    Reviewed: Allergy & Precautions, NPO status , Patient's Chart, lab work & pertinent test results  History of Anesthesia Complications Negative for: history of anesthetic complications  Airway Mallampati: II  TM Distance: >3 FB Neck ROM: Full    Dental  (+) Edentulous Upper, Dental Advisory Given   Pulmonary sleep apnea and Continuous Positive Airway Pressure Ventilation , PE   Pulmonary exam normal        Cardiovascular hypertension, Pt. on medications and Pt. on home beta blockers + CAD and +CHF  Normal cardiovascular exam+ dysrhythmias (PVCs s/p ablation) Atrial Fibrillation and Ventricular Tachycardia + Cardiac Defibrillator   ?HOCM   Neuro/Psych negative neurological ROS     GI/Hepatic negative GI ROS, Neg liver ROS,   Endo/Other  negative endocrine ROSChronic prednisone for RA  Renal/GU CRFRenal disease (CKD2, Cr 1.24)  negative genitourinary   Musculoskeletal  (+) Arthritis , Osteoarthritis and Rheumatoid disorders,    Abdominal   Peds  Hematology negative hematology ROS (+)   Anesthesia Other Findings   Reproductive/Obstetrics                            Anesthesia Physical Anesthesia Plan  ASA: 3  Anesthesia Plan: General   Post-op Pain Management: Tylenol PO (pre-op)* and Regional block*   Induction: Intravenous  PONV Risk Score and Plan: 2 and Ondansetron, Dexamethasone, Midazolam and Treatment may vary due to age or medical condition  Airway Management Planned: LMA  Additional Equipment: None  Intra-op Plan:   Post-operative Plan: Extubation in OR  Informed Consent: I have reviewed the patients History and Physical, chart, labs and discussed the procedure including the risks, benefits and alternatives for the proposed anesthesia with the patient or authorized representative who  has indicated his/her understanding and acceptance.     Dental advisory given  Plan Discussed with:   Anesthesia Plan Comments: (See PAT note 04/27/2022)       Anesthesia Quick Evaluation

## 2022-04-30 ENCOUNTER — Ambulatory Visit (HOSPITAL_COMMUNITY)
Admission: RE | Admit: 2022-04-30 | Discharge: 2022-05-01 | Disposition: A | Discharge status: Home / Self Care | Payer: No Typology Code available for payment source | Attending: Orthopedic Surgery | Admitting: Orthopedic Surgery

## 2022-04-30 ENCOUNTER — Encounter (HOSPITAL_COMMUNITY)
Admission: RE | Disposition: A | Discharge status: Home / Self Care | Payer: Self-pay | Source: Home / Self Care | Attending: Orthopedic Surgery

## 2022-04-30 ENCOUNTER — Other Ambulatory Visit: Payer: Self-pay

## 2022-04-30 ENCOUNTER — Ambulatory Visit (HOSPITAL_BASED_OUTPATIENT_CLINIC_OR_DEPARTMENT_OTHER): Payer: No Typology Code available for payment source | Admitting: Anesthesiology

## 2022-04-30 ENCOUNTER — Ambulatory Visit (HOSPITAL_COMMUNITY): Payer: No Typology Code available for payment source | Admitting: Physician Assistant

## 2022-04-30 ENCOUNTER — Encounter (HOSPITAL_COMMUNITY): Payer: Self-pay | Admitting: Orthopedic Surgery

## 2022-04-30 DIAGNOSIS — M1712 Unilateral primary osteoarthritis, left knee: Secondary | ICD-10-CM

## 2022-04-30 DIAGNOSIS — N182 Chronic kidney disease, stage 2 (mild): Secondary | ICD-10-CM | POA: Diagnosis not present

## 2022-04-30 DIAGNOSIS — R600 Localized edema: Secondary | ICD-10-CM | POA: Insufficient documentation

## 2022-04-30 DIAGNOSIS — I480 Paroxysmal atrial fibrillation: Secondary | ICD-10-CM

## 2022-04-30 DIAGNOSIS — I130 Hypertensive heart and chronic kidney disease with heart failure and stage 1 through stage 4 chronic kidney disease, or unspecified chronic kidney disease: Secondary | ICD-10-CM | POA: Diagnosis not present

## 2022-04-30 DIAGNOSIS — I509 Heart failure, unspecified: Secondary | ICD-10-CM | POA: Insufficient documentation

## 2022-04-30 DIAGNOSIS — Z6835 Body mass index (BMI) 35.0-35.9, adult: Secondary | ICD-10-CM | POA: Diagnosis not present

## 2022-04-30 DIAGNOSIS — R0602 Shortness of breath: Secondary | ICD-10-CM | POA: Insufficient documentation

## 2022-04-30 DIAGNOSIS — M069 Rheumatoid arthritis, unspecified: Secondary | ICD-10-CM | POA: Insufficient documentation

## 2022-04-30 DIAGNOSIS — I2510 Atherosclerotic heart disease of native coronary artery without angina pectoris: Secondary | ICD-10-CM

## 2022-04-30 DIAGNOSIS — Z01818 Encounter for other preprocedural examination: Secondary | ICD-10-CM

## 2022-04-30 DIAGNOSIS — Z96652 Presence of left artificial knee joint: Secondary | ICD-10-CM

## 2022-04-30 HISTORY — DX: Presence of automatic (implantable) cardiac defibrillator: Z95.810

## 2022-04-30 HISTORY — PX: TOTAL KNEE ARTHROPLASTY: SHX125

## 2022-04-30 LAB — TYPE AND SCREEN
ABO/RH(D): B NEG
Antibody Screen: NEGATIVE

## 2022-04-30 SURGERY — ARTHROPLASTY, KNEE, TOTAL
Anesthesia: General | Site: Knee | Laterality: Left

## 2022-04-30 MED ORDER — BUPIVACAINE-EPINEPHRINE (PF) 0.25% -1:200000 IJ SOLN
INTRAMUSCULAR | Status: AC
  Filled 2022-04-30: qty 30

## 2022-04-30 MED ORDER — FENTANYL CITRATE PF 50 MCG/ML IJ SOSY
25.0000 ug | PREFILLED_SYRINGE | INTRAMUSCULAR | Status: DC | PRN
  Administered 2022-04-30 (×2): 50 ug via INTRAVENOUS

## 2022-04-30 MED ORDER — FENTANYL CITRATE (PF) 100 MCG/2ML IJ SOLN
INTRAMUSCULAR | Status: AC
  Filled 2022-04-30: qty 2

## 2022-04-30 MED ORDER — FENTANYL CITRATE PF 50 MCG/ML IJ SOSY: 50.0000 ug | PREFILLED_SYRINGE | Freq: Once | INTRAMUSCULAR | Status: AC

## 2022-04-30 MED ORDER — MIDAZOLAM HCL 2 MG/2ML IJ SOLN
INTRAMUSCULAR | Status: AC
  Administered 2022-04-30: 1 mg via INTRAVENOUS
  Filled 2022-04-30: qty 2

## 2022-04-30 MED ORDER — MENTHOL 3 MG MT LOZG: 1.0000 | LOZENGE | OROMUCOSAL | Status: DC | PRN

## 2022-04-30 MED ORDER — ACETAMINOPHEN 500 MG PO TABS: 500.0000 mg | ORAL_TABLET | Freq: Once | ORAL | Status: DC

## 2022-04-30 MED ORDER — CHLORHEXIDINE GLUCONATE 0.12 % MT SOLN
15.0000 mL | Freq: Once | OROMUCOSAL | Status: AC
  Administered 2022-04-30: 15 mL via OROMUCOSAL

## 2022-04-30 MED ORDER — ACETAMINOPHEN 500 MG PO TABS
1000.0000 mg | ORAL_TABLET | Freq: Four times a day (QID) | ORAL | Status: AC
  Administered 2022-04-30 – 2022-05-01 (×4): 1000 mg via ORAL
  Filled 2022-04-30 (×4): qty 2

## 2022-04-30 MED ORDER — LACTATED RINGERS IV SOLN: INTRAVENOUS | Status: DC

## 2022-04-30 MED ORDER — MAGNESIUM OXIDE -MG SUPPLEMENT 400 (240 MG) MG PO TABS
400.0000 mg | ORAL_TABLET | ORAL | Status: DC
  Administered 2022-05-01: 400 mg via ORAL
  Filled 2022-04-30: qty 1

## 2022-04-30 MED ORDER — APIXABAN 2.5 MG PO TABS: 2.5000 mg | ORAL_TABLET | Freq: Two times a day (BID) | ORAL | 0 refills | Status: DC

## 2022-04-30 MED ORDER — METOCLOPRAMIDE HCL 5 MG/ML IJ SOLN: 5.0000 mg | Freq: Three times a day (TID) | INTRAMUSCULAR | Status: DC | PRN

## 2022-04-30 MED ORDER — BISACODYL 5 MG PO TBEC: 5.0000 mg | DELAYED_RELEASE_TABLET | Freq: Every day | ORAL | Status: DC | PRN

## 2022-04-30 MED ORDER — FENTANYL CITRATE (PF) 250 MCG/5ML IJ SOLN
INTRAMUSCULAR | Status: DC | PRN
  Administered 2022-04-30: 50 ug via INTRAVENOUS
  Administered 2022-04-30: 25 ug via INTRAVENOUS
  Administered 2022-04-30: 50 ug via INTRAVENOUS
  Administered 2022-04-30: 25 ug via INTRAVENOUS
  Administered 2022-04-30: 50 ug via INTRAVENOUS

## 2022-04-30 MED ORDER — HYDROMORPHONE HCL 1 MG/ML IJ SOLN: 0.5000 mg | Freq: Once | INTRAMUSCULAR | Status: DC

## 2022-04-30 MED ORDER — OXYCODONE-ACETAMINOPHEN 5-325 MG PO TABS: 1.0000 | ORAL_TABLET | ORAL | 0 refills | Status: DC | PRN

## 2022-04-30 MED ORDER — LABETALOL HCL 5 MG/ML IV SOLN
INTRAVENOUS | Status: DC | PRN
  Administered 2022-04-30: 5 mg via INTRAVENOUS
  Administered 2022-04-30: 10 mg via INTRAVENOUS

## 2022-04-30 MED ORDER — BUPIVACAINE LIPOSOME 1.3 % IJ SUSP
20.0000 mL | Freq: Once | INTRAMUSCULAR | Status: DC
Start: 2022-04-30 — End: 2022-04-30

## 2022-04-30 MED ORDER — ONDANSETRON HCL 4 MG/2ML IJ SOLN: 4.0000 mg | Freq: Once | INTRAMUSCULAR | Status: DC | PRN

## 2022-04-30 MED ORDER — HYDROMORPHONE HCL 1 MG/ML IJ SOLN
0.2500 mg | INTRAMUSCULAR | Status: DC | PRN
  Administered 2022-04-30 (×2): 0.5 mg via INTRAVENOUS

## 2022-04-30 MED ORDER — DOCUSATE SODIUM 100 MG PO CAPS
100.0000 mg | ORAL_CAPSULE | Freq: Two times a day (BID) | ORAL | Status: DC
  Administered 2022-04-30 – 2022-05-01 (×2): 100 mg via ORAL
  Filled 2022-04-30 (×2): qty 1

## 2022-04-30 MED ORDER — POVIDONE-IODINE 10 % EX SWAB
2.0000 "application " | Freq: Once | CUTANEOUS | Status: AC
  Administered 2022-04-30: 2 via TOPICAL

## 2022-04-30 MED ORDER — PROPOFOL 10 MG/ML IV BOLUS
INTRAVENOUS | Status: DC | PRN
  Administered 2022-04-30: 100 mg via INTRAVENOUS

## 2022-04-30 MED ORDER — SODIUM CHLORIDE (PF) 0.9 % IJ SOLN
INTRAMUSCULAR | Status: AC
  Filled 2022-04-30: qty 50

## 2022-04-30 MED ORDER — BUPIVACAINE LIPOSOME 1.3 % IJ SUSP
INTRAMUSCULAR | Status: DC | PRN
Start: 2022-04-30 — End: 2022-04-30
  Administered 2022-04-30: 20 mL

## 2022-04-30 MED ORDER — LORATADINE 10 MG PO TABS
10.0000 mg | ORAL_TABLET | Freq: Every day | ORAL | Status: DC
  Administered 2022-05-01: 10 mg via ORAL
  Filled 2022-04-30: qty 1

## 2022-04-30 MED ORDER — PANTOPRAZOLE SODIUM 40 MG PO TBEC
40.0000 mg | DELAYED_RELEASE_TABLET | Freq: Every day | ORAL | Status: DC
  Administered 2022-05-01: 40 mg via ORAL
  Filled 2022-04-30: qty 1

## 2022-04-30 MED ORDER — TRANEXAMIC ACID-NACL 1000-0.7 MG/100ML-% IV SOLN
1000.0000 mg | Freq: Once | INTRAVENOUS | Status: AC
  Administered 2022-04-30: 1000 mg via INTRAVENOUS
  Filled 2022-04-30: qty 100

## 2022-04-30 MED ORDER — SPIRONOLACTONE 25 MG PO TABS
25.0000 mg | ORAL_TABLET | Freq: Every day | ORAL | Status: DC
  Administered 2022-05-01: 25 mg via ORAL
  Filled 2022-04-30: qty 1

## 2022-04-30 MED ORDER — DIPHENHYDRAMINE HCL 12.5 MG/5ML PO ELIX: 12.5000 mg | ORAL_SOLUTION | ORAL | Status: DC | PRN

## 2022-04-30 MED ORDER — BUPIVACAINE LIPOSOME 1.3 % IJ SUSP
INTRAMUSCULAR | Status: AC
  Filled 2022-04-30: qty 20

## 2022-04-30 MED ORDER — ACETAMINOPHEN 500 MG PO TABS
1000.0000 mg | ORAL_TABLET | Freq: Once | ORAL | Status: DC
  Filled 2022-04-30: qty 2

## 2022-04-30 MED ORDER — ONDANSETRON HCL 4 MG PO TABS: 4.0000 mg | ORAL_TABLET | Freq: Four times a day (QID) | ORAL | Status: DC | PRN

## 2022-04-30 MED ORDER — ONDANSETRON HCL 4 MG/2ML IJ SOLN
INTRAMUSCULAR | Status: AC
  Filled 2022-04-30: qty 2

## 2022-04-30 MED ORDER — FOLIC ACID 1 MG PO TABS
1.0000 mg | ORAL_TABLET | Freq: Every day | ORAL | Status: DC
  Administered 2022-05-01: 1 mg via ORAL
  Filled 2022-04-30: qty 1

## 2022-04-30 MED ORDER — MIDAZOLAM HCL 2 MG/2ML IJ SOLN
INTRAMUSCULAR | Status: AC
  Filled 2022-04-30: qty 2

## 2022-04-30 MED ORDER — TRANEXAMIC ACID-NACL 1000-0.7 MG/100ML-% IV SOLN
1000.0000 mg | INTRAVENOUS | Status: AC
  Administered 2022-04-30: 1000 mg via INTRAVENOUS
  Filled 2022-04-30: qty 100

## 2022-04-30 MED ORDER — BUPIVACAINE-EPINEPHRINE (PF) 0.25% -1:200000 IJ SOLN
INTRAMUSCULAR | Status: DC | PRN
  Administered 2022-04-30: 30 mL

## 2022-04-30 MED ORDER — TIZANIDINE HCL 2 MG PO TABS: 2.0000 mg | ORAL_TABLET | Freq: Four times a day (QID) | ORAL | 0 refills | Status: DC | PRN

## 2022-04-30 MED ORDER — NEBIVOLOL HCL 5 MG PO TABS
5.0000 mg | ORAL_TABLET | Freq: Every day | ORAL | Status: DC
  Administered 2022-05-01: 5 mg via ORAL
  Filled 2022-04-30: qty 1

## 2022-04-30 MED ORDER — AMISULPRIDE (ANTIEMETIC) 5 MG/2ML IV SOLN: 10.0000 mg | Freq: Once | INTRAVENOUS | Status: DC | PRN

## 2022-04-30 MED ORDER — ACETAMINOPHEN 325 MG PO TABS: 325.0000 mg | ORAL_TABLET | Freq: Four times a day (QID) | ORAL | Status: DC | PRN

## 2022-04-30 MED ORDER — FUROSEMIDE 40 MG PO TABS: 40.0000 mg | ORAL_TABLET | Freq: Every day | ORAL | Status: DC | PRN

## 2022-04-30 MED ORDER — ONDANSETRON HCL 4 MG/2ML IJ SOLN: 4.0000 mg | Freq: Four times a day (QID) | INTRAMUSCULAR | Status: DC | PRN

## 2022-04-30 MED ORDER — OXYCODONE HCL 5 MG PO TABS: 5.0000 mg | ORAL_TABLET | Freq: Once | ORAL | Status: DC | PRN

## 2022-04-30 MED ORDER — MONTELUKAST SODIUM 10 MG PO TABS
10.0000 mg | ORAL_TABLET | Freq: Every day | ORAL | Status: DC
  Administered 2022-04-30: 10 mg via ORAL
  Filled 2022-04-30: qty 1

## 2022-04-30 MED ORDER — POLYETHYLENE GLYCOL 3350 17 G PO PACK: 17.0000 g | PACK | Freq: Every day | ORAL | Status: DC | PRN

## 2022-04-30 MED ORDER — HYDRALAZINE HCL 50 MG PO TABS
100.0000 mg | ORAL_TABLET | Freq: Three times a day (TID) | ORAL | Status: DC
  Administered 2022-04-30 – 2022-05-01 (×3): 100 mg via ORAL
  Filled 2022-04-30 (×3): qty 2

## 2022-04-30 MED ORDER — OXYCODONE HCL 5 MG PO TABS
10.0000 mg | ORAL_TABLET | ORAL | Status: DC | PRN
  Administered 2022-05-01: 10 mg via ORAL
  Administered 2022-05-01 (×2): 15 mg via ORAL
  Filled 2022-04-30: qty 2
  Filled 2022-04-30 (×3): qty 3

## 2022-04-30 MED ORDER — WATER FOR IRRIGATION, STERILE IR SOLN
Status: DC | PRN
  Administered 2022-04-30: 2000 mL

## 2022-04-30 MED ORDER — PHENYLEPHRINE HCL (PRESSORS) 10 MG/ML IV SOLN
INTRAVENOUS | Status: AC
  Filled 2022-04-30: qty 1

## 2022-04-30 MED ORDER — MIDAZOLAM HCL 2 MG/2ML IJ SOLN: 1.0000 mg | Freq: Once | INTRAMUSCULAR | Status: AC

## 2022-04-30 MED ORDER — ORAL CARE MOUTH RINSE: 15.0000 mL | Freq: Once | OROMUCOSAL | Status: AC

## 2022-04-30 MED ORDER — SODIUM CHLORIDE 0.9 % IR SOLN
Status: DC | PRN
  Administered 2022-04-30: 1000 mL

## 2022-04-30 MED ORDER — ROPIVACAINE HCL 5 MG/ML IJ SOLN
INTRAMUSCULAR | Status: DC | PRN
  Administered 2022-04-30: 30 mL via PERINEURAL

## 2022-04-30 MED ORDER — APIXABAN 2.5 MG PO TABS
2.5000 mg | ORAL_TABLET | Freq: Two times a day (BID) | ORAL | Status: DC
  Administered 2022-05-01: 2.5 mg via ORAL
  Filled 2022-04-30: qty 1

## 2022-04-30 MED ORDER — METOCLOPRAMIDE HCL 5 MG PO TABS: 5.0000 mg | ORAL_TABLET | Freq: Three times a day (TID) | ORAL | Status: DC | PRN

## 2022-04-30 MED ORDER — PHENOL 1.4 % MT LIQD: 1.0000 | OROMUCOSAL | Status: DC | PRN

## 2022-04-30 MED ORDER — FENTANYL CITRATE PF 50 MCG/ML IJ SOSY
PREFILLED_SYRINGE | INTRAMUSCULAR | Status: AC
  Administered 2022-04-30: 50 ug via INTRAVENOUS
  Filled 2022-04-30: qty 2

## 2022-04-30 MED ORDER — TRANEXAMIC ACID 1000 MG/10ML IV SOLN
2000.0000 mg | INTRAVENOUS | Status: DC
  Filled 2022-04-30: qty 20

## 2022-04-30 MED ORDER — OXYCODONE HCL 5 MG/5ML PO SOLN: 5.0000 mg | Freq: Once | ORAL | Status: DC | PRN

## 2022-04-30 MED ORDER — HYDROMORPHONE HCL 1 MG/ML IJ SOLN
INTRAMUSCULAR | Status: AC
  Filled 2022-04-30: qty 1

## 2022-04-30 MED ORDER — GABAPENTIN 300 MG PO CAPS
600.0000 mg | ORAL_CAPSULE | Freq: Three times a day (TID) | ORAL | Status: DC
  Administered 2022-04-30 – 2022-05-01 (×3): 600 mg via ORAL
  Filled 2022-04-30 (×3): qty 2

## 2022-04-30 MED ORDER — DEXAMETHASONE SODIUM PHOSPHATE 10 MG/ML IJ SOLN
10.0000 mg | Freq: Once | INTRAMUSCULAR | Status: AC
  Administered 2022-05-01: 10 mg via INTRAVENOUS
  Filled 2022-04-30: qty 1

## 2022-04-30 MED ORDER — FLEET ENEMA 7-19 GM/118ML RE ENEM: 1.0000 | ENEMA | Freq: Once | RECTAL | Status: DC | PRN

## 2022-04-30 MED ORDER — TRANEXAMIC ACID 1000 MG/10ML IV SOLN
INTRAVENOUS | Status: DC | PRN
  Administered 2022-04-30: 2000 mg via TOPICAL

## 2022-04-30 MED ORDER — ALUM & MAG HYDROXIDE-SIMETH 200-200-20 MG/5ML PO SUSP: 30.0000 mL | ORAL | Status: DC | PRN

## 2022-04-30 MED ORDER — FLUTICASONE PROPIONATE 50 MCG/ACT NA SUSP: 1.0000 | Freq: Every day | NASAL | Status: DC | PRN

## 2022-04-30 MED ORDER — ACETAMINOPHEN 325 MG PO TABS
650.0000 mg | ORAL_TABLET | Freq: Once | ORAL | Status: DC
Start: 2022-04-30 — End: 2022-04-30

## 2022-04-30 MED ORDER — CEFAZOLIN SODIUM-DEXTROSE 2-4 GM/100ML-% IV SOLN
2.0000 g | INTRAVENOUS | Status: AC
  Administered 2022-04-30: 2 g via INTRAVENOUS
  Filled 2022-04-30: qty 100

## 2022-04-30 MED ORDER — FLUTICASONE FUROATE-VILANTEROL 200-25 MCG/ACT IN AEPB
1.0000 | INHALATION_SPRAY | Freq: Every day | RESPIRATORY_TRACT | Status: DC
Start: 2022-05-01 — End: 2022-05-01
  Administered 2022-05-01: 1 via RESPIRATORY_TRACT
  Filled 2022-04-30: qty 28

## 2022-04-30 MED ORDER — KCL IN DEXTROSE-NACL 20-5-0.45 MEQ/L-%-% IV SOLN
INTRAVENOUS | Status: DC
  Filled 2022-04-30 (×3): qty 1000

## 2022-04-30 MED ORDER — OXYCODONE HCL 5 MG PO TABS
5.0000 mg | ORAL_TABLET | ORAL | Status: DC | PRN
  Administered 2022-04-30 – 2022-05-01 (×2): 10 mg via ORAL
  Filled 2022-04-30: qty 2

## 2022-04-30 MED ORDER — SOTALOL HCL 120 MG PO TABS
120.0000 mg | ORAL_TABLET | Freq: Two times a day (BID) | ORAL | Status: DC
  Administered 2022-04-30 – 2022-05-01 (×2): 120 mg via ORAL
  Filled 2022-04-30 (×2): qty 1

## 2022-04-30 MED ORDER — COLCHICINE 0.6 MG PO TABS
0.6000 mg | ORAL_TABLET | Freq: Every day | ORAL | Status: DC
Start: 2022-05-01 — End: 2022-05-01
  Administered 2022-05-01: 0.6 mg via ORAL
  Filled 2022-04-30: qty 1

## 2022-04-30 MED ORDER — ALBUTEROL SULFATE (2.5 MG/3ML) 0.083% IN NEBU: 2.5000 mg | INHALATION_SOLUTION | RESPIRATORY_TRACT | Status: DC | PRN

## 2022-04-30 MED ORDER — ONDANSETRON HCL 4 MG/2ML IJ SOLN
INTRAMUSCULAR | Status: DC | PRN
  Administered 2022-04-30: 4 mg via INTRAVENOUS

## 2022-04-30 MED ORDER — SODIUM CHLORIDE (PF) 0.9 % IJ SOLN
INTRAMUSCULAR | Status: DC | PRN
  Administered 2022-04-30: 50 mL

## 2022-04-30 MED ORDER — HYDROMORPHONE HCL 1 MG/ML IJ SOLN
0.5000 mg | INTRAMUSCULAR | Status: DC | PRN
  Administered 2022-04-30: 1 mg via INTRAVENOUS
  Filled 2022-04-30: qty 1

## 2022-04-30 MED ORDER — FENTANYL CITRATE PF 50 MCG/ML IJ SOSY
PREFILLED_SYRINGE | INTRAMUSCULAR | Status: AC
  Filled 2022-04-30: qty 1

## 2022-04-30 MED ORDER — DEXAMETHASONE SODIUM PHOSPHATE 10 MG/ML IJ SOLN
INTRAMUSCULAR | Status: AC
  Filled 2022-04-30: qty 1

## 2022-04-30 MED ORDER — LIDOCAINE 2% (20 MG/ML) 5 ML SYRINGE
INTRAMUSCULAR | Status: DC | PRN
Start: 2022-04-30 — End: 2022-04-30
  Administered 2022-04-30: 60 mg via INTRAVENOUS

## 2022-04-30 MED ORDER — DEXAMETHASONE SODIUM PHOSPHATE 10 MG/ML IJ SOLN
INTRAMUSCULAR | Status: DC | PRN
Start: 2022-04-30 — End: 2022-04-30
  Administered 2022-04-30: 8 mg via INTRAVENOUS

## 2022-04-30 SURGICAL SUPPLY — 55 items
ATTUNE MED DOME PAT 41 KNEE (Knees) ×1 IMPLANT
ATTUNE PS FEM LT SZ 8 CEM KNEE (Femur) ×1 IMPLANT
ATTUNE PSRP INSR SZ8 5 KNEE (Insert) ×1 IMPLANT
BAG COUNTER SPONGE SURGICOUNT (BAG) ×1 IMPLANT
BAG DECANTER FOR FLEXI CONT (MISCELLANEOUS) ×2 IMPLANT
BAG SPEC THK2 15X12 ZIP CLS (MISCELLANEOUS) ×1
BAG SPNG CNTER NS LX DISP (BAG) ×1
BAG ZIPLOCK 12X15 (MISCELLANEOUS) ×2 IMPLANT
BASE TIBIAL ATTUNE KNEE SZ9 (Knees) IMPLANT
BLADE SAG 18X100X1.27 (BLADE) ×2 IMPLANT
BLADE SAW SGTL 11.0X1.19X90.0M (BLADE) ×2 IMPLANT
BLADE SURG SZ10 CARB STEEL (BLADE) ×4 IMPLANT
BNDG CMPR MED 10X6 ELC LF (GAUZE/BANDAGES/DRESSINGS) ×1
BNDG ELASTIC 6X10 VLCR STRL LF (GAUZE/BANDAGES/DRESSINGS) ×2 IMPLANT
BOWL SMART MIX CTS (DISPOSABLE) ×2 IMPLANT
BSPLAT TIB 9 CMNT ROT PLAT STR (Knees) ×1 IMPLANT
CEMENT HV SMART SET (Cement) ×5 IMPLANT
COVER SURGICAL LIGHT HANDLE (MISCELLANEOUS) ×2 IMPLANT
CUFF TOURN SGL QUICK 34 (TOURNIQUET CUFF) ×2
CUFF TRNQT CYL 34X4.125X (TOURNIQUET CUFF) ×1 IMPLANT
DRAPE INCISE IOBAN 66X45 STRL (DRAPES) ×2 IMPLANT
DRAPE U-SHAPE 47X51 STRL (DRAPES) ×2 IMPLANT
DRSG AQUACEL AG ADV 3.5X10 (GAUZE/BANDAGES/DRESSINGS) ×2 IMPLANT
DURAPREP 26ML APPLICATOR (WOUND CARE) ×2 IMPLANT
ELECT REM PT RETURN 15FT ADLT (MISCELLANEOUS) ×2 IMPLANT
GLOVE BIO SURGEON STRL SZ7.5 (GLOVE) ×2 IMPLANT
GLOVE BIO SURGEON STRL SZ8.5 (GLOVE) ×2 IMPLANT
GLOVE BIOGEL PI IND STRL 6.5 (GLOVE) IMPLANT
GLOVE BIOGEL PI IND STRL 8 (GLOVE) ×1 IMPLANT
GLOVE BIOGEL PI IND STRL 9 (GLOVE) ×1 IMPLANT
GLOVE BIOGEL PI INDICATOR 6.5 (GLOVE) ×3
GLOVE BIOGEL PI INDICATOR 8 (GLOVE) ×1
GLOVE BIOGEL PI INDICATOR 9 (GLOVE) ×1
GOWN STRL REUS W/ TWL XL LVL3 (GOWN DISPOSABLE) ×2 IMPLANT
GOWN STRL REUS W/TWL XL LVL3 (GOWN DISPOSABLE) ×4
HANDPIECE INTERPULSE COAX TIP (DISPOSABLE) ×2
HOOD PEEL AWAY FLYTE STAYCOOL (MISCELLANEOUS) ×6 IMPLANT
KIT TURNOVER KIT A (KITS) ×1 IMPLANT
NDL HYPO 21X1.5 SAFETY (NEEDLE) ×2 IMPLANT
NEEDLE HYPO 21X1.5 SAFETY (NEEDLE) ×4 IMPLANT
NS IRRIG 1000ML POUR BTL (IV SOLUTION) ×2 IMPLANT
PACK TOTAL KNEE CUSTOM (KITS) ×2 IMPLANT
PROTECTOR NERVE ULNAR (MISCELLANEOUS) ×2 IMPLANT
SET HNDPC FAN SPRY TIP SCT (DISPOSABLE) ×1 IMPLANT
SPIKE FLUID TRANSFER (MISCELLANEOUS) ×6 IMPLANT
SUT VIC AB 1 CTX 36 (SUTURE) ×2
SUT VIC AB 1 CTX36XBRD ANBCTR (SUTURE) ×1 IMPLANT
SUT VIC AB 3-0 CT1 27 (SUTURE) ×6
SUT VIC AB 3-0 CT1 TAPERPNT 27 (SUTURE) ×3 IMPLANT
SYR CONTROL 10ML LL (SYRINGE) ×4 IMPLANT
TIBIAL BASE ATTUNE KNEE SZ9 (Knees) ×2 IMPLANT
TRAY CATH INTERMITTENT SS 16FR (CATHETERS) IMPLANT
TRAY FOLEY MTR SLVR 16FR STAT (SET/KITS/TRAYS/PACK) ×1 IMPLANT
WATER STERILE IRR 1000ML POUR (IV SOLUTION) ×4 IMPLANT
WRAP KNEE MAXI GEL POST OP (GAUZE/BANDAGES/DRESSINGS) ×2 IMPLANT

## 2022-04-30 NOTE — Interval H&P Note (Signed)
History and Physical Interval Note:  04/30/2022 8:41 AM  Ryan Glass  has presented today for surgery, with the diagnosis of LEFT KNEE OSTEOARTHRITIS.  The various methods of treatment have been discussed with the patient and family. After consideration of risks, benefits and other options for treatment, the patient has consented to  Procedure(s): LEFT TOTAL KNEE ARTHROPLASTY (Left) as a surgical intervention.  The patient's history has been reviewed, patient examined, no change in status, stable for surgery.  I have reviewed the patient's chart and labs.  Questions were answered to the patient's satisfaction.     [name]

## 2022-04-30 NOTE — Progress Notes (Signed)
Orthopedic Tech Progress Note Patient Details:  Ryan Glass  03/27/1956 [id number]  Ortho Devices Type of Ortho Device: Bone foam zero knee Ortho Device/Splint Interventions: Application   Post Interventions Patient Tolerated: Well Instructions Provided: Care of device  Saul FordyceJennifer C Moncia Annas 04/30/2022, 12:32 PM

## 2022-04-30 NOTE — Op Note (Signed)
PATIENT ID:      Nicole KindredJose S Carpino  MRN:     [id number] DOB/AGE:    06/24/1956 / 66 y.o.       OPERATIVE REPORT   DATE OF PROCEDURE:  04/30/2022      PREOPERATIVE DIAGNOSIS:   LEFT KNEE OSTEOARTHRITIS      Estimated body mass index is 35.47 kg/m as calculated from the following:   Height as of 04/27/22: 5\' 9"  (1.753 m).   Weight as of 04/27/22: 109 kg.                                                       POSTOPERATIVE DIAGNOSIS:   Same                                                                  PROCEDURE:  Procedure(s): LEFT TOTAL KNEE ARTHROPLASTY Using DepuyAttune RP implants #8L Femur, #9Tibia, 5 mm Attune RP bearing, 41 Patella    SURGEON: Nestor LewandowskyFrank J Karizma Cheek  ASSISTANT:   Tomi LikensEric K. [employer] PA-C   (Present and scrubbed throughout the case, critical for assistance with exposure, retraction, instrumentation, and closure.)        ANESTHESIA: GET, 20cc Exparel, 50cc 0.25% Marcaine EBL: 300 cc FLUID REPLACEMENT: 1500 cc crystaloid TOURNIQUET: DRAINS: None TRANEXAMIC ACID: 1gm IV, 2gm topical COMPLICATIONS:  None         INDICATIONS FOR PROCEDURE: The patient has  LEFT KNEE OSTEOARTHRITIS, Var deformities, XR shows bone on bone arthritis, lateral subluxation of tibia. Patient has failed all conservative measures including anti-inflammatory medicines, narcotics, attempts at exercise and weight loss, cortisone injections and viscosupplementation.  Risks and benefits of surgery have been discussed, questions answered.   DESCRIPTION OF PROCEDURE: The patient identified by armband, received  IV antibiotics, [facility]. Patient taken to the operating room, appropriate anesthetic monitors were attached, and GET anesthesia was  induced. IV Tranexamic acid was given.Tourniquet applied high to the operative thigh. Lateral post and foot positioner applied to the table, the lower extremity was then prepped and draped in usual sterile fashion from the toes to the tourniquet.  Time-out procedure was performed. Tomi LikensEric K. [facility] PAC, was present and scrubbed throughout the case, critical for assistance with, positioning, exposure, retraction, instrumentation, and closure.The skin and subcutaneous tissue along the incision was injected with 20 cc of a mixture of Exparel and Marcaine solution, using a 20-gauge by 1-1/2 inch needle. We began the operation, with the knee flexed 130 degrees, by making the anterior midline incision starting at handbreadth above the patella going over the patella 1 cm medial to and 4 cm distal to the tibial tubercle. Small bleeders in the skin and the subcutaneous tissue identified and cauterized. Transverse retinaculum was incised and reflected medially and a medial parapatellar arthrotomy was accomplished. the patella was everted and theprepatellar fat pad resected. The superficial medial collateral ligament was then elevated from anterior to posterior along the proximal flare of the tibia and anterior half of the menisci resected. The knee was hyperflexed exposing bone on bone arthritis. Peripheral and notch  osteophytes as well as the cruciate ligaments were then resected. We continued to work our way around posteriorly along the proximal tibia, and externally rotated the tibia subluxing it out from underneath the femur. A McHale PCL retractor was placed through the notch and a lateral Hohmann retractor placed, and we then entered the proximal tibia in line with the Depuy starter drill in line with the axis of the tibia followed by an intramedullary guide rod and 0-degree posterior slope cutting guide. The tibial cutting guide, 4 degree posterior sloped, was pinned into place allowing resection of 5 mm of bone medially and 11 mm of bone laterally. Satisfied with the tibial resection, we then entered the distal femur 2 mm anterior to the PCL origin with the intramedullary guide rod and applied the distal femoral cutting guide set at 10 mm, with 5 degrees of  valgus. This was pinned along the epicondylar axis. At this point, the distal femoral cut was accomplished without difficulty. We then sized for a #8L femoral component and pinned the guide in 3 degrees of external rotation. The chamfer cutting guide was pinned into place. The anterior, posterior, and chamfer cuts were accomplished without difficulty followed by the Attune RP box cutting guide and the box cut. We also removed posterior osteophytes from the posterior femoral condyles. The posterior capsule was injected with Exparel solution. The knee was brought into full extension. We checked our extension gap and fit a 5 mm bearing. Distracting in extension with a lamina spreader,  bleeders in the posterior capsule, Posterior medial and posterior lateral gutter were cauterized.  The transexamic acid-soaked sponge was then placed in the gap of the knee in extension. The knee was flexed 30. The posterior patella cut was accomplished with the 9.5 mm Attune cutting guide, sized for a 41mm dome, and the fixation pegs drilled.The knee was then once again hyperflexed exposing the proximal tibia. We sized for a # 9 tibial base plate, applied the smokestack and the conical reamer followed by the the Delta fin keel punch. We then hammered into place the Attune RP trial femoral component, drilled the lugs, inserted a  5 mm trial bearing, trial patellar button, and took the knee through range of motion from 0-130 degrees. Medial and lateral ligamentous stability was checked. No thumb pressure was required for patellar Tracking. The tourniquet was not used. All trial components were removed, mating surfaces irrigated with pulse lavage, and dried with suction and sponges. 10 cc of the Exparel solution was applied to the cancellus bone of the patella distal femur and proximal tibia.  After waiting 30 seconds, the bony surfaces were again, dried with sponges. A double batch of DePuy HV cement was mixed and applied to all bony  metallic mating surfaces except for the posterior condyles of the femur itself. In order, we hammered into place the tibial tray and removed excess cement, the femoral component and removed excess cement. The final Attune RP bearing was inserted, and the knee brought to full extension with compression. The patellar button was clamped into place, and excess cement removed. The knee was held at 30 flexion with compression, while the cement cured. The wound was irrigated out with normal saline solution pulse lavage. The rest of the Exparel was injected into the parapatellar arthrotomy, subcutaneous tissues, and periosteal tissues. The parapatellar arthrotomy was closed with running #1 Vicryl suture. The subcutaneous tissue with 3-0 undyed Vicryl suture, and the skin with running 3-0 SQ vicryl. An Aquacil and Ace wrap  were applied. The patient was taken to recovery room without difficulty.   [name] 04/30/2022, 8:50 AM

## 2022-04-30 NOTE — Anesthesia Procedure Notes (Signed)
Anesthesia Regional Block: Adductor canal block   Pre-Anesthetic Checklist: , timeout performed,  Correct Patient, Correct Site, Correct Laterality,  Correct Procedure, Correct Position, site marked,  Risks and benefits discussed,  Surgical consent,  Pre-op evaluation,  At surgeon's request and post-op pain management  Laterality: Left  Prep: chloraprep       Needles:  Injection technique: Single-shot  Needle Type: Echogenic Stimulator Needle     Needle Length: 10cm  Needle Gauge: 20     Additional Needles:   Procedures:,,,, ultrasound used (permanent image in chart),,    Narrative:  Start time: 04/30/2022 9:34 AM End time: 04/30/2022 9:38 AM Injection made incrementally with aspirations every 5 mL.  Performed by: Personally  Anesthesiologist: Lucretia Kern, MD  Additional Notes: Standard monitors applied. Skin prepped. Good needle visualization with ultrasound. Injection made in 5cc increments with no resistance to injection. Patient tolerated the procedure well.

## 2022-04-30 NOTE — Plan of Care (Signed)
Problem: Education: Goal: Knowledge of the prescribed therapeutic regimen will improve Outcome: Progressing   Problem: Activity: Goal: Ability to avoid complications of mobility impairment will improve Outcome: Progressing   Problem: Clinical Measurements: Goal: Postoperative complications will be avoided or minimized Outcome: Progressing   Haydee Salter, RN 04/30/22 8:07 PM

## 2022-04-30 NOTE — Transfer of Care (Signed)
Immediate Anesthesia Transfer of Care Note  Patient: Ryan Glass  Procedure(s) Performed: LEFT TOTAL KNEE ARTHROPLASTY (Left: Knee)  Patient Location: PACU  Anesthesia Type:GA combined with regional for post-op pain  Level of Consciousness: awake, alert , oriented and patient cooperative  Airway & Oxygen Therapy: Patient Spontanous Breathing and Patient connected to face mask oxygen  Post-op Assessment: Report given to RN and Post -op Vital signs reviewed and stable  Post vital signs: Reviewed and stable  Last Vitals:  Vitals Value Taken Time  BP    Temp    Pulse    Resp    SpO2      Last Pain:  Vitals:   04/30/22 0932  TempSrc:   PainSc: 0-No pain         Complications: No notable events documented.

## 2022-04-30 NOTE — Discharge Instructions (Signed)

## 2022-04-30 NOTE — Evaluation (Addendum)
Physical Therapy Evaluation Patient Details Name: Ryan Glass MRN: [id number] DOB: [birth year] Today's Date: 04/30/2022  History of Present Illness  Pt is a 66yo male presenting s/p L-TKA on 04/30/22. PMH: AICD, AAA, afib, ICD implant 2022, CHF, gout, CKD2, CAD s/p cath, HTN, OSA on CPAP, RA, several hernia repairs.  Clinical Impression  Ryan Glass is a 66 y.o. male POD 0 s/p L-TKA. Patient reports modified independence using SPC for mobility at baseline. Patient is now limited by functional impairments (see PT problem list below) and requires min assist for bed mobility and transfers; during step pivot transfer pt became lightheaded so further mobility deferred, VSS, RN aware. Pt instructed in exercise to facilitate ROM and circulation to manage edema. Patient will benefit from continued skilled PT interventions to address impairments and progress towards PLOF. Acute PT will follow to progress mobility and stair training in preparation for safe discharge home.       Recommendations for follow up therapy are one component of a multi-disciplinary discharge planning process, led by the attending physician.  Recommendations may be updated based on patient status, additional functional criteria and insurance authorization.  Follow Up Recommendations Follow physician's recommendations for discharge plan and follow up therapies    Assistance Recommended at Discharge Set up Supervision/Assistance  Patient can return home with the following  A little help with walking and/or transfers;A little help with bathing/dressing/bathroom;Assistance with cooking/housework;Assist for transportation;Help with stairs or ramp for entrance    Equipment Recommendations Rolling walker (2 wheels) (Pt reports he has a RW, but then said he has a rollator with a seat. Unable to clarify further.)  Recommendations for Other Services       Functional Status Assessment Patient has had a recent decline in their functional  status and demonstrates the ability to make significant improvements in function in a reasonable and predictable amount of time.     Precautions / Restrictions Precautions Precautions: Fall Restrictions Weight Bearing Restrictions: No Other Position/Activity Restrictions: WBAT      Mobility  Bed Mobility Overal bed mobility: Needs Assistance Bed Mobility: Supine to Sit     Supine to sit: Min assist     General bed mobility comments: Min assist to advance RLE off bed.    Transfers Overall transfer level: Needs assistance Equipment used: Rolling walker (2 wheels) Transfers: Sit to/from Stand, Bed to chair/wheelchair/BSC Sit to Stand: Min guard   Step pivot transfers: Min assist       General transfer comment: Pt min guard for sit to stand transfer, multimodal cues for sequencing and hand placement and powering up through LLE. For step pivot transfer, min assist for steadying and moving RW, VCs for sequencing. Pt reporting lightheadedness during movement, directed pt to sit in recliner with feet elevated and back reclined; after ~30s pt reporting decrease in symptoms, VSS, further mobility deferred.    Ambulation/Gait               General Gait Details: deferred  Stairs            Wheelchair Mobility    Modified Rankin (Stroke Patients Only)       Balance Overall balance assessment: Needs assistance Sitting-balance support: Feet supported, No upper extremity supported Sitting balance-Leahy Scale: Fair     Standing balance support: Reliant on assistive device for balance, During functional activity, Bilateral upper extremity supported Standing balance-Leahy Scale: Poor  Pertinent Vitals/Pain Pain Assessment Pain Assessment: 0-10 Pain Score: 9  Pain Descriptors / Indicators: Operative site guarding, Guarding    Home Living Family/patient expects to be discharged to:: Private residence Living  Arrangements: Spouse/significant other Available Help at Discharge: Family;Available 24 hours/day Type of Home: House Home Access: Stairs to enter Entrance Stairs-Rails: Left;Can reach Advertising account executive of Steps: 5-6 Alternate Level Stairs-Number of Steps: Flight Home Layout: Multi-level (Sleeps on the first floor, bathroom is on the third floor.) Home Equipment: Grab bars - tub/shower;Cane - single Barista (2 wheels) Additional Comments: one railing to the third floor that has been pulled out previously but has since been repaired but may not be secure    Prior Function Prior Level of Function : Independent/Modified Independent             Mobility Comments: SPC while in pain, RW if out in the community ADLs Comments: IND     Hand Dominance   Dominant Hand: Right    Extremity/Trunk Assessment   Upper Extremity Assessment Upper Extremity Assessment: Overall WFL for tasks assessed    Lower Extremity Assessment Lower Extremity Assessment: RLE deficits/detail;LLE deficits/detail RLE Deficits / Details: MMT ank DF/PF 5/5 RLE Sensation: WNL LLE Deficits / Details: MMT ank DF/PF 5/5, no extensor lag noted LLE Sensation: WNL    Cervical / Trunk Assessment Cervical / Trunk Assessment: Normal  Communication   Communication: No difficulties  Cognition Arousal/Alertness: Awake/alert Behavior During Therapy: WFL for tasks assessed/performed Overall Cognitive Status: Within Functional Limits for tasks assessed                                          General Comments      Exercises Total Joint Exercises Ankle Circles/Pumps: AROM, 20 reps, Both Other Exercises Other Exercises: incentive spirometry x5, VCs for inhalation slow and controlled   Assessment/Plan    PT Assessment Patient needs continued PT services  PT Problem List Decreased strength;Decreased range of motion;Decreased activity tolerance;Decreased  balance;Decreased mobility;Decreased coordination;Decreased knowledge of use of DME;Pain       PT Treatment Interventions DME instruction;Gait training;Stair training;Functional mobility training;Therapeutic activities;Therapeutic exercise;Balance training;Neuromuscular re-education;Patient/family education    PT Goals (Current goals can be found in the Care Plan section)  Acute Rehab PT Goals Patient Stated Goal: walk without pain PT Goal Formulation: With patient Time For Goal Achievement: 05/07/22 Potential to Achieve Goals: Good    Frequency 7X/week     Co-evaluation               AM-PAC PT "6 Clicks" Mobility  Outcome Measure Help needed turning from your back to your side while in a flat bed without using bedrails?: None Help needed moving from lying on your back to sitting on the side of a flat bed without using bedrails?: A Little Help needed moving to and from a bed to a chair (including a wheelchair)?: A Little Help needed standing up from a chair using your arms (e.g., wheelchair or bedside chair)?: A Little Help needed to walk in hospital room?: A Little Help needed climbing 3-5 steps with a railing? : A Lot 6 Click Score: 18    End of Session Equipment Utilized During Treatment: Gait belt Activity Tolerance: No increased pain;Patient tolerated treatment well Patient left: with family/visitor present;with chair alarm set;with call bell/phone within reach;in chair Nurse Communication: Mobility status PT Visit Diagnosis: Pain;Difficulty in  walking, not elsewhere classified (R26.2) Pain - Right/Left: Left Pain - part of body: Knee    Time: [id number] PT Time Calculation (min) (ACUTE ONLY): 46 min   Charges:   PT Evaluation $PT Eval Low Complexity: 1 Low        Coolidge Breeze, PT, DPT [facility] Department Office: [phone] Pager: [phone]  Coolidge Breeze 04/30/2022, 7:07 PM

## 2022-04-30 NOTE — Anesthesia Postprocedure Evaluation (Signed)
Anesthesia Post Note  Patient: MARWAN KISSELL  Procedure(s) Performed: LEFT TOTAL KNEE ARTHROPLASTY (Left: Knee)     Patient location during evaluation: PACU Anesthesia Type: General Level of consciousness: awake and alert Pain management: pain level controlled Vital Signs Assessment: post-procedure vital signs reviewed and stable Respiratory status: spontaneous breathing, nonlabored ventilation and respiratory function stable Cardiovascular status: blood pressure returned to baseline and stable Postop Assessment: no apparent nausea or vomiting Anesthetic complications: no   No notable events documented.  Last Vitals:  Vitals:   04/30/22 1415 04/30/22 1430  BP: (!) 153/110 (!) 157/108  Pulse: 89 90  Resp: 12 13  Temp: 36.6 C   SpO2: 99% 100%    Last Pain:  Vitals:   04/30/22 1430  TempSrc:   PainSc: 3                  Lidia Collum

## 2022-04-30 NOTE — Anesthesia Procedure Notes (Signed)
Procedure Name: LMA Insertion Date/Time: 04/30/2022 10:18 AM Performed by: Ponciano Ort, CRNA Pre-anesthesia Checklist: Patient identified, Emergency Drugs available, Suction available and Patient being monitored Patient Re-evaluated:Patient Re-evaluated prior to induction Oxygen Delivery Method: Circle system utilized Preoxygenation: Pre-oxygenation with 100% oxygen Induction Type: IV induction LMA: LMA inserted LMA Size: 5.0 Tube type: Oral Number of attempts: 1 Placement Confirmation: positive ETCO2 and breath sounds checked- equal and bilateral Tube secured with: Tape Dental Injury: Teeth and Oropharynx as per pre-operative assessment

## 2022-05-01 ENCOUNTER — Encounter (HOSPITAL_COMMUNITY): Payer: Self-pay | Admitting: Orthopedic Surgery

## 2022-05-01 DIAGNOSIS — M1712 Unilateral primary osteoarthritis, left knee: Secondary | ICD-10-CM | POA: Diagnosis not present

## 2022-05-01 NOTE — Progress Notes (Signed)
PATIENT ID: Nicole KindredJose S   MRN: [id number]  DOB/AGE:  12/29/1955 / 66 y.o.  1 Day Post-Op Procedure(s) (LRB): LEFT TOTAL KNEE ARTHROPLASTY (Left)    PROGRESS NOTE Subjective: Patient is alert, oriented, no Nausea, no Vomiting, yes passing gas. Taking PO well. Denies SOB, Chest or Calf Pain. Using Incentive Spirometer, PAS in place. Ambulate WBAT, Patient reports pain as 2/10 .    Objective: Vital signs in last 24 hours: Vitals:   04/30/22 2129 04/30/22 2235 05/01/22 0147 05/01/22 0502  BP: (!) 142/96  (!) 127/91 131/86  Pulse: 90 89 88 92  Resp: 18 20 16 18   Temp:    98.2 F (36.8 C)  TempSrc:   Axillary Oral  SpO2: 94% 95% 92% 96%  Weight:      Height:          Intake/Output from previous day: I/O last 3 completed shifts: In: 3618 [P.O.:1080; I.V.:2238; IV Piggyback:300] Out: 1570 [Urine:1370; Blood:200]   Intake/Output this shift: No intake/output data recorded.   LABORATORY DATA: No results for input(s): WBC, HGB, HCT, PLT, NA, K, CL, CO2, BUN, CREATININE, GLUCOSE, GLUCAP, INR, CALCIUM in the last 72 hours.  Invalid input(s): PT, 2  Examination: Neurologically intact ABD soft Neurovascular intact Sensation intact distally Intact pulses distally Dorsiflexion/Plantar flexion intact Incision: dressing C/D/I No cellulitis present Compartment soft} ROM 5-95, can do SLR supine  Assessment:   1 Day Post-Op Procedure(s) (LRB): LEFT TOTAL KNEE ARTHROPLASTY (Left) ADDITIONAL DIAGNOSIS: Expected Acute Blood Loss Anemia, Cardiac Arrythmia hx V tach, CHF, Obesity class 2, AA  Patient's anticipated LOS is less than 2 midnights, meeting these requirements: - Younger than 1365 - Lives within 1 hour of care - Has a competent adult at home to recover with post-op recover - NO history of  - Chronic pain requiring opiods  - Diabetes  - Coronary Artery Disease  - Heart failure  - Heart attack  - Stroke  - DVT/VTE  - Cardiac arrhythmia  - Respiratory Failure/COPD  -  Renal failure  - Anemia  - Advanced Liver disease     Plan: PT/OT WBAT, AROM and PROM  DVT Prophylaxis:  SCDx72hrs, Eliquis 2.5mg  PO mg BID x 2 weeks, then resume 5mg  PO BID DISCHARGE PLAN: Home, if passes PT today DISCHARGE NEEDS: HHPT, Walker, and 3-in-1 comode seat     Nestor LewandowskyFrank J Hidaya Daniel 05/01/2022, 7:51 AM  Patient ID: Nicole KindredJose S , male   DOB: 03/10/1956, 66 y.o.   MRN: [id number]

## 2022-05-01 NOTE — Progress Notes (Signed)
Patient discharged to home w/ family. Given all belongings, instructions, equipment. Verbalized understanding of instructions. Escorted to pov via w/c. 

## 2022-05-01 NOTE — Discharge Summary (Signed)
Patient ID: Ryan Glass MRN: [id number] DOB/AGE: Apr 04, 1956 66 y.o.  Admit date: 04/30/2022 Discharge date: 05/01/2022  Admission Diagnoses:  Principal Problem:   Degenerative arthritis of left knee Active Problems:   S/P total knee arthroplasty, left   Discharge Diagnoses:  Same  Past Medical History:  Diagnosis Date   AICD (automatic cardioverter/defibrillator) present    Ascending aortic aneurysm ([place])    a. CT 05/2021 showed 4.4x4.4cm TAA. Stable on chest CTA 12/2021 at 4.4cm.   Atrial fibrillation (HCC)    Bilateral lower extremity edema    Celiac artery dilatation (HCC)    1.8cm at the celiac trunk on Chest CT 12/2021   CHF (congestive heart failure) (HCC)    Chronic gout without tophus    followed by dr Gerilyn Nestle    (06-08-2020  per pt last episode right knee 3 wks ago)   CKD (chronic kidney disease), stage II    nephrology--- Jen Mow PA (10-29-2019 note in epic scanned in  media)   Coronary artery disease    Heart failure with mid-range ejection fraction ([place]) 05/27/2021   05/26/21: Left ventricular ejection fraction, by estimation, is 45 to 50%. There is severe asymmetric left ventricular hypertrophy. G1DD present.    History of COVID-19 06/07/2021   HOCM (hypertrophic obstructive cardiomyopathy) ([place])    s/p ICD 07/2021   Hypertension    followed by cardiology, dr t. turner   (05-14-2018 nuclear study in epic , normal perfusion with nuclear ef 61%)   Left hydrocele    Low serum potassium level 04/27/2014   OSA on CPAP    per pt uses every night   Palpitations followed by dr t. [name]   06-08-2020  still feels palipations due to PVCs when exertion but not with chest pain/ discomfort   Pneumonia due to COVID-19 virus    PVC's (premature ventricular contractions) cardiologist--- dr t. turner   Status post PVC ablation by [name] 2019 with recurrence of frequent PVCs/bigeminy;  prior pseudobradycardia r/t pvcs   Rheumatoid arthritis involving multiple  sites [facility])    rheumotology--- dr a. Gerilyn Nestle  ([facility] in [place])    Surgeries: Procedure(s): LEFT TOTAL KNEE ARTHROPLASTY on 04/30/2022   Consultants:   Discharged Condition: Improved  Hospital Course: Ryan Glass is an 66 y.o. male who was admitted 04/30/2022 for operative treatment ofDegenerative arthritis of left knee. Patient has severe unremitting pain that affects sleep, daily activities, and work/hobbies. After pre-op clearance the patient was taken to the operating room on 04/30/2022 and underwent  Procedure(s): LEFT TOTAL KNEE ARTHROPLASTY.    Patient was given perioperative antibiotics:  Anti-infectives (From admission, onward)    Start     Dose/Rate Route Frequency Ordered Stop   04/30/22 0915  ceFAZolin (ANCEF) IVPB 2g/100 mL premix        2 g 200 mL/hr over 30 Minutes Intravenous On call to O.R. 04/30/22 0901 04/30/22 1049        Patient was given sequential compression devices, early ambulation, and chemoprophylaxis to prevent DVT.  Patient benefited maximally from hospital stay and there were no complications.    Recent vital signs: Patient Vitals for the past 24 hrs:  BP Temp Temp src Pulse Resp SpO2 Height Weight  05/01/22 0502 131/86 98.2 F (36.8 C) Oral 92 18 96 % -- --  05/01/22 0147 (!) 127/91 -- Axillary 88 16 92 % -- --  04/30/22 2235 -- -- -- 89 20 95 % -- --  04/30/22 2129 (!) 142/96 -- --  90 18 94 % -- --  04/30/22 1814 (!) 156/107 98 F (36.7 C) Oral 90 16 95 % -- --  04/30/22 1730 (!) 148/98 98 F (36.7 C) Oral 89 20 99 % -- --  04/30/22 1538 (!) 173/109 97.9 F (36.6 C) Oral 89 14 99 % -- --  04/30/22 1430 (!) 157/108 -- -- 90 13 100 % -- --  04/30/22 1415 (!) 153/110 97.9 F (36.6 C) -- 89 12 99 % -- --  04/30/22 1400 (!) 158/112 -- -- 90 16 98 % -- --  04/30/22 1345 (!) 155/109 -- -- 89 15 99 % -- --  04/30/22 1330 (!) 160/109 -- -- 90 13 97 % -- --  04/30/22 1300 (!) 152/97 -- -- 89 17 99 % -- --  04/30/22 1245 (!) 148/106 -- -- 90 18 98 %  -- --  04/30/22 1230 (!) 157/104 -- -- 89 17 98 % -- --  04/30/22 1222 (!) 145/107 97.7 F (36.5 C) -- 90 17 97 % -- --  04/30/22 0940 (!) 138/102 -- -- 89 18 95 % -- --  04/30/22 0936 -- -- -- -- -- -- 5\' 9"  (1.753 m) 109 kg  04/30/22 0935 (!) 143/88 -- -- 90 18 97 % -- --  04/30/22 0902 (!) 127/91 97.8 F (36.6 C) Oral 90 18 92 % -- --     Recent laboratory studies: No results for input(s): WBC, HGB, HCT, PLT, NA, K, CL, CO2, BUN, CREATININE, GLUCOSE, INR, CALCIUM in the last 72 hours.  Invalid input(s): PT, 2   Discharge Medications:   Allergies as of 05/01/2022       Reactions   Amlodipine Besylate Swelling, Other (See Comments)   Leg swelling with 10 mg daily am dosing; decreased with BID 5 mg dosing   Aspirin Itching        Medication List     STOP taking these medications    acetaminophen-codeine 300-30 MG tablet Commonly known as: TYLENOL #3       TAKE these medications    albuterol 108 (90 Base) MCG/ACT inhaler Commonly known as: VENTOLIN HFA Inhale 2 puffs into the lungs every 4 (four) hours as needed for wheezing or shortness of breath.   apixaban 2.5 MG Tabs tablet Commonly known as: Eliquis Take 1 tablet (2.5 mg total) by mouth 2 (two) times daily. What changed:  medication strength how much to take   atorvastatin 40 MG tablet Commonly known as: LIPITOR Take 1 tablet (40 mg total) by mouth daily.   Cholecalciferol 1.25 MG (50000 UT) Tabs Take 1 tablet by mouth once a week.   colchicine 0.6 MG tablet Take 0.6 mg by mouth daily.   fluticasone 50 MCG/ACT nasal spray Commonly known as: FLONASE Place 1 spray into both nostrils daily. What changed:  when to take this reasons to take this   fluticasone-salmeterol 250-50 MCG/ACT Aepb Commonly known as: ADVAIR Inhale 1 puff into the lungs 2 (two) times daily as needed (shortness of breath).   folic acid 1 MG tablet Commonly known as: FOLVITE Take 1 mg by mouth daily.   furosemide 40 MG  tablet Commonly known as: LASIX TAKE ONE TABLET BY MOUTH DAILY FOR THREE DAYS **THEN TAKE AS NEEDED FOR INCREASE WEIGHT GAIN** What changed: See the new instructions.   gabapentin 600 MG tablet Commonly known as: NEURONTIN Take 600 mg by mouth with breakfast, with lunch, and with evening meal.   hydrALAZINE 100 MG tablet Commonly known as:  APRESOLINE Take 100 mg by mouth 3 (three) times daily.   loratadine 10 MG tablet Commonly known as: CLARITIN Take 10 mg by mouth daily.   Magnesium Oxide 400 MG Caps Take 1 capsule (400 mg total) by mouth every other day. While taking daily multivitamin with 100mg  magnesium every day   montelukast 10 MG tablet Commonly known as: SINGULAIR Take 1 tablet (10 mg total) by mouth at bedtime.   nebivolol 5 MG tablet Commonly known as: Bystolic Take 1 tablet (5 mg total) by mouth daily.   Orencia ClickJect 0000000 MG/ML Soaj Generic drug: Abatacept Inject 125 mg into the skin once a week.   oxyCODONE-acetaminophen 5-325 MG tablet Commonly known as: PERCOCET/ROXICET Take 1 tablet by mouth every 4 (four) hours as needed for severe pain.   pantoprazole 40 MG tablet Commonly known as: PROTONIX Take 1 tablet (40 mg total) by mouth 2 (two) times daily.   potassium chloride SA 20 MEQ tablet Commonly known as: KLOR-CON M Take 1 tablet by  mouth only when you have to use a Lasix   sildenafil 20 MG tablet Commonly known as: REVATIO Take 20 mg by mouth daily as needed (erectile dysfunction).   sotalol 120 MG tablet Commonly known as: BETAPACE Take 1 tablet (120 mg total) by mouth every 12 (twelve) hours.   spironolactone 25 MG tablet Commonly known as: ALDACTONE Take 1 tablet (25 mg total) by mouth daily.   tiZANidine 2 MG tablet Commonly known as: ZANAFLEX Take 1 tablet (2 mg total) by mouth every 6 (six) hours as needed.               Durable Medical Equipment  (From admission, onward)           Start     Ordered   04/30/22  1512  DME Walker rolling  Once       Question:  Patient needs a walker to treat with the following condition  Answer:  Status post total left knee replacement   04/30/22 1511   04/30/22 1512  DME 3 n 1  Once        04/30/22 1511            Diagnostic Studies: No results found.  Disposition:      Follow-up Information     Frederik Pear, MD Follow up in 2 week(s).   Specialty: Orthopedic Surgery Contact information: [place] [address] [phone]                  Signed: Kerin Salen 05/01/2022, 8:06 AM

## 2022-05-01 NOTE — TOC Transition Note (Signed)
Transition of Care [facility]) - CM/SW Discharge Note   Patient Details  Name: TESEAN STUMP MRN: [id number] Date of Birth: 10-13-1956  [facility]) CM/SW Contact:  Lennart Pall, LCSW Phone Number: 05/01/2022, 10:13 AM   Clinical Narrative:    Met with pt and confirming need for rolling walker and HHPT arrangements.  Pt with no agency preference.  Referral placed with [employer] and rw delivered to room.  HHPT secured with [facility].  No further TOC needs.   Final next level of care: [place] Barriers to Discharge: No Barriers Identified   Patient Goals and CMS Choice Patient states their goals for this hospitalization and ongoing recovery are:: return home      Discharge Placement                       Discharge Plan and Services                DME Arranged: Walker rolling DME Agency: [employer] Date DME Agency Contacted: 05/01/22 Time DME Agency Contacted: [id number] Representative spoke with at DME Agency: [place]: PT [place]: [place] Date [place]: 05/01/22 Time [employer]: 61 Representative spoke with at [place]: [place] Interventions     Readmission Risk Interventions    10/12/2021    1:08 PM  Readmission Risk Prevention Plan  Transportation Screening Complete  Medication Review Press photographer) Complete  PCP or Specialist appointment within 3-5 days of discharge Complete  HRI or [place] Complete  SW Recovery Care/Counseling Consult Complete  [place] Not Applicable

## 2022-05-01 NOTE — Progress Notes (Signed)
Physical Therapy Treatment Patient Details Name: Ryan Glass MRN: [id number] DOB: 1956/11/28 Today's Date: 05/01/2022   History of Present Illness Pt is a 66yo male presenting s/p L-TKA on 04/30/22. PMH: AICD, AAA, afib, ICD implant 2022, CHF, gout, CKD2, CAD s/p cath, HTN, OSA on CPAP, RA, several hernia repairs.    PT Comments    POD # 1 pm session Assisted with amb an increased distance then practiced stairs.   General stair comments: pt required one crutch with using one LEFT rail at 75% VC's on proper sequencing and use of crutch.  Instructed crutch use with "stairs only". Reviewed remaining TKR TE's.  Addressed all mobility questions, discussed appropriate activity, educated on use of ICE.  Instructed/Educated on Black ICE wrap.  Pt ready for D/C to home.  Pt plans to return for TKR "other" knee.     Recommendations for follow up therapy are one component of a multi-disciplinary discharge planning process, led by the attending physician.  Recommendations may be updated based on patient status, additional functional criteria and insurance authorization.  Follow Up Recommendations  Home health PT     Assistance Recommended at Discharge Set up Supervision/Assistance  Patient can return home with the following A little help with walking and/or transfers;A little help with bathing/dressing/bathroom;Assistance with cooking/housework;Assist for transportation;Help with stairs or ramp for entrance   Equipment Recommendations  Rolling walker (2 wheels)    Recommendations for Other Services       Precautions / Restrictions Precautions Precautions: Fall Precaution Comments: instructed no pillow under knee Restrictions Weight Bearing Restrictions: No Other Position/Activity Restrictions: WBAT     Mobility  Bed Mobility               General bed mobility comments: OOB in recliner    Transfers Overall transfer level: Needs assistance Equipment used: Rolling walker (2  wheels) Transfers: Sit to/from Stand Sit to Stand: Supervision, Min guard           General transfer comment: 25% VC's on proper hand placement and safety with turns.    Ambulation/Gait Ambulation/Gait assistance: Min guard, Min assist Gait Distance (Feet): 45 Feet Assistive device: Rolling walker (2 wheels) Gait Pattern/deviations: Step-to pattern, Decreased stance time - left Gait velocity: decreased     General Gait Details: very slow but steady gait with 50% VC's on proper walker to self distance and safety with turns.   Stairs Stairs: Yes Stairs assistance: Supervision, Min guard Stair Management: One rail Left, Step to pattern, Forwards, With crutches Number of Stairs: 4 General stair comments: pt required one crutch with using one LEFT rail at 75% VC's on proper sequencing and use of crutch.  Instructed crutch use with "stairs only".   Wheelchair Mobility    Modified Rankin (Stroke Patients Only)       Balance                                            Cognition Arousal/Alertness: Awake/alert Behavior During Therapy: WFL for tasks assessed/performed Overall Cognitive Status: Within Functional Limits for tasks assessed                                 General Comments: AxO x 3 pleasant.  First of 2 TKR so had a lot questions.  Extra Instruction and Education offered.  Pt plans to come back for the "other" knee.        Exercises   Reviewed all TE's following handout   General Comments        Pertinent Vitals/Pain Pain Assessment Pain Assessment: 0-10 Pain Score: 7  Pain Location: L knee Pain Descriptors / Indicators: Operative site guarding, Guarding Pain Intervention(s): Monitored during session, Premedicated before session, Repositioned, Ice applied    Home Living                          Prior Function            PT Goals (current goals can now be found in the care plan section) Progress towards  PT goals: Progressing toward goals    Frequency    7X/week      PT Plan Current plan remains appropriate    Co-evaluation              AM-PAC PT "6 Clicks" Mobility   Outcome Measure  Help needed turning from your back to your side while in a flat bed without using bedrails?: A Little Help needed moving from lying on your back to sitting on the side of a flat bed without using bedrails?: A Little Help needed moving to and from a bed to a chair (including a wheelchair)?: A Little Help needed standing up from a chair using your arms (e.g., wheelchair or bedside chair)?: A Little Help needed to walk in hospital room?: A Little Help needed climbing 3-5 steps with a railing? : A Little 6 Click Score: 18    End of Session Equipment Utilized During Treatment: Gait belt Activity Tolerance: Patient tolerated treatment well Patient left: in chair;with call bell/phone within reach Nurse Communication: Mobility status PT Visit Diagnosis: Pain;Difficulty in walking, not elsewhere classified (R26.2) Pain - Right/Left: Left Pain - part of body: Knee     Time: 1340-1420 PT Time Calculation (min) (ACUTE ONLY): 40 min  Charges:  $Gait Training: 8-22 mins $Therapeutic Activity: 8-22 mins $Self Care/Home Management: 8-22                     {Xanthe Couillard  PTA Acute  Rehabilitation Services Pager      [phone] Office      [phone]

## 2022-05-01 NOTE — Progress Notes (Signed)
Orthopedic Tech Progress Note Patient Details:  Nicole KindredJose S  06/14/1956 [id number]  Ortho Devices Type of Ortho Device: Crutches Ortho Device/Splint Interventions: Ordered, Adjustment   Post Interventions Patient Tolerated: Well Instructions Provided: Care of device Crutches dropped off with PTA in patients room.  GrenadaBrittany A Gerilyn PilgrimSykes 05/01/2022, 2:10 PM

## 2022-05-01 NOTE — Progress Notes (Signed)
Physical Therapy Treatment Patient Details Name: Ryan Glass MRN: [id number] DOB: 1956-04-24 Today's Date: 05/01/2022   History of Present Illness Pt is a 66yo male presenting s/p L-TKA on 04/30/22. PMH: AICD, AAA, afib, ICD implant 2022, CHF, gout, CKD2, CAD s/p cath, HTN, OSA on CPAP, RA, several hernia repairs.    PT Comments    POD # 1 am session General Comments: AxO x 3 pleasant.  First of 2 TKR so had a lot questions.  Extra Instruction and Education offered.  Pt plans to come back for the "other" knee. Assisted with amb.  General transfer comment: 25% VC's on proper hand placement and safety with turns. General Gait Details: very slow but steady gait with 50% VC's on proper walker to self distance and safety with turns. Then returned to room to perform some TE's following HEP handout.  Instructed on proper tech, freq as well as use of ICE.   Pt will need another PT session to address stairs and complete HEP.   Recommendations for follow up therapy are one component of a multi-disciplinary discharge planning process, led by the attending physician.  Recommendations may be updated based on patient status, additional functional criteria and insurance authorization.  Follow Up Recommendations  Home health PT     Assistance Recommended at Discharge Set up Supervision/Assistance  Patient can return home with the following A little help with walking and/or transfers;A little help with bathing/dressing/bathroom;Assistance with cooking/housework;Assist for transportation;Help with stairs or ramp for entrance   Equipment Recommendations  Rolling walker (2 wheels)    Recommendations for Other Services       Precautions / Restrictions Precautions Precautions: Fall Precaution Comments: instructed no pillow under knee Restrictions Weight Bearing Restrictions: No Other Position/Activity Restrictions: WBAT     Mobility  Bed Mobility               General bed mobility  comments: OOB in recliner    Transfers Overall transfer level: Needs assistance Equipment used: Rolling walker (2 wheels) Transfers: Sit to/from Stand Sit to Stand: Supervision, Min guard           General transfer comment: 25% VC's on proper hand placement and safety with turns.    Ambulation/Gait Ambulation/Gait assistance: Min guard, Min assist Gait Distance (Feet): 25 Feet Assistive device: Rolling walker (2 wheels) Gait Pattern/deviations: Step-to pattern, Decreased stance time - left Gait velocity: decreased     General Gait Details: very slow but steady gait with 50% VC's on proper walker to self distance and safety with turns.   Stairs             Wheelchair Mobility    Modified Rankin (Stroke Patients Only)       Balance                                            Cognition Arousal/Alertness: Awake/alert Behavior During Therapy: WFL for tasks assessed/performed Overall Cognitive Status: Within Functional Limits for tasks assessed                                 General Comments: AxO x 3 pleasant.  First of 2 TKR so had a lot questions.  Extra Instruction and Education offered.  Pt plans to come back for the "other" knee.  Exercises  Total Knee Replacement TE's following HEP handout 10 reps B LE ankle pumps 05 reps towel squeezes 05 reps knee presses 05 reps heel slides  05 reps SAQ's 05 reps SLR's 05 reps ABD Educated on use of gait belt to assist with TE's Followed by ICE     General Comments        Pertinent Vitals/Pain Pain Assessment Pain Assessment: 0-10 Pain Score: 7  Pain Location: L knee Pain Descriptors / Indicators: Operative site guarding, Guarding Pain Intervention(s): Monitored during session, Premedicated before session, Repositioned, Ice applied    Home Living                          Prior Function            PT Goals (current goals can now be found in the  care plan section) Progress towards PT goals: Progressing toward goals    Frequency    7X/week      PT Plan Current plan remains appropriate    Co-evaluation              AM-PAC PT "6 Clicks" Mobility   Outcome Measure  Help needed turning from your back to your side while in a flat bed without using bedrails?: A Little Help needed moving from lying on your back to sitting on the side of a flat bed without using bedrails?: A Little Help needed moving to and from a bed to a chair (including a wheelchair)?: A Little Help needed standing up from a chair using your arms (e.g., wheelchair or bedside chair)?: A Little Help needed to walk in hospital room?: A Little Help needed climbing 3-5 steps with a railing? : A Little 6 Click Score: 18    End of Session Equipment Utilized During Treatment: Gait belt Activity Tolerance: Patient tolerated treatment well Patient left: in chair;with call bell/phone within reach Nurse Communication: Mobility status PT Visit Diagnosis: Pain;Difficulty in walking, not elsewhere classified (R26.2) Pain - Right/Left: Left Pain - part of body: Knee     Time: [id number] PT Time Calculation (min) (ACUTE ONLY): 32 min  Charges:  $Gait Training: 8-22 mins $Therapeutic Exercise: 8-22 mins                     Rica Koyanagi  PTA Acute  Rehabilitation Services Pager      [phone] Office      [phone]

## 2022-05-04 ENCOUNTER — Encounter (HOSPITAL_COMMUNITY): Payer: Self-pay

## 2022-05-04 ENCOUNTER — Emergency Department (HOSPITAL_COMMUNITY)
Admission: EM | Admit: 2022-05-04 | Discharge: 2022-05-04 | Disposition: A | Discharge status: Home / Self Care | Payer: No Typology Code available for payment source | Attending: Emergency Medicine | Admitting: Emergency Medicine

## 2022-05-04 DIAGNOSIS — N182 Chronic kidney disease, stage 2 (mild): Secondary | ICD-10-CM | POA: Diagnosis not present

## 2022-05-04 DIAGNOSIS — Z79899 Other long term (current) drug therapy: Secondary | ICD-10-CM | POA: Insufficient documentation

## 2022-05-04 DIAGNOSIS — Z7901 Long term (current) use of anticoagulants: Secondary | ICD-10-CM | POA: Insufficient documentation

## 2022-05-04 DIAGNOSIS — I130 Hypertensive heart and chronic kidney disease with heart failure and stage 1 through stage 4 chronic kidney disease, or unspecified chronic kidney disease: Secondary | ICD-10-CM | POA: Diagnosis not present

## 2022-05-04 DIAGNOSIS — I509 Heart failure, unspecified: Secondary | ICD-10-CM | POA: Insufficient documentation

## 2022-05-04 DIAGNOSIS — M109 Gout, unspecified: Secondary | ICD-10-CM | POA: Diagnosis not present

## 2022-05-04 DIAGNOSIS — Z96652 Presence of left artificial knee joint: Secondary | ICD-10-CM | POA: Insufficient documentation

## 2022-05-04 DIAGNOSIS — I2510 Atherosclerotic heart disease of native coronary artery without angina pectoris: Secondary | ICD-10-CM | POA: Diagnosis not present

## 2022-05-04 DIAGNOSIS — M7989 Other specified soft tissue disorders: Secondary | ICD-10-CM | POA: Diagnosis present

## 2022-05-04 LAB — CBC WITH DIFFERENTIAL/PLATELET
Abs Immature Granulocytes: 0.08 10*3/uL — ABNORMAL HIGH (ref 0.00–0.07)
Basophils Absolute: 0 10*3/uL (ref 0.0–0.1)
Basophils Relative: 0 %
Eosinophils Absolute: 0 10*3/uL (ref 0.0–0.5)
Eosinophils Relative: 0 %
HCT: 36.9 % — ABNORMAL LOW (ref 39.0–52.0)
Hemoglobin: 12.1 g/dL — ABNORMAL LOW (ref 13.0–17.0)
Immature Granulocytes: 1 %
Lymphocytes Relative: 10 %
Lymphs Abs: 0.8 10*3/uL (ref 0.7–4.0)
MCH: 31.6 pg (ref 26.0–34.0)
MCHC: 32.8 g/dL (ref 30.0–36.0)
MCV: 96.3 fL (ref 80.0–100.0)
Monocytes Absolute: 0.6 10*3/uL (ref 0.1–1.0)
Monocytes Relative: 8 %
Neutro Abs: 7 10*3/uL (ref 1.7–7.7)
Neutrophils Relative %: 81 %
Platelets: 167 10*3/uL (ref 150–400)
RBC: 3.83 MIL/uL — ABNORMAL LOW (ref 4.22–5.81)
RDW: 12.9 % (ref 11.5–15.5)
WBC: 8.5 10*3/uL (ref 4.0–10.5)
nRBC: 0 % (ref 0.0–0.2)

## 2022-05-04 LAB — COMPREHENSIVE METABOLIC PANEL
ALT: 21 U/L (ref 0–44)
AST: 22 U/L (ref 15–41)
Albumin: 3.5 g/dL (ref 3.5–5.0)
Alkaline Phosphatase: 62 U/L (ref 38–126)
Anion gap: 8 (ref 5–15)
BUN: 24 mg/dL — ABNORMAL HIGH (ref 8–23)
CO2: 23 mmol/L (ref 22–32)
Calcium: 9.1 mg/dL (ref 8.9–10.3)
Chloride: 106 mmol/L (ref 98–111)
Creatinine, Ser: 1.25 mg/dL — ABNORMAL HIGH (ref 0.61–1.24)
GFR, Estimated: >60 mL/min (ref >60)
Glucose, Bld: 107 mg/dL — ABNORMAL HIGH (ref 70–99)
Potassium: 4.1 mmol/L (ref 3.5–5.1)
Sodium: 137 mmol/L (ref 135–145)
Total Bilirubin: 1 mg/dL (ref 0.3–1.2)
Total Protein: 6.7 g/dL (ref 6.5–8.1)

## 2022-05-04 MED ORDER — HYDROMORPHONE HCL 1 MG/ML IJ SOLN
0.5000 mg | Freq: Once | INTRAMUSCULAR | Status: AC
  Administered 2022-05-04: 0.5 mg via INTRAVENOUS
  Filled 2022-05-04: qty 1

## 2022-05-04 MED ORDER — HYDROMORPHONE HCL 1 MG/ML IJ SOLN
1.0000 mg | Freq: Once | INTRAMUSCULAR | Status: AC
Start: 2022-05-04 — End: 2022-05-04
  Administered 2022-05-04: 1 mg via INTRAVENOUS
  Filled 2022-05-04: qty 1

## 2022-05-04 MED ORDER — COLCHICINE 0.6 MG PO TABS
0.6000 mg | ORAL_TABLET | Freq: Once | ORAL | Status: AC
  Administered 2022-05-04: 0.6 mg via ORAL
  Filled 2022-05-04: qty 1

## 2022-05-04 MED ORDER — POLYETHYLENE GLYCOL 3350 17 G PO PACK: 17.0000 g | PACK | Freq: Two times a day (BID) | ORAL | 0 refills | Status: DC

## 2022-05-04 MED ORDER — COLCHICINE 0.6 MG PO TABS: 0.6000 mg | ORAL_TABLET | Freq: Every day | ORAL | 0 refills | Status: DC | PRN

## 2022-05-04 MED ORDER — ONDANSETRON HCL 4 MG/2ML IJ SOLN
4.0000 mg | Freq: Once | INTRAMUSCULAR | Status: AC
  Administered 2022-05-04: 4 mg via INTRAVENOUS
  Filled 2022-05-04: qty 2

## 2022-05-04 MED ORDER — DOCUSATE SODIUM 100 MG PO CAPS: 100.0000 mg | ORAL_CAPSULE | Freq: Two times a day (BID) | ORAL | 0 refills | Status: DC

## 2022-05-04 MED ORDER — OXYCODONE-ACETAMINOPHEN 5-325 MG PO TABS: 2.0000 | ORAL_TABLET | Freq: Four times a day (QID) | ORAL | 0 refills | Status: AC | PRN

## 2022-05-04 NOTE — ED Provider Notes (Signed)
[place] DEPT Provider Note   CSN: [id number] Arrival date & time: 05/04/22  1317     History  Chief Complaint  Patient presents with   Knee Pain    LUXTON SALONEN is a 66 y.o. male.   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 w/ recurrence, PAF on eliquis, chronic right sided heart failure, h/o PE, mild CAD, HTN, Stage II CKD and RA on O[place] who is presenting today with lower ext pain and inability to walk.  Patient reports he had a knee replacement done by Dr. Mayer Camel on Monday of his left knee.  He always has some weakness of his right knee but was doing okay except this morning he woke up with severe pain swelling and heat to his right foot that he describes as his typical gout attack and now he is having so much pain he cannot walk at all.  He had tried taking the oxycodone 1 tablet at home without improvement of his symptoms.  He has not had fever nausea or vomiting.  He has had gout in the same area of his foot in the past.  In the past he has had colchicine which improves his symptoms.  He does not notice new redness or worsening swelling of his left knee and denies any trauma.  He reports the pain in his foot just started this morning.  The history is provided by the patient and medical records.  Knee Pain     Home Medications Prior to Admission medications   Medication Sig Start Date End Date Taking? Authorizing Provider  Abatacept (ORENCIA CLICKJECT) 0000000 MG/ML SOAJ Inject 125 mg into the skin once a week. 11/08/21   [provider]  albuterol (VENTOLIN HFA) 108 (90 Base) MCG/ACT inhaler Inhale 2 puffs into the lungs every 4 (four) hours as needed for wheezing or shortness of breath. 03/02/21   Betancourt, Aura Fey, NP  apixaban (ELIQUIS) 2.5 MG TABS tablet Take 1 tablet (2.5 mg total) by mouth 2 (two) times daily. 04/30/22   Leighton Parody, PA-C  atorvastatin (LIPITOR) 40 MG tablet Take 1 tablet (40 mg total) by  mouth daily. 02/15/22   Betancourt, Aura Fey, NP  Cholecalciferol 1.25 MG (50000 UT) TABS Take 1 tablet by mouth once a week. 03/25/22 06/23/22  Betancourt, Aura Fey, NP  colchicine 0.6 MG tablet Take 0.6 mg by mouth daily. 01/01/22   [provider]  fluticasone (FLONASE) 50 MCG/ACT nasal spray Place 1 spray into both nostrils daily. Patient taking differently: Place 1 spray into both nostrils daily as needed for allergies. 06/08/21   Lilland, Alana, DO  fluticasone-salmeterol (ADVAIR) 250-50 MCG/ACT AEPB Inhale 1 puff into the lungs 2 (two) times daily as needed (shortness of breath). 03/26/21   [provider]  folic acid (FOLVITE) 1 MG tablet Take 1 mg by mouth daily. 01/29/20   [provider]  furosemide (LASIX) 40 MG tablet TAKE ONE TABLET BY MOUTH DAILY FOR THREE DAYS **THEN TAKE AS NEEDED FOR INCREASE WEIGHT GAIN** 04/30/22   Sueanne Margarita, MD  gabapentin (NEURONTIN) 600 MG tablet Take 600 mg by mouth with breakfast, with lunch, and with evening meal. 02/04/19   [provider]  hydrALAZINE (APRESOLINE) 100 MG tablet Take 100 mg by mouth 3 (three) times daily. 11/29/21   [provider]  loratadine (CLARITIN) 10 MG tablet Take 10 mg by mouth daily. 08/11/20   [provider]  Magnesium Oxide 400 MG CAPS Take 1 capsule (400 mg total) by mouth every other day. While taking daily multivitamin with 100mg  magnesium every day 12/19/21   Betancourt, Aura Fey, NP  montelukast (SINGULAIR) 10 MG tablet Take 1 tablet (10 mg total) by mouth at bedtime. 09/07/21   Betancourt, Aura Fey, NP  nebivolol (BYSTOLIC) 5 MG tablet Take 1 tablet (5 mg total) by mouth daily. 01/26/22   Sueanne Margarita, MD  oxyCODONE-acetaminophen (PERCOCET/ROXICET) 5-325 MG tablet Take 1 tablet by mouth every 4 (four) hours as needed for severe pain. 04/30/22   Leighton Parody, PA-C  pantoprazole (PROTONIX) 40 MG tablet Take 1 tablet (40 mg total) by mouth 2 (two) times daily. Patient not taking:  Reported on 04/25/2022 10/12/21   Nicole Kindred A, DO  potassium chloride SA (KLOR-CON M) 20 MEQ tablet Take 1 tablet by  mouth only when you have to use a Lasix 01/26/22   Sueanne Margarita, MD  sildenafil (REVATIO) 20 MG tablet Take 20 mg by mouth daily as needed (erectile dysfunction). 08/03/20   [provider]  sotalol (BETAPACE) 120 MG tablet Take 1 tablet (120 mg total) by mouth every 12 (twelve) hours. 04/19/22   Shirley Friar, PA-C  spironolactone (ALDACTONE) 25 MG tablet Take 1 tablet (25 mg total) by mouth daily. 09/13/21   Shirley Friar, PA-C  tiZANidine (ZANAFLEX) 2 MG tablet Take 1 tablet (2 mg total) by mouth every 6 (six) hours as needed. 04/30/22   Leighton Parody, PA-C      Allergies    Amlodipine besylate and Aspirin    Review of Systems   Review of Systems  Physical Exam Updated Vital Signs BP 125/75   Pulse 90   Temp 99 F (37.2 C) (Oral)   Resp 18   SpO2 97%  Physical Exam Vitals and nursing note reviewed.  Constitutional:      General: He is not in acute distress.    Appearance: He is well-developed.  HENT:     Head: Normocephalic and atraumatic.  Eyes:     Conjunctiva/sclera: Conjunctivae normal.     Pupils: Pupils are equal, round, and reactive to light.  Cardiovascular:     Rate and Rhythm: Normal rate.     Pulses: Normal pulses.     Heart sounds: No murmur heard. Pulmonary:     Effort: Pulmonary effort is normal. No respiratory distress.  Musculoskeletal:        General: Tenderness present. Normal range of motion.     Cervical back: Normal range of motion and neck supple.       Legs:  Skin:    General: Skin is warm and dry.     Findings: No erythema or rash.  Neurological:     Mental Status: He is alert and oriented to person, place, and time.  Psychiatric:        Behavior: Behavior normal.    ED Results / Procedures / Treatments   Labs (all labs ordered are listed, but only abnormal results are displayed) Labs  Reviewed  CBC WITH DIFFERENTIAL/PLATELET - Abnormal; Notable for the following components:      Result Value   RBC 3.83 (*)    Hemoglobin 12.1 (*)    HCT 36.9 (*)    Abs Immature Granulocytes 0.08 (*)    All other components within normal limits  COMPREHENSIVE METABOLIC PANEL - Abnormal; Notable for the following components:   Glucose, Bld 107 (*)    BUN  24 (*)    Creatinine, Ser 1.25 (*)    All other components within normal limits    EKG None  Radiology No results found.  Procedures Procedures    Medications Ordered in ED Medications  HYDROmorphone (DILAUDID) injection 1 mg (has no administration in time range)  ondansetron (ZOFRAN) injection 4 mg (has no administration in time range)    ED Course/ Medical Decision Making/ A&P                           Medical Decision Making Amount and/or Complexity of Data Reviewed Labs: ordered.  Risk Prescription drug management.   Patient is a 66 year old male with multiple medical problems presenting today with inability to walk due to severe pain in the legs.  Patient had a knee replacement done on Monday and has been having ongoing pain since the knee replacement but has been able to get around until when he woke up this morning he started having severe pain in the opposite leg and the right foot area with some erythema and swelling.  He has had gout before and he reports this feels just like his prior gout attacks.  On exam patient's right mid foot up into the ankle is erythematous warm and swollen consistent with gout.  Knee replacement site looks as expected on post surgical day 3.  Patient has had colchicine in the past which has helped with his gout.  Because that he now has pain in both legs he is unable to move around or walk.  Will attempt pain control.  Will check labs to see if colchicine would be possible the patient may also just need prednisone.  He is on Eliquis and should not take NSAIDs.  Low suspicion for DVT as  patient is anticoagulated. NV intact.  3:20 PM I independently interpreted patient's labs today and hemoglobin is stable at 12 with normal white count and platelet count, CMP with stable creatinine of 1.25 and normal blood sugar.  Patient given 1 dose of colchicine here.  After Dilaudid patient's pain improved but feels like he needs a little more.          Final Clinical Impression(s) / ED Diagnoses Final diagnoses:  None    Rx / DC Orders ED Discharge Orders     None         Blanchie Dessert, MD 05/04/22 1714

## 2022-05-04 NOTE — ED Provider Notes (Signed)
Patient seen by Dr. Anitra Lauth.  Please see her note.  Patient requested an additional Rx of Percocet.  Plan was to have him increase his medication to 2 tablets instead of just the one and he states he was going to run out.  Patient encouraged to follow-up with his orthopedic doctor.  I will also give him a prescription for laxative medications as he states he has not had a good bowel movement since his knee surgery.   Linwood Dibbles, MD 05/04/22 [phone]

## 2022-05-04 NOTE — ED Notes (Signed)
Pt states understanding of dc instructions and follow up and prescriptions.  Pt assisted into wheelchair and private vehicle.  Pt denies questions or concerns upon dc. No belongings left in room upon dc.

## 2022-05-04 NOTE — Discharge Instructions (Addendum)
You can use the pain medication they prescribed you for pain but you can increase it to 2 tablets every 4 hours as needed.  Do not take more than 4 doses of colchicine.  Also increase your prednisone to 20 mg for the next 4 days to help with the swelling and pain

## 2022-05-04 NOTE — ED Triage Notes (Signed)
Pt arrived via EMS, from home, c/o left knee pain worsening after PT today. Recent knee replacement on Monday. Also c/o gout flare up to right ankle. States he took meds this morning.

## 2022-05-07 ENCOUNTER — Telehealth: Payer: Self-pay | Admitting: Registered Nurse

## 2022-05-07 ENCOUNTER — Encounter: Payer: Self-pay | Admitting: Registered Nurse

## 2022-05-07 DIAGNOSIS — M109 Gout, unspecified: Secondary | ICD-10-CM

## 2022-05-07 NOTE — Telephone Encounter (Signed)
Patient contacted via telephone after epic review.  Surgery completed.5/22 discharged 5/23 patient reported pain worsened 5/24  Gout right foot seen in ED 5/26 and restarted on colchicine and given oxycodone acetaminophen Rx.  Patient reported pain finally easing today but has noticed left foot gout symptoms now and right knee increased temp/redness.  He contacted orthopedics on call service and was instructed to call back again tomorrow if still red/hot.  Discussed with patient if fever/chills especially important to call his surgeon's office.  This knee redness could be gout or infection or inflammatory response after surgery.  Important to have re-evaluation by his surgeon's office.  If any pus/purulent discharge on bandages he should notify surgeon.  Patient stated next follow up appt Thursday 1 Jun with surgeon's office for dressing change.  Patient has left original surgical dressing in place as instructed.  Patient reported today having some weakness in legs on stairs.  Patient wondering why gout flare up as he is watching his diet.  Discussed with patient if dehydrated could precipitate gout attack.  Patient stated he hasn't been drinking a lot of water due to pain with ambulation.  More water he drinks the more he has to get up to walk to bathroom.  Stated he would increase water intake.  Patient thinking he is having more pain than others he has talked to after knee replacement.  Wondering if due to age as he is older than most of the others he has spoken with.    Reviewed labs in Epic with patient normal liver function tests, potassium, sodium, glucose nonfasting, egfr, WBCs.  Latest Reference Range & Units 05/04/22 13:41  Sodium 135 - 145 mmol/L 137  Potassium 3.5 - 5.1 mmol/L 4.1  Chloride 98 - 111 mmol/L 106  CO2 22 - 32 mmol/L 23  Glucose 70 - 99 mg/dL 107 (H)  BUN 8 - 23 mg/dL 24 (H)  Creatinine 0.61 - 1.24 mg/dL 1.25 (H)  Calcium 8.9 - 10.3 mg/dL 9.1  Anion gap 5 - 15  8  Alkaline  Phosphatase 38 - 126 U/L 62  Albumin 3.5 - 5.0 g/dL 3.5  AST 15 - 41 U/L 22  ALT 0 - 44 U/L 21  Total Protein 6.5 - 8.1 g/dL 6.7  Total Bilirubin 0.3 - 1.2 mg/dL 1.0  GFR, Estimated >60 mL/min >60  WBC 4.0 - 10.5 K/uL 8.5  RBC 4.22 - 5.81 MIL/uL 3.83 (L)  Hemoglobin 13.0 - 17.0 g/dL 12.1 (L)  HCT 39.0 - 52.0 % 36.9 (L)  MCV 80.0 - 100.0 fL 96.3  MCH 26.0 - 34.0 pg 31.6  MCHC 30.0 - 36.0 g/dL 32.8  RDW 11.5 - 15.5 % 12.9  Platelets 150 - 400 K/uL 167  nRBC 0.0 - 0.2 % 0.0  Neutrophils % 81  Lymphocytes % 10  Monocytes Relative % 8  Eosinophil % 0  Basophil % 0  Immature Granulocytes % 1  NEUT# 1.7 - 7.7 K/uL 7.0  Lymphocyte # 0.7 - 4.0 K/uL 0.8  Monocyte # 0.1 - 1.0 K/uL 0.6  Eosinophils Absolute 0.0 - 0.5 K/uL 0.0  Basophils Absolute 0.0 - 0.1 K/uL 0.0  Abs Immature Granulocytes 0.00 - 0.07 K/uL 0.08 (H)  (H): Data is abnormally high (L): Data is abnormally low Discussed creatinine slightly increased and RBC/Hgb/Hct decreased from 04/27/22 after surgery  Discussed some blood loss during surgery.     Attempted to verify with patient if he has sufficient supply potassium to last while out of work/going to  rehab for knee replacement.  Patient thinks 1 small bottle remaining.  Will check in am when he takes his medications.  If less than 30 day supply he is to contact [employer] or I tomorrow Tuesday 5/30.  Discussed with patient both of [place] in clinic tomorrow but then I will be on vacation Wed-Fri.  RN [name] in clinic Tu, Th and Fr this week.  Patient verbalized understanding information/instructions, agreed with plan of care and had no further questions at this time.

## 2022-05-07 NOTE — Telephone Encounter (Signed)
See tcon dated 04/19/2022.  See photos from patient today left knee and left foot.  Instructed patient to follow up with surgeon's office again tomorrow.  He left message with surgeon answering service today.  Denied fever/chills today.

## 2022-05-09 ENCOUNTER — Ambulatory Visit (INDEPENDENT_AMBULATORY_CARE_PROVIDER_SITE_OTHER): Payer: No Typology Code available for payment source

## 2022-05-09 DIAGNOSIS — I421 Obstructive hypertrophic cardiomyopathy: Secondary | ICD-10-CM

## 2022-05-10 ENCOUNTER — Other Ambulatory Visit: Payer: Self-pay | Admitting: Orthopedic Surgery

## 2022-05-10 LAB — CUP PACEART REMOTE DEVICE CHECK
Battery Remaining Longevity: 73 mo
Battery Remaining Percentage: 86 %
Battery Voltage: 2.99 V
Brady Statistic AP VP Percent: 73 %
Brady Statistic AP VS Percent: 26 %
Brady Statistic AS VP Percent: 1 %
Brady Statistic AS VS Percent: 1 %
Brady Statistic RA Percent Paced: 99 %
Brady Statistic RV Percent Paced: 73 %
Date Time Interrogation Session: 20230531030437
HighPow Impedance: 64 Ohm
Implantable Lead Implant Date: 20220829
Implantable Lead Implant Date: 20220829
Implantable Lead Location: 753859
Implantable Lead Location: 753860
Implantable Pulse Generator Implant Date: 20220829
Lead Channel Impedance Value: 430 Ohm
Lead Channel Impedance Value: 430 Ohm
Lead Channel Pacing Threshold Amplitude: 0.75 V
Lead Channel Pacing Threshold Amplitude: 0.75 V
Lead Channel Pacing Threshold Pulse Width: 0.5 ms
Lead Channel Pacing Threshold Pulse Width: 0.5 ms
Lead Channel Sensing Intrinsic Amplitude: 12 mV
Lead Channel Sensing Intrinsic Amplitude: 3.6 mV
Lead Channel Setting Pacing Amplitude: 2 V
Lead Channel Setting Pacing Amplitude: 2.5 V
Lead Channel Setting Pacing Pulse Width: 0.5 ms
Lead Channel Setting Sensing Sensitivity: 0.5 mV
Pulse Gen Serial Number: 810029878

## 2022-05-11 ENCOUNTER — Other Ambulatory Visit: Payer: Self-pay | Admitting: Student

## 2022-05-11 ENCOUNTER — Telehealth: Payer: Self-pay | Admitting: *Deleted

## 2022-05-11 NOTE — Telephone Encounter (Signed)
   Pre-operative Risk Assessment    Patient Name: Ryan Glass  DOB: 10/27/1956 MRN: [id number]      Request for Surgical Clearance    Procedure:   RIGHT KNEE ARTHROPLASTY  Date of Surgery:  Clearance 06/18/22                                 Surgeon:  DR. Gean Birchwood Surgeon's Group or Practice Name:  [facility] Phone number:  [phone] Fax number:  [phone] ATTN: REBECCA LANG   Type of Clearance Requested:   - Medical  - Pharmacy:  Hold Apixaban (Eliquis)     Type of Anesthesia:  Spinal   Additional requests/questions:    Ryan Glass   05/11/2022, 2:39 PM

## 2022-05-11 NOTE — Telephone Encounter (Signed)
Please advise on hold time for Eliquis regarding patient's upcoming right knee arthroplasty on 06/18/2022.  -Patient will need telephone visit following Pharm.D. recommendations.  Thank you, Robin SearingErnest Amillia Biffle, NP

## 2022-05-14 NOTE — Telephone Encounter (Signed)
Patient reported he has enough potassium and does not need fill from [facility] spoke with patient via telephone 14 May 2022.

## 2022-05-14 NOTE — Telephone Encounter (Signed)
Please inform the patient to hold Eliquis for 3 days prior to the upcoming surgery and restart as soon as possible afterward.

## 2022-05-14 NOTE — Telephone Encounter (Signed)
Spoke with patient via telephone.  Reviewed epic prior to call.  Noted patient reported dark stools last week.  Patient reported bilateral feet/knee pain has improved and redness in feet/pain/swelling improved.  Has been doing home PT and provider came to house last week.  Today his first appt at outpatient PT this afternoon.  Patient reported gout flare almost resolved and surgeon's office thinks may have been related to surgery.  Patient had drank tart cherry juice last week and thinks changes in stool related to cherry juice as when he stopped symptom of dark stool resolved also.  "I actually never saw blood in my stool"  Had decreased his strong pain medication from 4 times per day to twice a day this week and doing well.  Hasn't taken yet this am as holding dose until right before his outpatient PT appt and bedtime tonight.  Slept well last night for first time since surgery and only woke up once around 0400.  Had increased water intake last week and was taking his gout medication TID with meals but this week decreased back to once a day.  Next total knee surgery for other leg scheduled 10 Jul per epic.  Patient reported has follow up with PCM this Friday Dr Cephus Richer also.  He is going to ask his provider if potassium and uric acid can be checked.  Discussed with patient I would review Epic and send to him last results on file after call as epic disconnected during call and I had to log in again.  Patient reported walked to his mailbox with crutches today and has been doing home exercises per his handouts also.  Still having some swelling surgical knee.  His surgeon's PA unsure if true gout flare surgery foot or just inflammation post surgery/blood settling into dependent position at ankle/foot.  Patient A&Ox3 spoke full sentences without difficulty.  No audible cough/shortness of breath.  Patient optimistic regarding recovery and next surgery.  Sleep, pain and mobility improved.  Will follow up with patient in  one week via telephone.  Patient verbalized understanding information/instructions, agreed with plan of care and had no further questions at this time.

## 2022-05-14 NOTE — Telephone Encounter (Signed)
    Patient Name: Ryan Glass  DOB: [birth year] MRN: [id number]  Primary Cardiologist: [name], MD  Chart reviewed as part of pre-operative protocol coverage. Given past medical history and time since last visit, based on ACC/AHA guidelines, Ryan Glass would be at acceptable risk for the planned procedure without further cardiovascular testing.   Patient was previously seen by Dr. Gala Romney in April and the cleared for left total knee surgery, now he require right total knee surgery.  He may hold Eliquis for 3 days prior to the procedure and restart as soon as possible afterward.  The patient was advised that if he develops new symptoms prior to surgery to contact our office to arrange for a follow-up visit, and he verbalized understanding.  I will route this recommendation to the requesting party via Epic fax function and remove from pre-op pool.  Please call with questions.  Azalee Course, [place] 05/14/2022, 7:35 PM

## 2022-05-14 NOTE — Telephone Encounter (Signed)
Patient with diagnosis of afib and PE on Eliquis for anticoagulation.    Procedure: right TKA Date of procedure: 06/18/22  CHA2DS2-VASc Score = 4  This indicates a 4.8% annual risk of stroke. The patient's score is based upon: CHF History: 1 HTN History: 1 Diabetes History: 0 Stroke History: 0 Vascular Disease History: 1 Age Score: 1 Gender Score: 0  Also with hx of possibly provoked PE 05/2021 after flight.  CrCl 25mL/min using adjusted body weight due to obesity Platelet count 167K  Per office protocol, patient can hold Eliquis for 3 days prior to procedure.

## 2022-05-15 NOTE — Telephone Encounter (Signed)
I have informed patient of recommendations on Eliquis per Azalee Course, PA. Pt verbalized understanding and thanked me for the call.

## 2022-05-22 ENCOUNTER — Telehealth: Payer: Self-pay | Admitting: Registered Nurse

## 2022-05-22 ENCOUNTER — Encounter: Payer: Self-pay | Admitting: Registered Nurse

## 2022-05-22 DIAGNOSIS — E7849 Other hyperlipidemia: Secondary | ICD-10-CM

## 2022-05-22 DIAGNOSIS — E876 Hypokalemia: Secondary | ICD-10-CM

## 2022-05-22 DIAGNOSIS — I25118 Atherosclerotic heart disease of native coronary artery with other forms of angina pectoris: Secondary | ICD-10-CM

## 2022-05-22 MED ORDER — ATORVASTATIN CALCIUM 40 MG PO TABS
40.0000 mg | ORAL_TABLET | Freq: Every day | ORAL | 0 refills | Status: DC
Start: 2022-05-22 — End: 2022-09-05

## 2022-05-22 NOTE — Telephone Encounter (Signed)
Filled from [facility] potassium chloride 20 meq po daily #90 RF0 and atorvastatin 40mg  po daily #90.  Patient asked that we give his medication to coworker Vernell Barrier as she is going to bring to him today still recuperating at home from total knee surgery and not back to work yet and she lives nearby.  Patient also requested bottle nasal saline sent to patient today from clinic stock.  Patient will need new Rx from [facility] for atorvastatin at next visit.  Was seen for preop visit last week and told to continue atorvastatin 40mg  po daily but no new Rx in chart.  Bridge refilled for patient today #90 atorvastatin 40mg  po daily   Latest Reference Range & Units 03/22/22 09:00  Total CHOL/HDL Ratio 0.0 - 5.0 ratio 2.9  Cholesterol, Total 100 - 199 mg/dL 703  HDL Cholesterol >50 mg/dL 49  Triglycerides 0 - 093 mg/dL 818  VLDL Cholesterol Cal 5 - 40 mg/dL 24  LDL Chol Calc (NIH) 0 - 99 mg/dL 71    Latest Reference Range & Units 05/04/22 13:41  Sodium 135 - 145 mmol/L 137  Potassium 3.5 - 5.1 mmol/L 4.1  Chloride 98 - 111 mmol/L 106  CO2 22 - 32 mmol/L 23  Glucose 70 - 99 mg/dL 299 (H)  BUN 8 - 23 mg/dL 24 (H)  Creatinine 3.71 - 1.24 mg/dL 6.96 (H)  Calcium 8.9 - 10.3 mg/dL 9.1  Anion gap 5 - 15  8  Alkaline Phosphatase 38 - 126 U/L 62  Albumin 3.5 - 5.0 g/dL 3.5  AST 15 - 41 U/L 22  ALT 0 - 44 U/L 21  Total Protein 6.5 - 8.1 g/dL 6.7  Total Bilirubin 0.3 - 1.2 mg/dL 1.0  GFR, Estimated >78 mL/min >60  (H): Data is abnormally high

## 2022-05-22 NOTE — Progress Notes (Signed)
Remote ICD transmission.   

## 2022-05-24 ENCOUNTER — Telehealth: Payer: Self-pay | Admitting: Primary Care

## 2022-05-24 NOTE — Telephone Encounter (Signed)
Fax received from Dr. Gean Birchwood with [facility] to perform a RIGHT knee arthroplasty on patient.  Patient needs surgery clearance. Patient was seen on 04/05/2022. He was also cleared at that time for the same surgery but on his LEFT knee. Office protocol is a risk assessment can be sent to surgeon if patient has been seen in 60 days or less.   Sending to [facility] for risk assessment or recommendations if patient needs to be seen in office prior to surgical procedure.

## 2022-05-25 NOTE — Telephone Encounter (Signed)
As long as he is not having any new or worsening pulmonary symptoms and has not been sick in the last 30 days. Patient is low-intermediate risk for prolonged mechanical ventilation and/or post op pulmonary complications from surgery d/t history of sleep apnea and reactive airways disease. Pulmonary function testing was normal in August 2022 and CXR was normal on 02/07/22. He is optimized for surgery from pulmonary standpoint, ultimate clearance will be decided by surgeon.   Recommend 1. Short duration of surgery as much as possible and avoid paralytic if possible 2. Recovery in step down or ICU with Pulmonary consultation if needed 3. DVT prophylaxis 4. Aggressive pulmonary toilet with o2, bronchodilatation, and incentive spirometry and early ambulation

## 2022-05-28 NOTE — Telephone Encounter (Signed)
Called and spoke with patient who states that he has been feeling good and is currently feeling good. Has no concerns. OV notes and clearance form have been faxed back to [facility]. Nothing further needed at this time.

## 2022-05-30 ENCOUNTER — Telehealth: Payer: Self-pay | Admitting: Registered Nurse

## 2022-05-30 DIAGNOSIS — M109 Gout, unspecified: Secondary | ICD-10-CM

## 2022-05-30 NOTE — Progress Notes (Signed)
DUE TO COVID-19 ONLY  2  VISITOR IS ALLOWED TO COME WITH YOU AND STAY IN THE WAITING ROOM ONLY DURING PRE OP AND PROCEDURE DAY OF SURGERY.  4  VISITOR  MAY VISIT WITH YOU AFTER SURGERY IN YOUR PRIVATE ROOM DURING VISITING HOURS ONLY! YOU MAY HAVE ONE PERSON SPEND THE NITE WITH YOU IN YOUR ROOM AFTER SURGERY.      Your procedure is scheduled on:           06/18/2022   Report to [facility] Main  Entrance   Report to admitting at      0730am            AM DO NOT BRING INSURANCE CARD, PICTURE ID OR WALLET DAY OF SURGERY.      Call this number if you have problems the morning of surgery [phone]    REMEMBER: NO  SOLID FOODS , CANDY, GUM OR MINTS AFTER MIDNITE THE NITE BEFORE SURGERY .       Marland Kitchen CLEAR LIQUIDS UNTIL      0700am           DAY OF SURGERY.      PLEASE FINISH ENSURE DRINK PER SURGEON ORDER  WHICH NEEDS TO BE COMPLETED AT   0700am         MORNING OF SURGERY.       CLEAR LIQUID DIET   Foods Allowed      WATER BLACK COFFEE ( SUGAR OK, NO MILK, CREAM OR CREAMER) REGULAR AND DECAF  TEA ( SUGAR OK NO MILK, CREAM, OR CREAMER) REGULAR AND DECAF  PLAIN JELLO ( NO RED)  FRUIT ICES ( NO RED, NO FRUIT PULP)  POPSICLES ( NO RED)  JUICE- APPLE, WHITE GRAPE AND WHITE CRANBERRY  SPORT DRINK LIKE GATORADE ( NO RED)  CLEAR BROTH ( VEGETABLE , CHICKEN OR BEEF)                                                                     BRUSH YOUR TEETH MORNING OF SURGERY AND RINSE YOUR MOUTH OUT, NO CHEWING GUM CANDY OR MINTS.     Take these medicines the morning of surgery with A SIP OF WATER:  inhalers as usual and bring, uloric, gabapentin, hydralazine, claritin, sotalol    DO NOT TAKE ANY DIABETIC MEDICATIONS DAY OF YOUR SURGERY                               You may not have any metal on your body including hair pins and              piercings  Do not wear jewelry, make-up, lotions, powders or perfumes, deodorant             Do not wear nail polish on your fingernails.               IF YOU ARE A MALE AND WANT TO SHAVE UNDER ARMS OR LEGS PRIOR TO SURGERY YOU MUST DO SO AT LEAST 48 HOURS PRIOR TO SURGERY.              Men may shave face and neck.   Do not bring valuables to the  hospital. [facility] IS NOT             RESPONSIBLE   FOR VALUABLES.  Contacts, dentures or bridgework may not be worn into surgery.  Leave suitcase in the car. After surgery it may be brought to your room.     Patients discharged the day of surgery will not be allowed to drive home. IF YOU ARE HAVING SURGERY AND GOING HOME THE SAME DAY, YOU MUST HAVE AN ADULT TO DRIVE YOU HOME AND BE WITH YOU FOR 24 HOURS. YOU MAY GO HOME BY TAXI OR UBER OR ORTHERWISE, BUT AN ADULT MUST ACCOMPANY YOU HOME AND STAY WITH YOU FOR 24 HOURS.                Please read over the following fact sheets you were given: _____________________________________________________________________  [facility] - Preparing for Surgery Before surgery, you can play an important role.  Because skin is not sterile, your skin needs to be as free of germs as possible.  You can reduce the number of germs on your skin by washing with CHG (chlorahexidine gluconate) soap before surgery.  CHG is an antiseptic cleaner which kills germs and bonds with the skin to continue killing germs even after washing. Please DO NOT use if you have an allergy to CHG or antibacterial soaps.  If your skin becomes reddened/irritated stop using the CHG and inform your nurse when you arrive at Short Stay. Do not shave (including legs and underarms) for at least 48 hours prior to the first CHG shower.  You may shave your face/neck. Please follow these instructions carefully:  1.  Shower with CHG Soap the night before surgery and the  morning of Surgery.  2.  If you choose to wash your hair, wash your hair first as usual with your  normal  shampoo.  3.  After you shampoo, rinse your hair and body thoroughly to remove the  shampoo.                           4.  Use  CHG as you would any other liquid soap.  You can apply chg directly  to the skin and wash                       Gently with a scrungie or clean washcloth.  5.  Apply the CHG Soap to your body ONLY FROM THE NECK DOWN.   Do not use on face/ open                           Wound or open sores. Avoid contact with eyes, ears mouth and genitals (private parts).                       Wash face,  Genitals (private parts) with your normal soap.             6.  Wash thoroughly, paying special attention to the area where your surgery  will be performed.  7.  Thoroughly rinse your body with warm water from the neck down.  8.  DO NOT shower/wash with your normal soap after using and rinsing off  the CHG Soap.                9.  Pat yourself dry with a clean towel.  10.  Wear clean pajamas.            11.  Place clean sheets on your bed the night of your first shower and do not  sleep with pets. Day of Surgery : Do not apply any lotions/deodorants the morning of surgery.  Please wear clean clothes to the hospital/surgery center.  FAILURE TO FOLLOW THESE INSTRUCTIONS MAY RESULT IN THE CANCELLATION OF YOUR SURGERY PATIENT SIGNATURE_________________________________  NURSE SIGNATURE__________________________________  ________________________________________________________________________

## 2022-05-30 NOTE — Telephone Encounter (Signed)
Patient reported gout flare again yesterday left foot and ankle. Reported it was Father's Day and his daughter's birthday diet got a bit off track.  Ate too many black beans (known previously to trigger gout).  Wife on call also reported had cake a couple days in a row and beef on Father's Day.  Has been taking colchicine and tart cherry.  Avoids NSAIDS due to history GI bleed 2022.  Discussed avoid dehydration, low purine diet.  Exitcare handouts on gout and low purine diet sent to my chart per wife request.  Patient reported looking forward to getting his other knee done next month surgery scheduled 10 Jul.  Saw [facility] 6/09 wanted to review lab results with me discussed still slightly anemic no infection on CBC platelets normal.  Patient is taking his iron supplement still.  Kidney/liver function, electrolytes and glucose normal.  Uric acid was elevated 8.1 previous normal 7.1.  Patient asked if PCM has note cleared for surgery.  Discussed no new updates in Epic today.  Noted cleared by pulmonology 05/24/22.  Patient reported has been walking, cooked chicken alfredo today.  Typically eating chicken or fish for protein.  His weight 237 lbs holding lasix unless over 240.bs.  Post op knee doing well per patient doing his exercises.  Patient denied other concerns or questions at this time.  Will follow up with patient after my vacation 23 Jun - 5 Jul.  Follow up with PCM/orthopedics as needed for gout/concerns.  Patient A&Ox3 spoke full sentences without difficulty no shortness of breath/wheezing/cough during 11 minute telephone call.  Patient verbalized understanding information/instructions, agreed with plan of care and had no further questions at this time.

## 2022-05-30 NOTE — Progress Notes (Signed)
Anesthesia Review:  PCP: DR Aguizr  Cardiologist : [name] Clearance in telephone encounter dated 05/11/22 by hao Meng,PA  DR Bensimhon- LOV 04/05/22  Pulm- Shana Chute - LOV 05/24/22  Chest x-ray : 02/07/22- 2view  EKG :04/05/22  02/07/22- Pulm Perfusion  Ct Angio chest- 12/13/21  05/10/22- devicew check  Echo : 02/15/22  Stress test: 02/14/22  Cardiac Cath :  Activity level:  Sleep Study/ CPAP : Fasting Blood Sugar :      / Checks Blood Sugar -- times a day:   Blood Thinner/ Instructions /Last Dose: ASA / Instructions/ Last Dose :   Eliquis

## 2022-06-05 ENCOUNTER — Encounter (HOSPITAL_COMMUNITY)
Admission: RE | Admit: 2022-06-05 | Discharge: 2022-06-05 | Disposition: A | Discharge status: Home / Self Care | Payer: No Typology Code available for payment source | Source: Ambulatory Visit | Attending: Orthopedic Surgery | Admitting: Orthopedic Surgery

## 2022-06-05 ENCOUNTER — Encounter (HOSPITAL_COMMUNITY): Payer: Self-pay

## 2022-06-05 ENCOUNTER — Encounter: Payer: Self-pay | Admitting: Internal Medicine

## 2022-06-05 ENCOUNTER — Other Ambulatory Visit: Payer: Self-pay

## 2022-06-05 VITALS — BP 139/107 | HR 90 | Temp 97.7°F | Resp 16 | Ht 68.0 in | Wt 231.0 lb

## 2022-06-05 DIAGNOSIS — I3139 Other pericardial effusion (noninflammatory): Secondary | ICD-10-CM | POA: Insufficient documentation

## 2022-06-05 DIAGNOSIS — I10 Essential (primary) hypertension: Secondary | ICD-10-CM | POA: Diagnosis not present

## 2022-06-05 DIAGNOSIS — I4891 Unspecified atrial fibrillation: Secondary | ICD-10-CM | POA: Diagnosis not present

## 2022-06-05 DIAGNOSIS — I2510 Atherosclerotic heart disease of native coronary artery without angina pectoris: Secondary | ICD-10-CM | POA: Diagnosis not present

## 2022-06-05 DIAGNOSIS — I129 Hypertensive chronic kidney disease with stage 1 through stage 4 chronic kidney disease, or unspecified chronic kidney disease: Secondary | ICD-10-CM | POA: Insufficient documentation

## 2022-06-05 DIAGNOSIS — M1711 Unilateral primary osteoarthritis, right knee: Secondary | ICD-10-CM | POA: Insufficient documentation

## 2022-06-05 DIAGNOSIS — Z9989 Dependence on other enabling machines and devices: Secondary | ICD-10-CM | POA: Diagnosis not present

## 2022-06-05 DIAGNOSIS — Z01818 Encounter for other preprocedural examination: Secondary | ICD-10-CM | POA: Diagnosis present

## 2022-06-05 DIAGNOSIS — G4733 Obstructive sleep apnea (adult) (pediatric): Secondary | ICD-10-CM | POA: Diagnosis not present

## 2022-06-05 DIAGNOSIS — N182 Chronic kidney disease, stage 2 (mild): Secondary | ICD-10-CM | POA: Insufficient documentation

## 2022-06-05 DIAGNOSIS — M069 Rheumatoid arthritis, unspecified: Secondary | ICD-10-CM | POA: Insufficient documentation

## 2022-06-05 DIAGNOSIS — I421 Obstructive hypertrophic cardiomyopathy: Secondary | ICD-10-CM | POA: Diagnosis not present

## 2022-06-05 DIAGNOSIS — Z95810 Presence of automatic (implantable) cardiac defibrillator: Secondary | ICD-10-CM | POA: Diagnosis not present

## 2022-06-05 DIAGNOSIS — Z7901 Long term (current) use of anticoagulants: Secondary | ICD-10-CM | POA: Diagnosis not present

## 2022-06-05 DIAGNOSIS — I7121 Aneurysm of the ascending aorta, without rupture: Secondary | ICD-10-CM | POA: Insufficient documentation

## 2022-06-05 LAB — CBC
HCT: 41 % (ref 39.0–52.0)
Hemoglobin: 13.6 g/dL (ref 13.0–17.0)
MCH: 31.3 pg (ref 26.0–34.0)
MCHC: 33.2 g/dL (ref 30.0–36.0)
MCV: 94.3 fL (ref 80.0–100.0)
Platelets: 195 10*3/uL (ref 150–400)
RBC: 4.35 MIL/uL (ref 4.22–5.81)
RDW: 13.3 % (ref 11.5–15.5)
WBC: 7 10*3/uL (ref 4.0–10.5)
nRBC: 0 % (ref 0.0–0.2)

## 2022-06-05 LAB — SURGICAL PCR SCREEN
MRSA, PCR: NEGATIVE
Staphylococcus aureus: NEGATIVE

## 2022-06-05 LAB — BASIC METABOLIC PANEL
Anion gap: 8 (ref 5–15)
BUN: 16 mg/dL (ref 8–23)
CO2: 21 mmol/L — ABNORMAL LOW (ref 22–32)
Calcium: 9.5 mg/dL (ref 8.9–10.3)
Chloride: 108 mmol/L (ref 98–111)
Creatinine, Ser: 1.25 mg/dL — ABNORMAL HIGH (ref 0.61–1.24)
GFR, Estimated: >60 mL/min (ref >60)
Glucose, Bld: 91 mg/dL (ref 70–99)
Potassium: 3.7 mmol/L (ref 3.5–5.1)
Sodium: 137 mmol/L (ref 135–145)

## 2022-06-05 LAB — TYPE AND SCREEN
ABO/RH(D): B NEG
Antibody Screen: NEGATIVE

## 2022-06-06 NOTE — Anesthesia Preprocedure Evaluation (Addendum)
Anesthesia Evaluation  Patient identified by MRN, date of birth, ID band Patient awake    Reviewed: Allergy & Precautions, NPO status , Patient's Chart, lab work & pertinent test results  Airway Mallampati: II  TM Distance: >3 FB     Dental   Pulmonary shortness of breath, asthma , sleep apnea , pneumonia,    breath sounds clear to auscultation       Cardiovascular hypertension, + CAD and +CHF  + Cardiac Defibrillator  Rhythm:Regular Rate:Normal     Neuro/Psych negative neurological ROS     GI/Hepatic negative GI ROS, Neg liver ROS,   Endo/Other    Renal/GU Renal disease     Musculoskeletal  (+) Arthritis ,   Abdominal   Peds  Hematology   Anesthesia Other Findings   Reproductive/Obstetrics                         Anesthesia Physical Anesthesia Plan  ASA: 3  Anesthesia Plan: General   Post-op Pain Management: Regional block*   Induction: Intravenous  PONV Risk Score and Plan: 2 and Ondansetron, Dexamethasone and Midazolam  Airway Management Planned: LMA and Oral ETT  Additional Equipment:   Intra-op Plan:   Post-operative Plan: Extubation in OR  Informed Consent: I have reviewed the patients History and Physical, chart, labs and discussed the procedure including the risks, benefits and alternatives for the proposed anesthesia with the patient or authorized representative who has indicated his/her understanding and acceptance.     Dental advisory given  Plan Discussed with: CRNA and Anesthesiologist  Anesthesia Plan Comments: (See PAT note 06/05/2022)      Anesthesia Quick Evaluation

## 2022-06-06 NOTE — Progress Notes (Signed)
Anesthesia Chart Review   Case: [id number] Date/Time: 06/18/22 0945   Procedure: RIGHT TOTAL KNEE ARTHROPLASTY (Right: Knee)   Anesthesia type: Spinal   Pre-op diagnosis: RIGHT KNEE OSTEOARTHRITIS   Location: [facility]OM 06 / [facility]   Surgeons: Gean Birchwood, MD       DISCUSSION:66 y.o. never smoker with h/o OSA on CPAP, HTN, CKD Stage II, AAA (4.4 cm unchanged on 12/13/2021 CT), HOCM, AICD in place (device orders in 06/05/2022 progress note, procedure should not interfere), atrial fibrillation, CAD, RA, right knee OA scheduled for above procedure 06/18/2022 with Dr. Gean Birchwood.   Per pulmonology preoperative evaluation, "As long as he is not having any new or worsening pulmonary symptoms and has not been sick in the last 30 days. Patient is low-intermediate risk for prolonged mechanical ventilation and/or post op pulmonary complications from surgery d/t history of sleep apnea and reactive airways disease. Pulmonary function testing was normal in August 2022 and CXR was normal on 02/07/22. He is optimized for surgery from pulmonary standpoint, ultimate clearance will be decided by surgeon.:  Per cardiology preoperative evaluation, "Chart reviewed as part of pre-operative protocol coverage. Given past medical history and time since last visit, based on ACC/AHA guidelines, Ryan Glass would be at acceptable risk for the planned procedure without further cardiovascular testing.    Patient was previously seen by Dr. Gala Romney in April and the cleared for left total knee surgery, now he require right total knee surgery.  He may hold Eliquis for 3 days prior to the procedure and restart as soon as possible afterward."  Anticipate pt can proceed with planned procedure barring acute status change.   VS: BP (!) 139/107   Pulse 90   Temp 36.5 C (Oral)   Resp 16   Ht 5\' 8"  (1.727 m)   Wt 104.8 kg   SpO2 95%   BMI 35.12 kg/m   PROVIDERS: , MD is PCP   [name], MD is Cardiologist   LABS: Labs reviewed: Acceptable for surgery. (all labs ordered are listed, but only abnormal results are displayed)  Labs Reviewed  BASIC METABOLIC PANEL - Abnormal; Notable for the following components:      Result Value   CO2 21 (*)    Creatinine, Ser 1.25 (*)    All other components within normal limits  SURGICAL PCR SCREEN  CBC  TYPE AND SCREEN     IMAGES: CT Angio Chest 12/13/2021 IMPRESSION: 1. Overall unchanged ascending thoracic aortic aneurysm, measuring approximately 4 4 cm. Recommend annual imaging followup by CTA or MRA. This recommendation follows 2010 ACCF/AHA/AATS/ACR/ASA/SCA/SCAI/SIR/STS/SVM Guidelines for the Diagnosis and Management of Patients with Thoracic Aortic Disease. Circulation. 2010; 1212011. Aortic aneurysm NOS (ICD10-I71.9) 2. Stable dilatation of the celiac trunk, measuring approximately 1.8 cm. 3. Cardiomegaly with a small pericardial effusion.  EKG: 04/05/2022 Rate 90 bpm  Atrial-paced rhythm with prolonged AV conduction Right bundle branch block Marked T-wave abnormality, consider inferolateral ischemia Abnormal ECG When compared with ECG of 07-Oct-2021 18:37, PREVIOUS ECG IS PRESENT Wide QRS tachycardia NO LONGER PRESENT  CV: Echo 08/18/2021 1. Will have Dr 10/18/2021 review regarding closure of PFO in regard to  patients hypoxemai.   2. Left ventricular ejection fraction, by estimation, is 55 to 60%. The  left ventricle has normal function. There is mild left ventricular  hypertrophy. Left ventricular diastolic function could not be evaluated.   3. AICD wires in RV/RA. Right ventricular systolic function is moderately  reduced. The right ventricular  size is moderately enlarged.   4. R/L upper pulmonary veins and LLPV normal Unable to visualize RLPV.  Left atrial size was mildly dilated. No left atrial/left atrial appendage  thrombus was detected.   5. Evidence of atrial level shunting detected by color flow Doppler.  Agitated  saline contrast bubble study was positive with shunting observed  within 3-6 cardiac cycles suggestive of interatrial shunt.   6. Right atrial size was mildly dilated.   7. A small pericardial effusion is present. The pericardial effusion is  anterior to the right ventricle.   8. The mitral valve is grossly normal. Mild mitral valve regurgitation.   9. The aortic valve is tricuspid. Aortic valve regurgitation is mild.  10. Aortic dilatation noted. 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 There is mild dilatation of the ascending aorta,  measuring 41 mm.   Cardiac Cath 05/29/2021 Prox RCA lesion is 20% stenosed. Prox Cx to Mid Cx lesion is 20% stenosed. Mid LAD lesion is 30% stenosed.   Mild non-obstructive CAD Normal right and left heart pressures   Recommendations: Medical management of mild CAD. Will resume IV heparin 6 hours post sheath pull given acute PE. Would transition to Eliquis or Xarelto tomorrow.   Myocardial Perfusion 02/07/21 The left ventricular ejection fraction is normal (55-65%). Nuclear stress EF: 56%. There was no ST segment deviation noted during stress. No T wave inversion was noted during stress. This is a low risk study.   No reversible ischemia. LVEF 56% with normal wall motion. This is a low risk study. No prior for comparison.   Past Medical History:  Diagnosis Date   AICD (automatic cardioverter/defibrillator) present    Ascending aortic aneurysm ([place])    a. CT 05/2021 showed 4.4x4.4cm TAA. Stable on chest CTA 12/2021 at 4.4cm.   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 There is severe asymmetric left ventricular hypertrophy. G1DD present.    History of COVID-19 06/07/2021   HOCM (hypertrophic obstructive cardiomyopathy) ([place])    s/p ICD 07/2021   Hypertension    followed by cardiology, dr t. turner   (05-14-2018 nuclear study in epic , normal perfusion with nuclear ef 61%)   Left hydrocele    Low serum potassium level 04/27/2014   OSA on CPAP    per pt uses every night   Palpitations followed by dr t. [name]   06-08-2020  still feels palipations due to PVCs when exertion but not with chest pain/ discomfort   PVC's (premature ventricular contractions) cardiologist--- dr t. turner   Status post PVC ablation by [name] 2019 with recurrence of frequent PVCs/bigeminy;  prior pseudobradycardia r/t pvcs   Rheumatoid arthritis involving multiple sites [facility])    rheumotology--- dr a. Gerilyn Nestle  ([facility] in [place])    Past Surgical History:  Procedure Laterality Date   BIOPSY  10/08/2021   Procedure: BIOPSY;  Surgeon: Jenel Lucks, MD;  Location: [facility] ENDOSCOPY;  Service: Gastroenterology;;   Thressa Sheller STUDY  08/18/2021   Procedure: BUBBLE STUDY;  Surgeon: Wendall Stade, MD;  Location: [facility] ENDOSCOPY;  Service: Cardiovascular;;   ESOPHAGOGASTRODUODENOSCOPY N/A 10/08/2021   Procedure: ESOPHAGOGASTRODUODENOSCOPY (EGD);  Surgeon: Jenel Lucks, MD;  Location: [facility] ENDOSCOPY;  Service: Gastroenterology;  Laterality: N/A;   HYDROCELE EXCISION Left 06/14/2020   Procedure: LEFT  HYDROCELECTOMY ADULT;  Surgeon: Noel Christmas, MD;  Location: [facility];  Service: Urology;  Laterality: Left;   ICD IMPLANT N/A 08/07/2021   Procedure: ICD IMPLANT;  Surgeon: Marinus Maw, MD;  Location: [facility] INVASIVE CV LAB;  Service: Cardiovascular;  Laterality: N/A;   INCISIONAL HERNIA  REPAIR  02-23-2016   @[facility]    LAPAROSCOPIC   LAPAROSCOPIC INGUINAL HERNIA REPAIR Bilateral 08-22-2015  @[facility]    AND UMBILICAL HERNIA REPAIR   PVC ABLATION N/A 10/07/2018   Procedure: PVC ABLATION;  Surgeon: , MD;  Location: [facility] INVASIVE CV LAB;  Service: Cardiovascular;  Laterality: N/A;   RIGHT/LEFT HEART CATH AND CORONARY ANGIOGRAPHY N/A 05/29/2021   Procedure: RIGHT/LEFT HEART CATH AND CORONARY ANGIOGRAPHY;  Surgeon: Marinus Maw, MD;  Location: [facility] INVASIVE CV LAB;  Service: Cardiovascular;  Laterality: N/A;   TEE WITHOUT CARDIOVERSION N/A 08/18/2021   Procedure: TRANSESOPHAGEAL ECHOCARDIOGRAM (TEE);  Surgeon: Kathleene Hazel, MD;  Location: [facility] ENDOSCOPY;  Service: Cardiovascular;  Laterality: N/A;   TOTAL KNEE ARTHROPLASTY Left 04/30/2022   Procedure: LEFT TOTAL KNEE ARTHROPLASTY;  Surgeon: [facility], MD;  Location: [facility];  Service: Orthopedics;  Laterality: Left;   UMBILICAL HERNIA REPAIR  child    MEDICATIONS:  Abatacept (ORENCIA CLICKJECT) 125 MG/ML SOAJ   albuterol (VENTOLIN HFA) 108 (90 Base) MCG/ACT inhaler   atorvastatin (LIPITOR) 40 MG tablet   Cholecalciferol 1.25 MG (50000 UT) TABS   colchicine 0.6 MG tablet   docusate sodium (COLACE) 100 MG capsule   ELIQUIS 5 MG TABS tablet   febuxostat (ULORIC) 40 MG tablet   Ferrous Sulfate (IRON PO)   fluticasone (FLONASE) 50 MCG/ACT nasal spray   fluticasone-salmeterol (ADVAIR) 250-50 MCG/ACT AEPB   folic acid (FOLVITE) 1 MG tablet   furosemide (LASIX) 40 MG tablet   gabapentin (NEURONTIN) 600 MG tablet   hydrALAZINE (APRESOLINE) 100 MG tablet   loratadine (CLARITIN) 10 MG tablet   Magnesium Oxide 400 MG CAPS   montelukast (SINGULAIR) 10 MG tablet   Multiple Vitamin (MULTIVITAMIN WITH MINERALS) TABS tablet   oxyCODONE (OXY IR/ROXICODONE) 5 MG immediate release tablet   pantoprazole (PROTONIX) 40 MG tablet   polyethylene glycol (MIRALAX) 17 g packet   potassium chloride SA (KLOR-CON M) 20 MEQ tablet    sildenafil (REVATIO) 20 MG tablet   sotalol (BETAPACE) 120 MG tablet   spironolactone (ALDACTONE) 25 MG tablet   tiZANidine (ZANAFLEX) 2 MG tablet   No current facility-administered medications for this encounter.    05/02/2022 [place], PA-C [facility]-Surgical Testing [phone]

## 2022-06-14 ENCOUNTER — Other Ambulatory Visit: Payer: Self-pay | Admitting: Registered Nurse

## 2022-06-14 ENCOUNTER — Encounter: Payer: Self-pay | Admitting: Registered Nurse

## 2022-06-14 DIAGNOSIS — E559 Vitamin D deficiency, unspecified: Secondary | ICD-10-CM

## 2022-06-14 NOTE — Telephone Encounter (Signed)
Refilled electronic Rx cholecalciferol 50,000 units po weekly #12 RF0 and patient to have vitamin D level in 3 months upon return to work.  Total knee surgery scheduled 10 Jul and will be out of work 6-8 weeks tentatively.

## 2022-06-15 DIAGNOSIS — M1711 Unilateral primary osteoarthritis, right knee: Secondary | ICD-10-CM | POA: Diagnosis present

## 2022-06-15 NOTE — H&P (Signed)
TOTAL KNEE ADMISSION H&P  Patient is being admitted for right total knee arthroplasty.  Subjective:  Chief Complaint:right knee pain.  HPI: Ryan Glass, 66 y.o. male, has a history of pain and functional disability in the right knee due to arthritis and has failed non-surgical conservative treatments for greater than 12 weeks to includeNSAID's and/or analgesics, corticosteriod injections, flexibility and strengthening excercises, supervised PT with diminished ADL's post treatment, use of assistive devices, and activity modification.  Onset of symptoms was gradual, starting  several  years ago with gradually worsening course since that time. The patient noted no past surgery on the right knee(s).  Patient currently rates pain in the right knee(s) at 10 out of 10 with activity. Patient has night pain, worsening of pain with activity and weight bearing, pain that interferes with activities of daily living, pain with passive range of motion, crepitus, and joint swelling.  Patient has evidence of subchondral sclerosis, periarticular osteophytes, and joint space narrowing by imaging studies.  There is no active infection.  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   CAP (community acquired pneumonia) 10/13/2021   Dyspnea    Acute respiratory failure with hypoxia ([place]) 10/07/2021   ABLA (acute blood loss anemia) 10/07/2021   History of pulmonary embolus (PE) 10/07/2021   AF (paroxysmal atrial fibrillation) ([place]) 10/07/2021   Hypertrophic cardiomyopathy ([place]) 08/15/2021   Ventricular  tachycardia ([place]) 08/02/2021   Ventricular tachycardia, unspecified ([place]) 08/02/2021   Acute on chronic respiratory failure ([place]) 06/07/2021   History of COVID-19 06/07/2021   Rheumatoid arthritis involving multiple sites on Humira ([place]) 06/07/2021   On chronic prednisone therapy for Rheumatoid Arthritis 06/07/2021   CAD (coronary artery disease), non-obstructive 05/30/2021   Precordial chest pain    Heart failure with mid-range ejection fraction ([place]) 05/27/2021   LVH (left ventricular hypertrophy) due to hypertensive disease, with heart failure ([place]) 05/27/2021   Class 2 severe obesity with serious comorbidity and body mass index (BMI) of 35.0 to 35.9 in adult [facility]) 05/26/2021   Pulmonary embolism ([place]) 05/25/2021   Thoracic aortic aneurysm without rupture ([place]) 02/01/2021   Ascending aortic aneurysm ([place]) 02/01/2021   Other hyperlipidemia 10/14/2020   Testicular swelling 05/14/2019   Pain in right knee 01/29/2019   PVC (premature ventricular contraction) 10/07/2018   Ventricular premature depolarization 10/07/2018   Dilated aortic root (HCC)    Allergic rhinitis 01/10/2016   Chronic gout without tophus 01/10/2016   Drug therapy 01/10/2016   OSA (obstructive sleep apnea) 01/10/2016   Colonic polyp 12/14/2015   Rt groin pain 12/14/2015   Scoliosis of thoracolumbar spine 12/14/2015   Right lower quadrant abdominal pain 04/20/2015   Arthritis of knee 02/22/2015   Edema of extremities 02/22/2015   Essential hypertension 02/22/2015   Preventative health care 02/22/2015   Bradycardia by electrocardiogram 04/16/2014   Past Medical History:  Diagnosis Date   AICD (automatic cardioverter/defibrillator) present    Ascending aortic aneurysm ([place])    a. CT 05/2021 showed 4.4x4.4cm TAA. Stable on chest CTA 12/2021 at 4.4cm.   Atrial fibrillation (HCC)    Bilateral lower extremity edema  Celiac artery dilatation (HCC)    1.8cm at the celiac trunk on Chest CT 12/2021   CHF (congestive  heart failure) (HCC)    Chronic gout without tophus    followed by dr Gerilyn Nestle    (06-08-2020  per pt last episode right knee 3 wks ago)   CKD (chronic kidney disease), stage II    nephrology--- Jen Mow PA (10-29-2019 note in epic scanned in  media)   Coronary artery disease    Heart failure with mid-range ejection fraction ([place]) 05/27/2021   05/26/21: Left ventricular ejection fraction, by estimation, is 45 to 50%. There is severe asymmetric left ventricular hypertrophy. G1DD present.    History of COVID-19 06/07/2021   HOCM (hypertrophic obstructive cardiomyopathy) ([place])    s/p ICD 07/2021   Hypertension    followed by cardiology, dr t. turner   (05-14-2018 nuclear study in epic , normal perfusion with nuclear ef 61%)   Left hydrocele    Low serum potassium level 04/27/2014   OSA on CPAP    per pt uses every night   Palpitations followed by dr t. [name]   06-08-2020  still feels palipations due to PVCs when exertion but not with chest pain/ discomfort   PVC's (premature ventricular contractions) cardiologist--- dr t. turner   Status post PVC ablation by [name] 2019 with recurrence of frequent PVCs/bigeminy;  prior pseudobradycardia r/t pvcs   Rheumatoid arthritis involving multiple sites [facility])    rheumotology--- dr a. Gerilyn Nestle  ([facility] in [place])    Past Surgical History:  Procedure Laterality Date   BIOPSY  10/08/2021   Procedure: BIOPSY;  Surgeon: Daryel November, MD;  Location: [facility];  Service: Gastroenterology;;   Kathleen Argue STUDY  08/18/2021   Procedure: BUBBLE STUDY;  Surgeon: Josue Hector, MD;  Location: [facility] ENDOSCOPY;  Service: Cardiovascular;;   ESOPHAGOGASTRODUODENOSCOPY N/A 10/08/2021   Procedure: ESOPHAGOGASTRODUODENOSCOPY (EGD);  Surgeon: Daryel November, MD;  Location: [place];  Service: Gastroenterology;  Laterality: N/A;   HYDROCELE EXCISION Left 06/14/2020   Procedure: LEFT  HYDROCELECTOMY ADULT;  Surgeon: Robley Fries, MD;  Location:  [facility];  Service: Urology;  Laterality: Left;   ICD IMPLANT N/A 08/07/2021   Procedure: ICD IMPLANT;  Surgeon: Evans Lance, MD;  Location: [place] CV LAB;  Service: Cardiovascular;  Laterality: N/A;   INCISIONAL HERNIA REPAIR  02-23-2016   @[facility]    LAPAROSCOPIC   LAPAROSCOPIC INGUINAL HERNIA REPAIR Bilateral 08-22-2015  @[facility]    AND UMBILICAL HERNIA REPAIR   PVC ABLATION N/A 10/07/2018   Procedure: PVC ABLATION;  Surgeon: Evans Lance, MD;  Location: [place] CV LAB;  Service: Cardiovascular;  Laterality: N/A;   RIGHT/LEFT HEART CATH AND CORONARY ANGIOGRAPHY N/A 05/29/2021   Procedure: RIGHT/LEFT HEART CATH AND CORONARY ANGIOGRAPHY;  Surgeon: Burnell Blanks, MD;  Location: [place] CV LAB;  Service: Cardiovascular;  Laterality: N/A;   TEE WITHOUT CARDIOVERSION N/A 08/18/2021   Procedure: TRANSESOPHAGEAL ECHOCARDIOGRAM (TEE);  Surgeon: Josue Hector, MD;  Location: [facility] ENDOSCOPY;  Service: Cardiovascular;  Laterality: N/A;   TOTAL KNEE ARTHROPLASTY Left 04/30/2022   Procedure: LEFT TOTAL KNEE ARTHROPLASTY;  Surgeon: Frederik Pear, MD;  Location: [facility] ORS;  Service: Orthopedics;  Laterality: Left;   UMBILICAL HERNIA REPAIR  child    No current facility-administered medications for this encounter.   Current Outpatient Medications  Medication Sig Dispense Refill Last Dose   Abatacept (ORENCIA CLICKJECT) 0000000 MG/ML SOAJ Inject 125 mg into the skin once a  week.      albuterol (VENTOLIN HFA) 108 (90 Base) MCG/ACT inhaler Inhale 2 puffs into the lungs every 4 (four) hours as needed for wheezing or shortness of breath. 8 g 2    atorvastatin (LIPITOR) 40 MG tablet Take 1 tablet (40 mg total) by mouth daily. 90 tablet 0    Cholecalciferol 1.25 MG (50000 UT) TABS Take 1 tablet by mouth once a week. 12 tablet 0    colchicine 0.6 MG tablet Take 1 tablet (0.6 mg total) by mouth daily as needed (for gout flare). 10 tablet 0    ELIQUIS 5 MG TABS tablet Take 5 mg by mouth  2 (two) times daily.      febuxostat (ULORIC) 40 MG tablet Take 40 mg by mouth daily.      Ferrous Sulfate (IRON PO) Take 1 tablet by mouth daily.      fluticasone (FLONASE) 50 MCG/ACT nasal spray Place 1 spray into both nostrils daily. (Patient taking differently: Place 1 spray into both nostrils daily as needed for allergies.)      fluticasone-salmeterol (ADVAIR) 250-50 MCG/ACT AEPB Inhale 1 puff into the lungs 2 (two) times daily as needed (shortness of breath).      folic acid (FOLVITE) 1 MG tablet Take 1 mg by mouth daily.      furosemide (LASIX) 40 MG tablet TAKE ONE TABLET BY MOUTH DAILY FOR THREE DAYS **THEN TAKE AS NEEDED FOR INCREASE WEIGHT GAIN** 90 tablet 3    gabapentin (NEURONTIN) 600 MG tablet Take 600 mg by mouth 3 (three) times daily.      hydrALAZINE (APRESOLINE) 100 MG tablet Take 100 mg by mouth 3 (three) times daily.      loratadine (CLARITIN) 10 MG tablet Take 10 mg by mouth daily.      Magnesium Oxide 400 MG CAPS Take 1 capsule (400 mg total) by mouth every other day. While taking daily multivitamin with 100mg  magnesium every day 90 capsule 3    montelukast (SINGULAIR) 10 MG tablet Take 1 tablet (10 mg total) by mouth at bedtime. 90 tablet 3    Multiple Vitamin (MULTIVITAMIN WITH MINERALS) TABS tablet Take 1 tablet by mouth daily.      oxyCODONE (OXY IR/ROXICODONE) 5 MG immediate release tablet Take 5 mg by mouth 4 (four) times daily as needed for moderate pain.      potassium chloride SA (KLOR-CON M) 20 MEQ tablet Take 1 tablet by  mouth only when you have to use a Lasix 90 tablet 3    sotalol (BETAPACE) 120 MG tablet Take 1 tablet (120 mg total) by mouth every 12 (twelve) hours. 60 tablet 8    spironolactone (ALDACTONE) 25 MG tablet TAKE ONE TABLET BY MOUTH DAILY 90 tablet 2    tiZANidine (ZANAFLEX) 2 MG tablet Take 1 tablet (2 mg total) by mouth every 6 (six) hours as needed. 60 tablet 0    docusate sodium (COLACE) 100 MG capsule Take 1 capsule (100 mg total) by mouth  every 12 (twelve) hours. (Patient not taking: Reported on 05/30/2022) 60 capsule 0 Not Taking   pantoprazole (PROTONIX) 40 MG tablet Take 1 tablet (40 mg total) by mouth 2 (two) times daily. (Patient not taking: Reported on 04/25/2022) 60 tablet 1    polyethylene glycol (MIRALAX) 17 g packet Take 17 g by mouth 2 (two) times daily. (Patient not taking: Reported on 05/30/2022) 14 each 0 Not Taking   sildenafil (REVATIO) 20 MG tablet Take 20 mg by mouth daily as  needed (erectile dysfunction).      Vitamin D, Ergocalciferol, (DRISDOL) 1.25 MG (50000 UNIT) CAPS capsule TAKE ONE CAPSULE BY MOUTH ONCE WEEKLY 12 capsule 0    Allergies  Allergen Reactions   Amlodipine Besylate Swelling and Other (See Comments)    Leg swelling with 10 mg daily am dosing; decreased with BID 5 mg dosing   Aspirin Itching    Social History   Tobacco Use   Smoking status: Never   Smokeless tobacco: Never  Substance Use Topics   Alcohol use: Never    Family History  Problem Relation Age of Onset   Cardiomyopathy Mother    Heart attack Father      Review of Systems  Constitutional: Negative.   HENT:  Positive for sinus pain.   Eyes: Negative.   Respiratory:  Positive for shortness of breath.   Cardiovascular:  Positive for leg swelling.       Hx of blood clots  Gastrointestinal: Negative.   Endocrine: Negative.   Musculoskeletal:  Positive for arthralgias.  Skin: Negative.   Neurological: Negative.   Hematological: Negative.   Psychiatric/Behavioral: Negative.      Objective:  Physical Exam Constitutional:      Appearance: Normal appearance. He is normal weight.  HENT:     Head: Normocephalic and atraumatic.     Nose: Nose normal.     Mouth/Throat:     Mouth: Mucous membranes are moist.     Pharynx: Oropharynx is clear.  Eyes:     Pupils: Pupils are equal, round, and reactive to light.  Cardiovascular:     Pulses: Normal pulses.  Pulmonary:     Effort: Pulmonary effort is normal.   Musculoskeletal:        General: Swelling, tenderness and deformity present.     Cervical back: Normal range of motion and neck supple.     Comments: His right knee continues to have varus deformity with a 5 forward flexion contracture.  Neurological:     General: No focal deficit present.     Mental Status: He is alert and oriented to person, place, and time. Mental status is at baseline.  Psychiatric:        Mood and Affect: Mood normal.        Behavior: Behavior normal.        Thought Content: Thought content normal.        Judgment: Judgment normal.     Vital signs in last 24 hours:    Labs:   Estimated body mass index is 35.12 kg/m as calculated from the following:   Height as of 06/05/22: 5\' 8"  (1.727 m).   Weight as of 06/05/22: 104.8 kg.   Imaging Review Plain radiographs demonstrate AP Rosenberg lateral and sunrise x-rays show bilateral end-stage arthritis medial compartment bone-on-bone with erosion of the medial tibial plateaus 1-2 mm.  Lateral subluxation of tibia is been the femurs 4-5 mm bilaterally.       Assessment/Plan:  End stage arthritis, right knee   The patient history, physical examination, clinical judgment of the provider and imaging studies are consistent with end stage degenerative joint disease of the right knee(s) and total knee arthroplasty is deemed medically necessary. The treatment options including medical management, injection therapy arthroscopy and arthroplasty were discussed at length. The risks and benefits of total knee arthroplasty were presented and reviewed. The risks due to aseptic loosening, infection, stiffness, patella tracking problems, thromboembolic complications and other imponderables were discussed. The patient acknowledged the explanation,  agreed to proceed with the plan and consent was signed. Patient is being admitted for inpatient treatment for surgery, pain control, PT, OT, prophylactic antibiotics, VTE prophylaxis,  progressive ambulation and ADL's and discharge planning. The patient is planning to be discharged home with home health services     Patient's anticipated LOS is less than 2 midnights, meeting these requirements: - Younger than 3 - Lives within 1 hour of care - Has a competent adult at home to recover with post-op recover - NO history of  - Chronic pain requiring opiods  - Diabetes  - Coronary Artery Disease  - Heart failure  - Heart attack  - Stroke  - DVT/VTE  - Cardiac arrhythmia  - Respiratory Failure/COPD  - Renal failure  - Anemia  - Advanced Liver disease

## 2022-06-18 ENCOUNTER — Other Ambulatory Visit: Payer: Self-pay

## 2022-06-18 ENCOUNTER — Encounter (HOSPITAL_COMMUNITY): Payer: Self-pay | Admitting: Orthopedic Surgery

## 2022-06-18 ENCOUNTER — Ambulatory Visit (HOSPITAL_COMMUNITY): Payer: No Typology Code available for payment source | Admitting: Physician Assistant

## 2022-06-18 ENCOUNTER — Observation Stay (HOSPITAL_COMMUNITY)
Admission: RE | Admit: 2022-06-18 | Discharge: 2022-06-19 | Disposition: A | Discharge status: Home Health | Payer: No Typology Code available for payment source | Source: Ambulatory Visit | Attending: Orthopedic Surgery | Admitting: Orthopedic Surgery

## 2022-06-18 ENCOUNTER — Ambulatory Visit (HOSPITAL_BASED_OUTPATIENT_CLINIC_OR_DEPARTMENT_OTHER): Payer: No Typology Code available for payment source | Admitting: Certified Registered Nurse Anesthetist

## 2022-06-18 ENCOUNTER — Telehealth: Payer: Self-pay | Admitting: Registered Nurse

## 2022-06-18 ENCOUNTER — Encounter (HOSPITAL_COMMUNITY)
Admission: RE | Disposition: A | Discharge status: Home Health | Payer: Self-pay | Source: Ambulatory Visit | Attending: Orthopedic Surgery

## 2022-06-18 DIAGNOSIS — Z7901 Long term (current) use of anticoagulants: Secondary | ICD-10-CM | POA: Insufficient documentation

## 2022-06-18 DIAGNOSIS — M1711 Unilateral primary osteoarthritis, right knee: Secondary | ICD-10-CM

## 2022-06-18 DIAGNOSIS — I2510 Atherosclerotic heart disease of native coronary artery without angina pectoris: Secondary | ICD-10-CM | POA: Insufficient documentation

## 2022-06-18 DIAGNOSIS — Z95810 Presence of automatic (implantable) cardiac defibrillator: Secondary | ICD-10-CM | POA: Insufficient documentation

## 2022-06-18 DIAGNOSIS — I5022 Chronic systolic (congestive) heart failure: Secondary | ICD-10-CM | POA: Diagnosis not present

## 2022-06-18 DIAGNOSIS — I480 Paroxysmal atrial fibrillation: Secondary | ICD-10-CM | POA: Insufficient documentation

## 2022-06-18 DIAGNOSIS — Z79899 Other long term (current) drug therapy: Secondary | ICD-10-CM | POA: Insufficient documentation

## 2022-06-18 DIAGNOSIS — N182 Chronic kidney disease, stage 2 (mild): Secondary | ICD-10-CM | POA: Insufficient documentation

## 2022-06-18 DIAGNOSIS — I110 Hypertensive heart disease with heart failure: Secondary | ICD-10-CM | POA: Diagnosis not present

## 2022-06-18 DIAGNOSIS — J45909 Unspecified asthma, uncomplicated: Secondary | ICD-10-CM | POA: Insufficient documentation

## 2022-06-18 DIAGNOSIS — Z01818 Encounter for other preprocedural examination: Secondary | ICD-10-CM

## 2022-06-18 DIAGNOSIS — I5020 Unspecified systolic (congestive) heart failure: Secondary | ICD-10-CM | POA: Insufficient documentation

## 2022-06-18 DIAGNOSIS — I130 Hypertensive heart and chronic kidney disease with heart failure and stage 1 through stage 4 chronic kidney disease, or unspecified chronic kidney disease: Secondary | ICD-10-CM | POA: Insufficient documentation

## 2022-06-18 DIAGNOSIS — Z96652 Presence of left artificial knee joint: Secondary | ICD-10-CM | POA: Insufficient documentation

## 2022-06-18 DIAGNOSIS — M009 Pyogenic arthritis, unspecified: Secondary | ICD-10-CM | POA: Diagnosis not present

## 2022-06-18 DIAGNOSIS — Z86711 Personal history of pulmonary embolism: Secondary | ICD-10-CM | POA: Insufficient documentation

## 2022-06-18 DIAGNOSIS — Z8616 Personal history of COVID-19: Secondary | ICD-10-CM | POA: Insufficient documentation

## 2022-06-18 DIAGNOSIS — M25561 Pain in right knee: Secondary | ICD-10-CM | POA: Diagnosis not present

## 2022-06-18 HISTORY — PX: TOTAL KNEE ARTHROPLASTY: SHX125

## 2022-06-18 SURGERY — ARTHROPLASTY, KNEE, TOTAL
Anesthesia: General | Site: Knee | Laterality: Right

## 2022-06-18 MED ORDER — FENTANYL CITRATE (PF) 100 MCG/2ML IJ SOLN
INTRAMUSCULAR | Status: DC | PRN
  Administered 2022-06-18 (×2): 50 ug via INTRAVENOUS

## 2022-06-18 MED ORDER — BUPIVACAINE LIPOSOME 1.3 % IJ SUSP
INTRAMUSCULAR | Status: AC
  Filled 2022-06-18: qty 20

## 2022-06-18 MED ORDER — SOTALOL HCL 120 MG PO TABS
120.0000 mg | ORAL_TABLET | Freq: Two times a day (BID) | ORAL | Status: DC
  Administered 2022-06-18 – 2022-06-19 (×2): 120 mg via ORAL
  Filled 2022-06-18 (×2): qty 1

## 2022-06-18 MED ORDER — METOCLOPRAMIDE HCL 5 MG/ML IJ SOLN: 5.0000 mg | Freq: Three times a day (TID) | INTRAMUSCULAR | Status: DC | PRN

## 2022-06-18 MED ORDER — MENTHOL 3 MG MT LOZG: 1.0000 | LOZENGE | OROMUCOSAL | Status: DC | PRN

## 2022-06-18 MED ORDER — PROPOFOL 500 MG/50ML IV EMUL
INTRAVENOUS | Status: DC | PRN
  Administered 2022-06-18: 100 ug/kg/min via INTRAVENOUS

## 2022-06-18 MED ORDER — LACTATED RINGERS IV SOLN: INTRAVENOUS | Status: DC

## 2022-06-18 MED ORDER — HYDRALAZINE HCL 50 MG PO TABS
100.0000 mg | ORAL_TABLET | Freq: Three times a day (TID) | ORAL | Status: DC
Start: 2022-06-18 — End: 2022-06-19
  Administered 2022-06-18 – 2022-06-19 (×3): 100 mg via ORAL
  Filled 2022-06-18 (×3): qty 2

## 2022-06-18 MED ORDER — DOCUSATE SODIUM 100 MG PO CAPS
100.0000 mg | ORAL_CAPSULE | Freq: Two times a day (BID) | ORAL | Status: DC
  Administered 2022-06-18 – 2022-06-19 (×2): 100 mg via ORAL
  Filled 2022-06-18 (×2): qty 1

## 2022-06-18 MED ORDER — SODIUM CHLORIDE (PF) 0.9 % IJ SOLN
INTRAMUSCULAR | Status: AC
  Filled 2022-06-18: qty 50

## 2022-06-18 MED ORDER — HYDROMORPHONE HCL 1 MG/ML IJ SOLN
INTRAMUSCULAR | Status: AC
  Filled 2022-06-18: qty 1

## 2022-06-18 MED ORDER — OXYCODONE HCL 5 MG PO TABS: 5.0000 mg | ORAL_TABLET | Freq: Four times a day (QID) | ORAL | 0 refills | Status: DC | PRN

## 2022-06-18 MED ORDER — MOMETASONE FURO-FORMOTEROL FUM 200-5 MCG/ACT IN AERO
2.0000 | INHALATION_SPRAY | Freq: Two times a day (BID) | RESPIRATORY_TRACT | Status: DC
  Administered 2022-06-18 – 2022-06-19 (×2): 2 via RESPIRATORY_TRACT
  Filled 2022-06-18: qty 8.8

## 2022-06-18 MED ORDER — PROPOFOL 10 MG/ML IV BOLUS
INTRAVENOUS | Status: DC | PRN
  Administered 2022-06-18: 150 mg via INTRAVENOUS

## 2022-06-18 MED ORDER — ALUM & MAG HYDROXIDE-SIMETH 200-200-20 MG/5ML PO SUSP: 30.0000 mL | ORAL | Status: DC | PRN

## 2022-06-18 MED ORDER — ALBUTEROL SULFATE HFA 108 (90 BASE) MCG/ACT IN AERS
2.0000 | INHALATION_SPRAY | RESPIRATORY_TRACT | Status: DC | PRN
Start: 2022-06-18 — End: 2022-06-18

## 2022-06-18 MED ORDER — HYDROMORPHONE HCL 1 MG/ML IJ SOLN
0.2500 mg | INTRAMUSCULAR | Status: DC | PRN
  Administered 2022-06-18 (×2): 0.5 mg via INTRAVENOUS

## 2022-06-18 MED ORDER — ALBUTEROL SULFATE (2.5 MG/3ML) 0.083% IN NEBU: 2.5000 mg | INHALATION_SOLUTION | RESPIRATORY_TRACT | Status: DC | PRN

## 2022-06-18 MED ORDER — ACETAMINOPHEN 325 MG PO TABS
325.0000 mg | ORAL_TABLET | Freq: Four times a day (QID) | ORAL | Status: DC | PRN
  Administered 2022-06-19: 650 mg via ORAL
  Filled 2022-06-18: qty 2

## 2022-06-18 MED ORDER — HYDROMORPHONE HCL 1 MG/ML IJ SOLN
INTRAMUSCULAR | Status: DC | PRN
  Administered 2022-06-18: .6 mg via INTRAVENOUS
  Administered 2022-06-18: .4 mg via INTRAVENOUS

## 2022-06-18 MED ORDER — LORATADINE 10 MG PO TABS
10.0000 mg | ORAL_TABLET | Freq: Every day | ORAL | Status: DC
Start: 2022-06-18 — End: 2022-06-19
  Administered 2022-06-18 – 2022-06-19 (×2): 10 mg via ORAL
  Filled 2022-06-18 (×2): qty 1

## 2022-06-18 MED ORDER — FUROSEMIDE 40 MG PO TABS
40.0000 mg | ORAL_TABLET | Freq: Two times a day (BID) | ORAL | Status: DC | PRN
Start: 2022-06-18 — End: 2022-06-19

## 2022-06-18 MED ORDER — TRANEXAMIC ACID-NACL 1000-0.7 MG/100ML-% IV SOLN
INTRAVENOUS | Status: AC
  Filled 2022-06-18: qty 100

## 2022-06-18 MED ORDER — COLCHICINE 0.6 MG PO TABS: 0.6000 mg | ORAL_TABLET | Freq: Every day | ORAL | Status: DC | PRN

## 2022-06-18 MED ORDER — ONDANSETRON HCL 4 MG PO TABS: 4.0000 mg | ORAL_TABLET | Freq: Four times a day (QID) | ORAL | Status: DC | PRN

## 2022-06-18 MED ORDER — ABATACEPT 125 MG/ML ~~LOC~~ SOAJ
125.0000 mg | SUBCUTANEOUS | Status: DC
Start: 2022-06-18 — End: 2022-06-18

## 2022-06-18 MED ORDER — APIXABAN 2.5 MG PO TABS
2.5000 mg | ORAL_TABLET | Freq: Two times a day (BID) | ORAL | Status: DC
  Administered 2022-06-19: 2.5 mg via ORAL
  Filled 2022-06-18: qty 1

## 2022-06-18 MED ORDER — CEFAZOLIN SODIUM-DEXTROSE 2-4 GM/100ML-% IV SOLN
2.0000 g | INTRAVENOUS | Status: AC
  Administered 2022-06-18: 2 g via INTRAVENOUS
  Filled 2022-06-18: qty 100

## 2022-06-18 MED ORDER — GABAPENTIN 300 MG PO CAPS
600.0000 mg | ORAL_CAPSULE | Freq: Three times a day (TID) | ORAL | Status: DC
  Administered 2022-06-18 – 2022-06-19 (×3): 600 mg via ORAL
  Filled 2022-06-18 (×3): qty 2

## 2022-06-18 MED ORDER — TRANEXAMIC ACID-NACL 1000-0.7 MG/100ML-% IV SOLN
1000.0000 mg | Freq: Once | INTRAVENOUS | Status: AC
  Administered 2022-06-18: 1000 mg via INTRAVENOUS

## 2022-06-18 MED ORDER — OXYCODONE HCL 5 MG PO TABS
ORAL_TABLET | ORAL | Status: AC
  Filled 2022-06-18: qty 2

## 2022-06-18 MED ORDER — TIZANIDINE HCL 2 MG PO TABS: 2.0000 mg | ORAL_TABLET | Freq: Four times a day (QID) | ORAL | 0 refills | Status: DC | PRN

## 2022-06-18 MED ORDER — TRANEXAMIC ACID-NACL 1000-0.7 MG/100ML-% IV SOLN
1000.0000 mg | INTRAVENOUS | Status: AC
  Administered 2022-06-18: 1000 mg via INTRAVENOUS
  Filled 2022-06-18: qty 100

## 2022-06-18 MED ORDER — MAGNESIUM OXIDE -MG SUPPLEMENT 400 (240 MG) MG PO TABS
400.0000 mg | ORAL_TABLET | ORAL | Status: DC
Start: 2022-06-18 — End: 2022-06-19
  Administered 2022-06-18: 400 mg via ORAL
  Filled 2022-06-18: qty 1

## 2022-06-18 MED ORDER — DOCUSATE SODIUM 100 MG PO CAPS: 100.0000 mg | ORAL_CAPSULE | Freq: Two times a day (BID) | ORAL | Status: DC

## 2022-06-18 MED ORDER — APIXABAN 2.5 MG PO TABS: 2.5000 mg | ORAL_TABLET | Freq: Two times a day (BID) | ORAL | 0 refills | Status: DC

## 2022-06-18 MED ORDER — ONDANSETRON HCL 4 MG/2ML IJ SOLN: 4.0000 mg | Freq: Four times a day (QID) | INTRAMUSCULAR | Status: DC | PRN

## 2022-06-18 MED ORDER — METOCLOPRAMIDE HCL 5 MG PO TABS: 5.0000 mg | ORAL_TABLET | Freq: Three times a day (TID) | ORAL | Status: DC | PRN

## 2022-06-18 MED ORDER — FOLIC ACID 1 MG PO TABS
1.0000 mg | ORAL_TABLET | Freq: Every day | ORAL | Status: DC
  Administered 2022-06-18 – 2022-06-19 (×2): 1 mg via ORAL
  Filled 2022-06-18 (×2): qty 1

## 2022-06-18 MED ORDER — ADULT MULTIVITAMIN W/MINERALS CH
1.0000 | ORAL_TABLET | Freq: Every day | ORAL | Status: DC
  Administered 2022-06-18 – 2022-06-19 (×2): 1 via ORAL
  Filled 2022-06-18 (×2): qty 1

## 2022-06-18 MED ORDER — FLUTICASONE PROPIONATE 50 MCG/ACT NA SUSP
1.0000 | Freq: Every day | NASAL | Status: DC | PRN
Start: 2022-06-18 — End: 2022-06-19
  Filled 2022-06-18: qty 16

## 2022-06-18 MED ORDER — TRANEXAMIC ACID 1000 MG/10ML IV SOLN
INTRAVENOUS | Status: DC | PRN
  Administered 2022-06-18: 2000 mg via TOPICAL

## 2022-06-18 MED ORDER — PHENOL 1.4 % MT LIQD: 1.0000 | OROMUCOSAL | Status: DC | PRN

## 2022-06-18 MED ORDER — LIDOCAINE 2% (20 MG/ML) 5 ML SYRINGE
INTRAMUSCULAR | Status: DC | PRN
  Administered 2022-06-18: 100 mg via INTRAVENOUS

## 2022-06-18 MED ORDER — CHOLECALCIFEROL 1.25 MG (50000 UT) PO TABS
1.0000 | ORAL_TABLET | ORAL | Status: DC
Start: 2022-06-18 — End: 2022-06-18

## 2022-06-18 MED ORDER — HYDROMORPHONE HCL 1 MG/ML IJ SOLN: 0.5000 mg | INTRAMUSCULAR | Status: DC | PRN

## 2022-06-18 MED ORDER — BUPIVACAINE LIPOSOME 1.3 % IJ SUSP
INTRAMUSCULAR | Status: DC | PRN
  Administered 2022-06-18: 20 mL

## 2022-06-18 MED ORDER — SPIRONOLACTONE 25 MG PO TABS
25.0000 mg | ORAL_TABLET | Freq: Every day | ORAL | Status: DC
Start: 2022-06-18 — End: 2022-06-19
  Administered 2022-06-18 – 2022-06-19 (×2): 25 mg via ORAL
  Filled 2022-06-18 (×2): qty 1

## 2022-06-18 MED ORDER — POVIDONE-IODINE 10 % EX SWAB: 2.0000 "application " | Freq: Once | CUTANEOUS | Status: DC

## 2022-06-18 MED ORDER — FERROUS SULFATE 325 (65 FE) MG PO TABS
ORAL_TABLET | Freq: Every day | ORAL | Status: DC
  Administered 2022-06-18 – 2022-06-19 (×2): 325 mg via ORAL
  Filled 2022-06-18 (×2): qty 1

## 2022-06-18 MED ORDER — CHLORHEXIDINE GLUCONATE 0.12 % MT SOLN
15.0000 mL | Freq: Once | OROMUCOSAL | Status: AC
  Administered 2022-06-18: 15 mL via OROMUCOSAL

## 2022-06-18 MED ORDER — FLEET ENEMA 7-19 GM/118ML RE ENEM: 1.0000 | ENEMA | Freq: Once | RECTAL | Status: DC | PRN

## 2022-06-18 MED ORDER — POTASSIUM CHLORIDE CRYS ER 20 MEQ PO TBCR: 20.0000 meq | EXTENDED_RELEASE_TABLET | Freq: Every evening | ORAL | Status: DC | PRN

## 2022-06-18 MED ORDER — POVIDONE-IODINE 10 % EX SWAB: Freq: Once | CUTANEOUS | Status: AC

## 2022-06-18 MED ORDER — ATORVASTATIN CALCIUM 40 MG PO TABS
40.0000 mg | ORAL_TABLET | Freq: Every day | ORAL | Status: DC
  Administered 2022-06-18 – 2022-06-19 (×2): 40 mg via ORAL
  Filled 2022-06-18 (×2): qty 1

## 2022-06-18 MED ORDER — MIDAZOLAM HCL 2 MG/2ML IJ SOLN
1.0000 mg | INTRAMUSCULAR | Status: DC
  Administered 2022-06-18: 1 mg via INTRAVENOUS
  Filled 2022-06-18: qty 2

## 2022-06-18 MED ORDER — VITAMIN D (ERGOCALCIFEROL) 1.25 MG (50000 UNIT) PO CAPS
50000.0000 [IU] | ORAL_CAPSULE | ORAL | Status: DC
Start: 2022-06-18 — End: 2022-06-18

## 2022-06-18 MED ORDER — DOCUSATE SODIUM 100 MG PO CAPS: 100.0000 mg | ORAL_CAPSULE | Freq: Two times a day (BID) | ORAL | 0 refills | Status: DC

## 2022-06-18 MED ORDER — BISACODYL 5 MG PO TBEC: 5.0000 mg | DELAYED_RELEASE_TABLET | Freq: Every day | ORAL | Status: DC | PRN

## 2022-06-18 MED ORDER — DEXAMETHASONE SODIUM PHOSPHATE 10 MG/ML IJ SOLN
INTRAMUSCULAR | Status: DC | PRN
  Administered 2022-06-18: 4 mg via INTRAVENOUS

## 2022-06-18 MED ORDER — FENTANYL CITRATE (PF) 100 MCG/2ML IJ SOLN
INTRAMUSCULAR | Status: AC
  Filled 2022-06-18: qty 2

## 2022-06-18 MED ORDER — BUPIVACAINE-EPINEPHRINE (PF) 0.5% -1:200000 IJ SOLN
INTRAMUSCULAR | Status: DC | PRN
  Administered 2022-06-18: 20 mL via PERINEURAL

## 2022-06-18 MED ORDER — BUPIVACAINE LIPOSOME 1.3 % IJ SUSP: 20.0000 mL | Freq: Once | INTRAMUSCULAR | Status: DC

## 2022-06-18 MED ORDER — OXYCODONE HCL 5 MG PO TABS
5.0000 mg | ORAL_TABLET | ORAL | Status: DC | PRN
  Administered 2022-06-18 – 2022-06-19 (×5): 10 mg via ORAL
  Filled 2022-06-18 (×4): qty 2

## 2022-06-18 MED ORDER — ONDANSETRON HCL 4 MG/2ML IJ SOLN
INTRAMUSCULAR | Status: DC | PRN
  Administered 2022-06-18: 4 mg via INTRAVENOUS

## 2022-06-18 MED ORDER — FENTANYL CITRATE PF 50 MCG/ML IJ SOSY
50.0000 ug | PREFILLED_SYRINGE | INTRAMUSCULAR | Status: DC
  Administered 2022-06-18: 50 ug via INTRAVENOUS
  Filled 2022-06-18: qty 2

## 2022-06-18 MED ORDER — FEBUXOSTAT 40 MG PO TABS
40.0000 mg | ORAL_TABLET | Freq: Every day | ORAL | Status: DC
Start: 2022-06-18 — End: 2022-06-19
  Administered 2022-06-18 – 2022-06-19 (×2): 40 mg via ORAL
  Filled 2022-06-18 (×2): qty 1

## 2022-06-18 MED ORDER — TRANEXAMIC ACID 1000 MG/10ML IV SOLN
2000.0000 mg | INTRAVENOUS | Status: DC
  Filled 2022-06-18: qty 20

## 2022-06-18 MED ORDER — ORAL CARE MOUTH RINSE: 15.0000 mL | Freq: Once | OROMUCOSAL | Status: AC

## 2022-06-18 MED ORDER — POLYETHYLENE GLYCOL 3350 17 G PO PACK: 17.0000 g | PACK | Freq: Two times a day (BID) | ORAL | 0 refills | Status: DC

## 2022-06-18 MED ORDER — PROPOFOL 1000 MG/100ML IV EMUL
INTRAVENOUS | Status: AC
  Filled 2022-06-18: qty 100

## 2022-06-18 MED ORDER — BUPIVACAINE-EPINEPHRINE (PF) 0.25% -1:200000 IJ SOLN
INTRAMUSCULAR | Status: AC
Start: 2022-06-18 — End: ?
  Filled 2022-06-18: qty 30

## 2022-06-18 MED ORDER — WATER FOR IRRIGATION, STERILE IR SOLN
Status: DC | PRN
  Administered 2022-06-18: 2000 mL

## 2022-06-18 MED ORDER — PANTOPRAZOLE SODIUM 40 MG PO TBEC
40.0000 mg | DELAYED_RELEASE_TABLET | Freq: Every day | ORAL | Status: DC
  Administered 2022-06-18 – 2022-06-19 (×2): 40 mg via ORAL
  Filled 2022-06-18 (×2): qty 1

## 2022-06-18 MED ORDER — POLYETHYLENE GLYCOL 3350 17 G PO PACK: 17.0000 g | PACK | Freq: Every day | ORAL | Status: DC | PRN

## 2022-06-18 MED ORDER — SODIUM CHLORIDE 0.9 % IR SOLN
Status: DC | PRN
  Administered 2022-06-18: 1000 mL

## 2022-06-18 MED ORDER — DIPHENHYDRAMINE HCL 12.5 MG/5ML PO ELIX: 12.5000 mg | ORAL_SOLUTION | ORAL | Status: DC | PRN

## 2022-06-18 MED ORDER — HYDROMORPHONE HCL 2 MG/ML IJ SOLN
INTRAMUSCULAR | Status: AC
  Filled 2022-06-18: qty 1

## 2022-06-18 MED ORDER — KCL IN DEXTROSE-NACL 20-5-0.45 MEQ/L-%-% IV SOLN
INTRAVENOUS | Status: DC
  Filled 2022-06-18 (×2): qty 1000

## 2022-06-18 MED ORDER — BUPIVACAINE-EPINEPHRINE (PF) 0.25% -1:200000 IJ SOLN
INTRAMUSCULAR | Status: DC | PRN
  Administered 2022-06-18: 30 mL via PERINEURAL

## 2022-06-18 MED ORDER — SODIUM CHLORIDE (PF) 0.9 % IJ SOLN
INTRAMUSCULAR | Status: DC | PRN
  Administered 2022-06-18: 50 mL via INTRAVENOUS

## 2022-06-18 SURGICAL SUPPLY — 56 items
ADH SKN CLS APL DERMABOND .7 (GAUZE/BANDAGES/DRESSINGS) ×1
ATTUNE MED DOME PAT 41 KNEE (Knees) ×1 IMPLANT
ATTUNE PS FEM RT SZ 8 CEM KNEE (Femur) ×1 IMPLANT
ATTUNE PSRP INSR SZ8 5 KNEE (Insert) ×1 IMPLANT
BAG COUNTER SPONGE SURGICOUNT (BAG) ×1 IMPLANT
BAG DECANTER FOR FLEXI CONT (MISCELLANEOUS) ×2 IMPLANT
BAG SPEC THK2 15X12 ZIP CLS (MISCELLANEOUS) ×1
BAG SPNG CNTER NS LX DISP (BAG) ×1
BAG ZIPLOCK 12X15 (MISCELLANEOUS) ×2 IMPLANT
BASE TIBIAL ATTUNE KNEE SZ9 (Knees) IMPLANT
BLADE SAG 18X100X1.27 (BLADE) ×2 IMPLANT
BLADE SAW SGTL 11.0X1.19X90.0M (BLADE) ×2 IMPLANT
BLADE SURG SZ10 CARB STEEL (BLADE) ×4 IMPLANT
BNDG CMPR MED 10X6 ELC LF (GAUZE/BANDAGES/DRESSINGS) ×1
BNDG ELASTIC 6X10 VLCR STRL LF (GAUZE/BANDAGES/DRESSINGS) ×2 IMPLANT
BOWL SMART MIX CTS (DISPOSABLE) ×2 IMPLANT
BSPLAT TIB 9 CMNT ROT PLAT STR (Knees) ×1 IMPLANT
CEMENT HV SMART SET (Cement) ×4 IMPLANT
COVER SURGICAL LIGHT HANDLE (MISCELLANEOUS) ×2 IMPLANT
CUFF TOURN SGL QUICK 34 (TOURNIQUET CUFF) ×2
CUFF TRNQT CYL 34X4.125X (TOURNIQUET CUFF) ×1 IMPLANT
DERMABOND ADVANCED (GAUZE/BANDAGES/DRESSINGS) ×1
DERMABOND ADVANCED .7 DNX12 (GAUZE/BANDAGES/DRESSINGS) IMPLANT
DRAPE INCISE IOBAN 66X45 STRL (DRAPES) ×2 IMPLANT
DRAPE U-SHAPE 47X51 STRL (DRAPES) ×2 IMPLANT
DRSG AQUACEL AG ADV 3.5X10 (GAUZE/BANDAGES/DRESSINGS) ×2 IMPLANT
DURAPREP 26ML APPLICATOR (WOUND CARE) ×2 IMPLANT
ELECT REM PT RETURN 15FT ADLT (MISCELLANEOUS) ×2 IMPLANT
GLOVE BIO SURGEON STRL SZ7.5 (GLOVE) ×2 IMPLANT
GLOVE BIO SURGEON STRL SZ8.5 (GLOVE) ×2 IMPLANT
GLOVE BIOGEL PI IND STRL 8 (GLOVE) ×1 IMPLANT
GLOVE BIOGEL PI IND STRL 9 (GLOVE) ×1 IMPLANT
GLOVE BIOGEL PI INDICATOR 8 (GLOVE) ×1
GLOVE BIOGEL PI INDICATOR 9 (GLOVE) ×1
GOWN STRL REUS W/ TWL XL LVL3 (GOWN DISPOSABLE) ×2 IMPLANT
GOWN STRL REUS W/TWL XL LVL3 (GOWN DISPOSABLE) ×4
HANDPIECE INTERPULSE COAX TIP (DISPOSABLE) ×2
HOOD PEEL AWAY FLYTE STAYCOOL (MISCELLANEOUS) ×6 IMPLANT
KIT TURNOVER KIT A (KITS) ×1 IMPLANT
NDL HYPO 21X1.5 SAFETY (NEEDLE) ×2 IMPLANT
NEEDLE HYPO 21X1.5 SAFETY (NEEDLE) ×4 IMPLANT
NS IRRIG 1000ML POUR BTL (IV SOLUTION) ×2 IMPLANT
PACK TOTAL KNEE CUSTOM (KITS) ×2 IMPLANT
PROTECTOR NERVE ULNAR (MISCELLANEOUS) ×2 IMPLANT
SET HNDPC FAN SPRY TIP SCT (DISPOSABLE) ×1 IMPLANT
SPIKE FLUID TRANSFER (MISCELLANEOUS) ×4 IMPLANT
SUT VIC AB 1 CTX 36 (SUTURE) ×2
SUT VIC AB 1 CTX36XBRD ANBCTR (SUTURE) ×1 IMPLANT
SUT VIC AB 3-0 CT1 27 (SUTURE) ×6
SUT VIC AB 3-0 CT1 TAPERPNT 27 (SUTURE) ×3 IMPLANT
SYR CONTROL 10ML LL (SYRINGE) ×4 IMPLANT
TIBIAL BASE ATTUNE KNEE SZ9 (Knees) ×2 IMPLANT
TRAY CATH INTERMITTENT SS 16FR (CATHETERS) IMPLANT
TRAY FOLEY MTR SLVR 16FR STAT (SET/KITS/TRAYS/PACK) ×2 IMPLANT
WATER STERILE IRR 1000ML POUR (IV SOLUTION) ×4 IMPLANT
WRAP KNEE MAXI GEL POST OP (GAUZE/BANDAGES/DRESSINGS) ×2 IMPLANT

## 2022-06-18 NOTE — Telephone Encounter (Signed)
Patient was scheduled for and completed right total knee surgery with Dr Turner Daniels today and is currently hospitalized.  Will follow up with patient tomorrow via telephone.  Last labs 06/05/22 per epic review.  Serum creatinine elevated stable from May 2023.  GFR estimated greater than 60.  CBC WNL 06/05/22. Had follow up with rheumatology 05/31/22.

## 2022-06-18 NOTE — Discharge Instructions (Signed)

## 2022-06-18 NOTE — Anesthesia Postprocedure Evaluation (Signed)
Anesthesia Post Note  Patient: Ryan Glass  Procedure(s) Performed: RIGHT TOTAL KNEE ARTHROPLASTY (Right: Knee)     Anesthesia Post Evaluation No notable events documented.  Last Vitals:  Vitals:   06/18/22 1215 06/18/22 1230  BP: (!) 138/114 (!) 139/107  Pulse: 89 89  Resp: 12 16  Temp:    SpO2: 98% 97%    Last Pain:  Vitals:   06/18/22 1230  TempSrc:   PainSc: 0-No pain                 Gaither Biehn

## 2022-06-18 NOTE — Progress Notes (Signed)
AssistedDr. Dorris Singh with right, adductor canal block. Side rails up, monitors on throughout procedure. See vital signs in flow sheet. Tolerated Procedure well.

## 2022-06-18 NOTE — Op Note (Signed)
PATIENT ID:      Ryan Glass  MRN:     [id number] DOB/AGE:    04/09/1956 / 66 y.o.       OPERATIVE REPORT   DATE OF PROCEDURE:  06/18/2022      PREOPERATIVE DIAGNOSIS:   RIGHT KNEE OSTEOARTHRITIS      Estimated body mass index is 35.12 kg/m as calculated from the following:   Height as of this encounter: 5\' 8"  (1.727 m).   Weight as of this encounter: 104.8 kg.                                                       POSTOPERATIVE DIAGNOSIS:   Same                                                                  PROCEDURE:  Procedure(s): RIGHT TOTAL KNEE ARTHROPLASTY Using DepuyAttune RP implants #(R Femur, #8Tibia, 5 mm Attune RP bearing, 41 Patella    SURGEON: Kerin Salen  ASSISTANT:   Kerry Hough. [employer]   (Present and scrubbed throughout the case, critical for assistance with exposure, retraction, instrumentation, and closure.)        ANESTHESIA: Spinal, 20cc Exparel, 50cc 0.25% Marcaine EBL: 400 cc FLUID REPLACEMENT: 1600 cc crystaloid TOURNIQUET: DRAINS: None TRANEXAMIC ACID: 1gm IV, 2gm topical COMPLICATIONS:  None         INDICATIONS FOR PROCEDURE: The patient has  RIGHT KNEE OSTEOARTHRITIS, Var deformities, XR shows bone on bone arthritis, lateral subluxation of tibia. Patient has failed all conservative measures including anti-inflammatory medicines, narcotics, attempts at exercise and weight loss, cortisone injections and viscosupplementation.  Risks and benefits of surgery have been discussed, questions answered.   DESCRIPTION OF PROCEDURE: The patient identified by armband, received  IV antibiotics, in the holding area at [facility]. Patient taken to the operating room, appropriate anesthetic monitors were attached, and Spinal anesthesia was  induced. IV Tranexamic acid was given.Tourniquet applied high to the operative thigh. Lateral post and foot positioner applied to the table, the lower extremity was then prepped and draped in usual sterile fashion from the  toes to the tourniquet. Time-out procedure was performed. Kerry Hough. [facility] PAC, was present and scrubbed throughout the case, critical for assistance with, positioning, exposure, retraction, instrumentation, and closure.The skin and subcutaneous tissue along the incision was injected with 20 cc of a mixture of Exparel and Marcaine solution, using a 20-gauge by 1-1/2 inch needle. We began the operation, with the knee flexed 130 degrees, by making the anterior midline incision starting at handbreadth above the patella going over the patella 1 cm medial to and 4 cm distal to the tibial tubercle. Small bleeders in the skin and the subcutaneous tissue identified and cauterized. Transverse retinaculum was incised and reflected medially and a medial parapatellar arthrotomy was accomplished. the patella was everted and theprepatellar fat pad resected. The superficial medial collateral ligament was then elevated from anterior to posterior along the proximal flare of the tibia and anterior half of the menisci resected. The knee was hyperflexed exposing bone on bone arthritis. Peripheral  and notch osteophytes as well as the cruciate ligaments were then resected. We continued to work our way around posteriorly along the proximal tibia, and externally rotated the tibia subluxing it out from underneath the femur. A McHale PCL retractor was placed through the notch and a lateral Hohmann retractor placed, and we then entered the proximal tibia in line with the Depuy starter drill in line with the axis of the tibia followed by an intramedullary guide rod and 0-degree posterior slope cutting guide. The tibial cutting guide, 4 degree posterior sloped, was pinned into place allowing resection of 2 mm of bone medially and 11 mm of bone laterally. Satisfied with the tibial resection, we then entered the distal femur 2 mm anterior to the PCL origin with the intramedullary guide rod and applied the distal femoral cutting guide set at 9 mm,  with 5 degrees of valgus. This was pinned along the epicondylar axis. At this point, the distal femoral cut was accomplished without difficulty. We then sized for a #9R femoral component and pinned the guide in 3 degrees of external rotation. The chamfer cutting guide was pinned into place. The anterior, posterior, and chamfer cuts were accomplished without difficulty followed by the Attune RP box cutting guide and the box cut. We also removed posterior osteophytes from the posterior femoral condyles. The posterior capsule was injected with Exparel solution. The knee was brought into full extension. We checked our extension gap and fit a 5 mm bearing. Distracting in extension with a lamina spreader,  bleeders in the posterior capsule, Posterior medial and posterior lateral gutter were cauterized.  The transexamic acid-soaked sponge was then placed in the gap of the knee in extension. The knee was flexed 30. The posterior patella cut was accomplished with the 9.5 mm Attune cutting guide, sized for a 51mm dome, and the fixation pegs drilled.The knee was then once again hyperflexed exposing the proximal tibia. We sized for a # 9 tibial base plate, applied the smokestack and the conical reamer followed by the the Delta fin keel punch. We then hammered into place the Attune RP trial femoral component, drilled the lugs, inserted a  5 mm trial bearing, trial patellar button, and took the knee through range of motion from 0-130 degrees. Medial and lateral ligamentous stability was checked. No thumb pressure was required for patellar Tracking. The tourniquet was not used. All trial components were removed, mating surfaces irrigated with pulse lavage, and dried with suction and sponges. 10 cc of the Exparel solution was applied to the cancellus bone of the patella distal femur and proximal tibia.  After waiting 30 seconds, the bony surfaces were again, dried with sponges. A double batch of DePuy HV cement was mixed and  applied to all bony metallic mating surfaces except for the posterior condyles of the femur itself. In order, we hammered into place the tibial tray and removed excess cement, the femoral component and removed excess cement. The final Attune RP bearing was inserted, and the knee brought to full extension with compression. The patellar button was clamped into place, and excess cement removed. The knee was held at 30 flexion with compression, while the cement cured. The wound was irrigated out with normal saline solution pulse lavage. The rest of the Exparel was injected into the parapatellar arthrotomy, subcutaneous tissues, and periosteal tissues. The parapatellar arthrotomy was closed with running #1 Vicryl suture. The subcutaneous tissue with 3-0 undyed Vicryl suture, and the skin with running 3-0 SQ vicryl. An Aquacil and  Ace wrap were applied. The patient was taken to recovery room without difficulty.   [name] 06/18/2022, 11:05 AM

## 2022-06-18 NOTE — Anesthesia Procedure Notes (Addendum)
Anesthesia Regional Block: Adductor canal block   Pre-Anesthetic Checklist: , timeout performed,  Correct Patient, Correct Site, Correct Laterality,  Correct Procedure, Correct Position, site marked,  Risks and benefits discussed,  Surgical consent,  Pre-op evaluation,  At surgeon's request and post-op pain management  Laterality: Right  Prep: chloraprep       Needles:  Injection technique: Single-shot  Needle Type: Stimulator Needle - 80          Additional Needles:   Procedures: Doppler guided,,,, ultrasound used (permanent image in chart),,    Narrative:  Start time: 06/18/2022 9:05 AM End time: 06/18/2022 9:20 AM Injection made incrementally with aspirations every 5 mL. Anesthesiologist: Dorris Singh, MD

## 2022-06-18 NOTE — Transfer of Care (Signed)
Immediate Anesthesia Transfer of Care Note  Patient: CATHAN GEARIN  Procedure(s) Performed: RIGHT TOTAL KNEE ARTHROPLASTY (Right: Knee)  Patient Location: PACU  Anesthesia Type:General  Level of Consciousness: patient cooperative  Airway & Oxygen Therapy: Patient Spontanous Breathing and Patient connected to nasal cannula oxygen  Post-op Assessment: Report given to RN and Post -op Vital signs reviewed and stable  Post vital signs: Reviewed and stable  Last Vitals:  Vitals Value Taken Time  BP 139/106 06/18/22 1154  Temp    Pulse 90 06/18/22 1156  Resp 12 06/18/22 1156  SpO2 99 % 06/18/22 1156  Vitals shown include unvalidated device data.  Last Pain:  Vitals:   06/18/22 0754  TempSrc:   PainSc: 0-No pain      Patients Stated Pain Goal: 4 (06/18/22 0754)  Complications: No notable events documented.

## 2022-06-18 NOTE — Evaluation (Signed)
Physical Therapy Evaluation Patient Details Name: Ryan Glass MRN: [id number] DOB: 1956-02-09 Today's Date: 06/18/2022  History of Present Illness  Pt is a 66yo male presenting s/p R-TKA on 06/18/22. 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   Clinical Impression  Ryan Glass is a 66 y.o. male POD 0 s/p R-TKA. OF NOTE: pt with recent L-TKA on 04/30/22, reports L knee is recovering well. Patient reports modified independence with mobility at baseline. Patient is now limited by functional impairments (see PT problem list below) and requires min assist for bed mobility and min guard for transfers. Patient was able to ambulate 30 feet with RW and min guard level of assist. Patient instructed in exercise to facilitate ROM and circulation to manage edema. Provided incentive spirometer and with Vcs pt able to achieve . Patient will benefit from continued skilled PT interventions to address impairments and progress towards PLOF. Acute PT will follow to progress mobility and stair training in preparation for safe discharge home.       Recommendations for follow up therapy are one component of a multi-disciplinary discharge planning process, led by the attending physician.  Recommendations may be updated based on patient status, additional functional criteria and insurance authorization.  Follow Up Recommendations Follow physician's recommendations for discharge plan and follow up therapies      Assistance Recommended at Discharge Set up Supervision/Assistance  Patient can return home with the following  A little help with walking and/or transfers;A little help with bathing/dressing/bathroom;Assistance with cooking/housework;Assist for transportation;Help with stairs or ramp for entrance    Equipment Recommendations None recommended by PT (Pt has recommended DME)  Recommendations for Other Services        Functional Status Assessment Patient has had a recent decline in their functional status and demonstrates the ability to make significant improvements in function in a reasonable and predictable amount of time.     Precautions / Restrictions Precautions Precautions: Fall Precaution Comments: instructed no pillow under knee Restrictions Weight Bearing Restrictions: No Other Position/Activity Restrictions: WBAT      Mobility  Bed Mobility Overal bed mobility: Needs Assistance Bed Mobility: Supine to Sit     Supine to sit: Min assist     General bed mobility comments: Min assist to bring RLE off bed.    Transfers Overall transfer level: Needs assistance Equipment used: Rolling walker (2 wheels) Transfers: Sit to/from Stand Sit to Stand: Min guard           General transfer comment: Min guard for safety only, no physical assist required.    Ambulation/Gait Ambulation/Gait assistance: Min guard, +2 safety/equipment Gait Distance (Feet): 30 Feet Assistive device: Rolling walker (2 wheels) Gait Pattern/deviations: Step-to pattern, Decreased stance time - right, Trunk flexed Gait velocity: decreased     General Gait Details: Pt ambulated with RW and min guard, no physical assist provided or overt LOB noted, +2 for recliner follow for safety. Pt ambulated from bed to toilet to hallway.  Stairs            Wheelchair Mobility    Modified Rankin (Stroke Patients Only)       Balance Overall balance assessment: Needs assistance Sitting-balance support: Feet supported, No upper extremity supported Sitting balance-Leahy Scale: Fair     Standing balance support: Reliant on assistive device for balance, During functional activity, Bilateral upper extremity supported Standing balance-Leahy Scale: Poor  Pertinent Vitals/Pain Pain Assessment Pain Assessment: 0-10 Pain Score: 6  Pain Location: R knee Pain Descriptors /  Indicators: Operative site guarding, Guarding Pain Intervention(s): Limited activity within patient's tolerance, Monitored during session, Repositioned, Ice applied    Home Living Family/patient expects to be discharged to:: Private residence Living Arrangements: Spouse/significant other Available Help at Discharge: Family;Available 24 hours/day Type of Home: House Home Access: Stairs to enter Entrance Stairs-Rails: Left;Can reach Therapist, art of Steps: 5-6 Alternate Level Stairs-Number of Steps: Flight Home Layout: Multi-level (Sleeps on the first floor, bathroom is on the third floor.) Home Equipment: Grab bars - tub/shower;Cane - single Librarian, academic (2 wheels) Additional Comments: one railing to the third floor that has been pulled out previously but has since been repaired but may not be secure    Prior Function Prior Level of Function : Independent/Modified Independent             Mobility Comments: SPC while in pain, RW if out in the community ADLs Comments: IND     Hand Dominance   Dominant Hand: Right    Extremity/Trunk Assessment   Upper Extremity Assessment Upper Extremity Assessment: Overall WFL for tasks assessed    Lower Extremity Assessment Lower Extremity Assessment: RLE deficits/detail;LLE deficits/detail RLE Deficits / Details: MMT ank DF/PF 5/5, no extensor lag noted RLE Sensation: WNL LLE Deficits / Details: MMT ank DF/PF 5/5 LLE Sensation: WNL    Cervical / Trunk Assessment Cervical / Trunk Assessment: Normal  Communication   Communication: No difficulties  Cognition Arousal/Alertness: Awake/alert Behavior During Therapy: WFL for tasks assessed/performed Overall Cognitive Status: Within Functional Limits for tasks assessed                                          General Comments General comments (skin integrity, edema, etc.): Wife Noreene Larsson and daughters present for session    Exercises Total  Joint Exercises Ankle Circles/Pumps: AROM, 20 reps, Both   Assessment/Plan    PT Assessment Patient needs continued PT services  PT Problem List Decreased strength;Decreased range of motion;Decreased activity tolerance;Decreased balance;Decreased mobility;Decreased coordination;Pain       PT Treatment Interventions DME instruction;Gait training;Stair training;Functional mobility training;Therapeutic activities;Therapeutic exercise;Balance training;Neuromuscular re-education;Patient/family education    PT Goals (Current goals can be found in the Care Plan section)  Acute Rehab PT Goals Patient Stated Goal: walk without pain PT Goal Formulation: With patient Time For Goal Achievement: 06/25/22 Potential to Achieve Goals: Good    Frequency 7X/week     Co-evaluation               AM-PAC PT "6 Clicks" Mobility  Outcome Measure Help needed turning from your back to your side while in a flat bed without using bedrails?: None Help needed moving from lying on your back to sitting on the side of a flat bed without using bedrails?: A Little Help needed moving to and from a bed to a chair (including a wheelchair)?: A Little Help needed standing up from a chair using your arms (e.g., wheelchair or bedside chair)?: A Little Help needed to walk in hospital room?: A Little Help needed climbing 3-5 steps with a railing? : A Little 6 Click Score: 19    End of Session Equipment Utilized During Treatment: Gait belt Activity Tolerance: Patient tolerated treatment well Patient left: in chair;with call bell/phone within reach;with chair alarm set;with family/visitor present;with SCD's  reapplied Nurse Communication: Mobility status PT Visit Diagnosis: Pain;Difficulty in walking, not elsewhere classified (R26.2) Pain - Right/Left: Right Pain - part of body: Knee    Time: 2409-7353 PT Time Calculation (min) (ACUTE ONLY): 19 min   Charges:   PT Evaluation $PT Eval Low Complexity: 1 Low         Jamesetta Geralds, PT, DPT [facility] Department Office: [phone] Pager: [phone]  Jamesetta Geralds 06/18/2022, 6:09 PM

## 2022-06-18 NOTE — Anesthesia Procedure Notes (Signed)
Procedure Name: LMA Insertion Date/Time: 06/18/2022 9:35 AM  Performed by: Ezekiel Ina, CRNAPre-anesthesia Checklist: Patient identified, Emergency Drugs available, Suction available and Patient being monitored Patient Re-evaluated:Patient Re-evaluated prior to induction Oxygen Delivery Method: Circle system utilized Preoxygenation: Pre-oxygenation with 100% oxygen Induction Type: IV induction Ventilation: Mask ventilation without difficulty LMA: LMA inserted and LMA with gastric port inserted LMA Size: 4.0 Number of attempts: 1 Tube secured with: Tape Dental Injury: Teeth and Oropharynx as per pre-operative assessment

## 2022-06-19 ENCOUNTER — Encounter (HOSPITAL_COMMUNITY): Payer: Self-pay | Admitting: Orthopedic Surgery

## 2022-06-19 MED ORDER — OXYCODONE HCL 5 MG PO TABS: 5.0000 mg | ORAL_TABLET | ORAL | 0 refills | Status: DC | PRN

## 2022-06-19 NOTE — Progress Notes (Signed)
Physical Therapy Treatment Patient Details Name: Ryan Glass MRN: [id number] DOB: [birth year] Today's Date: 06/19/2022   History of Present Illness Pt is a 66yo male presenting s/p R-TKA on 06/18/22. 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    PT Comments    Pt received in room ambulating with RW to and from bathroom and packing his things for d/c home this afternoon. Educated pt on safety awareness and fall prevention as well as not performing any pivoting movements on R LE with good understanding. Focused session on continued gait training and answering questions/concerns prior to returning home. Pt has received TKA HEP and will complete independently until starting out pt PT. Pt cleared functionally for d/c home with wife.    Recommendations for follow up therapy are one component of a multi-disciplinary discharge planning process, led by the attending physician.  Recommendations may be updated based on patient status, additional functional criteria and insurance authorization.  Follow Up Recommendations  Follow physician's recommendations for discharge plan and follow up therapies     Assistance Recommended at Discharge Set up Supervision/Assistance  Patient can return home with the following A little help with walking and/or transfers;A little help with bathing/dressing/bathroom;Assistance with cooking/housework;Assist for transportation;Help with stairs or ramp for entrance   Equipment Recommendations  None recommended by PT    Recommendations for Other Services       Precautions / Restrictions Precautions Precautions: Fall Precaution Comments: instructed no pillow under knee Restrictions Weight Bearing Restrictions: No RLE Weight Bearing: Weight bearing as tolerated     Mobility  Bed Mobility Overal bed mobility: Needs Assistance Bed Mobility: Supine to Sit     Supine to sit:  Supervision     General bed mobility comments:  (Able to maneuver R LE without assist)    Transfers Overall transfer level: Needs assistance Equipment used: Rolling walker (2 wheels) Transfers: Sit to/from Stand, Bed to chair/wheelchair/BSC Sit to Stand: Supervision, Modified independent (Device/Increase time)   Step pivot transfers: Supervision, Modified independent (Device/Increase time)       General transfer comment: Improved safe mobility    Ambulation/Gait Ambulation/Gait assistance: Supervision, Modified independent (Device/Increase time) Gait Distance (Feet): 180 Feet Assistive device: Rolling walker (2 wheels) Gait Pattern/deviations: Step-through pattern, Decreased weight shift to right       General Gait Details: Improved normalized gait pattern   Stairs Stairs: Yes Stairs assistance: Supervision Stair Management: Two rails, Step to pattern, Forwards Number of Stairs: 10 General stair comments: Good demonstration of proper sequencing   Wheelchair Mobility    Modified Rankin (Stroke Patients Only)       Balance Overall balance assessment: Needs assistance Sitting-balance support: Feet supported, No upper extremity supported Sitting balance-Leahy Scale: Good     Standing balance support: Reliant on assistive device for balance, During functional activity, Bilateral upper extremity supported Standing balance-Leahy Scale: Fair                              Cognition Arousal/Alertness: Awake/alert Behavior During Therapy: WFL for tasks assessed/performed Overall Cognitive Status: Within Functional Limits for tasks assessed                                 General Comments: Very pleasant        Exercises Total Joint  Exercises Ankle Circles/Pumps: AROM, 20 reps, Both Quad Sets: AROM, Both, 10 reps Long Arc Quad: AROM, Right, 10 reps Knee Flexion: AROM, Right, Seated Goniometric ROM: 0-100    General Comments General  comments (skin integrity, edema, etc.): Pt educated on benefits of mobility, applying ice, and completing HEP      Pertinent Vitals/Pain Pain Assessment Pain Assessment: 0-10 Pain Score: 4  Pain Location: R knee Pain Descriptors / Indicators: Aching, Discomfort, Guarding Pain Intervention(s): Monitored during session, Ice applied    Home Living                          Prior Function            PT Goals (current goals can now be found in the care plan section) Acute Rehab PT Goals Patient Stated Goal: walk without pain Progress towards PT goals: Progressing toward goals    Frequency    7X/week      PT Plan Current plan remains appropriate    Co-evaluation              AM-PAC PT "6 Clicks" Mobility   Outcome Measure  Help needed turning from your back to your side while in a flat bed without using bedrails?: None Help needed moving from lying on your back to sitting on the side of a flat bed without using bedrails?: None Help needed moving to and from a bed to a chair (including a wheelchair)?: None Help needed standing up from a chair using your arms (e.g., wheelchair or bedside chair)?: None Help needed to walk in hospital room?: A Little Help needed climbing 3-5 steps with a railing? : A Little 6 Click Score: 22    End of Session Equipment Utilized During Treatment: Gait belt Activity Tolerance: Patient tolerated treatment well Patient left: in chair;with call bell/phone within reach;with chair alarm set;with family/visitor present;with SCD's reapplied Nurse Communication: Mobility status PT Visit Diagnosis: Pain;Difficulty in walking, not elsewhere classified (R26.2) Pain - Right/Left: Right Pain - part of body: Knee     Time: 0867-6195 PT Time Calculation (min) (ACUTE ONLY): 19 min  Charges:  $Gait Training: 8-22 mins $Therapeutic Exercise: 8-22 mins $Therapeutic Activity: 8-22 mins                    Zadie Cleverly, PTA   Jannet Askew 06/19/2022, 2:56 PM

## 2022-06-19 NOTE — Progress Notes (Signed)
PATIENT ID: Ryan Glass  MRN: [id number]  DOB/AGE:  04/14/1956 / 66 y.o.  1 Day Post-Op Procedure(s) (LRB): RIGHT TOTAL KNEE ARTHROPLASTY (Right)    PROGRESS NOTE Subjective: Patient is alert, oriented, no Nausea, no Vomiting, yes passing gas. Taking PO well. Denies SOB, Chest or Calf Pain. Using Incentive Spirometer, PAS in place. Ambulate 30', Patient reports pain as 3/10 .    Objective: Vital signs in last 24 hours: Vitals:   06/18/22 1802 06/18/22 2020 06/19/22 0222 06/19/22 0645  BP: (!) 140/94 129/89 136/89 135/85  Pulse: 94 90 90 89  Resp: 18 17 17 17   Temp: 97.9 F (36.6 C) (!) 97.5 F (36.4 C) 97.8 F (36.6 C) 97.6 F (36.4 C)  TempSrc: Oral Oral Oral   SpO2: 93% 94% 94% 96%  Weight:      Height:          Intake/Output from previous day: I/O last 3 completed shifts: In: 2817.8 [P.O.:1080; I.V.:1537.8; IV Piggyback:200] Out: 1675 [Urine:1375; Blood:300]   Intake/Output this shift: No intake/output data recorded.   LABORATORY DATA: No results for input(s): "WBC", "HGB", "HCT", "PLT", "NA", "K", "CL", "CO2", "BUN", "CREATININE", "GLUCOSE", "GLUCAP", "INR", "CALCIUM" in the last 72 hours.  Invalid input(s): "PT", "2"  Examination: Neurologically intact ABD soft Neurovascular intact Sensation intact distally Intact pulses distally Dorsiflexion/Plantar flexion intact Incision: scant drainage No cellulitis present Compartment soft}  Assessment:   1 Day Post-Op Procedure(s) (LRB): RIGHT TOTAL KNEE ARTHROPLASTY (Right) ADDITIONAL DIAGNOSIS: Expected Acute Blood Loss Anemia, History of ventricular arrhythmia, history of rheumatoid arthritis, history of pulmonary embolism,History of gout, coronary artery disease.  Patient's anticipated LOS is less than 2 midnights, meeting these requirements: - Younger than 69 - Lives within 1 hour of care - Has a competent adult at home to recover with post-op recover - NO history of  - Chronic pain requiring opiods  -  Diabetes  - Coronary Artery Disease  - Heart failure  - Heart attack  - Stroke  - DVT/VTE  - Cardiac arrhythmia  - Respiratory Failure/COPD  - Renal failure  - Anemia  - Advanced Liver disease     Plan: PT/OT WBAT, AROM and PROM  DVT Prophylaxis:  SCDx72hrs, ASA 81 mg BID x 2 weeks DISCHARGE PLAN: Home, Today he passes physical therapy. DISCHARGE NEEDS: HHPT, Walker, and 3-in-1 comode seat     76 06/19/2022, 7:34 AM  Patient ID: Ryan Glass, male   DOB: 1956-08-05, 66 y.o.   MRN: [id number]

## 2022-06-19 NOTE — Progress Notes (Signed)
Physical Therapy Treatment Patient Details Name: Ryan Glass MRN: [id number] DOB: [birth year] Today's Date: 06/19/2022   History of Present Illness Pt is a 66yo male presenting s/p R-TKA on 06/18/22. 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    PT Comments    Pt seen this am, received in supine, able to transfer to edge of bed without assistance. Sit<>stand from various surfaces with S/ModI, gait with RW 100+ feet with supervision. Pt able to negotiate 10 steps with B hand rails and supervision. HEP reviewed as well as proper positioning and safety at home with good understanding. Will see pt again in pm for continued functional mobility progression.   Recommendations for follow up therapy are one component of a multi-disciplinary discharge planning process, led by the attending physician.  Recommendations may be updated based on patient status, additional functional criteria and insurance authorization.  Follow Up Recommendations  Follow physician's recommendations for discharge plan and follow up therapies     Assistance Recommended at Discharge Set up Supervision/Assistance  Patient can return home with the following A little help with walking and/or transfers;A little help with bathing/dressing/bathroom;Assistance with cooking/housework;Assist for transportation;Help with stairs or ramp for entrance   Equipment Recommendations  None recommended by PT    Recommendations for Other Services       Precautions / Restrictions Precautions Precautions: Fall Precaution Comments: instructed no pillow under knee Restrictions Weight Bearing Restrictions: No RLE Weight Bearing: Weight bearing as tolerated     Mobility  Bed Mobility Overal bed mobility: Needs Assistance Bed Mobility: Supine to Sit     Supine to sit: Supervision     General bed mobility comments:  (Able to maneuver R LE without  assist)    Transfers Overall transfer level: Needs assistance Equipment used: Rolling walker (2 wheels) Transfers: Sit to/from Stand Sit to Stand: Supervision   Step pivot transfers: Supervision       General transfer comment:  (Good demonstration of safety awareness)    Ambulation/Gait Ambulation/Gait assistance: Supervision Gait Distance (Feet): 150 Feet Assistive device: Rolling walker (2 wheels) Gait Pattern/deviations: Step-through pattern, Decreased weight shift to right       General Gait Details: No LOB, good safety awareness   Stairs Stairs: Yes Stairs assistance: Supervision Stair Management: Two rails, Step to pattern, Forwards Number of Stairs: 10 General stair comments: Good demonstration of proper sequencing   Wheelchair Mobility    Modified Rankin (Stroke Patients Only)       Balance Overall balance assessment: Needs assistance Sitting-balance support: Feet supported, No upper extremity supported Sitting balance-Leahy Scale: Good     Standing balance support: Reliant on assistive device for balance, During functional activity, Bilateral upper extremity supported Standing balance-Leahy Scale: Fair                              Cognition Arousal/Alertness: Awake/alert Behavior During Therapy: WFL for tasks assessed/performed Overall Cognitive Status: Within Functional Limits for tasks assessed                                 General Comments: Very pleasant        Exercises Total Joint Exercises Ankle Circles/Pumps: AROM, 20 reps, Both Quad Sets: AROM, Both, 10 reps Long Arc Quad: AROM, Right, 10 reps Knee Flexion: AROM,  Right, Seated Goniometric ROM: 0-100    General Comments General comments (skin integrity, edema, etc.): Pt educated on benefits of mobility, applying ice, and completing HEP      Pertinent Vitals/Pain Pain Assessment Pain Assessment: 0-10 Pain Score: 5  Pain Location: R knee Pain  Descriptors / Indicators: Aching, Discomfort, Guarding Pain Intervention(s): Monitored during session, Premedicated before session, Ice applied    Home Living                          Prior Function            PT Goals (current goals can now be found in the care plan section) Acute Rehab PT Goals Patient Stated Goal: walk without pain Progress towards PT goals: Progressing toward goals    Frequency    7X/week      PT Plan Current plan remains appropriate    Co-evaluation              AM-PAC PT "6 Clicks" Mobility   Outcome Measure  Help needed turning from your back to your side while in a flat bed without using bedrails?: None Help needed moving from lying on your back to sitting on the side of a flat bed without using bedrails?: A Little Help needed moving to and from a bed to a chair (including a wheelchair)?: A Little Help needed standing up from a chair using your arms (e.g., wheelchair or bedside chair)?: A Little Help needed to walk in hospital room?: A Little Help needed climbing 3-5 steps with a railing? : A Little 6 Click Score: 19    End of Session Equipment Utilized During Treatment: Gait belt Activity Tolerance: Patient tolerated treatment well Patient left: in chair;with call bell/phone within reach;with chair alarm set;with family/visitor present;with SCD's reapplied Nurse Communication: Mobility status PT Visit Diagnosis: Pain;Difficulty in walking, not elsewhere classified (R26.2) Pain - Right/Left: Right Pain - part of body: Knee     Time: 6295-2841 PT Time Calculation (min) (ACUTE ONLY): 45 min  Charges:  $Gait Training: 8-22 mins $Therapeutic Exercise: 8-22 mins $Therapeutic Activity: 8-22 mins                    Zadie Cleverly, PTA   Jannet Askew 06/19/2022, 2:45 PM

## 2022-06-19 NOTE — TOC Transition Note (Signed)
Transition of Care [facility]) - CM/SW Discharge Note   Patient Details  Name: HINTON LUELLEN MRN: [id number] Date of Birth: 1956/05/05  Transition of Care [facility]) CM/SW Contact:  Darleene Cleaver, LCSW Phone Number: 06/19/2022, 1:22 PM   Clinical Narrative:     CSW was informed that patient already has equipment at home.  He does not need any other DME.  CSW contacted [facility] to see if they can accept patient for [facility] PT since they had him previously.  CSW spoke to Cindie and she said yes they will.  Patient will be going home with home health PT through [place]. CSW signing off please reconsult with any other social work needs, home health agency has been notified of planned discharge.    Final next level of care: Home w Home Health Services Barriers to Discharge: Barriers Resolved   Patient Goals and CMS Choice Patient states their goals for this hospitalization and ongoing recovery are:: To return home with home health. CMS Medicare.gov Compare Post Acute Care list provided to:: Patient Choice offered to / list presented to : Patient  Discharge Placement                       Discharge Plan and Services                          HH Arranged: PT [facility] Agency: [facility] Date [facility] Agency Contacted: 06/19/22 Time HH Agency Contacted: 1322 Representative spoke with at [employer] Agency: Cindie  Social Determinants of Health (SDOH) Interventions     Readmission Risk Interventions    10/12/2021    1:08 PM  Readmission Risk Prevention Plan  Transportation Screening Complete  Medication Review [employer]) Complete  PCP or Specialist appointment within 3-5 days of discharge Complete  HRI or Home Care Consult Complete  SW Recovery Care/Counseling Consult Complete  Palliative Care Screening Not Applicable  Skilled Nursing Facility Not Applicable

## 2022-06-19 NOTE — Progress Notes (Signed)
Orthopedic Tech Progress Note Patient Details:  Ryan Glass [birth year] [id number]  Ortho Devices Type of Ortho Device: Bone foam zero knee Ortho Device/Splint Location: RLE Ortho Device/Splint Interventions: Ordered, Application   Post Interventions Patient Tolerated: Well Instructions Provided: Care of device  Jae Dire 06/19/2022, 9:30 AM

## 2022-06-19 NOTE — Discharge Summary (Signed)
Patient ID: Ryan Glass MRN: [id number] DOB/AGE: [birth year] 66 y.o.  Admit date: 06/18/2022 Discharge date: 06/19/2022  Admission Diagnoses:  Principal Problem:   Osteoarthritis of right knee Active Problems:   Arthritis of right knee   Discharge Diagnoses:  Same  Past Medical History:  Diagnosis Date   AICD (automatic cardioverter/defibrillator) present    Ascending aortic aneurysm (HCC)    a. CT 05/2021 showed 4.4x4.4cm TAA. Stable on chest CTA 12/2021 at 4.4cm.   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 There is severe asymmetric left ventricular hypertrophy. G1DD present.    History of COVID-19 06/07/2021   HOCM (hypertrophic obstructive cardiomyopathy) (HCC)    s/p ICD 07/2021   Hypertension    followed by cardiology, dr t. turner   (05-14-2018 nuclear study in epic , normal perfusion with nuclear ef 61%)   Left hydrocele    Low serum potassium level 04/27/2014   OSA on CPAP    per pt uses every night   Palpitations followed by dr t. Mayford Knife   06-08-2020  still feels palipations due to PVCs when exertion but not with chest pain/ discomfort   PVC's (premature ventricular contractions) cardiologist--- dr t. turner   Status post PVC ablation by Dr. Ladona Ridgel 2019 with recurrence of frequent PVCs/bigeminy;  prior pseudobradycardia r/t pvcs   Rheumatoid arthritis involving multiple sites [facility])    rheumotology--- dr a. Sharmon Revere  ([facility]  in [place])    Surgeries: Procedure(s): RIGHT TOTAL KNEE ARTHROPLASTY on 06/18/2022   Consultants:   Discharged Condition: Improved  Hospital Course: Ryan Glass is an 66 y.o. male who was admitted 06/18/2022 for operative treatment ofOsteoarthritis of right knee. Patient has severe unremitting pain that affects sleep, daily activities, and work/hobbies. After pre-op clearance the patient was taken to the operating room on 06/18/2022 and underwent  Procedure(s): RIGHT TOTAL KNEE ARTHROPLASTY.    Patient was given perioperative antibiotics:  Anti-infectives (From admission, onward)    Start     Dose/Rate Route Frequency Ordered Stop   06/18/22 0730  ceFAZolin (ANCEF) IVPB 2g/100 mL premix        2 g 200 mL/hr over 30 Minutes Intravenous On call to O.R. 06/18/22 3220 06/18/22 2542        Patient was given sequential compression devices, early ambulation, and chemoprophylaxis to prevent DVT.  Patient benefited maximally from hospital stay and there were no complications.    Recent vital signs: Patient Vitals for the past 24 hrs:  BP Temp Temp src Pulse Resp SpO2  06/19/22 0930 (!) 138/91 -- -- 90 17 96 %  06/19/22 0756 -- -- -- -- -- 98 %  06/19/22 0645 135/85 97.6 F (36.4 C) -- 89 17 96 %  06/19/22 0222 136/89 97.8 F (36.6 C) Oral 90 17 94 %  06/18/22 2020 129/89 (!) 97.5 F (36.4 C) Oral 90 17  94 %  06/18/22 1802 (!) 140/94 97.9 F (36.6 C) Oral 94 18 93 %     Recent laboratory studies: No results for input(s): "WBC", "HGB", "HCT", "PLT", "NA", "K", "CL", "CO2", "BUN", "CREATININE", "GLUCOSE", "INR", "CALCIUM" in the last 72 hours.  Invalid input(s): "PT", "2"   Discharge Medications:   Allergies as of 06/19/2022       Reactions   Amlodipine Besylate Swelling, Other (See Comments)   Leg swelling with 10 mg daily am dosing; decreased with BID 5 mg dosing   Aspirin Itching        Medication List     TAKE these medications    albuterol 108 (90 Base) MCG/ACT  inhaler Commonly known as: VENTOLIN HFA Inhale 2 puffs into the lungs every 4 (four) hours as needed for wheezing or shortness of breath.   apixaban 2.5 MG Tabs tablet Commonly known as: Eliquis Take 1 tablet (2.5 mg total) by mouth 2 (two) times daily. Restart normal 5 mg dose after 2 weeks What changed:  medication strength how much to take additional instructions   atorvastatin 40 MG tablet Commonly known as: LIPITOR Take 1 tablet (40 mg total) by mouth daily.   Cholecalciferol 1.25 MG (50000 UT) Tabs Take 1 tablet by mouth once a week.   colchicine 0.6 MG tablet Take 1 tablet (0.6 mg total) by mouth daily as needed (for gout flare).   docusate sodium 100 MG capsule Commonly known as: COLACE Take 1 capsule (100 mg total) by mouth every 12 (twelve) hours.   febuxostat 40 MG tablet Commonly known as: ULORIC Take 40 mg by mouth daily.   fluticasone 50 MCG/ACT nasal spray Commonly known as: FLONASE Place 1 spray into both nostrils daily. What changed:  when to take this reasons to take this   fluticasone-salmeterol 250-50 MCG/ACT Aepb Commonly known as: ADVAIR Inhale 1 puff into the lungs 2 (two) times daily as needed (shortness of breath).   folic acid 1 MG tablet Commonly known as: FOLVITE Take 1 mg by mouth daily.   furosemide 40 MG tablet Commonly known as: LASIX TAKE ONE TABLET BY MOUTH DAILY FOR THREE DAYS **THEN TAKE AS NEEDED FOR INCREASE WEIGHT GAIN**   gabapentin 600 MG tablet Commonly known as: NEURONTIN Take 600 mg by mouth 3 (three) times daily.   hydrALAZINE 100 MG tablet Commonly known as: APRESOLINE Take 100 mg by mouth 3 (three) times daily.   IRON PO Take 1 tablet by mouth daily.   loratadine 10 MG tablet Commonly known as: CLARITIN Take 10 mg by mouth daily.   Magnesium Oxide 400 MG Caps Take 1 capsule (400 mg total) by mouth every other day. While taking daily multivitamin with 100mg  magnesium every day   montelukast 10 MG  tablet Commonly known as: SINGULAIR Take 1 tablet (10 mg total) by mouth at bedtime.   multivitamin with minerals Tabs tablet Take 1 tablet by mouth daily.   Orencia ClickJect 0000000 MG/ML Soaj Generic drug: Abatacept Inject 125 mg into the skin once a week.   oxyCODONE 5 MG immediate release tablet Commonly known as: Oxy IR/ROXICODONE Take 1 tablet (5 mg total) by mouth 4 (four) times daily as needed for moderate pain. What changed: Another medication with the same name was added. Make sure you understand how and when to take each.   oxyCODONE 5 MG immediate release tablet Commonly known as: Roxicodone Take 1-2 tablets (5-10 mg total) by mouth every 4 (four) hours as needed for up  to 7 days for severe pain. What changed: You were already taking a medication with the same name, and this prescription was added. Make sure you understand how and when to take each.   pantoprazole 40 MG tablet Commonly known as: PROTONIX Take 1 tablet (40 mg total) by mouth 2 (two) times daily.   polyethylene glycol 17 g packet Commonly known as: MiraLax Take 17 g by mouth 2 (two) times daily.   potassium chloride SA 20 MEQ tablet Commonly known as: KLOR-CON M Take 1 tablet by  mouth only when you have to use a Lasix   sildenafil 20 MG tablet Commonly known as: REVATIO Take 20 mg by mouth daily as needed (erectile dysfunction).   sotalol 120 MG tablet Commonly known as: BETAPACE Take 1 tablet (120 mg total) by mouth every 12 (twelve) hours.   spironolactone 25 MG tablet Commonly known as: ALDACTONE TAKE ONE TABLET BY MOUTH DAILY   tiZANidine 2 MG tablet Commonly known as: ZANAFLEX Take 1 tablet (2 mg total) by mouth every 6 (six) hours as needed.   Vitamin D (Ergocalciferol) 1.25 MG (50000 UNIT) Caps capsule Commonly known as: DRISDOL TAKE ONE CAPSULE BY MOUTH ONCE WEEKLY               Durable Medical Equipment  (From admission, onward)           Start     Ordered    06/18/22 1547  DME Walker rolling  Once       Question:  Patient needs a walker to treat with the following condition  Answer:  Status post right knee replacement   06/18/22 1546   06/18/22 1547  DME 3 n 1  Once        06/18/22 1546              Discharge Care Instructions  (From admission, onward)           Start     Ordered   06/19/22 0000  Change dressing       Comments: Change dressing Only if drainage exceeds 40% of window on dressing   06/19/22 0741            Diagnostic Studies: No results found.  Disposition: Discharge disposition: 01-Home or Self Care       Discharge Instructions     Call MD / Call 911   Complete by: As directed    If you experience chest pain or shortness of breath, CALL 911 and be transported to the hospital emergency room.  If you develope a fever above 101 F, pus (white drainage) or increased drainage or redness at the wound, or calf pain, call your surgeon's office.   Change dressing   Complete by: As directed    Change dressing Only if drainage exceeds 40% of window on dressing   Constipation Prevention   Complete by: As directed    Drink plenty of fluids.  Prune juice may be helpful.  You may use a stool softener, such as Colace (over the counter) 100 mg twice a day.  Use MiraLax (over the counter) for constipation as needed.   Diet - low sodium heart healthy   Complete by: As directed    Increase activity slowly as tolerated   Complete by: As directed    Post-operative opioid taper instructions:   Complete by: As directed    POST-OPERATIVE OPIOID TAPER INSTRUCTIONS: It is important to wean off of your opioid medication as soon  as possible. If you do not need pain medication after your surgery it is ok to stop day one. Opioids include: Codeine, Hydrocodone(Norco, Vicodin), Oxycodone(Percocet, oxycontin) and hydromorphone amongst others.  Long term and even short term use of opiods can cause: Increased pain  response Dependence Constipation Depression Respiratory depression And more.  Withdrawal symptoms can include Flu like symptoms Nausea, vomiting And more Techniques to manage these symptoms Hydrate well Eat regular healthy meals Stay active Use relaxation techniques(deep breathing, meditating, yoga) Do Not substitute Alcohol to help with tapering If you have been on opioids for less than two weeks and do not have pain than it is ok to stop all together.  Plan to wean off of opioids This plan should start within one week post op of your joint replacement. Maintain the same interval or time between taking each dose and first decrease the dose.  Cut the total daily intake of opioids by one tablet each day Next start to increase the time between doses. The last dose that should be eliminated is the evening dose.           Follow-up Information     Frederik Pear, MD Follow up in 2 week(s).   Specialty: Orthopedic Surgery Contact information: [place] [address] [phone]                  Signed: Kerin Salen 06/19/2022, 4:47 PM

## 2022-06-19 NOTE — Progress Notes (Signed)
Patient discharged to home w/ family. Given all belongings, instructions. Verbalized understanding of all instructions. Escorted to pov via w/c. 

## 2022-06-20 ENCOUNTER — Emergency Department (HOSPITAL_COMMUNITY)
Admission: EM | Admit: 2022-06-20 | Discharge: 2022-06-20 | Disposition: A | Discharge status: Home / Self Care | Payer: No Typology Code available for payment source | Source: Home / Self Care | Attending: Student | Admitting: Student

## 2022-06-20 ENCOUNTER — Encounter (HOSPITAL_COMMUNITY): Payer: Self-pay

## 2022-06-20 ENCOUNTER — Emergency Department (HOSPITAL_COMMUNITY): Payer: No Typology Code available for payment source

## 2022-06-20 ENCOUNTER — Other Ambulatory Visit: Payer: Self-pay

## 2022-06-20 DIAGNOSIS — N182 Chronic kidney disease, stage 2 (mild): Secondary | ICD-10-CM | POA: Insufficient documentation

## 2022-06-20 DIAGNOSIS — Z79899 Other long term (current) drug therapy: Secondary | ICD-10-CM

## 2022-06-20 DIAGNOSIS — I4891 Unspecified atrial fibrillation: Secondary | ICD-10-CM | POA: Insufficient documentation

## 2022-06-20 DIAGNOSIS — Z7901 Long term (current) use of anticoagulants: Secondary | ICD-10-CM | POA: Insufficient documentation

## 2022-06-20 DIAGNOSIS — I2510 Atherosclerotic heart disease of native coronary artery without angina pectoris: Secondary | ICD-10-CM | POA: Insufficient documentation

## 2022-06-20 DIAGNOSIS — R509 Fever, unspecified: Secondary | ICD-10-CM | POA: Insufficient documentation

## 2022-06-20 DIAGNOSIS — J45909 Unspecified asthma, uncomplicated: Secondary | ICD-10-CM | POA: Insufficient documentation

## 2022-06-20 DIAGNOSIS — I130 Hypertensive heart and chronic kidney disease with heart failure and stage 1 through stage 4 chronic kidney disease, or unspecified chronic kidney disease: Secondary | ICD-10-CM | POA: Insufficient documentation

## 2022-06-20 DIAGNOSIS — I509 Heart failure, unspecified: Secondary | ICD-10-CM | POA: Insufficient documentation

## 2022-06-20 DIAGNOSIS — E871 Hypo-osmolality and hyponatremia: Secondary | ICD-10-CM | POA: Insufficient documentation

## 2022-06-20 DIAGNOSIS — R410 Disorientation, unspecified: Secondary | ICD-10-CM | POA: Insufficient documentation

## 2022-06-20 DIAGNOSIS — R5082 Postprocedural fever: Secondary | ICD-10-CM

## 2022-06-20 LAB — URINALYSIS, ROUTINE W REFLEX MICROSCOPIC
Bacteria, UA: NONE SEEN
Bilirubin Urine: NEGATIVE
Glucose, UA: NEGATIVE mg/dL
Hgb urine dipstick: NEGATIVE
Ketones, ur: NEGATIVE mg/dL
Leukocytes,Ua: NEGATIVE
Nitrite: NEGATIVE
Protein, ur: 30 mg/dL — AB
Specific Gravity, Urine: 1.023 (ref 1.005–1.030)
pH: 5 (ref 5.0–8.0)

## 2022-06-20 LAB — CBC WITH DIFFERENTIAL/PLATELET
Abs Immature Granulocytes: 0.03 10*3/uL (ref 0.00–0.07)
Basophils Absolute: 0 10*3/uL (ref 0.0–0.1)
Basophils Relative: 0 %
Eosinophils Absolute: 0 10*3/uL (ref 0.0–0.5)
Eosinophils Relative: 0 %
HCT: 33.2 % — ABNORMAL LOW (ref 39.0–52.0)
Hemoglobin: 11.1 g/dL — ABNORMAL LOW (ref 13.0–17.0)
Immature Granulocytes: 0 %
Lymphocytes Relative: 12 %
Lymphs Abs: 1 10*3/uL (ref 0.7–4.0)
MCH: 31.4 pg (ref 26.0–34.0)
MCHC: 33.4 g/dL (ref 30.0–36.0)
MCV: 94.1 fL (ref 80.0–100.0)
Monocytes Absolute: 0.8 10*3/uL (ref 0.1–1.0)
Monocytes Relative: 9 %
Neutro Abs: 6.6 10*3/uL (ref 1.7–7.7)
Neutrophils Relative %: 79 %
Platelets: 145 10*3/uL — ABNORMAL LOW (ref 150–400)
RBC: 3.53 MIL/uL — ABNORMAL LOW (ref 4.22–5.81)
RDW: 13.9 % (ref 11.5–15.5)
WBC: 8.4 10*3/uL (ref 4.0–10.5)
nRBC: 0 % (ref 0.0–0.2)

## 2022-06-20 LAB — COMPREHENSIVE METABOLIC PANEL
ALT: 18 U/L (ref 0–44)
AST: 24 U/L (ref 15–41)
Albumin: 3.6 g/dL (ref 3.5–5.0)
Alkaline Phosphatase: 64 U/L (ref 38–126)
Anion gap: 9 (ref 5–15)
BUN: 20 mg/dL (ref 8–23)
CO2: 21 mmol/L — ABNORMAL LOW (ref 22–32)
Calcium: 9 mg/dL (ref 8.9–10.3)
Chloride: 104 mmol/L (ref 98–111)
Creatinine, Ser: 1.2 mg/dL (ref 0.61–1.24)
GFR, Estimated: >60 mL/min (ref >60)
Glucose, Bld: 125 mg/dL — ABNORMAL HIGH (ref 70–99)
Potassium: 3.8 mmol/L (ref 3.5–5.1)
Sodium: 134 mmol/L — ABNORMAL LOW (ref 135–145)
Total Bilirubin: 1.1 mg/dL (ref 0.3–1.2)
Total Protein: 6.2 g/dL — ABNORMAL LOW (ref 6.5–8.1)

## 2022-06-20 LAB — LACTIC ACID, PLASMA: Lactic Acid, Venous: 1.5 mmol/L (ref 0.5–1.9)

## 2022-06-20 LAB — PROTIME-INR
INR: 1.3 — ABNORMAL HIGH (ref 0.8–1.2)
Prothrombin Time: 15.9 seconds — ABNORMAL HIGH (ref 11.4–15.2)

## 2022-06-20 LAB — APTT: aPTT: 34 seconds (ref 24–36)

## 2022-06-20 MED ORDER — ACETAMINOPHEN 500 MG PO TABS
1000.0000 mg | ORAL_TABLET | Freq: Once | ORAL | Status: AC
  Administered 2022-06-20: 1000 mg via ORAL
  Filled 2022-06-20: qty 2

## 2022-06-20 MED ORDER — OXYCODONE HCL 5 MG PO TABS
5.0000 mg | ORAL_TABLET | Freq: Once | ORAL | Status: AC
  Administered 2022-06-20: 5 mg via ORAL
  Filled 2022-06-20: qty 1

## 2022-06-20 NOTE — ED Provider Notes (Signed)
[facility]-EMERGENCY DEPT Provider Note  CSN: [id number] Arrival date & time: 06/20/22 1558  Chief Complaint(s) Altered Mental Status  HPI Ryan Glass is a 66 y.o. male with PMH CHF, ascending aortic aneurysm, CKD, HOCM status post AICD placement, postop day from a right TKA done by Gean BirchwoodFrank Rowan who presents to the emergency department for evaluation of fever and altered mental status.  History obtained from patient and patient's daughter who states that this morning he had an episode of sluggishness and was slow to respond to questions.  He was found to be febrile at home and they spoke with the on-call orthopedist who recommended the patient come to the emergency department for further evaluation.  On arrival, patient's altered mental status has resolved and he endorses right knee pain at the site of his TKA but denies chest pain, shortness of breath, Donnell pain, nausea, vomiting, headache, or other systemic symptoms.  Patient arrives febrile to 100.9 but has no tachycardia.  Of note, patient current pain regimen is accommodation of oxycodone and muscle relaxers.   Past Medical History Past Medical History:  Diagnosis Date   AICD (automatic cardioverter/defibrillator) present    Ascending aortic aneurysm (HCC)    a. CT 05/2021 showed 4.4x4.4cm TAA. Stable on chest CTA 12/2021 at 4.4cm.   Atrial fibrillation (HCC)    Bilateral lower extremity edema    Celiac artery dilatation (HCC)    1.8cm at the celiac trunk on Chest CT 12/2021   CHF (congestive heart failure) (HCC)    Chronic gout without tophus    followed by dr Sharmon Revereziolkowska    (06-08-2020  per pt last episode right knee 3 wks ago)   CKD (chronic kidney disease), stage II    nephrology--- Virgina NorfolkLindsay Penninger PA (10-29-2019 note in epic scanned in  media)   Coronary artery disease    Heart failure with mid-range ejection fraction (HCC) 05/27/2021   05/26/21: Left ventricular ejection fraction, by estimation, is 45 to  50%. There is severe asymmetric left ventricular hypertrophy. G1DD present.    History of COVID-19 06/07/2021   HOCM (hypertrophic obstructive cardiomyopathy) (HCC)    s/p ICD 07/2021   Hypertension    followed by cardiology, dr t. turner   (05-14-2018 nuclear study in epic , normal perfusion with nuclear ef 61%)   Left hydrocele    Low serum potassium level 04/27/2014   OSA on CPAP    per pt uses every night   Palpitations followed by dr t. Mayford Knifeturner   06-08-2020  still feels palipations due to PVCs when exertion but not with chest pain/ discomfort   PVC's (premature ventricular contractions) cardiologist--- dr t. turner   Status post PVC ablation by Dr. Ladona Ridgelaylor 2019 with recurrence of frequent PVCs/bigeminy;  prior pseudobradycardia r/t pvcs   Rheumatoid arthritis involving multiple sites [facility](HCC)    rheumotology--- dr a. Sharmon Revereziolkowska  ([facility] in [place])   Patient Active Problem List   Diagnosis Date Noted   Arthritis of right knee 06/18/2022   Osteoarthritis of right knee 06/15/2022   S/P total knee arthroplasty, left 04/30/2022   Degenerative arthritis of left knee 04/26/2022   Pre-operative respiratory examination 04/05/2022   Dyspnea on exertion 01/23/2022   Elevated troponin 01/23/2022   Gastrointestinal hemorrhage 01/23/2022   PFO (patent foramen ovale) 12/28/2021   Asthma 12/26/2021   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   CAP (community acquired pneumonia) 10/13/2021   Dyspnea    Acute respiratory failure with hypoxia (HCC) 10/07/2021  ABLA (acute blood loss anemia) 10/07/2021   History of pulmonary embolus (PE) 10/07/2021   AF (paroxysmal atrial fibrillation) (HCC) 10/07/2021   Hypertrophic cardiomyopathy (HCC) 08/15/2021   Ventricular tachycardia (HCC) 08/02/2021   Ventricular tachycardia, unspecified (HCC) 08/02/2021   Acute on chronic respiratory failure (HCC) 06/07/2021   History of COVID-19 06/07/2021   Rheumatoid arthritis involving multiple sites on Humira (HCC)  06/07/2021   On chronic prednisone therapy for Rheumatoid Arthritis 06/07/2021   CAD (coronary artery disease), non-obstructive 05/30/2021   Precordial chest pain    Heart failure with mid-range ejection fraction (HCC) 05/27/2021   LVH (left ventricular hypertrophy) due to hypertensive disease, with heart failure (HCC) 05/27/2021   Class 2 severe obesity with serious comorbidity and body mass index (BMI) of 35.0 to 35.9 in adult [facility]) 05/26/2021   Pulmonary embolism (HCC) 05/25/2021   Thoracic aortic aneurysm without rupture (HCC) 02/01/2021   Ascending aortic aneurysm (HCC) 02/01/2021   Other hyperlipidemia 10/14/2020   Testicular swelling 05/14/2019   Pain in right knee 01/29/2019   PVC (premature ventricular contraction) 10/07/2018   Ventricular premature depolarization 10/07/2018   Dilated aortic root (HCC)    Allergic rhinitis 01/10/2016   Chronic gout without tophus 01/10/2016   Drug therapy 01/10/2016   OSA (obstructive sleep apnea) 01/10/2016   Colonic polyp 12/14/2015   Rt groin pain 12/14/2015   Scoliosis of thoracolumbar spine 12/14/2015   Right lower quadrant abdominal pain 04/20/2015   Arthritis of knee 02/22/2015   Edema of extremities 02/22/2015   Essential hypertension 02/22/2015   Preventative health care 02/22/2015   Bradycardia by electrocardiogram 04/16/2014   Home Medication(s) Prior to Admission medications   Medication Sig Start Date End Date Taking? Authorizing Provider  Abatacept (ORENCIA CLICKJECT) 125 MG/ML SOAJ Inject 125 mg into the skin once a week. 11/08/21   [provider]  albuterol (VENTOLIN HFA) 108 (90 Base) MCG/ACT inhaler Inhale 2 puffs into the lungs every 4 (four) hours as needed for wheezing or shortness of breath. 03/02/21   Betancourt, Jarold Song, NP  apixaban (ELIQUIS) 2.5 MG TABS tablet Take 1 tablet (2.5 mg total) by mouth 2 (two) times daily. Restart normal 5 mg dose after 2 weeks 06/18/22   Allena Katz, PA-C  atorvastatin  (LIPITOR) 40 MG tablet Take 1 tablet (40 mg total) by mouth daily. 05/22/22   Betancourt, Jarold Song, NP  Cholecalciferol 1.25 MG (50000 UT) TABS Take 1 tablet by mouth once a week. 03/25/22 06/23/22  Betancourt, Jarold Song, NP  colchicine 0.6 MG tablet Take 1 tablet (0.6 mg total) by mouth daily as needed (for gout flare). 05/04/22   Gwyneth Sprout, MD  docusate sodium (COLACE) 100 MG capsule Take 1 capsule (100 mg total) by mouth every 12 (twelve) hours. 06/18/22   Allena Katz, PA-C  febuxostat (ULORIC) 40 MG tablet Take 40 mg by mouth daily. 05/22/22 08/20/22  [provider]  Ferrous Sulfate (IRON PO) Take 1 tablet by mouth daily.    [provider]  fluticasone (FLONASE) 50 MCG/ACT nasal spray Place 1 spray into both nostrils daily. Patient taking differently: Place 1 spray into both nostrils daily as needed for allergies. 06/08/21   Lilland, Alana, DO  fluticasone-salmeterol (ADVAIR) 250-50 MCG/ACT AEPB Inhale 1 puff into the lungs 2 (two) times daily as needed (shortness of breath). 03/26/21   [provider]  folic acid (FOLVITE) 1 MG tablet Take 1 mg by mouth daily. 05/21/22   [provider]  furosemide (LASIX)  40 MG tablet TAKE ONE TABLET BY MOUTH DAILY FOR THREE DAYS **THEN TAKE AS NEEDED FOR INCREASE WEIGHT GAIN** 04/30/22   Turner, Eber Hong, MD  gabapentin (NEURONTIN) 600 MG tablet Take 600 mg by mouth 3 (three) times daily. 05/14/22   [provider]  hydrALAZINE (APRESOLINE) 100 MG tablet Take 100 mg by mouth 3 (three) times daily. 11/29/21   [provider]  loratadine (CLARITIN) 10 MG tablet Take 10 mg by mouth daily. 08/11/20   [provider]  Magnesium Oxide 400 MG CAPS Take 1 capsule (400 mg total) by mouth every other day. While taking daily multivitamin with 100mg  magnesium every day 12/19/21   Betancourt, Aura Fey, NP  montelukast (SINGULAIR) 10 MG tablet Take 1 tablet (10 mg total) by mouth at bedtime. 09/07/21   Betancourt, Aura Fey,  NP  Multiple Vitamin (MULTIVITAMIN WITH MINERALS) TABS tablet Take 1 tablet by mouth daily.    [provider]  oxyCODONE (OXY IR/ROXICODONE) 5 MG immediate release tablet Take 1 tablet (5 mg total) by mouth 4 (four) times daily as needed for moderate pain. 06/18/22   Leighton Parody, PA-C  oxyCODONE (ROXICODONE) 5 MG immediate release tablet Take 1-2 tablets (5-10 mg total) by mouth every 4 (four) hours as needed for up to 7 days for severe pain. 06/19/22 06/26/22  Frederik Pear, MD  pantoprazole (PROTONIX) 40 MG tablet Take 1 tablet (40 mg total) by mouth 2 (two) times daily. Patient not taking: Reported on 04/25/2022 10/12/21   Ryan Kindred A, DO  polyethylene glycol (MIRALAX) 17 g packet Take 17 g by mouth 2 (two) times daily. 06/18/22   Leighton Parody, PA-C  potassium chloride SA (KLOR-CON M) 20 MEQ tablet Take 1 tablet by  mouth only when you have to use a Lasix 01/26/22   Sueanne Margarita, MD  sildenafil (REVATIO) 20 MG tablet Take 20 mg by mouth daily as needed (erectile dysfunction). 08/03/20   [provider]  sotalol (BETAPACE) 120 MG tablet Take 1 tablet (120 mg total) by mouth every 12 (twelve) hours. 04/19/22   Shirley Friar, PA-C  spironolactone (ALDACTONE) 25 MG tablet TAKE ONE TABLET BY MOUTH DAILY 05/11/22   Shirley Friar, PA-C  tiZANidine (ZANAFLEX) 2 MG tablet Take 1 tablet (2 mg total) by mouth every 6 (six) hours as needed. 06/18/22   Leighton Parody, PA-C  Vitamin D, Ergocalciferol, (DRISDOL) 1.25 MG (50000 UNIT) CAPS capsule TAKE ONE CAPSULE BY MOUTH ONCE WEEKLY 06/14/22   Olen Cordial, NP                                                                                                                                    Past Surgical History Past Surgical History:  Procedure Laterality Date   BIOPSY  10/08/2021   Procedure: BIOPSY;  Surgeon: Daryel November, MD;  Location: [place];  Service: Gastroenterology;;  BUBBLE STUDY   08/18/2021   Procedure: BUBBLE STUDY;  Surgeon: Wendall Stade, MD;  Location: [facility] ENDOSCOPY;  Service: Cardiovascular;;   ESOPHAGOGASTRODUODENOSCOPY N/A 10/08/2021   Procedure: ESOPHAGOGASTRODUODENOSCOPY (EGD);  Surgeon: Jenel Lucks, MD;  Location: [facility] ENDOSCOPY;  Service: Gastroenterology;  Laterality: N/A;   HYDROCELE EXCISION Left 06/14/2020   Procedure: LEFT  HYDROCELECTOMY ADULT;  Surgeon: Noel Christmas, MD;  Location: [facility];  Service: Urology;  Laterality: Left;   ICD IMPLANT N/A 08/07/2021   Procedure: ICD IMPLANT;  Surgeon: Marinus Maw, MD;  Location: [facility] INVASIVE CV LAB;  Service: Cardiovascular;  Laterality: N/A;   INCISIONAL HERNIA REPAIR  02-23-2016   @[facility]    LAPAROSCOPIC   LAPAROSCOPIC INGUINAL HERNIA REPAIR Bilateral 08-22-2015  @[facility]    AND UMBILICAL HERNIA REPAIR   PVC ABLATION N/A 10/07/2018   Procedure: PVC ABLATION;  Surgeon: , MD;  Location: [facility] INVASIVE CV LAB;  Service: Cardiovascular;  Laterality: N/A;   RIGHT/LEFT HEART CATH AND CORONARY ANGIOGRAPHY N/A 05/29/2021   Procedure: RIGHT/LEFT HEART CATH AND CORONARY ANGIOGRAPHY;  Surgeon: Marinus Maw, MD;  Location: [facility] INVASIVE CV LAB;  Service: Cardiovascular;  Laterality: N/A;   TEE WITHOUT CARDIOVERSION N/A 08/18/2021   Procedure: TRANSESOPHAGEAL ECHOCARDIOGRAM (TEE);  Surgeon: Kathleene Hazel, MD;  Location: [facility] ENDOSCOPY;  Service: Cardiovascular;  Laterality: N/A;   TOTAL KNEE ARTHROPLASTY Left 04/30/2022   Procedure: LEFT TOTAL KNEE ARTHROPLASTY;  Surgeon: [facility], MD;  Location: [facility]S;  Service: Orthopedics;  Laterality: Left;   TOTAL KNEE ARTHROPLASTY Right 06/18/2022   Procedure: RIGHT TOTAL KNEE ARTHROPLASTY;  Surgeon: Gean Birchwood, MD;  Location: [facility]S;  Service: Orthopedics;  Laterality: Right;   UMBILICAL HERNIA REPAIR  child   Family History Family History  Problem Relation Age of Onset   Cardiomyopathy Mother    Heart attack Father     Social  History Social History   Tobacco Use   Smoking status: Never   Smokeless tobacco: Never  Vaping Use   Vaping Use: Never used  Substance Use Topics   Alcohol use: Never   Drug use: Never   Allergies Amlodipine besylate and Aspirin  Review of Systems Review of Systems  Constitutional:  Positive for fever.  Psychiatric/Behavioral:  Positive for confusion.     Physical Exam Vital Signs  I have reviewed the triage vital signs BP (!) 154/90   Pulse 89   Temp (!) 100.9 F (38.3 C) (Oral)   Resp 20   Ht 5\' 8"  (1.727 m)   Wt 104.7 kg   SpO2 94%   BMI 35.10 kg/m   Physical Exam Constitutional:      General: He is not in acute distress.    Appearance: Normal appearance.  HENT:     Head: Normocephalic and atraumatic.     Nose: No congestion or rhinorrhea.  Eyes:     General:        Right eye: No discharge.        Left eye: No discharge.     Extraocular Movements: Extraocular movements intact.     Pupils: Pupils are equal, round, and reactive to light.  Cardiovascular:     Rate and Rhythm: Normal rate and regular rhythm.     Heart sounds: No murmur heard. Pulmonary:     Effort: No respiratory distress.     Breath sounds: No wheezing or rales.  Abdominal:     General: There is no distension.     Tenderness:  There is no abdominal tenderness.  Musculoskeletal:        General: Swelling present. Normal range of motion.     Cervical back: Normal range of motion.  Skin:    General: Skin is warm and dry.     Findings: Bruising present.  Neurological:     General: No focal deficit present.     Mental Status: He is alert.     ED Results and Treatments Labs (all labs ordered are listed, but only abnormal results are displayed) Labs Reviewed  COMPREHENSIVE METABOLIC PANEL - Abnormal; Notable for the following components:      Result Value   Sodium 134 (*)    CO2 21 (*)    Glucose, Bld 125 (*)    Total Protein 6.2 (*)    All other components within normal limits   CBC WITH DIFFERENTIAL/PLATELET - Abnormal; Notable for the following components:   RBC 3.53 (*)    Hemoglobin 11.1 (*)    HCT 33.2 (*)    Platelets 145 (*)    All other components within normal limits  PROTIME-INR - Abnormal; Notable for the following components:   Prothrombin Time 15.9 (*)    INR 1.3 (*)    All other components within normal limits  URINALYSIS, ROUTINE W REFLEX MICROSCOPIC - Abnormal; Notable for the following components:   Protein, ur 30 (*)    All other components within normal limits  CULTURE, BLOOD (ROUTINE X 2)  CULTURE, BLOOD (ROUTINE X 2)  URINE CULTURE  LACTIC ACID, PLASMA  APTT  LACTIC ACID, PLASMA                                                                                                                          Radiology DG Knee Complete 4 Views Right  Result Date: 06/20/2022 CLINICAL DATA:  Postoperative fever. Status post right knee arthroplasty 2 days ago. EXAM: RIGHT KNEE - COMPLETE 4+ VIEW COMPARISON:  Right knee radiographs 02/02/2022 FINDINGS: Status post total right knee arthroplasty. Tiny ossific densities just anterior to the proximal tibia likely reflect normal postsurgical change. Expected postoperative changes including moderate joint effusion and mild intra-articular air. No perihardware lucency to indicate hardware failure or loosening. No acute fracture or dislocation. IMPRESSION: 1. Status post total right knee arthroplasty without evidence of hardware failure. 2. Moderate joint effusion and small amount of intra-articular air within normal limits given recent surgery. Cannot exclude a gas forming organism within the joint fluid given indication of postoperative fever. Electronically Signed   By: Yvonne Kendall M.D.   On: 06/20/2022 18:19   DG Chest Port 1 View  Result Date: 06/20/2022 CLINICAL DATA:  Questionable sepsis, evaluate for abnormality in a 66 year old male. EXAM: PORTABLE CHEST 1 VIEW COMPARISON:  February 06, 2022. FINDINGS:  EKG leads project over the chest. LEFT-sided dual lead cardiac pacer defibrillator with power pack over the LEFT chest as before. Cardiomediastinal contours are stable with signs of cardiac enlargement and aortic tortuosity.  Lungs are clear. No sign of pneumothorax. No sign of pleural effusion on frontal radiograph. On limited assessment there is no acute skeletal finding. IMPRESSION: Cardiomegaly without acute process. Electronically Signed   By: Donzetta Kohut M.D.   On: 06/20/2022 18:03    Pertinent labs & imaging results that were available during my care of the patient were reviewed by me and considered in my medical decision making (see MDM for details).  Medications Ordered in ED Medications  acetaminophen (TYLENOL) tablet 1,000 mg (1,000 mg Oral Given 06/20/22 1705)  oxyCODONE (Oxy IR/ROXICODONE) immediate release tablet 5 mg (5 mg Oral Given 06/20/22 1803)                                                                                                                                     Procedures Procedures  (including critical care time)  Medical Decision Making / ED Course   This patient presents to the ED for concern of fever, altered mental status, this involves an extensive number of treatment options, and is a complaint that carries with it a high risk of complications and morbidity.  The differential diagnosis includes normal postoperative finding, wound infection, pneumonia, atelectasis, polypharmacy sepsis  MDM: Patient seen emergency room for evaluation of fever and confusion.  Physical exam with expected bruising around a well-healing right TKA incision but is otherwise unremarkable.  Patient is alert and oriented answering all questions appropriately with no persistent confusion.  There is no numbness, tingling, weakness the extremity or crepitus over the wound.  Laboratory evaluation with no leukocytosis, hemoglobin 11.1 which is expected postop, mild hyponatremia 134, INR  1.3, urinalysis unremarkable, chest x-ray unremarkable, x-ray of the knee shows a joint effusion with air which is to be expected 2 days after a knee operation.  Patient is hemodynamically stable and I have lower suspicion for necrotizing fasciitis at this time.  I spoke with Dr. Janee Morn of [facility] orthopedics who states that with patient's current overall negative work-up, he agrees with my thinking that the patient's confusion is likely secondary to polypharmacy and a fever of 100.9 is not all that unexpected on postop day 2.  At this time, patient does not meet inpatient criteria for admission and has remained hemodynamically stable throughout his ER stay.  He is safe for discharge with outpatient orthopedic follow-up and strict return precautions of which him and his daughter voiced understanding.   Additional history obtained: -Additional history obtained from daughter -External records from outside source obtained and reviewed including: Chart review including previous notes, labs, imaging, consultation notes   Lab Tests: -I ordered, reviewed, and interpreted labs.   The pertinent results include:   Labs Reviewed  COMPREHENSIVE METABOLIC PANEL - Abnormal; Notable for the following components:      Result Value   Sodium 134 (*)    CO2 21 (*)    Glucose, Bld 125 (*)    Total Protein  6.2 (*)    All other components within normal limits  CBC WITH DIFFERENTIAL/PLATELET - Abnormal; Notable for the following components:   RBC 3.53 (*)    Hemoglobin 11.1 (*)    HCT 33.2 (*)    Platelets 145 (*)    All other components within normal limits  PROTIME-INR - Abnormal; Notable for the following components:   Prothrombin Time 15.9 (*)    INR 1.3 (*)    All other components within normal limits  URINALYSIS, ROUTINE W REFLEX MICROSCOPIC - Abnormal; Notable for the following components:   Protein, ur 30 (*)    All other components within normal limits  CULTURE, BLOOD (ROUTINE X 2)  CULTURE,  BLOOD (ROUTINE X 2)  URINE CULTURE  LACTIC ACID, PLASMA  APTT  LACTIC ACID, PLASMA      EKG   EKG Interpretation  Date/Time:  Wednesday June 20 2022 16:20:32 EDT Ventricular Rate:  90 PR Interval:  209 QRS Duration: 169 QT Interval:  390 QTC Calculation: 478 R Axis:   69 Text Interpretation: a-paced rhythm No significant change since last tracing Confirmed by [place] ([id number]) on 06/20/2022 7:30:14 PM         Imaging Studies ordered: I ordered imaging studies including chest x-ray, knee x-ray I independently visualized and interpreted imaging. I agree with the radiologist interpretation   Medicines ordered and prescription drug management: Meds ordered this encounter  Medications   acetaminophen (TYLENOL) tablet 1,000 mg   oxyCODONE (Oxy IR/ROXICODONE) immediate release tablet 5 mg    -I have reviewed the patients home medicines and have made adjustments as needed  Critical interventions none  Consultations Obtained: I requested consultation with the orthopedist Dr. Grandville Silos,  and discussed lab and imaging findings as well as pertinent plan - they recommend: Outpatient follow-up   Cardiac Monitoring: The patient was maintained on a cardiac monitor.  I personally viewed and interpreted the cardiac monitored which showed an underlying rhythm of: A paced rhythm  Social Determinants of Health:  Factors impacting patients care include: none   Reevaluation: After the interventions noted above, I reevaluated the patient and found that they have :improved  Co morbidities that complicate the patient evaluation  Past Medical History:  Diagnosis Date   AICD (automatic cardioverter/defibrillator) present    Ascending aortic aneurysm ([place])    a. CT 05/2021 showed 4.4x4.4cm TAA. Stable on chest CTA 12/2021 at 4.4cm.   Atrial fibrillation (HCC)    Bilateral lower extremity edema    Celiac artery dilatation (HCC)    1.8cm at the celiac trunk on Chest CT 12/2021    CHF (congestive heart failure) (HCC)    Chronic gout without tophus    followed by dr Gerilyn Nestle    (06-08-2020  per pt last episode right knee 3 wks ago)   CKD (chronic kidney disease), stage II    nephrology--- Jen Mow PA (10-29-2019 note in epic scanned in  media)   Coronary artery disease    Heart failure with mid-range ejection fraction ([place]) 05/27/2021   05/26/21: Left ventricular ejection fraction, by estimation, is 45 to 50%. There is severe asymmetric left ventricular hypertrophy. G1DD present.    History of COVID-19 06/07/2021   HOCM (hypertrophic obstructive cardiomyopathy) ([place])    s/p ICD 07/2021   Hypertension    followed by cardiology, dr t. turner   (05-14-2018 nuclear study in epic , normal perfusion with nuclear ef 61%)   Left hydrocele    Low serum potassium level 04/27/2014  OSA on CPAP    per pt uses every night   Palpitations followed by dr t. [name]   06-08-2020  still feels palipations due to PVCs when exertion but not with chest pain/ discomfort   PVC's (premature ventricular contractions) cardiologist--- dr t. turner   Status post PVC ablation by [name] 2019 with recurrence of frequent PVCs/bigeminy;  prior pseudobradycardia r/t pvcs   Rheumatoid arthritis involving multiple sites [facility])    rheumotology--- dr a. Gerilyn Nestle  ([facility] in [place])      Dispostion: I considered admission for this patient, but he currently does not meet inpatient criteria for admission is safe for discharge with outpatient follow-up with return precautions     Final Clinical Impression(s) / ED Diagnoses Final diagnoses:  Fever postop  Polypharmacy     @PCDICTATION @    Teressa Lower, MD 06/20/22 1931

## 2022-06-20 NOTE — ED Triage Notes (Signed)
Pt BIB EMS from home c/o ams. Pt had right knee replacement 2 days ago.

## 2022-06-21 ENCOUNTER — Telehealth (HOSPITAL_BASED_OUTPATIENT_CLINIC_OR_DEPARTMENT_OTHER): Payer: Self-pay

## 2022-06-21 ENCOUNTER — Telehealth (HOSPITAL_COMMUNITY): Payer: Self-pay

## 2022-06-21 ENCOUNTER — Emergency Department (HOSPITAL_COMMUNITY): Payer: No Typology Code available for payment source

## 2022-06-21 ENCOUNTER — Encounter (HOSPITAL_COMMUNITY): Payer: Self-pay | Admitting: Emergency Medicine

## 2022-06-21 ENCOUNTER — Inpatient Hospital Stay (HOSPITAL_COMMUNITY)
Admission: EM | Admit: 2022-06-21 | Discharge: 2022-06-28 | DRG: 469 | Disposition: A | Discharge status: Home Health | Payer: No Typology Code available for payment source | Attending: Family Medicine | Admitting: Family Medicine

## 2022-06-21 DIAGNOSIS — I248 Other forms of acute ischemic heart disease: Secondary | ICD-10-CM | POA: Diagnosis present

## 2022-06-21 DIAGNOSIS — I5022 Chronic systolic (congestive) heart failure: Secondary | ICD-10-CM | POA: Diagnosis present

## 2022-06-21 DIAGNOSIS — R6520 Severe sepsis without septic shock: Secondary | ICD-10-CM | POA: Diagnosis not present

## 2022-06-21 DIAGNOSIS — M1A9XX0 Chronic gout, unspecified, without tophus (tophi): Secondary | ICD-10-CM | POA: Diagnosis present

## 2022-06-21 DIAGNOSIS — Z7952 Long term (current) use of systemic steroids: Secondary | ICD-10-CM

## 2022-06-21 DIAGNOSIS — M170 Bilateral primary osteoarthritis of knee: Secondary | ICD-10-CM | POA: Diagnosis present

## 2022-06-21 DIAGNOSIS — Z7951 Long term (current) use of inhaled steroids: Secondary | ICD-10-CM

## 2022-06-21 DIAGNOSIS — G8929 Other chronic pain: Secondary | ICD-10-CM | POA: Diagnosis present

## 2022-06-21 DIAGNOSIS — R778 Other specified abnormalities of plasma proteins: Secondary | ICD-10-CM

## 2022-06-21 DIAGNOSIS — J309 Allergic rhinitis, unspecified: Secondary | ICD-10-CM | POA: Diagnosis present

## 2022-06-21 DIAGNOSIS — Z7901 Long term (current) use of anticoagulants: Secondary | ICD-10-CM

## 2022-06-21 DIAGNOSIS — E669 Obesity, unspecified: Secondary | ICD-10-CM | POA: Diagnosis present

## 2022-06-21 DIAGNOSIS — I4510 Unspecified right bundle-branch block: Secondary | ICD-10-CM | POA: Diagnosis present

## 2022-06-21 DIAGNOSIS — Z886 Allergy status to analgesic agent status: Secondary | ICD-10-CM

## 2022-06-21 DIAGNOSIS — Z79899 Other long term (current) drug therapy: Secondary | ICD-10-CM

## 2022-06-21 DIAGNOSIS — I10 Essential (primary) hypertension: Secondary | ICD-10-CM | POA: Diagnosis present

## 2022-06-21 DIAGNOSIS — M009 Pyogenic arthritis, unspecified: Principal | ICD-10-CM | POA: Diagnosis present

## 2022-06-21 DIAGNOSIS — R7989 Other specified abnormal findings of blood chemistry: Secondary | ICD-10-CM | POA: Diagnosis present

## 2022-06-21 DIAGNOSIS — I421 Obstructive hypertrophic cardiomyopathy: Secondary | ICD-10-CM | POA: Diagnosis present

## 2022-06-21 DIAGNOSIS — J9621 Acute and chronic respiratory failure with hypoxia: Secondary | ICD-10-CM | POA: Diagnosis present

## 2022-06-21 DIAGNOSIS — E872 Acidosis, unspecified: Secondary | ICD-10-CM | POA: Diagnosis present

## 2022-06-21 DIAGNOSIS — K5909 Other constipation: Secondary | ICD-10-CM | POA: Diagnosis not present

## 2022-06-21 DIAGNOSIS — I422 Other hypertrophic cardiomyopathy: Secondary | ICD-10-CM | POA: Diagnosis not present

## 2022-06-21 DIAGNOSIS — E876 Hypokalemia: Secondary | ICD-10-CM | POA: Diagnosis not present

## 2022-06-21 DIAGNOSIS — Z888 Allergy status to other drugs, medicaments and biological substances status: Secondary | ICD-10-CM

## 2022-06-21 DIAGNOSIS — Z86010 Personal history of colonic polyps: Secondary | ICD-10-CM

## 2022-06-21 DIAGNOSIS — B9689 Other specified bacterial agents as the cause of diseases classified elsewhere: Secondary | ICD-10-CM | POA: Diagnosis present

## 2022-06-21 DIAGNOSIS — R7881 Bacteremia: Secondary | ICD-10-CM | POA: Diagnosis present

## 2022-06-21 DIAGNOSIS — Z6835 Body mass index (BMI) 35.0-35.9, adult: Secondary | ICD-10-CM

## 2022-06-21 DIAGNOSIS — Q2112 Patent foramen ovale: Secondary | ICD-10-CM

## 2022-06-21 DIAGNOSIS — N182 Chronic kidney disease, stage 2 (mild): Secondary | ICD-10-CM | POA: Diagnosis present

## 2022-06-21 DIAGNOSIS — I480 Paroxysmal atrial fibrillation: Secondary | ICD-10-CM | POA: Diagnosis present

## 2022-06-21 DIAGNOSIS — A419 Sepsis, unspecified organism: Secondary | ICD-10-CM | POA: Diagnosis not present

## 2022-06-21 DIAGNOSIS — Z20822 Contact with and (suspected) exposure to covid-19: Secondary | ICD-10-CM | POA: Diagnosis present

## 2022-06-21 DIAGNOSIS — M7989 Other specified soft tissue disorders: Secondary | ICD-10-CM | POA: Diagnosis not present

## 2022-06-21 DIAGNOSIS — I7121 Aneurysm of the ascending aorta, without rupture: Secondary | ICD-10-CM | POA: Diagnosis present

## 2022-06-21 DIAGNOSIS — I2510 Atherosclerotic heart disease of native coronary artery without angina pectoris: Secondary | ICD-10-CM | POA: Diagnosis present

## 2022-06-21 DIAGNOSIS — R7982 Elevated C-reactive protein (CRP): Secondary | ICD-10-CM | POA: Diagnosis not present

## 2022-06-21 DIAGNOSIS — Z8249 Family history of ischemic heart disease and other diseases of the circulatory system: Secondary | ICD-10-CM

## 2022-06-21 DIAGNOSIS — I493 Ventricular premature depolarization: Secondary | ICD-10-CM | POA: Diagnosis present

## 2022-06-21 DIAGNOSIS — M25561 Pain in right knee: Secondary | ICD-10-CM | POA: Diagnosis present

## 2022-06-21 DIAGNOSIS — E7849 Other hyperlipidemia: Secondary | ICD-10-CM | POA: Diagnosis present

## 2022-06-21 DIAGNOSIS — M069 Rheumatoid arthritis, unspecified: Secondary | ICD-10-CM | POA: Diagnosis present

## 2022-06-21 DIAGNOSIS — D62 Acute posthemorrhagic anemia: Secondary | ICD-10-CM | POA: Diagnosis not present

## 2022-06-21 DIAGNOSIS — Z8701 Personal history of pneumonia (recurrent): Secondary | ICD-10-CM

## 2022-06-21 DIAGNOSIS — D631 Anemia in chronic kidney disease: Secondary | ICD-10-CM | POA: Diagnosis present

## 2022-06-21 DIAGNOSIS — Z96651 Presence of right artificial knee joint: Secondary | ICD-10-CM

## 2022-06-21 DIAGNOSIS — M25461 Effusion, right knee: Secondary | ICD-10-CM | POA: Diagnosis not present

## 2022-06-21 DIAGNOSIS — I130 Hypertensive heart and chronic kidney disease with heart failure and stage 1 through stage 4 chronic kidney disease, or unspecified chronic kidney disease: Secondary | ICD-10-CM | POA: Diagnosis present

## 2022-06-21 DIAGNOSIS — J9601 Acute respiratory failure with hypoxia: Secondary | ICD-10-CM

## 2022-06-21 DIAGNOSIS — N529 Male erectile dysfunction, unspecified: Secondary | ICD-10-CM | POA: Diagnosis present

## 2022-06-21 DIAGNOSIS — R52 Pain, unspecified: Secondary | ICD-10-CM | POA: Diagnosis not present

## 2022-06-21 DIAGNOSIS — G4733 Obstructive sleep apnea (adult) (pediatric): Secondary | ICD-10-CM | POA: Diagnosis present

## 2022-06-21 DIAGNOSIS — Z86711 Personal history of pulmonary embolism: Secondary | ICD-10-CM

## 2022-06-21 DIAGNOSIS — Z8616 Personal history of COVID-19: Secondary | ICD-10-CM | POA: Diagnosis not present

## 2022-06-21 DIAGNOSIS — Z96652 Presence of left artificial knee joint: Secondary | ICD-10-CM | POA: Diagnosis present

## 2022-06-21 DIAGNOSIS — R002 Palpitations: Secondary | ICD-10-CM | POA: Diagnosis present

## 2022-06-21 DIAGNOSIS — M419 Scoliosis, unspecified: Secondary | ICD-10-CM | POA: Diagnosis present

## 2022-06-21 DIAGNOSIS — J9620 Acute and chronic respiratory failure, unspecified whether with hypoxia or hypercapnia: Secondary | ICD-10-CM | POA: Diagnosis present

## 2022-06-21 DIAGNOSIS — Z8719 Personal history of other diseases of the digestive system: Secondary | ICD-10-CM

## 2022-06-21 DIAGNOSIS — R5082 Postprocedural fever: Secondary | ICD-10-CM | POA: Diagnosis present

## 2022-06-21 DIAGNOSIS — Z95810 Presence of automatic (implantable) cardiac defibrillator: Secondary | ICD-10-CM | POA: Diagnosis not present

## 2022-06-21 LAB — LACTIC ACID, PLASMA
Lactic Acid, Venous: 2.4 mmol/L (ref 0.5–1.9)
Lactic Acid, Venous: 1.6 mmol/L (ref 0.5–1.9)

## 2022-06-21 LAB — URINALYSIS, ROUTINE W REFLEX MICROSCOPIC
Bacteria, UA: NONE SEEN
Bilirubin Urine: NEGATIVE
Glucose, UA: NEGATIVE mg/dL
Hgb urine dipstick: NEGATIVE
Ketones, ur: 5 mg/dL — AB
Leukocytes,Ua: NEGATIVE
Nitrite: NEGATIVE
Protein, ur: 100 mg/dL — AB
Specific Gravity, Urine: 1.044 — ABNORMAL HIGH (ref 1.005–1.030)
pH: 6 (ref 5.0–8.0)

## 2022-06-21 LAB — COMPREHENSIVE METABOLIC PANEL
ALT: 19 U/L (ref 0–44)
AST: 30 U/L (ref 15–41)
Albumin: 4.1 g/dL (ref 3.5–5.0)
Alkaline Phosphatase: 76 U/L (ref 38–126)
Anion gap: 11 (ref 5–15)
BUN: 16 mg/dL (ref 8–23)
CO2: 20 mmol/L — ABNORMAL LOW (ref 22–32)
Calcium: 9.4 mg/dL (ref 8.9–10.3)
Chloride: 105 mmol/L (ref 98–111)
Creatinine, Ser: 1.13 mg/dL (ref 0.61–1.24)
GFR, Estimated: >60 mL/min (ref >60)
Glucose, Bld: 131 mg/dL — ABNORMAL HIGH (ref 70–99)
Potassium: 4.2 mmol/L (ref 3.5–5.1)
Sodium: 136 mmol/L (ref 135–145)
Total Bilirubin: 1.3 mg/dL — ABNORMAL HIGH (ref 0.3–1.2)
Total Protein: 7.4 g/dL (ref 6.5–8.1)

## 2022-06-21 LAB — URINE CULTURE: Culture: NO GROWTH

## 2022-06-21 LAB — CBC WITH DIFFERENTIAL/PLATELET
Abs Immature Granulocytes: 0.06 10*3/uL (ref 0.00–0.07)
Basophils Absolute: 0 10*3/uL (ref 0.0–0.1)
Basophils Relative: 0 %
Eosinophils Absolute: 0 10*3/uL (ref 0.0–0.5)
Eosinophils Relative: 0 %
HCT: 36.4 % — ABNORMAL LOW (ref 39.0–52.0)
Hemoglobin: 12 g/dL — ABNORMAL LOW (ref 13.0–17.0)
Immature Granulocytes: 1 %
Lymphocytes Relative: 12 %
Lymphs Abs: 1.5 10*3/uL (ref 0.7–4.0)
MCH: 31 pg (ref 26.0–34.0)
MCHC: 33 g/dL (ref 30.0–36.0)
MCV: 94.1 fL (ref 80.0–100.0)
Monocytes Absolute: 0.8 10*3/uL (ref 0.1–1.0)
Monocytes Relative: 6 %
Neutro Abs: 10 10*3/uL — ABNORMAL HIGH (ref 1.7–7.7)
Neutrophils Relative %: 81 %
Platelets: 183 10*3/uL (ref 150–400)
RBC: 3.87 MIL/uL — ABNORMAL LOW (ref 4.22–5.81)
RDW: 13.7 % (ref 11.5–15.5)
WBC: 12.3 10*3/uL — ABNORMAL HIGH (ref 4.0–10.5)
nRBC: 0 % (ref 0.0–0.2)

## 2022-06-21 LAB — TROPONIN I (HIGH SENSITIVITY)
Troponin I (High Sensitivity): 103 ng/L (ref <18)
Troponin I (High Sensitivity): 91 ng/L — ABNORMAL HIGH (ref <18)

## 2022-06-21 LAB — BRAIN NATRIURETIC PEPTIDE: B Natriuretic Peptide: 77.2 pg/mL (ref 0.0–100.0)

## 2022-06-21 LAB — RESP PANEL BY RT-PCR (FLU A&B, COVID) ARPGX2
Influenza A by PCR: NEGATIVE
Influenza B by PCR: NEGATIVE
SARS Coronavirus 2 by RT PCR: NEGATIVE

## 2022-06-21 LAB — PROTIME-INR
INR: 1.1 (ref 0.8–1.2)
Prothrombin Time: 14.4 seconds (ref 11.4–15.2)

## 2022-06-21 LAB — APTT: aPTT: 31 seconds (ref 24–36)

## 2022-06-21 MED ORDER — ONDANSETRON HCL 4 MG/2ML IJ SOLN
4.0000 mg | Freq: Once | INTRAMUSCULAR | Status: AC
  Administered 2022-06-21: 4 mg via INTRAVENOUS
  Filled 2022-06-21: qty 2

## 2022-06-21 MED ORDER — SODIUM CHLORIDE 0.9 % IV SOLN
2.0000 g | Freq: Once | INTRAVENOUS | Status: AC
  Administered 2022-06-21: 2 g via INTRAVENOUS
  Filled 2022-06-21: qty 12.5

## 2022-06-21 MED ORDER — SODIUM CHLORIDE 0.9 % IV SOLN
2.0000 g | Freq: Three times a day (TID) | INTRAVENOUS | Status: DC
  Administered 2022-06-22 – 2022-06-24 (×7): 2 g via INTRAVENOUS
  Filled 2022-06-21 (×7): qty 12.5

## 2022-06-21 MED ORDER — LACTATED RINGERS IV BOLUS (SEPSIS)
1000.0000 mL | Freq: Once | INTRAVENOUS | Status: AC
  Administered 2022-06-21: 1000 mL via INTRAVENOUS

## 2022-06-21 MED ORDER — IOHEXOL 350 MG/ML SOLN
100.0000 mL | Freq: Once | INTRAVENOUS | Status: AC | PRN
  Administered 2022-06-21: 100 mL via INTRAVENOUS

## 2022-06-21 MED ORDER — VANCOMYCIN HCL IN DEXTROSE 1-5 GM/200ML-% IV SOLN: 1000.0000 mg | Freq: Once | INTRAVENOUS | Status: DC

## 2022-06-21 MED ORDER — ACETAMINOPHEN 325 MG PO TABS
650.0000 mg | ORAL_TABLET | Freq: Once | ORAL | Status: AC
  Administered 2022-06-21: 650 mg via ORAL
  Filled 2022-06-21: qty 2

## 2022-06-21 MED ORDER — MORPHINE SULFATE (PF) 4 MG/ML IV SOLN
4.0000 mg | Freq: Once | INTRAVENOUS | Status: AC
  Administered 2022-06-21: 4 mg via INTRAVENOUS
  Filled 2022-06-21: qty 1

## 2022-06-21 MED ORDER — VANCOMYCIN HCL 2000 MG/400ML IV SOLN
2000.0000 mg | Freq: Once | INTRAVENOUS | Status: AC
  Administered 2022-06-21: 2000 mg via INTRAVENOUS
  Filled 2022-06-21: qty 400

## 2022-06-21 MED ORDER — LACTATED RINGERS IV SOLN: INTRAVENOUS | Status: DC

## 2022-06-21 MED ORDER — VANCOMYCIN HCL 1500 MG/300ML IV SOLN
1500.0000 mg | INTRAVENOUS | Status: DC
  Administered 2022-06-22 – 2022-06-23 (×2): 1500 mg via INTRAVENOUS
  Filled 2022-06-21 (×2): qty 300

## 2022-06-21 MED ORDER — METRONIDAZOLE 500 MG/100ML IV SOLN
500.0000 mg | Freq: Once | INTRAVENOUS | Status: AC
  Administered 2022-06-21: 500 mg via INTRAVENOUS
  Filled 2022-06-21: qty 100

## 2022-06-21 NOTE — Telephone Encounter (Signed)
Attempted to reach out to patient regarding positive blood culture, no answer. Left voicemail to return call to discuss.

## 2022-06-21 NOTE — ED Triage Notes (Signed)
Patient states he was called and told to come here due to positive blood cultures. Recent right knee replacement- swelling to right arm. Patient also having shortness of breath and placed on oxygen in triage.

## 2022-06-21 NOTE — Progress Notes (Signed)
Right LE venous duplex study completed. Please see CV Proc for preliminary results.  Naomee Nowland BS, RVT 06/21/2022 7:52 PM

## 2022-06-21 NOTE — Progress Notes (Signed)
Elink following code sepsis °

## 2022-06-21 NOTE — Telephone Encounter (Signed)
Received call from lab with positive blood culture results. Consulted with Dr. Bebe Shaggy who advised pt to return to [facility] ED for further evaluation. Will have day shift charge make contact with pt.

## 2022-06-21 NOTE — Progress Notes (Signed)
Notified bedside nurse of need to draw repeat lactic acid. 

## 2022-06-21 NOTE — ED Provider Notes (Signed)
[facility]-EMERGENCY DEPT Provider Note   CSN: [id number] Arrival date & time: 06/21/22  1509     History  No chief complaint on file.   Ryan Glass is a 66 y.o. male.  Pt is a 66 yo male with a pmhx significant for OSA, CKD, RA, HTN, CHF, afib, HOCM, and CAD.  Pt had his right knee replaced on Monday on 7/10 by Dr. Turner Daniels ([facility]).  Pt was d/c on 7/11.  He came to the ED yesterday for fever and AMS.  He had a septic work up, but appeared better, so he was d/c home.  Pt had + blood cultures today (gram + rods).  He was called and told to come back to the hospital.  Pt has remained febrile and is not back to his normal mental status.  RA O2 sat 87% upon presentation to the ED. He was put on 4L and O2 sat up to 90%.  Pt has some redness and swelling to the right leg.  He is sob.       Home Medications Prior to Admission medications   Medication Sig Start Date End Date Taking? Authorizing Provider  Abatacept (ORENCIA CLICKJECT) 125 MG/ML SOAJ Inject 125 mg into the skin once a week. 11/08/21   [provider]  albuterol (VENTOLIN HFA) 108 (90 Base) MCG/ACT inhaler Inhale 2 puffs into the lungs every 4 (four) hours as needed for wheezing or shortness of breath. 03/02/21   Betancourt, Jarold Song, NP  apixaban (ELIQUIS) 2.5 MG TABS tablet Take 1 tablet (2.5 mg total) by mouth 2 (two) times daily. Restart normal 5 mg dose after 2 weeks 06/18/22   Allena Katz, PA-C  atorvastatin (LIPITOR) 40 MG tablet Take 1 tablet (40 mg total) by mouth daily. 05/22/22   Betancourt, Jarold Song, NP  Cholecalciferol 1.25 MG (50000 UT) TABS Take 1 tablet by mouth once a week. 03/25/22 06/23/22  Betancourt, Jarold Song, NP  colchicine 0.6 MG tablet Take 1 tablet (0.6 mg total) by mouth daily as needed (for gout flare). 05/04/22   Gwyneth Sprout, MD  docusate sodium (COLACE) 100 MG capsule Take 1 capsule (100 mg total) by mouth every 12 (twelve) hours. 06/18/22   Allena Katz, PA-C   febuxostat (ULORIC) 40 MG tablet Take 40 mg by mouth daily. 05/22/22 08/20/22  [provider]  Ferrous Sulfate (IRON PO) Take 1 tablet by mouth daily.    [provider]  fluticasone (FLONASE) 50 MCG/ACT nasal spray Place 1 spray into both nostrils daily. Patient taking differently: Place 1 spray into both nostrils daily as needed for allergies. 06/08/21   Lilland, Alana, DO  fluticasone-salmeterol (ADVAIR) 250-50 MCG/ACT AEPB Inhale 1 puff into the lungs 2 (two) times daily as needed (shortness of breath). 03/26/21   [provider]  folic acid (FOLVITE) 1 MG tablet Take 1 mg by mouth daily. 05/21/22   [provider]  furosemide (LASIX) 40 MG tablet TAKE ONE TABLET BY MOUTH DAILY FOR THREE DAYS **THEN TAKE AS NEEDED FOR INCREASE WEIGHT GAIN** 04/30/22   Turner, Cornelious Bryant, MD  gabapentin (NEURONTIN) 600 MG tablet Take 600 mg by mouth 3 (three) times daily. 05/14/22   [provider]  hydrALAZINE (APRESOLINE) 100 MG tablet Take 100 mg by mouth 3 (three) times daily. 11/29/21   [provider]  loratadine (CLARITIN) 10 MG tablet Take 10 mg by mouth daily. 08/11/20   [provider]  Magnesium Oxide 400 MG CAPS  Take 1 capsule (400 mg total) by mouth every other day. While taking daily multivitamin with  magnesium every day 12/19/21   Betancourt, Jarold Song, NP  montelukast (SINGULAIR) 10 MG tablet Take 1 tablet (10 mg total) by mouth at bedtime. 09/07/21   Betancourt, Jarold Song, NP  Multiple Vitamin (MULTIVITAMIN WITH MINERALS) TABS tablet Take 1 tablet by mouth daily.    [provider]  oxyCODONE (OXY IR/ROXICODONE) 5 MG immediate release tablet Take 1 tablet (5 mg total) by mouth 4 (four) times daily as needed for moderate pain. 06/18/22   Allena Katz, PA-C  oxyCODONE (ROXICODONE) 5 MG immediate release tablet Take 1-2 tablets (5-10 mg total) by mouth every 4 (four) hours as needed for up to 7 days for severe pain. 06/19/22 06/26/22  Gean Birchwood, MD  pantoprazole (PROTONIX) 40 MG tablet Take 1 tablet (40 mg total) by mouth 2 (two) times daily. Patient not taking: Reported on 04/25/2022 10/12/21   Esaw Grandchild A, DO  polyethylene glycol (MIRALAX) 17 g packet Take 17 g by mouth 2 (two) times daily. 06/18/22   Allena Katz, PA-C  potassium chloride SA (KLOR-CON M) 20 MEQ tablet Take 1 tablet by  mouth only when you have to use a Lasix 01/26/22   Quintella Reichert, MD  sildenafil (REVATIO) 20 MG tablet Take 20 mg by mouth daily as needed (erectile dysfunction). 08/03/20   [provider]  sotalol (BETAPACE) 120 MG tablet Take 1 tablet (120 mg total) by mouth every 12 (twelve) hours. 04/19/22   Graciella Freer, PA-C  spironolactone (ALDACTONE) 25 MG tablet TAKE ONE TABLET BY MOUTH DAILY 05/11/22   Graciella Freer, PA-C  tiZANidine (ZANAFLEX) 2 MG tablet Take 1 tablet (2 mg total) by mouth every 6 (six) hours as needed. 06/18/22   Allena Katz, PA-C  Vitamin D, Ergocalciferol, (DRISDOL) 1.25 MG (50000 UNIT) CAPS capsule TAKE ONE CAPSULE BY MOUTH ONCE WEEKLY 06/14/22   Betancourt, Jarold Song, NP      Allergies    Amlodipine besylate and Aspirin    Review of Systems   Review of Systems  Constitutional:  Positive for fever.  Respiratory:  Positive for shortness of breath.   Musculoskeletal:        Right leg pain and swelling  All other systems reviewed and are negative.   Physical Exam Updated Vital Signs BP (!) 156/90   Pulse 90   Temp 99.3 F (37.4 C) (Oral)   Resp 20   SpO2 96%  Physical Exam Vitals and nursing note reviewed.  Constitutional:      Appearance: Normal appearance.  HENT:     Head: Normocephalic and atraumatic.     Right Ear: External ear normal.     Left Ear: External ear normal.     Nose: Nose normal.     Mouth/Throat:     Mouth: Mucous membranes are dry.  Eyes:     Extraocular Movements: Extraocular movements intact.     Conjunctiva/sclera: Conjunctivae normal.     Pupils:  Pupils are equal, round, and reactive to light.  Cardiovascular:     Rate and Rhythm: Regular rhythm. Tachycardia present.     Pulses: Normal pulses.     Heart sounds: Normal heart sounds.  Pulmonary:     Effort: Pulmonary effort is normal. Tachypnea present.     Breath sounds: Normal breath sounds.  Abdominal:     General: Abdomen is flat. Bowel sounds are normal.  Palpations: Abdomen is soft.  Musculoskeletal:     Cervical back: Normal range of motion and neck supple.     Comments: Right leg red and swollen  Skin:    General: Skin is warm.     Capillary Refill: Capillary refill takes less than 2 seconds.  Neurological:     General: No focal deficit present.     Mental Status: He is alert and oriented to person, place, and time.  Psychiatric:        Mood and Affect: Mood normal.        Behavior: Behavior normal.      ED Results / Procedures / Treatments   Labs (all labs ordered are listed, but only abnormal results are displayed) Labs Reviewed  LACTIC ACID, PLASMA - Abnormal; Notable for the following components:      Result Value   Lactic Acid, Venous 2.4 (*)    All other components within normal limits  COMPREHENSIVE METABOLIC PANEL - Abnormal; Notable for the following components:   CO2 20 (*)    Glucose, Bld 131 (*)    Total Bilirubin 1.3 (*)    All other components within normal limits  CBC WITH DIFFERENTIAL/PLATELET - Abnormal; Notable for the following components:   WBC 12.3 (*)    RBC 3.87 (*)    Hemoglobin 12.0 (*)    HCT 36.4 (*)    Neutro Abs 10.0 (*)    All other components within normal limits  URINALYSIS, ROUTINE W REFLEX MICROSCOPIC - Abnormal; Notable for the following components:   Specific Gravity, Urine 1.044 (*)    Ketones, ur 5 (*)    Protein, ur 100 (*)    All other components within normal limits  TROPONIN I (HIGH SENSITIVITY) - Abnormal; Notable for the following components:   Troponin I (High Sensitivity) 103 (*)    All other  components within normal limits  TROPONIN I (HIGH SENSITIVITY) - Abnormal; Notable for the following components:   Troponin I (High Sensitivity) 91 (*)    All other components within normal limits  RESP PANEL BY RT-PCR (FLU A&B, COVID) ARPGX2  CULTURE, BLOOD (ROUTINE X 2)  CULTURE, BLOOD (ROUTINE X 2)  URINE CULTURE  LACTIC ACID, PLASMA  PROTIME-INR  APTT  BRAIN NATRIURETIC PEPTIDE    EKG EKG Interpretation  Date/Time:  Thursday June 21 2022 16:40:23 EDT Ventricular Rate:  103 PR Interval:  195 QRS Duration: 159 QT Interval:  369 QTC Calculation: 483 R Axis:   79 Text Interpretation: Sinus tachycardia Right bundle branch block Since last tracing rate faster Confirmed by Jacalyn Lefevre [id number] on 06/21/2022 4:44:09 PM  Radiology CT Angio Chest PE W and/or Wo Contrast  Result Date: 06/21/2022 CLINICAL DATA:  High probability for pulmonary embolism. Recent knee replacement. Shortness of breath. EXAM: CT ANGIOGRAPHY CHEST WITH CONTRAST TECHNIQUE: Multidetector CT imaging of the chest was performed using the standard protocol during bolus administration of intravenous contrast. Multiplanar CT image reconstructions and MIPs were obtained to evaluate the vascular anatomy. RADIATION DOSE REDUCTION: This exam was performed according to the departmental dose-optimization program which includes automated exposure control, adjustment of the mA and/or kV according to patient size and/or use of iterative reconstruction technique. CONTRAST:  OMNIPAQUE IOHEXOL 350 MG/ML SOLN COMPARISON:  Perfusion lung scan 02/06/2022. CT angiogram chest 12/13/2021. FINDINGS: Cardiovascular: Satisfactory opacification of the pulmonary arteries to the segmental level. No evidence of pulmonary embolism. Heart is enlarged. There are atherosclerotic calcifications of the aorta. Left-sided dual lead pacemaker is  present. No pericardial effusion. Mediastinum/Nodes: No enlarged mediastinal, hilar, or axillary lymph  nodes. Thyroid gland, trachea, and esophagus demonstrate no significant findings. Lungs/Pleura: There is minimal atelectasis in the left lung base. The lungs are otherwise clear. No pleural effusion or pneumothorax identified. Upper Abdomen: No acute abnormality. Musculoskeletal: No acute fractures. Review of the MIP images confirms the above findings. IMPRESSION: 1. No evidence for pulmonary embolism. 2. Cardiomegaly. 3. Minimal left base atelectasis/scarring. Electronically Signed   By: Darliss Cheney M.D.   On: 06/21/2022 18:09   DG Chest Port 1 View  Result Date: 06/21/2022 CLINICAL DATA:  Possible sepsis.  Positive blood cultures. EXAM: PORTABLE CHEST 1 VIEW COMPARISON:  06/20/2022 FINDINGS: Patient has LEFT-sided transvenous pacemaker with leads overlying the RIGHT atrium and RIGHT ventricle. Stable cardiomegaly. Lungs are clear. No edema. IMPRESSION: Stable cardiomegaly.  No evidence for acute pulmonary abnormality. Electronically Signed   By: Norva Pavlov M.D.   On: 06/21/2022 16:42   DG Knee Complete 4 Views Right  Result Date: 06/20/2022 CLINICAL DATA:  Postoperative fever. Status post right knee arthroplasty 2 days ago. EXAM: RIGHT KNEE - COMPLETE 4+ VIEW COMPARISON:  Right knee radiographs 02/02/2022 FINDINGS: Status post total right knee arthroplasty. Tiny ossific densities just anterior to the proximal tibia likely reflect normal postsurgical change. Expected postoperative changes including moderate joint effusion and mild intra-articular air. No perihardware lucency to indicate hardware failure or loosening. No acute fracture or dislocation. IMPRESSION: 1. Status post total right knee arthroplasty without evidence of hardware failure. 2. Moderate joint effusion and small amount of intra-articular air within normal limits given recent surgery. Cannot exclude a gas forming organism within the joint fluid given indication of postoperative fever. Electronically Signed   By: Neita Garnet M.D.    On: 06/20/2022 18:19   DG Chest Port 1 View  Result Date: 06/20/2022 CLINICAL DATA:  Questionable sepsis, evaluate for abnormality in a 66 year old male. EXAM: PORTABLE CHEST 1 VIEW COMPARISON:  February 06, 2022. FINDINGS: EKG leads project over the chest. LEFT-sided dual lead cardiac pacer defibrillator with power pack over the LEFT chest as before. Cardiomediastinal contours are stable with signs of cardiac enlargement and aortic tortuosity. Lungs are clear. No sign of pneumothorax. No sign of pleural effusion on frontal radiograph. On limited assessment there is no acute skeletal finding. IMPRESSION: Cardiomegaly without acute process. Electronically Signed   By: Donzetta Kohut M.D.   On: 06/20/2022 18:03    Procedures Procedures    Medications Ordered in ED Medications  lactated ringers infusion ( Intravenous New Bag/Given 06/21/22 1845)  lactated ringers bolus 1,000 mL (0 mLs Intravenous Stopped 06/21/22 1846)  ceFEPIme (MAXIPIME) 2 g in sodium chloride 0.9 % 100 mL IVPB (0 g Intravenous Stopped 06/21/22 1846)  metroNIDAZOLE (FLAGYL) IVPB 500 mg (0 mg Intravenous Stopped 06/21/22 1845)  vancomycin (VANCOREADY) IVPB 2000 mg/400 mL (0 mg Intravenous Stopped 06/21/22 1926)  acetaminophen (TYLENOL) tablet 650 mg (650 mg Oral Given 06/21/22 1630)  iohexol (OMNIPAQUE) 350 MG/ML injection 100 mL (100 mLs Intravenous Contrast Given 06/21/22 1730)  morphine (PF) 4 MG/ML injection 4 mg (4 mg Intravenous Given 06/21/22 1713)  ondansetron (ZOFRAN) injection 4 mg (4 mg Intravenous Given 06/21/22 1712)    ED Course/ Medical Decision Making/ A&P                           Medical Decision Making Risk OTC drugs. Prescription drug management. Decision regarding hospitalization.   This patient  presents to the ED for concern of sob, this involves an extensive number of treatment options, and is a complaint that carries with it a high risk of complications and morbidity.  The differential diagnosis includes  sepsis, pna, wound infection   Co morbidities that complicate the patient evaluation  OSA, CKD, RA, HTN, CHF, afib, HOCM, and CAD   Additional history obtained:  Additional history obtained from epic chart review External records from outside source obtained and reviewed including wife   Lab Tests:  I Ordered, and personally interpreted labs.  The pertinent results include:  wbc 12.3, hgb 12.0; cmp nl; trop elevated at 104 (2nd trop down to 91); ua nl other than some ketones   Imaging Studies ordered:  I ordered imaging studies including cxr, CT chest, [place] leg  I independently visualized and interpreted imaging which showed  CXR: IMPRESSION:  Stable cardiomegaly.  No evidence for acute pulmonary abnormality.  Ct chest: IMPRESSION:  1. No evidence for pulmonary embolism.  2. Cardiomegaly.  3. Minimal left base atelectasis/scarring.   I agree with the radiologist interpretation   Cardiac Monitoring:  The patient was maintained on a cardiac monitor.  I personally viewed and interpreted the cardiac monitored which showed an underlying rhythm of: sinus tachy initially, now nsr   Medicines ordered and prescription drug management:  I ordered medication including vanc, flagyl, maxipime  for sepsis  Reevaluation of the patient after these medicines showed that the patient improved I have reviewed the patients home medicines and have made adjustments as needed   Test Considered:  ct   Critical Interventions:  abx   Consultations Obtained:  I requested consultation with the orthopedist (Dr. Turner Daniels),  and discussed lab and imaging findings as well as pertinent plan - he will see him tomorrow.  Ok to keep at [employer]. Pt d/w Dr. Cyndia Bent ([facility]) who will admit.   Problem List / ED Course:  Sepsis:  gram + cocci in rods on blood cultures yesterday.  Pt with a persistent fever today .  Code sepsis called today.  Pt given iv abx and ivfs Elevated trop:  CT chest neg for PE.  EKG  with no change.  Trops trending down.  They are likely elevated due to strain.   Reevaluation:  After the interventions noted above, I reevaluated the patient and found that they have :improved   Social Determinants of Health:  Lives at home   Dispostion:  After consideration of the diagnostic results and the patients response to treatment, I feel that the patent would benefit from admission.    CRITICAL CARE Performed by: Jacalyn Lefevre   Total critical care time: 30 minutes  Critical care time was exclusive of separately billable procedures and treating other patients.  Critical care was necessary to treat or prevent imminent or life-threatening deterioration.  Critical care was time spent personally by me on the following activities: development of treatment plan with patient and/or surrogate as well as nursing, discussions with consultants, evaluation of patient's response to treatment, examination of patient, obtaining history from patient or surrogate, ordering and performing treatments and interventions, ordering and review of laboratory studies, ordering and review of radiographic studies, pulse oximetry and re-evaluation of patient's condition.         Final Clinical Impression(s) / ED Diagnoses Final diagnoses:  Sepsis, due to unspecified organism, unspecified whether acute organ dysfunction present (HCC)  Elevated troponin  Acute respiratory failure with hypoxia (HCC)    Rx / DC Orders ED  Discharge Orders     None         Jacalyn Lefevre, MD 06/21/22 1950

## 2022-06-21 NOTE — H&P (Signed)
History and Physical    Patient: Ryan Glass WEX:[id number] DOB: November 22, 1956 DOA: 06/21/2022 DOS: the patient was seen and examined on 06/22/2022 PCP: [name], MD  Patient coming from: Home  Chief Complaint: No chief complaint on file.  HPI: [name] is a 66 y.o. male with medical history significant of HTN, dilated aortic root, PE, Hypertropic cardiomyopathy s/p ICD, CAD, HFrEF,  RA, recent right total knee arthroplasty who returns for positive blood cultures.   He was recently admitted 06/18/2022 for right total hip arthroplasty secondary to osteoarthritis by orthopedic Dr. Turner Daniels and presented with fever and AMS yesterday to ED. AMS resolved in ED. He had negative workup with UA, CXR, knee X-ray and discharged home. Blood cultures were obtained and is now positive for gram positive Rods to aerobic bottle.  Reports pain and increasing redness to right knee around surgical site.  Denies any coughing, shortness of breath or chest pain.  No nausea, vomiting, diarrhea abdominal pain. Reports no bowel movement for past 3 days.   In the ED, he was afebrile, normotensive with hypoxia down to 87% requiring 4L via [place].   Leukocytosis of 12.3, stable hemoglobin of 12.  CMP largely unremarkable with mildly elevated total bilirubin of 1.3.  Lactate of 1.6.  Negative flu and COVID PCR.  UA is negative. CTA chest showed no pulmonary embolism.  Bilateral lower extremity ultrasounds negative for DVT.  Hematoma seen in the right popliteal fossa s/p right total knee replacement.  EKG with RBBB.  He was started on broad-spectrum antibiotic with IV vancomycin, Flagyl and cefepime.  Also given 1 L of IV fluid.  ED physician discussed with orthopedic Dr. Turner Daniels see in consultation tomorrow.  Review of Systems: As mentioned in the history of present illness. All other systems reviewed and are negative. Past Medical History:  Diagnosis Date   AICD (automatic cardioverter/defibrillator) present     Ascending aortic aneurysm (HCC)    a. CT 05/2021 showed 4.4x4.4cm TAA. Stable on chest CTA 12/2021 at 4.4cm.   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 There is severe asymmetric left ventricular hypertrophy. G1DD present.    History of COVID-19 06/07/2021   HOCM (hypertrophic obstructive cardiomyopathy) (HCC)    s/p ICD 07/2021   Hypertension    followed by cardiology, dr t. turner   (05-14-2018 nuclear study in epic , normal perfusion with nuclear ef 61%)   Left hydrocele    Low serum potassium level 04/27/2014   OSA on CPAP    per pt uses every night   Palpitations followed by dr t. Mayford Knife   06-08-2020  still feels palipations due to PVCs when exertion but not with chest pain/ discomfort   PVC's (premature ventricular contractions) cardiologist--- dr t. turner   Status post PVC ablation by Dr. Ladona Ridgel 2019 with recurrence of frequent PVCs/bigeminy;  prior pseudobradycardia  r/t pvcs   Rheumatoid arthritis involving multiple sites [facility])    rheumotology--- dr a. Gerilyn Nestle  ([facility] in [place])   Past Surgical History:  Procedure Laterality Date   BIOPSY  10/08/2021   Procedure: BIOPSY;  Surgeon: Daryel November, MD;  Location: [place];  Service: Gastroenterology;;   Kathleen Argue STUDY  08/18/2021   Procedure: BUBBLE STUDY;  Surgeon: Josue Hector, MD;  Location: [facility] ENDOSCOPY;  Service: Cardiovascular;;   ESOPHAGOGASTRODUODENOSCOPY N/A 10/08/2021    Procedure: ESOPHAGOGASTRODUODENOSCOPY (EGD);  Surgeon: Daryel November, MD;  Location: [place];  Service: Gastroenterology;  Laterality: N/A;   HYDROCELE EXCISION Left 06/14/2020   Procedure: LEFT  HYDROCELECTOMY ADULT;  Surgeon: Robley Fries, MD;  Location: [facility];  Service: Urology;  Laterality: Left;   ICD IMPLANT N/A 08/07/2021   Procedure: ICD IMPLANT;  Surgeon: Evans Lance, MD;  Location: [place] CV LAB;  Service: Cardiovascular;  Laterality: N/A;   INCISIONAL HERNIA REPAIR  02-23-2016   @[facility]    LAPAROSCOPIC   LAPAROSCOPIC INGUINAL HERNIA REPAIR Bilateral 08-22-2015  @[facility]    AND UMBILICAL HERNIA REPAIR   PVC ABLATION N/A 10/07/2018   Procedure: PVC ABLATION;  Surgeon: Evans Lance, MD;  Location: [place] CV LAB;  Service: Cardiovascular;  Laterality: N/A;   RIGHT/LEFT HEART CATH AND CORONARY ANGIOGRAPHY N/A 05/29/2021   Procedure: RIGHT/LEFT HEART CATH AND CORONARY ANGIOGRAPHY;  Surgeon: Burnell Blanks, MD;  Location: [place] CV LAB;  Service: Cardiovascular;  Laterality: N/A;   TEE WITHOUT CARDIOVERSION N/A 08/18/2021   Procedure: TRANSESOPHAGEAL ECHOCARDIOGRAM (TEE);  Surgeon: Josue Hector, MD;  Location[facility] ENDOSCOPY;  Service: Cardiovascular;  Laterality: N/A;   TOTAL KNEE ARTHROPLASTY Left 04/30/2022   Procedure: LEFT TOTAL KNEE ARTHROPLASTY;  Surgeon: Frederik Pear, MD;  Location: [facility];  Service: Orthopedics;  Laterality: Left;   TOTAL KNEE ARTHROPLASTY Right 06/18/2022   Procedure: RIGHT TOTAL KNEE ARTHROPLASTY;  Surgeon: Frederik Pear, MD;  Location: [facility];  Service: Orthopedics;  Laterality: Right;   UMBILICAL HERNIA REPAIR  child   Social History:  reports that he has never smoked. He has never used smokeless tobacco. He reports that he does not drink alcohol and does not use drugs.  Allergies  Allergen Reactions   Amlodipine Besylate Swelling and Other (See Comments)    Leg swelling with 10 mg daily am dosing;  decreased with BID 5 mg dosing   Aspirin Itching    Family History  Problem Relation Age of Onset   Cardiomyopathy Mother    Heart attack Father     Prior to Admission medications   Medication Sig Start Date End Date Taking? Authorizing Provider  Abatacept (ORENCIA CLICKJECT) 0000000 MG/ML SOAJ Inject 125 mg into the skin once a week. 11/08/21   [provider]  acetaminophen-codeine (TYLENOL #3) 300-30 MG tablet Take 1 tablet by mouth 2 (two) times daily as needed. 05/31/22   [provider]  albuterol (VENTOLIN HFA) 108 (90 Base) MCG/ACT inhaler Inhale 2 puffs into the lungs every 4 (four) hours as needed for wheezing or shortness of breath. 03/02/21   Betancourt, Aura Fey, NP  allopurinol (ZYLOPRIM) 100 MG tablet Take 100 mg by mouth daily. 05/31/22   [provider]  apixaban (ELIQUIS) 2.5 MG TABS tablet Take 1 tablet (2.5 mg total) by mouth 2 (two) times daily. Restart normal 5 mg dose after 2 weeks 06/18/22   Leighton Parody, PA-C  atorvastatin (LIPITOR) 40 MG tablet Take 1 tablet (40  mg total) by mouth daily. 05/22/22   Betancourt, Jarold Song, NP  Cholecalciferol 1.25 MG (50000 UT) TABS Take 1 tablet by mouth once a week. 03/25/22 06/23/22  Betancourt, Jarold Song, NP  colchicine 0.6 MG tablet Take 1 tablet (0.6 mg total) by mouth daily as needed (for gout flare). 05/04/22   Gwyneth Sprout, MD  docusate sodium (COLACE) 100 MG capsule Take 1 capsule (100 mg total) by mouth every 12 (twelve) hours. 06/18/22   Allena Katz, PA-C  febuxostat (ULORIC) 40 MG tablet Take 40 mg by mouth daily. 05/22/22 08/20/22  [provider]  Ferrous Sulfate (IRON PO) Take 1 tablet by mouth daily.    [provider]  fluticasone (FLONASE) 50 MCG/ACT nasal spray Place 1 spray into both nostrils daily. Patient taking differently: Place 1 spray into both nostrils daily as needed for allergies. 06/08/21   Lilland, Alana, DO  fluticasone-salmeterol (ADVAIR) 250-50 MCG/ACT AEPB Inhale 1  puff into the lungs 2 (two) times daily as needed (shortness of breath). 03/26/21   [provider]  folic acid (FOLVITE) 1 MG tablet Take 1 mg by mouth daily. 05/21/22   [provider]  furosemide (LASIX) 40 MG tablet TAKE ONE TABLET BY MOUTH DAILY FOR THREE DAYS **THEN TAKE AS NEEDED FOR INCREASE WEIGHT GAIN** Patient taking differently: Take 40 mg by mouth daily. 04/30/22   Quintella Reichert, MD  gabapentin (NEURONTIN) 600 MG tablet Take 600 mg by mouth 3 (three) times daily. 05/14/22   [provider]  hydrALAZINE (APRESOLINE) 100 MG tablet Take 100 mg by mouth 3 (three) times daily. 11/29/21   [provider]  loratadine (CLARITIN) 10 MG tablet Take 10 mg by mouth daily. 08/11/20   [provider]  Magnesium Oxide 400 MG CAPS Take 1 capsule (400 mg total) by mouth every other day. While taking daily multivitamin with 100mg  magnesium every day 12/19/21   02/16/22 A, NP  montelukast (SINGULAIR) 10 MG tablet Take 1 tablet (10 mg total) by mouth at bedtime. 09/07/21   Betancourt, 09/09/21, NP  Multiple Vitamin (MULTIVITAMIN WITH MINERALS) TABS tablet Take 1 tablet by mouth daily.    [provider]  oxyCODONE (OXY IR/ROXICODONE) 5 MG immediate release tablet Take 1 tablet (5 mg total) by mouth 4 (four) times daily as needed for moderate pain. 06/18/22   08/19/22, PA-C  oxyCODONE (ROXICODONE) 5 MG immediate release tablet Take 1-2 tablets (5-10 mg total) by mouth every 4 (four) hours as needed for up to 7 days for severe pain. 06/19/22 06/26/22  06/28/22, MD  pantoprazole (PROTONIX) 40 MG tablet Take 1 tablet (40 mg total) by mouth 2 (two) times daily. Patient not taking: Reported on 04/25/2022 10/12/21   13/3/22 A, DO  polyethylene glycol (MIRALAX) 17 g packet Take 17 g by mouth 2 (two) times daily. 06/18/22   08/19/22, PA-C  potassium chloride SA (KLOR-CON M) 20 MEQ tablet Take 1 tablet by  mouth only when you have to use a  Lasix 01/26/22   Turner, 01/28/22, MD  predniSONE (DELTASONE) 5 MG tablet Take by mouth 3 (three) times daily. 05/31/22   [provider]  sildenafil (REVATIO) 20 MG tablet Take 20 mg by mouth daily as needed (erectile dysfunction). 08/03/20   [provider]  sotalol (BETAPACE) 120 MG tablet Take 1 tablet (120 mg total) by mouth every 12 (twelve) hours. 04/19/22   06/19/22, PA-C  spironolactone (ALDACTONE) 25  MG tablet TAKE ONE TABLET BY MOUTH DAILY Patient taking differently: Take 25 mg by mouth daily. 05/11/22   Shirley Friar, PA-C  tiZANidine (ZANAFLEX) 2 MG tablet Take 1 tablet (2 mg total) by mouth every 6 (six) hours as needed. 06/18/22   Leighton Parody, PA-C  Vitamin D, Ergocalciferol, (DRISDOL) 1.25 MG (50000 UNIT) CAPS capsule TAKE ONE CAPSULE BY MOUTH ONCE WEEKLY Patient taking differently: Take 50,000 Units by mouth every 7 (seven) days. 06/14/22   Olen Cordial, NP    Physical Exam: Vitals:   06/21/22 2145 06/21/22 2200 06/21/22 2215 06/22/22 0115  BP: (!) 133/91 (!) 136/93 (!) 149/99 (!) 142/84  Pulse: 92 89 89 90  Resp: (!) 21 18 17  (!) 21  Temp:      TempSrc:      SpO2: 97% 97% 99% 95%   Constitutional: NAD, calm, comfortable, nontoxic appearing elderly male laying flat in bed and soft-spoken Eyes: , lids and conjunctivae normal ENMT: Mucous membranes are moist. Neck: normal, supple,  Respiratory: clear to auscultation bilaterally, no wheezing, no crackles. Normal respiratory effort on 2L. No accessory muscle use.  Cardiovascular: Regular rate and rhythm, no murmurs / rubs / gallops.  +3 pitting edema of right lower extremity abdomen: no tenderness, no masses palpated. No hepatosplenomegaly. Bowel sounds positive.  Musculoskeletal: no clubbing / cyanosis.  Right lower extremity with clean bandage on anterior thigh over recent surgical incision.  Spreading erythema seen around incision site. Skin: Spreading erythema around incision  site on right lower extremity Neurologic: CN 2-12 grossly intact. Sensation intact Strength 5/5 in all 4.  Psychiatric: Normal judgment and insight. Alert and oriented x 3. Normal mood. Data Reviewed:  See HPI  Assessment and Plan: * Bacteremia -Sepsis secondary to bacteremia with gram positive rods in aerobic culture following recent right TKA on 06/18/22.  -continue IV vancomycin and cefepime.  -repeat blood cultures pending -ortho Dr. Mayer Camel consulted and will see tomorrow. Keeping NPO past midnight.   AF (paroxysmal atrial fibrillation) (HCC) Remains off Eliquis post-operatively. Continue to hold pending further orthopedic recommendations tomorrow.  Hypertrophic cardiomyopathy ([place]) S/p ICD. No new murmur heard on exam.  -obtain TTE to further assess in the setting of bacteremia  Acute on chronic respiratory failure ([place]) Suspect secondary to bacteremia.  Chest x-ray is negative. -Continue IV antibiotics as above  Elevated troponin Downward trending 103 to 91. Suspect demand ischemia.  Essential hypertension Resume home antihypertensives pending med rec      Advance Care Planning:   Code Status: Full Code Full  Consults: orthopedic   Family Communication: no family at bedside  Severity of Illness: The appropriate patient status for this patient is INPATIENT. Inpatient status is judged to be reasonable and necessary in order to provide the required intensity of service to ensure the patient's safety. The patient's presenting symptoms, physical exam findings, and initial radiographic and laboratory data in the context of their chronic comorbidities is felt to place them at high risk for further clinical deterioration. Furthermore, it is not anticipated that the patient will be medically stable for discharge from the hospital within 2 midnights of admission.   * I certify that at the point of admission it is my clinical judgment that the patient will require inpatient  hospital care spanning beyond 2 midnights from the point of admission due to high intensity of service, high risk for further deterioration and high frequency of surveillance required.*  Author: Orene Desanctis, DO 06/22/2022 1:37 AM  For on call review [email or web address].

## 2022-06-21 NOTE — ED Notes (Signed)
Tech in to do venous doppler at bedside. JRPRN

## 2022-06-21 NOTE — Progress Notes (Signed)
A consult was received from an ED physician for cefepime and vancomycin per pharmacy dosing.  The patient's profile has been reviewed for ht/wt/allergies/indication/available labs.    A one time order has been placed for vancomycin 2 g IV.  Agree with order placed by provider for cefepime 2 g IV x 1.    Further antibiotics/pharmacy consults should be ordered by admitting physician if indicated.                       Thank you, Lynden Ang, PharmD, BCPS 06/21/2022  3:47 PM

## 2022-06-21 NOTE — Progress Notes (Signed)
Pharmacy Antibiotic Note  Ryan Glass is a 66 y.o. male admitted on 06/21/2022 with positive blood cultures. Pt had knee replaced on 7/10.  Had septic work up on 7/12 for fever and AMS, told to return to hospital today. Pharmacy has been consulted for vancomycin and cefepime dosing.  1st doses given in the ED  Plan: Vancomycin 1500mg  IV q24h (AUC 511.3, Scr 1.13) Cefepime 2gm IV q8h Follow renal function, cultures and clinical course    Temp (24hrs), Avg:99.3 F (37.4 C), Min:99.3 F (37.4 C), Max:99.3 F (37.4 C)  Recent Labs  Lab 06/20/22 1656 06/21/22 1542 06/21/22 1748  WBC 8.4 12.3*  --   CREATININE 1.20 1.13  --   LATICACIDVEN 1.5 2.4* 1.6    Estimated Creatinine Clearance: 75.4 mL/min (by C-G formula based on SCr of 1.13 mg/dL).    Allergies  Allergen Reactions   Amlodipine Besylate Swelling and Other (See Comments)    Leg swelling with 10 mg daily am dosing; decreased with BID 5 mg dosing   Aspirin Itching    Antimicrobials this admission: 7/13 vanc >> 7/13 cefepime >> 7/13 flagyl x 1  Dose adjustments this admission:  Microbiology results: 7/13 BCx:  7/13 UCx:    Thank you for allowing pharmacy to be a part of this patient's care.  8/13 RPh 06/21/2022, 11:39 PM

## 2022-06-22 ENCOUNTER — Encounter: Payer: Self-pay | Admitting: Registered Nurse

## 2022-06-22 DIAGNOSIS — R7881 Bacteremia: Secondary | ICD-10-CM | POA: Diagnosis not present

## 2022-06-22 DIAGNOSIS — A419 Sepsis, unspecified organism: Secondary | ICD-10-CM

## 2022-06-22 DIAGNOSIS — J9621 Acute and chronic respiratory failure with hypoxia: Secondary | ICD-10-CM | POA: Diagnosis not present

## 2022-06-22 DIAGNOSIS — I480 Paroxysmal atrial fibrillation: Secondary | ICD-10-CM | POA: Diagnosis not present

## 2022-06-22 LAB — CBC WITH DIFFERENTIAL/PLATELET
Abs Immature Granulocytes: 0.03 10*3/uL (ref 0.00–0.07)
Basophils Absolute: 0 10*3/uL (ref 0.0–0.1)
Basophils Relative: 0 %
Eosinophils Absolute: 0 10*3/uL (ref 0.0–0.5)
Eosinophils Relative: 0 %
HCT: 31.1 % — ABNORMAL LOW (ref 39.0–52.0)
Hemoglobin: 10.4 g/dL — ABNORMAL LOW (ref 13.0–17.0)
Immature Granulocytes: 1 %
Lymphocytes Relative: 11 %
Lymphs Abs: 0.7 10*3/uL (ref 0.7–4.0)
MCH: 31.5 pg (ref 26.0–34.0)
MCHC: 33.4 g/dL (ref 30.0–36.0)
MCV: 94.2 fL (ref 80.0–100.0)
Monocytes Absolute: 0.5 10*3/uL (ref 0.1–1.0)
Monocytes Relative: 7 %
Neutro Abs: 5.1 10*3/uL (ref 1.7–7.7)
Neutrophils Relative %: 81 %
Platelets: 138 10*3/uL — ABNORMAL LOW (ref 150–400)
RBC: 3.3 MIL/uL — ABNORMAL LOW (ref 4.22–5.81)
RDW: 13.6 % (ref 11.5–15.5)
WBC: 6.3 10*3/uL (ref 4.0–10.5)
nRBC: 0 % (ref 0.0–0.2)

## 2022-06-22 LAB — BASIC METABOLIC PANEL
Anion gap: 8 (ref 5–15)
BUN: 16 mg/dL (ref 8–23)
CO2: 23 mmol/L (ref 22–32)
Calcium: 8.8 mg/dL — ABNORMAL LOW (ref 8.9–10.3)
Chloride: 108 mmol/L (ref 98–111)
Creatinine, Ser: 0.95 mg/dL (ref 0.61–1.24)
GFR, Estimated: >60 mL/min (ref >60)
Glucose, Bld: 105 mg/dL — ABNORMAL HIGH (ref 70–99)
Potassium: 3.6 mmol/L (ref 3.5–5.1)
Sodium: 139 mmol/L (ref 135–145)

## 2022-06-22 LAB — CULTURE, BLOOD (ROUTINE X 2): Special Requests: ADEQUATE

## 2022-06-22 LAB — URINE CULTURE: Culture: NO GROWTH

## 2022-06-22 LAB — MAGNESIUM: Magnesium: 2 mg/dL (ref 1.7–2.4)

## 2022-06-22 MED ORDER — POLYETHYLENE GLYCOL 3350 17 G PO PACK
17.0000 g | PACK | Freq: Every day | ORAL | Status: DC
  Administered 2022-06-22 – 2022-06-24 (×3): 17 g via ORAL
  Filled 2022-06-22 (×3): qty 1

## 2022-06-22 MED ORDER — OXYCODONE HCL 5 MG PO TABS
10.0000 mg | ORAL_TABLET | Freq: Once | ORAL | Status: AC
  Administered 2022-06-22: 10 mg via ORAL
  Filled 2022-06-22: qty 2

## 2022-06-22 MED ORDER — SPIRONOLACTONE 25 MG PO TABS
25.0000 mg | ORAL_TABLET | Freq: Every day | ORAL | Status: DC
  Administered 2022-06-22 – 2022-06-28 (×7): 25 mg via ORAL
  Filled 2022-06-22 (×7): qty 1

## 2022-06-22 MED ORDER — FUROSEMIDE 10 MG/ML IJ SOLN
60.0000 mg | Freq: Once | INTRAMUSCULAR | Status: AC
  Administered 2022-06-22: 60 mg via INTRAVENOUS
  Filled 2022-06-22: qty 6

## 2022-06-22 MED ORDER — OXYCODONE HCL 5 MG PO TABS
5.0000 mg | ORAL_TABLET | Freq: Four times a day (QID) | ORAL | Status: DC | PRN
  Administered 2022-06-22: 5 mg via ORAL
  Filled 2022-06-22: qty 1

## 2022-06-22 MED ORDER — OXYCODONE HCL 5 MG PO TABS
5.0000 mg | ORAL_TABLET | Freq: Four times a day (QID) | ORAL | Status: DC | PRN
  Administered 2022-06-22 – 2022-06-28 (×17): 10 mg via ORAL
  Filled 2022-06-22 (×19): qty 2

## 2022-06-22 MED ORDER — METHOCARBAMOL 500 MG PO TABS
500.0000 mg | ORAL_TABLET | Freq: Four times a day (QID) | ORAL | Status: DC | PRN
Start: 2022-06-22 — End: 2022-06-28
  Administered 2022-06-22: 500 mg via ORAL
  Filled 2022-06-22: qty 1

## 2022-06-22 MED ORDER — SENNOSIDES-DOCUSATE SODIUM 8.6-50 MG PO TABS
1.0000 | ORAL_TABLET | Freq: Every day | ORAL | Status: DC
Start: 2022-06-22 — End: 2022-06-24
  Administered 2022-06-22 – 2022-06-23 (×3): 1 via ORAL
  Filled 2022-06-22 (×3): qty 1

## 2022-06-22 MED ORDER — HYDRALAZINE HCL 50 MG PO TABS
100.0000 mg | ORAL_TABLET | Freq: Three times a day (TID) | ORAL | Status: DC
  Administered 2022-06-22 – 2022-06-28 (×18): 100 mg via ORAL
  Filled 2022-06-22 (×19): qty 2

## 2022-06-22 MED ORDER — MONTELUKAST SODIUM 10 MG PO TABS
10.0000 mg | ORAL_TABLET | Freq: Every day | ORAL | Status: DC
  Administered 2022-06-22 – 2022-06-27 (×6): 10 mg via ORAL
  Filled 2022-06-22 (×6): qty 1

## 2022-06-22 MED ORDER — MORPHINE SULFATE (PF) 2 MG/ML IV SOLN
2.0000 mg | INTRAVENOUS | Status: DC | PRN
  Administered 2022-06-23 – 2022-06-24 (×4): 2 mg via INTRAVENOUS
  Filled 2022-06-22 (×4): qty 1

## 2022-06-22 NOTE — Telephone Encounter (Signed)
Patient reported he has been admitted to hospital again for surgical site infection.  Had fever Wednesday 12 Jul and instructed to go to ER by surgeon's office.  Stated ambulance transport.  Patient stated it felt like a gout attack again like when he had surgery on other knee.  Some chills today and getting antibiotics.  Dislikes the hospital food.  Waiting for surgeon to see him this afternoon.  Joy (spouse) at home and if he does not answer his telephone on follow up calls he requested that I contact her at [phone].  Patient A&Ox3 spoke full sentences without difficulty no audible cough/wheezing/shortness of breath.  Duration of call 7 minutes. Stated I would check in on him again this weekend via telephone.  Patient verbalized understanding information and had no further questions at this time.   Contacted Joy to verify number correct and asked if any further questions or concerns and that [facility] had instructed me to contact her if he does not answer his phone when I make follow up calls.  She stated that she understood and agreed with his instructions and had no further questions at this time.  She stated that his symptoms did not look like his last gout attack and she has pictures she can send me.  Patient asked her to bring him his colchicine and she reminded him he cannot take home medications unless approved by his inpatient doctors.  Discussed with spouse that patient complaining about his hospital food again this stay.  Discussed [email or web address] or my chart available.  She had no further questions at this time.  Stated he was in ER until mid day today and she was concerned as he was confused on Wednesday but doing much better today.

## 2022-06-22 NOTE — Consult Note (Signed)
Reason for Consult: Bacteremia Referring Physician: Hanley Ben Kshitiz  Ryan Glass is an 66 y.o. male.  HPI: S/p cemented right total knee arthroplasty 06/18/2022.  Patient was discharged home on the 12th and began running a fever reportedly up to 102 degrees.  Was seen in the emergency department on the 13th and one of the 2 blood culture bottles grew out gram-positive rods the next day.  Patient's fever recurred came to the ED yesterday and was admitted with a preliminary diagnosis of sepsis.  Maximum temperature in the ED was 100.9.  Initial lactic acid was elevated at 2.4 and the patient did have a positive troponin 1.  Sepsis protocol was initiated and he is presently on antibiotics.  Patient also had a low O2 sat when he first came in that is resolved.  CT scan of the chest showed no evidence of PE although he has had them in the past.  Ultrasound did not show DVT.  Patient's white blood cell count which was 8 on the 12th elevated to 12.1 yesterday.  He also has some warmth and erythema on his right calf and less so his right knee.  When he had his left knee replaced a few months ago he did have a flare of gout and his leg had a similar appearance.  Patient reports pain in his right total knee and right leg and has trouble bearing weight on it at this time.  Past Medical History:  Diagnosis Date   AICD (automatic cardioverter/defibrillator) present    Ascending aortic aneurysm (HCC)    a. CT 05/2021 showed 4.4x4.4cm TAA. Stable on chest CTA 12/2021 at 4.4cm.   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 There is severe asymmetric left ventricular hypertrophy. G1DD present.    History of COVID-19 06/07/2021   HOCM (hypertrophic obstructive cardiomyopathy) (HCC)    s/p ICD 07/2021   Hypertension    followed by cardiology, dr t. turner   (05-14-2018 nuclear study in epic , normal perfusion with nuclear ef 61%)   Left hydrocele    Low serum potassium level 04/27/2014   OSA on CPAP    per pt uses every night   Palpitations followed by dr t. Mayford Knife   06-08-2020  still feels palipations due to PVCs when exertion but not with chest pain/ discomfort   PVC's (premature ventricular contractions) cardiologist--- dr t. turner   Status post PVC ablation by Dr. Ladona Ridgel 2019 with recurrence of frequent PVCs/bigeminy;  prior pseudobradycardia r/t pvcs   Rheumatoid arthritis involving multiple sites [facility])    rheumotology--- dr a. Sharmon Revere  ([facility] in [place])    Past Surgical History:  Procedure Laterality Date   BIOPSY  10/08/2021   Procedure: BIOPSY;  Surgeon: Jenel Lucks, MD;  Location: [facility] ENDOSCOPY;  Service: Gastroenterology;;   BUBBLE STUDY  08/18/2021   Procedure: BUBBLE STUDY;  Surgeon: Josue Hector, MD;  Location: [facility] ENDOSCOPY;  Service: Cardiovascular;;   ESOPHAGOGASTRODUODENOSCOPY N/A 10/08/2021   Procedure: ESOPHAGOGASTRODUODENOSCOPY (EGD);  Surgeon: Daryel November, MD;  Location: [place];  Service: Gastroenterology;  Laterality: N/A;   HYDROCELE EXCISION Left 06/14/2020   Procedure: LEFT  HYDROCELECTOMY ADULT;  Surgeon: Robley Fries, MD;  Location: [facility];  Service: Urology;  Laterality: Left;   ICD IMPLANT N/A 08/07/2021   Procedure: ICD IMPLANT;  Surgeon: Evans Lance, MD;  Location: [place] CV LAB;  Service: Cardiovascular;  Laterality: N/A;   INCISIONAL HERNIA REPAIR  02-23-2016   @[facility]     LAPAROSCOPIC   LAPAROSCOPIC INGUINAL HERNIA REPAIR Bilateral 08-22-2015  @[facility]    AND UMBILICAL HERNIA REPAIR   PVC ABLATION N/A 10/07/2018   Procedure: PVC ABLATION;  Surgeon: Evans Lance, MD;  Location: [place] CV LAB;  Service: Cardiovascular;  Laterality: N/A;   RIGHT/LEFT HEART CATH AND CORONARY ANGIOGRAPHY N/A 05/29/2021   Procedure: RIGHT/LEFT HEART CATH AND CORONARY ANGIOGRAPHY;  Surgeon: Burnell Blanks, MD;  Location: [place] CV LAB;  Service: Cardiovascular;  Laterality: N/A;   TEE WITHOUT CARDIOVERSION N/A 08/18/2021   Procedure: TRANSESOPHAGEAL ECHOCARDIOGRAM (TEE);  Surgeon: Josue Hector, MD;  Location: [facility] ENDOSCOPY;  Service: Cardiovascular;  Laterality: N/A;   TOTAL KNEE ARTHROPLASTY Left 04/30/2022   Procedure: LEFT TOTAL KNEE ARTHROPLASTY;  Surgeon: Frederik Pear, MD;  Location: [facility];  Service: Orthopedics;  Laterality: Left;   TOTAL KNEE ARTHROPLASTY Right 06/18/2022   Procedure: RIGHT TOTAL KNEE ARTHROPLASTY;  Surgeon: Frederik Pear, MD;  Location: [facility];  Service: Orthopedics;  Laterality: Right;   UMBILICAL HERNIA REPAIR  child    Family History  Problem Relation Age of Onset   Cardiomyopathy Mother    Heart attack Father     Social History:  reports that he has never smoked. He has never used smokeless tobacco. He reports that he does not drink alcohol and does not use drugs.  Allergies:  Allergies  Allergen Reactions   Amlodipine Besylate Swelling and Other (See Comments)    Leg swelling with 10 mg daily am dosing; decreased with BID 5 mg dosing   Aspirin Itching    Medications: I have reviewed the patient's current medications.  Results for orders placed or performed during the hospital encounter of 06/21/22 (from the past 48 hour(s))  Lactic acid, plasma     Status: Abnormal   Collection Time: 06/21/22  3:42 PM  Result Value Ref Range   Lactic Acid, Venous 2.4 (HH) 0.5 - 1.9 mmol/L    Comment: CRITICAL RESULT CALLED TO, READ BACK BY  AND VERIFIED WITH RN Vallery Sa AT 1709 06/21/22 CRUICKSHANK A Performed at [facility], [place] [address] [place], [place] [address]   Comprehensive metabolic panel     Status: Abnormal   Collection Time: 06/21/22  3:42 PM  Result Value Ref Range   Sodium 136 135 - 145 mmol/L   Potassium 4.2 3.5 - 5.1 mmol/L   Chloride 105 98 - 111 mmol/L   CO2 20 (L) 22 - 32 mmol/L   Glucose, Bld 131 (H) 70 - 99 mg/dL    Comment: Glucose reference range applies only to samples taken after fasting for at least 8 hours.   BUN 16 8 - 23 mg/dL   Creatinine, Ser 1.13 0.61 - 1.24 mg/dL   Calcium 9.4 8.9 - 10.3 mg/dL   Total Protein 7.4 6.5 - 8.1 g/dL   Albumin  4.1 3.5 - 5.0 g/dL   AST 30 15 - 41 U/L   ALT 19 0 - 44 U/L   Alkaline Phosphatase 76 38 - 126 U/L   Total Bilirubin 1.3 (H) 0.3 - 1.2 mg/dL   GFR, Estimated >60 >60 mL/min    Comment: (NOTE) Calculated using the CKD-EPI Creatinine Equation (2021)    Anion gap 11 5 - 15    Comment: Performed at [facility], [place] [address], [place], [place] [address]  CBC with Differential     Status: Abnormal   Collection Time: 06/21/22  3:42 PM  Result Value Ref Range   WBC 12.3 (H) 4.0 - 10.5 K/uL   RBC 3.87 (L) 4.22 - 5.81 MIL/uL   Hemoglobin 12.0 (L) 13.0 - 17.0 g/dL   HCT 36.4 (L) 39.0 - 52.0 %   MCV 94.1 80.0 - 100.0 fL   MCH 31.0 26.0 - 34.0 pg   MCHC 33.0 30.0 - 36.0 g/dL   RDW 13.7 11.5 - 15.5 %   Platelets 183 150 - 400 K/uL   nRBC 0.0 0.0 - 0.2 %   Neutrophils Relative % 81 %   Neutro Abs 10.0 (H) 1.7 - 7.7 K/uL   Lymphocytes Relative 12 %   Lymphs Abs 1.5 0.7 - 4.0 K/uL   Monocytes Relative 6 %   Monocytes Absolute 0.8 0.1 - 1.0 K/uL   Eosinophils Relative 0 %   Eosinophils Absolute 0.0 0.0 - 0.5 K/uL   Basophils Relative 0 %   Basophils Absolute 0.0 0.0 - 0.1 K/uL   Immature Granulocytes 1 %   Abs Immature Granulocytes 0.06 0.00 - 0.07 K/uL    Comment: Performed at [facility],  [place] [address], [place], [place] [address]  Protime-INR     Status: None   Collection Time: 06/21/22  3:42 PM  Result Value Ref Range   Prothrombin Time 14.4 11.4 - 15.2 seconds   INR 1.1 0.8 - 1.2    Comment: (NOTE) INR goal varies based on device and disease states. Performed at [facility], [place] [address], [place], [place] [address]   APTT     Status: None   Collection Time: 06/21/22  3:42 PM  Result Value Ref Range   aPTT 31 24 - 36 seconds    Comment: Performed at [facility], [place] [address] [place], [place] [address]  Troponin I (High Sensitivity)     Status: Abnormal   Collection Time: 06/21/22  3:42 PM  Result Value Ref Range   Troponin I (High Sensitivity) 103 (HH) <18 ng/L    Comment: CRITICAL RESULT CALLED TO, READ BACK BY AND VERIFIED WITH RN D LESANE AT 1710 06/21/22 CRUICKSHANK A (NOTE) Elevated high sensitivity troponin I (hsTnI) values and significant  changes across serial measurements may suggest ACS but many other  chronic and acute conditions are known to elevate hsTnI results.  Refer to the "Links" section for chest pain algorithms and additional  guidance. Performed at [facility], [place] [address], [place], [place] [address]   Brain natriuretic peptide     Status: None   Collection Time: 06/21/22  3:42 PM  Result Value Ref Range   B Natriuretic Peptide 77.2 0.0 - 100.0 pg/mL    Comment: Performed at [facility], [place] [address], [place], [place] [address]  Lactic acid, plasma     Status: None   Collection Time: 06/21/22  5:48 PM  Result  Value Ref Range   Lactic Acid, Venous 1.6 0.5 - 1.9 mmol/L    Comment: Performed at [facility], [place] [address], [place], [place] [address]  Troponin I (High Sensitivity)     Status: Abnormal   Collection Time: 06/21/22  5:48 PM  Result Value Ref Range   Troponin I (High Sensitivity) 91 (H) <18 ng/L    Comment:  (NOTE) Elevated high sensitivity troponin I (hsTnI) values and significant  changes across serial measurements may suggest ACS but many other  chronic and acute conditions are known to elevate hsTnI results.  Refer to the "Links" section for chest pain algorithms and additional  guidance. Performed at [facility], [place] [address] [place], [place] [address]   Resp Panel by RT-PCR (Flu A&B, Covid) Anterior Nasal Swab     Status: None   Collection Time: 06/21/22  5:51 PM   Specimen: Anterior Nasal Swab  Result Value Ref Range   SARS Coronavirus 2 by RT PCR NEGATIVE NEGATIVE    Comment: (NOTE) SARS-CoV-2 target nucleic acids are NOT DETECTED.  The SARS-CoV-2 RNA is generally detectable in upper respiratory specimens during the acute phase of infection. The lowest concentration of SARS-CoV-2 viral copies this assay can detect is 138 copies/mL. A negative result does not preclude SARS-Cov-2 infection and should not be used as the sole basis for treatment or other patient management decisions. A negative result may occur with  improper specimen collection/handling, submission of specimen other than nasopharyngeal swab, presence of viral mutation(s) within the areas targeted by this assay, and inadequate number of viral copies(<138 copies/mL). A negative result must be combined with clinical observations, patient history, and epidemiological information. The expected result is Negative.  Fact Sheet for Patients:  EntrepreneurPulse.com.au  Fact Sheet for Healthcare Providers:  IncredibleEmployment.be  This test is no t yet approved or cleared by the [place] FDA and  has been authorized for detection and/or diagnosis of SARS-CoV-2 by FDA under an Emergency Use Authorization (EUA). This EUA will remain  in effect (meaning this test can be used) for the duration of the COVID-19 declaration under Section 564(b)(1) of the Act,  21 U.S.C.section 360bbb-3(b)(1), unless the authorization is terminated  or revoked sooner.       Influenza A by PCR NEGATIVE NEGATIVE   Influenza B by PCR NEGATIVE NEGATIVE    Comment: (NOTE) The Xpert Xpress SARS-CoV-2/FLU/RSV plus assay is intended as an aid in the diagnosis of influenza from Nasopharyngeal swab specimens and should not be used as a sole basis for treatment. Nasal washings and aspirates are unacceptable for Xpert Xpress SARS-CoV-2/FLU/RSV testing.  Fact Sheet for Patients: EntrepreneurPulse.com.au  Fact Sheet for Healthcare Providers: IncredibleEmployment.be  This test is not yet approved or cleared by the [place] FDA and has been authorized for detection and/or diagnosis of SARS-CoV-2 by FDA under an Emergency Use Authorization (EUA). This EUA will remain in effect (meaning this test can be used) for the duration of the COVID-19 declaration under Section 564(b)(1) of the Act, 21 U.S.C. section 360bbb-3(b)(1), unless the authorization is terminated or revoked.  Performed at [facility], [place] [address], [place], [place] [address]   Urinalysis, Routine w reflex microscopic Urine, Clean Catch     Status: Abnormal   Collection Time: 06/21/22  6:24 PM  Result Value Ref Range   Color, Urine YELLOW YELLOW   APPearance CLEAR CLEAR   Specific Gravity, Urine 1.044 (H) 1.005 - 1.030   pH 6.0  5.0 - 8.0   Glucose, UA NEGATIVE NEGATIVE mg/dL   Hgb urine dipstick NEGATIVE NEGATIVE   Bilirubin Urine NEGATIVE NEGATIVE   Ketones, ur 5 (A) NEGATIVE mg/dL   Protein, ur 409 (A) NEGATIVE mg/dL   Nitrite NEGATIVE NEGATIVE   Leukocytes,Ua NEGATIVE NEGATIVE   RBC / HPF 0-5 0 - 5 RBC/hpf   WBC, UA 0-5 0 - 5 WBC/hpf   Bacteria, UA NONE SEEN NONE SEEN   Mucus PRESENT     Comment: Performed at [facility], [address], [place], [place] [address]    VAS [place] LOWER EXTREMITY VENOUS (DVT)  (7a-7p)  Result Date: 06/21/2022  Lower Venous DVT Study Patient Name:  BENTLY WYSS  Date of Exam:   06/21/2022 Medical Rec #: [id number]       Accession #:    [id number] Date of Birth: 04-17-1956        Patient Gender: M Patient Age:   42 years Exam Location:  [facility] Procedure:      VAS [place] LOWER EXTREMITY VENOUS (DVT) Referring Phys: JULIE HAVILAND --------------------------------------------------------------------------------  Indications: Pain. Other Indications: S/p right total knee replacement. Comparison Study: No previous exam noted. Performing Technologist: Magdalene River BS, RVT  Examination Guidelines: A complete evaluation includes B-mode imaging, spectral Doppler, color Doppler, and power Doppler as needed of all accessible portions of each vessel. Bilateral testing is considered an integral part of a complete examination. Limited examinations for reoccurring indications may be performed as noted. The reflux portion of the exam is performed with the patient in reverse Trendelenburg.  +---------+---------------+---------+-----------+----------+-------------------+ RIGHT    CompressibilityPhasicitySpontaneityPropertiesThrombus Aging      +---------+---------------+---------+-----------+----------+-------------------+ CFV      Full           Yes      Yes                                      +---------+---------------+---------+-----------+----------+-------------------+ SFJ      Full                                                             +---------+---------------+---------+-----------+----------+-------------------+ FV Prox  Full                                                             +---------+---------------+---------+-----------+----------+-------------------+ FV Mid   Full                                                             +---------+---------------+---------+-----------+----------+-------------------+ FV DistalFull                                                              +---------+---------------+---------+-----------+----------+-------------------+  PFV      Full                                                             +---------+---------------+---------+-----------+----------+-------------------+ POP      Full                                                             +---------+---------------+---------+-----------+----------+-------------------+ PTV                                                   Not well visualized +---------+---------------+---------+-----------+----------+-------------------+ PERO                                                  Not well visualized +---------+---------------+---------+-----------+----------+-------------------+   Right Technical Findings: Unable to visualize the right calf veins well, cannot rule out DVT.  +----+---------------+---------+-----------+----------+--------------+ LEFTCompressibilityPhasicitySpontaneityPropertiesThrombus Aging +----+---------------+---------+-----------+----------+--------------+ CFV Full           Yes      Yes                                 +----+---------------+---------+-----------+----------+--------------+     Summary: RIGHT: - No evidence of deep vein thrombosis in the lower extremity. No indirect evidence of obstruction proximal to the inguinal ligament. - No cystic structure found in the popliteal fossa. - Normal right where visualized well. Hematoma in the right popliteal fossa, s/p right total knee replacement.  LEFT: - No evidence of common femoral vein obstruction.  *See table(s) above for measurements and observations.    Preliminary    CT Angio Chest PE W and/or Wo Contrast  Result Date: 06/21/2022 CLINICAL DATA:  High probability for pulmonary embolism. Recent knee replacement. Shortness of breath. EXAM: CT ANGIOGRAPHY CHEST WITH CONTRAST TECHNIQUE: Multidetector CT imaging of the chest was  performed using the standard protocol during bolus administration of intravenous contrast. Multiplanar CT image reconstructions and MIPs were obtained to evaluate the vascular anatomy. RADIATION DOSE REDUCTION: This exam was performed according to the departmental dose-optimization program which includes automated exposure control, adjustment of the mA and/or kV according to patient size and/or use of iterative reconstruction technique. CONTRAST:  184mL OMNIPAQUE IOHEXOL 350 MG/ML SOLN COMPARISON:  Perfusion lung scan 02/06/2022. CT angiogram chest 12/13/2021. FINDINGS: Cardiovascular: Satisfactory opacification of the pulmonary arteries to the segmental level. No evidence of pulmonary embolism. Heart is enlarged. There are atherosclerotic calcifications of the aorta. Left-sided dual lead pacemaker is present. No pericardial effusion. Mediastinum/Nodes: No enlarged mediastinal, hilar, or axillary lymph nodes. Thyroid gland, trachea, and esophagus demonstrate no significant findings. Lungs/Pleura: There is minimal atelectasis in the left lung base. The lungs are otherwise clear. No pleural effusion or pneumothorax identified. Upper Abdomen: No acute abnormality. Musculoskeletal: No acute fractures. Review of  the MIP images confirms the above findings. IMPRESSION: 1. No evidence for pulmonary embolism. 2. Cardiomegaly. 3. Minimal left base atelectasis/scarring. Electronically Signed   By: Ronney Asters M.D.   On: 06/21/2022 18:09   DG Chest Port 1 View  Result Date: 06/21/2022 CLINICAL DATA:  Possible sepsis.  Positive blood cultures. EXAM: PORTABLE CHEST 1 VIEW COMPARISON:  06/20/2022 FINDINGS: Patient has LEFT-sided transvenous pacemaker with leads overlying the RIGHT atrium and RIGHT ventricle. Stable cardiomegaly. Lungs are clear. No edema. IMPRESSION: Stable cardiomegaly.  No evidence for acute pulmonary abnormality. Electronically Signed   By: Nolon Nations M.D.   On: 06/21/2022 16:42   DG Knee Complete  4 Views Right  Result Date: 06/20/2022 CLINICAL DATA:  Postoperative fever. Status post right knee arthroplasty 2 days ago. EXAM: RIGHT KNEE - COMPLETE 4+ VIEW COMPARISON:  Right knee radiographs 02/02/2022 FINDINGS: Status post total right knee arthroplasty. Tiny ossific densities just anterior to the proximal tibia likely reflect normal postsurgical change. Expected postoperative changes including moderate joint effusion and mild intra-articular air. No perihardware lucency to indicate hardware failure or loosening. No acute fracture or dislocation. IMPRESSION: 1. Status post total right knee arthroplasty without evidence of hardware failure. 2. Moderate joint effusion and small amount of intra-articular air within normal limits given recent surgery. Cannot exclude a gas forming organism within the joint fluid given indication of postoperative fever. Electronically Signed   By: Yvonne Kendall M.D.   On: 06/20/2022 18:19   DG Chest Port 1 View  Result Date: 06/20/2022 CLINICAL DATA:  Questionable sepsis, evaluate for abnormality in a 66 year old male. EXAM: PORTABLE CHEST 1 VIEW COMPARISON:  February 06, 2022. FINDINGS: EKG leads project over the chest. LEFT-sided dual lead cardiac pacer defibrillator with power pack over the LEFT chest as before. Cardiomediastinal contours are stable with signs of cardiac enlargement and aortic tortuosity. Lungs are clear. No sign of pneumothorax. No sign of pleural effusion on frontal radiograph. On limited assessment there is no acute skeletal finding. IMPRESSION: Cardiomegaly without acute process. Electronically Signed   By: Zetta Bills M.D.   On: 06/20/2022 18:03    Review of Systems: At this time patient denies any shortness of breath or chest pains.  He does report pain in his right knee that is controlled. Blood pressure (!) 149/90, pulse 90, temperature 99.3 F (37.4 C), temperature source Oral, resp. rate (!) 23, SpO2 92 %. Physical Exam: Right knee is held  in 20 degrees of flexion trace palpable effusion dressing has 2 small spots of blood on it minimal bruising around the total knee.  His calf does have erythema with increased warmth and is 1-2+ swollen compared to the left calf.  Toes are pink and well-perfused he is neurovascular intact.  Assessment/Plan: 4 days s/p right total knee arthroplasty with swelling erythema and pain to the right calf and right knee.  1 out of 4 blood culture bottles growing out gram-positive cocci.  Sepsis protocol initiated. Plan: New wound does have some erythema but there is a minimal effusion.  At this time we will monitor hopefully the leg will settle down on the antibiotics.  If he does develop an effusion we will tap it looking for any evidence of infection inside of the total knee.  We will check him again later today.  Patient may be ambulated weightbearing as tolerated using a walker with physical therapy. Kerin Salen 06/22/2022, 6:36 AM

## 2022-06-22 NOTE — Assessment & Plan Note (Addendum)
S/p ICD. No new murmur heard on exam.  -obtain TTE to further assess in the setting of bacteremia

## 2022-06-22 NOTE — Assessment & Plan Note (Signed)
Remains off Eliquis post-operatively. Continue to hold pending further orthopedic recommendations tomorrow.

## 2022-06-22 NOTE — Assessment & Plan Note (Addendum)
-  Sepsis secondary to bacteremia with gram positive rods in aerobic culture following recent right TKA on 06/18/22.  -continue IV vancomycin and cefepime.  -repeat blood cultures pending -ortho Dr. Turner Daniels consulted and will see tomorrow. Keeping NPO past midnight.

## 2022-06-22 NOTE — Assessment & Plan Note (Signed)
Resume home antihypertensives pending med rec 

## 2022-06-22 NOTE — Assessment & Plan Note (Signed)
Suspect secondary to bacteremia.  Chest x-ray is negative. -Continue IV antibiotics as above

## 2022-06-22 NOTE — Progress Notes (Signed)
PROGRESS NOTE    Ryan Glass  ZOX:[id number] DOB: 03-06-1956 DOA: 06/21/2022 PCP: [name], MD   Brief Narrative:  66 y.o. male with medical history significant of HTN, dilated aortic root, PE, Hypertropic cardiomyopathy s/p ICD, CAD, HFrEF,  RA, recent right total knee arthroplasty on 06/18/2022 was admitted for positive blood cultures.  He had presented to the ED on 06/20/2022 with fever and altered mental status and was discharged home after negative work-up but later on was called for admission for positive blood cultures with gram-positive rods growing in blood cultures.  On presentation, he was afebrile, required supplemental oxygen, had leukocytosis with WBCs of 12.3.  COVID-19 and influenza were negative.  UA was negative.  CTA chest was negative for pulmonary embolism.  Bilateral lower extremity ultrasound was negative for DVT.  He was started on broad-spectrum antibiotics.  Orthopedics was consulted.  Assessment & Plan:   Bacteremia: Gram-positive rod bacteremia Sepsis: Present on admission History of recent right total knee arthroplasty -Presented with lactic acidosis, leukocytosis, bacteremia with concern for right knee infection -Currently on IV vancomycin and cefepime.  Blood cultures from 06/18/2022 grew gram-positive rod: Unclear if this is a contaminant.  Follow sensitivities.  Repeat blood cultures from 06/21/2022 are pending. -Orthopedics following.  Follow further recommendations to see if there is any need for surgical intervention to the right knee. -Bilateral lower extremity duplex ultrasound was negative for DVT.  Leukocytosis -Resolved  Paroxysmal A-fib -Remains off Eliquis postoperatively.  Continue to hold Eliquis for now in case patient needs any surgical intervention  Hypertrophic cardiomyopathy status post ICD -Echo has been ordered in the setting of bacteremia.  Outpatient follow-up with cardiology  Elevated troponin -Troponins 103 and 91.  No chest  pain.  Possibly demand ischemia.  No further work-up needed.  Echo pending.  Essential hypertension -Blood pressure on the high side.  Resume home medications.  Acute on chronic respiratory failure with hypoxia -Possible from bacteremia.  CTA chest negative for PE or infiltrates.  Wean off as able.  Incentive spirometry.  Anemia of chronic disease -From chronic illnesses.  Hemoglobin stable.  Monitor intermittently  Obesity -Outpatient follow-up  DVT prophylaxis: Eliquis on hold Code Status: Full Family Communication: Wife at bedside Disposition Plan: Status is: Inpatient Remains inpatient appropriate because: Of severity of illness    Consultants: Orthopedics  Procedures: None  Antimicrobials: Cefepime and vancomycin from 06/21/2022 onwards   Subjective: Patient seen and examined at bedside.  Complains of increasing swelling of right lower extremity.  Denies worsening right knee pain.  No overnight fever or vomiting reported by present at bedside.  Objective: Vitals:   06/22/22 1145 06/22/22 1200 06/22/22 1215 06/22/22 1247  BP: (!) 164/99 (!) 161/98 (!) 168/94   Pulse: 91 89 90 90  Resp: 16 16 (!) 21 20  Temp:    98.5 F (36.9 C)  TempSrc:    Oral  SpO2: 98% 97% 96% 98%  Weight:      Height:        Intake/Output Summary (Last 24 hours) at 06/22/2022 1249 Last data filed at 06/22/2022 0921 Gross per 24 hour  Intake 560.28 ml  Output --  Net 560.28 ml   Filed Weights   06/22/22 0840  Weight: 104.7 kg    Examination:  General exam: Appears calm and comfortable.  Looks chronically ill and deconditioned.  On 4 L oxygen via nasal cannula.  Slow to respond. Respiratory system: Bilateral decreased breath sounds at bases with some scattered  crackles and intermittent tachypnea Cardiovascular system: S1 & S2 heard, Rate controlled Gastrointestinal system: Abdomen is obese, nondistended, soft and nontender. Normal bowel sounds heard. Extremities: No cyanosis,  clubbing; bilateral lower extremity edema present.  Right knee and lower extremity dressing present with slight erythema around incision site Central nervous system: Alert and oriented.  Slow to respond.  Poor historian.  No focal neurological deficits. Moving extremities Skin: No other rashes, lesions or ulcers Psychiatry: Affect is flat.  No signs of agitation.   Data Reviewed: I have personally reviewed following labs and imaging studies  CBC: Recent Labs  Lab 06/20/22 1656 06/21/22 1542 06/22/22 0842  WBC 8.4 12.3* 6.3  NEUTROABS 6.6 10.0* 5.1  HGB 11.1* 12.0* 10.4*  HCT 33.2* 36.4* 31.1*  MCV 94.1 94.1 94.2  PLT 145* 183 138*   Basic Metabolic Panel: Recent Labs  Lab 06/20/22 1656 06/21/22 1542 06/22/22 0842  NA 134* 136 139  K 3.8 4.2 3.6  CL 104 105 108  CO2 21* 20* 23  GLUCOSE 125* 131* 105*  BUN 20 16 16   CREATININE 1.20 1.13 0.95  CALCIUM 9.0 9.4 8.8*  MG  --   --  2.0   GFR: Estimated Creatinine Clearance: 89.7 mL/min (by C-G formula based on SCr of 0.95 mg/dL). Liver Function Tests: Recent Labs  Lab 06/20/22 1656 06/21/22 1542  AST 24 30  ALT 18 19  ALKPHOS 64 76  BILITOT 1.1 1.3*  PROT 6.2* 7.4  ALBUMIN 3.6 4.1   No results for input(s): "LIPASE", "AMYLASE" in the last 168 hours. No results for input(s): "AMMONIA" in the last 168 hours. Coagulation Profile: Recent Labs  Lab 06/20/22 1656 06/21/22 1542  INR 1.3* 1.1   Cardiac Enzymes: No results for input(s): "CKTOTAL", "CKMB", "CKMBINDEX", "TROPONINI" in the last 168 hours. BNP (last 3 results) Recent Labs    09/19/21 0938 12/05/21 1637  PROBNP 355 183   HbA1C: No results for input(s): "HGBA1C" in the last 72 hours. CBG: No results for input(s): "GLUCAP" in the last 168 hours. Lipid Profile: No results for input(s): "CHOL", "HDL", "LDLCALC", "TRIG", "CHOLHDL", "LDLDIRECT" in the last 72 hours. Thyroid Function Tests: No results for input(s): "TSH", "T4TOTAL", "FREET4", "T3FREE",  "THYROIDAB" in the last 72 hours. Anemia Panel: No results for input(s): "VITAMINB12", "FOLATE", "FERRITIN", "TIBC", "IRON", "RETICCTPCT" in the last 72 hours. Sepsis Labs: Recent Labs  Lab 06/20/22 1656 06/21/22 1542 06/21/22 1748  LATICACIDVEN 1.5 2.4* 1.6    Recent Results (from the past 240 hour(s))  Blood Culture (routine x 2)     Status: Abnormal   Collection Time: 06/20/22  4:15 PM   Specimen: BLOOD  Result Value Ref Range Status   Specimen Description   Final    BLOOD SITE NOT SPECIFIED Performed at [facility], [address] [place], [place] [address]    Special Requests   Final    BOTTLES DRAWN AEROBIC AND ANAEROBIC Blood Culture adequate volume Performed at [facility], [address] [place], [place] [address]    Culture  Setup Time   Final    GRAM POSITIVE RODS AEROBIC BOTTLE ONLY CRITICAL RESULT CALLED TO, READ BACK BY AND VERIFIED WITH: RN KELLY NEAL 06/21/22@5 :31 BY TW  Culture (A)  Final    BACILLUS SPECIES Standardized susceptibility testing for this organism is not available. Performed at [facility], [address] [place], [place] [address]    Report Status 06/22/2022 FINAL  Final  Blood Culture (routine x 2)     Status: None (Preliminary result)   Collection Time: 06/20/22  4:24 PM   Specimen: BLOOD  Result Value Ref Range Status   Specimen Description   Final    BLOOD SITE NOT SPECIFIED Performed at [facility], [address], [place], [place] [address]    Special Requests   Final    BOTTLES DRAWN AEROBIC AND ANAEROBIC Blood Culture adequate volume Performed at [facility], [address] [place], [place] [address]    Culture   Final    NO GROWTH 2 DAYS Performed at [facility], [address], [place], [place] [address]    Report Status PENDING  Incomplete  Urine Culture     Status: None   Collection Time: 06/20/22  5:34 PM   Specimen: In/Out Cath Urine  Result Value Ref Range Status   Specimen Description   Final    IN/OUT CATH URINE Performed at [facility], [address] [place], [place] [address]    Special Requests   Final    NONE Performed at [facility], [address] [place], [place] [address]    Culture   Final    NO GROWTH Performed at [facility], [address] [place], [place] [address]    Report Status 06/21/2022 FINAL  Final  Blood Culture (routine x 2)     Status: None (Preliminary result)   Collection Time: 06/21/22  4:10 PM   Specimen: BLOOD  Result Value Ref Range Status   Specimen Description   Final    BLOOD SITE NOT SPECIFIED Performed at [facility], [address] [place], [place] [address]    Special Requests   Final    BOTTLES DRAWN AEROBIC AND ANAEROBIC Blood Culture adequate volume Performed at [facility], [address] [place], [place] [address]    Culture   Final    NO GROWTH < 12 HOURS Performed at [facility], [address] [place], [place] [address]    Report Status PENDING  Incomplete  Resp Panel by RT-PCR (Flu A&B, Covid) Anterior Nasal Swab     Status: None   Collection Time: 06/21/22  5:51 PM   Specimen: Anterior Nasal Swab  Result Value Ref Range Status   SARS Coronavirus 2 by RT PCR NEGATIVE NEGATIVE Final    Comment: (NOTE) SARS-CoV-2 target nucleic acids are NOT DETECTED.  The SARS-CoV-2 RNA is generally detectable in upper respiratory specimens during the acute phase of infection. The lowest concentration of SARS-CoV-2 viral copies this assay can detect is 138 copies/mL. A negative result does not preclude SARS-Cov-2 infection and should not be used as the sole basis for treatment  or other patient management decisions. A negative result may occur with  improper specimen collection/handling, submission of specimen other than nasopharyngeal swab, presence of viral mutation(s) within the areas targeted by this assay, and inadequate number of viral copies(<138 copies/mL). A negative result must be combined with clinical observations, patient history, and epidemiological information. The expected result is Negative.  Fact Sheet for Patients:  BloggerCourse.com  Fact Sheet for Healthcare Providers:  SeriousBroker.it  This test is no  t yet approved or cleared by the [place] and  has been authorized for detection and/or diagnosis of SARS-CoV-2 by FDA under an Emergency Use Authorization (EUA). This EUA will remain  in effect (meaning this test can be used) for the duration of the COVID-19 declaration under Section 564(b)(1) of the Act, 21 U.S.C.section 360bbb-3(b)(1), unless the authorization is terminated  or revoked sooner.       Influenza A by PCR NEGATIVE NEGATIVE Final   Influenza B by PCR NEGATIVE NEGATIVE Final    Comment: (NOTE) The Xpert Xpress SARS-CoV-2/FLU/RSV plus assay is intended as an aid in the diagnosis of influenza from Nasopharyngeal swab specimens and should not be used as a sole basis for treatment. Nasal washings and aspirates are unacceptable for Xpert Xpress SARS-CoV-2/FLU/RSV testing.  Fact Sheet for Patients: BloggerCourse.com  Fact Sheet for Healthcare Providers: SeriousBroker.it  This test is not yet approved or cleared by the [place] FDA and has been authorized for detection and/or diagnosis of SARS-CoV-2 by FDA under an Emergency Use Authorization (EUA). This EUA will remain in effect (meaning this test can be used) for the duration of the COVID-19 declaration under Section 564(b)(1) of the Act, 21 U.S.C. section  360bbb-3(b)(1), unless the authorization is terminated or revoked.  Performed at [facility], [address], [place], [place] [address]          Radiology Studies: VAS [place] LOWER EXTREMITY VENOUS (DVT) (7a-7p)  Result Date: 06/21/2022  Lower Venous DVT Study Patient Name:  FRUTOSO DIMARE  Date of Exam:   06/21/2022 Medical Rec #: [id number]       Accession #:    [id number] Date of Birth: 1956-07-02        Patient Gender: M Patient Age:   89 years Exam Location:  [facility] [place] [facility] Procedure:      VAS [place] LOWER EXTREMITY VENOUS (DVT) Referring Phys: JULIE HAVILAND --------------------------------------------------------------------------------  Indications: Pain. Other Indications: S/p right total knee replacement. Comparison Study: No previous exam noted. Performing Technologist: Magdalene River BS, RVT  Examination Guidelines: A complete evaluation includes B-mode imaging, spectral Doppler, color Doppler, and power Doppler as needed of all accessible portions of each vessel. Bilateral testing is considered an integral part of a complete examination. Limited examinations for reoccurring indications may be performed as noted. The reflux portion of the exam is performed with the patient in reverse Trendelenburg.  +---------+---------------+---------+-----------+----------+-------------------+ RIGHT    CompressibilityPhasicitySpontaneityPropertiesThrombus Aging      +---------+---------------+---------+-----------+----------+-------------------+ CFV      Full           Yes      Yes                                      +---------+---------------+---------+-----------+----------+-------------------+ SFJ      Full                                                             +---------+---------------+---------+-----------+----------+-------------------+ FV Prox  Full                                                              +---------+---------------+---------+-----------+----------+-------------------+  FV Mid   Full                                                             +---------+---------------+---------+-----------+----------+-------------------+ FV DistalFull                                                             +---------+---------------+---------+-----------+----------+-------------------+ PFV      Full                                                             +---------+---------------+---------+-----------+----------+-------------------+ POP      Full                                                             +---------+---------------+---------+-----------+----------+-------------------+ PTV                                                   Not well visualized +---------+---------------+---------+-----------+----------+-------------------+ PERO                                                  Not well visualized +---------+---------------+---------+-----------+----------+-------------------+   Right Technical Findings: Unable to visualize the right calf veins well, cannot rule out DVT.  +----+---------------+---------+-----------+----------+--------------+ LEFTCompressibilityPhasicitySpontaneityPropertiesThrombus Aging +----+---------------+---------+-----------+----------+--------------+ CFV Full           Yes      Yes                                 +----+---------------+---------+-----------+----------+--------------+     Summary: RIGHT: - No evidence of deep vein thrombosis in the lower extremity. No indirect evidence of obstruction proximal to the inguinal ligament. - No cystic structure found in the popliteal fossa. - Normal right where visualized well. Hematoma in the right popliteal fossa, s/p right total knee replacement.  LEFT: - No evidence of common femoral vein obstruction.  *See table(s) above for measurements and observations.    Preliminary     CT Angio Chest PE W and/or Wo Contrast  Result Date: 06/21/2022 CLINICAL DATA:  High probability for pulmonary embolism. Recent knee replacement. Shortness of breath. EXAM: CT ANGIOGRAPHY CHEST WITH CONTRAST TECHNIQUE: Multidetector CT imaging of the chest was performed using the standard protocol during bolus administration of intravenous contrast. Multiplanar CT image reconstructions and MIPs were obtained to evaluate the vascular anatomy. RADIATION DOSE REDUCTION: This exam was performed according to  the departmental dose-optimization program which includes automated exposure control, adjustment of the mA and/or kV according to patient size and/or use of iterative reconstruction technique. CONTRAST:  OMNIPAQUE IOHEXOL 350 MG/ML SOLN COMPARISON:  Perfusion lung scan 02/06/2022. CT angiogram chest 12/13/2021. FINDINGS: Cardiovascular: Satisfactory opacification of the pulmonary arteries to the segmental level. No evidence of pulmonary embolism. Heart is enlarged. There are atherosclerotic calcifications of the aorta. Left-sided dual lead pacemaker is present. No pericardial effusion. Mediastinum/Nodes: No enlarged mediastinal, hilar, or axillary lymph nodes. Thyroid gland, trachea, and esophagus demonstrate no significant findings. Lungs/Pleura: There is minimal atelectasis in the left lung base. The lungs are otherwise clear. No pleural effusion or pneumothorax identified. Upper Abdomen: No acute abnormality. Musculoskeletal: No acute fractures. Review of the MIP images confirms the above findings. IMPRESSION: 1. No evidence for pulmonary embolism. 2. Cardiomegaly. 3. Minimal left base atelectasis/scarring. Electronically Signed   By: Darliss Cheney M.D.   On: 06/21/2022 18:09   DG Chest Port 1 View  Result Date: 06/21/2022 CLINICAL DATA:  Possible sepsis.  Positive blood cultures. EXAM: PORTABLE CHEST 1 VIEW COMPARISON:  06/20/2022 FINDINGS: Patient has LEFT-sided transvenous pacemaker with leads  overlying the RIGHT atrium and RIGHT ventricle. Stable cardiomegaly. Lungs are clear. No edema. IMPRESSION: Stable cardiomegaly.  No evidence for acute pulmonary abnormality. Electronically Signed   By: Norva Pavlov M.D.   On: 06/21/2022 16:42   DG Knee Complete 4 Views Right  Result Date: 06/20/2022 CLINICAL DATA:  Postoperative fever. Status post right knee arthroplasty 2 days ago. EXAM: RIGHT KNEE - COMPLETE 4+ VIEW COMPARISON:  Right knee radiographs 02/02/2022 FINDINGS: Status post total right knee arthroplasty. Tiny ossific densities just anterior to the proximal tibia likely reflect normal postsurgical change. Expected postoperative changes including moderate joint effusion and mild intra-articular air. No perihardware lucency to indicate hardware failure or loosening. No acute fracture or dislocation. IMPRESSION: 1. Status post total right knee arthroplasty without evidence of hardware failure. 2. Moderate joint effusion and small amount of intra-articular air within normal limits given recent surgery. Cannot exclude a gas forming organism within the joint fluid given indication of postoperative fever. Electronically Signed   By: Neita Garnet M.D.   On: 06/20/2022 18:19   DG Chest Port 1 View  Result Date: 06/20/2022 CLINICAL DATA:  Questionable sepsis, evaluate for abnormality in a 66 year old male. EXAM: PORTABLE CHEST 1 VIEW COMPARISON:  February 06, 2022. FINDINGS: EKG leads project over the chest. LEFT-sided dual lead cardiac pacer defibrillator with power pack over the LEFT chest as before. Cardiomediastinal contours are stable with signs of cardiac enlargement and aortic tortuosity. Lungs are clear. No sign of pneumothorax. No sign of pleural effusion on frontal radiograph. On limited assessment there is no acute skeletal finding. IMPRESSION: Cardiomegaly without acute process. Electronically Signed   By: Donzetta Kohut M.D.   On: 06/20/2022 18:03        Scheduled Meds:   polyethylene glycol  17 g Oral Daily   senna-docusate  1 tablet Oral QHS   Continuous Infusions:  ceFEPime (MAXIPIME) IV Stopped (06/22/22 9528)   vancomycin            Ryan Lloyd, MD [facility] 06/22/2022, 12:49 PM

## 2022-06-22 NOTE — Assessment & Plan Note (Signed)
Downward trending 103 to 91. Suspect demand ischemia.

## 2022-06-23 ENCOUNTER — Inpatient Hospital Stay (HOSPITAL_COMMUNITY): Payer: No Typology Code available for payment source

## 2022-06-23 DIAGNOSIS — R778 Other specified abnormalities of plasma proteins: Secondary | ICD-10-CM | POA: Diagnosis not present

## 2022-06-23 DIAGNOSIS — I480 Paroxysmal atrial fibrillation: Secondary | ICD-10-CM | POA: Diagnosis not present

## 2022-06-23 DIAGNOSIS — R7881 Bacteremia: Secondary | ICD-10-CM

## 2022-06-23 DIAGNOSIS — J9621 Acute and chronic respiratory failure with hypoxia: Secondary | ICD-10-CM | POA: Diagnosis not present

## 2022-06-23 LAB — COMPREHENSIVE METABOLIC PANEL
ALT: 22 U/L (ref 0–44)
AST: 24 U/L (ref 15–41)
Albumin: 3 g/dL — ABNORMAL LOW (ref 3.5–5.0)
Alkaline Phosphatase: 68 U/L (ref 38–126)
Anion gap: 11 (ref 5–15)
BUN: 22 mg/dL (ref 8–23)
CO2: 22 mmol/L (ref 22–32)
Calcium: 8.8 mg/dL — ABNORMAL LOW (ref 8.9–10.3)
Chloride: 105 mmol/L (ref 98–111)
Creatinine, Ser: 1.03 mg/dL (ref 0.61–1.24)
GFR, Estimated: >60 mL/min (ref >60)
Glucose, Bld: 113 mg/dL — ABNORMAL HIGH (ref 70–99)
Potassium: 3.4 mmol/L — ABNORMAL LOW (ref 3.5–5.1)
Sodium: 138 mmol/L (ref 135–145)
Total Bilirubin: 1 mg/dL (ref 0.3–1.2)
Total Protein: 6 g/dL — ABNORMAL LOW (ref 6.5–8.1)

## 2022-06-23 LAB — CBC WITH DIFFERENTIAL/PLATELET
Abs Immature Granulocytes: 0.02 10*3/uL (ref 0.00–0.07)
Basophils Absolute: 0 10*3/uL (ref 0.0–0.1)
Basophils Relative: 0 %
Eosinophils Absolute: 0 10*3/uL (ref 0.0–0.5)
Eosinophils Relative: 0 %
HCT: 31.9 % — ABNORMAL LOW (ref 39.0–52.0)
Hemoglobin: 10.5 g/dL — ABNORMAL LOW (ref 13.0–17.0)
Immature Granulocytes: 0 %
Lymphocytes Relative: 11 %
Lymphs Abs: 0.8 10*3/uL (ref 0.7–4.0)
MCH: 30.8 pg (ref 26.0–34.0)
MCHC: 32.9 g/dL (ref 30.0–36.0)
MCV: 93.5 fL (ref 80.0–100.0)
Monocytes Absolute: 0.7 10*3/uL (ref 0.1–1.0)
Monocytes Relative: 9 %
Neutro Abs: 5.9 10*3/uL (ref 1.7–7.7)
Neutrophils Relative %: 80 %
Platelets: 169 10*3/uL (ref 150–400)
RBC: 3.41 MIL/uL — ABNORMAL LOW (ref 4.22–5.81)
RDW: 13.5 % (ref 11.5–15.5)
WBC: 7.5 10*3/uL (ref 4.0–10.5)
nRBC: 0 % (ref 0.0–0.2)

## 2022-06-23 LAB — C-REACTIVE PROTEIN: CRP: 23.3 mg/dL — ABNORMAL HIGH (ref <1.0)

## 2022-06-23 LAB — ECHOCARDIOGRAM COMPLETE
Area-P 1/2: 4.36 cm2
Height: 68 in
S' Lateral: 2.6 cm
Weight: 3693.15 oz

## 2022-06-23 LAB — MAGNESIUM: Magnesium: 2.1 mg/dL (ref 1.7–2.4)

## 2022-06-23 MED ORDER — COLCHICINE 0.6 MG PO TABS
0.6000 mg | ORAL_TABLET | Freq: Once | ORAL | Status: AC
Start: 2022-06-23 — End: 2022-06-23
  Administered 2022-06-23: 0.6 mg via ORAL
  Filled 2022-06-23: qty 1

## 2022-06-23 MED ORDER — ALLOPURINOL 100 MG PO TABS
100.0000 mg | ORAL_TABLET | Freq: Every day | ORAL | Status: DC
  Administered 2022-06-23 – 2022-06-28 (×6): 100 mg via ORAL
  Filled 2022-06-23 (×6): qty 1

## 2022-06-23 MED ORDER — METRONIDAZOLE 500 MG/100ML IV SOLN
500.0000 mg | Freq: Two times a day (BID) | INTRAVENOUS | Status: DC
  Administered 2022-06-23 (×2): 500 mg via INTRAVENOUS
  Filled 2022-06-23 (×2): qty 100

## 2022-06-23 MED ORDER — APIXABAN 2.5 MG PO TABS
2.5000 mg | ORAL_TABLET | Freq: Two times a day (BID) | ORAL | Status: DC
  Administered 2022-06-23 – 2022-06-28 (×11): 2.5 mg via ORAL
  Filled 2022-06-23 (×11): qty 1

## 2022-06-23 MED ORDER — POTASSIUM CHLORIDE CRYS ER 20 MEQ PO TBCR
40.0000 meq | EXTENDED_RELEASE_TABLET | Freq: Once | ORAL | Status: AC
  Administered 2022-06-23: 40 meq via ORAL
  Filled 2022-06-23: qty 2

## 2022-06-23 MED ORDER — COLCHICINE 0.6 MG PO TABS
0.6000 mg | ORAL_TABLET | Freq: Two times a day (BID) | ORAL | Status: DC | PRN
  Administered 2022-06-23 – 2022-06-27 (×8): 0.6 mg via ORAL
  Filled 2022-06-23 (×8): qty 1

## 2022-06-23 NOTE — Telephone Encounter (Signed)
Reviewed Epic patient still admitted repeat blood cultures drawn Dr Turner Daniels saw patient and started on colchicine considering cultures contaminated at this point/redone.  May be gout flare again no surgery anticipated at this time.

## 2022-06-23 NOTE — Progress Notes (Signed)
PROGRESS NOTE    Ryan Glass  [id number] DOB: 10/09/1956 DOA: 06/21/2022 PCP: [name], MD   Brief Narrative:  66 y.o. male with medical history significant of HTN, dilated aortic root, PE, Hypertropic cardiomyopathy s/p ICD, CAD, HFrEF,  RA, recent right total knee arthroplasty on 06/18/2022 was admitted for positive blood cultures.  He had presented to the ED on 06/20/2022 with fever and altered mental status and was discharged home after negative work-up but later on was called for admission for positive blood cultures with gram-positive rods growing in blood cultures.  On presentation, he was afebrile, required supplemental oxygen, had leukocytosis with WBCs of 12.3.  COVID-19 and influenza were negative.  UA was negative.  CTA chest was negative for pulmonary embolism.  Bilateral lower extremity ultrasound was negative for DVT.  He was started on broad-spectrum antibiotics.  Orthopedics was consulted.  Assessment & Plan:   Bacteremia: Gram-positive rod bacteremia Sepsis: Present on admission History of recent right total knee arthroplasty -Presented with lactic acidosis, leukocytosis, bacteremia with concern for right knee infection -Currently on IV vancomycin and cefepime.  Blood cultures from 06/20/2022 grew Bacillus species.  Possibly contaminant. Repeat blood cultures from 06/21/2022 are negative so far. -Orthopedics following.  Currently no plans for any surgical intervention. -Bilateral lower extremity duplex ultrasound was negative for DVT. -CRP elevated at 23.3 today.  Right knee pain -?  Acute gout flare as well.  Colchicine has been started by orthopedics.  We will also start allopurinol.  Leukocytosis -Resolved  Paroxysmal A-fib -Remains off Eliquis postoperatively.  Continue to hold Eliquis for now in case patient needs any surgical intervention  Hypertrophic cardiomyopathy status post ICD -Echo has been ordered in the setting of bacteremia.  Outpatient  follow-up with cardiology  Elevated troponin -Troponins 103 and 91.  No chest pain.  Possibly demand ischemia.  No further work-up needed.  Echo pending.  Hypokalemia -Replace.  Repeat a.m. labs  Essential hypertension -Blood pressure on the high side.  Continue hydralazine and spironolactone.  Monitor blood pressure.  Acute on chronic respiratory failure with hypoxia -Possible from bacteremia.  CTA chest negative for PE or infiltrates.  Currently on room air.  Incentive spirometry.  Anemia of chronic disease -From chronic illnesses.  Hemoglobin stable.  Monitor intermittently  Obesity -Outpatient follow-up  DVT prophylaxis: Eliquis on hold Code Status: Full Family Communication: Wife at bedside Disposition Plan: Status is: Inpatient Remains inpatient appropriate because: Of severity of illness    Consultants: Orthopedics  Procedures: None  Antimicrobials: Cefepime and vancomycin from 06/21/2022 onwards   Subjective: Patient seen and examined at bedside.  Complains of right knee pain but slightly better.  Able to wiggle his right toes better now. No overnight fever, vomiting, chest pain or worsening shortness of breath reported. Objective: Vitals:   06/22/22 1847 06/22/22 2212 06/23/22 0139 06/23/22 0456  BP:  140/89 (!) 141/86 (!) 151/74  Pulse:  (!) 107 92 62  Resp:  18 20 18   Temp: 98.6 F (37 C) 99.8 F (37.7 C) 99.4 F (37.4 C) 98.2 F (36.8 C)  TempSrc:  Oral Oral Oral  SpO2:  95% 94% 98%  Weight:      Height:        Intake/Output Summary (Last 24 hours) at 06/23/2022 0805 Last data filed at 06/23/2022 0600 Gross per 24 hour  Intake 1329.19 ml  Output 3200 ml  Net -1870.81 ml    Filed Weights   06/22/22 0840  Weight: 104.7 kg  Examination:  General: On room air.  No distress.  Chronically ill and deconditioned. ENT/neck: No thyromegaly.  JVD is not elevated  respiratory: Decreased breath sounds at bases bilaterally with some crackles; no  wheezing  CVS: S1-S2 heard, rate controlled currently. normal bowel sounds are heard Extremities: No clubbing; right lower extremity edema present.  Right knee and lower extremity dressing present with slight erythema around incision site CNS: Awake and alert.  Still slow to respond.  No focal neurologic deficit.  Moves extremities Lymph: No obvious lymphadenopathy Skin: No obvious ecchymosis/lesions  psych: Affect, judgment and mood are normal  musculoskeletal: No obvious other joint swelling/deformity    Data Reviewed: I have personally reviewed following labs and imaging studies  CBC: Recent Labs  Lab 06/20/22 1656 06/21/22 1542 06/22/22 0842 06/23/22 0400  WBC 8.4 12.3* 6.3 7.5  NEUTROABS 6.6 10.0* 5.1 5.9  HGB 11.1* 12.0* 10.4* 10.5*  HCT 33.2* 36.4* 31.1* 31.9*  MCV 94.1 94.1 94.2 93.5  PLT 145* 183 138* [id number]    Basic Metabolic Panel: Recent Labs  Lab 06/20/22 1656 06/21/22 1542 06/22/22 0842 06/23/22 0400  NA 134* 136 139 138  K 3.8 4.2 3.6 3.4*  CL 104 105 108 105  CO2 21* 20* 23 22  GLUCOSE 125* 131* 105* 113*  BUN 20 16 16 22   CREATININE 1.20 1.13 0.95 1.03  CALCIUM 9.0 9.4 8.8* 8.8*  MG  --   --  2.0 2.1    GFR: Estimated Creatinine Clearance: 82.7 mL/min (by C-G formula based on SCr of 1.03 mg/dL). Liver Function Tests: Recent Labs  Lab 06/20/22 1656 06/21/22 1542 06/23/22 0400  AST 24 30 24   ALT 18 19 22   ALKPHOS 64 76 68  BILITOT 1.1 1.3* 1.0  PROT 6.2* 7.4 6.0*  ALBUMIN 3.6 4.1 3.0*    No results for input(s): "LIPASE", "AMYLASE" in the last 168 hours. No results for input(s): "AMMONIA" in the last 168 hours. Coagulation Profile: Recent Labs  Lab 06/20/22 1656 06/21/22 1542  INR 1.3* 1.1    Cardiac Enzymes: No results for input(s): "CKTOTAL", "CKMB", "CKMBINDEX", "TROPONINI" in the last 168 hours. BNP (last 3 results) Recent Labs    09/19/21 0938 12/05/21 1637  PROBNP 355 183    HbA1C: No results for input(s): "HGBA1C" in  the last 72 hours. CBG: No results for input(s): "GLUCAP" in the last 168 hours. Lipid Profile: No results for input(s): "CHOL", "HDL", "LDLCALC", "TRIG", "CHOLHDL", "LDLDIRECT" in the last 72 hours. Thyroid Function Tests: No results for input(s): "TSH", "T4TOTAL", "FREET4", "T3FREE", "THYROIDAB" in the last 72 hours. Anemia Panel: No results for input(s): "VITAMINB12", "FOLATE", "FERRITIN", "TIBC", "IRON", "RETICCTPCT" in the last 72 hours. Sepsis Labs: Recent Labs  Lab 06/20/22 1656 06/21/22 1542 06/21/22 1748  LATICACIDVEN 1.5 2.4* 1.6     Recent Results (from the past 240 hour(s))  Blood Culture (routine x 2)     Status: Abnormal   Collection Time: 06/20/22  4:15 PM   Specimen: BLOOD  Result Value Ref [place] Status   Specimen Description   Final    BLOOD SITE NOT SPECIFIED Performed at [facility] [address], [place], [place] [address]    Special Requests   Final    BOTTLES DRAWN AEROBIC AND ANAEROBIC Blood Culture adequate volume Performed at [facility] [address], [place], [place] [address]    Culture  Setup Time   Final    GRAM POSITIVE RODS AEROBIC BOTTLE ONLY CRITICAL RESULT  CALLED TO, READ BACK BY AND VERIFIED WITH: RN KELLY NEAL 06/21/22@5 :31 BY TW                                                                                                                                                                               Culture (A)  Final    BACILLUS SPECIES Standardized susceptibility testing for this organism is not available. Performed at [facility], [address] [place], [place] [address]    Report Status 06/22/2022 FINAL  Final  Blood Culture (routine x 2)     Status: None (Preliminary result)   Collection Time: 06/20/22  4:24 PM   Specimen: BLOOD  Result Value Ref [place] Status   Specimen Description   Final    BLOOD SITE NOT SPECIFIED Performed at [facility], [address], [place], [place] [address]    Special Requests   Final    BOTTLES DRAWN AEROBIC AND ANAEROBIC Blood Culture adequate volume Performed at [facility], [address] [place], [place] [address]    Culture   Final    NO GROWTH 2 DAYS Performed at [facility], [address] [place], [place] [address]    Report Status PENDING  Incomplete  Urine Culture     Status: None   Collection Time: 06/20/22  5:34 PM   Specimen: In/Out Cath Urine  Result Value Ref [place] Status   Specimen Description   Final    IN/OUT CATH URINE Performed at [facility], [address], [place], [place] [address]    Special Requests   Final    NONE Performed at [facility], [address], [place], [place] [address]    Culture   Final    NO GROWTH Performed at [facility], [address], [place], [place] [address]    Report Status 06/21/2022 FINAL  Final  Blood Culture (routine x 2)     Status: None (Preliminary result)   Collection Time: 06/21/22  4:10 PM   Specimen: BLOOD  Result Value Ref [place] Status   Specimen Description   Final    BLOOD SITE NOT SPECIFIED Performed at [facility], [address] [place], [place] [address]    Special Requests   Final    BOTTLES DRAWN AEROBIC AND ANAEROBIC Blood Culture adequate volume Performed at [facility], [address], [place], [place] [address]    Culture   Final    NO GROWTH < 12 HOURS Performed at [facility], [address], [place], [place] [address]    Report Status PENDING  Incomplete  Resp  Panel by RT-PCR (Flu A&B, Covid) Anterior Nasal Swab     Status: None   Collection Time: 06/21/22  5:51 PM   Specimen: Anterior Nasal Swab  Result Value Ref [place] Status   SARS Coronavirus 2 by RT PCR NEGATIVE NEGATIVE Final    Comment: (NOTE) SARS-CoV-2 target nucleic acids are NOT DETECTED.  The SARS-CoV-2 RNA is generally  detectable in upper respiratory specimens during the acute phase of infection. The lowest concentration of SARS-CoV-2 viral copies this assay can detect is 138 copies/mL. A negative result does not preclude SARS-Cov-2 infection and should not be used as the sole basis for treatment or other patient management decisions. A negative result may occur with  improper specimen collection/handling, submission of specimen other than nasopharyngeal swab, presence of viral mutation(s) within the areas targeted by this assay, and inadequate number of viral copies(<138 copies/mL). A negative result must be combined with clinical observations, patient history, and epidemiological information. The expected result is Negative.  Fact Sheet for Patients:  EntrepreneurPulse.com.au  Fact Sheet for Healthcare Providers:  IncredibleEmployment.be  This test is no t yet approved or cleared by the [place] FDA and  has been authorized for detection and/or diagnosis of SARS-CoV-2 by FDA under an Emergency Use Authorization (EUA). This EUA will remain  in effect (meaning this test can be used) for the duration of the COVID-19 declaration under Section 564(b)(1) of the Act, 21 U.S.C.section 360bbb-3(b)(1), unless the authorization is terminated  or revoked sooner.       Influenza A by PCR NEGATIVE NEGATIVE Final   Influenza B by PCR NEGATIVE NEGATIVE Final    Comment: (NOTE) The Xpert Xpress SARS-CoV-2/FLU/RSV plus assay is intended as an aid in the diagnosis of influenza from Nasopharyngeal swab specimens and should not be used as a sole basis for treatment. Nasal washings and aspirates are unacceptable for Xpert Xpress SARS-CoV-2/FLU/RSV testing.  Fact Sheet for Patients: EntrepreneurPulse.com.au  Fact Sheet for Healthcare Providers: IncredibleEmployment.be  This test is not yet approved or cleared by the [place] FDA  and has been authorized for detection and/or diagnosis of SARS-CoV-2 by FDA under an Emergency Use Authorization (EUA). This EUA will remain in effect (meaning this test can be used) for the duration of the COVID-19 declaration under Section 564(b)(1) of the Act, 21 U.S.C. section 360bbb-3(b)(1), unless the authorization is terminated or revoked.  Performed at [facility], [place] [address], [place], [place] [address]   Urine Culture     Status: None   Collection Time: 06/21/22  6:24 PM   Specimen: In/Out Cath Urine  Result Value Ref [place] Status   Specimen Description   Final    IN/OUT CATH URINE Performed at [place] [address] [place], [place] [address]    Special Requests   Final    NONE Performed at [facility], [place] [address] [place], [place] [address]    Culture   Final    NO GROWTH Performed at [facility], [place] [address], [place], [place] [address]    Report Status 06/22/2022 FINAL  Final         Radiology Studies: VAS [place] LOWER EXTREMITY VENOUS (DVT) (7a-7p)  Result Date: 06/23/2022  Lower Venous DVT Study Patient Name:  Ryan Glass  Date of Exam:   06/21/2022 Medical Rec #: [id number]       Accession #:    [id number] Date of Birth: 1956/02/21  Patient Gender: M Patient Age:   87 years Exam Location:  [facility] Procedure:      VAS [place] LOWER EXTREMITY VENOUS (DVT) Referring Phys: JULIE HAVILAND --------------------------------------------------------------------------------  Indications: Pain. Other Indications: S/p right total knee replacement. Comparison Study: No previous exam noted. Performing Technologist: Bobetta Lime BS, RVT  Examination Guidelines: A complete evaluation includes B-mode imaging, spectral Doppler, color Doppler, and power Doppler as needed of all accessible portions of each vessel. Bilateral testing is considered an integral part of a complete examination.  Limited examinations for reoccurring indications may be performed as noted. The reflux portion of the exam is performed with the patient in reverse Trendelenburg.  +---------+---------------+---------+-----------+----------+-------------------+ RIGHT    CompressibilityPhasicitySpontaneityPropertiesThrombus Aging      +---------+---------------+---------+-----------+----------+-------------------+ CFV      Full           Yes      Yes                                      +---------+---------------+---------+-----------+----------+-------------------+ SFJ      Full                                                             +---------+---------------+---------+-----------+----------+-------------------+ FV Prox  Full                                                             +---------+---------------+---------+-----------+----------+-------------------+ FV Mid   Full                                                             +---------+---------------+---------+-----------+----------+-------------------+ FV DistalFull                                                             +---------+---------------+---------+-----------+----------+-------------------+ PFV      Full                                                             +---------+---------------+---------+-----------+----------+-------------------+ POP      Full                                                             +---------+---------------+---------+-----------+----------+-------------------+ PTV  Not well visualized +---------+---------------+---------+-----------+----------+-------------------+ PERO                                                  Not well visualized +---------+---------------+---------+-----------+----------+-------------------+   Right Technical Findings: Unable to visualize the right calf veins well, cannot  rule out DVT.  +----+---------------+---------+-----------+----------+--------------+ LEFTCompressibilityPhasicitySpontaneityPropertiesThrombus Aging +----+---------------+---------+-----------+----------+--------------+ CFV Full           Yes      Yes                                 +----+---------------+---------+-----------+----------+--------------+     Summary: RIGHT: - No evidence of deep vein thrombosis in the lower extremity. No indirect evidence of obstruction proximal to the inguinal ligament. - No cystic structure found in the popliteal fossa. - Normal right where visualized well. Hematoma in the right popliteal fossa, s/p right total knee replacement.  LEFT: - No evidence of common femoral vein obstruction.  *See table(s) above for measurements and observations. Electronically signed by Harold Barban MD on 06/23/2022 at 12:48:18 AM.    Final    CT Angio Chest PE W and/or Wo Contrast  Result Date: 06/21/2022 CLINICAL DATA:  High probability for pulmonary embolism. Recent knee replacement. Shortness of breath. EXAM: CT ANGIOGRAPHY CHEST WITH CONTRAST TECHNIQUE: Multidetector CT imaging of the chest was performed using the standard protocol during bolus administration of intravenous contrast. Multiplanar CT image reconstructions and MIPs were obtained to evaluate the vascular anatomy. RADIATION DOSE REDUCTION: This exam was performed according to the departmental dose-optimization program which includes automated exposure control, adjustment of the mA and/or kV according to patient size and/or use of iterative reconstruction technique. CONTRAST:  117mL OMNIPAQUE IOHEXOL 350 MG/ML SOLN COMPARISON:  Perfusion lung scan 02/06/2022. CT angiogram chest 12/13/2021. FINDINGS: Cardiovascular: Satisfactory opacification of the pulmonary arteries to the segmental level. No evidence of pulmonary embolism. Heart is enlarged. There are atherosclerotic calcifications of the aorta. Left-sided dual lead  pacemaker is present. No pericardial effusion. Mediastinum/Nodes: No enlarged mediastinal, hilar, or axillary lymph nodes. Thyroid gland, trachea, and esophagus demonstrate no significant findings. Lungs/Pleura: There is minimal atelectasis in the left lung base. The lungs are otherwise clear. No pleural effusion or pneumothorax identified. Upper Abdomen: No acute abnormality. Musculoskeletal: No acute fractures. Review of the MIP images confirms the above findings. IMPRESSION: 1. No evidence for pulmonary embolism. 2. Cardiomegaly. 3. Minimal left base atelectasis/scarring. Electronically Signed   By: Ronney Asters M.D.   On: 06/21/2022 18:09   DG Chest Port 1 View  Result Date: 06/21/2022 CLINICAL DATA:  Possible sepsis.  Positive blood cultures. EXAM: PORTABLE CHEST 1 VIEW COMPARISON:  06/20/2022 FINDINGS: Patient has LEFT-sided transvenous pacemaker with leads overlying the RIGHT atrium and RIGHT ventricle. Stable cardiomegaly. Lungs are clear. No edema. IMPRESSION: Stable cardiomegaly.  No evidence for acute pulmonary abnormality. Electronically Signed   By: Nolon Nations M.D.   On: 06/21/2022 16:42        Scheduled Meds:  hydrALAZINE  100 mg Oral TID   montelukast  10 mg Oral QHS   polyethylene glycol  17 g Oral Daily   senna-docusate  1 tablet Oral QHS   spironolactone  25 mg Oral Daily   Continuous Infusions:  ceFEPime (MAXIPIME) IV 2 g (06/23/22 0100)   vancomycin 1,500 mg (  06/22/22 1641)          Glade Lloyd, MD [facility] 06/23/2022, 8:05 AM

## 2022-06-23 NOTE — Progress Notes (Signed)
PATIENT ID: Ryan Glass  MRN: [id number]  DOB/AGE:  66-Mar-1957 / 66 y.o.        PROGRESS NOTE Subjective: Patient is alert, oriented, no Nausea, no Vomiting, yes passing gas. Taking PO well. Denies SOB, Chest or Calf Pain. Using Incentive Spirometer, PAS in place. Ambulate WBAT, Patient reports pain as 8/10. .    Objective: Vital signs in last 24 hours: Vitals:   06/22/22 1847 06/22/22 2212 06/23/22 0139 06/23/22 0456  BP:  140/89 (!) 141/86 (!) 151/74  Pulse:  (!) 107 92 62  Resp:  18 20 18   Temp: 98.6 F (37 C) 99.8 F (37.7 C) 99.4 F (37.4 C) 98.2 F (36.8 C)  TempSrc:  Oral Oral Oral  SpO2:  95% 94% 98%  Weight:      Height:          Intake/Output from previous day: I/O last 3 completed shifts: In: 1789.9 [P.O.:810; IV Piggyback:979.9] Out: 3200 [Urine:3200]   Intake/Output this shift: No intake/output data recorded.   LABORATORY DATA: Blood cultures from the 12th grew out a nonspecific gram-positive rod in 1 bottle the other bottle remains negative approaching 3 days.  Blood cultures from the 13th remain negative.  Recent Labs    06/20/22 1656 06/21/22 1542 06/22/22 0842 06/23/22 0400  WBC 8.4 12.3* 6.3 7.5  HGB 11.1* 12.0* 10.4* 10.5*  HCT 33.2* 36.4* 31.1* 31.9*  PLT 145* 183 138* 169  NA 134* 136 139 138  K 3.8 4.2 3.6 3.4*  CL 104 105 108 105  CO2 21* 20* 23 22  BUN 20 16 16 22   CREATININE 1.20 1.13 0.95 1.03  GLUCOSE 125* 131* 105* 113*  INR 1.3* 1.1  --   --   CALCIUM 9.0 9.4 8.8* 8.8*    Examination: Dressing has scant drainage is removed the wound is healing there is a little bit of bruising along the edges of the wound he has a 1+ effusion collateral ligaments are stable.  The redness and swelling to his right calf actually diminished compared to 24 hours ago when I saw him.  His area of maximum tenderness is around the plantar aspect of the foot and along the first metatarsal ray.  He does have swelling and warmth to the foot.  Assessment:    1.  The right total knee wound has a relatively benign appearance with a small effusion.  When I did the patient's other knee a few months back he did have a flare of gout and this may be a gout flare going on right now.  Reviewing his MAR it looks like he does not have an order for colchicine which I would like to go ahead and try today to see if it helps him.  Regarding sepsis he had a gram-positive organism in one of his 3 blood culture bottles of a nonspecific species that apparently the lab cannot test.  This may be a false positive.  His urine is clean his lungs have a normal x-ray and CT scan.  The patient is also normally on allopurinol and I will leave it up to the hospitalist whether or not that needs to be restarted.   2. ADDITIONAL DIAGNOSIS: Expected Acute Blood Loss Anemia, history of pulmonary embolism, history of PVCs, history of rheumatoid arthritis, hypertension, history of aortic aneurysm,   Plan: 1.  At this time there is no surgery planned.  The patient may go without dressing at this time as the wound looks relatively normal for  6 days. 2.  We will try colchicine 0.6 mg p.o. twice daily as needed which is how he uses it at home and see if this gives him some pain relief.  He will be reassessed tomorrow if he is better we will continue this course of treatment if not I may consider a single stick aspiration of the knee although again at this time I do not believe there is an active infection in his total knee.    Ryan Glass 06/23/2022, 7:07 AM  Patient ID: Ryan Glass, male   DOB: 11-Nov-1956, 66 y.o.   MRN: [id number]

## 2022-06-24 DIAGNOSIS — I480 Paroxysmal atrial fibrillation: Secondary | ICD-10-CM | POA: Diagnosis not present

## 2022-06-24 DIAGNOSIS — R778 Other specified abnormalities of plasma proteins: Secondary | ICD-10-CM | POA: Diagnosis not present

## 2022-06-24 DIAGNOSIS — R7881 Bacteremia: Secondary | ICD-10-CM | POA: Diagnosis not present

## 2022-06-24 DIAGNOSIS — J9621 Acute and chronic respiratory failure with hypoxia: Secondary | ICD-10-CM | POA: Diagnosis not present

## 2022-06-24 LAB — CBC WITH DIFFERENTIAL/PLATELET
Abs Immature Granulocytes: 0.03 10*3/uL (ref 0.00–0.07)
Basophils Absolute: 0 10*3/uL (ref 0.0–0.1)
Basophils Relative: 0 %
Eosinophils Absolute: 0 10*3/uL (ref 0.0–0.5)
Eosinophils Relative: 0 %
HCT: 30.2 % — ABNORMAL LOW (ref 39.0–52.0)
Hemoglobin: 9.8 g/dL — ABNORMAL LOW (ref 13.0–17.0)
Immature Granulocytes: 1 %
Lymphocytes Relative: 15 %
Lymphs Abs: 1 10*3/uL (ref 0.7–4.0)
MCH: 30.5 pg (ref 26.0–34.0)
MCHC: 32.5 g/dL (ref 30.0–36.0)
MCV: 94.1 fL (ref 80.0–100.0)
Monocytes Absolute: 0.6 10*3/uL (ref 0.1–1.0)
Monocytes Relative: 10 %
Neutro Abs: 4.8 10*3/uL (ref 1.7–7.7)
Neutrophils Relative %: 74 %
Platelets: 178 10*3/uL (ref 150–400)
RBC: 3.21 MIL/uL — ABNORMAL LOW (ref 4.22–5.81)
RDW: 13.5 % (ref 11.5–15.5)
WBC: 6.4 10*3/uL (ref 4.0–10.5)
nRBC: 0 % (ref 0.0–0.2)

## 2022-06-24 LAB — BASIC METABOLIC PANEL
Anion gap: 8 (ref 5–15)
BUN: 21 mg/dL (ref 8–23)
CO2: 22 mmol/L (ref 22–32)
Calcium: 8.4 mg/dL — ABNORMAL LOW (ref 8.9–10.3)
Chloride: 106 mmol/L (ref 98–111)
Creatinine, Ser: 0.96 mg/dL (ref 0.61–1.24)
GFR, Estimated: >60 mL/min (ref >60)
Glucose, Bld: 109 mg/dL — ABNORMAL HIGH (ref 70–99)
Potassium: 3.5 mmol/L (ref 3.5–5.1)
Sodium: 136 mmol/L (ref 135–145)

## 2022-06-24 LAB — C-REACTIVE PROTEIN: CRP: 21.7 mg/dL — ABNORMAL HIGH (ref <1.0)

## 2022-06-24 LAB — MAGNESIUM: Magnesium: 2.2 mg/dL (ref 1.7–2.4)

## 2022-06-24 MED ORDER — GABAPENTIN 300 MG PO CAPS
600.0000 mg | ORAL_CAPSULE | Freq: Three times a day (TID) | ORAL | Status: DC
  Administered 2022-06-24 – 2022-06-28 (×13): 600 mg via ORAL
  Filled 2022-06-24 (×13): qty 2

## 2022-06-24 MED ORDER — BISACODYL 10 MG RE SUPP
10.0000 mg | Freq: Every day | RECTAL | Status: DC | PRN
Start: 2022-06-24 — End: 2022-06-28

## 2022-06-24 MED ORDER — LORATADINE 10 MG PO TABS
10.0000 mg | ORAL_TABLET | Freq: Every day | ORAL | Status: DC
  Administered 2022-06-24 – 2022-06-28 (×5): 10 mg via ORAL
  Filled 2022-06-24 (×5): qty 1

## 2022-06-24 MED ORDER — SENNOSIDES-DOCUSATE SODIUM 8.6-50 MG PO TABS
2.0000 | ORAL_TABLET | Freq: Two times a day (BID) | ORAL | Status: DC
  Administered 2022-06-24 – 2022-06-28 (×7): 2 via ORAL
  Filled 2022-06-24 (×8): qty 2

## 2022-06-24 MED ORDER — FLEET ENEMA 7-19 GM/118ML RE ENEM
1.0000 | ENEMA | Freq: Every day | RECTAL | Status: DC | PRN
  Administered 2022-06-24: 1 via RECTAL
  Filled 2022-06-24: qty 1

## 2022-06-24 MED ORDER — POLYETHYLENE GLYCOL 3350 17 G PO PACK
17.0000 g | PACK | Freq: Two times a day (BID) | ORAL | Status: DC
  Administered 2022-06-26: 17 g via ORAL
  Filled 2022-06-24 (×5): qty 1

## 2022-06-24 MED ORDER — SOTALOL HCL 120 MG PO TABS
120.0000 mg | ORAL_TABLET | Freq: Two times a day (BID) | ORAL | Status: DC
Start: 2022-06-24 — End: 2022-06-28
  Administered 2022-06-24 – 2022-06-28 (×9): 120 mg via ORAL
  Filled 2022-06-24 (×9): qty 1

## 2022-06-24 NOTE — Progress Notes (Signed)
PATIENT ID: Ryan Glass  MRN: [id number]  DOB/AGE:  [birth year] / 66 y.o.        PROGRESS NOTE Subjective: Patient is alert, oriented, no Nausea, no Vomiting, yes passing gas. Taking PO well. Denies SOB, Chest or Calf Pain. Using Incentive Spirometer, PAS in place. Ambulate WBAT, Patient reports pain as 5/10. Has not had BM in 6 days .    Objective: Vital signs in last 24 hours: Vitals:   06/23/22 2100 06/23/22 2122 06/24/22 0414 06/24/22 0500  BP: (!) 146/74  135/88   Pulse: 84  91   Resp:   18   Temp: (!) 102.6 F (39.2 C) 99.9 F (37.7 C) 98.9 F (37.2 C)   TempSrc: Oral  Oral   SpO2:   96%   Weight:    107 kg  Height:          Intake/Output from previous day: I/O last 3 completed shifts: In: 1449.2 [P.O.:930; IV Piggyback:519.2] Out: 3400 [Urine:3400]   Intake/Output this shift: Total I/O In: 1037.5 [P.O.:150; IV Piggyback:887.5] Out: 900 [Urine:900]   LABORATORY DATA: Recent Labs    06/21/22 1542 06/22/22 0842 06/23/22 0400 06/24/22 0341  WBC 12.3*   < > 7.5 6.4  HGB 12.0*   < > 10.5* 9.8*  HCT 36.4*   < > 31.9* 30.2*  PLT 183   < > 169 178  NA 136   < > 138 136  K 4.2   < > 3.4* 3.5  CL 105   < > 105 106  CO2 20*   < > 22 22  BUN 16   < > 22 21  CREATININE 1.13   < > 1.03 0.96  GLUCOSE 131*   < > 113* 109*  INR 1.1  --   --   --   CALCIUM 9.4   < > 8.8* 8.4*   < > = values in this interval not displayed.    Examination: Right total knee wound is sealed, there is some peripheral bruising.  Overall the swelling to the right total knee and right calf is diminished his calf is soft and nontender.  His right foot still has 2+ swelling tender along the first metatarsal ray and the midfoot.  Consistent with gout.  Assessment:       1. Swelling and discomfort to his right total knee continues to gradually improve.  His calf is definitely improved.  He still has swelling and pain in his right foot.  Yesterday he got 3 doses of colchicine and allopurinol has  been restarted.  Of his 3 blood cultures, 1 grew out gram-positive bacillus that was not amenable to standardized testing, this may be a false positive. 2.  Postoperative constipation   Plan: Physical therapy consult to begin mobilization weightbearing as tolerated with gentle active and passive range of motion.  This should help with his constipation also.  As needed orders for bisacodyl and fleets enema.     Ryan Glass 06/24/2022, 6:25 AM  Patient ID: Ryan Glass, male   DOB: September 29, 1956, 66 y.o.   MRN: [id number]

## 2022-06-24 NOTE — Plan of Care (Signed)
  Problem: Education: Goal: Knowledge of General Education information will improve Description: Including pain rating scale, medication(s)/side effects and non-pharmacologic comfort measures Outcome: Progressing   Problem: Health Behavior/Discharge Planning: Goal: Ability to manage health-related needs will improve Outcome: Progressing   Problem: Clinical Measurements: Goal: Ability to maintain clinical measurements within normal limits will improve Outcome: Progressing   Problem: Nutrition: Goal: Adequate nutrition will be maintained Outcome: Progressing   Problem: Elimination: Goal: Will not experience complications related to bowel motility Outcome: Progressing   

## 2022-06-24 NOTE — Progress Notes (Signed)
PROGRESS NOTE    Ryan Glass  [id number] DOB: [birth year] DOA: 06/21/2022 PCP: [name], MD   Brief Narrative:  66 y.o. male with medical history significant of HTN, dilated aortic root, PE, Hypertropic cardiomyopathy s/p ICD, CAD, HFrEF,  RA, recent right total knee arthroplasty on 06/18/2022 was admitted for positive blood cultures.  He had presented to the ED on 06/20/2022 with fever and altered mental status and was discharged home after negative work-up but later on was called for admission for positive blood cultures with gram-positive rods growing in blood cultures.  On presentation, he was afebrile, required supplemental oxygen, had leukocytosis with WBCs of 12.3.  COVID-19 and influenza were negative.  UA was negative.  CTA chest was negative for pulmonary embolism.  Bilateral lower extremity ultrasound was negative for DVT.  He was started on broad-spectrum antibiotics.  Orthopedics was consulted.  Assessment & Plan:   Bacteremia: Gram-positive rod bacteremia Sepsis: Present on admission History of recent right total knee arthroplasty -Presented with lactic acidosis, leukocytosis, bacteremia with concern for right knee infection -Currently on IV vancomycin, Flagyl and cefepime.  Blood cultures from 06/20/2022 grew Bacillus species.  Possibly contaminant. Repeat blood cultures from 06/21/2022 are negative so far. -Orthopedics following.  Currently no plans for any surgical intervention.  Spoke to Dr. Gregery Na on phone today and decided that patient does not need any more antibiotics.  He does not think that the right knee is infected.  Will DC antibiotics today and monitor. -Bilateral lower extremity duplex ultrasound was negative for DVT. -Sepsis has resolved -PT eval  Right knee pain -?  Acute gout flare as well.  Continue colchicine and allopurinol.  Leukocytosis -Resolved  Paroxysmal A-fib -Remains off Eliquis postoperatively.  Continue to hold Eliquis for  now.  Will resume once cleared by orthopedics.  Hypertrophic cardiomyopathy status post ICD -Echo has been ordered in the setting of bacteremia.  Outpatient follow-up with cardiology  Elevated troponin -Troponins 103 and 91.  No chest pain.  Possibly demand ischemia.  No further work-up needed.  Echo showed EF of 65 to 70% with grade 1 diastolic dysfunction  Hypokalemia -Improved.  Essential hypertension -Blood pressure stable.  Continue hydralazine and spironolactone.  Monitor blood pressure.  Resume sotalol  Acute on chronic respiratory failure with hypoxia -Possible from bacteremia.  CTA chest negative for PE or infiltrates.  Currently on room air.  Incentive spirometry.  Anemia of chronic disease -From chronic illnesses.  Hemoglobin stable.  Monitor intermittently  Obesity -Outpatient follow-up  Constipation -Continue bowel regimen.  Enema has been ordered by orthopedics team.  DVT prophylaxis: Eliquis on hold Code Status: Full Family Communication: None at bedside Disposition Plan: Status is: Inpatient Remains inpatient appropriate because: Of severity of illness.  Possible discharge in 1 to 2 days if improves clinically.    Consultants: Orthopedics  Procedures: None  Antimicrobials: Cefepime and vancomycin from 06/21/2022 onwards   Subjective: Patient seen and examined at bedside.  Feels slightly better but still complains of knee pain.  Has not been able to have a bowel movement yet.  Had fever last night.  No chest pain, palpitations reported. Objective: Vitals:   06/23/22 2100 06/23/22 2122 06/24/22 0414 06/24/22 0500  BP: (!) 146/74  135/88   Pulse: 84  91   Resp:   18   Temp: (!) 102.6 F (39.2 C) 99.9 F (37.7 C) 98.9 F (37.2 C)   TempSrc: Oral  Oral   SpO2:   96%   Weight:  107 kg  Height:        Intake/Output Summary (Last 24 hours) at 06/24/2022 0756 Last data filed at 06/24/2022 0600 Gross per 24 hour  Intake 1157.48 ml  Output 1100 ml   Net 57.48 ml    Filed Weights   06/22/22 0840 06/24/22 0500  Weight: 104.7 kg 107 kg    Examination:  General: No acute distress.  Currently on room air.  Chronically ill and deconditioned. ENT/neck: No JVD elevation or thyromegaly noted respiratory: Bilateral decreased breath sounds at bases with some crackles  CVS: Mostly rate controlled; S1 and S2 heard  Abdominal: Soft, obese, slightly distended,.  Bowel sounds are heard normally extremities: Right lower extremity edema still present; no cyanosis.  Right knee swelling slight erythema around incision site  CNS: Alert and oriented.  Answers questions appropriately.  No focal neurologic deficit.  Moving extremities Lymph: No palpable obvious lymphadenopathy noted  skin: No obvious petechiae/rashes  psych: Normal mood, affect  musculoskeletal: No obvious other joint swelling/deformity    Data Reviewed: I have personally reviewed following labs and imaging studies  CBC: Recent Labs  Lab 06/20/22 1656 06/21/22 1542 06/22/22 0842 06/23/22 0400 06/24/22 0341  WBC 8.4 12.3* 6.3 7.5 6.4  NEUTROABS 6.6 10.0* 5.1 5.9 4.8  HGB 11.1* 12.0* 10.4* 10.5* 9.8*  HCT 33.2* 36.4* 31.1* 31.9* 30.2*  MCV 94.1 94.1 94.2 93.5 94.1  PLT 145* 183 138* 169 0000000    Basic Metabolic Panel: Recent Labs  Lab 06/20/22 1656 06/21/22 1542 06/22/22 0842 06/23/22 0400 06/24/22 0341  NA 134* 136 139 138 136  K 3.8 4.2 3.6 3.4* 3.5  CL 104 105 108 105 106  CO2 21* 20* 23 22 22   GLUCOSE 125* 131* 105* 113* 109*  BUN 20 16 16 22 21   CREATININE 1.20 1.13 0.95 1.03 0.96  CALCIUM 9.0 9.4 8.8* 8.8* 8.4*  MG  --   --  2.0 2.1 2.2    GFR: Estimated Creatinine Clearance: 89.7 mL/min (by C-G formula based on SCr of 0.96 mg/dL). Liver Function Tests: Recent Labs  Lab 06/20/22 1656 06/21/22 1542 06/23/22 0400  AST 24 30 24   ALT 18 19 22   ALKPHOS 64 76 68  BILITOT 1.1 1.3* 1.0  PROT 6.2* 7.4 6.0*  ALBUMIN 3.6 4.1 3.0*    No results for  input(s): "LIPASE", "AMYLASE" in the last 168 hours. No results for input(s): "AMMONIA" in the last 168 hours. Coagulation Profile: Recent Labs  Lab 06/20/22 1656 06/21/22 1542  INR 1.3* 1.1    Cardiac Enzymes: No results for input(s): "CKTOTAL", "CKMB", "CKMBINDEX", "TROPONINI" in the last 168 hours. BNP (last 3 results) Recent Labs    09/19/21 0938 12/05/21 1637  PROBNP 355 183    HbA1C: No results for input(s): "HGBA1C" in the last 72 hours. CBG: No results for input(s): "GLUCAP" in the last 168 hours. Lipid Profile: No results for input(s): "CHOL", "HDL", "LDLCALC", "TRIG", "CHOLHDL", "LDLDIRECT" in the last 72 hours. Thyroid Function Tests: No results for input(s): "TSH", "T4TOTAL", "FREET4", "T3FREE", "THYROIDAB" in the last 72 hours. Anemia Panel: No results for input(s): "VITAMINB12", "FOLATE", "FERRITIN", "TIBC", "IRON", "RETICCTPCT" in the last 72 hours. Sepsis Labs: Recent Labs  Lab 06/20/22 1656 06/21/22 1542 06/21/22 1748  LATICACIDVEN 1.5 2.4* 1.6     Recent Results (from the past 240 hour(s))  Blood Culture (routine x 2)     Status: Abnormal   Collection Time: 06/20/22  4:15 PM   Specimen: BLOOD  Result Value Ref  Range Status   Specimen Description   Final    BLOOD SITE NOT SPECIFIED Performed at [facility] [address], [place], [place] [address]    Special Requests   Final    BOTTLES DRAWN AEROBIC AND ANAEROBIC Blood Culture adequate volume Performed at [facility] [address] [place], [place] [address]    Culture  Setup Time   Final    GRAM POSITIVE RODS AEROBIC BOTTLE ONLY CRITICAL RESULT CALLED TO, READ BACK BY AND VERIFIED WITH: RN KELLY NEAL 06/21/22@5 :31 BY TW                                                                                                                                                                               Culture (A)  Final    BACILLUS SPECIES Standardized  susceptibility testing for this organism is not available. Performed at [facility], [place] [address], [place], [place] [address]    Report Status 06/22/2022 FINAL  Final  Blood Culture (routine x 2)     Status: None (Preliminary result)   Collection Time: 06/20/22  4:24 PM   Specimen: BLOOD  Result Value Ref Range Status   Specimen Description   Final    BLOOD SITE NOT SPECIFIED Performed at [facility] [address], [place], [place] [address]    Special Requests   Final    BOTTLES DRAWN AEROBIC AND ANAEROBIC Blood Culture adequate volume Performed at [facility] [address] [place], [place] [address]    Culture   Final    NO GROWTH 3 DAYS Performed at [facility], [place] [address], [place], [place] [address]    Report Status PENDING  Incomplete  Urine Culture     Status: None   Collection Time: 06/20/22  5:34 PM   Specimen: In/Out Cath Urine  Result Value Ref Range Status   Specimen Description   Final    IN/OUT CATH URINE Performed at [facility] [address], [place], [place] [address]    Special Requests   Final    NONE Performed at [facility], [place] [address], [place], [place] [address]    Culture   Final    NO GROWTH Performed at [facility], [place] [address] [place], [place] [address]    Report Status 06/21/2022 FINAL  Final  Blood Culture (routine x 2)     Status: None (Preliminary result)   Collection Time: 06/21/22  4:10 PM   Specimen: BLOOD  Result Value Ref Range Status   Specimen Description   Final    BLOOD SITE NOT SPECIFIED Performed  at [facility], [address], [place], [place] [address]    Special Requests   Final    BOTTLES DRAWN AEROBIC AND ANAEROBIC Blood Culture adequate volume Performed at [facility], [address], [place], [place] [address]    Culture   Final    NO GROWTH 2 DAYS Performed  at [facility], [address], [place], [place] [address]    Report Status PENDING  Incomplete  Resp Panel by RT-PCR (Flu A&B, Covid) Anterior Nasal Swab     Status: None   Collection Time: 06/21/22  5:51 PM   Specimen: Anterior Nasal Swab  Result Value Ref Range Status   SARS Coronavirus 2 by RT PCR NEGATIVE NEGATIVE Final    Comment: (NOTE) SARS-CoV-2 target nucleic acids are NOT DETECTED.  The SARS-CoV-2 RNA is generally detectable in upper respiratory specimens during the acute phase of infection. The lowest concentration of SARS-CoV-2 viral copies this assay can detect is 138 copies/mL. A negative result does not preclude SARS-Cov-2 infection and should not be used as the sole basis for treatment or other patient management decisions. A negative result may occur with  improper specimen collection/handling, submission of specimen other than nasopharyngeal swab, presence of viral mutation(s) within the areas targeted by this assay, and inadequate number of viral copies(<138 copies/mL). A negative result must be combined with clinical observations, patient history, and epidemiological information. The expected result is Negative.  Fact Sheet for Patients:  BloggerCourse.com  Fact Sheet for Healthcare Providers:  SeriousBroker.it  This test is no t yet approved or cleared by the [place] FDA and  has been authorized for detection and/or diagnosis of SARS-CoV-2 by FDA under an Emergency Use Authorization (EUA). This EUA will remain  in effect (meaning this test can be used) for the duration of the COVID-19 declaration under Section 564(b)(1) of the Act, 21 U.S.C.section 360bbb-3(b)(1), unless the authorization is terminated  or revoked sooner.       Influenza A by PCR NEGATIVE NEGATIVE Final   Influenza B by PCR NEGATIVE NEGATIVE Final    Comment: (NOTE) The Xpert Xpress SARS-CoV-2/FLU/RSV plus assay is intended  as an aid in the diagnosis of influenza from Nasopharyngeal swab specimens and should not be used as a sole basis for treatment. Nasal washings and aspirates are unacceptable for Xpert Xpress SARS-CoV-2/FLU/RSV testing.  Fact Sheet for Patients: BloggerCourse.com  Fact Sheet for Healthcare Providers: SeriousBroker.it  This test is not yet approved or cleared by the [place] FDA and has been authorized for detection and/or diagnosis of SARS-CoV-2 by FDA under an Emergency Use Authorization (EUA). This EUA will remain in effect (meaning this test can be used) for the duration of the COVID-19 declaration under Section 564(b)(1) of the Act, 21 U.S.C. section 360bbb-3(b)(1), unless the authorization is terminated or revoked.  Performed at [facility], [address] [place], [place] [address]   Urine Culture     Status: None   Collection Time: 06/21/22  6:24 PM   Specimen: In/Out Cath Urine  Result Value Ref Range Status   Specimen Description   Final    IN/OUT CATH URINE Performed at [facility], [address] [place], [place] [address]    Special Requests   Final    NONE Performed at [facility], [address] [place], [place] [address]    Culture   Final    NO GROWTH Performed at [facility], [address], [place], [place] [address]    Report Status 06/22/2022 FINAL  Final         Radiology Studies: ECHOCARDIOGRAM COMPLETE  Result Date: 06/23/2022    ECHOCARDIOGRAM REPORT   Patient Name:   ASAHD LEISINGER Date of Exam: 06/23/2022 Medical Rec #:  [id number]      Height:       68.0 in Accession #:    [id number]     Weight:       230.8 lb Date of Birth:  08-05-1956       BSA:          2.172 m Patient Age:    13 years       BP:           151/74 mmHg Patient Gender: M              HR:           95 bpm. Exam Location:  Inpatient Procedure: 2D Echo,  Cardiac Doppler and Color Doppler Indications:    Bacteremia R78.81  History:        Patient has prior history of Echocardiogram examinations, most                 recent 08/03/2021. CAD; Signs/Symptoms:Dyspnea.  Sonographer:    Luane School RDCS Referring Phys: [id number] Gregory  1. There is a highly mobile density that appears attached to the RV device lead concerning for possible vegetation vs thrombus (clips 2, 12). Recommend TEE for further evaluation.  2. Left ventricular ejection fraction, by estimation, is 65 to 70%. The left ventricle has normal function. The left ventricle has no regional wall motion abnormalities. There is moderate concentric left ventricular hypertrophy. Left ventricular diastolic parameters are consistent with Grade I diastolic dysfunction (impaired relaxation).  3. Right ventricular systolic function is normal. The right ventricular size is mildly enlarged. There is normal pulmonary artery systolic pressure. The estimated right ventricular systolic pressure is Q000111Q mmHg.  4. Left atrial size was mildly dilated.  5. A small pericardial effusion is present. The pericardial effusion is posterior and lateral to the left ventricle.  6. The mitral valve is grossly normal. Trivial mitral valve regurgitation.  7. The aortic valve is tricuspid. There is mild calcification of the aortic valve. There is mild thickening of the aortic valve. Aortic valve regurgitation is trivial. Aortic valve sclerosis/calcification is present, without any evidence of aortic stenosis.  8. Aortic dilatation noted. There is moderate dilatation of the aortic root, measuring 47 mm. There is moderate dilatation of the ascending aorta, measuring 47 mm.  9. The inferior vena cava is normal in size with greater than 50% respiratory variability, suggesting right atrial pressure of 3 mmHg. Comparison(s): Compared to prior TTE on 08/03/21, there is now a mobile density visualized on the device lead as detailed  above. FINDINGS  Left Ventricle: Left ventricular ejection fraction, by estimation, is 65 to 70%. The left ventricle has normal function. The left ventricle has no regional wall motion abnormalities. The left ventricular internal cavity size was normal in size. There is  moderate concentric left ventricular hypertrophy. Left ventricular diastolic parameters are consistent with Grade I diastolic dysfunction (impaired relaxation). Right Ventricle: There is a highly mobile density that appears attached to the RV device lead concerning for possible vegetation vs thrombus. Recommend TEE for further evaluation. The right ventricular size is mildly enlarged. No increase in right ventricular wall thickness. Right ventricular systolic  function is normal. There is normal pulmonary artery systolic pressure. The tricuspid regurgitant velocity is 1.79 m/s, and with an assumed right atrial pressure of 3 mmHg, the estimated right ventricular systolic pressure is Q000111Q mmHg. Left Atrium: Left atrial size was mildly dilated. Right Atrium: Right atrial size was not well visualized. Pericardium: A small pericardial effusion is present. The pericardial effusion is posterior and lateral to the left ventricle. Mitral Valve: The mitral valve is grossly normal. There is mild thickening of the mitral valve leaflet(s). There is mild calcification of the mitral valve leaflet(s). Mild mitral annular calcification. Trivial mitral valve regurgitation. Tricuspid Valve: The tricuspid valve is normal in structure. Tricuspid valve regurgitation is trivial. Aortic Valve: The aortic valve is tricuspid. There is mild calcification of the aortic valve. There is mild thickening of the aortic valve. Aortic valve regurgitation is trivial. Aortic valve sclerosis/calcification is present, without any evidence of aortic stenosis. Pulmonic Valve: The pulmonic valve was not well visualized. Pulmonic valve regurgitation is trivial. Aorta: Aortic dilatation noted.  There is moderate dilatation of the aortic root, measuring 47 mm. There is moderate dilatation of the ascending aorta, measuring 47 mm. Venous: The inferior vena cava is normal in size with greater than 50% respiratory variability, suggesting right atrial pressure of 3 mmHg. IAS/Shunts: The interatrial septum was not well visualized. Additional Comments: A device lead is visualized.  LEFT VENTRICLE PLAX 2D LVIDd:         4.70 cm LVIDs:         2.60 cm LV PW:         1.60 cm LV IVS:        1.60 cm LVOT diam:     2.60 cm LV SV:         74 LV SV Index:   34 LVOT Area:     5.31 cm  RIGHT VENTRICLE             IVC RV S prime:     22.90 cm/s  IVC diam: 2.20 cm TAPSE (M-mode): 3.4 cm LEFT ATRIUM           Index        RIGHT ATRIUM           Index LA diam:      3.90 cm 1.80 cm/m   RA Area:     27.70 cm LA Vol (A4C): 63.3 ml 29.14 ml/m  RA Volume:   80.60 ml  37.11 ml/m  AORTIC VALVE LVOT Vmax:   91.30 cm/s LVOT Vmean:  59.100 cm/s LVOT VTI:    0.140 m  AORTA Ao Root diam: 4.70 cm Ao Asc diam:  4.70 cm Ao Desc diam: 2.40 cm MITRAL VALVE               TRICUSPID VALVE MV Area (PHT): 4.36 cm    TR Peak grad:   12.8 mmHg MV Decel Time: 174 msec    TR Vmax:        179.00 cm/s MV E velocity: 53.60 cm/s MV A velocity: 86.40 cm/s  SHUNTS MV E/A ratio:  0.62        Systemic VTI:  0.14 m                            Systemic Diam: 2.60 cm Gwyndolyn Kaufman MD Electronically signed by Gwyndolyn Kaufman MD Signature Date/Time: 06/23/2022/1:03:37 PM    Final  Scheduled Meds:  allopurinol  100 mg Oral Daily   apixaban  2.5 mg Oral BID   hydrALAZINE  100 mg Oral TID   montelukast  10 mg Oral QHS   polyethylene glycol  17 g Oral Daily   senna-docusate  1 tablet Oral QHS   spironolactone  25 mg Oral Daily   Continuous Infusions:  ceFEPime (MAXIPIME) IV 2 g (06/24/22 0020)   metronidazole 500 mg (06/23/22 2119)   vancomycin 1,500 mg (06/23/22 1650)          Glade Lloyd, MD [facility] 06/24/2022,  7:56 AM

## 2022-06-24 NOTE — Evaluation (Signed)
Physical Therapy Evaluation Patient Details Name: Ryan Glass MRN: [id number] DOB: 1956/08/07 Today's Date: 06/24/2022  History of Present Illness  66 yo male admitted with bacteremia, gout flare, sepsis. Recent hx of R TKA 06/18/22. Hx of L TKA, AICD, Afib, CHF, gout, CKD, CAD, OSA, hernia repair, RA  Clinical Impression  On eval, pt required Mod A +2 safety/equipment. Pt walked ~10 feet around the room with a RW. Moderate pain with activity. Will plan to follow and progress activity as tolerated. Discussed d/c plan-pt is hopeful to d/c home-this will depend on length of stay and continued progress.  May need to consider ST SNF.   *Recommend OOB daily with nursing in addition to PT sessions*      Recommendations for follow up therapy are one component of a multi-disciplinary discharge planning process, led by the attending physician.  Recommendations may be updated based on patient status, additional functional criteria and insurance authorization.  Follow Up Recommendations Home health PT vs SNF-depending on progress      Assistance Recommended at Discharge Frequent or constant Supervision/Assistance  Patient can return home with the following  A little help with walking and/or transfers;A little help with bathing/dressing/bathroom;Assistance with cooking/housework;Assist for transportation;Help with stairs or ramp for entrance    Equipment Recommendations None recommended by PT  Recommendations for Other Services  OT consult    Functional Status Assessment Patient has had a recent decline in their functional status and demonstrates the ability to make significant improvements in function in a reasonable and predictable amount of time.     Precautions / Restrictions Precautions Precautions: Fall Restrictions Weight Bearing Restrictions: No RLE Weight Bearing: Weight bearing as tolerated      Mobility  Bed Mobility Overal bed mobility: Needs Assistance Bed Mobility: Supine  to Sit, Sit to Supine     Supine to sit: Min assist, HOB elevated Sit to supine: Mod assist, HOB elevated   General bed mobility comments: Min A for R LE off bed. Mod A for LEs onto bed.  Increased time. Pt used bedrail as needed. Cues provided.    Transfers Overall transfer level: Needs assistance Equipment used: Rolling walker (2 wheels) Transfers: Sit to/from Stand Sit to Stand: Min assist, From elevated surface           General transfer comment: Highly elevated bed. Cues for safety, technique, hand placement. Increased time.    Ambulation/Gait Ambulation/Gait assistance: Min assist Gait Distance (Feet): 10 Feet Assistive device: Rolling walker (2 wheels) Gait Pattern/deviations: Step-to pattern, Antalgic, Decreased dorsiflexion - right, Decreased weight shift to right       General Gait Details: Min A to steady. Cues provided. Slow, cautious gait. Ambulation distance limited by pain on today.  Stairs            Wheelchair Mobility    Modified Rankin (Stroke Patients Only)       Balance Overall balance assessment: Needs assistance         Standing balance support: Reliant on assistive device for balance, During functional activity, Bilateral upper extremity supported Standing balance-Leahy Scale: Poor                               Pertinent Vitals/Pain Pain Assessment Pain Assessment: 0-10 Pain Score: 7  Pain Location: R knee Pain Descriptors / Indicators: Aching, Discomfort, Guarding, Grimacing Pain Intervention(s): Limited activity within patient's tolerance, Monitored during session, Repositioned, Ice applied    Home  Living Family/patient expects to be discharged to:: Private residence Living Arrangements: Spouse/significant other Available Help at Discharge: Family;Available 24 hours/day Type of Home: House Home Access: Stairs to enter Entrance Stairs-Rails: Left;Can reach Therapist, art of Steps:  5-6 Alternate Level Stairs-Number of Steps: Flight Home Layout: Multi-level Home Equipment: Grab bars - tub/shower;Cane - single Librarian, academic (2 wheels) Additional Comments: one railing to the third floor that has been pulled out previously but has since been repaired but may not be secure    Prior Function Prior Level of Function : Independent/Modified Independent             Mobility Comments: SPC while in pain, RW if out in the community ADLs Comments: IND     Hand Dominance   Dominant Hand: Right    Extremity/Trunk Assessment   Upper Extremity Assessment Upper Extremity Assessment: Defer to OT evaluation    Lower Extremity Assessment Lower Extremity Assessment: Generalized weakness LLE Deficits / Details: edematous, painful, red. unable to SLR on today. moves ankle well    Cervical / Trunk Assessment Cervical / Trunk Assessment: Normal  Communication   Communication: No difficulties  Cognition Arousal/Alertness: Awake/alert Behavior During Therapy: WFL for tasks assessed/performed Overall Cognitive Status: Within Functional Limits for tasks assessed                                          General Comments      Exercises Total Joint Exercises Ankle Circles/Pumps: AROM, Both, 5 reps   Assessment/Plan    PT Assessment Patient needs continued PT services  PT Problem List Decreased strength;Decreased range of motion;Decreased activity tolerance;Decreased balance;Decreased mobility;Decreased coordination;Pain       PT Treatment Interventions DME instruction;Gait training;Functional mobility training;Therapeutic activities;Balance training;Patient/family education;Therapeutic exercise;Stair training    PT Goals (Current goals can be found in the Care Plan section)  Acute Rehab PT Goals Patient Stated Goal: less pain PT Goal Formulation: With patient Time For Goal Achievement: 07/08/22 Potential to Achieve Goals: Good     Frequency Min 5X/week     Co-evaluation               AM-PAC PT "6 Clicks" Mobility  Outcome Measure Help needed turning from your back to your side while in a flat bed without using bedrails?: A Little Help needed moving from lying on your back to sitting on the side of a flat bed without using bedrails?: A Lot Help needed moving to and from a bed to a chair (including a wheelchair)?: A Lot Help needed standing up from a chair using your arms (e.g., wheelchair or bedside chair)?: A Lot Help needed to walk in hospital room?: A Little Help needed climbing 3-5 steps with a railing? : Total 6 Click Score: 13    End of Session Equipment Utilized During Treatment: Gait belt Activity Tolerance: Patient limited by pain Patient left: in bed;with call bell/phone within reach;with bed alarm set   PT Visit Diagnosis: Pain;Difficulty in walking, not elsewhere classified (R26.2) Pain - Right/Left: Right Pain - part of body: Knee    Time: 1410-1435 PT Time Calculation (min) (ACUTE ONLY): 25 min   Charges:   PT Evaluation $PT Eval Moderate Complexity: 1 Mod PT Treatments $Gait Training: 8-22 mins           Faye Ramsay, PT Acute Rehabilitation  Office: [phone] Pager: [phone]

## 2022-06-25 DIAGNOSIS — R778 Other specified abnormalities of plasma proteins: Secondary | ICD-10-CM | POA: Diagnosis not present

## 2022-06-25 DIAGNOSIS — R7881 Bacteremia: Secondary | ICD-10-CM | POA: Diagnosis not present

## 2022-06-25 DIAGNOSIS — J9621 Acute and chronic respiratory failure with hypoxia: Secondary | ICD-10-CM | POA: Diagnosis not present

## 2022-06-25 DIAGNOSIS — I480 Paroxysmal atrial fibrillation: Secondary | ICD-10-CM | POA: Diagnosis not present

## 2022-06-25 LAB — CULTURE, BLOOD (ROUTINE X 2)
Culture: NO GROWTH
Special Requests: ADEQUATE

## 2022-06-25 LAB — CBC WITH DIFFERENTIAL/PLATELET
Abs Immature Granulocytes: 0.03 10*3/uL (ref 0.00–0.07)
Basophils Absolute: 0 10*3/uL (ref 0.0–0.1)
Basophils Relative: 0 %
Eosinophils Absolute: 0 10*3/uL (ref 0.0–0.5)
Eosinophils Relative: 1 %
HCT: 31 % — ABNORMAL LOW (ref 39.0–52.0)
Hemoglobin: 9.9 g/dL — ABNORMAL LOW (ref 13.0–17.0)
Immature Granulocytes: 1 %
Lymphocytes Relative: 16 %
Lymphs Abs: 1 10*3/uL (ref 0.7–4.0)
MCH: 30.2 pg (ref 26.0–34.0)
MCHC: 31.9 g/dL (ref 30.0–36.0)
MCV: 94.5 fL (ref 80.0–100.0)
Monocytes Absolute: 0.5 10*3/uL (ref 0.1–1.0)
Monocytes Relative: 8 %
Neutro Abs: 4.8 10*3/uL (ref 1.7–7.7)
Neutrophils Relative %: 74 %
Platelets: 208 10*3/uL (ref 150–400)
RBC: 3.28 MIL/uL — ABNORMAL LOW (ref 4.22–5.81)
RDW: 13.6 % (ref 11.5–15.5)
WBC: 6.4 10*3/uL (ref 4.0–10.5)
nRBC: 0 % (ref 0.0–0.2)

## 2022-06-25 LAB — BASIC METABOLIC PANEL
Anion gap: 8 (ref 5–15)
BUN: 24 mg/dL — ABNORMAL HIGH (ref 8–23)
CO2: 23 mmol/L (ref 22–32)
Calcium: 8.6 mg/dL — ABNORMAL LOW (ref 8.9–10.3)
Chloride: 105 mmol/L (ref 98–111)
Creatinine, Ser: 0.98 mg/dL (ref 0.61–1.24)
GFR, Estimated: >60 mL/min (ref >60)
Glucose, Bld: 121 mg/dL — ABNORMAL HIGH (ref 70–99)
Potassium: 3.5 mmol/L (ref 3.5–5.1)
Sodium: 136 mmol/L (ref 135–145)

## 2022-06-25 LAB — MAGNESIUM: Magnesium: 2.2 mg/dL (ref 1.7–2.4)

## 2022-06-25 LAB — C-REACTIVE PROTEIN: CRP: 18.6 mg/dL — ABNORMAL HIGH (ref <1.0)

## 2022-06-25 MED ORDER — METHYLPREDNISOLONE SODIUM SUCC 125 MG IJ SOLR
60.0000 mg | Freq: Once | INTRAMUSCULAR | Status: AC
Start: 2022-06-25 — End: 2022-06-25
  Administered 2022-06-25: 60 mg via INTRAVENOUS
  Filled 2022-06-25: qty 2

## 2022-06-25 MED ORDER — METHYLPREDNISOLONE SODIUM SUCC 125 MG IJ SOLR
INTRAMUSCULAR | Status: AC
  Filled 2022-06-25: qty 2

## 2022-06-25 MED ORDER — ACETAMINOPHEN 325 MG PO TABS
650.0000 mg | ORAL_TABLET | Freq: Four times a day (QID) | ORAL | Status: AC | PRN
  Administered 2022-06-25 – 2022-06-26 (×2): 650 mg via ORAL
  Filled 2022-06-25 (×2): qty 2

## 2022-06-25 NOTE — Progress Notes (Signed)
PROGRESS NOTE    Ryan Glass  [id number] DOB: 03-10-1956 DOA: 06/21/2022 PCP: [name], MD   Brief Narrative:  66 y.o. male with medical history significant of HTN, dilated aortic root, PE, Hypertropic cardiomyopathy s/p ICD, CAD, HFrEF,  RA, recent right total knee arthroplasty on 06/18/2022 was admitted for positive blood cultures.  He had presented to the ED on 06/20/2022 with fever and altered mental status and was discharged home after negative work-up but later on was called for admission for positive blood cultures with gram-positive rods growing in blood cultures.  On presentation, he was afebrile, required supplemental oxygen, had leukocytosis with WBCs of 12.3.  COVID-19 and influenza were negative.  UA was negative.  CTA chest was negative for pulmonary embolism.  Bilateral lower extremity ultrasound was negative for DVT.  He was started on broad-spectrum antibiotics.  Orthopedics was consulted.  Assessment & Plan:   Bacteremia: Gram-positive rod bacteremia Sepsis: Present on admission History of recent right total knee arthroplasty -Presented with lactic acidosis, leukocytosis, bacteremia with concern for right knee infection -Currently on IV vancomycin, Flagyl and cefepime.  Blood cultures from 06/20/2022 grew Bacillus species.  Possibly contaminant. Repeat blood cultures from 06/21/2022 are negative so far. -Orthopedics following.  Broad-spectrum antibiotics were discontinued on 06/24/2022 after discussion with orthopedics. -Bilateral lower extremity duplex ultrasound was negative for DVT. -Sepsis has resolved -PT eval  Right knee pain -?  Acute gout flare.  Continue colchicine and allopurinol.  Still complains of knee pain but improving.  Discussed with the patient and he is agreeable for IV steroid today.  We will give Solu-Medrol 60 mg 1 dose today.  Leukocytosis -Resolved  Paroxysmal A-fib -Remains on lower dose of Eliquis postoperatively.  Will resume regular  dose once cleared by orthopedics.  Hypertrophic cardiomyopathy status post ICD -Echo showed EF of 65 to 70% with grade 1 diastolic dysfunction.  Outpatient follow-up with cardiology.  Elevated troponin -Troponins 103 and 91.  No chest pain.  Possibly demand ischemia.  No further work-up needed.  Echo showed EF of 65 to 70% with grade 1 diastolic dysfunction  Hypokalemia -Improved.  Essential hypertension -Blood pressure stable.  Continue hydralazine, sotalol and spironolactone.  Monitor blood pressure.  Acute on chronic respiratory failure with hypoxia -Possible from bacteremia.  CTA chest negative for PE or infiltrates.  Currently on room air.  Incentive spirometry.  Anemia of chronic disease -From chronic illnesses.  Hemoglobin stable.  Monitor intermittently  Obesity -Outpatient follow-up  Constipation -Continue bowel regimen.     DVT prophylaxis: Eliquis Code Status: Full Family Communication: None at bedside Disposition Plan: Status is: Inpatient Remains inpatient appropriate because: Of severity of illness.  Possible discharge in 1 to 2 days if improves clinically.    Consultants: Orthopedics  Procedures: None  Antimicrobials: Cefepime and vancomycin from 06/21/2022-06/23/2022  Subjective: Patient seen and examined at bedside.  Still having some knee pain but feels slightly better; able to move toes better.  Okay with trying intravenous steroid.  No chest pain, vomiting, abdominal pain reported. Objective: Vitals:   06/24/22 1407 06/24/22 1856 06/24/22 2133 06/25/22 0417  BP: 131/85  117/71 122/83  Pulse: 89  90 90  Resp: 20  16   Temp: 100 F (37.8 C) 99.1 F (37.3 C) 99.3 F (37.4 C) 99 F (37.2 C)  TempSrc: Oral Oral Oral Oral  SpO2: 96%  95% 97%  Weight:    107.7 kg  Height:        Intake/Output Summary (  Last 24 hours) at 06/25/2022 1106 Last data filed at 06/25/2022 0944 Gross per 24 hour  Intake 360 ml  Output 850 ml  Net -490 ml    Filed  Weights   06/22/22 0840 06/24/22 0500 06/25/22 0417  Weight: 104.7 kg 107 kg 107.7 kg    Examination:  General: On room air.  No distress.  Chronically ill and deconditioned. ENT/neck: No obvious JVD elevation noted.  No palpable neck masses noted.  Respiratory: Decreased breath sounds at bases bilaterally, no wheezing  CVS: S1 and S2 heard; rate controlled currently Abdominal: Soft, obese, still slightly distended; normal bowel sounds are heard normally extremities: Right lower extremity edema still present; no clubbing.  Right knee swelling slight erythema around incision site  CNS: Awake and oriented.  Answers questions appropriately.  No focal neurologic deficit.  Able to move extremities. Lymph: No obvious lymphadenopathy  skin: No obvious ulcers/lesions  psych: Affect, mood and judgment are normal  musculoskeletal: No obvious other joint swelling/deformity    Data Reviewed: I have personally reviewed following labs and imaging studies  CBC: Recent Labs  Lab 06/21/22 1542 06/22/22 0842 06/23/22 0400 06/24/22 0341 06/25/22 0403  WBC 12.3* 6.3 7.5 6.4 6.4  NEUTROABS 10.0* 5.1 5.9 4.8 4.8  HGB 12.0* 10.4* 10.5* 9.8* 9.9*  HCT 36.4* 31.1* 31.9* 30.2* 31.0*  MCV 94.1 94.2 93.5 94.1 94.5  PLT 183 138* 169 178 [id number]    Basic Metabolic Panel: Recent Labs  Lab 06/21/22 1542 06/22/22 0842 06/23/22 0400 06/24/22 0341 06/25/22 0403  NA 136 139 138 136 136  K 4.2 3.6 3.4* 3.5 3.5  CL 105 108 105 106 105  CO2 20* 23 22 22 23   GLUCOSE 131* 105* 113* 109* 121*  BUN 16 16 22 21  24*  CREATININE 1.13 0.95 1.03 0.96 0.98  CALCIUM 9.4 8.8* 8.8* 8.4* 8.6*  MG  --  2.0 2.1 2.2 2.2    GFR: Estimated Creatinine Clearance: 88.2 mL/min (by C-G formula based on SCr of 0.98 mg/dL). Liver Function Tests: Recent Labs  Lab 06/20/22 1656 06/21/22 1542 06/23/22 0400  AST 24 30 24   ALT 18 19 22   ALKPHOS 64 76 68  BILITOT 1.1 1.3* 1.0  PROT 6.2* 7.4 6.0*  ALBUMIN 3.6 4.1 3.0*     No results for input(s): "LIPASE", "AMYLASE" in the last 168 hours. No results for input(s): "AMMONIA" in the last 168 hours. Coagulation Profile: Recent Labs  Lab 06/20/22 1656 06/21/22 1542  INR 1.3* 1.1    Cardiac Enzymes: No results for input(s): "CKTOTAL", "CKMB", "CKMBINDEX", "TROPONINI" in the last 168 hours. BNP (last 3 results) Recent Labs    09/19/21 0938 12/05/21 1637  PROBNP 355 183    HbA1C: No results for input(s): "HGBA1C" in the last 72 hours. CBG: No results for input(s): "GLUCAP" in the last 168 hours. Lipid Profile: No results for input(s): "CHOL", "HDL", "LDLCALC", "TRIG", "CHOLHDL", "LDLDIRECT" in the last 72 hours. Thyroid Function Tests: No results for input(s): "TSH", "T4TOTAL", "FREET4", "T3FREE", "THYROIDAB" in the last 72 hours. Anemia Panel: No results for input(s): "VITAMINB12", "FOLATE", "FERRITIN", "TIBC", "IRON", "RETICCTPCT" in the last 72 hours. Sepsis Labs: Recent Labs  Lab 06/20/22 1656 06/21/22 1542 06/21/22 1748  LATICACIDVEN 1.5 2.4* 1.6     Recent Results (from the past 240 hour(s))  Blood Culture (routine x 2)     Status: Abnormal   Collection Time: 06/20/22  4:15 PM   Specimen: BLOOD  Result Value Ref Range Status   Specimen  Description   Final    BLOOD SITE NOT SPECIFIED Performed at [facility] [address] [place], [place] [address]    Special Requests   Final    BOTTLES DRAWN AEROBIC AND ANAEROBIC Blood Culture adequate volume Performed at [facility] [address], [place], [place] [address]    Culture  Setup Time   Final    GRAM POSITIVE RODS AEROBIC BOTTLE ONLY CRITICAL RESULT CALLED TO, READ BACK BY AND VERIFIED WITH: RN KELLY NEAL 06/21/22@5 :31 BY TW                                                                                                                                                                               Culture (A)  Final    BACILLUS  SPECIES Standardized susceptibility testing for this organism is not available. Performed at [facility], [place] [address], [place], [place] [address]    Report Status 06/22/2022 FINAL  Final  Blood Culture (routine x 2)     Status: None   Collection Time: 06/20/22  4:24 PM   Specimen: BLOOD  Result Value Ref Range Status   Specimen Description   Final    BLOOD SITE NOT SPECIFIED Performed at [facility] [address], [place], [place] [address]    Special Requests   Final    BOTTLES DRAWN AEROBIC AND ANAEROBIC Blood Culture adequate volume Performed at [facility] [address] [place], [place] [address]    Culture   Final    NO GROWTH 5 DAYS Performed at [facility], [place] [address] [place], [place] [address]    Report Status 06/25/2022 FINAL  Final  Urine Culture     Status: None   Collection Time: 06/20/22  5:34 PM   Specimen: In/Out Cath Urine  Result Value Ref Range Status   Specimen Description   Final    IN/OUT CATH URINE Performed at [facility] [address], [place], [place] [address]    Special Requests   Final    NONE Performed at [facility], [place] [address] [place], [place] [address]    Culture   Final    NO GROWTH Performed at [facility], [place] [address] [place],  [address]    Report Status 06/21/2022 FINAL  Final  Blood Culture (routine x 2)     Status: None (Preliminary result)   Collection Time: 06/21/22  4:10 PM   Specimen: BLOOD  Result Value Ref Range Status   Specimen Description   Final    BLOOD SITE NOT SPECIFIED Performed at [facility], [address] [place], [place] [address]    Special Requests   Final    BOTTLES DRAWN AEROBIC AND ANAEROBIC Blood Culture adequate volume Performed at [facility], [address] [place], [place] [address]    Culture   Final    NO GROWTH 4  DAYS Performed at [facility], [address], [place], [place] [address]    Report Status PENDING  Incomplete  Resp Panel by RT-PCR (Flu A&B, Covid) Anterior Nasal Swab     Status: None   Collection Time: 06/21/22  5:51 PM   Specimen: Anterior Nasal Swab  Result Value Ref Range Status   SARS Coronavirus 2 by RT PCR NEGATIVE NEGATIVE Final    Comment: (NOTE) SARS-CoV-2 target nucleic acids are NOT DETECTED.  The SARS-CoV-2 RNA is generally detectable in upper respiratory specimens during the acute phase of infection. The lowest concentration of SARS-CoV-2 viral copies this assay can detect is 138 copies/mL. A negative result does not preclude SARS-Cov-2 infection and should not be used as the sole basis for treatment or other patient management decisions. A negative result may occur with  improper specimen collection/handling, submission of specimen other than nasopharyngeal swab, presence of viral mutation(s) within the areas targeted by this assay, and inadequate number of viral copies(<138 copies/mL). A negative result must be combined with clinical observations, patient history, and epidemiological information. The expected result is Negative.  Fact Sheet for Patients:  BloggerCourse.com  Fact Sheet for Healthcare Providers:  SeriousBroker.it  This test is no t yet approved or cleared by the [place] FDA and  has been authorized for detection and/or diagnosis of SARS-CoV-2 by FDA under an Emergency Use Authorization (EUA). This EUA will remain  in effect (meaning this test can be used) for the duration of the COVID-19 declaration under Section 564(b)(1) of the Act, 21 U.S.C.section 360bbb-3(b)(1), unless the authorization is terminated  or revoked sooner.       Influenza A by PCR NEGATIVE NEGATIVE Final   Influenza B by PCR NEGATIVE NEGATIVE Final    Comment: (NOTE) The Xpert Xpress SARS-CoV-2/FLU/RSV plus  assay is intended as an aid in the diagnosis of influenza from Nasopharyngeal swab specimens and should not be used as a sole basis for treatment. Nasal washings and aspirates are unacceptable for Xpert Xpress SARS-CoV-2/FLU/RSV testing.  Fact Sheet for Patients: BloggerCourse.com  Fact Sheet for Healthcare Providers: SeriousBroker.it  This test is not yet approved or cleared by the [place] FDA and has been authorized for detection and/or diagnosis of SARS-CoV-2 by FDA under an Emergency Use Authorization (EUA). This EUA will remain in effect (meaning this test can be used) for the duration of the COVID-19 declaration under Section 564(b)(1) of the Act, 21 U.S.C. section 360bbb-3(b)(1), unless the authorization is terminated or revoked.  Performed at [facility], [address] [place], [place] [address]   Urine Culture     Status: None   Collection Time: 06/21/22  6:24 PM   Specimen: In/Out Cath Urine  Result Value Ref Range Status   Specimen Description   Final    IN/OUT CATH URINE Performed at [facility], [address] [place], [place] [address]    Special Requests   Final    NONE Performed at [facility], [address], [place], [place] [address]    Culture   Final    NO GROWTH Performed at [facility], [address] [place],  [place] [address]    Report Status 06/22/2022 FINAL  Final         Radiology Studies: ECHOCARDIOGRAM COMPLETE  Result Date: 06/23/2022    ECHOCARDIOGRAM REPORT   Patient Name:   NYZAIAH BAYLIS Date of Exam: 06/23/2022 Medical Rec #:  [id number]      Height:       68.0 in Accession #:    [id number]     Weight:       230.8 lb Date of Birth:  1956-05-14       BSA:          2.172 m Patient Age:    82 years       BP:           151/74 mmHg Patient Gender: M              HR:           95 bpm. Exam Location:  Inpatient  Procedure: 2D Echo, Cardiac Doppler and Color Doppler Indications:    Bacteremia R78.81  History:        Patient has prior history of Echocardiogram examinations, most                 recent 08/03/2021. CAD; Signs/Symptoms:Dyspnea.  Sonographer:    Luane School RDCS Referring Phys: [id number] [place]  1. There is a highly mobile density that appears attached to the RV device lead concerning for possible vegetation vs thrombus (clips 2, 12). Recommend TEE for further evaluation.  2. Left ventricular ejection fraction, by estimation, is 65 to 70%. The left ventricle has normal function. The left ventricle has no regional wall motion abnormalities. There is moderate concentric left ventricular hypertrophy. Left ventricular diastolic parameters are consistent with Grade I diastolic dysfunction (impaired relaxation).  3. Right ventricular systolic function is normal. The right ventricular size is mildly enlarged. There is normal pulmonary artery systolic pressure. The estimated right ventricular systolic pressure is [id number] mmHg.  4. Left atrial size was mildly dilated.  5. A small pericardial effusion is present. The pericardial effusion is posterior and lateral to the left ventricle.  6. The mitral valve is grossly normal. Trivial mitral valve regurgitation.  7. The aortic valve is tricuspid. There is mild calcification of the aortic valve. There is mild thickening of the aortic valve. Aortic valve regurgitation is trivial. Aortic valve sclerosis/calcification is present, without any evidence of aortic stenosis.  8. Aortic dilatation noted. There is moderate dilatation of the aortic root, measuring 47 mm. There is moderate dilatation of the ascending aorta, measuring 47 mm.  9. The inferior vena cava is normal in size with greater than 50% respiratory variability, suggesting right atrial pressure of 3 mmHg. Comparison(s): Compared to prior TTE on 08/03/21, there is now a mobile density visualized on the device  lead as detailed above. FINDINGS  Left Ventricle: Left ventricular ejection fraction, by estimation, is 65 to 70%. The left ventricle has normal function. The left ventricle has no regional wall motion abnormalities. The left ventricular internal cavity size was normal in size. There is  moderate concentric left ventricular hypertrophy. Left ventricular diastolic parameters are consistent with Grade I diastolic dysfunction (impaired relaxation). Right Ventricle: There is a highly mobile density that appears attached to the RV device lead concerning for possible vegetation vs thrombus. Recommend TEE for further evaluation. The right ventricular size is mildly enlarged. No increase in right ventricular wall thickness. Right ventricular systolic function is normal. There is normal  pulmonary artery systolic pressure. The tricuspid regurgitant velocity is 1.79 m/s, and with an assumed right atrial pressure of 3 mmHg, the estimated right ventricular systolic pressure is Q000111Q mmHg. Left Atrium: Left atrial size was mildly dilated. Right Atrium: Right atrial size was not well visualized. Pericardium: A small pericardial effusion is present. The pericardial effusion is posterior and lateral to the left ventricle. Mitral Valve: The mitral valve is grossly normal. There is mild thickening of the mitral valve leaflet(s). There is mild calcification of the mitral valve leaflet(s). Mild mitral annular calcification. Trivial mitral valve regurgitation. Tricuspid Valve: The tricuspid valve is normal in structure. Tricuspid valve regurgitation is trivial. Aortic Valve: The aortic valve is tricuspid. There is mild calcification of the aortic valve. There is mild thickening of the aortic valve. Aortic valve regurgitation is trivial. Aortic valve sclerosis/calcification is present, without any evidence of aortic stenosis. Pulmonic Valve: The pulmonic valve was not well visualized. Pulmonic valve regurgitation is trivial. Aorta: Aortic  dilatation noted. There is moderate dilatation of the aortic root, measuring 47 mm. There is moderate dilatation of the ascending aorta, measuring 47 mm. Venous: The inferior vena cava is normal in size with greater than 50% respiratory variability, suggesting right atrial pressure of 3 mmHg. IAS/Shunts: The interatrial septum was not well visualized. Additional Comments: A device lead is visualized.  LEFT VENTRICLE PLAX 2D LVIDd:         4.70 cm LVIDs:         2.60 cm LV PW:         1.60 cm LV IVS:        1.60 cm LVOT diam:     2.60 cm LV SV:         74 LV SV Index:   34 LVOT Area:     5.31 cm  RIGHT VENTRICLE             IVC RV S prime:     22.90 cm/s  IVC diam: 2.20 cm TAPSE (M-mode): 3.4 cm LEFT ATRIUM           Index        RIGHT ATRIUM           Index LA diam:      3.90 cm 1.80 cm/m   RA Area:     27.70 cm LA Vol (A4C): 63.3 ml 29.14 ml/m  RA Volume:   80.60 ml  37.11 ml/m  AORTIC VALVE LVOT Vmax:   91.30 cm/s LVOT Vmean:  59.100 cm/s LVOT VTI:    0.140 m  AORTA Ao Root diam: 4.70 cm Ao Asc diam:  4.70 cm Ao Desc diam: 2.40 cm MITRAL VALVE               TRICUSPID VALVE MV Area (PHT): 4.36 cm    TR Peak grad:   12.8 mmHg MV Decel Time: 174 msec    TR Vmax:        179.00 cm/s MV E velocity: 53.60 cm/s MV A velocity: 86.40 cm/s  SHUNTS MV E/A ratio:  0.62        Systemic VTI:  0.14 m                            Systemic Diam: 2.60 cm Gwyndolyn Kaufman MD Electronically signed by Gwyndolyn Kaufman MD Signature Date/Time: 06/23/2022/1:03:37 PM    Final         Scheduled Meds:  allopurinol  100  mg Oral Daily   apixaban  2.5 mg Oral BID   gabapentin  600 mg Oral TID   hydrALAZINE  100 mg Oral TID   loratadine  10 mg Oral Daily   montelukast  10 mg Oral QHS   polyethylene glycol  17 g Oral BID   senna-docusate  2 tablet Oral BID   sotalol  120 mg Oral Q12H   spironolactone  25 mg Oral Daily   Continuous Infusions:          Ryan Lloyd, MD [facility] 06/25/2022, 11:06 AM

## 2022-06-25 NOTE — Evaluation (Signed)
Occupational Therapy Evaluation Patient Details Name: Ryan Glass MRN: [id number] DOB: 1956-02-20 Today's Date: 06/25/2022   History of Present Illness 66 yo male admitted with bacteremia, gout flare, sepsis. Recent hx of R TKA 06/18/22. Hx of L TKA, AICD, Afib, CHF, gout, CKD, CAD, OSA, hernia repair, RA   Clinical Impression   Patient is a 66 year old male who was living at home with wife prior level. Currently, patient is TD for LB dressing and ADL tasks in standing with decreased functional activity tolerance and increased pain with WB ob RLE. Patient would continue to benefit from skilled OT services at this time while admitted and after d/c to address noted deficits in order to improve overall safety and independence in ADLs.       Recommendations for follow up therapy are one component of a multi-disciplinary discharge planning process, led by the attending physician.  Recommendations may be updated based on patient status, additional functional criteria and insurance authorization.   Follow Up Recommendations  Skilled nursing-short term rehab (<3 hours/day)    Assistance Recommended at Discharge Frequent or constant Supervision/Assistance  Patient can return home with the following A lot of help with walking and/or transfers;A lot of help with bathing/dressing/bathroom;Assistance with cooking/housework;Assist for transportation;Help with stairs or ramp for entrance    Functional Status Assessment  Patient has had a recent decline in their functional status and demonstrates the ability to make significant improvements in function in a reasonable and predictable amount of time.  Equipment Recommendations  Other (comment) (TBD)    Recommendations for Other Services       Precautions / Restrictions Precautions Precautions: Fall Precaution Comments: instructed no pillow under knee Restrictions Weight Bearing Restrictions: Yes RLE Weight Bearing: Weight bearing as  tolerated Other Position/Activity Restrictions: WBAT      Mobility Bed Mobility Overal bed mobility: Needs Assistance Bed Mobility: Supine to Sit     Supine to sit: HOB elevated, Mod assist     General bed mobility comments: min A for RLE to edge of bed with support to maintain sitting balance with some posterior leaning noted    Transfers                          Balance Overall balance assessment: Needs assistance Sitting-balance support: Feet supported, No upper extremity supported Sitting balance-Leahy Scale: Good     Standing balance support: Reliant on assistive device for balance, During functional activity, Bilateral upper extremity supported Standing balance-Leahy Scale: Poor                             ADL either performed or assessed with clinical judgement   ADL Overall ADL's : Needs assistance/impaired Eating/Feeding: Set up;Sitting   Grooming: Set up;Sitting;Wash/dry face Grooming Details (indicate cue type and reason): in recliner Upper Body Bathing: Minimal assistance;Sitting   Lower Body Bathing: Maximal assistance;Sit to/from stand;Sitting/lateral leans   Upper Body Dressing : Minimal assistance;Sitting   Lower Body Dressing: Maximal assistance;Sit to/from stand;Sitting/lateral leans   Toilet Transfer: +2 for safety/equipment;Minimal assistance;+2 for physical assistance Toilet Transfer Details (indicate cue type and reason): for transfers with RW with education on leaning nose over toes. Toileting- Clothing Manipulation and Hygiene: Maximal assistance;Sit to/from stand Toileting - Clothing Manipulation Details (indicate cue type and reason): unable to maintain balance and complete hygiene in standing. patient unaware of incontiennce episode. nurse made aware.  Vision Patient Visual Report: No change from baseline       Perception     Praxis      Pertinent Vitals/Pain Pain Assessment Pain Assessment:  0-10 Pain Score: 7  Pain Location: R knee Pain Descriptors / Indicators: Aching, Discomfort, Guarding, Grimacing Pain Intervention(s): Monitored during session, Limited activity within patient's tolerance, Repositioned     Hand Dominance Right   Extremity/Trunk Assessment Upper Extremity Assessment Upper Extremity Assessment: Overall WFL for tasks assessed   Lower Extremity Assessment Lower Extremity Assessment: Defer to PT evaluation   Cervical / Trunk Assessment Cervical / Trunk Assessment: Normal   Communication Communication Communication: No difficulties   Cognition Arousal/Alertness: Awake/alert Behavior During Therapy: WFL for tasks assessed/performed Overall Cognitive Status: Within Functional Limits for tasks assessed                                 General Comments: Very pleasant and motivated     General Comments       Exercises     Shoulder Instructions      Home Living Family/patient expects to be discharged to:: Private residence Living Arrangements: Spouse/significant other Available Help at Discharge: Family;Available 24 hours/day Type of Home: House Home Access: Stairs to enter [employer] of Steps: 5-6 Entrance Stairs-Rails: Left;Can reach both;Right Home Layout: Multi-level Alternate Level Stairs-Number of Steps: Flight Alternate Level Stairs-Rails: Right;Left Bathroom Shower/Tub: Producer, television/film/video: Standard Bathroom Accessibility: No   Home Equipment: Grab bars - tub/shower;Cane - single Librarian, academic (2 wheels)   Additional Comments: one railing to the third floor that has been pulled out previously but has since been repaired but may not be secure      Prior Functioning/Environment Prior Level of Function : Independent/Modified Independent             Mobility Comments: SPC while in pain, RW if out in the community ADLs Comments: IND        OT Problem List: Decreased activity  tolerance;Impaired balance (sitting and/or standing);Decreased safety awareness;Decreased range of motion;Decreased strength;Pain;Decreased knowledge of precautions;Decreased knowledge of use of DME or AE      OT Treatment/Interventions: Self-care/ADL training;Therapeutic exercise;Neuromuscular education;Energy conservation;DME and/or AE instruction;Therapeutic activities;Balance training;Patient/family education    OT Goals(Current goals can be found in the care plan section) Acute Rehab OT Goals Patient Stated Goal: to go home tomorrow OT Goal Formulation: With patient Time For Goal Achievement: 07/09/22 Potential to Achieve Goals: Fair  OT Frequency: Min 2X/week    Co-evaluation PT/OT/SLP Co-Evaluation/Treatment: Yes Reason for Co-Treatment: For patient/therapist safety PT goals addressed during session: Mobility/safety with mobility OT goals addressed during session: ADL's and self-care      AM-PAC OT "6 Clicks" Daily Activity     Outcome Measure Help from another person eating meals?: None Help from another person taking care of personal grooming?: None Help from another person toileting, which includes using toliet, bedpan, or urinal?: A Lot Help from another person bathing (including washing, rinsing, drying)?: A Lot Help from another person to put on and taking off regular upper body clothing?: A Little Help from another person to put on and taking off regular lower body clothing?: A Lot 6 Click Score: 17   End of Session Equipment Utilized During Treatment: Gait belt;Rolling walker (2 wheels) Nurse Communication: Other (comment) (patients dizziness and pain during session)  Activity Tolerance: Patient tolerated treatment well Patient left: in chair;with call bell/phone within  reach;with chair alarm set  OT Visit Diagnosis: Unsteadiness on feet (R26.81);Other abnormalities of gait and mobility (R26.89);Muscle weakness (generalized) (M62.81);Pain Pain - Right/Left:  Right Pain - part of body: Leg                Time: 6283-1517 OT Time Calculation (min): 26 min Charges:  OT General Charges $OT Visit: 1 Visit OT Evaluation $OT Eval Moderate Complexity: 1 Mod  Sharyn Blitz OTR/L, MS Acute Rehabilitation Department Office# [phone] Pager# [phone]   Ardyth Harps 06/25/2022, 2:14 PM

## 2022-06-25 NOTE — Progress Notes (Signed)
Physical Therapy Treatment Patient Details Name: Ryan Glass MRN: [id number] DOB: 1956/08/28 Today's Date: 06/25/2022   History of Present Illness 66 yo male admitted with bacteremia, gout flare, sepsis. Recent hx of R TKA 06/18/22. Hx of L TKA, AICD, Afib, CHF, gout, CKD, CAD, OSA, hernia repair, RA    PT Comments    Pt is POD7 R-TKA performed 06/18/22.  Pt supine in bed agreeable to be seen by PT/OT co-treat for safety and pt's pain status--reporting 7/10 pain despite recent pain medication administration. Pt required min assist for bed mobility and transfers, min guard for ambulation in hallway ~64ft with RW and +2 for recliner follow for safety, distance limited by pain and dizziness that resolved with short seated rest break. Pt would benefit from additional ambulation outside of therapy sessions, pt agreeable and RN notified. Pt's mobility goals limited by pain and pt's fear to WB on operative extremity, would benefit from additional sessions and will attempt to provide as schedule and pt status allows. Discharge destination remains appropriate, we will continue to follow acutely.   Recommendations for follow up therapy are one component of a multi-disciplinary discharge planning process, led by the attending physician.  Recommendations may be updated based on patient status, additional functional criteria and insurance authorization.  Follow Up Recommendations  Home health PT (vs SNF-depending on progress)     Assistance Recommended at Discharge Frequent or constant Supervision/Assistance  Patient can return home with the following A little help with walking and/or transfers;A little help with bathing/dressing/bathroom;Assistance with cooking/housework;Assist for transportation;Help with stairs or ramp for entrance   Equipment Recommendations  None recommended by PT    Recommendations for Other Services OT consult     Precautions / Restrictions Precautions Precautions:  Fall Restrictions Weight Bearing Restrictions: Yes RLE Weight Bearing: Weight bearing as tolerated Other Position/Activity Restrictions: WBAT     Mobility  Bed Mobility Overal bed mobility: Needs Assistance Bed Mobility: Supine to Sit     Supine to sit: Min assist, HOB elevated     General bed mobility comments: Min A for R LE off bed.    Transfers Overall transfer level: Needs assistance Equipment used: Rolling walker (2 wheels) Transfers: Sit to/from Stand Sit to Stand: Min assist, From elevated surface           General transfer comment: Min assist for steadying of RW, cues for hand placement and use of LLE.    Ambulation/Gait Ambulation/Gait assistance: Min guard, +2 safety/equipment Gait Distance (Feet): 12 Feet Assistive device: Rolling walker (2 wheels) Gait Pattern/deviations: Step-to pattern, Antalgic, Decreased dorsiflexion - right, Decreased weight shift to right Gait velocity: decreased     General Gait Details: Pt ambulated 4ft with RW and min guard, no physical assist provided or overt LOB noted, +2 for recliner follow for safety only. Pt reported dizziness, instructed pt to sit, VSS, dizziness abated with seeated rest break, RN notified. Further mobility deferred.  Ambulation distance limited by pain & dizziness today.   Stairs             Wheelchair Mobility    Modified Rankin (Stroke Patients Only)       Balance Overall balance assessment: Needs assistance Sitting-balance support: Feet supported, No upper extremity supported Sitting balance-Leahy Scale: Good     Standing balance support: Reliant on assistive device for balance, During functional activity, Bilateral upper extremity supported Standing balance-Leahy Scale: Poor  Cognition Arousal/Alertness: Awake/alert Behavior During Therapy: WFL for tasks assessed/performed Overall Cognitive Status: Within Functional Limits for tasks  assessed                                          Exercises      General Comments        Pertinent Vitals/Pain Pain Assessment Pain Assessment: 0-10 Pain Score: 7  Pain Location: R knee Pain Descriptors / Indicators: Aching, Discomfort, Guarding, Grimacing Pain Intervention(s): Monitored during session, Limited activity within patient's tolerance, Repositioned, Ice applied    Home Living                          Prior Function            PT Goals (current goals can now be found in the care plan section) Acute Rehab PT Goals Patient Stated Goal: less pain PT Goal Formulation: With patient Time For Goal Achievement: 07/08/22 Potential to Achieve Goals: Good Progress towards PT goals: Progressing toward goals    Frequency    Min 5X/week      PT Plan Current plan remains appropriate    Co-evaluation PT/OT/SLP Co-Evaluation/Treatment: Yes Reason for Co-Treatment: For patient/therapist safety PT goals addressed during session: Mobility/safety with mobility OT goals addressed during session: ADL's and self-care      AM-PAC PT "6 Clicks" Mobility   Outcome Measure  Help needed turning from your back to your side while in a flat bed without using bedrails?: A Little Help needed moving from lying on your back to sitting on the side of a flat bed without using bedrails?: A Lot Help needed moving to and from a bed to a chair (including a wheelchair)?: A Lot Help needed standing up from a chair using your arms (e.g., wheelchair or bedside chair)?: A Lot Help needed to walk in hospital room?: A Little Help needed climbing 3-5 steps with a railing? : Total 6 Click Score: 13    End of Session Equipment Utilized During Treatment: Gait belt Activity Tolerance: Patient limited by pain;Other (comment) (dizzy) Patient left: with call bell/phone within reach;in chair;with chair alarm set Nurse Communication: Mobility status PT Visit Diagnosis:  Pain;Difficulty in walking, not elsewhere classified (R26.2) Pain - Right/Left: Right Pain - part of body: Knee     Time: 7680-8811 PT Time Calculation (min) (ACUTE ONLY): 26 min  Charges:  $Gait Training: 8-22 mins                    Jamesetta Geralds, PT, DPT [facility] Rehabilitation Department Office: [phone] Pager: [phone]   Jamesetta Geralds 06/25/2022, 1:34 PM

## 2022-06-25 NOTE — Plan of Care (Signed)

## 2022-06-25 NOTE — Progress Notes (Signed)
PATIENT ID: Ryan Glass  MRN: [id number]  DOB/AGE:  01/04/1956 / 66 y.o.        PROGRESS NOTE Subjective: Patient is alert, oriented, no Nausea, no Vomiting, yes passing gas. Taking PO well. Denies SOB, Chest or Calf Pain. Using Incentive Spirometer, PAS in place. Ambulate WBAT with pt walking 10 ft, Patient reports pain as moderate .    Objective: Vital signs in last 24 hours: Vitals:   06/24/22 1407 06/24/22 1856 06/24/22 2133 06/25/22 0417  BP: 131/85  117/71 122/83  Pulse: 89  90 90  Resp: 20  16   Temp: 100 F (37.8 C) 99.1 F (37.3 C) 99.3 F (37.4 C) 99 F (37.2 C)  TempSrc: Oral Oral Oral Oral  SpO2: 96%  95% 97%  Weight:    107.7 kg  Height:          Intake/Output from previous day: I/O last 3 completed shifts: In: 1137.5 [P.O.:250; IV Piggyback:887.5] Out: 1800 [Urine:1800]   Intake/Output this shift: No intake/output data recorded.   LABORATORY DATA: Recent Labs    06/24/22 0341 06/25/22 0403  WBC 6.4 6.4  HGB 9.8* 9.9*  HCT 30.2* 31.0*  PLT 178 208  NA 136 136  K 3.5 3.5  CL 106 105  CO2 22 23  BUN 21 24*  CREATININE 0.96 0.98  GLUCOSE 109* 121*  CALCIUM 8.4* 8.6*    Examination: Neurologically intact Neurovascular intact Sensation intact distally Intact pulses distally Dorsiflexion/Plantar flexion intact} Knee continues to improve.  Swelling in calf and knee continue to diminish  Foot continues to have 2+ swelling and tenderness over 1st ray consistent with gout.    Assessment:     1. Likely gout flare following total knee 2.  Postoperative constipation  ADDITIONAL DIAGNOSIS: Expected Acute Blood Loss Anemia, Sleep Apnea and Gout   Plan: Continue physical therapy and when pt passes therapy he is able to go home from an ortho standpoint.  Plan on follow up in 1 week.  Continue with bowel care as needed with bisacodyl and fleets enema     Dannielle Burn 06/25/2022, 7:17 AM

## 2022-06-26 DIAGNOSIS — A419 Sepsis, unspecified organism: Secondary | ICD-10-CM | POA: Diagnosis not present

## 2022-06-26 DIAGNOSIS — R778 Other specified abnormalities of plasma proteins: Secondary | ICD-10-CM | POA: Diagnosis not present

## 2022-06-26 DIAGNOSIS — M25561 Pain in right knee: Secondary | ICD-10-CM

## 2022-06-26 DIAGNOSIS — I480 Paroxysmal atrial fibrillation: Secondary | ICD-10-CM | POA: Diagnosis not present

## 2022-06-26 DIAGNOSIS — R7881 Bacteremia: Secondary | ICD-10-CM | POA: Diagnosis not present

## 2022-06-26 LAB — CBC WITH DIFFERENTIAL/PLATELET
Abs Immature Granulocytes: 0.02 10*3/uL (ref 0.00–0.07)
Basophils Absolute: 0 10*3/uL (ref 0.0–0.1)
Basophils Relative: 0 %
Eosinophils Absolute: 0 10*3/uL (ref 0.0–0.5)
Eosinophils Relative: 0 %
HCT: 33.5 % — ABNORMAL LOW (ref 39.0–52.0)
Hemoglobin: 10.8 g/dL — ABNORMAL LOW (ref 13.0–17.0)
Immature Granulocytes: 0 %
Lymphocytes Relative: 10 %
Lymphs Abs: 0.6 10*3/uL — ABNORMAL LOW (ref 0.7–4.0)
MCH: 29.8 pg (ref 26.0–34.0)
MCHC: 32.2 g/dL (ref 30.0–36.0)
MCV: 92.5 fL (ref 80.0–100.0)
Monocytes Absolute: 0.2 10*3/uL (ref 0.1–1.0)
Monocytes Relative: 3 %
Neutro Abs: 5.1 10*3/uL (ref 1.7–7.7)
Neutrophils Relative %: 87 %
Platelets: 249 10*3/uL (ref 150–400)
RBC: 3.62 MIL/uL — ABNORMAL LOW (ref 4.22–5.81)
RDW: 13.6 % (ref 11.5–15.5)
WBC: 5.9 10*3/uL (ref 4.0–10.5)
nRBC: 0 % (ref 0.0–0.2)

## 2022-06-26 LAB — BASIC METABOLIC PANEL
Anion gap: 8 (ref 5–15)
BUN: 28 mg/dL — ABNORMAL HIGH (ref 8–23)
CO2: 21 mmol/L — ABNORMAL LOW (ref 22–32)
Calcium: 8.9 mg/dL (ref 8.9–10.3)
Chloride: 106 mmol/L (ref 98–111)
Creatinine, Ser: 1 mg/dL (ref 0.61–1.24)
GFR, Estimated: >60 mL/min (ref >60)
Glucose, Bld: 137 mg/dL — ABNORMAL HIGH (ref 70–99)
Potassium: 4.1 mmol/L (ref 3.5–5.1)
Sodium: 135 mmol/L (ref 135–145)

## 2022-06-26 LAB — CULTURE, BLOOD (ROUTINE X 2)
Culture: NO GROWTH
Special Requests: ADEQUATE

## 2022-06-26 LAB — MAGNESIUM: Magnesium: 2.3 mg/dL (ref 1.7–2.4)

## 2022-06-26 MED ORDER — METHYLPREDNISOLONE SODIUM SUCC 125 MG IJ SOLR
60.0000 mg | Freq: Once | INTRAMUSCULAR | Status: AC
Start: 2022-06-26 — End: 2022-06-26
  Administered 2022-06-26: 60 mg via INTRAVENOUS
  Filled 2022-06-26: qty 2

## 2022-06-26 NOTE — Telephone Encounter (Signed)
Patient still inpatient epic reviewed and patient contacted via telephone.  Patient stated had PT today only able to go down 3 stairs and solumedrol IV for pain/gout leg. Patient stated it has been recommended he goes to rehab facility for a couple days as weak/requiring assistance unable to walk 6 steps up/down required to navigate in his house to bathroom.  Patient reported had fever 100 degrees again last night sweating but antibiotics stopped as blood culture repeat negative.  Discussed with patient CBC stable and no elevated WBC indicating infection present and blood cultures repeat still negative for growth.  Discussed with patient if too weak to navigate stairs at home I would recommend he goes to rehab facility so can build strength again to manage stairs at home.  Patient reported pain improving  and he does think he pain was related to surgery and gout flare.  Patient stated food has been better the past couple of days in the hospital.  He and his wife have been reading more about gout and ensuring he is not eating purine rich foods.  Orthopedic surgeon has been checking on him in hospital typically 6am.  He asked surgeon for updated work note for disability insurance and HR.  Dr Turner Daniels told him to call his office and notify his staff as he was going into surgery for the day.  Notified patient I would check in epic if any letters generated for him and if yes I would send him copy and copy to HR.  If patient did not hear from me tonight I could not see any letters from Dr Wadie Lessen office in his chart and he should call his office tomorrow.  Patient thinks he will be in hospital another day or two.  Unsure if room available at any network rehab facilities at this time.  Trying to get home PT/care set up also unsure of availability.  Patient A&Ox3 spoke full sentences without difficulty agreed with plan of care verbalized understanding of information/instructions and had no further questions at this time.   Duration of call 21 minutes.

## 2022-06-26 NOTE — Progress Notes (Signed)
Physical Therapy Treatment Patient Details Name: Ryan Glass MRN: [id number] DOB: October 15, 1956 Today's Date: 06/26/2022   History of Present Illness 66 yo male admitted with bacteremia, gout flare, sepsis. Recent hx of R TKA 06/18/22. Hx of L TKA, AICD, Afib, CHF, gout, CKD, CAD, OSA, hernia repair, RA    PT Comments    Pt is POD8 s/p R-TKA.  Pt supine in bed agreeable to be seen and very motivated, expressing desire to go home today. Started session with supine HEP, pt completed most exercises actively except heel slides during which pt required therapist active assistive motion secondary to joint pain. Encouraged pt to complete exercises outside of therapy session to promote bloodflow and improve ROM, pt verbalized understanding. Pt ambulated 76ft with RW and min guard demonstrating improved RLE weightbearing compared to yesterday's session. Will attempt second session today to complete stair training as pt has MANY stairs in home and pt's wife unable to provide physical assistance. Will continue to follow acutely.    Recommendations for follow up therapy are one component of a multi-disciplinary discharge planning process, led by the attending physician.  Recommendations may be updated based on patient status, additional functional criteria and insurance authorization.  Follow Up Recommendations  Home health PT     Assistance Recommended at Discharge Frequent or constant Supervision/Assistance  Patient can return home with the following A little help with walking and/or transfers;A little help with bathing/dressing/bathroom;Assistance with cooking/housework;Assist for transportation;Help with stairs or ramp for entrance   Equipment Recommendations  None recommended by PT    Recommendations for Other Services OT consult     Precautions / Restrictions Precautions Precautions: Fall Precaution Comments: Instructed to not adjust knee portion of bed, locked out bed controls for  knee. Restrictions Weight Bearing Restrictions: No RLE Weight Bearing: Weight bearing as tolerated Other Position/Activity Restrictions: WBAT     Mobility  Bed Mobility Overal bed mobility: Needs Assistance Bed Mobility: Supine to Sit     Supine to sit: HOB elevated, Supervision     General bed mobility comments: Supervision for safety only    Transfers Overall transfer level: Needs assistance Equipment used: Rolling walker (2 wheels) Transfers: Sit to/from Stand Sit to Stand: Min guard           General transfer comment: For safety only    Ambulation/Gait Ambulation/Gait assistance: Min guard Gait Distance (Feet): 60 Feet Assistive device: Rolling walker (2 wheels) Gait Pattern/deviations: Step-to pattern, Antalgic, Decreased dorsiflexion - right, Decreased weight shift to right Gait velocity: decreased     General Gait Details: Pt ambulated 67ft, seated rest break ~26min, then an additional 98ft; utilized RW with min guard assist, no physical assist or overt LOB noted, VSS. Pt demonstrated increased WB on RLE compared to yesterday's session.   Stairs             Wheelchair Mobility    Modified Rankin (Stroke Patients Only)       Balance Overall balance assessment: Needs assistance Sitting-balance support: Feet supported, No upper extremity supported Sitting balance-Leahy Scale: Good     Standing balance support: Reliant on assistive device for balance, During functional activity, Bilateral upper extremity supported Standing balance-Leahy Scale: Poor                              Cognition Arousal/Alertness: Awake/alert Behavior During Therapy: WFL for tasks assessed/performed Overall Cognitive Status: Within Functional Limits for tasks assessed  Exercises Total Joint Exercises Ankle Circles/Pumps: AROM, Both, 10 reps, Supine Quad Sets: AROM, Both, 10 reps, Supine Heel  Slides: AROM, Right, 10 reps, Supine Hip ABduction/ADduction: AROM, Right, 10 reps, Supine Straight Leg Raises: AROM, 10 reps, Right, Supine    General Comments        Pertinent Vitals/Pain Pain Assessment Pain Assessment: 0-10 Pain Score: 7  Pain Location: R knee Pain Descriptors / Indicators: Aching, Discomfort, Guarding, Grimacing Pain Intervention(s): Limited activity within patient's tolerance, Monitored during session, Repositioned, Ice applied    Home Living                          Prior Function            PT Goals (current goals can now be found in the care plan section) Acute Rehab PT Goals Patient Stated Goal: less pain PT Goal Formulation: With patient Time For Goal Achievement: 07/08/22 Potential to Achieve Goals: Good Progress towards PT goals: Progressing toward goals    Frequency    Min 5X/week      PT Plan Current plan remains appropriate    Co-evaluation              AM-PAC PT "6 Clicks" Mobility   Outcome Measure  Help needed turning from your back to your side while in a flat bed without using bedrails?: A Little Help needed moving from lying on your back to sitting on the side of a flat bed without using bedrails?: A Little Help needed moving to and from a bed to a chair (including a wheelchair)?: A Little Help needed standing up from a chair using your arms (e.g., wheelchair or bedside chair)?: A Little Help needed to walk in hospital room?: A Little Help needed climbing 3-5 steps with a railing? : A Lot 6 Click Score: 17    End of Session Equipment Utilized During Treatment: Gait belt Activity Tolerance: Patient tolerated treatment well;No increased pain Patient left: with call bell/phone within reach;in chair;with chair alarm set Nurse Communication: Mobility status PT Visit Diagnosis: Pain;Difficulty in walking, not elsewhere classified (R26.2) Pain - Right/Left: Right Pain - part of body: Knee     Time:  6270-3500 PT Time Calculation (min) (ACUTE ONLY): 37 min  Charges:  $Gait Training: 8-22 mins $Therapeutic Exercise: 8-22 mins                     Jamesetta Geralds, PT, DPT [facility] Department Office: [phone] Pager: [phone]   Jamesetta Geralds 06/26/2022, 11:28 AM

## 2022-06-26 NOTE — Progress Notes (Signed)
Physical Therapy Treatment Patient Details Name: Ryan Glass MRN: [id number] DOB: 27-Dec-1955 Today's Date: 06/26/2022   History of Present Illness 66 yo male admitted with bacteremia, gout flare, sepsis. Recent hx of R TKA 06/18/22. Hx of L TKA, AICD, Afib, CHF, gout, CKD, CAD, OSA, hernia repair, RA    PT Comments    Pt is POD8 s/p R-TKA, seen for second session for stair training. Pt min guard for transfers, min guard for ambulation, and min guard +2 for stair training but pt unable to complete six-stair ascent to mimic entry into home; pt able to only ascend three steps. Per pt's report, pt's wife unable to provide physical assistance and will at work in the am 07-1299. Given the less than 24/7 assistance and the pt's inability to complete stair training this PT is updating recommendation to SNF-level therapies for short duration to improve pt's activity tolerance for safe return home; pt is agreeable. We will continue to follow acutely.    Recommendations for follow up therapy are one component of a multi-disciplinary discharge planning process, led by the attending physician.  Recommendations may be updated based on patient status, additional functional criteria and insurance authorization.  Follow Up Recommendations  Skilled nursing-short term rehab (<3 hours/day) Can patient physically be transported by private vehicle: Yes   Assistance Recommended at Discharge Frequent or constant Supervision/Assistance  Patient can return home with the following A little help with walking and/or transfers;A little help with bathing/dressing/bathroom;Assistance with cooking/housework;Assist for transportation;Help with stairs or ramp for entrance   Equipment Recommendations  None recommended by PT    Recommendations for Other Services OT consult     Precautions / Restrictions Precautions Precautions: Fall Precaution Comments: Instructed to not adjust knee portion of bed, locked out bed controls  for knee. Restrictions Weight Bearing Restrictions: No RLE Weight Bearing: Weight bearing as tolerated Other Position/Activity Restrictions: WBAT     Mobility  Bed Mobility Overal bed mobility: Needs Assistance Bed Mobility: Supine to Sit     Supine to sit: HOB elevated, Supervision     General bed mobility comments: Pt in recliner at entry and exit    Transfers Overall transfer level: Needs assistance Equipment used: Rolling walker (2 wheels) Transfers: Sit to/from Stand Sit to Stand: Min guard   Step pivot transfers: Min guard       General transfer comment: For safety only    Ambulation/Gait Ambulation/Gait assistance: Min guard Gait Distance (Feet): 15 Feet Assistive device: Rolling walker (2 wheels) Gait Pattern/deviations: Step-to pattern, Antalgic, Decreased dorsiflexion - right, Decreased weight shift to right Gait velocity: decreased     General Gait Details: Pt ambulated 70ft from hallway to stairs with RW, min guard, no physical assist required or overt LOB noted.   Stairs Stairs: Yes Stairs assistance: Min guard, +2 safety/equipment Stair Management: Step to pattern, Sideways, One rail Right Number of Stairs: 3 General stair comments: Pt educated on sideways stair mobility, verbalized understanding. Demonstrated safe technique with verbal cuing and +2 for safety, no overt LOB noted or physical assist required, min guard only. After 3 stairs pt reporting and demonstrating fatigue especially in RLE, directed pt to descend stairs, further mobility deferred   Wheelchair Mobility    Modified Rankin (Stroke Patients Only)       Balance Overall balance assessment: Needs assistance Sitting-balance support: Feet supported, No upper extremity supported Sitting balance-Leahy Scale: Good     Standing balance support: Reliant on assistive device for balance, During functional activity, Bilateral  upper extremity supported Standing balance-Leahy Scale:  Poor                              Cognition Arousal/Alertness: Awake/alert Behavior During Therapy: WFL for tasks assessed/performed Overall Cognitive Status: Within Functional Limits for tasks assessed                                          Exercises Total Joint Exercises Ankle Circles/Pumps: AROM, Both, 10 reps, Supine Quad Sets: AROM, Both, 10 reps, Supine Heel Slides: AROM, Right, 10 reps, Supine Hip ABduction/ADduction: AROM, Right, 10 reps, Supine Straight Leg Raises: AROM, 10 reps, Right, Supine    General Comments        Pertinent Vitals/Pain Pain Assessment Pain Assessment: 0-10 Pain Score: 5  Pain Location: R knee Pain Descriptors / Indicators: Aching, Discomfort, Guarding, Grimacing Pain Intervention(s): Limited activity within patient's tolerance, Monitored during session, Repositioned    Home Living                          Prior Function            PT Goals (current goals can now be found in the care plan section) Acute Rehab PT Goals Patient Stated Goal: less pain PT Goal Formulation: With patient Time For Goal Achievement: 07/08/22 Potential to Achieve Goals: Good Progress towards PT goals: Not progressing toward goals - comment (Pt unable to complete stair mobility similar to home environment)    Frequency    Min 5X/week      PT Plan Discharge plan needs to be updated    Co-evaluation              AM-PAC PT "6 Clicks" Mobility   Outcome Measure  Help needed turning from your back to your side while in a flat bed without using bedrails?: A Little Help needed moving from lying on your back to sitting on the side of a flat bed without using bedrails?: A Little Help needed moving to and from a bed to a chair (including a wheelchair)?: A Little Help needed standing up from a chair using your arms (e.g., wheelchair or bedside chair)?: A Little Help needed to walk in hospital room?: A Lot Help  needed climbing 3-5 steps with a railing? : A Lot 6 Click Score: 16    End of Session Equipment Utilized During Treatment: Gait belt Activity Tolerance: Patient tolerated treatment well;No increased pain Patient left: with call bell/phone within reach;in chair;with chair alarm set Nurse Communication: Mobility status PT Visit Diagnosis: Pain;Difficulty in walking, not elsewhere classified (R26.2) Pain - Right/Left: Right Pain - part of body: Knee     Time: 0263-7858 PT Time Calculation (min) (ACUTE ONLY): 15 min  Charges:  $Gait Training: 8-22 mins                     Jamesetta Geralds, PT, DPT [facility] Rehabilitation Department Office: [phone] Pager: [phone]   Jamesetta Geralds 06/26/2022, 5:06 PM

## 2022-06-26 NOTE — TOC Initial Note (Signed)
Transition of Care [facility]) - Initial/Assessment Note    Patient Details  Name: Ryan Glass MRN: [id number] Date of Birth: 09/09/1956  [facility] CM/SW Contact:    Lennart Pall, LCSW Phone Number: 06/26/2022, 2:23 PM  Clinical Narrative:                 Met with pt today to review dc planning needs. Pt confirms he was active with [facility] from last hospital dc and that he has all needed DME.  His primary concern at this time is his decreased mobility and need to negotiate several stairs at home (of note, he was able to ambulate up/down 10 steps with supervision at last dc).  Pt certainly prefers dc home but is considering need for SNF and would like to see how he does with therapy tomorrow. I will follow up tomorrow and will assist with whichever dc plan is decided upon.  Expected Discharge Plan: [place] (vs. SNF) Barriers to Discharge: Continued Medical Work up   Patient Goals and CMS Choice Patient states their goals for this hospitalization and ongoing recovery are:: prefers to dc home but concerned about mobility in the home and especially stairs      Expected Discharge Plan and Services Expected Discharge Plan: [place] (vs. SNF) In-house Referral: Clinical Social Work     Living arrangements for the past 2 months: Single Family Home                 DME Arranged: N/A DME Agency: NA                  Prior Living Arrangements/Services Living arrangements for the past 2 months: [place] Lives with:: Spouse Patient language and need for interpreter reviewed:: Yes Do you feel safe going back to the place where you live?: Yes      Need for Family Participation in Patient Care: Yes (Comment) Care giver support system in place?: Yes (comment) Current home services: Home PT (open to [facility]) Criminal Activity/Legal Involvement Pertinent to Current Situation/Hospitalization: No - Comment as needed  Activities of  Daily Living Home Assistive Devices/Equipment: Cane (specify quad or straight), Walker (specify type), Eyeglasses ADL Screening (condition at time of admission) Patient's cognitive ability adequate to safely complete daily activities?: Yes Is the patient deaf or have difficulty hearing?: No Does the patient have difficulty seeing, even when wearing glasses/contacts?: No Does the patient have difficulty concentrating, remembering, or making decisions?: No Patient able to express need for assistance with ADLs?: Yes Does the patient have difficulty dressing or bathing?: No Independently performs ADLs?: No Communication: Independent Dressing (OT): Independent Grooming: Independent Feeding: Independent Bathing: Independent Toileting: Needs assistance Is this a change from baseline?: Pre-admission baseline In/Out Bed: Needs assistance Is this a change from baseline?: Pre-admission baseline Walks in Home: Needs assistance Is this a change from baseline?: Pre-admission baseline Does the patient have difficulty walking or climbing stairs?: Yes Weakness of Legs: Both (right knee replacement on 06/18/22, left knee replacement on 04/30/22) Weakness of Arms/Hands: None  Permission Sought/Granted Permission sought to share information with : Family Supports Permission granted to share information with : Yes, Verbal Permission Granted  Share Information with NAME: Anguel Delapena     Permission granted to share info w Relationship: wife  Permission granted to share info w Contact Information: [phone]  Emotional Assessment Appearance:: Appears stated age Attitude/Demeanor/Rapport: Gracious, Engaged Affect (typically observed): Accepting Orientation: : Oriented  to Self, Oriented to Place, Oriented to  Time, Oriented to Situation Alcohol / Substance Use: Not Applicable Psych Involvement: No (comment)  Admission diagnosis:  Bacteremia [R78.81] Elevated troponin [R77.8] Acute respiratory  failure with hypoxia (HCC) [J96.01] Sepsis, due to unspecified organism, unspecified whether acute organ dysfunction present [facility]) [A41.9] Patient Active Problem List   Diagnosis Date Noted   Sepsis ([place]) 06/22/2022   Bacteremia 06/21/2022   Arthritis of right knee 06/18/2022   Osteoarthritis of right knee 06/15/2022   S/P total knee arthroplasty, left 04/30/2022   Degenerative arthritis of left knee 04/26/2022   Pre-operative respiratory examination 04/05/2022   Dyspnea on exertion 01/23/2022   Elevated troponin 01/23/2022   Gastrointestinal hemorrhage 01/23/2022   PFO (patent foramen ovale) 12/28/2021   Asthma 12/26/2021   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   CAP (community acquired pneumonia) 10/13/2021   Dyspnea    Acute respiratory failure with hypoxia ([place]) 10/07/2021   ABLA (acute blood loss anemia) 10/07/2021   History of pulmonary embolus ([place]) 10/07/2021   AF (paroxysmal atrial fibrillation) ([place]) 10/07/2021   Hypertrophic cardiomyopathy ([place]) 08/15/2021   Ventricular tachycardia ([place]) 08/02/2021   Ventricular tachycardia, unspecified ([place]) 08/02/2021   Acute on chronic respiratory failure ([place]) 06/07/2021   History of COVID-19 06/07/2021   Rheumatoid arthritis involving multiple sites on Humira ([place]) 06/07/2021   On chronic prednisone therapy for Rheumatoid Arthritis 06/07/2021   CAD (coronary artery disease), non-obstructive 05/30/2021   Precordial chest pain    Heart failure with mid-range ejection fraction ([place]) 05/27/2021   LVH (left ventricular hypertrophy) due to hypertensive disease, with heart failure ([place]) 05/27/2021   Class 2 severe obesity with serious comorbidity and body mass index (BMI) of 35.0 to 35.9 in adult [facility]) 05/26/2021   Pulmonary embolism ([place]) 05/25/2021   Thoracic aortic aneurysm without rupture ([place]) 02/01/2021   Ascending aortic aneurysm ([place]) 02/01/2021   Other hyperlipidemia 10/14/2020   Testicular swelling 05/14/2019   Right knee  pain 01/29/2019   PVC (premature ventricular contraction) 10/07/2018   Ventricular premature depolarization 10/07/2018   Dilated aortic root (HCC)    Allergic rhinitis 01/10/2016   Chronic gout without tophus 01/10/2016   Drug therapy 01/10/2016   OSA (obstructive sleep apnea) 01/10/2016   Colonic polyp 12/14/2015   Rt groin pain 12/14/2015   Scoliosis of thoracolumbar spine 12/14/2015   Right lower quadrant abdominal pain 04/20/2015   Arthritis of knee 02/22/2015   Edema of extremities 02/22/2015   Essential hypertension 02/22/2015   Preventative health care 02/22/2015   Bradycardia by electrocardiogram 04/16/2014   PCP:  [name], MD Pharmacy:   [facility] [id number] - [place], [place] [address] [place] [address] Phone: [phone] Fax: [phone]  [facility] ([facility] ) - [place], [place] [address] Phone: [phone] Fax: [place] [address] [place] [address] Phone: [phone] Fax: [phone]     Social Determinants of Health (SDOH) Interventions    Readmission Risk Interventions    06/26/2022    2:18 PM 10/12/2021    1:08 PM  Readmission Risk Prevention Plan  Transportation Screening Complete Complete  PCP or Specialist Appt within 3-5 Days Complete   HRI or [place] Complete   Social Work Consult for [place] Planning/Counseling Complete   Palliative Care Screening Not Applicable   Medication Review (  RN Care Manager) Complete Complete  PCP or Specialist appointment within 3-5 days of discharge  Complete  HRI or [place]  Complete  SW Recovery Care/Counseling Consult  Complete  [place]  Not Applicable

## 2022-06-26 NOTE — Progress Notes (Signed)
Occupational Therapy Treatment Patient Details Name: Ryan Glass MRN: [id number] DOB: February 01, 1956 Today's Date: 06/26/2022   History of present illness 66 yo male admitted with bacteremia, gout flare, sepsis. Recent hx of R TKA 06/18/22. Hx of L TKA, AICD, Afib, CHF, gout, CKD, CAD, OSA, hernia repair, RA   OT comments  Patient was educated on using AE for LB Dressing tasks while seated. Patient verbalized and demonstrated understanding. Patient was provided with hand out on total hip kit to speak with wife about these devices tonight. Patient continues to have increased pain in RLE impacting participation in session. Patient's discharge plan remains appropriate at this time. OT will continue to follow acutely.     Recommendations for follow up therapy are one component of a multi-disciplinary discharge planning process, led by the attending physician.  Recommendations may be updated based on patient status, additional functional criteria and insurance authorization.    Follow Up Recommendations  Skilled nursing-short term rehab (<3 hours/day)    Assistance Recommended at Discharge Frequent or constant Supervision/Assistance  Patient can return home with the following  A lot of help with walking and/or transfers;A lot of help with bathing/dressing/bathroom;Assistance with cooking/housework;Assist for transportation;Help with stairs or ramp for entrance   Equipment Recommendations  Other (comment) (total hip kit)    Recommendations for Other Services      Precautions / Restrictions Precautions Precautions: Fall Precaution Comments: Instructed to not adjust knee portion of bed, locked out bed controls for knee. Restrictions Weight Bearing Restrictions: No RLE Weight Bearing: Weight bearing as tolerated Other Position/Activity Restrictions: WBAT       Mobility Bed Mobility                    Transfers                         Balance                                            ADL either performed or assessed with clinical judgement   ADL Overall ADL's : Needs assistance/impaired                       Lower Body Dressing Details (indicate cue type and reason): patient was seated in recliner after PT session. patient was educated on total hip kit and participation in donning/doffing socks with AE. patient was able to demonstrate with mod verbal cues with increased time. patient was able to simulate donning pants with reacher with simulated pants with min A and increased time from feet to knees with education on proper sequencing.                    Extremity/Trunk Assessment              Vision       Perception     Praxis      Cognition Arousal/Alertness: Awake/alert Behavior During Therapy: WFL for tasks assessed/performed Overall Cognitive Status: Within Functional Limits for tasks assessed                                          Exercises      Shoulder Instructions  General Comments      Pertinent Vitals/ Pain       Pain Assessment Pain Assessment: 0-10 Pain Score: 7  Pain Location: R knee Pain Descriptors / Indicators: Aching, Discomfort, Guarding, Grimacing Pain Intervention(s): Limited activity within patient's tolerance, Monitored during session, Repositioned, Ice applied  Home Living                                          Prior Functioning/Environment              Frequency  Min 2X/week        Progress Toward Goals  OT Goals(current goals can now be found in the care plan section)  Progress towards OT goals: Progressing toward goals     Plan Discharge plan remains appropriate    Co-evaluation                 AM-PAC OT "6 Clicks" Daily Activity     Outcome Measure   Help from another person eating meals?: None Help from another person taking care of personal grooming?: None Help from another person  toileting, which includes using toliet, bedpan, or urinal?: A Lot Help from another person bathing (including washing, rinsing, drying)?: A Lot Help from another person to put on and taking off regular upper body clothing?: A Little Help from another person to put on and taking off regular lower body clothing?: A Lot 6 Click Score: 17    End of Session Equipment Utilized During Treatment: Other (comment) (total hip kit)  OT Visit Diagnosis: Unsteadiness on feet (R26.81);Other abnormalities of gait and mobility (R26.89);Muscle weakness (generalized) (M62.81);Pain Pain - Right/Left: Right Pain - part of body: Leg   Activity Tolerance Patient tolerated treatment well   Patient Left in chair;with call bell/phone within reach;with chair alarm set   Nurse Communication Other (comment) (ok to participate in session)        Time: 3912-2583 OT Time Calculation (min): 17 min  Charges: OT General Charges $OT Visit: 1 Visit OT Treatments $Self Care/Home Management : 8-22 mins  Jackelyn Poling OTR/L, MS Acute Rehabilitation Department Office# [phone] Pager# [phone]   Marcellina Millin 06/26/2022, 12:29 PM

## 2022-06-26 NOTE — Progress Notes (Signed)
PATIENT ID: Ryan Glass  MRN: [id number]  DOB/AGE:  [birth year] / 66 y.o.        PROGRESS NOTE Subjective: Patient is alert, oriented, no Nausea, no Vomiting, yes passing gas. Taking PO well. Denies SOB, Chest or Calf Pain. Using Incentive Spirometer, PAS in place. Ambulate 12', Patient reports pain as 4/10 .    Objective: Vital signs in last 24 hours: Vitals:   06/25/22 0417 06/25/22 1436 06/25/22 1940 06/26/22 0511  BP: 122/83 135/87 126/76 (!) 119/92  Pulse: 90 90 89 90  Resp:  19 18 20   Temp: 99 F (37.2 C) 97.7 F (36.5 C) 99.5 F (37.5 C) 98.1 F (36.7 C)  TempSrc: Oral Oral Axillary Oral  SpO2: 97% 98% 94% 93%  Weight: 107.7 kg     Height:          Intake/Output from previous day: I/O last 3 completed shifts: In: 480 [P.O.:480] Out: 2550 [Urine:2550]   Intake/Output this shift: No intake/output data recorded.   LABORATORY DATA: Recent Labs    06/25/22 0403 06/26/22 0431  WBC 6.4 5.9  HGB 9.9* 10.8*  HCT 31.0* 33.5*  PLT 208 249  NA 136 135  K 3.5 4.1  CL 105 106  CO2 23 21*  BUN 24* 28*  CREATININE 0.98 1.00  GLUCOSE 121* 137*  CALCIUM 8.6* 8.9    Examination: Neurologically intact ABD soft Neurovascular intact Sensation intact distally Intact pulses distally Dorsiflexion/Plantar flexion intact Incision: dressing C/D/I No cellulitis present Compartment soft} Left foot is still 2+ swollen but less tender than yesterday.  I think his gout is resolving  Assessment:      1.Continue gradual progress and recovery from acute gout attack that presented as sepsis.  Plan: PT/OT WBAT, AROM and PROM  Continue Eliquis 2.5 mg by mouth twice a day.  Patient should be able to discharge home once cleared by physical therapy for safe ambulation.  He are he has an elevated toilet seat and walker at home.      06/28/22 06/26/2022, 7:52 AM  Patient ID: 06/28/2022, male   DOB: 1956/03/14, 66 y.o.   MRN: [id number]

## 2022-06-26 NOTE — Progress Notes (Signed)
PROGRESS NOTE    Ryan Glass  TKW:[id number] DOB: July 12, 1956 DOA: 06/21/2022 PCP: [name], MD   Brief Narrative:  66 y.o. male with medical history significant of HTN, dilated aortic root, PE, Hypertropic cardiomyopathy s/p ICD, CAD, HFrEF,  RA, recent right total knee arthroplasty on 06/18/2022 was admitted for positive blood cultures.  He had presented to the ED on 06/20/2022 with fever and altered mental status and was discharged home after negative work-up but later on was called for admission for positive blood cultures with gram-positive rods growing in blood cultures.  On presentation, he was afebrile, required supplemental oxygen, had leukocytosis with WBCs of 12.3.  COVID-19 and influenza were negative.  UA was negative.  CTA chest was negative for pulmonary embolism.  Bilateral lower extremity ultrasound was negative for DVT.  He was started on broad-spectrum antibiotics.  Orthopedics was consulted.  Blood cultures finalized as basilar species: Most likely contaminant; repeat blood cultures have remained negative so far.  Antibiotics subsequently were discontinued.  He was started on colchicine by orthopedics for possible gout flare.  He was also given a dose of IV Solu-Medrol on 06/25/2022.  Assessment & Plan:   Bacteremia: Gram-positive rod bacteremia Sepsis: Present on admission History of recent right total knee arthroplasty -Presented with lactic acidosis, leukocytosis, bacteremia with concern for right knee infection -Currently on IV vancomycin, Flagyl and cefepime.  Blood cultures from 06/20/2022 grew Bacillus species.  Possibly contaminant. Repeat blood cultures from 06/21/2022 are negative so far. -Orthopedics following.  Broad-spectrum antibiotics were discontinued on 06/24/2022 after discussion with orthopedics. -Bilateral lower extremity duplex ultrasound was negative for DVT. -Sepsis has resolved -PT eval  Right knee pain -?  Acute gout flare.  Continue colchicine  and allopurinol.  Still complains of knee pain but improving.  Patient received a dose of IV Solu-Medrol 60 mg on 06/25/2022.  We will give another dose of IV Solu-Medrol 60 mg today.  He wants to work with physical therapy today to see how he does.    Leukocytosis -Resolved  Paroxysmal A-fib -Remains on lower dose of Eliquis postoperatively.  Will resume regular dose once cleared by orthopedics.  Hypertrophic cardiomyopathy status post ICD -Echo showed EF of 65 to 70% with grade 1 diastolic dysfunction.  Outpatient follow-up with cardiology.  Elevated troponin -Troponins 103 and 91.  No chest pain.  Possibly demand ischemia.  No further work-up needed.  Echo showed EF of 65 to 70% with grade 1 diastolic dysfunction  Hypokalemia -Improved.  Essential hypertension -Blood pressure stable.  Continue hydralazine, sotalol and spironolactone.  Monitor blood pressure.  Acute respiratory failure with hypoxia -Possibly from bacteremia.  CTA chest negative for PE or infiltrates.  Currently on room air.  Incentive spirometry.  Anemia of chronic disease -From chronic illnesses.  Hemoglobin stable.  Monitor intermittently  Obesity -Outpatient follow-up  Constipation -Continue bowel regimen.     DVT prophylaxis: Eliquis Code Status: Full Family Communication: None at bedside Disposition Plan: Status is: Inpatient Remains inpatient appropriate because: Of severity of illness.  Possible discharge in 1 to 2 days if improves clinically.  Will wait for PT evaluation again today.    Consultants: Orthopedics  Procedures: None  Antimicrobials: Cefepime and vancomycin from 06/21/2022-06/23/2022  Subjective: Patient seen and examined at bedside.  Is feeling slightly better after having IV steroids yesterday.  Still complains of some right knee pain and is hoping to get another dose of IV steroid today.  No fever or vomiting today.  He  is willing to work with physical therapy today to decide if  she will be ready to go home soon.  Objective: Vitals:   06/25/22 0417 06/25/22 1436 06/25/22 1940 06/26/22 0511  BP: 122/83 135/87 126/76 (!) 119/92  Pulse: 90 90 89 90  Resp:  19 18 20   Temp: 99 F (37.2 C) 97.7 F (36.5 C) 99.5 F (37.5 C) 98.1 F (36.7 C)  TempSrc: Oral Oral Axillary Oral  SpO2: 97% 98% 94% 93%  Weight: 107.7 kg     Height:        Intake/Output Summary (Last 24 hours) at 06/26/2022 1050 Last data filed at 06/26/2022 0855 Gross per 24 hour  Intake 240 ml  Output 950 ml  Net -710 ml    Filed Weights   06/22/22 0840 06/24/22 0500 06/25/22 0417  Weight: 104.7 kg 107 kg 107.7 kg    Examination:  General: No distress.  On room air currently. ENT/neck: No obvious neck masses/thyromegaly noted.  JVD is not elevated currently  respiratory: Bilateral decreased breath sounds at bases with some scattered crackles  CVS: Mostly rate controlled; S1-S2 heard  abdominal: Soft, obese, still has some distention; bowel sounds are heard normally  extremities: Right lower extremity edema still present but improving; no cyanosis right knee swelling and slight erythema around incision site  CNS: Alert and awake.   No focal neurologic deficit.  Moving extremities  lymph: No palpable lymph nodes skin: No obvious rashes/ecchymosis  psych: Judgment, mood and affect are normal  musculoskeletal: No obvious other joint swelling/deformity    Data Reviewed: I have personally reviewed following labs and imaging studies  CBC: Recent Labs  Lab 06/22/22 0842 06/23/22 0400 06/24/22 0341 06/25/22 0403 06/26/22 0431  WBC 6.3 7.5 6.4 6.4 5.9  NEUTROABS 5.1 5.9 4.8 4.8 5.1  HGB 10.4* 10.5* 9.8* 9.9* 10.8*  HCT 31.1* 31.9* 30.2* 31.0* 33.5*  MCV 94.2 93.5 94.1 94.5 92.5  PLT 138* 169 178 208 0000000    Basic Metabolic Panel: Recent Labs  Lab 06/22/22 0842 06/23/22 0400 06/24/22 0341 06/25/22 0403 06/26/22 0431  NA 139 138 136 136 135  K 3.6 3.4* 3.5 3.5 4.1  CL 108 105  106 105 106  CO2 23 22 22 23  21*  GLUCOSE 105* 113* 109* 121* 137*  BUN 16 22 21  24* 28*  CREATININE 0.95 1.03 0.96 0.98 1.00  CALCIUM 8.8* 8.8* 8.4* 8.6* 8.9  MG 2.0 2.1 2.2 2.2 2.3    GFR: Estimated Creatinine Clearance: 86.4 mL/min (by C-G formula based on SCr of 1 mg/dL). Liver Function Tests: Recent Labs  Lab 06/20/22 1656 06/21/22 1542 06/23/22 0400  AST 24 30 24   ALT 18 19 22   ALKPHOS 64 76 68  BILITOT 1.1 1.3* 1.0  PROT 6.2* 7.4 6.0*  ALBUMIN 3.6 4.1 3.0*    No results for input(s): "LIPASE", "AMYLASE" in the last 168 hours. No results for input(s): "AMMONIA" in the last 168 hours. Coagulation Profile: Recent Labs  Lab 06/20/22 1656 06/21/22 1542  INR 1.3* 1.1    Cardiac Enzymes: No results for input(s): "CKTOTAL", "CKMB", "CKMBINDEX", "TROPONINI" in the last 168 hours. BNP (last 3 results) Recent Labs    09/19/21 0938 12/05/21 1637  PROBNP 355 183    HbA1C: No results for input(s): "HGBA1C" in the last 72 hours. CBG: No results for input(s): "GLUCAP" in the last 168 hours. Lipid Profile: No results for input(s): "CHOL", "HDL", "LDLCALC", "TRIG", "CHOLHDL", "LDLDIRECT" in the last 72 hours. Thyroid Function  Tests: No results for input(s): "TSH", "T4TOTAL", "FREET4", "T3FREE", "THYROIDAB" in the last 72 hours. Anemia Panel: No results for input(s): "VITAMINB12", "FOLATE", "FERRITIN", "TIBC", "IRON", "RETICCTPCT" in the last 72 hours. Sepsis Labs: Recent Labs  Lab 06/20/22 1656 06/21/22 1542 06/21/22 1748  LATICACIDVEN 1.5 2.4* 1.6     Recent Results (from the past 240 hour(s))  Blood Culture (routine x 2)     Status: Abnormal   Collection Time: 06/20/22  4:15 PM   Specimen: BLOOD  Result Value Ref Range Status   Specimen Description   Final    BLOOD SITE NOT SPECIFIED Performed at [facility] [address], [place], [place] [address]    Special Requests   Final    BOTTLES DRAWN AEROBIC AND ANAEROBIC Blood Culture  adequate volume Performed at [facility] [address], [place], [place] [address]    Culture  Setup Time   Final    GRAM POSITIVE RODS AEROBIC BOTTLE ONLY CRITICAL RESULT CALLED TO, READ BACK BY AND VERIFIED WITH: RN KELLY NEAL 06/21/22@5 :31 BY TW                                                                                                                                                                               Culture (A)  Final    BACILLUS SPECIES Standardized susceptibility testing for this organism is not available. Performed at [facility], [place] [address] [place], [place] [address]    Report Status 06/22/2022 FINAL  Final  Blood Culture (routine x 2)     Status: None   Collection Time: 06/20/22  4:24 PM   Specimen: BLOOD  Result Value Ref Range Status   Specimen Description   Final    BLOOD SITE NOT SPECIFIED Performed at [facility] [address] [place], [place] [address]    Special Requests   Final    BOTTLES DRAWN AEROBIC AND ANAEROBIC Blood Culture adequate volume Performed at [facility] [address], [place], [place] [address]    Culture   Final    NO GROWTH 5 DAYS Performed at [facility], [place] [address] [place], [place] [address]    Report Status 06/25/2022 FINAL  Final  Urine Culture     Status: None   Collection Time: 06/20/22  5:34 PM   Specimen: In/Out Cath Urine  Result Value Ref Range Status   Specimen Description   Final    IN/OUT CATH URINE Performed at [facility] [address], [place], [place] [address]    Special Requests   Final    NONE Performed at [facility], [address] [place], [place] [address]    Culture   Final    NO GROWTH Performed at [facility], [place] [address] [place], [place] [address]    Report Status 06/21/2022 FINAL  Final  Blood Culture (routine x 2)     Status: None (Preliminary  result)   Collection Time: 06/21/22  4:10 PM   Specimen: BLOOD  Result Value Ref Range Status   Specimen Description   Final    BLOOD SITE NOT SPECIFIED Performed at [facility] [address], [place], [place] [address]    Special Requests   Final    BOTTLES DRAWN AEROBIC AND ANAEROBIC Blood Culture adequate volume Performed at [facility] [address] [place], [place] [address]    Culture   Final    NO GROWTH 4 DAYS Performed at [facility], [place] [address], [place], [place] [address]    Report Status PENDING  Incomplete  Resp Panel by RT-PCR (Flu A&B, Covid) Anterior Nasal Swab     Status: None   Collection Time: 06/21/22  5:51 PM   Specimen: Anterior Nasal Swab  Result Value Ref Range Status   SARS Coronavirus 2 by RT PCR NEGATIVE NEGATIVE Final    Comment: (NOTE) SARS-CoV-2 target nucleic acids are NOT DETECTED.  The SARS-CoV-2 RNA is generally detectable in upper respiratory specimens during the acute phase of infection. The lowest concentration of SARS-CoV-2 viral copies this assay can detect is 138 copies/mL. A negative result does not preclude SARS-Cov-2 infection and should not be used as the sole basis for treatment or other patient management decisions. A negative result may occur with  improper specimen collection/handling, submission of specimen other than nasopharyngeal swab, presence of viral mutation(s) within the areas targeted by this assay, and inadequate number of viral copies(<138 copies/mL). A negative result must be combined with clinical observations, patient history, and epidemiological information. The expected result is Negative.  Fact Sheet for Patients:  EntrepreneurPulse.com.au  Fact Sheet for Healthcare Providers:  IncredibleEmployment.be  This test is no t yet approved or cleared by the [place] FDA and  has been authorized for detection and/or  diagnosis of SARS-CoV-2 by FDA under an Emergency Use Authorization (EUA). This EUA will remain  in effect (meaning this test can be used) for the duration of the COVID-19 declaration under Section 564(b)(1) of the Act, 21 U.S.C.section 360bbb-3(b)(1), unless the authorization is terminated  or revoked sooner.       Influenza A by PCR NEGATIVE NEGATIVE Final   Influenza B by PCR NEGATIVE NEGATIVE Final    Comment: (NOTE) The Xpert Xpress SARS-CoV-2/FLU/RSV plus assay is intended as an aid in the diagnosis of influenza from Nasopharyngeal swab specimens and should not be used as a sole basis for treatment. Nasal washings and aspirates are unacceptable for Xpert Xpress SARS-CoV-2/FLU/RSV testing.  Fact Sheet for Patients: EntrepreneurPulse.com.au  Fact Sheet for Healthcare Providers: IncredibleEmployment.be  This test is not yet approved or cleared by the [place] FDA and has been authorized for detection and/or diagnosis of SARS-CoV-2 by FDA under an Emergency Use Authorization (EUA). This EUA will remain in effect (meaning this test can be used) for the duration of the COVID-19 declaration under Section 564(b)(1) of the Act, 21 U.S.C. section 360bbb-3(b)(1), unless the authorization is terminated or revoked.  Performed at [facility], [place] [address], [place], [place] [address]   Urine Culture     Status: None  Collection Time: 06/21/22  6:24 PM   Specimen: In/Out Cath Urine  Result Value Ref Range Status   Specimen Description   Final    IN/OUT CATH URINE Performed at [facility], [address], [place], [place] [address]    Special Requests   Final    NONE Performed at [facility], [address] [place], [place] [address]    Culture   Final    NO GROWTH Performed at [facility], [address], [place], [place] [address]    Report Status 06/22/2022 FINAL   Final         Radiology Studies: No results found.      Scheduled Meds:  allopurinol  100 mg Oral Daily   apixaban  2.5 mg Oral BID   gabapentin  600 mg Oral TID   hydrALAZINE  100 mg Oral TID   loratadine  10 mg Oral Daily   montelukast  10 mg Oral QHS   polyethylene glycol  17 g Oral BID   senna-docusate  2 tablet Oral BID   sotalol  120 mg Oral Q12H   spironolactone  25 mg Oral Daily   Continuous Infusions:          Glade Lloyd, MD [facility] 06/26/2022, 10:50 AM

## 2022-06-27 DIAGNOSIS — R7881 Bacteremia: Secondary | ICD-10-CM | POA: Diagnosis not present

## 2022-06-27 LAB — CBC WITH DIFFERENTIAL/PLATELET
Abs Immature Granulocytes: 0.15 K/uL — ABNORMAL HIGH (ref 0.00–0.07)
Basophils Absolute: 0 K/uL (ref 0.0–0.1)
Basophils Relative: 0 %
Eosinophils Absolute: 0 K/uL (ref 0.0–0.5)
Eosinophils Relative: 0 %
HCT: 36.4 % — ABNORMAL LOW (ref 39.0–52.0)
Hemoglobin: 11.9 g/dL — ABNORMAL LOW (ref 13.0–17.0)
Immature Granulocytes: 1 %
Lymphocytes Relative: 6 %
Lymphs Abs: 1.1 K/uL (ref 0.7–4.0)
MCH: 30.4 pg (ref 26.0–34.0)
MCHC: 32.7 g/dL (ref 30.0–36.0)
MCV: 93.1 fL (ref 80.0–100.0)
Monocytes Absolute: 0.7 K/uL (ref 0.1–1.0)
Monocytes Relative: 4 %
Neutro Abs: 15.7 K/uL — ABNORMAL HIGH (ref 1.7–7.7)
Neutrophils Relative %: 89 %
Platelets: 435 K/uL — ABNORMAL HIGH (ref 150–400)
RBC: 3.91 MIL/uL — ABNORMAL LOW (ref 4.22–5.81)
RDW: 13.7 % (ref 11.5–15.5)
WBC: 17.7 K/uL — ABNORMAL HIGH (ref 4.0–10.5)
nRBC: 0 % (ref 0.0–0.2)

## 2022-06-27 LAB — BASIC METABOLIC PANEL WITH GFR
Anion gap: 12 (ref 5–15)
BUN: 40 mg/dL — ABNORMAL HIGH (ref 8–23)
CO2: 21 mmol/L — ABNORMAL LOW (ref 22–32)
Calcium: 9.5 mg/dL (ref 8.9–10.3)
Chloride: 107 mmol/L (ref 98–111)
Creatinine, Ser: 1.35 mg/dL — ABNORMAL HIGH (ref 0.61–1.24)
GFR, Estimated: 58 mL/min — ABNORMAL LOW
Glucose, Bld: 126 mg/dL — ABNORMAL HIGH (ref 70–99)
Potassium: 3.7 mmol/L (ref 3.5–5.1)
Sodium: 140 mmol/L (ref 135–145)

## 2022-06-27 LAB — C-REACTIVE PROTEIN: CRP: 7.6 mg/dL — ABNORMAL HIGH

## 2022-06-27 LAB — MAGNESIUM: Magnesium: 2.5 mg/dL — ABNORMAL HIGH (ref 1.7–2.4)

## 2022-06-27 MED ORDER — SODIUM CHLORIDE 0.9 % IV SOLN: INTRAVENOUS | Status: AC

## 2022-06-27 MED ORDER — COLCHICINE 0.6 MG PO TABS
0.6000 mg | ORAL_TABLET | Freq: Every day | ORAL | Status: DC
  Administered 2022-06-28: 0.6 mg via ORAL
  Filled 2022-06-27: qty 1

## 2022-06-27 NOTE — TOC Progression Note (Signed)
Transition of Care [facility]) - Progression Note    Patient Details  Name: MARON STANZIONE MRN: [id number] Date of Birth: 1956/04/12  Transition of Care [facility]) CM/SW Contact  Lennart Pall, LCSW Phone Number: 06/27/2022, 10:34 AM  Clinical Narrative:    Met with pt today who is now in agreement with PT recommendation for short term SNF rehab and request [place] area facilities.  Will begin SNF bed search.   Expected Discharge Plan: [facility] (vs. SNF) Barriers to Discharge: Continued Medical Work up  Expected Discharge Plan and Services Expected Discharge Plan: [facility] (vs. SNF) In-house Referral: Clinical Social Work     Living arrangements for the past 2 months: Single Family Home                 DME Arranged: N/A DME Agency: NA                   Social Determinants of Health (SDOH) Interventions    Readmission Risk Interventions    06/26/2022    2:18 PM 10/12/2021    1:08 PM  Readmission Risk Prevention Plan  Transportation Screening Complete Complete  PCP or Specialist Appt within 3-5 Days Complete   HRI or [place] Complete   Social Work Consult for [place] Planning/Counseling Complete   Palliative Care Screening Not Applicable   Medication Review [employer]) Complete Complete  PCP or Specialist appointment within 3-5 days of discharge  Complete  HRI or [place]  Complete  SW Recovery Care/Counseling Consult  Complete  [place]  Not Applicable

## 2022-06-27 NOTE — Progress Notes (Signed)
PATIENT ID: Ryan Glass  MRN: [id number]  DOB/AGE:  09-07-1956 / 66 y.o.        PROGRESS NOTE Subjective: Patient is alert, oriented, no Nausea, no Vomiting, yes passing gas. Taking PO well. Denies SOB, Chest Pain. Using [employer]. Ambulate WBAT with pt practicing steps, Patient reports pain as 4-5/10 .    Objective: Vital signs in last 24 hours: Vitals:   06/26/22 1225 06/26/22 2015 06/27/22 0500 06/27/22 0500  BP: 113/73 118/86  (!) 114/92  Pulse: 90 90  90  Resp: (!) 22 20  20   Temp: 98.2 F (36.8 C) 97.7 F (36.5 C)  97.7 F (36.5 C)  TempSrc: Oral Oral  Oral  SpO2: 92% 90%  94%  Weight:   104.5 kg   Height:          Intake/Output from previous day: I/O last 3 completed shifts: In: 320 [P.O.:320] Out: 2070 [Urine:2070]   Intake/Output this shift: No intake/output data recorded.   LABORATORY DATA: Recent Labs    06/26/22 0431 06/27/22 0438  WBC 5.9 17.7*  HGB 10.8* 11.9*  HCT 33.5* 36.4*  PLT 249 435*  NA 135 140  K 4.1 3.7  CL 106 107  CO2 21* 21*  BUN 28* 40*  CREATININE 1.00 1.35*  GLUCOSE 137* 126*  CALCIUM 8.9 9.5    Examination: Neurologically intact Neurovascular intact Sensation intact distally Intact pulses distally Dorsiflexion/Plantar flexion intact Incision: no drainage and mild bruising mid incision No cellulitis present Compartment soft} The calf and foot continue to remain swollen with 2+ edema but it seems to be diminishing  Assessment:   1.Continue gradual progress and recovery from acute gout attack that presented as sepsis  Plan: PT/OT WBAT, AROM and PROM  DVT Prophylaxis:  SCDx72hrs, Eliquis 2.5 mg BID x 2 weeks DISCHARGE PLAN: Home vs SNF DISCHARGE NEEDS: HHPT     06/29/22 06/27/2022, 8:28 AM

## 2022-06-27 NOTE — Telephone Encounter (Signed)
Epic reviewed awaiting SNF rehab placement stable labs/exam today.

## 2022-06-27 NOTE — Progress Notes (Signed)
PROGRESS NOTE    EZEQUIEL MACKOWIAK  [id number] DOB: 16-Jul-1956 DOA: 06/21/2022 PCP: [name], MD   Brief Narrative:  66 y.o. male with medical history significant of HTN, dilated aortic root, PE, Hypertropic cardiomyopathy s/p ICD, CAD, HFrEF,  RA, recent right total knee arthroplasty on 06/18/2022 was admitted for positive blood cultures.  He had presented to the ED on 06/20/2022 with fever and altered mental status and was discharged home after negative work-up but later on was called for admission for positive blood cultures with gram-positive rods growing in blood cultures.  On presentation, he was afebrile, required supplemental oxygen, had leukocytosis with WBCs of 12.3.  COVID-19 and influenza were negative.  UA was negative.  CTA chest was negative for pulmonary embolism.  Bilateral lower extremity ultrasound was negative for DVT.  He was started on broad-spectrum antibiotics.  Orthopedics was consulted.  Blood cultures finalized as basilar species: Most likely contaminant; repeat blood cultures have remained negative so far.  Antibiotics subsequently were discontinued.  He was started on colchicine by orthopedics for possible gout flare.  He was also given a dose of IV Solu-Medrol on 06/25/2022.  Assessment & Plan:   Bacteremia/contamination: Gram-positive rod bacteremia Sepsis: Present on admission History of recent right total knee arthroplasty -Presented with lactic acidosis, leukocytosis, bacteremia with concern for right knee infection Was initially on IV vancomycin, Flagyl and cefepime.  Blood cultures from 06/20/2022 grew Bacillus species.  Possibly contaminant. Repeat blood cultures from 06/21/2022 are negative so far. -Orthopedics following.  Broad-spectrum antibiotics were discontinued on 06/24/2022 after discussion with orthopedics. -Bilateral lower extremity duplex ultrasound was negative for DVT. -PT//OT initially recommended home with home health but he did not do well on  the evening of 06/26/2022 and now they are recommending SNF.  TOC has been made aware.  Waiting for placement.  Right knee pain -?  Acute gout flare.  Has received intermittent steroids.  Has been started on allopurinol daily but as needed colchicine.  We will start him on daily scheduled colchicine.  Pain is improving.   Patient received a dose of IV Solu-Medrol 60 mg on 06/25/2022 as well as on 06/26/2022.  No further indication of Solu-Medrol.  Patient does appear to have some erythema at right lower extremity, wondering if this is infection.  His leukocytosis also is worse but that can be due to Solu-Medrol.  We will watch him off of antibiotics, reassess clinically tomorrow and repeat CBC.  Paroxysmal A-fib -Remains on lower dose of Eliquis postoperatively.  Will resume regular dose once cleared by orthopedics.  Hypertrophic cardiomyopathy status post ICD -Echo showed EF of 65 to 70% with grade 1 diastolic dysfunction.  Outpatient follow-up with cardiology.  Elevated troponin -Troponins 103 and 91.  No chest pain.  Possibly demand ischemia.  No further work-up needed.  Echo showed EF of 65 to 70% with grade 1 diastolic dysfunction  Hypokalemia -Improved.  Essential hypertension -Blood pressure stable.  Continue hydralazine, sotalol and spironolactone.  Monitor blood pressure.  Acute respiratory failure with hypoxia -Possibly from bacteremia.  CTA chest negative for PE or infiltrates.  Currently on room air.  Incentive spirometry.  Anemia of chronic disease -From chronic illnesses.  Hemoglobin stable.  Monitor intermittently  Obesity -Outpatient follow-up  Constipation -Continue bowel regimen.     DVT prophylaxis: Eliquis Code Status: Full Family Communication: None at bedside Disposition Plan: Status is: Inpatient Remains inpatient appropriate because: Medically stable, pending SNF placement.  Consultants: Orthopedics  Procedures: None  Antimicrobials: Cefepime and  vancomycin from 06/21/2022-06/23/2022  Subjective: Patient seen and examined.  Complains of some right knee pain but improving.  No other complaint.  He remains concerned due to edema in the right lower extremity.    Objective: Vitals:   06/26/22 1225 06/26/22 2015 06/27/22 0500 06/27/22 0500  BP: 113/73 118/86  (!) 114/92  Pulse: 90 90  90  Resp: (!) 22 20  20   Temp: 98.2 F (36.8 C) 97.7 F (36.5 C)  97.7 F (36.5 C)  TempSrc: Oral Oral  Oral  SpO2: 92% 90%  94%  Weight:   104.5 kg   Height:        Intake/Output Summary (Last 24 hours) at 06/27/2022 1131 Last data filed at 06/27/2022 0600 Gross per 24 hour  Intake 200 ml  Output 1370 ml  Net -1170 ml    Filed Weights   06/24/22 0500 06/25/22 0417 06/27/22 0500  Weight: 107 kg 107.7 kg 104.5 kg    Examination:  General exam: Appears calm and comfortable  Respiratory system: Clear to auscultation. Respiratory effort normal. Cardiovascular system: S1 & S2 heard, RRR. No JVD, murmurs, rubs, gallops or clicks.  +1-2 pitting edema right lower extremity.  No edema in the left lower extremity. Gastrointestinal system: Abdomen is nondistended, soft and nontender. No organomegaly or masses felt. Normal bowel sounds heard. Central nervous system: Alert and oriented. No focal neurological deficits. Extremities: Symmetric 5 x 5 power. Skin: Mild edema in the right lower extremity. Psychiatry: Judgement and insight appear normal. Mood & affect appropriate.   Data Reviewed: I have personally reviewed following labs and imaging studies  CBC: Recent Labs  Lab 06/23/22 0400 06/24/22 0341 06/25/22 0403 06/26/22 0431 06/27/22 0438  WBC 7.5 6.4 6.4 5.9 17.7*  NEUTROABS 5.9 4.8 4.8 5.1 15.7*  HGB 10.5* 9.8* 9.9* 10.8* 11.9*  HCT 31.9* 30.2* 31.0* 33.5* 36.4*  MCV 93.5 94.1 94.5 92.5 93.1  PLT 169 178 208 249 435*    Basic Metabolic Panel: Recent Labs  Lab 06/23/22 0400 06/24/22 0341 06/25/22 0403 06/26/22 0431  06/27/22 0438  NA 138 136 136 135 140  K 3.4* 3.5 3.5 4.1 3.7  CL 105 106 105 106 107  CO2 22 22 23  21* 21*  GLUCOSE 113* 109* 121* 137* 126*  BUN 22 21 24* 28* 40*  CREATININE 1.03 0.96 0.98 1.00 1.35*  CALCIUM 8.8* 8.4* 8.6* 8.9 9.5  MG 2.1 2.2 2.2 2.3 2.5*    GFR: Estimated Creatinine Clearance: 63 mL/min (A) (by C-G formula based on SCr of 1.35 mg/dL (H)). Liver Function Tests: Recent Labs  Lab 06/20/22 1656 06/21/22 1542 06/23/22 0400  AST 24 30 24   ALT 18 19 22   ALKPHOS 64 76 68  BILITOT 1.1 1.3* 1.0  PROT 6.2* 7.4 6.0*  ALBUMIN 3.6 4.1 3.0*    No results for input(s): "LIPASE", "AMYLASE" in the last 168 hours. No results for input(s): "AMMONIA" in the last 168 hours. Coagulation Profile: Recent Labs  Lab 06/20/22 1656 06/21/22 1542  INR 1.3* 1.1    Cardiac Enzymes: No results for input(s): "CKTOTAL", "CKMB", "CKMBINDEX", "TROPONINI" in the last 168 hours. BNP (last 3 results) Recent Labs    09/19/21 0938 12/05/21 1637  PROBNP 355 183    HbA1C: No results for input(s): "HGBA1C" in the last 72 hours. CBG: No results for input(s): "GLUCAP" in the last 168 hours. Lipid Profile: No results for input(s): "CHOL", "HDL", "LDLCALC", "TRIG", "CHOLHDL", "LDLDIRECT" in the last 72 hours.  Thyroid Function Tests: No results for input(s): "TSH", "T4TOTAL", "FREET4", "T3FREE", "THYROIDAB" in the last 72 hours. Anemia Panel: No results for input(s): "VITAMINB12", "FOLATE", "FERRITIN", "TIBC", "IRON", "RETICCTPCT" in the last 72 hours. Sepsis Labs: Recent Labs  Lab 06/20/22 1656 06/21/22 1542 06/21/22 1748  LATICACIDVEN 1.5 2.4* 1.6     Recent Results (from the past 240 hour(s))  Blood Culture (routine x 2)     Status: Abnormal   Collection Time: 06/20/22  4:15 PM   Specimen: BLOOD  Result Value Ref Range Status   Specimen Description   Final    BLOOD SITE NOT SPECIFIED Performed at [facility], [address], [place], [place]  [address]    Special Requests   Final    BOTTLES DRAWN AEROBIC AND ANAEROBIC Blood Culture adequate volume Performed at [facility], [address], [place], [place] [address]    Culture  Setup Time   Final    GRAM POSITIVE RODS AEROBIC BOTTLE ONLY CRITICAL RESULT CALLED TO, READ BACK BY AND VERIFIED WITH: RN KELLY NEAL 06/21/22@5 :31 BY TW                                                                                                                                                                               Culture (A)  Final    BACILLUS SPECIES Standardized susceptibility testing for this organism is not available. Performed at [facility], [address], [place], [place] [address]    Report Status 06/22/2022 FINAL  Final  Blood Culture (routine x 2)     Status: None   Collection Time: 06/20/22  4:24 PM   Specimen: BLOOD  Result Value Ref Range Status   Specimen Description   Final    BLOOD SITE NOT SPECIFIED Performed at [facility], [address] [place], [place] [address]    Special Requests   Final    BOTTLES DRAWN AEROBIC AND ANAEROBIC Blood Culture adequate volume Performed at [facility], [address], [place], [place] [address]    Culture   Final    NO GROWTH 5 DAYS Performed at [facility], [address] [place], [place] [address]    Report Status 06/25/2022 FINAL  Final  Urine Culture     Status: None   Collection Time: 06/20/22  5:34 PM   Specimen: In/Out Cath Urine  Result Value Ref Range Status   Specimen Description   Final    IN/OUT CATH URINE Performed at [facility], [address] [place], [place] [address]    Special Requests   Final    NONE Performed at [facility], [address] [place], [place] [address]    Culture   Final    NO GROWTH Performed at [facility], [address], [place], [place] [address]    Report  Status 06/21/2022 FINAL  Final  Blood Culture (routine x 2)     Status: None   Collection Time: 06/21/22  4:10 PM   Specimen: BLOOD  Result Value Ref Range Status   Specimen Description   Final    BLOOD SITE NOT SPECIFIED Performed at [facility], [address], [place], [place] [address]    Special Requests   Final    BOTTLES DRAWN AEROBIC AND ANAEROBIC Blood Culture adequate volume Performed at [facility], [address], [place], [place] [address]    Culture   Final    NO GROWTH 5 DAYS Performed at [facility], [address] [place], [place] [address]    Report Status 06/26/2022 FINAL  Final  Resp Panel by RT-PCR (Flu A&B, Covid) Anterior Nasal Swab     Status: None   Collection Time: 06/21/22  5:51 PM   Specimen: Anterior Nasal Swab  Result Value Ref Range Status   SARS Coronavirus 2 by RT PCR NEGATIVE NEGATIVE Final    Comment: (NOTE) SARS-CoV-2 target nucleic acids are NOT DETECTED.  The SARS-CoV-2 RNA is generally detectable in upper respiratory specimens during the acute phase of infection. The lowest concentration of SARS-CoV-2 viral copies this assay can detect is 138 copies/mL. A negative result does not preclude SARS-Cov-2 infection and should not be used as the sole basis for treatment or other patient management decisions. A negative result may occur with  improper specimen collection/handling, submission of specimen other than nasopharyngeal swab, presence of viral mutation(s) within the areas targeted by this assay, and inadequate number of viral copies(<138 copies/mL). A negative result must be combined with clinical observations, patient history, and epidemiological information. The expected result is Negative.  Fact Sheet for Patients:  BloggerCourse.com  Fact Sheet for Healthcare Providers:  SeriousBroker.it  This test is no t yet approved or cleared by  the [place] FDA and  has been authorized for detection and/or diagnosis of SARS-CoV-2 by FDA under an Emergency Use Authorization (EUA). This EUA will remain  in effect (meaning this test can be used) for the duration of the COVID-19 declaration under Section 564(b)(1) of the Act, 21 U.S.C.section 360bbb-3(b)(1), unless the authorization is terminated  or revoked sooner.       Influenza A by PCR NEGATIVE NEGATIVE Final   Influenza B by PCR NEGATIVE NEGATIVE Final    Comment: (NOTE) The Xpert Xpress SARS-CoV-2/FLU/RSV plus assay is intended as an aid in the diagnosis of influenza from Nasopharyngeal swab specimens and should not be used as a sole basis for treatment. Nasal washings and aspirates are unacceptable for Xpert Xpress SARS-CoV-2/FLU/RSV testing.  Fact Sheet for Patients: BloggerCourse.com  Fact Sheet for Healthcare Providers: SeriousBroker.it  This test is not yet approved or cleared by the [place] FDA and has been authorized for detection and/or diagnosis of SARS-CoV-2 by FDA under an Emergency Use Authorization (EUA). This EUA will remain in effect (meaning this test can be used) for the duration of the COVID-19 declaration under Section 564(b)(1) of the Act, 21 U.S.C. section 360bbb-3(b)(1), unless the authorization is terminated or revoked.  Performed at [facility], [address], [place], [place] [address]   Urine Culture     Status: None  Collection Time: 06/21/22  6:24 PM   Specimen: In/Out Cath Urine  Result Value Ref Range Status   Specimen Description   Final    IN/OUT CATH URINE Performed at [facility], [place] [address], [place], [place] [address]    Special Requests   Final    NONE Performed at [facility], [place] [address], [place], [place] [address]    Culture   Final    NO GROWTH Performed at [facility], [place] [address] [place], [place] [address]    Report Status 06/22/2022 FINAL  Final    Radiology Studies:   Scheduled Meds:  allopurinol  100 mg Oral Daily   apixaban  2.5 mg Oral BID   [START ON 06/28/2022] colchicine  0.6 mg Oral Daily   gabapentin  600 mg Oral TID   hydrALAZINE  100 mg Oral TID   loratadine  10 mg Oral Daily   montelukast  10 mg Oral QHS   polyethylene glycol  17 g Oral BID   senna-docusate  2 tablet Oral BID   sotalol  120 mg Oral Q12H   spironolactone  25 mg Oral Daily   Continuous Infusions:  sodium chloride 125 mL/hr at 06/27/22 1017     Darliss Cheney, MD [facility] 06/27/2022, 11:31 AM

## 2022-06-27 NOTE — Progress Notes (Signed)
Physical Therapy Treatment Patient Details Name: Ryan Glass MRN: [id number] DOB: Nov 29, 1956 Today's Date: 06/27/2022   History of Present Illness 66 yo male admitted with bacteremia, gout flare, sepsis. Recent hx of R TKA 06/18/22. Hx of L TKA, AICD, Afib, CHF, gout, CKD, CAD, OSA, hernia repair, RA    PT Comments    Pt is POD9 from R-TKA.  Pt received in recliner very motivated to participate in therapy, reporting he did his exercises three times since previous session yesterday. Pt required supervision only for all mobility tasks today for safety, no physical assist required. Ambulated in hallway 159ft with RW with improved gait. Completed reclined HEP without cuing excepting pt required min assist for heel slides secondary to pain. Pt has shown progress this morning that demonstrates improvement with mobility; main barriers to discharge home include the pt's house has many, many stairs and pt's wife unable to physically assist; pt remains amenable to SNF-level therapies. Will attempt second session today to continue stair training as pt status and PT schedule allows. We will continue to follow acutely.   Recommendations for follow up therapy are one component of a multi-disciplinary discharge planning process, led by the attending physician.  Recommendations may be updated based on patient status, additional functional criteria and insurance authorization.  Follow Up Recommendations  Skilled nursing-short term rehab (<3 hours/day) (vs HHPT depending on progress) Can patient physically be transported by private vehicle: Yes   Assistance Recommended at Discharge Frequent or constant Supervision/Assistance  Patient can return home with the following A little help with walking and/or transfers;A little help with bathing/dressing/bathroom;Assistance with cooking/housework;Assist for transportation;Help with stairs or ramp for entrance   Equipment Recommendations  None recommended by PT     Recommendations for Other Services OT consult     Precautions / Restrictions Precautions Precautions: Fall Restrictions Weight Bearing Restrictions: No RLE Weight Bearing: Weight bearing as tolerated Other Position/Activity Restrictions: WBAT     Mobility  Bed Mobility               General bed mobility comments: Pt in recliner at entry and exit    Transfers Overall transfer level: Needs assistance Equipment used: Rolling walker (2 wheels) Transfers: Sit to/from Stand Sit to Stand: Supervision           General transfer comment: For safety only    Ambulation/Gait Ambulation/Gait assistance: Supervision Gait Distance (Feet): 100 Feet Assistive device: Rolling walker (2 wheels) Gait Pattern/deviations: Step-to pattern, Antalgic, Decreased dorsiflexion - right, Decreased weight shift to right Gait velocity: decreased     General Gait Details: Pt ambulated 138ft  with RW sueprvision only no physical assist required or overt LOB noted. Demonstrated improved weightbearing on RLE and increased gait, slightly m ore reciprocal gait pattern.   Stairs             Wheelchair Mobility    Modified Rankin (Stroke Patients Only)       Balance Overall balance assessment: Needs assistance Sitting-balance support: Feet supported, No upper extremity supported Sitting balance-Leahy Scale: Good     Standing balance support: Reliant on assistive device for balance, During functional activity, Bilateral upper extremity supported Standing balance-Leahy Scale: Poor                              Cognition Arousal/Alertness: Awake/alert Behavior During Therapy: WFL for tasks assessed/performed Overall Cognitive Status: Within Functional Limits for tasks assessed  General Comments: Very pleasant and motivated        Exercises Total Joint Exercises Ankle Circles/Pumps: AROM, Both, 10 reps, Supine Quad  Sets: AROM, Both, 10 reps, Supine Heel Slides: AROM, Right, 10 reps, Supine Hip ABduction/ADduction: AROM, Right, 10 reps, Supine Straight Leg Raises: AROM, 10 reps, Right, Supine Goniometric ROM: 0-90    General Comments        Pertinent Vitals/Pain Pain Assessment Pain Assessment: 0-10 Pain Score: 6  Pain Location: R knee Pain Descriptors / Indicators: Aching, Discomfort, Guarding, Grimacing Pain Intervention(s): Monitored during session, Repositioned, Premedicated before session    Home Living                          Prior Function            PT Goals (current goals can now be found in the care plan section) Acute Rehab PT Goals Patient Stated Goal: less pain PT Goal Formulation: With patient Time For Goal Achievement: 07/08/22 Potential to Achieve Goals: Good Progress towards PT goals: Progressing toward goals    Frequency    Min 5X/week      PT Plan Discharge plan needs to be updated    Co-evaluation              AM-PAC PT "6 Clicks" Mobility   Outcome Measure  Help needed turning from your back to your side while in a flat bed without using bedrails?: A Little Help needed moving from lying on your back to sitting on the side of a flat bed without using bedrails?: A Little Help needed moving to and from a bed to a chair (including a wheelchair)?: A Little Help needed standing up from a chair using your arms (e.g., wheelchair or bedside chair)?: A Little Help needed to walk in hospital room?: A Lot Help needed climbing 3-5 steps with a railing? : A Lot 6 Click Score: 16    End of Session Equipment Utilized During Treatment: Gait belt Activity Tolerance: Patient tolerated treatment well;No increased pain Patient left: with call bell/phone within reach;in chair;with chair alarm set Nurse Communication: Mobility status PT Visit Diagnosis: Pain;Difficulty in walking, not elsewhere classified (R26.2) Pain - Right/Left: Right Pain - part  of body: Knee     Time: 0102-7253 PT Time Calculation (min) (ACUTE ONLY): 21 min  Charges:  $Gait Training: 8-22 mins                      Jamesetta Geralds, PT, DPT [facility] Department Office: [phone] Pager: [phone] Jamesetta Geralds 06/27/2022, 1:34 PM

## 2022-06-27 NOTE — Progress Notes (Signed)
Physical Therapy Treatment Patient Details Name: Ryan Glass MRN: [id number] DOB: [birth year] Today's Date: 06/27/2022   History of Present Illness 66 yo male admitted with bacteremia, gout flare, sepsis. Recent hx of R TKA 06/18/22. Hx of L TKA, AICD, Afib, CHF, gout, CKD, CAD, OSA, hernia repair, RA    PT Comments    Pt seen for second session POD9, purpose of which was stair training with single crutch and one railing. Pt supervision for transfers and min guard for stair training, pt completed flight (10) steps ascending and descending without LOB; pt reporting SOB and 8/10 post-training. Pt continues to progress mobility and improves with each therapy session. Pt's wife can only provide supervision PRN with no physical assistance at home; pt is amenable to short-term stay with SNF-level therapies to optimize level of function prior to return home. Discharge destination remains HHPT vs SNF depending on bed availability, we will continue to follow acutely.    Recommendations for follow up therapy are one component of a multi-disciplinary discharge planning process, led by the attending physician.  Recommendations may be updated based on patient status, additional functional criteria and insurance authorization.  Follow Up Recommendations  Skilled nursing-short term rehab (<3 hours/day) (vs HHPT depending on progress) Can patient physically be transported by private vehicle: Yes   Assistance Recommended at Discharge Frequent or constant Supervision/Assistance  Patient can return home with the following A little help with walking and/or transfers;A little help with bathing/dressing/bathroom;Assistance with cooking/housework;Assist for transportation;Help with stairs or ramp for entrance   Equipment Recommendations  None recommended by PT    Recommendations for Other Services OT consult     Precautions / Restrictions Precautions Precautions: Fall Restrictions Weight Bearing  Restrictions: No RLE Weight Bearing: Weight bearing as tolerated Other Position/Activity Restrictions: WBAT     Mobility  Bed Mobility               General bed mobility comments: Pt in recliner at entry and exit    Transfers Overall transfer level: Needs assistance Equipment used: Rolling walker (2 wheels) Transfers: Sit to/from Stand Sit to Stand: Supervision   Step pivot transfers: Supervision       General transfer comment: For safety only    Ambulation/Gait Ambulation/Gait assistance: Supervision Gait Distance (Feet): 100 Feet Assistive device: Rolling walker (2 wheels) Gait Pattern/deviations: Step-to pattern, Antalgic, Decreased dorsiflexion - right, Decreased weight shift to right Gait velocity: decreased     General Gait Details: Pt ambulated 138ft  with RW sueprvision only no physical assist required or overt LOB noted. Demonstrated improved weightbearing on RLE and increased gait, slightly m ore reciprocal gait pattern.   Stairs Stairs: Yes Stairs assistance: Min guard, +2 safety/equipment Stair Management: Step to pattern, One rail Right, Forwards, With crutches Number of Stairs: 10 General stair comments: Pt educated on stair mobility utilizing one crutch and one railing, verbalized understanding. Pt completed flight (10) stairs with min guard, no physical assist or overt LOB with good technique and safe form.   Wheelchair Mobility    Modified Rankin (Stroke Patients Only)       Balance Overall balance assessment: Needs assistance Sitting-balance support: Feet supported, No upper extremity supported Sitting balance-Leahy Scale: Good     Standing balance support: Reliant on assistive device for balance, During functional activity, Bilateral upper extremity supported Standing balance-Leahy Scale: Poor  Cognition Arousal/Alertness: Awake/alert Behavior During Therapy: WFL for tasks  assessed/performed Overall Cognitive Status: Within Functional Limits for tasks assessed                                 General Comments: Very pleasant and motivated        Exercises Total Joint Exercises Ankle Circles/Pumps: AROM, Both, 10 reps, Supine Quad Sets: AROM, Both, 10 reps, Supine Heel Slides: AROM, Right, 10 reps, Supine Hip ABduction/ADduction: AROM, Right, 10 reps, Supine Straight Leg Raises: AROM, 10 reps, Right, Supine Goniometric ROM: 0-90    General Comments        Pertinent Vitals/Pain Pain Assessment Pain Assessment: 0-10 Pain Score: 8  Pain Location: R knee Pain Descriptors / Indicators: Aching, Discomfort, Guarding, Grimacing Pain Intervention(s): Monitored during session, Repositioned    Home Living                          Prior Function            PT Goals (current goals can now be found in the care plan section) Acute Rehab PT Goals Patient Stated Goal: less pain PT Goal Formulation: With patient Time For Goal Achievement: 07/08/22 Potential to Achieve Goals: Good Progress towards PT goals: Progressing toward goals    Frequency    Min 5X/week      PT Plan Discharge plan needs to be updated    Co-evaluation              AM-PAC PT "6 Clicks" Mobility   Outcome Measure  Help needed turning from your back to your side while in a flat bed without using bedrails?: A Little Help needed moving from lying on your back to sitting on the side of a flat bed without using bedrails?: A Little Help needed moving to and from a bed to a chair (including a wheelchair)?: A Little Help needed standing up from a chair using your arms (e.g., wheelchair or bedside chair)?: A Little Help needed to walk in hospital room?: A Little Help needed climbing 3-5 steps with a railing? : A Little 6 Click Score: 18    End of Session Equipment Utilized During Treatment: Gait belt Activity Tolerance: Patient tolerated treatment  well Patient left: with call bell/phone within reach;in chair;with chair alarm set Nurse Communication: Mobility status PT Visit Diagnosis: Pain;Difficulty in walking, not elsewhere classified (R26.2) Pain - Right/Left: Right Pain - part of body: Knee     Time: 1308-6578 PT Time Calculation (min) (ACUTE ONLY): 22 min  Charges:  $Gait Training: 8-22 mins                     Jamesetta Geralds, PT, DPT [facility] Department Office: [phone] Pager: [phone]   Jamesetta Geralds 06/27/2022, 5:03 PM

## 2022-06-27 NOTE — NC FL2 (Signed)
[place] MEDICAID FL2 LEVEL OF CARE SCREENING TOOL     IDENTIFICATION  Patient Name: Ryan Glass Birthdate: [birth year] Sex: male Admission Date (Current Location): 06/21/2022  [facility] and [place] Number:  Producer, television/film/video and Address:  [facility],  [address] [place], [place] [address]      Provider Number: [id number]  Attending Physician Name and Address:  Hughie Closs, MD  Relative Name and Phone Number:  [phone]    Current Level of Care: Hospital Recommended Level of Care: Skilled Nursing Facility Prior Approval Number:    Date Approved/Denied:   PASRR Number: [id number]  Discharge Plan: SNF    Current Diagnoses: Patient Active Problem List   Diagnosis Date Noted   Sepsis (HCC) 06/22/2022   Bacteremia 06/21/2022   Arthritis of right knee 06/18/2022   Osteoarthritis of right knee 06/15/2022   S/P total knee arthroplasty, left 04/30/2022   Degenerative arthritis of left knee 04/26/2022   Pre-operative respiratory examination 04/05/2022   Dyspnea on exertion 01/23/2022   Elevated troponin 01/23/2022   Gastrointestinal hemorrhage 01/23/2022   PFO (patent foramen ovale) 12/28/2021   Asthma 12/26/2021   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   CAP (community acquired pneumonia) 10/13/2021   Dyspnea    Acute respiratory failure with hypoxia (HCC) 10/07/2021   ABLA (acute blood loss anemia) 10/07/2021   History of pulmonary embolus (PE) 10/07/2021   AF (paroxysmal atrial fibrillation) (HCC) 10/07/2021   Hypertrophic cardiomyopathy (HCC) 08/15/2021   Ventricular tachycardia (HCC) 08/02/2021   Ventricular tachycardia, unspecified (HCC) 08/02/2021   Acute on chronic respiratory failure (HCC) 06/07/2021   History of COVID-19 06/07/2021   Rheumatoid arthritis involving multiple sites on Humira (HCC) 06/07/2021   On chronic prednisone therapy for Rheumatoid Arthritis 06/07/2021   CAD (coronary artery disease), non-obstructive 05/30/2021    Precordial chest pain    Heart failure with mid-range ejection fraction (HCC) 05/27/2021   LVH (left ventricular hypertrophy) due to hypertensive disease, with heart failure (HCC) 05/27/2021   Class 2 severe obesity with serious comorbidity and body mass index (BMI) of 35.0 to 35.9 in adult (HCC) 05/26/2021   Pulmonary embolism (HCC) 05/25/2021   Thoracic aortic aneurysm without rupture (HCC) 02/01/2021   Ascending aortic aneurysm (HCC) 02/01/2021   Other hyperlipidemia 10/14/2020   Testicular swelling 05/14/2019   Right knee pain 01/29/2019   PVC (premature ventricular contraction) 10/07/2018   Ventricular premature depolarization 10/07/2018   Dilated aortic root (HCC)    Allergic rhinitis 01/10/2016   Chronic gout without tophus 01/10/2016   Drug therapy 01/10/2016   OSA (obstructive sleep apnea) 01/10/2016   Colonic polyp 12/14/2015   Rt groin pain 12/14/2015   Scoliosis of thoracolumbar spine 12/14/2015   Right lower quadrant abdominal pain 04/20/2015   Arthritis of knee 02/22/2015   Edema of extremities 02/22/2015   Essential hypertension 02/22/2015   Preventative health care 02/22/2015   Bradycardia by electrocardiogram 04/16/2014    Orientation RESPIRATION BLADDER Height & Weight     Self, Time, Situation, Place  Normal Continent Weight: 230 lb 6.1 oz (104.5 kg) Height:  5\' 8"  (172.7 cm)  BEHAVIORAL SYMPTOMS/MOOD NEUROLOGICAL BOWEL NUTRITION STATUS      Continent    AMBULATORY STATUS COMMUNICATION OF NEEDS Skin   Limited Assist Verbally Other (Comment) (surgical incision only)                       Personal Care Assistance Level of Assistance  Bathing, Dressing Bathing Assistance: Limited  assistance   Dressing Assistance: Limited assistance     Functional Limitations Info             SPECIAL CARE FACTORS FREQUENCY  PT (By licensed PT), OT (By licensed OT)     PT Frequency: 5x/wk OT Frequency: 5x/wk            Contractures Contractures Info:  Not present    Additional Factors Info  Code Status, Allergies Code Status Info: Full Allergies Info: Amlodipine Besylate, Aspirin           Current Medications (06/27/2022):  This is the current hospital active medication list Current Facility-Administered Medications  Medication Dose Route Frequency Provider Last Rate Last Admin   0.9 %  sodium chloride infusion   Intravenous Continuous Hughie Closs, MD 125 mL/hr at 06/27/22 1017 New Bag at 06/27/22 1017   allopurinol (ZYLOPRIM) tablet 100 mg  100 mg Oral Daily Hanley Ben, Kshitiz, MD   100 mg at 06/27/22 0830   apixaban (ELIQUIS) tablet 2.5 mg  2.5 mg Oral BID Allena Katz, PA-C   2.5 mg at 06/27/22 0831   bisacodyl (DULCOLAX) suppository 10 mg  10 mg Rectal Daily PRN Gean Birchwood, MD       Melene Muller ON 06/28/2022] colchicine tablet 0.6 mg  0.6 mg Oral Daily Pahwani, Daleen Bo, MD       gabapentin (NEURONTIN) capsule 600 mg  600 mg Oral TID Glade Lloyd, MD   600 mg at 06/27/22 0830   hydrALAZINE (APRESOLINE) tablet 100 mg  100 mg Oral TID Glade Lloyd, MD   100 mg at 06/27/22 0831   loratadine (CLARITIN) tablet 10 mg  10 mg Oral Daily Glade Lloyd, MD   10 mg at 06/27/22 0831   methocarbamol (ROBAXIN) tablet 500 mg  500 mg Oral Q6H PRN Glade Lloyd, MD   500 mg at 06/22/22 1554   montelukast (SINGULAIR) tablet 10 mg  10 mg Oral QHS Alekh, Kshitiz, MD   10 mg at 06/26/22 2132   morphine (PF) 2 MG/ML injection 2 mg  2 mg Intravenous Q2H PRN Glade Lloyd, MD   2 mg at 06/24/22 0848   oxyCODONE (Oxy IR/ROXICODONE) immediate release tablet 5-10 mg  5-10 mg Oral QID PRN Glade Lloyd, MD   10 mg at 06/27/22 0450   polyethylene glycol (MIRALAX / GLYCOLAX) packet 17 g  17 g Oral BID Glade Lloyd, MD   17 g at 06/26/22 2133   senna-docusate (Senokot-S) tablet 2 tablet  2 tablet Oral BID Glade Lloyd, MD   2 tablet at 06/27/22 0831   sodium phosphate (FLEET) 7-19 GM/118ML enema 1 enema  1 enema Rectal Daily PRN Gean Birchwood, MD   1 enema  at 06/24/22 1105   sotalol (BETAPACE) tablet 120 mg  120 mg Oral Q12H Hanley Ben, Kshitiz, MD   120 mg at 06/27/22 0835   spironolactone (ALDACTONE) tablet 25 mg  25 mg Oral Daily Glade Lloyd, MD   25 mg at 06/27/22 0830     Discharge Medications: Please see discharge summary for a list of discharge medications.  Relevant Imaging Results:  Relevant Lab Results:   Additional Information SS# [id number]  Amada Jupiter, LCSW

## 2022-06-28 ENCOUNTER — Other Ambulatory Visit (HOSPITAL_COMMUNITY): Payer: Self-pay

## 2022-06-28 DIAGNOSIS — R7881 Bacteremia: Secondary | ICD-10-CM | POA: Diagnosis not present

## 2022-06-28 LAB — CBC WITH DIFFERENTIAL/PLATELET
Abs Immature Granulocytes: 0.09 10*3/uL — ABNORMAL HIGH (ref 0.00–0.07)
Basophils Absolute: 0 10*3/uL (ref 0.0–0.1)
Basophils Relative: 0 %
Eosinophils Absolute: 0 10*3/uL (ref 0.0–0.5)
Eosinophils Relative: 0 %
HCT: 33.3 % — ABNORMAL LOW (ref 39.0–52.0)
Hemoglobin: 10.9 g/dL — ABNORMAL LOW (ref 13.0–17.0)
Immature Granulocytes: 1 %
Lymphocytes Relative: 14 %
Lymphs Abs: 1.5 10*3/uL (ref 0.7–4.0)
MCH: 30.4 pg (ref 26.0–34.0)
MCHC: 32.7 g/dL (ref 30.0–36.0)
MCV: 93 fL (ref 80.0–100.0)
Monocytes Absolute: 0.7 10*3/uL (ref 0.1–1.0)
Monocytes Relative: 7 %
Neutro Abs: 8.3 10*3/uL — ABNORMAL HIGH (ref 1.7–7.7)
Neutrophils Relative %: 78 %
Platelets: 306 10*3/uL (ref 150–400)
RBC: 3.58 MIL/uL — ABNORMAL LOW (ref 4.22–5.81)
RDW: 13.9 % (ref 11.5–15.5)
WBC: 10.6 10*3/uL — ABNORMAL HIGH (ref 4.0–10.5)
nRBC: 0 % (ref 0.0–0.2)

## 2022-06-28 LAB — BASIC METABOLIC PANEL
Anion gap: 9 (ref 5–15)
BUN: 34 mg/dL — ABNORMAL HIGH (ref 8–23)
CO2: 19 mmol/L — ABNORMAL LOW (ref 22–32)
Calcium: 8.8 mg/dL — ABNORMAL LOW (ref 8.9–10.3)
Chloride: 108 mmol/L (ref 98–111)
Creatinine, Ser: 1.21 mg/dL (ref 0.61–1.24)
GFR, Estimated: >60 mL/min (ref >60)
Glucose, Bld: 101 mg/dL — ABNORMAL HIGH (ref 70–99)
Potassium: 3.5 mmol/L (ref 3.5–5.1)
Sodium: 136 mmol/L (ref 135–145)

## 2022-06-28 MED ORDER — COLCHICINE 0.6 MG PO TABS
0.6000 mg | ORAL_TABLET | Freq: Every day | ORAL | 0 refills | Status: DC
Start: 2022-06-29 — End: 2022-10-11
  Filled 2022-06-28: qty 10, 10d supply, fill #0

## 2022-06-28 NOTE — Telephone Encounter (Signed)
Patient contacted via telephone discharged to home today with home health.  Patient reported foot more swollen and painful tonight.  Unsure if hospital staff gave him his prednisone dose today oral 5mg .  Patient asked if he should take the 15mg  per rheumatology prn flare then decrease to 5mg  after flare ceases since he is still having a flare or drop down to the 5mg  po daily with his allopurinol/colchicine.  Discussed with patient solumedrol IV 7/18 half life 36 hours per epocrates so some of that medication still circulating in his system e.g. 30-40mg  since had 60mg  2 days in a row.  AVS reviewed and it does not state when his next dose of 5mg  prednisone due only colchicine starts tomorrow.  Discussed with patient large dose of prednisone prior to bedtime may interrupt sleep.  He is going to take 5mg  tonight prednisone then 15mg  tomorrow earlier in the day if still having gout flare symptoms.  Patient reported PT saw him today and social worker was unable to find rehab facility that accepted his insurance in local area with bed available so he was discharged to home with home health.  Patient stated was able to get to his recliner tonight.  Has urinal. Foot still swollen.  Patient reported walked in hallway with PT today but no stairs until he arrived home.   Discussed with patient to avoid dehydration as will worsen gout symptoms.  PT coming tomorrow 0845 in his home.  Patient sounds tired.  Hasn't slept well in the hospital frequent awakenings for treatments/evaluations.  A&Ox3 spoke full sentences without difficulty  Discussed with patient I would check on him again tomorrow via telephone.  Patient verbalized understanding information/instructions, agreed with plan of care and had no further questions at this time.

## 2022-06-28 NOTE — Progress Notes (Signed)
Pt. Discharge via wheelchair accompanied by wife. Pt. Is alert and oriented, no distress. Discharge instruction and education discussed patient verbalized understanding. All patient belongings are with the family.

## 2022-06-28 NOTE — Telephone Encounter (Signed)
AVS instructions included on discharge  Follow up with PCP in 1-2 weeks Please obtain BMP/CBC in one week Follow-up with orthopedics within 2 weeks Please follow up with your PCP on the following pending results: Unresulted Labs (From admission, onward)      None     Reviewed hospital discharge instructions with patient again today 07/08/22.  He stated PCM will not allow you to come into office if still testing positive for covid.  He plans to test spouse and him today again and if negative will call to schedule appt with [facility] [name].  Reminded patient BMET and CBC labs recommended this week.  Discharge date was 06/28/22.  He has follow up appts with orthopedics and pulmonology on Thursday scheduled. Patient reported occasional cough and runny nose when eating still.  Patient asking if this is still covid symptoms discussed that it could be a mix of covid/allergies and if hot/cold foods can elicit runny nose also.  Gout better and trying not to take second pain medication at 12 hour point and get back down to his baseline for pain medication.  A&Ox3 spoke full sentences without difficulty no cough/congestion/throat clearing audible during 5 minute call.  Patient reported spouse Ander Slade is doing well and denied concerns or questions.  Patient plans to try walking outside in the mornings this week as cooler temperatures 60s and no rain predicted.   Patient verbalized understanding information/instructions, agreed with plan of care and had no further questions at this time.

## 2022-06-28 NOTE — Discharge Summary (Signed)
[facility] Discharge Summary  Ryan Glass [id number] DOB: 07/15/1956 DOA: 06/21/2022  PCP: [name], MD  Admit date: 06/21/2022 Discharge date: 06/28/2022 30 Day Unplanned Readmission Risk Score    Flowsheet Row ED to Hosp-Admission (Current) from 06/21/2022 in [place]  30 Day Unplanned Readmission Risk Score (%) 26.2 Filed at 06/28/2022 0801       This score is the patient's risk of an unplanned readmission within 30 days of being discharged (0 -100%). The score is based on dignosis, age, lab data, medications, orders, and past utilization.   Low:  0-14.9   Medium: 15-21.9   High: 22-29.9   Extreme: 30 and above          Admitted From: Home Disposition: Home  Recommendations for Outpatient Follow-up:  Follow up with PCP in 1-2 weeks Please obtain BMP/CBC in one week Follow-up with orthopedics within 2 weeks Please follow up with your PCP on the following pending results: Unresulted Labs (From admission, onward)    None         Home Health: Yes Equipment/Devices: None  Discharge Condition: Stable CODE STATUS: Full code Diet recommendation: Cardiac  Subjective: Seen and examined.  Right knee pain is improving.  He was informed that none of the SNF around area is excepting his insurance.  He then requested to discharge home.  He was reassessed by PT today.  Brief/Interim Summary: 66 y.o. male with medical history significant of HTN, dilated aortic root, PE, Hypertropic cardiomyopathy s/p ICD, CAD, HFrEF,  RA, recent right total knee arthroplasty on 06/18/2022 was admitted for positive blood cultures.  He had presented to the ED on 06/20/2022 with fever and altered mental status and was discharged home after negative work-up but later on was called for admission for positive blood cultures with gram-positive rods growing in blood cultures.  On presentation, he was afebrile, required supplemental oxygen, had  leukocytosis with WBCs of 12.3.  COVID-19 and influenza were negative.  UA was negative.  CTA chest was negative for pulmonary embolism.  Bilateral lower extremity ultrasound was negative for DVT.  He was started on broad-spectrum antibiotics.  Orthopedics was consulted.    Bacteremia/contamination: Gram-positive rod bacteremia Sepsis ruled out. History of recent right total knee arthroplasty -Presented with lactic acidosis, leukocytosis, bacteremia with concern for right knee infection Was initially on IV vancomycin, Flagyl and cefepime.  Blood cultures from 06/20/2022 grew Bacillus species.  Possibly contaminant. Repeat blood cultures from 06/21/2022 are negative so far. -Orthopedics consulted.  Broad-spectrum antibiotics were discontinued on 06/24/2022 after discussion with orthopedics. -Bilateral lower extremity duplex ultrasound was negative for DVT. -PT//OT initially recommended home with home health but he did not do well on the evening of 06/26/2022 and now they are recommending SNF.  TOC has been made aware.  Waiting for placement.   Right knee pain -?  Acute gout flare.  Has received intermittent steroids.  Has been started on allopurinol daily but as needed colchicine.  I have started him on colchicine scheduled daily and will be discharged on this. Pain is improving.   Patient received a dose of IV Solu-Medrol 60 mg on 06/25/2022 as well as on 06/26/2022.  No further indication of Solu-Medrol.  Patient did appear to have some erythema on the right lower extremity yesterday which has now resolved without any antibiotics.  He still has dependent +2 pitting edema right lower extremity.  He has been advised to keep right lower extremity elevated to  help with edema.  He understands that.   Paroxysmal A-fib -Remains on lower dose of Eliquis postoperatively.  Will resume regular dose once cleared by orthopedics.  Per orthopedics, they usually resume full dose of anticoagulation after 4 weeks of  surgery, will defer to them as he is going to follow-up with them as outpatient.   Hypertrophic cardiomyopathy status post ICD -Echo showed EF of 65 to 70% with grade 1 diastolic dysfunction.  Outpatient follow-up with cardiology.   Elevated troponin -Troponins 103 and 91.  No chest pain.  Possibly demand ischemia.  No further work-up needed.  Echo showed EF of 65 to 70% with grade 1 diastolic dysfunction   Hypokalemia -Improved.   Essential hypertension -Blood pressure stable.  Resume home medications.  Acute respiratory failure with hypoxia -Possibly from bacteremia.  CTA chest negative for PE or infiltrates.  Currently on room air.    Anemia of chronic disease -From chronic illnesses.  Hemoglobin stable.  Monitor intermittently   Obesity -Outpatient follow-up.  Weight loss encouraged.  Dietary counseling provided.  Discharge plan was discussed with patient and/or family member and they verbalized understanding and agreed with it.  Discharge Diagnoses:  Principal Problem:   Bacteremia Active Problems:   Essential hypertension   Right knee pain   Elevated troponin   Acute on chronic respiratory failure (HCC)   Hypertrophic cardiomyopathy (HCC)   AF (paroxysmal atrial fibrillation) ([place])   Sepsis ([place])    Discharge Instructions   Allergies as of 06/28/2022       Reactions   Amlodipine Besylate Swelling, Other (See Comments)   Leg swelling with 10 mg daily am dosing; decreased with BID 5 mg dosing   Aspirin Itching        Medication List     STOP taking these medications    tiZANidine 2 MG tablet Commonly known as: ZANAFLEX       TAKE these medications    albuterol 108 (90 Base) MCG/ACT inhaler Commonly known as: VENTOLIN HFA Inhale 2 puffs into the lungs every 4 (four) hours as needed for wheezing or shortness of breath.   allopurinol 100 MG tablet Commonly known as: ZYLOPRIM Take 100 mg by mouth daily.   Eliquis 5 MG Tabs tablet Generic drug:  apixaban Take 5 mg by mouth 2 (two) times daily.   apixaban 2.5 MG Tabs tablet Commonly known as: Eliquis Take 1 tablet (2.5 mg total) by mouth 2 (two) times daily. Restart normal 5 mg dose after 2 weeks   atorvastatin 40 MG tablet Commonly known as: LIPITOR Take 1 tablet (40 mg total) by mouth daily.   colchicine 0.6 MG tablet Take 1 tablet (0.6 mg total) by mouth daily for 10 days. Start taking on: June 29, 2022 What changed:  when to take this reasons to take this   docusate sodium 100 MG capsule Commonly known as: COLACE Take 1 capsule (100 mg total) by mouth every 12 (twelve) hours.   EX-LAX PO Take 1 tablet by mouth daily as needed (constipation).   ferrous sulfate 325 (65 FE) MG tablet Take 325 mg by mouth daily.   fluticasone 50 MCG/ACT nasal spray Commonly known as: FLONASE Place 1 spray into both nostrils daily. What changed:  when to take this reasons to take this   folic acid 1 MG tablet Commonly known as: FOLVITE Take 1 mg by mouth daily.   furosemide 40 MG tablet Commonly known as: LASIX TAKE ONE TABLET BY MOUTH DAILY FOR THREE DAYS **THEN  TAKE AS NEEDED FOR INCREASE WEIGHT GAIN** What changed: See the new instructions.   gabapentin 600 MG tablet Commonly known as: NEURONTIN Take 600 mg by mouth 3 (three) times daily.   hydrALAZINE 100 MG tablet Commonly known as: APRESOLINE Take 100 mg by mouth 3 (three) times daily.   loratadine 10 MG tablet Commonly known as: CLARITIN Take 10 mg by mouth daily.   Magnesium Oxide 400 MG Caps Take 1 capsule (400 mg total) by mouth every other day. While taking daily multivitamin with 100mg  magnesium every day   montelukast 10 MG tablet Commonly known as: SINGULAIR Take 1 tablet (10 mg total) by mouth at bedtime.   multivitamin with minerals Tabs tablet Take 1 tablet by mouth daily.   Orencia ClickJect 0000000 MG/ML Soaj Generic drug: Abatacept Inject 125 mg into the skin every Sunday.   oxyCODONE 5 MG  immediate release tablet Commonly known as: Oxy IR/ROXICODONE Take 1 tablet (5 mg total) by mouth 4 (four) times daily as needed for moderate pain. What changed: Another medication with the same name was removed. Continue taking this medication, and follow the directions you see here.   polyethylene glycol 17 g packet Commonly known as: MiraLax Take 17 g by mouth 2 (two) times daily.   potassium chloride SA 20 MEQ tablet Commonly known as: KLOR-CON M Take 1 tablet by  mouth only when you have to use a Lasix What changed:  how much to take how to take this when to take this reasons to take this additional instructions   predniSONE 5 MG tablet Commonly known as: DELTASONE Take 5 mg by mouth daily with breakfast.   sotalol 120 MG tablet Commonly known as: BETAPACE Take 1 tablet (120 mg total) by mouth every 12 (twelve) hours.   spironolactone 25 MG tablet Commonly known as: ALDACTONE TAKE ONE TABLET BY MOUTH DAILY   Vitamin D (Ergocalciferol) 1.25 MG (50000 UNIT) Caps capsule Commonly known as: DRISDOL TAKE ONE CAPSULE BY MOUTH ONCE WEEKLY What changed: when to take this        Follow-up Information     Aguiar, [name], MD Follow up in 1 week(s).   Specialty: Family Medicine Contact information: [address] [place] [address] [phone]         Turner, Traci R, MD .   Specialty: Cardiology Contact information: [address] [place] [address] [phone]         Taylor, Gregg W, MD .   Specialty: Cardiology Contact information: [address] [place] [address] [phone]                Allergies  Allergen Reactions   Amlodipine Besylate Swelling and Other (See Comments)    Leg swelling with 10 mg daily am dosing; decreased with BID 5 mg dosing   Aspirin Itching    Consultations: Orthopedics   Procedures/Studies: ECHOCARDIOGRAM COMPLETE  Result Date: 06/23/2022     ECHOCARDIOGRAM REPORT   Patient Name:   Royalty S Levert Date of Exam: 06/23/2022 Medical Rec #:  [id number]      Height:       68.0 in Accession #:    [id number]     Weight:       23 0.8 lb Date of Birth:  24-Feb-1956       BSA:          2.172 m Patient Age:    12 years  BP:           151/74 mmHg Patient Gender: M              HR:           95 bpm. Exam Location:  Inpatient Procedure: 2D Echo, Cardiac Doppler and Color Doppler Indications:    Bacteremia R78.81  History:        Patient has prior history of Echocardiogram examinations, most                 recent 08/03/2021. CAD; Signs/Symptoms:Dyspnea.  Sonographer:    Louie Boston RDCS Referring Phys: [id number] CHING T TU IMPRESSIONS  1. There is a highly mobile density that appears attached to the RV device lead concerning for possible vegetation vs thrombus (clips 2, 12). Recommend TEE for further evaluation.  2. Left ventricular ejection fraction, by estimation, is 65 to 70%. The left ventricle has normal function. The left ventricle has no regional wall motion abnormalities. There is moderate concentric left ventricular hypertrophy. Left ventricular diastolic parameters are consistent with Grade I diastolic dysfunction (impaired relaxation).  3. Right ventricular systolic function is normal. The right ventricular size is mildly enlarged. There is normal pulmonary artery systolic pressure. The estimated right ventricular systolic pressure is 15.8 mmHg.  4. Left atrial size was mildly dilated.  5. A small pericardial effusion is present. The pericardial effusion is posterior and lateral to the left ventricle.  6. The mitral valve is grossly normal. Trivial mitral valve regurgitation.  7. The aortic valve is tricuspid. There is mild calcification of the aortic valve. There is mild thickening of the aortic valve. Aortic valve regurgitation is trivial. Aortic valve sclerosis/calcification is present, without any evidence of aortic stenosis.  8. Aortic dilatation noted.  There is moderate dilatation of the aortic root, measuring 47 mm. There is moderate dilatation of the ascending aorta, measuring 47 mm.  9. The inferior vena cava is normal in size with greater than 50% respiratory variability, suggesting right atrial pressure of 3 mmHg. Comparison(s): Compared to prior TTE on 08/03/21, there is now a mobile density visualized on the device lead as detailed above. FINDINGS  Left Ventricle: Left ventricular ejection fraction, by estimation, is 65 to 70%. The left ventricle has normal function. The left ventricle has no regional wall motion abnormalities. The left ventricular internal cavity size was normal in size. There is  moderate concentric left ventricular hypertrophy. Left ventricular diastolic parameters are consistent with Grade I diastolic dysfunction (impaired relaxation). Right Ventricle: There is a highly mobile density that appears attached to the RV device lead concerning for possible vegetation vs thrombus. Recommend TEE for further evaluation. The right ventricular size is mildly enlarged. No increase in right ventricular wall thickness. Right ventricular systolic function is normal. There is normal pulmonary artery systolic pressure. The tricuspid regurgitant velocity is 1.79 m/s, and with an assumed right atrial pressure of 3 mmHg, the estimated right ventricular systolic pressure is 15.8 mmHg. Left Atrium: Left atrial size was mildly dilated. Right Atrium: Right atrial size was not well visualized. Pericardium: A small pericardial effusion is present. The pericardial effusion is posterior and lateral to the left ventricle. Mitral Valve: The mitral valve is grossly normal. There is mild thickening of the mitral valve leaflet(s). There is mild calcification of the mitral valve leaflet(s). Mild mitral annular calcification. Trivial mitral valve regurgitation. Tricuspid Valve: The tricuspid valve is normal in structure. Tricuspid valve regurgitation is trivial. Aortic  Valve: The aortic valve is  tricuspid. There is mild calcification of the aortic valve. There is mild thickening of the aortic valve. Aortic valve regurgitation is trivial. Aortic valve sclerosis/calcification is present, without any evidence of aortic stenosis. Pulmonic Valve: The pulmonic valve was not well visualized. Pulmonic valve regurgitation is trivial. Aorta: Aortic dilatation noted. There is moderate dilatation of the aortic root, measuring 47 mm. There is moderate dilatation of the ascending aorta, measuring 47 mm. Venous: The inferior vena cava is normal in size with greater than 50% respiratory variability, suggesting right atrial pressure of 3 mmHg. IAS/Shunts: The interatrial septum was not well visualized. Additional Comments: A device lead is visualized.  LEFT VENTRICLE PLAX 2D LVIDd:         4.70 cm LVIDs:         2.60 cm LV PW:         1.60 cm LV IVS:        1.60 cm LVOT diam:     2.60 cm LV SV:         74 LV SV Index:   34 LVOT Area:     5.31 cm  RIGHT VENTRICLE             IVC RV S prime:     22.90 cm/s  IVC diam: 2.20 cm TAPSE (M-mode): 3.4 cm LEFT ATRIUM           Index        RIGHT ATRIUM           Index LA diam:      3.90 cm 1.80 cm/m   RA Area:     27.70 cm LA Vol (A4C): 63.3 ml 29.14 ml/m  RA Volume:   80.60 ml  37.11 ml/m  AORTIC VALVE LVOT Vmax:   91.30 cm/s LVOT Vmean:  59.100 cm/s LVOT VTI:    0.140 m  AORTA Ao Root diam: 4.70 cm Ao Asc diam:  4.70 cm Ao Desc diam: 2.40 cm MITRAL VALVE               TRICUSPID VALVE MV Area (PHT): 4.36 cm    TR Peak grad:   12.8 mmHg MV Decel Time: 174 msec    TR Vmax:        179.00 cm/s MV E velocity: 53.60 cm/s MV A velocity: 86.40 cm/s  SHUNTS MV E/A ratio:  0.62        Systemic VTI:  0.14 m                            Systemic Diam: 2.60 cm Gwyndolyn Kaufman MD Electronically signed by Gwyndolyn Kaufman MD Signature Date/Time: 06/23/2022/1:03:37 PM    Final    VAS [place] LOWER EXTREMITY VENOUS (DVT) (7a-7p)  Result Date: 06/23/2022  Lower Venous  DVT Study Patient Name:  JOSGAR SIXKILLER  Date of Exam:   06/21/2022 Medical Rec #: [id number]       Accession #:    [id number] Date of Birth: January 09, 1956        Patient Gender: M Patient Age:   66 years Exam Location:  [facility] Procedure:      VAS [place] LOWER EXTREMITY VENOUS (DVT) Referring Phys: JULIE HAVILAND --------------------------------------------------------------------------------  Indications: Pain. Other Indications: S/p right total knee replacement. Comparison Study: No previous exam noted. Performing Technologist: Bobetta Lime BS, RVT  Examination Guidelines: A complete evaluation includes B-mode imaging, spectral Doppler, color Doppler, and power Doppler as needed of all  accessible portions of each vessel. Bilateral testing is considered an integral part of a complete examination. Limited examinations for reoccurring indications may be performed as noted. The reflux portion of the exam is performed with the patient in reverse Trendelenburg.  +---------+---------------+---------+-----------+----------+-------------------+ RIGHT    CompressibilityPhasicitySpontaneityPropertiesThrombus Aging      +---------+---------------+---------+-----------+----------+-------------------+ CFV      Full           Yes      Yes                                      +---------+---------------+---------+-----------+----------+-------------------+ SFJ      Full                                                             +---------+---------------+---------+-----------+----------+-------------------+ FV Prox  Full                                                             +---------+---------------+---------+-----------+----------+-------------------+ FV Mid   Full                                                             +---------+---------------+---------+-----------+----------+-------------------+ FV DistalFull                                                              +---------+---------------+---------+-----------+----------+-------------------+ PFV      Full                                                             +---------+---------------+---------+-----------+----------+-------------------+ POP      Full                                                             +---------+---------------+---------+-----------+----------+-------------------+ PTV                                                   Not well visualized +---------+---------------+---------+-----------+----------+-------------------+ PERO  Not well visualized +---------+---------------+---------+-----------+----------+-------------------+   Right Technical Findings: Unable to visualize the right calf veins well, cannot rule out DVT.  +----+---------------+---------+-----------+----------+--------------+ LEFTCompressibilityPhasicitySpontaneityPropertiesThrombus Aging +----+---------------+---------+-----------+----------+--------------+ CFV Full           Yes      Yes                                 +----+---------------+---------+-----------+----------+--------------+     Summary: RIGHT: - No evidence of deep vein thrombosis in the lower extremity. No indirect evidence of obstruction proximal to the inguinal ligament. - No cystic structure found in the popliteal fossa. - Normal right where visualized well. Hematoma in the right popliteal fossa, s/p right total knee replacement.  LEFT: - No evidence of common femoral vein obstruction.  *See table(s) above for measurements and observations. Electronically signed by Harold Barban MD on 06/23/2022 at 12:48:18 AM.    Final    CT Angio Chest PE W and/or Wo Contrast  Result Date: 06/21/2022 CLINICAL DATA:  High probability for pulmonary embolism. Recent knee replacement. Shortness of breath. EXAM: CT ANGIOGRAPHY CHEST WITH CONTRAST TECHNIQUE: Multidetector CT imaging of the  chest was performed using the standard protocol during bolus administration of intravenous contrast. Multiplanar CT image reconstructions and MIPs were obtained to evaluate the vascular anatomy. RADIATION DOSE REDUCTION: This exam was performed according to the departmental dose-optimization program which includes automated exposure control, adjustment of the mA and/or kV according to patient size and/or use of iterative reconstruction technique. CONTRAST:  181mL OMNIPAQUE IOHEXOL 350 MG/ML SOLN COMPARISON:  Perfusion lung scan 02/06/2022. CT angiogram chest 12/13/2021. FINDINGS: Cardiovascular: Satisfactory opacification of the pulmonary arteries to the segmental level. No evidence of pulmonary embolism. Heart is enlarged. There are atherosclerotic calcifications of the aorta. Left-sided dual lead pacemaker is present. No pericardial effusion. Mediastinum/Nodes: No enlarged mediastinal, hilar, or axillary lymph nodes. Thyroid gland, trachea, and esophagus demonstrate no significant findings. Lungs/Pleura: There is minimal atelectasis in the left lung base. The lungs are otherwise clear. No pleural effusion or pneumothorax identified. Upper Abdomen: No acute abnormality. Musculoskeletal: No acute fractures. Review of the MIP images confirms the above findings. IMPRESSION: 1. No evidence for pulmonary embolism. 2. Cardiomegaly. 3. Minimal left base atelectasis/scarring. Electronically Signed   By: Ronney Asters M.D.   On: 06/21/2022 18:09   DG Chest Port 1 View  Result Date: 06/21/2022 CLINICAL DATA:  Possible sepsis.  Positive blood cultures. EXAM: PORTABLE CHEST 1 VIEW COMPARISON:  06/20/2022 FINDINGS: Patient has LEFT-sided transvenous pacemaker with leads overlying the RIGHT atrium and RIGHT ventricle. Stable cardiomegaly. Lungs are clear. No edema. IMPRESSION: Stable cardiomegaly.  No evidence for acute pulmonary abnormality. Electronically Signed   By: Nolon Nations M.D.   On: 06/21/2022 16:42   DG  Knee Complete 4 Views Right  Result Date: 06/20/2022 CLINICAL DATA:  Postoperative fever. Status post right knee arthroplasty 2 days ago. EXAM: RIGHT KNEE - COMPLETE 4+ VIEW COMPARISON:  Right knee radiographs 02/02/2022 FINDINGS: Status post total right knee arthroplasty. Tiny ossific densities just anterior to the proximal tibia likely reflect normal postsurgical change. Expected postoperative changes including moderate joint effusion and mild intra-articular air. No perihardware lucency to indicate hardware failure or loosening. No acute fracture or dislocation. IMPRESSION: 1. Status post total right knee arthroplasty without evidence of hardware failure. 2. Moderate joint effusion and small amount of intra-articular air within normal limits given recent surgery. Cannot exclude a gas forming organism within  the joint fluid given indication of postoperative fever. Electronically Signed   By: Yvonne Kendall M.D.   On: 06/20/2022 18:19   DG Chest Port 1 View  Result Date: 06/20/2022 CLINICAL DATA:  Questionable sepsis, evaluate for abnormality in a 65 year old male. EXAM: PORTABLE CHEST 1 VIEW COMPARISON:  February 06, 2022. FINDINGS: EKG leads project over the chest. LEFT-sided dual lead cardiac pacer defibrillator with power pack over the LEFT chest as before. Cardiomediastinal contours are stable with signs of cardiac enlargement and aortic tortuosity. Lungs are clear. No sign of pneumothorax. No sign of pleural effusion on frontal radiograph. On limited assessment there is no acute skeletal finding. IMPRESSION: Cardiomegaly without acute process. Electronically Signed   By: Zetta Bills M.D.   On: 06/20/2022 18:03     Discharge Exam: Vitals:   06/27/22 2010 06/28/22 0429  BP: 113/81 (!) 129/92  Pulse: 89 90  Resp: 20 20  Temp: 98 F (36.7 C) 98.6 F (37 C)  SpO2: 94% 97%   Vitals:   06/27/22 0500 06/27/22 1338 06/27/22 2010 06/28/22 0429  BP: (!) 114/92 120/81 113/81 (!) 129/92  Pulse: 90  90 89 90  Resp: 20 19 20 20   Temp: 97.7 F (36.5 C) 97.9 F (36.6 C) 98 F (36.7 C) 98.6 F (37 C)  TempSrc: Oral Oral Oral Oral  SpO2: 94% 94% 94% 97%  Weight:      Height:        General: Pt is alert, awake, not in acute distress Cardiovascular: RRR, S1/S2 +, no rubs, no gallops Respiratory: CTA bilaterally, no wheezing, no rhonchi Abdominal: Soft, NT, ND, bowel sounds + Extremities: +2 pitting edema right lower extremity, no signs of infection.    The results of significant diagnostics from this hospitalization (including imaging, microbiology, ancillary and laboratory) are listed below for reference.     Microbiology: Recent Results (from the past 240 hour(s))  Blood Culture (routine x 2)     Status: Abnormal   Collection Time: 06/20/22  4:15 PM   Specimen: BLOOD  Result Value Ref Range Status   Specimen Description   Final    BLOOD SITE NOT SPECIFIED Performed at [facility] [address] [place], [place] [address]    Special Requests   Final    BOTTLES DRAWN AEROBIC AND ANAEROBIC Blood Culture adequate volume Performed at [facility] [address] [place], [place] [address]    Culture  Setup Time   Final    GRAM POSITIVE RODS AEROBIC BOTTLE ONLY CRITICAL RESULT CALLED TO, READ BACK BY AND VERIFIED WITH: RN KELLY NEAL 06/21/22@5 :31 BY TW  Culture (A)  Final    BACILLUS SPECIES Standardized susceptibility testing for this organism is not available. Performed at [facility], [place] [address], [place], [place] [address]    Report Status 06/22/2022 FINAL  Final  Blood Culture (routine x 2)     Status: None   Collection Time: 06/20/22  4:24 PM   Specimen: BLOOD  Result Value Ref Range Status   Specimen Description   Final    BLOOD SITE NOT  SPECIFIED Performed at [facility] [address], [place], [place] [address]    Special Requests   Final    BOTTLES DRAWN AEROBIC AND ANAEROBIC Blood Culture adequate volume Performed at [facility] [address], [place], [place] [address]    Culture   Final    NO GROWTH 5 DAYS Performed at [facility], [place] [address], [place], [place] [address]    Report Status 06/25/2022 FINAL  Final  Urine Culture     Status: None   Collection Time: 06/20/22  5:34 PM   Specimen: In/Out Cath Urine  Result Value Ref Range Status   Specimen Description   Final    IN/OUT CATH URINE Performed at [facility] [address], [place], [place] [address]    Special Requests   Final    NONE Performed at [facility], [place] [address], [place], [place] [address]    Culture   Final    NO GROWTH Performed at [facility], [place] [address] [place], [place] [address]    Report Status 06/21/2022 FINAL  Final  Blood Culture (routine x 2)     Status: None   Collection Time: 06/21/22  4:10 PM   Specimen: BLOOD  Result Value Ref Range Status   Specimen Description   Final    BLOOD SITE NOT SPECIFIED Performed at [facility] [address], [place], [place] [address]    Special Requests   Final    BOTTLES DRAWN AEROBIC AND ANAEROBIC Blood Culture adequate volume Performed at [facility] [address] [place], [place] [address]    Culture   Final    NO GROWTH 5 DAYS Performed at [facility], [place] [address], [place], [place] [address]    Report Status 06/26/2022 FINAL  Final  Resp Panel by RT-PCR (Flu A&B, Covid) Anterior Nasal Swab     Status: None   Collection Time: 06/21/22  5:51 PM   Specimen: Anterior Nasal Swab  Result Value Ref Range Status   SARS Coronavirus 2 by RT PCR NEGATIVE NEGATIVE Final    Comment: (NOTE) SARS-CoV-2 target nucleic acids are NOT  DETECTED.  The SARS-CoV-2 RNA is generally detectable in upper respiratory specimens during the acute phase of infection. The lowest concentration of SARS-CoV-2 viral copies this assay can detect is 138 copies/mL. A negative result does not preclude SARS-Cov-2 infection and should not be used as the sole basis for treatment or other patient management decisions. A negative result may occur with  improper specimen collection/handling, submission of specimen other than nasopharyngeal swab, presence of viral mutation(s) within the areas targeted by this assay, and inadequate number of viral copies(<138 copies/mL). A negative result must be combined with clinical observations, patient history, and epidemiological information. The expected result is Negative.  Fact Sheet for Patients:  EntrepreneurPulse.com.au  Fact Sheet for Healthcare Providers:  IncredibleEmployment.be  This test is no t yet approved  or cleared by the [place] and  has been authorized for detection and/or diagnosis of SARS-CoV-2 by FDA under an Emergency Use Authorization (EUA). This EUA will remain  in effect (meaning this test can be used) for the duration of the COVID-19 declaration under Section 564(b)(1) of the Act, 21 U.S.C.section 360bbb-3(b)(1), unless the authorization is terminated  or revoked sooner.       Influenza A by PCR NEGATIVE NEGATIVE Final   Influenza B by PCR NEGATIVE NEGATIVE Final    Comment: (NOTE) The Xpert Xpress SARS-CoV-2/FLU/RSV plus assay is intended as an aid in the diagnosis of influenza from Nasopharyngeal swab specimens and should not be used as a sole basis for treatment. Nasal washings and aspirates are unacceptable for Xpert Xpress SARS-CoV-2/FLU/RSV testing.  Fact Sheet for Patients: BloggerCourse.com  Fact Sheet for Healthcare Providers: SeriousBroker.it  This test is not yet  approved or cleared by the [place] FDA and has been authorized for detection and/or diagnosis of SARS-CoV-2 by FDA under an Emergency Use Authorization (EUA). This EUA will remain in effect (meaning this test can be used) for the duration of the COVID-19 declaration under Section 564(b)(1) of the Act, 21 U.S.C. section 360bbb-3(b)(1), unless the authorization is terminated or revoked.  Performed at [facility], [address] [place], [place] [address]   Urine Culture     Status: None   Collection Time: 06/21/22  6:24 PM   Specimen: In/Out Cath Urine  Result Value Ref Range Status   Specimen Description   Final    IN/OUT CATH URINE Performed at [facility], [address] [place], [place] [address]    Special Requests   Final    NONE Performed at [facility], [address] [place], [place] [address]    Culture   Final    NO GROWTH Performed at [facility], [address], [place], [place] [address]    Report Status 06/22/2022 FINAL  Final     Labs: BNP (last 3 results) Recent Labs    09/08/21 1400 10/07/21 1031 06/21/22 1542  BNP 116.3* 33.9 77.2   Basic Metabolic Panel: Recent Labs  Lab 06/23/22 0400 06/24/22 0341 06/25/22 0403 06/26/22 0431 06/27/22 0438 06/28/22 0922  NA 138 136 136 135 140 136  K 3.4* 3.5 3.5 4.1 3.7 3.5  CL 105 106 105 106 107 108  CO2 22 22 23  21* 21* 19*  GLUCOSE 113* 109* 121* 137* 126* 101*  BUN 22 21 24* 28* 40* 34*  CREATININE 1.03 0.96 0.98 1.00 1.35* 1.21  CALCIUM 8.8* 8.4* 8.6* 8.9 9.5 8.8*  MG 2.1 2.2 2.2 2.3 2.5*  --    Liver Function Tests: Recent Labs  Lab 06/21/22 1542 06/23/22 0400  AST 30 24  ALT 19 22  ALKPHOS 76 68  BILITOT 1.3* 1.0  PROT 7.4 6.0*  ALBUMIN 4.1 3.0*   No results for input(s): "LIPASE", "AMYLASE" in the last 168 hours. No results for input(s): "AMMONIA" in the last 168 hours. CBC: Recent Labs  Lab 06/24/22 0341  06/25/22 0403 06/26/22 0431 06/27/22 0438 06/28/22 0922  WBC 6.4 6.4 5.9 17.7* 10.6*  NEUTROABS 4.8 4.8 5.1 15.7* 8.3*  HGB 9.8* 9.9* 10.8* 11.9* 10.9*  HCT 30.2* 31.0* 33.5* 36.4* 33.3*  MCV 94.1 94.5 92.5 93.1 93.0  PLT 178 208 249 435* 306   Cardiac Enzymes: No results for input(s): "CKTOTAL", "CKMB", "CKMBINDEX", "TROPONINI" in the last 168 hours. BNP:  Invalid input(s): "POCBNP" CBG: No results for input(s): "GLUCAP" in the last 168 hours. D-Dimer No results for input(s): "DDIMER" in the last 72 hours. Hgb A1c No results for input(s): "HGBA1C" in the last 72 hours. Lipid Profile No results for input(s): "CHOL", "HDL", "LDLCALC", "TRIG", "CHOLHDL", "LDLDIRECT" in the last 72 hours. Thyroid function studies No results for input(s): "TSH", "T4TOTAL", "T3FREE", "THYROIDAB" in the last 72 hours.  Invalid input(s): "FREET3" Anemia work up No results for input(s): "VITAMINB12", "FOLATE", "FERRITIN", "TIBC", "IRON", "RETICCTPCT" in the last 72 hours. Urinalysis    Component Value Date/Time   COLORURINE YELLOW 06/21/2022 1824   APPEARANCEUR CLEAR 06/21/2022 1824   LABSPEC 1.044 (H) 06/21/2022 1824   PHURINE 6.0 06/21/2022 1824   GLUCOSEU NEGATIVE 06/21/2022 1824   HGBUR NEGATIVE 06/21/2022 1824   BILIRUBINUR NEGATIVE 06/21/2022 1824   KETONESUR 5 (A) 06/21/2022 1824   PROTEINUR 100 (A) 06/21/2022 1824   NITRITE NEGATIVE 06/21/2022 1824   LEUKOCYTESUR NEGATIVE 06/21/2022 1824   Sepsis Labs Recent Labs  Lab 06/25/22 0403 06/26/22 0431 06/27/22 0438 06/28/22 0922  WBC 6.4 5.9 17.7* 10.6*   Microbiology Recent Results (from the past 240 hour(s))  Blood Culture (routine x 2)     Status: Abnormal   Collection Time: 06/20/22  4:15 PM   Specimen: BLOOD  Result Value Ref Range Status   Specimen Description   Final    BLOOD SITE NOT SPECIFIED Performed at [facility], [place] [address], [place], [place] [address]    Special Requests   Final     BOTTLES DRAWN AEROBIC AND ANAEROBIC Blood Culture adequate volume Performed at [facility] [address] [place], [place] [address]    Culture  Setup Time   Final    GRAM POSITIVE RODS AEROBIC BOTTLE ONLY CRITICAL RESULT CALLED TO, READ BACK BY AND VERIFIED WITH: RN KELLY NEAL 06/21/22@5 :31 BY TW                                                                                                                                                                               Culture (A)  Final    BACILLUS SPECIES Standardized susceptibility testing for this organism is not available. Performed at [facility], [place] [address], [place], [place] [address]    Report Status 06/22/2022 FINAL  Final  Blood Culture (routine x 2)     Status: None   Collection Time: 06/20/22  4:24 PM   Specimen: BLOOD  Result Value Ref Range Status   Specimen Description   Final    BLOOD SITE NOT SPECIFIED Performed at [facility] [address] [place], [place] [address]  Special Requests   Final    BOTTLES DRAWN AEROBIC AND ANAEROBIC Blood Culture adequate volume Performed at [facility] [address] [place], [place] [address]    Culture   Final    NO GROWTH 5 DAYS Performed at [facility], [place] [address], [place], [place] [address]    Report Status 06/25/2022 FINAL  Final  Urine Culture     Status: None   Collection Time: 06/20/22  5:34 PM   Specimen: In/Out Cath Urine  Result Value Ref Range Status   Specimen Description   Final    IN/OUT CATH URINE Performed at [facility] [address], [place], [place] [address]    Special Requests   Final    NONE Performed at [facility], [place] [address], [place], [place] [address]    Culture   Final    NO GROWTH Performed at [facility], [place] [address], [place], [place] [address]    Report Status 06/21/2022 FINAL  Final  Blood  Culture (routine x 2)     Status: None   Collection Time: 06/21/22  4:10 PM   Specimen: BLOOD  Result Value Ref Range Status   Specimen Description   Final    BLOOD SITE NOT SPECIFIED Performed at [facility] [address], [place], [place] [address]    Special Requests   Final    BOTTLES DRAWN AEROBIC AND ANAEROBIC Blood Culture adequate volume Performed at [facility] [address], [place], [place] [address]    Culture   Final    NO GROWTH 5 DAYS Performed at [facility], [place] [address], [place], [place] [address]    Report Status 06/26/2022 FINAL  Final  Resp Panel by RT-PCR (Flu A&B, Covid) Anterior Nasal Swab     Status: None   Collection Time: 06/21/22  5:51 PM   Specimen: Anterior Nasal Swab  Result Value Ref Range Status   SARS Coronavirus 2 by RT PCR NEGATIVE NEGATIVE Final    Comment: (NOTE) SARS-CoV-2 target nucleic acids are NOT DETECTED.  The SARS-CoV-2 RNA is generally detectable in upper respiratory specimens during the acute phase of infection. The lowest concentration of SARS-CoV-2 viral copies this assay can detect is 138 copies/mL. A negative result does not preclude SARS-Cov-2 infection and should not be used as the sole basis for treatment or other patient management decisions. A negative result may occur with  improper specimen collection/handling, submission of specimen other than nasopharyngeal swab, presence of viral mutation(s) within the areas targeted by this assay, and inadequate number of viral copies(<138 copies/mL). A negative result must be combined with clinical observations, patient history, and epidemiological information. The expected result is Negative.  Fact Sheet for Patients:  EntrepreneurPulse.com.au  Fact Sheet for Healthcare Providers:  IncredibleEmployment.be  This test is no t yet approved or cleared by the [place] FDA and  has been  authorized for detection and/or diagnosis of SARS-CoV-2 by FDA under an Emergency Use Authorization (EUA). This EUA will remain  in effect (meaning this test can be used) for the duration of the COVID-19 declaration under Section 564(b)(1) of the Act, 21 U.S.C.section 360bbb-3(b)(1), unless the authorization is terminated  or revoked sooner.       Influenza A by PCR NEGATIVE NEGATIVE Final   Influenza B by PCR NEGATIVE NEGATIVE Final    Comment: (NOTE) The Xpert Xpress SARS-CoV-2/FLU/RSV plus assay is intended as  an aid in the diagnosis of influenza from Nasopharyngeal swab specimens and should not be used as a sole basis for treatment. Nasal washings and aspirates are unacceptable for Xpert Xpress SARS-CoV-2/FLU/RSV testing.  Fact Sheet for Patients: EntrepreneurPulse.com.au  Fact Sheet for Healthcare Providers: IncredibleEmployment.be  This test is not yet approved or cleared by the [place] FDA and has been authorized for detection and/or diagnosis of SARS-CoV-2 by FDA under an Emergency Use Authorization (EUA). This EUA will remain in effect (meaning this test can be used) for the duration of the COVID-19 declaration under Section 564(b)(1) of the Act, 21 U.S.C. section 360bbb-3(b)(1), unless the authorization is terminated or revoked.  Performed at [facility], [place] [address] [place], [place] [address]   Urine Culture     Status: None   Collection Time: 06/21/22  6:24 PM   Specimen: In/Out Cath Urine  Result Value Ref Range Status   Specimen Description   Final    IN/OUT CATH URINE Performed at [facility] [address] [place], [place] [address]    Special Requests   Final    NONE Performed at [facility], [place] [address], [place], [place] [address]    Culture   Final    NO GROWTH Performed at [facility], [place] [address] [place], [place] [address]     Report Status 06/22/2022 FINAL  Final     Time coordinating discharge: Over 30 minutes  SIGNED:   Darliss Cheney, MD  [facility] 06/28/2022, 11:01 AM *Please note that this is a verbal dictation therefore any spelling or grammatical errors are due to the "[place] One" system interpretation. If 7PM-7AM, please contact night-coverage [email or web address]

## 2022-06-28 NOTE — Plan of Care (Signed)

## 2022-06-28 NOTE — Progress Notes (Signed)
Physical Therapy Treatment Patient Details Name: Ryan Glass MRN: [id number] DOB: 10-27-1956 Today's Date: 06/28/2022   History of Present Illness 66 yo male admitted with bacteremia, gout flare, sepsis. Recent hx of R TKA 06/18/22. Hx of L TKA, AICD, Afib, CHF, gout, CKD, CAD, OSA, hernia repair, RA    PT Comments    Pt seen POD10. Demonstrated modified independence with bed mobility, supervision for transfers and ambulation in hallway 159f with RW; pt's gait has improved greatly over the course of their acute stay. Reviewed HEP and pt demonstrated good form without cuing; reinforced importance of regular completion of exercises and walking program. Pt has met mobility goals for safe discharge home with HHPT and supervision from family. All education has been completed and pt's questions answered to their satisfaction. See below for discharge and equipment recommendations; PT is signing off. Thank you for this referral.    Recommendations for follow up therapy are one component of a multi-disciplinary discharge planning process, led by the attending physician.  Recommendations may be updated based on patient status, additional functional criteria and insurance authorization.  Follow Up Recommendations  Home health PT (Pt's insurance denied SNF, pt demonstrated safe mobility with ambulation and stairs to discharge home with [facility]) Can patient physically be transported by private vehicle: Yes   Assistance Recommended at Discharge Frequent or constant Supervision/Assistance  Patient can return home with the following A little help with walking and/or transfers;A little help with bathing/dressing/bathroom;Assistance with cooking/housework;Assist for transportation;Help with stairs or ramp for entrance   Equipment Recommendations  None recommended by PT    Recommendations for Other Services OT consult     Precautions / Restrictions Precautions Precautions:  Fall Restrictions Weight Bearing Restrictions: No RLE Weight Bearing: Weight bearing as tolerated Other Position/Activity Restrictions: WBAT     Mobility  Bed Mobility Overal bed mobility: Modified Independent       Supine to sit: Modified independent (Device/Increase time)     General bed mobility comments: Increased time    Transfers Overall transfer level: Needs assistance Equipment used: Rolling walker (2 wheels) Transfers: Sit to/from Stand Sit to Stand: Supervision   Step pivot transfers: Supervision       General transfer comment: For safety only, cues for sequencing and to push up from bed    Ambulation/Gait Ambulation/Gait assistance: Supervision Gait Distance (Feet): 130 Feet Assistive device: Rolling walker (2 wheels) Gait Pattern/deviations: Step-to pattern, Decreased dorsiflexion - right Gait velocity: decreased     General Gait Details: Pt ambulated 1349f with RW sueprvision only no physical assist required or overt LOB noted. Demonstrated improved weightbearing on RLE and increased gait, slightly more reciprocal gait pattern.   Stairs             Wheelchair Mobility    Modified Rankin (Stroke Patients Only)       Balance Overall balance assessment: Needs assistance Sitting-balance support: Feet supported, No upper extremity supported Sitting balance-Leahy Scale: Good     Standing balance support: Reliant on assistive device for balance, During functional activity, Bilateral upper extremity supported Standing balance-Leahy Scale: Poor                              Cognition Arousal/Alertness: Awake/alert Behavior During Therapy: WFL for tasks assessed/performed Overall Cognitive Status: Within Functional Limits for tasks assessed  Exercises Total Joint Exercises Ankle Circles/Pumps: AROM, Both, 10 reps, Supine Quad Sets: AROM, Both, 10 reps, Supine Heel  Slides: AROM, Right, 10 reps, Supine Hip ABduction/ADduction: AROM, Right, 10 reps, Supine Straight Leg Raises: AROM, 10 reps, Right, Supine Long Arc Quad: AROM, Right, 10 reps Knee Flexion: AROM, Right, Seated, 10 reps    General Comments        Pertinent Vitals/Pain Pain Assessment Pain Assessment: 0-10 Pain Score: 5  Pain Location: R knee Pain Descriptors / Indicators: Aching, Discomfort, Guarding, Grimacing Pain Intervention(s): Monitored during session, Repositioned    Home Living                          Prior Function            PT Goals (current goals can now be found in the care plan section) Acute Rehab PT Goals Patient Stated Goal: less pain PT Goal Formulation: With patient Time For Goal Achievement: 07/08/22 Potential to Achieve Goals: Good Progress towards PT goals: Progressing toward goals    Frequency    Min 5X/week      PT Plan Discharge plan needs to be updated    Co-evaluation              AM-PAC PT "6 Clicks" Mobility   Outcome Measure  Help needed turning from your back to your side while in a flat bed without using bedrails?: A Little Help needed moving from lying on your back to sitting on the side of a flat bed without using bedrails?: A Little Help needed moving to and from a bed to a chair (including a wheelchair)?: A Little Help needed standing up from a chair using your arms (e.g., wheelchair or bedside chair)?: A Little Help needed to walk in hospital room?: A Little Help needed climbing 3-5 steps with a railing? : A Little 6 Click Score: 18    End of Session Equipment Utilized During Treatment: Gait belt Activity Tolerance: Patient tolerated treatment well Patient left: with call bell/phone within reach;in chair;with chair alarm set Nurse Communication: Mobility status PT Visit Diagnosis: Pain;Difficulty in walking, not elsewhere classified (R26.2) Pain - Right/Left: Right Pain - part of body: Knee      Time: 9449-6759 PT Time Calculation (min) (ACUTE ONLY): 18 min  Charges:  $Gait Training: 8-22 mins                     Coolidge Breeze, PT, DPT [facility] Office: [phone] Pager: [phone]   Coolidge Breeze 06/28/2022, 11:48 AM

## 2022-06-28 NOTE — TOC Transition Note (Signed)
Transition of Care [facility]) - CM/SW Discharge Note   Patient Details  Name: VERTIS SCHEIB MRN: [id number] Date of Birth: 04/11/1956  Transition of Care [facility]) CM/SW Contact:  Amada Jupiter, LCSW Phone Number: 06/28/2022, 11:48 AM   Clinical Narrative:    Pt has continued to progress with mobility and plan now changed back to home with home health services.  Pt is active with [facility] and I have sent new orders to restart services.  Pt has all needed DME at home and feels ready/ safe for home dc.  No further TOC needs.   Final next level of care: Home w Home Health Services Barriers to Discharge: Barriers Resolved   Patient Goals and CMS Choice Patient states their goals for this hospitalization and ongoing recovery are:: prefers to dc home but concerned about mobility in the home and especially stairs      Discharge Placement                       Discharge Plan and Services In-house Referral: Clinical Social Work              DME Arranged: N/A DME Agency: NA       HH Arranged: PT HH Agency: [facility] Date [facility] Agency Contacted: 06/28/22 Time HH Agency Contacted: 1030 Representative spoke with at [facility] Agency: Cindie  Social Determinants of Health (SDOH) Interventions     Readmission Risk Interventions    06/26/2022    2:18 PM 10/12/2021    1:08 PM  Readmission Risk Prevention Plan  Transportation Screening Complete Complete  PCP or Specialist Appt within 3-5 Days Complete   HRI or Home Care Consult Complete   Social Work Consult for Recovery Care Planning/Counseling Complete   Palliative Care Screening Not Applicable   Medication Review Ocean[facility]) Complete Complete  PCP or Specialist appointment within 3-5 days of discharge  Complete  HRI or Home Care Consult  Complete  SW Recovery Care/Counseling Consult  Complete  Palliative Care Screening  Not Applicable  Skilled Nursing Facility  Not Applicable

## 2022-06-29 NOTE — Telephone Encounter (Signed)
Patient contacted via telephone stated slept until 0230 this am woke up and couldn't fall back asleep.  Was able to walk up two levels in house today for breakfast and then to take shower prior to PT.  Took 15mg  prednisone today as foot still swollen/painful today.  He took his gout medications this am in addition to prednisone.  Spouse woke up with fever today and went to doctor had covid testing and was told will be 24-48 hours to get results.  Sore throat.  Rapid strep test negative.  She was given Rx by provider.  Discussed with patient spouse can do OTC home covid test if she wants results today.  Patient stated PT went well.  Taking a nap before I called.  Plans to do exercises later today.  Using ice cooler on knee and working well.  Patient energy level higher today and voice sounds normal energy level.  No wheezing/shortness of breath audible.  Spoke full sentences without difficulty A&Ox3.  Discussed with patient I will contact him again Sunday.  If questions or concerns can call me at [phone] prior to that time.  He stated received note from Dr [phone] out of work until 17 Sep and he forwarded to 19 Sep.  He asked if I notified HR he was in hospital and then discharged and I notified him that I had.  Patient verbalized understanding information, had no further questions or concerns at this time.

## 2022-06-30 ENCOUNTER — Telehealth: Payer: Self-pay | Admitting: Registered Nurse

## 2022-06-30 DIAGNOSIS — Z20822 Contact with and (suspected) exposure to covid-19: Secondary | ICD-10-CM

## 2022-06-30 DIAGNOSIS — U071 COVID-19: Secondary | ICD-10-CM

## 2022-06-30 NOTE — Telephone Encounter (Unsigned)
Patient notified me his home test negative today.  He asked that I call to check on him again tomorrow.  Discussed that his regular medications atorvastatin, apixban, colchicine, loratadine, oxycodone and miralax not recommended to take with paxlovid so if positive test would Rx for molnupiravir.  Notified patient handouts in his my chart account.  He will retest in 5 days unless new symptoms of covid.  Reviewed symptoms of covid.  He will notify his home health and PT providers spouse tested positive for covid and he was recommended to wear mask/isolate from others and monitor for symptoms x 10 days.  Date of exposure 7/21  Day 10 7/31  Patient A&Ox3 spoke full sentences without difficulty no audible cough/congestion/throat clearing during 8 minute call.    Discussed that spouse should wear mask x10 days when around him; longer if still testing positive.  Discussed spouse home retest Friday or Saturday 28/29 Jul.  Patient verbalized understanding information/instructions, agreed with plan of care and had no further questions at this time.  Epocrates also reviewed drug interaction checker and paxlovid sotalol has allert monitor HR/consider ECG and risk of PR interval prolongation, AV block, bradycardia  Ritonavir and prednisone may increase prednisone levels   Nirmatrelvir and prednisone may decrease nirmatrelvir levels, efficacy  Fluticasone and paxlovid may increase plasma fluticasone exposure  Per epocrates no known drug interactions with patient medication list; but clinical drug interaction studies are not available.

## 2022-06-30 NOTE — Telephone Encounter (Unsigned)
Patient contacted via telephone.  He asked if anything he can take to prevent covid.  NIH and CDC website reviewed.  Currently no medications outpatient FDA approved for prevention covid infection. Evusheld authorization rescinded due to 90% variants resistant to evusheld at this time.  Patient having some symptoms today voice hoarse.  Instructed patient to do home covid test and notify me of results.  Repeat test in 5 days if negative today.  Wear mask when around spouse and spouse should also be wearing mask if out of her bedroom/bathroom.  Patient spouse had visited with friend who had sick family member with covid.  Most likely her infection source.  Symptoms started may on Thursday for sure Friday 21 Jul and she had appt for testing at clinic.  Notified today by public health her test positive. [employer] handouts on covid symptoms management, infection prevention in household and quarantine/isolation sent to patient my chart.  Patient to wear mask/monitor for symptoms x 10 days when around others.  Consider sanitizing/disinfecting high touch surfaces with lysol/chlorox/bleach spray/wipes daily e.g. door handles, water faucet handles, countertops, fridge/microwave handles.  Frequent handwashing especially before eating/touching face.  Patient verbalized understanding information/instructions, agreed with plan of care and had no further questions at this time.  Duration of call 23 minutes.  Reviewed [facility] drug/medication Scientist, research (physical sciences) regarding paxlovid and patient medication list in case home test positive.  Alerts as below:  Nirmatrelvir/ritonavir (5 days)  Colchicine  More InfoQuality of Evidence: Very LowSummary:Coadministration with nirmatrelvir/ritonavir has not been studied. Colchicine is a CYP3A4 and P-gp substrate. Coadministration of ritonavir (100 mg twice daily for 5 days) and colchicine (0.6 mg single dose) increased colchicine Cmax and AUC by 2.7-fold and 3.5-fold,  respectively. In addition, there is the potential effect of inflammation to increase colchicine exposure so extreme caution is necessary. Unless there is a compelling reason to administer (with dose reduction) coadministration is not recommended. Note, coadministration of colchicine and P-gp inhibitors or strong CYP3A4 inhibitors is contraindicated in patients with renal or hepatic impairment.Description: Coadministration is contraindicated. Increased plasma concentrations of colchicine may result in serious and/or life-threatening reactions in patients with renal and/or hepatic impairment. Concentrations of colchicine are expected to increase when coadministered with ritonavir. Life-threatening and fatal drug interactions have been reported in patients treated with colchicine and ritonavir (CYP3A4 and P-gp inhibition). Paxlovid Summary of Geographical information systems officer, [employer], October 2022.  Co-administration may increase colchicine concentrations. Co-administration contraindicated due to potential for serious and/or life-threatening reactions in patients with renal and/or hepatic impairment. Paxlovid [place] Prescribing Information, [employer], May 2023.  Coadministration of ritonavir (100 mg twice daily for 5 days) and colchicine (0.6 mg single dose) was studied in 18 subjects. Ritonavir significantly increased colchicine Cmax and AUC (by 2.7-fold and 3.5-fold, respectively). Dosage adjustments of colchicine are required when given with CYP3A4 and/or P-gp inhibitors. Novel evidence-based colchicine dose-reduction algorithm to predict and prevent colchicine toxicity in the presence of cytochrome P450 3A4/P-glycoprotein inhibitors. Terkeltaub RA, Rogelia Mire, Digiacinto JL, et al. Arthritis Rheum, 2011, 63(8): [id number].  Nirmatrelvir/ritonavir (5 days)  Apixaban  Look for alternativesMore InfoQuality of Evidence: Very LowSummary:Coadministration has not been studied. Apixaban is a substrate of P-gp and is  metabolized by CYP3A4. Concentrations of apixaban are expected to increase due to CYP3A4 and P-gp inhibition by ritonavir. The product labels for apixaban do not recommend the concomitant use with strong dual CYP3A4 and P-gp inhibitors, although the [place] label for apixaban gives the option to use apixaban at a reduced  dose (i.e., 2.5 mg) if needed. Of interest, no adverse outcomes were reported in six HIV infected patients treated with a reduced dose of apixaban (2.5 mg twice daily) while on ritonavir boosted regimens suggesting that a reduced dose of apixaban could be used with nirmatrelvir/ritonavir. If nirmatrelvir/ritonavir treatment is needed, consider adjusting apixaban dosage according to risk, indication and current dose. For treatment of atrial fibrillation with standard apixaban dose (i.e., 5 mg twice daily), reduce apixaban to 2.5 mg twice daily. For treatment of atrial fibrillation with low dose apixaban (i.e., 2.5 mg twice daily), continue low dose on a case-by-case basis. For patients at high risk of venous/arterial thromboembolism (VTE/ATE), consider switching from apixaban to low molecular weight heparin (LMWH); patients with a lower risk of VTE/ATE could be switched to aspirin on a case-by-case basis. The usual apixaban treatment should be resumed 3 days after the last dose of nirmatrelvir/ritonavir.Description: Potentially increased apixaban concentrations which may lead to an increased bleeding risk. Refer to apixaban Summary of Product Characteristics for further information. Paxlovid Summary of Geographical information systems officer, [employer], October 2022.  Coadministration may increase apixaban concentrations. Combined P-gp and strong CYP3A4 inhibitors increase blood levels of apixaban and increase the risk of bleeding. Dosing recommendations for co-administration of apixaban with Paxlovid depend on the apixaban dose. Refer to the apixaban product label for more information. Paxlovid [place] Prescribing  Information, [employer], May 2023.  Nirmatrelvir/ritonavir (5 days)  Atorvastatin  Look for [employer] of Evidence: Very LowSummary:Coadministration has not been studied. Atorvastatin is metabolized by CYP3A4 and concentrations are expected to increase due to inhibition of CYP3A4 by nirmatrelvir/ritonavir. Coadministration is not recommended unless specifically required for patient management. Given the short duration of nirmatrelvir/ritonavir treatment, atorvastatin should be stopped. The pragmatic approach to stop temporarily atorvastatin (or any other statin) is acceptable considering that it will not negatively affect the therapeutic effect but can minimize the risk for adverse events related to a drug interaction. if it is decided to pause atorvastatin during nirmatrelvir/ritonavir treatment, atorvastatin should be restarted 3 days after the last dose of nirmatrelvir/ritonavir as CYP3A4 inhibition by ritonavir takes several days to resolve. If coadministration is necessary, reduce atorvastatin to 10 mg daily and resume the usual dose 3 days after completing nirmatrelvir/ritonavir.Description: Atorvastatin is less dependent on CYP3A for metabolism. When used with ritonavir dosed as a pharmacokinetic enhancer or as an antiretroviral agent, the lowest possible doses of atorvastatin should be administered. Paxlovid Summary of Geographical information systems officer, [employer], October 2022.  Co-administration may increase atorvastatin concentrations. Consider temporary discontinuation of atorvastatin during treatment with Paxlovid. Atorvastatin does not need to be held prior to or after completing Paxlovid. Paxlovid [place] Prescribing Information, [employer], May 2023.  Nirmatrelvir/ritonavir (5 days)  Oxycodone  Look for [employer] of Evidence: Very LowSummary:Coadministration with nirmatrelvir/ritonavir has not been studied. Oxycodone is metabolised principally to noroxycodone  via CYP3A and oxymorphone via CYP2D6. Oxymorphone has some analgesic activity but is present in the plasma in low concentrations and is not considered to contribute to oxycodone's pharmacological effect. Concentrations of oxycodone may increase due to CYP3A4 inhibition by ritonavir. A dose reduction of oxycodone may be required to prevent opioid-related adverse effects with clinical monitoring. After stopping nirmatrelvir/ritonavir, the CYP3A4 inhibitory effect of nirmatrelvir/ritonavir is predicted to mostly disappear after 3 days. If it is decided to stop oxycodone and treat with another analgesic during nirmatrelvir/ritonavir treatment, oxycodone treatment can be resumed 3 days after the last dose of nirmatrelvir/ritonavir.Description: Co-administration may increase oxycodone concentrations. Careful monitoring of therapeutic  and adverse effects (including potentially fatal respiratory depression) is recommended when oxycodone is concomitantly administered with Paxlovid. Paxlovid [place] Prescribing Information, [employer], May 2023.  Nirmatrelvir/ritonavir (5 days)  Loratadine  More InfoQuality of Evidence: Very LowSummary:Coadministration has not been studied. Loratadine is metabolized by CYP3A4 and CYP2D6. Nirmatrelvir/ritonavir is a strong inhibitor of CYP3A4 and may increase loratadine concentrations. However, as loratadine has a wide therapeutic index, no dosage adjustment is needed.Description: Ritonavir dosed as a pharmacokinetic enhancer inhibits CYP3A and as a result is expected to increase the plasma concentrations of loratadine. Careful monitoring of therapeutic and adverse effects is recommended when loratadine is coadministered with ritonavir. Paxlovid Summary of Geographical information systems officer, [employer], October 2022.  Nirmatrelvir/ritonavir (5 days)  Macrogol (Polyethylene Glycol 3350)  More InfoQuality of Evidence: Very LowSummary:Coadministration has not been studied. Macrogol is unabsorbed  from the gastrointestinal tract and is excreted, unaltered, in faeces. Any macrogol that does enter the circulation is excreted in urine. Nirmatrelvir/ritonavir is metabolized by CYP3A and is a strong inhibitor of CYP3A4. However, the absorption of other oral medications may be reduced due to increased gastrointestinal transit with macrogol. Do not take macrogol within two hours of taking another oral medication. [Note: this interaction is not specific for nirmatrelvir/ritonavir, but for any oral medication taken with macrogol.]Description:(See Summary)  No interaction expected Nirmatrelvir/ritonavir (5 days)  Allopurinol  No Interaction Expected Nirmatrelvir/ritonavir (5 days)  Docusate  No Interaction Expected Nirmatrelvir/ritonavir (5 days)  Ferrous sulfate  No Interaction Expected Nirmatrelvir/ritonavir (5 days)  Fluticasone  No Interaction Expected Nirmatrelvir/ritonavir (5 days)  Folic acid  No Interaction Expected Nirmatrelvir/ritonavir (5 days)  Furosemide  No Interaction Expected Nirmatrelvir/ritonavir (5 days)  Gabapentin  No Interaction Expected Nirmatrelvir/ritonavir (5 days)  Hydralazine  No Interaction Expected Nirmatrelvir/ritonavir (5 days)  Magnesium salts (oral)  No Interaction Expected Nirmatrelvir/ritonavir (5 days)  Montelukast  No Interaction Expected Nirmatrelvir/ritonavir (5 days)  Potassium  No Interaction Expected Nirmatrelvir/ritonavir (5 days)  Prednisone  No Interaction Expected Nirmatrelvir/ritonavir (5 days)  Salbutamol (Albuterol)  No Interaction Expected Nirmatrelvir/ritonavir (5 days)  Senna  No Interaction Expected Nirmatrelvir/ritonavir (5 days)  Sotalol  No Interaction Expected Nirmatrelvir/ritonavir (5 days)  Spironolactone  No Interaction Expected Nirmatrelvir/ritonavir (5 days)  Vitamin D3 (Colecalciferol, cholecalciferol)

## 2022-07-01 NOTE — Telephone Encounter (Signed)
Patient contacted via telephone stated voice a little hoarse today; runny nose/congestion the same as yesterday.  Encouraged patient to take another covid test tomorrow (Monday).  Patient unsure if he has any more covid tests at home.  If not he will get a friend to buy for him since unable to drive.  PT home coming on Tuesday.  Patient to notify me of his test results [email or web address].  Patient A&Ox3 spoke full sentences without difficulty.  No cough audible during 5 minute call.  Voice deeper than usual today.  No audible wheezing/shortness of breath.  He asked me to call Ander Slade (spouse) cell phone today.  I did her symptoms stable still sore throat. She is staying upstairs in bedroom and he is downstairs in recliner. He is still having foot pain possible expanding to other side.  Continue to monitor, low purine diet, hydrate with water and take his medications as prescribed.  Spouse frustrated that after each surgery he has had gout flare.  Spouse has been reading more on internet regarding foods that can cause gout flare and typically these are the foods that patient likes the most.  Discussed that he isn't banned from eating these foods but typically not recommended during flare and when he does eat them should be a smaller portion and not double portions/seconds.  Patient verbalized understanding information/instructions, agreed with plan of care and had no further questions at this time.

## 2022-07-02 MED ORDER — MOLNUPIRAVIR EUA 200MG CAPSULE: 4.0000 | ORAL_CAPSULE | Freq: Two times a day (BID) | ORAL | 0 refills | Status: AC

## 2022-07-02 NOTE — Telephone Encounter (Signed)
Patient contacted NP notified her positive home covid test today and body aches/fever 102 new.  Patient asking if he should take tylenol #3 or regular tylenol for his fever today.  Discussed I recommended plain tylenol 500mg  max 2 tabs every 6 hours as needed for pain/fever.  Encouraged patient to hydrate as will need more water than usual when running fever.  Electronic Rx sent to his pharmacy of choice for Karin Golden 200mg  sig t4 po BID x 5 days #40 RF0  EUA molnupiravir patient handout sent to patient.  Patient reported foot still hurting also along with sore throat.  Discussed with patient tylenol #3 used for post surgical pain typically and he is currently using oxycodone.  Start 10 day mask wear and quarantining at home for 5 days.  Day 0 7/24  Day 5 7/29  Day 10 3 Aug.  Patient to notify his home health and PT providers and any visitors in the previous 48 hours they should monitor for symptoms/test in 5 days post exposure.  Patient A&Ox3 spoke full sentences without difficulty.  Respirations even and unlabored.  No audible cough/nasal congestion/throat clearing during 3 minute telephone call.  Discussed with patient I would follow up with him via telephone again tomorrow.  Patient verbalized understanding information/instructions, agreed with plan of care and had no further questions at this time.

## 2022-07-03 MED ORDER — ALBUTEROL SULFATE HFA 108 (90 BASE) MCG/ACT IN AERS
2.0000 | INHALATION_SPRAY | RESPIRATORY_TRACT | 1 refills | Status: DC | PRN
Start: 2022-07-03 — End: 2022-08-15

## 2022-07-03 MED ORDER — BENZONATATE 200 MG PO CAPS: 200.0000 mg | ORAL_CAPSULE | Freq: Three times a day (TID) | ORAL | 0 refills | Status: AC | PRN

## 2022-07-03 NOTE — Telephone Encounter (Signed)
Patient contacted via telephone stated he started molnupiravir yesterday and is feeling better today.  Frequent awakenings every hours last night didn't sleep well.  Has noticed some cough would like refill on his albuterol inhaler 1-2 puffs po q4-6h prn protracted cough/wheezing/chest tightness #1 RF1 may substitute with formulary alternative and tessalon pearles 200mg  po TID prn cough #30 RF0 last dispensed 2022 electronic Rx sent to his pharmacy of choice for both medicines.  Discussed with patient if protracted coughing use albuterol inhaler.  If intermittent mild cough tessalon pearles may be better option.  Swallow whole do not chew capsules.  May use OTC robitussin/delsym or cough drops if prefers per manufacturer instructions.   Spouse Joy on speakerphone with patient today also.  She is doing better.  Patient asking if he should increase his prednisone dosing.  Discussed with patient no and to continue 15mg  po daily if still having gout flare but not to increase any further and to take his gout medication as prescribed.  Patient stated he was taking 1 oxycodone tablet for pain at discharge but due to pain worsening again this week had increased to 2 during day.  Discussed with patient covid can worsen inflammation in body and that could be why he is experiencing more pain at this time. Avoid dehydration, drink water, eat regular meals/avoid skipping meals.   Molnupiravir should help decrease viral load and inflammation.  Patient asked if rebound symptoms common after antiviral finished and discussed that yes it can happen but every patient does not experience it.  Patient asked if he should hold his orencia this week.  Discussed with patient I did recommend holding orencia this week after reviewing epocrates.  Discussed half life drug 14 days per Epocrates.  Discussed with patient he still has drug circulating in his system but just at lower dose.  Patient reported he does not believe he received a dose  while in the hospital.  Patient voice quality back to baseline today.  No cough audible during 14 minute telephone call.  Discussed with patient I would follow up with him via telephone again tomorrow.  Continue home quarantine.  Patient A&Ox3 spoke full sentences without difficulty.  No wheezing/shortness of breath audible.  Has not check sp02 with his home monitor today will do so later today.  Patient verbalized understanding information/instructions, agreed with plan of care and had no further questions at this time.

## 2022-07-04 NOTE — Telephone Encounter (Signed)
Patient contacted via telephone stated still not sleeping well at night frequent awakenings.  Using his blue and red inhalers.  BP today 138/82 and sp02 92% RA  Took 15 mg prednisone again today.  Molnupiravir 4 tabs twice a day and feeling better since starting use.  PT still kept his appt today in home therapy just wore protective gear.  PT coming back again on Friday 7/28.  Discussed with patient to ensure using his blue inhaler every day as prescribed and while sp02 less than 97% or if protracted coughing/chest tightness/wheezing use albuterol 2 puffs every 4 hours.  When sleeping and/or taking nap to wear his cpap.  Patient A&Ox3 spoke full sentences without difficulty; no audible cough/congestion/throat clearing during 8 minute telephone call.  Patient reported spouse feeling better today.  Continue home quarantine.  Will call patient again tomorrow to follow up/re-evaluate.  Patient verbalized understanding information/instructions, agreed with plan of care and had no further questions at this time.

## 2022-07-05 NOTE — Telephone Encounter (Signed)
Patient contacted via telephone having some cough today.  Ran out of albuterol and was unable to find anyone to go to pharmacy for him yesterday.  Hoping his spouse will feel well enough to drive to pharmacy today.   Sp02 91-92% RA now.  Earlier today 96% per patient this am check.   Discussed with patient to stay in air conditioning today as heat index greater than 100 and had knee replacement surgery and do not recommend driving himself.  Discussed if he is going in car with spouse recommend to run it with air conditioning to cool off before he enters car as heat/humidity will make his breathing more labored.  Patient stated gout improving. Discussed with patient one more day prednisone 30mg  then decrease to baseline 10mg  daily.  Continue gout medications.  Patient asking when he should retest discussed next Monday or Day 10 of quarantine.  He stated spouse tested today last day of paxlovid and positive line very faint.   One cough wet sounding during 9 minute telephone call today.  A&Ox3 spoke full sentences without difficulty.  No audible wheezing/shortness of breath.  Will follow up with patient again tomorrow re-evaluation via telephone.  Continuing quarantine and molnupiravir.  Patient had no further questions or concerns at this time, verbalized understanding information/instructions and agreed with plan of care.

## 2022-07-05 NOTE — Telephone Encounter (Signed)
See tcon dated 06/30/22

## 2022-07-06 NOTE — Telephone Encounter (Signed)
Patient contacted via telephone was able to get albuterol refill last night spouse went to pharmacy.  Sp02 96% RA today.  Using his incentive spirometer.  PT came today and had therapy this morning.  Went up and down steps.  Foot/ankle swelling decreased along with pain.  Plans to retest covid on Sunday.  Taking his last doses of molnupiravir today.  Stated cough/congestion improved.  Spoke full sentences without difficulty; voice back to normal strength/timbor.  No audible cough/congestion/throat clearing or wheezing during 14 minute call today.  Continue quarantine/mask wear when around others.  Stay in air conditioning today and tomorrow predicted heat index greater than 100.  A&Ox3.  Patient verbalized understanding information/instructions, agreed with plan of care and had no further questions at this time.  Patient stated he notified [facility] HR he is home on quarantine with covid.

## 2022-07-08 NOTE — Telephone Encounter (Signed)
AVS instructions included on discharge  Follow up with PCP in 1-2 weeks Please obtain BMP/CBC in one week Follow-up with orthopedics within 2 weeks Please follow up with your PCP on the following pending results: Unresulted Labs (From admission, onward)      None     Reviewed hospital discharge instructions with patient again today 07/08/22.  He stated PCM will not allow you to come into office if still testing positive for covid.  He plans to test spouse and him today again and if negative will call to schedule appt with PCM Agui[facility].  Reminded patient BMET and CBC labs recommended this week.  Discharge date was 06/28/22.  He has follow up appts with orthopedics and pulmonology on Thursday scheduled. Patient reported occasional cough and runny nose when eating still.  Patient asking if this is still covid symptoms discussed that it could be a mix of covid/allergies and if hot/cold foods can elicit runny nose also.  Gout better and trying not to take second pain medication at 12 hour point and get back down to his baseline for pain medication.  A&Ox3 spoke full sentences without difficulty no cough/congestion/throat clearing audible during 5 minute call.  Patient reported spouse Joy is doing well and denied concerns or questions.  Patient plans to try walking outside in the mornings this week as cooler temperatures 60s and no rain predicted.   Patient verbalized understanding information/instructions, agreed with plan of care and had no further questions at this time. 

## 2022-07-10 ENCOUNTER — Other Ambulatory Visit (HOSPITAL_COMMUNITY): Payer: Self-pay

## 2022-07-11 NOTE — Telephone Encounter (Signed)
Patient contacted via telephone stated feeling better but still occasional cough and hoarse voice especially after eating lunch.  Patient has not covid retested yet.  Day 10 of mask wear/isolation 3 Aug.   His home PT provider said he was approved for another 2 visits next week.  Pulmonology and orthopedics follow up appts tomorrow.  [facility] Monday  7 Aug scheduled.  Patient stated only has 5 days of pain medication remaining and he needs especially to take 1 tab prior to PT appts.  Discussed with patient he will need to request refill from his orthopedics provider as I cannot fill controlled medication Rx for patient due to contract limitations in this clinic.  Patient stated pain and mobility improving little by little each week.  Using his ice cooler 3 times per day and it definitely helps also on knee.  Patient watching women's international soccer games this week.  Patient A&Ox3 spoke full sentences without difficulty; no audible cough/congestion/throat clearing during 6 minute call.  Discussed with patient will follow up with him again this weekend to review instructions from tomorrow appts.  Patient verbalized understanding information/instructions, agreed with plan of care and had no further questions at this time.

## 2022-07-12 ENCOUNTER — Ambulatory Visit (INDEPENDENT_AMBULATORY_CARE_PROVIDER_SITE_OTHER): Payer: No Typology Code available for payment source | Admitting: Pulmonary Disease

## 2022-07-12 ENCOUNTER — Encounter: Payer: Self-pay | Admitting: Pulmonary Disease

## 2022-07-12 VITALS — BP 120/70 | HR 82 | Ht 68.0 in | Wt 227.4 lb

## 2022-07-12 DIAGNOSIS — I2699 Other pulmonary embolism without acute cor pulmonale: Secondary | ICD-10-CM

## 2022-07-12 DIAGNOSIS — J4530 Mild persistent asthma, uncomplicated: Secondary | ICD-10-CM

## 2022-07-12 DIAGNOSIS — G4733 Obstructive sleep apnea (adult) (pediatric): Secondary | ICD-10-CM

## 2022-07-12 NOTE — Progress Notes (Signed)
Synopsis: Referred by Bernadette Hoit, MD for post-covid 19 dyspnea  Subjective:   PATIENT ID: Ryan Glass GENDER: male DOB: 12-Jan-1956, MRN: [id number]  HPI  Chief Complaint  Patient presents with   Follow-up    3 mo f/u. States his breathing has not changed since last visit. Not currently using O2 but still wearing cpap machine.    Ryan Glass is a 66 year old male, never smoker with hypertension, rheumatoid arthritis and obstructive sleep apnea on CPAP who returns to pulmonary clinic shortness of breath.   He recently had right knee arthorplasty complicated by sepsis presentation requiring admission 7/12. He has been feeling ok since discharge. He continues to have significant pain of his right knee. He had appointment with his surgical team today.   He is using albuterol twice daily - unkown response. He continue to take montelukast. No current issues with his breathing.   OV 10/31/21 He continues to have exertional shortness of breath. He has not started cardiopulmonary rehab yet. He is having a hard time being able to complete his work as he returned recently. He complains of being tired and fatiguing easily.   He remains on advair 100-16mcg 1 puff twice daily as he reports benefit from the inhaler.   He remains off supplemental oxygen at this time.   He has not had CPAP titration study.  OV 09/28/21 Patient is feeling better since last visit and he has been transition to sotalol therapy and remains off carvedilol and diltiazem Glass to bradycardia.  He has been followed closely by his cardiology team and seems to be in a better spot at this time regarding his cardiac function.  He continues to have shortness of breath with exertion though.  He has been taken off supplemental oxygen therapy based on his recent clinic checks with no desaturations upon ambulation.  OV 09/05/21 He was admitted in June 2022 for progressive dyspnea and chest pain where he was found to have a  segmental pulmonary emboli after traveling to [place] and having dental implant surgery done. He was treated with heparin and then transitioned to eliquis. He was discharged home on supplemental oxygen as he had desaturations in his SpO2 at that time.   Echo 05/26/21 showed LVEF 45-50% (down from 60-65% 02/08/21) with severe asymetric left ventricular hypertrophy. Grade I diastolic dysfunction. RV systolic function is moderately reduced and RV size is moderately enlarged with RVSP 50.63mmHg. His echo on 02/08/21 previously showed normal RV function and mildly enlarged size. There is dilation in the RA and LA on both the 6/17 and 3/2 echos.   LHC 05/29/2021 showed minimal CAD and RHC showed normal pressures. RV 26/1, PA 25/4, PA mean 14, PW mean 12, LVEDP 8.  HRCT Chest on 06/07/21 shows band like scarring or partial atelectasis of the dependent lungs which is unchanged from prior studies. No air trapping noted. No subpleural reticulation noted.   PFTs 11/2020 normal ratio, no airway obstruction, no restriction, DLCO 71%. Repeat PFTs 08/01/21 show normal ratio, no obstruction or restriction and DLCO 89%.   He again had oxygen desaturations and required 2L of supplemental oxygen with ambulation at last visit on 07/20/21.   He was admitted 08/02/21 for concerns of ventricular trachycardia noted on his Zio extended cardiac monitor. TTE bubble study on 08/03/21 showed positive shunting within 3-6 cardiac cycles suggestive of interatrial shunt. The echo was also concerning for hypertrophic cardiomyopathy. He under went ICD placement on 08/07/21. He was discharged home with carvedilol  and verapamil for rate control medications.  Discussions were held between his cardiology team and myself regarding his dyspnea out of proportion to his testing results in regards to his HRCT chest scan and recent PFTs. We decided to move forward with closure of his PFO as he had shunt activity noted on TTE and TEE.  Today in clinic patient  did not have desaturations in his SpO2 which is similar to Dr. Antionette Char findings in clinic on 08/24/21. The patient walked a total of 5 laps without decline in his O2 today. Patient's heart rate did drop into the 40s with increased activity. Patient has HR, BP and SpO2 diary which indicates he has had a heart rate in the 30-40s since 9/16-9/17. He does complain of dizziness and feeling light headed at times.   Past Medical History:  Diagnosis Date   AICD (automatic cardioverter/defibrillator) present    Ascending aortic aneurysm ([place])    a. CT 05/2021 showed 4.4x4.4cm TAA. Stable on chest CTA 12/2021 at 4.4cm.   Atrial fibrillation (HCC)    Bilateral lower extremity edema    Celiac artery dilatation (HCC)    1.8cm at the celiac trunk on Chest CT 12/2021   CHF (congestive heart failure) (HCC)    Chronic gout without tophus    followed by dr Gerilyn Nestle    (06-08-2020  per pt last episode right knee 3 wks ago)   CKD (chronic kidney disease), stage II    nephrology--- Jen Mow PA (10-29-2019 note in epic scanned in  media)   Coronary artery disease    Heart failure with mid-range ejection fraction ([place]) 05/27/2021   05/26/21: Left ventricular ejection fraction, by estimation, is 45 to 50%. There is severe asymmetric left ventricular hypertrophy. G1DD present.    History of COVID-19 06/07/2021   HOCM (hypertrophic obstructive cardiomyopathy) ([place])    s/p ICD 07/2021   Hypertension    followed by cardiology, dr t. turner   (05-14-2018 nuclear study in epic , normal perfusion with nuclear ef 61%)   Left hydrocele    Low serum potassium level 04/27/2014   OSA on CPAP    per pt uses every night   Palpitations followed by dr t. [name]   06-08-2020  still feels palipations Glass to PVCs when exertion but not with chest pain/ discomfort   PVC's (premature ventricular contractions) cardiologist--- dr t. turner   Status post PVC ablation by [name] 2019 with recurrence of frequent PVCs/bigeminy;   prior pseudobradycardia r/t pvcs   Rheumatoid arthritis involving multiple sites [facility])    rheumotology--- dr a. Gerilyn Nestle  ([facility] in [place])     Family History  Problem Relation Age of Onset   Cardiomyopathy Mother    Heart attack Father      Social History   Socioeconomic History   Marital status: Married    Spouse name: Not on file   Number of children: Not on file   Years of education: Not on file   Highest education level: Not on file  Occupational History   Not on file  Tobacco Use   Smoking status: Never   Smokeless tobacco: Never  Vaping Use   Vaping Use: Never used  Substance and Sexual Activity   Alcohol use: Never   Drug use: Never   Sexual activity: Not on file  Other Topics Concern   Not on file  Social History Narrative   Not on file   Social Determinants of Health   Financial Resource Strain: Not on  file  Food Insecurity: Not on file  Transportation Needs: Not on file  Physical Activity: Not on file  Stress: Not on file  Social Connections: Not on file  Intimate Partner Violence: Not on file     Allergies  Allergen Reactions   Amlodipine Besylate Swelling and Other (See Comments)    Leg swelling with 10 mg daily am dosing; decreased with BID 5 mg dosing   Aspirin Itching     Outpatient Medications Prior to Visit  Medication Sig Dispense Refill   Abatacept (ORENCIA CLICKJECT) 0000000 MG/ML SOAJ Inject 125 mg into the skin every Sunday.     albuterol (VENTOLIN HFA) 108 (90 Base) MCG/ACT inhaler Inhale 2 puffs into the lungs every 4 (four) hours as needed for wheezing or shortness of breath. 8 g 1   allopurinol (ZYLOPRIM) 100 MG tablet Take 100 mg by mouth daily.     apixaban (ELIQUIS) 2.5 MG TABS tablet Take 1 tablet (2.5 mg total) by mouth 2 (two) times daily. Restart normal 5 mg dose after 2 weeks 30 tablet 0   apixaban (ELIQUIS) 5 MG TABS tablet Take 5 mg by mouth 2 (two) times daily.     atorvastatin (LIPITOR) 40 MG tablet Take 1 tablet (40 mg  total) by mouth daily. 90 tablet 0   benzonatate (TESSALON) 200 MG capsule Take 1 capsule (200 mg total) by mouth 3 (three) times daily as needed for up to 10 days for cough. 30 capsule 0   docusate sodium (COLACE) 100 MG capsule Take 1 capsule (100 mg total) by mouth every 12 (twelve) hours. 60 capsule 0   ferrous sulfate 325 (65 FE) MG tablet Take 325 mg by mouth daily.     fluticasone (FLONASE) 50 MCG/ACT nasal spray Place 1 spray into both nostrils daily. (Patient taking differently: Place 1 spray into both nostrils daily as needed for allergies.)     folic acid (FOLVITE) 1 MG tablet Take 1 mg by mouth daily.     furosemide (LASIX) 40 MG tablet TAKE ONE TABLET BY MOUTH DAILY FOR THREE DAYS **THEN TAKE AS NEEDED FOR INCREASE WEIGHT GAIN** (Patient taking differently: Take 40 mg by mouth daily as needed for fluid or edema (weight gain).) 90 tablet 3   gabapentin (NEURONTIN) 600 MG tablet Take 600 mg by mouth 3 (three) times daily.     hydrALAZINE (APRESOLINE) 100 MG tablet Take 100 mg by mouth 3 (three) times daily.     loratadine (CLARITIN) 10 MG tablet Take 10 mg by mouth daily.     Magnesium Oxide 400 MG CAPS Take 1 capsule (400 mg total) by mouth every other day. While taking daily multivitamin with 100mg  magnesium every day 90 capsule 3   montelukast (SINGULAIR) 10 MG tablet Take 1 tablet (10 mg total) by mouth at bedtime. 90 tablet 3   Multiple Vitamin (MULTIVITAMIN WITH MINERALS) TABS tablet Take 1 tablet by mouth daily.     oxyCODONE (OXY IR/ROXICODONE) 5 MG immediate release tablet Take 1 tablet (5 mg total) by mouth 4 (four) times daily as needed for moderate pain. 30 tablet 0   polyethylene glycol (MIRALAX) 17 g packet Take 17 g by mouth 2 (two) times daily. 14 each 0   potassium chloride SA (KLOR-CON M) 20 MEQ tablet Take 1 tablet by  mouth only when you have to use a Lasix (Patient taking differently: Take 20 mEq by mouth daily as needed (with each dose of Lasix/furosemide).) 90 tablet  3   predniSONE (  DELTASONE) 5 MG tablet Take 5 mg by mouth daily with breakfast.     Sennosides (EX-LAX PO) Take 1 tablet by mouth daily as needed (constipation).     sotalol (BETAPACE) 120 MG tablet Take 1 tablet (120 mg total) by mouth every 12 (twelve) hours. 60 tablet 8   spironolactone (ALDACTONE) 25 MG tablet TAKE ONE TABLET BY MOUTH DAILY (Patient taking differently: Take 25 mg by mouth daily.) 90 tablet 2   Vitamin D, Ergocalciferol, (DRISDOL) 1.25 MG (50000 UNIT) CAPS capsule TAKE ONE CAPSULE BY MOUTH ONCE WEEKLY (Patient taking differently: Take 50,000 Units by mouth every Monday.) 12 capsule 0   colchicine 0.6 MG tablet Take 1 tablet (0.6 mg total) by mouth daily for 10 days. 10 tablet 0   No facility-administered medications prior to visit.   Review of Systems  Constitutional:  Negative for chills, fever, malaise/fatigue and weight loss.  HENT:  Negative for congestion and sore throat.   Eyes: Negative.   Respiratory:  Positive for shortness of breath. Negative for cough, hemoptysis, sputum production and wheezing.   Cardiovascular:  Negative for chest pain, palpitations, orthopnea, claudication and leg swelling.  Gastrointestinal:  Negative for abdominal pain, heartburn, nausea and vomiting.  Genitourinary: Negative.   Musculoskeletal: Negative.   Neurological:  Negative for dizziness, weakness and headaches.  Endo/Heme/Allergies: Negative.   Psychiatric/Behavioral: Negative.      Objective:   Vitals:   07/12/22 1618  BP: 120/70  Pulse: 82  SpO2: 95%  Weight: 227 lb 6.4 oz (103.1 kg)  Height: 5\' 8"  (1.727 m)    Physical Exam Constitutional:      General: He is not in acute distress.    Appearance: He is obese.  HENT:     Head: Normocephalic and atraumatic.  Cardiovascular:     Rate and Rhythm: Normal rate and regular rhythm.     Pulses: Normal pulses.     Heart sounds: Normal heart sounds. No murmur heard. Pulmonary:     Effort: Pulmonary effort is normal.      Breath sounds: Normal breath sounds. No wheezing, rhonchi or rales.  Musculoskeletal:     Right lower leg: Edema (2+) present.     Left lower leg: Edema present.  Skin:    General: Skin is warm and dry.  Neurological:     General: No focal deficit present.     Mental Status: He is alert.  Psychiatric:        Mood and Affect: Mood normal.        Behavior: Behavior normal.        Thought Content: Thought content normal.        Judgment: Judgment normal.     CBC    Component Value Date/Time   WBC 10.6 (H) 06/28/2022 0922   RBC 3.58 (L) 06/28/2022 0922   HGB 10.9 (L) 06/28/2022 0922   HGB 14.0 03/22/2022 0900   HCT 33.3 (L) 06/28/2022 0922   HCT 41.4 03/22/2022 0900   PLT 306 06/28/2022 0922   PLT 165 03/22/2022 0900   MCV 93.0 06/28/2022 0922   MCV 92 03/22/2022 0900   MCH 30.4 06/28/2022 0922   MCHC 32.7 06/28/2022 0922   RDW 13.9 06/28/2022 0922   RDW 13.2 03/22/2022 0900   LYMPHSABS 1.5 06/28/2022 0922   LYMPHSABS 1.6 03/22/2022 0900   MONOABS 0.7 06/28/2022 0922   EOSABS 0.0 06/28/2022 0922   EOSABS 0.1 03/22/2022 0900   BASOSABS 0.0 06/28/2022 0922   BASOSABS 0.1 03/22/2022  0900      Latest Ref Rng & Units 06/28/2022    9:22 AM 06/27/2022    4:38 AM 06/26/2022    4:31 AM  BMP  Glucose 70 - 99 mg/dL 742  595  638   BUN 8 - 23 mg/dL 34  40  28   Creatinine 0.61 - 1.24 mg/dL 7.56  4.33  2.95   Sodium 135 - 145 mmol/L 136  140  135   Potassium 3.5 - 5.1 mmol/L 3.5  3.7  4.1   Chloride 98 - 111 mmol/L 108  107  106   CO2 22 - 32 mmol/L 19  21  21    Calcium 8.9 - 10.3 mg/dL 8.8  9.5  8.9    Chest imaging: HRCT Chest 06/07/21 1. Bland appearing, bandlike scarring and or partial atelectasis of the dependent lungs, unchanged compared to prior examination. No evidence of fibrotic interstitial lung disease. No significant air trapping on expiratory phase imaging. 2. Cardiomegaly and coronary artery disease. 3. Unchanged enlargement of the tubular ascending  thoracic aorta, measuring up to 4.4 x 4.4 cm. Recommend annual imaging followup by CTA or MRA if not otherwise imaged.  CXR 07/12/20 There are low lung volumes with central vascular crowding and additional bandlike opacities in the periphery of the left mid lung which could reflect subsegmental atelectasis or scarring. More patchy right infrahilar opacity with airways thickening is present. No pneumothorax or visible effusion. Cardiomediastinal contours are unremarkable. No acute osseous or soft tissue abnormality. Degenerative changes are present in the imaged spine and shoulders. Telemetry leads overlie the chest.  PFT:    Latest Ref Rng & Units 08/01/2021    2:57 PM 11/16/2020   10:00 AM  PFT Results  FVC-Pre L 3.75  4.14   FVC-Predicted Pre % 84  92   FVC-Post L 3.65  4.01   FVC-Predicted Post % 82  89   Pre FEV1/FVC % % 78  82   Post FEV1/FCV % % 82  85   FEV1-Pre L 2.91  3.38   FEV1-Predicted Pre % 87  101   FEV1-Post L 2.99  3.40   DLCO uncorrected ml/min/mmHg 23.24  18.55   DLCO UNC% % 89  71   DLCO corrected ml/min/mmHg 25.93  18.55   DLCO COR %Predicted % 99  71   DLVA Predicted % 100  86   TLC L 6.95  7.23   TLC % Predicted % 102  106   RV % Predicted % 126  121   PFT 2021: Mild diffusion defect.  PFT 2022: within normal limits  Echo:  08/18/21 - TEE LVEF 55-60%. Mild left ventricular hypertrophy. RV systolic function is moderately reduced. RV is moderately enlarged. Atrial level shunt noted by color flow and agitated saline. RA mildly dilated.   05/26/21 LVEF 45 to 50%.  Severe asymmetric left ventricular hypertrophy.  Grade 1 diastolic dysfunction.  RV systolic function is moderately reduced.  RV size is moderately enlarged.  Moderately elevated pulmonary arterial systolic pressure.  Left atrium size is moderately dilated.  Right atrial size is mildly dilated.  02/08/21 LVEF 60-65%. Moderate LVH. RV systolic function is normal. RV is mildly enlarged.       Assessment & Plan:   OSA (obstructive sleep apnea) - Plan: Cpap titration  Pulmonary embolism, other, unspecified chronicity, unspecified whether acute cor pulmonale present (HCC)  Mild persistent asthma, unspecified whether complicated  Discussion: Ryan Glass is a 66 year old male, never smoker with  hypertension, rheumatoid arthritis reactive airways disease and obstructive sleep apnea on CPAP who returns to pulmonary clinic for shortness of breath.   His respiratory status appears stable at this time with as needed albuterol and montelukast daily.   He needs to have a CPAP titration study performed to ensure he is on adequate sleep apnea treatment. Order has been placed.  Follow up in 6 months.  [name], MD [facility] Office: [phone]     Current Outpatient Medications:    Abatacept (ORENCIA CLICKJECT) 0000000 MG/ML SOAJ, Inject 125 mg into the skin every Sunday., Disp: , Rfl:    albuterol (VENTOLIN HFA) 108 (90 Base) MCG/ACT inhaler, Inhale 2 puffs into the lungs every 4 (four) hours as needed for wheezing or shortness of breath., Disp: 8 g, Rfl: 1   allopurinol (ZYLOPRIM) 100 MG tablet, Take 100 mg by mouth daily., Disp: , Rfl:    apixaban (ELIQUIS) 2.5 MG TABS tablet, Take 1 tablet (2.5 mg total) by mouth 2 (two) times daily. Restart normal 5 mg dose after 2 weeks, Disp: 30 tablet, Rfl: 0   apixaban (ELIQUIS) 5 MG TABS tablet, Take 5 mg by mouth 2 (two) times daily., Disp: , Rfl:    atorvastatin (LIPITOR) 40 MG tablet, Take 1 tablet (40 mg total) by mouth daily., Disp: 90 tablet, Rfl: 0   benzonatate (TESSALON) 200 MG capsule, Take 1 capsule (200 mg total) by mouth 3 (three) times daily as needed for up to 10 days for cough., Disp: 30 capsule, Rfl: 0   docusate sodium (COLACE) 100 MG capsule, Take 1 capsule (100 mg total) by mouth every 12 (twelve) hours., Disp: 60 capsule, Rfl: 0   ferrous sulfate 325 (65 FE) MG tablet, Take 325 mg by mouth  daily., Disp: , Rfl:    fluticasone (FLONASE) 50 MCG/ACT nasal spray, Place 1 spray into both nostrils daily. (Patient taking differently: Place 1 spray into both nostrils daily as needed for allergies.), Disp: , Rfl:    folic acid (FOLVITE) 1 MG tablet, Take 1 mg by mouth daily., Disp: , Rfl:    furosemide (LASIX) 40 MG tablet, TAKE ONE TABLET BY MOUTH DAILY FOR THREE DAYS **THEN TAKE AS NEEDED FOR INCREASE WEIGHT GAIN** (Patient taking differently: Take 40 mg by mouth daily as needed for fluid or edema (weight gain).), Disp: 90 tablet, Rfl: 3   gabapentin (NEURONTIN) 600 MG tablet, Take 600 mg by mouth 3 (three) times daily., Disp: , Rfl:    hydrALAZINE (APRESOLINE) 100 MG tablet, Take 100 mg by mouth 3 (three) times daily., Disp: , Rfl:    loratadine (CLARITIN) 10 MG tablet, Take 10 mg by mouth daily., Disp: , Rfl:    Magnesium Oxide 400 MG CAPS, Take 1 capsule (400 mg total) by mouth every other day. While taking daily multivitamin with 100mg  magnesium every day, Disp: 90 capsule, Rfl: 3   montelukast (SINGULAIR) 10 MG tablet, Take 1 tablet (10 mg total) by mouth at bedtime., Disp: 90 tablet, Rfl: 3   Multiple Vitamin (MULTIVITAMIN WITH MINERALS) TABS tablet, Take 1 tablet by mouth daily., Disp: , Rfl:    oxyCODONE (OXY IR/ROXICODONE) 5 MG immediate release tablet, Take 1 tablet (5 mg total) by mouth 4 (four) times daily as needed for moderate pain., Disp: 30 tablet, Rfl: 0   polyethylene glycol (MIRALAX) 17 g packet, Take 17 g by mouth 2 (two) times daily., Disp: 14 each, Rfl: 0   potassium chloride SA (KLOR-CON M) 20 MEQ  tablet, Take 1 tablet by  mouth only when you have to use a Lasix (Patient taking differently: Take 20 mEq by mouth daily as needed (with each dose of Lasix/furosemide).), Disp: 90 tablet, Rfl: 3   predniSONE (DELTASONE) 5 MG tablet, Take 5 mg by mouth daily with breakfast., Disp: , Rfl:    Sennosides (EX-LAX PO), Take 1 tablet by mouth daily as needed (constipation)., Disp: ,  Rfl:    sotalol (BETAPACE) 120 MG tablet, Take 1 tablet (120 mg total) by mouth every 12 (twelve) hours., Disp: 60 tablet, Rfl: 8   spironolactone (ALDACTONE) 25 MG tablet, TAKE ONE TABLET BY MOUTH DAILY (Patient taking differently: Take 25 mg by mouth daily.), Disp: 90 tablet, Rfl: 2   Vitamin D, Ergocalciferol, (DRISDOL) 1.25 MG (50000 UNIT) CAPS capsule, TAKE ONE CAPSULE BY MOUTH ONCE WEEKLY (Patient taking differently: Take 50,000 Units by mouth every Monday.), Disp: 12 capsule, Rfl: 0   colchicine 0.6 MG tablet, Take 1 tablet (0.6 mg total) by mouth daily for 10 days., Disp: 10 tablet, Rfl: 0

## 2022-07-12 NOTE — Patient Instructions (Signed)
We will schedule you for a CPAP titration study  Recommend using CPAP for at least 4 hours every night  Continue montelukast daily and as needed albuterol inhaler  Follow up in 6 months

## 2022-07-13 ENCOUNTER — Encounter: Payer: Self-pay | Admitting: Pulmonary Disease

## 2022-07-13 NOTE — Telephone Encounter (Signed)
Reviewed Dr Francine Graven note OV this week CPAP titration study ordered/stable condition and follow up 6 months.

## 2022-07-20 ENCOUNTER — Ambulatory Visit (HOSPITAL_BASED_OUTPATIENT_CLINIC_OR_DEPARTMENT_OTHER): Payer: No Typology Code available for payment source | Attending: Pulmonary Disease | Admitting: Pulmonary Disease

## 2022-07-20 DIAGNOSIS — G4733 Obstructive sleep apnea (adult) (pediatric): Secondary | ICD-10-CM

## 2022-07-21 ENCOUNTER — Encounter: Payer: Self-pay | Admitting: Registered Nurse

## 2022-07-21 ENCOUNTER — Telehealth: Payer: Self-pay | Admitting: Registered Nurse

## 2022-07-21 DIAGNOSIS — M7989 Other specified soft tissue disorders: Secondary | ICD-10-CM

## 2022-07-21 DIAGNOSIS — R0609 Other forms of dyspnea: Secondary | ICD-10-CM

## 2022-07-21 NOTE — Telephone Encounter (Signed)
See tcon dated 07/21/22 patient stated he slept in sleep study lab last night awaiting results.  Received new face mask that doesn't leak as much as his old one.  Had appt with PCM 7 Aug, labs completed still anemic, uric acid, serum creatinine and electrolytes/normal.  GFR less than 60 (59).   Reviewed PCM lab results in detail with patient Epic review as he stated some questions after he had looked at [employer].   Home sp02 has been in low 90s sometimes high 80s per patient typically after waking up from nap.  Not using albuterol or home oxygen.  Feeling well.  Had last PT appt in home today will be switching to clinic appts.  Knee still swelling/painful when walking.  PCM [name] has asked pateint to follow up with cardiology due to recent covid infection and hospitalization.  Patient still trying to decide if staying with [facility] Dr Mayford Knife or moving to [facility] as his daughter's encouraging him to move to [facility] provider.  Device check appt with [place] scheduled 08/08/22.  Discussed with patient sleep study results still not available in Epic.  Encouraged patient to schedule cardiology follow up.  Patient stated otherwise feeling well denied new concerns. Encouraged patient to use home CPAP during naps, albuterol if sp02 less than 90% and home oxygen if sp02 does not increase to greater than 90% after albuterol use.  Patient to follow up with Dr Francine Graven pulmonology if sp02 consistently lower than 90% per PCM. Patient verbalized understanding of information/instructions, agreed with plan of care and had no further questions at this time. Patient A&Ox3 spoke full sentences without difficulty; no audible wheeze/cough/congestion/throat clearing or shortness of breath with speech.  Duration of telephone call 16 minutes.

## 2022-07-21 NOTE — Telephone Encounter (Signed)
See tcon dated 07/21/22 patient stated he slept in sleep study lab last night awaiting results.  Received new face mask that doesn't leak as much as his old one.  Had appt with PCM 7 Aug, labs completed still anemic, uric acid, serum creatinine and electrolytes/normal.  GFR less than 60 (59).   Reviewed PCM lab results in detail with patient Epic review as he stated some questions after he had looked at [employer].   Home sp02 has been in low 90s sometimes high 80s per patient typically after waking up from nap.  Not using albuterol or home oxygen.  Feeling well.  Had last PT appt in home today will be switching to clinic appts.  Knee still swelling/painful when walking.  PCM [name] has asked pateint to follow up with cardiology due to recent covid infection and hospitalization.  Patient still trying to decide if staying with [facility] Dr Mayford Knife or moving to [facility] as his daughter's encouraging him to move to [facility] provider.  Device check appt with Walker scheduled 08/08/22.  Discussed with patient sleep study results still not available in Epic.  Encouraged patient to schedule cardiology follow up.  Patient stated otherwise feeling well denied new concerns.  Patient verbalized understanding of information/instructions, agreed with plan of care and had no further questions at this time. Patient A&Ox3 spoke full sentences without difficulty; no audible wheeze/cough/congestion/throat clearing or shortness of breath with speech.  Duration of telephone call 16 minutes.

## 2022-07-28 NOTE — Telephone Encounter (Signed)
Patient contacted via telephone.  Stated has been moving into new house moving from 3 level to 1 level home the past 2 days.  Feeling fatigued and short of breath today with activity/exertion.  Sp02 94% RA on check during telephone call.  Patient A&Ox3 spoke full sentences without difficulty no audible cough/wheeze.  Discussed with patient can use albuterol 2 puffs po q4-6h prn cough/wheeze/dyspnea today also.  Discussed wearing CPAP if taking nap this afternoon.  Patient reported daughters came to help he and wife move.  Knee a little swollen today from all the lifting/carrying/walking.  Applied ice and going to rest it this afternoon.  Reminded patient could be still recovering from covid also.  Patient remembered after last covid infection he did have shortness of breath with exertion for quite a while after covid infection.  Patient stated he will follow up with Dr Cindi Carbon office for sleep study results and to ensure his machine updated to new settings.  He is aware that one of his machines can be updated via wifi but the other cannot.  Discussed sleep study results with patient that when in REM sleep sp02 dropping to 82 and HR 147-172  That 19cm H20 was reported as level that kept snoring resolved and sp02 level the best when in REM sleep.  Patient verbalized understanding information/instructions, agreed with plan of care and had no further questions at this time.

## 2022-07-31 DIAGNOSIS — G4733 Obstructive sleep apnea (adult) (pediatric): Secondary | ICD-10-CM | POA: Diagnosis not present

## 2022-07-31 NOTE — Addendum Note (Signed)
Addended by: Phillips Grout on: 07/31/2022 02:14 PM   Modules accepted: Orders

## 2022-07-31 NOTE — Procedures (Signed)
     Patient Name: Ryan Glass, Ryan Glass Date: 07/20/2022 Gender: Male D.O.B: 02/22/1956 Age (years): 66 Referring Provider: Melody Comas Height (inches): 68 Interpreting Physician: Coralyn Helling MD, ABSM Weight (lbs): 223 RPSGT: Armen Pickup BMI: 34 MRN: [id number] Neck Size: 17.50  CLINICAL INFORMATION The patient is referred for a CPAP titration to treat sleep apnea.  SLEEP STUDY TECHNIQUE As per the AASM Manual for the Scoring of Sleep and Associated Events v2.3 (April 2016) with a hypopnea requiring 4% desaturations.  The channels recorded and monitored were frontal, central and occipital EEG, electrooculogram (EOG), submentalis EMG (chin), nasal and oral airflow, thoracic and abdominal wall motion, anterior tibialis EMG, snore microphone, electrocardiogram, and pulse oximetry. Continuous positive airway pressure (CPAP) was initiated at the beginning of the study and titrated to treat sleep-disordered breathing.  MEDICATIONS Medications self-administered by patient taken the night of the study : N/A  TECHNICIAN COMMENTS Comments added by technician: Pt had one restroom visted Comments added by scorer: N/A  RESPIRATORY PARAMETERS Optimal PAP Pressure (cm): 17 AHI at Optimal Pressure (/hr): 3.4 Overall Minimal O2 (%): 82.0 Supine % at Optimal Pressure (%): 100 Minimal O2 at Optimal Pressure (%): 93.0   SLEEP ARCHITECTURE The study was initiated at 9:39:52 PM and ended at 4:22:57 AM.  Sleep onset time was 5.0 minutes and the sleep efficiency was 95.6%%. The total sleep time was 385.5 minutes.  The patient spent 0.8%% of the night in stage N1 sleep, 69.4%% in stage N2 sleep, 1.8%% in stage N3 and 28% in REM.Stage REM latency was 62.0 minutes  Wake after sleep onset was 12.6. Alpha intrusion was absent. Supine sleep was 89.01%.  CARDIAC DATA The 2 lead EKG demonstrated sinus rhythm, pacemaker generated. The mean heart rate was 81.2 beats per minute. Other EKG findings  include: None.  LEG MOVEMENT DATA The total Periodic Limb Movements of Sleep (PLMS) were 0. The PLMS index was 0.0. A PLMS index of <15 is considered normal in adults.  IMPRESSIONS - He did well with CPAP 17 cm H2O. - He did not require supplemental oxygen during this study.  DIAGNOSIS - Obstructive Sleep Apnea (G47.33)  RECOMMENDATIONS - Trial of CPAP therapy on 17 cm H2O with a X-Large size Resmed Full Face Mirage Quattro mask and heated humidification. - Avoid alcohol, sedatives and other CNS depressants that may worsen sleep apnea and disrupt normal sleep architecture. - Sleep hygiene should be reviewed to assess factors that may improve sleep quality. - Weight management and regular exercise should be initiated or continued.  [Electronically signed] 07/31/2022 08:31 AM  Coralyn Helling MD, ABSM Diplomate, American Board of Sleep Medicine NPI: [id number]  [facility] PH: [phone]   FX: [phone] ACCREDITED BY THE AMERICAN ACADEMY OF SLEEP MEDICINE

## 2022-08-03 ENCOUNTER — Telehealth: Payer: Self-pay | Admitting: Pulmonary Disease

## 2022-08-03 NOTE — Telephone Encounter (Signed)
Dr Francine Graven, pt is asking about his sleep study results. He has already looked at the results in Mychart and wants his CPAP pressure changed. I advised will call with results and recommendations once the study has been reviewed. Please advise, thanks!

## 2022-08-05 NOTE — Telephone Encounter (Signed)
Patient contacted via telephone.  Stated has been moving smaller items from old house to new house this week.  Still waiting for official sleep study results from Dr Francine Graven office.  CPAP settings haven't been updated yet per patient.  He was told by office staff doctor would contact him in 2 weeks from sleep study date which was 07/20/22.  Patient stated he has been using his albuterol inhaler 2 puffs am and prior to bed.  He is wearing his cpap to sleep.  Not using home oxygen.  Having gout symptoms in his foot again some swelling.   Denied having increased amount purine rich foods this week. Discussed low purine diet, hydrating with water, applying ice to knee if swollen  and elevating foot when sitting.  Discussed with heat index over 100 this week multiple days, exertion/moving personal items from one house to new house most likely mildly dehydrated.   If knee swelling with increased activity can affect circulation distal to knee also e.g. in foot.   Avoid purine rich foods this week.  Continue taking his medications as prescribed.  Sp02 RA per patient home monitor 87-92% while talking on phone.  Patient had not used albuterol since this am discussed to use again now 2 puffs.  Rest.  Patient able to speak full sentences without difficulty but noted having pauses between every couple of words to take breath.  No audible wheeze/throat clearing/congestion/cough during 7 minute telephone call.  Patient A&Ox3.  Patient verbalized understanding information/instructions, agreed with plan of care and had no further questions at this time.

## 2022-08-06 ENCOUNTER — Encounter: Payer: Self-pay | Admitting: *Deleted

## 2022-08-06 NOTE — Telephone Encounter (Signed)
Spoke with the pt and notified of results/recs per JD. He verbalized understanding. Order already placed. I sent him [employer]'s phone number in Mychart msg per his request. Nothing further needed.

## 2022-08-06 NOTE — Telephone Encounter (Signed)
Patient did best with CPAP pressure of 17 cmH2O. I believe I messaged April or triage team to send in updated orders to his DME for pressure settings, mask (X-Large size Resmed Full Face Mirage Quattro mask) and heated humidification. If I am mistaken, please send in orders for the above.  Thanks, JD

## 2022-08-08 ENCOUNTER — Ambulatory Visit (INDEPENDENT_AMBULATORY_CARE_PROVIDER_SITE_OTHER): Payer: No Typology Code available for payment source

## 2022-08-08 DIAGNOSIS — I421 Obstructive hypertrophic cardiomyopathy: Secondary | ICD-10-CM | POA: Diagnosis not present

## 2022-08-08 LAB — CUP PACEART REMOTE DEVICE CHECK
Battery Remaining Longevity: 71 mo
Battery Remaining Percentage: 83 %
Battery Voltage: 2.98 V
Brady Statistic AP VP Percent: 56 %
Brady Statistic AP VS Percent: 41 %
Brady Statistic AS VP Percent: 1 %
Brady Statistic AS VS Percent: 1.7 %
Brady Statistic RA Percent Paced: 96 %
Brady Statistic RV Percent Paced: 57 %
Date Time Interrogation Session: 20230830020657
HighPow Impedance: 64 Ohm
Implantable Lead Implant Date: 20220829
Implantable Lead Implant Date: 20220829
Implantable Lead Location: 753859
Implantable Lead Location: 753860
Implantable Pulse Generator Implant Date: 20220829
Lead Channel Impedance Value: 450 Ohm
Lead Channel Impedance Value: 450 Ohm
Lead Channel Pacing Threshold Amplitude: 0.75 V
Lead Channel Pacing Threshold Amplitude: 0.75 V
Lead Channel Pacing Threshold Pulse Width: 0.5 ms
Lead Channel Pacing Threshold Pulse Width: 0.5 ms
Lead Channel Sensing Intrinsic Amplitude: 12 mV
Lead Channel Sensing Intrinsic Amplitude: 3.3 mV
Lead Channel Setting Pacing Amplitude: 2 V
Lead Channel Setting Pacing Amplitude: 2.5 V
Lead Channel Setting Pacing Pulse Width: 0.5 ms
Lead Channel Setting Sensing Sensitivity: 0.5 mV
Pulse Gen Serial Number: 810029878

## 2022-08-09 ENCOUNTER — Telehealth: Payer: Self-pay | Admitting: Pulmonary Disease

## 2022-08-09 NOTE — Telephone Encounter (Signed)
Called and spoke with patient.  Patient stated he had a sleep study at [facility] sleep lab 07/24/22 and no one had called with results. Patient stated he was told during his test that he needed increased air flow.   Cpap titration results are in chart 07/23/22. Dr. Francine Graven has seen results and recommended increase pressure. DME order to [employer] was placed 07/31/22 for increased pressure.  Patient was made aware order was placed.  Nothing further at this time.

## 2022-08-10 ENCOUNTER — Telehealth: Payer: Self-pay | Admitting: Pulmonary Disease

## 2022-08-10 NOTE — Telephone Encounter (Signed)
Patient contacted via telephone stated still waiting for pulmonology to give him sleep study results and update his CPAP machine settings.  He stated he called provider's office and they stated he would receive a call.  Reviewed Epic noted new orders called to patient DME [facility] provider 28 Aug and patient to call DME provider.  Patient had not seen my chart message as still moving small items from old house to new house this week.  Patient stated last lasix dose 2 days ago.  Weight today 224 lbs.  Patient given DME provider's number from Dr Lanora Manis staff my chart message.  Instructed him to call now since holiday weekend.  Patient stated he knows his current cpap set at 8.  He was notified Dr Francine Graven new settings should be 17 by office staff.  Patient reported sp02 84-85% last night.  Using albuterol inhaler am and at bedtime.  Patient not sure what his level is today has not checked. Monitor not with him at current location unable to check with NP on phone. Shortness of breath with exertion all week.  Foot still swollen and with gout symptoms.  Patient reported partial bridge broke this week and having hard time eating/chewing.  Daughter got him chocolate protein shakes.  Discussed with patient to check nutrition information for shakes to ensure not high in sugar as could worsen gout symptoms.  Ensure drinking his usual 2 liters of water as exerting/sweating.  Discussed can put food in food processor or blender until his bridge fixed  Reminded patient to use albuterol inhaler every 4 hours if sp02 less than 90%/rest in air conditioning. If persistently less than 90% RA and not improving with plan of care he needs to seek re-evaluation with a provider in person. Discussed  to continue monitoring weight/leg swelling for prn lasix use this weekend.  Patient stated if napping/sleeping he is wearing cpap.  Does not have any portable oxygen at home any longer.  Patient verbalized understanding information/instructions,  agreed with plan of care and had no further questions at this time.  Patient A&Ox3 spoke full sentences without difficulty.  Speaking a few words taking breath continuing sentence today.  No audible cough/wheezing/congestion/throat clearing during 15 minute telephone call.

## 2022-08-10 NOTE — Telephone Encounter (Signed)
Just a FYI the order never hit our WQ somehow it went to the sleep center we will get it taken care of

## 2022-08-10 NOTE — Telephone Encounter (Signed)
He is aware we are working on this order for him.    Please advise on the order?

## 2022-08-15 ENCOUNTER — Telehealth: Payer: Self-pay | Admitting: Registered Nurse

## 2022-08-15 DIAGNOSIS — M7989 Other specified soft tissue disorders: Secondary | ICD-10-CM

## 2022-08-15 DIAGNOSIS — Z23 Encounter for immunization: Secondary | ICD-10-CM

## 2022-08-15 DIAGNOSIS — Z2821 Immunization not carried out because of patient refusal: Secondary | ICD-10-CM

## 2022-08-15 DIAGNOSIS — J4521 Mild intermittent asthma with (acute) exacerbation: Secondary | ICD-10-CM

## 2022-08-15 MED ORDER — ALBUTEROL SULFATE HFA 108 (90 BASE) MCG/ACT IN AERS: 2.0000 | INHALATION_SPRAY | RESPIRATORY_TRACT | 1 refills | Status: DC | PRN

## 2022-08-15 NOTE — Telephone Encounter (Signed)
Patient contacted via telephone follow up as sp02 RA less than 90% last week with and without activity.  Patient still awaiting updates to his CPAP unit after sleep study in August.  Does not have any portable oxygen at home.  Did have albuterol inhaler and was only using 2p BID am/hs.  Discussed to start prn use 2p q4h and to stay in air conditioning as much as possible. Patient A&Ox3 today spoke full sentences without difficulty.  No audible cough/congestion/throat clearing/shortness of breath.  Patient stated rhinitis resolved.  Sp02 yesterday 94 and 95% RA both checks.  Stated still moving boxes from old house to new house today but due to heat index was stopping at 1200 today.  His foot still swollen and painful with stairs.  Saw Orthopedics provider yesterday and not cleared to RTW due to swelling knee/foot.  Patient also started on tramadol yesterday for pain.  Patient stated estimated RTW 18 Oct now.  He sent his work note to [employer] and received reply from her she did receive it.  Patient stated needs refill on albuterol inhaler-electronic Rx sent to his pharmacy of choice albuterol HFA 179mcg/act 2p po q4h prn wheezing/SOB #8gm RF1 or formulary equivalent substitute.  Patient stated does not need any medication fills from [facility] has been filling all at [facility] e.g. lasix/potassium and not taking every day any longer.  Patient with questions on pneumonia vaccine, covid vaccine, and flu vaccine.  Patient last received his covid and flu vaccines at pharmacy but he was told no history of pneumonia vaccine at pharmacy and recommended.  Discussed with patient yes it is recommended for his age.  I reviewed his epic and [place] Immunizations registry and no pneumonia vaccines documented.  Patient notified to discuss with rheumatology/pulmonology if he should obtain pneumonia/flu/RSV first, second, same time and timing to best protect himself during variable peak seasons of each.  Discussed covid and flu cases are  increasing in ER this month per [facility] website reporting.  Discussed covid and flu available at same time once pharmacies have received stock for this year.  Patient hesitant to get covid booster as last booster he was hospitalized with heart problems the week following booster administration.  Discussed with patient Laural Benes and Laural Benes vaccine removed from [employer].  Novavax, pfizer and moderna new boosters expected later this month.  There has been some reports of adverse effects after booster vaccines but typically arm pain, fever, fatigue, body aches, headache most common.  Patient planning to not get his covid booster this year or at least not to get covid and flu vaccine at the same time again.  Flu annual booster is available at [facility] and many other locations (PCM office/pharmacies) and employer flu clinics starting Fridays in September onsite.  Patient reported he called [facility] DME 1 Sep and they are still waiting for order from Dr Lanora Manis office reviewed Epic and Dr Adine Madura were getting information to his DME provider  I recommended that patient contact DME provider again today to verify if new orders received.  Encouraged patient to stay in air conditioning this afternoon as air quality moderate and heat index 99 at this time.  Discussed tomorrow forecasted to be the same plan early morning or late evening work/going outside.  Hydrate.  Duration of call 17 minutes.  Patient verbalized understanding information/instructions, agreed with plan of care and had no further questions at this time.  [employer] handouts on pneumococcal vaccines/RSV prevention sent to patient my chart  account.

## 2022-08-25 ENCOUNTER — Encounter: Payer: Self-pay | Admitting: Registered Nurse

## 2022-08-25 ENCOUNTER — Telehealth: Payer: Self-pay | Admitting: Registered Nurse

## 2022-08-25 DIAGNOSIS — M7989 Other specified soft tissue disorders: Secondary | ICD-10-CM

## 2022-08-25 NOTE — Telephone Encounter (Signed)
Patient contacted via telephone stated doing better this week.  Has noticed when he puts on CPAP to watch TV and fall asleep he feels much better than if he doesn't put on CPAP until he wakes up in the middle of the night to use bathroom then put on CPAP for last 3-4 hours of sleep before getting up to start his day.  DME provider was able to update his machine settings this past week to new recommendations of Dr Erin Fulling (pulmonology).  Patient reported yesterday evening sp02 91-92% RA not dropping into 80s any longer.  Took lasix yesterday and today because weight 226lbs 14 Sep and 224 yesterday.  Today home weight 222lbs.  Has PT appts scheduled for this week.  Appt with PCM 28 Sep for foot swelling/redness/pain.  "Gout never went away since I was in the hospital"  Reviewed Epic care everywhere last xrays Feb 2023.    CLINICAL DATA:  Bilateral foot pain   EXAM:  LEFT FOOT - 2 VIEW; RIGHT FOOT - 2 VIEW   COMPARISON:  08/19/2020   FINDINGS:  Bones: No acute fracture or dislocation. Normal bone mineralization.  No periostitis. Bilateral plantar calcaneal spurs.   Joints: Normal alignment. No erosive changes. 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 Mild  osteoarthritis of the first MTP joint bilaterally. Periarticular  soft tissue swelling around the first MTP joint and fifth MTP joint  bilaterally.   Soft tissue: No soft tissue abnormality. No radiopaque foreign body.  No chondrocalcinosis.   IMPRESSION:  1. 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  2. Mild osteoarthritis of the first MTP joint bilaterally with  surrounding soft tissue swelling as can be seen with rheumatoid  arthritis versus a crystalline arthropathy.    Electronically Signed    By: Kathreen Devoid M.D.    On: 02/04/2022 07:11 Procedure Note  Joaquin Courts, MD - 02/04/2022  Formatting of this note might be different  from the original.  CLINICAL DATA:  Bilateral foot pain   EXAM:  LEFT FOOT - 2 VIEW; RIGHT FOOT - 2 VIEW   COMPARISON:  08/19/2020   FINDINGS:  Bones: No acute fracture or dislocation. Normal bone mineralization.  No periostitis. Bilateral plantar calcaneal spurs.   Joints: Normal alignment. No erosive changes. 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 Mild  osteoarthritis of the first MTP joint bilaterally. Periarticular  soft tissue swelling around the first MTP joint and fifth MTP joint  bilaterally.   Soft tissue: No soft tissue abnormality. No radiopaque foreign body.  No chondrocalcinosis.   IMPRESSION:  1. 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  2. Mild osteoarthritis of the first MTP joint bilaterally with  surrounding soft tissue swelling as can be seen with rheumatoid  arthritis versus a crystalline arthropathy.    Electronically Signed    By: Kathreen Devoid M.D.    On: 02/04/2022 07:11 Exam End: 02/02/22 09:43   Specimen Collected: 02/04/22 07:08 Last Resulted: 02/04/22 07:11  Received From: [place]  Result Received: 02/05/22 16:33   [place] to rule out DVT completed Jul 2023 and has been re-evaluated by his orthopedics and PCM providers since his hospitalization  Lower Venous DVT Study   Patient Name:  Ryan Glass  Date of Exam:   06/21/2022  Medical Rec #: [id number]       Accession #:  [id number]  Date of Birth: [birth year]        Patient Gender: M  Patient Age:   66 years  Exam Location:  [facility]  Procedure:      VAS [place] LOWER EXTREMITY VENOUS (DVT)  Referring Phys: JULIE HAVILAND    ---------------------------------------------------------------------------  -----     Indications: Pain.  Other Indications: S/p right total knee replacement.   Comparison Study: No previous exam noted.   Performing Technologist: Bobetta Lime BS, RVT      Examination Guidelines:  A complete evaluation includes B-mode imaging, spectral Doppler, color  Doppler,  and power Doppler as needed of all accessible portions of each vessel.  Bilateral  testing is considered an integral part of a complete examination. Limited  examinations for reoccurring indications may be performed as noted. The  reflux  portion of the exam is performed with the patient in reverse  Trendelenburg.       +---------+---------------+---------+-----------+----------+---------------  ----+  RIGHT    CompressibilityPhasicitySpontaneityPropertiesThrombus Aging        +---------+---------------+---------+-----------+----------+---------------  ----+  CFV      Full           Yes      Yes                                        +---------+---------------+---------+-----------+----------+---------------  ----+  SFJ      Full                                                               +---------+---------------+---------+-----------+----------+---------------  ----+  FV Prox  Full                                                               +---------+---------------+---------+-----------+----------+---------------  ----+  FV Mid   Full                                                               +---------+---------------+---------+-----------+----------+---------------  ----+  FV DistalFull                                                               +---------+---------------+---------+-----------+----------+---------------  ----+  PFV      Full                                                               +---------+---------------+---------+-----------+----------+---------------  ----+  POP      Full                                                               +---------+---------------+---------+-----------+----------+---------------  ----+  PTV                                                    Not well  visualized  +---------+---------------+---------+-----------+----------+---------------  ----+  PERO                                                  Not well  visualized  +---------+---------------+---------+-----------+----------+---------------  ----+          Right Technical Findings:  Unable to visualize the right calf veins well, cannot rule out DVT.     +----+---------------+---------+-----------+----------+--------------+  LEFTCompressibilityPhasicitySpontaneityPropertiesThrombus Aging  +----+---------------+---------+-----------+----------+--------------+  CFV Full           Yes      Yes                                  +----+---------------+---------+-----------+----------+--------------+                  Summary:  RIGHT:  - No evidence of deep vein thrombosis in the lower extremity. No indirect  evidence of obstruction proximal to the inguinal ligament.  - No cystic structure found in the popliteal fossa.  - Normal right where visualized well. Hematoma in the right popliteal  fossa, s/p right total knee replacement.     LEFT:  - No evidence of common femoral vein obstruction.      *See table(s) above for measurements and observations.   Electronically signed by Harold Barban MD on 06/23/2022 at 12:48:18 AM.         Final    Had appt with dentist this week to fix his bridge but screw that was holding bridge on one side broke and new screw ordered by dentist did not fit and referred to another office to assist patient.  He has been having banana chocolate protein shakes 2g sugar per serving as his main meals since dental appliance broke as very difficult for him to chew foods.  Discussed food options/preparation with patient  Home made soups with finely diced vegetables so you can control the salt content use herbs/mrs dash Scrambled eggs with finely diced vegetables Rice with ground meat/finely diced  vegetables Mashed potatoes ground meat/finely diced vegetables  If instapot or crock pot at home could leave vegetables/meat in there until very tender/minimal chewing required.    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   Ref Range & Units 1 mo ago  Uric Acid 2.5 - 8.0 MG/DL 7.3   Resulting Agency  [facility]  Specimen Collected: 07/16/22 09:31   Performed by: [place] Last Resulted: 07/16/22 11:39  Received From: [place]  [facility]  Result Received: 07/16/22 16:37    Ref Range & Units 1 mo ago  WBC 4.4 - 11.0 x 10*3/uL 6.7   RBC 4.50 - 5.90 x 10*6/uL 4.00 Low    Hemoglobin 14.0 - 17.5 G/DL 12.0 Low    Hematocrit 41.5 - 50.4 % 36.4 Low    MCV 80.0 - 96.0 FL 90.9   MCH 27.5 - 33.2 PG 30.1   MCHC 33.0 - 37.0 G/DL 33.1   RDW 12.3 - 17.0 % 15.7   Platelets 150 - 450 X 10*3/uL 226   MPV 6.8 - 10.2 FL 8.5   Neutrophil % % 75   Lymphocyte % % 16   Monocyte % % 8   Eosinophil % % 1   Basophil % % 1   Neutrophil Absolute 1.8 - 7.8 x 10*3/uL 5.0   Lymphocyte Absolute 1.0 - 4.8 x 10*3/uL 1.1   Monocyte Absolute 0.0 - 0.8 x 10*3/uL 0.5   Eosinophil Absolute 0.0 - 0.5 x 10*3/uL 0.0   Basophil Absolute 0.0 - 0.2 x 10*3/uL 0.1   Resulting Agency  [facility]  Specimen Collected: 07/16/22 09:31   Performed by: [place] Last Resulted: 07/16/22 11:29  Received From: [place]  Result Received: 07/16/22 16:37   Specimen:  Blood  Ref Range & Units 1 mo ago Comments  Sodium 135 - 146 MMOL/L 137    Potassium 3.5 - 5.3 MMOL/L 3.9    Chloride 98 - 110 MMOL/L 109    CO2 23 - 30 MMOL/L 22 Low     BUN 8 - 24 MG/DL 18    Glucose 70 - 99 MG/DL 81  Patients taking eltrombopag at doses >/= 100 mg daily may show falsely elevated values of 10% or greater.  Creatinine 0.50 - 1.50 MG/DL 1.33    Calcium 8.5 - 10.5 MG/DL 9.2    Total Protein  6.0 - 8.3 G/DL 6.2  Patients taking eltrombopag at doses >/= 100 mg daily may show falsely elevated values of 10% or greater.  Albumin  3.5 - 5.0 G/DL 3.8    Total Bilirubin 0.1 - 1.2 MG/DL 0.6  Patients taking eltrombopag at doses >/= 100 mg daily may show falsely elevated values of 10% or greater.  Alkaline Phosphatase 25 - 125 IU/L or U/L 93    AST (SGOT) 5 - 40 IU/L or U/L 19    ALT (SGPT) 5 - 50 IU/L or U/L 16    Anion Gap 4 - 14 MMOL/L 6    Est. GFR >=60 ML/Glass/1.73 M*2 59 Low   GFR estimated by CKD-EPI equations(NKF 2021).   "Recommend confirmation of Cr-based eGFR by using Cys-based eGFR and other filtration markers (if applicable) in complex cases and clinical decision-making, as needed."  [place]   Specimen Collected: 07/16/22 09:31   Performed by: [place] Last Resulted: 07/16/22 11:39  Received From: [place]  Result Received: 07/16/22 16:37   Patient breathing baseline effort during 10 minute telephone call.  Not having to pause after every couple of words.  Voice not hoarse.  No audible nasal sniffing or throat clearing.  Reinforced with patient to wear his CPAP each night and to put on before he starts watching TV if his wife is telling him he typically falls asleep within 5 minutes.  Discussed sleep study tachycardia noted when CPAP not at correct titration and why he  probably doesn't feel rested when falls asleep without CPAP at start of night.  Keep follow up appts as scheduled with his dental/orthopedics/PT/PCM providers.  Estimated RTW October per orthopedics at last follow up appt.  Patient had no further questions or concerns at this time.

## 2022-08-25 NOTE — Telephone Encounter (Signed)
See tcon dated 08/25/22 sp02 levels improved and DME provider updated CPAP settings.

## 2022-08-25 NOTE — Telephone Encounter (Signed)
See tcon 08/25/22 dated SPO2 levels consistently above 90% this week and CPAP machine settings updated by DME provider.

## 2022-08-30 NOTE — Progress Notes (Signed)
Remote ICD transmission.   

## 2022-09-03 ENCOUNTER — Other Ambulatory Visit: Payer: Self-pay | Admitting: Registered Nurse

## 2022-09-03 DIAGNOSIS — E559 Vitamin D deficiency, unspecified: Secondary | ICD-10-CM

## 2022-09-04 ENCOUNTER — Other Ambulatory Visit: Payer: Self-pay | Admitting: Registered Nurse

## 2022-09-04 DIAGNOSIS — M7989 Other specified soft tissue disorders: Secondary | ICD-10-CM

## 2022-09-04 DIAGNOSIS — E7849 Other hyperlipidemia: Secondary | ICD-10-CM

## 2022-09-04 DIAGNOSIS — E559 Vitamin D deficiency, unspecified: Secondary | ICD-10-CM

## 2022-09-04 NOTE — Telephone Encounter (Signed)
Rx refused needs labs checked to determine what dose needed at this time.

## 2022-09-05 MED ORDER — ATORVASTATIN CALCIUM 40 MG PO TABS: 40.0000 mg | ORAL_TABLET | Freq: Every day | ORAL | 0 refills | Status: DC

## 2022-09-05 NOTE — Telephone Encounter (Signed)
Patient requested atorvastatin 40mg  po daily refill be sent to [facility] as not at [facility] until 18 Oct estimated.  Patient also asked if in his chart Dr [name] has him taking it every other day as he thought she told him to do that at his last appt with her.  Patient reported at bedtime sp02 has been dropping to 88% the last 2 days.  Using albuterol inhaler on wake up and prior to going to sleep.  Having some sinus pain/congestion/rhinitis/pressure and debris in corner of eyes.  Discussed with patient most likely allergy flare.  He is using nasal saline and flonase nasal sprays also.  Taking singulair 10mg  po qhs.  Discussed showwering prior to bedtime and he has been doing that this past month.  May take 2 puffs albuterol every 4 hours if needed e.g. sp02 below 92/shortness of breath/protracted coughing.  Continue claritin in am and singulair bedtime.  Increase frequency of nasal saline use. Has moved into different home in the past month ?allergies flare up.  Foot still red and bothering him.  Has rheumatology appt tomorrow.  Labs scheduled for when he is back onsite [facility] Vitamin D, CMET/lipid panel.  Avoid alcohol 72 hours prior to lipid panel.  Discussed with patient may need xray of foot and to talk with his rheumatology provider tomorrow at 3:20 appt.  Patient A&Ox3 spoke full sentences without difficulty no audible cough/nasal sniffing/throat clearing/wheezing/dyspnea audible duration of call 13 minutes.    Patient verbalized understanding information/instructions, agreed with plan of care and had no further questions at this time.

## 2022-09-25 ENCOUNTER — Telehealth: Payer: Self-pay | Admitting: Registered Nurse

## 2022-09-25 ENCOUNTER — Encounter: Payer: Self-pay | Admitting: Registered Nurse

## 2022-09-25 DIAGNOSIS — E6609 Other obesity due to excess calories: Secondary | ICD-10-CM

## 2022-09-25 DIAGNOSIS — J4531 Mild persistent asthma with (acute) exacerbation: Secondary | ICD-10-CM

## 2022-09-25 DIAGNOSIS — M7989 Other specified soft tissue disorders: Secondary | ICD-10-CM

## 2022-09-25 DIAGNOSIS — D62 Acute posthemorrhagic anemia: Secondary | ICD-10-CM

## 2022-09-25 DIAGNOSIS — R0600 Dyspnea, unspecified: Secondary | ICD-10-CM

## 2022-09-25 DIAGNOSIS — E559 Vitamin D deficiency, unspecified: Secondary | ICD-10-CM

## 2022-09-25 DIAGNOSIS — R051 Acute cough: Secondary | ICD-10-CM

## 2022-09-25 MED ORDER — ALBUTEROL SULFATE HFA 108 (90 BASE) MCG/ACT IN AERS: 2.0000 | INHALATION_SPRAY | RESPIRATORY_TRACT | 1 refills | Status: DC | PRN

## 2022-09-25 NOTE — Telephone Encounter (Signed)
Patient stated attempted to fill meds at [facility] and told his insurance not accepted at his pharmacy-changed 1 Oct to [facility] from [place].  Patient did give his new card to pharmacy staff.  Discussed with patient I would contact [facility] to verify what locations near him take new insurance as he needs albuterol HFA refill counter at zero and dyspnea with lying flat "I fell like fluid in lungs"  "feeling cold today"  "Had to turn up heater in my house"  Hasn't used albuterol inhaler because ran out.  Taking his allergy medications.  Has not checked sp02 recently.  Using his cpap.Marland Kitchen  He took lasix yesterday and day prior none today.  Has been checking his weight and taking as needed.  Saw orthopedics today.  Redness in foot has resolved but still painful.  No xrays or labs today.  Patient return to work delayed until Monday 10/01/22 and only cleared to work half days at this time.  Patient stated he sent work note to [place] today.  Appt scheduled for lab draws recheck vitamin D level, BMET, uric acid, CBC, magnesium Tuesday 10/24 at 1000am nonfasting.  Patient concerned about costs pharmacy with new insurance as told his wife's medications new cost $1000.  Discussed using mail order pharmacy option for their chronic medications.  May fill at [facility] his medications that we carry with no copay fees.  Patient to contact me if new concerns/worsening symptoms or other pharmacy questions.  Discussed I have requested formulary from [facility] and HR Tonya today.  Verified [facility] is a Ambulance person for patient now with [facility] customer service rep and albuterol HFA 8gm formulary 2 puffs po q4h #8 RF1 electronically sent to pharmacy of choice [facility], [place] address.  Patient A&Ox3 spoke full sentences without difficulty;  No audible cough/throat clearing/congestion/wheezing/shortness of breath during 28 minute call.  Patient sent me copy of new insurance card  for calling customer service [facility].  Patient agreed with plan of care and had no further questions at this time.

## 2022-09-27 MED ORDER — BENZONATATE 200 MG PO CAPS: 200.0000 mg | ORAL_CAPSULE | Freq: Three times a day (TID) | ORAL | 0 refills | Status: AC | PRN

## 2022-09-27 NOTE — Telephone Encounter (Signed)
Spoke with patient via telephone developed chills overnight and cough.  Fever started mid day 100.7 fatigued.  Tried honey with lemon water and OTC cough medicine spouse had at home helping some.  Was not able to pick up albuterol inhaler yesterday due to not feeling well stayed home and feeling worse today.  Sp02 92% RA checked during telephone conversation.  Has been drinking water.  Patient would like refill tessalon pearles 200mg  po TID prn cough #60 RF0 has worked well for him in the past with cough.  Discussed to continue daily weights and lasix prn per his provider instructions if weight gain.  Patient stated took furosemide today.  Denied shortness of breath or wheezing but does hear fluid in breathing when lying flat.  Spouse stated he has coughed a lot overnight nonproductive.  Denied difficulty speaking, blue lips or face, has not covid tested discussed to covid test and notify me if positive.  Spouse stated she will have to go out and purchase tests discussed showing insurance card at [employer] as last employer benefit stated 4 tests available monthly billed directly to insurance at pharmacy.  Discussed cold temperatures and most likely viral infection as circulating in community flu/adenovirus/covid/RSV at this time.  Seeing viral URI frequently in clinic.  ER precautions sp02  steady less than 90% despite albuterol/rest/cpap use, blue lips/face, syncope, trouble speaking/shortness of breath/wheezing same day evaluation.  If orthopnea worsening prop up on pillows ensure weight checked and if weight gain 5lbs ensure taking lasix/furosemide.  Spouse will go to pharmacy today to pick up albuterol.  Discussed upon awakening and prior to sleep use and prn during day 2p q4h protracted coughing/wheezing/shortness of breath/chest tightness.  Ensure not getting dehydrated if using honey for cough as could trigger gout flare.  Discussed patient may use tylenol/dextromethorphan/phenylephrine for cough OTC which he  has at home  Continue his allergy medications/nasal saline/singulair/claritin.  Consider increasing oral prednisone with flare.  Patient using his steroid inhaler from pulmonology.  Patient and spouse verbalized understanding information/instructions, agreed with plan of care and had no further questions at this time.

## 2022-09-27 NOTE — Telephone Encounter (Signed)
Spoke with patient via telephone duration of call 3 minutes.  Patient stated started albuterol inhaler use last night feeling better today fever broke.  Still having some cough but frequency decreased.  Took lasix today.  No cough audible during call.  A&Ox3 respirations even and unlabored no audible wheezing/shortness of breath/having to stop every couple of words like in past after covid infection.  OTC cough medication did help plans to pick up tessalon pearles today electronic Rx sent to his pharmacy of choice tessalon pearles 200mg  po TID prn cough #60 RF0.  Discussed will follow up with patient again tomorrow via telephone.  Continue to monitor sp02/weight daily.  Patient verbalized understanding information/instructions and had no further questions at this time.

## 2022-09-29 ENCOUNTER — Other Ambulatory Visit: Payer: Self-pay | Admitting: Registered Nurse

## 2022-09-29 NOTE — Telephone Encounter (Signed)
Patient contacted via telephone stated using tessalon pearles spouse picked up from pharmacy yesterday.  Having a lot post nasal drainage and worsens if lying flat so propping up.  Took weight this am and elevated so took dose furosemide.  Nasal discharge green this am.  Discussed that is typically viral mucous.  Patient wants to start colloidal silver for cough.  Discussed not recommended.  I know this a common substance used in central and [place] countries but not recommended as silver can accumulate in body and cause health problems.  Discussed continue albuterol inhaler 2 puffs po q6h.  Consider steam shower/humidifier as HVAC running dries out air and heated dry air irritating to bronchioles.  Cold temperatures outside can also trigger bronchospasms stay inside today.  Intermittent dry cough audible on 7 minute call today.  Spoke full sentences without difficulty.  May continue OTC cough/cold medicine along with nasal saline/flonase, tessalon pearles, honey, hydrating, albuterol inhaler in addition to his steroid inhaler and rheumatology prednisone.  Patient stated fever has resolved.  Plans to return to work Monday.  Consider covid testing prior to Monday.  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  Patient spoke full sentences without difficulty A&Ox3 no audible throat clearing/wheezing.  Patient reported yesterday sp02 RA 88% today 91-92% this am.  Denied fever/n/v/d in previous 24 hours.  Patient and spouse verbalized understanding information/instructions, agreed with plan of care and had no further questions at this time.

## 2022-10-01 ENCOUNTER — Other Ambulatory Visit: Payer: No Typology Code available for payment source

## 2022-10-01 ENCOUNTER — Telehealth: Payer: Self-pay | Admitting: Registered Nurse

## 2022-10-01 ENCOUNTER — Encounter: Payer: Self-pay | Admitting: Registered Nurse

## 2022-10-01 DIAGNOSIS — Z23 Encounter for immunization: Secondary | ICD-10-CM

## 2022-10-01 DIAGNOSIS — E559 Vitamin D deficiency, unspecified: Secondary | ICD-10-CM

## 2022-10-01 DIAGNOSIS — E6609 Other obesity due to excess calories: Secondary | ICD-10-CM

## 2022-10-01 DIAGNOSIS — J4531 Mild persistent asthma with (acute) exacerbation: Secondary | ICD-10-CM

## 2022-10-01 DIAGNOSIS — H1033 Unspecified acute conjunctivitis, bilateral: Secondary | ICD-10-CM

## 2022-10-01 DIAGNOSIS — E7849 Other hyperlipidemia: Secondary | ICD-10-CM

## 2022-10-01 DIAGNOSIS — D62 Acute posthemorrhagic anemia: Secondary | ICD-10-CM

## 2022-10-01 DIAGNOSIS — M7989 Other specified soft tissue disorders: Secondary | ICD-10-CM

## 2022-10-01 MED ORDER — PREDNISONE 10 MG PO TABS: ORAL_TABLET | ORAL | 0 refills | Status: AC

## 2022-10-01 MED ORDER — CARBOXYMETHYLCELLULOSE SOD PF 0.5 % OP SOLN: 1.0000 [drp] | Freq: Three times a day (TID) | OPHTHALMIC | 0 refills | Status: AC | PRN

## 2022-10-01 MED ORDER — KETOTIFEN FUMARATE 0.035 % OP SOLN: 1.0000 [drp] | Freq: Two times a day (BID) | OPHTHALMIC | Status: AC

## 2022-10-01 MED ORDER — ERYTHROMYCIN 5 MG/GM OP OINT: 1.0000 | TOPICAL_OINTMENT | Freq: Four times a day (QID) | OPHTHALMIC | 0 refills | Status: AC

## 2022-10-01 NOTE — Telephone Encounter (Signed)
Spoke with patient via telephone as follow up after RN Vinnie Level nurse appt this am and telephone consultation.  Duration of call 17 minutes.  Patient with frequent cough/congestion/throat clearing audible. Coughing interrupted speech at times.  Voice hoarse. Today was patient's first day back at work after covid infection and double knee replacements that started in May 2023. Patient stated in the car with spouse Ryan Glass as having to go pick up vehicle at another location.  Patient stated he forgot erythromycin ointment at work today/has not instilled any.  Using OTC eye drops helping some.  Cough worse today.  He did not have inhaler at work today very tired after work took nap in his [employer], occupational.  Hasn't rechecked his sp02 since taking albuterol inhaler once he got home.  Used advair diskus inhaler this am also along with albuterol and tessalon pearles.  Patient does not have breo ellipta or trelegy ellipta inhaler available at home any longer as not covered by [employer] insurance.  Patient wondering if he should hold orencia dose this week.  Discussed with patient I recommended yes that he hold dose due to acute infection.  Patient asked if he should see Dr Erin Fulling.  Discussed that if sp02 remaining low despite prednisone taper, advair diskus and albuterol inhaler yes I do recommend PCM/pulmonology appt for evaluation as he may need chest xray.  Based on RN Suzanne's evaluation today bronchitis most likely viral and that is why I prescribed prednisone taper.  Patient did not notify me of sinus pain earlier today only on call this evening left side pain/pressure and nasal discharge.  Consider doxycycline 100mg  po BID x 10 days #20 RF0  Ensure takes his weight in the am and if weight gain to take furosemide as fluid overload/in lungs would worsen breathing symptoms.  Discussed HVAC/dry hot air from car vents can be irritating to bronchioles also along with cold dry air am/evenings/nights and tonight predicted to get below  freezing.  Stay inside as much as possible.  Patient denied fever/chills today.  Discussed once he gets home to check sp02 again if low take 2 puffs albuterol (red) inhaler and put on cpap and recheck sp02 in 1 hour.  May use his tessalon pearles every 8 hours.  Still take his allergy medications eg. Nasal saline, Claritin and singulair If still remaining below 90 I recommend he is seen today by a provider/ER.  Advair diskus (green inhaler) is 1 puff twice a day per chart review.  Discussed with patient and spouse if blue lips/face, confusion, syncope, worsening dyspnea to go to ER tonight.  Discussed most likely viral and allergic conjunctivitis.  I prescribed erythromycin ophthalmic ointment to help with irritation/better moisturizer than plain drops and also to cover for bacterial conjunctivitis.  Patient may use refresh eye drops/ketotifen OR visine for red eyes also.  [facility]care handouts on conjunctivitis (viral, bacterial and allergic) and acute bronchitis sent to patient my chart.  Patient or spouse may contact me at [phone] if further questions tonight/in am.  Patient and spouse verbalized understanding information/instructions, agreed with plan of care and had no further questions at this time.

## 2022-10-01 NOTE — Telephone Encounter (Signed)
Late entry RN Vinnie Level contacted NP patient came to clinic for evaluation red eyes.  RN Vinnie Level concerned may be viral conjunctivitis.  Discussed with RN and patient I did think it was viral conjunctivitis due to URI symptoms starting last week but patient with history chronic allergic rhino conjunctivitis also.  Patient reported eye discharge a little more wet than usual this am and eyes matted shut required him to wash face on awakening today.  Denied visual changes/fever/chills/n/v/d.  Still having cough/sore throat/dry mouth when he wakes up.  Home covid test x 1 negative this weekend.  Has not retested.  Spoke full sentences without difficulty during 10 minute telephone call with RN Vinnie Level and myself while in clinic.  A&Ox3  RN Vinnie Level stated sp02 88% patient last used albuterol inhaler 0500 this am.  Inhaler at home.  Recommended patient take another 2 puffs as soon as he arrives home after work.  Patient to start prednisone taper 30mg  x 2 days, 20mg  x 2 days, 10mg  x 2 days #21 RF0 dispensed from [facility] to patient patient today along with erythromycin ophthalmic eye ointment apply 1 cm ou QID x 7 days #1 RF0.  Audible nasal congestion noted during phone call with patient today.  Patient last took furosemide yesterday.  Weight today 229lbs at home.  RN Vinnie Level stated wheezing bilateral breath sounds on check in clinic.  Denied rhonchi/rales.  T99.26F tympanic.  Patient to rest this afternoon.  He has appt scheduled for follow up tomorrow with me.  RN Vinnie Level will draw labs today scheduled for tomorrow since patient in clinic today and has hydrated/nonfasting labs. Discussed currently driving will send [employer] handouts on conjunctivitis to patient my chart account and medication written out instructions once at home later today.   Patient verbalized understanding information/instructions, agreed with plan of care and had no further questions at this time.

## 2022-10-01 NOTE — Progress Notes (Signed)
Labs drawn with 23g butterfly L antecubital without difficulty.  2x2 and coflex applied.  Bandaid given to patient to apply when removed.

## 2022-10-02 ENCOUNTER — Encounter: Payer: Self-pay | Admitting: Registered Nurse

## 2022-10-02 ENCOUNTER — Ambulatory Visit: Payer: No Typology Code available for payment source | Admitting: Registered Nurse

## 2022-10-02 ENCOUNTER — Other Ambulatory Visit: Payer: No Typology Code available for payment source | Admitting: Registered Nurse

## 2022-10-02 VITALS — BP 133/93 | HR 90 | Temp 98.2°F | Resp 18 | Wt 234.0 lb

## 2022-10-02 LAB — CMP12+LP+TP+TSH+6AC+CBC/D/PLT
ALT: 24 IU/L (ref 0–44)
AST: 24 IU/L (ref 0–40)
Albumin/Globulin Ratio: 2 (ref 1.2–2.2)
Albumin: 4.1 g/dL (ref 3.9–4.9)
Alkaline Phosphatase: 108 IU/L (ref 44–121)
BUN/Creatinine Ratio: 13 (ref 10–24)
BUN: 17 mg/dL (ref 8–27)
Basophils Absolute: 0 10*3/uL (ref 0.0–0.2)
Basos: 0 %
Bilirubin Total: 0.5 mg/dL (ref 0.0–1.2)
Calcium: 9.2 mg/dL (ref 8.6–10.2)
Chloride: 98 mmol/L (ref 96–106)
Chol/HDL Ratio: 3 ratio (ref 0.0–5.0)
Cholesterol, Total: 116 mg/dL (ref 100–199)
Creatinine, Ser: 1.28 mg/dL — ABNORMAL HIGH (ref 0.76–1.27)
EOS (ABSOLUTE): 0 10*3/uL (ref 0.0–0.4)
Eos: 0 %
Estimated CHD Risk: <0.5 times avg. (ref 0.0–1.0)
Free Thyroxine Index: 1.9 (ref 1.2–4.9)
GGT: 28 IU/L (ref 0–65)
Globulin, Total: 2.1 g/dL (ref 1.5–4.5)
Glucose: 91 mg/dL (ref 70–99)
HDL: 39 mg/dL — ABNORMAL LOW (ref >39)
Hematocrit: 42.1 % (ref 37.5–51.0)
Hemoglobin: 13.6 g/dL (ref 13.0–17.7)
Immature Grans (Abs): 0.1 10*3/uL (ref 0.0–0.1)
Immature Granulocytes: 1 %
Iron: 20 ug/dL — ABNORMAL LOW (ref 38–169)
LDH: 203 IU/L (ref 121–224)
LDL Chol Calc (NIH): 60 mg/dL (ref 0–99)
Lymphocytes Absolute: 1.2 10*3/uL (ref 0.7–3.1)
Lymphs: 11 %
MCH: 29.5 pg (ref 26.6–33.0)
MCHC: 32.3 g/dL (ref 31.5–35.7)
MCV: 91 fL (ref 79–97)
Monocytes Absolute: 0.8 10*3/uL (ref 0.1–0.9)
Monocytes: 8 %
Neutrophils Absolute: 8.4 10*3/uL — ABNORMAL HIGH (ref 1.4–7.0)
Neutrophils: 80 %
Phosphorus: 3.2 mg/dL (ref 2.8–4.1)
Platelets: 256 10*3/uL (ref 150–450)
Potassium: 4.7 mmol/L (ref 3.5–5.2)
RBC: 4.61 x10E6/uL (ref 4.14–5.80)
RDW: 13.6 % (ref 11.6–15.4)
Sodium: 135 mmol/L (ref 134–144)
T3 Uptake Ratio: 30 % (ref 24–39)
T4, Total: 6.4 ug/dL (ref 4.5–12.0)
TSH: 0.922 u[IU]/mL (ref 0.450–4.500)
Total Protein: 6.2 g/dL (ref 6.0–8.5)
Triglycerides: 88 mg/dL (ref 0–149)
Uric Acid: 6.7 mg/dL (ref 3.8–8.4)
VLDL Cholesterol Cal: 17 mg/dL (ref 5–40)
WBC: 10.5 10*3/uL (ref 3.4–10.8)
eGFR: 62 mL/min/{1.73_m2} (ref >59)

## 2022-10-02 LAB — HEMOGLOBIN A1C
Est. average glucose Bld gHb Est-mCnc: 111 mg/dL
Hgb A1c MFr Bld: 5.5 % (ref 4.8–5.6)

## 2022-10-02 LAB — VITAMIN D 25 HYDROXY (VIT D DEFICIENCY, FRACTURES): Vit D, 25-Hydroxy: 46.6 ng/mL (ref 30.0–100.0)

## 2022-10-02 LAB — MAGNESIUM: Magnesium: 1.9 mg/dL (ref 1.6–2.3)

## 2022-10-02 MED ORDER — VITAMIN D3 50 MCG (2000 UT) PO CAPS: 2000.0000 [IU] | ORAL_CAPSULE | Freq: Every day | ORAL | Status: DC

## 2022-10-02 MED ORDER — DOXYCYCLINE HYCLATE 100 MG PO TABS: 100.0000 mg | ORAL_TABLET | Freq: Two times a day (BID) | ORAL | 0 refills | Status: AC

## 2022-10-02 NOTE — Progress Notes (Deleted)
   Subjective:    Patient ID: Ryan Glass, male    DOB: 09-Apr-1956, 66 y.o.   MRN: [id number]  HPI    Review of Systems     Objective:   Physical Exam        Assessment & Plan:

## 2022-10-02 NOTE — Patient Instructions (Addendum)
Finish the last of your vitamin D/cholecalciferol 50,000 unit capsules once a week then start over the counter 2,000 units by mouth daily from [facility] with meal and repeat vitamin D level in 1 year  Continue prednisone 10mg  taper 20mg  x 2 days then 10mg  x 2 days Check oxygen level at home daily Wear cpap to sleep  Continue advair diskus 1 puff every 12 hours  As needed lasix/furosemide daily, tessalon pearles/benzonatate every 8 hours for cough and albuterol inhaler 2 puffs every 4 hours cough/wheezing/chest tightness/sp02 90-94  Continue erythromycin ointment both eyes at least twice a day through 08 Oct 2022  May continue over the counter eye drop from [place] x 1 week

## 2022-10-02 NOTE — Progress Notes (Unsigned)
Subjective:     Patient ID: Ryan Glass, male   DOB: Sep 28, 1956, 66 y.o.   MRN: [id number]  66y/o married [place] male established patient here for evaluation bilateral red eyes, cough, left sided sinus pain and pressure.  Started prednisone 32m po daily taper yesterday.  Using advair diskus, albuterol inhaler, tessalon pearles, nasal saline, claritin, singulair for cough/rhinitis.  Feeling much better today compared to yesterday and was able to sleep all night.  Using his cpap at night.  Denied headache/shortness of breath, eyes matted shut this am/visual changes/chest pain/fever/chills/n/v/d/body aches.  He would also like to discuss lab results from yesterday.     Review of Systems  Constitutional:  Positive for fatigue. Negative for activity change, appetite change, chills, diaphoresis and fever.  HENT:  Positive for congestion, postnasal drip, rhinorrhea, sinus pressure and sinus pain. Negative for dental problem, drooling, ear discharge, ear pain, facial swelling, hearing loss, mouth sores, nosebleeds, sneezing, trouble swallowing and voice change.   Eyes:  Positive for discharge and redness. Negative for photophobia, pain, itching and visual disturbance.  Respiratory:  Positive for cough, shortness of breath and wheezing. Negative for choking and stridor.   Cardiovascular:  Negative for chest pain.  Gastrointestinal:  Negative for diarrhea, nausea and vomiting.  Endocrine: Negative for cold intolerance and heat intolerance.  Genitourinary:  Negative for difficulty urinating.  Musculoskeletal:  Negative for gait problem, neck pain and neck stiffness.  Skin:  Negative for rash.  Allergic/Immunologic: Positive for environmental allergies. Negative for food allergies.  Neurological:  Positive for headaches. Negative for dizziness, tremors, seizures, syncope, facial asymmetry, speech difficulty, weakness, light-headedness and numbness.  Hematological:  Negative for adenopathy. Does not  bruise/bleed easily.  Psychiatric/Behavioral:  Positive for sleep disturbance. Negative for agitation and confusion.        Objective:   Physical Exam Vitals and nursing note reviewed.  Constitutional:      General: He is awake. He is not in acute distress.    Appearance: Normal appearance. He is well-developed and well-groomed. He is obese. He is not ill-appearing, toxic-appearing or diaphoretic.  HENT:     Head: Normocephalic and atraumatic.     Jaw: There is normal jaw occlusion.     Salivary Glands: Right salivary gland is not diffusely enlarged. Left salivary gland is not diffusely enlarged.     Right Ear: Hearing and external ear normal. No decreased hearing noted. No laceration, drainage, swelling or tenderness. A middle ear effusion is present. There is no impacted cerumen. No foreign body. No mastoid tenderness. No PE tube. No hemotympanum. Tympanic membrane is bulging. Tympanic membrane is not injected, scarred, perforated, erythematous or retracted.     Left Ear: Hearing and external ear normal. No decreased hearing noted. No laceration, drainage, swelling or tenderness. A middle ear effusion is present. There is no impacted cerumen. No foreign body. No mastoid tenderness. No PE tube. No hemotympanum. Tympanic membrane is bulging. Tympanic membrane is not injected, scarred, perforated, erythematous or retracted.     Ears:     Comments: Air fluid level clear bilateral TMs; cobblestoning posterior pharynx; clear discharge bilateral nasal turbinates edema/erythema; bilateral allergic shiners; lower eyelid nonpitting edema 1-2+/4 bilaterally    Nose: Mucosal edema present. No nasal deformity, signs of injury, laceration, congestion or rhinorrhea.     Right Turbinates: Enlarged and swollen. Not pale.     Left Turbinates: Enlarged and swollen. Not pale.     Right Sinus: No maxillary sinus tenderness or frontal sinus tenderness.  Left Sinus: No maxillary sinus tenderness or frontal  sinus tenderness.     Mouth/Throat:     Lips: Pink. No lesions.     Mouth: Mucous membranes are moist. No oral lesions or angioedema.     Dentition: Abnormal dentition. Has dentures. No gum lesions.     Tongue: No lesions. Tongue does not deviate from midline.     Palate: No mass and lesions.     Pharynx: Uvula midline. Pharyngeal swelling and posterior oropharyngeal erythema present. No oropharyngeal exudate or uvula swelling.     Tonsils: No tonsillar exudate. 0 on the right. 0 on the left.  Eyes:     General: Lids are normal. Vision grossly intact. Gaze aligned appropriately. Allergic shiner present. No scleral icterus.       Right eye: No discharge or hordeolum.        Left eye: No discharge or hordeolum.     Extraocular Movements: Extraocular movements intact.     Right eye: Normal extraocular motion and no nystagmus.     Left eye: Normal extraocular motion and no nystagmus.     Conjunctiva/sclera:     Right eye: Right conjunctiva is injected. No chemosis, exudate or hemorrhage.    Left eye: Left conjunctiva is injected. No chemosis, exudate or hemorrhage.    Pupils: Pupils are equal, round, and reactive to light.     Comments: Injection 1+/4 bilaterally bulbar and eyelid conjunctiva; no debris in eyelashes/eyebrows  Neck:     Thyroid: No thyroid tenderness.     Trachea: Trachea and phonation normal. No tracheal deviation.  Cardiovascular:     Rate and Rhythm: Normal rate and regular rhythm.     Pulses: Normal pulses.          Radial pulses are 2+ on the right side and 2+ on the left side.     Heart sounds: Normal heart sounds.     Comments: Trace nonpitting edema sock line bilaterally Pulmonary:     Effort: Pulmonary effort is normal. No respiratory distress.     Breath sounds: Normal breath sounds and air entry. No stridor, decreased air movement or transmitted upper airway sounds. No decreased breath sounds, wheezing, rhonchi or rales.     Comments: Spoke full sentences  without difficulty; no cough observed in exam room; sp02 above 90% during intermittent monitoring at rest and with speech Abdominal:     Palpations: Abdomen is soft.  Musculoskeletal:     Right hand: Normal strength. Normal capillary refill.     Left hand: Normal strength. Normal capillary refill.     Cervical back: Neck supple. No swelling, edema, deformity, erythema, signs of trauma, lacerations, rigidity, tenderness or crepitus. No pain with movement. Decreased range of motion.     Thoracic back: No swelling, edema, deformity, signs of trauma, lacerations or tenderness.     Lumbar back: No swelling, edema, deformity, signs of trauma, lacerations or tenderness.     Right lower leg: Edema present.     Left lower leg: Edema present.  Lymphadenopathy:     Head:     Right side of head: No submandibular or preauricular adenopathy.     Left side of head: No submandibular or preauricular adenopathy.     Cervical: No cervical adenopathy.     Right cervical: No superficial or posterior cervical adenopathy.    Left cervical: No superficial or posterior cervical adenopathy.  Skin:    General: Skin is warm and dry.     Capillary Refill:  Capillary refill takes less than 2 seconds.     Coloration: Skin is not ashen, cyanotic, jaundiced, mottled, pale or sallow.     Findings: No abrasion, abscess, acne, bruising, burn, ecchymosis, erythema, signs of injury, laceration, lesion, petechiae, rash or wound.     Nails: There is no clubbing.  Neurological:     General: No focal deficit present.     Mental Status: He is alert and oriented to person, place, and time. Mental status is at baseline.     GCS: GCS eye subscore is 4. GCS verbal subscore is 5. GCS motor subscore is 6.     Cranial Nerves: Cranial nerves 2-12 are intact. No cranial nerve deficit, dysarthria or facial asymmetry.     Sensory: Sensation is intact. No sensory deficit.     Motor: Motor function is intact. No weakness, tremor, atrophy,  abnormal muscle tone or seizure activity.     Coordination: Coordination is intact. Coordination normal.     Gait: Gait is intact. Gait normal.     Comments: In/out of chair without difficulty; gait sure and steady in clinic; bilateral hand grasp equal 5/5  Psychiatric:        Attention and Perception: Attention and perception normal.        Mood and Affect: Mood and affect normal.        Speech: Speech normal.        Behavior: Behavior normal. Behavior is cooperative.        Thought Content: Thought content normal.        Cognition and Memory: Cognition and memory normal.        Judgment: Judgment normal.     Latest Reference Range & Units 06/28/22 09:22 10/01/22 68:34  BASIC METABOLIC PANEL  Rpt !   Sodium 134 - 144 mmol/L 136 135  Potassium 3.5 - 5.2 mmol/L 3.5 4.7  Chloride 96 - 106 mmol/L 108 98  CO2 22 - 32 mmol/L 19 (L)   Glucose 70 - 99 mg/dL 101 (H) 91  BUN 8 - 27 mg/dL 34 (H) 17  Creatinine 0.76 - 1.27 mg/dL 1.21 1.28 (H)  Calcium 8.6 - 10.2 mg/dL 8.8 (L) 9.2  Anion gap 5 - 15  9   BUN/Creatinine Ratio 10 - 24   13  eGFR >59 mL/min/1.73  62  Phosphorus 2.8 - 4.1 mg/dL  3.2  Magnesium 1.6 - 2.3 mg/dL  1.9  Alkaline Phosphatase 44 - 121 IU/L  108  Albumin 3.9 - 4.9 g/dL  4.1  Albumin/Globulin Ratio 1.2 - 2.2   2.0  Uric Acid 3.8 - 8.4 mg/dL  6.7  AST 0 - 40 IU/L  24  ALT 0 - 44 IU/L  24  Total Protein 6.0 - 8.5 g/dL  6.2  Total Bilirubin 0.0 - 1.2 mg/dL  0.5  GGT 0 - 65 IU/L  28  GFR, Estimated >60 mL/min >60   Estimated CHD Risk 0.0 - 1.0 times avg.   < 0.5  LDH 121 - 224 IU/L  203  Total CHOL/HDL Ratio 0.0 - 5.0 ratio  3.0  Cholesterol, Total 100 - 199 mg/dL  116  HDL Cholesterol >39 mg/dL  39 (L)  Triglycerides 0 - 149 mg/dL  88  VLDL Cholesterol Cal 5 - 40 mg/dL  17  LDL Chol Calc (NIH) 0 - 99 mg/dL  60  Iron 38 - 169 ug/dL  20 (L)  Vitamin D, 25-Hydroxy 30.0 - 100.0 ng/mL  46.6  Globulin, Total 1.5 -  4.5 g/dL  2.1  WBC 3.4 - 10.8 x10E3/uL 10.6 (H) 10.5   RBC 4.14 - 5.80 x10E6/uL 3.58 (L) 4.61  Hemoglobin 13.0 - 17.7 g/dL 10.9 (L) 13.6  HCT 37.5 - 51.0 % 33.3 (L) 42.1  MCV 79 - 97 fL 93.0 91  MCH 26.6 - 33.0 pg 30.4 29.5  MCHC 31.5 - 35.7 g/dL 32.7 32.3  RDW 11.6 - 15.4 % 13.9 13.6  Platelets 150 - 450 x10E3/uL 306 256  nRBC 0.0 - 0.2 % 0.0   Neutrophils Not Estab. % 78 80  Lymphocytes % 14   Monocytes Relative % 7   Eosinophil % 0   Basophil % 0   Immature Granulocytes Not Estab. % 1 1  NEUT# 1.4 - 7.0 x10E3/uL 8.3 (H) 8.4 (H)  Lymphocyte # 0.7 - 3.1 x10E3/uL 1.5 1.2  Monocyte # 0.1 - 1.0 K/uL 0.7   Monocytes Absolute 0.1 - 0.9 x10E3/uL  0.8  Eosinophils Absolute 0.0 - 0.5 K/uL 0.0   Basophils Absolute 0.0 - 0.2 x10E3/uL 0.0 0.0  Abs Immature Granulocytes 0.00 - 0.07 K/uL 0.09 (H)   Immature Grans (Abs) 0.0 - 0.1 x10E3/uL  0.1  Lymphs Not Estab. %  11  Monocytes Not Estab. %  8  Basos Not Estab. %  0  Eos Not Estab. %  0  EOS (ABSOLUTE) 0.0 - 0.4 x10E3/uL  0.0  Hemoglobin A1C 4.8 - 5.6 %  5.5  Est. average glucose Bld gHb Est-mCnc mg/dL  111  TSH 0.450 - 4.500 uIU/mL  0.922  Thyroxine (T4) 4.5 - 12.0 ug/dL  6.4  Free Thyroxine Index 1.2 - 4.9   1.9  T3 Uptake Ratio 24 - 39 %  30  !: Data is abnormal (L): Data is abnormally low (H): Data is abnormally high Rpt: View report in Results Review for more information  Results discussed in detail with patient at office visit and copy of results printed and given to patient.  Creatinine elevated recent illness avoid dehydration/drink water to keep urine pale yellow clear and voiding every 2-4 hours when awake.  Continue daily weight checks to see if prn lasix/furosemide needed.  Iron still low continue supplement but anemia has resolved.  Still need to build up iron stores in body after knee replacements.  HDL good cholesterol slightly low continue your exercises once feeling better/recovered from sinusitis/bronchitis.  Continue taking your atorvastatin.  Electrolytes, vitamin D,  blood sugar, liver/thyroid function, complete blood count normal except neutrophils slightly elevated chronic.  Patient to switch from cholecalciferol prescription to daily dose 2000 international units OTC may buy bottle from [facility] tested.  Take one tablet with meal daily.  Repeat level in 1 year.  I recommend repeat kidney function/creatinine in 3 to 6 months.  Patient verbalized understanding information/instructions and had no further questions at this time.    Assessment:     Acute rhinosinusitis, acute bronchitis, bilateral conjunctivitis unspecified; elevated serum creatinine and history vitamin D deficiency    Plan:     Vitamin D level now normal; Finish the last of your vitamin D/cholecalciferol 50,000 unit capsules once a week by mouth then start over the counter 2,000 units by mouth daily from [facility] with meal and repeat vitamin D level in 1 year  Continue prednisone 12m taper po daily with breakfast has finished 358mtomorrow starts 2026m 2 days then 76m52m2 days Check oxygen level at home daily if less than 90% sit down rest.  If last albuterol  inhaler use greater than 4 hours previous take 2 puffs.  If oxygen level does not rebound to greater than 90, blue lips/face/confusion or syncope patient to go to ER/call 911 if after hours.  If during day contact PCM/pulmonology regarding appt/re-evaluation. Wear cpap to sleep Continue advair diskus 1 puff every 12 hours  Patient feeling better today and sp02 improved does not drop below 90 with activity today. As needed lasix/furosemide daily, tessalon pearles/benzonatate 264m every 8 hours for cough and albuterol inhaler 2 puffs every 4 hours cough/wheezing/chest tightness/sp02 90-94  Discussed if opaque mucous worsening sinus pain increasing mucous output, foul taste in mouth start doxycycline 1022mpo BID x 5 days #20 RF0 dispensed from [facility] to patient today.  Discussed doxycycline does not help viral infections only bacterial.  May  continue flonase 1 spray each nostril BID, saline 2 sprays each nostril q2h wa prn congestion.  Denied personal or family history of ENT cancer.  Shower BID especially prior to bed. No evidence of systemic bacterial infection, non toxic and well hydrated.  I do not see where any further testing or imaging is necessary at this time.   I will suggest supportive care, rest, good hygiene and encourage the patient to take adequate fluids.  The patient is to return to clinic or EMERGENCY ROOM if symptoms worsen or change significantly. Patient verbalized agreement and understanding of treatment plan and had no further questions at this time.   P2:  Hand washing and cover cough   Continue erythromycin ointment both eyes at least twice a day through 08 Oct 2022 May continue over the counter eye drop from [facility] 1 week (naphcon)  Avoid dehydration drink water up to limit prescribed by cardiology as increased fluid losses due to rhinitis/cough/hvac running/humidity level low at this time.  Repeat serum creatinine level in 3 to 6 months (BMET)  Continue iron supplement daily by mouth.  Repeat level in 6 to 12 months.  Next CBC in 12 months.  Anemia has resolved but iron stores still low s/p double knee replacements in the past 6 months.  Hold Orencia one week due to acute illness.  Notify your rheumatology provider.  Per epocrates caution with use during acute infection patient with sinusitis/bronchitis/conjunctivitis.  Follow up in 48 hours for re-evaluation with me in clinic  ER precautions reiterated if worsening breathing symptoms.  Optometry if visual changes same day.  Patient verbalized understanding information/instructions, agreed with plan of care and had no further questions at this time.

## 2022-10-03 NOTE — Telephone Encounter (Signed)
See tcon dated 10/01/22 and office visit 10/02/22

## 2022-10-03 NOTE — Telephone Encounter (Signed)
Patient seen in clinic 10/02/22 discussed lab results/re-evaluated much improved after 2 doses 30mg  prednisone and using albuterol/advair diskus inhalers and erythromycin ophthalmic ointment and naphcon A eye drops.

## 2022-10-06 NOTE — Telephone Encounter (Unsigned)
Spoke with patient via telephone some dry cough during call audible.  Patient stated red eyes have resolved "ointment helped the most per patient."  All symptoms improved sp02 90-92 hasn't checked level today.  Sore throat today was coughing a lot last night.  Still using advair diskus inhaler.  Discussed prn before bed and with protracted coughing reminder 2 puffs albuterol q4h and tessalon pearles q8h.  May use 1 tablespoon honey or salt water gargles for throat.  Patient stated he is going to use salt water prior to bed tonight.  Not noticing any worse post nasal drip or congestion.  Spoke full sentences without difficulty A&Ox3.  No audible throat clearing, congestion, wheezing or shortness of breath.  Duration of call 4 minutes.  Patient stated he will follow up with me in clinic on Tuesday 31 Oct.  Patient verbalized understanding information/instructions, agreed with plan of care and had no further questions at this time.

## 2022-10-08 NOTE — Telephone Encounter (Signed)
See tcon dated 10/01/22 patient returned to work as expected.

## 2022-10-10 NOTE — Telephone Encounter (Signed)
Spoke with patient via telephone stated still having cough worse when he goes outside dry.  Discussed with patient cold dry air or hot air can worsen airway inflammation symptoms.  Continue his advair diskus one puff BID, albuterol 2p po q4h prn protracted cough/wheezing, tessalon pearles 200mg  po TID prn cough, honey 1 tablespoon q4h prn cough.  Stay inside today as much as possible since cold windy or cover mouth when outside.  Sp02 check at home 91-92%.  Patient asking when he should get his flu/covid vaccines.  Discussed flu vaccine recommended now.  Covid 3 months after last infection.  Per patient las infection June 30, 2022.  Covid vaccine not available at [facility] clinic but influenza is.  Patient stated he would come tomorrow to get his flu vaccine in clinic/normal closed clinic day today.  Patient also asking which medications are fomulary [facility] in clinic as costs for his and spouse medications going up under [place] even with utilizing mail order.  Patient stated he was notified he must go to [place] to request transfer of Rx to [facility] mail order also cannot call them in.  He plans to do so today.  Medication list reviewed with patient and prednisone 10mg  can be cut in half to fill his 5mg  Rx.  Potassium chloride 16meq, furosemide 40mg , and atorvastatin 40mg  available [facility] formulary. Discussed with patient recommend he waits until 2 weeks after flu vaccine to get covid new formula annual vaccine per CDC recommendations if not given same day.  Patient verbalized understanding information/instructions, agreed with plan of care and had no further questions at this time.  Patient A&Ox3 spoke full sentences without difficulty,  no audible wheezing/cough/nasal congestion/throat clearing during 8 minute call.

## 2022-10-10 NOTE — Telephone Encounter (Signed)
Patient not available at work/finished half day work day 10/09/22.

## 2022-10-11 ENCOUNTER — Encounter: Payer: Self-pay | Admitting: Registered Nurse

## 2022-10-11 ENCOUNTER — Ambulatory Visit: Payer: No Typology Code available for payment source | Admitting: Registered Nurse

## 2022-10-11 VITALS — HR 76 | Resp 18

## 2022-10-11 DIAGNOSIS — Z23 Encounter for immunization: Secondary | ICD-10-CM

## 2022-10-11 DIAGNOSIS — J300 Vasomotor rhinitis: Secondary | ICD-10-CM

## 2022-10-11 DIAGNOSIS — J4531 Mild persistent asthma with (acute) exacerbation: Secondary | ICD-10-CM

## 2022-10-11 NOTE — Progress Notes (Signed)
Subjective:    Patient ID: Ryan Glass, male    DOB: 1956-07-22, 66 y.o.   MRN: [id number]  66y/o married hispanic male established patient here for flu shot and re-evaluation cough.  Spouse cough worsening again and he is concerned his will too.  Sp02 93-94% yesterday.  Cough improved except when he goes outside in cold air.  Slept well last night.  Denied new or worsening symptoms.  Hasn't had to use albuterol inhaler only using advair diskus bid 1 puff.  Asking if he needs to covid retest.  Test last week at home negative.      Review of Systems  Constitutional:  Negative for activity change, appetite change, chills, diaphoresis and fever.  HENT:  Positive for postnasal drip. Negative for congestion, ear discharge, ear pain, facial swelling, hearing loss, mouth sores, nosebleeds, sinus pressure, sinus pain, sore throat, trouble swallowing and voice change.   Eyes:  Negative for photophobia and visual disturbance.  Respiratory:  Positive for cough. Negative for shortness of breath, wheezing and stridor.   Cardiovascular:  Negative for chest pain.  Gastrointestinal:  Negative for diarrhea, nausea and vomiting.  Endocrine: Positive for cold intolerance.  Genitourinary:  Negative for difficulty urinating.  Musculoskeletal:  Negative for gait problem, neck pain and neck stiffness.  Allergic/Immunologic: Positive for environmental allergies. Negative for food allergies.  Neurological:  Negative for dizziness, tremors, seizures, syncope, facial asymmetry, speech difficulty, weakness, light-headedness, numbness and headaches.  Hematological:  Negative for adenopathy. Does not bruise/bleed easily.  Psychiatric/Behavioral:  Negative for agitation, confusion and self-injury.        Objective:   Physical Exam Vitals and nursing note reviewed.  Constitutional:      General: He is awake. He is not in acute distress.    Appearance: Normal appearance. He is well-developed. He is obese. He is  not ill-appearing, toxic-appearing or diaphoretic.  HENT:     Head: Normocephalic and atraumatic.     Jaw: There is normal jaw occlusion.     Salivary Glands: Right salivary gland is not diffusely enlarged or tender. Left salivary gland is not diffusely enlarged or tender.     Right Ear: Hearing, ear canal and external ear normal. A middle ear effusion is present. There is no impacted cerumen.     Left Ear: Hearing, ear canal and external ear normal. A middle ear effusion is present. There is no impacted cerumen.     Nose: Mucosal edema and rhinorrhea present. No nasal tenderness or congestion. Rhinorrhea is clear.     Right Turbinates: Enlarged and swollen. Not pale.     Left Turbinates: Enlarged and swollen. Not pale.     Right Sinus: No maxillary sinus tenderness or frontal sinus tenderness.     Left Sinus: No maxillary sinus tenderness or frontal sinus tenderness.     Mouth/Throat:     Lips: Pink. No lesions.     Mouth: Mucous membranes are moist. No lacerations, oral lesions or angioedema.     Dentition: Abnormal dentition. No gum lesions.     Tongue: No lesions. Tongue does not deviate from midline.     Palate: No mass and lesions.     Pharynx: Uvula midline. Pharyngeal swelling and posterior oropharyngeal erythema present. No oropharyngeal exudate or uvula swelling.     Tonsils: No tonsillar exudate. 0 on the right. 0 on the left.     Comments: Cobblestoning posterior pharynx; bilateral TMs air fluid level clear; bilateral allergic shiners; clear discharge bilateral nasal  turbinates edema erythema Eyes:     General: Lids are normal. Vision grossly intact. Gaze aligned appropriately. Allergic shiner present. No scleral icterus.       Right eye: No discharge.        Left eye: No discharge.     Extraocular Movements: Extraocular movements intact.     Right eye: Normal extraocular motion and no nystagmus.     Left eye: Normal extraocular motion and no nystagmus.     Conjunctiva/sclera:  Conjunctivae normal.     Right eye: Right conjunctiva is not injected. No chemosis, exudate or hemorrhage.    Left eye: Left conjunctiva is not injected. No chemosis, exudate or hemorrhage.    Pupils: Pupils are equal, round, and reactive to light.  Neck:     Trachea: Trachea and phonation normal. No tracheal deviation.  Cardiovascular:     Rate and Rhythm: Normal rate and regular rhythm.     Pulses: Normal pulses.          Radial pulses are 2+ on the right side and 2+ on the left side.     Heart sounds: Normal heart sounds, S1 normal and S2 normal.     Comments: Bilateral 1+/4 nonpitting edema lower extremities nontender no erythema Pulmonary:     Effort: Pulmonary effort is normal. No respiratory distress.     Breath sounds: Decreased air movement present. No stridor or transmitted upper airway sounds. Examination of the right-lower field reveals decreased breath sounds. Examination of the left-lower field reveals decreased breath sounds. Decreased breath sounds present. No wheezing, rhonchi or rales.     Comments: Basilar breath sounds bilaterally decreased discussed with patient to use albuterol inhaler when he gets home after work; no cough observed in clinic; spoke full sentences without difficulty  Abdominal:     Palpations: Abdomen is soft.  Musculoskeletal:        General: Normal range of motion.     Right hand: Normal strength. Normal capillary refill.     Left hand: Normal strength. Normal capillary refill.     Cervical back: Normal range of motion and neck supple. No swelling, edema, deformity, erythema, signs of trauma, lacerations, rigidity, torticollis, tenderness or crepitus.     Thoracic back: No swelling, edema, deformity, signs of trauma, lacerations or tenderness.     Right lower leg: No lacerations or tenderness. 1+ Edema present.     Left lower leg: No lacerations or tenderness. 1+ Edema present.  Lymphadenopathy:     Head:     Right side of head: No submental,  tonsillar, preauricular or posterior auricular adenopathy.     Left side of head: No submental, tonsillar, preauricular or posterior auricular adenopathy.     Cervical: No cervical adenopathy.     Right cervical: No superficial cervical adenopathy.    Left cervical: No superficial cervical adenopathy.  Skin:    General: Skin is warm and dry.     Capillary Refill: Capillary refill takes less than 2 seconds.     Coloration: Skin is not ashen, cyanotic, jaundiced, mottled, pale or sallow.     Findings: No abrasion, abscess, acne, bruising, burn, ecchymosis, erythema, signs of injury, laceration, lesion, petechiae, rash or wound.     Nails: There is no clubbing.  Neurological:     General: No focal deficit present.     Mental Status: He is alert and oriented to person, place, and time. Mental status is at baseline.     GCS: GCS eye subscore is  4. GCS verbal subscore is 5. GCS motor subscore is 6.     Cranial Nerves: Cranial nerves 2-12 are intact. No cranial nerve deficit, dysarthria or facial asymmetry.     Sensory: Sensation is intact.     Motor: Motor function is intact. No weakness, tremor, atrophy, abnormal muscle tone or seizure activity.     Coordination: Coordination is intact.     Gait: Gait is intact. Gait normal.     Comments: In/out of chair without difficulty; bilateral hand grasp equal 5/5; gait sure and steady in clinic  Psychiatric:        Attention and Perception: Attention and perception normal.        Mood and Affect: Mood and affect normal.        Speech: Speech normal.        Behavior: Behavior normal. Behavior is cooperative.        Thought Content: Thought content normal.        Cognition and Memory: Cognition and memory normal.        Judgment: Judgment normal.           Assessment & Plan:   A-need for flu vaccine, acute vasomotor rhinitis, mild persistant asthma with acute exacerbation  P-RN Darletta Moll administered flu vaccine in clinic today see epic  denied previous reactions to flu vaccine/completed questionnaire prior to vaccination.  VIS to my chart.  No adverse effects injection site clean dry and discharged ambulatory in no apparent distress respirations even and unlabored skin warm dry and pink A&Ox3.  Discussed most common side effects seen with other employees fatigue and injection site mildly sore/TTP.  Patient verbalized understanding information/instructions, agreed with plan of care and had no further questions at this time.  Discussed cold weather rhinitis past 48 hours common due to freezing temps and blustery wind  Continue his allergy medications singulair and claritin as dust with frequent HVAC on/off and wind outside.  Continue nasal saline 2 sprays each nostril q2h prn congestion.  Patient verbalized understanding information/instructions, agreed with plan of care and had no further questions at this time.  Discussed recovering from viral URI then cold front moved into area causing runny nose from cold temperatures.  Repeat covid test if new/unexpected symptoms e.g. fever, loss of taste/smell, n/v/d, worsening productive cough, sp02 decreasing again.  Last covid infection July.  Continue advair diskus 1 puff po BID; albuterol inhaler 2 puffs po prn spo2 dropping, dyspnea, shortness of breath, protracted coughing.  Cover mouth when going outside with scarf on freezing temp days.  Consider humidifier use at home and in CPAP.  Wear CPAP to sleep each night.  Use albuterol prior to sleeping tonight due to decreased basilar air movement in clinic today.  Consider covid vaccination annual/new formula in 2-4 weeks.  Patient verbalized understanding information/instructions agreed with plan of care and had no further questions at this time.

## 2022-10-11 NOTE — Patient Instructions (Signed)
Cough, Adult Coughing is a reflex that clears your throat and your airways (respiratory system). Coughing helps to heal and protect your lungs. It is normal to cough occasionally, but a cough that happens with other symptoms or lasts a long time may be a sign of a condition that needs treatment. An acute cough may only last 2-3 weeks, while a chronic cough may last 8 or more weeks. Coughing is commonly caused by: Infection of the respiratory systemby viruses or bacteria. Breathing in substances that irritate your lungs. Allergies. Asthma. Mucus that runs down the back of your throat (postnasal drip). Smoking. Acid backing up from the stomach into the esophagus (gastroesophageal reflux). Certain medicines. Chronic lung problems. Other medical conditions such as heart failure or a blood clot in the lung (pulmonary embolism). Follow these instructions at home: Medicines Take over-the-counter and prescription medicines only as told by your health care provider. Talk with your health care provider before you take a cough suppressant medicine. Lifestyle Avoid cigarette smoke. Do not use any products that contain nicotine or tobacco, such as cigarettes, e-cigarettes, and chewing tobacco. If you need help quitting, ask your health care provider. Drink enough fluid to keep your urine pale yellow. Avoid caffeine. Do not drink alcohol if your health care provider tells you not to drink. General instructions  Pay close attention to changes in your cough. Tell your health care provider about them. Always cover your mouth when you cough. Avoid things that make you cough, such as perfume, candles, cleaning products, or campfire or tobacco smoke. If the air is dry, use a cool mist vaporizer or humidifier in your bedroom or your home to help loosen secretions. If your cough is worse at night, try to sleep in a semi-upright position. Rest as needed. Keep all follow-up visits as told by your health care  provider. This is important. Contact a health care provider if you: Have new symptoms. Cough up pus. Have a cough that does not get better after 2-3 weeks or gets worse. Cannot control your cough with cough suppressant medicines and you are losing sleep. Have pain that gets worse or pain that is not helped with medicine. Have a fever. Have unexplained weight loss. Have night sweats. Get help right away if: You cough up blood. You have difficulty breathing. Your heartbeat is very fast. These symptoms may represent a serious problem that is an emergency. Do not wait to see if the symptoms will go away. Get medical help right away. Call your local emergency services (911 in the [place] Do not drive yourself to the hospital. Summary Coughing is a reflex that clears your throat and your airways. It is normal to cough occasionally, but a cough that happens with other symptoms or lasts a long time may be a sign of a condition that needs treatment. Take over-the-counter and prescription medicines only as told by your health care provider. Always cover your mouth when you cough. Contact a health care provider if you have new symptoms or a cough that does not get better after 2-3 weeks or gets worse. This information is not intended to replace advice given to you by your health care provider. Make sure you discuss any questions you have with your health care provider. Document Revised: 05/01/2022 Document Reviewed: 12/15/2018 Elsevier Patient Education  [place]. Influenza (Flu) Vaccine (Inactivated or Recombinant): What You Need to Know 1. Why get vaccinated? Influenza vaccine can prevent influenza (flu). Flu is a contagious disease that spreads around  the [place] every year, usually between October and May. Anyone can get the flu, but it is more dangerous for some people. Infants and young children, people 47 years and older, pregnant people, and people with certain health conditions  or a weakened immune system are at greatest risk of flu complications. Pneumonia, bronchitis, sinus infections, and ear infections are examples of flu-related complications. If you have a medical condition, such as heart disease, cancer, or diabetes, flu can make it worse. Flu can cause fever and chills, sore throat, muscle aches, fatigue, cough, headache, and runny or stuffy nose. Some people may have vomiting and diarrhea, though this is more common in children than adults. In an average year, thousands of people in the [place] die from flu, and many more are hospitalized. Flu vaccine prevents millions of illnesses and flu-related visits to the doctor each year. 2. Influenza vaccines CDC recommends everyone 6 months and older get vaccinated every flu season. Children 6 months through 39 years of age may need 2 doses during a single flu season. Everyone else needs only 1 dose each flu season. It takes about 2 weeks for protection to develop after vaccination. There are many flu viruses, and they are always changing. Each year a new flu vaccine is made to protect against the influenza viruses believed to be likely to cause disease in the upcoming flu season. Even when the vaccine doesn't exactly match these viruses, it may still provide some protection. Influenza vaccine does not cause flu. Influenza vaccine may be given at the same time as other vaccines. 3. Talk with your health care provider Tell your vaccination provider if the person getting the vaccine: Has had an allergic reaction after a previous dose of influenza vaccine, or has any severe, life-threatening allergies Has ever had Guillain-Barr Syndrome (also called "GBS") In some cases, your health care provider may decide to postpone influenza vaccination until a future visit. Influenza vaccine can be administered at any time during pregnancy. People who are or will be pregnant during influenza season should receive inactivated  influenza vaccine. People with minor illnesses, such as a cold, may be vaccinated. People who are moderately or severely ill should usually wait until they recover before getting influenza vaccine. Your health care provider can give you more information. 4. Risks of a vaccine reaction Soreness, redness, and swelling where the shot is given, fever, muscle aches, and headache can happen after influenza vaccination. There may be a very small increased risk of Guillain-Barr Syndrome (GBS) after inactivated influenza vaccine (the flu shot). Young children who get the flu shot along with pneumococcal vaccine (PCV13) and/or DTaP vaccine at the same time might be slightly more likely to have a seizure caused by fever. Tell your health care provider if a child who is getting flu vaccine has ever had a seizure. People sometimes faint after medical procedures, including vaccination. Tell your provider if you feel dizzy or have vision changes or ringing in the ears. As with any medicine, there is a very remote chance of a vaccine causing a severe allergic reaction, other serious injury, or death. 5. What if there is a serious problem? An allergic reaction could occur after the vaccinated person leaves the clinic. If you see signs of a severe allergic reaction (hives, swelling of the face and throat, difficulty breathing, a fast heartbeat, dizziness, or weakness), call 9-1-1 and get the person to the nearest hospital. For other signs that concern you, call your health care provider. Adverse  reactions should be reported to the Vaccine Adverse Event Reporting System (VAERS). Your health care provider will usually file this report, or you can do it yourself. Visit the VAERS website at www.vaers.LAgents.no or call 445 606 6458. VAERS is only for reporting reactions, and VAERS staff members do not give medical advice. 6. The National Vaccine Injury Compensation Program The [employer] Vaccine Injury Compensation Program  (VICP) is a federal program that was created to compensate people who may have been injured by certain vaccines. Claims regarding alleged injury or death due to vaccination have a time limit for filing, which may be as short as two years. Visit the VICP website at SpiritualWord.at or call (581) 192-2991 to learn about the program and about filing a claim. 7. How can I learn more? Ask your health care provider. Call your local or state health department. Visit the website of the Food and Drug Administration (FDA) for vaccine package inserts and additional information at FinderList.no. Contact the Centers for Disease Control and Prevention (CDC): Call (443) 217-7655 (1-800-CDC-INFO) or Visit CDC's website at BiotechRoom.com.cy. Source: CDC Vaccine Information Statement Inactivated Influenza Vaccine (07/15/2020) This same material is available at FootballExhibition.com.br for no charge. This information is not intended to replace advice given to you by your health care provider. Make sure you discuss any questions you have with your health care provider. Document Revised: 10/24/2021 Document Reviewed: 08/17/2021 Elsevier Patient Education  2023 [employer].

## 2022-10-13 NOTE — Telephone Encounter (Signed)
Patient seen in clinic see encounter note dated 10/11/22 had flu vaccine and lung check.

## 2022-10-16 NOTE — Telephone Encounter (Signed)
Patient seen in workcenter.  Sp02 95% RA HR 75 working without difficulty in [employer] today.  Patient spoke full sentences without difficulty.  No cough/congestion/wheezing.  Reported this weekend when outside working sp02 would drop into 70s and improve to 80s with rest.  Felt dizzy.  Took lasix Saturday and yesterday.  Has not used albuterol today.  Didn't have albuterol with him when working outside so didn't use until he got back home.  A&Ox3 gait sure and steady skin warm dry and pink.  Discussed with patient to carry albuterol inhaler with him in his pocket especially  later this week when cold front coming back through this week.  Today may be record high temperature 70s.  Consider scarf over mouth when outside if temperatures 40s and below. Encouraged patient to weight after urinating upon awakening and take lasix if he has weight gain. Patient planning to schedule covid vaccine in 2 weeks with his pharmacy.   Patient agreed with plan of care and had no further questions at this time.

## 2022-10-19 NOTE — Telephone Encounter (Signed)
Patient left message sp02 has varied between 80-90s.  Left message for patient I recommend he has albuterol HFA on his person and use 2 puffs every 4 hours if sp02 less than 90%, protracted coughing, wheezing, shortness of breath, chest tightness.  If sp02 or symptoms not responding to rest or albuterol HFA OR worsening cough/new symptoms to notify me this weekend.  Wear scarf over mouth when outside if temperatures outside less than 50.  Avoid working outside in the wind with nose/mouth uncovered consider mask or scarf.

## 2022-10-22 NOTE — Telephone Encounter (Signed)
Patient contacted via telephone stated has noticed when he sits/rests is when his sp02 drops the most.  Staying in the 90s as long as he is active.  Lowest bedtime when sitting in chair or lying in bed 80s.  Denied worsening cough/URI symptoms/chest pain.  Using his inhalers.  Stated he is going to discuss oxygen for his cpap machine with Dr Francine Graven at next visit.  Patient A&Ox3 spoke full sentences without difficulty no audible cough/congestion/throat clearing/shortness of breath or wheezing during 5 minute call.  Discussed I will follow up with him for lung check/spo2 tomorrow at work.  Patient agreed with plan of care, verbalized understanding information/instructions and had no further questions at this time.

## 2022-10-26 NOTE — Telephone Encounter (Signed)
Patient seen in workcenter sp02 92% RA no cough/congestion/throat clearing/shortness of breath observed.  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  Denied new or worsening symptoms just that now when sitting sp02 tends to drop and normal/above 90 when active/walking.  Discussed continue advair diskus, singulair, claritin and wearing cpap during naps/when sleeping.  If new URI symptoms or sp02 not improving with acitivity/inhalers to seek re-evaluation with [facility] or pulmonology.  Patient A&Ox3 spoke full sentences without difficulty skin warm dry and pink respirations even and unlabored RA BBS CTA.  Patient verbalized understanding information/instructions, agreed with plan of care and had no further questions at this time.

## 2022-11-06 ENCOUNTER — Encounter: Payer: Self-pay | Admitting: Registered Nurse

## 2022-11-06 ENCOUNTER — Ambulatory Visit: Payer: No Typology Code available for payment source | Admitting: Registered Nurse

## 2022-11-06 ENCOUNTER — Other Ambulatory Visit: Payer: Self-pay

## 2022-11-06 VITALS — BP 126/70 | HR 72 | Resp 18

## 2022-11-06 DIAGNOSIS — I10 Essential (primary) hypertension: Secondary | ICD-10-CM

## 2022-11-06 DIAGNOSIS — H6993 Unspecified Eustachian tube disorder, bilateral: Secondary | ICD-10-CM

## 2022-11-06 DIAGNOSIS — R42 Dizziness and giddiness: Secondary | ICD-10-CM

## 2022-11-06 DIAGNOSIS — G4733 Obstructive sleep apnea (adult) (pediatric): Secondary | ICD-10-CM

## 2022-11-06 DIAGNOSIS — R0609 Other forms of dyspnea: Secondary | ICD-10-CM

## 2022-11-06 MED ORDER — HYDRALAZINE HCL 100 MG PO TABS: 100.0000 mg | ORAL_TABLET | Freq: Three times a day (TID) | ORAL | 0 refills | Status: DC

## 2022-11-06 NOTE — Patient Instructions (Addendum)
Call your DME provider to discuss CPAP mask alternatives  Managing Your Hypertension Hypertension, also called high blood pressure, is when the force of the blood pressing against the walls of the arteries is too strong. Arteries are blood vessels that carry blood from your heart throughout your body. Hypertension forces the heart to work harder to pump blood and may cause the arteries to become narrow or stiff. Understanding blood pressure readings A blood pressure reading includes a higher number over a lower number: The first, or top, number is called the systolic pressure. It is a measure of the pressure in your arteries as your heart beats. The second, or bottom number, is called the diastolic pressure. It is a measure of the pressure in your arteries as the heart relaxes. For most people, a normal blood pressure is below 120/80. Your personal target blood pressure may vary depending on your medical conditions, your age, and other factors. Blood pressure is classified into four stages. Based on your blood pressure reading, your health care provider may use the following stages to determine what type of treatment you need, if any. Systolic pressure and diastolic pressure are measured in a unit called millimeters of mercury (mmHg). Normal Systolic pressure: below 123456. Diastolic pressure: below 80. Elevated Systolic pressure: [id number]. Diastolic pressure: below 80. Hypertension stage 1 Systolic pressure: 0000000. Diastolic pressure: [id number]. Hypertension stage 2 Systolic pressure: [id number] or above. Diastolic pressure: 90 or above. How can this condition affect me? Managing your hypertension is very important. Over time, hypertension can damage the arteries and decrease blood flow to parts of the body, including the brain, heart, and kidneys. Having untreated or uncontrolled hypertension can lead to: A heart attack. A stroke. A weakened blood vessel (aneurysm). Heart failure. Kidney  damage. Eye damage. Memory and concentration problems. Vascular dementia. What actions can I take to manage this condition? Hypertension can be managed by making lifestyle changes and possibly by taking medicines. Your health care provider will help you make a plan to bring your blood pressure within a normal range. You may be referred for counseling on a healthy diet and physical activity. Nutrition  Eat a diet that is high in fiber and potassium, and low in salt (sodium), added sugar, and fat. An example eating plan is called the DASH diet. DASH stands for Dietary Approaches to Stop Hypertension. To eat this way: Eat plenty of fresh fruits and vegetables. Try to fill one-half of your plate at each meal with fruits and vegetables. Eat whole grains, such as whole-wheat pasta, brown rice, or whole-grain bread. Fill about one-fourth of your plate with whole grains. Eat low-fat dairy products. Avoid fatty cuts of meat, processed or cured meats, and poultry with skin. Fill about one-fourth of your plate with lean proteins such as fish, chicken without skin, beans, eggs, and tofu. Avoid pre-made and processed foods. These tend to be higher in sodium, added sugar, and fat. Reduce your daily sodium intake. Many people with hypertension should eat less than 1,500 mg of sodium a day. Lifestyle  Work with your health care provider to maintain a healthy body weight or to lose weight. Ask what an ideal weight is for you. Get at least 30 minutes of exercise that causes your heart to beat faster (aerobic exercise) most days of the week. Activities may include walking, swimming, or biking. Include exercise to strengthen your muscles (resistance exercise), such as weight lifting, as part of your weekly exercise routine. Try to do these types of exercises  for 30 minutes at least 3 days a week. Do not use any products that contain nicotine or tobacco. These products include cigarettes, chewing tobacco, and vaping  devices, such as e-cigarettes. If you need help quitting, ask your health care provider. Control any long-term (chronic) conditions you have, such as high cholesterol or diabetes. Identify your sources of stress and find ways to manage stress. This may include meditation, deep breathing, or making time for fun activities. Alcohol use Do not drink alcohol if: Your health care provider tells you not to drink. You are pregnant, may be pregnant, or are planning to become pregnant. If you drink alcohol: Limit how much you have to: 0-1 drink a day for women. 0-2 drinks a day for men. Know how much alcohol is in your drink. In the [place] one drink equals one 12 oz bottle of beer (355 mL), one 5 oz glass of wine (148 mL), or one 1 oz glass of hard liquor (44 mL). Medicines Your health care provider may prescribe medicine if lifestyle changes are not enough to get your blood pressure under control and if: Your systolic blood pressure is 130 or higher. Your diastolic blood pressure is 80 or higher. Take medicines only as told by your health care provider. Follow the directions carefully. Blood pressure medicines must be taken as told by your health care provider. The medicine does not work as well when you skip doses. Skipping doses also puts you at risk for problems. Monitoring Before you monitor your blood pressure: Do not smoke, drink caffeinated beverages, or exercise within 30 minutes before taking a measurement. Use the bathroom and empty your bladder (urinate). Sit quietly for at least 5 minutes before taking measurements. Monitor your blood pressure at home as told by your health care provider. To do this: Sit with your back straight and supported. Place your feet flat on the floor. Do not cross your legs. Support your arm on a flat surface, such as a table. Make sure your upper arm is at heart level. Each time you measure, take two or three readings one minute apart and record the  results. You may also need to have your blood pressure checked regularly by your health care provider. General information Talk with your health care provider about your diet, exercise habits, and other lifestyle factors that may be contributing to hypertension. Review all the medicines you take with your health care provider because there may be side effects or interactions. Keep all follow-up visits. Your health care provider can help you create and adjust your plan for managing your high blood pressure. Where to find more information National Heart, Lung, and Blood Institute: https://wilson-eaton.com/ American Heart Association: www.heart.org Contact a health care provider if: You think you are having a reaction to medicines you have taken. You have repeated (recurrent) headaches. You feel dizzy. You have swelling in your ankles. You have trouble with your vision. Get help right away if: You develop a severe headache or confusion. You have unusual weakness or numbness, or you feel faint. You have severe pain in your chest or abdomen. You vomit repeatedly. You have trouble breathing. These symptoms may be an emergency. Get help right away. Call 911. Do not wait to see if the symptoms will go away. Do not drive yourself to the hospital. Summary Hypertension is when the force of blood pumping through your arteries is too strong. If this condition is not controlled, it may put you at risk for serious complications. Your personal  target blood pressure may vary depending on your medical conditions, your age, and other factors. For most people, a normal blood pressure is less than 120/80. Hypertension is managed by lifestyle changes, medicines, or both. Lifestyle changes to help manage hypertension include losing weight, eating a healthy, low-sodium diet, exercising more, stopping smoking, and limiting alcohol. This information is not intended to replace advice given to you by your health care  provider. Make sure you discuss any questions you have with your health care provider. Document Revised: 08/10/2021 Document Reviewed: 08/10/2021 Elsevier Patient Education  [place]. Chronic Obstructive Pulmonary Disease Exacerbation  Chronic obstructive pulmonary disease (COPD) is a long-term (chronic) condition that affects the lungs. COPD is a general term that can be used to describe many different lung problems that cause lung inflammation and limit airflow, including chronic bronchitis and emphysema. COPD exacerbations are episodes when breathing symptoms flare up, become much worse, and require extra treatment. COPD exacerbations are usually caused by infections. Without treatment, COPD exacerbations can be severe and even life threatening. Frequent COPD exacerbations can cause further damage to the lungs. What are the causes? This condition may be caused by: Respiratory infections, including viral and bacterial infections. Exposure to smoke. Exposure to air pollution, chemical fumes, or dust. Things that can cause an allergic reaction (allergens). Not taking your usual COPD medicines as directed. Underlying medical problems, such as congestive heart failure or infections not involving the lungs. In many cases, the cause of this condition is not known. What increases the risk? The following factors may make you more likely to develop this condition: Smoking cigarettes. Being an older adult. Having frequent prior COPD exacerbations. What are the signs or symptoms? Symptoms of this condition include: Increased coughing. Increased production of mucus from your lungs. Increased wheezing and shortness of breath. Rapid or labored breathing. Chest tightness. Less energy than usual. Sleep disruption from symptoms. Confusion Increased sleepiness. Often, these symptoms happen or get worse even with the use of medicines. How is this diagnosed? This condition is diagnosed  based on: Your medical history. A physical exam. You may also have tests, including: A chest X-ray. Blood tests. Lung (pulmonary) function tests. How is this treated? Treatment for this condition depends on the severity and cause of the symptoms. You may need to be admitted to a hospital for treatment. Some of the treatments commonly used to treat COPD exacerbations are: Antibiotic medicines. These may be used for severe exacerbations caused by a lung infection, such as pneumonia. Bronchodilators. These are inhaled medicines that expand the air passages and allow increased airflow. They may make your breathing more comfortable. Steroid medicines. These act to reduce inflammation in the airways. They may be given with an inhaler, taken by mouth, or given through an IV tube inserted into one of your veins. Supplemental oxygen therapy. Airway clearing techniques, such as noninvasive ventilation (NIV) and positive expiratory pressure (PEP). These provide respiratory support through a mask or other noninvasive device. An example of this would be using a continuous positive airway pressure (CPAP) machine to improve delivery of oxygen into your lungs. Follow these instructions at home: Medicines Take over-the-counter and prescription medicines only as told by your health care provider. It is important to use correct technique with inhaled medicines. If you were prescribed an antibiotic medicine or oral steroid, take it as told by your health care provider. Do not stop taking the medicine even if you start to feel better. Lifestyle Do not use any products that  contain nicotine or tobacco. These products include cigarettes, chewing tobacco, and vaping devices, such as e-cigarettes. If you need help quitting, ask your health care provider. Eat a healthy diet. Exercise regularly. Get enough sleep. Most adults need 7 or more hours per night. Avoid exposure to all substances that irritate the airway,  especially tobacco smoke. Regularly wash your hands with soap and water for at least 20 seconds. If soap and water are not available, use hand sanitizer. This may help prevent you from getting infections. During flu season, avoid enclosed spaces that are crowded with people. General instructions Drink enough fluid to keep your urine pale yellow, unless you have a medical condition that requires fluid restriction. Use a cool mist vaporizer. This humidifies the air and makes it easier for you to clear your chest when you cough. If you have a home nebulizer and oxygen, continue to use them as told by your health care provider. Keep all follow-up visits. This is important. How is this prevented? Stay up-to-date on pneumococcal and flu (influenza) vaccines. A flu shot is recommended every year to help prevent exacerbations. Quitting smoking is very important in preventing COPD from getting worse and in preventing exacerbations from happening as often. Follow all instructions for pulmonary rehabilitation after a recent exacerbation. This can help prevent future exacerbations. Work with your health care provider to develop and follow an action plan. This tells you what steps to take when you experience certain symptoms. Contact a health care provider if: You have a worsening of your regular COPD symptoms. Get help right away if: You have worsening shortness of breath, even when resting. You have trouble talking. You have severe chest pain. You cough up blood. You have a fever. You have weakness, vomit repeatedly, or faint. You feel confused. You are not able to sleep because of your symptoms. You have trouble doing daily activities. These symptoms may represent a serious problem that is an emergency. Do not wait to see if the symptoms will go away. Get medical help right away. Call your local emergency services (911 in the [place] Do not drive yourself to the hospital. Summary COPD exacerbations  are episodes when breathing symptoms become much worse and require extra treatment above your normal treatment. Exacerbations can be severe and even life threatening. Frequent COPD exacerbations can cause further damage to your lungs. COPD exacerbations are usually triggered by infections such as the flu, colds, and even pneumonia. Treatment for this condition depends on the severity and cause of the symptoms. You may need to be admitted to a hospital for treatment. Quitting smoking is very important to prevent COPD from getting worse and to prevent exacerbations from happening as often. This information is not intended to replace advice given to you by your health care provider. Make sure you discuss any questions you have with your health care provider. Document Revised: 10/04/2020 Document Reviewed: 10/04/2020 Elsevier Patient Education  [place]. Dizziness Dizziness is a common problem. It is a feeling of unsteadiness or light-headedness. You may feel like you are about to faint. Dizziness can lead to injury if you stumble or fall. Anyone can become dizzy, but dizziness is more common in older adults. This condition can be caused by a number of things, including medicines, dehydration, or illness. Follow these instructions at home: Eating and drinking  Drink enough fluid to keep your urine pale yellow. This helps to keep you from becoming dehydrated. Try to drink more clear fluids, such as water. Do  not drink alcohol. Limit your caffeine intake if told to do so by your health care provider. Check ingredients and nutrition facts to see if a food or beverage contains caffeine. Limit your salt (sodium) intake if told to do so by your health care provider. Check ingredients and nutrition facts to see if a food or beverage contains sodium. Activity  Avoid making quick movements. Rise slowly from chairs and steady yourself until you feel okay. In the morning, first sit up on the side of  the bed. When you feel okay, stand slowly while you hold onto something until you know that your balance is good. If you need to stand in one place for a long time, move your legs often. Tighten and relax the muscles in your legs while you are standing. Do not drive or use machinery if you feel dizzy. Avoid bending down if you feel dizzy. Place items in your home so that they are easy for you to reach without leaning over. Lifestyle Do not use any products that contain nicotine or tobacco. These products include cigarettes, chewing tobacco, and vaping devices, such as e-cigarettes. If you need help quitting, ask your health care provider. Try to reduce your stress level by using methods such as yoga or meditation. Talk with your health care provider if you need help to manage your stress. General instructions Watch your dizziness for any changes. Take over-the-counter and prescription medicines only as told by your health care provider. Talk with your health care provider if you think that your dizziness is caused by a medicine that you are taking. Tell a friend or a family member that you are feeling dizzy. If he or she notices any changes in your behavior, have this person call your health care provider. Keep all follow-up visits. This is important. Contact a health care provider if: Your dizziness does not go away or you have new symptoms. Your dizziness or light-headedness gets worse. You feel nauseous. You have reduced hearing. You have a fever. You have neck pain or a stiff neck. Your dizziness leads to an injury or a fall. Get help right away if: You vomit or have diarrhea and are unable to eat or drink anything. You have problems talking, walking, swallowing, or using your arms, hands, or legs. You feel generally weak. You have any bleeding. You are not thinking clearly or you have trouble forming sentences. It may take a friend or family member to notice this. You have chest pain,  abdominal pain, shortness of breath, or sweating. Your vision changes or you develop a severe headache. These symptoms may represent a serious problem that is an emergency. Do not wait to see if the symptoms will go away. Get medical help right away. Call your local emergency services (911 in the [place] Do not drive yourself to the hospital. Summary Dizziness is a feeling of unsteadiness or light-headedness. This condition can be caused by a number of things, including medicines, dehydration, or illness. Anyone can become dizzy, but dizziness is more common in older adults. Drink enough fluid to keep your urine pale yellow. Do not drink alcohol. Avoid making quick movements if you feel dizzy. Monitor your dizziness for any changes. This information is not intended to replace advice given to you by your health care provider. Make sure you discuss any questions you have with your health care provider. Document Revised: 10/31/2020 Document Reviewed: 10/31/2020 Elsevier Patient Education  [place]. Benign Positional Vertigo Vertigo is the feeling that  you or your surroundings are moving when they are not. Benign positional vertigo is the most common form of vertigo. This is usually a harmless condition (benign). This condition is positional. This means that symptoms are triggered by certain movements and positions. This condition can be dangerous if it occurs while you are doing something that could cause harm to yourself or others. This includes activities such as driving or operating machinery. What are the causes? The inner ear has fluid-filled canals that help your brain sense movement and balance. When the fluid moves, the brain receives messages about your body's position. With benign positional vertigo, calcium crystals in the inner ear break free and disturb the inner ear area. This causes your brain to receive confusing messages about your body's position. What increases the  risk? You are more likely to develop this condition if: You are a woman. You are 66 years of age or older. You have recently had a head injury. You have an inner ear disease. What are the signs or symptoms? Symptoms of this condition usually happen when you move your head or your eyes in different directions. Symptoms may start suddenly and usually last for less than a minute. They include: Loss of balance and falling. Feeling like you are spinning or moving. Feeling like your surroundings are spinning or moving. Nausea and vomiting. Blurred vision. Dizziness. Involuntary eye movement (nystagmus). Symptoms can be mild and cause only minor problems, or they can be severe and interfere with daily life. Episodes of benign positional vertigo may return (recur) over time. Symptoms may also improve over time. How is this diagnosed? This condition may be diagnosed based on: Your medical history. A physical exam of the head, neck, and ears. Positional tests to check for or stimulate vertigo. You may be asked to turn your head and change positions, such as going from sitting to lying down. A health care provider will watch for symptoms of vertigo. You may be referred to a health care provider who specializes in ear, nose, and throat problems (ENT or otolaryngologist) or a provider who specializes in disorders of the nervous system (neurologist). How is this treated?  This condition may be treated in a session in which your health care provider moves your head in specific positions to help the displaced crystals in your inner ear move. Treatment for this condition may take several sessions. Surgery may be needed in severe cases, but this is rare. In some cases, benign positional vertigo may resolve on its own in 2-4 weeks. Follow these instructions at home: Safety Move slowly. Avoid sudden body or head movements or certain positions, as told by your health care provider. Avoid driving or operating  machinery until your health care provider says it is safe. Avoid doing any tasks that would be dangerous to you or others if vertigo occurs. If you have trouble walking or keeping your balance, try using a cane for stability. If you feel dizzy or unstable, sit down right away. Return to your normal activities as told by your health care provider. Ask your health care provider what activities are safe for you. General instructions Take over-the-counter and prescription medicines only as told by your health care provider. Drink enough fluid to keep your urine pale yellow. Keep all follow-up visits. This is important. Contact a health care provider if: You have a fever. Your condition gets worse or you develop new symptoms. Your family or friends notice any behavioral changes. You have nausea or vomiting that gets  worse. You have numbness or a prickling and tingling sensation. Get help right away if you: Have difficulty speaking or moving. Are always dizzy or faint. Develop severe headaches. Have weakness in your legs or arms. Have changes in your hearing or vision. Develop a stiff neck. Develop sensitivity to light. These symptoms may represent a serious problem that is an emergency. Do not wait to see if the symptoms will go away. Get medical help right away. Call your local emergency services (911 in the [place] Do not drive yourself to the hospital. Summary Vertigo is the feeling that you or your surroundings are moving when they are not. Benign positional vertigo is the most common form of vertigo. This condition is caused by calcium crystals in the inner ear that become displaced. This causes a disturbance in an area of the inner ear that helps your brain sense movement and balance. Symptoms include loss of balance and falling, feeling that you or your surroundings are moving, nausea and vomiting, and blurred vision. This condition can be diagnosed based on symptoms, a physical exam, and  positional tests. Follow safety instructions as told by your health care provider and keep all follow-up visits. This is important. This information is not intended to replace advice given to you by your health care provider. Make sure you discuss any questions you have with your health care provider. Document Revised: 10/26/2020 Document Reviewed: 10/26/2020 Elsevier Patient Education  2023 [employer] Eustachian Tube Dysfunction  Eustachian tube dysfunction refers to a condition in which a blockage develops in the narrow passage that connects the middle ear to the back of the nose (eustachian tube). The eustachian tube regulates air pressure in the middle ear by letting air move between the ear and nose. It also helps to drain fluid from the middle ear space. Eustachian tube dysfunction can affect one or both ears. When the eustachian tube does not function properly, air pressure, fluid, or both can build up in the middle ear. What are the causes? This condition occurs when the eustachian tube becomes blocked or cannot open normally. Common causes of this condition include: Ear infections. Colds and other infections that affect the nose, mouth, and throat (upper respiratory tract). Allergies. Irritation from cigarette smoke. Irritation from stomach acid coming up into the esophagus (gastroesophageal reflux). The esophagus is the part of the body that moves food from the mouth to the stomach. Sudden changes in air pressure, such as from descending in an airplane or scuba diving. Abnormal growths in the nose or throat, such as: Growths that line the nose (nasal polyps). Abnormal growth of cells (tumors). Enlarged tissue at the back of the throat (adenoids). What increases the risk? You are more likely to develop this condition if: You smoke. You are overweight. You are a child who has: Certain birth defects of the mouth, such as cleft palate. Large tonsils or adenoids. What are the  signs or symptoms? Common symptoms of this condition include: A feeling of fullness in the ear. Ear pain. Clicking or popping noises in the ear. Ringing in the ear (tinnitus). Hearing loss. Loss of balance. Dizziness. Symptoms may get worse when the air pressure around you changes, such as when you travel to an area of high elevation, fly on an airplane, or go scuba diving. How is this diagnosed? This condition may be diagnosed based on: Your symptoms. A physical exam of your ears, nose, and throat. Tests, such as those that measure: The movement of your eardrum. Your  hearing (audiometry). How is this treated? Treatment depends on the cause and severity of your condition. In mild cases, you may relieve your symptoms by moving air into your ears. This is called "popping the ears." In more severe cases, or if you have symptoms of fluid in your ears, treatment may include: Medicines to relieve congestion (decongestants). Medicines that treat allergies (antihistamines). Nasal sprays or ear drops that contain medicines that reduce swelling (steroids). A procedure to drain the fluid in your eardrum. In this procedure, a small tube may be placed in the eardrum to: Drain the fluid. Restore the air in the middle ear space. A procedure to insert a balloon device through the nose to inflate the opening of the eustachian tube (balloon dilation). Follow these instructions at home: Lifestyle Do not do any of the following until your health care provider approves: Travel to high altitudes. Fly in airplanes. Work in a Pension scheme manager or room. Scuba dive. Do not use any products that contain nicotine or tobacco. These products include cigarettes, chewing tobacco, and vaping devices, such as e-cigarettes. If you need help quitting, ask your health care provider. Keep your ears dry. Wear fitted earplugs during showering and bathing. Dry your ears completely after. General instructions Take  over-the-counter and prescription medicines only as told by your health care provider. Use techniques to help pop your ears as recommended by your health care provider. These may include: Chewing gum. Yawning. Frequent, forceful swallowing. Closing your mouth, holding your nose closed, and gently blowing as if you are trying to blow air out of your nose. Keep all follow-up visits. This is important. Contact a health care provider if: Your symptoms do not go away after treatment. Your symptoms come back after treatment. You are unable to pop your ears. You have: A fever. Pain in your ear. Pain in your head or neck. Fluid draining from your ear. Your hearing suddenly changes. You become very dizzy. You lose your balance. Get help right away if: You have a sudden, severe increase in any of your symptoms. Summary Eustachian tube dysfunction refers to a condition in which a blockage develops in the eustachian tube. It can be caused by ear infections, allergies, inhaled irritants, or abnormal growths in the nose or throat. Symptoms may include ear pain or fullness, hearing loss, or ringing in the ears. Mild cases are treated with techniques to unblock the ears, such as yawning or chewing gum. More severe cases are treated with medicines or procedures. This information is not intended to replace advice given to you by your health care provider. Make sure you discuss any questions you have with your health care provider. Document Revised: 02/06/2021 Document Reviewed: 02/06/2021 Elsevier Patient Education  [employer].

## 2022-11-06 NOTE — Progress Notes (Signed)
Subjective:    Patient ID: Ryan Glass, male    DOB: 08-01-1956, 65 y.o.   MRN: [id number]  66y/o married established [place] male here for refill from [facility] prednisone 5mg  po daily; lung check as has noticed shortness of breath with exertion this week; needs refill nasal saline and to report he woke up from sleep 0330 11/05/22 CPAP mask leaking, bridge of nose hurting and headache; used his home BP machine and blood pressure 164/124 HR 89 continued to recheck blood pressure x 11 times between 0330-0650 range 172/122-145/105 HR 90; at 743 163/97 HR 90; 1pm 128/105 HR 89; 133/86 HR 90 14162; @1800  141/92 HR 90; @2200  136/101 HR 90 weight was up on am weight check took furosemide 11/05/22 and by bedtime weight down 5lbs per pt.  Patient is using advair diskus every day and albuterol inhaler prn not daily On 11/06/22 151/110 HR 90 @0624  recheck at home 140/93 HR 90 @0743 ; denied chest pain/wheezing/n/v/d/chills/fever.  Some runny nose/congestion using nasal saline/flonase/claritin and singulair daily. Has not had fall covid vaccination. Has noticed intermittent dizziness/room spinning has not tried meclizine/dramamine with this episode.   Did have flu vaccine.  Was waiting to have covid vaccine at work but clinic date still not determined/vaccine not in stock.  Has not covid tested last covid infection this summer 07/02/22.      Review of Systems  Constitutional:  Negative for activity change, appetite change, chills, diaphoresis, fatigue and fever.  HENT:  Positive for congestion, postnasal drip, rhinorrhea, sinus pressure and sinus pain. Negative for ear discharge, ear pain, facial swelling, hearing loss, mouth sores, nosebleeds, sneezing, sore throat, trouble swallowing and voice change.   Eyes:  Negative for photophobia and visual disturbance.  Respiratory:  Positive for cough and shortness of breath. Negative for chest tightness, wheezing and stridor.   Cardiovascular:  Negative for chest pain  and palpitations.  Gastrointestinal:  Negative for diarrhea, nausea and vomiting.  Genitourinary:  Negative for difficulty urinating.  Musculoskeletal:  Positive for arthralgias. Negative for gait problem, neck pain and neck stiffness.  Skin:  Negative for rash.  Allergic/Immunologic: Positive for environmental allergies.  Neurological:  Positive for dizziness and headaches. Negative for tremors, seizures, syncope, facial asymmetry, speech difficulty, weakness and numbness.  Hematological:  Negative for adenopathy.  Psychiatric/Behavioral:  Negative for agitation, confusion and sleep disturbance.        Objective:   Physical Exam Vitals and nursing note reviewed.  Constitutional:      General: He is not in acute distress.    Appearance: Normal appearance. He is well-developed and well-groomed. He is obese. He is not ill-appearing, toxic-appearing or diaphoretic.  HENT:     Head: Normocephalic and atraumatic.     Jaw: There is normal jaw occlusion.     Salivary Glands: Right salivary gland is not diffusely enlarged or tender. Left salivary gland is not diffusely enlarged or tender.     Right Ear: Hearing, ear canal and external ear normal. No decreased hearing noted. No laceration, drainage, swelling or tenderness. A middle ear effusion is present. There is no impacted cerumen. No foreign body. No mastoid tenderness. No hemotympanum. Tympanic membrane is not injected, scarred, perforated, erythematous or retracted.     Left Ear: Hearing, ear canal and external ear normal. No decreased hearing noted. No laceration, drainage, swelling or tenderness. A middle ear effusion is present. There is no impacted cerumen. No foreign body. No mastoid tenderness. No hemotympanum. Tympanic membrane is not injected, scarred, perforated,  erythematous or retracted.     Ears:     Comments: Air fluid level clear bilateral TMs intact    Nose: Congestion and rhinorrhea present. Rhinorrhea is clear.     Right  Turbinates: Enlarged. Not swollen or pale.     Left Turbinates: Enlarged. Not swollen or pale.     Right Sinus: No maxillary sinus tenderness or frontal sinus tenderness.     Left Sinus: No maxillary sinus tenderness or frontal sinus tenderness.     Mouth/Throat:     Lips: Pink. No lesions.     Mouth: Mucous membranes are moist. No oral lesions or angioedema.     Dentition: Abnormal dentition. Has dentures. No gum lesions.     Tongue: No lesions. Tongue does not deviate from midline.     Palate: No mass and lesions.     Pharynx: Uvula midline. Pharyngeal swelling and posterior oropharyngeal erythema present. No oropharyngeal exudate or uvula swelling.     Tonsils: No tonsillar exudate.     Comments: Cobblestoning posterior pharynx; bilateral allergic shiners; bilateral lower eyelids edema nonpitting 2+/4; nasal sniffing and congestion noted in clinic Eyes:     General: Lids are normal. Allergic shiner present.     Extraocular Movements: Extraocular movements intact.     Right eye: Normal extraocular motion and no nystagmus.     Left eye: Normal extraocular motion and no nystagmus.     Conjunctiva/sclera: Conjunctivae normal.     Right eye: Right conjunctiva is not injected. No chemosis, exudate or hemorrhage.    Left eye: Left conjunctiva is not injected. No chemosis, exudate or hemorrhage.    Pupils: Pupils are equal, round, and reactive to light.  Neck:     Trachea: Trachea and phonation normal. No tracheal deviation.  Cardiovascular:     Rate and Rhythm: Normal rate and regular rhythm.     Pulses: Normal pulses.          Radial pulses are 2+ on the right side and 2+ on the left side.     Heart sounds: Normal heart sounds.     Comments: Trace edema at sock line nonpitting bilaterally Pulmonary:     Effort: Pulmonary effort is normal. No tachypnea, bradypnea, accessory muscle usage or respiratory distress.     Breath sounds: Normal breath sounds and air entry. No stridor. No  decreased breath sounds, wheezing, rhonchi or rales.     Comments: Spoke full sentences without difficulty; minimal dyspnea on arrival to clinic after walking across warehouse respirations 18-20 and decreased to 18 while sitting 5 minutes for BP and rooming for appt Abdominal:     Palpations: Abdomen is soft.  Musculoskeletal:        General: No tenderness or signs of injury. Normal range of motion.     Right hand: Normal strength. Normal capillary refill.     Left hand: Normal strength. Normal capillary refill.     Cervical back: Normal range of motion and neck supple. No edema, erythema, rigidity or crepitus. No pain with movement.     Right lower leg: No tenderness. Edema present.     Left lower leg: No tenderness. Edema present.     Right ankle: Swelling present. No ecchymosis or lacerations. No tenderness.     Left ankle: Swelling present. No ecchymosis or lacerations. No tenderness.  Lymphadenopathy:     Head:     Right side of head: No submental, submandibular, tonsillar, preauricular, posterior auricular or occipital adenopathy.  Left side of head: No submental, submandibular, tonsillar, preauricular, posterior auricular or occipital adenopathy.     Cervical: No cervical adenopathy.     Right cervical: No superficial or posterior cervical adenopathy.    Left cervical: No superficial or posterior cervical adenopathy.  Skin:    General: Skin is warm and dry.     Capillary Refill: Capillary refill takes less than 2 seconds.     Coloration: Skin is not ashen, cyanotic, jaundiced, mottled, pale or sallow.     Findings: No abrasion, abscess, acne, bruising, burn, ecchymosis, erythema, signs of injury, laceration, lesion, petechiae, rash or wound.     Nails: There is no clubbing.  Neurological:     General: No focal deficit present.     Mental Status: He is alert and oriented to person, place, and time.     GCS: GCS eye subscore is 4. GCS verbal subscore is 5. GCS motor subscore is  6.     Cranial Nerves: Cranial nerves 2-12 are intact. No cranial nerve deficit, dysarthria or facial asymmetry.     Motor: Motor function is intact. No weakness, tremor, atrophy, abnormal muscle tone or seizure activity.     Coordination: Coordination is intact. Coordination normal.     Gait: Gait is intact. Gait normal.     Comments: In/out of chair without difficulty; gait sure and steady in clinic  Psychiatric:        Attention and Perception: Attention and perception normal.        Mood and Affect: Mood and affect normal. Mood is not anxious.        Speech: Speech normal.        Behavior: Behavior normal. Behavior is not agitated. Behavior is cooperative.        Thought Content: Thought content normal.        Cognition and Memory: Cognition and memory normal.        Judgment: Judgment normal.           Assessment & Plan:   A-essential hypertension, OSA, eustachian tube dysfunction, dizziness, dyspnea on exertion  P-refilled from [facility] to patient prednisone 10mg  sig t1/2 po daily #84 RF0 discussed with patient will need new Rx for next fill from rheumatology  Forgot to take his clartin 10mg  po this am given 1 ud from clinic stock.  Given 1 bottle nasal saline from clinic stock to use 2 sprays each nostril q2h prn congestion.  Discussed cold weather, virus and/or allergies could be causing his rhinitis/congestion.  All common in community at this time.  Discussed with patient per his latest sleep study if cpap mask not fitting well could be contributing to his hypertension while sleeping as recently had retitration sleep study which showed hypertension/tachycardia when not at optimal pressure. Patient to follow up with his DME provider to discuss mask alternatives as current one hurting bridge of nose and leaking.  Take his medications as prescribed every day, continue am daily weights and prn furosemide per his care team with weight gain.  Patient had saltier foods than usual over the  holiday last week.  Reminded patient to drink his 2 liters water per day to help flush excess sodium out of body.  Use advair diskus as prescribed and if shortness of breath/wheezing/chest tightness albuterol 2p po q4-6h prn.  Follow up re-evaluation if chest pain, home blood pressure readings sustained elevated and not responding to rest and his medications.  Exitcare handout on managing hypertension.  Discussed dyspnea during exertion  could be related to hypertension if BP elevated.  I recommended rest/no heavy exertion/lifting if blood pressure greater than 150 systolic/100 diastolic bottom number.  Discussed staying inside if temperatures less than 50 degrees/covering mask with scarf in am/evenings during coldest temp times.  Avoid foods with high salt content or added salt to foods this week.  Avoid caffeine if blood pressure elevated.  Consider home covid test or PCM flu/rsv testing or worsening URI symptoms.  Patient verbalized understanding information/instructions, agreed with plan of care and had no further questions at this time.   No evidence of invasive bacterial infection, non toxic and well hydrated.  I do not see where any further testing or imaging is necessary at this time.   I will suggest supportive care, rest, good hygiene and encourage the patient to take adequate fluids.  The patient is to return to clinic or EMERGENCY ROOM if symptoms worsen or change significantly e.g. ear pain, fever, purulent discharge from ears or bleeding.  Exitcare handout on eustachian tube dysfunction.  Discussed with patient post nasal drip irritates throat/causes swelling blocks eustachian tubes from draining and fluid fills up middle ear.  Bacteria/viruses can grow in fluid and with moving head tube compressed and increases pressure in tube/ear worsening pain.  Studies show will take 30 days for fluid to resolve after post nasal drip controlled with nasal steroid/antihistamine. Antibiotics and steroids do not  speed up fluid removal.  Patient verbalized agreement and understanding of treatment plan and had no further questions at this time.   Discussed with patient otic effusion/bilateral eustachian tube dysfunction probably causing vertigo but could also be elevated blood pressure. Meclizine 25mg  po prn helpful in the past. Has OTC dramamine at home uses typically for motion sickness prn.  Supportive treatment may take up to 4 doses meclizine per day max 100mg  per 24 hours. Discussed signs/symptoms stroke.  Someone to drive him home recommended not driving during vertigo episodes. Follow up if aphasia, dysphasia, visual changes, weakness, fall, worst headache of life, incoordination, fever, ear discharge. Consider ENT evaluation/follow up with PCM if worsening symptoms not controlled with meclizine.  Exitcare handout on vertigo/dizziness.   Patient verbalized understanding of information/agreed with plan of care and had no further questions at this time.

## 2022-11-06 NOTE — Telephone Encounter (Signed)
Pt's medication was sent to pt's pharmacy as requested. Confirmation received.  °

## 2022-11-07 ENCOUNTER — Telehealth: Payer: Self-pay | Admitting: Pulmonary Disease

## 2022-11-07 DIAGNOSIS — R0902 Hypoxemia: Secondary | ICD-10-CM

## 2022-11-07 DIAGNOSIS — G4733 Obstructive sleep apnea (adult) (pediatric): Secondary | ICD-10-CM

## 2022-11-07 MED ORDER — APIXABAN 5 MG PO TABS: 5.0000 mg | ORAL_TABLET | Freq: Two times a day (BID) | ORAL | 0 refills | Status: DC

## 2022-11-07 MED ORDER — MONTELUKAST SODIUM 10 MG PO TABS: 10.0000 mg | ORAL_TABLET | Freq: Every day | ORAL | 3 refills | Status: DC

## 2022-11-07 NOTE — Telephone Encounter (Signed)
Called and spoke with patient regarding refill of eliquis and singulair.  Verified pharmacy and refills sent.    Patient wears his CPAP at HS and states that his oxygen level drops to 82-84% and said the pressure was increased, however, his sats continue to drop.  I advised him I would get a message to Dr. Francine Graven and we will call him back once we hear back from him with his recommendations.  He verbalized understanding.  Download provided.  Dr. Francine Graven, please advise.  Thank you.

## 2022-11-07 NOTE — Telephone Encounter (Signed)
Spoke with Dr. Francine Graven and he requested that an ONO on the CPAP be ordered.  Advised that the patient does not use oxygen with his CPAP.  ONO ordered with CPAP.  Called and spoke with patient, advised that I spoke with Dr. Francine Graven and he is requesting an ONO on CPAP and that [employer] will contact him to arrange to get him the equipment.  He verbalized understanding.  Nothing further needed.

## 2022-11-07 NOTE — Telephone Encounter (Signed)
Patient seen in clinic 11/06/2022 see office note.

## 2022-11-13 ENCOUNTER — Telehealth: Payer: Self-pay | Admitting: Registered Nurse

## 2022-11-13 DIAGNOSIS — Z86711 Personal history of pulmonary embolism: Secondary | ICD-10-CM

## 2022-11-15 NOTE — Telephone Encounter (Signed)
Patient stated his new pharmacy telling him his copay for eliquis $200.  He used to pay $10 at [facility] pharmacy.  Manufacturer assist program application completed for patient and it states for patient to call them.  Number given to patient and he stated he would call today.  Eligible patients can save on out-of-pocket costs for ELIQUIS. To request a Co-pay Card, please complete the form below. If you have any questions or would like to speak with a live specialist, call [phone]) Monday - Friday, 8 AM - 8 PM (ET) or Saturday - Sunday 9 AM - 6 PM (ET).

## 2022-11-26 ENCOUNTER — Ambulatory Visit (INDEPENDENT_AMBULATORY_CARE_PROVIDER_SITE_OTHER): Payer: No Typology Code available for payment source

## 2022-11-26 DIAGNOSIS — I472 Ventricular tachycardia, unspecified: Secondary | ICD-10-CM | POA: Diagnosis not present

## 2022-11-27 LAB — CUP PACEART REMOTE DEVICE CHECK
Battery Remaining Longevity: 68 mo
Battery Remaining Percentage: 80 %
Battery Voltage: 2.98 V
Brady Statistic AP VP Percent: 53 %
Brady Statistic AP VS Percent: 45 %
Brady Statistic AS VP Percent: 1 %
Brady Statistic AS VS Percent: 1.4 %
Brady Statistic RA Percent Paced: 96 %
Brady Statistic RV Percent Paced: 53 %
Date Time Interrogation Session: 20231216080252
HighPow Impedance: 71 Ohm
Implantable Lead Connection Status: 753985
Implantable Lead Connection Status: 753985
Implantable Lead Implant Date: 20220829
Implantable Lead Implant Date: 20220829
Implantable Lead Location: 753859
Implantable Lead Location: 753860
Implantable Pulse Generator Implant Date: 20220829
Lead Channel Impedance Value: 410 Ohm
Lead Channel Impedance Value: 440 Ohm
Lead Channel Pacing Threshold Amplitude: 0.75 V
Lead Channel Pacing Threshold Amplitude: 0.75 V
Lead Channel Pacing Threshold Pulse Width: 0.5 ms
Lead Channel Pacing Threshold Pulse Width: 0.5 ms
Lead Channel Sensing Intrinsic Amplitude: 12 mV
Lead Channel Sensing Intrinsic Amplitude: 2.9 mV
Lead Channel Setting Pacing Amplitude: 2 V
Lead Channel Setting Pacing Amplitude: 2.5 V
Lead Channel Setting Pacing Pulse Width: 0.5 ms
Lead Channel Setting Sensing Sensitivity: 0.5 mV
Pulse Gen Serial Number: 810029878

## 2022-11-30 ENCOUNTER — Other Ambulatory Visit: Payer: Self-pay | Admitting: Registered Nurse

## 2022-11-30 ENCOUNTER — Encounter: Payer: Self-pay | Admitting: Registered Nurse

## 2022-11-30 DIAGNOSIS — E7849 Other hyperlipidemia: Secondary | ICD-10-CM

## 2022-11-30 NOTE — Telephone Encounter (Signed)
Patient contacted via telephone as received message from [facility] needs new Rx for lipitor.  Patient does not want Rx sent to [facility] for statin refill as not a network pharmacy under new insurance coverage that started 09 Sep 2022.  Rx refused electronically to [facility]. Patient sent cardiology device report to NP asked me to review as he hasn't received any further instructions from cardiology.  Epic reviewed Dr [name] stated "next remote recheck of device in 91 days continue eliquis as history of PAF and atrial arrhythmias detected all less than 1 minute and 2 NSVT showing SVT normal device function"  Patient verbalized understanding information/instructions and had no further questions.  Stated he will need refill in 2 weeks for lipitor from [facility].  Discussed with patient I am next in clinic 04 Dec 2022.  Patient asking when next CT scan to check aneurysm chest vessels due.  Epic reviewed last CT scan chest 12/13/21 and Dr [name] stated repeat in one year.  No new order for patient seen in Epic.  Patient notified to send message to Dr Theodosia Blender office.  Patient reported he stopped Orencia after knee surgery this summer.  He has follow up rheumatology appt 12/18/2022 and will discuss with Dr Gerilyn Nestle if he needs to restart.  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  Unsure if he needs to continue at this time.  Patient asking if he should schedule covid vaccine.  Discussed with patient covid, flu and RSV in community has been increasing since Thanksgiving and I do recommend he receive his next vaccination as overdue since Sep 2023 when new formula released.  He plans to calls [facility] to schedule. Last on file 2022; patient stated had at work in 2023 and then covid infection summer 2023 total 6 doses per patient Duration of call 19 minutes  Patient A&Ox3 spoke full sentences without difficulty  No audible cough/wheeze/shortness of  breath/nasal congestion.  Medication reconciliation completed with patient and  Epic reviewed/updated.  Last labs 10/01/22 see below.  Patient still taking claritin, nasal saline and singulair.  Having some sinus pain and pressure and using more saline and it has helped to loosen up mucous and stop under eye/cheek pain that had been occurring in the past week when he wears his cpap mask.  Denied fever/chills/n/v/d.  Discussed with patient trigeminal nerve runs along same area and compression by mask could be giving him symptoms that relieve when mask removed also.  Continue to monitor symptoms and follow up with his DME provider if only pain during hours of sleep and relieves with removal of CPAP mask.  Patient verbalized understanding information/instructions, agreed with plan of care and had no further questions at this time.   Latest Reference Range & Units 10/01/22 10:55  Sodium 134 - 144 mmol/L 135  Potassium 3.5 - 5.2 mmol/L 4.7  Chloride 96 - 106 mmol/L 98  Glucose 70 - 99 mg/dL 91  BUN 8 - 27 mg/dL 17  Creatinine 0.76 - 1.27 mg/dL 1.28 (H)  Calcium 8.6 - 10.2 mg/dL 9.2  BUN/Creatinine Ratio 10 - 24  13  eGFR >59 mL/min/1.73 62  Phosphorus 2.8 - 4.1 mg/dL 3.2  Magnesium 1.6 - 2.3 mg/dL 1.9  Alkaline Phosphatase 44 - 121 IU/L 108  Albumin 3.9 - 4.9 g/dL 4.1  Albumin/Globulin Ratio 1.2 - 2.2  2.0  Uric Acid 3.8 - 8.4 mg/dL 6.7  AST 0 - 40 IU/L 24  ALT  0 - 44 IU/L 24  Total Protein 6.0 - 8.5 g/dL 6.2  Total Bilirubin 0.0 - 1.2 mg/dL 0.5  GGT 0 - 65 IU/L 28  Estimated CHD Risk 0.0 - 1.0 times avg.  < 0.5  LDH 121 - 224 IU/L 203  Total CHOL/HDL Ratio 0.0 - 5.0 ratio 3.0  Cholesterol, Total 100 - 199 mg/dL 116  HDL Cholesterol >39 mg/dL 39 (L)  Triglycerides 0 - 149 mg/dL 88  VLDL Cholesterol Cal 5 - 40 mg/dL 17  LDL Chol Calc (NIH) 0 - 99 mg/dL 60  Iron 38 - 169 ug/dL 20 (L)  Vitamin D, 25-Hydroxy 30.0 - 100.0 ng/mL 46.6  Globulin, Total 1.5 - 4.5 g/dL 2.1  WBC 3.4 - 10.8  x10E3/uL 10.5  RBC 4.14 - 5.80 x10E6/uL 4.61  Hemoglobin 13.0 - 17.7 g/dL 13.6  HCT 37.5 - 51.0 % 42.1  MCV 79 - 97 fL 91  MCH 26.6 - 33.0 pg 29.5  MCHC 31.5 - 35.7 g/dL 32.3  RDW 11.6 - 15.4 % 13.6  Platelets 150 - 450 x10E3/uL 256  Neutrophils Not Estab. % 80  Immature Granulocytes Not Estab. % 1  NEUT# 1.4 - 7.0 x10E3/uL 8.4 (H)  Lymphocyte # 0.7 - 3.1 x10E3/uL 1.2  Monocytes Absolute 0.1 - 0.9 x10E3/uL 0.8  Basophils Absolute 0.0 - 0.2 x10E3/uL 0.0  Immature Grans (Abs) 0.0 - 0.1 x10E3/uL 0.1  Lymphs Not Estab. % 11  Monocytes Not Estab. % 8  Basos Not Estab. % 0  Eos Not Estab. % 0  EOS (ABSOLUTE) 0.0 - 0.4 x10E3/uL 0.0  Hemoglobin A1C 4.8 - 5.6 % 5.5  Est. average glucose Bld gHb Est-mCnc mg/dL 111  TSH 0.450 - 4.500 uIU/mL 0.922  Thyroxine (T4) 4.5 - 12.0 ug/dL 6.4  Free Thyroxine Index 1.2 - 4.9  1.9  T3 Uptake Ratio 24 - 39 % 30  (H): Data is abnormally high (L): Data is abnormally low

## 2022-12-01 NOTE — Telephone Encounter (Signed)
Patient reported spoke with eliquis manufacturer and coupon code given to new pharmacy and had $10 copay no further needs at this time.

## 2022-12-03 ENCOUNTER — Other Ambulatory Visit: Payer: Self-pay | Admitting: Cardiology

## 2022-12-04 MED ORDER — ATORVASTATIN CALCIUM 40 MG PO TABS: 40.0000 mg | ORAL_TABLET | Freq: Every day | ORAL | 0 refills | Status: DC

## 2022-12-04 NOTE — Telephone Encounter (Signed)
[facility] refilled atorvastatin 40mg  po daily #90 RF0 from [facility] today.  Last filled Oct 2023 per paper chart review.

## 2022-12-20 ENCOUNTER — Other Ambulatory Visit: Payer: Self-pay | Admitting: *Deleted

## 2022-12-20 ENCOUNTER — Other Ambulatory Visit: Payer: Self-pay | Admitting: Pulmonary Disease

## 2022-12-20 MED ORDER — APIXABAN 5 MG PO TABS: 5.0000 mg | ORAL_TABLET | Freq: Two times a day (BID) | ORAL | 1 refills | Status: DC

## 2022-12-20 NOTE — Telephone Encounter (Signed)
Patient called the office and asked that Eliquis be sent in for 90 day to [place], [address] [place], [place]. Patient would like to be phoned at [phone] when medication has been sent in. Thank you.

## 2022-12-20 NOTE — Telephone Encounter (Signed)
Prescription refill request for Eliquis received. Indication: AF Last office visit: 04/05/22  D Bensimhon MD Scr: 1.38 on 12/18/22 Age: 67 Weight: 111.9kg  Based on above findings Eliquis 5mg  twice daily is the appropriate dose.  Refills approved.

## 2022-12-26 ENCOUNTER — Telehealth: Payer: Self-pay | Admitting: Registered Nurse

## 2022-12-26 ENCOUNTER — Encounter: Payer: Self-pay | Admitting: Registered Nurse

## 2022-12-26 DIAGNOSIS — J3089 Other allergic rhinitis: Secondary | ICD-10-CM

## 2022-12-26 NOTE — Telephone Encounter (Unsigned)
Attempted to follow up with patient in workcenter as cold front in area and history of dyspnea with very hot or cold temperatures.  Not available.  Patient returned call stated he has noticed some dyspnea with time spent outside this week but using advair inhaler and trying to stay inside during coldest portions of the day and managing symptoms.  Denied need for albuterol use at this time.  Denied questions or concerns at this time.  Will follow up for evaluation if new or worsening symptoms.  Patient A&Ox3 spoke full sentences without difficulty.  No audible wheezing/congestion or shortness of breath during 2 minute telephone conversation.  Patient verbalized understanding information/instructions, agreed with plan of care and had no further questions at this time.

## 2022-12-28 ENCOUNTER — Other Ambulatory Visit (HOSPITAL_BASED_OUTPATIENT_CLINIC_OR_DEPARTMENT_OTHER): Payer: Self-pay

## 2022-12-28 MED ORDER — COVID-19 MRNA 2023-2024 VACCINE (COMIRNATY) 0.3 ML INJECTION
0.3000 mL | Freq: Once | INTRAMUSCULAR | 0 refills | Status: AC
  Filled 2022-12-28: qty 0.3, 1d supply, fill #0

## 2022-12-31 NOTE — Progress Notes (Signed)
Remote ICD transmission.   

## 2023-01-01 ENCOUNTER — Telehealth: Payer: Self-pay | Admitting: Cardiology

## 2023-01-01 NOTE — Telephone Encounter (Signed)
Patient is called to talk with Dr. [name] or nurse. Please call back

## 2023-01-01 NOTE — Telephone Encounter (Signed)
Spoke with the patient who is wondering if he needs to follow up with Dr. [name] and have any imaging done to follow up on his aorta. I have scheduled patient for an office visit and advised that we will order any needed testing at that time. Patient verbalized understanding.

## 2023-01-02 NOTE — Telephone Encounter (Signed)
Patient seen in workcenter denied concerns.  A&Ox3 spoke full sentences without difficulty; no observed cough/throat clearing/nasal congestion/wheezing or shortness of breath.  Skin warm dry and pink.  Plans to see RN Kimrey later today for refill of nasal saline from clinic stock.

## 2023-01-03 ENCOUNTER — Telehealth: Payer: Self-pay | Admitting: Registered Nurse

## 2023-01-03 ENCOUNTER — Ambulatory Visit: Payer: No Typology Code available for payment source | Admitting: Occupational Medicine

## 2023-01-03 VITALS — BP 124/88 | HR 90 | Temp 97.2°F

## 2023-01-03 DIAGNOSIS — R0981 Nasal congestion: Secondary | ICD-10-CM

## 2023-01-03 MED ORDER — MAGNESIUM OXIDE 400 MG PO TABS
400.0000 mg | ORAL_TABLET | ORAL | 2 refills | Status: DC
  Filled 2023-07-30: qty 90, 180d supply, fill #0

## 2023-01-03 NOTE — Progress Notes (Signed)
Stuffy nose given saline spray and Graesyn Schreifels NP refilled magnesium.

## 2023-01-08 ENCOUNTER — Encounter: Payer: Self-pay | Admitting: Pulmonary Disease

## 2023-01-08 ENCOUNTER — Ambulatory Visit: Payer: No Typology Code available for payment source | Admitting: Pulmonary Disease

## 2023-01-08 VITALS — BP 118/76 | HR 85 | Ht 68.0 in | Wt 238.8 lb

## 2023-01-08 DIAGNOSIS — G4733 Obstructive sleep apnea (adult) (pediatric): Secondary | ICD-10-CM

## 2023-01-08 DIAGNOSIS — J189 Pneumonia, unspecified organism: Secondary | ICD-10-CM

## 2023-01-08 MED ORDER — AMOXICILLIN-POT CLAVULANATE 875-125 MG PO TABS: 1.0000 | ORAL_TABLET | Freq: Two times a day (BID) | ORAL | 0 refills | Status: DC

## 2023-01-08 MED ORDER — PREDNISONE 10 MG PO TABS: ORAL_TABLET | ORAL | 0 refills | Status: AC

## 2023-01-08 NOTE — Progress Notes (Unsigned)
Synopsis: Referred by Bernadette Hoit, MD for post-covid 19 dyspnea  Subjective:   PATIENT ID: Ryan Glass GENDER: male DOB: 1956-06-21, MRN: [id number]  HPI  Chief Complaint  Patient presents with   Follow-up    6 mo f/u. States he has been more congested over the past week. Has a productive cough with green phlegm.    Ryan Glass is a 67 year old male, never smoker with hypertension, rheumatoid arthritis and obstructive sleep apnea on CPAP who returns to pulmonary clinic shortness of breath.   He has wheezing, cough, mucous production and shortness of breath.     OV He recently had right knee arthorplasty complicated by sepsis presentation requiring admission 7/12. He has been feeling ok since discharge. He continues to have significant pain of his right knee. He had appointment with his surgical team today.   He is using albuterol twice daily - unkown response. He continue to take montelukast. No current issues with his breathing.   OV 10/31/21 He continues to have exertional shortness of breath. He has not started cardiopulmonary rehab yet. He is having a hard time being able to complete his work as he returned recently. He complains of being tired and fatiguing easily.   He remains on advair 100-33mcg 1 puff twice daily as he reports benefit from the inhaler.   He remains off supplemental oxygen at this time.   He has not had CPAP titration study.  OV 09/28/21 Patient is feeling better since last visit and he has been transition to sotalol therapy and remains off carvedilol and diltiazem due to bradycardia.  He has been followed closely by his cardiology team and seems to be in a better spot at this time regarding his cardiac function.  He continues to have shortness of breath with exertion though.  He has been taken off supplemental oxygen therapy based on his recent clinic checks with no desaturations upon ambulation.  OV 09/05/21 He was admitted in June 2022 for  progressive dyspnea and chest pain where he was found to have a segmental pulmonary emboli after traveling to [place] and having dental implant surgery done. He was treated with heparin and then transitioned to eliquis. He was discharged home on supplemental oxygen as he had desaturations in his SpO2 at that time.   Echo 05/26/21 showed LVEF 45-50% (down from 60-65% 02/08/21) with severe asymetric left ventricular hypertrophy. Grade I diastolic dysfunction. RV systolic function is moderately reduced and RV size is moderately enlarged with RVSP 50.35mmHg. His echo on 02/08/21 previously showed normal RV function and mildly enlarged size. There is dilation in the RA and LA on both the 6/17 and 3/2 echos.   LHC 05/29/2021 showed minimal CAD and RHC showed normal pressures. RV 26/1, PA 25/4, PA mean 14, PW mean 12, LVEDP 8.  HRCT Chest on 06/07/21 shows band like scarring or partial atelectasis of the dependent lungs which is unchanged from prior studies. No air trapping noted. No subpleural reticulation noted.   PFTs 11/2020 normal ratio, no airway obstruction, no restriction, DLCO 71%. Repeat PFTs 08/01/21 show normal ratio, no obstruction or restriction and DLCO 89%.   He again had oxygen desaturations and required 2L of supplemental oxygen with ambulation at last visit on 07/20/21.   He was admitted 08/02/21 for concerns of ventricular trachycardia noted on his Zio extended cardiac monitor. TTE bubble study on 08/03/21 showed positive shunting within 3-6 cardiac cycles suggestive of interatrial shunt. The echo was also concerning for hypertrophic  cardiomyopathy. He under went ICD placement on 08/07/21. He was discharged home with carvedilol and verapamil for rate control medications.  Discussions were held between his cardiology team and myself regarding his dyspnea out of proportion to his testing results in regards to his HRCT chest scan and recent PFTs. We decided to move forward with closure of his PFO as he had  shunt activity noted on TTE and TEE.  Today in clinic patient did not have desaturations in his SpO2 which is similar to Dr. Earmon Phoenix findings in clinic on 08/24/21. The patient walked a total of 5 laps without decline in his O2 today. Patient's heart rate did drop into the 40s with increased activity. Patient has HR, BP and SpO2 diary which indicates he has had a heart rate in the 30-40s since 9/16-9/17. He does complain of dizziness and feeling light headed at times.   Past Medical History:  Diagnosis Date   AICD (automatic cardioverter/defibrillator) present    Ascending aortic aneurysm (HCC)    a. CT 05/2021 showed 4.4x4.4cm TAA. Stable on chest CTA 12/2021 at 4.4cm.   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 There is severe asymmetric left ventricular hypertrophy. G1DD present.    History of COVID-19 06/07/2021   HOCM (hypertrophic obstructive cardiomyopathy) (HCC)    s/p ICD 07/2021   Hypertension    followed by cardiology, dr t. turner   (05-14-2018 nuclear study in epic , normal perfusion with nuclear ef 61%)   Left hydrocele    Low serum potassium level 04/27/2014   OSA on CPAP    per pt uses every night   Palpitations followed by dr t. Mayford Knife   06-08-2020  still feels palipations due to PVCs when exertion but not with chest pain/ discomfort   PVC's (premature ventricular contractions) cardiologist--- dr t. turner   Status post PVC  ablation by Dr. Ladona Ridgel 2019 with recurrence of frequent PVCs/bigeminy;  prior pseudobradycardia r/t pvcs   Rheumatoid arthritis involving multiple sites [facility])    rheumotology--- dr a. Sharmon Revere  ([facility] in [place])     Family History  Problem Relation Age of Onset   Cardiomyopathy Mother    Heart attack Father      Social History   Socioeconomic History   Marital status: Married    Spouse name: Not on file   Number of children: Not on file   Years of education: Not on file   Highest education level: Not on file  Occupational History   Not on file  Tobacco Use   Smoking status: Never   Smokeless tobacco: Never  Vaping Use   Vaping Use: Never used  Substance and Sexual Activity   Alcohol use: Never   Drug use: Never   Sexual activity: Not on file  Other Topics Concern   Not on file  Social History Narrative   Not on  file   Social Determinants of Health   Financial Resource Strain: Not on file  Food Insecurity: Not on file  Transportation Needs: Not on file  Physical Activity: Not on file  Stress: Not on file  Social Connections: Not on file  Intimate Partner Violence: Not on file     Allergies  Allergen Reactions   Amlodipine Besylate Swelling and Other (See Comments)    Leg swelling with 10 mg daily am dosing; decreased with BID 5 mg dosing   Aspirin Itching     Outpatient Medications Prior to Visit  Medication Sig Dispense Refill   Abatacept (ORENCIA CLICKJECT) 270 MG/ML SOAJ Inject 125 mg into the skin every Sunday.     acetaminophen-codeine (TYLENOL #3) 300-30 MG tablet Take 1 tablet by mouth 2 (two) times daily as needed for moderate pain or severe pain.     allopurinol (ZYLOPRIM) 100 MG tablet Take 100 mg by mouth daily.     apixaban (ELIQUIS) 5 MG TABS tablet Take 1 tablet (5 mg total) by mouth 2 (two) times daily. 90 tablet 1   atorvastatin (LIPITOR) 40 MG tablet Take 1 tablet (40 mg total) by mouth daily. 90 tablet 0   Cholecalciferol (VITAMIN D3) 50 MCG  (2000 UT) capsule Take 1 capsule (2,000 Units total) by mouth daily.     colchicine 0.6 MG tablet Take 2 tablets by mouth daily.     ferrous sulfate 325 (65 FE) MG tablet Take 325 mg by mouth daily.     fluticasone (FLONASE) 50 MCG/ACT nasal spray Place 1 spray into both nostrils daily. (Patient taking differently: Place 1 spray into both nostrils daily as needed for allergies.)     fluticasone-salmeterol (ADVAIR) 100-50 MCG/ACT AEPB Inhale 1 puff into the lungs 2 (two) times daily.     furosemide (LASIX) 40 MG tablet TAKE ONE TABLET BY MOUTH DAILY FOR THREE DAYS **THEN TAKE AS NEEDED FOR INCREASE WEIGHT GAIN** (Patient taking differently: Take 40 mg by mouth daily as needed for fluid or edema (weight gain).) 90 tablet 3   gabapentin (NEURONTIN) 600 MG tablet Take 600 mg by mouth 3 (three) times daily.     hydrALAZINE (APRESOLINE) 100 MG tablet Take 1 tablet (100 mg total) by mouth 3 (three) times daily. 270 tablet 0   loratadine (CLARITIN) 10 MG tablet Take 10 mg by mouth daily.     Magnesium Oxide 400 MG CAPS Take 1 capsule (400 mg total) by mouth every other day. While taking daily multivitamin with 100mg  magnesium every day 90 capsule 2   montelukast (SINGULAIR) 10 MG tablet Take 1 tablet (10 mg total) by mouth at bedtime. 30 tablet 3   Multiple Vitamin (MULTIVITAMIN WITH MINERALS) TABS tablet Take 1 tablet by mouth daily.     potassium chloride SA (KLOR-CON M) 20 MEQ tablet Take 1 tablet by  mouth only when you have to use a Lasix (Patient taking differently: Take 20 mEq by mouth daily as needed (with each dose of Lasix/furosemide).) 90 tablet 3   predniSONE (DELTASONE) 5 MG tablet Take 5 mg by mouth daily with breakfast.     sotalol (BETAPACE) 120 MG tablet Take 1 tablet (120 mg total) by mouth every 12 (twelve) hours. 60 tablet 8   spironolactone (ALDACTONE) 25 MG tablet TAKE ONE TABLET BY MOUTH DAILY (Patient taking differently: Take 25 mg by mouth daily.) 90 tablet 2   albuterol (VENTOLIN  HFA) 108 (90 Base) MCG/ACT inhaler Inhale 2 puffs into the lungs every 4 (  four) hours as needed for wheezing or shortness of breath. 8 g 1   docusate sodium (COLACE) 100 MG capsule Take 1 capsule (100 mg total) by mouth every 12 (twelve) hours. (Patient not taking: Reported on 11/30/2022) 60 capsule 0   folic acid (FOLVITE) 1 MG tablet Take 1 tablet by mouth daily.     oxyCODONE (OXY IR/ROXICODONE) 5 MG immediate release tablet Take 1 tablet (5 mg total) by mouth 4 (four) times daily as needed for moderate pain. (Patient not taking: Reported on 11/30/2022) 30 tablet 0   polyethylene glycol (MIRALAX) 17 g packet Take 17 g by mouth 2 (two) times daily. 14 each 0   Sennosides (EX-LAX PO) Take 1 tablet by mouth daily as needed (constipation). (Patient not taking: Reported on 11/30/2022)     traMADol (ULTRAM) 50 MG tablet Take 50 mg by mouth every 6 (six) hours as needed. (Patient not taking: Reported on 11/30/2022)     No facility-administered medications prior to visit.   Review of Systems  Constitutional:  Negative for chills, fever, malaise/fatigue and weight loss.  HENT:  Negative for congestion and sore throat.   Eyes: Negative.   Respiratory:  Positive for shortness of breath. Negative for cough, hemoptysis, sputum production and wheezing.   Cardiovascular:  Negative for chest pain, palpitations, orthopnea, claudication and leg swelling.  Gastrointestinal:  Negative for abdominal pain, heartburn, nausea and vomiting.  Genitourinary: Negative.   Musculoskeletal: Negative.   Neurological:  Negative for dizziness, weakness and headaches.  Endo/Heme/Allergies: Negative.   Psychiatric/Behavioral: Negative.      Objective:   Vitals:   01/08/23 1557  BP: 118/76  Pulse: 85  SpO2: 93%  Weight: 238 lb 12.8 oz (108.3 kg)  Height: 5\' 8"  (1.727 m)    Physical Exam Constitutional:      General: He is not in acute distress.    Appearance: He is obese.  HENT:     Head: Normocephalic and  atraumatic.  Cardiovascular:     Rate and Rhythm: Normal rate and regular rhythm.     Pulses: Normal pulses.     Heart sounds: Normal heart sounds. No murmur heard. Pulmonary:     Effort: Pulmonary effort is normal.     Breath sounds: Normal breath sounds. No wheezing, rhonchi or rales.  Musculoskeletal:     Right lower leg: Edema (2+) present.     Left lower leg: Edema present.  Skin:    General: Skin is warm and dry.  Neurological:     General: No focal deficit present.     Mental Status: He is alert.  Psychiatric:        Mood and Affect: Mood normal.        Behavior: Behavior normal.        Thought Content: Thought content normal.        Judgment: Judgment normal.     CBC    Component Value Date/Time   WBC 10.5 10/01/2022 1055   WBC 10.6 (H) 06/28/2022 0922   RBC 4.61 10/01/2022 1055   RBC 3.58 (L) 06/28/2022 0922   HGB 13.6 10/01/2022 1055   HCT 42.1 10/01/2022 1055   PLT 256 10/01/2022 1055   MCV 91 10/01/2022 1055   MCH 29.5 10/01/2022 1055   MCH 30.4 06/28/2022 0922   MCHC 32.3 10/01/2022 1055   MCHC 32.7 06/28/2022 0922   RDW 13.6 10/01/2022 1055   LYMPHSABS 1.2 10/01/2022 1055   MONOABS 0.7 06/28/2022 0922   EOSABS 0.0 10/01/2022  1055   BASOSABS 0.0 10/01/2022 1055      Latest Ref Rng & Units 10/01/2022   10:55 AM 06/28/2022    9:22 AM 06/27/2022    4:38 AM  BMP  Glucose 70 - 99 mg/dL 91  101  126   BUN 8 - 27 mg/dL 17  34  40   Creatinine 0.76 - 1.27 mg/dL 1.28  1.21  1.35   BUN/Creat Ratio 10 - 24 13     Sodium 134 - 144 mmol/L 135  136  140   Potassium 3.5 - 5.2 mmol/L 4.7  3.5  3.7   Chloride 96 - 106 mmol/L 98  108  107   CO2 22 - 32 mmol/L  19  21   Calcium 8.6 - 10.2 mg/dL 9.2  8.8  9.5    Chest imaging: HRCT Chest 06/07/21 1. Bland appearing, bandlike scarring and or partial atelectasis of the dependent lungs, unchanged compared to prior examination. No evidence of fibrotic interstitial lung disease. No significant air trapping on  expiratory phase imaging. 2. Cardiomegaly and coronary artery disease. 3. Unchanged enlargement of the tubular ascending thoracic aorta, measuring up to 4.4 x 4.4 cm. Recommend annual imaging followup by CTA or MRA if not otherwise imaged.  CXR 07/12/20 There are low lung volumes with central vascular crowding and additional bandlike opacities in the periphery of the left mid lung which could reflect subsegmental atelectasis or scarring. More patchy right infrahilar opacity with airways thickening is present. No pneumothorax or visible effusion. Cardiomediastinal contours are unremarkable. No acute osseous or soft tissue abnormality. Degenerative changes are present in the imaged spine and shoulders. Telemetry leads overlie the chest.  PFT:    Latest Ref Rng & Units 08/01/2021    2:57 PM 11/16/2020   10:00 AM  PFT Results  FVC-Pre L 3.75  4.14   FVC-Predicted Pre % 84  92   FVC-Post L 3.65  4.01   FVC-Predicted Post % 82  89   Pre FEV1/FVC % % 78  82   Post FEV1/FCV % % 82  85   FEV1-Pre L 2.91  3.38   FEV1-Predicted Pre % 87  101   FEV1-Post L 2.99  3.40   DLCO uncorrected ml/min/mmHg 23.24  18.55   DLCO UNC% % 89  71   DLCO corrected ml/min/mmHg 25.93  18.55   DLCO COR %Predicted % 99  71   DLVA Predicted % 100  86   TLC L 6.95  7.23   TLC % Predicted % 102  106   RV % Predicted % 126  121   PFT 2021: Mild diffusion defect.  PFT 2022: within normal limits  Echo:  08/18/21 - TEE LVEF 55-60%. Mild left ventricular hypertrophy. RV systolic function is moderately reduced. RV is moderately enlarged. Atrial level shunt noted by color flow and agitated saline. RA mildly dilated.   05/26/21 LVEF 45 to 50%.  Severe asymmetric left ventricular hypertrophy.  Grade 1 diastolic dysfunction.  RV systolic function is moderately reduced.  RV size is moderately enlarged.  Moderately elevated pulmonary arterial systolic pressure.  Left atrium size is moderately dilated.  Right atrial size is  mildly dilated.  02/08/21 LVEF 60-65%. Moderate LVH. RV systolic function is normal. RV is mildly enlarged.      Assessment & Plan:   No diagnosis found.  Discussion: Ryan Glass is a 67 year old male, never smoker with hypertension, rheumatoid arthritis reactive airways disease and obstructive sleep apnea on  CPAP who returns to pulmonary clinic for shortness of breath.   His respiratory status appears stable at this time with as needed albuterol and montelukast daily.   He needs to have a CPAP titration study performed to ensure he is on adequate sleep apnea treatment. Order has been placed.  Follow up in 6 months.  Melody Comas, MD [facility] Office: [phone]     Current Outpatient Medications:    Abatacept (ORENCIA CLICKJECT) 125 MG/ML SOAJ, Inject 125 mg into the skin every Sunday., Disp: , Rfl:    acetaminophen-codeine (TYLENOL #3) 300-30 MG tablet, Take 1 tablet by mouth 2 (two) times daily as needed for moderate pain or severe pain., Disp: , Rfl:    allopurinol (ZYLOPRIM) 100 MG tablet, Take 100 mg by mouth daily., Disp: , Rfl:    apixaban (ELIQUIS) 5 MG TABS tablet, Take 1 tablet (5 mg total) by mouth 2 (two) times daily., Disp: 90 tablet, Rfl: 1   atorvastatin (LIPITOR) 40 MG tablet, Take 1 tablet (40 mg total) by mouth daily., Disp: 90 tablet, Rfl: 0   Cholecalciferol (VITAMIN D3) 50 MCG (2000 UT) capsule, Take 1 capsule (2,000 Units total) by mouth daily., Disp: , Rfl:    colchicine 0.6 MG tablet, Take 2 tablets by mouth daily., Disp: , Rfl:    ferrous sulfate 325 (65 FE) MG tablet, Take 325 mg by mouth daily., Disp: , Rfl:    fluticasone (FLONASE) 50 MCG/ACT nasal spray, Place 1 spray into both nostrils daily. (Patient taking differently: Place 1 spray into both nostrils daily as needed for allergies.), Disp: , Rfl:    fluticasone-salmeterol (ADVAIR) 100-50 MCG/ACT AEPB, Inhale 1 puff into the lungs 2 (two) times daily., Disp: , Rfl:     furosemide (LASIX) 40 MG tablet, TAKE ONE TABLET BY MOUTH DAILY FOR THREE DAYS **THEN TAKE AS NEEDED FOR INCREASE WEIGHT GAIN** (Patient taking differently: Take 40 mg by mouth daily as needed for fluid or edema (weight gain).), Disp: 90 tablet, Rfl: 3   gabapentin (NEURONTIN) 600 MG tablet, Take 600 mg by mouth 3 (three) times daily., Disp: , Rfl:    hydrALAZINE (APRESOLINE) 100 MG tablet, Take 1 tablet (100 mg total) by mouth 3 (three) times daily., Disp: 270 tablet, Rfl: 0   loratadine (CLARITIN) 10 MG tablet, Take 10 mg by mouth daily., Disp: , Rfl:    Magnesium Oxide 400 MG CAPS, Take 1 capsule (400 mg total) by mouth every other day. While taking daily multivitamin with 100mg  magnesium every day, Disp: 90 capsule, Rfl: 2   montelukast (SINGULAIR) 10 MG tablet, Take 1 tablet (10 mg total) by mouth at bedtime., Disp: 30 tablet, Rfl: 3   Multiple Vitamin (MULTIVITAMIN WITH MINERALS) TABS tablet, Take 1 tablet by mouth daily., Disp: , Rfl:    potassium chloride SA (KLOR-CON M) 20 MEQ tablet, Take 1 tablet by  mouth only when you have to use a Lasix (Patient taking differently: Take 20 mEq by mouth daily as needed (with each dose of Lasix/furosemide).), Disp: 90 tablet, Rfl: 3   predniSONE (DELTASONE) 5 MG tablet, Take 5 mg by mouth daily with breakfast., Disp: , Rfl:    sotalol (BETAPACE) 120 MG tablet, Take 1 tablet (120 mg total) by mouth every 12 (twelve) hours., Disp: 60 tablet, Rfl: 8   spironolactone (ALDACTONE) 25 MG tablet, TAKE ONE TABLET BY MOUTH DAILY (Patient taking differently: Take 25 mg by mouth daily.), Disp: 90 tablet, Rfl: 2   albuterol (VENTOLIN  HFA) 108 (90 Base) MCG/ACT inhaler, Inhale 2 puffs into the lungs every 4 (four) hours as needed for wheezing or shortness of breath., Disp: 8 g, Rfl: 1

## 2023-01-08 NOTE — Patient Instructions (Addendum)
Start prednisone taper: 4 tabs x 3 days 3 tabs x 3 days 2 tabs x 3 days 1 tab x 3 days  Start augmentin antibiotic 1 tab twice daily for 7 days  Use advair inhaler again, 1 puff twice daily  Continue to use albuterol inhaler 1-2 puffs every 4-6 hours as needed  We will check on the overnight oxygen test to do with your CPAP machine.   Follow up in 6 months, call sooner if needed

## 2023-01-09 ENCOUNTER — Other Ambulatory Visit: Payer: No Typology Code available for payment source | Admitting: Occupational Medicine

## 2023-01-09 ENCOUNTER — Encounter: Payer: Self-pay | Admitting: Registered Nurse

## 2023-01-09 ENCOUNTER — Telehealth: Payer: Self-pay | Admitting: Registered Nurse

## 2023-01-09 DIAGNOSIS — J4531 Mild persistent asthma with (acute) exacerbation: Secondary | ICD-10-CM

## 2023-01-09 DIAGNOSIS — J4521 Mild intermittent asthma with (acute) exacerbation: Secondary | ICD-10-CM

## 2023-01-09 DIAGNOSIS — D62 Acute posthemorrhagic anemia: Secondary | ICD-10-CM

## 2023-01-09 DIAGNOSIS — R0600 Dyspnea, unspecified: Secondary | ICD-10-CM

## 2023-01-09 DIAGNOSIS — M7989 Other specified soft tissue disorders: Secondary | ICD-10-CM

## 2023-01-09 NOTE — Progress Notes (Signed)
Lab drawn from Left AC tolerated well no issues noted. Patient reports stuffy nose nasal congestion for 2 weeks not improved going in to chest. Saw Pulmonologist yesterday put him on Augmentin and prednisone. VSS. Lung sounds clear but very diminished. Encourage to use inhaler. Edema to legs is better today after taking lasix only 1+ edema bilateral. Educated if worsens or not improving to let clinic know. Covid negative.

## 2023-01-09 NOTE — Telephone Encounter (Signed)
Patient came to clinic to request refill magnesium.  Taking daily not every other day.  Still taking multivitamin also.  Last magnesium level was on same dose/supplements  1.9 Oct 23/23.  Was started on supplementation while hospitalized due to low level.  Discussed next recheck nonfasting magnesium level due October sooner if symptoms e.g. palpitations/diarrhea/muscle cramps.  Electronic Rx sent to his pharmacy of choice magnesium oxide 400mg  po every other day #90 RF2  Patient to schedule lab with RN Kimrey.  Patient denied hypo/hypermagnesemia symptoms at this time.  A&Ox3 spoke full sentences without difficulty gait sure and steady in clinic; no observed wheezing/shortness of breath or cough.  Patient verbalized understanding information/instructions, agreed with plan of care and had no further questions at this time.

## 2023-01-09 NOTE — Telephone Encounter (Signed)
RN Kimrey notified me patient seen in clinic today breath sounds diminished had started augmentin and prednisone taper prescribed by Dr Erin Fulling.  Encouraged to use albuterol inhaler by RN Evlyn Kanner today and scheduled for follow up appt with me tomorrow.  Home covid test negative today.  Patient complaining of congestion.

## 2023-01-09 NOTE — Telephone Encounter (Signed)
Patient seen by pulmonology 01/08/23 diagnosed with pneumonia and started on prednisone taper and augmentin.  Schedule patient for follow up with me 01/10/23 clinic

## 2023-01-10 ENCOUNTER — Encounter: Payer: Self-pay | Admitting: Registered Nurse

## 2023-01-10 ENCOUNTER — Encounter: Payer: Self-pay | Admitting: Pulmonary Disease

## 2023-01-10 ENCOUNTER — Ambulatory Visit: Payer: No Typology Code available for payment source | Admitting: Registered Nurse

## 2023-01-10 ENCOUNTER — Ambulatory Visit: Payer: No Typology Code available for payment source | Admitting: Occupational Medicine

## 2023-01-10 VITALS — BP 135/103 | HR 89 | Temp 98.9°F

## 2023-01-10 DIAGNOSIS — J4531 Mild persistent asthma with (acute) exacerbation: Secondary | ICD-10-CM

## 2023-01-10 DIAGNOSIS — Z0000 Encounter for general adult medical examination without abnormal findings: Secondary | ICD-10-CM

## 2023-01-10 DIAGNOSIS — I10 Essential (primary) hypertension: Secondary | ICD-10-CM

## 2023-01-10 DIAGNOSIS — Z8639 Personal history of other endocrine, nutritional and metabolic disease: Secondary | ICD-10-CM

## 2023-01-10 DIAGNOSIS — R0609 Other forms of dyspnea: Secondary | ICD-10-CM

## 2023-01-10 LAB — BASIC METABOLIC PANEL
BUN/Creatinine Ratio: 16 (ref 10–24)
BUN: 18 mg/dL (ref 8–27)
CO2: 19 mmol/L — ABNORMAL LOW (ref 20–29)
Calcium: 9.1 mg/dL (ref 8.6–10.2)
Chloride: 105 mmol/L (ref 96–106)
Creatinine, Ser: 1.14 mg/dL (ref 0.76–1.27)
Glucose: 64 mg/dL — ABNORMAL LOW (ref 70–99)
Potassium: 4.4 mmol/L (ref 3.5–5.2)
Sodium: 140 mmol/L (ref 134–144)
eGFR: 71 mL/min/{1.73_m2} (ref >59)

## 2023-01-10 LAB — GLUCOSE, POCT (MANUAL RESULT ENTRY): POC Glucose: 118 mg/dl — AB (ref 70–99)

## 2023-01-10 NOTE — Progress Notes (Signed)
Be well insurance premium discount evaluation:   Patient epic reviewed by RN Evlyn Kanner and transcribed. Labs  Tobacco attestation signed. [facility] ROI formed signed. Forms placed in the chart.   Patient given handouts for MyChart, Tele doc Went over instructions to contact clinic.   What to do for infectious illness protocol. Given handout for list of medications that can be filled at [facility]. Clinic hours.

## 2023-01-10 NOTE — Progress Notes (Signed)
Subjective:    Patient ID: Ryan Glass, male    DOB: 09-30-1956, 67 y.o.   MRN: [id number]  66y/o married [place] established male patient here for follow up/re-evaluation s/p diagnosis pneumonia at pulmonology 01/08/23.  Taking his augmentin and prednisone taper 40mg .  Feeling fatigued some nasal discharge and body aches.  Shortness of breath with exertion.  Found his sp02 monitor at home and levels above 90 at home.  Using his advair discus twice a day and albuterol as needed.  Last used albuterol upon waking this am.  Would like refill nasal saline from clinic stock.  Patient has not reviewed how many pills remaining at home for [facility] refills will do so this week as anticipating travel later this month.  Denied fever/chills/productive cough/nausea/vomiting/diarrhea/chest pain.  Read my chart message for lab results and reported ate breakfast yesterday unsure why blood sugar low.  Requested verification kidney function and electrolytes were normal on blood specimen yesterday.  Negative home covid test yesterday.  Last positive test July 2023.  Did receive flu vaccination this year.  Denied known sick contacts.  Patient here to sign Be Well paperwork with RN Evlyn Kanner also labs completed Oct 2023.      Review of Systems  Constitutional:  Positive for fatigue. Negative for activity change, appetite change, chills, diaphoresis and fever.  HENT:  Positive for congestion, postnasal drip and rhinorrhea. Negative for dental problem, drooling, ear discharge, ear pain, facial swelling, hearing loss, mouth sores, nosebleeds, sinus pressure, sinus pain, sneezing, sore throat, tinnitus, trouble swallowing and voice change.   Eyes:  Negative for photophobia and visual disturbance.  Respiratory:  Positive for shortness of breath. Negative for choking, wheezing and stridor.   Cardiovascular:  Negative for chest pain and leg swelling.  Gastrointestinal:  Negative for diarrhea and vomiting.  Genitourinary:   Negative for difficulty urinating.  Musculoskeletal:  Negative for gait problem and neck stiffness.  Skin:  Negative for rash.  Allergic/Immunologic: Positive for environmental allergies.  Neurological:  Negative for dizziness, tremors, seizures, syncope, facial asymmetry, speech difficulty, light-headedness and headaches.  Psychiatric/Behavioral:  Negative for agitation, confusion and sleep disturbance.        Objective:   Physical Exam Vitals and nursing note reviewed.  Constitutional:      General: He is awake. He is not in acute distress.    Appearance: Normal appearance. He is well-developed and well-groomed. He is obese. He is not ill-appearing, toxic-appearing or diaphoretic.  HENT:     Head: Normocephalic and atraumatic.     Jaw: There is normal jaw occlusion.     Salivary Glands: Right salivary gland is not diffusely enlarged or tender. Left salivary gland is not diffusely enlarged or tender.     Comments: Bilateral lower eyelids nonpitting edema 1-2+/4; allergic shiners bilateral; cobblestoning posterior pharynx; clear discharge bilateral nares    Right Ear: Hearing, ear canal and external ear normal. No decreased hearing noted. No laceration, drainage, swelling or tenderness. A middle ear effusion is present. There is no impacted cerumen. No foreign body. No mastoid tenderness. No PE tube. No hemotympanum. Tympanic membrane is not injected, scarred, perforated, erythematous or retracted.     Left Ear: Hearing, ear canal and external ear normal. No decreased hearing noted. No laceration, drainage, swelling or tenderness. A middle ear effusion is present. There is no impacted cerumen. No foreign body. No mastoid tenderness. No PE tube. No hemotympanum. Tympanic membrane is not injected, scarred, perforated, erythematous or retracted.  Nose: Mucosal edema present. No nasal tenderness, congestion or rhinorrhea.     Right Turbinates: Enlarged and swollen. Not pale.     Left  Turbinates: Enlarged and swollen. Not pale.     Right Sinus: No maxillary sinus tenderness or frontal sinus tenderness.     Left Sinus: No maxillary sinus tenderness or frontal sinus tenderness.     Mouth/Throat:     Lips: Pink. No lesions.     Mouth: Mucous membranes are moist. No oral lesions or angioedema.     Dentition: Abnormal dentition. No gum lesions.     Tongue: No lesions. Tongue does not deviate from midline.     Palate: No mass and lesions.     Pharynx: Uvula midline. Pharyngeal swelling and posterior oropharyngeal erythema present. No uvula swelling.     Tonsils: No tonsillar exudate. 0 on the right. 0 on the left.  Eyes:     General: Lids are normal. Vision grossly intact. Gaze aligned appropriately. Allergic shiner present. No scleral icterus.       Right eye: No discharge.        Left eye: No discharge.     Extraocular Movements: Extraocular movements intact.     Right eye: Normal extraocular motion and no nystagmus.     Left eye: Normal extraocular motion and no nystagmus.     Conjunctiva/sclera: Conjunctivae normal.     Right eye: Right conjunctiva is not injected. No chemosis, exudate or hemorrhage.    Left eye: Left conjunctiva is not injected. No chemosis, exudate or hemorrhage.    Pupils: Pupils are equal, round, and reactive to light.  Neck:     Trachea: Trachea and phonation normal. No tracheal deviation.  Cardiovascular:     Rate and Rhythm: Normal rate and regular rhythm.     Pulses: Normal pulses.          Radial pulses are 2+ on the right side and 2+ on the left side.     Heart sounds: Normal heart sounds.  Pulmonary:     Effort: Pulmonary effort is normal. No respiratory distress.     Breath sounds: Normal breath sounds and air entry. No stridor, decreased air movement or transmitted upper airway sounds. No decreased breath sounds, wheezing, rhonchi or rales.     Comments: Spoke full sentences without difficulty; no cough observed in exam room; increased  respiratory rate on arrival to clinic after ambulating across warehouse to clinic from [place] speaking in short phrases; after sitting speaking full sentences without difficulty Abdominal:     Palpations: Abdomen is soft.  Musculoskeletal:        General: No swelling, tenderness or deformity.     Right hand: Normal strength. Normal capillary refill.     Left hand: Normal strength. Normal capillary refill.     Cervical back: Neck supple. No swelling, edema, deformity, erythema, signs of trauma, lacerations, rigidity, torticollis, tenderness or crepitus. No pain with movement or muscular tenderness. Decreased range of motion.     Thoracic back: No swelling, edema, deformity, signs of trauma, lacerations or tenderness.     Right lower leg: No edema.     Left lower leg: No edema.  Lymphadenopathy:     Head:     Right side of head: No submental, submandibular, tonsillar, preauricular, posterior auricular or occipital adenopathy.     Left side of head: No submental, submandibular, tonsillar, preauricular, posterior auricular or occipital adenopathy.     Cervical: No cervical adenopathy.  Right cervical: No superficial, deep or posterior cervical adenopathy.    Left cervical: No superficial, deep or posterior cervical adenopathy.  Skin:    General: Skin is warm and dry.     Capillary Refill: Capillary refill takes less than 2 seconds.     Coloration: Skin is not ashen, cyanotic, jaundiced, mottled, pale or sallow.     Findings: No abrasion, abscess, acne, bruising, burn, ecchymosis, erythema, signs of injury, laceration, lesion, petechiae, rash or wound.     Nails: There is no clubbing.  Neurological:     General: No focal deficit present.     Mental Status: He is alert and oriented to person, place, and time. Mental status is at baseline.     GCS: GCS eye subscore is 4. GCS verbal subscore is 5. GCS motor subscore is 6.     Cranial Nerves: Cranial nerves 2-12 are intact. No cranial nerve  deficit, dysarthria or facial asymmetry.     Sensory: Sensation is intact.     Motor: Motor function is intact. No weakness, tremor, atrophy, abnormal muscle tone or seizure activity.     Coordination: Coordination is intact. Coordination normal.     Gait: Gait is intact. Gait normal.     Comments: In/out of chair without difficulty; gait sure and steady in clinic; bilateral hand grasp equal 5/5  Psychiatric:        Attention and Perception: Attention and perception normal.        Mood and Affect: Mood and affect normal.        Speech: Speech normal.        Behavior: Behavior normal. Behavior is cooperative.        Thought Content: Thought content normal.        Cognition and Memory: Cognition and memory normal.        Judgment: Judgment normal.          Assessment & Plan:   A-acute asthma exacerbation, history of hypoglycemia, elevated blood pressure in clinic  P- Be Well 2025 paperwork completed with RN Evlyn Kanner today see outpatient record [facility] and [facility] Alexis notified patient met discount requirements.    Patient using advair diskus 1 puff po BID and albuterol inhalers 2 puffs po prn this week along with taking augmentin 875mg  po BID and prednisone 40mg  taper from Dr Erin Fulling pulmonology.  Sp02 90-94% in clinic today RA highest when talking lowest at rest. BBS CTA no wheezing/rhonchi/rhales. Dyspnea with exertion/walking across warehouse today.  No leg edema on exam.  Discussed prednisone increasing heart rate and blood pressure today along with illness. Take his prescribed blood pressure/medications as prescribed every day.  Continue checking daily weights and prn furosemide use if weight gain/leg swelling. Continue using albuterol inhaler today and tomorrow 2 puffs every 4 hours then to prn.  Finish all of augmentin and prednisone taper as prescribed.  Stay inside as much as possible when temperatures near freezing.  Consider humidification at home.  Ensure drinking water due to  increased loss from runny nose.  Repeat covid test tomorrow given 1 free [place] govt test to use at home tomorrow.  Notify clinic staff if fever or new symptoms develop.  Follow up with provider for re-evaluation this weekend if sp02 at home consistently under 90% despite rest/cpap/meds/incentive spirometry use. Discussed rhinitis most likely viral/cold weather related.  Use flonase nasal 1 spray each nostril BID, singulair 10mg  po qhs, nasal saline 2 sprays each nostril q2h prn congestion/thick mucous.   Patient  verbalized understanding information/instructions, agreed with plan of  care and had no further questions at this time.  POCT glucose check.  Patient did eat breakfast prior to labs yesterday unsure why low discussed infection/illness increased calorie needs.  Avoid skipping meals/dehydration.  Reiterated electrolytes and kidney function normal on recheck yesterday.  POCT normal nonfasting today notified while in clinic had drank hot chocolate prior to clinic appt.  Patient agreed with plan of care and had no further questions at this time.  Patient will check medications at home and notify staff which will be running low/need refill from [facility] prior to travel to [place] later this month.

## 2023-01-10 NOTE — Progress Notes (Signed)
Covid test given to do at home tomorrow.

## 2023-01-11 MED ORDER — ALBUTEROL SULFATE HFA 108 (90 BASE) MCG/ACT IN AERS: 2.0000 | INHALATION_SPRAY | RESPIRATORY_TRACT | 1 refills | Status: DC | PRN

## 2023-01-11 NOTE — Patient Instructions (Addendum)
Ensure using albuterol inhaler 2 puffs every 4 hours x 48 hours then switch to as needed but if sp02 still lower than baseline use at least am/bedtime on schedule and as needed during the day Cover mouth with scarf when outside during near freezing temperatures or wear mask/limit time outside Check oxygen level at home with portable sp02 monitor and follow up with provider if consistently below 90% Wear CPAP when napping or sleeping Use your incentive spirometer twice a day  Check your medication levels at home and notify clinic staff which will need to be refilled prior to travel to [place]  Asthma Triggers and Exacerbation In this video, you will learn about exposures and other factors that can lead to asthma attacks and trouble controlling asthma. To view the content, go to this web address: [email or web address]  This video will expire on: 08/14/2024. If you need access to this video following this date, please reach out to the healthcare provider who assigned it to you. This information is not intended to replace advice given to you by your health care provider. Make sure you discuss any questions you have with your health care provider. [employer] Patient Education  [place].

## 2023-01-14 ENCOUNTER — Ambulatory Visit: Payer: No Typology Code available for payment source | Admitting: Occupational Medicine

## 2023-01-14 VITALS — HR 88

## 2023-01-14 DIAGNOSIS — J209 Acute bronchitis, unspecified: Secondary | ICD-10-CM

## 2023-01-14 NOTE — Progress Notes (Signed)
Patient reports feeling much better. Looking like feels better more like self. Patients lung sounds have improved. 95-97 when moving 90-93 when sitting. Patient is going to let me know when he needs potassium refilled.

## 2023-01-16 NOTE — Progress Notes (Unsigned)
Office Visit    Patient Name: Ryan Glass Date of Encounter: 01/17/2023  PCP:  [name], MD   [facility]  Cardiologist:  [name], MD  Advanced Practice Provider:  No care team member to display Electrophysiologist:  [name], MD   HPI    Ryan Glass is a 67 y.o. male with a past medical history significant for PVCs status post PVC ablation with Dr. 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  He was last seen by Dr. Mayford Knife 01/26/2022.  History includes hospitalization for chest pain 05/26/2021.  Found to have acute PE with RV failure mildly reduced LVEF 45 to 50%.  Right and left heart catheterization 05/29/2021 showed 20% proximal RCA, 20% proximal to mid circumflex, 30% mid LAD with normal right and left heart pressures.  His shortness of breath was felt to be out of proportion to his very small PE.  Echocardiogram images were reviewed and felt RV was mildly dilated but RV EF was normal.  LVEF was normal.  He was referred to pulmonology for ongoing shortness of breath and restarted on diltiazem 180 mg daily and Lasix was stopped because he was not fluid overloaded.  He was restarted on chlorthalidone 25 mg daily for hypertension.  He was seen August 2022 by Herma Carson, and was still complaining of DOE with little activity.  He was also complaining of low heart rates.  He saw pulmonary for dyspnea on exertion/COPD and placed on O2 by pulmonary.  O2 sats were in the 80s with walking.  Noted on EKG to have bradycardia and frequent PVCs.  Also complaining of leg swelling but had been eating fast food at least 3 days a week.  He wore a heart monitor to assess bradycardia and PVC load and was noted to have symptomatic ventricular  tachycardia on monitor as well as PAF and was admitted to the ER.  Echo showed normal LVEF 66 5% with mild LVH, mildly reduced RV EF and mild RVE and positive PFO.  Started on verapamil for atrial fibrillation/VT/PVCs and monitored on telemetry.  Cardiac MRI showed findings consistent with HOCM and LVEF 52% and RVEF 45%.  He underwent ICD placement 08/07/2021 and was discharged on carvedilol and verapamil.  He was hospitalized October 2022 with worsening shortness of breath with acute hypoxic respiratory failure.  He was eventually weaned off of oxygen and was hypovolemic on admission.  Unfortunately he was found to have GI bleed and underwent EGD showing gastric ulcer and gastritis.  Hemoglobin was stabilized at discharge.  Recommended to hold aspirin until ulcer treatment was completed.  His losartan had been held because of soft blood pressures in the hospital.  Chlorthalidone was stopped and he was continued on oral Lasix and spironolactone.  He was seen by Dr. Excell Seltzer 11/2021 for follow-up of his PFO.  At that time he was no longer hypoxemic and was felt that his chronic dyspnea was multifactorial from chronic CHF, obesity, and deconditioning.  Not felt that transcatheter PFO closure would benefit him.  Also referred to Dr. Richarda Blade for HOCM.  It was recommended that alpha galactosidase level be checked to r/o Fabry's disease.  Genetic testing was also discussed but family wanted to wait until they saw him back in 3 months.  He was last seen by Dr.  Turner 01/26/2022 and was seen by [facility] cardiology and EP for second opinion.  They were concerned that his symptoms may be related to PVCs.  They felt that he did not need to beta-blockers and his metoprolol was stopped but he was continued on sotalol despite having HOCM.  Continued to have shortness of breath.  Not on oxygen.  Daughter was frustrated that nobody could find the cause of his shortness of breath and his oxygen at home intermittently drop down  to 89%.  Denied chest pain or pressure.  Denied palpitations or syncope.  Compliant with medications.  Chronic lower extremity edema.  Today, he states he has been feeling much better than the previous year.  Feels good today.  He had his knees replaced May and then July of last year.  He did physical therapy and did well with that.  His last CT scan of his aorta was a year ago so he is due for an annual scan.  Ascending aortic aneurysm measured 4.4 cm at that time.  We discussed tight blood pressure control and he is encouraged to monitor this at home.  He has been compliant with his medications.  We have provided refills today.  He is asked for sildenafil prescription today.  He plans to go to [place] at the end of this month through March so we will try to get his CT scan done before then.  Reports no shortness of breath nor dyspnea on exertion. Reports no chest pain, pressure, or tightness. No edema, orthopnea, PND. Reports no palpitations.    Past Medical History    Past Medical History:  Diagnosis Date   AICD (automatic cardioverter/defibrillator) present    Ascending aortic aneurysm ([place])    a. CT 05/2021 showed 4.4x4.4cm TAA. Stable on chest CTA 12/2021 at 4.4cm.   Atrial fibrillation (HCC)    Bilateral lower extremity edema    Celiac artery dilatation (HCC)    1.8cm at the celiac trunk on Chest CT 12/2021   CHF (congestive heart failure) (HCC)    Chronic gout without tophus    followed by dr Gerilyn Nestle    (06-08-2020  per pt last episode right knee 3 wks ago)   CKD (chronic kidney disease), stage II    nephrology--- Jen Mow PA (10-29-2019 note in epic scanned in  media)   Coronary artery disease    Heart failure with mid-range ejection fraction ([place]) 05/27/2021   05/26/21: Left ventricular ejection fraction, by estimation, is 45 to 50%. There is severe asymmetric left ventricular hypertrophy. G1DD present.    History of COVID-19 06/07/2021   HOCM (hypertrophic obstructive  cardiomyopathy) ([place])    s/p ICD 07/2021   Hypertension    followed by cardiology, dr t. turner   (05-14-2018 nuclear study in epic , normal perfusion with nuclear ef 61%)   Left hydrocele    Low serum potassium level 04/27/2014   OSA on CPAP    per pt uses every night   Palpitations followed by dr t. [name]   06-08-2020  still feels palipations due to PVCs when exertion but not with chest pain/ discomfort   PVC's (premature ventricular contractions) cardiologist--- dr t. turner   Status post PVC ablation by [name] 2019 with recurrence of frequent PVCs/bigeminy;  prior pseudobradycardia r/t pvcs   Rheumatoid arthritis involving multiple sites [facility])    rheumotology--- dr a. Gerilyn Nestle  ([facility] in [place])   Past Surgical History:  Procedure Laterality Date   BIOPSY  10/08/2021  Procedure: BIOPSY;  Surgeon: Jenel Lucks, MD;  Location: [facility] ENDOSCOPY;  Service: Gastroenterology;;   Thressa Sheller STUDY  08/18/2021   Procedure: BUBBLE STUDY;  Surgeon: Wendall Stade, MD;  Location: [facility] ENDOSCOPY;  Service: Cardiovascular;;   ESOPHAGOGASTRODUODENOSCOPY N/A 10/08/2021   Procedure: ESOPHAGOGASTRODUODENOSCOPY (EGD);  Surgeon: Jenel Lucks, MD;  Location: [facility] ENDOSCOPY;  Service: Gastroenterology;  Laterality: N/A;   HYDROCELE EXCISION Left 06/14/2020   Procedure: LEFT  HYDROCELECTOMY ADULT;  Surgeon: Noel Christmas, MD;  Location: [facility];  Service: Urology;  Laterality: Left;   ICD IMPLANT N/A 08/07/2021   Procedure: ICD IMPLANT;  Surgeon: Marinus Maw, MD;  Location: [facility] INVASIVE CV LAB;  Service: Cardiovascular;  Laterality: N/A;   INCISIONAL HERNIA REPAIR  02-23-2016   @[facility]    LAPAROSCOPIC   LAPAROSCOPIC INGUINAL HERNIA REPAIR Bilateral 08-22-2015  @[facility]    AND UMBILICAL HERNIA REPAIR   PVC ABLATION N/A 10/07/2018   Procedure: PVC ABLATION;  Surgeon: , MD;  Location: [facility] INVASIVE CV LAB;  Service: Cardiovascular;  Laterality: N/A;   RIGHT/LEFT HEART CATH  AND CORONARY ANGIOGRAPHY N/A 05/29/2021   Procedure: RIGHT/LEFT HEART CATH AND CORONARY ANGIOGRAPHY;  Surgeon: Marinus Maw, MD;  Location: [facility] INVASIVE CV LAB;  Service: Cardiovascular;  Laterality: N/A;   TEE WITHOUT CARDIOVERSION N/A 08/18/2021   Procedure: TRANSESOPHAGEAL ECHOCARDIOGRAM (TEE);  Surgeon: Kathleene Hazel, MD;  Location: [facility] ENDOSCOPY;  Service: Cardiovascular;  Laterality: N/A;   TOTAL KNEE ARTHROPLASTY Left 04/30/2022   Procedure: LEFT TOTAL KNEE ARTHROPLASTY;  Surgeon: [facility], MD;  Location: [facility] ORS;  Service: Orthopedics;  Laterality: Left;   TOTAL KNEE ARTHROPLASTY Right 06/18/2022   Procedure: RIGHT TOTAL KNEE ARTHROPLASTY;  Surgeon: Gean Birchwood, MD;  Location: [facility] ORS;  Service: Orthopedics;  Laterality: Right;   UMBILICAL HERNIA REPAIR  child    Allergies  Allergies  Allergen Reactions   Amlodipine Besylate Swelling and Other (See Comments)    Leg swelling with 10 mg daily am dosing; decreased with BID 5 mg dosing   Aspirin Itching    EKGs/Labs/Other Studies Reviewed:   The following studies were reviewed today: Cardiac Cath 05/29/2021 reviewed as below:   Conclusion     Prox RCA lesion is 20% stenosed. Prox Cx to Mid Cx lesion is 20% stenosed. Mid LAD lesion is 30% stenosed.   Mild non-obstructive CAD Normal right and left heart pressures   Recommendations: Medical management of mild CAD. Will resume IV heparin 6 hours post sheath pull given acute PE. Would transition to Eliquis or Xarelto tomorrow.     Echo 05/26/21 IMPRESSIONS     1. Left ventricular ejection fraction, by estimation, is 45 to 50%. The  left ventricle has mildly decreased function. The left ventricle has no  regional wall motion abnormalities. There is severe asymmetric left  ventricular hypertrophy. Left ventricular   diastolic parameters are consistent with Grade I diastolic dysfunction  (impaired relaxation).   2. Right ventricular systolic function is moderately  reduced. The right  ventricular size is moderately enlarged. There is moderately elevated  pulmonary artery systolic pressure.   3. Left atrial size was moderately dilated.   4. Right atrial size was mildly dilated.   5. The mitral valve is normal in structure. Mild mitral valve  regurgitation.   6. The aortic valve is normal in structure. Aortic valve regurgitation is  not visualized. No aortic stenosis is present.   7. Aortic dilatation noted. There is mild dilatation of the ascending  aorta, measuring 41 mm.     CT 06/07/21 IMPRESSION: 1. Bland appearing, bandlike scarring and or partial atelectasis of the dependent lungs, unchanged compared to prior examination. No evidence of fibrotic interstitial lung disease. No significant air trapping on expiratory phase imaging. 2. Cardiomegaly and coronary artery disease. 3. Unchanged enlargement of the tubular ascending thoracic aorta, measuring up to 4.4 x 4.4 cm. Recommend annual imaging followup by CTA or MRA if not otherwise imaged. This recommendation follows 2010 ACCF/AHA/AATS/ACR/ASA/SCA/SCAI/SIR/STS/SVM Guidelines for the Diagnosis and Management of Patients with Thoracic Aortic Disease. Circulation. 2010; 121: H852-D782. Aortic aneurysm NOS (ICD10-I71.9)   Aortic Atherosclerosis (ICD10-I70.0).     Electronically Signed   By: Eddie Candle M.D.   On: 06/07/2021 15:12   cMRI 07/2021 IMPRESSION: 1. Asymmetric LV hypertrophy measuring 42mm in basal septum (54mm in basal lateral wall). This meets criteria for hypertrophic cardiomyopathy   2. Patchy late gadolinium enhancement at RV insertion site and basal lateral wall. LGE accounts for 1% of total myocardial mass. While this LGE pattern is consistent with HCM, would also consider Fabry disease as it can present with basal lateral LGE and LVH. Would recommend checking alpha-galactosidase A level   3. Normal LV size and systolic function (EF 42%), though was difficult to  quantify volumes/EF due to significant motion artifact during acquisition   4.  Normal RV size with mild systolic dysfunction (EF 35%)   5.  Small pericardial effusion   6.  Dilated ascending aorta measuring 75mm  EKG:  EKG is not ordered today.   Recent Labs: 06/21/2022: B Natriuretic Peptide 77.2 10/01/2022: ALT 24; Hemoglobin 13.6; Magnesium 1.9; Platelets 256; TSH 0.922 01/09/2023: BUN 18; Creatinine, Ser 1.14; Potassium 4.4; Sodium 140  Recent Lipid Panel    Component Value Date/Time   CHOL 116 10/01/2022 1055   TRIG 88 10/01/2022 1055   HDL 39 (L) 10/01/2022 1055   CHOLHDL 3.0 10/01/2022 1055   CHOLHDL 2.5 05/27/2021 0138   VLDL 13 05/27/2021 0138   LDLCALC 60 10/01/2022 1055     Home Medications   Current Meds  Medication Sig   Abatacept (ORENCIA CLICKJECT) 361 MG/ML SOAJ Inject 125 mg into the skin every Sunday.   acetaminophen-codeine (TYLENOL #3) 300-30 MG tablet Take 1 tablet by mouth 2 (two) times daily as needed for moderate pain or severe pain.   albuterol (VENTOLIN HFA) 108 (90 Base) MCG/ACT inhaler Inhale 2 puffs into the lungs every 4 (four) hours as needed for wheezing or shortness of breath.   allopurinol (ZYLOPRIM) 100 MG tablet Take 100 mg by mouth daily.   apixaban (ELIQUIS) 5 MG TABS tablet Take 1 tablet (5 mg total) by mouth 2 (two) times daily.   atorvastatin (LIPITOR) 40 MG tablet Take 1 tablet (40 mg total) by mouth daily.   Cholecalciferol (VITAMIN D3) 50 MCG (2000 UT) capsule Take 1 capsule (2,000 Units total) by mouth daily.   colchicine 0.6 MG tablet Take 2 tablets by mouth daily.   ferrous sulfate 325 (65 FE) MG tablet Take 325 mg by mouth daily.   fluticasone (FLONASE) 50 MCG/ACT nasal spray Place 1 spray into both nostrils daily. (Patient taking differently: Place 1 spray into both nostrils daily as needed for allergies.)   fluticasone-salmeterol (ADVAIR) 100-50 MCG/ACT AEPB Inhale 1 puff into the lungs 2 (two) times daily.   furosemide  (LASIX) 40 MG tablet TAKE ONE TABLET BY MOUTH DAILY FOR THREE DAYS **THEN TAKE AS NEEDED FOR INCREASE WEIGHT GAIN** (Patient taking differently: Take  40 mg by mouth daily as needed for fluid or edema (weight gain).)   gabapentin (NEURONTIN) 600 MG tablet Take 600 mg by mouth 3 (three) times daily.   hydrALAZINE (APRESOLINE) 100 MG tablet Take 1 tablet (100 mg total) by mouth 3 (three) times daily.   loratadine (CLARITIN) 10 MG tablet Take 10 mg by mouth daily.   Magnesium Oxide 400 MG CAPS Take 1 capsule (400 mg total) by mouth every other day. While taking daily multivitamin with 100mg  magnesium every day   montelukast (SINGULAIR) 10 MG tablet Take 1 tablet (10 mg total) by mouth at bedtime.   Multiple Vitamin (MULTIVITAMIN WITH MINERALS) TABS tablet Take 1 tablet by mouth daily.   potassium chloride SA (KLOR-CON M) 20 MEQ tablet Take 1 tablet by  mouth only when you have to use a Lasix (Patient taking differently: Take 20 mEq by mouth daily as needed (with each dose of Lasix/furosemide).)   predniSONE (DELTASONE) 10 MG tablet Take 4 tablets (40 mg total) by mouth daily with breakfast for 3 days, THEN 3 tablets (30 mg total) daily with breakfast for 3 days, THEN 2 tablets (20 mg total) daily with breakfast for 3 days, THEN 1 tablet (10 mg total) daily with breakfast for 3 days.   predniSONE (DELTASONE) 5 MG tablet Take 5 mg by mouth daily with breakfast.   sildenafil (VIAGRA) 50 MG tablet Take 1 tablet (50 mg total) by mouth daily as needed for erectile dysfunction.   sotalol (BETAPACE) 120 MG tablet Take 1 tablet (120 mg total) by mouth every 12 (twelve) hours.   spironolactone (ALDACTONE) 25 MG tablet TAKE ONE TABLET BY MOUTH DAILY (Patient taking differently: Take 25 mg by mouth daily.)     Review of Systems      All other systems reviewed and are otherwise negative except as noted above.  Physical Exam    VS:  BP 138/86   Pulse 90   Ht 5\' 8"  (1.727 m)   Wt 245 lb (111.1 kg)   SpO2 98%    BMI 37.25 kg/m  , BMI Body mass index is 37.25 kg/m.  Wt Readings from Last 3 Encounters:  01/17/23 245 lb (111.1 kg)  01/08/23 238 lb 12.8 oz (108.3 kg)  10/02/22 234 lb (106.1 kg)     GEN: Well nourished, well developed, in no acute distress. HEENT: normal. Neck: Supple, no JVD, carotid bruits, or masses. Cardiac: RRR, no murmurs, rubs, or gallops. No clubbing, cyanosis, edema.  Radials/PT 2+ and equal bilaterally.  Respiratory:  Respirations regular and unlabored, clear to auscultation bilaterally. GI: Soft, nontender, nondistended. MS: No deformity or atrophy. Skin: Warm and dry, no rash. Neuro:  Strength and sensation are intact. Psych: Normal affect.  Assessment & Plan    PFO -not felt that transcatheter closure would benefit him  HOCM -LVEF 65-70%, grade 1DD, no significant valvular disease, dilatation of the aortic root 52mm.  -continue current medication regimen -Sildenafil prescription provided today, discussed with Dr. 10/04/22 -ordered alpha galactosidase for further workup   DOE -this has improved and he has been feeling much better  HTN -well controlled BP 138/86 today -recommended to monitor close at home -continue current medications including hydralazine 100 mg 3 times daily and sotalol 120 mg twice daily, spironolactone 25 mg daily, Lasix 40 mg as needed with supplemental potassium as needed  Chronic diastolic heart failure -asymptomatic and euvolemic -continue medications  Chronic pulmonary embolism -continue Eliquis  Aneurysm of ascending aorta without rupture -will order  an updated CTA today  -continue tight blood pressure control        Disposition: Follow up 1 year with [name], MD or APP.  Signed, Elgie Collard, PA-C 01/17/2023, 9:16 AM [place]

## 2023-01-17 ENCOUNTER — Encounter: Payer: Self-pay | Admitting: Physician Assistant

## 2023-01-17 ENCOUNTER — Ambulatory Visit: Payer: No Typology Code available for payment source | Attending: Cardiology | Admitting: Physician Assistant

## 2023-01-17 ENCOUNTER — Other Ambulatory Visit (HOSPITAL_COMMUNITY): Payer: Self-pay

## 2023-01-17 VITALS — BP 138/86 | HR 90 | Ht 68.0 in | Wt 245.0 lb

## 2023-01-17 DIAGNOSIS — I10 Essential (primary) hypertension: Secondary | ICD-10-CM

## 2023-01-17 DIAGNOSIS — R0609 Other forms of dyspnea: Secondary | ICD-10-CM | POA: Diagnosis not present

## 2023-01-17 DIAGNOSIS — I472 Ventricular tachycardia, unspecified: Secondary | ICD-10-CM

## 2023-01-17 DIAGNOSIS — Q2112 Patent foramen ovale: Secondary | ICD-10-CM | POA: Diagnosis not present

## 2023-01-17 DIAGNOSIS — I421 Obstructive hypertrophic cardiomyopathy: Secondary | ICD-10-CM

## 2023-01-17 DIAGNOSIS — I7121 Aneurysm of the ascending aorta, without rupture: Secondary | ICD-10-CM

## 2023-01-17 DIAGNOSIS — I5032 Chronic diastolic (congestive) heart failure: Secondary | ICD-10-CM

## 2023-01-17 MED ORDER — SILDENAFIL CITRATE 50 MG PO TABS: 50.0000 mg | ORAL_TABLET | Freq: Every day | ORAL | 0 refills | Status: DC | PRN

## 2023-01-17 MED ORDER — SILDENAFIL CITRATE 50 MG PO TABS
50.0000 mg | ORAL_TABLET | Freq: Every day | ORAL | 0 refills | Status: DC | PRN
  Filled 2023-01-17: qty 10, 10d supply, fill #0

## 2023-01-17 NOTE — Patient Instructions (Addendum)
Medication Instructions:  1.Start viagra 50 mg as needed *If you need a refill on your cardiac medications before your next appointment, please call your pharmacy*   Lab Work: None ordered If you have labs (blood work) drawn today and your tests are completely normal, you will receive your results only by: [place] (if you have MyChart) OR A paper copy in the mail If you have any lab test that is abnormal or we need to change your treatment, we will call you to review the results.   Testing/Procedures: Your provider has recommended that you have a CT of the chest to look at the aorta.   Follow-Up: At [facility], you and your health needs are our priority.  As part of our continuing mission to provide you with exceptional heart care, we have created designated Provider Care Teams.  These Care Teams include your primary Cardiologist (physician) and Advanced Practice Providers (APPs -  Physician Assistants and Nurse Practitioners) who all work together to provide you with the care you need, when you need it.   Your next appointment:   1 year(s)  Provider:   [name], MD   Other Instructions Keep track of your heart rate at home and call and let us know if it remains in the 90's.

## 2023-01-19 ENCOUNTER — Other Ambulatory Visit: Payer: Self-pay | Admitting: Cardiology

## 2023-01-22 ENCOUNTER — Ambulatory Visit: Payer: No Typology Code available for payment source | Admitting: Cardiology

## 2023-01-22 ENCOUNTER — Other Ambulatory Visit: Payer: Self-pay

## 2023-01-22 MED ORDER — HYDRALAZINE HCL 100 MG PO TABS: 100.0000 mg | ORAL_TABLET | Freq: Three times a day (TID) | ORAL | 3 refills | Status: DC

## 2023-01-31 ENCOUNTER — Ambulatory Visit (HOSPITAL_COMMUNITY): Payer: No Typology Code available for payment source

## 2023-02-09 ENCOUNTER — Other Ambulatory Visit: Payer: Self-pay | Admitting: Student

## 2023-02-11 ENCOUNTER — Other Ambulatory Visit: Payer: Self-pay

## 2023-02-11 MED ORDER — SOTALOL HCL 120 MG PO TABS: 120.0000 mg | ORAL_TABLET | Freq: Two times a day (BID) | ORAL | 0 refills | Status: DC

## 2023-02-19 NOTE — Telephone Encounter (Signed)
See office visit note 01/10/23

## 2023-02-20 ENCOUNTER — Other Ambulatory Visit: Payer: Self-pay | Admitting: Physician Assistant

## 2023-02-20 ENCOUNTER — Other Ambulatory Visit (HOSPITAL_COMMUNITY): Payer: Self-pay

## 2023-02-20 MED ORDER — SILDENAFIL CITRATE 50 MG PO TABS
50.0000 mg | ORAL_TABLET | Freq: Every day | ORAL | 0 refills | Status: DC | PRN
  Filled 2023-02-20: qty 8, 8d supply, fill #0

## 2023-02-21 ENCOUNTER — Ambulatory Visit (HOSPITAL_COMMUNITY)
Admission: RE | Admit: 2023-02-21 | Discharge: 2023-02-21 | Disposition: A | Discharge status: Home / Self Care | Payer: No Typology Code available for payment source | Source: Ambulatory Visit | Attending: Physician Assistant | Admitting: Physician Assistant

## 2023-02-21 ENCOUNTER — Other Ambulatory Visit (HOSPITAL_COMMUNITY): Payer: Self-pay

## 2023-02-21 DIAGNOSIS — I7121 Aneurysm of the ascending aorta, without rupture: Secondary | ICD-10-CM | POA: Diagnosis present

## 2023-02-21 MED ORDER — IOHEXOL 350 MG/ML SOLN
100.0000 mL | Freq: Once | INTRAVENOUS | Status: AC | PRN
  Administered 2023-02-21: 100 mL via INTRAVENOUS

## 2023-02-22 ENCOUNTER — Other Ambulatory Visit: Payer: Self-pay | Admitting: Student

## 2023-02-22 ENCOUNTER — Telehealth: Payer: Self-pay

## 2023-02-22 NOTE — Telephone Encounter (Signed)
Ryan Glass, [place]  You21 minutes ago (4:58 PM)    [name],  Your aorta was measuring 4.4 cm last year so no, it has not gotten any larger thankfully.  The best thing you can do to prevent it from getting larger is blood pressure control.  Please take your blood pressure daily an hour after morning medications.  While this blood pressure and bring it to next appointment.  You should be aiming for 120/80 mmHg  Hope this helps  Ryan Collard, PA-C

## 2023-02-22 NOTE — Telephone Encounter (Signed)
Patient has been notified directly; all questions, if any, were answered. Patient voiced understanding.    

## 2023-02-22 NOTE — Telephone Encounter (Signed)
Contacted the patient to discuss test results.   He had a few questions:   He wants to know if there is anything he can do about his enlarged Aorta?   Has it grew in size?   Some labs were discussed at his last office visit and he wants to know when he needs to complete them?  Advised the patient that I would send his questions to [facility] and get back to him once I hear back from her. He voiced understanding.

## 2023-02-25 ENCOUNTER — Ambulatory Visit (INDEPENDENT_AMBULATORY_CARE_PROVIDER_SITE_OTHER): Payer: No Typology Code available for payment source

## 2023-02-25 DIAGNOSIS — I472 Ventricular tachycardia, unspecified: Secondary | ICD-10-CM

## 2023-02-27 ENCOUNTER — Telehealth: Payer: Self-pay | Admitting: Registered Nurse

## 2023-02-27 ENCOUNTER — Ambulatory Visit: Payer: No Typology Code available for payment source | Admitting: Occupational Medicine

## 2023-02-27 ENCOUNTER — Encounter: Payer: Self-pay | Admitting: Registered Nurse

## 2023-02-27 VITALS — BP 140/90

## 2023-02-27 DIAGNOSIS — J019 Acute sinusitis, unspecified: Secondary | ICD-10-CM

## 2023-02-27 DIAGNOSIS — J301 Allergic rhinitis due to pollen: Secondary | ICD-10-CM

## 2023-02-27 DIAGNOSIS — R0981 Nasal congestion: Secondary | ICD-10-CM

## 2023-02-27 LAB — CUP PACEART REMOTE DEVICE CHECK
Battery Remaining Longevity: 65 mo
Battery Remaining Percentage: 76 %
Battery Voltage: 2.98 V
Brady Statistic AP VP Percent: 50 %
Brady Statistic AP VS Percent: 48 %
Brady Statistic AS VP Percent: 1 %
Brady Statistic AS VS Percent: 1.7 %
Brady Statistic RA Percent Paced: 96 %
Brady Statistic RV Percent Paced: 50 %
Date Time Interrogation Session: 20240320095515
HighPow Impedance: 70 Ohm
Implantable Lead Connection Status: 753985
Implantable Lead Connection Status: 753985
Implantable Lead Implant Date: 20220829
Implantable Lead Implant Date: 20220829
Implantable Lead Location: 753859
Implantable Lead Location: 753860
Implantable Pulse Generator Implant Date: 20220829
Lead Channel Impedance Value: 380 Ohm
Lead Channel Impedance Value: 440 Ohm
Lead Channel Pacing Threshold Amplitude: 0.75 V
Lead Channel Pacing Threshold Amplitude: 0.75 V
Lead Channel Pacing Threshold Pulse Width: 0.5 ms
Lead Channel Pacing Threshold Pulse Width: 0.5 ms
Lead Channel Sensing Intrinsic Amplitude: 12 mV
Lead Channel Sensing Intrinsic Amplitude: 3.8 mV
Lead Channel Setting Pacing Amplitude: 2 V
Lead Channel Setting Pacing Amplitude: 2.5 V
Lead Channel Setting Pacing Pulse Width: 0.5 ms
Lead Channel Setting Sensing Sensitivity: 0.5 mV
Pulse Gen Serial Number: 810029878

## 2023-02-27 MED ORDER — SALINE SPRAY 0.65 % NA SOLN: 2.0000 | NASAL | 0 refills | Status: DC

## 2023-02-27 MED ORDER — AZELASTINE HCL 0.1 % NA SOLN
1.0000 | Freq: Two times a day (BID) | NASAL | 12 refills | Status: AC
  Filled 2023-07-24: qty 30, 30d supply, fill #0
  Filled 2023-08-15: qty 30, 30d supply, fill #1
  Filled 2023-09-08 – 2024-02-10 (×3): qty 30, 30d supply, fill #2

## 2023-02-27 MED ORDER — PHENYLEPHRINE HCL 10 MG PO TABS: 10.0000 mg | ORAL_TABLET | Freq: Four times a day (QID) | ORAL | Status: AC | PRN

## 2023-02-27 NOTE — Progress Notes (Signed)
Patient requesting help sending data from Heart monitor. Also requesting something else for allergy sinus. NP notified due to current meds and medical history. Np ordering clartin nose spray. Patient educated to stop oral clartin. Continue saline spray Flonase. Able to use refresh eye drops for eyes. If doesn't help or gets worse to contact clinic. Patient to take Ryan Glass educated not to take sudafate.

## 2023-02-27 NOTE — Telephone Encounter (Signed)
Loratadine not working for patient seasonal allergies with singulair, nasal saline and flonase.  Will trial azelastine 0.1% nasal spray 1 spray each nostril BID electronic Rx sent to his pharmacy of choice.  Continue OTC eye drops per manufacturer instructions for red/itchy eyes.  May add phenylephrine 10mg  po QID prn rhintiis.  Avoid sleeping with windows open shower prior to going to bed.  Patent verbalized understanding information/instructions and had no further questions at this time.  Due to international travel within the past week consider covid test also.  RN [employer] notified.

## 2023-02-27 NOTE — Progress Notes (Signed)
Checked Covid test due to recently being out of the country. Covid negative. New meds sprinolactone 3/18 patient didn't know about. Told to call [name] office to ask and schedule a follow up appt. Given a BP log sheet to monitor BP.

## 2023-03-11 ENCOUNTER — Other Ambulatory Visit: Payer: Self-pay | Admitting: Pulmonary Disease

## 2023-03-16 ENCOUNTER — Other Ambulatory Visit: Payer: Self-pay | Admitting: Pulmonary Disease

## 2023-03-18 ENCOUNTER — Other Ambulatory Visit: Payer: Self-pay | Admitting: *Deleted

## 2023-03-18 ENCOUNTER — Ambulatory Visit: Payer: No Typology Code available for payment source | Admitting: Occupational Medicine

## 2023-03-18 ENCOUNTER — Telehealth: Payer: Self-pay | Admitting: Registered Nurse

## 2023-03-18 ENCOUNTER — Encounter: Payer: Self-pay | Admitting: Registered Nurse

## 2023-03-18 DIAGNOSIS — E7849 Other hyperlipidemia: Secondary | ICD-10-CM

## 2023-03-18 DIAGNOSIS — J441 Chronic obstructive pulmonary disease with (acute) exacerbation: Secondary | ICD-10-CM

## 2023-03-18 DIAGNOSIS — R002 Palpitations: Secondary | ICD-10-CM

## 2023-03-18 DIAGNOSIS — J4531 Mild persistent asthma with (acute) exacerbation: Secondary | ICD-10-CM

## 2023-03-18 MED ORDER — PREDNISONE 10 MG PO TABS: 20.0000 mg | ORAL_TABLET | Freq: Every day | ORAL | 0 refills | Status: AC

## 2023-03-18 MED ORDER — ALBUTEROL SULFATE HFA 108 (90 BASE) MCG/ACT IN AERS: 2.0000 | INHALATION_SPRAY | RESPIRATORY_TRACT | 1 refills | Status: DC | PRN

## 2023-03-18 MED ORDER — SOTALOL HCL 120 MG PO TABS: 120.0000 mg | ORAL_TABLET | Freq: Two times a day (BID) | ORAL | 0 refills | Status: DC

## 2023-03-18 NOTE — Telephone Encounter (Unsigned)
Contacted by RN Bess Kinds patient in clinic with dypsnea on exertion and palpitations.  Took lasix yesterday weight today 238lbs typically 232lbs per patient.  Did not take lasix today recommended patient to take lasix today along with potassium.  Sp02 93% RA but shortness of breath with walking across warehouse to clinic.  Patient speaking in short phrases in clinic.  Breath sounds fine wheeze per RN Kimrey.  Discussed take 20mg  po prednisone today and tomorrow am.  Albuterol inhaler has run out refill sent to his pharmacy of choice electronically today also.  BMET today as history hypokalemia with lasix worsening palpitations in the past.  Follow up with cardiology if new or worsening chest symptoms.  Acute rhinitis taking nasal saline, singulair and claritin.  Covid test today also home.  Patient A&Ox3 agreed with plan of care and had no further questions at this time.

## 2023-03-18 NOTE — Progress Notes (Signed)
At 945 am Pt reports he is still having watery eye and nose. Repeated Covid test negative. Came in because he is having difficulty breathing. Noted patient very labored breathing lung sound diminished throughout. O2 sat is 935 which he reports is good. Hr is up 108 which is high for him. He reports heart beat is skipping. NP made aware. His weight is 235 lb this am. Patient reports taking lasix Sat and Sun but not today. Np recommended taking extra prednisone 2 tabs today and his lasix. She also requested a met b lab.   At 1035 am Lab drawn from Left AC tolerated well no issues noted.

## 2023-03-19 ENCOUNTER — Ambulatory Visit: Payer: No Typology Code available for payment source | Admitting: Registered Nurse

## 2023-03-19 ENCOUNTER — Encounter: Payer: Self-pay | Admitting: Registered Nurse

## 2023-03-19 VITALS — BP 138/94 | HR 90 | Temp 97.0°F | Resp 22

## 2023-03-19 DIAGNOSIS — J4531 Mild persistent asthma with (acute) exacerbation: Secondary | ICD-10-CM

## 2023-03-19 DIAGNOSIS — E7849 Other hyperlipidemia: Secondary | ICD-10-CM

## 2023-03-19 DIAGNOSIS — M0609 Rheumatoid arthritis without rheumatoid factor, multiple sites: Secondary | ICD-10-CM

## 2023-03-19 DIAGNOSIS — J301 Allergic rhinitis due to pollen: Secondary | ICD-10-CM

## 2023-03-19 DIAGNOSIS — R0609 Other forms of dyspnea: Secondary | ICD-10-CM

## 2023-03-19 LAB — BASIC METABOLIC PANEL
BUN/Creatinine Ratio: 18 (ref 10–24)
BUN: 22 mg/dL (ref 8–27)
CO2: 16 mmol/L — ABNORMAL LOW (ref 20–29)
Calcium: 9.7 mg/dL (ref 8.6–10.2)
Chloride: 106 mmol/L (ref 96–106)
Creatinine, Ser: 1.24 mg/dL (ref 0.76–1.27)
Glucose: 144 mg/dL — ABNORMAL HIGH (ref 70–99)
Potassium: 3.6 mmol/L (ref 3.5–5.2)
Sodium: 142 mmol/L (ref 134–144)
eGFR: 64 mL/min/{1.73_m2} (ref >59)

## 2023-03-19 MED ORDER — PREDNISONE 10 MG PO TABS: 5.0000 mg | ORAL_TABLET | Freq: Every day | ORAL | 0 refills | Status: AC

## 2023-03-19 MED ORDER — ATORVASTATIN CALCIUM 40 MG PO TABS
40.0000 mg | ORAL_TABLET | Freq: Every day | ORAL | 1 refills | Status: DC
Start: 2023-03-19 — End: 2023-09-18

## 2023-03-19 MED ORDER — POTASSIUM CHLORIDE CRYS ER 20 MEQ PO TBCR: 20.0000 meq | EXTENDED_RELEASE_TABLET | Freq: Every day | ORAL | 1 refills | Status: DC | PRN

## 2023-03-19 NOTE — Patient Instructions (Addendum)
Restart advair diskus inhaler Asthma Triggers and Exacerbation In this video, you will learn about exposures and other factors that can lead to asthma attacks and trouble controlling asthma. To view the content, go to this web address: [email or web address]  This video will expire on: 11/21/2024. If you need access to this video following this date, please reach out to the healthcare provider who assigned it to you. This information is not intended to replace advice given to you by your health care provider. Make sure you discuss any questions you have with your health care provider. Elsevier Patient Education  2023 [employer].

## 2023-03-19 NOTE — Progress Notes (Unsigned)
Cardiology Office Note Date:  03/19/2023  Patient ID:  Ryan Glass May 31, 1956, MRN [id number] PCP:  [name], MD  Cardiologist:  *** Electrophysiologist: ***  ***refresh   Chief Complaint: ***  History of Present Illness: Ryan Glass is a 67 y.o. male with history of ***   Device information ***   Past Medical History:  Diagnosis Date   AICD (automatic cardioverter/defibrillator) present    Ascending aortic aneurysm    a. CT 05/2021 showed 4.4x4.4cm TAA. Stable on chest CTA 12/2021 at 4.4cm.   Atrial fibrillation    Bilateral lower extremity edema    Celiac artery dilatation    1.8cm at the celiac trunk on Chest CT 12/2021   CHF (congestive heart failure)    Chronic gout without tophus    followed by dr Sharmon Revere    (06-08-2020  per pt last episode right knee 3 wks ago)   CKD (chronic kidney disease), stage II    nephrology--- Virgina Norfolk PA (10-29-2019 note in epic scanned in  media)   Coronary artery disease    Heart failure with mid-range ejection fraction 05/27/2021   05/26/21: Left ventricular ejection fraction, by estimation, is 45 to 50%. There is severe asymmetric left ventricular hypertrophy. G1DD present.    History of COVID-19 06/07/2021   HOCM (hypertrophic obstructive cardiomyopathy)    s/p ICD 07/2021   Hypertension    followed by cardiology, dr t. turner   (05-14-2018 nuclear study in epic , normal perfusion with nuclear ef 61%)   Left hydrocele    Low serum potassium level 04/27/2014   OSA on CPAP    per pt uses every night   Palpitations followed by dr t. Mayford Knife   06-08-2020  still feels palipations due to PVCs when exertion but not with chest pain/ discomfort   PVC's (premature ventricular contractions) cardiologist--- dr t. turner   Status post PVC ablation by Dr. Ladona Ridgel 2019 with recurrence of frequent PVCs/bigeminy;  prior pseudobradycardia r/t pvcs   Rheumatoid arthritis involving multiple sites    rheumotology--- dr a.  Sharmon Revere  ([facility] in [place])    Past Surgical History:  Procedure Laterality Date   BIOPSY  10/08/2021   Procedure: BIOPSY;  Surgeon: Jenel Lucks, MD;  Location: [facility] ENDOSCOPY;  Service: Gastroenterology;;   Thressa Sheller STUDY  08/18/2021   Procedure: BUBBLE STUDY;  Surgeon: Wendall Stade, MD;  Location: [facility] ENDOSCOPY;  Service: Cardiovascular;;   ESOPHAGOGASTRODUODENOSCOPY N/A 10/08/2021   Procedure: ESOPHAGOGASTRODUODENOSCOPY (EGD);  Surgeon: Jenel Lucks, MD;  Location: [facility] ENDOSCOPY;  Service: Gastroenterology;  Laterality: N/A;   HYDROCELE EXCISION Left 06/14/2020   Procedure: LEFT  HYDROCELECTOMY ADULT;  Surgeon: Noel Christmas, MD;  Location: [facility];  Service: Urology;  Laterality: Left;   ICD IMPLANT N/A 08/07/2021   Procedure: ICD IMPLANT;  Surgeon: Marinus Maw, MD;  Location: [facility] INVASIVE CV LAB;  Service: Cardiovascular;  Laterality: N/A;   INCISIONAL HERNIA REPAIR  02-23-2016      LAPAROSCOPIC   LAPAROSCOPIC INGUINAL HERNIA REPAIR Bilateral 08-22-2015     AND UMBILICAL HERNIA REPAIR   PVC ABLATION N/A 10/07/2018   Procedure: PVC ABLATION;  Surgeon: Marinus Maw, MD;  Location: [facility] INVASIVE CV LAB;  Service: Cardiovascular;  Laterality: N/A;   RIGHT/LEFT HEART CATH AND CORONARY ANGIOGRAPHY N/A 05/29/2021   Procedure: RIGHT/LEFT HEART CATH AND CORONARY ANGIOGRAPHY;  Surgeon: Kathleene Hazel, MD;  Location: [facility] INVASIVE CV LAB;  Service: Cardiovascular;  Laterality: N/A;   TEE WITHOUT CARDIOVERSION N/A 08/18/2021   Procedure: TRANSESOPHAGEAL ECHOCARDIOGRAM (TEE);  Surgeon: Wendall StadeNishan, Peter C, MD;  [facility] END[facility]COPY;  Service: Cardiovascular;  Laterality: N/A;   TOTAL KNEE ARTHROPLASTY Left 04/30/2022   Procedure: LEFT TOTAL KNEE ARTHROPLASTY;  Surgeon: Gean Birchwoodowan, Frank, MD;  Location: [facility] ORS;  Service: Orthopedics;  Laterality: Left;   TOTAL KNEE ARTHROPLASTY Right 06/18/2022   Procedure: RIGHT TOTAL KNEE ARTHROPLASTY;  Surgeon: Gean Birchwoodowan, Frank,  MD;  Location: [facility] ORS;  Service: Orthopedics;  Laterality: Right;   UMBILICAL HERNIA REPAIR  child    Current Outpatient Medications  Medication Sig Dispense Refill   Abatacept (ORENCIA CLICKJECT) 125 MG/ML SOAJ Inject 125 mg into the skin every Sunday.     acetaminophen-codeine (TYLENOL #3) 300-30 MG tablet Take 1 tablet by mouth 2 (two) times daily as needed for moderate pain or severe pain.     albuterol (VENTOLIN HFA) 108 (90 Base) MCG/ACT inhaler Inhale 2 puffs into the lungs every 4 (four) hours as needed for wheezing or shortness of breath. 8 g 1   allopurinol (ZYLOPRIM) 100 MG tablet Take 100 mg by mouth daily.     apixaban (ELIQUIS) 5 MG TABS tablet Take 1 tablet (5 mg total) by mouth 2 (two) times daily. 90 tablet 1   atorvastatin (LIPITOR) 40 MG tablet Take 1 tablet (40 mg total) by mouth daily. 90 tablet 1   azelastine (ASTELIN) 0.1 % nasal spray Place 1 spray into both nostrils 2 (two) times daily. Use in each nostril as directed stop pill claritin/zyrtec/loratadine/cetirizine 30 mL 12   Cholecalciferol (VITAMIN D3) 50 MCG (2000 UT) capsule Take 1 capsule (2,000 Units total) by mouth daily.     colchicine 0.6 MG tablet Take 2 tablets by mouth daily.     ferrous sulfate 325 (65 FE) MG tablet Take 325 mg by mouth daily.     fluticasone (FLONASE) 50 MCG/ACT nasal spray Place 1 spray into both nostrils daily. (Patient taking differently: Place 1 spray into both nostrils daily as needed for allergies.)     fluticasone-salmeterol (ADVAIR) 100-50 MCG/ACT AEPB Inhale 1 puff into the lungs 2 (two) times daily.     furosemide (LASIX) 40 MG tablet TAKE ONE TABLET BY MOUTH DAILY FOR THREE DAYS **THEN TAKE AS NEEDED FOR INCREASE WEIGHT GAIN** (Patient taking differently: Take 40 mg by mouth daily as needed for fluid or edema (weight gain).) 90 tablet 3   gabapentin (NEURONTIN) 600 MG tablet Take 600 mg by mouth 3 (three) times daily.     hydrALAZINE (APRESOLINE) 100 MG tablet Take 1 tablet (100  mg total) by mouth 3 (three) times daily. 270 tablet 3   Magnesium Oxide 400 MG CAPS Take 1 capsule (400 mg total) by mouth every other day. While taking daily multivitamin with 100mg  magnesium every day 90 capsule 2   montelukast (SINGULAIR) 10 MG tablet TAKE 1 TABLET BY MOUTH AT BEDTIME 30 tablet 3   Multiple Vitamin (MULTIVITAMIN WITH MINERALS) TABS tablet Take 1 tablet by mouth daily.     potassium chloride SA (KLOR-CON M) 20 MEQ tablet Take 1 tablet by  mouth only when you have to use a Lasix (Patient taking differently: Take 20 mEq by mouth daily as needed (with each dose of Lasix/furosemide).) 90 tablet 3   potassium chloride SA (KLOR-CON M) 20 MEQ tablet Take 1 tablet (20 mEq total) by mouth daily as needed. Take 1 tab daily when taking lasix/furosemide 40mg  90 tablet 1   predniSONE (  DELTASONE) 10 MG tablet Take 2 tablets (20 mg total) by mouth daily with breakfast for 4 days. 8 tablet 0   predniSONE (DELTASONE) 10 MG tablet Take 0.5 tablets (5 mg total) by mouth daily with breakfast. 42 tablet 0   predniSONE (DELTASONE) 5 MG tablet Take 5 mg by mouth daily with breakfast.     sildenafil (VIAGRA) 50 MG tablet Take 1 tablet (50 mg total) by mouth daily as needed for erectile dysfunction. 10 tablet 0   sildenafil (VIAGRA) 50 MG tablet Take 1 tablet (50 mg total) by mouth daily as needed for ED 10 tablet 0   sodium chloride (OCEAN) 0.65 % SOLN nasal spray Place 2 sprays into both nostrils every 2 (two) hours while awake.  0   sotalol (BETAPACE) 120 MG tablet Take 1 tablet (120 mg total) by mouth every 12 (twelve) hours. 30 tablet 0   spironolactone (ALDACTONE) 25 MG tablet TAKE 1 TABLET BY MOUTH ONCE DAILY 30 tablet 0   No current facility-administered medications for this visit.    Allergies:   Amlodipine besylate and Aspirin   Social History:  The patient  reports that he has never smoked. He has never used smokeless tobacco. He reports that he does not drink alcohol and does not use  drugs.   Family History:  The patient's family history includes Cardiomyopathy in his mother; Heart attack in his father.***  ROS:  Please see the history of present illness.    All other systems are reviewed and otherwise negative.   PHYSICAL EXAM:  VS:  There were no vitals taken for this visit. BMI: There is no height or weight on file to calculate BMI. Well nourished, well developed, in no acute distress HEENT: normocephalic, atraumatic Neck: no JVD, carotid bruits or masses Cardiac:  *** RRR; no significant murmurs, no rubs, or gallops Lungs:  *** CTA b/l, no wheezing, rhonchi or rales Abd: soft, nontender MS: no deformity or *** atrophy Ext: *** no edema Skin: warm and dry, no rash Neuro:  No gross deficits appreciated Psych: euthymic mood, full affect  *** PPM/ICD site is stable, no tethering or discomfort   EKG:  Done today and reviewed by myself shows  ***  Device interrogation done today and reviewed by myself:  ***  Recent Labs: 06/21/2022: B Natriuretic Peptide 77.2 10/01/2022: ALT 24; Hemoglobin 13.6; Magnesium 1.9; Platelets 256; TSH 0.922 03/18/2023: BUN 22; Creatinine, Ser 1.24; Potassium 3.6; Sodium 142  10/01/2022: Chol/HDL Ratio 3.0; Cholesterol, Total 116; HDL 39; LDL Chol Calc (NIH) 60; Triglycerides 88   Estimated Creatinine Clearance: 68.4 mL/min (by C-G formula based on SCr of 1.24 mg/dL).   Wt Readings from Last 3 Encounters:  03/18/23 235 lb (106.6 kg)  01/17/23 245 lb (111.1 kg)  01/08/23 238 lb 12.8 oz (108.3 kg)     Other studies reviewed: Additional studies/records reviewed today include: summarized above  ASSESSMENT AND PLAN:  ***  Disposition: F/u with ***  Current medicines are reviewed at length with the patient today.  The patient did not have any concerns regarding medicines.  Norma Fredrickson, PA-C 03/19/2023 6:38 PM     [facility] [address] [place] [address] [phone] (office)   [phone] (fax)

## 2023-03-19 NOTE — Telephone Encounter (Signed)
Patient seen in clinic 03/19/23 see office note

## 2023-03-19 NOTE — Progress Notes (Signed)
Subjective:    Patient ID: Ryan KindredJose S Azar, male    DOB: 09/16/1956, 67 y.o.   MRN: [id number]  67y/o married [place] established patient here for re-evaluation cough/shortness of breath with activity.  Stated feeling better since using albuterol and prednisone increased to 25mg  po daily (his rheumatoid 5mg  + 20mg  asthma exacerbation dose).   Took his dose of lasix yesterday as weight was 238lbs did not take this am stated his weight at home 235lbs this am. His normal weight/skip lasix 232lbs.  Was still able to talk full sentences after walking across warehouse today.  Denied fever/chills/home covid test was negative.  Taking his flonase nasal, saline nasal, claritin and singulair daily.  Has not been using his advair diskus.  Still having nasal congestion, watery eyes and post nasal drip but sinus pressure resolved with increased prednisone dose also.   Palpitations back to baseline.  Read his my chart regarding lab results from yesterday BMET normal.   Needs refill on potassium, atorvastatin and prednisone from [facility].  Has plenty of lasix remaining.  [facility] but it is not at this time.        Review of Systems  Constitutional:  Negative for activity change, appetite change, chills, fatigue and fever.  HENT:  Positive for congestion, postnasal drip, rhinorrhea, sinus pressure and sinus pain. Negative for dental problem, ear discharge, ear pain, hearing loss, mouth sores, nosebleeds, trouble swallowing and voice change.   Eyes:  Positive for discharge. Negative for photophobia, pain, itching and visual disturbance.  Respiratory:  Positive for cough and shortness of breath. Negative for chest tightness, wheezing and stridor.   Cardiovascular:  Positive for palpitations. Negative for chest pain.  Gastrointestinal:  Negative for diarrhea, nausea and  vomiting.  Genitourinary:  Negative for difficulty urinating.  Musculoskeletal:  Negative for gait problem.  Skin:  Negative for color change, pallor, rash and wound.  Allergic/Immunologic: Positive for environmental allergies.  Neurological:  Negative for dizziness, tremors, seizures, syncope, facial asymmetry, speech difficulty, weakness, light-headedness, numbness and headaches.  Psychiatric/Behavioral:  Negative for agitation, confusion and sleep disturbance.        Objective:   Physical Exam Vitals and nursing note reviewed.  Constitutional:      General: He is awake. He is not in acute distress.    Appearance: He is well-developed and well-groomed. He is obese. He is not ill-appearing, toxic-appearing or diaphoretic.     Interventions: He is not intubated. HENT:     Head: Normocephalic and atraumatic.     Jaw: There is normal jaw occlusion. No trismus.     Salivary Glands: Right salivary gland is not diffusely enlarged or tender. Left salivary gland is not diffusely enlarged or tender.     Comments: Bilateral lower eyelid edema nonpitting 2+/4 with allergic shiners; clear discharge bilateral nasal turbinates edema erythema; cobblestoning posterior pharynx;     Right Ear: Hearing, ear canal and external ear normal. No decreased hearing noted. No drainage, swelling or tenderness.     Left Ear: Hearing, ear canal and external ear normal. No decreased hearing noted. No drainage, swelling or tenderness.     Nose: Mucosal edema, congestion and rhinorrhea present. No nasal deformity, septal deviation or laceration. Rhinorrhea is clear.     Right Turbinates: Enlarged and swollen. Not pale.     Left Turbinates: Enlarged and swollen. Not pale.  Right Sinus: No maxillary sinus tenderness or frontal sinus tenderness.     Left Sinus: No maxillary sinus tenderness or frontal sinus tenderness.     Mouth/Throat:     Lips: Pink. No lesions.     Mouth: Mucous membranes are moist. Mucous  membranes are not pale, not dry and not cyanotic. No lacerations, oral lesions or angioedema.     Dentition: No dental abscesses or gum lesions.     Tongue: No lesions. Tongue does not deviate from midline.     Palate: No mass and lesions.     Pharynx: Uvula midline. Pharyngeal swelling and posterior oropharyngeal erythema present. No oropharyngeal exudate or uvula swelling.     Tonsils: No tonsillar exudate or tonsillar abscesses. 0 on the right. 0 on the left.  Eyes:     General: Lids are normal. Vision grossly intact. Gaze aligned appropriately. Allergic shiner present. No scleral icterus.       Right eye: No foreign body, discharge or hordeolum.        Left eye: No foreign body, discharge or hordeolum.     Extraocular Movements: Extraocular movements intact.     Right eye: Normal extraocular motion and no nystagmus.     Left eye: Normal extraocular motion and no nystagmus.     Conjunctiva/sclera: Conjunctivae normal.     Right eye: Right conjunctiva is not injected. No chemosis, exudate or hemorrhage.    Left eye: Left conjunctiva is not injected. No chemosis, exudate or hemorrhage.    Pupils: Pupils are equal, round, and reactive to light. Pupils are equal.     Right eye: Pupil is round and reactive.     Left eye: Pupil is round and reactive.  Neck:     Thyroid: No thyroid mass or thyromegaly.     Trachea: Trachea and phonation normal. No tracheal tenderness or tracheal deviation.  Cardiovascular:     Rate and Rhythm: Normal rate and regular rhythm.     Pulses:          Radial pulses are 2+ on the right side and 2+ on the left side.     Heart sounds: Normal heart sounds, S1 normal and S2 normal.  Pulmonary:     Effort: Pulmonary effort is normal. No tachypnea, bradypnea, accessory muscle usage or respiratory distress. He is not intubated.     Breath sounds: Normal breath sounds and air entry. No stridor, decreased air movement or transmitted upper airway sounds. No decreased breath  sounds, wheezing, rhonchi or rales.     Comments: Spoke full sentences without difficulty; no cough observed in exam room Abdominal:     General: There is no distension.     Palpations: Abdomen is soft.  Musculoskeletal:        General: No tenderness.     Right hand: Normal strength. Normal capillary refill.     Left hand: Normal strength. Normal capillary refill.     Cervical back: Neck supple. No edema, erythema, signs of trauma, rigidity, torticollis, tenderness or crepitus. No pain with movement, spinous process tenderness or muscular tenderness. Decreased range of motion.     Right lower leg: No tenderness. 1+ Pitting Edema present.     Left lower leg: No tenderness. 1+ Pitting Edema present.     Right ankle: Swelling present. No ecchymosis. No tenderness.     Left ankle: Swelling present. No ecchymosis. No tenderness.     Comments: Bilateral lower extremities/ankles distal 1+/4 pitting edema  Lymphadenopathy:  Head:     Right side of head: No submental, submandibular, tonsillar, preauricular, posterior auricular or occipital adenopathy.     Left side of head: No submental, submandibular, tonsillar, preauricular, posterior auricular or occipital adenopathy.     Cervical: No cervical adenopathy.     Right cervical: No superficial, deep or posterior cervical adenopathy.    Left cervical: No superficial, deep or posterior cervical adenopathy.  Skin:    General: Skin is warm and dry.     Capillary Refill: Capillary refill takes less than 2 seconds.     Coloration: Skin is not ashen, cyanotic, jaundiced, mottled, pale or sallow.     Findings: No abrasion, abscess, acne, bruising, burn, ecchymosis, erythema, signs of injury, laceration, lesion, petechiae, rash or wound.     Nails: There is no clubbing.  Neurological:     General: No focal deficit present.     Mental Status: He is alert and oriented to person, place, and time. Mental status is at baseline.     GCS: GCS eye subscore is  4. GCS verbal subscore is 5. GCS motor subscore is 6.     Cranial Nerves: Cranial nerves 2-12 are intact. No cranial nerve deficit, dysarthria or facial asymmetry.     Sensory: Sensation is intact. No sensory deficit.     Motor: Motor function is intact. No weakness, tremor, atrophy, abnormal muscle tone or seizure activity.     Coordination: Coordination is intact. Coordination normal.     Gait: Gait is intact. Gait normal.     Comments: Gait sure and steady in clinic; in/out of chair without difficulty; bilateral hand grasp equal 5/5  Psychiatric:        Attention and Perception: Attention and perception normal.        Mood and Affect: Mood and affect normal.        Speech: Speech normal.        Behavior: Behavior normal. Behavior is cooperative.        Thought Content: Thought content normal.        Cognition and Memory: Cognition and memory normal.        Judgment: Judgment normal.      Latest Reference Range & Units 03/18/23 10:35  Sodium 134 - 144 mmol/L 142  Potassium 3.5 - 5.2 mmol/L 3.6  Chloride 96 - 106 mmol/L 106  CO2 20 - 29 mmol/L 16 (L)  Glucose 70 - 99 mg/dL 403 (H)  BUN 8 - 27 mg/dL 22  Creatinine 5.24 - 8.18 mg/dL 5.90  Calcium 8.6 - 93.1 mg/dL 9.7  BUN/Creatinine Ratio 10 - 24  18  eGFR >59 mL/min/1.73 64  (L): Data is abnormally low (H): Data is abnormally high Patient notified electrolytes and renal function normal via my chart and verbally in clinic.  Patient verbalized understanding and had no further questions at that time.      Assessment & Plan:  A-mild persistent asthma with exacerbation, dyspnea on exertion, seasonal allergic rhinitis due to pollen, other hyperlipidemia, rheumatoid arthritis of multiple sites with negative RF  P-Patient did not need lasix refill at this time.  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  He supply low  on potassium, prednisone and atorvastatin.  Labs current/stable dispensed from [facility] to patient today prednisone 10mg  take 1/2 tab po daily #42, atorvastatin 40mg  po daily #90 RF2 , potassium chloride  po daily when taking lasix #90 RF1. Lungs and sp02 improved today with albuterol and prednisone use BBS CTA and DOE decreased today per patient and RN Kimrey.  Discussed should be using advair diskus every day especially during allergy season Mild pitting edema BLE 1+/4  Keep cardiology appt as scheduled tomorrow.  Rheumatology appt 04/02/23 Spoke in sentences without difficulty less shortness of breath after walking across warehouse today  Given 1 bottle nasal saline from clinic stock Continue claritin, albuterol, nasal saline, flonase and  prednisone x 4 days #21 RF0 dispensed from [facility] to patient today.  Patient stated he does not need refills on claritin or singulair at this time.  Shower prior to bed and do not sleep with windows open due to high pollen counts locally at this time.Fasting  Labs due December for lipids.  BMET yesterday WNL nonfasting.  Patient to contact [facility] and [facility] and patient to contact [facility] to notify pharmacy staff [facility] Rx at [facility] to start initiation of transfer Rx to new pharmacy from his other providers.  Follow up for re-evaluation if worsening DOE/SOB/wheezing or productive cough.  Patient verbalized understanding information/instructions, agreed with plan of care and had no further questions at this time.

## 2023-03-20 ENCOUNTER — Encounter: Payer: Self-pay | Admitting: Physician Assistant

## 2023-03-20 ENCOUNTER — Ambulatory Visit: Payer: No Typology Code available for payment source | Attending: Physician Assistant | Admitting: Physician Assistant

## 2023-03-20 VITALS — BP 140/98 | HR 89 | Ht 68.0 in | Wt 239.0 lb

## 2023-03-20 DIAGNOSIS — Z79899 Other long term (current) drug therapy: Secondary | ICD-10-CM

## 2023-03-20 DIAGNOSIS — I472 Ventricular tachycardia, unspecified: Secondary | ICD-10-CM | POA: Diagnosis not present

## 2023-03-20 DIAGNOSIS — Z95810 Presence of automatic (implantable) cardiac defibrillator: Secondary | ICD-10-CM | POA: Diagnosis not present

## 2023-03-20 DIAGNOSIS — I480 Paroxysmal atrial fibrillation: Secondary | ICD-10-CM

## 2023-03-20 DIAGNOSIS — I10 Essential (primary) hypertension: Secondary | ICD-10-CM

## 2023-03-20 DIAGNOSIS — Z5181 Encounter for therapeutic drug level monitoring: Secondary | ICD-10-CM

## 2023-03-20 DIAGNOSIS — I422 Other hypertrophic cardiomyopathy: Secondary | ICD-10-CM

## 2023-03-20 LAB — CUP PACEART INCLINIC DEVICE CHECK
Battery Remaining Longevity: 64 mo
Brady Statistic RA Percent Paced: 95 %
Brady Statistic RV Percent Paced: 49 %
Date Time Interrogation Session: 20240410174408
HighPow Impedance: 65.25 Ohm
Implantable Lead Connection Status: 753985
Implantable Lead Connection Status: 753985
Implantable Lead Implant Date: 20220829
Implantable Lead Implant Date: 20220829
Implantable Lead Location: 753859
Implantable Lead Location: 753860
Implantable Pulse Generator Implant Date: 20220829
Lead Channel Impedance Value: 362.5 Ohm
Lead Channel Impedance Value: 437.5 Ohm
Lead Channel Pacing Threshold Amplitude: 0.75 V
Lead Channel Pacing Threshold Amplitude: 0.75 V
Lead Channel Pacing Threshold Amplitude: 1.25 V
Lead Channel Pacing Threshold Amplitude: 1.25 V
Lead Channel Pacing Threshold Pulse Width: 0.5 ms
Lead Channel Pacing Threshold Pulse Width: 0.5 ms
Lead Channel Pacing Threshold Pulse Width: 0.5 ms
Lead Channel Pacing Threshold Pulse Width: 0.5 ms
Lead Channel Sensing Intrinsic Amplitude: 11.6 mV
Lead Channel Sensing Intrinsic Amplitude: 3.6 mV
Lead Channel Setting Pacing Amplitude: 2 V
Lead Channel Setting Pacing Amplitude: 2.5 V
Lead Channel Setting Pacing Pulse Width: 0.5 ms
Lead Channel Setting Sensing Sensitivity: 0.5 mV
Pulse Gen Serial Number: 810029878

## 2023-03-20 MED ORDER — HYDRALAZINE HCL 100 MG PO TABS
100.0000 mg | ORAL_TABLET | Freq: Three times a day (TID) | ORAL | 3 refills | Status: DC
  Filled 2023-07-24: qty 270, 90d supply, fill #0
  Filled 2023-08-15 – 2023-11-17 (×4): qty 270, 90d supply, fill #1

## 2023-03-20 MED ORDER — METOPROLOL TARTRATE 25 MG PO TABS: 25.0000 mg | ORAL_TABLET | Freq: Two times a day (BID) | ORAL | 3 refills | Status: DC

## 2023-03-20 MED ORDER — SOTALOL HCL 120 MG PO TABS: 120.0000 mg | ORAL_TABLET | Freq: Two times a day (BID) | ORAL | 1 refills | Status: DC

## 2023-03-20 NOTE — Patient Instructions (Addendum)
Medication Instructions:  Your physician has recommended you make the following change in your medication:  RESTART Metoprolol Tartrate (Lopressor) 25 mg twice daily Make sure you are taking your Eliquis 5 mg TWICE daily  *If you need a refill on your cardiac medications before your next appointment, please call your pharmacy*   Lab Work: Today: CBC & Magnesium  If you have labs (blood work) drawn today and your tests are completely normal, you will receive your results only by: MyChart Message (if you have MyChart) OR A paper copy in the mail If you have any lab test that is abnormal or we need to change your treatment, we will call you to review the results.   Testing/Procedures: None ordered   Follow-Up: At [facility], you and your health needs are our priority.  As part of our continuing mission to provide you with exceptional heart care, we have created designated Provider Care Teams.  These Care Teams include your primary Cardiologist (physician) and Advanced Practice Providers (APPs -  Physician Assistants and Nurse Practitioners) who all work together to provide you with the care you need, when you need it.   Remote monitoring is used to monitor your Pacemaker or ICD from home. This monitoring reduces the number of office visits required to check your device to one time per year. It allows [place] to keep an eye on the functioning of your device to ensure it is working properly. You are scheduled for a device check from home on 05/27/2023. You may send your transmission at any time that day. If you have a wireless device, the transmission will be sent automatically. After your physician reviews your transmission, you will receive a postcard with your next transmission date.  Your next appointment:   2 month(s)  The format for your next appointment:   In Person  Provider:   Jari Favre, PA-C       Then, Dr. Ladona Ridgel or Yevette Edwards, PA will plan to see you again in 4 month(s).     Thank you for choosing [facility]!!   [phone]

## 2023-03-21 LAB — MAGNESIUM: Magnesium: 2.3 mg/dL (ref 1.6–2.3)

## 2023-03-21 LAB — CBC
Hematocrit: 45.2 % (ref 37.5–51.0)
Hemoglobin: 15.6 g/dL (ref 13.0–17.7)
MCH: 32.5 pg (ref 26.6–33.0)
MCHC: 34.5 g/dL (ref 31.5–35.7)
MCV: 94 fL (ref 79–97)
Platelets: 219 10*3/uL (ref 150–450)
RBC: 4.8 x10E6/uL (ref 4.14–5.80)
RDW: 13 % (ref 11.6–15.4)
WBC: 7 10*3/uL (ref 3.4–10.8)

## 2023-03-22 NOTE — Telephone Encounter (Signed)
Pt needs to contact pcp

## 2023-03-27 NOTE — Telephone Encounter (Signed)
Patient contacted RN Kimrey requested to speak with NP.  Contacted patient after office visits appts completed and patient reported he took lasix this am weight 235lbs on awakening.  Had noticed very fatigued Monday took nap after work and slept until 6pm.  Using his inhalers and shortness of breath with exertion.  Lower eyelids still swollen/allergies bothering him.  Sp02 stable mid 90s.  I worked outside a lot this weekend.  Patient seen in workcenter sp02 96% 1+/4 edema nonpitting bilateral lower extremities spoke full sentences without difficulty respirations even and unlabored skin warm dry and pink.  No throat clearing/nasal congestion/cough/wheezing/shortness of breath observed in workcenter.  BBS CTA.  Discussed with patient breathing was labored last year when humidity elevated and temperatures over 90 degrees.  Lungs clear no fluid/wheezing on exam and sp02 at baseline.  Most likely over exerted this weekend, discussed tree pollen counts still high.  Continue allergy medications/stay inside when temperatures over 90 consider mask wear if outside for prolonged periods air quality 65 today humidity 65% and wind gusts up to 20 mph today.  Discussed weather to be similar through upcoming weekend.  Continue weighing am and taking lasix po prn if weight gain more than 3 lbs daily or 5 lbs per week  Patient agreed with plan of care and had no further questions at this time.

## 2023-03-28 ENCOUNTER — Telehealth: Payer: Self-pay | Admitting: *Deleted

## 2023-03-28 NOTE — Telephone Encounter (Signed)
   Pre-operative Risk Assessment    Patient Name: Ryan Glass  DOB: 1956-08-09 MRN: [id number]      Request for Surgical Clearance    Procedure:   CATARACT EXTRACTION BY PE, IOL-RIGHT EYE  Date of Surgery:  Clearance 04/11/23                                 Surgeon:  DR. Mack Hook [facility] Phone number:  [phone] EXT: [phone] Fax number:  [phone]   Type of Clearance Requested:   - Medical ; NO MEDICATIONS LISTED AS NEEDING TO BE HELD; THOUGH ASKING FOR DEVICE CLEARANCE AS WELL, I WILL FORWARD TO DEVICE CLINIC.   Type of Anesthesia:  Not Indicated; GENERALLY TOPICAL ANESTHESIA IS USED THOUGH NOT LISTED   Additional requests/questions:    Elpidio Anis   03/28/2023, 5:51 PM

## 2023-03-29 NOTE — Telephone Encounter (Signed)
[employer] is calling to request another clearance fax be sent due to not receiving one earlier.

## 2023-03-29 NOTE — Telephone Encounter (Signed)
Clearance has been faxed again, both electronically and old school manual fax.

## 2023-03-29 NOTE — Telephone Encounter (Signed)
   Patient Name: Ryan Glass  DOB: [birth year] MRN: [id number]  Primary Cardiologist: [name], MD  Chart reviewed as part of pre-operative protocol coverage. Cataract extractions are recognized in guidelines as low risk surgeries that do not typically require specific preoperative testing or holding of blood thinner therapy. Therefore, given past medical history and time since last visit, based on ACC/AHA guidelines, Ryan Glass would be at acceptable risk for the planned procedure without further cardiovascular testing.   I will route this recommendation to the requesting party via Epic fax function and remove from pre-op pool.  Please call with questions.  Levi Aland, NP-C  03/29/2023, 7:57 AM [address] Office [phone] Fax [phone]

## 2023-04-01 HISTORY — PX: CATARACT EXTRACTION: SUR2

## 2023-04-02 NOTE — Telephone Encounter (Signed)
[employer] is calling back to give alternative fax # since their original is still not working.   Fax # [phone]

## 2023-04-02 NOTE — Telephone Encounter (Signed)
Clearance notes have been re-faxed to surgeon office to new fax # given today [phone]. I will fax notes again today.

## 2023-04-03 NOTE — Progress Notes (Signed)
Remote ICD transmission.   

## 2023-04-05 ENCOUNTER — Encounter: Payer: Self-pay | Admitting: Internal Medicine

## 2023-04-05 NOTE — Progress Notes (Signed)
PERIOPERATIVE PRESCRIPTION FOR IMPLANTED CARDIAC DEVICE PROGRAMMING  Patient Information: Name:  ALEISTER LADY  DOB:  1956/06/12  MRN:  [id number]  Procedure:   CATARACT EXTRACTION BY PE, IOL-RIGHT EYE   Date of Surgery:  Clearance 04/11/23                                 Surgeon:  DR. Mack Hook Surgeon's Group or Practice Name:  [facility]  Device Information:  Clinic EP Physician:  [name], MD   Device Type:  Defibrillator Manufacturer and Phone #:  [employer]: [phone] Pacemaker Dependent?:  No. Date of Last Device Check:  03/20/2023 Normal Device Function?:  Yes.    Electrophysiologist's Recommendations:  Have magnet available. Provide continuous ECG monitoring when magnet is used or reprogramming is to be performed.  Procedure should not interfere with device function.  No device programming or magnet placement needed.  Per Device Clinic Standing Orders, Wiliam Ke, RN  9:40 AM 04/05/2023

## 2023-04-28 ENCOUNTER — Other Ambulatory Visit: Payer: Self-pay | Admitting: Physician Assistant

## 2023-04-28 ENCOUNTER — Other Ambulatory Visit: Payer: Self-pay | Admitting: Internal Medicine

## 2023-04-30 ENCOUNTER — Encounter: Payer: Self-pay | Admitting: Registered Nurse

## 2023-04-30 ENCOUNTER — Ambulatory Visit: Payer: No Typology Code available for payment source | Admitting: Registered Nurse

## 2023-04-30 VITALS — BP 136/93 | HR 88 | Temp 97.9°F | Resp 18

## 2023-04-30 DIAGNOSIS — J209 Acute bronchitis, unspecified: Secondary | ICD-10-CM

## 2023-04-30 DIAGNOSIS — I10 Essential (primary) hypertension: Secondary | ICD-10-CM

## 2023-04-30 MED ORDER — BENZONATATE 200 MG PO CAPS: 200.0000 mg | ORAL_CAPSULE | Freq: Three times a day (TID) | ORAL | 0 refills | Status: AC | PRN

## 2023-04-30 MED ORDER — SALINE SPRAY 0.65 % NA SOLN: 2.0000 | NASAL | 0 refills | Status: DC

## 2023-04-30 MED ORDER — ALBUTEROL SULFATE HFA 108 (90 BASE) MCG/ACT IN AERS: 2.0000 | INHALATION_SPRAY | RESPIRATORY_TRACT | 1 refills | Status: DC | PRN

## 2023-04-30 NOTE — Progress Notes (Signed)
Covid negative

## 2023-04-30 NOTE — Progress Notes (Signed)
Subjective:    Patient ID: Ryan Glass, male    DOB: 1956-02-17, 67 y.o.   MRN: [id number]  67y/o married [place] established male here for evaluation cough productive yellow/green [place] [facility] [place].  Did not take his lasix when on vacation and when he had checked weight at home had gained 10 lbs.  Had meetings this morning hasn't taken lasix yet today plans to take after clinic appt.  Is using his purple inhaler and needs refill on his red inhaler as he ran out  He is thinking about [facility] his [facility] but wants Rx sent to [facility] today.  He also needs refill on tessalon pearles.  He is wondering if he needs antibiotics today for lung infection  Denied fever/chills, loss of taste/smell, n/v/d, body aches, wheezing or chest pain.  He has not home covid tested with new cough symptoms  Using purple inhaler 2 puffs am/pm.  Sp02 over 95 at home this week.  Patient reported he had cataract surgery since last office visit and vision improved      Review of Systems  Constitutional:  Negative for chills and fever.  HENT:  Positive for congestion, postnasal drip, rhinorrhea, sinus pressure and sinus pain. Negative for sore throat, trouble swallowing and voice change.   Eyes:  Negative for photophobia and visual disturbance.  Respiratory:  Positive for cough and shortness of breath. Negative for wheezing and stridor.   Gastrointestinal:  Negative for diarrhea, nausea and vomiting.  Genitourinary:  Negative for difficulty urinating.  Musculoskeletal:  Negative for gait problem, myalgias and neck stiffness.  Allergic/Immunologic: Positive for environmental allergies.  Neurological:  Negative for dizziness, tremors, syncope, weakness and headaches.  Psychiatric/Behavioral:  Negative for agitation, confusion and sleep disturbance.        Objective:   Physical Exam Vitals and nursing note reviewed.  Constitutional:      General:  He is awake. He is not in acute distress.    Appearance: He is well-developed. He is obese. He is ill-appearing. He is not toxic-appearing or diaphoretic.  HENT:     Head: Normocephalic and atraumatic.     Jaw: There is normal jaw occlusion. No trismus.     Salivary Glands: Right salivary gland is not diffusely enlarged or tender. Left salivary gland is not diffusely enlarged or tender.     Right Ear: Ear canal and external ear normal. No decreased hearing noted. No laceration, drainage, swelling or tenderness. A middle ear effusion is present. There is no impacted cerumen. No foreign body. No mastoid tenderness. No PE tube. No hemotympanum. Tympanic membrane is not injected, scarred, perforated, erythematous or retracted.     Left Ear: Hearing, ear canal and external ear normal. No decreased hearing noted. No laceration, drainage, swelling or tenderness. A middle ear effusion is present. There is no impacted cerumen. No foreign body. No mastoid tenderness. No PE tube. No hemotympanum. Tympanic membrane is not injected, scarred, perforated, erythematous or retracted.     Nose: Mucosal edema and rhinorrhea present. No nasal deformity, septal deviation or laceration. Rhinorrhea is clear.     Right Turbinates: Enlarged. Not swollen or pale.     Left Turbinates: Enlarged. Not swollen or pale.     Right Sinus: No maxillary sinus tenderness or frontal sinus tenderness.     Left Sinus: No maxillary sinus tenderness or frontal sinus tenderness.     Mouth/Throat:     Lips: Pink.  No lesions.     Mouth: Mucous membranes are not pale, not dry and not cyanotic. No lacerations or oral lesions.     Dentition: Abnormal dentition. Has dentures. No dental caries, dental abscesses or gum lesions.     Tongue: No lesions. Tongue does not deviate from midline.     Palate: No mass and lesions.     Pharynx: Uvula midline. Pharyngeal swelling and posterior oropharyngeal erythema present. No oropharyngeal exudate or uvula  swelling.     Tonsils: No tonsillar exudate or tonsillar abscesses. 0 on the right. 0 on the left.     Comments: Cobblestoning posterior pharynx; bilateral TMs intact air fluid level clear; bilateral allergic shiners; lower eyelids edema nonpitting 1-2+/4 Eyes:     General: Lids are normal. Vision grossly intact. Gaze aligned appropriately. Allergic shiner present. No scleral icterus.       Right eye: No foreign body, discharge or hordeolum.        Left eye: No foreign body, discharge or hordeolum.     Extraocular Movements: Extraocular movements intact.     Right eye: Normal extraocular motion and no nystagmus.     Left eye: Normal extraocular motion and no nystagmus.     Conjunctiva/sclera: Conjunctivae normal.     Right eye: Right conjunctiva is not injected. No chemosis, exudate or hemorrhage.    Left eye: Left conjunctiva is not injected. No chemosis, exudate or hemorrhage.    Pupils: Pupils are equal, round, and reactive to light. Pupils are equal.     Right eye: Pupil is round and reactive.     Left eye: Pupil is round and reactive.  Neck:     Thyroid: No thyroid mass or thyromegaly.     Trachea: Trachea normal. No tracheal tenderness or tracheal deviation.  Cardiovascular:     Rate and Rhythm: Normal rate and regular rhythm.     Chest Wall: PMI is not displaced.     Pulses: Normal pulses.     Heart sounds: Normal heart sounds, S1 normal and S2 normal. No murmur heard.    No friction rub. No gallop.     Comments: 1+/4 nonpitting edema bilateral lower legs sock line Pulmonary:     Effort: Pulmonary effort is normal. No respiratory distress.     Breath sounds: Normal breath sounds and air entry. No stridor, decreased air movement or transmitted upper airway sounds. No decreased breath sounds, wheezing, rhonchi or rales.     Comments: Sp02 95-96% sitting and talking in clinic on arrival and duration of office visit; cough wet sounding productive but nothing expectorated into tissue;  BBS CTA throughout fields Abdominal:     General: There is no distension.     Palpations: Abdomen is soft.  Musculoskeletal:        General: No tenderness. Normal range of motion.     Right hand: Normal strength. Normal capillary refill.     Left hand: Normal strength. Normal capillary refill.     Cervical back: Normal range of motion and neck supple. No swelling, edema, deformity, erythema, signs of trauma, lacerations, rigidity, spasms, torticollis, tenderness or crepitus. No spinous process tenderness or muscular tenderness.     Thoracic back: No swelling, edema, deformity, signs of trauma, lacerations or tenderness.     Lumbar back: No swelling, edema, deformity, signs of trauma, lacerations or spasms.     Right lower leg: 1+ Edema present.     Left lower leg: 1+ Edema present.     Comments:  AROM c/t/l-spine at baseline  Lymphadenopathy:     Head:     Right side of head: No submental, submandibular, tonsillar, preauricular, posterior auricular or occipital adenopathy.     Left side of head: No submental, submandibular, tonsillar, preauricular, posterior auricular or occipital adenopathy.     Cervical: No cervical adenopathy.     Right cervical: No superficial, deep or posterior cervical adenopathy.    Left cervical: No superficial, deep or posterior cervical adenopathy.  Skin:    General: Skin is warm and dry.     Capillary Refill: Capillary refill takes less than 2 seconds.     Coloration: Skin is not ashen, cyanotic, jaundiced, mottled, pale or sallow.     Findings: No abrasion, abscess, acne, bruising, burn, ecchymosis, erythema, signs of injury, laceration, lesion, petechiae or rash.     Nails: There is no clubbing.  Neurological:     General: No focal deficit present.     Mental Status: He is alert and oriented to person, place, and time. Mental status is at baseline.     GCS: GCS eye subscore is 4. GCS verbal subscore is 5. GCS motor subscore is 6.     Cranial Nerves: Cranial  nerves 2-12 are intact. No cranial nerve deficit, dysarthria or facial asymmetry.     Sensory: Sensation is intact. No sensory deficit.     Motor: Motor function is intact. No weakness, tremor, atrophy, abnormal muscle tone or seizure activity.     Coordination: Coordination is intact. Coordination normal.     Gait: Gait normal.     Comments: In/out of chair without difficulty; gait sure and steady in clinic; bilateral hand grasp equal 5/5  Psychiatric:        Speech: Speech normal.        Behavior: Behavior normal. Behavior is cooperative.        Thought Content: Thought content normal.        Judgment: Judgment normal.      0-1+/4 bilateral lower edema nonpitting nontender Sp02 96% RA BBS CTA occasional cough productive sounding but nothing expectorated Lower eyelid edema     Assessment & Plan:   A-Elevated blood pressure, acute bronchitis  P-Take lasix now and  see RN Kimrey before 5pm for BP recheck.  He did take his other blood pressure medications on usual schedule. Patient reported 10 lb weight gain after camping at [facility] last week; reminder cardiology has recommended keep fluid less than 2 liters per day and lasix if more than 3 lb gain in a day or 5lbs over multiple days Avoid further added salt or caffeine intake today  Patient stated he did eat higher sodium content foods while campling.  ER if chest pain, worst headache of life, dyspnea or visual changes for re-evaluation.  Patient verbalized understanding information/instructions, agreed with plan of care and had no further questions at this time.   home covid test given free home test from [place] govt to complete now. Discussed most likely viral/allergic related cough.  Continue taking singulair 10mg  po qhs, claritin 10mg  po daily, nasal saline 2 sprays each nostril q2h prn wa congestion, advair 1 puff twice a day Edema legs take lasix now see RN Kimrey before 5pm for BP recheck today and weight check Rx sent to his  pharmacy of choice albuterol HFA 2 puffs po q4-6h prn tightness/wheezing/protracted cough #8 RF1 refill and tessalon pearles 200mg  po TID prn cough #30 RF0.  Shower prior to sleep.  Follow up  re-evaluation if  new or worsening symptoms especially sp02 sustained 90-91%, fever, chills, loss of taste/smell or wheezing/shortness of breath not resolved with rest/inhaler use.  Exitcare handout on bronchitis.  Patient verbalized understanding information/instructions, agreed with plan of care and had no further questions at this time.

## 2023-04-30 NOTE — Patient Instructions (Signed)

## 2023-05-14 ENCOUNTER — Encounter: Payer: Self-pay | Admitting: Registered Nurse

## 2023-05-14 ENCOUNTER — Ambulatory Visit: Payer: No Typology Code available for payment source | Admitting: Registered Nurse

## 2023-05-14 VITALS — BP 130/87 | HR 91 | Temp 97.5°F | Resp 16 | Wt 239.0 lb

## 2023-05-14 DIAGNOSIS — J301 Allergic rhinitis due to pollen: Secondary | ICD-10-CM

## 2023-05-14 DIAGNOSIS — R051 Acute cough: Secondary | ICD-10-CM

## 2023-05-14 NOTE — Patient Instructions (Signed)
Asthma Triggers and Exacerbation In this video, you will learn about exposures and other factors that can lead to asthma attacks and trouble controlling asthma. To view the content, go to this web address: [email or web address]  This video will expire on: 02/06/2025. If you need access to this video following this date, please reach out to the healthcare provider who assigned it to you. This information is not intended to replace advice given to you by your health care provider. Make sure you discuss any questions you have with your health care provider. Elsevier Patient Education  2024 Elsevier Inc. Allergic Rhinitis, Adult  Allergic rhinitis is an allergic reaction that affects the mucous membrane inside the nose. The mucous membrane is the tissue that produces mucus. There are two types of allergic rhinitis: Seasonal. This type is also called hay fever and happens only during certain seasons. Perennial. This type can happen at any time of the year. Allergic rhinitis cannot be spread from person to person. This condition can be mild, bad, or very bad. It can develop at any age and may be outgrown. What are the causes? This condition is caused by allergens. These are things that can cause an allergic reaction. Allergens may differ for seasonal allergic rhinitis and perennial allergic rhinitis. Seasonal allergic rhinitis is caused by pollen. Pollen can come from grasses, trees, and weeds. Perennial allergic rhinitis may be caused by: Dust mites. Proteins in a pet's pee (urine), saliva, or dander. Dander is dead skin cells from a pet. Smoke, mold, or car fumes. Remains of or waste from insects such as cockroaches. What increases the risk? You are more likely to develop this condition if you have a family history of allergies or other conditions related to allergies, including: Allergic conjunctivitis. This is irritation and swelling of parts of the eyes and eyelids. Asthma. This  condition affects the lungs and makes it hard to breathe. Atopic dermatitis or eczema. This is long term (chronic) irritation and swelling of the skin. Food allergies. What are the signs or symptoms? Symptoms of this condition include: Sneezing or coughing. A stuffy nose (nasal congestion), itchy nose, or nasal discharge. Itchy eyes and tearing of the eyes. A feeling of mucus dripping down the back of your throat (postnasal drip). This may cause a sore throat. Trouble sleeping. Tiredness. Headache. How is this diagnosed? This condition may be diagnosed with your symptoms, your medical history, and a physical exam. Your health care provider may check for related conditions, such as: Asthma. Pink eye. This is eye swelling and irritation caused by infection (conjunctivitis). Ear infection. Upper respiratory infection. This is an infection in the nose, throat, or upper airways. You may also have tests to find out which allergens cause your symptoms. These may include skin tests or blood tests. How is this treated? There is no cure for this condition, but treatment can help control symptoms. Treatment may include: Taking medicines that block allergy symptoms, such as corticosteroids (anti-inflammatories) and antihistamines. Medicine may be given as a shot, nasal spray, or pill. Avoiding any allergens. Being exposed again and again to tiny amounts of allergens to help you build a defense against allergens (allergenimmunotherapy). This is done if other treatments have not helped. It may include: Allergy shots. These are injected medicines that have small amounts of an allergen in them. Sublingual immunotherapy. This involves taking small doses of a medicine with an allergen in it under your tongue. If these treatments do not work, your provider may prescribe newer, stronger medicines.  Follow these instructions at home: Avoiding allergens Find out what you are allergic to and avoid those  allergens. These are some things you can do to help avoid allergens: If you have perennial allergies: Replace carpet with wood, tile, or vinyl flooring. Carpet can trap dander and dust. Do not smoke. Do not allow smoking in your home Change your heating and air conditioning filters at least once a month. If you have seasonal allergies, take these steps during allergy season: Keep windows closed as much as possible. Plan outdoor activities when pollen counts are lowest. Check pollen counts before you plan outdoor activities When coming indoors, change clothing and shower before sitting on furniture or bedding. If you have a pet in the house that produces allergens: Keep the pet out of the bedroom. Vacuum, sweep, and dust regularly. General instructions Take over-the-counter and prescription medicines only as told by your provider. Drink enough fluid to keep your pee pale yellow. Where to find more information American Academy of Allergy, Asthma & Immunology: aaaai.org Contact a health care provider if: You have a fever. You develop a cough that does not go away. You make high-pitched whistling sounds when you breathe, most often when you breathe out (wheeze). Your symptoms slow you down or stop you from doing your normal activities each day. Get help right away if: You have shortness of breath. This symptom may be an emergency. Get help right away. Call 911. Do not wait to see if the symptoms will go away. Do not drive yourself to the hospital. This information is not intended to replace advice given to you by your health care provider. Make sure you discuss any questions you have with your health care provider. Document Revised: 08/06/2022 Document Reviewed: 08/06/2022 Elsevier Patient Education  2024 [employer].

## 2023-05-14 NOTE — Progress Notes (Signed)
Subjective:    Patient ID: Ryan Glass, male    DOB: 1956/10/04, 67 y.o.   MRN: [id number]  67y/o established [place] married male here for evaluation of cough.  Did not take furosemide 30 May -2 Jun due to cataract surgery last week. 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  Denied known sick contacts/fever/chills/n/v/d.  Has had clear yellow mucous. Denied vomiting after coughing.  Denied teeth/scalp pain.  Using his inahlers advair daily and albuterol prn.  Tessalon pearles don't seem to be helping much has been taking TID.  Has been taking claritin, singulair, flonase and nasal saline.  Needs refill nasal saline     Review of Systems  Constitutional:  Negative for chills, fatigue and fever.  HENT:  Positive for congestion, postnasal drip and rhinorrhea. Negative for dental problem, drooling, ear discharge, ear pain, facial swelling, hearing loss, mouth sores, nosebleeds, sinus pressure, sinus pain, sore throat, tinnitus, trouble swallowing and voice change.   Eyes:  Negative for photophobia and visual disturbance.  Respiratory:  Positive for cough. Negative for choking, chest tightness, shortness of breath, wheezing and stridor.   Cardiovascular:  Negative for chest pain and palpitations.  Gastrointestinal:  Negative for diarrhea, nausea and vomiting.  Genitourinary:  Negative for difficulty urinating.  Musculoskeletal:  Negative for gait problem, neck pain and neck stiffness.  Skin:  Negative for rash.  Allergic/Immunologic: Positive for environmental allergies.  Neurological:  Negative for dizziness, tremors, seizures, syncope, facial asymmetry, speech difficulty, weakness, light-headedness, numbness and headaches.  Hematological:  Negative for adenopathy. Does not bruise/bleed easily.  Psychiatric/Behavioral:  Positive for sleep disturbance. Negative for  agitation and confusion.        Objective:   Physical Exam Vitals and nursing note reviewed.  Constitutional:      General: He is not in acute distress.    Appearance: Normal appearance. He is well-developed and well-groomed. He is obese. He is not ill-appearing, toxic-appearing or diaphoretic.  HENT:     Head: Normocephalic and atraumatic.     Jaw: There is normal jaw occlusion. No trismus.     Salivary Glands: Right salivary gland is not diffusely enlarged or tender. Left salivary gland is not diffusely enlarged or tender.     Right Ear: Hearing, ear canal and external ear normal. A middle ear effusion is present.     Left Ear: Hearing, ear canal and external ear normal. A middle ear effusion is present.     Nose: Mucosal edema and rhinorrhea present. No nasal deformity, septal deviation, laceration or congestion. Rhinorrhea is clear.     Right Turbinates: Enlarged and swollen. Not pale.     Left Turbinates: Enlarged and swollen. Not pale.     Right Sinus: No maxillary sinus tenderness or frontal sinus tenderness.     Left Sinus: No maxillary sinus tenderness or frontal sinus tenderness.     Mouth/Throat:     Lips: Pink. No lesions.     Mouth: Mucous membranes are moist. Mucous membranes are not pale, not dry and not cyanotic. No lacerations, oral lesions or angioedema.     Dentition: Abnormal dentition. Has dentures. No dental abscesses or gum lesions.     Tongue: No lesions. Tongue does not deviate from midline.     Palate: No mass and lesions.     Pharynx: Uvula midline. Pharyngeal swelling and posterior oropharyngeal erythema present. No oropharyngeal exudate  or uvula swelling.     Tonsils: No tonsillar exudate or tonsillar abscesses. 0 on the right. 0 on the left.     Comments: Cobblestoning posterior pharynx; bilateral lower eyelids edema 1-2+/4 bilateral allergic shiners; nasal turbinates edema erythema clear discharge Eyes:     General: Lids are normal. Vision grossly intact.  Gaze aligned appropriately. Allergic shiner present. No scleral icterus.       Right eye: No foreign body, discharge or hordeolum.        Left eye: No foreign body, discharge or hordeolum.     Extraocular Movements: Extraocular movements intact.     Right eye: Normal extraocular motion and no nystagmus.     Left eye: Normal extraocular motion and no nystagmus.     Conjunctiva/sclera: Conjunctivae normal.     Right eye: Right conjunctiva is not injected. No chemosis, exudate or hemorrhage.    Left eye: Left conjunctiva is not injected. No chemosis, exudate or hemorrhage.    Pupils: Pupils are equal, round, and reactive to light. Pupils are equal.     Right eye: Pupil is round and reactive.     Left eye: Pupil is round and reactive.  Neck:     Thyroid: No thyroid mass or thyromegaly.     Trachea: Trachea and phonation normal. No tracheal tenderness or tracheal deviation.  Cardiovascular:     Rate and Rhythm: Normal rate and regular rhythm.     Pulses: Normal pulses.          Radial pulses are 2+ on the right side and 2+ on the left side.     Comments: 0-1+/4 bilateral nonpitting lower leg edema today sock likes wearing ankle socks Pulmonary:     Effort: Pulmonary effort is normal. No respiratory distress.     Breath sounds: Normal breath sounds and air entry. No stridor. No decreased breath sounds, wheezing, rhonchi or rales.     Comments: Spoke full sentences without difficulty; no cough observed in exam room Abdominal:     General: There is no distension.     Palpations: Abdomen is soft.  Musculoskeletal:        General: No tenderness. Normal range of motion.     Right hand: Normal strength. Normal capillary refill.     Left hand: Normal strength. Normal capillary refill.     Cervical back: Normal range of motion and neck supple. No swelling, edema, deformity, erythema, signs of trauma, lacerations, rigidity, spasms, torticollis, tenderness or crepitus. No pain with movement or  muscular tenderness.     Thoracic back: No swelling, edema, deformity, signs of trauma, lacerations, spasms or tenderness.     Right lower leg: Edema present.     Left lower leg: Edema present.  Lymphadenopathy:     Head:     Right side of head: No submental, submandibular, tonsillar, preauricular, posterior auricular or occipital adenopathy.     Left side of head: No submental, submandibular, tonsillar, preauricular, posterior auricular or occipital adenopathy.     Cervical: No cervical adenopathy.     Right cervical: No superficial, deep or posterior cervical adenopathy.    Left cervical: No superficial, deep or posterior cervical adenopathy.  Skin:    General: Skin is warm and dry.     Capillary Refill: Capillary refill takes less than 2 seconds.     Coloration: Skin is not ashen, cyanotic, jaundiced, mottled, pale or sallow.     Findings: No abrasion, abscess, acne, bruising, burn, ecchymosis, erythema, signs of injury,  laceration, lesion, petechiae, rash or wound.     Nails: There is no clubbing.  Neurological:     General: No focal deficit present.     Mental Status: He is alert and oriented to person, place, and time. Mental status is at baseline.     GCS: GCS eye subscore is 4. GCS verbal subscore is 5. GCS motor subscore is 6.     Cranial Nerves: Cranial nerves 2-12 are intact. No cranial nerve deficit, dysarthria or facial asymmetry.     Sensory: No sensory deficit.     Motor: Motor function is intact. No weakness, tremor, atrophy, abnormal muscle tone or seizure activity.     Coordination: Coordination is intact. Coordination normal.     Gait: Gait is intact. Gait normal.     Comments: Gait sure and steady in clinic; in/out of chair without difficulty; bilateral hand grasp equal 5/5  Psychiatric:        Attention and Perception: Attention and perception normal.        Mood and Affect: Mood and affect normal.        Speech: Speech normal.        Behavior: Behavior normal.  Behavior is cooperative.        Thought Content: Thought content normal.        Cognition and Memory: Cognition and memory normal.        Judgment: Judgment normal.           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  Given 1 bottle from clinic stock today.  Stay inside today as temperatures over 2F humidity 87% and pop up thunderstorms which can affect his breathing also  Inhaling sawdust, pollen and being outside all weekend flared up allergies.  Sp02 stable today and BBS CTA discussed no fluid or wheezing audible on exam today.  Ensure he is taking his advair every day. Shower after work/before going to bed.  Discussed post nasal drip irritating throat/triggering cough need to get drip dried up.  May trial phenylephrine 5mg  po q6h has worked well for him in the past given 4 UD from clinic stock.  Follow up re-evaluation new or worsening symptoms.  Patient verbalized understanding information/instructions and had no further questions at this time.  Discussed take furosemide daily when weight above daily 3lb gain or 5 lb weekly gain. Weigh self daily at home on awakening. Last week had to hold preop and then did not restart over the weekend until Monday.  Restrict fluids to 2L or less per day.    Discussed cough most likely combination fluid and allergy flare. Follow up re-evaluation if new or worsening symptoms. Patient verbalized understanding information/instructions and had no further questions at this time.

## 2023-05-21 ENCOUNTER — Ambulatory Visit: Payer: No Typology Code available for payment source | Admitting: Occupational Medicine

## 2023-05-21 ENCOUNTER — Telehealth: Payer: Self-pay | Admitting: Registered Nurse

## 2023-05-21 ENCOUNTER — Encounter: Payer: Self-pay | Admitting: Registered Nurse

## 2023-05-21 VITALS — BP 128/90 | HR 96 | Wt 228.0 lb

## 2023-05-21 DIAGNOSIS — R0609 Other forms of dyspnea: Secondary | ICD-10-CM

## 2023-05-21 DIAGNOSIS — R0902 Hypoxemia: Secondary | ICD-10-CM

## 2023-05-21 DIAGNOSIS — J4531 Mild persistent asthma with (acute) exacerbation: Secondary | ICD-10-CM

## 2023-05-21 MED ORDER — PREDNISONE 10 MG PO TABS: 10.0000 mg | ORAL_TABLET | Freq: Every day | ORAL | 0 refills | Status: AC

## 2023-05-21 NOTE — Telephone Encounter (Signed)
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  Patient reported very fatigued after work.  Has been taking his lasix and advair.  Stated advair inhaler hasn't been working as well as usual "I don't feel like I am getting medication but my counter says I still have puffs left"  Discussed with patient AEPB does not propel medication like old inhalers and must inhale the powder with his breath versus a propellant.  Discussed to have his albuterol inhaler with him at work this week to use prn shortness of breath.  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  He started to get short of breath at half way point but still able to carry on conversation with short phrases no wheezing.  BBS diminished bases with fine crackles. A&Ox3 spoke full sentences without difficulty in clinic Nonpitting trace edema bilateral lower extremities today nontender.  Lower eyelids edema nonpitting 2+/4 allergic shiners;  Patient had taken Claritin 10mg  this am and singulair 10mg  at bedtime yesterday.  He is using nasal saline prn at least BID and flonase 1 spray each nostril BID also.  Denied cough/sinus pain/teeth pain/headache.  Discussed with patient allergies and possible viral infection on top of allergies and recent diuresis with lasix (didn't take lasix 30 May - 2 Jun) restarted last week and weight had reached 239lbs; today 228 lbs.  Increase prednisone by 10mg  daily today and tomorrow and see RN Kimrey tomorrow if worsening.  Consider covid test home.  Discussed staying inside in air conditioning and limiting time outside while  flaring.  Using 2 puffs albuterol q4h prn shortness of breath.  Use inhaler when he arrives home after work today.  Wear cpap when napping/sleeping.  Patient has cardiology appt Friday.  Discussed with patient low oxygen level from baseline would make him feel fatigued.  Medications should help to improve his sp02 that we discussed today.   Asked patient if palpitations worsening this week and he stated he has noticed some in the evenings prior to bedtime but not worse than usual.  Consider potassium level recheck if worsening.  Potassium and magnesium were normal on last check 4/8 and 03/20/23 and CBC without anemia.   Patient agreed with plan of care and had no further questions at this time.

## 2023-05-21 NOTE — Progress Notes (Signed)
Patient reports wanted to check O2 sat was low on his pulse ox. In the mid to high 80's yesterday and today on his pulse ox. Weight at home was 234 lbs this am. Patient taking lasix. Edema is stable to lower legs. Lung sound diminished more than normal. Pulse ox in clinic read 2-4 different on him from his pulse ox. 88-95 in clinic. Educated to try putting on the side of finger not on top to read better. Patient has follow up on Friday with his MD. Educated to use albuterol as needed and to bring to work. Educated if any changes or worsening to contact the clinic. NP made aware. Patient is talking in normal no active labored breathing while sitting.

## 2023-05-23 NOTE — Telephone Encounter (Signed)
Patient seen in workcenter stated sp02 did not drop as low at home last night.  Started extra prednisone Tuesday 10mg .  RA sp02 94% at rest patient eating subway sandwich for lunch stated feeling better after taking extra 10mg  prednisone daily and using albuterol inhaler less dyspnea with exertion.  Discussed with outdoor temperatures over 90 degrees to stay in airconditioning as much as possible continue prn use albuterol, singulair 10mg  po qsh, advair BID and claritin 10mg  po qam and nasal saline.  Shower prior to bedtime.  BBS CTA trace pedal edema today nonpitting  Spoke full sentences without difficulty.  Patient agreed with plan of care and had no further questions at this time.

## 2023-05-24 ENCOUNTER — Other Ambulatory Visit (HOSPITAL_COMMUNITY): Payer: Self-pay

## 2023-05-24 ENCOUNTER — Ambulatory Visit: Payer: No Typology Code available for payment source | Attending: Physician Assistant | Admitting: Physician Assistant

## 2023-05-24 ENCOUNTER — Encounter: Payer: Self-pay | Admitting: Physician Assistant

## 2023-05-24 VITALS — BP 120/84 | HR 90 | Ht 70.0 in | Wt 235.6 lb

## 2023-05-24 DIAGNOSIS — Z79899 Other long term (current) drug therapy: Secondary | ICD-10-CM | POA: Diagnosis not present

## 2023-05-24 DIAGNOSIS — I480 Paroxysmal atrial fibrillation: Secondary | ICD-10-CM | POA: Diagnosis not present

## 2023-05-24 DIAGNOSIS — I10 Essential (primary) hypertension: Secondary | ICD-10-CM

## 2023-05-24 DIAGNOSIS — I2782 Chronic pulmonary embolism: Secondary | ICD-10-CM | POA: Diagnosis not present

## 2023-05-24 DIAGNOSIS — I5032 Chronic diastolic (congestive) heart failure: Secondary | ICD-10-CM | POA: Diagnosis not present

## 2023-05-24 DIAGNOSIS — I422 Other hypertrophic cardiomyopathy: Secondary | ICD-10-CM

## 2023-05-24 DIAGNOSIS — R0609 Other forms of dyspnea: Secondary | ICD-10-CM | POA: Diagnosis not present

## 2023-05-24 DIAGNOSIS — I472 Ventricular tachycardia, unspecified: Secondary | ICD-10-CM

## 2023-05-24 DIAGNOSIS — Q2112 Patent foramen ovale: Secondary | ICD-10-CM

## 2023-05-24 DIAGNOSIS — Z95810 Presence of automatic (implantable) cardiac defibrillator: Secondary | ICD-10-CM

## 2023-05-24 DIAGNOSIS — I7121 Aneurysm of the ascending aorta, without rupture: Secondary | ICD-10-CM

## 2023-05-24 MED ORDER — SOTALOL HCL 120 MG PO TABS
120.0000 mg | ORAL_TABLET | Freq: Two times a day (BID) | ORAL | 3 refills | Status: DC
  Filled 2023-05-24: qty 70, 35d supply, fill #0
  Filled 2023-05-24: qty 110, 55d supply, fill #0
  Filled 2023-07-30: qty 60, 30d supply, fill #1
  Filled 2023-07-30: qty 120, 60d supply, fill #1
  Filled 2023-08-15 – 2023-12-11 (×4): qty 180, 90d supply, fill #2
  Filled 2024-03-26: qty 180, 90d supply, fill #3

## 2023-05-24 MED ORDER — SILDENAFIL CITRATE 50 MG PO TABS
50.0000 mg | ORAL_TABLET | Freq: Every day | ORAL | 0 refills | Status: DC | PRN
  Filled 2023-05-24: qty 10, 10d supply, fill #0

## 2023-05-24 MED ORDER — METOPROLOL SUCCINATE ER 50 MG PO TB24
50.0000 mg | ORAL_TABLET | Freq: Every day | ORAL | 3 refills | Status: DC
  Filled 2023-05-24: qty 90, 90d supply, fill #0
  Filled 2023-07-30: qty 90, 90d supply, fill #1
  Filled 2023-08-15 – 2023-10-28 (×5): qty 90, 90d supply, fill #2
  Filled 2023-12-11 – 2024-02-10 (×3): qty 90, 90d supply, fill #3

## 2023-05-24 NOTE — Progress Notes (Signed)
Office Visit    Patient Name: Ryan Glass Date of Encounter: 05/24/2023  PCP:  [name], MD   [facility]  Cardiologist:  [name], MD  Advanced Practice Provider:  No care team member to display Electrophysiologist:  [name], MD   HPI    Ryan Glass is a 67 y.o. male with a past medical history significant for PVCs status post PVC ablation with Dr. 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  He was last seen by Dr. Mayford Knife 01/26/2022.  History includes hospitalization for chest pain 05/26/2021.  Found to have acute PE with RV failure mildly reduced LVEF 45 to 50%.  Right and left heart catheterization 05/29/2021 showed 20% proximal RCA, 20% proximal to mid circumflex, 30% mid LAD with normal right and left heart pressures.  His shortness of breath was felt to be out of proportion to his very small PE.  Echocardiogram images were reviewed and felt RV was mildly dilated but RV EF was normal.  LVEF was normal.  He was referred to pulmonology for ongoing shortness of breath and restarted on diltiazem 180 mg daily and Lasix was stopped because he was not fluid overloaded.  He was restarted on chlorthalidone 25 mg daily for hypertension.  He was seen August 2022 by Herma Carson, and was still complaining of DOE with little activity.  He was also complaining of low heart rates.  He saw pulmonary for dyspnea on exertion/COPD and placed on O2 by pulmonary.  O2 sats were in the 80s with walking.  Noted on EKG to have bradycardia and frequent PVCs.  Also complaining of leg swelling but had been eating fast food at least 3 days a week.  He wore a heart monitor to assess bradycardia and PVC load and was noted to have symptomatic ventricular  tachycardia on monitor as well as PAF and was admitted to the ER.  Echo showed normal LVEF 66 5% with mild LVH, mildly reduced RV EF and mild RVE and positive PFO.  Started on verapamil for atrial fibrillation/VT/PVCs and monitored on telemetry.  Cardiac MRI showed findings consistent with HOCM and LVEF 52% and RVEF 45%.  He underwent ICD placement 08/07/2021 and was discharged on carvedilol and verapamil.  He was hospitalized October 2022 with worsening shortness of breath with acute hypoxic respiratory failure.  He was eventually weaned off of oxygen and was hypovolemic on admission.  Unfortunately he was found to have GI bleed and underwent EGD showing gastric ulcer and gastritis.  Hemoglobin was stabilized at discharge.  Recommended to hold aspirin until ulcer treatment was completed.  His losartan had been held because of soft blood pressures in the hospital.  Chlorthalidone was stopped and he was continued on oral Lasix and spironolactone.  He was seen by Dr. Excell Seltzer 11/2021 for follow-up of his PFO.  At that time he was no longer hypoxemic and was felt that his chronic dyspnea was multifactorial from chronic CHF, obesity, and deconditioning.  Not felt that transcatheter PFO closure would benefit him.  Also referred to Dr. Richarda Blade for HOCM.  It was recommended that alpha galactosidase level be checked to r/o Fabry's disease.  Genetic testing was also discussed but family wanted to wait until they saw him back in 3 months.  He was last seen by Dr.  Turner 01/26/2022 and was seen by [facility] cardiology and EP for second opinion.  They were concerned that his symptoms may be related to PVCs.  They felt that he did not need to beta-blockers and his metoprolol was stopped but he was continued on sotalol despite having HOCM.  Continued to have shortness of breath.  Not on oxygen.  Daughter was frustrated that nobody could find the cause of his shortness of breath and his oxygen at home intermittently drop down  to 89%.  Denied chest pain or pressure.  Denied palpitations or syncope.  Compliant with medications.  Chronic lower extremity edema.  He was seen by me 2/24, he states he has been feeling much better than the previous year.  Feels good today.  He had his knees replaced May and then July of last year.  He did physical therapy and did well with that.  His last CT scan of his aorta was a year ago so he is due for an annual scan.  Ascending aortic aneurysm measured 4.4 cm at that time.  We discussed tight blood pressure control and he is encouraged to monitor this at home.  He has been compliant with his medications.  We have provided refills today.  He is asked for sildenafil prescription today.  He plans to go to [place] at the end of this month through March so we will try to get his CT scan done before then.  He was seen by Francis Dowse, PA-C and was doing okay at that time.  Occasionally felt like when he got out of the shower his heart rate skipped a beat.  No sustained palpitations.  No syncope/presyncope or dizzy spells.  No chest pain.  Some shortness of breath.  No edema.  ICD was functioning properly.  He remained on sotalol.  Labs were reviewed at that time.  Continue on Eliquis, low PAF burden.  Some hypertension but similar to home BP readings.  Today, he tells me that his heart rate sometimes goes between 110-115bpm.  Oxygen was 88% today.  He tells me he had an incident where he went to take the trash cans out and his heart rate was racing and he felt like he was going to pass out.  He feels exhausted.  Lately, his heart rate has been going off oxygen has been dropping.  It has been as low as 82% at night when he takes his CPAP off to use the restroom.  He saw Dr. Francine Graven in August of last year and likely needs a follow-up which we discussed today.  We discussed ordering oxygen for him to use at home as needed.  I also discussed his EKG with Dr. Ladona Ridgel and we switched his metoprolol to tartrate to  metoprolol succinate.  I continued his sotalol.  He will see him in follow-up in August.   Reports no chest pain, pressure, or tightness. No edema, orthopnea, PND.  Past Medical History    Past Medical History:  Diagnosis Date   AICD (automatic cardioverter/defibrillator) present    Ascending aortic aneurysm (HCC)    a. CT 05/2021 showed 4.4x4.4cm TAA. Stable on chest CTA 12/2021 at 4.4cm.   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 There is severe asymmetric left ventricular hypertrophy. G1DD present.    History of COVID-19 06/07/2021   HOCM (hypertrophic obstructive cardiomyopathy) (HCC)    s/p ICD 07/2021   Hypertension    followed by cardiology, dr t. turner   (05-14-2018 nuclear study in epic , normal perfusion with nuclear ef 61%)   Left hydrocele    Low serum potassium level 04/27/2014   OSA on CPAP    per pt uses every night   Palpitations followed by dr t. Mayford Knife   06-08-2020  still feels palipations due to PVCs when exertion but not with chest pain/ discomfort   PVC's (premature ventricular contractions) cardiologist--- dr t. turner   Status post PVC ablation by Dr. Ladona Ridgel 2019 with recurrence of frequent PVCs/bigeminy;  prior pseudobradycardia r/t pvcs   Rheumatoid arthritis involving [facility])    rheumotology--- dr a. Sharmon Revere  ([facility] in [place])   Past Surgical History:  Procedure Laterality Date   BIOPSY  10/08/2021    Procedure: BIOPSY;  Surgeon: Jenel Lucks, MD;  Location: [facility] ENDOSCOPY;  Service: Gastroenterology;;   Thressa Sheller STUDY  08/18/2021   Procedure: BUBBLE STUDY;  Surgeon: Wendall Stade, MD;  Location: [facility] ENDOSCOPY;  Service: Cardiovascular;;   CATARACT EXTRACTION  04/01/2023   ESOPHAGOGASTRODUODENOSCOPY N/A 10/08/2021   Procedure: ESOPHAGOGASTRODUODENOSCOPY (EGD);  Surgeon: Jenel Lucks, MD;  Location: [facility] ENDOSCOPY;  Service: Gastroenterology;  Laterality: N/A;   HYDROCELE EXCISION Left 06/14/2020   Procedure: LEFT  HYDROCELECTOMY ADULT;  Surgeon: Noel Christmas, MD;  Location: [facility];  Service: Urology;  Laterality: Left;   ICD IMPLANT N/A 08/07/2021   Procedure: ICD IMPLANT;  Surgeon: Marinus Maw, MD;  Location: [facility] INVASIVE CV LAB;  Service: Cardiovascular;  Laterality: N/A;   INCISIONAL HERNIA REPAIR  02-23-2016   @[facility]    LAPAROSCOPIC   LAPAROSCOPIC INGUINAL HERNIA REPAIR Bilateral 08-22-2015  @[facility]    AND UMBILICAL HERNIA REPAIR   PVC ABLATION N/A 10/07/2018   Procedure: PVC ABLATION;  Surgeon: Marinus Maw, MD;  Location: [facility] INVASIVE CV LAB;  Service: Cardiovascular;  Laterality: N/A;   RIGHT/LEFT HEART CATH AND CORONARY ANGIOGRAPHY N/A 05/29/2021   Procedure: RIGHT/LEFT HEART CATH AND CORONARY ANGIOGRAPHY;  Surgeon: Kathleene Hazel, MD;  Location: [facility] INVASIVE CV LAB;  Service: Cardiovascular;  Laterality: N/A;   TEE WITHOUT CARDIOVERSION N/A 08/18/2021   Procedure: TRANSESOPHAGEAL ECHOCARDIOGRAM (TEE);  Surgeon: Wendall Stade, MD;  Location: [facility] ENDOSCOPY;  Service: Cardiovascular;  Laterality: N/A;   TOTAL KNEE ARTHROPLASTY Left 04/30/2022   Procedure: LEFT TOTAL KNEE ARTHROPLASTY;  Surgeon: Gean Birchwood, MD;  Location: [facility] ORS;  Service: Orthopedics;  Laterality: Left;   TOTAL KNEE ARTHROPLASTY Right 06/18/2022   Procedure: RIGHT TOTAL KNEE ARTHROPLASTY;  Surgeon: Gean Birchwood, MD;  Location: [facility] ORS;  Service: Orthopedics;  Laterality:  Right;   UMBILICAL HERNIA REPAIR  child    Allergies  Allergies  Allergen Reactions   Amlodipine Besylate Swelling and Other (See Comments)    Leg swelling with 10 mg daily am dosing; decreased with BID 5 mg dosing   Aspirin Itching    EKGs/Labs/Other Studies Reviewed:  The following studies were reviewed today: Cardiac Cath 05/29/2021 reviewed as below:   Conclusion     Prox RCA lesion is 20% stenosed. Prox Cx to Mid Cx lesion is 20% stenosed. Mid LAD lesion is 30% stenosed.   Mild non-obstructive CAD Normal right and left heart pressures   Recommendations: Medical management of mild CAD. Will resume IV heparin 6 hours post sheath pull given acute PE. Would transition to Eliquis or Xarelto tomorrow.     Echo 05/26/21 IMPRESSIONS     1. Left ventricular ejection fraction, by estimation, is 45 to 50%. The  left ventricle has mildly decreased function. The left ventricle has no  regional wall motion abnormalities. There is severe asymmetric left  ventricular hypertrophy. Left ventricular   diastolic parameters are consistent with Grade I diastolic dysfunction  (impaired relaxation).   2. Right ventricular systolic function is moderately reduced. The right  ventricular size is moderately enlarged. There is moderately elevated  pulmonary artery systolic pressure.   3. Left atrial size was moderately dilated.   4. Right atrial size was mildly dilated.   5. The mitral valve is normal in structure. Mild mitral valve  regurgitation.   6. The aortic valve is normal in structure. Aortic valve regurgitation is  not visualized. No aortic stenosis is present.   7. Aortic dilatation noted. There is mild dilatation of the ascending  aorta, measuring 41 mm.     CT 06/07/21 IMPRESSION: 1. Bland appearing, bandlike scarring and or partial atelectasis of the dependent lungs, unchanged compared to prior examination. No evidence of fibrotic interstitial lung disease. No significant  air trapping on expiratory phase imaging. 2. Cardiomegaly and coronary artery disease. 3. Unchanged enlargement of the tubular ascending thoracic aorta, measuring up to 4.4 x 4.4 cm. Recommend annual imaging followup by CTA or MRA if not otherwise imaged. This recommendation follows 2010 ACCF/AHA/AATS/ACR/ASA/SCA/SCAI/SIR/STS/SVM Guidelines for the Diagnosis and Management of Patients with Thoracic Aortic Disease. Circulation. 2010; 121: G956-O130. Aortic aneurysm NOS (ICD10-I71.9)   Aortic Atherosclerosis (ICD10-I70.0).     Electronically Signed   By: Lauralyn Primes M.D.   On: 06/07/2021 15:12   cMRI 07/2021 IMPRESSION: 1. Asymmetric LV hypertrophy measuring 18mm in basal septum (10mm in basal lateral wall). This meets criteria for hypertrophic cardiomyopathy   2. Patchy late gadolinium enhancement at RV insertion site and basal lateral wall. LGE accounts for 1% of total myocardial mass. While this LGE pattern is consistent with HCM, would also consider Fabry disease as it can present with basal lateral LGE and LVH. Would recommend checking alpha-galactosidase A level   3. Normal LV size and systolic function (EF 52%), though was difficult to quantify volumes/EF due to significant motion artifact during acquisition   4.  Normal RV size with mild systolic dysfunction (EF 45%)   5.  Small pericardial effusion   6.  Dilated ascending aorta measuring 43mm  EKG:  EKG is not ordered today.   Recent Labs: 06/21/2022: B Natriuretic Peptide 77.2 10/01/2022: ALT 24; TSH 0.922 03/18/2023: BUN 22; Creatinine, Ser 1.24; Potassium 3.6; Sodium 142 03/20/2023: Hemoglobin 15.6; Magnesium 2.3; Platelets 219  Recent Lipid Panel    Component Value Date/Time   CHOL 116 10/01/2022 1055   TRIG 88 10/01/2022 1055   HDL 39 (L) 10/01/2022 1055   CHOLHDL 3.0 10/01/2022 1055   CHOLHDL 2.5 05/27/2021 0138   VLDL 13 05/27/2021 0138   LDLCALC 60 10/01/2022 1055     Home Medications   Current  Meds  Medication  Sig   Abatacept (ORENCIA CLICKJECT) 125 MG/ML SOAJ Inject 125 mg into the skin every Sunday.   acetaminophen-codeine (TYLENOL #3) 300-30 MG tablet Take 1 tablet by mouth 2 (two) times daily as needed for moderate pain or severe pain.   albuterol (VENTOLIN HFA) 108 (90 Base) MCG/ACT inhaler Inhale 2 puffs into the lungs every 4 (four) hours as needed for wheezing or shortness of breath.   allopurinol (ZYLOPRIM) 100 MG tablet Take 100 mg by mouth daily.   apixaban (ELIQUIS) 5 MG TABS tablet Take 1 tablet (5 mg total) by mouth 2 (two) times daily.   atorvastatin (LIPITOR) 40 MG tablet Take 1 tablet (40 mg total) by mouth daily.   azelastine (ASTELIN) 0.1 % nasal spray Place 1 spray into both nostrils 2 (two) times daily. Use in each nostril as directed stop pill claritin/zyrtec/loratadine/cetirizine   Cholecalciferol (VITAMIN D3) 50 MCG (2000 UT) capsule Take 1 capsule (2,000 Units total) by mouth daily.   colchicine 0.6 MG tablet Take 2 tablets by mouth daily.   ferrous sulfate 325 (65 FE) MG tablet Take 325 mg by mouth daily.   fluticasone (FLONASE) 50 MCG/ACT nasal spray Place 1 spray into both nostrils daily. (Patient taking differently: Place 1 spray into both nostrils daily as needed for allergies.)   fluticasone-salmeterol (ADVAIR) 100-50 MCG/ACT AEPB Inhale 1 puff into the lungs 2 (two) times daily.   furosemide (LASIX) 40 MG tablet TAKE ONE TABLET BY MOUTH DAILY FOR THREE DAYS **THEN TAKE AS NEEDED FOR INCREASE WEIGHT GAIN** (Patient taking differently: Take 40 mg by mouth daily as needed for fluid or edema (weight gain).)   gabapentin (NEURONTIN) 600 MG tablet Take 600 mg by mouth 3 (three) times daily.   hydrALAZINE (APRESOLINE) 100 MG tablet Take 1 tablet (100 mg total) by mouth 3 (three) times daily.   Magnesium Oxide 400 MG CAPS Take 1 capsule (400 mg total) by mouth every other day. While taking daily multivitamin with 100mg  magnesium every day   metoprolol succinate  (TOPROL-XL) 50 MG 24 hr tablet Take 1 tablet (50 mg total) by mouth daily. Take with or immediately following a meal.   montelukast (SINGULAIR) 10 MG tablet TAKE 1 TABLET BY MOUTH AT BEDTIME   Multiple Vitamin (MULTIVITAMIN WITH MINERALS) TABS tablet Take 1 tablet by mouth daily.   potassium chloride SA (KLOR-CON M) 20 MEQ tablet Take 1 tablet by  mouth only when you have to use a Lasix (Patient taking differently: Take 20 mEq by mouth daily as needed (with each dose of Lasix/furosemide).)   potassium chloride SA (KLOR-CON M) 20 MEQ tablet Take 1 tablet (20 mEq total) by mouth daily as needed. Take 1 tab daily when taking lasix/furosemide 40mg    predniSONE (DELTASONE) 10 MG tablet Take 0.5 tablets (5 mg total) by mouth daily with breakfast.   sildenafil (VIAGRA) 50 MG tablet Take 1 tablet (50 mg total) by mouth daily as needed for ED   sodium chloride (OCEAN) 0.65 % SOLN nasal spray Place 2 sprays into both nostrils every 2 (two) hours while awake.   spironolactone (ALDACTONE) 25 MG tablet TAKE 1 TABLET BY MOUTH DAILY   [DISCONTINUED] metoprolol tartrate (LOPRESSOR) 25 MG tablet Take 1 tablet (25 mg total) by mouth 2 (two) times daily.   [DISCONTINUED] sotalol (BETAPACE) 120 MG tablet TAKE 1 TABLET(120 MG) BY MOUTH EVERY 12 HOURS     Review of Systems      All other systems reviewed and are otherwise negative except as noted  above.  Physical Exam    VS:  BP 120/84   Pulse 90   Ht 5\' 10"  (1.778 m)   Wt 235 lb 9.6 oz (106.9 kg)   SpO2 (!) 88% Comment: Per patient at home O2% 82-85%  BMI 33.81 kg/m  , BMI Body mass index is 33.81 kg/m.  Wt Readings from Last 3 Encounters:  05/24/23 235 lb 9.6 oz (106.9 kg)  05/21/23 228 lb (103.4 kg)  05/14/23 239 lb (108.4 kg)     GEN: Well nourished, well developed, in no acute distress. HEENT: normal. Neck: Supple, no JVD, carotid bruits, or masses. Cardiac: RRR, no murmurs, rubs, or gallops. No clubbing, cyanosis, edema.  Radials/PT 2+ and equal  bilaterally.  Respiratory:  Respirations regular and unlabored, clear to auscultation bilaterally. GI: Soft, nontender, nondistended. MS: No deformity or atrophy. Skin: Warm and dry, no rash. Neuro:  Strength and sensation are intact. Psych: Normal affect.  Assessment & Plan    ICD/VT/Afib -stable function when seen 4/10 by EP, base rate 80 -continue sotalol and metoprolol (metoprolol tartrate changed to metoprolol succinate) -90-day supply and refills provided for both  PFO -not felt that transcatheter closure would benefit him  HOCM -LVEF 65-70%, grade 1DD, no significant valvular disease, dilatation of the aortic root 47mm.  -continue current medication regimen -Sildenafil prescription provided today, no adverse side effects  DOE -this has improved and he has been feeling much better but now getting worse, likely related to his elevated HR -he does not think the metoprolol been helping-we have changed to Toprol XL  HTN -well controlled BP 120/84 today -recommended to monitor close at home -continue current medications including hydralazine 100 mg 3 times daily and sotalol 120 mg twice daily, metoprolol succinate 50mg  daily, spironolactone 25 mg daily, Lasix 40 mg as needed with supplemental potassium as needed  Chronic diastolic heart failure -asymptomatic other than SOB and euvolemic -continue medications  Chronic pulmonary embolism -continue Eliquis  Aneurysm of ascending aorta without rupture -recommend annual imaging  -continue tight blood pressure control       Follow-up with Dr. Ladona Ridgel already planned for Aug. Disposition: Follow up 6 months with [name], MD or APP.  Signed, Sharlene Dory, PA-C 05/24/2023, 4:47 PM [facility]

## 2023-05-24 NOTE — Patient Instructions (Addendum)
Medication Instructions:   START TAKING : METOPROLOL SUCCINATE 50  MG ONCE  A DAY    STOP TAKING:  METOPROLOL TARTRATE    *If you need a refill on your cardiac medications before your next appointment, please call your pharmacy*   Lab Work: NONE ORDERED  TODAY    If you have labs (blood work) drawn today and your tests are completely normal, you will receive your results only by: MyChart Message (if you have MyChart) OR A paper copy in the mail If you have any lab test that is abnormal or we need to change your treatment, we will call you to review the results.   Testing/Procedures: NONE ORDERED  TODAY    Follow-Up: At [facility], you and your health needs are our priority.  As part of our continuing mission to provide you with exceptional heart care, we have created designated Provider Care Teams.  These Care Teams include your primary Cardiologist (physician) and Advanced Practice Providers (APPs -  Physician Assistants and Nurse Practitioners) who all work together to provide you with the care you need, when you need it.  We recommend signing up for the patient portal called "MyChart".  Sign up information is provided on this After Visit Summary.  MyChart is used to connect with patients for Virtual Visits (Telemedicine).  Patients are able to view lab/test results, encounter notes, upcoming appointments, etc.  Non-urgent messages can be sent to your provider as well.   To learn more about what you can do with MyChart, go to [email or web address].    Your next appointment: AS SCHEDULED WITH  PROVIDER  DR Ladona Ridgel     Other Instructions  AN ORDER FOR OXYGEN HAS BEEN PUT IN FOR YOU AND SIGNED BY PROVIDER.CONTE PA-C  PLEASE TAKE TO MEDICAL SUPPLY STORE AND OBTAIN YOUR OXYGEN

## 2023-05-27 ENCOUNTER — Other Ambulatory Visit (HOSPITAL_COMMUNITY): Payer: Self-pay

## 2023-05-27 ENCOUNTER — Ambulatory Visit: Payer: No Typology Code available for payment source | Admitting: Occupational Medicine

## 2023-05-27 ENCOUNTER — Ambulatory Visit (INDEPENDENT_AMBULATORY_CARE_PROVIDER_SITE_OTHER): Payer: No Typology Code available for payment source

## 2023-05-27 DIAGNOSIS — Z4502 Encounter for adjustment and management of automatic implantable cardiac defibrillator: Secondary | ICD-10-CM

## 2023-05-27 DIAGNOSIS — I422 Other hypertrophic cardiomyopathy: Secondary | ICD-10-CM

## 2023-05-27 DIAGNOSIS — H5789 Other specified disorders of eye and adnexa: Secondary | ICD-10-CM

## 2023-05-27 DIAGNOSIS — I472 Ventricular tachycardia, unspecified: Secondary | ICD-10-CM

## 2023-05-27 MED ORDER — ALBUTEROL SULFATE HFA 108 (90 BASE) MCG/ACT IN AERS
2.0000 | INHALATION_SPRAY | RESPIRATORY_TRACT | 0 refills | Status: DC | PRN
  Filled 2023-05-27: qty 6.7, 17d supply, fill #0

## 2023-05-27 MED ORDER — OFLOXACIN 0.3 % OP SOLN
OPHTHALMIC | 0 refills | Status: AC
  Filled 2023-05-27: qty 5, 28d supply, fill #0

## 2023-05-27 MED ORDER — COLCHICINE 0.6 MG PO TABS
1.2000 mg | ORAL_TABLET | Freq: Two times a day (BID) | ORAL | 1 refills | Status: DC | PRN
  Filled 2023-05-27: qty 60, 15d supply, fill #0
  Filled 2023-06-09 – 2023-06-21 (×2): qty 60, 15d supply, fill #1

## 2023-05-27 MED ORDER — KETOROLAC TROMETHAMINE 0.5 % OP SOLN
OPHTHALMIC | 0 refills | Status: AC
  Filled 2023-05-27: qty 5, 28d supply, fill #0

## 2023-05-27 MED ORDER — ALLOPURINOL 100 MG PO TABS
100.0000 mg | ORAL_TABLET | Freq: Every day | ORAL | 3 refills | Status: DC
  Filled 2023-05-27 (×2): qty 90, 90d supply, fill #0
  Filled 2023-07-30 – 2023-08-15 (×2): qty 90, 90d supply, fill #1

## 2023-05-27 MED ORDER — PREDNISOLONE ACETATE 1 % OP SUSP
OPHTHALMIC | 0 refills | Status: AC
  Filled 2023-05-27: qty 5, 28d supply, fill #0

## 2023-05-27 MED ORDER — METOPROLOL TARTRATE 25 MG PO TABS
25.0000 mg | ORAL_TABLET | Freq: Two times a day (BID) | ORAL | 3 refills | Status: DC
  Filled 2023-05-27: qty 90, 45d supply, fill #0

## 2023-05-27 MED ORDER — SOTALOL HCL 120 MG PO TABS
120.0000 mg | ORAL_TABLET | Freq: Two times a day (BID) | ORAL | 2 refills | Status: DC
  Filled 2023-05-27: qty 90, 45d supply, fill #0

## 2023-05-27 MED FILL — Spironolactone Tab 25 MG: ORAL | 30 days supply | Qty: 30 | Fill #0 | Status: AC

## 2023-05-27 NOTE — Progress Notes (Signed)
Reports redness to both eyes worse to right eye started Saturday. No pain now in eyes just on Saturday had pain. No injury. Patient had recent eye surgery one month ago and one two weeks ago. Educated him to call his eye surgeon first. If needs to see Np in am will schedule. Patient given Refresh eye drops today.

## 2023-05-28 ENCOUNTER — Telehealth: Payer: Self-pay | Admitting: Physician Assistant

## 2023-05-28 DIAGNOSIS — R0609 Other forms of dyspnea: Secondary | ICD-10-CM

## 2023-05-28 LAB — CUP PACEART REMOTE DEVICE CHECK
Date Time Interrogation Session: 20240618131756
Implantable Lead Connection Status: 753985
Implantable Lead Connection Status: 753985
Implantable Lead Implant Date: 20220829
Implantable Lead Implant Date: 20220829
Implantable Lead Location: 753859
Implantable Lead Location: 753860
Implantable Pulse Generator Implant Date: 20220829
Pulse Gen Serial Number: 810029878

## 2023-05-28 NOTE — Telephone Encounter (Signed)
New Message:     Patient is trying to get his oxygen tank. They need to talk to a nurse to get some information, so he can get his oxygen please.

## 2023-05-28 NOTE — Telephone Encounter (Signed)
Patient called and stated his order for oxygen did not have the liters he was supposed to be on.  Spoke with [employer]. Will fax new order and last office note to [employer] once order is signed by provider

## 2023-05-29 ENCOUNTER — Other Ambulatory Visit (HOSPITAL_COMMUNITY): Payer: Self-pay

## 2023-05-30 ENCOUNTER — Other Ambulatory Visit (HOSPITAL_COMMUNITY): Payer: Self-pay

## 2023-06-09 MED FILL — Spironolactone Tab 25 MG: ORAL | 30 days supply | Qty: 30 | Fill #1 | Status: CN

## 2023-06-10 ENCOUNTER — Other Ambulatory Visit: Payer: Self-pay

## 2023-06-11 ENCOUNTER — Other Ambulatory Visit (HOSPITAL_COMMUNITY): Payer: Self-pay

## 2023-06-11 ENCOUNTER — Other Ambulatory Visit: Payer: Self-pay | Admitting: Cardiology

## 2023-06-11 ENCOUNTER — Other Ambulatory Visit: Payer: Self-pay

## 2023-06-11 DIAGNOSIS — I480 Paroxysmal atrial fibrillation: Secondary | ICD-10-CM

## 2023-06-11 MED ORDER — APIXABAN 5 MG PO TABS
5.0000 mg | ORAL_TABLET | Freq: Two times a day (BID) | ORAL | 1 refills | Status: DC
Start: 2023-06-11 — End: 2023-06-12

## 2023-06-11 NOTE — Telephone Encounter (Signed)
Prescription refill request for Eliquis received. Indication: afib  Last office visit: 05/24/23 Asa Lente)  Scr: 1.24 (03/18/23)  Age: 67 Weight: 106.9kg  Appropriate dose. Refill sent.

## 2023-06-12 ENCOUNTER — Telehealth: Payer: Self-pay | Admitting: Registered Nurse

## 2023-06-12 ENCOUNTER — Encounter: Payer: Self-pay | Admitting: Registered Nurse

## 2023-06-12 DIAGNOSIS — Z125 Encounter for screening for malignant neoplasm of prostate: Secondary | ICD-10-CM

## 2023-06-12 NOTE — Telephone Encounter (Signed)
Eliquis 5mg  refill request received. Patient is 67 years old, weight-106.9kg, Crea-1.24 on 03/18/23, Diagnosis-Afib, and last seen by Jari Favre on 05/24/23. Dose is appropriate based on dosing criteria. Will send in refill to requested pharmacy.    Previous refill sent on yesterday for 90 tablets and a refill by a other user. Pt takes twice a day so inaccurate refill, resending at this time.

## 2023-06-12 NOTE — Telephone Encounter (Signed)
Patient last PSA normal  Latest Reference Range & Units 09/18/16 00:00 08/30/17 09:11 02/20/21 12:15 03/22/22 09:00  Prostate Specific Ag, Serum 0.0 - 4.0 ng/mL 1.2 1.2 1.2 1.2  Per USPTF annual prostate cancer screening recommended until age 67.  Nonfasting lab ordered today may draw with next due labs.  RN [employer] notified.  Patient had asked for screening  May performed at [facility] or [facility] office.

## 2023-06-14 NOTE — Progress Notes (Signed)
Remote ICD transmission.   

## 2023-06-18 ENCOUNTER — Other Ambulatory Visit: Payer: No Typology Code available for payment source | Admitting: Occupational Medicine

## 2023-06-18 DIAGNOSIS — Z125 Encounter for screening for malignant neoplasm of prostate: Secondary | ICD-10-CM

## 2023-06-18 NOTE — Progress Notes (Signed)
Lab drawn from Left AC tolerated well no issues noted.   

## 2023-06-18 NOTE — Addendum Note (Signed)
Addended by: Sissy Hoff M on: 06/18/2023 10:17 AM   Modules accepted: Orders

## 2023-06-19 LAB — PSA: Prostate Specific Ag, Serum: 1.2 ng/mL (ref 0.0–4.0)

## 2023-06-19 NOTE — Telephone Encounter (Signed)
Review PSA level with patient verbalized understanding no questions at this time. Explained 1.2 since 2017. Normal range 0-4.

## 2023-06-20 ENCOUNTER — Other Ambulatory Visit (HOSPITAL_COMMUNITY): Payer: Self-pay

## 2023-06-21 ENCOUNTER — Other Ambulatory Visit: Payer: Self-pay

## 2023-06-21 ENCOUNTER — Other Ambulatory Visit (HOSPITAL_COMMUNITY): Payer: Self-pay

## 2023-06-21 MED FILL — Spironolactone Tab 25 MG: ORAL | 30 days supply | Qty: 30 | Fill #1 | Status: AC

## 2023-06-23 NOTE — Telephone Encounter (Signed)
Patient seen in workcenter after NP vacation.  Feeling much better  Staying inside in air conditioning during heat index advisory days this week and last week.  Spoke full sentences without difficulty gait sure and steady using inhaler as prescribed shortness of breath has resolved.  Not taking extra prednisone tab any longer.  A&Ox3 skin warm dry and pink.

## 2023-06-25 ENCOUNTER — Other Ambulatory Visit (HOSPITAL_COMMUNITY): Payer: Self-pay

## 2023-06-26 ENCOUNTER — Other Ambulatory Visit (HOSPITAL_COMMUNITY): Payer: Self-pay

## 2023-06-26 MED ORDER — COLCHICINE 0.6 MG PO TABS
0.6000 mg | ORAL_TABLET | Freq: Two times a day (BID) | ORAL | 1 refills | Status: DC | PRN
  Filled 2023-06-26 – 2023-07-30 (×2): qty 60, 30d supply, fill #0
  Filled 2023-08-15 – 2023-09-01 (×2): qty 60, 30d supply, fill #1

## 2023-06-29 ENCOUNTER — Telehealth: Payer: Self-pay | Admitting: Registered Nurse

## 2023-06-29 ENCOUNTER — Encounter: Payer: Self-pay | Admitting: Registered Nurse

## 2023-06-29 DIAGNOSIS — Z7185 Encounter for immunization safety counseling: Secondary | ICD-10-CM

## 2023-06-29 NOTE — Telephone Encounter (Signed)
Patient wondering if he needs booster vaccination at this time.  Last vaccine 12/28/22.  He is on orencia/seen by rheumatology.   Has had 5 covid vaccines since they were available.  Saw on news due to his age booster recommended this spring.  Last positive covid test 2023.  Discussed since he had vaccine January and new formula being released in September I recommend waiting until new formula available to get next dose at this time as less than 60 days between doses if he does now.  Discussed avoid sick contacts as new variants circulating in [place] - 4.  Recommend mask wear if in crowds.  Patient verbalized understanding information/instructions and had no further questions at that time.

## 2023-07-02 ENCOUNTER — Ambulatory Visit: Payer: No Typology Code available for payment source | Admitting: Registered Nurse

## 2023-07-02 ENCOUNTER — Encounter: Payer: Self-pay | Admitting: Registered Nurse

## 2023-07-02 VITALS — BP 129/94 | HR 82 | Temp 97.9°F | Resp 20 | Ht 70.0 in | Wt 232.0 lb

## 2023-07-02 DIAGNOSIS — E860 Dehydration: Secondary | ICD-10-CM

## 2023-07-02 NOTE — Progress Notes (Unsigned)
Subjective:    Patient ID: Ryan Glass, male    DOB: 20-Aug-1956, 67 y.o.   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  Patient  stated got behind on water intake this weekend  Stated cardiology got him home oxygen to use with cpap and now levels staying above 90 at night when sitting in chair or bed  Allergies flaring up some again lower eyelids swollen denied teeth pain/headache/fever/chills/illness    Review of Systems     Objective:   Physical Exam Vitals and nursing note reviewed.  Constitutional:      General: He is awake. He is not in acute distress.    Appearance: Normal appearance. He is well-developed and well-groomed. He is obese. He is not ill-appearing, toxic-appearing or diaphoretic.  HENT:     Head: Normocephalic and atraumatic.     Jaw: There is normal jaw occlusion.     Salivary Glands: Right salivary gland is not diffusely enlarged. Left salivary gland is not diffusely enlarged.     Right Ear: Hearing and external ear normal.     Left Ear: Hearing and external ear normal.     Nose: Nose normal. No congestion or rhinorrhea.     Mouth/Throat:     Lips: Pink. No lesions.     Mouth: Mucous membranes are dry.     Pharynx: Oropharynx is clear. Posterior oropharyngeal erythema present. No oropharyngeal exudate.  Eyes:     General: Lids are normal. Vision grossly intact. Gaze aligned appropriately. No scleral icterus.       Right eye: No discharge.        Left eye: No discharge.     Extraocular Movements: Extraocular movements intact.     Conjunctiva/sclera: Conjunctivae normal.     Pupils: Pupils are equal, round, and reactive to light.  Neck:     Trachea: Trachea normal.  Cardiovascular:     Rate and Rhythm: Normal rate and regular rhythm.     Pulses: Normal pulses.  Pulmonary:     Effort: Pulmonary effort is  normal. No respiratory distress.     Breath sounds: Normal breath sounds and air entry. No stridor or transmitted upper airway sounds. No wheezing, rhonchi or rales.     Comments: Spoke full sentences without difficulty; no cough observed in exam room Abdominal:     Palpations: Abdomen is soft.  Musculoskeletal:        General: Normal range of motion.     Cervical back: Normal range of motion and neck supple. No rigidity.     Right lower leg: No edema.     Left lower leg: No edema.  Lymphadenopathy:     Head:     Right side of head: No submandibular or preauricular adenopathy.     Left side of head: No submandibular or preauricular adenopathy.     Cervical: No cervical adenopathy.     Right cervical: No superficial cervical adenopathy.    Left cervical: No superficial cervical adenopathy.  Skin:    General: Skin is warm and dry.     Capillary Refill: Capillary refill takes less than 2 seconds.     Coloration: Skin is not ashen, cyanotic, jaundiced, mottled, pale or sallow.     Findings: No abrasion, bruising, burn, erythema, signs of injury, laceration, lesion, petechiae, rash or wound.  Neurological:  General: No focal deficit present.     Mental Status: He is alert and oriented to person, place, and time. Mental status is at baseline.     Cranial Nerves: No cranial nerve deficit.     Motor: Motor function is intact. No weakness, tremor, abnormal muscle tone or seizure activity.     Coordination: Coordination is intact. Coordination normal.     Gait: Gait is intact. Gait normal.     Comments: In/out of chair without difficulty; gait sure and steady in clinic; bilateral hand grasp equal 5/5  Psychiatric:        Attention and Perception: Attention and perception normal.        Mood and Affect: Mood and affect normal.        Speech: Speech normal.        Behavior: Behavior normal. Behavior is cooperative.        Thought Content: Thought content normal.        Cognition and  Memory: Cognition and memory normal.        Judgment: Judgment normal.    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  Discussed sipping hourly at work.  Discussed middle ear effusion may be contributing to dizziness with head movements affecting otoliths in ears.  Given 1 bottle nasal saline for use at work.  Continue allergy medications  call clinic if dizziness returns at work today.

## 2023-07-03 NOTE — Patient Instructions (Signed)
Dizziness Dizziness is a common problem. It makes you feel unsteady or light-headed. You may feel like you're about to faint. Dizziness can lead to getting hurt if you stumble or fall. It's more common to feel dizzy if you're an older adult. Many things can cause you to feel dizzy. These include: Medicines. Dehydration. This is when there's not enough water in your body. Illness. Follow these instructions at home: Eating and drinking  Drink enough fluid to keep your pee (urine) pale yellow. This helps keep you from getting dehydrated. Try to drink more clear fluids, such as water. Do not drink alcohol. Try to limit how much caffeine you take in. Try to limit how much salt, also called sodium, you take in. Activity Try not to make quick movements. Stand up slowly from sitting in a chair. Steady yourself until you feel okay. In the morning, first sit up on the side of the bed. When you feel okay, hold onto something and slowly stand up. Do this until you know that your balance is okay. If you need to stand in one place for a long time, move your legs often. Tighten and relax the muscles in your legs while you're standing. Do not drive or use machines if you feel dizzy. Avoid bending down if you feel dizzy. Place items in your home so you can reach them without leaning over. Lifestyle Do not smoke, vape, or use products with nicotine or tobacco in them. If you need help quitting, talk with your health care provider. Try to lower your stress level. You can do this by using methods like yoga or meditation. Talk with your provider if you need help. General instructions Watch your dizziness for any changes. Take your medicines only as told by your provider. Talk with your provider if you think you're dizzy because of a medicine you're taking. Tell a friend or a family member that you're feeling dizzy. If they spot any changes in your behavior, have them call your provider. Contact a health care  provider if: Your dizziness doesn't go away, or you have new symptoms. Your dizziness gets worse. You feel like you may vomit. You have trouble hearing. You have a fever. You have neck pain or a stiff neck. You fall or get hurt. Get help right away if: You vomit each time you eat or drink. You have watery poop and can't eat or drink. You have trouble talking, walking, swallowing, or using your arms, hands, or legs. You feel very weak. You're bleeding. You're not thinking clearly, or you have trouble forming sentences. A friend or family member may spot this. Your vision changes, or you get a very bad headache. These symptoms may be an emergency. Call 911 right away. Do not wait to see if the symptoms will go away. Do not drive yourself to the hospital. This information is not intended to replace advice given to you by your health care provider. Make sure you discuss any questions you have with your health care provider. Document Revised: 01/10/2023 Document Reviewed: 01/10/2023 Elsevier Patient Education  2024 [employer] Dehydration, Adult Dehydration is a condition in which there is not enough water or other fluids in the body. This happens when a person loses more fluids than they take in. Important organs, such as the kidneys, brain, and heart, cannot function without a proper amount of fluids. Any loss of fluids from the body can lead to dehydration. Dehydration can be mild, moderate, or severe. It should be treated  right away to prevent it from becoming severe. What are the causes? Dehydration may be caused by: Health conditions, such as diarrhea, vomiting, fever, infection, or sweating or urinating a lot. Not drinking enough fluids. Certain medicines, such as medicines that remove excess fluid from the body (diuretics). Lack of safe drinking water. Not being able to get enough water and food. What increases the risk? The following factors may make you more likely to develop  this condition: Having a long-term (chronic) illness that has not been treated properly, such as diabetes, heart disease, or kidney disease. Being 51 years of age or older. Having a disability. Living in a place that is high in altitude, where thinner, drier air causes more fluid loss. Doing exercises that put stress on your body for a long time (endurance sports). Being active in a hot climate. What are the signs or symptoms? Symptoms of dehydration depend on how severe it is. Mild or moderate dehydration Thirst. Dry lips or dry mouth. Dizziness or light-headedness. Muscle cramps. Dark urine. Urine may be the color of tea. Less urine or tears produced than usual. Headache. Severe dehydration Changes in skin. Your skin may be cold and clammy, blotchy, or pale. Your skin also may not return to normal after being lightly pinched and released. Little or no tears, urine, or sweat. Rapid breathing and low blood pressure. Your pulse may be weak or may be faster than 100 beats per minute when you are sitting still. Other changes, such as: Feeling very thirsty. Sunken eyes. Cold hands and feet. Confusion. Being very tired (lethargic) or having trouble waking from sleep. Short-term weight loss. Loss of consciousness. How is this diagnosed? This condition is diagnosed based on your symptoms and a physical exam. You may have blood and urine tests to help confirm the diagnosis. How is this treated? Treatment for this condition depends on how severe it is. Treatment should be started right away. Do not wait until dehydration becomes severe. Severe dehydration is an emergency and needs to be treated in a hospital. Mild or moderate dehydration can be treated at home. You may be asked to: Drink more fluids. Drink an oral rehydration solution (ORS). This drink restores fluids, salts, and minerals in the blood (electrolytes). Stop any activities that caused dehydration, such as exercise. Cool off  with cool compresses, cool mist, or cool fluids, if heat or too much sweat caused your condition. Take medicine to treat fever, if fever caused your condition. Take medicine to treat nausea and diarrhea, if vomiting or diarrhea caused your condition. Severe dehydration can be treated: With IV fluids. By correcting abnormal levels of electrolytes in your body. By treating the underlying cause of dehydration. Follow these instructions at home: Oral rehydration solution If told by your health care provider, drink an ORS: Make an ORS by following instructions on the package. Start by drinking small amounts, about  cup (120 mL) every 5-10 minutes. Slowly increase how much you drink until you have taken the amount recommended by your health care provider.  Eating and drinking  Drink enough clear fluid to keep your urine pale yellow. If you were told to drink an ORS, finish the ORS first and then start slowly drinking other clear fluids. Drink fluids such as: Water. Do not drink only water. Doing that can lead to hyponatremia, which is having too little salt (sodium) in the body. Water from ice chips you suck on. Diluted fruit juice. This is fruit juice that you have added water  to. Low-calorie sports drinks. Eat foods that contain a healthy balance of electrolytes, such as bananas, oranges, potatoes, tomatoes, and spinach. Do not drink alcohol. Avoid the following: Drinks that contain a lot of sugar. These include high-calorie sports drinks, fruit juice that is not diluted, and soda. Caffeine. Foods that are greasy or contain a lot of fat or sugar. General instructions Take over-the-counter and prescription medicines only as told by your health care provider. Do not take sodium tablets. Doing that can lead to having too much sodium in the body (hypernatremia). Return to your normal activities as told by your health care provider. Ask your health care provider what activities are safe for  you. Keep all follow-up visits. Your health care provider may need to check your progress and suggest new ways to treat your condition. Contact a health care provider if: You have muscle cramps, pain, or discomfort, such as: Pain in your abdomen and the pain gets worse or stays in one area. Stiff neck. You have a rash. You are more irritable than usual. You are sleepier or have a harder time waking. You feel weak or dizzy. You feel very thirsty. Get help right away if: You have symptoms of severe dehydration. You vomit every time you eat or drink. Your vomiting gets worse, does not go away, or includes blood or green matter (bile). You are getting treatment but symptoms are getting worse. You have a fever. You have a severe headache. You have: Diarrhea that gets worse or does not go away. Blood in your stool. This may cause stool to look black and tarry. Not urinating, or urinating only a small amount of very dark urine, within 6-8 hours. You have trouble breathing. These symptoms may be an emergency. Get help right away. Do not wait to see if the symptoms will go away. Do not drive yourself to the hospital. Call 911. This information is not intended to replace advice given to you by your health care provider. Make sure you discuss any questions you have with your health care provider. Document Revised: 06/25/2022 Document Reviewed: 06/25/2022 Elsevier Patient Education  2024 [employer].

## 2023-07-09 ENCOUNTER — Other Ambulatory Visit: Payer: Self-pay | Admitting: Cardiology

## 2023-07-22 ENCOUNTER — Other Ambulatory Visit (HOSPITAL_COMMUNITY): Payer: Self-pay

## 2023-07-22 ENCOUNTER — Other Ambulatory Visit: Payer: Self-pay

## 2023-07-22 MED FILL — Spironolactone Tab 25 MG: ORAL | 30 days supply | Qty: 30 | Fill #2 | Status: AC

## 2023-07-23 ENCOUNTER — Encounter: Payer: Self-pay | Admitting: Internal Medicine

## 2023-07-23 ENCOUNTER — Ambulatory Visit: Payer: No Typology Code available for payment source | Attending: Internal Medicine | Admitting: Internal Medicine

## 2023-07-23 ENCOUNTER — Other Ambulatory Visit (HOSPITAL_COMMUNITY): Payer: Self-pay

## 2023-07-23 VITALS — BP 110/68 | HR 90 | Ht 70.0 in | Wt 230.0 lb

## 2023-07-23 DIAGNOSIS — I472 Ventricular tachycardia, unspecified: Secondary | ICD-10-CM

## 2023-07-23 LAB — CUP PACEART INCLINIC DEVICE CHECK
Battery Remaining Longevity: 58 mo
Brady Statistic RA Percent Paced: 98 %
Brady Statistic RV Percent Paced: 70 %
Date Time Interrogation Session: 20240813193612
HighPow Impedance: 67.5 Ohm
Implantable Lead Connection Status: 753985
Implantable Lead Connection Status: 753985
Implantable Lead Implant Date: 20220829
Implantable Lead Implant Date: 20220829
Implantable Lead Location: 753859
Implantable Lead Location: 753860
Implantable Pulse Generator Implant Date: 20220829
Lead Channel Impedance Value: 412.5 Ohm
Lead Channel Impedance Value: 462.5 Ohm
Lead Channel Pacing Threshold Amplitude: 0.75 V
Lead Channel Pacing Threshold Amplitude: 0.75 V
Lead Channel Pacing Threshold Amplitude: 1.25 V
Lead Channel Pacing Threshold Amplitude: 1.25 V
Lead Channel Pacing Threshold Pulse Width: 0.5 ms
Lead Channel Pacing Threshold Pulse Width: 0.5 ms
Lead Channel Pacing Threshold Pulse Width: 0.5 ms
Lead Channel Pacing Threshold Pulse Width: 0.5 ms
Lead Channel Sensing Intrinsic Amplitude: 12 mV
Lead Channel Sensing Intrinsic Amplitude: 3.6 mV
Lead Channel Setting Pacing Amplitude: 2 V
Lead Channel Setting Pacing Amplitude: 2.5 V
Lead Channel Setting Pacing Pulse Width: 0.5 ms
Lead Channel Setting Sensing Sensitivity: 0.5 mV
Pulse Gen Serial Number: 810029878

## 2023-07-23 MED ORDER — ACETAMINOPHEN-CODEINE 300-30 MG PO TABS
1.0000 | ORAL_TABLET | Freq: Two times a day (BID) | ORAL | 3 refills | Status: DC | PRN
  Filled 2023-07-23: qty 60, 30d supply, fill #0
  Filled 2023-07-30 – 2023-08-15 (×2): qty 60, 30d supply, fill #1
  Filled 2023-09-01 – 2023-09-16 (×3): qty 60, 30d supply, fill #2
  Filled 2023-10-20: qty 60, 30d supply, fill #3

## 2023-07-23 NOTE — Patient Instructions (Addendum)
Medication Instructions:  Your physician recommends that you continue on your current medications as directed. Please refer to the Current Medication list given to you today.  *If you need a refill on your cardiac medications before your next appointment, please call your pharmacy*  Lab Work: None ordered.  If you have labs (blood work) drawn today and your tests are completely normal, you will receive your results only by: MyChart Message (if you have MyChart) OR A paper copy in the mail If you have any lab test that is abnormal or we need to change your treatment, we will call you to review the results.  Testing/Procedures: CT angio for Sept 2025  Follow-Up: At [facility], you and your health needs are our priority.  As part of our continuing mission to provide you with exceptional heart care, we have created designated Provider Care Teams.  These Care Teams include your primary Cardiologist (physician) and Advanced Practice Providers (APPs -  Physician Assistants and Nurse Practitioners) who all work together to provide you with the care you need, when you need it.   Your next appointment:   December 2025  The format for your next appointment:   In Person  Provider:   [name], MD{or one of the following Advanced Practice Providers on your designated Care Team:   Francis Dowse, [place] Casimiro Needle "Mardelle Matte" [place], [place] Earnest Rosier, NP  Remote monitoring is used to monitor your Pacemaker/ ICD from home. This monitoring reduces the number of office visits required to check your device to one time per year. It allows [place] to keep an eye on the functioning of your device to ensure it is working properly. You are scheduled for a device check from home on 08/26/23. You may send your transmission at any time that day. If you have a wireless device, the transmission will be sent automatically. After your physician reviews your transmission, you will receive a postcard with your next  transmission date.  Important Information About Sugar

## 2023-07-23 NOTE — Progress Notes (Signed)
HPI Ryan Glass returns today for followup. He is a pleasant 67 yo man with HCM, PVC's for which he underwent catheter ablation in 2019, MMVT and aortic aneurysm. 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 He had 10% PVC's mostly monomorphic on his cardiac monitor. Despite normal LV function he has class 2B dyspnea. He admits to dietary indiscretion with sodium. He denies edema. He has minimal palpitations. He was hospitalized a couple of years ago with VT and worsening CHF. He was placed on sotalol. In the interim, he notes that he has had some peripheral edema. No syncope.  Allergies  Allergen Reactions   Amlodipine Besylate Swelling and Other (See Comments)    Leg swelling with 10 mg daily am dosing; decreased with BID 5 mg dosing   Aspirin Itching     Current Outpatient Medications  Medication Sig Dispense Refill   Abatacept (ORENCIA CLICKJECT) 125 MG/ML SOAJ Inject 125 mg into the skin every Sunday.     acetaminophen-codeine (TYLENOL #3) 300-30 MG tablet Take 1 tablet by mouth every 12 (twelve) hours as needed for moderate pain (4-6). 60 tablet 3   albuterol (VENTOLIN HFA) 108 (90 Base) MCG/ACT inhaler Inhale 2 puffs into the lungs every 4 (four) hours as needed for wheezing or shortness of breath 6.7 g 0   allopurinol (ZYLOPRIM) 100 MG tablet Take 1 tablet (100 mg total) by mouth daily. 90 tablet 3   apixaban (ELIQUIS) 5 MG TABS tablet TAKE 1 TABLET(5 MG) BY MOUTH TWICE DAILY 180 tablet 1   atorvastatin (LIPITOR) 40 MG tablet Take 1 tablet (40 mg total) by mouth daily. 90 tablet 1   azelastine (ASTELIN) 0.1 % nasal spray Place 1 spray into both nostrils 2 (two) times daily. Use in each nostril as directed stop pill claritin/zyrtec/loratadine/cetirizine 30 mL 12   Cholecalciferol (VITAMIN D3) 50 MCG (2000 UT) capsule  Take 1 capsule (2,000 Units total) by mouth daily.     colchicine 0.6 MG tablet Take 1 tablet (0.6 mg total) by mouth 2 (two) times a day as needed for muscle/joint pain. 60 tablet 1   ferrous sulfate 325 (65 FE) MG tablet Take 325 mg by mouth daily.     fluticasone (FLONASE) 50 MCG/ACT nasal spray Place 1 spray into both nostrils daily. (Patient taking differently: Place 1 spray into both nostrils daily as needed for allergies.)     fluticasone-salmeterol (ADVAIR) 100-50 MCG/ACT AEPB Inhale 1 puff into the lungs 2 (two) times daily.     furosemide (LASIX) 40 MG tablet Take 1 tablet (40 mg total) by mouth daily as needed for fluid or edema (weight gain). 30 tablet 0   hydrALAZINE (APRESOLINE) 100 MG tablet Take 1 tablet (100 mg total) by mouth 3 (three) times daily. 270 tablet 3   Magnesium Oxide 400 MG CAPS Take 1 capsule (400 mg total) by mouth every other day. While taking daily multivitamin with 100mg  magnesium every day 90 capsule 2   metoprolol succinate (TOPROL-XL) 50 MG 24 hr tablet Take 1 tablet (50 mg total) by mouth daily. Take with or immediately following a meal. 90 tablet 3   montelukast (SINGULAIR) 10 MG tablet TAKE 1 TABLET BY MOUTH AT BEDTIME 30 tablet 3   Multiple Vitamin (MULTIVITAMIN WITH MINERALS) TABS tablet Take 1 tablet by mouth daily.  potassium chloride SA (KLOR-CON M) 20 MEQ tablet Take 1 tablet by  mouth only when you have to use a Lasix (Patient taking differently: Take 20 mEq by mouth daily as needed (with each dose of Lasix/furosemide).) 90 tablet 3   sildenafil (VIAGRA) 50 MG tablet Take 1 tablet (50 mg total) by mouth daily as needed for ED 10 tablet 0   sotalol (BETAPACE) 120 MG tablet Take 1 tablet (120 mg total) by mouth every 12 (twelve) hours. 180 tablet 3   spironolactone (ALDACTONE) 25 MG tablet TAKE 1 TABLET BY MOUTH DAILY 30 tablet 11   sodium chloride (OCEAN) 0.65 % SOLN nasal spray Place 2 sprays into both nostrils every 2 (two) hours while awake.  0    No current facility-administered medications for this visit.     Past Medical History:  Diagnosis Date   AICD (automatic cardioverter/defibrillator) present    Ascending aortic aneurysm (HCC)    a. CT 05/2021 showed 4.4x4.4cm TAA. Stable on chest CTA 12/2021 at 4.4cm.   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 There is severe asymmetric left ventricular hypertrophy. G1DD present.    History of COVID-19 06/07/2021   HOCM (hypertrophic obstructive cardiomyopathy) (HCC)    s/p ICD 07/2021   Hypertension    followed by cardiology, dr t. turner   (05-14-2018 nuclear study in epic , normal perfusion with nuclear ef 61%)   Left hydrocele    Low serum potassium level 04/27/2014   OSA on CPAP    per pt uses every night   Palpitations followed by dr t. Mayford Knife   06-08-2020  still feels palipations due to PVCs when exertion but not with chest pain/ discomfort   PVC's (premature ventricular contractions) cardiologist--- dr t. turner   Status post PVC ablation by Dr. Ladona Ridgel 2019 with recurrence of frequent PVCs/bigeminy;  prior pseudobradycardia r/t pvcs   Rheumatoid arthritis involving multiple sites [facility])    rheumotology--- dr a. Sharmon Revere  ([facility] in [place])    ROS:   All systems reviewed and negative except as noted in the HPI.   Past Surgical History:  Procedure Laterality Date   BIOPSY  10/08/2021   Procedure: BIOPSY;  Surgeon: Jenel Lucks, MD;  Location: [facility] ENDOSCOPY;  Service: Gastroenterology;;   Thressa Sheller STUDY  08/18/2021   Procedure: BUBBLE STUDY;  Surgeon: Wendall Stade, MD;  Location: [facility] ENDOSCOPY;  Service: Cardiovascular;;   CATARACT EXTRACTION  04/01/2023   ESOPHAGOGASTRODUODENOSCOPY N/A 10/08/2021   Procedure: ESOPHAGOGASTRODUODENOSCOPY (EGD);  Surgeon: Jenel Lucks, MD;  Location: [facility] ENDOSCOPY;  Service: Gastroenterology;  Laterality: N/A;   HYDROCELE EXCISION Left 06/14/2020   Procedure: LEFT  HYDROCELECTOMY ADULT;  Surgeon: Noel Christmas, MD;  Location: [facility];  Service: Urology;  Laterality: Left;   ICD IMPLANT N/A 08/07/2021   Procedure: ICD IMPLANT;  Surgeon: Marinus Maw,  MD;  Location: [facility] INVASIVE CV LAB;  Service: Cardiovascular;  Laterality: N/A;   INCISIONAL HERNIA REPAIR  02-23-2016   @[facility]    LAPAROSCOPIC   LAPAROSCOPIC INGUINAL HERNIA REPAIR Bilateral 08-22-2015  @[facility]    AND UMBILICAL HERNIA REPAIR   PVC ABLATION N/A 10/07/2018   Procedure: PVC ABLATION;  Surgeon: Marinus Maw, MD;  Location: [facility] INVASIVE CV LAB;  Service: Cardiovascular;  Laterality: N/A;   RIGHT/LEFT HEART CATH AND CORONARY ANGIOGRAPHY N/A 05/29/2021   Procedure: RIGHT/LEFT HEART CATH AND CORONARY ANGIOGRAPHY;  Surgeon: Kathleene Hazel, MD;  Location: [facility] INVASIVE CV LAB;  Service: Cardiovascular;  Laterality: N/A;   TEE WITHOUT CARDIOVERSION N/A 08/18/2021   Procedure: TRANSESOPHAGEAL ECHOCARDIOGRAM (TEE);  Surgeon: Wendall Stade, MD;  Location: [facility] ENDOSCOPY;  Service: Cardiovascular;  Laterality: N/A;   TOTAL KNEE ARTHROPLASTY Left 04/30/2022   Procedure: LEFT TOTAL KNEE ARTHROPLASTY;  Surgeon: Gean Birchwood, MD;  Location: [facility] ORS;  Service: Orthopedics;  Laterality: Left;   TOTAL KNEE ARTHROPLASTY Right 06/18/2022   Procedure: RIGHT TOTAL KNEE ARTHROPLASTY;  Surgeon: Gean Birchwood, MD;  Location: [facility] ORS;  Service: Orthopedics;  Laterality: Right;   UMBILICAL HERNIA REPAIR  child      Family History  Problem Relation Age of Onset   Cardiomyopathy Mother    Heart attack Father      Social History   Socioeconomic History   Marital status: Married    Spouse name: Not on file   Number of children: Not on file   Years of education: Not on file   Highest education level: Not on file  Occupational History   Not on file  Tobacco Use   Smoking status: Never    Passive exposure: Past (father smoked as child)   Smokeless tobacco: Never  Vaping Use   Vaping status: Never Used  Substance and Sexual Activity   Alcohol use: Never   Drug use: Never   Sexual activity: Yes    Partners: Female  Other Topics Concern   Not on file  Social History Narrative   Not on file   Social Determinants of Health   Financial Resource Strain: Not on file  Food Insecurity: Not on file  Transportation Needs: Not on file  Physical Activity: Not on file  Stress: Not on file  Social Connections: Not on file  Intimate Partner Violence: Not on file     BP 110/68   Pulse 90   Ht 5\' 10"  (1.778 m)   Wt 230 lb (104.3 kg)   SpO2 93%   BMI 33.00 kg/m   Physical Exam:  Well appearing NAD HEENT: Unremarkable Neck:  No JVD, no thyromegally Lymphatics:  No adenopathy Back:  No CVA tenderness Lungs:  Clear HEART:  Regular rate rhythm, no murmurs, no rubs, no clicks Abd:  soft, positive bowel sounds, no organomegally, no rebound, no guarding Ext:  2 plus pulses, no edema, no cyanosis, no clubbing Skin:  No rashes no nodules Neuro:  CN II through XII intact, motor grossly intact  EKG - nsr with atrial pacing and PVC's and RBBB  DEVICE  Normal device function.  See PaceArt for details.   Assess/Plan: VT - continue sotalol. PVC's - continue sotalol. HCM - he will continue his beta blocker. No chest pain. Aortic aneurysm - no change in size. He will obtain a repeat CT scan in a year. He has been concerned about the cost and requests to hold off on another exam for an  additional few months.  Ryan Glass ,MD

## 2023-07-24 ENCOUNTER — Other Ambulatory Visit (HOSPITAL_COMMUNITY): Payer: Self-pay

## 2023-07-24 MED ORDER — ALBUTEROL SULFATE HFA 108 (90 BASE) MCG/ACT IN AERS
2.0000 | INHALATION_SPRAY | RESPIRATORY_TRACT | 1 refills | Status: DC | PRN
  Filled 2023-07-30: qty 6.7, 17d supply, fill #0

## 2023-07-24 MED ORDER — ALLOPURINOL 100 MG PO TABS
100.0000 mg | ORAL_TABLET | Freq: Every day | ORAL | 3 refills | Status: DC
  Filled 2023-07-24 – 2023-12-11 (×3): qty 90, 90d supply, fill #0

## 2023-07-24 MED ORDER — GABAPENTIN 300 MG PO CAPS
300.0000 mg | ORAL_CAPSULE | Freq: Three times a day (TID) | ORAL | 3 refills | Status: DC
  Filled 2023-08-15: qty 90, 30d supply, fill #0
  Filled 2023-09-01 – 2023-09-08 (×2): qty 90, 30d supply, fill #1

## 2023-07-24 MED ORDER — SOTALOL HCL 120 MG PO TABS
120.0000 mg | ORAL_TABLET | Freq: Two times a day (BID) | ORAL | 1 refills | Status: DC
  Filled 2023-08-15 – 2023-10-28 (×4): qty 90, 45d supply, fill #0

## 2023-07-29 ENCOUNTER — Other Ambulatory Visit: Payer: Self-pay

## 2023-07-29 ENCOUNTER — Other Ambulatory Visit (HOSPITAL_COMMUNITY): Payer: Self-pay

## 2023-07-29 DIAGNOSIS — I7121 Aneurysm of the ascending aorta, without rupture: Secondary | ICD-10-CM

## 2023-07-29 NOTE — Progress Notes (Signed)
Orders placed for Echo to be scheduled ~Sept 2025

## 2023-07-30 ENCOUNTER — Other Ambulatory Visit (HOSPITAL_COMMUNITY): Payer: Self-pay

## 2023-07-30 ENCOUNTER — Other Ambulatory Visit: Payer: Self-pay

## 2023-07-30 ENCOUNTER — Other Ambulatory Visit: Payer: Self-pay | Admitting: Registered Nurse

## 2023-07-30 ENCOUNTER — Other Ambulatory Visit (HOSPITAL_BASED_OUTPATIENT_CLINIC_OR_DEPARTMENT_OTHER): Payer: Self-pay | Admitting: Cardiology

## 2023-07-30 DIAGNOSIS — Z8639 Personal history of other endocrine, nutritional and metabolic disease: Secondary | ICD-10-CM

## 2023-07-30 MED ORDER — VITAMIN D3 50 MCG (2000 UT) PO CAPS
2000.0000 [IU] | ORAL_CAPSULE | Freq: Every day | ORAL | 11 refills | Status: AC
Start: 2023-07-30 — End: 2024-07-29
  Filled 2023-07-30 – 2023-08-15 (×2): qty 30, 30d supply, fill #0

## 2023-07-30 MED ORDER — POTASSIUM CHLORIDE CRYS ER 20 MEQ PO TBCR: 20.0000 meq | EXTENDED_RELEASE_TABLET | Freq: Every day | ORAL | 0 refills | Status: DC | PRN

## 2023-07-30 MED ORDER — FUROSEMIDE 40 MG PO TABS: 40.0000 mg | ORAL_TABLET | Freq: Every day | ORAL | 3 refills | Status: DC | PRN

## 2023-07-30 MED FILL — Spironolactone Tab 25 MG: ORAL | 30 days supply | Qty: 30 | Fill #3 | Status: CN

## 2023-07-30 MED FILL — Apixaban Tab 5 MG: ORAL | 90 days supply | Qty: 180 | Fill #0 | Status: CN

## 2023-07-30 NOTE — Telephone Encounter (Signed)
Latest Reference Range & Units 12/18/21 12:44 03/22/22 09:00 10/01/22 10:55  Vitamin D, 25-Hydroxy 30.0 - 100.0 ng/mL 24.4 (L) 23.1 (L) 46.6  (L): Data is abnormally low  Patient last saw cardiology 05/24/23 continue medications furosemide 40mg  po daily prn 3 lb weight gain daily, leg swelling or 5 lb weight gain in a week #90 RF3 dispensed 90 day supply from [facility] to patient today.   Latest Reference Range & Units 10/01/22 10:55 01/09/23 09:20 03/18/23 10:35  Sodium 134 - 144 mmol/L 135 140 142  Potassium 3.5 - 5.2 mmol/L 4.7 4.4 3.6  Chloride 96 - 106 mmol/L 98 105 106  CO2 20 - 29 mmol/L  19 (L) 16 (L)  Glucose 70 - 99 mg/dL 91 64 (L) 403 (H)  BUN 8 - 27 mg/dL 17 18 22   Creatinine 0.76 - 1.27 mg/dL 4.74 (H) 2.59 5.63  Calcium 8.6 - 10.2 mg/dL 9.2 9.1 9.7  BUN/Creatinine Ratio 10 - 24  13 16 18   eGFR >59 mL/min/1.73 62 71 64  Phosphorus 2.8 - 4.1 mg/dL 3.2    Magnesium 1.6 - 2.3 mg/dL 1.9    Alkaline Phosphatase 44 - 121 IU/L 108    Albumin 3.9 - 4.9 g/dL 4.1    Albumin/Globulin Ratio 1.2 - 2.2  2.0    Uric Acid 3.8 - 8.4 mg/dL 6.7    AST 0 - 40 IU/L 24    ALT 0 - 44 IU/L 24    Total Protein 6.0 - 8.5 g/dL 6.2    Total Bilirubin 0.0 - 1.2 mg/dL 0.5    GGT 0 - 65 IU/L 28    (L): Data is abnormally low (H): Data is abnormally high

## 2023-07-30 NOTE — Telephone Encounter (Signed)
Patient also requested potassium po daily prn take with furosemide from [facility] #90 RF0 dispensed to patient today.

## 2023-08-08 ENCOUNTER — Other Ambulatory Visit: Payer: Self-pay | Admitting: Pulmonary Disease

## 2023-08-15 ENCOUNTER — Encounter: Payer: Self-pay | Admitting: Registered Nurse

## 2023-08-15 ENCOUNTER — Other Ambulatory Visit (HOSPITAL_COMMUNITY): Payer: Self-pay

## 2023-08-15 ENCOUNTER — Other Ambulatory Visit: Payer: Self-pay | Admitting: Registered Nurse

## 2023-08-15 ENCOUNTER — Other Ambulatory Visit: Payer: Self-pay

## 2023-08-15 DIAGNOSIS — J301 Allergic rhinitis due to pollen: Secondary | ICD-10-CM

## 2023-08-15 DIAGNOSIS — I5022 Chronic systolic (congestive) heart failure: Secondary | ICD-10-CM

## 2023-08-15 MED ORDER — SALINE SPRAY 0.65 % NA SOLN
2.0000 | NASAL | 12 refills | Status: DC
  Filled 2023-08-15: qty 44, 10d supply, fill #0
  Filled 2023-09-01: qty 88, 10d supply, fill #0

## 2023-08-15 MED FILL — Albuterol Sulfate Inhal Aero 108 MCG/ACT (90MCG Base Equiv): RESPIRATORY_TRACT | 25 days supply | Qty: 6.7 | Fill #0 | Status: AC

## 2023-08-15 MED FILL — Apixaban Tab 5 MG: ORAL | 90 days supply | Qty: 180 | Fill #0 | Status: CN

## 2023-08-15 MED FILL — Spironolactone Tab 25 MG: ORAL | 30 days supply | Qty: 30 | Fill #3 | Status: CN

## 2023-08-15 NOTE — Telephone Encounter (Signed)
Patient notified to come to clinic for evaluation today if needing albuterol.  Recommend home covid test/spo2 monitoring also.  Fall ragweed season has started and may be flaring symptoms at this time also.

## 2023-08-15 NOTE — Telephone Encounter (Signed)
Seasonal allergic rhinitis.  Clinic supply out of stock.  Rx sent to his pharmacy per patient request #1 RF11 may substitute on hand size/formulary available.  Ragweed bloom has started in local ditches.

## 2023-08-16 ENCOUNTER — Other Ambulatory Visit (HOSPITAL_COMMUNITY): Payer: Self-pay

## 2023-08-20 ENCOUNTER — Ambulatory Visit: Payer: No Typology Code available for payment source | Admitting: Registered Nurse

## 2023-08-20 ENCOUNTER — Other Ambulatory Visit: Payer: Self-pay | Admitting: Registered Nurse

## 2023-08-20 ENCOUNTER — Encounter: Payer: Self-pay | Admitting: Registered Nurse

## 2023-08-20 DIAGNOSIS — M79672 Pain in left foot: Secondary | ICD-10-CM

## 2023-08-20 DIAGNOSIS — M0609 Rheumatoid arthritis without rheumatoid factor, multiple sites: Secondary | ICD-10-CM

## 2023-08-20 DIAGNOSIS — M5136 Other intervertebral disc degeneration, lumbar region: Secondary | ICD-10-CM | POA: Insufficient documentation

## 2023-08-20 NOTE — Patient Instructions (Addendum)
Osteoarthritis  Osteoarthritis is a type of arthritis. It refers to joint pain or joint disease. Osteoarthritis affects tissue that covers the ends of bones in joints (cartilage). Cartilage acts as a cushion between the bones and helps them move smoothly. Osteoarthritis occurs when cartilage in the joints gets worn down. Osteoarthritis is sometimes called "wear and tear" arthritis. Osteoarthritis is the most common form of arthritis. It often occurs in older people. It is a condition that gets worse over time. The joints most often affected by this condition are in the fingers, toes, hips, knees, and spine, including the neck and lower back. What are the causes? This condition is caused by the wearing down of cartilage that covers the ends of bones. What increases the risk? The following factors may make you more likely to develop this condition: Being age 71 or older. Obesity. Overuse of joints. Past injury of a joint. Past surgery on a joint. Family history of osteoarthritis. What are the signs or symptoms? The main symptoms of this condition are pain, swelling, and stiffness in the joint. Other symptoms may include: An enlarged joint. More pain and further damage caused by small pieces of bone or cartilage that break off and float inside of the joint. Small deposits of bone (osteophytes) that grow on the edges of the joint. A grating or scraping feeling inside the joint when you move it. Popping or creaking sounds when you move. Difficulty walking or exercising. An inability to grip items, twist your hand, or control the movements of your hands and fingers. How is this diagnosed? This condition may be diagnosed based on: Your medical history. A physical exam. Your symptoms. X-rays of the affected joints. Blood tests to rule out other types of arthritis. How is this treated? There is no cure for this condition, but treatment can help control pain and improve joint function. Treatment  may include a combination of therapies, such as: Pain relief techniques, such as: Applying heat and cold to the joint. Massage. A form of talk therapy called cognitive behavioral therapy (CBT). This therapy helps you set goals and follow up on the changes that you make. Medicines for pain and inflammation. The medicines can be taken by mouth or applied to the skin. They include: NSAIDs, such as ibuprofen. Prescription medicines. Strong anti-inflammatory medicines (corticosteroids). Certain nutritional supplements. A prescribed exercise program. You may work with a physical therapist. Assistive devices, such as a brace, wrap, splint, specialized glove, or cane. A weight control plan. Surgery, such as: An osteotomy. This is done to reposition the bones and relieve pain or to remove loose pieces of bone and cartilage. Joint replacement surgery. You may need this surgery if you have advanced osteoarthritis. Follow these instructions at home: Activity Rest your affected joints as told by your health care provider. Exercise as told by your provider. The provider may recommend specific types of exercise, such as: Strengthening exercises. These are done to strengthen the muscles that support joints affected by arthritis. Aerobic activities. These are exercises, such as brisk walking or water aerobics, that increase your heart rate. Range-of-motion activities. These help your joints move more easily. Balance and agility exercises. Managing pain, stiffness, and swelling     If told, apply heat to the affected area as often as told by your provider. Use the heat source that your provider recommends, such as a moist heat pack or a heating pad. If you have a removable assistive device, remove it as told by your provider. Place a  towel between your skin and the heat source. If your provider tells you to keep the assistive device on while you apply heat, place a towel between the assistive device and  the heat source. Leave the heat on for 20-30 minutes. If told, put ice on the affected area. If you have a removable assistive device, remove it as told by your provider. Put ice in a plastic bag. Place a towel between your skin and the bag. If your provider tells you to keep the assistive device on during icing, place a towel between the assistive device and the bag. Leave the ice on for 20 minutes, 2-3 times a day. If your skin turns bright red, remove the ice or heat right away to prevent skin damage. The risk of damage is higher if you cannot feel pain, heat, or cold. Move your fingers or toes often to reduce stiffness and swelling. Raise (elevate) the affected area above the level of your heart while you are sitting or lying down. General instructions Take over-the-counter and prescription medicines only as told by your provider. Maintain a healthy weight. Follow instructions from your provider for weight control. Do not use any products that contain nicotine or tobacco. These products include cigarettes, chewing tobacco, and vaping devices, such as e-cigarettes. If you need help quitting, ask your provider. Use assistive devices as told by your provider. Where to find more information [employer] of Arthritis and Musculoskeletal and Skin Diseases: niams.http://www.myers.net/ [employer] on Aging: BaseRingTones.pl American College of Rheumatology: rheumatology.org Contact a health care provider if: You have redness, swelling, or a feeling of warmth in a joint that gets worse. You have a fever along with joint or muscle aches. You develop a rash. You have trouble doing your normal activities. You have pain that gets worse and is not relieved by pain medicine. This information is not intended to replace advice given to you by your health care provider. Make sure you discuss any questions you have with your health care provider. Document Revised: 07/26/2022 Document Reviewed:  07/26/2022 Elsevier Patient Education  2024 [employer] Arthritis Arthritis is a term that is commonly used to refer to joint pain or joint disease. There are more than 100 types of arthritis. What are the causes? The most common cause of this condition is wear and tear of a joint. Other causes include: Gout. Inflammation of a joint. An infection of a joint. Sprains and other injuries near the joint. A reaction to medicines or drugs, or an allergic reaction. In some cases, the cause may not be known. What are the signs or symptoms? The main symptom of this condition is pain in the joint during movement. Other symptoms include: Redness, swelling, or stiffness at a joint. Warmth coming from the joint. Fever. Overall feeling of illness. How is this diagnosed? This condition may be diagnosed with a physical exam and tests, including: Blood tests. Urine tests. Imaging tests, such as X-rays, an MRI, or a CT scan. Sometimes, fluid is removed from a joint for testing. How is this treated? This condition may be treated with: Treatment of the cause, if it is known. Rest. Raising (elevating) the joint. Applying cold or hot packs to the joint. Medicines to improve symptoms and reduce inflammation. Injections of a steroid, such as cortisone, into the joint to help reduce pain and inflammation. Depending on the cause of your arthritis, you may need to make lifestyle changes to reduce stress on your joint. Changes may include: Exercising more. Losing  weight. Follow these instructions at home: Medicines Take over-the-counter and prescription medicines only as told by your health care provider. Do not take aspirin to relieve pain if your health care provider thinks that gout may be causing your pain. Activity Rest your joint if told by your health care provider. Rest is important when your disease is active and your joint feels painful, swollen, or stiff. Avoid activities that make the  pain worse. Balance activity with rest. Exercise your joint regularly with range-of-motion exercises as told by your health care provider. Try doing low-impact exercise, such as: Swimming. Water aerobics. Biking. Walking. Managing pain, stiffness, and swelling     If directed, put ice on the affected joint. To do this: Put ice in a plastic bag. Place a towel between your skin and the bag. Leave the ice on for 20 minutes, 2-3 times a day. Remove the ice if your skin turns bright red. This is very important. If you cannot feel pain, heat, or cold, you have a greater risk of damage to the area. If your joint is swollen, raise (elevate) it above the level of your heart if directed by your health care provider. If your joint feels stiff in the morning, try taking a warm shower. If directed, apply heat to the affected area as often as told by your health care provider. Use the heat source that your health care provider recommends, such as a moist heat pack or a heating pad. To apply heat: Place a towel between your skin and the heat source. Leave the heat on for 20-30 minutes. Remove the heat if your skin turns bright red. This is especially important if you are unable to feel pain, heat, or cold. You have a greater risk of getting burned. General instructions Maintain a healthy weight. Follow instructions from your health care provider for weight control. Do not use any products that contain nicotine or tobacco. These products include cigarettes, chewing tobacco, and vaping devices, such as e-cigarettes. If you need help quitting, ask your health care provider. Keep all follow-up visits. This is important. Where to find more information [employer]: www.niams.http://www.myers.net/ Contact a health care provider if: The pain gets worse. You have a fever. Get help right away if: You develop severe joint pain, swelling, or redness. Many joints become painful and swollen. You develop  severe back pain. You develop severe weakness in your leg. Summary Arthritis is a term that is commonly used to refer to joint pain or joint disease. There are more than 100 types of arthritis. The most common cause of this condition is wear and tear of a joint. Other causes include gout, inflammation or infection of the joint, sprains, or allergies. Symptoms of this condition include redness, swelling, or stiffness of the joint. Other symptoms include warmth, fever, or feeling ill. This condition is treated with rest, elevation, medicines, and applying cold or hot packs. Follow your health care provider's instructions about medicines, activity, exercises, and other home care treatments. This information is not intended to replace advice given to you by your health care provider. Make sure you discuss any questions you have with your health care provider. Document Revised: 09/05/2021 Document Reviewed: 09/05/2021 Elsevier Patient Education  2024 [employer] Inc[employer]  Low-Purine Eating Plan A low-purine eating plan involves making food choices to limit your purine intake. Purine is a kind of uric acid. Too much uric acid in your blood can cause certain conditions, such as gout and kidney stones. Eating a  low-purine diet may help control these conditions. What are tips for following this plan? Shopping Avoid buying products that contain high-fructose corn syrup. Check for this on food labels. It is commonly found in many processed foods and soft drinks. Be sure to check for it in baked goods such as cookies, canned fruits, and cereals and cereal bars. Avoid buying veal, chicken breast with skin, lamb, and organ meats such as liver. These types of meats tend to have the highest purine content. Choose dairy products. These may lower uric acid levels. Avoid certain types of fish. Not all fish and seafood have high purine content. Examples with high purine content include anchovies, trout, tuna, sardines, and  salmon. Avoid buying beverages that contain alcohol, particularly beer and hard liquor. Alcohol can affect the way your body gets rid of uric acid. Meal planning  Learn which foods do or do not affect you. If you find out that a food tends to cause your gout symptoms to flare up, avoid eating that food. You can enjoy foods that do not cause problems. If you have any questions about a food item, talk with your dietitian or health care provider. Reduce the overall amount of meat in your diet. When you do eat meat, choose ones with lower purine content. Include plenty of fruits and vegetables. Although some vegetables may have a high purine content--such as asparagus, mushrooms, spinach, or cauliflower--it has been shown that these do not contribute to uric acid blood levels as much. Consume at least 1 dairy serving a day. This has been shown to decrease uric acid levels. General information If you drink alcohol: Limit how much you have to: 0-1 drink a day for women who are not pregnant. 0-2 drinks a day for men. Know how much alcohol is in a drink. In the [place] one drink equals one 12 oz bottle of beer (355 mL), one 5 oz glass of wine (148 mL), or one 1 oz glass of hard liquor (44 mL). Drink plenty of water. Try to drink enough to keep your urine pale yellow. Fluids can help remove uric acid from your body. Work with your health care provider and dietitian to develop a plan to achieve or maintain a healthy weight. Losing weight may help reduce uric acid in your blood. What foods are recommended? The following are some types of foods that are good choices when limiting purine intake: Fresh or frozen fruits and vegetables. Whole grains, breads, cereals, and pasta. Rice. Beans, peas, legumes. Nuts and seeds. Dairy products. Fats and oils. The items listed above may not be a complete list. Talk with a dietitian about what dietary choices are best for you. What foods are not recommended? Limit  your intake of foods high in purines, including: Beer and other alcohol. Meat-based gravy or sauce. Canned or fresh fish, such as: Anchovies, sardines, herring, salmon, and tuna. Mussels and scallops. Codfish, trout, and haddock. Bacon, veal, chicken breast with skin, and lamb. Organ meats, such as: Liver or kidney. Tripe. Sweetbreads (thymus gland or pancreas). Wild Education officer, environmental. Yeast or yeast extract supplements. Drinks sweetened with high-fructose corn syrup, such as soda. Processed foods made with high-fructose corn syrup. The items listed above may not be a complete list of foods and beverages you should limit. Contact a dietitian for more information. Summary Eating a low-purine diet may help control conditions caused by too much uric acid in the body, such as gout or kidney stones. Choose low-purine foods, limit alcohol, and  limit high-fructose corn syrup. You will learn over time which foods do or do not affect you. If you find out that a food tends to cause your gout symptoms to flare up, avoid eating that food. This information is not intended to replace advice given to you by your health care provider. Make sure you discuss any questions you have with your health care provider. Document Revised: 11/09/2021 Document Reviewed: 11/09/2021 Elsevier Patient Education  2024 [employer] Inc. Gout  Gout is a condition that causes painful swelling of the joints. Gout is a type of inflammation of the joints (arthritis). This condition is caused by having too much uric acid in the body. Uric acid is a chemical that forms when the body breaks down substances called purines. Purines are important for building body proteins. When the body has too much uric acid, sharp crystals can form and build up inside the joints. This causes pain and swelling. Gout attacks can happen quickly and may be very painful (acute gout). Over time, the attacks can affect more joints and become more frequent  (chronic gout). Gout can also cause uric acid to build up under the skin and inside the kidneys. What are the causes? This condition is caused by too much uric acid in your blood. This can happen because: Your kidneys do not remove enough uric acid from your blood. This is the most common cause. Your body makes too much uric acid. This can happen with some cancers and cancer treatments. It can also occur if your body is breaking down too many red blood cells (hemolytic anemia). You eat too many foods that are high in purines. These foods include organ meats and some seafood. Alcohol, especially beer, is also high in purines. A gout attack may be triggered by trauma or stress. What increases the risk? The following factors may make you more likely to develop this condition: Having a family history of gout. Being male and middle-aged. Being male and having gone through menopause. Taking certain medicines, including aspirin, cyclosporine, diuretics, levodopa, and niacin. Having an organ transplant. Having certain conditions, such as: Being obese. Lead poisoning. Kidney disease. A skin condition called psoriasis. Other factors include: Losing weight too quickly. Being dehydrated. Frequently drinking alcohol, especially beer. Frequently drinking beverages that are sweetened with a type of sugar called fructose. What are the signs or symptoms? An attack of acute gout happens quickly. It usually occurs in just one joint. The most common place is the big toe. Attacks often start at night. Other joints that may be affected include joints of the feet, ankle, knee, fingers, wrist, or elbow. Symptoms of this condition may include: Severe pain. Warmth. Swelling. Stiffness. Tenderness. The affected joint may be very painful to touch. Shiny, red, or purple skin. Chills and fever. Chronic gout may cause symptoms more frequently. More joints may be involved. You may also have white or yellow lumps  (tophi) on your hands or feet or in other areas near your joints. How is this diagnosed? This condition is diagnosed based on your symptoms, your medical history, and a physical exam. You may have tests, such as: Blood tests to measure uric acid levels. Removal of joint fluid with a thin needle (aspiration) to look for uric acid crystals. X-rays to look for joint damage. How is this treated? Treatment for this condition has two phases: treating an acute attack and preventing future attacks. Acute gout treatment may include medicines to reduce pain and swelling, including: NSAIDs, such as ibuprofen.  Steroids. These are strong anti-inflammatory medicines that can be taken by mouth (orally) or injected into a joint. Colchicine. This medicine relieves pain and swelling when it is taken soon after an attack. It can be given by mouth or through an IV. Preventive treatment may include: Daily use of smaller doses of NSAIDs or colchicine. Use of a medicine that reduces uric acid levels in your blood, such as allopurinol. Changes to your diet. You may need to see a dietitian about what to eat and drink to prevent gout. Follow these instructions at home: During a gout attack  If directed, put ice on the affected area. To do this: Put ice in a plastic bag. Place a towel between your skin and the bag. Leave the ice on for 20 minutes, 2-3 times a day. Remove the ice if your skin turns bright red. This is very important. If you cannot feel pain, heat, or cold, you have a greater risk of damage to the area. Raise (elevate) the affected joint above the level of your heart as often as possible. Rest the joint as much as possible. If the affected joint is in your leg, you may be given crutches to use. Follow instructions from your health care provider about eating or drinking restrictions. Avoiding future gout attacks Follow a low-purine diet as told by your dietitian or health care provider. Avoid foods and  drinks that are high in purines, including liver, kidney, anchovies, asparagus, herring, mushrooms, mussels, and beer. Maintain a healthy weight or lose weight if you are overweight. If you want to lose weight, talk with your health care provider. Do not lose weight too quickly. Start or maintain an exercise program as told by your health care provider. Eating and drinking Avoid drinking beverages that contain fructose. Drink enough fluids to keep your urine pale yellow. If you drink alcohol: Limit how much you have to: 0-1 drink a day for women who are not pregnant. 0-2 drinks a day for men. Know how much alcohol is in a drink. In the [place] one drink equals one 12 oz bottle of beer (355 mL), one 5 oz glass of wine (148 mL), or one 1 oz glass of hard liquor (44 mL). General instructions Take over-the-counter and prescription medicines only as told by your health care provider. Ask your health care provider if the medicine prescribed to you requires you to avoid driving or using machinery. Return to your normal activities as told by your health care provider. Ask your health care provider what activities are safe for you. Keep all follow-up visits. This is important. Where to find more information [employer]: www.niams.http://www.myers.net/ Contact a health care provider if you have: Another gout attack. Continuing symptoms of a gout attack after 10 days of treatment. Side effects from your medicines. Chills or a fever. Burning pain when you urinate. Pain in your lower back or abdomen. Get help right away if you: Have severe or uncontrolled pain. Cannot urinate. Summary Gout is painful swelling of the joints caused by having too much uric acid in the body. The most common site for gout to occur is in the big toe, but it can affect other joints in the body. Medicines and dietary changes can help to prevent and treat gout attacks. This information is not intended to replace advice  given to you by your health care provider. Make sure you discuss any questions you have with your health care provider. Document Revised: 08/30/2021 Document Reviewed: 08/30/2021 Elsevier  Patient Education  2024 [employer].

## 2023-08-20 NOTE — Progress Notes (Signed)
Subjective:    Patient ID: Ryan Glass, male    DOB: [birth year], 67 y.o.   MRN: [id number]  67y/o [place]ian married male here for magnesium lab draw and magnesium refill.  Has been on supplement since low level when hospitalized   Patient has also had some worsening of back pain with cooler weather.  Left foot pain without gout flare noted e.g. redness/swelling/tenderness to palpation.  Wondering if uric acid level elevated.  Denied loss of bowel/bladder control, saddle paresthesias or arm/leg weakness.  Finger joints also having worsening pain and swelling with weather temperature changes.  Denied injury/discharge/redness/fever/chills.  Taking his medication as prescribed.  Has tried exercises and heat fingers and back stiff and most painful with use in am upon awakening.  Back improves through the day but finger joints not improving with use/arom/heat. Taking his prednisone 5mg  daily and orencia weekly.   Patient wondering if he should see another specialist.  Established with rheumatology.  Patient also asking if anything he can due for lower eyelid edema wondering if due to allergy to dogs.  Has not had any allergy testing in the past.  Has been sleeping better since oxygen tank connected to his cpap and he has noticed on home intermittent sp02 monitoring oxygen level staying above 90% now also.  Established with cardiology and pulmonology.      Review of Systems  Constitutional:  Negative for chills, diaphoresis and fever.  HENT:  Positive for congestion, facial swelling, postnasal drip and rhinorrhea. Negative for nosebleeds, sinus pressure, sinus pain, trouble swallowing and voice change.   Eyes:  Negative for photophobia and visual disturbance.  Respiratory:  Negative for cough, choking, chest tightness, shortness of breath, wheezing and stridor.   Cardiovascular:  Negative for chest pain.  Gastrointestinal:  Negative for diarrhea, nausea and vomiting.  Genitourinary:  Negative for  difficulty urinating.  Musculoskeletal:  Positive for arthralgias, back pain, joint swelling and myalgias. Negative for gait problem.  Skin:  Negative for color change, rash and wound.  Allergic/Immunologic: Positive for environmental allergies.  Neurological:  Negative for dizziness, tremors, seizures, syncope, facial asymmetry, speech difficulty, weakness, light-headedness, numbness and headaches.  Hematological:  Negative for adenopathy.  Psychiatric/Behavioral:  Negative for agitation, confusion and sleep disturbance.        Objective:   Physical Exam Vitals and nursing note reviewed.  Constitutional:      General: He is awake. He is not in acute distress.    Appearance: Normal appearance. He is well-developed and well-groomed. He is obese. He is not ill-appearing, toxic-appearing or diaphoretic.  HENT:     Head: Normocephalic and atraumatic.     Jaw: There is normal jaw occlusion.     Salivary Glands: Right salivary gland is not diffusely enlarged. Left salivary gland is not diffusely enlarged.     Right Ear: Hearing and external ear normal.     Left Ear: Hearing and external ear normal.     Nose: Nose normal. No congestion or rhinorrhea.     Mouth/Throat:     Lips: Pink. No lesions.     Mouth: Mucous membranes are moist.     Pharynx: Oropharynx is clear.  Eyes:     General: Lids are normal. Vision grossly intact. Gaze aligned appropriately. Allergic shiner present. No scleral icterus.       Right eye: No discharge.        Left eye: No discharge.     Extraocular Movements: Extraocular movements intact.  Conjunctiva/sclera: Conjunctivae normal.     Pupils: Pupils are equal, round, and reactive to light.     Comments: Lower eyelids 1-2+/4 bilateral nonpitting edema  Neck:     Trachea: Trachea normal.  Cardiovascular:     Rate and Rhythm: Normal rate and regular rhythm.     Pulses: Normal pulses.  Pulmonary:     Effort: Pulmonary effort is normal.     Breath sounds:  Normal breath sounds and air entry. No stridor or transmitted upper airway sounds. No wheezing.     Comments: Spoke full sentences without difficulty; no cough observed in exam room Abdominal:     Palpations: Abdomen is soft.  Musculoskeletal:        General: Swelling and deformity present. No signs of injury.     Right elbow: No swelling, effusion or lacerations. No tenderness.     Left elbow: Deformity present. No swelling, effusion or lacerations. No tenderness.     Right hand: Swelling and deformity present. No lacerations. Decreased range of motion. Normal strength. Normal capillary refill.     Left hand: Swelling and deformity present. No lacerations. Decreased range of motion. Normal strength. Normal capillary refill.     Cervical back: Normal range of motion and neck supple. No swelling, edema, erythema, signs of trauma, lacerations, rigidity, spasms, torticollis or crepitus.     Thoracic back: No swelling, edema, deformity, signs of trauma, lacerations or tenderness. Decreased range of motion. No scoliosis.     Lumbar back: No swelling, edema, deformity, signs of trauma, lacerations or spasms. Decreased range of motion.     Right lower leg: No edema.     Left lower leg: No edema.     Right ankle: No swelling, deformity, ecchymosis or lacerations.     Left ankle: No swelling, deformity, ecchymosis or lacerations.     Comments: Tophus left elbow; MCP/PIP joints fingers bilateral enlarged and arom decreased flexion  Lymphadenopathy:     Head:     Right side of head: No submandibular or preauricular adenopathy.     Left side of head: No submandibular or preauricular adenopathy.     Cervical:     Right cervical: No superficial cervical adenopathy.    Left cervical: No superficial cervical adenopathy.  Skin:    General: Skin is warm and dry.     Capillary Refill: Capillary refill takes less than 2 seconds.     Coloration: Skin is not ashen, cyanotic, jaundiced, mottled, pale or sallow.      Findings: No abrasion, abscess, acne, bruising, burn, ecchymosis, erythema, signs of injury, laceration, lesion, petechiae, rash or wound.     Nails: There is no clubbing.  Neurological:     General: No focal deficit present.     Mental Status: He is alert and oriented to person, place, and time. Mental status is at baseline.     GCS: GCS eye subscore is 4. GCS verbal subscore is 5. GCS motor subscore is 6.     Cranial Nerves: No cranial nerve deficit, dysarthria or facial asymmetry.     Motor: Motor function is intact. No weakness, tremor, atrophy, abnormal muscle tone or seizure activity.     Coordination: Coordination is intact. Coordination normal.     Gait: Gait is intact. Gait normal.     Comments: In/out of chair without difficulty; gait sure and steady in clinic; bilateral hand grasp equal 5/5  Psychiatric:        Attention and Perception: Attention and perception normal.  Mood and Affect: Mood and affect normal.        Speech: Speech normal.        Behavior: Behavior normal. Behavior is cooperative.        Thought Content: Thought content normal.        Cognition and Memory: Cognition and memory normal.        Judgment: Judgment normal.        Latest Reference Range & Units 10/12/21 02:57 12/18/21 12:44 06/22/22 08:42 06/23/22 04:00 06/24/22 03:41 06/25/22 04:03 06/26/22 04:31 06/27/22 04:38 10/01/22 10:55 03/20/23 16:19  Magnesium 1.6 - 2.3 mg/dL 2.0 2.4 (H) 2.0 2.1 2.2 2.2 2.3 2.5 (H) 1.9 2.3  (H): Data is abnormally high  Anatomical Region Laterality Modality  L-spine -- Digital Radiography   Narrative  CLINICAL DATA:  Low back pain  EXAM: LUMBAR SPINE - 2-3 VIEW  COMPARISON:  None.  FINDINGS: 5 nonrib bearing lumbar-type vertebral bodies.  Vertebral body heights are maintained. No acute fracture. Generalized osteopenia. Severe levoscoliosis of the lumbar spine centered at L2.  Grade 1 anterolisthesis of L4 on L5 and L5 on S1. No  spondylolysis.  Severe degenerative disease with disc height loss at L2-3. Degenerative disease with disc height loss at T12-L1, L1-2, L4-5 and L5-S1. Bilateral facet arthropathy throughout the lumbar spine most severe at L4-5 and L5-S1.  SI joints are unremarkable.  Abdominal aortic atherosclerosis.  IMPRESSION: 1. Lumbar spine spondylosis as described above. 2. Severe levoscoliosis of the lumbar spine centered at L2.   Electronically Signed   By: Elige Ko   On: 08/22/2020 08:16 Procedure Note  Janeth Rase, MD - 08/22/2020 Formatting of this note might be different from the original. CLINICAL DATA:  Low back pain  EXAM: LUMBAR SPINE - 2-3 VIEW  COMPARISON:  None.  FINDINGS: 5 nonrib bearing lumbar-type vertebral bodies.  Vertebral body heights are maintained. No acute fracture. Generalized osteopenia. Severe levoscoliosis of the lumbar spine centered at L2.  Grade 1 anterolisthesis of L4 on L5 and L5 on S1. No spondylolysis.  Severe degenerative disease with disc height loss at L2-3. Degenerative disease with disc height loss at T12-L1, L1-2, L4-5 and L5-S1. Bilateral facet arthropathy throughout the lumbar spine most severe at L4-5 and L5-S1.  SI joints are unremarkable.  Abdominal aortic atherosclerosis.  IMPRESSION: 1. Lumbar spine spondylosis as described above. 2. Severe levoscoliosis of the lumbar spine centered at L2.   Electronically Signed   By: Elige Ko   On: 08/22/2020 08:16 Exam End: 08/19/20 16:19   Specimen Collected: 08/22/20 08:10 Last Resulted: 08/22/20 08:16  Received From: [facility] visits prior to 02/09/2023.  Result Received: 11/28/22 20:18      Assessment & Plan:   A-DDD T/L-spine, acute left foot pain, history hypomagnesemia  P-discussed low purine diet,   avoiding dehydration uric acid level today nonfasting sent to [facility].  Continue allopurinol 100mg  and prednisone 5mg  po daily.  Consider  NSAID of his choice with food to help with pain also.  Exitcare handouts on gout and lower purine diet.  Patient verbalized understanding information/instructions and had no further questions at this time.  Discussed heat and avoiding prolonged static positions when not sleeping to help with DDD/DJD spine.  Patient established with rheumatology continue appts as scheduled for rheumatoid arthritis bilateral hands.  Continue orencia and prednisone use.Patient verbalized understanding information/instructions and had no further questions at this time.  Nonfasting magnesium level today sent to [facility] will sent my chart  message when results received tomorrow and Rx to his pharmacy of choice if indicated.  Patient on furosemide, has been taking magensium oxide 400mg  po every other day.  Denied diarrhea/constipation/muscle cramps/spasms/paresthesias/insomnia.  Patient verbalized understanding information/instructions and had no further questions at this time.

## 2023-08-21 ENCOUNTER — Other Ambulatory Visit (HOSPITAL_COMMUNITY): Payer: Self-pay

## 2023-08-21 LAB — MAGNESIUM: Magnesium: 1.9 mg/dL (ref 1.6–2.3)

## 2023-08-21 LAB — URIC ACID: Uric Acid: 7.6 mg/dL (ref 3.8–8.4)

## 2023-08-21 MED ORDER — MAGNESIUM OXIDE 400 MG PO TABS
400.0000 mg | ORAL_TABLET | ORAL | 3 refills | Status: DC
  Filled 2023-08-21 – 2024-03-26 (×3): qty 90, 180d supply, fill #0
  Filled 2024-07-16: qty 90, 180d supply, fill #1

## 2023-08-21 NOTE — Telephone Encounter (Signed)
Haydn, Your magnesium level was normal continue magnesium 1 tab every other day new Rx sent to your pharmacy  Your uric acid level was normal but increased from last October.  Ensure you are taking your allopurinol every day and since feel like a gout attack coming follow low purine diet handout was sent to your my chart yesterday.  Reminder if you do develop gout attack to start your colchine, push water and notify PCM/me as adjusting your prednisone dose may also help with symptoms since you are not to take NSAIDS/ibuprofen/advil/aleve due to history of GI bleed. Please let me know if you have further questions or concerns. Sincerely, Albina Billet NP-C  Hypomagnesemia side effect of furosemide use supplementation keeping patient level stable

## 2023-08-21 NOTE — Telephone Encounter (Signed)
Ryan Glass, Your magnesium level was normal continue magnesium 1 tab every other day new Rx sent to your pharmacy  Your uric acid level was normal but increased from last October.  Ensure you are taking your allopurinol every day and since feel like a gout attack coming follow low purine diet handout was sent to your my chart yesterday.  Reminder if you do develop gout attack to start your colchine, push water and notify PCM/me as adjusting your prednisone dose may also help with symptoms since you are not to take NSAIDS/ibuprofen/advil/aleve due to history of GI bleed. Please let me know if you have further questions or concerns. Sincerely, Albina Billet NP-C   Hypomagnesemia side effect of furosemide use supplementation keeping patient level stable  see tcon dated 08/15/23 refill placed for patient magnesium oxide 400mg  po every other day #90 RF3 electronic sent to his pharmacy of choice

## 2023-08-26 ENCOUNTER — Ambulatory Visit (INDEPENDENT_AMBULATORY_CARE_PROVIDER_SITE_OTHER): Payer: No Typology Code available for payment source

## 2023-08-26 DIAGNOSIS — Z4502 Encounter for adjustment and management of automatic implantable cardiac defibrillator: Secondary | ICD-10-CM

## 2023-08-26 DIAGNOSIS — I422 Other hypertrophic cardiomyopathy: Secondary | ICD-10-CM

## 2023-08-26 DIAGNOSIS — I472 Ventricular tachycardia, unspecified: Secondary | ICD-10-CM | POA: Diagnosis not present

## 2023-08-26 LAB — CUP PACEART REMOTE DEVICE CHECK
Battery Remaining Longevity: 58 mo
Battery Remaining Percentage: 70 %
Battery Voltage: 2.96 V
Brady Statistic AP VP Percent: 79 %
Brady Statistic AP VS Percent: 21 %
Brady Statistic AS VP Percent: 1 %
Brady Statistic AS VS Percent: 1 %
Brady Statistic RA Percent Paced: 98 %
Brady Statistic RV Percent Paced: 79 %
Date Time Interrogation Session: 20240916020104
HighPow Impedance: 61 Ohm
Implantable Lead Connection Status: 753985
Implantable Lead Connection Status: 753985
Implantable Lead Implant Date: 20220829
Implantable Lead Implant Date: 20220829
Implantable Lead Location: 753859
Implantable Lead Location: 753860
Implantable Pulse Generator Implant Date: 20220829
Lead Channel Impedance Value: 350 Ohm
Lead Channel Impedance Value: 450 Ohm
Lead Channel Pacing Threshold Amplitude: 0.75 V
Lead Channel Pacing Threshold Amplitude: 1.25 V
Lead Channel Pacing Threshold Pulse Width: 0.5 ms
Lead Channel Pacing Threshold Pulse Width: 0.5 ms
Lead Channel Sensing Intrinsic Amplitude: 12 mV
Lead Channel Sensing Intrinsic Amplitude: 3 mV
Lead Channel Setting Pacing Amplitude: 2 V
Lead Channel Setting Pacing Amplitude: 2.5 V
Lead Channel Setting Pacing Pulse Width: 0.5 ms
Lead Channel Setting Sensing Sensitivity: 0.5 mV
Pulse Gen Serial Number: 810029878

## 2023-09-01 ENCOUNTER — Other Ambulatory Visit: Payer: Self-pay | Admitting: Registered Nurse

## 2023-09-01 DIAGNOSIS — E876 Hypokalemia: Secondary | ICD-10-CM

## 2023-09-01 MED FILL — Spironolactone Tab 25 MG: ORAL | 30 days supply | Qty: 30 | Fill #3 | Status: AC

## 2023-09-01 MED FILL — Apixaban Tab 5 MG: ORAL | 90 days supply | Qty: 180 | Fill #0 | Status: CN

## 2023-09-01 MED FILL — Albuterol Sulfate Inhal Aero 108 MCG/ACT (90MCG Base Equiv): RESPIRATORY_TRACT | 25 days supply | Qty: 6.7 | Fill #1 | Status: CN

## 2023-09-02 ENCOUNTER — Other Ambulatory Visit: Payer: Self-pay

## 2023-09-02 ENCOUNTER — Other Ambulatory Visit (HOSPITAL_COMMUNITY): Payer: Self-pay

## 2023-09-03 ENCOUNTER — Encounter: Payer: Self-pay | Admitting: Registered Nurse

## 2023-09-08 MED FILL — Apixaban Tab 5 MG: ORAL | 90 days supply | Qty: 180 | Fill #0 | Status: AC

## 2023-09-09 ENCOUNTER — Other Ambulatory Visit (HOSPITAL_COMMUNITY): Payer: Self-pay

## 2023-09-09 ENCOUNTER — Other Ambulatory Visit: Payer: Self-pay

## 2023-09-09 NOTE — Progress Notes (Signed)
Remote ICD transmission.   

## 2023-09-15 ENCOUNTER — Other Ambulatory Visit (HOSPITAL_COMMUNITY): Payer: Self-pay

## 2023-09-16 ENCOUNTER — Other Ambulatory Visit: Payer: Self-pay

## 2023-09-16 ENCOUNTER — Other Ambulatory Visit (HOSPITAL_COMMUNITY): Payer: Self-pay

## 2023-09-17 ENCOUNTER — Other Ambulatory Visit (HOSPITAL_COMMUNITY): Payer: Self-pay

## 2023-09-17 MED ORDER — MONTELUKAST SODIUM 10 MG PO TABS
10.0000 mg | ORAL_TABLET | Freq: Every day | ORAL | 3 refills | Status: DC
  Filled 2023-09-17 – 2023-10-31 (×3): qty 90, 90d supply, fill #0
  Filled 2023-11-17 – 2024-01-25 (×4): qty 90, 90d supply, fill #1
  Filled 2024-04-13: qty 90, 90d supply, fill #2
  Filled 2024-08-04 – 2024-08-05 (×2): qty 90, 90d supply, fill #3

## 2023-09-17 MED ORDER — POTASSIUM CHLORIDE CRYS ER 20 MEQ PO TBCR: 20.0000 meq | EXTENDED_RELEASE_TABLET | Freq: Every day | ORAL | Status: DC | PRN

## 2023-09-18 ENCOUNTER — Other Ambulatory Visit: Payer: Self-pay | Admitting: Registered Nurse

## 2023-09-18 DIAGNOSIS — E7849 Other hyperlipidemia: Secondary | ICD-10-CM

## 2023-09-19 ENCOUNTER — Encounter: Payer: Self-pay | Admitting: Registered Nurse

## 2023-09-19 MED ORDER — ATORVASTATIN CALCIUM 40 MG PO TABS: 40.0000 mg | ORAL_TABLET | Freq: Every day | ORAL | 0 refills | Status: DC

## 2023-09-19 NOTE — Telephone Encounter (Signed)
Last filled 03/19/2023 90 tabs from [facility] atorvastatin 40mg  po daily  last labs    Latest Reference Range & Units 10/01/22 10:55  Total CHOL/HDL Ratio 0.0 - 5.0 ratio 3.0  Cholesterol, Total 100 - 199 mg/dL 308  HDL Cholesterol >65 mg/dL 39 (L)  Triglycerides 0 - 149 mg/dL 88  VLDL Cholesterol Cal 5 - 40 mg/dL 17  LDL Chol Calc (NIH) 0 - 99 mg/dL 60  (L): Data is abnormally low   Latest Reference Range & Units 10/01/22 10:55 01/09/23 09:20 03/18/23 10:35 03/20/23 16:19 08/20/23 10:59  Sodium 134 - 144 mmol/L 135 140 142    Potassium 3.5 - 5.2 mmol/L 4.7 4.4 3.6    Chloride 96 - 106 mmol/L 98 105 106    CO2 20 - 29 mmol/L  19 (L) 16 (L)    Glucose 70 - 99 mg/dL 91 64 (L) 784 (H)    BUN 8 - 27 mg/dL 17 18 22     Creatinine 0.76 - 1.27 mg/dL 6.96 (H) 2.95 2.84    Calcium 8.6 - 10.2 mg/dL 9.2 9.1 9.7    BUN/Creatinine Ratio 10 - 24  13 16 18     eGFR >59 mL/min/1.73 62 71 64    Phosphorus 2.8 - 4.1 mg/dL 3.2      Magnesium 1.6 - 2.3 mg/dL 1.9   2.3 1.9  Alkaline Phosphatase 44 - 121 IU/L 108      Albumin 3.9 - 4.9 g/dL 4.1      Albumin/Globulin Ratio 1.2 - 2.2  2.0      Uric Acid 3.8 - 8.4 mg/dL 6.7    7.6  AST 0 - 40 IU/L 24      ALT 0 - 44 IU/L 24      Total Protein 6.0 - 8.5 g/dL 6.2      Total Bilirubin 0.0 - 1.2 mg/dL 0.5      GGT 0 - 65 IU/L 28      (L): Data is abnormally low (H): Data is abnormally high  Care everywhere labs Component Ref Range & Units 9 mo ago Comments  Sodium 135 - 146 MMOL/L 139   Potassium 3.5 - 5.3 MMOL/L 3.7   Chloride 98 - 110 MMOL/L 105   CO2 23 - 30 MMOL/L 22 Low    BUN 8 - 24 MG/DL 15   Glucose 70 - 99 MG/DL 132 High  Patients taking eltrombopag at doses >/= 100 mg daily may show falsely elevated values of 10% or greater.  Creatinine 0.50 - 1.50 MG/DL 4.40   Calcium 8.5 - 10.2 MG/DL 9.9   Total Protein 6.0 - 8.3 G/DL 6.8 Patients taking eltrombopag at doses >/= 100 mg daily may show falsely elevated values of 10% or greater.   Albumin 3.5 - 5.0 G/DL 4.5   Total Bilirubin 0.1 - 1.2 MG/DL 0.6 Patients taking eltrombopag at doses >/= 100 mg daily may show falsely elevated values of 10% or greater.  Alkaline Phosphatase 25 - 125 IU/L or U/L 93   AST (SGOT) 5 - 40 IU/L or U/L 29   ALT (SGPT) 5 - 50 IU/L or U/L 29   Anion Gap 4 - 14 MMOL/L 12   Est. GFR >=60 ML/MIN/1.73 M*2 56 Low  GFR estimated by CKD-EPI equations(NKF 2021).  "Recommend confirmation of Cr-based eGFR by using Cys-based eGFR and other filtration markers (if applicable) in complex cases and clinical decision-making, as needed."  Resulting Agency [facility]  Specimen Collected: 12/18/22 16:58   Performed by: Keturah Barre [facility] Last Resulted: 12/18/22 18:14  Received From: [facility] visits prior to 02/09/2023.  Result Received: 12/31/22 12:00    Saw cardiology 07/23/23  and  01/17/23; next appt 11/25/23 labs due 2025  Dispensed 90 tabs atorvastatin 40mg  to patient today.

## 2023-09-26 ENCOUNTER — Other Ambulatory Visit (HOSPITAL_COMMUNITY): Payer: Self-pay

## 2023-10-09 ENCOUNTER — Ambulatory Visit: Payer: No Typology Code available for payment source

## 2023-10-09 DIAGNOSIS — Z23 Encounter for immunization: Secondary | ICD-10-CM

## 2023-10-20 ENCOUNTER — Other Ambulatory Visit: Payer: Self-pay | Admitting: Cardiology

## 2023-10-20 DIAGNOSIS — I480 Paroxysmal atrial fibrillation: Secondary | ICD-10-CM

## 2023-10-20 MED FILL — Spironolactone Tab 25 MG: ORAL | 30 days supply | Qty: 30 | Fill #4 | Status: AC

## 2023-10-21 ENCOUNTER — Other Ambulatory Visit: Payer: Self-pay

## 2023-10-21 ENCOUNTER — Other Ambulatory Visit (HOSPITAL_COMMUNITY): Payer: Self-pay

## 2023-10-21 MED ORDER — APIXABAN 5 MG PO TABS
5.0000 mg | ORAL_TABLET | Freq: Two times a day (BID) | ORAL | 1 refills | Status: DC
  Filled 2023-10-21 – 2023-11-17 (×3): qty 180, 90d supply, fill #0
  Filled 2023-12-11 – 2024-02-22 (×3): qty 180, 90d supply, fill #1

## 2023-10-21 NOTE — Telephone Encounter (Signed)
Prescription refill request for Eliquis received. Indication:afib Last office visit:8/24 Scr:1.24  4/24 Age: 67 Weight:104.3  kg  Prescription refilled

## 2023-10-28 ENCOUNTER — Other Ambulatory Visit (HOSPITAL_COMMUNITY): Payer: Self-pay

## 2023-10-28 ENCOUNTER — Other Ambulatory Visit: Payer: Self-pay

## 2023-10-29 ENCOUNTER — Other Ambulatory Visit (HOSPITAL_COMMUNITY): Payer: Self-pay

## 2023-10-29 ENCOUNTER — Encounter: Payer: Self-pay | Admitting: Registered Nurse

## 2023-10-29 ENCOUNTER — Ambulatory Visit: Payer: No Typology Code available for payment source | Admitting: Registered Nurse

## 2023-10-29 VITALS — HR 46 | Temp 98.7°F | Resp 18

## 2023-10-29 DIAGNOSIS — M7918 Myalgia, other site: Secondary | ICD-10-CM

## 2023-10-29 DIAGNOSIS — J301 Allergic rhinitis due to pollen: Secondary | ICD-10-CM

## 2023-10-29 NOTE — Patient Instructions (Signed)
Muscle Pain, Adult Muscle pain, also called myalgia, is a condition in which a person has pain in one or more muscles in the body. The pain may be mild, moderate, or severe. It may feel sharp, achy, or burning. In most cases, the pain lasts only a short time and goes away on its own. It is normal to feel some muscle pain after you start a new exercise program. Muscles that have not been used a lot will be sore at first. What are the causes? You may have muscle pain when you use your muscles in a new or different way after not having used them for some time. Muscle pain can also be caused by overuse or by stretching a muscle beyond its normal length (muscle strain). You may be more likely to have muscle pain if you are not in shape. Other causes may include: Injury or bruising. Infectious diseases. These include diseases caused by viruses, such as the flu (influenza). Fibromyalgia. This is a long-term (chronic) condition that causes muscle tenderness, tiredness (fatigue), and headache. Autoimmune or rheumatologic diseases. These are conditions, such as lupus, that cause the body's defense system (immune system) to attack areas in the body. Certain medicines. These include ACE inhibitors and statins. What are the signs or symptoms? The main symptom is sore or painful muscles. Your muscles may be sore when you do activities and when you stretch. You may also have slight swelling. How is this diagnosed? Muscle pain is diagnosed with a physical exam. Your health care provider will ask questions about your pain and when it began. If you have not had muscle pain for very long, your provider may want to wait before doing much testing. If your muscle pain has lasted a long time, tests may be done right away. In some cases, you may need tests to rule out other conditions and diseases. How is this treated? Treatment for muscle pain depends on the cause. Home care is usually enough to relieve the pain. Your  provider may also prescribe NSAIDs, such as ibuprofen. Follow these instructions at home: Medicines Take over-the-counter and prescription medicines only as told by your provider. Ask your health care provider if the medicine prescribed to you requires you to avoid driving or using machinery. Managing pain, swelling, and discomfort     If told, put ice on the painful area for the first 2 days of soreness. Put ice in a plastic bag. Place a towel between your skin and the bag. Leave the ice on for 20 minutes, 2-3 times a day. If your skin turns bright red, remove the ice right away to prevent skin damage. The risk of damage is higher if you cannot feel pain, heat, or cold. For the first 2 days of muscle soreness, or if there is swelling: Do not soak in hot baths. Do not use a hot tub, steam room, sauna, heating pad, or other heat source. After 2-3 days, you may switch between putting ice and heat on the area. If told, apply heat to the affected area as often as told by your provider. Use the heat source that your recommends, such as a moist heat pack or a heating pad. Place a towel between your skin and the heat source. Leave the heat on for 20-30 minutes. If your skin turns bright red, remove the ice or heat right away to prevent skin damage. The risk of damage is higher if you cannot feel pain, heat, or cold. If you have an injury,  raise (elevate) the injured area above the level of your heart while you are sitting or lying down. Activity  If your muscle pain is caused by overuse: Slow down your activities until the pain goes away. Do regular, gentle exercises if you are not normally active. Warm up before you exercise. Stretch before and after you exercise. This can help lower the risk of muscle pain. Do not keep working out if the pain is severe. Severe pain could mean that you have injured a muscle. You may have to avoid lifting. Ask your provider how much you can safely lift. Return  to your normal activities as told by your provider. Ask your provider what activities are safe for you. General instructions Do not use any products that contain nicotine or tobacco. These products include cigarettes, chewing tobacco, and vaping devices, such as e-cigarettes. If you need help quitting, ask your provider. Contact a health care provider if: Your muscle pain gets worse, and medicines do not help. The muscle pain lasts longer than 3 days. You have a rash or fever. You have muscle pain after a tick bite. You have muscle pain when you work out, even though you are in good shape. You have redness, soreness, or swelling. You have muscle pain after you start a new medicine or change the dose of a medicine. Get help right away if: You have trouble breathing. You have trouble swallowing. You have muscle pain along with a stiff neck, fever, and vomiting. You have severe muscle weakness, or you cannot move part of your body. You are urinating less, or you have dark, bloody, or discolored urine. You have redness or swelling at the site of the muscle pain. These symptoms may be an emergency. Get help right away. Call 911. Do not wait to see if the symptoms will go away. Do not drive yourself to the hospital. This information is not intended to replace advice given to you by your health care provider. Make sure you discuss any questions you have with your health care provider. Document Revised: 07/06/2022 Document Reviewed: 07/06/2022 Elsevier Patient Education  2024 [employer] Viral Illness, Adult Viruses are tiny germs that can get into a person's body and cause illness. There are many different types of viruses. And they cause many types of illness. Viral illnesses can range from mild to severe. They can affect various parts of the body. Short-term conditions that are caused by a virus include colds and flu (influenza) and stomach viruses. Long-term conditions that are caused by a  virus include herpes, shingles, and human immunodeficiency virus (HIV) infection. A few viruses have been linked to certain cancers. What are the causes? Many types of viruses can cause illness. Viruses get into cells in your body, multiply, and cause the infected cells to work differently or die. When these cells die, they release more of the virus. When this happens, you get symptoms of the illness and the virus spreads to other cells. If the virus takes over how the cell works, it can cause the cell to divide and grow out of control. This happens when a virus causes cancer. Different viruses get into the body in different ways. You can get a virus by: Swallowing food or water that has come in contact with the virus. Breathing in droplets that have been coughed or sneezed into the air by an infected person. Touching a surface that has the virus on it and then touching your eyes, nose, or mouth. Being bitten by  an insect or animal that carries the virus. Having sexual contact with a person who is infected with the virus. Being exposed to blood or fluids that contain the virus, either through an open cut or during a transfusion. If a virus enters your body, your body's disease-fighting system (immune system) will try to fight the virus. You may be at higher risk for a viral illness if your immune system is weak. What are the signs or symptoms? Symptoms depend on the type of virus and the location of the cells that it gets into. Symptoms can include: For cold and flu viruses: Fever. Headache. Sore throat. Muscle aches. Stuffy nose (nasal congestion). Cough. For stomach (gastrointestinal) viruses: Fever. Pain in the abdomen. Nausea or vomiting. Diarrhea. For liver viruses (hepatitis): Loss of appetite. Feeling tired. Skin or the white parts of your eyes turning yellow (jaundice). For brain and spinal cord viruses: Fever. Headache. Stiff neck. Nausea and vomiting. Confusion or being  sleepy. For skin viruses: Warts. Itching. Rash. For sexually transmitted viruses: Discharge. Swelling. Redness. Rash. How is this diagnosed? This condition may be diagnosed based on one or more of these: Your symptoms and medical history. A physical exam. Tests, such as: Blood tests. Tests on a sample of mucus from your lungs (sputum sample). Tests on a poop (stool) sample. Tests on a swab of body fluids or a skin sore (lesion). How is this treated? Viruses can be hard to treat because they live within cells. Antibiotics do not treat viruses because these medicines do not get inside cells. Treatment for a viral illness may include: Resting and drinking a lot of fluids. Medicines to treat symptoms. These can include over-the-counter medicine for pain and fever, medicines for cough or congestion, and medicines for diarrhea. Antiviral medicines. These medicines are available only for certain types of viruses. Some viral illnesses can be prevented with vaccinations. A common example is the flu shot. Follow these instructions at home: Medicines Take over-the-counter and prescription medicines only as told by your health care provider. If you were prescribed an antiviral medicine, take it as told by your provider. Do not stop taking the antiviral even if you start to feel better. Know when antibiotics are needed and when they are not needed. Antibiotics do not treat viruses. You may get an antibiotic if your provider thinks that you may have, or are at risk for, a bacterial infection and you have a viral infection. Do not ask for an antibiotic prescription if you have been diagnosed with a viral illness. Antibiotics will not make your illness go away faster. Taking antibiotics when they are not needed can lead to antibiotic resistance. When this develops, the medicine no longer works against the bacteria that it normally fights. General instructions Drink enough fluids to keep your pee  (urine) pale yellow. Rest as much as possible. Return to your normal activities as told by your provider. Ask your provider what activities are safe for you. How is this prevented? To lower your risk of getting another viral illness: Wash your hands often with soap and water for at least 20 seconds. If soap and water are not available, use hand sanitizer. Avoid touching your nose, eyes, and mouth, especially if you have not washed your hands recently. If anyone in your household has a viral infection, clean all household surfaces that may have been in contact with the virus. Use soap and hot water. You may also use a commercially prepared, bleach-containing solution. Stay away from people who  are sick with symptoms of a viral infection. Do not share items such as toothbrushes and water bottles with other people. Keep your vaccinations up to date. This includes getting a yearly flu shot. Eat a healthy diet and get plenty of rest. Contact a health care provider if: You have symptoms of a viral illness that do not go away. Your symptoms come back after going away. Your symptoms get worse. Get help right away if: You have trouble breathing. You have a severe headache or a stiff neck. You have severe vomiting or pain in your abdomen. These symptoms may be an emergency. Get help right away. Call 911. Do not wait to see if the symptoms will go away. Do not drive yourself to the hospital. This information is not intended to replace advice given to you by your health care provider. Make sure you discuss any questions you have with your health care provider. Document Revised: 12/12/2022 Document Reviewed: 09/26/2022 Elsevier Patient Education  2024 [employer].

## 2023-10-29 NOTE — Progress Notes (Signed)
Subjective:    Patient ID: Ryan Glass, male    DOB: [birth year], 67 y.o.   MRN: [id number]  67y/o married [place] male established here for evaluation body aches, nasal congestion for the past couple of days has not covid tested.  Had traveled to [place] this month and returned to work last week.  Denied known sick contacts/f/c/n/v/d/rash/dyspnea/dysphagia/dysphasia/cough/chest pain/dizzyness/headache.     Review of Systems  Constitutional:  Negative for chills, diaphoresis, fatigue and fever.  HENT:  Positive for congestion, ear pain and postnasal drip. Negative for dental problem, ear discharge, facial swelling, hearing loss, mouth sores, nosebleeds, rhinorrhea, sinus pressure, sinus pain, sneezing, sore throat, tinnitus, trouble swallowing and voice change.   Eyes:  Negative for photophobia, discharge, redness, itching and visual disturbance.  Respiratory:  Negative for cough, choking, chest tightness, shortness of breath, wheezing and stridor.   Cardiovascular:  Negative for chest pain, palpitations and leg swelling.  Gastrointestinal:  Negative for abdominal distention, abdominal pain, diarrhea, nausea and vomiting.  Genitourinary:  Negative for difficulty urinating.  Musculoskeletal:  Positive for arthralgias and myalgias. Negative for back pain, gait problem, joint swelling, neck pain and neck stiffness.  Skin:  Negative for color change, pallor, rash and wound.  Allergic/Immunologic: Positive for environmental allergies.  Neurological:  Negative for dizziness, tremors, seizures, syncope, facial asymmetry, speech difficulty, weakness, light-headedness, numbness and headaches.  Psychiatric/Behavioral:  Negative for agitation, confusion and sleep disturbance.        Objective:   Physical Exam Vitals and nursing note reviewed.  Constitutional:      General: He is not in acute distress.    Appearance: Normal appearance. He is well-developed and well-groomed. He is obese. He is  not ill-appearing, toxic-appearing or diaphoretic.  HENT:     Head: Normocephalic and atraumatic.     Jaw: There is normal jaw occlusion. No trismus.     Salivary Glands: Right salivary gland is not diffusely enlarged or tender. Left salivary gland is not diffusely enlarged or tender.     Right Ear: Hearing, ear canal and external ear normal. No decreased hearing noted. No laceration, drainage, swelling or tenderness. A middle ear effusion is present. There is no impacted cerumen. No foreign body. No mastoid tenderness. No PE tube. No hemotympanum. Tympanic membrane is not injected, scarred, perforated, erythematous, retracted or bulging.     Left Ear: Hearing, ear canal and external ear normal. No decreased hearing noted. No laceration, drainage, swelling or tenderness. A middle ear effusion is present. There is no impacted cerumen. No foreign body. No mastoid tenderness. No PE tube. No hemotympanum. Tympanic membrane is not injected, scarred, perforated, erythematous, retracted or bulging.     Ears:     Comments: Bilateral TMs intact air fluid level clear no debris noted in auditory canals    Nose: Mucosal edema, congestion and rhinorrhea present. No nasal deformity, septal deviation or laceration. Rhinorrhea is clear.     Right Sinus: No maxillary sinus tenderness or frontal sinus tenderness.     Left Sinus: No maxillary sinus tenderness or frontal sinus tenderness.     Mouth/Throat:     Lips: Pink. No lesions.     Mouth: Mucous membranes are moist. Mucous membranes are not pale, not dry and not cyanotic. No lacerations, oral lesions or angioedema.     Dentition: No dental abscesses or gum lesions.     Tongue: No lesions. Tongue does not deviate from midline.     Palate: No mass and lesions.  Pharynx: Uvula midline. Pharyngeal swelling, posterior oropharyngeal erythema and postnasal drip present. No oropharyngeal exudate or uvula swelling.     Tonsils: No tonsillar exudate or tonsillar  abscesses.     Comments: Clear discharge nasal turbinates; bilateral allergic shiners; nasal congestion mild auditory Eyes:     General: Lids are normal. Vision grossly intact. Gaze aligned appropriately. Allergic shiner present. No scleral icterus.       Right eye: No foreign body, discharge or hordeolum.        Left eye: No foreign body, discharge or hordeolum.     Extraocular Movements: Extraocular movements intact.     Right eye: Normal extraocular motion and no nystagmus.     Left eye: Normal extraocular motion and no nystagmus.     Conjunctiva/sclera: Conjunctivae normal.     Right eye: Right conjunctiva is not injected. No chemosis, exudate or hemorrhage.    Left eye: Left conjunctiva is not injected. No chemosis, exudate or hemorrhage.    Pupils: Pupils are equal, round, and reactive to light. Pupils are equal.     Right eye: Pupil is round and reactive.     Left eye: Pupil is round and reactive.  Neck:     Thyroid: No thyroid mass or thyromegaly.     Trachea: Trachea and phonation normal. No tracheal tenderness or tracheal deviation.  Cardiovascular:     Rate and Rhythm: Regular rhythm. Bradycardia present.     Pulses: Normal pulses.          Radial pulses are 2+ on the right side and 2+ on the left side.     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 No respiratory distress.     Breath sounds: Normal breath sounds and air entry. No stridor or transmitted upper airway sounds. No decreased breath sounds, wheezing, rhonchi or rales.     Comments: Spoke full sentences without difficulty; no cough observed in exam room Abdominal:     General: There is no distension.     Palpations: Abdomen is soft.  Musculoskeletal:        General: No swelling or signs of injury.     Right hand: Normal strength. Normal capillary refill.     Left hand: Normal strength. Normal capillary refill.      Cervical back: Normal range of motion and neck supple. No swelling, edema, deformity, erythema, signs of trauma, lacerations, rigidity, spasms, torticollis, tenderness or crepitus. No pain with movement, spinous process tenderness or muscular tenderness. Normal range of motion.     Thoracic back: No swelling, edema, signs of trauma, lacerations, spasms or tenderness.     Lumbar back: No swelling, edema, signs of trauma, lacerations or spasms.     Right lower leg: No edema.     Left lower leg: No edema.     Comments: Patient at baseline AROM arms/legs/neck/back  Lymphadenopathy:     Head:     Right side of head: No submental, submandibular, tonsillar, preauricular, posterior auricular or occipital adenopathy.     Left side of head: No submental, submandibular, tonsillar, preauricular, posterior auricular or occipital adenopathy.     Cervical: No cervical adenopathy.     Right cervical: No superficial, deep or posterior cervical adenopathy.    Left cervical: No superficial, deep or posterior cervical adenopathy.  Skin:    General: Skin is warm and dry.     Capillary  Refill: Capillary refill takes less than 2 seconds.     Coloration: Skin is not ashen, cyanotic, jaundiced, mottled, pale or sallow.     Findings: No abrasion, abscess, acne, bruising, burn, ecchymosis, erythema, signs of injury, laceration, lesion, petechiae, rash or wound.     Nails: There is no clubbing.  Neurological:     General: No focal deficit present.     Mental Status: He is alert and oriented to person, place, and time. Mental status is at baseline.     GCS: GCS eye subscore is 4. GCS verbal subscore is 5. GCS motor subscore is 6.     Cranial Nerves: No cranial nerve deficit, dysarthria or facial asymmetry.     Sensory: No sensory deficit.     Motor: Motor function is intact. No weakness, tremor, atrophy, abnormal muscle tone or seizure activity.     Coordination: Coordination is intact. Coordination normal.      Gait: Gait is intact. Gait normal.     Comments: Gait sure and steady in clinic; in/out of chair and on/off exam table without difficulty; bilateral hand grasp equal  Psychiatric:        Attention and Perception: Attention and perception normal.        Mood and Affect: Mood and affect normal.        Speech: Speech normal.        Behavior: Behavior normal. Behavior is cooperative.        Thought Content: Thought content normal.        Cognition and Memory: Cognition and memory normal.        Judgment: Judgment normal.    Home covid test negative      Assessment & Plan:   A-myalgias and allergic rhinitis  P-discussed with patient could be viral illness, colder temperatures flaring osteoarthritis after being in warming weather [place] for vacation x 2 weeks.  Continue taking his medications for pain/rheumatology.  Consider hot shower/epsom salt bath.  [employer] [place] govt  covid test given to patient to complete prior to returning to workstation.  VSS/exam WNL/baseline respirations even and unlabored RA.  Hydrate with water as he has noticed urine a little darker than his usual.  Avoid high purine diet as history of gout.  No rashes noted today arms/hands/neck/face/ankles.  Notify NP if new or worsening symptoms and consider repeat covid test as it is circulating in community along with other viruses.  Exitcare handout myalgias and viral illness.  Patient agreed with plan of care and had no further questions at this time.

## 2023-10-30 ENCOUNTER — Other Ambulatory Visit (HOSPITAL_COMMUNITY): Payer: Self-pay

## 2023-10-30 MED ORDER — COLCHICINE 0.6 MG PO TABS
0.6000 mg | ORAL_TABLET | Freq: Two times a day (BID) | ORAL | 1 refills | Status: DC | PRN
  Filled 2023-10-30: qty 60, 30d supply, fill #0
  Filled 2023-11-17 – 2023-11-21 (×3): qty 60, 30d supply, fill #1

## 2023-10-30 MED ORDER — COLCHICINE 0.6 MG PO TABS
0.6000 mg | ORAL_TABLET | Freq: Two times a day (BID) | ORAL | 1 refills | Status: DC | PRN
  Filled 2023-10-30: qty 60, 30d supply, fill #0

## 2023-10-31 ENCOUNTER — Encounter: Payer: Self-pay | Admitting: Registered Nurse

## 2023-10-31 ENCOUNTER — Other Ambulatory Visit: Payer: Self-pay

## 2023-10-31 ENCOUNTER — Ambulatory Visit: Payer: No Typology Code available for payment source | Admitting: Registered Nurse

## 2023-10-31 ENCOUNTER — Other Ambulatory Visit (HOSPITAL_COMMUNITY): Payer: Self-pay

## 2023-10-31 VITALS — BP 129/88 | HR 87 | Temp 98.0°F | Resp 18

## 2023-10-31 DIAGNOSIS — M10071 Idiopathic gout, right ankle and foot: Secondary | ICD-10-CM

## 2023-10-31 DIAGNOSIS — R42 Dizziness and giddiness: Secondary | ICD-10-CM

## 2023-10-31 MED ORDER — PREDNISONE 10 MG PO TABS
10.0000 mg | ORAL_TABLET | Freq: Every day | ORAL | Status: AC
Start: 2023-10-31 — End: 2023-11-04

## 2023-10-31 NOTE — Progress Notes (Signed)
Subjective:    Patient ID: Ryan Glass, male    DOB: 1956/11/01, 67 y.o.   MRN: [id number]  67y/o married [place] male here for re-evaluation dizzyness and right foot pain.  I have a new gout flare in right foot/ankle.  Called my doctor and got refill on my colchicine and it is helping some.  Wondering if it is causing me to be dizzy as some worsening since Tuesday.  Denied falls/passing out.  Body aches have improved some.  Denied f/c/n/v/d tolerating po intake without difficulty eating usual foods/water intake per day.  Urinating without difficulty.  Covid test was negative on Tuesday and yesterday.      Review of Systems  Constitutional:  Negative for chills, diaphoresis, fatigue and fever.  HENT:  Negative for congestion, trouble swallowing and voice change.   Eyes:  Negative for photophobia and visual disturbance.  Respiratory:  Negative for cough, choking, chest tightness, shortness of breath, wheezing and stridor.   Cardiovascular:  Negative for chest pain and palpitations.  Gastrointestinal:  Negative for diarrhea, nausea and vomiting.  Genitourinary:  Negative for difficulty urinating.  Musculoskeletal:  Positive for myalgias. Negative for neck pain and neck stiffness.  Skin:  Positive for color change. Negative for pallor and wound.  Allergic/Immunologic: Positive for environmental allergies.  Neurological:  Positive for dizziness. Negative for tremors, seizures, syncope, facial asymmetry, speech difficulty, weakness, light-headedness, numbness and headaches.  Psychiatric/Behavioral:  Negative for agitation, confusion and sleep disturbance.        Objective:   Physical Exam Vitals and nursing note reviewed.  Constitutional:      General: He is awake. He is not in acute distress.    Appearance: Normal appearance. He is well-developed. He is obese. He is not ill-appearing, toxic-appearing or diaphoretic.  HENT:     Head: Normocephalic and atraumatic.     Salivary  Glands: Right salivary gland is not diffusely enlarged or tender. Left salivary gland is not diffusely enlarged or tender.     Right Ear: Hearing, ear canal and external ear normal. No decreased hearing noted. No laceration, drainage, swelling or tenderness. A middle ear effusion is present. There is no impacted cerumen. No foreign body. No mastoid tenderness. No PE tube. No hemotympanum. Tympanic membrane is not injected, scarred, perforated, erythematous, retracted or bulging.     Left Ear: Hearing, ear canal and external ear normal. No decreased hearing noted. No laceration, drainage, swelling or tenderness. A middle ear effusion is present. There is no impacted cerumen. No foreign body. No mastoid tenderness. No PE tube. No hemotympanum. Tympanic membrane is not injected, scarred, perforated, erythematous, retracted or bulging.     Nose: Congestion and rhinorrhea present. Rhinorrhea is clear.     Right Nostril: No epistaxis.     Left Nostril: No epistaxis.     Right Turbinates: Not enlarged, swollen or pale.     Left Turbinates: Not enlarged, swollen or pale.     Right Sinus: No maxillary sinus tenderness or frontal sinus tenderness.     Left Sinus: No maxillary sinus tenderness or frontal sinus tenderness.     Mouth/Throat:     Lips: Pink. No lesions.     Mouth: Mucous membranes are normal. Mucous membranes are moist. No oral lesions or angioedema.     Dentition: Abnormal dentition. Has dentures. No gum lesions.     Tongue: No lesions. Tongue does not deviate from midline.     Palate: No mass and lesions.  Pharynx: Uvula midline. Pharyngeal swelling, posterior oropharyngeal edema, posterior oropharyngeal erythema and postnasal drip present. No oropharyngeal exudate or uvula swelling.     Tonsils: No tonsillar exudate or tonsillar abscesses.     Comments: Bilateral allergic shiners; cobblestoning posterior pharynx and drip from above; bilateral TMs intact air fluid level clear; clear  discharge bilateral nasal turbinates Eyes:     General: Lids are normal. Vision grossly intact. Gaze aligned appropriately. Allergic shiner present. No visual field deficit or scleral icterus.    Extraocular Movements: Extraocular movements intact and EOM normal.     Conjunctiva/sclera: Conjunctivae normal.     Pupils: Pupils are equal, round, and reactive to light.  Neck:     Trachea: Trachea and phonation normal.  Cardiovascular:     Rate and Rhythm: Regular rhythm.     Pulses: Normal pulses.          Radial pulses are 2+ on the right side and 2+ on the left side.     Heart sounds: Normal heart sounds.     Comments: Bradycardia with sitting; normal rate with walking and pulse/sp02 monitoring; sock line at ankles today bilaterally equal Pulmonary:     Effort: Pulmonary effort is normal. No respiratory distress.     Breath sounds: Normal breath sounds and air entry. No stridor, decreased air movement or transmitted upper airway sounds. No decreased breath sounds, wheezing, rhonchi or rales.     Comments: No cough observed in exam room; spoke full sentences without difficulty; RR increases with walking 18-20 but decreases to 16 after sitting 1-2 minutes Abdominal:     General: There is no distension.     Palpations: Abdomen is soft.  Musculoskeletal:        General: Swelling and tenderness present. Normal range of motion.     Right hand: No lacerations. Normal strength. Normal capillary refill.     Left hand: No lacerations. Normal strength. Normal capillary refill.     Cervical back: Normal range of motion and neck supple. No swelling, edema, erythema, signs of trauma, lacerations, rigidity, spasms, torticollis, tenderness or crepitus.     Thoracic back: No swelling, edema, signs of trauma, lacerations or tenderness.     Right lower leg: No lacerations or tenderness. 1+ Edema present.     Left lower leg: No lacerations or tenderness. 1+ Edema present.     Right ankle: Swelling present.  No ecchymosis or lacerations. Tenderness present.     Right Achilles Tendon: No tenderness.     Left ankle: Swelling present. No ecchymosis or lacerations. No tenderness.     Left Achilles Tendon: No tenderness.     Right foot: Normal capillary refill. Swelling and tenderness present.     Left foot: Normal capillary refill. No swelling or tenderness.       Legs:     Comments: Medial right ankle soft tissue TTP mild erythema/mid foot; difficulty to take shoes on/off due to pain today but improved from yesterday per patient after starting colchicine; pitting edema 1+/4 bilateral ankles/sock line today  Lymphadenopathy:     Head:     Right side of head: No submental, preauricular or posterior auricular adenopathy.     Left side of head: No submental, preauricular or posterior auricular adenopathy.     Cervical: No cervical adenopathy.     Right cervical: No superficial, deep or posterior cervical adenopathy.    Left cervical: No superficial, deep or posterior cervical adenopathy.  Skin:    General: Skin is  warm, dry and intact.     Capillary Refill: Capillary refill takes less than 2 seconds.     Coloration: Skin is not ashen, cyanotic, jaundiced, mottled, pale or sallow.     Findings: Erythema and rash present. No abrasion, abscess, acne, bruising, burn, ecchymosis, signs of injury, laceration, lesion, petechiae or wound. Rash is macular. Rash is not crusting, nodular, papular, purpuric, pustular, scaling, urticarial or vesicular.     Nails: There is no clubbing.          Comments: Macular erythema and tenderness medial right foot/ankle dry capillary refill less than 2 seconds; TTP  Neurological:     General: No focal deficit present.     Mental Status: He is alert and oriented to person, place, and time. Mental status is at baseline.     GCS: GCS eye subscore is 4. GCS verbal subscore is 5. GCS motor subscore is 6.     Cranial Nerves: No cranial nerve deficit, dysarthria or facial  asymmetry.     Sensory: Sensation is intact. No sensory deficit.     Motor: Motor function is intact. No weakness, tremor, atrophy, abnormal muscle tone or seizure activity.     Coordination: Coordination is intact. Coordination normal.     Gait: Gait normal.     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        Mood and Affect: Mood and affect, mood and affect normal.        Speech: Speech normal.        Behavior: Behavior normal. Behavior is cooperative.        Thought Content: Thought content normal.        Cognition and Memory: Cognition, memory and cognition and memory normal.        Judgment: Judgment normal.           Assessment & Plan:  A-gout right foot/ankle initial visit; dizziness  P-recommended low purine diet may continue colchicine.  Added prednisone 10mg  po daily x 4 days for patient comfort dispensed 21 tabs from [facility].  Keep follow up with rheumatology/PCM as scheduled..  Avoid dehydration drink water to keep urine pale yellow clear and voiding every 2-4 hours while awake.  Holiday diet last week work celebration not low purine and not drinking his usual amount of water most likely cause of this gout trigger.  Patient has recurrent gout episodes typically in lower extremities.  Exitcare handouts gout and low purine diet.  Patient verbalized understanding information/instructions, agreed with plan of care and had no further questions at this time.  Bradycardia when sitting 40s may need adjustment of his device cardiac. Able to perform all work duties and has not had any syncope/falls. Secure message sent to his cardiology providers.  VSS in clinic today.  Pt to avoid dehydration and take lasix prn weight gain/leg swelling as previously instructed.  Encouraged patient to send my chart  message to his cardiology providers also and notify them of symptoms at scheduled visits in December.  Discussed with patient when heart rate below 60 may not be getting enough blood supply to head and this is symptom of dizziness.  Slow position changes and keep log of pulse at home versus at work when sitting.  He has sp02 monitor at home.  Discussed fluid in  middle ears may be contributing also.  Continue his allergy medication regimen singulair, nasal saline and flonase nasal.  Consider meclizine if room spinning/vertigo occurs.  Covid test earlier this week negative. Exam stable today from Tuesday.  Patient agreed with plan of care and had no further questions at this time.

## 2023-10-31 NOTE — Patient Instructions (Signed)
Dizziness Dizziness is a common problem. It makes you feel unsteady or light-headed. You may feel like you're about to faint. Dizziness can lead to getting hurt if you stumble or fall. It's more common to feel dizzy if you're an older adult. Many things can cause you to feel dizzy. These include: Medicines. Dehydration. This is when there's not enough water in your body. Illness. Follow these instructions at home: Eating and drinking  Drink enough fluid to keep your pee (urine) pale yellow. This helps keep you from getting dehydrated. Try to drink more clear fluids, such as water. Do not drink alcohol. Try to limit how much caffeine you take in. Try to limit how much salt, also called sodium, you take in. Activity Try not to make quick movements. Stand up slowly from sitting in a chair. Steady yourself until you feel okay. In the morning, first sit up on the side of the bed. When you feel okay, hold onto something and slowly stand up. Do this until you know that your balance is okay. If you need to stand in one place for a long time, move your legs often. Tighten and relax the muscles in your legs while you're standing. Do not drive or use machines if you feel dizzy. Avoid bending down if you feel dizzy. Place items in your home so you can reach them without leaning over. Lifestyle Do not smoke, vape, or use products with nicotine or tobacco in them. If you need help quitting, talk with your health care provider. Try to lower your stress level. You can do this by using methods like yoga or meditation. Talk with your provider if you need help. General instructions Watch your dizziness for any changes. Take your medicines only as told by your provider. Talk with your provider if you think you're dizzy because of a medicine you're taking. Tell a friend or a family member that you're feeling dizzy. If they spot any changes in your behavior, have them call your provider. Contact a health care  provider if: Your dizziness doesn't go away, or you have new symptoms. Your dizziness gets worse. You feel like you may vomit. You have trouble hearing. You have a fever. You have neck pain or a stiff neck. You fall or get hurt. Get help right away if: You vomit each time you eat or drink. You have watery poop and can't eat or drink. You have trouble talking, walking, swallowing, or using your arms, hands, or legs. You feel very weak. You're bleeding. You're not thinking clearly, or you have trouble forming sentences. A friend or family member may spot this. Your vision changes, or you get a very bad headache. These symptoms may be an emergency. Call 911 right away. Do not wait to see if the symptoms will go away. Do not drive yourself to the hospital. This information is not intended to replace advice given to you by your health care provider. Make sure you discuss any questions you have with your health care provider. Document Revised: 01/10/2023 Document Reviewed: 01/10/2023 Elsevier Patient Education  2024 [employer] Inc. Low-Purine Eating Plan A low-purine eating plan involves making food choices to limit your purine intake. Purine is a kind of uric acid. Too much uric acid in your blood can cause certain conditions, such as gout and kidney stones. Eating a low-purine diet may help control these conditions. What are tips for following this plan? Shopping Avoid buying products that contain high-fructose corn syrup. Check for this on food  labels. It is commonly found in many processed foods and soft drinks. Be sure to check for it in baked goods such as cookies, canned fruits, and cereals and cereal bars. Avoid buying veal, chicken breast with skin, lamb, and organ meats such as liver. These types of meats tend to have the highest purine content. Choose dairy products. These may lower uric acid levels. Avoid certain types of fish. Not all fish and seafood have high purine content.  Examples with high purine content include anchovies, trout, tuna, sardines, and salmon. Avoid buying beverages that contain alcohol, particularly beer and hard liquor. Alcohol can affect the way your body gets rid of uric acid. Meal planning  Learn which foods do or do not affect you. If you find out that a food tends to cause your gout symptoms to flare up, avoid eating that food. You can enjoy foods that do not cause problems. If you have any questions about a food item, talk with your dietitian or health care provider. Reduce the overall amount of meat in your diet. When you do eat meat, choose ones with lower purine content. Include plenty of fruits and vegetables. Although some vegetables may have a high purine content--such as asparagus, mushrooms, spinach, or cauliflower--it has been shown that these do not contribute to uric acid blood levels as much. Consume at least 1 dairy serving a day. This has been shown to decrease uric acid levels. General information If you drink alcohol: Limit how much you have to: 0-1 drink a day for women who are not pregnant. 0-2 drinks a day for men. Know how much alcohol is in a drink. In the [place] one drink equals one 12 oz bottle of beer (355 mL), one 5 oz glass of wine (148 mL), or one 1 oz glass of hard liquor (44 mL). Drink plenty of water. Try to drink enough to keep your urine pale yellow. Fluids can help remove uric acid from your body. Work with your health care provider and dietitian to develop a plan to achieve or maintain a healthy weight. Losing weight may help reduce uric acid in your blood. What foods are recommended? The following are some types of foods that are good choices when limiting purine intake: Fresh or frozen fruits and vegetables. Whole grains, breads, cereals, and pasta. Rice. Beans, peas, legumes. Nuts and seeds. Dairy products. Fats and oils. The items listed above may not be a complete list. Talk with a dietitian about  what dietary choices are best for you. What foods are not recommended? Limit your intake of foods high in purines, including: Beer and other alcohol. Meat-based gravy or sauce. Canned or fresh fish, such as: Anchovies, sardines, herring, salmon, and tuna. Mussels and scallops. Codfish, trout, and haddock. Bacon, veal, chicken breast with skin, and lamb. Organ meats, such as: Liver or kidney. Tripe. Sweetbreads (thymus gland or pancreas). [employer] officer, environmental. Yeast or yeast extract supplements. Drinks sweetened with high-fructose corn syrup, such as soda. Processed foods made with high-fructose corn syrup. The items listed above may not be a complete list of foods and beverages you should limit. Contact a dietitian for more information. Summary Eating a low-purine diet may help control conditions caused by too much uric acid in the body, such as gout or kidney stones. Choose low-purine foods, limit alcohol, and limit high-fructose corn syrup. You will learn over time which foods do or do not affect you. If you find out that a food tends to cause your  gout symptoms to flare up, avoid eating that food. This information is not intended to replace advice given to you by your health care provider. Make sure you discuss any questions you have with your health care provider. Document Revised: 11/09/2021 Document Reviewed: 11/09/2021 Elsevier Patient Education  2024 Else[employer] Inc. Gout  Gout is a condition that causes painful swelling of the joints. Gout is a type of inflammation of the joints (arthritis). This condition is caused by having too much uric acid in the body. Uric acid is a chemical that forms when the body breaks down substances called purines. Purines are important for building body proteins. When the body has too much uric acid, sharp crystals can form and build up inside the joints. This causes pain and swelling. Gout attacks can happen quickly and may be very painful (acute  gout). Over time, the attacks can affect more joints and become more frequent (chronic gout). Gout can also cause uric acid to build up under the skin and inside the kidneys. What are the causes? This condition is caused by too much uric acid in your blood. This can happen because: Your kidneys do not remove enough uric acid from your blood. This is the most common cause. Your body makes too much uric acid. This can happen with some cancers and cancer treatments. It can also occur if your body is breaking down too many red blood cells (hemolytic anemia). You eat too many foods that are high in purines. These foods include organ meats and some seafood. Alcohol, especially beer, is also high in purines. A gout attack may be triggered by trauma or stress. What increases the risk? The following factors may make you more likely to develop this condition: Having a family history of gout. Being male and middle-aged. Being male and having gone through menopause. Taking certain medicines, including aspirin, cyclosporine, diuretics, levodopa, and niacin. Having an organ transplant. Having certain conditions, such as: Being obese. Lead poisoning. Kidney disease. A skin condition called psoriasis. Other factors include: Losing weight too quickly. Being dehydrated. Frequently drinking alcohol, especially beer. Frequently drinking beverages that are sweetened with a type of sugar called fructose. What are the signs or symptoms? An attack of acute gout happens quickly. It usually occurs in just one joint. The most common place is the big toe. Attacks often start at night. Other joints that may be affected include joints of the feet, ankle, knee, fingers, wrist, or elbow. Symptoms of this condition may include: Severe pain. Warmth. Swelling. Stiffness. Tenderness. The affected joint may be very painful to touch. Shiny, red, or purple skin. Chills and fever. Chronic gout may cause symptoms more  frequently. More joints may be involved. You may also have white or yellow lumps (tophi) on your hands or feet or in other areas near your joints. How is this diagnosed? This condition is diagnosed based on your symptoms, your medical history, and a physical exam. You may have tests, such as: Blood tests to measure uric acid levels. Removal of joint fluid with a thin needle (aspiration) to look for uric acid crystals. X-rays to look for joint damage. How is this treated? Treatment for this condition has two phases: treating an acute attack and preventing future attacks. Acute gout treatment may include medicines to reduce pain and swelling, including: NSAIDs, such as ibuprofen. Steroids. These are strong anti-inflammatory medicines that can be taken by mouth (orally) or injected into a joint. Colchicine. This medicine relieves pain and swelling when it is  taken soon after an attack. It can be given by mouth or through an IV. Preventive treatment may include: Daily use of smaller doses of NSAIDs or colchicine. Use of a medicine that reduces uric acid levels in your blood, such as allopurinol. Changes to your diet. You may need to see a dietitian about what to eat and drink to prevent gout. Follow these instructions at home: During a gout attack  If directed, put ice on the affected area. To do this: Put ice in a plastic bag. Place a towel between your skin and the bag. Leave the ice on for 20 minutes, 2-3 times a day. Remove the ice if your skin turns bright red. This is very important. If you cannot feel pain, heat, or cold, you have a greater risk of damage to the area. Raise (elevate) the affected joint above the level of your heart as often as possible. Rest the joint as much as possible. If the affected joint is in your leg, you may be given crutches to use. Follow instructions from your health care provider about eating or drinking restrictions. Avoiding future gout attacks Follow a  low-purine diet as told by your dietitian or health care provider. Avoid foods and drinks that are high in purines, including liver, kidney, anchovies, asparagus, herring, mushrooms, mussels, and beer. Maintain a healthy weight or lose weight if you are overweight. If you want to lose weight, talk with your health care provider. Do not lose weight too quickly. Start or maintain an exercise program as told by your health care provider. Eating and drinking Avoid drinking beverages that contain fructose. Drink enough fluids to keep your urine pale yellow. If you drink alcohol: Limit how much you have to: 0-1 drink a day for women who are not pregnant. 0-2 drinks a day for men. Know how much alcohol is in a drink. In the [place] one drink equals one 12 oz bottle of beer (355 mL), one 5 oz glass of wine (148 mL), or one 1 oz glass of hard liquor (44 mL). General instructions Take over-the-counter and prescription medicines only as told by your health care provider. Ask your health care provider if the medicine prescribed to you requires you to avoid driving or using machinery. Return to your normal activities as told by your health care provider. Ask your health care provider what activities are safe for you. Keep all follow-up visits. This is important. Where to find more information [employer]: [email or web address].myers.[email or web address]/ Contact a health care provider if you have: Another gout attack. Continuing symptoms of a gout attack after 10 days of treatment. Side effects from your medicines. Chills or a fever. Burning pain when you urinate. Pain in your lower back or abdomen. Get help right away if you: Have severe or uncontrolled pain. Cannot urinate. Summary Gout is painful swelling of the joints caused by having too much uric acid in the body. The most common site for gout to occur is in the big toe, but it can affect other joints in the body. Medicines and dietary changes can help to  prevent and treat gout attacks. This information is not intended to replace advice given to you by your health care provider. Make sure you discuss any questions you have with your health care provider. Document Revised: 08/30/2021 Document Reviewed: 08/30/2021 Elsevier Patient Education  2024 [employer].

## 2023-11-06 ENCOUNTER — Other Ambulatory Visit (HOSPITAL_COMMUNITY): Payer: Self-pay

## 2023-11-07 MED FILL — Spironolactone Tab 25 MG: ORAL | 30 days supply | Qty: 30 | Fill #5 | Status: CN

## 2023-11-08 ENCOUNTER — Other Ambulatory Visit (HOSPITAL_COMMUNITY): Payer: Self-pay

## 2023-11-17 ENCOUNTER — Other Ambulatory Visit (HOSPITAL_COMMUNITY): Payer: Self-pay

## 2023-11-17 MED FILL — Spironolactone Tab 25 MG: ORAL | 30 days supply | Qty: 30 | Fill #5 | Status: AC

## 2023-11-18 ENCOUNTER — Other Ambulatory Visit: Payer: Self-pay

## 2023-11-18 ENCOUNTER — Other Ambulatory Visit (HOSPITAL_COMMUNITY): Payer: Self-pay

## 2023-11-18 MED ORDER — GABAPENTIN 300 MG PO CAPS
300.0000 mg | ORAL_CAPSULE | Freq: Three times a day (TID) | ORAL | 3 refills | Status: DC
  Filled 2023-11-18: qty 90, 30d supply, fill #0
  Filled 2023-12-29: qty 90, 30d supply, fill #1
  Filled 2024-02-22: qty 90, 30d supply, fill #2
  Filled 2024-04-13: qty 90, 30d supply, fill #3

## 2023-11-18 MED ORDER — ACETAMINOPHEN-CODEINE 300-30 MG PO TABS
1.0000 | ORAL_TABLET | Freq: Two times a day (BID) | ORAL | 3 refills | Status: DC | PRN
  Filled 2023-11-18: qty 60, 30d supply, fill #0
  Filled 2023-12-11 – 2023-12-13 (×2): qty 60, 30d supply, fill #1
  Filled 2024-01-13: qty 60, 30d supply, fill #2
  Filled 2024-02-10: qty 60, 30d supply, fill #3

## 2023-11-19 ENCOUNTER — Encounter: Payer: Self-pay | Admitting: Registered Nurse

## 2023-11-19 ENCOUNTER — Telehealth: Payer: Self-pay | Admitting: Registered Nurse

## 2023-11-19 DIAGNOSIS — E876 Hypokalemia: Secondary | ICD-10-CM

## 2023-11-19 DIAGNOSIS — E7849 Other hyperlipidemia: Secondary | ICD-10-CM

## 2023-11-19 DIAGNOSIS — T502X5A Adverse effect of carbonic-anhydrase inhibitors, benzothiadiazides and other diuretics, initial encounter: Secondary | ICD-10-CM

## 2023-11-19 DIAGNOSIS — M0609 Rheumatoid arthritis without rheumatoid factor, multiple sites: Secondary | ICD-10-CM

## 2023-11-21 ENCOUNTER — Other Ambulatory Visit: Payer: Self-pay

## 2023-11-21 MED ORDER — PREDNISONE 10 MG PO TABS: 5.0000 mg | ORAL_TABLET | Freq: Every day | ORAL | Status: DC

## 2023-11-21 NOTE — Telephone Encounter (Signed)
Patient last filled potassium 90 tabs 07/30/23; prednisone 10mg  take 1/2 tab daily 21 tabs 10/31/23 and 03/19/2023 42 tabs and atorvastatin 40mg  09/19/23 90 tabs from [facility]  weight 231lbs in clinic wearing short sleeve shirt pants and shoes stated hasn't needed to take lasix much the past week as weight has been decreasing on its own with daily am checks

## 2023-11-22 ENCOUNTER — Other Ambulatory Visit (HOSPITAL_COMMUNITY): Payer: Self-pay

## 2023-11-25 ENCOUNTER — Ambulatory Visit (INDEPENDENT_AMBULATORY_CARE_PROVIDER_SITE_OTHER): Payer: No Typology Code available for payment source

## 2023-11-25 DIAGNOSIS — I4729 Other ventricular tachycardia: Secondary | ICD-10-CM | POA: Diagnosis not present

## 2023-11-25 DIAGNOSIS — I472 Ventricular tachycardia, unspecified: Secondary | ICD-10-CM

## 2023-11-25 LAB — CUP PACEART REMOTE DEVICE CHECK
Battery Remaining Longevity: 55 mo
Battery Remaining Percentage: 66 %
Battery Voltage: 2.98 V
Brady Statistic AP VP Percent: 82 %
Brady Statistic AP VS Percent: 9.3 %
Brady Statistic AS VP Percent: 7 %
Brady Statistic AS VS Percent: 1.1 %
Brady Statistic RA Percent Paced: 89 %
Brady Statistic RV Percent Paced: 89 %
Date Time Interrogation Session: 20241216011205
HighPow Impedance: 63 Ohm
Implantable Lead Connection Status: 753985
Implantable Lead Connection Status: 753985
Implantable Lead Implant Date: 20220829
Implantable Lead Implant Date: 20220829
Implantable Lead Location: 753859
Implantable Lead Location: 753860
Implantable Pulse Generator Implant Date: 20220829
Lead Channel Impedance Value: 360 Ohm
Lead Channel Impedance Value: 450 Ohm
Lead Channel Pacing Threshold Amplitude: 0.75 V
Lead Channel Pacing Threshold Amplitude: 1.25 V
Lead Channel Pacing Threshold Pulse Width: 0.5 ms
Lead Channel Pacing Threshold Pulse Width: 0.5 ms
Lead Channel Sensing Intrinsic Amplitude: 11.8 mV
Lead Channel Sensing Intrinsic Amplitude: 2.1 mV
Lead Channel Setting Pacing Amplitude: 2 V
Lead Channel Setting Pacing Amplitude: 2.5 V
Lead Channel Setting Pacing Pulse Width: 0.5 ms
Lead Channel Setting Sensing Sensitivity: 0.5 mV
Pulse Gen Serial Number: 810029878

## 2023-12-11 ENCOUNTER — Other Ambulatory Visit: Payer: Self-pay | Admitting: Registered Nurse

## 2023-12-11 ENCOUNTER — Other Ambulatory Visit (HOSPITAL_COMMUNITY): Payer: Self-pay

## 2023-12-11 DIAGNOSIS — E7849 Other hyperlipidemia: Secondary | ICD-10-CM

## 2023-12-11 MED FILL — Spironolactone Tab 25 MG: ORAL | 30 days supply | Qty: 30 | Fill #6 | Status: AC

## 2023-12-12 ENCOUNTER — Other Ambulatory Visit (HOSPITAL_COMMUNITY): Payer: Self-pay

## 2023-12-12 ENCOUNTER — Other Ambulatory Visit: Payer: Self-pay

## 2023-12-12 MED ORDER — COLCHICINE 0.6 MG PO TABS
0.6000 mg | ORAL_TABLET | Freq: Two times a day (BID) | ORAL | 1 refills | Status: DC | PRN
  Filled 2023-12-13 – 2023-12-21 (×2): qty 60, 30d supply, fill #0
  Filled 2024-01-13 – 2024-01-25 (×2): qty 60, 30d supply, fill #1

## 2023-12-13 ENCOUNTER — Other Ambulatory Visit: Payer: Self-pay | Admitting: Family Medicine

## 2023-12-13 ENCOUNTER — Other Ambulatory Visit (HOSPITAL_COMMUNITY): Payer: Self-pay

## 2023-12-13 ENCOUNTER — Other Ambulatory Visit: Payer: Self-pay | Admitting: Registered Nurse

## 2023-12-16 ENCOUNTER — Other Ambulatory Visit: Payer: Self-pay

## 2023-12-16 ENCOUNTER — Telehealth: Payer: Self-pay | Admitting: Internal Medicine

## 2023-12-16 NOTE — Telephone Encounter (Signed)
  Pt said, his monitor said need to reconnect when he tried that it ask for serial number and date of birth. He doesn't know his serial number

## 2023-12-17 NOTE — Telephone Encounter (Signed)
 Confirmed with patient he does not need prednisone, statin at this time.

## 2023-12-20 NOTE — Telephone Encounter (Signed)
 LMOVM for pt to call the device clinic to get help with his monitor.

## 2023-12-23 ENCOUNTER — Other Ambulatory Visit (HOSPITAL_COMMUNITY): Payer: Self-pay

## 2023-12-23 NOTE — Telephone Encounter (Signed)
 I spoke with the pt and help him set up his app. App is now set up.

## 2023-12-26 ENCOUNTER — Ambulatory Visit: Payer: No Typology Code available for payment source | Admitting: Registered Nurse

## 2023-12-26 ENCOUNTER — Encounter: Payer: Self-pay | Admitting: Registered Nurse

## 2023-12-26 VITALS — BP 130/90 | HR 76 | Temp 98.3°F | Wt 236.5 lb

## 2023-12-26 DIAGNOSIS — Z20822 Contact with and (suspected) exposure to covid-19: Secondary | ICD-10-CM

## 2023-12-26 DIAGNOSIS — J00 Acute nasopharyngitis [common cold]: Secondary | ICD-10-CM

## 2023-12-26 NOTE — Progress Notes (Signed)
EE in clinic to have a Covid test since co-worker has tested positive  within the hour.  He c/o runny nose and feeling warm. Temp 98.3 now.  Covid test - negative Reece Packer, RN, BSN, MPH, [facility]

## 2023-12-26 NOTE — Patient Instructions (Signed)
Infection Prevention Outside the Home This video describes steps you can take to help prevent the spread of infections outside of the home. To view the content, go to this web address: [email or web address]  This video will expire on: 06/02/2025. If you need access to this video following this date, please reach out to the healthcare provider who assigned it to you. This information is not intended to replace advice given to you by your health care provider. Make sure you discuss any questions you have with your health care provider. Elsevier Patient Education  2024 [employer] Inc. Infection Prevention Inside the Home This video describes steps to take to prevent the spread of infections inside of the home. To view the content, go to this web address: [email or web address]  This video will expire on: 06/02/2025. If you need access to this video following this date, please reach out to the healthcare provider who assigned it to you. This information is not intended to replace advice given to you by your health care provider. Make sure you discuss any questions you have with your health care provider. Elsevier Patient Education  2024 [employer].

## 2023-12-26 NOTE — Progress Notes (Signed)
Subjective:    Patient ID: Ryan Glass, male    DOB: Aug 25, 1956, 68 y.o.   MRN: [id number]  67y/o [place]ian married male established patient here for evaluation rhinitis after notification coworker test positive for covid today. Has felt hot and cold earlier today also.  Denied sore throat, n/v/d, cough, ear pain/discharge, sore throat.  Using his nasal saline and flonase daily.  Has not needed to take furosemide recently weight stable at home 234Lbs  Has noticed harder to breath outside in freezing temperatures denied wheezing/shortness of breath at work today.      Review of Systems  Constitutional:  Positive for chills and diaphoresis. Negative for fever.  HENT:  Positive for postnasal drip and rhinorrhea. Negative for congestion, ear discharge, ear pain, facial swelling, hearing loss, mouth sores, nosebleeds, sinus pressure, sinus pain, sore throat, tinnitus, trouble swallowing and voice change.   Eyes:  Negative for photophobia and visual disturbance.  Respiratory:  Negative for cough, shortness of breath, wheezing and stridor.   Gastrointestinal:  Negative for diarrhea, nausea and vomiting.  Genitourinary:  Negative for difficulty urinating.  Musculoskeletal:  Negative for gait problem and neck stiffness.  Skin:  Negative for rash.  Allergic/Immunologic: Positive for environmental allergies.  Neurological:  Negative for dizziness, tremors, syncope, facial asymmetry, speech difficulty, weakness and headaches.  Psychiatric/Behavioral:  Negative for agitation, confusion and sleep disturbance.        Objective:   Physical Exam Vitals and nursing note reviewed.  Constitutional:      General: He is awake. He is not in acute distress.    Appearance: Normal appearance. He is well-developed and well-groomed. He is obese. He is not ill-appearing, toxic-appearing or diaphoretic.  HENT:     Head: Normocephalic and atraumatic.     Jaw: There is normal jaw occlusion.     Salivary  Glands: Right salivary gland is not diffusely enlarged or tender. Left salivary gland is not diffusely enlarged or tender.     Right Ear: Hearing and external ear normal. No decreased hearing noted. No laceration, drainage, swelling or tenderness. A middle ear effusion is present. There is no impacted cerumen. No foreign body. No mastoid tenderness. No PE tube. No hemotympanum. Tympanic membrane is not injected, scarred, perforated, erythematous, retracted or bulging.     Left Ear: Hearing and external ear normal. No decreased hearing noted. No laceration, drainage, swelling or tenderness. A middle ear effusion is present. There is no impacted cerumen. No foreign body. No mastoid tenderness. No PE tube. No hemotympanum. Tympanic membrane is not injected, scarred, perforated, erythematous, retracted or bulging.     Ears:     Comments: Bilateral TMs intact air fluid level clear; no debris noted in auditory canals    Nose: Nose normal. No congestion or rhinorrhea.     Right Nostril: No epistaxis.     Left Nostril: No epistaxis.     Right Turbinates: Enlarged and swollen. Not pale.     Left Turbinates: Enlarged and swollen. Not pale.     Right Sinus: No maxillary sinus tenderness or frontal sinus tenderness.     Left Sinus: No maxillary sinus tenderness or frontal sinus tenderness.     Mouth/Throat:     Lips: Pink. No lesions.     Mouth: Mucous membranes are moist. No oral lesions or angioedema.     Dentition: Abnormal dentition. No gum lesions.     Tongue: No lesions. Tongue does not deviate from midline.     Palate: No  mass and lesions.     Pharynx: Uvula midline. Pharyngeal swelling, posterior oropharyngeal erythema and postnasal drip present. No oropharyngeal exudate or uvula swelling.     Tonsils: No tonsillar exudate or tonsillar abscesses.     Comments: Cobblestoning posterior pharynx; macular erythema oropharynx; bilateral allergic shiners Eyes:     General: Lids are normal. Vision grossly  intact. Gaze aligned appropriately. Allergic shiner present. No scleral icterus.       Right eye: No discharge.        Left eye: No discharge.     Extraocular Movements: Extraocular movements intact.     Conjunctiva/sclera: Conjunctivae normal.     Pupils: Pupils are equal, round, and reactive to light.  Neck:     Trachea: Trachea and phonation normal.  Cardiovascular:     Rate and Rhythm: Normal rate and regular rhythm.     Pulses:          Radial pulses are 2+ on the right side and 2+ on the left side.  Pulmonary:     Effort: Pulmonary effort is normal. No respiratory distress.     Breath sounds: Normal breath sounds and air entry. No stridor or transmitted upper airway sounds. No decreased breath sounds, wheezing, rhonchi or rales.     Comments: Spoke full sentences without difficulty; no cough observed in exam room Abdominal:     General: Abdomen is flat.     Palpations: Abdomen is soft.  Musculoskeletal:        General: Normal range of motion.     Right hand: Normal strength. Normal capillary refill.     Left hand: Normal strength. Normal capillary refill.     Cervical back: Normal range of motion and neck supple. No swelling, edema, erythema, signs of trauma, lacerations, rigidity, spasms, torticollis, tenderness or crepitus.     Thoracic back: No swelling, edema, signs of trauma, lacerations, spasms or tenderness.     Right lower leg: No edema.     Left lower leg: No edema.  Lymphadenopathy:     Head:     Right side of head: No submental, submandibular, tonsillar, preauricular, posterior auricular or occipital adenopathy.     Left side of head: No submental, submandibular, tonsillar, preauricular, posterior auricular or occipital adenopathy.     Cervical: No cervical adenopathy.     Right cervical: No superficial, deep or posterior cervical adenopathy.    Left cervical: No superficial, deep or posterior cervical adenopathy.  Skin:    General: Skin is warm and dry.      Capillary Refill: Capillary refill takes less than 2 seconds.     Coloration: Skin is not ashen, cyanotic, jaundiced, mottled, pale or sallow.     Findings: No abrasion, abscess, acne, bruising, burn, ecchymosis, erythema, signs of injury, laceration, lesion, petechiae, rash or wound.     Nails: There is no clubbing.     Comments: Face/neck/hands visually inspected  Neurological:     General: No focal deficit present.     Mental Status: He is alert and oriented to person, place, and time. Mental status is at baseline.     GCS: GCS eye subscore is 4. GCS verbal subscore is 5. GCS motor subscore is 6.     Cranial Nerves: Cranial nerves 2-12 are intact. No cranial nerve deficit, dysarthria or facial asymmetry.     Motor: Motor function is intact. No weakness, tremor, atrophy, abnormal muscle tone or seizure activity.     Coordination: Coordination is intact. Coordination  normal.     Gait: Gait is intact. Gait normal.     Comments: In/out of chair without difficulty; gait sure and steady in clinic; bilateral hand grasp equal 5/5  Psychiatric:        Attention and Perception: Attention and perception normal.        Mood and Affect: Mood and affect normal.        Speech: Speech normal.        Behavior: Behavior normal. Behavior is cooperative.        Thought Content: Thought content normal.        Cognition and Memory: Cognition and memory normal.        Judgment: Judgment normal.     Home covid test results negative.  Discussed with patient retest again in 6 days has tests at home sooner if new symptoms.  Recommended to wear mask around others if immunocompromised or symptoms start e.g. sore throat, cough, n/v/d, headache, body aches, fever, loss of taste/smell.  If symptoms start covid test and if negative repeat again in 48 hours.  Patient verbalized understanding information and had no further questions at that time.      Assessment & Plan:   A-acute rhinitis, close covid  contact  P-Patient given 1 free [place] govt home covid test to complete prior to returning to [employer].  Discussed covid symptoms new variant circulating in [place].  Patient up to date on covid 19 vaccine.  Discussed vasomotor rhinitis common due to freezing temperatures outside this past week.  Continue nasal saline and flonase use. Cover nose/mouth when outside with scarf or mask.  Follow up re-evaluation if fever/chills/dyspnea/wheezing/positive covid test.  Notify clinic staff via my chart or email [email or web address] if positive covid test and stay home.  [facility] handout how to prevent infection inside and outside home.  Patient verbalized understanding information/instructions, agreed with plan of care and had no further questions at this time.

## 2023-12-29 MED FILL — Spironolactone Tab 25 MG: ORAL | 30 days supply | Qty: 30 | Fill #7 | Status: CN

## 2023-12-30 ENCOUNTER — Other Ambulatory Visit: Payer: Self-pay

## 2023-12-30 ENCOUNTER — Other Ambulatory Visit (HOSPITAL_COMMUNITY): Payer: Self-pay

## 2023-12-30 NOTE — Progress Notes (Signed)
Remote ICD transmission.   

## 2024-01-14 ENCOUNTER — Other Ambulatory Visit (HOSPITAL_COMMUNITY): Payer: Self-pay

## 2024-01-14 ENCOUNTER — Other Ambulatory Visit: Payer: Self-pay

## 2024-01-14 ENCOUNTER — Ambulatory Visit: Payer: No Typology Code available for payment source | Attending: Physician Assistant | Admitting: Physician Assistant

## 2024-01-14 VITALS — BP 112/70 | HR 90 | Ht 70.0 in | Wt 244.0 lb

## 2024-01-14 DIAGNOSIS — I7121 Aneurysm of the ascending aorta, without rupture: Secondary | ICD-10-CM | POA: Diagnosis not present

## 2024-01-14 DIAGNOSIS — I472 Ventricular tachycardia, unspecified: Secondary | ICD-10-CM | POA: Diagnosis not present

## 2024-01-14 DIAGNOSIS — I422 Other hypertrophic cardiomyopathy: Secondary | ICD-10-CM | POA: Diagnosis not present

## 2024-01-14 DIAGNOSIS — I10 Essential (primary) hypertension: Secondary | ICD-10-CM

## 2024-01-14 DIAGNOSIS — Z79899 Other long term (current) drug therapy: Secondary | ICD-10-CM

## 2024-01-14 DIAGNOSIS — I480 Paroxysmal atrial fibrillation: Secondary | ICD-10-CM

## 2024-01-14 DIAGNOSIS — R0609 Other forms of dyspnea: Secondary | ICD-10-CM

## 2024-01-14 DIAGNOSIS — Q2112 Patent foramen ovale: Secondary | ICD-10-CM

## 2024-01-14 DIAGNOSIS — Z95810 Presence of automatic (implantable) cardiac defibrillator: Secondary | ICD-10-CM

## 2024-01-14 MED ORDER — SPIRONOLACTONE 25 MG PO TABS
25.0000 mg | ORAL_TABLET | Freq: Every day | ORAL | 3 refills | Status: AC
  Filled 2024-01-14: qty 90, 90d supply, fill #0
  Filled 2024-01-25 – 2024-04-28 (×2): qty 90, 90d supply, fill #1
  Filled 2024-08-04 – 2024-08-05 (×2): qty 90, 90d supply, fill #2
  Filled 2024-11-29 – 2024-11-30 (×2): qty 90, 90d supply, fill #0

## 2024-01-14 MED ORDER — SILDENAFIL CITRATE 50 MG PO TABS
50.0000 mg | ORAL_TABLET | Freq: Every day | ORAL | 0 refills | Status: DC | PRN
  Filled 2024-01-14: qty 10, 10d supply, fill #0

## 2024-01-14 NOTE — Progress Notes (Signed)
 Office Visit    Patient Name: Ryan Glass Date of Encounter: 01/14/2024  PCP:  Aletha Bene, MD   [facility]  Cardiologist:  Wilbert Bihari, MD  Advanced Practice Provider:  No care team member to display Electrophysiologist:  Danelle Birmingham, MD   HPI    Ryan Glass is a 68 y.o. male with a past medical history significant for PVCs status post PVC ablation with Dr. 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  He was last seen by Dr. Bihari 01/26/2022.  History includes hospitalization for chest pain 05/26/2021.  Found to have acute PE with RV failure mildly reduced LVEF 45 to 50%.  Right and left heart catheterization 05/29/2021 showed 20% proximal RCA, 20% proximal to mid circumflex, 30% mid LAD with normal right and left heart pressures.  His shortness of breath was felt to be out of proportion to his very small PE.  Echocardiogram images were reviewed and felt RV was mildly dilated but RV EF was normal.  LVEF was normal.  He was referred to pulmonology for ongoing shortness of breath and restarted on diltiazem  180 mg daily and Lasix  was stopped because he was not fluid overloaded.  He was restarted on chlorthalidone  25 mg daily for hypertension.  He was seen August 2022 by Rosaline Pavy, and was still complaining of DOE with little activity.  He was also complaining of low heart rates.  He saw pulmonary for dyspnea on exertion/COPD and placed on O2 by pulmonary.  O2 sats were in the 80s with walking.  Noted on EKG to have bradycardia and frequent PVCs.  Also complaining of leg swelling but had been eating fast food at least 3 days a week.  He wore a heart monitor to assess bradycardia and PVC load and was noted to have symptomatic ventricular  tachycardia on monitor as well as PAF and was admitted to the ER.  Echo showed normal LVEF 66 5% with mild LVH, mildly reduced RV EF and mild RVE and positive PFO.  Started on verapamil  for atrial fibrillation/VT/PVCs and monitored on telemetry.  Cardiac MRI showed findings consistent with HOCM and LVEF 52% and RVEF 45%.  He underwent ICD placement 08/07/2021 and was discharged on carvedilol  and verapamil .  He was hospitalized October 2022 with worsening shortness of breath with acute hypoxic respiratory failure.  He was eventually weaned off of oxygen and was hypovolemic on admission.  Unfortunately he was found to have GI bleed and underwent EGD showing gastric ulcer and gastritis.  Hemoglobin was stabilized at discharge.  Recommended to hold aspirin  until ulcer treatment was completed.  His losartan  had been held because of soft blood pressures in the hospital.  Chlorthalidone  was stopped and he was continued on oral Lasix  and spironolactone .  He was seen by Dr. Wonda 11/2021 for follow-up of his PFO.  At that time he was no longer hypoxemic and was felt that his chronic dyspnea was multifactorial from chronic CHF, obesity, and deconditioning.  Not felt that transcatheter PFO closure would benefit him.  Also referred to Dr. Tamra for HOCM.  It was recommended that alpha galactosidase level be checked to r/o Fabry's disease.  Genetic testing was also discussed but family wanted to wait until they saw him back in 3 months.  He was last seen by Dr. Bihari  01/26/2022 and was seen by [facility] cardiology and EP for second opinion.  They were concerned that his symptoms may be related to PVCs.  They felt that he did not need to beta-blockers and his metoprolol  was stopped but he was continued on sotalol  despite having HOCM.  Continued to have shortness of breath.  Not on oxygen.  Daughter was frustrated that nobody could find the cause of his shortness of breath and his oxygen at home intermittently drop down  to 89%.  Denied chest pain or pressure.  Denied palpitations or syncope.  Compliant with medications.  Chronic lower extremity edema.  He was seen by me 2/24, he states he has been feeling much better than the previous year.  Feels good today.  He had his knees replaced May and then July of last year.  He did physical therapy and did well with that.  His last CT scan of his aorta was a year ago so he is due for an annual scan.  Ascending aortic aneurysm measured 4.4 cm at that time.  We discussed tight blood pressure control and he is encouraged to monitor this at home.  He has been compliant with his medications.  We have provided refills today.  He is asked for sildenafil  prescription today.  He plans to go to [place] at the end of this month through March so we will try to get his CT scan done before then.  He was seen by Charlies Arthur, PA-C and was doing okay at that time.  Occasionally felt like when he got out of the shower his heart rate skipped a beat.  No sustained palpitations.  No syncope/presyncope or dizzy spells.  No chest pain.  Some shortness of breath.  No edema.  ICD was functioning properly.  He remained on sotalol .  Labs were reviewed at that time.  Continue on Eliquis , low PAF burden.  Some hypertension but similar to home BP readings.  He was seen by me 05/2023, he tells me that his heart rate sometimes goes between 110-115bpm.  Oxygen was 88% today.  He tells me he had an incident where he went to take the trash cans out and his heart rate was racing and he felt like he was going to pass out.  He feels exhausted.  Lately, his heart rate has been going off oxygen has been dropping.  It has been as low as 82% at night when he takes his CPAP off to use the restroom.  He saw Dr. Kara in August of last year and likely needs a follow-up which we discussed today.  We discussed ordering oxygen for him to use at home as needed.  I also discussed his EKG with Dr. Waddell and we switched his  metoprolol  to tartrate to metoprolol  succinate.  I continued his sotalol .  He will see him in follow-up in August.   Reports no chest pain, pressure, or tightness. No edema, orthopnea, PND.   Today, he presents with a history of heart disease managed with an ICD for routine follow-up. He reports a recent adjustment to his ICD settings to maintain a higher heart rate, which he monitors at home. He denies any issues with his blood pressure. He has an upcoming CT scan scheduled to monitor his aorta, which is part of his routine care.  The patient also reports issues with his CPAP machine, which he uses for sleep apnea. He has two machines, one set at a pressure of 8 and another at 70.  He finds the pressure of 70 too strong, causing leaks in his mask, and prefers the machine set at 8. He has been using the older machine set at 8, but would like the new machine's settings adjusted.  In addition, the patient has been self-monitoring his oxygen levels at night. He initially stopped using supplemental oxygen when his levels were consistently in the high 90s. However, he noticed a drop to the high 80s and low 90s after discontinuing the oxygen, and has since resumed its use.  The patient also requests refills for his spironolactone  and Viagra  prescriptions. He prefers a 90-day supply of spironolactone  for convenience. He also notes that his Viagra  prescription does not automatically refill through his MyChart account  Reports no shortness of breath nor dyspnea on exertion. Reports no chest pain, pressure, or tightness. No edema, orthopnea, PND. Reports no palpitations.   Discussed the use of AI scribe software for clinical note transcription with the patient, who gave verbal consent to proceed.  Past Medical History    Past Medical History:  Diagnosis Date   AICD (automatic cardioverter/defibrillator) present    Ascending aortic aneurysm (HCC)    a. CT 05/2021 showed 4.4x4.4cm TAA. Stable on chest CTA  12/2021 at 4.4cm.   Atrial fibrillation (HCC)    Bilateral lower extremity edema    Celiac artery dilatation (HCC)    1.8cm at the celiac trunk on Chest CT 12/2021   CHF (congestive heart failure) (HCC)    Chronic gout without tophus    followed by dr ziolkowska    (06-08-2020  per pt last episode right knee 3 wks ago)   CKD (chronic kidney disease), stage II    nephrology--- Manuelita Labella PA (10-29-2019 note in epic scanned in  media)   Coronary artery disease    Heart failure with mid-range ejection fraction (HCC) 05/27/2021   05/26/21: Left ventricular ejection fraction, by estimation, is 45 to 50%. There is severe asymmetric left ventricular hypertrophy. G1DD present.    History of COVID-19 06/07/2021   HOCM (hypertrophic obstructive cardiomyopathy) (HCC)    s/p ICD 07/2021   Hypertension    followed by cardiology, dr t. turner   (05-14-2018 nuclear study in epic , normal perfusion with nuclear ef 61%)   Left hydrocele    Low serum potassium level 04/27/2014   OSA on CPAP    per pt uses every night   Palpitations followed by dr t. shlomo   06-08-2020  still feels palipations due to PVCs when exertion but not with chest pain/ discomfort   PVC's (premature ventricular contractions) cardiologist--- dr t. turner   Status post PVC ablation by Dr. Waddell 2019 with recurrence of frequent PVCs/bigeminy;  prior pseudobradycardia r/t pvcs   Rheumatoid arthritis involving multiple sites [facility])    rheumotology--- dr a. ziolkowska  ([facility] in [place])   Past Surgical History:  Procedure Laterality Date   BIOPSY  10/08/2021   Procedure: BIOPSY;  Surgeon: Stacia Glendia BRAVO, MD;  Location: [facility] ENDOSCOPY;  Service: Gastroenterology;;   VASSIE STUDY  08/18/2021   Procedure: BUBBLE STUDY;  Surgeon: Delford Maude BROCKS, MD;  Location: [facility] ENDOSCOPY;  Service: Cardiovascular;;   CATARACT EXTRACTION  04/01/2023   ESOPHAGOGASTRODUODENOSCOPY N/A 10/08/2021   Procedure: ESOPHAGOGASTRODUODENOSCOPY (EGD);  Surgeon:  Stacia Glendia BRAVO, MD;  Location: [facility] ENDOSCOPY;  Service: Gastroenterology;  Laterality: N/A;   HYDROCELE EXCISION Left 06/14/2020   Procedure: LEFT  HYDROCELECTOMY ADULT;  Surgeon: Elisabeth Valli BIRCH, MD;  Location: [facility];  Service: Urology;  Laterality: Left;   ICD IMPLANT N/A 08/07/2021   Procedure: ICD IMPLANT;  Surgeon: Waddell Danelle ORN, MD;  Location: [facility] INVASIVE CV LAB;  Service: Cardiovascular;  Laterality: N/A;   INCISIONAL HERNIA REPAIR  02-23-2016   @[facility]    LAPAROSCOPIC   LAPAROSCOPIC INGUINAL HERNIA REPAIR Bilateral 08-22-2015  @[facility]    AND UMBILICAL HERNIA REPAIR   PVC ABLATION N/A 10/07/2018   Procedure: PVC ABLATION;  Surgeon: Waddell Danelle ORN, MD;  Location: [facility] INVASIVE CV LAB;  Service: Cardiovascular;  Laterality: N/A;   RIGHT/LEFT HEART CATH AND CORONARY ANGIOGRAPHY N/A 05/29/2021   Procedure: RIGHT/LEFT HEART CATH AND CORONARY ANGIOGRAPHY;  Surgeon: Verlin Lonni BIRCH, MD;  Location: [facility] INVASIVE CV LAB;  Service: Cardiovascular;  Laterality: N/A;   TEE WITHOUT CARDIOVERSION N/A 08/18/2021   Procedure: TRANSESOPHAGEAL ECHOCARDIOGRAM (TEE);  Surgeon: Delford Maude BROCKS, MD;  Location: [facility] ENDOSCOPY;  Service: Cardiovascular;  Laterality: N/A;   TOTAL KNEE ARTHROPLASTY Left 04/30/2022   Procedure: LEFT TOTAL KNEE ARTHROPLASTY;  Surgeon: Liam Lerner, MD;  Location: [facility] ORS;  Service: Orthopedics;  Laterality: Left;   TOTAL KNEE ARTHROPLASTY Right 06/18/2022   Procedure: RIGHT TOTAL KNEE ARTHROPLASTY;  Surgeon: Liam Lerner, MD;  Location: [facility] ORS;  Service: Orthopedics;  Laterality: Right;   UMBILICAL HERNIA REPAIR  child    Allergies  Allergies  Allergen Reactions   Amlodipine  Besylate Swelling and Other (See Comments)    Leg swelling with 10 mg daily am dosing; decreased with BID 5 mg dosing   Aspirin  Itching    EKGs/Labs/Other Studies Reviewed:   The following studies were reviewed today: Cardiac Cath 05/29/2021 reviewed as below:    Conclusion     Prox RCA lesion is 20% stenosed. Prox Cx to Mid Cx lesion is 20% stenosed. Mid LAD lesion is 30% stenosed.   Mild non-obstructive CAD Normal right and left heart pressures   Recommendations: Medical management of mild CAD. Will resume IV heparin  6 hours post sheath pull given acute PE. Would transition to Eliquis  or Xarelto tomorrow.     Echo 05/26/21 IMPRESSIONS     1. Left ventricular ejection fraction, by estimation, is 45 to 50%. The  left ventricle has mildly decreased function. The left ventricle has no  regional wall motion abnormalities. There is severe asymmetric left  ventricular hypertrophy. Left ventricular   diastolic parameters are consistent with Grade I diastolic dysfunction  (impaired relaxation).   2. Right ventricular systolic function is moderately reduced. The right  ventricular size is moderately enlarged. There is moderately elevated  pulmonary artery systolic pressure.   3. Left atrial size was moderately dilated.   4. Right atrial size was mildly dilated.   5. The mitral valve is normal in structure. Mild mitral valve  regurgitation.   6. The aortic valve is normal in structure. Aortic valve regurgitation is  not visualized. No aortic stenosis is present.   7. Aortic dilatation noted. There is mild dilatation of the ascending  aorta, measuring 41 mm.     CT 06/07/21 IMPRESSION: 1. Bland appearing, bandlike scarring and or partial atelectasis of the dependent lungs, unchanged compared to prior examination. No evidence of fibrotic interstitial lung disease. No significant air trapping on expiratory phase imaging. 2. Cardiomegaly and coronary artery disease. 3. Unchanged enlargement of the tubular ascending thoracic aorta, measuring up to 4.4 x 4.4 cm. Recommend annual imaging followup by CTA or MRA if not otherwise imaged. This recommendation follows 2010 ACCF/AHA/AATS/ACR/ASA/SCA/SCAI/SIR/STS/SVM Guidelines for the Diagnosis and  Management of  Patients with Thoracic Aortic Disease. Circulation. 2010; 121: [id number]. Aortic aneurysm NOS (ICD10-I71.9)   Aortic Atherosclerosis (ICD10-I70.0).     Electronically Signed   By: Marolyn Jaksch M.D.   On: 06/07/2021 15:12   cMRI 07/2021 IMPRESSION: 1. Asymmetric LV hypertrophy measuring 18mm in basal septum (10mm in basal lateral wall). This meets criteria for hypertrophic cardiomyopathy   2. Patchy late gadolinium enhancement at RV insertion site and basal lateral wall. LGE accounts for 1% of total myocardial mass. While this LGE pattern is consistent with HCM, would also consider Fabry disease as it can present with basal lateral LGE and LVH. Would recommend checking alpha-galactosidase A level   3. Normal LV size and systolic function (EF 52%), though was difficult to quantify volumes/EF due to significant motion artifact during acquisition   4.  Normal RV size with mild systolic dysfunction (EF 45%)   5.  Small pericardial effusion   6.  Dilated ascending aorta measuring 43mm  EKG:  EKG is not ordered today.   Recent Labs: 03/18/2023: BUN 22; Creatinine, Ser 1.24; Potassium 3.6; Sodium 142 03/20/2023: Hemoglobin 15.6; Platelets 219 08/20/2023: Magnesium  1.9  Recent Lipid Panel    Component Value Date/Time   CHOL 116 10/01/2022 1055   TRIG 88 10/01/2022 1055   HDL 39 (L) 10/01/2022 1055   CHOLHDL 3.0 10/01/2022 1055   CHOLHDL 2.5 05/27/2021 0138   VLDL 13 05/27/2021 0138   LDLCALC 60 10/01/2022 1055     Home Medications   Current Meds  Medication Sig   Abatacept  (ORENCIA  CLICKJECT) 125 MG/ML SOAJ Inject 125 mg into the skin every Sunday.   acetaminophen -codeine  (TYLENOL  #3) 300-30 MG tablet Take 1 tablet by mouth every 12 (twelve) hours as needed for moderate pain (4-6).   albuterol  (VENTOLIN  HFA) 108 (90 Base) MCG/ACT inhaler Inhale 2 puffs into the lungs every 4 (four) hours as needed for wheezing or shortness of breath.   allopurinol  (ZYLOPRIM )  100 MG tablet Take 1 tablet (100 mg total) by mouth daily.   apixaban  (ELIQUIS ) 5 MG TABS tablet Take 1 tablet (5 mg total) by mouth 2 (two) times daily.   atorvastatin  (LIPITOR) 40 MG tablet Take 1 tablet (40 mg total) by mouth daily.   azelastine  (ASTELIN ) 0.1 % nasal spray Place 1 spray into both nostrils 2 (two) times daily. Stop taking claritin /zyrtec/loratadine /cetirizine (if taking).   Cholecalciferol  (VITAMIN D3) 50 MCG (2000 UT) capsule Take 1 capsule (2,000 Units total) by mouth daily. Take with food   colchicine  0.6 MG tablet Take 1 tablet (0.6 mg total) by mouth 2 (two) times daily as needed for muscle/joint pain.   ferrous sulfate  325 (65 FE) MG tablet Take 325 mg by mouth daily.   fluticasone  (FLONASE ) 50 MCG/ACT nasal spray Place 1 spray into both nostrils daily. (Patient taking differently: Place 1 spray into both nostrils daily as needed for allergies.)   fluticasone -salmeterol (ADVAIR) 100-50 MCG/ACT AEPB Inhale 1 puff into the lungs 2 (two) times daily.   furosemide  (LASIX ) 40 MG tablet Take 1 tablet (40 mg total) by mouth daily as needed for fluid or edema (weight gain).   gabapentin  (NEURONTIN ) 300 MG capsule Take 1 capsule (300 mg total) by mouth 3 (three) times daily.   hydrALAZINE  (APRESOLINE ) 100 MG tablet Take 1 tablet (100 mg total) by mouth 3 (three) times daily.   magnesium  oxide (MAG-OX) 400 MG tablet Take 1 tablet (400 mg total) by mouth every other day. While taking daily multivitamin with 100mg  magnesium  every  day   metoprolol  succinate (TOPROL -XL) 50 MG 24 hr tablet Take 1 tablet (50 mg total) by mouth daily. Take with or immediately following a meal.   montelukast  (SINGULAIR ) 10 MG tablet Take 1 tablet (10 mg total) by mouth at bedtime.   Multiple Vitamin (MULTIVITAMIN WITH MINERALS) TABS tablet Take 1 tablet by mouth daily.   potassium chloride  SA (KLOR-CON  M) 20 MEQ tablet Take 1 tablet (20 mEq total) by mouth daily as needed (with furosemide ).   predniSONE   (DELTASONE ) 10 MG tablet Take 0.5 tablets (5 mg total) by mouth daily with breakfast.   sodium chloride  (OCEAN) 0.65 % SOLN nasal spray Place 2 sprays into both nostrils every 2 (two) hours while awake.   sotalol  (BETAPACE ) 120 MG tablet Take 1 tablet (120 mg total) by mouth every 12 (twelve) hours.   [DISCONTINUED] sildenafil  (VIAGRA ) 50 MG tablet Take 1 tablet (50 mg total) by mouth daily as needed for ED   [DISCONTINUED] spironolactone  (ALDACTONE ) 25 MG tablet Take 1 tablet (25 mg total) by mouth daily.     Review of Systems      All other systems reviewed and are otherwise negative except as noted above.  Physical Exam    VS:  BP 112/70 (BP Location: Right Arm)   Pulse 90   Ht 5' 10 (1.778 m)   Wt 244 lb (110.7 kg)   SpO2 92%   BMI 35.01 kg/m  , BMI Body mass index is 35.01 kg/m.  Wt Readings from Last 3 Encounters:  01/14/24 244 lb (110.7 kg)  12/26/23 236 lb 8 oz (107.3 kg)  07/23/23 230 lb (104.3 kg)     GEN: Well nourished, well developed, in no acute distress. HEENT: normal. Neck: Supple, no JVD, carotid bruits, or masses. Cardiac: RRR, no murmurs, rubs, or gallops. No clubbing, cyanosis, edema.  Radials/PT 2+ and equal bilaterally.  Respiratory:  Respirations regular and unlabored, clear to auscultation bilaterally. GI: Soft, nontender, nondistended. MS: No deformity or atrophy. Skin: Warm and dry, no rash. Neuro:  Strength and sensation are intact. Psych: Normal affect.  Assessment & Plan    Implantable Cardioverter Defibrillator (ICD)/Afib Device check was normal. Heart rate set to 90s. -Continue current device settings.  Hypertension Blood pressure well controlled. -Continue current antihypertensive regimen.  Aortic Disease Annual CT scan monitoring. -Next CT scan scheduled for August 2025.( Insurance reasons)  Sleep Apnea Patient using CPAP machine set at 8, but new machine set at 70 is causing discomfort. -Contact Dr. Dorine office to adjust  new CPAP machine settings to 8-10.  Oxygen Use Patient using oxygen at night, but wishes to return oxygen tank. -Advise patient to continue using oxygen until CPAP settings are adjusted.  Medication Refills Patient requests 90-day supply of spironolactone  and refill of sildenafil  (Viagra ). -Refill spironolactone  for 90 days. -Refill sildenafil  as previously prescribed.  PFO -not felt that transcatheter closure would benefit him  HOCM -LVEF 65-70%, grade 1DD, no significant valvular disease, dilatation of the aortic root 47mm.  -continue current medication regimen -Sildenafil  prescription provided today, no adverse side effects   Disposition: Follow up 6 months with Wilbert Bihari, MD or APP.  Signed, Orren LOISE Fabry, PA-C 01/14/2024, 4:26 PM [facility]

## 2024-01-14 NOTE — Patient Instructions (Signed)
Medication Instructions:  Your physician recommends that you continue on your current medications as directed. Please refer to the Current Medication list given to you today.  *If you need a refill on your cardiac medications before your next appointment, please call your pharmacy*  Follow-Up: At [facility], you and your health needs are our priority.  As part of our continuing mission to provide you with exceptional heart care, we have created designated Provider Care Teams.  These Care Teams include your primary Cardiologist (physician) and Advanced Practice Providers (APPs -  Physician Assistants and Nurse Practitioners) who all work together to provide you with the care you need, when you need it.  We recommend signing up for the patient portal called "MyChart".  Sign up information is provided on this After Visit Summary.  MyChart is used to connect with patients for Virtual Visits (Telemedicine).  Patients are able to view lab/test results, encounter notes, upcoming appointments, etc.  Non-urgent messages can be sent to your provider as well.   To learn more about what you can do with MyChart, go to [email or web address].    Your next appointment:   6 month(s)  Provider:   [name], MD    Other Instructions     1st Floor: - Lobby - Registration  - Pharmacy  - Lab - Cafe  2nd Floor: - PV Lab - Diagnostic Testing (echo, CT, nuclear med)  3rd Floor: - Vacant  4th Floor: - TCTS (cardiothoracic surgery) - AFib Clinic - Structural Heart Clinic - Vascular Surgery  - Vascular Ultrasound  5th Floor: - HeartCare Cardiology (general and EP) - Clinical Pharmacy for coumadin, hypertension, lipid, weight-loss medications, and med management appointments    Valet parking services will be available as well.

## 2024-01-20 ENCOUNTER — Telehealth: Payer: Self-pay

## 2024-01-20 NOTE — Telephone Encounter (Signed)
 Spoke with patient and shared with him information exchanged below between Dr. Micael Adas and Lovette Rud, PA-C regarding adjustments to new CPAP machine settings.  Informed patient he will need to contact his pulmonologist to discuss changing settings on CPAP machine.  Patient verbalized understanding.

## 2024-01-20 NOTE — Telephone Encounter (Signed)
 Patient got a new machine 04/17/2019 from pulmonology Dr Duaine German. He will call his pulmonary doctor to change his settings.

## 2024-01-20 NOTE — Telephone Encounter (Signed)
-----   Message from [facility] I sent at 01/20/2024  1:11 PM EST -----  ----- Message ----- From: Heywood Louder Sent: 01/15/2024   9:04 AM EST To: Jacqueline Matsu, MD; Clive Dancer, CMA  He conveniently left that part out. We can give him a call. In the case orders will need to come from Pulmonary.  Thank you! ----- Message ----- From: Jacqueline Matsu, MD Sent: 01/15/2024   8:57 AM EST To: Joslyn Nim, CMA; Von Grumbling, PA-C  When did he get a new CPAP? - I have not seen him in over 2 years and it looks like pulmonary is following CPAP now ----- Message ----- From: Heywood Louder Sent: 01/14/2024   4:30 PM EST To: Jacqueline Matsu, MD; Joslyn Nim, CMA  He tells me his old CPAP works better since the pressure setting is lower (8). His new CPAP is set to 17 and its too high for his needs. He requested an order change on his new device. Doing well from a rhythm standpoint. F/u CTA already scheduled for his ascending aortic aneurysm. He will see you in 6 months, thanks!

## 2024-01-26 ENCOUNTER — Other Ambulatory Visit: Payer: Self-pay

## 2024-01-26 ENCOUNTER — Other Ambulatory Visit (HOSPITAL_COMMUNITY): Payer: Self-pay

## 2024-01-28 ENCOUNTER — Other Ambulatory Visit (HOSPITAL_COMMUNITY): Payer: Self-pay

## 2024-02-10 ENCOUNTER — Other Ambulatory Visit (HOSPITAL_COMMUNITY): Payer: Self-pay

## 2024-02-10 MED FILL — Albuterol Sulfate Inhal Aero 108 MCG/ACT (90MCG Base Equiv): RESPIRATORY_TRACT | 25 days supply | Qty: 6.7 | Fill #1 | Status: AC

## 2024-02-11 ENCOUNTER — Other Ambulatory Visit: Payer: Self-pay

## 2024-02-11 ENCOUNTER — Other Ambulatory Visit (HOSPITAL_COMMUNITY): Payer: Self-pay

## 2024-02-11 MED ORDER — COLCHICINE 0.6 MG PO TABS
0.6000 mg | ORAL_TABLET | Freq: Two times a day (BID) | ORAL | 1 refills | Status: DC | PRN
  Filled 2024-02-11 – 2024-02-22 (×2): qty 60, 30d supply, fill #0
  Filled 2024-03-26: qty 60, 30d supply, fill #1

## 2024-02-20 ENCOUNTER — Ambulatory Visit

## 2024-02-20 ENCOUNTER — Ambulatory Visit: Admitting: Registered Nurse

## 2024-02-20 VITALS — BP 129/93 | HR 90 | Temp 97.2°F

## 2024-02-20 DIAGNOSIS — R051 Acute cough: Secondary | ICD-10-CM

## 2024-02-20 NOTE — Progress Notes (Signed)
 Pt in to see NP r/t feeling ill.

## 2024-02-24 ENCOUNTER — Ambulatory Visit

## 2024-02-24 ENCOUNTER — Other Ambulatory Visit (HOSPITAL_COMMUNITY): Payer: Self-pay

## 2024-02-24 ENCOUNTER — Other Ambulatory Visit: Payer: Self-pay

## 2024-02-24 ENCOUNTER — Ambulatory Visit: Payer: No Typology Code available for payment source

## 2024-02-24 VITALS — BP 113/92 | HR 90 | Temp 98.0°F

## 2024-02-24 DIAGNOSIS — I4729 Other ventricular tachycardia: Secondary | ICD-10-CM | POA: Diagnosis not present

## 2024-02-24 DIAGNOSIS — Z95818 Presence of other cardiac implants and grafts: Secondary | ICD-10-CM | POA: Diagnosis not present

## 2024-02-24 DIAGNOSIS — R0609 Other forms of dyspnea: Secondary | ICD-10-CM

## 2024-02-24 DIAGNOSIS — I472 Ventricular tachycardia, unspecified: Secondary | ICD-10-CM | POA: Diagnosis not present

## 2024-02-24 NOTE — Progress Notes (Signed)
 Pt c/o SOB spoke with NP and she recommended administering Benadryl. Lungs clear Bilateral upper and lower lungs.

## 2024-02-25 ENCOUNTER — Encounter: Payer: Self-pay | Admitting: Internal Medicine

## 2024-02-25 ENCOUNTER — Encounter: Payer: Self-pay | Admitting: Registered Nurse

## 2024-02-25 ENCOUNTER — Ambulatory Visit: Admitting: Registered Nurse

## 2024-02-25 VITALS — BP 129/95 | HR 90 | Temp 98.2°F

## 2024-02-25 DIAGNOSIS — J019 Acute sinusitis, unspecified: Secondary | ICD-10-CM

## 2024-02-25 DIAGNOSIS — R0609 Other forms of dyspnea: Secondary | ICD-10-CM

## 2024-02-25 LAB — CUP PACEART REMOTE DEVICE CHECK
Battery Remaining Longevity: 52 mo
Battery Remaining Percentage: 63 %
Battery Voltage: 2.96 V
Brady Statistic AP VP Percent: 71 %
Brady Statistic AP VS Percent: 23 %
Brady Statistic AS VP Percent: 4.2 %
Brady Statistic AS VS Percent: 1 %
Brady Statistic RA Percent Paced: 93 %
Brady Statistic RV Percent Paced: 75 %
Date Time Interrogation Session: 20250317033233
HighPow Impedance: 77 Ohm
Implantable Lead Connection Status: 753985
Implantable Lead Connection Status: 753985
Implantable Lead Implant Date: 20220829
Implantable Lead Implant Date: 20220829
Implantable Lead Location: 753859
Implantable Lead Location: 753860
Implantable Pulse Generator Implant Date: 20220829
Lead Channel Impedance Value: 350 Ohm
Lead Channel Impedance Value: 460 Ohm
Lead Channel Pacing Threshold Amplitude: 0.75 V
Lead Channel Pacing Threshold Amplitude: 1.25 V
Lead Channel Pacing Threshold Pulse Width: 0.5 ms
Lead Channel Pacing Threshold Pulse Width: 0.5 ms
Lead Channel Sensing Intrinsic Amplitude: 4.1 mV
Lead Channel Sensing Intrinsic Amplitude: 8.2 mV
Lead Channel Setting Pacing Amplitude: 2 V
Lead Channel Setting Pacing Amplitude: 2.5 V
Lead Channel Setting Pacing Pulse Width: 0.5 ms
Lead Channel Setting Sensing Sensitivity: 0.5 mV
Pulse Gen Serial Number: 810029878

## 2024-02-25 NOTE — Progress Notes (Signed)
 In to see NP for SOB

## 2024-02-25 NOTE — Patient Instructions (Addendum)
 Shortness of Breath, Adult Shortness of breath means you have trouble breathing. Shortness of breath could be a sign of a medical problem. Follow these instructions at home:  Pollution Do not smoke or use any products that contain nicotine or tobacco. If you need help quitting, ask your doctor. Avoid things that can make it harder to breathe, such as: Smoke of all kinds. This includes smoke from campfires or forest fires. Do not smoke or allow others to smoke in your home. Mold. Dust. Air pollution. Chemical smells. Things that can give you an allergic reaction (allergens) if you have allergies. Keep your living space clean. Use products that help remove mold and dust. General instructions Watch for any changes in your symptoms. Take over-the-counter and prescription medicines only as told by your doctor. This includes oxygen therapy and inhaled medicines. Rest as needed. Return to your normal activities when your doctor says that it is safe. Keep all follow-up visits. Contact a doctor if: Your condition does not get better as soon as expected. You have a hard time doing your normal activities, even after you rest. You have new symptoms. You cannot walk up stairs. You cannot exercise the way you normally do. Get help right away if: Your shortness of breath gets worse. You have trouble breathing when you are resting. You feel light-headed or you faint. You have a cough that is not helped by medicines. You cough up blood. You have pain with breathing. You have pain in your chest, arms, shoulders, or belly (abdomen). You have a fever. These symptoms may be an emergency. Get help right away. Call 911. Do not wait to see if the symptoms will go away. Do not drive yourself to the hospital. Summary Shortness of breath is when you have trouble breathing enough air. It can be a sign of a medical problem. Avoid things that make it hard for you to breathe, such as smoking, pollution,  mold, and dust. Watch for any changes in your symptoms. Contact your doctor if you do not get better or you get worse. This information is not intended to replace advice given to you by your health care provider. Make sure you discuss any questions you have with your health care provider. Document Revised: 07/15/2021 Document Reviewed: 07/15/2021 Elsevier Patient Education  2024 Elsevier Inc.How to Perform a Sinus Rinse A sinus rinse is a home treatment that is used to rinse your sinuses with a germ-free (sterile) mixture of salt and water (saline solution). Sinuses are air-filled spaces in your skull that are behind the bones of your face and forehead. They open into your nasal cavity. A sinus rinse can help to clear mucus, dirt, dust, or pollen from your nasal cavity. You may do a sinus rinse when you have a cold, a virus, nasal allergy symptoms, a sinus infection, or stuffiness in your nose or sinuses. What are the risks? A sinus rinse is generally safe and effective. However, there are a few risks, which include: A burning sensation in your sinuses. This may happen if you do not make the saline solution as directed. Be sure to follow all directions when making the saline solution. Nasal irritation. Infection. This may be from unclean supplies or from contaminated water. Infection from contaminated water is rare, but possible. Do not do a sinus rinse if you have had ear or nasal surgery, ear infection, or plugged ears, unless recommended by your health care provider. Supplies needed: Saline solution or powder. Distilled or sterile water to  mix with saline powder. You may use boiled and cooled tap water. Boil tap water for 5 minutes; cool until it is lukewarm. Use within 24 hours. Do not use regular tap water to mix with the saline solution. Neti pot or nasal rinse bottle. These supplies release the saline solution into your nose and through your sinuses. Neti pots and nasal rinse bottles can be  purchased at [employer], a health food store, or online. How to perform a sinus rinse  Wash your hands with soap and water for at least 20 seconds. If soap and water are not available, use hand sanitizer. Wash your device according to the directions that came with the product and then dry it. Use the solution that comes with your product or one that is sold separately in stores. Follow the mixing directions on the package to mix with sterile or distilled water. Fill the device with the amount of saline solution noted in the device instructions. Stand by a sink and tilt your head sideways over the sink. Place the spout of the device in your upper nostril (the one closer to the ceiling). Gently pour or squeeze the saline solution into your nasal cavity. The liquid should drain out from the lower nostril if you are not too congested. While rinsing, breathe through your open mouth. Gently blow your nose to clear any mucus and rinse solution. Blowing too hard may cause ear pain. Turn your head in the other direction and repeat in your other nostril. Clean and rinse your device with clean water and then air-dry it. Talk with your health care provider or pharmacist if you have questions about how to do a sinus rinse. Summary A sinus rinse is a home treatment that is used to rinse your sinuses with a sterile mixture of salt and water (saline solution). You may do a sinus rinse when you have a cold, a virus, nasal allergy symptoms, a sinus infection, or stuffiness in your nose or sinuses. A sinus rinse is generally safe and effective. Follow all instructions carefully. This information is not intended to replace advice given to you by your health care provider. Make sure you discuss any questions you have with your health care provider. Document Revised: 05/15/2021 Document Reviewed: 05/15/2021 Elsevier Patient Education  2024 Elsevier Inc.Allergic Rhinitis, Adult  Allergic rhinitis is an  allergic reaction that affects the mucous membrane inside the nose. The mucous membrane is the tissue that produces mucus. There are two types of allergic rhinitis: Seasonal. This type is also called hay fever and happens only during certain seasons. Perennial. This type can happen at any time of the year. Allergic rhinitis cannot be spread from person to person. This condition can be mild, bad, or very bad. It can develop at any age and may be outgrown. What are the causes? This condition is caused by allergens. These are things that can cause an allergic reaction. Allergens may differ for seasonal allergic rhinitis and perennial allergic rhinitis. Seasonal allergic rhinitis is caused by pollen. Pollen can come from grasses, trees, and weeds. Perennial allergic rhinitis may be caused by: Dust mites. Proteins in a pet's pee (urine), saliva, or dander. Dander is dead skin cells from a pet. Smoke, mold, or car fumes. Remains of or waste from insects such as cockroaches. What increases the risk? You are more likely to develop this condition if you have a family history of allergies or other conditions related to allergies, including: Allergic conjunctivitis. This is irritation and  swelling of parts of the eyes and eyelids. Asthma. This condition affects the lungs and makes it hard to breathe. Atopic dermatitis or eczema. This is long term (chronic) irritation and swelling of the skin. Food allergies. What are the signs or symptoms? Symptoms of this condition include: Sneezing or coughing. A stuffy nose (nasal congestion), itchy nose, or nasal discharge. Itchy eyes and tearing of the eyes. A feeling of mucus dripping down the back of your throat (postnasal drip). This may cause a sore throat. Trouble sleeping. Tiredness. Headache. How is this diagnosed? This condition may be diagnosed with your symptoms, your medical history, and a physical exam. Your health care provider may check for  related conditions, such as: Asthma. Pink eye. This is eye swelling and irritation caused by infection (conjunctivitis). Ear infection. Upper respiratory infection. This is an infection in the nose, throat, or upper airways. You may also have tests to find out which allergens cause your symptoms. These may include skin tests or blood tests. How is this treated? There is no cure for this condition, but treatment can help control symptoms. Treatment may include: Taking medicines that block allergy symptoms, such as corticosteroids (anti-inflammatories) and antihistamines. Medicine may be given as a shot, nasal spray, or pill. Avoiding any allergens. Being exposed again and again to tiny amounts of allergens to help you build a defense against allergens (allergenimmunotherapy). This is done if other treatments have not helped. It may include: Allergy shots. These are injected medicines that have small amounts of an allergen in them. Sublingual immunotherapy. This involves taking small doses of a medicine with an allergen in it under your tongue. If these treatments do not work, your provider may prescribe newer, stronger medicines. Follow these instructions at home: Avoiding allergens Find out what you are allergic to and avoid those allergens. These are some things you can do to help avoid allergens: If you have perennial allergies: Replace carpet with wood, tile, or vinyl flooring. Carpet can trap dander and dust. Do not smoke. Do not allow smoking in your home Change your heating and air conditioning filters at least once a month. If you have seasonal allergies, take these steps during allergy season: Keep windows closed as much as possible. Plan outdoor activities when pollen counts are lowest. Check pollen counts before you plan outdoor activities When coming indoors, change clothing and shower before sitting on furniture or bedding. If you have a pet in the house that produces  allergens: Keep the pet out of the bedroom. Vacuum, sweep, and dust regularly. General instructions Take over-the-counter and prescription medicines only as told by your provider. Drink enough fluid to keep your pee pale yellow. Where to find more information American Academy of Allergy, Asthma & Immunology: aaaai.org Contact a health care provider if: You have a fever. You develop a cough that does not go away. You make high-pitched whistling sounds when you breathe, most often when you breathe out (wheeze). Your symptoms slow you down or stop you from doing your normal activities each day. Get help right away if: You have shortness of breath. This symptom may be an emergency. Get help right away. Call 911. Do not wait to see if the symptoms will go away. Do not drive yourself to the hospital. This information is not intended to replace advice given to you by your health care provider. Make sure you discuss any questions you have with your health care provider. Document Revised: 08/06/2022 Document Reviewed: 08/06/2022 Elsevier Patient Education  2024  [employer].

## 2024-02-25 NOTE — Progress Notes (Signed)
 Subjective:    Patient ID: Ryan Glass, male    DOB: October 02, 1956, 68 y.o.   MRN: [id number]  67y/o married [place] male established patient here for fatigue especially after work and nasal/sinus congestion.  Spouse sick with cold last week was given antibiotics and finally improving this week.  He noticed after working outside in yard this weekend symptoms worse.  Has been using flonase, nasal saline and OTC antihistamine.  Still having post nasal drip that elicits cough.  Mucous clear yellow.  Denied fever/chills/loss of taste/smell, body aches, n/v/d.  Used his albuterol inhaler after work yesterday and this am.  Taking singulair at bedtime.  Took lasix yesterday and this am uses prn only with leg swelling/weight gain.  Had not used lasix last week.      Review of Systems  Constitutional:  Positive for fatigue. Negative for chills, diaphoresis and fever.  HENT:  Positive for congestion, postnasal drip, rhinorrhea, sinus pressure, sinus pain and sore throat. Negative for dental problem, drooling, ear discharge, ear pain, facial swelling, hearing loss, mouth sores, sneezing, tinnitus, trouble swallowing and voice change.   Eyes:  Positive for itching. Negative for photophobia and visual disturbance.  Respiratory:  Positive for cough and shortness of breath. Negative for choking, chest tightness, wheezing and stridor.   Cardiovascular:  Negative for chest pain and palpitations.  Gastrointestinal:  Negative for abdominal pain, diarrhea, nausea and vomiting.  Genitourinary:  Negative for difficulty urinating.  Musculoskeletal:  Negative for back pain, gait problem, neck pain and neck stiffness.  Skin:  Negative for color change and rash.  Allergic/Immunologic: Positive for environmental allergies.  Neurological:  Negative for dizziness, tremors, seizures, syncope, facial asymmetry, speech difficulty, weakness, light-headedness and numbness.  Hematological:  Negative for adenopathy. Does not  bruise/bleed easily.  Psychiatric/Behavioral:  Negative for agitation, confusion and sleep disturbance.        Objective:   Physical Exam Vitals and nursing note reviewed.  Constitutional:      General: He is awake. He is not in acute distress.    Appearance: Normal appearance. He is well-developed, well-groomed and overweight. He is ill-appearing. He is not toxic-appearing or diaphoretic.  HENT:     Head: Normocephalic and atraumatic.     Jaw: There is normal jaw occlusion.     Salivary Glands: Right salivary gland is not diffusely enlarged or tender. Left salivary gland is not diffusely enlarged or tender.     Right Ear: Hearing and external ear normal. No decreased hearing noted. No laceration, drainage, swelling or tenderness. A middle ear effusion is present. There is no impacted cerumen. No foreign body. No mastoid tenderness. No PE tube. No hemotympanum. Tympanic membrane is not injected, scarred or perforated.     Left Ear: Hearing and external ear normal. No decreased hearing noted. No laceration, drainage, swelling or tenderness. A middle ear effusion is present. There is no impacted cerumen. No foreign body. No mastoid tenderness. No PE tube. No hemotympanum. Tympanic membrane is not injected, scarred or perforated.     Ears:     Comments: Bilateral TMs intact air fluid level clear; no debris bilateral auditory canals    Nose: Mucosal edema and congestion present. No laceration, nasal tenderness or rhinorrhea.     Right Nostril: No epistaxis.     Left Nostril: No epistaxis.     Right Turbinates: Enlarged and swollen. Not pale.     Left Turbinates: Enlarged and swollen. Not pale.     Right Sinus: No  maxillary sinus tenderness or frontal sinus tenderness.     Left Sinus: No maxillary sinus tenderness or frontal sinus tenderness.     Comments: Bilateral nasal turbinates edema erythema clear discharge; cobblestoning posterior pharynx; bilateral allergic shiners ;lower eyelids  nonpitting edema 2+/4; NASAL CONGESTION audible in exam room and nasal sniffing    Mouth/Throat:     Lips: Pink. No lesions.     Mouth: Mucous membranes are moist. No oral lesions or angioedema.     Dentition: Abnormal dentition. Has dentures. No gum lesions.     Tongue: No lesions. Tongue does not deviate from midline.     Palate: No mass and lesions.     Pharynx: Uvula midline. Pharyngeal swelling, posterior oropharyngeal erythema and postnasal drip present. No oropharyngeal exudate or uvula swelling.     Tonsils: No tonsillar exudate.  Eyes:     General: Lids are normal. Vision grossly intact. Gaze aligned appropriately. Allergic shiner present. No scleral icterus.       Right eye: No discharge.        Left eye: No discharge.     Extraocular Movements: Extraocular movements intact.     Conjunctiva/sclera: Conjunctivae normal.     Pupils: Pupils are equal, round, and reactive to light.  Neck:     Trachea: Trachea and phonation normal.  Cardiovascular:     Rate and Rhythm: Normal rate and regular rhythm.     Pulses: Normal pulses.          Radial pulses are 2+ on the right side and 2+ on the left side.     Heart sounds: Normal heart sounds.  Pulmonary:     Effort: Pulmonary effort is normal. No respiratory distress.     Breath sounds: Normal breath sounds and air entry. No stridor or transmitted upper airway sounds. No wheezing, rhonchi or rales.     Comments: Spoke full sentences without difficulty; no cough observed in exam room Abdominal:     General: Abdomen is flat.     Palpations: Abdomen is soft.  Musculoskeletal:        General: No tenderness, deformity or signs of injury.     Right hand: Normal strength. Normal capillary refill.     Left hand: Normal strength. Normal capillary refill.     Cervical back: Neck supple. No swelling, edema, deformity, erythema, signs of trauma, lacerations, rigidity, spasms, torticollis, tenderness, bony tenderness or crepitus. No pain with  movement or muscular tenderness. Decreased range of motion.     Thoracic back: No swelling, edema, deformity, signs of trauma, lacerations, spasms or tenderness. Decreased range of motion.     Lumbar back: No swelling, edema, deformity, signs of trauma, lacerations, spasms, tenderness or bony tenderness. Decreased range of motion.     Right lower leg: No edema.     Left lower leg: No edema.     Right ankle: No swelling.     Left ankle: No swelling.     Comments: Chronically decreased AROM c/t/l-spine at baseline  Lymphadenopathy:     Head:     Right side of head: No submental, submandibular, tonsillar, preauricular, posterior auricular or occipital adenopathy.     Left side of head: No submental, submandibular, tonsillar, preauricular, posterior auricular or occipital adenopathy.     Cervical: No cervical adenopathy.     Right cervical: No superficial, deep or posterior cervical adenopathy.    Left cervical: No superficial, deep or posterior cervical adenopathy.  Skin:    General: Skin is  warm and dry.     Capillary Refill: Capillary refill takes less than 2 seconds.     Coloration: Skin is not ashen, cyanotic, jaundiced, mottled, pale or sallow.     Findings: No abrasion, abscess, acne, bruising, burn, ecchymosis, erythema, signs of injury, laceration, lesion, petechiae, rash or wound.     Nails: There is no clubbing.     Comments: Face/neck/hands visually inspected  Neurological:     General: No focal deficit present.     Mental Status: He is alert and oriented to person, place, and time. Mental status is at baseline.     GCS: GCS eye subscore is 4. GCS verbal subscore is 5. GCS motor subscore is 6.     Cranial Nerves: No cranial nerve deficit, dysarthria or facial asymmetry.     Sensory: Sensation is intact.     Motor: Motor function is intact. No weakness, tremor, atrophy, abnormal muscle tone or seizure activity.     Coordination: Coordination is intact. Coordination normal.      Gait: Gait is intact. Gait normal.     Comments: In/out of chair without difficulty; gait sure and steady in clinic; bilateral hand grasp equal 5/5  Psychiatric:        Attention and Perception: Attention and perception normal.        Mood and Affect: Mood and affect normal.        Speech: Speech normal.        Behavior: Behavior normal. Behavior is cooperative.        Thought Content: Thought content normal.        Cognition and Memory: Cognition and memory normal.        Judgment: Judgment normal.     Home covid test results negative      Assessment & Plan:   A-acute rhinosinusitis, dyspnea on exertion   P-Patient given 1 free [place] govt home covid test to complete prior to returning to [employer].  Discussed spring allergy season has started tree/grass highest at this time and flu/rsv/adenovirus/covid still circulating in community also.  Patient up to date on covid 19 vaccine.  Discussed vasomotor rhinitis common due to freezing temperatures outside this past week.  Continue nasal saline 2 sprays each notril q2h wa prn congestion/dust/pollen exposure and flonase nasal 1 spray each nostril BID use. Cover nose/mouth when outside with scarf or mask.  Follow up re-evaluation if fever/chills/dyspnea/wheezing/positive covid test.  Notify clinic staff via my chart or email [email or web address] if positive covid test and stay home.  [facility] handout allergic rhinitis, sinus rinse Patient verbalized understanding information/instructions, agreed with plan of care and had no further questions at this time.  Discussed with patient post nasal drip most likely combination viral illness and allergies since spouse sick and having fatigue also.  BBS CTA and sp02 stable in clinic today.  Continue albuterol inhaler 2p po q4-6h prn protracted coughing/chest tightness/shortness of breath.  Lasix 40mg  if weight gain greater than X lbs or lower leg swelling per cardiology prn use-continue daily am weight  checks.  Take potassium chloride if using lasix also.  Ensure using advair 1 puff twice a day.  Discussed mask wear if outside for extended durations.  I recommended to avoid time outside this week due to allergy flare up.  Discussed shower after work/being outside. Follow up re-evaluation with PCM or clinic staff if new or worsening symptoms e.g. shortness of breath, mucous cloudy, fever, chills, wheezing.   Patient verbalized understanding information/instructions and had no  further questions at this time.

## 2024-03-12 ENCOUNTER — Telehealth: Payer: Self-pay | Admitting: Registered Nurse

## 2024-03-12 DIAGNOSIS — M0609 Rheumatoid arthritis without rheumatoid factor, multiple sites: Secondary | ICD-10-CM

## 2024-03-12 DIAGNOSIS — E7849 Other hyperlipidemia: Secondary | ICD-10-CM

## 2024-03-12 DIAGNOSIS — Z0000 Encounter for general adult medical examination without abnormal findings: Secondary | ICD-10-CM

## 2024-03-12 MED ORDER — ATORVASTATIN CALCIUM 40 MG PO TABS: 40.0000 mg | ORAL_TABLET | Freq: Every day | ORAL | 0 refills | Status: DC

## 2024-03-12 MED ORDER — PREDNISONE 10 MG PO TABS: 15.0000 mg | ORAL_TABLET | Freq: Every day | ORAL | 1 refills | Status: DC

## 2024-03-12 NOTE — Telephone Encounter (Signed)
 Patient requested refills from [facility] of magnesium, atorvastatin and prednisone.  Rheumatology increased dose to 15mg  po daily Dec 2024 per epic chart review.  Atorvastatin 40mg  po daily last fill 11/21/2023 90 day supply and prednisone at that time.  Last labs Apr 2024.  Patient notified to schedule fasting male executive panel with RN Olegario Messier with Hgba1c and next magnesium due Sep 2025.  Patient notified magnesium not available via [facility] and asked if he wanted it sent to mail order pharmacy and he stated he would contact [facility] pharmacy for refill there.  Patient agreed with plan of care and had no further questions at that time.  Patient to complete 2026 Be Well requirements with RN Olegario Messier next week also height/weight/blood pressure.

## 2024-03-19 ENCOUNTER — Other Ambulatory Visit

## 2024-03-24 ENCOUNTER — Other Ambulatory Visit

## 2024-03-26 ENCOUNTER — Other Ambulatory Visit: Payer: Self-pay | Admitting: Physician Assistant

## 2024-03-26 ENCOUNTER — Other Ambulatory Visit

## 2024-03-26 ENCOUNTER — Other Ambulatory Visit (HOSPITAL_COMMUNITY): Payer: Self-pay

## 2024-03-26 ENCOUNTER — Other Ambulatory Visit: Payer: Self-pay

## 2024-03-26 MED ORDER — ACETAMINOPHEN-CODEINE 300-30 MG PO TABS
1.0000 | ORAL_TABLET | Freq: Two times a day (BID) | ORAL | 3 refills | Status: DC | PRN
  Filled 2024-03-26: qty 60, 30d supply, fill #0
  Filled 2024-04-13 – 2024-04-28 (×2): qty 60, 30d supply, fill #1
  Filled 2024-05-30: qty 60, 30d supply, fill #2
  Filled 2024-07-01 – 2024-07-03 (×2): qty 60, 30d supply, fill #3

## 2024-03-26 MED ORDER — SILDENAFIL CITRATE 50 MG PO TABS
50.0000 mg | ORAL_TABLET | Freq: Every day | ORAL | 0 refills | Status: DC | PRN
  Filled 2024-03-26: qty 6, 6d supply, fill #0
  Filled 2024-08-28: qty 4, 4d supply, fill #1

## 2024-03-26 NOTE — Telephone Encounter (Signed)
 Pt of Dr Micael Adas. As this is not a Cardiac RX does Dr Micael Adas want to refill? Please advise.

## 2024-04-01 ENCOUNTER — Other Ambulatory Visit

## 2024-04-01 VITALS — BP 129/97 | Ht 68.5 in | Wt 242.0 lb

## 2024-04-01 DIAGNOSIS — Z0000 Encounter for general adult medical examination without abnormal findings: Secondary | ICD-10-CM

## 2024-04-01 DIAGNOSIS — R7989 Other specified abnormal findings of blood chemistry: Secondary | ICD-10-CM

## 2024-04-01 NOTE — Progress Notes (Signed)
 Be Well labs.

## 2024-04-02 ENCOUNTER — Encounter: Payer: Self-pay | Admitting: Registered Nurse

## 2024-04-02 LAB — CMP12+LP+TP+TSH+6AC+PSA+CBC…
ALT: 33 IU/L (ref 0–44)
AST: 26 IU/L (ref 0–40)
Albumin: 4.3 g/dL (ref 3.9–4.9)
Alkaline Phosphatase: 81 IU/L (ref 44–121)
BUN/Creatinine Ratio: 18 (ref 10–24)
BUN: 26 mg/dL (ref 8–27)
Basophils Absolute: 0 10*3/uL (ref 0.0–0.2)
Basos: 0 %
Bilirubin Total: 0.9 mg/dL (ref 0.0–1.2)
Calcium: 9.7 mg/dL (ref 8.6–10.2)
Chloride: 105 mmol/L (ref 96–106)
Chol/HDL Ratio: 3 ratio (ref 0.0–5.0)
Cholesterol, Total: 146 mg/dL (ref 100–199)
Creatinine, Ser: 1.45 mg/dL — ABNORMAL HIGH (ref 0.76–1.27)
EOS (ABSOLUTE): 0 10*3/uL (ref 0.0–0.4)
Eos: 0 %
Estimated CHD Risk: <0.5 times avg. (ref 0.0–1.0)
Free Thyroxine Index: 1.8 (ref 1.2–4.9)
GGT: 33 IU/L (ref 0–65)
Globulin, Total: 1.9 g/dL (ref 1.5–4.5)
Glucose: 84 mg/dL (ref 70–99)
HDL: 49 mg/dL (ref >39)
Hematocrit: 46.9 % (ref 37.5–51.0)
Hemoglobin: 15.4 g/dL (ref 13.0–17.7)
Immature Grans (Abs): 0 10*3/uL (ref 0.0–0.1)
Immature Granulocytes: 0 %
Iron: 155 ug/dL (ref 38–169)
LDH: 213 IU/L (ref 121–224)
LDL Chol Calc (NIH): 70 mg/dL (ref 0–99)
Lymphocytes Absolute: 2 10*3/uL (ref 0.7–3.1)
Lymphs: 26 %
MCH: 34 pg — ABNORMAL HIGH (ref 26.6–33.0)
MCHC: 32.8 g/dL (ref 31.5–35.7)
MCV: 104 fL — ABNORMAL HIGH (ref 79–97)
Monocytes Absolute: 0.5 10*3/uL (ref 0.1–0.9)
Monocytes: 6 %
Neutrophils Absolute: 5.1 10*3/uL (ref 1.4–7.0)
Neutrophils: 68 %
Phosphorus: 4 mg/dL (ref 2.8–4.1)
Platelets: 172 10*3/uL (ref 150–450)
Potassium: 4.2 mmol/L (ref 3.5–5.2)
Prostate Specific Ag, Serum: 1.4 ng/mL (ref 0.0–4.0)
RBC: 4.53 x10E6/uL (ref 4.14–5.80)
RDW: 12.8 % (ref 11.6–15.4)
Sodium: 141 mmol/L (ref 134–144)
T3 Uptake Ratio: 30 % (ref 24–39)
T4, Total: 5.9 ug/dL (ref 4.5–12.0)
TSH: 2.53 u[IU]/mL (ref 0.450–4.500)
Total Protein: 6.2 g/dL (ref 6.0–8.5)
Triglycerides: 161 mg/dL — ABNORMAL HIGH (ref 0–149)
Uric Acid: 8.1 mg/dL (ref 3.8–8.4)
VLDL Cholesterol Cal: 27 mg/dL (ref 5–40)
WBC: 7.6 10*3/uL (ref 3.4–10.8)
eGFR: 52 mL/min/{1.73_m2} — ABNORMAL LOW (ref >59)

## 2024-04-02 LAB — HEMOGLOBIN A1C
Est. average glucose Bld gHb Est-mCnc: 108 mg/dL
Hgb A1c MFr Bld: 5.4 % (ref 4.8–5.6)

## 2024-04-02 NOTE — Addendum Note (Signed)
 Addended by: Sherilyn Dings A on: 04/02/2024 03:46 PM   Modules accepted: Orders, Level of Service

## 2024-04-03 NOTE — Progress Notes (Signed)
 Patient signed BW 2026 ROI and tobacco attestation nonsmoker/nonvaper in previous 12 month BP 129/97 LDL 70 Hgba1c 5.4 weight 242 lbs BMI 36 previous year BP 118/82 LDL 60 A1c 5.5 weight 238lbs and BMI 36  HR Tonya notified met requirements for insurance discount starting 09 Sep 2024

## 2024-04-07 NOTE — Addendum Note (Signed)
 Addended by: Edra Govern D on: 04/07/2024 04:53 PM   Modules accepted: Orders

## 2024-04-07 NOTE — Progress Notes (Signed)
 Remote ICD transmission.

## 2024-04-14 ENCOUNTER — Other Ambulatory Visit: Payer: Self-pay

## 2024-04-14 ENCOUNTER — Other Ambulatory Visit (HOSPITAL_COMMUNITY): Payer: Self-pay

## 2024-04-28 ENCOUNTER — Other Ambulatory Visit: Payer: Self-pay | Admitting: Registered Nurse

## 2024-04-28 ENCOUNTER — Other Ambulatory Visit (HOSPITAL_COMMUNITY): Payer: Self-pay

## 2024-04-28 ENCOUNTER — Encounter: Payer: Self-pay | Admitting: Registered Nurse

## 2024-04-28 ENCOUNTER — Telehealth: Payer: Self-pay | Admitting: Registered Nurse

## 2024-04-28 DIAGNOSIS — E876 Hypokalemia: Secondary | ICD-10-CM

## 2024-04-28 DIAGNOSIS — J4521 Mild intermittent asthma with (acute) exacerbation: Secondary | ICD-10-CM

## 2024-04-28 DIAGNOSIS — I10 Essential (primary) hypertension: Secondary | ICD-10-CM

## 2024-04-28 MED ORDER — FUROSEMIDE 40 MG PO TABS: ORAL_TABLET | ORAL | 2 refills | Status: DC

## 2024-04-28 MED ORDER — POTASSIUM CHLORIDE CRYS ER 20 MEQ PO TBCR: 20.0000 meq | EXTENDED_RELEASE_TABLET | Freq: Every day | ORAL | 2 refills | Status: AC | PRN

## 2024-04-28 NOTE — Telephone Encounter (Signed)
 Patient last filled potassium chloride  20meq po daily prn 11/21/2023 and furosemide  40mg  po daily prn 3lb weight gain or leg swelling or 5lb per week gain 07/30/2023.  Patient reported he was taking old left over furosemide  but just noticed expiration date was September 2024  Discussed with patient it still has some effect but studies show that 1 year after expiration date if held in humidity and light and temperature controlled area may be 25% less effective.  Discussed 90 tabs furosemide  and 60 tabs potassium to patient today from [facility].  Discussed [facility] shipment expected in June to give his other 30 tab bottle to him.  Last labs 04/01/2024  Latest Reference Range & Units 04/01/24 08:15  Sodium 134 - 144 mmol/L 141  Potassium 3.5 - 5.2 mmol/L 4.2  Chloride 96 - 106 mmol/L 105  Glucose 70 - 99 mg/dL 84  BUN 8 - 27 mg/dL 26  Creatinine 7.84 - 6.96 mg/dL 2.95 (H)  Calcium  8.6 - 10.2 mg/dL 9.7  BUN/Creatinine Ratio 10 - 24  18  eGFR >59 mL/min/1.73 52 (L)  Phosphorus 2.8 - 4.1 mg/dL 4.0  Alkaline Phosphatase 44 - 121 IU/L 81  Albumin  3.9 - 4.9 g/dL 4.3  Uric Acid 3.8 - 8.4 mg/dL 8.1  AST 0 - 40 IU/L 26  ALT 0 - 44 IU/L 33  Total Protein 6.0 - 8.5 g/dL 6.2  Total Bilirubin 0.0 - 1.2 mg/dL 0.9  GGT 0 - 65 IU/L 33  Estimated CHD Risk 0.0 - 1.0 times avg.  < 0.5  LDH 121 - 224 IU/L 213  Total CHOL/HDL Ratio 0.0 - 5.0 ratio 3.0  Cholesterol, Total 100 - 199 mg/dL 284  HDL Cholesterol >13 mg/dL 49  Triglycerides 0 - 244 mg/dL 010 (H)  VLDL Cholesterol Cal 5 - 40 mg/dL 27  LDL Chol Calc (NIH) 0 - 99 mg/dL 70  Iron 38 - 272 ug/dL 536  Globulin, Total 1.5 - 4.5 g/dL 1.9  WBC 3.4 - 64.4 I34V4/QV 7.6  RBC 4.14 - 5.80 x10E6/uL 4.53  Hemoglobin 13.0 - 17.7 g/dL 95.6  HCT 38.7 - 56.4 % 46.9  MCV 79 - 97 fL 104 (H)  MCH 26.6 - 33.0 pg 34.0 (H)  MCHC 31.5 - 35.7 g/dL 33.2  RDW 95.1 - 88.4 % 12.8  Platelets 150 - 450 x10E3/uL 172  Neutrophils Not Estab. % 68  Immature Granulocytes Not Estab. % 0   NEUT# 1.4 - 7.0 x10E3/uL 5.1  Lymphs Abs 0.7 - 3.1 x10E3/uL 2.0  Monocytes Absolute 0.1 - 0.9 x10E3/uL 0.5  Basophils Absolute 0.0 - 0.2 x10E3/uL 0.0  Immature Grans (Abs) 0.0 - 0.1 x10E3/uL 0.0  Lymphs Not Estab. % 26  Monocytes Not Estab. % 6  Basos Not Estab. % 0  Eos Not Estab. % 0  EOS (ABSOLUTE) 0.0 - 0.4 x10E3/uL 0.0  Hemoglobin A1C 4.8 - 5.6 % 5.4  Est. average glucose Bld gHb Est-mCnc mg/dL 166  TSH 0.630 - 1.601 uIU/mL 2.530  Thyroxine (T4) 4.5 - 12.0 ug/dL 5.9  Free Thyroxine Index 1.2 - 4.9  1.8  T3 Uptake Ratio 24 - 39 % 30  Prostate Specific Ag, Serum 0.0 - 4.0 ng/mL 1.4  (H): Data is abnormally high (L): Data is abnormally low  Last BP 129/95 sick visit 02/25/2024

## 2024-04-29 ENCOUNTER — Encounter: Payer: Self-pay | Admitting: Registered Nurse

## 2024-04-29 ENCOUNTER — Other Ambulatory Visit (HOSPITAL_COMMUNITY): Payer: Self-pay

## 2024-04-29 ENCOUNTER — Other Ambulatory Visit: Payer: Self-pay

## 2024-04-29 MED ORDER — ALBUTEROL SULFATE HFA 108 (90 BASE) MCG/ACT IN AERS
2.0000 | INHALATION_SPRAY | RESPIRATORY_TRACT | 1 refills | Status: AC | PRN
  Filled 2024-04-29: qty 6.7, 17d supply, fill #0

## 2024-04-29 MED ORDER — COLCHICINE 0.6 MG PO TABS
0.6000 mg | ORAL_TABLET | Freq: Two times a day (BID) | ORAL | 1 refills | Status: DC | PRN
  Filled 2024-04-29 – 2024-12-21 (×4): qty 60, 30d supply, fill #0

## 2024-04-29 NOTE — Telephone Encounter (Signed)
 Patient uses albuterol  prn flare bronchospams/wheezing with URI viral or allergies.  Allergies flaring at this time. Seen in clinic yesterday for medication refill no cough or audible wheezing afternoon skin warm dry and pink respirations even and unlabored RA gait sure and steady  Electronic refill albuterol  90mcg 1-2 puffs po q4-6h prn protracted coughing/wheezing/chest tightness #1 RF1 to his pharmacy of choice

## 2024-05-08 ENCOUNTER — Other Ambulatory Visit (HOSPITAL_COMMUNITY): Payer: Self-pay

## 2024-05-08 MED ORDER — ALLOPURINOL 100 MG PO TABS
100.0000 mg | ORAL_TABLET | Freq: Every day | ORAL | 3 refills | Status: AC
  Filled 2024-05-08 – 2024-05-30 (×2): qty 90, 90d supply, fill #0
  Filled 2024-09-02: qty 90, 90d supply, fill #1
  Filled 2024-12-07 – 2024-12-21 (×3): qty 90, 90d supply, fill #0

## 2024-05-08 MED ORDER — PREDNISONE 5 MG PO TABS
5.0000 mg | ORAL_TABLET | Freq: Every day | ORAL | 1 refills | Status: AC
  Filled 2024-05-08: qty 90, 90d supply, fill #0

## 2024-05-08 MED ORDER — COLCHICINE 0.6 MG PO TABS
0.6000 mg | ORAL_TABLET | Freq: Two times a day (BID) | ORAL | 1 refills | Status: DC
  Filled 2024-05-30: qty 180, 90d supply, fill #0
  Filled 2024-09-02: qty 180, 90d supply, fill #1

## 2024-05-14 ENCOUNTER — Telehealth: Payer: Self-pay | Admitting: Registered Nurse

## 2024-05-14 ENCOUNTER — Encounter: Payer: Self-pay | Admitting: Registered Nurse

## 2024-05-14 DIAGNOSIS — M542 Cervicalgia: Secondary | ICD-10-CM

## 2024-05-14 NOTE — Telephone Encounter (Signed)
 Patient with tight bilateral trapezius and paraspinals cervical.  Stated pain with bilateral rotation new this week.  Denied arm weakness or pain radiating to arms/jaw.  Ergonomic evaluation desk has keyboard next to monitor not edge of desk and paperwork at edge of desk so holding arms out to use mouse and keyboard and monitor level below eye level. Discussed with patient to raise computer monitors 6 inches so neck not chronically flexed and move papers to side so able to use arm rests chair to support arms when using mouse/keyboard.  Patient to follow up for clinic appt if no improvement with ergonomic changes on desk.  Discussed hourly stretching neck/shoulders also e.g. pendulums/neck tucks/shoulder rolls/neck circles/arm circles pick one to do every hour and avoid hunching shoulders.  Patient agreed with plan of care and had no further questions at this time.  Nontender trapezius/paraspinals palpation no rash/bruising/swelling noted visual inspection.  Bilateral hand grasp equal 5/5

## 2024-05-20 ENCOUNTER — Other Ambulatory Visit (HOSPITAL_COMMUNITY): Payer: Self-pay

## 2024-05-22 ENCOUNTER — Other Ambulatory Visit (HOSPITAL_COMMUNITY): Payer: Self-pay

## 2024-05-25 ENCOUNTER — Ambulatory Visit: Payer: No Typology Code available for payment source

## 2024-05-25 DIAGNOSIS — I472 Ventricular tachycardia, unspecified: Secondary | ICD-10-CM | POA: Diagnosis not present

## 2024-05-25 DIAGNOSIS — Z95818 Presence of other cardiac implants and grafts: Secondary | ICD-10-CM | POA: Diagnosis not present

## 2024-05-25 DIAGNOSIS — I422 Other hypertrophic cardiomyopathy: Secondary | ICD-10-CM

## 2024-05-25 DIAGNOSIS — I4729 Other ventricular tachycardia: Secondary | ICD-10-CM | POA: Diagnosis not present

## 2024-05-25 LAB — CUP PACEART REMOTE DEVICE CHECK
Battery Remaining Longevity: 49 mo
Battery Remaining Percentage: 61 %
Battery Voltage: 2.96 V
Brady Statistic AP VP Percent: 65 %
Brady Statistic AP VS Percent: 31 %
Brady Statistic AS VP Percent: 3 %
Brady Statistic AS VS Percent: 1 %
Brady Statistic RA Percent Paced: 94 %
Brady Statistic RV Percent Paced: 68 %
Date Time Interrogation Session: 20250616021301
HighPow Impedance: 71 Ohm
Implantable Lead Connection Status: 753985
Implantable Lead Connection Status: 753985
Implantable Lead Implant Date: 20220829
Implantable Lead Implant Date: 20220829
Implantable Lead Location: 753859
Implantable Lead Location: 753860
Implantable Pulse Generator Implant Date: 20220829
Lead Channel Impedance Value: 360 Ohm
Lead Channel Impedance Value: 440 Ohm
Lead Channel Pacing Threshold Amplitude: 0.75 V
Lead Channel Pacing Threshold Amplitude: 1.25 V
Lead Channel Pacing Threshold Pulse Width: 0.5 ms
Lead Channel Pacing Threshold Pulse Width: 0.5 ms
Lead Channel Sensing Intrinsic Amplitude: 10.5 mV
Lead Channel Sensing Intrinsic Amplitude: 3.5 mV
Lead Channel Setting Pacing Amplitude: 2 V
Lead Channel Setting Pacing Amplitude: 2.5 V
Lead Channel Setting Pacing Pulse Width: 0.5 ms
Lead Channel Setting Sensing Sensitivity: 0.5 mV
Pulse Gen Serial Number: 810029878

## 2024-05-27 ENCOUNTER — Ambulatory Visit: Payer: Self-pay | Admitting: Internal Medicine

## 2024-05-28 ENCOUNTER — Ambulatory Visit: Admitting: Registered Nurse

## 2024-05-28 VITALS — BP 109/82 | HR 90 | Temp 97.3°F

## 2024-05-28 DIAGNOSIS — Z7901 Long term (current) use of anticoagulants: Secondary | ICD-10-CM

## 2024-05-28 DIAGNOSIS — R233 Spontaneous ecchymoses: Secondary | ICD-10-CM

## 2024-05-28 DIAGNOSIS — J069 Acute upper respiratory infection, unspecified: Secondary | ICD-10-CM

## 2024-05-28 DIAGNOSIS — M0609 Rheumatoid arthritis without rheumatoid factor, multiple sites: Secondary | ICD-10-CM

## 2024-05-28 MED ORDER — PREDNISONE 10 MG PO TABS: 10.0000 mg | ORAL_TABLET | Freq: Every day | ORAL | Status: AC

## 2024-05-28 MED ORDER — PREDNISONE 10 MG PO TABS: 5.0000 mg | ORAL_TABLET | Freq: Every day | ORAL | 0 refills | Status: DC

## 2024-05-28 NOTE — Progress Notes (Signed)
 C/O of runny nose, covid test completed

## 2024-05-28 NOTE — Progress Notes (Signed)
 Established Patient Office Visit  Subjective   Patient ID: Ryan Glass, male    DOB: 09-17-1956  Age: 68 y.o. MRN: [id number]  Chief Complaint  Patient presents with   fatigue, watery eyes, post nasal drip    Started this week worse fatigue today    68y/o married [place] male established here for evaluation watery eyes, fatigue, post nasal drip started this week.  Has not home covid tested.  OTC antihistamine, singulair , flonase , nasal saline not helping.  Spouse had emergency gallbladder surgery this past week and has been in the hospital.  Denied fever/chills/wheezing/shortness of breath/n/v/d.  Noticed his weight was up today so took lasix  but had not taken in the previous couple of days.  Sometimes has cough with post nasal drip.  Nothing really coming up.  Not using his albuterol  inhaler at this time.  Daughter's supposed to be coming to visit this weekend and wondering if he is infectious.  Has noticed more bruising on his arms since starting eliquis    Patient requested refill prednisone  last filled 03/12/24 #126 on 03/12/24 from [facility] and atorvastatin  40mg   po daily #90.  Last labs 04/01/24  last cardiology appt 01/14/24 and rheumatology 05/08/24      Review of Systems  Constitutional:  Positive for malaise/fatigue. Negative for chills, diaphoresis and fever.  HENT:  Positive for congestion, sinus pain and sore throat. Negative for ear discharge, ear pain, hearing loss, nosebleeds and tinnitus.   Eyes:  Positive for discharge.  Respiratory:  Positive for cough and sputum production. Negative for hemoptysis, shortness of breath, wheezing and stridor.   Cardiovascular:  Negative for chest pain, palpitations and leg swelling.  Gastrointestinal:  Negative for diarrhea, nausea and vomiting.  Musculoskeletal:  Negative for myalgias.  Skin:  Negative for itching and rash.  Neurological:  Negative for dizziness, tingling, tremors, speech change, weakness and headaches.  Endo/Heme/Allergies:   Positive for environmental allergies.      Objective:     BP 109/82 (BP Location: Left Arm, Patient Position: Sitting, Cuff Size: Normal)   Pulse 90   Temp (!) 97.3 F (36.3 C) (Tympanic)   SpO2 94%    Physical Exam Vitals and nursing note reviewed.  Constitutional:      General: He is not in acute distress.    Appearance: He is well-developed, well-groomed and overweight. He is ill-appearing. He is not toxic-appearing or diaphoretic.  HENT:     Head: Normocephalic and atraumatic.     Jaw: There is normal jaw occlusion.     Right Ear: Hearing, ear canal and external ear normal. No decreased hearing noted. No laceration, drainage, swelling or tenderness. A middle ear effusion is present. There is no impacted cerumen. No foreign body. No mastoid tenderness. No PE tube. No hemotympanum. Tympanic membrane is not injected, scarred, perforated, erythematous, retracted or bulging.     Left Ear: Hearing, ear canal and external ear normal. No decreased hearing noted. No laceration, drainage, swelling or tenderness. A middle ear effusion is present. There is no impacted cerumen. No foreign body. No mastoid tenderness. No PE tube. No hemotympanum. Tympanic membrane is not injected, scarred, perforated, erythematous, retracted or bulging.     Ears:     Comments: Bilateral TMs intact air fluid level clear; no debris noted auditory canals    Nose: Mucosal edema, congestion and rhinorrhea present. No laceration. Rhinorrhea is clear.     Right Nostril: No epistaxis.     Left Nostril: No epistaxis.  Right Turbinates: Enlarged and swollen. Not pale.     Left Turbinates: Enlarged and swollen. Not pale.     Right Sinus: No maxillary sinus tenderness or frontal sinus tenderness.     Left Sinus: No maxillary sinus tenderness or frontal sinus tenderness.     Comments: Bilateral nasal turbinates edema erythema clear discharge; cobblestoning posterior pharynx; bilateral allergic shiners; lower eyelids  nonpitting edema 2+/4; left greater than right clear discharge lateral eyes; audible nasal congestion and throat clearing in exam room    Mouth/Throat:     Lips: Pink. No lesions.     Mouth: Mucous membranes are moist. No oral lesions or angioedema.     Dentition: Abnormal dentition. Has dentures. No gum lesions.     Tongue: No lesions. Tongue does not deviate from midline.     Palate: No lesions.     Pharynx: Uvula midline. Pharyngeal swelling and postnasal drip present. No oropharyngeal exudate, posterior oropharyngeal erythema or uvula swelling.     Tonsils: No tonsillar exudate.   Eyes:     General: Lids are normal. Vision grossly intact. Gaze aligned appropriately. Allergic shiner present. No scleral icterus.       Right eye: Discharge present.        Left eye: Discharge present.    Extraocular Movements:     Right eye: Normal extraocular motion and no nystagmus.     Left eye: Normal extraocular motion and no nystagmus.     Conjunctiva/sclera: Conjunctivae normal.     Right eye: Right conjunctiva is not injected. No chemosis or hemorrhage.    Left eye: Left conjunctiva is not injected. No chemosis or hemorrhage.    Pupils: Pupils are equal, round, and reactive to light.   Neck:     Trachea: Trachea and phonation normal.   Cardiovascular:     Rate and Rhythm: Normal rate and regular rhythm.     Pulses: Normal pulses.          Radial pulses are 2+ on the right side and 2+ on the left side.     Heart sounds: Normal heart sounds.     Comments: Trace nonpitting edema bilateral lower extremities Pulmonary:     Effort: Pulmonary effort is normal. No respiratory distress.     Breath sounds: Normal air entry. No stridor. Examination of the right-lower field reveals decreased breath sounds. Examination of the left-lower field reveals decreased breath sounds. Decreased breath sounds present. No wheezing, rhonchi or rales.     Comments: Spoke full sentences in exam room; no cough; bases  slightly diminished bilaterally; no wheezing/rhonchi/rales ausculated Abdominal:     General: Bowel sounds are normal. There is no distension.     Palpations: Abdomen is soft.   Musculoskeletal:        General: Normal range of motion.     Right hand: Normal strength. Normal capillary refill.     Left hand: Normal strength. Normal capillary refill.     Cervical back: Normal range of motion and neck supple. No swelling, edema, deformity, erythema, signs of trauma, lacerations, rigidity, spasms, torticollis, tenderness or crepitus. No pain with movement. Normal range of motion.     Thoracic back: No swelling, edema, deformity, signs of trauma, lacerations, spasms or tenderness.     Right lower leg: Edema present.     Left lower leg: Edema present.  Lymphadenopathy:     Head:     Right side of head: No submental, submandibular, tonsillar, preauricular, posterior auricular or occipital adenopathy.  Left side of head: No submental, submandibular, tonsillar, preauricular, posterior auricular or occipital adenopathy.     Cervical: No cervical adenopathy.     Right cervical: No superficial, deep or posterior cervical adenopathy.    Left cervical: No superficial, deep or posterior cervical adenopathy.   Skin:    General: Skin is warm and dry.     Capillary Refill: Capillary refill takes less than 2 seconds.     Coloration: Skin is not ashen, cyanotic, jaundiced, mottled, pale or sallow.     Findings: Bruising and ecchymosis present. No abrasion, abscess, acne, burn, erythema, laceration, lesion, petechiae, rash or wound.     Nails: There is no clubbing.     Comments: Bilateral forearms nummular bruising/senile purpura? Increased from last office visit number on bilateral arms   Neurological:     General: No focal deficit present.     Mental Status: He is alert and oriented to person, place, and time. Mental status is at baseline.     GCS: GCS eye subscore is 4. GCS verbal subscore is 5. GCS  motor subscore is 6.     Cranial Nerves: Cranial nerves 2-12 are intact. No cranial nerve deficit, dysarthria or facial asymmetry.     Sensory: Sensation is intact. No sensory deficit.     Motor: No weakness, tremor, atrophy, abnormal muscle tone or seizure activity.     Coordination: Coordination normal.     Gait: Gait normal.     Comments: In/out of chair without difficulty; gait sure and steady in clinic; bilateral hand grasp equal  Psychiatric:        Attention and Perception: Attention and perception normal.        Mood and Affect: Mood and affect normal.        Speech: Speech normal.        Behavior: Behavior normal. Behavior is cooperative.        Thought Content: Thought content normal.        Cognition and Memory: Cognition and memory normal.        Judgment: Judgment normal.   Home covid test results negative.   Latest Reference Range & Units 04/01/24 08:15  Sodium 134 - 144 mmol/L 141  Potassium 3.5 - 5.2 mmol/L 4.2  Chloride 96 - 106 mmol/L 105  Glucose 70 - 99 mg/dL 84  BUN 8 - 27 mg/dL 26  Creatinine 4.09 - 8.11 mg/dL 9.14 (H)  Calcium  8.6 - 10.2 mg/dL 9.7  BUN/Creatinine Ratio 10 - 24  18  eGFR >59 mL/min/1.73 52 (L)  Phosphorus 2.8 - 4.1 mg/dL 4.0  Alkaline Phosphatase 44 - 121 IU/L 81  Albumin  3.9 - 4.9 g/dL 4.3  Uric Acid 3.8 - 8.4 mg/dL 8.1  AST 0 - 40 IU/L 26  ALT 0 - 44 IU/L 33  Total Protein 6.0 - 8.5 g/dL 6.2  Total Bilirubin 0.0 - 1.2 mg/dL 0.9  GGT 0 - 65 IU/L 33  Estimated CHD Risk 0.0 - 1.0 times avg.  < 0.5  LDH 121 - 224 IU/L 213  Total CHOL/HDL Ratio 0.0 - 5.0 ratio 3.0  Cholesterol, Total 100 - 199 mg/dL 782  HDL Cholesterol >95 mg/dL 49  Triglycerides 0 - 621 mg/dL 308 (H)  VLDL Cholesterol Cal 5 - 40 mg/dL 27  LDL Chol Calc (NIH) 0 - 99 mg/dL 70  Iron 38 - 657 ug/dL 846  (H): Data is abnormally high (L): Data is abnormally low No results found for any visits on 05/28/24.  The 10-year ASCVD risk score (Arnett DK, et al., 2019) is:  11.8%    Assessment & Plan:   Problem List Items Addressed This Visit   None Visit Diagnoses       Viral URI    -  Primary     Discxussed common cold has been circulating in local area and typically covid/flu negative.  Symptoms lasting 10-14 days clear runny nose mucous may turn yellow then green at end.  Use otc cough drops.  He has albuterol  inhaler at home for prn use 2p po q4-6h.  Continue his daily controller inhaler as per pulmonology.  Given 1 free [place]  gov[place] home covid test to complete prior to returning to [facility].  Patient to continue oral antihistamine of preference loratadine  10mg  po daily.  May increase prednisone  to 15 mg po daily with breakfast x 4 days to help with sinusitis #21 RF0 dispensed to patient from [facility]  Increase nasal saline frequent 2 sprays q2h wa.  Flonase  nasal 50mcg 1 spray each nostril BID   Patient denied personal or family history of ENT cancer.  OTC antihistamine of choice claritin /zyrtec 10mg  po daily.  Avoid triggers if possible.  Shower prior to bedtime if exposed to triggers.  If allergic dust/dust mites recommend mattress/pillow covers/encasements; washing linens, vacuuming, sweeping, dusting weekly.  Call or return to clinic as needed if these symptoms worsen or fail to improve as anticipated.   Exitcare handout on viral URI and sinus rinse given to patient.  Patient verbalized understanding of instructions, agreed with plan of care and had no further questions at this time.  P2:  Avoidance and hand washing.   Recently saw rheumatology and prednisone  decreased to 5mg .  Patient requested [facility] refill dispensed prednisone  10mg  sig t1/2 tab po daily #42 RF0  Discussed with patient last rheumatology note recommended decrease dose to 5mg  daily again.  Patient verbalized understanding information/instructions and had no further questions at that time.  Continue atorvastatin  40mg  po daily #90 RF0 dispensed from [facility] to patient today keep flollow up with [facility] and  cardiology.  Patient agreed with plan of care and had no further questions at this time.  Return if symptoms worsen or fail to improve.    Richardine Chancy, NP

## 2024-05-28 NOTE — Patient Instructions (Signed)
 Viral Respiratory Infection A respiratory infection is an illness that affects part of the respiratory system, such as the lungs, nose, or throat. A respiratory infection that is caused by a virus is called a viral respiratory infection. Common types of viral respiratory infections include: A cold. The flu (influenza). A respiratory syncytial virus (RSV) infection. What are the causes? This condition is caused by a virus. The virus may spread through contact with droplets or direct contact with infected people or their mucus or secretions. The virus may spread from person to person (is contagious). What are the signs or symptoms? Symptoms of this condition include: A stuffy or runny nose. A sore throat or cough. Shortness of breath or difficulty breathing. Yellow or green mucus (sputum). Other symptoms may include: A fever. Sweating or chills. Fatigue. Achy muscles. A headache. How is this diagnosed? This condition may be diagnosed based on: Your symptoms. A physical exam. Testing of secretions from the nose or throat. Chest X-ray. How is this treated? This condition may be treated with medicines, such as: Antiviral medicine. This may shorten the length of time a person has symptoms. Expectorants. These make it easier to cough up mucus. Decongestant nasal sprays. Acetaminophen  or NSAIDs, such as ibuprofen, to relieve fever and pain. Antibiotic medicines are not prescribed for viral infections.This is because antibiotics are designed to kill bacteria. They do not kill viruses. Follow these instructions at home: Managing pain and congestion Take over-the-counter and prescription medicines only as told by your health care provider. If you have a sore throat, gargle with a mixture of salt and water  3-4 times a day or as needed. To make salt water , completely dissolve -1 tsp (3-6 g) of salt in 1 cup (237 mL) of warm water . Use nose drops made from salt water  to ease congestion and  soften raw skin around your nose. Take 2 tsp (10 mL) of honey at bedtime to lessen coughing at night. Do not give honey to children who are younger than 1 year. Drink enough fluid to keep your urine pale yellow. This helps prevent dehydration and helps loosen up mucus. General instructions  Rest as much as possible. Do not drink alcohol. Do not use any products that contain nicotine or tobacco. These products include cigarettes, chewing tobacco, and vaping devices, such as e-cigarettes. If you need help quitting, ask your health care provider. Keep all follow-up visits. This is important. How is this prevented?     Get an annual flu shot. You may get the flu shot in late summer, fall, or winter. Ask your health care provider when you should get your flu shot. Avoid spreading your infection to other people. If you are sick: Wash your hands with soap and water  often, especially after you cough or sneeze. Wash for at least 20 seconds. If soap and water  are not available, use alcohol-based hand sanitizer. Cover your mouth when you cough. Cover your nose and mouth when you sneeze. Do not share cups or eating utensils. Clean commonly used objects often. Clean commonly touched surfaces. Stay home from work or school as told by your health care provider. Avoid contact with people who are sick during cold and flu season. This is generally fall and winter. Contact a health care provider if: Your symptoms last for 10 days or longer. Your symptoms get worse over time. You have severe sinus pain in your face or forehead. The glands in your jaw or neck become very swollen. You have shortness of breath. Get  help right away if you: Feel pain or pressure in your chest. Have trouble breathing. Faint or feel like you will faint. Have severe and persistent vomiting. Feel confused or disoriented. These symptoms may represent a serious problem that is an emergency. Do not wait to see if the symptoms will  go away. Get medical help right away. Call your local emergency services (911 in the [place] Do not drive yourself to the hospital. Summary A respiratory infection is an illness that affects part of the respiratory system, such as the lungs, nose, or throat. A respiratory infection that is caused by a virus is called a viral respiratory infection. Common types of viral respiratory infections include a cold, influenza, and respiratory syncytial virus (RSV) infection. Symptoms of this condition include a stuffy or runny nose, cough, fatigue, achy muscles, sore throat, and fevers or chills. Antibiotic medicines are not prescribed for viral infections. This is because antibiotics are designed to kill bacteria. They are not effective against viruses. This information is not intended to replace advice given to you by your health care provider. Make sure you discuss any questions you have with your health care provider. Document Revised: 03/02/2021 Document Reviewed: 03/02/2021 Elsevier Patient Education  2024 [employer]How to Perform a Sinus Rinse A sinus rinse is a home treatment that is used to rinse your sinuses with a germ-free (sterile) mixture of salt and water  (saline solution). Sinuses are air-filled spaces in your skull that are behind the bones of your face and forehead. They open into your nasal cavity. A sinus rinse can help to clear mucus, dirt, dust, or pollen from your nasal cavity. You may do a sinus rinse when you have a cold, a virus, nasal allergy symptoms, a sinus infection, or stuffiness in your nose or sinuses. What are the risks? A sinus rinse is generally safe and effective. However, there are a few risks, which include: A burning sensation in your sinuses. This may happen if you do not make the saline solution as directed. Be sure to follow all directions when making the saline solution. Nasal irritation. Infection. This may be from unclean supplies or from contaminated water .  Infection from contaminated water  is rare, but possible. Do not do a sinus rinse if you have had ear or nasal surgery, ear infection, or plugged ears, unless recommended by your health care provider. Supplies needed: Saline solution or powder. Distilled or sterile water  to mix with saline powder. You may use boiled and cooled tap water . Boil tap water  for 5 minutes; cool until it is lukewarm. Use within 24 hours. Do not use regular tap water  to mix with the saline solution. Neti pot or nasal rinse bottle. These supplies release the saline solution into your nose and through your sinuses. Neti pots and nasal rinse bottles can be purchased at [employer], a health food store, or online. How to perform a sinus rinse  Wash your hands with soap and water  for at least 20 seconds. If soap and water  are not available, use hand sanitizer. Wash your device according to the directions that came with the product and then dry it. Use the solution that comes with your product or one that is sold separately in stores. Follow the mixing directions on the package to mix with sterile or distilled water . Fill the device with the amount of saline solution noted in the device instructions. Stand by a sink and tilt your head sideways over the sink. Place the spout of the device  in your upper nostril (the one closer to the ceiling). Gently pour or squeeze the saline solution into your nasal cavity. The liquid should drain out from the lower nostril if you are not too congested. While rinsing, breathe through your open mouth. Gently blow your nose to clear any mucus and rinse solution. Blowing too hard may cause ear pain. Turn your head in the other direction and repeat in your other nostril. Clean and rinse your device with clean water  and then air-dry it. Talk with your health care provider or pharmacist if you have questions about how to do a sinus rinse. Summary A sinus rinse is a home treatment that is  used to rinse your sinuses with a sterile mixture of salt and water  (saline solution). You may do a sinus rinse when you have a cold, a virus, nasal allergy symptoms, a sinus infection, or stuffiness in your nose or sinuses. A sinus rinse is generally safe and effective. Follow all instructions carefully. This information is not intended to replace advice given to you by your health care provider. Make sure you discuss any questions you have with your health care provider. Document Revised: 05/15/2021 Document Reviewed: 05/15/2021 Elsevier Patient Education  2024 [employer].

## 2024-05-30 ENCOUNTER — Other Ambulatory Visit: Payer: Self-pay | Admitting: Cardiology

## 2024-05-30 ENCOUNTER — Other Ambulatory Visit: Payer: Self-pay | Admitting: Physician Assistant

## 2024-05-30 ENCOUNTER — Other Ambulatory Visit (HOSPITAL_COMMUNITY): Payer: Self-pay

## 2024-05-30 DIAGNOSIS — I480 Paroxysmal atrial fibrillation: Secondary | ICD-10-CM

## 2024-05-31 MED ORDER — METOPROLOL SUCCINATE ER 50 MG PO TB24
50.0000 mg | ORAL_TABLET | Freq: Every day | ORAL | 3 refills | Status: AC
  Filled 2024-05-31: qty 90, 90d supply, fill #0
  Filled 2024-09-02: qty 90, 90d supply, fill #1
  Filled 2024-11-29 – 2024-11-30 (×2): qty 90, 90d supply, fill #0

## 2024-06-01 ENCOUNTER — Other Ambulatory Visit: Payer: Self-pay

## 2024-06-01 ENCOUNTER — Other Ambulatory Visit (HOSPITAL_COMMUNITY): Payer: Self-pay

## 2024-06-01 MED ORDER — GABAPENTIN 300 MG PO CAPS
300.0000 mg | ORAL_CAPSULE | Freq: Three times a day (TID) | ORAL | 3 refills | Status: DC
  Filled 2024-06-01: qty 90, 30d supply, fill #0
  Filled 2024-07-16: qty 90, 30d supply, fill #1
  Filled 2024-08-28: qty 90, 30d supply, fill #2
  Filled 2024-09-29: qty 90, 30d supply, fill #3

## 2024-06-01 MED ORDER — APIXABAN 5 MG PO TABS
5.0000 mg | ORAL_TABLET | Freq: Two times a day (BID) | ORAL | 1 refills | Status: DC
  Filled 2024-06-01: qty 180, 90d supply, fill #0
  Filled 2024-09-02: qty 180, 90d supply, fill #1

## 2024-06-01 NOTE — Telephone Encounter (Signed)
 Prescription refill request for Eliquis  received. Indication:afib Last office visit:2/25 Scr:1.45 Age: 68 Weight:109.8  kg  Prescription refilled

## 2024-06-02 ENCOUNTER — Other Ambulatory Visit (HOSPITAL_COMMUNITY): Payer: Self-pay

## 2024-06-02 MED ORDER — HYDRALAZINE HCL 100 MG PO TABS
100.0000 mg | ORAL_TABLET | Freq: Three times a day (TID) | ORAL | 2 refills | Status: AC
  Filled 2024-06-02 – 2024-10-18 (×2): qty 270, 90d supply, fill #0

## 2024-06-03 ENCOUNTER — Other Ambulatory Visit: Payer: Self-pay

## 2024-06-09 ENCOUNTER — Other Ambulatory Visit: Payer: Self-pay | Admitting: Registered Nurse

## 2024-06-09 DIAGNOSIS — Z0000 Encounter for general adult medical examination without abnormal findings: Secondary | ICD-10-CM

## 2024-07-01 ENCOUNTER — Other Ambulatory Visit (HOSPITAL_COMMUNITY): Payer: Self-pay

## 2024-07-01 ENCOUNTER — Other Ambulatory Visit: Payer: Self-pay | Admitting: Physician Assistant

## 2024-07-02 ENCOUNTER — Other Ambulatory Visit (HOSPITAL_COMMUNITY): Payer: Self-pay

## 2024-07-02 MED ORDER — SOTALOL HCL 120 MG PO TABS
120.0000 mg | ORAL_TABLET | Freq: Two times a day (BID) | ORAL | 2 refills | Status: AC
  Filled 2024-07-02: qty 180, 90d supply, fill #0
  Filled 2024-09-04 – 2024-09-29 (×2): qty 180, 90d supply, fill #1
  Filled 2025-01-07: qty 180, 90d supply, fill #0

## 2024-07-02 NOTE — Progress Notes (Signed)
 Remote ICD transmission.

## 2024-07-03 ENCOUNTER — Telehealth: Payer: Self-pay | Admitting: Cardiology

## 2024-07-03 ENCOUNTER — Other Ambulatory Visit (HOSPITAL_COMMUNITY): Payer: Self-pay

## 2024-07-03 NOTE — Telephone Encounter (Signed)
 Pt c/o medication issue:  1. Name of Medication: metoprolol  succinate (TOPROL -XL) 50 MG 24 hr tablet  sotalol  (BETAPACE ) 120 MG tablet   2. How are you currently taking this medication (dosage and times per day)? -  3. Are you having a reaction (difficulty breathing--STAT)? -  4. What is your medication issue? Pt requesting cb to see if he is supposed to be taking both meds

## 2024-07-03 NOTE — Telephone Encounter (Signed)
 Will send to Dr. Talyor, his nurse, and pharmacist.

## 2024-07-07 NOTE — Telephone Encounter (Signed)
 This was addressed with Pharmacy team. Closing encounter

## 2024-07-17 ENCOUNTER — Other Ambulatory Visit (HOSPITAL_COMMUNITY): Payer: Self-pay

## 2024-07-28 ENCOUNTER — Ambulatory Visit (HOSPITAL_COMMUNITY)
Admission: RE | Admit: 2024-07-28 | Discharge: 2024-07-28 | Disposition: A | Discharge status: Home / Self Care | Payer: No Typology Code available for payment source | Source: Ambulatory Visit | Attending: Internal Medicine | Admitting: Internal Medicine

## 2024-07-28 ENCOUNTER — Encounter (HOSPITAL_COMMUNITY): Payer: Self-pay

## 2024-07-28 DIAGNOSIS — I7121 Aneurysm of the ascending aorta, without rupture: Secondary | ICD-10-CM | POA: Insufficient documentation

## 2024-07-28 MED ORDER — IOHEXOL 350 MG/ML SOLN
75.0000 mL | Freq: Once | INTRAVENOUS | Status: AC | PRN
  Administered 2024-07-28: 75 mL via INTRAVENOUS

## 2024-07-30 LAB — POCT I-STAT CREATININE: Creatinine, Ser: 1.6 mg/dL — ABNORMAL HIGH (ref 0.61–1.24)

## 2024-08-05 ENCOUNTER — Other Ambulatory Visit (HOSPITAL_COMMUNITY): Payer: Self-pay

## 2024-08-05 ENCOUNTER — Other Ambulatory Visit: Payer: Self-pay

## 2024-08-06 ENCOUNTER — Other Ambulatory Visit (HOSPITAL_COMMUNITY): Payer: Self-pay

## 2024-08-06 MED ORDER — ACETAMINOPHEN-CODEINE 300-30 MG PO TABS
1.0000 | ORAL_TABLET | Freq: Three times a day (TID) | ORAL | 3 refills | Status: DC | PRN
  Filled 2024-08-06: qty 60, 20d supply, fill #0

## 2024-08-06 MED ORDER — ACETAMINOPHEN-CODEINE 300-30 MG PO TABS
1.0000 | ORAL_TABLET | Freq: Three times a day (TID) | ORAL | 3 refills | Status: DC | PRN
  Filled 2024-08-06: qty 60, 20d supply, fill #0
  Filled 2024-08-28: qty 60, 20d supply, fill #1
  Filled 2024-09-29: qty 60, 20d supply, fill #2
  Filled 2024-11-09: qty 60, 20d supply, fill #3

## 2024-08-10 ENCOUNTER — Ambulatory Visit: Payer: Self-pay | Admitting: Internal Medicine

## 2024-08-13 ENCOUNTER — Telehealth: Payer: Self-pay | Admitting: Registered Nurse

## 2024-08-13 ENCOUNTER — Encounter: Payer: Self-pay | Admitting: Registered Nurse

## 2024-08-13 DIAGNOSIS — M0609 Rheumatoid arthritis without rheumatoid factor, multiple sites: Secondary | ICD-10-CM

## 2024-08-13 MED ORDER — PREDNISONE 10 MG PO TABS: 5.0000 mg | ORAL_TABLET | Freq: Every day | ORAL | 0 refills | Status: DC

## 2024-08-13 NOTE — Telephone Encounter (Signed)
 Patient last filled from [facility] prednisone  10mg  tab 05/28/24  Recently saw rheumatology and prednisone  decreased to 5mg . Patient requested [facility] refill dispensed prednisone  10mg  sig t1/2 tab po daily #42 RF0 Discussed with patient last rheumatology note recommended decrease dose to 5mg  daily again. Patient verbalized understanding information/instructions and had no further questions at that time.

## 2024-08-18 ENCOUNTER — Ambulatory Visit: Admitting: Registered Nurse

## 2024-08-18 VITALS — BP 138/89 | HR 94 | Temp 99.2°F | Resp 19

## 2024-08-18 DIAGNOSIS — J301 Allergic rhinitis due to pollen: Secondary | ICD-10-CM

## 2024-08-18 DIAGNOSIS — Z23 Encounter for immunization: Secondary | ICD-10-CM

## 2024-08-18 DIAGNOSIS — H00011 Hordeolum externum right upper eyelid: Secondary | ICD-10-CM

## 2024-08-18 MED ORDER — ERYTHROMYCIN 5 MG/GM OP OINT: 1.0000 | TOPICAL_OINTMENT | Freq: Two times a day (BID) | OPHTHALMIC | Status: AC

## 2024-08-18 MED ORDER — CARBOXYMETHYLCELLULOSE SOD PF 0.5 % OP SOLN: 1.0000 [drp] | Freq: Three times a day (TID) | OPHTHALMIC | Status: AC | PRN

## 2024-08-18 NOTE — Patient Instructions (Signed)
 Allergic Conjunctivitis, Adult Allergic conjunctivitis is inflammation of the conjunctiva. The conjunctiva is the thin, clear membrane that covers the white part of the eye and the inner surface of the eyelid. Allergies can affect this layer of the eye. In this condition: The blood vessels in the conjunctiva swell and become irritated. The eyes become red or pink and feel itchy. There is often a watery discharge from the eyes. Allergic conjunctivitis is not contagious. This means it cannot be spread from person to person. The condition can develop at any age and may be outgrown. What are the causes? This condition is caused by allergens. These are things that can cause an allergic reaction in some people. Common allergens include: Outdoor allergens, such as: Pollen, including pollen from grass and weeds. Mold spores. Car fumes. Pollution. Indoor allergens, such as: Dust. Smoke. Mold spores. Proteins in a pet's urine, saliva, or dander. Protein buildup on contact lenses. What increases the risk? You may be more likely to develop this condition if you have a family history of these things: Allergies. Conditions caused by being exposed to allergens, such as: Allergic rhinitis. This is an allergic reaction that affects the nose. Bronchial asthma. This condition affects the large airways in the lungs and makes breathing difficult. Atopic dermatitis (eczema). This is inflammation of the skin that is long-term (chronic). What are the signs or symptoms? Symptoms of this condition include eyes that are itchy, red, watery, or puffy. Your eyes may also: Sting or burn. Have clear fluid draining from them. Have thick mucus discharge and pain (vernal conjunctivitis). This happens in severe cases. How is this diagnosed? This condition may be diagnosed based on: Your medical history. A physical exam, including an eye exam. Tests of the fluid draining from your eyes to rule out other causes. Other  tests to confirm the diagnosis, including: Testing for allergies. The skin may be pricked with a tiny needle. The pricked area is then exposed to small amounts of allergens. Testing for other eye conditions. Tests may include: Blood tests. Tissue scrapings from your eyelid. The tissue is then checked under a microscope. How is this treated? Treatment for this condition may include: Using cold, wet cloths (cold compresses) to soothe itching and swelling. Washing your face and hair. Also, washing your clothes often to remove allergens. Using eye drops. These may be prescription or over-the-counter. You may need to try different types to see which one works best for you. Examples include: Eye drops that wash allergens out of the eyes (preservative-free artificial tears). Eye drops that block the allergic reaction (antihistamine). Eye drops that reduce swelling and irritation (anti-inflammatory). Steroid eye drops, which may be given if other treatments have not worked. Oral antihistamine medicines. These are medicines taken by mouth to lessen your allergic reaction. You may need these if eye drops do not help or are difficult to use. An air purifier at home and work. Wraparound sunglasses. This may help to decrease the amount of allergens reaching the eye. Not wearing contact lenses until symptoms improve, if the condition was caused by contact lenses. Change to daily wear disposable contact lenses, if possible. Follow these instructions at home: Eye care Apply a clean, cold compress to your eyes for 10-20 minutes, 3-4 times a day. Do not touch or rub your eyes. Do not wear contact lenses until the inflammation is gone. Wear glasses instead. Do not wear eye makeup until the inflammation is gone. General instructions Avoid known allergens whenever possible. Take or apply  over-the-counter and prescription medicines only as told by your health care provider. These include any eye drops. Drink  enough fluid to keep your urine pale yellow. Keep all follow-up visits. Contact a health care provider if: Your symptoms get worse or do not get better with treatment. You have mild eye pain. You become sensitive to light. You have spots or blisters on your eyes. You have a fever. Get help right away if: You have redness, swelling, or other symptoms in only one eye. Your vision is blurred or you have other vision changes. You have pus draining from your eyes. You have severe eye pain. Summary Allergic conjunctivitis is inflammation of the eye that is caused by allergens. It affects the clear membrane that covers the white part of the eye and the inner surface of the eyelid. It often causes eye itching, redness, and a watery discharge. Take or apply over-the-counter and prescription medicines only as told by your health care provider. These include eye drops. Do not touch or rub your eyes. Contact a health care provider if your symptoms get worse or do not get better with treatment. This information is not intended to replace advice given to you by your health care provider. Make sure you discuss any questions you have with your health care provider. Document Revised: 02/05/2022 Document Reviewed: 02/05/2022 Elsevier Patient Education  2024 [employer] Inc[employer]Stye A stye, also known as a hordeolum, is a bump that forms on an eyelid. It may look like a pimple next to the eyelash. A stye can form inside the eyelid (internal stye) or outside the eyelid (external stye). A stye can cause redness, swelling, and pain on the eyelid. Styes are very common. Anyone can get them at any age. They usually occur in just one eye at a time, but you may have more than one in either eye. What are the causes? A stye is caused by an infection. The infection is almost always caused by bacteria called Staphylococcus aureus. This is a common type of bacteria that lives on the skin. An internal stye may result from an  infected oil-producing gland inside the eyelid. An external stye may be caused by an infection at the base of the eyelash (hair follicle). What increases the risk? You are more likely to develop a stye if: You have had a stye before. You have any of these conditions: Red, itchy, inflamed eyelids (blepharitis). A skin condition such as seborrheic dermatitis or rosacea. High fat levels in your blood (lipids). Dry eyes. What are the signs or symptoms? The most common symptom of a stye is eyelid pain. Internal styes are more painful than external styes. Other symptoms may include: Painful swelling of your eyelid. A scratchy feeling in your eye. Tearing and redness of your eye. A pimple-like bump on the edge of the eyelid. Pus draining from the stye. How is this diagnosed? Your health care provider may be able to diagnose a stye just by examining your eye. The health care provider may also check to make sure: You do not have a fever or other signs of a more serious infection. The infection has not spread to other parts of your eye or areas around your eye. How is this treated? Most styes will clear up in a few days without treatment or with warm compresses applied to the area. You may need to use antibiotic drops or ointment to treat an infection. Sometimes, steroid drops or ointment are used in addition to antibiotics. In some cases,  your health care provider may give you a small steroid injection in the eyelid. If your stye does not heal with routine treatment, your health care provider may drain pus from the stye using a thin blade or needle. This may be done if the stye is large, causing a lot of pain, or affecting your vision. Follow these instructions at home: Take over-the-counter and prescription medicines only as told by your health care provider. This includes eye drops or ointments. If you were prescribed an antibiotic medicine, steroid medicine, or both, apply or use them as told by  your health care provider. Do not stop using the medicine even if your condition improves. Apply a warm, wet cloth (warm compress) to your eye for 5-10 minutes, 4 to 6 times a day. Clean the affected eyelid as directed by your health care provider. Do not wear contact lenses or eye makeup until your stye has healed and your health care provider says that it is safe. Do not try to pop or drain the stye. Do not rub your eye. Contact a health care provider if: You have chills or a fever. Your stye does not go away after several days. Your stye affects your vision. Your eyeball becomes swollen, red, or painful. Get help right away if: You have pain when moving your eye around. Summary A stye is a bump that forms on an eyelid. It may look like a pimple next to the eyelash. A stye can form inside the eyelid (internal stye) or outside the eyelid (external stye). A stye can cause redness, swelling, and pain on the eyelid. Your health care provider may be able to diagnose a stye just by examining your eye. Apply a warm, wet cloth (warm compress) to your eye for 5-10 minutes, 4 to 6 times a day. This information is not intended to replace advice given to you by your health care provider. Make sure you discuss any questions you have with your health care provider. Document Revised: 02/01/2021 Document Reviewed: 02/01/2021 Elsevier Patient Education  2024 [employer].

## 2024-08-19 ENCOUNTER — Encounter: Payer: Self-pay | Admitting: Registered Nurse

## 2024-08-19 NOTE — Progress Notes (Signed)
 Established Patient Office Visit  Subjective   Patient ID: Ryan Glass, male    DOB: March 04, 1956  Age: 68 y.o. MRN: [id number]  Chief Complaint  Patient presents with   eyelid pain and swelling right    Woke up this morning with it this way; used visine eye drops and it helped with redness of eye to go away    68y/o married [place] male here for evaluation swelling/redness right upper eyelid  Was normal yesterday woke up this way today.  Also had redness to eyes used visine and redness resolved but eyelid still swollen here for evaluation.  Denied eyes matted shut this am, headache, photophobia, visual changes or eye pain/trauma known.      Review of Systems  Constitutional:  Negative for chills, diaphoresis, fever and malaise/fatigue.  HENT:  Positive for congestion. Negative for ear discharge, ear pain, nosebleeds, sinus pain, sore throat and tinnitus.   Eyes:  Positive for redness. Negative for blurred vision, double vision, photophobia and pain.  Respiratory:  Negative for cough, hemoptysis, sputum production, shortness of breath, wheezing and stridor.   Cardiovascular:  Negative for chest pain.  Gastrointestinal:  Negative for diarrhea and vomiting.  Musculoskeletal:  Positive for neck pain.  Skin:  Positive for rash. Negative for itching.  Neurological:  Negative for dizziness, tingling, tremors, sensory change, speech change, focal weakness, seizures, loss of consciousness, weakness and headaches.  Endo/Heme/Allergies:  Positive for environmental allergies.      Objective:     BP 138/89 (BP Location: Left Arm, Patient Position: Sitting, Cuff Size: Normal)   Pulse 94   Temp 99.2 F (37.3 C) (Tympanic)   Resp 19   SpO2 100%    Physical Exam Vitals and nursing note reviewed.  Constitutional:      General: He is awake. He is not in acute distress.    Appearance: Normal appearance. He is well-developed and well-groomed. He is obese. He is not ill-appearing,  toxic-appearing or diaphoretic.  HENT:     Head: Normocephalic and atraumatic.     Jaw: There is normal jaw occlusion.     Salivary Glands: Right salivary gland is not diffusely enlarged or tender. Left salivary gland is not diffusely enlarged or tender.     Right Ear: Hearing and external ear normal. No decreased hearing noted. No laceration, drainage, swelling or tenderness. A middle ear effusion is present. There is no impacted cerumen. No foreign body. No mastoid tenderness. No PE tube. No hemotympanum. Tympanic membrane is not injected, scarred, perforated, erythematous or retracted.     Left Ear: Hearing and external ear normal. No decreased hearing noted. No laceration, drainage, swelling or tenderness. A middle ear effusion is present. There is no impacted cerumen. No foreign body. No mastoid tenderness. No PE tube. No hemotympanum. Tympanic membrane is not injected, scarred, perforated, erythematous or retracted.     Ears:     Comments: Bilateral TMs intact air fluid level clear;  no debris noted bilateral auditory canals    Nose: Mucosal edema present. No congestion or rhinorrhea.     Right Nostril: No epistaxis.     Left Nostril: No epistaxis.     Right Turbinates: Enlarged and swollen. Not pale.     Left Turbinates: Enlarged and swollen. Not pale.     Right Sinus: No maxillary sinus tenderness or frontal sinus tenderness.     Left Sinus: No maxillary sinus tenderness or frontal sinus tenderness.     Comments: Bilateral nasal turbinates edema  erythema    Mouth/Throat:     Lips: Pink. No lesions.     Mouth: Mucous membranes are moist. No oral lesions or angioedema.     Dentition: No gum lesions.     Tongue: No lesions. Tongue does not deviate from midline.     Palate: No mass and lesions.     Pharynx: Uvula midline. Pharyngeal swelling and postnasal drip present. No oropharyngeal exudate, posterior oropharyngeal erythema or uvula swelling.     Tonsils: No tonsillar exudate.      Comments: Cobblestoning posterior pharynx Eyes:     General: Lids are normal. Vision grossly intact. Gaze aligned appropriately. Allergic shiner present. No scleral icterus.       Right eye: No discharge.        Left eye: No discharge.     Extraocular Movements: Extraocular movements intact.     Conjunctiva/sclera: Conjunctivae normal.     Pupils: Pupils are equal, round, and reactive to light.      Comments: Nonpitting edema 1+/4 upper right eyelid; bilateral allergic shiners; bilateral lower eyelids nonpitting edema 2+/4; no debris noted bilateral eyelashes; sclera bulbar conjunctiva clear; eyelid conjunctiva lower eyelids 1+/4 injected  Neck:     Trachea: Trachea and phonation normal.  Cardiovascular:     Rate and Rhythm: Normal rate and regular rhythm.     Pulses: Normal pulses.          Radial pulses are 2+ on the right side and 2+ on the left side.  Pulmonary:     Effort: Pulmonary effort is normal. No respiratory distress.     Breath sounds: Normal breath sounds and air entry. No stridor or transmitted upper airway sounds. No decreased breath sounds, wheezing, rhonchi or rales.     Comments: Spoke full sentences without difficulty; no cough observed in exam room Abdominal:     General: There is no distension.     Palpations: Abdomen is soft.     Tenderness: There is no abdominal tenderness. There is no guarding.  Musculoskeletal:     Right hand: Normal strength. Normal capillary refill.     Left hand: Normal strength. Normal capillary refill.     Cervical back: Neck supple. No edema, erythema, signs of trauma, rigidity, torticollis, tenderness or crepitus. Decreased range of motion.     Thoracic back: No swelling, edema, signs of trauma, lacerations or tenderness.     Right lower leg: No edema.     Left lower leg: No edema.  Lymphadenopathy:     Head:     Right side of head: No submental, submandibular, tonsillar, preauricular, posterior auricular or occipital adenopathy.      Left side of head: No submental, submandibular, tonsillar, preauricular, posterior auricular or occipital adenopathy.     Cervical: No cervical adenopathy.     Right cervical: No superficial, deep or posterior cervical adenopathy.    Left cervical: No superficial, deep or posterior cervical adenopathy.  Skin:    General: Skin is warm and dry.     Capillary Refill: Capillary refill takes less than 2 seconds.     Coloration: Skin is not ashen, cyanotic, jaundiced, mottled, pale or sallow.     Findings: Erythema and rash present. No abrasion, abscess, acne, bruising, burn, ecchymosis, signs of injury, laceration, lesion, petechiae or wound. Rash is macular. Rash is not crusting, nodular, papular, purpuric, pustular, scaling, urticarial or vesicular.     Nails: There is no clubbing.     Comments: Right upper eyelid macular  erythema lash line  Neurological:     General: No focal deficit present.     Mental Status: He is alert and oriented to person, place, and time. Mental status is at baseline.     GCS: GCS eye subscore is 4. GCS verbal subscore is 5. GCS motor subscore is 6.     Cranial Nerves: No cranial nerve deficit, dysarthria or facial asymmetry.     Sensory: Sensation is intact.     Motor: Motor function is intact. No weakness, tremor, atrophy, abnormal muscle tone or seizure activity.     Coordination: Coordination is intact. Coordination normal.     Gait: Gait is intact. Gait normal.     Comments: In/out of chair without difficulty; gait sure and steady in clinic; bilateral hand grasp equal 5/5  Psychiatric:        Attention and Perception: Attention and perception normal.        Mood and Affect: Mood and affect normal.        Speech: Speech normal.        Behavior: Behavior normal. Behavior is cooperative.        Thought Content: Thought content normal.        Cognition and Memory: Cognition and memory normal.        Judgment: Judgment normal.      No results found for any  visits on 08/18/24.    The 10-year ASCVD risk score (Arnett DK, et al., 2019) is: 17.5%    Assessment & Plan:   Problem List Items Addressed This Visit   None Visit Diagnoses       Hordeolum externum of right upper eyelid    -  Primary     Hygiene discussed.  Shower after work and prior to bedtime.  If any drainage on sheets/pillowcase you may want to launder/change more often along with your bathroom towels.  I recommend washing in hot water  with bleach if you are having purulent eye discharge.  Consider using disposable wipes/towels on face.  Remove current contacts and recommended to wear glasses until swelling/irritation resolved. Patient to apply warm moist packs prn up to QID. Instructed patient to not rub eyes.  Do not squeeze or pop stye/lesion.  May use over the counter eye drops/tears for pain/symptom relief e.g refresh gtts 2 right eye TID x 7-14 days.  Erythromycin  ophthalmic ointment apply 1 cm right eye BID x 7 days #1 RF0 dispensed from [facility] to patient.  Return to clinic if headache, fever greater than 100.32F, nausea/vomiting, purulent discharge/matting unable to open eye without using fingers, whole eyelid swells up or spreads to other part of your face, stye bleeds, foreign body sensation, ciliary flush, worsening photophobia or vision. Call or return to clinic as needed if these symptoms worsen or fail to improve as anticipated. Patient given [facility] handout on hordeolum. If stye does not resolve within a couple weeks you may need to see optometrist/ophthalmologist to drain stye.  Typically a stye should get better and resolve in 1-2 weeks.  Patient verbalized agreement and understanding of treatment plan and had no further questions at this time. P2: Hand washing, avoid contact use-wear glasses.   Discussed also appears allergies flaring.  Increase nasal saline use given 1 neil sinus rinse bottle from clinic stock.  Continue singulair  10mg  po daily.  Shower after work and prior  to bed.  Avoid sleeping with windows open.  Discussed ragweed pollen counts increasing.  2 new variants of covid also circulating in  community.  Check with pharmacy and [facility] office if vaccine available as he meets booster criteria for this year.  Covid vaccines will not be available at [facility] this year.  Flu vaccines will be available onsite end of month.  Patient verbalized understanding information/instructions and had no further questions at this time.  Return if symptoms worsen or fail to improve.    Ellouise DELENA Hope, NP

## 2024-08-24 ENCOUNTER — Ambulatory Visit (INDEPENDENT_AMBULATORY_CARE_PROVIDER_SITE_OTHER): Payer: No Typology Code available for payment source

## 2024-08-24 DIAGNOSIS — I480 Paroxysmal atrial fibrillation: Secondary | ICD-10-CM | POA: Diagnosis not present

## 2024-08-25 LAB — CUP PACEART REMOTE DEVICE CHECK
Battery Remaining Longevity: 48 mo
Battery Remaining Percentage: 57 %
Battery Voltage: 2.96 V
Brady Statistic AP VP Percent: 61 %
Brady Statistic AP VS Percent: 35 %
Brady Statistic AS VP Percent: 2.4 %
Brady Statistic AS VS Percent: 1 %
Brady Statistic RA Percent Paced: 95 %
Brady Statistic RV Percent Paced: 64 %
Date Time Interrogation Session: 20250915024237
HighPow Impedance: 79 Ohm
Implantable Lead Connection Status: 753985
Implantable Lead Connection Status: 753985
Implantable Lead Implant Date: 20220829
Implantable Lead Implant Date: 20220829
Implantable Lead Location: 753859
Implantable Lead Location: 753860
Implantable Pulse Generator Implant Date: 20220829
Lead Channel Impedance Value: 410 Ohm
Lead Channel Impedance Value: 440 Ohm
Lead Channel Pacing Threshold Amplitude: 0.75 V
Lead Channel Pacing Threshold Amplitude: 1.25 V
Lead Channel Pacing Threshold Pulse Width: 0.5 ms
Lead Channel Pacing Threshold Pulse Width: 0.5 ms
Lead Channel Sensing Intrinsic Amplitude: 3.5 mV
Lead Channel Sensing Intrinsic Amplitude: 9.4 mV
Lead Channel Setting Pacing Amplitude: 2 V
Lead Channel Setting Pacing Amplitude: 2.5 V
Lead Channel Setting Pacing Pulse Width: 0.5 ms
Lead Channel Setting Sensing Sensitivity: 0.5 mV
Pulse Gen Serial Number: 810029878

## 2024-08-28 ENCOUNTER — Other Ambulatory Visit: Payer: Self-pay

## 2024-08-28 ENCOUNTER — Ambulatory Visit: Payer: Self-pay | Admitting: Internal Medicine

## 2024-08-28 ENCOUNTER — Other Ambulatory Visit (HOSPITAL_COMMUNITY): Payer: Self-pay

## 2024-08-29 NOTE — Progress Notes (Signed)
Remote ICD Transmission.

## 2024-09-04 ENCOUNTER — Other Ambulatory Visit (HOSPITAL_COMMUNITY): Payer: Self-pay

## 2024-09-22 ENCOUNTER — Encounter: Payer: Self-pay | Admitting: Registered Nurse

## 2024-09-22 ENCOUNTER — Ambulatory Visit: Payer: Self-pay | Admitting: Family Medicine

## 2024-09-22 ENCOUNTER — Telehealth: Payer: Self-pay | Admitting: Registered Nurse

## 2024-09-22 DIAGNOSIS — M0609 Rheumatoid arthritis without rheumatoid factor, multiple sites: Secondary | ICD-10-CM

## 2024-09-22 NOTE — Telephone Encounter (Signed)
 Patient last filled from [facility] prednisone  10mg  tab 08/13/24   saw rheumatology 05/08/24 and prednisone  decreased to 5mg . Patient requested [facility] refill dispensed prednisone  10mg  sig t1/2 tab po daily #42 RF0  Patient verbalized understanding information/instructions and had no further questions at that time.   Last labs   Latest Reference Range & Units 04/01/24 08:15 07/28/24 13:36  Sodium 134 - 144 mmol/L 141   Potassium 3.5 - 5.2 mmol/L 4.2   Chloride 96 - 106 mmol/L 105   Glucose 70 - 99 mg/dL 84   BUN 8 - 27 mg/dL 26   Creatinine 9.38 - 1.24 mg/dL 8.54 (H) 8.39 (H)  Calcium  8.6 - 10.2 mg/dL 9.7   BUN/Creatinine Ratio 10 - 24  18   eGFR >59 mL/min/1.73 52 (L)   Phosphorus 2.8 - 4.1 mg/dL 4.0   Alkaline Phosphatase 44 - 121 IU/L 81   Albumin  3.9 - 4.9 g/dL 4.3   Uric Acid 3.8 - 8.4 mg/dL 8.1   AST 0 - 40 IU/L 26   ALT 0 - 44 IU/L 33   Total Protein 6.0 - 8.5 g/dL 6.2   Total Bilirubin 0.0 - 1.2 mg/dL 0.9   GGT 0 - 65 IU/L 33   Estimated CHD Risk 0.0 - 1.0 times avg.  < 0.5   LDH 121 - 224 IU/L 213   Total CHOL/HDL Ratio 0.0 - 5.0 ratio 3.0   Cholesterol, Total 100 - 199 mg/dL 853   HDL Cholesterol >60 mg/dL 49   Triglycerides 0 - 149 mg/dL 838 (H)   VLDL Cholesterol Cal 5 - 40 mg/dL 27   LDL Chol Calc (NIH) 0 - 99 mg/dL 70   Iron 38 - 830 ug/dL 844   Globulin, Total 1.5 - 4.5 g/dL 1.9   WBC 3.4 - 89.1 k89Z6/lO 7.6   RBC 4.14 - 5.80 x10E6/uL 4.53   Hemoglobin 13.0 - 17.7 g/dL 84.5   HCT 62.4 - 48.9 % 46.9   MCV 79 - 97 fL 104 (H)   MCH 26.6 - 33.0 pg 34.0 (H)   MCHC 31.5 - 35.7 g/dL 67.1   RDW 88.3 - 84.5 % 12.8   Platelets 150 - 450 x10E3/uL 172   Neutrophils Not Estab. % 68   Immature Granulocytes Not Estab. % 0   NEUT# 1.4 - 7.0 x10E3/uL 5.1   Lymphs Abs 0.7 - 3.1 x10E3/uL 2.0   Monocytes Absolute 0.1 - 0.9 x10E3/uL 0.5   Basophils Absolute 0.0 - 0.2 x10E3/uL 0.0   Immature Grans (Abs) 0.0 - 0.1 x10E3/uL 0.0   Lymphs Not Estab. % 26   Monocytes Not Estab. % 6   Basos  Not Estab. % 0   Eos Not Estab. % 0   EOS (ABSOLUTE) 0.0 - 0.4 x10E3/uL 0.0   Hemoglobin A1C 4.8 - 5.6 % 5.4   Est. average glucose Bld gHb Est-mCnc mg/dL 891   TSH 9.549 - 5.499 uIU/mL 2.530   Thyroxine (T4) 4.5 - 12.0 ug/dL 5.9   Free Thyroxine Index 1.2 - 4.9  1.8   T3 Uptake Ratio 24 - 39 % 30   Prostate Specific Ag, Serum 0.0 - 4.0 ng/mL 1.4   (H): Data is abnormally high (L): Data is abnormally low  Last BP 08/18/24 139/89 HR 94

## 2024-09-29 ENCOUNTER — Telehealth: Payer: Self-pay | Admitting: Registered Nurse

## 2024-09-29 DIAGNOSIS — E7849 Other hyperlipidemia: Secondary | ICD-10-CM

## 2024-09-29 MED ORDER — ATORVASTATIN CALCIUM 40 MG PO TABS: 40.0000 mg | ORAL_TABLET | Freq: Every day | ORAL | 1 refills | Status: AC

## 2024-09-29 NOTE — Telephone Encounter (Signed)
 Last filled 90 tabs 05/28/24  last labs   Latest Reference Range & Units 04/01/24 08:15  Sodium 134 - 144 mmol/L 141  Potassium 3.5 - 5.2 mmol/L 4.2  Chloride 96 - 106 mmol/L 105  Glucose 70 - 99 mg/dL 84  BUN 8 - 27 mg/dL 26  Creatinine 9.23 - 8.72 mg/dL 8.54 (H)  Calcium  8.6 - 10.2 mg/dL 9.7  BUN/Creatinine Ratio 10 - 24  18  eGFR >59 mL/min/1.73 52 (L)  Phosphorus 2.8 - 4.1 mg/dL 4.0  Alkaline Phosphatase 44 - 121 IU/L 81  Albumin  3.9 - 4.9 g/dL 4.3  Uric Acid 3.8 - 8.4 mg/dL 8.1  AST 0 - 40 IU/L 26  ALT 0 - 44 IU/L 33  Total Protein 6.0 - 8.5 g/dL 6.2  Total Bilirubin 0.0 - 1.2 mg/dL 0.9  GGT 0 - 65 IU/L 33  Estimated CHD Risk 0.0 - 1.0 times avg.  < 0.5  LDH 121 - 224 IU/L 213  Total CHOL/HDL Ratio 0.0 - 5.0 ratio 3.0  Cholesterol, Total 100 - 199 mg/dL 853  HDL Cholesterol >60 mg/dL 49  Triglycerides 0 - 850 mg/dL 838 (H)  VLDL Cholesterol Cal 5 - 40 mg/dL 27  LDL Chol Calc (NIH) 0 - 99 mg/dL 70  Iron 38 - 830 ug/dL 844  Globulin, Total 1.5 - 4.5 g/dL 1.9  WBC 3.4 - 89.1 k89Z6/lO 7.6  RBC 4.14 - 5.80 x10E6/uL 4.53  Hemoglobin 13.0 - 17.7 g/dL 84.5  HCT 62.4 - 48.9 % 46.9  MCV 79 - 97 fL 104 (H)  MCH 26.6 - 33.0 pg 34.0 (H)  MCHC 31.5 - 35.7 g/dL 67.1  RDW 88.3 - 84.5 % 12.8  Platelets 150 - 450 x10E3/uL 172  Neutrophils Not Estab. % 68  Immature Granulocytes Not Estab. % 0  NEUT# 1.4 - 7.0 x10E3/uL 5.1  Lymphs Abs 0.7 - 3.1 x10E3/uL 2.0  Monocytes Absolute 0.1 - 0.9 x10E3/uL 0.5  Basophils Absolute 0.0 - 0.2 x10E3/uL 0.0  Immature Grans (Abs) 0.0 - 0.1 x10E3/uL 0.0  Lymphs Not Estab. % 26  Monocytes Not Estab. % 6  Basos Not Estab. % 0  Eos Not Estab. % 0  EOS (ABSOLUTE) 0.0 - 0.4 x10E3/uL 0.0  Hemoglobin A1C 4.8 - 5.6 % 5.4  Est. average glucose Bld gHb Est-mCnc mg/dL 891  TSH 9.549 - 5.499 uIU/mL 2.530  Thyroxine (T4) 4.5 - 12.0 ug/dL 5.9  Free Thyroxine Index 1.2 - 4.9  1.8  T3 Uptake Ratio 24 - 39 % 30  Prostate Specific Ag, Serum 0.0 - 4.0 ng/mL 1.4   (H): Data is abnormally high (L): Data is abnormally low  Dispensed 90 tabs to patient today from [facility].  Next labs due Apr 2026

## 2024-10-01 ENCOUNTER — Other Ambulatory Visit (HOSPITAL_COMMUNITY): Payer: Self-pay

## 2024-10-01 ENCOUNTER — Ambulatory Visit: Admitting: General Practice

## 2024-10-01 ENCOUNTER — Other Ambulatory Visit: Payer: Self-pay | Admitting: Registered Nurse

## 2024-10-01 DIAGNOSIS — Z23 Encounter for immunization: Secondary | ICD-10-CM

## 2024-10-01 NOTE — Progress Notes (Signed)
 Patient at high risk for severe covid.  Last immunization 10/09/23 covid.  Patient scheduled for 1530 today with RN Karene for vaccine.  Orders placed.  Flu vaccine was given last month per patient.

## 2024-10-02 ENCOUNTER — Other Ambulatory Visit (HOSPITAL_COMMUNITY): Payer: Self-pay

## 2024-10-05 NOTE — Progress Notes (Unsigned)
 Office Visit    Patient Name: Ryan Glass Date of Encounter: 10/07/2024  PCP:  Aletha Bene, MD    [facility]  Cardiologist:  Wilbert Bihari, MD  Advanced Practice Provider:  No care team member to display Electrophysiologist:  Danelle Birmingham, MD   HPI    Ryan Glass is a 68 y.o. male with a past medical history significant for PVCs status post PVC ablation with Dr. 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  He was last seen by Dr. Bihari 01/26/2022.  History includes hospitalization for chest pain 05/26/2021.  Found to have acute PE with RV failure mildly reduced LVEF 45 to 50%.  Right and left heart catheterization 05/29/2021 showed 20% proximal RCA, 20% proximal to mid circumflex, 30% mid LAD with normal right and left heart pressures.  His shortness of breath was felt to be out of proportion to his very small PE.  Echocardiogram images were reviewed and felt RV was mildly dilated but RV EF was normal.  LVEF was normal.  He was referred to pulmonology for ongoing shortness of breath and restarted on diltiazem  180 mg daily and Lasix  was stopped because he was not fluid overloaded.  He was restarted on chlorthalidone  25 mg daily for hypertension.  He was seen August 2022 by Rosaline Pavy, and was still complaining of DOE with little activity.  He was also complaining of low heart rates.  He saw pulmonary for dyspnea on exertion/COPD and placed on O2 by pulmonary.  O2 sats were in the 80s with walking.  Noted on EKG to have bradycardia and frequent PVCs.  Also complaining of leg swelling but had been eating fast food at least 3 days a week.  He wore a heart monitor to assess bradycardia and PVC load and was noted to have symptomatic ventricular  tachycardia on monitor as well as PAF and was admitted to the ER.  Echo showed normal LVEF 66 5% with mild LVH, mildly reduced RV EF and mild RVE and positive PFO.  Started on verapamil  for atrial fibrillation/VT/PVCs and monitored on telemetry.  Cardiac MRI showed findings consistent with HOCM and LVEF 52% and RVEF 45%.  He underwent ICD placement 08/07/2021 and was discharged on carvedilol  and verapamil .  He was hospitalized October 2022 with worsening shortness of breath with acute hypoxic respiratory failure.  He was eventually weaned off of oxygen and was hypovolemic on admission.  Unfortunately he was found to have GI bleed and underwent EGD showing gastric ulcer and gastritis.  Hemoglobin was stabilized at discharge.  Recommended to hold aspirin  until ulcer treatment was completed.  His losartan  had been held because of soft blood pressures in the hospital.  Chlorthalidone  was stopped and he was continued on oral Lasix  and spironolactone .  He was seen by Dr. Wonda 11/2021 for follow-up of his PFO.  At that time he was no longer hypoxemic and was felt that his chronic dyspnea was multifactorial from chronic CHF, obesity, and deconditioning.  Not felt that transcatheter PFO closure would benefit him.  Also referred to Dr. Tamra for HOCM.  It was recommended that alpha galactosidase level be checked to r/o Fabry's disease.  Genetic testing was also discussed but family wanted to wait until they saw him back in 3 months.  He was last seen by Dr. Bihari  01/26/2022 and was seen by [facility] cardiology and EP for second opinion.  They were concerned that his symptoms may be related to PVCs.  They felt that he did not need to beta-blockers and his metoprolol  was stopped but he was continued on sotalol  despite having HOCM.  Continued to have shortness of breath.  Not on oxygen.  Daughter was frustrated that nobody could find the cause of his shortness of breath and his oxygen at home intermittently drop down  to 89%.  Denied chest pain or pressure.  Denied palpitations or syncope.  Compliant with medications.  Chronic lower extremity edema.  He was seen by me 2/24, he states he has been feeling much better than the previous year.  Feels good today.  He had his knees replaced May and then July of last year.  He did physical therapy and did well with that.  His last CT scan of his aorta was a year ago so he is due for an annual scan.  Ascending aortic aneurysm measured 4.4 cm at that time.  We discussed tight blood pressure control and he is encouraged to monitor this at home.  He has been compliant with his medications.  We have provided refills today.  He is asked for sildenafil  prescription today.  He plans to go to [place] at the end of this month through March so we will try to get his CT scan done before then.  He was seen by Charlies Arthur, PA-C and was doing okay at that time.  Occasionally felt like when he got out of the shower his heart rate skipped a beat.  No sustained palpitations.  No syncope/presyncope or dizzy spells.  No chest pain.  Some shortness of breath.  No edema.  ICD was functioning properly.  He remained on sotalol .  Labs were reviewed at that time.  Continue on Eliquis , low PAF burden.  Some hypertension but similar to home BP readings.  He was seen by me 05/2023, he tells me that his heart rate sometimes goes between 110-115bpm.  Oxygen was 88% today.  He tells me he had an incident where he went to take the trash cans out and his heart rate was racing and he felt like he was going to pass out.  He feels exhausted.  Lately, his heart rate has been going off oxygen has been dropping.  It has been as low as 82% at night when he takes his CPAP off to use the restroom.  He saw Dr. Kara in August of last year and likely needs a follow-up which we discussed today.  We discussed ordering oxygen for him to use at home as needed.  I also discussed his EKG with Dr. Waddell and we switched his  metoprolol  to tartrate to metoprolol  succinate.  I continued his sotalol .  He will see him in follow-up in August.   Reports no chest pain, pressure, or tightness. No edema, orthopnea, PND.   I saw him February 2025, he presents with a history of heart disease managed with an ICD for routine follow-up. He reports a recent adjustment to his ICD settings to maintain a higher heart rate, which he monitors at home. He denies any issues with his blood pressure. He has an upcoming CT scan scheduled to monitor his aorta, which is part of his routine care.  The patient also reports issues with his CPAP machine, which he uses for sleep apnea. He has two machines, one set at a pressure of 8  and another at 70. He finds the pressure of 70 too strong, causing leaks in his mask, and prefers the machine set at 8. He has been using the older machine set at 8, but would like the new machine's settings adjusted.  In addition, the patient has been self-monitoring his oxygen levels at night. He initially stopped using supplemental oxygen when his levels were consistently in the high 90s. However, he noticed a drop to the high 80s and low 90s after discontinuing the oxygen, and has since resumed its use.  The patient also requests refills for his spironolactone  and Viagra  prescriptions. He prefers a 90-day supply of spironolactone  for convenience. He also notes that his Viagra  prescription does not automatically refill through his MyChart account  Reports no shortness of breath nor dyspnea on exertion. Reports no chest pain, pressure, or tightness. No edema, orthopnea, PND. Reports no palpitations.   Discussed the use of AI scribe software for clinical note transcription with the patient, who gave verbal consent to proceed.  Today, he presents with a hx of pacemaker with increased shortness of breath and fluid retention.  He experiences significant shortness of breath with minimal exertion, such as raking leaves or  bending to tie his shoes, which improves with weight loss. His weight fluctuates between 243 to 250 pounds, attributed to fluid retention, and decreases with diuretic use. He takes Lasix  as needed but struggles with frequent urination affecting work.  His pacemaker, last checked remotely in September, is A paced with 57% battery life remaining. He has not experienced lightheadedness or dizziness but reports significant shortness of breath and fatigue.  He has an aortic aneurysm measuring 4.4 cm, unchanged from March of last year to August of this year. He expresses concern about the cost of medical tests and procedures, including CT scans for his aneurysm.  Reports no chest pain, pressure, or tightness. No edema, orthopnea, PND. Reports no palpitations.   Discussed the use of AI scribe software for clinical note transcription with the patient, who gave verbal consent to proceed.   Past Medical History    Past Medical History:  Diagnosis Date   AICD (automatic cardioverter/defibrillator) present    Ascending aortic aneurysm    a. CT 05/2021 showed 4.4x4.4cm TAA. Stable on chest CTA 12/2021 at 4.4cm.   Atrial fibrillation (HCC)    Bilateral lower extremity edema    Celiac artery dilatation    1.8cm at the celiac trunk on Chest CT 12/2021   CHF (congestive heart failure) (HCC)    Chronic gout without tophus    followed by dr ziolkowska    (06-08-2020  per pt last episode right knee 3 wks ago)   CKD (chronic kidney disease), stage II    nephrology--- Manuelita Labella PA (10-29-2019 note in epic scanned in  media)   Coronary artery disease    Heart failure with mid-range ejection fraction (HCC) 05/27/2021   05/26/21: Left ventricular ejection fraction, by estimation, is 45 to 50%. There is severe asymmetric left ventricular hypertrophy. G1DD present.    History of COVID-19 06/07/2021   HOCM (hypertrophic obstructive cardiomyopathy) (HCC)    s/p ICD 07/2021   Hypertension    followed by  cardiology, dr t. turner   (05-14-2018 nuclear study in epic , normal perfusion with nuclear ef 61%)   Left hydrocele    Low serum potassium level 04/27/2014   OSA on CPAP    per pt uses every night   Palpitations followed by dr t. shlomo  06-08-2020  still feels palipations due to PVCs when exertion but not with chest pain/ discomfort   PVC's (premature ventricular contractions) cardiologist--- dr t. turner   Status post PVC ablation by Dr. Waddell 2019 with recurrence of frequent PVCs/bigeminy;  prior pseudobradycardia r/t pvcs   Rheumatoid arthritis involving multiple sites [facility])    rheumotology--- dr a. ziolkowska  ([facility] in [place])   Past Surgical History:  Procedure Laterality Date   BIOPSY  10/08/2021   Procedure: BIOPSY;  Surgeon: Stacia Glendia BRAVO, MD;  Location: [facility] ENDOSCOPY;  Service: Gastroenterology;;   VASSIE STUDY  08/18/2021   Procedure: BUBBLE STUDY;  Surgeon: Delford Maude BROCKS, MD;  Location: [facility] ENDOSCOPY;  Service: Cardiovascular;;   CATARACT EXTRACTION  04/01/2023   ESOPHAGOGASTRODUODENOSCOPY N/A 10/08/2021   Procedure: ESOPHAGOGASTRODUODENOSCOPY (EGD);  Surgeon: Stacia Glendia BRAVO, MD;  Location: [facility] ENDOSCOPY;  Service: Gastroenterology;  Laterality: N/A;   HYDROCELE EXCISION Left 06/14/2020   Procedure: LEFT  HYDROCELECTOMY ADULT;  Surgeon: Elisabeth Valli BIRCH, MD;  Location: [facility];  Service: Urology;  Laterality: Left;   ICD IMPLANT N/A 08/07/2021   Procedure: ICD IMPLANT;  Surgeon: Waddell Danelle ORN, MD;  Location: [facility] INVASIVE CV LAB;  Service: Cardiovascular;  Laterality: N/A;   INCISIONAL HERNIA REPAIR  02-23-2016   @[facility]    LAPAROSCOPIC   LAPAROSCOPIC INGUINAL HERNIA REPAIR Bilateral 08-22-2015  @[facility]    AND UMBILICAL HERNIA REPAIR   PVC ABLATION N/A 10/07/2018   Procedure: PVC ABLATION;  Surgeon: Waddell Danelle ORN, MD;  Location: [facility] INVASIVE CV LAB;  Service: Cardiovascular;  Laterality: N/A;   RIGHT/LEFT HEART CATH AND CORONARY ANGIOGRAPHY N/A  05/29/2021   Procedure: RIGHT/LEFT HEART CATH AND CORONARY ANGIOGRAPHY;  Surgeon: Verlin Lonni BIRCH, MD;  Location: [facility] INVASIVE CV LAB;  Service: Cardiovascular;  Laterality: N/A;   TEE WITHOUT CARDIOVERSION N/A 08/18/2021   Procedure: TRANSESOPHAGEAL ECHOCARDIOGRAM (TEE);  Surgeon: Delford Maude BROCKS, MD;  Location: [facility] ENDOSCOPY;  Service: Cardiovascular;  Laterality: N/A;   TOTAL KNEE ARTHROPLASTY Left 04/30/2022   Procedure: LEFT TOTAL KNEE ARTHROPLASTY;  Surgeon: Liam Lerner, MD;  Location: [facility];  Service: Orthopedics;  Laterality: Left;   TOTAL KNEE ARTHROPLASTY Right 06/18/2022   Procedure: RIGHT TOTAL KNEE ARTHROPLASTY;  Surgeon: Liam Lerner, MD;  Location: [facility];  Service: Orthopedics;  Laterality: Right;   UMBILICAL HERNIA REPAIR  child    Allergies  Allergies  Allergen Reactions   Amlodipine  Besylate Swelling and Other (See Comments)    Leg swelling with 10 mg daily am dosing; decreased with BID 5 mg dosing   Aspirin  Itching    EKGs/Labs/Other Studies Reviewed:   The following studies were reviewed today: Cardiac Cath 05/29/2021 reviewed as below:   Conclusion     Prox RCA lesion is 20% stenosed. Prox Cx to Mid Cx lesion is 20% stenosed. Mid LAD lesion is 30% stenosed.   Mild non-obstructive CAD Normal right and left heart pressures   Recommendations: Medical management of mild CAD. Will resume IV heparin  6 hours post sheath pull given acute PE. Would transition to Eliquis  or Xarelto tomorrow.     Echo 05/26/21 IMPRESSIONS     1. Left ventricular ejection fraction, by estimation, is 45 to 50%. The  left ventricle has mildly decreased function. The left ventricle has no  regional wall motion abnormalities. There is severe asymmetric left  ventricular hypertrophy. Left ventricular   diastolic parameters are consistent with Grade I diastolic dysfunction  (impaired relaxation).   2. Right ventricular systolic function is moderately  reduced. The right   ventricular size is moderately enlarged. There is moderately elevated  pulmonary artery systolic pressure.   3. Left atrial size was moderately dilated.   4. Right atrial size was mildly dilated.   5. The mitral valve is normal in structure. Mild mitral valve  regurgitation.   6. The aortic valve is normal in structure. Aortic valve regurgitation is  not visualized. No aortic stenosis is present.   7. Aortic dilatation noted. There is mild dilatation of the ascending  aorta, measuring 41 mm.     CT 06/07/21 IMPRESSION: 1. Bland appearing, bandlike scarring and or partial atelectasis of the dependent lungs, unchanged compared to prior examination. No evidence of fibrotic interstitial lung disease. No significant air trapping on expiratory phase imaging. 2. Cardiomegaly and coronary artery disease. 3. Unchanged enlargement of the tubular ascending thoracic aorta, measuring up to 4.4 x 4.4 cm. Recommend annual imaging followup by CTA or MRA if not otherwise imaged. This recommendation follows 2010 ACCF/AHA/AATS/ACR/ASA/SCA/SCAI/SIR/STS/SVM Guidelines for the Diagnosis and Management of Patients with Thoracic Aortic Disease. Circulation. 2010; 121: Z733-z630. Aortic aneurysm NOS (ICD10-I71.9)   Aortic Atherosclerosis (ICD10-I70.0).     Electronically Signed   By: Marolyn Jaksch M.D.   On: 06/07/2021 15:12   cMRI 07/2021 IMPRESSION: 1. Asymmetric LV hypertrophy measuring 18mm in basal septum (10mm in basal lateral wall). This meets criteria for hypertrophic cardiomyopathy   2. Patchy late gadolinium enhancement at RV insertion site and basal lateral wall. LGE accounts for 1% of total myocardial mass. While this LGE pattern is consistent with HCM, would also consider Fabry disease as it can present with basal lateral LGE and LVH. Would recommend checking alpha-galactosidase A level   3. Normal LV size and systolic function (EF 52%), though was difficult to quantify volumes/EF due  to significant motion artifact during acquisition   4.  Normal RV size with mild systolic dysfunction (EF 45%)   5.  Small pericardial effusion   6.  Dilated ascending aorta measuring 43mm  EKG:  EKG is not ordered today.   Recent Labs: 04/01/2024: ALT 33; BUN 26; Hemoglobin 15.4; Platelets 172; Potassium 4.2; Sodium 141; TSH 2.530 07/28/2024: Creatinine, Ser 1.60  Recent Lipid Panel    Component Value Date/Time   CHOL 146 04/01/2024 0815   TRIG 161 (H) 04/01/2024 0815   HDL 49 04/01/2024 0815   CHOLHDL 3.0 04/01/2024 0815   CHOLHDL 2.5 05/27/2021 0138   VLDL 13 05/27/2021 0138   LDLCALC 70 04/01/2024 0815     Home Medications   Current Meds  Medication Sig   Abatacept  (ORENCIA  CLICKJECT) 125 MG/ML SOAJ Inject 125 mg into the skin every Sunday.   acetaminophen -codeine  (TYLENOL  #3) 300-30 MG tablet Take 1 tablet by mouth every 8 (eight) hours as needed for moderate pain (4-6).   albuterol  (VENTOLIN  HFA) 108 (90 Base) MCG/ACT inhaler Inhale 2 puffs into the lungs every 4 (four) hours as needed for wheezing or shortness of breath.   allopurinol  (ZYLOPRIM ) 100 MG tablet Take 1 tablet (100 mg total) by mouth daily.   apixaban  (ELIQUIS ) 5 MG TABS tablet Take 1 tablet (5 mg total) by mouth 2 (two) times daily.   atorvastatin  (LIPITOR) 40 MG tablet Take 1 tablet (40 mg total) by mouth daily.   azelastine  (ASTELIN ) 0.1 % nasal spray Place 1 spray into both nostrils 2 (two) times daily. Stop taking claritin /zyrtec/loratadine /cetirizine (if taking).   colchicine  0.6 MG tablet Take 1 tablet (0.6 mg total) by mouth 2 (two)  times daily as needed for muscle/joint pain.   ferrous sulfate  325 (65 FE) MG tablet Take 325 mg by mouth daily.   fluticasone  (FLONASE ) 50 MCG/ACT nasal spray Place 1 spray into both nostrils daily. (Patient taking differently: Place 1 spray into both nostrils daily as needed for allergies.)   gabapentin  (NEURONTIN ) 300 MG capsule Take 1 capsule (300 mg total) by mouth 3  (three) times daily.   hydrALAZINE  (APRESOLINE ) 100 MG tablet Take 1 tablet (100 mg total) by mouth 3 (three) times daily.   magnesium  oxide (MAG-OX) 400 MG tablet Take 1 tablet (400 mg total) by mouth every other day. While taking daily multivitamin with 100mg  magnesium  every day   metoprolol  succinate (TOPROL -XL) 50 MG 24 hr tablet Take 1 tablet (50 mg total) by mouth daily. Take with or immediately following a meal.   montelukast  (SINGULAIR ) 10 MG tablet Take 1 tablet (10 mg total) by mouth at bedtime.   Multiple Vitamin (MULTIVITAMIN WITH MINERALS) TABS tablet Take 1 tablet by mouth daily.   potassium chloride  SA (KLOR-CON  M) 20 MEQ tablet Take 1 tablet (20 mEq total) by mouth daily as needed (with furosemide ).   predniSONE  (DELTASONE ) 5 MG tablet Take 1 tablet (5 mg total) by mouth daily.   sildenafil  (VIAGRA ) 50 MG tablet Take 1 tablet (50 mg total) by mouth daily as needed for ED   sotalol  (BETAPACE ) 120 MG tablet Take 1 tablet (120 mg total) by mouth every 12 (twelve) hours.   spironolactone  (ALDACTONE ) 25 MG tablet Take 1 tablet (25 mg total) by mouth daily.   [DISCONTINUED] furosemide  (LASIX ) 40 MG tablet Take 1 tablet (40 mg total) by mouth daily as needed for fluid or edema (weight gain).     Review of Systems      All other systems reviewed and are otherwise negative except as noted above.  Physical Exam    VS:  BP (!) 140/80 (BP Location: Right Arm, Patient Position: Sitting, Cuff Size: Large)   Pulse 90   Resp 16   Ht 5' 8 (1.727 m)   Wt 250 lb 12.8 oz (113.8 kg)   SpO2 97%   BMI 38.13 kg/m  , BMI Body mass index is 38.13 kg/m.  Wt Readings from Last 3 Encounters:  10/07/24 250 lb 12.8 oz (113.8 kg)  04/01/24 242 lb (109.8 kg)  01/14/24 244 lb (110.7 kg)     GEN: Well nourished, well developed, in no acute distress. HEENT: normal. Neck: Supple, no JVD, carotid bruits, or masses. Cardiac: RRR, no murmurs, rubs, or gallops. No clubbing, cyanosis, edema.   Radials/PT 2+ and equal bilaterally.  Respiratory:  Respirations regular and unlabored, clear to auscultation bilaterally. GI: Soft, nontender, nondistended. MS: No deformity or atrophy. Skin: Warm and dry, no rash. Neuro:  Strength and sensation are intact. Psych: Normal affect.  Assessment & Plan    HCM/grad 1 DD/volume overload -Asymmetric LV hypertrophy measuring 18mm in basal septum (10mm in basal lateral wall). This meets criteria for hypertrophic cardiomyopathy. (cMRI 07/2021) Fluid overload with weight increase to 250 lbs from 236 lbs in January, contributing to dyspnea and difficulty with activities. Discussed fluid management and diuretics to reduce fluid overload and improve breathing. - Administer Lasix  40 mg daily for three days. - Monitor weight daily. - Advise on fluid intake of 50-60 ounces per day. - Instruct on low sodium diet, limiting to 1500 mg per day. -update echo  Dyspnea Increased shortness of breath over the past month, likely related to  fluid overload and possibly underlying cardiac issues. Discussed fluid management and diuretics to alleviate symptoms. - Administer Lasix  40 mg daily for three days. Then, PRN.  - Order echocardiogram to assess cardiac function.  Paroxysmal atrial fibrillation Managed with a pacemaker. Reports increased shortness of breath and feeling out of breath with physical activities. Pacemaker check in September showed appropriate function with AV pacing and good battery life. - Continue monitoring with remote pacemaker checks. - Schedule follow-up with EP specialist, Dr. Waddell.  Abdominal aortic aneurysm Aneurysm at 4.4 cm, unchanged from previous measurements. Considered moderate enlargement. Discussed potential for extending follow-up interval to every two years, pending approval from Dr. Shlomo. - Consult with Dr. Shlomo regarding extending CT follow-up to every two years.  Obstructive sleep apnea On CPAP and supplemental oxygen.  Reports low oxygen levels during the day, sometimes as low as 79%. Discussed maintaining oxygen levels above 92% and adjusting oxygen flow as needed. - Continue CPAP and supplemental oxygen. - Adjust oxygen flow to maintain levels above 92%. - Schedule follow-up with pulmonary specialist  Implantable Cardioverter Defibrillator (ICD)/Afib Device check was normal. Heart rate set to 90s. -Continue current device settings.  Disposition: Follow up 6 months with Wilbert Shlomo, MD or APP. Follow-up with EP.   Signed, Orren LOISE Fabry, PA-C 10/07/2024, 4:59 PM  [facility]

## 2024-10-07 ENCOUNTER — Other Ambulatory Visit (HOSPITAL_COMMUNITY): Payer: Self-pay

## 2024-10-07 ENCOUNTER — Encounter: Payer: Self-pay | Admitting: Physician Assistant

## 2024-10-07 ENCOUNTER — Ambulatory Visit: Attending: Cardiology | Admitting: Physician Assistant

## 2024-10-07 VITALS — BP 140/80 | HR 90 | Resp 16 | Ht 68.0 in | Wt 250.8 lb

## 2024-10-07 DIAGNOSIS — I480 Paroxysmal atrial fibrillation: Secondary | ICD-10-CM

## 2024-10-07 DIAGNOSIS — Z79899 Other long term (current) drug therapy: Secondary | ICD-10-CM

## 2024-10-07 DIAGNOSIS — R0609 Other forms of dyspnea: Secondary | ICD-10-CM

## 2024-10-07 DIAGNOSIS — I472 Ventricular tachycardia, unspecified: Secondary | ICD-10-CM

## 2024-10-07 DIAGNOSIS — I422 Other hypertrophic cardiomyopathy: Secondary | ICD-10-CM

## 2024-10-07 DIAGNOSIS — Z7689 Persons encountering health services in other specified circumstances: Secondary | ICD-10-CM

## 2024-10-07 DIAGNOSIS — Z95810 Presence of automatic (implantable) cardiac defibrillator: Secondary | ICD-10-CM

## 2024-10-07 MED ORDER — FUROSEMIDE 40 MG PO TABS
40.0000 mg | ORAL_TABLET | Freq: Every day | ORAL | 0 refills | Status: DC | PRN
  Filled 2024-10-07: qty 90, 90d supply, fill #0

## 2024-10-07 NOTE — Patient Instructions (Addendum)
 Medication Instructions:  Take Furosemide  (Lasix ) 40mg  for 3 days, then take as needed. *If you need a refill on your cardiac medications before your next appointment, please call your pharmacy*  Lab Work: None If you have labs (blood work) drawn today and your tests are completely normal, you will receive your results only by: MyChart Message (if you have MyChart) OR A paper copy in the mail If you have any lab test that is abnormal or we need to change your treatment, we will call you to review the results.  Testing/Procedures:  Your physician has requested that you have an echocardiogram. Echocardiography is a painless test that uses sound waves to create images of your heart. It provides your doctor with information about the size and shape of your heart and how well your heart's chambers and valves are working. This procedure takes approximately one hour. There are no restrictions for this procedure. Please do NOT wear cologne, perfume, aftershave, or lotions (deodorant is allowed). Please arrive 15 minutes prior to your appointment time.  Please note: We ask at that you not bring children with you during ultrasound (echo/ vascular) testing. Due to room size and safety concerns, children are not allowed in the ultrasound rooms during exams. Our front office staff cannot provide observation of children in our lobby area while testing is being conducted. An adult accompanying a patient to their appointment will only be allowed in the ultrasound room at the discretion of the ultrasound technician under special circumstances. We apologize for any inconvenience.   Follow-Up: At [facility], you and your health needs are our priority.  As part of our continuing mission to provide you with exceptional heart care, our providers are all part of one team.  This team includes your primary Cardiologist (physician) and Advanced Practice Providers or APPs (Physician Assistants and Nurse  Practitioners) who all work together to provide you with the care you need, when you need it.  Your next appointment:   6 month(s)  Provider:   Wilbert Bihari, MD    Follow up with EP- Dr. Almetta  We recommend signing up for the patient portal called MyChart.  Sign up information is provided on this After Visit Summary.  MyChart is used to connect with patients for Virtual Visits (Telemedicine).  Patients are able to view lab/test results, encounter notes, upcoming appointments, etc.  Non-urgent messages can be sent to your provider as well.   To learn more about what you can do with MyChart, go to [email or web address].   Other Instructions You have been referred to GI - Dr. Rollin  Follow-up with your Pulmonologist

## 2024-10-08 ENCOUNTER — Other Ambulatory Visit: Payer: Self-pay | Admitting: General Practice

## 2024-10-09 ENCOUNTER — Telehealth: Payer: Self-pay | Admitting: Registered Nurse

## 2024-10-09 ENCOUNTER — Encounter: Payer: Self-pay | Admitting: Registered Nurse

## 2024-10-09 DIAGNOSIS — I5020 Unspecified systolic (congestive) heart failure: Secondary | ICD-10-CM

## 2024-10-09 MED ORDER — FUROSEMIDE 40 MG PO TABS: 40.0000 mg | ORAL_TABLET | Freq: Every day | ORAL | Status: AC | PRN

## 2024-10-09 NOTE — Telephone Encounter (Signed)
 Patient had cardiology appt 29 Oct was told to take furosemide  daily x 3 days.  Needs refill on furosemide  40mg  po daily prn and potassium chloride  20meq po daily prn with furosemide  use  Last filled  04/28/24 #90 furosemide  and 60 # potassium  Last labs   Latest Reference Range & Units 04/01/24 08:15 07/28/24 13:36  Sodium 134 - 144 mmol/L 141   Potassium 3.5 - 5.2 mmol/L 4.2   Chloride 96 - 106 mmol/L 105   Glucose 70 - 99 mg/dL 84   BUN 8 - 27 mg/dL 26   Creatinine 9.38 - 1.24 mg/dL 8.54 (H) 8.39 (H)  Calcium  8.6 - 10.2 mg/dL 9.7   BUN/Creatinine Ratio 10 - 24  18   eGFR >59 mL/min/1.73 52 (L)   Phosphorus 2.8 - 4.1 mg/dL 4.0   Alkaline Phosphatase 44 - 121 IU/L 81   Albumin  3.9 - 4.9 g/dL 4.3   Uric Acid 3.8 - 8.4 mg/dL 8.1   AST 0 - 40 IU/L 26   ALT 0 - 44 IU/L 33   Total Protein 6.0 - 8.5 g/dL 6.2   Total Bilirubin 0.0 - 1.2 mg/dL 0.9   GGT 0 - 65 IU/L 33   Estimated CHD Risk 0.0 - 1.0 times avg.  < 0.5   LDH 121 - 224 IU/L 213   Total CHOL/HDL Ratio 0.0 - 5.0 ratio 3.0   Cholesterol, Total 100 - 199 mg/dL 853   HDL Cholesterol >60 mg/dL 49   Triglycerides 0 - 149 mg/dL 838 (H)   VLDL Cholesterol Cal 5 - 40 mg/dL 27   LDL Chol Calc (NIH) 0 - 99 mg/dL 70   Iron 38 - 830 ug/dL 844   Globulin, Total 1.5 - 4.5 g/dL 1.9   WBC 3.4 - 89.1 k89Z6/lO 7.6   RBC 4.14 - 5.80 x10E6/uL 4.53   Hemoglobin 13.0 - 17.7 g/dL 84.5   HCT 62.4 - 48.9 % 46.9   MCV 79 - 97 fL 104 (H)   MCH 26.6 - 33.0 pg 34.0 (H)   MCHC 31.5 - 35.7 g/dL 67.1   RDW 88.3 - 84.5 % 12.8   Platelets 150 - 450 x10E3/uL 172   Neutrophils Not Estab. % 68   Immature Granulocytes Not Estab. % 0   NEUT# 1.4 - 7.0 x10E3/uL 5.1   Lymphs Abs 0.7 - 3.1 x10E3/uL 2.0   Monocytes Absolute 0.1 - 0.9 x10E3/uL 0.5   Basophils Absolute 0.0 - 0.2 x10E3/uL 0.0   Immature Grans (Abs) 0.0 - 0.1 x10E3/uL 0.0   Lymphs Not Estab. % 26   Monocytes Not Estab. % 6   Basos Not Estab. % 0   Eos Not Estab. % 0   EOS (ABSOLUTE) 0.0 - 0.4  x10E3/uL 0.0   (H): Data is abnormally high (L): Data is abnormally low Last BP and weight at cardiology appt BP 140/80 Important   (BP Location: Right Arm, Patient Position: Sitting, Cuff Size: Large)   Pulse 90   Resp 16   Ht 5' 8 (1.727 m)   Wt 250 lb 12.8 oz (113.8 kg)   SpO2 97%   BMI 38.13 kg/m   BSA 2.34 m Patient reported he had restarted cooking eggs for breakfast and adding salt to eggs.  Discussed Mrs Hollis instead of salt  Patient stated he would stop after work at [employer] to buy some and try with his breakfast.  Usually 2 pieces toast and cream cheese or jelly prior to starting egg  routine.  Dispensed 60 tabs each furosemide  40mg  and potassium 20 meq ER take daily prn together if leg swelling/3 lbs weight gain in a day or 5 lbs weight gain in a week after taking 3 days in a row per cardiology. He stated he does weigh himself in the morning each day.   Patient agreed with plan of care and had no further questions at this time.

## 2024-10-15 ENCOUNTER — Other Ambulatory Visit: Payer: Self-pay | Admitting: Registered Nurse

## 2024-10-15 DIAGNOSIS — Z23 Encounter for immunization: Secondary | ICD-10-CM

## 2024-10-16 ENCOUNTER — Other Ambulatory Visit (HOSPITAL_COMMUNITY): Payer: Self-pay

## 2024-10-18 ENCOUNTER — Encounter: Payer: Self-pay | Admitting: Registered Nurse

## 2024-10-18 ENCOUNTER — Other Ambulatory Visit: Payer: Self-pay | Admitting: Registered Nurse

## 2024-10-18 DIAGNOSIS — J301 Allergic rhinitis due to pollen: Secondary | ICD-10-CM

## 2024-10-18 MED ORDER — MONTELUKAST SODIUM 10 MG PO TABS
10.0000 mg | ORAL_TABLET | Freq: Every day | ORAL | 3 refills | Status: AC
  Filled 2024-10-18: qty 90, 90d supply, fill #0
  Filled 2024-12-21: qty 90, 90d supply, fill #1

## 2024-10-18 NOTE — Telephone Encounter (Signed)
 Patient with spring and fall allergies not controlled with fluticasone , nasal saline and antihistamine.  Singulair  10mg  po at bedtime #90 RF3 electronic rx refill sent to his pharmacy of choice.

## 2024-10-19 ENCOUNTER — Other Ambulatory Visit (HOSPITAL_COMMUNITY): Payer: Self-pay

## 2024-10-19 ENCOUNTER — Other Ambulatory Visit: Payer: Self-pay

## 2024-10-20 ENCOUNTER — Ambulatory Visit: Admitting: General Practice

## 2024-10-20 DIAGNOSIS — Z23 Encounter for immunization: Secondary | ICD-10-CM

## 2024-10-20 NOTE — Telephone Encounter (Signed)
 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  Tolerating without difficulty and feeling better.

## 2024-10-21 ENCOUNTER — Other Ambulatory Visit: Payer: Self-pay | Admitting: Physician Assistant

## 2024-10-21 ENCOUNTER — Encounter: Payer: Self-pay | Admitting: Physician Assistant

## 2024-10-21 DIAGNOSIS — I422 Other hypertrophic cardiomyopathy: Secondary | ICD-10-CM

## 2024-10-21 DIAGNOSIS — R0600 Dyspnea, unspecified: Secondary | ICD-10-CM

## 2024-10-21 NOTE — Progress Notes (Signed)
 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 Patient message sent.  Orren LOISE Fabry, PA-C

## 2024-10-22 ENCOUNTER — Ambulatory Visit: Admitting: Registered Nurse

## 2024-10-22 ENCOUNTER — Encounter: Payer: Self-pay | Admitting: Registered Nurse

## 2024-10-22 ENCOUNTER — Other Ambulatory Visit: Payer: Self-pay

## 2024-10-22 VITALS — Resp 16 | Wt 247.0 lb

## 2024-10-22 DIAGNOSIS — R0600 Dyspnea, unspecified: Secondary | ICD-10-CM

## 2024-10-22 DIAGNOSIS — I5020 Unspecified systolic (congestive) heart failure: Secondary | ICD-10-CM

## 2024-10-22 DIAGNOSIS — I422 Other hypertrophic cardiomyopathy: Secondary | ICD-10-CM

## 2024-10-22 NOTE — Telephone Encounter (Signed)
 Patient seen in clinic today weight 247lbs with jeans/polo/shoes. Took furosemide  today.  Cardiology evaluation him to see if fabrey disease and has lab planned.

## 2024-10-22 NOTE — Progress Notes (Signed)
 68y/o [place]ian male married here to discuss if fabrey genetic test can be drawn at [facility] and weight check.  Discussed with patient if lab greater than $100 employer prefers draw with ordering provider office and billing to insurance.  Discussed cost of this test greater than $100 ordered by cardiology.  Patient stated he will follow up with cardiology office to have lab drawn prior to work as they open 7am and close 5pm and closed for 1 hour at lunch.  Patient trace pitting edema today.   Stated did not take lasix  previous 2 days but did take today.  Encouraged patient to continue use if greater than 3lb gain per day or 5 lb per week.  Patient has sufficient supply potassium and furosemide  at home recently dispensed from [facility].  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  Patient verbalized understanding information/instructions and had no further questions at that time.

## 2024-10-27 ENCOUNTER — Encounter: Payer: Self-pay | Admitting: Registered Nurse

## 2024-10-27 ENCOUNTER — Ambulatory Visit: Admitting: Registered Nurse

## 2024-10-27 VITALS — BP 124/68 | HR 90 | Resp 18 | Wt 248.0 lb

## 2024-10-27 DIAGNOSIS — S058X1A Other injuries of right eye and orbit, initial encounter: Secondary | ICD-10-CM

## 2024-10-27 DIAGNOSIS — M0609 Rheumatoid arthritis without rheumatoid factor, multiple sites: Secondary | ICD-10-CM

## 2024-10-27 DIAGNOSIS — S058X2A Other injuries of left eye and orbit, initial encounter: Secondary | ICD-10-CM

## 2024-10-27 DIAGNOSIS — I5020 Unspecified systolic (congestive) heart failure: Secondary | ICD-10-CM

## 2024-10-27 MED ORDER — CARBOXYMETHYLCELLULOSE SOD PF 0.5 % OP SOLN: 1.0000 [drp] | Freq: Three times a day (TID) | OPHTHALMIC | Status: AC | PRN

## 2024-10-27 MED ORDER — PREDNISONE 10 MG PO TABS: 5.0000 mg | ORAL_TABLET | Freq: Every day | ORAL | Status: AC

## 2024-10-27 MED ORDER — ERYTHROMYCIN 5 MG/GM OP OINT: 1.0000 | TOPICAL_OINTMENT | Freq: Four times a day (QID) | OPHTHALMIC | 0 refills | Status: AC

## 2024-10-27 NOTE — Progress Notes (Signed)
 Established Patient Office Visit  Subjective   Patient ID: Ryan Glass, male    DOB: 07-21-1956  Age: 68 y.o. MRN: [id number]  Chief Complaint  Patient presents with   Pain    Bilateral eyes and redness right greater than left    68y/o [place]ian married established male stated weight 247lbs at home prior to breakfast naked took lasix  this am.  Has been taking most days but not every day  Was working in yard blowing leaves this weekend.  Eyes have been very red left greater than right.  Did not wear eye protection when working in yard.  Denied known trauma or foreign body to eye  Eyes have been hurting has tried visine but no improvement.  Denied visual changes/headache/fever/chills  Concerned may run out of prednisone  during the holidays requesting another bottle dispense from [facility] using 5mg  po daily last fill was October 2025.  Using for his rheumatoid arthritis and helps manage pain/swelling of joints.  Stated has been wearing his CPAP with oxygen at night  If he does not wear oxygen sp02 88% RA typically 97% RA after wearing oxygen when he wakes up and stops use  Having some anxiety with ICE raids in [place] as his daughters live there even though born in [place]  and he/spouse are naturalized citizens concerned due to darker skin tone could be detained      Review of Systems  Constitutional:  Negative for chills, diaphoresis, fever and malaise/fatigue.  HENT:  Positive for congestion. Negative for ear discharge and ear pain.   Eyes:  Positive for pain and redness.  Respiratory:  Positive for shortness of breath. Negative for cough, hemoptysis, sputum production and wheezing.   Gastrointestinal:  Negative for nausea and vomiting.  Skin:  Negative for itching and rash.  Neurological:  Negative for dizziness and headaches.  Endo/Heme/Allergies:  Positive for environmental allergies.      Objective:     BP 124/68 (BP Location: Left Arm, Patient Position: Sitting, Cuff Size: Large)    Pulse 90   Resp 18   Wt 248 lb (112.5 kg) Comment: shoes, jeans, polo shirt  SpO2 92%   BMI 37.71 kg/m    Physical Exam Vitals and nursing note reviewed.  Constitutional:      General: He is awake. He is not in acute distress.    Appearance: He is well-developed and well-groomed. He is obese. He is ill-appearing. He is not toxic-appearing or diaphoretic.  HENT:     Head: Normocephalic and atraumatic. No raccoon eyes, Battle's sign, abrasion, contusion, right periorbital erythema, left periorbital erythema or laceration.     Jaw: There is normal jaw occlusion.     Salivary Glands: Right salivary gland is not diffusely enlarged or tender. Left salivary gland is not diffusely enlarged or tender.     Right Ear: External ear normal. No decreased hearing noted. No laceration, drainage, swelling or tenderness.     Left Ear: External ear normal. No decreased hearing noted. No laceration, drainage, swelling or tenderness.     Nose: Mucosal edema and congestion present. No laceration or rhinorrhea.     Right Nostril: No epistaxis.     Left Nostril: No epistaxis.     Right Turbinates: Enlarged and swollen. Not pale.     Left Turbinates: Swollen. Not enlarged or pale.     Right Sinus: No maxillary sinus tenderness or frontal sinus tenderness.     Left Sinus: No maxillary sinus tenderness or frontal sinus tenderness.  Mouth/Throat:     Lips: Pink. No lesions.     Mouth: Mucous membranes are moist. No oral lesions or angioedema.     Dentition: Abnormal dentition. Has dentures. No gum lesions.     Tongue: No lesions. Tongue does not deviate from midline.     Palate: No mass and lesions.     Pharynx: Uvula midline. Pharyngeal swelling and postnasal drip present. No oropharyngeal exudate, posterior oropharyngeal erythema or uvula swelling.     Tonsils: No tonsillar exudate.     Comments: Cobblestoning posterior pharynx; bilateral allergic shiners; lower eyelids nonpitting edema 1-2+/4; nasal  congestion audible/sniffing observed in clinic Eyes:     General: Lids are normal. Vision grossly intact. Gaze aligned appropriately. Allergic shiner present. No visual field deficit or scleral icterus.       Right eye: No foreign body, discharge or hordeolum.        Left eye: No foreign body, discharge or hordeolum.     Extraocular Movements: Extraocular movements intact.     Right eye: Normal extraocular motion and no nystagmus.     Left eye: Normal extraocular motion and no nystagmus.     Conjunctiva/sclera:     Right eye: Right conjunctiva is injected. Hemorrhage present. No chemosis or exudate.    Left eye: Left conjunctiva is injected. Hemorrhage present. No chemosis or exudate.    Pupils: Pupils are equal, round, and reactive to light.     Right eye: Fluorescein uptake present. No corneal abrasion.     Left eye: Fluorescein uptake present. No corneal abrasion.      Comments: Woods lamp exam with fluorescein no corneal abrasion but dye uptake conjunctiva bulbar as noted above and vasculature inflamed; scant conjunctival hemorrhage noted bilateral laterally  Neck:     Trachea: Trachea and phonation normal.  Cardiovascular:     Rate and Rhythm: Normal rate and regular rhythm.     Pulses: Normal pulses.          Radial pulses are 2+ on the right side and 2+ on the left side.     Heart sounds: Normal heart sounds, S1 normal and S2 normal.  Pulmonary:     Effort: Pulmonary effort is normal. No respiratory distress.     Breath sounds: Normal breath sounds and air entry. No stridor or decreased air movement. No wheezing, rhonchi or rales.     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  Musculoskeletal:     Right hand: Normal strength. Normal capillary refill.     Left hand: Normal strength. Normal capillary refill.     Cervical back: No swelling, edema, deformity,  erythema, signs of trauma, lacerations, rigidity, torticollis or crepitus. Pain with movement present. Decreased range of motion.     Thoracic back: No swelling, edema, deformity, signs of trauma, lacerations, spasms or tenderness. Normal range of motion.     Right lower leg: No edema.     Left lower leg: No edema.     Comments: BBS CTA spoke full sentences without difficulty no cough observed in clinic  Lymphadenopathy:     Head:     Right side of head: No submental, submandibular, tonsillar, preauricular, posterior auricular or occipital adenopathy.     Left side of head: No submental, submandibular, tonsillar, preauricular, posterior auricular or occipital adenopathy.     Cervical: No cervical adenopathy.  Right cervical: No superficial, deep or posterior cervical adenopathy.    Left cervical: No superficial, deep or posterior cervical adenopathy.  Skin:    General: Skin is warm and dry.     Capillary Refill: Capillary refill takes less than 2 seconds.     Coloration: Skin is not ashen, cyanotic, jaundiced, mottled, pale or sallow.     Findings: No abrasion, abscess, acne, bruising, burn, ecchymosis, erythema, signs of injury, laceration, lesion, petechiae, rash or wound.     Nails: There is no clubbing.     Comments: Anterior face/neck/hands visually inspected  Neurological:     General: No focal deficit present.     Mental Status: He is alert. Mental status is at baseline.     GCS: GCS eye subscore is 4. GCS verbal subscore is 5. GCS motor subscore is 6.     Cranial Nerves: Cranial nerves 2-12 are intact. No cranial nerve deficit, dysarthria or facial asymmetry.     Sensory: Sensation is intact.     Motor: Motor function is intact. No weakness, tremor, atrophy, abnormal muscle tone or seizure activity.     Coordination: Coordination normal.     Gait: Gait normal.     Comments: In/out of chair and on/off exam table without difficulty; bilateral hand grasp equal 5/5 and gait sure  and steady in clinic  Psychiatric:        Attention and Perception: Attention and perception normal.        Mood and Affect: Mood and affect normal.        Speech: Speech normal.        Behavior: Behavior normal. Behavior is cooperative.        Thought Content: Thought content normal.        Cognition and Memory: Cognition and memory normal.        Judgment: Judgment normal.      No results found for any visits on 10/27/24.    The 10-year ASCVD risk score (Arnett DK, et al., 2019) is: 14.7%    Assessment & Plan:   Problem List Items Addressed This Visit       Cardiovascular and Mediastinum   Heart failure with mid-range ejection fraction (HCC)     Musculoskeletal and Integument   Rheumatoid arthritis of multiple sites with negative rheumatoid factor (HCC) - Primary   Relevant Medications   predniSONE  (DELTASONE ) 10 MG tablet   Other Visit Diagnoses       Abrasion of right eye, initial encounter         Abrasion of left eye, initial encounter          Dispensed 5 UD refresh from clinic stock to patient instill 2 gtts TID ou x 2 weeks and erythromycin  ophthalmic ointment #1 RF0 from [facility] to patient use QID 1 cm ou.x 1 week.  Cleared for work [employer] discussed. Patient to apply warm or cool packs prn right eye 5 minutes TID prn or warm compress.   Discussed blinking irritates eye blood vessels further as eyelid vessels rub on bulbar vessals. Instructed patient to not rub eyes. . May use over the counter eye drops/tears such as visine per manufacturer instructions to vasconstrict inflamed blood vessels also. Return to clinic if headache, fever greater than 100.53F, nausea/vomiting, purulent discharge/matting unable to open eye without using fingers after 24 hours of medication use, foreign body sensation, ciliary flush, worsening photophobia or vision. Discussed to see optometrist same day if visual field loss, worsening light sensitivity, orbital  swelling.  Call or return to  clinic as needed if these symptoms worsen or fail to improve as anticipated. Exitcare handout on corneal abrasion discussed there is bulbar not corneal abrasion on exam today but eye care instructions apply  Discussed wearing eye protection when using blower.. Patient verbalized agreement and understanding of treatment plan and had no further questions at this time.  P2: Hand washing, avoid contact use-wear glasses.    Patient concerned will run out of prednisone  soon dispensed prednisone  10mg  #21 RF0 from [facility] to patient last dispense 42 tabs 09/22/24 patient is cutting in half for rheuamtoid arthritis  Discussed continue lasix  and potassium daily as weight still not at baseline along with dyspnea with exertion today.  BBS CTA and no edema on exam BLE.  Avoid high salt diet.  Patient agreed with plan of care and had no further questions at this time. Return if symptoms worsen or fail to improve.    Ellouise DELENA Hope, NP

## 2024-10-27 NOTE — Patient Instructions (Addendum)
 Ryan Glass, You had cuts on the conjunctiva/white portion of eye not the cornea today but this was the best handout I could find for instructions available to care for eyes  Corneal Abrasion  A corneal abrasion is a scratch or injury to the clear tissue at the front of the eye (cornea). It can be very painful. The cornea protects the eye and helps the eye focus. The cornea is made up of many layers, but the surface layer is one of the most sensitive tissues in the body. A corneal abrasion heals fast when it is treated. If it is not treated, it can become infected and cause an open sore (ulcer). This can lead to scarring, and the scarring can affect vision. Sometimes after the abrasion heals, another abrasion can come back in the same place. What are the causes? A corneal abrasion may be caused by: A poke to the eye. A gritty or irritating substance (foreign body) in the eye. Too much eye rubbing. Very dry eyes. Certain eye infections. Contact lenses that do not fit right or are worn for too long. Injury can also happen when putting in contact lenses or taking them out. Eye surgery. Certain cornea problems that make it more likely to get a corneal abrasion. Sometimes, the cause is not known. What are the signs or symptoms? Symptoms of this condition include: Eye pain. The pain may get worse when you open, close, or move your eye. A feeling that something is poking your eye or is stuck in your eye. Tearing, redness, and sensitivity to light. Having trouble keeping your eye open, or not being able to keep it open. Blurred vision. Headache. How is this diagnosed? A corneal abrasion may be diagnosed based on your medical history, symptoms, and an eye exam. You may need to see a doctor who specializes in conditions of the eye (ophthalmologist or optometrist). Before the eye exam, you may be given eye drops that numb the eye. You may also have dye put in your eye with a dropper or a small paper strip.  This dye helps your eye doctor see your injury better when using a light or an eye scope (slit lamp). How is this treated? Treatment may vary based on what caused the corneal abrasion. It may include: Washing out your eye. Taking out anything that is stuck in your eye. Using antibiotic drops or ointment to treat or prevent an infection. Using eye drops that lessens inflammation and pain. Using steroid drops or ointment to treat redness, irritation, or inflammation. Applying a cold, wet cloth (cold compress) or ice pack to lessen the pain. Taking pain medicines. This may include over-the-counter medicines like ibuprofen. If you have a large or deep corneal abrasion, an opioid medicine may be prescribed. For large abrasions, an eye patch or a bandage soft contact lens might also be used. A bandage soft contact lens protects the cornea while it is healing. These are not used if the corneal abrasion is related to wearing contact lenses. This is because you may be more likely to get an eye infection. Follow these instructions at home: Medicines If you were prescribed eye drops or ointment, use them as told by your provider. You may be told to use: Antibiotic eye drops or ointment. Do not stop using them even if you start to feel better. Eye drops that moisten the eye (lubricating eye drops). Ask your provider if the medicine prescribed to you: Requires you to avoid driving or using machinery. Can cause  constipation. You may need to take these actions to prevent or treat constipation: Drink enough fluid to keep your pee (urine) pale yellow. Take over-the-counter or prescription medicines. Eat foods that are high in fiber, such as beans, whole grains, and fresh fruits and vegetables. Limit foods that are high in fat and processed sugars, such as fried or sweet foods. Take over-the-counter and prescription medicines only as told by your provider. Eye care Rest your eyes. Avoid bright light and  overusing (straining) your eyes. Wear sunglasses. Do not rub or touch your eye. Do not wash out your eye. Ask your provider if you can use a cold compress on your eye to lessen pain. If you have an eye patch: Wear it as told by your provider. Follow your provider's instructions about when to take it off. If you have a bandage soft contact lens, your provider will take it off during your follow-up visit. Do not wear your own contact lenses until your provider says it is okay. General instructions Do not drive or use machinery as told by your provider. You may not be able to judge distances as well if your vision is affected or if you are wearing an eye patch. Keep all follow-up visits. This helps to prevent infection and vision loss. Contact a health care provider if: You keep having eye pain and other symptoms for more than 2-3 days. You have new symptoms, such as more redness, tearing, or fluid (discharge) coming from your eye. You have swollen eyelids. Your vision gets much worse. You have discharge that makes your eyelids stick together in the morning. Your eye patch becomes so loose that you can blink your eye. Symptoms come back after your abrasion heals. Get help right away if: You have severe eye pain that does not get better with medicine. You have severe vision loss. This information is not intended to replace advice given to you by your health care provider. Make sure you discuss any questions you have with your health care provider. Document Revised: 12/13/2022 Document Reviewed: 12/13/2022 Elsevier Patient Education  2024 [employer].

## 2024-10-28 NOTE — Telephone Encounter (Signed)
 Patient seen in clinic 10/27/24 weight 248lbs in clinic with jeans/polo/shoes took lasix  this am some dyspnea with exertion discussed taking lasix  daily still no pedal edema noted

## 2024-10-30 LAB — ALPHA GALACTOSIDASE: Alpha-Galactosidase activity: 68.2 nmol/h/mg{prot} (ref >35.5)

## 2024-11-09 ENCOUNTER — Other Ambulatory Visit (HOSPITAL_COMMUNITY): Payer: Self-pay

## 2024-11-23 ENCOUNTER — Ambulatory Visit: Payer: No Typology Code available for payment source

## 2024-11-23 DIAGNOSIS — I472 Ventricular tachycardia, unspecified: Secondary | ICD-10-CM | POA: Diagnosis not present

## 2024-11-24 LAB — CUP PACEART REMOTE DEVICE CHECK
Battery Remaining Longevity: 44 mo
Battery Remaining Percentage: 54 %
Battery Voltage: 2.96 V
Brady Statistic AP VP Percent: 59 %
Brady Statistic AP VS Percent: 38 %
Brady Statistic AS VP Percent: 1.9 %
Brady Statistic AS VS Percent: 1 %
Brady Statistic RA Percent Paced: 96 %
Brady Statistic RV Percent Paced: 61 %
Date Time Interrogation Session: 20251215023802
HighPow Impedance: 72 Ohm
Implantable Lead Connection Status: 753985
Implantable Lead Connection Status: 753985
Implantable Lead Implant Date: 20220829
Implantable Lead Implant Date: 20220829
Implantable Lead Location: 753859
Implantable Lead Location: 753860
Implantable Pulse Generator Implant Date: 20220829
Lead Channel Impedance Value: 350 Ohm
Lead Channel Impedance Value: 430 Ohm
Lead Channel Pacing Threshold Amplitude: 0.75 V
Lead Channel Pacing Threshold Amplitude: 1.25 V
Lead Channel Pacing Threshold Pulse Width: 0.5 ms
Lead Channel Pacing Threshold Pulse Width: 0.5 ms
Lead Channel Sensing Intrinsic Amplitude: 12 mV
Lead Channel Sensing Intrinsic Amplitude: 3.1 mV
Lead Channel Setting Pacing Amplitude: 2 V
Lead Channel Setting Pacing Amplitude: 2.5 V
Lead Channel Setting Pacing Pulse Width: 0.5 ms
Lead Channel Setting Sensing Sensitivity: 0.5 mV
Pulse Gen Serial Number: 810029878

## 2024-11-27 ENCOUNTER — Ambulatory Visit: Payer: Self-pay | Admitting: Internal Medicine

## 2024-11-27 NOTE — Progress Notes (Signed)
 Remote ICD Transmission

## 2024-11-29 ENCOUNTER — Other Ambulatory Visit (HOSPITAL_COMMUNITY): Payer: Self-pay

## 2024-11-29 ENCOUNTER — Other Ambulatory Visit: Payer: Self-pay | Admitting: Cardiology

## 2024-11-29 DIAGNOSIS — I480 Paroxysmal atrial fibrillation: Secondary | ICD-10-CM

## 2024-11-30 ENCOUNTER — Other Ambulatory Visit (HOSPITAL_COMMUNITY): Payer: Self-pay

## 2024-11-30 MED ORDER — ACETAMINOPHEN-CODEINE 300-30 MG PO TABS
1.0000 | ORAL_TABLET | Freq: Three times a day (TID) | ORAL | 3 refills | Status: AC | PRN
  Filled 2024-11-30: qty 60, 20d supply, fill #0
  Filled 2025-01-07: qty 60, 20d supply, fill #1

## 2024-11-30 MED ORDER — GABAPENTIN 300 MG PO CAPS
300.0000 mg | ORAL_CAPSULE | Freq: Three times a day (TID) | ORAL | 3 refills | Status: AC
  Filled 2024-11-30: qty 90, 30d supply, fill #0
  Filled 2024-12-21 – 2024-12-28 (×2): qty 90, 30d supply, fill #1

## 2024-11-30 MED ORDER — APIXABAN 5 MG PO TABS
5.0000 mg | ORAL_TABLET | Freq: Two times a day (BID) | ORAL | 1 refills | Status: AC
  Filled 2024-11-30 (×2): qty 180, 90d supply, fill #0

## 2024-12-01 ENCOUNTER — Other Ambulatory Visit (HOSPITAL_COMMUNITY): Payer: Self-pay

## 2024-12-07 ENCOUNTER — Ambulatory Visit: Attending: Internal Medicine | Admitting: Internal Medicine

## 2024-12-07 ENCOUNTER — Other Ambulatory Visit: Payer: Self-pay | Admitting: Physician Assistant

## 2024-12-07 ENCOUNTER — Other Ambulatory Visit (HOSPITAL_COMMUNITY): Payer: Self-pay

## 2024-12-07 ENCOUNTER — Other Ambulatory Visit: Payer: Self-pay

## 2024-12-07 ENCOUNTER — Encounter: Payer: Self-pay | Admitting: Internal Medicine

## 2024-12-07 VITALS — BP 116/78 | HR 76 | Ht 70.0 in | Wt 252.0 lb

## 2024-12-07 DIAGNOSIS — I5032 Chronic diastolic (congestive) heart failure: Secondary | ICD-10-CM | POA: Diagnosis not present

## 2024-12-07 DIAGNOSIS — I7121 Aneurysm of the ascending aorta, without rupture: Secondary | ICD-10-CM

## 2024-12-07 DIAGNOSIS — I493 Ventricular premature depolarization: Secondary | ICD-10-CM | POA: Diagnosis not present

## 2024-12-07 DIAGNOSIS — I4729 Other ventricular tachycardia: Secondary | ICD-10-CM

## 2024-12-07 DIAGNOSIS — I422 Other hypertrophic cardiomyopathy: Secondary | ICD-10-CM | POA: Diagnosis not present

## 2024-12-07 LAB — CUP PACEART INCLINIC DEVICE CHECK
Battery Remaining Longevity: 44 mo
Brady Statistic RA Percent Paced: 96 %
Brady Statistic RV Percent Paced: 62 %
Date Time Interrogation Session: 20251229172614
HighPow Impedance: 67.5 Ohm
Implantable Lead Connection Status: 753985
Implantable Lead Connection Status: 753985
Implantable Lead Implant Date: 20220829
Implantable Lead Implant Date: 20220829
Implantable Lead Location: 753859
Implantable Lead Location: 753860
Implantable Pulse Generator Implant Date: 20220829
Lead Channel Impedance Value: 337.5 Ohm
Lead Channel Impedance Value: 425 Ohm
Lead Channel Pacing Threshold Amplitude: 0.75 V
Lead Channel Pacing Threshold Amplitude: 0.75 V
Lead Channel Pacing Threshold Amplitude: 1.25 V
Lead Channel Pacing Threshold Amplitude: 1.25 V
Lead Channel Pacing Threshold Pulse Width: 0.5 ms
Lead Channel Pacing Threshold Pulse Width: 0.5 ms
Lead Channel Pacing Threshold Pulse Width: 0.5 ms
Lead Channel Pacing Threshold Pulse Width: 0.5 ms
Lead Channel Sensing Intrinsic Amplitude: 3.8 mV
Lead Channel Sensing Intrinsic Amplitude: 8.2 mV
Lead Channel Setting Pacing Amplitude: 2 V
Lead Channel Setting Pacing Amplitude: 2.5 V
Lead Channel Setting Pacing Pulse Width: 0.5 ms
Lead Channel Setting Sensing Sensitivity: 0.5 mV
Pulse Gen Serial Number: 810029878

## 2024-12-07 MED ORDER — FUROSEMIDE 40 MG PO TABS
40.0000 mg | ORAL_TABLET | Freq: Every day | ORAL | 0 refills | Status: AC
  Filled 2024-12-07: qty 90, 90d supply, fill #0

## 2024-12-07 NOTE — Progress Notes (Signed)
 "     HPI Ryan Glass returns today for followup. He is a pleasant 68 yo man with HCM, PVC's for which he underwent catheter ablation in 2019, MMVT and aortic aneurysm. 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 He had 10% PVC's mostly monomorphic on his cardiac monitor. Despite normal LV function he has class 2B dyspnea. He admits to dietary indiscretion with sodium. He denies edema. He has minimal palpitations. He was hospitalized a couple of years ago with VT and worsening CHF. He was placed on sotalol . In the interim, he notes that he has had some peripheral edema. No syncope. He c/o dyspnea with exertion.  Allergies[1]   Current Outpatient Medications  Medication Sig Dispense Refill   Abatacept  (ORENCIA  CLICKJECT) 125 MG/ML SOAJ Inject 125 mg into the skin every Sunday.     acetaminophen -codeine  (TYLENOL  #3) 300-30 MG tablet Take 1 tablet by mouth every 8 (eight) hours as needed for moderate pain (4-6). 60 tablet 3   albuterol  (VENTOLIN  HFA) 108 (90 Base) MCG/ACT inhaler Inhale 2 puffs into the lungs every 4 (four) hours as needed for wheezing or shortness of breath. 6.7 g 1   allopurinol  (ZYLOPRIM ) 100 MG tablet Take 1 tablet (100 mg total) by mouth daily. 90 tablet 3   apixaban  (ELIQUIS ) 5 MG TABS tablet Take 1 tablet (5 mg total) by mouth 2 (two) times daily. 180 tablet 1   atorvastatin  (LIPITOR) 40 MG tablet Take 1 tablet (40 mg total) by mouth daily. 90 tablet 1   azelastine  (ASTELIN ) 0.1 % nasal spray Place 1 spray into both nostrils 2 (two) times daily. Stop taking claritin /zyrtec/loratadine /cetirizine (if taking). 30 mL 12   colchicine  0.6 MG tablet Take 1 tablet (0.6 mg total) by mouth 2 (two) times daily as needed for muscle/joint pain. 60 tablet 1   ferrous sulfate  325 (65 FE) MG tablet Take 325 mg by mouth daily.      fluticasone  (FLONASE ) 50 MCG/ACT nasal spray Place 1 spray into both nostrils daily. (Patient taking differently: Place 1 spray into both nostrils daily as needed for allergies.)     gabapentin  (NEURONTIN ) 300 MG capsule Take 1 capsule (300 mg total) by mouth 3 (three) times daily. 90 capsule 3   hydrALAZINE  (APRESOLINE ) 100 MG tablet Take 1 tablet (100 mg total) by mouth 3 (three) times daily. 270 tablet 2   magnesium  oxide (MAG-OX) 400 MG tablet Take 1 tablet (400 mg total) by mouth every other day. While taking daily multivitamin with 100mg  magnesium  every day 90 tablet 3   metoprolol  succinate (TOPROL -XL) 50 MG 24 hr tablet Take 1 tablet (50 mg total) by mouth daily. Take with or immediately following a meal. 90 tablet 3   montelukast  (SINGULAIR ) 10 MG tablet Take 1 tablet (10 mg total) by mouth at bedtime. 90 tablet 3   Multiple Vitamin (MULTIVITAMIN WITH MINERALS) TABS tablet Take 1 tablet by mouth daily.     potassium chloride  SA (KLOR-CON  M) 20 MEQ tablet Take 1 tablet (20 mEq total) by mouth daily as needed (with furosemide ). 60 tablet 2   predniSONE  (DELTASONE ) 10 MG tablet Take 0.5 tablets (5 mg total) by mouth daily with breakfast.     predniSONE  (DELTASONE ) 5 MG tablet Take 1 tablet (5 mg total) by mouth daily. 90 tablet 1   sildenafil  (VIAGRA ) 50  MG tablet Take 1 tablet (50 mg total) by mouth daily as needed for ED 10 tablet 0   sotalol  (BETAPACE ) 120 MG tablet Take 1 tablet (120 mg total) by mouth every 12 (twelve) hours. 180 tablet 2   spironolactone  (ALDACTONE ) 25 MG tablet Take 1 tablet (25 mg total) by mouth daily. 90 tablet 3   furosemide  (LASIX ) 40 MG tablet Take 1 tablet (40 mg total) by mouth daily as needed for fluid or edema (greater than 3lbs daily or 5lbs weekly or leg swelling take potassium with furosemide ). (Patient not taking: Reported on 12/07/2024)     furosemide  (LASIX ) 40 MG tablet Take 1 tablet (40 mg total) by mouth daily. 90 tablet 0   No current  facility-administered medications for this visit.     Past Medical History:  Diagnosis Date   AICD (automatic cardioverter/defibrillator) present    Ascending aortic aneurysm    a. CT 05/2021 showed 4.4x4.4cm TAA. Stable on chest CTA 12/2021 at 4.4cm.   Atrial fibrillation (HCC)    Bilateral lower extremity edema    Celiac artery dilatation    1.8cm at the celiac trunk on Chest CT 12/2021   CHF (congestive heart failure) (HCC)    Chronic gout without tophus    followed by dr ziolkowska    (06-08-2020  per pt last episode right knee 3 wks ago)   CKD (chronic kidney disease), stage II    nephrology--- Ryan Labella PA (10-29-2019 note in epic scanned in  media)   Coronary artery disease    Heart failure with mid-range ejection fraction (HCC) 05/27/2021   05/26/21: Left ventricular ejection fraction, by estimation, is 45 to 50%. There is severe asymmetric left ventricular hypertrophy. G1DD present.    History of COVID-19 06/07/2021   HOCM (hypertrophic obstructive cardiomyopathy) (HCC)    s/p ICD 07/2021   Hypertension    followed by cardiology, dr t. turner   (05-14-2018 nuclear study in epic , normal perfusion with nuclear ef 61%)   Left hydrocele    Low serum potassium level 04/27/2014   OSA on CPAP    per pt uses every night   Palpitations followed by dr t. shlomo   06-08-2020  still feels palipations due to PVCs when exertion but not with chest pain/ discomfort   PVC's (premature ventricular contractions) cardiologist--- dr t. turner   Status post PVC ablation by Dr. Waddell 2019 with recurrence of frequent PVCs/bigeminy;  prior pseudobradycardia r/t pvcs   Rheumatoid arthritis involving multiple sites [facility])    rheumotology--- dr a. ziolkowska  ([facility] in [place])    ROS:   All systems reviewed and negative except as noted in the HPI.   Past Surgical History:  Procedure Laterality Date   BIOPSY  10/08/2021   Procedure: BIOPSY;  Surgeon: Stacia Glendia BRAVO, MD;  Location: [facility]  ENDOSCOPY;  Service: Gastroenterology;;   VASSIE STUDY  08/18/2021   Procedure: BUBBLE STUDY;  Surgeon: Delford Maude BROCKS, MD;  Location: [facility] ENDOSCOPY;  Service: Cardiovascular;;   CATARACT EXTRACTION  04/01/2023   ESOPHAGOGASTRODUODENOSCOPY N/A 10/08/2021   Procedure: ESOPHAGOGASTRODUODENOSCOPY (EGD);  Surgeon: Stacia Glendia BRAVO, MD;  Location: [facility] ENDOSCOPY;  Service: Gastroenterology;  Laterality: N/A;   HYDROCELE EXCISION Left 06/14/2020   Procedure: LEFT  HYDROCELECTOMY ADULT;  Surgeon: Elisabeth Valli BIRCH, MD;  Location: [facility];  Service: Urology;  Laterality: Left;   ICD IMPLANT N/A 08/07/2021   Procedure: ICD IMPLANT;  Surgeon: Waddell Ryan ORN, MD;  Location: [facility] INVASIVE CV  LAB;  Service: Cardiovascular;  Laterality: N/A;   INCISIONAL HERNIA REPAIR  02-23-2016   @[facility]    LAPAROSCOPIC   LAPAROSCOPIC INGUINAL HERNIA REPAIR Bilateral 08-22-2015  @[facility]    AND UMBILICAL HERNIA REPAIR   PVC ABLATION N/A 10/07/2018   Procedure: PVC ABLATION;  Surgeon: Waddell Ryan ORN, MD;  Location: [facility] INVASIVE CV LAB;  Service: Cardiovascular;  Laterality: N/A;   RIGHT/LEFT HEART CATH AND CORONARY ANGIOGRAPHY N/A 05/29/2021   Procedure: RIGHT/LEFT HEART CATH AND CORONARY ANGIOGRAPHY;  Surgeon: Verlin Lonni BIRCH, MD;  Location: [facility] INVASIVE CV LAB;  Service: Cardiovascular;  Laterality: N/A;   TEE WITHOUT CARDIOVERSION N/A 08/18/2021   Procedure: TRANSESOPHAGEAL ECHOCARDIOGRAM (TEE);  Surgeon: Delford Maude BROCKS, MD;  Location: [facility] ENDOSCOPY;  Service: Cardiovascular;  Laterality: N/A;   TOTAL KNEE ARTHROPLASTY Left 04/30/2022   Procedure: LEFT TOTAL KNEE ARTHROPLASTY;  Surgeon: Liam Lerner, MD;  Location: [facility] ORS;  Service: Orthopedics;  Laterality: Left;   TOTAL KNEE ARTHROPLASTY Right 06/18/2022   Procedure: RIGHT TOTAL KNEE ARTHROPLASTY;  Surgeon: Liam Lerner, MD;  Location: [facility] ORS;  Service: Orthopedics;  Laterality: Right;   UMBILICAL HERNIA REPAIR  child     Family History   Problem Relation Age of Onset   Cardiomyopathy Mother    Heart attack Father      Social History   Socioeconomic History   Marital status: Married    Spouse name: Not on file   Number of children: Not on file   Years of education: Not on file   Highest education level: Not on file  Occupational History   Not on file  Tobacco Use   Smoking status: Never    Passive exposure: Past (father smoked as child)   Smokeless tobacco: Never  Vaping Use   Vaping status: Never Used  Substance and Sexual Activity   Alcohol use: Never   Drug use: Never   Sexual activity: Yes    Partners: Female  Other Topics Concern   Not on file  Social History Narrative   Not on file   Social Drivers of Health   Tobacco Use: Low Risk (12/07/2024)   Patient History    Smoking Tobacco Use: Never    Smokeless Tobacco Use: Never    Passive Exposure: Past  Financial Resource Strain: Not on file  Food Insecurity: Not on file  Transportation Needs: Not on file  Physical Activity: Not on file  Stress: Not on file  Social Connections: Not on file  Intimate Partner Violence: Not on file  Depression (PHQ2-9): Low Risk (04/12/2022)   Depression (PHQ2-9)    PHQ-2 Score: 0  Alcohol Screen: Not on file  Housing: Not on file  Utilities: Not on file  Health Literacy: Not on file     BP 116/78   Pulse 76   Ht 5' 10 (1.778 m)   Wt 252 lb (114.3 kg)   SpO2 94%   BMI 36.16 kg/m   Physical Exam:  Well appearing NAD HEENT: Unremarkable Neck:  No JVD, no thyromegally Lymphatics:  No adenopathy Back:  No CVA tenderness Lungs:  Clear with no wheezes HEART:  Regular rate rhythm, no murmurs, no rubs, no clicks Abd:  soft, positive bowel sounds, no organomegally, no rebound, no guarding Ext:  2 plus pulses, no edema, no cyanosis, no clubbing Skin:  No rashes no nodules Neuro:  CN II through XII intact, motor grossly intact  EKG  DEVICE  Normal device function.  See PaceArt for details.    Assess/Plan:  VT - continue sotalol . PVC's - continue sotalol . HCM - he will continue his beta blocker. No chest pain.I asked him to take his lasix  daily as he a little volume overloaded.  Aortic aneurysm - no change in size. He will obtain a repeat CT scan in a year. He has been concerned about the cost and requests to hold off on another exam for an additional few months.   Ryan Greer Koeppen,MD    [1]  Allergies Allergen Reactions   Amlodipine  Besylate Swelling and Other (See Comments)    Leg swelling with 10 mg daily am dosing; decreased with BID 5 mg dosing   Aspirin  Itching   "

## 2024-12-07 NOTE — Patient Instructions (Signed)
 Medication Instructions:  Your physician has recommended you make the following change in your medication:  Increase lasix  40mg   to taking daily.  Lab Work: None ordered.  You may go to any [facility] Location for your lab work:  [facility] - [address] ([facility]) - [address] [phone] [address]  [place] - [address]   [place]  - [address]   [place] - [address] - [address] [place]  - [address] - [address] ([facility]   If you have labs (blood work) drawn today and your tests are completely normal, you will receive your results only by: [employer] (if you have MyChart)  If you have any lab test that is abnormal or we need to change your treatment, we will call you or send a MyChart message to review the results.  Testing/Procedures: None ordered.  Follow-Up: At [facility], you and your health needs are our priority.  As part of our continuing mission to provide you with exceptional heart care, we have created designated Provider Care Teams.  These Care Teams include your primary Cardiologist (physician) and Advanced Practice Providers (APPs -  Physician Assistants and Nurse Practitioners) who all work together to provide you with the care you need, when you need it.  We recommend signing up for the patient portal called MyChart.  Sign up information is provided on this After Visit Summary.  MyChart is used to connect with patients for Virtual Visits (Telemedicine).  Patients are able to view lab/test results, encounter notes, upcoming appointments, etc.  Non-urgent messages can be sent to your provider as well.   To learn more about what you can do with MyChart, go to [email or web address].    Your next appointment:   1 year(s)  The format for your next appointment:   In Person  Provider:   Donnice Primus,  MD or one of the following Advanced Practice Providers on your designated Care Team:   Charlies Arthur, [place] Ozell Jodie Passey, [place] Leotis Barrack, NP  Note: Remote monitoring is used to monitor your Pacemaker/ ICD from home. This monitoring reduces the number of office visits required to check your device to one time per year. It allows us  to keep an eye on the functioning of your device to ensure it is working properly.

## 2024-12-09 NOTE — Telephone Encounter (Signed)
 Pt of Dr. Shlomo. Does Dr. Shlomo want to refill this RX? Please advise.

## 2024-12-11 MED ORDER — SILDENAFIL CITRATE 50 MG PO TABS
50.0000 mg | ORAL_TABLET | Freq: Every day | ORAL | 0 refills | Status: AC | PRN
  Filled 2024-12-11: qty 8, 8d supply, fill #0
  Filled 2024-12-21: qty 10, 10d supply, fill #0

## 2024-12-12 ENCOUNTER — Other Ambulatory Visit (HOSPITAL_COMMUNITY): Payer: Self-pay

## 2024-12-13 ENCOUNTER — Other Ambulatory Visit: Payer: Self-pay | Admitting: Registered Nurse

## 2024-12-13 DIAGNOSIS — Z0000 Encounter for general adult medical examination without abnormal findings: Secondary | ICD-10-CM

## 2024-12-13 MED ORDER — MAGNESIUM OXIDE 400 MG PO TABS
400.0000 mg | ORAL_TABLET | ORAL | 0 refills | Status: AC
  Filled 2024-12-13 – 2024-12-21 (×3): qty 90, 180d supply, fill #0

## 2024-12-14 ENCOUNTER — Other Ambulatory Visit (HOSPITAL_COMMUNITY): Payer: Self-pay

## 2024-12-15 ENCOUNTER — Other Ambulatory Visit: Payer: Self-pay

## 2024-12-15 NOTE — Telephone Encounter (Signed)
 Patient last filled atorvastatin  40mg  po daily #90 09/29/2024 dispensed 90 tabs today labs pending appt patient stated he would schedule fasting with RN Karene.  Recent appt with cardiology Dr Waddell continue atorvastatin  40mg  po daily and increase furosemide  to daily as still fluid overloaded.  Patient stated he does not need furosemide  or potassium at this time.

## 2024-12-17 ENCOUNTER — Other Ambulatory Visit (HOSPITAL_COMMUNITY): Payer: Self-pay

## 2024-12-18 ENCOUNTER — Other Ambulatory Visit: Payer: Self-pay

## 2024-12-21 ENCOUNTER — Other Ambulatory Visit: Payer: Self-pay | Admitting: General Practice

## 2024-12-21 ENCOUNTER — Ambulatory Visit: Admitting: Family Medicine

## 2024-12-21 ENCOUNTER — Other Ambulatory Visit: Payer: Self-pay

## 2024-12-21 ENCOUNTER — Other Ambulatory Visit (HOSPITAL_COMMUNITY): Payer: Self-pay

## 2024-12-22 ENCOUNTER — Other Ambulatory Visit: Payer: Self-pay

## 2024-12-22 ENCOUNTER — Other Ambulatory Visit (HOSPITAL_COMMUNITY): Payer: Self-pay

## 2024-12-23 ENCOUNTER — Other Ambulatory Visit (HOSPITAL_COMMUNITY): Payer: Self-pay

## 2024-12-24 ENCOUNTER — Other Ambulatory Visit: Admitting: General Practice

## 2024-12-24 ENCOUNTER — Other Ambulatory Visit: Payer: Self-pay | Admitting: Registered Nurse

## 2024-12-24 DIAGNOSIS — Z0000 Encounter for general adult medical examination without abnormal findings: Secondary | ICD-10-CM

## 2024-12-24 NOTE — Progress Notes (Signed)
 Ryan Glass presents to the clinic this am for Preventative Health encounter. Lab work drawn, will send to [employer] this afternoon and review results with him.

## 2024-12-25 LAB — CMP12+LP+TP+TSH+6AC+PSA+CBC…
ALT: 33 IU/L (ref 0–44)
AST: 29 IU/L (ref 0–40)
Albumin: 4.6 g/dL (ref 3.9–4.9)
Alkaline Phosphatase: 91 IU/L (ref 47–123)
BUN/Creatinine Ratio: 25 — ABNORMAL HIGH (ref 10–24)
BUN: 35 mg/dL — ABNORMAL HIGH (ref 8–27)
Basophils Absolute: 0.1 x10E3/uL (ref 0.0–0.2)
Basos: 1 %
Bilirubin Total: 0.6 mg/dL (ref 0.0–1.2)
Calcium: 9.8 mg/dL (ref 8.6–10.2)
Chloride: 106 mmol/L (ref 96–106)
Chol/HDL Ratio: 2.9 ratio (ref 0.0–5.0)
Cholesterol, Total: 129 mg/dL (ref 100–199)
Creatinine, Ser: 1.38 mg/dL — ABNORMAL HIGH (ref 0.76–1.27)
EOS (ABSOLUTE): 0.1 x10E3/uL (ref 0.0–0.4)
Eos: 1 %
Free Thyroxine Index: 1.7 (ref 1.2–4.9)
GGT: 31 IU/L (ref 0–65)
Globulin, Total: 1.9 g/dL (ref 1.5–4.5)
Glucose: 78 mg/dL (ref 70–99)
HDL: 45 mg/dL
Hematocrit: 49.4 % (ref 37.5–51.0)
Hemoglobin: 16.4 g/dL (ref 13.0–17.7)
Immature Grans (Abs): 0 x10E3/uL (ref 0.0–0.1)
Immature Granulocytes: 0 %
Iron: 129 ug/dL (ref 38–169)
LDH: 169 IU/L (ref 121–224)
LDL Chol Calc (NIH): 60 mg/dL (ref 0–99)
Lymphocytes Absolute: 1.5 x10E3/uL (ref 0.7–3.1)
Lymphs: 24 %
MCH: 33.1 pg — ABNORMAL HIGH (ref 26.6–33.0)
MCHC: 33.2 g/dL (ref 31.5–35.7)
MCV: 100 fL — ABNORMAL HIGH (ref 79–97)
Monocytes Absolute: 0.4 x10E3/uL (ref 0.1–0.9)
Monocytes: 7 %
Neutrophils Absolute: 4.3 x10E3/uL (ref 1.4–7.0)
Neutrophils: 67 %
Phosphorus: 4.1 mg/dL (ref 2.8–4.1)
Platelets: 198 x10E3/uL (ref 150–450)
Potassium: 4.2 mmol/L (ref 3.5–5.2)
Prostate Specific Ag, Serum: 1.4 ng/mL (ref 0.0–4.0)
RBC: 4.95 x10E6/uL (ref 4.14–5.80)
RDW: 12 % (ref 11.6–15.4)
Sodium: 143 mmol/L (ref 134–144)
T3 Uptake Ratio: 26 % (ref 24–39)
T4, Total: 6.5 ug/dL (ref 4.5–12.0)
TSH: 2.07 u[IU]/mL (ref 0.450–4.500)
Total Protein: 6.5 g/dL (ref 6.0–8.5)
Triglycerides: 140 mg/dL (ref 0–149)
Uric Acid: 8.5 mg/dL — ABNORMAL HIGH (ref 3.8–8.4)
VLDL Cholesterol Cal: 24 mg/dL (ref 5–40)
WBC: 6.4 x10E3/uL (ref 3.4–10.8)
eGFR: 56 mL/min/1.73 — ABNORMAL LOW

## 2024-12-25 LAB — HGB A1C W/O EAG: Hgb A1c MFr Bld: 5.4 % (ref 4.8–5.6)

## 2024-12-25 LAB — MAGNESIUM: Magnesium: 2.3 mg/dL (ref 1.6–2.3)

## 2024-12-26 ENCOUNTER — Ambulatory Visit: Payer: Self-pay | Admitting: Registered Nurse

## 2024-12-26 DIAGNOSIS — N1831 Chronic kidney disease, stage 3a: Secondary | ICD-10-CM

## 2024-12-26 DIAGNOSIS — E790 Hyperuricemia without signs of inflammatory arthritis and tophaceous disease: Secondary | ICD-10-CM

## 2024-12-29 ENCOUNTER — Telehealth: Payer: Self-pay | Admitting: Registered Nurse

## 2024-12-29 ENCOUNTER — Ambulatory Visit: Admitting: Family Medicine

## 2024-12-29 ENCOUNTER — Encounter: Payer: Self-pay | Admitting: Registered Nurse

## 2024-12-29 DIAGNOSIS — Z0000 Encounter for general adult medical examination without abnormal findings: Secondary | ICD-10-CM

## 2024-12-29 DIAGNOSIS — E876 Hypokalemia: Secondary | ICD-10-CM

## 2024-12-29 DIAGNOSIS — M0609 Rheumatoid arthritis without rheumatoid factor, multiple sites: Secondary | ICD-10-CM

## 2024-12-29 NOTE — Telephone Encounter (Signed)
 Patient wanted to discuss results in detail and verify PSA results.  1.4 this year 1.4 last year 1.2 2024 per epic review  Discussed elevated uric acid level patient stated he can tell when uric acid level is elevated as feet get bumps also.  Had taken furosemide  every day for 2 weeks due to increased weight but now back to prn with potassium chloride .  Patient stated read my chart results note and had no further questions after discussion today

## 2024-12-29 NOTE — Telephone Encounter (Signed)
 Patient last filled potassium chloride  20meq po daily prn take if taking furosemide  that day #60 on 10/08/2024 and prednisone  10mg  sig po daily take 1/2 pill #21 10/27/2024  Dispensed 90 tabs potassium chloride  ER to patient from [facility] today and prednisone  10mg  sig t1/2 po daily #42 from [facility] today.  Last labs 12/25/2024 stable  Stated not needing furosemide  refilled at this time still has bottle at home and not taking every day at this time.  Pedal edema trace today at sock line.  Typically drinking 1 and 1/2 of his yeti insulated cups per day of water .  Patient had no further questions at this time.  Next labs due in 1 year.  Provider and patient have signed [employer] forms for insurance discount starting 09 Sep 2025 negative tobacco attestation completed for previous 12 months.  LDL less than 130 and Hgba1c less than 7   Latest Reference Range & Units 12/24/24 08:30  Sodium 134 - 144 mmol/L 143  Potassium 3.5 - 5.2 mmol/L 4.2  Chloride 96 - 106 mmol/L 106  Glucose 70 - 99 mg/dL 78  BUN 8 - 27 mg/dL 35 (H)  Creatinine 9.23 - 1.27 mg/dL 8.61 (H)  Calcium  8.6 - 10.2 mg/dL 9.8  BUN/Creatinine Ratio 10 - 24  25 (H)  eGFR >59 mL/min/1.73 56 (L)  Phosphorus 2.8 - 4.1 mg/dL 4.1  Magnesium  1.6 - 2.3 mg/dL 2.3  Alkaline Phosphatase 47 - 123 IU/L 91  Albumin  3.9 - 4.9 g/dL 4.6  Uric Acid 3.8 - 8.4 mg/dL 8.5 (H)  AST 0 - 40 IU/L 29  ALT 0 - 44 IU/L 33  Total Protein 6.0 - 8.5 g/dL 6.5  Total Bilirubin 0.0 - 1.2 mg/dL 0.6  GGT 0 - 65 IU/L 31  Estimated CHD Risk 0.0 - 1.0 times avg.  < 0.5  LDH 121 - 224 IU/L 169  Total CHOL/HDL Ratio 0.0 - 5.0 ratio 2.9  Cholesterol, Total 100 - 199 mg/dL 870  HDL Cholesterol >60 mg/dL 45  Triglycerides 0 - 850 mg/dL 859  VLDL Cholesterol Cal 5 - 40 mg/dL 24  LDL Chol Calc (NIH) 0 - 99 mg/dL 60  Iron 38 - 830 ug/dL 870  Globulin, Total 1.5 - 4.5 g/dL 1.9  WBC 3.4 - 89.1 [id number] 6.4  RBC 4.14 - 5.80 x10E6/uL 4.95  Hemoglobin 13.0 - 17.7 g/dL 83.5  HCT 62.4  - 48.9 % 49.4  MCV 79 - 97 fL 100 (H)  MCH 26.6 - 33.0 pg 33.1 (H)  MCHC 31.5 - 35.7 g/dL 66.7  RDW 88.3 - 84.5 % 12.0  Platelets 150 - 450 x10E3/uL 198  Neutrophils Not Estab. % 67  Immature Granulocytes Not Estab. % 0  NEUT# 1.4 - 7.0 x10E3/uL 4.3  Lymphs Abs 0.7 - 3.1 x10E3/uL 1.5  Monocytes Absolute 0.1 - 0.9 x10E3/uL 0.4  Basophils Absolute 0.0 - 0.2 x10E3/uL 0.1  Immature Grans (Abs) 0.0 - 0.1 x10E3/uL 0.0  Lymphs Not Estab. % 24  Monocytes Not Estab. % 7  Basos Not Estab. % 1  Eos Not Estab. % 1  EOS (ABSOLUTE) 0.0 - 0.4 x10E3/uL 0.1  Hemoglobin A1C 4.8 - 5.6 % 5.4  TSH 0.450 - 4.500 uIU/mL 2.070  Thyroxine (T4) 4.5 - 12.0 ug/dL 6.5  Free Thyroxine Index 1.2 - 4.9  1.7  T3 Uptake Ratio 24 - 39 % 26  (H): Data is abnormally high (L): Data is abnormally low

## 2024-12-31 NOTE — Telephone Encounter (Signed)
 Patient seen 12/29/24 trace pedal edema and labs completed last week

## 2025-01-07 ENCOUNTER — Other Ambulatory Visit (HOSPITAL_COMMUNITY): Payer: Self-pay

## 2025-01-07 ENCOUNTER — Other Ambulatory Visit: Payer: Self-pay

## 2025-01-07 MED ORDER — COLCHICINE 0.6 MG PO TABS: 0.6000 mg | ORAL_TABLET | Freq: Two times a day (BID) | ORAL | 1 refills | Status: AC | PRN

## 2025-01-08 ENCOUNTER — Other Ambulatory Visit (HOSPITAL_COMMUNITY): Payer: Self-pay

## 2025-01-08 ENCOUNTER — Ambulatory Visit (HOSPITAL_COMMUNITY)
Admission: RE | Admit: 2025-01-08 | Discharge: 2025-01-08 | Disposition: A | Source: Ambulatory Visit | Attending: Physician Assistant | Admitting: Physician Assistant

## 2025-01-08 DIAGNOSIS — I422 Other hypertrophic cardiomyopathy: Secondary | ICD-10-CM | POA: Diagnosis present

## 2025-01-08 LAB — ECHOCARDIOGRAM COMPLETE: S' Lateral: 3.44 cm

## 2025-01-14 ENCOUNTER — Telehealth: Payer: Self-pay | Admitting: Pharmacy Technician

## 2025-01-14 ENCOUNTER — Other Ambulatory Visit (HOSPITAL_COMMUNITY): Payer: Self-pay

## 2025-01-14 NOTE — Telephone Encounter (Signed)
" ° °  [id number] CNTL [id number] [id number]  No proir auth needed. Just cost exceed max override the pharmacy will have to do once the prescription is sent in    "
